# Patient Record
Sex: Female | Born: 1950 | Race: Black or African American | Hispanic: No | Marital: Married | Smoking: Former smoker
Health system: Southern US, Community
[De-identification: ages and names within clinical notes are randomized; demographics above are authoritative.]

## PROBLEM LIST (undated history)

## (undated) DIAGNOSIS — I2510 Atherosclerotic heart disease of native coronary artery without angina pectoris: Secondary | ICD-10-CM

## (undated) DIAGNOSIS — F0390 Unspecified dementia without behavioral disturbance: Secondary | ICD-10-CM

## (undated) DIAGNOSIS — I219 Acute myocardial infarction, unspecified: Secondary | ICD-10-CM

## (undated) DIAGNOSIS — J40 Bronchitis, not specified as acute or chronic: Secondary | ICD-10-CM

## (undated) DIAGNOSIS — K219 Gastro-esophageal reflux disease without esophagitis: Secondary | ICD-10-CM

## (undated) DIAGNOSIS — E785 Hyperlipidemia, unspecified: Secondary | ICD-10-CM

## (undated) DIAGNOSIS — Z87898 Personal history of other specified conditions: Secondary | ICD-10-CM

## (undated) DIAGNOSIS — M1990 Unspecified osteoarthritis, unspecified site: Secondary | ICD-10-CM

## (undated) DIAGNOSIS — I214 Non-ST elevation (NSTEMI) myocardial infarction: Secondary | ICD-10-CM

## (undated) DIAGNOSIS — I119 Hypertensive heart disease without heart failure: Secondary | ICD-10-CM

## (undated) DIAGNOSIS — N189 Chronic kidney disease, unspecified: Secondary | ICD-10-CM

## (undated) DIAGNOSIS — F329 Major depressive disorder, single episode, unspecified: Secondary | ICD-10-CM

## (undated) DIAGNOSIS — I10 Essential (primary) hypertension: Secondary | ICD-10-CM

## (undated) DIAGNOSIS — J189 Pneumonia, unspecified organism: Secondary | ICD-10-CM

## (undated) DIAGNOSIS — F32A Depression, unspecified: Secondary | ICD-10-CM

## (undated) DIAGNOSIS — N183 Chronic kidney disease, stage 3 unspecified: Secondary | ICD-10-CM

## (undated) DIAGNOSIS — F419 Anxiety disorder, unspecified: Secondary | ICD-10-CM

## (undated) DIAGNOSIS — I5032 Chronic diastolic (congestive) heart failure: Secondary | ICD-10-CM

## (undated) HISTORY — PX: KNEE ARTHROSCOPY: SHX127

## (undated) HISTORY — DX: Acute myocardial infarction, unspecified: I21.9

## (undated) HISTORY — PX: FOOT SURGERY: SHX648

## (undated) HISTORY — PX: HERNIA REPAIR: SHX51

## (undated) HISTORY — PX: HEMORRHOID SURGERY: SHX153

## (undated) HISTORY — PX: ABDOMINAL HYSTERECTOMY: SHX81

---

## 1992-05-01 HISTORY — PX: CARDIAC CATHETERIZATION: SHX172

## 1998-02-28 ENCOUNTER — Emergency Department (HOSPITAL_COMMUNITY): Admission: EM | Admit: 1998-02-28 | Discharge: 1998-02-28 | Payer: Self-pay | Admitting: Emergency Medicine

## 1998-03-03 ENCOUNTER — Inpatient Hospital Stay (HOSPITAL_COMMUNITY): Admission: EM | Admit: 1998-03-03 | Discharge: 1998-03-05 | Payer: Self-pay | Admitting: Emergency Medicine

## 1998-03-03 ENCOUNTER — Encounter: Payer: Self-pay | Admitting: Emergency Medicine

## 1998-08-11 ENCOUNTER — Emergency Department (HOSPITAL_COMMUNITY): Admission: EM | Admit: 1998-08-11 | Discharge: 1998-08-11 | Payer: Self-pay | Admitting: Emergency Medicine

## 1998-08-12 ENCOUNTER — Encounter: Payer: Self-pay | Admitting: Family Medicine

## 1998-08-12 ENCOUNTER — Inpatient Hospital Stay (HOSPITAL_COMMUNITY): Admission: EM | Admit: 1998-08-12 | Discharge: 1998-08-14 | Payer: Self-pay | Admitting: Family Medicine

## 1998-08-13 ENCOUNTER — Encounter: Payer: Self-pay | Admitting: Internal Medicine

## 1998-12-14 ENCOUNTER — Other Ambulatory Visit: Admission: RE | Admit: 1998-12-14 | Discharge: 1998-12-14 | Payer: Self-pay | Admitting: *Deleted

## 1999-03-23 ENCOUNTER — Encounter: Payer: Self-pay | Admitting: Emergency Medicine

## 1999-03-23 ENCOUNTER — Inpatient Hospital Stay (HOSPITAL_COMMUNITY): Admission: EM | Admit: 1999-03-23 | Discharge: 1999-03-26 | Payer: Self-pay | Admitting: Emergency Medicine

## 1999-03-25 ENCOUNTER — Encounter: Payer: Self-pay | Admitting: Family Medicine

## 1999-04-27 ENCOUNTER — Ambulatory Visit: Admission: RE | Admit: 1999-04-27 | Discharge: 1999-04-27 | Payer: Self-pay | Admitting: Pulmonary Disease

## 1999-07-14 ENCOUNTER — Inpatient Hospital Stay (HOSPITAL_COMMUNITY): Admission: EM | Admit: 1999-07-14 | Discharge: 1999-07-19 | Payer: Self-pay | Admitting: *Deleted

## 1999-07-14 ENCOUNTER — Encounter: Payer: Self-pay | Admitting: *Deleted

## 1999-11-23 ENCOUNTER — Ambulatory Visit (HOSPITAL_COMMUNITY): Admission: RE | Admit: 1999-11-23 | Discharge: 1999-11-23 | Payer: Self-pay | Admitting: Gastroenterology

## 1999-12-19 ENCOUNTER — Other Ambulatory Visit: Admission: RE | Admit: 1999-12-19 | Discharge: 1999-12-19 | Payer: Self-pay | Admitting: *Deleted

## 2000-07-17 ENCOUNTER — Encounter: Payer: Self-pay | Admitting: *Deleted

## 2000-07-17 ENCOUNTER — Inpatient Hospital Stay (HOSPITAL_COMMUNITY): Admission: EM | Admit: 2000-07-17 | Discharge: 2000-07-21 | Payer: Self-pay | Admitting: Emergency Medicine

## 2000-07-30 ENCOUNTER — Encounter: Payer: Self-pay | Admitting: Emergency Medicine

## 2000-07-30 ENCOUNTER — Inpatient Hospital Stay (HOSPITAL_COMMUNITY): Admission: EM | Admit: 2000-07-30 | Discharge: 2000-08-06 | Payer: Self-pay | Admitting: Emergency Medicine

## 2000-08-07 ENCOUNTER — Inpatient Hospital Stay (HOSPITAL_COMMUNITY): Admission: EM | Admit: 2000-08-07 | Discharge: 2000-08-10 | Payer: Self-pay | Admitting: *Deleted

## 2000-12-27 ENCOUNTER — Other Ambulatory Visit: Admission: RE | Admit: 2000-12-27 | Discharge: 2000-12-27 | Payer: Self-pay | Admitting: *Deleted

## 2001-10-15 ENCOUNTER — Inpatient Hospital Stay (HOSPITAL_COMMUNITY): Admission: EM | Admit: 2001-10-15 | Discharge: 2001-10-20 | Payer: Self-pay

## 2001-10-15 ENCOUNTER — Encounter: Payer: Self-pay | Admitting: Internal Medicine

## 2001-10-17 ENCOUNTER — Encounter: Payer: Self-pay | Admitting: Internal Medicine

## 2002-01-08 ENCOUNTER — Other Ambulatory Visit: Admission: RE | Admit: 2002-01-08 | Discharge: 2002-01-08 | Payer: Self-pay | Admitting: *Deleted

## 2002-01-19 ENCOUNTER — Inpatient Hospital Stay (HOSPITAL_COMMUNITY): Admission: EM | Admit: 2002-01-19 | Discharge: 2002-01-24 | Payer: Self-pay | Admitting: Emergency Medicine

## 2002-01-19 ENCOUNTER — Encounter: Payer: Self-pay | Admitting: Emergency Medicine

## 2002-02-11 ENCOUNTER — Ambulatory Visit (HOSPITAL_COMMUNITY): Admission: RE | Admit: 2002-02-11 | Discharge: 2002-02-11 | Payer: Self-pay | Admitting: *Deleted

## 2002-02-26 ENCOUNTER — Ambulatory Visit (HOSPITAL_BASED_OUTPATIENT_CLINIC_OR_DEPARTMENT_OTHER): Admission: RE | Admit: 2002-02-26 | Discharge: 2002-02-26 | Payer: Self-pay | Admitting: Family Medicine

## 2003-01-07 ENCOUNTER — Emergency Department (HOSPITAL_COMMUNITY): Admission: EM | Admit: 2003-01-07 | Discharge: 2003-01-07 | Payer: Self-pay | Admitting: Emergency Medicine

## 2003-01-07 ENCOUNTER — Encounter: Payer: Self-pay | Admitting: Emergency Medicine

## 2003-02-14 ENCOUNTER — Encounter: Payer: Self-pay | Admitting: Emergency Medicine

## 2003-02-14 ENCOUNTER — Inpatient Hospital Stay (HOSPITAL_COMMUNITY): Admission: EM | Admit: 2003-02-14 | Discharge: 2003-02-17 | Payer: Self-pay | Admitting: Emergency Medicine

## 2003-02-17 ENCOUNTER — Encounter: Payer: Self-pay | Admitting: Internal Medicine

## 2003-05-31 ENCOUNTER — Inpatient Hospital Stay (HOSPITAL_COMMUNITY): Admission: EM | Admit: 2003-05-31 | Discharge: 2003-06-13 | Payer: Self-pay | Admitting: Emergency Medicine

## 2003-06-12 ENCOUNTER — Encounter: Payer: Self-pay | Admitting: Pulmonary Disease

## 2004-02-25 ENCOUNTER — Ambulatory Visit (HOSPITAL_COMMUNITY): Admission: RE | Admit: 2004-02-25 | Discharge: 2004-02-25 | Payer: Self-pay | Admitting: General Surgery

## 2004-04-30 ENCOUNTER — Emergency Department (HOSPITAL_COMMUNITY): Admission: EM | Admit: 2004-04-30 | Discharge: 2004-04-30 | Payer: Self-pay | Admitting: Emergency Medicine

## 2005-11-09 ENCOUNTER — Emergency Department (HOSPITAL_COMMUNITY): Admission: EM | Admit: 2005-11-09 | Discharge: 2005-11-09 | Payer: Self-pay | Admitting: *Deleted

## 2006-09-16 ENCOUNTER — Inpatient Hospital Stay (HOSPITAL_COMMUNITY): Admission: EM | Admit: 2006-09-16 | Discharge: 2006-09-18 | Payer: Self-pay | Admitting: Emergency Medicine

## 2008-06-18 ENCOUNTER — Inpatient Hospital Stay (HOSPITAL_COMMUNITY): Admission: EM | Admit: 2008-06-18 | Discharge: 2008-06-24 | Payer: Self-pay | Admitting: Emergency Medicine

## 2008-07-03 ENCOUNTER — Ambulatory Visit: Payer: Self-pay | Admitting: Critical Care Medicine

## 2008-07-03 ENCOUNTER — Inpatient Hospital Stay (HOSPITAL_COMMUNITY): Admission: EM | Admit: 2008-07-03 | Discharge: 2008-07-09 | Payer: Self-pay | Admitting: Emergency Medicine

## 2008-07-21 DIAGNOSIS — F418 Other specified anxiety disorders: Secondary | ICD-10-CM | POA: Insufficient documentation

## 2008-07-21 DIAGNOSIS — IMO0002 Reserved for concepts with insufficient information to code with codable children: Secondary | ICD-10-CM | POA: Insufficient documentation

## 2008-07-21 DIAGNOSIS — E1149 Type 2 diabetes mellitus with other diabetic neurological complication: Secondary | ICD-10-CM | POA: Insufficient documentation

## 2008-07-21 DIAGNOSIS — I10 Essential (primary) hypertension: Secondary | ICD-10-CM | POA: Insufficient documentation

## 2008-07-21 DIAGNOSIS — E1165 Type 2 diabetes mellitus with hyperglycemia: Secondary | ICD-10-CM | POA: Insufficient documentation

## 2008-07-21 DIAGNOSIS — Z86718 Personal history of other venous thrombosis and embolism: Secondary | ICD-10-CM | POA: Insufficient documentation

## 2008-07-21 DIAGNOSIS — K219 Gastro-esophageal reflux disease without esophagitis: Secondary | ICD-10-CM | POA: Insufficient documentation

## 2008-07-21 DIAGNOSIS — J4530 Mild persistent asthma, uncomplicated: Secondary | ICD-10-CM | POA: Insufficient documentation

## 2008-07-21 DIAGNOSIS — E7800 Pure hypercholesterolemia, unspecified: Secondary | ICD-10-CM | POA: Insufficient documentation

## 2008-07-22 ENCOUNTER — Ambulatory Visit: Payer: Self-pay | Admitting: Pulmonary Disease

## 2008-07-22 DIAGNOSIS — J329 Chronic sinusitis, unspecified: Secondary | ICD-10-CM | POA: Insufficient documentation

## 2008-07-22 DIAGNOSIS — J309 Allergic rhinitis, unspecified: Secondary | ICD-10-CM | POA: Insufficient documentation

## 2008-07-22 DIAGNOSIS — J383 Other diseases of vocal cords: Secondary | ICD-10-CM | POA: Insufficient documentation

## 2008-07-22 DIAGNOSIS — R0602 Shortness of breath: Secondary | ICD-10-CM | POA: Insufficient documentation

## 2009-06-01 ENCOUNTER — Inpatient Hospital Stay: Payer: Self-pay | Admitting: Internal Medicine

## 2009-06-24 ENCOUNTER — Ambulatory Visit (HOSPITAL_COMMUNITY): Admission: RE | Admit: 2009-06-24 | Discharge: 2009-06-24 | Payer: Self-pay | Admitting: Cardiology

## 2009-06-24 ENCOUNTER — Encounter (INDEPENDENT_AMBULATORY_CARE_PROVIDER_SITE_OTHER): Payer: Self-pay | Admitting: Cardiology

## 2010-07-22 LAB — GLUCOSE, CAPILLARY: Glucose-Capillary: 100 mg/dL — ABNORMAL HIGH (ref 70–99)

## 2010-08-11 LAB — DIFFERENTIAL
Basophils Absolute: 0 10*3/uL (ref 0.0–0.1)
Basophils Relative: 0 % (ref 0–1)
Lymphocytes Relative: 13 % (ref 12–46)
Neutro Abs: 8.5 10*3/uL — ABNORMAL HIGH (ref 1.7–7.7)
Neutrophils Relative %: 81 % — ABNORMAL HIGH (ref 43–77)

## 2010-08-11 LAB — CBC
HCT: 32.7 % — ABNORMAL LOW (ref 36.0–46.0)
Hemoglobin: 10.9 g/dL — ABNORMAL LOW (ref 12.0–15.0)
MCHC: 33.2 g/dL (ref 30.0–36.0)
MCV: 96.2 fL (ref 78.0–100.0)
Platelets: 241 10*3/uL (ref 150–400)
RBC: 3.4 MIL/uL — ABNORMAL LOW (ref 3.87–5.11)
RDW: 14.3 % (ref 11.5–15.5)
WBC: 10.5 10*3/uL (ref 4.0–10.5)

## 2010-08-11 LAB — BASIC METABOLIC PANEL WITH GFR
BUN: 14 mg/dL (ref 6–23)
BUN: 18 mg/dL (ref 6–23)
CO2: 22 meq/L (ref 19–32)
CO2: 26 meq/L (ref 19–32)
Calcium: 7.2 mg/dL — ABNORMAL LOW (ref 8.4–10.5)
Calcium: 8.9 mg/dL (ref 8.4–10.5)
Chloride: 101 meq/L (ref 96–112)
Chloride: 108 meq/L (ref 96–112)
Creatinine, Ser: 0.91 mg/dL (ref 0.4–1.2)
Creatinine, Ser: 1 mg/dL (ref 0.4–1.2)
GFR calc non Af Amer: 57 mL/min — ABNORMAL LOW
GFR calc non Af Amer: >60 mL/min
Glucose, Bld: 142 mg/dL — ABNORMAL HIGH (ref 70–99)
Glucose, Bld: 170 mg/dL — ABNORMAL HIGH (ref 70–99)
Potassium: 3 meq/L — ABNORMAL LOW (ref 3.5–5.1)
Potassium: 4 meq/L (ref 3.5–5.1)
Sodium: 135 meq/L (ref 135–145)
Sodium: 138 meq/L (ref 135–145)

## 2010-08-11 LAB — GLUCOSE, CAPILLARY
Glucose-Capillary: 106 mg/dL — ABNORMAL HIGH (ref 70–99)
Glucose-Capillary: 141 mg/dL — ABNORMAL HIGH (ref 70–99)
Glucose-Capillary: 142 mg/dL — ABNORMAL HIGH (ref 70–99)
Glucose-Capillary: 151 mg/dL — ABNORMAL HIGH (ref 70–99)
Glucose-Capillary: 157 mg/dL — ABNORMAL HIGH (ref 70–99)
Glucose-Capillary: 161 mg/dL — ABNORMAL HIGH (ref 70–99)
Glucose-Capillary: 196 mg/dL — ABNORMAL HIGH (ref 70–99)
Glucose-Capillary: 265 mg/dL — ABNORMAL HIGH (ref 70–99)
Glucose-Capillary: 98 mg/dL (ref 70–99)

## 2010-08-11 LAB — CK TOTAL AND CKMB (NOT AT ARMC)
CK, MB: 1.2 ng/mL (ref 0.3–4.0)
Relative Index: INVALID (ref 0.0–2.5)
Total CK: 33 U/L (ref 7–177)

## 2010-08-11 LAB — BASIC METABOLIC PANEL
BUN: 22 mg/dL (ref 6–23)
BUN: 22 mg/dL (ref 6–23)
BUN: 27 mg/dL — ABNORMAL HIGH (ref 6–23)
CO2: 27 mEq/L (ref 19–32)
CO2: 27 mEq/L (ref 19–32)
Calcium: 8.6 mg/dL (ref 8.4–10.5)
Chloride: 100 mEq/L (ref 96–112)
Chloride: 102 mEq/L (ref 96–112)
Chloride: 98 mEq/L (ref 96–112)
Creatinine, Ser: 0.92 mg/dL (ref 0.4–1.2)
Creatinine, Ser: 0.93 mg/dL (ref 0.4–1.2)
GFR calc Af Amer: >60 mL/min (ref >60)
GFR calc non Af Amer: >60 mL/min (ref >60)
Glucose, Bld: 120 mg/dL — ABNORMAL HIGH (ref 70–99)
Potassium: 3.5 mEq/L (ref 3.5–5.1)
Potassium: 3.9 mEq/L (ref 3.5–5.1)
Sodium: 134 mEq/L — ABNORMAL LOW (ref 135–145)

## 2010-08-11 LAB — BLOOD GAS, ARTERIAL
Acid-Base Excess: 0.8 mmol/L (ref 0.0–2.0)
Bicarbonate: 26.3 meq/L — ABNORMAL HIGH (ref 20.0–24.0)
Drawn by: 235321
FIO2: 0.21 %
O2 Saturation: 66.2 %
Patient temperature: 98.6
TCO2: 23.7 mmol/L (ref 0–100)
pCO2 arterial: 47.8 mmHg — ABNORMAL HIGH (ref 35.0–45.0)
pH, Arterial: 7.36 (ref 7.350–7.400)
pO2, Arterial: 36.9 mmHg — CL (ref 80.0–100.0)

## 2010-08-11 LAB — CULTURE, BLOOD (ROUTINE X 2)

## 2010-08-11 LAB — BRAIN NATRIURETIC PEPTIDE: Pro B Natriuretic peptide (BNP): <30 pg/mL (ref 0.0–100.0)

## 2010-08-16 LAB — CBC
Platelets: 274 10*3/uL (ref 150–400)
RDW: 13 % (ref 11.5–15.5)
WBC: 6 10*3/uL (ref 4.0–10.5)

## 2010-08-16 LAB — GLUCOSE, CAPILLARY
Glucose-Capillary: 140 mg/dL — ABNORMAL HIGH (ref 70–99)
Glucose-Capillary: 141 mg/dL — ABNORMAL HIGH (ref 70–99)
Glucose-Capillary: 144 mg/dL — ABNORMAL HIGH (ref 70–99)
Glucose-Capillary: 150 mg/dL — ABNORMAL HIGH (ref 70–99)
Glucose-Capillary: 154 mg/dL — ABNORMAL HIGH (ref 70–99)
Glucose-Capillary: 168 mg/dL — ABNORMAL HIGH (ref 70–99)
Glucose-Capillary: 172 mg/dL — ABNORMAL HIGH (ref 70–99)
Glucose-Capillary: 178 mg/dL — ABNORMAL HIGH (ref 70–99)
Glucose-Capillary: 180 mg/dL — ABNORMAL HIGH (ref 70–99)
Glucose-Capillary: 197 mg/dL — ABNORMAL HIGH (ref 70–99)
Glucose-Capillary: 216 mg/dL — ABNORMAL HIGH (ref 70–99)

## 2010-08-16 LAB — POCT I-STAT 3, ART BLOOD GAS (G3+)
Patient temperature: 97
pH, Arterial: 7.472 — ABNORMAL HIGH (ref 7.350–7.400)
pO2, Arterial: 132 mmHg — ABNORMAL HIGH (ref 80.0–100.0)

## 2010-08-16 LAB — BRAIN NATRIURETIC PEPTIDE: Pro B Natriuretic peptide (BNP): <30 pg/mL (ref 0.0–100.0)

## 2010-08-16 LAB — BASIC METABOLIC PANEL
BUN: 15 mg/dL (ref 6–23)
BUN: 22 mg/dL (ref 6–23)
Chloride: 96 mEq/L (ref 96–112)
Creatinine, Ser: 0.85 mg/dL (ref 0.4–1.2)
Creatinine, Ser: 0.87 mg/dL (ref 0.4–1.2)
GFR calc Af Amer: >60 mL/min (ref >60)
GFR calc non Af Amer: >60 mL/min (ref >60)
Glucose, Bld: 154 mg/dL — ABNORMAL HIGH (ref 70–99)
Potassium: 3.8 mEq/L (ref 3.5–5.1)
Potassium: 4.1 mEq/L (ref 3.5–5.1)

## 2010-08-16 LAB — POCT I-STAT, CHEM 8
Calcium, Ion: 0.98 mmol/L — ABNORMAL LOW (ref 1.12–1.32)
Chloride: 106 mEq/L (ref 96–112)
HCT: 40 % (ref 36.0–46.0)
Potassium: 2.6 mEq/L — CL (ref 3.5–5.1)
Sodium: 140 mEq/L (ref 135–145)

## 2010-08-16 LAB — HEMOGLOBIN A1C: Hgb A1c MFr Bld: 5.9 % (ref 4.6–6.1)

## 2010-08-16 LAB — DIFFERENTIAL
Basophils Absolute: 0 10*3/uL (ref 0.0–0.1)
Lymphocytes Relative: 35 % (ref 12–46)
Lymphs Abs: 2.1 10*3/uL (ref 0.7–4.0)
Neutro Abs: 3.3 10*3/uL (ref 1.7–7.7)
Neutrophils Relative %: 55 % (ref 43–77)

## 2010-09-13 NOTE — Discharge Summary (Signed)
[name], [name]                ACCOUNT NO.:  [id number]   MEDICAL RECORD NO.:  [id number]          PATIENT TYPE:  INP   LOCATION:  5502                         FACILITY:  [facility]   PHYSICIAN:  Corinna L. Lendell Caprice, MDDATE OF BIRTH:  [birth year]   DATE OF ADMISSION:  06/18/2008  DATE OF DISCHARGE:  06/24/2008                               DISCHARGE SUMMARY   DISCHARGE DIAGNOSES:  1. Chronic obstructive pulmonary disease exacerbation secondary to      acute bronchitis.  2. Type 2 diabetes.  3. Hypertension.  4. Gastroesophageal reflux disease.  5. History of anxiety.   DISCHARGE MEDICATIONS:  1. Avelox 400 mg daily until gone.  2. Prednisone taper as directed.  3. Hycodan 5 mL every 4 hours as needed for cough.  4. Continue albuterol 2 puffs every 4 hours as needed.  5. Symbicort 2 puffs twice a day.  6. Diovan HCT 320/25 mg a day.  7. Xanax 0.5 mg nightly.  8. Pravastatin 40 mg a day.  9. Glimepiride 2 mg p.o. daily.  10.K-Dur 20 mEq a day.  11.Nexium 40 mg twice a day.  12.Estradiol 1 mg a day.   CONDITION:  Stable.   CONSULTATIONS:  None.   PROCEDURES:  None.   ACTIVITY:  Increase slowly.  She may return to work on July 01, 2008.   DIET:  Should be low-salt diabetic.   Follow up with Dr. Tiburcio Pea in 2-4 weeks or sooner if symptoms worsen.   LABORATORY DATA:  ABG showed a pH of 7.472, PCO2 of 31, PO2 of 132,  bicarbonate 23, and oxygen saturation 99%.  It is unclear whether this  was on supplemental oxygen but I assume so.  CBC unremarkable.  Basic  metabolic panel on admission 2.6.  Hemoglobin A1c 5.9.  BNP less than  30.  At discharge, her potassium was normalized.   SPECIAL STUDIES RADIOLOGY:  One view of the chest on admission showed  borderline cardiac size, nothing acute.  Esophagram showed no stricture.  Two views of the chest on February 22 showed density at the right upper  lobe.  CT of the chest without contrast showed no focal lesion to  account for  density on chest x-ray, nothing acute, minimal dependent  atelectasis.   HISTORY AND HOSPITAL COURSE:  [name] is a pleasant 60 year old black  female patient of Dr. Tiburcio Pea who previously was a smoker.  She presented  with cough and shortness of breath.  She came to the emergency room and  was given nebulizers and IV steroids.  She continued to wheeze.  She had  a history of intubation in the past.  She has quit smoking since that  time.  She was afebrile on admission and had a blood pressure of 156/97,  respiratory rate 22, and oxygen saturation 100% on 2 liters.  She was in  mild respiratory distress.  She had bilateral wheezing throughout and  trace pitting edema.  She was admitted and started on IV steroids and  nebulizers.  She was very slow to improve.  She continued to have a  nonproductive  hacking cough which was quite severe.  She continued to  have rhonchi and wheeze and was therefore started on antibiotics and a  repeat chest x-ray was done.  I suspect this exacerbation is secondary  to acute bronchitis.  After the Avelox was started, she finally started  to improve.  At the time of discharge, she continues to have coarse  breath sounds, but no wheeze, rhonchi, or rales.  She is able to  ambulate throughout the unit slowly but with normal oxygen saturations.  She is feeling much better with normal vital signs and is stable to go  home.  Total time on the day of discharge is 45 minutes.      Corinna L. Lendell Caprice, MD  Electronically Signed     CLS/MEDQ  D:  06/24/2008  T:  06/24/2008  Job:  [id number]   cc:   Holley Bouche, M.D.

## 2010-09-13 NOTE — H&P (Signed)
[name], [name] NO.:  [id number]   MEDICAL RECORD NO.:  [id number]          [name] TYPE:  EMS   LOCATION:  MAJO                         FACILITY:  [facility]   PHYSICIAN:  Andres Shad. Rudean Curt, MD     DATE OF BIRTH:  May 07, 1950   DATE OF ADMISSION:  09/16/2006  DATE OF DISCHARGE:                              HISTORY & PHYSICAL   CHIEF COMPLAINT:  Shortness of breath and cough.   HISTORY OF PRESENT ILLNESS:  [name] is a 60 year old female who  presented to [facility] emergency department complaining of shortness of  breath, cough and wheezing -- worsening over Jacqueline last 24 hours.  She is  a known asthmatic and has been developing an asthma exacerbation since  approximately 4 days ago.  She attributes Jacqueline onset of her symptoms to  upper respiratory congestion and a sore throat, which she believes  either allergies or an upper respiratory infection.  She was doing  reasonably well until yesterday in [facility] evening, when she began to have  much worsening cough.  This morning she felt unable to breath, and came  to [facility] emergency department for further management.   REVIEW OF SYSTEMS:  CONSTITUTIONAL:  Negative for fever or chills.  Positive for upper respiratory symptoms.  Negative for gastrointestinal  symptoms.  Positive for chest discomfort with breathing.   PAST MEDICAL HISTORY:  1. Diabetes mellitus type 2.  2. Asthma.  3. High cholesterol.  4. Hypertension.  5. Gastroesophageal reflux disease.   HOME MEDICATIONS:  1. Amaryl 2 mg daily.  2. Aspirin 81 mg daily.  3. Diovan-HCT 160/25 mg daily.  4. Nexium, which has recently been changed to a different prothrombin      time pump inhibitor.  5. Potassium chloride 20 mEq daily.  6. Premarin 1.25 mg daily.  7. Xanax 0.5 mg as needed.  8. Advair 250/50 inhaler.  9. Lantus 20 units subcutaneously daily.  10.Albuterol MDI as needed.  11.Cholesterol medication, but [name] does not remember what it      is.  She  was recently on Vytorin, but that has been discontinued.   ALLERGIES:  NONE.   SOCIAL HISTORY:  [name] quit smoking 3 years ago, following a  severe asthma exacerbation that required an intensive care unit  admission.   PHYSICAL EXAMINATION:  VITAL SIGNS:  Temperature 97, pulse 118,  respiratory rate 30, blood pressure 150/59, oxygen saturation 99% on  nasal cannula.  GENERAL APPEARANCE:  [name] was alert and tachypneic.  She was able  to talk, but she became breathless while speaking long sentences.  HEENT:  Mucous membranes were moist.  LUNGS:  Expiration was slightly prolonged, rare wheezes were heard at  Jacqueline end of expiration.  Volume and air movement was good.  CARDIOVASCULAR: Tachycardic and hyperdynamic.  ABDOMEN:  Normal bowel sounds; soft, nontender and nondistended.  No  organomegaly.  EXTREMITIES:  Very mild trace pitting edema of Jacqueline anterior shins.   LABORATORY STUDIES/RADIOLOGY:  No laboratory studies or x-rays were  performed in [facility] emergency department.   EMERGENCY ROOM COURSE:  [name] received 100 mg of prednisone, 650  mg of Tylenol, and several albuterol nebulizer treatments.  She feels  considerably improved, compared with arrival; but she is still quite  short of breath.   ASSESSMENT AND PLAN:  1. STATUS ASTHMATICUS.  I will admit [name] to a monitored flow      bed because of her tachycardia.  She will get albuterol nebulizer      treatments every 2 hours as needed, as well as intravenous Solu-      Medrol.  2. DIABETES MELLITUS.  [name] takes Amaryl and Lantus.  I will      continue these.  She will certainly have episodes of hyperglycemia      related to her steroids.  Because of this, I will cover her with an      insulin sliding scale, and monitor her blood sugars.  3. HYPERTENSION.  I will continue [name]'s Diovan-HCT.  I will      not give IV fluids for now, because she is hypertensive.  But, if      she is unable to  eat well, I will put her on IV fluids.  4. GASTROESOPHAGEAL REFLUX DISEASE.  Protonix 40 mg daily.  5. HORMONE REPLACEMENT:  I will continue [name]'s Premarin.      Andres Shad. Rudean Curt, MD  Electronically Signed     PML/MEDQ  D:  09/16/2006  T:  09/16/2006  Job:  [id number]   cc:   Holley Bouche, M.D.

## 2010-09-13 NOTE — H&P (Signed)
NAMEFRANKIE, ZITO NO.:  [id number]   MEDICAL RECORD NO.:  [id number]          PATIENT TYPE:  EMS   LOCATION:  MAJO                         FACILITY:  [facility]   PHYSICIAN:  Hollice Espy, M.D.DATE OF BIRTH:  1951-01-29   DATE OF ADMISSION:  06/18/2008  DATE OF DISCHARGE:                              HISTORY & PHYSICAL   The patient's PCP is Dr. Leonides Sake..   CHIEF COMPLAINT:  Shortness of breath.   HISTORY OF PRESENT ILLNESS:  The patient is a 60 year old African-  American female with a past medical history of diabetes, hypertension,  and asthma who has been in relatively good health with a recent trip to  [place] but began this morning markedly wheezy and dyspneic, and  when she came into the emergency room she was found to be in a full  blown asthma exacerbation.  Labs were ordered on the patient. She was  found to have a normal BNP, potassium 2.6, but otherwise no other  remarkable labs.  Chest x-ray just showed borderline cardiac size.  No  evidence of any infiltrates or edematous change. The patient was given  multiple doses of Solu-Medrol and nebulizer treatments, and her  breathing improved somewhat but she looked to be still having  significant bilateral wheezing. It was thought best that she come in for  further evaluation and treatment, especially in the setting of a  previous asthma exacerbation requiring intubation approximately 4 years  ago. The patient herself states she is feeling a little bit better.  Her  breathing is slightly easier. She denies any headaches, vision changes  or dysphagia.  No chest pain although complains of some bilateral pain  when coughing more on the side at the base of her lungs.  She does  complain of wheezing and nonproductive cough and some shortness of  breath but worse with exertion.  She denies any abdominal pain.  No  hematuria, dysuria, constipation, diarrhea focal extremity numbness,  weakness, or  pain.   REVIEW OF SYSTEMS:  Otherwise negative.  The patient's past medical  history includes asthma, anxiety, hypertension and diabetes mellitus,  and depression and GERD.   MEDICATIONS:  She is on citalopram albuterol, aspirin, glyburide,  Diovan, Nexium, K-Dur, Premarin, Symbicort, Xanax, and pravastatin.   ALLERGIES:  She has no known drug allergies.   SOCIAL HISTORY:  She denies any current tobacco, heavy alcohol or drug  use.  She quit smoking 4 years ago.   FAMILY HISTORY:  Noncontributory.   PHYSICAL EXAMINATION:  VITALS:  On admission temperature 97, heart rate  85, blood pressure 156/97 up to 173/73, respirations 22, O2 sat 100% on  2 liters.  GENERAL:  She is alert and oriented x3 in some mild distress secondary  to shortness of breath.  HEENT:  Normocephalic, atraumatic.  Mucous membranes are moist.  She has  no carotid bruits.  HEART:  Regular rate and rhythm. S1-S2, soft 2/6 systolic ejection  murmur.  LUNGS:  She has bilateral wheezing throughout.  ABDOMEN:  Soft, nontender, nondistended.  Positive bowel sounds.  EXTREMITIES: No clubbing  or cyanosis. Trace pitting edema.   LAB WORK:  White count 6, H and H 13 and 38, MCV of 94, platelet count  274, no shift.  BNP less than 30. Sodium 140, potassium 2.6, chloride  106, BUN 12, creatinine 0.9, glucose 113.  ABG was done after the  patient received multiple breathing treatments, pH 7.47, pCO2 of 31, pO2  of 132, bicarb 23. This was on 5 liters O2. Chest x-ray again showed  borderline cardiac size but no evidence of any acute infiltrates or  edematous change.   ASSESSMENT AND PLAN:  1. Asthma exacerbation.  2. Diabetes mellitus.  3. Hypertension.  4. Anxiety.   Continue home meds.  IV Solu-Medrol. At this time no reason for  antibiotics.  No signs of infection.  Plan aggressive and nebulizer  treatment and hopefully will be able to reverse this in a short amount  of time.  Put on baseline Lantus for increased  CBG coverage from  steroids.      Hollice Espy, M.D.  Electronically Signed     SKK/MEDQ  D:  06/18/2008  T:  06/18/2008  Job:  [id number]   cc:   Holley Bouche, M.D.

## 2010-09-13 NOTE — H&P (Signed)
NAMEDAYANNE, YIU                ACCOUNT NO.:  [id number]   MEDICAL RECORD NO.:  [id number]          PATIENT TYPE:  INP   LOCATION:  1438                         [facility]ACILITY:  [facility]   PHYSICIAN:  Kela Millin, M.D.DATE OF BIRTH:  [birth year]   DATE OF ADMISSION:  07/03/2008  DATE OF DISCHARGE:                              HISTORY & PHYSICAL   PRIMARY CARE PHYSICIAN:  Dr. Leonides Sake.   CHIEF COMPLAINT:  Shortness of breath.   HISTORY OF PRESENT ILLNESS:  The patient is a pleasant 60 year old black  female with past medical history significant for COPD, type 2 diabetes  mellitus, hypertension, GERD, history of anxiety, who presents with the  above complaints.  It is noted that she was recently discharged from the  hospital on June 24, 2008, following treatment of COPD exacerbation.  She states that since going back home she really never felt that well.  She states that she has had nasal congestion and a postnasal drip and  also a nonproductive cough with pleuritic chest pain.  She denies  fevers, dysuria, diarrhea and no melena.  She decided to return to work  just 2 days ago and when she did, she realized she was getting short of  breath with just minimal exertion, and she would get short of breath  sometimes with just talking.  On the day prior to admission, she states  she felt worse - as she was wheezing and more short of breath, and so  she went to see her primary care physician.  At that time, her  prednisone dose was increased, she was started on Singulair, Advair, as  well as Levaquin.  She tried to go back to work that day, but her  symptoms continued to worsen and so she was brought to the ED later.   She was given two nebulized breathing treatments in the ambulance and  also got three more treatments while in the ED, and was also given Solu-  Medrol, but she is still having some shortness of breath and so she is  admitted for further evaluation and management.   Chest x-ray done in the  ED shows no acute disease.  An ABG done in the ED was venous, but the  pCO2 is 47.8, her potassium is 3.0 with a white cell count of 10.5.  She  is admitted for further evaluation and management.   PAST MEDICAL HISTORY:  As above.   MEDICATIONS:  1. Albuterol MDI.  2. Amaryl 2 mg daily.  3. Aspirin.  4. Diovan/hydrochlorothiazide 320/25 mg daily.  5. Nexium 40 mg b.i.d.  6. KCL 20 mEq.  7. Symbicort 2 puffs b.i.d.  8. Premarin.  9. Prednisone taper.  10.Singulair  11.Advair  12.Levaquin  13.Xanax 0.5 mg daily.  14.Pravastatin 40 mg daily.   ALLERGIES:  NKDA.   SOCIAL HISTORY:  She quit tobacco in 2005, she denies alcohol.   FAMILY HISTORY:  One of her brother's had an MI in his 107s.   REVIEW OF SYSTEMS:  As per HPI, other review of systems negative.   PHYSICAL EXAMINATION:  GENERAL:  The patient is a pleasant older black  female.  She is alert and oriented, no accessory muscle use.  VITAL SIGNS:  Her temperature is 97.8, current blood pressure is 186/92,  initially 214/67, pulse of 116, respiratory rate of 18.  O2 saturations  100%.  HEENT:  PERRL, EOMI.  Sclerae anicteric.  Moist mucous membranes and no  oral exudates.  She has exquisite tenderness over her left maxillary  sinus.  NECK:  Supple.  No adenopathy, no thyromegaly and no JVD.  LUNGS:  Decreased breath sounds bilaterally.  No wheezes and no  crackles.  CARDIOVASCULAR:  Regular, tachycardiac, normal S1-S2 and no S3  appreciated.  ABDOMEN:  Soft, bowel sounds present.  Nontender, nondistended.  No  organomegaly and no masses palpable.  EXTREMITIES:  No cyanosis and no edema.  NEURO:  She is alert and oriented x3.  Cranial nerves II-XII grossly  intact.  Nonfocal exam.   LABORATORY DATA:  As per HPI.  Also, her hemoglobin is 10.9 with a  hematocrit 32.7, platelet count of 241.  Her MCV is 96.2.  Sodium is 138  with potassium of 3.0, chloride 108, CO2 of 22, glucose 142, BUN 14,   creatinine 1, calcium 7.2.  Her brain natriuretic peptide is less than  30.   ASSESSMENT AND PLAN:  1. Chronic obstructive pulmonary disease exacerbation - as discussed      above, will continue nebulized bronchodilators, steroids,      antibiotics and supplemental oxygen.  She possibly has sinusitis      which is the precipitating factor - will obtain a CT scan of her      sinuses to further evaluate, and add antihistamines.  Also will      check cardiac enzymes and follow and treat accordingly.  2. Uncontrolled hypertension - steroids likely aggravating factor.  We      will continue outpatient medications.  Also add IV Vasotec as      needed and follow.  3. Hypokalemia - replace potassium.  4. Hypocalcemia - will check albumin, follow and recheck.  5. Diabetes mellitus - continue Amaryl, monitor Accu-Cheks and cover      with sliding scale.  6. Gastroesophageal reflux disease - will continue proton pump      inhibitor.  7. History of anxiety - will continue Xanax.      Kela Millin, M.D.  Electronically Signed     ACV/MEDQ  D:  07/04/2008  T:  07/04/2008  Job:  [id number]   cc:   Holley Bouche, M.D.  Fax: [phone]

## 2010-09-13 NOTE — Consult Note (Signed)
[name], [name]                ACCOUNT NO.:  [id number]   MEDICAL RECORD NO.:  [id number]          PATIENT TYPE:  INP   LOCATION:  1438                         FACILITY:  [facility]   PHYSICIAN:  Charlcie Cradle. Delford Field, MD, FCCPDATE OF BIRTH:  [birth year]   DATE OF CONSULTATION:  DATE OF DISCHARGE:                                 CONSULTATION   CHIEF COMPLAINT:  Persistent cough and dyspnea.   HISTORY OF PRESENT ILLNESS:  A 60 year old African American female with  history of chronic obstructive airways disease.  We do not have  previous known recorded numbers on this patient, but she was admitted on  the 5th because of COPD exacerbation, asthmatic bronchitis.  She is an  ex-smoker since 2005 last seen by Dr. Sung Amabile in his office in February  2005.  Has not had follow-up in this office since that time.  The  patient did finally quit smoking in 2005, and has been managed on  Symbicort and Singulair over the past several years.  Also maintains  Diovan HCTZ for blood pressure control and Nexium b.i.d. for reflux.  She has noted increasing shortness of breath and cough and acute  bronchitic spell in February.  She was admitted in February, discharged  on the 24th, but still had sinus pressure and drainage and increased  cough.  Got progressively worse and was readmitted on July 03, 2008 to  the hospitalist service.  The patient has had a CT scan of sinus which  does show acute sinusitis.  She has had systemic steroids,  bronchodilators, empiric antibiotics, aggressive reflux therapy.  Despite all this she continues to cough and is complaining, and thus  pulmonary is called.   PAST MEDICAL HISTORY:  1. Medical history of diabetes.  2. Hypertension.  3. Reflux.  4. Anxiety.  5. COPD.   MEDICATIONS PRIOR TO ADMISSION:  Albuterol, Amaryl, aspirin, Symbicort,  Diovan, HCTZ, Nexium, Premarin, prednisone, Singulair, Xanax,  pravastatin.   She does have an allergy to ACE INHIBITORS during  this hospitalization.  Vasotec IV has been ordered, but it does not appear the patient has  actually ever received a dose of this medication.   SOCIAL HISTORY:  Stopped smoking in 2005.   FAMILY HISTORY:  Positive for heart disease.   REVIEW OF SYSTEMS:  Was taken in detail, and there are no other  pertinent positives.   PHYSICAL EXAMINATION:  Temp 98, blood pressure 146/85, pulse 81,  respirations 20, saturation 100% on room air.  CHEST:  Showed inspiratory and expiratory wheeze, poor air flow.  Upper  airway shows prominent pseudowheeze as well.  There is posterior  pharyngeal inflammation seen and drainage seen.  Nasopharynx is  inflamed.  ABDOMEN:  Soft, nontender.  EXTREMITIES:  Showed no edema, clubbing or venous disease.  SKIN:  Clear.  NEUROLOGIC:  Intact.   LABORATORY DATA:  Sinus CT shows ethmoid sinusitis and mild maxillary  sinusitis.  Chest x-ray showed no active disease process.   LABORATORY DATA:  Hemoglobin is 10.9, white count 12,000.  Sodium 134,  potassium 3.5, chloride 98, CO2 26, BUN 22, creatinine  0.9. Blood sugar  98.  BNP less than 30.   IMPRESSION:  Asthmatic bronchitis, underlying chronic obstructive  airways disease with ongoing exacerbation by sinus disease and reflux  and also a component of vocal cord dysfunction syndrome.   PLAN:  Add nasal hygiene with saline wash, Afrin, chlorpheniramine, and  nasal steroids.  Will complete a 14-day course of antibiotics and switch  to Avelox.  Discontinue further Vasotec.  Discontinue Diovan in the off  chance ARBs are contributing.  Note patient is getting Benicar as a  substitute here in the hospital again.  Stop the Benicar and give  clonidine for blood pressure control on top of hydrochlorothiazide.  Switch Nexium to Zegerid for sodium bicarbonate component, and give this  b.i.d.  Continue Singulair.  Discontinue further Advair and Symbicort as  this may be aggravating the upper airway.  Give cough  suppression  control.      Charlcie Cradle Delford Field, MD, [facility]  Electronically Signed     PEW/MEDQ  D:  07/07/2008  T:  07/08/2008  Job:  [id number]   cc:   Holley Bouche, M.D.  Fax: [phone]   [facility]

## 2010-09-16 NOTE — H&P (Signed)
NAME:  Jacqueline, [name]                          ACCOUNT NO.:  [id number]   MEDICAL RECORD NO.:  [id number]                   PATIENT TYPE:  INP   LOCATION:  5024                                 FACILITY:  [facility]   PHYSICIAN:  Elana Alm. Nicholos Johns, M.D.               DATE OF BIRTH:  1950/08/28   DATE OF ADMISSION:  01/19/2002  DATE OF DISCHARGE:                                HISTORY & PHYSICAL   CHIEF COMPLAINT:  This 60 year old female presents with a chief complaint of  progressively worsening wheezing and shortness of breath despite outpatient  therapy.   HISTORY OF PRESENT ILLNESS:  This 60 year old black female presents with  known history of multiple prior admissions for acute asthma exacerbation,  presents with complaints of wheezing and shortness of breath.  The patient  states she was last well until 01/13/02, when she developed shortness of  breath and nasal congestion.  By 01/15/02, two days later, she is  experiencing wheezing and worsening shortness of breath.  She was seen by my  partner, Dr. Leonides Sake, who provides primary care for the patient on an  ongoing basis on 01/16/02.  At that time, he determined that she had a  respiratory illness with reversible airway disease and placed her on Tequin  and a prednisone taper.  The patient was already on and compliant with  Advair 250/50 one puff b.i.d. and Singular 10 mg one p.o. q.d.  The patient  denied any fever or chills, but occasionally felt cold in the emergency  room.  She admitted to fatigue secondary to poor sleep habits since she has  been coughing.  She presents today for evaluation.   PAST MEDICAL HISTORY:  1. High blood pressure, controlled.  2. Gastroesophageal reflux disease, controlled.  3. Hypercholesterolemia, under treatment.  4. History of deep vein thrombosis.  5. Tobacco dependence despite multiple attempts to discontinue smoking.   PAST SURGICAL HISTORY:  1. Status post total abdominal hysterectomy  and bilateral salpingo-     oophorectomy secondary to fibroids.  2. Status post hemorrhoidectomy.  3. Status post umbilical hernia repair.  4. Bilateral Morton's neuroma removal.   FAMILY HISTORY:  Positive for sister with diabetes.  The patient's father  died secondary to complications from diabetes and congestive heart failure.  The patient's mother had asthma, as did another sister and niece.  The  patient's mother died of Alzheimer's disease.  The patient also has a sister  with VSD.   SOCIAL HISTORY:  The patient admits to smoking five cigarettes per day.  She  denies use of alcohol, and is not using any illicit drugs.  She lives here  in [place] with her husband.   REVIEW OF SYMPTOMS:  CONSTITUTIONAL:  Admits to fatigue.  HEAD:  Admits to  some headaches.  EYES:  Denies any blurred vision.  NECK:  Denies any  stiffness.  LUNGS:  Admits to  cough, occasionally productive of yellowish  phlegm.  CHEST:  Admits to mild dull chest pain that is worse with deep  inhalation.  ABDOMEN:  Denies any nausea, vomiting, diarrhea, or  constipation.  States that reflux is controlled with Nexium.  SKIN:  Denies  any rashes or lesions.  NEUROLOGIC:  Denies any neurological symptoms.   CURRENT MEDICATIONS:  1. Nexium 40 mg one p.o. q.d.  2. Diovan HCT 160/25 mg one p.o. q.d.  3. Singular 10 mg one p.o. q.d.  4. Zocor 40 mg one p.o. q.d.  5. Advair 250/50 one puff q.12h.  6. Albuterol metered-dose inhaler 2 puffs q.4-6h. p.r.n.   ALLERGIES:  No known drug allergies.   PHYSICAL EXAMINATION:  VITAL SIGNS:  Blood pressure 135/74, pulse 101,  respiratory rate 22 and non-labored, and temperature 98.5.  Pulse oximetry  readings ranged between 96 and 100% on room air.  GENERAL:  The patient appeared to be a middle-aged black female who is  coughing frequently with audible wheezing upon entering her room.  Oriented  x4, pleasant, and cooperative.  HEENT:  Head is normocephalic, atraumatic.  Eyes  are pupils equal, round,  reactive to light and accommodation.  Extraocular movements were intact.  Fundi are benign.  Ears clear, nose clear.  Throat negative.  NECK:  Supple, full range of motion without thyromegaly or adenopathy.  LUNGS:  Wheezing on expiration throughout all lung fields.  Scattered rales  noted as well.  No rhonchi present.  Good expiratory and inspiratory effort  noted.  HEART:  Regular rate and rhythm without murmurs, rubs, or gallops.  S1 and  S2 within normal limits with normal pulses at the radial regions.  No  evidence of JVD.  ABDOMEN:  Soft, nondistended, normal bowel sounds, no masses or organomegaly  appreciated, no rebound or guarding noted.  GENITOURINARY/RECTAL:  Not indicated for this hospitalization.  SKIN:  Warm and dry, no rashes or lesions present.  EXTREMITIES:  Full range of motion, no edema, no cyanosis present.  NEUROLOGIC:  Intact.   LABORATORY DATA:  Labs pending at this time.   Admission chest x-ray was read as showing no acute disease.   IMPRESSION:  1. Acute exacerbation of asthma unresponsive to outpatient treatment.  2. Tobacco dependence.  3. Gastroesophageal reflux, controlled.  4. High blood pressure, controlled.  5. Hypercholesterolemia, under treatment.  6. History of deep vein thrombosis.  7. Rule out acute bronchitis, atypicals unresponsive.   PLAN:  1. Admit.  2. Nebulizers with albuterol and Atropine q.4h.  3. Two liters of O2 via nasal prongs, checking pulse oximetry and peak flows     regularly.  4. IV Solu-Medrol.  5. Strongly recommend the patient discontinue smoking.  6.     Tequin one p.o. q.d.  7. Continue outpatient medications with the exception of Advair while the     patient is on albuterol nebulizers.  8. [facility] to pick up the patient and follow up again tomorrow     morning.                                                  Robert A. Nicholos Johns, M.D.   RAR/MEDQ  D:  01/19/2002  T:   01/21/2002  Job:  [id number]

## 2010-09-16 NOTE — H&P (Signed)
[place]  Patient:    [name], [name]                         MRN: [id number] Adm. Date:  07/17/00 Attending:  Amada Jupiter T. Dreiling, M.D.                         History and Physical  CHIEF COMPLAINT: Shortness of breath.  HISTORY OF PRESENT ILLNESS: This patient is a 60 year old black female who was in her normal state of good health until two days ago.  She developed a mild sore throat which has gotten progressively worse with a flare-up of her asthma, which got significantly worse today.  She has had some chills but no fever.  Her throat has only been mildly bothering her and her ears have not been bothering her.  Her last flare-up was about a year ago and actually she required hospitalization with that occurrence.  She had been on Advair and because she was doing so well she stopped this.  She tried albuterol inhaler with little to no results today.  PAST SURGICAL HISTORY:  1. Hysterectomy.  2. Bilateral tubal ligation.  3. Hemorrhoidectomy.  4. Umbilical hernia repair.  5. Mortons neuroma surgery on both feet.  PAST MEDICAL HISTORY:  1. Asthma.  2. GERD.  3. Hypertension.  4. Hypercholesterolemia.  5. DVT in distant past.  CURRENT MEDICATIONS:  1. Diovan 80 mg q.d.  2. Premarin 0.625 mg q.d.  3. Nexium 40 mg q.d.  4. Zocor 40 mg q.d.  ALLERGIES: Possible allergy to CODEINE.  SOCIAL HISTORY: She is married and has three children.  She smokes approximately half a packs of cigarettes per day and does not drink.  FAMILY HISTORY: Father deceased of congestive heart failure and he also had a history of diabetes.  Mother is living and has a history of hypertension and asthma.  The patient has a large family and there is a history of CAD, possible ASCAD, hypertension, and diabetes in her siblings.  REVIEW OF SYSTEMS: Noncontributory.  Last female examination was in August 2001 and was normal.  PHYSICAL EXAMINATION:  VITAL SIGNS:  Weight 150 pounds.  Blood pressure 130/88, pulse 92, respirations 30.  GENERAL: She is in moderate respiratory distress.  HEENT: Head normocephalic, atraumatic.  Ears ______ except for cerumen plug in left ear.  Throat without erythema or exudate.  NECK: Supple without lymphadenopathy or thyroid enlargement.  LUNGS: Decreased breath sounds moderately and wheezes throughout.  Expiratory versus greater than inspiratory phase.  No rales.  HEART: Regular rate and rhythm without murmurs, rubs, or gallops.  ABDOMEN: Some plus/minus 1 tenderness diffusely.  No masses or organomegaly. Felt like as a result of all the coughing.  EXTREMITIES: Without cyanosis or edema.  BACK: Spine showed some plus/minus 1 tenderness starting at L1 and progressing to +2 tenderness at L3 and down to S2.  This is true in the paraspinal muscles in the L2 to S2 area.  ASSESSMENT/PLAN:  1. Two nebulizer treatments were given in the office with little to no     improvement.  Impression is acute asthma.  She stopped use of Advair and I     think this is the reason she flared up.  We will give her IV steroids and     nebulizers and follow her oxygen saturations.  2. For back pain she will use some heat to some back and  rest.  3. Hypertension, being controlled on Diovan.  4. Gastroesophageal reflux disease, controlled on Nexium.  5. Hypercholesterolemia, on Zocor. DD:  07/17/00 TD:  07/18/00 Job: [id number]

## 2010-09-16 NOTE — Discharge Summary (Signed)
NAME:  [name], [name]                          ACCOUNT NO.:  [id number]   MEDICAL RECORD NO.:  [id number]                   PATIENT TYPE:  INP   LOCATION:  5702                                 FACILITY:  [facility]   PHYSICIAN:  Kela Millin, M.D.             DATE OF BIRTH:  05-02-1950   DATE OF ADMISSION:  05/31/2003  DATE OF DISCHARGE:  06/13/2003                                 DISCHARGE SUMMARY   DISCHARGE DIAGNOSES:  1. Chronic obstructive pulmonary disease exacerbation with asthmatic     bronchitis.  2. Steroid induced hyperglycemia.  3. Hypertension.  4. Gastroesophageal reflux disease, severe.  5. Hyperlipidemia.  6. Tobacco abuse.  7. Hypokalemia.  8. Vocal cord dysfunction syndrome.   CONSULTATIONS:  Pulmonology, Dr. Delford Field.   PROCEDURES:  1. Spirometry, done on June 12, 2003, read as moderate restriction with     diffusion mildly reduced, also mild obstructive airway disease with     response to bronchodilator.  2. CT scan of sinuses, showed mucosal thickening of the left ethmoidal and     bilateral maxillary sinuses.  It also showed mucosal thickening adjacent     to the left middle and inferior turbinates of the nasal cavities with     suspected partial blockage of the osteomeatal complex.  The lateral     recess of the sphenoid was also thickened focally.   HISTORY OF PRESENT ILLNESS:  The patient is a 60 year old black female with  past medical history significant for asthma and tobacco abuse who presented  with complaints of fever, muscle aches, shortness of breath, sinus  congestion, chills, sore throat and cough with wheezing for 2-3 days prior  to admission.  It was noted that the patient works at [employer] and had  many sick contacts there, and as stated above, she actively smokes.   PHYSICAL EXAMINATION:  VITAL SIGNS:  Upon admission, blood pressure 137/79,  O2 saturation 99% on room air and temperature of 100.7, respiratory rate 36  initially.  LUNGS:  Pertinent positives included that she had diffuse extra wheezes on  the lung exam.  CARDIOVASCULAR:  Tachycardic with no murmurs, gallops, rubs.  EXTREMITIES:  No cyanosis, clubbing or edema.   LABORATORY DATA:  ABG on admission showed pH of 7.399 with PCO2 43.  Hemoglobin 13, hematocrit 40.  Potassium 3.2.  She had 2 sets of cardiac  enzymes which were negative.  Urinalysis was negative.  EKG showed sinus  tachycardia.  Chest x-ray showed chronic parabronchial thickening with no  evidence of pneumonia.  There was basilar atelectasis.  Influenza A and B  tests were done and these were negative.   HOSPITAL COURSE:  #1 - CHRONIC OBSTRUCTIVE PULMONARY DISEASE WITH ASTHMATIC  BRONCHITIS.  The patient was admitted to the step-down unit with telemetry  monitoring and was started on Solu-Medrol, bronchodilators including Advair.  The patient was also started on IV antibiotics, Avelox.  Initially, her  diagnosis was status asthmaticus and with the above interventions, the  improvement in her symptoms was very slow.  Pulmonology was consulted upon  admission, and they recommended a CT scan of the sinuses, and the results  are as stated above.  The patient was also changed to Augmentin for better  sinus coverage and antihistamines were discontinued and nasal hygiene was  ordered, and the nebulizer treatments intensified, but Atrovent and Advair  were discontinued at that time by the pulmonologist.  Dr. Delford Field,  pulmonologist, also made the diagnosis of __________vocal cord dysfunction  syndrome, and speech pathology was consulted.  They evaluated the patient  and trained her on performance of low abdominal breathing and pursed lip  exhalation.  Also, provided a written handout of exercises of continued  practice.  They also recommended outpatient speech therapy and voice clinic  for her.  Spirometry testing was also done, and the results are as stated  above.  Following review of this  spirometry, Dr. Delford Field decided that the  patient had more COPD with asthmatic bronchitis.  The patient was also  treated with antitussive - Humibid.  She was also treated with Flonase and  nasal saline spray for her sinuses.  The pulmonologist followed the patient  throughout her hospital stay and they Advair was restarted and her  oxygenation gradually improved to where she was weaned of the oxygen and was  hemodynamically stable.  Prednisone was very gradually tapered during the  hospital stay.  The patient's symptoms gradually resolved, and she was  hemodynamically stable, oxygenating on room air at which point she was  discharged home to follow up with Dr. Delford Field on June 17, 2003, at 2:00  p.m.   #2 - STEROID INDUCED HYPERGLYCEMIA.  The patient's blood sugars were  elevated on the steroids, and Accu-Check's were done, and she was covered  with a sliding scale insulin, but subsequently, she was started on Lantus  for better control of the blood sugars.  The patient was to follow up with  Dr. Herminio Heads upon discharge, and her blood sugars were to be monitored  on the Lantus and treated as clinically with the possibility of  discontinuing the Lantus, since the prednisone was to be tapered off over  time.   #3 - HYPERTENSION.  The patient was treated with Avapro 300 mg daily while  she was in the hospital, but her blood pressures were poorly controlled,  likely secondary to the steroids, and so Hydralazine was added 25 mg twice a  day for better control.   #4 - GASTROESOPHAGEAL REFLUX DISEASE.  The patient was treated with  Nexium/Protonix while in the hospital.   #5 - HYPERLIPIDEMIA.  The patient was treated with Zocor 40 mg q.h.s.   #6 - HYPOKALEMIA.  Her potassium was repleted in the hospital.   #7 - VOCAL CORD DYSFUNCTION SYNDROME.  As above, the patient was seen by  speech therapy, and upon discharge was instructed to avoid excess talking and rest her voice, in  addition to the exercises mentioned above.   CONDITION ON DISCHARGE:  Improved.   DISCHARGE MEDICATIONS:  1. Advair 500/50 one puff b.i.d.  2. Risperdal 0.5 mg q.a.m. and 1 mg q.p.m.  3. Augment XR 2 g b.i.d.  4. Flonase 2 sprays in each nostril b.i.d.  5. Saline nasal spray t.i.d.  6. Humibid DM 2 b.i.d.  7. Nexium 40 mg b.i.d.  8. Prednisone taper as directed.  9. Hydrochlorothiazide 25 mg  1 p.o. daily.  10.      Lantus 20 units subcu at h.s.  11.      Hydralazine 25 mg 1 p.o. b.i.d.  12.      Avapro 300 mg 1 p.o. daily.  13.      Zocor 40 mg 1 p.o. q.h.s.   DIET:  A 2 g sodium, low-cholesterol, low-fat diet.   FOLLOWUP:  1. Patient to follow up with pulmonologist, Dr. Sung Amabile, on June 17, 2003, at 2:00 p.m.  2. Patient to follow up with Dr. Leonides Sake in 1-2 weeks.                                                Kela Millin, M.D.    ACV/MEDQ  D:  07/12/2003  T:  07/14/2003  Job:  [id number]   cc:   Holley Bouche, M.D.  [address]  [place], [place] [address]  Fax: [phone]

## 2010-09-16 NOTE — Discharge Summary (Signed)
NAME:  [name], [name]                          ACCOUNT NO.:  [id number]   MEDICAL RECORD NO.:  [id number]                   PATIENT TYPE:  INP   LOCATION:  3002                                 FACILITY:  [facility]   PHYSICIAN:  Deirdre Peer. Polite, M.D.              DATE OF BIRTH:  1950-12-29   DATE OF ADMISSION:  02/14/2003  DATE OF DISCHARGE:  02/17/2003                                 DISCHARGE SUMMARY   DISCHARGE DIAGNOSES:  1. Asthma exacerbation.  2. Persistent tobacco use.  3. Gastroesophageal reflux disease.  4. Hyperlipidemia.  5. Hypertension.  6. Borderline diabetes.   MEDICATIONS AT DISCHARGE:  Include:  1. Prednisone taper of eight days.  2. Combivent MDI two puffs q.6h.  3. Avalox 400 mg one daily.  4. Advair 250/50, one inhalation b.i.d.  5. Hydrochlorothiazide 25 mg every day.  6. Diovan 160 mg every day.  7. Nexium every day.  8. Zocor 40 mg every day.   CONSULTANTS:  None.   STUDIES:  Chest x-ray no apparent disease.   HISTORY OF PRESENT ILLNESS:  A 60 year old female with known history of  asthma and frequent exacerbations presented to the ED with complaints of  shortness of breath, chest tightness.  Exam revealed exertional wheeze with  no improvement with multiple neb treatments.  Admission was deemed necessary  for further evaluation and treatment.  Please see dictated H&P for further  details, history of present illness.   PAST MEDICAL HISTORY:  As stated above.   MEDICATIONS:  As stated above.   SOCIAL HISTORY:  Significant for continued tobacco use.   HOSPITAL COURSE:  The patient was admitted to a medicine floor bed for  evaluation and treatment of her asthma exacerbation.  The patient was  treated with antibiotics, O2, frequent nebs and IV steroids.  The patient  had a followup chest x-ray which was negative for pneumonia.  The patient  had slow resolution of her asthma exacerbation with the above treatments.  During this patient's  hospitalization, she had counseling again in reference  to her need to stop smoking but this time it is still not felt that the  patient is ready at this time.  The day of discharge, the patient is  medically stable with no expiratory wheeze, without O2 requirement.  At this  time, she is medically stable for discharge.  Thank you in advance.                                                Deirdre Peer. Polite, M.D.    RDP/MEDQ  D:  02/17/2003  T:  02/18/2003  Job:  [id number]   cc:   Holley Bouche, M.D.  [address]  [place], [place] [address]  Fax: [phone]

## 2010-09-16 NOTE — Procedures (Signed)
[facility]  Patient:    [name], [name]                       MRN: [id number] Proc. Date: 11/23/99 Adm. Date:  [id number] Disc. Date: [id number] Attending:  Rich Brave CC:         Arvella Merles, M.D.   Procedure Report  PROCEDURE:  Flexible sigmoidoscopy.  NOTE:  This is a repeat dictation. The original dictation, performed on a day when the computers were down and the medical record number was not available, apparently got lost.  FINDINGS:  Normal exam to 35 cm.  DESCRIPTION OF PROCEDURE:  The nature, purpose and risk of the procedure are familiar to the patient who provided written consent. This was performed immediately following her upper endoscopy and total sedation for the 2 pros was fentanyl 50 mcg and Versed 7 mg without arrhythmias or desaturations. The Olympus scope was advanced through about 35 cm and then withdrawn. No polyps, cancer, colitis, vascular malformations or diverticular disease were observed. No biopsies were obtained.  IMPRESSION:  Normal screening flex sig.  PLAN:  Follow-up screening flex sig in 5 years. DD:  12/12/99 TD:  12/13/99 Job: [id number]

## 2010-09-16 NOTE — Discharge Summary (Signed)
[facility]  Patient:    [name], [name] Visit Number: [id number] MRN: [id number]          Service Type: MED Location: 3W [id number] Attending Physician:  Jackie Plum Dictated by:   Jackie Plum, M.D. Admit Date:  10/15/2001 Discharge Date: 10/20/2001   CC:         Dr. Marina Goodell of [facility]   Discharge Summary  DISCHARGE DIAGNOSES: 1. Acute severe bronchial asthma exacerbation - resolving. 2. Uncontrolled hypertension - the patient will need outpatient followup of    her blood pressures. 3. Hypercholesterolemia. 4. Gastroesophageal reflux disease. 5. Hypokalemia resolved. 6. History of cigarette smoking. 7. History of depression. 8. History of pulmonary embolism. 9. Leukocytosis - likely secondary to steroid-induced leukocytosis.  The    patient will need outpatient followup of her leukocyte counts.  DISCHARGE MEDICATIONS:  The patient is going to continue her preadmission medications which include: Diovan/HCT 160/12.5, Nexium 40 mg daily, Advair 250/50 and Zocor 40 mg.  In addition, she is going to start these new medications: Combivent MDI two puffs q.i.d., Tussionex 5 mL q.12h. and Humibid LA 1200 mg p.o. q.12h.  She has also been started on prednisone tapering dose.  ACTIVITIES:  As tolerated.  DIET:  Low salt, low cholesterol diet.  SPECIAL INSTRUCTIONS:  The patient is to stop smoking cigarettes and she should check her blood pressure at her job [employer]) tomorrow evening and if the systolic blood pressure (that is upper number of her blood pressure) is more than 150 mmHg, she should take two tablets of the Diovan/HCT instead of one and notify her doctor.  FOLLOWUP:  Followup appointment will be with Dr. Marina Goodell in 1 week.  She is to call for appointment.  CONSULTANTS:  Not applicable.  PROCEDURES:  Not applicable.  CONDITION ON DISCHARGE:  Improved.  DISCHARGE LABORATORIES:  WBC count 17.5, hemoglobin 12.5,  hematocrit 36.4, MCV 94.0, platelet count 341.  Sodium 136, potassium 4.2, chloride 103, CO2 27, glucose 202, BUN 12, creatinine 0.9, calcium was 8.7, magnesium 2.5.  HISTORY OF PRESENT ILLNESS:  This 60 year old patient presented on October 15, 2001 with 3-day history of increasing shortness of breath despite increasing home nebulizer use and frequency.  She had developed sore throat and cough productive of yellowish sputum several days prior to presentation.  Admitting temperature was 96.8, pulse rate of 94 per minute, with respiratory rate of 30.  She was in moderate respiratory distress with expiratory wheezes. Cardiac exam was notable for mild tachycardia without any murmur or gallops. She did not have any extremity edema.  The patient is admitted for acute asthma exacerbation secondary to upper respiratory infection to rule out pneumonia.  HOSPITAL COURSE: #1 - ASTHMA EXACERBATION:  The patient was started on IV steroids and oxygen supplementation with bronchodilator nebulizer treatment.  She attained some relief but still was suboptimally controlled and was tight in the chest. X-ray did not reveal any acute infiltrate and therefore, her Tequin which had been started during admission was discontinued.  She received doses of IV magnesium sulfate without much improvement.  With continued bronchodilation and pulmonary toileting, the patients pulmonary status improved yesterday and she will be discharged today.  Her discharge O2 saturation on room air was 99% with a heart rate of 86 per minute.  Lung exam was notable for vesicular breath sounds which were adequate without any crackles.  She is not in any respiratory distress.  Cause of the asthma exacerbation is probably related to  her upper respiratory infection which is believed to be viral in etiology.  #2 - UNCONTROLLED HYPERTENSION:  The patient takes Diovan/HCT 160/12.5 at home for her hypertension.  Her blood pressure was  uncontrolled within 48 hours of discharge with systolics running as high as 190s with systolics of around 100 mmHg.  The patient overnight received doses of _________ by the on-call M.D. and was a bit drowsy this morning.  Her discharge BP is 158/90 mmHg.  She did not have any headaches or any visual changes.  She did not have any chest pain or shortness of breath.  She denies any palpitations, orthopnea, or paroxysmal nocturnal dyspnea, that is the patient is not clinically symptomatic with respect to the uncontrolled hypertension.  During hospitalization, the patient was on Avapro and hydrochlorothiazide and is being discharged home to continue her preadmission dose of Diovan/HCT tomorrow.  She was instructed to go to her workplace, that is [employer], where she can get her blood pressure checked and I have told her that should the blood pressure show a systolic of more than 150 mmHg she should call her M.D. and possibly take two doses of Diovan/HCT 160/12.5 for her blood pressure.  We believe that the high dose of steroids that the patient required during this admission will be contributory to her uncontrolled hypertension.  The patient will need outpatient evaluation of her blood pressure to normalization.  Her renal function is within normal limits on discharge.  #3 - CIGARETTE SMOKING:  The patient was initiated on nicotine patch during this hospitalization after discussing the effect of cigarette smoking on her general health being and her asthma.  We recommend outpatient reinforcement of cigarette cessation discussed with the patient.  I spent more than 30 minutes this afternoon going through the patients medications with her and discussing the etiology of her symptoms that may be related to high blood pressure and how to get appropriate followup prior to seeing her doctor.  She also received reinforced counseling on cigarette cessation past discharge this afternoon. Dictated by:    Jackie Plum, M.D.  Attending Physician:  Jackie Plum DD:  10/20/01 TD:  10/21/01 Job: [id number]

## 2010-09-16 NOTE — Discharge Summary (Signed)
NAMEMERRIE, [name]                ACCOUNT NO.:  [id number]   MEDICAL RECORD NO.:  [id number]          PATIENT TYPE:  INP   LOCATION:  1438                         FACILITY:  [facility]   PHYSICIAN:  Corinna L. Lendell Caprice, MDDATE OF BIRTH:  13-Feb-1951   DATE OF ADMISSION:  07/03/2008  DATE OF DISCHARGE:  07/09/2008                               DISCHARGE SUMMARY   DISCHARGE DIAGNOSES:  1. Dyspnea secondary to a combination of restrictive airway disease,      vocal cord dysfunction and acute sinusitis, pulmonary function      tests not consistent with chronic obstructive pulmonary disease or      asthmatic component.  2. Diabetes type 2.  3. Hypertension.  4. Gastroesophageal reflux disease.  5. History of anxiety.   DISCHARGE MEDICATIONS:  1. Change Amaryl to 4 mg daily while on prednisone.  2. Prednisone taper as directed.  3. Hydrochlorothiazide 25 mg a day.  4. Norvasc 10 mg a day.  5. Mucinex DM two tablets twice a day.  6. Tessalon Perles 200 mg t.i.d. until gone.  7. Flonase 2 sprays per nostril twice a day.  8. Stop Divan HCTZ.  9. Stop Levaquin.  10.May continue Hycodan 5 cc every 4 hours as needed.  11.Albuterol MDI 2 puffs q.4h. as needed.  12.Symbicort 2 puffs twice a day.  13.Xanax 0.5 mg nightly.  14.Pravastatin 40 mg a day.  15.K-Dur 20 mEq a day.  16.Nexium 40 mg a day.  17.Estradiol 1 mg a day.   CONDITION:  Stable.   ACTIVITY:  Ad lib.   FOLLOWUP:  1. Follow up with [facility] in 2-4 weeks.  2. Follow up with Dr. Leonides Sake in 2-4 weeks.   DIET:  Diet should be diabetic.   CONDITION:  Stable.   CONSULTATIONS:  Dr. Shan Levans.   PROCEDURES:  None.   LABORATORY DATA:  ABG on admission showed a pH of 7.360, pCO2 of 47.8,  pO2 of 36, I suspect this was a venous draw.  CBC on admission  significant for a hemoglobin of 10.9, hematocrit 32.7.  Basic metabolic  panel on admission significant for a potassium of 3.0, glucose 142; at  discharge  potassium of 3.5.  Cardiac enzymes negative.  BNP less than  30.  Blood cultures negative.   SPECIAL STUDIES/RADIOLOGY:  Chest x-ray one view on admission showed  nothing acute.  CT sinuses on July 03, 2008 showed mild to moderate  ethmoid air cell inflammatory changes with relative sparing of other  paranasal sinuses, but appearance does suggest acute sinusitis.  Repeat  CT sinus on July 07, 2008 was ordered by Dr. Danise Mina which showed  mild sphenoid chronic sinusitis with interval improvement since previous  study.  Pulmonary function tests showed no obstructive lung defect  indicated by the FEV-1 to FVC ratio which was 86, moderate restrictive  lung defect with moderate decrease in diffusing capacity, FEF 25-75,  change __________ 13%.  Insignificant response to bronchodilator.  Flow-  volume loop consistent with vocal cord dysfunction.   HISTORY AND HOSPITAL COURSE:  [name] is a  60 year old black female  who presented with shortness of breath, cough nonproductive.  She had a  previous diagnosis of COPD and had been admitted in February with acute  bronchitis and labeled COPD exacerbation.  She was very slow to recover  during that hospitalization.  She was readmitted with similar symptoms,  although not as severe.  She was again slow to respond.  She had been  started on sinusitis treatment.  It was felt that a component of her  symptoms were related to gastroesophageal reflux disease and sinusitis.  She has seen pulmonary in the past, and they were consulted.  Pulmonary  function tests actually were not consistent with an obstructive  component but more consistent with restrictive lung disease and  decreased diffusion capacity with vocal cord dysfunction.  Her  medications were adjusted, and at the time of discharge, the patient was  feeling better and stable for discharge.  Her blood pressure and blood  sugars remained elevated in part due to steroids.  Her medications  were  adjusted accordingly.  Also, Dr. Delford Field felt that the angiotensin  receptor blocker may be contributing to some of her symptoms and he  recommended that the Diovan be stopped.   Total time on the day of discharge 60 minutes.      Corinna L. Lendell Caprice, MD  Electronically Signed     CLS/MEDQ  D:  08/20/2008  T:  08/20/2008  Job:  [id number]   cc:   Charlcie Cradle. Delford Field, MD, FCCP  [address]  [place]  [place] [address]   Holley Bouche, M.D.  Fax: [phone]

## 2010-09-16 NOTE — Discharge Summary (Signed)
NAME:  [name], [name]                          ACCOUNT NO.:  [id number]   MEDICAL RECORD NO.:  [id number]                   PATIENT TYPE:  INP   LOCATION:  5024                                 FACILITY:  [facility]   PHYSICIAN:  Deirdre Peer. Polite, M.D.              DATE OF BIRTH:  1951-02-02   DATE OF ADMISSION:  01/19/2002  DATE OF DISCHARGE:  01/24/2002                                 DISCHARGE SUMMARY   ADDENDUM:  This patient was initially to be discharged on 01/21/2002.  Her  discharge was delayed due to continued expiratory wheezes on exam and  significant dyspnea with exertion.  On 01/22/2002, the patient continued to  be short of breath with moderate dyspnea on exertion.  Her p.o. steroids  were switched to IV.  Nebulizers were continued.  She did also have some  increased lower extremity edema with increased blood pressure.  She was  given a one-time dose of Lasix 20 mg p.o.  She did have isolated increase in  temperature to 99.1.   On the night of 01/22/2002, the patient had an episode of extreme anxiety  with shortness of breath that improved after respiratory treatment.  She  also had an episode of generalized itching that was treated with p.r.n.  Benadryl.   On 01/23/2002, she continued to have some shortness of breath.  Several  attempts were made to obtain a sputum culture without success.  She was  afebrile.  Pre-treatment peak flow was 200, post treatment peak flow was  230.  A Hemoglobin A1C was obtained due to increased CBG with the steroids.  Results revealed 6.1.  On 01/23/2002, the patient was noted to have a  significant component of upper airway wheezing which, when coached on her  breathing, there was almost complete resolution of her transmitted wheezing.  Her steroids were then changed to p.o.  Sodium was slightly decreased at  131, thought to be secondary to Lasix.  Leukocytosis was thought to be  secondary to steroid effects.   On 01/24/2002, the patient  reported feeling much better.  She reported  anxiety being better with Xanax.  CBGs were still running in the 400 range.  Her steroids have been changed to p.o. with a taper.   Over the next 8 days, the patient is instructed to watch carbohydrates while  on prednisone.  At discharge, she is being discharged with 7-day  instructions to use albuterol nebulizer for 7 days and then switch back to  albuterol MDI.   She has a followup appointment with Dr. Leonides Sake on 02/04/2002 at 10 a.m.  at which time a metabolic panel should be done to follow up on hyponatremia.   DISPOSITION:  The patient will be discharged home.     Stephanie Swaziland, N.P.                    Deirdre Peer. Polite,  M.D.    Denny Peon  D:  01/24/2002  T:  01/27/2002  Job:  [id number]   cc:   Holley Bouche, M.D.  [address]  [place], [place] [address]  Fax: [phone]

## 2010-09-16 NOTE — Op Note (Signed)
[name], [name]                          ACCOUNT NO.:  [id number]   MEDICAL RECORD NO.:  [id number]                   PATIENT TYPE:  AMB   LOCATION:  DAY                                  FACILITY:  [facility]   PHYSICIAN:  Almedia Balls. Fore, M.D.                DATE OF BIRTH:  [birth year]   DATE OF PROCEDURE:  02/11/2002  DATE OF DISCHARGE:                                 OPERATIVE REPORT   PREOPERATIVE DIAGNOSES:  Status post hysterectomy, bilateral salpingo-  oophorectomy, pelvic pain.   POSTOPERATIVE DIAGNOSES:  Status post hysterectomy, bilateral salpingo-  oophorectomy, pelvic pain. Extensive pelvic and abdominal adhesions.   OPERATION:  Diagnostic laparoscopy.   ANESTHESIA:  General oral tracheal.   SURGEON:  Almedia Balls. Randell Patient, M.D.   INDICATIONS FOR PROCEDURE:  The patient is a 60 year old with the above  noted preoperative diagnosis who was apprised of the need for surgery to  evaluate this problem and to possibly treat this problem. She was fully  counselled as to the nature of the procedure and the risks involved to  include risk of anesthesia, injury to bowel, bladder, blood vessels,  ureters, postoperative hemorrhage, infection and recuperation. She fully  understands all these considerations and wishes to proceed on February 11, 2002.   OPERATIVE FINDINGS:  On laparoscopy, the lower liver edge area of the  gallbladder were normal to visualization. In the lower abdomen and pelvis,  there were multiple loops of small intestine which were adherent to the  anterior peritoneal surface with dense adhesions. These adhesions persisted  to the left lateral aspect of the peritoneal cavity so that the spleen could  not be visualized. The actual pelvis could not be visualized as well because  of the density of the adhesions.   DESCRIPTION OF PROCEDURE:  With the patient under general anesthesia,  prepared and draped in the usual sterile fashion, after catheterization and  with  a sponge stick in the vagina, an incision was made in the lower pole of  the umbilicus. The Veress cannula was inserted into the peritoneal cavity  with insufflation of 3 liters of carbon dioxide. The disposable 10 mm trocar  for the operative laparoscope and the laparoscope itself were then inserted  into the peritoneal cavity. The above noted findings were visualized.  Because of the extent of the adhesions and the density of the adhesions and  the proximity of the bowel, it was felt that it would not be possible to  safely lyse these adhesions using laparoscopy and laparoscopic instruments.  Therefore, the procedure was terminated. Gas was allowed to fully escape,  and after noting that hemostasis was maintained and that sponge and  instrument counts were correct, then the incisions were closed with fascial  sutures of #0 Vicryl and subcuticular sutures of 3-0 plain catgut. Estimated  blood loss less than 25 ml. The patient was taken to the recovery  room in  good condition.    FOLLOWUP CARE:  She is to return to the office in two weeks for followup and  to call with unusual pain or fever should ensue. She was given a  prescription for Darvocet-N 100 or generic #15 to be 1/2 to 2 q. 4h p.r.n.  pain. She will call for any problems.                                                Almedia Balls. Randell Patient, M.D.    SRF/MEDQ  D:  02/11/2002  T:  02/11/2002  Job:  [id number]

## 2010-09-16 NOTE — H&P (Signed)
NAME:  [name], [name]                          ACCOUNT NO.:  [id number]   MEDICAL RECORD NO.:  [id number]                   PATIENT TYPE:  EMS   LOCATION:  MAJO                                 FACILITY:  [facility]   PHYSICIAN:  Sherin Quarry, MD                   DATE OF BIRTH:  1950-08-09   DATE OF ADMISSION:  02/14/2003  DATE OF DISCHARGE:                                HISTORY & PHYSICAL   PRIMARY CARE DOCTOR:  Dr. Holley Bouche.   CHIEF COMPLAINT:  Shortness of breath.   HISTORY OF PRESENT ILLNESS:  This is a 60 year old African American female  with past medical history of asthma, hypertension and hyperlipidemia, who  presents with increasing shortness of breath.  The patient states that over  the last few days, she has been complaining of increased sinus drainage and  sore throat and then yesterday, while driving home, she suddenly started  having just acute shortness of breath.  She normally takes an asthma  inhaler, which did not work for her.  The patient was brought into the ED  and she was found to be severely short of breath.  She was put on some  oxygen and she was given some IV steroid and breathing treatments, however,  this did not relieve her chest tightness; patient had multiple doses.  Exertional activity caused her to further become short of breath.  A chest x-  ray was done which showed no evidence of any acute disease.  The patient,  herself, continues to complain of shortness of breath, which is no better  than when she first came in.  She denies any fevers or chills.  She  complains of chest tightness and soreness.  She complains of fatigue.  She  complains of wheezing as well.  She denies any headache.  She denies any  belly pain or extremity pain or weakness.  She states that she is tired all  over.   PAST MEDICAL HISTORY:  1. Asthma with frequent exacerbations.  2. GERD, which she states is not well-controlled.  3. Tobacco abuse.  4. Hyperlipidemia.  5. Hypertension.  6. Borderline diabetes.   MEDICATIONS:  1. The patient is on an asthma inhaler.  2. Lipitor.  3. Nexium.  4. Diovan and hydrochlorothiazide 160/25 mg.  5. Zocor 40 mg  6. Advair 250/50 mcg.   ALLERGIES:  She is allergic to CODEINE.   SOCIAL HISTORY:  She denies any alcohol or drug use.  She denies any  multivitamin or herbal use.  She does smoke a half a pack a day for the last  20 years.   FAMILY HISTORY:  Family history noncontributory.   PHYSICAL EXAMINATION:  VITALS ON ADMISSION:  Her heart rate was 95,  temperature 98.3, respirations 24.  She was saturating actually 100% on room  air.  Blood pressure was 172/111 when she first came in; now  it is 137/66.  GENERAL:  In general, she is alert and oriented x3.  She is in moderate  respiratory distress.  HEENT:  Normocephalic, atraumatic.  Patient's mucous membranes are dry.  CARDIOVASCULAR:  Regular rhythm, mild tachycardia.  LUNGS:  She has expiratory wheezing throughout with poor airway exchange.  ABDOMEN:  Abdomen is soft, nontender and nondistended.  Positive bowel  sounds.  EXTREMITIES:  No clubbing, cyanosis or edema.   LABORATORY WORK:  Her chest x-ray shows no acute disease, decreased volumes.   ASSESSMENT AND PLAN:  1. This is a 60 year old African American female with past medical history     of asthma, exacerbation likely caused by likely either a new-onset cold,     plus the weather change, plus her tobacco abuse, plus the     gastroesophageal reflux disease.  We will put the patient on intravenous     Solu-Medrol, intravenous steroids, oxygen, albuterol nebulizers.  We will     cover her with a sliding scale while she is on the steroids.  We will go     ahead and give her a little bit of morphine to help ease the pain relief.  2. Tobacco abuse:  We will put her on a Nicoderm patch.  3. Gastroesophageal reflux disease:  We will continue her Nexium.  4. Hypertension:  We will continue her on her  Diovan.  5. Increased lipids:  We will continue her on her Zocor as well.  6. In addition, we will also cover her with an antibiotic as well.      Hollice Espy, M.D.                  Sherin Quarry, MD    SKK/MEDQ  D:  02/14/2003  T:  02/14/2003  Job:  [id number]   cc:   Holley Bouche, M.D.  [address]  [place], [place] [address]  Fax: [phone]

## 2010-09-16 NOTE — Discharge Summary (Signed)
[place]  Patient:    KIMIA, FINAN                       MRN: [id number] Proc. Date: 07/21/00 Adm. Date:  [id number] Disc. Date: [id number] Attending:  Selina Cooley CC:         Arvella Merles, M.D.  Darden Palmer., M.D.  Florencia Reasons, M.D.   Discharge Summary  DATE OF BIRTH:  1950-06-02.  DISCHARGE DIAGNOSES:  1. Asthma exacerbation.     a. Bronchitis.     b. Noncompliance.     c. Similar hospitalization March 2001.  2. Gastroesophageal reflux disease/hiatal hernia.     a. Esophagogastroduodenoscopy (August of 2001):  Mild reflux.  3. Atypical chest pain.     a. Normal EKG.     b. Suspect secondary to cough and asthma.     c. Cardiac catheterization (1994):  Mild disease.     d. Cardiolite 1994 negative, ejection fraction 40%, no ischemia or scar.  4. Steroid induced hyperglycemia.  5. Uncontrolled hypertension.  6. Dyslipidemia.  7. Chronic left lower extremity pain.  8. History of deep venous thrombosis after tubal ligation years ago.  9. Status post hysterectomy. 10. Status post hemorrhoidectomy. 11. Status post hernia repair. 12. Ongoing tobacco use. 13. ALLERGIES: CODEINE. 14. Hypokalemia secondary to dehydration, resolved. 15. Dehydration, resolved.  DISCHARGE MEDICATIONS:  1. Albuterol nebulizer q.6h. x 10 days.  2. Once above completed, begin Serevent MDI with spacer two puffs b.i.d.  3. (New) Septra DS one p.o. q.12h. x 10 days.  4. (New) Advair Diskus 250/50 one puff twice a day.  5. (New) Humibid LA 600 mg two p.o. q.12h. x 10 days.  6. (Increased dose) Diovan 160 mg p.o. q.d. (previously on 80 q.d.).  7. (New) Aspirin 325 mg q.d. - protect heart.  8. (New) Prednisone 20 mg two pills x 3 days, one pill x 3 days, then stop.  9. (New) Singulair 10 mg p.o. q.h.s. 10. (New) Clonidine 0.1 mg p.o. q.12h. 11. Premarin 0.625 mg q.d. 12. Nexium 40 mg q.d. 13. Zocor 40 mg q.d.  CONDITION ON  DISCHARGE:  Stable.  PHYSICAL EXAMINATION:  There was occasional rhonchi with end expiratory wheezing.  Peak flows 200.  Oxygen saturation 100% on room air.  Morning CBG 89.  Blood pressure 189/95, heart rate 84.  DISPOSITION:  Home.  RECOMMENDED ACTIVITY:  No strenuous.  RECOMMENDED DIET:  No concentrated sweets, low salt, low fat.  SPECIAL INSTRUCTIONS:  STOP SMOKING.  FOLLOW-UP:  The patient has an appointment to see Arvella Merles, M.D., on Thursday, July 26, 2000, at 9:20 a.m.  At that visit he should follow up her peak flows as well as her blood pressures and lung examination and CBG.  He should also stress the importance of long-term compliance to prevent repeat hospitalizations.  Encourage smoking cessation.  CONSULTANTS:  None.  PROCEDURES: 1. EKG (July 20, 2000):  Normal sinus rhythm, no abnormalities. 2. Chest x-ray (July 17, 2000):  Lungs clear, heart borderline enlarged.    No active lung disease.  HOSPITAL COURSE:  #1 - REACTIVE AIRWAY DISEASE:  Mrs. Howse is a 60 year old African-American smoker with a history of asthma who stopped taking her chronic inhalers approximately five months ago.  She presents with a two day history of progressive cough and shortness of breath.  She had associated chills but no fever.  On admission, she had decreased  breath sounds bilaterally with diffuse wheezes.  Respiratory rate was 30.  Oxygen saturations were 94% on room air. She was treated in the office with nebulizers without improvement.  She was referred to the hospital for admission for IV steroids and close observation. During her stay here, she was slow to respond.  We did start her back on Advair. At the time of discharge I did opt to treat her for bronchitis with 10 days of Septra in addition to Humibid.  She will go home on Nebulizers for 10 days and then be switched to Serevent two puffs twice a day.  Advair Diskus was started.  She was instructed never to stop  these medications. We will use a rapid prednisone taper and add Singulair in an attempt to control her reactive airway disease.  Her acid reflux is under fair control and we did not increase her Nexium.  She demonstrated no significant allergy symptoms but if this were to occur, would consider antihistamine.  She will contact Dr. Tiburcio Pea if her symptoms worsen and otherwise will see him on July 26, 2000.  Again, I stressed the importance of smoking cessation. The patient declined patch at this time.  #2 - UNCONTROLLED HYPERTENSION:  This was of course aggravated by the steroids.  We did increase her angiotensin II inhibitor.  One could consider adding a diuretic to that as an outpatient.  We also added clonidine 0.1 mg p.o. q.12h.  Dr. Tiburcio Pea can repeat the blood pressure and titrate these medication as needed.  #3 -  ATYPICAL CHEST PAIN:  Mr. Valeri often has atypical chest pain with her asthma exacerbations.  She was catheterized in 1994 and found to have very mild LAD disease with an ejection fraction of 40%.  Her EKG this admission was completely normal during chest pain.  She is 60 years old and does continue to smoke and have uncontrolled hypertension.  Would consider referral to W. Ashley Royalty., M.D., for risk stratification as an outpatient.  This can be discussed.  DISCHARGE LABS:  Hemoglobin 12.1, MCV 92, wbc 9400, platelet count 321. Sodium 134, potassium 4.4, chloride 103, bicarb 24, glucose 140, BUN 19, creatinine 0.7, calcium 9.2 (fasting sugar on the day of discharge was 89). DD: 07/21/00 TD:  07/23/00 Job: [id number]

## 2010-09-16 NOTE — Consult Note (Signed)
NAMEMarland Kitchen  [name], [name]                          ACCOUNT NO.:  [id number]   MEDICAL RECORD NO.:  [id number]                   PATIENT TYPE:  INP   LOCATION:  3022                                 FACILITY:  [facility]   PHYSICIAN:  Shan Levans, M.D. [facility]            DATE OF BIRTH:  11/25/1950   DATE OF CONSULTATION:  06/05/2003  DATE OF DISCHARGE:                                   CONSULTATION   CHIEF COMPLAINT:  Asthmatic bronchitis with failure to respond to treatment.   HISTORY OF PRESENT ILLNESS:  This is a 60 year old African-American female  with history of asthmatic bronchitis, confirmed tobacco [place], seen in the  emergency room on May 31, 2003, with status asthmaticus after having  cough and wheeze for two to three days with myalgias, sinus congestion,  fever, chills, sore throat. She works at [employer] and has many sick contacts  there, does actively smoke.   PAST MEDICAL HISTORY:  1. Asthma.  2. Tobacco use.  3. Hypertension.  4. Reflux.   MEDICATIONS PRIOR TO ADMISSION:  1. Albuterol nebulizer.  2. Advair 250/50 b.i.d.  3. Diovan 160 mg daily.  4. HCTZ 25 mg daily.  5. Nexium daily.  6. Zocor daily.   SOCIAL HISTORY:  Smokes one-half pack a day, works as a Production designer, theatre/television/film at [employer].  Denies ethanol use.   FAMILY HISTORY:  Noncontributory.   CURRENT MEDICATIONS:  1. Avelox 400 mg daily.  2. Clarinex 10 mg daily.  3. Zocor 40 mg daily.  4. Avapro 300 mg daily.  5. Hydrochlorothiazide 25 mg daily.  6. Nicotine daily.  7. Insulin sliding scale.  8. Atrovent every 4 hours.  9. Advair b.i.d.  10.      Potassium.  11.      Nexium 20 mg b.i.d.  12.      Guaifenesin b.i.d.  13.      Prednisone 40 mg daily.  14.      Nebulized albuterol q.4h.  15.      Lantus daily.   PHYSICAL EXAMINATION:  VITAL SIGNS:  Temperature 98, blood pressure 158/98,  pulse 73, respirations 18, O2 saturation 100%.  Blood sugar 234.  GENERAL:  Ill-appearing African-American female in no acute  distress.  CHEST: Showed inspiratory and expiratory wheeze with poor air movement.  CARDIAC: Regular rate and rhythm with S3, normal S2.  ABDOMEN:  Protuberant.  Bowel sounds active.  EXTREMITIES:  No edema or clubbing.  NEUROLOGIC:  Exam was intact.  HEENT:  There was purulent mucus seen in the posterior oropharynx.  Nares  were occluded with inflammation.  Sinuses tender in the maxillary regions.   LABORATORY DATA:  Sodium 134, potassium 4.2, chloride 97, CO2 27, BUN 22,  creatinine 1.1.  The pH was 7.39, PCO2 43, PO2 pending.   Chest x-ray showed atelectatic changes, otherwise clear.   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   RECOMMENDATIONS:  1. Check CT scan of sinuses.  2. Steroids IV.  3. Change to Augmentin for better sinus coverage.  4. Discontinue Claritin.  5. Give nasal hygiene.  6. Intensify nebulizer treatments, but discontinue Atrovent and Advair.  7. See orders.                                               Shan Levans, M.D. [facility]    PW/MEDQ  D:  06/05/2003  T:  06/05/2003  Job:  [id number]   cc:   Melissa L. Ladona Ridgel, MD  [address] [place], [place] [address]  Fax: [phone]   Holley Bouche, M.D.  [address]  [place], [place] [address]  Fax: [phone]

## 2010-09-16 NOTE — Procedures (Signed)
[facility]  Patient:    [name], [name]                       MRN: [id number] Adm. Date:  [id number] Disc. Date: [id number] Attending:  Rich Brave CC:         Arvella Merles, M.D.   Procedure Report  PROCEDURE:  Upper endoscopy.  ENDOSCOPIST:  Florencia Reasons, M.D.  ANESTHESIA:  INDICATIONS:  Note, this is a repeat dictation.  The original dictation, done on a day when the computers were down and the medical record number was not available.  Apparently got loss.  DESCRIPTION OF PROCEDURE:  The nature, purpose, and risks of the procedure were familiar to the patient who provided written consent.  Sedation was fentanyl 50 mcg and Versed 5 mg IV.  The Olympus video endoscope was passed under direct vision.  The esophagus had a normal appearance without evidence of overt esophagitis, although there was a question of some perhaps subtle reflux changes in the mid esophageal region.  There was a minimal hiatal hernia present.  There was no evidence of frank reflux esophagitis or Barretts esophagus.  No ring or stricture.  There was a question of some subtle reflux related changes in the laryngeal region.  The stomach was normal, as was the pylorus and duodenum.  No biopsies were obtained.  The patient tolerated the procedure well and there were no apparent complications.  IMPRESSION:  Essentially normal examination.  Questionable subtle reflux related changes in the mid esophageal region and the larynx.  PLAN:  Continue PPI therapy and consider 24-hour pH monitoring. DD:  12/12/99 TD:  12/13/99 Job: [id number]

## 2010-09-16 NOTE — Discharge Summary (Signed)
NAME:  CAMIA, DIPINTO                          ACCOUNT NO.:  [id number]   MEDICAL RECORD NO.:  [id number]                   PATIENT TYPE:  INP   LOCATION:  5024                                 FACILITY:  [facility]   PHYSICIAN:  Deirdre Peer. Polite, M.D.              DATE OF BIRTH:  12-Aug-1950   DATE OF ADMISSION:  01/19/2002  DATE OF DISCHARGE:  01/21/2002                                 DISCHARGE SUMMARY   DISCHARGE DIAGNOSES:  1. Acute exacerbation of asthma, unresponsive to outpatient, resolved.  2. Hypokalemia, resolved.  3. Hyperglycemia, episodic, resolved.  4. Tobacco abuse.  5. Gastroesophageal reflux disease, stable.  6. Hypertension, stable.  7. Hyperlipidemia, stable.  8. History of deep venous thrombosis.   DISCHARGE MEDICATIONS:  1. Nexium 40 mg p.o. q.d.  2. Diovan Hydrochlorothiazide 160/25 p.o. q.d.  3. Singulair 10 mg p.o. q.d.  4. Zocor 40 mg p.o. q.d.  5. Advair 250/50 q.d.  6. Albuterol nebs p.r.n.  7. Tequin 400 mg p.o. q.d.  8. Oral Prednisone taper.   ALLERGIES:  NO KNOWN DRUG ALLERGIES.   PROCEDURE:  None.   HISTORY OF PRESENT ILLNESS:  The patient presented to the emergency room  with complaint of wheezing and shortness of breath on 01/19/2002.  She is  known to have asthma with multiple prior admissions for exacerbations.  The  patient was in her usual state of health until 01/13/2002 when she developed  sore throat and nasal congestion.  On 01/15/2002 the patient began to  experience wheezing and shortness of breath.  She was seen by Dr. Tiburcio Pea on  01/16/2002 and placed on Levaquin and Prednisone taper.  She reports good  compliance with this.  She denies fever or chills.  Admits to difficulty  with sleeping due to her respiratory symptoms, complaints of fatigue.  She  feels her respiratory symptoms have not substantially improved over the last  3 days.   HOSPITAL COURSE:  Please see history of present illness.  The patient was  admitted to the  emergency room with vital signs of 98.0 degrees temperature,  blood pressure 155/84, heart rate 98, respirations 22, 02 sats 100%.   LABORATORY VALUES ON ADMISSION:  White count 12.0, hemoglobin 12.4,  hematocrit 36.0, platelet count 326,000.  Sodium 138, potassium 2.8,  chloride 99.  CO2 26, BUN 15, creatinine 1.1, glucose 173.  The patient's  liver function tests were normal.  In the emergency room she received  supplemental oxygen, Albuterol and Atropine nebulizers as well as 60 mg of  Prednisone p.o.  Chest x-ray was within normal limits without acute process.  She was admitted to a regular bed for observation.  The patient was  maintained on her Singulair from home for her asthma.  She was given Tequin  presumptively for a upper respiratory infection unresponsive to outpatient  treatment as well as IV steroid therapy.  She was placed on  Xopenex and  Atrovent nebulizers.  She was given Humibid for congestion.  Peak flow  monitoring was done pre and post each treatment.  Peak flows on 01/20/2002,  pretreatment average peak flow was 123, post treatment average peak flow was  200.   DIAGNOSES:  1. Hypokalemia.  The patient was given p.o. potassium replacement.  Please     see addendum for discharge potassium level.  2. Hyperglycemia, episodic.  The patient's glucose on admission was noted to     be 173.  The patient has been on steroid taper for several days prior to     admission and elevation in glucose thought to be secondary to steroid     use.  Please see addendum for glucose level on day of discharge.  3. Tobacco abuse.  The patient was placed on Nicotine patch for the duration     of admission and counseled regarding the importance of smoking cessation     and given information on community resources to help her quit smoking.  4. Gastroesophageal reflux disease.  The patient was kept on Nexium     throughout her admission and did not suffer any signs or symptoms of      gastroesophageal reflux disease exacerbation.  5. Hypertension.  The patient was noted to be hypertensive on admission,     thought to be secondary to the stress of her respiratory condition.     Blood pressure on the morning of 01/20/2002 130/70.  The patient was kept     on Diovan/Hydrochlorothiazide during her admission.  6. Hyperlipidemia.  The patient was maintained on Zocor during her     admission.  7. History of deep venous thrombosis.  The patient did not demonstrate any     signs or symptoms of deep venous thrombosis during her stay in the     hospital.   CONSULTATIONS:  None.   DISCHARGE LABORATORY:  Please see addendum.   CONDITION ON DISCHARGE:  Improved, stable.   DISPOSITION:  Discharged to home.   FOLLOW UP:  The patient is to follow-up with Dr. Tiburcio Pea her primary care  physician within one week to 10 days after discharge.  The patient was  strongly encouraged to quit smoking.  The patient was instructed to notify  Dr. Tiburcio Pea and or return to the emergency room should she experience fever,  chills, throbbing, chest pain, shortness of breath, pain in her legs,  swelling of her legs or increase in respiratory difficulty.      Ellender Hose. Virl Son. Polite, M.D.    SMD/MEDQ  D:  01/20/2002  T:  01/23/2002  Job:  [id number]   cc:   Tiburcio Pea, M.D.

## 2010-09-16 NOTE — H&P (Signed)
[facility]  Patient:    [name], [name]                       MRN: [id number] Adm. Date:  [id number] Disc. Date: [id number] Attending:  Armanda Heritage                   Psychiatric Admission Assessment  IDENTIFYING INFORMATION:  This is a 60 year old African-American female referred secondary to an overdose.  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  She has become depressed with multiple neurovegetative symptoms including anhedonia, anergia, insomnia, difficulty concentrating, hopelessness, and worthlessness. She has begun to have suicidal ideation.  She states she began to hallucinate, hearing a voice telling her to overdose.  PAST PSYCHIATRIC HISTORY:  None.  PAST MEDICAL HISTORY:  The patient has asthma, hypertension, hypercholesterolemia, GERD.  She was recently hospitalized for treatment of her severe asthma episode.  MEDICATIONS: 1. Prednisone 10 mg q.d. 2. Clonidine 0.1 mg q.12h. 3. Lorazepam 0.5 mg t.i.d. 4. Singulair 10 mg q.h.s. 5. Zocor 40 mg q.d. 6. Albuterol two puffs q.d. 7. Premarin 1.25 mg q.d.  DRUG ALLERGIES:  No known drug allergies.  FAMILY HISTORY:  Patient denies.  SUBSTANCE ABUSE HISTORY:  Patient denies.  SOCIAL HISTORY:  The patient lives with her second husband.  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  The patient has three children who do not live in the home. She denies any history of abuse.  She has no legal problems.  ADMISSION MENTAL STATUS EXAMINATION:  A reserved, but cooperative African-American female.  She was dressed casually with intermittent eye contact.  There was psychomotor  retardation.  Speech soft and slow.  Mood depressed, anxious.  Affect constricted with positive suicidal ideation as per history of present illness.  No homicidal ideation.  There were positive hallucinations as per history of present illness.  No evidence of delusions. Thought processes logical and goal directed.  Thought content:  No predominant theme.  On cognitive exam she was alert and oriented x 4.  Short term and long term memory adequate.  General fund of knowledge age and education level appropriate.  Attention and concentration diminished.  Judgment fair, insight good.  ADMISSION DIAGNOSES: Axis I:    Major depressive disorder, severe, with psychosis. Axis II:   Deferred. Axis III:  1. Asthma.            2. Hypertension.            3. Hypercholesterolemia.            4. Gastroesophageal reflux disease.            5. Hormone replacement therapy. Axis IV:   Severe. Axis V:    Global assessment of functioning 20, past year 75.  STRENGTHS AND ASSETS:  Roque Lias, has had a job for many years, supportive husband.  PROBLEMS:  Mood instability with suicidal ideation.  SHORT TERM TREATMENT GOAL:  Resolution of suicidal ideation.  LONG TERM TREATMENT GOAL:  Resolution of mood instability.  PLAN:  Begin Celexa 20 mg q.a.m.  Will evaluate whether there is a need for an antipsychotic if hallucinations persist.  The patient will also be involved in unit therapeutic groups and activities to improve coping strategies and improve reality testing and decrease suicidal ideation.  In addition she will have a family session with her husband to assess the level of support in the home.  Anticipated length of stay 2-3 days.  CONDITION NECESSARY FOR DISCHARGE:  Not suicidal.  POST HOSPITAL CARE PLANS:  Return home to live with her husband.  Follow-up therapy and medication management will be arranged with a therapist and psychiatrist on her insurance panel prior to discharge. DD:   08/28/00 TD:  08/29/00 Job: [id number]

## 2010-09-16 NOTE — Discharge Summary (Signed)
. [facility]  Patient:    [name], [name]                       MRN: [id number] Adm. Date:  [id number] Disc. Date: [id number] Attending:  Selina Cooley Dictator:   Jetty Duhamel, M.D.                           Discharge Summary  DATE OF BIRTH:  October 07, 1950   DISCHARGE DIAGNOSES:  1. Asthma exacerbation.     a. Viral upper respiratory illness.     b. Medical noncompliance.     c. Recent similar hospitalization in March 2002.  2. Gastroesophageal reflux disease/hiatal hernia.  3. Atypical chest pain on admission of March 2002, please see previous     discharge summary.  4. Steroid induced hyperglycemia.  5. Uncontrolled hypertension.  6. Dyslipidemia.  7. Chronic left lower extremity pain.  8. History of deep venous thrombosis after tubal ligation years ago.  9. Status post hysterectomy. 10. Status post hemorrhoidectomy. 11. Status post hernia repair. 12. Ongoing tobacco abuse. 13. Allergy to CODEINE.  DISCHARGE MEDICATIONS:  1. Prednisone 10 mg three pills q.d. x 2 days, then two pills q.d. x 2 days,     then one pill q.d. x 2 days, then stop.  2. Claritin 10 mg q.d.  3. Maxzide 37.5/25 mg q.d.  4. Clonidine 0.2 mg b.i.d.  5. Valsartan 160 mg q.d.  6. Premarin 0.625 mg q.d.  7. Singular 10 mg q.d.  8. Nexium 40 mg q.d.  9. Zocor 40 mg q.d. 10. Aspirin 325 mg q.d. 11. Albuterol and Atrovent nebulizers continue four times a day x 10 days,     then stop. 12. Advair diskus 250/50 one puff twice a day after you complete 10-day course     of nebulizer treatments.  CONSULTATIONS:  None.  PROCEDURES:  None.  FOLLOWUP:  The patient will follow up with her primary care physician, Dr. Holley Bouche, on Tuesday, April 16, at 8:40 a.m.  At that time, it is recommended that a basic metabolic panel be obtained to assess the patients electrolytes given the onset of Maxzide therapy.  Additionally, peak flow should be obtained and based  upon physical exam and control of asthmatic symptoms, consideration should be given to referral to Dr. Billy Fischer, of [facility], for ongoing consultation.  HISTORY OF PRESENT ILLNESS:  [name] is a 60 year old female who continues to smoke and was admitted to the hospital on July 30, 2000, with a flare of her asthma characterized by worsening shortness of breath with dyspnea on exertion and peak flows below 150 in the days prior to her admission.  She had reported that nebulizer treatment at home was no longer helping and felt that her shortness of breath was progressing.  HOSPITAL COURSE:  #1 - REACTIVE AIRWAY DISEASE:  Due to significant exacerbation and the patients reactive airway disease, [name] was admitted to the acute unit for ongoing care.  She received IV Solu-Medrol initially which was then tapered to p.o. prednisone.  Additionally, she received albuterol and Atrovent nebulizers on a q.4h. basis initially with q.2h. dosing p.r.n. and this was eventually tapered to a q.i.d. standing dose. Additionally, full symptomatic treatment was added in the form of Claritin as well as Singular to ensure the control of seasonal allergies was added to attempt to diminish her recurrence of reactive airway  disease exacerbations. Additionally, she was continued on her Nexium for her known gastroesophageal reflux disease which is a likely exacerbating agent.  The patient improved significantly during hospitalization and at the time of discharge, was ambulating in her room and hallway without significant difficulty.  Minimal wheezing continued and therefore, the patient was sent home on a continued steroid taper with instructions to continue nebulizer treatments for 10 days. At such time, she will convert to a regimen to include Advair as opposed to nebulizer treatments and the remainder of medications as detailed in discharge medications.  At the time of discharge, the patient  was comfortable with her breathing and stated that she did feel that she was ready for discharge.  Peak flow at the time of discharge was approximately 240.  This indicated a significant degree of improvement since admission where peak flows were less than 150.  #2 - ONGOING TOBACCO ABUSE:  The patient admitted to ongoing tobacco abuse and was counseled extensively during her hospitalization as to the absolute need to discontinue this habit.  #3 - UNCONTROLLED HYPERTENSION:  During hospitalization, the patients blood pressure was slightly exacerbated and it was felt that this was likely due to elevation of steroid dosing.  As this trend continued, clonidine was tapered upward and Maxzide was added as well.  Blood pressure was falling during hospitalization and was adequately controlled on this regimen.  The patient is being discharged on this regimen, as detailed above, and it is recommended that followup as an outpatient be continued for possible further titration.  DISCHARGE LABORATORY DATA AND X-RAY FINDINGS:  On date of discharge, August 06, 2000, sodium was 131, potassium 3.7, chloride 95, CO2 27, BUN 22, creatinine 0.9, glucose 116.  CONDITION ON DISCHARGE:  Much improved.                            DD: 08/06/00 TD:  08/06/00 Job: [id number]

## 2010-09-16 NOTE — H&P (Signed)
[name]  Patient:    [name]                        MRN: [id number] Adm. Date:  [id number] Attending:  Nolene Ebbs Iv                         History and Physical  CHIEF COMPLAINT:  "Cant breathe."  HISTORY OF PRESENT ILLNESS:  This 60 year old black female with a history of asthma, complains of the onset five days prior to admission of a cough with wheezing and tightness.  It worsened this evening, and began to vomit with the coughing.  Positive pleuritic chest pain, secondary to the cough as well.  The cough is nonproductive, and she has had no fever.  She was hospitalized in August 2000, for asthma, but after about one month stopped both her albuterol and AeroBid inhalers.  She tried to restart these yesterday,  without relief.  After an evaluation by the emergency room physician and continuous albuterol nebulizer x 1 hour, the patient was still significantly symptomatic, ith continued wheezing.  PAST MEDICAL HISTORY: 1. Asthma, onset in 1999, when she developed a pneumonia following foot surgery.    (Had an excision of  Mortons neuroma.)  Shortly after developed chest pain,    prompting a hospitalization for negative rule out myocardial infarction    protocol.  She subsequently developed pneumonia. 2. Hypertension. 3. Hyperlipidemia. 4. History of atypical chest pain.  Had a catheterization in August 1994,    which showed very minimal coronary artery disease with mild irregularity    in the LAD.  During the admission in January 1999, she had a stress Cardiolite    which was normal with an ejection fraction of 54%.  Most of her chest pain    has been felt secondary to GE reflux previously. 5. GE reflux with a hiatal hernia, on Propulsid in the past.  PAST SURGICAL HISTORY: 1. Hysterectomy. 2. Tubal ligation. 3. Hemorrhoidectomy. 4. Status post hernia repair.  CURRENT MEDICATIONS: 1. Zocor 40 mg q.d. 2. Lasix  40 mg q.d. 3. Prilosec 20 mg q.d. 4. Premarin 0.625 mg q.d.  She had stopped Ziac, AeroBid, and albuterol.  ALLERGIES:  CODEINE.  FAMILY HISTORY:  Father died of CHF, and also had diabetes mellitus.  Mother is  hypertensive, but otherwise is alive and well.  Siblings have had a CVA, coronary artery disease, hypertension, and diabetes mellitus.  SOCIAL HISTORY:  Still smokes 1/2 pack of cigarettes per day.  No alcohol.  She has been under recent stress with her husband being diagnosed with colon cancer, and getting ready to start chemotherapy.  REVIEW OF SYSTEMS:  Has left wrist carpal tunnel syndrome, recent sinus drainage. No sore throat, no GI symptoms.  PHYSICAL EXAMINATION:  VITAL SIGNS:  Blood pressure 140/96, temperature 97.7 degrees, pulse 110, respirations 20-25.  No acute respiratory distress.  GENERAL:  Well-developed, well-nourished.  O2 saturation 98% on room air.  HEENT:  Normocephalic, atraumatic.  Pupils equal, round, reactive to light. Fundi normal.  Oropharynx normal.  Tympanic membranes normal.  NECK:  Supple, nontender.  No adenopathy.  LUNGS:  Diffuse expiratory and inspiratory wheezes and rhonchi but no rales. Overall symmetrical air movement.  HEART:  A regular rate and rhythm without murmur, gallop, or rub.  ABDOMEN:  Positive bowel sounds, soft, nontender.  No mass or guarding.  EXTREMITIES:  Without  edema.  She has normal peripheral pulses.  Deep tendon reflexes 2+ and symmetrical.  GENITOURINARY:  Deferred.  A chest x-ray shows no infiltrates.  I-STAT shows sodium of sodium 140, potassium 3.4, chloride 104, BUN 16, glucose  139, hemoglobin 13.  CMET and CBC pending.  IMPRESSION: 1. Asthma exacerbation, with probable underlying bronchitis, bilateral    versus bacterial. 2. Hypertension. 3. Hyperlipidemia. 4. Gastroesophageal reflux.  PLAN: 1. Will admit for albuterol nebulizers, Solu-Medrol IV.  Begin p.o. Biaxin and    add  guaifenesin. 2. Will follow the blood pressure off the Ziac.  Continue her usual medications  as at home. 3. Encourage the patient to discontinue tobacco. 4. Education concerning asthma, especially the need to call early in the course    of an exacerbation, to hopefully prevent future hospitalizations. DD:  03/23/99 TD:  03/23/99 Job: [id number]

## 2010-09-16 NOTE — Discharge Summary (Signed)
[facility]  Patient:    [name], [name]                       MRN: [id number] Adm. Date:  [id number] Disc. Date: [id number] Attending:  Jasmine Pang CC:         Dr. Lorina Rabon.   Discharge Summary  REASON FOR ADMISSION:  Patient was a 60 year old African-American female referred secondary to an overdose.  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  She has become extremely depressed.  She states she heard a voice telling her to overdose.  PHYSICAL EXAMINATION:  Patient has GERD, asthma, hypercholesterolemia, history for DVT, recent dental work on an upper partial, hypertension.  ADMISSION LABORATORIES:  Hypothyroid panel was within normal limits.  A comprehensive metabolic panel was grossly within normal limits except for a slightly elevated BUN at 26 (6 - 23).  This did not appear to be clinically significant.  Other labs were done in the emergency room prior to her transfer to our unit.  An ECG was done after starting Geodon on August 10, 2000, which revealed normal sinus rhythm.  There was minimal voltage criteria for LDH. This may be a normal variant.  This ECG had not been read yet by the cardiologist but did not appear to have significant abnormalities.  HOSPITAL COURSE:  Upon admission, patient was continued on her multiple medications.  These included albuterol inhaler, Clonidine 0.1 mg q.12h., Premarin 1.25 mg q.d., Singulair 10 mg q.h.s., Protonix 40 mg q.d., Prednisone 10 mg q.d. x 2 days then d/c, Zocor 40 mg q.d. and lorazepam 1 mg p.o. t.i.d. She was also begun on Celexa 20 mg q.d. to address her severe depression.  On August 09, 2000, she was started on Geodon 20 mg at h.s., then increased the following day to Geodon 40 mg p.o. b.i.d.  This was to address her auditory hallucinations, which had been  occurring prior to admission.  She states they had told her to kill herself.  She denied having these while on the unit.  She responded well to these medications. She was somewhat sedate initially on the Geodon.  This improved as her hospitalization progressed.  She was able to participate appropriately in unit therapeutic groups and activities to decrease her suicidal ideation.  Her husband was supportive and visited her during visitation hours.  DISCHARGE MENTAL STATUS EXAMINATION:  Improved.  Patient had less psychomotor retardation.  She was less reserved and more spontaneous.  Speech was normal rate and slow.  Eye contact was better.  Mood was less depressed and anxious. Affect, able to reveal wide range.  There was no suicidal or homicidal ideations.  There was no psychosis or perceptual disturbance.  Thought processes were logical and goal directed.  Cognitive exam was back to baseline.  Judgment good, insight good.  DISCHARGE DIAGNOSES: Axis I:     Major depressive disorder, severe, with psychosis. Axis II:    None. Axis II:    Gastroesophageal reflux disease, hypercholesterolemia, asthma,             history of deep vein thrombosis, hypertension, recent dental work             on her upper partial. Axis IV:    Severe. Axis V:     Current global assessment of function 55.  Highest past year  is 75.  Global assessment of function on admission was 20.  DISCHARGE MEDICATIONS: 1.  Celexa 20 mg q.d. 2.  Lorazepam 1 mg p.o. t.i.d. 3.  Geodon 40 mg p.o. b.i.d. 4.  Albuterol/Ipratropion nebulizer as per her primary care physicians     directions. 5.  Clonidine 0.1 mg p.o. 12 hours. 6.  Premarin 1.25 mg q.d. 7.  Singulair 10 mg q.h.s. 8.  Protonix 40 mg q.d. 9.  Prednisone 10 mg q.d. then d/c. 10. Zocor 40 mg q.d.  ACTIVITY LEVEL:  As tolerated for patients ability.  DIET:  No restrictions.  POST HOSPITAL CARE PLAN:  Patient will follow up with Dr. Lorina Rabon on August 23, 2000 at 3:30 p.m. DD:  08/12/00 TD:  08/12/00 Job: [id number]

## 2010-09-16 NOTE — Discharge Summary (Signed)
[place]  Patient:    Jacqueline, [name]                       MRN: [id number] Adm. Date:  [id number] Disc. Date: 07/19/99 Attending:  Anastasio Auerbach CC:         Arvella Merles, M.D.             Oley Balm Sung Amabile, M.D. [facility]             W. Ashley Royalty., M.D.                           Discharge Summary  DATE OF BIRTH:  April 19, 1951  DISCHARGE DIAGNOSES:  1. Asthma exacerbation secondary to bronchitis and NONCOMPLIANCE.  2. Hypoxia secondary to #1.  3. Gastroesophageal reflux disease/hiatal hernia.  4. Atypical chest pain secondary to above problems.  5. Steroid-induced hyperglycemia.  Glycohemoglobin 5.2% this admission.  6. Uncontrolled hypertension.  7. Dyslipidemia.  8. Abnormal electrocardiogram.  New inverted T wave from November, 2000.  9. Coronary artery disease (mild).     a. Catheterization (August, 1994):  Left anterior descending with mild        irregularities.     b. Stress Cardiolite (August, 1994):  No ischemia/scar:  Ejection fraction 4%. 10. Chronic left lower extremity pain.     a. Lower extremity Dopplers.  No DVT this admission. 11. History of deep venous thrombosis after tubal ligation years ago. 12. Status post hysterectomy. 13. Status post hemorrhoidectomy. 14. Status post hernia repair. 15. Ongoing tobacco use.  DISCHARGE MEDICATIONS:  1. Prednisone 10 mg, 60 mg to 10 mg taper through March 31, then stop (details     given to patient).  2. Prilosec 20 mg b.i.d. x 2 weeks, then q.d. as before.  3. Zocor 40 mg q.d.  4. Lasix 40 mg q.d.  5. Premarin 0.625 mg q.d.  6. (New) Reglan 10 mg q.a.c. and h.s.  7. (New) Altace 10 mg q.d.  8. (New) Humibid LA 600 mg two pills b.i.d. x 9 days.  9. (New) albuterol nebulizer 2.5 mg q.6h. x 9 days.  See next two medications.  10. (New) Combivent MDI with spacer two puffs q.6h. as needed after above     completed. 11. (New) Serevent MDI with spacer two puffs b.i.d. once  nebulizer treatments     completed. 12. (New) Septra DS one p.o. q.12h. x 9 days. 13. (New) Flovent 220 mcg MDI with spacer two puffs b.i.d. 14. (New) Wellbutrin SR 150 mg one q.a.m. x 7 days, then increase to b.i.d. (second     dose no later than 5 p.m. 15. (New) nicotine patch 21 mg x 2 weeks, then decrease to 14 mg x 2 weeks.  CONDITION ON DISCHARGE:  Stable, pain-free.  Oxygen saturations on room air 98%. An a.m. CBG 122.  RECOMMENDED ACTIVITY:  No strenuous activity for five days, then as tolerated.   RECOMMENDED DIET:  Low-fat, diabetic diet (no concentrated sweets).  SPECIAL INSTRUCTIONS:  1. DO NOT SMOKE.  2. TAKE YOUR MEDICATIONS.  FOLLOW-UP:  The patient has an appointment to see Holley Bouche on Monday, March 26, at 10 a.m.  She will need a basic metabolic panel checked at that time given the addition of Altace.  She will also need a referral to Dr. Donnie Aho for review of EKGs and possible outpatient testing.  CONSULTANTS:  None.  PROCEDURES:  1. Electrocardiogram:  Normal sinus rhythm, T wave abnormalities (inverted T waves     in leads III and aVF), new T wave inversions in V1 through V4 since previous     tracing on November 2000).  2. Chest x-ray (March 15):  Lungs are clear.  Heart size is upper limits of     normal.  Mildly tortuous thoracic aorta.  No active disease.  HOSPITAL COURSE: #1 - ASTHMA EXACERBATION:  Mrs. Kastelic is a 60 year old smoker with a history of asthma exacerbations requiring hospitalization (November, 2000).  She presents today with a one-week history of cough and progressive shortness of breath. She actually had seen Dr. Synthia Innocent, pulmonologist, approximately two months ago. At that time, she was placed on Serevent MDI, Flovent MDI, and Combivent MDI with spacer as needed.  Of course, this admission, she reported not taking any of those. She was also not taking the supplemental potassium, Altace, and Reglan  as directed from previous hospitalization.  Dr. Sung Amabile did tell her to stop taking the Singulair.  On presentation, she had diffuse bilateral wheezing and a cough of productive yellow sputum.  She had associated chest tightness which was relieved with nebulizers.  Her oxygen saturations were 92% on room air with a respiratory rate of 26.  She was admitted and received IV steroids as well as Septra for possible bronchitis.  She was started on Humibid.  Mrs. Nachtigal had diffuse wheezing and persistent shortness of breath throughout her stay.  Upon discharge, she still had expiratory wheezing and rhonchi but was stable.  She will go home on nebulizers as well as antibiotics for a total of 14 days.  She will follow up with Dr. Tiburcio Pea. Again, I stressed the importance of smoking cessation and medication compliance.  #2 - GASTROESOPHAGEAL REFLUX DISEASE:  Mrs. Cuoco has a known hiatal hernia with reflux symptoms.  She is on Prilosec daily at home.  Her last admission, she had quite a bit of heartburn and was started on Reglan.  We did that this admission, also.  She may not need to be on Reglan indefinitely as I think the steroids are probably aggravating her reflux.  She has a hemoglobin of 13.6 with an MCV of 90.  #3 - UNCONTROLLED HYPERTENSION:  Mrs. Sytsma was diagnosed with uncontrolled hypertension her last hospitalization and was placed on Altace in addition to her Lasix.  Of course, she was not taking this.  She does continue to take her Lasix because it helps with fluid buildup.  I discussed with her the importance of lowering her blood pressure given her known coronary artery disease as well as potential for diabetes (steroid-induced hyperglycemia).  I put her back on Altace and she will be discharged on this with follow-up BMP by Dr. Tiburcio Pea in one week. Of note, her blood pressure on admission was 180/106.  After addition of medications, it settled in and, upon  discharge, was 161/91.  We have not seen the full effects of the Altace and would recommend repeating as an outpatient.   #4 - ATYPICAL CHEST PAIN:  Mrs. Wisser had some burning in her chest associated  with increasing shortness of breath.  I suspect this was due to either her asthma or reflux.  We did do EKGs and enzymes.  Cardiac enzymes were negative despite really constant chest symptoms.  Her EKG did show some new inversion of T waves in the anterior leads.  For this reason, I recommend outpatient referral back  to Dr. Donnie Aho.  Dr. Donnie Aho catheterized her in August, 1994 and found that she had  mild irregularities in her LAD and an EF of 54% with no ischemia or scar by stress Cardiolite.  #5 - STEROID-INDUCED HYPERGLYCEMIA:  Mrs. Hendricks does get hyperglycemia on steroids.  Prior to discharge on 60 mg of steroids, her CBG was 122.  I recommended a no-concentrated-sweets diet.  Her glycohemoglobin was 5.2%.  She is overweight and does not exercise and certainly is predisposed to diabetes.  #6 - LEFT LOWER LEG PAIN:  [name] complained of some discomfort in her left leg.  Palpation demonstrated a questionable cord.  For this reason, lower extremity Dopplers were obtained.  They showed no evidence of DVT or other abnormalities. Of note, Mrs. Adolph does have a history of DVT after a tubal ligation years ago.   DISCHARGE LABORATORIES:  Sodium 132, potassium 4.2, chloride 96, bicarb 29, BUN 22, creatinine 0.9, glucose 133, calcium 8.8. DD:  07/19/99 TD:  07/19/99 Job: [id number]

## 2010-09-16 NOTE — H&P (Signed)
NAME:  [name], [name]                          ACCOUNT NO.:  [id number]   MEDICAL RECORD NO.:  [id number]                   PATIENT TYPE:  EMS   LOCATION:  [facility]                                 FACILITY:  [facility]   PHYSICIAN:  Corinna L. Lendell Caprice, MD             DATE OF BIRTH:  [birth year]   DATE OF ADMISSION:  05/31/2003  DATE OF DISCHARGE:                                HISTORY & PHYSICAL   CHIEF COMPLAINT:  Asthma.   HISTORY OF PRESENT ILLNESS:  [name] is a pleasant 60 year old black  female with a history of asthma and continued tobacco abuse who presents to  the emergency room with nonproductive cough, wheezing for two days, fevers,  and chills, myalgias, sinus congestion and pain, ear pain, and sore throat.  She also has many sick contacts at home with similar symptoms except for the  wheezing.   PAST MEDICAL HISTORY:  1. Asthma.  2. Tobacco abuse.  3. Hypertension.  4. Gastroesophageal reflux disease.   MEDICATIONS:  1. Albuterol MDI and nebulizer as needed.  2. Advair 250/50 b.i.d.  3. Diovan 160 mg daily.  4. Hydrochlorothiazide 25 mg daily.  5. Nexium.  6. Zocor 40 mg daily.   SOCIAL HISTORY:  The patient now smokes a half a pack a day, which is less  than previously. She is an International aid/development worker at [employer]. She denies alcohol  use.   FAMILY HISTORY:  Noncontributory.   REVIEW OF SYSTEMS:  As above, otherwise systems review is negative.   PHYSICAL EXAMINATION:  VITAL SIGNS: Blood pressure 137/79, pulse 106,  respiratory rate initially was 36, now is 16. Oxygen saturation 99% on room  air. Temperature 100.7 degrees.  GENERAL:  The patient is well-nourished, well-developed, in mild respiratory  distress.  HEENT:  Normocephalic, atraumatic. Pupils equal, round, and reactive to  light. Sclerae nonicteric. She has moist mucous membranes. No thrush.  Oropharynx is clear. Tympanic membranes show no erythema. She has tenderness  over the maxillary sinuses.  NECK:   Supple. No lymphadenopathy, no thyromegaly.  LUNGS:  She has diffuse expiratory wheezes.  CARDIOVASCULAR:  Fast, regular, no murmurs, gallops, rubs.  ABDOMEN:  Soft, nontender, nondistended.  GU/RECTAL:  Deferred.  EXTREMITIES:  No cyanosis, clubbing, or edema.  SKIN:  No rash.  PSYCHIATRIC:  Normal affect.  NEUROLOGIC:  Alert and oriented. Cranial nerves and sensory motor exam are  intact.   LABORATORY DATA:  PH is 7.399, pCO2 43. Hemoglobin 13, hematocrit 40. Basic  metabolic panel significant for a potassium of 3.2. Two sets of cardiac  enzymes negative. UA negative except for ketones. EKG shows sinus  tachycardia. Chest x-ray shows bronchitis, no infiltrates.   ASSESSMENT AND PLAN:  1. Status asthmaticus secondary to acute bronchitis versus viral illness. I     will check a flu swab. She will get IV steroids, hand held nebulizers,     oxygen. As she also has  a sinusitis will start Avelox.  2. Acute sinusitis. She will get aspirin and Allegra. Due to her     hypertension I will avoid Sudafed. She will also get Avelox as mentioned     above.  3. Hypertension.  Continue outpatient medications.  4. Continued tobacco abuse, counseled against.  5. Gastroesophageal reflux disease, continue Nexium.  6. History of steroid induced hyperglycemia. She will get Accu Checks and     sliding scale insulin per protocol while on steroids.                                                Corinna L. Lendell Caprice, MD    CLS/MEDQ  D:  05/31/2003  T:  05/31/2003  Job:  [id number]   cc:   Holley Bouche, M.D.  [address]  [place], [place] [address]  Fax: [phone]

## 2010-12-02 ENCOUNTER — Inpatient Hospital Stay (HOSPITAL_COMMUNITY)
Admission: EM | Admit: 2010-12-02 | Discharge: 2010-12-05 | DRG: 192 | Disposition: A | Discharge status: Home / Self Care | Payer: Managed Care, Other (non HMO) | Attending: Internal Medicine | Admitting: Internal Medicine

## 2010-12-02 ENCOUNTER — Emergency Department (HOSPITAL_COMMUNITY): Payer: Managed Care, Other (non HMO)

## 2010-12-02 DIAGNOSIS — D72829 Elevated white blood cell count, unspecified: Secondary | ICD-10-CM | POA: Diagnosis present

## 2010-12-02 DIAGNOSIS — Z87891 Personal history of nicotine dependence: Secondary | ICD-10-CM

## 2010-12-02 DIAGNOSIS — I10 Essential (primary) hypertension: Secondary | ICD-10-CM | POA: Diagnosis present

## 2010-12-02 DIAGNOSIS — M25519 Pain in unspecified shoulder: Secondary | ICD-10-CM | POA: Diagnosis present

## 2010-12-02 DIAGNOSIS — E876 Hypokalemia: Secondary | ICD-10-CM | POA: Diagnosis present

## 2010-12-02 DIAGNOSIS — Z7982 Long term (current) use of aspirin: Secondary | ICD-10-CM

## 2010-12-02 DIAGNOSIS — J441 Chronic obstructive pulmonary disease with (acute) exacerbation: Principal | ICD-10-CM | POA: Diagnosis present

## 2010-12-02 LAB — CARDIAC PANEL(CRET KIN+CKTOT+MB+TROPI)
Relative Index: 2.1 (ref 0.0–2.5)
Total CK: 107 U/L (ref 7–177)

## 2010-12-02 LAB — POCT I-STAT, CHEM 8
Calcium, Ion: 1.19 mmol/L (ref 1.12–1.32)
Chloride: 101 mEq/L (ref 96–112)
Glucose, Bld: 125 mg/dL — ABNORMAL HIGH (ref 70–99)
HCT: 39 % (ref 36.0–46.0)
Hemoglobin: 13.3 g/dL (ref 12.0–15.0)
TCO2: 28 mmol/L (ref 0–100)

## 2010-12-02 LAB — DIFFERENTIAL
Basophils Absolute: 0 10*3/uL (ref 0.0–0.1)
Basophils Relative: 0 % (ref 0–1)
Eosinophils Absolute: 0 10*3/uL (ref 0.0–0.7)
Eosinophils Relative: 0 % (ref 0–5)
Neutrophils Relative %: 82 % — ABNORMAL HIGH (ref 43–77)

## 2010-12-02 LAB — CK TOTAL AND CKMB (NOT AT ARMC)
CK, MB: 2.2 ng/mL (ref 0.3–4.0)
Relative Index: 1.9 (ref 0.0–2.5)
Total CK: 114 U/L (ref 7–177)

## 2010-12-02 LAB — CBC
MCV: 90.1 fL (ref 78.0–100.0)
Platelets: 322 10*3/uL (ref 150–400)
RBC: 4.06 MIL/uL (ref 3.87–5.11)
RDW: 12.9 % (ref 11.5–15.5)
WBC: 14.7 10*3/uL — ABNORMAL HIGH (ref 4.0–10.5)

## 2010-12-02 LAB — GLUCOSE, CAPILLARY: Glucose-Capillary: 112 mg/dL — ABNORMAL HIGH (ref 70–99)

## 2010-12-02 LAB — TROPONIN I: Troponin I: <0.3 ng/mL (ref <0.30)

## 2010-12-03 ENCOUNTER — Inpatient Hospital Stay (HOSPITAL_COMMUNITY): Payer: Managed Care, Other (non HMO)

## 2010-12-03 LAB — CARDIAC PANEL(CRET KIN+CKTOT+MB+TROPI)
CK, MB: 2.5 ng/mL (ref 0.3–4.0)
Relative Index: 2.3 (ref 0.0–2.5)
Total CK: 109 U/L (ref 7–177)

## 2010-12-03 LAB — CBC
HCT: 37.9 % (ref 36.0–46.0)
Hemoglobin: 12.9 g/dL (ref 12.0–15.0)
MCH: 30.8 pg (ref 26.0–34.0)
MCHC: 34 g/dL (ref 30.0–36.0)
RDW: 13.2 % (ref 11.5–15.5)

## 2010-12-03 LAB — TSH: TSH: 0.166 u[IU]/mL — ABNORMAL LOW (ref 0.350–4.500)

## 2010-12-03 LAB — COMPREHENSIVE METABOLIC PANEL
Albumin: 4.1 g/dL (ref 3.5–5.2)
BUN: 24 mg/dL — ABNORMAL HIGH (ref 6–23)
Calcium: 10 mg/dL (ref 8.4–10.5)
GFR calc Af Amer: >60 mL/min (ref >60)
Glucose, Bld: 179 mg/dL — ABNORMAL HIGH (ref 70–99)
Sodium: 138 mEq/L (ref 135–145)
Total Protein: 8.5 g/dL — ABNORMAL HIGH (ref 6.0–8.3)

## 2010-12-03 LAB — GLUCOSE, CAPILLARY: Glucose-Capillary: 391 mg/dL — ABNORMAL HIGH (ref 70–99)

## 2010-12-03 LAB — MAGNESIUM: Magnesium: 1.9 mg/dL (ref 1.5–2.5)

## 2010-12-03 LAB — PHOSPHORUS: Phosphorus: 3.5 mg/dL (ref 2.3–4.6)

## 2010-12-04 LAB — GLUCOSE, CAPILLARY

## 2011-01-11 NOTE — Discharge Summary (Signed)
[name], [name] NO.:  [id number]  MEDICAL RECORD NO.:  [id number]  LOCATION:  5509                         FACILITY:  [facility]  PHYSICIAN:  Talley Casco I Delle Andrzejewski, MD      DATE OF BIRTH:  [birth year]  DATE OF ADMISSION:  12/02/2010 DATE OF DISCHARGE:  12/05/2010                              DISCHARGE SUMMARY   PRIMARY CARE PHYSICIAN:  Holley Bouche, MD at [facility].  DISCHARGE DIAGNOSES: 1. Acute asthma exacerbation, history of chronic obstructive pulmonary     disease. 2. Hypertension. 3. Left shoulder pain  The patient has a past medical history that includes hysterectomy, hernia surgery, and hemorrhoidectomy.  CONDITION:  At the time of discharge, the patient is alert and oriented, and pleasant, happy that today is her birthday, she is ready for discharge home.  She is a little concerned that she still has some blood pressure elevations, this is atypical for her.  HISTORY OF BRIEF HOSPITAL COURSE:  This is a very pleasant 60 year old African American female with a history of asthma and COPD.  She tells me that she has not had any asthma exacerbations since 2 years.  She presented to the emergency department on December 02, 2010, with difficulty breathing, bringing up yellow sputum.  She also had associated chest tightness.  She reported that every one at home had an upper respiratory virus and she believes that caused her asthma become worse.  She was admitted to the [facility], started on IV Solu-Medrol. ACE inhibitors and ARBs were avoided to help alleviate the patient's cough.  She was placed on clonidine 0.1 mg p.o. b.i.d. and diltiazem was started.  She was also placed on nebulizer treatments and Avelox.  Over the course of 24 hours, the patient improved significantly.  On telemetry, she did have multiple episodes of PVCs, however, she remained in normal sinus rhythm.  Her breathing improved.  She was changed to p.o. prednisone.   She did have episodes of hypertension during her stay with blood pressures elevating to a high of 186/94.  Today, the morning of discharge December 05, 2010,  she is breathing comfortably, able to ambulate without distress.  She has had some dizziness, but is ready for discharge home.  PHYSICAL EXAMINATION AT THE TIME OF DISCHARGE:  GENERAL:  The patient is alert and oriented.  Her demeanor is pleasant, cooperative. VITAL SIGNS:  Temperature 98.0, pulse 74, respirations 18, blood pressure 134/74, O2 sats 97% on room air. NECK:  Head is atraumatic, normocephalic.  Eyes are anicteric with pupils are equal, round, and reactive to light.  Nose shows no nasal discharge or exterior lesions.  Mouth has moist mucous membranes with moderate dentition. NECK:  Supple with midline trachea.  No JVD.  No lymphadenopathy. CHEST:  Demonstrates no accessory muscle use.  She has minimal wheezes on expiration on the left side anteriorly. HEART:  Has a regular rate and rhythm without obvious murmurs, rubs, or gallops. ABDOMEN:  Soft, nontender, nondistended with good bowel sounds. EXTREMITIES:  Showed no clubbing, cyanosis, or edema. PSYCHIATRIC:  The patient is alert and oriented.  Demeanor is pleasant, cooperative.  Grooming is excellent.  PERTINENT LABS  THIS HOSPITALIZATION:  On December 03, 2010, the patient's white count had risen to 22, however, we believe this was because she was on steroids, hemoglobin 12.9, hematocrit 37.9, platelets 365.  BMET shows a sodium 138, potassium 3.4, chloride 99, bicarb 26, glucose 179, BUN 24, creatinine 0.79.  LFTs were within normal limits.  Total protein 8.5,  serum albumin 4.1.  Cardiac enzymes were cycled this admission and all found to be within normal limits.  TSH was taken and found to be low at 0.166.  PERTINENT RADIOLOGICAL EXAMS:  The patient had a 2-view chest x-ray on admission that showed no acute cardiopulmonary disease, no parenchymal opacities.  She  had an x-ray of her left humerus that showed no evidence of fracture or focal bone lesion.  She had an x-ray of her left forearm that showed no fracture or dislocation of normal study.  DISCHARGE MEDICATIONS: 1. Clonidine 0.1 mg by mouth daily. 2. Diltiazem 30 mg 1 tablet by mouth twice a day. 3. Hydrochlorothiazide 12.5-mg capsule by mouth twice daily. 4. Moxifloxacin 400 mg by mouth daily, take for 4 more days. 5. Prednisone 20 mg taper 3 tablets a day for 3 days and 2 tablets a     day for 3 days and 1 tablet a day for 3 days and 1/2 tablet for 4     days, then stop. 6. Albuterol nebulizer for shortness of breath every 4 hours as     needed. 7. Alprazolam 0.5 mg for anxiety as needed every 8 hours. 8. Aspirin 81 mg 1 tablet by mouth daily. 9. Lipitor 40 mg 1 tablet by mouth daily. 10.Nexium 40 mg 1 capsule by mouth twice daily. 11.Potassium chloride 20 mEq 1 tablet by mouth daily. 12.Albuterol inhaler 2 puffs as needed every 4 hours for shortness of     breath.  DISCHARGE INSTRUCTIONS:  The patient will be discharged home in the care of her family.  Activity will be as tolerated.  She is to increase activity slowly.  Diet is low sodium, heart healthy. FOLLOWUP APPOINTMENTS:  She has a primary care appointment with Dr. Holley Bouche on December 09, 2010, that is Friday at 12:15 p.m.  SUGGESTIONS FOR OUTPATIENT FOLLOWUP: 1. The patient has been started on new blood pressure medications     including clonidine, HCTZ, and diltiazem.  Her blood pressure will     need be monitored closely. 2. She had a low TSH this admission and will need outpatient followup. 3. The patient was admitted with asthma exacerbation although she     appears to be doing much better.  Follow up with her primary care     physician regarding her respiratory status is important.     Stephani Police, PA   ______________________________ Suzzanne Cloud, MD    MLY/MEDQ  D:  12/05/2010  T:  12/05/2010   Job:  [id number]  cc:   Holley Bouche, M.D.  Electronically Signed by Algis Downs PA on 12/06/2010 06:29:52 PM Electronically Signed by Ebony Cargo MD on 01/11/2011 03:35:52 PM

## 2011-01-11 NOTE — H&P (Signed)
NAMEDHRUVI, CRENSHAW NO.:  [id number]  MEDICAL RECORD NO.:  [id number]  LOCATION:  [facility]                         FACILITY:  [facility]  PHYSICIAN:  Mickell Birdwell I Zaraya Delauder, MD      DATE OF BIRTH:  12-20-1950  DATE OF ADMISSION:  12/02/2010 DATE OF DISCHARGE:                             HISTORY & PHYSICAL   PRIMARY CARE PHYSICIAN:  Dr. [name].  CHIEF COMPLAINT:  Shortness of breath and cough.  HISTORY OF PRESENT ILLNESS:  This is a 60 year old very pleasant African American female with history of asthma/COPD and bronchitis.  She was evaluated previously with pulmonary function tests which did not match the picture of COPD and asthma.  The patient admitted her last episode of COPD and asthma exacerbation was 2 years ago.  She was doing really fine with hard breathing with no exacerbation.  She presented for the last 4 days with progressive shortness of breath and wheezing, productive cough of yellowish sputum associated with chest tightness. Her neb treatment with no significant change.  She has to see her primary care physician with prednisone prescribed for the patient.  On presentation to the emergency room, the patient received nebulizer treatment with some improvement on her shortness of breath and chest tightness.  The patient also complained of some left shoulder pain and muscle pain for couple of days.  The patient denies any fever, denies any orthopnea or paroxysmal nocturnal dyspnea.  Denies any nausea, vomiting or abdominal pain.  Denies any change in her bowel habit. Chief complaint of headache after receiving her next treatment.  Also she felt heart racing after receiving the albuterol treatment.  ALLERGIES:  No known drug allergies.  MEDICATIONS:  To be reconciled from the pharmacy.  PAST SURGICAL HISTORY: 1. History of hysterectomy. 2. Hernia. 3. Foot surgery. 4. Hemorrhoidectomy.  SOCIAL HISTORY:  The patient used to smoke 1 pack per day for  15 years, she quit since 2005.  She denies any alcohol or drug abuse.  She is married and lives with her husband.  REVIEW OF SYSTEMS:  Systemic review per HPI.  PHYSICAL EXAMINATION:  VITAL SIGNS:  Temperature 98.1, blood pressure 192/97, the patient admit she did not take her medication today, pulse rate 99, respiratory rate 22, and oxygen saturation 99% on room air. GENERAL:  Looks mild distress and no severe distress.  The patient does not use accessory muscle for breathing.  No central cyanosis. HEENT:  Pupils are equal and reactive to light and accommodation. Extraocular muscle movement was normal. NECK:  Supple.  No lymphadenopathy. HEART:  S1, S2.  Tachycardic.  Irregularly irregular.  No gallop or murmur. LUNGS:  Normal vesicular breathing with equal air entry at the base. ABDOMEN:  Soft, nontender.  Bowel sounds positive. EXTREMITIES:  No lower limb edema.  Peripheral pulses intact. CNS:  The patient is awake, alert, and oriented x3 with no focal neurological deficits. JOINT:  I do not see any evidence of joint swelling, but she has some tenderness at the left shoulder with some tenderness at the muscles. The patient cannot lift her left shoulder above her head from the shoulder pain.  LABORATORY DATA:  On  blood work, the patient's sodium 114, potassium 3.5, chloride 101, BUN 24, creatinine 1, glucose 125, calcium ionized 1.19.  Hemoglobin 13.3, hematocrit 39, and white blood cells 14.7.  IMAGING DATA:  Chest x-ray unchanged cardiac silhouette and mediastinal contour.  No focal parenchymal opacity, no pleural effusion or pneumothorax as it was.  EKG did show frequent multiple SVT.  ASSESSMENT AND PLAN: 1. Episodes of shortness of breath and cough could be related to the     history of bronchitis/asthma and chronic obstructive pulmonary     disease.  The patient will be admitted to telemetry.  We will start     the patient on Solu-Medrol, the patient has previous  consultation     by Dr. Delford Field in 2010.  We will proceed with avoiding ARBs and ACE     inhibitor at this time that may alleviate the patient's cough.  We     will start the patient Clonidine 0.1 mg p.o. b.i.d.  Nebs treatment     and Solu-Medrol. 2. Hypertension.  We will hold Diovan.  We will place the patient on     Clonidine and hydrochlorothiazide. 3. Left shoulder pain could be likely muscular.  We will get total CK     and x-ray of the left shoulder.  Pain management at this time. 4. DVT and GI prophylaxis.  Further recommendations as hospital course progress.     Karn Derk Bosie Helper, MD     HIE/MEDQ  D:  12/02/2010  T:  12/02/2010  Job:  [id number]  Electronically Signed by Ebony Cargo MD on 01/11/2011 03:35:48 PM

## 2011-03-31 ENCOUNTER — Ambulatory Visit (HOSPITAL_COMMUNITY)
Admission: RE | Admit: 2011-03-31 | Discharge: 2011-03-31 | Disposition: A | Discharge status: Home / Self Care | Payer: Private Health Insurance - Indemnity | Source: Ambulatory Visit | Attending: Family Medicine | Admitting: Family Medicine

## 2011-03-31 DIAGNOSIS — R609 Edema, unspecified: Secondary | ICD-10-CM

## 2011-03-31 DIAGNOSIS — M7989 Other specified soft tissue disorders: Secondary | ICD-10-CM | POA: Insufficient documentation

## 2011-03-31 DIAGNOSIS — M79609 Pain in unspecified limb: Secondary | ICD-10-CM | POA: Insufficient documentation

## 2011-03-31 NOTE — Progress Notes (Signed)
*  PRELIMINARY RESULTS* Right lower extremity venous duplex completed. No evidence of DVT, superficial thrombosis , or Baker's cystMilta [name], [place] D 03/31/2011, 10:34 PM

## 2011-08-09 ENCOUNTER — Encounter (HOSPITAL_COMMUNITY): Payer: Self-pay | Admitting: *Deleted

## 2011-08-09 ENCOUNTER — Inpatient Hospital Stay (HOSPITAL_COMMUNITY)
Admission: AD | Admit: 2011-08-09 | Discharge: 2011-08-11 | DRG: 203 | Disposition: A | Discharge status: Home / Self Care | Payer: Managed Care, Other (non HMO) | Attending: Internal Medicine | Admitting: Internal Medicine

## 2011-08-09 ENCOUNTER — Emergency Department (HOSPITAL_COMMUNITY): Payer: Managed Care, Other (non HMO)

## 2011-08-09 DIAGNOSIS — Z7982 Long term (current) use of aspirin: Secondary | ICD-10-CM

## 2011-08-09 DIAGNOSIS — J309 Allergic rhinitis, unspecified: Secondary | ICD-10-CM | POA: Diagnosis present

## 2011-08-09 DIAGNOSIS — J45901 Unspecified asthma with (acute) exacerbation: Principal | ICD-10-CM | POA: Diagnosis present

## 2011-08-09 DIAGNOSIS — E785 Hyperlipidemia, unspecified: Secondary | ICD-10-CM | POA: Diagnosis present

## 2011-08-09 DIAGNOSIS — E1142 Type 2 diabetes mellitus with diabetic polyneuropathy: Secondary | ICD-10-CM | POA: Diagnosis present

## 2011-08-09 DIAGNOSIS — I10 Essential (primary) hypertension: Secondary | ICD-10-CM | POA: Diagnosis present

## 2011-08-09 DIAGNOSIS — E1149 Type 2 diabetes mellitus with other diabetic neurological complication: Secondary | ICD-10-CM | POA: Diagnosis present

## 2011-08-09 DIAGNOSIS — E7800 Pure hypercholesterolemia, unspecified: Secondary | ICD-10-CM | POA: Diagnosis present

## 2011-08-09 DIAGNOSIS — E1165 Type 2 diabetes mellitus with hyperglycemia: Secondary | ICD-10-CM | POA: Diagnosis present

## 2011-08-09 DIAGNOSIS — IMO0002 Reserved for concepts with insufficient information to code with codable children: Secondary | ICD-10-CM | POA: Diagnosis present

## 2011-08-09 HISTORY — DX: Essential (primary) hypertension: I10

## 2011-08-09 HISTORY — DX: Hyperlipidemia, unspecified: E78.5

## 2011-08-09 LAB — CBC
HCT: 37.9 % (ref 36.0–46.0)
Hemoglobin: 13.1 g/dL (ref 12.0–15.0)
MCV: 90.7 fL (ref 78.0–100.0)
WBC: 9.3 10*3/uL (ref 4.0–10.5)

## 2011-08-09 LAB — DIFFERENTIAL
Band Neutrophils: 0 % (ref 0–10)
Basophils Absolute: 0.1 10*3/uL (ref 0.0–0.1)
Basophils Relative: 1 % (ref 0–1)
Eosinophils Absolute: 0.3 10*3/uL (ref 0.0–0.7)
Lymphocytes Relative: 30 % (ref 12–46)
Lymphs Abs: 2.8 10*3/uL (ref 0.7–4.0)
Metamyelocytes Relative: 0 %
Monocytes Absolute: 0.4 10*3/uL (ref 0.1–1.0)
Monocytes Relative: 4 % (ref 3–12)

## 2011-08-09 LAB — BASIC METABOLIC PANEL
Calcium: 9.1 mg/dL (ref 8.4–10.5)
Creatinine, Ser: 0.81 mg/dL (ref 0.50–1.10)
GFR calc Af Amer: 88 mL/min — ABNORMAL LOW (ref >90)
GFR calc non Af Amer: 76 mL/min — ABNORMAL LOW (ref >90)
Sodium: 139 mEq/L (ref 135–145)

## 2011-08-09 MED ORDER — INSULIN ASPART 100 UNIT/ML ~~LOC~~ SOLN
0.0000 [IU] | Freq: Every day | SUBCUTANEOUS | Status: DC
  Administered 2011-08-10: 2 [IU] via SUBCUTANEOUS

## 2011-08-09 MED ORDER — POTASSIUM CHLORIDE IN NACL 20-0.9 MEQ/L-% IV SOLN
INTRAVENOUS | Status: DC
  Administered 2011-08-10: 01:00:00 via INTRAVENOUS
  Filled 2011-08-09 (×2): qty 1000

## 2011-08-09 MED ORDER — ALBUTEROL SULFATE (5 MG/ML) 0.5% IN NEBU
INHALATION_SOLUTION | RESPIRATORY_TRACT | Status: AC
  Administered 2011-08-09: 19:00:00
  Filled 2011-08-09: qty 0.5

## 2011-08-09 MED ORDER — IRBESARTAN 300 MG PO TABS
300.0000 mg | ORAL_TABLET | Freq: Every day | ORAL | Status: DC
  Administered 2011-08-10 – 2011-08-11 (×2): 300 mg via ORAL
  Filled 2011-08-09 (×2): qty 1

## 2011-08-09 MED ORDER — ATORVASTATIN CALCIUM 40 MG PO TABS
40.0000 mg | ORAL_TABLET | Freq: Every day | ORAL | Status: DC
  Administered 2011-08-10 – 2011-08-11 (×2): 40 mg via ORAL
  Filled 2011-08-09 (×2): qty 1

## 2011-08-09 MED ORDER — ONDANSETRON HCL 4 MG/2ML IJ SOLN: 4.0000 mg | Freq: Four times a day (QID) | INTRAMUSCULAR | Status: DC | PRN

## 2011-08-09 MED ORDER — IPRATROPIUM BROMIDE 0.02 % IN SOLN
0.5000 mg | RESPIRATORY_TRACT | Status: DC | PRN
  Administered 2011-08-10 (×3): 0.5 mg via RESPIRATORY_TRACT
  Filled 2011-08-09 (×3): qty 2.5

## 2011-08-09 MED ORDER — INSULIN ASPART 100 UNIT/ML ~~LOC~~ SOLN
0.0000 [IU] | Freq: Three times a day (TID) | SUBCUTANEOUS | Status: DC
  Administered 2011-08-10: 3 [IU] via SUBCUTANEOUS
  Administered 2011-08-10: 5 [IU] via SUBCUTANEOUS
  Administered 2011-08-10 – 2011-08-11 (×3): 3 [IU] via SUBCUTANEOUS

## 2011-08-09 MED ORDER — MAGNESIUM SULFATE 40 MG/ML IJ SOLN
2.0000 g | INTRAMUSCULAR | Status: AC
  Administered 2011-08-09: 2 g via INTRAVENOUS
  Filled 2011-08-09: qty 50

## 2011-08-09 MED ORDER — SODIUM CHLORIDE 0.9 % IJ SOLN
3.0000 mL | Freq: Two times a day (BID) | INTRAMUSCULAR | Status: DC
  Administered 2011-08-10 – 2011-08-11 (×3): 3 mL via INTRAVENOUS

## 2011-08-09 MED ORDER — METFORMIN HCL 500 MG PO TABS
1000.0000 mg | ORAL_TABLET | Freq: Two times a day (BID) | ORAL | Status: DC
  Administered 2011-08-10 – 2011-08-11 (×3): 1000 mg via ORAL
  Filled 2011-08-09 (×6): qty 2

## 2011-08-09 MED ORDER — POTASSIUM CHLORIDE CRYS ER 20 MEQ PO TBCR
20.0000 meq | EXTENDED_RELEASE_TABLET | Freq: Every day | ORAL | Status: DC
  Administered 2011-08-10: 20 meq via ORAL
  Filled 2011-08-09 (×2): qty 1

## 2011-08-09 MED ORDER — ALBUTEROL SULFATE (5 MG/ML) 0.5% IN NEBU
2.5000 mg | INHALATION_SOLUTION | RESPIRATORY_TRACT | Status: DC | PRN
  Administered 2011-08-10: 2.5 mg via RESPIRATORY_TRACT
  Filled 2011-08-09: qty 0.5

## 2011-08-09 MED ORDER — ESTRADIOL 1 MG PO TABS
1.0000 mg | ORAL_TABLET | Freq: Every day | ORAL | Status: DC
  Administered 2011-08-10 – 2011-08-11 (×2): 1 mg via ORAL
  Filled 2011-08-09 (×2): qty 1

## 2011-08-09 MED ORDER — POTASSIUM CHLORIDE CRYS ER 20 MEQ PO TBCR
80.0000 meq | EXTENDED_RELEASE_TABLET | Freq: Once | ORAL | Status: AC
  Administered 2011-08-09: 80 meq via ORAL
  Filled 2011-08-09: qty 4

## 2011-08-09 MED ORDER — METHYLPREDNISOLONE SODIUM SUCC 125 MG IJ SOLR
60.0000 mg | Freq: Two times a day (BID) | INTRAMUSCULAR | Status: DC
  Administered 2011-08-10 – 2011-08-11 (×4): 60 mg via INTRAVENOUS
  Filled 2011-08-09 (×2): qty 0.96
  Filled 2011-08-09: qty 2
  Filled 2011-08-09 (×2): qty 0.96
  Filled 2011-08-09: qty 2

## 2011-08-09 MED ORDER — BENZONATATE 100 MG PO CAPS
100.0000 mg | ORAL_CAPSULE | Freq: Three times a day (TID) | ORAL | Status: DC | PRN
  Filled 2011-08-09: qty 1

## 2011-08-09 MED ORDER — ENOXAPARIN SODIUM 40 MG/0.4ML ~~LOC~~ SOLN
40.0000 mg | SUBCUTANEOUS | Status: DC
  Administered 2011-08-10 – 2011-08-11 (×2): 40 mg via SUBCUTANEOUS
  Filled 2011-08-09 (×2): qty 0.4

## 2011-08-09 MED ORDER — ASPIRIN EC 81 MG PO TBEC
81.0000 mg | DELAYED_RELEASE_TABLET | Freq: Every day | ORAL | Status: DC
  Administered 2011-08-10 – 2011-08-11 (×2): 81 mg via ORAL
  Filled 2011-08-09 (×2): qty 1

## 2011-08-09 MED ORDER — METHYLPREDNISOLONE SODIUM SUCC 125 MG IJ SOLR
125.0000 mg | Freq: Once | INTRAMUSCULAR | Status: AC
  Administered 2011-08-09: 125 mg via INTRAVENOUS
  Filled 2011-08-09: qty 2

## 2011-08-09 MED ORDER — ONDANSETRON HCL 4 MG PO TABS: 4.0000 mg | ORAL_TABLET | Freq: Four times a day (QID) | ORAL | Status: DC | PRN

## 2011-08-09 MED ORDER — ALUM & MAG HYDROXIDE-SIMETH 200-200-20 MG/5ML PO SUSP: 30.0000 mL | Freq: Four times a day (QID) | ORAL | Status: DC | PRN

## 2011-08-09 MED ORDER — ALBUTEROL (5 MG/ML) CONTINUOUS INHALATION SOLN
10.0000 mg/h | INHALATION_SOLUTION | Freq: Once | RESPIRATORY_TRACT | Status: AC
  Administered 2011-08-09: 10 mg/h via RESPIRATORY_TRACT
  Filled 2011-08-09: qty 20

## 2011-08-09 MED ORDER — ALPRAZOLAM 0.5 MG PO TABS
0.5000 mg | ORAL_TABLET | Freq: Two times a day (BID) | ORAL | Status: DC | PRN
  Administered 2011-08-10 (×2): 0.5 mg via ORAL
  Filled 2011-08-09 (×2): qty 1

## 2011-08-09 MED ORDER — ALBUTEROL SULFATE (5 MG/ML) 0.5% IN NEBU
2.5000 mg | INHALATION_SOLUTION | Freq: Once | RESPIRATORY_TRACT | Status: AC
  Administered 2011-08-09: 2.5 mg via RESPIRATORY_TRACT

## 2011-08-09 NOTE — ED Notes (Signed)
Attempted to call report x 2.  Given name and number and nurse to call back in 10 min

## 2011-08-09 NOTE — ED Provider Notes (Signed)
On reevaluation patient is still wheezing and states she feels no better. Will give magnesium to see if that improves her symptoms.  Chest x-ray is within normal limits, CBC within normal limits, BMP with hypokalemia of 2.7 which is most likely due to all the albuterol. Will admit for further care.  Gwyneth Sprout, MD 08/09/11 2047

## 2011-08-09 NOTE — ED Notes (Signed)
PT reports Asthma  Attack  Started on Friday . TO day Pt reports  CP and SHOB . Home neb treatments are not helping .

## 2011-08-09 NOTE — ED Provider Notes (Signed)
History     CSN: [id number]  Arrival date & time 08/09/11  1845   First MD Initiated Contact with Patient 08/09/11 1949      Chief Complaint  Patient presents with  . Shortness of Breath    CP    (Consider location/radiation/quality/duration/timing/severity/associated sxs/prior treatment) Patient is a 61 y.o. female presenting with shortness of breath. The history is provided by the patient and a relative.  Shortness of Breath  Associated symptoms include shortness of breath.   patient here with shortness of breath for the past 2-3 days. History of asthma has been using her inhaler without relief. Has been hospitalized for asthma in the past and excuse associated with seasonal allergies. Patient has a home nebulizer and that also has not been effective. No fever and her cough has been productive. She does smoke cigarettes. She has not been on corticosteroids for sometime  Past Medical History  Diagnosis Date  . Asthma   . Diabetes mellitus   . Hypertension     History reviewed. No pertinent past surgical history.  No family history on file.  History  Substance Use Topics  . Smoking status: Never Smoker   . Smokeless tobacco: Not on file  . Alcohol Use: No    OB History    Grav Para Term Preterm Abortions TAB SAB Ect Mult Living                  Review of Systems  Respiratory: Positive for shortness of breath.   All other systems reviewed and are negative.    Allergies  Amlodipine besylate; Lisinopril; and Pantoprazole sodium  Home Medications   Current Outpatient Rx  Name Route Sig Dispense Refill  . ALBUTEROL SULFATE HFA 108 (90 BASE) MCG/ACT IN AERS Inhalation Inhale 2 puffs into the lungs every 4 (four) hours as needed. For shortness of breath    . ALBUTEROL SULFATE (2.5 MG/3ML) 0.083% IN NEBU Nebulization Take 2.5 mg by nebulization every 4 (four) hours as needed. For shortness of breath    . ALPRAZOLAM 0.5 MG PO TABS Oral Take 0.5 mg by mouth 2 (two)  times daily as needed. For anxiety    . ASPIRIN EC 81 MG PO TBEC Oral Take 81 mg by mouth daily.    . ATORVASTATIN CALCIUM 40 MG PO TABS Oral Take 40 mg by mouth daily.    Marland Kitchen ESOMEPRAZOLE MAGNESIUM 40 MG PO CPDR Oral Take 40 mg by mouth daily before breakfast.    . ESTRADIOL 1 MG PO TABS Oral Take 1 mg by mouth daily.    Marland Kitchen METFORMIN HCL 1000 MG PO TABS Oral Take 1,000 mg by mouth 2 (two) times daily with a meal.    . POTASSIUM CHLORIDE CRYS ER 20 MEQ PO TBCR Oral Take 20 mEq by mouth daily.    Marland Kitchen VALSARTAN-HYDROCHLOROTHIAZIDE 320-25 MG PO TABS Oral Take 1 tablet by mouth daily.      BP 158/74  Pulse 107  Temp(Src) 98.4 F (36.9 C) (Oral)  SpO2 99%  Physical Exam  Nursing note and vitals reviewed. Constitutional: She is oriented to person, place, and time. She appears well-developed and well-nourished.  Non-toxic appearance. No distress.  HENT:  Head: Normocephalic and atraumatic.  Eyes: Conjunctivae, EOM and lids are normal. Pupils are equal, round, and reactive to light.  Neck: Normal range of motion. Neck supple. No tracheal deviation present. No mass present.  Cardiovascular: Regular rhythm and normal heart sounds.  Tachycardia present.  Exam reveals no  gallop.   No murmur heard. Pulmonary/Chest: Effort normal. No stridor. No respiratory distress. She has no decreased breath sounds. She has wheezes. She has no rhonchi. She has no rales.  Abdominal: Soft. Normal appearance and bowel sounds are normal. She exhibits no distension. There is no tenderness. There is no rebound and no CVA tenderness.  Musculoskeletal: Normal range of motion. She exhibits no edema and no tenderness.  Neurological: She is alert and oriented to person, place, and time. She has normal strength. No cranial nerve deficit or sensory deficit. GCS eye subscore is 4. GCS verbal subscore is 5. GCS motor subscore is 6.  Skin: Skin is warm and dry. No abrasion and no rash noted.  Psychiatric: She has a normal mood and  affect. Her speech is normal and behavior is normal.    ED Course  Procedures (including critical care time)   Labs Reviewed  CBC  DIFFERENTIAL  BASIC METABOLIC PANEL   No results found.   No diagnosis found.    MDM  Patient given albuterol treatment here x1 refill slightly better. We'll order Solu-Medrol and continuous nebulizer. Patient's labs and x-rays are pending at this time. She has been signed out to Dr. Anitra Lauth who will disposition patient        Toy Baker, MD 08/09/11 [phone]

## 2011-08-09 NOTE — ED Notes (Signed)
Attempted to call report x 1.  Given name and number and nurse Revonda Standard) to call back.

## 2011-08-09 NOTE — H&P (Signed)
PCP:   Jacqueline Blamer, MD, MD   Chief Complaint:  Shortness of breath  HPI: This is a 61 year old female with known history of asthma. She states that Thursday she started having diarrhea and abdominal pains. This finally resolved yesterday. On Friday she had diarrhea and developed shortness of breath. The shortness of breath continued to worsen, she had a dry cough and wheezing. She started using her nebulizers every 3-4 hours and she was also using the inhaler in between. This provided no improvement. She denies any fevers, she states she did have some chills. She finally came to the ER. In the ER she has had two, one hour-long nebs and continues to wheeze. The hospitalist service was called to admit. During my interview the patient had audible wheezing, however, she is able to provide history. Patient is alert and oriented. Family members are at bedside. Patient states she normally has a asthma exacerbation once annually.  Review of Systems:positives bolded  anorexia, fever, weight loss,, vision loss, decreased hearing, hoarseness, chest pain, syncope, dyspnea on exertion, peripheral edema, balance deficits, hemoptysis, abdominal pain, melena, hematochezia, severe indigestion/heartburn, hematuria, incontinence, genital sores, muscle weakness, suspicious skin lesions, transient blindness, difficulty walking, depression, unusual weight change, abnormal bleeding, enlarged lymph nodes, angioedema, and breast masses.  Past Medical History: Past Medical History  Diagnosis Date  . Asthma   . Diabetes mellitus   . Hypertension   . Dyslipidemia    Past Surgical History  Procedure Date  . Abdominal hysterectomy   . Hernia repair   . Knee arthroscopy     Medications: Prior to Admission medications   Medication Sig Start Date End Date Taking? Authorizing Provider  albuterol (PROVENTIL HFA;VENTOLIN HFA) 108 (90 BASE) MCG/ACT inhaler Inhale 2 puffs into the lungs every 4 (four) hours as needed.  For shortness of breath   Yes Historical Provider, MD  albuterol (PROVENTIL) (2.5 MG/3ML) 0.083% nebulizer solution Take 2.5 mg by nebulization every 4 (four) hours as needed. For shortness of breath   Yes Historical Provider, MD  ALPRAZolam (XANAX) 0.5 MG tablet Take 0.5 mg by mouth 2 (two) times daily as needed. For anxiety   Yes Historical Provider, MD  aspirin EC 81 MG tablet Take 81 mg by mouth daily.   Yes Historical Provider, MD  atorvastatin (LIPITOR) 40 MG tablet Take 40 mg by mouth daily.   Yes Historical Provider, MD  esomeprazole (NEXIUM) 40 MG capsule Take 40 mg by mouth daily before breakfast.   Yes Historical Provider, MD  estradiol (ESTRACE) 1 MG tablet Take 1 mg by mouth daily.   Yes Historical Provider, MD  metFORMIN (GLUCOPHAGE) 1000 MG tablet Take 1,000 mg by mouth 2 (two) times daily with a meal.   Yes Historical Provider, MD  potassium chloride SA (K-DUR,KLOR-CON) 20 MEQ tablet Take 20 mEq by mouth daily.   Yes Historical Provider, MD  valsartan-hydrochlorothiazide (DIOVAN-HCT) 320-25 MG per tablet Take 1 tablet by mouth daily.   Yes Historical Provider, MD    Allergies:   Allergies  Allergen Reactions  . Amlodipine Besylate Other (See Comments)    Reaction unknown  . Lisinopril Other (See Comments)    Reaction unknown  . Pantoprazole Sodium Other (See Comments)    Reaction unknown    Social History:  reports that she has never smoked. She does not have any smokeless tobacco history on file. She reports that she does not drink alcohol or use illicit drugs.  Family History: Family History  Problem Relation Age of  Onset  . Diabetes type II    . Hypertension      Physical Exam: Filed Vitals:   08/09/11 1846 08/09/11 2014 08/09/11 2015 08/09/11 2023  BP: 158/74  145/81   Pulse: 107  98   Temp: 98.4 F (36.9 C)  97 F (36.1 C)   TempSrc: Oral  Oral   Resp:   20   SpO2: 99% 99% 100% 100%    General:  Alert and oriented times three, well developed and  nourished, no acute distress Eyes: PERRLA, pink conjunctiva, no scleral icterus ENT: Moist oral mucosa, neck supple, no thyromegaly, wheezing bilateral neck Lungs: clear to ascultation,Generalized wheezing, no crackles,some use of accessory muscles Cardiovascular: regular rate and rhythm, no regurgitation, no gallops, no murmurs. No carotid bruits, no JVD Abdomen: soft, positive BS, non-tender, non-distended, no organomegaly, not an acute abdomen GU: not examined Neuro: CN II - XII grossly intact, sensation intact Musculoskeletal: strength 5/5 all extremities, no clubbing, cyanosis or edema Skin: no rash, no subcutaneous crepitation, no decubitus Psych: appropriate patient   Labs on Admission:   [facility] 08/09/11 1953  NA 139  K 2.7*  CL 102  CO2 27  GLUCOSE 160*  BUN 20  CREATININE 0.81  CALCIUM 9.1  MG --  PHOS --   No results found for this basename: AST:2,ALT:2,ALKPHOS:2,BILITOT:2,PROT:2,ALBUMIN:2 in the last 72 hours No results found for this basename: LIPASE:2,AMYLASE:2 in the last 72 hours  Basename 08/09/11 1953  WBC 9.3  NEUTROABS 5.7  HGB 13.1  HCT 37.9  MCV 90.7  PLT 264   No results found for this basename: CKTOTAL:3,CKMB:3,CKMBINDEX:3,TROPONINI:3 in the last 72 hours No components found with this basename: POCBNP:3 No results found for this basename: DDIMER:2 in the last 72 hours No results found for this basename: HGBA1C:2 in the last 72 hours No results found for this basename: CHOL:2,HDL:2,LDLCALC:2,TRIG:2,CHOLHDL:2,LDLDIRECT:2 in the last 72 hours No results found for this basename: TSH,T4TOTAL,FREET3,T3FREE,THYROIDAB in the last 72 hours No results found for this basename: VITAMINB12:2,FOLATE:2,FERRITIN:2,TIBC:2,IRON:2,RETICCTPCT:2 in the last 72 hours  Micro Results: No results found for this or any previous visit (from the past 240 hour(s)).   Radiological Exams on Admission: Dg Chest 2 View  08/09/2011  *RADIOLOGY REPORT*  Clinical Data:  Asthma.  Hypertension.  CHEST - 2 VIEW  Comparison: 12/02/2010  Findings: Heart size is normal.  The aorta is unfolded.  The lungs are clear.  Vascularity is normal.  No effusions.  No bony abnormalities.  IMPRESSION: No active disease  Original Report Authenticated By: Thomasenia Sales, M.D.    Assessment/Plan Present on Admission:  .Asthma exacerbation/tracheobronchitis Admit to telemetry Continue IV Solu-Medrol, nebulizers when necessary, Tessalon Perles  Oxygen keep sats greater than 90% Claritin daily .DIABETES MELLITUS, TYPE II .ALLERGIC RHINITIS .DIABETIC PERIPHERAL NEUROPATHY .HYPERCHOLESTEROLEMIA .HYPERTENSION Stable resume home medications, careful for hyperglycemia with steroids   Full code DVT prophylaxis T8/Dr. Charlsie Merles, Alphonsa Brickle 08/09/2011, 10:21 PM

## 2011-08-09 NOTE — ED Notes (Signed)
2.7 Potassium, [place]unk[place], MD notified

## 2011-08-10 LAB — CBC
HCT: 36.3 % (ref 36.0–46.0)
HCT: 36.5 % (ref 36.0–46.0)
Hemoglobin: 12.1 g/dL (ref 12.0–15.0)
Hemoglobin: 12.5 g/dL (ref 12.0–15.0)
MCH: 30.6 pg (ref 26.0–34.0)
MCH: 31.3 pg (ref 26.0–34.0)
MCHC: 33.3 g/dL (ref 30.0–36.0)
MCHC: 34.2 g/dL (ref 30.0–36.0)
MCV: 91.3 fL (ref 78.0–100.0)
MCV: 91.7 fL (ref 78.0–100.0)
Platelets: 252 K/uL (ref 150–400)
Platelets: 264 K/uL (ref 150–400)
RBC: 3.96 MIL/uL (ref 3.87–5.11)
RBC: 4 MIL/uL (ref 3.87–5.11)
RDW: 12.9 % (ref 11.5–15.5)
RDW: 13 % (ref 11.5–15.5)
WBC: 7.9 K/uL (ref 4.0–10.5)
WBC: 9.7 K/uL (ref 4.0–10.5)

## 2011-08-10 LAB — BASIC METABOLIC PANEL
BUN: 16 mg/dL (ref 6–23)
CO2: 22 mEq/L (ref 19–32)
Chloride: 103 mEq/L (ref 96–112)
GFR calc Af Amer: >90 mL/min (ref >90)
Potassium: 3.4 mEq/L — ABNORMAL LOW (ref 3.5–5.1)

## 2011-08-10 LAB — GLUCOSE, CAPILLARY
Glucose-Capillary: 194 mg/dL — ABNORMAL HIGH (ref 70–99)
Glucose-Capillary: 170 mg/dL — ABNORMAL HIGH (ref 70–99)
Glucose-Capillary: 232 mg/dL — ABNORMAL HIGH (ref 70–99)

## 2011-08-10 LAB — CREATININE, SERUM: GFR calc non Af Amer: 90 mL/min — ABNORMAL LOW (ref >90)

## 2011-08-10 MED ORDER — NON FORMULARY: 40.0000 mg | Freq: Two times a day (BID) | Status: DC

## 2011-08-10 MED ORDER — LEVOFLOXACIN 500 MG PO TABS
500.0000 mg | ORAL_TABLET | Freq: Every day | ORAL | Status: DC
  Administered 2011-08-10 – 2011-08-11 (×2): 500 mg via ORAL
  Filled 2011-08-10 (×2): qty 1

## 2011-08-10 MED ORDER — POTASSIUM CHLORIDE CRYS ER 20 MEQ PO TBCR
20.0000 meq | EXTENDED_RELEASE_TABLET | Freq: Once | ORAL | Status: AC
  Administered 2011-08-10: 20 meq via ORAL
  Filled 2011-08-10: qty 1

## 2011-08-10 MED ORDER — FUROSEMIDE 10 MG/ML IJ SOLN
20.0000 mg | Freq: Once | INTRAMUSCULAR | Status: AC
  Administered 2011-08-10: 20 mg via INTRAVENOUS
  Filled 2011-08-10: qty 2

## 2011-08-10 MED ORDER — ALUM & MAG HYDROXIDE-SIMETH 200-200-20 MG/5ML PO SUSP
30.0000 mL | Freq: Once | ORAL | Status: AC
  Administered 2011-08-10: 30 mL via ORAL
  Filled 2011-08-10: qty 30

## 2011-08-10 MED ORDER — ALBUTEROL SULFATE (5 MG/ML) 0.5% IN NEBU
2.5000 mg | INHALATION_SOLUTION | Freq: Four times a day (QID) | RESPIRATORY_TRACT | Status: DC
  Administered 2011-08-10 – 2011-08-11 (×4): 2.5 mg via RESPIRATORY_TRACT
  Filled 2011-08-10 (×4): qty 0.5

## 2011-08-10 MED ORDER — ESOMEPRAZOLE MAGNESIUM 40 MG PO CPDR
40.0000 mg | DELAYED_RELEASE_CAPSULE | Freq: Two times a day (BID) | ORAL | Status: DC
  Administered 2011-08-10 – 2011-08-11 (×2): 40 mg via ORAL
  Filled 2011-08-10 (×4): qty 1

## 2011-08-10 MED ORDER — ALBUTEROL SULFATE (5 MG/ML) 0.5% IN NEBU: 2.5000 mg | INHALATION_SOLUTION | RESPIRATORY_TRACT | Status: DC | PRN

## 2011-08-10 NOTE — Progress Notes (Addendum)
[name] CSN:[id number],MRN:[id number] is a 61 y.o. female,  Outpatient Primary MD for the patient is [name], MD, MD  Chief Complaint  Patient presents with  . Shortness of Breath    CP        Subjective:   [name] today has, No headache, No chest pain, No abdominal pain - No Nausea, No new weakness tingling or numbness, +Cough - SOB.    Objective:   Filed Vitals:   08/09/11 2237 08/10/11 0000 08/10/11 0023 08/10/11 0500  BP: 133/56 135/80  136/70  Pulse: 90 88 86 88  Temp: 97.8 F (36.6 C) 97.8 F (36.6 C)  97.6 F (36.4 C)  TempSrc: Oral Oral  Oral  Resp: 22 20 20 20   Height:  5\' 2"  (1.575 m)    Weight:  74.9 kg (165 lb 2 oz)    SpO2: 100% 100% 100% 100%    Wt Readings from Last 3 Encounters:  08/10/11 74.9 kg (165 lb 2 oz)  07/22/08 72.635 kg (160 lb 2.1 oz)    No intake or output data in the 24 hours ending 08/10/11 0937  Exam Awake Alert, Oriented *3, No new F.N deficits, Normal affect [place].AT,PERRAL Supple Neck,No JVD, No cervical lymphadenopathy appriciated.  Symmetrical Chest wall movement, Good air movement bilaterally, Bilat wheezing and rales RRR,No Gallops,Rubs or new Murmurs, No Parasternal Heave +ve B.Sounds, Abd Soft, Non tender, No organomegaly appriciated, No rebound -guarding or rigidity. No Cyanosis, Clubbing or edema, No new Rash or bruise     Data Review  CBC  Lab 08/10/11 0626 08/10/11 0006 08/09/11 1953  WBC 7.9 9.7 9.3  HGB 12.1 12.5 13.1  HCT 36.3 36.5 37.9  PLT 264 252 264  MCV 91.7 91.3 90.7  MCH 30.6 31.3 31.3  MCHC 33.3 34.2 34.6  RDW 13.0 12.9 12.9  LYMPHSABS -- -- 2.8  MONOABS -- -- 0.4  EOSABS -- -- 0.3  BASOSABS -- -- 0.1  BANDABS -- -- --    Chemistries   Lab 08/10/11 0626 08/10/11 0006 08/09/11 1953  NA 140 -- 139  K 3.4* -- 2.7*  CL 103 -- 102  CO2 22 -- 27  GLUCOSE 207* -- 160*  BUN 16 -- 20  CREATININE 0.67 0.73 0.81  CALCIUM 8.9 -- 9.1  MG 2.0 -- --  AST -- -- --  ALT -- -- --  ALKPHOS  -- -- --  BILITOT -- -- --   ------------------------------------------------------------------------------------------------------------------ estimated creatinine clearance is 69.1 ml/min (by C-G formula based on Cr of 0.67). ------------------------------------------------------------------------------------------------------------------ No results found for this basename: HGBA1C:2 in the last 72 hours ------------------------------------------------------------------------------------------------------------------ No results found for this basename: CHOL:2,HDL:2,LDLCALC:2,TRIG:2,CHOLHDL:2,LDLDIRECT:2 in the last 72 hours ------------------------------------------------------------------------------------------------------------------ No results found for this basename: TSH,T4TOTAL,FREET3,T3FREE,THYROIDAB in the last 72 hours ------------------------------------------------------------------------------------------------------------------ No results found for this basename: VITAMINB12:2,FOLATE:2,FERRITIN:2,TIBC:2,IRON:2,RETICCTPCT:2 in the last 72 hours  Coagulation profile No results found for this basename: INR:5,PROTIME:5 in the last 168 hours  No results found for this basename: DDIMER:2 in the last 72 hours  Cardiac Enzymes No results found for this basename: CK:3,CKMB:3,TROPONINI:3,MYOGLOBIN:3 in the last 168 hours ------------------------------------------------------------------------------------------------------------------ No components found with this basename: POCBNP:3  Micro Results No results found for this or any previous visit (from the past 240 hour(s)).  Radiology Reports Dg Chest 2 View  08/09/2011  *RADIOLOGY REPORT*  Clinical Data: Asthma.  Hypertension.  CHEST - 2 VIEW  Comparison: 12/02/2010  Findings: Heart size is normal.  The aorta is unfolded.  The lungs are clear.  Vascularity is  normal.  No effusions.  No bony abnormalities.  IMPRESSION: No active disease   Original Report Authenticated By: Thomasenia Sales, M.D.    Scheduled Meds:   . albuterol  2.5 mg Nebulization Once  . albuterol  2.5 mg Nebulization QID  . albuterol      . albuterol  10 mg/hr Nebulization Once  . alum & mag hydroxide-simeth  30 mL Oral Once  . aspirin EC  81 mg Oral Daily  . atorvastatin  40 mg Oral Daily  . enoxaparin  40 mg Subcutaneous Q24H  . estradiol  1 mg Oral Daily  . furosemide  20 mg Intravenous Once  . insulin aspart  0-15 Units Subcutaneous TID WC  . insulin aspart  0-5 Units Subcutaneous QHS  . irbesartan  300 mg Oral Daily  . levofloxacin  500 mg Oral Daily  . magnesium sulfate  2 g Intravenous To Minor  . metFORMIN  1,000 mg Oral BID WC  . methylPREDNISolone (SOLU-MEDROL) injection  125 mg Intravenous Once  . methylPREDNISolone (SOLU-MEDROL) injection  60 mg Intravenous Q12H  . potassium chloride SA  20 mEq Oral Daily  . potassium chloride  20 mEq Oral Once  . potassium chloride  80 mEq Oral Once  . sodium chloride  3 mL Intravenous Q12H   Continuous Infusions:   . DISCONTD: 0.9 % NaCl with KCl 20 mEq / L 75 mL/hr at 08/10/11 0050   PRN Meds:.albuterol, albuterol, ALPRAZolam, alum & mag hydroxide-simeth, benzonatate, ipratropium, ondansetron (ZOFRAN) IV, ondansetron  Assessment & Plan   1. Asthma excerbation - continue IV Steroids, add scheduled Nebs and PRN Nebs, o2 as needed, will add levaquin PO as contious dry cough for 2-3 days ? Mild Bronchitis.  Since few rales, stop IVF and check BNP.   2. DIABETES MELLITUS, TYPE II - continue glucophage and ISS, outpt A1c monitor.  CBG (last 3)   Basename 08/10/11 0759  GLUCAP 194*    3. H/O HYPERCHOLESTEROLEMIA & HYPERTENSION - home meds and outpt monitor.   4.Low K - replaced.   DVT Prophylaxis  Lovenox       Leroy Sea M.D on 08/10/2011 at 9:37 AM  [facility] Office  [phone]

## 2011-08-10 NOTE — Progress Notes (Signed)
   CARE MANAGEMENT NOTE 08/10/2011  Patient:  SHAKINAH, NAVIS   Account Number:  [id number]  Date Initiated:  08/10/2011  Documentation initiated by:  Onnie Boer  Subjective/Objective Assessment:   PT WAS ADMITTED WITH ASTHMA EXACERBATION     Action/Plan:   PROGRESSION OF CARE AND DISCHARGE PLANNING   Anticipated DC Date:  08/11/2011   Anticipated DC Plan:  HOME/SELF CARE      DC Planning Services  CM consult      Choice offered to / List presented to:             Status of service:  In process, will continue to follow Medicare Important Message given?   (If response is "NO", the following Medicare IM given date fields will be blank) Date Medicare IM given:   Date Additional Medicare IM given:    Discharge Disposition:    Per UR Regulation:  Reviewed for med. necessity/level of care/duration of stay  If discussed at Long Length of Stay Meetings, dates discussed:    Comments:  PCP: Dr Tiburcio Pea  08/10/2011  [address] RN, [place]  [phone] Met with patient regarding discharge planning. CM to continue to follow for discharge planning needs.  08/10/11 Onnie Boer, RN, BSN 1406 UR COMPLETED

## 2011-08-10 NOTE — Progress Notes (Signed)
   CARE MANAGEMENT NOTE 08/10/2011  Patient:  [name], [name]   Account Number:  [id number]  Date Initiated:  08/10/2011  Documentation initiated by:  Onnie Boer  Subjective/Objective Assessment:   PT WAS ADMITTED WITH ASTHMA EXACERBATION     Action/Plan:   PROGRESSION OF CARE AND DISCHARGE PLANNING   Anticipated DC Date:  08/11/2011   Anticipated DC Plan:  HOME/SELF CARE         Choice offered to / List presented to:             Status of service:  In process, will continue to follow Medicare Important Message given?   (If response is "NO", the following Medicare IM given date fields will be blank) Date Medicare IM given:   Date Additional Medicare IM given:    Discharge Disposition:    Per UR Regulation:  Reviewed for med. necessity/level of care/duration of stay  If discussed at Long Length of Stay Meetings, dates discussed:    Comments:  08/10/11 Onnie Boer, RN, BSN 1406 UR COMPLETED

## 2011-08-10 NOTE — Progress Notes (Deleted)
   CARE MANAGEMENT NOTE 08/10/2011  Patient:  [name],[name]   Account Number:  [id number]  Date Initiated:  08/10/2011  Documentation initiated by:  Natahlia Hoggard  Subjective/Objective Assessment:   Patient admitted with chest pain and difficulty breathing     Action/Plan:   Progression of care and discharge planning   Anticipated DC Date:  08/10/2011   Anticipated DC Plan:  HOME/SELF CARE      DC Planning Services  CM consult      Choice offered to / List presented to:             Status of service:  Completed, signed off Medicare Important Message given?   (If response is "NO", the following Medicare IM given date fields will be blank) Date Medicare IM given:   Date Additional Medicare IM given:    Discharge Disposition:    Per UR Regulation:    If discussed at Long Length of Stay Meetings, dates discussed:    Comments:  PCP: Dr Pharr  Met with patient to discuss discharge planning. He has follow up appointments with PCP and will schedule appointments with Cardio and Rheum.  No other discharge needs noted, his wife if primary caregiver and no problems with medication regimen.    

## 2011-08-11 LAB — POTASSIUM: Potassium: 4.7 mEq/L (ref 3.5–5.1)

## 2011-08-11 LAB — GLUCOSE, CAPILLARY

## 2011-08-11 MED ORDER — POTASSIUM CHLORIDE CRYS ER 20 MEQ PO TBCR
40.0000 meq | EXTENDED_RELEASE_TABLET | Freq: Once | ORAL | Status: AC
  Administered 2011-08-11: 40 meq via ORAL
  Filled 2011-08-11: qty 1

## 2011-08-11 MED ORDER — ALBUTEROL SULFATE HFA 108 (90 BASE) MCG/ACT IN AERS
2.0000 | INHALATION_SPRAY | RESPIRATORY_TRACT | Status: DC | PRN
Start: 2011-08-11 — End: 2011-08-11

## 2011-08-11 MED ORDER — ALBUTEROL SULFATE HFA 108 (90 BASE) MCG/ACT IN AERS
2.0000 | INHALATION_SPRAY | Freq: Three times a day (TID) | RESPIRATORY_TRACT | Status: DC
  Filled 2011-08-11: qty 6.7

## 2011-08-11 MED ORDER — PREDNISONE 5 MG PO TABS: ORAL_TABLET | ORAL | Status: DC

## 2011-08-11 MED ORDER — ALBUTEROL SULFATE HFA 108 (90 BASE) MCG/ACT IN AERS: 2.0000 | INHALATION_SPRAY | RESPIRATORY_TRACT | Status: DC | PRN

## 2011-08-11 MED ORDER — LEVOFLOXACIN 500 MG PO TABS: 500.0000 mg | ORAL_TABLET | Freq: Every day | ORAL | Status: AC

## 2011-08-11 NOTE — Discharge Summary (Signed)
[name], 61 y.o., DOB [birth year], MRN [id number]. Admission date: 08/09/2011 Discharge Date 08/11/2011 Primary MD [name], MD, MD Admitting Physician No admitting provider for patient encounter.  Admission Diagnosis  Asthma exacerbation [493.92] cp  Discharge Diagnosis   Principal Problem:  *Asthma exacerbation Active Problems:  DIABETES MELLITUS, TYPE II  DIABETIC PERIPHERAL NEUROPATHY  HYPERCHOLESTEROLEMIA  HYPERTENSION  ALLERGIC RHINITIS    Past Medical History  Diagnosis Date  . Asthma   . Diabetes mellitus   . Hypertension   . Dyslipidemia     Past Surgical History  Procedure Date  . Abdominal hysterectomy   . Hernia repair   . Knee arthroscopy      Hospital Course See H&P, Labs, Consult and Test reports for all details in brief, patient was admitted for     1. Asthma excerbation - much better after IV Steroids and Nebs, now 98% on RA bedside, minimal wheezing, will place on steroid taper and release home.   2. DIABETES MELLITUS, TYPE II - continue glucophage and  outpt A1c monitor.    3. H/O HYPERCHOLESTEROLEMIA & HYPERTENSION - home meds and outpt monitor.    4.Low K - replaced top NML today requested to hold KDur home Med for next 2 days, please repeat BMP in 3 days.      Significant Tests:  See full reports for all details     Dg Chest 2 View  08/09/2011  *RADIOLOGY REPORT*  Clinical Data: Asthma.  Hypertension.  CHEST - 2 VIEW  Comparison: 12/02/2010  Findings: Heart size is normal.  The aorta is unfolded.  The lungs are clear.  Vascularity is normal.  No effusions.  No bony abnormalities.  IMPRESSION: No active disease  Original Report Authenticated By: Thomasenia Sales, M.D.     Today   Subjective:   [name] today has no headache,no chest abdominal pain,no new weakness tingling or numbness, feels much better wants to go home today.   Objective:   Blood pressure 159/84, pulse 99, temperature 97.8 F (36.6 C), temperature  source Oral, resp. rate 18, height 5\' 2"  (1.575 m), weight 75.297 kg (166 lb), SpO2 97.00%.  Intake/Output Summary (Last 24 hours) at 08/11/11 1340 Last data filed at 08/11/11 0923  Gross per 24 hour  Intake      3 ml  Output      0 ml  Net      3 ml    Exam Awake Alert, Oriented *3, No new F.N deficits, Normal affect [place].AT,PERRAL Supple Neck,No JVD, No cervical lymphadenopathy appriciated.  Symmetrical Chest wall movement, Good air movement bilaterally, minimal wheezing RRR,No Gallops,Rubs or new Murmurs, No Parasternal Heave +ve B.Sounds, Abd Soft, Non tender, No organomegaly appriciated, No rebound -guarding or rigidity. No Cyanosis, Clubbing or edema, No new Rash or bruise  Data Review      CBC w Diff: Lab Results  Component Value Date   WBC 7.9 08/10/2011   HGB 12.1 08/10/2011   HCT 36.3 08/10/2011   PLT 264 08/10/2011   LYMPHOPCT 30 08/09/2011   BANDSPCT 0 08/09/2011   MONOPCT 4 08/09/2011   EOSPCT 3 08/09/2011   BASOPCT 1 08/09/2011   CMP: Lab Results  Component Value Date   NA 140 08/10/2011   K 4.7 08/11/2011   CL 103 08/10/2011   CO2 22 08/10/2011   BUN 16 08/10/2011   CREATININE 0.67 08/10/2011   PROT 8.5* 12/03/2010   ALBUMIN 4.1 12/03/2010   BILITOT 0.3 12/03/2010  ALKPHOS 104 12/03/2010   AST 17 12/03/2010   ALT 29 12/03/2010  .   Discharge Instructions     Follow with Primary MD [name], MD, MD in 3 days , Hold your Potassium pill for the next 2 days.  Get  BMP, checked 3 days by Primary MD and again as instructed by your Primary MD. Get a 2 view Chest X ray done next visit.  Get Medicines reviewed and adjusted.  Please request your Prim.MD to go over all Hospital Tests and Procedure/Radiological results at the follow up, please get all Hospital records sent to your Prim MD by signing hospital release before you go home.  Follow-up Information    Follow up with [name], MD .         Activity: Fall precautions use walker/cane & assistance as  needed  Diet: Diabetic, Aspiration precautions.  For Heart failure patients - Check your Weight same time everyday, if you gain over 2 pounds, or you develop in leg swelling, experience more shortness of breath or chest pain, call your Primary MD immediately. Follow Cardiac Low Salt Diet and 1.8 lit/day fluid restriction.  Disposition Home  If you experience worsening of your admission symptoms, develop shortness of breath, life threatening emergency, suicidal or homicidal thoughts you must seek medical attention immediately by calling 911 or calling your MD immediately  if symptoms less severe.  You Must read complete instructions/literature along with all the possible adverse reactions/side effects for all the Medicines you take and that have been prescribed to you. Take any new Medicines after you have completely understood and accpet all the possible adverse reactions/side effects.   Do not drive if your were admitted for syncope or siezures until you have seen by Primary MD or a Neurologist and advised to drive.  Do not drive when taking Pain medications.    Do not take more than prescribed Pain, Sleep and Anxiety Medications  Special Instructions: If you have smoked or chewed Tobacco  in the last 2 yrs please stop smoking, stop any regular Alcohol  and or any Recreational drug use.  Wear Seat belts while driving.  Follow-up Information    Follow up with [name], MD .         Discharge Medications   Medication List  As of 08/11/2011  1:40 PM   START taking these medications         levofloxacin 500 MG tablet   Commonly known as: LEVAQUIN   Take 1 tablet (500 mg total) by mouth daily.      predniSONE 5 MG tablet   Commonly known as: DELTASONE   Label  & dispense according to the schedule below. 10 Pills PO for 3 days then, 8 Pills PO for 3 days, 6 Pills PO for 3 days, 4 Pills PO for 3 days, 2 Pills PO for 3 days, 1 Pills PO for 3 days, 1/2 Pill  PO for 3 days then  STOP.         CONTINUE taking these medications         * albuterol (2.5 MG/3ML) 0.083% nebulizer solution   Commonly known as: PROVENTIL      * albuterol 108 (90 BASE) MCG/ACT inhaler   Commonly known as: PROVENTIL HFA;VENTOLIN HFA   Inhale 2 puffs into the lungs every 4 (four) hours as needed. For shortness of breath      ALPRAZolam 0.5 MG tablet   Commonly known as: Prudy Feeler  aspirin EC 81 MG tablet      atorvastatin 40 MG tablet   Commonly known as: LIPITOR      esomeprazole 40 MG capsule   Commonly known as: NEXIUM      estradiol 1 MG tablet   Commonly known as: ESTRACE      metFORMIN 1000 MG tablet   Commonly known as: GLUCOPHAGE      potassium chloride SA 20 MEQ tablet   Commonly known as: K-DUR,KLOR-CON      valsartan-hydrochlorothiazide 320-25 MG per tablet   Commonly known as: DIOVAN-HCT     * Notice: This list has 2 medication(s) that are the same as other medications prescribed for you. Read the directions carefully, and ask your doctor or other care provider to review them with you.        Where to get your medications    These are the prescriptions that you need to pick up.   You may get these medications from any pharmacy.         albuterol 108 (90 BASE) MCG/ACT inhaler   levofloxacin 500 MG tablet   predniSONE 5 MG tablet             Total Time in preparing paper work, data evaluation and todays exam - 35 minutes  Susa Raring K M.D on 08/11/2011 at 1:40 PM  [facility] Office  [phone]

## 2011-08-11 NOTE — Discharge Instructions (Signed)
Follow with Primary MD Jacqueline Blamer, MD, MD in 3 days , Hold your Potassium pill for the next 2 days.  Get BMP, checked 3 days by Primary MD and again as instructed by your Primary MD. Get a 2 view Chest X ray done next visit.  Get Medicines reviewed and adjusted.  Please request your Prim.MD to go over all Hospital Tests and Procedure/Radiological results at the follow up, please get all Hospital records sent to your Prim MD by signing hospital release before you go home.  Follow-up Information    Follow up with Jacqueline Blamer, MD .         Activity: Fall precautions use walker/cane & assistance as needed  Diet: Diabetic, Aspiration precautions.  For Heart failure patients - Check your Weight same time everyday, if you gain over 2 pounds, or you develop in leg swelling, experience more shortness of breath or chest pain, call your Primary MD immediately. Follow Cardiac Low Salt Diet and 1.8 lit/day fluid restriction.  Disposition Home  If you experience worsening of your admission symptoms, develop shortness of breath, life threatening emergency, suicidal or homicidal thoughts you must seek medical attention immediately by calling 911 or calling your MD immediately  if symptoms less severe.  You Must read complete instructions/literature along with all the possible adverse reactions/side effects for all the Medicines you take and that have been prescribed to you. Take any new Medicines after you have completely understood and accpet all the possible adverse reactions/side effects.   Do not drive if your were admitted for syncope or siezures until you have seen by Primary MD or a Neurologist and advised to drive.  Do not drive when taking Pain medications.    Do not take more than prescribed Pain, Sleep and Anxiety Medications  Special Instructions: If you have smoked or chewed Tobacco  in the last 2 yrs please stop smoking, stop any regular Alcohol  and or any Recreational drug  use.  Wear Seat belts while driving.

## 2011-08-11 NOTE — Progress Notes (Signed)
Pt discharged to home per MD order. Pt reviewed and received all discharge instructions and medication information including prescriptions and follow-up appointment information.  Pt sent home with albuterol inhaler as well.  Pt escorted to exit via wheelchair with no complaints of shortness of breath.  [name]

## 2011-09-25 ENCOUNTER — Emergency Department (INDEPENDENT_AMBULATORY_CARE_PROVIDER_SITE_OTHER)
Admission: EM | Admit: 2011-09-25 | Discharge: 2011-09-25 | Disposition: A | Discharge status: Home / Self Care | Payer: PRIVATE HEALTH INSURANCE | Source: Home / Self Care | Attending: Emergency Medicine | Admitting: Emergency Medicine

## 2011-09-25 ENCOUNTER — Encounter (HOSPITAL_COMMUNITY): Payer: Self-pay | Admitting: Emergency Medicine

## 2011-09-25 DIAGNOSIS — J45901 Unspecified asthma with (acute) exacerbation: Secondary | ICD-10-CM

## 2011-09-25 MED ORDER — IPRATROPIUM BROMIDE 0.02 % IN SOLN
0.5000 mg | Freq: Once | RESPIRATORY_TRACT | Status: AC
  Administered 2011-09-25: 0.5 mg via RESPIRATORY_TRACT

## 2011-09-25 MED ORDER — PREDNISONE 20 MG PO TABS: 40.0000 mg | ORAL_TABLET | Freq: Every day | ORAL | Status: AC

## 2011-09-25 MED ORDER — ALBUTEROL SULFATE (5 MG/ML) 0.5% IN NEBU
INHALATION_SOLUTION | RESPIRATORY_TRACT | Status: AC
  Filled 2011-09-25: qty 1

## 2011-09-25 MED ORDER — PREDNISONE 20 MG PO TABS
ORAL_TABLET | ORAL | Status: AC
  Filled 2011-09-25: qty 1

## 2011-09-25 MED ORDER — ALBUTEROL SULFATE (5 MG/ML) 0.5% IN NEBU
5.0000 mg | INHALATION_SOLUTION | Freq: Once | RESPIRATORY_TRACT | Status: AC
  Administered 2011-09-25: 5 mg via RESPIRATORY_TRACT

## 2011-09-25 MED ORDER — PREDNISONE 20 MG PO TABS
20.0000 mg | ORAL_TABLET | Freq: Once | ORAL | Status: AC
  Administered 2011-09-25: 20 mg via ORAL

## 2011-09-25 MED ORDER — FEXOFENADINE-PSEUDOEPHED ER 60-120 MG PO TB12: 1.0000 | ORAL_TABLET | Freq: Two times a day (BID) | ORAL | Status: DC

## 2011-09-25 NOTE — ED Notes (Signed)
PT HERE WITH SX SORE THROAT,H/A AND MINMAL COUGH THAT STARTED X 3 DYS AGO UNRELIEVED BY OTC MEDS AND ALBUTEROL INHALER BUT HAS TRIGGERED ASTHMA YESTERDAY WITH SOB,CHEST TIGHTNESS AND YELLOW PHLEGM.AUDIBLE WHEEZING.LAST USED INHALER X 3 HRS AGO

## 2011-09-25 NOTE — Discharge Instructions (Signed)

## 2011-09-25 NOTE — ED Notes (Signed)
Patient called reporting allegra d too expensive.  Patient says not available in script.  Spoke to dr Ladon Applebaum.  Dr Ladon Applebaum recommended claritin 10mg  1 tab po daily x 2 weeks.  Instructed pattient, patient agreeable.

## 2011-09-25 NOTE — ED Provider Notes (Signed)
History     CSN: [id number]  Arrival date & time 09/25/11  7829   First MD Initiated Contact with Patient 09/25/11 1009      Chief Complaint  Patient presents with  . Asthma  . URI    (Consider location/radiation/quality/duration/timing/severity/associated sxs/prior treatment) HPI Comments: Patient presents urgent care with ongoing nasal congestion no, sore throat minimal cough and a runny congested nose. She was tried some over-the-counter decongestants (uncertain of name) and using some leftover albuterol that she was short of breath and wheezing yesterday. She's also been coughing and has yellow phlegm with her cough.  Patient denies any abdominal pain, vomiting  Patient is a 61 y.o. female presenting with asthma and URI.  Asthma This is a new problem. The current episode started more than 2 days ago. The problem occurs constantly. The problem has been gradually worsening. Associated symptoms include shortness of breath. The symptoms are aggravated by walking. The symptoms are relieved by nothing. Treatments tried: albuterol.  URI The primary symptoms include sore throat, cough, wheezing and rash. Primary symptoms do not include fever, fatigue or ear pain.  The patient's medical history is significant for asthma.  Symptoms associated with the illness include sinus pressure, congestion and rhinorrhea. The illness is not associated with chills.    Past Medical History  Diagnosis Date  . Asthma   . Diabetes mellitus   . Hypertension   . Dyslipidemia     Past Surgical History  Procedure Date  . Abdominal hysterectomy   . Hernia repair   . Knee arthroscopy     Family History  Problem Relation Age of Onset  . Diabetes type II    . Hypertension      History  Substance Use Topics  . Smoking status: Never Smoker   . Smokeless tobacco: Not on file  . Alcohol Use: No    OB History    Grav Para Term Preterm Abortions TAB SAB Ect Mult Living                   Review of Systems  Constitutional: Positive for appetite change. Negative for fever, chills and fatigue.  HENT: Positive for congestion, sore throat, rhinorrhea, postnasal drip and sinus pressure. Negative for ear pain, neck pain and neck stiffness.   Respiratory: Positive for cough, shortness of breath and wheezing.   Skin: Positive for rash. Negative for color change.    Allergies  Amlodipine besylate; Lisinopril; and Pantoprazole sodium  Home Medications   Current Outpatient Rx  Name Route Sig Dispense Refill  . ALBUTEROL SULFATE HFA 108 (90 BASE) MCG/ACT IN AERS Inhalation Inhale 2 puffs into the lungs every 4 (four) hours as needed. For shortness of breath 1 Inhaler 1  . ALBUTEROL SULFATE (2.5 MG/3ML) 0.083% IN NEBU Nebulization Take 2.5 mg by nebulization every 4 (four) hours as needed. For shortness of breath    . ALPRAZOLAM 0.5 MG PO TABS Oral Take 0.5 mg by mouth 2 (two) times daily as needed. For anxiety    . ASPIRIN EC 81 MG PO TBEC Oral Take 81 mg by mouth daily.    . ATORVASTATIN CALCIUM 40 MG PO TABS Oral Take 40 mg by mouth daily.    Marland Kitchen ESOMEPRAZOLE MAGNESIUM 40 MG PO CPDR Oral Take 40 mg by mouth daily before breakfast.    . ESTRADIOL 1 MG PO TABS Oral Take 1 mg by mouth daily.    Marland Kitchen FEXOFENADINE-PSEUDOEPHED ER 60-120 MG PO TB12 Oral Take 1 tablet  by mouth every 12 (twelve) hours. 30 tablet 0  . METFORMIN HCL 1000 MG PO TABS Oral Take 1,000 mg by mouth 2 (two) times daily with a meal.    . POTASSIUM CHLORIDE CRYS ER 20 MEQ PO TBCR Oral Take 20 mEq by mouth daily.    Marland Kitchen PREDNISONE 20 MG PO TABS Oral Take 2 tablets (40 mg total) by mouth daily. 10 tablet 0  . PREDNISONE 5 MG PO TABS  Label  & dispense according to the schedule below. 10 Pills PO for 3 days then, 8 Pills PO for 3 days, 6 Pills PO for 3 days, 4 Pills PO for 3 days, 2 Pills PO for 3 days, 1 Pills PO for 3 days, 1/2 Pill  PO for 3 days then STOP. 100 tablet 0    Dispense as written.  Marland Kitchen  VALSARTAN-HYDROCHLOROTHIAZIDE 320-25 MG PO TABS Oral Take 1 tablet by mouth daily.      BP 169/75  Pulse 71  Temp(Src) 98 F (36.7 C) (Oral)  Resp 18  SpO2 100%  Physical Exam  Nursing note and vitals reviewed. Constitutional: She appears well-developed and well-nourished.  Non-toxic appearance. She does not have a sickly appearance. She does not appear ill. No distress.  HENT:  Head: Normocephalic.  Right Ear: Tympanic membrane normal.  Left Ear: Tympanic membrane normal.  Nose: Rhinorrhea present.  Mouth/Throat: Uvula is midline and mucous membranes are normal. Posterior oropharyngeal erythema present. No oropharyngeal exudate or posterior oropharyngeal edema.  Eyes: Conjunctivae are normal.  Neck: Neck supple. No JVD present.  Pulmonary/Chest: Effort normal. No respiratory distress. She has no decreased breath sounds. She has wheezes. She has no rhonchi. She has no rales. She exhibits no tenderness.  Lymphadenopathy:    She has no cervical adenopathy.  Skin: No rash noted.    ED Course  Procedures (including critical care time)  Labs Reviewed - No data to display No results found.   1. Asthma exacerbation       MDM  Patient with upper respiratory symptoms and reactive disease for 3 days. Improved exam as nebulizer treatment.      Jimmie Molly, MD 09/25/11 1259

## 2012-03-09 ENCOUNTER — Emergency Department (HOSPITAL_COMMUNITY): Payer: PRIVATE HEALTH INSURANCE

## 2012-03-09 ENCOUNTER — Inpatient Hospital Stay (HOSPITAL_COMMUNITY)
Admission: EM | Admit: 2012-03-09 | Discharge: 2012-03-11 | DRG: 203 | Disposition: A | Discharge status: Home / Self Care | Payer: PRIVATE HEALTH INSURANCE | Attending: Internal Medicine | Admitting: Internal Medicine

## 2012-03-09 ENCOUNTER — Encounter (HOSPITAL_COMMUNITY): Payer: Self-pay | Admitting: Family Medicine

## 2012-03-09 DIAGNOSIS — J309 Allergic rhinitis, unspecified: Secondary | ICD-10-CM

## 2012-03-09 DIAGNOSIS — Z86718 Personal history of other venous thrombosis and embolism: Secondary | ICD-10-CM

## 2012-03-09 DIAGNOSIS — J383 Other diseases of vocal cords: Secondary | ICD-10-CM

## 2012-03-09 DIAGNOSIS — Z23 Encounter for immunization: Secondary | ICD-10-CM

## 2012-03-09 DIAGNOSIS — K219 Gastro-esophageal reflux disease without esophagitis: Secondary | ICD-10-CM

## 2012-03-09 DIAGNOSIS — E1165 Type 2 diabetes mellitus with hyperglycemia: Secondary | ICD-10-CM | POA: Diagnosis present

## 2012-03-09 DIAGNOSIS — E1142 Type 2 diabetes mellitus with diabetic polyneuropathy: Secondary | ICD-10-CM | POA: Diagnosis present

## 2012-03-09 DIAGNOSIS — E1149 Type 2 diabetes mellitus with other diabetic neurological complication: Secondary | ICD-10-CM

## 2012-03-09 DIAGNOSIS — E876 Hypokalemia: Secondary | ICD-10-CM | POA: Diagnosis present

## 2012-03-09 DIAGNOSIS — J45909 Unspecified asthma, uncomplicated: Secondary | ICD-10-CM

## 2012-03-09 DIAGNOSIS — J329 Chronic sinusitis, unspecified: Secondary | ICD-10-CM

## 2012-03-09 DIAGNOSIS — J45901 Unspecified asthma with (acute) exacerbation: Principal | ICD-10-CM

## 2012-03-09 DIAGNOSIS — E7800 Pure hypercholesterolemia, unspecified: Secondary | ICD-10-CM

## 2012-03-09 DIAGNOSIS — R0602 Shortness of breath: Secondary | ICD-10-CM

## 2012-03-09 DIAGNOSIS — E785 Hyperlipidemia, unspecified: Secondary | ICD-10-CM | POA: Diagnosis present

## 2012-03-09 DIAGNOSIS — Z7982 Long term (current) use of aspirin: Secondary | ICD-10-CM

## 2012-03-09 DIAGNOSIS — E119 Type 2 diabetes mellitus without complications: Secondary | ICD-10-CM

## 2012-03-09 DIAGNOSIS — I10 Essential (primary) hypertension: Secondary | ICD-10-CM

## 2012-03-09 DIAGNOSIS — J441 Chronic obstructive pulmonary disease with (acute) exacerbation: Secondary | ICD-10-CM

## 2012-03-09 DIAGNOSIS — F3289 Other specified depressive episodes: Secondary | ICD-10-CM

## 2012-03-09 DIAGNOSIS — IMO0002 Reserved for concepts with insufficient information to code with codable children: Secondary | ICD-10-CM | POA: Diagnosis present

## 2012-03-09 DIAGNOSIS — F329 Major depressive disorder, single episode, unspecified: Secondary | ICD-10-CM

## 2012-03-09 DIAGNOSIS — Z79899 Other long term (current) drug therapy: Secondary | ICD-10-CM

## 2012-03-09 HISTORY — DX: Bronchitis, not specified as acute or chronic: J40

## 2012-03-09 HISTORY — DX: Pneumonia, unspecified organism: J18.9

## 2012-03-09 LAB — CBC WITH DIFFERENTIAL/PLATELET
Basophils Relative: 1 % (ref 0–1)
Eosinophils Absolute: 0.2 10*3/uL (ref 0.0–0.7)
Eosinophils Relative: 2 % (ref 0–5)
Hemoglobin: 12.5 g/dL (ref 12.0–15.0)
Lymphs Abs: 2.3 10*3/uL (ref 0.7–4.0)
MCH: 30.6 pg (ref 26.0–34.0)
MCHC: 33.7 g/dL (ref 30.0–36.0)
MCV: 90.7 fL (ref 78.0–100.0)
Monocytes Relative: 5 % (ref 3–12)
RBC: 4.09 MIL/uL (ref 3.87–5.11)

## 2012-03-09 LAB — BASIC METABOLIC PANEL
BUN: 20 mg/dL (ref 6–23)
Calcium: 9.6 mg/dL (ref 8.4–10.5)
Creatinine, Ser: 0.88 mg/dL (ref 0.50–1.10)
GFR calc non Af Amer: 68 mL/min — ABNORMAL LOW (ref >90)
Glucose, Bld: 143 mg/dL — ABNORMAL HIGH (ref 70–99)

## 2012-03-09 LAB — GLUCOSE, CAPILLARY

## 2012-03-09 LAB — CBC
HCT: 42.3 % (ref 36.0–46.0)
Hemoglobin: 15 g/dL (ref 12.0–15.0)
MCHC: 35.5 g/dL (ref 30.0–36.0)
RBC: 4.37 MIL/uL (ref 3.87–5.11)
WBC: 10.2 10*3/uL (ref 4.0–10.5)

## 2012-03-09 MED ORDER — INFLUENZA VIRUS VACC SPLIT PF IM SUSP
0.5000 mL | INTRAMUSCULAR | Status: AC
  Administered 2012-03-10: 0.5 mL via INTRAMUSCULAR
  Filled 2012-03-09: qty 0.5

## 2012-03-09 MED ORDER — METHYLPREDNISOLONE SODIUM SUCC 125 MG IJ SOLR
80.0000 mg | Freq: Three times a day (TID) | INTRAMUSCULAR | Status: DC
  Administered 2012-03-09 – 2012-03-10 (×3): 80 mg via INTRAVENOUS
  Filled 2012-03-09 (×5): qty 1.28

## 2012-03-09 MED ORDER — HYDROCHLOROTHIAZIDE 25 MG PO TABS
25.0000 mg | ORAL_TABLET | Freq: Every day | ORAL | Status: DC
  Administered 2012-03-09 – 2012-03-11 (×3): 25 mg via ORAL
  Filled 2012-03-09 (×3): qty 1

## 2012-03-09 MED ORDER — ESTRADIOL 1 MG PO TABS
1.0000 mg | ORAL_TABLET | Freq: Every day | ORAL | Status: DC
  Administered 2012-03-09 – 2012-03-11 (×3): 1 mg via ORAL
  Filled 2012-03-09 (×3): qty 1

## 2012-03-09 MED ORDER — MOXIFLOXACIN HCL IN NACL 400 MG/250ML IV SOLN
400.0000 mg | INTRAVENOUS | Status: DC
  Administered 2012-03-09 – 2012-03-10 (×2): 400 mg via INTRAVENOUS
  Filled 2012-03-09 (×3): qty 250

## 2012-03-09 MED ORDER — INSULIN ASPART 100 UNIT/ML ~~LOC~~ SOLN
0.0000 [IU] | Freq: Three times a day (TID) | SUBCUTANEOUS | Status: DC
  Administered 2012-03-09: 5 [IU] via SUBCUTANEOUS
  Administered 2012-03-10: 3 [IU] via SUBCUTANEOUS
  Administered 2012-03-10: 7 [IU] via SUBCUTANEOUS
  Administered 2012-03-10 – 2012-03-11 (×2): 2 [IU] via SUBCUTANEOUS

## 2012-03-09 MED ORDER — SODIUM CHLORIDE 0.9 % IV SOLN
INTRAVENOUS | Status: DC
  Administered 2012-03-09: 15:00:00 via INTRAVENOUS

## 2012-03-09 MED ORDER — METHYLPREDNISOLONE SODIUM SUCC 125 MG IJ SOLR
125.0000 mg | Freq: Once | INTRAMUSCULAR | Status: AC
  Administered 2012-03-09: 125 mg via INTRAVENOUS
  Filled 2012-03-09: qty 2

## 2012-03-09 MED ORDER — ALBUTEROL SULFATE (5 MG/ML) 0.5% IN NEBU
5.0000 mg | INHALATION_SOLUTION | Freq: Once | RESPIRATORY_TRACT | Status: AC
  Administered 2012-03-09: 5 mg via RESPIRATORY_TRACT
  Filled 2012-03-09: qty 1

## 2012-03-09 MED ORDER — ACETAMINOPHEN 325 MG PO TABS
650.0000 mg | ORAL_TABLET | Freq: Four times a day (QID) | ORAL | Status: DC | PRN
  Administered 2012-03-09 – 2012-03-10 (×3): 650 mg via ORAL
  Filled 2012-03-09 (×2): qty 2

## 2012-03-09 MED ORDER — GUAIFENESIN-DM 100-10 MG/5ML PO SYRP
5.0000 mL | ORAL_SOLUTION | ORAL | Status: DC | PRN
  Administered 2012-03-09 – 2012-03-10 (×2): 5 mL via ORAL
  Filled 2012-03-09 (×3): qty 5

## 2012-03-09 MED ORDER — IRBESARTAN 300 MG PO TABS
300.0000 mg | ORAL_TABLET | Freq: Every day | ORAL | Status: DC
  Administered 2012-03-09 – 2012-03-11 (×3): 300 mg via ORAL
  Filled 2012-03-09 (×3): qty 1

## 2012-03-09 MED ORDER — BUDESONIDE 0.25 MG/2ML IN SUSP
0.2500 mg | Freq: Two times a day (BID) | RESPIRATORY_TRACT | Status: DC
  Administered 2012-03-09 – 2012-03-11 (×4): 0.25 mg via RESPIRATORY_TRACT
  Filled 2012-03-09 (×6): qty 2

## 2012-03-09 MED ORDER — FAMOTIDINE 40 MG PO TABS
40.0000 mg | ORAL_TABLET | Freq: Every day | ORAL | Status: DC
  Administered 2012-03-10 – 2012-03-11 (×2): 40 mg via ORAL
  Filled 2012-03-09 (×3): qty 1

## 2012-03-09 MED ORDER — ONDANSETRON HCL 4 MG PO TABS: 4.0000 mg | ORAL_TABLET | Freq: Four times a day (QID) | ORAL | Status: DC | PRN

## 2012-03-09 MED ORDER — POTASSIUM CHLORIDE CRYS ER 20 MEQ PO TBCR
40.0000 meq | EXTENDED_RELEASE_TABLET | Freq: Once | ORAL | Status: AC
  Administered 2012-03-09: 40 meq via ORAL
  Filled 2012-03-09: qty 2

## 2012-03-09 MED ORDER — IPRATROPIUM BROMIDE 0.02 % IN SOLN
0.5000 mg | RESPIRATORY_TRACT | Status: DC
  Administered 2012-03-09: 0.5 mg via RESPIRATORY_TRACT
  Filled 2012-03-09 (×2): qty 2.5

## 2012-03-09 MED ORDER — ALPRAZOLAM 0.5 MG PO TABS
0.5000 mg | ORAL_TABLET | Freq: Two times a day (BID) | ORAL | Status: DC | PRN
  Administered 2012-03-09 – 2012-03-10 (×2): 0.5 mg via ORAL
  Filled 2012-03-09 (×2): qty 1

## 2012-03-09 MED ORDER — ENOXAPARIN SODIUM 40 MG/0.4ML ~~LOC~~ SOLN
40.0000 mg | SUBCUTANEOUS | Status: DC
  Administered 2012-03-09: 40 mg via SUBCUTANEOUS
  Filled 2012-03-09 (×2): qty 0.4

## 2012-03-09 MED ORDER — ALBUTEROL SULFATE (5 MG/ML) 0.5% IN NEBU: 2.5000 mg | INHALATION_SOLUTION | RESPIRATORY_TRACT | Status: DC

## 2012-03-09 MED ORDER — POTASSIUM CHLORIDE CRYS ER 20 MEQ PO TBCR
20.0000 meq | EXTENDED_RELEASE_TABLET | Freq: Every day | ORAL | Status: DC
  Administered 2012-03-09: 20 meq via ORAL
  Filled 2012-03-09 (×2): qty 1

## 2012-03-09 MED ORDER — IPRATROPIUM BROMIDE 0.02 % IN SOLN
0.5000 mg | Freq: Once | RESPIRATORY_TRACT | Status: AC
  Administered 2012-03-09: 0.5 mg via RESPIRATORY_TRACT
  Filled 2012-03-09: qty 2.5

## 2012-03-09 MED ORDER — ASPIRIN EC 81 MG PO TBEC
81.0000 mg | DELAYED_RELEASE_TABLET | Freq: Every day | ORAL | Status: DC
  Administered 2012-03-09 – 2012-03-11 (×3): 81 mg via ORAL
  Filled 2012-03-09 (×3): qty 1

## 2012-03-09 MED ORDER — ONDANSETRON HCL 4 MG/2ML IJ SOLN: 4.0000 mg | Freq: Four times a day (QID) | INTRAMUSCULAR | Status: DC | PRN

## 2012-03-09 MED ORDER — VALSARTAN-HYDROCHLOROTHIAZIDE 320-25 MG PO TABS: 1.0000 | ORAL_TABLET | Freq: Every day | ORAL | Status: DC

## 2012-03-09 MED ORDER — ACETAMINOPHEN 650 MG RE SUPP: 650.0000 mg | Freq: Four times a day (QID) | RECTAL | Status: DC | PRN

## 2012-03-09 MED ORDER — ALUM & MAG HYDROXIDE-SIMETH 200-200-20 MG/5ML PO SUSP
30.0000 mL | Freq: Four times a day (QID) | ORAL | Status: DC | PRN
  Administered 2012-03-10 – 2012-03-11 (×3): 30 mL via ORAL
  Filled 2012-03-09 (×2): qty 30

## 2012-03-09 MED ORDER — ATORVASTATIN CALCIUM 40 MG PO TABS
40.0000 mg | ORAL_TABLET | Freq: Every day | ORAL | Status: DC
  Administered 2012-03-09 – 2012-03-10 (×2): 40 mg via ORAL
  Filled 2012-03-09 (×3): qty 1

## 2012-03-09 MED ORDER — ALBUTEROL SULFATE (5 MG/ML) 0.5% IN NEBU
2.5000 mg | INHALATION_SOLUTION | RESPIRATORY_TRACT | Status: AC
  Administered 2012-03-09 (×2): 2.5 mg via RESPIRATORY_TRACT
  Filled 2012-03-09 (×3): qty 0.5

## 2012-03-09 MED ORDER — IPRATROPIUM BROMIDE 0.02 % IN SOLN
0.5000 mg | RESPIRATORY_TRACT | Status: DC
  Administered 2012-03-09 – 2012-03-11 (×10): 0.5 mg via RESPIRATORY_TRACT
  Filled 2012-03-09 (×11): qty 2.5

## 2012-03-09 MED ORDER — POTASSIUM CHLORIDE CRYS ER 10 MEQ PO TBCR
10.0000 meq | EXTENDED_RELEASE_TABLET | Freq: Three times a day (TID) | ORAL | Status: DC
  Administered 2012-03-09: 10 meq via ORAL
  Filled 2012-03-09 (×3): qty 1

## 2012-03-09 MED ORDER — ALBUTEROL SULFATE (5 MG/ML) 0.5% IN NEBU
5.0000 mg | INHALATION_SOLUTION | RESPIRATORY_TRACT | Status: DC | PRN
  Administered 2012-03-09 (×2): 5 mg via RESPIRATORY_TRACT
  Filled 2012-03-09 (×2): qty 1

## 2012-03-09 NOTE — ED Notes (Signed)
Patient transported to X-ray 

## 2012-03-09 NOTE — H&P (Signed)
PATIENT DETAILS Name: [name] Age: 61 y.o. Sex: female Date of Birth: 22-Jan-1951 Admit Date: 03/09/2012 [id number], Chrissie Noa, MD   CHIEF COMPLAINT:  Shortness of breath  HPI: Patient is a 61 year old African American female with a past medical history of bronchial asthma, diabetes, hypertension who presented to the emergency room today for evaluation of shortness of breath. The patient she was in her usual state of health around for 5 days ago when she started developing nasal congestion. Yesterday she started developing shortness of breath and it started rapidly worsening, she claims she used the inhaler however she did not have much success. Throughout the night her symptoms worsened, and as a result she presented to the ED. She was given IV Solu-Medrol, nebulized bronchodilators with some relief. During my evaluation she claims some symptomatic improvement then on initial presentation, she is talking in full sentences and although short of breath does not appear to be in acute distress. She denies any fever. She does claim to have cough that is mostly dry. She does not have any chest pain. Mag headache. She denies any nausea vomiting or abdominal pain. She denies any diarrhea.   ALLERGIES:   Allergies  Allergen Reactions  . Amlodipine Besylate Other (See Comments)    Reaction unknown  . Lisinopril Other (See Comments)    Reaction unknown  . Pantoprazole Sodium Other (See Comments)    Reaction unknown    PAST MEDICAL HISTORY: Past Medical History  Diagnosis Date  . Asthma   . Diabetes mellitus   . Hypertension   . Dyslipidemia   . Pneumonia   . Bronchitis     PAST SURGICAL HISTORY: Past Surgical History  Procedure Date  . Abdominal hysterectomy   . Hernia repair   . Knee arthroscopy   . Hemorrhoid surgery     MEDICATIONS AT HOME: Prior to Admission medications   Medication Sig Start Date End Date Taking? Authorizing Provider  albuterol (PROVENTIL HFA;VENTOLIN  HFA) 108 (90 BASE) MCG/ACT inhaler Inhale 2 puffs into the lungs every 4 (four) hours as needed. For shortness of breath 08/11/11  Yes Leroy Sea, MD  albuterol (PROVENTIL) (2.5 MG/3ML) 0.083% nebulizer solution Take 2.5 mg by nebulization every 4 (four) hours as needed. For shortness of breath   Yes Historical Provider, MD  ALPRAZolam (XANAX) 0.5 MG tablet Take 0.5 mg by mouth 2 (two) times daily as needed. For anxiety   Yes Historical Provider, MD  aspirin EC 81 MG tablet Take 81 mg by mouth daily.   Yes Historical Provider, MD  atorvastatin (LIPITOR) 40 MG tablet Take 40 mg by mouth daily.   Yes Historical Provider, MD  esomeprazole (NEXIUM) 40 MG capsule Take 40 mg by mouth daily before breakfast.   Yes Historical Provider, MD  estradiol (ESTRACE) 1 MG tablet Take 1 mg by mouth daily.   Yes Historical Provider, MD  metFORMIN (GLUCOPHAGE) 1000 MG tablet Take 1,000 mg by mouth 2 (two) times daily with a meal.   Yes Historical Provider, MD  potassium chloride SA (K-DUR,KLOR-CON) 20 MEQ tablet Take 20 mEq by mouth daily.   Yes Historical Provider, MD  valsartan-hydrochlorothiazide (DIOVAN-HCT) 320-25 MG per tablet Take 1 tablet by mouth daily.   Yes Historical Provider, MD    FAMILY HISTORY: Family History  Problem Relation Age of Onset  . Diabetes type II    . Hypertension      SOCIAL HISTORY:  reports that she has never smoked. She does not have any smokeless  tobacco history on file. She reports that she does not drink alcohol or use illicit drugs.  REVIEW OF SYSTEMS:  Constitutional:   No  weight loss, night sweats,  Fevers, chills, fatigue.  HEENT:    No headaches, Difficulty swallowing,Tooth/dental problems,Sore throat,  No sneezing, itching, ear ache, nasal congestion, post nasal drip,   Cardio-vascular: No chest pain,  Orthopnea, PND, swelling in lower extremities, anasarca,  dizziness, palpitations  GI:  No heartburn, indigestion, abdominal pain, nausea, vomiting,  diarrhea, change in  bowel habits, loss of appetite  Resp:  No coughing up of blood.No change in color of mucus.No wheezing.No chest wall deformity  Skin:  no rash or lesions.  GU:  no dysuria, change in color of urine, no urgency or frequency.  No flank pain.  Musculoskeletal: No joint pain or swelling.  No decreased range of motion.  No back pain.  Psych: No change in mood or affect. No depression or anxiety.  No memory loss.   PHYSICAL EXAM: Blood pressure 147/76, pulse 98, temperature 98 F (36.7 C), temperature source Oral, resp. rate 18, height 5\' 2"  (1.575 m), weight 74.39 kg (164 lb), SpO2 100.00%.  General appearance :Awake, alert, not in any distress. Speech Clear. Not toxic Looking HEENT: Atraumatic and Normocephalic, pupils equally reactive to light and accomodation Neck: supple, no JVD. No cervical lymphadenopathy.  Chest:Good air entry bilaterally, rhonchi heard in all lung zones.  CVS: S1 S2 regular, no murmurs.  Abdomen: Bowel sounds present, Non tender and not distended with no gaurding, rigidity or rebound. Extremities: B/L Lower Ext shows no edema, both legs are warm to touch, with  dorsalis pedis pulses palpable. Neurology: Awake alert, and oriented X 3, CN II-XII intact, Non focal, Deep Tendon Reflex-2+ all over, plantar's downgoing B/L, sensory exam is grossly intact.  Skin:No Rash Wounds:N/A  LABS ON ADMISSION:   Basename 03/09/12 0650  NA 139  K 2.9*  CL 99  CO2 28  GLUCOSE 143*  BUN 20  CREATININE 0.88  CALCIUM 9.6  MG --  PHOS --   No results found for this basename: AST:2,ALT:2,ALKPHOS:2,BILITOT:2,PROT:2,ALBUMIN:2 in the last 72 hours No results found for this basename: LIPASE:2,AMYLASE:2 in the last 72 hours  Basename 03/09/12 1247 03/09/12 0650  WBC 10.2 8.9  NEUTROABS -- 5.9  HGB 15.0 12.5  HCT 42.3 37.1  MCV 96.8 90.7  PLT 128* 300   No results found for this basename: CKTOTAL:3,CKMB:3,CKMBINDEX:3,TROPONINI:3 in the last 72  hours No results found for this basename: DDIMER:2 in the last 72 hours No components found with this basename: POCBNP:3   RADIOLOGIC STUDIES ON ADMISSION: Dg Chest 2 View  03/09/2012  *RADIOLOGY REPORT*  Clinical Data:  Shortness of breath, chest tightness and cough.  CHEST - 2 VIEW  Comparison: 08/09/2011  Findings:  The heart size and mediastinal contours are within normal limits.  Stable mildly ectatic thoracic aorta.  Both lungs are clear.  The visualized skeletal structures are unremarkable.  IMPRESSION: No active disease.   Original Report Authenticated By: Irish Lack, M.D.     ASSESSMENT AND PLAN: Present on Admission:  . 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  -Incentive spirometry and flutter valve will be encouraged  -She'll be monitored closely, further plan will depend as we assess her clinical response on reevaluation  . Hypokalemia -? Secondary to HCTZ therapy -Will  replete and recheck in a.m.  Marland Kitchen DIABETES MELLITUS, TYPE II -Hold metformin, place on SSI while inpatient.   Marland Kitchen HYPERCHOLESTEROLEMIA -Continue with statin   . HYPERTENSION -Continue with Avapro, and HCTZ.   Loni Beckwith R D -Allergic to Protonix, will place on Pepcid  Further plan will depend as patient's clinical course evolves and further radiologic and laboratory data become available. Patient will be monitored closely.  DVT Prophylaxis: -Prophylactic Lovenox  Code Status: -Full code  Total time spent for admission equals 45 minutes.  [facility] Pager [phone]  If 7PM-7AM, please contact night-coverage [email or web address] Password [facility] 03/09/2012, 3:38 PM

## 2012-03-09 NOTE — ED Notes (Signed)
Pt keys returned to pt by security. Pt is to contact security, tell them the description of the car and it is in the valet lot.

## 2012-03-09 NOTE — ED Provider Notes (Signed)
Medical screening examination/treatment/procedure(s) were performed by non-physician practitioner and as supervising physician I was immediately available for consultation/collaboration.  Tobin Chad, MD 03/09/12 [phone]

## 2012-03-09 NOTE — ED Provider Notes (Signed)
History     CSN: [id number]  Arrival date & time 03/09/12  5784   First MD Initiated Contact with Patient 03/09/12 [phone]      Chief Complaint  Patient presents with  . Shortness of Breath    (Consider location/radiation/quality/duration/timing/severity/associated sxs/prior treatment) HPI Comments: 61 year old female with a history of asthma presents emergency department complaining of shortness of breath for the past few days worsening last night. She tried using her inhalers and nebulizer without any relief. She got about 2 hours of sleep, and woke up with shortness of breath worsening. Admits to associated productive cough and chest tightness. She is unsure of the color of the phlegm. Denies fever, chills, nausea or vomiting chest pain. She has had a few hospitalizations for her asthma, however she has only been tubed once "years ago". This feels similar to her normal asthma exacerbations, but states it is "a bad one".  Patient is a 61 y.o. female presenting with shortness of breath. The history is provided by the patient.  Shortness of Breath  Associated symptoms include cough and shortness of breath. Pertinent negatives include no chest pain and no fever.    Past Medical History  Diagnosis Date  . Asthma   . Diabetes mellitus   . Hypertension   . Dyslipidemia     Past Surgical History  Procedure Date  . Abdominal hysterectomy   . Hernia repair   . Knee arthroscopy     Family History  Problem Relation Age of Onset  . Diabetes type II    . Hypertension      History  Substance Use Topics  . Smoking status: Never Smoker   . Smokeless tobacco: Not on file  . Alcohol Use: No    OB History    Grav Para Term Preterm Abortions TAB SAB Ect Mult Living                  Review of Systems  Constitutional: Negative for fever and chills.  HENT: Negative for neck pain.   Respiratory: Positive for cough, chest tightness and shortness of breath.   Cardiovascular: Negative  for chest pain.  Gastrointestinal: Negative for nausea and vomiting.  Genitourinary: Negative.   Musculoskeletal: Negative for back pain.  Skin: Negative.   Neurological: Positive for light-headedness.  Psychiatric/Behavioral: Negative for confusion.    Allergies  Amlodipine besylate; Lisinopril; and Pantoprazole sodium  Home Medications   Current Outpatient Rx  Name  Route  Sig  Dispense  Refill  . ALBUTEROL SULFATE HFA 108 (90 BASE) MCG/ACT IN AERS   Inhalation   Inhale 2 puffs into the lungs every 4 (four) hours as needed. For shortness of breath   1 Inhaler   1   . ALBUTEROL SULFATE (2.5 MG/3ML) 0.083% IN NEBU   Nebulization   Take 2.5 mg by nebulization every 4 (four) hours as needed. For shortness of breath         . ALPRAZOLAM 0.5 MG PO TABS   Oral   Take 0.5 mg by mouth 2 (two) times daily as needed. For anxiety         . ASPIRIN EC 81 MG PO TBEC   Oral   Take 81 mg by mouth daily.         . ATORVASTATIN CALCIUM 40 MG PO TABS   Oral   Take 40 mg by mouth daily.         Marland Kitchen ESOMEPRAZOLE MAGNESIUM 40 MG PO CPDR   Oral  Take 40 mg by mouth daily before breakfast.         . ESTRADIOL 1 MG PO TABS   Oral   Take 1 mg by mouth daily.         Marland Kitchen METFORMIN HCL 1000 MG PO TABS   Oral   Take 1,000 mg by mouth 2 (two) times daily with a meal.         . POTASSIUM CHLORIDE CRYS ER 20 MEQ PO TBCR   Oral   Take 20 mEq by mouth daily.         Marland Kitchen VALSARTAN-HYDROCHLOROTHIAZIDE 320-25 MG PO TABS   Oral   Take 1 tablet by mouth daily.           BP 184/77  Pulse 89  Temp 98.2 F (36.8 C) (Oral)  Resp 24  Ht 5\' 2"  (1.575 m)  Wt 164 lb (74.39 kg)  BMI 30.00 kg/m2  SpO2 100%  Physical Exam  Constitutional: She is oriented to person, place, and time. She appears well-developed and well-nourished. No distress.       Labored breathing  HENT:  Head: Normocephalic and atraumatic.  Mouth/Throat: Oropharynx is clear and moist.  Eyes: Conjunctivae  normal and EOM are normal. Pupils are equal, round, and reactive to light.  Neck: Normal range of motion. Neck supple.  Cardiovascular: Normal rate, regular rhythm and normal heart sounds.   Pulmonary/Chest: Accessory muscle usage present. Tachypnea noted. She has wheezes (scattered).  Abdominal: Soft. Bowel sounds are normal. There is no tenderness.  Musculoskeletal: Normal range of motion. She exhibits no edema.  Neurological: She is alert and oriented to person, place, and time.  Skin: Skin is warm and dry.  Psychiatric: She has a normal mood and affect. Her behavior is normal.    ED Course  Procedures (including critical care time)   Labs Reviewed  CBC WITH DIFFERENTIAL  BASIC METABOLIC PANEL   Dg Chest 2 View  03/09/2012  *RADIOLOGY REPORT*  Clinical Data:  Shortness of breath, chest tightness and cough.  CHEST - 2 VIEW  Comparison: 08/09/2011  Findings:  The heart size and mediastinal contours are within normal limits.  Stable mildly ectatic thoracic aorta.  Both lungs are clear.  The visualized skeletal structures are unremarkable.  IMPRESSION: No active disease.   Original Report Authenticated By: Irish Lack, M.D.      1. Asthma exacerbation       MDM  61 y/o with asthma exacerbation. Wheezing improved after steroids and duo-nebs, however patient is still with labored breathing and sob. She does not feel any better. Patient admitted.        Trevor Mace, PA-C 03/09/12 [phone]

## 2012-03-09 NOTE — ED Notes (Signed)
Pts. Car keys given to Diplomatic Services operational officer. Pt. States car parked in ED entrance and needed to be moved.

## 2012-03-09 NOTE — ED Notes (Signed)
Pt presents with SOB and "tight" CP. Pt receiving neb treatment.  Pt alert and interactive.  Lungs sounds diminished and tight bilaterally with expiratory wheeze on RLL.

## 2012-03-09 NOTE — ED Notes (Signed)
ED PA at bedside

## 2012-03-09 NOTE — ED Notes (Signed)
Patient severely short of breath; reports history of asthma.  Patient pulled back to exam room; albuterol/atrovent treatment started per protocol.  Damon, primary RN given report and notified of patient's status.

## 2012-03-10 ENCOUNTER — Inpatient Hospital Stay (HOSPITAL_COMMUNITY): Payer: PRIVATE HEALTH INSURANCE

## 2012-03-10 DIAGNOSIS — J441 Chronic obstructive pulmonary disease with (acute) exacerbation: Secondary | ICD-10-CM

## 2012-03-10 DIAGNOSIS — J45901 Unspecified asthma with (acute) exacerbation: Secondary | ICD-10-CM

## 2012-03-10 DIAGNOSIS — F3289 Other specified depressive episodes: Secondary | ICD-10-CM

## 2012-03-10 DIAGNOSIS — F329 Major depressive disorder, single episode, unspecified: Secondary | ICD-10-CM

## 2012-03-10 LAB — URINALYSIS, ROUTINE W REFLEX MICROSCOPIC
Glucose, UA: 250 mg/dL — AB
Leukocytes, UA: NEGATIVE
Nitrite: NEGATIVE
Protein, ur: NEGATIVE mg/dL
pH: 6 (ref 5.0–8.0)

## 2012-03-10 LAB — BASIC METABOLIC PANEL
BUN: 23 mg/dL (ref 6–23)
CO2: 25 mEq/L (ref 19–32)
Chloride: 102 mEq/L (ref 96–112)
Glucose, Bld: 221 mg/dL — ABNORMAL HIGH (ref 70–99)
Potassium: 3.1 mEq/L — ABNORMAL LOW (ref 3.5–5.1)

## 2012-03-10 LAB — GLUCOSE, CAPILLARY
Glucose-Capillary: 215 mg/dL — ABNORMAL HIGH (ref 70–99)
Glucose-Capillary: 345 mg/dL — ABNORMAL HIGH (ref 70–99)

## 2012-03-10 LAB — CBC
HCT: 35.6 % — ABNORMAL LOW (ref 36.0–46.0)
Hemoglobin: 12.1 g/dL (ref 12.0–15.0)
RBC: 3.98 MIL/uL (ref 3.87–5.11)
WBC: 17.7 10*3/uL — ABNORMAL HIGH (ref 4.0–10.5)

## 2012-03-10 MED ORDER — METHYLPREDNISOLONE SODIUM SUCC 125 MG IJ SOLR
60.0000 mg | Freq: Two times a day (BID) | INTRAMUSCULAR | Status: DC
  Administered 2012-03-10 – 2012-03-11 (×2): 60 mg via INTRAVENOUS
  Filled 2012-03-10 (×4): qty 0.96

## 2012-03-10 MED ORDER — POTASSIUM CHLORIDE CRYS ER 20 MEQ PO TBCR
40.0000 meq | EXTENDED_RELEASE_TABLET | Freq: Four times a day (QID) | ORAL | Status: AC
  Administered 2012-03-10 (×2): 40 meq via ORAL
  Filled 2012-03-10 (×2): qty 2

## 2012-03-10 MED ORDER — FUROSEMIDE 10 MG/ML IJ SOLN
20.0000 mg | Freq: Once | INTRAMUSCULAR | Status: AC
  Administered 2012-03-10: 20 mg via INTRAVENOUS
  Filled 2012-03-10: qty 2

## 2012-03-10 NOTE — Progress Notes (Signed)
[facility]                                                                                Patient Demographics  [name], is a 61 y.o. female  [id number]  [id number]  DOB - 05/25/1950  Admit date - 03/09/2012  Admitting Physician Dewayne Shorter Levora Dredge, MD  Outpatient Primary MD for the patient is [name], MD  LOS - 1   Chief Complaint  Patient presents with  . Shortness of Breath        Assessment & Plan    . Acute exacerbation of chronic obstructive airways disease with asthma  Improved, will reduce IV Solu-Medrol, continue nebulized bronchodilators and empiric Avelox.  -Incentive spirometry and flutter valve will be encouraged , added chest PT Chest x-ray stable.    . Hypokalemia   -? Secondary to HCTZ therapy  -Will replete and recheck in a.m along with magnesium levels.     Marland Kitchen DIABETES MELLITUS, TYPE II  -Hold metformin, place on SSI while inpatient.   CBG (last 3)   Basename 03/10/12 0658 03/09/12 2241 03/09/12 1652  GLUCAP 215* 329* 284*       . HYPERCHOLESTEROLEMIA  -Continue with statin     . HYPERTENSION  -Continue with Avapro, and HCTZ.     Reece Agar E R D  -Allergic to Protonix, will place on Pepcid    Leukocytosis   secondary to steroids patient remains afebrile.    Code Status: Full  Family Communication: Discussed with the patient  Disposition Plan: Home    Procedures none   Consults  none   Time Spent in minutes   35   Antibiotics     Anti-infectives     Start     Dose/Rate Route Frequency Ordered Stop   03/09/12 1430   moxifloxacin (AVELOX) IVPB 400 mg        400 mg 250 mL/hr over 60 Minutes Intravenous Every 24 hours 03/09/12 1250            Scheduled Meds:   . albuterol  2.5 mg Nebulization Q4H  . aspirin EC  81 mg Oral Daily  . atorvastatin  40 mg Oral q1800  . budesonide  0.25 mg Nebulization BID  . enoxaparin (LOVENOX) injection  40 mg Subcutaneous Q24H  .  estradiol  1 mg Oral Daily  . famotidine  40 mg Oral Daily  . hydrochlorothiazide  25 mg Oral Daily  . influenza  inactive virus vaccine  0.5 mL Intramuscular Tomorrow-1000  . insulin aspart  0-9 Units Subcutaneous TID WC  . ipratropium  0.5 mg Nebulization Q4H  . irbesartan  300 mg Oral Daily  . methylPREDNISolone (SOLU-MEDROL) injection  60 mg Intravenous Q12H  . moxifloxacin  400 mg Intravenous Q24H  . potassium chloride  40 mEq Oral Q6H  . [DISCONTINUED] albuterol  2.5 mg Nebulization Q4H  . [DISCONTINUED] ipratropium  0.5 mg Nebulization Q4H  . [DISCONTINUED] methylPREDNISolone (SOLU-MEDROL) injection  80 mg Intravenous TID  . [DISCONTINUED] potassium chloride  10 mEq Oral TID  . [DISCONTINUED] potassium chloride SA  20 mEq Oral Daily  . [DISCONTINUED] valsartan-hydrochlorothiazide  1 tablet Oral Daily  Continuous Infusions:   . sodium chloride 10 mL/hr (03/09/12 1553)   PRN Meds:.acetaminophen, acetaminophen, ALPRAZolam, alum & mag hydroxide-simeth, guaiFENesin-dextromethorphan, ondansetron (ZOFRAN) IV, ondansetron, [DISCONTINUED] albuterol   DVT Prophylaxis  Lovenox    Lab Results  Component Value Date   PLT 327 03/10/2012      Susa Raring K M.D on 03/10/2012 at 10:12 AM  Between 7am to 7pm - Pager - [phone]  After 7pm go to [email or web address] - password [email or web address]  And look for the night coverage person covering for me after hours  [facility] Office  [phone]    Subjective:   [name] today has, No headache, No chest pain, No abdominal pain - No Nausea, No new weakness tingling or numbness, improved Cough - SOB.  Objective:   Filed Vitals:   03/09/12 1526 03/09/12 2057 03/09/12 2229 03/10/12 0356  BP:   169/75   Pulse:  95 106   Temp:   98.1 F (36.7 C)   TempSrc:   Oral   Resp:  20 20 20   Height:      Weight:      SpO2: 100%  99%     Wt Readings from Last 3 Encounters:  03/09/12 74.39 kg (164 lb)  08/11/11 75.297 kg (166  lb)  07/22/08 72.635 kg (160 lb 2.1 oz)     Intake/Output Summary (Last 24 hours) at 03/10/12 1012 Last data filed at 03/10/12 0700  Gross per 24 hour  Intake    240 ml  Output      0 ml  Net    240 ml    Exam Awake Alert, Oriented X 3, No new F.N deficits, Normal affect [place].AT,PERRAL Supple Neck,No JVD, No cervical lymphadenopathy appriciated.  Symmetrical Chest wall movement, moderate air movement bilaterally, few expiratory wheezes and coarse breath sounds RRR,No Gallops,Rubs or new Murmurs, No Parasternal Heave +ve B.Sounds, Abd Soft, Non tender, No organomegaly appriciated, No rebound - guarding or rigidity. No Cyanosis, Clubbing or edema, No new Rash or bruise      Data Review   Micro Results No results found for this or any previous visit (from the past 240 hour(s)).  Radiology Reports Dg Chest 2 View  03/09/2012  *RADIOLOGY REPORT*  Clinical Data:  Shortness of breath, chest tightness and cough.  CHEST - 2 VIEW  Comparison: 08/09/2011  Findings:  The heart size and mediastinal contours are within normal limits.  Stable mildly ectatic thoracic aorta.  Both lungs are clear.  The visualized skeletal structures are unremarkable.  IMPRESSION: No active disease.   Original Report Authenticated By: Irish Lack, M.D.    Dg Chest Port 1 View  03/10/2012  *RADIOLOGY REPORT*  Clinical Data: Cough, shortness of breath.  History of asthma, hypertension, diabetes.  History of smoking.  PORTABLE CHEST - 1 VIEW  Comparison: 03/09/2012  Findings: Heart size is normal.  The lungs are free of focal consolidations and pleural effusions.  No pulmonary edema. Visualized osseous structures have a normal appearance.  IMPRESSION: Negative exam.   Original Report Authenticated By: Norva Pavlov, M.D.     CBC  Lab 03/10/12 0702 03/09/12 1247 03/09/12 0650  WBC 17.7* 10.2 8.9  HGB 12.1 15.0 12.5  HCT 35.6* 42.3 37.1  PLT 327 128* 300  MCV 89.4 96.8 90.7  MCH 30.4 34.3* 30.6  MCHC 34.0  35.5 33.7  RDW 13.0 12.6 13.1  LYMPHSABS -- -- 2.3  MONOABS -- -- 0.5  EOSABS -- -- 0.2  BASOSABS -- -- 0.1  BANDABS -- -- --    Chemistries   Lab 03/10/12 0702 03/09/12 0650  NA 140 139  K 3.1* 2.9*  CL 102 99  CO2 25 28  GLUCOSE 221* 143*  BUN 23 20  CREATININE 0.86 0.88  CALCIUM 9.3 9.6  MG -- --  AST -- --  ALT -- --  ALKPHOS -- --  BILITOT -- --   ------------------------------------------------------------------------------------------------------------------ estimated creatinine clearance is 63.2 ml/min (by C-G formula based on Cr of 0.86). ------------------------------------------------------------------------------------------------------------------ No results found for this basename: HGBA1C:2 in the last 72 hours ------------------------------------------------------------------------------------------------------------------ No results found for this basename: CHOL:2,HDL:2,LDLCALC:2,TRIG:2,CHOLHDL:2,LDLDIRECT:2 in the last 72 hours ------------------------------------------------------------------------------------------------------------------ No results found for this basename: TSH,T4TOTAL,FREET3,T3FREE,THYROIDAB in the last 72 hours ------------------------------------------------------------------------------------------------------------------ No results found for this basename: VITAMINB12:2,FOLATE:2,FERRITIN:2,TIBC:2,IRON:2,RETICCTPCT:2 in the last 72 hours  Coagulation profile No results found for this basename: INR:5,PROTIME:5 in the last 168 hours  No results found for this basename: DDIMER:2 in the last 72 hours  Cardiac Enzymes No results found for this basename: CK:3,CKMB:3,TROPONINI:3,MYOGLOBIN:3 in the last 168 hours ------------------------------------------------------------------------------------------------------------------ No components found with this basename: POCBNP:3

## 2012-03-11 DIAGNOSIS — J45909 Unspecified asthma, uncomplicated: Secondary | ICD-10-CM

## 2012-03-11 DIAGNOSIS — J309 Allergic rhinitis, unspecified: Secondary | ICD-10-CM

## 2012-03-11 LAB — MAGNESIUM: Magnesium: 1.7 mg/dL (ref 1.5–2.5)

## 2012-03-11 LAB — URINE CULTURE: Colony Count: NO GROWTH

## 2012-03-11 LAB — GLUCOSE, CAPILLARY
Glucose-Capillary: 187 mg/dL — ABNORMAL HIGH (ref 70–99)
Glucose-Capillary: 146 mg/dL — ABNORMAL HIGH (ref 70–99)

## 2012-03-11 MED ORDER — ALUM & MAG HYDROXIDE-SIMETH 200-200-20 MG/5ML PO SUSP
30.0000 mL | Freq: Once | ORAL | Status: DC
Start: 2012-03-11 — End: 2012-03-11

## 2012-03-11 MED ORDER — MOXIFLOXACIN HCL 400 MG PO TABS: 400.0000 mg | ORAL_TABLET | Freq: Every day | ORAL | Status: DC

## 2012-03-11 MED ORDER — MAGNESIUM SULFATE IN D5W 10-5 MG/ML-% IV SOLN
1.0000 g | Freq: Once | INTRAVENOUS | Status: AC
  Administered 2012-03-11: 1 g via INTRAVENOUS
  Filled 2012-03-11: qty 100

## 2012-03-11 MED ORDER — POTASSIUM CHLORIDE CRYS ER 20 MEQ PO TBCR
40.0000 meq | EXTENDED_RELEASE_TABLET | Freq: Once | ORAL | Status: AC
  Administered 2012-03-11: 40 meq via ORAL

## 2012-03-11 MED ORDER — METHYLPREDNISOLONE 4 MG PO KIT: PACK | ORAL | Status: DC

## 2012-03-11 MED ORDER — PANTOPRAZOLE SODIUM 40 MG PO PACK
40.0000 mg | PACK | Freq: Every day | ORAL | Status: DC
  Administered 2012-03-11: 40 mg via ORAL
  Filled 2012-03-11: qty 20

## 2012-03-11 MED ORDER — FLUTICASONE PROPIONATE 50 MCG/ACT NA SUSP
2.0000 | Freq: Every day | NASAL | Status: DC
  Administered 2012-03-11: 2 via NASAL
  Filled 2012-03-11: qty 16

## 2012-03-11 MED ORDER — FLUTICASONE PROPIONATE 50 MCG/ACT NA SUSP: 2.0000 | Freq: Every day | NASAL | Status: DC

## 2012-03-11 NOTE — Progress Notes (Signed)
Utilization review completed. Shadonna Benedick, RN, BSN. 

## 2012-03-11 NOTE — Discharge Summary (Signed)
[facility]                                                                                   [name], is a 61 y.o. female  DOB 1950-10-20  MRN [id number].  Admission date:  03/09/2012  Discharge Date:  03/11/2012  Primary MD  [name], MD  Admitting Physician  Maretta Bees, MD  Admission Diagnosis  Asthma exacerbation [493.92] Asthma exacerbation  Discharge Diagnosis     Principal Problem:  *Acute exacerbation of chronic obstructive airways disease with asthma Active Problems:  DIABETES MELLITUS, TYPE II  DIABETIC PERIPHERAL NEUROPATHY  HYPERCHOLESTEROLEMIA  HYPERTENSION  G E R D    Past Medical History  Diagnosis Date  . Asthma   . Diabetes mellitus   . Hypertension   . Dyslipidemia   . Pneumonia   . Bronchitis     Past Surgical History  Procedure Date  . Abdominal hysterectomy   . Hernia repair   . Knee arthroscopy   . Hemorrhoid surgery        Discharge Diagnoses:   Principal Problem:  *Acute exacerbation of chronic obstructive airways disease with asthma Active Problems:  DIABETES MELLITUS, TYPE II  DIABETIC PERIPHERAL NEUROPATHY  HYPERCHOLESTEROLEMIA  HYPERTENSION  G E R D    Discharge Condition: Stable   Diet recommendation: See Discharge Instructions below   Consults None   History of present illness and  Hospital Course:  See H&P, Labs, Consult and Test reports for all details in brief, patient was admitted for SOB due to asthma exacerbation, treated with IV steroids and Avelox along with nebulizer Rx and PRN 02, CXR clean, mild leukocytosis which was steroid induced, now close to baseline, no chest pain or SOB, no wheezing, no o2 need, 99% on RA. Will DC on steroid taper and Avelox, please monitor her K levels closely, KDur given today.   HTN-GERD-DM-2 stable, outpt PCP follow up.    Today   Subjective:   [name] today has no headache,no chest abdominal pain,no new weakness  tingling or numbness, feels much better wants to go home today.    Objective:   Blood pressure 175/85, pulse 91, temperature 97.9 F (36.6 C), temperature source Oral, resp. rate 18, height 5\' 2"  (1.575 m), weight 74.39 kg (164 lb), SpO2 99.00%.   Intake/Output Summary (Last 24 hours) at 03/11/12 0913 Last data filed at 03/10/12 1300  Gross per 24 hour  Intake    240 ml  Output      0 ml  Net    240 ml    Exam Awake Alert, Oriented *3, No new F.N deficits, Normal affect [place].AT,PERRAL Supple Neck,No JVD, No cervical lymphadenopathy appriciated.  Symmetrical Chest wall movement, Good air movement bilaterally, CTAB RRR,No Gallops,Rubs or new Murmurs, No Parasternal Heave +ve B.Sounds, Abd Soft, Non tender, No organomegaly appriciated, No rebound -guarding or rigidity. No Cyanosis, Clubbing or edema, No new Rash or bruise  Data Review   Major procedures and Radiology Reports - PLEASE review detailed and final reports for all details in brief -       Dg Chest 2 View  03/09/2012  *  RADIOLOGY REPORT*  Clinical Data:  Shortness of breath, chest tightness and cough.  CHEST - 2 VIEW  Comparison: 08/09/2011  Findings:  The heart size and mediastinal contours are within normal limits.  Stable mildly ectatic thoracic aorta.  Both lungs are clear.  The visualized skeletal structures are unremarkable.  IMPRESSION: No active disease.   Original Report Authenticated By: Irish Lack, M.D.    Dg Chest Port 1 View  03/10/2012  *RADIOLOGY REPORT*  Clinical Data: Cough, shortness of breath.  History of asthma, hypertension, diabetes.  History of smoking.  PORTABLE CHEST - 1 VIEW  Comparison: 03/09/2012  Findings: Heart size is normal.  The lungs are free of focal consolidations and pleural effusions.  No pulmonary edema. Visualized osseous structures have a normal appearance.  IMPRESSION: Negative exam.   Original Report Authenticated By: Norva Pavlov, M.D.     Micro Results    CBC w Diff:  Lab Results  Component Value Date   WBC 17.7* 03/10/2012   HGB 12.1 03/10/2012   HCT 35.6* 03/10/2012   PLT 327 03/10/2012   LYMPHOPCT 26 03/09/2012   BANDSPCT 0 08/09/2011   MONOPCT 5 03/09/2012   EOSPCT 2 03/09/2012   BASOPCT 1 03/09/2012    CMP: Lab Results  Component Value Date   NA 140 03/10/2012   K 3.4* 03/11/2012   CL 102 03/10/2012   CO2 25 03/10/2012   BUN 23 03/10/2012   CREATININE 0.86 03/10/2012   PROT 8.5* 12/03/2010   ALBUMIN 4.1 12/03/2010   BILITOT 0.3 12/03/2010   ALKPHOS 104 12/03/2010   AST 17 12/03/2010   ALT 29 12/03/2010  .   Discharge Instructions     Follow with Primary MD [name], MD in 2 days   Get CBC, CMP, checked 2 days by Primary MD and again as instructed by your Primary MD. Get a 2 view Chest X ray done next visit.  Get Medicines reviewed and adjusted.  Please request your Prim.MD to go over all Hospital Tests and Procedure/Radiological results at the follow up, please get all Hospital records sent to your Prim MD by signing hospital release before you go home.  Activity: As tolerated with Full fall precautions use walker/cane & assistance as needed   Diet:  Heart Healthy - Low Carb  For Heart failure patients - Check your Weight same time everyday, if you gain over 2 pounds, or you develop in leg swelling, experience more shortness of breath or chest pain, call your Primary MD immediately. Follow Cardiac Low Salt Diet and 1.8 lit/day fluid restriction.  Disposition Home   If you experience worsening of your admission symptoms, develop shortness of breath, life threatening emergency, suicidal or homicidal thoughts you must seek medical attention immediately by calling 911 or calling your MD immediately  if symptoms less severe.  You Must read complete instructions/literature along with all the possible adverse reactions/side effects for all the Medicines you take and that have been prescribed to you. Take any new Medicines after you have  completely understood and accpet all the possible adverse reactions/side effects.   Do not drive and provide baby sitting services if your were admitted for syncope or siezures until you have seen by Primary MD or a Neurologist and advised to do so again.  Do not drive when taking Pain medications.    Do not take more than prescribed Pain, Sleep and Anxiety Medications  Special Instructions: If you have smoked or chewed Tobacco  in the last 2  yrs please stop smoking, stop any regular Alcohol  and or any Recreational drug use.  Wear Seat belts while driving.  [name] was admitted to the Hospital on 03/09/2012 and Discharged on Discharge Date 03/11/2012 and should be excused from work/school   for 4  days starting 03/09/2012 , may return to work/school without any restrictions.  Call Susa Raring MD, [facility] [phone] with questions.  Leroy Sea M.D on 03/11/2012,at 9:07 AM  [facility] Office  [phone]  Follow-up Information    Follow up with [name], MD. Schedule an appointment as soon as possible for a visit in 2 days.   Contact information:   [facility] [address] [place] [address] [phone]            Discharge Medications     Medication List     As of 03/11/2012  9:13 AM    START taking these medications         fluticasone 50 MCG/ACT nasal spray   Commonly known as: FLONASE   Place 2 sprays into the nose daily.      methylPREDNISolone 4 MG tablet   Commonly known as: MEDROL DOSEPAK   follow package directions      moxifloxacin 400 MG tablet   Commonly known as: AVELOX   Take 1 tablet (400 mg total) by mouth daily.      CONTINUE taking these medications         * albuterol (2.5 MG/3ML) 0.083% nebulizer solution   Commonly known as: PROVENTIL      * albuterol 108 (90 BASE) MCG/ACT inhaler   Commonly known as: PROVENTIL HFA;VENTOLIN HFA   Inhale 2 puffs into  the lungs every 4 (four) hours as needed. For shortness of breath      ALPRAZolam 0.5 MG tablet   Commonly known as: XANAX      aspirin EC 81 MG tablet      atorvastatin 40 MG tablet   Commonly known as: LIPITOR      esomeprazole 40 MG capsule   Commonly known as: NEXIUM      estradiol 1 MG tablet   Commonly known as: ESTRACE      metFORMIN 1000 MG tablet   Commonly known as: GLUCOPHAGE      potassium chloride SA 20 MEQ tablet   Commonly known as: K-DUR,KLOR-CON      valsartan-hydrochlorothiazide 320-25 MG per tablet   Commonly known as: DIOVAN-HCT     * Notice: This list has 2 medication(s) that are the same as other medications prescribed for you. Read the directions carefully, and ask your doctor or other care provider to review them with you.        Where to get your medications    These are the prescriptions that you need to pick up. We sent them to a specific pharmacy, so you will need to go there to get them.   [facility] [place], [place] - [address]    [address] [place] [address]    Phone: [phone]        fluticasone 50 MCG/ACT nasal spray   methylPREDNISolone 4 MG tablet   moxifloxacin 400 MG tablet               Total Time in preparing paper work, data evaluation and todays exam - 35 minutes  Leroy Sea M.D on 03/11/2012 at 9:13 AM  [facility] Office  [phone]    

## 2013-03-06 ENCOUNTER — Other Ambulatory Visit: Payer: Self-pay

## 2014-02-06 ENCOUNTER — Encounter (HOSPITAL_COMMUNITY): Payer: Self-pay | Admitting: Emergency Medicine

## 2014-02-06 ENCOUNTER — Emergency Department (HOSPITAL_COMMUNITY)
Admission: EM | Admit: 2014-02-06 | Discharge: 2014-02-06 | Disposition: A | Discharge status: Home / Self Care | Payer: 59 | Attending: Emergency Medicine | Admitting: Emergency Medicine

## 2014-02-06 DIAGNOSIS — Z7982 Long term (current) use of aspirin: Secondary | ICD-10-CM | POA: Insufficient documentation

## 2014-02-06 DIAGNOSIS — Z79899 Other long term (current) drug therapy: Secondary | ICD-10-CM | POA: Insufficient documentation

## 2014-02-06 DIAGNOSIS — Z8701 Personal history of pneumonia (recurrent): Secondary | ICD-10-CM | POA: Diagnosis not present

## 2014-02-06 DIAGNOSIS — E119 Type 2 diabetes mellitus without complications: Secondary | ICD-10-CM | POA: Diagnosis not present

## 2014-02-06 DIAGNOSIS — I10 Essential (primary) hypertension: Secondary | ICD-10-CM | POA: Diagnosis not present

## 2014-02-06 DIAGNOSIS — J45909 Unspecified asthma, uncomplicated: Secondary | ICD-10-CM | POA: Diagnosis present

## 2014-02-06 DIAGNOSIS — J45901 Unspecified asthma with (acute) exacerbation: Secondary | ICD-10-CM | POA: Insufficient documentation

## 2014-02-06 DIAGNOSIS — E785 Hyperlipidemia, unspecified: Secondary | ICD-10-CM | POA: Diagnosis not present

## 2014-02-06 MED ORDER — ALBUTEROL SULFATE (2.5 MG/3ML) 0.083% IN NEBU
5.0000 mg | INHALATION_SOLUTION | Freq: Once | RESPIRATORY_TRACT | Status: AC
  Administered 2014-02-06: 5 mg via RESPIRATORY_TRACT
  Filled 2014-02-06: qty 6

## 2014-02-06 MED ORDER — IPRATROPIUM BROMIDE 0.02 % IN SOLN
0.5000 mg | Freq: Once | RESPIRATORY_TRACT | Status: AC
  Administered 2014-02-06: 0.5 mg via RESPIRATORY_TRACT
  Filled 2014-02-06: qty 2.5

## 2014-02-06 MED ORDER — FLUTICASONE PROPIONATE 50 MCG/ACT NA SUSP: 2.0000 | Freq: Every day | NASAL | Status: DC

## 2014-02-06 MED ORDER — IPRATROPIUM BROMIDE HFA 17 MCG/ACT IN AERS: 2.0000 | INHALATION_SPRAY | RESPIRATORY_TRACT | Status: DC

## 2014-02-06 MED ORDER — PREDNISONE 20 MG PO TABS
60.0000 mg | ORAL_TABLET | Freq: Once | ORAL | Status: AC
  Administered 2014-02-06: 60 mg via ORAL
  Filled 2014-02-06: qty 3

## 2014-02-06 MED ORDER — PREDNISONE 20 MG PO TABS: 60.0000 mg | ORAL_TABLET | Freq: Every day | ORAL | Status: DC

## 2014-02-06 NOTE — Discharge Instructions (Signed)
Followup with your doctor in regards to your asthma exacerbation. Read the instructions below to learn more about factors that can trigger asthma. Use her albuterol inhaler as directed. Your more than welcome to return to the emergency department if you become short of breath, your albuterol inhaler did not relieve symptoms, you are having chest pain associated with difficulty breathing, or any symptoms that are concerning to you.  ° ° °IDENTIFY AND CONTROL FACTORS THAT MAKE YOUR ASTHMA WORSE °A number of common things can set off or make your asthma symptoms worse (asthma triggers). Keep track of your asthma symptoms for several weeks, detailing all the environmental and emotional factors that are linked with your asthma. When you have an asthma attack, go back to your asthma diary to see which factor, or combination of factors, might have contributed to it. Once you know what these factors are, you can take steps to control many of them.  °Allergies: If you have allergies and asthma, it is important to take asthma prevention steps at home. Asthma attacks (worsening of asthma symptoms) can be triggered by allergies, which can cause temporary increased inflammation of your airways. Minimizing contact with the substance to which you are allergic will help prevent an asthma attack. °Animal Dander:  °· Some people are allergic to the flakes of skin or dried saliva from animals with fur or feathers. Keep these pets out of your home.  °· If you can't keep a pet outdoors, keep the pet out of your bedroom and other sleeping areas at all times, and keep the door closed.  °· Remove carpets and furniture covered with cloth from your home. If that is not possible, keep the pet away from fabric-covered furniture and carpets.  °Dust Mites: °· Many people with asthma are allergic to dust mites. Dust mites are tiny bugs that are found in every home, in mattresses, pillows, carpets, fabric-covered furniture, bedcovers, clothes,  stuffed toys, fabric, and other fabric-covered items.  °· Cover your mattress in a special dust-proof cover.  °· Cover your pillow in a special dust-proof cover, or wash the pillow each week in hot water. Water must be hotter than 130° F to kill dust mites. Cold or warm water used with detergent and bleach can also be effective.  °· Wash the sheets and blankets on your bed each week in hot water.  °· Try not to sleep or lie on cloth-covered cushions.  °· Call ahead when traveling and ask for a smoke-free hotel room. Bring your own bedding and pillows, in case the hotel only supplies feather pillows and down comforters, which may contain dust mites and cause asthma symptoms.  °· Remove carpets from your bedroom and those laid on concrete, if you can.  °· Keep stuffed toys out of the bed, or wash the toys weekly in hot water or cooler water with detergent and bleach.  °Cockroaches: °· Many people with asthma are allergic to the droppings and remains of cockroaches.  °· Keep food and garbage in closed containers. Never leave food out.  °· Use poison baits, traps, powders, gels, or paste (for example, boric acid).  °· If a spray is used to kill cockroaches, stay out of the room until the odor goes away.  °Indoor Mold: °· Fix leaky faucets, pipes, or other sources of water that have mold around them.  °· Clean moldy surfaces with a cleaner that has bleach in it.  °Pollen and Outdoor Mold: °· When pollen or mold spore counts   are high, try to keep your windows closed.  °· Stay indoors with windows closed from late morning to afternoon, if you can. Pollen and some mold spore counts are highest at that time.  °· Ask your caregiver whether you need to take or increase anti-inflammatory medicine before your allergy season starts.  °Irritants:  °· Tobacco smoke is an irritant. If you smoke, ask your caregiver how you can quit. Ask family members to quit smoking, too. Do not allow smoking in your home or car.  °· If possible, do  not use a wood-burning stove, kerosene heater, or fireplace. Minimize exposure to all sources of smoke, including incense, candles, fires, and fireworks.  °· Try to stay away from strong odors and sprays, such as perfume, talcum powder, hair spray, and paints.  °· Decrease humidity in your home and use an indoor air cleaning device. Reduce indoor humidity to below 60 percent. Dehumidifiers or central air conditioners can do this.  °· Try to have someone else vacuum for you once or twice a week, if you can. Stay out of rooms while they are being vacuumed and for a short while afterward.  °· If you vacuum, use a dust mask from a hardware store, a double-layered or microfilter vacuum cleaner bag, or a vacuum cleaner with a HEPA filter.  °· Sulfites in foods and beverages can be irritants. Do not drink beer or wine, or eat dried fruit, processed potatoes, or shrimp if they cause asthma symptoms.  °· Cold air can trigger an asthma attack. Cover your nose and mouth with a scarf on cold or windy days.  °· Several health conditions can make asthma more difficult to manage, including runny nose, sinus infections, reflux disease, psychological stress, and sleep apnea. Your caregiver will treat these conditions, as well.  °· Avoid close contact with people who have a cold or the flu, since your asthma symptoms may get worse if you catch the infection from them. Wash your hands thoroughly after touching items that may have been handled by people with a respiratory infection.  °· Get a flu shot every year to protect against the flu virus, which often makes asthma worse for days or weeks. Also get a pneumonia shot once every five to 10 years.  °Drugs: °· Aspirin and other painkillers can cause asthma attacks. 10% to 20% of people with asthma have sensitivity to aspirin or a group of painkillers called non-steroidal anti-inflammatory drugs (NSAIDS), such as ibuprofen and naproxen. These drugs are used to treat pain and reduce  fevers. Asthma attacks caused by any of these medicines can be severe and even fatal. These drugs must be avoided in people who have known aspirin sensitive asthma. Products with acetaminophen are considered safe for people who have asthma. It is important that people with aspirin sensitivity read labels of all over-the-counter drugs used to treat pain, colds, coughs, and fever.  °· Beta blockers and ACE inhibitors are other drugs which you should discuss with your caregiver, in relation to your asthma.  ° °EXERCISE  °If you have exercise-induced asthma, or are planning vigorous exercise, or exercise in cold, humid, or dry environments, prevent exercise-induced asthma by following your caregiver's advice regarding asthma treatment before exercising. °D ° °

## 2014-02-06 NOTE — ED Notes (Signed)
Pt reports hx of asthma that has been under control with no attacks x 2 years. Having increase in wheezing and sob since Tuesday and no relief with her inhalers. spo2 100% at triage and able to speak in full sentences.

## 2014-02-06 NOTE — ED Provider Notes (Signed)
CSN: [id number]     Arrival date & time 02/06/14  0830 History   First MD Initiated Contact with Patient 02/06/14 [phone]     Chief Complaint  Patient presents with  . Asthma     (Consider location/radiation/quality/duration/timing/severity/associated sxs/prior Treatment) HPI Comments: Patient with a history of Asthma presents today with a chief complaint of cough, congestion, sinus pain, chest tightness, and wheezing.  She reports that the cough and congestion have been present for the past 5 days and gradually worsening.  She reports that this morning she began having tightness in her chest and wheezing.  She tried using her Albuterol inhaler, but did not have improvement.  She denies fever, chills, or chest pain.  She reports that symptoms feel similar to symptoms that she has had in the past with an Asthma Exacerbation.  The history is provided by the patient.    Past Medical History  Diagnosis Date  . Asthma   . Diabetes mellitus   . Hypertension   . Dyslipidemia   . Pneumonia   . Bronchitis    Past Surgical History  Procedure Laterality Date  . Abdominal hysterectomy    . Hernia repair    . Knee arthroscopy    . Hemorrhoid surgery     Family History  Problem Relation Age of Onset  . Diabetes type II    . Hypertension     History  Substance Use Topics  . Smoking status: Never Smoker   . Smokeless tobacco: Not on file  . Alcohol Use: No   OB History   Grav Para Term Preterm Abortions TAB SAB Ect Mult Living                 Review of Systems  All other systems reviewed and are negative.     Allergies  Amlodipine besylate; Lisinopril; and Pantoprazole sodium  Home Medications   Prior to Admission medications   Medication Sig Start Date End Date Taking? Authorizing Provider  albuterol (PROVENTIL HFA;VENTOLIN HFA) 108 (90 BASE) MCG/ACT inhaler Inhale 2 puffs into the lungs every 4 (four) hours as needed. For shortness of breath 08/11/11  Yes [name],  MD  ALPRAZolam Duanne Moron) 0.5 MG tablet Take 0.5 mg by mouth 2 (two) times daily as needed. For anxiety   Yes Historical Provider, MD  aspirin EC 81 MG tablet Take 81 mg by mouth daily.   Yes Historical Provider, MD  atorvastatin (LIPITOR) 40 MG tablet Take 40 mg by mouth daily.   Yes Historical Provider, MD  esomeprazole (NEXIUM) 40 MG capsule Take 40 mg by mouth daily before breakfast.   Yes Historical Provider, MD  estradiol (ESTRACE) 1 MG tablet Take 1 mg by mouth daily.   Yes Historical Provider, MD  metFORMIN (GLUCOPHAGE) 1000 MG tablet Take 1,000 mg by mouth 2 (two) times daily with a meal.   Yes Historical Provider, MD  potassium chloride SA (K-DUR,KLOR-CON) 20 MEQ tablet Take 20 mEq by mouth daily.   Yes Historical Provider, MD  sulfamethoxazole-trimethoprim (BACTRIM DS) 800-160 MG per tablet Take 1 tablet by mouth 2 (two) times daily. 02/04/14 02/14/14 Yes Historical Provider, MD  valsartan-hydrochlorothiazide (DIOVAN-HCT) 320-25 MG per tablet Take 1 tablet by mouth daily.   Yes Historical Provider, MD  albuterol (PROVENTIL) (2.5 MG/3ML) 0.083% nebulizer solution Take 2.5 mg by nebulization every 4 (four) hours as needed. For shortness of breath    Historical Provider, MD   BP 179/79  Pulse 82  Temp(Src) 98.1 F (36.7  C) (Oral)  Resp 12  SpO2 100% Physical Exam  Nursing note and vitals reviewed. Constitutional: She appears well-developed and well-nourished.  HENT:  Head: Normocephalic and atraumatic.  Right Ear: Tympanic membrane and ear canal normal.  Left Ear: Tympanic membrane and ear canal normal.  Nose: Mucosal edema present. Right sinus exhibits maxillary sinus tenderness. Right sinus exhibits no frontal sinus tenderness. Left sinus exhibits maxillary sinus tenderness. Left sinus exhibits no frontal sinus tenderness.  Mouth/Throat: Oropharynx is clear and moist.  Neck: Normal range of motion. Neck supple.  Cardiovascular: Normal rate, regular rhythm and normal heart sounds.    Pulmonary/Chest: Effort normal. She has wheezes.  Diffuse expiratory wheezing  Neurological: She is alert.  Skin: Skin is warm and dry.  Psychiatric: She has a normal mood and affect.    ED Course  Procedures (including critical care time) Labs Review Labs Reviewed - No data to display  Imaging Review No results found.   EKG Interpretation None     9:41 AM Reassessed patient.  She reports that her breathing has improved, but is still having some tightness in her lungs.  Will order another breathing treatment and reassess. 10:30 AM Reassessed patient.  Lungs CTAB.  She reports that she is breathing at baseline.    MDM   Final diagnoses:  None   Patient with a history of Asthma presenting today with cough, congestion, and wheezing.  Pulse ox 100 on RA.  Patient with improvement in symptoms after breathing treatment.  Patient discharged home with 5 day course of Prednisone.  Patient stable for discharge.  Return precautions given.    Hyman Bible, PA-C 02/06/14 1735

## 2014-02-09 ENCOUNTER — Encounter (HOSPITAL_COMMUNITY): Payer: Self-pay | Admitting: Emergency Medicine

## 2014-02-09 ENCOUNTER — Emergency Department (HOSPITAL_COMMUNITY)
Admission: EM | Admit: 2014-02-09 | Discharge: 2014-02-09 | Disposition: A | Discharge status: Home / Self Care | Payer: 59 | Attending: Emergency Medicine | Admitting: Emergency Medicine

## 2014-02-09 ENCOUNTER — Emergency Department (HOSPITAL_COMMUNITY): Payer: 59

## 2014-02-09 DIAGNOSIS — E119 Type 2 diabetes mellitus without complications: Secondary | ICD-10-CM | POA: Insufficient documentation

## 2014-02-09 DIAGNOSIS — J441 Chronic obstructive pulmonary disease with (acute) exacerbation: Secondary | ICD-10-CM | POA: Diagnosis not present

## 2014-02-09 DIAGNOSIS — J45901 Unspecified asthma with (acute) exacerbation: Secondary | ICD-10-CM

## 2014-02-09 DIAGNOSIS — Z7952 Long term (current) use of systemic steroids: Secondary | ICD-10-CM | POA: Diagnosis not present

## 2014-02-09 DIAGNOSIS — Z79899 Other long term (current) drug therapy: Secondary | ICD-10-CM | POA: Insufficient documentation

## 2014-02-09 DIAGNOSIS — Z7951 Long term (current) use of inhaled steroids: Secondary | ICD-10-CM | POA: Diagnosis not present

## 2014-02-09 DIAGNOSIS — Z7982 Long term (current) use of aspirin: Secondary | ICD-10-CM | POA: Diagnosis not present

## 2014-02-09 DIAGNOSIS — E785 Hyperlipidemia, unspecified: Secondary | ICD-10-CM | POA: Insufficient documentation

## 2014-02-09 DIAGNOSIS — I10 Essential (primary) hypertension: Secondary | ICD-10-CM | POA: Diagnosis not present

## 2014-02-09 DIAGNOSIS — Z792 Long term (current) use of antibiotics: Secondary | ICD-10-CM | POA: Diagnosis not present

## 2014-02-09 DIAGNOSIS — J45909 Unspecified asthma, uncomplicated: Secondary | ICD-10-CM | POA: Diagnosis present

## 2014-02-09 DIAGNOSIS — Z8701 Personal history of pneumonia (recurrent): Secondary | ICD-10-CM | POA: Diagnosis not present

## 2014-02-09 LAB — I-STAT CHEM 8, ED
BUN: 21 mg/dL (ref 6–23)
CALCIUM ION: 1.07 mmol/L — AB (ref 1.13–1.30)
CHLORIDE: 102 meq/L (ref 96–112)
CREATININE: 0.8 mg/dL (ref 0.50–1.10)
Glucose, Bld: 98 mg/dL (ref 70–99)
HCT: 42 % (ref 36.0–46.0)
Hemoglobin: 14.3 g/dL (ref 12.0–15.0)
Potassium: 2.9 mEq/L — CL (ref 3.7–5.3)
Sodium: 140 mEq/L (ref 137–147)
TCO2: 26 mmol/L (ref 0–100)

## 2014-02-09 LAB — CBC WITH DIFFERENTIAL/PLATELET
BASOS ABS: 0.1 10*3/uL (ref 0.0–0.1)
Basophils Relative: 1 % (ref 0–1)
EOS PCT: 0 % (ref 0–5)
Eosinophils Absolute: 0 10*3/uL (ref 0.0–0.7)
HEMATOCRIT: 38.7 % (ref 36.0–46.0)
HEMOGLOBIN: 12.9 g/dL (ref 12.0–15.0)
LYMPHS ABS: 3.5 10*3/uL (ref 0.7–4.0)
LYMPHS PCT: 32 % (ref 12–46)
MCH: 31 pg (ref 26.0–34.0)
MCHC: 33.3 g/dL (ref 30.0–36.0)
MCV: 93 fL (ref 78.0–100.0)
MONO ABS: 0.8 10*3/uL (ref 0.1–1.0)
Monocytes Relative: 7 % (ref 3–12)
NEUTROS ABS: 6.6 10*3/uL (ref 1.7–7.7)
Neutrophils Relative %: 60 % (ref 43–77)
Platelets: 320 10*3/uL (ref 150–400)
RBC: 4.16 MIL/uL (ref 3.87–5.11)
RDW: 13.2 % (ref 11.5–15.5)
WBC: 10.9 10*3/uL — AB (ref 4.0–10.5)

## 2014-02-09 LAB — I-STAT TROPONIN, ED: TROPONIN I, POC: 0.01 ng/mL (ref 0.00–0.08)

## 2014-02-09 LAB — PRO B NATRIURETIC PEPTIDE: PRO B NATRI PEPTIDE: 36.5 pg/mL (ref 0–125)

## 2014-02-09 MED ORDER — IPRATROPIUM-ALBUTEROL 0.5-2.5 (3) MG/3ML IN SOLN
3.0000 mL | Freq: Once | RESPIRATORY_TRACT | Status: AC
  Administered 2014-02-09: 3 mL via RESPIRATORY_TRACT
  Filled 2014-02-09: qty 3

## 2014-02-09 MED ORDER — PREDNISONE 20 MG PO TABS: 60.0000 mg | ORAL_TABLET | Freq: Every day | ORAL | Status: DC

## 2014-02-09 MED ORDER — ALBUTEROL (5 MG/ML) CONTINUOUS INHALATION SOLN
10.0000 mg/h | INHALATION_SOLUTION | Freq: Once | RESPIRATORY_TRACT | Status: AC
  Administered 2014-02-09: 10 mg/h via RESPIRATORY_TRACT
  Filled 2014-02-09: qty 20

## 2014-02-09 MED ORDER — METHYLPREDNISOLONE SODIUM SUCC 125 MG IJ SOLR
125.0000 mg | Freq: Once | INTRAMUSCULAR | Status: AC
  Administered 2014-02-09: 125 mg via INTRAVENOUS
  Filled 2014-02-09: qty 2

## 2014-02-09 MED ORDER — ALBUTEROL (5 MG/ML) CONTINUOUS INHALATION SOLN
10.0000 mg/h | INHALATION_SOLUTION | Freq: Once | RESPIRATORY_TRACT | Status: AC
  Administered 2014-02-09: 10 mg/h via RESPIRATORY_TRACT

## 2014-02-09 MED ORDER — POTASSIUM CHLORIDE 10 MEQ/100ML IV SOLN
10.0000 meq | Freq: Once | INTRAVENOUS | Status: AC
  Administered 2014-02-09: 10 meq via INTRAVENOUS
  Filled 2014-02-09: qty 100

## 2014-02-09 MED ORDER — MAGNESIUM SULFATE 40 MG/ML IJ SOLN
2.0000 g | Freq: Once | INTRAMUSCULAR | Status: AC
Start: 2014-02-09 — End: 2014-02-09
  Administered 2014-02-09: 2 g via INTRAVENOUS
  Filled 2014-02-09: qty 50

## 2014-02-09 MED ORDER — POTASSIUM CHLORIDE CRYS ER 20 MEQ PO TBCR
40.0000 meq | EXTENDED_RELEASE_TABLET | Freq: Once | ORAL | Status: AC
  Administered 2014-02-09: 40 meq via ORAL
  Filled 2014-02-09: qty 2

## 2014-02-09 NOTE — ED Provider Notes (Signed)
CSN: [id number]     Arrival date & time 02/09/14  0757 History   First MD Initiated Contact with Patient 02/09/14 0831     Chief Complaint  Patient presents with  . Asthma     (Consider location/radiation/quality/duration/timing/severity/associated sxs/prior Treatment) HPI  63 year old female with history of non-insulin-dependent diabetes, asthma, pneumonia, bronchitis, and hypertension presents for worsening shortness of breath. Patient report week ago she had some upper respiratory symptoms which exacerbates her asthma. She was seen in the ED 5 days ago for that complaint. She did receive a 7 days course of steroid and breathing treatments. Patient has been taking her medication but her symptoms has gotten progressively worse. Continues to report increased total breathing with wheezing. She does use her nebs 4 times prior to arrival with minimal improvement. She complains of fever, chills, productive cough, pleuritic chest pain, abdominal pain, nausea vomiting diarrhea, or rash. No prior history of intubation or ICU stay. Patient has been placed on BiPAP before, she has been admitted for last in the past. She has not checked her blood sugar for the past several days. No recent risk factors for PE.  Past Medical History  Diagnosis Date  . Asthma   . Diabetes mellitus   . Hypertension   . Dyslipidemia   . Pneumonia   . Bronchitis    Past Surgical History  Procedure Laterality Date  . Abdominal hysterectomy    . Hernia repair    . Knee arthroscopy    . Hemorrhoid surgery     Family History  Problem Relation Age of Onset  . Diabetes type II    . Hypertension     History  Substance Use Topics  . Smoking status: Never Smoker   . Smokeless tobacco: Not on file  . Alcohol Use: No   OB History   Grav Para Term Preterm Abortions TAB SAB Ect Mult Living                 Review of Systems  All other systems reviewed and are negative.     Allergies  Amlodipine besylate;  Lisinopril; and Pantoprazole sodium  Home Medications   Prior to Admission medications   Medication Sig Start Date End Date Taking? Authorizing Provider  albuterol (PROVENTIL HFA;VENTOLIN HFA) 108 (90 BASE) MCG/ACT inhaler Inhale 2 puffs into the lungs every 4 (four) hours as needed. For shortness of breath 08/11/11  Yes [name], MD  albuterol (PROVENTIL) (2.5 MG/3ML) 0.083% nebulizer solution Take 2.5 mg by nebulization every 4 (four) hours as needed. For shortness of breath   Yes Historical Provider, MD  ALPRAZolam (XANAX) 0.5 MG tablet Take 0.5 mg by mouth 2 (two) times daily as needed. For anxiety   Yes Historical Provider, MD  aspirin EC 81 MG tablet Take 81 mg by mouth daily.   Yes Historical Provider, MD  atorvastatin (LIPITOR) 40 MG tablet Take 40 mg by mouth daily.   Yes Historical Provider, MD  esomeprazole (NEXIUM) 40 MG capsule Take 40 mg by mouth daily before breakfast.   Yes Historical Provider, MD  estradiol (ESTRACE) 1 MG tablet Take 1 mg by mouth daily.   Yes Historical Provider, MD  fluticasone (FLONASE) 50 MCG/ACT nasal spray Place 2 sprays into both nostrils daily. 02/06/14  Yes Heather Laisure, PA-C  metFORMIN (GLUCOPHAGE) 1000 MG tablet Take 1,000 mg by mouth 2 (two) times daily with a meal.   Yes Historical Provider, MD  potassium chloride SA (K-DUR,KLOR-CON) 20 MEQ tablet Take 20  mEq by mouth daily.   Yes Historical Provider, MD  predniSONE (DELTASONE) 20 MG tablet Take 3 tablets (60 mg total) by mouth daily. 02/06/14  Yes Heather Laisure, PA-C  sulfamethoxazole-trimethoprim (BACTRIM DS) 800-160 MG per tablet Take 1 tablet by mouth 2 (two) times daily. 02/04/14 02/14/14 Yes Historical Provider, MD  valsartan-hydrochlorothiazide (DIOVAN-HCT) 320-25 MG per tablet Take 1 tablet by mouth daily.   Yes Historical Provider, MD   BP 153/95  Pulse 90  Temp(Src) 98.4 F (36.9 C) (Oral)  Resp 24  Ht 5\' 2"  (1.575 m)  Wt 154 lb (69.854 kg)  BMI 28.16 kg/m2  SpO2  100% Physical Exam  Nursing note and vitals reviewed. Constitutional: She is oriented to person, place, and time. She appears well-developed and well-nourished. No distress.  HENT:  Head: Atraumatic.  Eyes: Conjunctivae are normal.  Neck: Normal range of motion. Neck supple. No JVD present.  Cardiovascular: Normal rate and regular rhythm.   Pulmonary/Chest: She has wheezes (decreased breath sounds throughout with inspiratory and expiratory wheezes with prolonged expiratory phase. No accessory muscle use.).  Abdominal: Soft. There is no tenderness.  Musculoskeletal: She exhibits no edema.  Neurological: She is alert and oriented to person, place, and time.  Skin: No rash noted.  Psychiatric: She has a normal mood and affect.    ED Course  Procedures (including critical care time)  9:08 AM Patient here with progressive worsening of her asthma despite being on a course of steroids for the past 5 days and using home asthma medication. Patient is actively wheezing with significant decrease in lung sounds. Perform workup. Steroid, continuous nebs, and magnesium given. Care discussed with Dr. Aline Brochure   9:11 AM Patient is hypokalemic with a potassium of 2.9. She is currently on Diovan. We'll provide both oral and IV supplementation.  11:47 AM On repeat examination, lungs with improved aeration. Patient sats at 100% on room air. She has ambulated while maintaining excellent oxygenation. Chest x-ray is without any acute infiltrate concerning for pneumonia or any other acute cardiopulmonary disease. Patient has received supplementation for her hypokalemia likely secondary to diuretic. I recommend patient to discuss this with her primary care Dr. she does have K+ supplementation at home which she will continue. Given the prolonged duration of the symptom, I have low suspicion for acute MI, or PE, especially in the setting of wheezing. Return precautions discussed. Otherwise patient stable for  discharge   Labs Review Labs Reviewed  CBC WITH DIFFERENTIAL - Abnormal; Notable for the following:    WBC 10.9 (*)    All other components within normal limits  I-STAT CHEM 8, ED - Abnormal; Notable for the following:    Potassium 2.9 (*)    Calcium, Ion 1.07 (*)    All other components within normal limits  PRO B NATRIURETIC PEPTIDE  I-STAT TROPOININ, ED    Imaging Review Dg Chest Portable 1 View  02/09/2014   CLINICAL DATA:  Mid chest pain, cough.  EXAM: PORTABLE CHEST - 1 VIEW  COMPARISON:  03/10/2012  FINDINGS: The heart size and mediastinal contours are within normal limits. Both lungs are clear. The visualized skeletal structures are unremarkable.  IMPRESSION: No active disease.   Electronically Signed   By: Rolm Baptise M.D.   On: 02/09/2014 09:16     EKG Interpretation   Date/Time:  Monday February 09 2014 11:34:22 EDT Ventricular Rate:  112 PR Interval:  141 QRS Duration: 91 QT Interval:  348 QTC Calculation: 475 R Axis:  6 Text Interpretation:  Sinus tachycardia Multiform ventricular premature  complexes Probable left ventricular hypertrophy non-spec ecg changes in  inf leads appear unchanged Confirmed by HARRISON  MD, FORREST ([id number]) on  02/09/2014 11:46:46 AM      MDM   Final diagnoses:  Acute exacerbation of chronic obstructive airways disease with asthma    BP 167/67  Pulse 110  Temp(Src) 98.4 F (36.9 C) (Oral)  Resp 16  Ht 5\' 2"  (1.575 m)  Wt 154 lb (69.854 kg)  BMI 28.16 kg/m2  SpO2 99% tachycardia 2/2 albuterol nebs.  I have reviewed nursing notes and vital signs. I personally reviewed the imaging tests through PACS system  I reviewed available ER/hospitalization records thought the EMR     Domenic Moras, [place] 02/12/14 5621

## 2014-02-09 NOTE — ED Notes (Signed)
Pt reports asthma symptoms onset Tuesday. Pt seen at her PMD and here for same. Pt with inspiratory wheeze. Pt has tried neb treatments and inhalers at home with no relief.

## 2014-02-09 NOTE — ED Notes (Addendum)
Pt ambulated with O2 staying 98-100% Rm Air. Pt noted to have dyspnea with exertion and rest.

## 2014-02-09 NOTE — ED Notes (Signed)
Pt up ambulatory to the bathroom at this time with not difficulty or distress

## 2014-02-09 NOTE — ED Notes (Signed)
Patient lad results of Chem 8 reported to [place].

## 2014-02-09 NOTE — ED Notes (Signed)
Pt returned from bathroom; placed back on monitor, continuous pulse oximetry and blood pressure cuff; family at bedside

## 2014-02-09 NOTE — Discharge Instructions (Signed)

## 2014-02-11 NOTE — ED Provider Notes (Signed)
Medical screening examination/treatment/procedure(s) were performed by non-physician practitioner and as supervising physician I was immediately available for consultation/collaboration.   EKG Interpretation None      Rolland Porter, MD, Abram Sander   Janice Norrie, MD 02/11/14 [phone]

## 2014-02-14 NOTE — ED Provider Notes (Signed)
Medical screening examination/treatment/procedure(s) were conducted as a shared visit with non-physician practitioner(s) and myself.  I personally evaluated the patient during the encounter.   EKG Interpretation   Date/Time:  Monday February 09 2014 11:34:22 EDT Ventricular Rate:  112 PR Interval:  141 QRS Duration: 91 QT Interval:  348 QTC Calculation: 475 R Axis:   6 Text Interpretation:  Sinus tachycardia Multiform ventricular premature  complexes Probable left ventricular hypertrophy non-spec ecg changes in  inf leads appear unchanged Confirmed by Audi Wettstein  MD, Jaylanni Eltringham ([id number]) on  02/09/2014 11:46:46 AM      I interviewed and examined the patient. Lungs w/ diminished BS bilaterally, prolonged exp phase and wheezing. Cardiac exam wnl. Abdomen soft.  Sx likely related to asthma. Will tx w/ nebs/steroids. PA ordered Mg although pt not in severe distress, but not unreasonable to try. Will monitor for improvement.   Pamella Pert, MD 02/14/14 [phone]

## 2014-03-25 ENCOUNTER — Encounter (HOSPITAL_COMMUNITY): Payer: Self-pay | Admitting: *Deleted

## 2014-04-08 NOTE — Anesthesia Preprocedure Evaluation (Signed)
Anesthesia Evaluation  Patient identified by MRN, date of birth, ID band Patient awake    Reviewed: Allergy & Precautions, H&P , NPO status , Patient's Chart, lab work & pertinent test results  History of Anesthesia Complications Negative for: history of anesthetic complications  Airway Mallampati: II  TM Distance: >3 FB Neck ROM: Full    Dental no notable dental hx. (+) Dental Advisory Given   Pulmonary asthma , pneumonia -, resolved, former smoker,  breath sounds clear to auscultation  Pulmonary exam normal       Cardiovascular Exercise Tolerance: Good hypertension, Pt. on medications Rhythm:Regular Rate:Normal     Neuro/Psych PSYCHIATRIC DISORDERS Anxiety Depression negative neurological ROS  negative psych ROS   GI/Hepatic Neg liver ROS, GERD-  Medicated and Controlled,  Endo/Other  diabetes, Type 2, Oral Hypoglycemic Agents  Renal/GU negative Renal ROS  negative genitourinary   Musculoskeletal negative musculoskeletal ROS (+)   Abdominal   Peds negative pediatric ROS (+)  Hematology negative hematology ROS (+)   Anesthesia Other Findings   Reproductive/Obstetrics negative OB ROS                             Anesthesia Physical Anesthesia Plan  ASA: II  Anesthesia Plan: MAC   Post-op Pain Management:    Induction: Intravenous  Airway Management Planned: Nasal Cannula  Additional Equipment:   Intra-op Plan:   Post-operative Plan: Extubation in OR  Informed Consent: I have reviewed the patients History and Physical, chart, labs and discussed the procedure including the risks, benefits and alternatives for the proposed anesthesia with the patient or authorized representative who has indicated his/her understanding and acceptance.   Dental advisory given  Plan Discussed with: CRNA  Anesthesia Plan Comments:         Anesthesia Quick Evaluation

## 2014-04-09 ENCOUNTER — Ambulatory Visit (HOSPITAL_COMMUNITY): Payer: 59 | Admitting: Anesthesiology

## 2014-04-09 ENCOUNTER — Ambulatory Visit (HOSPITAL_COMMUNITY)
Admission: RE | Admit: 2014-04-09 | Discharge: 2014-04-09 | Disposition: A | Discharge status: Home / Self Care | Payer: 59 | Source: Ambulatory Visit | Attending: Gastroenterology | Admitting: Gastroenterology

## 2014-04-09 ENCOUNTER — Encounter (HOSPITAL_COMMUNITY): Payer: Self-pay | Admitting: Registered Nurse

## 2014-04-09 ENCOUNTER — Encounter (HOSPITAL_COMMUNITY)
Admission: RE | Disposition: A | Discharge status: Home / Self Care | Payer: Self-pay | Source: Ambulatory Visit | Attending: Gastroenterology

## 2014-04-09 DIAGNOSIS — E780 Pure hypercholesterolemia: Secondary | ICD-10-CM | POA: Insufficient documentation

## 2014-04-09 DIAGNOSIS — K219 Gastro-esophageal reflux disease without esophagitis: Secondary | ICD-10-CM | POA: Insufficient documentation

## 2014-04-09 DIAGNOSIS — Z1211 Encounter for screening for malignant neoplasm of colon: Secondary | ICD-10-CM | POA: Diagnosis present

## 2014-04-09 DIAGNOSIS — J45909 Unspecified asthma, uncomplicated: Secondary | ICD-10-CM | POA: Insufficient documentation

## 2014-04-09 DIAGNOSIS — Z87891 Personal history of nicotine dependence: Secondary | ICD-10-CM | POA: Insufficient documentation

## 2014-04-09 DIAGNOSIS — E785 Hyperlipidemia, unspecified: Secondary | ICD-10-CM | POA: Diagnosis not present

## 2014-04-09 DIAGNOSIS — I10 Essential (primary) hypertension: Secondary | ICD-10-CM | POA: Insufficient documentation

## 2014-04-09 DIAGNOSIS — F418 Other specified anxiety disorders: Secondary | ICD-10-CM | POA: Insufficient documentation

## 2014-04-09 DIAGNOSIS — D123 Benign neoplasm of transverse colon: Secondary | ICD-10-CM | POA: Diagnosis not present

## 2014-04-09 DIAGNOSIS — E119 Type 2 diabetes mellitus without complications: Secondary | ICD-10-CM | POA: Diagnosis not present

## 2014-04-09 HISTORY — PX: COLONOSCOPY WITH PROPOFOL: SHX5780

## 2014-04-09 HISTORY — PX: ESOPHAGOGASTRODUODENOSCOPY (EGD) WITH PROPOFOL: SHX5813

## 2014-04-09 HISTORY — DX: Gastro-esophageal reflux disease without esophagitis: K21.9

## 2014-04-09 LAB — GLUCOSE, CAPILLARY: GLUCOSE-CAPILLARY: 107 mg/dL — AB (ref 70–99)

## 2014-04-09 SURGERY — ESOPHAGOGASTRODUODENOSCOPY (EGD) WITH PROPOFOL
Anesthesia: Monitor Anesthesia Care

## 2014-04-09 MED ORDER — PROPOFOL 10 MG/ML IV BOLUS
INTRAVENOUS | Status: AC
  Filled 2014-04-09: qty 20

## 2014-04-09 MED ORDER — LIDOCAINE HCL (CARDIAC) 20 MG/ML IV SOLN
INTRAVENOUS | Status: AC
  Filled 2014-04-09: qty 5

## 2014-04-09 MED ORDER — BUTAMBEN-TETRACAINE-BENZOCAINE 2-2-14 % EX AERO
INHALATION_SPRAY | CUTANEOUS | Status: DC | PRN
  Administered 2014-04-09: 2 via TOPICAL

## 2014-04-09 MED ORDER — LIDOCAINE HCL (CARDIAC) 20 MG/ML IV SOLN
INTRAVENOUS | Status: DC | PRN
  Administered 2014-04-09: 100 mg via INTRAVENOUS

## 2014-04-09 MED ORDER — LACTATED RINGERS IV SOLN
INTRAVENOUS | Status: DC | PRN
  Administered 2014-04-09: 13:00:00 via INTRAVENOUS

## 2014-04-09 MED ORDER — LABETALOL HCL 5 MG/ML IV SOLN
INTRAVENOUS | Status: DC | PRN
  Administered 2014-04-09: 5 mg via INTRAVENOUS
  Administered 2014-04-09: 2.5 mg via INTRAVENOUS
  Administered 2014-04-09: 5 mg via INTRAVENOUS

## 2014-04-09 MED ORDER — METOCLOPRAMIDE HCL 5 MG/ML IJ SOLN
INTRAMUSCULAR | Status: DC | PRN
  Administered 2014-04-09: 10 mg via INTRAVENOUS

## 2014-04-09 MED ORDER — PROPOFOL INFUSION 10 MG/ML OPTIME
INTRAVENOUS | Status: DC | PRN
  Administered 2014-04-09: 200 ug/kg/min via INTRAVENOUS

## 2014-04-09 MED ORDER — GLYCOPYRROLATE 0.2 MG/ML IJ SOLN
INTRAMUSCULAR | Status: AC
  Filled 2014-04-09: qty 1

## 2014-04-09 MED ORDER — SODIUM CHLORIDE 0.9 % IV SOLN: INTRAVENOUS | Status: DC

## 2014-04-09 MED ORDER — GLYCOPYRROLATE 0.2 MG/ML IJ SOLN
INTRAMUSCULAR | Status: DC | PRN
  Administered 2014-04-09: 0.2 mg via INTRAVENOUS

## 2014-04-09 SURGICAL SUPPLY — 25 items

## 2014-04-09 NOTE — H&P (Signed)
[name] is an 63 y.o. female.   Chief Complaint: GERD, diarrhea HPI: This 63 year old female with long-standing reflux symptoms was seen by me in the office about a month ago, with a roughly 5 month history of diarrhea, consisting of 4-5 loose bowel movements per day, without any obvious provocative factor. She has continued to have that problem up to the present time. Her last colonoscopy was 10 years ago. A trial of probiotic therapy, through her primary physician, did not bring about any improvement in her symptoms.  Past Medical History  Diagnosis Date  . Asthma   . Diabetes mellitus   . Hypertension   . Dyslipidemia   . Pneumonia   . Bronchitis   . GERD (gastroesophageal reflux disease)     Past Surgical History  Procedure Laterality Date  . Abdominal hysterectomy    . Hernia repair    . Knee arthroscopy    . Hemorrhoid surgery      Family History  Problem Relation Age of Onset  . Diabetes type II    . Hypertension     Social History:  reports that she quit smoking about 10 years ago. Her smoking use included Cigarettes. She smoked 0.00 packs per day. She does not have any smokeless tobacco history on file. She reports that she does not drink alcohol or use illicit drugs.  Allergies:  Allergies  Allergen Reactions  . Amlodipine Besylate Other (See Comments)    Reaction unknown  . Lisinopril Other (See Comments)    Reaction unknown  . Pantoprazole Sodium Other (See Comments)    Reaction unknown    Medications Prior to Admission  Medication Sig Dispense Refill  . albuterol (PROVENTIL HFA;VENTOLIN HFA) 108 (90 BASE) MCG/ACT inhaler Inhale 2 puffs into the lungs every 4 (four) hours as needed. For shortness of breath 1 Inhaler 1  . albuterol (PROVENTIL) (2.5 MG/3ML) 0.083% nebulizer solution Take 2.5 mg by nebulization every 4 (four) hours as needed. For shortness of breath    . ALPRAZolam (XANAX) 0.5 MG tablet Take 0.5 mg by mouth 2 (two) times daily as needed  for anxiety.     Marland Kitchen aspirin EC 81 MG tablet Take 81 mg by mouth every morning.     Marland Kitchen atorvastatin (LIPITOR) 40 MG tablet Take 40 mg by mouth every morning.     . Cholecalciferol (VITAMIN D3) 5000 UNITS TABS Take 1 tablet by mouth every morning.    Marland Kitchen esomeprazole (NEXIUM) 40 MG capsule Take 40 mg by mouth daily before breakfast.    . estradiol (ESTRACE) 1 MG tablet Take 1 mg by mouth every morning.     . metFORMIN (GLUCOPHAGE) 1000 MG tablet Take 1,000 mg by mouth 2 (two) times daily with a meal.    . Polyethyl Glycol-Propyl Glycol (SYSTANE OP) Place 1 drop into both eyes every morning.    . potassium chloride SA (K-DUR,KLOR-CON) 20 MEQ tablet Take 20 mEq by mouth every morning.     . valsartan-hydrochlorothiazide (DIOVAN-HCT) 320-25 MG per tablet Take 1 tablet by mouth every morning.     . fluticasone (FLONASE) 50 MCG/ACT nasal spray Place 2 sprays into both nostrils daily. (Patient not taking: Reported on 04/07/2014) 16 g 2  . predniSONE (DELTASONE) 20 MG tablet Take 3 tablets (60 mg total) by mouth daily. (Patient not taking: Reported on 04/07/2014) 15 tablet 0    Results for orders placed or performed during the hospital encounter of 04/09/14 (from the past 48 hour(s))  Glucose, capillary  Status: Abnormal   Collection Time: 04/09/14 12:35 PM  Result Value Ref Range   Glucose-Capillary 107 (H) 70 - 99 mg/dL   No results found.  ROS her reflux is both postprandial and supine. She has had some recent asthma symptoms, no bleeding or fevers  Blood pressure 200/89, pulse 65, temperature 98.6 F (37 C), temperature source Oral, resp. rate 17, height 5\' 2"  (1.575 m), weight 70.308 kg (155 lb), SpO2 96 %. Physical Exam this is a healthy-appearing female who is resting comfortably in bed. She is anicteric. The chest is clear, the heart is without murmur or arrhythmia, and the skin is warm and dry. The abdomen is nondistended, soft and nontender  Assessment/Plan 1. GERD, stable on medication  (long-standing PPI therapy, Nexium twice daily) 2. Diarrhea of 6 months duration  Plan: Endoscopy and colonoscopy under propofol sedation, with anticipated random colonic mucosal biopsies to look for evidence of microscopic colitis  Telicia Hodgkiss V 04/09/2014, 1:16 PM

## 2014-04-09 NOTE — Op Note (Signed)
[facility] [place], [address]   ENDOSCOPY PROCEDURE REPORT  PATIENT: [name], [name]  MR#: [id number] BIRTHDATE: 04/05/1951 , 78  yrs. old GENDER: female ENDOSCOPIST:Drenda Sobecki [place], [place] REFERRED BY: Dr. Samara Snide PROCEDURE DATE:  April 22, 2014 PROCEDURE:   upper endoscopy ASA CLASS:    3 INDICATIONS: gastroesophageal reflux disease, of long-standing. Note that the patient has had prior esophageal dilatations, but is currently free of dysphagia symptoms MEDICATION: MAC TOPICAL ANESTHETIC:   none  DESCRIPTION OF PROCEDURE:   After the risks and benefits of the procedure were explained, informed consent was obtained.  the patient came as an outpatient to the [facility] long endoscopy unit. Time out was performedThe standard adult video  endoscope was introduced through the mouth  and advanced to the second portion of the duodenum .  The instrument was slowly withdrawn as the mucosa was fully examined.  Careful attention was given to the possibility of aspiration, which the patient had had on the previous surgical procedure, so her head was elevated, the patient was in the left lateral decubitus position, and suction was ready. However, there was no retained fluid in the stomach or any regurgitation of gastric contents or evidence of aspiration during this procedure.    this was a normal examination. The vocal cords were not well seen, as the scope was passed under direct vision into the esophagus. The esophagus was normal, without evidence of stricture or ring, or any hiatal hernia, infection, neoplasia, or varices. In particular, there was no evidence of reflux esophagitis or Barrett's esophagus to correlate with the patient's reflux symptomatology, nor was any free reflux of gastric contents noted during this procedure.  The stomach was entered. It contained no residual to speak of, just a minimal amount of clear fluid. The gastric mucosa was  normal, without evidence of gastritis, erosions, ulcers, polyps, or masses. I retroflexed view of the cardia was normal.  The pylorus, duodenal bulb, and second duodenum looked normal. retroflexion was normal. No biopsies were obtained.  The scope was then withdrawn from the patient and the procedure completed.  COMPLICATIONS: There were no immediate complications.  ENDOSCOPIC IMPRESSION: Normal endoscopy. No evidence of adverse mucosal sequelae of patient's reflux.   RECOMMENDATIONS: continue current medications with consideration for office follow-up to try to optimize control of symptoms   _______________________________ eSigned:  Ronald Lobo, MD 04-22-2014 2:42 PM     cc:  CPT CODES: ICD CODES:  The ICD and CPT codes recommended by this software are interpretations from the data that the clinical staff has captured with the software.  The verification of the translation of this report to the ICD and CPT codes and modifiers is the sole responsibility of the health care institution and practicing physician where this report was generated.  [place]. will not be held responsible for the validity of the ICD and CPT codes included on this report.  AMA assumes no liability for data contained or not contained herein. CPT is a Designer, television/film set of the [employer].  PATIENT NAME:  Shephanie, Romas MR#: [id number]

## 2014-04-09 NOTE — Op Note (Signed)
[facility] [place], [address]   COLONOSCOPY PROCEDURE REPORT  PATIENT: [name], [name]  MR#: [id number] BIRTHDATE: [birth year] , 41  yrs. old GENDER: female ENDOSCOPIST: Ronald Lobo, MD REFERRED BY:  Dr. Samara Snide PROCEDURE DATE:  28-Apr-2014 PROCEDURE:   colonoscopy with biopsy ASA CLASS:   3 INDICATIONS: diarrheaof 6 months duration, unresponsive to probiotic therapy. Also, need for screening (last colonoscopy 10 years ago) MEDICATIONS: Monitored anesthesia care  DESCRIPTION OF PROCEDURE:   After the risks and benefits and of the procedure were explained, informed consent was obtained.the patient came as an outpatient to the [facility] long endoscopy unit. Particular care was given in view of the fact that the patient had had aspiration with her prior surgical procedure. This included being ready with suction and making sure that her stomach was completely suctioned out at the time of her endoscopy which immediately preceded this examination.  digital exam was normal         The Pentax Ped Colon [id number]  endoscope was introduced through the anus and advanced to the terminal ileum      .  The quality of the prep was excellent      .  The instrument was then slowly withdrawn as the colon was fully examined.   there was a 2 mm polyp in the transverse colon removed by a single cold biopsy. Otherwise, this was a completely normal examination, without evidence of ileitis, colitis, diverticulosis, large polyps, masses, or vascular ectasia  .        retroflexion in the rectum was normal.  Random mucosal biopsies were obtained along the length of the colon to check for microscopic colitis, but I did not take biopsies in the rectum so as to avoid a false positive interpretation of collagenous colitis.  The scope was then withdrawn from the patient and the procedure completed.  WITHDRAWAL TIME: 13 minutes  COMPLICATIONS: There were no immediate  complications.  ENDOSCOPIC IMPRESSION: 1. Diminutive colonic polyp 2. No source for patient's diarrhea or alteration of bowel habit endoscopically evident   RECOMMENDATIONS: await pathology on today's biopsies. If the polyp is adenomatous, consider repeat colonoscopy for surveillance in 5 years. If the random biopsies do not show a specific treatable cause for the patient's diarrhea, consider medical therapy with Lotronex, Imodium, or similar agents.   REPEAT EXAM: to be arranged, depending on histologic findings  cc:Dr. Samara Snide  _______________________________ eSigned:  Ronald Lobo, MD 04-28-14 2:33 PM   CPT CODES: ICD CODES:  The ICD and CPT codes recommended by this software are interpretations from the data that the clinical staff has captured with the software.  The verification of the translation of this report to the ICD and CPT codes and modifiers is the sole responsibility of the health care institution and practicing physician where this report was generated.  [employer] will not be held responsible for the validity of the ICD and CPT codes included on this report.  AMA assumes no liability for data contained or not contained herein. CPT is a Designer, television/film set of the [employer].   PATIENT NAME:  [name], [name] MR#: [id number]

## 2014-04-09 NOTE — Transfer of Care (Signed)
Immediate Anesthesia Transfer of Care Note  Patient: [name]  Procedure(s) Performed: Procedure(s): ESOPHAGOGASTRODUODENOSCOPY (EGD) WITH PROPOFOL (N/A) COLONOSCOPY WITH PROPOFOL (N/A)  Patient Location: PACU  Anesthesia Type:MAC  Level of Consciousness: awake, alert , oriented and patient cooperative  Airway & Oxygen Therapy: Patient Spontanous Breathing and Patient connected to nasal cannula oxygen  Post-op Assessment: Report given to PACU RN, Post -op Vital signs reviewed and stable and Patient moving all extremities X 4  Post vital signs: stable  Complications: No apparent anesthesia complications

## 2014-04-09 NOTE — Discharge Instructions (Signed)
Colonoscopy, Care After °These instructions give you information on caring for yourself after your procedure. Your doctor may also give you more specific instructions. Call your doctor if you have any problems or questions after your procedure. °HOME CARE °· Do not drive for 24 hours. °· Do not sign important papers or use machinery for 24 hours. °· You may shower. °· You may go back to your usual activities, but go slower for the first 24 hours. °· Take rest breaks often during the first 24 hours. °· Walk around or use warm packs on your belly (abdomen) if you have belly cramping or gas. °· Drink enough fluids to keep your pee (urine) clear or pale yellow. °· Resume your normal diet. Avoid heavy or fried foods. °· Avoid drinking alcohol for 24 hours or as told by your doctor. °· Only take medicines as told by your doctor. °If a tissue sample (biopsy) was taken during the procedure:  °· Do not take aspirin or blood thinners for 7 days, or as told by your doctor. °· Do not drink alcohol for 7 days, or as told by your doctor. °· Eat soft foods for the first 24 hours. °GET HELP IF: °You still have a small amount of blood in your poop (stool) 2-3 days after the procedure. °GET HELP RIGHT AWAY IF: °· You have more than a small amount of blood in your poop. °· You see clumps of tissue (blood clots) in your poop. °· Your belly is puffy (swollen). °· You feel sick to your stomach (nauseous) or throw up (vomit). °· You have a fever. °· You have belly pain that gets worse and medicine does not help. °MAKE SURE YOU: °· Understand these instructions. °· Will watch your condition. °· Will get help right away if you are not doing well or get worse. °Document Released: 05/20/2010 Document Revised: 04/22/2013 Document Reviewed: 12/23/2012 °ExitCare® Patient Information ©2015 [employer]. This information is not intended to replace advice given to you by your health care provider. Make sure you discuss any questions you have with  your health care provider. ° ° °Conscious Sedation, Adult, Care After °Refer to this sheet in the next few weeks. These instructions provide you with information on caring for yourself after your procedure. Your health care provider may also give you more specific instructions. Your treatment has been planned according to current medical practices, but problems sometimes occur. Call your health care provider if you have any problems or questions after your procedure. °WHAT TO EXPECT AFTER THE PROCEDURE  °After your procedure: °· You may feel sleepy, clumsy, and have poor balance for several hours. °· Vomiting may occur if you eat too soon after the procedure. °HOME CARE INSTRUCTIONS °· Do not participate in any activities where you could become injured for at least 24 hours. Do not: °¨ Drive. °¨ Swim. °¨ Ride a bicycle. °¨ Operate heavy machinery. °¨ Cook. °¨ Use power tools. °¨ Climb ladders. °¨ Work from a high place. °· Do not make important decisions or sign legal documents until you are improved. °· If you vomit, drink water, juice, or soup when you can drink without vomiting. Make sure you have little or no nausea before eating solid foods. °· Only take over-the-counter or prescription medicines for pain, discomfort, or fever as directed by your health care provider. °· Make sure you and your family fully understand everything about the medicines given to you, including what side effects may occur. °· You should not drink   alcohol, take sleeping pills, or take medicines that cause drowsiness for at least 24 hours. °· If you smoke, do not smoke without supervision. °· If you are feeling better, you may resume normal activities 24 hours after you were sedated. °· Keep all appointments with your health care provider. °SEEK MEDICAL CARE IF: °· Your skin is pale or bluish in color. °· You continue to feel nauseous or vomit. °· Your pain is getting worse and is not helped by medicine. °· You have bleeding or  swelling. °· You are still sleepy or feeling clumsy after 24 hours. °SEEK IMMEDIATE MEDICAL CARE IF: °· You develop a rash. °· You have difficulty breathing. °· You develop any type of allergic problem. °· You have a fever. °MAKE SURE YOU: °· Understand these instructions. °· Will watch your condition. °· Will get help right away if you are not doing well or get worse. °Document Released: 02/05/2013 Document Reviewed: 02/05/2013 °ExitCare® Patient Information ©2015 [employer]. This information is not intended to replace advice given to you by your health care provider. Make sure you discuss any questions you have with your health care provider. ° °

## 2014-04-09 NOTE — Anesthesia Postprocedure Evaluation (Signed)
  Anesthesia Post-op Note  Patient: [name]  Procedure(s) Performed: Procedure(s) (LRB): ESOPHAGOGASTRODUODENOSCOPY (EGD) WITH PROPOFOL (N/A) COLONOSCOPY WITH PROPOFOL (N/A)  Patient Location: PACU  Anesthesia Type: MAC  Level of Consciousness: awake and alert   Airway and Oxygen Therapy: Patient Spontanous Breathing  Post-op Pain: mild  Post-op Assessment: Post-op Vital signs reviewed, Patient's Cardiovascular Status Stable, Respiratory Function Stable, Patent Airway and No signs of Nausea or vomiting  Last Vitals:  Filed Vitals:   04/09/14 1357  BP: 168/87  Pulse:   Temp:   Resp: 21    Post-op Vital Signs: stable   Complications: No apparent anesthesia complications

## 2014-04-10 ENCOUNTER — Encounter (HOSPITAL_COMMUNITY): Payer: Self-pay | Admitting: Gastroenterology

## 2014-04-10 LAB — POCT I-STAT 4, (NA,K, GLUC, HGB,HCT)
Glucose, Bld: 113 mg/dL — ABNORMAL HIGH (ref 70–99)
HCT: 39 % (ref 36.0–46.0)
Hemoglobin: 13.3 g/dL (ref 12.0–15.0)
Potassium: 3.4 mEq/L — ABNORMAL LOW (ref 3.7–5.3)
SODIUM: 140 meq/L (ref 137–147)

## 2014-07-30 ENCOUNTER — Encounter (HOSPITAL_COMMUNITY): Payer: Self-pay | Admitting: Emergency Medicine

## 2014-07-30 ENCOUNTER — Emergency Department (INDEPENDENT_AMBULATORY_CARE_PROVIDER_SITE_OTHER)
Admission: EM | Admit: 2014-07-30 | Discharge: 2014-07-30 | Disposition: A | Discharge status: Home / Self Care | Payer: PRIVATE HEALTH INSURANCE | Source: Home / Self Care | Attending: Family Medicine | Admitting: Family Medicine

## 2014-07-30 DIAGNOSIS — J40 Bronchitis, not specified as acute or chronic: Secondary | ICD-10-CM | POA: Diagnosis not present

## 2014-07-30 DIAGNOSIS — J45909 Unspecified asthma, uncomplicated: Secondary | ICD-10-CM | POA: Diagnosis not present

## 2014-07-30 DIAGNOSIS — J0100 Acute maxillary sinusitis, unspecified: Secondary | ICD-10-CM | POA: Diagnosis not present

## 2014-07-30 DIAGNOSIS — M791 Myalgia: Secondary | ICD-10-CM | POA: Diagnosis not present

## 2014-07-30 MED ORDER — ALBUTEROL SULFATE HFA 108 (90 BASE) MCG/ACT IN AERS: 2.0000 | INHALATION_SPRAY | Freq: Four times a day (QID) | RESPIRATORY_TRACT | Status: DC | PRN

## 2014-07-30 MED ORDER — PREDNISONE 50 MG PO TABS: 50.0000 mg | ORAL_TABLET | Freq: Every day | ORAL | Status: DC

## 2014-07-30 MED ORDER — FLUTICASONE PROPIONATE 50 MCG/ACT NA SUSP: 2.0000 | Freq: Every day | NASAL | Status: DC

## 2014-07-30 MED ORDER — IPRATROPIUM-ALBUTEROL 0.5-2.5 (3) MG/3ML IN SOLN
RESPIRATORY_TRACT | Status: AC
  Filled 2014-07-30: qty 3

## 2014-07-30 MED ORDER — IPRATROPIUM-ALBUTEROL 0.5-2.5 (3) MG/3ML IN SOLN
3.0000 mL | Freq: Once | RESPIRATORY_TRACT | Status: AC
  Administered 2014-07-30: 3 mL via RESPIRATORY_TRACT

## 2014-07-30 MED ORDER — AZITHROMYCIN 250 MG PO TABS: 250.0000 mg | ORAL_TABLET | Freq: Every day | ORAL | Status: DC

## 2014-07-30 NOTE — Discharge Instructions (Signed)
Thank you for coming in today. °Call or go to the emergency room if you get worse, have trouble breathing, have chest pains, or palpitations.  ° °Acute Bronchitis °Bronchitis is inflammation of the airways that extend from the windpipe into the lungs (bronchi). The inflammation often causes mucus to develop. This leads to a cough, which is the most common symptom of bronchitis.  °In acute bronchitis, the condition usually develops suddenly and goes away over time, usually in a couple weeks. Smoking, allergies, and asthma can make bronchitis worse. Repeated episodes of bronchitis may cause further lung problems.  °CAUSES °Acute bronchitis is most often caused by the same virus that causes a cold. The virus can spread from person to person (contagious) through coughing, sneezing, and touching contaminated objects. °SIGNS AND SYMPTOMS  °· Cough.   °· Fever.   °· Coughing up mucus.   °· Body aches.   °· Chest congestion.   °· Chills.   °· Shortness of breath.   °· Sore throat.   °DIAGNOSIS  °Acute bronchitis is usually diagnosed through a physical exam. Your health care provider will also ask you questions about your medical history. Tests, such as chest X-rays, are sometimes done to rule out other conditions.  °TREATMENT  °Acute bronchitis usually goes away in a couple weeks. Oftentimes, no medical treatment is necessary. Medicines are sometimes given for relief of fever or cough. Antibiotic medicines are usually not needed but may be prescribed in certain situations. In some cases, an inhaler may be recommended to help reduce shortness of breath and control the cough. A cool mist vaporizer may also be used to help thin bronchial secretions and make it easier to clear the chest.  °HOME CARE INSTRUCTIONS °· Get plenty of rest.   °· Drink enough fluids to keep your urine clear or pale yellow (unless you have a medical condition that requires fluid restriction). Increasing fluids may help thin your respiratory secretions  (sputum) and reduce chest congestion, and it will prevent dehydration.   °· Take medicines only as directed by your health care provider. °· If you were prescribed an antibiotic medicine, finish it all even if you start to feel better. °· Avoid smoking and secondhand smoke. Exposure to cigarette smoke or irritating chemicals will make bronchitis worse. If you are a smoker, consider using nicotine gum or skin patches to help control withdrawal symptoms. Quitting smoking will help your lungs heal faster.   °· Reduce the chances of another bout of acute bronchitis by washing your hands frequently, avoiding people with cold symptoms, and trying not to touch your hands to your mouth, nose, or eyes.   °· Keep all follow-up visits as directed by your health care provider.   °SEEK MEDICAL CARE IF: °Your symptoms do not improve after 1 week of treatment.  °SEEK IMMEDIATE MEDICAL CARE IF: °· You develop an increased fever or chills.   °· You have chest pain.   °· You have severe shortness of breath. °· You have bloody sputum.   °· You develop dehydration. °· You faint or repeatedly feel like you are going to pass out. °· You develop repeated vomiting. °· You develop a severe headache. °MAKE SURE YOU:  °· Understand these instructions. °· Will watch your condition. °· Will get help right away if you are not doing well or get worse. °Document Released: 05/25/2004 Document Revised: 09/01/2013 Document Reviewed: 10/08/2012 °ExitCare® Patient Information ©2015 [employer]. This information is not intended to replace advice given to you by your health care provider. Make sure you discuss any questions you have with your   health care provider.   Sinusitis Sinusitis is redness, soreness, and inflammation of the paranasal sinuses. Paranasal sinuses are air pockets within the bones of your face (beneath the eyes, the middle of the forehead, or above the eyes). In healthy paranasal sinuses, mucus is able to drain out, and air is  able to circulate through them by way of your nose. However, when your paranasal sinuses are inflamed, mucus and air can become trapped. This can allow bacteria and other germs to grow and cause infection. Sinusitis can develop quickly and last only a short time (acute) or continue over a long period (chronic). Sinusitis that lasts for more than 12 weeks is considered chronic.  CAUSES  Causes of sinusitis include:  Allergies.  Structural abnormalities, such as displacement of the cartilage that separates your nostrils (deviated septum), which can decrease the air flow through your nose and sinuses and affect sinus drainage.  Functional abnormalities, such as when the small hairs (cilia) that line your sinuses and help remove mucus do not work properly or are not present. SIGNS AND SYMPTOMS  Symptoms of acute and chronic sinusitis are the same. The primary symptoms are pain and pressure around the affected sinuses. Other symptoms include:  Upper toothache.  Earache.  Headache.  Bad breath.  Decreased sense of smell and taste.  A cough, which worsens when you are lying flat.  Fatigue.  Fever.  Thick drainage from your nose, which often is green and may contain pus (purulent).  Swelling and warmth over the affected sinuses. DIAGNOSIS  Your health care provider will perform a physical exam. During the exam, your health care provider may:  Look in your nose for signs of abnormal growths in your nostrils (nasal polyps).  Tap over the affected sinus to check for signs of infection.  View the inside of your sinuses (endoscopy) using an imaging device that has a light attached (endoscope). If your health care provider suspects that you have chronic sinusitis, one or more of the following tests may be recommended:  Allergy tests.  Nasal culture. A sample of mucus is taken from your nose, sent to a lab, and screened for bacteria.  Nasal cytology. A sample of mucus is taken from  your nose and examined by your health care provider to determine if your sinusitis is related to an allergy. TREATMENT  Most cases of acute sinusitis are related to a viral infection and will resolve on their own within 10 days. Sometimes medicines are prescribed to help relieve symptoms (pain medicine, decongestants, nasal steroid sprays, or saline sprays).  However, for sinusitis related to a bacterial infection, your health care provider will prescribe antibiotic medicines. These are medicines that will help kill the bacteria causing the infection.  Rarely, sinusitis is caused by a fungal infection. In theses cases, your health care provider will prescribe antifungal medicine. For some cases of chronic sinusitis, surgery is needed. Generally, these are cases in which sinusitis recurs more than 3 times per year, despite other treatments. HOME CARE INSTRUCTIONS   Drink plenty of water. Water helps thin the mucus so your sinuses can drain more easily.  Use a humidifier.  Inhale steam 3 to 4 times a day (for example, sit in the bathroom with the shower running).  Apply a warm, moist washcloth to your face 3 to 4 times a day, or as directed by your health care provider.  Use saline nasal sprays to help moisten and clean your sinuses.  Take medicines only as directed  by your health care provider.  If you were prescribed either an antibiotic or antifungal medicine, finish it all even if you start to feel better. SEEK IMMEDIATE MEDICAL CARE IF:  You have increasing pain or severe headaches.  You have nausea, vomiting, or drowsiness.  You have swelling around your face.  You have vision problems.  You have a stiff neck.  You have difficulty breathing. MAKE SURE YOU:   Understand these instructions.  Will watch your condition.  Will get help right away if you are not doing well or get worse. Document Released: 04/17/2005 Document Revised: 09/01/2013 Document Reviewed:  05/02/2011 [facility] Patient Information 2015 [place], [place]. This information is not intended to replace advice given to you by your health care provider. Make sure you discuss any questions you have with your health care provider.

## 2014-07-30 NOTE — ED Notes (Signed)
C/o  Cough.   Sinus pressure and pain.    Pt triaged and assessed by provider.  Provider in room before nurse.

## 2014-07-30 NOTE — ED Provider Notes (Signed)
[name] is a 64 y.o. female who presents to Urgent Care today for Sinus pain and pressure runny nose and congestion present for 4 days. Patient developed wheezing and shortness of breath this morning. Her wheezing is consistent with previous episodes of asthma exacerbations. No fevers or chills nausea vomiting or diarrhea. She has tried Flonase nasal spray which has not helped much. She feels well otherwise. No chest pains or palpitations.   Past Medical History  Diagnosis Date  . Asthma   . Diabetes mellitus   . Hypertension   . Dyslipidemia   . Pneumonia   . Bronchitis   . GERD (gastroesophageal reflux disease)    Past Surgical History  Procedure Laterality Date  . Abdominal hysterectomy    . Hernia repair    . Knee arthroscopy    . Hemorrhoid surgery    . Esophagogastroduodenoscopy (egd) with propofol N/A 04/09/2014    Procedure: ESOPHAGOGASTRODUODENOSCOPY (EGD) WITH PROPOFOL;  Surgeon: Cleotis Nipper, MD;  Location: [facility] ENDOSCOPY;  Service: Endoscopy;  Laterality: N/A;  . Colonoscopy with propofol N/A 04/09/2014    Procedure: COLONOSCOPY WITH PROPOFOL;  Surgeon: Cleotis Nipper, MD;  Location: [facility] ENDOSCOPY;  Service: Endoscopy;  Laterality: N/A;   History  Substance Use Topics  . Smoking status: Former Smoker    Types: Cigarettes    Quit date: 01/09/2004  . Smokeless tobacco: Not on file  . Alcohol Use: No   ROS as above Medications: No current facility-administered medications for this encounter.   Current Outpatient Prescriptions  Medication Sig Dispense Refill  . ALPRAZolam (XANAX) 0.5 MG tablet Take 0.5 mg by mouth 2 (two) times daily as needed for anxiety.     Marland Kitchen aspirin EC 81 MG tablet Take 81 mg by mouth every morning.     Marland Kitchen atorvastatin (LIPITOR) 40 MG tablet Take 40 mg by mouth every morning.     . valsartan-hydrochlorothiazide (DIOVAN-HCT) 320-25 MG per tablet Take 1 tablet by mouth every morning.     Marland Kitchen albuterol (PROVENTIL HFA;VENTOLIN HFA) 108 (90  BASE) MCG/ACT inhaler Inhale 2 puffs into the lungs every 6 (six) hours as needed for wheezing or shortness of breath. 1 Inhaler 2  . albuterol (PROVENTIL) (2.5 MG/3ML) 0.083% nebulizer solution Take 2.5 mg by nebulization every 4 (four) hours as needed. For shortness of breath    . azithromycin (ZITHROMAX) 250 MG tablet Take 1 tablet (250 mg total) by mouth daily. Take first 2 tablets together, then 1 every day until finished. 6 tablet 0  . Cholecalciferol (VITAMIN D3) 5000 UNITS TABS Take 1 tablet by mouth every morning.    Marland Kitchen esomeprazole (NEXIUM) 40 MG capsule Take 40 mg by mouth daily before breakfast.    . estradiol (ESTRACE) 1 MG tablet Take 1 mg by mouth every morning.     . fluticasone (FLONASE) 50 MCG/ACT nasal spray Place 2 sprays into both nostrils daily. 16 g 2  . metFORMIN (GLUCOPHAGE) 1000 MG tablet Take 1,000 mg by mouth 2 (two) times daily with a meal.    . Polyethyl Glycol-Propyl Glycol (SYSTANE OP) Place 1 drop into both eyes every morning.    . potassium chloride SA (K-DUR,KLOR-CON) 20 MEQ tablet Take 20 mEq by mouth every morning.     . predniSONE (DELTASONE) 50 MG tablet Take 1 tablet (50 mg total) by mouth daily. 5 tablet 0   Allergies  Allergen Reactions  . Amlodipine Besylate Other (See Comments)    Reaction unknown  . Lisinopril Other (See  Comments)    Reaction unknown  . Pantoprazole Sodium Other (See Comments)    Reaction unknown     Exam:  BP 155/86 mmHg  Pulse 72  Temp(Src) 98.3 F (36.8 C) (Oral)  Resp 20  SpO2 100% Gen: Well NAD HEENT: EOMI,  MMM clear nasal discharge normal tympanic membranes bilaterally. Posterior pharynx cobblestoning. Mildly tender maxillary sinuses bilaterally.  Lungs: Normal work of breathing. Coarse breath sounds bilaterally. Heart: RRR no MRG Abd: NABS, Soft. Nondistended, Nontender Exts: Brisk capillary refill, warm and well perfused.   Patient was given a 2.5/0.5 mg DuoNeb nebulizer treatment, and felt better  No results  found for this or any previous visit (from the past 24 hour(s)). No results found.  Assessment and Plan: 64 y.o. female with sinusitis and bronchitis. Asthma as a component. Treat with prednisone, Flonase, albuterol, and azithromycin.  Discussed warning signs or symptoms. Please see discharge instructions. Patient expresses understanding.     Gregor Hams, MD 07/30/14 [phone]

## 2014-08-06 ENCOUNTER — Encounter (HOSPITAL_COMMUNITY): Payer: Self-pay | Admitting: Emergency Medicine

## 2014-08-06 ENCOUNTER — Inpatient Hospital Stay (HOSPITAL_COMMUNITY): Payer: Medicare Other

## 2014-08-06 ENCOUNTER — Emergency Department (HOSPITAL_COMMUNITY): Payer: Medicare Other

## 2014-08-06 ENCOUNTER — Inpatient Hospital Stay (HOSPITAL_COMMUNITY)
Admission: EM | Admit: 2014-08-06 | Discharge: 2014-08-08 | DRG: 202 | Disposition: A | Discharge status: Home / Self Care | Payer: Medicare Other | Attending: Internal Medicine | Admitting: Internal Medicine

## 2014-08-06 DIAGNOSIS — Z7982 Long term (current) use of aspirin: Secondary | ICD-10-CM | POA: Diagnosis not present

## 2014-08-06 DIAGNOSIS — J9600 Acute respiratory failure, unspecified whether with hypoxia or hypercapnia: Secondary | ICD-10-CM

## 2014-08-06 DIAGNOSIS — Z90710 Acquired absence of both cervix and uterus: Secondary | ICD-10-CM

## 2014-08-06 DIAGNOSIS — R0602 Shortness of breath: Secondary | ICD-10-CM | POA: Diagnosis not present

## 2014-08-06 DIAGNOSIS — J9601 Acute respiratory failure with hypoxia: Secondary | ICD-10-CM | POA: Diagnosis present

## 2014-08-06 DIAGNOSIS — J0100 Acute maxillary sinusitis, unspecified: Secondary | ICD-10-CM

## 2014-08-06 DIAGNOSIS — J329 Chronic sinusitis, unspecified: Secondary | ICD-10-CM

## 2014-08-06 DIAGNOSIS — Z8701 Personal history of pneumonia (recurrent): Secondary | ICD-10-CM | POA: Diagnosis not present

## 2014-08-06 DIAGNOSIS — E119 Type 2 diabetes mellitus without complications: Secondary | ICD-10-CM | POA: Diagnosis present

## 2014-08-06 DIAGNOSIS — J45901 Unspecified asthma with (acute) exacerbation: Secondary | ICD-10-CM | POA: Diagnosis present

## 2014-08-06 DIAGNOSIS — Z87891 Personal history of nicotine dependence: Secondary | ICD-10-CM | POA: Diagnosis not present

## 2014-08-06 DIAGNOSIS — K219 Gastro-esophageal reflux disease without esophagitis: Secondary | ICD-10-CM | POA: Diagnosis present

## 2014-08-06 DIAGNOSIS — I10 Essential (primary) hypertension: Secondary | ICD-10-CM | POA: Diagnosis present

## 2014-08-06 DIAGNOSIS — E785 Hyperlipidemia, unspecified: Secondary | ICD-10-CM | POA: Diagnosis present

## 2014-08-06 DIAGNOSIS — Z86711 Personal history of pulmonary embolism: Secondary | ICD-10-CM | POA: Diagnosis not present

## 2014-08-06 LAB — BASIC METABOLIC PANEL
Anion gap: 12 (ref 5–15)
BUN: 24 mg/dL — ABNORMAL HIGH (ref 6–23)
CHLORIDE: 102 mmol/L (ref 96–112)
CO2: 25 mmol/L (ref 19–32)
Calcium: 9.3 mg/dL (ref 8.4–10.5)
Creatinine, Ser: 1.02 mg/dL (ref 0.50–1.10)
GFR calc Af Amer: 65 mL/min — ABNORMAL LOW (ref >90)
GFR calc non Af Amer: 56 mL/min — ABNORMAL LOW (ref >90)
Glucose, Bld: 152 mg/dL — ABNORMAL HIGH (ref 70–99)
Potassium: 3.5 mmol/L (ref 3.5–5.1)
Sodium: 139 mmol/L (ref 135–145)

## 2014-08-06 LAB — CBG MONITORING, ED
GLUCOSE-CAPILLARY: 200 mg/dL — AB (ref 70–99)
GLUCOSE-CAPILLARY: 261 mg/dL — AB (ref 70–99)
GLUCOSE-CAPILLARY: 166 mg/dL — AB (ref 70–99)

## 2014-08-06 LAB — CBC WITH DIFFERENTIAL/PLATELET
BASOS ABS: 0.1 10*3/uL (ref 0.0–0.1)
BASOS ABS: 0 10*3/uL (ref 0.0–0.1)
BASOS PCT: 1 % (ref 0–1)
Basophils Relative: 0 % (ref 0–1)
EOS ABS: 0.1 10*3/uL (ref 0.0–0.7)
Eosinophils Absolute: 0 10*3/uL (ref 0.0–0.7)
Eosinophils Relative: 1 % (ref 0–5)
Eosinophils Relative: 0 % (ref 0–5)
HCT: 38.4 % (ref 36.0–46.0)
HEMATOCRIT: 38.4 % (ref 36.0–46.0)
Hemoglobin: 12.8 g/dL (ref 12.0–15.0)
Hemoglobin: 12.7 g/dL (ref 12.0–15.0)
LYMPHS PCT: 33 % (ref 12–46)
LYMPHS PCT: 6 % — AB (ref 12–46)
Lymphs Abs: 3.7 10*3/uL (ref 0.7–4.0)
Lymphs Abs: 0.8 10*3/uL (ref 0.7–4.0)
MCH: 30.8 pg (ref 26.0–34.0)
MCH: 30.8 pg (ref 26.0–34.0)
MCHC: 33.3 g/dL (ref 30.0–36.0)
MCHC: 33.1 g/dL (ref 30.0–36.0)
MCV: 92.5 fL (ref 78.0–100.0)
MCV: 93.2 fL (ref 78.0–100.0)
MONO ABS: 0.2 10*3/uL (ref 0.1–1.0)
Monocytes Absolute: 0.6 10*3/uL (ref 0.1–1.0)
Monocytes Relative: 5 % (ref 3–12)
Monocytes Relative: 2 % — ABNORMAL LOW (ref 3–12)
NEUTROS ABS: 6.7 10*3/uL (ref 1.7–7.7)
NEUTROS PCT: 60 % (ref 43–77)
Neutro Abs: 12.2 10*3/uL — ABNORMAL HIGH (ref 1.7–7.7)
Neutrophils Relative %: 92 % — ABNORMAL HIGH (ref 43–77)
PLATELETS: 308 10*3/uL (ref 150–400)
Platelets: 315 10*3/uL (ref 150–400)
RBC: 4.15 MIL/uL (ref 3.87–5.11)
RBC: 4.12 MIL/uL (ref 3.87–5.11)
RDW: 13.5 % (ref 11.5–15.5)
RDW: 13.5 % (ref 11.5–15.5)
WBC: 11.2 10*3/uL — AB (ref 4.0–10.5)
WBC: 13.3 10*3/uL — AB (ref 4.0–10.5)

## 2014-08-06 LAB — COMPREHENSIVE METABOLIC PANEL
ALBUMIN: 3.7 g/dL (ref 3.5–5.2)
ALT: 27 U/L (ref 0–35)
AST: 27 U/L (ref 0–37)
Alkaline Phosphatase: 58 U/L (ref 39–117)
Anion gap: 14 (ref 5–15)
BUN: 22 mg/dL (ref 6–23)
CALCIUM: 9.1 mg/dL (ref 8.4–10.5)
CO2: 23 mmol/L (ref 19–32)
Chloride: 98 mmol/L (ref 96–112)
Creatinine, Ser: 1.05 mg/dL (ref 0.50–1.10)
GFR calc Af Amer: 63 mL/min — ABNORMAL LOW (ref >90)
GFR calc non Af Amer: 55 mL/min — ABNORMAL LOW (ref >90)
Glucose, Bld: 229 mg/dL — ABNORMAL HIGH (ref 70–99)
POTASSIUM: 3.3 mmol/L — AB (ref 3.5–5.1)
Sodium: 135 mmol/L (ref 135–145)
TOTAL PROTEIN: 6.8 g/dL (ref 6.0–8.3)
Total Bilirubin: 0.4 mg/dL (ref 0.3–1.2)

## 2014-08-06 LAB — D-DIMER, QUANTITATIVE: D-Dimer, Quant: 0.29 ug/mL-FEU (ref 0.00–0.48)

## 2014-08-06 LAB — INFLUENZA PANEL BY PCR (TYPE A & B)
H1N1 flu by pcr: NOT DETECTED
INFLAPCR: NEGATIVE
INFLBPCR: NEGATIVE

## 2014-08-06 LAB — I-STAT ARTERIAL BLOOD GAS, ED
ACID-BASE EXCESS: 2 mmol/L (ref 0.0–2.0)
Bicarbonate: 27.6 mEq/L — ABNORMAL HIGH (ref 20.0–24.0)
O2 Saturation: 100 %
PCO2 ART: 47 mmHg — AB (ref 35.0–45.0)
PH ART: 7.376 (ref 7.350–7.450)
TCO2: 29 mmol/L (ref 0–100)
pO2, Arterial: 199 mmHg — ABNORMAL HIGH (ref 80.0–100.0)

## 2014-08-06 LAB — MRSA PCR SCREENING: MRSA by PCR: NEGATIVE

## 2014-08-06 LAB — TSH: TSH: 0.515 u[IU]/mL (ref 0.350–4.500)

## 2014-08-06 MED ORDER — IPRATROPIUM-ALBUTEROL 0.5-2.5 (3) MG/3ML IN SOLN
3.0000 mL | Freq: Once | RESPIRATORY_TRACT | Status: AC
  Administered 2014-08-06: 3 mL via RESPIRATORY_TRACT
  Filled 2014-08-06: qty 3

## 2014-08-06 MED ORDER — LEVOFLOXACIN IN D5W 750 MG/150ML IV SOLN: 750.0000 mg | INTRAVENOUS | Status: DC

## 2014-08-06 MED ORDER — SODIUM CHLORIDE 0.9 % IJ SOLN
3.0000 mL | Freq: Two times a day (BID) | INTRAMUSCULAR | Status: DC
  Administered 2014-08-06 – 2014-08-08 (×3): 3 mL via INTRAVENOUS

## 2014-08-06 MED ORDER — HYDROCHLOROTHIAZIDE 25 MG PO TABS
25.0000 mg | ORAL_TABLET | Freq: Every day | ORAL | Status: DC
  Administered 2014-08-06 – 2014-08-08 (×3): 25 mg via ORAL
  Filled 2014-08-06 (×3): qty 1

## 2014-08-06 MED ORDER — BUDESONIDE 0.25 MG/2ML IN SUSP
0.2500 mg | Freq: Two times a day (BID) | RESPIRATORY_TRACT | Status: DC
  Administered 2014-08-06 – 2014-08-08 (×4): 0.25 mg via RESPIRATORY_TRACT
  Filled 2014-08-06 (×7): qty 2

## 2014-08-06 MED ORDER — ENOXAPARIN SODIUM 40 MG/0.4ML ~~LOC~~ SOLN
40.0000 mg | SUBCUTANEOUS | Status: DC
  Administered 2014-08-06 – 2014-08-08 (×3): 40 mg via SUBCUTANEOUS
  Filled 2014-08-06 (×5): qty 0.4

## 2014-08-06 MED ORDER — IPRATROPIUM-ALBUTEROL 0.5-2.5 (3) MG/3ML IN SOLN
3.0000 mL | RESPIRATORY_TRACT | Status: DC
  Administered 2014-08-06 (×2): 3 mL via RESPIRATORY_TRACT
  Filled 2014-08-06: qty 3

## 2014-08-06 MED ORDER — ASPIRIN EC 81 MG PO TBEC
81.0000 mg | DELAYED_RELEASE_TABLET | Freq: Every morning | ORAL | Status: DC
  Administered 2014-08-06 – 2014-08-08 (×3): 81 mg via ORAL
  Filled 2014-08-06 (×3): qty 1

## 2014-08-06 MED ORDER — IRBESARTAN 300 MG PO TABS
300.0000 mg | ORAL_TABLET | Freq: Every day | ORAL | Status: DC
  Administered 2014-08-06 – 2014-08-08 (×3): 300 mg via ORAL
  Filled 2014-08-06 (×3): qty 1

## 2014-08-06 MED ORDER — ACETAMINOPHEN 650 MG RE SUPP: 650.0000 mg | Freq: Four times a day (QID) | RECTAL | Status: DC | PRN

## 2014-08-06 MED ORDER — ATORVASTATIN CALCIUM 40 MG PO TABS
40.0000 mg | ORAL_TABLET | Freq: Every morning | ORAL | Status: DC
  Administered 2014-08-06 – 2014-08-08 (×3): 40 mg via ORAL
  Filled 2014-08-06 (×3): qty 1

## 2014-08-06 MED ORDER — ACETAMINOPHEN 325 MG PO TABS
650.0000 mg | ORAL_TABLET | Freq: Four times a day (QID) | ORAL | Status: DC | PRN
  Administered 2014-08-06: 650 mg via ORAL
  Filled 2014-08-06: qty 2

## 2014-08-06 MED ORDER — LEVALBUTEROL HCL 0.63 MG/3ML IN NEBU
0.6300 mg | INHALATION_SOLUTION | Freq: Four times a day (QID) | RESPIRATORY_TRACT | Status: DC | PRN
  Filled 2014-08-06: qty 3

## 2014-08-06 MED ORDER — FLUTICASONE PROPIONATE 50 MCG/ACT NA SUSP
2.0000 | Freq: Every day | NASAL | Status: DC
  Administered 2014-08-06 – 2014-08-08 (×3): 2 via NASAL
  Filled 2014-08-06: qty 16

## 2014-08-06 MED ORDER — LEVOFLOXACIN IN D5W 750 MG/150ML IV SOLN
750.0000 mg | INTRAVENOUS | Status: DC
  Administered 2014-08-06: 750 mg via INTRAVENOUS
  Filled 2014-08-06: qty 150

## 2014-08-06 MED ORDER — IPRATROPIUM BROMIDE 0.02 % IN SOLN: 0.5000 mg | RESPIRATORY_TRACT | Status: DC

## 2014-08-06 MED ORDER — HYDROCODONE-ACETAMINOPHEN 5-325 MG PO TABS
1.0000 | ORAL_TABLET | ORAL | Status: DC | PRN
Start: 2014-08-06 — End: 2014-08-08
  Administered 2014-08-06: 1 via ORAL
  Filled 2014-08-06: qty 1

## 2014-08-06 MED ORDER — LEVALBUTEROL HCL 0.63 MG/3ML IN NEBU
0.6300 mg | INHALATION_SOLUTION | Freq: Four times a day (QID) | RESPIRATORY_TRACT | Status: DC
  Administered 2014-08-06 – 2014-08-07 (×4): 0.63 mg via RESPIRATORY_TRACT
  Filled 2014-08-06 (×10): qty 3

## 2014-08-06 MED ORDER — ONDANSETRON HCL 4 MG/2ML IJ SOLN: 4.0000 mg | Freq: Four times a day (QID) | INTRAMUSCULAR | Status: DC | PRN

## 2014-08-06 MED ORDER — METHYLPREDNISOLONE SODIUM SUCC 125 MG IJ SOLR
60.0000 mg | Freq: Four times a day (QID) | INTRAMUSCULAR | Status: DC
  Administered 2014-08-06 – 2014-08-07 (×4): 60 mg via INTRAVENOUS
  Filled 2014-08-06 (×2): qty 2
  Filled 2014-08-06: qty 0.96
  Filled 2014-08-06: qty 2
  Filled 2014-08-06 (×2): qty 0.96
  Filled 2014-08-06: qty 2
  Filled 2014-08-06: qty 0.96

## 2014-08-06 MED ORDER — VALSARTAN-HYDROCHLOROTHIAZIDE 320-25 MG PO TABS: 1.0000 | ORAL_TABLET | Freq: Every morning | ORAL | Status: DC

## 2014-08-06 MED ORDER — IPRATROPIUM-ALBUTEROL 0.5-2.5 (3) MG/3ML IN SOLN
RESPIRATORY_TRACT | Status: AC
  Administered 2014-08-06: 3 mL via RESPIRATORY_TRACT
  Filled 2014-08-06: qty 3

## 2014-08-06 MED ORDER — PANTOPRAZOLE SODIUM 40 MG PO TBEC
40.0000 mg | DELAYED_RELEASE_TABLET | Freq: Every day | ORAL | Status: DC
Start: 2014-08-06 — End: 2014-08-08
  Administered 2014-08-06 – 2014-08-07 (×2): 40 mg via ORAL
  Filled 2014-08-06 (×2): qty 1

## 2014-08-06 MED ORDER — ALBUTEROL SULFATE (2.5 MG/3ML) 0.083% IN NEBU: 2.5000 mg | INHALATION_SOLUTION | RESPIRATORY_TRACT | Status: AC | PRN

## 2014-08-06 MED ORDER — POTASSIUM CHLORIDE CRYS ER 20 MEQ PO TBCR
20.0000 meq | EXTENDED_RELEASE_TABLET | Freq: Every morning | ORAL | Status: DC
  Administered 2014-08-06 – 2014-08-08 (×3): 20 meq via ORAL
  Filled 2014-08-06 (×3): qty 1

## 2014-08-06 MED ORDER — INSULIN ASPART 100 UNIT/ML ~~LOC~~ SOLN
0.0000 [IU] | Freq: Three times a day (TID) | SUBCUTANEOUS | Status: DC
  Administered 2014-08-06: 5 [IU] via SUBCUTANEOUS
  Administered 2014-08-06 – 2014-08-07 (×2): 2 [IU] via SUBCUTANEOUS
  Administered 2014-08-07 (×2): 3 [IU] via SUBCUTANEOUS
  Administered 2014-08-08 (×2): 1 [IU] via SUBCUTANEOUS
  Filled 2014-08-06 (×2): qty 1

## 2014-08-06 MED ORDER — ONDANSETRON HCL 4 MG PO TABS: 4.0000 mg | ORAL_TABLET | Freq: Four times a day (QID) | ORAL | Status: DC | PRN

## 2014-08-06 MED ORDER — IPRATROPIUM BROMIDE 0.02 % IN SOLN
0.5000 mg | Freq: Four times a day (QID) | RESPIRATORY_TRACT | Status: DC
  Administered 2014-08-06 – 2014-08-07 (×4): 0.5 mg via RESPIRATORY_TRACT
  Filled 2014-08-06 (×4): qty 2.5

## 2014-08-06 MED ORDER — METHYLPREDNISOLONE SODIUM SUCC 125 MG IJ SOLR: 40.0000 mg | Freq: Two times a day (BID) | INTRAMUSCULAR | Status: DC

## 2014-08-06 MED ORDER — ALPRAZOLAM 0.5 MG PO TABS
0.5000 mg | ORAL_TABLET | Freq: Two times a day (BID) | ORAL | Status: DC | PRN
  Administered 2014-08-06 – 2014-08-07 (×2): 0.5 mg via ORAL
  Filled 2014-08-06 (×2): qty 1

## 2014-08-06 NOTE — Progress Notes (Signed)
ABG cancelled by critic care MD

## 2014-08-06 NOTE — ED Notes (Signed)
[place], MD at bedside 

## 2014-08-06 NOTE — ED Notes (Signed)
CBG 200 

## 2014-08-06 NOTE — ED Notes (Signed)
Per EMS, pt is from home, and EMS spent 1 hour on scene with pt. Pt has been feeling SOB x 1 week, but this exacerbation woke her up from sleep tonight. Pt received 10mg  albuterol, 1mg  atrovent and 125mg  soloumedrol en route. Pt has recently finished a z-pac and a course of prednisone for cough and chest congestion.

## 2014-08-06 NOTE — ED Notes (Signed)
Patient transported upstairs.  

## 2014-08-06 NOTE — ED Notes (Addendum)
Critical Care at bedside. Breakfast Tray ordered for patient.

## 2014-08-06 NOTE — ED Provider Notes (Signed)
CSN: [id number]     Arrival date & time 08/06/14  0429 History   First MD Initiated Contact with Patient 08/06/14 [phone]     Chief Complaint  Patient presents with  . Shortness of Breath     (Consider location/radiation/quality/duration/timing/severity/associated sxs/prior Treatment) Patient is a 64 y.o. female presenting with shortness of breath. The history is provided by the patient.  Shortness of Breath She woke up this morning with severe dyspnea. She had been sick for about the last 10 days with a sinus infection. She hadn't seen a physician in urgent care 1 week ago he treated her with 5 days of prednisone and azithromycin. She thought she had been improving until this morning. There has been a cough which is productive of small amount of clear sputum. She continues to have some postnasal drainage but that had changed from green to clear area she denies fever, chills, sweats. She has an albuterol inhaler at home, but this was not helping. EMS arrived and treated her with a methylprednisolone and albuterol with ipratropium. She states she does not feel any better following these treatments.,   Past Medical History  Diagnosis Date  . Asthma   . Diabetes mellitus   . Hypertension   . Dyslipidemia   . Pneumonia   . Bronchitis   . GERD (gastroesophageal reflux disease)    Past Surgical History  Procedure Laterality Date  . Abdominal hysterectomy    . Hernia repair    . Knee arthroscopy    . Hemorrhoid surgery    . Esophagogastroduodenoscopy (egd) with propofol N/A 04/09/2014    Procedure: ESOPHAGOGASTRODUODENOSCOPY (EGD) WITH PROPOFOL;  Surgeon: Cleotis Nipper, MD;  Location: [facility];  Service: Endoscopy;  Laterality: N/A;  . Colonoscopy with propofol N/A 04/09/2014    Procedure: COLONOSCOPY WITH PROPOFOL;  Surgeon: Cleotis Nipper, MD;  Location: [facility];  Service: Endoscopy;  Laterality: N/A;   Family History  Problem Relation Age of Onset  . Diabetes type II    .  Hypertension     History  Substance Use Topics  . Smoking status: Former Smoker    Types: Cigarettes    Quit date: 01/09/2004  . Smokeless tobacco: Not on file  . Alcohol Use: No   OB History    No data available     Review of Systems  Respiratory: Positive for shortness of breath.   All other systems reviewed and are negative.     Allergies  Amlodipine besylate; Lisinopril; and Pantoprazole sodium  Home Medications   Prior to Admission medications   Medication Sig Start Date End Date Taking? Authorizing Provider  albuterol (PROVENTIL HFA;VENTOLIN HFA) 108 (90 BASE) MCG/ACT inhaler Inhale 2 puffs into the lungs every 6 (six) hours as needed for wheezing or shortness of breath. 07/30/14   Gregor Hams, MD  albuterol (PROVENTIL) (2.5 MG/3ML) 0.083% nebulizer solution Take 2.5 mg by nebulization every 4 (four) hours as needed. For shortness of breath    Historical Provider, MD  ALPRAZolam Duanne Moron) 0.5 MG tablet Take 0.5 mg by mouth 2 (two) times daily as needed for anxiety.     Historical Provider, MD  aspirin EC 81 MG tablet Take 81 mg by mouth every morning.     Historical Provider, MD  atorvastatin (LIPITOR) 40 MG tablet Take 40 mg by mouth every morning.     Historical Provider, MD  azithromycin (ZITHROMAX) 250 MG tablet Take 1 tablet (250 mg total) by mouth daily. Take first 2 tablets together,  then 1 every day until finished. 07/30/14   Gregor Hams, MD  Cholecalciferol (VITAMIN D3) 5000 UNITS TABS Take 1 tablet by mouth every morning.    Historical Provider, MD  esomeprazole (NEXIUM) 40 MG capsule Take 40 mg by mouth daily before breakfast.    Historical Provider, MD  estradiol (ESTRACE) 1 MG tablet Take 1 mg by mouth every morning.     Historical Provider, MD  fluticasone (FLONASE) 50 MCG/ACT nasal spray Place 2 sprays into both nostrils daily. 07/30/14   Gregor Hams, MD  metFORMIN (GLUCOPHAGE) 1000 MG tablet Take 1,000 mg by mouth 2 (two) times daily with a meal.     Historical Provider, MD  Polyethyl Glycol-Propyl Glycol (SYSTANE OP) Place 1 drop into both eyes every morning.    Historical Provider, MD  potassium chloride SA (K-DUR,KLOR-CON) 20 MEQ tablet Take 20 mEq by mouth every morning.     Historical Provider, MD  predniSONE (DELTASONE) 50 MG tablet Take 1 tablet (50 mg total) by mouth daily. 07/30/14   Gregor Hams, MD  valsartan-hydrochlorothiazide (DIOVAN-HCT) 320-25 MG per tablet Take 1 tablet by mouth every morning.     Historical Provider, MD   BP 183/76 mmHg  Temp(Src) 97.8 F (36.6 C) (Oral)  Resp 20  Ht 5\' 2"  (1.575 m)  Wt 150 lb (68.04 kg)  BMI 27.43 kg/m2  SpO2 99% Physical Exam  Nursing note and vitals reviewed.  64 year old female, who appears moderately dyspneic, but isin no acute distress.  she is not using accessory muscles of respiration, but has difficulty speaking in full sentences. Vital signs are significant for hypertension . Oxygen saturation is 99%, which is normal. Head is normocephalic and atraumatic. PERRLA, EOMI. Oropharynx is clear. Neck is nontender and supple without adenopathy or JVD. Back is nontender and there is no CVA tenderness. Lunghave generally decreased air flow, few rales at the right base, and diffuse expiratory wheezes . Chest is nontender. Heart has regular rate and rhythm without murmur. Abdomen is soft, flat, nontender without masses or hepatosplenomegaly and peristalsis is normoactive. Extremities have no cyanosis or edema, full range of motion is present. Skin is warm and dry without rash. Neurologic: Mental status is normal, cranial nerves are intact, there are no motor or sensory deficits.  ED Course  Procedures (including critical care time) Labs Review Results for orders placed or performed during the hospital encounter of 08/06/14  CBC with Differential  Result Value Ref Range   WBC 11.2 (H) 4.0 - 10.5 K/uL   RBC 4.15 3.87 - 5.11 MIL/uL   Hemoglobin 12.8 12.0 - 15.0 g/dL   HCT 38.4  36.0 - 46.0 %   MCV 92.5 78.0 - 100.0 fL   MCH 30.8 26.0 - 34.0 pg   MCHC 33.3 30.0 - 36.0 g/dL   RDW 13.5 11.5 - 15.5 %   Platelets 308 150 - 400 K/uL   Neutrophils Relative % 60 43 - 77 %   Neutro Abs 6.7 1.7 - 7.7 K/uL   Lymphocytes Relative 33 12 - 46 %   Lymphs Abs 3.7 0.7 - 4.0 K/uL   Monocytes Relative 5 3 - 12 %   Monocytes Absolute 0.6 0.1 - 1.0 K/uL   Eosinophils Relative 1 0 - 5 %   Eosinophils Absolute 0.1 0.0 - 0.7 K/uL   Basophils Relative 1 0 - 1 %   Basophils Absolute 0.1 0.0 - 0.1 K/uL  Basic metabolic panel  Result Value Ref Range  Sodium 139 135 - 145 mmol/L   Potassium 3.5 3.5 - 5.1 mmol/L   Chloride 102 96 - 112 mmol/L   CO2 25 19 - 32 mmol/L   Glucose, Bld 152 (H) 70 - 99 mg/dL   BUN 24 (H) 6 - 23 mg/dL   Creatinine, Ser 1.02 0.50 - 1.10 mg/dL   Calcium 9.3 8.4 - 10.5 mg/dL   GFR calc non Af Amer 56 (L) >90 mL/min   GFR calc Af Amer 65 (L) >90 mL/min   Anion gap 12 5 - 15   Imaging Review Dg Chest Port 1 View  08/06/2014   CLINICAL DATA:  Shortness of breath, history of asthma and bronchitis, pneumonia.  EXAM: PORTABLE CHEST - 1 VIEW  COMPARISON:  Chest radiograph to February 09, 2014  FINDINGS: Cardiomediastinal silhouette is unremarkable. Mild bronchitic changes. The lungs are clear without pleural effusions or focal consolidations. Trachea projects midline and there is no pneumothorax. Soft tissue planes and included osseous structures are non-suspicious.  IMPRESSION: Mild bronchitic changes without focal consolidation.   Electronically Signed   By: Elon Alas   On: 08/06/2014 05:34     EKG Interpretation   Date/Time:  Thursday August 06 2014 04:33:42 EDT Ventricular Rate:  118 PR Interval:  160 QRS Duration: 82 QT Interval:  339 QTC Calculation: 475 R Axis:   47 Text Interpretation:  Sinus tachycardia Borderline T abnormalities,  inferior leads When compared with ECG of 02/09/2014, Premature ventricular  complexes are no longer Present  Confirmed by [facility]  MD, Deneane Stifter ([id number]) on  08/06/2014 4:38:26 AM      CRITICAL CARE Performed by: YYQMG,NOIBB Total critical care time: 50 minutes Critical care time was exclusive of separately billable procedures and treating other patients. Critical care was necessary to treat or prevent imminent or life-threatening deterioration. Critical care was time spent personally by me on the following activities: development of treatment plan with patient and/or surrogate as well as nursing, discussions with consultants, evaluation of patient's response to treatment, examination of patient, obtaining history from patient or surrogate, ordering and performing treatments and interventions, ordering and review of laboratory studies, ordering and review of radiographic studies, pulse oximetry and re-evaluation of patient's condition.  MDM   Final diagnoses:  Shortness of breath  Asthma exacerbation    Respiratory tract infection with exacerbation of asthma. Chest x-ray will be obtained to look for pneumonia. At the time of my evaluation, she had already received 10 mg albuterol/0.5 mg ipratropium nebulizer from EMS, and 2.5 mg albuterol/0.5 mg ipratropium in the ED. She will clearly need to be admitted. Old records reviewed in she does have several hospitalizations for asthma exacerbation although the most recent one was in 2013.  There has been little improvement with additional albuterol with ipratropium nebulizer treatments. Chest x-ray shows no evidence of pneumonia. Case is discussed with Dr. Hal Hope of [facility] who agrees to admit the patient.  Delora Fuel, MD 04/88/89 1694

## 2014-08-06 NOTE — ED Notes (Signed)
Admitted MD at bedside. 

## 2014-08-06 NOTE — ED Notes (Signed)
Meal Tray given 

## 2014-08-06 NOTE — Progress Notes (Signed)
Name: SHARLYNN SECKINGER MRN: [id number] DOB: Feb 18, 1951    ADMISSION DATE:  08/06/2014 CONSULTATION DATE:  08/06/2014  REFERRING MD :  Nichola Sizer  CHIEF COMPLAINT:  SOB/chest tightness  BRIEF PATIENT DESCRIPTION:  64 year-old female former smoker with history of asthma since childhood, DM type 2, HTN, and HL presented to the ED 4/7 via EMS after acute onset SOB waking her from sleep. Sinus congestion & cough x 2 weeks, finished azithromycin and prednisone taper 2 days prior. Called EMS for SOB/wheezing unrelieved by albuterol. Takes no medication at baseline for asthma and infrequently uses albuterol.   SIGNIFICANT EVENTS   STUDIES:  CXR 4/7 - mild bronchitic changes without focal consolidation Echo 4/7 -    HISTORY OF PRESENT ILLNESS:  64 year-old female former smoker with history of asthma since childhood, DM type 2, HTN, and HL presented to the ED via EMS after acute onset SOB waking her from sleep. Patient has 2 week history of facial pain, nasal congestion, post-nasal drip, and productive cough that prompted urgent care visit one week ago. Patient finished course of azithromycin and prednisone taper 2 days ago. Sinus symptoms have improved, sputum production and color have improved, but has chest tightness and wheezing that had not improved using albuterol 3 times/day for past 2 days. SOB/wheezing acutely worse yesterday evening 4/6. Patient left work early, used albuterol x 2 and nasonex and went to bed. Awakened with acute SOB & wheezing at 3 am and called EMS.  Patient reports frequent hospitalizations for asthma (3-4 per year) until two years ago when asthma symptoms resolved. Currently takes no medication for asthma and only needs to use albuterol if sick. Baseline sleeps on 2 pillows "I can't lay flat" for reflux. No recent environmental changes or sick exposures.    REVIEW OF SYSTEMS:   Constitutional: Negative for fever, chills, weight loss, malaise/fatigue and diaphoresis.  HENT:  Negative for hearing loss, ear pain, nosebleeds, congestion, sore throat, neck pain, tinnitus and ear discharge.  Eyes: Negative for blurred vision, double vision, photophobia, pain, discharge and redness.  Respiratory: Negative for cough, hemoptysis, sputum production, shortness of breath, wheezing and stridor.   Cardiovascular: Negative for chest pain, palpitations, orthopnea, claudication, leg swelling and PND.  Gastrointestinal: Negative for heartburn, nausea, vomiting, abdominal pain, diarrhea, constipation, blood in stool and melena.  Genitourinary: Negative for dysuria, urgency, frequency, hematuria and flank pain.  Musculoskeletal: Negative for myalgias, back pain, joint pain and falls.  Skin: Negative for itching and rash.  Neurological: Negative for dizziness, tingling, tremors, sensory change, speech change, focal weakness, seizures, loss of consciousness, weakness and headaches.  Endo/Heme/Allergies: Negative for environmental allergies and polydipsia. Does not bruise/bleed easily.  * Positive symptoms in bold  SUBJECTIVE: Reports no decrease in wheezing/SOB after receiving "5 doses" of albuterol  VITAL SIGNS: Temp:  [97.8 F (36.6 C)] 97.8 F (36.6 C) (04/07 0434) Pulse Rate:  [85-113] 110 (04/07 0830) Resp:  [13-28] 14 (04/07 0830) BP: (148-183)/(65-92) 148/73 mmHg (04/07 0830) SpO2:  [96 %-100 %] 97 % (04/07 0830) Weight:  [68.04 kg (150 lb)] 68.04 kg (150 lb) (04/07 0434)  PHYSICAL EXAMINATION: General:  Awake female in no acute distress Neuro:  Appropriate, no focal deficits HEENT:  NCAT. + pronounced UAW wheeze Cardiovascular: S1, S2, no MGR. Sinus tach low 100s on bedside monitor.  Lungs:  Clear anteriorly, posterior expiratory and upper airway wheezes.  Abdomen:  Soft, nontender. Positive bowel sounds.  Musculoskeletal:  Intact. No extremity edema. Distal pulses intact.  Skin:  Grossly intact.    Recent Labs Lab 08/06/14 0454  NA 139  K 3.5  CL 102  CO2 25   BUN 24*  CREATININE 1.02  GLUCOSE 152*    Recent Labs Lab 08/06/14 0454  HGB 12.8  HCT 38.4  WBC 11.2*  PLT 308   Dg Chest Port 1 View  08/06/2014   CLINICAL DATA:  Shortness of breath, history of asthma and bronchitis, pneumonia.  EXAM: PORTABLE CHEST - 1 VIEW  COMPARISON:  Chest radiograph to February 09, 2014  FINDINGS: Cardiomediastinal silhouette is unremarkable. Mild bronchitic changes. The lungs are clear without pleural effusions or focal consolidations. Trachea projects midline and there is no pneumothorax. Soft tissue planes and included osseous structures are non-suspicious.  IMPRESSION: Mild bronchitic changes without focal consolidation.   Electronically Signed   By: Elon Alas   On: 08/06/2014 05:34    ASSESSMENT / PLAN:  Assessment:  Asthma Exacerbation in setting of sinusitis/URI Can't exclude element of Laryngopharangeal reflux disease (LPR) Increased vascular prominence on CXR    Recommendation:  - CT sinuses - Thyroid panel - Echo - IV levofloxacin  - IV steroids - Culture sputum  - scheduled BDs    Summary: 64 year-old former smoker with asthma and recent URI who has completed course of antibiotics and steroids admitted for asthma exacerbation.    Erick Colace ACNP-BC [place] Pager # [phone] OR # [phone] if no answer   08/06/2014, 8:57 AM  STAFF NOTE: I, Merrie Roof, MD FACP have personally reviewed patient's available data, including medical history, events of note, physical examination and test results as part of my evaluation. I have discussed with resident/NP and other care providers such as pharmacist, RN and RRT. In addition, I personally evaluated patient and elicited key findings of: concerned about asthma exac secondary to sinusitis, upper airway component from this with cord dysfxn, needs CT sinus, ABX, levo, steroids, no distress, speaking full sentences, wheezing is mostly upper with radiation to  bases, if not improved with above may need ENT assessment, also some new int prominence on pcxr, assess echo, will follow  Lavon Paganini. Titus Mould, MD, [place] Pgr: [facility] 08/06/2014 12:15 PM

## 2014-08-06 NOTE — ED Notes (Signed)
Dinner Tray ordered for patient 

## 2014-08-06 NOTE — ED Notes (Signed)
Called Pharmacy and Spoke with [place] regarding delay in Lovenox and Protonix.

## 2014-08-06 NOTE — H&P (Addendum)
, [facility] History and Physical  [name]:[id number] DOB: 1951/02/23 DOA: 08/06/2014  Referring physician: ER physician. PCP: Jacqueline Frees, MD   Chief Complaint: Shortness of breath.  HPI: [name] is a 64 y.o. female with a history of asthma, diabetes mellitus type 2 not presently on any medications, hypertension, hyperlipidemia, former smoker presents to the ER because of worsening shortness of breath. Patient states her symptoms started a week ago when patient's primary care physician placed patient on Zithromax and prednisone tapering dose. Patient had finished the course of antibiotic and prednisone 2 days ago. Last night patient became acutely short of breath. Has been having productive cough denies any fever chills chest pain. On route to the ER and EMS had given patient IV steroids. Patient was given nebulizer and at this time patient is still wheezing and has been admitted as per exacerbation. Chest x-ray shows bronchitis changes. EKG shows sinus tachycardia. Patient otherwise denies any nausea vomiting abdominal pain diarrhea fever chills.   Review of Systems: As presented in the history of presenting illness, rest negative.  Past Medical History  Diagnosis Date  . Asthma   . Diabetes mellitus   . Hypertension   . Dyslipidemia   . Pneumonia   . Bronchitis   . GERD (gastroesophageal reflux disease)    Past Surgical History  Procedure Laterality Date  . Abdominal hysterectomy    . Hernia repair    . Knee arthroscopy    . Hemorrhoid surgery    . Esophagogastroduodenoscopy (egd) with propofol N/A 04/09/2014    Procedure: ESOPHAGOGASTRODUODENOSCOPY (EGD) WITH PROPOFOL;  Surgeon: Cleotis Nipper, MD;  Location: [facility];  Service: Endoscopy;  Laterality: N/A;  . Colonoscopy with propofol N/A 04/09/2014    Procedure: COLONOSCOPY WITH PROPOFOL;  Surgeon: Cleotis Nipper, MD;  Location: [facility];  Service: Endoscopy;  Laterality: N/A;   Social  History:  reports that she quit smoking about 10 years ago. Her smoking use included Cigarettes. She does not have any smokeless tobacco history on file. She reports that she does not drink alcohol or use illicit drugs. Where does patient live at home. Can patient participate in ADLs? Yes.  Allergies  Allergen Reactions  . Amlodipine Besylate Other (See Comments)    Reaction unknown  . Lisinopril Other (See Comments)    Reaction unknown  . Pantoprazole Sodium Other (See Comments)    Reaction unknown    Family History:  Family History  Problem Relation Age of Onset  . Diabetes type II    . Hypertension        Prior to Admission medications   Medication Sig Start Date End Date Taking? Authorizing Provider  albuterol (PROVENTIL HFA;VENTOLIN HFA) 108 (90 BASE) MCG/ACT inhaler Inhale 2 puffs into the lungs every 6 (six) hours as needed for wheezing or shortness of breath. 07/30/14  Yes Gregor Hams, MD  ALPRAZolam Duanne Moron) 0.5 MG tablet Take 0.5 mg by mouth 2 (two) times daily as needed for anxiety.    Yes Historical Provider, MD  aspirin EC 81 MG tablet Take 81 mg by mouth every morning.    Yes Historical Provider, MD  atorvastatin (LIPITOR) 40 MG tablet Take 40 mg by mouth every morning.    Yes Historical Provider, MD  Cholecalciferol (VITAMIN D3) 5000 UNITS TABS Take 1 tablet by mouth every morning.   Yes Historical Provider, MD  esomeprazole (NEXIUM) 40 MG capsule Take 40 mg by mouth daily before breakfast.   Yes Historical  Provider, MD  estradiol (ESTRACE) 1 MG tablet Take 1 mg by mouth every morning.    Yes Historical Provider, MD  fluticasone (FLONASE) 50 MCG/ACT nasal spray Place 2 sprays into both nostrils daily. 07/30/14  Yes Gregor Hams, MD  Polyethyl Glycol-Propyl Glycol (SYSTANE OP) Place 1 drop into both eyes every morning.   Yes Historical Provider, MD  potassium chloride SA (K-DUR,KLOR-CON) 20 MEQ tablet Take 20 mEq by mouth every morning.    Yes Historical Provider, MD   valsartan-hydrochlorothiazide (DIOVAN-HCT) 320-25 MG per tablet Take 1 tablet by mouth every morning.    Yes Historical Provider, MD  albuterol (PROVENTIL) (2.5 MG/3ML) 0.083% nebulizer solution Take 2.5 mg by nebulization every 4 (four) hours as needed for shortness of breath.     Historical Provider, MD  azithromycin (ZITHROMAX) 250 MG tablet Take 1 tablet (250 mg total) by mouth daily. Take first 2 tablets together, then 1 every day until finished. Patient not taking: Reported on 08/06/2014 07/30/14   Gregor Hams, MD  predniSONE (DELTASONE) 50 MG tablet Take 1 tablet (50 mg total) by mouth daily. Patient not taking: Reported on 08/06/2014 07/30/14   Gregor Hams, MD    Physical Exam: Filed Vitals:   08/06/14 0545 08/06/14 0555 08/06/14 0600 08/06/14 0645  BP: 159/70  162/69 162/65  Pulse: 102  94 90  Temp:      TempSrc:      Resp: 18  21   Height:      Weight:      SpO2: 100% 98% 100% 99%     General:  Well-developed and nourished.  Eyes: Anicteric no pallor.  ENT: No discharge from the ears eyes nose more.  Neck: No mass felt. No JVD appreciated.  Cardiovascular: S1-S2 heard tachycardic.  Respiratory: Bilateral expiratory wheeze and no crepitations.  Abdomen: Soft nontender bowel sounds present. No guarding or rigidity.  Skin: No rash.  Musculoskeletal: No edema.  Psychiatric: Appears normal.  Neurologic: Alert awake oriented to time place and person. Moves all extremities.  Labs on Admission:  Basic Metabolic Panel:  Recent Labs Lab 08/06/14 0454  NA 139  K 3.5  CL 102  CO2 25  GLUCOSE 152*  BUN 24*  CREATININE 1.02  CALCIUM 9.3   Liver Function Tests: No results for input(s): AST, ALT, ALKPHOS, BILITOT, PROT, ALBUMIN in the last 168 hours. No results for input(s): LIPASE, AMYLASE in the last 168 hours. No results for input(s): AMMONIA in the last 168 hours. CBC:  Recent Labs Lab 08/06/14 0454  WBC 11.2*  NEUTROABS 6.7  HGB 12.8  HCT 38.4  MCV  92.5  PLT 308   Cardiac Enzymes: No results for input(s): CKTOTAL, CKMB, CKMBINDEX, TROPONINI in the last 168 hours.  BNP (last 3 results) No results for input(s): BNP in the last 8760 hours.  ProBNP (last 3 results)  Recent Labs  02/09/14 0855  PROBNP 36.5    CBG: No results for input(s): GLUCAP in the last 168 hours.  Radiological Exams on Admission: Dg Chest Port 1 View  08/06/2014   CLINICAL DATA:  Shortness of breath, history of asthma and bronchitis, pneumonia.  EXAM: PORTABLE CHEST - 1 VIEW  COMPARISON:  Chest radiograph to February 09, 2014  FINDINGS: Cardiomediastinal silhouette is unremarkable. Mild bronchitic changes. The lungs are clear without pleural effusions or focal consolidations. Trachea projects midline and there is no pneumothorax. Soft tissue planes and included osseous structures are non-suspicious.  IMPRESSION: Mild bronchitic changes without focal  consolidation.   Electronically Signed   By: Elon Alas   On: 08/06/2014 05:34    EKG: Independently reviewed. Sinus tachycardia.  Assessment/Plan Principal Problem:   Acute respiratory failure Active Problems:   Essential hypertension   Asthma exacerbation   Diabetes mellitus type 2, controlled   Hyperlipidemia   1. Acute respiratory failure secondary to asthma exacerbation - at this time I'll place patient on IV steroids and Pulmicort nebulizer IV Levaquin. Check influenza PCR. I have ordered ABG to check for any carbon dioxide retention. 2. Diabetes mellitus type 2 - presently on no medications. Patient has been placed on sliding-scale coverage as patient is receiving steroids closely follow CBGs. 3. Hypertension mildly uncontrolled - continue home medication and I have placed patient on when necessary IV hydralazine. Closely follow blood pressure trends. 4. Hyperlipidemia on statins.  Patient is tachycardic and has had previous history of pulmonary embolism. Check d-dimer. Check TSH.  Addendum -  ABG noted. PCo2 mildly elevated given asthma. Patient looks fatigued. Consulted PCCM Dr.Fienstein. Transfer to step down.   DVT Prophylaxis Lovenox.  Code Status: Full code.  Family Communication: None.  Disposition Plan: Admit to inpatient.    Mckinzi Eriksen N. [facility] Pager [phone].  If 7PM-7AM, please contact night-coverage [email or web address] Password [email or web address] 08/06/2014, 7:03 AM

## 2014-08-06 NOTE — ED Notes (Signed)
Pt returned from CT °

## 2014-08-06 NOTE — ED Notes (Signed)
Pt requesting Xanax as well for anxiety.

## 2014-08-06 NOTE — ED Notes (Signed)
CBG 261 

## 2014-08-06 NOTE — ED Notes (Signed)
Attempted report 

## 2014-08-06 NOTE — Progress Notes (Signed)
ANTIBIOTIC CONSULT NOTE - INITIAL  Pharmacy Consult for Levaquin Indication: bronchitis  Allergies  Allergen Reactions  . Amlodipine Besylate Other (See Comments)    Reaction unknown  . Lisinopril Other (See Comments)    Reaction unknown  . Pantoprazole Sodium Other (See Comments)    Reaction unknown    Patient Measurements: Height: 5\' 2"  (157.5 cm) Weight: 150 lb (68.04 kg) IBW/kg (Calculated) : 50.1  Vital Signs: Temp: 97.8 F (36.6 C) (04/07 1005) Temp Source: Oral (04/07 1005) BP: 153/67 mmHg (04/07 1105) Pulse Rate: 108 (04/07 1105) Intake/Output from previous day:   Intake/Output from this shift:    Labs:  Recent Labs  08/06/14 0454 08/06/14 1024  WBC 11.2* 13.3*  HGB 12.8 12.7  PLT 308 315  CREATININE 1.02 1.05   Estimated Creatinine Clearance: 48.3 mL/min (by C-G formula based on Cr of 1.05). No results for input(s): VANCOTROUGH, VANCOPEAK, VANCORANDOM, GENTTROUGH, GENTPEAK, GENTRANDOM, TOBRATROUGH, TOBRAPEAK, TOBRARND, AMIKACINPEAK, AMIKACINTROU, AMIKACIN in the last 72 hours.   Microbiology: No results found for this or any previous visit (from the past 720 hour(s)).  Medical History: Past Medical History  Diagnosis Date  . Asthma   . Diabetes mellitus   . Hypertension   . Dyslipidemia   . Pneumonia   . Bronchitis   . GERD (gastroesophageal reflux disease)     Medications:  See electronic med rec  Assessment: 64 y.o. female presents with SOB. Pt is former smoker with asthma who was recently treated for URI (completed azithromycin course and steroids).  Pt on levaquin for bronchitis, sinusitis. SCr 1.05, est CrCl 48 ml/min. WBC slightly elevated to 13.3. Influenza negative.  Goal of Therapy:  Resolution of infection  Plan: Change Levaquin to 750mg  IV q48h Will f/u renal function, micro data, and pt's clinical condition  Sherlon Handing, PharmD, BCPS Clinical pharmacist, pager [phone] 08/06/2014,11:31 AM

## 2014-08-06 NOTE — ED Notes (Signed)
Meal delivered. CBG 166.

## 2014-08-06 NOTE — Progress Notes (Signed)
Patient admitted earlier today for acute respiratory failure secondary to asthma excessive sedation. ABG stable except for mild increased in PCO2, pH is still within normal limits. She was little drowsy earlier today but this has not resolved. Currently saturating fine on room air. Agree with IV steroids, IV Levaquin and ruling out influenza. Oxygen as needed when necessary.   Critical care has been consulted this morning. We'll look for any additional changes recommended by them. Likely discharge in 1-2 days.

## 2014-08-07 DIAGNOSIS — J9600 Acute respiratory failure, unspecified whether with hypoxia or hypercapnia: Secondary | ICD-10-CM

## 2014-08-07 LAB — URINALYSIS, ROUTINE W REFLEX MICROSCOPIC
Bilirubin Urine: NEGATIVE
GLUCOSE, UA: NEGATIVE mg/dL
Hgb urine dipstick: NEGATIVE
Ketones, ur: NEGATIVE mg/dL
Leukocytes, UA: NEGATIVE
Nitrite: NEGATIVE
PH: 5 (ref 5.0–8.0)
Protein, ur: NEGATIVE mg/dL
Specific Gravity, Urine: 1.012 (ref 1.005–1.030)
Urobilinogen, UA: 0.2 mg/dL (ref 0.0–1.0)

## 2014-08-07 LAB — CBC
HCT: 35.5 % — ABNORMAL LOW (ref 36.0–46.0)
Hemoglobin: 11.8 g/dL — ABNORMAL LOW (ref 12.0–15.0)
MCH: 30.3 pg (ref 26.0–34.0)
MCHC: 33.2 g/dL (ref 30.0–36.0)
MCV: 91.3 fL (ref 78.0–100.0)
PLATELETS: 322 10*3/uL (ref 150–400)
RBC: 3.89 MIL/uL (ref 3.87–5.11)
RDW: 13.5 % (ref 11.5–15.5)
WBC: 18 10*3/uL — ABNORMAL HIGH (ref 4.0–10.5)

## 2014-08-07 LAB — BASIC METABOLIC PANEL
ANION GAP: 12 (ref 5–15)
BUN: 25 mg/dL — AB (ref 6–23)
CALCIUM: 9.4 mg/dL (ref 8.4–10.5)
CHLORIDE: 98 mmol/L (ref 96–112)
CO2: 25 mmol/L (ref 19–32)
Creatinine, Ser: 0.95 mg/dL (ref 0.50–1.10)
GFR, EST AFRICAN AMERICAN: 71 mL/min — AB (ref >90)
GFR, EST NON AFRICAN AMERICAN: 62 mL/min — AB (ref >90)
Glucose, Bld: 194 mg/dL — ABNORMAL HIGH (ref 70–99)
Potassium: 3.5 mmol/L (ref 3.5–5.1)
Sodium: 135 mmol/L (ref 135–145)

## 2014-08-07 LAB — GLUCOSE, CAPILLARY
GLUCOSE-CAPILLARY: 243 mg/dL — AB (ref 70–99)
GLUCOSE-CAPILLARY: 210 mg/dL — AB (ref 70–99)
GLUCOSE-CAPILLARY: 154 mg/dL — AB (ref 70–99)
Glucose-Capillary: 225 mg/dL — ABNORMAL HIGH (ref 70–99)
Glucose-Capillary: 172 mg/dL — ABNORMAL HIGH (ref 70–99)
Glucose-Capillary: 212 mg/dL — ABNORMAL HIGH (ref 70–99)

## 2014-08-07 MED ORDER — HYDRALAZINE HCL 50 MG PO TABS
50.0000 mg | ORAL_TABLET | Freq: Three times a day (TID) | ORAL | Status: DC
  Administered 2014-08-07 – 2014-08-08 (×4): 50 mg via ORAL
  Filled 2014-08-07 (×7): qty 1

## 2014-08-07 MED ORDER — PREDNISONE 20 MG PO TABS
20.0000 mg | ORAL_TABLET | Freq: Every day | ORAL | Status: DC
  Administered 2014-08-07 – 2014-08-08 (×2): 20 mg via ORAL
  Filled 2014-08-07 (×3): qty 1

## 2014-08-07 MED ORDER — LEVOFLOXACIN IN D5W 750 MG/150ML IV SOLN
750.0000 mg | INTRAVENOUS | Status: DC
Start: 2014-08-07 — End: 2014-08-08
  Administered 2014-08-07: 750 mg via INTRAVENOUS
  Filled 2014-08-07 (×3): qty 150

## 2014-08-07 MED ORDER — SALINE SPRAY 0.65 % NA SOLN
2.0000 | Freq: Two times a day (BID) | NASAL | Status: DC
  Administered 2014-08-07 – 2014-08-08 (×2): 2 via NASAL
  Filled 2014-08-07: qty 44

## 2014-08-07 MED ORDER — LEVALBUTEROL HCL 0.63 MG/3ML IN NEBU
0.6300 mg | INHALATION_SOLUTION | RESPIRATORY_TRACT | Status: DC | PRN
  Administered 2014-08-07: 0.63 mg via RESPIRATORY_TRACT

## 2014-08-07 MED ORDER — ARFORMOTEROL TARTRATE 15 MCG/2ML IN NEBU
15.0000 ug | INHALATION_SOLUTION | Freq: Two times a day (BID) | RESPIRATORY_TRACT | Status: DC
  Administered 2014-08-08: 15 ug via RESPIRATORY_TRACT
  Filled 2014-08-07 (×7): qty 2

## 2014-08-07 MED ORDER — METHYLPREDNISOLONE SODIUM SUCC 125 MG IJ SOLR: 60.0000 mg | Freq: Two times a day (BID) | INTRAMUSCULAR | Status: DC

## 2014-08-07 NOTE — Progress Notes (Signed)
Utilization review completed.  

## 2014-08-07 NOTE — Progress Notes (Signed)
  Echocardiogram 2D Echocardiogram has been performed.  [name] 08/07/2014, 12:06 PM

## 2014-08-07 NOTE — Progress Notes (Signed)
ANTIBIOTIC CONSULT NOTE - FOLLOW UP  Pharmacy Consult:  Levaquin Indication: bronchitis  Allergies  Allergen Reactions  . Amlodipine Besylate Other (See Comments)    Reaction unknown  . Lisinopril Other (See Comments)    Reaction unknown  . Pantoprazole Sodium Other (See Comments)    Reaction unknown    Patient Measurements: Height: 5\' 2"  (157.5 cm) Weight: 153 lb 7 oz (69.6 kg) IBW/kg (Calculated) : 50.1  Vital Signs: Temp: 97.8 F (36.6 C) (04/08 1250) Temp Source: Oral (04/08 1250) BP: 176/67 mmHg (04/08 1250) Pulse Rate: 88 (04/08 1250) Intake/Output from previous day: 04/07 0701 - 04/08 0700 In: -  Out: 725 [Urine:725] Intake/Output from this shift: Total I/O In: 240 [P.O.:240] Out: 1 [Stool:1]  Labs:  Recent Labs  08/06/14 0454 08/06/14 1024 08/07/14 0246  WBC 11.2* 13.3* 18.0*  HGB 12.8 12.7 11.8*  PLT 308 315 322  CREATININE 1.02 1.05 0.95   Estimated Creatinine Clearance: 54 mL/min (by C-G formula based on Cr of 0.95). No results for input(s): VANCOTROUGH, VANCOPEAK, VANCORANDOM, GENTTROUGH, GENTPEAK, GENTRANDOM, TOBRATROUGH, TOBRAPEAK, TOBRARND, AMIKACINPEAK, AMIKACINTROU, AMIKACIN in the last 72 hours.   Microbiology: Recent Results (from the past 720 hour(s))  MRSA PCR Screening     Status: None   Collection Time: 08/06/14  9:00 PM  Result Value Ref Range Status   MRSA by PCR NEGATIVE NEGATIVE Final    Comment:        The GeneXpert MRSA Assay (FDA approved for NASAL specimens only), is one component of a comprehensive MRSA colonization surveillance program. It is not intended to diagnose MRSA infection nor to guide or monitor treatment for MRSA infections.          Assessment: 64 y.o. female presents with SOB.  Patient is a former smoker with asthma who was recently treated for URI (completed azithromycin course and steroids).  She continues on Levaquin for bronchitis/sinusitis.  Her renal function is improving.  LVQ 4/7  >>  Flu negative   Goal of Therapy:  Resolution of infection   Plan: - Change Levaquin to 750mg  IV Q24H - Monitor renal fxn, CBGs - Pharmacy will follow peripherally to change to PO/adjust for renal fxn.   Shawnte Demarest D. Mina Marble, PharmD, BCPS Pager:  [phone] 08/07/2014, 1:16 PM

## 2014-08-07 NOTE — Progress Notes (Signed)
Pt transferred to the unit at 1406 from [facility]. Pt mental status is alert and oriented. Pt oriented to room, staff, and call bell. Skin is intact. Full assessment charted in [facility]. Call bell within reach.

## 2014-08-07 NOTE — Progress Notes (Signed)
Inpatient Diabetes Program Recommendations  AACE/ADA: New Consensus Statement on Inpatient Glycemic Control (2013)  Target Ranges:  Prepandial:   less than 140 mg/dL      Peak postprandial:   less than 180 mg/dL (1-2 hours)      Critically ill patients:  140 - 180 mg/dL   Reason for Assessment:  Results for [name], [name] (MRN [id number]) as of 08/07/2014 09:54  Ref. Range 08/06/2014 12:26 08/06/2014 18:05 08/06/2014 21:17 08/07/2014 04:49 08/07/2014 07:43  Glucose-Capillary Latest Range: 70-99 mg/dL 261 (H) 166 (H) 243 (H) 210 (H) 225 (H)   Diabetes history: Type 2 diabetes Outpatient Diabetes medications: None Current orders for Inpatient glycemic control:  Note CBG's elevated with steroids.  May consider adding Basal insulin such as Levemir 14 units daily.  Thanks, Adah Perl, RN, BC-ADM Inpatient Diabetes Coordinator Pager [phone] (8a-5p)

## 2014-08-07 NOTE — Progress Notes (Signed)
Patient Demographics  [name], is a 64 y.o. female, DOB - 1951/01/13, NFA:[id number]  Admit date - 08/06/2014   Admitting Physician [name], MD  Outpatient Primary MD for the patient is [name], MD  LOS - 1   Chief Complaint  Patient presents with  . Shortness of Breath        Subjective:   [name] today has, No headache, No chest pain, No abdominal pain - No Nausea, No new weakness tingling or numbness, Improved Cough - SOB.   Assessment & Plan    1. Acute hypoxic respiratory failure secondary to asthma exacerbation. Along with mild URI. Much improved, influenza negative, continue IV steroids at lower dose, continue Levaquin, continue supportive care with nebulizers, not on any oxygen and saturating well on room air. We will increase activity and move out of stepdown. Echogram ordered by pulmonary is pending. Likely discharge in the morning.   2. Leukocytosis. Likely due to #1 above with steroid use. Afebrile, chest x-ray nonacute, will check UA.   3. Dyslipidemia. Continue home dose statin.   4. GERD. On PPI.   5. Essential hypertension. Blood pressure slightly high on ARB. We will add Norvasc for better control. Avoid beta blockade due to asthma and reactive airway disease.     Code Status: Full  Family Communication: none  Disposition Plan: Home   Procedures CT sinuses stable.  Echogram  Consults  PCCM   Medications  Scheduled Meds: . aspirin EC  81 mg Oral q morning - 10a  . atorvastatin  40 mg Oral q morning - 10a  . budesonide (PULMICORT) nebulizer solution  0.25 mg Nebulization BID  . enoxaparin (LOVENOX) injection  40 mg Subcutaneous Q24H  . fluticasone  2 spray Each Nare Daily  . hydrochlorothiazide  25 mg Oral Daily  . insulin  aspart  0-9 Units Subcutaneous TID WC  . ipratropium  0.5 mg Nebulization Q6H  . irbesartan  300 mg Oral Daily  . levalbuterol  0.63 mg Nebulization Q6H  . [START ON 08/08/2014] levofloxacin (LEVAQUIN) IV  750 mg Intravenous Q48H  . methylPREDNISolone (SOLU-MEDROL) injection  60 mg Intravenous Q6H  . pantoprazole  40 mg Oral Daily  . potassium chloride SA  20 mEq Oral q morning - 10a  . sodium chloride  3 mL Intravenous Q12H   Continuous Infusions:  PRN Meds:.acetaminophen **OR** acetaminophen, ALPRAZolam, HYDROcodone-acetaminophen, levalbuterol, ondansetron **OR** ondansetron (ZOFRAN) IV  DVT Prophylaxis  Lovenox   Lab Results  Component Value Date   PLT 322 08/07/2014    Antibiotics     Anti-infectives    Start     Dose/Rate Route Frequency Ordered Stop   08/08/14 1000  levofloxacin (LEVAQUIN) IVPB 750 mg     750 mg 100 mL/hr over 90 Minutes Intravenous Every 48 hours 08/06/14 1139     08/06/14 1000  levofloxacin (LEVAQUIN) IVPB 750 mg  Status:  Discontinued     750 mg 100 mL/hr over 90 Minutes Intravenous Every 24 hours 08/06/14 0955 08/06/14 1139          Objective:   Filed Vitals:   08/07/14 0103 08/07/14 0400 08/07/14 0748 08/07/14 0800  BP: 154/74 156/75  156/70  Pulse: 82 88  105  Temp:  98.1 F (36.7 C)  98.6 F (37 C)  TempSrc:  Oral  Oral  Resp: 14 18  19   Height:      Weight:      SpO2: 100% 100% 100% 99%    Wt Readings from Last 3 Encounters:  08/06/14 69.6 kg (153 lb 7 oz)  04/09/14 70.308 kg (155 lb)  02/09/14 69.854 kg (154 lb)     Intake/Output Summary (Last 24 hours) at 08/07/14 0905 Last data filed at 08/07/14 0505  Gross per 24 hour  Intake      0 ml  Output    725 ml  Net   -725 ml     Physical Exam  Awake Alert, Oriented X 3, No new F.N deficits, Normal affect [place].AT,PERRAL Supple Neck,No JVD, No cervical lymphadenopathy appriciated.  Symmetrical Chest wall movement, Good air movement bilaterally, CTAB RRR,No Gallops,Rubs or  new Murmurs, No Parasternal Heave +ve B.Sounds, Abd Soft, No tenderness, No organomegaly appriciated, No rebound - guarding or rigidity. No Cyanosis, Clubbing or edema, No new Rash or bruise     Data Review   Micro Results Recent Results (from the past 240 hour(s))  MRSA PCR Screening     Status: None   Collection Time: 08/06/14  9:00 PM  Result Value Ref Range Status   MRSA by PCR NEGATIVE NEGATIVE Final    Comment:        The GeneXpert MRSA Assay (FDA approved for NASAL specimens only), is one component of a comprehensive MRSA colonization surveillance program. It is not intended to diagnose MRSA infection nor to guide or monitor treatment for MRSA infections.     Radiology Reports Dg Chest Port 1 View  08/06/2014   CLINICAL DATA:  Shortness of breath, history of asthma and bronchitis, pneumonia.  EXAM: PORTABLE CHEST - 1 VIEW  COMPARISON:  Chest radiograph to February 09, 2014  FINDINGS: Cardiomediastinal silhouette is unremarkable. Mild bronchitic changes. The lungs are clear without pleural effusions or focal consolidations. Trachea projects midline and there is no pneumothorax. Soft tissue planes and included osseous structures are non-suspicious.  IMPRESSION: Mild bronchitic changes without focal consolidation.   Electronically Signed   By: Elon Alas   On: 08/06/2014 05:34   [place] Cm  08/06/2014   CLINICAL DATA:  History of sinus infections.  Congestion.  EXAM: CT PARANASAL SINUS LIMITED WITHOUT CONTRAST  TECHNIQUE: Non-contiguous multidetector CT images of the paranasal sinuses were obtained in a single plane without contrast.  COMPARISON:  Sinus CT scan 07/07/2008.  FINDINGS: The paranasal sinuses are clear. The mastoid air cells also appear clear. Limited visualization of the orbits is unremarkable. Imaged intracranial contents appear normal.  IMPRESSION: Negative exam.   Electronically Signed   By: Inge Rise M.D.   On: 08/06/2014 11:06       CBC  Recent Labs Lab 08/06/14 0454 08/06/14 1024 08/07/14 0246  WBC 11.2* 13.3* 18.0*  HGB 12.8 12.7 11.8*  HCT 38.4 38.4 35.5*  PLT 308 315 322  MCV 92.5 93.2 91.3  MCH 30.8 30.8 30.3  MCHC 33.3 33.1 33.2  RDW 13.5 13.5 13.5  LYMPHSABS 3.7 0.8  --   MONOABS 0.6 0.2  --   EOSABS 0.1 0.0  --   BASOSABS 0.1 0.0  --     Chemistries   Recent Labs Lab 08/06/14 0454 08/06/14 1024 08/07/14 0246  NA 139 135 135  K 3.5 3.3* 3.5  CL 102 98 98  CO2 25 23 25   GLUCOSE 152* 229* 194*  BUN 24* 22 25*  CREATININE 1.02 1.05 0.95  CALCIUM 9.3 9.1 9.4  AST  --  27  --   ALT  --  27  --   ALKPHOS  --  58  --   BILITOT  --  0.4  --    ------------------------------------------------------------------------------------------------------------------ estimated creatinine clearance is 54 mL/min (by C-G formula based on Cr of 0.95). ------------------------------------------------------------------------------------------------------------------ No results for input(s): HGBA1C in the last 72 hours. ------------------------------------------------------------------------------------------------------------------ No results for input(s): CHOL, HDL, LDLCALC, TRIG, CHOLHDL, LDLDIRECT in the last 72 hours. ------------------------------------------------------------------------------------------------------------------  Recent Labs  08/06/14 1024  TSH 0.515   ------------------------------------------------------------------------------------------------------------------ No results for input(s): VITAMINB12, FOLATE, FERRITIN, TIBC, IRON, RETICCTPCT in the last 72 hours.  Coagulation profile No results for input(s): INR, PROTIME in the last 168 hours.   Recent Labs  08/06/14 0836  DDIMER 0.29    Cardiac Enzymes No results for input(s): CKMB, TROPONINI, MYOGLOBIN in the last 168 hours.  Invalid input(s):  CK ------------------------------------------------------------------------------------------------------------------ Invalid input(s): POCBNP     Time Spent in minutes   35   Dwain Huhn K M.D on 08/07/2014 at 9:05 AM  Between 7am to 7pm - Pager - [phone]  After 7pm go to [email or web address] - password [facility]  [facility]   Office  [phone]

## 2014-08-07 NOTE — Progress Notes (Signed)
Pt arrived from ED via stretcher. Pt ambulated from stretcher to bed. CHG bath given and MRSA swab sent to lab. BP 160/79, HR 101, oxygen saturation 100% on room air. Will continue to monitor.   Hart Rochester, RN, BSN

## 2014-08-07 NOTE — Progress Notes (Signed)
Name: [name] MRN: [id number] DOB: [birth year]    ADMISSION DATE:  08/06/2014 CONSULTATION DATE:  08/06/2014  REFERRING MD :  Nichola Sizer  CHIEF COMPLAINT:  SOB/chest tightness  BRIEF PATIENT DESCRIPTION:  64 yo female former smoker presented with sudden onset of dyspnea while asleep.  She had sinus congestion and cough for 2 weeks >> tx with zithromax and prednisone as outpt.  She has hx of asthma.  STUDIES: CT sinus 4/07 >> negative exam Echo 4/08 >>   SUBJECTIVE:  Breathing much better.  Still has sinus pressure.  C/o heartburn.  VITAL SIGNS: Temp:  [97.9 F (36.6 C)-98.6 F (37 C)] 98.6 F (37 C) (04/08 0800) Pulse Rate:  [77-120] 103 (04/08 0900) Resp:  [14-27] 19 (04/08 0900) BP: (137-171)/(60-79) 171/73 mmHg (04/08 1003) SpO2:  [96 %-100 %] 100 % (04/08 0900) Weight:  [153 lb 7 oz (69.6 kg)] 153 lb 7 oz (69.6 kg) (04/07 2107)  PHYSICAL EXAMINATION: General: no distress Neuro: normal strength HEENT: no stridor Cardiovascular: regular  Lungs: no wheeze Abdomen:  Soft, nontender Musculoskeletal: no edema Skin: no rashes   CMP Latest Ref Rng 08/07/2014 08/06/2014 08/06/2014  Glucose 70 - 99 mg/dL 194(H) 229(H) 152(H)  BUN 6 - 23 mg/dL 25(H) 22 24(H)  Creatinine 0.50 - 1.10 mg/dL 0.95 1.05 1.02  Sodium 135 - 145 mmol/L 135 135 139  Potassium 3.5 - 5.1 mmol/L 3.5 3.3(L) 3.5  Chloride 96 - 112 mmol/L 98 98 102  CO2 19 - 32 mmol/L 25 23 25   Calcium 8.4 - 10.5 mg/dL 9.4 9.1 9.3  Total Protein 6.0 - 8.3 g/dL - 6.8 -  Total Bilirubin 0.3 - 1.2 mg/dL - 0.4 -  Alkaline Phos 39 - 117 U/L - 58 -  AST 0 - 37 U/L - 27 -  ALT 0 - 35 U/L - 27 -    CBC Latest Ref Rng 08/07/2014 08/06/2014 08/06/2014  WBC 4.0 - 10.5 K/uL 18.0(H) 13.3(H) 11.2(H)  Hemoglobin 12.0 - 15.0 g/dL 11.8(L) 12.7 12.8  Hematocrit 36.0 - 46.0 % 35.5(L) 38.4 38.4  Platelets 150 - 400 K/uL 322 315 308    Dg Chest Port 1 View  08/06/2014   CLINICAL DATA:  Shortness of breath, history of asthma and  bronchitis, pneumonia.  EXAM: PORTABLE CHEST - 1 VIEW  COMPARISON:  Chest radiograph to February 09, 2014  FINDINGS: Cardiomediastinal silhouette is unremarkable. Mild bronchitic changes. The lungs are clear without pleural effusions or focal consolidations. Trachea projects midline and there is no pneumothorax. Soft tissue planes and included osseous structures are non-suspicious.  IMPRESSION: Mild bronchitic changes without focal consolidation.   Electronically Signed   By: Elon Alas   On: 08/06/2014 05:34   [place] Cm  08/06/2014   CLINICAL DATA:  History of sinus infections.  Congestion.  EXAM: CT PARANASAL SINUS LIMITED WITHOUT CONTRAST  TECHNIQUE: Non-contiguous multidetector CT images of the paranasal sinuses were obtained in a single plane without contrast.  COMPARISON:  Sinus CT scan 07/07/2008.  FINDINGS: The paranasal sinuses are clear. The mastoid air cells also appear clear. Limited visualization of the orbits is unremarkable. Imaged intracranial contents appear normal.  IMPRESSION: Negative exam.   Electronically Signed   By: Inge Rise M.D.   On: 08/06/2014 11:06    ASSESSMENT / PLAN:  Dyspnea >> she has hx of asthma, and vocal cord dysfunction. Plan: - continue pulmicort - add brovana - change xopenex to prn - d/c atrovent - d/c  solumedrol and change to prednisone  Upper respiratory infection Chronic sinusitis >> CT sinus clear. Plan: - day 2 of levaquin per primary team - flonase, nasal saline  GERD possibly contributing to cough. Plan: - continue protonix  HTN. Plan: - f/u Echo - BP meds per primary team  Okay to transfer out of SDU.  Chesley Mires, MD [facility] Pulmonary/Critical Care 08/07/2014, 11:10 AM Pager:  [phone] After 3pm call: [phone]

## 2014-08-08 DIAGNOSIS — J9600 Acute respiratory failure, unspecified whether with hypoxia or hypercapnia: Secondary | ICD-10-CM | POA: Insufficient documentation

## 2014-08-08 LAB — GLUCOSE, CAPILLARY
GLUCOSE-CAPILLARY: 144 mg/dL — AB (ref 70–99)
Glucose-Capillary: 137 mg/dL — ABNORMAL HIGH (ref 70–99)

## 2014-08-08 MED ORDER — TRAMADOL HCL 50 MG PO TABS
50.0000 mg | ORAL_TABLET | Freq: Four times a day (QID) | ORAL | Status: DC
  Administered 2014-08-08: 50 mg via ORAL
  Filled 2014-08-08: qty 1

## 2014-08-08 MED ORDER — ARFORMOTEROL TARTRATE 15 MCG/2ML IN NEBU: 15.0000 ug | INHALATION_SOLUTION | Freq: Two times a day (BID) | RESPIRATORY_TRACT | Status: DC

## 2014-08-08 MED ORDER — MONTELUKAST SODIUM 10 MG PO TABS: 10.0000 mg | ORAL_TABLET | Freq: Every day | ORAL | Status: DC

## 2014-08-08 MED ORDER — PREDNISONE 5 MG PO TABS: ORAL_TABLET | ORAL | Status: DC

## 2014-08-08 MED ORDER — HYDRALAZINE HCL 50 MG PO TABS: 50.0000 mg | ORAL_TABLET | Freq: Three times a day (TID) | ORAL | Status: DC

## 2014-08-08 MED ORDER — ALUM & MAG HYDROXIDE-SIMETH 200-200-20 MG/5ML PO SUSP
15.0000 mL | Freq: Four times a day (QID) | ORAL | Status: DC | PRN
  Administered 2014-08-08: 15 mL via ORAL
  Filled 2014-08-08: qty 30

## 2014-08-08 MED ORDER — ALBUTEROL SULFATE (2.5 MG/3ML) 0.083% IN NEBU: 2.5000 mg | INHALATION_SOLUTION | RESPIRATORY_TRACT | Status: DC | PRN

## 2014-08-08 MED ORDER — PANTOPRAZOLE SODIUM 40 MG PO TBEC
40.0000 mg | DELAYED_RELEASE_TABLET | Freq: Two times a day (BID) | ORAL | Status: DC
  Administered 2014-08-08: 40 mg via ORAL

## 2014-08-08 MED ORDER — LEVOFLOXACIN 750 MG PO TABS: 750.0000 mg | ORAL_TABLET | Freq: Every day | ORAL | Status: DC

## 2014-08-08 MED ORDER — FAMOTIDINE 20 MG PO TABS
20.0000 mg | ORAL_TABLET | Freq: Every day | ORAL | Status: DC
  Filled 2014-08-08: qty 1

## 2014-08-08 NOTE — Discharge Instructions (Signed)
° ° ° °  [name] was admitted to the Hospital on 08/06/2014 and Discharged  08/08/2014 and should be excused from work/school   for 7  days starting 08/06/2014 , may return to work/school without any restrictions.  Call [name] MD, [facility]  [phone] with questions.  [name] M.D on 08/08/2014,at 8:00 AM  [facility]   Office  [phone]   Follow with Primary MD [name], MD in 7 days   Get CBC, CMP, 2 view Chest X ray checked  by Primary MD next visit.    Activity: As tolerated with Full fall precautions use walker/cane & assistance as needed   Disposition Home     Diet: Heart Healthy Low Carb  For Heart failure patients - Check your Weight same time everyday, if you gain over 2 pounds, or you develop in leg swelling, experience more shortness of breath or chest pain, call your Primary MD immediately. Follow Cardiac Low Salt Diet and 1.5 lit/day fluid restriction.   On your next visit with your primary care physician please Get Medicines reviewed and adjusted.   Please request your Prim.MD to go over all Hospital Tests and Procedure/Radiological results at the follow up, please get all Hospital records sent to your Prim MD by signing hospital release before you go home.   If you experience worsening of your admission symptoms, develop shortness of breath, life threatening emergency, suicidal or homicidal thoughts you must seek medical attention immediately by calling 911 or calling your MD immediately  if symptoms less severe.  You Must read complete instructions/literature along with all the possible adverse reactions/side effects for all the Medicines you take and that have been prescribed to you. Take any new Medicines after you have completely understood and accpet all the possible adverse reactions/side effects.   Do not drive, operating heavy machinery, perform activities at heights,  swimming or participation in water activities or provide baby sitting services if your were admitted for syncope or siezures until you have seen by Primary MD or a Neurologist and advised to do so again.  Do not drive when taking Pain medications.    Do not take more than prescribed Pain, Sleep and Anxiety Medications  Special Instructions: If you have smoked or chewed Tobacco  in the last 2 yrs please stop smoking, stop any regular Alcohol  and or any Recreational drug use.  Wear Seat belts while driving.   Please note  You were cared for by a hospitalist during your hospital stay. If you have any questions about your discharge medications or the care you received while you were in the hospital after you are discharged, you can call the unit and asked to speak with the hospitalist on call if the hospitalist that took care of you is not available. Once you are discharged, your primary care physician will handle any further medical issues. Please note that NO REFILLS for any discharge medications will be authorized once you are discharged, as it is imperative that you return to your primary care physician (or establish a relationship with a primary care physician if you do not have one) for your aftercare needs so that they can reassess your need for medications and monitor your lab values.

## 2014-08-08 NOTE — Progress Notes (Signed)
08/08/14 Patient being discharged home today, IV site removed, and discharge instructions reviewed with patient,

## 2014-08-08 NOTE — Progress Notes (Signed)
08/07/14 Patient ambulated and oxygen level maintained at 94-100% on room air.

## 2014-08-08 NOTE — Progress Notes (Signed)
Walked Pt in hallway. Before walk started, Pt 02 level was 100%. During walk Pt 02 level ranged from 94-100%

## 2014-08-08 NOTE — Discharge Summary (Signed)
[name], is a 64 y.o. female  DOB [birth year]  MRN [id number].  Admission date:  08/06/2014  Admitting Physician  Jacqueline Lose, MD  Discharge Date:  08/08/2014   Primary MD  Jacqueline Frees, MD  Recommendations for primary care physician for things to follow:   Monitor Asthma/Allergy closely  Check CBC, BMP in 7-10 days.    Admission Diagnosis  Shortness of breath [R06.02] Sinusitis [J32.9] Asthma exacerbation [J45.901]   Discharge Diagnosis  Shortness of breath [R06.02] Sinusitis [J32.9] Asthma exacerbation [J45.901]     Principal Problem:   Acute respiratory failure Active Problems:   Essential hypertension   Asthma exacerbation   Diabetes mellitus type 2, controlled   Hyperlipidemia   Sinusitis   Acute respiratory failure, unspecified whether with hypoxia or hypercapnia      Past Medical History  Diagnosis Date  . Asthma   . Diabetes mellitus   . Hypertension   . Dyslipidemia   . Pneumonia   . Bronchitis   . GERD (gastroesophageal reflux disease)     Past Surgical History  Procedure Laterality Date  . Abdominal hysterectomy    . Hernia repair    . Knee arthroscopy    . Hemorrhoid surgery    . Esophagogastroduodenoscopy (egd) with propofol N/A 04/09/2014    Procedure: ESOPHAGOGASTRODUODENOSCOPY (EGD) WITH PROPOFOL;  Surgeon: Cleotis Nipper, MD;  Location: [facility];  Service: Endoscopy;  Laterality: N/A;  . Colonoscopy with propofol N/A 04/09/2014    Procedure: COLONOSCOPY WITH PROPOFOL;  Surgeon: Cleotis Nipper, MD;  Location: [facility];  Service: Endoscopy;  Laterality: N/A;       History of present illness and  Hospital Course:     Kindly see H&P for history of present illness and admission details, please review complete Labs, Consult reports and Test reports  for all details in brief  HPI  from the history and physical done on the day of admission  [name] is a 64 y.o. female with a history of asthma, diabetes mellitus type 2 not presently on any medications, hypertension, hyperlipidemia, former smoker presents to the ER because of worsening shortness of breath. Patient states her symptoms started a week ago when patient's primary care physician placed patient on Zithromax and prednisone tapering dose. Patient had finished the course of antibiotic and prednisone 2 days ago. Last night patient became acutely short of breath. Has been having productive cough denies any fever chills chest pain. On route to the ER and EMS had given patient IV steroids. Patient was given nebulizer and at this time patient is still wheezing and has been admitted as per exacerbation. Chest x-ray shows bronchitis changes. EKG shows sinus tachycardia. Patient otherwise denies any nausea vomiting abdominal pain diarrhea fever chills.  Hospital Course    1. Acute hypoxic respiratory failure secondary to asthma exacerbation. Along with mild URI. Much improved, echocardiogram was stable. Influenza panel was negative. She has responded very well to IV steroids, Levaquin and supportive care. She has been off of  oxygen for over 48 hours. Will be discharged home on steroid taper, Levaquin for a few days, along with nebulizer treatments and inhalers. She will follow with pulmonary outpatient. There is question of her having underlying cough related bronchospasm. For which she would follow with pulmonary as well. Was seen by pulmonary this admission here.   2. Leukocytosis. Likely due to #1 above along with steroid use. Afebrile, chest x-ray nonacute, able UA. Repeat CBC in 7-10 days.   3. Dyslipidemia. Continue home dose statin.   4. GERD. On PPI.   5. Essential hypertension. Blood pressure slightly high on ARB. We added hydralazine and blood pressure is in better  control.   Discharge Condition:  Stable   Follow UP  Follow-up Information    Follow up with Jacqueline Frees, MD. Schedule an appointment as soon as possible for a visit in 1 week.   Specialty:  Family Medicine   Contact information:   [address] [place] [address] [phone]       Follow up with Jacqueline Gully, MD. Schedule an appointment as soon as possible for a visit in 1 week.   Specialty:  Pulmonary Disease   Why:  Asthma   Contact information:   [address] [place] [address] [phone]         Discharge Instructions  and  Discharge Medications          Discharge Instructions    Discharge instructions    Complete by:  As directed   [name] was admitted to the Hospital on 08/06/2014 and Discharged  08/08/2014 and should be excused from work/school   for 7  days starting 08/06/2014 , may return to work/school without any restrictions.  Call Jacqueline Lund MD, [facility]  [phone] with questions.  [name] M.D on 08/08/2014,at 8:00 AM  [facility]   Office  [phone]   Follow with Primary MD Jacqueline Frees, MD in 7 days   Get CBC, CMP, 2 view Chest X ray checked  by Primary MD next visit.    Activity: As tolerated with Full fall precautions use walker/cane & assistance as needed   Disposition Home     Diet: Heart Healthy Low Carb  For Heart failure patients - Check your Weight same time everyday, if you gain over 2 pounds, or you develop in leg swelling, experience more shortness of breath or chest pain, call your Primary MD immediately. Follow Cardiac Low Salt Diet and 1.5 lit/day fluid restriction.   On your next visit with your primary care physician please Get Medicines reviewed and adjusted.   Please request your Prim.MD to go over all Hospital Tests and Procedure/Radiological results at the follow up, please get all Hospital records sent to your Prim MD by signing hospital release  before you go home.   If you experience worsening of your admission symptoms, develop shortness of breath, life threatening emergency, suicidal or homicidal thoughts you must seek medical attention immediately by calling 911 or calling your MD immediately  if symptoms less severe.  You Must read complete instructions/literature along with all the possible adverse reactions/side effects for all the Medicines you take and that have been prescribed to you. Take any new Medicines after you have completely understood and accpet all the possible adverse reactions/side effects.   Do not drive, operating heavy machinery, perform activities at heights, swimming or participation in water activities or provide baby sitting services if your were admitted for syncope or siezures  until you have seen by Primary MD or a Neurologist and advised to do so again.  Do not drive when taking Pain medications.    Do not take more than prescribed Pain, Sleep and Anxiety Medications  Special Instructions: If you have smoked or chewed Tobacco  in the last 2 yrs please stop smoking, stop any regular Alcohol  and or any Recreational drug use.  Wear Seat belts while driving.   Please note  You were cared for by a hospitalist during your hospital stay. If you have any questions about your discharge medications or the care you received while you were in the hospital after you are discharged, you can call the unit and asked to speak with the hospitalist on call if the hospitalist that took care of you is not available. Once you are discharged, your primary care physician will handle any further medical issues. Please note that NO REFILLS for any discharge medications will be authorized once you are discharged, as it is imperative that you return to your primary care physician (or establish a relationship with a primary care physician if you do not have one) for your aftercare needs so that they can reassess your need for  medications and monitor your lab values.     Increase activity slowly    Complete by:  As directed             Medication List    STOP taking these medications        azithromycin 250 MG tablet  Commonly known as:  ZITHROMAX     predniSONE 50 MG tablet  Commonly known as:  DELTASONE  Replaced by:  predniSONE 5 MG tablet      TAKE these medications        albuterol 108 (90 BASE) MCG/ACT inhaler  Commonly known as:  PROVENTIL HFA;VENTOLIN HFA  Inhale 2 puffs into the lungs every 6 (six) hours as needed for wheezing or shortness of breath.     albuterol (2.5 MG/3ML) 0.083% nebulizer solution  Commonly known as:  PROVENTIL  Take 3 mLs (2.5 mg total) by nebulization every 4 (four) hours as needed for shortness of breath.     ALPRAZolam 0.5 MG tablet  Commonly known as:  XANAX  Take 0.5 mg by mouth 2 (two) times daily as needed for anxiety.     arformoterol 15 MCG/2ML Nebu  Commonly known as:  BROVANA  Take 2 mLs (15 mcg total) by nebulization 2 (two) times daily.     aspirin EC 81 MG tablet  Take 81 mg by mouth every morning.     atorvastatin 40 MG tablet  Commonly known as:  LIPITOR  Take 40 mg by mouth every morning.     esomeprazole 40 MG capsule  Commonly known as:  NEXIUM  Take 40 mg by mouth daily before breakfast.     estradiol 1 MG tablet  Commonly known as:  ESTRACE  Take 1 mg by mouth every morning.     fluticasone 50 MCG/ACT nasal spray  Commonly known as:  FLONASE  Place 2 sprays into both nostrils daily.     hydrALAZINE 50 MG tablet  Commonly known as:  APRESOLINE  Take 1 tablet (50 mg total) by mouth every 8 (eight) hours.     levofloxacin 750 MG tablet  Commonly known as:  LEVAQUIN  Take 1 tablet (750 mg total) by mouth daily.     montelukast 10 MG tablet  Commonly known as:  SINGULAIR  Take 1 tablet (10 mg total) by mouth at bedtime.     potassium chloride SA 20 MEQ tablet  Commonly known as:  K-DUR,KLOR-CON  Take 20 mEq by mouth every  morning.     predniSONE 5 MG tablet  Commonly known as:  DELTASONE  Label  & dispense according to the schedule below. 10 Pills PO for 3 days then, 8 Pills PO for 3 days, 6 Pills PO for 3 days, 4 Pills PO for 3 days, 2 Pills PO for 3 days, 1 Pills PO for 3 days, 1/2 Pill  PO for 3 days then STOP. Total 95 pills.     SYSTANE OP  Place 1 drop into both eyes every morning.     valsartan-hydrochlorothiazide 320-25 MG per tablet  Commonly known as:  DIOVAN-HCT  Take 1 tablet by mouth every morning.     Vitamin D3 5000 UNITS Tabs  Take 1 tablet by mouth every morning.          Diet and Activity recommendation: See Discharge Instructions above   Consults obtained - Pulmonary   Major procedures and Radiology Reports - PLEASE review detailed and final reports for all details, in brief -    TTE  - Left ventricle: The cavity size was normal. There was mild focalbasal hypertrophy of the septum. Systolic function was normal. The estimated ejection fraction was in the range of 60% to 65%. Wall motion was normal; there were no regional wall motionabnormalities. Doppler parameters are consistent with abnormal left ventricular relaxation (grade 1 diastolic dysfunction).  Impressions: Normal LV function; grade 1 diastolic dysfunction, trace MR.   Dg Chest Port 1 View  08/06/2014   CLINICAL DATA:  Shortness of breath, history of asthma and bronchitis, pneumonia.  EXAM: PORTABLE CHEST - 1 VIEW  COMPARISON:  Chest radiograph to February 09, 2014  FINDINGS: Cardiomediastinal silhouette is unremarkable. Mild bronchitic changes. The lungs are clear without pleural effusions or focal consolidations. Trachea projects midline and there is no pneumothorax. Soft tissue planes and included osseous structures are non-suspicious.  IMPRESSION: Mild bronchitic changes without focal consolidation.   Electronically Signed   By: Elon Alas   On: 08/06/2014 05:34   [place] Cm  08/06/2014    CLINICAL DATA:  History of sinus infections.  Congestion.  EXAM: CT PARANASAL SINUS LIMITED WITHOUT CONTRAST  TECHNIQUE: Non-contiguous multidetector CT images of the paranasal sinuses were obtained in a single plane without contrast.  COMPARISON:  Sinus CT scan 07/07/2008.  FINDINGS: The paranasal sinuses are clear. The mastoid air cells also appear clear. Limited visualization of the orbits is unremarkable. Imaged intracranial contents appear normal.  IMPRESSION: Negative exam.   Electronically Signed   By: Inge Rise M.D.   On: 08/06/2014 11:06    Micro Results      Recent Results (from the past 240 hour(s))  MRSA PCR Screening     Status: None   Collection Time: 08/06/14  9:00 PM  Result Value Ref Range Status   MRSA by PCR NEGATIVE NEGATIVE Final    Comment:        The GeneXpert MRSA Assay (FDA approved for NASAL specimens only), is one component of a comprehensive MRSA colonization surveillance program. It is not intended to diagnose MRSA infection nor to guide or monitor treatment for MRSA infections.        Today   Subjective:   [name] today has no headache,no chest abdominal pain,no new weakness tingling or numbness,  feels much better wants to go home today.   Objective:   Blood pressure 136/51, pulse 73, temperature 98.4 F (36.9 C), temperature source Oral, resp. rate 18, height 5\' 2"  (1.575 m), weight 73.619 kg (162 lb 4.8 oz), SpO2 100 %.   Intake/Output Summary (Last 24 hours) at 08/08/14 1119 Last data filed at 08/08/14 0800  Gross per 24 hour  Intake    540 ml  Output      0 ml  Net    540 ml    Exam Awake Alert, Oriented x 3, No new F.N deficits, Normal affect [place].AT,PERRAL Supple Neck,No JVD, No cervical lymphadenopathy appriciated.  Symmetrical Chest wall movement, Good air movement bilaterally, CTAB, minimal to no wheezing RRR,No Gallops,Rubs or new Murmurs, No Parasternal Heave +ve B.Sounds, Abd Soft, Non tender, No organomegaly  appriciated, No rebound -guarding or rigidity. No Cyanosis, Clubbing or edema, No new Rash or bruise  Data Review   CBC w Diff:  Lab Results  Component Value Date   WBC 18.0* 08/07/2014   HGB 11.8* 08/07/2014   HCT 35.5* 08/07/2014   PLT 322 08/07/2014   LYMPHOPCT 6* 08/06/2014   BANDSPCT 0 08/09/2011   MONOPCT 2* 08/06/2014   EOSPCT 0 08/06/2014   BASOPCT 0 08/06/2014    CMP:  Lab Results  Component Value Date   NA 135 08/07/2014   K 3.5 08/07/2014   CL 98 08/07/2014   CO2 25 08/07/2014   BUN 25* 08/07/2014   CREATININE 0.95 08/07/2014   PROT 6.8 08/06/2014   ALBUMIN 3.7 08/06/2014   BILITOT 0.4 08/06/2014   ALKPHOS 58 08/06/2014   AST 27 08/06/2014   ALT 27 08/06/2014  .   Total Time in preparing paper work, data evaluation and todays exam - 35 minutes  [name] M.D on 08/08/2014 at 11:19 AM  [facility]   Office  [phone]

## 2014-08-08 NOTE — Progress Notes (Signed)
Name: [name] MRN: [id number] DOB: Jun 18, 1950    ADMISSION DATE:  08/06/2014 CONSULTATION DATE:  08/06/2014  REFERRING MD :  Nichola Sizer  CHIEF COMPLAINT:  SOB/chest tightness  BRIEF PATIENT DESCRIPTION:  64 yo female former smoker presented with sudden onset of dyspnea while asleep.  She had sinus congestion and cough for 2 weeks >> tx with zithromax and prednisone as outpt.  She has hx of asthma.  STUDIES: CT sinus 4/07 >> negative exam Echo 4/8 >  GI dias dys/trace MR  SUBJECTIVE:  Congested sounding cough with prominent pseudowheeze.  VITAL SIGNS: Temp:  [97.8 F (36.6 C)-98.3 F (36.8 C)] 98.2 F (36.8 C) (04/09 0634) Pulse Rate:  [71-90] 71 (04/09 0634) Resp:  [16-22] 16 (04/09 0634) BP: (158-176)/(67-86) 158/86 mmHg (04/09 0634) SpO2:  [92 %-100 %] 100 % (04/09 0634) Weight:  [162 lb 4.8 oz (73.619 kg)] 162 lb 4.8 oz (73.619 kg) (04/09 0634) FIO2  = RA   PHYSICAL EXAMINATION: General: no distress Neuro: normal strength HEENT: no stridor Cardiovascular: regular  Lungs: no wheeze/ prominent pseudowheeze Abdomen:  Soft, nontender Musculoskeletal: no edema Skin: no rashes   CMP Latest Ref Rng 08/07/2014 08/06/2014 08/06/2014  Glucose 70 - 99 mg/dL 194(H) 229(H) 152(H)  BUN 6 - 23 mg/dL 25(H) 22 24(H)  Creatinine 0.50 - 1.10 mg/dL 0.95 1.05 1.02  Sodium 135 - 145 mmol/L 135 135 139  Potassium 3.5 - 5.1 mmol/L 3.5 3.3(L) 3.5  Chloride 96 - 112 mmol/L 98 98 102  CO2 19 - 32 mmol/L 25 23 25   Calcium 8.4 - 10.5 mg/dL 9.4 9.1 9.3  Total Protein 6.0 - 8.3 g/dL - 6.8 -  Total Bilirubin 0.3 - 1.2 mg/dL - 0.4 -  Alkaline Phos 39 - 117 U/L - 58 -  AST 0 - 37 U/L - 27 -  ALT 0 - 35 U/L - 27 -    CBC Latest Ref Rng 08/07/2014 08/06/2014 08/06/2014  WBC 4.0 - 10.5 K/uL 18.0(H) 13.3(H) 11.2(H)  Hemoglobin 12.0 - 15.0 g/dL 11.8(L) 12.7 12.8  Hematocrit 36.0 - 46.0 % 35.5(L) 38.4 38.4  Platelets 150 - 400 K/uL 322 315 [place] Cm  08/06/2014    CLINICAL DATA:  History of sinus infections.  Congestion.  EXAM: CT PARANASAL SINUS LIMITED WITHOUT CONTRAST  TECHNIQUE: Non-contiguous multidetector CT images of the paranasal sinuses were obtained in a single plane without contrast.  COMPARISON:  Sinus CT scan 07/07/2008.  FINDINGS: The paranasal sinuses are clear. The mastoid air cells also appear clear. Limited visualization of the orbits is unremarkable. Imaged intracranial contents appear normal.  IMPRESSION: Negative exam.   Electronically Signed   By: Inge Rise M.D.   On: 08/06/2014 11:06    ASSESSMENT / PLAN:  Dyspnea >> she has hx of ?asthma vs  vocal cord dysfunction. Plan: - continue pulmicort/ brovana - change xopenex to prn - d/c atrovent - continue prednisone  Upper respiratory infection Chronic sinusitis >> CT sinus clear. Plan: - levaquin since 4/7  per primary team - flonase, nasal saline - added flutter valve 4/9   GERD possibly contributing to cough. Plan: - continue max rx  HTN. 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   These are the same pts (now being labeled as having "irritable larynx syndrome" by some cough centers) who not infrequently have a history of having failed to tolerate ace inhibitors,  dry powder inhalers or biphosphonates or report having atypical reflux symptoms that don't respond to standard doses of PPI , and are easily confused as having aecopd or asthma flares by even experienced allergists/  pulmonologists.   Need to keep her from clearing her throat excessively > rec sugarless candy or ice chips for his purpose    Christinia Gully, MD Pulmonary and [place] [phone] After 5:30 PM or weekends, call [phone]

## 2014-08-11 LAB — URINE CULTURE: Colony Count: 3000

## 2014-08-27 ENCOUNTER — Encounter: Payer: Self-pay | Admitting: Internal Medicine

## 2014-08-27 ENCOUNTER — Encounter: Payer: Self-pay | Admitting: *Deleted

## 2014-08-27 ENCOUNTER — Ambulatory Visit (INDEPENDENT_AMBULATORY_CARE_PROVIDER_SITE_OTHER): Payer: Medicare Other | Admitting: Internal Medicine

## 2014-08-27 VITALS — BP 136/84 | HR 72 | Ht 62.0 in | Wt 156.8 lb

## 2014-08-27 DIAGNOSIS — R058 Other specified cough: Secondary | ICD-10-CM

## 2014-08-27 DIAGNOSIS — J4530 Mild persistent asthma, uncomplicated: Secondary | ICD-10-CM | POA: Diagnosis not present

## 2014-08-27 DIAGNOSIS — R05 Cough: Secondary | ICD-10-CM | POA: Diagnosis not present

## 2014-08-27 MED ORDER — MOMETASONE FURO-FORMOTEROL FUM 100-5 MCG/ACT IN AERO: INHALATION_SPRAY | RESPIRATORY_TRACT | Status: DC

## 2014-08-27 MED ORDER — FAMOTIDINE 20 MG PO TABS: ORAL_TABLET | ORAL | Status: DC

## 2014-08-27 NOTE — Patient Instructions (Addendum)
At very onset of cough/ wheeze > dulera 100 Take 2 puffs first thing in am and then another 2 puffs about 12 hours later.   Plan A = Automatic = continue to suppress acid by nexium Take 30-60 min before first meal of the day and Pepcid ac 20 mg at bedtime  Plan B= Back Only use your albuterol as a rescue medication to be used if you can't catch your breath by resting or doing a relaxed purse lip breathing pattern.  - The less you use it, the better it will work when you need it. - Ok to use up to 2 puffs  every 4 hours if you must but call for immediate appointment if use goes up over your usual need - Don't leave home without it !!  (think of it like the spare tire for your car)   Plan C = crisis  Only use the nebulizer if you try plan B first and it doesn't work   Please schedule a follow up office visit in 4 weeks, sooner if needed

## 2014-08-27 NOTE — Progress Notes (Signed)
Subjective:    Patient ID: [name], female    DOB: July 21, 1950,   MRN: [id number]  HPI  59 yobf quit smoking 2005 with nl pfts 2012  with recurrent non-specific resp flares which had improved until one week pta p came down with a sinus infection "just like always"   Admission date: 08/06/2014    Discharge Date: 08/08/2014   Admission Diagnosis Shortness of breath [R06.02] Sinusitis [J32.9] Asthma exacerbation [J45.901]  Discharge Diagnosis Shortness of breath [R06.02] Sinusitis [J32.9] Asthma exacerbation [J45.901]   Acute respiratory failure   Essential hypertension  Asthma exacerbation  Diabetes mellitus type 2, controlled  Hyperlipidemia  Sinusitis      Past Medical History  Diagnosis Date  . Asthma   . Diabetes mellitus   . Hypertension   . Dyslipidemia   . Pneumonia   . Bronchitis   . GERD (gastroesophageal reflux disease)     Past Surgical History  Procedure Laterality Date  . Abdominal hysterectomy    . Hernia repair    . Knee arthroscopy    . Hemorrhoid surgery    . Esophagogastroduodenoscopy (egd) with propofol N/A 04/09/2014    Procedure: ESOPHAGOGASTRODUODENOSCOPY (EGD) WITH PROPOFOL; Surgeon: Cleotis Nipper, MD; Location: [facility]; Service: Endoscopy; Laterality: N/A;  . Colonoscopy with propofol N/A 04/09/2014    Procedure: COLONOSCOPY WITH PROPOFOL; Surgeon: Cleotis Nipper, MD; Location: [facility]; Service: Endoscopy; Laterality: N/A;          HPI from the history and physical done on the day of admission  [name] is a 64 y.o. female with a history of asthma, diabetes mellitus type 2 not presently on any medications, hypertension, hyperlipidemia, former smoker presents to the ER because of worsening shortness of breath. Patient states her symptoms started a week ago when patient's primary care physician placed patient on Zithromax and  prednisone tapering dose. Patient had finished the course of antibiotic and prednisone 2 days ago. Last night patient became acutely short of breath. Has been having productive cough denies any fever chills chest pain. On route to the ER and EMS had given patient IV steroids. Patient was given nebulizer and at this time patient is still wheezing and has been admitted as per exacerbation. Chest x-ray shows bronchitis changes. EKG shows sinus tachycardia. Patient otherwise denies any nausea vomiting abdominal pain diarrhea fever chills.  Hospital Course    1. Acute hypoxic respiratory failure secondary to asthma exacerbation. Along with mild URI. Much improved, echocardiogram was stable. Influenza panel was negative. She has responded very well to IV steroids, Levaquin and supportive care. She has been off of oxygen for over 48 hours. Will be discharged home on steroid taper, Levaquin for a few days, along with nebulizer treatments and inhalers. She will follow with pulmonary outpatient. There is question of her having underlying cough related bronchospasm. For which she would follow with pulmonary as well. Was seen by pulmonary this admission here.   2. Leukocytosis. Likely due to #1 above along with steroid use. Afebrile, chest x-ray nonacute, able UA. Repeat CBC in 7-10 days.   3. Dyslipidemia. Continue home dose statin.   4. GERD. On PPI.   5. Essential hypertension. Blood pressure slightly high on ARB. We added hydralazine and blood pressure is in better control.   Discharge Condition: Stable      08/27/2014 hosp f/u ov/[name] re: asthma vs vcd - was not on any controlling meds prior to admit and none at first ov  Chief Complaint  Patient presents with  . Pulmonary Consult    Self referral.  Recently admitted to [facility] from 08/06/14 to 08/08/14 with sinusitis and asthma flare. She states that she has had asthma all of her life. She states breathing is back to her normal baseline  since hospital d/c.  She c/o PND and cough- non prod. She is using albuterol inhaler 2 x per wk on average and hsa not needed neb.   Off pred/ using peppermints and just one daily nexium/ off pred s flare so far  No obvious day to day or daytime variabilty or assoc chronic cough or cp or chest tightness, subjective wheeze overt sinus or hb symptoms. No unusual exp hx or h/o childhood pna/ asthma or knowledge of premature birth.  Sleeping ok without nocturnal  or early am exacerbation  of respiratory  c/o's or need for noct saba. Also denies any obvious fluctuation of symptoms with weather or environmental changes or other aggravating or alleviating factors except as outlined above   Current Medications, Allergies, Complete Past Medical History, Past Surgical History, Family History, and Social History were reviewed in [employer] record.         Review of Systems  Constitutional: Negative for fever, chills and unexpected weight change.  HENT: Positive for postnasal drip. Negative for congestion, dental problem, ear pain, nosebleeds, rhinorrhea, sinus pressure, sneezing, sore throat, trouble swallowing and voice change.   Eyes: Negative for visual disturbance.  Respiratory: Positive for cough. Negative for choking and shortness of breath.   Cardiovascular: Negative for chest pain and leg swelling.  Gastrointestinal: Negative for vomiting, abdominal pain and diarrhea.  Genitourinary: Negative for difficulty urinating.  Musculoskeletal: Negative for arthralgias.  Skin: Negative for rash.  Neurological: Negative for tremors, syncope and headaches.  Hematological: Does not bruise/bleed easily.       Objective:   Physical Exam  amb bf nad   Wt Readings from Last 3 Encounters:  08/27/14 156 lb 12.8 oz (71.124 kg)  08/08/14 162 lb 4.8 oz (73.619 kg)  04/09/14 155 lb (70.308 kg)    Vital signs reviewed  HEENT: nl dentition, turbinates, and orophanx. Nl external  ear canals without cough reflex   NECK :  without JVD/Nodes/TM/ nl carotid upstrokes bilaterally   LUNGS: no acc muscle use, clear to A and P bilaterally without cough on insp or exp maneuvers   CV:  RRR  no s3 or murmur or increase in P2, no edema   ABD:  soft and nontender with nl excursion in the supine position. No bruits or organomegaly, bowel sounds nl  MS:  warm without deformities, calf tenderness, cyanosis or clubbing  SKIN: warm and dry without lesions    NEURO:  alert, approp, no deficits          Assessment & Plan:

## 2014-08-28 ENCOUNTER — Encounter: Payer: Self-pay | Admitting: Internal Medicine

## 2014-08-28 DIAGNOSIS — R05 Cough: Secondary | ICD-10-CM | POA: Insufficient documentation

## 2014-08-28 DIAGNOSIS — R058 Other specified cough: Secondary | ICD-10-CM | POA: Insufficient documentation

## 2014-08-28 NOTE — Assessment & Plan Note (Signed)
08/27/2014 p extensive coaching HFA effectiveness =    75% > try dulera 100 2bid "prn" flare    See comments under uacs

## 2014-08-28 NOTE — Assessment & Plan Note (Addendum)
-   CT sinus 08/06/14 > neg   Note that it's not clear she ever had "resp failure" despite "resp distress"  With p02 nearly 200 on 2lpm  I strongly favor vcd from [facility] here.   Classic Upper airway cough syndrome, so named because it's frequently impossible to sort out how much is  CR/sinusitis with freq throat clearing (which can be related to primary GERD)   vs  causing  secondary (" extra esophageal")  GERD from wide swings in gastric pressure that occur with throat clearing, often  promoting self use of mint and menthol lozenges that reduce the lower esophageal sphincter tone and exacerbate the problem further in a cyclical fashion.   These are the same pts (now being labeled as having "irritable larynx syndrome" by some cough centers) who not infrequently have a history of having failed to tolerate ace inhibitors,  dry powder inhalers or biphosphonates or report having atypical reflux symptoms that don't respond to standard doses of PPI , and are easily confused as having aecopd or asthma flares by even experienced allergists/ pulmonologists.   She could still have intermittent asthma and if so best choice for flare is dulera 100 or symbicoret 80 to start on day one of any exac and in meantime focus much harder on controlling upper airway inflammation/ cyclical coughing and throat clearing    I had an extended discussion with the patient reviewing all relevant studies completed to date and  lasting  25 min re dx and rx   Each maintenance medication was reviewed in detail including most importantly the difference between maintenance and as needed and under what circumstances the prns are to be used.  Please see instructions for details which were reviewed in writing and the patient given a copy.

## 2014-09-24 ENCOUNTER — Ambulatory Visit: Payer: Medicare Other | Admitting: Internal Medicine

## 2014-10-05 ENCOUNTER — Ambulatory Visit: Payer: Medicare Other | Admitting: Internal Medicine

## 2014-10-26 ENCOUNTER — Other Ambulatory Visit: Payer: Self-pay

## 2015-02-19 ENCOUNTER — Emergency Department: Payer: Worker's Compensation

## 2015-02-19 ENCOUNTER — Emergency Department
Admission: EM | Admit: 2015-02-19 | Discharge: 2015-02-19 | Disposition: A | Discharge status: Home / Self Care | Payer: Worker's Compensation | Attending: Emergency Medicine | Admitting: Emergency Medicine

## 2015-02-19 ENCOUNTER — Encounter: Payer: Self-pay | Admitting: Adult Health

## 2015-02-19 DIAGNOSIS — Y9389 Activity, other specified: Secondary | ICD-10-CM | POA: Diagnosis not present

## 2015-02-19 DIAGNOSIS — M25461 Effusion, right knee: Secondary | ICD-10-CM

## 2015-02-19 DIAGNOSIS — E1140 Type 2 diabetes mellitus with diabetic neuropathy, unspecified: Secondary | ICD-10-CM | POA: Diagnosis not present

## 2015-02-19 DIAGNOSIS — Y990 Civilian activity done for income or pay: Secondary | ICD-10-CM | POA: Diagnosis not present

## 2015-02-19 DIAGNOSIS — Z79890 Hormone replacement therapy (postmenopausal): Secondary | ICD-10-CM | POA: Diagnosis not present

## 2015-02-19 DIAGNOSIS — Z7982 Long term (current) use of aspirin: Secondary | ICD-10-CM | POA: Diagnosis not present

## 2015-02-19 DIAGNOSIS — S8991XA Unspecified injury of right lower leg, initial encounter: Secondary | ICD-10-CM | POA: Diagnosis present

## 2015-02-19 DIAGNOSIS — Z79899 Other long term (current) drug therapy: Secondary | ICD-10-CM | POA: Insufficient documentation

## 2015-02-19 DIAGNOSIS — I10 Essential (primary) hypertension: Secondary | ICD-10-CM | POA: Insufficient documentation

## 2015-02-19 DIAGNOSIS — Z87891 Personal history of nicotine dependence: Secondary | ICD-10-CM | POA: Diagnosis not present

## 2015-02-19 DIAGNOSIS — X58XXXA Exposure to other specified factors, initial encounter: Secondary | ICD-10-CM | POA: Diagnosis not present

## 2015-02-19 DIAGNOSIS — Y9263 Factory as the place of occurrence of the external cause: Secondary | ICD-10-CM | POA: Insufficient documentation

## 2015-02-19 DIAGNOSIS — S86911A Strain of unspecified muscle(s) and tendon(s) at lower leg level, right leg, initial encounter: Secondary | ICD-10-CM | POA: Diagnosis not present

## 2015-02-19 DIAGNOSIS — Z7951 Long term (current) use of inhaled steroids: Secondary | ICD-10-CM | POA: Insufficient documentation

## 2015-02-19 DIAGNOSIS — T1490XA Injury, unspecified, initial encounter: Secondary | ICD-10-CM

## 2015-02-19 MED ORDER — NAPROXEN 500 MG PO TBEC: 500.0000 mg | DELAYED_RELEASE_TABLET | Freq: Two times a day (BID) | ORAL | Status: DC

## 2015-02-19 MED ORDER — TRAMADOL HCL 50 MG PO TABS: 50.0000 mg | ORAL_TABLET | Freq: Two times a day (BID) | ORAL | Status: DC

## 2015-02-19 NOTE — ED Notes (Signed)
Pt reports injury to right knee at work while pulling carts.    Pt wants to file WC, per pt, regional loss prevention stated UDS not required.  Profile not yet pulled for [employer].  Per pt, go ahead and look at profile and do as profile prescribes.  Possible consequences of profile not being up to date with workplace discussed with pt.  Pt verbalized understanding.

## 2015-02-19 NOTE — ED Provider Notes (Signed)
[facility] Emergency Department Provider Note ____________________________________________  Time seen: 2112  I have reviewed the triage vital signs and the nursing notes.  HISTORY  Chief Complaint  Knee Injury  HPI [name] is a 64 y.o. female reports to the ED for evaluation of injury sustained to her right knee while at work. She describes she works for [employer], and that she was pushing shopping carts to the store, she tweaked her right knee and she twisted it on a planted right foot trying to turn the collection of shopping carts. She describes immediate pain to the right knee as well as immediate disability. She is unable to walk or bend the knee at the time of the accident. She reports to the ED with her husband, for treatment of her injury sustained today. She does admit to apply some ice to the knee for comfort.She rates her knee pain at a 10/10 in triage. She does give a history of a right knee arthroscopy for a medial meniscus tear back in 2013.  Past Medical History  Diagnosis Date  . Asthma   . Diabetes mellitus   . Hypertension   . Dyslipidemia   . Pneumonia   . Bronchitis   . GERD (gastroesophageal reflux disease)     Patient Active Problem List   Diagnosis Date Noted  . Upper airway cough syndrome vs Asthma  08/28/2014  . Diabetes mellitus type 2, controlled ([place]) 08/06/2014  . Hyperlipidemia 08/06/2014  . ALLERGIC RHINITIS 07/22/2008  . 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 DYSPNEA 07/22/2008  . DIABETES MELLITUS, TYPE II 07/21/2008  . DIABETIC PERIPHERAL NEUROPATHY 07/21/2008  . HYPERCHOLESTEROLEMIA 07/21/2008  . DEPRESSION 07/21/2008  . Essential hypertension 07/21/2008  . Mild persistent chronic asthma without complication 89/21/1941  . G E R D 07/21/2008  . PULMONARY EMBOLISM, HX OF 07/21/2008    Past Surgical History  Procedure Laterality Date  . Abdominal hysterectomy    . Hernia repair    . Knee  arthroscopy    . Hemorrhoid surgery    . Esophagogastroduodenoscopy (egd) with propofol N/A 04/09/2014    Procedure: ESOPHAGOGASTRODUODENOSCOPY (EGD) WITH PROPOFOL;  Surgeon: Cleotis Nipper, MD;  Location: [facility];  Service: Endoscopy;  Laterality: N/A;  . Colonoscopy with propofol N/A 04/09/2014    Procedure: COLONOSCOPY WITH PROPOFOL;  Surgeon: Cleotis Nipper, MD;  Location: [facility];  Service: Endoscopy;  Laterality: N/A;    Current Outpatient Rx  Name  Route  Sig  Dispense  Refill  . albuterol (PROVENTIL HFA;VENTOLIN HFA) 108 (90 BASE) MCG/ACT inhaler   Inhalation   Inhale 2 puffs into the lungs every 6 (six) hours as needed for wheezing or shortness of breath.   1 Inhaler   2   . albuterol (PROVENTIL) (2.5 MG/3ML) 0.083% nebulizer solution   Nebulization   Take 3 mLs (2.5 mg total) by nebulization every 4 (four) hours as needed for shortness of breath.   75 mL   12   . ALPRAZolam (XANAX) 0.5 MG tablet   Oral   Take 0.5 mg by mouth 2 (two) times daily as needed for anxiety.          Marland Kitchen aspirin EC 81 MG tablet   Oral   Take 81 mg by mouth every morning.          Marland Kitchen atorvastatin (LIPITOR) 40 MG tablet   Oral   Take 40 mg by mouth every morning.          Marland Kitchen  Cholecalciferol (VITAMIN D3) 5000 UNITS TABS   Oral   Take 1 tablet by mouth every morning.         Marland Kitchen esomeprazole (NEXIUM) 40 MG capsule   Oral   Take 40 mg by mouth daily before breakfast.         . estradiol (ESTRACE) 1 MG tablet   Oral   Take 1 mg by mouth every morning.          . famotidine (PEPCID) 20 MG tablet      One at bedtime   30 tablet   2   . fluticasone (FLONASE) 50 MCG/ACT nasal spray   Each Nare   Place 2 sprays into both nostrils daily.   16 g   2   . mometasone-formoterol (DULERA) 100-5 MCG/ACT AERO      Take 2 puffs first thing in am and then another 2 puffs about 12 hours later.   1 Inhaler   11   . naproxen (EC NAPROSYN) 500 MG EC tablet   Oral   Take 1  tablet (500 mg total) by mouth 2 (two) times daily with a meal.   30 tablet   0   . Polyethyl Glycol-Propyl Glycol (SYSTANE OP)   Both Eyes   Place 1 drop into both eyes every morning.         . potassium chloride SA (K-DUR,KLOR-CON) 20 MEQ tablet   Oral   Take 20 mEq by mouth every morning.          . traMADol (ULTRAM) 50 MG tablet   Oral   Take 1 tablet (50 mg total) by mouth 2 (two) times daily.   10 tablet   0   . valsartan-hydrochlorothiazide (DIOVAN-HCT) 320-25 MG per tablet   Oral   Take 1 tablet by mouth every morning.            Allergies Amlodipine besylate; Lisinopril; and Pantoprazole sodium  Family History  Problem Relation Age of Onset  . Diabetes type II    . Hypertension    . Allergies Mother   . Asthma Mother     Social History Social History  Substance Use Topics  . Smoking status: Former Smoker -- 1.00 packs/day for 30 years    Types: Cigarettes    Quit date: 01/09/2004  . Smokeless tobacco: Never Used  . Alcohol Use: No   Review of Systems  Constitutional: Negative for fever. Eyes: Negative for visual changes. ENT: Negative for sore throat. Cardiovascular: Negative for chest pain. Respiratory: Negative for shortness of breath. Gastrointestinal: Negative for abdominal pain, vomiting and diarrhea. Genitourinary: Negative for dysuria. Musculoskeletal: Negative for back pain. Right knee pain. Skin: Negative for rash. Neurological: Negative for headaches, focal weakness or numbness. ____________________________________________  PHYSICAL EXAM:  VITAL SIGNS: ED Triage Vitals  Enc Vitals Group     BP 02/19/15 2015 173/86 mmHg     Pulse Rate 02/19/15 2015 91     Resp 02/19/15 2015 18     Temp 02/19/15 2015 98 F (36.7 C)     Temp Source 02/19/15 2015 Oral     SpO2 02/19/15 2015 100 %     Weight --      Height --      Head Cir --      Peak Flow --      Pain Score 02/19/15 2015 10     Pain Loc --      Pain Edu? --      Excl.  in [place]? --    Constitutional: Alert and oriented. Well appearing and in no distress. Head: Normocephalic and atraumatic.      Eyes: Conjunctivae are normal. PERRL. Normal extraocular movements      Ears: Canals clear. TMs intact bilaterally.   Nose: No congestion/rhinorrhea.   Mouth/Throat: Mucous membranes are moist.   Neck: Supple. No thyromegaly. Hematological/Lymphatic/Immunological: No cervical lymphadenopathy. Cardiovascular: Normal rate, regular rhythm.  Respiratory: Normal respiratory effort. No wheezes/rales/rhonchi. Gastrointestinal: Soft and nontender. No distention. Musculoskeletal: Right knee without obvious deformity, effusion, dislocation. Patient with normal range of motion of the knee. She is without any significant valgus or varus joint laxity. She is tender to palpation at the medial aspect of the joint. She also with some mild popliteal space tenderness on exam. Negative anterior posterior drawer exam. Nontender with normal range of motion in all extremities.  Neurologic:  Normal gait without ataxia. Normal speech and language. No gross focal neurologic deficits are appreciated. Skin:  Skin is warm, dry and intact. No rash noted. Psychiatric: Mood and affect are normal. Patient exhibits appropriate insight and judgment. ____________________________________________   RADIOLOGY Right Knee  IMPRESSION: No acute fracture. Suggestion of a small joint effusion. Mild degenerate change.  I, Zenita Kister, Dannielle Karvonen, personally viewed and evaluated these images (plain radiographs) as part of my medical decision making.  ____________________________________________  PROCEDURES  Ace wrap Knee immobilizer ____________________________________________  INITIAL IMPRESSION / ASSESSMENT AND PLAN / ED COURSE  Right knee strain without radiographic evidence of fracture dislocation. Patient will be discharged with a knee immobilizer for support, and prescriptions for  Ultram and EC Naprosyn to dose as directed. She will follow-up with Dr. Rudene Christians or her company's ortho provider for ongoing care and management. ____________________________________________  FINAL CLINICAL IMPRESSION(S) / ED DIAGNOSES  Final diagnoses:  Knee strain, right, initial encounter  Knee effusion, right      Melvenia Needles, PA-C 02/20/15 0131  Nance Pear, MD 02/26/15 [phone]

## 2015-02-19 NOTE — ED Notes (Signed)
Presents with injury to right knee that occurred while lifting and twisted right knee, mild swelling noted. Cms intact

## 2015-02-19 NOTE — Discharge Instructions (Signed)
Knee Effusion Knee effusion means that you have extra fluid in your knee. This can cause pain. Your knee may be more difficult to bend and move. HOME CARE  Use crutches as told by your doctor.  Wear a knee brace as told by your doctor.  Apply ice to the swollen area:  Put ice in a plastic bag.  Place a towel between your skin and the bag.  Leave the ice on for 20 minutes, 2-3 times per day.  Keep your knee raised (elevated) when you are sitting or lying down.  Take medicines only as told by your doctor.  Do any rehabilitation or strengthening exercises as told by your doctor.  Rest your knee as told by your doctor. You may start doing your normal activities again when your doctor says it is okay.  Keep all follow-up visits as told by your doctor. This is important. GET HELP IF:   You continue to have pain in your knee. GET HELP RIGHT AWAY IF:  You have increased swelling or redness of your knee.  You have severe pain in your knee.  You have a fever.   This information is not intended to replace advice given to you by your health care provider. Make sure you discuss any questions you have with your health care provider.   Document Released: 05/20/2010 Document Revised: 05/08/2014 Document Reviewed: 12/01/2013 Elsevier Interactive Patient Education 2016 [place] the prescription meds as directed. Rest with the leg elevated. Apply ice to reduce pain and swelling. Follow-up with your company's provider or ortho specialists.

## 2015-02-23 ENCOUNTER — Encounter (HOSPITAL_COMMUNITY): Payer: Self-pay | Admitting: Emergency Medicine

## 2015-02-23 ENCOUNTER — Emergency Department (EMERGENCY_DEPARTMENT_HOSPITAL): Payer: Worker's Compensation

## 2015-02-23 ENCOUNTER — Emergency Department (HOSPITAL_COMMUNITY)
Admission: EM | Admit: 2015-02-23 | Discharge: 2015-02-23 | Disposition: A | Discharge status: Home / Self Care | Payer: Worker's Compensation | Attending: Physician Assistant | Admitting: Physician Assistant

## 2015-02-23 DIAGNOSIS — E119 Type 2 diabetes mellitus without complications: Secondary | ICD-10-CM | POA: Insufficient documentation

## 2015-02-23 DIAGNOSIS — Z7982 Long term (current) use of aspirin: Secondary | ICD-10-CM | POA: Diagnosis not present

## 2015-02-23 DIAGNOSIS — Z79899 Other long term (current) drug therapy: Secondary | ICD-10-CM | POA: Insufficient documentation

## 2015-02-23 DIAGNOSIS — Y9389 Activity, other specified: Secondary | ICD-10-CM | POA: Insufficient documentation

## 2015-02-23 DIAGNOSIS — E785 Hyperlipidemia, unspecified: Secondary | ICD-10-CM | POA: Insufficient documentation

## 2015-02-23 DIAGNOSIS — Z87891 Personal history of nicotine dependence: Secondary | ICD-10-CM | POA: Diagnosis not present

## 2015-02-23 DIAGNOSIS — I10 Essential (primary) hypertension: Secondary | ICD-10-CM | POA: Insufficient documentation

## 2015-02-23 DIAGNOSIS — K219 Gastro-esophageal reflux disease without esophagitis: Secondary | ICD-10-CM | POA: Diagnosis not present

## 2015-02-23 DIAGNOSIS — Y9289 Other specified places as the place of occurrence of the external cause: Secondary | ICD-10-CM | POA: Insufficient documentation

## 2015-02-23 DIAGNOSIS — J45909 Unspecified asthma, uncomplicated: Secondary | ICD-10-CM | POA: Diagnosis not present

## 2015-02-23 DIAGNOSIS — Z791 Long term (current) use of non-steroidal anti-inflammatories (NSAID): Secondary | ICD-10-CM | POA: Insufficient documentation

## 2015-02-23 DIAGNOSIS — Z8701 Personal history of pneumonia (recurrent): Secondary | ICD-10-CM | POA: Diagnosis not present

## 2015-02-23 DIAGNOSIS — X58XXXA Exposure to other specified factors, initial encounter: Secondary | ICD-10-CM | POA: Insufficient documentation

## 2015-02-23 DIAGNOSIS — M79609 Pain in unspecified limb: Secondary | ICD-10-CM

## 2015-02-23 DIAGNOSIS — Y998 Other external cause status: Secondary | ICD-10-CM | POA: Diagnosis not present

## 2015-02-23 DIAGNOSIS — Z7951 Long term (current) use of inhaled steroids: Secondary | ICD-10-CM | POA: Diagnosis not present

## 2015-02-23 DIAGNOSIS — S86811A Strain of other muscle(s) and tendon(s) at lower leg level, right leg, initial encounter: Secondary | ICD-10-CM | POA: Diagnosis not present

## 2015-02-23 DIAGNOSIS — S86911A Strain of unspecified muscle(s) and tendon(s) at lower leg level, right leg, initial encounter: Secondary | ICD-10-CM

## 2015-02-23 DIAGNOSIS — S8991XA Unspecified injury of right lower leg, initial encounter: Secondary | ICD-10-CM | POA: Diagnosis present

## 2015-02-23 NOTE — ED Provider Notes (Signed)
CSN: [id number]     Arrival date & time 02/23/15  1746 History   First MD Initiated Contact with Patient 02/23/15 2043     Chief Complaint  Patient presents with  . Knee Pain     (Consider location/radiation/quality/duration/timing/severity/associated sxs/prior Treatment) HPI Comments: Pt comes in with c/o right knee pain that started after twisting her knee at work 5 days ago. She was seen at the orthopedist today and was told she need to come to make sure that she doesn't have a clot. Denies redness or warmth to the area. She states that she has a history of a torn mcl. Ortho told her she may need a mri.  The history is provided by the patient. No language interpreter was used.    Past Medical History  Diagnosis Date  . Asthma   . Diabetes mellitus   . Hypertension   . Dyslipidemia   . Pneumonia   . Bronchitis   . GERD (gastroesophageal reflux disease)    Past Surgical History  Procedure Laterality Date  . Abdominal hysterectomy    . Hernia repair    . Knee arthroscopy    . Hemorrhoid surgery    . Esophagogastroduodenoscopy (egd) with propofol N/A 04/09/2014    Procedure: ESOPHAGOGASTRODUODENOSCOPY (EGD) WITH PROPOFOL;  Surgeon: Cleotis Nipper, MD;  [facility];  Service: Endoscopy;  Laterality: N/A;  . Colonoscopy with propofol N/A 04/09/2014    Procedure: COLONOSCOPY WITH PROPOFOL;  Surgeon: Cleotis Nipper, MD;  Location[facility];  Service: Endoscopy;  Laterality: N/A;   Family History  Problem Relation Age of Onset  . Diabetes type II    . Hypertension    . Allergies Mother   . Asthma Mother    Social History  Substance Use Topics  . Smoking status: Former Smoker -- 1.00 packs/day for 30 years    Types: Cigarettes    Quit date: 01/09/2004  . Smokeless tobacco: Never Used  . Alcohol Use: No   OB History    No data available     Review of Systems  All other systems reviewed and are negative.     Allergies  Amlodipine besylate;  Lisinopril; and Pantoprazole sodium  Home Medications   Prior to Admission medications   Medication Sig Start Date End Date Taking? Authorizing Provider  albuterol (PROVENTIL HFA;VENTOLIN HFA) 108 (90 BASE) MCG/ACT inhaler Inhale 2 puffs into the lungs every 6 (six) hours as needed for wheezing or shortness of breath. 07/30/14   Gregor Hams, MD  albuterol (PROVENTIL) (2.5 MG/3ML) 0.083% nebulizer solution Take 3 mLs (2.5 mg total) by nebulization every 4 (four) hours as needed for shortness of breath. 08/08/14   [name], MD  ALPRAZolam Duanne Moron) 0.5 MG tablet Take 0.5 mg by mouth 2 (two) times daily as needed for anxiety.     Historical Provider, MD  aspirin EC 81 MG tablet Take 81 mg by mouth every morning.     Historical Provider, MD  atorvastatin (LIPITOR) 40 MG tablet Take 40 mg by mouth every morning.     Historical Provider, MD  Cholecalciferol (VITAMIN D3) 5000 UNITS TABS Take 1 tablet by mouth every morning.    Historical Provider, MD  esomeprazole (NEXIUM) 40 MG capsule Take 40 mg by mouth daily before breakfast.    Historical Provider, MD  estradiol (ESTRACE) 1 MG tablet Take 1 mg by mouth every morning.     Historical Provider, MD  famotidine (PEPCID) 20 MG tablet One at bedtime  08/27/14   Tanda Rockers, MD  fluticasone (FLONASE) 50 MCG/ACT nasal spray Place 2 sprays into both nostrils daily. 07/30/14   Gregor Hams, MD  mometasone-formoterol (DULERA) 100-5 MCG/ACT AERO Take 2 puffs first thing in am and then another 2 puffs about 12 hours later. 08/27/14   Tanda Rockers, MD  naproxen (EC NAPROSYN) 500 MG EC tablet Take 1 tablet (500 mg total) by mouth 2 (two) times daily with a meal. 02/19/15   Jenise V Bacon Menshew, PA-C  Polyethyl Glycol-Propyl Glycol (SYSTANE OP) Place 1 drop into both eyes every morning.    Historical Provider, MD  potassium chloride SA (K-DUR,KLOR-CON) 20 MEQ tablet Take 20 mEq by mouth every morning.     Historical Provider, MD  traMADol (ULTRAM) 50 MG  tablet Take 1 tablet (50 mg total) by mouth 2 (two) times daily. 02/19/15   Jenise V Bacon Menshew, PA-C  valsartan-hydrochlorothiazide (DIOVAN-HCT) 320-25 MG per tablet Take 1 tablet by mouth every morning.     Historical Provider, MD   BP 162/73 mmHg  Pulse 88  Temp(Src) 98.2 F (36.8 C) (Oral)  Resp 20  Ht 5\' 2"  (1.575 m)  Wt 155 lb (70.308 kg)  BMI 28.34 kg/m2  SpO2 99% Physical Exam  Constitutional: She is oriented to person, place, and time. She appears well-developed and well-nourished.  Cardiovascular: Normal rate and regular rhythm.   Pulmonary/Chest: Effort normal and breath sounds normal.  Musculoskeletal:  Tender in the medial aspect of the right knee. Pt has full rom. No gross deformity noted  Neurological: She is alert and oriented to person, place, and time.  Skin: Skin is warm and dry.  Psychiatric: She has a normal mood and affect.  Nursing note and vitals reviewed.   ED Course  Procedures (including critical care time) Labs Review Labs Reviewed - No data to display  Imaging Review No results found. I have personally reviewed and evaluated these images and lab results as part of my medical decision-making.   EKG Interpretation None      MDM   Final diagnoses:  Knee strain, right, initial encounter    [place] is negative for dvt. Pt is okay to follow up with ortho as needed. She has crutches and brace as needed.pt is neurovascularly intact   Glendell Docker, NP 02/23/15 2104  [place], MD 03/03/15 [phone]

## 2015-02-23 NOTE — Discharge Instructions (Signed)
Tendon Injury  Tendons are strong, cordlike structures that connect muscle to bone. Tendons are made up of woven fibers, like a rope. A tendon injury is a tear (rupture) of the tendon. The rupture may be partial (only a few of the fibers in your tendon rupture) or complete (your entire tendon ruptures).  CAUSES   Tendon injuries can be caused by high-stress activities, such as sports. They also can be caused by a repetitive injury or by a single injury from an excessive, rapid force.  SYMPTOMS   Symptoms of tendon injury include pain when you move the joint close to the tendon. Other symptoms are swelling, redness, and warmth.  DIAGNOSIS   Tendon injuries often can be diagnosed by physical exam. However, sometimes an X-ray exam or advanced imaging, such as magnetic resonance imaging (MRI), is necessary to determine the extent of the injury.  TREATMENT   Partial tendon ruptures often can be treated with immobilization. A splint, bandage, or removable brace usually is used to immobilize the injured tendon. Most injured tendons need to be immobilized for 1-2 months before they are completely healed. Complete tendon ruptures may require surgical reattachment.     This information is not intended to replace advice given to you by your health care provider. Make sure you discuss any questions you have with your health care provider.     Document Released: 05/25/2004 Document Revised: 04/06/2011 Document Reviewed: 07/09/2011  Elsevier Interactive Patient Education 2016 [employer]

## 2015-02-23 NOTE — ED Notes (Signed)
Pt taken to FT room 1 for doppler.

## 2015-02-23 NOTE — Progress Notes (Signed)
VASCULAR LAB PRELIMINARY  PRELIMINARY  PRELIMINARY  PRELIMINARY  Right lower extremity venous duplex completed.    Preliminary report:  Right:  No evidence of DVT, superficial thrombosis, or Baker's cyst.  Rozann Holts, RVT 02/23/2015, 8:34 PM

## 2015-02-23 NOTE — ED Notes (Signed)
Pt sent here for RLQ doppler for swelling and pain after accident on Friday

## 2015-03-17 ENCOUNTER — Other Ambulatory Visit: Payer: Self-pay | Admitting: Orthopedic Surgery

## 2015-03-17 DIAGNOSIS — M25561 Pain in right knee: Secondary | ICD-10-CM

## 2015-03-20 ENCOUNTER — Ambulatory Visit
Admission: RE | Admit: 2015-03-20 | Discharge: 2015-03-20 | Disposition: A | Discharge status: Home / Self Care | Payer: Worker's Compensation | Source: Ambulatory Visit | Attending: Orthopedic Surgery | Admitting: Orthopedic Surgery

## 2015-03-20 DIAGNOSIS — M25561 Pain in right knee: Secondary | ICD-10-CM

## 2015-07-21 ENCOUNTER — Inpatient Hospital Stay (HOSPITAL_COMMUNITY)
Admission: EM | Admit: 2015-07-21 | Discharge: 2015-07-31 | DRG: 202 | Disposition: A | Discharge status: Home / Self Care | Payer: Medicare Other | Attending: Internal Medicine | Admitting: Internal Medicine

## 2015-07-21 ENCOUNTER — Emergency Department (HOSPITAL_COMMUNITY): Payer: Medicare Other

## 2015-07-21 ENCOUNTER — Encounter (HOSPITAL_COMMUNITY): Payer: Self-pay | Admitting: Family Medicine

## 2015-07-21 DIAGNOSIS — Z87891 Personal history of nicotine dependence: Secondary | ICD-10-CM

## 2015-07-21 DIAGNOSIS — E1310 Other specified diabetes mellitus with ketoacidosis without coma: Secondary | ICD-10-CM | POA: Diagnosis present

## 2015-07-21 DIAGNOSIS — R0609 Other forms of dyspnea: Secondary | ICD-10-CM | POA: Insufficient documentation

## 2015-07-21 DIAGNOSIS — J45901 Unspecified asthma with (acute) exacerbation: Principal | ICD-10-CM | POA: Diagnosis present

## 2015-07-21 DIAGNOSIS — K5900 Constipation, unspecified: Secondary | ICD-10-CM | POA: Diagnosis not present

## 2015-07-21 DIAGNOSIS — T380X5A Adverse effect of glucocorticoids and synthetic analogues, initial encounter: Secondary | ICD-10-CM | POA: Diagnosis present

## 2015-07-21 DIAGNOSIS — R000 Tachycardia, unspecified: Secondary | ICD-10-CM | POA: Diagnosis present

## 2015-07-21 DIAGNOSIS — R079 Chest pain, unspecified: Secondary | ICD-10-CM | POA: Insufficient documentation

## 2015-07-21 DIAGNOSIS — I248 Other forms of acute ischemic heart disease: Secondary | ICD-10-CM | POA: Diagnosis not present

## 2015-07-21 DIAGNOSIS — D72829 Elevated white blood cell count, unspecified: Secondary | ICD-10-CM | POA: Diagnosis not present

## 2015-07-21 DIAGNOSIS — Z7951 Long term (current) use of inhaled steroids: Secondary | ICD-10-CM

## 2015-07-21 DIAGNOSIS — Z888 Allergy status to other drugs, medicaments and biological substances status: Secondary | ICD-10-CM

## 2015-07-21 DIAGNOSIS — K219 Gastro-esophageal reflux disease without esophagitis: Secondary | ICD-10-CM | POA: Diagnosis present

## 2015-07-21 DIAGNOSIS — E7800 Pure hypercholesterolemia, unspecified: Secondary | ICD-10-CM | POA: Diagnosis present

## 2015-07-21 DIAGNOSIS — Z9119 Patient's noncompliance with other medical treatment and regimen: Secondary | ICD-10-CM

## 2015-07-21 DIAGNOSIS — IMO0002 Reserved for concepts with insufficient information to code with codable children: Secondary | ICD-10-CM | POA: Diagnosis present

## 2015-07-21 DIAGNOSIS — R609 Edema, unspecified: Secondary | ICD-10-CM

## 2015-07-21 DIAGNOSIS — R0600 Dyspnea, unspecified: Secondary | ICD-10-CM

## 2015-07-21 DIAGNOSIS — T501X5A Adverse effect of loop [high-ceiling] diuretics, initial encounter: Secondary | ICD-10-CM | POA: Diagnosis not present

## 2015-07-21 DIAGNOSIS — Z825 Family history of asthma and other chronic lower respiratory diseases: Secondary | ICD-10-CM

## 2015-07-21 DIAGNOSIS — R091 Pleurisy: Secondary | ICD-10-CM | POA: Diagnosis not present

## 2015-07-21 DIAGNOSIS — Z791 Long term (current) use of non-steroidal anti-inflammatories (NSAID): Secondary | ICD-10-CM

## 2015-07-21 DIAGNOSIS — H9202 Otalgia, left ear: Secondary | ICD-10-CM | POA: Diagnosis present

## 2015-07-21 DIAGNOSIS — E8770 Fluid overload, unspecified: Secondary | ICD-10-CM | POA: Diagnosis not present

## 2015-07-21 DIAGNOSIS — Z79899 Other long term (current) drug therapy: Secondary | ICD-10-CM

## 2015-07-21 DIAGNOSIS — R911 Solitary pulmonary nodule: Secondary | ICD-10-CM | POA: Diagnosis present

## 2015-07-21 DIAGNOSIS — Z7982 Long term (current) use of aspirin: Secondary | ICD-10-CM

## 2015-07-21 DIAGNOSIS — I10 Essential (primary) hypertension: Secondary | ICD-10-CM | POA: Diagnosis present

## 2015-07-21 DIAGNOSIS — E785 Hyperlipidemia, unspecified: Secondary | ICD-10-CM | POA: Diagnosis present

## 2015-07-21 DIAGNOSIS — N179 Acute kidney failure, unspecified: Secondary | ICD-10-CM | POA: Diagnosis not present

## 2015-07-21 DIAGNOSIS — E1165 Type 2 diabetes mellitus with hyperglycemia: Secondary | ICD-10-CM | POA: Diagnosis present

## 2015-07-21 DIAGNOSIS — Z8249 Family history of ischemic heart disease and other diseases of the circulatory system: Secondary | ICD-10-CM

## 2015-07-21 DIAGNOSIS — R0602 Shortness of breath: Secondary | ICD-10-CM | POA: Diagnosis not present

## 2015-07-21 DIAGNOSIS — Z79890 Hormone replacement therapy (postmenopausal): Secondary | ICD-10-CM

## 2015-07-21 DIAGNOSIS — Z833 Family history of diabetes mellitus: Secondary | ICD-10-CM

## 2015-07-21 LAB — BASIC METABOLIC PANEL
Anion gap: 13 (ref 5–15)
BUN: 12 mg/dL (ref 6–20)
CO2: 25 mmol/L (ref 22–32)
Calcium: 9.6 mg/dL (ref 8.9–10.3)
Chloride: 99 mmol/L — ABNORMAL LOW (ref 101–111)
Creatinine, Ser: 0.91 mg/dL (ref 0.44–1.00)
GFR calc Af Amer: >60 mL/min (ref >60)
GFR calc non Af Amer: >60 mL/min (ref >60)
Glucose, Bld: 137 mg/dL — ABNORMAL HIGH (ref 65–99)
POTASSIUM: 3.1 mmol/L — AB (ref 3.5–5.1)
SODIUM: 137 mmol/L (ref 135–145)

## 2015-07-21 LAB — CBC
HEMATOCRIT: 39.7 % (ref 36.0–46.0)
Hemoglobin: 13 g/dL (ref 12.0–15.0)
MCH: 30.3 pg (ref 26.0–34.0)
MCHC: 32.7 g/dL (ref 30.0–36.0)
MCV: 92.5 fL (ref 78.0–100.0)
Platelets: 324 10*3/uL (ref 150–400)
RBC: 4.29 MIL/uL (ref 3.87–5.11)
RDW: 13.3 % (ref 11.5–15.5)
WBC: 10 10*3/uL (ref 4.0–10.5)

## 2015-07-21 LAB — TROPONIN I: Troponin I: <0.03 ng/mL (ref <0.031)

## 2015-07-21 LAB — BRAIN NATRIURETIC PEPTIDE: B NATRIURETIC PEPTIDE 5: 11.8 pg/mL (ref 0.0–100.0)

## 2015-07-21 LAB — GLUCOSE, CAPILLARY: Glucose-Capillary: 240 mg/dL — ABNORMAL HIGH (ref 65–99)

## 2015-07-21 MED ORDER — METHYLPREDNISOLONE SODIUM SUCC 125 MG IJ SOLR
60.0000 mg | Freq: Two times a day (BID) | INTRAMUSCULAR | Status: DC
  Administered 2015-07-21: 60 mg via INTRAVENOUS
  Filled 2015-07-21: qty 2

## 2015-07-21 MED ORDER — IPRATROPIUM BROMIDE 0.02 % IN SOLN
0.5000 mg | Freq: Once | RESPIRATORY_TRACT | Status: AC
Start: 2015-07-21 — End: 2015-07-21
  Administered 2015-07-21: 0.5 mg via RESPIRATORY_TRACT
  Filled 2015-07-21: qty 2.5

## 2015-07-21 MED ORDER — ALBUTEROL SULFATE (2.5 MG/3ML) 0.083% IN NEBU
INHALATION_SOLUTION | RESPIRATORY_TRACT | Status: AC
  Filled 2015-07-21: qty 6

## 2015-07-21 MED ORDER — POTASSIUM CHLORIDE CRYS ER 20 MEQ PO TBCR
20.0000 meq | EXTENDED_RELEASE_TABLET | Freq: Every morning | ORAL | Status: DC
  Administered 2015-07-22 – 2015-07-31 (×10): 20 meq via ORAL
  Filled 2015-07-21 (×11): qty 1

## 2015-07-21 MED ORDER — ALBUTEROL SULFATE (2.5 MG/3ML) 0.083% IN NEBU: 2.5000 mg | INHALATION_SOLUTION | Freq: Four times a day (QID) | RESPIRATORY_TRACT | Status: DC | PRN

## 2015-07-21 MED ORDER — HYDROCHLOROTHIAZIDE 25 MG PO TABS
25.0000 mg | ORAL_TABLET | Freq: Every day | ORAL | Status: DC
  Administered 2015-07-22 – 2015-07-26 (×5): 25 mg via ORAL
  Filled 2015-07-21 (×5): qty 1

## 2015-07-21 MED ORDER — ALBUTEROL (5 MG/ML) CONTINUOUS INHALATION SOLN
10.0000 mg/h | INHALATION_SOLUTION | Freq: Once | RESPIRATORY_TRACT | Status: AC
  Administered 2015-07-21: 10 mg/h via RESPIRATORY_TRACT
  Filled 2015-07-21: qty 20

## 2015-07-21 MED ORDER — PANTOPRAZOLE SODIUM 40 MG PO TBEC: 80.0000 mg | DELAYED_RELEASE_TABLET | Freq: Every day | ORAL | Status: DC

## 2015-07-21 MED ORDER — ALBUTEROL SULFATE (2.5 MG/3ML) 0.083% IN NEBU
2.5000 mg | INHALATION_SOLUTION | RESPIRATORY_TRACT | Status: DC | PRN
  Administered 2015-07-22: 2.5 mg via RESPIRATORY_TRACT
  Filled 2015-07-21: qty 3

## 2015-07-21 MED ORDER — ESOMEPRAZOLE MAGNESIUM 40 MG PO CPDR
40.0000 mg | DELAYED_RELEASE_CAPSULE | Freq: Every day | ORAL | Status: DC
  Administered 2015-07-22 – 2015-07-31 (×10): 40 mg via ORAL
  Filled 2015-07-21 (×10): qty 1

## 2015-07-21 MED ORDER — ATORVASTATIN CALCIUM 40 MG PO TABS
40.0000 mg | ORAL_TABLET | Freq: Every morning | ORAL | Status: DC
Start: 2015-07-22 — End: 2015-07-31
  Administered 2015-07-22 – 2015-07-31 (×10): 40 mg via ORAL
  Filled 2015-07-21 (×10): qty 1

## 2015-07-21 MED ORDER — ALBUTEROL SULFATE HFA 108 (90 BASE) MCG/ACT IN AERS: 2.0000 | INHALATION_SPRAY | Freq: Four times a day (QID) | RESPIRATORY_TRACT | Status: DC | PRN

## 2015-07-21 MED ORDER — SODIUM CHLORIDE 0.9 % IV SOLN
INTRAVENOUS | Status: DC
  Administered 2015-07-21 (×2): via INTRAVENOUS

## 2015-07-21 MED ORDER — ENOXAPARIN SODIUM 40 MG/0.4ML ~~LOC~~ SOLN
40.0000 mg | Freq: Every day | SUBCUTANEOUS | Status: DC
  Administered 2015-07-21 – 2015-07-30 (×10): 40 mg via SUBCUTANEOUS
  Filled 2015-07-21 (×10): qty 0.4

## 2015-07-21 MED ORDER — ESTRADIOL 1 MG PO TABS
1.0000 mg | ORAL_TABLET | Freq: Every morning | ORAL | Status: DC
  Administered 2015-07-22 – 2015-07-31 (×10): 1 mg via ORAL
  Filled 2015-07-21 (×21): qty 1

## 2015-07-21 MED ORDER — ALPRAZOLAM 0.5 MG PO TABS
0.5000 mg | ORAL_TABLET | Freq: Two times a day (BID) | ORAL | Status: DC | PRN
  Administered 2015-07-21 – 2015-07-30 (×11): 0.5 mg via ORAL
  Filled 2015-07-21 (×15): qty 1

## 2015-07-21 MED ORDER — VALSARTAN-HYDROCHLOROTHIAZIDE 320-25 MG PO TABS: 1.0000 | ORAL_TABLET | Freq: Every morning | ORAL | Status: DC

## 2015-07-21 MED ORDER — ASPIRIN EC 81 MG PO TBEC
81.0000 mg | DELAYED_RELEASE_TABLET | Freq: Every morning | ORAL | Status: DC
  Administered 2015-07-22 – 2015-07-31 (×10): 81 mg via ORAL
  Filled 2015-07-21 (×10): qty 1

## 2015-07-21 MED ORDER — ALBUTEROL SULFATE (2.5 MG/3ML) 0.083% IN NEBU
5.0000 mg | INHALATION_SOLUTION | Freq: Once | RESPIRATORY_TRACT | Status: AC
  Administered 2015-07-21: 5 mg via RESPIRATORY_TRACT

## 2015-07-21 MED ORDER — IRBESARTAN 300 MG PO TABS
300.0000 mg | ORAL_TABLET | Freq: Every day | ORAL | Status: DC
  Administered 2015-07-22 – 2015-07-26 (×5): 300 mg via ORAL
  Filled 2015-07-21 (×5): qty 1

## 2015-07-21 MED ORDER — METHYLPREDNISOLONE SODIUM SUCC 125 MG IJ SOLR
125.0000 mg | Freq: Once | INTRAMUSCULAR | Status: AC
  Administered 2015-07-21: 125 mg via INTRAVENOUS
  Filled 2015-07-21: qty 2

## 2015-07-21 NOTE — Progress Notes (Signed)
Attempted report. RN to call back in a few minutes.

## 2015-07-21 NOTE — ED Notes (Signed)
Pt placed into gown and on to monitor upon arrival to room. Pt remains monitored by blood pressure, pulse ox, and 5 lead. Dr. Tomi Bamberger and phlebotomy at bedside.

## 2015-07-21 NOTE — H&P (Signed)
[facility] History and Physical  [name] [id number] DOB: 10-30-1950 DOA: 07/21/2015  Referring physician: EDP PCP: [name], MD   Chief Complaint: Asthma   HPI: [name] is a 65 y.o. female who presents to the ED with c/o SOB.  She has an extensive history of asthma in the past as well.  This morning she developed onset of SOB with wheezing.  Feels typical for asthma exacerbation.  Used inhaler but symptoms continued to progress throughout the day.  She presents to the ED.  No fevers, chills, N/V/D, leg swelling.  Asthma often triggered by seasonal changes.  Review of Systems: Systems reviewed.  As above, otherwise negative  Past Medical History  Diagnosis Date  . Asthma   . Diabetes mellitus   . Hypertension   . Dyslipidemia   . Pneumonia   . Bronchitis   . GERD (gastroesophageal reflux disease)    Past Surgical History  Procedure Laterality Date  . Abdominal hysterectomy    . Hernia repair    . Knee arthroscopy    . Hemorrhoid surgery    . Esophagogastroduodenoscopy (egd) with propofol N/A 04/09/2014    Procedure: ESOPHAGOGASTRODUODENOSCOPY (EGD) WITH PROPOFOL;  Surgeon: Cleotis Nipper, MD;  Location: [facility];  Service: Endoscopy;  Laterality: N/A;  . Colonoscopy with propofol N/A 04/09/2014    Procedure: COLONOSCOPY WITH PROPOFOL;  Surgeon: Cleotis Nipper, MD;  Location: [facility];  Service: Endoscopy;  Laterality: N/A;   Social History:  reports that she quit smoking about 11 years ago. Her smoking use included Cigarettes. She has a 30 pack-year smoking history. She has never used smokeless tobacco. She reports that she does not drink alcohol or use illicit drugs.  Allergies  Allergen Reactions  . Amlodipine Besylate Other (See Comments)    Reaction unknown  . Lisinopril Other (See Comments)    Reaction unknown  . Pantoprazole Sodium Other (See Comments)    Reaction unknown    Family History  Problem Relation Age of Onset   . Diabetes type II    . Hypertension    . Allergies Mother   . Asthma Mother      Prior to Admission medications   Medication Sig Start Date End Date Taking? Authorizing Provider  albuterol (PROVENTIL HFA;VENTOLIN HFA) 108 (90 BASE) MCG/ACT inhaler Inhale 2 puffs into the lungs every 6 (six) hours as needed for wheezing or shortness of breath. 07/30/14  Yes Gregor Hams, MD  albuterol (PROVENTIL) (2.5 MG/3ML) 0.083% nebulizer solution Take 3 mLs (2.5 mg total) by nebulization every 4 (four) hours as needed for shortness of breath. 08/08/14  Yes [name], MD  ALPRAZolam Duanne Moron) 0.5 MG tablet Take 0.5 mg by mouth 2 (two) times daily as needed for anxiety.    Yes Historical Provider, MD  aspirin EC 81 MG tablet Take 81 mg by mouth every morning.    Yes Historical Provider, MD  atorvastatin (LIPITOR) 40 MG tablet Take 40 mg by mouth every morning.    Yes Historical Provider, MD  esomeprazole (NEXIUM) 40 MG capsule Take 40 mg by mouth daily before breakfast.   Yes Historical Provider, MD  estradiol (ESTRACE) 1 MG tablet Take 1 mg by mouth every morning.    Yes Historical Provider, MD  potassium chloride SA (K-DUR,KLOR-CON) 20 MEQ tablet Take 20 mEq by mouth every morning.    Yes Historical Provider, MD  valsartan-hydrochlorothiazide (DIOVAN-HCT) 320-25 MG per tablet Take 1 tablet by mouth every morning.  Yes Historical Provider, MD   Physical Exam: Filed Vitals:   07/21/15 1915 07/21/15 1930  BP: 187/81 175/81  Pulse: 122 125  Temp:    Resp: 27 27    BP 175/81 mmHg  Pulse 125  Temp(Src) 97.9 F (36.6 C) (Oral)  Resp 27  SpO2 100%  General Appearance:    Alert, oriented, no distress, appears stated age  Head:    Normocephalic, atraumatic  Eyes:    PERRL, EOMI, sclera non-icteric        Nose:   Nares without drainage or epistaxis. Mucosa, turbinates normal  Throat:   Moist mucous membranes. Oropharynx without erythema or exudate.  Neck:   Supple. No carotid bruits.  No  thyromegaly.  No lymphadenopathy.   Back:     No CVA tenderness, no spinal tenderness  Lungs:     Clear to auscultation bilaterally, without wheezes, rhonchi or rales  Chest wall:    No tenderness to palpitation  Heart:    Regular rate and rhythm without murmurs, gallops, rubs  Abdomen:     Soft, non-tender, nondistended, normal bowel sounds, no organomegaly  Genitalia:    deferred  Rectal:    deferred  Extremities:   No clubbing, cyanosis or edema.  Pulses:   2+ and symmetric all extremities  Skin:   Skin color, texture, turgor normal, no rashes or lesions  Lymph nodes:   Cervical, supraclavicular, and axillary nodes normal  Neurologic:   CNII-XII intact. Normal strength, sensation and reflexes      throughout    Labs on Admission:  Basic Metabolic Panel:  Recent Labs Lab 07/21/15 1730  NA 137  K 3.1*  CL 99*  CO2 25  GLUCOSE 137*  BUN 12  CREATININE 0.91  CALCIUM 9.6   Liver Function Tests: No results for input(s): AST, ALT, ALKPHOS, BILITOT, PROT, ALBUMIN in the last 168 hours. No results for input(s): LIPASE, AMYLASE in the last 168 hours. No results for input(s): AMMONIA in the last 168 hours. CBC:  Recent Labs Lab 07/21/15 1730  WBC 10.0  HGB 13.0  HCT 39.7  MCV 92.5  PLT 324   Cardiac Enzymes:  Recent Labs Lab 07/21/15 1710  TROPONINI <0.03    BNP (last 3 results) No results for input(s): PROBNP in the last 8760 hours. CBG: No results for input(s): GLUCAP in the last 168 hours.  Radiological Exams on Admission: Dg Chest Port 1 View  07/21/2015  CLINICAL DATA:  Shortness of breath and wheezing beginning this morning. History of asthma, diabetes, and hypertension. Former smoker. EXAM: PORTABLE CHEST 1 VIEW COMPARISON:  08/06/2014 FINDINGS: The cardiomediastinal silhouette is within normal limits. There is slight central airway thickening which is less than on the prior study. No focal airspace consolidation, edema, pleural effusion, or pneumothorax is  identified. Mild thoracic spondylosis is noted. IMPRESSION: Decreased bronchitic changes.  No airspace consolidation. Electronically Signed   By: Logan Bores M.D.   On: 07/21/2015 17:50    EKG: Independently reviewed.  Assessment/Plan Active Problems:   Asthma exacerbation   1. Asthma Exacerbation -  1. Adult wheeze protocol 2. Steroids 3. Tele monitor for tachycardia    Code Status: Full  Family Communication: No family in room Disposition Plan: Admit to obs   Time spent: 50 min  GARDNER, JARED M. [facility] Pager [phone]  If 7AM-7PM, please contact the day team taking care of the patient [email or web address] Password [facility] 07/21/2015, 9:50 PM

## 2015-07-21 NOTE — ED Notes (Addendum)
Pt here for asthma exacerbation. St started his am and she used her inhaler once before work. Denies cough, fever. Audible wheezing.

## 2015-07-21 NOTE — ED Provider Notes (Signed)
CSN: [id number]     Arrival date & time 07/21/15  1622 History   First MD Initiated Contact with Patient 07/21/15 1657     Chief Complaint  Patient presents with  . Asthma   HPI Patient presents to the emergency room with complaints of shortness of breath. Patient has a history of asthma. This morning when she started having difficulty breathing with increased wheezing. She felt like her asthma was acting up. She used her inhaler once while going Into work but as the day progressed her symptoms became more severe. Patient feels very short of breath now. She has tightness and pain in the center and the left side of her chest. Symptoms get worse when she tries to take a deep breath or cough. She is not bringing anything up when she coughs. She denies any leg swelling. No fevers or chills. Her asthma is often triggered by seasonal changes. She denies any history of heart disease or heart failure. Past Medical History  Diagnosis Date  . Asthma   . Diabetes mellitus   . Hypertension   . Dyslipidemia   . Pneumonia   . Bronchitis   . GERD (gastroesophageal reflux disease)    Past Surgical History  Procedure Laterality Date  . Abdominal hysterectomy    . Hernia repair    . Knee arthroscopy    . Hemorrhoid surgery    . Esophagogastroduodenoscopy (egd) with propofol N/A 04/09/2014    Procedure: ESOPHAGOGASTRODUODENOSCOPY (EGD) WITH PROPOFOL;  Surgeon: Cleotis Nipper, MD;  Location: [facility];  Service: Endoscopy;  Laterality: N/A;  . Colonoscopy with propofol N/A 04/09/2014    Procedure: COLONOSCOPY WITH PROPOFOL;  Surgeon: Cleotis Nipper, MD;  Location: [facility];  Service: Endoscopy;  Laterality: N/A;   Family History  Problem Relation Age of Onset  . Diabetes type II    . Hypertension    . Allergies Mother   . Asthma Mother    Social History  Substance Use Topics  . Smoking status: Former Smoker -- 1.00 packs/day for 30 years    Types: Cigarettes    Quit date: 01/09/2004   . Smokeless tobacco: Never Used  . Alcohol Use: No   OB History    No data available     Review of Systems  All other systems reviewed and are negative.     Allergies  Amlodipine besylate; Lisinopril; and Pantoprazole sodium  Home Medications   Prior to Admission medications   Medication Sig Start Date End Date Taking? Authorizing Provider  albuterol (PROVENTIL HFA;VENTOLIN HFA) 108 (90 BASE) MCG/ACT inhaler Inhale 2 puffs into the lungs every 6 (six) hours as needed for wheezing or shortness of breath. 07/30/14  Yes Gregor Hams, MD  albuterol (PROVENTIL) (2.5 MG/3ML) 0.083% nebulizer solution Take 3 mLs (2.5 mg total) by nebulization every 4 (four) hours as needed for shortness of breath. 08/08/14  Yes [name], MD  ALPRAZolam Duanne Moron) 0.5 MG tablet Take 0.5 mg by mouth 2 (two) times daily as needed for anxiety.    Yes Historical Provider, MD  aspirin EC 81 MG tablet Take 81 mg by mouth every morning.    Yes Historical Provider, MD  atorvastatin (LIPITOR) 40 MG tablet Take 40 mg by mouth every morning.    Yes Historical Provider, MD  esomeprazole (NEXIUM) 40 MG capsule Take 40 mg by mouth daily before breakfast.   Yes Historical Provider, MD  estradiol (ESTRACE) 1 MG tablet Take 1 mg by mouth every morning.  Yes Historical Provider, MD  potassium chloride SA (K-DUR,KLOR-CON) 20 MEQ tablet Take 20 mEq by mouth every morning.    Yes Historical Provider, MD  valsartan-hydrochlorothiazide (DIOVAN-HCT) 320-25 MG per tablet Take 1 tablet by mouth every morning.    Yes Historical Provider, MD  famotidine (PEPCID) 20 MG tablet One at bedtime 08/27/14   Tanda Rockers, MD  fluticasone [facility]) 50 MCG/ACT nasal spray Place 2 sprays into both nostrils daily. 07/30/14   Gregor Hams, MD  mometasone-formoterol (DULERA) 100-5 MCG/ACT AERO Take 2 puffs first thing in am and then another 2 puffs about 12 hours later. 08/27/14   Tanda Rockers, MD  naproxen (EC NAPROSYN) 500 MG EC tablet Take  1 tablet (500 mg total) by mouth 2 (two) times daily with a meal. 02/19/15   Jenise V Bacon Menshew, PA-C  traMADol (ULTRAM) 50 MG tablet Take 1 tablet (50 mg total) by mouth 2 (two) times daily. 02/19/15   Jenise V Bacon Menshew, PA-C   BP 175/81 mmHg  Pulse 125  Temp(Src) 97.9 F (36.6 C) (Oral)  Resp 27  SpO2 100% Physical Exam  Constitutional: She appears well-developed and well-nourished. No distress.  HENT:  Head: Normocephalic and atraumatic.  Right Ear: External ear normal.  Left Ear: External ear normal.  Eyes: Conjunctivae are normal. Right eye exhibits no discharge. Left eye exhibits no discharge. No scleral icterus.  Neck: Neck supple. No tracheal deviation present.  Cardiovascular: Normal rate, regular rhythm and intact distal pulses.   Pulmonary/Chest: Accessory muscle usage present. No stridor. She is in respiratory distress. She has decreased breath sounds. She has wheezes. She has no rales.  Abdominal: Soft. Bowel sounds are normal. She exhibits no distension. There is no tenderness. There is no rebound and no guarding.  Musculoskeletal: She exhibits no edema or tenderness.  Neurological: She is alert. She has normal strength. No cranial nerve deficit (no facial droop, extraocular movements intact, no slurred speech) or sensory deficit. She exhibits normal muscle tone. She displays no seizure activity. Coordination normal.  Skin: Skin is warm and dry. No rash noted.  Psychiatric: She has a normal mood and affect.  Nursing note and vitals reviewed.   ED Course  Procedures (including critical care time) Labs Review Labs Reviewed  BASIC METABOLIC PANEL - Abnormal; Notable for the following:    Potassium 3.1 (*)    Chloride 99 (*)    Glucose, Bld 137 (*)    All other components within normal limits  CBC  TROPONIN I  BRAIN NATRIURETIC PEPTIDE    Imaging Review Dg Chest Port 1 View  07/21/2015  CLINICAL DATA:  Shortness of breath and wheezing beginning this  morning. History of asthma, diabetes, and hypertension. Former smoker. EXAM: PORTABLE CHEST 1 VIEW COMPARISON:  08/06/2014 FINDINGS: The cardiomediastinal silhouette is within normal limits. There is slight central airway thickening which is less than on the prior study. No focal airspace consolidation, edema, pleural effusion, or pneumothorax is identified. Mild thoracic spondylosis is noted. IMPRESSION: Decreased bronchitic changes.  No airspace consolidation. Electronically Signed   By: Logan Bores M.D.   On: 07/21/2015 17:50   I have personally reviewed and evaluated these images and lab results as part of my medical decision-making.   EKG Interpretation   Date/Time:  Wednesday July 21 2015 16:25:47 EDT Ventricular Rate:  89 PR Interval:  154 QRS Duration: 84 QT Interval:  354 QTC Calculation: 430 R Axis:   59 Text Interpretation:  Normal sinus rhythm  Nonspecific T wave abnormality  Abnormal ECG No significant change since last tracing Confirmed by Andres Bantz   MD-J, Ashlei Chinchilla [id number]) on 07/21/2015 5:11:47 PM     Medications  0.9 %  sodium chloride infusion ( Intravenous New Bag/Given 07/21/15 1856)  albuterol (PROVENTIL) (2.5 MG/3ML) 0.083% nebulizer solution 5 mg (5 mg Nebulization Given 07/21/15 1636)  albuterol (PROVENTIL,VENTOLIN) solution continuous neb (10 mg/hr Nebulization Given 07/21/15 1743)  ipratropium (ATROVENT) nebulizer solution 0.5 mg (0.5 mg Nebulization Given 07/21/15 1743)  methylPREDNISolone sodium succinate (SOLU-MEDROL) 125 mg/2 mL injection 125 mg (125 mg Intravenous Given 07/21/15 1856)   2030  On repeat exam, pt is still tachypnic and tachycardic.  Wheezing and decreased air movement. MDM   Final diagnoses:  Asthma exacerbation    Patient presented to the emergency room with complaints of wheezing.  She has a history of asthma. Chest x-ray does not show pneumonia. Laboratory tests do not suggest he just heart failure or acute cardiac anemia.  She was treated with  albuterol Atrovent as well as Solu-Medrol. She had some improvement however remained tachypneic, tachycardic and had persistent wheezing.  I will consult the medical service for admission and further treatment    Dorie Rank, MD 07/21/15 2113

## 2015-07-21 NOTE — Progress Notes (Signed)
Received report from Rod Holler, RN in ED for transfer of pt to [phone].

## 2015-07-22 DIAGNOSIS — I10 Essential (primary) hypertension: Secondary | ICD-10-CM | POA: Diagnosis present

## 2015-07-22 DIAGNOSIS — R600 Localized edema: Secondary | ICD-10-CM | POA: Diagnosis not present

## 2015-07-22 DIAGNOSIS — Z8249 Family history of ischemic heart disease and other diseases of the circulatory system: Secondary | ICD-10-CM | POA: Diagnosis not present

## 2015-07-22 DIAGNOSIS — J45901 Unspecified asthma with (acute) exacerbation: Secondary | ICD-10-CM | POA: Diagnosis present

## 2015-07-22 DIAGNOSIS — Z79890 Hormone replacement therapy (postmenopausal): Secondary | ICD-10-CM | POA: Diagnosis not present

## 2015-07-22 DIAGNOSIS — R0602 Shortness of breath: Secondary | ICD-10-CM | POA: Diagnosis present

## 2015-07-22 DIAGNOSIS — Z87891 Personal history of nicotine dependence: Secondary | ICD-10-CM | POA: Diagnosis not present

## 2015-07-22 DIAGNOSIS — E8770 Fluid overload, unspecified: Secondary | ICD-10-CM | POA: Diagnosis not present

## 2015-07-22 DIAGNOSIS — Z794 Long term (current) use of insulin: Secondary | ICD-10-CM

## 2015-07-22 DIAGNOSIS — Z833 Family history of diabetes mellitus: Secondary | ICD-10-CM | POA: Diagnosis not present

## 2015-07-22 DIAGNOSIS — H9202 Otalgia, left ear: Secondary | ICD-10-CM | POA: Diagnosis present

## 2015-07-22 DIAGNOSIS — T501X5A Adverse effect of loop [high-ceiling] diuretics, initial encounter: Secondary | ICD-10-CM | POA: Diagnosis not present

## 2015-07-22 DIAGNOSIS — Z9119 Patient's noncompliance with other medical treatment and regimen: Secondary | ICD-10-CM | POA: Diagnosis not present

## 2015-07-22 DIAGNOSIS — R091 Pleurisy: Secondary | ICD-10-CM | POA: Diagnosis not present

## 2015-07-22 DIAGNOSIS — T380X5A Adverse effect of glucocorticoids and synthetic analogues, initial encounter: Secondary | ICD-10-CM | POA: Diagnosis present

## 2015-07-22 DIAGNOSIS — Z79899 Other long term (current) drug therapy: Secondary | ICD-10-CM | POA: Diagnosis not present

## 2015-07-22 DIAGNOSIS — R079 Chest pain, unspecified: Secondary | ICD-10-CM | POA: Diagnosis not present

## 2015-07-22 DIAGNOSIS — Z7951 Long term (current) use of inhaled steroids: Secondary | ICD-10-CM | POA: Diagnosis not present

## 2015-07-22 DIAGNOSIS — Z825 Family history of asthma and other chronic lower respiratory diseases: Secondary | ICD-10-CM | POA: Diagnosis not present

## 2015-07-22 DIAGNOSIS — K219 Gastro-esophageal reflux disease without esophagitis: Secondary | ICD-10-CM | POA: Diagnosis present

## 2015-07-22 DIAGNOSIS — R0781 Pleurodynia: Secondary | ICD-10-CM | POA: Diagnosis not present

## 2015-07-22 DIAGNOSIS — E1310 Other specified diabetes mellitus with ketoacidosis without coma: Secondary | ICD-10-CM | POA: Diagnosis present

## 2015-07-22 DIAGNOSIS — Z791 Long term (current) use of non-steroidal anti-inflammatories (NSAID): Secondary | ICD-10-CM | POA: Diagnosis not present

## 2015-07-22 DIAGNOSIS — R0600 Dyspnea, unspecified: Secondary | ICD-10-CM | POA: Diagnosis not present

## 2015-07-22 DIAGNOSIS — R0789 Other chest pain: Secondary | ICD-10-CM | POA: Diagnosis not present

## 2015-07-22 DIAGNOSIS — K5900 Constipation, unspecified: Secondary | ICD-10-CM | POA: Diagnosis not present

## 2015-07-22 DIAGNOSIS — N179 Acute kidney failure, unspecified: Secondary | ICD-10-CM | POA: Diagnosis not present

## 2015-07-22 DIAGNOSIS — E785 Hyperlipidemia, unspecified: Secondary | ICD-10-CM | POA: Diagnosis present

## 2015-07-22 DIAGNOSIS — Z888 Allergy status to other drugs, medicaments and biological substances status: Secondary | ICD-10-CM | POA: Diagnosis not present

## 2015-07-22 DIAGNOSIS — E1165 Type 2 diabetes mellitus with hyperglycemia: Secondary | ICD-10-CM | POA: Diagnosis not present

## 2015-07-22 DIAGNOSIS — I248 Other forms of acute ischemic heart disease: Secondary | ICD-10-CM | POA: Diagnosis not present

## 2015-07-22 DIAGNOSIS — Z7982 Long term (current) use of aspirin: Secondary | ICD-10-CM | POA: Diagnosis not present

## 2015-07-22 DIAGNOSIS — R000 Tachycardia, unspecified: Secondary | ICD-10-CM | POA: Diagnosis present

## 2015-07-22 DIAGNOSIS — R911 Solitary pulmonary nodule: Secondary | ICD-10-CM | POA: Diagnosis present

## 2015-07-22 DIAGNOSIS — D72829 Elevated white blood cell count, unspecified: Secondary | ICD-10-CM | POA: Diagnosis not present

## 2015-07-22 DIAGNOSIS — R609 Edema, unspecified: Secondary | ICD-10-CM | POA: Diagnosis not present

## 2015-07-22 LAB — BASIC METABOLIC PANEL
Anion gap: 11 (ref 5–15)
BUN: 11 mg/dL (ref 6–20)
CHLORIDE: 104 mmol/L (ref 101–111)
CO2: 23 mmol/L (ref 22–32)
Calcium: 8.7 mg/dL — ABNORMAL LOW (ref 8.9–10.3)
Creatinine, Ser: 0.91 mg/dL (ref 0.44–1.00)
GFR calc Af Amer: >60 mL/min (ref >60)
GLUCOSE: 200 mg/dL — AB (ref 65–99)
Potassium: 3.6 mmol/L (ref 3.5–5.1)
Sodium: 138 mmol/L (ref 135–145)

## 2015-07-22 LAB — GLUCOSE, CAPILLARY
GLUCOSE-CAPILLARY: 167 mg/dL — AB (ref 65–99)
GLUCOSE-CAPILLARY: 245 mg/dL — AB (ref 65–99)
GLUCOSE-CAPILLARY: 252 mg/dL — AB (ref 65–99)
Glucose-Capillary: 274 mg/dL — ABNORMAL HIGH (ref 65–99)

## 2015-07-22 LAB — INFLUENZA PANEL BY PCR (TYPE A & B)
H1N1FLUPCR: NOT DETECTED
INFLBPCR: NEGATIVE
Influenza A By PCR: NEGATIVE

## 2015-07-22 MED ORDER — FAMOTIDINE 20 MG PO TABS
20.0000 mg | ORAL_TABLET | Freq: Every day | ORAL | Status: DC
  Administered 2015-07-22 – 2015-07-26 (×5): 20 mg via ORAL
  Filled 2015-07-22 (×5): qty 1

## 2015-07-22 MED ORDER — INSULIN ASPART 100 UNIT/ML ~~LOC~~ SOLN
0.0000 [IU] | Freq: Three times a day (TID) | SUBCUTANEOUS | Status: DC
  Administered 2015-07-22 – 2015-07-23 (×3): 3 [IU] via SUBCUTANEOUS
  Administered 2015-07-23: 5 [IU] via SUBCUTANEOUS
  Administered 2015-07-23: 2 [IU] via SUBCUTANEOUS
  Administered 2015-07-24: 3 [IU] via SUBCUTANEOUS
  Administered 2015-07-24: 5 [IU] via SUBCUTANEOUS
  Administered 2015-07-24: 3 [IU] via SUBCUTANEOUS
  Administered 2015-07-25: 7 [IU] via SUBCUTANEOUS
  Administered 2015-07-25 – 2015-07-26 (×3): 3 [IU] via SUBCUTANEOUS
  Administered 2015-07-26: 5 [IU] via SUBCUTANEOUS
  Administered 2015-07-26: 3 [IU] via SUBCUTANEOUS
  Administered 2015-07-27: 5 [IU] via SUBCUTANEOUS
  Administered 2015-07-27: 2 [IU] via SUBCUTANEOUS
  Administered 2015-07-27 – 2015-07-28 (×2): 3 [IU] via SUBCUTANEOUS
  Administered 2015-07-28: 2 [IU] via SUBCUTANEOUS
  Administered 2015-07-29: 5 [IU] via SUBCUTANEOUS
  Administered 2015-07-29 – 2015-07-30 (×2): 2 [IU] via SUBCUTANEOUS
  Administered 2015-07-30: 5 [IU] via SUBCUTANEOUS

## 2015-07-22 MED ORDER — GUAIFENESIN-CODEINE 100-10 MG/5ML PO SOLN
5.0000 mL | Freq: Four times a day (QID) | ORAL | Status: DC | PRN
  Administered 2015-07-22 – 2015-07-23 (×2): 5 mL via ORAL
  Filled 2015-07-22 (×2): qty 5

## 2015-07-22 MED ORDER — AMOXICILLIN-POT CLAVULANATE 875-125 MG PO TABS
1.0000 | ORAL_TABLET | Freq: Two times a day (BID) | ORAL | Status: DC
  Administered 2015-07-22 (×2): 1 via ORAL
  Filled 2015-07-22 (×2): qty 1

## 2015-07-22 MED ORDER — HYDROCOD POLST-CPM POLST ER 10-8 MG/5ML PO SUER
5.0000 mL | Freq: Once | ORAL | Status: AC
  Administered 2015-07-22: 5 mL via ORAL
  Filled 2015-07-22: qty 5

## 2015-07-22 MED ORDER — MOMETASONE FURO-FORMOTEROL FUM 200-5 MCG/ACT IN AERO
2.0000 | INHALATION_SPRAY | Freq: Two times a day (BID) | RESPIRATORY_TRACT | Status: DC
  Administered 2015-07-22 – 2015-07-24 (×4): 2 via RESPIRATORY_TRACT
  Filled 2015-07-22: qty 8.8

## 2015-07-22 MED ORDER — HYDROCODONE-ACETAMINOPHEN 5-325 MG PO TABS
2.0000 | ORAL_TABLET | Freq: Once | ORAL | Status: AC
  Administered 2015-07-22: 2 via ORAL
  Filled 2015-07-22: qty 2

## 2015-07-22 MED ORDER — BENZONATATE 100 MG PO CAPS
100.0000 mg | ORAL_CAPSULE | Freq: Three times a day (TID) | ORAL | Status: DC
  Administered 2015-07-22 (×3): 100 mg via ORAL
  Filled 2015-07-22 (×3): qty 1

## 2015-07-22 MED ORDER — LORATADINE 10 MG PO TABS
10.0000 mg | ORAL_TABLET | Freq: Every day | ORAL | Status: DC
  Administered 2015-07-22 – 2015-07-31 (×10): 10 mg via ORAL
  Filled 2015-07-22 (×10): qty 1

## 2015-07-22 MED ORDER — HYDROCODONE-ACETAMINOPHEN 5-325 MG PO TABS
1.0000 | ORAL_TABLET | ORAL | Status: DC | PRN
  Administered 2015-07-22 – 2015-07-31 (×4): 2 via ORAL
  Filled 2015-07-22 (×4): qty 2

## 2015-07-22 MED ORDER — ALBUTEROL SULFATE (2.5 MG/3ML) 0.083% IN NEBU
2.5000 mg | INHALATION_SOLUTION | RESPIRATORY_TRACT | Status: DC | PRN
  Administered 2015-07-24 (×2): 2.5 mg via RESPIRATORY_TRACT
  Filled 2015-07-22 (×2): qty 3

## 2015-07-22 MED ORDER — HYDROMORPHONE HCL 1 MG/ML IJ SOLN
1.0000 mg | INTRAMUSCULAR | Status: DC | PRN
  Administered 2015-07-23 – 2015-07-29 (×11): 1 mg via INTRAVENOUS
  Filled 2015-07-22 (×12): qty 1

## 2015-07-22 MED ORDER — METHYLPREDNISOLONE SODIUM SUCC 125 MG IJ SOLR
60.0000 mg | Freq: Four times a day (QID) | INTRAMUSCULAR | Status: DC
  Administered 2015-07-22 – 2015-07-27 (×21): 60 mg via INTRAVENOUS
  Filled 2015-07-22 (×21): qty 2

## 2015-07-22 MED ORDER — HYDRALAZINE HCL 20 MG/ML IJ SOLN
5.0000 mg | Freq: Once | INTRAMUSCULAR | Status: AC
  Administered 2015-07-22: 5 mg via INTRAVENOUS
  Filled 2015-07-22: qty 1

## 2015-07-22 MED ORDER — INSULIN ASPART 100 UNIT/ML ~~LOC~~ SOLN
0.0000 [IU] | Freq: Every day | SUBCUTANEOUS | Status: DC
  Administered 2015-07-22 (×2): 3 [IU] via SUBCUTANEOUS
  Administered 2015-07-23 – 2015-07-24 (×2): 2 [IU] via SUBCUTANEOUS
  Administered 2015-07-25: 3 [IU] via SUBCUTANEOUS
  Administered 2015-07-26: 2 [IU] via SUBCUTANEOUS
  Administered 2015-07-27: 3 [IU] via SUBCUTANEOUS
  Administered 2015-07-29: 2 [IU] via SUBCUTANEOUS
  Administered 2015-07-30: 3 [IU] via SUBCUTANEOUS

## 2015-07-22 MED ORDER — ALBUTEROL SULFATE (2.5 MG/3ML) 0.083% IN NEBU
2.5000 mg | INHALATION_SOLUTION | RESPIRATORY_TRACT | Status: DC
  Administered 2015-07-22 – 2015-07-23 (×6): 2.5 mg via RESPIRATORY_TRACT
  Filled 2015-07-22 (×6): qty 3

## 2015-07-22 NOTE — Care Management Obs Status (Signed)
[facility] NOTIFICATION   Patient Details  Name: [name] MRN: [id number] Date of Birth: 17-Jul-1950   Medicare Observation Status Notification Given:  Yes    CrutchfieldAntony Haste, RN 07/22/2015, 12:10 PM

## 2015-07-22 NOTE — Progress Notes (Signed)
Pt c/o increasing chest pain r/t cough. Schorr, NP paged and notified. NP Ordered one time dose of 2 tab vicodin and tussionex 16mL suspension. Will continue to monitor pt.

## 2015-07-22 NOTE — Progress Notes (Signed)
NURSING PROGRESS NOTE  [name] [id number] Admission Data: 07/21/14 10:28 PM Attending Provider: Etta Quill, DO [id number], [name], MD Code Status: Full  Allergies:  Amlodipine besylate; Lisinopril; and Pantoprazole sodium Past Medical History:   has a past medical history of Asthma; Diabetes mellitus; Hypertension; Dyslipidemia; Pneumonia; Bronchitis; and GERD (gastroesophageal reflux disease). Past Surgical History:   has past surgical history that includes Abdominal hysterectomy; Hernia repair; Knee arthroscopy; Hemorrhoid surgery; Esophagogastroduodenoscopy (egd) with propofol (N/A, 04/09/2014); and Colonoscopy with propofol (N/A, 04/09/2014). Social History:   reports that she quit smoking about 11 years ago. Her smoking use included Cigarettes. She has a 30 pack-year smoking history. She has never used smokeless tobacco. She reports that she does not drink alcohol or use illicit drugs.  [name] is a 65 y.o. female patient admitted from ED:   Last Documented Vital Signs: Blood pressure 180/83, pulse 101, temperature 98.2 F (36.8 C), temperature source Oral, resp. rate 20, height 5' 2.5" (1.588 m), weight 74.299 kg (163 lb 12.8 oz), SpO2 100 %.  Cardiac Monitoring: Box # 27 in place. Cardiac monitor yields:normal sinus rhythm.  IV Fluids:  IV in place, occlusive dsg intact without redness, IV cath wrist right, condition patent and no redness normal saline.   Skin: WDL  Patient/Family orientated to room. Information packet given to patient/family. Admission inpatient armband information verified with patient/family to include name and date of birth and placed on patient arm. Side rails up x 2, fall assessment and education completed with patient/family. Patient/family able to verbalize understanding of risk associated with falls and verbalized understanding to call for assistance before getting out of bed. Call light within reach. Patient/family able to voice and  demonstrate understanding of unit orientation instructions.    Will continue to evaluate and treat per MD orders.   Amaryllis Dyke, RN

## 2015-07-22 NOTE — Progress Notes (Signed)
Inpatient Diabetes Program Recommendations  AACE/ADA: New Consensus Statement on Inpatient Glycemic Control (2015)  Target Ranges:  Prepandial:   less than 140 mg/dL      Peak postprandial:   less than 180 mg/dL (1-2 hours)      Critically ill patients:  140 - 180 mg/dL   Results for [name], [name] (MRN [id number]) as of 07/22/2015 13:14  Ref. Range 07/21/2015 23:56 07/22/2015 08:48 07/22/2015 11:53  Glucose-Capillary Latest Ref Range: 65-99 mg/dL 240 (H) 167 (H) 245 (H)   Review of Glycemic Control  Diabetes history: DM 2 Current orders for Inpatient glycemic control: Novolog Sensitive + HS  Inpatient Diabetes Program Recommendations: Correction (SSI): IV Solumedrol started 60 mg Q6hrs. Please consider increasing correction to Novolog Moderate or Resistant scale.  Thanks,  Tama Headings RN, MSN, [facility] Inpatient Diabetes Coordinator Team Pager [phone] (8a-5p)

## 2015-07-22 NOTE — Progress Notes (Signed)
[facility]                                                                              Patient Demographics  [name], is a 65 y.o. female, DOB - 11-27-1950, [id number]  Admit date - 07/21/2015   Admitting Physician Etta Quill, DO  Outpatient Primary MD for the patient is [name], MD  LOS -   days    Chief Complaint  Patient presents with  . Asthma       Brief HPI   65 y.o. female who presents to the ED with c/o SOB. She has an extensive history of asthma in the past as well. This morning she developed onset of SOB with wheezing. Feels typical for asthma exacerbation. Used inhaler but symptoms continued to progress throughout the day.   Assessment & Plan    Active Problems:   Asthma exacerbation - Feels slightly better this morning, continue scheduled albuterol, added dulera, IV steroids - Placed on Augmentin, Claritin, cough suppressants with the Tessalon and Robitussin with codeine - Influenza panel negative  Left ear pain - Left ear with pain and mild discharge on ear examination, placed on Augmentin and Claritin  Diabetes mellitus - Continue cardiac modify diet, A1c, sliding scale insulin  Hypertension - Currently stable, continue Avapro, HCTZ  Hyperlipidemia - Continue statin  GERD - Continue Pepcid  Code Status: Full code   Family Communication: Discussed in detail with the patient, all imaging results, lab results explained to the patient     Disposition Plan: Hopefully DC home in a.m. if improving  Time Spent in minutes 25 minutes  Procedures  None   Consults   None   DVT Prophylaxis  Lovenox   Medications  Scheduled Meds: . albuterol  2.5 mg Nebulization Q4H  . amoxicillin-clavulanate  1 tablet Oral Q12H  . aspirin EC  81 mg Oral q morning - 10a  . atorvastatin  40 mg Oral q morning - 10a  . benzonatate  100 mg Oral TID  . enoxaparin (LOVENOX) injection  40 mg Subcutaneous QHS  . esomeprazole   40 mg Oral QAC breakfast  . estradiol  1 mg Oral q morning - 10a  . irbesartan  300 mg Oral Daily   And  . hydrochlorothiazide  25 mg Oral Daily  . insulin aspart  0-5 Units Subcutaneous QHS  . insulin aspart  0-9 Units Subcutaneous TID WC  . methylPREDNISolone (SOLU-MEDROL) injection  60 mg Intravenous Q6H  . mometasone-formoterol  2 puff Inhalation BID  . potassium chloride SA  20 mEq Oral q morning - 10a   Continuous Infusions:  PRN Meds:.albuterol, ALPRAZolam, guaiFENesin-codeine   Antibiotics   Anti-infectives    Start     Dose/Rate Route Frequency Ordered Stop   07/22/15 1000  amoxicillin-clavulanate (AUGMENTIN) 875-125 MG per tablet 1 tablet     1 tablet Oral Every 12 hours 07/22/15 0920          Subjective:   [name] was seen and examined today. Complaining of left ear pain, no fevers or chills, wheezing is improving. Patient denies dizziness, chest pain, abdominal pain, N/V/D/C,  new weakness, numbess, tingling. No acute events overnight.    Objective:   Filed Vitals:   07/22/15 [id number] 07/22/15 0524 07/22/15 0558 07/22/15 0807  BP:   166/87   Pulse:  91 100   Temp:   97.6 F (36.4 C)   TempSrc:   Oral   Resp:  24 18   Height:      Weight:      SpO2: 98%  98% 94%    Intake/Output Summary (Last 24 hours) at 07/22/15 1206 Last data filed at 07/22/15 1047  Gross per 24 hour  Intake    480 ml  Output   1300 ml  Net   -820 ml     Wt Readings from Last 3 Encounters:  07/21/15 74.299 kg (163 lb 12.8 oz)  02/23/15 70.308 kg (155 lb)  08/27/14 71.124 kg (156 lb 12.8 oz)     Exam  General: Alert and oriented x 3, NAD  HEENT:  PERRLA, EOMI, Anicteric Sclera, mucous membranes moist. Left ear with bogginess and mild discharge, tympanic membrane not clear due to cerumen  Neck: Supple, no JVD, no masses  CVS: S1 S2 auscultated, no rubs, murmurs or gallops. Regular rate and rhythm.  Respiratory: Diffuse expiratory wheezing bilaterally  Abdomen:  Soft, nontender, nondistended, + bowel sounds  Ext: no cyanosis clubbing or edema  Neuro: AAOx3, Cr N's II- XII. Strength 5/5 upper and lower extremities bilaterally  Skin: No rashes  Psych: Normal affect and demeanor, alert and oriented x3    Data Reviewed:  I have personally reviewed following labs and imaging studies  Micro Results No results found for this or any previous visit (from the past 240 hour(s)).  Radiology Reports Dg Chest Port 1 View  07/21/2015  CLINICAL DATA:  Shortness of breath and wheezing beginning this morning. History of asthma, diabetes, and hypertension. Former smoker. EXAM: PORTABLE CHEST 1 VIEW COMPARISON:  08/06/2014 FINDINGS: The cardiomediastinal silhouette is within normal limits. There is slight central airway thickening which is less than on the prior study. No focal airspace consolidation, edema, pleural effusion, or pneumothorax is identified. Mild thoracic spondylosis is noted. IMPRESSION: Decreased bronchitic changes.  No airspace consolidation. Electronically Signed   By: Logan Bores M.D.   On: 07/21/2015 17:50    CBC  Recent Labs Lab 07/21/15 1730  WBC 10.0  HGB 13.0  HCT 39.7  PLT 324  MCV 92.5  MCH 30.3  MCHC 32.7  RDW 13.3    Chemistries   Recent Labs Lab 07/21/15 1730 07/22/15 0742  NA 137 138  K 3.1* 3.6  CL 99* 104  CO2 25 23  GLUCOSE 137* 200*  BUN 12 11  CREATININE 0.91 0.91  CALCIUM 9.6 8.7*   ------------------------------------------------------------------------------------------------------------------ estimated creatinine clearance is 58.1 mL/min (by C-G formula based on Cr of 0.91). ------------------------------------------------------------------------------------------------------------------ No results for input(s): HGBA1C in the last 72 hours. ------------------------------------------------------------------------------------------------------------------ No results for input(s): CHOL, HDL, LDLCALC,  TRIG, CHOLHDL, LDLDIRECT in the last 72 hours. ------------------------------------------------------------------------------------------------------------------ No results for input(s): TSH, T4TOTAL, T3FREE, THYROIDAB in the last 72 hours.  Invalid input(s): FREET3 ------------------------------------------------------------------------------------------------------------------ No results for input(s): VITAMINB12, FOLATE, FERRITIN, TIBC, IRON, RETICCTPCT in the last 72 hours.  Coagulation profile No results for input(s): INR, PROTIME in the last 168 hours.  No results for input(s): DDIMER in the last 72 hours.  Cardiac Enzymes  Recent Labs Lab 07/21/15 1710  TROPONINI <0.03   ------------------------------------------------------------------------------------------------------------------ Invalid input(s): POCBNP   Recent Labs  07/21/15 2356 07/22/15 0848  GLUCAP  240* 167*     RAI,RIPUDEEP M.D. [facility] 07/22/2015, 12:06 PM  Pager: [phone] Between 7am to 7pm - call Pager - [phone]  After 7pm go to [email or web address] - password [email or web address]  Call night coverage person covering after 7pm

## 2015-07-22 NOTE — Care Management Note (Signed)
Case Management Note  Patient Details  Name: CRISS DISPENZA MRN: [id number] Date of Birth: Apr 27, 1951  Subjective/Objective:  65 y.o F admitted 07/21/2015 with Asthma Exacerbation.                   Action/Plan: Anticipate discharge home Friday 07/23/2015 if continues to improve. . No further CM needs but will be available should additional discharge needs arise.   Expected Discharge Date:                  Expected Discharge Plan:     In-House Referral:     Discharge planning Services     Post Acute Care Choice:    Choice offered to:     DME Arranged:    DME Agency:     HH Arranged:    [place] Agency:     Status of Service:     Medicare Important Message Given:    Date Medicare IM Given:    Medicare IM give by:    Date Additional Medicare IM Given:    Additional Medicare Important Message give by:     If discussed at [place] of Stay Meetings, dates discussed:    Additional Comments:  Delrae Sawyers, RN 07/22/2015, 3:14 PM

## 2015-07-23 LAB — BASIC METABOLIC PANEL
Anion gap: 11 (ref 5–15)
BUN: 17 mg/dL (ref 6–20)
CALCIUM: 8.2 mg/dL — AB (ref 8.9–10.3)
CHLORIDE: 104 mmol/L (ref 101–111)
CO2: 23 mmol/L (ref 22–32)
CREATININE: 0.96 mg/dL (ref 0.44–1.00)
GFR calc Af Amer: >60 mL/min (ref >60)
GFR calc non Af Amer: >60 mL/min (ref >60)
GLUCOSE: 225 mg/dL — AB (ref 65–99)
Potassium: 3 mmol/L — ABNORMAL LOW (ref 3.5–5.1)
Sodium: 138 mmol/L (ref 135–145)

## 2015-07-23 LAB — GLUCOSE, CAPILLARY
Glucose-Capillary: 192 mg/dL — ABNORMAL HIGH (ref 65–99)
Glucose-Capillary: 251 mg/dL — ABNORMAL HIGH (ref 65–99)
Glucose-Capillary: 212 mg/dL — ABNORMAL HIGH (ref 65–99)
Glucose-Capillary: 243 mg/dL — ABNORMAL HIGH (ref 65–99)

## 2015-07-23 LAB — CBC
HCT: 33.7 % — ABNORMAL LOW (ref 36.0–46.0)
Hemoglobin: 11.3 g/dL — ABNORMAL LOW (ref 12.0–15.0)
MCH: 31.4 pg (ref 26.0–34.0)
MCHC: 33.5 g/dL (ref 30.0–36.0)
MCV: 93.6 fL (ref 78.0–100.0)
PLATELETS: 301 10*3/uL (ref 150–400)
RBC: 3.6 MIL/uL — AB (ref 3.87–5.11)
RDW: 13.5 % (ref 11.5–15.5)
WBC: 14.5 10*3/uL — ABNORMAL HIGH (ref 4.0–10.5)

## 2015-07-23 LAB — HEMOGLOBIN A1C
HEMOGLOBIN A1C: 6.4 % — AB (ref 4.8–5.6)
Mean Plasma Glucose: 137 mg/dL

## 2015-07-23 MED ORDER — IPRATROPIUM BROMIDE 0.02 % IN SOLN
0.5000 mg | Freq: Once | RESPIRATORY_TRACT | Status: AC
  Administered 2015-07-23: 0.5 mg via RESPIRATORY_TRACT
  Filled 2015-07-23: qty 2.5

## 2015-07-23 MED ORDER — POTASSIUM CHLORIDE CRYS ER 20 MEQ PO TBCR
40.0000 meq | EXTENDED_RELEASE_TABLET | Freq: Once | ORAL | Status: AC
  Administered 2015-07-23: 40 meq via ORAL
  Filled 2015-07-23: qty 2

## 2015-07-23 MED ORDER — IPRATROPIUM-ALBUTEROL 0.5-2.5 (3) MG/3ML IN SOLN
3.0000 mL | RESPIRATORY_TRACT | Status: DC
  Administered 2015-07-23 – 2015-07-24 (×9): 3 mL via RESPIRATORY_TRACT
  Filled 2015-07-23 (×9): qty 3

## 2015-07-23 MED ORDER — ALBUTEROL SULFATE (2.5 MG/3ML) 0.083% IN NEBU
5.0000 mg | INHALATION_SOLUTION | Freq: Once | RESPIRATORY_TRACT | Status: AC
  Administered 2015-07-23: 5 mg via RESPIRATORY_TRACT
  Filled 2015-07-23: qty 6

## 2015-07-23 MED ORDER — LEVOFLOXACIN 750 MG PO TABS
750.0000 mg | ORAL_TABLET | Freq: Every day | ORAL | Status: DC
  Administered 2015-07-23 – 2015-07-30 (×8): 750 mg via ORAL
  Filled 2015-07-23 (×9): qty 1

## 2015-07-23 MED ORDER — LORAZEPAM 2 MG/ML IJ SOLN
0.2500 mg | Freq: Once | INTRAMUSCULAR | Status: AC
  Administered 2015-07-23: 0.25 mg via INTRAVENOUS
  Filled 2015-07-23: qty 1

## 2015-07-23 MED ORDER — GUAIFENESIN-CODEINE 100-10 MG/5ML PO SOLN
5.0000 mL | ORAL | Status: DC | PRN
Start: 2015-07-23 — End: 2015-07-31
  Administered 2015-07-23 – 2015-07-24 (×2): 5 mL via ORAL
  Filled 2015-07-23 (×2): qty 5

## 2015-07-23 MED ORDER — INSULIN GLARGINE 100 UNIT/ML ~~LOC~~ SOLN
8.0000 [IU] | Freq: Every day | SUBCUTANEOUS | Status: DC
  Administered 2015-07-23 – 2015-07-30 (×9): 8 [IU] via SUBCUTANEOUS
  Filled 2015-07-23 (×9): qty 0.08

## 2015-07-23 MED ORDER — BENZONATATE 100 MG PO CAPS
200.0000 mg | ORAL_CAPSULE | Freq: Three times a day (TID) | ORAL | Status: DC
  Administered 2015-07-23 – 2015-07-31 (×25): 200 mg via ORAL
  Filled 2015-07-23 (×26): qty 2

## 2015-07-23 NOTE — Progress Notes (Signed)
Notified Ferdinand Cava NP in regards to patient having SOB and chest tightness despite Albuterol dose. See MAR for new orders. Advised to give provider call in regards to effectiveness of treatment

## 2015-07-23 NOTE — Progress Notes (Signed)
[facility]                                                                              Patient Demographics  [name], is a 65 y.o. female, DOB - 1950/07/03, [id number]  Admit date - 07/21/2015   Admitting Physician Etta Quill, DO  Outpatient Primary MD for the patient is [name], MD  LOS - 1  days    Chief Complaint  Patient presents with  . Asthma       Brief HPI   65 y.o. female who presents to the ED with c/o SOB. She has an extensive history of asthma in the past as well. This morning she developed onset of SOB with wheezing. Feels typical for asthma exacerbation. Used inhaler but symptoms continued to progress throughout the day.   Assessment & Plan    Active Problems:   Asthma exacerbation - still very wheezing today, received prolonged albuterol neb - Continue scheduled DuoNeb's, added Atrovent q4hrs,dulera - Continue IV Solu-Medrol - Placed on Levaquin, Claritin, cough suppressants with the Tessalon and Robitussin with codeine - Influenza panel negative  Left ear pain - Left ear with pain and mild discharge on ear examination, placed on levaquin and Claritin  Diabetes mellitus type II uncontrolled - Hemoglobin A1c 6.4, CBGs likely worsened due to steroids.  - Continue siding scale insulin, added Lantus   Hypertension - Currently stable, continue Avapro, HCTZ  Hyperlipidemia - Continue statin  GERD - Continue Pepcid  Code Status: Full code   Family Communication: Discussed in detail with the patient, all imaging results, lab results explained to the patient     Disposition Plan: DC likely 24-48 hours if improving  Time Spent in minutes 25 minutes  Procedures  None   Consults   None   DVT Prophylaxis  Lovenox   Medications  Scheduled Meds: . aspirin EC  81 mg Oral q morning - 10a  . atorvastatin  40 mg Oral q morning - 10a  . benzonatate  200 mg Oral TID  . enoxaparin (LOVENOX) injection  40 mg  Subcutaneous QHS  . esomeprazole  40 mg Oral QAC breakfast  . estradiol  1 mg Oral q morning - 10a  . famotidine  20 mg Oral Daily  . irbesartan  300 mg Oral Daily   And  . hydrochlorothiazide  25 mg Oral Daily  . insulin aspart  0-5 Units Subcutaneous QHS  . insulin aspart  0-9 Units Subcutaneous TID WC  . ipratropium-albuterol  3 mL Nebulization Q4H  . levofloxacin  750 mg Oral Daily  . loratadine  10 mg Oral Daily  . methylPREDNISolone (SOLU-MEDROL) injection  60 mg Intravenous Q6H  . mometasone-formoterol  2 puff Inhalation BID  . potassium chloride SA  20 mEq Oral q morning - 10a   Continuous Infusions:  PRN Meds:.albuterol, ALPRAZolam, guaiFENesin-codeine, HYDROcodone-acetaminophen, HYDROmorphone (DILAUDID) injection   Antibiotics   Anti-infectives    Start     Dose/Rate Route Frequency Ordered Stop   07/23/15 1000  levofloxacin (LEVAQUIN) tablet 750 mg     750 mg Oral Daily 07/23/15 0936     07/22/15  1000  amoxicillin-clavulanate (AUGMENTIN) 875-125 MG per tablet 1 tablet  Status:  Discontinued     1 tablet Oral Every 12 hours 07/22/15 0920 07/23/15 [id number]        Subjective:   [name] was seen and examined today. Still wheezing and coughing. No fevers or chills. Left ear pain improving.  Patient denies dizziness, chest pain, abdominal pain, N/V/D/C, new weakness, numbess, tingling. No acute events overnight.    Objective:   Filed Vitals:   07/23/15 0233 07/23/15 0530 07/23/15 0801 07/23/15 1120  BP:  159/70    Pulse:      Temp:  97.8 F (36.6 C)    TempSrc:  Oral    Resp:  18    Height:      Weight:      SpO2: 98%  99% 97%    Intake/Output Summary (Last 24 hours) at 07/23/15 1239 Last data filed at 07/23/15 0900  Gross per 24 hour  Intake    360 ml  Output      0 ml  Net    360 ml     Wt Readings from Last 3 Encounters:  07/21/15 74.299 kg (163 lb 12.8 oz)  02/23/15 70.308 kg (155 lb)  08/27/14 71.124 kg (156 lb 12.8 oz)      Exam  General: Alert and oriented x 3, NAD  HEENT:    Neck:  CVS: S1 S2  clear, RRR   Respiratory: Diffuse expiratory wheezing bilaterally  Abdomen: Soft, nontender, nondistended, + bowel sounds  Ext: no cyanosis clubbing or edema  Neuro: no new deficits   Skin: No rashes  Psych: Normal affect and demeanor, alert and oriented x3    Data Reviewed:  I have personally reviewed following labs and imaging studies  Micro Results No results found for this or any previous visit (from the past 240 hour(s)).  Radiology Reports Dg Chest Port 1 View  07/21/2015  CLINICAL DATA:  Shortness of breath and wheezing beginning this morning. History of asthma, diabetes, and hypertension. Former smoker. EXAM: PORTABLE CHEST 1 VIEW COMPARISON:  08/06/2014 FINDINGS: The cardiomediastinal silhouette is within normal limits. There is slight central airway thickening which is less than on the prior study. No focal airspace consolidation, edema, pleural effusion, or pneumothorax is identified. Mild thoracic spondylosis is noted. IMPRESSION: Decreased bronchitic changes.  No airspace consolidation. Electronically Signed   By: Logan Bores M.D.   On: 07/21/2015 17:50    CBC  Recent Labs Lab 07/21/15 1730 07/23/15 0736  WBC 10.0 14.5*  HGB 13.0 11.3*  HCT 39.7 33.7*  PLT 324 301  MCV 92.5 93.6  MCH 30.3 31.4  MCHC 32.7 33.5  RDW 13.3 13.5    Chemistries   Recent Labs Lab 07/21/15 1730 07/22/15 0742 07/23/15 0736  NA 137 138 138  K 3.1* 3.6 3.0*  CL 99* 104 104  CO2 25 23 23   GLUCOSE 137* 200* 225*  BUN 12 11 17   CREATININE 0.91 0.91 0.96  CALCIUM 9.6 8.7* 8.2*   ------------------------------------------------------------------------------------------------------------------ estimated creatinine clearance is 55.1 mL/min (by C-G formula based on Cr of  0.96). ------------------------------------------------------------------------------------------------------------------  Recent Labs  07/22/15 1035  HGBA1C 6.4*   ------------------------------------------------------------------------------------------------------------------ No results for input(s): CHOL, HDL, LDLCALC, TRIG, CHOLHDL, LDLDIRECT in the last 72 hours. ------------------------------------------------------------------------------------------------------------------ No results for input(s): TSH, T4TOTAL, T3FREE, THYROIDAB in the last 72 hours.  Invalid input(s): FREET3 ------------------------------------------------------------------------------------------------------------------ No results for input(s): VITAMINB12, FOLATE, FERRITIN, TIBC, IRON, RETICCTPCT in the last 72 hours.  Coagulation  profile No results for input(s): INR, PROTIME in the last 168 hours.  No results for input(s): DDIMER in the last 72 hours.  Cardiac Enzymes  Recent Labs Lab 07/21/15 1710  TROPONINI <0.03   ------------------------------------------------------------------------------------------------------------------ Invalid input(s): POCBNP   Recent Labs  07/22/15 0848 07/22/15 1153 07/22/15 1703 07/22/15 2147 07/23/15 0806 07/23/15 1144  GLUCAP 167* 245* 274* 252* 192* 251*     Zehra Rucci M.D. [facility] 07/23/2015, 12:39 PM  Pager: [phone] Between 7am to 7pm - call Pager - [phone]  After 7pm go to [email or web address] - password [email or web address]  Call night coverage person covering after 7pm

## 2015-07-24 ENCOUNTER — Inpatient Hospital Stay (HOSPITAL_COMMUNITY): Payer: Medicare Other

## 2015-07-24 DIAGNOSIS — E1165 Type 2 diabetes mellitus with hyperglycemia: Secondary | ICD-10-CM

## 2015-07-24 LAB — CBC
HEMATOCRIT: 36.2 % (ref 36.0–46.0)
HEMOGLOBIN: 11.7 g/dL — AB (ref 12.0–15.0)
MCH: 30.3 pg (ref 26.0–34.0)
MCHC: 32.3 g/dL (ref 30.0–36.0)
MCV: 93.8 fL (ref 78.0–100.0)
Platelets: 323 10*3/uL (ref 150–400)
RBC: 3.86 MIL/uL — ABNORMAL LOW (ref 3.87–5.11)
RDW: 13.6 % (ref 11.5–15.5)
WBC: 16 10*3/uL — ABNORMAL HIGH (ref 4.0–10.5)

## 2015-07-24 LAB — BASIC METABOLIC PANEL
Anion gap: 11 (ref 5–15)
BUN: 19 mg/dL (ref 6–20)
CHLORIDE: 103 mmol/L (ref 101–111)
CO2: 25 mmol/L (ref 22–32)
CREATININE: 0.97 mg/dL (ref 0.44–1.00)
Calcium: 8.4 mg/dL — ABNORMAL LOW (ref 8.9–10.3)
GFR calc non Af Amer: 60 mL/min — ABNORMAL LOW (ref >60)
GLUCOSE: 238 mg/dL — AB (ref 65–99)
Potassium: 3.2 mmol/L — ABNORMAL LOW (ref 3.5–5.1)
Sodium: 139 mmol/L (ref 135–145)

## 2015-07-24 LAB — GLUCOSE, CAPILLARY
GLUCOSE-CAPILLARY: 217 mg/dL — AB (ref 65–99)
GLUCOSE-CAPILLARY: 235 mg/dL — AB (ref 65–99)
GLUCOSE-CAPILLARY: 230 mg/dL — AB (ref 65–99)
Glucose-Capillary: 253 mg/dL — ABNORMAL HIGH (ref 65–99)

## 2015-07-24 MED ORDER — BUDESONIDE 0.25 MG/2ML IN SUSP
0.2500 mg | Freq: Two times a day (BID) | RESPIRATORY_TRACT | Status: DC
  Administered 2015-07-24 – 2015-07-26 (×5): 0.25 mg via RESPIRATORY_TRACT
  Filled 2015-07-24 (×4): qty 2

## 2015-07-24 MED ORDER — HYDRALAZINE HCL 20 MG/ML IJ SOLN
10.0000 mg | Freq: Four times a day (QID) | INTRAMUSCULAR | Status: DC | PRN
  Administered 2015-07-24 – 2015-07-26 (×6): 10 mg via INTRAVENOUS
  Filled 2015-07-24 (×6): qty 1

## 2015-07-24 MED ORDER — ALBUTEROL (5 MG/ML) CONTINUOUS INHALATION SOLN: 15.0000 mg/h | INHALATION_SOLUTION | RESPIRATORY_TRACT | Status: DC

## 2015-07-24 MED ORDER — HYDROCOD POLST-CPM POLST ER 10-8 MG/5ML PO SUER
5.0000 mL | Freq: Two times a day (BID) | ORAL | Status: DC
  Administered 2015-07-24 – 2015-07-31 (×15): 5 mL via ORAL
  Filled 2015-07-24 (×15): qty 5

## 2015-07-24 MED ORDER — IPRATROPIUM-ALBUTEROL 0.5-2.5 (3) MG/3ML IN SOLN: 3.0000 mL | Freq: Four times a day (QID) | RESPIRATORY_TRACT | Status: DC

## 2015-07-24 MED ORDER — IPRATROPIUM-ALBUTEROL 0.5-2.5 (3) MG/3ML IN SOLN
3.0000 mL | RESPIRATORY_TRACT | Status: DC
  Administered 2015-07-24 – 2015-07-26 (×11): 3 mL via RESPIRATORY_TRACT
  Filled 2015-07-24 (×9): qty 3

## 2015-07-24 MED ORDER — POTASSIUM CHLORIDE CRYS ER 20 MEQ PO TBCR
20.0000 meq | EXTENDED_RELEASE_TABLET | Freq: Once | ORAL | Status: AC
  Administered 2015-07-24: 20 meq via ORAL
  Filled 2015-07-24: qty 1

## 2015-07-24 NOTE — Progress Notes (Signed)
Pt continues to c/o shortness of breath and chest pain. Pt reports chest pain  With movement and coughing. Expiratory wheeze on auscultation unrelieved by prn breathing rx. EKG done with NSR finding. On-call provider Fredirick Maudlin, NP  made aware and order received for STAT CXR, troponin, and breathing  rx. Rapid Response Nurse also made aware of change of condition. Will monitor.

## 2015-07-24 NOTE — Progress Notes (Signed)
[facility] PROGRESS NOTE  [name] [id number] DOB: 1950/09/25 DOA: 07/21/2015  PCP: [name], MD  Brief HPI: [name] with a past medical history of asthma, presented with complaints of shortness of breath. Patient was thought to have asthma exacerbation and she was hospitalized for further management.  Past medical history:  Past Medical History  Diagnosis Date  . Asthma   . Diabetes mellitus   . Hypertension   . Dyslipidemia   . Pneumonia   . Bronchitis   . GERD (gastroesophageal reflux disease)     Consultants: None  Procedures: None  Antibiotics: Levaquin  Subjective: Patient feels better compared to how she was when she was initially hospitalized, but hasn't improved much compared to yesterday. Continues to have wheezing. Continues to cough excessively resulting in pain in her chest. Mostly dry cough.  Objective: Vital Signs  Filed Vitals:   07/24/15 0432 07/24/15 0446 07/24/15 0624 07/24/15 0800  BP:  175/75 157/63   Pulse: 86 113 95   Temp:  98.2 F (36.8 C)    TempSrc:  Oral    Resp: 18 18    Height:      Weight:      SpO2: 97% 99% 100% 98%    Intake/Output Summary (Last 24 hours) at 07/24/15 1151 Last data filed at 07/24/15 1024  Gross per 24 hour  Intake    420 ml  Output      0 ml  Net    420 ml   Filed Weights   07/21/15 2303  Weight: 74.299 kg (163 lb 12.8 oz)    General appearance: alert, cooperative, appears stated age and no distress Resp: End expiratory wheezing heard bilaterally. No crackles. No rhonchi. tachypnea is present. Cardio: S1, S2 is tachycardic, regular. No S3, S4. No rubs, murmurs, or bruit. No pedal edema. GI: soft, non-tender; bowel sounds normal; no masses,  no organomegaly Extremities: extremities normal, atraumatic, no cyanosis or edema  Lab Results:  Basic Metabolic Panel:  Recent Labs Lab 07/21/15 1730 07/22/15 0742 07/23/15 0736 07/24/15 0439  NA 137 138  138 139  K 3.1* 3.6 3.0* 3.2*  CL 99* 104 104 103  CO2 25 23 23 25   GLUCOSE 137* 200* 225* 238*  BUN 12 11 17 19   CREATININE 0.91 0.91 0.96 0.97  CALCIUM 9.6 8.7* 8.2* 8.4*   CBC:  Recent Labs Lab 07/21/15 1730 07/23/15 0736 07/24/15 0439  WBC 10.0 14.5* 16.0*  HGB 13.0 11.3* 11.7*  HCT 39.7 33.7* 36.2  MCV 92.5 93.6 93.8  PLT 324 301 323   Cardiac Enzymes:  Recent Labs Lab 07/21/15 1710  TROPONINI <0.03   BNP (last 3 results)  Recent Labs  07/21/15 1824  BNP 11.8    CBG:  Recent Labs Lab 07/23/15 0806 07/23/15 1144 07/23/15 1636 07/23/15 2111 07/24/15 0806  GLUCAP 192* 251* 212* 243* 253*    No results found for this or any previous visit (from the past 240 hour(s)).    Studies/Results: No results found.  Medications:  Scheduled: . aspirin EC  81 mg Oral q morning - 10a  . atorvastatin  40 mg Oral q morning - 10a  . benzonatate  200 mg Oral TID  . budesonide (PULMICORT) nebulizer solution  0.25 mg Nebulization BID  . chlorpheniramine-HYDROcodone  5 mL Oral Q12H  . enoxaparin (LOVENOX) injection  40 mg Subcutaneous QHS  . esomeprazole  40 mg Oral QAC breakfast  . estradiol  1 mg Oral q  morning - 10a  . famotidine  20 mg Oral Daily  . irbesartan  300 mg Oral Daily   And  . hydrochlorothiazide  25 mg Oral Daily  . insulin aspart  0-5 Units Subcutaneous QHS  . insulin aspart  0-9 Units Subcutaneous TID WC  . insulin glargine  8 Units Subcutaneous QHS  . ipratropium-albuterol  3 mL Nebulization Q4H  . levofloxacin  750 mg Oral Daily  . loratadine  10 mg Oral Daily  . methylPREDNISolone (SOLU-MEDROL) injection  60 mg Intravenous Q6H  . potassium chloride SA  20 mEq Oral q morning - 10a  . potassium chloride  20 mEq Oral Once   Continuous:  [id number], ALPRAZolam, guaiFENesin-codeine, hydrALAZINE, HYDROcodone-acetaminophen, HYDROmorphone (DILAUDID) injection  Assessment/Plan:  Active Problems:   DM (diabetes mellitus) type 2,  uncontrolled, with ketoacidosis ([place])   HYPERCHOLESTEROLEMIA   Essential hypertension   G E R D   Asthma exacerbation    Acute Asthma exacerbation Patient continues to wheeze. Patient is on maximal therapy for acute asthma exacerbation. Adding nebulized budesonide instead of Dulera might be beneficial. Otherwise, continue with nebulizer treatments, steroids, antibiotics. Antitussive agents. Also on board. Influenza panel was negative. Patient was reassured.  Left ear pain Left ear with pain and mild discharge noted by previous rounding physician. Patient was placed on antibiotics and Claritin. She feels better.   Diabetes mellitus type II uncontrolled Hemoglobin A1c 6.4, CBGs likely worsened due to steroids. Continue siding scale insulin. Added Lantus  Essential Hypertension Currently stable. Continue Avapro, HCTZ  Hyperlipidemia Continue statin  GERD Continue Pepcid  DVT Prophylaxis: Lovenox    Code Status: Full code  Family Communication: Discussed in detail with the patient  Disposition Plan: Continue treatment as outlined above. Wait for improvement.    LOS: 2 days   [place] [facility] Pager [phone] 07/24/2015, 11:51 AM  If 7PM-7AM, please contact night-coverage at [email or web address], password [facility]

## 2015-07-24 NOTE — Progress Notes (Signed)
Called to bedside due to increased respiratory effort and moderate distress. On arrival, patient was on 2L nasal cannula and showing tachypnea and slight accessory muscle usage. Auscultation revealed fine crackles on right side and expiratory wheezes throughout. Patient has been getting treatments Q2 due to increased SOB and wheezing. Suggested a continuous neb, but unable to do that while she is on the floor. Spoke with rapid and gave her PRN treatment following her regularly scheduled nebulizer. She states that she feels a little better. RT will continue to monitor.

## 2015-07-25 ENCOUNTER — Encounter (HOSPITAL_COMMUNITY): Payer: Self-pay | Admitting: Radiology

## 2015-07-25 ENCOUNTER — Inpatient Hospital Stay (HOSPITAL_COMMUNITY): Payer: Medicare Other

## 2015-07-25 ENCOUNTER — Encounter (HOSPITAL_COMMUNITY): Payer: Self-pay

## 2015-07-25 DIAGNOSIS — R079 Chest pain, unspecified: Secondary | ICD-10-CM

## 2015-07-25 DIAGNOSIS — R609 Edema, unspecified: Secondary | ICD-10-CM

## 2015-07-25 LAB — CBC
HEMATOCRIT: 35.1 % — AB (ref 36.0–46.0)
Hemoglobin: 12 g/dL (ref 12.0–15.0)
MCH: 31.7 pg (ref 26.0–34.0)
MCHC: 34.2 g/dL (ref 30.0–36.0)
MCV: 92.9 fL (ref 78.0–100.0)
Platelets: 281 10*3/uL (ref 150–400)
RBC: 3.78 MIL/uL — ABNORMAL LOW (ref 3.87–5.11)
RDW: 13.9 % (ref 11.5–15.5)
WBC: 14.1 10*3/uL — AB (ref 4.0–10.5)

## 2015-07-25 LAB — BASIC METABOLIC PANEL
ANION GAP: 10 (ref 5–15)
BUN: 18 mg/dL (ref 6–20)
CALCIUM: 8.4 mg/dL — AB (ref 8.9–10.3)
CHLORIDE: 98 mmol/L — AB (ref 101–111)
CO2: 28 mmol/L (ref 22–32)
CREATININE: 0.92 mg/dL (ref 0.44–1.00)
GFR calc Af Amer: >60 mL/min (ref >60)
GFR calc non Af Amer: >60 mL/min (ref >60)
Glucose, Bld: 249 mg/dL — ABNORMAL HIGH (ref 65–99)
Potassium: 3.5 mmol/L (ref 3.5–5.1)
Sodium: 136 mmol/L (ref 135–145)

## 2015-07-25 LAB — GLUCOSE, CAPILLARY
GLUCOSE-CAPILLARY: 204 mg/dL — AB (ref 65–99)
GLUCOSE-CAPILLARY: 303 mg/dL — AB (ref 65–99)
GLUCOSE-CAPILLARY: 283 mg/dL — AB (ref 65–99)
Glucose-Capillary: 207 mg/dL — ABNORMAL HIGH (ref 65–99)

## 2015-07-25 LAB — TROPONIN I
TROPONIN I: 0.11 ng/mL — AB (ref <0.031)
Troponin I: 0.13 ng/mL — ABNORMAL HIGH (ref <0.031)

## 2015-07-25 LAB — BRAIN NATRIURETIC PEPTIDE: B NATRIURETIC PEPTIDE 5: 49.6 pg/mL (ref 0.0–100.0)

## 2015-07-25 MED ORDER — SALINE SPRAY 0.65 % NA SOLN
1.0000 | NASAL | Status: DC | PRN
  Administered 2015-07-25: 1 via NASAL
  Filled 2015-07-25: qty 44

## 2015-07-25 MED ORDER — IOHEXOL 350 MG/ML SOLN
100.0000 mL | Freq: Once | INTRAVENOUS | Status: AC | PRN
  Administered 2015-07-25: 80 mL via INTRAVENOUS

## 2015-07-25 MED ORDER — FUROSEMIDE 10 MG/ML IJ SOLN
40.0000 mg | Freq: Once | INTRAMUSCULAR | Status: AC
  Administered 2015-07-25: 40 mg via INTRAVENOUS
  Filled 2015-07-25: qty 4

## 2015-07-25 NOTE — Progress Notes (Signed)
[facility] progress note. Chief complaint. Dyspnea, chest pain. History of present illness. This 65 year old female with a past history of asthma was admitted with complaints of shortness of breath. She complained to nursing early increased shortness of breath and wheezing. She was seen by respiratory therapy initiated extra when necessary nebulizer treatments. Patient also complained of chest pain located over the left breast. 12-lead EKG was obtained and indicates normal sinus rhythm. I see no changes consistent with an acute MI. Pain is reproducible with palpation above the left breast. A chest x-ray was also obtained and indicates no acute abnormality. Physical exam. Vital signs. Temperature 98.4, pulse 90, respiration 20, blood pressure 163/68. O2 sats 98%. General appearance. Well-developed female who is alert and in no distress. Slight cardiac. Rate and rhythm regular. Lungs. Breath sounds are clear but with expiratory wheezing noted in the upper airways. No distress and stable O2 sats. Abdomen. Soft with positive bowel sounds. Impression/plan. Problem #1 chest pain. Per review of EKG I see no suggestion of an acute MI. Nonetheless we'll obtain troponin now and then every 6 hours for a total of 3 sets. We'll repeat an EKG in 6 hours to evaluate for any changes. Problem #2. Dyspnea/wheezing. Much improved and clinically stable status post when necessary nebulizer treatments.

## 2015-07-25 NOTE — Progress Notes (Signed)
[facility] PROGRESS NOTE  [name] [id number] DOB: 1950/05/04 DOA: 07/21/2015  PCP: [name], MD  Brief HPI: [name] with a past medical history of asthma, presented with complaints of shortness of breath. Patient was thought to have asthma exacerbation and she was hospitalized for further management. Patient has been very slow to improve. On the night of 3/25 she developed worsening shortness of breath along with chest pain.  Past medical history:  Past Medical History  Diagnosis Date  . Asthma   . Diabetes mellitus   . Hypertension   . Dyslipidemia   . Pneumonia   . Bronchitis   . GERD (gastroesophageal reflux disease)     Consultants: None  Procedures: None  Antibiotics: Levaquin  Subjective: Patient states that she had a rough night. Her breathing got worse and she started having chest pain in the left side, mainly with deep breathing and coughing. Unable to describe the character of the pain. Denies any nausea, vomiting. Denies any history of heart disease. Feels a little better this morning after receiving multiple breathing treatments overnight.   Objective: Vital Signs  Filed Vitals:   07/25/15 0510 07/25/15 0629 07/25/15 1010 07/25/15 1011  BP: 178/77 146/63    Pulse: 95 90    Temp: 98.1 F (36.7 C)     TempSrc: Oral     Resp: 17     Height:      Weight:      SpO2: 100%  99% 98%    Intake/Output Summary (Last 24 hours) at 07/25/15 1015 Last data filed at 07/24/15 1859  Gross per 24 hour  Intake    580 ml  Output      0 ml  Net    580 ml   Filed Weights   07/21/15 2303  Weight: 74.299 kg (163 lb 12.8 oz)    General appearance: alert, cooperative, appears stated age and no distress Resp: Continues to have diffuse end expiratory wheezing bilaterally. No definite crackles or rhonchi. She is noted to be tachypneic.  Cardio: S1, S2 is slightly tachycardic, regular. No S3, S4. No rubs, murmurs, or  bruit. No pedal edema. GI: soft, non-tender; bowel sounds normal; no masses,  no organomegaly Extremities: extremities normal, atraumatic, no cyanosis or edema  Lab Results:  Basic Metabolic Panel:  Recent Labs Lab 07/21/15 1730 07/22/15 0742 07/23/15 0736 07/24/15 0439 07/25/15 0558  NA 137 138 138 139 136  K 3.1* 3.6 3.0* 3.2* 3.5  CL 99* 104 104 103 98*  CO2 25 23 23 25 28   GLUCOSE 137* 200* 225* 238* 249*  BUN 12 11 17 19 18   CREATININE 0.91 0.91 0.96 0.97 0.92  CALCIUM 9.6 8.7* 8.2* 8.4* 8.4*   CBC:  Recent Labs Lab 07/21/15 1730 07/23/15 0736 07/24/15 0439 07/25/15 0558  WBC 10.0 14.5* 16.0* 14.1*  HGB 13.0 11.3* 11.7* 12.0  HCT 39.7 33.7* 36.2 35.1*  MCV 92.5 93.6 93.8 92.9  PLT 324 301 323 281   Cardiac Enzymes:  Recent Labs Lab 07/21/15 1710 07/24/15 2344 07/25/15 0558  TROPONINI <0.03 <0.03 0.11*   BNP (last 3 results)  Recent Labs  07/21/15 1824  BNP 11.8    CBG:  Recent Labs Lab 07/24/15 0806 07/24/15 1221 07/24/15 1712 07/24/15 2130 07/25/15 0807  GLUCAP 253* 217* 235* 230* 204*    No results found for this or any previous visit (from the past 240 hour(s)).    Studies/Results: Dg Chest Port 1 View  07/24/2015  CLINICAL DATA:  65 year old female with dyspnea EXAM: PORTABLE CHEST 1 VIEW COMPARISON:  Radiograph dated 07/21/2015 FINDINGS: The heart size and mediastinal contours are within normal limits. Both lungs are clear. The visualized skeletal structures are unremarkable. IMPRESSION: No active disease. Electronically Signed   By: Anner Crete M.D.   On: 07/24/2015 23:36    Medications:  Scheduled: . aspirin EC  81 mg Oral q morning - 10a  . atorvastatin  40 mg Oral q morning - 10a  . benzonatate  200 mg Oral TID  . budesonide (PULMICORT) nebulizer solution  0.25 mg Nebulization BID  . chlorpheniramine-HYDROcodone  5 mL Oral Q12H  . enoxaparin (LOVENOX) injection  40 mg Subcutaneous QHS  . esomeprazole  40 mg Oral QAC  breakfast  . estradiol  1 mg Oral q morning - 10a  . famotidine  20 mg Oral Daily  . irbesartan  300 mg Oral Daily   And  . hydrochlorothiazide  25 mg Oral Daily  . insulin aspart  0-5 Units Subcutaneous QHS  . insulin aspart  0-9 Units Subcutaneous TID WC  . insulin glargine  8 Units Subcutaneous QHS  . ipratropium-albuterol  3 mL Nebulization Q4H  . levofloxacin  750 mg Oral Daily  . loratadine  10 mg Oral Daily  . methylPREDNISolone (SOLU-MEDROL) injection  60 mg Intravenous Q6H  . potassium chloride SA  20 mEq Oral q morning - 10a   Continuous:  [id number], ALPRAZolam, guaiFENesin-codeine, hydrALAZINE, HYDROcodone-acetaminophen, HYDROmorphone (DILAUDID) injection  Assessment/Plan:  Active Problems:   DM (diabetes mellitus) type 2, uncontrolled, with ketoacidosis ([place])   HYPERCHOLESTEROLEMIA   Essential hypertension   G E R D   Asthma exacerbation    Acute Asthma exacerbation Patient is very slow to improve. She is on maximal treatment for asthma exacerbation. She is on nebulized budesonide. Continue current treatment for now including antibiotics. She is also on antitussive agent. Influenza panel was negative. Check peak flows. Consider pulmonology input if no improvement.  Chest Pain/Elevated trop Chest pain is most likely secondary to her asthma. However, troponin is noted to be mildly elevated. EKG was reviewed. It shows T inversion in the inferior leads. This has been seen previously during this hospitalization as well. Chest x-ray does not show any acute findings. Some of her chest pain features are pleuritic in nature. Proceed with CT angiogram of the chest for now. Continue to trend troponin. Elevated troponin could be due to demand. She is noted to have bilateral pedal edema. Lower extremity venous Doppler. Lasix can be considered depending on findings on CT scan.  Left ear pain Left ear with pain and mild discharge noted by previous rounding physician. Patient was  placed on antibiotics and Claritin. She feels better.   Diabetes mellitus type II uncontrolled Hemoglobin A1c 6.4. CBGs likely worsened due to steroids. Continue siding scale insulin. Continue Lantus, which was initiated in the hospital.  Essential Hypertension Currently stable. Continue Avapro, HCTZ  Hyperlipidemia Continue statin  GERD Continue Pepcid  DVT Prophylaxis: Lovenox    Code Status: Full code  Family Communication: Discussed in detail with the patient  Disposition Plan: Await CT angiogram of the chest. Continue treatment as outlined above.    LOS: 3 days   [facility] Pager [phone] 07/25/2015, 10:15 AM  If 7PM-7AM, please contact night-coverage at [email or web address], password [facility]

## 2015-07-25 NOTE — Progress Notes (Signed)
Paged Dr [place] Pink regarding pts last 2  troponins being elevated (0.11) & (0.13). Pt denies chest pain at this time. Continuing to monitor pt.

## 2015-07-25 NOTE — Progress Notes (Signed)
*  PRELIMINARY RESULTS* Vascular Ultrasound Lower extremity venous duplex has been completed.  Preliminary findings: No evidence of DVT or baker's cyst.   Landry Mellow, RDMS, RVT  07/25/2015, 2:04 PM

## 2015-07-26 ENCOUNTER — Inpatient Hospital Stay (HOSPITAL_COMMUNITY): Payer: Medicare Other

## 2015-07-26 DIAGNOSIS — J45901 Unspecified asthma with (acute) exacerbation: Principal | ICD-10-CM

## 2015-07-26 DIAGNOSIS — R0781 Pleurodynia: Secondary | ICD-10-CM

## 2015-07-26 DIAGNOSIS — R079 Chest pain, unspecified: Secondary | ICD-10-CM

## 2015-07-26 LAB — CBC
HCT: 36 % (ref 36.0–46.0)
Hemoglobin: 11.7 g/dL — ABNORMAL LOW (ref 12.0–15.0)
MCH: 30 pg (ref 26.0–34.0)
MCHC: 32.5 g/dL (ref 30.0–36.0)
MCV: 92.3 fL (ref 78.0–100.0)
PLATELETS: 319 10*3/uL (ref 150–400)
RBC: 3.9 MIL/uL (ref 3.87–5.11)
RDW: 14 % (ref 11.5–15.5)
WBC: 14.3 10*3/uL — ABNORMAL HIGH (ref 4.0–10.5)

## 2015-07-26 LAB — BASIC METABOLIC PANEL
Anion gap: 12 (ref 5–15)
BUN: 24 mg/dL — AB (ref 6–20)
CO2: 30 mmol/L (ref 22–32)
Calcium: 8.7 mg/dL — ABNORMAL LOW (ref 8.9–10.3)
Chloride: 95 mmol/L — ABNORMAL LOW (ref 101–111)
Creatinine, Ser: 1.09 mg/dL — ABNORMAL HIGH (ref 0.44–1.00)
GFR, EST AFRICAN AMERICAN: 60 mL/min — AB (ref >60)
GFR, EST NON AFRICAN AMERICAN: 52 mL/min — AB (ref >60)
Glucose, Bld: 245 mg/dL — ABNORMAL HIGH (ref 65–99)
Potassium: 3.4 mmol/L — ABNORMAL LOW (ref 3.5–5.1)
SODIUM: 137 mmol/L (ref 135–145)

## 2015-07-26 LAB — ECHOCARDIOGRAM COMPLETE
HEIGHTINCHES: 62.5 in
WEIGHTICAEL: 2620.8 [oz_av]

## 2015-07-26 LAB — GLUCOSE, CAPILLARY
GLUCOSE-CAPILLARY: 260 mg/dL — AB (ref 65–99)
GLUCOSE-CAPILLARY: 216 mg/dL — AB (ref 65–99)
Glucose-Capillary: 235 mg/dL — ABNORMAL HIGH (ref 65–99)
Glucose-Capillary: 208 mg/dL — ABNORMAL HIGH (ref 65–99)

## 2015-07-26 LAB — TROPONIN I: TROPONIN I: 0.07 ng/mL — AB (ref <0.031)

## 2015-07-26 MED ORDER — HYDRALAZINE HCL 20 MG/ML IJ SOLN
20.0000 mg | Freq: Four times a day (QID) | INTRAMUSCULAR | Status: DC | PRN
  Administered 2015-07-26 – 2015-07-30 (×5): 20 mg via INTRAVENOUS
  Filled 2015-07-26 (×5): qty 1

## 2015-07-26 MED ORDER — FAMOTIDINE 20 MG PO TABS
20.0000 mg | ORAL_TABLET | Freq: Two times a day (BID) | ORAL | Status: DC
  Administered 2015-07-26 – 2015-07-31 (×10): 20 mg via ORAL
  Filled 2015-07-26 (×10): qty 1

## 2015-07-26 MED ORDER — IPRATROPIUM-ALBUTEROL 0.5-2.5 (3) MG/3ML IN SOLN
3.0000 mL | Freq: Four times a day (QID) | RESPIRATORY_TRACT | Status: DC
  Administered 2015-07-26 – 2015-07-27 (×3): 3 mL via RESPIRATORY_TRACT
  Filled 2015-07-26 (×3): qty 3

## 2015-07-26 MED ORDER — INSULIN GLARGINE 100 UNIT/ML ~~LOC~~ SOLN
10.0000 [IU] | Freq: Every day | SUBCUTANEOUS | Status: DC
  Administered 2015-07-26 – 2015-07-31 (×6): 10 [IU] via SUBCUTANEOUS
  Filled 2015-07-26 (×6): qty 0.1

## 2015-07-26 MED ORDER — BUDESONIDE 0.5 MG/2ML IN SUSP
0.5000 mg | Freq: Two times a day (BID) | RESPIRATORY_TRACT | Status: DC
  Administered 2015-07-26 – 2015-07-31 (×9): 0.5 mg via RESPIRATORY_TRACT
  Filled 2015-07-26 (×11): qty 2

## 2015-07-26 MED ORDER — POTASSIUM CHLORIDE CRYS ER 20 MEQ PO TBCR
40.0000 meq | EXTENDED_RELEASE_TABLET | Freq: Once | ORAL | Status: AC
  Administered 2015-07-26: 40 meq via ORAL
  Filled 2015-07-26: qty 2

## 2015-07-26 MED ORDER — HYDRALAZINE HCL 20 MG/ML IJ SOLN
10.0000 mg | Freq: Once | INTRAMUSCULAR | Status: AC
  Administered 2015-07-26: 10 mg via INTRAVENOUS
  Filled 2015-07-26: qty 1

## 2015-07-26 NOTE — Consult Note (Signed)
Name: [name] MRN: [id number] DOB: 1950-06-04    ADMISSION DATE:  07/21/2015 CONSULTATION DATE: 07/26/15  REFERRING MD :  Curly Rim  CHIEF COMPLAINT:  Shortness of breath  BRIEF PATIENT DESCRIPTION: [name] is a 65 year old female with PMH significant for Asthma,DM, HTN, Dyslipidemia, Bronchitis and GERD  Presented with Asthama exacerbation .  She is very slowly improving and experienced some shortness of breath along with chest pain on 3/25. Troponins were elevated.  PCCM team was consulted on 3/26 for increased SOB.  SIGNIFICANT EVENTS  NONE  STUDIES:   3/26 CT chest >> Negative for PE  3/26 duplex of LE>> Negative.   HISTORY OF PRESENT ILLNESS: [name] is a 65 y.o. female who presents to the ED with c/o SOB. She has an extensive history of asthma in the past as well. This morning she developed onset of SOB with wheezing. Feels typical for asthma exacerbation. Used inhaler but symptoms continued to progress throughout the day. She presents to the ED. No fevers, chills, N/V/D, leg swelling. Asthma often triggered by seasonal changes  PAST MEDICAL HISTORY :   has a past medical history of Asthma; Diabetes mellitus; Hypertension; Dyslipidemia; Pneumonia; Bronchitis; and GERD (gastroesophageal reflux disease).  has past surgical history that includes Abdominal hysterectomy; Hernia repair; Knee arthroscopy; Hemorrhoid surgery; Esophagogastroduodenoscopy (egd) with propofol (N/A, 04/09/2014); and Colonoscopy with propofol (N/A, 04/09/2014). Prior to Admission medications   Medication Sig Start Date End Date Taking? Authorizing Provider  albuterol (PROVENTIL HFA;VENTOLIN HFA) 108 (90 BASE) MCG/ACT inhaler Inhale 2 puffs into the lungs every 6 (six) hours as needed for wheezing or shortness of breath. 07/30/14  Yes Gregor Hams, MD  albuterol (PROVENTIL) (2.5 MG/3ML) 0.083% nebulizer solution Take 3 mLs (2.5 mg total) by nebulization every 4 (four) hours as needed for  shortness of breath. 08/08/14  Yes [name], MD  ALPRAZolam Duanne Moron) 0.5 MG tablet Take 0.5 mg by mouth 2 (two) times daily as needed for anxiety.    Yes Historical Provider, MD  aspirin EC 81 MG tablet Take 81 mg by mouth every morning.    Yes Historical Provider, MD  atorvastatin (LIPITOR) 40 MG tablet Take 40 mg by mouth every morning.    Yes Historical Provider, MD  esomeprazole (NEXIUM) 40 MG capsule Take 40 mg by mouth daily before breakfast.   Yes Historical Provider, MD  estradiol (ESTRACE) 1 MG tablet Take 1 mg by mouth every morning.    Yes Historical Provider, MD  potassium chloride SA (K-DUR,KLOR-CON) 20 MEQ tablet Take 20 mEq by mouth every morning.    Yes Historical Provider, MD  valsartan-hydrochlorothiazide (DIOVAN-HCT) 320-25 MG per tablet Take 1 tablet by mouth every morning.    Yes Historical Provider, MD   Allergies  Allergen Reactions  . Amlodipine Besylate Other (See Comments)    Reaction unknown  . Lisinopril Other (See Comments)    Reaction unknown  . Pantoprazole Sodium Other (See Comments)    Reaction unknown    FAMILY HISTORY:  family history includes Allergies in her mother; Asthma in her mother. SOCIAL HISTORY:  reports that she quit smoking about 11 years ago. Her smoking use included Cigarettes. She has a 30 pack-year smoking history. She has never used smokeless tobacco. She reports that she does not drink alcohol or use illicit drugs.  REVIEW OF SYSTEMS:   Constitutional: Negative for fever, chills, weight loss, malaise/fatigue and diaphoresis.  HENT: Negative for hearing loss, ear pain, nosebleeds, congestion,  sore throat, neck pain, tinnitus and ear discharge.   Eyes: Negative for blurred vision, double vision, photophobia, pain, discharge and redness.  Respiratory: Negative for cough, hemoptysis, sputum production, shortness of breath, wheezing and stridor.   Cardiovascular: Negative for chest pain, palpitations, orthopnea, claudication, leg  swelling and PND.  Gastrointestinal: Negative for heartburn, nausea, vomiting, abdominal pain, diarrhea, constipation, blood in stool and melena.  Genitourinary: Negative for dysuria, urgency, frequency, hematuria and flank pain.  Musculoskeletal: Negative for myalgias, back pain, joint pain and falls.  Skin: Negative for itching and rash.  Neurological: Negative for dizziness, tingling, tremors, sensory change, speech change, focal weakness, seizures, loss of consciousness, weakness and headaches.  Endo/Heme/Allergies: Negative for environmental allergies and polydipsia. Does not bruise/bleed easily.  SUBJECTIVE:  Patient was found sitting on chair on RA, Is able to talk in full sentences.  Denies any chest pain at this time. Patient says that she has not improved since admission  VITAL SIGNS: Temp:  [98.1 F (36.7 C)-98.6 F (37 C)] 98.6 F (37 C) (03/27 0515) Pulse Rate:  [78-102] 98 (03/27 0558) Resp:  [18-20] 18 (03/27 0515) BP: (162-187)/(72-80) 176/73 mmHg (03/27 0558) SpO2:  [96 %-100 %] 96 % (03/27 0515)  PHYSICAL EXAMINATION: General:  Very pleasant , AA female,  Neuro:  Awake, alert, able to make appropriate conversation HEENT:  Atraumatic, normocephalic, no discharge, moist mucous memberane Cardiovascular:  S1,S2, rrr, no MRG Lungs:  Expiratory wheezes,no crackles,rales noted Abdomen: soft , non tender, BS positive Musculoskeletal:  No deformity noted Skin:  Intact   Recent Labs Lab 07/24/15 0439 07/25/15 0558 07/26/15 0427  NA 139 136 137  K 3.2* 3.5 3.4*  CL 103 98* 95*  CO2 25 28 30   BUN 19 18 24*  CREATININE 0.97 0.92 1.09*  GLUCOSE 238* 249* 245*    Recent Labs Lab 07/24/15 0439 07/25/15 0558 07/26/15 0427  HGB 11.7* 12.0 11.7*  HCT 36.2 35.1* 36.0  WBC 16.0* 14.1* 14.3*  PLT 323 281 319   Ct Angio Chest Pe W/cm &/or Wo Cm  07/25/2015  CLINICAL DATA:  Short of breath and cough for 5 days. Pain with coughing. EXAM: CT ANGIOGRAPHY CHEST WITH  CONTRAST TECHNIQUE: Multidetector CT imaging of the chest was performed using the standard protocol during bolus administration of intravenous contrast. Multiplanar CT image reconstructions and MIPs were obtained to evaluate the vascular anatomy. CONTRAST:  21mL OMNIPAQUE IOHEXOL 350 MG/ML SOLN COMPARISON:  Chest radiograph, 07/24/2015. FINDINGS: Angiographic study: There is no evidence of a pulmonary embolus. The great vessels are normal in caliber. No aortic dissection. Minimal atherosclerotic plaque along the aorta. No significant stenosis. Neck base and axilla:  No mass or adenopathy. Mediastinum and hila: Heart mildly enlarged. Moderate coronary artery calcifications. No mediastinal or hilar masses or evidence of adenopathy. Lungs and pleura: 4 mm nodule, peripheral right upper lobe, image 54, series 5. No other nodules. No lung consolidation or edema. No pleural effusion or pneumothorax. Limited upper abdomen:  Unremarkable. Musculoskeletal: Mild endplate spurring along the thoracic spine. No osteoblastic or osteolytic lesions. Review of the MIP images confirms the above findings. IMPRESSION: 1. No evidence of a pulmonary embolism. 2. No acute findings. 3. 4 mm right upper lobe nodule. Electronically Signed   By: Lajean Manes M.D.   On: 07/25/2015 14:38   Dg Chest Port 1 View  07/24/2015  CLINICAL DATA:  65 year old female with dyspnea EXAM: PORTABLE CHEST 1 VIEW COMPARISON:  Radiograph dated 07/21/2015 FINDINGS: The heart size and mediastinal  contours are within normal limits. Both lungs are clear. The visualized skeletal structures are unremarkable. IMPRESSION: No active disease. Electronically Signed   By: Anner Crete M.D.   On: 07/24/2015 23:36    ASSESSMENT / PLAN:  A Acute Asthma Exacerbation  P Continue Albuterol/Duoneb Pulmicort increased to 0.5 Continue Levaquin per primary Continue Solumedrol Flutter valve Continue Antitussives per primary.       [name],AG-ACNP [facility] [place] Pager: [phone]  07/26/2015, 11:27 AM

## 2015-07-26 NOTE — Progress Notes (Signed)
Pre Peak Flow 170, Post Peak Flow 200.

## 2015-07-26 NOTE — Progress Notes (Signed)
Pre Peak Flow 160, Post Peak Flow 180

## 2015-07-26 NOTE — Care Management Important Message (Signed)
Important Message  Patient Details  Name: [name] MRN: [id number] Date of Birth: 10-Apr-1951   Medicare Important Message Given:  Yes    Silva Aamodt, Leroy Sea 07/26/2015, 1:05 PM

## 2015-07-26 NOTE — Progress Notes (Signed)
  Echocardiogram 2D Echocardiogram has been performed.  Diamond Nickel 07/26/2015, 3:29 PM

## 2015-07-26 NOTE — Progress Notes (Signed)
Inpatient Diabetes Program Recommendations  AACE/ADA: New Consensus Statement on Inpatient Glycemic Control (2015)  Target Ranges:  Prepandial:   less than 140 mg/dL      Peak postprandial:   less than 180 mg/dL (1-2 hours)      Critically ill patients:  140 - 180 mg/dL   Results for MACENZIE, BERTINI (MRN [id number]) as of 07/26/2015 08:49  Ref. Range 07/25/2015 08:07 07/25/2015 11:46 07/25/2015 16:35 07/25/2015 22:31 07/26/2015 08:04  Glucose-Capillary Latest Ref Range: 65-99 mg/dL 204 (H) 207 (H) 303 (H) 283 (H) 235 (H)   Review of Glycemic Control  Diabetes history: DM 2 Current orders for Inpatient glycemic control: Novolog Sensitive + HS  Inpatient Diabetes Program Recommendations: Correction (SSI): IV Solumedrol started 60 mg Q6hrs. Please consider increasing correction to Novolog Moderate scale.  Thanks,  Tama Headings RN, MSN, [facility] Inpatient Diabetes Coordinator Team Pager [phone] (8a-5p)

## 2015-07-26 NOTE — Progress Notes (Signed)
[facility] PROGRESS NOTE  [name] [id number] DOB: 04-14-1951 DOA: 07/21/2015  PCP: [name], MD  Brief HPI: [name] with a past medical history of asthma, presented with complaints of shortness of breath. Patient was thought to have asthma exacerbation and she was hospitalized for further management. Patient has been very slow to improve. On the night of 3/25 she developed worsening shortness of breath along with chest pain.  Past medical history:  Past Medical History  Diagnosis Date  . Asthma   . Diabetes mellitus   . Hypertension   . Dyslipidemia   . Pneumonia   . Bronchitis   . GERD (gastroesophageal reflux disease)     Consultants: Pulmonology  Procedures: None  Antibiotics: Levaquin  Subjective: Patient continues to remain short of breath. She has not seen any significant improvement in the last 48 hours. However, she hasn't had any episodes like she did on Saturday night. Chest pain only with cough.   Objective: Vital Signs  Filed Vitals:   07/26/15 0022 07/26/15 0312 07/26/15 0515 07/26/15 0558  BP:   177/79 176/73  Pulse: 84 88 94 98  Temp:   98.6 F (37 C)   TempSrc:   Oral   Resp: 18 18 18    Height:      Weight:      SpO2: 97% 98% 96%     Intake/Output Summary (Last 24 hours) at 07/26/15 1010 Last data filed at 07/26/15 0540  Gross per 24 hour  Intake    480 ml  Output      0 ml  Net    480 ml   Filed Weights   07/21/15 2303  Weight: 74.299 kg (163 lb 12.8 oz)    General appearance: alert, cooperative, appears stated age and no distress Resp: Continues to have end expiratory wheezing bilaterally. No definite crackles or rhonchi. Tachypnea has improved.  Cardio: S1, S2 is slightly tachycardic, regular. No S3, S4. No rubs, murmurs, or bruit. No pedal edema. GI: soft, non-tender; bowel sounds normal; no masses,  no organomegaly Extremities: extremities normal, atraumatic, no cyanosis or  edema  Lab Results:  Basic Metabolic Panel:  Recent Labs Lab 07/22/15 0742 07/23/15 0736 07/24/15 0439 07/25/15 0558 07/26/15 0427  NA 138 138 139 136 137  K 3.6 3.0* 3.2* 3.5 3.4*  CL 104 104 103 98* 95*  CO2 23 23 25 28 30   GLUCOSE 200* 225* 238* 249* 245*  BUN 11 17 19 18  24*  CREATININE 0.91 0.96 0.97 0.92 1.09*  CALCIUM 8.7* 8.2* 8.4* 8.4* 8.7*   CBC:  Recent Labs Lab 07/21/15 1730 07/23/15 0736 07/24/15 0439 07/25/15 0558 07/26/15 0427  WBC 10.0 14.5* 16.0* 14.1* 14.3*  HGB 13.0 11.3* 11.7* 12.0 11.7*  HCT 39.7 33.7* 36.2 35.1* 36.0  MCV 92.5 93.6 93.8 92.9 92.3  PLT 324 301 323 281 319   Cardiac Enzymes:  Recent Labs Lab 07/21/15 1710 07/24/15 2344 07/25/15 0558 07/25/15 1114 07/26/15 0427  TROPONINI <0.03 <0.03 0.11* 0.13* 0.07*   BNP (last 3 results)  Recent Labs  07/21/15 1824 07/25/15 1533  BNP 11.8 49.6    CBG:  Recent Labs Lab 07/25/15 0807 07/25/15 1146 07/25/15 1635 07/25/15 2231 07/26/15 0804  GLUCAP 204* 207* 303* 283* 235*    No results found for this or any previous visit (from the past 240 hour(s)).    Studies/Results: Ct Angio Chest Pe W/cm &/or Wo Cm  07/25/2015  CLINICAL DATA:  Short of breath  and cough for 5 days. Pain with coughing. EXAM: CT ANGIOGRAPHY CHEST WITH CONTRAST TECHNIQUE: Multidetector CT imaging of the chest was performed using the standard protocol during bolus administration of intravenous contrast. Multiplanar CT image reconstructions and MIPs were obtained to evaluate the vascular anatomy. CONTRAST:  89mL OMNIPAQUE IOHEXOL 350 MG/ML SOLN COMPARISON:  Chest radiograph, 07/24/2015. FINDINGS: Angiographic study: There is no evidence of a pulmonary embolus. The great vessels are normal in caliber. No aortic dissection. Minimal atherosclerotic plaque along the aorta. No significant stenosis. Neck base and axilla:  No mass or adenopathy. Mediastinum and hila: Heart mildly enlarged. Moderate coronary artery  calcifications. No mediastinal or hilar masses or evidence of adenopathy. Lungs and pleura: 4 mm nodule, peripheral right upper lobe, image 54, series 5. No other nodules. No lung consolidation or edema. No pleural effusion or pneumothorax. Limited upper abdomen:  Unremarkable. Musculoskeletal: Mild endplate spurring along the thoracic spine. No osteoblastic or osteolytic lesions. Review of the MIP images confirms the above findings. IMPRESSION: 1. No evidence of a pulmonary embolism. 2. No acute findings. 3. 4 mm right upper lobe nodule. Electronically Signed   By: Lajean Manes M.D.   On: 07/25/2015 14:38   Dg Chest Port 1 View  07/24/2015  CLINICAL DATA:  65 year old female with dyspnea EXAM: PORTABLE CHEST 1 VIEW COMPARISON:  Radiograph dated 07/21/2015 FINDINGS: The heart size and mediastinal contours are within normal limits. Both lungs are clear. The visualized skeletal structures are unremarkable. IMPRESSION: No active disease. Electronically Signed   By: Anner Crete M.D.   On: 07/24/2015 23:36    Medications:  Scheduled: . aspirin EC  81 mg Oral q morning - 10a  . atorvastatin  40 mg Oral q morning - 10a  . benzonatate  200 mg Oral TID  . budesonide (PULMICORT) nebulizer solution  0.25 mg Nebulization BID  . chlorpheniramine-HYDROcodone  5 mL Oral Q12H  . enoxaparin (LOVENOX) injection  40 mg Subcutaneous QHS  . esomeprazole  40 mg Oral QAC breakfast  . estradiol  1 mg Oral q morning - 10a  . famotidine  20 mg Oral BID  . irbesartan  300 mg Oral Daily   And  . hydrochlorothiazide  25 mg Oral Daily  . insulin aspart  0-5 Units Subcutaneous QHS  . insulin aspart  0-9 Units Subcutaneous TID WC  . insulin glargine  10 Units Subcutaneous Daily  . insulin glargine  8 Units Subcutaneous QHS  . ipratropium-albuterol  3 mL Nebulization Q4H  . levofloxacin  750 mg Oral Daily  . loratadine  10 mg Oral Daily  . methylPREDNISolone (SOLU-MEDROL) injection  60 mg Intravenous Q6H  .  potassium chloride SA  20 mEq Oral q morning - 10a   Continuous:  [id number], ALPRAZolam, guaiFENesin-codeine, hydrALAZINE, HYDROcodone-acetaminophen, HYDROmorphone (DILAUDID) injection, sodium chloride  Assessment/Plan:  Active Problems:   DM (diabetes mellitus) type 2, uncontrolled, with ketoacidosis (HCC)   HYPERCHOLESTEROLEMIA   Essential hypertension   G E R D   Asthma exacerbation    Acute Asthma exacerbation.  Patient hasn't improved much despite maximal treatment. She is followed by pulmonology as outpatient. We will consult them today. Otherwise, continue treatment with antibiotics, steroids, nebulizer treatments antitussive agents. Check peak flows. Influenza panel is negative.  Chest Pain/Elevated trop Chest pain is most likely secondary to asthma exacerbation and pleurisy. Elevated troponin, most likely due to demand ischemia. Level this morning is improved. Echocardiogram is pending. CT angiogram was negative for PE. Lower extremity venous Dopplers negative  for DVT.   Elevated creatinine Secondary to Lasix. No further doses. Monitor for now.  4 Millimeter right upper lobe lung nodule Will need outpatient follow-up.  Left ear pain Left ear with pain and mild discharge noted by previous rounding physician. Patient was placed on antibiotics and Claritin. She feels better.   Diabetes mellitus type II uncontrolled Hemoglobin A1c 6.4. CBGs likely worsened due to steroids. Continue siding scale insulin. Increase Lantus dosage. Hopefully blood sugar should improve once steroid is tapered down.   Essential Hypertension Currently stable. Hold Avapro and HCTZ due to rise in creatinine.  Hyperlipidemia Continue statin  GERD Continue Pepcid  DVT Prophylaxis: Lovenox    Code Status: Full code  Family Communication: Discussed in detail with the patient  Disposition Plan: Continue treatment as outlined above. Await pulmonology input    LOS: 4 days    [facility] Pager [phone] 07/26/2015, 10:10 AM  If 7PM-7AM, please contact night-coverage at [email or web address], password [facility]

## 2015-07-27 DIAGNOSIS — R079 Chest pain, unspecified: Secondary | ICD-10-CM | POA: Insufficient documentation

## 2015-07-27 DIAGNOSIS — K219 Gastro-esophageal reflux disease without esophagitis: Secondary | ICD-10-CM

## 2015-07-27 LAB — BASIC METABOLIC PANEL
Anion gap: 8 (ref 5–15)
BUN: 24 mg/dL — AB (ref 6–20)
CHLORIDE: 96 mmol/L — AB (ref 101–111)
CO2: 32 mmol/L (ref 22–32)
CREATININE: 1.02 mg/dL — AB (ref 0.44–1.00)
Calcium: 8.9 mg/dL (ref 8.9–10.3)
GFR, EST NON AFRICAN AMERICAN: 56 mL/min — AB (ref >60)
Glucose, Bld: 217 mg/dL — ABNORMAL HIGH (ref 65–99)
POTASSIUM: 4 mmol/L (ref 3.5–5.1)
SODIUM: 136 mmol/L (ref 135–145)

## 2015-07-27 LAB — GLUCOSE, CAPILLARY
GLUCOSE-CAPILLARY: 265 mg/dL — AB (ref 65–99)
Glucose-Capillary: 195 mg/dL — ABNORMAL HIGH (ref 65–99)
Glucose-Capillary: 231 mg/dL — ABNORMAL HIGH (ref 65–99)
Glucose-Capillary: 298 mg/dL — ABNORMAL HIGH (ref 65–99)

## 2015-07-27 LAB — CBC
HCT: 36.6 % (ref 36.0–46.0)
HEMOGLOBIN: 11.8 g/dL — AB (ref 12.0–15.0)
MCH: 29.9 pg (ref 26.0–34.0)
MCHC: 32.2 g/dL (ref 30.0–36.0)
MCV: 92.7 fL (ref 78.0–100.0)
PLATELETS: 347 10*3/uL (ref 150–400)
RBC: 3.95 MIL/uL (ref 3.87–5.11)
RDW: 14 % (ref 11.5–15.5)
WBC: 15.1 10*3/uL — ABNORMAL HIGH (ref 4.0–10.5)

## 2015-07-27 MED ORDER — PREDNISONE 50 MG PO TABS
60.0000 mg | ORAL_TABLET | Freq: Every day | ORAL | Status: DC
  Administered 2015-07-28 – 2015-07-31 (×4): 60 mg via ORAL
  Filled 2015-07-27 (×4): qty 1

## 2015-07-27 MED ORDER — IRBESARTAN 300 MG PO TABS
300.0000 mg | ORAL_TABLET | Freq: Every day | ORAL | Status: DC
  Administered 2015-07-27 – 2015-07-31 (×5): 300 mg via ORAL
  Filled 2015-07-27 (×5): qty 1

## 2015-07-27 MED ORDER — IPRATROPIUM-ALBUTEROL 0.5-2.5 (3) MG/3ML IN SOLN
3.0000 mL | Freq: Three times a day (TID) | RESPIRATORY_TRACT | Status: DC
  Administered 2015-07-27 – 2015-07-28 (×2): 3 mL via RESPIRATORY_TRACT
  Filled 2015-07-27 (×3): qty 3

## 2015-07-27 NOTE — Progress Notes (Signed)
Name: [name] MRN: [id number] DOB: 07-23-1950    ADMISSION DATE:  07/21/2015 CONSULTATION DATE: 07/26/15  REFERRING MD :  Curly Rim  CHIEF COMPLAINT:  Shortness of breath  BRIEF PATIENT DESCRIPTION: [name] is a 65 year old female with PMH significant for Asthma,DM, HTN, Dyslipidemia, Bronchitis and GERD  Presented with Asthama exacerbation .  She is very slowly improving and experienced some shortness of breath along with chest pain on 3/25. Troponins were elevated.  PCCM team was consulted on 3/26 for increased SOB.  SIGNIFICANT EVENTS  NONE  STUDIES:   3/26 CT chest >> Negative for PE  3/26 duplex of LE>> Negative.  3/27 ECHO> Normal with EF OF 60 -65%  SUBJECTIVE:  Patient says that she feel 10% better than yesterday. Patient says she was able to sleep some yesterday.  VITAL SIGNS: Temp:  [98 F (36.7 C)-99.2 F (37.3 C)] 98 F (36.7 C) (03/28 0511) Pulse Rate:  [87-104] 98 (03/28 0607) Resp:  [18] 18 (03/28 0511) BP: (163-186)/(74-86) 164/74 mmHg (03/28 0607) SpO2:  [95 %-100 %] 95 % (03/28 0511)  PHYSICAL EXAMINATION: General:  Very pleasant , AA female,  Neuro:  Awake, alert, able to make appropriate conversation HEENT:  Atraumatic, normocephalic, no discharge, moist mucous memberane Cardiovascular:  S1,S2, rrr, no MRG Lungs:  Expiratory wheezes upper,no crackles,rales noted Abdomen: soft , non tender, BS positive Musculoskeletal:  No deformity noted Skin:  Intact   Recent Labs Lab 07/25/15 0558 07/26/15 0427 07/27/15 0524  NA 136 137 136  K 3.5 3.4* 4.0  CL 98* 95* 96*  CO2 28 30 32  BUN 18 24* 24*  CREATININE 0.92 1.09* 1.02*  GLUCOSE 249* 245* 217*    Recent Labs Lab 07/25/15 0558 07/26/15 0427 07/27/15 0524  HGB 12.0 11.7* 11.8*  HCT 35.1* 36.0 36.6  WBC 14.1* 14.3* 15.1*  PLT 281 319 347   Ct Angio Chest Pe W/cm &/or Wo Cm  07/25/2015  CLINICAL DATA:  Short of breath and cough for 5 days. Pain with coughing. EXAM: CT  ANGIOGRAPHY CHEST WITH CONTRAST TECHNIQUE: Multidetector CT imaging of the chest was performed using the standard protocol during bolus administration of intravenous contrast. Multiplanar CT image reconstructions and MIPs were obtained to evaluate the vascular anatomy. CONTRAST:  48mL OMNIPAQUE IOHEXOL 350 MG/ML SOLN COMPARISON:  Chest radiograph, 07/24/2015. FINDINGS: Angiographic study: There is no evidence of a pulmonary embolus. The great vessels are normal in caliber. No aortic dissection. Minimal atherosclerotic plaque along the aorta. No significant stenosis. Neck base and axilla:  No mass or adenopathy. Mediastinum and hila: Heart mildly enlarged. Moderate coronary artery calcifications. No mediastinal or hilar masses or evidence of adenopathy. Lungs and pleura: 4 mm nodule, peripheral right upper lobe, image 54, series 5. No other nodules. No lung consolidation or edema. No pleural effusion or pneumothorax. Limited upper abdomen:  Unremarkable. Musculoskeletal: Mild endplate spurring along the thoracic spine. No osteoblastic or osteolytic lesions. Review of the MIP images confirms the above findings. IMPRESSION: 1. No evidence of a pulmonary embolism. 2. No acute findings. 3. 4 mm right upper lobe nodule. Electronically Signed   By: Lajean Manes M.D.   On: 07/25/2015 14:38    ASSESSMENT / PLAN:  A Acute Asthma Exacerbation  P Continue Albuterol/Duoneb Pulmicort increased to 0.5 Continue Levaquin per primary Continue Solumedrol Flutter valve Continue Antitussives per primary.  A   Hx of GERD P Continue antacids  A Leukocytosis likely due to steroids P CBC in  am    [facility] [place] Pager: [phone]  07/27/2015, 9:28 AM  PCCM Attending Note: Patient seen and examined with nurse practitioner. Please refer to her progress note which I reviewed in detail. Denies any chest pain or  pressure. Dyspnea slowly improving. Cough stable.  BP 164/74 mmHg  Pulse 98  Temp(Src) 98 F (36.7 C) (Oral)  Resp 18  Ht 5' 2.5" (1.588 m)  Wt 74.299 kg (163 lb 12.8 oz)  BMI 29.46 kg/m2  SpO2 95% Gen.: Sitting up in bed. No distress. Integument: Warm and dry. No rash on exposed skin. Pulmonary: Good aeration bilaterally. Mild end expiratory wheeze. Normal work of breathing.  CBC Latest Ref Rng 07/27/2015 07/26/2015 07/25/2015  WBC 4.0 - 10.5 K/uL 15.1(H) 14.3(H) 14.1(H)  Hemoglobin 12.0 - 15.0 g/dL 11.8(L) 11.7(L) 12.0  Hematocrit 36.0 - 46.0 % 36.6 36.0 35.1(L)  Platelets 150 - 400 K/uL 347 319 281    BMP Latest Ref Rng 07/27/2015 07/26/2015 07/25/2015  Glucose 65 - 99 mg/dL 217(H) 245(H) 249(H)  BUN 6 - 20 mg/dL 24(H) 24(H) 18  Creatinine 0.44 - 1.00 mg/dL 1.02(H) 1.09(H) 0.92  Sodium 135 - 145 mmol/L 136 137 136  Potassium 3.5 - 5.1 mmol/L 4.0 3.4(L) 3.5  Chloride 101 - 111 mmol/L 96(L) 95(L) 98(L)  CO2 22 - 32 mmol/L 32 30 28  Calcium 8.9 - 10.3 mg/dL 8.9 8.7(L) 8.4(L)   A/P:  65 year old female with asthma exacerbation. Slow clinical improvement. Beginning taper of steroids.Continue to monitor closely.  1. Asthma Exacerbation:  Budesonide nebs bid along with Duoneb tid. Empiric Levaquin Day #5. Recommend a 7 day course. D/C Solu-medrol IV q6hr. Prednisone 60mg  daily starting tomorrow. 2. RUL Nodule:  Patient will need f/u CT imaging as outpatient. 3. H/O GERD:  Nexium  & Pepcid PO.  Marcanthony Sleight E. Ashok Cordia, M.D. [facility] Pager:  [phone] After 3pm or if no response, call [phone] 12:15 PM 07/27/2015

## 2015-07-27 NOTE — Progress Notes (Signed)
[facility] PROGRESS NOTE  [name] [id number] DOB: 1950/06/01 DOA: 07/21/2015  PCP: [name], MD  Brief HPI: [name] with a past medical history of asthma, presented with complaints of shortness of breath. Patient was thought to have asthma exacerbation and she was hospitalized for further management. Patient has been very slow to improve. On the night of 3/25 she developed worsening shortness of breath along with chest pain.  Past medical history:  Past Medical History  Diagnosis Date  . Asthma   . Diabetes mellitus   . Hypertension   . Dyslipidemia   . Pneumonia   . Bronchitis   . GERD (gastroesophageal reflux disease)     Consultants: Pulmonology  Procedures:  Echocardiogram Study Conclusions - Left ventricle: The cavity size was normal. Wall thickness was increased in a pattern of mild LVH. Systolic function was normal. The estimated ejection fraction was in the range of 60% to 65%. Wall motion was normal; there were no regional wall motion abnormalities. Doppler parameters are consistent with abnormal left ventricular relaxation (grade 1 diastolic dysfunction). - Atrial septum: No defect or patent foramen ovale was identified.  Antibiotics: Levaquin  Subjective: Patient finally feels slightly better this morning. Denies any chest pain. Cough is improved.   Objective: Vital Signs  Filed Vitals:   07/26/15 2140 07/26/15 2317 07/27/15 0511 07/27/15 0607  BP: 177/75 163/78 186/86 164/74  Pulse: 87 101 91 98  Temp: 98.7 F (37.1 C)  98 F (36.7 C)   TempSrc: Oral  Oral   Resp: 18  18   Height:      Weight:      SpO2: 100%  95%     Intake/Output Summary (Last 24 hours) at 07/27/15 1042 Last data filed at 07/27/15 0949  Gross per 24 hour  Intake   1200 ml  Output      0 ml  Net   1200 ml   Filed Weights   07/21/15 2303  Weight: 74.299 kg (163 lb 12.8 oz)    General appearance: alert, cooperative,  appears stated age and no distress Resp: Improved air entry bilaterally. Less wheezing compared to yesterday. No crackles. Less tachypneic.  Cardio: S1, S2 is normal, regular. No S3, S4. No rubs, murmurs, or bruit. No pedal edema. GI: soft, non-tender; bowel sounds normal; no masses,  no organomegaly Extremities: extremities normal, atraumatic, no cyanosis or edema  Lab Results:  Basic Metabolic Panel:  Recent Labs Lab 07/23/15 0736 07/24/15 0439 07/25/15 0558 07/26/15 0427 07/27/15 0524  NA 138 139 136 137 136  K 3.0* 3.2* 3.5 3.4* 4.0  CL 104 103 98* 95* 96*  CO2 23 25 28 30  32  GLUCOSE 225* 238* 249* 245* 217*  BUN 17 19 18  24* 24*  CREATININE 0.96 0.97 0.92 1.09* 1.02*  CALCIUM 8.2* 8.4* 8.4* 8.7* 8.9   CBC:  Recent Labs Lab 07/23/15 0736 07/24/15 0439 07/25/15 0558 07/26/15 0427 07/27/15 0524  WBC 14.5* 16.0* 14.1* 14.3* 15.1*  HGB 11.3* 11.7* 12.0 11.7* 11.8*  HCT 33.7* 36.2 35.1* 36.0 36.6  MCV 93.6 93.8 92.9 92.3 92.7  PLT 301 323 281 319 347   Cardiac Enzymes:  Recent Labs Lab 07/21/15 1710 07/24/15 2344 07/25/15 0558 07/25/15 1114 07/26/15 0427  TROPONINI <0.03 <0.03 0.11* 0.13* 0.07*   BNP (last 3 results)  Recent Labs  07/21/15 1824 07/25/15 1533  BNP 11.8 49.6    CBG:  Recent Labs Lab 07/26/15 0804 07/26/15 1210 07/26/15 1711 07/26/15  2135 07/27/15 0806  GLUCAP 235* 260* 208* 216* 195*    No results found for this or any previous visit (from the past 240 hour(s)).    Studies/Results: Ct Angio Chest Pe W/cm &/or Wo Cm  07/25/2015  CLINICAL DATA:  Short of breath and cough for 5 days. Pain with coughing. EXAM: CT ANGIOGRAPHY CHEST WITH CONTRAST TECHNIQUE: Multidetector CT imaging of the chest was performed using the standard protocol during bolus administration of intravenous contrast. Multiplanar CT image reconstructions and MIPs were obtained to evaluate the vascular anatomy. CONTRAST:  49mL OMNIPAQUE IOHEXOL 350 MG/ML SOLN  COMPARISON:  Chest radiograph, 07/24/2015. FINDINGS: Angiographic study: There is no evidence of a pulmonary embolus. The great vessels are normal in caliber. No aortic dissection. Minimal atherosclerotic plaque along the aorta. No significant stenosis. Neck base and axilla:  No mass or adenopathy. Mediastinum and hila: Heart mildly enlarged. Moderate coronary artery calcifications. No mediastinal or hilar masses or evidence of adenopathy. Lungs and pleura: 4 mm nodule, peripheral right upper lobe, image 54, series 5. No other nodules. No lung consolidation or edema. No pleural effusion or pneumothorax. Limited upper abdomen:  Unremarkable. Musculoskeletal: Mild endplate spurring along the thoracic spine. No osteoblastic or osteolytic lesions. Review of the MIP images confirms the above findings. IMPRESSION: 1. No evidence of a pulmonary embolism. 2. No acute findings. 3. 4 mm right upper lobe nodule. Electronically Signed   By: Lajean Manes M.D.   On: 07/25/2015 14:38    Medications:  Scheduled: . aspirin EC  81 mg Oral q morning - 10a  . atorvastatin  40 mg Oral q morning - 10a  . benzonatate  200 mg Oral TID  . budesonide (PULMICORT) nebulizer solution  0.5 mg Nebulization BID  . chlorpheniramine-HYDROcodone  5 mL Oral Q12H  . enoxaparin (LOVENOX) injection  40 mg Subcutaneous QHS  . esomeprazole  40 mg Oral QAC breakfast  . estradiol  1 mg Oral q morning - 10a  . famotidine  20 mg Oral BID  . insulin aspart  0-5 Units Subcutaneous QHS  . insulin aspart  0-9 Units Subcutaneous TID WC  . insulin glargine  10 Units Subcutaneous Daily  . insulin glargine  8 Units Subcutaneous QHS  . ipratropium-albuterol  3 mL Nebulization QID  . levofloxacin  750 mg Oral Daily  . loratadine  10 mg Oral Daily  . methylPREDNISolone (SOLU-MEDROL) injection  60 mg Intravenous Q6H  . potassium chloride SA  20 mEq Oral q morning - 10a   Continuous:  [id number], ALPRAZolam, guaiFENesin-codeine, hydrALAZINE,  HYDROcodone-acetaminophen, HYDROmorphone (DILAUDID) injection, sodium chloride  Assessment/Plan:  Active Problems:   DM (diabetes mellitus) type 2, uncontrolled, with ketoacidosis (HCC)   HYPERCHOLESTEROLEMIA   Essential hypertension   G E R D   Asthma exacerbation    Acute Asthma exacerbation.  There appears to be some component of upper airway dysfunction/vocal cord dysfunction. Patient, however, seems to be improved this morning. Continue current treatment for now. Otherwise, defer to pulmonology. Peak flows are being checked. Influenza is negative. Elevated WBC is due to steroids.   Chest Pain/Elevated trop Chest pain is most likely secondary to asthma exacerbation and pleurisy. Elevated troponin, most likely due to demand ischemia. Echocardiogram report is as above. No wall motion abnormality is noted. CT angiogram was negative for PE. Lower extremity venous Dopplers negative for DVT.   Elevated creatinine Secondary to Lasix. No further doses. Improved.  4 Millimeter right upper lobe lung nodule Will need outpatient follow-up.  Left ear pain Left ear with pain and mild discharge noted by previous rounding physician. Patient was placed on antibiotics and Claritin. She feels better.   Diabetes mellitus type II uncontrolled Hemoglobin A1c 6.4. CBGs likely worsened due to steroids. Continue siding scale insulin. Continue current Lantus dosage. Please reduce dose of Lantus as steroid is tapered down. Patient is not on Lantus at home and only on oral agent.  Essential Hypertension Blood pressure is noted to be elevated. Okay to resume ARB since creatinine is improved.  Hyperlipidemia Continue statin  GERD Continue Pepcid  DVT Prophylaxis: Lovenox    Code Status: Full code  Family Communication: Discussed in detail with the patient  Disposition Plan: Continue treatment as outlined above. Appreciate pulmonology input. Patient seems to be finally improving. Hopefully discharge  in 1-2 days.    LOS: 5 days   [facility] Pager [phone] 07/27/2015, 10:42 AM  If 7PM-7AM, please contact night-coverage at [email or web address], password [facility]

## 2015-07-28 DIAGNOSIS — E1310 Other specified diabetes mellitus with ketoacidosis without coma: Secondary | ICD-10-CM

## 2015-07-28 DIAGNOSIS — E7800 Pure hypercholesterolemia, unspecified: Secondary | ICD-10-CM

## 2015-07-28 DIAGNOSIS — I10 Essential (primary) hypertension: Secondary | ICD-10-CM

## 2015-07-28 DIAGNOSIS — R911 Solitary pulmonary nodule: Secondary | ICD-10-CM

## 2015-07-28 LAB — CBC
HEMATOCRIT: 34.8 % — AB (ref 36.0–46.0)
HEMOGLOBIN: 11.6 g/dL — AB (ref 12.0–15.0)
MCH: 31.1 pg (ref 26.0–34.0)
MCHC: 33.3 g/dL (ref 30.0–36.0)
MCV: 93.3 fL (ref 78.0–100.0)
Platelets: 277 10*3/uL (ref 150–400)
RBC: 3.73 MIL/uL — ABNORMAL LOW (ref 3.87–5.11)
RDW: 14 % (ref 11.5–15.5)
WBC: 15 10*3/uL — ABNORMAL HIGH (ref 4.0–10.5)

## 2015-07-28 LAB — BASIC METABOLIC PANEL
Anion gap: 7 (ref 5–15)
BUN: 25 mg/dL — AB (ref 6–20)
CALCIUM: 8.7 mg/dL — AB (ref 8.9–10.3)
CHLORIDE: 100 mmol/L — AB (ref 101–111)
CO2: 31 mmol/L (ref 22–32)
CREATININE: 1.06 mg/dL — AB (ref 0.44–1.00)
GFR calc non Af Amer: 53 mL/min — ABNORMAL LOW (ref >60)
Glucose, Bld: 117 mg/dL — ABNORMAL HIGH (ref 65–99)
Potassium: 4 mmol/L (ref 3.5–5.1)
SODIUM: 138 mmol/L (ref 135–145)

## 2015-07-28 LAB — GLUCOSE, CAPILLARY
GLUCOSE-CAPILLARY: 177 mg/dL — AB (ref 65–99)
Glucose-Capillary: 84 mg/dL (ref 65–99)
Glucose-Capillary: 248 mg/dL — ABNORMAL HIGH (ref 65–99)
Glucose-Capillary: 200 mg/dL — ABNORMAL HIGH (ref 65–99)

## 2015-07-28 MED ORDER — IPRATROPIUM-ALBUTEROL 0.5-2.5 (3) MG/3ML IN SOLN
3.0000 mL | Freq: Two times a day (BID) | RESPIRATORY_TRACT | Status: DC
Start: 2015-07-28 — End: 2015-07-31
  Administered 2015-07-28 – 2015-07-31 (×5): 3 mL via RESPIRATORY_TRACT
  Filled 2015-07-28 (×6): qty 3

## 2015-07-28 MED ORDER — GUAIFENESIN ER 600 MG PO TB12
600.0000 mg | ORAL_TABLET | Freq: Two times a day (BID) | ORAL | Status: DC
  Administered 2015-07-28 – 2015-07-31 (×6): 600 mg via ORAL
  Filled 2015-07-28 (×6): qty 1

## 2015-07-28 MED ORDER — BISACODYL 5 MG PO TBEC
10.0000 mg | DELAYED_RELEASE_TABLET | Freq: Once | ORAL | Status: AC
  Administered 2015-07-28: 10 mg via ORAL
  Filled 2015-07-28: qty 2

## 2015-07-28 MED ORDER — DOCUSATE SODIUM 100 MG PO CAPS
100.0000 mg | ORAL_CAPSULE | Freq: Two times a day (BID) | ORAL | Status: DC
  Administered 2015-07-28 – 2015-07-29 (×2): 100 mg via ORAL
  Filled 2015-07-28 (×2): qty 1

## 2015-07-28 NOTE — Progress Notes (Signed)
Patient ID: [name], female   DOB: [birth year], 65 y.o.   MRN: [id number]  [facility] PROGRESS NOTE  [name] [id number] DOB: 24-Oct-1950 DOA: 07/21/2015 PCP: [name], MD   Brief narrative:    [name] with a past medical history of asthma, presented with complaints of shortness of breath. Patient was thought to have asthma exacerbation and she was hospitalized for further management. Patient has been very slow to improve. On the night of 3/25 she developed worsening shortness of breath along with chest pain. PCCM consulted for assistance.   Assessment/Plan:    Acute Asthma exacerbation / Right upper lung nodule - continues to improve but slow - continue Levaquin day #6/7 - continue Prednisone with slow taper over the next two weeks  - Continue Albuterol/Duoneb, Pulmicort increased to 0.5 - will need follow up CT chest for further evaluation of nodule  - appreciate PCCM team assistance   Chest Pain/Elevated trop - likely secondary to asthma exacerbation and pleurisy - no chest pain this AM  Acute kidney injury - due to Lasix, improving   4 Millimeter right upper lobe lung nodule - will need outpatient follow up   Left ear pain - denies pain this AM  Diabetes mellitus type II uncontrolled - can resume oral regimen upon discharge   Essential Hypertension - Blood pressure is noted to be elevated - resumed ARB  Hyperlipidemia - Continue statin  GERD - Continue Pepcid  DVT prophylaxis - Lovenox SQ  Code Status: Full.  Family Communication:  plan of care discussed with the patient Disposition Plan: Home when cleared by PCCM  IV access:  Peripheral IV  Procedures and diagnostic studies:    Ct Angio Chest Pe W/cm &/or Wo Cm 07/25/2015  No evidence of a pulmonary embolism. 2. No acute findings. 3. 4 mm right upper lobe nodule.   Dg Chest Port 1 View 07/24/2015  No active disease.  Dg Chest Port 1 View 07/21/2015   Decreased bronchitic changes.  No airspace consolidation.  Medical Consultants:  None  Other Consultants:  None  IAnti-Infectives:   Levaquin day #6/7  Faye Ramsay, MD  [facility] Pager [phone]  If 7PM-7AM, please contact night-coverage [email or web address] Password [facility] 07/28/2015, 6:25 PM   LOS: 6 days   HPI/Subjective: No events overnight.   Objective: Filed Vitals:   07/27/15 2133 07/28/15 0600 07/28/15 1058 07/28/15 1313  BP: 155/64 184/86  175/86  Pulse: 95 79  89  Temp: 98.7 F (37.1 C) 98.6 F (37 C)  98.6 F (37 C)  TempSrc: Oral Oral  Oral  Resp: 20 20  19   Height:      Weight:      SpO2: 98% 96% 97% 95%    Intake/Output Summary (Last 24 hours) at 07/28/15 1825 Last data filed at 07/28/15 1804  Gross per 24 hour  Intake    800 ml  Output      0 ml  Net    800 ml    Exam:   General:  Pt is alert, follows commands appropriately, not in acute distress  Cardiovascular: Regular rate and rhythm, no rubs, no gallops  Respiratory: upper resp wheezing, diminished breath sounds at bases   Abdomen: Soft, non tender, non distended, bowel sounds present, no guarding  Extremities: No edema, pulses DP and PT palpable bilaterally  Neuro: Grossly nonfocal  Data Reviewed: Basic Metabolic Panel:  Recent Labs Lab 07/24/15 0439 07/25/15 0558 07/26/15 0427 07/27/15 0524 07/28/15 0509  NA  139 136 137 136 138  K 3.2* 3.5 3.4* 4.0 4.0  CL 103 98* 95* 96* 100*  CO2 25 28 30  32 31  GLUCOSE 238* 249* 245* 217* 117*  BUN 19 18 24* 24* 25*  CREATININE 0.97 0.92 1.09* 1.02* 1.06*  CALCIUM 8.4* 8.4* 8.7* 8.9 8.7*   CBC:  Recent Labs Lab 07/24/15 0439 07/25/15 0558 07/26/15 0427 07/27/15 0524 07/28/15 0509  WBC 16.0* 14.1* 14.3* 15.1* 15.0*  HGB 11.7* 12.0 11.7* 11.8* 11.6*  HCT 36.2 35.1* 36.0 36.6 34.8*  MCV 93.8 92.9 92.3 92.7 93.3  PLT 323 281 319 347 277   Cardiac Enzymes:  Recent Labs Lab 07/24/15 2344 07/25/15 0558 07/25/15 1114  07/26/15 0427  TROPONINI <0.03 0.11* 0.13* 0.07*   CBG:  Recent Labs Lab 07/27/15 1713 07/27/15 2036 07/28/15 0840 07/28/15 1205 07/28/15 1636  GLUCAP 265* 298* 84 177* 248*   Scheduled Meds: . aspirin EC  81 mg Oral q morning - 10a  . atorvastatin  40 mg Oral q morning - 10a  . benzonatate  200 mg Oral TID  . budesonide nebulizer   0.5 mg Nebulization BID  . enoxaparin  injection  40 mg Subcutaneous QHS  . esomeprazole  40 mg Oral QAC breakfast  . estradiol  1 mg Oral q morning - 10a  . famotidine  20 mg Oral BID  . insulin aspart  0-5 Units Subcutaneous QHS  . insulin aspart  0-9 Units Subcutaneous TID WC  . insulin glargine  10 Units Subcutaneous Daily  . insulin glargine  8 Units Subcutaneous QHS  . ipratropium-albuterol  3 mL Nebulization BID  . irbesartan  300 mg Oral Daily  . levofloxacin  750 mg Oral Daily  . loratadine  10 mg Oral Daily  . potassium chloride SA  20 mEq Oral q morning - 10a  . predniSONE  60 mg Oral Q breakfast   Continuous Infusions:

## 2015-07-28 NOTE — Progress Notes (Signed)
Pt states that she is constipated. Paged [place] regarding a stool softener.  No new orders at this time. Will continue to monitor

## 2015-07-28 NOTE — Progress Notes (Signed)
Inpatient Diabetes Program Recommendations  AACE/ADA: New Consensus Statement on Inpatient Glycemic Control (2015)  Target Ranges:  Prepandial:   less than 140 mg/dL      Peak postprandial:   less than 180 mg/dL (1-2 hours)      Critically ill patients:  140 - 180 mg/dL   Review of Glycemic Control  Inpatient Diabetes Program Recommendations: Consider adding Novolog 4 units TID with meals for elevated postprandials. Thank you  Raoul Pitch BSN, RN,CDE Inpatient Diabetes Coordinator [phone] (team pager)

## 2015-07-28 NOTE — Progress Notes (Signed)
Name: [name] MRN: [id number] DOB: 1951-03-31    ADMISSION DATE:  07/21/2015 CONSULTATION DATE: 07/26/15  REFERRING MD :  Curly Rim  CHIEF COMPLAINT:  Shortness of breath  BRIEF PATIENT DESCRIPTION: 65 year old female with PMH significant for Asthma,DM, HTN, Dyslipidemia, Bronchitis and GERD  Presented with Asthama exacerbation .  She is very slowly improving and experienced some shortness of breath along with chest pain on 3/25. Troponins were elevated.  PCCM team was consulted on 3/26 for increased SOB.  SIGNIFICANT EVENTS  3/22 - Admit  STUDIES:   3/26 CT chest >> Negative for PE  3/26 duplex of LE>> Negative.  3/27 ECHO> Normal with EF OF 60 -65%  SUBJECTIVE:  Patient says that she feel 10% better than yesterday. Patient says she was able to sleep some yesterday.  VITAL SIGNS: Temp:  [98.4 F (36.9 C)-98.7 F (37.1 C)] 98.6 F (37 C) (03/29 0600) Pulse Rate:  [79-100] 79 (03/29 0600) Resp:  [20] 20 (03/29 0600) BP: (153-184)/(64-86) 184/86 mmHg (03/29 0600) SpO2:  [96 %-98 %] 96 % (03/29 0600)  PHYSICAL EXAMINATION: General:  Very pleasant , AA female,  Neuro:  Awake, alert, able to make appropriate conversation HEENT:  Atraumatic, normocephalic, no discharge, moist mucous memberane Cardiovascular:  S1,S2, rrr, no MRG Lungs:  Expiratory wheezes upper,no crackles,rales noted Abdomen: soft , non tender, BS positive Musculoskeletal:  No deformity noted Skin:  Intact   Recent Labs Lab 07/26/15 0427 07/27/15 0524 07/28/15 0509  NA 137 136 138  K 3.4* 4.0 4.0  CL 95* 96* 100*  CO2 30 32 31  BUN 24* 24* 25*  CREATININE 1.09* 1.02* 1.06*  GLUCOSE 245* 217* 117*    Recent Labs Lab 07/26/15 0427 07/27/15 0524 07/28/15 0509  HGB 11.7* 11.8* 11.6*  HCT 36.0 36.6 34.8*  WBC 14.3* 15.1* 15.0*  PLT 319 347 277   No results found.  ASSESSMENT / PLAN:  A Acute Asthma Exacerbation Right upper lung nodule Upper airway congestion  P Continue  Albuterol/Duoneb Pulmicort increased to 0.5 Continue Levaquin will stop on 3/30 Continue prednisone Flutter valve Continue Antitussives per primary. Continue nasal spray/lortadine Follow up CT as otpatient     Bincy Varughese,AG-ACNP Pulmonary & Critical Care Pulmonary and [place] Pager: [phone]  PCCM Attending Note: Patient seen and examined with nurse practitioner. Please refer to her progress note which I reviewed in detail. Patient denies any subjective fever, chills, or sweats. Dyspnea slowly improving. Denies any cough or audible wheezing.  BP 184/86 mmHg  Pulse 79  Temp(Src) 98.6 F (37 C) (Oral)  Resp 20  Ht 5' 2.5" (1.588 m)  Wt 163 lb 12.8 oz (74.299 kg)  BMI 29.46 kg/m2  SpO2 97%  Gen.: Sitting up in Chair. No distress.Awake. Integument: Warm and dry. No rash on exposed skin. Cardiovascular: Regular rate. No edema. No appreciable JVD. Pulmonary: Good aeration bilaterally. Persistent mild end expiratory wheeze. Normal work of breathing.  CBC Latest Ref Rng 07/28/2015 07/27/2015 07/26/2015  WBC 4.0 - 10.5 K/uL 15.0(H) 15.1(H) 14.3(H)  Hemoglobin 12.0 - 15.0 g/dL 11.6(L) 11.8(L) 11.7(L)  Hematocrit 36.0 - 46.0 % 34.8(L) 36.6 36.0  Platelets 150 - 400 K/uL 277 347 319    BMP Latest Ref Rng 07/28/2015 07/27/2015 07/26/2015  Glucose 65 - 99 mg/dL 117(H) 217(H) 245(H)  BUN 6 - 20 mg/dL 25(H) 24(H) 24(H)  Creatinine 0.44 - 1.00 mg/dL 1.06(H) 1.02(H) 1.09(H)  Sodium 135 - 145 mmol/L 138 136 137  Potassium 3.5 - 5.1 mmol/L 4.0 4.0 3.4(L)  Chloride 101 - 111 mmol/L 100(L) 96(L) 95(L)  CO2 22 - 32 mmol/L 31 32 30  Calcium 8.9 - 10.3 mg/dL 8.7(L) 8.9 8.7(L)   A/P: 65 year old female with asthma exacerbation. Slow clinical improvement. Tolerating oral steroids. Patient encouraged to ambulate in the hallway today.  1. Asthma Exacerbation: Budesonide nebs bid along with Duoneb tid. Empiric Levaquin Day #6/7. Continuing Prednisone  60mg  daily.Plan for a slow two-week taper. 2. RUL Nodule: Patient will need f/u CT imaging as outpatient. 3. H/O GERD: Nexium & Pepcid PO.  Aidan Caloca E. Ashok Cordia, M.D. [facility] Pulmonary & Critical Care Pager:  [phone] After 3pm or if no response, call [phone] 11:34 AM 07/28/2015

## 2015-07-29 DIAGNOSIS — R0600 Dyspnea, unspecified: Secondary | ICD-10-CM

## 2015-07-29 DIAGNOSIS — K5900 Constipation, unspecified: Secondary | ICD-10-CM

## 2015-07-29 DIAGNOSIS — R0609 Other forms of dyspnea: Secondary | ICD-10-CM | POA: Insufficient documentation

## 2015-07-29 LAB — CBC
HCT: 35.1 % — ABNORMAL LOW (ref 36.0–46.0)
Hemoglobin: 12 g/dL (ref 12.0–15.0)
MCH: 31.7 pg (ref 26.0–34.0)
MCHC: 34.2 g/dL (ref 30.0–36.0)
MCV: 92.6 fL (ref 78.0–100.0)
PLATELETS: 281 10*3/uL (ref 150–400)
RBC: 3.79 MIL/uL — ABNORMAL LOW (ref 3.87–5.11)
RDW: 14.1 % (ref 11.5–15.5)
WBC: 14.4 10*3/uL — ABNORMAL HIGH (ref 4.0–10.5)

## 2015-07-29 LAB — BASIC METABOLIC PANEL
Anion gap: 10 (ref 5–15)
BUN: 24 mg/dL — AB (ref 6–20)
CALCIUM: 8.4 mg/dL — AB (ref 8.9–10.3)
CO2: 25 mmol/L (ref 22–32)
Chloride: 100 mmol/L — ABNORMAL LOW (ref 101–111)
Creatinine, Ser: 0.89 mg/dL (ref 0.44–1.00)
GFR calc Af Amer: >60 mL/min (ref >60)
GLUCOSE: 103 mg/dL — AB (ref 65–99)
POTASSIUM: 4.6 mmol/L (ref 3.5–5.1)
Sodium: 135 mmol/L (ref 135–145)

## 2015-07-29 LAB — GLUCOSE, CAPILLARY
GLUCOSE-CAPILLARY: 209 mg/dL — AB (ref 65–99)
Glucose-Capillary: 93 mg/dL (ref 65–99)
Glucose-Capillary: 188 mg/dL — ABNORMAL HIGH (ref 65–99)
Glucose-Capillary: 273 mg/dL — ABNORMAL HIGH (ref 65–99)

## 2015-07-29 MED ORDER — BISACODYL 10 MG RE SUPP
10.0000 mg | RECTAL | Status: AC
  Administered 2015-07-29: 10 mg via RECTAL
  Filled 2015-07-29: qty 1

## 2015-07-29 MED ORDER — MAGNESIUM HYDROXIDE 400 MG/5ML PO SUSP: 30.0000 mL | Freq: Two times a day (BID) | ORAL | Status: DC | PRN

## 2015-07-29 MED ORDER — SENNOSIDES-DOCUSATE SODIUM 8.6-50 MG PO TABS
1.0000 | ORAL_TABLET | Freq: Two times a day (BID) | ORAL | Status: DC
  Administered 2015-07-29 – 2015-07-31 (×6): 1 via ORAL
  Filled 2015-07-29 (×5): qty 1

## 2015-07-29 MED ORDER — POLYETHYLENE GLYCOL 3350 17 G PO PACK
17.0000 g | PACK | Freq: Two times a day (BID) | ORAL | Status: DC
  Administered 2015-07-29: 17 g via ORAL
  Filled 2015-07-29 (×2): qty 1

## 2015-07-29 MED ORDER — POLYETHYLENE GLYCOL 3350 17 G PO PACK
17.0000 g | PACK | Freq: Every day | ORAL | Status: DC | PRN
  Administered 2015-07-29: 17 g via ORAL
  Filled 2015-07-29: qty 1

## 2015-07-29 NOTE — Progress Notes (Signed)
   Name: [name] MRN: [id number] DOB: 14-Dec-1950    ADMISSION DATE:  07/21/2015 CONSULTATION DATE: 07/26/15  REFERRING MD :  Curly Rim  CHIEF COMPLAINT:  Shortness of breath  BRIEF PATIENT DESCRIPTION: 65 year old female with PMH significant for Asthma,DM, HTN, Dyslipidemia, Bronchitis and GERD  Presented with Asthama exacerbation .  She is very slowly improving and experienced some shortness of breath along with chest pain on 3/25. Troponins were elevated.  PCCM team was consulted on 3/26 for increased SOB.  SIGNIFICANT EVENTS  3/22 - Admit  STUDIES:   3/26 CT chest >> Negative for PE  3/26 duplex of LE>> Negative.  3/27 ECHO> Normal with EF OF 60 -65%  SUBJECTIVE:  Patient c/o SEVERE abdominal pain from constipation. Pacing in the room crying.  REVIEW OF SYSTEMS:  Patient denies any chest pain or pressure.  VITAL SIGNS: Temp:  [98.2 F (36.8 C)-98.6 F (37 C)] 98.2 F (36.8 C) (03/30 0518) Pulse Rate:  [78-90] 90 (03/30 0730) Resp:  [18-19] 18 (03/30 0518) BP: (151-188)/(65-86) 168/74 mmHg (03/30 0730) SpO2:  [95 %-97 %] 96 % (03/30 0518)  PHYSICAL EXAMINATION: General:  Awake. Significant discomfort. Neuro:  A&Ox4. Grossly nonfocal. Skin:  Warm & dry. No rash on exposed skin.   Recent Labs Lab 07/27/15 0524 07/28/15 0509 07/29/15 0519  NA 136 138 135  K 4.0 4.0 4.6  CL 96* 100* 100*  CO2 32 31 25  BUN 24* 25* 24*  CREATININE 1.02* 1.06* 0.89  GLUCOSE 217* 117* 103*    Recent Labs Lab 07/27/15 0524 07/28/15 0509 07/29/15 0519  HGB 11.8* 11.6* 12.0  HCT 36.6 34.8* 35.1*  WBC 15.1* 15.0* 14.4*  PLT 347 277 281   No results found.  ASSESSMENT / PLAN: 65 year old female with asthma exacerbation. Slow clinical improvement. Tolerating oral steroids. Spoke with nurse who is addressing constipation with primary service. Plan to reassess patient tomorrow.  1. Asthma Exacerbation: Budesonide nebs bid along with Duoneb tid. Empiric Levaquin Day  #7/7. Continuing Prednisone 60mg  daily.Plan for a slow two-week taper. 2. RUL Nodule: Patient will need f/u CT imaging as outpatient. 3. H/O GERD: Nexium & Pepcid PO.  Britiany Silbernagel E. Ashok Cordia, M.D. [facility] Pulmonary & Critical Care Pager:  [phone] After 3pm or if no response, call [phone] 10:19 AM 07/29/2015

## 2015-07-29 NOTE — Progress Notes (Signed)
Patient ID: [name], female   DOB: [birth year], 65 y.o.   MRN: [id number]  [facility] PROGRESS NOTE  ATHLENE RINGQUIST [id number] DOB: June 21, 1950 DOA: 07/21/2015 PCP: [name], MD   Brief narrative:    [name] with a past medical history of asthma, presented with complaints of shortness of breath. Patient was thought to have asthma exacerbation and she was hospitalized for further management. Patient has been very slow to improve. On the night of 3/25 she developed worsening shortness of breath along with chest pain. PCCM consulted for assistance.   Assessment/Plan:    Acute Asthma exacerbation / Right upper lung nodule - continues to improve but slow - continue Levaquin day #7/7 - continue Prednisone with slow taper over the next two weeks  - Continue Albuterol/Duoneb, Pulmicort increased to 0.5 - will need follow up CT chest for further evaluation of nodule  - appreciate PCCM team assistance   Chest Pain/Elevated trop - likely secondary to asthma exacerbation and pleurisy - no chest pain this AM  Constipation - placed on Miralax BID, Senokot BID - added suppository this AM to see it will help   Acute kidney injury - due to Lasix, resolved   4 Millimeter right upper lobe lung nodule - will need outpatient follow up   Left ear pain - resolved   Diabetes mellitus type II uncontrolled - can resume oral regimen upon discharge   Essential Hypertension - Blood pressure is noted to be elevated - resumed ARB  Hyperlipidemia - Continue statin  GERD - Continue Pepcid  DVT prophylaxis - Lovenox SQ  Code Status: Full.  Family Communication:  plan of care discussed with the patient Disposition Plan: Home when cleared by PCCM, possibly by 4/1  IV access:  Peripheral IV  Procedures and diagnostic studies:    Ct Angio Chest Pe W/cm &/or Wo Cm 07/25/2015  No evidence of a pulmonary embolism. 2. No acute findings. 3. 4 mm right  upper lobe nodule.   Dg Chest Port 1 View 07/24/2015  No active disease.  Dg Chest Port 1 View 07/21/2015  Decreased bronchitic changes.  No airspace consolidation.  Medical Consultants:  None  Other Consultants:  None  IAnti-Infectives:   Levaquin day #6/7  Faye Ramsay, MD  [facility] Pager [phone]  If 7PM-7AM, please contact night-coverage [email or web address] Password [facility] 07/29/2015, 12:53 PM   LOS: 7 days   HPI/Subjective: No events overnight. Crying due to constipation.   Objective: Filed Vitals:   07/28/15 2154 07/29/15 0518 07/29/15 0728 07/29/15 0730  BP: 151/65 188/79 172/84 168/74  Pulse: 83 78 90 90  Temp: 98.3 F (36.8 C) 98.2 F (36.8 C)    TempSrc: Oral Oral    Resp: 18 18    Height:      Weight:      SpO2: 96% 96%      Intake/Output Summary (Last 24 hours) at 07/29/15 1253 Last data filed at 07/29/15 0600  Gross per 24 hour  Intake    660 ml  Output      0 ml  Net    660 ml    Exam:   General:  Pt is alert, follows commands appropriately, not in acute distress  Cardiovascular: Regular rate and rhythm, no rubs, no gallops  Respiratory: upper resp wheezing, diminished breath sounds at bases   Abdomen: Soft, non tender, non distended, bowel sounds present, no guarding  Extremities: No edema, pulses DP and PT palpable bilaterally  Neuro: Grossly nonfocal  Data Reviewed: Basic Metabolic Panel:  Recent Labs Lab 07/25/15 0558 07/26/15 0427 07/27/15 0524 07/28/15 0509 07/29/15 0519  NA 136 137 136 138 135  K 3.5 3.4* 4.0 4.0 4.6  CL 98* 95* 96* 100* 100*  CO2 28 30 32 31 25  GLUCOSE 249* 245* 217* 117* 103*  BUN 18 24* 24* 25* 24*  CREATININE 0.92 1.09* 1.02* 1.06* 0.89  CALCIUM 8.4* 8.7* 8.9 8.7* 8.4*   CBC:  Recent Labs Lab 07/25/15 0558 07/26/15 0427 07/27/15 0524 07/28/15 0509 07/29/15 0519  WBC 14.1* 14.3* 15.1* 15.0* 14.4*  HGB 12.0 11.7* 11.8* 11.6* 12.0  HCT 35.1* 36.0 36.6 34.8* 35.1*  MCV 92.9 92.3 92.7 93.3  92.6  PLT 281 319 347 277 281   Cardiac Enzymes:  Recent Labs Lab 07/24/15 2344 07/25/15 0558 07/25/15 1114 07/26/15 0427  TROPONINI <0.03 0.11* 0.13* 0.07*   CBG:  Recent Labs Lab 07/28/15 1205 07/28/15 1636 07/28/15 2158 07/29/15 0803 07/29/15 1204  GLUCAP 177* 248* 200* 93 188*   Scheduled Meds: . aspirin EC  81 mg Oral q morning - 10a  . atorvastatin  40 mg Oral q morning - 10a  . benzonatate  200 mg Oral TID  . budesonide nebulizer   0.5 mg Nebulization BID  . enoxaparin  injection  40 mg Subcutaneous QHS  . esomeprazole  40 mg Oral QAC breakfast  . estradiol  1 mg Oral q morning - 10a  . famotidine  20 mg Oral BID  . insulin aspart  0-5 Units Subcutaneous QHS  . insulin aspart  0-9 Units Subcutaneous TID WC  . insulin glargine  10 Units Subcutaneous Daily  . insulin glargine  8 Units Subcutaneous QHS  . ipratropium-albuterol  3 mL Nebulization BID  . irbesartan  300 mg Oral Daily  . levofloxacin  750 mg Oral Daily  . loratadine  10 mg Oral Daily  . potassium chloride SA  20 mEq Oral q morning - 10a  . predniSONE  60 mg Oral Q breakfast   Continuous Infusions:

## 2015-07-29 NOTE — Care Management Important Message (Signed)
Important Message  Patient Details  Name: [name] MRN: [id number] Date of Birth: 04/01/1951   Medicare Important Message Given:  Yes    Nathen May 07/29/2015, 10:42 AM

## 2015-07-30 DIAGNOSIS — R0789 Other chest pain: Secondary | ICD-10-CM

## 2015-07-30 DIAGNOSIS — R600 Localized edema: Secondary | ICD-10-CM

## 2015-07-30 LAB — GLUCOSE, CAPILLARY
GLUCOSE-CAPILLARY: 105 mg/dL — AB (ref 65–99)
GLUCOSE-CAPILLARY: 248 mg/dL — AB (ref 65–99)
Glucose-Capillary: 189 mg/dL — ABNORMAL HIGH (ref 65–99)
Glucose-Capillary: 298 mg/dL — ABNORMAL HIGH (ref 65–99)

## 2015-07-30 LAB — CBC
HCT: 33.7 % — ABNORMAL LOW (ref 36.0–46.0)
Hemoglobin: 11.1 g/dL — ABNORMAL LOW (ref 12.0–15.0)
MCH: 31 pg (ref 26.0–34.0)
MCHC: 32.9 g/dL (ref 30.0–36.0)
MCV: 94.1 fL (ref 78.0–100.0)
PLATELETS: 261 10*3/uL (ref 150–400)
RBC: 3.58 MIL/uL — AB (ref 3.87–5.11)
RDW: 14 % (ref 11.5–15.5)
WBC: 14.1 10*3/uL — ABNORMAL HIGH (ref 4.0–10.5)

## 2015-07-30 LAB — BASIC METABOLIC PANEL
Anion gap: 6 (ref 5–15)
BUN: 18 mg/dL (ref 6–20)
CHLORIDE: 104 mmol/L (ref 101–111)
CO2: 26 mmol/L (ref 22–32)
CREATININE: 0.86 mg/dL (ref 0.44–1.00)
Calcium: 8.3 mg/dL — ABNORMAL LOW (ref 8.9–10.3)
GFR calc non Af Amer: >60 mL/min (ref >60)
Glucose, Bld: 109 mg/dL — ABNORMAL HIGH (ref 65–99)
POTASSIUM: 4.1 mmol/L (ref 3.5–5.1)
Sodium: 136 mmol/L (ref 135–145)

## 2015-07-30 LAB — BRAIN NATRIURETIC PEPTIDE: B NATRIURETIC PEPTIDE 5: 57.6 pg/mL (ref 0.0–100.0)

## 2015-07-30 MED ORDER — FUROSEMIDE 40 MG PO TABS
40.0000 mg | ORAL_TABLET | Freq: Once | ORAL | Status: AC
  Administered 2015-07-30: 40 mg via ORAL
  Filled 2015-07-30: qty 1

## 2015-07-30 NOTE — Progress Notes (Signed)
   Name: [name] MRN: [id number] DOB: 1950-11-19    ADMISSION DATE:  07/21/2015 CONSULTATION DATE: 07/26/15  REFERRING MD :  Curly Rim  CHIEF COMPLAINT:  Shortness of breath  BRIEF PATIENT DESCRIPTION: 65 year old female with PMH significant for Asthma,DM, HTN, Dyslipidemia, Bronchitis and GERD  Presented with Asthama exacerbation .  She is very slowly improving and experienced some shortness of breath along with chest pain on 3/25. Troponins were elevated.  PCCM team was consulted on 3/26 for increased SOB.  SIGNIFICANT EVENTS  3/22 - Admit  STUDIES:   3/26 CT chest >> Negative for PE  3/26 duplex of LE>> Negative.  3/27 ECHO> Normal with EF OF 60 -65%  SUBJECTIVE:  No acute events overnight. Had bowel movement. Denies any chest pain but does still have chest tightness. Mild wheezing. Dyspnea with ambulation persists. Does still have bilateral lower extremity edema.  REVIEW OF SYSTEMS:  No nausea, emesis, or abdominal pain. No fever or chills.  VITAL SIGNS: Temp:  [98.3 F (36.8 C)-98.9 F (37.2 C)] 98.9 F (37.2 C) (03/31 0544) Pulse Rate:  [86-94] 91 (03/31 0544) Resp:  [16-20] 16 (03/31 0544) BP: (157-191)/(67-75) 157/72 mmHg (03/31 0544) SpO2:  [95 %-98 %] 95 % (03/31 0544)  PHYSICAL EXAMINATION: General:  Awake. Alert. No distress. Integument:  Warm & dry. No rash on exposed skin. Neuro:  A&Ox4. Grossly nonfocal. Cardiovascular:  Regular rate & rhythm. Bilateral lower extremity edema. Pulmonary:  Normal work of breathing on room air at rest. Mild bilateral wheezing.  Abdomen:  Soft. Protuberant. Normal bowel sounds.   Recent Labs Lab 07/28/15 0509 07/29/15 0519 07/30/15 0556  NA 138 135 136  K 4.0 4.6 4.1  CL 100* 100* 104  CO2 31 25 26   BUN 25* 24* 18  CREATININE 1.06* 0.89 0.86  GLUCOSE 117* 103* 109*    Recent Labs Lab 07/28/15 0509 07/29/15 0519 07/30/15 0556  HGB 11.6* 12.0 11.1*  HCT 34.8* 35.1* 33.7*  WBC 15.0* 14.4* 14.1*  PLT 277  281 261   No results found.  ASSESSMENT / PLAN: 65 year old female with asthma exacerbation. Slow clinical improvement. Tolerating oral steroids. Suspect wheezing could be due to some element of fluid overload. TTE without gross diastolic or systolic dysfunction but patient 4+ liters positive if I/O's are to be believed. Asthma seems to be improving.   1. Asthma Exacerbation: Budesonide nebs bid along with Duoneb tid. D/C Levaquin today.  Prednisone 60mg  daily. Plan for a slow two-week taper. 2. Edema:  Suspect volume overload. Checking BNP. Lasix 40mg  po x1 now. 3. RUL Nodule: Patient will need f/u CT imaging as outpatient. 4. H/O GERD: Nexium & Pepcid PO. 5. Follow-up:  [facility] with Eric Form, NP on 4/14 @ 2pm.  The patient will not be seen over the weekend if still admitted. Contact the on-call Pulmonary Rounding Physician if there are any questions or issues before Monday.   Sonia Baller Ashok Cordia, M.D. [facility] Pulmonary & Critical Care Pager:  [phone] After 3pm or if no response, call [phone] 12:26 PM 07/30/2015

## 2015-07-30 NOTE — Progress Notes (Signed)
Patient ID: [name], female   DOB: 1950/08/09, 65 y.o.   MRN: [id number]  [facility] PROGRESS NOTE  SHABRE SEEBECK [id number] DOB: 1951-03-01 DOA: 07/21/2015 PCP: Jacqueline Frees, MD   Brief narrative:    [name] with a past medical history of asthma, presented with complaints of shortness of breath. Patient was thought to have asthma exacerbation and she was hospitalized for further management. Patient has been very slow to improve. On the night of 3/25 she developed worsening shortness of breath along with chest pain. PCCM consulted for assistance.   Assessment/Plan:    Acute Asthma exacerbation / Right upper lung nodule - continues to improve, appears to be now at her baseline  - completed 7 days of Levaquin  - continue Prednisone with slow taper over the next two weeks  - Continue Albuterol/Duoneb, Pulmicort increased to 0.5 - will need follow up CT chest for further evaluation of nodule  - appreciate PCCM team assistance   Chest Pain/Elevated trop - likely secondary to asthma exacerbation and pleurisy - no chest pain this AM  Constipation - placed on Miralax BID, Senokot BID - resolved with current regimen   Acute kidney injury - due to Lasix, resolved  - will need additional dose of Lasix due to some LE edema  - per record review 4 L positive   4 Millimeter right upper lobe lung nodule - will need outpatient follow up   Left ear pain - resolved   Diabetes mellitus type II uncontrolled - can resume oral regimen upon discharge   Essential Hypertension - Blood pressure is noted to be elevated - resumed ARB  Hyperlipidemia - Continue statin  GERD - Continue Pepcid  DVT prophylaxis - Lovenox SQ  Code Status: Full.  Family Communication:  plan of care discussed with the patient Disposition Plan: Home 4/1  IV access:  Peripheral IV  Procedures and diagnostic studies:    Ct Angio Chest Pe W/cm &/or Wo Cm 07/25/2015   No evidence of a pulmonary embolism. 2. No acute findings. 3. 4 mm right upper lobe nodule.   Dg Chest Port 1 View 07/24/2015  No active disease.  Dg Chest Port 1 View 07/21/2015  Decreased bronchitic changes.  No airspace consolidation.  Medical Consultants:  None  Other Consultants:  None  IAnti-Infectives:   Levaquin completed   Jacqueline Ramsay, MD  [place] Pager [phone]  If 7PM-7AM, please contact night-coverage [email or web address] Password [email or web address] 07/30/2015, 4:03 PM   LOS: 8 days   HPI/Subjective: No events overnight. Crying due to constipation.   Objective: Filed Vitals:   07/29/15 2148 07/30/15 0005 07/30/15 0544 07/30/15 1507  BP:  171/71 157/72 157/72  Pulse:  89 91 97  Temp:   98.9 F (37.2 C) 98.6 F (37 C)  TempSrc:   Oral Oral  Resp:  18 16 18   Height:      Weight:      SpO2: 97% 98% 95% 97%    Intake/Output Summary (Last 24 hours) at 07/30/15 1603 Last data filed at 07/30/15 1016  Gross per 24 hour  Intake    340 ml  Output      0 ml  Net    340 ml    Exam:   General:  Pt is alert, follows commands appropriately, not in acute distress  Cardiovascular: Regular rate and rhythm, no rubs, no gallops  Respiratory: upper resp wheezing, diminished breath sounds at bases   Abdomen: Soft, non tender, non distended, bowel  sounds present, no guarding  Extremities: LE edema +1, pulses DP and PT palpable bilaterally  Neuro: Grossly nonfocal  Data Reviewed: Basic Metabolic Panel:  Recent Labs Lab 07/26/15 0427 07/27/15 0524 07/28/15 0509 07/29/15 0519 07/30/15 0556  NA 137 136 138 135 136  K 3.4* 4.0 4.0 4.6 4.1  CL 95* 96* 100* 100* 104  CO2 30 32 31 25 26   GLUCOSE 245* 217* 117* 103* 109*  BUN 24* 24* 25* 24* 18  CREATININE 1.09* 1.02* 1.06* 0.89 0.86  CALCIUM 8.7* 8.9 8.7* 8.4* 8.3*   CBC:  Recent Labs Lab 07/26/15 0427 07/27/15 0524 07/28/15 0509 07/29/15 0519 07/30/15 0556  WBC 14.3* 15.1* 15.0* 14.4* 14.1*  HGB 11.7* 11.8*  11.6* 12.0 11.1*  HCT 36.0 36.6 34.8* 35.1* 33.7*  MCV 92.3 92.7 93.3 92.6 94.1  PLT 319 347 277 281 261   Cardiac Enzymes:  Recent Labs Lab 07/24/15 2344 07/25/15 0558 07/25/15 1114 07/26/15 0427  TROPONINI <0.03 0.11* 0.13* 0.07*   CBG:  Recent Labs Lab 07/29/15 1204 07/29/15 1734 07/29/15 2142 07/30/15 0758 07/30/15 1158  GLUCAP 188* 273* 209* 105* 189*   Scheduled Meds: . aspirin EC  81 mg Oral q morning - 10a  . atorvastatin  40 mg Oral q morning - 10a  . benzonatate  200 mg Oral TID  . budesonide nebulizer   0.5 mg Nebulization BID  . enoxaparin  injection  40 mg Subcutaneous QHS  . esomeprazole  40 mg Oral QAC breakfast  . estradiol  1 mg Oral q morning - 10a  . famotidine  20 mg Oral BID  . insulin aspart  0-5 Units Subcutaneous QHS  . insulin aspart  0-9 Units Subcutaneous TID WC  . insulin glargine  10 Units Subcutaneous Daily  . insulin glargine  8 Units Subcutaneous QHS  . ipratropium-albuterol  3 mL Nebulization BID  . irbesartan  300 mg Oral Daily  . levofloxacin  750 mg Oral Daily  . loratadine  10 mg Oral Daily  . potassium chloride SA  20 mEq Oral q morning - 10a  . predniSONE  60 mg Oral Q breakfast   Continuous Infusions:

## 2015-07-31 LAB — CBC
HEMATOCRIT: 35.8 % — AB (ref 36.0–46.0)
HEMOGLOBIN: 11.4 g/dL — AB (ref 12.0–15.0)
MCH: 29.9 pg (ref 26.0–34.0)
MCHC: 31.8 g/dL (ref 30.0–36.0)
MCV: 94 fL (ref 78.0–100.0)
Platelets: 309 10*3/uL (ref 150–400)
RBC: 3.81 MIL/uL — ABNORMAL LOW (ref 3.87–5.11)
RDW: 14 % (ref 11.5–15.5)
WBC: 16.4 10*3/uL — ABNORMAL HIGH (ref 4.0–10.5)

## 2015-07-31 LAB — RENAL FUNCTION PANEL
ALBUMIN: 3 g/dL — AB (ref 3.5–5.0)
ANION GAP: 6 (ref 5–15)
BUN: 19 mg/dL (ref 6–20)
CO2: 31 mmol/L (ref 22–32)
Calcium: 8.9 mg/dL (ref 8.9–10.3)
Chloride: 97 mmol/L — ABNORMAL LOW (ref 101–111)
Creatinine, Ser: 1.01 mg/dL — ABNORMAL HIGH (ref 0.44–1.00)
GFR calc Af Amer: >60 mL/min (ref >60)
GFR calc non Af Amer: 57 mL/min — ABNORMAL LOW (ref >60)
GLUCOSE: 136 mg/dL — AB (ref 65–99)
PHOSPHORUS: 4 mg/dL (ref 2.5–4.6)
POTASSIUM: 4.2 mmol/L (ref 3.5–5.1)
Sodium: 134 mmol/L — ABNORMAL LOW (ref 135–145)

## 2015-07-31 LAB — MAGNESIUM: Magnesium: 1.9 mg/dL (ref 1.7–2.4)

## 2015-07-31 LAB — GLUCOSE, CAPILLARY: Glucose-Capillary: 111 mg/dL — ABNORMAL HIGH (ref 65–99)

## 2015-07-31 MED ORDER — LORATADINE 10 MG PO TABS: 10.0000 mg | ORAL_TABLET | Freq: Every day | ORAL | Status: DC

## 2015-07-31 MED ORDER — IRBESARTAN 300 MG PO TABS: 300.0000 mg | ORAL_TABLET | Freq: Every day | ORAL | Status: DC

## 2015-07-31 MED ORDER — BENZONATATE 200 MG PO CAPS: 200.0000 mg | ORAL_CAPSULE | Freq: Three times a day (TID) | ORAL | Status: DC

## 2015-07-31 MED ORDER — ALBUTEROL SULFATE (2.5 MG/3ML) 0.083% IN NEBU: 2.5000 mg | INHALATION_SOLUTION | RESPIRATORY_TRACT | Status: DC | PRN

## 2015-07-31 MED ORDER — HYDROCOD POLST-CPM POLST ER 10-8 MG/5ML PO SUER: 5.0000 mL | Freq: Two times a day (BID) | ORAL | Status: DC | PRN

## 2015-07-31 MED ORDER — SENNOSIDES-DOCUSATE SODIUM 8.6-50 MG PO TABS: 1.0000 | ORAL_TABLET | Freq: Two times a day (BID) | ORAL | Status: DC

## 2015-07-31 MED ORDER — HYDROCODONE-ACETAMINOPHEN 5-325 MG PO TABS: 1.0000 | ORAL_TABLET | ORAL | Status: DC | PRN

## 2015-07-31 MED ORDER — ALPRAZOLAM 0.5 MG PO TABS: 0.5000 mg | ORAL_TABLET | Freq: Two times a day (BID) | ORAL | Status: DC | PRN

## 2015-07-31 MED ORDER — GUAIFENESIN ER 600 MG PO TB12: 600.0000 mg | ORAL_TABLET | Freq: Two times a day (BID) | ORAL | Status: DC

## 2015-07-31 MED ORDER — PREDNISONE 10 MG PO TABS: ORAL_TABLET | ORAL | Status: DC

## 2015-07-31 MED ORDER — BUDESONIDE 0.5 MG/2ML IN SUSP: 0.5000 mg | Freq: Two times a day (BID) | RESPIRATORY_TRACT | Status: DC

## 2015-07-31 NOTE — Discharge Instructions (Signed)
Asthma Attack Prevention While you may not be able to control the fact that you have asthma, you can take actions to prevent asthma attacks. The best way to prevent asthma attacks is to maintain good control of your asthma. You can achieve this by:  Taking your medicines as directed.  Avoiding things that can irritate your airways or make your asthma symptoms worse (asthma triggers).  Keeping track of how well your asthma is controlled and of any changes in your symptoms.  Responding quickly to worsening asthma symptoms (asthma attack).  Seeking emergency care when it is needed. WHAT ARE SOME WAYS TO PREVENT AN ASTHMA ATTACK? Have a Plan Work with your health care provider to create a written plan for managing and treating your asthma attacks (asthma action plan). This plan includes:  A list of your asthma triggers and how you can avoid them.  Information on when medicines should be taken and when their dosages should be changed.  The use of a device that measures how well your lungs are working (peak flow meter). Monitor Your Asthma Use your peak flow meter and record your results in a journal every day. A drop in your peak flow numbers on one or more days may indicate the start of an asthma attack. This can happen even before you start to feel symptoms. You can prevent an asthma attack from getting worse by following the steps in your asthma action plan. Avoid Asthma Triggers Work with your asthma health care provider to find out what your asthma triggers are. This can be done by:  Allergy testing.  Keeping a journal that notes when asthma attacks occur and the factors that may have contributed to them.  Determining if there are other medical conditions that are making your asthma worse. Once you have determined your asthma triggers, take steps to avoid them. This may include avoiding excessive or prolonged exposure to:  Dust. Have someone dust and vacuum your home for you once or  twice a week. Using a high-efficiency particulate arrestance (HEPA) vacuum is best.  Smoke. This includes campfire smoke, forest fire smoke, and secondhand smoke from tobacco products.  Pet dander. Avoid contact with animals that you know you are allergic to.  Allergens from trees, grasses or pollens. Avoid spending a lot of time outdoors when pollen counts are high, and on very windy days.  Very cold, dry, or humid air.  Mold.  Foods that contain high amounts of sulfites.  Strong odors.  Outdoor air pollutants, such as engine exhaust.  Indoor air pollutants, such as aerosol sprays and fumes from household cleaners.  Household pests, including dust mites and cockroaches, and pest droppings.  Certain medicines, including NSAIDs. Always talk to your health care provider before stopping or starting any new medicines. Medicines Take over-the-counter and prescription medicines only as told by your health care provider. Many asthma attacks can be prevented by carefully following your medicine schedule. Taking your medicines correctly is especially important when you cannot avoid certain asthma triggers. Act Quickly If an asthma attack does happen, acting quickly can decrease how severe it is and how long it lasts. Take these steps:   Pay attention to your symptoms. If you are coughing, wheezing, or having difficulty breathing, do not wait to see if your symptoms go away on their own. Follow your asthma action plan.  If you have followed your asthma action plan and your symptoms are not improving, call your health care provider or seek immediate medical care   at the nearest hospital. It is important to note how often you need to use your fast-acting rescue inhaler. If you are using your rescue inhaler more often, it may mean that your asthma is not under control. Adjusting your asthma treatment plan may help you to prevent future asthma attacks and help you to gain better control of your  condition. HOW CAN I PREVENT AN ASTHMA ATTACK WHEN I EXERCISE? Follow advice from your health care provider about whether you should use your fast-acting inhaler before exercising. Many people with asthma experience exercise-induced bronchoconstriction (EIB). This condition often worsens during vigorous exercise in cold, humid, or dry environments. Usually, people with EIB can stay very active by pre-treating with a fast-acting inhaler before exercising.   This information is not intended to replace advice given to you by your health care provider. Make sure you discuss any questions you have with your health care provider.   Document Released: 04/05/2009 Document Revised: 01/06/2015 Document Reviewed: 09/17/2014 Elsevier Interactive Patient Education 2016 [employer]  

## 2015-07-31 NOTE — Discharge Summary (Signed)
Physician Discharge Summary  [name] [id number] DOB: [birth year] DOA: 07/21/2015  PCP: [name], MD  Admit date: 07/21/2015 Discharge date: 07/31/2015  Recommendations for Outpatient Follow-up:  1. Pt will need to follow up with PCP in 2-3 weeks post discharge 2. Please obtain BMP to evaluate electrolytes and kidney function 3. Please also check CBC to evaluate Hg and Hct levels 4. Please note that slow Prednisone taper recommended per PCCM team, pt also advised she needs close follow up with pulmonologist, she has verbalized understanding  5. Pt will need follow up CT chest to re evaluate pulm nodule, RUL 4 mm nodule   Discharge Diagnoses:  Active Problems:   DM (diabetes mellitus) type 2, uncontrolled, with ketoacidosis (HCC)   HYPERCHOLESTEROLEMIA   Essential hypertension   G E R D   Asthma exacerbation   Chest pain   Dyspnea  Discharge Condition: Stable  Diet recommendation: Heart healthy diet discussed in details    Brief narrative:    [name] with a past medical history of asthma, presented with complaints of shortness of breath. Patient was thought to have asthma exacerbation and she was hospitalized for further management. Patient has been very slow to improve. On the night of 3/25 she developed worsening shortness of breath along with chest pain. PCCM consulted for assistance.   Assessment/Plan:    Acute Asthma exacerbation / Right upper lung nodule - continues to improve, appears to be now at her baseline  - completed 7 days of Levaquin  - continue Prednisone with slow taper over the next two weeks  - will need follow up CT chest for further evaluation of nodule  - appreciate PCCM team assistance   Chest Pain/Elevated trop - likely secondary to asthma exacerbation and pleurisy - no chest pain this AM  Constipation - placed on Miralax BID, Senokot BID - resolved   Acute kidney injury - due to Lasix, resolved    4 Millimeter right upper lobe lung nodule - will need outpatient follow up   Left ear pain - resolved   Diabetes mellitus type II uncontrolled - can resume oral regimen upon discharge   Essential Hypertension - Blood pressure is noted to be elevated - resumed ARB  Hyperlipidemia - Continue statin  GERD - Continue Pepcid  Code Status: Full.  Family Communication: plan of care discussed with the patient Disposition Plan: Home   IV access:  Peripheral IV  Procedures and diagnostic studies:   Ct Angio Chest Pe W/cm &/or Wo Cm 07/25/2015 No evidence of a pulmonary embolism. 2. No acute findings. 3. 4 mm right upper lobe nodule.   Dg Chest Port 1 View 07/24/2015 No active disease.  Dg Chest Port 1 View 07/21/2015 Decreased bronchitic changes. No airspace consolidation.  Medical Consultants:  None  Other Consultants:  None  IAnti-Infectives:   Levaquin completed      Discharge Exam: Filed Vitals:   07/30/15 2129 07/31/15 0452  BP: 168/79 186/83  Pulse: 88 81  Temp: 98 F (36.7 C) 98.2 F (36.8 C)  Resp: 18 18   Filed Vitals:   07/30/15 1507 07/30/15 1938 07/30/15 2129 07/31/15 0452  BP: 157/72  168/79 186/83  Pulse: 97  88 81  Temp: 98.6 F (37 C)  98 F (36.7 C) 98.2 F (36.8 C)  TempSrc: Oral  Oral Oral  Resp: 18  18 18   Height:      Weight:      SpO2: 97% 97% 95% 100%  General: Pt is alert, follows commands appropriately, not in acute distress Cardiovascular: Regular rate and rhythm, S1/S2 +, no murmurs, no rubs, no gallops Respiratory: Clear to auscultation bilaterally, no wheezing, no crackles, no rhonchi Abdominal: Soft, non tender, non distended, bowel sounds +, no guarding Extremities: trace bilateral LE edema, no cyanosis, pulses palpable bilaterally DP and PT Neuro: Grossly nonfocal  Discharge Instructions  Discharge Instructions    Diet - low sodium heart healthy    Complete by:  As directed       Increase activity slowly    Complete by:  As directed             Medication List    STOP taking these medications        valsartan-hydrochlorothiazide 320-25 MG tablet  Commonly known as:  DIOVAN-HCT      TAKE these medications        albuterol 108 (90 Base) MCG/ACT inhaler  Commonly known as:  PROVENTIL HFA;VENTOLIN HFA  Inhale 2 puffs into the lungs every 6 (six) hours as needed for wheezing or shortness of breath.     albuterol (2.5 MG/3ML) 0.083% nebulizer solution  Commonly known as:  PROVENTIL  Take 3 mLs (2.5 mg total) by nebulization every 4 (four) hours as needed for shortness of breath.     ALPRAZolam 0.5 MG tablet  Commonly known as:  XANAX  Take 1 tablet (0.5 mg total) by mouth 2 (two) times daily as needed for anxiety.     aspirin EC 81 MG tablet  Take 81 mg by mouth every morning.     atorvastatin 40 MG tablet  Commonly known as:  LIPITOR  Take 40 mg by mouth every morning.     benzonatate 200 MG capsule  Commonly known as:  TESSALON  Take 1 capsule (200 mg total) by mouth 3 (three) times daily.     budesonide 0.5 MG/2ML nebulizer solution  Commonly known as:  PULMICORT  Take 2 mLs (0.5 mg total) by nebulization 2 (two) times daily.     chlorpheniramine-HYDROcodone 10-8 MG/5ML Suer  Commonly known as:  TUSSIONEX  Take 5 mLs by mouth every 12 (twelve) hours as needed for cough.     esomeprazole 40 MG capsule  Commonly known as:  NEXIUM  Take 40 mg by mouth 2 (two) times daily before a meal.     estradiol 1 MG tablet  Commonly known as:  ESTRACE  Take 1 mg by mouth every morning.     guaiFENesin 600 MG 12 hr tablet  Commonly known as:  MUCINEX  Take 1 tablet (600 mg total) by mouth 2 (two) times daily.     HYDROcodone-acetaminophen 5-325 MG tablet  Commonly known as:  NORCO/VICODIN  Take 1-2 tablets by mouth every 4 (four) hours as needed for moderate pain.     irbesartan 300 MG tablet  Commonly known as:  AVAPRO  Take 1 tablet (300 mg  total) by mouth daily.     loratadine 10 MG tablet  Commonly known as:  CLARITIN  Take 1 tablet (10 mg total) by mouth daily.     metFORMIN 500 MG tablet  Commonly known as:  GLUCOPHAGE  Take 500 mg by mouth daily with breakfast.     potassium chloride SA 20 MEQ tablet  Commonly known as:  K-DUR,KLOR-CON  Take 20 mEq by mouth every morning.     predniSONE 10 MG tablet  Commonly known as:  DELTASONE  Take 50 mg tablet 08/01/2015 and  taper down by 5 mg daily until completed     senna-docusate 8.6-50 MG tablet  Commonly known as:  Senokot-S  Take 1 tablet by mouth 2 (two) times daily.           Follow-up Information    Follow up with [name], MD.   Specialty:  Family Medicine   Contact information:   [address] [place] [address] [phone]       Call Faye Ramsay, MD.   Specialty:  Internal Medicine   Why:  call my cell phone [phone]   Contact information:   [address] [place] [address] [phone]        The results of significant diagnostics from this hospitalization (including imaging, microbiology, ancillary and laboratory) are listed below for reference.     Microbiology: No results found for this or any previous visit (from the past 240 hour(s)).   Labs: Basic Metabolic Panel:  Recent Labs Lab 07/27/15 0524 07/28/15 0509 07/29/15 0519 07/30/15 0556 07/31/15 0546  NA 136 138 135 136 134*  K 4.0 4.0 4.6 4.1 4.2  CL 96* 100* 100* 104 97*  CO2 32 31 25 26 31   GLUCOSE 217* 117* 103* 109* 136*  BUN 24* 25* 24* 18 19  CREATININE 1.02* 1.06* 0.89 0.86 1.01*  CALCIUM 8.9 8.7* 8.4* 8.3* 8.9  MG  --   --   --   --  1.9  PHOS  --   --   --   --  4.0   Liver Function Tests:  Recent Labs Lab 07/31/15 0546  ALBUMIN 3.0*   CBC:  Recent Labs Lab 07/27/15 0524 07/28/15 0509 07/29/15 0519 07/30/15 0556 07/31/15 0546  WBC 15.1* 15.0* 14.4* 14.1* 16.4*  HGB 11.8* 11.6* 12.0  11.1* 11.4*  HCT 36.6 34.8* 35.1* 33.7* 35.8*  MCV 92.7 93.3 92.6 94.1 94.0  PLT 347 277 281 261 309   Cardiac Enzymes:  Recent Labs Lab 07/24/15 2344 07/25/15 0558 07/25/15 1114 07/26/15 0427  TROPONINI <0.03 0.11* 0.13* 0.07*   BNP: BNP (last 3 results)  Recent Labs  07/21/15 1824 07/25/15 1533 07/30/15 1328  BNP 11.8 49.6 57.6   CBG:  Recent Labs Lab 07/30/15 0758 07/30/15 1158 07/30/15 1647 07/30/15 2253 07/31/15 0810  GLUCAP 105* 189* 298* 248* 111*   SIGNED: Time coordinating discharge: 30 minutes  MAGICK-Lillyann Ahart, MD  [facility] 07/31/2015, 9:48 AM Pager [phone]  If 7PM-7AM, please contact night-coverage [email or web address] Password [email or web address]

## 2015-07-31 NOTE — Progress Notes (Signed)
Discharge instructions and RX's explained/provided to patient verbalized understanding. Patient  Discharged home with family left floor via wheelchair accompanied by staff no c/o pain or sob at d/c.  Dmitri Pettigrew, Tivis Ringer, RN

## 2015-08-12 ENCOUNTER — Encounter: Payer: Self-pay | Admitting: Acute Care

## 2015-08-12 ENCOUNTER — Ambulatory Visit (INDEPENDENT_AMBULATORY_CARE_PROVIDER_SITE_OTHER): Payer: Medicare Other | Admitting: Acute Care

## 2015-08-12 VITALS — BP 144/82 | HR 81 | Ht 62.0 in | Wt 167.0 lb

## 2015-08-12 DIAGNOSIS — J45901 Unspecified asthma with (acute) exacerbation: Secondary | ICD-10-CM | POA: Diagnosis not present

## 2015-08-12 MED ORDER — MOMETASONE FURO-FORMOTEROL FUM 100-5 MCG/ACT IN AERO: 2.0000 | INHALATION_SPRAY | Freq: Two times a day (BID) | RESPIRATORY_TRACT | Status: DC

## 2015-08-12 MED ORDER — ALBUTEROL SULFATE HFA 108 (90 BASE) MCG/ACT IN AERS: 2.0000 | INHALATION_SPRAY | Freq: Four times a day (QID) | RESPIRATORY_TRACT | Status: DC | PRN

## 2015-08-12 NOTE — Progress Notes (Signed)
Subjective:    Patient ID: [name], female    DOB: 11-26-1950, 65 y.o.   MRN: [id number]  HPI 65 year old [place] American female former smoker with past medical history significant for asthma, 4 mm pulmonary nodule, GERD,DM Type II, HTN, and Hyperlipidemia followed by Dr. Melvyn Novas.  Significant Events/ Procedures:  Recent Hospitalization for acute asthma flare:Treated with IV steroids, ABX, and scheduled BD's ( Per patient son killed 2 months ago/ stress contributed)  07/25/2015: CT Angio:  IMPRESSION: 1. No evidence of a pulmonary embolism. 2. No acute findings. 3. 4 mm right upper lobe nodule.   PCP: [name], MD  Admit date: 07/21/2015 Discharge date: 07/31/2015  Recommendations for Outpatient Follow-up:  1. Pt will need to follow up with PCP in 2-3 weeks post discharge 2. Please obtain BMP to evaluate electrolytes and kidney function 3. Please also check CBC to evaluate Hg and Hct levels 4. Please note that slow Prednisone taper recommended per PCCM team, pt also advised she needs close follow up with pulmonologist, she has verbalized understanding  5. Pt will need follow up CT chest to re evaluate pulm nodule, RUL 4 mm nodule    08/12/2015: Hospital Follow Up: Pt. Returns to the office for hospital follow up. She is better, denies shortness of breath, wheezing, chest tightness, cough or purulent secretions. She started back to work 08/11/15. She has completed her prednisone taper and Levaquin treatment.She  was started on Dulera in the hospital, and sent home with a sample, but this medication was not placed on her med list. She states it is helping her so we add it to her med list with instruction to take 2 puffs twice daily, and confirm with Dr. Melvyn Novas he is in agreement with this.She is going to her PCP today for post hospital  lab work as described in the recommendations above.She Will need to have follow up CT in 6 months for nodule follow up.( 4 mm nodule noted  on CT Chest done as inpatient). Today she denies chest pain, orthopnea, hemoptysis , leg or calf pain.   Current outpatient prescriptions:  .  albuterol (PROVENTIL HFA;VENTOLIN HFA) 108 (90 Base) MCG/ACT inhaler, Inhale 2 puffs into the lungs every 6 (six) hours as needed for wheezing or shortness of breath., Disp: 1 Inhaler, Rfl: 5 .  albuterol (PROVENTIL) (2.5 MG/3ML) 0.083% nebulizer solution, Take 3 mLs (2.5 mg total) by nebulization every 4 (four) hours as needed for shortness of breath., Disp: 500 mL, Rfl: 1 .  ALPRAZolam (XANAX) 0.5 MG tablet, Take 1 tablet (0.5 mg total) by mouth 2 (two) times daily as needed for anxiety., Disp: 30 tablet, Rfl: 0 .  aspirin EC 81 MG tablet, Take 81 mg by mouth every morning. , Disp: , Rfl:  .  atorvastatin (LIPITOR) 40 MG tablet, Take 40 mg by mouth every morning. , Disp: , Rfl:  .  esomeprazole (NEXIUM) 40 MG capsule, Take 40 mg by mouth 2 (two) times daily before a meal. , Disp: , Rfl:  .  estradiol (ESTRACE) 1 MG tablet, Take 1 mg by mouth every morning. , Disp: , Rfl:  .  metFORMIN (GLUCOPHAGE) 500 MG tablet, Take 500 mg by mouth daily with breakfast., Disp: , Rfl:  .  potassium chloride SA (K-DUR,KLOR-CON) 20 MEQ tablet, Take 20 mEq by mouth every morning. , Disp: , Rfl:  .  valsartan (DIOVAN) 40 MG tablet, Take 40 mg by mouth daily., Disp: , Rfl:  .  mometasone-formoterol (  DULERA) 100-5 MCG/ACT AERO, Inhale 2 puffs into the lungs 2 (two) times daily., Disp: 1 Inhaler, Rfl: 5   Past Medical History  Diagnosis Date  . Asthma   . Diabetes mellitus   . Hypertension   . Dyslipidemia   . Pneumonia   . Bronchitis   . GERD (gastroesophageal reflux disease)     Allergies  Allergen Reactions  . Amlodipine Besylate Other (See Comments)    Reaction unknown  . Lisinopril Other (See Comments)    Reaction unknown  . Pantoprazole Sodium Other (See Comments)    Reaction unknown    Review of Systems Constitutional:   No  weight loss, night sweats,   Fevers, chills, fatigue, or  lassitude.  HEENT:   No headaches,  Difficulty swallowing,  Tooth/dental problems, or  Sore throat,                No sneezing, itching, ear ache, nasal congestion, post nasal drip,   CV:  No chest pain,  Orthopnea, PND, swelling in lower extremities, anasarca, dizziness, palpitations, syncope.   GI  No heartburn, indigestion, abdominal pain, nausea, vomiting, diarrhea, change in bowel habits, loss of appetite, bloody stools.   Resp: No shortness of breath with exertion or at rest.  No excess mucus, no productive cough,  No non-productive cough,  No coughing up of blood.  No change in color of mucus.  No wheezing.  No chest wall deformity  Skin: no rash or lesions.  GU: no dysuria, change in color of urine, no urgency or frequency.  No flank pain, no hematuria   MS:  No joint pain or swelling.  No decreased range of motion.  No back pain.  Psych:  No change in mood or affect. No depression or anxiety.  No memory loss.        Objective:   Physical Exam BP 144/82 mmHg  Pulse 81  Ht 5\' 2"  (1.575 m)  Wt 167 lb (75.751 kg)  BMI 30.54 kg/m2  SpO2 100%  Physical Exam:  General- No distress,  A&Ox3, looks well ENT: No sinus tenderness, TM clear, pale nasal mucosa, no oral exudate,no post nasal drip, no LAN Cardiac: S1, S2, regular rate and rhythm, no murmur Chest: No wheeze/ rales/ dullness; no accessory muscle use, no nasal flaring, no sternal retractions Abd.: Soft Non-tender Ext: No clubbing cyanosis, edema Neuro:  normal strength Skin: No rashes, warm and dry Psych: normal mood and behavior  Magdalen Spatz, AGACNP-BC [facility]  08/12/2015    Assessment & Plan:

## 2015-08-12 NOTE — Assessment & Plan Note (Addendum)
  Resolved asthma flare: Plan:  We will send in a prescription for a Proventil  Inhaler. We will send in a prescription for the [facility]. 2 puffs twice daily( started in hospital) Rinse your mouth with water after use. Continue your Nexium Follow up with Dr. Melvyn Novas in 6 months. You will need a follow up CT scan to re-evaluate the pulmonary nodule seen on CT this admission. Please contact office for sooner follow up if symptoms do not improve or worsen or seek emergency care

## 2015-08-12 NOTE — Patient Instructions (Addendum)
It is nice to see you today.  I am glad you are feeling better. We will send in a prescription for a Proventil  Inhaler. We will send in a prescription for the [facility]. 2 puffs twice daily Rinse your mouth with water after use. Continue your Nexium Follow up with Dr. Melvyn Novas in 6 months. You will need a follow up CT scan to re-evaluate the pulmonary nodule seen on CT this admission. Please contact office for sooner follow up if symptoms do not improve or worsen or seek emergency care

## 2015-08-13 ENCOUNTER — Inpatient Hospital Stay: Payer: Self-pay | Admitting: Acute Care

## 2015-08-31 ENCOUNTER — Telehealth: Payer: Self-pay | Admitting: Internal Medicine

## 2015-08-31 NOTE — Telephone Encounter (Signed)
LMTCB on home and cell

## 2015-08-31 NOTE — Telephone Encounter (Signed)
Patient states that she has been wheezing a lot for the past week. Having to use her rescue inhaler more often. No cough, no tightness in chest, just SOB and wheezing.   Pharmacy: [facility]  Allergies  Allergen Reactions  . Amlodipine Besylate Other (See Comments)    Reaction unknown  . Lisinopril Other (See Comments)    Reaction unknown  . Pantoprazole Sodium Other (See Comments)    Reaction unknown    Patient Instructions     It is nice to see you today.  I am glad you are feeling better. We will send in a prescription for a Proventil Inhaler. We will send in a prescription for the [facility]. 2 puffs twice daily Rinse your mouth with water after use. Continue your Nexium Follow up with Dr. Melvyn Novas in 6 months. You will need a follow up CT scan to re-evaluate the pulmonary nodule seen on CT this admission. Please contact office for sooner follow up if symptoms do not improve or worsen or seek emergency care

## 2015-08-31 NOTE — Telephone Encounter (Signed)
My last rec Plan A = Automatic = continue to suppress acid by nexium Take 30-60 min before first meal of the day and Pepcid ac 20 mg at bedtime  Plan B= Back Only use your albuterol as a rescue medication to be used if you can't catch your breath by resting or doing a relaxed purse lip breathing pattern.  - The less you use it, the better it will work when you need it. - Ok to use up to 2 puffs every 4 hours if you must but call for immediate appointment if use goes up over your usual need - Don't leave home without it !! (think of it like the spare tire for your car)   Plan C = crisis  Only use the nebulizer if you try plan B first and it doesn't work   If nebulizer not keeping her comfortable for 4 hours and she's already following plans A and B then rec Prednisone 10 mg take  4 each am x 2 days,   2 each am x 2 days,  1 each am x 2 days and stop   If not comfortable even p neb then needs to go to ER

## 2015-09-01 MED ORDER — PREDNISONE 10 MG PO TABS: ORAL_TABLET | ORAL | Status: DC

## 2015-09-01 NOTE — Telephone Encounter (Signed)
Patient called back, CB is [phone]

## 2015-09-01 NOTE — Telephone Encounter (Signed)
Spoke with pt and reviewed Dr Gustavus Bryant instructions.  Pt using Albuterol inh at least 2 x day and nebulizer at night and still having issues. Diff with sob on exertion at work due to a very large building.  Will send rx for Pred taper and advised pt if symptoms do not improve will need ov.  Pt voiced understanding.

## 2015-10-20 ENCOUNTER — Encounter: Payer: Self-pay | Admitting: Podiatry

## 2015-10-20 ENCOUNTER — Ambulatory Visit (INDEPENDENT_AMBULATORY_CARE_PROVIDER_SITE_OTHER): Payer: Medicare Other | Admitting: Podiatry

## 2015-10-20 VITALS — BP 124/88 | HR 69 | Resp 12

## 2015-10-20 DIAGNOSIS — B351 Tinea unguium: Secondary | ICD-10-CM | POA: Diagnosis not present

## 2015-10-20 DIAGNOSIS — E119 Type 2 diabetes mellitus without complications: Secondary | ICD-10-CM | POA: Diagnosis not present

## 2015-10-20 DIAGNOSIS — M79676 Pain in unspecified toe(s): Secondary | ICD-10-CM | POA: Diagnosis not present

## 2015-10-20 NOTE — Progress Notes (Signed)
   Subjective:    Patient ID: [name], female    DOB: [birth year], 65 y.o.   MRN: [id number]  HPI    This patient presents today concerned about a two-year history of gradually increase thickening and color changes and the toenails on the right and left feet. Patient describes occasional discomfort and difficulty trimming the toenails because of the deformity within the toenails. She denies any recent podiatric care or professional treatment. She is also a known diabetic and requesting a generalized diabetic foot screen.  She denies any history of foot ulceration, claudication, amputation. Patient works full-time in [employer] as a Freight forwarder   Review of Systems  Skin: Positive for color change.       Objective:   Physical Exam  Orientated 3  Vascular: DP pulses 2/4 bilaterally PT pulses 2/4 bilaterally Capillary reflex immediate bilaterally  Neurological: Sensation to 10 g monofilament wire intact 3/5 right 4/5 left Vibratory sensation reactive bilaterally Ankle reflexes reactive bilaterally  Dermatological: No open skin lesions bilaterally Surgical scar dorsal right fourth toe area The toenails elongated, brittle, discolored, hypertrophic 6-10  Musculoskeletal: Pes planus bilaterally HAV right Patient has stable gait No restriction ankle, subtalar, midtarsal joints bilaterally      Assessment & Plan:   Assessment: Diabetic with satisfactory neurovascular status Mycotic toenails 6-10  Plan: Today I discussed the results of exam with patient and treatment options for mycotic toenails in detail. We described oral medication permanent nail removal or repetitive debridement. Patient opts for repetitive debridement. The toenails 6-10 were debrided mechanically and electrically without any bleeding  Reappoint 3 months for nail debridement

## 2015-10-20 NOTE — Patient Instructions (Signed)
Diabetes and Foot Care Diabetes may cause you to have problems because of poor blood supply (circulation) to your feet and legs. This may cause the skin on your feet to become thinner, break easier, and heal more slowly. Your skin may become dry, and the skin may peel and crack. You may also have nerve damage in your legs and feet causing decreased feeling in them. You may not notice minor injuries to your feet that could lead to infections or more serious problems. Taking care of your feet is one of the most important things you can do for yourself.  HOME CARE INSTRUCTIONS  Wear shoes at all times, even in the house. Do not go barefoot. Bare feet are easily injured.  Check your feet daily for blisters, cuts, and redness. If you cannot see the bottom of your feet, use a mirror or ask someone for help.  Wash your feet with warm water (do not use hot water) and mild soap. Then pat your feet and the areas between your toes until they are completely dry. Do not soak your feet as this can dry your skin.  Apply a moisturizing lotion or petroleum jelly (that does not contain alcohol and is unscented) to the skin on your feet and to dry, brittle toenails. Do not apply lotion between your toes.  Trim your toenails straight across. Do not dig under them or around the cuticle. File the edges of your nails with an emery board or nail file.  Do not cut corns or calluses or try to remove them with medicine.  Wear clean socks or stockings every day. Make sure they are not too tight. Do not wear knee-high stockings since they may decrease blood flow to your legs.  Wear shoes that fit properly and have enough cushioning. To break in new shoes, wear them for just a few hours a day. This prevents you from injuring your feet. Always look in your shoes before you put them on to be sure there are no objects inside.  Do not cross your legs. This may decrease the blood flow to your feet.  If you find a minor scrape,  cut, or break in the skin on your feet, keep it and the skin around it clean and dry. These areas may be cleansed with mild soap and water. Do not cleanse the area with peroxide, alcohol, or iodine.  When you remove an adhesive bandage, be sure not to damage the skin around it.  If you have a wound, look at it several times a day to make sure it is healing.  Do not use heating pads or hot water bottles. They may burn your skin. If you have lost feeling in your feet or legs, you may not know it is happening until it is too late.  Make sure your health care provider performs a complete foot exam at least annually or more often if you have foot problems. Report any cuts, sores, or bruises to your health care provider immediately. SEEK MEDICAL CARE IF:   You have an injury that is not healing.  You have cuts or breaks in the skin.  You have an ingrown nail.  You notice redness on your legs or feet.  You feel burning or tingling in your legs or feet.  You have pain or cramps in your legs and feet.  Your legs or feet are numb.  Your feet always feel cold. SEEK IMMEDIATE MEDICAL CARE IF:   There is increasing redness,   swelling, or pain in or around a wound.  There is a red line that goes up your leg.  Pus is coming from a wound.  You develop a fever or as directed by your health care provider.  You notice a bad smell coming from an ulcer or wound.   This information is not intended to replace advice given to you by your health care provider. Make sure you discuss any questions you have with your health care provider.   Document Released: 04/14/2000 Document Revised: 12/18/2012 Document Reviewed: 09/24/2012 Elsevier Interactive Patient Education 2016 Elsevier Inc.  

## 2015-12-31 DIAGNOSIS — I214 Non-ST elevation (NSTEMI) myocardial infarction: Secondary | ICD-10-CM

## 2015-12-31 HISTORY — DX: Non-ST elevation (NSTEMI) myocardial infarction: I21.4

## 2016-01-16 ENCOUNTER — Inpatient Hospital Stay (HOSPITAL_COMMUNITY)
Admission: EM | Admit: 2016-01-16 | Discharge: 2016-01-21 | DRG: 246 | Disposition: A | Discharge status: Home / Self Care | Payer: Medicare Other | Attending: Internal Medicine | Admitting: Internal Medicine

## 2016-01-16 ENCOUNTER — Emergency Department (HOSPITAL_COMMUNITY): Payer: Medicare Other

## 2016-01-16 ENCOUNTER — Encounter (HOSPITAL_COMMUNITY): Payer: Self-pay

## 2016-01-16 DIAGNOSIS — Z7982 Long term (current) use of aspirin: Secondary | ICD-10-CM | POA: Diagnosis not present

## 2016-01-16 DIAGNOSIS — E785 Hyperlipidemia, unspecified: Secondary | ICD-10-CM | POA: Diagnosis present

## 2016-01-16 DIAGNOSIS — J9600 Acute respiratory failure, unspecified whether with hypoxia or hypercapnia: Secondary | ICD-10-CM | POA: Diagnosis present

## 2016-01-16 DIAGNOSIS — E1140 Type 2 diabetes mellitus with diabetic neuropathy, unspecified: Secondary | ICD-10-CM | POA: Diagnosis not present

## 2016-01-16 DIAGNOSIS — I10 Essential (primary) hypertension: Secondary | ICD-10-CM | POA: Diagnosis not present

## 2016-01-16 DIAGNOSIS — I119 Hypertensive heart disease without heart failure: Secondary | ICD-10-CM

## 2016-01-16 DIAGNOSIS — Z888 Allergy status to other drugs, medicaments and biological substances status: Secondary | ICD-10-CM

## 2016-01-16 DIAGNOSIS — E1165 Type 2 diabetes mellitus with hyperglycemia: Secondary | ICD-10-CM | POA: Diagnosis present

## 2016-01-16 DIAGNOSIS — IMO0002 Reserved for concepts with insufficient information to code with codable children: Secondary | ICD-10-CM

## 2016-01-16 DIAGNOSIS — J4531 Mild persistent asthma with (acute) exacerbation: Secondary | ICD-10-CM | POA: Diagnosis present

## 2016-01-16 DIAGNOSIS — I2510 Atherosclerotic heart disease of native coronary artery without angina pectoris: Secondary | ICD-10-CM | POA: Diagnosis present

## 2016-01-16 DIAGNOSIS — E1142 Type 2 diabetes mellitus with diabetic polyneuropathy: Secondary | ICD-10-CM | POA: Diagnosis present

## 2016-01-16 DIAGNOSIS — F329 Major depressive disorder, single episode, unspecified: Secondary | ICD-10-CM | POA: Diagnosis present

## 2016-01-16 DIAGNOSIS — E876 Hypokalemia: Secondary | ICD-10-CM | POA: Diagnosis present

## 2016-01-16 DIAGNOSIS — Z825 Family history of asthma and other chronic lower respiratory diseases: Secondary | ICD-10-CM

## 2016-01-16 DIAGNOSIS — Z7984 Long term (current) use of oral hypoglycemic drugs: Secondary | ICD-10-CM | POA: Diagnosis not present

## 2016-01-16 DIAGNOSIS — K219 Gastro-esophageal reflux disease without esophagitis: Secondary | ICD-10-CM | POA: Diagnosis present

## 2016-01-16 DIAGNOSIS — I214 Non-ST elevation (NSTEMI) myocardial infarction: Principal | ICD-10-CM

## 2016-01-16 DIAGNOSIS — E118 Type 2 diabetes mellitus with unspecified complications: Secondary | ICD-10-CM

## 2016-01-16 DIAGNOSIS — R0602 Shortness of breath: Secondary | ICD-10-CM

## 2016-01-16 DIAGNOSIS — J209 Acute bronchitis, unspecified: Secondary | ICD-10-CM | POA: Diagnosis present

## 2016-01-16 DIAGNOSIS — J45901 Unspecified asthma with (acute) exacerbation: Secondary | ICD-10-CM

## 2016-01-16 DIAGNOSIS — Z86718 Personal history of other venous thrombosis and embolism: Secondary | ICD-10-CM

## 2016-01-16 DIAGNOSIS — R0789 Other chest pain: Secondary | ICD-10-CM | POA: Diagnosis not present

## 2016-01-16 DIAGNOSIS — I2699 Other pulmonary embolism without acute cor pulmonale: Secondary | ICD-10-CM

## 2016-01-16 DIAGNOSIS — Z8249 Family history of ischemic heart disease and other diseases of the circulatory system: Secondary | ICD-10-CM

## 2016-01-16 DIAGNOSIS — R079 Chest pain, unspecified: Secondary | ICD-10-CM | POA: Diagnosis not present

## 2016-01-16 DIAGNOSIS — I5033 Acute on chronic diastolic (congestive) heart failure: Secondary | ICD-10-CM | POA: Diagnosis present

## 2016-01-16 DIAGNOSIS — I110 Hypertensive heart disease with heart failure: Secondary | ICD-10-CM | POA: Diagnosis present

## 2016-01-16 DIAGNOSIS — J9601 Acute respiratory failure with hypoxia: Secondary | ICD-10-CM | POA: Diagnosis not present

## 2016-01-16 DIAGNOSIS — Z86711 Personal history of pulmonary embolism: Secondary | ICD-10-CM

## 2016-01-16 DIAGNOSIS — Z87891 Personal history of nicotine dependence: Secondary | ICD-10-CM | POA: Diagnosis not present

## 2016-01-16 DIAGNOSIS — Z79899 Other long term (current) drug therapy: Secondary | ICD-10-CM | POA: Diagnosis not present

## 2016-01-16 HISTORY — DX: Anxiety disorder, unspecified: F41.9

## 2016-01-16 HISTORY — DX: Hypertensive heart disease without heart failure: I11.9

## 2016-01-16 HISTORY — DX: Depression, unspecified: F32.A

## 2016-01-16 HISTORY — DX: Atherosclerotic heart disease of native coronary artery without angina pectoris: I25.10

## 2016-01-16 HISTORY — DX: Major depressive disorder, single episode, unspecified: F32.9

## 2016-01-16 HISTORY — DX: Non-ST elevation (NSTEMI) myocardial infarction: I21.4

## 2016-01-16 HISTORY — DX: Chronic diastolic (congestive) heart failure: I50.32

## 2016-01-16 LAB — CBC WITH DIFFERENTIAL/PLATELET
BASOS ABS: 0 10*3/uL (ref 0.0–0.1)
Basophils Relative: 0 %
Eosinophils Absolute: 0 10*3/uL (ref 0.0–0.7)
Eosinophils Relative: 0 %
HEMATOCRIT: 39.5 % (ref 36.0–46.0)
HEMOGLOBIN: 13 g/dL (ref 12.0–15.0)
LYMPHS ABS: 1.2 10*3/uL (ref 0.7–4.0)
LYMPHS PCT: 10 %
MCH: 30.5 pg (ref 26.0–34.0)
MCHC: 32.9 g/dL (ref 30.0–36.0)
MCV: 92.7 fL (ref 78.0–100.0)
MONOS PCT: 4 %
Monocytes Absolute: 0.5 10*3/uL (ref 0.1–1.0)
Neutro Abs: 10 10*3/uL — ABNORMAL HIGH (ref 1.7–7.7)
Neutrophils Relative %: 86 %
Platelets: 353 10*3/uL (ref 150–400)
RBC: 4.26 MIL/uL (ref 3.87–5.11)
RDW: 13.5 % (ref 11.5–15.5)
WBC: 11.7 10*3/uL — AB (ref 4.0–10.5)

## 2016-01-16 LAB — TROPONIN I
TROPONIN I: 0.05 ng/mL — AB (ref <0.03)
TROPONIN I: 0.12 ng/mL — AB (ref <0.03)

## 2016-01-16 LAB — BASIC METABOLIC PANEL
ANION GAP: 11 (ref 5–15)
BUN: 20 mg/dL (ref 6–20)
CO2: 29 mmol/L (ref 22–32)
Calcium: 9.6 mg/dL (ref 8.9–10.3)
Chloride: 97 mmol/L — ABNORMAL LOW (ref 101–111)
Creatinine, Ser: 1.02 mg/dL — ABNORMAL HIGH (ref 0.44–1.00)
GFR calc Af Amer: >60 mL/min (ref >60)
GFR, EST NON AFRICAN AMERICAN: 56 mL/min — AB (ref >60)
Glucose, Bld: 232 mg/dL — ABNORMAL HIGH (ref 65–99)
POTASSIUM: 3 mmol/L — AB (ref 3.5–5.1)
SODIUM: 137 mmol/L (ref 135–145)

## 2016-01-16 LAB — I-STAT TROPONIN, ED: TROPONIN I, POC: 0 ng/mL (ref 0.00–0.08)

## 2016-01-16 LAB — D-DIMER, QUANTITATIVE: D-Dimer, Quant: <0.27 ug/mL-FEU (ref 0.00–0.50)

## 2016-01-16 LAB — GLUCOSE, CAPILLARY
Glucose-Capillary: 366 mg/dL — ABNORMAL HIGH (ref 65–99)
Glucose-Capillary: 389 mg/dL — ABNORMAL HIGH (ref 65–99)

## 2016-01-16 LAB — MAGNESIUM: Magnesium: 1.4 mg/dL — ABNORMAL LOW (ref 1.7–2.4)

## 2016-01-16 LAB — BRAIN NATRIURETIC PEPTIDE: B NATRIURETIC PEPTIDE 5: 35.5 pg/mL (ref 0.0–100.0)

## 2016-01-16 MED ORDER — IPRATROPIUM-ALBUTEROL 0.5-2.5 (3) MG/3ML IN SOLN
3.0000 mL | RESPIRATORY_TRACT | Status: DC | PRN
  Administered 2016-01-17 – 2016-01-21 (×4): 3 mL via RESPIRATORY_TRACT
  Filled 2016-01-16 (×5): qty 3

## 2016-01-16 MED ORDER — ENOXAPARIN SODIUM 40 MG/0.4ML ~~LOC~~ SOLN
40.0000 mg | SUBCUTANEOUS | Status: DC
  Administered 2016-01-16 – 2016-01-17 (×2): 40 mg via SUBCUTANEOUS
  Filled 2016-01-16 (×2): qty 0.4

## 2016-01-16 MED ORDER — LORAZEPAM 2 MG/ML IJ SOLN
1.0000 mg | Freq: Once | INTRAMUSCULAR | Status: AC
  Administered 2016-01-16: 1 mg via INTRAVENOUS
  Filled 2016-01-16: qty 1

## 2016-01-16 MED ORDER — ALBUTEROL SULFATE (2.5 MG/3ML) 0.083% IN NEBU
INHALATION_SOLUTION | RESPIRATORY_TRACT | Status: AC
  Administered 2016-01-16: 5 mg via RESPIRATORY_TRACT
  Filled 2016-01-16: qty 6

## 2016-01-16 MED ORDER — NITROGLYCERIN 0.4 MG SL SUBL
0.4000 mg | SUBLINGUAL_TABLET | SUBLINGUAL | Status: DC | PRN
  Administered 2016-01-16 – 2016-01-18 (×2): 0.4 mg via SUBLINGUAL
  Filled 2016-01-16 (×3): qty 1

## 2016-01-16 MED ORDER — METHYLPREDNISOLONE SODIUM SUCC 125 MG IJ SOLR
125.0000 mg | Freq: Once | INTRAMUSCULAR | Status: AC
  Administered 2016-01-16: 125 mg via INTRAVENOUS
  Filled 2016-01-16: qty 2

## 2016-01-16 MED ORDER — IPRATROPIUM-ALBUTEROL 0.5-2.5 (3) MG/3ML IN SOLN
3.0000 mL | Freq: Once | RESPIRATORY_TRACT | Status: AC
  Administered 2016-01-16: 3 mL via RESPIRATORY_TRACT
  Filled 2016-01-16: qty 3

## 2016-01-16 MED ORDER — ALBUTEROL SULFATE (2.5 MG/3ML) 0.083% IN NEBU
5.0000 mg | INHALATION_SOLUTION | Freq: Once | RESPIRATORY_TRACT | Status: AC
  Administered 2016-01-16: 5 mg via RESPIRATORY_TRACT
  Filled 2016-01-16: qty 6

## 2016-01-16 MED ORDER — ACETAMINOPHEN 325 MG PO TABS
650.0000 mg | ORAL_TABLET | Freq: Once | ORAL | Status: AC
  Administered 2016-01-16: 650 mg via ORAL
  Filled 2016-01-16: qty 2

## 2016-01-16 MED ORDER — ASPIRIN EC 81 MG PO TBEC
81.0000 mg | DELAYED_RELEASE_TABLET | Freq: Every morning | ORAL | Status: DC
  Administered 2016-01-16 – 2016-01-17 (×2): 81 mg via ORAL
  Filled 2016-01-16 (×2): qty 1

## 2016-01-16 MED ORDER — ALBUTEROL (5 MG/ML) CONTINUOUS INHALATION SOLN
10.0000 mg/h | INHALATION_SOLUTION | Freq: Once | RESPIRATORY_TRACT | Status: AC
  Administered 2016-01-16: 10 mg/h via RESPIRATORY_TRACT
  Filled 2016-01-16: qty 20

## 2016-01-16 MED ORDER — POTASSIUM CHLORIDE CRYS ER 20 MEQ PO TBCR
20.0000 meq | EXTENDED_RELEASE_TABLET | Freq: Every morning | ORAL | Status: DC
  Administered 2016-01-16: 20 meq via ORAL
  Filled 2016-01-16: qty 1

## 2016-01-16 MED ORDER — SODIUM CHLORIDE 0.9 % IV SOLN
INTRAVENOUS | Status: DC
  Administered 2016-01-16 – 2016-01-17 (×2): via INTRAVENOUS

## 2016-01-16 MED ORDER — ESTRADIOL 1 MG PO TABS
1.0000 mg | ORAL_TABLET | Freq: Every morning | ORAL | Status: DC
  Administered 2016-01-16 – 2016-01-21 (×6): 1 mg via ORAL
  Filled 2016-01-16 (×6): qty 1

## 2016-01-16 MED ORDER — HYDRALAZINE HCL 20 MG/ML IJ SOLN: 10.0000 mg | Freq: Once | INTRAMUSCULAR | Status: DC

## 2016-01-16 MED ORDER — POTASSIUM CHLORIDE CRYS ER 20 MEQ PO TBCR
40.0000 meq | EXTENDED_RELEASE_TABLET | Freq: Once | ORAL | Status: AC
  Administered 2016-01-16: 40 meq via ORAL
  Filled 2016-01-16: qty 2

## 2016-01-16 MED ORDER — HYDRALAZINE HCL 20 MG/ML IJ SOLN
5.0000 mg | Freq: Once | INTRAMUSCULAR | Status: AC
  Administered 2016-01-16: 5 mg via INTRAVENOUS
  Filled 2016-01-16: qty 1

## 2016-01-16 MED ORDER — INSULIN ASPART 100 UNIT/ML ~~LOC~~ SOLN
0.0000 [IU] | Freq: Every day | SUBCUTANEOUS | Status: DC
  Administered 2016-01-16: 5 [IU] via SUBCUTANEOUS
  Administered 2016-01-17: 3 [IU] via SUBCUTANEOUS
  Administered 2016-01-18 – 2016-01-19 (×2): 2 [IU] via SUBCUTANEOUS
  Administered 2016-01-20: 3 [IU] via SUBCUTANEOUS

## 2016-01-16 MED ORDER — HYDROCOD POLST-CPM POLST ER 10-8 MG/5ML PO SUER
5.0000 mL | Freq: Two times a day (BID) | ORAL | Status: DC | PRN
  Administered 2016-01-16 – 2016-01-21 (×8): 5 mL via ORAL
  Filled 2016-01-16 (×9): qty 5

## 2016-01-16 MED ORDER — IRBESARTAN 75 MG PO TABS
37.5000 mg | ORAL_TABLET | Freq: Every day | ORAL | Status: DC
  Administered 2016-01-16: 37.5 mg via ORAL
  Filled 2016-01-16 (×2): qty 0.5

## 2016-01-16 MED ORDER — LABETALOL HCL 5 MG/ML IV SOLN: 10.0000 mg | Freq: Once | INTRAVENOUS | Status: DC

## 2016-01-16 MED ORDER — ONDANSETRON HCL 4 MG/2ML IJ SOLN: 4.0000 mg | Freq: Four times a day (QID) | INTRAMUSCULAR | Status: DC | PRN

## 2016-01-16 MED ORDER — PANTOPRAZOLE SODIUM 40 MG PO TBEC
40.0000 mg | DELAYED_RELEASE_TABLET | Freq: Every day | ORAL | Status: DC
Start: 2016-01-16 — End: 2016-01-21
  Administered 2016-01-16 – 2016-01-21 (×6): 40 mg via ORAL
  Filled 2016-01-16 (×6): qty 1

## 2016-01-16 MED ORDER — ONDANSETRON HCL 4 MG PO TABS: 4.0000 mg | ORAL_TABLET | Freq: Four times a day (QID) | ORAL | Status: DC | PRN

## 2016-01-16 MED ORDER — ALPRAZOLAM 0.5 MG PO TABS
0.5000 mg | ORAL_TABLET | Freq: Two times a day (BID) | ORAL | Status: DC | PRN
  Administered 2016-01-16 – 2016-01-20 (×6): 0.5 mg via ORAL
  Filled 2016-01-16 (×6): qty 1

## 2016-01-16 MED ORDER — MORPHINE SULFATE (PF) 4 MG/ML IV SOLN
4.0000 mg | Freq: Once | INTRAVENOUS | Status: AC
  Administered 2016-01-16: 4 mg via INTRAVENOUS
  Filled 2016-01-16: qty 1

## 2016-01-16 MED ORDER — KETOROLAC TROMETHAMINE 15 MG/ML IJ SOLN
15.0000 mg | Freq: Once | INTRAMUSCULAR | Status: AC
  Administered 2016-01-16: 15 mg via INTRAVENOUS
  Filled 2016-01-16: qty 1

## 2016-01-16 MED ORDER — INSULIN ASPART 100 UNIT/ML ~~LOC~~ SOLN
0.0000 [IU] | Freq: Three times a day (TID) | SUBCUTANEOUS | Status: DC
  Administered 2016-01-16: 15 [IU] via SUBCUTANEOUS
  Administered 2016-01-17: 5 [IU] via SUBCUTANEOUS
  Administered 2016-01-17 (×2): 8 [IU] via SUBCUTANEOUS
  Administered 2016-01-18: 3 [IU] via SUBCUTANEOUS
  Administered 2016-01-18: 8 [IU] via SUBCUTANEOUS
  Administered 2016-01-18 – 2016-01-19 (×2): 3 [IU] via SUBCUTANEOUS
  Administered 2016-01-19: 18:00:00 8 [IU] via SUBCUTANEOUS
  Administered 2016-01-19: 06:00:00 5 [IU] via SUBCUTANEOUS
  Administered 2016-01-20 (×2): 3 [IU] via SUBCUTANEOUS
  Administered 2016-01-20: 17:00:00 8 [IU] via SUBCUTANEOUS
  Administered 2016-01-21: 5 [IU] via SUBCUTANEOUS

## 2016-01-16 MED ORDER — ACETAMINOPHEN 325 MG PO TABS
650.0000 mg | ORAL_TABLET | Freq: Four times a day (QID) | ORAL | Status: DC | PRN
  Administered 2016-01-17 – 2016-01-18 (×2): 650 mg via ORAL
  Filled 2016-01-16 (×3): qty 2

## 2016-01-16 MED ORDER — SODIUM CHLORIDE 0.9% FLUSH
3.0000 mL | Freq: Two times a day (BID) | INTRAVENOUS | Status: DC
  Administered 2016-01-18 – 2016-01-21 (×5): 3 mL via INTRAVENOUS

## 2016-01-16 MED ORDER — SODIUM CHLORIDE 0.9 % IV SOLN
Freq: Once | INTRAVENOUS | Status: AC
  Administered 2016-01-16: 11:00:00 via INTRAVENOUS

## 2016-01-16 MED ORDER — METHYLPREDNISOLONE SODIUM SUCC 125 MG IJ SOLR
60.0000 mg | Freq: Four times a day (QID) | INTRAMUSCULAR | Status: DC
  Administered 2016-01-16 – 2016-01-18 (×8): 60 mg via INTRAVENOUS
  Filled 2016-01-16 (×8): qty 2

## 2016-01-16 MED ORDER — MAGNESIUM SULFATE IN D5W 1-5 GM/100ML-% IV SOLN
1.0000 g | Freq: Once | INTRAVENOUS | Status: AC
  Administered 2016-01-16: 1 g via INTRAVENOUS
  Filled 2016-01-16: qty 100

## 2016-01-16 MED ORDER — ATORVASTATIN CALCIUM 40 MG PO TABS
40.0000 mg | ORAL_TABLET | Freq: Every morning | ORAL | Status: DC
  Administered 2016-01-16 – 2016-01-20 (×5): 40 mg via ORAL
  Filled 2016-01-16 (×6): qty 1

## 2016-01-16 MED ORDER — ACETAMINOPHEN 650 MG RE SUPP
650.0000 mg | Freq: Four times a day (QID) | RECTAL | Status: DC | PRN
Start: 2016-01-16 — End: 2016-01-21
  Administered 2016-01-18: 650 mg via RECTAL
  Filled 2016-01-16: qty 1

## 2016-01-16 MED ORDER — IPRATROPIUM-ALBUTEROL 0.5-2.5 (3) MG/3ML IN SOLN
3.0000 mL | Freq: Four times a day (QID) | RESPIRATORY_TRACT | Status: DC
  Administered 2016-01-16 – 2016-01-17 (×3): 3 mL via RESPIRATORY_TRACT
  Filled 2016-01-16 (×3): qty 3

## 2016-01-16 NOTE — ED Provider Notes (Signed)
[facility] DEPT Provider Note   CSN: [id number] Arrival date & time: 01/16/16  0913     History   Chief Complaint Chief Complaint  Patient presents with  . Asthma    HPI [name] is a 65 y.o. female.  HPI    [name] is a 65 y.o. female, with a history of asthma, HTN, and DM, presenting to the ED with shortness of breath, worsening for the past 4 days. Patient states she feels this is an exacerbation of her asthma caused by a respiratory virus that was being spread throughout her family. Patient has tried multiple uses of her albuterol inhaler as well as her home nebulizer with minimal relief. Endorses a productive cough with yellow sputum and some mild chest pain. Chest pain began after the cough and shortness of breath. Denies known fever, nausea/vomiting, abdominal pain, or any other complaints.    Patient endorses a history of both PE and DVT "years ago," she has been intubated for her asthma in the past, and she has had multiple admissions to the ICU for her asthma.     Past Medical History:  Diagnosis Date  . Asthma   . Bronchitis   . Diabetes mellitus   . Dyslipidemia   . GERD (gastroesophageal reflux disease)   . Hypertension   . Pneumonia     Patient Active Problem List   Diagnosis Date Noted  . Acute bronchitis with asthma with acute exacerbation 01/16/2016  . Acute hypokalemia 01/16/2016  . Hypomagnesemia 01/16/2016  . Acute respiratory failure ([place]) 01/16/2016  . Dyspnea   . Chest pain   . Asthma exacerbation 07/21/2015  . Upper airway cough syndrome vs Asthma  08/28/2014  . Diabetes mellitus type 2, controlled ([place]) 08/06/2014  . Hyperlipidemia 08/06/2014  . ALLERGIC RHINITIS 07/22/2008  . 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 DYSPNEA 07/22/2008  . Diabetes mellitus type 2, uncontrolled ([place]) 07/21/2008  . DIABETIC PERIPHERAL NEUROPATHY 07/21/2008  . DEPRESSION 07/21/2008  . Essential hypertension 07/21/2008  . Mild  persistent chronic asthma without complication [id number]  . G E R D 07/21/2008  . PULMONARY EMBOLISM, HX OF 07/21/2008    Past Surgical History:  Procedure Laterality Date  . ABDOMINAL HYSTERECTOMY    . COLONOSCOPY WITH PROPOFOL N/A 04/09/2014   Procedure: COLONOSCOPY WITH PROPOFOL;  Surgeon: Cleotis Nipper, MD;  Location: [facility];  Service: Endoscopy;  Laterality: N/A;  . ESOPHAGOGASTRODUODENOSCOPY (EGD) WITH PROPOFOL N/A 04/09/2014   Procedure: ESOPHAGOGASTRODUODENOSCOPY (EGD) WITH PROPOFOL;  Surgeon: Cleotis Nipper, MD;  Location: [facility];  Service: Endoscopy;  Laterality: N/A;  . HEMORRHOID SURGERY    . HERNIA REPAIR    . KNEE ARTHROSCOPY      OB History    No data available       Home Medications    Prior to Admission medications   Medication Sig Start Date End Date Taking? Authorizing Provider  albuterol (PROVENTIL HFA;VENTOLIN HFA) 108 (90 Base) MCG/ACT inhaler Inhale 2 puffs into the lungs every 6 (six) hours as needed for wheezing or shortness of breath. 08/12/15   Magdalen Spatz, NP  albuterol (PROVENTIL) (2.5 MG/3ML) 0.083% nebulizer solution Take 3 mLs (2.5 mg total) by nebulization every 4 (four) hours as needed for shortness of breath. 07/31/15   Theodis Blaze, MD  ALPRAZolam Duanne Moron) 0.5 MG tablet Take 1 tablet (0.5 mg total) by mouth 2 (two) times daily as needed for anxiety. 07/31/15   Theodis Blaze, MD  aspirin EC 81 MG tablet Take 81 mg by mouth every morning.     Historical Provider, MD  atorvastatin (LIPITOR) 40 MG tablet Take 40 mg by mouth every morning.     Historical Provider, MD  esomeprazole (NEXIUM) 40 MG capsule Take 40 mg by mouth 2 (two) times daily before a meal.     Historical Provider, MD  estradiol (ESTRACE) 1 MG tablet Take 1 mg by mouth every morning.     Historical Provider, MD  metFORMIN (GLUCOPHAGE) 500 MG tablet Take 500 mg by mouth daily with breakfast.    Historical Provider, MD  mometasone-formoterol (DULERA) 100-5 MCG/ACT AERO  Inhale 2 puffs into the lungs 2 (two) times daily. 08/12/15   Magdalen Spatz, NP  potassium chloride SA (K-DUR,KLOR-CON) 20 MEQ tablet Take 20 mEq by mouth every morning.     Historical Provider, MD  predniSONE (DELTASONE) 10 MG tablet Take 4 tabs/day x 2 days, 2 tabs/day x 2 days, 1 tab/day x 2 days. 09/01/15   Tanda Rockers, MD  valsartan (DIOVAN) 40 MG tablet Take 40 mg by mouth daily.    Historical Provider, MD    Family History Family History  Problem Relation Age of Onset  . Diabetes type II    . Hypertension    . Allergies Mother   . Asthma Mother     Social History Social History  Substance Use Topics  . Smoking status: Former Smoker    Packs/day: 1.00    Years: 30.00    Types: Cigarettes    Quit date: 01/09/2004  . Smokeless tobacco: Never Used  . Alcohol use No     Allergies   Amlodipine besylate; Lisinopril; and Pantoprazole sodium   Review of Systems Review of Systems  Constitutional: Negative for chills and fever.  HENT: Positive for congestion.   Respiratory: Positive for cough and shortness of breath.   Cardiovascular: Positive for chest pain.  Gastrointestinal: Negative for nausea and vomiting.  All other systems reviewed and are negative.    Physical Exam Updated Vital Signs BP (!) 203/94 (BP Location: Right Arm) Comment: has not had AM meds  Pulse 83   Temp 98.5 F (36.9 C) (Oral)   Resp 22   SpO2 100%   Physical Exam  Constitutional: She appears well-developed and well-nourished. No distress.  HENT:  Head: Normocephalic and atraumatic.  Eyes: Conjunctivae are normal.  Neck: Neck supple.  Cardiovascular: Normal rate, regular rhythm, normal heart sounds and intact distal pulses.   Pulmonary/Chest: Tachypnea noted. She has decreased breath sounds. She has wheezes. She has rhonchi. She exhibits tenderness.  Increased work of breathing. Patient speaks in short phrases.  Abdominal: Soft. There is no tenderness. There is no guarding.   Musculoskeletal: She exhibits no edema or tenderness.  Lymphadenopathy:    She has no cervical adenopathy.  Neurological: She is alert.  Skin: Skin is warm and dry. She is not diaphoretic.  Psychiatric: She has a normal mood and affect. Her behavior is normal.  Nursing note and vitals reviewed.    ED Treatments / Results  Labs (all labs ordered are listed, but only abnormal results are displayed) Labs Reviewed  BASIC METABOLIC PANEL - Abnormal; Notable for the following:       Result Value   Potassium 3.0 (*)    Chloride 97 (*)    Glucose, Bld 232 (*)    Creatinine, Ser 1.02 (*)    GFR calc non Af Amer 56 (*)  All other components within normal limits  CBC WITH DIFFERENTIAL/PLATELET - Abnormal; Notable for the following:    WBC 11.7 (*)    Neutro Abs 10.0 (*)    All other components within normal limits  MAGNESIUM - Abnormal; Notable for the following:    Magnesium 1.4 (*)    All other components within normal limits  RESPIRATORY PANEL BY PCR  BRAIN NATRIURETIC PEPTIDE  D-DIMER, QUANTITATIVE (NOT AT [facility])  HEMOGLOBIN A1C  I-STAT TROPOININ, ED    EKG  EKG Interpretation  Date/Time:  Sunday January 16 2016 10:14:13 EDT Ventricular Rate:  79 PR Interval:    QRS Duration: 86 QT Interval:  366 QTC Calculation: 420 R Axis:   32 Text Interpretation:  Sinus rhythm Borderline T abnormalities, diffuse leads T wave abnormalities appear similar to prior.  Confirmed by Sherry Ruffing MD, [place] Gave [phone]) on 01/16/2016 10:16:44 AM Also confirmed by Sherry Ruffing MD, [place] [phone]), editor [place], Joelene Millin [phone])  on 01/16/2016 11:44:05 AM       EKG Interpretation  Date/Time:  Sunday January 16 2016 10:14:13 EDT Ventricular Rate:  79 PR Interval:    QRS Duration: 86 QT Interval:  366 QTC Calculation: 420 R Axis:   32 Text Interpretation:  Sinus rhythm Borderline T abnormalities, diffuse leads T wave abnormalities appear similar to prior.  Confirmed by Sherry Ruffing MD,  [place] Gave [phone]) on 01/16/2016 10:16:44 AM Also confirmed by Sherry Ruffing MD, [place] [phone]), editor Yehuda Mao [phone])  on 01/16/2016 11:44:05 AM       Radiology Dg Chest Portable 1 View  Result Date: 01/16/2016 CLINICAL DATA:  Shortness of breath, wheezing, cough and central chest tightness for 4 days, history asthma, hypertension, diabetes mellitus, former smoker EXAM: PORTABLE CHEST 1 VIEW COMPARISON:  Portable exam 1052 hours compared to 07/24/2015 FINDINGS: Mild enlargement of cardiac silhouette. Mediastinal contours and pulmonary vascularity normal. Elongation of thoracic aorta again noted. Lungs clear. No pleural effusion or pneumothorax. Bones unremarkable. IMPRESSION: No acute abnormalities. Mild enlargement of cardiac silhouette. Electronically Signed   By: Lavonia Dana M.D.   On: 01/16/2016 11:14    Procedures Procedures (including critical care time)  Medications Ordered in ED Medications  morphine 4 MG/ML injection 4 mg (not administered)  insulin aspart (novoLOG) injection 0-15 Units (not administered)  insulin aspart (novoLOG) injection 0-5 Units (not administered)  ipratropium-albuterol (DUONEB) 0.5-2.5 (3) MG/3ML nebulizer solution 3 mL (not administered)  methylPREDNISolone sodium succinate (SOLU-MEDROL) 125 mg/2 mL injection 60 mg (not administered)  ipratropium-albuterol (DUONEB) 0.5-2.5 (3) MG/3ML nebulizer solution 3 mL (not administered)  albuterol (PROVENTIL) (2.5 MG/3ML) 0.083% nebulizer solution 5 mg (5 mg Nebulization Given 01/16/16 0925)  ipratropium-albuterol (DUONEB) 0.5-2.5 (3) MG/3ML nebulizer solution 3 mL (3 mLs Nebulization Given 01/16/16 1019)  methylPREDNISolone sodium succinate (SOLU-MEDROL) 125 mg/2 mL injection 125 mg (125 mg Intravenous Given 01/16/16 1019)  0.9 %  sodium chloride infusion ( Intravenous New Bag/Given 01/16/16 1047)  magnesium sulfate IVPB 1 g 100 mL (1 g Intravenous New Bag/Given 01/16/16 1124)  albuterol (PROVENTIL,VENTOLIN)  solution continuous neb (10 mg/hr Nebulization Given 01/16/16 1105)  potassium chloride SA (K-DUR,KLOR-CON) CR tablet 40 mEq (40 mEq Oral Given 01/16/16 1059)  hydrALAZINE (APRESOLINE) injection 5 mg (5 mg Intravenous Given 01/16/16 1059)     Initial Impression / Assessment and Plan / ED Course  I have reviewed the triage vital signs and the nursing notes.  Pertinent labs & imaging results that were available during my care of the patient were reviewed by me and considered in my medical  decision making (see chart for details).  Clinical Course   Patient with cough and increasing shortness of breath over the last 4 days.  Findings and plan of care discussed with Courtney Paris, MD. Dr. Sherry Ruffing personally evaluated and examined this patient.  Suspect asthma exacerbation plus or minus infectious component. PE must also be considered. Low suspicion for ACS. Wells criteria score is 1.5, indicating low risk for PE. Chest x-ray clear. D-dimer negative. Admission for continued asthma exacerbation.  12:27 PM Spoke with Erin Hearing, [facility], who agreed to admit the patient to observation telemetry under attending Dia Crawford.  1:56 PM RN reports patient is complaining worsening chest pain. Admitting team notified.   Vitals:   01/16/16 1047 01/16/16 1108 01/16/16 1115 01/16/16 1130  BP: 196/88  181/78 176/74  Pulse: 98  90 115  Resp: (!) 31  19 17   Temp:      TempSrc:      SpO2: 99% 99% 98% 100%   Vitals:   01/16/16 1215 01/16/16 1230 01/16/16 1300 01/16/16 1323  BP: 139/59 137/60 (!) 108/42 (!) 134/53  Pulse: 112 114 95   Resp: 21 20 19    Temp:      TempSrc:      SpO2: 100% 100% 97%      Final Clinical Impressions(s) / ED Diagnoses   Final diagnoses:  Shortness of breath    New Prescriptions New Prescriptions   No medications on file     Lorayne Bender, PA-C 01/16/16 [place], PA-C 01/16/16 [place], MD 01/16/16  2159

## 2016-01-16 NOTE — ED Notes (Signed)
Pt st's pain in chest has subsided at this time

## 2016-01-16 NOTE — ED Triage Notes (Signed)
Patient here with asthma exacerbation since Thursday, using inhaler and home nebs with minimal relief. Wheezing on arrival, speaking short phrases. Chest tightness related to her asthma with same

## 2016-01-16 NOTE — ED Notes (Addendum)
PT has an episode of coughing while sitting up in bed and slumps sideways, unconscious. PT's heartrate 120, pulse present. PT maintains O2 sats at 98% room air. PT's eyelids fluttering and she is grunting in response to stimulation. PT's eyes rolling. BP recycled and is 99/49. Dr. Sherry Ruffing comes to bedside. Episode lasts approximately 60 seconds. PT begins to acknowledge verbal stimuli and begins to interact with staff. No further nitro will be given

## 2016-01-16 NOTE — ED Notes (Signed)
Care handoff to Maudie Mercury , [place]

## 2016-01-16 NOTE — ED Notes (Signed)
This nurse enters room to reevaluate. PT is holding her chest and reports severe chest pain. PT states, "I've had chest pain this whole time." EDP made aware. Hospitalist made aware. PT will be held in ED for further testing.

## 2016-01-16 NOTE — Progress Notes (Addendum)
Moving air better now and lungs are clear anteriorly, remains slightly tachycardic, still having substernal chest pressure. Systolic blood pressure [id number]. Nurse to give initial nitroglycerin and can repeat up to total of 3 doses. If chest pain persists after nitroglycerin plan is to repeat EKG and if still experiencing ST segment changes will likely need to consult cardiology-morphine has not helped pain so will try IV Toradol in the event there is a MS component or if due to pericarditis-anterior chest wall is tender with palpation  RN also reported pt with coughing "spell" then had cough mediated syncope- has returned to baseline mentation. Toradol dose pending  Erin Hearing, ANP

## 2016-01-16 NOTE — ED Notes (Signed)
Resp made aware

## 2016-01-16 NOTE — Progress Notes (Addendum)
Chest pressure/chest pain has resolved. After Toradol patient's anterior chest wall tenderness that was reproducible palpation has also resolved. Now she is having left-sided headache likely from recent morphine and sublingual nitroglycerin. I have ordered Toradol prn. RN informed to obtain repeat EKG now that she is chest pain-free to determine if EKG has returned to baseline  1621: Repeat EKG patient still with tachycardia and similar ST segment changes as noted on previous documentation. Again she remains chest pain-free. Troponin did bump slightly to 0.05. I reviewed EKGs as well as clinical findings and lab data with Dr. Sherral Hammers who suspects that this juncture that patient's changes are related to demand ischemia in setting of acute asthma exacerbation which is now improving. Agrees with continuing to cycle troponin and check echocardiogram for completeness of evaluation. Recommendation is to consult cardiology if troponin continues to rise.  Erin Hearing, ANP

## 2016-01-16 NOTE — ED Notes (Signed)
Troponin called from lab.  Erin Hearing, NP made aware

## 2016-01-16 NOTE — H&P (Addendum)
History and Physical    [name] [id number] DOB: 05-13-1950 DOA: 01/16/2016   PCP: [name], MD   Patient coming from/Resides with: Private residence/lives with spouse  Admission status: Inpatient/telemetry --medically necessary to stay a minimum 2 midnights to rule out impending and/or unexpected changes in physiologic status that may differ from initial evaluation performed in the ER and/or at time of admission. Patient presents with persistent poor airway movement requiring IV steroids, frequent nebulizer treatments and monitoring per respiratory therapy and IV fluids. She apparently has a history of rapid decompensation in the past requiring intubation and ICU level of care. In addition patient has been instructed by her insurance company to make sure admitting staff admit patient as inpatient versus observation status, she has been told insurance will not pay for observation status.  Chief Complaint: Shortness of breath and failed outpatient treatment for acute asthma exacerbation and reflux.  HPI: [name] is a 65 y.o. female with medical history significant for hypertension, diabetes on metformin, history of prior PE and asthma. This past Thursday patient began having upper respiratory infection symptoms with cough and congestion with productive yellow sputum. Because of this she developed asthma exacerbation symptoms which are typical for her with increased wheezing and tightness. She is not had fevers or chills. She's not had any GI symptoms such as nausea, vomiting or diarrhea. She went to her PCP who prescribed prednisone. In addition she utilized her normal nebulizers and MDIs without improvement in her symptoms. Because of increased respiratory distress she presented to the ER today. No documentation from ER to determine if patient was hypoxemic at presentation. She was wheezing significantly upon arrival and becoming dyspneic with talking. She was also reporting  pleuritic chest tightness with taking a deep breath. She has been exposed to multiple sick contacts with similar symptoms prior to onset of her symptoms.  ED Course:  Vital Signs: BP 176/74   Pulse 115   Temp 98.5 F (36.9 C) (Oral)   Resp 17   SpO2 100%  DG portable chest: No acute abnormalities. Lab data: Sodium 137, potassium 3.0, chloride 97, BUN 20, creatinine 1.2, glucose 232, anion gap 11, magnesium 1.4, BNP 36, poc troponin 0.00, white count 11,700 with neutrophils 86% absolutely feels 10, hemoglobin 13, platelets 353,000, d-dimer <0.27 Medications and treatments: Albuterol neb 5 mg 1, DuoNeb 1, Solu-Medrol 125 mg IV 1, magnesium 1 g IV 1, Proventil neb continuous 10 mg/h 1, potassium tablet 40 mEq 1, hydralazine 5 mg IV 1  Review of Systems:  In addition to the HPI above,  No Fever-chills, myalgias or other constitutional symptoms No Headache, changes with Vision or hearing, new weakness, tingling, numbness in any extremity, No problems swallowing food or Liquids, indigestion/reflux No palpitations, orthopnea  No Abdominal pain, N/V; no melena or hematochezia, no dark tarry stools, Bowel movements are regular, No dysuria, hematuria or flank pain No new skin rashes, lesions, masses or bruises, No new joints pains-aches No recent weight gain or loss No polyuria, polydypsia or polyphagia,   Past Medical History:  Diagnosis Date  . Anxiety   . Asthma   . Bronchitis   . Chronic diastolic CHF (congestive heart failure) ([place])   . Coronary artery disease    a. NSTEMI 12/2015 - [facility] 01/18/16: s/p overlapping DESx2 to RCA, 10% ost-prox Cx,10% mLAD.  Marland Kitchen Depression   . Diabetes mellitus   . Dyslipidemia   . GERD (gastroesophageal reflux disease)   . Heart attack ([place])   .  Hypertension   . Hypertensive heart disease   . NSTEMI (non-ST elevated myocardial infarction) ([place]) 12/2015  . Pneumonia     Past Surgical History:  Procedure Laterality Date  . ABDOMINAL  HYSTERECTOMY    . CARDIAC CATHETERIZATION  1994   minimal LAD dz, no other dz, EF normal  . CARDIAC CATHETERIZATION N/A 01/18/2016   Procedure: Left Heart Cath and Coronary Angiography;  Surgeon: Burnell Blanks, MD;  Location: [facility];  Service: Cardiovascular;  Laterality: N/A;  . CARDIAC CATHETERIZATION N/A 01/18/2016   Procedure: Coronary Stent Intervention;  Surgeon: Burnell Blanks, MD;  Location: [facility];  Service: Cardiovascular;  Laterality: N/A;  . COLONOSCOPY WITH PROPOFOL N/A 04/09/2014   Procedure: COLONOSCOPY WITH PROPOFOL;  Surgeon: Cleotis Nipper, MD;  Location: [facility];  Service: Endoscopy;  Laterality: N/A;  . CORONARY STENT PLACEMENT  01/19/2016   STENT SYNERGY DES [id number] drug eluting stent was successfully placed, and overlaps the 2.5 x 38 mm Synergy stent placed distally.  . ESOPHAGOGASTRODUODENOSCOPY (EGD) WITH PROPOFOL N/A 04/09/2014   Procedure: ESOPHAGOGASTRODUODENOSCOPY (EGD) WITH PROPOFOL;  Surgeon: Cleotis Nipper, MD;  Location: [facility];  Service: Endoscopy;  Laterality: N/A;  . HEMORRHOID SURGERY    . HERNIA REPAIR    . KNEE ARTHROSCOPY      Social History   Social History  . Marital status: Married    Spouse name: N/A  . Number of children: 3  . Years of education: some college   Occupational History  . Merchandiser    Social History Main Topics  . Smoking status: Former Smoker    Packs/day: 1.00    Years: 30.00    Types: Cigarettes    Quit date: 01/09/2004  . Smokeless tobacco: Never Used  . Alcohol use No  . Drug use: No  . Sexual activity: Not on file   Other Topics Concern  . Not on file   Social History Narrative   Pt lives with husband in [place].   Right-handed.   2 cups caffeine per day.    Mobility: Without assistive devices Work history: Works in [employer]   Allergies  Allergen Reactions  . Amlodipine Besylate Other (See Comments)    Reaction unknown  .  Lisinopril Other (See Comments)    Reaction unknown  . Pantoprazole Sodium Other (See Comments)    Reaction unknown    Family History  Problem Relation Age of Onset  . Allergies Mother   . Asthma Mother   . CAD Father 20  . Diabetes Father   . Hypertension Father   . Congestive Heart Failure Sister     died in her 84s  . CAD Sister 86     Prior to Admission medications   Medication Sig Start Date End Date Taking? Authorizing Provider  albuterol (PROVENTIL HFA;VENTOLIN HFA) 108 (90 Base) MCG/ACT inhaler Inhale 2 puffs into the lungs every 6 (six) hours as needed for wheezing or shortness of breath. 08/12/15   Magdalen Spatz, NP  albuterol (PROVENTIL) (2.5 MG/3ML) 0.083% nebulizer solution Take 3 mLs (2.5 mg total) by nebulization every 4 (four) hours as needed for shortness of breath. 07/31/15   Theodis Blaze, MD  ALPRAZolam Duanne Moron) 0.5 MG tablet Take 1 tablet (0.5 mg total) by mouth 2 (two) times daily as needed for anxiety. 07/31/15   Theodis Blaze, MD  aspirin EC 81 MG tablet Take 81 mg by mouth every morning.     Historical Provider, MD  atorvastatin (LIPITOR) 40 MG tablet Take 40 mg by mouth every morning.     Historical Provider, MD  esomeprazole (NEXIUM) 40 MG capsule Take 40 mg by mouth 2 (two) times daily before a meal.     Historical Provider, MD  estradiol (ESTRACE) 1 MG tablet Take 1 mg by mouth every morning.     Historical Provider, MD  metFORMIN (GLUCOPHAGE) 500 MG tablet Take 500 mg by mouth daily with breakfast.    Historical Provider, MD  mometasone-formoterol (DULERA) 100-5 MCG/ACT AERO Inhale 2 puffs into the lungs 2 (two) times daily. 08/12/15   Magdalen Spatz, NP  potassium chloride SA (K-DUR,KLOR-CON) 20 MEQ tablet Take 20 mEq by mouth every morning.     Historical Provider, MD  predniSONE (DELTASONE) 10 MG tablet Take 4 tabs/day x 2 days, 2 tabs/day x 2 days, 1 tab/day x 2 days. 09/01/15   Tanda Rockers, MD  valsartan (DIOVAN) 40 MG tablet Take 40 mg by mouth daily.     Historical Provider, MD    Physical Exam: Vitals:   01/20/16 2025 01/21/16 0250 01/21/16 0536 01/21/16 0850  BP: (!) 163/75  (!) 184/78 (!) 169/76  Pulse: 88  75   Resp: 16  16   Temp: 98.4 F (36.9 C)  98.1 F (36.7 C)   TempSrc: Oral  Oral   SpO2: 98% 95%    Weight:   77.9 kg (171 lb 11.2 oz)   Height:          Constitutional: NAD, calm, comfortable Eyes: PERRL, lids and conjunctivae normal ENMT: Mucous membranes are moist. Posterior pharynx clear of any exudate or lesions.Normal dentition.  Neck: normal, supple, no masses, no thyromegaly Respiratory:Adequate air movement although patient is reporting a sensation of inadequate emptying of air with expiration, coarse bilateral wheezes on expiration, room air, becomes mildly tachypneic with speaking, no accessory muscle use, RA Cardiovascular: Regular rate and rhythm, no murmurs / rubs / gallops. No extremity edema. 2+ pedal pulses. No carotid bruits.  Abdomen: no tenderness, no masses palpated. No hepatosplenomegaly. Bowel sounds positive.  Musculoskeletal: no clubbing / cyanosis. No joint deformity upper and lower extremities. Good ROM, no contractures. Normal muscle tone.  Skin: no rashes, lesions, ulcers. No induration Neurologic: CN 2-12 grossly intact. Sensation intact, DTR normal. Strength 5/5 x all 4 extremities.  Psychiatric: Normal judgment and insight. Alert and oriented x 3. Normal mood.    Labs on Admission: I have personally reviewed following labs and imaging studies  CBC: No results for input(s): WBC, NEUTROABS, HGB, HCT, MCV, PLT in the last 168 hours. Basic Metabolic Panel:  Recent Labs Lab 01/26/16 1608  NA 130*  K 4.8  CL 94*  CO2 25  GLUCOSE 270*  BUN 22  CREATININE 1.06*  CALCIUM 9.6   GFR: Estimated Creatinine Clearance: 49.8 mL/min (by C-G formula based on SCr of 1.06 mg/dL (H)). Liver Function Tests: No results for input(s): AST, ALT, ALKPHOS, BILITOT, PROT, ALBUMIN in the last 168  hours. No results for input(s): LIPASE, AMYLASE in the last 168 hours. No results for input(s): AMMONIA in the last 168 hours. Coagulation Profile: No results for input(s): INR, PROTIME in the last 168 hours. Cardiac Enzymes: No results for input(s): CKTOTAL, CKMB, CKMBINDEX, TROPONINI in the last 168 hours. BNP (last 3 results) No results for input(s): PROBNP in the last 8760 hours. HbA1C: No results for input(s): HGBA1C in the last 72 hours. CBG: No results for input(s): GLUCAP in the last 168 hours.  Lipid Profile: No results for input(s): CHOL, HDL, LDLCALC, TRIG, CHOLHDL, LDLDIRECT in the last 72 hours. Thyroid Function Tests: No results for input(s): TSH, T4TOTAL, FREET4, T3FREE, THYROIDAB in the last 72 hours. Anemia Panel: No results for input(s): VITAMINB12, FOLATE, FERRITIN, TIBC, IRON, RETICCTPCT in the last 72 hours. Urine analysis:    Component Value Date/Time   COLORURINE YELLOW 08/07/2014 1304   APPEARANCEUR CLEAR 08/07/2014 1304   LABSPEC 1.012 08/07/2014 1304   PHURINE 5.0 08/07/2014 1304   GLUCOSEU NEGATIVE 08/07/2014 1304   HGBUR NEGATIVE 08/07/2014 1304   BILIRUBINUR NEGATIVE 08/07/2014 1304   KETONESUR NEGATIVE 08/07/2014 1304   PROTEINUR NEGATIVE 08/07/2014 1304   UROBILINOGEN 0.2 08/07/2014 1304   NITRITE NEGATIVE 08/07/2014 1304   LEUKOCYTESUR NEGATIVE 08/07/2014 1304   Sepsis Labs: @LABRCNTIP (procalcitonin:4,lacticidven:4) )No results found for this or any previous visit (from the past 240 hour(s)).   Radiological Exams on Admission: No results found.  EKG: (Independently reviewed) sinus rhythm with ventricular rate 79 bpm, QTC 420 ms, nonspecific ST segment downsloping in leads 3 and V3 through V5  Assessment/Plan Principal Problem:   Acute respiratory failure 2/2 Acute bronchitis with asthma with acute exacerbation -Patient presents with acute asthma exacerbation failed outpatient prednisone therapy that began after developing upper respiratory  infection symptoms likely viral in etiology -No focal infiltrate on chest x-ray and sputum production is mild with yellow sputum so no indication for antibiotics at this juncture; also no fever and current leukocytosis is likely secondary to the margination from admission prednisone -Respiratory viral panel -Scheduled duo nebs every 6 hours with prn dosing every 2 hours -Peak flow pre-and post nebulizer -IV Solu-Medrol 60 mg IV every 6 hours hopeful to transition to prednisone within 24 hours -IV fluid hydration for insensible fluid losses from acute respiratory distress -Has been tachycardic with nebulizer therapy therefore we'll continue telemetry monitoring  Active Problems:    Chest pain - 1400: CTSP re: development of chest pain different from initial presentation chest pain- now SSCP :pressure" "tightness" and "heaviness" non radiating plus SOB. EKG performed by staff in revealed new flattening as well as depression of the ST segments in lead 1,2 and aVL and V2 thru V5 -Noted increased chest pain with leaning forward -On pulmonary exam decreased air movement with tighter wheezing-?? CP 2/2 asthma sx's? -Has been given morphine 1 by ER physician -Nitroglycerin sublingual 1 -Ativan 1 mg IV 1 -Cycle troponin-consult cardiology if enzymes become positive -Echocardiogram -Differential includes viral pericarditis versus true ischemia -1415: Due to change in status with development of chest pain and EKG changes have changed bed status to stepdown    Diabetes mellitus type 2, uncontrolled  -Mildly uncontrolled in setting of recent steroids -During acute illness holding metformin -Moderate sliding scale insulin    PULMONARY EMBOLISM, HX OF -D-dimer normal    Essential hypertension -Mildly hypertensive at presentation so was given IV hydralazine improvement in blood pressure -Continue preadmission Diovan with therapeutic substitution    Acute hypokalemia -Likely secondary to  multiple neb treatments -Oral replacement given in ER -Appears to have a chronic component noting patient is on potassium daily at home -Follow labs    Hypomagnesemia -Magnesium was 1.4 -Was given 1 g IV in ER -Magnesium level in a.m.    G E R D - continue preadmission PPI    Hyperlipidemia -Continue preadmission Lipitor      DVT prophylaxis: Lovenox  Code Status: Full Family Communication: Husband at bedside  Disposition Plan: Anticipate discharge back to preadmission home  environment once medically stable Consults called: None    Erin Hearing ANP-BC [facility] Pager [phone]   If 7PM-7AM, please contact night-coverage [email or web address] Password [facility]  01/29/2016, 3:45 PM  Care during the described time interval was provided by ANP Erin Hearing and myself.  I have reviewed this patient's available data, including medical history, events of note, physical examination, and all test results as part of my evaluation. I have personally reviewed and interpreted all radiology studies.

## 2016-01-16 NOTE — ED Notes (Signed)
Danae Chen, RN on 5W accepts report at this time.

## 2016-01-17 ENCOUNTER — Other Ambulatory Visit (HOSPITAL_COMMUNITY): Payer: Self-pay

## 2016-01-17 ENCOUNTER — Ambulatory Visit (HOSPITAL_COMMUNITY): Payer: Self-pay

## 2016-01-17 ENCOUNTER — Encounter (HOSPITAL_COMMUNITY): Payer: Self-pay | Admitting: Physician Assistant

## 2016-01-17 DIAGNOSIS — I214 Non-ST elevation (NSTEMI) myocardial infarction: Principal | ICD-10-CM

## 2016-01-17 DIAGNOSIS — J45901 Unspecified asthma with (acute) exacerbation: Secondary | ICD-10-CM

## 2016-01-17 DIAGNOSIS — J209 Acute bronchitis, unspecified: Secondary | ICD-10-CM

## 2016-01-17 LAB — TROPONIN I: Troponin I: 0.29 ng/mL (ref <0.03)

## 2016-01-17 LAB — RESPIRATORY PANEL BY PCR
ADENOVIRUS-RVPPCR: NOT DETECTED
BORDETELLA PERTUSSIS-RVPCR: NOT DETECTED
CORONAVIRUS 229E-RVPPCR: NOT DETECTED
CORONAVIRUS HKU1-RVPPCR: NOT DETECTED
CORONAVIRUS NL63-RVPPCR: NOT DETECTED
Chlamydophila pneumoniae: NOT DETECTED
Coronavirus OC43: NOT DETECTED
Influenza A: NOT DETECTED
Influenza B: NOT DETECTED
METAPNEUMOVIRUS-RVPPCR: NOT DETECTED
Mycoplasma pneumoniae: NOT DETECTED
PARAINFLUENZA VIRUS 2-RVPPCR: NOT DETECTED
Parainfluenza Virus 1: NOT DETECTED
Parainfluenza Virus 3: NOT DETECTED
Parainfluenza Virus 4: NOT DETECTED
Respiratory Syncytial Virus: NOT DETECTED
Rhinovirus / Enterovirus: DETECTED — AB

## 2016-01-17 LAB — GLUCOSE, CAPILLARY
GLUCOSE-CAPILLARY: 227 mg/dL — AB (ref 65–99)
GLUCOSE-CAPILLARY: 272 mg/dL — AB (ref 65–99)
GLUCOSE-CAPILLARY: 271 mg/dL — AB (ref 65–99)
Glucose-Capillary: 236 mg/dL — ABNORMAL HIGH (ref 65–99)
Glucose-Capillary: 287 mg/dL — ABNORMAL HIGH (ref 65–99)
Glucose-Capillary: 314 mg/dL — ABNORMAL HIGH (ref 65–99)

## 2016-01-17 LAB — BASIC METABOLIC PANEL
Anion gap: 10 (ref 5–15)
BUN: 26 mg/dL — AB (ref 6–20)
CHLORIDE: 100 mmol/L — AB (ref 101–111)
CO2: 24 mmol/L (ref 22–32)
CREATININE: 1.21 mg/dL — AB (ref 0.44–1.00)
Calcium: 8.6 mg/dL — ABNORMAL LOW (ref 8.9–10.3)
GFR, EST AFRICAN AMERICAN: 52 mL/min — AB (ref >60)
GFR, EST NON AFRICAN AMERICAN: 45 mL/min — AB (ref >60)
Glucose, Bld: 276 mg/dL — ABNORMAL HIGH (ref 65–99)
POTASSIUM: 3.1 mmol/L — AB (ref 3.5–5.1)
SODIUM: 134 mmol/L — AB (ref 135–145)

## 2016-01-17 LAB — HEMOGLOBIN A1C
HEMOGLOBIN A1C: 7 % — AB (ref 4.8–5.6)
MEAN PLASMA GLUCOSE: 154 mg/dL

## 2016-01-17 LAB — CBC
HCT: 34.3 % — ABNORMAL LOW (ref 36.0–46.0)
HEMOGLOBIN: 11.3 g/dL — AB (ref 12.0–15.0)
MCH: 30.6 pg (ref 26.0–34.0)
MCHC: 32.9 g/dL (ref 30.0–36.0)
MCV: 93 fL (ref 78.0–100.0)
PLATELETS: 277 10*3/uL (ref 150–400)
RBC: 3.69 MIL/uL — AB (ref 3.87–5.11)
RDW: 13.9 % (ref 11.5–15.5)
WBC: 12.6 10*3/uL — ABNORMAL HIGH (ref 4.0–10.5)

## 2016-01-17 LAB — MAGNESIUM: MAGNESIUM: 1.9 mg/dL (ref 1.7–2.4)

## 2016-01-17 MED ORDER — INSULIN GLARGINE 100 UNIT/ML ~~LOC~~ SOLN
20.0000 [IU] | Freq: Every day | SUBCUTANEOUS | Status: DC
  Administered 2016-01-17: 20 [IU] via SUBCUTANEOUS
  Administered 2016-01-18: 10 [IU] via SUBCUTANEOUS
  Administered 2016-01-19: 09:00:00 20 [IU] via SUBCUTANEOUS
  Filled 2016-01-17 (×4): qty 0.2

## 2016-01-17 MED ORDER — MONTELUKAST SODIUM 10 MG PO TABS
10.0000 mg | ORAL_TABLET | Freq: Every day | ORAL | Status: DC
  Administered 2016-01-17 – 2016-01-20 (×4): 10 mg via ORAL
  Filled 2016-01-17 (×7): qty 1

## 2016-01-17 MED ORDER — POTASSIUM CHLORIDE CRYS ER 20 MEQ PO TBCR
40.0000 meq | EXTENDED_RELEASE_TABLET | Freq: Four times a day (QID) | ORAL | Status: AC
  Administered 2016-01-17 (×2): 40 meq via ORAL
  Filled 2016-01-17 (×2): qty 2

## 2016-01-17 MED ORDER — ALBUTEROL SULFATE (2.5 MG/3ML) 0.083% IN NEBU
2.5000 mg | INHALATION_SOLUTION | Freq: Three times a day (TID) | RESPIRATORY_TRACT | Status: DC
  Administered 2016-01-17 – 2016-01-21 (×11): 2.5 mg via RESPIRATORY_TRACT
  Filled 2016-01-17 (×11): qty 3

## 2016-01-17 MED ORDER — HYDRALAZINE HCL 50 MG PO TABS
50.0000 mg | ORAL_TABLET | Freq: Three times a day (TID) | ORAL | Status: DC
  Administered 2016-01-17 – 2016-01-20 (×8): 50 mg via ORAL
  Filled 2016-01-17 (×8): qty 1

## 2016-01-17 MED ORDER — SODIUM CHLORIDE 0.9 % IV SOLN: INTRAVENOUS | Status: AC

## 2016-01-17 MED ORDER — MOMETASONE FURO-FORMOTEROL FUM 200-5 MCG/ACT IN AERO
2.0000 | INHALATION_SPRAY | Freq: Two times a day (BID) | RESPIRATORY_TRACT | Status: DC
  Administered 2016-01-17 – 2016-01-20 (×6): 2 via RESPIRATORY_TRACT
  Filled 2016-01-17 (×2): qty 8.8

## 2016-01-17 MED ORDER — ALBUTEROL SULFATE (2.5 MG/3ML) 0.083% IN NEBU
2.5000 mg | INHALATION_SOLUTION | Freq: Four times a day (QID) | RESPIRATORY_TRACT | Status: DC
  Administered 2016-01-17: 2.5 mg via RESPIRATORY_TRACT
  Filled 2016-01-17: qty 3

## 2016-01-17 MED ORDER — AZITHROMYCIN 500 MG PO TABS
500.0000 mg | ORAL_TABLET | Freq: Every day | ORAL | Status: DC
  Administered 2016-01-17 – 2016-01-20 (×4): 500 mg via ORAL
  Filled 2016-01-17 (×4): qty 1

## 2016-01-17 NOTE — Consult Note (Signed)
CARDIOLOGY CONSULT NOTE   Patient ID: [name] MRN: [id number] DOB/AGE: 1950/05/21 65 y.o.  Admit date: 01/16/2016  Primary Physician   [name], MD Primary Cardiologist   New Reason for Consultation   Chest pain Requesting MD: Dr Candiss Norse  [id number] [name] is a 65 y.o. year old female with a history of Asthma, bronchitis, DM, HLD, HTN, GERD, PNA. History of atypical chest pain s/p catheterization 11/1992, w/ LAD irregular o/w no dz, nuc stress 05/1997 nl w/ EF 54%, echo 06/2015 w/ EF 60-65%, no WMA, grade1 dd (done for CP/SOB, trop peak 0.13 in setting of asthma exacerbation).  Pt had some CP, cards asked to see.   Pt had been sick for 3-4 days iwht her asthma. She was using her home meds and nebs, but without much improvement. Pt was wakened by SOB yesterday at 4 am. She was also having chest pain, worse with cough or hard breathing.  She did a nebulizer and felt better. When the SOB woke her again, she came to the ER. The chest pain will reach a "13/10" when she coughs, but is only a 3/10 otherwise. Her chest wall was tender at first, but that has improved. Tussionex helped.  She has had no fevers and the cough is non-productive. She thought the pain was from her reflux, but did not take anything for it before she came in.   She is normally able to walk and be active around the house without chest pain or SOB. Her father and 2 sisters have/had CAD, diagnosed in their 50s/60s.   Past Medical History:  Diagnosis Date  . Asthma   . Bronchitis   . Diabetes mellitus   . Dyslipidemia   . GERD (gastroesophageal reflux disease)   . Hypertension   . Pneumonia      Past Surgical History:  Procedure Laterality Date  . ABDOMINAL HYSTERECTOMY    . CARDIAC CATHETERIZATION  1994   minimal LAD dz, no other dz, EF normal  . COLONOSCOPY WITH PROPOFOL N/A 04/09/2014   Procedure: COLONOSCOPY WITH PROPOFOL;  Surgeon: Cleotis Nipper, MD;  Location: [facility];  Service:  Endoscopy;  Laterality: N/A;  . ESOPHAGOGASTRODUODENOSCOPY (EGD) WITH PROPOFOL N/A 04/09/2014   Procedure: ESOPHAGOGASTRODUODENOSCOPY (EGD) WITH PROPOFOL;  Surgeon: Cleotis Nipper, MD;  Location: [facility];  Service: Endoscopy;  Laterality: N/A;  . HEMORRHOID SURGERY    . HERNIA REPAIR    . KNEE ARTHROSCOPY      Allergies  Allergen Reactions  . Amlodipine Besylate Other (See Comments)    Reaction unknown  . Lisinopril Other (See Comments)    Reaction unknown  . Pantoprazole Sodium Other (See Comments)    Reaction unknown    I have reviewed the patient's current medications . albuterol  2.5 mg Nebulization TID  . aspirin EC  81 mg Oral q morning - 10a  . atorvastatin  40 mg Oral q morning - 10a  . azithromycin  500 mg Oral Daily  . enoxaparin (LOVENOX) injection  40 mg Subcutaneous Q24H  . estradiol  1 mg Oral q morning - 10a  . hydrALAZINE  50 mg Oral Q8H  . insulin aspart  0-15 Units Subcutaneous TID WC  . insulin aspart  0-5 Units Subcutaneous QHS  . insulin glargine  20 Units Subcutaneous Daily  . methylPREDNISolone (SOLU-MEDROL) injection  60 mg Intravenous Q6H  . mometasone-formoterol  2 puff Inhalation BID  . montelukast  10 mg Oral QHS  .  pantoprazole  40 mg Oral Daily  . sodium chloride flush  3 mL Intravenous Q12H   . sodium chloride 50 mL/hr at 01/17/16 0830   acetaminophen **OR** acetaminophen, ALPRAZolam, chlorpheniramine-HYDROcodone, ipratropium-albuterol, nitroGLYCERIN, ondansetron **OR** ondansetron (ZOFRAN) IV  Prior to Admission medications   Medication Sig Start Date End Date Taking? Authorizing Provider  albuterol (PROVENTIL HFA;VENTOLIN HFA) 108 (90 Base) MCG/ACT inhaler Inhale 2 puffs into the lungs every 6 (six) hours as needed for wheezing or shortness of breath. 08/12/15  Yes Magdalen Spatz, NP  albuterol (PROVENTIL) (2.5 MG/3ML) 0.083% nebulizer solution Take 3 mLs (2.5 mg total) by nebulization every 4 (four) hours as needed for shortness of  breath. 07/31/15  Yes Theodis Blaze, MD  ALPRAZolam Duanne Moron) 0.5 MG tablet Take 1 tablet (0.5 mg total) by mouth 2 (two) times daily as needed for anxiety. 07/31/15  Yes Theodis Blaze, MD  aspirin EC 81 MG tablet Take 81 mg by mouth every morning.    Yes Historical Provider, MD  atorvastatin (LIPITOR) 40 MG tablet Take 40 mg by mouth every morning.    Yes Historical Provider, MD  calcium carbonate (TUMS EX) 750 MG chewable tablet Chew 2 tablets by mouth at bedtime as needed for heartburn.   Yes Historical Provider, MD  diltiazem (CARDIZEM SR) 60 MG 12 hr capsule Take 1 capsule by mouth daily. 01/12/16  Yes Historical Provider, MD  esomeprazole (NEXIUM) 40 MG capsule Take 40 mg by mouth every morning.    Yes Historical Provider, MD  estradiol (ESTRACE) 1 MG tablet Take 1 mg by mouth every morning.    Yes Historical Provider, MD  fluticasone (FLONASE) 50 MCG/ACT nasal spray Place 2 sprays into both nostrils 2 (two) times daily as needed for allergies or rhinitis.   Yes Historical Provider, MD  metFORMIN (GLUCOPHAGE) 500 MG tablet Take 500 mg by mouth daily with breakfast.   Yes Historical Provider, MD  potassium chloride SA (K-DUR,KLOR-CON) 20 MEQ tablet Take 20 mEq by mouth every morning.    Yes Historical Provider, MD  predniSONE (DELTASONE) 20 MG tablet Tapered Dose 01/13/16  Yes Historical Provider, MD  SYMBICORT 160-4.5 MCG/ACT inhaler Inhale 2 puffs into the lungs 2 (two) times daily. 11/01/15  Yes Historical Provider, MD  valsartan-hydrochlorothiazide (DIOVAN-HCT) 320-25 MG tablet Take 1 tablet by mouth daily. 12/29/15  Yes Historical Provider, MD     Social History   Social History  . Marital status: Married    Spouse name: N/A  . Number of children: N/A  . Years of education: N/A   Occupational History  . Merchandiser    Social History Main Topics  . Smoking status: Former Smoker    Packs/day: 1.00    Years: 30.00    Types: Cigarettes    Quit date: 01/09/2004  . Smokeless tobacco: Never  Used  . Alcohol use No  . Drug use: No  . Sexual activity: Not on file   Other Topics Concern  . Not on file   Social History Narrative   Pt lives with husband in [place].    Family Status  Relation Status  . Mother Deceased  . Father Deceased  . Sister Deceased  . Sister Alive   Family History  Problem Relation Age of Onset  . Allergies Mother   . Asthma Mother   . CAD Father 46  . Diabetes Father   . Hypertension Father   . Congestive Heart Failure Sister     died in her 102s  .  CAD Sister 84     ROS:  Full 14 point review of systems complete and found to be negative unless listed above.  Physical Exam: Blood pressure (!) 176/82, pulse 91, temperature 98 F (36.7 C), temperature source Oral, resp. rate 18, SpO2 98 %.  General: Well developed, well nourished, female in mild respiratory distress Head: Eyes PERRLA, No xanthomas.   Normocephalic and atraumatic, oropharynx without edema or exudate. Dentition: good Lungs: rales bases, wheeze noted, mostly upper airway. Decreased air exchange Heart: HRRR S1 S2, no rub/gallop, 2/6 murmur. pulses are 2+ all 4 extrem.   Neck: No carotid bruits. No lymphadenopathy.  JVD elevated, difficult to quantify 2nd resp variation Abdomen: Bowel sounds present, abdomen soft and non-tender without masses or hernias noted. Msk:  No spine or cva tenderness. No weakness, no joint deformities or effusions. Extremities: No clubbing or cyanosis. No edema.  Neuro: Alert and oriented X 3. No focal deficits noted. Psych:  Good affect, responds appropriately Skin: No rashes or lesions noted.  Labs:   Lab Results  Component Value Date   WBC 12.6 (H) 01/17/2016   HGB 11.3 (L) 01/17/2016   HCT 34.3 (L) 01/17/2016   MCV 93.0 01/17/2016   PLT 277 01/17/2016     Recent Labs Lab 01/17/16 0158  NA 134*  K 3.1*  CL 100*  CO2 24  BUN 26*  CREATININE 1.21*  CALCIUM 8.6*  GLUCOSE 276*   Magnesium  Date Value Ref Range Status    01/17/2016 1.9 1.7 - 2.4 mg/dL Final    Recent Labs  01/16/16 1505 01/16/16 1925 01/17/16 0158  TROPONINI 0.05* 0.12* 0.29*    Recent Labs  01/16/16 1024  TROPIPOC 0.00   B Natriuretic Peptide  Date/Time Value Ref Range Status  01/16/2016 10:10 AM 35.5 0.0 - 100.0 pg/mL Final  07/30/2015 01:28 PM 57.6 0.0 - 100.0 pg/mL Final    Lab Results  Component Value Date   DDIMER <0.27 01/16/2016   Echo: 07/26/2015 - Left ventricle: The cavity size was normal. Wall thickness was   increased in a pattern of mild LVH. Systolic function was normal.   The estimated ejection fraction was in the range of 60% to 65%.   Wall motion was normal; there were no regional wall motion   abnormalities. Doppler parameters are consistent with abnormal   left ventricular relaxation (grade 1 diastolic dysfunction). - Atrial septum: No defect or patent foramen ovale was identified.  ECG:  09/17 SR, ST Initial ECG at 10:14 am has inferior T wave inversions that are old ECG at 4:15 pm is tachycardic w/ HR 116 and shows ST depression in I, II, V4-6  Radiology:  Dg Chest Portable 1 View Result Date: 01/16/2016 CLINICAL DATA:  Shortness of breath, wheezing, cough and central chest tightness for 4 days, history asthma, hypertension, diabetes mellitus, former smoker EXAM: PORTABLE CHEST 1 VIEW COMPARISON:  Portable exam 1052 hours compared to 07/24/2015 FINDINGS: Mild enlargement of cardiac silhouette. Mediastinal contours and pulmonary vascularity normal. Elongation of thoracic aorta again noted. Lungs clear. No pleural effusion or pneumothorax. Bones unremarkable. IMPRESSION: No acute abnormalities. Mild enlargement of cardiac silhouette. Electronically Signed   By: Lavonia Dana M.D.   On: 01/16/2016 11:14    ASSESSMENT AND PLAN:   The patient was seen today by Dr Marlou Porch, the patient evaluated and the data reviewed.   1. NSTEMI: - She had coronary calcifications on CTA 06/2015 - CRFs are FH CAD, HTN,  HLD, DM, borderline obesity and  remote tobacco use - ECG has dynamic changes with diffuse ST depression and AVR elevation concerning for triple vessel disease yesterday, recheck today. - troponin has never been this high before - will add heparin  - on ASA, statin, no BB 2nd asthma, ARB in hold - cath indicated.The risks and benefits of a cardiac catheterization including, but not limited to, death, stroke, MI, kidney damage and bleeding were discussed with the patient who indicates understanding and agrees to proceed.  - will tentatively schedule for tomorrow, recheck BMET in am.   2. Hypokalemia - s/p 80 meq total today - recheck in am  Otherwise, per IM Principal Problem:   Acute bronchitis with asthma with acute exacerbation Active Problems:   Diabetes mellitus type 2, uncontrolled ([place])   Essential hypertension   G E R D   PULMONARY EMBOLISM, HX OF   Hyperlipidemia   Chest pain   Acute hypokalemia   Hypomagnesemia   Acute respiratory failure (HCC)   SignedRosaria Ferries, PA-C 01/17/2016 1:49 PM Beeper [id number]  Co-Sign MD  Personally seen and examined. Agree with above.  65 year old female with history of asthma, upper airway wheeze, stridorous with chest discomfort, concerning EKG changes dynamic with ST segment depression diffusely and AVR elevation with mildly elevated troponin concerning for non-ST elevation myocardial infarction possibly global ischemia/triple-vessel disease. Has a very strong family history of CAD.  - Plan for cardiac catheterization tomorrow.  - Coronary artery calcium location noted on CT scan.  - Continuing to treat asthma per primary team.  - Upper airway constriction noted during physical exam.  Candee Furbish, MD

## 2016-01-17 NOTE — Care Management Note (Signed)
Case Management Note  Patient Details  Name: [name] MRN: [id number] Date of Birth: 09-21-1950  Subjective/Objective:        Admitted with bronchitis with  Asthma exacerbation,  history of hypertension, diabetes on metformin, history of prior PE and asthma. Resides with husband. Independent with ADL's, no DME usage PTA.     PCP:  [name]    Action/Plan: Return to home when medically stable. CM to f/u with disposition needs.  Expected Discharge Date:                  Expected Discharge Plan:  Home/Self Care (Lives with husband)  In-House Referral:     Discharge planning Services  CM Consult  Post Acute Care Choice:    Choice offered to:     DME Arranged:    DME Agency:     HH Arranged:    HH Agency:     Status of Service:  In process, will continue to follow  If discussed at Long Length of Stay Meetings, dates discussed:    Additional Comments:  Sharin Mons, RN 01/17/2016, 11:06 AM

## 2016-01-17 NOTE — Progress Notes (Signed)
Inpatient Diabetes Program Recommendations  AACE/ADA: New Consensus Statement on Inpatient Glycemic Control (2015)  Target Ranges:  Prepandial:   less than 140 mg/dL      Peak postprandial:   less than 180 mg/dL (1-2 hours)      Critically ill patients:  140 - 180 mg/dL   Lab Results  Component Value Date   GLUCAP 236 (H) 01/17/2016   HGBA1C 6.4 (H) 07/22/2015   Review of Glycemic Control  Diabetes history: DM 2 Outpatient Diabetes medications: Metformin 500 mg Daily Current orders for Inpatient glycemic control: Novolog Moderate + HS scale  Inpatient Diabetes Program Recommendations:   Glucose in the 200-300's, patient receiving IV Solumedrol 60 mg Q6hrs. Please consider increasing Novolog Correction to Novolog Resistant while on steroids.  Thanks,  Tama Headings RN, MSN, [facility] Inpatient Diabetes Coordinator Team Pager [phone] (8a-5p)

## 2016-01-17 NOTE — Progress Notes (Addendum)
PROGRESS NOTE                                                                                                                                                                                                             Patient Demographics:    [name], is a 65 y.o. female, DOB - 01-27-1951, [id number]  Admit date - 01/16/2016   Admitting Physician Allie Bossier, MD  Outpatient Primary MD for the patient is [name], MD  LOS - 1  Chief Complaint  Patient presents with  . Asthma       Brief Narrative    [name] is a 65 y.o. female with medical history significant for hypertension, diabetes on metformin, history of prior PE and asthma. This past Thursday patient began having upper respiratory infection symptoms with cough and congestion with productive yellow sputum. Because of this she developed asthma exacerbation symptoms which are typical for her with increased wheezing and tightness. She is not had fevers or chills. She's not had any GI symptoms such as nausea, vomiting or diarrhea. She went to her PCP who prescribed prednisone. In addition she utilized her normal nebulizers and MDIs without improvement in her symptoms. Because of increased respiratory distress she presented to the ER today. No documentation from ER to determine if patient was hypoxemic at presentation. She was wheezing significantly upon arrival and becoming dyspneic with talking. She was also reporting pleuritic chest tightness with taking a deep breath. She has been exposed to multiple sick contacts with similar symptoms prior to onset of her symptoms.   Subjective:    [name] today has, No headache, No chest pain, No abdominal pain - No Nausea, No new weakness tingling or numbness, + Cough & SOB.     Assessment  & Plan :      1. Acute on chronic asthma exacerbation. She still wheezing, continue IV steroids, added Singulair,  added nebulizer treatments both scheduled and as needed, will add Dulera as well and monitor. Pending respiratory panel.  2. Hypertension. Scheduled hydralazine and monitor.  3. History of PE. Normal d-dimer.  4. Hypokalemia. Replaced.  5. GERD. On PPI.  6.NSTEMI - pain free, on Meds ASA- Statin, cards called.  7.DM2 - ISS and add Lantus for better control while on steroids.  CBG (last 3)   Recent  Labs  01/17/16 0002 01/17/16 0417 01/17/16 0753  GLUCAP 314* 227* 236*      Family Communication  :  none  Code Status :  Full  Diet : heart-Low carb  Disposition Plan  :  Home 1-2 days  Consults  :  Cards  Procedures  :    DVT Prophylaxis  :  Lovenox   Lab Results  Component Value Date   PLT 277 01/17/2016    Inpatient Medications  Scheduled Meds: . albuterol  2.5 mg Nebulization Q6H  . aspirin EC  81 mg Oral q morning - 10a  . atorvastatin  40 mg Oral q morning - 10a  . azithromycin  500 mg Oral Daily  . enoxaparin (LOVENOX) injection  40 mg Subcutaneous Q24H  . estradiol  1 mg Oral q morning - 10a  . insulin aspart  0-15 Units Subcutaneous TID WC  . insulin aspart  0-5 Units Subcutaneous QHS  . methylPREDNISolone (SOLU-MEDROL) injection  60 mg Intravenous Q6H  . montelukast  10 mg Oral QHS  . pantoprazole  40 mg Oral Daily  . potassium chloride  40 mEq Oral Q6H  . sodium chloride flush  3 mL Intravenous Q12H   Continuous Infusions: . sodium chloride 50 mL/hr at 01/17/16 0830   PRN Meds:.acetaminophen **OR** acetaminophen, ALPRAZolam, chlorpheniramine-HYDROcodone, ipratropium-albuterol, nitroGLYCERIN, ondansetron **OR** ondansetron (ZOFRAN) IV  Antibiotics  :    Anti-infectives    Start     Dose/Rate Route Frequency Ordered Stop   01/17/16 1000  azithromycin (ZITHROMAX) tablet 500 mg     500 mg Oral Daily 01/17/16 0822 01/22/16 0959         Objective:   Vitals:   01/17/16 0245 01/17/16 0600 01/17/16 0608 01/17/16 0907  BP:  (!) 162/71      Pulse:  91    Resp:  18    Temp:  98 F (36.7 C)    TempSrc:  Oral    SpO2: 96% (!) 75% 92% 98%    Wt Readings from Last 3 Encounters:  08/12/15 75.8 kg (167 lb)  07/21/15 74.3 kg (163 lb 12.8 oz)  02/23/15 70.3 kg (155 lb)     Intake/Output Summary (Last 24 hours) at 01/17/16 1116 Last data filed at 01/17/16 1111  Gross per 24 hour  Intake              560 ml  Output                0 ml  Net              560 ml     Physical Exam  Awake Alert, Oriented X 3, No new F.N deficits, Normal affect [place].AT,PERRAL Supple Neck,No JVD, No cervical lymphadenopathy appriciated.  Symmetrical Chest wall movement, Good air movement bilaterally, CTAB RRR,No Gallops,Rubs or new Murmurs, No Parasternal Heave +ve B.Sounds, Abd Soft, No tenderness, No organomegaly appriciated, No rebound - guarding or rigidity. No Cyanosis, Clubbing or edema, No new Rash or bruise      Data Review:    CBC  Recent Labs Lab 01/16/16 1010 01/17/16 0158  WBC 11.7* 12.6*  HGB 13.0 11.3*  HCT 39.5 34.3*  PLT 353 277  MCV 92.7 93.0  MCH 30.5 30.6  MCHC 32.9 32.9  RDW 13.5 13.9  LYMPHSABS 1.2  --   MONOABS 0.5  --   EOSABS 0.0  --   BASOSABS 0.0  --     Chemistries   Recent Labs Lab  01/16/16 1010 01/16/16 1051 01/17/16 0158  NA 137  --  134*  K 3.0*  --  3.1*  CL 97*  --  100*  CO2 29  --  24  GLUCOSE 232*  --  276*  BUN 20  --  26*  CREATININE 1.02*  --  1.21*  CALCIUM 9.6  --  8.6*  MG  --  1.4* 1.9   ------------------------------------------------------------------------------------------------------------------ No results for input(s): CHOL, HDL, LDLCALC, TRIG, CHOLHDL, LDLDIRECT in the last 72 hours.  Lab Results  Component Value Date   HGBA1C 7.0 (H) 01/16/2016   ------------------------------------------------------------------------------------------------------------------ No results for input(s): TSH, T4TOTAL, T3FREE, THYROIDAB in the last 72 hours.  Invalid  input(s): FREET3 ------------------------------------------------------------------------------------------------------------------ No results for input(s): VITAMINB12, FOLATE, FERRITIN, TIBC, IRON, RETICCTPCT in the last 72 hours.  Coagulation profile No results for input(s): INR, PROTIME in the last 168 hours.   Recent Labs  01/16/16 1051  DDIMER <0.27    Cardiac Enzymes  Recent Labs Lab 01/16/16 1505 01/16/16 1925 01/17/16 0158  TROPONINI 0.05* 0.12* 0.29*   ------------------------------------------------------------------------------------------------------------------    Component Value Date/Time   BNP 35.5 01/16/2016 1010    Micro Results No results found for this or any previous visit (from the past 240 hour(s)).  Radiology Reports Dg Chest Portable 1 View  Result Date: 01/16/2016 CLINICAL DATA:  Shortness of breath, wheezing, cough and central chest tightness for 4 days, history asthma, hypertension, diabetes mellitus, former smoker EXAM: PORTABLE CHEST 1 VIEW COMPARISON:  Portable exam 1052 hours compared to 07/24/2015 FINDINGS: Mild enlargement of cardiac silhouette. Mediastinal contours and pulmonary vascularity normal. Elongation of thoracic aorta again noted. Lungs clear. No pleural effusion or pneumothorax. Bones unremarkable. IMPRESSION: No acute abnormalities. Mild enlargement of cardiac silhouette. Electronically Signed   By: Lavonia Dana M.D.   On: 01/16/2016 11:14    Time Spent in minutes  30   Peter Keyworth K M.D on 01/17/2016 at 11:16 AM  Between 7am to 7pm - Pager - [phone]  After 7pm go to [email or web address] - password [facility]  [facility] -  Office  [phone]

## 2016-01-18 ENCOUNTER — Inpatient Hospital Stay (HOSPITAL_COMMUNITY): Payer: Medicare Other

## 2016-01-18 ENCOUNTER — Encounter (HOSPITAL_COMMUNITY)
Admission: EM | Disposition: A | Discharge status: Home / Self Care | Payer: Self-pay | Source: Home / Self Care | Attending: Internal Medicine

## 2016-01-18 DIAGNOSIS — I214 Non-ST elevation (NSTEMI) myocardial infarction: Secondary | ICD-10-CM

## 2016-01-18 DIAGNOSIS — I2510 Atherosclerotic heart disease of native coronary artery without angina pectoris: Secondary | ICD-10-CM

## 2016-01-18 DIAGNOSIS — R079 Chest pain, unspecified: Secondary | ICD-10-CM

## 2016-01-18 HISTORY — PX: CARDIAC CATHETERIZATION: SHX172

## 2016-01-18 LAB — BASIC METABOLIC PANEL
ANION GAP: 9 (ref 5–15)
Anion gap: 6 (ref 5–15)
BUN: 29 mg/dL — AB (ref 6–20)
BUN: 29 mg/dL — ABNORMAL HIGH (ref 6–20)
CHLORIDE: 105 mmol/L (ref 101–111)
CO2: 27 mmol/L (ref 22–32)
CO2: 24 mmol/L (ref 22–32)
CREATININE: 1.12 mg/dL — AB (ref 0.44–1.00)
Calcium: 8.9 mg/dL (ref 8.9–10.3)
Calcium: 9 mg/dL (ref 8.9–10.3)
Chloride: 104 mmol/L (ref 101–111)
Creatinine, Ser: 1.08 mg/dL — ABNORMAL HIGH (ref 0.44–1.00)
GFR calc Af Amer: 58 mL/min — ABNORMAL LOW (ref >60)
GFR calc non Af Amer: 50 mL/min — ABNORMAL LOW (ref >60)
GFR, EST NON AFRICAN AMERICAN: 52 mL/min — AB (ref >60)
GLUCOSE: 228 mg/dL — AB (ref 65–99)
Glucose, Bld: 234 mg/dL — ABNORMAL HIGH (ref 65–99)
POTASSIUM: 4.2 mmol/L (ref 3.5–5.1)
POTASSIUM: 4 mmol/L (ref 3.5–5.1)
Sodium: 138 mmol/L (ref 135–145)
Sodium: 137 mmol/L (ref 135–145)

## 2016-01-18 LAB — PROTIME-INR
INR: 1.03
Prothrombin Time: 13.5 seconds (ref 11.4–15.2)

## 2016-01-18 LAB — GLUCOSE, CAPILLARY
GLUCOSE-CAPILLARY: 265 mg/dL — AB (ref 65–99)
GLUCOSE-CAPILLARY: 169 mg/dL — AB (ref 65–99)
GLUCOSE-CAPILLARY: 163 mg/dL — AB (ref 65–99)
GLUCOSE-CAPILLARY: 242 mg/dL — AB (ref 65–99)

## 2016-01-18 LAB — POCT ACTIVATED CLOTTING TIME: ACTIVATED CLOTTING TIME: 252 s

## 2016-01-18 LAB — ECHOCARDIOGRAM COMPLETE

## 2016-01-18 SURGERY — LEFT HEART CATH AND CORONARY ANGIOGRAPHY

## 2016-01-18 MED ORDER — IOPAMIDOL (ISOVUE-370) INJECTION 76%
INTRAVENOUS | Status: DC | PRN
  Administered 2016-01-18: 125 mL via INTRA_ARTERIAL

## 2016-01-18 MED ORDER — ASPIRIN EC 81 MG PO TBEC
81.0000 mg | DELAYED_RELEASE_TABLET | Freq: Every morning | ORAL | Status: DC
  Administered 2016-01-19 – 2016-01-21 (×3): 81 mg via ORAL
  Filled 2016-01-18 (×3): qty 1

## 2016-01-18 MED ORDER — IOPAMIDOL (ISOVUE-370) INJECTION 76%
INTRAVENOUS | Status: AC
  Filled 2016-01-18: qty 100

## 2016-01-18 MED ORDER — HYDRALAZINE HCL 20 MG/ML IJ SOLN
INTRAMUSCULAR | Status: AC
  Filled 2016-01-18: qty 1

## 2016-01-18 MED ORDER — VERAPAMIL HCL 2.5 MG/ML IV SOLN
INTRAVENOUS | Status: DC | PRN
  Administered 2016-01-18: 10 mL via INTRA_ARTERIAL

## 2016-01-18 MED ORDER — VERAPAMIL HCL 2.5 MG/ML IV SOLN
INTRAVENOUS | Status: AC
  Filled 2016-01-18: qty 2

## 2016-01-18 MED ORDER — TICAGRELOR 90 MG PO TABS
ORAL_TABLET | ORAL | Status: AC
  Filled 2016-01-18: qty 1

## 2016-01-18 MED ORDER — HEPARIN SODIUM (PORCINE) 1000 UNIT/ML IJ SOLN
INTRAMUSCULAR | Status: AC
  Filled 2016-01-18: qty 1

## 2016-01-18 MED ORDER — ANGIOPLASTY BOOK
Freq: Once | Status: AC
  Administered 2016-01-18: 23:00:00
  Filled 2016-01-18: qty 1

## 2016-01-18 MED ORDER — NITROGLYCERIN 1 MG/10 ML FOR IR/CATH LAB
INTRA_ARTERIAL | Status: AC
  Filled 2016-01-18: qty 10

## 2016-01-18 MED ORDER — ASPIRIN 81 MG PO CHEW
324.0000 mg | CHEWABLE_TABLET | ORAL | Status: AC
  Administered 2016-01-18: 324 mg via ORAL
  Filled 2016-01-18: qty 4

## 2016-01-18 MED ORDER — SODIUM CHLORIDE 0.9 % IV SOLN: INTRAVENOUS | Status: DC

## 2016-01-18 MED ORDER — SODIUM CHLORIDE 0.9% FLUSH
3.0000 mL | Freq: Two times a day (BID) | INTRAVENOUS | Status: DC
  Administered 2016-01-18: 3 mL via INTRAVENOUS

## 2016-01-18 MED ORDER — MIDAZOLAM HCL 2 MG/2ML IJ SOLN
INTRAMUSCULAR | Status: AC
  Filled 2016-01-18: qty 2

## 2016-01-18 MED ORDER — HEART ATTACK BOUNCING BOOK
Freq: Once | Status: AC
  Administered 2016-01-18: 23:00:00
  Filled 2016-01-18: qty 1

## 2016-01-18 MED ORDER — HEPARIN (PORCINE) IN NACL 2-0.9 UNIT/ML-% IJ SOLN
INTRAMUSCULAR | Status: AC
  Filled 2016-01-18: qty 1000

## 2016-01-18 MED ORDER — SODIUM CHLORIDE 0.9% FLUSH: 3.0000 mL | INTRAVENOUS | Status: DC | PRN

## 2016-01-18 MED ORDER — TICAGRELOR 90 MG PO TABS
90.0000 mg | ORAL_TABLET | Freq: Two times a day (BID) | ORAL | Status: DC
  Administered 2016-01-19 – 2016-01-21 (×6): 90 mg via ORAL
  Filled 2016-01-18 (×6): qty 1

## 2016-01-18 MED ORDER — NITROGLYCERIN 1 MG/10 ML FOR IR/CATH LAB
INTRA_ARTERIAL | Status: DC | PRN
  Administered 2016-01-18: 200 ug

## 2016-01-18 MED ORDER — TICAGRELOR 90 MG PO TABS
ORAL_TABLET | ORAL | Status: DC | PRN
  Administered 2016-01-18: 180 mg via ORAL

## 2016-01-18 MED ORDER — LIDOCAINE HCL (PF) 1 % IJ SOLN
INTRAMUSCULAR | Status: AC
  Filled 2016-01-18: qty 30

## 2016-01-18 MED ORDER — HEPARIN (PORCINE) IN NACL 2-0.9 UNIT/ML-% IJ SOLN
INTRAMUSCULAR | Status: DC | PRN
  Administered 2016-01-18: 1000 mL

## 2016-01-18 MED ORDER — ASPIRIN 81 MG PO CHEW: 324.0000 mg | CHEWABLE_TABLET | ORAL | Status: DC

## 2016-01-18 MED ORDER — SODIUM CHLORIDE 0.9 % IV SOLN: 250.0000 mL | INTRAVENOUS | Status: DC | PRN

## 2016-01-18 MED ORDER — METHYLPREDNISOLONE SODIUM SUCC 125 MG IJ SOLR
60.0000 mg | Freq: Two times a day (BID) | INTRAMUSCULAR | Status: DC
  Administered 2016-01-18 – 2016-01-19 (×3): 60 mg via INTRAVENOUS
  Filled 2016-01-18 (×3): qty 2

## 2016-01-18 MED ORDER — FENTANYL CITRATE (PF) 100 MCG/2ML IJ SOLN
INTRAMUSCULAR | Status: DC | PRN
  Administered 2016-01-18: 25 ug via INTRAVENOUS
  Administered 2016-01-18: 50 ug via INTRAVENOUS
  Administered 2016-01-18: 25 ug via INTRAVENOUS

## 2016-01-18 MED ORDER — FENTANYL CITRATE (PF) 100 MCG/2ML IJ SOLN
INTRAMUSCULAR | Status: AC
  Filled 2016-01-18: qty 2

## 2016-01-18 MED ORDER — SODIUM CHLORIDE 0.9% FLUSH
3.0000 mL | Freq: Two times a day (BID) | INTRAVENOUS | Status: DC
  Administered 2016-01-19 – 2016-01-20 (×3): 3 mL via INTRAVENOUS

## 2016-01-18 MED ORDER — FAMOTIDINE 20 MG PO TABS
20.0000 mg | ORAL_TABLET | Freq: Two times a day (BID) | ORAL | Status: DC | PRN
  Administered 2016-01-18 – 2016-01-21 (×2): 20 mg via ORAL
  Filled 2016-01-18 (×2): qty 1

## 2016-01-18 MED ORDER — SODIUM CHLORIDE 0.9 % IV SOLN
INTRAVENOUS | Status: DC
  Administered 2016-01-18: 20:00:00 via INTRAVENOUS

## 2016-01-18 MED ORDER — HYDRALAZINE HCL 20 MG/ML IJ SOLN
INTRAMUSCULAR | Status: DC | PRN
  Administered 2016-01-18 (×2): 10 mg via INTRAVENOUS

## 2016-01-18 MED ORDER — LIDOCAINE HCL (PF) 1 % IJ SOLN
INTRAMUSCULAR | Status: DC | PRN
  Administered 2016-01-18: 2 mL

## 2016-01-18 MED ORDER — MIDAZOLAM HCL 2 MG/2ML IJ SOLN
INTRAMUSCULAR | Status: DC | PRN
  Administered 2016-01-18 (×2): 1 mg via INTRAVENOUS

## 2016-01-18 MED ORDER — HEPARIN SODIUM (PORCINE) 1000 UNIT/ML IJ SOLN
INTRAMUSCULAR | Status: DC | PRN
  Administered 2016-01-18: 4000 [IU] via INTRAVENOUS
  Administered 2016-01-18: 3000 [IU] via INTRAVENOUS
  Administered 2016-01-18: 6000 [IU] via INTRAVENOUS

## 2016-01-18 SURGICAL SUPPLY — 18 items
BALLN EMERGE MR 2.0X12 (BALLOONS) ×2
BALLN ~~LOC~~ EMERGE MR 2.75X30 (BALLOONS) ×2
BALLOON EMERGE MR 2.0X12 (BALLOONS) IMPLANT
BALLOON ~~LOC~~ EMERGE MR 2.75X30 (BALLOONS) IMPLANT
CATH IMPULSE 5F ANG/FL3.5 (CATHETERS) ×1 IMPLANT
CATH VISTA GUIDE 6FR JR4 (CATHETERS) ×1 IMPLANT
DEVICE RAD COMP TR BAND LRG (VASCULAR PRODUCTS) ×1 IMPLANT
GLIDESHEATH SLEND SS 6F .021 (SHEATH) ×1 IMPLANT
KIT HEART LEFT (KITS) ×2 IMPLANT
PACK CARDIAC CATHETERIZATION (CUSTOM PROCEDURE TRAY) ×2 IMPLANT
STENT SYNERGY DES 2.5X38 (Permanent Stent) ×1 IMPLANT
STENT SYNERGY DES 2.75X32 (Permanent Stent) ×1 IMPLANT
SYR MEDRAD MARK V 150ML (SYRINGE) ×2 IMPLANT
TRANSDUCER W/STOPCOCK (MISCELLANEOUS) ×2 IMPLANT
TUBING CIL FLEX 10 FLL-RA (TUBING) ×2 IMPLANT
WIRE HI TORQ BMW 190CM (WIRE) ×1 IMPLANT
WIRE HITORQ VERSACORE ST 145CM (WIRE) ×1 IMPLANT
WIRE SAFE-T 1.5MM-J .035X260CM (WIRE) ×1 IMPLANT

## 2016-01-18 NOTE — H&P (View-Only) (Signed)
Patient Name: [name] Date of Encounter: 01/18/2016  Primary Cardiologist: [facility] Problem List     Principal Problem:   Acute bronchitis with asthma with acute exacerbation Active Problems:   Diabetes mellitus type 2, uncontrolled (HCC)   Essential hypertension   G E R D   PULMONARY EMBOLISM, HX OF   Hyperlipidemia   Chest pain   Acute hypokalemia   Hypomagnesemia   Acute respiratory failure (HCC)     Subjective   Still with upper airway noise (stridor like). No CP  Inpatient Medications    Scheduled Meds: . albuterol  2.5 mg Nebulization TID  . aspirin  324 mg Oral Pre-Cath  . [START ON 01/19/2016] aspirin EC  81 mg Oral q morning - 10a  . atorvastatin  40 mg Oral q morning - 10a  . azithromycin  500 mg Oral Daily  . enoxaparin (LOVENOX) injection  40 mg Subcutaneous Q24H  . estradiol  1 mg Oral q morning - 10a  . hydrALAZINE  50 mg Oral Q8H  . insulin aspart  0-15 Units Subcutaneous TID WC  . insulin aspart  0-5 Units Subcutaneous QHS  . insulin glargine  20 Units Subcutaneous Daily  . methylPREDNISolone (SOLU-MEDROL) injection  60 mg Intravenous Q6H  . mometasone-formoterol  2 puff Inhalation BID  . montelukast  10 mg Oral QHS  . pantoprazole  40 mg Oral Daily  . sodium chloride flush  3 mL Intravenous Q12H  . sodium chloride flush  3 mL Intravenous Q12H   Continuous Infusions: . sodium chloride     PRN Meds:.sodium chloride, acetaminophen **OR** acetaminophen, ALPRAZolam, chlorpheniramine-HYDROcodone, famotidine, ipratropium-albuterol, nitroGLYCERIN, ondansetron **OR** ondansetron (ZOFRAN) IV, sodium chloride flush   Vital Signs    Vitals:   01/17/16 1512 01/17/16 2103 01/17/16 2145 01/18/16 0607  BP: (!) 162/74  (!) 184/81 (!) 165/68  Pulse: 92 88 96 75  Resp: 20 18 19 20   Temp: 98 F (36.7 C)  98.3 F (36.8 C) 97.7 F (36.5 C)  TempSrc: Oral  Oral Oral  SpO2:  98% 93% 98%    Intake/Output Summary (Last 24 hours) at 01/18/16  [id number] Last data filed at 01/17/16 1923  Gross per 24 hour  Intake          1831.23 ml  Output                0 ml  Net          1831.23 ml   There were no vitals filed for this visit.  Physical Exam    GEN: Well nourished, well developed, in no acute distress.  HEENT: Grossly normal.  Neck: Supple, no JVD, carotid bruits, or masses. Cardiac: RRR, no murmurs, rubs, or gallops. No clubbing, cyanosis, edema.  Radials/DP/PT 2+ and equal bilaterally.  Respiratory:  Stridor like breath sounds (more noticeable without stethoscope). GI: Soft, nontender, nondistended, BS + x 4. MS: no deformity or atrophy. Skin: warm and dry, no rash. Neuro:  Strength and sensation are intact. Psych: AAOx3.  Normal affect.  Labs    CBC  Recent Labs  01/16/16 1010 01/17/16 0158  WBC 11.7* 12.6*  NEUTROABS 10.0*  --   HGB 13.0 11.3*  HCT 39.5 34.3*  MCV 92.7 93.0  PLT 353 [id number]   Basic Metabolic Panel  Recent Labs  01/16/16 1051 01/17/16 0158 01/18/16 0411 01/18/16 0502  NA  --  134* 138 137  K  --  3.1* 4.2 4.0  CL  --  100* 105 104  CO2  --  24 27 24   GLUCOSE  --  276* 234* 228*  BUN  --  26* 29* 29*  CREATININE  --  1.21* 1.12* 1.08*  CALCIUM  --  8.6* 8.9 9.0  MG 1.4* 1.9  --   --    Liver Function Tests No results for input(s): AST, ALT, ALKPHOS, BILITOT, PROT, ALBUMIN in the last 72 hours. No results for input(s): LIPASE, AMYLASE in the last 72 hours. Cardiac Enzymes  Recent Labs  01/16/16 1505 01/16/16 1925 01/17/16 0158  TROPONINI 0.05* 0.12* 0.29*   BNP Invalid input(s): POCBNP D-Dimer  Recent Labs  01/16/16 1051  DDIMER <0.27   Hemoglobin A1C  Recent Labs  01/16/16 1529  HGBA1C 7.0*     Telemetry    No adverse rhythms - Personally Reviewed  ECG    Prior ST depression global with ST elevation aVR - Personally Reviewed  Radiology    Dg Chest Portable 1 View  Result Date: 01/16/2016 CLINICAL DATA:  Shortness of breath, wheezing, cough and  central chest tightness for 4 days, history asthma, hypertension, diabetes mellitus, former smoker EXAM: PORTABLE CHEST 1 VIEW COMPARISON:  Portable exam 1052 hours compared to 07/24/2015 FINDINGS: Mild enlargement of cardiac silhouette. Mediastinal contours and pulmonary vascularity normal. Elongation of thoracic aorta again noted. Lungs clear. No pleural effusion or pneumothorax. Bones unremarkable. IMPRESSION: No acute abnormalities. Mild enlargement of cardiac silhouette. Electronically Signed   By: Lavonia Dana M.D.   On: 01/16/2016 11:14     Cardiac Studies   Cath pending  ECHO 07/26/15: - Left ventricle: The cavity size was normal. Wall thickness was   increased in a pattern of mild LVH. Systolic function was normal.   The estimated ejection fraction was in the range of 60% to 65%.   Wall motion was normal; there were no regional wall motion   abnormalities. Doppler parameters are consistent with abnormal   left ventricular relaxation (grade 1 diastolic dysfunction). - Atrial septum: No defect or patent foramen ovale was identified.  Patient Profile     65 year old with tracheobronchitis with NSTEMI   Assessment & Plan    NSTEMI - coronary calcifications on CTA 06/2015 - RFs are FH CAD, HTN, HLD, DM, borderline obesity and remote tobacco use - ECG has dynamic changes with diffuse ST depression and AVR elevation concerning for triple vessel disease yesterday, recheck today. - troponin elevated 0.29 - IV heparin  - on ASA, statin, no BB 2nd asthma, ARB in hold - cath indicated.The risks and benefits of a cardiac catheterization including, but not limited to, death, stroke, MI, kidney damage and bleeding were discussed with the patient who indicates understanding and agrees to proceed.   Tracheobronchitis   - improving  - abx  - nebs  - per primary team  Hypokalemia  - resolved  DM  - A1c 7.0  Signed, Candee Furbish, MD  01/18/2016, 8:32 AM

## 2016-01-18 NOTE — Interval H&P Note (Signed)
History and Physical Interval Note:  01/18/2016 2:43 PM  [name]  has presented today for cardiac cath with the diagnosis of NSTEMI.  The various methods of treatment have been discussed with the patient and family. After consideration of risks, benefits and other options for treatment, the patient has consented to  Procedure(s): Left Heart Cath and Coronary Angiography (N/A) as a surgical intervention .  The patient's history has been reviewed, patient examined, no change in status, stable for surgery.  I have reviewed the patient's chart and labs.  Questions were answered to the patient's satisfaction.    Cath Lab Visit (complete for each Cath Lab visit)  Clinical Evaluation Leading to the Procedure:   ACS: Yes.    Non-ACS:    Anginal Classification: CCS III  Anti-ischemic medical therapy: No Therapy  Non-Invasive Test Results: No non-invasive testing performed  Prior CABG: No previous CABG         Lauree Chandler

## 2016-01-18 NOTE — Progress Notes (Signed)
Pt. Jewerly, cell phone and wallet sent to the safe to be locked up during procedure.  Alphonzo Lemmings, RN

## 2016-01-18 NOTE — Progress Notes (Signed)
Pt transferred to 6C08 s/p cardiac cath. Belongings sent by tech to new room

## 2016-01-18 NOTE — Progress Notes (Signed)
Droplet precautions discontinued per infection prevention.  

## 2016-01-18 NOTE — Progress Notes (Signed)
  Echocardiogram 2D Echocardiogram has been performed.  [name] 01/18/2016, 10:46 AM

## 2016-01-18 NOTE — Progress Notes (Addendum)
Inpatient Diabetes Program Recommendations  AACE/ADA: New Consensus Statement on Inpatient Glycemic Control (2015)  Target Ranges:  Prepandial:   less than 140 mg/dL      Peak postprandial:   less than 180 mg/dL (1-2 hours)      Critically ill patients:  140 - 180 mg/dL   Lab Results  Component Value Date   GLUCAP 265 (H) 01/18/2016   HGBA1C 7.0 (H) 01/16/2016    Review of Glycemic Control  Diabetes history: DM 2 Outpatient Diabetes medications: Metformin 500 mg Daily Current orders for Inpatient glycemic control: Novolog Moderate + HS scale, Lantus 20 units Daily  Inpatient Diabetes Program Recommendations:   Patient is still receiving IV Solumedrol 60 mg Q 6hrs. Noted patient to have Cardiac cath this am and will receive half of Lantus dose this am. Please consider giving patient the other half of lantus this evening after cath and consider increasing dose by 5 units (total of 25 units Daily) due to glucose levels still being in the mid 200 range. May also consider an increase in correction scale to Novolog Resistant.  Thanks,  Tama Headings RN, MSN, [facility] Inpatient Diabetes Coordinator Team Pager [phone] (8a-5p)

## 2016-01-18 NOTE — Progress Notes (Signed)
Per verbal order from MD Candiss Norse, pt to have q4 cbg while NPO and only receive 10units of Lantus. Will give Lantus per MD verbal orders.

## 2016-01-18 NOTE — Progress Notes (Signed)
PROGRESS NOTE                                                                                                                                                                                                             Patient Demographics:    [name], is a 65 y.o. female, DOB - [birth year], RH:[id number]  Admit date - 01/16/2016   Admitting Physician Allie Bossier, MD  Outpatient Primary MD for the patient is [name], MD  LOS - 2  Chief Complaint  Patient presents with  . Asthma       Brief Narrative    [name] is a 65 y.o. female with medical history significant for hypertension, diabetes on metformin, history of prior PE and asthma. This past Thursday patient began having upper respiratory infection symptoms with cough and congestion with productive yellow sputum. Because of this she developed asthma exacerbation symptoms which are typical for her with increased wheezing and tightness. She is not had fevers or chills. She's not had any GI symptoms such as nausea, vomiting or diarrhea. She went to her PCP who prescribed prednisone. In addition she utilized her normal nebulizers and MDIs without improvement in her symptoms. Because of increased respiratory distress she presented to the ER today. No documentation from ER to determine if patient was hypoxemic at presentation. She was wheezing significantly upon arrival and becoming dyspneic with talking. She was also reporting pleuritic chest tightness with taking a deep breath. She has been exposed to multiple sick contacts with similar symptoms prior to onset of her symptoms.   Subjective:    [name] today has, No headache, No chest pain, No abdominal pain - No Nausea, No new weakness tingling or numbness, + Cough & SOB.     Assessment  & Plan :      1. Acute on chronic asthma exacerbation. She still wheezing, continue IV steroids, added Singulair,  added nebulizer treatments both scheduled and as needed, will add Dulera as well and monitor. Respiratory panel positive for coronavirus likely URI exacerbated her asthma. Overall much better today.  2. Hypertension. Scheduled hydralazine and monitor.  3. History of PE. Normal d-dimer.  4. Hypokalemia. Replaced.  5. GERD. On PPI.  6.NSTEMI - pain free, on Meds ASA- Statin, and only following due for left heart cath on 01/18/2016.  7.DM2 - ISS and add Lantus for better control while on steroids.  CBG (last 3)   Recent Labs  01/17/16 1752 01/17/16 2140 01/18/16 0821  GLUCAP 271* 287* 265*      Family Communication  :  none  Code Status :  Full  Diet : heart-Low carb  Disposition Plan  :  Home 1-2 days  Consults  :  Cards  Procedures  :    DVT Prophylaxis  :  Lovenox   Lab Results  Component Value Date   PLT 277 01/17/2016    Inpatient Medications  Scheduled Meds: . albuterol  2.5 mg Nebulization TID  . aspirin  324 mg Oral Pre-Cath  . [START ON 01/19/2016] aspirin EC  81 mg Oral q morning - 10a  . atorvastatin  40 mg Oral q morning - 10a  . azithromycin  500 mg Oral Daily  . enoxaparin (LOVENOX) injection  40 mg Subcutaneous Q24H  . estradiol  1 mg Oral q morning - 10a  . hydrALAZINE  50 mg Oral Q8H  . insulin aspart  0-15 Units Subcutaneous TID WC  . insulin aspart  0-5 Units Subcutaneous QHS  . insulin glargine  20 Units Subcutaneous Daily  . methylPREDNISolone (SOLU-MEDROL) injection  60 mg Intravenous Q6H  . mometasone-formoterol  2 puff Inhalation BID  . montelukast  10 mg Oral QHS  . pantoprazole  40 mg Oral Daily  . sodium chloride flush  3 mL Intravenous Q12H  . sodium chloride flush  3 mL Intravenous Q12H   Continuous Infusions: . sodium chloride     PRN Meds:.sodium chloride, acetaminophen **OR** acetaminophen, ALPRAZolam, chlorpheniramine-HYDROcodone, famotidine, ipratropium-albuterol, nitroGLYCERIN, ondansetron **OR** ondansetron (ZOFRAN)  IV, sodium chloride flush  Antibiotics  :    Anti-infectives    Start     Dose/Rate Route Frequency Ordered Stop   01/17/16 1000  azithromycin (ZITHROMAX) tablet 500 mg     500 mg Oral Daily 01/17/16 0822 01/22/16 0959         Objective:   Vitals:   01/17/16 1512 01/17/16 2103 01/17/16 2145 01/18/16 0607  BP: (!) 162/74  (!) 184/81 (!) 165/68  Pulse: 92 88 96 75  Resp: 20 18 19 20   Temp: 98 F (36.7 C)  98.3 F (36.8 C) 97.7 F (36.5 C)  TempSrc: Oral  Oral Oral  SpO2:  98% 93% 98%    Wt Readings from Last 3 Encounters:  08/12/15 75.8 kg (167 lb)  07/21/15 74.3 kg (163 lb 12.8 oz)  02/23/15 70.3 kg (155 lb)     Intake/Output Summary (Last 24 hours) at 01/18/16 1041 Last data filed at 01/17/16 1923  Gross per 24 hour  Intake          1391.23 ml  Output                0 ml  Net          1391.23 ml     Physical Exam  Awake Alert, Oriented X 3, No new F.N deficits, Normal affect [place].AT,PERRAL Supple Neck,No JVD, No cervical lymphadenopathy appriciated.  Symmetrical Chest wall movement, Good air movement bilaterally, CTAB RRR,No Gallops,Rubs or new Murmurs, No Parasternal Heave +ve B.Sounds, Abd Soft, No tenderness, No organomegaly appriciated, No rebound - guarding or rigidity. No Cyanosis, Clubbing or edema, No new Rash or bruise      Data Review:    CBC  Recent Labs Lab 01/16/16 1010 01/17/16 0158  WBC 11.7* 12.6*  HGB 13.0 11.3*  HCT 39.5 34.3*  PLT 353 277  MCV 92.7 93.0  MCH 30.5 30.6  MCHC 32.9 32.9  RDW 13.5 13.9  LYMPHSABS 1.2  --   MONOABS 0.5  --   EOSABS 0.0  --   BASOSABS 0.0  --     Chemistries   Recent Labs Lab 01/16/16 1010 01/16/16 1051 01/17/16 0158 01/18/16 0411 01/18/16 0502  NA 137  --  134* 138 137  K 3.0*  --  3.1* 4.2 4.0  CL 97*  --  100* 105 104  CO2 29  --  24 27 24   GLUCOSE 232*  --  276* 234* 228*  BUN 20  --  26* 29* 29*  CREATININE 1.02*  --  1.21* 1.12* 1.08*  CALCIUM 9.6  --  8.6* 8.9 9.0  MG  --   1.4* 1.9  --   --    ------------------------------------------------------------------------------------------------------------------ No results for input(s): CHOL, HDL, LDLCALC, TRIG, CHOLHDL, LDLDIRECT in the last 72 hours.  Lab Results  Component Value Date   HGBA1C 7.0 (H) 01/16/2016   ------------------------------------------------------------------------------------------------------------------ No results for input(s): TSH, T4TOTAL, T3FREE, THYROIDAB in the last 72 hours.  Invalid input(s): FREET3 ------------------------------------------------------------------------------------------------------------------ No results for input(s): VITAMINB12, FOLATE, FERRITIN, TIBC, IRON, RETICCTPCT in the last 72 hours.  Coagulation profile  Recent Labs Lab 01/18/16 0502  INR 1.03     Recent Labs  01/16/16 1051  DDIMER <0.27    Cardiac Enzymes  Recent Labs Lab 01/16/16 1505 01/16/16 1925 01/17/16 0158  TROPONINI 0.05* 0.12* 0.29*   ------------------------------------------------------------------------------------------------------------------    Component Value Date/Time   BNP 35.5 01/16/2016 1010    Micro Results Recent Results (from the past 240 hour(s))  Respiratory Panel by PCR     Status: Abnormal   Collection Time: 01/16/16 12:30 PM  Result Value Ref Range Status   Adenovirus NOT DETECTED NOT DETECTED Final   Coronavirus 229E NOT DETECTED NOT DETECTED Final   Coronavirus HKU1 NOT DETECTED NOT DETECTED Final   Coronavirus NL63 NOT DETECTED NOT DETECTED Final   Coronavirus OC43 NOT DETECTED NOT DETECTED Final   Metapneumovirus NOT DETECTED NOT DETECTED Final   Rhinovirus / Enterovirus DETECTED (A) NOT DETECTED Final   Influenza A NOT DETECTED NOT DETECTED Final   Influenza B NOT DETECTED NOT DETECTED Final   Parainfluenza Virus 1 NOT DETECTED NOT DETECTED Final   Parainfluenza Virus 2 NOT DETECTED NOT DETECTED Final   Parainfluenza Virus 3 NOT  DETECTED NOT DETECTED Final   Parainfluenza Virus 4 NOT DETECTED NOT DETECTED Final   Respiratory Syncytial Virus NOT DETECTED NOT DETECTED Final   Bordetella pertussis NOT DETECTED NOT DETECTED Final   Chlamydophila pneumoniae NOT DETECTED NOT DETECTED Final   Mycoplasma pneumoniae NOT DETECTED NOT DETECTED Final    Radiology Reports Dg Chest Portable 1 View  Result Date: 01/16/2016 CLINICAL DATA:  Shortness of breath, wheezing, cough and central chest tightness for 4 days, history asthma, hypertension, diabetes mellitus, former smoker EXAM: PORTABLE CHEST 1 VIEW COMPARISON:  Portable exam 1052 hours compared to 07/24/2015 FINDINGS: Mild enlargement of cardiac silhouette. Mediastinal contours and pulmonary vascularity normal. Elongation of thoracic aorta again noted. Lungs clear. No pleural effusion or pneumothorax. Bones unremarkable. IMPRESSION: No acute abnormalities. Mild enlargement of cardiac silhouette. Electronically Signed   By: Lavonia Dana M.D.   On: 01/16/2016 11:14    Time Spent in minutes  30   Jerri Glauser K M.D on 01/18/2016 at 10:41 AM  Between 7am to 7pm - Pager - [phone]  After 7pm go to [email or web address] - password [facility]  [facility] -  Office  [phone]

## 2016-01-18 NOTE — Progress Notes (Addendum)
TR BAND REMOVAL  LOCATION:    right radial  DEFLATED PER PROTOCOL:    Yes.    TIME BAND OFF / DRESSING APPLIED:   01/19/16 @0000    SITE UPON ARRIVAL:    Level 0  SITE AFTER BAND REMOVAL:    Level 0  CIRCULATION SENSATION AND MOVEMENT:    Within Normal Limits   Yes.    COMMENTS:   Pt tolerated well

## 2016-01-18 NOTE — Progress Notes (Signed)
Patient Name: [name] Date of Encounter: 01/18/2016  Primary Cardiologist: [facility] Problem List     Principal Problem:   Acute bronchitis with asthma with acute exacerbation Active Problems:   Diabetes mellitus type 2, uncontrolled (HCC)   Essential hypertension   G E R D   PULMONARY EMBOLISM, HX OF   Hyperlipidemia   Chest pain   Acute hypokalemia   Hypomagnesemia   Acute respiratory failure (HCC)     Subjective   Still with upper airway noise (stridor like). No CP  Inpatient Medications    Scheduled Meds: . albuterol  2.5 mg Nebulization TID  . aspirin  324 mg Oral Pre-Cath  . [START ON 01/19/2016] aspirin EC  81 mg Oral q morning - 10a  . atorvastatin  40 mg Oral q morning - 10a  . azithromycin  500 mg Oral Daily  . enoxaparin (LOVENOX) injection  40 mg Subcutaneous Q24H  . estradiol  1 mg Oral q morning - 10a  . hydrALAZINE  50 mg Oral Q8H  . insulin aspart  0-15 Units Subcutaneous TID WC  . insulin aspart  0-5 Units Subcutaneous QHS  . insulin glargine  20 Units Subcutaneous Daily  . methylPREDNISolone (SOLU-MEDROL) injection  60 mg Intravenous Q6H  . mometasone-formoterol  2 puff Inhalation BID  . montelukast  10 mg Oral QHS  . pantoprazole  40 mg Oral Daily  . sodium chloride flush  3 mL Intravenous Q12H  . sodium chloride flush  3 mL Intravenous Q12H   Continuous Infusions: . sodium chloride     PRN Meds:.sodium chloride, acetaminophen **OR** acetaminophen, ALPRAZolam, chlorpheniramine-HYDROcodone, famotidine, ipratropium-albuterol, nitroGLYCERIN, ondansetron **OR** ondansetron (ZOFRAN) IV, sodium chloride flush   Vital Signs    Vitals:   01/17/16 1512 01/17/16 2103 01/17/16 2145 01/18/16 0607  BP: (!) 162/74  (!) 184/81 (!) 165/68  Pulse: 92 88 96 75  Resp: 20 18 19 20   Temp: 98 F (36.7 C)  98.3 F (36.8 C) 97.7 F (36.5 C)  TempSrc: Oral  Oral Oral  SpO2:  98% 93% 98%    Intake/Output Summary (Last 24 hours) at 01/18/16  0832 Last data filed at 01/17/16 1923  Gross per 24 hour  Intake          1831.23 ml  Output                0 ml  Net          1831.23 ml   There were no vitals filed for this visit.  Physical Exam    GEN: Well nourished, well developed, in no acute distress.  HEENT: Grossly normal.  Neck: Supple, no JVD, carotid bruits, or masses. Cardiac: RRR, no murmurs, rubs, or gallops. No clubbing, cyanosis, edema.  Radials/DP/PT 2+ and equal bilaterally.  Respiratory:  Stridor like breath sounds (more noticeable without stethoscope). GI: Soft, nontender, nondistended, BS + x 4. MS: no deformity or atrophy. Skin: warm and dry, no rash. Neuro:  Strength and sensation are intact. Psych: AAOx3.  Normal affect.  Labs    CBC  Recent Labs  01/16/16 1010 01/17/16 0158  WBC 11.7* 12.6*  NEUTROABS 10.0*  --   HGB 13.0 11.3*  HCT 39.5 34.3*  MCV 92.7 93.0  PLT 353 99991111   Basic Metabolic Panel  Recent Labs  01/16/16 1051 01/17/16 0158 01/18/16 0411 01/18/16 0502  NA  --  134* 138 137  K  --  3.1* 4.2 4.0  CL  --  100* 105 104  CO2  --  24 27 24   GLUCOSE  --  276* 234* 228*  BUN  --  26* 29* 29*  CREATININE  --  1.21* 1.12* 1.08*  CALCIUM  --  8.6* 8.9 9.0  MG 1.4* 1.9  --   --    Liver Function Tests No results for input(s): AST, ALT, ALKPHOS, BILITOT, PROT, ALBUMIN in the last 72 hours. No results for input(s): LIPASE, AMYLASE in the last 72 hours. Cardiac Enzymes  Recent Labs  01/16/16 1505 01/16/16 1925 01/17/16 0158  TROPONINI 0.05* 0.12* 0.29*   BNP Invalid input(s): POCBNP D-Dimer  Recent Labs  01/16/16 1051  DDIMER <0.27   Hemoglobin A1C  Recent Labs  01/16/16 1529  HGBA1C 7.0*     Telemetry    No adverse rhythms - Personally Reviewed  ECG    Prior ST depression global with ST elevation aVR - Personally Reviewed  Radiology    Dg Chest Portable 1 View  Result Date: 01/16/2016 CLINICAL DATA:  Shortness of breath, wheezing, cough and  central chest tightness for 4 days, history asthma, hypertension, diabetes mellitus, former smoker EXAM: PORTABLE CHEST 1 VIEW COMPARISON:  Portable exam 1052 hours compared to 07/24/2015 FINDINGS: Mild enlargement of cardiac silhouette. Mediastinal contours and pulmonary vascularity normal. Elongation of thoracic aorta again noted. Lungs clear. No pleural effusion or pneumothorax. Bones unremarkable. IMPRESSION: No acute abnormalities. Mild enlargement of cardiac silhouette. Electronically Signed   By: Lavonia Dana M.D.   On: 01/16/2016 11:14     Cardiac Studies   Cath pending  ECHO 07/26/15: - Left ventricle: The cavity size was normal. Wall thickness was   increased in a pattern of mild LVH. Systolic function was normal.   The estimated ejection fraction was in the range of 60% to 65%.   Wall motion was normal; there were no regional wall motion   abnormalities. Doppler parameters are consistent with abnormal   left ventricular relaxation (grade 1 diastolic dysfunction). - Atrial septum: No defect or patent foramen ovale was identified.  Patient Profile     65 year old with tracheobronchitis with NSTEMI   Assessment & Plan    NSTEMI - coronary calcifications on CTA 06/2015 - RFs are FH CAD, HTN, HLD, DM, borderline obesity and remote tobacco use - ECG has dynamic changes with diffuse ST depression and AVR elevation concerning for triple vessel disease yesterday, recheck today. - troponin elevated 0.29 - IV heparin  - on ASA, statin, no BB 2nd asthma, ARB in hold - cath indicated.The risks and benefits of a cardiac catheterization including, but not limited to, death, stroke, MI, kidney damage and bleeding were discussed with the patient who indicates understanding and agrees to proceed.   Tracheobronchitis   - improving  - abx  - nebs  - per primary team  Hypokalemia  - resolved  DM  - A1c 7.0  Signed, Candee Furbish, MD  01/18/2016, 8:32 AM

## 2016-01-19 ENCOUNTER — Encounter (HOSPITAL_COMMUNITY): Payer: Self-pay | Admitting: Cardiovascular Disease

## 2016-01-19 HISTORY — PX: CORONARY STENT PLACEMENT: SHX1402

## 2016-01-19 LAB — BASIC METABOLIC PANEL
ANION GAP: 10 (ref 5–15)
BUN: 29 mg/dL — ABNORMAL HIGH (ref 6–20)
CHLORIDE: 104 mmol/L (ref 101–111)
CO2: 21 mmol/L — AB (ref 22–32)
Calcium: 8.8 mg/dL — ABNORMAL LOW (ref 8.9–10.3)
Creatinine, Ser: 0.94 mg/dL (ref 0.44–1.00)
GFR calc non Af Amer: >60 mL/min (ref >60)
GLUCOSE: 178 mg/dL — AB (ref 65–99)
Potassium: 4 mmol/L (ref 3.5–5.1)
Sodium: 135 mmol/L (ref 135–145)

## 2016-01-19 LAB — CBC
HEMATOCRIT: 37.8 % (ref 36.0–46.0)
HEMOGLOBIN: 12 g/dL (ref 12.0–15.0)
MCH: 29.9 pg (ref 26.0–34.0)
MCHC: 31.7 g/dL (ref 30.0–36.0)
MCV: 94 fL (ref 78.0–100.0)
Platelets: 364 10*3/uL (ref 150–400)
RBC: 4.02 MIL/uL (ref 3.87–5.11)
RDW: 14.3 % (ref 11.5–15.5)
WBC: 17 10*3/uL — ABNORMAL HIGH (ref 4.0–10.5)

## 2016-01-19 LAB — GLUCOSE, CAPILLARY
GLUCOSE-CAPILLARY: 166 mg/dL — AB (ref 65–99)
GLUCOSE-CAPILLARY: 251 mg/dL — AB (ref 65–99)
Glucose-Capillary: 229 mg/dL — ABNORMAL HIGH (ref 65–99)
Glucose-Capillary: 201 mg/dL — ABNORMAL HIGH (ref 65–99)

## 2016-01-19 MED ORDER — IRBESARTAN 150 MG PO TABS
150.0000 mg | ORAL_TABLET | Freq: Every day | ORAL | Status: DC
  Administered 2016-01-19 – 2016-01-21 (×3): 150 mg via ORAL
  Filled 2016-01-19 (×2): qty 2
  Filled 2016-01-19: qty 1

## 2016-01-19 MED ORDER — FUROSEMIDE 10 MG/ML IJ SOLN
60.0000 mg | Freq: Once | INTRAMUSCULAR | Status: AC
  Administered 2016-01-19: 09:00:00 60 mg via INTRAVENOUS
  Filled 2016-01-19: qty 6

## 2016-01-19 MED ORDER — DILTIAZEM HCL ER 60 MG PO CP12
60.0000 mg | ORAL_CAPSULE | Freq: Every day | ORAL | Status: DC
  Administered 2016-01-19: 60 mg via ORAL
  Filled 2016-01-19 (×2): qty 1

## 2016-01-19 MED ORDER — FUROSEMIDE 10 MG/ML IJ SOLN: 40.0000 mg | Freq: Once | INTRAMUSCULAR | Status: DC

## 2016-01-19 MED ORDER — FLUTICASONE PROPIONATE 50 MCG/ACT NA SUSP
2.0000 | Freq: Two times a day (BID) | NASAL | Status: DC | PRN
  Filled 2016-01-19: qty 16

## 2016-01-19 NOTE — Progress Notes (Addendum)
PROGRESS NOTE                                                                                                                                                                                                             Patient Demographics:    [name], is a 65 y.o. [name], DOB - [birth year], [id number]  Admit date - 01/16/2016   Admitting Physician Allie Bossier, MD  Outpatient Primary MD for the patient is [name], MD  LOS - 3  Chief Complaint  Patient presents with  . Asthma       Brief Narrative    [name] is a 65 y.o. [name] with medical history significant for hypertension, diabetes on metformin, history of prior PE and asthma. This past Thursday patient began having upper respiratory infection symptoms with cough and congestion with productive yellow sputum. Because of this she developed asthma exacerbation symptoms which are typical for her with increased wheezing and tightness. She is not had fevers or chills. She's not had any GI symptoms such as nausea, vomiting or diarrhea. She went to her PCP who prescribed prednisone. In addition she utilized her normal nebulizers and MDIs without improvement in her symptoms. Because of increased respiratory distress she presented to the ER today. No documentation from ER to determine if patient was hypoxemic at presentation. She was wheezing significantly upon arrival and becoming dyspneic with talking. She was also reporting pleuritic chest tightness with taking a deep breath. She has been exposed to multiple sick contacts with similar symptoms prior to onset of her symptoms.   Subjective:    [name] today has, No headache, No chest pain, No abdominal pain - No Nausea, No new weakness tingling or numbness, + Cough & SOB.     Assessment  & Plan :      1. Acute on chronic asthma exacerbation. She still wheezing, continue IV steroids, added Singulair,  added nebulizer treatments both scheduled and as needed, will add Dulera as well and monitor. Respiratory panel positive for coronavirus likely URI exacerbated her asthma. Overall much better today.  2. Hypertension. Hydralazine, ARB + Cardizem.  3. History of PE. Normal d-dimer.  4. Hypokalemia. Replaced.  5. GERD. On PPI.  6.NSTEMI - pain free, seen by Cards post left heart cath on 01/18/2016 - Severe single vessel CAD with  severe stenosis in the mid and distal RCA with successful PTCA/DES x 2 mid and distal RCA, now on Dual anti-platelet therapy with ASA and Brilinta for one year. Continue statin & Cardizem, no B.Blocker due to Asthma, pain free.  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  8. DM2 - ISS and add Lantus for better control while on steroids.  CBG (last 3)   Recent Labs  01/18/16 1628 01/18/16 2129 01/19/16 0540  GLUCAP 163* 242* 229*      Family Communication  :  none  Code Status :  Full  Diet : heart-Low carb  Disposition Plan  :  Home 1-2 days  Consults  :  Cards  Procedures  :    L.Heart Cath -    Mid RCA lesion, 70 %stenosed.  Dist RCA lesion, 90 %stenosed.  A STENT SYNERGY DES [id number] drug eluting stent was successfully placed, and overlaps the 2.5 x 38 mm Synergy stent placed distally.  Prox RCA lesion, 80 %stenosed.  Post intervention, there is a 0% residual stenosis.  Ost Cx to Prox Cx lesion, 10 %stenosed.  Mid LAD lesion, 10 %stenosed.   1. NSTEMI 2. Severe single vessel CAD with severe stenosis in the mid and distal RCA 3. Successful PTCA/DES x 2 mid and distal RCA  Recommendations: Dual anti-platelet therapy with ASA and Brilinta for one year. Continue statin.     TTE -   Left ventricle: The cavity size was normal. Septal wall thickness was increased in a pattern of severe LVH with mild hypertrophy of the posterior wall. There was severe concentric hypertrophy. Systolic function was  vigorous. The estimated ejection fraction was in the range of 65% to 70%. Wall motion was normal; there were no regional wall motion abnormalities. Features are consistent with a pseudonormal left ventricular filling pattern, with concomitant abnormal relaxation and increased filling pressure (grade 2 diastolic dysfunction). Doppler parameters are consistent with high ventricular filling pressure. - Aortic valve: Transvalvular velocity was within the normal range. There was no stenosis. There was no regurgitation. - Mitral valve: Transvalvular velocity was within the normal range. There was no evidence for stenosis. There was mild regurgitation. - Right ventricle: The cavity size was normal. Wall thickness was normal. Systolic function was normal. - Tricuspid valve: There was trivial regurgitation. - Pulmonary arteries: Systolic pressure was within the normal range.   DVT Prophylaxis  :  Lovenox   Lab Results  Component Value Date   PLT 364 01/19/2016    Inpatient Medications  Scheduled Meds: . albuterol  2.5 mg Nebulization TID  . aspirin EC  81 mg Oral q morning - 10a  . atorvastatin  40 mg Oral q morning - 10a  . azithromycin  500 mg Oral Daily  . diltiazem  60 mg Oral Daily  . estradiol  1 mg Oral q morning - 10a  . furosemide  60 mg Intravenous Once  . hydrALAZINE  50 mg Oral Q8H  . insulin aspart  0-15 Units Subcutaneous TID WC  . insulin aspart  0-5 Units Subcutaneous QHS  . insulin glargine  20 Units Subcutaneous Daily  . irbesartan  150 mg Oral Daily  . methylPREDNISolone (SOLU-MEDROL) injection  60 mg Intravenous Q12H  . mometasone-formoterol  2 puff Inhalation BID  . montelukast  10 mg Oral QHS  . pantoprazole  40 mg Oral Daily  . sodium chloride flush  3 mL Intravenous Q12H  . sodium  chloride flush  3 mL Intravenous Q12H  . ticagrelor  90 mg Oral Q12H   Continuous Infusions:   PRN Meds:.sodium chloride, acetaminophen **OR** acetaminophen, ALPRAZolam,  chlorpheniramine-HYDROcodone, famotidine, fluticasone, ipratropium-albuterol, nitroGLYCERIN, ondansetron **OR** ondansetron (ZOFRAN) IV, sodium chloride flush  Antibiotics  :    Anti-infectives    Start     Dose/Rate Route Frequency Ordered Stop   01/17/16 1000  azithromycin (ZITHROMAX) tablet 500 mg     500 mg Oral Daily 01/17/16 0822 01/22/16 0959         Objective:   Vitals:   01/19/16 0250 01/19/16 0253 01/19/16 0722 01/19/16 0725  BP: (!) 199/90 (!) 199/98 (!) 208/85 (!) 191/75  Pulse: 77     Resp: 14  (!) 23 (!) 26  Temp: 97.5 F (36.4 C)     TempSrc: Oral     SpO2: 100%     Weight: 76 kg (167 lb 8.8 oz)       Wt Readings from Last 3 Encounters:  01/19/16 76 kg (167 lb 8.8 oz)  08/12/15 75.8 kg (167 lb)  07/21/15 74.3 kg (163 lb 12.8 oz)     Intake/Output Summary (Last 24 hours) at 01/19/16 0847 Last data filed at 01/19/16 0541  Gross per 24 hour  Intake              480 ml  Output             1200 ml  Net             -720 ml     Physical Exam  Awake Alert, Oriented X 3, No new F.N deficits, Normal affect [place].AT,PERRAL Supple Neck,No JVD, No cervical lymphadenopathy appriciated.  Symmetrical Chest wall movement, Good air movement bilaterally, few rales n wheezes RRR,No Gallops,Rubs or new Murmurs, No Parasternal Heave +ve B.Sounds, Abd Soft, No tenderness, No organomegaly appriciated, No rebound - guarding or rigidity. No Cyanosis, Clubbing or edema, No new Rash or bruise      Data Review:    CBC  Recent Labs Lab 01/16/16 1010 01/17/16 0158 01/19/16 0313  WBC 11.7* 12.6* 17.0*  HGB 13.0 11.3* 12.0  HCT 39.5 34.3* 37.8  PLT 353 277 364  MCV 92.7 93.0 94.0  MCH 30.5 30.6 29.9  MCHC 32.9 32.9 31.7  RDW 13.5 13.9 14.3  LYMPHSABS 1.2  --   --   MONOABS 0.5  --   --   EOSABS 0.0  --   --   BASOSABS 0.0  --   --     Chemistries   Recent Labs Lab 01/16/16 1010 01/16/16 1051 01/17/16 0158 01/18/16 0411 01/18/16 0502 01/19/16 0313  NA  137  --  134* 138 137 135  K 3.0*  --  3.1* 4.2 4.0 4.0  CL 97*  --  100* 105 104 104  CO2 29  --  24 27 24  21*  GLUCOSE 232*  --  276* 234* 228* 178*  BUN 20  --  26* 29* 29* 29*  CREATININE 1.02*  --  1.21* 1.12* 1.08* 0.94  CALCIUM 9.6  --  8.6* 8.9 9.0 8.8*  MG  --  1.4* 1.9  --   --   --    ------------------------------------------------------------------------------------------------------------------ No results for input(s): CHOL, HDL, LDLCALC, TRIG, CHOLHDL, LDLDIRECT in the last 72 hours.  Lab Results  Component Value Date   HGBA1C 7.0 (H) 01/16/2016   ------------------------------------------------------------------------------------------------------------------ No results for input(s): TSH, T4TOTAL, T3FREE, THYROIDAB in the last 72 hours.  Invalid input(s): FREET3 ------------------------------------------------------------------------------------------------------------------ No results for input(s): VITAMINB12, FOLATE, FERRITIN, TIBC, IRON, RETICCTPCT in the last 72 hours.  Coagulation profile  Recent Labs Lab 01/18/16 0502  INR 1.03     Recent Labs  01/16/16 1051  DDIMER <0.27    Cardiac Enzymes  Recent Labs Lab 01/16/16 1505 01/16/16 1925 01/17/16 0158  TROPONINI 0.05* 0.12* 0.29*   ------------------------------------------------------------------------------------------------------------------    Component Value Date/Time   BNP 35.5 01/16/2016 1010    Micro Results Recent Results (from the past 240 hour(s))  Respiratory Panel by PCR     Status: Abnormal   Collection Time: 01/16/16 12:30 PM  Result Value Ref Range Status   Adenovirus NOT DETECTED NOT DETECTED Final   Coronavirus 229E NOT DETECTED NOT DETECTED Final   Coronavirus HKU1 NOT DETECTED NOT DETECTED Final   Coronavirus NL63 NOT DETECTED NOT DETECTED Final   Coronavirus OC43 NOT DETECTED NOT DETECTED Final   Metapneumovirus NOT DETECTED NOT DETECTED Final   Rhinovirus /  Enterovirus DETECTED (A) NOT DETECTED Final   Influenza A NOT DETECTED NOT DETECTED Final   Influenza B NOT DETECTED NOT DETECTED Final   Parainfluenza Virus 1 NOT DETECTED NOT DETECTED Final   Parainfluenza Virus 2 NOT DETECTED NOT DETECTED Final   Parainfluenza Virus 3 NOT DETECTED NOT DETECTED Final   Parainfluenza Virus 4 NOT DETECTED NOT DETECTED Final   Respiratory Syncytial Virus NOT DETECTED NOT DETECTED Final   Bordetella pertussis NOT DETECTED NOT DETECTED Final   Chlamydophila pneumoniae NOT DETECTED NOT DETECTED Final   Mycoplasma pneumoniae NOT DETECTED NOT DETECTED Final    Radiology Reports Dg Chest Portable 1 View  Result Date: 01/16/2016 CLINICAL DATA:  Shortness of breath, wheezing, cough and central chest tightness for 4 days, history asthma, hypertension, diabetes mellitus, former smoker EXAM: PORTABLE CHEST 1 VIEW COMPARISON:  Portable exam 1052 hours compared to 07/24/2015 FINDINGS: Mild enlargement of cardiac silhouette. Mediastinal contours and pulmonary vascularity normal. Elongation of thoracic aorta again noted. Lungs clear. No pleural effusion or pneumothorax. Bones unremarkable. IMPRESSION: No acute abnormalities. Mild enlargement of cardiac silhouette. Electronically Signed   By: Lavonia Dana M.D.   On: 01/16/2016 11:14    Time Spent in minutes  30   Omni Dunsworth K M.D on 01/19/2016 at 8:47 AM  Between 7am to 7pm - Pager - [phone]  After 7pm go to [email or web address] - password [facility]  [facility] -  Office  [phone]

## 2016-01-19 NOTE — Care Management Note (Addendum)
Case Management Note  Patient Details  Name: DAIJANAE ANGELOPOULOS MRN: [id number] Date of Birth: 01/22/1951  Subjective/Objective:   Patient is s/p coronary stent intervention, will be on Brilinta.  [facility] stock.  She has a pcp, medication coverage and transport at time of dc.     , S/W ROBIN @  [facility] # [phone]   BRILINTA  90 MG  BID ( 30 ) 60 TAB   COVER- YES  CO-PAY- $25.00  TIER- 2 DRUG  PRIOR APPROVAL - NO  [facility] : [facility]                 Action/Plan:   Expected Discharge Date:                  Expected Discharge Plan:  Home/Self Care (Lives with husband)  In-House Referral:     Discharge planning Services  CM Consult  Post Acute Care Choice:    Choice offered to:     DME Arranged:    DME Agency:     HH Arranged:    [place] Agency:     Status of Service:  Completed, signed off  If discussed at [employer], dates discussed:    Additional Comments:  Zenon Mayo, RN 01/19/2016, 10:32 AM

## 2016-01-19 NOTE — Care Management Note (Signed)
Case Management Note  Patient Details  Name: [name] MRN: [id number] Date of Birth: Apr 25, 1951  Subjective/Objective:  Patient indep pta per previous NCM note.  She is s/p coronary intervention will be on  Brilinta, NCM awaiting benefit check.                  Action/Plan:   Expected Discharge Date:                  Expected Discharge Plan:  Home/Self Care (Lives with husband)  In-House Referral:     Discharge planning Services  CM Consult  Post Acute Care Choice:    Choice offered to:     DME Arranged:    DME Agency:     HH Arranged:    HH Agency:     Status of Service:  In process, will continue to follow  If discussed at Long Length of Stay Meetings, dates discussed:    Additional Comments:  Zenon Mayo, RN 01/19/2016, 9:05 AM

## 2016-01-19 NOTE — Progress Notes (Signed)
At approx 0Pt ambulated to bathroom. approx 20 feet total. Upon returning to bed called out for nurse. Upon assessment pt is in obvious respiratory distress RR >40 per minute; tripod position, speaks 1 word, at best, between breaths. Audible wheezes heard in room. RT called by Ricki, RN. I administered neb as ordered via simple face mask. Upon auscultation lung bases were very diminished; all other lobes have expiratory wheezes. Post treatment pt still has expiratory wheezing, however denies distress at this time, and is sitting in a relaxed semi-fowlers position. RT at bedside and changed orders to pts home neb schedule which is a Q4h neb; plus PRN for rescue. Pt has no complaints at this time other than pain from cough. Medication given.

## 2016-01-19 NOTE — Progress Notes (Signed)
S/W ROBIN @  [facility] # [id number]   BRILINTA  90 MG  BID ( 30 ) 60 TAB   COVER- YES  CO-PAY- $25.00  TIER- 2 DRUG  PRIOR APPROVAL - NO  [facility] : [facility]

## 2016-01-19 NOTE — Progress Notes (Signed)
Inpatient Diabetes Program Recommendations  AACE/ADA: New Consensus Statement on Inpatient Glycemic Control (2015)  Target Ranges:  Prepandial:   less than 140 mg/dL      Peak postprandial:   less than 180 mg/dL (1-2 hours)      Critically ill patients:  140 - 180 mg/dL   Lab Results  Component Value Date   GLUCAP 229 (H) 01/19/2016   HGBA1C 7.0 (H) 01/16/2016   Review of Glycemic Control  Diabetes history:DM 2 Outpatient Diabetes medications: Metformin 500 mg Daily Current orders for Inpatient glycemic control: Novolog Moderate + HS scale, Lantus 20 units Daily  Inpatient Diabetes Program Recommendations:   Patient is still receiving IV Solumedrol 60 mg Q 6hrs. Noted patient to had cardiac cath yesterday and received half of Lantus dose.  If steroids are continued, please consider increasing Lantus to 25 units due to glucose levels still being in the mid 200 range. May also consider an increase in correction scale to Novolog Resistant.  Thanks,  Tama Headings RN, MSN, [facility] Inpatient Diabetes Coordinator Team Pager [phone] (8a-5p)

## 2016-01-19 NOTE — Progress Notes (Signed)
CARDIAC REHAB PHASE I   PRE:  Rate/Rhythm: 74 SR  BP:  Sitting: 204/82        SaO2: 96 RA   Pt BP remains elevated, hold ambulation right now per RN.  Completed MI/stent education with pt and family at bedside.  Reviewed risk factors, MI book, anti-platelet therapy, stent card, activity restrictions, ntg, exercise, heart healthy diet, carb counting, portion control, sodium restrictions, CHF booklet and zone tool, daily weights and phase 2 cardiac rehab. Pt verbalized understanding, receptive to education, states she is somewhat overwhelmed. Pt agrees to phase 2 cardiac rehab referral, will send to [facility] per pt request. Pt in recliner, call bell within reach, will follow-up tomorrow for ambulation.   [id number] Lenna Sciara, RN, BSN 01/19/2016 10:33 AM

## 2016-01-19 NOTE — Progress Notes (Signed)
Patient Name: [name] Date of Encounter: 01/19/2016  Primary Cardiologist: [facility] Problem List     Principal Problem:   Acute bronchitis with asthma with acute exacerbation Active Problems:   Diabetes mellitus type 2, uncontrolled (HCC)   Essential hypertension   G E R D   PULMONARY EMBOLISM, HX OF   Hyperlipidemia   Chest pain   Acute hypokalemia   Hypomagnesemia   Acute respiratory failure (HCC)   NSTEMI (non-ST elevated myocardial infarction) ([place])     Subjective   Still with upper airway noise (stridor like). No CP. Post cath  Inpatient Medications    Scheduled Meds: . albuterol  2.5 mg Nebulization TID  . aspirin EC  81 mg Oral q morning - 10a  . atorvastatin  40 mg Oral q morning - 10a  . azithromycin  500 mg Oral Daily  . diltiazem  60 mg Oral Daily  . estradiol  1 mg Oral q morning - 10a  . hydrALAZINE  50 mg Oral Q8H  . insulin aspart  0-15 Units Subcutaneous TID WC  . insulin aspart  0-5 Units Subcutaneous QHS  . insulin glargine  20 Units Subcutaneous Daily  . irbesartan  150 mg Oral Daily  . methylPREDNISolone (SOLU-MEDROL) injection  60 mg Intravenous Q12H  . mometasone-formoterol  2 puff Inhalation BID  . montelukast  10 mg Oral QHS  . pantoprazole  40 mg Oral Daily  . sodium chloride flush  3 mL Intravenous Q12H  . sodium chloride flush  3 mL Intravenous Q12H  . ticagrelor  90 mg Oral Q12H   Continuous Infusions:   PRN Meds:.sodium chloride, acetaminophen **OR** acetaminophen, ALPRAZolam, chlorpheniramine-HYDROcodone, famotidine, fluticasone, ipratropium-albuterol, nitroGLYCERIN, ondansetron **OR** ondansetron (ZOFRAN) IV, sodium chloride flush   Vital Signs    Vitals:   01/19/16 0253 01/19/16 0722 01/19/16 0725 01/19/16 0819  BP: (!) 199/98 (!) 208/85 (!) 191/75 (!) 203/74  Pulse:    85  Resp:  (!) 23 (!) 26 20  Temp:    97.5 F (36.4 C)  TempSrc:    Oral  SpO2:    100%  Weight:        Intake/Output Summary (Last  24 hours) at 01/19/16 1133 Last data filed at 01/19/16 0541  Gross per 24 hour  Intake              480 ml  Output             1200 ml  Net             -720 ml   Filed Weights   01/19/16 0250  Weight: 167 lb 8.8 oz (76 kg)    Physical Exam    GEN: Well nourished, well developed, in no acute distress.  HEENT: Grossly normal.  Neck: Supple, no JVD, carotid bruits, or masses. Cardiac: RRR, no murmurs, rubs, or gallops. No clubbing, cyanosis, edema.  Radials/DP/PT 2+ and equal bilaterally. Cath site normal Respiratory:  Stridor like breath sounds (more noticeable without stethoscope). GI: Soft, nontender, nondistended, BS + x 4. MS: no deformity or atrophy. Skin: warm and dry, no rash. Neuro:  Strength and sensation are intact. Psych: AAOx3.  Normal affect.  Labs    CBC  Recent Labs  01/17/16 0158 01/19/16 0313  WBC 12.6* 17.0*  HGB 11.3* 12.0  HCT 34.3* 37.8  MCV 93.0 94.0  PLT [id number]   Basic Metabolic Panel  Recent Labs  01/17/16 0158  01/18/16 0502 01/19/16 [id number]  NA 134*  < > 137 135  K 3.1*  < > 4.0 4.0  CL 100*  < > 104 104  CO2 24  < > 24 21*  GLUCOSE 276*  < > 228* 178*  BUN 26*  < > 29* 29*  CREATININE 1.21*  < > 1.08* 0.94  CALCIUM 8.6*  < > 9.0 8.8*  MG 1.9  --   --   --   < > = values in this interval not displayed. Liver Function Tests No results for input(s): AST, ALT, ALKPHOS, BILITOT, PROT, ALBUMIN in the last 72 hours. No results for input(s): LIPASE, AMYLASE in the last 72 hours. Cardiac Enzymes  Recent Labs  01/16/16 1505 01/16/16 1925 01/17/16 0158  TROPONINI 0.05* 0.12* 0.29*   BNP Invalid input(s): POCBNP D-Dimer No results for input(s): DDIMER in the last 72 hours. Hemoglobin A1C  Recent Labs  01/16/16 1529  HGBA1C 7.0*     Telemetry    No adverse rhythms - Personally Reviewed  ECG    Prior ST depression global with ST elevation aVR - Personally Reviewed  Radiology    No results found.   Cardiac Studies      ECHO 07/26/15: - Left ventricle: The cavity size was normal. Wall thickness was   increased in a pattern of mild LVH. Systolic function was normal.   The estimated ejection fraction was in the range of 60% to 65%.   Wall motion was normal; there were no regional wall motion   abnormalities. Doppler parameters are consistent with abnormal   left ventricular relaxation (grade 1 diastolic dysfunction). - Atrial septum: No defect or patent foramen ovale was identified.  Cath: 01/19/16  Mid RCA lesion, 70 %stenosed.  Dist RCA lesion, 90 %stenosed.  A STENT SYNERGY DES [id number] drug eluting stent was successfully placed, and overlaps the 2.5 x 38 mm Synergy stent placed distally.  Prox RCA lesion, 80 %stenosed.  Post intervention, there is a 0% residual stenosis.  Ost Cx to Prox Cx lesion, 10 %stenosed.  Mid LAD lesion, 10 %stenosed.   1. NSTEMI 2. Severe single vessel CAD with severe stenosis in the mid and distal RCA 3. Successful PTCA/DES x 2 mid and distal RCA  Recommendations: Dual anti-platelet therapy with ASA and Brilinta for one year. Continue statin  Patient Profile     65 year old with tracheobronchitis with NSTEMI   Assessment & Plan    NSTEMI  - 2 mid to distal RCA stents placed - troponin elevated 0.29 - on ASA, Brilinta, statin, no BB 2nd asthma, ARB in hold - DAPT for one year - aggressive secondary prevention. Cardiac rehab   Tracheobronchitis   - improving  - abx  - nebs  - per primary team  Hypokalemia  - resolved  DM  - A1c 7.0  Essential hypertension  - BP now elevated  - OK to restart Diovan - HCT as outpatient  Signed, Candee Furbish, MD  01/19/2016, 11:33 AM

## 2016-01-19 NOTE — Care Management Important Message (Signed)
Important Message  Patient Details  Name: [name] MRN: [id number] Date of Birth: 12-15-1950   Medicare Important Message Given:  Yes    Tatum Corl 01/19/2016, 10:01 AM

## 2016-01-20 ENCOUNTER — Encounter (HOSPITAL_COMMUNITY): Payer: Self-pay | Admitting: Physician Assistant

## 2016-01-20 DIAGNOSIS — I2510 Atherosclerotic heart disease of native coronary artery without angina pectoris: Secondary | ICD-10-CM

## 2016-01-20 DIAGNOSIS — I119 Hypertensive heart disease without heart failure: Secondary | ICD-10-CM

## 2016-01-20 LAB — BASIC METABOLIC PANEL
ANION GAP: 10 (ref 5–15)
BUN: 26 mg/dL — ABNORMAL HIGH (ref 6–20)
CO2: 26 mmol/L (ref 22–32)
Calcium: 8.7 mg/dL — ABNORMAL LOW (ref 8.9–10.3)
Chloride: 98 mmol/L — ABNORMAL LOW (ref 101–111)
Creatinine, Ser: 0.98 mg/dL (ref 0.44–1.00)
GFR, EST NON AFRICAN AMERICAN: 58 mL/min — AB (ref >60)
Glucose, Bld: 170 mg/dL — ABNORMAL HIGH (ref 65–99)
POTASSIUM: 4.3 mmol/L (ref 3.5–5.1)
SODIUM: 134 mmol/L — AB (ref 135–145)

## 2016-01-20 LAB — HEPATIC FUNCTION PANEL
ALBUMIN: 3.4 g/dL — AB (ref 3.5–5.0)
ALK PHOS: 52 U/L (ref 38–126)
ALT: 26 U/L (ref 14–54)
AST: 20 U/L (ref 15–41)
BILIRUBIN TOTAL: 1 mg/dL (ref 0.3–1.2)
Bilirubin, Direct: 0.1 mg/dL (ref 0.1–0.5)
Indirect Bilirubin: 0.9 mg/dL (ref 0.3–0.9)
TOTAL PROTEIN: 6.4 g/dL — AB (ref 6.5–8.1)

## 2016-01-20 LAB — CBC
HEMATOCRIT: 37.8 % (ref 36.0–46.0)
HEMOGLOBIN: 12.5 g/dL (ref 12.0–15.0)
MCH: 30.3 pg (ref 26.0–34.0)
MCHC: 33.1 g/dL (ref 30.0–36.0)
MCV: 91.7 fL (ref 78.0–100.0)
Platelets: 370 10*3/uL (ref 150–400)
RBC: 4.12 MIL/uL (ref 3.87–5.11)
RDW: 14.2 % (ref 11.5–15.5)
WBC: 16.2 10*3/uL — AB (ref 4.0–10.5)

## 2016-01-20 LAB — GLUCOSE, CAPILLARY
GLUCOSE-CAPILLARY: 172 mg/dL — AB (ref 65–99)
Glucose-Capillary: 187 mg/dL — ABNORMAL HIGH (ref 65–99)
Glucose-Capillary: 257 mg/dL — ABNORMAL HIGH (ref 65–99)
Glucose-Capillary: 274 mg/dL — ABNORMAL HIGH (ref 65–99)

## 2016-01-20 MED ORDER — FUROSEMIDE 10 MG/ML IJ SOLN
40.0000 mg | Freq: Once | INTRAMUSCULAR | Status: AC
  Administered 2016-01-20: 09:00:00 40 mg via INTRAVENOUS
  Filled 2016-01-20: qty 4

## 2016-01-20 MED ORDER — INSULIN GLARGINE 100 UNIT/ML ~~LOC~~ SOLN
28.0000 [IU] | Freq: Every day | SUBCUTANEOUS | Status: DC
  Administered 2016-01-20 – 2016-01-21 (×2): 28 [IU] via SUBCUTANEOUS
  Filled 2016-01-20 (×2): qty 0.28

## 2016-01-20 MED ORDER — FUROSEMIDE 40 MG PO TABS
40.0000 mg | ORAL_TABLET | Freq: Once | ORAL | Status: AC
  Administered 2016-01-20: 20:00:00 40 mg via ORAL
  Filled 2016-01-20: qty 1

## 2016-01-20 MED ORDER — METHYLPREDNISOLONE SODIUM SUCC 125 MG IJ SOLR
60.0000 mg | Freq: Three times a day (TID) | INTRAMUSCULAR | Status: DC
  Administered 2016-01-20 – 2016-01-21 (×4): 60 mg via INTRAVENOUS
  Filled 2016-01-20 (×4): qty 2

## 2016-01-20 MED ORDER — DILTIAZEM HCL ER COATED BEADS 120 MG PO CP24
120.0000 mg | ORAL_CAPSULE | Freq: Every day | ORAL | Status: DC
  Administered 2016-01-20: 120 mg via ORAL
  Filled 2016-01-20 (×2): qty 1

## 2016-01-20 MED ORDER — HYDRALAZINE HCL 50 MG PO TABS
100.0000 mg | ORAL_TABLET | Freq: Three times a day (TID) | ORAL | Status: DC
  Administered 2016-01-20 – 2016-01-21 (×3): 100 mg via ORAL
  Filled 2016-01-20 (×3): qty 2

## 2016-01-20 NOTE — Progress Notes (Signed)
CARDIAC REHAB PHASE I   PRE:  Rate/Rhythm: 83 SR    BP: sitting 176/68    SaO2:   MODE:  Ambulation: 300 ft   POST:  Rate/Rhythm: 104 ST with coughing, 90s walking    BP: sitting 203/90     SaO2:   Pt with wheezing at rest. Able to slowly walk 300 ft however SOB and wheezing. Coughing toward end of walk, she was able to produce small amount of phlegm.  BP significantly elevated after short, slow walk. Encouraged more walking today, paying attention to her breathing. Reviewed ed. [place], ACSM 01/20/2016 8:42 AM

## 2016-01-20 NOTE — Progress Notes (Signed)
Patient Name: [name] Date of Encounter: 01/20/2016  Hospital Problem List     Principal Problem:   Acute bronchitis with asthma with acute exacerbation Active Problems:   Diabetes mellitus type 2, uncontrolled ([facility])   Essential hypertension   G E R D   PULMONARY EMBOLISM, HX OF   Hyperlipidemia   Chest pain   Acute hypokalemia   Hypomagnesemia   Acute respiratory failure (HCC)   NSTEMI (non-ST elevated myocardial infarction) (HCC)    Subjective   Still wheezing quite a bit. Breathing tx help for about an hour only. No chest pain.  Inpatient Medications    . albuterol  2.5 mg Nebulization TID  . aspirin EC  81 mg Oral q morning - 10a  . atorvastatin  40 mg Oral q morning - 10a  . azithromycin  500 mg Oral Daily  . diltiazem  120 mg Oral Daily  . estradiol  1 mg Oral q morning - 10a  . hydrALAZINE  50 mg Oral Q8H  . insulin aspart  0-15 Units Subcutaneous TID WC  . insulin aspart  0-5 Units Subcutaneous QHS  . insulin glargine  20 Units Subcutaneous Daily  . irbesartan  150 mg Oral Daily  . methylPREDNISolone (SOLU-MEDROL) injection  60 mg Intravenous Q12H  . mometasone-formoterol  2 puff Inhalation BID  . montelukast  10 mg Oral QHS  . pantoprazole  40 mg Oral Daily  . sodium chloride flush  3 mL Intravenous Q12H  . sodium chloride flush  3 mL Intravenous Q12H  . ticagrelor  90 mg Oral Q12H    Vital Signs    Vitals:   01/19/16 2000 01/20/16 0320 01/20/16 0420 01/20/16 0525  BP:  (!) 187/67 (!) 173/82 (!) 152/57  Pulse:  73    Resp: 18 (!) 21 18 12   Temp:  97.9 F (36.6 C)    TempSrc:  Oral    SpO2:  100% 100% 99%  Weight:  160 lb 4.4 oz (72.7 kg)    Height:        Intake/Output Summary (Last 24 hours) at 01/20/16 0729 Last data filed at 01/19/16 2100  Gross per 24 hour  Intake              360 ml  Output              800 ml  Net             -440 ml   Filed Weights   01/19/16 0250 01/20/16 0320  Weight: 167 lb 8.8 oz (76 kg) 160 lb 4.4 oz  (72.7 kg)    Physical Exam    General: Well developed, well nourished AAF, in no acute distress HEENT: Normocephalic, atraumatic, sclera non-icteric, no xanthomas, nares are without discharge. Neck: Negative for carotid bruits. JVP not elevated. Lungs: Diffuse wheezing in all lung fields, no rales or rhonchi. Breathing is unlabored. Cardiac: RRR S1 S2 without murmurs, rubs, or gallops.  Abdomen: Soft, non-tender, non-distended with normoactive bowel sounds. No rebound/guarding. Extremities: No clubbing or cyanosis. No edema. Distal pedal pulses are 2+ and equal bilaterally. Skin: Warm and dry, no significant rash. Right radial cath site without hematoma or ecchymosis; good pulse. Neuro: Alert and oriented X 3. Strength and sensation in tact. Psych:  Responds to questions appropriately with a normal affect.  Labs    CBC  Recent Labs  01/19/16 0313  WBC 17.0*  HGB 12.0  HCT 37.8  MCV 94.0  PLT  123456   Basic Metabolic Panel  Recent Labs  01/18/16 0502 01/19/16 0313  NA 137 135  K 4.0 4.0  CL 104 104  CO2 24 21*  GLUCOSE 228* 178*  BUN 29* 29*  CREATININE 1.08* 0.94  CALCIUM 9.0 8.8*   Liver Function Tests No results for input(s): AST, ALT, ALKPHOS, BILITOT, PROT, ALBUMIN in the last 72 hours. No results for input(s): LIPASE, AMYLASE in the last 72 hours. Cardiac Enzymes No results for input(s): CKTOTAL, CKMB, CKMBINDEX, TROPONINI in the last 72 hours. BNP Invalid input(s): POCBNP D-Dimer No results for input(s): DDIMER in the last 72 hours. Hemoglobin A1C No results for input(s): HGBA1C in the last 72 hours. Fasting Lipid Panel No results for input(s): CHOL, HDL, LDLCALC, TRIG, CHOLHDL, LDLDIRECT in the last 72 hours. Thyroid Function Tests No results for input(s): TSH, T4TOTAL, T3FREE, THYROIDAB in the last 72 hours.  Invalid input(s): [place]  Telemetry    NSR  Radiology    Dg Chest Portable 1 View  Result Date: 01/16/2016 CLINICAL DATA:  Shortness of  breath, wheezing, cough and central chest tightness for 4 days, history asthma, hypertension, diabetes mellitus, former smoker EXAM: PORTABLE CHEST 1 VIEW COMPARISON:  Portable exam 1052 hours compared to 07/24/2015 FINDINGS: Mild enlargement of cardiac silhouette. Mediastinal contours and pulmonary vascularity normal. Elongation of thoracic aorta again noted. Lungs clear. No pleural effusion or pneumothorax. Bones unremarkable. IMPRESSION: No acute abnormalities. Mild enlargement of cardiac silhouette. Electronically Signed   By: Lavonia Dana M.D.   On: 01/16/2016 11:14     Patient Profile     34F with history of asthma (all her life), DM, HLD, HTN, GERD, prior PE admitted with acute on chronic asthma exacerbation as well as NSTEMI. LHC 01/18/16: s/p overlapping DESx2 to RCA, 10% ost-prox Cx,10% mLAD. 2D Echo 01/18/16: severe LVH, EF 65-70% with grade 2 DD, high vent filling pressure, mild MR.  Assessment & Plan    1. Acute on chronic asthma exacerbation with +rhinovirus - per IM. Still with significant wheezing on exam.  2. NSTEMI/CAD diagnosed this admission - continue aspirin, Brilinta.  I wrote 30-day free RX and put on shadow chart. Have added on baseline LFTs to AM labs - if they are wnl, would recommend to increase atorvastatin to 80mg  qpm at discharge. No beta blocker secondary to severe wheezing.  3. Acute on suspected chronic diastolic CHF/hypertensive heart disease - -7lb since prior weight, unclear if accurate. At this point majority of issue seems related to asthma. Will f/u BMET today given cath and recent IV Lasix use. Recommend to control BP. See below.  4. Hypertension - BP still running high, might be exacerbated by IV steroids. On Diltiazem 60mg  SR only once daily at home. Will change to Diltiazem CD 120mg  daily.  I have sent a message to our [facility] office's scheduler requesting a follow-up appointment, and our office will call the patient with this  information.  Signed, Charlie Pitter, PA-C  01/20/2016, 7:29 AM   Personally seen and examined. Agree with above.  2 RCA stents More IV lasix, LE edema noted, compression BP should decrease with IV lasix Agree with changes above.  Candee Furbish, MD

## 2016-01-20 NOTE — Research (Signed)
[id number] GENETIC Testing and HIPPA Authorization Informed Consent   Subject Name: [name]  Subject met inclusion and exclusion criteria.  The informed consent form, study requirements and expectations were reviewed with the subject and questions and concerns were addressed prior to the signing of the consent form.  The subject verbalized understanding of the trial requirements.  The subject agreed to participate in the trial and signed the informed consent.  The informed consent was obtained prior to performance of any protocol-specific procedures for the subject.  A copy of the signed informed consent was given to the subject and a copy was placed in the subject's medical record.  Blossom Hoops 01/20/2016, 9:03 AM

## 2016-01-20 NOTE — Progress Notes (Signed)
PROGRESS NOTE                                                                                                                                                                                                             Patient Demographics:    [name], is a 65 y.o. female, DOB - 30-Sep-1950, [id number]  Admit date - 01/16/2016   Admitting Physician Allie Bossier, MD  Outpatient Primary MD for the patient is [name], MD  LOS - 4  Chief Complaint  Patient presents with  . Asthma       Brief Narrative    [name] is a 65 y.o. female with medical history significant for hypertension, diabetes on metformin, history of prior PE and asthma. This past Thursday patient began having upper respiratory infection symptoms with cough and congestion with productive yellow sputum. Because of this she developed asthma exacerbation symptoms which are typical for her with increased wheezing and tightness. She is not had fevers or chills. She's not had any GI symptoms such as nausea, vomiting or diarrhea. She went to her PCP who prescribed prednisone. In addition she utilized her normal nebulizers and MDIs without improvement in her symptoms. Because of increased respiratory distress she presented to the ER today. No documentation from ER to determine if patient was hypoxemic at presentation. She was wheezing significantly upon arrival and becoming dyspneic with talking. She was also reporting pleuritic chest tightness with taking a deep breath. She has been exposed to multiple sick contacts with similar symptoms prior to onset of her symptoms.   Subjective:    [name] today has, No headache, No chest pain, No abdominal pain - No Nausea, No new weakness tingling or numbness, + Cough & SOB.     Assessment  & Plan :      1. Acute on chronic asthma exacerbation. She still wheezing, continue IV steroids, added Singulair,  added nebulizer treatments both scheduled and as needed, will add Dulera as well and monitor. Respiratory panel positive for coronavirus likely URI exacerbated her asthma +  dCHF, SOB better.  2. Hypertension. Hydralazine dose doubled, ARB + Cardizem.  3. History of PE. Normal d-dimer.  4. Hypokalemia. Replaced.  5. GERD. On PPI.  6.NSTEMI - pain free, seen by Cards post left heart cath on 01/18/2016 - Severe single  vessel CAD with severe stenosis in the mid and distal RCA with successful PTCA/DES x 2 mid and distal RCA, now on Dual anti-platelet therapy with ASA and Brilinta for one year. Continue statin & Cardizem, no B.Blocker due to Asthma, pain free.  7.Mild acute on Chr D CHF Ef 60% - due to excess fluid, IV lasix again and monitor.  8. DM2 - ISS and add Lantus for better control while on steroids.  CBG (last 3)   Recent Labs  01/19/16 1715 01/19/16 2104 01/20/16 0623  GLUCAP 251* 201* 172*      Family Communication  :  none  Code Status :  Full  Diet : heart-Low carb  Disposition Plan  :  Home 1-2 days  Consults  :  Cards  Procedures  :    L.Heart Cath -    Mid RCA lesion, 70 %stenosed.  Dist RCA lesion, 90 %stenosed.  A STENT SYNERGY DES [id number] drug eluting stent was successfully placed, and overlaps the 2.5 x 38 mm Synergy stent placed distally.  Prox RCA lesion, 80 %stenosed.  Post intervention, there is a 0% residual stenosis.  Ost Cx to Prox Cx lesion, 10 %stenosed.  Mid LAD lesion, 10 %stenosed.   1. NSTEMI 2. Severe single vessel CAD with severe stenosis in the mid and distal RCA 3. Successful PTCA/DES x 2 mid and distal RCA  Recommendations: Dual anti-platelet therapy with ASA and Brilinta for one year. Continue statin.     TTE -   Left ventricle: The cavity size was normal. Septal wall thickness was increased in a pattern of severe LVH with mild hypertrophy of the posterior wall. There was severe concentric hypertrophy. Systolic  function was vigorous. The estimated ejection fraction was in the range of 65% to 70%. Wall motion was normal; there were no regional wall motion abnormalities. Features are consistent with a pseudonormal left ventricular filling pattern, with concomitant abnormal relaxation and increased filling pressure (grade 2 diastolic dysfunction). Doppler parameters are consistent with high ventricular filling pressure. - Aortic valve: Transvalvular velocity was within the normal range. There was no stenosis. There was no regurgitation. - Mitral valve: Transvalvular velocity was within the normal range. There was no evidence for stenosis. There was mild regurgitation. - Right ventricle: The cavity size was normal. Wall thickness was normal. Systolic function was normal. - Tricuspid valve: There was trivial regurgitation. - Pulmonary arteries: Systolic pressure was within the normal range.   DVT Prophylaxis  :  Lovenox   Lab Results  Component Value Date   PLT 370 01/20/2016    Inpatient Medications  Scheduled Meds: . albuterol  2.5 mg Nebulization TID  . aspirin EC  81 mg Oral q morning - 10a  . atorvastatin  40 mg Oral q morning - 10a  . azithromycin  500 mg Oral Daily  . diltiazem  120 mg Oral Daily  . estradiol  1 mg Oral q morning - 10a  . hydrALAZINE  50 mg Oral Q8H  . insulin aspart  0-15 Units Subcutaneous TID WC  . insulin aspart  0-5 Units Subcutaneous QHS  . insulin glargine  28 Units Subcutaneous Daily  . irbesartan  150 mg Oral Daily  . methylPREDNISolone (SOLU-MEDROL) injection  60 mg Intravenous TID  . mometasone-formoterol  2 puff Inhalation BID  . montelukast  10 mg Oral QHS  . pantoprazole  40 mg Oral Daily  . sodium chloride flush  3 mL Intravenous Q12H  . sodium chloride flush  3  mL Intravenous Q12H  . ticagrelor  90 mg Oral Q12H   Continuous Infusions:   PRN Meds:.sodium chloride, acetaminophen **OR** acetaminophen, ALPRAZolam, chlorpheniramine-HYDROcodone,  famotidine, fluticasone, ipratropium-albuterol, nitroGLYCERIN, ondansetron **OR** ondansetron (ZOFRAN) IV, sodium chloride flush  Antibiotics  :    Anti-infectives    Start     Dose/Rate Route Frequency Ordered Stop   01/17/16 1000  azithromycin (ZITHROMAX) tablet 500 mg     500 mg Oral Daily 01/17/16 0822 01/22/16 0959         Objective:   Vitals:   01/20/16 0320 01/20/16 0420 01/20/16 0525 01/20/16 0740  BP: (!) 187/67 (!) 173/82 (!) 152/57 (!) 176/68  Pulse: 73   91  Resp: (!) 21 18 12 18   Temp: 97.9 F (36.6 C)   98.6 F (37 C)  TempSrc: Oral   Oral  SpO2: 100% 100% 99% 99%  Weight: 72.7 kg (160 lb 4.4 oz)     Height:        Wt Readings from Last 3 Encounters:  01/20/16 72.7 kg (160 lb 4.4 oz)  08/12/15 75.8 kg (167 lb)  07/21/15 74.3 kg (163 lb 12.8 oz)     Intake/Output Summary (Last 24 hours) at 01/20/16 0854 Last data filed at 01/20/16 0747  Gross per 24 hour  Intake              600 ml  Output              800 ml  Net             -200 ml     Physical Exam  Awake Alert, Oriented X 3, No new F.N deficits, Normal affect [place].AT,PERRAL Supple Neck,No JVD, No cervical lymphadenopathy appriciated.  Symmetrical Chest wall movement, Good air movement bilaterally, few rales n wheezes RRR,No Gallops,Rubs or new Murmurs, No Parasternal Heave +ve B.Sounds, Abd Soft, No tenderness, No organomegaly appriciated, No rebound - guarding or rigidity. No Cyanosis, Clubbing or edema, No new Rash or bruise      Data Review:    CBC  Recent Labs Lab 01/16/16 1010 01/17/16 0158 01/19/16 0313 01/20/16 0709  WBC 11.7* 12.6* 17.0* 16.2*  HGB 13.0 11.3* 12.0 12.5  HCT 39.5 34.3* 37.8 37.8  PLT 353 277 364 370  MCV 92.7 93.0 94.0 91.7  MCH 30.5 30.6 29.9 30.3  MCHC 32.9 32.9 31.7 33.1  RDW 13.5 13.9 14.3 14.2  LYMPHSABS 1.2  --   --   --   MONOABS 0.5  --   --   --   EOSABS 0.0  --   --   --   BASOSABS 0.0  --   --   --     Chemistries   Recent Labs Lab  01/16/16 1051 01/17/16 0158 01/18/16 0411 01/18/16 0502 01/19/16 0313 01/20/16 0709  NA  --  134* 138 137 135 134*  K  --  3.1* 4.2 4.0 4.0 4.3  CL  --  100* 105 104 104 98*  CO2  --  24 27 24  21* 26  GLUCOSE  --  276* 234* 228* 178* 170*  BUN  --  26* 29* 29* 29* 26*  CREATININE  --  1.21* 1.12* 1.08* 0.94 0.98  CALCIUM  --  8.6* 8.9 9.0 8.8* 8.7*  MG 1.4* 1.9  --   --   --   --   AST  --   --   --   --   --  20  ALT  --   --   --   --   --  26  ALKPHOS  --   --   --   --   --  52  BILITOT  --   --   --   --   --  1.0   ------------------------------------------------------------------------------------------------------------------ No results for input(s): CHOL, HDL, LDLCALC, TRIG, CHOLHDL, LDLDIRECT in the last 72 hours.  Lab Results  Component Value Date   HGBA1C 7.0 (H) 01/16/2016   ------------------------------------------------------------------------------------------------------------------ No results for input(s): TSH, T4TOTAL, T3FREE, THYROIDAB in the last 72 hours.  Invalid input(s): FREET3 ------------------------------------------------------------------------------------------------------------------ No results for input(s): VITAMINB12, FOLATE, FERRITIN, TIBC, IRON, RETICCTPCT in the last 72 hours.  Coagulation profile  Recent Labs Lab 01/18/16 0502  INR 1.03    No results for input(s): DDIMER in the last 72 hours.  Cardiac Enzymes  Recent Labs Lab 01/16/16 1505 01/16/16 1925 01/17/16 0158  TROPONINI 0.05* 0.12* 0.29*   ------------------------------------------------------------------------------------------------------------------    Component Value Date/Time   BNP 35.5 01/16/2016 1010    Micro Results Recent Results (from the past 240 hour(s))  Respiratory Panel by PCR     Status: Abnormal   Collection Time: 01/16/16 12:30 PM  Result Value Ref Range Status   Adenovirus NOT DETECTED NOT DETECTED Final   Coronavirus 229E NOT DETECTED  NOT DETECTED Final   Coronavirus HKU1 NOT DETECTED NOT DETECTED Final   Coronavirus NL63 NOT DETECTED NOT DETECTED Final   Coronavirus OC43 NOT DETECTED NOT DETECTED Final   Metapneumovirus NOT DETECTED NOT DETECTED Final   Rhinovirus / Enterovirus DETECTED (A) NOT DETECTED Final   Influenza A NOT DETECTED NOT DETECTED Final   Influenza B NOT DETECTED NOT DETECTED Final   Parainfluenza Virus 1 NOT DETECTED NOT DETECTED Final   Parainfluenza Virus 2 NOT DETECTED NOT DETECTED Final   Parainfluenza Virus 3 NOT DETECTED NOT DETECTED Final   Parainfluenza Virus 4 NOT DETECTED NOT DETECTED Final   Respiratory Syncytial Virus NOT DETECTED NOT DETECTED Final   Bordetella pertussis NOT DETECTED NOT DETECTED Final   Chlamydophila pneumoniae NOT DETECTED NOT DETECTED Final   Mycoplasma pneumoniae NOT DETECTED NOT DETECTED Final    Radiology Reports Dg Chest Portable 1 View  Result Date: 01/16/2016 CLINICAL DATA:  Shortness of breath, wheezing, cough and central chest tightness for 4 days, history asthma, hypertension, diabetes mellitus, former smoker EXAM: PORTABLE CHEST 1 VIEW COMPARISON:  Portable exam 1052 hours compared to 07/24/2015 FINDINGS: Mild enlargement of cardiac silhouette. Mediastinal contours and pulmonary vascularity normal. Elongation of thoracic aorta again noted. Lungs clear. No pleural effusion or pneumothorax. Bones unremarkable. IMPRESSION: No acute abnormalities. Mild enlargement of cardiac silhouette. Electronically Signed   By: Lavonia Dana M.D.   On: 01/16/2016 11:14    Time Spent in minutes  30   Undra Harriman K M.D on 01/20/2016 at 8:54 AM  Between 7am to 7pm - Pager - [phone]  After 7pm go to [email or web address] - password [facility]  [facility] -  Office  [phone]

## 2016-01-20 NOTE — Progress Notes (Signed)
Pt with an order to transfer to 3W28. Report given to [facility]. V/S stable. No c/o any chest pain or discomfort. Transferred without difficulty @ 20:15.

## 2016-01-21 DIAGNOSIS — R0789 Other chest pain: Secondary | ICD-10-CM

## 2016-01-21 DIAGNOSIS — I2699 Other pulmonary embolism without acute cor pulmonale: Secondary | ICD-10-CM

## 2016-01-21 DIAGNOSIS — J9601 Acute respiratory failure with hypoxia: Secondary | ICD-10-CM

## 2016-01-21 DIAGNOSIS — E118 Type 2 diabetes mellitus with unspecified complications: Secondary | ICD-10-CM

## 2016-01-21 LAB — LIPID PANEL
CHOL/HDL RATIO: 2.7 ratio
CHOLESTEROL: 207 mg/dL — AB (ref 0–200)
HDL: 77 mg/dL (ref >40)
LDL Cholesterol: 110 mg/dL — ABNORMAL HIGH (ref 0–99)
Triglycerides: 102 mg/dL (ref <150)
VLDL: 20 mg/dL (ref 0–40)

## 2016-01-21 LAB — MAGNESIUM: MAGNESIUM: 2.4 mg/dL (ref 1.7–2.4)

## 2016-01-21 LAB — POTASSIUM: Potassium: 4.5 mmol/L (ref 3.5–5.1)

## 2016-01-21 LAB — GLUCOSE, CAPILLARY: Glucose-Capillary: 222 mg/dL — ABNORMAL HIGH (ref 65–99)

## 2016-01-21 MED ORDER — ATORVASTATIN CALCIUM 80 MG PO TABS
80.0000 mg | ORAL_TABLET | Freq: Every morning | ORAL | Status: DC
  Administered 2016-01-21: 80 mg via ORAL
  Filled 2016-01-21: qty 1

## 2016-01-21 MED ORDER — PREDNISONE 5 MG PO TABS: ORAL_TABLET | ORAL | 0 refills | Status: DC

## 2016-01-21 MED ORDER — TICAGRELOR 90 MG PO TABS: 90.0000 mg | ORAL_TABLET | Freq: Two times a day (BID) | ORAL | 3 refills | Status: DC

## 2016-01-21 MED ORDER — DILTIAZEM HCL ER COATED BEADS 180 MG PO CP24
180.0000 mg | ORAL_CAPSULE | Freq: Every day | ORAL | Status: DC
  Administered 2016-01-21: 180 mg via ORAL
  Filled 2016-01-21: qty 1

## 2016-01-21 MED ORDER — ATORVASTATIN CALCIUM 80 MG PO TABS: 80.0000 mg | ORAL_TABLET | Freq: Every morning | ORAL | 0 refills | Status: DC

## 2016-01-21 MED ORDER — HYDRALAZINE HCL 100 MG PO TABS: 100.0000 mg | ORAL_TABLET | Freq: Three times a day (TID) | ORAL | 0 refills | Status: DC

## 2016-01-21 MED ORDER — DILTIAZEM HCL ER COATED BEADS 180 MG PO CP24
180.0000 mg | ORAL_CAPSULE | Freq: Every day | ORAL | 0 refills | Status: DC
Start: 2016-01-22 — End: 2016-02-23

## 2016-01-21 NOTE — Discharge Summary (Addendum)
[name] [id number] DOB: 1950-10-28 DOA: 01/16/2016  PCP: Jacqueline Frees, MD  Admit date: 01/16/2016  Discharge date: 01/21/2016  Admitted From: [facility]   Disposition:  Home   Recommendations for Outpatient Follow-up:   Follow up with PCP in 1-2 weeks  PCP Please obtain BMP/CBC, 2 view CXR in 1week,  (see Discharge instructions)   PCP Please follow up on the following pending results: None   Home Health: None   Equipment/Devices: None  Consultations: Cards Discharge Condition: Stable   CODE STATUS: Full   Diet Recommendation: Heart Healthy Low Carb   Chief Complaint  Patient presents with  . Asthma     Brief history of present illness from the day of admission and additional interim summary    [name] a 65 y.o.femalewith medical history significant for hypertension, diabetes on metformin, history of prior PE and asthma. This past Thursday patient began having upper respiratory infection symptoms with cough and congestion with productive yellow sputum. Because of this she developed asthma exacerbation symptoms which are typical for her with increased wheezing and tightness. She is not had fevers or chills. She's not had any GI symptoms such as nausea, vomiting or diarrhea. She went to her PCP who prescribed prednisone. In addition she utilized her normal nebulizers and MDIs without improvement in her symptoms. Because of increased respiratory distress she presented to the ER today. No documentation from ER to determine if patient was hypoxemic at presentation. She was wheezing significantly upon arrival and becoming dyspneic with talking. She was also reporting pleuritic chest tightness with taking a deep breath.   She has been exposed to multiple sick contacts with similar symptoms prior to  onset of her symptoms.She was positive for coronavirus which likely exacerbated her asthma, also had NSTEMI requiring left heart cath and stent placement.   Hospital issues addressed     1. Acute on chronic asthma exacerbation. She still wheezing, continue IV steroids, added Singulair, added nebulizer treatments both scheduled and as needed, will add Dulera as well and monitor. Respiratory panel positive for coronavirus likely URI exacerbated her asthma +  dCHF, SOB better, she has no oxygen requirement for the last 2 days, she still has minimal wheezing but this is mostly upper airway coming from her throat, will be placed on steroid taper, continue home nebulizer treatments and follow with Dr. Melvyn Novas her pulmonary physician in a week.  2. Hypertension. Hydralazine dose doubled, ARB + Cardizem. Follow with PCP for blood pressure control.  3. History of PE. Normal d-dimer.  4. Hypokalemia. Replaced and stable.  5. GERD. On PPI.  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 Continue statin & Cardizem, no B.Blocker due to Asthma, pain free. All with cardiology outpatient.  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  8. DM2 - continue Home rx.   Discharge diagnosis     Principal Problem:   Acute bronchitis with asthma with acute exacerbation Active Problems:   Diabetes mellitus type 2, uncontrolled (HCC)   Essential hypertension   G E R D   PULMONARY EMBOLISM, HX OF   Hyperlipidemia   Chest pain   Acute hypokalemia   Hypomagnesemia   Acute respiratory failure (HCC)   NSTEMI (non-ST elevated myocardial infarction) (HCC)   CAD in native artery   Hypertensive heart disease    Discharge instructions    Discharge  Instructions    Amb Referral to Cardiac Rehabilitation    Complete by:  As directed    Diagnosis:   Coronary Stents NSTEMI     Discharge instructions    Complete by:  As directed    Follow with Primary MD Jacqueline Frees, MD in 7 days   Get CBC, CMP, 2 view Chest X ray checked  by Primary MD or SNF MD in 5-7 days ( we routinely change or add medications that can affect your baseline labs and fluid status, therefore we recommend that you get the mentioned basic workup next visit with your PCP, your PCP may decide not to get them or add new tests based on their clinical decision)   Activity: As tolerated with Full fall precautions use walker/cane & assistance as needed   Disposition Home     Diet:   Heart Healthy Low carb.  For Heart failure patients - Check your Weight same time everyday, if you gain over 2 pounds, or you develop in leg swelling, experience more shortness of breath or chest pain, call your Primary MD immediately. Follow Cardiac Low Salt Diet and 1.5 lit/day fluid restriction.   On your next visit with your primary care physician please Get Medicines reviewed and adjusted.   Please request your Prim.MD to go over all Hospital Tests and Procedure/Radiological results at the follow up, please get all Hospital records sent to your Prim MD by signing hospital release before you go home.   If you experience worsening of your admission symptoms, develop shortness of breath, life threatening emergency, suicidal or homicidal thoughts you must seek medical attention immediately by calling 911 or calling your MD immediately  if symptoms less severe.  You Must read complete instructions/literature along with all the possible adverse reactions/side effects for all the Medicines you take and that have been prescribed to you. Take any new Medicines after you have completely understood and accpet all the possible adverse reactions/side effects.   Do not drive, operate heavy  machinery, perform activities at heights, swimming or participation in water activities or provide baby sitting services if your were admitted for syncope or siezures until you have seen by Primary MD or a Neurologist and advised to do so again.  Do not drive when taking Pain medications.    Do not take more than prescribed Pain, Sleep and Anxiety Medications  Special Instructions: If you have smoked or chewed Tobacco  in the last 2 yrs please stop smoking, stop any regular Alcohol  and or any Recreational drug use.  Wear Seat belts while driving.   Please note  You were cared for by a hospitalist during your hospital stay. If you have any questions about your discharge medications or the care you received while you were in the hospital after you are discharged, you can call the unit and asked to speak  with the hospitalist on call if the hospitalist that took care of you is not available. Once you are discharged, your primary care physician will handle any further medical issues. Please note that NO REFILLS for any discharge medications will be authorized once you are discharged, as it is imperative that you return to your primary care physician (or establish a relationship with a primary care physician if you do not have one) for your aftercare needs so that they can reassess your need for medications and monitor your lab values.                                                       [name] was admitted to the Hospital on 01/16/2016 and Discharged  01/21/2016 and should be excused from work/school   for 7 days starting from date -  01/16/2016 , may return to work/school without any restrictions.  Call Jacqueline Lund MD, [facility]  [phone] with questions.  [name] M.D on 01/21/2016,at 8:50 AM  [facility]   Office  [phone]   Increase activity slowly    Complete by:  As directed       Discharge Medications     Medication List    STOP taking  these medications   diltiazem 60 MG 12 hr capsule Commonly known as:  CARDIZEM SR   predniSONE 20 MG tablet Commonly known as:  DELTASONE Replaced by:  predniSONE 5 MG tablet     TAKE these medications   albuterol (2.5 MG/3ML) 0.083% nebulizer solution Commonly known as:  PROVENTIL Take 3 mLs (2.5 mg total) by nebulization every 4 (four) hours as needed for shortness of breath.   albuterol 108 (90 Base) MCG/ACT inhaler Commonly known as:  PROVENTIL HFA;VENTOLIN HFA Inhale 2 puffs into the lungs every 6 (six) hours as needed for wheezing or shortness of breath.   ALPRAZolam 0.5 MG tablet Commonly known as:  XANAX Take 1 tablet (0.5 mg total) by mouth 2 (two) times daily as needed for anxiety.   aspirin EC 81 MG tablet Take 81 mg by mouth every morning.   atorvastatin 80 MG tablet Commonly known as:  LIPITOR Take 1 tablet (80 mg total) by mouth every morning. Start taking on:  01/22/2016 What changed:  medication strength  how much to take   calcium carbonate 750 MG chewable tablet Commonly known as:  TUMS EX Chew 2 tablets by mouth at bedtime as needed for heartburn.   diltiazem 180 MG 24 hr capsule Commonly known as:  CARDIZEM CD Take 1 capsule (180 mg total) by mouth daily. Start taking on:  01/22/2016   esomeprazole 40 MG capsule Commonly known as:  NEXIUM Take 40 mg by mouth every morning.   estradiol 1 MG tablet Commonly known as:  ESTRACE Take 1 mg by mouth every morning.   fluticasone 50 MCG/ACT nasal spray Commonly known as:  FLONASE Place 2 sprays into both nostrils 2 (two) times daily as needed for allergies or rhinitis.   hydrALAZINE 100 MG tablet Commonly known as:  APRESOLINE Take 1 tablet (100 mg total) by mouth every 8 (eight) hours.   metFORMIN 500 MG tablet Commonly known as:  GLUCOPHAGE Take 500 mg by mouth daily with breakfast.   potassium chloride SA 20 MEQ tablet Commonly known as:  K-DUR,KLOR-CON Take 20 mEq  by mouth every  morning.   predniSONE 5 MG tablet Commonly known as:  DELTASONE Label  & dispense according to the schedule below. 10 Pills PO for 3 days then, 8 Pills PO for 3 days, 6 Pills PO for 3 days, 4 Pills PO for 3 days, 2 Pills PO for 3 days, 1 Pills PO for 3 days, 1/2 Pill  PO for 3 days then STOP. Total 95 pills. Replaces:  predniSONE 20 MG tablet   SYMBICORT 160-4.5 MCG/ACT inhaler Generic drug:  budesonide-formoterol Inhale 2 puffs into the lungs 2 (two) times daily.   ticagrelor 90 MG Tabs tablet Commonly known as:  BRILINTA Take 1 tablet (90 mg total) by mouth every 12 (twelve) hours.   valsartan-hydrochlorothiazide 320-25 MG tablet Commonly known as:  DIOVAN-HCT Take 1 tablet by mouth daily.       Follow-up Information    [facility] .   Specialty:  Cardiology Why:  The office will call you for your follow-up appointment. If you have not heard back within 1 day of discharge, please call our office. Contact information: [address] [place]       Jacqueline Frees, MD. Schedule an appointment as soon as possible for a visit in 1 week(s).   Specialty:  Family Medicine Contact information: [place] [address] [phone]        Jacqueline Gully, MD. Schedule an appointment as soon as possible for a visit in 1 week(s).   Specialty:  Pulmonary Disease Contact information: [address] [place] [address] [phone]           Major procedures and Radiology Reports - PLEASE review detailed and final reports thoroughly  -      L.Heart Cath -    Mid RCA lesion, 70 %stenosed.  Dist RCA lesion, 90 %stenosed.  A STENT SYNERGY DES [id number] drug eluting stent was successfully placed, and overlaps the 2.5 x 38 mm Synergy stent placed distally.  Prox RCA lesion, 80 %stenosed.  Post intervention, there is a 0% residual stenosis.  Ost Cx to Prox Cx  lesion, 10 %stenosed.  Mid LAD lesion, 10 %stenosed.  1. NSTEMI 2. Severe single vessel CAD with severe stenosis in the mid and distal RCA 3. Successful PTCA/DES x 2 mid and distal RCA  Recommendations: Dual anti-platelet therapy with ASA and Brilinta for one year. Continue statin.     TTE -   Left ventricle: The cavity size was normal. Septal wall thicknesswas increased in a pattern of severe LVH with mild hypertrophy ofthe posterior wall. There was severe concentric hypertrophy.Systolic function was vigorous. The estimated ejection fractionwas in the range of 65% to 70%. Wall motion was normal; there were no regional wall motion abnormalities. Features areconsistent with a pseudonormal left ventricular filling pattern,with concomitant abnormal relaxation and increased filling pressure (grade 2 diastolic dysfunction). Doppler parameters are consistent with high ventricular filling pressure. - Aortic valve: Transvalvular velocity was within the normal range. There was no stenosis. There was no regurgitation. - Mitral valve: Transvalvular velocity was within the normal range. There was no evidence for stenosis. There was mild regurgitation. - Right ventricle: The cavity size was normal. Wall thickness was normal. Systolic function was normal. - Tricuspid valve: There was trivial regurgitation. - Pulmonary arteries: Systolic pressure was within the normal range.    Dg Chest Portable 1 View  Result Date: 01/16/2016 CLINICAL DATA:  Shortness of  breath, wheezing, cough and central chest tightness for 4 days, history asthma, hypertension, diabetes mellitus, former smoker EXAM: PORTABLE CHEST 1 VIEW COMPARISON:  Portable exam 1052 hours compared to 07/24/2015 FINDINGS: Mild enlargement of cardiac silhouette. Mediastinal contours and pulmonary vascularity normal. Elongation of thoracic aorta again noted. Lungs clear. No pleural effusion or pneumothorax. Bones unremarkable. IMPRESSION: No  acute abnormalities. Mild enlargement of cardiac silhouette. Electronically Signed   By: Lavonia Dana M.D.   On: 01/16/2016 11:14    Micro Results     Recent Results (from the past 240 hour(s))  Respiratory Panel by PCR     Status: Abnormal   Collection Time: 01/16/16 12:30 PM  Result Value Ref Range Status   Adenovirus NOT DETECTED NOT DETECTED Final   Coronavirus 229E NOT DETECTED NOT DETECTED Final   Coronavirus HKU1 NOT DETECTED NOT DETECTED Final   Coronavirus NL63 NOT DETECTED NOT DETECTED Final   Coronavirus OC43 NOT DETECTED NOT DETECTED Final   Metapneumovirus NOT DETECTED NOT DETECTED Final   Rhinovirus / Enterovirus DETECTED (A) NOT DETECTED Final   Influenza A NOT DETECTED NOT DETECTED Final   Influenza B NOT DETECTED NOT DETECTED Final   Parainfluenza Virus 1 NOT DETECTED NOT DETECTED Final   Parainfluenza Virus 2 NOT DETECTED NOT DETECTED Final   Parainfluenza Virus 3 NOT DETECTED NOT DETECTED Final   Parainfluenza Virus 4 NOT DETECTED NOT DETECTED Final   Respiratory Syncytial Virus NOT DETECTED NOT DETECTED Final   Bordetella pertussis NOT DETECTED NOT DETECTED Final   Chlamydophila pneumoniae NOT DETECTED NOT DETECTED Final   Mycoplasma pneumoniae NOT DETECTED NOT DETECTED Final    Today   Subjective    [name] today has no headache,no chest abdominal pain,no new weakness tingling or numbness, feels much better wants to go home today.     Objective   Blood pressure 170/70, pulse 75, temperature 98.1 F (36.7 C), temperature source Oral, resp. rate 16, height 5\' 2"  (1.575 m), weight 77.9 kg (171 lb 11.2 oz), SpO2 95 %.   Intake/Output Summary (Last 24 hours) at 01/21/16 0953 Last data filed at 01/21/16 0304  Gross per 24 hour  Intake              600 ml  Output             1500 ml  Net             -900 ml    Exam Awake Alert, Oriented x 3, No new F.N deficits, Normal affect [place].AT,PERRAL Supple Neck,No JVD, No cervical lymphadenopathy  appriciated.  Symmetrical Chest wall movement, Good air movement bilaterally, mild wheezing mostly upper airway RRR,No Gallops,Rubs or new Murmurs, No Parasternal Heave +ve B.Sounds, Abd Soft, Non tender, No organomegaly appriciated, No rebound -guarding or rigidity. No Cyanosis, Clubbing or edema, No new Rash or bruise   Data Review   CBC w Diff:  Lab Results  Component Value Date   WBC 16.2 (H) 01/20/2016   HGB 12.5 01/20/2016   HCT 37.8 01/20/2016   PLT 370 01/20/2016   LYMPHOPCT 10 01/16/2016   BANDSPCT 0 08/09/2011   MONOPCT 4 01/16/2016   EOSPCT 0 01/16/2016   BASOPCT 0 01/16/2016    CMP:  Lab Results  Component Value Date   NA 134 (L) 01/20/2016   K 4.5 01/21/2016   CL 98 (L) 01/20/2016   CO2 26 01/20/2016   BUN 26 (H) 01/20/2016   CREATININE 0.98 01/20/2016   PROT 6.4 (L)  01/20/2016   ALBUMIN 3.4 (L) 01/20/2016   BILITOT 1.0 01/20/2016   ALKPHOS 52 01/20/2016   AST 20 01/20/2016   ALT 26 01/20/2016  .   Total Time in preparing paper work, data evaluation and todays exam - 35 minutes  [name] M.D on 01/21/2016 at 9:53 AM  [facility]   Office  [phone]

## 2016-01-21 NOTE — Progress Notes (Signed)
Patient Name: [name] Date of Encounter: 01/21/2016  Principal Problem:   Acute bronchitis with asthma with acute exacerbation Active Problems:   Diabetes mellitus type 2, uncontrolled ([place])   Essential hypertension   G E R D   PULMONARY EMBOLISM, HX OF   Hyperlipidemia   Chest pain   Acute hypokalemia   Hypomagnesemia   Acute respiratory failure (HCC)   NSTEMI (non-ST elevated myocardial infarction) ([place])   CAD in native artery   Hypertensive heart disease   Primary Cardiologist: Dr Marlou Porch Patient Profile: 35F with history of asthma (all her life), DM, HLD, HTN, GERD, prior PE admitted with acute on chronic asthma exacerbation as well as NSTEMI. LHC 01/18/16: s/p overlapping DESx2 to RCA, 10% ost-prox Cx,10% mLAD. 2D Echo 01/18/16: severe LVH, EF 65-70% with grade 2 DD, high vent filling pressure, mild MR.  SUBJECTIVE: No chest pain, SOB improving, feels ready to go home.  OBJECTIVE Vitals:   01/20/16 1938 01/20/16 2025 01/21/16 0250 01/21/16 0536  BP:  (!) 163/75  (!) 184/78  Pulse:  88  75  Resp:  16  16  Temp:  98.4 F (36.9 C)  98.1 F (36.7 C)  TempSrc:  Oral  Oral  SpO2: 99% 98% 95%   Weight:    171 lb 11.2 oz (77.9 kg)  Height:        Intake/Output Summary (Last 24 hours) at 01/21/16 0845 Last data filed at 01/21/16 0304  Gross per 24 hour  Intake              720 ml  Output             2300 ml  Net            -1580 ml   Filed Weights   01/19/16 0250 01/20/16 0320 01/21/16 0536  Weight: 167 lb 8.8 oz (76 kg) 160 lb 4.4 oz (72.7 kg) 171 lb 11.2 oz (77.9 kg)    PHYSICAL EXAM General: Well developed, well nourished, female in no acute distress. Head: Normocephalic, atraumatic.  Neck: Supple without bruits, JVD not elevated Lungs:  Resp regular and unlabored, slight exp wheeze, mostly upper airway. Heart: RRR, S1, S2, no S3, S4, or murmur; no rub. Abdomen: Soft, non-tender, non-distended, BS + x 4.  Extremities: No clubbing, cyanosis, edema.    Neuro: Alert and oriented X 3. Moves all extremities spontaneously. Psych: Normal affect.  LABS: CBC: Recent Labs  01/19/16 0313 01/20/16 0709  WBC 17.0* 16.2*  HGB 12.0 12.5  HCT 37.8 37.8  MCV 94.0 91.7  PLT 364 0000000   Basic Metabolic Panel: Recent Labs  01/19/16 0313 01/20/16 0709  NA 135 134*  K 4.0 4.3  CL 104 98*  CO2 21* 26  GLUCOSE 178* 170*  BUN 29* 26*  CREATININE 0.94 0.98  CALCIUM 8.8* 8.7*   Liver Function Tests: Recent Labs  01/20/16 0709  AST 20  ALT 26  ALKPHOS 52  BILITOT 1.0  PROT 6.4*  ALBUMIN 3.4*   BNP:  B Natriuretic Peptide  Date/Time Value Ref Range Status  01/16/2016 10:10 AM 35.5 0.0 - 100.0 pg/mL Final  07/30/2015 01:28 PM 57.6 0.0 - 100.0 pg/mL Final    TELE:  SR, No sig ectopy.     Current Medications:  . albuterol  2.5 mg Nebulization TID  . aspirin EC  81 mg Oral q morning - 10a  . atorvastatin  40 mg Oral q morning - 10a  .  diltiazem  120 mg Oral Daily  . estradiol  1 mg Oral q morning - 10a  . hydrALAZINE  100 mg Oral Q8H  . insulin aspart  0-15 Units Subcutaneous TID WC  . insulin aspart  0-5 Units Subcutaneous QHS  . insulin glargine  28 Units Subcutaneous Daily  . irbesartan  150 mg Oral Daily  . methylPREDNISolone (SOLU-MEDROL) injection  60 mg Intravenous TID  . mometasone-formoterol  2 puff Inhalation BID  . montelukast  10 mg Oral QHS  . pantoprazole  40 mg Oral Daily  . sodium chloride flush  3 mL Intravenous Q12H  . ticagrelor  90 mg Oral Q12H      ASSESSMENT AND PLAN: 1. Acute on chronic asthma exacerbation with +rhinovirus - per IM. Still with significant wheezing on exam.  2. NSTEMI/CAD diagnosed this admission - continue aspirin, Brilinta. 30-day free RX on shadow chart. LFTs wnl, increase atorvastatin to 80mg , ck lipids as op. No beta blocker secondary to severe wheezing.  3. Acute on suspected chronic diastolic CHF/hypertensive heart disease -  unclear if weights are accurate. At this point  majority of issue seems related to asthma. K+ & Mg today pending.   4. Hypertension - BP still running high, might be exacerbated by IV steroids. On Diltiazem 60mg  SR only once daily at home. Got Diltiazem CD 120mg  yesterday, increase to 180 mg daily.  Otherwise per IM, ok to dc Principal Problem:   Acute bronchitis with asthma with acute exacerbation Active Problems:   Diabetes mellitus type 2, uncontrolled ([place])   Essential hypertension   G E R D   PULMONARY EMBOLISM, HX OF   Hyperlipidemia   Chest pain   Acute hypokalemia   Hypomagnesemia   Acute respiratory failure (HCC)   NSTEMI (non-ST elevated myocardial infarction) ([place])   CAD in native artery   Hypertensive heart disease   Signed, Lenoard Aden 8:45 AM 01/21/2016  Personally seen and examined. Agree with above.  2 RCA stents Yesterday IV lasix, LE edema improved, compression BP should decrease with IV lasix Agree with changes above. OK with DC Lasix 20mg  PO QD would be reasonable.   Candee Furbish, MD

## 2016-01-21 NOTE — Care Management Important Message (Signed)
Important Message  Patient Details  Name: [name] MRN: [id number] Date of Birth: 1950-08-24   Medicare Important Message Given:  Yes    Orbie Pyo 01/21/2016, 1:29 PM

## 2016-01-21 NOTE — Discharge Instructions (Signed)
Follow with Primary MD [name], MD in 7 days   Get CBC, CMP, 2 view Chest X ray checked  by Primary MD or SNF MD in 5-7 days ( we routinely change or add medications that can affect your baseline labs and fluid status, therefore we recommend that you get the mentioned basic workup next visit with your PCP, your PCP may decide not to get them or add new tests based on their clinical decision)   Activity: As tolerated with Full fall precautions use walker/cane & assistance as needed   Disposition Home     Diet:   Heart Healthy Low carb.  For Heart failure patients - Check your Weight same time everyday, if you gain over 2 pounds, or you develop in leg swelling, experience more shortness of breath or chest pain, call your Primary MD immediately. Follow Cardiac Low Salt Diet and 1.5 lit/day fluid restriction.   On your next visit with your primary care physician please Get Medicines reviewed and adjusted.   Please request your Prim.MD to go over all Hospital Tests and Procedure/Radiological results at the follow up, please get all Hospital records sent to your Prim MD by signing hospital release before you go home.   If you experience worsening of your admission symptoms, develop shortness of breath, life threatening emergency, suicidal or homicidal thoughts you must seek medical attention immediately by calling 911 or calling your MD immediately  if symptoms less severe.  You Must read complete instructions/literature along with all the possible adverse reactions/side effects for all the Medicines you take and that have been prescribed to you. Take any new Medicines after you have completely understood and accpet all the possible adverse reactions/side effects.   Do not drive, operate heavy machinery, perform activities at heights, swimming or participation in water activities or provide baby sitting services if your were admitted for syncope or siezures until you have seen by Primary  MD or a Neurologist and advised to do so again.  Do not drive when taking Pain medications.    Do not take more than prescribed Pain, Sleep and Anxiety Medications  Special Instructions: If you have smoked or chewed Tobacco  in the last 2 yrs please stop smoking, stop any regular Alcohol  and or any Recreational drug use.  Wear Seat belts while driving.   Please note  You were cared for by a hospitalist during your hospital stay. If you have any questions about your discharge medications or the care you received while you were in the hospital after you are discharged, you can call the unit and asked to speak with the hospitalist on call if the hospitalist that took care of you is not available. Once you are discharged, your primary care physician will handle any further medical issues. Please note that NO REFILLS for any discharge medications will be authorized once you are discharged, as it is imperative that you return to your primary care physician (or establish a relationship with a primary care physician if you do not have one) for your aftercare needs so that they can reassess your need for medications and monitor your lab values.                                                       [name] was admitted to the  Hospital on 01/16/2016 and Discharged  01/21/2016 and should be excused from work/school   for 7 days starting from date -  01/16/2016 , may return to work/school without any restrictions.  Call [name] MD, [facility]  [phone] with questions.  [name] M.D on 01/21/2016,at 8:50 AM  [facility]   Office  [phone]

## 2016-01-24 ENCOUNTER — Ambulatory Visit (INDEPENDENT_AMBULATORY_CARE_PROVIDER_SITE_OTHER): Payer: Medicare Other | Admitting: Internal Medicine

## 2016-01-24 ENCOUNTER — Encounter: Payer: Self-pay | Admitting: Internal Medicine

## 2016-01-24 VITALS — BP 160/82 | HR 84 | Ht 62.0 in | Wt 171.0 lb

## 2016-01-24 DIAGNOSIS — R05 Cough: Secondary | ICD-10-CM | POA: Diagnosis not present

## 2016-01-24 DIAGNOSIS — R058 Other specified cough: Secondary | ICD-10-CM

## 2016-01-24 DIAGNOSIS — J4530 Mild persistent asthma, uncomplicated: Secondary | ICD-10-CM | POA: Diagnosis not present

## 2016-01-24 LAB — NITRIC OXIDE: NITRIC OXIDE: 13

## 2016-01-24 MED ORDER — MOMETASONE FURO-FORMOTEROL FUM 100-5 MCG/ACT IN AERO: 2.0000 | INHALATION_SPRAY | Freq: Two times a day (BID) | RESPIRATORY_TRACT | 0 refills | Status: DC

## 2016-01-24 MED ORDER — FAMOTIDINE 20 MG PO TABS: ORAL_TABLET | ORAL | Status: DC

## 2016-01-24 MED ORDER — TRAMADOL HCL 50 MG PO TABS: ORAL_TABLET | ORAL | 0 refills | Status: DC

## 2016-01-24 NOTE — Patient Instructions (Addendum)
Plan A = Automatic = dulera 100 Take 2 puffs first thing in am and then another 2 puffs about 12 hours later.                                      Nexum 40 mg Take 30-60 min before first meal of the day and Pepcid AC 20 mg  at bedtime  Stop symbicort and tums  Work on inhaler technique:  relax and gently blow all the way out then take a nice smooth deep breath back in, triggering the inhaler at same time you start breathing in.  Hold for up to 5 seconds if you can. Blow out thru nose. Rinse and gargle with water when done    Plan B = Backup Only use your albuterol as a rescue medication to be used if you can't catch your breath by resting or doing a relaxed purse lip breathing pattern.  - The less you use it, the better it will work when you need it. - Ok to use the inhaler up to 2 puffs  every 4 hours if you must but call for appointment if use goes up over your usual need - Don't leave home without it !!  (think of it like the spare tire for your car)   Plan C = Crisis - only use your albuterol nebulizer if you first try Plan B and it fails to help > ok to use the nebulizer up to every 4 hours but if start needing it regularly call for immediate appointment   Plan D = Doctor - call me if B and C not adequate  Plan E = ER - go to ER or call 911 if all else fails     GERD (REFLUX)  is an extremely common cause of respiratory symptoms just like yours , many times with no obvious heartburn at all.    It can be treated with medication, but also with lifestyle changes including elevation of the head of your bed (ideally with 6 inch  bed blocks),  Smoking cessation, avoidance of late meals, excessive alcohol, and avoid fatty foods, chocolate, peppermint, colas, red wine, and acidic juices such as orange juice.  NO MINT OR MENTHOL PRODUCTS SO NO COUGH DROPS   USE SUGARLESS CANDY INSTEAD (Jolley ranchers or Stover's or Life Savers) or even ice chips will also do - the key is to swallow to  prevent all throat clearing. NO OIL BASED VITAMINS - use powdered substitutes.   Take delsym two tsp every 12 hours and use the flutter valve as much as possible and supplement if needed with  tramadol 50 mg up to 2 every 4 hours to suppress the urge to cough   Once you have eliminated the cough for 3 straight days try reducing the tramadol first,  then the delsym as tolerated.    Prednisone 5 mg x 8, then 6, then 4 then 2, then 1 daily x 2 days and off   Please schedule a follow up office visit in 2 weeks, sooner if needed with all meds/ inhalers/ neb solutions in hand

## 2016-01-24 NOTE — Progress Notes (Signed)
Subjective:    Patient ID: [name], female    DOB: 1950-09-30,   MRN: [id number]    Brief patient profile:  89 yobf quit smoking 2005 with nl pfts 2012  with recurrent non-specific resp flares which had improved until one week pta p came down with a sinus infection "just like always"   Admission date: 08/06/2014    Discharge Date: 08/08/2014   Admission Diagnosis Shortness of breath [R06.02] Sinusitis [J32.9] Asthma exacerbation [J45.901]  Discharge Diagnosis Shortness of breath [R06.02] Sinusitis [J32.9] Asthma exacerbation [J45.901]   Acute respiratory failure   Essential hypertension  Asthma exacerbation  Diabetes mellitus type 2, controlled  Hyperlipidemia  Sinusitis      Past Medical History  Diagnosis Date  . Asthma   . Diabetes mellitus   . Hypertension   . Dyslipidemia   . Pneumonia   . Bronchitis   . GERD (gastroesophageal reflux disease)     Past Surgical History  Procedure Laterality Date  . Abdominal hysterectomy    . Hernia repair    . Knee arthroscopy    . Hemorrhoid surgery    . Esophagogastroduodenoscopy (egd) with propofol N/A 04/09/2014    Procedure: ESOPHAGOGASTRODUODENOSCOPY (EGD) WITH PROPOFOL; Surgeon: Cleotis Nipper, MD; Location: [facility]; Service: Endoscopy; Laterality: N/A;  . Colonoscopy with propofol N/A 04/09/2014    Procedure: COLONOSCOPY WITH PROPOFOL; Surgeon: Cleotis Nipper, MD; Location: [facility]; Service: Endoscopy; Laterality: N/A;          HPI from the history and physical done on the day of admission  [name] is a 65 y.o. female with a history of asthma, diabetes mellitus type 2 not presently on any medications, hypertension, hyperlipidemia, former smoker presents to the ER because of worsening shortness of breath. Patient states her symptoms started a week ago when patient's primary care physician placed patient  on Zithromax and prednisone tapering dose. Patient had finished the course of antibiotic and prednisone 2 days ago. Last night patient became acutely short of breath. Has been having productive cough denies any fever chills chest pain. On route to the ER and EMS had given patient IV steroids. Patient was given nebulizer and at this time patient is still wheezing and has been admitted as per exacerbation. Chest x-ray shows bronchitis changes. EKG shows sinus tachycardia. Patient otherwise denies any nausea vomiting abdominal pain diarrhea fever chills.  Hospital Course    1. Acute hypoxic respiratory failure secondary to asthma exacerbation. Along with mild URI. Much improved, echocardiogram was stable. Influenza panel was negative. She has responded very well to IV steroids, Levaquin and supportive care. She has been off of oxygen for over 48 hours. Will be discharged home on steroid taper, Levaquin for a few days, along with nebulizer treatments and inhalers. She will follow with pulmonary outpatient. There is question of her having underlying cough related bronchospasm. For which she would follow with pulmonary as well. Was seen by pulmonary this admission here.   2. Leukocytosis. Likely due to #1 above along with steroid use. Afebrile, chest x-ray nonacute, able UA. Repeat CBC in 7-10 days.   3. Dyslipidemia. Continue home dose statin.   4. GERD. On PPI.   5. Essential hypertension. Blood pressure slightly high on ARB. We added hydralazine and blood pressure is in better control.   Discharge Condition: Stable      08/27/2014 hosp f/u ov/[name] re: asthma vs vcd - was not on any controlling meds prior to admit and none at  first ov   Chief Complaint  Patient presents with  . Pulmonary Consult    Self referral.  Recently admitted to [facility] from 08/06/14 to 08/08/14 with sinusitis and asthma flare. She states that she has had asthma all of her life. She states breathing is back to her  normal baseline since hospital d/c.  She c/o PND and cough- non prod. She is using albuterol inhaler 2 x per wk on average and hsa not needed neb.   Off pred/ using peppermints and just one daily nexium/ off pred s flare so far rec At very onset of cough/ wheeze > dulera 100 Take 2 puffs first thing in am and then another 2 puffs about 12 hours later.  Plan A = Automatic = continue to suppress acid by nexium Take 30-60 min before first meal of the day and Pepcid ac 20 mg at bedtime Plan B= Back Only use your albuterol as a rescue medication Plan C = crisis  Only use the nebulizer if you try plan B first and it doesn't work      Admit date: 01/16/2016  Discharge date: 01/21/2016  Admitted From: [facility]   Disposition:  Home   Recommendations for Outpatient Follow-up:   Follow up with PCP in 1-2 weeks  PCP Please obtain BMP/CBC, 2 view CXR in 1week,  (see Discharge instructions)   PCP Please follow up on the following pending results: None   Home Health: None   Equipment/Devices: None  Consultations: Cards Discharge Condition: Stable   CODE STATUS: Full   Diet Recommendation: Heart Healthy Low Carb      Chief Complaint  Patient presents with  . Asthma     Brief history of present illness from the day of admission and additional interim summary    [name] a 65 y.o.femalewith medical history significant for hypertension, diabetes on metformin, history of prior PE and asthma. This past Thursday patient began having upper respiratory infection symptoms with cough and congestion with productive yellow sputum. Because of this she developed asthma exacerbation symptoms which are typical for her with increased wheezing and tightness. She is not had fevers or chills. She's not had any GI symptoms such as nausea, vomiting or diarrhea. She went to her PCP who prescribed prednisone. In addition she utilized her normal nebulizers and MDIs without improvement in her  symptoms. Because of increased respiratory distress she presented to the ER today. No documentation from ER to determine if patient was hypoxemic at presentation. She was wheezing significantly upon arrival and becoming dyspneic with talking. She was also reporting pleuritic chest tightness with taking a deep breath.   She has been exposed to multiple sick contacts with similar symptoms prior to onset of her symptoms.She was positive for coronavirus which likely exacerbated her asthma, also had NSTEMI requiring left heart cath and stent placement.   Hospital issues addressed     1. Acute on chronic asthma exacerbation. She still wheezing, continue IV steroids, added Singulair, added nebulizer treatments both scheduled and as needed, will add Dulera as well and monitor. Respiratory panel positive for coronavirus likely URI exacerbated her asthma + dCHF, SOB better, she has no oxygen requirement for the last 2 days, she still has minimal wheezing but this is mostly upper airway coming from her throat, will be placed on steroid taper, continue home nebulizer treatments and follow with Dr. Melvyn Novas her pulmonary physician in a week.  2. Hypertension. Hydralazine dose doubled, ARB + Cardizem. Follow with PCP  for blood pressure control.  3. History of PE. Normal d-dimer.  4. Hypokalemia. Replaced and stable.  5. GERD. On PPI.  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 Continue statin & Cardizem, no B.Blocker due to Asthma, pain free. All with cardiology outpatient.  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  8. DM2 - continue Home rx.   Discharge diagnosis     Principal Problem:   Acute bronchitis with asthma with acute  exacerbation Active Problems:   Diabetes mellitus type 2, uncontrolled (HCC)   Essential hypertension   G E R D   PULMONARY EMBOLISM, HX OF   Hyperlipidemia   Chest pain   Acute hypokalemia   Hypomagnesemia   Acute respiratory failure (HCC)   NSTEMI (non-ST elevated myocardial infarction) (HCC)   CAD in native artery   Hypertensive heart disease    01/24/2016  Extended post hosp f/u ov/ transition of care/ /[name] re: dtca asthma/ vcd symb 160 and pred 5 x 10 mg daily  Chief Complaint  Patient presents with  . Follow-up    Pt. recently got out the hopistal for an asthma attack, Pt. states her breathing remains the same, coughing with some yellow to white mucus,Has been needing to use Ventolin more often  last doing well 2 months prior to OV   not taking anything at all except for gerd rx but not dosing ac as rec  Waking up prematurely x 2 months with cough/wheeze/ sob but never contacted me as per the instructions  No obvious day to day or daytime variability or assoc excess/ purulent sputum or mucus plugs or hemoptysis or cp or chest tightness, subjective wheeze or overt sinus or hb symptoms. No unusual exp hx or h/o childhood pna/ asthma or knowledge of premature birth.  Sleeping ok without nocturnal  or early am exacerbation  of respiratory  c/o's or need for noct saba. Also denies any obvious fluctuation of symptoms with weather or environmental changes or other aggravating or alleviating factors except as outlined above   Current Medications, Allergies, Complete Past Medical History, Past Surgical History, Family History, and Social History were reviewed in [employer] record.  ROS  The following are not active complaints unless bolded sore throat, dysphagia, dental problems, itching, sneezing,  nasal congestion or excess/ purulent secretions, ear ache,   fever, chills, sweats, unintended wt loss, classically pleuritic or exertional cp,  orthopnea pnd or leg  swelling, presyncope, palpitations, abdominal pain, anorexia, nausea, vomiting, diarrhea  or change in bowel or bladder habits, change in stools or urine, dysuria,hematuria,  rash, arthralgias, visual complaints, headache, numbness, weakness or ataxia or problems with walking or coordination,  change in mood/affect or memory.              Objective:   Physical Exam  amb bf nad grinding uppper airway coughing fits  01/24/2016        171   08/27/14 156 lb 12.8 oz (71.124 kg)  08/08/14 162 lb 4.8 oz (73.619 kg)  04/09/14 155 lb (70.308 kg)    Vital signs reviewed/ sats on arrival RA = 99%  HEENT: nl dentition, turbinates, and orophanx. Nl external ear canals without cough reflex  NECK :  without JVD/Nodes/TM/ nl carotid upstrokes bilaterally   LUNGS: no acc muscle use, clear to A and P bilaterally without cough on insp or exp maneuvers   CV:  RRR  no s3 or murmur or increase in P2, no edema   ABD:  soft and nontender with nl excursion in the supine position. No bruits or organomegaly, bowel sounds nl  MS:  warm without deformities, calf tenderness, cyanosis or clubbing  SKIN: warm and dry without lesions    NEURO:  alert, approp, no deficits     I personally reviewed images and agree with radiology impression as follows:  CXR:   01/16/16  No acute abnormalities.      Assessment & Plan:

## 2016-01-25 ENCOUNTER — Encounter: Payer: Self-pay | Admitting: Physician Assistant

## 2016-01-25 NOTE — Progress Notes (Signed)
Cardiology Office Note    Date:  01/26/2016   ID:  [name], DOB 10-21-1950, MRN [id number]  PCP:  [name], MD  Cardiologist:  Dr. Marlou Porch  CC: post hospital follow up  History of Present Illness:  [name] is a 65 y.o. female with a history of  asthma, DMT2, HLD, HTN, GERD, prior PE and recently diagnosed CAD with NSTEMI in the setting of acute asthma exacerbation who presets to clinic for follow up.   She was admitted 9/17-9/22/17 with acute on chronic asthma exacerbation and acute bronchitis 2/2 coronavirus as well as NSTEMI. She required IV steroids for significant wheezing and respiratory distress. She also had some chest pain. Peak troponin 0.29 and ECG with diffuse ST depression. LHC 01/18/16 showed severe single vessel CAD with severe stenosis in the mid and distal RCA s/p overlapping DESx2 to RCA, 10% ost-prox Cx,10% mLAD. 2D Echo 01/18/16 showed severe LVH, EF 65-70% with grade 2 DD, high vent filling pressure, mild MR. She was also found to be mildly volume overloaded and was diuresed with IV lasix. Discharged on home Diovan HCT. She was also discharged home on ASA and Brilinta as well as a prednisone taper. No BB due to severe asthma   Today she presents to clinic for follow up. She has been walking a little bit (only from the house to mailbox) and still has some SOB but it's getting better. No chest pain. She sometimes get some LE edema, she sleeps 2 pills chronically and now having some PND. No dizziness or syncope. No palpitations. She has had a sleep study before which was negative for sleep apnea.     Past Medical History:  Diagnosis Date  . Anxiety   . Asthma   . Bronchitis   . Chronic diastolic CHF (congestive heart failure) ([place])   . Coronary artery disease    a. NSTEMI 12/2015 - LHC 01/18/16: s/p overlapping DESx2 to RCA, 10% ost-prox Cx,10% mLAD.  Marland Kitchen Depression   . Diabetes mellitus   . Dyslipidemia   . GERD (gastroesophageal reflux disease)   .  Hypertension   . Hypertensive heart disease   . NSTEMI (non-ST elevated myocardial infarction) ([place]) 12/2015  . Pneumonia     Past Surgical History:  Procedure Laterality Date  . ABDOMINAL HYSTERECTOMY    . CARDIAC CATHETERIZATION  1994   minimal LAD dz, no other dz, EF normal  . CARDIAC CATHETERIZATION N/A 01/18/2016   Procedure: Left Heart Cath and Coronary Angiography;  Surgeon: Burnell Blanks, MD;  Location: [facility];  Service: Cardiovascular;  Laterality: N/A;  . CARDIAC CATHETERIZATION N/A 01/18/2016   Procedure: Coronary Stent Intervention;  Surgeon: Burnell Blanks, MD;  Location: [facility];  Service: Cardiovascular;  Laterality: N/A;  . COLONOSCOPY WITH PROPOFOL N/A 04/09/2014   Procedure: COLONOSCOPY WITH PROPOFOL;  Surgeon: Cleotis Nipper, MD;  Location: [facility];  Service: Endoscopy;  Laterality: N/A;  . CORONARY STENT PLACEMENT  01/19/2016   STENT SYNERGY DES [id number] drug eluting stent was successfully placed, and overlaps the 2.5 x 38 mm Synergy stent placed distally.  . ESOPHAGOGASTRODUODENOSCOPY (EGD) WITH PROPOFOL N/A 04/09/2014   Procedure: ESOPHAGOGASTRODUODENOSCOPY (EGD) WITH PROPOFOL;  Surgeon: Cleotis Nipper, MD;  Location: [facility];  Service: Endoscopy;  Laterality: N/A;  . HEMORRHOID SURGERY    . HERNIA REPAIR    . KNEE ARTHROSCOPY      Current Medications: Outpatient Medications Prior to Visit  Medication Sig Dispense  Refill  . albuterol (PROVENTIL HFA;VENTOLIN HFA) 108 (90 Base) MCG/ACT inhaler Inhale 2 puffs into the lungs every 6 (six) hours as needed for wheezing or shortness of breath. 1 Inhaler 5  . albuterol (PROVENTIL) (2.5 MG/3ML) 0.083% nebulizer solution Take 3 mLs (2.5 mg total) by nebulization every 4 (four) hours as needed for shortness of breath. 500 mL 1  . ALPRAZolam (XANAX) 0.5 MG tablet Take 1 tablet (0.5 mg total) by mouth 2 (two) times daily as needed for anxiety. 30 tablet 0  . aspirin EC 81 MG  tablet Take 81 mg by mouth every morning.     Marland Kitchen atorvastatin (LIPITOR) 80 MG tablet Take 1 tablet (80 mg total) by mouth every morning. 30 tablet 0  . diltiazem (CARDIZEM CD) 180 MG 24 hr capsule Take 1 capsule (180 mg total) by mouth daily. 30 capsule 0  . esomeprazole (NEXIUM) 40 MG capsule Take 40 mg by mouth every morning.     Marland Kitchen estradiol (ESTRACE) 1 MG tablet Take 1 mg by mouth every morning.     . fluticasone (FLONASE) 50 MCG/ACT nasal spray Place 2 sprays into both nostrils 2 (two) times daily as needed for allergies or rhinitis.    . hydrALAZINE (APRESOLINE) 100 MG tablet Take 1 tablet (100 mg total) by mouth every 8 (eight) hours. 90 tablet 0  . metFORMIN (GLUCOPHAGE) 500 MG tablet Take 500 mg by mouth daily with breakfast.    . mometasone-formoterol (DULERA) 100-5 MCG/ACT AERO Inhale 2 puffs into the lungs 2 (two) times daily. 1 Inhaler 0  . potassium chloride SA (K-DUR,KLOR-CON) 20 MEQ tablet Take 20 mEq by mouth every morning.     . predniSONE (DELTASONE) 5 MG tablet Label  & dispense according to the schedule below. 10 Pills PO for 3 days then, 8 Pills PO for 3 days, 6 Pills PO for 3 days, 4 Pills PO for 3 days, 2 Pills PO for 3 days, 1 Pills PO for 3 days, 1/2 Pill  PO for 3 days then STOP. Total 95 pills. 95 tablet 0  . traMADol (ULTRAM) 50 MG tablet 1-2 every 4 hours as needed for cough or pain 40 tablet 0  . famotidine (PEPCID) 20 MG tablet One at bedtime (Patient taking differently: Take 20 mg by mouth daily. One at bedtime)    . ticagrelor (BRILINTA) 90 MG TABS tablet Take 1 tablet (90 mg total) by mouth every 12 (twelve) hours. 60 tablet 3  . valsartan-hydrochlorothiazide (DIOVAN-HCT) 320-25 MG tablet Take 1 tablet by mouth daily.     No facility-administered medications prior to visit.      Allergies:   Amlodipine besylate; Lisinopril; and Pantoprazole sodium   Social History   Social History  . Marital status: Married    Spouse name: N/A  . Number of children: N/A  .  Years of education: N/A   Occupational History  . Merchandiser    Social History Main Topics  . Smoking status: Former Smoker    Packs/day: 1.00    Years: 30.00    Types: Cigarettes    Quit date: 01/09/2004  . Smokeless tobacco: Never Used  . Alcohol use No  . Drug use: No  . Sexual activity: Not Asked   Other Topics Concern  . None   Social History Narrative   Pt lives with husband in [place].     Family History:  The patient's family history includes Allergies in her mother; Asthma in her mother; CAD (age of onset: 49)  in her father; CAD (age of onset: 69) in her sister; Congestive Heart Failure in her sister; Diabetes in her father; Hypertension in her father.     ROS:   Please see the history of present illness.    ROS All other systems reviewed and are negative.   PHYSICAL EXAM:   VS:  BP 140/80   Pulse 76   Ht 5\' 2"  (1.575 m)   Wt 167 lb 12.8 oz (76.1 kg)   BMI 30.69 kg/m    GEN: Well nourished, well developed, in no acute distress  HEENT: normal  Neck: no JVD, carotid bruits, or masses Cardiac: RRR; no murmurs, rubs, or gallops,no edema  Respiratory:  clear to auscultation bilaterally, normal work of breathing, mild wheezes GI: soft, nontender, nondistended, + BS MS: no deformity or atrophy  Skin: warm and dry, no rash Neuro:  Alert and Oriented x 3, Strength and sensation are intact Psych: euthymic mood, full affect  Wt Readings from Last 3 Encounters:  01/26/16 167 lb 12.8 oz (76.1 kg)  01/24/16 171 lb (77.6 kg)  01/21/16 171 lb 11.2 oz (77.9 kg)      Studies/Labs Reviewed:   EKG:  EKG is ordered today.  The ekg ordered today demonstrates NSR HR 76  Recent Labs: 01/16/2016: B Natriuretic Peptide 35.5 01/20/2016: ALT 26; BUN 26; Creatinine, Ser 0.98; Hemoglobin 12.5; Platelets 370; Sodium 134 01/21/2016: Magnesium 2.4; Potassium 4.5   Lipid Panel    Component Value Date/Time   CHOL 207 (H) 01/21/2016 0746   TRIG 102 01/21/2016 0746   HDL 77  01/21/2016 0746   CHOLHDL 2.7 01/21/2016 0746   VLDL 20 01/21/2016 0746   LDLCALC 110 (H) 01/21/2016 0746    Additional studies/ records that were reviewed today include:  L.Heart Cath 01/18/16   Mid RCA lesion, 70 %stenosed.  Dist RCA lesion, 90 %stenosed.  A STENT SYNERGY DES [id number] drug eluting stent was successfully placed, and overlaps the 2.5 x 38 mm Synergy stent placed distally.  Prox RCA lesion, 80 %stenosed.  Post intervention, there is a 0% residual stenosis.  Ost Cx to Prox Cx lesion, 10 %stenosed.  Mid LAD lesion, 10 %stenosed.  1. NSTEMI 2. Severe single vessel CAD with severe stenosis in the mid and distal RCA 3. Successful PTCA/DES x 2 mid and distal RCA  Recommendations: Dual anti-platelet therapy with ASA and Brilinta for one year. Continue statin.     TTE 01/18/16 Left ventricle: The cavity size was normal. Septal wall thicknesswas increased in a pattern of severe LVH with mild hypertrophy ofthe posterior wall. There was severe concentric hypertrophy.Systolic function was vigorous. The estimated ejection fractionwas in the range of 65% to 70%. Wall motion was normal; there were no regional wall motion abnormalities. Features areconsistent with a pseudonormal left ventricular filling pattern,with concomitant abnormal relaxation and increased filling pressure (grade 2 diastolic dysfunction). Doppler parameters are consistent with high ventricular filling pressure. - Aortic valve: Transvalvular velocity was within the normal range. There was no stenosis. There was no regurgitation. - Mitral valve: Transvalvular velocity was within the normal range. There was no evidence for stenosis. There was mild regurgitation. - Right ventricle: The cavity size was normal. Wall thickness was normal. Systolic function was normal. - Tricuspid valve: There was trivial regurgitation. - Pulmonary arteries: Systolic pressure was within the normal range.      ASSESSMENT & PLAN:   Recently diagnosed CAD: NSTEMI: cath on 01/18/2016 with severe single vessel CAD with severe stenosis in the  mid and distal RCA with successful PTCA/DES x 2 mid and distal RCA. -- Continue DAPT with ASA and Brilinta for one year. Continue statin & Cardizem, no B.Blocker due to asthma.  Chronic diastolic CHF:  Appears euvolemic today but having symptoms of CHF with LE edema from time to time and some orthopnea and PND. She had high filling pressures on echo and LVEDP 38 at the time of cath. I will stop her Diovan-HCT and start her on Valsartan 320mg  daily and lasix 20mg . Will check a BNP and if significantly elevated will increase lasix for a few days.   Chronic asthma: with recent exacerbation in the setting of acute bronchitis. Continue prednisone taper and follow up with Dr. Melvyn Novas  HTN: BP with borderline control today at 140/80.  History of PE: Normal d-dimer on most recent admission   HLD: LDL 110. Goal <70. Recently started on atorva 80mg  daily   DM2: HgA1c 7.0. Continue Home rx.   Medication Adjustments/Labs and Tests Ordered: Current medicines are reviewed at length with the patient today.  Concerns regarding medicines are outlined above.  Medication changes, Labs and Tests ordered today are listed in the Patient Instructions below. Patient Instructions  Medication Instructions:  Your physician has recommended you make the following change in your medication:  1.  STOP the Diovan/Hctz 2.  START the Diovan 320 mg taking 1 tablet daily 3.  START the Lasix 20 mg taking 1 tablet daily   Labwork: TODAY:  BMET & BNP  Testing/Procedures: None ordered  Follow-Up: Your physician recommends that you schedule a follow-up appointment in: 3 MONTHS WITH DR. Marlou Porch   Any Other Special Instructions Will Be Listed Below (If Applicable).     If you need a refill on your cardiac medications before your next appointment, please call your pharmacy.       Mable Fill, PA-C  01/26/2016 4:00 PM    [facility] [place], [place], [place]  [address] Phone: [phone]; Fax: [phone]

## 2016-01-26 ENCOUNTER — Ambulatory Visit (INDEPENDENT_AMBULATORY_CARE_PROVIDER_SITE_OTHER): Payer: Medicare Other | Admitting: Physician Assistant

## 2016-01-26 ENCOUNTER — Ambulatory Visit: Payer: Medicare Other | Admitting: Podiatry

## 2016-01-26 ENCOUNTER — Encounter: Payer: Self-pay | Admitting: Physician Assistant

## 2016-01-26 ENCOUNTER — Encounter: Payer: Self-pay | Admitting: *Deleted

## 2016-01-26 ENCOUNTER — Telehealth: Payer: Self-pay | Admitting: *Deleted

## 2016-01-26 ENCOUNTER — Other Ambulatory Visit: Payer: Self-pay | Admitting: Physician Assistant

## 2016-01-26 VITALS — BP 140/80 | HR 76 | Ht 62.0 in | Wt 167.8 lb

## 2016-01-26 DIAGNOSIS — I214 Non-ST elevation (NSTEMI) myocardial infarction: Secondary | ICD-10-CM | POA: Diagnosis not present

## 2016-01-26 DIAGNOSIS — Z09 Encounter for follow-up examination after completed treatment for conditions other than malignant neoplasm: Secondary | ICD-10-CM

## 2016-01-26 DIAGNOSIS — E785 Hyperlipidemia, unspecified: Secondary | ICD-10-CM

## 2016-01-26 DIAGNOSIS — I5032 Chronic diastolic (congestive) heart failure: Secondary | ICD-10-CM

## 2016-01-26 DIAGNOSIS — I10 Essential (primary) hypertension: Secondary | ICD-10-CM

## 2016-01-26 DIAGNOSIS — E118 Type 2 diabetes mellitus with unspecified complications: Secondary | ICD-10-CM

## 2016-01-26 MED ORDER — TICAGRELOR 90 MG PO TABS: 90.0000 mg | ORAL_TABLET | Freq: Two times a day (BID) | ORAL | 3 refills | Status: DC

## 2016-01-26 MED ORDER — FUROSEMIDE 40 MG PO TABS: 20.0000 mg | ORAL_TABLET | Freq: Every day | ORAL | 3 refills | Status: DC

## 2016-01-26 MED ORDER — VALSARTAN 320 MG PO TABS: 320.0000 mg | ORAL_TABLET | Freq: Every day | ORAL | 3 refills | Status: DC

## 2016-01-26 NOTE — Patient Instructions (Addendum)
Medication Instructions:  Your physician has recommended you make the following change in your medication:  1.  STOP the Diovan/Hctz 2.  START the Diovan 320 mg taking 1 tablet daily 3.  START the Lasix 20 mg taking 1 tablet daily   Labwork: TODAY:  BMET & BNP  Testing/Procedures: None ordered  Follow-Up: Your physician recommends that you schedule a follow-up appointment in: 3 MONTHS WITH DR. Marlou Porch   Any Other Special Instructions Will Be Listed Below (If Applicable).     If you need a refill on your cardiac medications before your next appointment, please call your pharmacy.

## 2016-01-26 NOTE — Assessment & Plan Note (Addendum)
-   CT sinus 08/06/14 > neg  - 08/27/2014 rec keep duler 100 on hand to use for any flare  - FENO 01/24/2016  =   13 on symb 160 2bid with persistent cough > changed to dulera 100 - 01/24/2016  After extensive coaching HFA effectiveness =    75%   I had an extended discussion with the patient reviewing all relevant studies completed to date and  lasting 25 minutes of a 40  minute  Post hosp/ transition of care office  visit    1) not clear how much of her problem is asthma vs uacs but clearly does not need high dose ICS to control eos airways inflammation and higher doses of ics in all forms can contribute to uacs   2)main focus should be on eliminating cyclical cough with flutter/ tramadol then return first for med rec  3)  To keep things simple, I have asked the patient to first separate medicines that are perceived as maintenance, that is to be taken daily "no matter what", from those medicines that are taken on only on an as-needed basis and I have given the patient examples of both, and then return to see our NP to generate a  detailed  medication calendar which should be followed until the next physician sees the patient and updates it.    Each maintenance medication was reviewed in detail including most importantly the difference between maintenance and prns and under what circumstances the prns are to be triggered using an action plan format that is not reflected in the computer generated alphabetically organized AVS.    Please see instructions for details which were reviewed in writing and the patient given a copy highlighting the part that I personally wrote and discussed at today's ov.

## 2016-01-27 ENCOUNTER — Ambulatory Visit (INDEPENDENT_AMBULATORY_CARE_PROVIDER_SITE_OTHER): Payer: Medicare Other | Admitting: Neurology

## 2016-01-27 ENCOUNTER — Encounter: Payer: Self-pay | Admitting: Neurology

## 2016-01-27 DIAGNOSIS — R569 Unspecified convulsions: Secondary | ICD-10-CM | POA: Diagnosis not present

## 2016-01-27 DIAGNOSIS — R200 Anesthesia of skin: Secondary | ICD-10-CM

## 2016-01-27 DIAGNOSIS — G40909 Epilepsy, unspecified, not intractable, without status epilepticus: Secondary | ICD-10-CM | POA: Insufficient documentation

## 2016-01-27 LAB — BASIC METABOLIC PANEL
BUN: 22 mg/dL (ref 7–25)
CHLORIDE: 94 mmol/L — AB (ref 98–110)
CO2: 25 mmol/L (ref 20–31)
Calcium: 9.6 mg/dL (ref 8.6–10.4)
Creat: 1.06 mg/dL — ABNORMAL HIGH (ref 0.50–0.99)
Glucose, Bld: 270 mg/dL — ABNORMAL HIGH (ref 65–99)
POTASSIUM: 4.8 mmol/L (ref 3.5–5.3)
Sodium: 130 mmol/L — ABNORMAL LOW (ref 135–146)

## 2016-01-27 LAB — BRAIN NATRIURETIC PEPTIDE: Brain Natriuretic Peptide: <4 pg/mL (ref <100)

## 2016-01-27 MED ORDER — DIVALPROEX SODIUM ER 500 MG PO TB24
500.0000 mg | ORAL_TABLET | Freq: Every day | ORAL | 11 refills | Status: DC
Start: 2016-01-27 — End: 2016-11-14

## 2016-01-27 NOTE — Progress Notes (Signed)
PATIENT: [name] DOB: 1950/11/05  Chief Complaint  Patient presents with  . Episodes of confusion    Reports spells of confusion and spacing out that only last for a few seconds.  The first event occurred 4-5 years ago.  Her symptoms are more frequent now.  Says she has had a heart attack this month and had stents placed at [facility].     HISTORICAL  [name] is a 65 years old left-handed female, seen in refer by her primary care doctor [name] for evaluation of episodes of confusion on January 27 2016,  I reviewed and summarized the referring note, she had a history of type 2 diabetes, hypertension, hyperlipidemia, pulmonary emboli and, depression, COPD, coronary artery disease, stent placement following a heart attack in January 18 2016, I reviewed echo report on January 18 2016, ejection fraction 65-70%, severe concentric hypertrophy, septal wall thickness was increased, with mild hypertrophy of the posterior wall, abnormal relaxation, increased filling pressure,  I also reviewed laboratory evaluation BMP showed elevated glucose 270, creatinine 1.06, cholesterol 207, LDL 110, A1c 7.0,  She had her first generalized seizure in 2011, was taken to [place] evaluated by neurologist there, per patient, there was a suggestion of antiepileptic medications, however it was never followed through.  Over the years, she had no generalized seizure, but every few weeks to every few months, she would have episodes of sudden onset confusion, space out, lasting less than 5 minutes  She understands through extreme stress in January 2017, since then, she began to have increased his spells, almost daily basis now, she noted when she was reading, cooking, has transient time elapsed, she came to confused, sometimes preceded by nausea dry mouth sensation.  She denies family history of seizure, she works as a Midwife at [employer].  REVIEW OF SYSTEMS: Full  14 system review of systems performed and notable only for fatigue, chest pain, swelling in legs, ringing ears, eye pain, shortness of breath, cough, wheezing, increased thirst, cramps, confusion, numbness, difficulty swallowing, seizure, passing out, sleepiness, snoring  ALLERGIES: Allergies  Allergen Reactions  . Amlodipine Besylate Other (See Comments)    Reaction unknown  . Lisinopril Other (See Comments)    Reaction unknown  . Pantoprazole Sodium Other (See Comments)    Reaction unknown    HOME MEDICATIONS: Current Outpatient Prescriptions  Medication Sig Dispense Refill  . albuterol (PROVENTIL HFA;VENTOLIN HFA) 108 (90 Base) MCG/ACT inhaler Inhale 2 puffs into the lungs every 6 (six) hours as needed for wheezing or shortness of breath. 1 Inhaler 5  . albuterol (PROVENTIL) (2.5 MG/3ML) 0.083% nebulizer solution Take 3 mLs (2.5 mg total) by nebulization every 4 (four) hours as needed for shortness of breath. 500 mL 1  . ALPRAZolam (XANAX) 0.5 MG tablet Take 1 tablet (0.5 mg total) by mouth 2 (two) times daily as needed for anxiety. 30 tablet 0  . aspirin EC 81 MG tablet Take 81 mg by mouth every morning.     Marland Kitchen atorvastatin (LIPITOR) 80 MG tablet Take 1 tablet (80 mg total) by mouth every morning. 30 tablet 0  . diltiazem (CARDIZEM CD) 180 MG 24 hr capsule Take 1 capsule (180 mg total) by mouth daily. 30 capsule 0  . esomeprazole (NEXIUM) 40 MG capsule Take 40 mg by mouth every morning.     Marland Kitchen estradiol (ESTRACE) 1 MG tablet Take 1 mg by mouth every morning.     . famotidine (PEPCID) 20 MG  tablet Take 20 mg by mouth daily.    . fluticasone (FLONASE) 50 MCG/ACT nasal spray Place 2 sprays into both nostrils 2 (two) times daily as needed for allergies or rhinitis.    . furosemide (LASIX) 40 MG tablet Take 0.5 tablets (20 mg total) by mouth daily. 30 tablet 3  . hydrALAZINE (APRESOLINE) 100 MG tablet Take 1 tablet (100 mg total) by mouth every 8 (eight) hours. 90 tablet 0  . metFORMIN  (GLUCOPHAGE) 500 MG tablet Take 500 mg by mouth daily with breakfast.    . mometasone-formoterol (DULERA) 100-5 MCG/ACT AERO Inhale 2 puffs into the lungs 2 (two) times daily. 1 Inhaler 0  . potassium chloride SA (K-DUR,KLOR-CON) 20 MEQ tablet Take 20 mEq by mouth every morning.     . predniSONE (DELTASONE) 5 MG tablet Label  & dispense according to the schedule below. 10 Pills PO for 3 days then, 8 Pills PO for 3 days, 6 Pills PO for 3 days, 4 Pills PO for 3 days, 2 Pills PO for 3 days, 1 Pills PO for 3 days, 1/2 Pill  PO for 3 days then STOP. Total 95 pills. 95 tablet 0  . ticagrelor (BRILINTA) 90 MG TABS tablet Take 1 tablet (90 mg total) by mouth every 12 (twelve) hours. 180 tablet 3  . traMADol (ULTRAM) 50 MG tablet 1-2 every 4 hours as needed for cough or pain 40 tablet 0  . valsartan (DIOVAN) 320 MG tablet Take 1 tablet (320 mg total) by mouth daily. 90 tablet 3   No current facility-administered medications for this visit.     PAST MEDICAL HISTORY: Past Medical History:  Diagnosis Date  . Anxiety   . Asthma   . Bronchitis   . Chronic diastolic CHF (congestive heart failure) ([place])   . Coronary artery disease    a. NSTEMI 12/2015 - LHC 01/18/16: s/p overlapping DESx2 to RCA, 10% ost-prox Cx,10% mLAD.  Marland Kitchen Depression   . Diabetes mellitus   . Dyslipidemia   . GERD (gastroesophageal reflux disease)   . Heart attack ([place])   . Hypertension   . Hypertensive heart disease   . NSTEMI (non-ST elevated myocardial infarction) ([facility]) 12/2015  . Pneumonia     PAST SURGICAL HISTORY: Past Surgical History:  Procedure Laterality Date  . ABDOMINAL HYSTERECTOMY    . CARDIAC CATHETERIZATION  1994   minimal LAD dz, no other dz, EF normal  . CARDIAC CATHETERIZATION N/A 01/18/2016   Procedure: Left Heart Cath and Coronary Angiography;  Surgeon: Burnell Blanks, MD;  Location: [place] [facility] [facility];  Service: Cardiovascular;  Laterality: N/A;  . CARDIAC CATHETERIZATION N/A 01/18/2016    Procedure: Coronary Stent Intervention;  Surgeon: Burnell Blanks, MD;  Location: [place] [facility];  Service: Cardiovascular;  Laterality: N/A;  . COLONOSCOPY WITH PROPOFOL N/A 04/09/2014   Procedure: COLONOSCOPY WITH PROPOFOL;  Surgeon: Cleotis Nipper, MD;  Location: [facility];  Service: Endoscopy;  Laterality: N/A;  . CORONARY STENT PLACEMENT  01/19/2016   STENT SYNERGY DES [id number] drug eluting stent was successfully placed, and overlaps the 2.5 x 38 mm Synergy stent placed distally.  . ESOPHAGOGASTRODUODENOSCOPY (EGD) WITH PROPOFOL N/A 04/09/2014   Procedure: ESOPHAGOGASTRODUODENOSCOPY (EGD) WITH PROPOFOL;  Surgeon: Cleotis Nipper, MD;  Location: [facility];  Service: Endoscopy;  Laterality: N/A;  . HEMORRHOID SURGERY    . HERNIA REPAIR    . KNEE ARTHROSCOPY      FAMILY HISTORY: Family History  Problem Relation Age of Onset  .  Allergies Mother   . Asthma Mother   . CAD Father 90  . Diabetes Father   . Hypertension Father   . Congestive Heart Failure Sister     died in her 78s  . CAD Sister 58    SOCIAL HISTORY:  Social History   Social History  . Marital status: Married    Spouse name: N/A  . Number of children: 3  . Years of education: some college   Occupational History  . Merchandiser    Social History Main Topics  . Smoking status: Former Smoker    Packs/day: 1.00    Years: 30.00    Types: Cigarettes    Quit date: 01/09/2004  . Smokeless tobacco: Never Used  . Alcohol use No  . Drug use: No  . Sexual activity: Not on file   Other Topics Concern  . Not on file   Social History Narrative   Pt lives with husband in [place].   Right-handed.   2 cups caffeine per day.     PHYSICAL EXAM   Vitals:   01/27/16 0947  BP: (!) 142/71  Pulse: 84  Weight: 169 lb (76.7 kg)  Height: 5\' 2"  (1.575 m)    Not recorded      Body mass index is 30.91 kg/m.  PHYSICAL EXAMNIATION:  Gen: NAD, conversant, well nourised, obese, well groomed                      Cardiovascular: Regular rate rhythm, no peripheral edema, warm, nontender. Eyes: Conjunctivae clear without exudates or hemorrhage Neck: Supple, no carotid bruise. Pulmonary: Clear to auscultation bilaterally   NEUROLOGICAL EXAM:  MENTAL STATUS: Speech:    Speech is normal; fluent and spontaneous with normal comprehension.  Cognition:     Orientation to time, place and person     Normal recent and remote memory     Normal Attention span and concentration     Normal Language, naming, repeating,spontaneous speech     Fund of knowledge   CRANIAL NERVES: CN II: Visual fields are full to confrontation. Fundoscopic exam is normal with sharp discs and no vascular changes. Pupils are round equal and briskly reactive to light. CN III, IV, VI: extraocular movement are normal. No ptosis. CN V: Facial sensation is intact to pinprick in all 3 divisions bilaterally. Corneal responses are intact.  CN VII: Face is symmetric with normal eye closure and smile. CN VIII: Hearing is normal to rubbing fingers CN IX, X: Palate elevates symmetrically. Phonation is normal. CN XI: Head turning and shoulder shrug are intact CN XII: Tongue is midline with normal movements and no atrophy.  MOTOR: There is no pronator drift of out-stretched arms. Muscle bulk and tone are normal. Muscle strength is normal.  REFLEXES: Reflexes are 2+ and symmetric at the biceps, triceps, knees, and ankles. Plantar responses are flexor.  SENSORY: Intact to light touch, pinprick, positional sensation and vibratory sensation are intact in fingers and toes.  COORDINATION: Rapid alternating movements and fine finger movements are intact. There is no dysmetria on finger-to-nose and heel-knee-shin.    GAIT/STANCE: Posture is normal. Gait is steady with normal steps, base, arm swing, and turning. Heel and toe walking are normal. Tandem gait is normal.  Romberg is absent.   DIAGNOSTIC DATA (LABS, IMAGING,  TESTING) - I reviewed patient records, labs, notes, testing and imaging myself where available.   ASSESSMENT AND PLAN  RASHON MAGYAR is a 65 y.o. female  Complex partial seizure  Complete evaluation with MRI brain w/wo  EEG  Depakote ER 500mg  qhs.  No driving till seizure free for 6 months.   Marcial Pacas, M.D. Ph.D.  [facility] [address], [place], [place] [address] Ph: [phone] Fax: [phone]  CC: [name], MD

## 2016-01-27 NOTE — Assessment & Plan Note (Signed)
-   FEN0 01/24/2016 = 13 with active symptoms on symb 160 2bid > changed to dulera 100   - The proper method of use, as well as anticipated side effects, of a metered-dose inhaler are discussed and demonstrated to the patient. Improved effectiveness after extensive coaching during this visit to a level of approximately 75 % from a baseline of 50 % > should do ok on dulera with plan ABCDE reviewed - see avs

## 2016-01-28 ENCOUNTER — Telehealth: Payer: Self-pay | Admitting: Physician Assistant

## 2016-01-28 NOTE — Telephone Encounter (Signed)
New message  Pt verbalized that she is calling for the results of her lab

## 2016-01-28 NOTE — Telephone Encounter (Signed)
Pt aware of her lab results and verbalized understanding.

## 2016-01-28 NOTE — Telephone Encounter (Signed)
-----   Message from Eileen Stanford, PA-C sent at 01/27/2016  3:48 PM EDT ----- BNP is normal so plan to keep everything the same. Lasix 20mg  daily.

## 2016-01-29 DIAGNOSIS — E1165 Type 2 diabetes mellitus with hyperglycemia: Secondary | ICD-10-CM

## 2016-01-29 DIAGNOSIS — E118 Type 2 diabetes mellitus with unspecified complications: Secondary | ICD-10-CM

## 2016-01-29 DIAGNOSIS — IMO0002 Reserved for concepts with insufficient information to code with codable children: Secondary | ICD-10-CM

## 2016-01-29 DIAGNOSIS — J9601 Acute respiratory failure with hypoxia: Secondary | ICD-10-CM

## 2016-01-29 DIAGNOSIS — E785 Hyperlipidemia, unspecified: Secondary | ICD-10-CM

## 2016-01-29 DIAGNOSIS — I2699 Other pulmonary embolism without acute cor pulmonale: Secondary | ICD-10-CM

## 2016-02-08 ENCOUNTER — Ambulatory Visit: Payer: Self-pay | Admitting: Internal Medicine

## 2016-02-08 NOTE — Telephone Encounter (Signed)
Done in error.

## 2016-02-09 ENCOUNTER — Ambulatory Visit (INDEPENDENT_AMBULATORY_CARE_PROVIDER_SITE_OTHER): Payer: Medicare Other | Admitting: Podiatry

## 2016-02-09 ENCOUNTER — Encounter: Payer: Self-pay | Admitting: Podiatry

## 2016-02-09 VITALS — BP 170/81 | HR 86 | Resp 18

## 2016-02-09 DIAGNOSIS — M79676 Pain in unspecified toe(s): Secondary | ICD-10-CM | POA: Diagnosis not present

## 2016-02-09 DIAGNOSIS — B351 Tinea unguium: Secondary | ICD-10-CM | POA: Diagnosis not present

## 2016-02-09 DIAGNOSIS — E119 Type 2 diabetes mellitus without complications: Secondary | ICD-10-CM

## 2016-02-09 NOTE — Patient Instructions (Signed)
Diabetes and Foot Care Diabetes may cause you to have problems because of poor blood supply (circulation) to your feet and legs. This may cause the skin on your feet to become thinner, break easier, and heal more slowly. Your skin may become dry, and the skin may peel and crack. You may also have nerve damage in your legs and feet causing decreased feeling in them. You may not notice minor injuries to your feet that could lead to infections or more serious problems. Taking care of your feet is one of the most important things you can do for yourself.  HOME CARE INSTRUCTIONS  Wear shoes at all times, even in the house. Do not go barefoot. Bare feet are easily injured.  Check your feet daily for blisters, cuts, and redness. If you cannot see the bottom of your feet, use a mirror or ask someone for help.  Wash your feet with warm water (do not use hot water) and mild soap. Then pat your feet and the areas between your toes until they are completely dry. Do not soak your feet as this can dry your skin.  Apply a moisturizing lotion or petroleum jelly (that does not contain alcohol and is unscented) to the skin on your feet and to dry, brittle toenails. Do not apply lotion between your toes.  Trim your toenails straight across. Do not dig under them or around the cuticle. File the edges of your nails with an emery board or nail file.  Do not cut corns or calluses or try to remove them with medicine.  Wear clean socks or stockings every day. Make sure they are not too tight. Do not wear knee-high stockings since they may decrease blood flow to your legs.  Wear shoes that fit properly and have enough cushioning. To break in new shoes, wear them for just a few hours a day. This prevents you from injuring your feet. Always look in your shoes before you put them on to be sure there are no objects inside.  Do not cross your legs. This may decrease the blood flow to your feet.  If you find a minor scrape,  cut, or break in the skin on your feet, keep it and the skin around it clean and dry. These areas may be cleansed with mild soap and water. Do not cleanse the area with peroxide, alcohol, or iodine.  When you remove an adhesive bandage, be sure not to damage the skin around it.  If you have a wound, look at it several times a day to make sure it is healing.  Do not use heating pads or hot water bottles. They may burn your skin. If you have lost feeling in your feet or legs, you may not know it is happening until it is too late.  Make sure your health care provider performs a complete foot exam at least annually or more often if you have foot problems. Report any cuts, sores, or bruises to your health care provider immediately. SEEK MEDICAL CARE IF:   You have an injury that is not healing.  You have cuts or breaks in the skin.  You have an ingrown nail.  You notice redness on your legs or feet.  You feel burning or tingling in your legs or feet.  You have pain or cramps in your legs and feet.  Your legs or feet are numb.  Your feet always feel cold. SEEK IMMEDIATE MEDICAL CARE IF:   There is increasing redness,   swelling, or pain in or around a wound.  There is a red line that goes up your leg.  Pus is coming from a wound.  You develop a fever or as directed by your health care provider.  You notice a bad smell coming from an ulcer or wound.   This information is not intended to replace advice given to you by your health care provider. Make sure you discuss any questions you have with your health care provider.   Document Released: 04/14/2000 Document Revised: 12/18/2012 Document Reviewed: 09/24/2012 Elsevier Interactive Patient Education 2016 Elsevier Inc.  

## 2016-02-09 NOTE — Progress Notes (Signed)
   Subjective:    Patient ID: [name], female    DOB: 10-14-1950, 65 y.o.   MRN: [id number]  HPI     This patient presents today complaining of uncomfortable toenails walking wearing shoes and requests toenail debridement. Since the visit of 05/22/2015 patient has history of MI and cardiovascular stenting. She also has a history of ongoing respiratory problems associated with asthma. Patient currently on leave from retail job    Review of Systems  Cardiovascular:       Had a heart attack on sept 17th 2017 and was in the hospital until sept 22nd 2017       Objective:   Physical Exam   Orientated 3  Vascular: DP pulses 2/4 bilaterally PT pulses 2/4 bilaterally Capillary reflex immediate bilaterally  Neurological: Sensation to 10 g monofilament wire intact 3/5 right 4/5 left Vibratory sensation reactive bilaterally Ankle reflexes reactive bilaterally  Dermatological: No open skin lesions bilaterally Surgical scar dorsal right fourth toe area The toenails elongated, brittle, discolored, hypertrophic 6-10  Musculoskeletal: Pes planus bilaterally HAV right Patient has stable gait No restriction ankle, subtalar, midtarsal joints bilaterally      Assessment & Plan:   Assessment: Diabetic with satisfactory neurovascular status Mycotic toenails 6-10  Plan: Debridement toenails 6-10 mechanical he and legs without anu bleeding  Reappoint 3 months

## 2016-02-11 ENCOUNTER — Ambulatory Visit: Payer: Self-pay | Admitting: Internal Medicine

## 2016-02-11 ENCOUNTER — Telehealth: Payer: Self-pay | Admitting: *Deleted

## 2016-02-11 NOTE — Telephone Encounter (Signed)
I spoke to patient to let him know she is positive for the genotype for Dal-Gene Study. I have scheduled patient for Oct. 27 at 9:00 am to discuss the next steps for the study. Patient verbalized understanding.

## 2016-02-21 ENCOUNTER — Ambulatory Visit (INDEPENDENT_AMBULATORY_CARE_PROVIDER_SITE_OTHER): Payer: Medicare Other | Admitting: Neurology

## 2016-02-21 DIAGNOSIS — R569 Unspecified convulsions: Secondary | ICD-10-CM

## 2016-02-21 DIAGNOSIS — R200 Anesthesia of skin: Secondary | ICD-10-CM

## 2016-02-22 ENCOUNTER — Other Ambulatory Visit: Payer: Self-pay | Admitting: Internal Medicine

## 2016-02-22 NOTE — Telephone Encounter (Signed)
MW

## 2016-02-23 ENCOUNTER — Encounter: Payer: Self-pay | Admitting: Internal Medicine

## 2016-02-23 ENCOUNTER — Ambulatory Visit (INDEPENDENT_AMBULATORY_CARE_PROVIDER_SITE_OTHER): Payer: Medicare Other | Admitting: Internal Medicine

## 2016-02-23 ENCOUNTER — Other Ambulatory Visit (INDEPENDENT_AMBULATORY_CARE_PROVIDER_SITE_OTHER): Payer: Medicare Other

## 2016-02-23 VITALS — BP 150/78 | HR 77 | Ht 62.0 in | Wt 168.8 lb

## 2016-02-23 DIAGNOSIS — R058 Other specified cough: Secondary | ICD-10-CM

## 2016-02-23 DIAGNOSIS — R05 Cough: Secondary | ICD-10-CM | POA: Diagnosis not present

## 2016-02-23 DIAGNOSIS — J4530 Mild persistent asthma, uncomplicated: Secondary | ICD-10-CM

## 2016-02-23 LAB — CBC WITH DIFFERENTIAL/PLATELET
Basophils Absolute: 0.1 10*3/uL (ref 0.0–0.1)
Basophils Relative: 0.8 % (ref 0.0–3.0)
EOS ABS: 0 10*3/uL (ref 0.0–0.7)
EOS PCT: 0.3 % (ref 0.0–5.0)
HCT: 33.6 % — ABNORMAL LOW (ref 36.0–46.0)
Hemoglobin: 11.2 g/dL — ABNORMAL LOW (ref 12.0–15.0)
LYMPHS ABS: 1.4 10*3/uL (ref 0.7–4.0)
Lymphocytes Relative: 18.4 % (ref 12.0–46.0)
MCHC: 33.3 g/dL (ref 30.0–36.0)
MCV: 93.1 fl (ref 78.0–100.0)
MONO ABS: 0.7 10*3/uL (ref 0.1–1.0)
Monocytes Relative: 9.7 % (ref 3.0–12.0)
NEUTROS PCT: 70.8 % (ref 43.0–77.0)
Neutro Abs: 5.3 10*3/uL (ref 1.4–7.7)
Platelets: 356 10*3/uL (ref 150.0–400.0)
RBC: 3.61 Mil/uL — AB (ref 3.87–5.11)
RDW: 14.9 % (ref 11.5–15.5)
WBC: 7.5 10*3/uL (ref 4.0–10.5)

## 2016-02-23 LAB — NITRIC OXIDE: NITRIC OXIDE: 12

## 2016-02-23 LAB — BRAIN NATRIURETIC PEPTIDE: PRO B NATRI PEPTIDE: 13 pg/mL (ref 0.0–100.0)

## 2016-02-23 MED ORDER — ACETAMINOPHEN-CODEINE #3 300-30 MG PO TABS: ORAL_TABLET | ORAL | 0 refills | Status: DC

## 2016-02-23 MED ORDER — FLUTTER DEVI: 0 refills | Status: DC

## 2016-02-23 MED ORDER — PREDNISONE 10 MG PO TABS: ORAL_TABLET | ORAL | 0 refills | Status: DC

## 2016-02-23 MED ORDER — GABAPENTIN 100 MG PO CAPS: 100.0000 mg | ORAL_CAPSULE | Freq: Three times a day (TID) | ORAL | 2 refills | Status: DC

## 2016-02-23 MED ORDER — MOMETASONE FURO-FORMOTEROL FUM 100-5 MCG/ACT IN AERO: 2.0000 | INHALATION_SPRAY | Freq: Two times a day (BID) | RESPIRATORY_TRACT | 0 refills | Status: DC

## 2016-02-23 NOTE — Progress Notes (Signed)
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 Asthma     Brief history of present illness from the day of admission and additional interim summary    [name] a 65 y.o.femalewith medical history significant for hypertension, diabetes on metformin, history of prior PE and asthma. This past Thursday patient began having upper respiratory infection symptoms with cough and congestion with productive yellow sputum. Because of this she developed asthma exacerbation symptoms which are typical for her with increased wheezing and tightness. She is not had fevers or chills. She's not had any GI symptoms such as nausea, vomiting or diarrhea. She went to her PCP who prescribed prednisone. In addition she utilized her normal nebulizers and MDIs without improvement in her symptoms. Because of increased respiratory distress she presented to the ER today. No documentation from ER to determine if patient was hypoxemic at presentation. She was wheezing significantly upon arrival and becoming dyspneic with talking. She was also reporting pleuritic chest tightness with taking a deep breath.   She has been exposed to multiple sick contacts with similar symptoms prior to onset of her symptoms.She was positive for coronavirus which likely exacerbated  her asthma, also had NSTEMI requiring left heart cath and stent placement.   Hospital issues addressed     1. Acute on chronic asthma exacerbation. She still wheezing, continue IV steroids, added Singulair, added nebulizer treatments both scheduled and as needed, will add Dulera as well and monitor. Respiratory panel positive for coronavirus likely URI exacerbated her asthma + dCHF, SOB better, she has no oxygen requirement for the last 2 days, she still has minimal wheezing but this is mostly upper airway coming from her throat, will be placed on steroid taper, continue home nebulizer treatments and follow with Dr. Melvyn Novas her pulmonary physician in a week.  2. Hypertension. Hydralazine dose doubled, ARB + Cardizem. Follow with PCP for blood pressure control.  3. History of PE. Normal d-dimer.  4. Hypokalemia. Replaced and stable.  5. GERD. On PPI.  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 Continue statin & Cardizem, no B.Blocker due to Asthma, pain free. All with cardiology outpatient.  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  8. DM2 - continue Home rx.   Discharge diagnosis     Principal Problem:   Acute bronchitis with asthma with acute exacerbation Active Problems:   Diabetes mellitus type 2, uncontrolled ([facility])   Essential hypertension  G E R D   PULMONARY EMBOLISM, HX OF   Hyperlipidemia   Chest pain   Acute hypokalemia   Hypomagnesemia   Acute respiratory failure ([facility])   NSTEMI (non-ST elevated myocardial infarction) ([facility])   CAD in native artery   Hypertensive heart disease    01/24/2016  Extended post hosp f/u ov/ transition of care/ /[name] re: dtca asthma/ vcd symb 160 and pred 5 x 10 mg  daily  Chief Complaint  Patient presents with  . Follow-up    Pt. recently got out the hopistal for an asthma attack, Pt. states her breathing remains the same, coughing with some yellow to white mucus,Has been needing to use Ventolin more often  last doing well 2 months prior to OV   While on Symb 160/ gerd rx but not dosing ac as rec  Waking up prematurely x 2 months with cough/wheeze/ sob but never contacted me as per the instructions rec Plan A = Automatic = dulera 100 Take 2 puffs first thing in am and then another 2 puffs about 12 hours later.                                      Nexum 40 mg Take 30-60 min before first meal of the day and Pepcid AC 20 mg  at bedtime Work on inhaler technique  Plan B = Backup Only use your albuterol as a rescue medication Plan C = Crisis - only use your albuterol nebulizer if you first try Plan B and it fails to help > ok to use the nebulizer up to every 4 hours but if start needing it regularly call for immediate appointment GERD diet  Take delsym two tsp every 12 hours and use the flutter valve as much as possible and supplement if needed with  tramadol 50 mg up to 2 every 4 hours to suppress the urge to cough  Once you have eliminated the cough for 3 straight days try reducing the tramadol first,  then the delsym as tolerated.   Prednisone 5 mg x 8, then 6, then 4 then 2, then 1 daily x 2 days and off  please schedule a follow up office visit in 2 weeks, sooner if needed with all meds/ inhalers/ neb solutions in hand     02/23/2016  f/u ov/[name] re: dtca vs vcd/ did not bring meds as req Chief Complaint  Patient presents with  . Follow-up    Cough had stopped and then resumed again 1 day ago. She states she woke up coughing at 2 am- non prod and also having chest tightness.   husband says throat clearing never stops x years  Some better on pred but flared w/in a few days of stopping it     No obvious day to day or daytime variability or assoc  excess/ purulent sputum or mucus plugs or hemoptysis or cp or chest tightness, subjective wheeze or overt sinus or hb symptoms. No unusual exp hx or h/o childhood pna/ asthma or knowledge of premature birth.  Sleeping ok without nocturnal  or early am exacerbation  of respiratory  c/o's or need for noct saba. Also denies any obvious fluctuation of symptoms with weather or environmental changes or other aggravating or alleviating factors except as outlined above   Current Medications, Allergies, Complete Past Medical History, Past Surgical History, Family History, and Social History were reviewed in [facility] electronic medical record.  ROS  The following are not active complaints unless bolded sore throat, dysphagia, dental problems, itching, sneezing,  nasal congestion or excess/ purulent secretions, ear ache,   fever, chills, sweats, unintended wt loss, classically pleuritic or exertional cp,  orthopnea pnd or leg swelling, presyncope, palpitations, abdominal pain, anorexia, nausea, vomiting, diarrhea  or change in bowel or bladder habits, change in stools or urine, dysuria,hematuria,  rash, arthralgias, visual complaints, headache, numbness, weakness or ataxia or problems with walking or coordination,  change in mood/affect or memory.              Objective:   Physical Exam  amb bf nad grinding uppper airway coughing      02/23/2016       168  01/24/2016        171   08/27/14 156 lb 12.8 oz (71.124 kg)  08/08/14 162 lb 4.8 oz (73.619 kg)  04/09/14 155 lb (70.308 kg)    Vital signs reviewed - Note on arrival 02 sats  100% on RA     HEENT: nl dentition, turbinates, and orophanx. Nl external ear canals without cough reflex   NECK :  without JVD/Nodes/TM/ nl carotid upstrokes bilaterally   LUNGS: no acc muscle use, clear to A and P bilaterally without cough on insp or exp maneuvers   CV:  RRR  no s3 or murmur or increase in P2, no edema   ABD:  soft and nontender with nl  excursion in the supine position. No bruits or organomegaly, bowel sounds nl  MS:  warm without deformities, calf tenderness, cyanosis or clubbing  SKIN: warm and dry without lesions    NEURO:  alert, approp, no deficits     I personally reviewed images and agree with radiology impression as follows:  CXR:   01/16/16  No acute abnormalities.      Assessment & Plan:

## 2016-02-23 NOTE — Patient Instructions (Addendum)
Plan A = Automatic = dulera 100 Take 2 puffs first thing in am and then another 2 puffs about 12 hours later.                                      Nexum 40 mg Take 30-60 min before first meal of the day and Pepcid AC 20 mg  at bedtime                                     Add neurontin (gabapentin) 100 mg three times daily    Work on perfecting  inhaler technique:  relax and gently blow all the way out then take a nice smooth deep breath back in, triggering the inhaler at same time you start breathing in.  Hold for up to 5 seconds if you can. Blow out thru nose. Rinse and gargle with water when done    Plan B = Backup Only use your albuterol as a rescue medication to be used if you can't catch your breath by resting or doing a relaxed purse lip breathing pattern.  - The less you use it, the better it will work when you need it. - Ok to use the inhaler up to 2 puffs  every 4 hours if you must but call for appointment if use goes up over your usual need - Don't leave home without it !!  (think of it like the spare tire for your car)   Plan C = Crisis - only use your albuterol nebulizer if you first try Plan B and it fails to help > ok to use the nebulizer up to every 4 hours but if start needing it regularly call for immediate appointment    Take delsym two tsp every 12 hours and use the flutter valve as much as possible and supplement if needed with  Tylenol #3   up to 1-2 every 4 hours to suppress the urge to cough   Once you have eliminated the cough for 3 straight days try reducing the tylenol #3  first,  then the delsym as tolerated.     Prednisone 10 mg take  4 each am x 2 days,   2 each am x 2 days,  1 each am x 2 days and stop   Please remember to go to the lab  department downstairs for your tests - we will call you with the results when they are available.     Please schedule a follow up office visit in 2 weeks, sooner if needed with all meds/ inhalers/ neb solutions in hand to see  [name] NP for med calendar Late add : given dulera 100 x one sample = 60 pffs - check count on return

## 2016-02-24 ENCOUNTER — Telehealth: Payer: Self-pay | Admitting: Internal Medicine

## 2016-02-24 LAB — RESPIRATORY ALLERGY PROFILE REGION II ~~LOC~~
ALLERGEN, D PTERNOYSSINUS, D1: 0.17 kU/L — AB
ALLERGEN, MULBERRY, T70: 0.18 kU/L — AB
ALLERGEN, OAK, T7: 0.25 kU/L — AB
Allergen, A. alternata, m6: <0.1 kU/L
Allergen, Cedar tree, t12: 0.36 kU/L — ABNORMAL HIGH
Allergen, Comm Silver Birch, t9: 0.33 kU/L — ABNORMAL HIGH
Allergen, Cottonwood, t14: 0.22 kU/L — ABNORMAL HIGH
Allergen, Mouse Urine Protein, e78: <0.1 kU/L
Allergen, P. notatum, m1: <0.1 kU/L
Aspergillus fumigatus, m3: <0.1 kU/L
Bermuda Grass: 0.89 kU/L — ABNORMAL HIGH
Box Elder IgE: 0.31 kU/L — ABNORMAL HIGH
Cockroach: 0.2 kU/L — ABNORMAL HIGH
Common Ragweed: 0.39 kU/L — ABNORMAL HIGH
D. FARINAE: 0.11 kU/L — AB
ELM IGE: 0.27 kU/L — AB
IgE (Immunoglobulin E), Serum: 87 kU/L (ref <115)
JOHNSON GRASS: 0.78 kU/L — AB
PECAN/HICKORY TREE IGE: 1.68 kU/L — AB
Rough Pigweed  IgE: 0.22 kU/L — ABNORMAL HIGH
Sheep Sorrel IgE: 0.23 kU/L — ABNORMAL HIGH
TIMOTHY GRASS: 3.93 kU/L — AB

## 2016-02-24 NOTE — Telephone Encounter (Signed)
Spoke with pt. She was prescribed Gabapentin yesterday. Pt had questions about this medication. All of her questions were answered. Advised her that if she had any further questions to please let us know. Nothing further was needed.

## 2016-02-25 NOTE — Procedures (Signed)
   HISTORY: 65 years old female, reported recurrent episode of confusion.   TECHNIQUE:  16 channel EEG was performed based on standard 10-16 international system. One channel was dedicated to EKG, which has demonstrates normal sinus rhythm of 72 beats per minutes.  Upon awakening, the posterior background activity was well-developed, in alpha range,  reactive to eye opening and closure.  There was no evidence of epileptiform discharge. There was frequent electrode and bilateral frontal muscle artifact.   Photic stimulation was performed, which induced a symmetric photic driving.  Hyperventilation was not performed.  No sleep was achieved.  CONCLUSION: This is a  normal  awake EEG.  There is no electrodiagnostic evidence of epileptiform discharge.  Marcial Pacas, M.D. Ph.D.  [facility] [place], [place] [address] Phone: [phone] Fax:      [phone]

## 2016-02-25 NOTE — Progress Notes (Signed)
Spoke with pt and notified of results per Dr. Wert. Pt verbalized understanding and denied any questions. 

## 2016-02-27 ENCOUNTER — Inpatient Hospital Stay (HOSPITAL_COMMUNITY)
Admission: EM | Admit: 2016-02-27 | Discharge: 2016-03-02 | DRG: 202 | Disposition: A | Discharge status: Home / Self Care | Payer: Medicare Other | Attending: Internal Medicine | Admitting: Internal Medicine

## 2016-02-27 ENCOUNTER — Encounter (HOSPITAL_COMMUNITY): Payer: Self-pay

## 2016-02-27 ENCOUNTER — Encounter: Payer: Self-pay | Admitting: Internal Medicine

## 2016-02-27 ENCOUNTER — Emergency Department (HOSPITAL_COMMUNITY): Payer: Medicare Other

## 2016-02-27 DIAGNOSIS — I110 Hypertensive heart disease with heart failure: Secondary | ICD-10-CM | POA: Diagnosis present

## 2016-02-27 DIAGNOSIS — E1140 Type 2 diabetes mellitus with diabetic neuropathy, unspecified: Secondary | ICD-10-CM | POA: Diagnosis not present

## 2016-02-27 DIAGNOSIS — Z8249 Family history of ischemic heart disease and other diseases of the circulatory system: Secondary | ICD-10-CM

## 2016-02-27 DIAGNOSIS — Z87891 Personal history of nicotine dependence: Secondary | ICD-10-CM

## 2016-02-27 DIAGNOSIS — Z79899 Other long term (current) drug therapy: Secondary | ICD-10-CM

## 2016-02-27 DIAGNOSIS — J45901 Unspecified asthma with (acute) exacerbation: Principal | ICD-10-CM | POA: Diagnosis present

## 2016-02-27 DIAGNOSIS — E1165 Type 2 diabetes mellitus with hyperglycemia: Secondary | ICD-10-CM | POA: Diagnosis present

## 2016-02-27 DIAGNOSIS — I10 Essential (primary) hypertension: Secondary | ICD-10-CM | POA: Diagnosis present

## 2016-02-27 DIAGNOSIS — I5032 Chronic diastolic (congestive) heart failure: Secondary | ICD-10-CM | POA: Diagnosis present

## 2016-02-27 DIAGNOSIS — K219 Gastro-esophageal reflux disease without esophagitis: Secondary | ICD-10-CM | POA: Diagnosis present

## 2016-02-27 DIAGNOSIS — F418 Other specified anxiety disorders: Secondary | ICD-10-CM | POA: Diagnosis not present

## 2016-02-27 DIAGNOSIS — IMO0002 Reserved for concepts with insufficient information to code with codable children: Secondary | ICD-10-CM

## 2016-02-27 DIAGNOSIS — R0602 Shortness of breath: Secondary | ICD-10-CM | POA: Diagnosis not present

## 2016-02-27 DIAGNOSIS — Z825 Family history of asthma and other chronic lower respiratory diseases: Secondary | ICD-10-CM

## 2016-02-27 DIAGNOSIS — Z7984 Long term (current) use of oral hypoglycemic drugs: Secondary | ICD-10-CM

## 2016-02-27 DIAGNOSIS — E785 Hyperlipidemia, unspecified: Secondary | ICD-10-CM | POA: Diagnosis present

## 2016-02-27 DIAGNOSIS — I252 Old myocardial infarction: Secondary | ICD-10-CM

## 2016-02-27 DIAGNOSIS — Z86711 Personal history of pulmonary embolism: Secondary | ICD-10-CM

## 2016-02-27 DIAGNOSIS — Z833 Family history of diabetes mellitus: Secondary | ICD-10-CM

## 2016-02-27 DIAGNOSIS — M7989 Other specified soft tissue disorders: Secondary | ICD-10-CM | POA: Diagnosis present

## 2016-02-27 DIAGNOSIS — I2510 Atherosclerotic heart disease of native coronary artery without angina pectoris: Secondary | ICD-10-CM | POA: Diagnosis present

## 2016-02-27 DIAGNOSIS — E876 Hypokalemia: Secondary | ICD-10-CM | POA: Diagnosis present

## 2016-02-27 DIAGNOSIS — Z7982 Long term (current) use of aspirin: Secondary | ICD-10-CM

## 2016-02-27 DIAGNOSIS — G8929 Other chronic pain: Secondary | ICD-10-CM | POA: Diagnosis present

## 2016-02-27 LAB — GLUCOSE, CAPILLARY
GLUCOSE-CAPILLARY: 298 mg/dL — AB (ref 65–99)
Glucose-Capillary: 353 mg/dL — ABNORMAL HIGH (ref 65–99)

## 2016-02-27 LAB — BASIC METABOLIC PANEL
Anion gap: 11 (ref 5–15)
BUN: 16 mg/dL (ref 6–20)
CO2: 23 mmol/L (ref 22–32)
Calcium: 8 mg/dL — ABNORMAL LOW (ref 8.9–10.3)
Chloride: 105 mmol/L (ref 101–111)
Creatinine, Ser: 1.04 mg/dL — ABNORMAL HIGH (ref 0.44–1.00)
GFR calc Af Amer: >60 mL/min (ref >60)
GFR calc non Af Amer: 54 mL/min — ABNORMAL LOW (ref >60)
Glucose, Bld: 160 mg/dL — ABNORMAL HIGH (ref 65–99)
Potassium: 2.8 mmol/L — ABNORMAL LOW (ref 3.5–5.1)
Sodium: 139 mmol/L (ref 135–145)

## 2016-02-27 LAB — CBC WITH DIFFERENTIAL/PLATELET
Basophils Absolute: 0.1 10*3/uL (ref 0.0–0.1)
Basophils Relative: 1 %
Eosinophils Absolute: 0 10*3/uL (ref 0.0–0.7)
Eosinophils Relative: 0 %
HCT: 31.9 % — ABNORMAL LOW (ref 36.0–46.0)
Hemoglobin: 10.1 g/dL — ABNORMAL LOW (ref 12.0–15.0)
Lymphocytes Relative: 29 %
Lymphs Abs: 4.2 10*3/uL — ABNORMAL HIGH (ref 0.7–4.0)
MCH: 30.7 pg (ref 26.0–34.0)
MCHC: 31.7 g/dL (ref 30.0–36.0)
MCV: 97 fL (ref 78.0–100.0)
Monocytes Absolute: 1.1 10*3/uL — ABNORMAL HIGH (ref 0.1–1.0)
Monocytes Relative: 8 %
Neutro Abs: 9 10*3/uL — ABNORMAL HIGH (ref 1.7–7.7)
Neutrophils Relative %: 62 %
Platelets: 333 10*3/uL (ref 150–400)
RBC: 3.29 MIL/uL — ABNORMAL LOW (ref 3.87–5.11)
RDW: 14.6 % (ref 11.5–15.5)
WBC: 14.4 10*3/uL — ABNORMAL HIGH (ref 4.0–10.5)

## 2016-02-27 LAB — I-STAT TROPONIN, ED: TROPONIN I, POC: 0 ng/mL (ref 0.00–0.08)

## 2016-02-27 LAB — D-DIMER, QUANTITATIVE: D-Dimer, Quant: 0.28 ug/mL-FEU (ref 0.00–0.50)

## 2016-02-27 LAB — BRAIN NATRIURETIC PEPTIDE: B Natriuretic Peptide: 44 pg/mL (ref 0.0–100.0)

## 2016-02-27 MED ORDER — MAGNESIUM SULFATE 2 GM/50ML IV SOLN
2.0000 g | Freq: Once | INTRAVENOUS | Status: AC
  Administered 2016-02-27: 2 g via INTRAVENOUS
  Filled 2016-02-27: qty 50

## 2016-02-27 MED ORDER — ATORVASTATIN CALCIUM 80 MG PO TABS
80.0000 mg | ORAL_TABLET | Freq: Every day | ORAL | Status: DC
  Administered 2016-02-27 – 2016-03-01 (×4): 80 mg via ORAL
  Filled 2016-02-27 (×4): qty 1

## 2016-02-27 MED ORDER — POTASSIUM CHLORIDE CRYS ER 20 MEQ PO TBCR
20.0000 meq | EXTENDED_RELEASE_TABLET | Freq: Every morning | ORAL | Status: DC
  Administered 2016-02-28 – 2016-03-02 (×4): 20 meq via ORAL
  Filled 2016-02-27 (×4): qty 1

## 2016-02-27 MED ORDER — ALBUTEROL (5 MG/ML) CONTINUOUS INHALATION SOLN
10.0000 mg/h | INHALATION_SOLUTION | Freq: Once | RESPIRATORY_TRACT | Status: AC
  Administered 2016-02-27: 10 mg/h via RESPIRATORY_TRACT
  Filled 2016-02-27: qty 20

## 2016-02-27 MED ORDER — ACETAMINOPHEN 325 MG PO TABS
650.0000 mg | ORAL_TABLET | Freq: Four times a day (QID) | ORAL | Status: DC | PRN
  Administered 2016-02-27: 650 mg via ORAL
  Filled 2016-02-27: qty 2

## 2016-02-27 MED ORDER — METHYLPREDNISOLONE SODIUM SUCC 125 MG IJ SOLR
125.0000 mg | Freq: Once | INTRAMUSCULAR | Status: AC
  Administered 2016-02-27: 125 mg via INTRAVENOUS
  Filled 2016-02-27: qty 2

## 2016-02-27 MED ORDER — ALBUTEROL SULFATE (2.5 MG/3ML) 0.083% IN NEBU
2.5000 mg | INHALATION_SOLUTION | RESPIRATORY_TRACT | Status: DC | PRN
  Administered 2016-03-01: 2.5 mg via RESPIRATORY_TRACT
  Filled 2016-02-27: qty 3

## 2016-02-27 MED ORDER — ENOXAPARIN SODIUM 40 MG/0.4ML ~~LOC~~ SOLN
40.0000 mg | SUBCUTANEOUS | Status: DC
  Administered 2016-02-28 – 2016-03-02 (×4): 40 mg via SUBCUTANEOUS
  Filled 2016-02-27 (×4): qty 0.4

## 2016-02-27 MED ORDER — ACETAMINOPHEN 650 MG RE SUPP: 650.0000 mg | Freq: Four times a day (QID) | RECTAL | Status: DC | PRN

## 2016-02-27 MED ORDER — METHYLPREDNISOLONE SODIUM SUCC 125 MG IJ SOLR
125.0000 mg | Freq: Two times a day (BID) | INTRAMUSCULAR | Status: DC
  Administered 2016-02-27 – 2016-02-28 (×2): 125 mg via INTRAVENOUS
  Filled 2016-02-27 (×2): qty 2

## 2016-02-27 MED ORDER — POTASSIUM CHLORIDE CRYS ER 20 MEQ PO TBCR
40.0000 meq | EXTENDED_RELEASE_TABLET | Freq: Once | ORAL | Status: AC
  Administered 2016-02-27: 40 meq via ORAL
  Filled 2016-02-27: qty 2

## 2016-02-27 MED ORDER — PANTOPRAZOLE SODIUM 40 MG PO TBEC
80.0000 mg | DELAYED_RELEASE_TABLET | Freq: Every day | ORAL | Status: DC
  Administered 2016-02-28 – 2016-03-01 (×3): 80 mg via ORAL
  Filled 2016-02-27 (×4): qty 2

## 2016-02-27 MED ORDER — POLYETHYLENE GLYCOL 3350 17 G PO PACK: 17.0000 g | PACK | Freq: Every day | ORAL | Status: DC | PRN

## 2016-02-27 MED ORDER — ALBUTEROL (5 MG/ML) CONTINUOUS INHALATION SOLN
10.0000 mg/h | INHALATION_SOLUTION | Freq: Once | RESPIRATORY_TRACT | Status: AC
  Administered 2016-02-27: 10 mg/h via RESPIRATORY_TRACT

## 2016-02-27 MED ORDER — NEBIVOLOL HCL 2.5 MG PO TABS
5.0000 mg | ORAL_TABLET | Freq: Every day | ORAL | Status: DC
  Administered 2016-02-27 – 2016-03-02 (×5): 5 mg via ORAL
  Filled 2016-02-27 (×5): qty 2

## 2016-02-27 MED ORDER — IPRATROPIUM-ALBUTEROL 0.5-2.5 (3) MG/3ML IN SOLN
3.0000 mL | Freq: Four times a day (QID) | RESPIRATORY_TRACT | Status: DC
  Administered 2016-02-27 – 2016-02-28 (×2): 3 mL via RESPIRATORY_TRACT
  Filled 2016-02-27 (×2): qty 3

## 2016-02-27 MED ORDER — MOMETASONE FURO-FORMOTEROL FUM 100-5 MCG/ACT IN AERO
2.0000 | INHALATION_SPRAY | Freq: Two times a day (BID) | RESPIRATORY_TRACT | Status: DC
Start: 2016-02-27 — End: 2016-03-02
  Administered 2016-02-27 – 2016-03-02 (×8): 2 via RESPIRATORY_TRACT
  Filled 2016-02-27: qty 8.8

## 2016-02-27 MED ORDER — ONDANSETRON HCL 4 MG/2ML IJ SOLN: 4.0000 mg | Freq: Four times a day (QID) | INTRAMUSCULAR | Status: DC | PRN

## 2016-02-27 MED ORDER — KETOROLAC TROMETHAMINE 15 MG/ML IJ SOLN
15.0000 mg | Freq: Once | INTRAMUSCULAR | Status: AC
  Administered 2016-02-27: 15 mg via INTRAVENOUS
  Filled 2016-02-27: qty 1

## 2016-02-27 MED ORDER — ALPRAZOLAM 0.5 MG PO TABS
0.5000 mg | ORAL_TABLET | Freq: Two times a day (BID) | ORAL | Status: DC | PRN
  Administered 2016-02-28 – 2016-03-01 (×5): 0.5 mg via ORAL
  Filled 2016-02-27 (×4): qty 1
  Filled 2016-02-27: qty 2

## 2016-02-27 MED ORDER — ALBUTEROL SULFATE (2.5 MG/3ML) 0.083% IN NEBU
INHALATION_SOLUTION | RESPIRATORY_TRACT | Status: AC
  Administered 2016-02-27: 5 mg via RESPIRATORY_TRACT
  Filled 2016-02-27: qty 6

## 2016-02-27 MED ORDER — POTASSIUM CHLORIDE 10 MEQ/100ML IV SOLN
10.0000 meq | Freq: Once | INTRAVENOUS | Status: AC
  Administered 2016-02-27: 10 meq via INTRAVENOUS
  Filled 2016-02-27: qty 100

## 2016-02-27 MED ORDER — INSULIN ASPART 100 UNIT/ML ~~LOC~~ SOLN
0.0000 [IU] | Freq: Three times a day (TID) | SUBCUTANEOUS | Status: DC
  Administered 2016-02-27: 20 [IU] via SUBCUTANEOUS
  Administered 2016-02-28: 4 [IU] via SUBCUTANEOUS
  Administered 2016-02-28: 7 [IU] via SUBCUTANEOUS
  Administered 2016-02-28: 4 [IU] via SUBCUTANEOUS
  Administered 2016-02-29 (×2): 7 [IU] via SUBCUTANEOUS
  Administered 2016-02-29: 11 [IU] via SUBCUTANEOUS
  Administered 2016-03-01 (×2): 7 [IU] via SUBCUTANEOUS
  Administered 2016-03-01: 11 [IU] via SUBCUTANEOUS
  Administered 2016-03-02: 7 [IU] via SUBCUTANEOUS

## 2016-02-27 MED ORDER — ONDANSETRON HCL 4 MG PO TABS: 4.0000 mg | ORAL_TABLET | Freq: Four times a day (QID) | ORAL | Status: DC | PRN

## 2016-02-27 MED ORDER — GABAPENTIN 100 MG PO CAPS
100.0000 mg | ORAL_CAPSULE | Freq: Three times a day (TID) | ORAL | Status: DC
  Administered 2016-02-27 – 2016-03-02 (×11): 100 mg via ORAL
  Filled 2016-02-27 (×11): qty 1

## 2016-02-27 MED ORDER — TICAGRELOR 90 MG PO TABS
90.0000 mg | ORAL_TABLET | Freq: Two times a day (BID) | ORAL | Status: DC
  Administered 2016-02-27 – 2016-03-02 (×8): 90 mg via ORAL
  Filled 2016-02-27 (×9): qty 1

## 2016-02-27 MED ORDER — ALBUTEROL SULFATE (2.5 MG/3ML) 0.083% IN NEBU
5.0000 mg | INHALATION_SOLUTION | Freq: Once | RESPIRATORY_TRACT | Status: AC
  Administered 2016-02-27 (×2): 5 mg via RESPIRATORY_TRACT

## 2016-02-27 MED ORDER — ASPIRIN EC 81 MG PO TBEC
81.0000 mg | DELAYED_RELEASE_TABLET | Freq: Every morning | ORAL | Status: DC
  Administered 2016-02-27 – 2016-03-02 (×5): 81 mg via ORAL
  Filled 2016-02-27 (×5): qty 1

## 2016-02-27 MED ORDER — ENSURE ENLIVE PO LIQD: 237.0000 mL | Freq: Two times a day (BID) | ORAL | Status: DC

## 2016-02-27 MED ORDER — HYDROCODONE-ACETAMINOPHEN 5-325 MG PO TABS
1.0000 | ORAL_TABLET | Freq: Four times a day (QID) | ORAL | Status: DC | PRN
  Administered 2016-02-27 – 2016-03-01 (×8): 2 via ORAL
  Filled 2016-02-27 (×9): qty 2

## 2016-02-27 MED ORDER — DIVALPROEX SODIUM ER 500 MG PO TB24
500.0000 mg | ORAL_TABLET | Freq: Every day | ORAL | Status: DC
  Administered 2016-02-27 – 2016-03-01 (×4): 500 mg via ORAL
  Filled 2016-02-27 (×4): qty 1

## 2016-02-27 MED ORDER — FUROSEMIDE 20 MG PO TABS
20.0000 mg | ORAL_TABLET | Freq: Every day | ORAL | Status: DC
  Administered 2016-02-27 – 2016-03-02 (×5): 20 mg via ORAL
  Filled 2016-02-27 (×5): qty 1

## 2016-02-27 MED ORDER — SODIUM CHLORIDE 0.9 % IV SOLN
INTRAVENOUS | Status: DC
  Administered 2016-02-27: 19:00:00 via INTRAVENOUS

## 2016-02-27 MED ORDER — IRBESARTAN 300 MG PO TABS
300.0000 mg | ORAL_TABLET | Freq: Every day | ORAL | Status: DC
  Administered 2016-02-27 – 2016-03-02 (×5): 300 mg via ORAL
  Filled 2016-02-27 (×5): qty 1

## 2016-02-27 MED ORDER — DILTIAZEM HCL ER COATED BEADS 180 MG PO CP24
180.0000 mg | ORAL_CAPSULE | Freq: Every day | ORAL | Status: DC
  Administered 2016-02-27 – 2016-03-02 (×5): 180 mg via ORAL
  Filled 2016-02-27 (×7): qty 1

## 2016-02-27 MED ORDER — FAMOTIDINE 20 MG PO TABS
20.0000 mg | ORAL_TABLET | Freq: Every day | ORAL | Status: DC
  Administered 2016-02-27 – 2016-02-28 (×2): 20 mg via ORAL
  Filled 2016-02-27 (×2): qty 1

## 2016-02-27 MED ORDER — HYDROCOD POLST-CPM POLST ER 10-8 MG/5ML PO SUER
5.0000 mL | Freq: Once | ORAL | Status: AC
  Administered 2016-02-27: 5 mL via ORAL
  Filled 2016-02-27: qty 5

## 2016-02-27 NOTE — Progress Notes (Signed)
Report received from [facility] for admission to [facility]

## 2016-02-27 NOTE — ED Notes (Signed)
Report called to rn on 5 w 

## 2016-02-27 NOTE — ED Notes (Signed)
The pt is getting a hhn hour long  She is audibly wheezing  But she feels a little better.  Pot chloride just went in  Iv is positional  Pt request another iv placement.  Report received that the pt is a very difficult stick

## 2016-02-27 NOTE — ED Triage Notes (Signed)
Patient here with acute shortness of breath with wheezing since early am. Alert and oriented, used neb with no relief.

## 2016-02-27 NOTE — Progress Notes (Signed)
Notified Schorr, NP that pt requesting pain medication for chest pain when coughing rating it 7. Tylenol is ineffective. NP placed orders for tussionex and Toradol. Will continue to monitor pt. Ranelle Oyster, RN

## 2016-02-27 NOTE — Assessment & Plan Note (Signed)
-   FEN0 01/24/2016 = 13 with active symptoms on symb 160 2bid > changed to dulera 100  - Spirometry 02/23/2016  NO signifiant obstruction with active symptoms  - 02/23/2016  After extensive coaching HFA effectiveness =    75%  - FENO 02/23/2016  =  12   On dulera 100 2bid (unable to confirm adeherence as did not bring it - Singulair 10 mg daily added 02/23/2016 to dulera 100 2bid with 2 week sample only  I really don't sense she has much asthma based on today's spirometry so rec continue low dose ics only

## 2016-02-27 NOTE — H&P (Addendum)
[facility] History and Physical  [name] [id number] DOB: Feb 11, 1951 DOA: 02/27/2016  Referring physician: PCP: [name], MD   Chief Complaint: "The medicine wasn't working."  HPI: [name] is a 65 y.o. female with past medical history significant for anxiety, asthma, heart failure, diabetes, or cortical infarction and PE presented to the emergency room chief complaint of shortness of breath. Patient states about 4 days ago was short of breath when to see her pulmonologist. She was put on scheduled inhalers along with steroids. She is currently on day for the taper does not feel any better. Since it's the weekend she came to the emergency room for evaluation.  Patient has had upper respiratory tract infection symptoms for about a week. She states that this is not typical trigger for asthma exacerbation for her. States her typical trigger is seasonal allergies. Patient is not on any medication for seasonal allergies. Patient does not smoke.  Denies fevers, chills, nausea, vomiting, diarrhea, rash, chest pain and headache.  ED course: Patient received high-dose steroids along with 3 breathing treatments with no resolution of symptoms. Patient's O2 saturation good but still tachypnea and using accessory muscles.  Review of Systems:  As per HPI otherwise 10 point review of systems negative.    Past Medical History:  Diagnosis Date  . Anxiety   . Asthma   . Bronchitis   . Chronic diastolic CHF (congestive heart failure) ([place])   . Coronary artery disease    a. NSTEMI 12/2015 - LHC 01/18/16: s/p overlapping DESx2 to RCA, 10% ost-prox Cx,10% mLAD.  Marland Kitchen Depression   . Diabetes mellitus   . Dyslipidemia   . GERD (gastroesophageal reflux disease)   . Heart attack   . Hypertension   . Hypertensive heart disease   . NSTEMI (non-ST elevated myocardial infarction) ([place]) 12/2015  . Pneumonia    Past Surgical History:  Procedure Laterality Date  . ABDOMINAL  HYSTERECTOMY    . CARDIAC CATHETERIZATION  1994   minimal LAD dz, no other dz, EF normal  . CARDIAC CATHETERIZATION N/A 01/18/2016   Procedure: Left Heart Cath and Coronary Angiography;  Surgeon: Burnell Blanks, MD;  Location: [place] [facility];  Service: Cardiovascular;  Laterality: N/A;  . CARDIAC CATHETERIZATION N/A 01/18/2016   Procedure: Coronary Stent Intervention;  Surgeon: Burnell Blanks, MD;  Location: [facility];  Service: Cardiovascular;  Laterality: N/A;  . COLONOSCOPY WITH PROPOFOL N/A 04/09/2014   Procedure: COLONOSCOPY WITH PROPOFOL;  Surgeon: Cleotis Nipper, MD;  Location: [facility];  Service: Endoscopy;  Laterality: N/A;  . CORONARY STENT PLACEMENT  01/19/2016   STENT SYNERGY DES [id number] drug eluting stent was successfully placed, and overlaps the 2.5 x 38 mm Synergy stent placed distally.  . ESOPHAGOGASTRODUODENOSCOPY (EGD) WITH PROPOFOL N/A 04/09/2014   Procedure: ESOPHAGOGASTRODUODENOSCOPY (EGD) WITH PROPOFOL;  Surgeon: Cleotis Nipper, MD;  Location: [facility];  Service: Endoscopy;  Laterality: N/A;  . HEMORRHOID SURGERY    . HERNIA REPAIR    . KNEE ARTHROSCOPY     Social History:  reports that she quit smoking about 12 years ago. Her smoking use included Cigarettes. She has a 30.00 pack-year smoking history. She has never used smokeless tobacco. She reports that she does not drink alcohol or use drugs.  Allergies  Allergen Reactions  . Amlodipine Besylate Other (See Comments)    Reaction unknown  . Lisinopril Other (See Comments)    Reaction unknown  . Pantoprazole Sodium Other (See Comments)  Reaction unknown    Family History  Problem Relation Age of Onset  . Allergies Mother   . Asthma Mother   . CAD Father 14  . Diabetes Father   . Hypertension Father   . Congestive Heart Failure Sister     died in her 62s  . CAD Sister 57     Prior to Admission medications   Medication Sig Start Date End Date Taking? Authorizing  Provider  albuterol (PROVENTIL HFA;VENTOLIN HFA) 108 (90 Base) MCG/ACT inhaler Inhale 2 puffs into the lungs every 6 (six) hours as needed for wheezing or shortness of breath. 08/12/15  Yes Magdalen Spatz, NP  albuterol (PROVENTIL) (2.5 MG/3ML) 0.083% nebulizer solution Take 3 mLs (2.5 mg total) by nebulization every 4 (four) hours as needed for shortness of breath. 07/31/15  Yes Theodis Blaze, MD  ALPRAZolam Duanne Moron) 0.5 MG tablet Take 1 tablet (0.5 mg total) by mouth 2 (two) times daily as needed for anxiety. 07/31/15  Yes Theodis Blaze, MD  aspirin EC 81 MG tablet Take 81 mg by mouth every morning.    Yes Historical Provider, MD  atorvastatin (LIPITOR) 80 MG tablet Take 1 tablet (80 mg total) by mouth every morning. 01/22/16  Yes [name], MD  BYSTOLIC 5 MG tablet Take 5 mg by mouth daily. 02/21/16  Yes Historical Provider, MD  divalproex (DEPAKOTE ER) 500 MG 24 hr tablet Take 1 tablet (500 mg total) by mouth at bedtime. 01/27/16  Yes Marcial Pacas, MD  esomeprazole (NEXIUM) 40 MG capsule Take 40 mg by mouth every morning.    Yes Historical Provider, MD  estradiol (ESTRACE) 1 MG tablet Take 1 mg by mouth every morning.    Yes Historical Provider, MD  famotidine (PEPCID) 20 MG tablet Take 20 mg by mouth daily.   Yes Historical Provider, MD  furosemide (LASIX) 40 MG tablet Take 0.5 tablets (20 mg total) by mouth daily. 01/26/16 04/25/16 Yes Eileen Stanford, PA-C  gabapentin (NEURONTIN) 100 MG capsule Take 1 capsule (100 mg total) by mouth 3 (three) times daily. One three times daily 02/23/16  Yes Tanda Rockers, MD  metFORMIN (GLUCOPHAGE) 500 MG tablet Take 500 mg by mouth daily with breakfast.   Yes Historical Provider, MD  mometasone-formoterol (DULERA) 100-5 MCG/ACT AERO Inhale 2 puffs into the lungs 2 (two) times daily. 02/23/16  Yes Tanda Rockers, MD  potassium chloride SA (K-DUR,KLOR-CON) 20 MEQ tablet Take 20 mEq by mouth every morning.    Yes Historical Provider, MD  predniSONE (DELTASONE) 10  MG tablet Take  4 each am x 2 days,   2 each am x 2 days,  1 each am x 2 days and stop Patient taking differently: Take 20 mg by mouth daily. 2 each am x 2 days,  1 each am x 2 days and stop. ( patient's last dose 20 mg ) 02/23/16  Yes Tanda Rockers, MD  Respiratory Therapy Supplies (FLUTTER) DEVI Use as directed 02/23/16  Yes Tanda Rockers, MD  ticagrelor (BRILINTA) 90 MG TABS tablet Take 1 tablet (90 mg total) by mouth every 12 (twelve) hours. 01/26/16  Yes Eileen Stanford, PA-C  valsartan (DIOVAN) 320 MG tablet Take 1 tablet (320 mg total) by mouth daily. 01/26/16  Yes Eileen Stanford, PA-C  acetaminophen-codeine (TYLENOL #3) 300-30 MG tablet One every 4 hours as needed for cough 02/23/16   Tanda Rockers, MD  diltiazem (DILACOR XR) 180 MG 24 hr capsule Take 180  mg by mouth daily. 02/22/16   Historical Provider, MD   Physical Exam: Vitals:   02/27/16 1145 02/27/16 1200 02/27/16 1215 02/27/16 1315  BP: (!) 203/65 171/64 154/57 156/70  Pulse: 112 119 114 104  Resp:  22 19   Temp:      SpO2: 100% 96% 97% 97%    Wt Readings from Last 3 Encounters:  02/23/16 76.6 kg (168 lb 12.8 oz)  01/27/16 76.7 kg (169 lb)  01/26/16 76.1 kg (167 lb 12.8 oz)    General:  Appears calm and comfortable Eyes:  PERRL, EOMI, normal lids, iris ENT:  grossly normal hearing, lips & tongue Neck:  no LAD, masses or thyromegaly Cardiovascular:  RRR, no m/r/g. No LE edema.  Respiratory:  Patient in moderate to severe respiratory distress, tachypnea, using accessory muscles. Able to speak in short sentences. Abdomen:  soft, ntnd Skin:  no rash or induration seen on limited exam Musculoskeletal:  grossly normal tone BUE/BLE Psychiatric:  grossly normal mood and affect, speech fluent and appropriate Neurologic:  CN 2-12 grossly intact, moves all extremities in coordinated fashion. alert and oriented 3.           Labs on Admission:  Basic Metabolic Panel:  Recent Labs Lab 02/27/16 1106  NA 139  K  2.8*  CL 105  CO2 23  GLUCOSE 160*  BUN 16  CREATININE 1.04*  CALCIUM 8.0*   Liver Function Tests: No results for input(s): AST, ALT, ALKPHOS, BILITOT, PROT, ALBUMIN in the last 168 hours. No results for input(s): LIPASE, AMYLASE in the last 168 hours. No results for input(s): AMMONIA in the last 168 hours. CBC:  Recent Labs Lab 02/23/16 1122 02/27/16 1106  WBC 7.5 14.4*  NEUTROABS 5.3 9.0*  HGB 11.2* 10.1*  HCT 33.6* 31.9*  MCV 93.1 97.0  PLT 356.0 333   Cardiac Enzymes: No results for input(s): CKTOTAL, CKMB, CKMBINDEX, TROPONINI in the last 168 hours.  BNP (last 3 results)  Recent Labs  01/16/16 1010 01/26/16 1608 02/27/16 1107  BNP 35.5 <4.0 44.0    ProBNP (last 3 results)  Recent Labs  02/23/16 1122  PROBNP 13.0     Serum creatinine: 1.04 mg/dL High 02/27/16 1106 Estimated creatinine clearance: 50.3 mL/min  CBG: No results for input(s): GLUCAP in the last 168 hours.  Radiological Exams on Admission: Dg Chest 2 View  Result Date: 02/27/2016 CLINICAL DATA:  Shortness of breath and wheezing since this morning. Chest tightness. History of asthma and pneumonia. EXAM: CHEST  2 VIEW COMPARISON:  01/16/2016. FINDINGS: Normal sized heart. Minimal left basilar atelectasis. Otherwise, clear lungs. Mild diffuse peribronchial thickening. Mild lower thoracic spine degenerative changes. IMPRESSION: Mild bronchitic changes and minimal left basilar atelectasis. Electronically Signed   By: Claudie Revering M.D.   On: 02/27/2016 13:26    EKG: Independently reviewed.   Assessment/Plan Principal Problem:   Asthma exacerbation Active Problems:   Diabetes mellitus type 2, uncontrolled (HCC)   Essential hypertension   HLD (hyperlipidemia)  Asthma/Wheeze Duoneb sch When necessary albuterol Antibiotic-none Oxygen therapy Continuous pulse oximetry Well's score for PE score of 3, patient has previous history of pulmonary embolism will check a d-dimer, holding birth  control Continue to [facility] IV steroids 125 mg twice a day Right leg swelling Venous Doppler right leg KVO  DM Hold metformin SSI  CHF/CAD Continue Bystolic, diltiazem, lasix, diovan->avapro, brilinta, asa, daily kdur  Chronic pain Continue gabapentin  Hypertension When necessary hydralazine 10 mg IV as needed for severe blood pressure  Hyperlipidemia Continue statin  Hypokalemia 2.8 on admit 50 mEq given in the ED Kdur ordered for tonight Recheck in AM  GERD Cont pepcid, nexium->protonix  Depression w/ anxiety No SI/HI Depakote cont Prn xanax  Hyperlipidemia Continue statin   Status- obs, MedSurg  Code Status: full DVT Prophylaxis: lovenox Family Communication: pt to speak to husband Disposition Plan: Pending Improvement    Elwin Mocha, MD Family Medicine [facility] [email or web address] Password [email or web address]

## 2016-02-27 NOTE — ED Provider Notes (Signed)
[facility] DEPT Provider Note   CSN: [id number] Arrival date & time: 02/27/16  [id number]     History   Chief Complaint Chief Complaint  Patient presents with  . Shortness of Breath  . Wheezing    HPI [name] is a 65 y.o. female.  HPI    65 year old female presents today with asthma exacerbation. Patient reports significant past medical history of the same, currently being followed by lower pulmonary. Patient reports she was seen 4 days ago for asthma exacerbation, started on steroids. Patient reports that she was improving until last night when she started having worsening wheezing and shortness of breath. Patient notes that this morning she attempted using nebulizer at home which did not improve her symptoms, continued audible wheeze and shortness of breath. Patient reports she is currently on steroids. She reports upper respiratory infection that is likely the cause of today's exacerbation; but notes that URI symptoms have improved. Patient reports she's having minor chest pain and tightness. Patient also reports minor lower extremity edema, worse on the right; this is normal per patient and was present prior to acute episode.   Patient was admitted in September 2017 with acute on chronic asthma exacerbation. She had similar presentation in that she was seen by her primary care started on steroids and had worsening of symptoms with the evaluation. At that time she had an STEMI, with a heart cath in 01/18/2016 with severe single vessel CAD with severe stenosis in the mid and distal RCA with successful drug-eluting stent.     Past Medical History:  Diagnosis Date  . Anxiety   . Asthma   . Bronchitis   . Chronic diastolic CHF (congestive heart failure) ([place])   . Coronary artery disease    a. NSTEMI 12/2015 - LHC 01/18/16: s/p overlapping DESx2 to RCA, 10% ost-prox Cx,10% mLAD.  Marland Kitchen Depression   . Diabetes mellitus   . Dyslipidemia   . GERD (gastroesophageal reflux disease)     . Heart attack   . Hypertension   . Hypertensive heart disease   . NSTEMI (non-ST elevated myocardial infarction) ([place]) 12/2015  . Pneumonia     Patient Active Problem List   Diagnosis Date Noted  . Acute respiratory failure with hypoxia ([place])   . Uncontrolled type 2 diabetes mellitus with complication ([place])   . Acute pulmonary embolism ([place])   . HLD (hyperlipidemia)   . Seizures ([place]) 01/27/2016  . Numbness 01/27/2016  . CAD in native artery 01/20/2016  . Hypertensive heart disease 01/20/2016  . NSTEMI (non-ST elevated myocardial infarction) ([place]) 01/18/2016  . Acute bronchitis with asthma with acute exacerbation 01/16/2016  . Acute hypokalemia 01/16/2016  . Hypomagnesemia 01/16/2016  . Acute respiratory failure ([place]) 01/16/2016  . Dyspnea   . Chest pain   . Asthma exacerbation 07/21/2015  . Upper airway cough syndrome vs Asthma  08/28/2014  . Diabetes mellitus type 2, controlled ([place]) 08/06/2014  . Hyperlipidemia 08/06/2014  . ALLERGIC RHINITIS 07/22/2008  . 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 DYSPNEA 07/22/2008  . Diabetes mellitus type 2, uncontrolled ([place]) 07/21/2008  . DIABETIC PERIPHERAL NEUROPATHY 07/21/2008  . DEPRESSION 07/21/2008  . Essential hypertension 07/21/2008  . Mild persistent chronic asthma without complication [id number]  . G E R D 07/21/2008  . PULMONARY EMBOLISM, HX OF 07/21/2008    Past Surgical History:  Procedure Laterality Date  . ABDOMINAL HYSTERECTOMY    . CARDIAC CATHETERIZATION  1994   minimal LAD dz, no other dz, EF  normal  . CARDIAC CATHETERIZATION N/A 01/18/2016   Procedure: Left Heart Cath and Coronary Angiography;  Surgeon: Burnell Blanks, MD;  Location: [facility];  Service: Cardiovascular;  Laterality: N/A;  . CARDIAC CATHETERIZATION N/A 01/18/2016   Procedure: Coronary Stent Intervention;  Surgeon: Burnell Blanks, MD;  Location: [facility];  Service: Cardiovascular;  Laterality: N/A;  .  COLONOSCOPY WITH PROPOFOL N/A 04/09/2014   Procedure: COLONOSCOPY WITH PROPOFOL;  Surgeon: Cleotis Nipper, MD;  Location: [facility];  Service: Endoscopy;  Laterality: N/A;  . CORONARY STENT PLACEMENT  01/19/2016   STENT SYNERGY DES [id number] drug eluting stent was successfully placed, and overlaps the 2.5 x 38 mm Synergy stent placed distally.  . ESOPHAGOGASTRODUODENOSCOPY (EGD) WITH PROPOFOL N/A 04/09/2014   Procedure: ESOPHAGOGASTRODUODENOSCOPY (EGD) WITH PROPOFOL;  Surgeon: Cleotis Nipper, MD;  Location: [facility];  Service: Endoscopy;  Laterality: N/A;  . HEMORRHOID SURGERY    . HERNIA REPAIR    . KNEE ARTHROSCOPY      OB History    No data available       Home Medications    Prior to Admission medications   Medication Sig Start Date End Date Taking? Authorizing Provider  albuterol (PROVENTIL HFA;VENTOLIN HFA) 108 (90 Base) MCG/ACT inhaler Inhale 2 puffs into the lungs every 6 (six) hours as needed for wheezing or shortness of breath. 08/12/15  Yes Magdalen Spatz, NP  albuterol (PROVENTIL) (2.5 MG/3ML) 0.083% nebulizer solution Take 3 mLs (2.5 mg total) by nebulization every 4 (four) hours as needed for shortness of breath. 07/31/15  Yes Theodis Blaze, MD  ALPRAZolam Duanne Moron) 0.5 MG tablet Take 1 tablet (0.5 mg total) by mouth 2 (two) times daily as needed for anxiety. 07/31/15  Yes Theodis Blaze, MD  aspirin EC 81 MG tablet Take 81 mg by mouth every morning.    Yes Historical Provider, MD  atorvastatin (LIPITOR) 80 MG tablet Take 1 tablet (80 mg total) by mouth every morning. 01/22/16  Yes [name], MD  BYSTOLIC 5 MG tablet Take 5 mg by mouth daily. 02/21/16  Yes Historical Provider, MD  divalproex (DEPAKOTE ER) 500 MG 24 hr tablet Take 1 tablet (500 mg total) by mouth at bedtime. 01/27/16  Yes Marcial Pacas, MD  esomeprazole (NEXIUM) 40 MG capsule Take 40 mg by mouth every morning.    Yes Historical Provider, MD  estradiol (ESTRACE) 1 MG tablet Take 1 mg by mouth every morning.     Yes Historical Provider, MD  famotidine (PEPCID) 20 MG tablet Take 20 mg by mouth daily.   Yes Historical Provider, MD  furosemide (LASIX) 40 MG tablet Take 0.5 tablets (20 mg total) by mouth daily. 01/26/16 04/25/16 Yes Eileen Stanford, PA-C  gabapentin (NEURONTIN) 100 MG capsule Take 1 capsule (100 mg total) by mouth 3 (three) times daily. One three times daily 02/23/16  Yes Tanda Rockers, MD  metFORMIN (GLUCOPHAGE) 500 MG tablet Take 500 mg by mouth daily with breakfast.   Yes Historical Provider, MD  mometasone-formoterol (DULERA) 100-5 MCG/ACT AERO Inhale 2 puffs into the lungs 2 (two) times daily. 02/23/16  Yes Tanda Rockers, MD  potassium chloride SA (K-DUR,KLOR-CON) 20 MEQ tablet Take 20 mEq by mouth every morning.    Yes Historical Provider, MD  predniSONE (DELTASONE) 10 MG tablet Take  4 each am x 2 days,   2 each am x 2 days,  1 each am x 2 days and stop Patient taking  differently: Take 20 mg by mouth daily. 2 each am x 2 days,  1 each am x 2 days and stop. ( patient's last dose 20 mg ) 02/23/16  Yes Tanda Rockers, MD  Respiratory Therapy Supplies (FLUTTER) DEVI Use as directed 02/23/16  Yes Tanda Rockers, MD  ticagrelor (BRILINTA) 90 MG TABS tablet Take 1 tablet (90 mg total) by mouth every 12 (twelve) hours. 01/26/16  Yes Eileen Stanford, PA-C  valsartan (DIOVAN) 320 MG tablet Take 1 tablet (320 mg total) by mouth daily. 01/26/16  Yes Eileen Stanford, PA-C  acetaminophen-codeine (TYLENOL #3) 300-30 MG tablet One every 4 hours as needed for cough 02/23/16   Tanda Rockers, MD  diltiazem (DILACOR XR) 180 MG 24 hr capsule Take 180 mg by mouth daily. 02/22/16   Historical Provider, MD    Family History Family History  Problem Relation Age of Onset  . Allergies Mother   . Asthma Mother   . CAD Father 26  . Diabetes Father   . Hypertension Father   . Congestive Heart Failure Sister     died in her 14s  . CAD Sister 60    Social History Social History  Substance Use  Topics  . Smoking status: Former Smoker    Packs/day: 1.00    Years: 30.00    Types: Cigarettes    Quit date: 01/09/2004  . Smokeless tobacco: Never Used  . Alcohol use No     Allergies   Amlodipine besylate; Lisinopril; and Pantoprazole sodium   Review of Systems Review of Systems  All other systems reviewed and are negative.  Physical Exam Updated Vital Signs BP 156/70   Pulse 104   Temp 98.4 F (36.9 C)   Resp 19   SpO2 97%   Physical Exam  Constitutional: She is oriented to person, place, and time. She appears well-developed and well-nourished.  HENT:  Head: Normocephalic and atraumatic.  Eyes: Conjunctivae are normal. Pupils are equal, round, and reactive to light. Right eye exhibits no discharge. Left eye exhibits no discharge. No scleral icterus.  Neck: Normal range of motion. No JVD present. No tracheal deviation present.  Pulmonary/Chest: Effort normal. No stridor. No respiratory distress. She has wheezes. She has no rales. She exhibits no tenderness.  Audible wheeze in all lobes  Musculoskeletal: She exhibits no edema.  Minor lower extremity edema bilateral, slightly worse on the right  Neurological: She is alert and oriented to person, place, and time. Coordination normal.  Psychiatric: She has a normal mood and affect. Her behavior is normal. Judgment and thought content normal.  Nursing note and vitals reviewed.   ED Treatments / Results  Labs (all labs ordered are listed, but only abnormal results are displayed) Labs Reviewed  CBC WITH DIFFERENTIAL/PLATELET - Abnormal; Notable for the following:       Result Value   WBC 14.4 (*)    RBC 3.29 (*)    Hemoglobin 10.1 (*)    HCT 31.9 (*)    Neutro Abs 9.0 (*)    Lymphs Abs 4.2 (*)    Monocytes Absolute 1.1 (*)    All other components within normal limits  BASIC METABOLIC PANEL - Abnormal; Notable for the following:    Potassium 2.8 (*)    Glucose, Bld 160 (*)    Creatinine, Ser 1.04 (*)    Calcium  8.0 (*)    GFR calc non Af Amer 54 (*)    All other components within normal limits  BRAIN NATRIURETIC PEPTIDE  Randolm Idol, ED    EKG  EKG Interpretation  Date/Time:  Sunday February 27 2016 11:49:13 EDT Ventricular Rate:  102 PR Interval:    QRS Duration: 82 QT Interval:  333 QTC Calculation: 434 R Axis:   7 Text Interpretation:  Sinus rhythm Confirmed by Stark Jock  MD, DOUGLAS ([id number]) on 02/27/2016 12:10:29 PM       Radiology Dg Chest 2 View  Result Date: 02/27/2016 CLINICAL DATA:  Shortness of breath and wheezing since this morning. Chest tightness. History of asthma and pneumonia. EXAM: CHEST  2 VIEW COMPARISON:  01/16/2016. FINDINGS: Normal sized heart. Minimal left basilar atelectasis. Otherwise, clear lungs. Mild diffuse peribronchial thickening. Mild lower thoracic spine degenerative changes. IMPRESSION: Mild bronchitic changes and minimal left basilar atelectasis. Electronically Signed   By: Claudie Revering M.D.   On: 02/27/2016 13:26    Procedures Procedures (including critical care time)  Medications Ordered in ED Medications  potassium chloride 10 mEq in 100 mL IVPB (10 mEq Intravenous New Bag/Given 02/27/16 1328)  albuterol (PROVENTIL,VENTOLIN) solution continuous neb (not administered)  potassium chloride SA (K-DUR,KLOR-CON) CR tablet 40 mEq (not administered)  albuterol (PROVENTIL) (2.5 MG/3ML) 0.083% nebulizer solution 5 mg (5 mg Nebulization Given by Other 02/27/16 1017)  methylPREDNISolone sodium succinate (SOLU-MEDROL) 125 mg/2 mL injection 125 mg (125 mg Intravenous Given 02/27/16 1005)  albuterol (PROVENTIL,VENTOLIN) solution continuous neb (10 mg/hr Nebulization Given 02/27/16 1006)  magnesium sulfate IVPB 2 g 50 mL (0 g Intravenous Stopped 02/27/16 1328)     Initial Impression / Assessment and Plan / ED Course  I have reviewed the triage vital signs and the nursing notes.  Pertinent labs & imaging results that were available during my care of the  patient were reviewed by me and considered in my medical decision making (see chart for details).  Clinical Course     Final Clinical Impressions(s) / ED Diagnoses   Final diagnoses:  Exacerbation of asthma, unspecified asthma severity, unspecified whether persistent    Labs:  Imaging:  Consults:  Therapeutics:  Discharge Meds:   Assessment/Plan:  65 year old female presents today with asthma exacerbation. She has very reassuring vital signs, but has audible wheeze. She will be started on albuterol, Atrovent and then given a dose of Solu-Medrol.  Patient also having chest pain, troponin ordered, EKG, chest x-ray.   Reevaluation of patient shows that she still having audible wheeze despite albuterol therapy, reports that several minutes ago she was not having any wheeze, but has returned again.  2:22 PM PT continues to have wheeze despite several nebulizer treatments, Solu-Medrol, magnesium. Patient also noted to be hypokalemic here. I suspect part of hypokalemia is due to nebulized breathing treatment here, she will be given her on a potassium and oral potassium. I have low suspicion for bacterial infection this patient, she does have mild bronchitic changes on her chest x-ray. Patient was not originally tachycardic, became tachycardic after breathing treatment, I have low suspicion for pulmonary embolism in this patient as today's presentation is similar to previous and recurrence of most recent exacerbation. Patient has maintained adequate oxygen saturation, but due to work of breathing and persistent wheeze despite home steroids, multiple nebulized treatments patient will likely need hospital stay for continued steroids, repeat treatments. Hospitalist service will be consulted.      New Prescriptions New Prescriptions   No medications on file     Okey Regal, PA-C 02/27/16 [place], MD 03/02/16 1318

## 2016-02-27 NOTE — Assessment & Plan Note (Signed)
-   CT sinus 08/06/14 > neg  - 08/27/2014 rec keep duler 100 on hand to use for any flare  - FENO 01/24/2016  =   13 on symb 160 2bid with persistent cough > changed to dulera 100 - 01/24/2016  After extensive coaching HFA effectiveness =    75%  - Allergy profile 02/23/16  >  Eos 0.0 /  IgE  87 pos  RAST  Grass/ trees   Symptoms are markedly disproportionate to objective findings and not clear this is a lung problem but pt does appear to have difficult airway management issues. DDX of  difficult airways management almost all start with A and  include Adherence, Ace Inhibitors, Acid Reflux, Active Sinus Disease, Alpha 1 Antitripsin deficiency, Anxiety masquerading as Airways dz,  ABPA,  Allergy(esp in young), Aspiration (esp in elderly), Adverse effects of meds,  Active smokers, A bunch of PE's (a small clot burden can't cause this syndrome unless there is already severe underlying pulm or vascular dz with poor reserve) plus two Bs  = Bronchiectasis and Beta blocker use..and one C= CHF  .Adherence is always the initial "prime suspect" and is a multilayered concern that requires a "trust but verify" approach in every patient - starting with knowing how to use medications, especially inhalers, correctly, keeping up with refills and understanding the fundamental difference between maintenance and prns vs those medications only taken for a very short course and then stopped and not refilled.  - need trust but verify approach here - - The proper method of use, as well as anticipated side effects, of a metered-dose inhaler are discussed and demonstrated to the patient. Improved effectiveness after extensive coaching during this visit to a level of approximately 75 % from a baseline of 50  % > retry dulera 100 2bid with one sample only until returns in 2 weeks   ? Acid (or non-acid) GERD > always difficult to exclude as up to 75% of pts in some series report no assoc GI/ Heartburn symptoms> rec max (24h)  acid  suppression and diet restrictions/ reviewed and instructions given in writing.   ? Anxiety > usually at the bottom of this list of usual suspects but should be much higher on this pt's based on H and P and note already on psychotropics .    I had an extended discussion with the patient reviewing all relevant studies completed to date and  lasting 15 to 20 minutes of a 25 minute visit    Each maintenance medication was reviewed in detail including most importantly the difference between maintenance and prns and under what circumstances the prns are to be triggered using an action plan format that is not reflected in the computer generated alphabetically organized AVS.    Please see instructions for details which were reviewed in writing and the patient given a copy highlighting the part that I personally wrote and discussed at today's ov.

## 2016-02-28 ENCOUNTER — Telehealth: Payer: Self-pay | Admitting: Neurology

## 2016-02-28 ENCOUNTER — Ambulatory Visit: Payer: Medicare Other | Admitting: Neurology

## 2016-02-28 ENCOUNTER — Observation Stay (HOSPITAL_BASED_OUTPATIENT_CLINIC_OR_DEPARTMENT_OTHER): Payer: Medicare Other

## 2016-02-28 DIAGNOSIS — Z7984 Long term (current) use of oral hypoglycemic drugs: Secondary | ICD-10-CM | POA: Diagnosis not present

## 2016-02-28 DIAGNOSIS — E785 Hyperlipidemia, unspecified: Secondary | ICD-10-CM | POA: Diagnosis present

## 2016-02-28 DIAGNOSIS — M7989 Other specified soft tissue disorders: Secondary | ICD-10-CM

## 2016-02-28 DIAGNOSIS — E1140 Type 2 diabetes mellitus with diabetic neuropathy, unspecified: Secondary | ICD-10-CM | POA: Diagnosis not present

## 2016-02-28 DIAGNOSIS — Z7982 Long term (current) use of aspirin: Secondary | ICD-10-CM | POA: Diagnosis not present

## 2016-02-28 DIAGNOSIS — E1165 Type 2 diabetes mellitus with hyperglycemia: Secondary | ICD-10-CM

## 2016-02-28 DIAGNOSIS — I5032 Chronic diastolic (congestive) heart failure: Secondary | ICD-10-CM | POA: Diagnosis present

## 2016-02-28 DIAGNOSIS — I10 Essential (primary) hypertension: Secondary | ICD-10-CM | POA: Diagnosis not present

## 2016-02-28 DIAGNOSIS — R0602 Shortness of breath: Secondary | ICD-10-CM | POA: Diagnosis present

## 2016-02-28 DIAGNOSIS — I110 Hypertensive heart disease with heart failure: Secondary | ICD-10-CM | POA: Diagnosis present

## 2016-02-28 DIAGNOSIS — I2510 Atherosclerotic heart disease of native coronary artery without angina pectoris: Secondary | ICD-10-CM | POA: Diagnosis present

## 2016-02-28 DIAGNOSIS — F418 Other specified anxiety disorders: Secondary | ICD-10-CM | POA: Diagnosis present

## 2016-02-28 DIAGNOSIS — G8929 Other chronic pain: Secondary | ICD-10-CM | POA: Diagnosis present

## 2016-02-28 DIAGNOSIS — K219 Gastro-esophageal reflux disease without esophagitis: Secondary | ICD-10-CM | POA: Diagnosis present

## 2016-02-28 DIAGNOSIS — Z8249 Family history of ischemic heart disease and other diseases of the circulatory system: Secondary | ICD-10-CM | POA: Diagnosis not present

## 2016-02-28 DIAGNOSIS — Z86711 Personal history of pulmonary embolism: Secondary | ICD-10-CM | POA: Diagnosis not present

## 2016-02-28 DIAGNOSIS — Z79899 Other long term (current) drug therapy: Secondary | ICD-10-CM | POA: Diagnosis not present

## 2016-02-28 DIAGNOSIS — Z87891 Personal history of nicotine dependence: Secondary | ICD-10-CM | POA: Diagnosis not present

## 2016-02-28 DIAGNOSIS — I252 Old myocardial infarction: Secondary | ICD-10-CM | POA: Diagnosis not present

## 2016-02-28 DIAGNOSIS — J45901 Unspecified asthma with (acute) exacerbation: Principal | ICD-10-CM

## 2016-02-28 DIAGNOSIS — E876 Hypokalemia: Secondary | ICD-10-CM | POA: Diagnosis present

## 2016-02-28 DIAGNOSIS — Z833 Family history of diabetes mellitus: Secondary | ICD-10-CM | POA: Diagnosis not present

## 2016-02-28 DIAGNOSIS — Z825 Family history of asthma and other chronic lower respiratory diseases: Secondary | ICD-10-CM | POA: Diagnosis not present

## 2016-02-28 LAB — BASIC METABOLIC PANEL
ANION GAP: 8 (ref 5–15)
BUN: 19 mg/dL (ref 6–20)
CALCIUM: 8.1 mg/dL — AB (ref 8.9–10.3)
CO2: 22 mmol/L (ref 22–32)
Chloride: 108 mmol/L (ref 101–111)
Creatinine, Ser: 1.17 mg/dL — ABNORMAL HIGH (ref 0.44–1.00)
GFR calc Af Amer: 55 mL/min — ABNORMAL LOW (ref >60)
GFR, EST NON AFRICAN AMERICAN: 47 mL/min — AB (ref >60)
GLUCOSE: 209 mg/dL — AB (ref 65–99)
POTASSIUM: 4.4 mmol/L (ref 3.5–5.1)
SODIUM: 138 mmol/L (ref 135–145)

## 2016-02-28 LAB — CBC
HCT: 31.6 % — ABNORMAL LOW (ref 36.0–46.0)
HEMOGLOBIN: 10.1 g/dL — AB (ref 12.0–15.0)
MCH: 30.3 pg (ref 26.0–34.0)
MCHC: 32 g/dL (ref 30.0–36.0)
MCV: 94.9 fL (ref 78.0–100.0)
Platelets: 350 10*3/uL (ref 150–400)
RBC: 3.33 MIL/uL — ABNORMAL LOW (ref 3.87–5.11)
RDW: 15.1 % (ref 11.5–15.5)
WBC: 17 10*3/uL — AB (ref 4.0–10.5)

## 2016-02-28 LAB — GLUCOSE, CAPILLARY
GLUCOSE-CAPILLARY: 200 mg/dL — AB (ref 65–99)
Glucose-Capillary: 182 mg/dL — ABNORMAL HIGH (ref 65–99)
Glucose-Capillary: 227 mg/dL — ABNORMAL HIGH (ref 65–99)
Glucose-Capillary: 157 mg/dL — ABNORMAL HIGH (ref 65–99)

## 2016-02-28 MED ORDER — METHYLPREDNISOLONE SODIUM SUCC 125 MG IJ SOLR
60.0000 mg | Freq: Four times a day (QID) | INTRAMUSCULAR | Status: DC
  Administered 2016-02-28 – 2016-02-29 (×3): 60 mg via INTRAVENOUS
  Filled 2016-02-28 (×3): qty 2

## 2016-02-28 MED ORDER — METHYLPREDNISOLONE SODIUM SUCC 125 MG IJ SOLR: 80.0000 mg | Freq: Four times a day (QID) | INTRAMUSCULAR | Status: DC

## 2016-02-28 MED ORDER — IPRATROPIUM-ALBUTEROL 0.5-2.5 (3) MG/3ML IN SOLN
3.0000 mL | RESPIRATORY_TRACT | Status: DC
  Administered 2016-02-28 – 2016-03-01 (×9): 3 mL via RESPIRATORY_TRACT
  Filled 2016-02-28 (×11): qty 3

## 2016-02-28 MED ORDER — IPRATROPIUM-ALBUTEROL 0.5-2.5 (3) MG/3ML IN SOLN
3.0000 mL | Freq: Three times a day (TID) | RESPIRATORY_TRACT | Status: DC
  Administered 2016-02-28: 3 mL via RESPIRATORY_TRACT
  Filled 2016-02-28: qty 3

## 2016-02-28 NOTE — Telephone Encounter (Signed)
Pt is unable to make OV this morning that is scheduled w/ Dr. Krista Blue. She is currently admitted @ [facility] for asthma exacerbation. Reviewed EEG results from 02/25/16 w/ pt: This is a  normal  awake EEG.  There is no electrodiagnostic evidence of epileptiform discharge. She plans on keeping MRI appt on Wed. Advised to r/s today's appt for after MRI to review results at that time. Verbalized understanding and appreciation for call.

## 2016-02-28 NOTE — Telephone Encounter (Signed)
Pt called to advise she is in the hospital and will not be released today. She has an appt today at 11:00. She is also requesting EEG results. Pt has an MRI scheduled for Wed at clinic, she sts she will be discharged from the hospital before then and is wanting to know if she should keep that appt. Please call

## 2016-02-28 NOTE — Progress Notes (Signed)
Preliminary results by tech: Bilateral lower extremity venous duplex complete. Bilateral lower extremities negative for deep vein thrombosis in visualized vessels. Negative for bakers cysts bilaterally. Morrison, RVT 1:58 PM  02/28/2016

## 2016-02-28 NOTE — Progress Notes (Signed)
Nutrition Brief Note  Patient identified on the Malnutrition Screening Tool (MST) Report  Wt Readings from Last 15 Encounters:  02/27/16 170 lb (77.1 kg)  02/23/16 168 lb 12.8 oz (76.6 kg)  01/27/16 169 lb (76.7 kg)  01/26/16 167 lb 12.8 oz (76.1 kg)  01/24/16 171 lb (77.6 kg)  01/21/16 171 lb 11.2 oz (77.9 kg)  08/12/15 167 lb (75.8 kg)  07/21/15 163 lb 12.8 oz (74.3 kg)  02/23/15 155 lb (70.3 kg)  08/27/14 156 lb 12.8 oz (71.1 kg)  08/08/14 162 lb 4.8 oz (73.6 kg)  04/09/14 155 lb (70.3 kg)  02/09/14 154 lb (69.9 kg)  03/09/12 164 lb (74.4 kg)  08/11/11 166 lb (75.3 kg)   [name] is a 65 y.o. female with past medical history significant for anxiety, asthma, heart failure, diabetes, or cortical infarction and PE presented to the emergency room chief complaint of shortness of breath. Patient states about 4 days ago was short of breath when to see her pulmonologist. She was put on scheduled inhalers along with steroids. She is currently on day for the taper does not feel any better. Since it's the weekend she came to the emergency room for evaluation.  Spoke with pt at bedside, who reports good appetite. She shares that she has been following a healthier diet (low sodium, carb modified). She denies any weight loss.   Nutrition-Focused physical exam completed. Findings are no fat depletion, no muscle depletion, and no edema.   Body mass index is 31.09 kg/m. Patient meets criteria for obesity, class I based on current BMI.   Current diet order is heart healthy/ carb modified, patient is consuming approximately 75-100% of meals at this time. Labs and medications reviewed.   No nutrition interventions warranted at this time. If nutrition issues arise, please consult RD.   Kenza Munar A. Jimmye Norman, RD, LDN, CDE Pager: [phone] After hours Pager: [phone]

## 2016-02-28 NOTE — Progress Notes (Signed)
PROGRESS NOTE    [name]  [id number] DOB: 21-Jan-1951 DOA: 02/27/2016 PCP: [name], MD   Outpatient Specialists:    Brief Narrative:  [name] is a 65 y.o. female with past medical history significant for anxiety, asthma, heart failure, diabetes, or cortical infarction and PE presented to the emergency room chief complaint of shortness of breath. Patient states about 4 days ago was short of breath when to see her pulmonologist. She was put on scheduled inhalers along with steroids. She is currently on day for the taper does not feel any better. Since it's the weekend she came to the emergency room for evaluation.  Patient has had upper respiratory tract infection symptoms for about a week. She states that this is not typical trigger for asthma exacerbation for her. States her typical trigger is seasonal allergies. Patient is not on any medication for seasonal allergies. Patient does not smoke.    Assessment & Plan:   Principal Problem:   Asthma exacerbation Active Problems:   Diabetes mellitus type 2, uncontrolled (HCC)   Depression with anxiety   Essential hypertension   HLD (hyperlipidemia)   Right leg swelling    Wheeze/vdc/dtca asthma Duoneb sch- q 4 hours When necessary albuterol Oxygen therapy Continuous pulse oximetry IV steroids Venous Doppler right leg D dimer negative  DM Hold metformin SSI- on steroids  Chronic grade 2 diastolic CHF/CAD Continue Bystolic, diltiazem, lasix, diovan->avapro, brilinta, asa, daily kdur  Chronic pain Continue gabapentin  Hypertension When necessary hydralazine 10 mg IV as needed for severe blood pressure  Hyperlipidemia Continue statin  Hypokalemia -repleted  GERD protonix  Depression w/ anxiety No SI/HI Depakote cont Prn xanax  Hyperlipidemia Continue statin   DVT prophylaxis:  Lovenox   Code Status: Full Code   Family Communication: patient  Disposition Plan:    Suspect will need a few more days of IV Steroids   Consultants:        Subjective: Still wheezing  Objective: Vitals:   02/27/16 2253 02/27/16 2313 02/28/16 0800 02/28/16 0950  BP: (!) 160/62  (!) 165/75 (!) 169/74  Pulse: 93  84 88  Resp: 20     Temp: 97.6 F (36.4 C)  98.4 F (36.9 C)   TempSrc: Oral  Oral   SpO2: 100%  100%   Weight:  77.1 kg (170 lb)    Height:  5\' 2"  (1.575 m)      Intake/Output Summary (Last 24 hours) at 02/28/16 1019 Last data filed at 02/28/16 [id number]  Gross per 24 hour  Intake           162.33 ml  Output             1000 ml  Net          -837.67 ml   Filed Weights   02/27/16 2313  Weight: 77.1 kg (170 lb)    Examination:  General exam: mild respiratory distress Respiratory system: wheezing b/l-- getting breathing treatment Cardiovascular system: S1 & S2 heard, RRR. No JVD, murmurs, rubs, gallops or clicks. No pedal edema. Gastrointestinal system: Abdomen is nondistended, soft and nontender. No organomegaly or masses felt. Normal bowel sounds heard. Central nervous system: Alert and oriented. No focal neurological deficits.     Data Reviewed: I have personally reviewed following labs and imaging studies  CBC:  Recent Labs Lab 02/23/16 1122 02/27/16 1106 02/28/16 0610  WBC 7.5 14.4* 17.0*  NEUTROABS 5.3 9.0*  --   HGB 11.2* 10.1* 10.1*  HCT  33.6* 31.9* 31.6*  MCV 93.1 97.0 94.9  PLT 356.0 333 [id number]   Basic Metabolic Panel:  Recent Labs Lab 02/27/16 1106 02/28/16 0610  NA 139 138  K 2.8* 4.4  CL 105 108  CO2 23 22  GLUCOSE 160* 209*  BUN 16 19  CREATININE 1.04* 1.17*  CALCIUM 8.0* 8.1*   GFR: Estimated Creatinine Clearance: 44.9 mL/min (by C-G formula based on SCr of 1.17 mg/dL (H)). Liver Function Tests: No results for input(s): AST, ALT, ALKPHOS, BILITOT, PROT, ALBUMIN in the last 168 hours. No results for input(s): LIPASE, AMYLASE in the last 168 hours. No results for input(s): AMMONIA in the last 168  hours. Coagulation Profile: No results for input(s): INR, PROTIME in the last 168 hours. Cardiac Enzymes: No results for input(s): CKTOTAL, CKMB, CKMBINDEX, TROPONINI in the last 168 hours. BNP (last 3 results)  Recent Labs  02/23/16 1122  PROBNP 13.0   HbA1C: No results for input(s): HGBA1C in the last 72 hours. CBG:  Recent Labs Lab 02/27/16 1841 02/27/16 2254 02/28/16 0804  GLUCAP 353* 298* 182*   Lipid Profile: No results for input(s): CHOL, HDL, LDLCALC, TRIG, CHOLHDL, LDLDIRECT in the last 72 hours. Thyroid Function Tests: No results for input(s): TSH, T4TOTAL, FREET4, T3FREE, THYROIDAB in the last 72 hours. Anemia Panel: No results for input(s): VITAMINB12, FOLATE, FERRITIN, TIBC, IRON, RETICCTPCT in the last 72 hours. Urine analysis:    Component Value Date/Time   COLORURINE YELLOW 08/07/2014 1304   APPEARANCEUR CLEAR 08/07/2014 1304   LABSPEC 1.012 08/07/2014 1304   PHURINE 5.0 08/07/2014 1304   GLUCOSEU NEGATIVE 08/07/2014 1304   HGBUR NEGATIVE 08/07/2014 1304   BILIRUBINUR NEGATIVE 08/07/2014 1304   KETONESUR NEGATIVE 08/07/2014 1304   PROTEINUR NEGATIVE 08/07/2014 1304   UROBILINOGEN 0.2 08/07/2014 1304   NITRITE NEGATIVE 08/07/2014 1304   LEUKOCYTESUR NEGATIVE 08/07/2014 1304    )No results found for this or any previous visit (from the past 240 hour(s)).    Anti-infectives    None       Radiology Studies: Dg Chest 2 View  Result Date: 02/27/2016 CLINICAL DATA:  Shortness of breath and wheezing since this morning. Chest tightness. History of asthma and pneumonia. EXAM: CHEST  2 VIEW COMPARISON:  01/16/2016. FINDINGS: Normal sized heart. Minimal left basilar atelectasis. Otherwise, clear lungs. Mild diffuse peribronchial thickening. Mild lower thoracic spine degenerative changes. IMPRESSION: Mild bronchitic changes and minimal left basilar atelectasis. Electronically Signed   By: Claudie Revering M.D.   On: 02/27/2016 13:26        Scheduled  Meds: . aspirin EC  81 mg Oral q morning - 10a  . atorvastatin  80 mg Oral q1800  . diltiazem  180 mg Oral Daily  . divalproex  500 mg Oral QHS  . enoxaparin (LOVENOX) injection  40 mg Subcutaneous Q24H  . famotidine  20 mg Oral Daily  . feeding supplement (ENSURE ENLIVE)  237 mL Oral BID BM  . furosemide  20 mg Oral Daily  . gabapentin  100 mg Oral TID  . insulin aspart  0-20 Units Subcutaneous TID WC  . ipratropium-albuterol  3 mL Nebulization Q4H  . irbesartan  300 mg Oral Daily  . methylPREDNISolone (SOLU-MEDROL) injection  80 mg Intravenous Q6H  . mometasone-formoterol  2 puff Inhalation BID  . nebivolol  5 mg Oral Daily  . pantoprazole  80 mg Oral Q1200  . potassium chloride SA  20 mEq Oral q morning - 10a  . ticagrelor  90 mg  Oral Q12H   Continuous Infusions: . sodium chloride 10 mL/hr at 02/27/16 1838     LOS: 0 days    Time spent: 25 min    [place], DO [facility] Pager [phone]  If 7PM-7AM, please contact night-coverage [email or web address] Password [email or web address] 02/28/2016, 10:19 AM

## 2016-02-29 DIAGNOSIS — E785 Hyperlipidemia, unspecified: Secondary | ICD-10-CM

## 2016-02-29 LAB — GLUCOSE, CAPILLARY
GLUCOSE-CAPILLARY: 260 mg/dL — AB (ref 65–99)
GLUCOSE-CAPILLARY: 211 mg/dL — AB (ref 65–99)
Glucose-Capillary: 218 mg/dL — ABNORMAL HIGH (ref 65–99)
Glucose-Capillary: 232 mg/dL — ABNORMAL HIGH (ref 65–99)

## 2016-02-29 MED ORDER — FAMOTIDINE 20 MG PO TABS
20.0000 mg | ORAL_TABLET | Freq: Two times a day (BID) | ORAL | Status: DC
Start: 2016-02-29 — End: 2016-03-02
  Administered 2016-02-29 – 2016-03-02 (×5): 20 mg via ORAL
  Filled 2016-02-29 (×5): qty 1

## 2016-02-29 MED ORDER — METHYLPREDNISOLONE SODIUM SUCC 125 MG IJ SOLR
60.0000 mg | Freq: Two times a day (BID) | INTRAMUSCULAR | Status: AC
  Administered 2016-02-29 – 2016-03-01 (×3): 60 mg via INTRAVENOUS
  Filled 2016-02-29 (×3): qty 2

## 2016-02-29 MED ORDER — METFORMIN HCL 500 MG PO TABS
500.0000 mg | ORAL_TABLET | Freq: Two times a day (BID) | ORAL | Status: DC
Start: 2016-02-29 — End: 2016-03-02
  Administered 2016-02-29 – 2016-03-02 (×4): 500 mg via ORAL
  Filled 2016-02-29 (×4): qty 1

## 2016-02-29 MED ORDER — GUAIFENESIN ER 600 MG PO TB12
600.0000 mg | ORAL_TABLET | Freq: Two times a day (BID) | ORAL | Status: DC
  Administered 2016-02-29 – 2016-03-02 (×5): 600 mg via ORAL
  Filled 2016-02-29 (×5): qty 1

## 2016-02-29 MED ORDER — METFORMIN HCL 500 MG PO TABS: 500.0000 mg | ORAL_TABLET | Freq: Every day | ORAL | Status: DC

## 2016-02-29 NOTE — Progress Notes (Signed)
PROGRESS NOTE    [name]  [id number] DOB: Aug 21, 1950 DOA: 02/27/2016 PCP: [name], MD   Outpatient Specialists:    Brief Narrative:  [name] is a 65 y.o. female with past medical history significant for anxiety, asthma, heart failure, diabetes, or cortical infarction and PE presented to the emergency room chief complaint of shortness of breath. Patient states about 4 days ago was short of breath when to see her pulmonologist. She was put on scheduled inhalers along with steroids. She is currently on day for the taper does not feel any better. Since it's the weekend she came to the emergency room for evaluation.  Patient has had upper respiratory tract infection symptoms for about a week. She states that this is not typical trigger for asthma exacerbation for her. States her typical trigger is seasonal allergies. Patient is not on any medication for seasonal allergies. Patient does not smoke.    Assessment & Plan:   Principal Problem:   Asthma exacerbation Active Problems:   Diabetes mellitus type 2, uncontrolled ([place])   Depression with anxiety   Essential hypertension   HLD (hyperlipidemia)   Right leg swelling    Wheeze/vdc/ asthma Duoneb sch- q 4 hours When necessary albuterol Continuous pulse oximetry IV steroids- wean Venous Doppler- b/l negative D dimer negative -appears to be coming from upper airway -add mucinex  DM resume metformin SSI- on steroids  Chronic grade 2 diastolic CHF/CAD Continue Bystolic, diltiazem, lasix, diovan->avapro, brilinta, asa, daily kdur  Chronic pain Continue gabapentin  Hypertension When necessary hydralazine 10 mg IV as needed for severe blood pressure  Hyperlipidemia Continue statin  Hypokalemia -repleted  GERD protonix  Depression w/ anxiety No SI/HI Depakote cont Prn xanax  Hyperlipidemia Continue statin   DVT prophylaxis:  Lovenox   Code Status: Full Code   Family  Communication: patient  Disposition Plan:  Suspect can be d/c'd on 11/2   Consultants:        Subjective: Off O2 Walking made wheezing worse  Objective: Vitals:   02/29/16 0500 02/29/16 0534 02/29/16 0836 02/29/16 1055  BP: (!) 181/80 (!) 181/80  (!) 167/73  Pulse: 70 70  77  Resp: 18 18  18   Temp: 97.7 F (36.5 C) 97.7 F (36.5 C)    TempSrc: Oral Oral    SpO2: 100% 100% 100%   Weight:      Height:        Intake/Output Summary (Last 24 hours) at 02/29/16 1107 Last data filed at 02/29/16 0601  Gross per 24 hour  Intake              220 ml  Output              200 ml  Net               20 ml   Filed Weights   02/27/16 2313  Weight: 77.1 kg (170 lb)    Examination:  General exam: appears more comfortable Respiratory system: upper airway sounds, moving good air in lungs b/l Cardiovascular system: S1 & S2 heard, RRR. No JVD, murmurs, rubs, gallops or clicks. No pedal edema. Gastrointestinal system: Abdomen is nondistended, soft and nontender. No organomegaly or masses felt. Normal bowel sounds heard. Central nervous system: Alert and oriented. No focal neurological deficits.     Data Reviewed: I have personally reviewed following labs and imaging studies  CBC:  Recent Labs Lab 02/23/16 1122 02/27/16 1106 02/28/16 0610  WBC 7.5 14.4* 17.0*  NEUTROABS 5.3 9.0*  --   HGB 11.2* 10.1* 10.1*  HCT 33.6* 31.9* 31.6*  MCV 93.1 97.0 94.9  PLT 356.0 333 [id number]   Basic Metabolic Panel:  Recent Labs Lab 02/27/16 1106 02/28/16 0610  NA 139 138  K 2.8* 4.4  CL 105 108  CO2 23 22  GLUCOSE 160* 209*  BUN 16 19  CREATININE 1.04* 1.17*  CALCIUM 8.0* 8.1*   GFR: Estimated Creatinine Clearance: 44.9 mL/min (by C-G formula based on SCr of 1.17 mg/dL (H)). Liver Function Tests: No results for input(s): AST, ALT, ALKPHOS, BILITOT, PROT, ALBUMIN in the last 168 hours. No results for input(s): LIPASE, AMYLASE in the last 168 hours. No results for input(s):  AMMONIA in the last 168 hours. Coagulation Profile: No results for input(s): INR, PROTIME in the last 168 hours. Cardiac Enzymes: No results for input(s): CKTOTAL, CKMB, CKMBINDEX, TROPONINI in the last 168 hours. BNP (last 3 results)  Recent Labs  02/23/16 1122  PROBNP 13.0   HbA1C: No results for input(s): HGBA1C in the last 72 hours. CBG:  Recent Labs Lab 02/28/16 0804 02/28/16 1144 02/28/16 1650 02/28/16 2327 02/29/16 0730  GLUCAP 182* 227* 157* 200* 218*   Lipid Profile: No results for input(s): CHOL, HDL, LDLCALC, TRIG, CHOLHDL, LDLDIRECT in the last 72 hours. Thyroid Function Tests: No results for input(s): TSH, T4TOTAL, FREET4, T3FREE, THYROIDAB in the last 72 hours. Anemia Panel: No results for input(s): VITAMINB12, FOLATE, FERRITIN, TIBC, IRON, RETICCTPCT in the last 72 hours. Urine analysis:    Component Value Date/Time   COLORURINE YELLOW 08/07/2014 1304   APPEARANCEUR CLEAR 08/07/2014 1304   LABSPEC 1.012 08/07/2014 1304   PHURINE 5.0 08/07/2014 1304   GLUCOSEU NEGATIVE 08/07/2014 1304   HGBUR NEGATIVE 08/07/2014 1304   BILIRUBINUR NEGATIVE 08/07/2014 1304   KETONESUR NEGATIVE 08/07/2014 1304   PROTEINUR NEGATIVE 08/07/2014 1304   UROBILINOGEN 0.2 08/07/2014 1304   NITRITE NEGATIVE 08/07/2014 1304   LEUKOCYTESUR NEGATIVE 08/07/2014 1304    )No results found for this or any previous visit (from the past 240 hour(s)).    Anti-infectives    None       Radiology Studies: Dg Chest 2 View  Result Date: 02/27/2016 CLINICAL DATA:  Shortness of breath and wheezing since this morning. Chest tightness. History of asthma and pneumonia. EXAM: CHEST  2 VIEW COMPARISON:  01/16/2016. FINDINGS: Normal sized heart. Minimal left basilar atelectasis. Otherwise, clear lungs. Mild diffuse peribronchial thickening. Mild lower thoracic spine degenerative changes. IMPRESSION: Mild bronchitic changes and minimal left basilar atelectasis. Electronically Signed   By:  Claudie Revering M.D.   On: 02/27/2016 13:26        Scheduled Meds: . aspirin EC  81 mg Oral q morning - 10a  . atorvastatin  80 mg Oral q1800  . diltiazem  180 mg Oral Daily  . divalproex  500 mg Oral QHS  . enoxaparin (LOVENOX) injection  40 mg Subcutaneous Q24H  . famotidine  20 mg Oral BID  . furosemide  20 mg Oral Daily  . gabapentin  100 mg Oral TID  . guaiFENesin  600 mg Oral BID  . insulin aspart  0-20 Units Subcutaneous TID WC  . ipratropium-albuterol  3 mL Nebulization Q4H  . irbesartan  300 mg Oral Daily  . methylPREDNISolone (SOLU-MEDROL) injection  60 mg Intravenous Q12H  . mometasone-formoterol  2 puff Inhalation BID  . nebivolol  5 mg Oral Daily  . pantoprazole  80 mg Oral Q1200  . potassium chloride  SA  20 mEq Oral q morning - 10a  . ticagrelor  90 mg Oral Q12H   Continuous Infusions: . sodium chloride 10 mL/hr at 02/27/16 1838     LOS: 1 day    Time spent: 25 min    [place], DO [facility] Pager [phone]  If 7PM-7AM, please contact night-coverage [email or web address] Password [email or web address] 02/29/2016, 11:07 AM

## 2016-03-01 ENCOUNTER — Other Ambulatory Visit: Payer: Self-pay

## 2016-03-01 LAB — GLUCOSE, CAPILLARY
GLUCOSE-CAPILLARY: 227 mg/dL — AB (ref 65–99)
GLUCOSE-CAPILLARY: 240 mg/dL — AB (ref 65–99)
GLUCOSE-CAPILLARY: 263 mg/dL — AB (ref 65–99)
Glucose-Capillary: 237 mg/dL — ABNORMAL HIGH (ref 65–99)

## 2016-03-01 MED ORDER — HYDRALAZINE HCL 25 MG PO TABS
25.0000 mg | ORAL_TABLET | Freq: Three times a day (TID) | ORAL | Status: DC
  Administered 2016-03-01 (×3): 25 mg via ORAL
  Filled 2016-03-01 (×3): qty 1

## 2016-03-01 MED ORDER — IPRATROPIUM-ALBUTEROL 0.5-2.5 (3) MG/3ML IN SOLN
3.0000 mL | Freq: Four times a day (QID) | RESPIRATORY_TRACT | Status: DC
  Administered 2016-03-01 – 2016-03-02 (×3): 3 mL via RESPIRATORY_TRACT
  Filled 2016-03-01 (×3): qty 3

## 2016-03-01 MED ORDER — PREDNISONE 50 MG PO TABS
50.0000 mg | ORAL_TABLET | Freq: Every day | ORAL | Status: DC
  Administered 2016-03-02: 50 mg via ORAL
  Filled 2016-03-01: qty 1

## 2016-03-01 NOTE — Progress Notes (Signed)
PROGRESS NOTE    [name]  [id number] DOB: February 17, 1951 DOA: 02/27/2016 PCP: [name], MD   Outpatient Specialists:    Brief Narrative:  [name] is a 65 y.o. female with past medical history significant for anxiety, asthma, heart failure, diabetes, or cortical infarction and PE presented to the emergency room chief complaint of shortness of breath. Patient states about 4 days ago was short of breath when to see her pulmonologist. She was put on scheduled inhalers along with steroids. She is currently on day for the taper does not feel any better. Since it's the weekend she came to the emergency room for evaluation.  Patient has had upper respiratory tract infection symptoms for about a week. She states that this is not typical trigger for asthma exacerbation for her. States her typical trigger is seasonal allergies. Patient is not on any medication for seasonal allergies. Patient does not smoke.    Assessment & Plan:   Principal Problem:   Asthma exacerbation Active Problems:   Diabetes mellitus type 2, uncontrolled ([place])   Depression with anxiety   Essential hypertension   HLD (hyperlipidemia)   Right leg swelling    Wheeze/vdc/ asthma Duoneb sch- q 4 hours When necessary albuterol IV steroids- wean to PO Venous Doppler- b/l negative D dimer negative -appears to be coming from upper airway -added mucinex -needs outpatient pulm follow up  DM resume metformin SSI- on steroids  Chronic grade 2 diastolic CHF/CAD Continue Bystolic, diltiazem, lasix, diovan->avapro, brilinta, asa, daily kdur  Chronic pain Continue gabapentin  Hypertension Add PO hydralazine for better control to above medications  Hyperlipidemia Continue statin  Hypokalemia -repleted  GERD pepcid BID  Depression w/ anxiety No SI/HI Depakote cont Prn xanax  Hyperlipidemia Continue statin   DVT prophylaxis:  Lovenox   Code Status: Full  Code   Family Communication: patient  Disposition Plan:  Suspect can be d/c'd on 11/2   Consultants:        Subjective: Cough looser  Objective: Vitals:   02/29/16 1416 02/29/16 1524 03/01/16 0541 03/01/16 0745  BP: (!) 155/79  (!) 181/77 (!) 176/79  Pulse: 73  64 66  Resp: 19  18 18   Temp: 97.7 F (36.5 C)  98.2 F (36.8 C) 97.7 F (36.5 C)  TempSrc: Oral  Oral Oral  SpO2:  100%    Weight:      Height:        Intake/Output Summary (Last 24 hours) at 03/01/16 0851 Last data filed at 03/01/16 [id number]  Gross per 24 hour  Intake              840 ml  Output                0 ml  Net              840 ml   Filed Weights   02/27/16 2313  Weight: 77.1 kg (170 lb)    Examination:  General exam: appears more comfortable Respiratory system: upper airway sounds, moving good air in lungs b/l- mild exp wheezing Cardiovascular system: S1 & S2 heard, RRR. No JVD, murmurs, rubs, gallops or clicks. No pedal edema. Gastrointestinal system: Abdomen is nondistended, soft and nontender. No organomegaly or masses felt. Normal bowel sounds heard. Central nervous system: Alert and oriented. No focal neurological deficits.     Data Reviewed: I have personally reviewed following labs and imaging studies  CBC:  Recent Labs Lab 02/23/16 1122 02/27/16 1106 02/28/16 0610  WBC 7.5 14.4* 17.0*  NEUTROABS 5.3 9.0*  --   HGB 11.2* 10.1* 10.1*  HCT 33.6* 31.9* 31.6*  MCV 93.1 97.0 94.9  PLT 356.0 333 [id number]   Basic Metabolic Panel:  Recent Labs Lab 02/27/16 1106 02/28/16 0610  NA 139 138  K 2.8* 4.4  CL 105 108  CO2 23 22  GLUCOSE 160* 209*  BUN 16 19  CREATININE 1.04* 1.17*  CALCIUM 8.0* 8.1*   GFR: Estimated Creatinine Clearance: 44.9 mL/min (by C-G formula based on SCr of 1.17 mg/dL (H)). Liver Function Tests: No results for input(s): AST, ALT, ALKPHOS, BILITOT, PROT, ALBUMIN in the last 168 hours. No results for input(s): LIPASE, AMYLASE in the last 168  hours. No results for input(s): AMMONIA in the last 168 hours. Coagulation Profile: No results for input(s): INR, PROTIME in the last 168 hours. Cardiac Enzymes: No results for input(s): CKTOTAL, CKMB, CKMBINDEX, TROPONINI in the last 168 hours. BNP (last 3 results)  Recent Labs  02/23/16 1122  PROBNP 13.0   HbA1C: No results for input(s): HGBA1C in the last 72 hours. CBG:  Recent Labs Lab 02/29/16 0730 02/29/16 1229 02/29/16 1752 02/29/16 2056 03/01/16 0706  GLUCAP 218* 232* 260* 211* 227*   Lipid Profile: No results for input(s): CHOL, HDL, LDLCALC, TRIG, CHOLHDL, LDLDIRECT in the last 72 hours. Thyroid Function Tests: No results for input(s): TSH, T4TOTAL, FREET4, T3FREE, THYROIDAB in the last 72 hours. Anemia Panel: No results for input(s): VITAMINB12, FOLATE, FERRITIN, TIBC, IRON, RETICCTPCT in the last 72 hours. Urine analysis:    Component Value Date/Time   COLORURINE YELLOW 08/07/2014 1304   APPEARANCEUR CLEAR 08/07/2014 1304   LABSPEC 1.012 08/07/2014 1304   PHURINE 5.0 08/07/2014 1304   GLUCOSEU NEGATIVE 08/07/2014 1304   HGBUR NEGATIVE 08/07/2014 1304   BILIRUBINUR NEGATIVE 08/07/2014 1304   KETONESUR NEGATIVE 08/07/2014 1304   PROTEINUR NEGATIVE 08/07/2014 1304   UROBILINOGEN 0.2 08/07/2014 1304   NITRITE NEGATIVE 08/07/2014 1304   LEUKOCYTESUR NEGATIVE 08/07/2014 1304    )No results found for this or any previous visit (from the past 240 hour(s)).    Anti-infectives    None       Radiology Studies: No results found.      Scheduled Meds: . aspirin EC  81 mg Oral q morning - 10a  . atorvastatin  80 mg Oral q1800  . diltiazem  180 mg Oral Daily  . divalproex  500 mg Oral QHS  . enoxaparin (LOVENOX) injection  40 mg Subcutaneous Q24H  . famotidine  20 mg Oral BID  . furosemide  20 mg Oral Daily  . gabapentin  100 mg Oral TID  . guaiFENesin  600 mg Oral BID  . hydrALAZINE  25 mg Oral Q8H  . insulin aspart  0-20 Units Subcutaneous TID  WC  . ipratropium-albuterol  3 mL Nebulization Q4H  . irbesartan  300 mg Oral Daily  . metFORMIN  500 mg Oral BID WC  . methylPREDNISolone (SOLU-MEDROL) injection  60 mg Intravenous Q12H  . mometasone-formoterol  2 puff Inhalation BID  . nebivolol  5 mg Oral Daily  . pantoprazole  80 mg Oral Q1200  . potassium chloride SA  20 mEq Oral q morning - 10a  . [START ON 03/02/2016] predniSONE  50 mg Oral Q breakfast  . ticagrelor  90 mg Oral Q12H   Continuous Infusions: . sodium chloride 10 mL/hr at 02/27/16 1838     LOS: 2 days    Time spent: 25 min  [place], DO [facility] Pager [phone]  If 7PM-7AM, please contact night-coverage [email or web address] Password [email or web address] 03/01/2016, 8:51 AM

## 2016-03-02 ENCOUNTER — Inpatient Hospital Stay (HOSPITAL_COMMUNITY): Admission: RE | Admit: 2016-03-02 | Payer: Self-pay | Source: Ambulatory Visit

## 2016-03-02 LAB — GLUCOSE, CAPILLARY
GLUCOSE-CAPILLARY: 210 mg/dL — AB (ref 65–99)
Glucose-Capillary: 232 mg/dL — ABNORMAL HIGH (ref 65–99)

## 2016-03-02 MED ORDER — GUAIFENESIN ER 600 MG PO TB12: 600.0000 mg | ORAL_TABLET | Freq: Two times a day (BID) | ORAL | 0 refills | Status: DC

## 2016-03-02 MED ORDER — PREDNISONE 10 MG PO TABS: ORAL_TABLET | ORAL | 0 refills | Status: DC

## 2016-03-02 MED ORDER — HYDRALAZINE HCL 25 MG PO TABS: 25.0000 mg | ORAL_TABLET | Freq: Three times a day (TID) | ORAL | 0 refills | Status: DC

## 2016-03-02 MED ORDER — METFORMIN HCL 500 MG PO TABS: 500.0000 mg | ORAL_TABLET | Freq: Two times a day (BID) | ORAL | 0 refills | Status: DC

## 2016-03-02 MED ORDER — HYDRALAZINE HCL 20 MG/ML IJ SOLN
5.0000 mg | Freq: Once | INTRAMUSCULAR | Status: AC
  Administered 2016-03-02: 5 mg via INTRAVENOUS
  Filled 2016-03-02: qty 1

## 2016-03-02 NOTE — Progress Notes (Signed)
Patient and son provided with discharge instructions, both verbalized understanding, IV removed, belongings accounted for, prescriptions given to the patient, and volunteer services escorting out.   Betha Loa Kourtnee Lahey, RN

## 2016-03-02 NOTE — Progress Notes (Signed)
Paged MD to make aware of BP still 182/67 after IV hydralazine given. Provider advised to give 10 am meds and to monitor. Betha Loa Gehrig Patras, RN

## 2016-03-02 NOTE — Discharge Summary (Signed)
Physician Discharge Summary  Jacqueline ARMSTEAD [id number] DOB: [birth year] DOA: 02/27/2016  PCP: [name], MD  Admit date: 02/27/2016 Discharge date: 03/02/2016   Recommendations for Outpatient Follow-Up:   1. Needs further titration of BP as outpatient 2. Needs close pulmonary follow up   Discharge Diagnosis:   Principal Problem:   Asthma exacerbation Active Problems:   Diabetes mellitus type 2, uncontrolled ([place])   Depression with anxiety   Essential hypertension   HLD (hyperlipidemia)   Right leg swelling   Discharge disposition:  Home.  Discharge Condition: Improved.  Diet recommendation: Low sodium, heart healthy/carb mod  Wound care: None.   History of Present Illness:   [name] is a 65 y.o. female with past medical history significant for anxiety, asthma, heart failure, diabetes, or cortical infarction and PE presented to the emergency room chief complaint of shortness of breath. Patient states about 4 days ago was short of breath when to see her pulmonologist. She was put on scheduled inhalers along with steroids. She is currently on day for the taper does not feel any better. Since it's the weekend she came to the emergency room for evaluation.  Patient has had upper respiratory tract infection symptoms for about a week. She states that this is not typical trigger for asthma exacerbation for her. States her typical trigger is seasonal allergies. Patient is not on any medication for seasonal allergies. Patient does not smoke.   Hospital Course by Problem:   Wheeze/vdc/ asthma Duonebsch- q 4 hours When necessary albuterol IV steroids- wean to PO Venous Doppler- b/l negative D dimer negative -appears to be coming from upper airway -added mucinex -needs outpatient pulm follow up for VCD  DM resume metformin  Chronic grade 2 diastolic CHF/CAD Continue Bystolic, diltiazem, lasix, diovan->avapro, brilinta, asa, daily kdur  Chronic  pain Continue gabapentin  Hypertension Added PO hydralazine for better control to above medications will need further outpatient titration ? If using too small cuff  Hyperlipidemia Continue statin  Hypokalemia -repleted  GERD pepcid BID  Depression w/ anxiety No SI/HI Depakote cont Prn xanax  Hyperlipidemia Continue statin    Medical Consultants:    None.   Discharge Exam:   Vitals:   03/02/16 0518 03/02/16 0706  BP: (!) 189/70 (!) 182/67  Pulse: 71 71  Resp:    Temp:     Vitals:   03/01/16 2126 03/02/16 0517 03/02/16 0518 03/02/16 0706  BP: 133/60 (!) 190/82 (!) 189/70 (!) 182/67  Pulse: 73 70 71 71  Resp: 18 18    Temp: 97.5 F (36.4 C) 98.3 F (36.8 C)    TempSrc: Oral Oral    SpO2: 100% 100% 100%   Weight:      Height:        Gen:  NAD    The results of significant diagnostics from this hospitalization (including imaging, microbiology, ancillary and laboratory) are listed below for reference.     Procedures and Diagnostic Studies:   Dg Chest 2 View  Result Date: 02/27/2016 CLINICAL DATA:  Shortness of breath and wheezing since this morning. Chest tightness. History of asthma and pneumonia. EXAM: CHEST  2 VIEW COMPARISON:  01/16/2016. FINDINGS: Normal sized heart. Minimal left basilar atelectasis. Otherwise, clear lungs. Mild diffuse peribronchial thickening. Mild lower thoracic spine degenerative changes. IMPRESSION: Mild bronchitic changes and minimal left basilar atelectasis. Electronically Signed   By: Claudie Revering M.D.   On: 02/27/2016 13:26     Labs:   Basic Metabolic Panel:  Recent Labs Lab 02/27/16 1106 02/28/16 0610  NA 139 138  K 2.8* 4.4  CL 105 108  CO2 23 22  GLUCOSE 160* 209*  BUN 16 19  CREATININE 1.04* 1.17*  CALCIUM 8.0* 8.1*   GFR Estimated Creatinine Clearance: 44.9 mL/min (by C-G formula based on SCr of 1.17 mg/dL (H)). Liver Function Tests: No results for input(s): AST, ALT, ALKPHOS, BILITOT,  PROT, ALBUMIN in the last 168 hours. No results for input(s): LIPASE, AMYLASE in the last 168 hours. No results for input(s): AMMONIA in the last 168 hours. Coagulation profile No results for input(s): INR, PROTIME in the last 168 hours.  CBC:  Recent Labs Lab 02/27/16 1106 02/28/16 0610  WBC 14.4* 17.0*  NEUTROABS 9.0*  --   HGB 10.1* 10.1*  HCT 31.9* 31.6*  MCV 97.0 94.9  PLT 333 350   Cardiac Enzymes: No results for input(s): CKTOTAL, CKMB, CKMBINDEX, TROPONINI in the last 168 hours. BNP: Invalid input(s): POCBNP CBG:  Recent Labs Lab 03/01/16 0706 03/01/16 1230 03/01/16 1706 03/01/16 2127 03/02/16 0753  GLUCAP 227* 240* 263* 237* 232*   D-Dimer No results for input(s): DDIMER in the last 72 hours. Hgb A1c No results for input(s): HGBA1C in the last 72 hours. Lipid Profile No results for input(s): CHOL, HDL, LDLCALC, TRIG, CHOLHDL, LDLDIRECT in the last 72 hours. Thyroid function studies No results for input(s): TSH, T4TOTAL, T3FREE, THYROIDAB in the last 72 hours.  Invalid input(s): FREET3 Anemia work up No results for input(s): VITAMINB12, FOLATE, FERRITIN, TIBC, IRON, RETICCTPCT in the last 72 hours. Microbiology No results found for this or any previous visit (from the past 240 hour(s)).   Discharge Instructions:   Discharge Instructions    Diet - low sodium heart healthy    Complete by:  As directed    Diet Carb Modified    Complete by:  As directed    Discharge instructions    Complete by:  As directed    Need to keep follow up with Dr. Melvyn Novas   Increase activity slowly    Complete by:  As directed        Medication List    TAKE these medications   acetaminophen-codeine 300-30 MG tablet Commonly known as:  TYLENOL #3 One every 4 hours as needed for cough   albuterol (2.5 MG/3ML) 0.083% nebulizer solution Commonly known as:  PROVENTIL Take 3 mLs (2.5 mg total) by nebulization every 4 (four) hours as needed for shortness of breath.    albuterol 108 (90 Base) MCG/ACT inhaler Commonly known as:  PROVENTIL HFA;VENTOLIN HFA Inhale 2 puffs into the lungs every 6 (six) hours as needed for wheezing or shortness of breath.   ALPRAZolam 0.5 MG tablet Commonly known as:  XANAX Take 1 tablet (0.5 mg total) by mouth 2 (two) times daily as needed for anxiety.   aspirin EC 81 MG tablet Take 81 mg by mouth every morning.   atorvastatin 80 MG tablet Commonly known as:  LIPITOR Take 1 tablet (80 mg total) by mouth every morning.   BYSTOLIC 5 MG tablet Generic drug:  nebivolol Take 5 mg by mouth daily.   diltiazem 180 MG 24 hr capsule Commonly known as:  DILACOR XR Take 180 mg by mouth daily.   divalproex 500 MG 24 hr tablet Commonly known as:  DEPAKOTE ER Take 1 tablet (500 mg total) by mouth at bedtime.   esomeprazole 40 MG capsule Commonly known as:  NEXIUM Take 40 mg by mouth every morning.  estradiol 1 MG tablet Commonly known as:  ESTRACE Take 1 mg by mouth every morning.   FLUTTER Devi Use as directed   furosemide 40 MG tablet Commonly known as:  LASIX Take 0.5 tablets (20 mg total) by mouth daily.   gabapentin 100 MG capsule Commonly known as:  NEURONTIN Take 1 capsule (100 mg total) by mouth 3 (three) times daily. One three times daily   guaiFENesin 600 MG 12 hr tablet Commonly known as:  MUCINEX Take 1 tablet (600 mg total) by mouth 2 (two) times daily.   hydrALAZINE 25 MG tablet Commonly known as:  APRESOLINE Take 1 tablet (25 mg total) by mouth every 8 (eight) hours.   metFORMIN 500 MG tablet Commonly known as:  GLUCOPHAGE Take 1 tablet (500 mg total) by mouth 2 (two) times daily with a meal. What changed:  when to take this   mometasone-formoterol 100-5 MCG/ACT Aero Commonly known as:  DULERA Inhale 2 puffs into the lungs 2 (two) times daily.   PEPCID 20 MG tablet Generic drug:  famotidine Take 20 mg by mouth daily.   potassium chloride SA 20 MEQ tablet Commonly known as:   K-DUR,KLOR-CON Take 20 mEq by mouth every morning.   predniSONE 10 MG tablet Commonly known as:  DELTASONE 40 mg x 3 days, 30 mg x 3 days, 20 mg x3 days, 10 mg x 3 days then stop Start taking on:  03/03/2016 What changed:  additional instructions   ticagrelor 90 MG Tabs tablet Commonly known as:  BRILINTA Take 1 tablet (90 mg total) by mouth every 12 (twelve) hours.   valsartan 320 MG tablet Commonly known as:  DIOVAN Take 1 tablet (320 mg total) by mouth daily.      Follow-up Information    [name], MD Follow up in 1 week(s).   Specialty:  Family Medicine Why:  for BP check Contact information: [place] [address] [phone]        Christinia Gully, MD .   Specialty:  Pulmonary Disease Why:  patient should have appointment already Contact information: [address] [place] [address] [phone]            Time coordinating discharge: 35 min  Signed:  JESSICA Alison Stalling   [facility] 03/02/2016, 8:57 AM

## 2016-03-02 NOTE — Care Management Note (Signed)
Case Management Note  Patient Details  Name: [name] MRN: [id number] Date of Birth: [birth year]  Subjective/Objective:            Pt presented with SOB, hx of anxiety, asthma, heart failure, diabetes, or cortical infarction and PE.    Action/Plan: Plan is to d/c to home today.  Expected Discharge Date:    03/02/2016              Expected Discharge Plan:  Home/Self Care  In-House Referral:     Discharge planning Services  CM Consult  Status of Service:  Completed, signed off  If discussed at [place] of Stay Meetings, dates discussed:    Additional Comments:  Sharin Mons, RN 03/02/2016, 12:26 PM

## 2016-03-06 ENCOUNTER — Inpatient Hospital Stay (HOSPITAL_COMMUNITY): Admission: RE | Admit: 2016-03-06 | Payer: Self-pay | Source: Ambulatory Visit

## 2016-03-08 ENCOUNTER — Ambulatory Visit (HOSPITAL_COMMUNITY): Payer: Self-pay

## 2016-03-10 ENCOUNTER — Ambulatory Visit (HOSPITAL_COMMUNITY): Payer: Self-pay

## 2016-03-10 ENCOUNTER — Encounter: Payer: Self-pay | Admitting: Adult Health

## 2016-03-10 ENCOUNTER — Ambulatory Visit (INDEPENDENT_AMBULATORY_CARE_PROVIDER_SITE_OTHER): Payer: Medicare Other | Admitting: Adult Health

## 2016-03-10 DIAGNOSIS — J4531 Mild persistent asthma with (acute) exacerbation: Secondary | ICD-10-CM

## 2016-03-10 NOTE — Patient Instructions (Addendum)
Add Zyrtec 10mg . At bedtime   Saline nasal rinses As needed   Delsym 2 tsp Twice daily  As needed  Cough  Finish Prednisone as directed.  Follow up with Dr. Melvyn Novas  In 4 weeks and As needed   Please contact office for sooner follow up if symptoms do not improve or worsen or seek emergency care  Follow up with Primary MD for blood pressure

## 2016-03-10 NOTE — Progress Notes (Signed)
Subjective:    Patient ID: [name], female    DOB: 1951/04/24,   MRN: [id number]    Brief patient profile:  54 yobf quit smoking 2005 with nl pfts 2012  with recurrent non-specific resp flares which had improved until one week pta p came down with a sinus infection "just like always"   Baseline of summer 2017  Sob x 80 k floor at work at slower pace while maint on symb 2bid s cough or need for cough meds while on nexium     Admit date: 01/16/2016  Discharge date: 01/21/2016   Home Health: None   Equipment/Devices: None  Consultations: Cards Discharge Condition: Stable   CODE STATUS: Full   Diet Recommendation: Heart Healthy Low Carb      Chief Complaint  Patient presents with  . Asthma     Brief history of present illness from the day of admission and additional interim summary    [name] a 65 y.o.femalwith medical history significant for hypertension, diabetes on metformin, history of prior PE and asthma. This past Thursday patient began having upper respiratory infection symptoms with cough and congestion with productive yellow sputum. Because of this she developed asthma exacerbation symptoms which are typical for her with increased wheezing and tightness. She is not had fevers or chills. She's not had any GI symptoms such as nausea, vomiting or diarrhea. She went to her PCP who prescribed prednisone. In addition she utilized her normal nebulizers and MDIs without improvement in her symptoms. Because of increased respiratory distress she presented to the ER today. No documentation from ER to determine if patient was hypoxemic at presentation. She was wheezing significantly upon arrival and becoming dyspneic with talking. She was also reporting pleuritic chest tightness with taking a deep breath.   She has been exposed to multiple sick contacts with similar symptoms prior to onset of her symptoms.She was positive for coronavirus which likely exacerbated  her asthma, also had NSTEMI requiring left heart cath and stent placement.   Hospital issues addressed     1. Acute on chronic asthma exacerbation. She still wheezing, continue IV steroids, added Singulair, added nebulizer treatments both scheduled and as needed, will add Dulera as well and monitor. Respiratory panel positive for coronavirus likely URI exacerbated her asthma + dCHF, SOB better, she has no oxygen requirement for the last 2 days, she still has minimal wheezing but this is mostly upper airway coming from her throat, will be placed on steroid taper, continue home nebulizer treatments and follow with Dr. Melvyn Novas her pulmonary physician in a week.  2. Hypertension. Hydralazine dose doubled, ARB + Cardizem. Follow with PCP for blood pressure control.  3. History of PE. Normal d-dimer.  4. Hypokalemia. Replaced and stable.  5. GERD. On PPI.  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 Continue statin & Cardizem, no B.Blocker due to Asthma, pain free. All with cardiology outpatient.  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  8. DM2 - continue Home rx.   Discharge diagnosis     Principal Problem:   Acute bronchitis with asthma with acute exacerbation Active Problems:   Diabetes mellitus type 2, uncontrolled ([name])   Essential hypertension  G E R D   PULMONARY EMBOLISM, HX OF   Hyperlipidemia   Chest pain   Acute hypokalemia   Hypomagnesemia   Acute respiratory failure ([facility])   NSTEMI (non-ST elevated myocardial infarction) ([facility])   CAD in native artery   Hypertensive heart disease    01/24/2016  Extended post hosp f/u ov/ transition of care/ /Wert re: dtca asthma/ vcd symb 160 and pred 5 x 10 mg  daily  Chief Complaint  Patient presents with  . Follow-up    Pt. recently got out the hopistal for an asthma attack, Pt. states her breathing remains the same, coughing with some yellow to white mucus,Has been needing to use Ventolin more often  last doing well 2 months prior to OV   While on Symb 160/ gerd rx but not dosing ac as rec  Waking up prematurely x 2 months with cough/wheeze/ sob but never contacted me as per the instructions rec Plan A = Automatic = dulera 100 Take 2 puffs first thing in am and then another 2 puffs about 12 hours later.                                      Nexum 40 mg Take 30-60 min before first meal of the day and Pepcid AC 20 mg  at bedtime Work on inhaler technique  Plan B = Backup Only use your albuterol as a rescue medication Plan C = Crisis - only use your albuterol nebulizer if you first try Plan B and it fails to help > ok to use the nebulizer up to every 4 hours but if start needing it regularly call for immediate appointment GERD diet  Take delsym two tsp every 12 hours and use the flutter valve as much as possible and supplement if needed with  tramadol 50 mg up to 2 every 4 hours to suppress the urge to cough  Once you have eliminated the cough for 3 straight days try reducing the tramadol first,  then the delsym as tolerated.   Prednisone 5 mg x 8, then 6, then 4 then 2, then 1 daily x 2 days and off  please schedule a follow up office visit in 2 weeks, sooner if needed with all meds/ inhalers/ neb solutions in hand     02/23/2016  f/u ov/Wert re: dtca vs vcd/ did not bring meds as req Chief Complaint  Patient presents with  . Follow-up    Cough had stopped and then resumed again 1 day ago. She states she woke up coughing at 2 am- non prod and also having chest tightness.   husband says throat clearing never stops x years  Some better on pred but flared w/in a few days of stopping it    >>pred taper   03/10/2016 Post hospital follow up  She  presents for a post hospital follow-up Patient was admitted October 29 through November 2 for an asthma exacerbation. [name] was treated with IV steroids, and transitioned over to prednisone. At discharge. D-dimer was negative. Venous Dopplers were negative. She is feeling some better, cough and wheezing are decreased.  Does have some post nasal drainage.  Denies chest pain, orthopnea, edema or fever.    Current Medications, Allergies, Complete Past Medical History, Past Surgical History, Family History, and Social History were reviewed in [employer] record.  ROS  The following are not active complaints  unless bolded sore throat, dysphagia, dental problems, itching, sneezing,  nasal congestion or excess/ purulent secretions, ear ache,   fever, chills, sweats, unintended wt loss, classically pleuritic or exertional cp,  orthopnea pnd or leg swelling, presyncope, palpitations, abdominal pain, anorexia, nausea, vomiting, diarrhea  or change in bowel or bladder habits, change in stools or urine, dysuria,hematuria,  rash, arthralgias, visual complaints, headache, numbness, weakness or ataxia or problems with walking or coordination,  change in mood/affect or memory.              Objective:   Physical Exam  amb bf nad    02/23/2016       168   Vitals:   03/10/16 1600  Weight: 171 lb 3.2 oz (77.7 kg)  Height: 5\' 2"  (1.575 m)     Vital signs reviewed - Note on arrival 02 sats  100% on RA     HEENT: nl dentition, turbinates, and orophanx. Nl external ear canals without cough reflex   NECK :  without JVD/Nodes/TM/ nl carotid upstrokes bilaterally   LUNGS: no acc muscle use, clear to A and P bilaterally without cough on insp or exp maneuvers   CV:  RRR  no s3 or murmur or increase in P2, no edema   ABD:  soft and nontender with nl excursion in the supine position. No bruits or organomegaly, bowel sounds nl  MS:  warm without deformities, calf tenderness,  cyanosis or clubbing  SKIN: warm and dry without lesions    NEURO:  alert, approp, no deficits     CXR:   02/27/16 Mild bronchitic changes and minimal left basilar atelectasis.  Kaniyah Lisby NP-C  [facility]  03/10/2016

## 2016-03-10 NOTE — Assessment & Plan Note (Signed)
Recent exacerbation now resolving with upper airway cough -improving  Cont trigger control   Plan  Patient Instructions  Add Zyrtec 10mg . At bedtime   Saline nasal rinses As needed   Delsym 2 tsp Twice daily  As needed  Cough  Finish Prednisone as directed.  Follow up with Dr. Melvyn Novas  In 4 weeks and As needed   Please contact office for sooner follow up if symptoms do not improve or worsen or seek emergency care  Follow up with Primary MD for blood pressure

## 2016-03-10 NOTE — Progress Notes (Signed)
Chart and office note reviewed in detail  > agree with a/p as outlined    

## 2016-03-13 ENCOUNTER — Ambulatory Visit (HOSPITAL_COMMUNITY): Payer: Self-pay

## 2016-03-13 NOTE — Progress Notes (Signed)
Cardiology Office Note    Date:  03/14/2016   ID:  [name], DOB Mar 05, 1951, MRN [id number]  PCP:  [name], MD  Cardiologist:  Dr. Marlou Porch  CC: post hospital follow up  History of Present Illness:  [name] is a 65 y.o. female with a history of asthma, DMT2, HLD, HTN, GERD, prior PE, CAD s/p DESx2 to dRCA (12/2015) and chronic diastolic CHF who presents to clinic for post hospital follow up after recent admission for asthma exacerbation.   She was admitted 9/17-9/22/17 with acute on chronic asthma exacerbation and acute bronchitis 2/2 coronavirus as well as NSTEMI. She required IV steroids for significant wheezing and respiratory distress. She also had some chest pain. Peak troponin 0.29 and ECG with diffuse ST depression. LHC 01/18/16 showed severe single vessel CAD with severe stenosis in the mid and distal RCA s/p overlapping DESx2 to RCA, 10% ost-prox Cx,10% mLAD. 2D Echo 01/18/16 showed severe LVH, EF 65-70% with grade 2 DD, high vent filling pressure, mild MR. She was also found to be mildly volume overloaded and was diuresed with IV lasix. Discharged on home Diovan HCT. She was also discharged home on ASA and Brilinta as well as a prednisone taper. No BB due to severe asthma.  I saw her in the office for post hospital follow up on 01/26/16. She was having some orthopnea and LE edema. I stopped Diovan HCT and started her on Valsartan 320mg  daily and lasix 20mg . Follow up BNP was normal so I didn't increase lasix any further.   She was started on Bystolic by PCP in [id number].  She recently developed another URI and was treated with scheduled inhalers and steroid taper. She ended up being admitted again from 10/29-11/2/17 for an acute asthma exacerbation. BP was elevated and hydralazine was added.   Today she presents to clinic for follow up. She woke up with a headache and painful red eye. No visual changes but photophobia. No chest pain or SOB. Sometimes gets a little  chest pain when coughing. No LE edema, orthopnea or PND. No dizziness or syncope. No blood in stool or urine.    Past Medical History:  Diagnosis Date  . Anxiety   . Asthma   . Bronchitis   . Chronic diastolic CHF (congestive heart failure) ([place])   . Coronary artery disease    a. NSTEMI 12/2015 - LHC 01/18/16: s/p overlapping DESx2 to RCA, 10% ost-prox Cx,10% mLAD.  Marland Kitchen Depression   . Diabetes mellitus   . Dyslipidemia   . GERD (gastroesophageal reflux disease)   . Heart attack   . Hypertension   . Hypertensive heart disease   . NSTEMI (non-ST elevated myocardial infarction) ([place]) 12/2015  . Pneumonia     Past Surgical History:  Procedure Laterality Date  . ABDOMINAL HYSTERECTOMY    . CARDIAC CATHETERIZATION  1994   minimal LAD dz, no other dz, EF normal  . CARDIAC CATHETERIZATION N/A 01/18/2016   Procedure: Left Heart Cath and Coronary Angiography;  Surgeon: Burnell Blanks, MD;  Location: [facility];  Service: Cardiovascular;  Laterality: N/A;  . CARDIAC CATHETERIZATION N/A 01/18/2016   Procedure: Coronary Stent Intervention;  Surgeon: Burnell Blanks, MD;  Location: [facility];  Service: Cardiovascular;  Laterality: N/A;  . COLONOSCOPY WITH PROPOFOL N/A 04/09/2014   Procedure: COLONOSCOPY WITH PROPOFOL;  Surgeon: Cleotis Nipper, MD;  Location: [facility];  Service: Endoscopy;  Laterality: N/A;  . CORONARY STENT PLACEMENT  01/19/2016  STENT SYNERGY DES [id number] drug eluting stent was successfully placed, and overlaps the 2.5 x 38 mm Synergy stent placed distally.  . ESOPHAGOGASTRODUODENOSCOPY (EGD) WITH PROPOFOL N/A 04/09/2014   Procedure: ESOPHAGOGASTRODUODENOSCOPY (EGD) WITH PROPOFOL;  Surgeon: Cleotis Nipper, MD;  Location: [facility];  Service: Endoscopy;  Laterality: N/A;  . HEMORRHOID SURGERY    . HERNIA REPAIR    . KNEE ARTHROSCOPY      Current Medications: Outpatient Medications Prior to Visit  Medication Sig Dispense Refill  .  acetaminophen-codeine (TYLENOL #3) 300-30 MG tablet One every 4 hours as needed for cough 40 tablet 0  . albuterol (PROVENTIL HFA;VENTOLIN HFA) 108 (90 Base) MCG/ACT inhaler Inhale 2 puffs into the lungs every 6 (six) hours as needed for wheezing or shortness of breath. 1 Inhaler 5  . albuterol (PROVENTIL) (2.5 MG/3ML) 0.083% nebulizer solution Take 3 mLs (2.5 mg total) by nebulization every 4 (four) hours as needed for shortness of breath. 500 mL 1  . ALPRAZolam (XANAX) 0.5 MG tablet Take 1 tablet (0.5 mg total) by mouth 2 (two) times daily as needed for anxiety. 30 tablet 0  . aspirin EC 81 MG tablet Take 81 mg by mouth every morning.     Marland Kitchen atorvastatin (LIPITOR) 80 MG tablet Take 1 tablet (80 mg total) by mouth every morning. 30 tablet 0  . BYSTOLIC 5 MG tablet Take 5 mg by mouth daily.    Marland Kitchen diltiazem (DILACOR XR) 180 MG 24 hr capsule Take 180 mg by mouth daily.    . divalproex (DEPAKOTE ER) 500 MG 24 hr tablet Take 1 tablet (500 mg total) by mouth at bedtime. 30 tablet 11  . esomeprazole (NEXIUM) 40 MG capsule Take 40 mg by mouth every morning.     Marland Kitchen estradiol (ESTRACE) 1 MG tablet Take 1 mg by mouth every morning.     . famotidine (PEPCID) 20 MG tablet Take 20 mg by mouth daily.    . furosemide (LASIX) 40 MG tablet Take 0.5 tablets (20 mg total) by mouth daily. 30 tablet 3  . gabapentin (NEURONTIN) 100 MG capsule Take 1 capsule (100 mg total) by mouth 3 (three) times daily. One three times daily 90 capsule 2  . hydrALAZINE (APRESOLINE) 25 MG tablet Take 1 tablet (25 mg total) by mouth every 8 (eight) hours. 90 tablet 0  . metFORMIN (GLUCOPHAGE) 500 MG tablet Take 1 tablet (500 mg total) by mouth 2 (two) times daily with a meal. 60 tablet 0  . mometasone-formoterol (DULERA) 100-5 MCG/ACT AERO Inhale 2 puffs into the lungs 2 (two) times daily. 1 Inhaler 0  . potassium chloride SA (K-DUR,KLOR-CON) 20 MEQ tablet Take 20 mEq by mouth every morning.     Marland Kitchen Respiratory Therapy Supplies (FLUTTER) DEVI  Use as directed 1 each 0  . ticagrelor (BRILINTA) 90 MG TABS tablet Take 1 tablet (90 mg total) by mouth every 12 (twelve) hours. 180 tablet 3  . valsartan (DIOVAN) 320 MG tablet Take 1 tablet (320 mg total) by mouth daily. 90 tablet 3  . guaiFENesin (MUCINEX) 600 MG 12 hr tablet Take 1 tablet (600 mg total) by mouth 2 (two) times daily. 10 tablet 0  . predniSONE (DELTASONE) 10 MG tablet 40 mg x 3 days, 30 mg x 3 days, 20 mg x3 days, 10 mg x 3 days then stop 30 tablet 0   No facility-administered medications prior to visit.      Allergies:   Amlodipine besylate; Lisinopril; and Pantoprazole sodium  Social History   Social History  . Marital status: Married    Spouse name: N/A  . Number of children: 3  . Years of education: some college   Occupational History  . Merchandiser    Social History Main Topics  . Smoking status: Former Smoker    Packs/day: 1.00    Years: 30.00    Types: Cigarettes    Quit date: 01/09/2004  . Smokeless tobacco: Never Used  . Alcohol use No  . Drug use: No  . Sexual activity: Not on file   Other Topics Concern  . Not on file   Social History Narrative   Pt lives with husband in [place].   Right-handed.   2 cups caffeine per day.     Family History:  The patient's family history includes Allergies in her mother; Asthma in her mother; CAD (age of onset: 43) in her father; CAD (age of onset: 57) in her sister; Congestive Heart Failure in her sister; Diabetes in her father; Hypertension in her father.     ROS:   Please see the history of present illness.    ROS All other systems reviewed and are negative.   PHYSICAL EXAM:   VS:  BP (!) 142/68 (BP Location: Left Arm, Patient Position: Sitting, Cuff Size: Large)   Pulse 72   Ht 5\' 2"  (1.575 m)   Wt 170 lb 12.8 oz (77.5 kg)   BMI 31.24 kg/m    GEN: Well nourished, well developed, in no acute distress  HEENT: normal, erythematous and painful left eye Neck: no JVD, carotid bruits, or  masses Cardiac: RRR; no murmurs, rubs, or gallops,no edema  Respiratory:  clear to auscultation bilaterally, normal work of breathing GI: soft, nontender, nondistended, + BS MS: no deformity or atrophy  Skin: warm and dry, no rash Neuro:  Alert and Oriented x 3, Strength and sensation are intact Psych: euthymic mood, full affect  Wt Readings from Last 3 Encounters:  03/14/16 170 lb 12.8 oz (77.5 kg)  03/10/16 171 lb 3.2 oz (77.7 kg)  02/27/16 170 lb (77.1 kg)      Studies/Labs Reviewed:   EKG:  EKG is NOT ordered today.   Recent Labs: 01/20/2016: ALT 26 01/21/2016: Magnesium 2.4 02/23/2016: Pro B Natriuretic peptide (BNP) 13.0 02/27/2016: B Natriuretic Peptide 44.0 02/28/2016: BUN 19; Creatinine, Ser 1.17; Hemoglobin 10.1; Platelets 350; Potassium 4.4; Sodium 138   Lipid Panel    Component Value Date/Time   CHOL 207 (H) 01/21/2016 0746   TRIG 102 01/21/2016 0746   HDL 77 01/21/2016 0746   CHOLHDL 2.7 01/21/2016 0746   VLDL 20 01/21/2016 0746   LDLCALC 110 (H) 01/21/2016 0746    Additional studies/ records that were reviewed today include:  L.Heart Cath 01/18/16   Mid RCA lesion, 70 %stenosed.  Dist RCA lesion, 90 %stenosed.  A STENT SYNERGY DES [id number] drug eluting stent was successfully placed, and overlaps the 2.5 x 38 mm Synergy stent placed distally.  Prox RCA lesion, 80 %stenosed.  Post intervention, there is a 0% residual stenosis.  Ost Cx to Prox Cx lesion, 10 %stenosed.  Mid LAD lesion, 10 %stenosed.  1. NSTEMI 2. Severe single vessel CAD with severe stenosis in the mid and distal RCA 3. Successful PTCA/DES x 2 mid and distal RCA Recommendations: Dual anti-platelet therapy with ASA and Brilinta for one year. Continue statin.     TTE 01/18/16 Left ventricle: The cavity size was normal. Septal wall thicknesswas increased in a pattern of  severe LVH with mild hypertrophy ofthe posterior wall. There was severe concentric hypertrophy.Systolic  function was vigorous. The estimated ejection fractionwas in the range of 65% to 70%. Wall motion was normal; there were no regional wall motion abnormalities. Features areconsistent with a pseudonormal left ventricular filling pattern,with concomitant abnormal relaxation and increased filling pressure (grade 2 diastolic dysfunction). Doppler parameters are consistent with high ventricular filling pressure. - Aortic valve: Transvalvular velocity was within the normal range. There was no stenosis. There was no regurgitation. - Mitral valve: Transvalvular velocity was within the normal range. There was no evidence for stenosis. There was mild regurgitation. - Right ventricle: The cavity size was normal. Wall thickness was normal. Systolic function was normal. - Tricuspid valve: There was trivial regurgitation. - Pulmonary arteries: Systolic pressure was within the normal range.    ASSESSMENT & PLAN:   Recently diagnosed CAD: NSTEMI: cath on 01/18/2016 with severe single vessel CAD with severe stenosis in the mid and distal RCA with successful PTCA/DES x 2 mid and distal RCA. -- Continue DAPT with ASA and Brilinta for one year. Continue statin & BB ( on bystolic in the setting of chronic lung disease)   Chronic diastolic CHF:  Appears euvolemic today. Continue lasix 20mg  daily.   Chronic asthma: with recent exacerbation in the setting of acute bronchitis. Done with prednisone taper   HTN: BP borderline today. 138/70 on my recheck. If it continues to run high we can uptitrate bystolic  HLD: LDL [id number]. Goal <70. Continue on atorva 80mg  daily   DM2: HgA1c 7.0. Continue Home rx.  Painful red eye: we have arranged for her to be seen by an opthamologist today, Dr. Posey Pronto   Medication Adjustments/Labs and Tests Ordered: Current medicines are reviewed at length with the patient today.  Concerns regarding medicines are outlined above.  Medication changes, Labs and Tests ordered today are listed  in the Patient Instructions below. Patient Instructions  Medication Instructions:  Your physician recommends that you continue on your current medications as directed. Please refer to the Current Medication list given to you today.   Labwork: None ordered  Testing/Procedures: None ordere  Follow-Up: Your physician recommends that you schedule a follow-up appointment in: 3 MONTHS WITH DR. Marlou Porch   Any Other Special Instructions Will Be Listed Below (If Applicable).     If you need a refill on your cardiac medications before your next appointment, please call your pharmacy.      Signed, Angelena Form, PA-C  03/14/2016 2:45 PM    [facility] [address], [place], [place]  [address] Phone: [phone]; Fax: [phone]

## 2016-03-14 ENCOUNTER — Encounter: Payer: Self-pay | Admitting: Adult Health

## 2016-03-14 ENCOUNTER — Ambulatory Visit (INDEPENDENT_AMBULATORY_CARE_PROVIDER_SITE_OTHER): Payer: Medicare Other | Admitting: Physician Assistant

## 2016-03-14 VITALS — BP 142/68 | HR 72 | Ht 62.0 in | Wt 170.8 lb

## 2016-03-14 DIAGNOSIS — E118 Type 2 diabetes mellitus with unspecified complications: Secondary | ICD-10-CM | POA: Diagnosis not present

## 2016-03-14 DIAGNOSIS — I5032 Chronic diastolic (congestive) heart failure: Secondary | ICD-10-CM | POA: Diagnosis not present

## 2016-03-14 DIAGNOSIS — H578 Other specified disorders of eye and adnexa: Secondary | ICD-10-CM

## 2016-03-14 DIAGNOSIS — J45901 Unspecified asthma with (acute) exacerbation: Secondary | ICD-10-CM

## 2016-03-14 DIAGNOSIS — E785 Hyperlipidemia, unspecified: Secondary | ICD-10-CM

## 2016-03-14 DIAGNOSIS — I2510 Atherosclerotic heart disease of native coronary artery without angina pectoris: Secondary | ICD-10-CM | POA: Diagnosis not present

## 2016-03-14 DIAGNOSIS — H5789 Other specified disorders of eye and adnexa: Secondary | ICD-10-CM

## 2016-03-14 DIAGNOSIS — I10 Essential (primary) hypertension: Secondary | ICD-10-CM

## 2016-03-14 NOTE — Patient Instructions (Addendum)
Medication Instructions:  Your physician recommends that you continue on your current medications as directed. Please refer to the Current Medication list given to you today.   Labwork: None ordered  Testing/Procedures: None ordere  Follow-Up: Your physician recommends that you schedule a follow-up appointment in: 3 MONTHS WITH DR. Marlou Porch   Any Other Special Instructions Will Be Listed Below (If Applicable).     If you need a refill on your cardiac medications before your next appointment, please call your pharmacy.

## 2016-03-15 ENCOUNTER — Other Ambulatory Visit: Payer: Self-pay | Admitting: Internal Medicine

## 2016-03-15 ENCOUNTER — Ambulatory Visit
Admission: RE | Admit: 2016-03-15 | Discharge: 2016-03-15 | Disposition: A | Discharge status: Home / Self Care | Payer: Medicare Other | Source: Ambulatory Visit | Attending: Neurology | Admitting: Neurology

## 2016-03-15 ENCOUNTER — Other Ambulatory Visit (HOSPITAL_COMMUNITY): Payer: Self-pay | Admitting: Internal Medicine

## 2016-03-15 ENCOUNTER — Ambulatory Visit (HOSPITAL_COMMUNITY): Payer: Self-pay

## 2016-03-15 DIAGNOSIS — R569 Unspecified convulsions: Secondary | ICD-10-CM | POA: Diagnosis not present

## 2016-03-15 DIAGNOSIS — R058 Other specified cough: Secondary | ICD-10-CM

## 2016-03-15 DIAGNOSIS — R05 Cough: Secondary | ICD-10-CM

## 2016-03-15 DIAGNOSIS — R200 Anesthesia of skin: Secondary | ICD-10-CM

## 2016-03-15 MED ORDER — GADOBENATE DIMEGLUMINE 529 MG/ML IV SOLN
16.0000 mL | Freq: Once | INTRAVENOUS | Status: AC | PRN
  Administered 2016-03-15: 16 mL via INTRAVENOUS

## 2016-03-15 MED ORDER — ACETAMINOPHEN-CODEINE #3 300-30 MG PO TABS: ORAL_TABLET | ORAL | 0 refills | Status: DC

## 2016-03-15 NOTE — Telephone Encounter (Signed)
MW please advise on refill. Thanks. 

## 2016-03-17 ENCOUNTER — Ambulatory Visit (HOSPITAL_COMMUNITY): Payer: Self-pay

## 2016-03-17 ENCOUNTER — Telehealth: Payer: Self-pay | Admitting: Neurology

## 2016-03-17 ENCOUNTER — Telehealth: Payer: Self-pay | Admitting: Internal Medicine

## 2016-03-17 MED ORDER — BUDESONIDE-FORMOTEROL FUMARATE 80-4.5 MCG/ACT IN AERO: 2.0000 | INHALATION_SPRAY | Freq: Two times a day (BID) | RESPIRATORY_TRACT | 5 refills | Status: DC

## 2016-03-17 NOTE — Telephone Encounter (Signed)
Please call patient, she should be able to go back to work, but she should not drive until episode free for 6 months

## 2016-03-17 NOTE — Telephone Encounter (Signed)
Pt called back in wanting to know if she is allowed to go back to work. Please call

## 2016-03-17 NOTE — Telephone Encounter (Signed)
Spoke to pt and relayed that she should be able to go back to work.  No driving for 6 months from episode.  Pt verbalized understanding.

## 2016-03-17 NOTE — Telephone Encounter (Signed)
Please call patient, MRI of the brain showed bilateral temporal lobe signal change, and also scattered supratentorium small vessel disease, EEG was normal February 25 2016  She should continue take her Depakote,  Please give her a follow-up visit in 1-2 months, I will go over MRI findings with her in detail  Abnormal MRI brain (with and without) demonstrating: 1. Mild periventricular and subcortical and pontine scattered chronic small vessel ischemic disease.  2. Slightly increased T2FLAIR signal in the mesial temporal lobes. The right hippocampus is slightly smaller than the left side. This is non-specific but can be seen in association with mesial temporal sclerosis, prior trauma or chronic ischemic disease.  3. No acute findings. 4. Compared to MRI from 06/02/09 there is mild progression of chronic small vessel ischemic disease. The mesial temporal lobe signal changes appear stable.

## 2016-03-17 NOTE — Telephone Encounter (Signed)
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  Works 40hour week.  She has been out of work since September since heart attack and then 2 wks ago from asthma attack.  She wanted to make sure.  She has had no sz.  Depakote taking at night (takes her an hour to really wake up) then she is good to go.

## 2016-03-17 NOTE — Telephone Encounter (Signed)
Spoke with pt. She is aware of MW's recommendation. Rx has been sent in. Nothing further was needed. 

## 2016-03-17 NOTE — Telephone Encounter (Signed)
I have spoken with [name] this morning and reviewed MRI results with her.  I have advised that she should continue Depakote; still is unable to drive at this time.  Appt. given with YY on 05-12-15 at 1pm/fim

## 2016-03-17 NOTE — Telephone Encounter (Signed)
Ok to switch, make sure keeps f/u ov with all meds in hand   rx is symbicort 80 Take 2 puffs first thing in am and then another 2 puffs about 12 hours later.

## 2016-03-17 NOTE — Telephone Encounter (Signed)
Patient returning call - she can be reached at [phone] -pr

## 2016-03-17 NOTE — Telephone Encounter (Signed)
Pt was started on Dulera, pt states her co-pay for this medications  Is $117 dollars. Pt states she contacted her insurance company and a covered alternative is symbicort. Pt has been on symbicort previously and is wanting to know if she can switch back to symbicort since it's a cheaper co-pay.  MW please advise. Thanks.

## 2016-03-17 NOTE — Telephone Encounter (Signed)
lmomtcb x1 

## 2016-03-20 ENCOUNTER — Ambulatory Visit (HOSPITAL_COMMUNITY): Payer: Self-pay

## 2016-03-22 ENCOUNTER — Ambulatory Visit (HOSPITAL_COMMUNITY): Payer: Self-pay

## 2016-03-23 ENCOUNTER — Encounter (HOSPITAL_COMMUNITY): Payer: Self-pay | Admitting: Emergency Medicine

## 2016-03-23 ENCOUNTER — Emergency Department (HOSPITAL_COMMUNITY): Payer: Medicare Other

## 2016-03-23 ENCOUNTER — Emergency Department (HOSPITAL_COMMUNITY)
Admission: EM | Admit: 2016-03-23 | Discharge: 2016-03-23 | Disposition: A | Discharge status: Home / Self Care | Payer: Medicare Other | Attending: Emergency Medicine | Admitting: Emergency Medicine

## 2016-03-23 DIAGNOSIS — Z79899 Other long term (current) drug therapy: Secondary | ICD-10-CM | POA: Insufficient documentation

## 2016-03-23 DIAGNOSIS — I110 Hypertensive heart disease with heart failure: Secondary | ICD-10-CM | POA: Insufficient documentation

## 2016-03-23 DIAGNOSIS — Z7984 Long term (current) use of oral hypoglycemic drugs: Secondary | ICD-10-CM | POA: Insufficient documentation

## 2016-03-23 DIAGNOSIS — Z955 Presence of coronary angioplasty implant and graft: Secondary | ICD-10-CM | POA: Diagnosis not present

## 2016-03-23 DIAGNOSIS — Z7982 Long term (current) use of aspirin: Secondary | ICD-10-CM | POA: Diagnosis not present

## 2016-03-23 DIAGNOSIS — I5032 Chronic diastolic (congestive) heart failure: Secondary | ICD-10-CM | POA: Insufficient documentation

## 2016-03-23 DIAGNOSIS — E1142 Type 2 diabetes mellitus with diabetic polyneuropathy: Secondary | ICD-10-CM | POA: Insufficient documentation

## 2016-03-23 DIAGNOSIS — I2510 Atherosclerotic heart disease of native coronary artery without angina pectoris: Secondary | ICD-10-CM | POA: Insufficient documentation

## 2016-03-23 DIAGNOSIS — R0602 Shortness of breath: Secondary | ICD-10-CM | POA: Diagnosis present

## 2016-03-23 DIAGNOSIS — J4531 Mild persistent asthma with (acute) exacerbation: Secondary | ICD-10-CM

## 2016-03-23 DIAGNOSIS — Z87891 Personal history of nicotine dependence: Secondary | ICD-10-CM | POA: Insufficient documentation

## 2016-03-23 DIAGNOSIS — I252 Old myocardial infarction: Secondary | ICD-10-CM | POA: Insufficient documentation

## 2016-03-23 LAB — CBC
HCT: 31 % — ABNORMAL LOW (ref 36.0–46.0)
Hemoglobin: 10.3 g/dL — ABNORMAL LOW (ref 12.0–15.0)
MCH: 31.9 pg (ref 26.0–34.0)
MCHC: 33.2 g/dL (ref 30.0–36.0)
MCV: 96 fL (ref 78.0–100.0)
PLATELETS: 187 10*3/uL (ref 150–400)
RBC: 3.23 MIL/uL — ABNORMAL LOW (ref 3.87–5.11)
RDW: 14.8 % (ref 11.5–15.5)
WBC: 5.3 10*3/uL (ref 4.0–10.5)

## 2016-03-23 LAB — BASIC METABOLIC PANEL
Anion gap: 9 (ref 5–15)
BUN: 16 mg/dL (ref 6–20)
CHLORIDE: 102 mmol/L (ref 101–111)
CO2: 26 mmol/L (ref 22–32)
CREATININE: 1.18 mg/dL — AB (ref 0.44–1.00)
Calcium: 8.3 mg/dL — ABNORMAL LOW (ref 8.9–10.3)
GFR calc Af Amer: 54 mL/min — ABNORMAL LOW (ref >60)
GFR calc non Af Amer: 47 mL/min — ABNORMAL LOW (ref >60)
GLUCOSE: 186 mg/dL — AB (ref 65–99)
Potassium: 3.6 mmol/L (ref 3.5–5.1)
SODIUM: 137 mmol/L (ref 135–145)

## 2016-03-23 LAB — I-STAT TROPONIN, ED: Troponin i, poc: 0 ng/mL (ref 0.00–0.08)

## 2016-03-23 MED ORDER — IPRATROPIUM-ALBUTEROL 0.5-2.5 (3) MG/3ML IN SOLN
RESPIRATORY_TRACT | Status: DC
Start: 2016-03-23 — End: 2016-03-24
  Filled 2016-03-23: qty 3

## 2016-03-23 MED ORDER — ALBUTEROL SULFATE (2.5 MG/3ML) 0.083% IN NEBU
INHALATION_SOLUTION | RESPIRATORY_TRACT | Status: AC
  Filled 2016-03-23: qty 6

## 2016-03-23 MED ORDER — IPRATROPIUM-ALBUTEROL 0.5-2.5 (3) MG/3ML IN SOLN
3.0000 mL | Freq: Once | RESPIRATORY_TRACT | Status: AC
  Administered 2016-03-23: 3 mL via RESPIRATORY_TRACT

## 2016-03-23 MED ORDER — ALBUTEROL SULFATE (2.5 MG/3ML) 0.083% IN NEBU
5.0000 mg | INHALATION_SOLUTION | Freq: Once | RESPIRATORY_TRACT | Status: AC
  Administered 2016-03-23: 5 mg via RESPIRATORY_TRACT

## 2016-03-23 MED ORDER — DEXAMETHASONE 4 MG PO TABS
10.0000 mg | ORAL_TABLET | Freq: Once | ORAL | Status: AC
  Administered 2016-03-23: 10 mg via ORAL
  Filled 2016-03-23: qty 3

## 2016-03-23 NOTE — ED Provider Notes (Signed)
[facility] DEPT Provider Note   CSN: [id number] Arrival date & time: 03/23/16  1806     History   Chief Complaint Chief Complaint  Patient presents with  . Asthma  . Shortness of Breath    HPI [name] is a 65 y.o. female.  The history is provided by the patient.  Shortness of Breath  This is a recurrent problem. The average episode lasts 2 days. The problem occurs continuously.The current episode started 2 days ago. The problem has been gradually worsening. Associated symptoms include cough and wheezing. Pertinent negatives include no fever, no sputum production, no hemoptysis, no orthopnea and no vomiting. Precipitated by: family members had recent URI. She has tried inhaled steroids and beta-agonist inhalers for the symptoms. The treatment provided mild relief. She has had prior hospitalizations. She has had prior ED visits. Associated medical issues include asthma.    Past Medical History:  Diagnosis Date  . Anxiety   . Asthma   . Bronchitis   . Chronic diastolic CHF (congestive heart failure) ([place])   . Coronary artery disease    a. NSTEMI 12/2015 - [facility] 01/18/16: s/p overlapping DESx2 to RCA, 10% ost-prox Cx,10% mLAD.  Marland Kitchen Depression   . Diabetes mellitus   . Dyslipidemia   . GERD (gastroesophageal reflux disease)   . Heart attack   . Hypertension   . Hypertensive heart disease   . NSTEMI (non-ST elevated myocardial infarction) ([place]) 12/2015  . Pneumonia     Patient Active Problem List   Diagnosis Date Noted  . Right leg swelling 02/27/2016  . Acute respiratory failure with hypoxia ([place])   . Uncontrolled type 2 diabetes mellitus with complication ([place])   . Acute pulmonary embolism ([place])   . HLD (hyperlipidemia)   . Seizures ([place]) 01/27/2016  . Numbness 01/27/2016  . CAD in native artery 01/20/2016  . Hypertensive heart disease 01/20/2016  . NSTEMI (non-ST elevated myocardial infarction) ([place]) 01/18/2016  . Acute bronchitis with asthma with acute  exacerbation 01/16/2016  . Acute hypokalemia 01/16/2016  . Hypomagnesemia 01/16/2016  . Acute respiratory failure ([place]) 01/16/2016  . Dyspnea   . Chest pain   . Asthma exacerbation 07/21/2015  . Upper airway cough syndrome vs Asthma  08/28/2014  . Diabetes mellitus type 2, controlled ([place]) 08/06/2014  . Hyperlipidemia 08/06/2014  . ALLERGIC RHINITIS 07/22/2008  . 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 DYSPNEA 07/22/2008  . Diabetes mellitus type 2, uncontrolled ([place]) 07/21/2008  . DIABETIC PERIPHERAL NEUROPATHY 07/21/2008  . Depression with anxiety 07/21/2008  . Essential hypertension 07/21/2008  . Mild persistent chronic asthma without complication 99991111  . G E R D 07/21/2008  . PULMONARY EMBOLISM, HX OF 07/21/2008    Past Surgical History:  Procedure Laterality Date  . ABDOMINAL HYSTERECTOMY    . CARDIAC CATHETERIZATION  1994   minimal LAD dz, no other dz, EF normal  . CARDIAC CATHETERIZATION N/A 01/18/2016   Procedure: Left Heart Cath and Coronary Angiography;  Surgeon: Burnell Blanks, MD;  Location: [facility];  Service: Cardiovascular;  Laterality: N/A;  . CARDIAC CATHETERIZATION N/A 01/18/2016   Procedure: Coronary Stent Intervention;  Surgeon: Burnell Blanks, MD;  Location: [facility];  Service: Cardiovascular;  Laterality: N/A;  . COLONOSCOPY WITH PROPOFOL N/A 04/09/2014   Procedure: COLONOSCOPY WITH PROPOFOL;  Surgeon: Cleotis Nipper, MD;  Location: [facility];  Service: Endoscopy;  Laterality: N/A;  . CORONARY STENT PLACEMENT  01/19/2016   STENT SYNERGY DES [id number]  drug eluting stent was successfully placed, and overlaps the 2.5 x 38 mm Synergy stent placed distally.  . ESOPHAGOGASTRODUODENOSCOPY (EGD) WITH PROPOFOL N/A 04/09/2014   Procedure: ESOPHAGOGASTRODUODENOSCOPY (EGD) WITH PROPOFOL;  Surgeon: Cleotis Nipper, MD;  Location: [facility];  Service: Endoscopy;  Laterality: N/A;  . HEMORRHOID SURGERY    . HERNIA REPAIR     . KNEE ARTHROSCOPY      OB History    No data available       Home Medications    Prior to Admission medications   Medication Sig Start Date End Date Taking? Authorizing Provider  acetaminophen-codeine (TYLENOL #3) 300-30 MG tablet One every 4 hours as needed for cough 03/15/16  Yes Tanda Rockers, MD  albuterol (PROVENTIL HFA;VENTOLIN HFA) 108 (90 Base) MCG/ACT inhaler Inhale 2 puffs into the lungs every 6 (six) hours as needed for wheezing or shortness of breath. 08/12/15  Yes Magdalen Spatz, NP  albuterol (PROVENTIL) (2.5 MG/3ML) 0.083% nebulizer solution Take 3 mLs (2.5 mg total) by nebulization every 4 (four) hours as needed for shortness of breath. 07/31/15  Yes Theodis Blaze, MD  ALPRAZolam Duanne Moron) 0.5 MG tablet Take 1 tablet (0.5 mg total) by mouth 2 (two) times daily as needed for anxiety. 07/31/15  Yes Theodis Blaze, MD  aspirin EC 81 MG tablet Take 81 mg by mouth every morning.    Yes Historical Provider, MD  atorvastatin (LIPITOR) 80 MG tablet Take 1 tablet (80 mg total) by mouth every morning. 01/22/16  Yes [name], MD  budesonide-formoterol (SYMBICORT) 80-4.5 MCG/ACT inhaler Inhale 2 puffs into the lungs 2 (two) times daily. 03/17/16  Yes Tanda Rockers, MD  BYSTOLIC 5 MG tablet Take 5 mg by mouth daily. 02/21/16  Yes Historical Provider, MD  cetirizine (ZYRTEC) 10 MG tablet Take 10 mg by mouth daily.   Yes Historical Provider, MD  dextromethorphan (DELSYM) 30 MG/5ML liquid Take by mouth as needed for cough.   Yes Historical Provider, MD  diltiazem (DILACOR XR) 180 MG 24 hr capsule Take 180 mg by mouth daily. 02/22/16  Yes Historical Provider, MD  divalproex (DEPAKOTE ER) 500 MG 24 hr tablet Take 1 tablet (500 mg total) by mouth at bedtime. 01/27/16  Yes Marcial Pacas, MD  esomeprazole (NEXIUM) 40 MG capsule Take 40 mg by mouth every morning.    Yes Historical Provider, MD  estradiol (ESTRACE) 1 MG tablet Take 1 mg by mouth every morning.    Yes Historical Provider, MD    famotidine (PEPCID) 20 MG tablet Take 20 mg by mouth daily.   Yes Historical Provider, MD  furosemide (LASIX) 40 MG tablet Take 0.5 tablets (20 mg total) by mouth daily. 01/26/16 04/25/16 Yes Eileen Stanford, PA-C  gabapentin (NEURONTIN) 100 MG capsule Take 1 capsule (100 mg total) by mouth 3 (three) times daily. One three times daily 02/23/16  Yes Tanda Rockers, MD  hydrALAZINE (APRESOLINE) 25 MG tablet Take 1 tablet (25 mg total) by mouth every 8 (eight) hours. 03/02/16  Yes Geradine Girt, DO  metFORMIN (GLUCOPHAGE) 500 MG tablet Take 1 tablet (500 mg total) by mouth 2 (two) times daily with a meal. 03/02/16  Yes Jessica U Vann, DO  mometasone-formoterol (DULERA) 100-5 MCG/ACT AERO Inhale 2 puffs into the lungs 2 (two) times daily. 02/23/16  Yes Tanda Rockers, MD  potassium chloride SA (K-DUR,KLOR-CON) 20 MEQ tablet Take 20 mEq by mouth every morning.    Yes Historical Provider, MD  sodium  chloride (OCEAN) 0.65 % SOLN nasal spray Place 1 spray into both nostrils as needed for congestion.   Yes Historical Provider, MD  ticagrelor (BRILINTA) 90 MG TABS tablet Take 1 tablet (90 mg total) by mouth every 12 (twelve) hours. 01/26/16  Yes Eileen Stanford, PA-C  valsartan (DIOVAN) 320 MG tablet Take 1 tablet (320 mg total) by mouth daily. 01/26/16  Yes Eileen Stanford, PA-C  Respiratory Therapy Supplies (FLUTTER) DEVI Use as directed 02/23/16   Tanda Rockers, MD    Family History Family History  Problem Relation Age of Onset  . Allergies Mother   . Asthma Mother   . CAD Father 45  . Diabetes Father   . Hypertension Father   . Congestive Heart Failure Sister     died in her 8s  . CAD Sister 67    Social History Social History  Substance Use Topics  . Smoking status: Former Smoker    Packs/day: 1.00    Years: 30.00    Types: Cigarettes    Quit date: 01/09/2004  . Smokeless tobacco: Never Used  . Alcohol use No     Allergies   Amlodipine besylate; Lisinopril; and Pantoprazole  sodium   Review of Systems Review of Systems  Constitutional: Negative for fever.  Respiratory: Positive for cough, shortness of breath and wheezing. Negative for hemoptysis and sputum production.   Cardiovascular: Negative for orthopnea.  Gastrointestinal: Negative for vomiting.  All other systems reviewed and are negative.    Physical Exam Updated Vital Signs BP (!) 154/133   Pulse 83   Temp 98.2 F (36.8 C) (Oral)   Resp 23   Ht 5\' 2"  (1.575 m)   Wt 170 lb (77.1 kg)   SpO2 100%   BMI 31.09 kg/m   Physical Exam  Constitutional: She is oriented to person, place, and time. She appears well-developed and well-nourished. No distress.  HENT:  Head: Normocephalic.  Nose: Nose normal.  Eyes: Conjunctivae are normal.  Neck: Neck supple. No tracheal deviation present.  Cardiovascular: Normal rate, regular rhythm and normal heart sounds.   Pulmonary/Chest: Effort normal. No accessory muscle usage. No tachypnea. No respiratory distress. She has wheezes (faint bilateral).  Coarse upper airway noise with cough, no stridor, vocalizing normally and speaking in full sentences  Abdominal: Soft. She exhibits no distension.  Neurological: She is alert and oriented to person, place, and time.  Skin: Skin is warm and dry.  Psychiatric: She has a normal mood and affect.     ED Treatments / Results  Labs (all labs ordered are listed, but only abnormal results are displayed) Labs Reviewed  BASIC METABOLIC PANEL - Abnormal; Notable for the following:       Result Value   Glucose, Bld 186 (*)    Creatinine, Ser 1.18 (*)    Calcium 8.3 (*)    GFR calc non Af Amer 47 (*)    GFR calc Af Amer 54 (*)    All other components within normal limits  CBC - Abnormal; Notable for the following:    RBC 3.23 (*)    Hemoglobin 10.3 (*)    HCT 31.0 (*)    All other components within normal limits    EKG  EKG Interpretation  Date/Time:  Thursday March 23 2016 21:10:04 EST Ventricular  Rate:  88 PR Interval:    QRS Duration: 84 QT Interval:  363 QTC Calculation: 440 R Axis:   9 Text Interpretation:  Sinus rhythm Low voltage, precordial  leads Since last tracing rate slower Otherwise no significant change Confirmed by Gaylen Venning MD, Olliver Boyadjian [id number]) on 03/23/2016 9:49:16 PM       Radiology Dg Chest 2 View  Result Date: 03/23/2016 CLINICAL DATA:  Epigastric pain this afternoon. Shortness of breath for 1 month. Cough beginning this morning. History of recent MI in September 2017. EXAM: CHEST  2 VIEW COMPARISON:  02/27/2016 FINDINGS: Normal heart size and pulmonary vascularity. No focal airspace disease or consolidation in the lungs. No blunting of costophrenic angles. No pneumothorax. Mediastinal contours appear intact. Tortuous aorta IMPRESSION: No active cardiopulmonary disease. Electronically Signed   By: Lucienne Capers M.D.   On: 03/23/2016 21:40    Procedures Procedures (including critical care time)  Medications Ordered in ED Medications  albuterol (PROVENTIL) (2.5 MG/3ML) 0.083% nebulizer solution (not administered)  ipratropium-albuterol (DUONEB) 0.5-2.5 (3) MG/3ML nebulizer solution (not administered)  albuterol (PROVENTIL) (2.5 MG/3ML) 0.083% nebulizer solution 5 mg (5 mg Nebulization Given 03/23/16 1822)  ipratropium-albuterol (DUONEB) 0.5-2.5 (3) MG/3ML nebulizer solution 3 mL (3 mLs Nebulization Given 03/23/16 1846)     Initial Impression / Assessment and Plan / ED Course  I have reviewed the triage vital signs and the nursing notes.  Pertinent labs & imaging results that were available during my care of the patient were reviewed by me and considered in my medical decision making (see chart for details).  Clinical Course     65 y.o. female presents with apparent asthma exacerbation. She has had frequent steroid administrations recently and is followed by pulmonology. She has some mild wheezing and upper airway noise currently. She states her pulmonologist  is attempting to keep her off of steroids when possible. Considered prednisone taper but given milder symptoms and onset after exposure to children with URI, will attempt decadron administration to blunt the exacerbation without a prolonged administration. Recommended close f/u with pulmonology and strict return precautions were discussed for signs of worsening. No hypoxemia, infiltrate, or other indication for inpatient admission currently. Pt has breathing treatments at home to continue supportive therapy pending reassessment.   Final Clinical Impressions(s) / ED Diagnoses   Final diagnoses:  Mild persistent asthma with exacerbation    New Prescriptions Discharge Medication List as of 03/23/2016 10:30 PM       Leo Grosser, MD 03/24/16 [phone]

## 2016-03-23 NOTE — ED Triage Notes (Signed)
Pt presents to ED for assessment after she began to have an asthma attack at her family gathering.  Pt sts she's been around her grandkids and they have colds, but denies any other exposure to set off her attack.  Pt SpO2 100% at triage.  Pt had 1 home nebulizer treatment and had 5mg  albuterol at triage.

## 2016-03-27 ENCOUNTER — Ambulatory Visit (HOSPITAL_COMMUNITY): Payer: Self-pay

## 2016-03-29 ENCOUNTER — Ambulatory Visit (HOSPITAL_COMMUNITY): Payer: Self-pay

## 2016-03-31 ENCOUNTER — Ambulatory Visit (HOSPITAL_COMMUNITY): Payer: Self-pay

## 2016-04-03 ENCOUNTER — Ambulatory Visit (HOSPITAL_COMMUNITY): Payer: Self-pay

## 2016-04-05 ENCOUNTER — Ambulatory Visit (HOSPITAL_COMMUNITY): Payer: Self-pay

## 2016-04-07 ENCOUNTER — Ambulatory Visit (HOSPITAL_COMMUNITY): Payer: Self-pay

## 2016-04-07 ENCOUNTER — Encounter: Payer: Self-pay | Admitting: Internal Medicine

## 2016-04-07 ENCOUNTER — Other Ambulatory Visit: Payer: Self-pay | Admitting: Internal Medicine

## 2016-04-07 ENCOUNTER — Ambulatory Visit (INDEPENDENT_AMBULATORY_CARE_PROVIDER_SITE_OTHER): Payer: Medicare Other | Admitting: Internal Medicine

## 2016-04-07 VITALS — BP 132/74 | HR 71 | Ht 62.0 in | Wt 168.0 lb

## 2016-04-07 DIAGNOSIS — R05 Cough: Secondary | ICD-10-CM

## 2016-04-07 DIAGNOSIS — J4530 Mild persistent asthma, uncomplicated: Secondary | ICD-10-CM

## 2016-04-07 DIAGNOSIS — R058 Other specified cough: Secondary | ICD-10-CM

## 2016-04-07 LAB — NITRIC OXIDE: Nitric Oxide: 43

## 2016-04-07 MED ORDER — AMOXICILLIN-POT CLAVULANATE 875-125 MG PO TABS: 1.0000 | ORAL_TABLET | Freq: Two times a day (BID) | ORAL | 0 refills | Status: AC

## 2016-04-07 MED ORDER — BUDESONIDE-FORMOTEROL FUMARATE 80-4.5 MCG/ACT IN AERO: INHALATION_SPRAY | RESPIRATORY_TRACT | 11 refills | Status: DC

## 2016-04-07 MED ORDER — GABAPENTIN 300 MG PO CAPS: 300.0000 mg | ORAL_CAPSULE | Freq: Three times a day (TID) | ORAL | 11 refills | Status: DC

## 2016-04-07 NOTE — Patient Instructions (Addendum)
Stop dulera for now and continue symbicort 80 2bid   Work on inhaler technique:  relax and gently blow all the way out then take a nice smooth deep breath back in, triggering the inhaler at same time you start breathing in.  Hold for up to 5 seconds if you can. Blow out thru nose. Rinse and gargle with water when done      For cough > cough into the flutter valve and increase the neurontin to 300 mg three times daily   Augmentin 875 mg take one pill twice daily  X 10 days - take at breakfast and supper with large glass of water.  It would help reduce the usual side effects (diarrhea and yeast infections) if you ate cultured yogurt at lunch.   Plan A = Automatic = Symbicort 80 Take 2 puffs first thing in am and then another 2 puffs about 12 hours later.    Plan B = Backup Only use your albuterol (Proair) as a rescue medication to be used if you can't catch your breath by resting or doing a relaxed purse lip breathing pattern.  - The less you use it, the better it will work when you need it. - Ok to use the inhaler up to 2 puffs  every 4 hours if you must but call for appointment if use goes up over your usual need - Don't leave home without it !!  (think of it like the spare tire for your car)   Plan C = Crisis - only use your albuterol nebulizer if you first try Plan B and it fails to help > ok to use the nebulizer up to every 4 hours but if start needing it regularly call for immediate appointment  See [name] NP w/in 2 weeks with all your medications, even over the counter meds, separated in two separate bags, the ones you take no matter what vs the ones you stop once you feel better and take only as needed when you feel you need them.   [name]  will generate for you a new user friendly medication calendar that will put [place] all on the same page re: your medication use.     Without this process, it simply isn't possible to assure that we are providing  your outpatient care  with  the attention  to detail we feel you deserve.   If we cannot assure that you're getting that kind of care,  then we cannot manage your problem effectively from this clinic.  Once you have seen [name] and we are sure that we're all on the same page with your medication use she will arrange follow up with me.

## 2016-04-07 NOTE — Progress Notes (Signed)
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 Asthma     Brief history of present illness from the day of admission and additional interim summary    [name] a 65 y.o.femalewith medical history significant for hypertension, diabetes on metformin, history of prior PE and asthma. This past Thursday patient began having upper respiratory infection symptoms with cough and congestion with productive yellow sputum. Because of this she developed asthma exacerbation symptoms which are typical for her with increased wheezing and tightness. She is not had fevers or chills. She's not had any GI symptoms such as nausea, vomiting or diarrhea. She went to her PCP who prescribed prednisone. In addition she utilized her normal nebulizers and MDIs without improvement in her symptoms. Because of increased respiratory distress she presented to the ER today. No documentation from ER to determine if patient was hypoxemic at presentation. She was wheezing significantly upon arrival and becoming dyspneic with talking. She was also reporting pleuritic chest tightness with taking a deep breath.   She has been exposed to multiple sick contacts with similar symptoms prior to onset of her symptoms.She was positive for coronavirus which likely  exacerbated her asthma, also had NSTEMI requiring left heart cath and stent placement.   Hospital issues addressed     1. Acute on chronic asthma exacerbation. She still wheezing, continue IV steroids, added Singulair, added nebulizer treatments both scheduled and as needed, will add Dulera as well and monitor. Respiratory panel positive for coronavirus likely URI exacerbated her asthma + dCHF, SOB better, she has no oxygen requirement for the last 2 days, she still has minimal wheezing but this is mostly upper airway coming from her throat, will be placed on steroid taper, continue home nebulizer treatments and follow with Dr. Melvyn Novas her pulmonary physician in a week.  2. Hypertension. Hydralazine dose doubled, ARB + Cardizem. Follow with PCP for blood pressure control.  3. History of PE. Normal d-dimer.  4. Hypokalemia. Replaced and stable.  5. GERD. On PPI.  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 Continue statin & Cardizem, no B.Blocker due to Asthma, pain free. All with cardiology outpatient.  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  8. DM2 - continue Home rx.   Discharge diagnosis     Principal Problem:   Acute bronchitis with asthma with acute exacerbation Active Problems:   Diabetes mellitus type 2, uncontrolled ([place])   Essential  hypertension   G E R D   PULMONARY EMBOLISM, HX OF   Hyperlipidemia   Chest pain   Acute hypokalemia   Hypomagnesemia   Acute respiratory failure (HCC)   NSTEMI (non-ST elevated myocardial infarction) (HCC)   CAD in native artery   Hypertensive heart disease    01/24/2016  Extended post hosp f/u ov/ transition of care/ /[name] re: dtca asthma/ vcd symb 160 and pred 5  x 10 mg daily  Chief Complaint  Patient presents with  . Follow-up    Pt. recently got out the hopistal for an asthma attack, Pt. states her breathing remains the same, coughing with some yellow to white mucus,Has been needing to use Ventolin more often  last doing well 2 months prior to OV   While on Symb 160/ gerd rx but not dosing ac as rec  Waking up prematurely x 2 months with cough/wheeze/ sob but never contacted me as per the instructions rec Plan A = Automatic = dulera 100 Take 2 puffs first thing in am and then another 2 puffs about 12 hours later.                                      Nexum 40 mg Take 30-60 min before first meal of the day and Pepcid AC 20 mg  at bedtime Work on inhaler technique  Plan B = Backup Only use your albuterol as a rescue medication Plan C = Crisis - only use your albuterol nebulizer if you first try Plan B and it fails to help > ok to use the nebulizer up to every 4 hours but if start needing it regularly call for immediate appointment GERD diet  Take delsym two tsp every 12 hours and use the flutter valve as much as possible and supplement if needed with  tramadol 50 mg up to 2 every 4 hours to suppress the urge to cough  Once you have eliminated the cough for 3 straight days try reducing the tramadol first,  then the delsym as tolerated.   Prednisone 5 mg x 8, then 6, then 4 then 2, then 1 daily x 2 days and off  please schedule a follow up office visit in 2 weeks, sooner if needed with all meds/ inhalers/ neb solutions in hand     02/23/2016  f/u ov/[name] re: dtca vs vcd/ did not bring meds as req Chief Complaint  Patient presents with  . Follow-up    Cough had stopped and then resumed again 1 day ago. She states she woke up coughing at 2 am- non prod and also having chest tightness.   husband says throat clearing never stops x years  Some better on pred but flared w/in a few days of stopping it   rec Plan A = Automatic = dulera 100 Take 2 puffs  first thing in am and then another 2 puffs about 12 hours later.                                      Nexum 40 mg Take 30-60 min before first meal of the day and Pepcid AC 20 mg  at bedtime  Add neurontin (gabapentin) 100 mg three times daily  Work on perfecting  inhaler technique: Plan B = Backup Only use your albuterol as a rescue medication Plan C = Crisis - only use your albuterol nebulizer Take delsym two tsp every 12 hours and use the flutter valve as much as possible and supplement if needed with  Tylenol #3   up to 1-2 every 4 hours to suppress the urge to cough  Once you have eliminated the cough for 3 straight days try reducing the tylenol #3  first,  then the delsym as tolerated.   Prednisone 10 mg take  4 each am x 2 days,   2 each am x 2 days,  1 each am x 2 days and stop  Please remember to go to the lab  department downstairs for your tests - we will call you with the results when they are available. Please schedule a follow up office visit in 2 weeks, sooner if needed with all meds/ inhalers/ neb solutions in hand to see [name] NP for med calendar Late add : given dulera 100 x one sample = 60 pffs - check count on return   02/27/16 admit > no better at all    03/10/2016 NP Post hospital follow up  She presents for a post hospital follow-up Patient was admitted October 29 through November 2 for an asthma exacerbation  treated with IV steroids, and transitioned over to prednisone. At discharge. D-dimer was negative. Venous Dopplers were negative. She is feeling some better, cough and wheezing are decreased.  Does have some post nasal drainage. rec Add Zyrtec 10mg . At bedtime   Saline nasal rinses As needed   Delsym 2 tsp Twice daily  As needed  Cough  Finish Prednisone as directed.  Follow up with Dr. Melvyn Novas  In 4 weeks and As needed   Please contact office for sooner follow up if symptoms do not improve or worsen or seek emergency care         04/07/2016  exteneded f/u ov/[name] re:  Cough/wheezing and sob since July 2017 with nl spirometry today  Chief Complaint  Patient presents with  . Follow-up    Coughing and wheezing "all day"- cough is occ prod with yellow sputum and she has also had yellow nasal d/c. Her breathing is "so so". She is using her albuterol inhaler every 4 hours "I always need it".  She had been using neb 2 x daily, but has not used for the past several days.   no meds/ never got calendar/ not clear at all what she's taking other than way too much saba  No obvious patterns in  day to day or daytime variability or assoc   mucus plugs or hemoptysis or cp or chest tightness, subjective wheeze or overt sinus or hb symptoms. No unusual exp hx or h/o childhood pna/ asthma or knowledge of premature birth.  Sleeping ok without nocturnal  or early am exacerbation  of respiratory  c/o's or need for noct saba. Also denies any obvious fluctuation of symptoms with weather or environmental changes or other aggravating or alleviating factors except as outlined above   Current Medications, Allergies, Complete Past Medical History, Past Surgical History, Family History, and Social History were reviewed in [employer] record.  ROS  The following are not active complaints unless bolded sore throat, dysphagia, dental problems, itching, sneezing,  nasal congestion or excess/ purulent secretions, ear ache,   fever, chills, sweats, unintended wt loss,  classically pleuritic or exertional cp,  orthopnea pnd or leg swelling, presyncope, palpitations, abdominal pain, anorexia, nausea, vomiting, diarrhea  or change in bowel or bladder habits, change in stools or urine, dysuria,hematuria,  rash, arthralgias, visual complaints, headache, numbness, weakness or ataxia or problems with walking or coordination,  change in mood/affect or memory.                  Objective:   Physical Exam  amb bf nad with loud  pseudowheezes    02/23/2016   168 >  04/07/2016  168     Vital signs reviewed  - Note on arrival 02 sats  94% on RA     HEENT: nl dentition, turbinates, and orophanx. Nl external ear canals without cough reflex   NECK :  without JVD/Nodes/TM/ nl carotid upstrokes bilaterally   LUNGS: no acc muscle use,  Lots of transmitted upper airway noise    CV:  RRR  no s3 or murmur or increase in P2, no edema   ABD:  soft and nontender with nl excursion in the supine position. No bruits or organomegaly, bowel sounds nl  MS:  warm without deformities, calf tenderness, cyanosis or clubbing  SKIN: warm and dry without lesions    NEURO:  alert, approp, no deficits     CXR:   02/27/16 Mild bronchitic changes and minimal left basilar atelectasis.

## 2016-04-09 NOTE — Assessment & Plan Note (Addendum)
-   FEN0 01/24/2016 = 13 with active symptoms on symb 160 2bid > changed to dulera 100  - Spirometry 02/23/2016  NO signifiant obstruction with active symptoms  - 02/23/2016  After extensive coaching HFA effectiveness =    75%  - FENO 02/23/2016  =  12   On dulera 100 2bid (unable to confirm adeherence as did not bring it - Singulair 10 mg daily added 02/23/2016 to dulera 100 2bid with 2 week sample only  (unable to verify she used it) - 04/07/2016  After extensive coaching HFA effectiveness =    75% > symb 80 x one sample given - FENO 04/07/2016  =   43   Not clear she really has much asthma but clearly In this case Adherence is the biggest issue and starts with  inability to use HFA effectively and also  understand that SABA treats the symptoms but doesn't get to the underlying problem (inflammation).  I used  the analogy of putting steroid cream on a rash to help explain the meaning of topical therapy and the need to get the drug to the target tissue.

## 2016-04-09 NOTE — Assessment & Plan Note (Addendum)
- CT sinus 08/06/14 > neg  - 08/27/2014 rec keep duler 100 on hand to use for any flare  - FENO 01/24/2016  =   11 on symb 160 2bid with persistent cough > changed to dulera 100 - 01/24/2016  After extensive coaching HFA effectiveness =    75%  - Allergy profile 02/23/16  >  Eos 0.0 /  IgE  87 pos  RAST  Grass/ trees  - Spirometry 04/07/2016  No obstruction based on fev1/fvc or f/v curve   DDX of  difficult airways management almost all start with A and  include Adherence, Ace Inhibitors, Acid Reflux, Active Sinus Disease, Alpha 1 Antitripsin deficiency, Anxiety masquerading as Airways dz,  ABPA,  Allergy(esp in young), Aspiration (esp in elderly), Adverse effects of meds,  Active smokers, A bunch of PE's (a small clot burden can't cause this syndrome unless there is already severe underlying pulm or vascular dz with poor reserve) plus two Bs  = Bronchiectasis and Beta blocker use..and one C= CHF  Adherence is always the initial "prime suspect" and is a multilayered concern that requires a "trust but verify" approach in every patient - starting with knowing how to use medications, especially inhalers, correctly, keeping up with refills and understanding the fundamental difference between maintenance and prns vs those medications only taken for a very short course and then stopped and not refilled.  - need a trust but verify approach here.  To keep things simple, I have asked the patient to first separate medicines that are perceived as maintenance, that is to be taken daily "no matter what", from those medicines that are taken on only on an as-needed basis and I have given the patient examples of both, and then return to see our NP to generate a  detailed  medication calendar which should be followed until the next physician sees the patient and updates it.     ? Acid (or non-acid) GERD > always difficult to exclude as up to 75% of pts in some series report no assoc GI/ Heartburn symptoms> rec max (24h)  acid  suppression and diet restrictions/ reviewed and instructions given in writing.   ? Sinus dz > Augmentin 875 mg take one pill twice daily  X 10 days -then consider repeat sinus ct if still symptomatic  ? Anxiety > usually at the bottom of this list of usual suspects but should be much higher on this pt's based on H and P and note already on psychotropics   ? BB effects > doubt since on bystolic, the most specific BB, with no airflow obst on f/v loop  For now try eliminating the UACS component with neurontin 300 tid /luftter valve and f/u q 2 weeks    I had an extended discussion with the patient reviewing all relevant studies completed to date and  lasting 25 minutes of a 40  minute office visit  re  non-specific but potentially very serious pulmonary symptoms of unknown etiology.  Each maintenance medication was reviewed in detail including most importantly the difference between maintenance and prns and under what circumstances the prns are to be triggered using an action plan format that is not reflected in the computer generated alphabetically organized AVS.    Please see AVS for unique instructions that I personally wrote and verbalized to the the pt in detail and then reviewed with pt  by my nurse highlighting any  changes in therapy recommended at today's visit to their plan of care.

## 2016-04-10 ENCOUNTER — Ambulatory Visit (HOSPITAL_COMMUNITY): Payer: Self-pay

## 2016-04-10 MED ORDER — ACETAMINOPHEN-CODEINE #3 300-30 MG PO TABS: ORAL_TABLET | ORAL | 0 refills | Status: DC

## 2016-04-12 ENCOUNTER — Ambulatory Visit (HOSPITAL_COMMUNITY): Payer: Self-pay

## 2016-04-14 ENCOUNTER — Ambulatory Visit (HOSPITAL_COMMUNITY): Payer: Self-pay

## 2016-04-17 ENCOUNTER — Ambulatory Visit (HOSPITAL_COMMUNITY): Payer: Self-pay

## 2016-04-18 ENCOUNTER — Other Ambulatory Visit: Payer: Self-pay | Admitting: *Deleted

## 2016-04-18 MED ORDER — FUROSEMIDE 40 MG PO TABS: 40.0000 mg | ORAL_TABLET | Freq: Every day | ORAL | 3 refills | Status: DC

## 2016-04-19 ENCOUNTER — Ambulatory Visit (HOSPITAL_COMMUNITY): Payer: Self-pay

## 2016-04-21 ENCOUNTER — Encounter: Payer: Self-pay | Admitting: Adult Health

## 2016-04-21 ENCOUNTER — Ambulatory Visit (INDEPENDENT_AMBULATORY_CARE_PROVIDER_SITE_OTHER): Payer: Medicare Other | Admitting: Adult Health

## 2016-04-21 ENCOUNTER — Ambulatory Visit (HOSPITAL_COMMUNITY): Payer: Self-pay

## 2016-04-21 DIAGNOSIS — J4530 Mild persistent asthma, uncomplicated: Secondary | ICD-10-CM

## 2016-04-21 MED ORDER — BUDESONIDE-FORMOTEROL FUMARATE 80-4.5 MCG/ACT IN AERO: 2.0000 | INHALATION_SPRAY | Freq: Two times a day (BID) | RESPIRATORY_TRACT | 0 refills | Status: DC

## 2016-04-21 NOTE — Patient Instructions (Signed)
Continue on Symbicort or Dulera 2 puffs Twice daily   Bring Formulary -drug to next office visit  Follow up with Dr. Melvyn Novas  In 2 months and As needed

## 2016-04-21 NOTE — Assessment & Plan Note (Signed)
Flare now resolved   Plan  Patient Instructions  Continue on Symbicort or Dulera 2 puffs Twice daily   Bring Formulary -drug to next office visit  Follow up with Dr. Melvyn Novas  In 2 months and As needed

## 2016-04-21 NOTE — Progress Notes (Signed)
@Patient  ID: [name], female    DOB: [birth year], 65 y.o.   MRN: [id number]  Chief Complaint  Patient presents with  . Follow-up    Asthma     Referring provider: [name], MD  HPI: 65 yo female former smoker (quit 2005) , nml PFT 2012 followed for Asthma/VCD/UACS    TEST   FEN0 01/24/2016 = 13 with active symptoms on symb 160 2bid > changed to dulera 100  - Spirometry 02/23/2016  NO signifiant obstruction with active symptoms  - 02/23/2016  After extensive coaching HFA effectiveness =    75%  - FENO 02/23/2016  =  12   On dulera 100 2bid (unable to confirm adeherence as did not bring it - Singulair 10 mg daily added 02/23/2016 to dulera 100 2bid with 2 week sample only  (unable to verify she used it) - 04/07/2016  After extensive coaching HFA effectiveness =    75% > symb 80 x one sample given - FENO 04/07/2016  =   43    04/21/2016 Follow up ; Asthma  Pt returns for a 2 week follow up for asthma flare  She was changed to Symbicort to help with cost but insurance will only cover partial inahlers. They cost her $180 , Dulera and Symbiocrt. We talked about changing to nebs she would like to wait and check her formulary. Says she has a lot of samples.  FENO last ov was elevated at 43.  Feeling better with cough is resolved. Does have some intermittent wheezing .      Allergies  Allergen Reactions  . Amlodipine Besylate Other (See Comments)    Reaction unknown  . Lisinopril Other (See Comments)    Reaction unknown  . Pantoprazole Sodium Other (See Comments)    Reaction unknown    Immunization History  Administered Date(s) Administered  . Influenza Split 03/10/2012, 03/14/2015  . Influenza-Unspecified 01/11/2016    Past Medical History:  Diagnosis Date  . Anxiety   . Asthma   . Bronchitis   . Chronic diastolic CHF (congestive heart failure) ([place])   . Coronary artery disease    a. NSTEMI 12/2015 - LHC 01/18/16: s/p overlapping DESx2 to RCA, 10% ost-prox  Cx,10% mLAD.  Marland Kitchen Depression   . Diabetes mellitus   . Dyslipidemia   . GERD (gastroesophageal reflux disease)   . Heart attack   . Hypertension   . Hypertensive heart disease   . NSTEMI (non-ST elevated myocardial infarction) ([place]) 12/2015  . Pneumonia     Tobacco History: History  Smoking Status  . Former Smoker  . Packs/day: 1.00  . Years: 30.00  . Types: Cigarettes  . Quit date: 01/09/2004  Smokeless Tobacco  . Never Used   Counseling given: Not Answered   Outpatient Encounter Prescriptions as of 04/21/2016  Medication Sig  . acetaminophen-codeine (TYLENOL #3) 300-30 MG tablet One every 4 hours as needed for cough  . albuterol (PROVENTIL HFA;VENTOLIN HFA) 108 (90 Base) MCG/ACT inhaler Inhale 2 puffs into the lungs every 6 (six) hours as needed for wheezing or shortness of breath.  Marland Kitchen albuterol (PROVENTIL) (2.5 MG/3ML) 0.083% nebulizer solution Take 3 mLs (2.5 mg total) by nebulization every 4 (four) hours as needed for shortness of breath.  . ALPRAZolam (XANAX) 0.5 MG tablet Take 1 tablet (0.5 mg total) by mouth 2 (two) times daily as needed for anxiety.  Marland Kitchen aspirin EC 81 MG tablet Take 81 mg by mouth every morning.   Marland Kitchen atorvastatin (LIPITOR)  80 MG tablet Take 1 tablet (80 mg total) by mouth every morning.  . budesonide-formoterol (SYMBICORT) 80-4.5 MCG/ACT inhaler Take 2 puffs first thing in am and then another 2 puffs about 12 hours later.  Marland Kitchen BYSTOLIC 5 MG tablet Take 5 mg by mouth daily.  Marland Kitchen dextromethorphan (DELSYM) 30 MG/5ML liquid Take by mouth as needed for cough.  . diltiazem (DILACOR XR) 180 MG 24 hr capsule Take 180 mg by mouth daily.  . divalproex (DEPAKOTE ER) 500 MG 24 hr tablet Take 1 tablet (500 mg total) by mouth at bedtime.  Marland Kitchen esomeprazole (NEXIUM) 40 MG capsule Take 40 mg by mouth every morning.   Marland Kitchen estradiol (ESTRACE) 1 MG tablet Take 1 mg by mouth every morning.   . famotidine (PEPCID) 20 MG tablet Take 20 mg by mouth daily.  . furosemide (LASIX) 40 MG  tablet Take 1 tablet (40 mg total) by mouth daily.  Marland Kitchen gabapentin (NEURONTIN) 300 MG capsule Take 1 capsule (300 mg total) by mouth 3 (three) times daily.  . metFORMIN (GLUCOPHAGE) 500 MG tablet Take 1 tablet (500 mg total) by mouth 2 (two) times daily with a meal.  . potassium chloride SA (K-DUR,KLOR-CON) 20 MEQ tablet Take 20 mEq by mouth every morning.   Marland Kitchen Respiratory Therapy Supplies (FLUTTER) DEVI Use as directed  . sodium chloride (OCEAN) 0.65 % SOLN nasal spray Place 1 spray into both nostrils as needed for congestion.  . ticagrelor (BRILINTA) 90 MG TABS tablet Take 1 tablet (90 mg total) by mouth every 12 (twelve) hours.  . valsartan (DIOVAN) 320 MG tablet Take 1 tablet (320 mg total) by mouth daily.  . predniSONE (STERAPRED UNI-PAK 48 TAB) 10 MG (48) TBPK tablet As directed   No facility-administered encounter medications on file as of 04/21/2016.      Review of Systems  Constitutional:   No  weight loss, night sweats,  Fevers, chills, fatigue, or  lassitude.  HEENT:   No headaches,  Difficulty swallowing,  Tooth/dental problems, or  Sore throat,                No sneezing, itching, ear ache,  +nasal congestion, post nasal drip,   CV:  No chest pain,  Orthopnea, PND, swelling in lower extremities, anasarca, dizziness, palpitations, syncope.   GI  No heartburn, indigestion, abdominal pain, nausea, vomiting, diarrhea, change in bowel habits, loss of appetite, bloody stools.   Resp: No shortness of breath with exertion or at rest.  No excess mucus, no productive cough,  No non-productive cough,  No coughing up of blood.  No change in color of mucus.  No wheezing.  No chest wall deformity  Skin: no rash or lesions.  GU: no dysuria, change in color of urine, no urgency or frequency.  No flank pain, no hematuria   MS:  No joint pain or swelling.  No decreased range of motion.  No back pain.    Physical Exam  BP (!) 142/78 (BP Location: Left Arm, Cuff Size: Normal)   Pulse 69    SpO2 100%   GEN: A/Ox3; pleasant , NAD, well nourished    HEENT:  /AT,  EACs-clear, TMs-wnl, NOSE-clear, THROAT-clear, no lesions, no postnasal drip or exudate noted.   NECK:  Supple w/ fair ROM; no JVD; normal carotid impulses w/o bruits; no thyromegaly or nodules palpated; no lymphadenopathy.    RESP  Clear  P & A; w/o, wheezes/ rales/ or rhonchi. no accessory muscle use, no dullness to percussion  CARD:  RRR, no m/r/g, no peripheral edema, pulses intact, no cyanosis or clubbing.  GI:   Soft & nt; nml bowel sounds; no organomegaly or masses detected.   Musco: Warm bil, no deformities or joint swelling noted.   Neuro: alert, no focal deficits noted.    Skin: Warm, no lesions or rashes  Psych:  No change in mood or affect. No depression or anxiety.  No memory loss.  Lab Results:  CBC    Component Value Date/Time   WBC 5.3 03/23/2016 2117   RBC 3.23 (L) 03/23/2016 2117   HGB 10.3 (L) 03/23/2016 2117   HCT 31.0 (L) 03/23/2016 2117   PLT 187 03/23/2016 2117   MCV 96.0 03/23/2016 2117   MCH 31.9 03/23/2016 2117   MCHC 33.2 03/23/2016 2117   RDW 14.8 03/23/2016 2117   LYMPHSABS 4.2 (H) 02/27/2016 1106   MONOABS 1.1 (H) 02/27/2016 1106   EOSABS 0.0 02/27/2016 1106   BASOSABS 0.1 02/27/2016 1106    BMET    Component Value Date/Time   NA 137 03/23/2016 2117   K 3.6 03/23/2016 2117   CL 102 03/23/2016 2117   CO2 26 03/23/2016 2117   GLUCOSE 186 (H) 03/23/2016 2117   BUN 16 03/23/2016 2117   CREATININE 1.18 (H) 03/23/2016 2117   CREATININE 1.06 (H) 01/26/2016 1608   CALCIUM 8.3 (L) 03/23/2016 2117   GFRNONAA 47 (L) 03/23/2016 2117   GFRAA 54 (L) 03/23/2016 2117    BNP    Component Value Date/Time   BNP 44.0 02/27/2016 1107   BNP <4.0 01/26/2016 1608    ProBNP    Component Value Date/Time   PROBNP 13.0 02/23/2016 1122    Imaging: Dg Chest 2 View  Result Date: 03/23/2016 CLINICAL DATA:  Epigastric pain this afternoon. Shortness of breath for 1 month.  Cough beginning this morning. History of recent MI in September 2017. EXAM: CHEST  2 VIEW COMPARISON:  02/27/2016 FINDINGS: Normal heart size and pulmonary vascularity. No focal airspace disease or consolidation in the lungs. No blunting of costophrenic angles. No pneumothorax. Mediastinal contours appear intact. Tortuous aorta IMPRESSION: No active cardiopulmonary disease. Electronically Signed   By: Lucienne Capers M.D.   On: 03/23/2016 21:40     Assessment & Plan:   No problem-specific Assessment & Plan notes found for this encounter.     Rexene Edison, NP 04/21/2016

## 2016-04-22 NOTE — Progress Notes (Signed)
Chart and office note reviewed in detail  > agree with a/p as outlined    

## 2016-04-26 ENCOUNTER — Telehealth: Payer: Self-pay | Admitting: *Deleted

## 2016-04-26 ENCOUNTER — Ambulatory Visit (HOSPITAL_COMMUNITY): Payer: Self-pay

## 2016-04-26 NOTE — Telephone Encounter (Signed)
-----   Message from Tanda Rockers, MD sent at 04/22/2016  7:06 AM EST ----- Need to move up ov to mid jan with all meds in hand so we can make sure she doesn't end up back in the ER

## 2016-04-26 NOTE — Telephone Encounter (Signed)
LMTCB

## 2016-04-28 ENCOUNTER — Ambulatory Visit (HOSPITAL_COMMUNITY): Payer: Self-pay

## 2016-05-02 NOTE — Telephone Encounter (Signed)
LMTCB

## 2016-05-03 ENCOUNTER — Ambulatory Visit (HOSPITAL_COMMUNITY): Payer: Self-pay

## 2016-05-03 NOTE — Telephone Encounter (Signed)
LMTCB

## 2016-05-05 ENCOUNTER — Ambulatory Visit (HOSPITAL_COMMUNITY): Payer: Self-pay

## 2016-05-08 ENCOUNTER — Encounter: Payer: Self-pay | Admitting: Internal Medicine

## 2016-05-08 ENCOUNTER — Ambulatory Visit (HOSPITAL_COMMUNITY): Payer: Self-pay

## 2016-05-08 ENCOUNTER — Ambulatory Visit: Payer: Self-pay | Admitting: Internal Medicine

## 2016-05-08 ENCOUNTER — Ambulatory Visit (INDEPENDENT_AMBULATORY_CARE_PROVIDER_SITE_OTHER): Payer: Medicare Other | Admitting: Internal Medicine

## 2016-05-08 VITALS — BP 128/80 | HR 72 | Ht 62.0 in | Wt 168.0 lb

## 2016-05-08 DIAGNOSIS — R05 Cough: Secondary | ICD-10-CM

## 2016-05-08 DIAGNOSIS — J4530 Mild persistent asthma, uncomplicated: Secondary | ICD-10-CM

## 2016-05-08 DIAGNOSIS — R058 Other specified cough: Secondary | ICD-10-CM

## 2016-05-08 NOTE — Progress Notes (Signed)
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 Asthma     Brief history of present illness from the day of admission and additional interim summary    [name] a 66 y.o.femalewith medical history significant for hypertension, diabetes on metformin, history of prior PE and asthma. This past Thursday patient began having upper respiratory infection symptoms with cough and congestion with productive yellow sputum. Because of this she developed asthma exacerbation symptoms which are typical for her with increased wheezing and tightness. She is not had fevers or chills. She's not had any GI symptoms such as nausea, vomiting or diarrhea. She went to her PCP who prescribed prednisone. In addition she utilized her normal nebulizers and MDIs without improvement in her symptoms. Because of increased respiratory distress she presented to the ER today. No documentation from ER to determine if patient was hypoxemic at presentation. She was wheezing significantly upon arrival and becoming dyspneic with talking. She was also reporting pleuritic chest tightness with taking a deep breath.   She has been exposed to multiple sick contacts with similar symptoms prior to onset of her symptoms.She was positive for coronavirus which likely  exacerbated her asthma, also had NSTEMI requiring left heart cath and stent placement.   Hospital issues addressed     1. Acute on chronic asthma exacerbation. She still wheezing, continue IV steroids, added Singulair, added nebulizer treatments both scheduled and as needed, will add Dulera as well and monitor. Respiratory panel positive for coronavirus likely URI exacerbated her asthma + dCHF, SOB better, she has no oxygen requirement for the last 2 days, she still has minimal wheezing but this is mostly upper airway coming from her throat, will be placed on steroid taper, continue home nebulizer treatments and follow with Dr. Melvyn Novas her pulmonary physician in a week.  2. Hypertension. Hydralazine dose doubled, ARB + Cardizem. Follow with PCP for blood pressure control.  3. History of PE. Normal d-dimer.  4. Hypokalemia. Replaced and stable.  5. GERD. On PPI.  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 Continue statin & Cardizem, no B.Blocker due to Asthma, pain free. All with cardiology outpatient.  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  8. DM2 - continue Home rx.   Discharge diagnosis     Principal Problem:   Acute bronchitis with asthma with acute exacerbation Active Problems:   Diabetes mellitus type 2, uncontrolled ([facility])   Essential  hypertension   G E R D   PULMONARY EMBOLISM, HX OF   Hyperlipidemia   Chest pain   Acute hypokalemia   Hypomagnesemia   Acute respiratory failure (H[facility])   NSTEMI (non-ST elevated myocardial infarction) (HCC)   CAD in native artery   Hypertensive heart disease    01/24/2016  Extended post hosp f/u ov/ transition of care/ /[name] re: dtca asthma/ vcd symb 160 and pred 5  x 10 mg daily  Chief Complaint  Patient presents with  . Follow-up    Pt. recently got out the hopistal for an asthma attack, Pt. states her breathing remains the same, coughing with some yellow to white mucus,Has been needing to use Ventolin more often  last doing well 2 months prior to OV   While on Symb 160/ gerd rx but not dosing ac as rec  Waking up prematurely x 2 months with cough/wheeze/ sob but never contacted me as per the instructions rec Plan A = Automatic = dulera 100 Take 2 puffs first thing in am and then another 2 puffs about 12 hours later.                                      Nexum 40 mg Take 30-60 min before first meal of the day and Pepcid AC 20 mg  at bedtime Work on inhaler technique  Plan B = Backup Only use your albuterol as a rescue medication Plan C = Crisis - only use your albuterol nebulizer if you first try Plan B and it fails to help > ok to use the nebulizer up to every 4 hours but if start needing it regularly call for immediate appointment GERD diet  Take delsym two tsp every 12 hours and use the flutter valve as much as possible and supplement if needed with  tramadol 50 mg up to 2 every 4 hours to suppress the urge to cough  Once you have eliminated the cough for 3 straight days try reducing the tramadol first,  then the delsym as tolerated.   Prednisone 5 mg x 8, then 6, then 4 then 2, then 1 daily x 2 days and off  please schedule a follow up office visit in 2 weeks, sooner if needed with all meds/ inhalers/ neb solutions in hand     02/23/2016  f/u ov/[name] re: dtca vs vcd/ did not bring meds as req Chief Complaint  Patient presents with  . Follow-up    Cough had stopped and then resumed again 1 day ago. She states she woke up coughing at 2 am- non prod and also having chest tightness.   husband says throat clearing never stops x years  Some better on pred but flared w/in a few days of stopping it   rec Plan A = Automatic = dulera 100 Take 2 puffs  first thing in am and then another 2 puffs about 12 hours later.                                      Nexum 40 mg Take 30-60 min before first meal of the day and Pepcid AC 20 mg  at bedtime  Add neurontin (gabapentin) 100 mg three times daily  Work on perfecting  inhaler technique: Plan B = Backup Only use your albuterol as a rescue medication Plan C = Crisis - only use your albuterol nebulizer Take delsym two tsp every 12 hours and use the flutter valve as much as possible and supplement if needed with  Tylenol #3   up to 1-2 every 4 hours to suppress the urge to cough  Once you have eliminated the cough for 3 straight days try reducing the tylenol #3  first,  then the delsym as tolerated.   Prednisone 10 mg take  4 each am x 2 days,   2 each am x 2 days,  1 each am x 2 days and stop  Please remember to go to the lab  department downstairs for your tests - we will call you with the results when they are available. Please schedule a follow up office visit in 2 weeks, sooner if needed with all meds/ inhalers/ neb solutions in hand to see [name] NP for med calendar Late add : given dulera 100 x one sample = 60 pffs - check count on return   02/27/16 admit > no better at all    03/10/2016 NP Post hospital follow up  She presents for a post hospital follow-up Patient was admitted October 29 through November 2 for an asthma exacerbation  treated with IV steroids, and transitioned over to prednisone. At discharge. D-dimer was negative. Venous Dopplers were negative. She is feeling some better, cough and wheezing are decreased.  Does have some post nasal drainage. rec Add Zyrtec 10mg . At bedtime   Saline nasal rinses As needed   Delsym 2 tsp Twice daily  As needed  Cough  Finish Prednisone as directed.  Follow up with Dr. Melvyn Novas  In 4 weeks and As needed   Please contact office for sooner follow up if symptoms do not improve or worsen or seek emergency care         04/07/2016  exteneded f/u ov/[name] re:  Cough/wheezing and sob since July 2017 with nl spirometry today  Chief Complaint  Patient presents with  . Follow-up    Coughing and wheezing "all day"- cough is occ prod with yellow sputum and she has also had yellow nasal d/c. Her breathing is "so so". She is using her albuterol inhaler every 4 hours "I always need it".  She had been using neb 2 x daily, but has not used for the past several days.   no meds/ never got calendar/ not clear at all what she's taking other than way too much saba rec Stop dulera for now and continue symbicort 80 2bid  Work on inhaler technique:  relax and gently blow all the way out then take a nice smooth deep breath back in, triggering the inhaler at same time you start breathing in.  Hold for up to 5 seconds if you can. Blow out thru nose. Rinse and gargle with water when done For cough > cough into the flutter valve and increase the neurontin to 300 mg three times daily  Augmentin 875 mg take one pill twice daily  X 10 days - take at breakfast and supper with large glass of water.  It would help reduce the usual side effects (diarrhea and yeast infections) if you ate cultured yogurt at lunch.  Plan A = Automatic = Symbicort 80 Take 2 puffs first thing in am and then another 2 puffs about 12 hours  later.  Plan B = Backup Only use your albuterol (Proair) as a rescue medication to be used if you can't catch your breath by resting or doing a relaxed purse lip breathing pattern.  - The less you use it, the better it will work when you need it. - Ok to use the inhaler up to 2 puffs  every 4 hours if you must but call for appointment if use goes up over your usual need - Don't leave home without it !!  (think of it like the spare tire for your car)  Plan C = Crisis - only use your albuterol nebulizer if you first try Plan B and it fails to help > ok to use the nebulizer up to every 4 hours but if start needing it regularly call  for immediate appointment    05/08/2016  f/u ov/[name] re: asthma/ vcd on symb 80 2bid / gabapentin 300 tid  Chief Complaint  Patient presents with  . Follow-up    1 mo f/u. No asthma flare ups since last visit. No breathing issues. Denies any SOB or chest pains.   Not limited by breathing from desired activities     No obvious patterns in  day to day or daytime variability or assoc  excess/ purulent sputum or mucus plugs  or cp or chest tightness, subjective wheeze or overt sinus or hb symptoms. No unusual exp hx or h/o childhood pna/ asthma or knowledge of premature birth.  Sleeping ok without nocturnal  or early am exacerbation  of respiratory  c/o's or need for noct saba. Also denies any obvious fluctuation of symptoms with weather or environmental changes or other aggravating or alleviating factors except as outlined above   Current Medications, Allergies, Complete Past Medical History, Past Surgical History, Family History, and Social History were reviewed in [employer] record.  ROS  The following are not active complaints unless bolded sore throat, dysphagia, dental problems, itching, sneezing,  nasal congestion or excess/ purulent secretions, ear ache,   fever, chills, sweats, unintended wt loss, classically pleuritic or exertional cp,  orthopnea pnd or leg swelling, presyncope, palpitations, abdominal pain, anorexia, nausea, vomiting, diarrhea  or change in bowel or bladder habits, change in stools or urine, dysuria,hematuria,  rash, arthralgias, visual complaints, headache, numbness, weakness or ataxia or problems with walking or coordination,  change in mood/affect or memory.                  Objective:   Physical Exam  amb bf nad     02/23/2016   168 >  04/07/2016  168 > 05/08/2016    168    Vital signs reviewed  - Note on arrival 02 sats  93% on RA     HEENT: nl dentition, turbinates, and orophanx. Nl external ear canals without cough  reflex   NECK :  without JVD/Nodes/TM/ nl carotid upstrokes bilaterally   LUNGS: no acc muscle use,  Still with  transmitted upper airway noise but not true wheeze     CV:  RRR  no s3 or murmur or increase in P2, no edema   ABD:  soft and nontender with nl excursion in the supine position. No bruits or organomegaly, bowel sounds nl  MS:  warm without deformities, calf tenderness, cyanosis or clubbing  SKIN: warm and dry without lesions    NEURO:  alert, approp, no deficits       I personally reviewed images and agree with radiology impression  as follows:  CXR:   03/23/16 No active cardiopulmonary disease.

## 2016-05-08 NOTE — Patient Instructions (Signed)
No change in medications  Work on inhaler technique:  relax and gently blow all the way out then take a nice smooth deep breath back in, triggering the inhaler at same time you start breathing in.  Hold for up to 5 seconds if you can. Blow out thru nose. Rinse and gargle with water when done    See [name] NP w/in 4 weeks with all your medications, even over the counter meds, separated in two separate bags, the ones you take no matter what vs the ones you stop once you feel better and take only as needed when you feel you need them.   [name]  will generate for you a new user friendly medication calendar that will put [place] all on the same page re: your medication use.     Without this process, it simply isn't possible to assure that we are providing  your outpatient care  with  the attention to detail we feel you deserve.   If we cannot assure that you're getting that kind of care,  then we cannot manage your problem effectively from this clinic.  Once you have seen [name] and we are sure that we're all on the same page with your medication use she will arrange follow up with me.

## 2016-05-08 NOTE — Telephone Encounter (Signed)
appt scheduled for today

## 2016-05-10 ENCOUNTER — Ambulatory Visit (HOSPITAL_COMMUNITY): Payer: Self-pay

## 2016-05-10 ENCOUNTER — Ambulatory Visit (INDEPENDENT_AMBULATORY_CARE_PROVIDER_SITE_OTHER): Payer: Medicare Other | Admitting: Podiatry

## 2016-05-10 ENCOUNTER — Encounter: Payer: Self-pay | Admitting: Physician Assistant

## 2016-05-10 ENCOUNTER — Encounter: Payer: Self-pay | Admitting: Podiatry

## 2016-05-10 DIAGNOSIS — E119 Type 2 diabetes mellitus without complications: Secondary | ICD-10-CM | POA: Diagnosis not present

## 2016-05-10 DIAGNOSIS — B351 Tinea unguium: Secondary | ICD-10-CM | POA: Diagnosis not present

## 2016-05-10 NOTE — Progress Notes (Signed)
   Subjective:    Patient ID: [name], female    DOB: [birth year], 66 y.o.   MRN: [id number]  HPI   I AM HERE TO GET MY TOENAILS TRIMMED UP AND MY YEARLY EXAM AND I HAD A HEART ATTACK LAST September AND HAD STENTS PUT IN THE DAY AFTER AND 3 ASTHMA ATTACKS WHILE I WAS IN THE HOSPITAL AND A SEIZURE AND MY RIGHT FOOT AND ANKLE SWELL SOME  As patient presents today requesting toenail debridement. She is complaining of uncomfortable toenails when walking wearing shoes and she last presented for this service on 10/20/2015. Patient has history of MI and coronary artery stenting since his visit. 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 Denies any history of foot ulceration, claudication or amputation   Review of Systems  All other systems reviewed and are negative.      Objective:   Physical Exam  Orientated 3  Vascular: No calf tenderness or peripheral edema bilaterally DP pulses 2/4 bilaterally PT pulses 2/4 bilaterally Capillary reflex immediate bilaterally  Neurological: Sensation to 10 g monofilament wire intact 3/5 right 4/5 left Vibratory sensation reactive bilaterally Ankle reflexes reactive bilaterally  Dermatological: No open skin lesions bilaterally Surgical scar dorsal right fourth toe area The toenails are extremely elongated, brittle, discolored, hypertrophic 6-10  Musculoskeletal: Pes planus bilaterally HAV right Patient has stable gait No restriction ankle, subtalar, midtarsal joints bilaterally        Assessment & Plan:   Assessment: Diabetic without foot complications Satisfactory neurovascular status Neglected symptomatic onychomycoses 6-10  Plan: Debridement of toenails 6-10 mechanical he an elective without any bleeding  Reappoint 3 months

## 2016-05-10 NOTE — Patient Instructions (Signed)

## 2016-05-11 ENCOUNTER — Encounter: Payer: Self-pay | Admitting: Cardiology

## 2016-05-11 ENCOUNTER — Encounter: Payer: Self-pay | Admitting: Neurology

## 2016-05-11 ENCOUNTER — Ambulatory Visit (INDEPENDENT_AMBULATORY_CARE_PROVIDER_SITE_OTHER): Payer: Medicare Other | Admitting: Neurology

## 2016-05-11 ENCOUNTER — Ambulatory Visit (INDEPENDENT_AMBULATORY_CARE_PROVIDER_SITE_OTHER): Payer: Medicare Other | Admitting: Cardiology

## 2016-05-11 VITALS — BP 128/64 | HR 70 | Ht 62.0 in | Wt 171.0 lb

## 2016-05-11 VITALS — BP 138/80 | HR 64 | Ht 62.0 in | Wt 171.8 lb

## 2016-05-11 DIAGNOSIS — I5032 Chronic diastolic (congestive) heart failure: Secondary | ICD-10-CM | POA: Diagnosis not present

## 2016-05-11 DIAGNOSIS — E7800 Pure hypercholesterolemia, unspecified: Secondary | ICD-10-CM | POA: Diagnosis not present

## 2016-05-11 DIAGNOSIS — R569 Unspecified convulsions: Secondary | ICD-10-CM

## 2016-05-11 DIAGNOSIS — I2510 Atherosclerotic heart disease of native coronary artery without angina pectoris: Secondary | ICD-10-CM | POA: Diagnosis not present

## 2016-05-11 DIAGNOSIS — I10 Essential (primary) hypertension: Secondary | ICD-10-CM | POA: Diagnosis not present

## 2016-05-11 NOTE — Progress Notes (Signed)
PATIENT: [name] DOB: 25-Feb-1951  Chief Complaint  Patient presents with  . Episoded of confusion    She is still taking Depakote ER 500mg , qhs.  No futher confusion spells.  She would like to review her MRI and EEG.     HISTORICAL  [name] is a 66 years old left-handed female, seen in refer by her primary care doctor [name] for evaluation of episodes of confusion on January 27 2016,  I reviewed and summarized the referring note, she had a history of type 2 diabetes, hypertension, hyperlipidemia, pulmonary emboli and, depression, COPD, coronary artery disease, stent placement following a heart attack in January 18 2016, I reviewed echo report on January 18 2016, ejection fraction 65-70%, severe concentric hypertrophy, septal wall thickness was increased, with mild hypertrophy of the posterior wall, abnormal relaxation, increased filling pressure,  I also reviewed laboratory evaluation BMP showed elevated glucose 270, creatinine 1.06, cholesterol 207, LDL 110, A1c 7.0,  She had her first generalized seizure in 2011, was taken to [place] evaluated by neurologist there, per patient, there was a suggestion of antiepileptic medications, however it was never followed through.  Over the years, she had no generalized seizure, but every few weeks to every few months, she would have episodes of sudden onset confusion, space out, lasting less than 5 minutes  She understands through extreme stress (her grandson died) in 2015/06/27, since then, she began to have increased spells, almost daily basis now, she noted when she was reading, cooking, has transient time elapsed, she came to confused, sometimes preceded by nausea dry mouth sensation.  She denies family history of seizure, she works as a Midwife at [employer].  UPDATE May 11 2016:  She is now taking Depakote ER 500 mg every night, which has helped her tremendously, she no longer has  recurrent spells,  EEG was normal in October 2017  We have personally reviewed MRI of the brain in November 2017, multiple periventricular and subcortical punctate scattered chronic small vessel disease, increased T2 flare hyperintensity in the mesial temporal lobe, right hippocampus is slightly smaller than the left,   REVIEW OF SYSTEMS: Full 14 system review of systems performed and notable only for as above  ALLERGIES: Allergies  Allergen Reactions  . Amlodipine Besylate Other (See Comments)    Reaction unknown  . Lisinopril Other (See Comments)    Reaction unknown  . Pantoprazole Sodium Other (See Comments)    Reaction unknown    HOME MEDICATIONS: Current Outpatient Prescriptions  Medication Sig Dispense Refill  . acetaminophen-codeine (TYLENOL #3) 300-30 MG tablet One every 4 hours as needed for cough 40 tablet 0  . albuterol (PROVENTIL HFA;VENTOLIN HFA) 108 (90 Base) MCG/ACT inhaler Inhale 2 puffs into the lungs every 6 (six) hours as needed for wheezing or shortness of breath. 1 Inhaler 5  . albuterol (PROVENTIL) (2.5 MG/3ML) 0.083% nebulizer solution Take 3 mLs (2.5 mg total) by nebulization every 4 (four) hours as needed for shortness of breath. 500 mL 1  . ALPRAZolam (XANAX) 0.5 MG tablet Take 1 tablet (0.5 mg total) by mouth 2 (two) times daily as needed for anxiety. 30 tablet 0  . aspirin EC 81 MG tablet Take 81 mg by mouth every morning.     Marland Kitchen atorvastatin (LIPITOR) 80 MG tablet Take 1 tablet (80 mg total) by mouth every morning. 30 tablet 0  . budesonide-formoterol (SYMBICORT) 80-4.5 MCG/ACT inhaler Take 2 puffs first thing in am and  then another 2 puffs about 12 hours later. 1 Inhaler 11  . BYSTOLIC 5 MG tablet Take 5 mg by mouth daily.    Marland Kitchen dextromethorphan (DELSYM) 30 MG/5ML liquid Take by mouth as needed for cough.    . diltiazem (DILACOR XR) 180 MG 24 hr capsule Take 180 mg by mouth daily.    . divalproex (DEPAKOTE ER) 500 MG 24 hr tablet Take 1 tablet (500 mg  total) by mouth at bedtime. 30 tablet 11  . esomeprazole (NEXIUM) 40 MG capsule Take 40 mg by mouth every morning.     Marland Kitchen estradiol (ESTRACE) 1 MG tablet Take 1 mg by mouth every morning.     . famotidine (PEPCID) 20 MG tablet Take 20 mg by mouth daily.    . furosemide (LASIX) 40 MG tablet Take 1 tablet (40 mg total) by mouth daily. 90 tablet 3  . gabapentin (NEURONTIN) 300 MG capsule Take 1 capsule (300 mg total) by mouth 3 (three) times daily. 90 capsule 11  . metFORMIN (GLUCOPHAGE) 500 MG tablet Take 1 tablet (500 mg total) by mouth 2 (two) times daily with a meal. 60 tablet 0  . potassium chloride SA (K-DUR,KLOR-CON) 20 MEQ tablet Take 20 mEq by mouth every morning.     Marland Kitchen Respiratory Therapy Supplies (FLUTTER) DEVI Use as directed 1 each 0  . sodium chloride (OCEAN) 0.65 % SOLN nasal spray Place 1 spray into both nostrils as needed for congestion.    . ticagrelor (BRILINTA) 90 MG TABS tablet Take 1 tablet (90 mg total) by mouth every 12 (twelve) hours. 180 tablet 3  . valsartan (DIOVAN) 320 MG tablet Take 1 tablet (320 mg total) by mouth daily. 90 tablet 3   No current facility-administered medications for this visit.     PAST MEDICAL HISTORY: Past Medical History:  Diagnosis Date  . Anxiety   . Asthma   . Bronchitis   . Chronic diastolic CHF (congestive heart failure) ([facility])   . Coronary artery disease    a. NSTEMI 12/2015 - LHC 01/18/16: s/p overlapping DESx2 to RCA, 10% ost-prox Cx,10% mLAD.  Marland Kitchen Depression   . Diabetes mellitus   . Dyslipidemia   . GERD (gastroesophageal reflux disease)   . Heart attack   . Hypertension   . Hypertensive heart disease   . NSTEMI (non-ST elevated myocardial infarction) ([place] [facility]) 12/2015  . Pneumonia     PAST SURGICAL HISTORY: Past Surgical History:  Procedure Laterality Date  . ABDOMINAL HYSTERECTOMY    . CARDIAC CATHETERIZATION  1994   minimal LAD dz, no other dz, EF normal  . CARDIAC CATHETERIZATION N/A 01/18/2016   Procedure: Left Heart Cath  and Coronary Angiography;  Surgeon: Burnell Blanks, MD;  Location: [facility];  Service: Cardiovascular;  Laterality: N/A;  . CARDIAC CATHETERIZATION N/A 01/18/2016   Procedure: Coronary Stent Intervention;  Surgeon: Burnell Blanks, MD;  Location: [facility];  Service: Cardiovascular;  Laterality: N/A;  . COLONOSCOPY WITH PROPOFOL N/A 04/09/2014   Procedure: COLONOSCOPY WITH PROPOFOL;  Surgeon: Cleotis Nipper, MD;  [facility];  Service: Endoscopy;  Laterality: N/A;  . CORONARY STENT PLACEMENT  01/19/2016   STENT SYNERGY DES [id number] drug eluting stent was successfully placed, and overlaps the 2.5 x 38 mm Synergy stent placed distally.  . ESOPHAGOGASTRODUODENOSCOPY (EGD) WITH PROPOFOL N/A 04/09/2014   Procedure: ESOPHAGOGASTRODUODENOSCOPY (EGD) WITH PROPOFOL;  Surgeon: Cleotis Nipper, MD;  [facility];  Service: Endoscopy;  Laterality: N/A;  .  HEMORRHOID SURGERY    . HERNIA REPAIR    . KNEE ARTHROSCOPY      FAMILY HISTORY: Family History  Problem Relation Age of Onset  . Allergies Mother   . Asthma Mother   . CAD Father 48  . Diabetes Father   . Hypertension Father   . Congestive Heart Failure Sister     died in her 13s  . CAD Sister 69    SOCIAL HISTORY:  Social History   Social History  . Marital status: Married    Spouse name: N/A  . Number of children: 3  . Years of education: some college   Occupational History  . Merchandiser    Social History Main Topics  . Smoking status: Former Smoker    Packs/day: 1.00    Years: 30.00    Types: Cigarettes    Quit date: 01/09/2004  . Smokeless tobacco: Never Used  . Alcohol use No  . Drug use: No  . Sexual activity: Not on file   Other Topics Concern  . Not on file   Social History Narrative   Pt lives with husband in [place].   Right-handed.   2 cups caffeine per day.     PHYSICAL EXAM   Vitals:   05/11/16 1314  BP: 128/64  Pulse: 70  Weight: 171 lb  (77.6 kg)  Height: 5\' 2"  (1.575 m)    Not recorded      Body mass index is 31.28 kg/m.  PHYSICAL EXAMNIATION:  Gen: NAD, conversant, well nourised, obese, well groomed                     Cardiovascular: Regular rate rhythm, no peripheral edema, warm, nontender. Eyes: Conjunctivae clear without exudates or hemorrhage Neck: Supple, no carotid bruise. Pulmonary: Clear to auscultation bilaterally   NEUROLOGICAL EXAM:  MENTAL STATUS: Speech:    Speech is normal; fluent and spontaneous with normal comprehension.  Cognition:     Orientation to time, place and person     Normal recent and remote memory     Normal Attention span and concentration     Normal Language, naming, repeating,spontaneous speech     Fund of knowledge   CRANIAL NERVES: CN II: Visual fields are full to confrontation. Fundoscopic exam is normal with sharp discs and no vascular changes. Pupils are round equal and briskly reactive to light. CN III, IV, VI: extraocular movement are normal. No ptosis. CN V: Facial sensation is intact to pinprick in all 3 divisions bilaterally. Corneal responses are intact.  CN VII: Face is symmetric with normal eye closure and smile. CN VIII: Hearing is normal to rubbing fingers CN IX, X: Palate elevates symmetrically. Phonation is normal. CN XI: Head turning and shoulder shrug are intact CN XII: Tongue is midline with normal movements and no atrophy.  MOTOR: There is no pronator drift of out-stretched arms. Muscle bulk and tone are normal. Muscle strength is normal.  REFLEXES: Reflexes are 2+ and symmetric at the biceps, triceps, knees, and ankles. Plantar responses are flexor.  SENSORY: Intact to light touch, pinprick, positional sensation and vibratory sensation are intact in fingers and toes.  COORDINATION: Rapid alternating movements and fine finger movements are intact. There is no dysmetria on finger-to-nose and heel-knee-shin.    GAIT/STANCE: Posture is normal.  Gait is steady with normal steps, base, arm swing, and turning. Heel and toe walking are normal. Tandem gait is normal.  Romberg is absent.   DIAGNOSTIC DATA (LABS, IMAGING,  TESTING) - I reviewed patient records, labs, notes, testing and imaging myself where available.   ASSESSMENT AND PLAN  COOKIE SCHMID is a 66 y.o. female   Complex partial seizure  Depakote ER 500mg  qhs.  No driving till seizure free for 6 months.    Marcial Pacas, M.D. Ph.D.  [facility] [address], [place], [place] [address] Ph: [phone] Fax: [phone]  CC: [name], MD

## 2016-05-11 NOTE — Progress Notes (Signed)
Cardiology Office Note    Date:  05/11/2016   ID:  [name], DOB [birth year], MRN [id number]  PCP:  [name], MD  Cardiologist:   Candee Furbish, MD     History of Present Illness:  [name] is a 66 y.o. female with coronary artery disease status post drug-eluting stent 2 to the distal RCA in September 2017 in the setting of non-ST elevation myocardial infarction with concomitant asthma, diabetes, hypertension, hyperlipidemia, prior pulmonary embolism and chronic diastolic heart failure.  Ejection fraction was normal 65-70% with mild mitral regurgitation. Bystolic utilized because of severe asthma.  She reported easy bruising.Mild to moderate in severity, all over her body at times. No major melena, hematemesis. This is an annoyance to her.  Denies chest pain. She enjoys working.    Past Medical History:  Diagnosis Date  . Anxiety   . Asthma   . Bronchitis   . Chronic diastolic CHF (congestive heart failure) ([place] [facility])   . Coronary artery disease    a. NSTEMI 12/2015 - LHC 01/18/16: s/p overlapping DESx2 to RCA, 10% ost-prox Cx,10% mLAD.  Marland Kitchen Depression   . Diabetes mellitus   . Dyslipidemia   . GERD (gastroesophageal reflux disease)   . Heart attack   . Hypertension   . Hypertensive heart disease   . NSTEMI (non-ST elevated myocardial infarction) ([place]) 12/2015  . Pneumonia     Past Surgical History:  Procedure Laterality Date  . ABDOMINAL HYSTERECTOMY    . CARDIAC CATHETERIZATION  1994   minimal LAD dz, no other dz, EF normal  . CARDIAC CATHETERIZATION N/A 01/18/2016   Procedure: Left Heart Cath and Coronary Angiography;  Surgeon: Burnell Blanks, MD;  Location: [facility];  Service: Cardiovascular;  Laterality: N/A;  . CARDIAC CATHETERIZATION N/A 01/18/2016   Procedure: Coronary Stent Intervention;  Surgeon: Burnell Blanks, MD;  Location: [facility];  Service: Cardiovascular;  Laterality: N/A;  . COLONOSCOPY WITH PROPOFOL N/A  04/09/2014   Procedure: COLONOSCOPY WITH PROPOFOL;  Surgeon: Cleotis Nipper, MD;  [facility];  Service: Endoscopy;  Laterality: N/A;  . CORONARY STENT PLACEMENT  01/19/2016   STENT SYNERGY DES [id number] drug eluting stent was successfully placed, and overlaps the 2.5 x 38 mm Synergy stent placed distally.  . ESOPHAGOGASTRODUODENOSCOPY (EGD) WITH PROPOFOL N/A 04/09/2014   Procedure: ESOPHAGOGASTRODUODENOSCOPY (EGD) WITH PROPOFOL;  Surgeon: Cleotis Nipper, MD;  [facility];  Service: Endoscopy;  Laterality: N/A;  . HEMORRHOID SURGERY    . HERNIA REPAIR    . KNEE ARTHROSCOPY      Current Medications: Outpatient Medications Prior to Visit  Medication Sig Dispense Refill  . acetaminophen-codeine (TYLENOL #3) 300-30 MG tablet One every 4 hours as needed for cough 40 tablet 0  . albuterol (PROVENTIL HFA;VENTOLIN HFA) 108 (90 Base) MCG/ACT inhaler Inhale 2 puffs into the lungs every 6 (six) hours as needed for wheezing or shortness of breath. 1 Inhaler 5  . albuterol (PROVENTIL) (2.5 MG/3ML) 0.083% nebulizer solution Take 3 mLs (2.5 mg total) by nebulization every 4 (four) hours as needed for shortness of breath. 500 mL 1  . ALPRAZolam (XANAX) 0.5 MG tablet Take 1 tablet (0.5 mg total) by mouth 2 (two) times daily as needed for anxiety. 30 tablet 0  . aspirin EC 81 MG tablet Take 81 mg by mouth every morning.     Marland Kitchen atorvastatin (LIPITOR) 80 MG tablet Take 1 tablet (80 mg total) by mouth every morning. [place]  tablet 0  . budesonide-formoterol (SYMBICORT) 80-4.5 MCG/ACT inhaler Take 2 puffs first thing in am and then another 2 puffs about 12 hours later. 1 Inhaler 11  . BYSTOLIC 5 MG tablet Take 5 mg by mouth daily.    Marland Kitchen dextromethorphan (DELSYM) 30 MG/5ML liquid Take by mouth as needed for cough.    . diltiazem (DILACOR XR) 180 MG 24 hr capsule Take 180 mg by mouth daily.    . divalproex (DEPAKOTE ER) 500 MG 24 hr tablet Take 1 tablet (500 mg total) by mouth at bedtime. 30 tablet  11  . esomeprazole (NEXIUM) 40 MG capsule Take 40 mg by mouth every morning.     Marland Kitchen estradiol (ESTRACE) 1 MG tablet Take 1 mg by mouth every morning.     . famotidine (PEPCID) 20 MG tablet Take 20 mg by mouth daily.    . furosemide (LASIX) 40 MG tablet Take 1 tablet (40 mg total) by mouth daily. 90 tablet 3  . gabapentin (NEURONTIN) 300 MG capsule Take 1 capsule (300 mg total) by mouth 3 (three) times daily. 90 capsule 11  . metFORMIN (GLUCOPHAGE) 500 MG tablet Take 1 tablet (500 mg total) by mouth 2 (two) times daily with a meal. 60 tablet 0  . potassium chloride SA (K-DUR,KLOR-CON) 20 MEQ tablet Take 20 mEq by mouth every morning.     Marland Kitchen Respiratory Therapy Supplies (FLUTTER) DEVI Use as directed 1 each 0  . sodium chloride (OCEAN) 0.65 % SOLN nasal spray Place 1 spray into both nostrils as needed for congestion.    . ticagrelor (BRILINTA) 90 MG TABS tablet Take 1 tablet (90 mg total) by mouth every 12 (twelve) hours. 180 tablet 3  . valsartan (DIOVAN) 320 MG tablet Take 1 tablet (320 mg total) by mouth daily. 90 tablet 3   No facility-administered medications prior to visit.      Allergies:   Amlodipine besylate; Lisinopril; and Pantoprazole sodium   Social History   Social History  . Marital status: Married    Spouse name: N/A  . Number of children: 3  . Years of education: some college   Occupational History  . Merchandiser    Social History Main Topics  . Smoking status: Former Smoker    Packs/day: 1.00    Years: 30.00    Types: Cigarettes    Quit date: 01/09/2004  . Smokeless tobacco: Never Used  . Alcohol use No  . Drug use: No  . Sexual activity: Not Asked   Other Topics Concern  . None   Social History Narrative   Pt lives with husband in [place].   Right-handed.   2 cups caffeine per day.     Family History:  The patient's family history includes Allergies in her mother; Asthma in her mother; CAD (age of onset: 11) in her father; CAD (age of onset: 33) in  her sister; Congestive Heart Failure in her sister; Diabetes in her father; Hypertension in her father.   ROS:   Please see the history of present illness.    ROS All other systems reviewed and are negative.   PHYSICAL EXAM:   VS:  BP 138/80   Pulse 64   Ht 5\' 2"  (1.575 m)   Wt 171 lb 12.8 oz (77.9 kg)   LMP  (LMP Unknown)   BMI 31.42 kg/m    GEN: Well nourished, well developed, in no acute distress  HEENT: normal  Neck: no JVD, carotid bruits, or masses Cardiac: RRR; no  murmurs, rubs, or gallops,no edema  Respiratory:  clear to auscultation bilaterally, normal work of breathing GI: soft, nontender, nondistended, + BS MS: no deformity or atrophy  Skin: warm and dry, no rash, minor bruising Neuro:  Alert and Oriented x 3, Strength and sensation are intact Psych: euthymic mood, full affect  Wt Readings from Last 3 Encounters:  05/11/16 171 lb 12.8 oz (77.9 kg)  05/08/16 168 lb (76.2 kg)  04/07/16 168 lb (76.2 kg)      Studies/Labs Reviewed:   EKG:  EKG is not ordered today.    Recent Labs: 01/20/2016: ALT 26 01/21/2016: Magnesium 2.4 02/23/2016: Pro B Natriuretic peptide (BNP) 13.0 02/27/2016: B Natriuretic Peptide 44.0 03/23/2016: BUN 16; Creatinine, Ser 1.18; Hemoglobin 10.3; Platelets 187; Potassium 3.6; Sodium 137   Lipid Panel    Component Value Date/Time   CHOL 207 (H) 01/21/2016 0746   TRIG 102 01/21/2016 0746   HDL 77 01/21/2016 0746   CHOLHDL 2.7 01/21/2016 0746   VLDL 20 01/21/2016 0746   LDLCALC 110 (H) 01/21/2016 0746    Additional studies/ records that were reviewed today include:      ASSESSMENT:    1. Coronary artery disease involving native heart without angina pectoris, unspecified vessel or lesion type   2. Chronic diastolic CHF (congestive heart failure) ([facility])   3. Pure hypercholesterolemia   4. Essential hypertension      PLAN:  In order of problems listed above:  CAD: s/p NSTEMI: cath on 01/18/2016 with severe single vessel CAD  with severe stenosis in the mid and distal RCA with successful PTCA/DES x 2 mid and distal RCA. -- Continue DAPTwith ASA and Brilinta for one year. Continue statin &BB ( on bystolic in the setting of chronic lung disease)  -- Bruising noted from DAPT. No major bleeding. Continue.  Chronic diastolic CHF: Appearseuvolemic today. Continue lasix 20mg  daily.   Chronic asthma: with recent exacerbation in the setting of acute bronchitis. Done with prednisone taper   HTN: BP much better controlled. Bystolic  HLD: LDL [id number]. Goal <70. Continue on atorva 80mg  daily  May wish to add Zetia  DM2: HgA1c 7.0. Continue Home rx.  Six-month follow-up with Nell Range, PA-12 months with me  Medication Adjustments/Labs and Tests Ordered: Current medicines are reviewed at length with the patient today.  Concerns regarding medicines are outlined above.  Medication changes, Labs and Tests ordered today are listed in the Patient Instructions below. Patient Instructions  Medication Instructions:  The current medical regimen is effective;  continue present plan and medications. * Follow-Up: Follow up in 6 months with Angelena Form, PA.  You will receive a letter in the mail 2 months before you are due.  Please call us when you receive this letter to schedule your follow up appointment.  Follow up in 1 year with Dr. Marlou Porch.  You will receive a letter in the mail 2 months before you are due.  Please call us when you receive this letter to schedule your follow up appointment.  If you need a refill on your cardiac medications before your next appointment, please call your pharmacy.  Thank you for choosing [facility]!!        Signed, Candee Furbish, MD  05/11/2016 12:04 PM    [address], [place], [place]  [address] Phone: [phone]; Fax: [phone]

## 2016-05-11 NOTE — Patient Instructions (Signed)
Medication Instructions:  The current medical regimen is effective;  continue present plan and medications. * Follow-Up: Follow up in 6 months with Angelena Form, PA.  You will receive a letter in the mail 2 months before you are due.  Please call us when you receive this letter to schedule your follow up appointment.  Follow up in 1 year with Dr. Marlou Porch.  You will receive a letter in the mail 2 months before you are due.  Please call us when you receive this letter to schedule your follow up appointment.  If you need a refill on your cardiac medications before your next appointment, please call your pharmacy.  Thank you for choosing [place] [facility]!!

## 2016-05-12 ENCOUNTER — Ambulatory Visit (HOSPITAL_COMMUNITY): Payer: Self-pay

## 2016-05-14 ENCOUNTER — Encounter: Payer: Self-pay | Admitting: Internal Medicine

## 2016-05-14 NOTE — Assessment & Plan Note (Signed)
-   FEN0 01/24/2016 = 11 with active symptoms on symb 160 2bid > changed to dulera 100  - Spirometry 02/23/2016  NO signifiant obstruction with active symptoms  - 02/23/2016  After extensive coaching HFA effectiveness =    75%  - FENO 02/23/2016  =  12   On dulera 100 2bid (unable to confirm adeherence as did not bring it - Singulair 10 mg daily added 02/23/2016 to dulera 100 2bid with 2 week sample only  (unable to verify she used it) - 04/07/2016   symb 80 x one sample given - FENO 04/07/2016  =   43 ? Really on meds ?  - 05/08/2016  After extensive coaching HFA effectiveness =    75% > continue symb 80 2bid  Very difficult to sort out true asthma from pseudoasthma as she has elements of both but clearly doing better at present on the lower dose ICS that doesn't aggravate the upper airway in this dose.  Next step is to do accurate med reconciliation.  To keep things simple, I have asked the patient to first separate medicines that are perceived as maintenance, that is to be taken daily "no matter what", from those medicines that are taken on only on an as-needed basis and I have given the patient examples of both, and then return to see our NP to generate a  detailed  medication calendar which should be followed until the next physician sees the patient and updates it.    I had an extended discussion with the patient reviewing all relevant studies completed to date and  lasting 15 to 20 minutes of a 25 minute visit    Each maintenance medication was reviewed in detail including most importantly the difference between maintenance and prns and under what circumstances the prns are to be triggered using an action plan format that is not reflected in the computer generated alphabetically organized AVS.    Please see AVS for specific instructions unique to this visit that I personally wrote and verbalized to the the pt in detail and then reviewed with pt  by my nurse highlighting any  changes in therapy  recommended at today's visit to their plan of care.

## 2016-05-14 NOTE — Assessment & Plan Note (Signed)
-   CT sinus 08/06/14 > neg  - 08/27/2014 rec keep duler 100 on hand to use for any flare  - FENO 01/24/2016  =   11 on symb 160 2bid with persistent cough > changed to dulera 100 - 01/24/2016  After extensive coaching HFA effectiveness =    75%  - Allergy profile 02/23/16  >  Eos 0.0 /  IgE  87 pos  RAST  Grass/ trees  - Spirometry 04/07/2016  No obstruction based on fev1/fvc or f/v curve  - improved 05/08/2016 on gabapentin 300 tid > no change rx

## 2016-05-15 ENCOUNTER — Ambulatory Visit (HOSPITAL_COMMUNITY): Payer: Self-pay

## 2016-05-17 ENCOUNTER — Ambulatory Visit (HOSPITAL_COMMUNITY): Payer: Self-pay

## 2016-05-19 ENCOUNTER — Ambulatory Visit (HOSPITAL_COMMUNITY): Payer: Self-pay

## 2016-05-22 ENCOUNTER — Ambulatory Visit (HOSPITAL_COMMUNITY): Payer: Self-pay

## 2016-05-24 ENCOUNTER — Ambulatory Visit (HOSPITAL_COMMUNITY): Payer: Self-pay

## 2016-05-26 ENCOUNTER — Ambulatory Visit (HOSPITAL_COMMUNITY): Payer: Self-pay

## 2016-05-27 ENCOUNTER — Other Ambulatory Visit: Payer: Self-pay | Admitting: Internal Medicine

## 2016-05-27 DIAGNOSIS — R058 Other specified cough: Secondary | ICD-10-CM

## 2016-05-27 DIAGNOSIS — R05 Cough: Secondary | ICD-10-CM

## 2016-05-29 ENCOUNTER — Ambulatory Visit (HOSPITAL_COMMUNITY): Payer: Self-pay

## 2016-05-29 NOTE — Telephone Encounter (Signed)
rx was printed, signed and faxed to the [facility] [address]

## 2016-05-31 ENCOUNTER — Ambulatory Visit (HOSPITAL_COMMUNITY): Payer: Self-pay

## 2016-06-02 ENCOUNTER — Ambulatory Visit (HOSPITAL_COMMUNITY): Payer: Self-pay

## 2016-06-05 ENCOUNTER — Encounter: Payer: Self-pay | Admitting: Adult Health

## 2016-06-05 ENCOUNTER — Ambulatory Visit (HOSPITAL_COMMUNITY): Payer: Self-pay

## 2016-06-05 ENCOUNTER — Ambulatory Visit (INDEPENDENT_AMBULATORY_CARE_PROVIDER_SITE_OTHER): Payer: Medicare Other | Admitting: Adult Health

## 2016-06-05 DIAGNOSIS — J301 Allergic rhinitis due to pollen: Secondary | ICD-10-CM | POA: Diagnosis not present

## 2016-06-05 DIAGNOSIS — J4530 Mild persistent asthma, uncomplicated: Secondary | ICD-10-CM | POA: Diagnosis not present

## 2016-06-05 MED ORDER — ESOMEPRAZOLE MAGNESIUM 40 MG PO CPDR: 40.0000 mg | DELAYED_RELEASE_CAPSULE | ORAL | 5 refills | Status: DC

## 2016-06-05 MED ORDER — MOMETASONE FURO-FORMOTEROL FUM 200-5 MCG/ACT IN AERO: 2.0000 | INHALATION_SPRAY | Freq: Two times a day (BID) | RESPIRATORY_TRACT | 5 refills | Status: DC

## 2016-06-05 NOTE — Addendum Note (Signed)
Addended by: Collier Salina on: 06/05/2016 09:54 AM   Modules accepted: Orders

## 2016-06-05 NOTE — Progress Notes (Signed)
Chart and office note reviewed in detail  > agree with a/p as outlined    

## 2016-06-05 NOTE — Assessment & Plan Note (Signed)
Continue on current regimen .   

## 2016-06-05 NOTE — Progress Notes (Signed)
@Patient  ID: [name], female    DOB: 1951-02-21, 66 y.o.   MRN: [id number]  Chief Complaint  Patient presents with  . Follow-up    Asthma     Referring provider: [name], MD  HPI: [id number] former smoker followed for Asthma   TEST  FEN0 01/24/2016 = 13 with active symptoms on symb 160 2bid > changed to dulera 100  - Spirometry 02/23/2016  NO signifiant obstruction with active symptoms  - FENO 02/23/2016  =  12   On dulera 100 2bid (unable to confirm adeherence as did not bring it - Singulair 10 mg daily added 02/23/2016 to dulera 100 2bid with 2 week sample only  (unable to verify she used it) - FENO 04/07/2016  =   43  06/05/2016 Follow up; Mild persistent Asthma  Pt returns for 2 month follow up . Overall says her breathing is doing much better . No flare of cough or wheezing . Does get winded with [prolonged walking.  Does have an occasional throat clearing or wheeze first thing in am but this is better.   We reviewed all her meds and organized them into a med calendar w/ pt education.  Insurance is covering inhalers now. She is taking Dulera samples now. Will need rx sent to pharm. Appears to be taking correctly .     Allergies  Allergen Reactions  . Amlodipine Besylate Other (See Comments)    Reaction unknown  . Lisinopril Other (See Comments)    Reaction unknown  . Pantoprazole Sodium Other (See Comments)    Reaction unknown    Immunization History  Administered Date(s) Administered  . Influenza Split 03/10/2012, 03/14/2015  . Influenza-Unspecified 01/11/2016    Past Medical History:  Diagnosis Date  . Anxiety   . Asthma   . Bronchitis   . Chronic diastolic CHF (congestive heart failure) ([place])   . Coronary artery disease    a. NSTEMI 12/2015 - [facility] 01/18/16: s/p overlapping DESx2 to RCA, 10% ost-prox Cx,10% mLAD.  Marland Kitchen Depression   . Diabetes mellitus   . Dyslipidemia   . GERD (gastroesophageal reflux disease)   . Heart attack   . Hypertension     . Hypertensive heart disease   . NSTEMI (non-ST elevated myocardial infarction) ([place]) 12/2015  . Pneumonia     Tobacco History: History  Smoking Status  . Former Smoker  . Packs/day: 1.00  . Years: 30.00  . Types: Cigarettes  . Quit date: 01/09/2004  Smokeless Tobacco  . Never Used   Counseling given: Not Answered   Outpatient Encounter Prescriptions as of 06/05/2016  Medication Sig  . acetaminophen-codeine (TYLENOL #3) 300-30 MG tablet TAKE ONE TABLET BY MOUTH EVERY 4 HOURS AS NEEDED FOR COUGH  . albuterol (PROVENTIL HFA;VENTOLIN HFA) 108 (90 Base) MCG/ACT inhaler Inhale 2 puffs into the lungs every 6 (six) hours as needed for wheezing or shortness of breath.  Marland Kitchen albuterol (PROVENTIL) (2.5 MG/3ML) 0.083% nebulizer solution Take 3 mLs (2.5 mg total) by nebulization every 4 (four) hours as needed for shortness of breath.  . ALPRAZolam (XANAX) 0.5 MG tablet Take 1 tablet (0.5 mg total) by mouth 2 (two) times daily as needed for anxiety.  Marland Kitchen aspirin EC 81 MG tablet Take 81 mg by mouth every morning.   Marland Kitchen atorvastatin (LIPITOR) 80 MG tablet Take 1 tablet (80 mg total) by mouth every morning.  . budesonide-formoterol (SYMBICORT) 80-4.5 MCG/ACT inhaler Take 2 puffs first thing in am and then another 2  puffs about 12 hours later.  Marland Kitchen BYSTOLIC 5 MG tablet Take 5 mg by mouth daily.  Marland Kitchen dextromethorphan (DELSYM) 30 MG/5ML liquid Take by mouth as needed for cough.  . diltiazem (DILACOR XR) 180 MG 24 hr capsule Take 180 mg by mouth daily.  . divalproex (DEPAKOTE ER) 500 MG 24 hr tablet Take 1 tablet (500 mg total) by mouth at bedtime.  Marland Kitchen esomeprazole (NEXIUM) 40 MG capsule Take 40 mg by mouth every morning.   Marland Kitchen estradiol (ESTRACE) 1 MG tablet Take 1 mg by mouth every morning.   . famotidine (PEPCID) 20 MG tablet Take 20 mg by mouth daily.  . furosemide (LASIX) 40 MG tablet Take 1 tablet (40 mg total) by mouth daily.  Marland Kitchen gabapentin (NEURONTIN) 300 MG capsule Take 1 capsule (300 mg total) by mouth 3  (three) times daily.  . metFORMIN (GLUCOPHAGE) 500 MG tablet Take 1 tablet (500 mg total) by mouth 2 (two) times daily with a meal.  . potassium chloride SA (K-DUR,KLOR-CON) 20 MEQ tablet Take 20 mEq by mouth every morning.   Marland Kitchen Respiratory Therapy Supplies (FLUTTER) DEVI Use as directed  . sodium chloride (OCEAN) 0.65 % SOLN nasal spray Place 1 spray into both nostrils as needed for congestion.  . ticagrelor (BRILINTA) 90 MG TABS tablet Take 1 tablet (90 mg total) by mouth every 12 (twelve) hours.  . valsartan (DIOVAN) 320 MG tablet Take 1 tablet (320 mg total) by mouth daily.  . mometasone-formoterol (DULERA) 200-5 MCG/ACT AERO Inhale 2 puffs into the lungs 2 (two) times daily.   No facility-administered encounter medications on file as of 06/05/2016.      Review of Systems  Constitutional:   No  weight loss, night sweats,  Fevers, chills, fatigue, or  Lassitude.  HEENT:   No headaches,  Difficulty swallowing,  Tooth/dental problems, or  Sore throat,                No sneezing, itching, ear ache,  +nasal congestion, post nasal drip,   CV:  No chest pain,  Orthopnea, PND, swelling in lower extremities, anasarca, dizziness, palpitations, syncope.   GI  No heartburn, indigestion, abdominal pain, nausea, vomiting, diarrhea, change in bowel habits, loss of appetite, bloody stools.   Resp: No shortness of breath with exertion or at rest.  No excess mucus, no productive cough,  No non-productive cough,  No coughing up of blood.  No change in color of mucus.  No wheezing.  No chest wall deformity  Skin: no rash or lesions.  GU: no dysuria, change in color of urine, no urgency or frequency.  No flank pain, no hematuria   MS:  No joint pain or swelling.  No decreased range of motion.  No back pain.    Physical Exam  BP 140/74 (BP Location: Right Arm, Cuff Size: Normal)   Pulse 75   LMP  (LMP Unknown)   SpO2 100%   GEN: A/Ox3; pleasant , NAD, well nourished    HEENT:  [place]/AT,   EACs-clear, TMs-wnl, NOSE-clear, THROAT-clear, no lesions, no postnasal drip or exudate noted.   NECK:  Supple w/ fair ROM; no JVD; normal carotid impulses w/o bruits; no thyromegaly or nodules palpated; no lymphadenopathy.    RESP  Clear  P & A; w/o, wheezes/ rales/ or rhonchi. no accessory muscle use, no dullness to percussion  CARD:  RRR, no m/r/g, no peripheral edema, pulses intact, no cyanosis or clubbing.  GI:   Soft & nt; nml bowel  sounds; no organomegaly or masses detected.   Musco: Warm bil, no deformities or joint swelling noted.   Neuro: alert, no focal deficits noted.    Skin: Warm, no lesions or rashes  Psych:  No change in mood or affect. No depression or anxiety.  No memory loss.  Lab Results:  Imaging: No results found.   Assessment & Plan:   Mild persistent chronic asthma without complication Doing well on current regimen  Patient's medications were reviewed today and patient education was given. Computerized medication calendar was adjusted/completed   Plan  Patient Instructions  Decrease Gabapentin 300mg  At bedtime  . Follow med calendar closely and bring to each visit  Follow up Dr. Melvyn Novas  In 6-8 weeks and As needed   Please contact office for sooner follow up if symptoms do not improve or worsen or seek emergency care      Allergic rhinitis Continue on current regimen       Aki Abalos, NP 06/05/2016

## 2016-06-05 NOTE — Patient Instructions (Signed)
Decrease Gabapentin 300mg  At bedtime  . Follow med calendar closely and bring to each visit  Follow up Dr. Melvyn Novas  In 6-8 weeks and As needed   Please contact office for sooner follow up if symptoms do not improve or worsen or seek emergency care

## 2016-06-05 NOTE — Addendum Note (Signed)
Addended by: Parke Poisson E on: 06/05/2016 10:34 AM   Modules accepted: Orders

## 2016-06-05 NOTE — Assessment & Plan Note (Signed)
Doing well on current regimen  Patient's medications were reviewed today and patient education was given. Computerized medication calendar was adjusted/completed   Plan  Patient Instructions  Decrease Gabapentin 300mg  At bedtime  . Follow med calendar closely and bring to each visit  Follow up Dr. Melvyn Novas  In 6-8 weeks and As needed   Please contact office for sooner follow up if symptoms do not improve or worsen or seek emergency care

## 2016-06-06 NOTE — Addendum Note (Signed)
Addended by: Parke Poisson E on: 06/06/2016 09:46 AM   Modules accepted: Orders

## 2016-06-07 ENCOUNTER — Ambulatory Visit (HOSPITAL_COMMUNITY): Payer: Self-pay

## 2016-06-09 ENCOUNTER — Ambulatory Visit (HOSPITAL_COMMUNITY): Payer: Self-pay

## 2016-06-12 ENCOUNTER — Ambulatory Visit (HOSPITAL_COMMUNITY): Payer: Self-pay

## 2016-06-14 ENCOUNTER — Ambulatory Visit (HOSPITAL_COMMUNITY): Payer: Self-pay

## 2016-06-16 ENCOUNTER — Ambulatory Visit (HOSPITAL_COMMUNITY): Payer: Self-pay

## 2016-06-19 ENCOUNTER — Ambulatory Visit (HOSPITAL_COMMUNITY): Payer: Self-pay

## 2016-06-21 ENCOUNTER — Ambulatory Visit (HOSPITAL_COMMUNITY): Payer: Self-pay

## 2016-06-22 ENCOUNTER — Ambulatory Visit: Payer: Self-pay | Admitting: Internal Medicine

## 2016-06-23 ENCOUNTER — Ambulatory Visit (HOSPITAL_COMMUNITY): Payer: Self-pay

## 2016-06-26 ENCOUNTER — Ambulatory Visit (HOSPITAL_COMMUNITY): Payer: Self-pay

## 2016-06-28 ENCOUNTER — Ambulatory Visit (HOSPITAL_COMMUNITY): Payer: Self-pay

## 2016-06-29 ENCOUNTER — Telehealth: Payer: Self-pay | Admitting: Neurology

## 2016-06-29 NOTE — Telephone Encounter (Signed)
Patient calling to find out when she can start driving.

## 2016-06-29 NOTE — Telephone Encounter (Signed)
Spoke to patient - she understands not to drive until she has been episode free for six months.

## 2016-06-30 ENCOUNTER — Ambulatory Visit (HOSPITAL_COMMUNITY): Payer: Self-pay

## 2016-07-01 ENCOUNTER — Other Ambulatory Visit: Payer: Self-pay | Admitting: Internal Medicine

## 2016-07-01 DIAGNOSIS — R058 Other specified cough: Secondary | ICD-10-CM

## 2016-07-01 DIAGNOSIS — R05 Cough: Secondary | ICD-10-CM

## 2016-07-03 ENCOUNTER — Ambulatory Visit (HOSPITAL_COMMUNITY): Payer: Self-pay

## 2016-07-03 ENCOUNTER — Telehealth: Payer: Self-pay | Admitting: Internal Medicine

## 2016-07-03 NOTE — Telephone Encounter (Signed)
rx printed, signed and faxed to [facility]

## 2016-07-03 NOTE — Telephone Encounter (Signed)
Pt aware of rec's per MW Pt will call in a few days if not better to schedule an appt with all meds in hand.  Nothing further needed.

## 2016-07-03 NOTE — Telephone Encounter (Signed)
Spoke with pt, who c/o non prod cough, mild wheezing, head congestion & voice hoarseness x3d Denies fever, chills or sweats. Pt taken delsym & flonase with mild relief.  MW please advise. Thanks.  05/08/2016 No change in medications  Work on inhaler technique:  relax and gently blow all the way out then take a nice smooth deep breath back in, triggering the inhaler at same time you start breathing in.  Hold for up to 5 seconds if you can. Blow out thru nose. Rinse and gargle with water when done    See [name] NP w/in 4 weeks with all your medications, even over the counter meds, separated in two separate bags, the ones you take no matter what vs the ones you stop once you feel better and take only as needed when you feel you need them.   [name]  will generate for you a new user friendly medication calendar that will put [place] all on the same page re: your medication use.     Without this process, it simply isn't possible to assure that we are providing  your outpatient care  with  the attention to detail we feel you deserve.   If we cannot assure that you're getting that kind of care,  then we cannot manage your problem effectively from this clinic.  Once you have seen [name] and we are sure that we're all on the same page with your medication use she will arrange follow up with me.

## 2016-07-03 NOTE — Telephone Encounter (Signed)
We refilled the tylenol #3 so take it per med calendar action plan (at the bottom) and if not doing better in a few days needs ov with all meds and med calendar

## 2016-07-05 ENCOUNTER — Ambulatory Visit (HOSPITAL_COMMUNITY): Payer: Self-pay

## 2016-07-07 ENCOUNTER — Ambulatory Visit (HOSPITAL_COMMUNITY): Payer: Self-pay

## 2016-07-17 ENCOUNTER — Emergency Department (HOSPITAL_COMMUNITY): Payer: Medicare Other

## 2016-07-17 ENCOUNTER — Encounter: Payer: Self-pay | Admitting: Internal Medicine

## 2016-07-17 ENCOUNTER — Telehealth: Payer: Self-pay | Admitting: Internal Medicine

## 2016-07-17 ENCOUNTER — Encounter (HOSPITAL_COMMUNITY): Payer: Self-pay | Admitting: Emergency Medicine

## 2016-07-17 ENCOUNTER — Ambulatory Visit (INDEPENDENT_AMBULATORY_CARE_PROVIDER_SITE_OTHER): Payer: Medicare Other | Admitting: Internal Medicine

## 2016-07-17 ENCOUNTER — Inpatient Hospital Stay (HOSPITAL_COMMUNITY)
Admission: EM | Admit: 2016-07-17 | Discharge: 2016-07-21 | DRG: 193 | Disposition: A | Discharge status: Home / Self Care | Payer: Medicare Other | Attending: Internal Medicine | Admitting: Internal Medicine

## 2016-07-17 VITALS — BP 118/70 | HR 75 | Temp 98.4°F | Ht 62.5 in | Wt 162.8 lb

## 2016-07-17 DIAGNOSIS — I252 Old myocardial infarction: Secondary | ICD-10-CM | POA: Diagnosis not present

## 2016-07-17 DIAGNOSIS — E860 Dehydration: Secondary | ICD-10-CM | POA: Diagnosis present

## 2016-07-17 DIAGNOSIS — D631 Anemia in chronic kidney disease: Secondary | ICD-10-CM | POA: Diagnosis present

## 2016-07-17 DIAGNOSIS — J4530 Mild persistent asthma, uncomplicated: Secondary | ICD-10-CM

## 2016-07-17 DIAGNOSIS — E785 Hyperlipidemia, unspecified: Secondary | ICD-10-CM

## 2016-07-17 DIAGNOSIS — K219 Gastro-esophageal reflux disease without esophagitis: Secondary | ICD-10-CM | POA: Diagnosis present

## 2016-07-17 DIAGNOSIS — J4531 Mild persistent asthma with (acute) exacerbation: Secondary | ICD-10-CM

## 2016-07-17 DIAGNOSIS — R05 Cough: Secondary | ICD-10-CM | POA: Diagnosis not present

## 2016-07-17 DIAGNOSIS — J4541 Moderate persistent asthma with (acute) exacerbation: Secondary | ICD-10-CM | POA: Diagnosis not present

## 2016-07-17 DIAGNOSIS — Z7984 Long term (current) use of oral hypoglycemic drugs: Secondary | ICD-10-CM

## 2016-07-17 DIAGNOSIS — N183 Chronic kidney disease, stage 3 (moderate): Secondary | ICD-10-CM | POA: Diagnosis not present

## 2016-07-17 DIAGNOSIS — Z86711 Personal history of pulmonary embolism: Secondary | ICD-10-CM

## 2016-07-17 DIAGNOSIS — J9600 Acute respiratory failure, unspecified whether with hypoxia or hypercapnia: Secondary | ICD-10-CM | POA: Diagnosis present

## 2016-07-17 DIAGNOSIS — I2510 Atherosclerotic heart disease of native coronary artery without angina pectoris: Secondary | ICD-10-CM | POA: Diagnosis not present

## 2016-07-17 DIAGNOSIS — Z955 Presence of coronary angioplasty implant and graft: Secondary | ICD-10-CM | POA: Diagnosis not present

## 2016-07-17 DIAGNOSIS — E1140 Type 2 diabetes mellitus with diabetic neuropathy, unspecified: Secondary | ICD-10-CM | POA: Diagnosis not present

## 2016-07-17 DIAGNOSIS — IMO0002 Reserved for concepts with insufficient information to code with codable children: Secondary | ICD-10-CM | POA: Diagnosis present

## 2016-07-17 DIAGNOSIS — I10 Essential (primary) hypertension: Secondary | ICD-10-CM | POA: Diagnosis present

## 2016-07-17 DIAGNOSIS — Z87891 Personal history of nicotine dependence: Secondary | ICD-10-CM

## 2016-07-17 DIAGNOSIS — R058 Other specified cough: Secondary | ICD-10-CM

## 2016-07-17 DIAGNOSIS — R079 Chest pain, unspecified: Secondary | ICD-10-CM | POA: Diagnosis present

## 2016-07-17 DIAGNOSIS — Z7982 Long term (current) use of aspirin: Secondary | ICD-10-CM | POA: Diagnosis not present

## 2016-07-17 DIAGNOSIS — I5032 Chronic diastolic (congestive) heart failure: Secondary | ICD-10-CM | POA: Diagnosis not present

## 2016-07-17 DIAGNOSIS — Z86718 Personal history of other venous thrombosis and embolism: Secondary | ICD-10-CM

## 2016-07-17 DIAGNOSIS — F418 Other specified anxiety disorders: Secondary | ICD-10-CM | POA: Diagnosis not present

## 2016-07-17 DIAGNOSIS — E1122 Type 2 diabetes mellitus with diabetic chronic kidney disease: Secondary | ICD-10-CM | POA: Diagnosis present

## 2016-07-17 DIAGNOSIS — J101 Influenza due to other identified influenza virus with other respiratory manifestations: Principal | ICD-10-CM | POA: Diagnosis present

## 2016-07-17 DIAGNOSIS — N1832 Chronic kidney disease, stage 3b: Secondary | ICD-10-CM

## 2016-07-17 DIAGNOSIS — I130 Hypertensive heart and chronic kidney disease with heart failure and stage 1 through stage 4 chronic kidney disease, or unspecified chronic kidney disease: Secondary | ICD-10-CM | POA: Diagnosis present

## 2016-07-17 DIAGNOSIS — N179 Acute kidney failure, unspecified: Secondary | ICD-10-CM | POA: Diagnosis present

## 2016-07-17 DIAGNOSIS — E1165 Type 2 diabetes mellitus with hyperglycemia: Secondary | ICD-10-CM | POA: Diagnosis present

## 2016-07-17 DIAGNOSIS — J45901 Unspecified asthma with (acute) exacerbation: Secondary | ICD-10-CM | POA: Diagnosis present

## 2016-07-17 HISTORY — DX: Chronic kidney disease, stage 3b: N18.32

## 2016-07-17 HISTORY — DX: Chronic diastolic (congestive) heart failure: I50.32

## 2016-07-17 LAB — BASIC METABOLIC PANEL
Anion gap: 11 (ref 5–15)
BUN: 24 mg/dL — AB (ref 6–20)
CO2: 26 mmol/L (ref 22–32)
CREATININE: 1.62 mg/dL — AB (ref 0.44–1.00)
Calcium: 8.8 mg/dL — ABNORMAL LOW (ref 8.9–10.3)
Chloride: 99 mmol/L — ABNORMAL LOW (ref 101–111)
GFR calc Af Amer: 37 mL/min — ABNORMAL LOW (ref >60)
GFR, EST NON AFRICAN AMERICAN: 32 mL/min — AB (ref >60)
GLUCOSE: 111 mg/dL — AB (ref 65–99)
POTASSIUM: 3.6 mmol/L (ref 3.5–5.1)
Sodium: 136 mmol/L (ref 135–145)

## 2016-07-17 LAB — CBC WITH DIFFERENTIAL/PLATELET
Basophils Absolute: 0 10*3/uL (ref 0.0–0.1)
Basophils Relative: 1 %
EOS PCT: 0 %
Eosinophils Absolute: 0 10*3/uL (ref 0.0–0.7)
HCT: 33.2 % — ABNORMAL LOW (ref 36.0–46.0)
Hemoglobin: 10.6 g/dL — ABNORMAL LOW (ref 12.0–15.0)
LYMPHS ABS: 1.5 10*3/uL (ref 0.7–4.0)
LYMPHS PCT: 35 %
MCH: 30.3 pg (ref 26.0–34.0)
MCHC: 31.9 g/dL (ref 30.0–36.0)
MCV: 94.9 fL (ref 78.0–100.0)
MONOS PCT: 17 %
Monocytes Absolute: 0.7 10*3/uL (ref 0.1–1.0)
Neutro Abs: 2 10*3/uL (ref 1.7–7.7)
Neutrophils Relative %: 47 %
PLATELETS: 279 10*3/uL (ref 150–400)
RBC: 3.5 MIL/uL — AB (ref 3.87–5.11)
RDW: 13.4 % (ref 11.5–15.5)
WBC: 4.3 10*3/uL (ref 4.0–10.5)

## 2016-07-17 LAB — INFLUENZA PANEL BY PCR (TYPE A & B)
Influenza A By PCR: NEGATIVE
Influenza B By PCR: POSITIVE — AB

## 2016-07-17 LAB — GLUCOSE, CAPILLARY: Glucose-Capillary: 251 mg/dL — ABNORMAL HIGH (ref 65–99)

## 2016-07-17 LAB — TROPONIN I: Troponin I: <0.03 ng/mL (ref <0.03)

## 2016-07-17 LAB — PROTIME-INR
INR: 0.97
Prothrombin Time: 12.9 seconds (ref 11.4–15.2)

## 2016-07-17 LAB — D-DIMER, QUANTITATIVE: D-Dimer, Quant: 0.55 ug/mL-FEU — ABNORMAL HIGH (ref 0.00–0.50)

## 2016-07-17 MED ORDER — HEPARIN BOLUS VIA INFUSION
4500.0000 [IU] | Freq: Once | INTRAVENOUS | Status: AC
  Administered 2016-07-17: 4500 [IU] via INTRAVENOUS
  Filled 2016-07-17: qty 4500

## 2016-07-17 MED ORDER — ALBUTEROL SULFATE (2.5 MG/3ML) 0.083% IN NEBU
INHALATION_SOLUTION | RESPIRATORY_TRACT | Status: AC
  Administered 2016-07-17: 2.5 mg via RESPIRATORY_TRACT
  Filled 2016-07-17: qty 3

## 2016-07-17 MED ORDER — NEBIVOLOL HCL 5 MG PO TABS: 5.0000 mg | ORAL_TABLET | Freq: Every day | ORAL | Status: DC

## 2016-07-17 MED ORDER — ZOLPIDEM TARTRATE 5 MG PO TABS
5.0000 mg | ORAL_TABLET | Freq: Every evening | ORAL | Status: DC | PRN
  Administered 2016-07-17 – 2016-07-19 (×3): 5 mg via ORAL
  Filled 2016-07-17 (×3): qty 1

## 2016-07-17 MED ORDER — PREDNISONE 10 MG PO TABS: ORAL_TABLET | ORAL | 0 refills | Status: DC

## 2016-07-17 MED ORDER — ALBUTEROL (5 MG/ML) CONTINUOUS INHALATION SOLN
10.0000 mg/h | INHALATION_SOLUTION | RESPIRATORY_TRACT | Status: DC
  Administered 2016-07-17: 10 mg/h via RESPIRATORY_TRACT
  Filled 2016-07-17: qty 20

## 2016-07-17 MED ORDER — ALBUTEROL SULFATE (2.5 MG/3ML) 0.083% IN NEBU
2.5000 mg | INHALATION_SOLUTION | Freq: Once | RESPIRATORY_TRACT | Status: AC
  Administered 2016-07-17: 2.5 mg via RESPIRATORY_TRACT

## 2016-07-17 MED ORDER — ALPRAZOLAM 0.5 MG PO TABS
0.5000 mg | ORAL_TABLET | Freq: Two times a day (BID) | ORAL | Status: DC | PRN
Start: 2016-07-17 — End: 2016-07-21
  Administered 2016-07-18 (×2): 0.5 mg via ORAL
  Filled 2016-07-17 (×2): qty 1

## 2016-07-17 MED ORDER — MOMETASONE FURO-FORMOTEROL FUM 200-5 MCG/ACT IN AERO: 2.0000 | INHALATION_SPRAY | Freq: Two times a day (BID) | RESPIRATORY_TRACT | 0 refills | Status: DC

## 2016-07-17 MED ORDER — MORPHINE SULFATE (PF) 4 MG/ML IV SOLN
2.0000 mg | INTRAVENOUS | Status: DC | PRN
  Administered 2016-07-18 – 2016-07-20 (×5): 2 mg via INTRAVENOUS
  Filled 2016-07-17 (×5): qty 1

## 2016-07-17 MED ORDER — INSULIN ASPART 100 UNIT/ML ~~LOC~~ SOLN
0.0000 [IU] | Freq: Every day | SUBCUTANEOUS | Status: DC
  Administered 2016-07-17: 3 [IU] via SUBCUTANEOUS
  Administered 2016-07-18 – 2016-07-20 (×3): 2 [IU] via SUBCUTANEOUS

## 2016-07-17 MED ORDER — ALBUTEROL SULFATE (2.5 MG/3ML) 0.083% IN NEBU: 2.5000 mg | INHALATION_SOLUTION | RESPIRATORY_TRACT | Status: DC | PRN

## 2016-07-17 MED ORDER — FAMOTIDINE 20 MG PO TABS
20.0000 mg | ORAL_TABLET | Freq: Every day | ORAL | Status: DC
  Administered 2016-07-17 – 2016-07-20 (×4): 20 mg via ORAL
  Filled 2016-07-17 (×5): qty 1

## 2016-07-17 MED ORDER — DM-GUAIFENESIN ER 30-600 MG PO TB12
1.0000 | ORAL_TABLET | Freq: Two times a day (BID) | ORAL | Status: DC | PRN
  Administered 2016-07-17 – 2016-07-18 (×2): 1 via ORAL
  Filled 2016-07-17 (×2): qty 1

## 2016-07-17 MED ORDER — DIVALPROEX SODIUM ER 500 MG PO TB24
500.0000 mg | ORAL_TABLET | Freq: Every day | ORAL | Status: DC
  Administered 2016-07-17 – 2016-07-20 (×4): 500 mg via ORAL
  Filled 2016-07-17 (×4): qty 1

## 2016-07-17 MED ORDER — ASPIRIN EC 81 MG PO TBEC
81.0000 mg | DELAYED_RELEASE_TABLET | Freq: Every day | ORAL | Status: DC
  Administered 2016-07-18 – 2016-07-21 (×4): 81 mg via ORAL
  Filled 2016-07-17 (×4): qty 1

## 2016-07-17 MED ORDER — IPRATROPIUM-ALBUTEROL 0.5-2.5 (3) MG/3ML IN SOLN
3.0000 mL | RESPIRATORY_TRACT | Status: AC
  Administered 2016-07-17 (×3): 3 mL via RESPIRATORY_TRACT
  Filled 2016-07-17: qty 9

## 2016-07-17 MED ORDER — DEXTROMETHORPHAN POLISTIREX ER 30 MG/5ML PO SUER: 90.0000 mg | Freq: Two times a day (BID) | ORAL | Status: DC | PRN

## 2016-07-17 MED ORDER — NITROGLYCERIN 0.4 MG SL SUBL: 0.4000 mg | SUBLINGUAL_TABLET | SUBLINGUAL | Status: DC | PRN

## 2016-07-17 MED ORDER — SALINE SPRAY 0.65 % NA SOLN: 1.0000 | Freq: Every day | NASAL | Status: DC | PRN

## 2016-07-17 MED ORDER — DILTIAZEM HCL ER COATED BEADS 180 MG PO CP24
180.0000 mg | ORAL_CAPSULE | Freq: Every day | ORAL | Status: DC
  Administered 2016-07-18 – 2016-07-21 (×4): 180 mg via ORAL
  Filled 2016-07-17 (×5): qty 1

## 2016-07-17 MED ORDER — ALBUTEROL SULFATE (2.5 MG/3ML) 0.083% IN NEBU
2.5000 mg | INHALATION_SOLUTION | Freq: Once | RESPIRATORY_TRACT | Status: DC
  Administered 2016-07-18: 2.5 mg via RESPIRATORY_TRACT

## 2016-07-17 MED ORDER — AZITHROMYCIN 250 MG PO TABS: ORAL_TABLET | ORAL | 0 refills | Status: DC

## 2016-07-17 MED ORDER — MAGNESIUM SULFATE 2 GM/50ML IV SOLN
2.0000 g | Freq: Once | INTRAVENOUS | Status: AC
  Administered 2016-07-17: 2 g via INTRAVENOUS
  Filled 2016-07-17: qty 50

## 2016-07-17 MED ORDER — HYDRALAZINE HCL 20 MG/ML IJ SOLN
5.0000 mg | INTRAMUSCULAR | Status: DC | PRN
  Filled 2016-07-17 (×2): qty 1

## 2016-07-17 MED ORDER — AZITHROMYCIN 250 MG PO TABS
500.0000 mg | ORAL_TABLET | Freq: Every day | ORAL | Status: AC
  Administered 2016-07-17: 500 mg via ORAL
  Filled 2016-07-17: qty 2

## 2016-07-17 MED ORDER — ACETAMINOPHEN 325 MG PO TABS
650.0000 mg | ORAL_TABLET | Freq: Four times a day (QID) | ORAL | Status: DC | PRN
  Administered 2016-07-17: 650 mg via ORAL
  Filled 2016-07-17: qty 2

## 2016-07-17 MED ORDER — ONDANSETRON HCL 4 MG/2ML IJ SOLN: 4.0000 mg | Freq: Three times a day (TID) | INTRAMUSCULAR | Status: DC | PRN

## 2016-07-17 MED ORDER — FLUTICASONE PROPIONATE 50 MCG/ACT NA SUSP: 2.0000 | Freq: Every day | NASAL | Status: DC | PRN

## 2016-07-17 MED ORDER — MONTELUKAST SODIUM 10 MG PO TABS
10.0000 mg | ORAL_TABLET | Freq: Every day | ORAL | Status: DC
  Administered 2016-07-17 – 2016-07-20 (×4): 10 mg via ORAL
  Filled 2016-07-17 (×4): qty 1

## 2016-07-17 MED ORDER — FUROSEMIDE 20 MG PO TABS: 40.0000 mg | ORAL_TABLET | Freq: Every day | ORAL | Status: DC

## 2016-07-17 MED ORDER — IPRATROPIUM-ALBUTEROL 0.5-2.5 (3) MG/3ML IN SOLN
3.0000 mL | RESPIRATORY_TRACT | Status: DC
  Administered 2016-07-17: 3 mL via RESPIRATORY_TRACT
  Filled 2016-07-17: qty 3

## 2016-07-17 MED ORDER — METHYLPREDNISOLONE SODIUM SUCC 125 MG IJ SOLR
125.0000 mg | Freq: Three times a day (TID) | INTRAMUSCULAR | Status: DC
  Administered 2016-07-18 (×3): 125 mg via INTRAVENOUS
  Filled 2016-07-17 (×3): qty 2

## 2016-07-17 MED ORDER — ENOXAPARIN SODIUM 40 MG/0.4ML ~~LOC~~ SOLN
40.0000 mg | SUBCUTANEOUS | Status: DC
Start: 2016-07-17 — End: 2016-07-17

## 2016-07-17 MED ORDER — DM-GUAIFENESIN ER 30-600 MG PO TB12: 1.0000 | ORAL_TABLET | Freq: Two times a day (BID) | ORAL | Status: DC | PRN

## 2016-07-17 MED ORDER — TICAGRELOR 90 MG PO TABS
90.0000 mg | ORAL_TABLET | Freq: Two times a day (BID) | ORAL | Status: DC
  Administered 2016-07-17 – 2016-07-21 (×8): 90 mg via ORAL
  Filled 2016-07-17 (×8): qty 1

## 2016-07-17 MED ORDER — ESTRADIOL 1 MG PO TABS
1.0000 mg | ORAL_TABLET | Freq: Every day | ORAL | Status: DC
  Administered 2016-07-18 – 2016-07-21 (×4): 1 mg via ORAL
  Filled 2016-07-17 (×5): qty 1

## 2016-07-17 MED ORDER — HEPARIN (PORCINE) IN NACL 100-0.45 UNIT/ML-% IJ SOLN
1100.0000 [IU]/h | INTRAMUSCULAR | Status: DC
  Administered 2016-07-17: 1100 [IU]/h via INTRAVENOUS
  Filled 2016-07-17: qty 250

## 2016-07-17 MED ORDER — INSULIN ASPART 100 UNIT/ML ~~LOC~~ SOLN
0.0000 [IU] | Freq: Three times a day (TID) | SUBCUTANEOUS | Status: DC
  Administered 2016-07-18: 3 [IU] via SUBCUTANEOUS
  Administered 2016-07-18 (×2): 2 [IU] via SUBCUTANEOUS
  Administered 2016-07-19 (×3): 3 [IU] via SUBCUTANEOUS
  Administered 2016-07-20: 5 [IU] via SUBCUTANEOUS
  Administered 2016-07-20: 2 [IU] via SUBCUTANEOUS
  Administered 2016-07-20 – 2016-07-21 (×3): 3 [IU] via SUBCUTANEOUS

## 2016-07-17 MED ORDER — AZITHROMYCIN 250 MG PO TABS: 250.0000 mg | ORAL_TABLET | Freq: Every day | ORAL | Status: DC

## 2016-07-17 MED ORDER — METHYLPREDNISOLONE SODIUM SUCC 125 MG IJ SOLR
125.0000 mg | Freq: Once | INTRAMUSCULAR | Status: AC
  Administered 2016-07-17: 125 mg via INTRAVENOUS
  Filled 2016-07-17: qty 2

## 2016-07-17 MED ORDER — GABAPENTIN 300 MG PO CAPS
300.0000 mg | ORAL_CAPSULE | Freq: Every day | ORAL | Status: DC
  Administered 2016-07-17 – 2016-07-20 (×4): 300 mg via ORAL
  Filled 2016-07-17 (×4): qty 1

## 2016-07-17 MED ORDER — ACETAMINOPHEN-CODEINE #3 300-30 MG PO TABS: ORAL_TABLET | ORAL | 0 refills | Status: DC

## 2016-07-17 NOTE — ED Provider Notes (Signed)
[facility] DEPT Provider Note   CSN: [id number] Arrival date & time: 07/17/16  1714     History   Chief Complaint Chief Complaint  Patient presents with  . Shortness of Breath    HPI [name] is a 66 y.o. female.  66 yo F with a chief complaint of shortness of breath. Feels like the patient's asthma has been going on for the past couple days. Patient has done her breathing treatments at home without improvement. Denies sick contacts. She is having some diffuse pain from her head to her toe. Denies fevers at home. Denies lower extremity edema. Denies history of PE or DVT.   The history is provided by the patient.  Shortness of Breath  This is a new problem. The average episode lasts 2 days. The problem occurs continuously.The current episode started 2 days ago. The problem has been gradually worsening. Associated symptoms include cough and wheezing. Pertinent negatives include no fever, no headaches, no rhinorrhea, no chest pain and no vomiting. She has tried nothing for the symptoms. The treatment provided no relief. Associated medical issues include asthma.    Past Medical History:  Diagnosis Date  . Anxiety   . Asthma   . Bronchitis   . Chronic diastolic CHF (congestive heart failure) ([place])   . Chronic diastolic CHF (congestive heart failure) ([place]) 07/17/2016  . Coronary artery disease    a. NSTEMI 12/2015 - LHC 01/18/16: s/p overlapping DESx2 to RCA, 10% ost-prox Cx,10% mLAD.  Marland Kitchen Depression   . Diabetes mellitus   . Dyslipidemia   . GERD (gastroesophageal reflux disease)   . Heart attack   . Hypertension   . Hypertensive heart disease   . NSTEMI (non-ST elevated myocardial infarction) ([place]) 12/2015  . Pneumonia     Patient Active Problem List   Diagnosis Date Noted  . Chronic diastolic CHF (congestive heart failure) ([place]) 07/17/2016  . Acute renal failure superimposed on stage 3 chronic kidney disease ([place]) 07/17/2016  . Right leg swelling 02/27/2016  .  Acute respiratory failure with hypoxia ([place])   . Uncontrolled type 2 diabetes mellitus with complication ([place])   . Acute pulmonary embolism ([place])   . HLD (hyperlipidemia)   . Seizures ([place]) 01/27/2016  . Numbness 01/27/2016  . CAD in native artery 01/20/2016  . Hypertensive heart disease 01/20/2016  . NSTEMI (non-ST elevated myocardial infarction) ([place]) 01/18/2016  . Acute bronchitis with asthma with acute exacerbation 01/16/2016  . Acute hypokalemia 01/16/2016  . Hypomagnesemia 01/16/2016  . Acute respiratory failure ([place]) 01/16/2016  . Dyspnea   . Chest pain   . Asthma exacerbation 07/21/2015  . Upper airway cough syndrome vs Asthma  08/28/2014  . Diabetes mellitus type 2, controlled ([place]) 08/06/2014  . Hyperlipidemia 08/06/2014  . Allergic rhinitis 07/22/2008  . 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 DYSPNEA 07/22/2008  . Diabetes mellitus type 2, uncontrolled ([place]) 07/21/2008  . DIABETIC PERIPHERAL NEUROPATHY 07/21/2008  . Depression with anxiety 07/21/2008  . Essential hypertension 07/21/2008  . Mild persistent chronic asthma without complication 95/62/1308  . G E R D 07/21/2008  . PULMONARY EMBOLISM, HX OF 07/21/2008    Past Surgical History:  Procedure Laterality Date  . ABDOMINAL HYSTERECTOMY    . CARDIAC CATHETERIZATION  1994   minimal LAD dz, no other dz, EF normal  . CARDIAC CATHETERIZATION N/A 01/18/2016   Procedure: Left Heart Cath and Coronary Angiography;  Surgeon: Burnell Blanks, MD;  Location: [facility];  Service: Cardiovascular;  Laterality: N/A;  . CARDIAC CATHETERIZATION N/A 01/18/2016   Procedure: Coronary Stent Intervention;  Surgeon: Burnell Blanks, MD;  Location: [facility];  Service: Cardiovascular;  Laterality: N/A;  . COLONOSCOPY WITH PROPOFOL N/A 04/09/2014   Procedure: COLONOSCOPY WITH PROPOFOL;  Surgeon: Cleotis Nipper, MD;  Location: [facility];  Service: Endoscopy;  Laterality: N/A;  . CORONARY STENT  PLACEMENT  01/19/2016   STENT SYNERGY DES 8.58I50 drug eluting stent was successfully placed, and overlaps the 2.5 x 38 mm Synergy stent placed distally.  . ESOPHAGOGASTRODUODENOSCOPY (EGD) WITH PROPOFOL N/A 04/09/2014   Procedure: ESOPHAGOGASTRODUODENOSCOPY (EGD) WITH PROPOFOL;  Surgeon: Cleotis Nipper, MD;  Location: [facility];  Service: Endoscopy;  Laterality: N/A;  . HEMORRHOID SURGERY    . HERNIA REPAIR    . KNEE ARTHROSCOPY      OB History    No data available       Home Medications    Prior to Admission medications   Medication Sig Start Date End Date Taking? Authorizing Provider  acetaminophen-codeine (TYLENOL #3) 300-30 MG tablet TAKE ONE TABLET BY MOUTH EVERY 4 HOURS AS NEEDED FOR COUGH Patient taking differently: Take 1 tablet by mouth every 4 (four) hours as needed (cough).  07/17/16  Yes Tanda Rockers, MD  albuterol (PROVENTIL HFA;VENTOLIN HFA) 108 (90 Base) MCG/ACT inhaler Inhale 2 puffs into the lungs every 6 (six) hours as needed for wheezing or shortness of breath. 08/12/15  Yes Magdalen Spatz, NP  albuterol (PROVENTIL) (2.5 MG/3ML) 0.083% nebulizer solution Take 3 mLs (2.5 mg total) by nebulization every 4 (four) hours as needed for shortness of breath. Patient taking differently: Take 2.5 mg by nebulization every 4 (four) hours as needed for wheezing or shortness of breath.  07/31/15  Yes Theodis Blaze, MD  ALPRAZolam Duanne Moron) 0.5 MG tablet Take 1 tablet (0.5 mg total) by mouth 2 (two) times daily as needed for anxiety. 07/31/15  Yes Theodis Blaze, MD  aspirin EC 81 MG tablet Take 81 mg by mouth daily.    Yes Historical Provider, MD  dextromethorphan (DELSYM) 30 MG/5ML liquid Take 90 mg by mouth 2 (two) times daily as needed for cough.    Yes Historical Provider, MD  diltiazem (DILACOR XR) 180 MG 24 hr capsule Take 180 mg by mouth daily. 02/22/16  Yes Historical Provider, MD  divalproex (DEPAKOTE ER) 500 MG 24 hr tablet Take 1 tablet (500 mg total) by mouth at bedtime.  01/27/16  Yes Marcial Pacas, MD  esomeprazole (NEXIUM) 40 MG capsule Take 1 capsule (40 mg total) by mouth every morning. Patient taking differently: Take 40 mg by mouth daily before breakfast.  06/05/16  Yes [name] S Parrett, NP  estradiol (ESTRACE) 1 MG tablet Take 1 mg by mouth daily.    Yes Historical Provider, MD  famotidine (PEPCID) 20 MG tablet Take 20 mg by mouth at bedtime.    Yes Historical Provider, MD  fluticasone (FLONASE) 50 MCG/ACT nasal spray Place 2 sprays into both nostrils daily as needed for allergies or rhinitis.   Yes Historical Provider, MD  furosemide (LASIX) 40 MG tablet Take 1 tablet (40 mg total) by mouth daily. 04/18/16 07/17/16 Yes Eileen Stanford, PA-C  gabapentin (NEURONTIN) 300 MG capsule Take 300 mg by mouth at bedtime.   Yes Historical Provider, MD  metFORMIN (GLUCOPHAGE) 500 MG tablet Take 1 tablet (500 mg total) by mouth 2 (two) times daily with a meal. 03/02/16  Yes Geradine Girt, DO  mometasone-formoterol (DULERA) 200-5 MCG/ACT AERO Inhale 2 puffs into the lungs 2 (two) times daily. 07/17/16  Yes Tanda Rockers, MD  nebivolol (BYSTOLIC) 5 MG tablet Take 5 mg by mouth daily.   Yes Historical Provider, MD  potassium chloride SA (K-DUR,KLOR-CON) 20 MEQ tablet Take 20 mEq by mouth daily.    Yes Historical Provider, MD  sodium chloride (OCEAN) 0.65 % SOLN nasal spray Place 1 spray into both nostrils daily as needed for congestion.    Yes Historical Provider, MD  ticagrelor (BRILINTA) 90 MG TABS tablet Take 1 tablet (90 mg total) by mouth every 12 (twelve) hours. 01/26/16  Yes Eileen Stanford, PA-C  valsartan (DIOVAN) 320 MG tablet Take 1 tablet (320 mg total) by mouth daily. 01/26/16  Yes Eileen Stanford, PA-C  azithromycin (ZITHROMAX) 250 MG tablet Take 2 on day one then 1 daily x 4 days Patient taking differently: Take 250-500 mg by mouth See admin instructions. Take 2 tablets (500 mg) by mouth 1st day, then take 1 tablet (250 mg) daily on days 2-5 07/17/16   Tanda Rockers, MD  nitroGLYCERIN (NITROSTAT) 0.4 MG SL tablet Place 0.4 mg under the tongue every 5 (five) minutes as needed for chest pain. x3 as needed    Historical Provider, MD  predniSONE (DELTASONE) 10 MG tablet Take  4 each am x 2 days,   2 each am x 2 days,  1 each am x 2 days and stop Patient taking differently: Take 10-40 mg by mouth See admin instructions. Take 4 tablets (40 mg) by mouth daily for 2 days, then take 2 tablets (20 mg) daily for 2 days, then take 1 tablet (10 mg) daily for 2 days, then stop 07/17/16   Tanda Rockers, MD  Respiratory Therapy Supplies (FLUTTER) DEVI Use as directed 02/23/16   Tanda Rockers, MD    Family History Family History  Problem Relation Age of Onset  . Allergies Mother   . Asthma Mother   . CAD Father 26  . Diabetes Father   . Hypertension Father   . Congestive Heart Failure Sister     died in her 71s  . CAD Sister 80    Social History Social History  Substance Use Topics  . Smoking status: Former Smoker    Packs/day: 1.00    Years: 30.00    Types: Cigarettes    Quit date: 01/09/2004  . Smokeless tobacco: Never Used  . Alcohol use No     Allergies   Amlodipine besylate; Lisinopril; and Pantoprazole sodium   Review of Systems Review of Systems  Constitutional: Negative for chills and fever.  HENT: Negative for congestion and rhinorrhea.   Eyes: Negative for redness and visual disturbance.  Respiratory: Positive for cough, shortness of breath and wheezing.   Cardiovascular: Negative for chest pain and palpitations.  Gastrointestinal: Negative for nausea and vomiting.  Genitourinary: Negative for dysuria and urgency.  Musculoskeletal: Negative for arthralgias and myalgias.  Skin: Negative for pallor and wound.  Neurological: Negative for dizziness and headaches.     Physical Exam Updated Vital Signs BP 134/75   Pulse 81   Resp 18   LMP  (LMP Unknown)   SpO2 99%   Physical Exam  Constitutional: She is oriented to person,  place, and time. She appears well-developed and well-nourished. No distress.  HENT:  Head: Normocephalic and atraumatic.  Eyes: EOM are normal. Pupils are equal, round, and reactive to light.  Neck: Normal range of  motion. Neck supple.  Cardiovascular: Normal rate and regular rhythm.  Exam reveals no gallop and no friction rub.   No murmur heard. Pulmonary/Chest: Effort normal. She has wheezes. She has no rales.  Tachypnea, prolonged expiration  Abdominal: Soft. She exhibits no distension. There is no tenderness.  Musculoskeletal: She exhibits no edema or tenderness.  Neurological: She is alert and oriented to person, place, and time.  Skin: Skin is warm and dry. She is not diaphoretic.  Psychiatric: She has a normal mood and affect. Her behavior is normal.  Nursing note and vitals reviewed.    ED Treatments / Results  Labs (all labs ordered are listed, but only abnormal results are displayed) Labs Reviewed  CBC WITH DIFFERENTIAL/PLATELET - Abnormal; Notable for the following:       Result Value   RBC 3.50 (*)    Hemoglobin 10.6 (*)    HCT 33.2 (*)    All other components within normal limits  BASIC METABOLIC PANEL - Abnormal; Notable for the following:    Chloride 99 (*)    Glucose, Bld 111 (*)    BUN 24 (*)    Creatinine, Ser 1.62 (*)    Calcium 8.8 (*)    GFR calc non Af Amer 32 (*)    GFR calc Af Amer 37 (*)    All other components within normal limits    EKG  EKG Interpretation None       Radiology Dg Chest Portable 1 View  Result Date: 07/17/2016 CLINICAL DATA:  Shortness of breath and wheezing EXAM: PORTABLE CHEST 1 VIEW COMPARISON:  03/23/2016 FINDINGS: The heart size and mediastinal contours are within normal limits. Both lungs are clear. The visualized skeletal structures are unremarkable. IMPRESSION: No active disease. Electronically Signed   By: Inez Catalina M.D.   On: 07/17/2016 18:36    Procedures Procedures (including critical care  time)  Medications Ordered in ED Medications  albuterol (PROVENTIL,VENTOLIN) solution continuous neb (10 mg/hr Nebulization New Bag/Given 07/17/16 1915)  albuterol (PROVENTIL) (2.5 MG/3ML) 0.083% nebulizer solution 2.5 mg (2.5 mg Nebulization Given 07/17/16 1745)  ipratropium-albuterol (DUONEB) 0.5-2.5 (3) MG/3ML nebulizer solution 3 mL (3 mLs Nebulization Given 07/17/16 1834)  magnesium sulfate IVPB 2 g 50 mL (2 g Intravenous New Bag/Given 07/17/16 1836)  methylPREDNISolone sodium succinate (SOLU-MEDROL) 125 mg/2 mL injection 125 mg (125 mg Intravenous Given 07/17/16 1833)     Initial Impression / Assessment and Plan / ED Course  I have reviewed the triage vital signs and the nursing notes.  Pertinent labs & imaging results that were available during my care of the patient were reviewed by me and considered in my medical decision making (see chart for details).     66 yo F With a chief complaint of shortness of breath. Patient has a history of asthma and thinks it feels like the same. On my exam patient has prolonged expiration and diffuse wheezing. I will give 3 DuoNeb's back-to-back Solu-Medrol magnesium and reassess.  No improvement at all after 3 DuoNeb's steroids and magnesium. I'll start the patient on a continuous. Discussed case with the hospitalist. Admit.  CRITICAL CARE Performed by: Cecilio Asper   Total critical care time: 35 minutes  Critical care time was exclusive of separately billable procedures and treating other patients.  Critical care was necessary to treat or prevent imminent or life-threatening deterioration.  Critical care was time spent personally by me on the following activities: development of treatment plan with patient and/or surrogate as well  as nursing, discussions with consultants, evaluation of patient's response to treatment, examination of patient, obtaining history from patient or surrogate, ordering and performing treatments and interventions,  ordering and review of laboratory studies, ordering and review of radiographic studies, pulse oximetry and re-evaluation of patient's condition.  The patients results and plan were reviewed and discussed.   Any x-rays performed were independently reviewed by myself.   Differential diagnosis were considered with the presenting HPI.  Medications  albuterol (PROVENTIL,VENTOLIN) solution continuous neb (10 mg/hr Nebulization New Bag/Given 07/17/16 1915)  albuterol (PROVENTIL) (2.5 MG/3ML) 0.083% nebulizer solution 2.5 mg (2.5 mg Nebulization Given 07/17/16 1745)  ipratropium-albuterol (DUONEB) 0.5-2.5 (3) MG/3ML nebulizer solution 3 mL (3 mLs Nebulization Given 07/17/16 1834)  magnesium sulfate IVPB 2 g 50 mL (2 g Intravenous New Bag/Given 07/17/16 1836)  methylPREDNISolone sodium succinate (SOLU-MEDROL) 125 mg/2 mL injection 125 mg (125 mg Intravenous Given 07/17/16 1833)    Vitals:   07/17/16 1800 07/17/16 1815 07/17/16 1900 07/17/16 1920  BP: (!) 145/64 137/72 134/75   Pulse: 67 67 81   Resp:  18    SpO2: 97% 98% 94% 99%    Final diagnoses:  Mild persistent asthma with exacerbation    Admission/ observation were discussed with the admitting physician, patient and/or family and they are comfortable with the plan.   Final Clinical Impressions(s) / ED Diagnoses   Final diagnoses:  Mild persistent asthma with exacerbation    New Prescriptions New Prescriptions   No medications on file     Deno Etienne, DO 07/17/16 1940

## 2016-07-17 NOTE — Telephone Encounter (Signed)
Spoke with pt. States that since seeing MW this morning her symptoms have worsened. While speaking to the pt on the phone, she could not form a sentence she was so short of breath and wheezing so hard. I advised the pt that she needed to go to the closest ED to be evaluated. She agreed and verbalized understanding. Nothing further was needed.

## 2016-07-17 NOTE — ED Triage Notes (Signed)
Patient p;resents POV with SOB, Asthma and audible wheezes, "can't get air out".

## 2016-07-17 NOTE — ED Notes (Signed)
MD made aware of the patient's status

## 2016-07-17 NOTE — Progress Notes (Signed)
ANTICOAGULATION CONSULT NOTE - Initial Consult  Pharmacy Consult for Heparin Indication: Possible PE  Allergies  Allergen Reactions  . Amlodipine Besylate Other (See Comments)    Reaction unknown  . Lisinopril Other (See Comments)    Reaction unknown  . Pantoprazole Sodium Other (See Comments)    Reaction unknown    Patient Measurements:   Heparin Dosing Weight: 67kg  Vital Signs: BP: 160/59 (03/19 2030) Pulse Rate: 86 (03/19 2115)  Labs:  Recent Labs  07/17/16 1828 07/17/16 2042  HGB 10.6*  --   HCT 33.2*  --   PLT 279  --   CREATININE 1.62*  --   TROPONINI  --  <0.03    Estimated Creatinine Clearance: 32.1 mL/min (A) (by C-G formula based on SCr of 1.62 mg/dL (H)).   Medical History: Past Medical History:  Diagnosis Date  . Anxiety   . Asthma   . Bronchitis   . Chronic diastolic CHF (congestive heart failure) ([place])   . Chronic diastolic CHF (congestive heart failure) ([name]) 07/17/2016  . Coronary artery disease    a. NSTEMI 12/2015 - LHC 01/18/16: s/p overlapping DESx2 to RCA, 10% ost-prox Cx,10% mLAD.  Marland Kitchen Depression   . Diabetes mellitus   . Dyslipidemia   . GERD (gastroesophageal reflux disease)   . Heart attack   . Hypertension   . Hypertensive heart disease   . NSTEMI (non-ST elevated myocardial infarction) ([place]) 12/2015  . Pneumonia     Medications:  Facility-Administered Medications Prior to Admission  Medication Dose Route Frequency Provider Last Rate Last Dose  . albuterol (PROVENTIL) (2.5 MG/3ML) 0.083% nebulizer solution 2.5 mg  2.5 mg Nebulization Once Tanda Rockers, MD       Prescriptions Prior to Admission  Medication Sig Dispense Refill Last Dose  . acetaminophen-codeine (TYLENOL #3) 300-30 MG tablet TAKE ONE TABLET BY MOUTH EVERY 4 HOURS AS NEEDED FOR COUGH (Patient taking differently: Take 1 tablet by mouth every 4 (four) hours as needed (cough). ) 40 tablet 0 07/17/2016 at noon  . albuterol (PROVENTIL HFA;VENTOLIN HFA) 108 (90 Base)  MCG/ACT inhaler Inhale 2 puffs into the lungs every 6 (six) hours as needed for wheezing or shortness of breath. 1 Inhaler 5 07/17/2016 at am  . albuterol (PROVENTIL) (2.5 MG/3ML) 0.083% nebulizer solution Take 3 mLs (2.5 mg total) by nebulization every 4 (four) hours as needed for shortness of breath. (Patient taking differently: Take 2.5 mg by nebulization every 4 (four) hours as needed for wheezing or shortness of breath. ) 500 mL 1 07/17/2016 at afternoon  . ALPRAZolam (XANAX) 0.5 MG tablet Take 1 tablet (0.5 mg total) by mouth 2 (two) times daily as needed for anxiety. 30 tablet 0 couple days ago  . aspirin EC 81 MG tablet Take 81 mg by mouth daily.    07/17/2016 at am  . dextromethorphan (DELSYM) 30 MG/5ML liquid Take 90 mg by mouth 2 (two) times daily as needed for cough.    07/17/2016 at am  . diltiazem (DILACOR XR) 180 MG 24 hr capsule Take 180 mg by mouth daily.   07/17/2016 at am  . divalproex (DEPAKOTE ER) 500 MG 24 hr tablet Take 1 tablet (500 mg total) by mouth at bedtime. 30 tablet 11 07/16/2016 at pm  . esomeprazole (NEXIUM) 40 MG capsule Take 1 capsule (40 mg total) by mouth every morning. (Patient taking differently: Take 40 mg by mouth daily before breakfast. ) 30 capsule 5 07/17/2016 at am  . estradiol (ESTRACE) 1 MG  tablet Take 1 mg by mouth daily.    07/17/2016 at am  . famotidine (PEPCID) 20 MG tablet Take 20 mg by mouth at bedtime.    07/16/2016 at pm  . fluticasone (FLONASE) 50 MCG/ACT nasal spray Place 2 sprays into both nostrils daily as needed for allergies or rhinitis.   07/17/2016 at am  . furosemide (LASIX) 40 MG tablet Take 1 tablet (40 mg total) by mouth daily. 90 tablet 3 07/17/2016 at am  . gabapentin (NEURONTIN) 300 MG capsule Take 300 mg by mouth at bedtime.   07/16/2016 at pm  . metFORMIN (GLUCOPHAGE) 500 MG tablet Take 1 tablet (500 mg total) by mouth 2 (two) times daily with a meal. 60 tablet 0 07/17/2016 at am  . mometasone-formoterol (DULERA) 200-5 MCG/ACT AERO Inhale 2  puffs into the lungs 2 (two) times daily. 1 Inhaler 0 07/17/2016 at am  . nebivolol (BYSTOLIC) 5 MG tablet Take 5 mg by mouth daily.   07/17/2016 at 800  . potassium chloride SA (K-DUR,KLOR-CON) 20 MEQ tablet Take 20 mEq by mouth daily.    07/17/2016 at am  . sodium chloride (OCEAN) 0.65 % SOLN nasal spray Place 1 spray into both nostrils daily as needed for congestion.    07/17/2016 at am  . ticagrelor (BRILINTA) 90 MG TABS tablet Take 1 tablet (90 mg total) by mouth every 12 (twelve) hours. 180 tablet 3 07/17/2016 at 800  . valsartan (DIOVAN) 320 MG tablet Take 1 tablet (320 mg total) by mouth daily. 90 tablet 3 07/17/2016 at am  . azithromycin (ZITHROMAX) 250 MG tablet Take 2 on day one then 1 daily x 4 days (Patient taking differently: Take 250-500 mg by mouth See admin instructions. Take 2 tablets (500 mg) by mouth 1st day, then take 1 tablet (250 mg) daily on days 2-5) 6 tablet 0 not yet taken  . nitroGLYCERIN (NITROSTAT) 0.4 MG SL tablet Place 0.4 mg under the tongue every 5 (five) minutes as needed for chest pain. x3 as needed   never taken  . predniSONE (DELTASONE) 10 MG tablet Take  4 each am x 2 days,   2 each am x 2 days,  1 each am x 2 days and stop (Patient taking differently: Take 10-40 mg by mouth See admin instructions. Take 4 tablets (40 mg) by mouth daily for 2 days, then take 2 tablets (20 mg) daily for 2 days, then take 1 tablet (10 mg) daily for 2 days, then stop) 14 tablet 0 not yet started  . Respiratory Therapy Supplies (FLUTTER) DEVI Use as directed 1 each 0 Taking   Scheduled:  . [START ON 07/18/2016] aspirin EC  81 mg Oral Daily  . [START ON 07/18/2016] azithromycin  250 mg Oral Daily  . [START ON 07/18/2016] diltiazem  180 mg Oral Daily  . divalproex  500 mg Oral QHS  . [START ON 07/18/2016] estradiol  1 mg Oral Daily  . famotidine  20 mg Oral QHS  . gabapentin  300 mg Oral QHS  . insulin aspart  0-5 Units Subcutaneous QHS  . [START ON 07/18/2016] insulin aspart  0-9 Units  Subcutaneous TID WC  . ipratropium-albuterol  3 mL Nebulization Q4H  . [START ON 07/18/2016] methylPREDNISolone (SOLU-MEDROL) injection  125 mg Intravenous TID  . montelukast  10 mg Oral QHS  . ticagrelor  90 mg Oral Q12H   Infusions:  . heparin 1,100 Units/hr (07/17/16 2223)    Assessment: 80 yof admitted 3/19 w/ possible PE. No  anticoagulation prior to admission. Pharmacy consulted to dose heparin.   Baseline CBC shows hemoglobin low at 10.6 and platelet count within normal limits. No bleeding noted.   Goal of Therapy:  Heparin level 0.3-0.7 units/ml Monitor platelets by anticoagulation protocol: Yes   Plan:  Give 4500 units bolus x 1 Start heparin infusion at 1100 units/hr Check anti-Xa level in 6 hours and daily while on heparin  F/U V/Q scan  Demetrius Charity, PharmD Acute Care Pharmacy Resident  Pager: [phone] 07/17/2016

## 2016-07-17 NOTE — Patient Instructions (Addendum)
zpak  Prednisone 10 mg take  4 each am x 2 days,   2 each am x 2 days,  1 each am x 2 days and stop   Plan A = Automatic = dulera 200 Take 2 puffs first thing in am and then another 2 puffs about 12 hours later.    Work on inhaler technique:  relax and gently blow all the way out then take a nice smooth deep breath back in, triggering the inhaler at same time you start breathing in.  Hold for up to 5 seconds if you can. Blow out thru nose. Rinse and gargle with water when done     Plan B = Backup Only use your albuterol Proair as a rescue medication to be used if you can't catch your breath by resting or doing a relaxed purse lip breathing pattern.  - The less you use it, the better it will work when you need it. - Ok to use the inhaler up to 2 puffs  every 4 hours if you must but call for appointment if use goes up over your usual need - Don't leave home without it !!  (think of it like the spare tire for your car)   Plan C = Crisis - only use your albuterol nebulizer if you first try Plan B and it fails to help > ok to use the nebulizer up to every 4 hours but if start needing it regularly call for immediate appointment   For cough >  Delsym and tylenol #3 and use the flutter valve as much as you can   See [name] NP w/in 2 weeks with all your medications, even over the counter meds, separated in two separate bags, the ones you take no matter what vs the ones you stop once you feel better and take only as needed when you feel you need them.   [name]  will generate for you a new user friendly medication calendar that will put [place] all on the same page re: your medication use.   Late add:  singulair 10 mg q pm

## 2016-07-17 NOTE — H&P (Addendum)
History and Physical    [name] YIR:[id number] DOB: 07/29/1950 DOA: 07/17/2016  Referring MD/NP/PA:   PCP: [name], MD   Patient coming from:  The patient is coming from home.  At baseline, pt is independent for most of ADL.   Chief Complaint: cough, SOB, wheezing and chest pain  HPI: [name] is a 66 y.o. female with medical history significant of asthma, hypertension, hyperlipidemia, type it is mellitus, GERD, depression, Slightly, CAD, non-STEMI (S A/P of stent placement 01/19/16), dCHF, CKD-III, PE, who presents with shortness of breath, cough, wheezing and chest pain.  Patient states that she has been having worsening shortness of breath in the past 5 days, which has been progressively getting worse. She can barely speak in full sentence. She uses extra respiratory muscle for breathing. She also has wheezing and cough. She coughs up yellow colored sputum. No fever or chills. Denies runny nose or sore throat. Patient also complains of pleuritic chest pain. It is located in the substernal area, constant, 10 out of 10 in severity, aggravated by breathing out per patient. Denies tenderness over calf areas. Patient does not have nausea, vomiting, diarrhea, abdominal pain, symptoms of UTI or unilateral weakness. Pt was seen by her pulmonologist, Dr. Melvyn Novas today, and was given prescription of Singulair, prednisone and azithromycin, but pt has not started taking these new meds yet.  ED Course: pt was found to have WBC 4.3, worsening renal function, temperature normal, no tachycardia, oxygen saturation 94% on room air, negative chest x-ray for acute abnormalities. Patient is admitted to telemetry bed as inpatient.  Review of Systems:   General: no fevers, chills, no changes in body weight, has fatigue HEENT: no blurry vision, hearing changes or sore throat Respiratory: has dyspnea, coughing, wheezing CV: has chest pain, no palpitations GI: no nausea, vomiting, abdominal pain,  diarrhea, constipation GU: no dysuria, burning on urination, increased urinary frequency, hematuria  Ext: has trace leg edema Neuro: no unilateral weakness, numbness, or tingling, no vision change or hearing loss Skin: no rash, no skin tear. MSK: No muscle spasm, no deformity, no limitation of range of movement in spin Heme: No easy bruising.  Travel history: No recent long distant travel.  Allergy:  Allergies  Allergen Reactions  . Amlodipine Besylate Other (See Comments)    Reaction unknown  . Lisinopril Other (See Comments)    Reaction unknown  . Pantoprazole Sodium Other (See Comments)    Reaction unknown    Past Medical History:  Diagnosis Date  . Anxiety   . Asthma   . Bronchitis   . Chronic diastolic CHF (congestive heart failure) ([place])   . Chronic diastolic CHF (congestive heart failure) ([place]) 07/17/2016  . Coronary artery disease    a. NSTEMI 12/2015 - LHC 01/18/16: s/p overlapping DESx2 to RCA, 10% ost-prox Cx,10% mLAD.  Marland Kitchen Depression   . Diabetes mellitus   . Dyslipidemia   . GERD (gastroesophageal reflux disease)   . Heart attack   . Hypertension   . Hypertensive heart disease   . NSTEMI (non-ST elevated myocardial infarction) ([place]) 12/2015  . Pneumonia     Past Surgical History:  Procedure Laterality Date  . ABDOMINAL HYSTERECTOMY    . CARDIAC CATHETERIZATION  1994   minimal LAD dz, no other dz, EF normal  . CARDIAC CATHETERIZATION N/A 01/18/2016   Procedure: Left Heart Cath and Coronary Angiography;  Surgeon: Burnell Blanks, MD;  Location: [facility];  Service: Cardiovascular;  Laterality: N/A;  .  CARDIAC CATHETERIZATION N/A 01/18/2016   Procedure: Coronary Stent Intervention;  Surgeon: Burnell Blanks, MD;  Location: [facility];  Service: Cardiovascular;  Laterality: N/A;  . COLONOSCOPY WITH PROPOFOL N/A 04/09/2014   Procedure: COLONOSCOPY WITH PROPOFOL;  Surgeon: Cleotis Nipper, MD;  Location: [facility];  Service:  Endoscopy;  Laterality: N/A;  . CORONARY STENT PLACEMENT  01/19/2016   STENT SYNERGY DES 6.76P95 drug eluting stent was successfully placed, and overlaps the 2.5 x 38 mm Synergy stent placed distally.  . ESOPHAGOGASTRODUODENOSCOPY (EGD) WITH PROPOFOL N/A 04/09/2014   Procedure: ESOPHAGOGASTRODUODENOSCOPY (EGD) WITH PROPOFOL;  Surgeon: Cleotis Nipper, MD;  Location: [facility];  Service: Endoscopy;  Laterality: N/A;  . HEMORRHOID SURGERY    . HERNIA REPAIR    . KNEE ARTHROSCOPY      Social History:  reports that she quit smoking about 12 years ago. Her smoking use included Cigarettes. She has a 30.00 pack-year smoking history. She has never used smokeless tobacco. She reports that she does not drink alcohol or use drugs.  Family History:  Family History  Problem Relation Age of Onset  . Allergies Mother   . Asthma Mother   . CAD Father 49  . Diabetes Father   . Hypertension Father   . Congestive Heart Failure Sister     died in her 67s  . CAD Sister 72     Prior to Admission medications   Medication Sig Start Date End Date Taking? Authorizing Provider  acetaminophen-codeine (TYLENOL #3) 300-30 MG tablet TAKE ONE TABLET BY MOUTH EVERY 4 HOURS AS NEEDED FOR COUGH Patient taking differently: Take 1 tablet by mouth every 4 (four) hours as needed (cough).  07/17/16  Yes Tanda Rockers, MD  albuterol (PROVENTIL HFA;VENTOLIN HFA) 108 (90 Base) MCG/ACT inhaler Inhale 2 puffs into the lungs every 6 (six) hours as needed for wheezing or shortness of breath. 08/12/15  Yes Magdalen Spatz, NP  albuterol (PROVENTIL) (2.5 MG/3ML) 0.083% nebulizer solution Take 3 mLs (2.5 mg total) by nebulization every 4 (four) hours as needed for shortness of breath. Patient taking differently: Take 2.5 mg by nebulization every 4 (four) hours as needed for wheezing or shortness of breath.  07/31/15  Yes Theodis Blaze, MD  ALPRAZolam Duanne Moron) 0.5 MG tablet Take 1 tablet (0.5 mg total) by mouth 2 (two) times daily as  needed for anxiety. 07/31/15  Yes Theodis Blaze, MD  aspirin EC 81 MG tablet Take 81 mg by mouth daily.    Yes Historical Provider, MD  dextromethorphan (DELSYM) 30 MG/5ML liquid Take 90 mg by mouth 2 (two) times daily as needed for cough.    Yes Historical Provider, MD  diltiazem (DILACOR XR) 180 MG 24 hr capsule Take 180 mg by mouth daily. 02/22/16  Yes Historical Provider, MD  divalproex (DEPAKOTE ER) 500 MG 24 hr tablet Take 1 tablet (500 mg total) by mouth at bedtime. 01/27/16  Yes Marcial Pacas, MD  esomeprazole (NEXIUM) 40 MG capsule Take 1 capsule (40 mg total) by mouth every morning. Patient taking differently: Take 40 mg by mouth daily before breakfast.  06/05/16  Yes [name] S Parrett, NP  estradiol (ESTRACE) 1 MG tablet Take 1 mg by mouth daily.    Yes Historical Provider, MD  famotidine (PEPCID) 20 MG tablet Take 20 mg by mouth at bedtime.    Yes Historical Provider, MD  fluticasone (FLONASE) 50 MCG/ACT nasal spray Place 2 sprays into both nostrils daily as needed for allergies  or rhinitis.   Yes Historical Provider, MD  furosemide (LASIX) 40 MG tablet Take 1 tablet (40 mg total) by mouth daily. 04/18/16 07/17/16 Yes Eileen Stanford, PA-C  gabapentin (NEURONTIN) 300 MG capsule Take 300 mg by mouth at bedtime.   Yes Historical Provider, MD  metFORMIN (GLUCOPHAGE) 500 MG tablet Take 1 tablet (500 mg total) by mouth 2 (two) times daily with a meal. 03/02/16  Yes Jessica U Vann, DO  mometasone-formoterol (DULERA) 200-5 MCG/ACT AERO Inhale 2 puffs into the lungs 2 (two) times daily. 07/17/16  Yes Tanda Rockers, MD  nebivolol (BYSTOLIC) 5 MG tablet Take 5 mg by mouth daily.   Yes Historical Provider, MD  potassium chloride SA (K-DUR,KLOR-CON) 20 MEQ tablet Take 20 mEq by mouth daily.    Yes Historical Provider, MD  sodium chloride (OCEAN) 0.65 % SOLN nasal spray Place 1 spray into both nostrils daily as needed for congestion.    Yes Historical Provider, MD  ticagrelor (BRILINTA) 90 MG TABS tablet Take  1 tablet (90 mg total) by mouth every 12 (twelve) hours. 01/26/16  Yes Eileen Stanford, PA-C  valsartan (DIOVAN) 320 MG tablet Take 1 tablet (320 mg total) by mouth daily. 01/26/16  Yes Eileen Stanford, PA-C  azithromycin (ZITHROMAX) 250 MG tablet Take 2 on day one then 1 daily x 4 days Patient taking differently: Take 250-500 mg by mouth See admin instructions. Take 2 tablets (500 mg) by mouth 1st day, then take 1 tablet (250 mg) daily on days 2-5 07/17/16   Tanda Rockers, MD  nitroGLYCERIN (NITROSTAT) 0.4 MG SL tablet Place 0.4 mg under the tongue every 5 (five) minutes as needed for chest pain. x3 as needed    Historical Provider, MD  predniSONE (DELTASONE) 10 MG tablet Take  4 each am x 2 days,   2 each am x 2 days,  1 each am x 2 days and stop Patient taking differently: Take 10-40 mg by mouth See admin instructions. Take 4 tablets (40 mg) by mouth daily for 2 days, then take 2 tablets (20 mg) daily for 2 days, then take 1 tablet (10 mg) daily for 2 days, then stop 07/17/16   Tanda Rockers, MD  Respiratory Therapy Supplies (FLUTTER) DEVI Use as directed 02/23/16   Tanda Rockers, MD    Physical Exam: Vitals:   07/17/16 2115 07/17/16 2205 07/17/16 2346 07/18/16 0331  BP:  (!) 151/70  (!) 162/76  Pulse: 86 84 85 76  Resp: 18 18 18 18   Temp:  97.9 F (36.6 C)  98.4 F (36.9 C)  TempSrc:  Oral  Oral  SpO2: 95% 100% 100% 100%  Weight:  73.2 kg (161 lb 4.8 oz)  72.5 kg (159 lb 12.8 oz)  Height:  5' 2.5" (1.588 m)     General: Not in acute distress HEENT:       Eyes: PERRL, EOMI, no scleral icterus.       ENT: No discharge from the ears and nose, no pharynx injection, no tonsillar enlargement.        Neck: No JVD, no bruit, no mass felt. Heme: No neck lymph node enlargement. Cardiac: S1/S2, RRR, No murmurs, No gallops or rubs. Respiratory: has wheezing bilaterally. No rales or rubs. GI: Soft, nondistended, nontender, no rebound pain, no organomegaly, BS present. GU: No  hematuria Ext: has trace leg edema bilaterally. 2+DP/PT pulse bilaterally. Musculoskeletal: No joint deformities, No joint redness or warmth, no limitation of ROM in spin.  Skin: No rashes.  Neuro: Alert, oriented X3, cranial nerves II-XII grossly intact, moves all extremities normally. Psych: Patient is not psychotic, no suicidal or hemocidal ideation.  Labs on Admission: I have personally reviewed following labs and imaging studies  CBC:  Recent Labs Lab 07/17/16 1828  WBC 4.3  NEUTROABS 2.0  HGB 10.6*  HCT 33.2*  MCV 94.9  PLT 616   Basic Metabolic Panel:  Recent Labs Lab 07/17/16 1828  NA 136  K 3.6  CL 99*  CO2 26  GLUCOSE 111*  BUN 24*  CREATININE 1.62*  CALCIUM 8.8*   GFR: Estimated Creatinine Clearance: 31.8 mL/min (A) (by C-G formula based on SCr of 1.62 mg/dL (H)). Liver Function Tests: No results for input(s): AST, ALT, ALKPHOS, BILITOT, PROT, ALBUMIN in the last 168 hours. No results for input(s): LIPASE, AMYLASE in the last 168 hours. No results for input(s): AMMONIA in the last 168 hours. Coagulation Profile:  Recent Labs Lab 07/17/16 2209  INR 0.97   Cardiac Enzymes:  Recent Labs Lab 07/17/16 2042 07/18/16 0142  TROPONINI <0.03 <0.03   BNP (last 3 results)  Recent Labs  02/23/16 1122  PROBNP 13.0   HbA1C: No results for input(s): HGBA1C in the last 72 hours. CBG:  Recent Labs Lab 07/17/16 2215  GLUCAP 251*   Lipid Profile:  Recent Labs  07/18/16 0142  CHOL 196  HDL 48  LDLCALC 107*  TRIG 203*  CHOLHDL 4.1   Thyroid Function Tests: No results for input(s): TSH, T4TOTAL, FREET4, T3FREE, THYROIDAB in the last 72 hours. Anemia Panel: No results for input(s): VITAMINB12, FOLATE, FERRITIN, TIBC, IRON, RETICCTPCT in the last 72 hours. Urine analysis:    Component Value Date/Time   COLORURINE YELLOW 08/07/2014 1304   APPEARANCEUR CLEAR 08/07/2014 1304   LABSPEC 1.012 08/07/2014 1304   PHURINE 5.0 08/07/2014 1304    GLUCOSEU NEGATIVE 08/07/2014 1304   HGBUR NEGATIVE 08/07/2014 1304   BILIRUBINUR NEGATIVE 08/07/2014 1304   KETONESUR NEGATIVE 08/07/2014 1304   PROTEINUR NEGATIVE 08/07/2014 1304   UROBILINOGEN 0.2 08/07/2014 1304   NITRITE NEGATIVE 08/07/2014 1304   LEUKOCYTESUR NEGATIVE 08/07/2014 1304   Sepsis Labs: @LABRCNTIP (procalcitonin:4,lacticidven:4) )No results found for this or any previous visit (from the past 240 hour(s)).   Radiological Exams on Admission: Dg Chest Portable 1 View  Result Date: 07/17/2016 CLINICAL DATA:  Shortness of breath and wheezing EXAM: PORTABLE CHEST 1 VIEW COMPARISON:  03/23/2016 FINDINGS: The heart size and mediastinal contours are within normal limits. Both lungs are clear. The visualized skeletal structures are unremarkable. IMPRESSION: No active disease. Electronically Signed   By: Inez Catalina M.D.   On: 07/17/2016 18:36     EKG: Not done in ED, will get one.   Assessment/Plan Principal Problem:   Asthma exacerbation Active Problems:   Diabetes mellitus type 2, uncontrolled ([place])   Depression with anxiety   Essential hypertension   G E R D   PULMONARY EMBOLISM, HX OF   Chest pain   Acute respiratory failure (HCC)   CAD in native artery   HLD (hyperlipidemia)   Chronic diastolic CHF (congestive heart failure) ([facility])   Acute renal failure superimposed on stage 3 chronic kidney disease (HCC)   Influenza B   Acute respiratory failure without hypoxia due to Asthma exacerbation: Patient's SOB, cough, and wheezing on auscultation are consistent with asthma exacerbation. Chest x-rays negative for infiltration. Pt has received 2 g of magnesium sulfate and one dose of Solu-Medrol in ED  -will admit to  tele bed as inpt -Nebulizers: scheduled Duoneb and prn albuterol; Scheduled Atrovent and prn albuterol nebs -Solu-Medrol 60 mg IV q8h  -start Singulair and Z-Pak -Mucinex for cough  -Urine S. pneumococcal antigen -Follow up blood culture x2, sputum  culture, Flu pcr  Addendum: Flu pcr postive for Flu B -start Tamiflu  -will d/c azithromycin.  Chest pain: likely due to coughing. Pt has hx of PE and is currently on Estraiol which increases risk of getting blood clot. Will need to r/o PE. Pt has has hx of CAD and NSTEMI, currently on ASA and brilinta. Will also need to r/o ACS - will get D-dimer. If positive-->will start IV heparin and get V/Q scan in AM. - cycle CE q6 x3 and repeat her EKG in the am  - Nitroglycerin, Morphine, and aspirin and brilinta,  - will hold bystolic due to wheezing - continue cardizem - Risk factor stratification: will check FLP and A1C   Addendum: D-dimer positive 0.55 -will start IV heparin  -get V/Q scan in AM -check LE venous doppler  Hx of CAD and NSTEMI: -see above  DM-II: Last A1c 7.0 on 01/16/16, well controled. Patient is taking metformin at home -SSI  Anxiety:  -continue Xanax  Chronic diastolic CHF : 2-D echo on 01/18/16 showed EF 65-70 percent with grade 2 diastolic dysfunction. Patient has trace amount of leg edema, no JVD. CHF seems to be compensated on admission. Patient hold Lasix due to worsening renal function -continue ASA  HTN: -Continue Cardizem -hold lasix and diovan as above -iV hydralazine when necessary  G E R D: -pepcid    AoCKD-III: Baseline Cre is 1.18 on 03/23/16, pt's Cre is 1.62on admission. Likely due to prerenal secondary to dehydration and continuation of ARB, diruetics - Check FeUrea - Follow up renal function by BMP - Hold diovan and lasix  DVT ppx: SQ Lovenox Code Status: Full code Family Communicatio: yes, patient's  Husband at bed side Disposition Plan:  Anticipate discharge back to previous home environment Consults called:  none Admission status: inpatient/tele  Date of Service 07/18/2016    Ivor Costa [facility] Pager [phone]  If 7PM-7AM, please contact night-coverage [email or web address] Password [email or web address] 07/18/2016, 3:41 AM

## 2016-07-17 NOTE — Progress Notes (Signed)
Treatment completed at this time

## 2016-07-17 NOTE — ED Notes (Signed)
Attempted report x1. 

## 2016-07-17 NOTE — Progress Notes (Signed)
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 Asthma     Brief history of present illness from the day of admission and additional interim summary    [name] a 66 y.o.femalewith medical history significant for hypertension, diabetes on metformin, history of prior PE and asthma. This past Thursday patient began having upper respiratory infection symptoms with cough and congestion with productive yellow sputum. Because of this she developed asthma exacerbation symptoms which are typical for her with increased wheezing and tightness. She is not had fevers or chills. She's not had any GI symptoms such as nausea, vomiting or diarrhea. She went to her PCP who prescribed prednisone. In addition she utilized her normal nebulizers and MDIs without improvement in her symptoms. Because of increased respiratory distress she presented to the ER today. No documentation from ER to determine if patient was hypoxemic at presentation. She was wheezing significantly upon arrival and becoming dyspneic with talking. She was also reporting pleuritic chest tightness with taking a deep breath.   She has been exposed to multiple sick contacts with similar symptoms prior to onset of her symptoms.She was positive for coronavirus which likely  exacerbated her asthma, also had NSTEMI requiring left heart cath and stent placement.   Hospital issues addressed     1. Acute on chronic asthma exacerbation. She still wheezing, continue IV steroids, added Singulair, added nebulizer treatments both scheduled and as needed, will add Dulera as well and monitor. Respiratory panel positive for coronavirus likely URI exacerbated her asthma + dCHF, SOB better, she has no oxygen requirement for the last 2 days, she still has minimal wheezing but this is mostly upper airway coming from her throat, will be placed on steroid taper, continue home nebulizer treatments and follow with Dr. Melvyn Novas her pulmonary physician in a week.  2. Hypertension. Hydralazine dose doubled, ARB + Cardizem. Follow with PCP for blood pressure control.  3. History of PE. Normal d-dimer.  4. Hypokalemia. Replaced and stable.  5. GERD. On PPI.  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 Continue statin & Cardizem, no B.Blocker due to Asthma, pain free. All with cardiology outpatient.  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  8. DM2 - continue Home rx.   Discharge diagnosis     Principal Problem:   Acute bronchitis with asthma with acute exacerbation Active Problems:   Diabetes mellitus type 2, uncontrolled ([place])   Essential  hypertension   G E R D   PULMONARY EMBOLISM, HX OF   Hyperlipidemia   Chest pain   Acute hypokalemia   Hypomagnesemia   Acute respiratory failure (HCC)   NSTEMI (non-ST elevated myocardial infarction) (HCC)   CAD in native artery   Hypertensive heart disease    01/24/2016  Extended post hosp f/u ov/ transition of care/ /[name] re: dtca asthma/ vcd symb 160 and pred 5  x 10 mg daily  Chief Complaint  Patient presents with  . Follow-up    Pt. recently got out the hopistal for an asthma attack, Pt. states her breathing remains the same, coughing with some yellow to white mucus,Has been needing to use Ventolin more often  last doing well 2 months prior to OV   While on Symb 160/ gerd rx but not dosing ac as rec  Waking up prematurely x 2 months with cough/wheeze/ sob but never contacted me as per the instructions rec Plan A = Automatic = dulera 100 Take 2 puffs first thing in am and then another 2 puffs about 12 hours later.                                      Nexum 40 mg Take 30-60 min before first meal of the day and Pepcid AC 20 mg  at bedtime Work on inhaler technique  Plan B = Backup Only use your albuterol as a rescue medication Plan C = Crisis - only use your albuterol nebulizer if you first try Plan B and it fails to help > ok to use the nebulizer up to every 4 hours but if start needing it regularly call for immediate appointment GERD diet  Take delsym two tsp every 12 hours and use the flutter valve as much as possible and supplement if needed with  tramadol 50 mg up to 2 every 4 hours to suppress the urge to cough  Once you have eliminated the cough for 3 straight days try reducing the tramadol first,  then the delsym as tolerated.   Prednisone 5 mg x 8, then 6, then 4 then 2, then 1 daily x 2 days and off  please schedule a follow up office visit in 2 weeks, sooner if needed with all meds/ inhalers/ neb solutions in hand     02/23/2016  f/u ov/[name] re: dtca vs vcd/ did not bring meds as req Chief Complaint  Patient presents with  . Follow-up    Cough had stopped and then resumed again 1 day ago. She states she woke up coughing at 2 am- non prod and also having chest tightness.   husband says throat clearing never stops x years  Some better on pred but flared w/in a few days of stopping it   rec Plan A = Automatic = dulera 100 Take 2 puffs  first thing in am and then another 2 puffs about 12 hours later.                                      Nexum 40 mg Take 30-60 min before first meal of the day and Pepcid AC 20 mg  at bedtime  Add neurontin (gabapentin) 100 mg three times daily  Work on perfecting  inhaler technique: Plan B = Backup Only use your albuterol as a rescue medication Plan C = Crisis - only use your albuterol nebulizer Take delsym two tsp every 12 hours and use the flutter valve as much as possible and supplement if needed with  Tylenol #3   up to 1-2 every 4 hours to suppress the urge to cough  Once you have eliminated the cough for 3 straight days try reducing the tylenol #3  first,  then the delsym as tolerated.   Prednisone 10 mg take  4 each am x 2 days,   2 each am x 2 days,  1 each am x 2 days and stop  Please remember to go to the lab  department downstairs for your tests - we will call you with the results when they are available. Please schedule a follow up office visit in 2 weeks, sooner if needed with all meds/ inhalers/ neb solutions in hand to see [name] NP for med calendar Late add : given dulera 100 x one sample = 60 pffs - check count on return   02/27/16 admit > no better at all    03/10/2016 NP Post hospital follow up  She presents for a post hospital follow-up Patient was admitted October 29 through November 2 for an asthma exacerbation  treated with IV steroids, and transitioned over to prednisone. At discharge. D-dimer was negative. Venous Dopplers were negative. She is feeling some better, cough and wheezing are decreased.  Does have some post nasal drainage. rec Add Zyrtec 10mg . At bedtime   Saline nasal rinses As needed   Delsym 2 tsp Twice daily  As needed  Cough  Finish Prednisone as directed.  Follow up with Dr. Melvyn Novas  In 4 weeks and As needed   Please contact office for sooner follow up if symptoms do not improve or worsen or seek emergency care         04/07/2016  exteneded f/u ov/[name] re:  Cough/wheezing and sob since July 2017 with nl spirometry today  Chief Complaint  Patient presents with  . Follow-up    Coughing and wheezing "all day"- cough is occ prod with yellow sputum and she has also had yellow nasal d/c. Her breathing is "so so". She is using her albuterol inhaler every 4 hours "I always need it".  She had been using neb 2 x daily, but has not used for the past several days.   no meds/ never got calendar/ not clear at all what she's taking other than way too much saba rec Stop dulera for now and continue symbicort 80 2bid  Work on inhaler technique:  relax and gently blow all the way out then take a nice smooth deep breath back in, triggering the inhaler at same time you start breathing in.  Hold for up to 5 seconds if you can. Blow out thru nose. Rinse and gargle with water when done For cough > cough into the flutter valve and increase the neurontin to 300 mg three times daily  Augmentin 875 mg take one pill twice daily  X 10 days - take at breakfast and supper with large glass of water.  It would help reduce the usual side effects (diarrhea and yeast infections) if you ate cultured yogurt at lunch.  Plan A = Automatic = Symbicort 80 Take 2 puffs first thing in am and then another 2 puffs about 12 hours  later.  Plan B = Backup Only use your albuterol (Proair) as a rescue medication to be used if you can't catch your breath by resting or doing a relaxed purse lip breathing pattern.  - The less you use it, the better it will work when you need it. - Ok to use the inhaler up to 2 puffs  every 4 hours if you must but call for appointment if use goes up over your usual need - Don't leave home without it !!  (think of it like the spare tire for your car)  Plan C = Crisis - only use your albuterol nebulizer if you first try Plan B and it fails to help > ok to use the nebulizer up to every 4 hours but if start needing it regularly call  for immediate appointment    05/08/2016  f/u ov/[name] re: asthma/ vcd on symb 80 2bid / gabapentin 300 tid  Chief Complaint  Patient presents with  . Follow-up    1 mo f/u. No asthma flare ups since last visit. No breathing issues. Denies any SOB or chest pains.   Not limited by breathing from desired activities   rec No change in medications Work on inhaler technique:  relax and gently blow all the way out then take a nice smooth deep breath back in, triggering the inhaler at same time you start breathing in.  Hold for up to 5 seconds if you can. Blow out thru nose. Rinse and gargle with water when done   06/05/16 NP med calendar  Decrease Gabapentin 300mg  At bedtime  . Follow med calendar closely and bring to each visit  Follow up Dr. Melvyn Novas  In 6-8 weeks and As needed      07/17/2016 acute extended ov/[name] re: asthma flare / has med calendar but not complete (no saba)  Not using flutter / dulera 200 2bid Chief Complaint  Patient presents with  . Acute Visit    Increased cough for the past 3-4 days. Cough is occ prod with yellow sputum.  She has also noticed some wheezing and left ear pain.   sick onset 3-4 days ears hurt/ nasal congested and then started coughing only used saba x 2 puffs and not the nebulizer Poor insight into action plan previously reviewed Also poor hfa   No sob at rest  No obvious patterns in day to day or daytime variability or assoc excess/ purulent sputum or mucus plugs or hemoptysis or cp or chest tightness, subjective wheeze or overt sinus or hb symptoms. No unusual exp hx or h/o childhood pna/ asthma or knowledge of premature birth.  Sleeping ok without nocturnal  or early am exacerbation  of respiratory  c/o's or need for noct saba. Also denies any obvious fluctuation of symptoms with weather or environmental changes or other aggravating or alleviating factors except as outlined above   Current Medications, Allergies, Complete Past Medical History, Past  Surgical History, Family History, and Social History were reviewed in [employer] record.  ROS  The following are not active complaints unless bolded sore throat, dysphagia, dental problems, itching, sneezing,  nasal congestion or excess/ purulent secretions, ear ache,   fever, chills, sweats, unintended wt loss, classically pleuritic or exertional cp,  orthopnea pnd or leg swelling, presyncope, palpitations, abdominal pain, anorexia, nausea, vomiting, diarrhea  or change in bowel or bladder habits, change in stools or urine, dysuria,hematuria,  rash, arthralgias, visual complaints, headache, numbness, weakness or ataxia or problems with walking  or coordination,  change in mood/affect or memory.              Objective:   Physical Exam  amb bf nad     02/23/2016   168 >  04/07/2016  168 > 05/08/2016    168 >  07/17/2016  162    Vital signs reviewed    - Note on arrival 02 sats  100% on RA    HEENT: nl dentition, turbinates, and orophanx. Nl external ear canals without cough reflex   NECK :  without JVD/Nodes/TM/ nl carotid upstrokes bilaterally   LUNGS: no acc muscle use,   Pan exp bilateral sonorous rhonchi with upper airway component   CV:  RRR  no s3 or murmur or increase in P2, no edema   ABD:  soft and nontender with nl excursion in the supine position. No bruits or organomegaly, bowel sounds nl  MS:  warm without deformities, calf tenderness, cyanosis or clubbing  SKIN: warm and dry without lesions    NEURO:  alert, approp, no deficits      pCXR     07/17/2016 :    I personally reviewed images and agree with radiology impression as follows:   No active disease.

## 2016-07-17 NOTE — ED Notes (Signed)
Palma Holter, RN accepts report at this time.

## 2016-07-18 ENCOUNTER — Inpatient Hospital Stay (HOSPITAL_COMMUNITY): Payer: Medicare Other

## 2016-07-18 ENCOUNTER — Other Ambulatory Visit (HOSPITAL_COMMUNITY): Payer: Self-pay

## 2016-07-18 DIAGNOSIS — E1165 Type 2 diabetes mellitus with hyperglycemia: Secondary | ICD-10-CM

## 2016-07-18 DIAGNOSIS — J4531 Mild persistent asthma with (acute) exacerbation: Secondary | ICD-10-CM | POA: Diagnosis not present

## 2016-07-18 DIAGNOSIS — J101 Influenza due to other identified influenza virus with other respiratory manifestations: Secondary | ICD-10-CM | POA: Diagnosis present

## 2016-07-18 DIAGNOSIS — I2510 Atherosclerotic heart disease of native coronary artery without angina pectoris: Secondary | ICD-10-CM

## 2016-07-18 DIAGNOSIS — E1140 Type 2 diabetes mellitus with diabetic neuropathy, unspecified: Secondary | ICD-10-CM

## 2016-07-18 DIAGNOSIS — Z86711 Personal history of pulmonary embolism: Secondary | ICD-10-CM

## 2016-07-18 LAB — CBC
HEMATOCRIT: 32.7 % — AB (ref 36.0–46.0)
HEMOGLOBIN: 10.5 g/dL — AB (ref 12.0–15.0)
MCH: 30.3 pg (ref 26.0–34.0)
MCHC: 32.1 g/dL (ref 30.0–36.0)
MCV: 94.5 fL (ref 78.0–100.0)
Platelets: 286 10*3/uL (ref 150–400)
RBC: 3.46 MIL/uL — AB (ref 3.87–5.11)
RDW: 13.1 % (ref 11.5–15.5)
WBC: 4.9 10*3/uL (ref 4.0–10.5)

## 2016-07-18 LAB — LIPID PANEL
CHOL/HDL RATIO: 4.1 ratio
CHOLESTEROL: 196 mg/dL (ref 0–200)
HDL: 48 mg/dL (ref >40)
LDL CALC: 107 mg/dL — AB (ref 0–99)
Triglycerides: 203 mg/dL — ABNORMAL HIGH (ref <150)
VLDL: 41 mg/dL — AB (ref 0–40)

## 2016-07-18 LAB — GLUCOSE, CAPILLARY
Glucose-Capillary: 177 mg/dL — ABNORMAL HIGH (ref 65–99)
Glucose-Capillary: 167 mg/dL — ABNORMAL HIGH (ref 65–99)
Glucose-Capillary: 207 mg/dL — ABNORMAL HIGH (ref 65–99)
Glucose-Capillary: 234 mg/dL — ABNORMAL HIGH (ref 65–99)

## 2016-07-18 LAB — HIV ANTIBODY (ROUTINE TESTING W REFLEX): HIV SCREEN 4TH GENERATION: NONREACTIVE

## 2016-07-18 LAB — CREATININE, URINE, RANDOM: CREATININE, URINE: 83.75 mg/dL

## 2016-07-18 LAB — STREP PNEUMONIAE URINARY ANTIGEN: STREP PNEUMO URINARY ANTIGEN: NEGATIVE

## 2016-07-18 LAB — TROPONIN I

## 2016-07-18 LAB — BRAIN NATRIURETIC PEPTIDE: B NATRIURETIC PEPTIDE 5: 9.2 pg/mL (ref 0.0–100.0)

## 2016-07-18 LAB — HEPARIN LEVEL (UNFRACTIONATED): Heparin Unfractionated: 1.62 IU/mL — ABNORMAL HIGH (ref 0.30–0.70)

## 2016-07-18 MED ORDER — ARFORMOTEROL TARTRATE 15 MCG/2ML IN NEBU
15.0000 ug | INHALATION_SOLUTION | Freq: Two times a day (BID) | RESPIRATORY_TRACT | Status: DC
  Administered 2016-07-18 – 2016-07-20 (×6): 15 ug via RESPIRATORY_TRACT
  Filled 2016-07-18 (×7): qty 2

## 2016-07-18 MED ORDER — TECHNETIUM TO 99M ALBUMIN AGGREGATED
4.0000 | Freq: Once | INTRAVENOUS | Status: AC | PRN
  Administered 2016-07-18: 4 via INTRAVENOUS

## 2016-07-18 MED ORDER — GUAIFENESIN-CODEINE 100-10 MG/5ML PO SOLN
5.0000 mL | Freq: Four times a day (QID) | ORAL | Status: DC | PRN
  Administered 2016-07-18 – 2016-07-19 (×2): 5 mL via ORAL
  Filled 2016-07-18 (×2): qty 5

## 2016-07-18 MED ORDER — IPRATROPIUM-ALBUTEROL 0.5-2.5 (3) MG/3ML IN SOLN
3.0000 mL | Freq: Four times a day (QID) | RESPIRATORY_TRACT | Status: DC
  Administered 2016-07-19 – 2016-07-20 (×8): 3 mL via RESPIRATORY_TRACT
  Filled 2016-07-18 (×11): qty 3

## 2016-07-18 MED ORDER — IPRATROPIUM-ALBUTEROL 0.5-2.5 (3) MG/3ML IN SOLN
3.0000 mL | RESPIRATORY_TRACT | Status: DC
  Administered 2016-07-18 (×2): 3 mL via RESPIRATORY_TRACT
  Filled 2016-07-18 (×2): qty 3

## 2016-07-18 MED ORDER — TECHNETIUM TC 99M DIETHYLENETRIAME-PENTAACETIC ACID
31.0000 | Freq: Once | INTRAVENOUS | Status: AC | PRN
  Administered 2016-07-18: 31 via INTRAVENOUS

## 2016-07-18 MED ORDER — ENOXAPARIN SODIUM 40 MG/0.4ML ~~LOC~~ SOLN
40.0000 mg | SUBCUTANEOUS | Status: DC
  Administered 2016-07-18 – 2016-07-20 (×3): 40 mg via SUBCUTANEOUS
  Filled 2016-07-18 (×3): qty 0.4

## 2016-07-18 MED ORDER — GUAIFENESIN ER 600 MG PO TB12
600.0000 mg | ORAL_TABLET | Freq: Two times a day (BID) | ORAL | Status: DC
Start: 2016-07-18 — End: 2016-07-21
  Administered 2016-07-18 – 2016-07-21 (×7): 600 mg via ORAL
  Filled 2016-07-18 (×7): qty 1

## 2016-07-18 MED ORDER — AZITHROMYCIN 250 MG PO TABS: 250.0000 mg | ORAL_TABLET | Freq: Every day | ORAL | Status: DC

## 2016-07-18 MED ORDER — IPRATROPIUM-ALBUTEROL 0.5-2.5 (3) MG/3ML IN SOLN
RESPIRATORY_TRACT | Status: AC
  Administered 2016-07-18: 3 mL via RESPIRATORY_TRACT
  Filled 2016-07-18: qty 3

## 2016-07-18 MED ORDER — HEPARIN (PORCINE) IN NACL 100-0.45 UNIT/ML-% IJ SOLN
850.0000 [IU]/h | INTRAMUSCULAR | Status: DC
  Administered 2016-07-18: 850 [IU]/h via INTRAVENOUS

## 2016-07-18 MED ORDER — OSELTAMIVIR PHOSPHATE 30 MG PO CAPS
30.0000 mg | ORAL_CAPSULE | Freq: Two times a day (BID) | ORAL | Status: DC
  Administered 2016-07-18 – 2016-07-21 (×8): 30 mg via ORAL
  Filled 2016-07-18 (×9): qty 1

## 2016-07-18 MED ORDER — IPRATROPIUM-ALBUTEROL 0.5-2.5 (3) MG/3ML IN SOLN
3.0000 mL | Freq: Three times a day (TID) | RESPIRATORY_TRACT | Status: DC
  Administered 2016-07-18: 3 mL via RESPIRATORY_TRACT

## 2016-07-18 MED ORDER — BENZONATATE 100 MG PO CAPS
100.0000 mg | ORAL_CAPSULE | Freq: Three times a day (TID) | ORAL | Status: DC
  Administered 2016-07-18 – 2016-07-21 (×10): 100 mg via ORAL
  Filled 2016-07-18 (×10): qty 1

## 2016-07-18 NOTE — Progress Notes (Signed)
RT Note: Patient was given a flutter valve per MD order. She was instructed on proper flutter valve use and it's indications. She was familiar with a flutter valve. She demonstrated the the technique well with no issues. Rt will continue to monitor.

## 2016-07-18 NOTE — Progress Notes (Signed)
Pt slept well overnight, vitals stable, is in droplet isolation for Influenza B positive, Tamiflu started this am, IV heparin continue @11ml /hr, will continue to monitor the patient

## 2016-07-18 NOTE — Progress Notes (Signed)
As per Pharmacist, pt's heparin drip is stopped for an hour, will restart at 7am

## 2016-07-18 NOTE — Progress Notes (Signed)
[facility]                                                                              Patient Demographics  [name], is a 66 y.o. female, DOB - Sep 12, 1950, RSW:[id number]  Admit date - 07/17/2016   Admitting Physician Ivor Costa, MD  Outpatient Primary MD for the patient is [name], MD  Outpatient specialists:   LOS - 1  days    Chief Complaint  Patient presents with  . Shortness of Breath       Brief summary   [name] is a 66 y.o. female with medical history significant of asthma, hypertension, hyperlipidemia, type it is mellitus, GERD, depression, Slightly, CAD, non-STEMI (S A/P of stent placement 01/19/16), dCHF, CKD-III, PE, who presented with shortness of breath, cough, wheezing and chest pain. Patient was found to have influenza B positive Pt was seen by her pulmonologist, Dr. Melvyn Novas on the day of admission and was given prescription of Singulair, prednisone and azithromycin, but pt has not started taking these new meds yet.   Assessment & Plan    Principal Problem: Acute respiratory failure without hypoxia due to Asthma exacerbation, Likely triggered by influenza B: - Chest x-ray negative for infiltrates.  - Continue IV Solu-Medrol, scheduled duonebs, brovana  -Continue Mucinex, Tessalon, Robitussin for cough  -Urine S. pneumococcal antigen negative -Continue Tamiflu day #1  Active problems Chest pain: likely due to coughing. Pt has hx of PE and is currently on Estradiol which increases risk of PE. Also has CAD and NSTEMI, currently on ASA and brilinta. - D-dimer was elevated 0.55, V/Q scan pending - For now continue IV heparin drip, if negative VQ scan, will discontinue IV heparin drip - Troponins 3 negative so far - Continue Cardizem, Nitroglycerin, Morphine, and aspirin and brilinta  - will hold bystolic due to wheezing - Follow lower extremity venous Dopplers - LDL 107  Hx of CAD and NSTEMI: -see above  DM-II: Last  A1c 7.0 on 01/16/16, well controled. Patient is taking metformin at home -Continue sliding scale insulin - Follow hemoglobin A1c  Anxiety:  -continue Xanax  Chronic diastolic CHF : 2-D echo on 01/18/16 showed EF 65-70 percent with grade 2 diastolic dysfunction. - CHF seems to be compensated on admission. - Currently Lasix on hold due to worsening renal function, obtain BMET -continue ASA  HTN: -Continue Cardizem -hold lasix and diovan as above, iv hydralazine prn   G E R D: -pepcid    Acute on chronic kidney disease stage III: Baseline Cre is 1.18 on 03/23/16, pt's Cre is 1.62on admission. -  Likely due to prerenal secondary to dehydration and continuation of ARB, diruetics -  Hold diovan and lasix, follow BMET   Code Status: full  DVT Prophylaxis:  heparin  family Communication: Discussed in detail with the patient, all imaging results, lab results explained to the patient   Disposition Plan:   Time Spent in minutes  25 minutes  Procedures:    Consultants:     Antimicrobials:   Tamiflu   Medications  Scheduled Meds: . arformoterol  15 mcg Nebulization BID  . aspirin  EC  81 mg Oral Daily  . benzonatate  100 mg Oral TID  . diltiazem  180 mg Oral Daily  . divalproex  500 mg Oral QHS  . estradiol  1 mg Oral Daily  . famotidine  20 mg Oral QHS  . gabapentin  300 mg Oral QHS  . guaiFENesin  600 mg Oral BID  . insulin aspart  0-5 Units Subcutaneous QHS  . insulin aspart  0-9 Units Subcutaneous TID WC  . ipratropium-albuterol  3 mL Nebulization Q4H  . methylPREDNISolone (SOLU-MEDROL) injection  125 mg Intravenous TID  . montelukast  10 mg Oral QHS  . oseltamivir  30 mg Oral BID  . ticagrelor  90 mg Oral Q12H   Continuous Infusions: . heparin 850 Units/hr (07/18/16 0700)   PRN Meds:.acetaminophen, albuterol, ALPRAZolam, fluticasone, guaiFENesin-codeine, hydrALAZINE, morphine injection, nitroGLYCERIN, ondansetron, sodium chloride, zolpidem   Antibiotics     Anti-infectives    Start     Dose/Rate Route Frequency Ordered Stop   07/18/16 1000  azithromycin (ZITHROMAX) tablet 250 mg  Status:  Discontinued     250 mg Oral Daily 07/17/16 1956 07/18/16 0346   07/18/16 1000  azithromycin (ZITHROMAX) tablet 250 mg  Status:  Discontinued     250 mg Oral Daily 07/18/16 0346 07/18/16 0349   07/18/16 0315  oseltamivir (TAMIFLU) capsule 30 mg     30 mg Oral 2 times daily 07/18/16 0300 07/22/16 2159   07/17/16 2000  azithromycin (ZITHROMAX) tablet 500 mg     500 mg Oral Daily 07/17/16 1956 07/17/16 2056        Subjective:   [name] was seen and examined today. Still having shortness of breath, wheezing and coughing. No fevers or chills. Patient denies dizziness, abdominal pain, N/V/D/C, new weakness, numbess, tingling.    Objective:   Vitals:   07/17/16 2115 07/17/16 2205 07/17/16 2346 07/18/16 0331  BP:  (!) 151/70  (!) 162/76  Pulse: 86 84 85 76  Resp: 18 18 18 18   Temp:  97.9 F (36.6 C)  98.4 F (36.9 C)  TempSrc:  Oral  Oral  SpO2: 95% 100% 100% 100%  Weight:  73.2 kg (161 lb 4.8 oz)  72.5 kg (159 lb 12.8 oz)  Height:  5' 2.5" (1.588 m)      Intake/Output Summary (Last 24 hours) at 07/18/16 1050 Last data filed at 07/18/16 0916  Gross per 24 hour  Intake           290.78 ml  Output              850 ml  Net          -559.22 ml     Wt Readings from Last 3 Encounters:  07/18/16 72.5 kg (159 lb 12.8 oz)  07/17/16 73.8 kg (162 lb 12.8 oz)  05/11/16 77.6 kg (171 lb)     Exam  General: Alert and oriented x 3, NAD  HEENT:    Neck: Supple, no JVD, no masses  Cardiovascular: S1 S2 auscultated, no rubs, murmurs or gallops. Regular rate and rhythm.  Respiratory: Bilateral expiratory wheezing  Gastrointestinal: Soft, nontender, nondistended, + bowel sounds  Ext: no cyanosis clubbing or edema  Neuro: AAOx3, Cr N's II- XII. Strength 5/5 upper and lower extremities bilaterally  Skin: No rashes  Psych: Normal affect  and demeanor, alert and oriented x3    Data Reviewed:  I have personally reviewed following labs and imaging studies  Micro Results No results found for  this or any previous visit (from the past 240 hour(s)).  Radiology Reports Dg Chest Portable 1 View  Result Date: 07/17/2016 CLINICAL DATA:  Shortness of breath and wheezing EXAM: PORTABLE CHEST 1 VIEW COMPARISON:  03/23/2016 FINDINGS: The heart size and mediastinal contours are within normal limits. Both lungs are clear. The visualized skeletal structures are unremarkable. IMPRESSION: No active disease. Electronically Signed   By: Inez Catalina M.D.   On: 07/17/2016 18:36    Lab Data:  CBC:  Recent Labs Lab 07/17/16 1828 07/18/16 0655  WBC 4.3 4.9  NEUTROABS 2.0  --   HGB 10.6* 10.5*  HCT 33.2* 32.7*  MCV 94.9 94.5  PLT 279 891   Basic Metabolic Panel:  Recent Labs Lab 07/17/16 1828  NA 136  K 3.6  CL 99*  CO2 26  GLUCOSE 111*  BUN 24*  CREATININE 1.62*  CALCIUM 8.8*   GFR: Estimated Creatinine Clearance: 31.8 mL/min (A) (by C-G formula based on SCr of 1.62 mg/dL (H)). Liver Function Tests: No results for input(s): AST, ALT, ALKPHOS, BILITOT, PROT, ALBUMIN in the last 168 hours. No results for input(s): LIPASE, AMYLASE in the last 168 hours. No results for input(s): AMMONIA in the last 168 hours. Coagulation Profile:  Recent Labs Lab 07/17/16 2209  INR 0.97   Cardiac Enzymes:  Recent Labs Lab 07/17/16 2042 07/18/16 0142 07/18/16 0655  TROPONINI <0.03 <0.03 <0.03   BNP (last 3 results)  Recent Labs  02/23/16 1122  PROBNP 13.0   HbA1C: No results for input(s): HGBA1C in the last 72 hours. CBG:  Recent Labs Lab 07/17/16 2215 07/18/16 0728  GLUCAP 251* 177*   Lipid Profile:  Recent Labs  07/18/16 0142  CHOL 196  HDL 48  LDLCALC 107*  TRIG 203*  CHOLHDL 4.1   Thyroid Function Tests: No results for input(s): TSH, T4TOTAL, FREET4, T3FREE, THYROIDAB in the last 72 hours. Anemia  Panel: No results for input(s): VITAMINB12, FOLATE, FERRITIN, TIBC, IRON, RETICCTPCT in the last 72 hours. Urine analysis:    Component Value Date/Time   COLORURINE YELLOW 08/07/2014 1304   APPEARANCEUR CLEAR 08/07/2014 1304   LABSPEC 1.012 08/07/2014 1304   PHURINE 5.0 08/07/2014 1304   GLUCOSEU NEGATIVE 08/07/2014 1304   HGBUR NEGATIVE 08/07/2014 1304   BILIRUBINUR NEGATIVE 08/07/2014 1304   KETONESUR NEGATIVE 08/07/2014 1304   PROTEINUR NEGATIVE 08/07/2014 1304   UROBILINOGEN 0.2 08/07/2014 1304   NITRITE NEGATIVE 08/07/2014 1304   LEUKOCYTESUR NEGATIVE 08/07/2014 1304     Renne Platts M.D. [facility] 07/18/2016, 10:50 AM  Pager: [phone] Between 7am to 7pm - call Pager - [phone]  After 7pm go to [email or web address] - password [email or web address]  Call night coverage person covering after 7pm

## 2016-07-18 NOTE — Progress Notes (Signed)
New pt admission from ED. Pt brought to the floor in stable condition. Vitals taken. Initial Assessment done. All immediate pertinent needs to patient addressed. Patient Guide given to patient. Important safety instructions relating to hospitalization reviewed with patient. Patient verbalized understanding. Will continue to monitor pt. 

## 2016-07-18 NOTE — Progress Notes (Signed)
ANTICOAGULATION CONSULT NOTE - Follow Up Consult  Pharmacy Consult for Heparin Indication: Rule out PE  Patient Measurements: Height: 5' 2.5" (158.8 cm) Weight: 159 lb 12.8 oz (72.5 kg) (scale c) IBW/kg (Calculated) : 51.25  Vital Signs: Temp: 98.4 F (36.9 C) (03/20 0331) Temp Source: Oral (03/20 0331) BP: 162/76 (03/20 0331) Pulse Rate: 76 (03/20 0331)  Labs:  Recent Labs  07/17/16 1828 07/17/16 2042 07/17/16 2209 07/18/16 0142 07/18/16 0352  HGB 10.6*  --   --   --   --   HCT 33.2*  --   --   --   --   PLT 279  --   --   --   --   LABPROT  --   --  12.9  --   --   INR  --   --  0.97  --   --   HEPARINUNFRC  --   --   --   --  1.62*  CREATININE 1.62*  --   --   --   --   TROPONINI  --  <0.03  --  <0.03  --     Assessment: Heparin for rule out PE, initial heparin level this AM is elevated at 1.62, confirmed with RN that lab drawn from opposite arm of heparin infusion.   Goal of Therapy:  Heparin level 0.3-0.7 units/ml Monitor platelets by anticoagulation protocol: Yes   Plan:  -Hold heparin x 1 hr -Re-start heparin infusion at 850 units/hr at 0700 -1500 HL -F/U PE work-up  Narda Bonds 07/18/2016,5:57 AM

## 2016-07-18 NOTE — Assessment & Plan Note (Addendum)
Flare in setting of poor insight into use of meds from med calendar ? Adherent   rec Add singulair trial basis  Albuterol neb x one Prednisone 10 mg take  4 each am x 2 days,   2 each am x 2 days,  1 each am x 2 days and stop  zpak  Tylenol #3 and flutter to control cyclical cough - The proper method of use, as well as anticipated side effects, of a metered-dose inhaler are discussed and demonstrated to the patient.   I had an extended discussion with the patient reviewing all relevant studies completed to date and  lasting 15 to 20 minutes of a 25 minute visit    Each maintenance medication was reviewed in detail including most importantly the difference between maintenance and prns and under what circumstances the prns are to be triggered using an action plan format that is not reflected in the computer generated alphabetically organized AVS but trather by a customized med calendar that reflects the AVS meds with confirmed 100% correlation.   In addition, Please see AVS for unique instructions that I personally wrote and verbalized to the the pt in detail and then reviewed with pt  by my nurse highlighting any  changes in therapy recommended at today's visit to their plan of care.

## 2016-07-18 NOTE — Progress Notes (Signed)
**  Preliminary report by tech**  Bilateral lower extremity venous duplex completed. There is no evidence of deep or superficial vein thrombosis involving the right and left lower extremities. All visualized vessels appear patent and compressible. There is no evidence of Baker's cysts bilaterally.  07/18/16 2:46 PM Carlos Levering RVT

## 2016-07-19 LAB — BASIC METABOLIC PANEL
Anion gap: 18 — ABNORMAL HIGH (ref 5–15)
BUN: 20 mg/dL (ref 6–20)
CHLORIDE: 100 mmol/L — AB (ref 101–111)
CO2: 17 mmol/L — ABNORMAL LOW (ref 22–32)
CREATININE: 1.27 mg/dL — AB (ref 0.44–1.00)
Calcium: 9 mg/dL (ref 8.9–10.3)
GFR calc Af Amer: 49 mL/min — ABNORMAL LOW (ref >60)
GFR calc non Af Amer: 43 mL/min — ABNORMAL LOW (ref >60)
Glucose, Bld: 277 mg/dL — ABNORMAL HIGH (ref 65–99)
Potassium: 4.5 mmol/L (ref 3.5–5.1)
SODIUM: 135 mmol/L (ref 135–145)

## 2016-07-19 LAB — GLUCOSE, CAPILLARY
GLUCOSE-CAPILLARY: 201 mg/dL — AB (ref 65–99)
Glucose-Capillary: 229 mg/dL — ABNORMAL HIGH (ref 65–99)
Glucose-Capillary: 207 mg/dL — ABNORMAL HIGH (ref 65–99)
Glucose-Capillary: 226 mg/dL — ABNORMAL HIGH (ref 65–99)

## 2016-07-19 LAB — CBC
HCT: 33.7 % — ABNORMAL LOW (ref 36.0–46.0)
Hemoglobin: 11 g/dL — ABNORMAL LOW (ref 12.0–15.0)
MCH: 30.9 pg (ref 26.0–34.0)
MCHC: 32.6 g/dL (ref 30.0–36.0)
MCV: 94.7 fL (ref 78.0–100.0)
Platelets: 348 10*3/uL (ref 150–400)
RBC: 3.56 MIL/uL — ABNORMAL LOW (ref 3.87–5.11)
RDW: 12.9 % (ref 11.5–15.5)
WBC: 14.5 10*3/uL — ABNORMAL HIGH (ref 4.0–10.5)

## 2016-07-19 LAB — EXPECTORATED SPUTUM ASSESSMENT W GRAM STAIN, RFLX TO RESP C

## 2016-07-19 LAB — HEMOGLOBIN A1C
Hgb A1c MFr Bld: 6.6 % — ABNORMAL HIGH (ref 4.8–5.6)
Mean Plasma Glucose: 143 mg/dL

## 2016-07-19 LAB — EXPECTORATED SPUTUM ASSESSMENT W REFEX TO RESP CULTURE

## 2016-07-19 LAB — UREA NITROGEN, URINE: Urea Nitrogen, Ur: 739 mg/dL

## 2016-07-19 MED ORDER — ESOMEPRAZOLE MAGNESIUM 20 MG PO CPDR
40.0000 mg | DELAYED_RELEASE_CAPSULE | Freq: Every day | ORAL | Status: DC
  Administered 2016-07-19 – 2016-07-20 (×2): 40 mg via ORAL
  Filled 2016-07-19 (×2): qty 2

## 2016-07-19 MED ORDER — ALUM & MAG HYDROXIDE-SIMETH 200-200-20 MG/5ML PO SUSP
15.0000 mL | ORAL | Status: DC | PRN
  Administered 2016-07-19 (×2): 15 mL via ORAL
  Filled 2016-07-19 (×2): qty 30

## 2016-07-19 MED ORDER — METHYLPREDNISOLONE SODIUM SUCC 125 MG IJ SOLR
80.0000 mg | Freq: Two times a day (BID) | INTRAMUSCULAR | Status: DC
  Administered 2016-07-19 (×2): 80 mg via INTRAVENOUS
  Filled 2016-07-19 (×2): qty 2

## 2016-07-19 NOTE — Progress Notes (Signed)
[facility]                                                                              Patient Demographics  [name], is a 66 y.o. female, DOB - 09-21-1950, [id number]  Admit date - 07/17/2016   Admitting Physician Ivor Costa, MD  Outpatient Primary MD for the patient is [name], MD  Outpatient specialists:   LOS - 2  days    Chief Complaint  Patient presents with  . Shortness of Breath       Brief summary   [name] is a 66 y.o. female with medical history significant of asthma, hypertension, hyperlipidemia, type it is mellitus, GERD, depression, Slightly, CAD, non-STEMI (S A/P of stent placement 01/19/16), dCHF, CKD-III, PE, who presented with shortness of breath, cough, wheezing and chest pain. Patient was found to have influenza B positive Pt was seen by her pulmonologist, Dr. Melvyn Novas on the day of admission and was given prescription of Singulair, prednisone and azithromycin, but pt has not started taking these new meds yet.   Assessment & Plan    Principal Problem: Acute respiratory failure without hypoxia due to Asthma exacerbation, triggered by influenza B: - Chest x-ray negative for infiltrates.  - Continue Tamiflu, IV Solu-Medrol-cut down dose, scheduled duonebs, brovana  -Continue Mucinex, Tessalon, Robitussin for cough   Pleuritic Chest pain: due to coughing from above. Pt has hx of PE and is currently on Estradiol which increases risk of PE. Also has CAD and NSTEMI, currently on ASA and brilinta, troponin negative - D-dimer was elevated 0.55, V/Q scan negtaive, dopplers negative - resolved  Hx of CAD and NSTEMI: -see above, troponin negative  DM-II: Last A1c 7.0 on 01/16/16, well controled. Patient is taking metformin at home -Continue sliding scale insulin - Follow hemoglobin A1c  Anxiety:  -continue Xanax  Chronic diastolic CHF : 2-D echo on 01/18/16 showed EF 65-70 percent with grade 2 diastolic dysfunction. -  CHF seems to be compensated on admission. - Currently Lasix on hold due to worsening renal function, resume tomorrow -continue ASA  HTN: -Continue Cardizem -hold lasix and diovan as above, iv hydralazine prn   G E R D: -pepcid    Acute on chronic kidney disease stage III: Baseline Cre is 1.18 on 03/23/16, pt's Cre is 1.62on admission. -  Likely due to prerenal secondary to dehydration and continuation of ARB, diruetics -  Improving, now 1.2  Code Status: full  DVT Prophylaxis:  Lovenox family Communication: Discussed in detail with the patient, all imaging results, lab results explained to the patient   Disposition Plan: Home in 1-2days  Time Spent in minutes  25 minutes  Procedures:    Consultants:     Antimicrobials:   Tamiflu   Medications  Scheduled Meds: . arformoterol  15 mcg Nebulization BID  . aspirin EC  81 mg Oral Daily  . benzonatate  100 mg Oral TID  . diltiazem  180 mg Oral Daily  . divalproex  500 mg Oral QHS  . enoxaparin (LOVENOX) injection  40 mg Subcutaneous Q24H  . esomeprazole  40 mg Oral QAC breakfast  .  estradiol  1 mg Oral Daily  . famotidine  20 mg Oral QHS  . gabapentin  300 mg Oral QHS  . guaiFENesin  600 mg Oral BID  . insulin aspart  0-5 Units Subcutaneous QHS  . insulin aspart  0-9 Units Subcutaneous TID WC  . ipratropium-albuterol  3 mL Nebulization Q6H  . methylPREDNISolone (SOLU-MEDROL) injection  80 mg Intravenous Q12H  . montelukast  10 mg Oral QHS  . oseltamivir  30 mg Oral BID  . ticagrelor  90 mg Oral Q12H   Continuous Infusions:  PRN Meds:.acetaminophen, albuterol, ALPRAZolam, fluticasone, guaiFENesin-codeine, hydrALAZINE, morphine injection, nitroGLYCERIN, ondansetron, sodium chloride, zolpidem   Antibiotics   Anti-infectives    Start     Dose/Rate Route Frequency Ordered Stop   07/18/16 1000  azithromycin (ZITHROMAX) tablet 250 mg  Status:  Discontinued     250 mg Oral Daily 07/17/16 1956 07/18/16 0346    07/18/16 1000  azithromycin (ZITHROMAX) tablet 250 mg  Status:  Discontinued     250 mg Oral Daily 07/18/16 0346 07/18/16 0349   07/18/16 0315  oseltamivir (TAMIFLU) capsule 30 mg     30 mg Oral 2 times daily 07/18/16 0300 07/22/16 2159   07/17/16 2000  azithromycin (ZITHROMAX) tablet 500 mg     500 mg Oral Daily 07/17/16 1956 07/17/16 2056        Subjective:   Still short of breathing but starting to improve   Objective:   Vitals:   07/19/16 0521 07/19/16 0800 07/19/16 0853 07/19/16 0941  BP: 135/81 (!) 154/69  (!) 141/68  Pulse: 72 65  79  Resp: 20 19  19   Temp: 98.2 F (36.8 C)     TempSrc: Oral     SpO2: 100% 100% 98% 98%  Weight: 73 kg (161 lb)     Height:        Intake/Output Summary (Last 24 hours) at 07/19/16 1135 Last data filed at 07/19/16 1000  Gross per 24 hour  Intake              240 ml  Output              320 ml  Net              -80 ml     Wt Readings from Last 3 Encounters:  07/19/16 73 kg (161 lb)  07/17/16 73.8 kg (162 lb 12.8 oz)  05/11/16 77.6 kg (171 lb)     Exam  General: Alert and oriented x 3, NAD  HEENT:  No JVD  Neck: Supple, no JVD, no masses  Cardiovascular: S1 S2 auscultated, no rubs, murmurs or gallops. Regular rate and rhythm.  Respiratory: Bilateral expiratory wheezing  Gastrointestinal: Soft, nontender, nondistended, + bowel sounds  Ext: no cyanosis clubbing or edema  Neuro: AAOx3, Cr N's II- XII. Strength 5/5 upper and lower extremities bilaterally  Skin: No rashes  Psych: Normal affect and demeanor, alert and oriented x3    Data Reviewed:  I have personally reviewed following labs and imaging studies  Micro Results Recent Results (from the past 240 hour(s))  Culture, blood (routine x 2) Call MD if unable to obtain prior to antibiotics being given     Status: None (Preliminary result)   Collection Time: 07/17/16  8:35 PM  Result Value Ref Range Status   Specimen Description BLOOD RIGHT ARM  Final    Special Requests BOTTLES DRAWN AEROBIC ONLY 10CC  Final   Culture NO GROWTH 2 DAYS  Final   Report Status PENDING  Incomplete  Culture, blood (routine x 2) Call MD if unable to obtain prior to antibiotics being given     Status: None (Preliminary result)   Collection Time: 07/17/16  8:47 PM  Result Value Ref Range Status   Specimen Description BLOOD LEFT ARM  Final   Special Requests IN PEDIATRIC BOTTLE 4CC  Final   Culture NO GROWTH 2 DAYS  Final   Report Status PENDING  Incomplete  Culture, sputum-assessment     Status: None   Collection Time: 07/19/16  1:16 AM  Result Value Ref Range Status   Specimen Description EXPECTORATED SPUTUM  Final   Special Requests NONE  Final   Sputum evaluation THIS SPECIMEN IS ACCEPTABLE FOR SPUTUM CULTURE  Final   Report Status 07/19/2016 FINAL  Final  Culture, respiratory (NON-Expectorated)     Status: None (Preliminary result)   Collection Time: 07/19/16  1:16 AM  Result Value Ref Range Status   Specimen Description EXPECTORATED SPUTUM  Final   Special Requests NONE Reflexed from [id number]  Final   Gram Stain   Final    FEW SQUAMOUS EPITHELIAL CELLS PRESENT FEW WBC PRESENT, PREDOMINANTLY MONONUCLEAR FEW GRAM POSITIVE COCCI IN PAIRS    Culture PENDING  Incomplete   Report Status PENDING  Incomplete    Radiology Reports Nm Pulmonary Perf And Vent  Result Date: 07/18/2016 CLINICAL DATA:  Cough, shortness of breath, wheezing, chest pain EXAM: NUCLEAR MEDICINE VENTILATION - PERFUSION LUNG SCAN TECHNIQUE: Ventilation images were obtained in multiple projections using inhaled aerosol Tc-28m DTPA. Perfusion images were obtained in multiple projections after intravenous injection of Tc-30m MAA. RADIOPHARMACEUTICALS:  31.0 mCi Technetium-4m DTPA aerosol inhalation and 4.2 mCi Technetium-57m MAA IV COMPARISON:  Chest x-ray 07/17/2016 FINDINGS: Ventilation: No focal ventilation defect. Perfusion: No wedge shaped peripheral perfusion defects to suggest acute  pulmonary embolism. IMPRESSION: No ventilation defects are noted.No wedge shaped peripheral perfusion defects to suggest acute pulmonary embolism. Electronically Signed   By: Lahoma Crocker M.D.   On: 07/18/2016 12:30   Dg Chest Portable 1 View  Result Date: 07/17/2016 CLINICAL DATA:  Shortness of breath and wheezing EXAM: PORTABLE CHEST 1 VIEW COMPARISON:  03/23/2016 FINDINGS: The heart size and mediastinal contours are within normal limits. Both lungs are clear. The visualized skeletal structures are unremarkable. IMPRESSION: No active disease. Electronically Signed   By: Inez Catalina M.D.   On: 07/17/2016 18:36    Lab Data:  CBC:  Recent Labs Lab 07/17/16 1828 07/18/16 0655 07/19/16 0540  WBC 4.3 4.9 14.5*  NEUTROABS 2.0  --   --   HGB 10.6* 10.5* 11.0*  HCT 33.2* 32.7* 33.7*  MCV 94.9 94.5 94.7  PLT 279 286 299   Basic Metabolic Panel:  Recent Labs Lab 07/17/16 1828 07/19/16 0540  NA 136 135  K 3.6 4.5  CL 99* 100*  CO2 26 17*  GLUCOSE 111* 277*  BUN 24* 20  CREATININE 1.62* 1.27*  CALCIUM 8.8* 9.0   GFR: Estimated Creatinine Clearance: 40.7 mL/min (A) (by C-G formula based on SCr of 1.27 mg/dL (H)). Liver Function Tests: No results for input(s): AST, ALT, ALKPHOS, BILITOT, PROT, ALBUMIN in the last 168 hours. No results for input(s): LIPASE, AMYLASE in the last 168 hours. No results for input(s): AMMONIA in the last 168 hours. Coagulation Profile:  Recent Labs Lab 07/17/16 2209  INR 0.97   Cardiac Enzymes:  Recent Labs Lab 07/17/16 2042 07/18/16 0142 07/18/16 0655  TROPONINI <0.03 <0.03 <  0.03   BNP (last 3 results)  Recent Labs  02/23/16 1122  PROBNP 13.0   HbA1C:  Recent Labs  07/18/16 0352  HGBA1C 6.6*   CBG:  Recent Labs Lab 07/18/16 0728 07/18/16 1137 07/18/16 1630 07/18/16 2138 07/19/16 0748  GLUCAP 177* 167* 207* 234* 229*   Lipid Profile:  Recent Labs  07/18/16 0142  CHOL 196  HDL 48  LDLCALC 107*  TRIG 203*    CHOLHDL 4.1   Thyroid Function Tests: No results for input(s): TSH, T4TOTAL, FREET4, T3FREE, THYROIDAB in the last 72 hours. Anemia Panel: No results for input(s): VITAMINB12, FOLATE, FERRITIN, TIBC, IRON, RETICCTPCT in the last 72 hours. Urine analysis:    Component Value Date/Time   COLORURINE YELLOW 08/07/2014 1304   APPEARANCEUR CLEAR 08/07/2014 1304   LABSPEC 1.012 08/07/2014 1304   PHURINE 5.0 08/07/2014 1304   GLUCOSEU NEGATIVE 08/07/2014 1304   HGBUR NEGATIVE 08/07/2014 1304   BILIRUBINUR NEGATIVE 08/07/2014 1304   KETONESUR NEGATIVE 08/07/2014 1304   PROTEINUR NEGATIVE 08/07/2014 1304   UROBILINOGEN 0.2 08/07/2014 1304   NITRITE NEGATIVE 08/07/2014 1304   LEUKOCYTESUR NEGATIVE 08/07/2014 [place] M.D. [facility] 07/19/2016, 11:35 AM  Pager: [phone]  After 7pm go to [email or web address] - password [email or web address]  Call night coverage person covering after 7pm

## 2016-07-19 NOTE — Progress Notes (Signed)
Pt complaining of pain. Pt points to left chest states it feels heavy. Morphine give, vital signs recorded and EKG completed. EKG shows normal sinus rhythm and placed in chart. Pt states morphine brings pain down to a 7/10. Paged MD, awaiting call back. Will continue to monitor pt  [name]

## 2016-07-19 NOTE — Progress Notes (Addendum)
Pt is alert and oriented with wheezing an persistent Cough. Current taking solumedrol, guaifenesin and Nebs. Sputum sample sent, and Pain Medication Given.

## 2016-07-19 NOTE — Progress Notes (Signed)
MD aware of pt condition, no new orders at this time  Orpah Hausner

## 2016-07-20 LAB — BASIC METABOLIC PANEL
ANION GAP: 13 (ref 5–15)
BUN: 22 mg/dL — ABNORMAL HIGH (ref 6–20)
CHLORIDE: 100 mmol/L — AB (ref 101–111)
CO2: 24 mmol/L (ref 22–32)
CREATININE: 1.01 mg/dL — AB (ref 0.44–1.00)
Calcium: 9.1 mg/dL (ref 8.9–10.3)
GFR calc non Af Amer: 56 mL/min — ABNORMAL LOW (ref >60)
Glucose, Bld: 211 mg/dL — ABNORMAL HIGH (ref 65–99)
Potassium: 4.4 mmol/L (ref 3.5–5.1)
SODIUM: 137 mmol/L (ref 135–145)

## 2016-07-20 LAB — CBC
HEMATOCRIT: 31.5 % — AB (ref 36.0–46.0)
HEMOGLOBIN: 10.4 g/dL — AB (ref 12.0–15.0)
MCH: 31.3 pg (ref 26.0–34.0)
MCHC: 33 g/dL (ref 30.0–36.0)
MCV: 94.9 fL (ref 78.0–100.0)
Platelets: 289 10*3/uL (ref 150–400)
RBC: 3.32 MIL/uL — AB (ref 3.87–5.11)
RDW: 13.3 % (ref 11.5–15.5)
WBC: 12.4 10*3/uL — AB (ref 4.0–10.5)

## 2016-07-20 LAB — GLUCOSE, CAPILLARY
GLUCOSE-CAPILLARY: 202 mg/dL — AB (ref 65–99)
GLUCOSE-CAPILLARY: 195 mg/dL — AB (ref 65–99)
GLUCOSE-CAPILLARY: 266 mg/dL — AB (ref 65–99)
GLUCOSE-CAPILLARY: 242 mg/dL — AB (ref 65–99)

## 2016-07-20 MED ORDER — METHYLPREDNISOLONE SODIUM SUCC 40 MG IJ SOLR
40.0000 mg | Freq: Two times a day (BID) | INTRAMUSCULAR | Status: DC
  Administered 2016-07-20 (×2): 40 mg via INTRAVENOUS
  Filled 2016-07-20 (×3): qty 1

## 2016-07-20 MED ORDER — POLYETHYLENE GLYCOL 3350 17 G PO PACK
17.0000 g | PACK | Freq: Every day | ORAL | Status: DC
  Filled 2016-07-20: qty 1

## 2016-07-20 MED ORDER — FUROSEMIDE 20 MG PO TABS: 20.0000 mg | ORAL_TABLET | Freq: Every day | ORAL | Status: DC

## 2016-07-20 MED ORDER — ESOMEPRAZOLE MAGNESIUM 40 MG PO CPDR
40.0000 mg | DELAYED_RELEASE_CAPSULE | Freq: Two times a day (BID) | ORAL | Status: DC
  Administered 2016-07-20 – 2016-07-21 (×3): 40 mg via ORAL
  Filled 2016-07-20 (×4): qty 1

## 2016-07-20 MED ORDER — FUROSEMIDE 40 MG PO TABS
40.0000 mg | ORAL_TABLET | Freq: Every day | ORAL | Status: DC
  Administered 2016-07-20 – 2016-07-21 (×2): 40 mg via ORAL
  Filled 2016-07-20 (×2): qty 1

## 2016-07-20 NOTE — Progress Notes (Signed)
Pt's BP 174/61 with macihine and 172/78 manually.  Pt asymptomatic.  PRN BP med is SBP rater than 175.  Will continue to monitor.  Paged Dr. Broadus John, awaiting return call.  Karie Kirks, Therapist, sports.

## 2016-07-20 NOTE — Progress Notes (Signed)
[facility]                                                                              Patient Demographics  [name], is a 66 y.o. female, DOB - [birth year], YQM:[id number]  Admit date - 07/17/2016   Admitting Physician Ivor Costa, MD  Outpatient Primary MD for the patient is [name], MD  Outpatient specialists: LOS - 3  days  Chief Complaint  Patient presents with  . Shortness of Breath      Brief summary   REMINGTYN [name] is a 66 y.o. female with medical history significant of asthma, hypertension, hyperlipidemia, type it is mellitus, GERD, depression, Slightly, CAD, non-STEMI (S A/P of stent placement 01/19/16), dCHF, CKD-III, PE, who presented with shortness of breath, cough, wheezing and chest pain. Patient was found to have influenza B positive Pt was seen by her pulmonologist, Dr. Melvyn Novas on the day of admission and was given prescription of Singulair, prednisone and azithromycin, but pt has not started taking these new meds yet.   Assessment & Plan   Acute respiratory failure without hypoxia due to Asthma exacerbation, triggered by influenza B: - Chest x-ray negative for infiltrates.  - Continue Tamiflu, still wheezing, will IV Solu-Medrol-cut down dose further, scheduled duonebs, brovana  - Continue Mucinex, Robitussin for cough  - ambulate, OOB  Pleuritic Chest pain: due to coughing from above. Pt has hx of PE and is currently on Estradiol which increases risk of PE. Also has CAD and NSTEMI, currently on ASA and brilinta, troponin negative - D-dimer was elevated 0.55, V/Q scan negtaive, dopplers negative - resolved  GERD -from steroids, increase PPI to BID for now  Hx of CAD and NSTEMI: -see above, troponin negative  DM-II: Last A1c 7.0 on 01/16/16, well controled. Patient is taking metformin at home -Continue sliding scale insulin - Follow hemoglobin A1c  Anxiety:  -continue Xanax  Chronic diastolic CHF : 2-D echo on 01/18/16 showed  EF 65-70 percent with grade 2 diastolic dysfunction. - CHF seems to be compensated on admission. - Currently Lasix on hold due to worsening renal function, resume tomorrow -continue ASA  HTN: -Continue Cardizem -hold lasix and diovan as above, iv hydralazine prn   G E R D: -pepcid    Acute on chronic kidney disease stage III: Baseline Cre is 1.18 on 03/23/16, pt's Cre is 1.62on admission. -  Likely due to prerenal secondary to dehydration and continuation of ARB, diruetics -  Improving, now 1.2  Code Status: full  DVT Prophylaxis:  Lovenox family Communication: Discussed in detail with the patient, all imaging results, lab results explained to the patient   Disposition Plan: Home in 1-2days  Time Spent in minutes  25 minutes  Procedures:    Consultants:     Antimicrobials:   Tamiflu   Medications  Scheduled Meds: . arformoterol  15 mcg Nebulization BID  . aspirin EC  81 mg Oral Daily  . benzonatate  100 mg Oral TID  . diltiazem  180 mg Oral Daily  . divalproex  500 mg Oral QHS  . enoxaparin (LOVENOX) injection  40 mg Subcutaneous Q24H  .  esomeprazole  40 mg Oral BID AC  . estradiol  1 mg Oral Daily  . famotidine  20 mg Oral QHS  . gabapentin  300 mg Oral QHS  . guaiFENesin  600 mg Oral BID  . insulin aspart  0-5 Units Subcutaneous QHS  . insulin aspart  0-9 Units Subcutaneous TID WC  . ipratropium-albuterol  3 mL Nebulization Q6H  . methylPREDNISolone (SOLU-MEDROL) injection  40 mg Intravenous Q12H  . montelukast  10 mg Oral QHS  . oseltamivir  30 mg Oral BID  . ticagrelor  90 mg Oral Q12H   Continuous Infusions:  PRN Meds:.acetaminophen, albuterol, ALPRAZolam, alum & mag hydroxide-simeth, fluticasone, guaiFENesin-codeine, hydrALAZINE, morphine injection, nitroGLYCERIN, ondansetron, sodium chloride, zolpidem   Antibiotics   Anti-infectives    Start     Dose/Rate Route Frequency Ordered Stop   07/18/16 1000  azithromycin (ZITHROMAX) tablet 250 mg   Status:  Discontinued     250 mg Oral Daily 07/17/16 1956 07/18/16 0346   07/18/16 1000  azithromycin (ZITHROMAX) tablet 250 mg  Status:  Discontinued     250 mg Oral Daily 07/18/16 0346 07/18/16 0349   07/18/16 0315  oseltamivir (TAMIFLU) capsule 30 mg     30 mg Oral 2 times daily 07/18/16 0300 07/22/16 2159   07/17/16 2000  azithromycin (ZITHROMAX) tablet 500 mg     500 mg Oral Daily 07/17/16 1956 07/17/16 2056        Subjective:   Still short of breathing but starting to improve, having more GERD symptoms   Objective:   Vitals:   07/20/16 1011 07/20/16 1111 07/20/16 1137 07/20/16 1138  BP: (!) 159/62 (!) 166/61 (!) 174/61 (!) 172/78  Pulse: 61 (!) 59 62   Resp:  18    Temp:  98.3 F (36.8 C)    TempSrc:  Oral    SpO2:  100%    Weight:      Height:        Intake/Output Summary (Last 24 hours) at 07/20/16 1150 Last data filed at 07/20/16 1010  Gross per 24 hour  Intake              680 ml  Output             1300 ml  Net             -620 ml     Wt Readings from Last 3 Encounters:  07/20/16 73.2 kg (161 lb 4.8 oz)  07/17/16 73.8 kg (162 lb 12.8 oz)  05/11/16 77.6 kg (171 lb)     Exam  General: Alert and oriented x 3, NAD  HEENT:  No JVD  Neck: Supple, no JVD, no masses  Cardiovascular: S1 S2 auscultated, no rubs, murmurs or gallops. Regular rate and rhythm.  Respiratory: Bilateral expiratory wheezing-improving  Gastrointestinal: Soft, nontender, nondistended, + bowel sounds  Ext: no cyanosis clubbing or edema  Neuro: AAOx3, Cr N's II- XII. Strength 5/5 upper and lower extremities bilaterally  Skin: No rashes  Psych: Normal affect and demeanor, alert and oriented x3    Data Reviewed:  I have personally reviewed following labs and imaging studies  Micro Results Recent Results (from the past 240 hour(s))  Culture, blood (routine x 2) Call MD if unable to obtain prior to antibiotics being given     Status: None (Preliminary result)   Collection  Time: 07/17/16  8:35 PM  Result Value Ref Range Status   Specimen Description BLOOD RIGHT ARM  Final  Special Requests BOTTLES DRAWN AEROBIC ONLY 10CC  Final   Culture NO GROWTH 3 DAYS  Final   Report Status PENDING  Incomplete  Culture, blood (routine x 2) Call MD if unable to obtain prior to antibiotics being given     Status: None (Preliminary result)   Collection Time: 07/17/16  8:47 PM  Result Value Ref Range Status   Specimen Description BLOOD LEFT ARM  Final   Special Requests IN PEDIATRIC BOTTLE 4CC  Final   Culture NO GROWTH 3 DAYS  Final   Report Status PENDING  Incomplete  Culture, sputum-assessment     Status: None   Collection Time: 07/19/16  1:16 AM  Result Value Ref Range Status   Specimen Description EXPECTORATED SPUTUM  Final   Special Requests NONE  Final   Sputum evaluation THIS SPECIMEN IS ACCEPTABLE FOR SPUTUM CULTURE  Final   Report Status 07/19/2016 FINAL  Final  Culture, respiratory (NON-Expectorated)     Status: None (Preliminary result)   Collection Time: 07/19/16  1:16 AM  Result Value Ref Range Status   Specimen Description EXPECTORATED SPUTUM  Final   Special Requests NONE Reflexed from [id number]  Final   Gram Stain   Final    FEW SQUAMOUS EPITHELIAL CELLS PRESENT FEW WBC PRESENT, PREDOMINANTLY MONONUCLEAR FEW GRAM POSITIVE COCCI IN PAIRS    Culture CULTURE REINCUBATED FOR BETTER GROWTH  Final   Report Status PENDING  Incomplete    Radiology Reports Nm Pulmonary Perf And Vent  Result Date: 07/18/2016 CLINICAL DATA:  Cough, shortness of breath, wheezing, chest pain EXAM: NUCLEAR MEDICINE VENTILATION - PERFUSION LUNG SCAN TECHNIQUE: Ventilation images were obtained in multiple projections using inhaled aerosol Tc-3m DTPA. Perfusion images were obtained in multiple projections after intravenous injection of Tc-66m MAA. RADIOPHARMACEUTICALS:  31.0 mCi Technetium-49m DTPA aerosol inhalation and 4.2 mCi Technetium-69m MAA IV COMPARISON:  Chest x-ray  07/17/2016 FINDINGS: Ventilation: No focal ventilation defect. Perfusion: No wedge shaped peripheral perfusion defects to suggest acute pulmonary embolism. IMPRESSION: No ventilation defects are noted.No wedge shaped peripheral perfusion defects to suggest acute pulmonary embolism. Electronically Signed   By: Lahoma Crocker M.D.   On: 07/18/2016 12:30   Dg Chest Portable 1 View  Result Date: 07/17/2016 CLINICAL DATA:  Shortness of breath and wheezing EXAM: PORTABLE CHEST 1 VIEW COMPARISON:  03/23/2016 FINDINGS: The heart size and mediastinal contours are within normal limits. Both lungs are clear. The visualized skeletal structures are unremarkable. IMPRESSION: No active disease. Electronically Signed   By: Inez Catalina M.D.   On: 07/17/2016 18:36    Lab Data:  CBC:  Recent Labs Lab 07/17/16 1828 07/18/16 0655 07/19/16 0540 07/20/16 0453  WBC 4.3 4.9 14.5* 12.4*  NEUTROABS 2.0  --   --   --   HGB 10.6* 10.5* 11.0* 10.4*  HCT 33.2* 32.7* 33.7* 31.5*  MCV 94.9 94.5 94.7 94.9  PLT 279 286 348 008   Basic Metabolic Panel:  Recent Labs Lab 07/17/16 1828 07/19/16 0540 07/20/16 0453  NA 136 135 137  K 3.6 4.5 4.4  CL 99* 100* 100*  CO2 26 17* 24  GLUCOSE 111* 277* 211*  BUN 24* 20 22*  CREATININE 1.62* 1.27* 1.01*  CALCIUM 8.8* 9.0 9.1   GFR: Estimated Creatinine Clearance: 51.3 mL/min (A) (by C-G formula based on SCr of 1.01 mg/dL (H)). Liver Function Tests: No results for input(s): AST, ALT, ALKPHOS, BILITOT, PROT, ALBUMIN in the last 168 hours. No results for input(s): LIPASE, AMYLASE in the last  168 hours. No results for input(s): AMMONIA in the last 168 hours. Coagulation Profile:  Recent Labs Lab 07/17/16 2209  INR 0.97   Cardiac Enzymes:  Recent Labs Lab 07/17/16 2042 07/18/16 0142 07/18/16 0655  TROPONINI <0.03 <0.03 <0.03   BNP (last 3 results)  Recent Labs  02/23/16 1122  PROBNP 13.0   HbA1C:  Recent Labs  07/18/16 0352  HGBA1C 6.6*    CBG:  Recent Labs Lab 07/19/16 1124 07/19/16 1613 07/19/16 2108 07/20/16 0757 07/20/16 1117  GLUCAP 201* 207* 226* 202* 195*   Lipid Profile:  Recent Labs  07/18/16 0142  CHOL 196  HDL 48  LDLCALC 107*  TRIG 203*  CHOLHDL 4.1   Thyroid Function Tests: No results for input(s): TSH, T4TOTAL, FREET4, T3FREE, THYROIDAB in the last 72 hours. Anemia Panel: No results for input(s): VITAMINB12, FOLATE, FERRITIN, TIBC, IRON, RETICCTPCT in the last 72 hours. Urine analysis:    Component Value Date/Time   COLORURINE YELLOW 08/07/2014 1304   APPEARANCEUR CLEAR 08/07/2014 1304   LABSPEC 1.012 08/07/2014 1304   PHURINE 5.0 08/07/2014 1304   GLUCOSEU NEGATIVE 08/07/2014 1304   HGBUR NEGATIVE 08/07/2014 1304   BILIRUBINUR NEGATIVE 08/07/2014 1304   KETONESUR NEGATIVE 08/07/2014 1304   PROTEINUR NEGATIVE 08/07/2014 1304   UROBILINOGEN 0.2 08/07/2014 1304   NITRITE NEGATIVE 08/07/2014 1304   LEUKOCYTESUR NEGATIVE 08/07/2014 [place] M.D. [facility] 07/20/2016, 11:50 AM  Pager: [phone]  After 7pm go to [email or web address] - password [email or web address]  Call night coverage person covering after 7pm

## 2016-07-20 NOTE — Progress Notes (Signed)
Pt had a one time complain of heart burn kind of pain overnight, Maalox given, besides that Pt slept well overnight ,Vitals stable, no any complain of SOB and pain, is in moderate fall risk, aware of safety precaution, will continue to monitor the patient   Palma Holter, RN

## 2016-07-20 NOTE — Progress Notes (Signed)
Pt requesting to speak with case manager about her status as observation.  Spoke with Hassan Rowan, CM instructed that she was admitted as inpt.  Notified pt.  Verbalized understanding.  Pt requesting to speak with CM for further information she need to know about.  Karie Kirks, Therapist, sports.

## 2016-07-20 NOTE — Progress Notes (Signed)
Dr Broadus John returned call and instructed she will put in order for lasix and mirilax for pt.  Karie Kirks, Therapist, sports.

## 2016-07-20 NOTE — Progress Notes (Signed)
No f/u call received.  Notified Dr. Broadus John of BP and pt requesting laxative as not BM since Monday.  Pt made aware.  Karie Kirks, RN

## 2016-07-21 LAB — BASIC METABOLIC PANEL
Anion gap: 10 (ref 5–15)
BUN: 22 mg/dL — ABNORMAL HIGH (ref 6–20)
CHLORIDE: 97 mmol/L — AB (ref 101–111)
CO2: 27 mmol/L (ref 22–32)
CREATININE: 1 mg/dL (ref 0.44–1.00)
Calcium: 8.4 mg/dL — ABNORMAL LOW (ref 8.9–10.3)
GFR calc non Af Amer: 57 mL/min — ABNORMAL LOW (ref >60)
GLUCOSE: 270 mg/dL — AB (ref 65–99)
Potassium: 4.2 mmol/L (ref 3.5–5.1)
Sodium: 134 mmol/L — ABNORMAL LOW (ref 135–145)

## 2016-07-21 LAB — CULTURE, RESPIRATORY W GRAM STAIN: Culture: NORMAL

## 2016-07-21 LAB — GLUCOSE, CAPILLARY
Glucose-Capillary: 243 mg/dL — ABNORMAL HIGH (ref 65–99)
Glucose-Capillary: 202 mg/dL — ABNORMAL HIGH (ref 65–99)

## 2016-07-21 LAB — CULTURE, RESPIRATORY

## 2016-07-21 MED ORDER — PREDNISONE 50 MG PO TABS
50.0000 mg | ORAL_TABLET | Freq: Every day | ORAL | Status: DC
  Administered 2016-07-21: 50 mg via ORAL
  Filled 2016-07-21: qty 1

## 2016-07-21 MED ORDER — OSELTAMIVIR PHOSPHATE 30 MG PO CAPS: 30.0000 mg | ORAL_CAPSULE | Freq: Two times a day (BID) | ORAL | 0 refills | Status: DC

## 2016-07-21 MED ORDER — ESOMEPRAZOLE MAGNESIUM 40 MG PO CPDR: 40.0000 mg | DELAYED_RELEASE_CAPSULE | Freq: Two times a day (BID) | ORAL | Status: DC

## 2016-07-21 NOTE — Progress Notes (Signed)
At 1414 pt d/c to home after all d/c instructions explained and given to pt.  Verbalized understanding.  D/c off floor via w/c to awaiting transport.  Karie Kirks, Therapist, sports.

## 2016-07-21 NOTE — Progress Notes (Signed)
Notified RN of pt HR of 52 and BP of 194/80

## 2016-07-21 NOTE — Discharge Summary (Signed)
Physician Discharge Summary  [name] MIW:[id number] DOB: 10/12/1950 DOA: 07/17/2016  PCP: Jacqueline Frees, MD  Admit date: 07/17/2016 Discharge date: 07/21/2016  Time spent: 35 minutes  Recommendations for Outpatient Follow-up:  1. PCP in 1week  Discharge Diagnoses:    Asthma exacerbation   Influenza B   Diabetes mellitus type 2, uncontrolled ([place])   Depression with anxiety   Essential hypertension   G E R D   PULMONARY EMBOLISM, HX OF   Chest pain   Acute respiratory failure (HCC)   CAD in native artery   HLD (hyperlipidemia)   Chronic diastolic CHF (congestive heart failure) (HCC)   Acute renal failure superimposed on stage 3 chronic kidney disease ([place])   Discharge Condition: stable  Diet recommendation: carb modified heart heathy  Filed Weights   07/19/16 0521 07/20/16 0534 07/21/16 0636  Weight: 73 kg (161 lb) 73.2 kg (161 lb 4.8 oz) 72.5 kg (159 lb 12.8 oz)    History of present illness:  [name] a 66 y.o.femalewith medical history significant of asthma, hypertension, hyperlipidemia, type it is mellitus, GERD, depression, Slightly, CAD, non-STEMI (S A/P of stent placement 01/19/16), dCHF, CKD-III, PE, whopresented with shortness of breath, cough, wheezing and chest pain.Pt was seen byher pulmonologist, [name] on the day of admission and was given prescription of Singulair, prednisoneand azithromycin, but pt has not started taking these new meds yet.  Hospital Course:   Acute respiratory failure without hypoxia due to Asthma exacerbation, triggered by influenza B: - Chest x-ray negative for infiltrates.  - Improved slowly with IV steroids, Tamiflu, scheduled duonebs, brovana  - discharged home on prednisone taper and tamiflu for 2 more days  Pleuritic Chest pain: due to coughing from above.  - h/o CAD and NSTEMI, currently on ASA and brilinta, troponin negative - D-dimer was elevated 0.55, V/Q scan negtaive, dopplers negative -  resolved  GERD -from steroids, increase PPI to BID for 3-4days, then change back to daily  Hx of CAD and NSTEMI: -see above, troponin negative  DM-II:Last A1c 7.0 on 01/16/16, now 6.6 -resumed metformin   Anxiety:  -continue Xanax  Chronic diastolic CHF :2-D echo on 01/18/16 showed EF 65-70 percent with grade 2 diastolic dysfunction.  - CHF seems to be compensated on admission. - lasix was held initially, then resumed yesterday, continue ASA  HTN: -Continue Cardizem, resumed lasix and ARB  G E R D: -pepcid  Acute on chronic kidney disease stage III: Baseline Creatinine is 1.18 on 03/23/16, creatinine 1.62 on admission. -  Likely due to prerenal secondary to dehydration and continuation of ARB, diuretics -  Improved with hydration to 1.0  Discharge Exam: Vitals:   07/21/16 1124 07/21/16 1300  BP: (!) 194/80 (!) 156/65  Pulse: (!) 52 66  Resp: 18   Temp: 97.9 F (36.6 C)     General: AAOx3 Cardiovascular: S1S2/RRR Respiratory: improved air movement, rare wheezes  Discharge Instructions   Discharge Instructions    Diet - low sodium heart healthy    Complete by:  As directed    Diet Carb Modified    Complete by:  As directed    Increase activity slowly    Complete by:  As directed      Current Discharge Medication List    START taking these medications   Details  oseltamivir (TAMIFLU) 30 MG capsule Take 1 capsule (30 mg total) by mouth 2 (two) times daily. For 2days Qty: 3 capsule, Refills: 0  CONTINUE these medications which have CHANGED   Details  esomeprazole (NEXIUM) 40 MG capsule Take 1 capsule (40 mg total) by mouth 2 (two) times daily before a meal. Take twice a day for 4days then resume once daily      CONTINUE these medications which have NOT CHANGED   Details  acetaminophen-codeine (TYLENOL #3) 300-30 MG tablet TAKE ONE TABLET BY MOUTH EVERY 4 HOURS AS NEEDED FOR COUGH Qty: 40 tablet, Refills: 0   Associated Diagnoses: Upper  airway cough syndrome    albuterol (PROVENTIL HFA;VENTOLIN HFA) 108 (90 Base) MCG/ACT inhaler Inhale 2 puffs into the lungs every 6 (six) hours as needed for wheezing or shortness of breath. Qty: 1 Inhaler, Refills: 5    albuterol (PROVENTIL) (2.5 MG/3ML) 0.083% nebulizer solution Take 3 mLs (2.5 mg total) by nebulization every 4 (four) hours as needed for shortness of breath. Qty: 500 mL, Refills: 1    ALPRAZolam (XANAX) 0.5 MG tablet Take 1 tablet (0.5 mg total) by mouth 2 (two) times daily as needed for anxiety. Qty: 30 tablet, Refills: 0    aspirin EC 81 MG tablet Take 81 mg by mouth daily.     dextromethorphan (DELSYM) 30 MG/5ML liquid Take 90 mg by mouth 2 (two) times daily as needed for cough.     diltiazem (DILACOR XR) 180 MG 24 hr capsule Take 180 mg by mouth daily.    divalproex (DEPAKOTE ER) 500 MG 24 hr tablet Take 1 tablet (500 mg total) by mouth at bedtime. Qty: 30 tablet, Refills: 11    estradiol (ESTRACE) 1 MG tablet Take 1 mg by mouth daily.     famotidine (PEPCID) 20 MG tablet Take 20 mg by mouth at bedtime.     fluticasone (FLONASE) 50 MCG/ACT nasal spray Place 2 sprays into both nostrils daily as needed for allergies or rhinitis.    furosemide (LASIX) 40 MG tablet Take 1 tablet (40 mg total) by mouth daily. Qty: 90 tablet, Refills: 3    gabapentin (NEURONTIN) 300 MG capsule Take 300 mg by mouth at bedtime.    metFORMIN (GLUCOPHAGE) 500 MG tablet Take 1 tablet (500 mg total) by mouth 2 (two) times daily with a meal. Qty: 60 tablet, Refills: 0    mometasone-formoterol (DULERA) 200-5 MCG/ACT AERO Inhale 2 puffs into the lungs 2 (two) times daily. Qty: 1 Inhaler, Refills: 0    nebivolol (BYSTOLIC) 5 MG tablet Take 5 mg by mouth daily.    potassium chloride SA (K-DUR,KLOR-CON) 20 MEQ tablet Take 20 mEq by mouth daily.     sodium chloride (OCEAN) 0.65 % SOLN nasal spray Place 1 spray into both nostrils daily as needed for congestion.     ticagrelor (BRILINTA)  90 MG TABS tablet Take 1 tablet (90 mg total) by mouth every 12 (twelve) hours. Qty: 180 tablet, Refills: 3    valsartan (DIOVAN) 320 MG tablet Take 1 tablet (320 mg total) by mouth daily. Qty: 90 tablet, Refills: 3    nitroGLYCERIN (NITROSTAT) 0.4 MG SL tablet Place 0.4 mg under the tongue every 5 (five) minutes as needed for chest pain. x3 as needed    predniSONE (DELTASONE) 10 MG tablet Take  4 each am x 2 days,   2 each am x 2 days,  1 each am x 2 days and stop Qty: 14 tablet, Refills: 0   Associated Diagnoses: Mild persistent asthma with exacerbation    Respiratory Therapy Supplies (FLUTTER) DEVI Use as directed Qty: 1 each, Refills: 0  STOP taking these medications     azithromycin (ZITHROMAX) 250 MG tablet        Allergies  Allergen Reactions  . Amlodipine Besylate Other (See Comments)    Reaction unknown  . Lisinopril Other (See Comments)    Reaction unknown  . Pantoprazole Sodium Other (See Comments)    Reaction unknown   Follow-up Information    Jacqueline Frees, MD On 07/25/2016.   Specialty:  Family Medicine Why:  AT 2:00PM [place] F/U APPT Contact information: [address] [place] [address] [phone]            The results of significant diagnostics from this hospitalization (including imaging, microbiology, ancillary and laboratory) are listed below for reference.    Significant Diagnostic Studies: Nm Pulmonary Perf And Vent  Result Date: 07/18/2016 CLINICAL DATA:  Cough, shortness of breath, wheezing, chest pain EXAM: NUCLEAR MEDICINE VENTILATION - PERFUSION LUNG SCAN TECHNIQUE: Ventilation images were obtained in multiple projections using inhaled aerosol Tc-68m DTPA. Perfusion images were obtained in multiple projections after intravenous injection of Tc-14m MAA. RADIOPHARMACEUTICALS:  31.0 mCi Technetium-73m DTPA aerosol inhalation and 4.2 mCi Technetium-75m MAA IV COMPARISON:  Chest x-ray 07/17/2016 FINDINGS:  Ventilation: No focal ventilation defect. Perfusion: No wedge shaped peripheral perfusion defects to suggest acute pulmonary embolism. IMPRESSION: No ventilation defects are noted.No wedge shaped peripheral perfusion defects to suggest acute pulmonary embolism. Electronically Signed   By: Lahoma Crocker M.D.   On: 07/18/2016 12:30   Dg Chest Portable 1 View  Result Date: 07/17/2016 CLINICAL DATA:  Shortness of breath and wheezing EXAM: PORTABLE CHEST 1 VIEW COMPARISON:  03/23/2016 FINDINGS: The heart size and mediastinal contours are within normal limits. Both lungs are clear. The visualized skeletal structures are unremarkable. IMPRESSION: No active disease. Electronically Signed   By: Inez Catalina M.D.   On: 07/17/2016 18:36    Microbiology: Recent Results (from the past 240 hour(s))  Culture, blood (routine x 2) Call MD if unable to obtain prior to antibiotics being given     Status: None (Preliminary result)   Collection Time: 07/17/16  8:35 PM  Result Value Ref Range Status   Specimen Description BLOOD RIGHT ARM  Final   Special Requests BOTTLES DRAWN AEROBIC ONLY 10CC  Final   Culture NO GROWTH 4 DAYS  Final   Report Status PENDING  Incomplete  Culture, blood (routine x 2) Call MD if unable to obtain prior to antibiotics being given     Status: None (Preliminary result)   Collection Time: 07/17/16  8:47 PM  Result Value Ref Range Status   Specimen Description BLOOD LEFT ARM  Final   Special Requests IN PEDIATRIC BOTTLE 4CC  Final   Culture NO GROWTH 4 DAYS  Final   Report Status PENDING  Incomplete  Culture, sputum-assessment     Status: None   Collection Time: 07/19/16  1:16 AM  Result Value Ref Range Status   Specimen Description EXPECTORATED SPUTUM  Final   Special Requests NONE  Final   Sputum evaluation THIS SPECIMEN IS ACCEPTABLE FOR SPUTUM CULTURE  Final   Report Status 07/19/2016 FINAL  Final  Culture, respiratory (NON-Expectorated)     Status: None   Collection Time:  07/19/16  1:16 AM  Result Value Ref Range Status   Specimen Description EXPECTORATED SPUTUM  Final   Special Requests NONE Reflexed from [id number]  Final   Gram Stain   Final    FEW SQUAMOUS EPITHELIAL CELLS PRESENT FEW WBC  PRESENT, PREDOMINANTLY MONONUCLEAR FEW GRAM POSITIVE COCCI IN PAIRS    Culture Consistent with normal respiratory flora.  Final   Report Status 07/21/2016 FINAL  Final     Labs: Basic Metabolic Panel:  Recent Labs Lab 07/17/16 1828 07/19/16 0540 07/20/16 0453 07/21/16 0305  NA 136 135 137 134*  K 3.6 4.5 4.4 4.2  CL 99* 100* 100* 97*  CO2 26 17* 24 27  GLUCOSE 111* 277* 211* 270*  BUN 24* 20 22* 22*  CREATININE 1.62* 1.27* 1.01* 1.00  CALCIUM 8.8* 9.0 9.1 8.4*   Liver Function Tests: No results for input(s): AST, ALT, ALKPHOS, BILITOT, PROT, ALBUMIN in the last 168 hours. No results for input(s): LIPASE, AMYLASE in the last 168 hours. No results for input(s): AMMONIA in the last 168 hours. CBC:  Recent Labs Lab 07/17/16 1828 07/18/16 0655 07/19/16 0540 07/20/16 0453  WBC 4.3 4.9 14.5* 12.4*  NEUTROABS 2.0  --   --   --   HGB 10.6* 10.5* 11.0* 10.4*  HCT 33.2* 32.7* 33.7* 31.5*  MCV 94.9 94.5 94.7 94.9  PLT 279 286 348 289   Cardiac Enzymes:  Recent Labs Lab 07/17/16 2042 07/18/16 0142 07/18/16 0655  TROPONINI <0.03 <0.03 <0.03   BNP: BNP (last 3 results)  Recent Labs  01/26/16 1608 02/27/16 1107 07/18/16 0352  BNP <4.0 44.0 9.2    ProBNP (last 3 results)  Recent Labs  02/23/16 1122  PROBNP 13.0    CBG:  Recent Labs Lab 07/20/16 1117 07/20/16 1603 07/20/16 2117 07/21/16 0746 07/21/16 1128  GLUCAP 195* 266* 242* 243* 202*       SignedDomenic Polite MD.  [facility] 07/21/2016, 1:51 PM

## 2016-07-21 NOTE — Evaluation (Signed)
Physical Therapy Evaluation Patient Details Name: ALLYSSON RINEHIMER MRN: [id number] DOB: 1951-02-06 Today's Date: 07/21/2016   History of Present Illness   66 y.o. female with medical history significant of asthma, hypertension, hyperlipidemia, type it is mellitus, GERD, depression, Slightly, CAD, non-STEMI (S A/P of stent placement 01/19/16), dCHF, CKD-III, PE, who presents with shortness of breath, cough, wheezing and chest pain. Dx of flu, asthma exacerbation.  Clinical Impression  Pt is independent with mobility, she ambulated 280' without assistive device, no loss of balance, SaO2 100% on RA and HR 77 max with walking, no dyspnea. From PT standpoint she is ready to DC home, no further PT indicated, will sign off.     Follow Up Recommendations No PT follow up    Equipment Recommendations  None recommended by PT    Recommendations for Other Services       Precautions / Restrictions Precautions Precautions: Other (comment) Precaution Comments: droplet; pt denies h/o falls in past 1 year Restrictions Weight Bearing Restrictions: No      Mobility  Bed Mobility               General bed mobility comments: up in recliner  Transfers Overall transfer level: Independent                  Ambulation/Gait Ambulation/Gait assistance: Independent Ambulation Distance (Feet): 280 Feet Assistive device: None Gait Pattern/deviations: WFL(Within Functional Limits)   Gait velocity interpretation: at or above normal speed for age/gender General Gait Details: steady, no LOB, HR 77, SaO2 100% on RA walking  Stairs            Wheelchair Mobility    Modified Rankin (Stroke Patients Only)       Balance Overall balance assessment: Independent                                           Pertinent Vitals/Pain Pain Assessment: No/denies pain    Home Living Family/patient expects to be discharged to:: Private residence Living Arrangements:  Spouse/significant other Available Help at Discharge: Family;Available 24 hours/day Type of Home: House       Home Layout: One level Home Equipment: None      Prior Function Level of Independence: Independent         Comments: drives, works at [employer]        Extremity/Trunk Assessment   Upper Extremity Assessment Upper Extremity Assessment: Overall WFL for tasks assessed    Lower Extremity Assessment Lower Extremity Assessment: Overall WFL for tasks assessed    Cervical / Trunk Assessment Cervical / Trunk Assessment: Normal  Communication   Communication: No difficulties  Cognition Arousal/Alertness: Awake/alert Behavior During Therapy: WFL for tasks assessed/performed Overall Cognitive Status: Within Functional Limits for tasks assessed                                        General Comments      Exercises     Assessment/Plan    PT Assessment Patent does not need any further PT services  PT Problem List         PT Treatment Interventions      PT Goals (Current goals can be found in the Care Plan section)  Acute Rehab PT  Goals Patient Stated Goal: return to work PT Goal Formulation: All assessment and education complete, DC therapy    Frequency     Barriers to discharge        Co-evaluation               End of Session Equipment Utilized During Treatment: Gait belt Activity Tolerance: Patient tolerated treatment well Patient left: in chair Nurse Communication: Mobility status      Time: 3794-4461 PT Time Calculation (min) (ACUTE ONLY): 14 min   Charges:   PT Evaluation $PT Eval Low Complexity: 1 Procedure     PT G Codes:          Philomena Doheny 07/21/2016, 10:31 AM [phone]

## 2016-07-21 NOTE — Progress Notes (Signed)
Results for SHRIKA, MILOS (MRN [id number]) as of 07/21/2016 10:20  Ref. Range 07/20/2016 07:57 07/20/2016 11:17 07/20/2016 16:03 07/20/2016 21:17 07/21/2016 07:46  Glucose-Capillary Latest Ref Range: 65 - 99 mg/dL 202 (H) 195 (H) 266 (H) 242 (H) 243 (H)  Noted that blood sugars are greater than 180 mg/dl.  Recommend increasing Novolog to MODERATE TID & HS if blood sugars continue to be elevated. Harvel Ricks RN BSN CDE

## 2016-07-21 NOTE — Progress Notes (Signed)
BP 174/68 with machine and 172/62 manually.  Dr. Broadus John made aware,  Pt asymptomatic.  Will conitiue to monitor.  Kaydince Towles [employer].

## 2016-07-21 NOTE — Progress Notes (Signed)
Notified RN of HR 57 and BP of 174/68

## 2016-07-22 LAB — CULTURE, BLOOD (ROUTINE X 2)
CULTURE: NO GROWTH
Culture: NO GROWTH

## 2016-07-26 NOTE — Telephone Encounter (Signed)
Patient reports she has been incident free for six months and she is taking her medication as prescribed.  According to [place] law, she will be able to resume driving.

## 2016-07-26 NOTE — Telephone Encounter (Signed)
Patient called office in reference to being able to drive again.  Today marks 6 months.  Please call

## 2016-08-01 ENCOUNTER — Encounter: Payer: Self-pay | Admitting: Adult Health

## 2016-08-01 ENCOUNTER — Ambulatory Visit (INDEPENDENT_AMBULATORY_CARE_PROVIDER_SITE_OTHER): Payer: Medicare Other | Admitting: Adult Health

## 2016-08-01 VITALS — BP 136/70 | HR 67 | Ht 62.0 in | Wt 162.0 lb

## 2016-08-01 DIAGNOSIS — R0602 Shortness of breath: Secondary | ICD-10-CM

## 2016-08-01 DIAGNOSIS — R911 Solitary pulmonary nodule: Secondary | ICD-10-CM | POA: Diagnosis not present

## 2016-08-01 DIAGNOSIS — J4541 Moderate persistent asthma with (acute) exacerbation: Secondary | ICD-10-CM

## 2016-08-01 DIAGNOSIS — R05 Cough: Secondary | ICD-10-CM | POA: Diagnosis not present

## 2016-08-01 DIAGNOSIS — J4530 Mild persistent asthma, uncomplicated: Secondary | ICD-10-CM

## 2016-08-01 DIAGNOSIS — J45901 Unspecified asthma with (acute) exacerbation: Secondary | ICD-10-CM

## 2016-08-01 DIAGNOSIS — R058 Other specified cough: Secondary | ICD-10-CM

## 2016-08-01 MED ORDER — ALBUTEROL SULFATE HFA 108 (90 BASE) MCG/ACT IN AERS: 2.0000 | INHALATION_SPRAY | RESPIRATORY_TRACT | 5 refills | Status: DC | PRN

## 2016-08-01 MED ORDER — TIOTROPIUM BROMIDE MONOHYDRATE 2.5 MCG/ACT IN AERS: 2.0000 | INHALATION_SPRAY | Freq: Every day | RESPIRATORY_TRACT | 0 refills | Status: DC

## 2016-08-01 MED ORDER — TIOTROPIUM BROMIDE MONOHYDRATE 2.5 MCG/ACT IN AERS: 2.0000 | INHALATION_SPRAY | Freq: Every day | RESPIRATORY_TRACT | 5 refills | Status: DC

## 2016-08-01 NOTE — Assessment & Plan Note (Signed)
Improving , will stop Gabapentin (does not like the side effects)  Patient's medications were reviewed today and patient education was given. Computerized medication calendar was adjusted/completed

## 2016-08-01 NOTE — Progress Notes (Signed)
Patient seen in the office today and instructed on use of Spiriva Resp 2.59mcg.  Patient expressed understanding and demonstrated technique. Parke Poisson, CMA 08/01/16

## 2016-08-01 NOTE — Patient Instructions (Addendum)
May stop Gabapentin .  Add Spiriva 2 puff  daily .  Continue Dulera 2 puffs Twice daily  , rinse after use.  Follow med calendar closely and bring to each visit  Follow up Dr. Melvyn Novas  In 6- weeks and As needed   Please contact office for sooner follow up if symptoms do not improve or worsen or seek emergency care  Set up HRCT Chest .

## 2016-08-01 NOTE — Assessment & Plan Note (Signed)
Recurrent exacerbations - advised on trigger control  Frequent hospitalizations -  Patient's medications were reviewed today and patient education was given. Computerized medication calendar was adjusted/completed  Add spiriva to see if this helps No sign obstruction on Spirometry , only restriction .  Previous CT sinus neg.   Plan  Patient Instructions  May stop Gabapentin .  Add Spiriva 2 puff  daily .  Continue Dulera 2 puffs Twice daily  , rinse after use.  Follow med calendar closely and bring to each visit  Follow up Dr. Melvyn Novas  In 6- weeks and As needed   Please contact office for sooner follow up if symptoms do not improve or worsen or seek emergency care  Set up HRCT Chest .

## 2016-08-01 NOTE — Assessment & Plan Note (Signed)
Noted 38mm RUL nodule on CT chest in 06/2015  Repeat CT chest -former smoker .

## 2016-08-01 NOTE — Addendum Note (Signed)
Addended by: Parke Poisson E on: 08/01/2016 12:10 PM   Modules accepted: Orders

## 2016-08-01 NOTE — Progress Notes (Signed)
@Patient  ID: [name], female    DOB: 09-03-1950, 66 y.o.   MRN: [id number]  Chief Complaint  Patient presents with  . Follow-up    asthma     Referring provider: [name], MD  HPI: 35 yobf former smoker followed for Asthma   TEST  FEN0 01/24/2016= 13 with active symptoms on symb 160 2bid >changed to dulera 100  - Spirometry 02/23/2016 NO signifiant obstruction with active symptoms  - FENO 02/23/2016= 12 On dulera 100 2bid (unable to confirm adeherence as did not bring it - Singulair 10 mg daily added 10/25/2017to dulera 100 2bid with 2 week sample only (unable to verify she used it) - FENO 04/07/2016= 43 -CTa CHest 2017  >neg PE . 4 mm RUL nodule  CT sinus 2016 neg   08/01/2016 Follow up : [facility] follow up - Asthma flare  Patient presents for a post hospital follow-up. Patient was admitted last month for an asthma exacerbation. She had influenza B. Was treated with IV steroids, and Tamiflu.. Since discharge. Patient is feeling much better.  Decreased Dyspnea and wheezing .  Cough is resolved.  Over last 6 months with 3 hospital admission and 1 ER visit. We discussed adding new inhaler to her regimen  Reviewed her med and organized them with pt education. Appears to be taking correctly  Was on Gabapentin to help with cough but does not like how it makes her feel. Feels cough is gone for now.  FENO today is 10 .      Allergies  Allergen Reactions  . Amlodipine Besylate Other (See Comments)    Reaction unknown  . Lisinopril Other (See Comments)    Reaction unknown  . Pantoprazole Sodium Other (See Comments)    Reaction unknown    Immunization History  Administered Date(s) Administered  . Influenza Split 03/10/2012, 03/14/2015  . Influenza-Unspecified 01/11/2016    Past Medical History:  Diagnosis Date  . Anxiety   . Asthma   . Bronchitis   . Chronic diastolic CHF (congestive heart failure) ([place])   . Chronic diastolic CHF  (congestive heart failure) ([place]) 07/17/2016  . Coronary artery disease    a. NSTEMI 12/2015 - LHC 01/18/16: s/p overlapping DESx2 to RCA, 10% ost-prox Cx,10% mLAD.  Marland Kitchen Depression   . Diabetes mellitus   . Dyslipidemia   . GERD (gastroesophageal reflux disease)   . Heart attack   . Hypertension   . Hypertensive heart disease   . NSTEMI (non-ST elevated myocardial infarction) ([place]) 12/2015  . Pneumonia     Tobacco History: History  Smoking Status  . Former Smoker  . Packs/day: 1.00  . Years: 30.00  . Types: Cigarettes  . Quit date: 01/09/2004  Smokeless Tobacco  . Never Used   Counseling given: Not Answered   Outpatient Encounter Prescriptions as of 08/01/2016  Medication Sig  . acetaminophen-codeine (TYLENOL #3) 300-30 MG tablet TAKE ONE TABLET BY MOUTH EVERY 4 HOURS AS NEEDED FOR COUGH (Patient taking differently: Take 1 tablet by mouth every 4 (four) hours as needed (cough). )  . albuterol (PROVENTIL HFA;VENTOLIN HFA) 108 (90 Base) MCG/ACT inhaler Inhale 2 puffs into the lungs every 6 (six) hours as needed for wheezing or shortness of breath.  Marland Kitchen albuterol (PROVENTIL) (2.5 MG/3ML) 0.083% nebulizer solution Take 3 mLs (2.5 mg total) by nebulization every 4 (four) hours as needed for shortness of breath. (Patient taking differently: Take 2.5 mg by nebulization every 4 (four) hours as needed for wheezing or shortness  of breath. )  . ALPRAZolam (XANAX) 0.5 MG tablet Take 1 tablet (0.5 mg total) by mouth 2 (two) times daily as needed for anxiety.  Marland Kitchen aspirin EC 81 MG tablet Take 81 mg by mouth daily.   Marland Kitchen atorvastatin (LIPITOR) 40 MG tablet Take 40 mg by mouth daily.  Marland Kitchen dextromethorphan (DELSYM) 30 MG/5ML liquid Take 90 mg by mouth 2 (two) times daily as needed for cough.   . diltiazem (DILACOR XR) 180 MG 24 hr capsule Take 180 mg by mouth daily.  . divalproex (DEPAKOTE ER) 500 MG 24 hr tablet Take 1 tablet (500 mg total) by mouth at bedtime.  Marland Kitchen esomeprazole (NEXIUM) 40 MG capsule Take 1  capsule (40 mg total) by mouth 2 (two) times daily before a meal. Take twice a day for 4days then resume once daily  . estradiol (ESTRACE) 1 MG tablet Take 1 mg by mouth daily.   . famotidine (PEPCID) 20 MG tablet Take 20 mg by mouth at bedtime.   . fluticasone (FLONASE) 50 MCG/ACT nasal spray Place 2 sprays into both nostrils daily as needed for allergies or rhinitis.  Marland Kitchen gabapentin (NEURONTIN) 300 MG capsule Take 300 mg by mouth at bedtime.  . metFORMIN (GLUCOPHAGE) 500 MG tablet Take 1 tablet (500 mg total) by mouth 2 (two) times daily with a meal.  . mometasone-formoterol (DULERA) 200-5 MCG/ACT AERO Inhale 2 puffs into the lungs 2 (two) times daily.  . nebivolol (BYSTOLIC) 5 MG tablet Take 5 mg by mouth daily.  . nitroGLYCERIN (NITROSTAT) 0.4 MG SL tablet Place 0.4 mg under the tongue every 5 (five) minutes as needed for chest pain. x3 as needed  . potassium chloride SA (K-DUR,KLOR-CON) 20 MEQ tablet Take 20 mEq by mouth daily.   Marland Kitchen Respiratory Therapy Supplies (FLUTTER) DEVI Use as directed  . sodium chloride (OCEAN) 0.65 % SOLN nasal spray Place 1 spray into both nostrils daily as needed for congestion.   . ticagrelor (BRILINTA) 90 MG TABS tablet Take 1 tablet (90 mg total) by mouth every 12 (twelve) hours.  . valsartan (DIOVAN) 320 MG tablet Take 1 tablet (320 mg total) by mouth daily.  . [DISCONTINUED] oseltamivir (TAMIFLU) 30 MG capsule Take 1 capsule (30 mg total) by mouth 2 (two) times daily. For 2days  . [DISCONTINUED] predniSONE (DELTASONE) 10 MG tablet Take  4 each am x 2 days,   2 each am x 2 days,  1 each am x 2 days and stop (Patient taking differently: Take 10-40 mg by mouth See admin instructions. Take 4 tablets (40 mg) by mouth daily for 2 days, then take 2 tablets (20 mg) daily for 2 days, then take 1 tablet (10 mg) daily for 2 days, then stop)  . furosemide (LASIX) 40 MG tablet Take 1 tablet (40 mg total) by mouth daily.   No facility-administered encounter medications on file  as of 08/01/2016.      Review of Systems  Constitutional:   No  weight loss, night sweats,  Fevers, chills, fatigue, or  lassitude.  HEENT:   No headaches,  Difficulty swallowing,  Tooth/dental problems, or  Sore throat,                No sneezing, itching, ear ache, nasal congestion, post nasal drip,   CV:  No chest pain,  Orthopnea, PND, swelling in lower extremities, anasarca, dizziness, palpitations, syncope.   GI  No heartburn, indigestion, abdominal pain, nausea, vomiting, diarrhea, change in bowel habits, loss of appetite, bloody  stools.   Resp:    No chest wall deformity  Skin: no rash or lesions.  GU: no dysuria, change in color of urine, no urgency or frequency.  No flank pain, no hematuria   MS:  No joint pain or swelling.  No decreased range of motion.  No back pain.    Physical Exam  BP 136/70 (BP Location: Left Arm, Cuff Size: Normal)   Pulse 67   Ht 5\' 2"  (1.575 m)   Wt 162 lb (73.5 kg)   LMP  (LMP Unknown)   SpO2 100%   BMI 29.63 kg/m   GEN: A/Ox3; pleasant , NAD, elderly    HEENT:  [place]/AT,  EACs-clear, TMs-wnl, NOSE-clear, THROAT-clear, no lesions, no postnasal drip or exudate noted.   NECK:  Supple w/ fair ROM; no JVD; normal carotid impulses w/o bruits; no thyromegaly or nodules palpated; no lymphadenopathy.    RESP  Few traces wheezes , no accessory use  no dullness to percussion  CARD:  RRR, no m/r/g, no peripheral edema, pulses intact, no cyanosis or clubbing.  GI:   Soft & nt; nml bowel sounds; no organomegaly or masses detected.   Musco: Warm bil, no deformities or joint swelling noted.   Neuro: alert, no focal deficits noted.    Skin: Warm, no lesions or rashes    Lab Results:  CBC   Imaging: Nm Pulmonary Perf And Vent  Result Date: 07/18/2016 CLINICAL DATA:  Cough, shortness of breath, wheezing, chest pain EXAM: NUCLEAR MEDICINE VENTILATION - PERFUSION LUNG SCAN TECHNIQUE: Ventilation images were obtained in multiple projections  using inhaled aerosol Tc-60m DTPA. Perfusion images were obtained in multiple projections after intravenous injection of Tc-79m MAA. RADIOPHARMACEUTICALS:  31.0 mCi Technetium-12m DTPA aerosol inhalation and 4.2 mCi Technetium-49m MAA IV COMPARISON:  Chest x-ray 07/17/2016 FINDINGS: Ventilation: No focal ventilation defect. Perfusion: No wedge shaped peripheral perfusion defects to suggest acute pulmonary embolism. IMPRESSION: No ventilation defects are noted.No wedge shaped peripheral perfusion defects to suggest acute pulmonary embolism. Electronically Signed   By: Lahoma Crocker M.D.   On: 07/18/2016 12:30   Dg Chest Portable 1 View  Result Date: 07/17/2016 CLINICAL DATA:  Shortness of breath and wheezing EXAM: PORTABLE CHEST 1 VIEW COMPARISON:  03/23/2016 FINDINGS: The heart size and mediastinal contours are within normal limits. Both lungs are clear. The visualized skeletal structures are unremarkable. IMPRESSION: No active disease. Electronically Signed   By: Inez Catalina M.D.   On: 07/17/2016 18:36     Assessment & Plan:   Asthma exacerbation Recurrent exacerbations - advised on trigger control  Frequent hospitalizations -  Patient's medications were reviewed today and patient education was given. Computerized medication calendar was adjusted/completed  Add spiriva to see if this helps No sign obstruction on Spirometry , only restriction .  Previous CT sinus neg.   Plan  Patient Instructions  May stop Gabapentin .  Add Spiriva 2 puff  daily .  Continue Dulera 2 puffs Twice daily  , rinse after use.  Follow med calendar closely and bring to each visit  Follow up Dr. Melvyn Novas  In 6- weeks and As needed   Please contact office for sooner follow up if symptoms do not improve or worsen or seek emergency care  Set up HRCT Chest .      Upper airway cough syndrome vs Asthma  Improving , will stop Gabapentin (does not like the side effects)  Patient's medications were reviewed today and  patient education was given. Computerized medication  calendar was adjusted/completed    Lung nodule Noted 81mm RUL nodule on CT chest in 06/2015  Repeat CT chest -former smoker .       Rexene Edison, NP 08/01/2016

## 2016-08-02 NOTE — Progress Notes (Signed)
Chart and office note reviewed in detail  > agree with a/p as outlined    

## 2016-08-03 ENCOUNTER — Ambulatory Visit (INDEPENDENT_AMBULATORY_CARE_PROVIDER_SITE_OTHER)
Admission: RE | Admit: 2016-08-03 | Discharge: 2016-08-03 | Disposition: A | Discharge status: Home / Self Care | Payer: Medicare Other | Source: Ambulatory Visit | Attending: Adult Health | Admitting: Adult Health

## 2016-08-03 DIAGNOSIS — J4530 Mild persistent asthma, uncomplicated: Secondary | ICD-10-CM

## 2016-08-03 DIAGNOSIS — R911 Solitary pulmonary nodule: Secondary | ICD-10-CM

## 2016-08-03 DIAGNOSIS — R0602 Shortness of breath: Secondary | ICD-10-CM | POA: Diagnosis not present

## 2016-08-03 DIAGNOSIS — R058 Other specified cough: Secondary | ICD-10-CM

## 2016-08-03 DIAGNOSIS — R05 Cough: Secondary | ICD-10-CM | POA: Diagnosis not present

## 2016-08-04 NOTE — Addendum Note (Signed)
Addended by: Lorane Gell on: 08/04/2016 04:29 PM   Modules accepted: Orders

## 2016-08-08 ENCOUNTER — Ambulatory Visit: Payer: Medicare Other | Admitting: Podiatry

## 2016-08-11 ENCOUNTER — Encounter: Payer: Self-pay | Admitting: Cardiology

## 2016-08-11 ENCOUNTER — Ambulatory Visit (INDEPENDENT_AMBULATORY_CARE_PROVIDER_SITE_OTHER): Payer: Medicare Other | Admitting: Cardiology

## 2016-08-11 VITALS — BP 128/78 | HR 96 | Ht 62.5 in | Wt 162.0 lb

## 2016-08-11 DIAGNOSIS — E7800 Pure hypercholesterolemia, unspecified: Secondary | ICD-10-CM

## 2016-08-11 DIAGNOSIS — I10 Essential (primary) hypertension: Secondary | ICD-10-CM

## 2016-08-11 DIAGNOSIS — I2510 Atherosclerotic heart disease of native coronary artery without angina pectoris: Secondary | ICD-10-CM

## 2016-08-11 NOTE — Patient Instructions (Addendum)
Medication Instructions:  You may take your Furosemide as needed Stop your Brilinta as instructed. Continue all other medications as listed.  Follow-Up: Follow up in 6 months with Bonney Leitz, PA.  You will receive a letter in the mail 2 months before you are due.  Please call us when you receive this letter to schedule your follow up appointment.  Follow up in 1 year with Dr. Marlou Porch.  You will receive a letter in the mail 2 months before you are due.  Please call us when you receive this letter to schedule your follow up appointment.  If you need a refill on your cardiac medications before your next appointment, please call your pharmacy.  Thank you for choosing [facility]!!

## 2016-08-11 NOTE — Progress Notes (Signed)
Cardiology Office Note    Date:  08/11/2016   ID:  [name], DOB [birth year], MRN [id number]  PCP:  [name], MD  Cardiologist:   Candee Furbish, MD     History of Present Illness:  [name] is a 66 y.o. female with coronary artery disease status post drug-eluting stent 2 to the distal RCA in September 2017 in the setting of non-ST elevation myocardial infarction with concomitant asthma, diabetes, hypertension, hyperlipidemia, prior pulmonary embolism and chronic diastolic heart failure.  Ejection fraction was normal 65-70% with mild mitral regurgitation. Bystolic utilized because of severe asthma.  She reported easy bruising.Mild to moderate in severity, all over her body at times. No major melena, hematemesis. This is an annoyance to her.   She was recently in the hospital in March 2018 with asthma flare. She had influenza B. Tamiflu. Dyspnea improved.She is currently having no chest pain. No shortness of breath.    Past Medical History:  Diagnosis Date  . Anxiety   . Asthma   . Bronchitis   . Chronic diastolic CHF (congestive heart failure) ([place])   . Chronic diastolic CHF (congestive heart failure) ([place]) 07/17/2016  . Coronary artery disease    a. NSTEMI 12/2015 - LHC 01/18/16: s/p overlapping DESx2 to RCA, 10% ost-prox Cx,10% mLAD.  Marland Kitchen Depression   . Diabetes mellitus   . Dyslipidemia   . GERD (gastroesophageal reflux disease)   . Heart attack   . Hypertension   . Hypertensive heart disease   . NSTEMI (non-ST elevated myocardial infarction) ([place]) 12/2015  . Pneumonia     Past Surgical History:  Procedure Laterality Date  . ABDOMINAL HYSTERECTOMY    . CARDIAC CATHETERIZATION  1994   minimal LAD dz, no other dz, EF normal  . CARDIAC CATHETERIZATION N/A 01/18/2016   Procedure: Left Heart Cath and Coronary Angiography;  Surgeon: Burnell Blanks, MD;  Location: [facility];  Service: Cardiovascular;  Laterality: N/A;  . CARDIAC  CATHETERIZATION N/A 01/18/2016   Procedure: Coronary Stent Intervention;  Surgeon: Burnell Blanks, MD;  Location: [place];  Service: Cardiovascular;  Laterality: N/A;  . COLONOSCOPY WITH PROPOFOL N/A 04/09/2014   Procedure: COLONOSCOPY WITH PROPOFOL;  Surgeon: Cleotis Nipper, MD;  Location: [facility] ENDOSCOPY;  Service: Endoscopy;  Laterality: N/A;  . CORONARY STENT PLACEMENT  01/19/2016   STENT SYNERGY DES 2.99B71 drug eluting stent was successfully placed, and overlaps the 2.5 x 38 mm Synergy stent placed distally.  . ESOPHAGOGASTRODUODENOSCOPY (EGD) WITH PROPOFOL N/A 04/09/2014   Procedure: ESOPHAGOGASTRODUODENOSCOPY (EGD) WITH PROPOFOL;  Surgeon: Cleotis Nipper, MD;  Location: [facility] ENDOSCOPY;  Service: Endoscopy;  Laterality: N/A;  . HEMORRHOID SURGERY    . HERNIA REPAIR    . KNEE ARTHROSCOPY      Current Medications: Outpatient Medications Prior to Visit  Medication Sig Dispense Refill  . acetaminophen-codeine (TYLENOL #3) 300-30 MG tablet TAKE ONE TABLET BY MOUTH EVERY 4 HOURS AS NEEDED FOR COUGH 40 tablet 0  . albuterol (PROAIR HFA) 108 (90 Base) MCG/ACT inhaler Inhale 2 puffs into the lungs every 4 (four) hours as needed for wheezing or shortness of breath. 1 Inhaler 5  . albuterol (PROVENTIL) (2.5 MG/3ML) 0.083% nebulizer solution Take 3 mLs (2.5 mg total) by nebulization every 4 (four) hours as needed for shortness of breath. 500 mL 1  . ALPRAZolam (XANAX) 0.5 MG tablet Take 1 tablet (0.5 mg total) by mouth 2 (two) times daily as needed for anxiety. [place]  tablet 0  . aspirin EC 81 MG tablet Take 81 mg by mouth daily.     Marland Kitchen atorvastatin (LIPITOR) 40 MG tablet Take 40 mg by mouth daily.    . Biotin 5000 MCG CAPS Take 1 capsule by mouth daily.    Marland Kitchen dextromethorphan (DELSYM) 30 MG/5ML liquid Take 90 mg by mouth 2 (two) times daily as needed for cough.     . diltiazem (DILACOR XR) 180 MG 24 hr capsule Take 180 mg by mouth daily.    . divalproex (DEPAKOTE ER) 500 MG 24 hr  tablet Take 1 tablet (500 mg total) by mouth at bedtime. 30 tablet 11  . esomeprazole (NEXIUM) 40 MG capsule Take 1 capsule (40 mg total) by mouth 2 (two) times daily before a meal. Take twice a day for 4days then resume once daily    . estradiol (ESTRACE) 1 MG tablet Take 1 mg by mouth daily.     . fluticasone (FLONASE) 50 MCG/ACT nasal spray Place 2 sprays into both nostrils daily as needed for allergies or rhinitis.    Marland Kitchen guaiFENesin (MUCINEX) 600 MG 12 hr tablet Take 600 mg by mouth 2 (two) times daily.    . Melatonin 3 MG TABS Take 1 tablet by mouth at bedtime.    . metFORMIN (GLUCOPHAGE) 500 MG tablet Take 1 tablet (500 mg total) by mouth 2 (two) times daily with a meal. 60 tablet 0  . mometasone-formoterol (DULERA) 200-5 MCG/ACT AERO Inhale 2 puffs into the lungs 2 (two) times daily. 1 Inhaler 0  . nebivolol (BYSTOLIC) 5 MG tablet Take 5 mg by mouth daily.    . nitroGLYCERIN (NITROSTAT) 0.4 MG SL tablet Place 0.4 mg under the tongue every 5 (five) minutes as needed for chest pain. x3 as needed    . potassium chloride SA (K-DUR,KLOR-CON) 20 MEQ tablet Take 20 mEq by mouth daily.     . ranitidine (ZANTAC) 150 MG capsule Take 150 mg by mouth every evening.    Marland Kitchen Respiratory Therapy Supplies (FLUTTER) DEVI Use as directed 1 each 0  . sodium chloride (OCEAN) 0.65 % SOLN nasal spray Place 1 spray into both nostrils daily as needed for congestion.     . ticagrelor (BRILINTA) 90 MG TABS tablet Take 1 tablet (90 mg total) by mouth every 12 (twelve) hours. 180 tablet 3  . Tiotropium Bromide Monohydrate (SPIRIVA RESPIMAT) 2.5 MCG/ACT AERS Inhale 2 puffs into the lungs daily. 1 Inhaler 5  . valsartan (DIOVAN) 320 MG tablet Take 1 tablet (320 mg total) by mouth daily. 90 tablet 3  . furosemide (LASIX) 40 MG tablet Take 1 tablet (40 mg total) by mouth daily. 90 tablet 3   No facility-administered medications prior to visit.      Allergies:   Amlodipine besylate; Lisinopril; and Pantoprazole sodium    Social History   Social History  . Marital status: Married    Spouse name: N/A  . Number of children: 3  . Years of education: some college   Occupational History  . Merchandiser    Social History Main Topics  . Smoking status: Former Smoker    Packs/day: 1.00    Years: 30.00    Types: Cigarettes    Quit date: 01/09/2004  . Smokeless tobacco: Never Used  . Alcohol use No  . Drug use: No  . Sexual activity: Not on file   Other Topics Concern  . Not on file   Social History Narrative   Pt lives with husband  in [place].   Right-handed.   2 cups caffeine per day.     Family History:  The patient's family history includes Allergies in her mother; Asthma in her mother; CAD (age of onset: 64) in her father; CAD (age of onset: 56) in her sister; Congestive Heart Failure in her sister; Diabetes in her father; Hypertension in her father.   ROS:   Please see the history of present illness.    ROS All other systems reviewed and are negative.   PHYSICAL EXAM:   VS:  BP 128/78   Pulse 96   Ht 5' 2.5" (1.588 m)   Wt 162 lb (73.5 kg)   LMP  (LMP Unknown)   SpO2 96%   BMI 29.16 kg/m    GEN: Well nourished, well developed, in no acute distress  HEENT: normal  Neck: no JVD, carotid bruits, or masses Cardiac: RRR; no murmurs, rubs, or gallops,no edema  Respiratory:  clear to auscultation bilaterally, normal work of breathing GI: soft, nontender, nondistended, + BS MS: no deformity or atrophy  Skin: warm and dry, no rash Neuro:  Alert and Oriented x 3, Strength and sensation are intact Psych: euthymic mood, full affect   Wt Readings from Last 3 Encounters:  08/11/16 162 lb (73.5 kg)  08/01/16 162 lb (73.5 kg)  07/21/16 159 lb 12.8 oz (72.5 kg)      Studies/Labs Reviewed:   EKG:  EKG is not ordered today.    Recent Labs: 01/20/2016: ALT 26 01/21/2016: Magnesium 2.4 02/23/2016: Pro B Natriuretic peptide (BNP) 13.0 07/18/2016: B Natriuretic Peptide 9.2 07/20/2016:  Hemoglobin 10.4; Platelets 289 07/21/2016: BUN 22; Creatinine, Ser 1.00; Potassium 4.2; Sodium 134   Lipid Panel    Component Value Date/Time   CHOL 196 07/18/2016 0142   TRIG 203 (H) 07/18/2016 0142   HDL 48 07/18/2016 0142   CHOLHDL 4.1 07/18/2016 0142   VLDL 41 (H) 07/18/2016 0142   LDLCALC 107 (H) 07/18/2016 0142    Additional studies/ records that were reviewed today include:      ASSESSMENT:    1. Coronary artery disease involving native heart without angina pectoris, unspecified vessel or lesion type   2. Pure hypercholesterolemia   3. Essential hypertension      PLAN:  In order of problems listed above:  CAD: s/p NSTEMI: cath on 01/18/2016 with severe single vessel CAD with severe stenosis in the mid and distal RCA with successful PTCA/DES x 2 mid and distal RCA. -- Continue DAPTwith ASA and Brilinta for one year. Continue statin &BB ( on bystolic in the setting of chronic lung disease)  -- Bruising noted from DAPT. No major bleeding. Continue.  - Stable. There was some worry about the CT scan done for lung disease. Her stents were noted. Other coronary calcification noted including left main. Continue with aggressive secondary prevention as we are currently prescribing and advocating.  Chronic diastolic CHF: Appearseuvolemic today. Since she is doing much better from a fluid standpoint, we will discontinue the Lasix and use on a when necessary basis.  Aortic atherosclerosis: Continue statin  Chronic asthma: with recent exacerbation in the setting of acute bronchitis. Done with prednisone taper , no changes. Mild wheeze heard in right lower lung  HTN: BP much better controlled. Bystolic. Stable  HLD: LDL 110. Goal <70. Continue on atorva 80mg  daily    DM2: HgA1c 7.0. Continue Home rx.  Six-month follow-up with Nell Range, PA-12 months with me  Medication Adjustments/Labs and Tests Ordered: Current medicines  are reviewed at length with the patient  today.  Concerns regarding medicines are outlined above.  Medication changes, Labs and Tests ordered today are listed in the Patient Instructions below. Patient Instructions  Medication Instructions:  You may take your Furosemide as needed Stop your Brilinta as instructed. Continue all other medications as listed.  Follow-Up: Follow up in 6 months with Bonney Leitz, PA.  You will receive a letter in the mail 2 months before you are due.  Please call us when you receive this letter to schedule your follow up appointment.  Follow up in 1 year with Dr. Marlou Porch.  You will receive a letter in the mail 2 months before you are due.  Please call us when you receive this letter to schedule your follow up appointment.  If you need a refill on your cardiac medications before your next appointment, please call your pharmacy.  Thank you for choosing [facility]!!        Signed, Candee Furbish, MD  08/11/2016 5:33 PM    [address], [place], [place]  [id number] Phone: [phone]; Fax: [phone]

## 2016-08-18 ENCOUNTER — Other Ambulatory Visit: Payer: Self-pay | Admitting: Internal Medicine

## 2016-08-18 DIAGNOSIS — R058 Other specified cough: Secondary | ICD-10-CM

## 2016-08-18 DIAGNOSIS — R05 Cough: Secondary | ICD-10-CM

## 2016-08-18 MED ORDER — MOMETASONE FURO-FORMOTEROL FUM 200-5 MCG/ACT IN AERO: 2.0000 | INHALATION_SPRAY | Freq: Two times a day (BID) | RESPIRATORY_TRACT | 11 refills | Status: DC

## 2016-08-18 MED ORDER — ACETAMINOPHEN-CODEINE #3 300-30 MG PO TABS: ORAL_TABLET | ORAL | 0 refills | Status: DC

## 2016-08-23 ENCOUNTER — Telehealth: Payer: Self-pay | Admitting: Adult Health

## 2016-08-23 MED ORDER — MOMETASONE FURO-FORMOTEROL FUM 200-5 MCG/ACT IN AERO: 2.0000 | INHALATION_SPRAY | Freq: Two times a day (BID) | RESPIRATORY_TRACT | 5 refills | Status: DC

## 2016-08-23 NOTE — Telephone Encounter (Signed)
Pt states she called her pharmacy to get her dulera inhaler refilled but they did not have the rx. Sent rx to pt's pharmacy of choice. She had no further questions. Nothing further is needed

## 2016-09-04 ENCOUNTER — Telehealth: Payer: Self-pay

## 2016-09-04 ENCOUNTER — Telehealth: Payer: Self-pay | Admitting: Internal Medicine

## 2016-09-04 MED ORDER — BUDESONIDE-FORMOTEROL FUMARATE 160-4.5 MCG/ACT IN AERO: 2.0000 | INHALATION_SPRAY | Freq: Two times a day (BID) | RESPIRATORY_TRACT | 0 refills | Status: DC

## 2016-09-04 NOTE — Telephone Encounter (Signed)
Patient called in and stated that the cost of her Kary Kos has gone up more than fifty dollars and she would like a call back to discuss having a different medication to take place of brilinta. Her call back number is [phone]

## 2016-09-04 NOTE — Telephone Encounter (Signed)
Spoke with the pt  She states [name] is too expensive  No samples available  Per MW- ok to give sample symb 160 to last until ov and bring formulary  Pt aware and sample up front  Nothing further needed

## 2016-09-06 NOTE — Telephone Encounter (Signed)
Stop Brilinta, start Plavix 75 mg once a day. It has been almost one year since placement of DES. In September we will be able to discontinue. Candee Furbish, MD

## 2016-09-06 NOTE — Telephone Encounter (Signed)
[name] is a 66 y.o. female with coronary artery disease status post drug-eluting stent 2 to the distal RCA in September 2017 in the setting of non-ST elevation myocardial infarction with concomitant asthma, diabetes, hypertension, hyperlipidemia, prior pulmonary embolism and chronic diastolic heart failure.  Pt is requesting a lower cost alternative.  Will forward to Dr Marlou Porch for review and any orders.

## 2016-09-07 MED ORDER — CLOPIDOGREL BISULFATE 75 MG PO TABS: 75.0000 mg | ORAL_TABLET | Freq: Every day | ORAL | 3 refills | Status: DC

## 2016-09-07 NOTE — Addendum Note (Signed)
Addended by: Shellia Cleverly on: 09/07/2016 09:44 AM   Modules accepted: Orders

## 2016-09-07 NOTE — Telephone Encounter (Signed)
Pt aware OK to d/c Brilinta and start Plavix 75 mg a day.  She would like a 90 day supply sent into [facility].

## 2016-09-13 ENCOUNTER — Ambulatory Visit: Payer: Medicare Other | Admitting: Podiatry

## 2016-09-14 ENCOUNTER — Telehealth: Payer: Self-pay | Admitting: *Deleted

## 2016-09-14 ENCOUNTER — Ambulatory Visit: Payer: Self-pay | Admitting: Internal Medicine

## 2016-09-14 NOTE — Telephone Encounter (Signed)
-----   Message from Tanda Rockers, MD sent at 09/14/2016 10:45 AM EDT ----- This pt is high risk for ER and needs to be called for ultimatum either come to the office asap with all meds in hand  or will face discharge possibility as she missed her critical f/u appt today p we gave her a sample that would only last until now

## 2016-09-14 NOTE — Telephone Encounter (Signed)
Spoke with the pt and notified of recs per MW  She states forgot she had the appt today  She rescheduled for 09/18/16 at 9 am

## 2016-09-18 ENCOUNTER — Ambulatory Visit (INDEPENDENT_AMBULATORY_CARE_PROVIDER_SITE_OTHER): Payer: Medicare Other | Admitting: Internal Medicine

## 2016-09-18 ENCOUNTER — Encounter: Payer: Self-pay | Admitting: Internal Medicine

## 2016-09-18 VITALS — BP 134/82 | HR 74 | Ht 62.5 in | Wt 164.0 lb

## 2016-09-18 DIAGNOSIS — R05 Cough: Secondary | ICD-10-CM

## 2016-09-18 DIAGNOSIS — J4530 Mild persistent asthma, uncomplicated: Secondary | ICD-10-CM

## 2016-09-18 DIAGNOSIS — R0609 Other forms of dyspnea: Secondary | ICD-10-CM | POA: Diagnosis not present

## 2016-09-18 DIAGNOSIS — R058 Other specified cough: Secondary | ICD-10-CM

## 2016-09-18 LAB — NITRIC OXIDE: Nitric Oxide: 15

## 2016-09-18 MED ORDER — BUDESONIDE-FORMOTEROL FUMARATE 80-4.5 MCG/ACT IN AERO: 2.0000 | INHALATION_SPRAY | Freq: Two times a day (BID) | RESPIRATORY_TRACT | 11 refills | Status: DC

## 2016-09-18 MED ORDER — BUDESONIDE-FORMOTEROL FUMARATE 80-4.5 MCG/ACT IN AERO: 2.0000 | INHALATION_SPRAY | Freq: Two times a day (BID) | RESPIRATORY_TRACT | 0 refills | Status: DC

## 2016-09-18 NOTE — Patient Instructions (Signed)
Change the symbicort to 80 Take 2 puffs first thing in am and then another 2 puffs about 12 hours later.    Work on inhaler technique:  relax and gently blow all the way out then take a nice smooth deep breath back in, triggering the inhaler at same time you start breathing in.  Hold for up to 5 seconds if you can. Blow out thru nose. Rinse and gargle with water when done  Use the med calendar daily to organize your medications as a "checklist"     See [name] NP in 4  weeks with all your medications, even over the counter meds, separated in two separate bags, the ones you take no matter what vs the ones you stop once you feel better and take only as needed when you feel you need them.   [name]  will generate for you a new user friendly medication calendar that will put [place] all on the same page re: your medication use.

## 2016-09-18 NOTE — Progress Notes (Signed)
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 Asthma     Brief history of present illness from the day of admission and additional interim summary    [name] a 66 y.o.femalewith medical history significant for hypertension, diabetes on metformin, history of prior PE and asthma. This past Thursday patient began having upper respiratory infection symptoms with cough and congestion with productive yellow sputum. Because of this she developed asthma exacerbation symptoms which are typical for her with increased wheezing and tightness. She is not had fevers or chills. She's not had any GI symptoms such as nausea, vomiting or diarrhea. She went to her PCP who prescribed prednisone. In addition she utilized her normal nebulizers and MDIs without improvement in her symptoms. Because of increased respiratory distress she presented to the ER today. No documentation from ER to determine if patient was hypoxemic at presentation. She was wheezing significantly upon arrival and becoming dyspneic with talking. She was also reporting pleuritic chest tightness with taking a deep breath.   She has been exposed to multiple sick contacts with similar symptoms prior to onset of her symptoms.She was positive for coronavirus which likely  exacerbated her asthma, also had NSTEMI requiring left heart cath and stent placement.   Hospital issues addressed     1. Acute on chronic asthma exacerbation. She still wheezing, continue IV steroids, added Singulair, added nebulizer treatments both scheduled and as needed, will add Dulera as well and monitor. Respiratory panel positive for coronavirus likely URI exacerbated her asthma + dCHF, SOB better, she has no oxygen requirement for the last 2 days, she still has minimal wheezing but this is mostly upper airway coming from her throat, will be placed on steroid taper, continue home nebulizer treatments and follow with [name] her pulmonary physician in a week.  2. Hypertension. Hydralazine dose doubled, ARB + Cardizem. Follow with PCP for blood pressure control.  3. History of PE. Normal d-dimer.  4. Hypokalemia. Replaced and stable.  5. GERD. On PPI.  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 Continue statin & Cardizem, no B.Blocker due to Asthma, pain free. All with cardiology outpatient.  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  8. DM2 - continue Home rx.   Discharge diagnosis     Principal Problem:   Acute bronchitis with asthma with acute exacerbation Active Problems:   Diabetes mellitus type 2, uncontrolled ([place])   Essential  hypertension   G E R D   PULMONARY EMBOLISM, HX OF   Hyperlipidemia   Chest pain   Acute hypokalemia   Hypomagnesemia   Acute respiratory failure (HCC)   NSTEMI (non-ST elevated myocardial infarction) (HCC)   CAD in native artery   Hypertensive heart disease    01/24/2016  Extended post hosp f/u ov/ transition of care/ /[name] re: dtca asthma/ vcd symb 160 and pred 5  x 10 mg daily  Chief Complaint  Patient presents with  . Follow-up    Pt. recently got out the hopistal for an asthma attack, Pt. states her breathing remains the same, coughing with some yellow to white mucus,Has been needing to use Ventolin more often  last doing well 2 months prior to OV   While on Symb 160/ gerd rx but not dosing ac as rec  Waking up prematurely x 2 months with cough/wheeze/ sob but never contacted me as per the instructions rec Plan A = Automatic = dulera 100 Take 2 puffs first thing in am and then another 2 puffs about 12 hours later.                                      Nexum 40 mg Take 30-60 min before first meal of the day and Pepcid AC 20 mg  at bedtime Work on inhaler technique  Plan B = Backup Only use your albuterol as a rescue medication Plan C = Crisis - only use your albuterol nebulizer if you first try Plan B and it fails to help > ok to use the nebulizer up to every 4 hours but if start needing it regularly call for immediate appointment GERD diet  Take delsym two tsp every 12 hours and use the flutter valve as much as possible and supplement if needed with  tramadol 50 mg up to 2 every 4 hours to suppress the urge to cough  Once you have eliminated the cough for 3 straight days try reducing the tramadol first,  then the delsym as tolerated.   Prednisone 5 mg x 8, then 6, then 4 then 2, then 1 daily x 2 days and off  please schedule a follow up office visit in 2 weeks, sooner if needed with all meds/ inhalers/ neb solutions in hand     02/23/2016  f/u ov/[name] re: dtca vs vcd/ did not bring meds as req Chief Complaint  Patient presents with  . Follow-up    Cough had stopped and then resumed again 1 day ago. She states she woke up coughing at 2 am- non prod and also having chest tightness.   husband says throat clearing never stops x years  Some better on pred but flared w/in a few days of stopping it   rec Plan A = Automatic = dulera 100 Take 2 puffs  first thing in am and then another 2 puffs about 12 hours later.                                      Nexum 40 mg Take 30-60 min before first meal of the day and Pepcid AC 20 mg  at bedtime  Add neurontin (gabapentin) 100 mg three times daily  Work on perfecting  inhaler technique: Plan B = Backup Only use your albuterol as a rescue medication Plan C = Crisis - only use your albuterol nebulizer Take delsym two tsp every 12 hours and use the flutter valve as much as possible and supplement if needed with  Tylenol #3   up to 1-2 every 4 hours to suppress the urge to cough  Once you have eliminated the cough for 3 straight days try reducing the tylenol #3  first,  then the delsym as tolerated.   Prednisone 10 mg take  4 each am x 2 days,   2 each am x 2 days,  1 each am x 2 days and stop  Please remember to go to the lab  department downstairs for your tests - we will call you with the results when they are available. Please schedule a follow up office visit in 2 weeks, sooner if needed with all meds/ inhalers/ neb solutions in hand to see [name] NP for med calendar Late add : given dulera 100 x one sample = 60 pffs - check count on return   02/27/16 admit > no better at all    03/10/2016 NP Post hospital follow up  She presents for a post hospital follow-up Patient was admitted October 29 through November 2 for an asthma exacerbation  treated with IV steroids, and transitioned over to prednisone. At discharge. D-dimer was negative. Venous Dopplers were negative. She is feeling some better, cough and wheezing are decreased.  Does have some post nasal drainage. rec Add Zyrtec 10mg . At bedtime   Saline nasal rinses As needed   Delsym 2 tsp Twice daily  As needed  Cough  Finish Prednisone as directed.  Follow up with [name]  In 4 weeks and As needed   Please contact office for sooner follow up if symptoms do not improve or worsen or seek emergency care         04/07/2016  exteneded f/u ov/Shannette Tabares re:  Cough/wheezing and sob since July 2017 with nl spirometry today  Chief Complaint  Patient presents with  . Follow-up    Coughing and wheezing "all day"- cough is occ prod with yellow sputum and she has also had yellow nasal d/c. Her breathing is "so so". She is using her albuterol inhaler every 4 hours "I always need it".  She had been using neb 2 x daily, but has not used for the past several days.   no meds/ never got calendar/ not clear at all what she's taking other than way too much saba rec Stop dulera for now and continue symbicort 80 2bid  Work on inhaler technique:  relax and gently blow all the way out then take a nice smooth deep breath back in, triggering the inhaler at same time you start breathing in.  Hold for up to 5 seconds if you can. Blow out thru nose. Rinse and gargle with water when done For cough > cough into the flutter valve and increase the neurontin to 300 mg three times daily  Augmentin 875 mg take one pill twice daily  X 10 days - take at breakfast and supper with large glass of water.  It would help reduce the usual side effects (diarrhea and yeast infections) if you ate cultured yogurt at lunch.  Plan A = Automatic = Symbicort 80 Take 2 puffs first thing in am and then another 2 puffs about 12 hours  later.  Plan B = Backup Only use your albuterol (Proair) as a rescue medication to be used if you can't catch your breath by resting or doing a relaxed purse lip breathing pattern.  - The less you use it, the better it will work when you need it. - Ok to use the inhaler up to 2 puffs  every 4 hours if you must but call for appointment if use goes up over your usual need - Don't leave home without it !!  (think of it like the spare tire for your car)  Plan C = Crisis - only use your albuterol nebulizer if you first try Plan B and it fails to help > ok to use the nebulizer up to every 4 hours but if start needing it regularly call  for immediate appointment    05/08/2016  f/u ov/Levan Aloia re: asthma/ vcd on symb 80 2bid / gabapentin 300 tid  Chief Complaint  Patient presents with  . Follow-up    1 mo f/u. No asthma flare ups since last visit. No breathing issues. Denies any SOB or chest pains.   Not limited by breathing from desired activities   rec No change in medications Work on inhaler technique:  relax and gently blow all the way out then take a nice smooth deep breath back in, triggering the inhaler at same time you start breathing in.  Hold for up to 5 seconds if you can. Blow out thru nose. Rinse and gargle with water when done   06/05/16 NP med calendar  Decrease Gabapentin 300mg  At bedtime  . Follow med calendar closely and bring to each visit  Follow up [name]  In 6-8 weeks and As needed      07/17/2016 acute extended ov/Mabell Esguerra re: asthma flare / has med calendar but not complete (no saba)  Not using flutter / dulera 200 2bid Chief Complaint  Patient presents with  . Acute Visit    Increased cough for the past 3-4 days. Cough is occ prod with yellow sputum.  She has also noticed some wheezing and left ear pain.   sick onset 3-4 days ears hurt/ nasal congested and then started coughing only used saba x 2 puffs and not the nebulizer Poor insight into action plan previously reviewed Also poor hfa  rec zpak  Prednisone 10 mg take  4 each am x 2 days,   2 each am x 2 days,  1 each am x 2 days and stop  Plan A = Automatic = dulera 200 Take 2 puffs first thing in am and then another 2 puffs about 12 hours later.  Work on inhaler technique:  relax and gently blow all the way out then take a nice smooth deep breath back in, triggering the inhaler at same time you start breathing in.  Hold for up to 5 seconds if you can. Blow out thru nose. Rinse and gargle with water when done Plan B = Backup Only use your albuterol Proair  Plan C = Crisis - only use your albuterol nebulizer if you first try Plan B and it fails to  help > ok to use the nebulizer up to every 4 hours but if start needing it regularly call for immediate appointment For cough >  Delsym and tylenol #3 and use the flutter valve as much as you can      08/01/16 NP med calendar: FENO 10 on symb 160 but still wheeze and sob/ no cough rec May  stop Gabapentin .  Add Spiriva 2 puff  daily .  Continue Dulera 2 puffs Twice daily  , rinse after use.  Follow med calendar closely and bring to each visit      09/18/2016  f/u ov/Jezreel Sisk re:  UACS vs Asthma brought med calendar but not updated / on symb 160 / max gerd rx  Chief Complaint  Patient presents with  . Follow-up    Pt c/o occ wheezing. She has not used rescue inhaler or neb recently.   still feels wheezing  And doe = MMRC1 = can walk nl pace, flat grade, can't hurry or go uphills or steps s sob    No obvious day to day or daytime variability or assoc excess/ purulent sputum or mucus plugs or hemoptysis or cp or chest tightness,  or overt sinus or hb symptoms. No unusual exp hx or h/o childhood pna/ asthma or knowledge of premature birth.  Sleeping ok without nocturnal  or early am exacerbation  of respiratory  c/o's or need for noct saba. Also denies any obvious fluctuation of symptoms with weather or environmental changes or other aggravating or alleviating factors except as outlined above   Current Medications, Allergies, Complete Past Medical History, Past Surgical History, Family History, and Social History were reviewed in [employer] record.  ROS  The following are not active complaints unless bolded sore throat, dysphagia, dental problems, itching, sneezing,  nasal congestion or excess/ purulent secretions, ear ache,   fever, chills, sweats, unintended wt loss, classically pleuritic or exertional cp,  orthopnea pnd or leg swelling, presyncope, palpitations, abdominal pain, anorexia, nausea, vomiting, diarrhea  or change in bowel or bladder habits, change in stools  or urine, dysuria,hematuria,  rash, arthralgias, visual complaints, headache, numbness, weakness or ataxia or problems with walking or coordination,  change in mood/affect or memory.               Objective:   Physical Exam  amb bf nad     02/23/2016   168 >  04/07/2016  168 > 05/08/2016    168 >  07/17/2016  162 > 09/18/2016    164     Vital signs reviewed    - Note on arrival 02 sats  100% on RA    HEENT: nl dentition, turbinates, and oropharynx. Nl external ear canals without cough reflex   NECK :  without JVD/Nodes/TM/ nl carotid upstrokes bilaterally   LUNGS: no acc muscle use,    Minimal wheeze all upper airway    CV:  RRR  no s3 or murmur or increase in P2, no edema   ABD:  soft and nontender with nl excursion in the supine position. No bruits or organomegaly, bowel sounds nl  MS:  warm without deformities, calf tenderness, cyanosis or clubbing  SKIN: warm and dry without lesions    NEURO:  alert, approp, no deficits       I personally reviewed images and agree with radiology impression as follows:  CTHigh res Chest  08/04/16 1. Greater than 12 month stability of solitary 4 mm solid right upper lobe pulmonary nodule, considered benign. 2. No evidence of interstitial lung disease. 3. Mild patchy air trapping in the lungs, suggesting small airways disease. Marland Kitchen

## 2016-09-19 NOTE — Assessment & Plan Note (Addendum)
-  CT sinus 08/06/14 > neg  - 08/27/2014 rec keep duler 100 on hand to use for any flare  - FENO 01/24/2016  =   13 on symb 160 2bid with persistent cough > changed to dulera 100 - 01/24/2016  After extensive coaching HFA effectiveness =    75%  - Allergy profile 02/23/16  >  Eos 0.0 /  IgE  87 pos  RAST  Grass/ trees  - Spirometry 04/07/2016  No obstruction based on fev1/fvc or f/v curve  - improved 05/08/2016 on gabapentin 300 tid > no change rx  - FENO 08/01/16 = 10 on symb 160  - FENO 09/18/2016  =   15 on symb 160 2bid  rec reduce symb to 80 2bid  Clearly the residual "wheezing" is upper airway on exam and not from a pulmonary source so All goals of chronic asthma control met including optimal function and elimination of symptoms with minimal need for rescue therapy.  Contingencies discussed in full including contacting this office immediately if not controlling the symptoms using the rule of two's.     Will try reduce the symbicort to 80 2bid to reduce any contribution of upper airway cough syndrome from ICS mimicking asthma.  I had an extended discussion with the patient reviewing all relevant studies completed to date and  lasting 15 to 20 minutes of a 25 minute visit    Each maintenance medication was reviewed in detail including most importantly the difference between maintenance and prns and under what circumstances the prns are to be triggered using an action plan format that is not reflected in the computer generated alphabetically organized AVS but rather by a customized med calendar that reflects the AVS meds with confirmed 100% correlation.   In addition, Please see AVS for unique instructions that I personally wrote and verbalized to the the pt in detail and then reviewed with pt  by my nurse highlighting any  changes in therapy recommended at today's visit to their plan of care.

## 2016-09-19 NOTE — Assessment & Plan Note (Addendum)
See uacs > try reduce symb to 80 bid   - The proper method of use, as well as anticipated side effects, of a metered-dose inhaler are discussed and demonstrated to the patient. Improved effectiveness after extensive coaching during this visit to a level of approximately 75 % from a baseline of 50 %

## 2016-09-19 NOTE — Assessment & Plan Note (Addendum)
09/18/2016  Walked RA x 3 laps @ 185 ft each stopped due to  End of study, fast pace, min sob,  No  desat   Not able to reproduce chronic doe described, suspect this is a conditioning issue and not physiologically significant otherwise

## 2016-10-17 ENCOUNTER — Encounter: Payer: Self-pay | Admitting: Adult Health

## 2016-11-14 ENCOUNTER — Encounter: Payer: Self-pay | Admitting: Nurse Practitioner

## 2016-11-14 ENCOUNTER — Ambulatory Visit: Payer: Medicare Other | Admitting: Nurse Practitioner

## 2016-11-14 ENCOUNTER — Ambulatory Visit (INDEPENDENT_AMBULATORY_CARE_PROVIDER_SITE_OTHER): Payer: Medicare Other | Admitting: Nurse Practitioner

## 2016-11-14 VITALS — BP 123/73 | HR 73 | Wt 157.0 lb

## 2016-11-14 DIAGNOSIS — R569 Unspecified convulsions: Secondary | ICD-10-CM

## 2016-11-14 MED ORDER — DIVALPROEX SODIUM ER 500 MG PO TB24: 500.0000 mg | ORAL_TABLET | Freq: Every day | ORAL | 6 refills | Status: DC

## 2016-11-14 NOTE — Patient Instructions (Signed)
Restart Depakote 500mg  daily Please remember, common seizure triggers are: Sleep deprivation, dehydration, overheating, stress, hypoglycemia or skipping meals, certain medications or excessive alcohol use, especially stopping alcohol abruptly if you have had heavy alcohol use before (aka alcohol withdrawal seizure). If you have a prolonged seizure over 2-5 minutes or back to back seizures, call or have someone call 911 or take you to the nearest emergency room. You cannot drive a car or operate any other machinery or vehicle within 6 months of a seizure. Please do not swim alone or take a tub bath for safety. Do not cook with large quantities of boiling water or oil for safety. Take your medicine for seizure prevention regularly and do not skip doses or stop medication abruptly and tone are told to do so by your healthcare provider. Try to get a refill on your antiepileptic medication ahead of time, so you are not at risk of running out. If you run out of the seizure medication and do not have a refill at hand she may run into medication withdrawal seizures. Avoid taking Wellbutrin, narcotic pain medications and tramadol, as they can lower seizure threshold.  Follow up in 6 months.

## 2016-11-14 NOTE — Progress Notes (Signed)
[facility]  PATIENT: [name] DOB: 20-Oct-1950   REASON FOR VISIT: Follow-up for seizure disorder HISTORY FROM: Patient  HISTORY OF PRESENT ILLNESS:[name] [name] is a 66 years old left-handed female, seen in refer by her primary care doctor [name] for evaluation of episodes of confusion on January 27 2016, I reviewed and summarized the referring note, she had a history of type 2 diabetes, hypertension, hyperlipidemia, pulmonary emboli and, depression, COPD, coronary artery disease, stent placement following a heart attack in January 18 2016, I reviewed echo report on January 18 2016, ejection fraction 65-70%, severe concentric hypertrophy, septal wall thickness was increased, with mild hypertrophy of the posterior wall, abnormal relaxation, increased filling pressure, I also reviewed laboratory evaluation BMP showed elevated glucose 270, creatinine 1.06, cholesterol 207, LDL 110, A1c 7.0, She had her first generalized seizure in Aug 11, 2009, was taken to [place] evaluated by neurologist there, per patient, there was a suggestion of antiepileptic medications, however it was never followed through.  Over the years, she had no generalized seizure, but every few weeks to every few months, she would have episodes of sudden onset confusion, space out, lasting less than 5 minutes  She understands through extreme stress (her grandson died) in 14-Jun-2015, since then, she began to have increased spells, almost daily basis now, she noted when she was reading, cooking, has transient time elapsed, she came to confused, sometimes preceded by nausea dry mouth sensation. She denies family history of seizure, she works as a Midwife at [employer].  UPDATE May 11 2016:[name] She is now taking Depakote ER 500 mg every night, which has helped her tremendously, she no longer has recurrent spells, EEG was normal in October 2017 We have personally  reviewed MRI of the brain in November 2017, multiple periventricular and subcortical punctate scattered chronic small vessel disease, increased T2 flare hyperintensity in the mesial temporal lobe, right hippocampus is slightly smaller than the left, UPDATE 11/14/16 CM Ms Affeldt,66 year old female returns for followup. She has a history of generalized seizure in 2009/08/11 and then episodes of acute confusion lasting about 5 minutes which became more frequent after her grandson died 12-Aug-2015 . She was on Depakote when last seen by Dr. Krista Blue however she stopped the medication a couple months ago. She was encouraged to get back on medication and we discussed seizure triggers etc. She returns for reevaluation. She continues to work full-time  REVIEW OF SYSTEMS: Full 14 system review of systems performed and notable only for those listed, all others are neg:  Constitutional: neg  Cardiovascular: neg Ear/Nose/Throat: neg  Skin: neg Eyes: neg Respiratory: neg Gastroitestinal: neg  Hematology/Lymphatic: Easy bruising  Endocrine: neg Musculoskeletal:neg Allergy/Immunology: neg Neurological: neg Psychiatric: Anxiety Sleep : neg   ALLERGIES: Allergies  Allergen Reactions  . Amlodipine Besylate Other (See Comments)    Reaction unknown  . Lisinopril Other (See Comments)    Reaction unknown  . Pantoprazole Sodium Other (See Comments)    Reaction unknown    HOME MEDICATIONS: Outpatient Medications Prior to Visit  Medication Sig Dispense Refill  . acetaminophen-codeine (TYLENOL #3) 300-30 MG tablet TAKE ONE TABLET BY MOUTH EVERY 4 HOURS AS NEEDED FOR COUGH 40 tablet 0  . albuterol (PROAIR HFA) 108 (90 Base) MCG/ACT inhaler Inhale 2 puffs into the lungs every 4 (four) hours as needed for wheezing or shortness of breath. 1 Inhaler 5  . albuterol (PROVENTIL) (2.5 MG/3ML) 0.083% nebulizer solution Take 3 mLs (2.5 mg  total) by nebulization every 4 (four) hours as needed for shortness of breath. 500 mL 1  .  ALPRAZolam (XANAX) 0.5 MG tablet Take 1 tablet (0.5 mg total) by mouth 2 (two) times daily as needed for anxiety. 30 tablet 0  . aspirin EC 81 MG tablet Take 81 mg by mouth daily.     Marland Kitchen atorvastatin (LIPITOR) 40 MG tablet Take 40 mg by mouth daily.    . Biotin 5000 MCG CAPS Take 1 capsule by mouth daily.    . budesonide-formoterol (SYMBICORT) 80-4.5 MCG/ACT inhaler Inhale 2 puffs into the lungs 2 (two) times daily. 1 Inhaler 11  . clopidogrel (PLAVIX) 75 MG tablet Take 1 tablet (75 mg total) by mouth daily. 90 tablet 3  . diltiazem (DILACOR XR) 180 MG 24 hr capsule Take 180 mg by mouth daily.    . divalproex (DEPAKOTE ER) 500 MG 24 hr tablet Take 1 tablet (500 mg total) by mouth at bedtime. 30 tablet 11  . esomeprazole (NEXIUM) 40 MG capsule Take 1 capsule (40 mg total) by mouth 2 (two) times daily before a meal. Take twice a day for 4days then resume once daily    . estradiol (ESTRACE) 1 MG tablet Take 1 mg by mouth daily.     . fluticasone (FLONASE) 50 MCG/ACT nasal spray Place 2 sprays into both nostrils daily as needed for allergies or rhinitis.    . furosemide (LASIX) 40 MG tablet Take 1 tablet (40 mg total) by mouth daily. 90 tablet 3  . guaifenesin (ROBITUSSIN) 100 MG/5ML syrup Take 200 mg by mouth 3 (three) times daily as needed for cough.    . indomethacin (INDOCIN) 50 MG capsule Take 50 mg by mouth 2 (two) times daily with a meal.    . metFORMIN (GLUCOPHAGE) 500 MG tablet Take 1 tablet (500 mg total) by mouth 2 (two) times daily with a meal. 60 tablet 0  . nebivolol (BYSTOLIC) 5 MG tablet Take 5 mg by mouth daily.    . nitroGLYCERIN (NITROSTAT) 0.4 MG SL tablet Place 0.4 mg under the tongue every 5 (five) minutes as needed for chest pain. x3 as needed    . potassium chloride SA (K-DUR,KLOR-CON) 20 MEQ tablet Take 20 mEq by mouth daily.     . ranitidine (ZANTAC) 150 MG capsule Take 150 mg by mouth every evening.    Marland Kitchen Respiratory Therapy Supplies (FLUTTER) DEVI Use as directed 1 each 0  .  sodium chloride (OCEAN) 0.65 % SOLN nasal spray Place 1 spray into both nostrils daily as needed for congestion.     . Tiotropium Bromide Monohydrate (SPIRIVA RESPIMAT) 2.5 MCG/ACT AERS Inhale 2 puffs into the lungs daily. 1 Inhaler 5  . valsartan (DIOVAN) 320 MG tablet Take 1 tablet (320 mg total) by mouth daily. 90 tablet 3   No facility-administered medications prior to visit.     PAST MEDICAL HISTORY: Past Medical History:  Diagnosis Date  . Anxiety   . Asthma   . Bronchitis   . Chronic diastolic CHF (congestive heart failure) ([place])   . Chronic diastolic CHF (congestive heart failure) ([place]) 07/17/2016  . Coronary artery disease    a. NSTEMI 12/2015 - LHC 01/18/16: s/p overlapping DESx2 to RCA, 10% ost-prox Cx,10% mLAD.  Marland Kitchen Depression   . Diabetes mellitus   . Dyslipidemia   . GERD (gastroesophageal reflux disease)   . Heart attack ([place])   . Hypertension   . Hypertensive heart disease   . NSTEMI (non-ST elevated myocardial  infarction) ([place]) 12/2015  . Pneumonia     PAST SURGICAL HISTORY: Past Surgical History:  Procedure Laterality Date  . ABDOMINAL HYSTERECTOMY    . CARDIAC CATHETERIZATION  1994   minimal LAD dz, no other dz, EF normal  . CARDIAC CATHETERIZATION N/A 01/18/2016   Procedure: Left Heart Cath and Coronary Angiography;  Surgeon: Burnell Blanks, MD;  Location: [facility];  Service: Cardiovascular;  Laterality: N/A;  . CARDIAC CATHETERIZATION N/A 01/18/2016   Procedure: Coronary Stent Intervention;  Surgeon: Burnell Blanks, MD;  Location: [facility];  Service: Cardiovascular;  Laterality: N/A;  . COLONOSCOPY WITH PROPOFOL N/A 04/09/2014   Procedure: COLONOSCOPY WITH PROPOFOL;  Surgeon: Cleotis Nipper, MD;  Location: [facility];  Service: Endoscopy;  Laterality: N/A;  . CORONARY STENT PLACEMENT  01/19/2016   STENT SYNERGY DES [id number] drug eluting stent was successfully placed, and overlaps the 2.5 x 38 mm Synergy stent placed  distally.  . ESOPHAGOGASTRODUODENOSCOPY (EGD) WITH PROPOFOL N/A 04/09/2014   Procedure: ESOPHAGOGASTRODUODENOSCOPY (EGD) WITH PROPOFOL;  Surgeon: Cleotis Nipper, MD;  Location: [facility];  Service: Endoscopy;  Laterality: N/A;  . HEMORRHOID SURGERY    . HERNIA REPAIR    . KNEE ARTHROSCOPY      FAMILY HISTORY: Family History  Problem Relation Age of Onset  . Allergies Mother   . Asthma Mother   . CAD Father 52  . Diabetes Father   . Hypertension Father   . Congestive Heart Failure Sister        died in her 72s  . CAD Sister 75    SOCIAL HISTORY: Social History   Social History  . Marital status: Married    Spouse name: N/A  . Number of children: 3  . Years of education: some college   Occupational History  . Merchandiser    Social History Main Topics  . Smoking status: Former Smoker    Packs/day: 1.00    Years: 30.00    Types: Cigarettes    Quit date: 01/09/2004  . Smokeless tobacco: Never Used  . Alcohol use No  . Drug use: No  . Sexual activity: Not on file   Other Topics Concern  . Not on file   Social History Narrative   Pt lives with husband in [place].   Right-handed.   2 cups caffeine per day.     PHYSICAL EXAM  Vitals:   11/14/16 0816  Weight: 157 lb (71.2 kg)   Body mass index is 28.26 kg/m.  Generalized: Well developed, in no acute distress  Head: normocephalic and atraumatic,. Oropharynx benign  Neck: Supple,   Musculoskeletal: No deformity   Neurological examination   Mentation: Alert oriented to time, place, history taking. Attention span and concentration appropriate. Recent and remote memory intact.  Follows all commands speech and language fluent.   Cranial nerve II-XII: Pupils were equal round reactive to light extraocular movements were full, visual field were full on confrontational test. Facial sensation and strength were normal. hearing was intact to finger rubbing bilaterally. Uvula tongue midline. head turning and  shoulder shrug were normal and symmetric.Tongue protrusion into cheek strength was normal. Motor: normal bulk and tone, full strength in the BUE, BLE, fine finger movements normal, no pronator drift.  Sensory: normal and symmetric to light touch, on the face arms and legs Coordination: finger-nose-finger, heel-to-shin bilaterally, no dysmetria Reflexes: Brachioradialis 2/2, biceps 2/2, triceps 2/2, patellar 2/2, Achilles 2/2, plantar responses were flexor bilaterally. Gait and Station: Rising  up from seated position without assistance, normal stance,  moderate stride, good arm swing, smooth turning, able to perform tiptoe, and heel walking without difficulty. Tandem gait is steady. No assistive device  DIAGNOSTIC DATA (LABS, IMAGING, TESTING) - I reviewed patient records, labs, notes, testing and imaging myself where available.  Lab Results  Component Value Date   WBC 12.4 (H) 07/20/2016   HGB 10.4 (L) 07/20/2016   HCT 31.5 (L) 07/20/2016   MCV 94.9 07/20/2016   PLT 289 07/20/2016      Component Value Date/Time   NA 134 (L) 07/21/2016 0305   K 4.2 07/21/2016 0305   CL 97 (L) 07/21/2016 0305   CO2 27 07/21/2016 0305   GLUCOSE 270 (H) 07/21/2016 0305   BUN 22 (H) 07/21/2016 0305   CREATININE 1.00 07/21/2016 0305   CREATININE 1.06 (H) 01/26/2016 1608   CALCIUM 8.4 (L) 07/21/2016 0305   PROT 6.4 (L) 01/20/2016 0709   ALBUMIN 3.4 (L) 01/20/2016 0709   AST 20 01/20/2016 0709   ALT 26 01/20/2016 0709   ALKPHOS 52 01/20/2016 0709   BILITOT 1.0 01/20/2016 0709   GFRNONAA 57 (L) 07/21/2016 0305   GFRAA >60 07/21/2016 0305   Lab Results  Component Value Date   CHOL 196 07/18/2016   HDL 48 07/18/2016   LDLCALC 107 (H) 07/18/2016   TRIG 203 (H) 07/18/2016   CHOLHDL 4.1 07/18/2016   Lab Results  Component Value Date   HGBA1C 6.6 (H) 07/18/2016   No results found for: [id number] Lab Results  Component Value Date   TSH 0.515 08/06/2014      ASSESSMENT AND PLAN  66 y.o.  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 Patient has been off her Depakote for several months. She claims she didn't realize she needed to take it forever.   PLAN: Restart Depakote 500mg  ER daily Please remember, common seizure triggers are: Sleep deprivation, dehydration, overheating, stress, hypoglycemia or skipping meals, certain medications or excessive alcohol use, especially stopping alcohol abruptly if you have had heavy alcohol use before (aka alcohol withdrawal seizure). If you have a prolonged seizure over 2-5 minutes or back to back seizures, call or have someone call 911 or take you to the nearest emergency room. You cannot drive a car or operate any other machinery or vehicle within 6 months of a seizure. Please do not swim alone or take a tub bath for safety. Do not cook with large quantities of boiling water or oil for safety. Take your medicine for seizure prevention regularly and do not skip doses or stop medication abruptly and tone are told to do so by your healthcare provider. Try to get a refill on your antiepileptic medication ahead of time, so you are not at risk of running out. If you run out of the seizure medication and do not have a refill at hand she may run into medication withdrawal seizures. Avoid taking Wellbutrin, narcotic pain medications and tramadol, as they can lower seizure threshold.  Follow up in 6 months. I spent 20 minutes in total face to face time with the patient more than 50% of which was spent counseling and coordination of care, reviewing test results reviewing medications and discussing and reviewing the diagnosis of complex partial seizure disorder, importance  of  compliance with medication regimen etc. Dennie Bible, [facility], [facility], APRN  [facility] [address], [place],  [address] [phone]

## 2016-11-14 NOTE — Progress Notes (Signed)
I have reviewed and agreed above plan. 

## 2016-12-18 ENCOUNTER — Other Ambulatory Visit: Payer: Self-pay | Admitting: Family Medicine

## 2016-12-18 DIAGNOSIS — I739 Peripheral vascular disease, unspecified: Secondary | ICD-10-CM

## 2016-12-20 ENCOUNTER — Ambulatory Visit
Admission: RE | Admit: 2016-12-20 | Discharge: 2016-12-20 | Disposition: A | Discharge status: Home / Self Care | Payer: Medicare Other | Source: Ambulatory Visit | Attending: Family Medicine | Admitting: Family Medicine

## 2016-12-20 DIAGNOSIS — I739 Peripheral vascular disease, unspecified: Secondary | ICD-10-CM

## 2017-01-26 ENCOUNTER — Telehealth: Payer: Self-pay | Admitting: Cardiology

## 2017-01-26 NOTE — Telephone Encounter (Signed)
Spoke with pt who is asking if OK for her to discontinue her Plavix now since it's been 1 yr since her MI and stenting.  Advised I will double check with Dr Marlou Porch next week and call her back.

## 2017-01-26 NOTE — Telephone Encounter (Signed)
New message      Pt is calling asking for a call back. She is calling to find out about stopping one of her medications.

## 2017-01-29 NOTE — Telephone Encounter (Signed)
Reviewed with Dr Marlou Porch.  OK to discontinue the Plavix since it has been a year.

## 2017-01-29 NOTE — Telephone Encounter (Signed)
Is fine for her to be on aspirin only. Candee Furbish, MD

## 2017-01-29 NOTE — Telephone Encounter (Signed)
Pt aware OK to d/c plavix per Dr Marlou Porch.

## 2017-05-16 NOTE — Progress Notes (Signed)
[facility]  PATIENT: [name] DOB: 01-22-1951   REASON FOR VISIT: Follow-up for seizure disorder HISTORY FROM: Patient  HISTORY OF PRESENT ILLNESS:[name] is a 67 years old left-handed female, seen in refer by her primary care doctor [name] for evaluation of episodes of confusion on January 27 2016, I reviewed and summarized the referring note, she had a history of type 2 diabetes, hypertension, hyperlipidemia, pulmonary emboli and, depression, COPD, coronary artery disease, stent placement following a heart attack in January 18 2016, I reviewed echo report on January 18 2016, ejection fraction 65-70%, severe concentric hypertrophy, septal wall thickness was increased, with mild hypertrophy of the posterior wall, abnormal relaxation, increased filling pressure, I also reviewed laboratory evaluation BMP showed elevated glucose 270, creatinine 1.06, cholesterol 207, LDL 110, A1c 7.0, She had her first generalized seizure in 2009-08-22, was taken to [place] evaluated by neurologist there, per patient, there was a suggestion of antiepileptic medications, however it was never followed through.  Over the years, she had no generalized seizure, but every few weeks to every few months, she would have episodes of sudden onset confusion, space out, lasting less than 5 minutes  She understands through extreme stress (her grandson died) in 06/25/15, since then, she began to have increased spells, almost daily basis now, she noted when she was reading, cooking, has transient time elapsed, she came to confused, sometimes preceded by nausea dry mouth sensation. She denies family history of seizure, she works as a Midwife at [employer].  UPDATE May 11 2016:Jacqueline She is now taking Depakote ER 500 mg every night, which has helped her tremendously, she no longer has recurrent spells, EEG was normal in October 2017 We have personally  reviewed MRI of the brain in November 2017, multiple periventricular and subcortical punctate scattered chronic small vessel disease, increased T2 flare hyperintensity in the mesial temporal lobe, right hippocampus is slightly smaller than the left, UPDATE 11/14/16 [name],67 year old female returns for followup. She has a history of generalized seizure in 08-22-09 and then episodes of acute confusion lasting about 5 minutes which became more frequent after her grandson died 2015-08-23 . She was on Depakote when last seen by Dr. Krista Blue however she stopped the medication a couple months ago. She was encouraged to get back on medication and we discussed seizure triggers etc. She returns for reevaluation. She continues to work full-time UPDATE 1/17/2019[name], 67 year old female returns for follow-up with history of generalized seizure in August 22, 2009 and additional episodes of acute confusion lasting about 5 minutes that became more frequent after her grandson died in 08/23/2015.  She restarted her Depakote after her last visit and claims she is compliant.  She has not had further seizure activity.  She continues to work full-time.  She is independent in all ADL drives without difficulty.  She returns for reevaluation REVIEW OF SYSTEMS: Full 14 system review of systems performed and notable only for those listed, all others are neg:  Constitutional: neg  Cardiovascular: neg Ear/Nose/Throat: neg  Skin: neg Eyes: neg Respiratory: neg Gastroitestinal: neg  Hematology/Lymphatic: Easy bruising  Endocrine: neg Musculoskeletal:neg Allergy/Immunology: neg Neurological: Seizure disorder Psychiatric: neg Sleep : neg   ALLERGIES: Allergies  Allergen Reactions  . Amlodipine Besylate Other (See Comments)    Reaction unknown  . Lisinopril Other (See Comments)    Reaction unknown  . Pantoprazole Sodium Other (See Comments)    Reaction unknown    HOME  MEDICATIONS: Outpatient Medications Prior to Visit  Medication  Sig Dispense Refill  . ALPRAZolam (XANAX) 0.5 MG tablet Take 1 tablet (0.5 mg total) by mouth 2 (two) times daily as needed for anxiety. 30 tablet 0  . aspirin EC 81 MG tablet Take 81 mg by mouth daily.     Marland Kitchen atorvastatin (LIPITOR) 40 MG tablet Take 40 mg by mouth daily.    . budesonide-formoterol (SYMBICORT) 80-4.5 MCG/ACT inhaler Inhale 2 puffs into the lungs 2 (two) times daily. 1 Inhaler 11  . diltiazem (DILACOR XR) 180 MG 24 hr capsule Take 180 mg by mouth daily.    . divalproex (DEPAKOTE ER) 500 MG 24 hr tablet Take 1 tablet (500 mg total) by mouth at bedtime. 30 tablet 6  . esomeprazole (NEXIUM) 40 MG capsule Take 1 capsule (40 mg total) by mouth 2 (two) times daily before a meal. Take twice a day for 4days then resume once daily    . estradiol (ESTRACE) 1 MG tablet Take 1 mg by mouth daily.     . fluticasone (FLONASE) 50 MCG/ACT nasal spray Place 2 sprays into both nostrils daily as needed for allergies or rhinitis.    . indomethacin (INDOCIN) 50 MG capsule Take 50 mg by mouth 2 (two) times daily with a meal.    . irbesartan (AVAPRO) 300 MG tablet     . metFORMIN (GLUCOPHAGE) 500 MG tablet Take 1 tablet (500 mg total) by mouth 2 (two) times daily with a meal. 60 tablet 0  . metoprolol succinate (TOPROL-XL) 25 MG 24 hr tablet     . potassium chloride SA (K-DUR,KLOR-CON) 20 MEQ tablet Take 20 mEq by mouth daily.     . Tiotropium Bromide Monohydrate (SPIRIVA RESPIMAT) 2.5 MCG/ACT AERS Inhale 2 puffs into the lungs daily. 1 Inhaler 5  . torsemide (DEMADEX) 20 MG tablet 20 mg. 1-2 daily for swelling    . valsartan (DIOVAN) 320 MG tablet Take 1 tablet (320 mg total) by mouth daily. 90 tablet 3  . acetaminophen-codeine (TYLENOL #3) 300-30 MG tablet TAKE ONE TABLET BY MOUTH EVERY 4 HOURS AS NEEDED FOR COUGH (Patient not taking: Reported on 05/17/2017) 40 tablet 0  . albuterol (PROAIR HFA) 108 (90 Base) MCG/ACT inhaler Inhale 2 puffs into the lungs every 4 (four) hours as needed for wheezing or  shortness of breath. (Patient not taking: Reported on 05/17/2017) 1 Inhaler 5  . albuterol (PROVENTIL) (2.5 MG/3ML) 0.083% nebulizer solution Take 3 mLs (2.5 mg total) by nebulization every 4 (four) hours as needed for shortness of breath. (Patient not taking: Reported on 05/17/2017) 500 mL 1  . Biotin 5000 MCG CAPS Take 1 capsule by mouth daily.    . furosemide (LASIX) 40 MG tablet Take 1 tablet (40 mg total) by mouth daily. 90 tablet 3  . nitroGLYCERIN (NITROSTAT) 0.4 MG SL tablet Place 0.4 mg under the tongue every 5 (five) minutes as needed for chest pain. x3 as needed    . ranitidine (ZANTAC) 150 MG capsule Take 150 mg by mouth every evening.    Marland Kitchen Respiratory Therapy Supplies (FLUTTER) DEVI Use as directed (Patient not taking: Reported on 05/17/2017) 1 each 0  . traMADol (ULTRAM) 50 MG tablet 50 mg as needed.     No facility-administered medications prior to visit.     PAST MEDICAL HISTORY: Past Medical History:  Diagnosis Date  . Anxiety   . Asthma   . Bronchitis   . Chronic diastolic CHF (congestive heart failure) ([place])   .  Chronic diastolic CHF (congestive heart failure) ([place]) 07/17/2016  . Coronary artery disease    a. NSTEMI 12/2015 - LHC 01/18/16: s/p overlapping DESx2 to RCA, 10% ost-prox Cx,10% mLAD.  Marland Kitchen Depression   . Diabetes mellitus   . Dyslipidemia   . GERD (gastroesophageal reflux disease)   . Heart attack ([place])   . Hypertension   . Hypertensive heart disease   . NSTEMI (non-ST elevated myocardial infarction) ([place]) 12/2015  . Pneumonia     PAST SURGICAL HISTORY: Past Surgical History:  Procedure Laterality Date  . ABDOMINAL HYSTERECTOMY    . CARDIAC CATHETERIZATION  1994   minimal LAD dz, no other dz, EF normal  . CARDIAC CATHETERIZATION N/A 01/18/2016   Procedure: Left Heart Cath and Coronary Angiography;  Surgeon: Burnell Blanks, MD;  Location: [facility];  Service: Cardiovascular;  Laterality: N/A;  . CARDIAC CATHETERIZATION N/A 01/18/2016    Procedure: Coronary Stent Intervention;  Surgeon: Burnell Blanks, MD;  Location: [facility];  Service: Cardiovascular;  Laterality: N/A;  . COLONOSCOPY WITH PROPOFOL N/A 04/09/2014   Procedure: COLONOSCOPY WITH PROPOFOL;  Surgeon: Cleotis Nipper, MD;  Location: [facility];  Service: Endoscopy;  Laterality: N/A;  . CORONARY STENT PLACEMENT  01/19/2016   STENT SYNERGY DES [id number] drug eluting stent was successfully placed, and overlaps the 2.5 x 38 mm Synergy stent placed distally.  . ESOPHAGOGASTRODUODENOSCOPY (EGD) WITH PROPOFOL N/A 04/09/2014   Procedure: ESOPHAGOGASTRODUODENOSCOPY (EGD) WITH PROPOFOL;  Surgeon: Cleotis Nipper, MD;  Location: [facility];  Service: Endoscopy;  Laterality: N/A;  . HEMORRHOID SURGERY    . HERNIA REPAIR    . KNEE ARTHROSCOPY      FAMILY HISTORY: Family History  Problem Relation Age of Onset  . Allergies Mother   . Asthma Mother   . CAD Father 78  . Diabetes Father   . Hypertension Father   . Congestive Heart Failure Sister        died in her 51s  . CAD Sister 46    SOCIAL HISTORY: Social History   Socioeconomic History  . Marital status: Married    Spouse name: Not on file  . Number of children: 3  . Years of education: some college  . Highest education level: Not on file  Social Needs  . Financial resource strain: Not on file  . Food insecurity - worry: Not on file  . Food insecurity - inability: Not on file  . Transportation needs - medical: Not on file  . Transportation needs - non-medical: Not on file  Occupational History  . Occupation: Nutritional therapist  Tobacco Use  . Smoking status: Former Smoker    Packs/day: 1.00    Years: 30.00    Pack years: 30.00    Types: Cigarettes    Last attempt to quit: 01/09/2004    Years since quitting: 13.3  . Smokeless tobacco: Never Used  Substance and Sexual Activity  . Alcohol use: No    Alcohol/week: 0.0 oz  . Drug use: No  . Sexual activity: Not on file  Other Topics  Concern  . Not on file  Social History Narrative   Pt lives with husband in [place].   Right-handed.   2 cups caffeine per day.     PHYSICAL EXAM  Vitals:   05/17/17 1105  BP: 138/72  Pulse: 68  Weight: 151 lb 3.2 oz (68.6 kg)   Body mass index is 27.21 kg/m.  Generalized: Well developed, in no acute distress  Head: normocephalic and atraumatic,. Oropharynx benign  Neck: Supple,   Musculoskeletal: No deformity   Neurological examination   Mentation: Alert oriented to time, place, history taking. Attention span and concentration appropriate. Recent and remote memory intact.  Follows all commands speech and language fluent.   Cranial nerve II-XII: Pupils were equal round reactive to light extraocular movements were full, visual field were full on confrontational test. Facial sensation and strength were normal. hearing was intact to finger rubbing bilaterally. Uvula tongue midline. head turning and shoulder shrug were normal and symmetric.Tongue protrusion into cheek strength was normal. Motor: normal bulk and tone, full strength in the BUE, BLE, fine finger movements normal, no pronator drift.  Sensory: normal and symmetric to light touch, on the face arms and legs Coordination: finger-nose-finger, heel-to-shin bilaterally, no dysmetria Reflexes: Brachioradialis 2/2, biceps 2/2, triceps 2/2, patellar 2/2, Achilles 2/2, plantar responses were flexor bilaterally. Gait and Station: Rising up from seated position without assistance, normal stance,  moderate stride, good arm swing, smooth turning, able to perform tiptoe, and heel walking without difficulty. Tandem gait is steady.   DIAGNOSTIC DATA (LABS, IMAGING, TESTING) - I reviewed patient records, labs, notes, testing and imaging myself where available.  Lab Results  Component Value Date   WBC 12.4 (H) 07/20/2016   HGB 10.4 (L) 07/20/2016   HCT 31.5 (L) 07/20/2016   MCV 94.9 07/20/2016   PLT 289 07/20/2016        Component Value Date/Time   NA 134 (L) 07/21/2016 0305   K 4.2 07/21/2016 0305   CL 97 (L) 07/21/2016 0305   CO2 27 07/21/2016 0305   GLUCOSE 270 (H) 07/21/2016 0305   BUN 22 (H) 07/21/2016 0305   CREATININE 1.00 07/21/2016 0305   CREATININE 1.06 (H) 01/26/2016 1608   CALCIUM 8.4 (L) 07/21/2016 0305   PROT 6.4 (L) 01/20/2016 0709   ALBUMIN 3.4 (L) 01/20/2016 0709   AST 20 01/20/2016 0709   ALT 26 01/20/2016 0709   ALKPHOS 52 01/20/2016 0709   BILITOT 1.0 01/20/2016 0709   GFRNONAA 57 (L) 07/21/2016 0305   GFRAA >60 07/21/2016 0305   Lab Results  Component Value Date   CHOL 196 07/18/2016   HDL 48 07/18/2016   LDLCALC 107 (H) 07/18/2016   TRIG 203 (H) 07/18/2016   CHOLHDL 4.1 07/18/2016   Lab Results  Component Value Date   HGBA1C 6.6 (H) 07/18/2016       ASSESSMENT AND PLAN  67 y.o. year old female  has a past medical history of Anxiety; Asthma;  Chronic diastolic CHF (congestive heart failure) ([place]);  Coronary artery disease; Depression; Diabetes mellitus; Dyslipidemia;  Hypertensive heart disease; NSTEMI (non-ST elevated myocardial infarction) ([place]) (12/2015); and Seizure disorder complex partial here to follow-up.    PLAN: Continue  Depakote 500mg  ER daily will refill after labs back We will check labs today CBC and CMP to monitor adverse effects of Depakote Valproic acid level to monitor for therapeutic level Call for any seizure activity Follow-up yearly and as needed Dennie Bible, [facility], [facility], [facility] [address], [place], [place] [address] [phone]

## 2017-05-17 ENCOUNTER — Encounter: Payer: Self-pay | Admitting: Nurse Practitioner

## 2017-05-17 ENCOUNTER — Ambulatory Visit: Payer: Medicare Other | Admitting: Nurse Practitioner

## 2017-05-17 VITALS — BP 138/72 | HR 68 | Wt 151.2 lb

## 2017-05-17 DIAGNOSIS — Z5181 Encounter for therapeutic drug level monitoring: Secondary | ICD-10-CM | POA: Diagnosis not present

## 2017-05-17 DIAGNOSIS — R569 Unspecified convulsions: Secondary | ICD-10-CM

## 2017-05-17 NOTE — Progress Notes (Signed)
I have reviewed and agreed above plan. 

## 2017-05-17 NOTE — Patient Instructions (Signed)
Continue  Depakote 500mg  ER daily will refill after labs back We will check labs today Call for any seizure activity Follow-up yearly and as needed

## 2017-05-18 ENCOUNTER — Telehealth: Payer: Self-pay | Admitting: Nurse Practitioner

## 2017-05-18 DIAGNOSIS — G40909 Epilepsy, unspecified, not intractable, without status epilepticus: Secondary | ICD-10-CM

## 2017-05-18 LAB — CBC WITH DIFFERENTIAL/PLATELET
BASOS: 1 %
Basophils Absolute: 0.1 10*3/uL (ref 0.0–0.2)
EOS (ABSOLUTE): 0.1 10*3/uL (ref 0.0–0.4)
EOS: 1 %
HEMATOCRIT: 37.3 % (ref 34.0–46.6)
Hemoglobin: 11.9 g/dL (ref 11.1–15.9)
IMMATURE GRANS (ABS): 0 10*3/uL (ref 0.0–0.1)
IMMATURE GRANULOCYTES: 0 %
LYMPHS: 28 %
Lymphocytes Absolute: 2.2 10*3/uL (ref 0.7–3.1)
MCH: 30.7 pg (ref 26.6–33.0)
MCHC: 31.9 g/dL (ref 31.5–35.7)
MCV: 96 fL (ref 79–97)
MONOCYTES: 6 %
MONOS ABS: 0.5 10*3/uL (ref 0.1–0.9)
NEUTROS PCT: 64 %
Neutrophils Absolute: 4.8 10*3/uL (ref 1.4–7.0)
Platelets: 312 10*3/uL (ref 150–379)
RBC: 3.87 x10E6/uL (ref 3.77–5.28)
RDW: 13.7 % (ref 12.3–15.4)
WBC: 7.6 10*3/uL (ref 3.4–10.8)

## 2017-05-18 LAB — COMPREHENSIVE METABOLIC PANEL
ALT: 10 IU/L (ref 0–32)
AST: 13 IU/L (ref 0–40)
Albumin/Globulin Ratio: 1.3 (ref 1.2–2.2)
Albumin: 4.3 g/dL (ref 3.6–4.8)
Alkaline Phosphatase: 64 IU/L (ref 39–117)
BUN/Creatinine Ratio: 17 (ref 12–28)
BUN: 18 mg/dL (ref 8–27)
Bilirubin Total: 0.4 mg/dL (ref 0.0–1.2)
CALCIUM: 9.4 mg/dL (ref 8.7–10.3)
CO2: 24 mmol/L (ref 20–29)
CREATININE: 1.04 mg/dL — AB (ref 0.57–1.00)
Chloride: 99 mmol/L (ref 96–106)
GFR calc Af Amer: 64 mL/min/{1.73_m2} (ref >59)
GFR, EST NON AFRICAN AMERICAN: 55 mL/min/{1.73_m2} — AB (ref >59)
GLOBULIN, TOTAL: 3.3 g/dL (ref 1.5–4.5)
Glucose: 104 mg/dL — ABNORMAL HIGH (ref 65–99)
Potassium: 4.2 mmol/L (ref 3.5–5.2)
SODIUM: 141 mmol/L (ref 134–144)
Total Protein: 7.6 g/dL (ref 6.0–8.5)

## 2017-05-18 LAB — VALPROIC ACID LEVEL: Valproic Acid Lvl: 28 ug/mL — ABNORMAL LOW (ref 50–100)

## 2017-05-18 MED ORDER — DIVALPROEX SODIUM ER 500 MG PO TB24: 1000.0000 mg | ORAL_TABLET | Freq: Every day | ORAL | 6 refills | Status: DC

## 2017-05-18 NOTE — Telephone Encounter (Signed)
Called patient and informed her that her labs are okay except the Depakote level was low.  Advised this puts her at risk for a seizure.  Advised she please increase Depakote to 2 tablets at night and come in for a repeat Depakote level in 10 days. . Advised she call 911 for any seizure activity and let this office know. Patient repeated this correctly and verbalized understanding.

## 2017-05-18 NOTE — Telephone Encounter (Signed)
Labs okay except Depakote level low.  At risk for seizure.  Please increase to 2 tablets at night and repeat level in 10 days.  Please call the patient I have sent in a new prescription.

## 2017-05-24 NOTE — Telephone Encounter (Signed)
Pt is calling to know if she will still need to come in for the 10 day Depakote level follow up. There weren't any orders sent in. Please call to discuss

## 2017-05-24 NOTE — Addendum Note (Signed)
Addended by: Minna Antis on: 05/24/2017 03:08 PM   Modules accepted: Orders

## 2017-05-24 NOTE — Telephone Encounter (Signed)
Spoke with patient and advised her she does need to come in next Monday for repeat lab, no appointment needed. Gave her office hours. She takes medication at night. Advised she'll get a call with results. She verbalized understanding of call.

## 2017-05-28 ENCOUNTER — Other Ambulatory Visit (INDEPENDENT_AMBULATORY_CARE_PROVIDER_SITE_OTHER): Payer: Self-pay

## 2017-05-28 ENCOUNTER — Other Ambulatory Visit: Payer: Self-pay | Admitting: *Deleted

## 2017-05-28 DIAGNOSIS — R569 Unspecified convulsions: Secondary | ICD-10-CM

## 2017-05-28 DIAGNOSIS — Z0289 Encounter for other administrative examinations: Secondary | ICD-10-CM

## 2017-05-29 ENCOUNTER — Other Ambulatory Visit: Payer: Self-pay | Admitting: Nurse Practitioner

## 2017-05-29 LAB — VALPROIC ACID LEVEL: VALPROIC ACID LVL: 65 ug/mL (ref 50–100)

## 2017-05-29 MED ORDER — DIVALPROEX SODIUM ER 500 MG PO TB24: 1000.0000 mg | ORAL_TABLET | Freq: Every day | ORAL | 11 refills | Status: DC

## 2017-05-30 ENCOUNTER — Telehealth: Payer: Self-pay | Admitting: *Deleted

## 2017-05-30 NOTE — Telephone Encounter (Signed)
Spoke with patient and informed her that her lab showed a good level of valproic acid. Advised her that Hoyle Sauer refilled her Depakote.  She verbalized understanding, appreciation.

## 2017-06-29 ENCOUNTER — Encounter (HOSPITAL_COMMUNITY): Payer: Self-pay

## 2017-06-29 ENCOUNTER — Other Ambulatory Visit: Payer: Self-pay

## 2017-06-29 ENCOUNTER — Emergency Department (HOSPITAL_COMMUNITY): Payer: Medicare Other

## 2017-06-29 ENCOUNTER — Emergency Department (HOSPITAL_COMMUNITY)
Admission: EM | Admit: 2017-06-29 | Discharge: 2017-06-29 | Disposition: A | Discharge status: Home / Self Care | Payer: Medicare Other | Attending: Emergency Medicine | Admitting: Emergency Medicine

## 2017-06-29 DIAGNOSIS — I2510 Atherosclerotic heart disease of native coronary artery without angina pectoris: Secondary | ICD-10-CM | POA: Diagnosis not present

## 2017-06-29 DIAGNOSIS — I130 Hypertensive heart and chronic kidney disease with heart failure and stage 1 through stage 4 chronic kidney disease, or unspecified chronic kidney disease: Secondary | ICD-10-CM | POA: Insufficient documentation

## 2017-06-29 DIAGNOSIS — Z7982 Long term (current) use of aspirin: Secondary | ICD-10-CM | POA: Diagnosis not present

## 2017-06-29 DIAGNOSIS — J4541 Moderate persistent asthma with (acute) exacerbation: Secondary | ICD-10-CM | POA: Insufficient documentation

## 2017-06-29 DIAGNOSIS — I5032 Chronic diastolic (congestive) heart failure: Secondary | ICD-10-CM | POA: Insufficient documentation

## 2017-06-29 DIAGNOSIS — Z7984 Long term (current) use of oral hypoglycemic drugs: Secondary | ICD-10-CM | POA: Diagnosis not present

## 2017-06-29 DIAGNOSIS — R062 Wheezing: Secondary | ICD-10-CM | POA: Diagnosis present

## 2017-06-29 DIAGNOSIS — E119 Type 2 diabetes mellitus without complications: Secondary | ICD-10-CM | POA: Insufficient documentation

## 2017-06-29 DIAGNOSIS — N183 Chronic kidney disease, stage 3 (moderate): Secondary | ICD-10-CM | POA: Diagnosis not present

## 2017-06-29 DIAGNOSIS — I252 Old myocardial infarction: Secondary | ICD-10-CM | POA: Diagnosis not present

## 2017-06-29 DIAGNOSIS — Z87891 Personal history of nicotine dependence: Secondary | ICD-10-CM | POA: Insufficient documentation

## 2017-06-29 LAB — I-STAT TROPONIN, ED: TROPONIN I, POC: 0 ng/mL (ref 0.00–0.08)

## 2017-06-29 MED ORDER — ALBUTEROL SULFATE (2.5 MG/3ML) 0.083% IN NEBU
5.0000 mg | INHALATION_SOLUTION | Freq: Once | RESPIRATORY_TRACT | Status: AC
  Administered 2017-06-29: 5 mg via RESPIRATORY_TRACT
  Filled 2017-06-29: qty 6

## 2017-06-29 MED ORDER — PREDNISONE 20 MG PO TABS: 60.0000 mg | ORAL_TABLET | Freq: Every day | ORAL | 0 refills | Status: DC

## 2017-06-29 MED ORDER — PREDNISONE 20 MG PO TABS
60.0000 mg | ORAL_TABLET | Freq: Once | ORAL | Status: AC
  Administered 2017-06-29: 60 mg via ORAL
  Filled 2017-06-29: qty 3

## 2017-06-29 MED ORDER — IPRATROPIUM-ALBUTEROL 0.5-2.5 (3) MG/3ML IN SOLN
3.0000 mL | Freq: Once | RESPIRATORY_TRACT | Status: AC
  Administered 2017-06-29: 3 mL via RESPIRATORY_TRACT
  Filled 2017-06-29: qty 3

## 2017-06-29 MED ORDER — ALBUTEROL SULFATE HFA 108 (90 BASE) MCG/ACT IN AERS: 2.0000 | INHALATION_SPRAY | RESPIRATORY_TRACT | 5 refills | Status: DC | PRN

## 2017-06-29 NOTE — ED Provider Notes (Signed)
[facility] EMERGENCY DEPARTMENT Provider Note   CSN: [id number] Arrival date & time: 06/29/17  0827     History   Chief Complaint Chief Complaint  Patient presents with  . asthma/wheezing    HPI [name] is a 67 y.o. female.  She states she has a history of asthma and uses a baseline inhaler and a rescue inhaler.  She is not on oxygen she is not on steroids.  She does not smoke.  Starting about 3 days ago she started with a [place] throat and then it turned into her asthma.  There is a cough dry nonproductive.  No fever.  The history is provided by the patient.  Shortness of Breath  This is a recurrent problem. The problem occurs intermittently.The current episode started more than 2 days ago. The problem has been gradually worsening. Associated symptoms include sore throat, cough and wheezing. Pertinent negatives include no fever, no coryza, no rhinorrhea, no ear pain, no sputum production, no hemoptysis, no chest pain, no vomiting, no abdominal pain, no rash, no leg pain and no claudication. She has tried beta-agonist inhalers and inhaled steroids for the symptoms. The treatment provided mild relief. Associated medical issues include asthma.    Past Medical History:  Diagnosis Date  . Anxiety   . Asthma   . Bronchitis   . Chronic diastolic CHF (congestive heart failure) ([place])   . Chronic diastolic CHF (congestive heart failure) ([place]) 07/17/2016  . Coronary artery disease    a. NSTEMI 12/2015 - LHC 01/18/16: s/p overlapping DESx2 to RCA, 10% ost-prox Cx,10% mLAD.  Marland Kitchen Depression   . Diabetes mellitus   . Dyslipidemia   . GERD (gastroesophageal reflux disease)   . Heart attack ([place])   . Hypertension   . Hypertensive heart disease   . NSTEMI (non-ST elevated myocardial infarction) ([place]) 12/2015  . Pneumonia     Patient Active Problem List   Diagnosis Date Noted  . Therapeutic drug monitoring 05/17/2017  . Lung nodule 08/01/2016  . Influenza B  07/18/2016  . Chronic diastolic CHF (congestive heart failure) ([place]) 07/17/2016  . Acute renal failure superimposed on stage 3 chronic kidney disease ([place]) 07/17/2016  . Right leg swelling 02/27/2016  . Acute respiratory failure with hypoxia ([place])   . Uncontrolled type 2 diabetes mellitus with complication ([place])   . Acute pulmonary embolism ([place])   . HLD (hyperlipidemia)   . Seizures ([place]) 01/27/2016  . Numbness 01/27/2016  . CAD in native artery 01/20/2016  . Hypertensive heart disease 01/20/2016  . NSTEMI (non-ST elevated myocardial infarction) ([place]) 01/18/2016  . Acute bronchitis with asthma with acute exacerbation 01/16/2016  . Acute hypokalemia 01/16/2016  . Hypomagnesemia 01/16/2016  . Acute respiratory failure ([place]) 01/16/2016  . Dyspnea on exertion   . Chest pain   . Asthma exacerbation 07/21/2015  . Upper airway cough syndrome vs Asthma  08/28/2014  . Diabetes mellitus type 2, controlled ([place]) 08/06/2014  . Hyperlipidemia 08/06/2014  . Allergic rhinitis 07/22/2008  . 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 DYSPNEA 07/22/2008  . Diabetes mellitus type 2, uncontrolled ([place]) 07/21/2008  . DIABETIC PERIPHERAL NEUROPATHY 07/21/2008  . Depression with anxiety 07/21/2008  . Essential hypertension 07/21/2008  . Mild persistent chronic asthma without complication 08/65/7846  . G E R D 07/21/2008  . PULMONARY EMBOLISM, HX OF 07/21/2008    Past Surgical History:  Procedure Laterality Date  . ABDOMINAL HYSTERECTOMY    . [place]  minimal LAD dz, no other dz, EF normal  . CARDIAC CATHETERIZATION N/A 01/18/2016   Procedure: Left Heart Cath and Coronary Angiography;  Surgeon: Burnell Blanks, MD;  Location: [facility];  Service: Cardiovascular;  Laterality: N/A;  . CARDIAC CATHETERIZATION N/A 01/18/2016   Procedure: Coronary Stent Intervention;  Surgeon: Burnell Blanks, MD;  Location: [facility];  Service: Cardiovascular;   Laterality: N/A;  . COLONOSCOPY WITH PROPOFOL N/A 04/09/2014   Procedure: COLONOSCOPY WITH PROPOFOL;  Surgeon: Cleotis Nipper, MD;  Location: [facility];  Service: Endoscopy;  Laterality: N/A;  . CORONARY STENT PLACEMENT  01/19/2016   STENT SYNERGY DES [id number] drug eluting stent was successfully placed, and overlaps the 2.5 x 38 mm Synergy stent placed distally.  . ESOPHAGOGASTRODUODENOSCOPY (EGD) WITH PROPOFOL N/A 04/09/2014   Procedure: ESOPHAGOGASTRODUODENOSCOPY (EGD) WITH PROPOFOL;  Surgeon: Cleotis Nipper, MD;  Location: [facility];  Service: Endoscopy;  Laterality: N/A;  . HEMORRHOID SURGERY    . HERNIA REPAIR    . KNEE ARTHROSCOPY      OB History    No data available       Home Medications    Prior to Admission medications   Medication Sig Start Date End Date Taking? Authorizing Provider  acetaminophen-codeine (TYLENOL #3) 300-30 MG tablet TAKE ONE TABLET BY MOUTH EVERY 4 HOURS AS NEEDED FOR COUGH Patient not taking: Reported on 05/17/2017 08/18/16   Tanda Rockers, MD  albuterol (PROAIR HFA) 108 (90 Base) MCG/ACT inhaler Inhale 2 puffs into the lungs every 4 (four) hours as needed for wheezing or shortness of breath. Patient not taking: Reported on 05/17/2017 08/01/16   Parrett, Fonnie Mu, NP  albuterol (PROVENTIL) (2.5 MG/3ML) 0.083% nebulizer solution Take 3 mLs (2.5 mg total) by nebulization every 4 (four) hours as needed for shortness of breath. Patient not taking: Reported on 05/17/2017 07/31/15   Theodis Blaze, MD  ALPRAZolam Duanne Moron) 0.5 MG tablet Take 1 tablet (0.5 mg total) by mouth 2 (two) times daily as needed for anxiety. 07/31/15   Theodis Blaze, MD  aspirin EC 81 MG tablet Take 81 mg by mouth daily.     [provider]  atorvastatin (LIPITOR) 40 MG tablet Take 40 mg by mouth daily.    [provider]  Biotin 5000 MCG CAPS Take 1 capsule by mouth daily.    [provider]  budesonide-formoterol (SYMBICORT) 80-4.5 MCG/ACT inhaler Inhale 2  puffs into the lungs 2 (two) times daily. 09/18/16   Tanda Rockers, MD  diltiazem (DILACOR XR) 180 MG 24 hr capsule Take 180 mg by mouth daily. 02/22/16   [provider]  divalproex (DEPAKOTE ER) 500 MG 24 hr tablet Take 2 tablets (1,000 mg total) by mouth at bedtime. 05/29/17   Dennie Bible, NP  esomeprazole (NEXIUM) 40 MG capsule Take 1 capsule (40 mg total) by mouth 2 (two) times daily before a meal. Take twice a day for 4days then resume once daily 07/21/16   Domenic Polite, MD  estradiol (ESTRACE) 1 MG tablet Take 1 mg by mouth daily.     [provider]  fluticasone (FLONASE) 50 MCG/ACT nasal spray Place 2 sprays into both nostrils daily as needed for allergies or rhinitis.    [provider]  furosemide (LASIX) 40 MG tablet Take 1 tablet (40 mg total) by mouth daily. 04/18/16 09/18/16  Eileen Stanford, PA-C  indomethacin (INDOCIN) 50 MG capsule Take 50 mg by mouth 2 (two) times  daily with a meal.    [provider]  irbesartan (AVAPRO) 300 MG tablet  02/18/17   [provider]  metFORMIN (GLUCOPHAGE) 500 MG tablet Take 1 tablet (500 mg total) by mouth 2 (two) times daily with a meal. 03/02/16   Geradine Girt, DO  metoprolol succinate (TOPROL-XL) 25 MG 24 hr tablet  02/18/17   [provider]  nitroGLYCERIN (NITROSTAT) 0.4 MG SL tablet Place 0.4 mg under the tongue every 5 (five) minutes as needed for chest pain. x3 as needed    [provider]  potassium chloride SA (K-DUR,KLOR-CON) 20 MEQ tablet Take 20 mEq by mouth daily.     [provider]  ranitidine (ZANTAC) 150 MG capsule Take 150 mg by mouth every evening.    [provider]  Respiratory Therapy Supplies (FLUTTER) DEVI Use as directed Patient not taking: Reported on 05/17/2017 02/23/16   Tanda Rockers, MD  Tiotropium Bromide Monohydrate (SPIRIVA RESPIMAT) 2.5 MCG/ACT AERS Inhale 2 puffs into the lungs daily. 08/01/16   Parrett, Fonnie Mu, NP    torsemide (DEMADEX) 20 MG tablet 20 mg. 1-2 daily for swelling 10/25/16   [provider]  traMADol (ULTRAM) 50 MG tablet 50 mg as needed. 09/29/16   [provider]  valsartan (DIOVAN) 320 MG tablet Take 1 tablet (320 mg total) by mouth daily. 01/26/16   Eileen Stanford, PA-C    Family History Family History  Problem Relation Age of Onset  . Allergies Mother   . Asthma Mother   . CAD Father 71  . Diabetes Father   . Hypertension Father   . Congestive Heart Failure Sister        died in her 42s  . CAD Sister 28    Social History Social History   Tobacco Use  . Smoking status: Former Smoker    Packs/day: 1.00    Years: 30.00    Pack years: 30.00    Types: Cigarettes    Last attempt to quit: 01/09/2004    Years since quitting: 13.4  . Smokeless tobacco: Never Used  Substance Use Topics  . Alcohol use: No    Alcohol/week: 0.0 oz  . Drug use: No     Allergies   Amlodipine besylate; Lisinopril; and Pantoprazole sodium   Review of Systems Review of Systems  Constitutional: Negative for chills and fever.  HENT: Positive for sore throat. Negative for ear pain and rhinorrhea.   Eyes: Negative for pain and visual disturbance.  Respiratory: Positive for cough, shortness of breath and wheezing. Negative for hemoptysis and sputum production.   Cardiovascular: Negative for chest pain, palpitations and claudication.  Gastrointestinal: Negative for abdominal pain and vomiting.  Genitourinary: Negative for dysuria and hematuria.  Musculoskeletal: Negative for arthralgias and back pain.  Skin: Negative for color change and rash.  Neurological: Negative for seizures and syncope.  All other systems reviewed and are negative.    Physical Exam Updated Vital Signs BP (!) 148/85   Pulse 65   Temp 97.9 F (36.6 C) (Oral)   Resp 17   LMP  (LMP Unknown)   SpO2 100%   Physical Exam  Constitutional: She appears well-developed and well-nourished. No distress.   HENT:  Head: Normocephalic and atraumatic.  Eyes: Conjunctivae are normal.  Neck: Neck supple.  Cardiovascular: Normal rate and regular rhythm.  No murmur heard. Pulmonary/Chest: Effort normal. No accessory muscle usage. No respiratory distress. She has decreased breath sounds. She has wheezes (scattered).  Abdominal:  Soft. There is no tenderness.  Musculoskeletal: She exhibits no edema, tenderness or deformity.  Neurological: She is alert.  Skin: Skin is warm and dry. Capillary refill takes less than 2 seconds.  Psychiatric: She has a normal mood and affect.  Nursing note and vitals reviewed.    ED Treatments / Results  Labs (all labs ordered are listed, but only abnormal results are displayed) Labs Reviewed  I-STAT TROPONIN, ED    EKG  EKG Interpretation  Date/Time:  Friday June 29 2017 08:34:31 EST Ventricular Rate:  83 PR Interval:  156 QRS Duration: 84 QT Interval:  388 QTC Calculation: 455 R Axis:   -2 Text Interpretation:  Normal sinus rhythm Normal ECG similar to prior  Confirmed by Aletta Edouard [phone]) on 06/29/2017 12:01:01 PM       Radiology Dg Chest 2 View  Result Date: 06/29/2017 CLINICAL DATA:  Wheezing since yesterday, cough, shortness of breath and chest pain. History of hypertension, diabetes, asthma, ex-smoker, CHF, GERD, pneumonia. EXAM: CHEST  2 VIEW COMPARISON:  Chest x-ray dated 07/17/2016. FINDINGS: Heart size and mediastinal contours are within normal limits, stable. Lungs are clear. No pleural effusion or pneumothorax seen. Mild degenerative spurring within the lower thoracic spine. No acute or suspicious osseous finding. IMPRESSION: No active cardiopulmonary disease. No evidence of pneumonia or pulmonary edema. Electronically Signed   By: Franki Cabot M.D.   On: 06/29/2017 09:17    Procedures Procedures (including critical care time)  Medications Ordered in ED Medications  albuterol (PROVENTIL) (2.5 MG/3ML) 0.083% nebulizer solution 5 mg  (5 mg Nebulization Given 06/29/17 0844)     Initial Impression / Assessment and Plan / ED Course  I have reviewed the triage vital signs and the nursing notes.  Pertinent labs & imaging results that were available during my care of the patient were reviewed by me and considered in my medical decision making (see chart for details).  Clinical Course as of Jun 30 1057  Fri Jun 29, 2017  1345 Reevaluation patient has gotten her second nebulizer treatment and the prednisone.  She feels a little better than when she came here.  She understands that we will take her off the prednisone to can and she is comfortable going home and will return if any worsening symptoms  [MB]    Clinical Course User Index [MB] Hayden Rasmussen, MD      Final Clinical Impressions(s) / ED Diagnoses   Final diagnoses:  Moderate persistent asthma with acute exacerbation    ED Discharge Orders        Ordered    albuterol [facility] HFA) 108 (90 Base) MCG/ACT inhaler  Every 4 hours PRN     06/29/17 1347    predniSONE (DELTASONE) 20 MG tablet  Daily     06/29/17 1347       Hayden Rasmussen, MD 06/30/17 1100

## 2017-06-29 NOTE — ED Triage Notes (Addendum)
Patient complains of 2-3 days of asthma exacerbation. Using inhaler and nebs with minimal to no relief. Wheezing on arrival with dry cough. States that she has CP with inspiration and cough

## 2017-06-29 NOTE — Discharge Instructions (Signed)
Your evaluated in the emergency department for an asthma attack.  Your chest x-ray and EKG were unremarkable.  You have been started on some prednisone and you will need to continue that for 4 more days.  We are also refilling your albuterol inhaler.  Please follow-up with your primary care doctor and return if any worsening symptoms.

## 2017-06-30 ENCOUNTER — Other Ambulatory Visit: Payer: Self-pay | Admitting: Internal Medicine

## 2017-07-04 ENCOUNTER — Telehealth: Payer: Self-pay | Admitting: Internal Medicine

## 2017-07-04 NOTE — Telephone Encounter (Signed)
Called and spoke with patient she states that she saw on her my chart where she was overdue for the Hep C screening. She states that she got this done a few years back at her PCP office and can have the records sent over to [place]. She was just making sure that it wasn't something that had to be done here in our office. I advised patient that most times the PCP follows this unless there is a reason for the screening here. Patient advised me that she will have her PCP office fax over the results from her screening. Nothing further needed.

## 2017-08-12 ENCOUNTER — Emergency Department (HOSPITAL_COMMUNITY): Payer: Medicare Other

## 2017-08-12 ENCOUNTER — Other Ambulatory Visit: Payer: Self-pay

## 2017-08-12 ENCOUNTER — Inpatient Hospital Stay (HOSPITAL_COMMUNITY)
Admission: EM | Admit: 2017-08-12 | Discharge: 2017-08-17 | DRG: 202 | Disposition: A | Discharge status: Home / Self Care | Payer: Medicare Other | Attending: Internal Medicine | Admitting: Internal Medicine

## 2017-08-12 ENCOUNTER — Encounter (HOSPITAL_COMMUNITY): Payer: Self-pay | Admitting: Oncology

## 2017-08-12 DIAGNOSIS — R0782 Intercostal pain: Secondary | ICD-10-CM

## 2017-08-12 DIAGNOSIS — Z7984 Long term (current) use of oral hypoglycemic drugs: Secondary | ICD-10-CM | POA: Diagnosis not present

## 2017-08-12 DIAGNOSIS — Z7982 Long term (current) use of aspirin: Secondary | ICD-10-CM

## 2017-08-12 DIAGNOSIS — J4541 Moderate persistent asthma with (acute) exacerbation: Secondary | ICD-10-CM | POA: Diagnosis not present

## 2017-08-12 DIAGNOSIS — I110 Hypertensive heart disease with heart failure: Secondary | ICD-10-CM | POA: Diagnosis present

## 2017-08-12 DIAGNOSIS — Z7952 Long term (current) use of systemic steroids: Secondary | ICD-10-CM | POA: Diagnosis not present

## 2017-08-12 DIAGNOSIS — Z833 Family history of diabetes mellitus: Secondary | ICD-10-CM | POA: Diagnosis not present

## 2017-08-12 DIAGNOSIS — E876 Hypokalemia: Secondary | ICD-10-CM | POA: Diagnosis present

## 2017-08-12 DIAGNOSIS — E1165 Type 2 diabetes mellitus with hyperglycemia: Secondary | ICD-10-CM

## 2017-08-12 DIAGNOSIS — Z825 Family history of asthma and other chronic lower respiratory diseases: Secondary | ICD-10-CM

## 2017-08-12 DIAGNOSIS — J9601 Acute respiratory failure with hypoxia: Secondary | ICD-10-CM

## 2017-08-12 DIAGNOSIS — I2510 Atherosclerotic heart disease of native coronary artery without angina pectoris: Secondary | ICD-10-CM | POA: Diagnosis present

## 2017-08-12 DIAGNOSIS — E119 Type 2 diabetes mellitus without complications: Secondary | ICD-10-CM | POA: Diagnosis present

## 2017-08-12 DIAGNOSIS — IMO0002 Reserved for concepts with insufficient information to code with codable children: Secondary | ICD-10-CM | POA: Diagnosis present

## 2017-08-12 DIAGNOSIS — N179 Acute kidney failure, unspecified: Secondary | ICD-10-CM | POA: Diagnosis present

## 2017-08-12 DIAGNOSIS — J45901 Unspecified asthma with (acute) exacerbation: Secondary | ICD-10-CM | POA: Diagnosis present

## 2017-08-12 DIAGNOSIS — R0600 Dyspnea, unspecified: Secondary | ICD-10-CM | POA: Diagnosis not present

## 2017-08-12 DIAGNOSIS — Z90710 Acquired absence of both cervix and uterus: Secondary | ICD-10-CM | POA: Diagnosis not present

## 2017-08-12 DIAGNOSIS — K219 Gastro-esophageal reflux disease without esophagitis: Secondary | ICD-10-CM | POA: Diagnosis present

## 2017-08-12 DIAGNOSIS — D649 Anemia, unspecified: Secondary | ICD-10-CM | POA: Diagnosis present

## 2017-08-12 DIAGNOSIS — Z7951 Long term (current) use of inhaled steroids: Secondary | ICD-10-CM | POA: Diagnosis not present

## 2017-08-12 DIAGNOSIS — Z87891 Personal history of nicotine dependence: Secondary | ICD-10-CM | POA: Diagnosis not present

## 2017-08-12 DIAGNOSIS — I10 Essential (primary) hypertension: Secondary | ICD-10-CM | POA: Diagnosis not present

## 2017-08-12 DIAGNOSIS — E785 Hyperlipidemia, unspecified: Secondary | ICD-10-CM | POA: Diagnosis present

## 2017-08-12 DIAGNOSIS — Z8249 Family history of ischemic heart disease and other diseases of the circulatory system: Secondary | ICD-10-CM

## 2017-08-12 DIAGNOSIS — Z79899 Other long term (current) drug therapy: Secondary | ICD-10-CM

## 2017-08-12 DIAGNOSIS — I5033 Acute on chronic diastolic (congestive) heart failure: Secondary | ICD-10-CM

## 2017-08-12 DIAGNOSIS — I252 Old myocardial infarction: Secondary | ICD-10-CM | POA: Diagnosis not present

## 2017-08-12 DIAGNOSIS — I5032 Chronic diastolic (congestive) heart failure: Secondary | ICD-10-CM | POA: Diagnosis present

## 2017-08-12 DIAGNOSIS — R072 Precordial pain: Secondary | ICD-10-CM | POA: Diagnosis not present

## 2017-08-12 DIAGNOSIS — Z955 Presence of coronary angioplasty implant and graft: Secondary | ICD-10-CM | POA: Diagnosis not present

## 2017-08-12 DIAGNOSIS — D539 Nutritional anemia, unspecified: Secondary | ICD-10-CM

## 2017-08-12 DIAGNOSIS — I361 Nonrheumatic tricuspid (valve) insufficiency: Secondary | ICD-10-CM | POA: Diagnosis not present

## 2017-08-12 LAB — CBC WITH DIFFERENTIAL/PLATELET
BASOS PCT: 1 %
Basophils Absolute: 0.1 10*3/uL (ref 0.0–0.1)
Eosinophils Absolute: 0.2 10*3/uL (ref 0.0–0.7)
Eosinophils Relative: 2 %
HCT: 34.3 % — ABNORMAL LOW (ref 36.0–46.0)
HEMOGLOBIN: 11.2 g/dL — AB (ref 12.0–15.0)
LYMPHS ABS: 3.8 10*3/uL (ref 0.7–4.0)
Lymphocytes Relative: 38 %
MCH: 31.9 pg (ref 26.0–34.0)
MCHC: 32.7 g/dL (ref 30.0–36.0)
MCV: 97.7 fL (ref 78.0–100.0)
MONO ABS: 0.7 10*3/uL (ref 0.1–1.0)
MONOS PCT: 7 %
NEUTROS PCT: 52 %
Neutro Abs: 5.2 10*3/uL (ref 1.7–7.7)
Platelets: 311 10*3/uL (ref 150–400)
RBC: 3.51 MIL/uL — ABNORMAL LOW (ref 3.87–5.11)
RDW: 14.4 % (ref 11.5–15.5)
WBC: 9.9 10*3/uL (ref 4.0–10.5)

## 2017-08-12 LAB — GLUCOSE, CAPILLARY: Glucose-Capillary: 206 mg/dL — ABNORMAL HIGH (ref 65–99)

## 2017-08-12 LAB — I-STAT ARTERIAL BLOOD GAS, ED
Acid-Base Excess: 1 mmol/L (ref 0.0–2.0)
Bicarbonate: 26.8 mmol/L (ref 20.0–28.0)
O2 Saturation: 99 %
Patient temperature: 98.4
TCO2: 28 mmol/L (ref 22–32)
pCO2 arterial: 45 mmHg (ref 32.0–48.0)
pH, Arterial: 7.382 (ref 7.350–7.450)
pO2, Arterial: 163 mmHg — ABNORMAL HIGH (ref 83.0–108.0)

## 2017-08-12 LAB — BASIC METABOLIC PANEL
Anion gap: 11 (ref 5–15)
BUN: 16 mg/dL (ref 6–20)
CALCIUM: 8.3 mg/dL — AB (ref 8.9–10.3)
CHLORIDE: 105 mmol/L (ref 101–111)
CO2: 22 mmol/L (ref 22–32)
CREATININE: 0.83 mg/dL (ref 0.44–1.00)
GFR calc non Af Amer: >60 mL/min (ref >60)
Glucose, Bld: 118 mg/dL — ABNORMAL HIGH (ref 65–99)
Potassium: 3.2 mmol/L — ABNORMAL LOW (ref 3.5–5.1)
Sodium: 138 mmol/L (ref 135–145)

## 2017-08-12 LAB — TROPONIN I
Troponin I: <0.03 ng/mL
Troponin I: <0.03 ng/mL (ref <0.03)
Troponin I: <0.03 ng/mL (ref <0.03)

## 2017-08-12 LAB — BRAIN NATRIURETIC PEPTIDE: B Natriuretic Peptide: 46.4 pg/mL (ref 0.0–100.0)

## 2017-08-12 MED ORDER — MAGNESIUM OXIDE 400 (241.3 MG) MG PO TABS
400.0000 mg | ORAL_TABLET | Freq: Every day | ORAL | Status: AC
  Administered 2017-08-12 – 2017-08-13 (×2): 400 mg via ORAL
  Filled 2017-08-12 (×3): qty 1

## 2017-08-12 MED ORDER — DILTIAZEM HCL ER COATED BEADS 180 MG PO CP24
180.0000 mg | ORAL_CAPSULE | Freq: Every day | ORAL | Status: DC
  Administered 2017-08-12 – 2017-08-14 (×3): 180 mg via ORAL
  Filled 2017-08-12 (×5): qty 1

## 2017-08-12 MED ORDER — NITROGLYCERIN IN D5W 200-5 MCG/ML-% IV SOLN
0.0000 ug/min | Freq: Once | INTRAVENOUS | Status: AC
  Administered 2017-08-12: 5 ug/min via INTRAVENOUS
  Filled 2017-08-12: qty 250

## 2017-08-12 MED ORDER — MAGNESIUM SULFATE IN D5W 1-5 GM/100ML-% IV SOLN
1.0000 g | Freq: Once | INTRAVENOUS | Status: DC
  Filled 2017-08-12: qty 100

## 2017-08-12 MED ORDER — ENOXAPARIN SODIUM 40 MG/0.4ML ~~LOC~~ SOLN
40.0000 mg | SUBCUTANEOUS | Status: DC
  Administered 2017-08-12 – 2017-08-16 (×5): 40 mg via SUBCUTANEOUS
  Filled 2017-08-12 (×6): qty 0.4

## 2017-08-12 MED ORDER — METHOCARBAMOL 500 MG PO TABS
500.0000 mg | ORAL_TABLET | Freq: Four times a day (QID) | ORAL | Status: DC | PRN
  Administered 2017-08-12 – 2017-08-15 (×3): 500 mg via ORAL
  Filled 2017-08-12 (×4): qty 1

## 2017-08-12 MED ORDER — FLUTICASONE PROPIONATE 50 MCG/ACT NA SUSP
2.0000 | Freq: Every day | NASAL | Status: DC | PRN
  Filled 2017-08-12: qty 16

## 2017-08-12 MED ORDER — POTASSIUM CHLORIDE 10 MEQ/100ML IV SOLN
10.0000 meq | INTRAVENOUS | Status: AC
  Administered 2017-08-12: 10 meq via INTRAVENOUS
  Filled 2017-08-12: qty 100

## 2017-08-12 MED ORDER — ALPRAZOLAM 0.5 MG PO TABS
0.5000 mg | ORAL_TABLET | Freq: Two times a day (BID) | ORAL | Status: DC | PRN
  Administered 2017-08-12 – 2017-08-16 (×5): 0.5 mg via ORAL
  Filled 2017-08-12 (×5): qty 1

## 2017-08-12 MED ORDER — POTASSIUM CHLORIDE CRYS ER 20 MEQ PO TBCR
20.0000 meq | EXTENDED_RELEASE_TABLET | Freq: Once | ORAL | Status: DC
  Filled 2017-08-12: qty 1

## 2017-08-12 MED ORDER — METHOCARBAMOL 500 MG PO TABS
500.0000 mg | ORAL_TABLET | Freq: Four times a day (QID) | ORAL | Status: DC | PRN
  Filled 2017-08-12: qty 1

## 2017-08-12 MED ORDER — SODIUM CHLORIDE 0.9 % IV SOLN: 250.0000 mL | INTRAVENOUS | Status: DC | PRN

## 2017-08-12 MED ORDER — ACETAMINOPHEN 325 MG PO TABS
650.0000 mg | ORAL_TABLET | Freq: Four times a day (QID) | ORAL | Status: DC | PRN
  Administered 2017-08-12: 650 mg via ORAL
  Filled 2017-08-12: qty 2

## 2017-08-12 MED ORDER — FAMOTIDINE 20 MG PO TABS
10.0000 mg | ORAL_TABLET | Freq: Every day | ORAL | Status: DC
  Administered 2017-08-12 – 2017-08-16 (×5): 10 mg via ORAL
  Filled 2017-08-12 (×5): qty 1

## 2017-08-12 MED ORDER — METHYLPREDNISOLONE SODIUM SUCC 125 MG IJ SOLR
80.0000 mg | Freq: Three times a day (TID) | INTRAMUSCULAR | Status: DC
  Administered 2017-08-12 – 2017-08-13 (×3): 80 mg via INTRAVENOUS
  Filled 2017-08-12 (×3): qty 2

## 2017-08-12 MED ORDER — ACETAMINOPHEN 650 MG RE SUPP: 650.0000 mg | Freq: Four times a day (QID) | RECTAL | Status: DC | PRN

## 2017-08-12 MED ORDER — DIVALPROEX SODIUM ER 500 MG PO TB24
1000.0000 mg | ORAL_TABLET | Freq: Every day | ORAL | Status: DC
  Administered 2017-08-12 – 2017-08-16 (×5): 1000 mg via ORAL
  Filled 2017-08-12 (×7): qty 2

## 2017-08-12 MED ORDER — SODIUM CHLORIDE 0.9% FLUSH
3.0000 mL | Freq: Two times a day (BID) | INTRAVENOUS | Status: DC
  Administered 2017-08-12 – 2017-08-17 (×9): 3 mL via INTRAVENOUS

## 2017-08-12 MED ORDER — ATORVASTATIN CALCIUM 40 MG PO TABS
40.0000 mg | ORAL_TABLET | Freq: Every day | ORAL | Status: DC
  Administered 2017-08-12 – 2017-08-17 (×6): 40 mg via ORAL
  Filled 2017-08-12 (×7): qty 1

## 2017-08-12 MED ORDER — METHOCARBAMOL 1000 MG/10ML IJ SOLN
500.0000 mg | Freq: Four times a day (QID) | INTRAMUSCULAR | Status: DC | PRN
  Filled 2017-08-12: qty 5

## 2017-08-12 MED ORDER — PANTOPRAZOLE SODIUM 40 MG PO TBEC: 40.0000 mg | DELAYED_RELEASE_TABLET | Freq: Every day | ORAL | Status: DC

## 2017-08-12 MED ORDER — TORSEMIDE 20 MG PO TABS
20.0000 mg | ORAL_TABLET | Freq: Every day | ORAL | Status: DC
  Administered 2017-08-12 – 2017-08-17 (×6): 20 mg via ORAL
  Filled 2017-08-12 (×7): qty 1

## 2017-08-12 MED ORDER — TRAMADOL HCL 50 MG PO TABS
50.0000 mg | ORAL_TABLET | Freq: Four times a day (QID) | ORAL | Status: DC | PRN
  Administered 2017-08-13 – 2017-08-17 (×3): 50 mg via ORAL
  Filled 2017-08-12 (×4): qty 1

## 2017-08-12 MED ORDER — MOMETASONE FURO-FORMOTEROL FUM 100-5 MCG/ACT IN AERO
2.0000 | INHALATION_SPRAY | Freq: Two times a day (BID) | RESPIRATORY_TRACT | Status: DC
  Administered 2017-08-12 – 2017-08-17 (×10): 2 via RESPIRATORY_TRACT
  Filled 2017-08-12 (×2): qty 8.8

## 2017-08-12 MED ORDER — ALBUTEROL SULFATE (2.5 MG/3ML) 0.083% IN NEBU
2.5000 mg | INHALATION_SOLUTION | Freq: Four times a day (QID) | RESPIRATORY_TRACT | Status: DC | PRN
  Filled 2017-08-12: qty 3

## 2017-08-12 MED ORDER — SODIUM CHLORIDE 0.9% FLUSH
3.0000 mL | INTRAVENOUS | Status: DC | PRN
  Administered 2017-08-12: 3 mL via INTRAVENOUS
  Filled 2017-08-12: qty 3

## 2017-08-12 MED ORDER — KETOROLAC TROMETHAMINE 30 MG/ML IJ SOLN
15.0000 mg | Freq: Once | INTRAMUSCULAR | Status: AC
  Administered 2017-08-12: 15 mg via INTRAVENOUS
  Filled 2017-08-12: qty 1

## 2017-08-12 MED ORDER — ALBUTEROL SULFATE (2.5 MG/3ML) 0.083% IN NEBU
2.5000 mg | INHALATION_SOLUTION | Freq: Four times a day (QID) | RESPIRATORY_TRACT | Status: DC
  Administered 2017-08-12 – 2017-08-13 (×2): 2.5 mg via RESPIRATORY_TRACT
  Filled 2017-08-12 (×3): qty 3

## 2017-08-12 MED ORDER — MAGNESIUM OXIDE 400 (241.3 MG) MG PO TABS
400.0000 mg | ORAL_TABLET | Freq: Every day | ORAL | Status: DC
  Filled 2017-08-12: qty 1

## 2017-08-12 MED ORDER — IPRATROPIUM-ALBUTEROL 0.5-2.5 (3) MG/3ML IN SOLN
3.0000 mL | Freq: Once | RESPIRATORY_TRACT | Status: AC
  Administered 2017-08-12: 3 mL via RESPIRATORY_TRACT
  Filled 2017-08-12: qty 3

## 2017-08-12 MED ORDER — POTASSIUM CHLORIDE 10 MEQ/100ML IV SOLN: 10.0000 meq | INTRAVENOUS | Status: DC

## 2017-08-12 MED ORDER — METFORMIN HCL 500 MG PO TABS
500.0000 mg | ORAL_TABLET | Freq: Two times a day (BID) | ORAL | Status: DC
  Administered 2017-08-12 – 2017-08-17 (×9): 500 mg via ORAL
  Filled 2017-08-12 (×10): qty 1

## 2017-08-12 MED ORDER — FUROSEMIDE 10 MG/ML IJ SOLN
40.0000 mg | Freq: Once | INTRAMUSCULAR | Status: AC
  Administered 2017-08-12: 40 mg via INTRAVENOUS
  Filled 2017-08-12: qty 4

## 2017-08-12 MED ORDER — METOPROLOL SUCCINATE ER 25 MG PO TB24
25.0000 mg | ORAL_TABLET | Freq: Every day | ORAL | Status: DC
  Administered 2017-08-12 – 2017-08-13 (×2): 25 mg via ORAL
  Filled 2017-08-12 (×3): qty 1

## 2017-08-12 MED ORDER — ESTRADIOL 1 MG PO TABS
1.0000 mg | ORAL_TABLET | Freq: Every day | ORAL | Status: DC
  Administered 2017-08-12 – 2017-08-17 (×6): 1 mg via ORAL
  Filled 2017-08-12 (×6): qty 1

## 2017-08-12 MED ORDER — TIOTROPIUM BROMIDE MONOHYDRATE 18 MCG IN CAPS
1.0000 | ORAL_CAPSULE | Freq: Every day | RESPIRATORY_TRACT | Status: DC
  Filled 2017-08-12 (×2): qty 5

## 2017-08-12 MED ORDER — POTASSIUM CHLORIDE CRYS ER 20 MEQ PO TBCR
20.0000 meq | EXTENDED_RELEASE_TABLET | Freq: Every day | ORAL | Status: DC
  Administered 2017-08-12 – 2017-08-17 (×6): 20 meq via ORAL
  Filled 2017-08-12 (×6): qty 1

## 2017-08-12 MED ORDER — ASPIRIN EC 81 MG PO TBEC
81.0000 mg | DELAYED_RELEASE_TABLET | Freq: Every day | ORAL | Status: DC
  Administered 2017-08-12 – 2017-08-17 (×6): 81 mg via ORAL
  Filled 2017-08-12 (×7): qty 1

## 2017-08-12 MED ORDER — IRBESARTAN 300 MG PO TABS
300.0000 mg | ORAL_TABLET | Freq: Every day | ORAL | Status: DC
  Administered 2017-08-12 – 2017-08-17 (×6): 300 mg via ORAL
  Filled 2017-08-12 (×8): qty 1

## 2017-08-12 NOTE — ED Triage Notes (Signed)
May take off Bi-PAP, DR Maudie Mercury

## 2017-08-12 NOTE — Consult Note (Addendum)
Cardiology Consultation:   Patient ID: [name]; [id number]; Apr 13, 1951   Admit date: 08/12/2017 Date of Consult: 08/12/2017  Primary Care Provider: [name], MD Primary Cardiologist: Candee Furbish, MD  Primary Electrophysiologist:  N/A   Patient Profile:   [name] is a 67 y.o. female with a hx of  recurrent asthma, CAD s/p DES x2 to mid to distal RCA in September 8676, chronic diastolic heart failure, hypertension, hyperlipidemia and DM 2 who is being seen today for the evaluation of chest pain at the request of Dr. Maudie Mercury.  History of Present Illness:   [name] is a pleasant 67 year old female with past medical history of recurrent asthma, CAD s/p DES x2 to mid to distal RCA in September 7209, chronic diastolic heart failure, hypertension, hyperlipidemia and DM 2.  Patient does not remember her previous anginal symptom in 2017.  She works in Nurse, children's.  Her last office visit with Dr. Marlou Porch was on 08/11/2016, she was doing okay at the time.  In the past 6 months, she has had at least 3 episodes of recurrent asthma exacerbation.  The latest episode started 3-4 days ago.  She eventually sought medical attention at [facility] on 08/12/2017 and was admitted to internal medicine service.  She complained of 2 types of chest pain, the first type is substernal chest discomfort that only occurs with cough and clearly pleuritic in nature.  There was a second type of chest pain that has been occurring in the past 3-4 days under her left breast.  It is not associated with shortness of breath, and it does not exacerbate with ambulation.  There is no exacerbating factors such as deep inspiration, body rotation or palpation.  Cardiology has been consulted for chest pain.  She says the chest pain under her left breast come and goes and does not last long.  She has not taken any nitroglycerin for it.  First troponin is negative.  BNP is 46.4.  Potassium level low at 3.2.   Hemoglobin 11.2.  Chest x-ray showed bibasilar atelectasis.  EKG showed sinus rhythm with no significant ST-T wave changes.   Past Medical History:  Diagnosis Date  . Anxiety   . Asthma   . Bronchitis   . Chronic diastolic CHF (congestive heart failure) ([place])   . Chronic diastolic CHF (congestive heart failure) ([place]) 07/17/2016  . Coronary artery disease    a. NSTEMI 12/2015 - [facility] 01/18/16: s/p overlapping DESx2 to RCA, 10% ost-prox Cx,10% mLAD.  Marland Kitchen Depression   . Diabetes mellitus   . Dyslipidemia   . GERD (gastroesophageal reflux disease)   . Heart attack ([place])   . Hypertension   . Hypertensive heart disease   . NSTEMI (non-ST elevated myocardial infarction) ([place]) 12/2015  . Pneumonia     Past Surgical History:  Procedure Laterality Date  . ABDOMINAL HYSTERECTOMY    . CARDIAC CATHETERIZATION  1994   minimal LAD dz, no other dz, EF normal  . CARDIAC CATHETERIZATION N/A 01/18/2016   Procedure: Left Heart Cath and Coronary Angiography;  Surgeon: Burnell Blanks, MD;  Location: [facility];  Service: Cardiovascular;  Laterality: N/A;  . CARDIAC CATHETERIZATION N/A 01/18/2016   Procedure: Coronary Stent Intervention;  Surgeon: Burnell Blanks, MD;  Location: [facility];  Service: Cardiovascular;  Laterality: N/A;  . COLONOSCOPY WITH PROPOFOL N/A 04/09/2014   Procedure: COLONOSCOPY WITH PROPOFOL;  Surgeon: Cleotis Nipper, MD;  Location: [facility];  Service: Endoscopy;  Laterality: N/A;  . CORONARY STENT PLACEMENT  01/19/2016   STENT SYNERGY DES 9.32T55 drug eluting stent was successfully placed, and overlaps the 2.5 x 38 mm Synergy stent placed distally.  . ESOPHAGOGASTRODUODENOSCOPY (EGD) WITH PROPOFOL N/A 04/09/2014   Procedure: ESOPHAGOGASTRODUODENOSCOPY (EGD) WITH PROPOFOL;  Surgeon: Cleotis Nipper, MD;  Location: [facility];  Service: Endoscopy;  Laterality: N/A;  . HEMORRHOID SURGERY    . HERNIA REPAIR    . KNEE ARTHROSCOPY       Home  Medications:  Prior to Admission medications   Medication Sig Start Date End Date Taking? Authorizing Provider  albuterol (PROAIR HFA) 108 (90 Base) MCG/ACT inhaler Inhale 2 puffs into the lungs every 4 (four) hours as needed for wheezing or shortness of breath. 06/29/17  Yes Hayden Rasmussen, MD  albuterol (PROVENTIL) (2.5 MG/3ML) 0.083% nebulizer solution Take 3 mLs (2.5 mg total) by nebulization every 4 (four) hours as needed for shortness of breath. 07/31/15  Yes Theodis Blaze, MD  ALPRAZolam Duanne Moron) 0.5 MG tablet Take 1 tablet (0.5 mg total) by mouth 2 (two) times daily as needed for anxiety. 07/31/15  Yes Theodis Blaze, MD  aspirin EC 81 MG tablet Take 81 mg by mouth daily.    Yes [provider]  atorvastatin (LIPITOR) 40 MG tablet Take 40 mg by mouth daily.   Yes [provider]  Biotin 5000 MCG CAPS Take 1 capsule by mouth daily.   Yes [provider]  budesonide-formoterol (SYMBICORT) 80-4.5 MCG/ACT inhaler Inhale 2 puffs into the lungs 2 (two) times daily. 09/18/16  Yes Tanda Rockers, MD  diltiazem (DILACOR XR) 180 MG 24 hr capsule Take 180 mg by mouth daily. 02/22/16  Yes [provider]  divalproex (DEPAKOTE ER) 500 MG 24 hr tablet Take 2 tablets (1,000 mg total) by mouth at bedtime. 05/29/17  Yes Dennie Bible, NP  esomeprazole (NEXIUM) 40 MG capsule Take 1 capsule (40 mg total) by mouth 2 (two) times daily before a meal. Take twice a day for 4days then resume once daily 07/21/16  Yes Domenic Polite, MD  estradiol (ESTRACE) 1 MG tablet Take 1 mg by mouth daily.    Yes [provider]  fluticasone (FLONASE) 50 MCG/ACT nasal spray Place 2 sprays into both nostrils daily as needed for allergies or rhinitis.   Yes [provider]  irbesartan (AVAPRO) 300 MG tablet  02/18/17  Yes [provider]  metFORMIN (GLUCOPHAGE) 500 MG tablet Take 1 tablet (500 mg total) by mouth 2 (two) times daily with a meal. 03/02/16  Yes Eliseo Squires,  Jessica U, DO  metoprolol succinate (TOPROL-XL) 25 MG 24 hr tablet  02/18/17  Yes [provider]  nitroGLYCERIN (NITROSTAT) 0.4 MG SL tablet Place 0.4 mg under the tongue every 5 (five) minutes as needed for chest pain. x3 as needed   Yes [provider]  potassium chloride SA (K-DUR,KLOR-CON) 20 MEQ tablet Take 20 mEq by mouth daily.    Yes [provider]  ranitidine (ZANTAC) 150 MG capsule Take 150 mg by mouth every evening.   Yes [provider]  Tiotropium Bromide Monohydrate (SPIRIVA RESPIMAT) 2.5 MCG/ACT AERS Inhale 2 puffs into the lungs daily. 08/01/16  Yes Parrett, [name] S, NP  torsemide (DEMADEX) 20 MG tablet 20 mg. 1-2 daily for swelling 10/25/16  Yes [provider]  traMADol (ULTRAM) 50 MG tablet 50 mg as needed. 09/29/16  Yes [provider]  furosemide (LASIX) 40 MG tablet Take 1  tablet (40 mg total) by mouth daily. 04/18/16 09/18/16  Eileen Stanford, PA-C  Respiratory Therapy Supplies (FLUTTER) DEVI Use as directed Patient not taking: Reported on 05/17/2017 02/23/16   Tanda Rockers, MD  valsartan (DIOVAN) 320 MG tablet Take 1 tablet (320 mg total) by mouth daily. Patient not taking: Reported on 08/12/2017 01/26/16   Eileen Stanford, PA-C    Inpatient Medications: Scheduled Meds:  Continuous Infusions: . magnesium sulfate 1 - 4 g bolus IVPB    . methocarbamol (ROBAXIN)  IV    . potassium chloride     PRN Meds: methocarbamol (ROBAXIN)  IV  Allergies:    Allergies  Allergen Reactions  . Amlodipine Besylate Other (See Comments)    Reaction unknown  . Lisinopril Other (See Comments)    Reaction unknown  . Pantoprazole Sodium Other (See Comments)    Reaction unknown    Social History:   Social History   Socioeconomic History  . Marital status: Married    Spouse name: Not on file  . Number of children: 3  . Years of education: some college  . Highest education level: Not on file  Occupational History  .  Occupation: Education officer, community  . Financial resource strain: Not on file  . Food insecurity:    Worry: Not on file    Inability: Not on file  . Transportation needs:    Medical: Not on file    Non-medical: Not on file  Tobacco Use  . Smoking status: Former Smoker    Packs/day: 1.00    Years: 30.00    Pack years: 30.00    Types: Cigarettes    Last attempt to quit: 01/09/2004    Years since quitting: 13.6  . Smokeless tobacco: Never Used  Substance and Sexual Activity  . Alcohol use: No    Alcohol/week: 0.0 oz  . Drug use: No  . Sexual activity: Not on file  Lifestyle  . Physical activity:    Days per week: Not on file    Minutes per session: Not on file  . Stress: Not on file  Relationships  . Social connections:    Talks on phone: Not on file    Gets together: Not on file    Attends religious service: Not on file    Active member of club or organization: Not on file    Attends meetings of clubs or organizations: Not on file    Relationship status: Not on file  . Intimate partner violence:    Fear of current or ex partner: Not on file    Emotionally abused: Not on file    Physically abused: Not on file    Forced sexual activity: Not on file  Other Topics Concern  . Not on file  Social History Narrative   Pt lives with husband in [place].   Right-handed.   2 cups caffeine per day.    Family History:    Family History  Problem Relation Age of Onset  . Allergies Mother   . Asthma Mother   . CAD Father 49  . Diabetes Father   . Hypertension Father   . Congestive Heart Failure Sister        died in her 61s  . CAD Sister 44     ROS:  Please see the history of present illness.   All other ROS reviewed and negative.     Physical Exam/Data:   Vitals:   08/12/17 0900 08/12/17 1000 08/12/17 1030  08/12/17 1100  BP: (!) 178/88 (!) 163/96 (!) 160/89 (!) 166/83  Pulse: 76 81 78 78  Resp: 15 17 18 19   Temp:      TempSrc:      SpO2: 100% 100% 100%  100%  Weight:      Height:        Intake/Output Summary (Last 24 hours) at 08/12/2017 1244 Last data filed at 08/12/2017 0921 Gross per 24 hour  Intake -  Output 1800 ml  Net -1800 ml   Filed Weights   08/12/17 0537  Weight: 155 lb (70.3 kg)   Body mass index is 28.35 kg/m.  General:  Well nourished, well developed, in no acute distress HEENT: normal Lymph: no adenopathy Neck: no JVD Endocrine:  No thryomegaly Vascular: No carotid bruits; FA pulses 2+ bilaterally without bruits  Cardiac:  normal S1, S2; RRR; no murmur  Lungs:  clear to auscultation bilaterally, no wheezing, rhonchi or rales  Abd: soft, nontender, no hepatomegaly  Ext: no edema Musculoskeletal:  No deformities, BUE and BLE strength normal and equal Skin: warm and dry  Neuro:  CNs 2-12 intact, no focal abnormalities noted Psych:  Normal affect   EKG:  The EKG was personally reviewed and demonstrates: Normal sinus rhythm without significant ST-T wave changes Telemetry:  Telemetry was personally reviewed and demonstrates: Normal sinus rhythm, no ventricular ectopy  Relevant CV Studies:  Echo 01/18/2016 LV EF: 65% -   70% Study Conclusions  - Left ventricle: The cavity size was normal. Septal wall thickness   was increased in a pattern of severe LVH with mild hypertrophy of   the posterior wall. There was severe concentric hypertrophy.   Systolic function was vigorous. The estimated ejection fraction   was in the range of 65% to 70%. Wall motion was normal; there   were no regional wall motion abnormalities. Features are   consistent with a pseudonormal left ventricular filling pattern,   with concomitant abnormal relaxation and increased filling   pressure (grade 2 diastolic dysfunction). Doppler parameters are   consistent with high ventricular filling pressure. - Aortic valve: Transvalvular velocity was within the normal range.   There was no stenosis. There was no regurgitation. - Mitral valve:  Transvalvular velocity was within the normal range.   There was no evidence for stenosis. There was mild regurgitation. - Right ventricle: The cavity size was normal. Wall thickness was   normal. Systolic function was normal. - Tricuspid valve: There was trivial regurgitation. - Pulmonary arteries: Systolic pressure was within the normal   range.   Cath 01/18/2016 Conclusion     Mid RCA lesion, 70 %stenosed.  Dist RCA lesion, 90 %stenosed.  A STENT SYNERGY DES [id number] drug eluting stent was successfully placed, and overlaps the 2.5 x 38 mm Synergy stent placed distally.  Prox RCA lesion, 80 %stenosed.  Post intervention, there is a 0% residual stenosis.  Ost Cx to Prox Cx lesion, 10 %stenosed.  Mid LAD lesion, 10 %stenosed.   1. NSTEMI 2. Severe single vessel CAD with severe stenosis in the mid and distal RCA 3. Successful PTCA/DES x 2 mid and distal RCA  Recommendations: Dual anti-platelet therapy with ASA and Brilinta for one year. Continue statin.      Laboratory Data:  Chemistry Recent Labs  Lab 08/12/17 0544  NA 138  K 3.2*  CL 105  CO2 22  GLUCOSE 118*  BUN 16  CREATININE 0.83  CALCIUM 8.3*  GFRNONAA >60  GFRAA >60  ANIONGAP  11    No results for input(s): PROT, ALBUMIN, AST, ALT, ALKPHOS, BILITOT in the last 168 hours. Hematology Recent Labs  Lab 08/12/17 0544  WBC 9.9  RBC 3.51*  HGB 11.2*  HCT 34.3*  MCV 97.7  MCH 31.9  MCHC 32.7  RDW 14.4  PLT 311   Cardiac Enzymes Recent Labs  Lab 08/12/17 0544  TROPONINI <0.03   No results for input(s): TROPIPOC in the last 168 hours.  BNP Recent Labs  Lab 08/12/17 0544  BNP 46.4    DDimer No results for input(s): DDIMER in the last 168 hours.  Radiology/Studies:  Dg Chest Portable 1 View  Result Date: 08/12/2017 CLINICAL DATA:  Shortness of breath tonight. On bypass. History of CHF, former smoker. EXAM: PORTABLE CHEST 1 VIEW COMPARISON:  Chest radiograph July 30, 2017 FINDINGS: Cardiac  silhouette is mildly enlarged and unchanged. Bandlike density in the lung bases. No pleural effusion or focal consolidation. No pneumothorax. Soft tissue planes and included osseous structures are nonsuspicious. IMPRESSION: Mild cardiomegaly and bibasilar atelectasis. Electronically Signed   By: Elon Alas M.D.   On: 08/12/2017 06:06    Assessment and Plan:   1. Chest pain: She has been having chest pain for the past 3-4 days, it is not associated with exertion.  There is no EKG changes.  Troponin is negative.  Recommend treat asthma first, potentially consider outpatient stress test once asthma improved.  If she does have recurrent chest pain after asthma treatment, may consider inpatient stress test as well.  Contraindicated to have [place] stress test with his acute asthma exacerbation due to potential worsening breathing issues on Lexiscan.  2. Asthma exacerbation: Currently on BiPAP, she apparently has recurrent asthma exacerbation.  This is at least the third time in the past 6 month.  May consider switching Toprol-XL to bisoprolol.  3. CAD s/p DES x2 to mid to distal RCA in September 2017: Continue aspirin and statin  4. chronic diastolic heart failure: Appears to be euvolemic on physical exam.  BMP normal  5. Hypertension: Blood pressure is uncontrolled.  Restart home medication, uptitrate based on blood pressure changes.  6. hyperlipidemia: On 40 mg daily of Lipitor  7. DM 2: Managed by primary service.   For questions or updates, please contact [facility] Please consult [email or web address] for contact info under Cardiology/STEMI.   Hilbert Corrigan, [place]  08/12/2017 12:44 PM  I have seen, examined the patient, and reviewed the above assessment and plan.  Changes to above are made where necessary.  On exam, RRR.  + very prominent expiratory wheezes. Atypical chest pain with negative cardiac markers and no ekg changes. Stop IV nitro. At this time, I would advise medical  management of her asthma and then reassessment.  If she does not rule in for MI and has no ekg changes, could be followed up in the office by Dr Marlou Porch for further CV risk stratification if indicated at that time.  Co Sign: Thompson Grayer, MD 08/12/2017 1:25 PM

## 2017-08-12 NOTE — ED Provider Notes (Signed)
[facility] EMERGENCY DEPARTMENT Provider Note   CSN: [id number] Arrival date & time: 08/12/17  0532     History   Chief Complaint Chief Complaint  Patient presents with  . Shortness of Breath    HPI [name] is a 67 y.o. female.  Level 5 caveat for respiratory distress.  Patient from home with difficulty breathing onset last night became worse throughout the course of the night.  She does have a history of asthma, diastolic heart failure, CAD status post stents.  She denies chest pain.  She is given nebulizers, site Medrol magnesium and placed on CPAP by EMS.  She reports mild improvement.  She has left-sided chest pain that is worse with palpation and coughing.  She states compliance with her medications.  Does not take any diuretics at home.  Denies any abdominal pain, nausea, vomiting or diarrhea.  Denies any leg swelling.  The history is provided by the patient and the EMS personnel.  Shortness of Breath  Associated symptoms include cough and chest pain. Pertinent negatives include no headaches, no rhinorrhea, no vomiting, no abdominal pain, no rash and no leg swelling.    Past Medical History:  Diagnosis Date  . Anxiety   . Asthma   . Bronchitis   . Chronic diastolic CHF (congestive heart failure) ([place])   . Chronic diastolic CHF (congestive heart failure) ([place]) 07/17/2016  . Coronary artery disease    a. NSTEMI 12/2015 - LHC 01/18/16: s/p overlapping DESx2 to RCA, 10% ost-prox Cx,10% mLAD.  Marland Kitchen Depression   . Diabetes mellitus   . Dyslipidemia   . GERD (gastroesophageal reflux disease)   . Heart attack ([place])   . Hypertension   . Hypertensive heart disease   . NSTEMI (non-ST elevated myocardial infarction) ([place]) 12/2015  . Pneumonia     Patient Active Problem List   Diagnosis Date Noted  . Therapeutic drug monitoring 05/17/2017  . Lung nodule 08/01/2016  . Influenza B 07/18/2016  . Chronic diastolic CHF (congestive heart failure) ([place])  07/17/2016  . Acute renal failure superimposed on stage 3 chronic kidney disease ([place]) 07/17/2016  . Right leg swelling 02/27/2016  . Acute respiratory failure with hypoxia ([place])   . Uncontrolled type 2 diabetes mellitus with complication ([place])   . Acute pulmonary embolism ([place])   . HLD (hyperlipidemia)   . Seizures ([place]) 01/27/2016  . Numbness 01/27/2016  . CAD in native artery 01/20/2016  . Hypertensive heart disease 01/20/2016  . NSTEMI (non-ST elevated myocardial infarction) ([place]) 01/18/2016  . Acute bronchitis with asthma with acute exacerbation 01/16/2016  . Acute hypokalemia 01/16/2016  . Hypomagnesemia 01/16/2016  . Acute respiratory failure ([place]) 01/16/2016  . Dyspnea on exertion   . Chest pain   . Asthma exacerbation 07/21/2015  . Upper airway cough syndrome vs Asthma  08/28/2014  . Diabetes mellitus type 2, controlled ([place]) 08/06/2014  . Hyperlipidemia 08/06/2014  . Allergic rhinitis 07/22/2008  . 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 DYSPNEA 07/22/2008  . Diabetes mellitus type 2, uncontrolled ([place]) 07/21/2008  . DIABETIC PERIPHERAL NEUROPATHY 07/21/2008  . Depression with anxiety 07/21/2008  . Essential hypertension 07/21/2008  . Mild persistent chronic asthma without complication 71/24/5809  . G E R D 07/21/2008  . PULMONARY EMBOLISM, HX OF 07/21/2008    Past Surgical History:  Procedure Laterality Date  . ABDOMINAL HYSTERECTOMY    . CARDIAC CATHETERIZATION  1994   minimal LAD dz, no other dz, EF normal  . CARDIAC  CATHETERIZATION N/A 01/18/2016   Procedure: Left Heart Cath and Coronary Angiography;  Surgeon: Burnell Blanks, MD;  Location: [facility];  Service: Cardiovascular;  Laterality: N/A;  . CARDIAC CATHETERIZATION N/A 01/18/2016   Procedure: Coronary Stent Intervention;  Surgeon: Burnell Blanks, MD;  Location: [facility];  Service: Cardiovascular;  Laterality: N/A;  . COLONOSCOPY WITH PROPOFOL N/A 04/09/2014    Procedure: COLONOSCOPY WITH PROPOFOL;  Surgeon: Cleotis Nipper, MD;  Location: [facility];  Service: Endoscopy;  Laterality: N/A;  . CORONARY STENT PLACEMENT  01/19/2016   STENT SYNERGY DES 1.19E17 drug eluting stent was successfully placed, and overlaps the 2.5 x 38 mm Synergy stent placed distally.  . ESOPHAGOGASTRODUODENOSCOPY (EGD) WITH PROPOFOL N/A 04/09/2014   Procedure: ESOPHAGOGASTRODUODENOSCOPY (EGD) WITH PROPOFOL;  Surgeon: Cleotis Nipper, MD;  Location: [facility];  Service: Endoscopy;  Laterality: N/A;  . HEMORRHOID SURGERY    . HERNIA REPAIR    . KNEE ARTHROSCOPY       OB History   None      Home Medications    Prior to Admission medications   Medication Sig Start Date End Date Taking? Authorizing Provider  acetaminophen-codeine (TYLENOL #3) 300-30 MG tablet TAKE ONE TABLET BY MOUTH EVERY 4 HOURS AS NEEDED FOR COUGH Patient not taking: Reported on 05/17/2017 08/18/16   Tanda Rockers, MD  albuterol (PROAIR HFA) 108 (90 Base) MCG/ACT inhaler Inhale 2 puffs into the lungs every 4 (four) hours as needed for wheezing or shortness of breath. 06/29/17   Hayden Rasmussen, MD  albuterol (PROVENTIL) (2.5 MG/3ML) 0.083% nebulizer solution Take 3 mLs (2.5 mg total) by nebulization every 4 (four) hours as needed for shortness of breath. Patient not taking: Reported on 05/17/2017 07/31/15   Theodis Blaze, MD  ALPRAZolam Duanne Moron) 0.5 MG tablet Take 1 tablet (0.5 mg total) by mouth 2 (two) times daily as needed for anxiety. 07/31/15   Theodis Blaze, MD  aspirin EC 81 MG tablet Take 81 mg by mouth daily.     [provider]  atorvastatin (LIPITOR) 40 MG tablet Take 40 mg by mouth daily.    [provider]  Biotin 5000 MCG CAPS Take 1 capsule by mouth daily.    [provider]  budesonide-formoterol (SYMBICORT) 80-4.5 MCG/ACT inhaler Inhale 2 puffs into the lungs 2 (two) times daily. 09/18/16   Tanda Rockers, MD  diltiazem (DILACOR XR) 180 MG 24 hr capsule Take  180 mg by mouth daily. 02/22/16   [provider]  divalproex (DEPAKOTE ER) 500 MG 24 hr tablet Take 2 tablets (1,000 mg total) by mouth at bedtime. 05/29/17   Dennie Bible, NP  esomeprazole (NEXIUM) 40 MG capsule Take 1 capsule (40 mg total) by mouth 2 (two) times daily before a meal. Take twice a day for 4days then resume once daily 07/21/16   Domenic Polite, MD  estradiol (ESTRACE) 1 MG tablet Take 1 mg by mouth daily.     [provider]  fluticasone (FLONASE) 50 MCG/ACT nasal spray Place 2 sprays into both nostrils daily as needed for allergies or rhinitis.    [provider]  furosemide (LASIX) 40 MG tablet Take 1 tablet (40 mg total) by mouth daily. 04/18/16 09/18/16  Eileen Stanford, PA-C  indomethacin (INDOCIN) 50 MG capsule Take 50 mg by mouth 2 (two) times daily with a meal.    [provider]  irbesartan (AVAPRO) 300 MG tablet  02/18/17  [provider]  metFORMIN (GLUCOPHAGE) 500 MG tablet Take 1 tablet (500 mg total) by mouth 2 (two) times daily with a meal. 03/02/16   Geradine Girt, DO  metoprolol succinate (TOPROL-XL) 25 MG 24 hr tablet  02/18/17   [provider]  nitroGLYCERIN (NITROSTAT) 0.4 MG SL tablet Place 0.4 mg under the tongue every 5 (five) minutes as needed for chest pain. x3 as needed    [provider]  potassium chloride SA (K-DUR,KLOR-CON) 20 MEQ tablet Take 20 mEq by mouth daily.     [provider]  predniSONE (DELTASONE) 20 MG tablet Take 3 tablets (60 mg total) by mouth daily. 06/29/17   Hayden Rasmussen, MD  ranitidine (ZANTAC) 150 MG capsule Take 150 mg by mouth every evening.    [provider]  Respiratory Therapy Supplies (FLUTTER) DEVI Use as directed Patient not taking: Reported on 05/17/2017 02/23/16   Tanda Rockers, MD  Tiotropium Bromide Monohydrate (SPIRIVA RESPIMAT) 2.5 MCG/ACT AERS Inhale 2 puffs into the lungs daily. 08/01/16   Parrett, Fonnie Mu, NP  torsemide  (DEMADEX) 20 MG tablet 20 mg. 1-2 daily for swelling 10/25/16   [provider]  traMADol (ULTRAM) 50 MG tablet 50 mg as needed. 09/29/16   [provider]  valsartan (DIOVAN) 320 MG tablet Take 1 tablet (320 mg total) by mouth daily. 01/26/16   Eileen Stanford, PA-C    Family History Family History  Problem Relation Age of Onset  . Allergies Mother   . Asthma Mother   . CAD Father 23  . Diabetes Father   . Hypertension Father   . Congestive Heart Failure Sister        died in her 29s  . CAD Sister 45    Social History Social History   Tobacco Use  . Smoking status: Former Smoker    Packs/day: 1.00    Years: 30.00    Pack years: 30.00    Types: Cigarettes    Last attempt to quit: 01/09/2004    Years since quitting: 13.6  . Smokeless tobacco: Never Used  Substance Use Topics  . Alcohol use: No    Alcohol/week: 0.0 oz  . Drug use: No     Allergies   Amlodipine besylate; Lisinopril; and Pantoprazole sodium   Review of Systems Review of Systems  Constitutional: Negative for activity change and appetite change.  HENT: Negative for congestion and rhinorrhea.   Eyes: Negative for visual disturbance.  Respiratory: Positive for cough, chest tightness and shortness of breath.   Cardiovascular: Positive for chest pain. Negative for leg swelling.  Gastrointestinal: Negative for abdominal pain, nausea and vomiting.  Genitourinary: Negative for dysuria, hematuria, vaginal bleeding and vaginal discharge.  Musculoskeletal: Negative for arthralgias and myalgias.  Skin: Negative for rash.  Neurological: Negative for dizziness, weakness, numbness and headaches.    all other systems are negative except as noted in the HPI and PMH.    Physical Exam Updated Vital Signs BP (!) 208/92 (BP Location: Right Arm) Comment: Simultaneous filing. User may not have seen previous data.  Pulse 97 Comment: Simultaneous filing. User may not have seen previous data.  Temp 98.4  F (36.9 C) (Oral)   Resp 17 Comment: Simultaneous filing. User may not have seen previous data.  Ht 5\' 2"  (1.575 m)   Wt 70.3 kg (155 lb)   LMP  (LMP Unknown)   SpO2 100% Comment: Simultaneous filing. User may not have seen previous data.  BMI 28.35  kg/m   Physical Exam  Constitutional: She is oriented to person, place, and time. She appears well-developed and well-nourished. She appears distressed.  Moderate respiratory distress, speaking in short sentences  HENT:  Head: Normocephalic and atraumatic.  Mouth/Throat: Oropharynx is clear and moist. No oropharyngeal exudate.  Eyes: Pupils are equal, round, and reactive to light. Conjunctivae and EOM are normal.  Neck: Normal range of motion. Neck supple.  No meningismus.  Cardiovascular: Normal rate, regular rhythm, normal heart sounds and intact distal pulses.  No murmur heard. Pulmonary/Chest: She is in respiratory distress. She has wheezes. She has rales.  Minimal air exchange, expiratory wheezing, crackles at bases  Abdominal: Soft. There is no tenderness. There is no rebound and no guarding.  Musculoskeletal: Normal range of motion. She exhibits edema. She exhibits no tenderness.  Trace pedal edema  Neurological: She is alert and oriented to person, place, and time. No cranial nerve deficit. She exhibits normal muscle tone. Coordination normal.  No ataxia on finger to nose bilaterally. No pronator drift. 5/5 strength throughout. CN 2-12 intact.Equal grip strength. Sensation intact.   Skin: Skin is warm.  Psychiatric: She has a normal mood and affect. Her behavior is normal.  Nursing note and vitals reviewed.    ED Treatments / Results  Labs (all labs ordered are listed, but only abnormal results are displayed) Labs Reviewed  CBC WITH DIFFERENTIAL/PLATELET - Abnormal; Notable for the following components:      Result Value   RBC 3.51 (*)    Hemoglobin 11.2 (*)    HCT 34.3 (*)    All other components within normal limits    BASIC METABOLIC PANEL - Abnormal; Notable for the following components:   Potassium 3.2 (*)    Glucose, Bld 118 (*)    Calcium 8.3 (*)    All other components within normal limits  I-STAT ARTERIAL BLOOD GAS, ED - Abnormal; Notable for the following components:   pO2, Arterial 163.0 (*)    All other components within normal limits  TROPONIN I  BRAIN NATRIURETIC PEPTIDE    EKG EKG Interpretation  Date/Time:  Sunday August 12 2017 05:34:53 EDT Ventricular Rate:  92 PR Interval:    QRS Duration: 86 QT Interval:  384 QTC Calculation: 475 R Axis:   7 Text Interpretation:  Sinus rhythm Borderline T wave abnormalities No significant change was found Confirmed by Ezequiel Essex [phone]) on 08/12/2017 5:39:06 AM   Radiology Dg Chest Portable 1 View  Result Date: 08/12/2017 CLINICAL DATA:  Shortness of breath tonight. On bypass. History of CHF, former smoker. EXAM: PORTABLE CHEST 1 VIEW COMPARISON:  Chest radiograph July 30, 2017 FINDINGS: Cardiac silhouette is mildly enlarged and unchanged. Bandlike density in the lung bases. No pleural effusion or focal consolidation. No pneumothorax. Soft tissue planes and included osseous structures are nonsuspicious. IMPRESSION: Mild cardiomegaly and bibasilar atelectasis. Electronically Signed   By: Elon Alas M.D.   On: 08/12/2017 06:06    Procedures Procedures (including critical care time)  Medications Ordered in ED Medications  nitroGLYCERIN 50 mg in dextrose 5 % 250 mL (0.2 mg/mL) infusion (has no administration in time range)  furosemide (LASIX) injection 40 mg (40 mg Intravenous Given 08/12/17 0554)     Initial Impression / Assessment and Plan / ED Course  I have reviewed the triage vital signs and the nursing notes.  Pertinent labs & imaging results that were available during my care of the patient were reviewed by me and considered in my medical decision  making (see chart for details).      Patient with respiratory distress  and hypoxia.  She is hypertensive.  EKG is unchanged.  Patient switched to BiPAP on arrival.  Suspect asthma component as well as CHF component.  She is given IV Lasix and started on nitroglycerin infusion.  Continue nebulizers and steroids.  Patient has diuresed over a liter after IV Lasix.  Chest x-ray shows cardiomegaly with minimal edema.  Work of breathing and wheezing improved though patient remains on BiPAP.  We will give additional nebulizers.  Suspect combination of  CHF as well as asthma contributing to her respiratory failure. ABG does not show significant CO2 retention.  Troponin is negative.  EKG is unchanged.  Admission to stepdown unit discussed with Dr. Maudie Mercury.  CRITICAL CARE Performed by: Ezequiel Essex Total critical care time: 35 minutes Critical care time was exclusive of separately billable procedures and treating other patients. Critical care was necessary to treat or prevent imminent or life-threatening deterioration. Critical care was time spent personally by me on the following activities: development of treatment plan with patient and/or surrogate as well as nursing, discussions with consultants, evaluation of patient's response to treatment, examination of patient, obtaining history from patient or surrogate, ordering and performing treatments and interventions, ordering and review of laboratory studies, ordering and review of radiographic studies, pulse oximetry and re-evaluation of patient's condition.  Final Clinical Impressions(s) / ED Diagnoses   Final diagnoses:  Acute respiratory failure with hypoxia (HCC)  Moderate persistent asthma with exacerbation  Acute on chronic diastolic congestive heart failure [facility])    ED Discharge Orders    None       Ezequiel Essex, MD 08/12/17 [phone]

## 2017-08-12 NOTE — H&P (Signed)
TRH H&P   Patient Demographics:    Lether Tesch, is a 67 y.o. female  MRN: [id number]   DOB - 1950-10-15  Admit Date - 08/12/2017  Outpatient Primary MD for the patient is [name], MD  Referring MD/NP/PA: Charolotte Capuchin  Outpatient Specialists:      Patient coming from: home  Chief Complaint  Patient presents with  . Shortness of Breath      HPI:    Clinton Wahlberg  is a 67 y.o. female, w Asthma , CHF (EF 65-70%) Mild MR, CAD s/p NSTEMI  S/p PTCA/DES x2 mid and distal RCA 01/18/2016 apparently c/o dyspnea starting about 3 days ago. Slight dry cough and wheezing.  Slight left sided chest pain " sharp" without radiation , under the left breast for the past 1 day, "constant".  Nothing appeared to make better or worse.  Pt notes slight lower ext edema but denies orthopnea, pnd.  Pt presented due to dyspnea.  Weight stable.   In ED,   CXR  IMPRESSION: Mild cardiomegaly and bibasilar atelectasis.  Wbc 9.9, Hgb 11.2, Plt 311 Na 138, K 3.2 Bun 16, Creatinine 0.83 Glucose 118  Trop <0.03 BNP 46.4  PH 7.382, PCo2 45.0, PO2 163  EKG nsr at 90, nl axis, poor R progression no st-t changes c/w ischemia  Pt will be admitted for dyspnea ? Asthma exacerbation vs hypertensive urgency,  and chest pain.         Review of systems:    In addition to the HPI above, No Fever-chills, No Headache, No changes with Vision or hearing, No problems swallowing food or Liquids, No Abdominal pain, No Nausea or Vommitting, Bowel movements are regular, No Blood in stool or Urine, No dysuria, No new skin rashes or bruises, No new joints pains-aches,  No new weakness, tingling, numbness in any extremity, No recent weight gain or loss, No polyuria, polydypsia or polyphagia, No significant Mental Stressors.  A full 10 point Review of Systems was done, except as stated above, all  other Review of Systems were negative.   With Past History of the following :    Past Medical History:  Diagnosis Date  . Anxiety   . Asthma   . Bronchitis   . Chronic diastolic CHF (congestive heart failure) ([place])   . Chronic diastolic CHF (congestive heart failure) ([place]) 07/17/2016  . Coronary artery disease    a. NSTEMI 12/2015 - LHC 01/18/16: s/p overlapping DESx2 to RCA, 10% ost-prox Cx,10% mLAD.  Marland Kitchen Depression   . Diabetes mellitus   . Dyslipidemia   . GERD (gastroesophageal reflux disease)   . Heart attack ([place])   . Hypertension   . Hypertensive heart disease   . NSTEMI (non-ST elevated myocardial infarction) ([place]) 12/2015  . Pneumonia       Past Surgical History:  Procedure Laterality Date  . ABDOMINAL HYSTERECTOMY    .  CARDIAC CATHETERIZATION  1994   minimal LAD dz, no other dz, EF normal  . CARDIAC CATHETERIZATION N/A 01/18/2016   Procedure: Left Heart Cath and Coronary Angiography;  Surgeon: Burnell Blanks, MD;  Location: [facility];  Service: Cardiovascular;  Laterality: N/A;  . CARDIAC CATHETERIZATION N/A 01/18/2016   Procedure: Coronary Stent Intervention;  Surgeon: Burnell Blanks, MD;  Location: [facility];  Service: Cardiovascular;  Laterality: N/A;  . COLONOSCOPY WITH PROPOFOL N/A 04/09/2014   Procedure: COLONOSCOPY WITH PROPOFOL;  Surgeon: Cleotis Nipper, MD;  Location: [facility];  Service: Endoscopy;  Laterality: N/A;  . CORONARY STENT PLACEMENT  01/19/2016   STENT SYNERGY DES [id number] drug eluting stent was successfully placed, and overlaps the 2.5 x 38 mm Synergy stent placed distally.  . ESOPHAGOGASTRODUODENOSCOPY (EGD) WITH PROPOFOL N/A 04/09/2014   Procedure: ESOPHAGOGASTRODUODENOSCOPY (EGD) WITH PROPOFOL;  Surgeon: Cleotis Nipper, MD;  Location: [facility];  Service: Endoscopy;  Laterality: N/A;  . HEMORRHOID SURGERY    . HERNIA REPAIR    . KNEE ARTHROSCOPY        Social History:     Social History    Tobacco Use  . Smoking status: Former Smoker    Packs/day: 1.00    Years: 30.00    Pack years: 30.00    Types: Cigarettes    Last attempt to quit: 01/09/2004    Years since quitting: 13.6  . Smokeless tobacco: Never Used  Substance Use Topics  . Alcohol use: No    Alcohol/week: 0.0 oz     Lives - at home  Mobility - walks by self   Family History :     Family History  Problem Relation Age of Onset  . Allergies Mother   . Asthma Mother   . CAD Father 68  . Diabetes Father   . Hypertension Father   . Congestive Heart Failure Sister        died in her 70s  . CAD Sister 84      Home Medications:   Prior to Admission medications   Medication Sig Start Date End Date Taking? Authorizing Provider  acetaminophen-codeine (TYLENOL #3) 300-30 MG tablet TAKE ONE TABLET BY MOUTH EVERY 4 HOURS AS NEEDED FOR COUGH Patient not taking: Reported on 05/17/2017 08/18/16   Tanda Rockers, MD  albuterol (PROAIR HFA) 108 (90 Base) MCG/ACT inhaler Inhale 2 puffs into the lungs every 4 (four) hours as needed for wheezing or shortness of breath. 06/29/17   Hayden Rasmussen, MD  albuterol (PROVENTIL) (2.5 MG/3ML) 0.083% nebulizer solution Take 3 mLs (2.5 mg total) by nebulization every 4 (four) hours as needed for shortness of breath. Patient not taking: Reported on 05/17/2017 07/31/15   Theodis Blaze, MD  ALPRAZolam Duanne Moron) 0.5 MG tablet Take 1 tablet (0.5 mg total) by mouth 2 (two) times daily as needed for anxiety. 07/31/15   Theodis Blaze, MD  aspirin EC 81 MG tablet Take 81 mg by mouth daily.     [provider]  atorvastatin (LIPITOR) 40 MG tablet Take 40 mg by mouth daily.    [provider]  Biotin 5000 MCG CAPS Take 1 capsule by mouth daily.    [provider]  budesonide-formoterol (SYMBICORT) 80-4.5 MCG/ACT inhaler Inhale 2 puffs into the lungs 2 (two) times daily. 09/18/16   Tanda Rockers, MD  diltiazem (DILACOR XR) 180 MG 24 hr capsule Take 180 mg by mouth  daily. 02/22/16   [provider]  divalproex (DEPAKOTE ER) 500 MG 24 hr tablet Take 2 tablets (1,000 mg total) by mouth at bedtime. 05/29/17   Dennie Bible, NP  esomeprazole (NEXIUM) 40 MG capsule Take 1 capsule (40 mg total) by mouth 2 (two) times daily before a meal. Take twice a day for 4days then resume once daily 07/21/16   Domenic Polite, MD  estradiol (ESTRACE) 1 MG tablet Take 1 mg by mouth daily.     [provider]  fluticasone (FLONASE) 50 MCG/ACT nasal spray Place 2 sprays into both nostrils daily as needed for allergies or rhinitis.    [provider]  furosemide (LASIX) 40 MG tablet Take 1 tablet (40 mg total) by mouth daily. 04/18/16 09/18/16  Eileen Stanford, PA-C  indomethacin (INDOCIN) 50 MG capsule Take 50 mg by mouth 2 (two) times daily with a meal.    [provider]  irbesartan (AVAPRO) 300 MG tablet  02/18/17   [provider]  metFORMIN (GLUCOPHAGE) 500 MG tablet Take 1 tablet (500 mg total) by mouth 2 (two) times daily with a meal. 03/02/16   Geradine Girt, DO  metoprolol succinate (TOPROL-XL) 25 MG 24 hr tablet  02/18/17   [provider]  nitroGLYCERIN (NITROSTAT) 0.4 MG SL tablet Place 0.4 mg under the tongue every 5 (five) minutes as needed for chest pain. x3 as needed    [provider]  potassium chloride SA (K-DUR,KLOR-CON) 20 MEQ tablet Take 20 mEq by mouth daily.     [provider]  predniSONE (DELTASONE) 20 MG tablet Take 3 tablets (60 mg total) by mouth daily. 06/29/17   Hayden Rasmussen, MD  ranitidine (ZANTAC) 150 MG capsule Take 150 mg by mouth every evening.    [provider]  Respiratory Therapy Supplies (FLUTTER) DEVI Use as directed Patient not taking: Reported on 05/17/2017 02/23/16   Tanda Rockers, MD  Tiotropium Bromide Monohydrate (SPIRIVA RESPIMAT) 2.5 MCG/ACT AERS Inhale 2 puffs into the lungs daily. 08/01/16   Parrett, Fonnie Mu, NP  torsemide (DEMADEX) 20 MG  tablet 20 mg. 1-2 daily for swelling 10/25/16   [provider]  traMADol (ULTRAM) 50 MG tablet 50 mg as needed. 09/29/16   [provider]  valsartan (DIOVAN) 320 MG tablet Take 1 tablet (320 mg total) by mouth daily. 01/26/16   Eileen Stanford, PA-C     Allergies:     Allergies  Allergen Reactions  . Amlodipine Besylate Other (See Comments)    Reaction unknown  . Lisinopril Other (See Comments)    Reaction unknown  . Pantoprazole Sodium Other (See Comments)    Reaction unknown     Physical Exam:   Vitals  Blood pressure (!) 167/94, pulse 78, temperature 98.4 F (36.9 C), temperature source Oral, resp. rate 16, height 5\' 2"  (1.575 m), weight 70.3 kg (155 lb), SpO2 100 %.   1. General  lying in bed in NAD,    2. Normal affect and insight, Not Suicidal or Homicidal, Awake Alert, Oriented X 3.  3. No F.N deficits, ALL C.Nerves Intact, Strength 5/5 all 4 extremities, Sensation intact all 4 extremities, Plantars down going.  4. Ears and Eyes appear Normal, Conjunctivae clear, PERRLA. Moist Oral Mucosa.  5. Supple Neck, No JVD, No cervical lymphadenopathy appriciated, No Carotid Bruits.  6. Symmetrical Chest wall movement, slightly tight,  + bilateral exp wheezing, no crackles.   7. RRR, No Gallops, Rubs or Murmurs, No Parasternal Heave.  8. Positive Bowel  Sounds, Abdomen Soft, No tenderness, No organomegaly appriciated,No rebound -guarding or rigidity.  9.  No Cyanosis, Normal Skin Turgor, No Skin Rash or Bruise.  10. Good muscle tone,  joints appear normal , no effusions, Normal ROM.  11. No Palpable Lymph Nodes in Neck or Axillae  On bipap   Data Review:    CBC Recent Labs  Lab 08/12/17 0544  WBC 9.9  HGB 11.2*  HCT 34.3*  PLT 311  MCV 97.7  MCH 31.9  MCHC 32.7  RDW 14.4  LYMPHSABS 3.8  MONOABS 0.7  EOSABS 0.2  BASOSABS 0.1    ------------------------------------------------------------------------------------------------------------------  Chemistries  Recent Labs  Lab 08/12/17 0544  NA 138  K 3.2*  CL 105  CO2 22  GLUCOSE 118*  BUN 16  CREATININE 0.83  CALCIUM 8.3*   ------------------------------------------------------------------------------------------------------------------ estimated creatinine clearance is 59.6 mL/min (by C-G formula based on SCr of 0.83 mg/dL). ------------------------------------------------------------------------------------------------------------------ No results for input(s): TSH, T4TOTAL, T3FREE, THYROIDAB in the last 72 hours.  Invalid input(s): FREET3  Coagulation profile No results for input(s): INR, PROTIME in the last 168 hours. ------------------------------------------------------------------------------------------------------------------- No results for input(s): DDIMER in the last 72 hours. -------------------------------------------------------------------------------------------------------------------  Cardiac Enzymes Recent Labs  Lab 08/12/17 0544  TROPONINI <0.03   ------------------------------------------------------------------------------------------------------------------    Component Value Date/Time   BNP 46.4 08/12/2017 0544   BNP <4.0 01/26/2016 1608     ---------------------------------------------------------------------------------------------------------------  Urinalysis    Component Value Date/Time   COLORURINE YELLOW 08/07/2014 1304   APPEARANCEUR CLEAR 08/07/2014 1304   LABSPEC 1.012 08/07/2014 1304   PHURINE 5.0 08/07/2014 1304   GLUCOSEU NEGATIVE 08/07/2014 1304   HGBUR NEGATIVE 08/07/2014 1304   BILIRUBINUR NEGATIVE 08/07/2014 1304   KETONESUR NEGATIVE 08/07/2014 1304   PROTEINUR NEGATIVE 08/07/2014 1304   UROBILINOGEN 0.2 08/07/2014 1304   NITRITE NEGATIVE 08/07/2014 1304   LEUKOCYTESUR NEGATIVE 08/07/2014  1304    ----------------------------------------------------------------------------------------------------------------   Imaging Results:    Dg Chest Portable 1 View  Result Date: 08/12/2017 CLINICAL DATA:  Shortness of breath tonight. On bypass. History of CHF, former smoker. EXAM: PORTABLE CHEST 1 VIEW COMPARISON:  Chest radiograph July 30, 2017 FINDINGS: Cardiac silhouette is mildly enlarged and unchanged. Bandlike density in the lung bases. No pleural effusion or focal consolidation. No pneumothorax. Soft tissue planes and included osseous structures are nonsuspicious. IMPRESSION: Mild cardiomegaly and bibasilar atelectasis. Electronically Signed   By: Elon Alas M.D.   On: 08/12/2017 06:06       Assessment & Plan:    Principal Problem:   Dyspnea Active Problems:   Diabetes mellitus type 2, uncontrolled (HCC)   Essential hypertension   Hypokalemia   Anemia    Dyspnea Likely asthma exacerbation Solumedrol 80mg  iv q8h spiriva 1puff qday Symbicort 2puff bid=> Dulera  Albuterol 1 neb q6h and q6h prn  Chest pain Tele Trop I q6h x3 Check cardiac echo Check hga1c, lipid Cardiology consult by email Asprin 325mg  poq day Start Lipitor 80mg  po qhs  H/o CHF (65-70%) Cont Torsemide 20mg  po qday Cont Irbesartan 300mg  po qday Cont metoprolol XL 25mg  po qday  Hypokalemia Replete Check cmp in am  Hypertension uncontrolled Recommend dc nitro gtt in am Hydralazine 10mg  iv q6h prn  Cont Irbesartan Cont metoprolol  Cont cardizem XR 180mg  po qday  Anemia Check cbc in am  DM2 Cont Metformin 500mg  po bid fsbs ac and qhs, ISS  Gerd Cont PPI     DVT Prophylaxis  Lovenox - SCDs  AM Labs Ordered, also please review Full  Orders  Family Communication: Admission, patients condition and plan of care including tests being ordered have been discussed with the patient  who indicate understanding and agree with the plan and Code Status.  Code Status FULL  CODE  Likely DC to  home  Condition GUARDED   Consults called:  Cardiology by email  Admission status: inpatient  Time spent in minutes : 45    Jani Gravel M.D on 08/12/2017 at 8:02 AM  Between 7am to 7pm - Pager - [phone]  . After 7pm go to [email or web address] - password [facility]  [facility] - Office  [phone]

## 2017-08-12 NOTE — ED Triage Notes (Signed)
Pt bib [facility] from home d/t respiratory distress.  Pt reported going to bed not feeling well and her breathing got progressively worse.  Pt given 2 duo nebs and 2 albuterol tx en route.  Pt also given 125 mg solumedrol and 2 g of Mag en route.

## 2017-08-12 NOTE — Progress Notes (Signed)
Pt arrived to the unit pt wheezing, respiratory notified

## 2017-08-13 ENCOUNTER — Ambulatory Visit (HOSPITAL_COMMUNITY): Payer: Medicare Other

## 2017-08-13 ENCOUNTER — Ambulatory Visit: Payer: Medicare Other | Admitting: Cardiology

## 2017-08-13 ENCOUNTER — Encounter (HOSPITAL_COMMUNITY): Payer: Self-pay | Admitting: General Practice

## 2017-08-13 DIAGNOSIS — J4541 Moderate persistent asthma with (acute) exacerbation: Principal | ICD-10-CM

## 2017-08-13 DIAGNOSIS — I10 Essential (primary) hypertension: Secondary | ICD-10-CM

## 2017-08-13 DIAGNOSIS — I361 Nonrheumatic tricuspid (valve) insufficiency: Secondary | ICD-10-CM

## 2017-08-13 DIAGNOSIS — R072 Precordial pain: Secondary | ICD-10-CM

## 2017-08-13 LAB — CBC
HEMATOCRIT: 37.5 % (ref 36.0–46.0)
Hemoglobin: 12.2 g/dL (ref 12.0–15.0)
MCH: 31.6 pg (ref 26.0–34.0)
MCHC: 32.5 g/dL (ref 30.0–36.0)
MCV: 97.2 fL (ref 78.0–100.0)
PLATELETS: 385 10*3/uL (ref 150–400)
RBC: 3.86 MIL/uL — AB (ref 3.87–5.11)
RDW: 14.5 % (ref 11.5–15.5)
WBC: 11.9 10*3/uL — ABNORMAL HIGH (ref 4.0–10.5)

## 2017-08-13 LAB — COMPREHENSIVE METABOLIC PANEL
ALT: 15 U/L (ref 14–54)
AST: 21 U/L (ref 15–41)
Albumin: 3.3 g/dL — ABNORMAL LOW (ref 3.5–5.0)
Alkaline Phosphatase: 54 U/L (ref 38–126)
Anion gap: 15 (ref 5–15)
BUN: 29 mg/dL — ABNORMAL HIGH (ref 6–20)
CHLORIDE: 100 mmol/L — AB (ref 101–111)
CO2: 23 mmol/L (ref 22–32)
Calcium: 8.3 mg/dL — ABNORMAL LOW (ref 8.9–10.3)
Creatinine, Ser: 1.25 mg/dL — ABNORMAL HIGH (ref 0.44–1.00)
GFR, EST AFRICAN AMERICAN: 50 mL/min — AB (ref >60)
GFR, EST NON AFRICAN AMERICAN: 43 mL/min — AB (ref >60)
Glucose, Bld: 184 mg/dL — ABNORMAL HIGH (ref 65–99)
POTASSIUM: 3.8 mmol/L (ref 3.5–5.1)
Sodium: 138 mmol/L (ref 135–145)
TOTAL PROTEIN: 7.3 g/dL (ref 6.5–8.1)
Total Bilirubin: 0.4 mg/dL (ref 0.3–1.2)

## 2017-08-13 LAB — GLUCOSE, CAPILLARY
GLUCOSE-CAPILLARY: 234 mg/dL — AB (ref 65–99)
GLUCOSE-CAPILLARY: 170 mg/dL — AB (ref 65–99)
Glucose-Capillary: 188 mg/dL — ABNORMAL HIGH (ref 65–99)
Glucose-Capillary: 186 mg/dL — ABNORMAL HIGH (ref 65–99)

## 2017-08-13 LAB — ECHOCARDIOGRAM COMPLETE
HEIGHTINCHES: 62 in
WEIGHTICAEL: 2475.2 [oz_av]

## 2017-08-13 LAB — TROPONIN I

## 2017-08-13 MED ORDER — ALUM & MAG HYDROXIDE-SIMETH 200-200-20 MG/5ML PO SUSP
30.0000 mL | Freq: Four times a day (QID) | ORAL | Status: DC | PRN
  Administered 2017-08-13: 30 mL via ORAL
  Filled 2017-08-13: qty 30

## 2017-08-13 MED ORDER — PANTOPRAZOLE SODIUM 40 MG PO TBEC
40.0000 mg | DELAYED_RELEASE_TABLET | Freq: Every day | ORAL | Status: DC
  Administered 2017-08-13 – 2017-08-17 (×5): 40 mg via ORAL
  Filled 2017-08-13 (×5): qty 1

## 2017-08-13 MED ORDER — LORATADINE 10 MG PO TABS
10.0000 mg | ORAL_TABLET | Freq: Every day | ORAL | Status: DC
  Administered 2017-08-13 – 2017-08-17 (×5): 10 mg via ORAL
  Filled 2017-08-13 (×5): qty 1

## 2017-08-13 MED ORDER — METHYLPREDNISOLONE SODIUM SUCC 125 MG IJ SOLR
60.0000 mg | Freq: Three times a day (TID) | INTRAMUSCULAR | Status: DC
Start: 2017-08-13 — End: 2017-08-15
  Administered 2017-08-13 – 2017-08-14 (×5): 60 mg via INTRAVENOUS
  Filled 2017-08-13 (×5): qty 2

## 2017-08-13 MED ORDER — GUAIFENESIN-DM 100-10 MG/5ML PO SYRP
5.0000 mL | ORAL_SOLUTION | ORAL | Status: DC | PRN
  Administered 2017-08-13 – 2017-08-15 (×3): 5 mL via ORAL
  Filled 2017-08-13 (×3): qty 5

## 2017-08-13 MED ORDER — NEBIVOLOL HCL 2.5 MG PO TABS
2.5000 mg | ORAL_TABLET | Freq: Every day | ORAL | Status: DC
  Administered 2017-08-14 – 2017-08-17 (×4): 2.5 mg via ORAL
  Filled 2017-08-13 (×4): qty 1

## 2017-08-13 NOTE — Progress Notes (Signed)
Coupon card given to patient for Bystolic.   RE: Benefit check  Received: Today  Message Contents  Memory Argue CMA        # 5.  S/W ROZANNA @ [employer] [facility] # [id number]   BYSTOLIC  2.5 MG DAILY  COVER-YES  CO-PAY- $ 25.00  TIER- 2 DRUG  PRIOR APPROVAL- NO   PREFERRED PHARMACY : [facility] , [facility]

## 2017-08-13 NOTE — Progress Notes (Addendum)
PROGRESS NOTE    [name]  PNT:[id number] DOB: 08/26/1950 DOA: 08/12/2017 PCP: [name], MD   Outpatient Specialists:     Brief Narrative:  [name]  is a 67 y.o. female, w Asthma , CHF (EF 65-70%) Mild MR, CAD s/p NSTEMI  S/p PTCA/DES x2 mid and distal RCA 01/18/2016 apparently c/o dyspnea starting about 3 days ago. Slight dry cough and wheezing.  Slight left sided chest pain " sharp" without radiation , under the left breast for the past 1 day, "constant".  Nothing appeared to make better or worse.  Pt notes slight lower ext edema but denies orthopnea, pnd.  Pt presented due to dyspnea.  Weight stable.       Assessment & Plan:   Principal Problem:   Dyspnea Active Problems:   Diabetes mellitus type 2, uncontrolled (HCC)   Essential hypertension   Asthma exacerbation   Hypokalemia   Anemia   Dyspnea due to asthma exacerbation -wean solumedrol (not moving much air-- most likely need slow wean and I anticipate d/c on Wednesday) -patient not good with taking scheduled inhalers at home spiriva 1puff qday Symbicort 2puff bid=> Dulera  Albuterol nebs PRN  Chest pain -cardiology consult appreciated -BB changed  Chronic diastolic CHF (08-67%) Torsemide 20mg  po qday-- patient takes only PRN for swelling Cont Irbesartan 300mg  po qday metoprolol XL 25mg  changed to bistolic  Hypokalemia Repleted  Hypertension uncontrolled -resume home meds with adjustments named above   DM2 Cont Metformin 500mg  po bid SSI  Gerd Cont PPI     DVT prophylaxis:  Lovenox   Code Status: Full Code   Family Communication:   Disposition Plan:     Consultants:  cards  Subjective: Breathing better but not back to baseline  Objective: Vitals:   08/13/17 0528 08/13/17 0848 08/13/17 1014 08/13/17 1132  BP: (!) 162/82  (!) 166/92 (!) 168/87  Pulse: 88  87 77  Resp: 20   18  Temp: 98.1 F (36.7 C)   97.6 F (36.4 C)  TempSrc: Oral   Oral  SpO2:  100% 98% 100% 100%  Weight: 70.2 kg (154 lb 11.2 oz)     Height:        Intake/Output Summary (Last 24 hours) at 08/13/2017 1311 Last data filed at 08/13/2017 1019 Gross per 24 hour  Intake 603 ml  Output 2250 ml  Net -1647 ml   Filed Weights   08/12/17 0537 08/12/17 1809 08/13/17 0528  Weight: 70.3 kg (155 lb) 70.9 kg (156 lb 3.2 oz) 70.2 kg (154 lb 11.2 oz)    Examination:  General exam: Appears calm and comfortable  Respiratory system: not moving much air, no wheezing Cardiovascular system: S1 & S2 heard, RRR. No JVD, murmurs, rubs, gallops or clicks. No pedal edema. Gastrointestinal system: Abdomen is nondistended, soft and nontender. No organomegaly or masses felt. Normal bowel sounds heard. Central nervous system: Alert and oriented. No focal neurological deficits. Extremities: Symmetric 5 x 5 power. Skin: No rashes, lesions or ulcers Psychiatry: Judgement and insight appear normal. Mood & affect appropriate.     Data Reviewed: I have personally reviewed following labs and imaging studies  CBC: Recent Labs  Lab 08/12/17 0544 08/13/17 0350  WBC 9.9 11.9*  NEUTROABS 5.2  --   HGB 11.2* 12.2  HCT 34.3* 37.5  MCV 97.7 97.2  PLT 311 619   Basic Metabolic Panel: Recent Labs  Lab 08/12/17 0544 08/13/17 0350  NA 138 138  K 3.2* 3.8  CL  105 100*  CO2 22 23  GLUCOSE 118* 184*  BUN 16 29*  CREATININE 0.83 1.25*  CALCIUM 8.3* 8.3*   GFR: Estimated Creatinine Clearance: 39.5 mL/min (A) (by C-G formula based on SCr of 1.25 mg/dL (H)). Liver Function Tests: Recent Labs  Lab 08/13/17 0350  AST 21  ALT 15  ALKPHOS 54  BILITOT 0.4  PROT 7.3  ALBUMIN 3.3*   No results for input(s): LIPASE, AMYLASE in the last 168 hours. No results for input(s): AMMONIA in the last 168 hours. Coagulation Profile: No results for input(s): INR, PROTIME in the last 168 hours. Cardiac Enzymes: Recent Labs  Lab 08/12/17 0544 08/12/17 1647 08/12/17 2045 08/13/17 0350    TROPONINI <0.03 <0.03 <0.03 <0.03   BNP (last 3 results) No results for input(s): PROBNP in the last 8760 hours. HbA1C: No results for input(s): HGBA1C in the last 72 hours. CBG: Recent Labs  Lab 08/12/17 1829 08/13/17 0800 08/13/17 1130  GLUCAP 206* 188* 234*   Lipid Profile: No results for input(s): CHOL, HDL, LDLCALC, TRIG, CHOLHDL, LDLDIRECT in the last 72 hours. Thyroid Function Tests: No results for input(s): TSH, T4TOTAL, FREET4, T3FREE, THYROIDAB in the last 72 hours. Anemia Panel: No results for input(s): VITAMINB12, FOLATE, FERRITIN, TIBC, IRON, RETICCTPCT in the last 72 hours. Urine analysis:    Component Value Date/Time   COLORURINE YELLOW 08/07/2014 1304   APPEARANCEUR CLEAR 08/07/2014 1304   LABSPEC 1.012 08/07/2014 1304   PHURINE 5.0 08/07/2014 1304   GLUCOSEU NEGATIVE 08/07/2014 1304   HGBUR NEGATIVE 08/07/2014 1304   BILIRUBINUR NEGATIVE 08/07/2014 1304   KETONESUR NEGATIVE 08/07/2014 1304   PROTEINUR NEGATIVE 08/07/2014 1304   UROBILINOGEN 0.2 08/07/2014 1304   NITRITE NEGATIVE 08/07/2014 1304   LEUKOCYTESUR NEGATIVE 08/07/2014 1304     )No results found for this or any previous visit (from the past 240 hour(s)).    Anti-infectives (From admission, onward)   None       Radiology Studies: Dg Chest Portable 1 View  Result Date: 08/12/2017 CLINICAL DATA:  Shortness of breath tonight. On bypass. History of CHF, former smoker. EXAM: PORTABLE CHEST 1 VIEW COMPARISON:  Chest radiograph July 30, 2017 FINDINGS: Cardiac silhouette is mildly enlarged and unchanged. Bandlike density in the lung bases. No pleural effusion or focal consolidation. No pneumothorax. Soft tissue planes and included osseous structures are nonsuspicious. IMPRESSION: Mild cardiomegaly and bibasilar atelectasis. Electronically Signed   By: Elon Alas M.D.   On: 08/12/2017 06:06        Scheduled Meds: . albuterol  2.5 mg Nebulization Q6H  . aspirin EC  81 mg Oral Daily   . atorvastatin  40 mg Oral Daily  . diltiazem  180 mg Oral Daily  . divalproex  1,000 mg Oral QHS  . enoxaparin (LOVENOX) injection  40 mg Subcutaneous Q24H  . estradiol  1 mg Oral Daily  . famotidine  10 mg Oral QHS  . irbesartan  300 mg Oral Daily  . metFORMIN  500 mg Oral BID WC  . methylPREDNISolone (SOLU-MEDROL) injection  60 mg Intravenous Q8H  . mometasone-formoterol  2 puff Inhalation BID  . [START ON 08/14/2017] nebivolol  2.5 mg Oral Daily  . pantoprazole  40 mg Oral Daily  . potassium chloride SA  20 mEq Oral Daily  . sodium chloride flush  3 mL Intravenous Q12H  . tiotropium  1 capsule Inhalation Daily  . torsemide  20 mg Oral Daily   Continuous Infusions: . sodium chloride  LOS: 1 day    Time spent: 35 min    Geradine Girt, DO [facility] Pager [phone]  If 7PM-7AM, please contact night-coverage [email or web address] Password [email or web address] 08/13/2017, 1:11 PM

## 2017-08-13 NOTE — Progress Notes (Signed)
*  PRELIMINARY RESULTS* Echocardiogram 2D Echocardiogram has been performed.  [name] 08/13/2017, 4:18 PM

## 2017-08-13 NOTE — Progress Notes (Addendum)
Progress Note  Patient Name: [name] Date of Encounter: 08/13/2017  Primary Cardiologist: Candee Furbish, MD   Subjective   Dyspnea with exertion.   Inpatient Medications    Scheduled Meds: . albuterol  2.5 mg Nebulization Q6H  . aspirin EC  81 mg Oral Daily  . atorvastatin  40 mg Oral Daily  . diltiazem  180 mg Oral Daily  . divalproex  1,000 mg Oral QHS  . enoxaparin (LOVENOX) injection  40 mg Subcutaneous Q24H  . estradiol  1 mg Oral Daily  . famotidine  10 mg Oral QHS  . irbesartan  300 mg Oral Daily  . metFORMIN  500 mg Oral BID WC  . methylPREDNISolone (SOLU-MEDROL) injection  80 mg Intravenous Q8H  . metoprolol succinate  25 mg Oral Daily  . mometasone-formoterol  2 puff Inhalation BID  . pantoprazole  40 mg Oral Daily  . potassium chloride SA  20 mEq Oral Daily  . sodium chloride flush  3 mL Intravenous Q12H  . tiotropium  1 capsule Inhalation Daily  . torsemide  20 mg Oral Daily   Continuous Infusions: . sodium chloride     PRN Meds: sodium chloride, acetaminophen **OR** acetaminophen, albuterol, ALPRAZolam, fluticasone, guaiFENesin-dextromethorphan, methocarbamol, sodium chloride flush, traMADol   Vital Signs    Vitals:   08/13/17 0019 08/13/17 0528 08/13/17 0848 08/13/17 1014  BP: (!) 159/76 (!) 162/82  (!) 166/92  Pulse: 97 88  87  Resp: 20 20    Temp: 98 F (36.7 C) 98.1 F (36.7 C)    TempSrc: Oral Oral    SpO2: 100% 100% 98% 100%  Weight:  154 lb 11.2 oz (70.2 kg)    Height:        Intake/Output Summary (Last 24 hours) at 08/13/2017 1032 Last data filed at 08/13/2017 1019 Gross per 24 hour  Intake 603 ml  Output 2250 ml  Net -1647 ml   Filed Weights   08/12/17 0537 08/12/17 1809 08/13/17 0528  Weight: 155 lb (70.3 kg) 156 lb 3.2 oz (70.9 kg) 154 lb 11.2 oz (70.2 kg)    Telemetry    SR  ECG    N/A  Physical Exam   GEN: No acute distress.   Neck: No JVD Cardiac: RRR, no murmurs, rubs, or gallops.  Respiratory: Clear to  auscultation bilaterally. GI: Soft, nontender, non-distended  MS: Trace edema; No deformity. Neuro:  Nonfocal  Psych: Normal affect   Labs    Chemistry Recent Labs  Lab 08/12/17 0544 08/13/17 0350  NA 138 138  K 3.2* 3.8  CL 105 100*  CO2 22 23  GLUCOSE 118* 184*  BUN 16 29*  CREATININE 0.83 1.25*  CALCIUM 8.3* 8.3*  PROT  --  7.3  ALBUMIN  --  3.3*  AST  --  21  ALT  --  15  ALKPHOS  --  54  BILITOT  --  0.4  GFRNONAA >60 43*  GFRAA >60 50*  ANIONGAP 11 15     Hematology Recent Labs  Lab 08/12/17 0544 08/13/17 0350  WBC 9.9 11.9*  RBC 3.51* 3.86*  HGB 11.2* 12.2  HCT 34.3* 37.5  MCV 97.7 97.2  MCH 31.9 31.6  MCHC 32.7 32.5  RDW 14.4 14.5  PLT 311 385    Cardiac Enzymes Recent Labs  Lab 08/12/17 0544 08/12/17 1647 08/12/17 2045 08/13/17 0350  TROPONINI <0.03 <0.03 <0.03 <0.03   No results for input(s): TROPIPOC in the last 168 hours.  BNP Recent Labs  Lab 08/12/17 0544  BNP 46.4     DDimer No results for input(s): DDIMER in the last 168 hours.   Radiology    Dg Chest Portable 1 View  Result Date: 08/12/2017 CLINICAL DATA:  Shortness of breath tonight. On bypass. History of CHF, former smoker. EXAM: PORTABLE CHEST 1 VIEW COMPARISON:  Chest radiograph July 30, 2017 FINDINGS: Cardiac silhouette is mildly enlarged and unchanged. Bandlike density in the lung bases. No pleural effusion or focal consolidation. No pneumothorax. Soft tissue planes and included osseous structures are nonsuspicious. IMPRESSION: Mild cardiomegaly and bibasilar atelectasis. Electronically Signed   By: Elon Alas M.D.   On: 08/12/2017 06:06    Cardiac Studies   Pending echo this admission   Patient Profile     [name] is a 67 y.o. female with a hx of  recurrent asthma, CAD s/p DES x2 to mid to distal RCA in September 7408, chronic diastolic heart failure, hypertension, hyperlipidemia and DM presented with chest pain in setting of acute asthma  exacerbation. Given solumedrol.   Assessment & Plan     1. Chest pain: She has been having chest pain for the past 3-4 days, it is not associated with exertion.  There is no EKG changes.  Troponin remained negative. Likely due to acute asthma exacerbation. Pending echo.   2. Asthma exacerbation: breathing improve.  May consider switching Toprol-XL to bisoprolol.  3. CAD s/p DES x2 to mid to distal RCA in September 2017: Continue aspirin and statin  4. Chronic diastolic heart failure: She endorse excess salt intake.  BMP normal. Given IV lasix 40mg  x 1 yesterday. Now on home Torsemide. Net I & O negative 3.4 L. Weight down 2 lb.   5. Hypertension: Blood pressure is uncontrolled. Resumed home medications. Improving. Consider up titration of medications.   6. Hyperlipidemia: On 40 mg daily of Lipitor  7. AKI: Follow closely.   For questions or updates, please contact [place] Please consult [email or web address] for contact info under Cardiology/STEMI.   SignedCrista Luria [place], PA  08/13/2017, 10:32 AM     The patient was seen, examined and discussed with Bhagat,Bhavinkumar PA-C and I agree with the above.   Chest pain with coughing only, she is still wheezing, Troponin negative x 3 and unchanged ECG, atypical chest pain. I would switch metoprolol to Bystolic 2.5 mg po daily. Please get a discount card from a case manager, otherwise we will provide it at the clinic.  We will sign off.   Ena Dawley, MD 08/13/2017

## 2017-08-14 LAB — GLUCOSE, CAPILLARY
GLUCOSE-CAPILLARY: 199 mg/dL — AB (ref 65–99)
Glucose-Capillary: 192 mg/dL — ABNORMAL HIGH (ref 65–99)
Glucose-Capillary: 314 mg/dL — ABNORMAL HIGH (ref 65–99)
Glucose-Capillary: 170 mg/dL — ABNORMAL HIGH (ref 65–99)

## 2017-08-14 LAB — CBC
HCT: 35.3 % — ABNORMAL LOW (ref 36.0–46.0)
HEMOGLOBIN: 11.5 g/dL — AB (ref 12.0–15.0)
MCH: 31.3 pg (ref 26.0–34.0)
MCHC: 32.6 g/dL (ref 30.0–36.0)
MCV: 96.2 fL (ref 78.0–100.0)
PLATELETS: 383 10*3/uL (ref 150–400)
RBC: 3.67 MIL/uL — ABNORMAL LOW (ref 3.87–5.11)
RDW: 14.2 % (ref 11.5–15.5)
WBC: 17.4 10*3/uL — ABNORMAL HIGH (ref 4.0–10.5)

## 2017-08-14 LAB — BASIC METABOLIC PANEL
Anion gap: 11 (ref 5–15)
BUN: 30 mg/dL — AB (ref 6–20)
CALCIUM: 8.1 mg/dL — AB (ref 8.9–10.3)
CO2: 25 mmol/L (ref 22–32)
CREATININE: 1.01 mg/dL — AB (ref 0.44–1.00)
Chloride: 99 mmol/L — ABNORMAL LOW (ref 101–111)
GFR calc non Af Amer: 56 mL/min — ABNORMAL LOW (ref >60)
Glucose, Bld: 199 mg/dL — ABNORMAL HIGH (ref 65–99)
Potassium: 3.9 mmol/L (ref 3.5–5.1)
SODIUM: 135 mmol/L (ref 135–145)

## 2017-08-14 MED ORDER — DILTIAZEM HCL ER COATED BEADS 240 MG PO CP24
240.0000 mg | ORAL_CAPSULE | Freq: Every day | ORAL | Status: DC
  Administered 2017-08-15 – 2017-08-17 (×3): 240 mg via ORAL
  Filled 2017-08-14 (×3): qty 1

## 2017-08-14 MED ORDER — INSULIN ASPART 100 UNIT/ML ~~LOC~~ SOLN
0.0000 [IU] | Freq: Three times a day (TID) | SUBCUTANEOUS | Status: DC
  Administered 2017-08-14: 4 [IU] via SUBCUTANEOUS
  Administered 2017-08-14: 15 [IU] via SUBCUTANEOUS
  Administered 2017-08-15: 7 [IU] via SUBCUTANEOUS
  Administered 2017-08-15: 11 [IU] via SUBCUTANEOUS
  Administered 2017-08-15: 4 [IU] via SUBCUTANEOUS
  Administered 2017-08-16 (×3): 7 [IU] via SUBCUTANEOUS

## 2017-08-14 MED ORDER — INSULIN ASPART 100 UNIT/ML ~~LOC~~ SOLN
0.0000 [IU] | Freq: Every day | SUBCUTANEOUS | Status: DC
  Administered 2017-08-15: 2 [IU] via SUBCUTANEOUS

## 2017-08-14 MED ORDER — HYDRALAZINE HCL 20 MG/ML IJ SOLN
10.0000 mg | Freq: Four times a day (QID) | INTRAMUSCULAR | Status: DC | PRN
  Administered 2017-08-14 – 2017-08-16 (×3): 10 mg via INTRAVENOUS
  Filled 2017-08-14 (×3): qty 1

## 2017-08-14 NOTE — Progress Notes (Signed)
PROGRESS NOTE    Jacqueline BELFLOWER  OZD:[id number] DOB: May 03, 1950 DOA: 08/12/2017 PCP: [name], MD   Outpatient Specialists:     Brief Narrative:  [name]  is a 67 y.o. female, w Asthma , CHF (EF 65-70%) Mild MR, CAD s/p NSTEMI  S/p PTCA/DES x2 mid and distal RCA 01/18/2016 apparently c/o dyspnea starting about 3 days ago. Slight dry cough and wheezing.  Slight left sided chest pain " sharp" without radiation , under the left breast for the past 1 day, "constant".  Nothing appeared to make better or worse.  Pt notes slight lower ext edema but denies orthopnea, pnd.  Pt presented due to dyspnea.  Weight stable.       Assessment & Plan:   Principal Problem:   Dyspnea Active Problems:   Diabetes mellitus type 2, uncontrolled (HCC)   Essential hypertension   Asthma exacerbation   Hypokalemia   Anemia   Dyspnea due to asthma exacerbation -unable to further wean solumedrol (moving more air but lots of wheezing) -patient not good with taking scheduled inhalers at home spiriva 1puff qday Symbicort 2puff bid=> Dulera  Albuterol nebs PRN  Chest pain -cardiology consult appreciated -BB changed  Chronic diastolic CHF (25-95%) Torsemide 20mg  po qday-- patient takes only PRN for swelling Cont Irbesartan 300mg  po qday metoprolol XL 25mg  changed to bistolic  Hypokalemia Repleted  Hypertension uncontrolled -increase home meds as high last PM -PRN   DM2 Cont Metformin 500mg  po bid SSI-- resistant schedule  Gerd Cont PPI     DVT prophylaxis:  Lovenox   Code Status: Full Code   Family Communication:   Disposition Plan:     Consultants:  cards  Subjective: Not feeling well today-- looks uncomfortable  Objective: Vitals:   08/14/17 0542 08/14/17 0924 08/14/17 0946 08/14/17 1149  BP: (!) 187/85  (!) 186/87 (!) 183/86  Pulse: 72  70 75  Resp: 18   18  Temp: 98.3 F (36.8 C)   97.9 F (36.6 C)  TempSrc: Oral   Oral  SpO2: 98% 97% 97%  99%  Weight: 69.9 kg (154 lb 3.2 oz)     Height:        Intake/Output Summary (Last 24 hours) at 08/14/2017 1205 Last data filed at 08/14/2017 1005 Gross per 24 hour  Intake 973 ml  Output 800 ml  Net 173 ml   Filed Weights   08/12/17 1809 08/13/17 0528 08/14/17 0542  Weight: 70.9 kg (156 lb 3.2 oz) 70.2 kg (154 lb 11.2 oz) 69.9 kg (154 lb 3.2 oz)    Examination:  General exam: looks uncomfortable Respiratory system: moving more air, wheezing Cardiovascular system: rrr Gastrointestinal system: +Bs, soft Central nervous system: alert Extremities: Symmetric 5 x 5 power. Skin: No rashes, lesions or ulcers Psychiatry: Judgement and insight appear normal. Mood & affect appropriate.     Data Reviewed: I have personally reviewed following labs and imaging studies  CBC: Recent Labs  Lab 08/12/17 0544 08/13/17 0350 08/14/17 0650  WBC 9.9 11.9* 17.4*  NEUTROABS 5.2  --   --   HGB 11.2* 12.2 11.5*  HCT 34.3* 37.5 35.3*  MCV 97.7 97.2 96.2  PLT 311 385 638   Basic Metabolic Panel: Recent Labs  Lab 08/12/17 0544 08/13/17 0350 08/14/17 0650  NA 138 138 135  K 3.2* 3.8 3.9  CL 105 100* 99*  CO2 22 23 25   GLUCOSE 118* 184* 199*  BUN 16 29* 30*  CREATININE 0.83 1.25* 1.01*  CALCIUM  8.3* 8.3* 8.1*   GFR: Estimated Creatinine Clearance: 48.8 mL/min (A) (by C-G formula based on SCr of 1.01 mg/dL (H)). Liver Function Tests: Recent Labs  Lab 08/13/17 0350  AST 21  ALT 15  ALKPHOS 54  BILITOT 0.4  PROT 7.3  ALBUMIN 3.3*   No results for input(s): LIPASE, AMYLASE in the last 168 hours. No results for input(s): AMMONIA in the last 168 hours. Coagulation Profile: No results for input(s): INR, PROTIME in the last 168 hours. Cardiac Enzymes: Recent Labs  Lab 08/12/17 0544 08/12/17 1647 08/12/17 2045 08/13/17 0350  TROPONINI <0.03 <0.03 <0.03 <0.03   BNP (last 3 results) No results for input(s): PROBNP in the last 8760 hours. HbA1C: No results for input(s):  HGBA1C in the last 72 hours. CBG: Recent Labs  Lab 08/13/17 1130 08/13/17 1711 08/13/17 2116 08/14/17 0738 08/14/17 1145  GLUCAP 234* 170* 186* 199* 192*   Lipid Profile: No results for input(s): CHOL, HDL, LDLCALC, TRIG, CHOLHDL, LDLDIRECT in the last 72 hours. Thyroid Function Tests: No results for input(s): TSH, T4TOTAL, FREET4, T3FREE, THYROIDAB in the last 72 hours. Anemia Panel: No results for input(s): VITAMINB12, FOLATE, FERRITIN, TIBC, IRON, RETICCTPCT in the last 72 hours. Urine analysis:    Component Value Date/Time   COLORURINE YELLOW 08/07/2014 1304   APPEARANCEUR CLEAR 08/07/2014 1304   LABSPEC 1.012 08/07/2014 1304   PHURINE 5.0 08/07/2014 1304   GLUCOSEU NEGATIVE 08/07/2014 1304   HGBUR NEGATIVE 08/07/2014 1304   BILIRUBINUR NEGATIVE 08/07/2014 1304   KETONESUR NEGATIVE 08/07/2014 1304   PROTEINUR NEGATIVE 08/07/2014 1304   UROBILINOGEN 0.2 08/07/2014 1304   NITRITE NEGATIVE 08/07/2014 1304   LEUKOCYTESUR NEGATIVE 08/07/2014 1304     )No results found for this or any previous visit (from the past 240 hour(s)).    Anti-infectives (From admission, onward)   None       Radiology Studies: No results found.      Scheduled Meds: . aspirin EC  81 mg Oral Daily  . atorvastatin  40 mg Oral Daily  . [START ON 08/15/2017] diltiazem  240 mg Oral Daily  . divalproex  1,000 mg Oral QHS  . enoxaparin (LOVENOX) injection  40 mg Subcutaneous Q24H  . estradiol  1 mg Oral Daily  . famotidine  10 mg Oral QHS  . insulin aspart  0-20 Units Subcutaneous TID WC  . insulin aspart  0-5 Units Subcutaneous QHS  . irbesartan  300 mg Oral Daily  . loratadine  10 mg Oral Daily  . metFORMIN  500 mg Oral BID WC  . methylPREDNISolone (SOLU-MEDROL) injection  60 mg Intravenous Q8H  . mometasone-formoterol  2 puff Inhalation BID  . nebivolol  2.5 mg Oral Daily  . pantoprazole  40 mg Oral Daily  . potassium chloride SA  20 mEq Oral Daily  . sodium chloride flush  3 mL  Intravenous Q12H  . tiotropium  1 capsule Inhalation Daily  . torsemide  20 mg Oral Daily   Continuous Infusions: . sodium chloride       LOS: 2 days    Time spent: 25 min    Geradine Girt, DO [facility] Pager [phone]  If 7PM-7AM, please contact night-coverage [email or web address] Password [facility] 08/14/2017, 12:05 PM

## 2017-08-14 NOTE — Progress Notes (Signed)
Paged Forrest Moron Np to notify of patient's Bp 187/85. Awaiting new orders. Will continue to monitor.

## 2017-08-15 LAB — GLUCOSE, CAPILLARY
GLUCOSE-CAPILLARY: 223 mg/dL — AB (ref 65–99)
Glucose-Capillary: 206 mg/dL — ABNORMAL HIGH (ref 65–99)
Glucose-Capillary: 258 mg/dL — ABNORMAL HIGH (ref 65–99)
Glucose-Capillary: 198 mg/dL — ABNORMAL HIGH (ref 65–99)

## 2017-08-15 MED ORDER — GUAIFENESIN ER 600 MG PO TB12
600.0000 mg | ORAL_TABLET | Freq: Two times a day (BID) | ORAL | Status: DC
  Administered 2017-08-15 – 2017-08-17 (×4): 600 mg via ORAL
  Filled 2017-08-15 (×4): qty 1

## 2017-08-15 MED ORDER — METHYLPREDNISOLONE SODIUM SUCC 40 MG IJ SOLR
40.0000 mg | Freq: Two times a day (BID) | INTRAMUSCULAR | Status: DC
  Administered 2017-08-15 (×2): 40 mg via INTRAVENOUS
  Filled 2017-08-15 (×2): qty 1

## 2017-08-15 MED ORDER — HYDRALAZINE HCL 25 MG PO TABS
25.0000 mg | ORAL_TABLET | Freq: Four times a day (QID) | ORAL | Status: DC
  Administered 2017-08-15 – 2017-08-17 (×9): 25 mg via ORAL
  Filled 2017-08-15 (×9): qty 1

## 2017-08-15 MED ORDER — INSULIN ASPART 100 UNIT/ML ~~LOC~~ SOLN
4.0000 [IU] | Freq: Three times a day (TID) | SUBCUTANEOUS | Status: DC
  Administered 2017-08-15 – 2017-08-16 (×4): 4 [IU] via SUBCUTANEOUS

## 2017-08-15 NOTE — Progress Notes (Signed)
PROGRESS NOTE    [name]  ERX:[id number] DOB: 07/24/1950 DOA: 08/12/2017 PCP: [name], MD   Outpatient Specialists:     Brief Narrative:  [name]  is a 67 y.o. female, w Asthma , CHF (EF 65-70%) Mild MR, CAD s/p NSTEMI  S/p PTCA/DES x2 mid and distal RCA 01/18/2016 apparently c/o dyspnea starting about 3 days ago. Slight dry cough and wheezing.  Slight left sided chest pain " sharp" without radiation , under the left breast for the past 1 day, "constant".  Nothing appeared to make better or worse.  Pt notes slight lower ext edema but denies orthopnea, pnd.  Pt presented due to dyspnea.  Weight stable.       Assessment & Plan:   Principal Problem:   Dyspnea Active Problems:   Diabetes mellitus type 2, uncontrolled (HCC)   Essential hypertension   Asthma exacerbation   Hypokalemia   Anemia   Dyspnea due to asthma exacerbation -wean solumedrol today -patient not good with taking scheduled inhalers at home spiriva 1puff qday Symbicort 2puff bid=> Dulera  Albuterol nebs PRN  Chest pain -cardiology consult appreciated -BB changed  Chronic diastolic CHF (95-09%) Torsemide 20mg  po qday-- patient takes only PRN for swelling Cont Irbesartan 300mg  po qday metoprolol XL 25mg  changed to bistolic -add hydralazine  Hypokalemia Repleted  Hypertension uncontrolled -continue to titrate BP meds -add hydralazine   DM2 Cont Metformin 500mg  po bid SSI-- resistant schedule add meal coverage while on steroids  Gerd Cont PPI     DVT prophylaxis:  Lovenox   Code Status: Full Code   Family Communication: Son at bedside  Disposition Plan:  Home 24-48 hours   Consultants:  cards  Subjective: Denies chest pain and headaches  Objective: Vitals:   08/15/17 0828 08/15/17 1109 08/15/17 1115 08/15/17 1151  BP:  (!) 196/79 (!) 195/83 (!) 172/87  Pulse: 69 71 72 79  Resp: 20   18  Temp:    97.7 F (36.5 C)  TempSrc:    Oral  SpO2: 92%  100% 100% 100%  Weight:      Height:        Intake/Output Summary (Last 24 hours) at 08/15/2017 1359 Last data filed at 08/15/2017 1225 Gross per 24 hour  Intake 960 ml  Output 2100 ml  Net -1140 ml   Filed Weights   08/13/17 0528 08/14/17 0542 08/15/17 0513  Weight: 70.2 kg (154 lb 11.2 oz) 69.9 kg (154 lb 3.2 oz) 71 kg (156 lb 9.6 oz)    Examination:  General exam: NAD, in chair Respiratory system: no wheezing, diminished but lung sound throughout Cardiovascular system: rrr Gastrointestinal system: +Bs, soft Central nervous system: alert Extremities: no LE edema     Data Reviewed: I have personally reviewed following labs and imaging studies  CBC: Recent Labs  Lab 08/12/17 0544 08/13/17 0350 08/14/17 0650  WBC 9.9 11.9* 17.4*  NEUTROABS 5.2  --   --   HGB 11.2* 12.2 11.5*  HCT 34.3* 37.5 35.3*  MCV 97.7 97.2 96.2  PLT 311 385 326   Basic Metabolic Panel: Recent Labs  Lab 08/12/17 0544 08/13/17 0350 08/14/17 0650  NA 138 138 135  K 3.2* 3.8 3.9  CL 105 100* 99*  CO2 22 23 25   GLUCOSE 118* 184* 199*  BUN 16 29* 30*  CREATININE 0.83 1.25* 1.01*  CALCIUM 8.3* 8.3* 8.1*   GFR: Estimated Creatinine Clearance: 49.2 mL/min (A) (by C-G formula based on SCr of 1.01 mg/dL (  H)). Liver Function Tests: Recent Labs  Lab 08/13/17 0350  AST 21  ALT 15  ALKPHOS 54  BILITOT 0.4  PROT 7.3  ALBUMIN 3.3*   No results for input(s): LIPASE, AMYLASE in the last 168 hours. No results for input(s): AMMONIA in the last 168 hours. Coagulation Profile: No results for input(s): INR, PROTIME in the last 168 hours. Cardiac Enzymes: Recent Labs  Lab 08/12/17 0544 08/12/17 1647 08/12/17 2045 08/13/17 0350  TROPONINI <0.03 <0.03 <0.03 <0.03   BNP (last 3 results) No results for input(s): PROBNP in the last 8760 hours. HbA1C: No results for input(s): HGBA1C in the last 72 hours. CBG: Recent Labs  Lab 08/14/17 1145 08/14/17 1613 08/14/17 2038 08/15/17 0731  08/15/17 1124  GLUCAP 192* 314* 170* 206* 258*   Lipid Profile: No results for input(s): CHOL, HDL, LDLCALC, TRIG, CHOLHDL, LDLDIRECT in the last 72 hours. Thyroid Function Tests: No results for input(s): TSH, T4TOTAL, FREET4, T3FREE, THYROIDAB in the last 72 hours. Anemia Panel: No results for input(s): VITAMINB12, FOLATE, FERRITIN, TIBC, IRON, RETICCTPCT in the last 72 hours. Urine analysis:    Component Value Date/Time   COLORURINE YELLOW 08/07/2014 1304   APPEARANCEUR CLEAR 08/07/2014 1304   LABSPEC 1.012 08/07/2014 1304   PHURINE 5.0 08/07/2014 1304   GLUCOSEU NEGATIVE 08/07/2014 1304   HGBUR NEGATIVE 08/07/2014 1304   BILIRUBINUR NEGATIVE 08/07/2014 1304   KETONESUR NEGATIVE 08/07/2014 1304   PROTEINUR NEGATIVE 08/07/2014 1304   UROBILINOGEN 0.2 08/07/2014 1304   NITRITE NEGATIVE 08/07/2014 1304   LEUKOCYTESUR NEGATIVE 08/07/2014 1304     )No results found for this or any previous visit (from the past 240 hour(s)).    Anti-infectives (From admission, onward)   None       Radiology Studies: No results found.      Scheduled Meds: . aspirin EC  81 mg Oral Daily  . atorvastatin  40 mg Oral Daily  . diltiazem  240 mg Oral Daily  . divalproex  1,000 mg Oral QHS  . enoxaparin (LOVENOX) injection  40 mg Subcutaneous Q24H  . estradiol  1 mg Oral Daily  . famotidine  10 mg Oral QHS  . hydrALAZINE  25 mg Oral Q6H  . insulin aspart  0-20 Units Subcutaneous TID WC  . insulin aspart  0-5 Units Subcutaneous QHS  . insulin aspart  4 Units Subcutaneous TID WC  . irbesartan  300 mg Oral Daily  . loratadine  10 mg Oral Daily  . metFORMIN  500 mg Oral BID WC  . methylPREDNISolone (SOLU-MEDROL) injection  40 mg Intravenous Q12H  . mometasone-formoterol  2 puff Inhalation BID  . nebivolol  2.5 mg Oral Daily  . pantoprazole  40 mg Oral Daily  . potassium chloride SA  20 mEq Oral Daily  . sodium chloride flush  3 mL Intravenous Q12H  . tiotropium  1 capsule Inhalation  Daily  . torsemide  20 mg Oral Daily   Continuous Infusions: . sodium chloride       LOS: 3 days    Time spent: 25 min    Geradine Girt, DO [facility] Pager [phone]  If 7PM-7AM, please contact night-coverage [email or web address] Password [email or web address] 08/15/2017, 1:59 PM

## 2017-08-15 NOTE — Progress Notes (Signed)
Inpatient Diabetes Program Recommendations  AACE/ADA: New Consensus Statement on Inpatient Glycemic Control (2015)  Target Ranges:  Prepandial:   less than 140 mg/dL      Peak postprandial:   less than 180 mg/dL (1-2 hours)      Critically ill patients:  140 - 180 mg/dL   Lab Results  Component Value Date   GLUCAP 258 (H) 08/15/2017   HGBA1C 6.6 (H) 07/18/2016    Review of Glycemic Control Results for [name], [name] (MRN [id number]) as of 08/15/2017 13:25  Ref. Range 08/14/2017 20:38 08/15/2017 07:31 08/15/2017 11:24  Glucose-Capillary Latest Ref Range: 65 - 99 mg/dL 170 (H) 206 (H) 258 (H)   Diabetes history: Type 2 DM Outpatient Diabetes medications: Metformin 500 mg BID Current orders for Inpatient glycemic control: Novolog 0-20 units TID, Novolog 0-5 units QHS, Metformin 500 mg BID, Solumedrol 40 mg Q12H  Inpatient Diabetes Program Recommendations:    Noted decrease of Solumedrol from 60 mg down to 40 mg Q12H. May need additional insulin adjustments as steroids are tapered.  Consider adding Novolog 4 units TID (assuming patient consumes >50% of meal).   Thanks, Bronson Curb, MSN, RNC-OB Diabetes Coordinator [phone] (8a-5p)

## 2017-08-16 LAB — BASIC METABOLIC PANEL
Anion gap: 10 (ref 5–15)
BUN: 36 mg/dL — AB (ref 6–20)
CALCIUM: 7.9 mg/dL — AB (ref 8.9–10.3)
CO2: 26 mmol/L (ref 22–32)
Chloride: 97 mmol/L — ABNORMAL LOW (ref 101–111)
Creatinine, Ser: 1.21 mg/dL — ABNORMAL HIGH (ref 0.44–1.00)
GFR calc Af Amer: 52 mL/min — ABNORMAL LOW (ref >60)
GFR, EST NON AFRICAN AMERICAN: 45 mL/min — AB (ref >60)
Glucose, Bld: 227 mg/dL — ABNORMAL HIGH (ref 65–99)
POTASSIUM: 4 mmol/L (ref 3.5–5.1)
SODIUM: 133 mmol/L — AB (ref 135–145)

## 2017-08-16 LAB — GLUCOSE, CAPILLARY
GLUCOSE-CAPILLARY: 220 mg/dL — AB (ref 65–99)
Glucose-Capillary: 227 mg/dL — ABNORMAL HIGH (ref 65–99)
Glucose-Capillary: 247 mg/dL — ABNORMAL HIGH (ref 65–99)
Glucose-Capillary: 169 mg/dL — ABNORMAL HIGH (ref 65–99)

## 2017-08-16 LAB — CBC
HCT: 35.7 % — ABNORMAL LOW (ref 36.0–46.0)
Hemoglobin: 11.7 g/dL — ABNORMAL LOW (ref 12.0–15.0)
MCH: 31.3 pg (ref 26.0–34.0)
MCHC: 32.8 g/dL (ref 30.0–36.0)
MCV: 95.5 fL (ref 78.0–100.0)
Platelets: 359 10*3/uL (ref 150–400)
RBC: 3.74 MIL/uL — AB (ref 3.87–5.11)
RDW: 14.3 % (ref 11.5–15.5)
WBC: 14.1 10*3/uL — AB (ref 4.0–10.5)

## 2017-08-16 MED ORDER — PREDNISONE 20 MG PO TABS: 40.0000 mg | ORAL_TABLET | Freq: Every day | ORAL | Status: DC

## 2017-08-16 NOTE — Progress Notes (Signed)
PROGRESS NOTE    [name]  FYB:[id number] DOB: Apr 15, 1951 DOA: 08/12/2017 PCP: [name], MD   Outpatient Specialists:     Brief Narrative:  [name]  is a 67 y.o. female, w Asthma , CHF (EF 65-70%) Mild MR, CAD s/p NSTEMI  S/p PTCA/DES x2 mid and distal RCA 01/18/2016 apparently c/o dyspnea starting about 3 days ago. Slight dry cough and wheezing.  Slight left sided chest pain " sharp" without radiation , under the left breast for the past 1 day, "constant".  Nothing appeared to make better or worse.  Pt notes slight lower ext edema but denies orthopnea, pnd.  Pt presented due to dyspnea.  Weight stable.       Assessment & Plan:   Principal Problem:   Dyspnea Active Problems:   Diabetes mellitus type 2, uncontrolled (HCC)   Essential hypertension   Asthma exacerbation   Hypokalemia   Anemia   Dyspnea due to asthma exacerbation + suspected deconditioning -wean solumedrol today to PO prednisone in AM -patient not good with taking scheduled inhalers at home Symbicort 2puff bid=> Dulera  Albuterol nebs PRN -home O2 eval  Chest pain -cardiology consult appreciated -BB changed  Chronic diastolic CHF (52-77%) Torsemide 20mg  po qday Cont Irbesartan 300mg  po qday metoprolol XL 25mg  changed to bistolic -added hydralazine  Hypokalemia Repleted  Hypertension uncontrolled -continue to titrate BP meds -added hydralazine with improvement   DM2 Cont Metformin 500mg  po bid SSI-- resistant schedule add meal coverage while on steroids  Gerd Cont PPI     DVT prophylaxis:  Lovenox   Code Status: Full Code   Family Communication: Son at bedside 4/17  Disposition Plan:  Home in AM on prednisone taper   Consultants:  cards  Subjective: Continues to be winded when up walking a few steps to the bathroom  Objective: Vitals:   08/16/17 0813 08/16/17 0820 08/16/17 1200 08/16/17 1227  BP: (!) 144/76 (!) 144/76    Pulse: 82 82    Resp:  18 18    Temp: 98 F (36.7 C) 98 F (36.7 C)    TempSrc: Oral Oral    SpO2: 100% 100% 99% 96%  Weight:      Height:        Intake/Output Summary (Last 24 hours) at 08/16/2017 1328 Last data filed at 08/16/2017 1129 Gross per 24 hour  Intake 440 ml  Output 1803 ml  Net -1363 ml   Filed Weights   08/14/17 0542 08/15/17 0513 08/16/17 0500  Weight: 69.9 kg (154 lb 3.2 oz) 71 kg (156 lb 9.6 oz) 70.8 kg (156 lb)    Examination:  General exam: in chair, NAD Respiratory system: moving more air with no wheezing Cardiovascular system: rrr Gastrointestinal system: +Bs, soft Extremities: no edema     Data Reviewed: I have personally reviewed following labs and imaging studies  CBC: Recent Labs  Lab 08/12/17 0544 08/13/17 0350 08/14/17 0650 08/16/17 0451  WBC 9.9 11.9* 17.4* 14.1*  NEUTROABS 5.2  --   --   --   HGB 11.2* 12.2 11.5* 11.7*  HCT 34.3* 37.5 35.3* 35.7*  MCV 97.7 97.2 96.2 95.5  PLT 311 385 383 824   Basic Metabolic Panel: Recent Labs  Lab 08/12/17 0544 08/13/17 0350 08/14/17 0650 08/16/17 0451  NA 138 138 135 133*  K 3.2* 3.8 3.9 4.0  CL 105 100* 99* 97*  CO2 22 23 25 26   GLUCOSE 118* 184* 199* 227*  BUN 16 29* 30* 36*  CREATININE 0.83 1.25* 1.01* 1.21*  CALCIUM 8.3* 8.3* 8.1* 7.9*   GFR: Estimated Creatinine Clearance: 41 mL/min (A) (by C-G formula based on SCr of 1.21 mg/dL (H)). Liver Function Tests: Recent Labs  Lab 08/13/17 0350  AST 21  ALT 15  ALKPHOS 54  BILITOT 0.4  PROT 7.3  ALBUMIN 3.3*   No results for input(s): LIPASE, AMYLASE in the last 168 hours. No results for input(s): AMMONIA in the last 168 hours. Coagulation Profile: No results for input(s): INR, PROTIME in the last 168 hours. Cardiac Enzymes: Recent Labs  Lab 08/12/17 0544 08/12/17 1647 08/12/17 2045 08/13/17 0350  TROPONINI <0.03 <0.03 <0.03 <0.03   BNP (last 3 results) No results for input(s): PROBNP in the last 8760 hours. HbA1C: No results for  input(s): HGBA1C in the last 72 hours. CBG: Recent Labs  Lab 08/15/17 1124 08/15/17 1657 08/15/17 2133 08/16/17 0747 08/16/17 1207  GLUCAP 258* 198* 223* 220* 227*   Lipid Profile: No results for input(s): CHOL, HDL, LDLCALC, TRIG, CHOLHDL, LDLDIRECT in the last 72 hours. Thyroid Function Tests: No results for input(s): TSH, T4TOTAL, FREET4, T3FREE, THYROIDAB in the last 72 hours. Anemia Panel: No results for input(s): VITAMINB12, FOLATE, FERRITIN, TIBC, IRON, RETICCTPCT in the last 72 hours. Urine analysis:    Component Value Date/Time   COLORURINE YELLOW 08/07/2014 1304   APPEARANCEUR CLEAR 08/07/2014 1304   LABSPEC 1.012 08/07/2014 1304   PHURINE 5.0 08/07/2014 1304   GLUCOSEU NEGATIVE 08/07/2014 1304   HGBUR NEGATIVE 08/07/2014 1304   BILIRUBINUR NEGATIVE 08/07/2014 1304   KETONESUR NEGATIVE 08/07/2014 1304   PROTEINUR NEGATIVE 08/07/2014 1304   UROBILINOGEN 0.2 08/07/2014 1304   NITRITE NEGATIVE 08/07/2014 1304   LEUKOCYTESUR NEGATIVE 08/07/2014 1304     )No results found for this or any previous visit (from the past 240 hour(s)).    Anti-infectives (From admission, onward)   None       Radiology Studies: No results found.      Scheduled Meds: . aspirin EC  81 mg Oral Daily  . atorvastatin  40 mg Oral Daily  . diltiazem  240 mg Oral Daily  . divalproex  1,000 mg Oral QHS  . enoxaparin (LOVENOX) injection  40 mg Subcutaneous Q24H  . estradiol  1 mg Oral Daily  . famotidine  10 mg Oral QHS  . guaiFENesin  600 mg Oral BID  . hydrALAZINE  25 mg Oral Q6H  . insulin aspart  0-20 Units Subcutaneous TID WC  . insulin aspart  0-5 Units Subcutaneous QHS  . insulin aspart  4 Units Subcutaneous TID WC  . irbesartan  300 mg Oral Daily  . loratadine  10 mg Oral Daily  . metFORMIN  500 mg Oral BID WC  . mometasone-formoterol  2 puff Inhalation BID  . nebivolol  2.5 mg Oral Daily  . pantoprazole  40 mg Oral Daily  . potassium chloride SA  20 mEq Oral Daily    . [START ON 08/17/2017] predniSONE  40 mg Oral Q breakfast  . sodium chloride flush  3 mL Intravenous Q12H  . torsemide  20 mg Oral Daily   Continuous Infusions: . sodium chloride       LOS: 4 days    Time spent: 25 min    Geradine Girt, DO [facility] Pager [phone]  If 7PM-7AM, please contact night-coverage [email or web address] Password [email or web address] 08/16/2017, 1:28 PM

## 2017-08-16 NOTE — Care Management Important Message (Signed)
Important Message  Patient Details  Name: [name] MRN: [id number] Date of Birth: [birth year]   Medicare Important Message Given:  Yes    Thorin Starner [address] 08/16/2017, 2:49 PM

## 2017-08-16 NOTE — Progress Notes (Signed)
Patient is on a imprortant phone call  told her the plan was to walk and check pulseox. Will go back to walk patient. Per MD orders

## 2017-08-17 LAB — GLUCOSE, CAPILLARY: GLUCOSE-CAPILLARY: 149 mg/dL — AB (ref 65–99)

## 2017-08-17 MED ORDER — DILTIAZEM HCL ER COATED BEADS 240 MG PO CP24: 240.0000 mg | ORAL_CAPSULE | Freq: Every day | ORAL | 0 refills | Status: DC

## 2017-08-17 MED ORDER — PREDNISONE 20 MG PO TABS
40.0000 mg | ORAL_TABLET | Freq: Every day | ORAL | Status: DC
  Administered 2017-08-17: 40 mg via ORAL
  Filled 2017-08-17: qty 2

## 2017-08-17 MED ORDER — HYDRALAZINE HCL 25 MG PO TABS: 25.0000 mg | ORAL_TABLET | Freq: Three times a day (TID) | ORAL | 0 refills | Status: DC

## 2017-08-17 MED ORDER — LORATADINE 10 MG PO TABS: 10.0000 mg | ORAL_TABLET | Freq: Every day | ORAL | 0 refills | Status: DC

## 2017-08-17 MED ORDER — GUAIFENESIN-DM 100-10 MG/5ML PO SYRP: 5.0000 mL | ORAL_SOLUTION | ORAL | 0 refills | Status: DC | PRN

## 2017-08-17 MED ORDER — INSULIN ASPART 100 UNIT/ML ~~LOC~~ SOLN
0.0000 [IU] | Freq: Three times a day (TID) | SUBCUTANEOUS | Status: DC
  Administered 2017-08-17: 3 [IU] via SUBCUTANEOUS

## 2017-08-17 MED ORDER — INSULIN ASPART 100 UNIT/ML ~~LOC~~ SOLN
4.0000 [IU] | Freq: Three times a day (TID) | SUBCUTANEOUS | Status: DC
Start: 2017-08-17 — End: 2017-08-17
  Administered 2017-08-17: 4 [IU] via SUBCUTANEOUS

## 2017-08-17 MED ORDER — PREDNISONE 10 MG PO TABS: ORAL_TABLET | ORAL | 0 refills | Status: DC

## 2017-08-17 MED ORDER — METFORMIN HCL 500 MG PO TABS: 1000.0000 mg | ORAL_TABLET | Freq: Two times a day (BID) | ORAL | 0 refills | Status: DC

## 2017-08-17 MED ORDER — NEBIVOLOL HCL 2.5 MG PO TABS: 2.5000 mg | ORAL_TABLET | Freq: Every day | ORAL | 0 refills | Status: DC

## 2017-08-17 NOTE — Discharge Summary (Signed)
Physician Discharge Summary  [name] GYI:[id number] DOB: 1950/08/01 DOA: 08/12/2017  PCP: [name], MD  Admit date: 08/12/2017 Discharge date: 08/17/2017   Recommendations for Outpatient Follow-Up:   1. outpatient cardiology follow up 2. BMP 1 week re Cr   Discharge Diagnosis:   Principal Problem:   Dyspnea Active Problems:   Diabetes mellitus type 2, uncontrolled (HCC)   Essential hypertension   Asthma exacerbation   Hypokalemia   Anemia   Discharge disposition:  Home  Discharge Condition: Improved.  Diet recommendation: Low sodium, heart healthy.  Carbohydrate-modified  Wound care: None.   History of Present Illness:   [name] a66 y.o.female,w Asthma , CHF (EF 65-70%) Mild MR, CAD s/p NSTEMI S/p PTCA/DES x2 mid and distal RCA 01/18/2016 apparently c/o dyspnea starting about 3 days ago. Slight dry cough and wheezing. Slight left sided chest pain " sharp" without radiation , under the left breast for the past 1 day, "constant". Nothing appeared to make better or worse. Pt notes slight lower ext edema but denies orthopnea, pnd. Pt presented due to dyspnea. Weight stable.      Hospital Course by Problem:   Dyspnea due to asthma exacerbation + suspected deconditioning -weaned solumedrol to PO prednisone with taper -patient not good with taking scheduled inhalers at home-- encourage compliance Symbicort 2puff bid=>Dulera  Albuterol nebs PRN -home O2 eval-- showed no de-saturations  Chest pain -cardiology consult appreciated -BB changed to bistolic -outpatient follow up  Chronic diastolic CHF (35-00%) Torsemide 20mg  po qday Cont Irbesartan 300mg  po qday metoprolol XL 25mg  changed to bistolic  Hypokalemia Repleted  Hypertension uncontrolled -continue to titrate BP meds -added hydralazine with improvement  DM2 Increase metformin while on steroids for better control  Gerd Cont PPI      Medical Consultants:     cards   Discharge Exam:   Vitals:   08/17/17 0020 08/17/17 0708  BP: 138/69 138/70  Pulse: 74 65  Resp:    Temp:    SpO2:  97%   Vitals:   08/16/17 2151 08/17/17 0020 08/17/17 0708 08/17/17 0711  BP: 127/68 138/69 138/70   Pulse: (!) 101 74 65   Resp:      Temp: 97.8 F (36.6 C)     TempSrc: Oral     SpO2: 100%  97%   Weight:    71.3 kg (157 lb 1.6 oz)  Height:        Gen:  NAD- breathing better and feeling over all better    The results of significant diagnostics from this hospitalization (including imaging, microbiology, ancillary and laboratory) are listed below for reference.     Procedures and Diagnostic Studies:   Dg Chest Portable 1 View  Result Date: 08/12/2017 CLINICAL DATA:  Shortness of breath tonight. On bypass. History of CHF, former smoker. EXAM: PORTABLE CHEST 1 VIEW COMPARISON:  Chest radiograph July 30, 2017 FINDINGS: Cardiac silhouette is mildly enlarged and unchanged. Bandlike density in the lung bases. No pleural effusion or focal consolidation. No pneumothorax. Soft tissue planes and included osseous structures are nonsuspicious. IMPRESSION: Mild cardiomegaly and bibasilar atelectasis. Electronically Signed   By: Elon Alas M.D.   On: 08/12/2017 06:06     Labs:   Basic Metabolic Panel: Recent Labs  Lab 08/12/17 0544 08/13/17 0350 08/14/17 0650 08/16/17 0451  NA 138 138 135 133*  K 3.2* 3.8 3.9 4.0  CL 105 100* 99* 97*  CO2 22 23 25 26   GLUCOSE 118* 184* 199* 227*  BUN  16 29* 30* 36*  CREATININE 0.83 1.25* 1.01* 1.21*  CALCIUM 8.3* 8.3* 8.1* 7.9*   GFR Estimated Creatinine Clearance: 41.2 mL/min (A) (by C-G formula based on SCr of 1.21 mg/dL (H)). Liver Function Tests: Recent Labs  Lab 08/13/17 0350  AST 21  ALT 15  ALKPHOS 54  BILITOT 0.4  PROT 7.3  ALBUMIN 3.3*   No results for input(s): LIPASE, AMYLASE in the last 168 hours. No results for input(s): AMMONIA in the last 168 hours. Coagulation profile No  results for input(s): INR, PROTIME in the last 168 hours.  CBC: Recent Labs  Lab 08/12/17 0544 08/13/17 0350 08/14/17 0650 08/16/17 0451  WBC 9.9 11.9* 17.4* 14.1*  NEUTROABS 5.2  --   --   --   HGB 11.2* 12.2 11.5* 11.7*  HCT 34.3* 37.5 35.3* 35.7*  MCV 97.7 97.2 96.2 95.5  PLT 311 385 383 359   Cardiac Enzymes: Recent Labs  Lab 08/12/17 0544 08/12/17 1647 08/12/17 2045 08/13/17 0350  TROPONINI <0.03 <0.03 <0.03 <0.03   BNP: Invalid input(s): POCBNP CBG: Recent Labs  Lab 08/16/17 0747 08/16/17 1207 08/16/17 1627 08/16/17 2149 08/17/17 0733  GLUCAP 220* 227* 247* 169* 149*   D-Dimer No results for input(s): DDIMER in the last 72 hours. Hgb A1c No results for input(s): HGBA1C in the last 72 hours. Lipid Profile No results for input(s): CHOL, HDL, LDLCALC, TRIG, CHOLHDL, LDLDIRECT in the last 72 hours. Thyroid function studies No results for input(s): TSH, T4TOTAL, T3FREE, THYROIDAB in the last 72 hours.  Invalid input(s): FREET3 Anemia work up No results for input(s): VITAMINB12, FOLATE, FERRITIN, TIBC, IRON, RETICCTPCT in the last 72 hours. Microbiology No results found for this or any previous visit (from the past 240 hour(s)).   Discharge Instructions:   Discharge Instructions    Diet - low sodium heart healthy   Complete by:  As directed    Diet Carb Modified   Complete by:  As directed    Discharge instructions   Complete by:  As directed    Increase metformin if tolerated while on steroids as your blood sugars will be elevated from the steroids--- strict diabetic diet   Increase activity slowly   Complete by:  As directed      Allergies as of 08/17/2017      Reactions   Amlodipine Besylate Other (See Comments)   Reaction unknown   Lisinopril Other (See Comments)   Reaction unknown   Pantoprazole Sodium Other (See Comments)   Reaction unknown      Medication List    STOP taking these medications   diltiazem 180 MG 24 hr  capsule Commonly known as:  DILACOR XR Replaced by:  diltiazem 240 MG 24 hr capsule   FLUTTER Devi   furosemide 40 MG tablet Commonly known as:  LASIX   metoprolol succinate 25 MG 24 hr tablet Commonly known as:  TOPROL-XL   Tiotropium Bromide Monohydrate 2.5 MCG/ACT Aers Commonly known as:  SPIRIVA RESPIMAT   valsartan 320 MG tablet Commonly known as:  DIOVAN     TAKE these medications   albuterol (2.5 MG/3ML) 0.083% nebulizer solution Commonly known as:  PROVENTIL Take 3 mLs (2.5 mg total) by nebulization every 4 (four) hours as needed for shortness of breath.   albuterol 108 (90 Base) MCG/ACT inhaler Commonly known as:  PROAIR HFA Inhale 2 puffs into the lungs every 4 (four) hours as needed for wheezing or shortness of breath.   ALPRAZolam 0.5 MG tablet Commonly  known as:  XANAX Take 1 tablet (0.5 mg total) by mouth 2 (two) times daily as needed for anxiety.   aspirin EC 81 MG tablet Take 81 mg by mouth daily.   atorvastatin 40 MG tablet Commonly known as:  LIPITOR Take 40 mg by mouth daily.   Biotin 5000 MCG Caps Take 1 capsule by mouth daily.   budesonide-formoterol 80-4.5 MCG/ACT inhaler Commonly known as:  SYMBICORT Inhale 2 puffs into the lungs 2 (two) times daily.   diltiazem 240 MG 24 hr capsule Commonly known as:  CARDIZEM CD Take 1 capsule (240 mg total) by mouth daily. Start taking on:  08/18/2017 Replaces:  diltiazem 180 MG 24 hr capsule   divalproex 500 MG 24 hr tablet Commonly known as:  DEPAKOTE ER Take 2 tablets (1,000 mg total) by mouth at bedtime.   esomeprazole 40 MG capsule Commonly known as:  NEXIUM Take 1 capsule (40 mg total) by mouth 2 (two) times daily before a meal. Take twice a day for 4days then resume once daily   estradiol 1 MG tablet Commonly known as:  ESTRACE Take 1 mg by mouth daily.   fluticasone 50 MCG/ACT nasal spray Commonly known as:  FLONASE Place 2 sprays into both nostrils daily as needed for allergies or  rhinitis.   guaiFENesin-dextromethorphan 100-10 MG/5ML syrup Commonly known as:  ROBITUSSIN DM Take 5 mLs by mouth every 4 (four) hours as needed for cough.   hydrALAZINE 25 MG tablet Commonly known as:  APRESOLINE Take 1 tablet (25 mg total) by mouth 3 (three) times daily.   irbesartan 300 MG tablet Commonly known as:  AVAPRO   loratadine 10 MG tablet Commonly known as:  CLARITIN Take 1 tablet (10 mg total) by mouth daily. Start taking on:  08/18/2017   metFORMIN 500 MG tablet Commonly known as:  GLUCOPHAGE Take 2 tablets (1,000 mg total) by mouth 2 (two) times daily with a meal. What changed:  how much to take   nebivolol 2.5 MG tablet Commonly known as:  BYSTOLIC Take 1 tablet (2.5 mg total) by mouth daily. Start taking on:  08/18/2017   nitroGLYCERIN 0.4 MG SL tablet Commonly known as:  NITROSTAT Place 0.4 mg under the tongue every 5 (five) minutes as needed for chest pain. x3 as needed   potassium chloride SA 20 MEQ tablet Commonly known as:  K-DUR,KLOR-CON Take 20 mEq by mouth daily.   predniSONE 10 MG tablet Commonly known as:  DELTASONE 40 mg x 1 day then 30 mg x 2 days then 20 mg x 2 days then 10 mg x 2 days then 5 mg x 2 days and stop   ranitidine 150 MG capsule Commonly known as:  ZANTAC Take 150 mg by mouth every evening.   torsemide 20 MG tablet Commonly known as:  DEMADEX 20 mg. 1-2 daily for swelling   traMADol 50 MG tablet Commonly known as:  ULTRAM 50 mg as needed.      Follow-up Information    [name], MD Follow up in 1 week(s).   Specialty:  Family Medicine Why:  BMP Contact information: [place] [address] [phone]        Jerline Pain, MD .   Specialty:  Cardiology Contact information: [address] [place] [address] [phone]            Time coordinating discharge: 35 min  Signed:  Geradine Girt   [facility] 08/17/2017,  10:04  AM

## 2017-08-27 ENCOUNTER — Ambulatory Visit: Payer: Medicare Other | Admitting: Nurse Practitioner

## 2017-08-27 DIAGNOSIS — R0989 Other specified symptoms and signs involving the circulatory and respiratory systems: Secondary | ICD-10-CM

## 2017-08-27 NOTE — Progress Notes (Deleted)
CARDIOLOGY OFFICE NOTE  Date:  08/27/2017    [name] Date of Birth: 1951/03/07 Medical Record #[id number]  PCP:  [name], MD  Cardiologist:  Servando Snare & ***    No chief complaint on file.   History of Present Illness: [name] is a 67 y.o. female who presents today for a ***   with past medical history of recurrent asthma, CAD s/p DES x2 to mid to distal RCA in September 3976, chronic diastolic heart failure, hypertension, hyperlipidemia and DM 2.  Patient does not remember her previous anginal symptom in 2017.  She works in Nurse, children's.  Her last office visit with Dr. Marlou Porch was on 08/11/2016, she was doing okay at the time.  In the past 6 months, she has had at least 3 episodes of recurrent asthma exacerbation.  The latest episode started 3-4 days ago.  She eventually sought medical attention at [facility] on 08/12/2017 and was admitted to internal medicine service.  She complained of 2 types of chest pain, the first type is substernal chest discomfort that only occurs with cough and clearly pleuritic in nature.  There was a second type of chest pain that has been occurring in the past 3-4 days under her left breast.  It is not associated with shortness of breath, and it does not exacerbate with ambulation.  There is no exacerbating factors such as deep inspiration, body rotation or palpation.  Cardiology has been consulted for chest pain.  She says the chest pain under her left breast come and goes and does not last long.  She has not taken any nitroglycerin for it.  First troponin is negative.  BNP is 46.4.  Potassium level low at 3.2.  Hemoglobin 11.2.  Chest x-ray showed bibasilar atelectasis.  EKG showed sinus rhythm with no significant ST-T wave changes.            w Asthma , CHF (EF 65-70%) Mild MR, CAD s/p NSTEMI S/p PTCA/DES x2 mid and distal RCA 01/18/2016 apparently c/o dyspnea starting about 3 days ago. Slight dry cough and  wheezing. Slight left sided chest pain " sharp" without radiation , under the left breast for the past 1 day, "constant". Nothing appeared to make better or worse. Pt notes slight lower ext edema but denies orthopnea, pnd. Pt presented due to dyspnea. Weight stable.      Hospital Course by Problem:   Dyspnea due to asthma exacerbation+ suspected deconditioning -weaned solumedrol to PO prednisone with taper -patient not good with taking scheduled inhalers at home-- encourage compliance Symbicort 2puff bid=>Dulera  Albuterol nebs PRN -home O2 eval-- showed no de-saturations  Chest pain -cardiology consult appreciated -BB changed to bistolic -outpatient follow up  Chronic diastolic CHF (73-41%) Torsemide 20mg  po qday Cont Irbesartan 300mg  po qday metoprolol XL 25mg  changed to bistolic  Hypokalemia Repleted  Hypertension uncontrolled -continue to titrate BP meds -addedhydralazine with improvement  DM2 Increase metformin while on steroids for better control  Gerd Cont PPI       Comes in today. Here with   Past Medical History:  Diagnosis Date  . Anxiety   . Asthma   . Bronchitis   . Chronic diastolic CHF (congestive heart failure) ([place])   . Chronic diastolic CHF (congestive heart failure) ([place]) 07/17/2016  . Coronary artery disease    a. NSTEMI 12/2015 - LHC 01/18/16: s/p overlapping DESx2 to RCA, 10% ost-prox Cx,10% mLAD.  Marland Kitchen Depression   . Diabetes mellitus   .  Dyslipidemia   . GERD (gastroesophageal reflux disease)   . Heart attack ([place])   . Hypertension   . Hypertensive heart disease   . NSTEMI (non-ST elevated myocardial infarction) ([place]) 12/2015  . Pneumonia     Past Surgical History:  Procedure Laterality Date  . ABDOMINAL HYSTERECTOMY    . CARDIAC CATHETERIZATION  1994   minimal LAD dz, no other dz, EF normal  . CARDIAC CATHETERIZATION N/A 01/18/2016   Procedure: Left Heart Cath and Coronary Angiography;  Surgeon: Burnell Blanks, MD;  Location: [facility];  Service: Cardiovascular;  Laterality: N/A;  . CARDIAC CATHETERIZATION N/A 01/18/2016   Procedure: Coronary Stent Intervention;  Surgeon: Burnell Blanks, MD;  Location: [facility];  Service: Cardiovascular;  Laterality: N/A;  . COLONOSCOPY WITH PROPOFOL N/A 04/09/2014   Procedure: COLONOSCOPY WITH PROPOFOL;  Surgeon: Cleotis Nipper, MD;  Location: [facility];  Service: Endoscopy;  Laterality: N/A;  . CORONARY STENT PLACEMENT  01/19/2016   STENT SYNERGY DES [id number] drug eluting stent was successfully placed, and overlaps the 2.5 x 38 mm Synergy stent placed distally.  . ESOPHAGOGASTRODUODENOSCOPY (EGD) WITH PROPOFOL N/A 04/09/2014   Procedure: ESOPHAGOGASTRODUODENOSCOPY (EGD) WITH PROPOFOL;  Surgeon: Cleotis Nipper, MD;  [facility]: [facility];  Service: Endoscopy;  Laterality: N/A;  . HEMORRHOID SURGERY    . HERNIA REPAIR    . KNEE ARTHROSCOPY       Medications: No outpatient medications have been marked as taking for the 08/27/17 encounter (Appointment) with Burtis Junes, NP.     Allergies: Allergies  Allergen Reactions  . Amlodipine Besylate Other (See Comments)    Reaction unknown  . Lisinopril Other (See Comments)    Reaction unknown  . Pantoprazole Sodium Other (See Comments)    Reaction unknown    Social History: The patient  reports that she quit smoking about 13 years ago. Her smoking use included cigarettes. She has a 30.00 pack-year smoking history. She has never used smokeless tobacco. She reports that she does not drink alcohol or use drugs.   Family History: The patient's ***family history includes Allergies in her mother; Asthma in her mother; CAD (age of onset: 15) in her father; CAD (age of onset: 17) in her sister; Congestive Heart Failure in her sister; Diabetes in her father; Hypertension in her father.   Review of Systems: Please see the history of present illness.   Otherwise, the review of  systems is positive for {NONE DEFAULTED:18576::"none"}.   All other systems are reviewed and negative.   Physical Exam: VS:  LMP  (LMP Unknown)  .  BMI There is no height or weight on file to calculate BMI.  Wt Readings from Last 3 Encounters:  08/17/17 157 lb 1.6 oz (71.3 kg)  05/17/17 151 lb 3.2 oz (68.6 kg)  11/14/16 157 lb (71.2 kg)    General: Pleasant. Well developed, well nourished and in no acute distress.   HEENT: Normal.  Neck: Supple, no JVD, carotid bruits, or masses noted.  Cardiac: ***Regular rate and rhythm. No murmurs, rubs, or gallops. No edema.  Respiratory:  Lungs are clear to auscultation bilaterally with normal work of breathing.  GI: Soft and nontender.  MS: No deformity or atrophy. Gait and ROM intact.  Skin: Warm and dry. Color is normal.  Neuro:  Strength and sensation are intact and no gross focal deficits noted.  Psych: Alert, appropriate and with normal affect.   LABORATORY DATA:  EKG:  EKG {ACTION; IS/IS  UXN:[id number]} ordered today. This demonstrates ***.  Lab Results  Component Value Date   WBC 14.1 (H) 08/16/2017   HGB 11.7 (L) 08/16/2017   HCT 35.7 (L) 08/16/2017   PLT 359 08/16/2017   GLUCOSE 227 (H) 08/16/2017   CHOL 196 07/18/2016   TRIG 203 (H) 07/18/2016   HDL 48 07/18/2016   LDLCALC 107 (H) 07/18/2016   ALT 15 08/13/2017   AST 21 08/13/2017   NA 133 (L) 08/16/2017   K 4.0 08/16/2017   CL 97 (L) 08/16/2017   CREATININE 1.21 (H) 08/16/2017   BUN 36 (H) 08/16/2017   CO2 26 08/16/2017   TSH 0.515 08/06/2014   INR 0.97 07/17/2016   HGBA1C 6.6 (H) 07/18/2016     BNP (last 3 results) Recent Labs    08/12/17 0544  BNP 46.4    ProBNP (last 3 results) No results for input(s): PROBNP in the last 8760 hours.   Other Studies Reviewed Today:  Echo 01/18/2016 LV EF: 65% - 70% Study Conclusions  - Left ventricle: The cavity size was normal. Septal wall thickness was increased in a pattern of severe LVH with mild  hypertrophy of the posterior wall. There was severe concentric hypertrophy. Systolic function was vigorous. The estimated ejection fraction was in the range of 65% to 70%. Wall motion was normal; there were no regional wall motion abnormalities. Features are consistent with a pseudonormal left ventricular filling pattern, with concomitant abnormal relaxation and increased filling pressure (grade 2 diastolic dysfunction). Doppler parameters are consistent with high ventricular filling pressure. - Aortic valve: Transvalvular velocity was within the normal range. There was no stenosis. There was no regurgitation. - Mitral valve: Transvalvular velocity was within the normal range. There was no evidence for stenosis. There was mild regurgitation. - Right ventricle: The cavity size was normal. Wall thickness was normal. Systolic function was normal. - Tricuspid valve: There was trivial regurgitation. - Pulmonary arteries: Systolic pressure was within the normal range.   Cath 01/18/2016 Conclusion     Mid RCA lesion, 70 %stenosed.  Dist RCA lesion, 90 %stenosed.  A STENT SYNERGY DES [id number] drug eluting stent was successfully placed, and overlaps the 2.5 x 38 mm Synergy stent placed distally.  Prox RCA lesion, 80 %stenosed.  Post intervention, there is a 0% residual stenosis.  Ost Cx to Prox Cx lesion, 10 %stenosed.  Mid LAD lesion, 10 %stenosed.  1. NSTEMI 2. Severe single vessel CAD with severe stenosis in the mid and distal RCA 3. Successful PTCA/DES x 2 mid and distal RCA  Recommendations: Dual anti-platelet therapy with ASA and Brilinta for one year. Continue statin.       Assessment/Plan:  1. Chest pain: She has been having chest pain for the past 3-4 days, it is not associated with exertion.  There is no EKG changes.  Troponin is negative.  Recommend treat asthma first, potentially consider outpatient stress test once asthma improved.   If she does have recurrent chest pain after asthma treatment, may consider inpatient stress test as well.  Contraindicated to have [place] stress test with his acute asthma exacerbation due to potential worsening breathing issues on Lexiscan.  2. Asthma exacerbation: Currently on BiPAP, she apparently has recurrent asthma exacerbation.  This is at least the third time in the past 6 month.  May consider switching Toprol-XL to bisoprolol.  3. CAD s/p DES x2 to mid to distal RCA in September 2017: Continue aspirin and statin  4. chronic diastolic heart failure: Appears to be  euvolemic on physical exam.  BMP normal  5. Hypertension: Blood pressure is uncontrolled.  Restart home medication, uptitrate based on blood pressure changes.  6. hyperlipidemia: On 40 mg daily of Lipitor  7. DM 2: Managed by primary service.    Current medicines are reviewed with the patient today.  The patient does not have concerns regarding medicines other than what has been noted above.  The following changes have been made:  See above.  Labs/ tests ordered today include:   No orders of the defined types were placed in this encounter.    Disposition:   FU with *** in {gen number 3-33:545625} {Days to years:10300}.   Patient is agreeable to this plan and will call if any problems develop in the interim.   SignedTruitt Merle, NP  08/27/2017 7:30 AM  [facility] [address] [place], [place]  [address] Phone: [phone] Fax: [phone]

## 2017-09-17 ENCOUNTER — Encounter: Payer: Self-pay | Admitting: Nurse Practitioner

## 2017-09-17 ENCOUNTER — Ambulatory Visit: Payer: Medicare Other | Admitting: Nurse Practitioner

## 2017-09-17 VITALS — BP 152/76 | HR 75 | Ht 62.0 in | Wt 161.8 lb

## 2017-09-17 DIAGNOSIS — I2510 Atherosclerotic heart disease of native coronary artery without angina pectoris: Secondary | ICD-10-CM

## 2017-09-17 DIAGNOSIS — I5032 Chronic diastolic (congestive) heart failure: Secondary | ICD-10-CM | POA: Diagnosis not present

## 2017-09-17 DIAGNOSIS — I10 Essential (primary) hypertension: Secondary | ICD-10-CM | POA: Diagnosis not present

## 2017-09-17 DIAGNOSIS — E7800 Pure hypercholesterolemia, unspecified: Secondary | ICD-10-CM | POA: Diagnosis not present

## 2017-09-17 MED ORDER — NITROGLYCERIN 0.4 MG SL SUBL: 0.4000 mg | SUBLINGUAL_TABLET | SUBLINGUAL | 6 refills | Status: DC | PRN

## 2017-09-17 MED ORDER — HYDRALAZINE HCL 25 MG PO TABS: 25.0000 mg | ORAL_TABLET | Freq: Two times a day (BID) | ORAL | 3 refills | Status: DC

## 2017-09-17 MED ORDER — NEBIVOLOL HCL 2.5 MG PO TABS: 2.5000 mg | ORAL_TABLET | Freq: Every day | ORAL | 3 refills | Status: DC

## 2017-09-17 MED ORDER — DILTIAZEM HCL ER COATED BEADS 240 MG PO CP24
240.0000 mg | ORAL_CAPSULE | Freq: Every day | ORAL | 3 refills | Status: DC
Start: 2017-09-17 — End: 2018-09-25

## 2017-09-17 NOTE — Progress Notes (Signed)
CARDIOLOGY OFFICE NOTE  Date:  09/17/2017    [name] Date of Birth: Apr 25, 1951 Medical Record #[id number]  PCP:  [name], MD  Cardiologist:  [facility]   Chief Complaint  Patient presents with  . Shortness of Breath    Post hospital visit - seen for Dr. Marlou Porch    History of Present Illness: [name] is a 67 y.o. female who presents today for a post hospital visit. Seen for Dr. Marlou Porch.   She has a history of asthma, CHF with normal EF, mild MR, CAD with NSTEMI s/p PTCA/DES x2 mid and distal RCA in September of 2017.   Last seen by Dr. Marlou Porch a little over a year ago.   Presented to the hospital a month ago with dyspnea - felt to be due to medication noncompliance, deconditioning and an asthma exacerbation. She was treated with steroids. She did endorse chest pain during that admission - seen by cardiology - negative troponins, was changed to Bystolic. Echo was ok. She was placed back on her medicines. Salt restriction advised. BP was uncontrolled and hydralazine was added.   Comes in today. Here alone. Doing better. Still with some cough. She is having trouble getting her medicines. She was not given any refills. She is now out of some. She was only doing her Hydralazine twice a day - says she can't remember three times a day. Admits she was getting too much salt - says she can do better. No more chest pain. Overall, she feels much better.   Past Medical History:  Diagnosis Date  . Anxiety   . Asthma   . Bronchitis   . Chronic diastolic CHF (congestive heart failure) ([place])   . Chronic diastolic CHF (congestive heart failure) ([place]) 07/17/2016  . Coronary artery disease    a. NSTEMI 12/2015 - LHC 01/18/16: s/p overlapping DESx2 to RCA, 10% ost-prox Cx,10% mLAD.  Marland Kitchen Depression   . Diabetes mellitus   . Dyslipidemia   . GERD (gastroesophageal reflux disease)   . Heart attack ([place])   . Hypertension   . Hypertensive heart disease   . NSTEMI (non-ST elevated  myocardial infarction) ([place]) 12/2015  . Pneumonia     Past Surgical History:  Procedure Laterality Date  . ABDOMINAL HYSTERECTOMY    . CARDIAC CATHETERIZATION  1994   minimal LAD dz, no other dz, EF normal  . CARDIAC CATHETERIZATION N/A 01/18/2016   Procedure: Left Heart Cath and Coronary Angiography;  Surgeon: Burnell Blanks, MD;  Location: [facility];  Service: Cardiovascular;  Laterality: N/A;  . CARDIAC CATHETERIZATION N/A 01/18/2016   Procedure: Coronary Stent Intervention;  Surgeon: Burnell Blanks, MD;  Location: [facility];  Service: Cardiovascular;  Laterality: N/A;  . COLONOSCOPY WITH PROPOFOL N/A 04/09/2014   Procedure: COLONOSCOPY WITH PROPOFOL;  Surgeon: Cleotis Nipper, MD;  [facility];  Service: Endoscopy;  Laterality: N/A;  . CORONARY STENT PLACEMENT  01/19/2016   STENT SYNERGY DES [id number] drug eluting stent was successfully placed, and overlaps the 2.5 x 38 mm Synergy stent placed distally.  . ESOPHAGOGASTRODUODENOSCOPY (EGD) WITH PROPOFOL N/A 04/09/2014   Procedure: ESOPHAGOGASTRODUODENOSCOPY (EGD) WITH PROPOFOL;  Surgeon: Cleotis Nipper, MD;  [facility];  Service: Endoscopy;  Laterality: N/A;  . HEMORRHOID SURGERY    . HERNIA REPAIR    . KNEE ARTHROSCOPY       Medications: Current Meds  Medication Sig  . albuterol (PROAIR HFA) 108 (90 Base) MCG/ACT  inhaler Inhale 2 puffs into the lungs every 4 (four) hours as needed for wheezing or shortness of breath.  Marland Kitchen albuterol (PROVENTIL) (2.5 MG/3ML) 0.083% nebulizer solution Take 3 mLs (2.5 mg total) by nebulization every 4 (four) hours as needed for shortness of breath.  . ALPRAZolam (XANAX) 0.5 MG tablet Take 1 tablet (0.5 mg total) by mouth 2 (two) times daily as needed for anxiety.  Marland Kitchen aspirin EC 81 MG tablet Take 81 mg by mouth daily.   Marland Kitchen atorvastatin (LIPITOR) 40 MG tablet Take 40 mg by mouth daily.  . Biotin 5000 MCG CAPS Take 1 capsule by mouth daily.  .  divalproex (DEPAKOTE ER) 500 MG 24 hr tablet Take 2 tablets (1,000 mg total) by mouth at bedtime.  . DULERA 200-5 MCG/ACT AERO   . esomeprazole (NEXIUM) 40 MG capsule Take 1 capsule (40 mg total) by mouth 2 (two) times daily before a meal. Take twice a day for 4days then resume once daily (Patient taking differently: Take 40 mg by mouth daily at 12 noon. Take twice a day for 4days then resume once daily)  . estradiol (ESTRACE) 1 MG tablet Take 1 mg by mouth daily.   . fluticasone (FLONASE) 50 MCG/ACT nasal spray Place 2 sprays into both nostrils daily as needed for allergies or rhinitis.  . hydrALAZINE (APRESOLINE) 25 MG tablet Take 1 tablet (25 mg total) by mouth 2 (two) times daily.  . irbesartan (AVAPRO) 300 MG tablet Take 300 mg by mouth daily.   . metFORMIN (GLUCOPHAGE) 500 MG tablet Take 2 tablets (1,000 mg total) by mouth 2 (two) times daily with a meal.  . nebivolol (BYSTOLIC) 2.5 MG tablet Take 1 tablet (2.5 mg total) by mouth daily.  . nitroGLYCERIN (NITROSTAT) 0.4 MG SL tablet Place 1 tablet (0.4 mg total) under the tongue every 5 (five) minutes as needed for chest pain. x3 as needed  . potassium chloride SA (K-DUR,KLOR-CON) 20 MEQ tablet Take 20 mEq by mouth daily.   . ranitidine (ZANTAC) 150 MG capsule Take 150 mg by mouth every evening.  . torsemide (DEMADEX) 20 MG tablet 20 mg. 1-2 daily for swelling  . [DISCONTINUED] hydrALAZINE (APRESOLINE) 25 MG tablet Take 1 tablet (25 mg total) by mouth 3 (three) times daily. (Patient taking differently: Take 25 mg by mouth 2 (two) times daily. )  . [DISCONTINUED] nebivolol (BYSTOLIC) 2.5 MG tablet Take 1 tablet (2.5 mg total) by mouth daily.  . [DISCONTINUED] nitroGLYCERIN (NITROSTAT) 0.4 MG SL tablet Place 0.4 mg under the tongue every 5 (five) minutes as needed for chest pain. x3 as needed     Allergies: Allergies  Allergen Reactions  . Amlodipine Besylate Other (See Comments)    Reaction unknown  . Lisinopril Other (See Comments)     Reaction unknown  . Pantoprazole Sodium Other (See Comments)    Reaction unknown    Social History: The patient  reports that she quit smoking about 13 years ago. Her smoking use included cigarettes. She has a 30.00 pack-year smoking history. She has never used smokeless tobacco. She reports that she does not drink alcohol or use drugs.   Family History: The patient's family history includes Allergies in her mother; Asthma in her mother; CAD (age of onset: 21) in her father; CAD (age of onset: 10) in her sister; Congestive Heart Failure in her sister; Diabetes in her father; Hypertension in her father.   Review of Systems: Please see the history of present illness.   Otherwise, the review  of systems is positive for none.   All other systems are reviewed and negative.   Physical Exam: VS:  BP (!) 152/76 (BP Location: Left Arm, Patient Position: Sitting, Cuff Size: Normal)   Pulse 75   Ht 5\' 2"  (1.575 m)   Wt 161 lb 12.8 oz (73.4 kg)   LMP  (LMP Unknown)   SpO2 100% Comment: at rest  BMI 29.59 kg/m  .  BMI Body mass index is 29.59 kg/m.  Wt Readings from Last 3 Encounters:  09/17/17 161 lb 12.8 oz (73.4 kg)  08/17/17 157 lb 1.6 oz (71.3 kg)  05/17/17 151 lb 3.2 oz (68.6 kg)   BP recheck by me is down to 138/72.   General: Pleasant. Well developed, well nourished and in no acute distress.   HEENT: Normal.  Neck: Supple, no JVD, carotid bruits, or masses noted.  Cardiac: Regular rate and rhythm. No murmurs, rubs, or gallops. No edema.  Respiratory:  Lungs with decreased breath sounds.  MS: No deformity or atrophy. Gait and ROM intact.  Skin: Warm and dry. Color is normal.  Neuro:  Strength and sensation are intact and no gross focal deficits noted.  Psych: Alert, appropriate and with normal affect.   LABORATORY DATA:  EKG:  EKG is not ordered today.  Lab Results  Component Value Date   WBC 14.1 (H) 08/16/2017   HGB 11.7 (L) 08/16/2017   HCT 35.7 (L) 08/16/2017   PLT  359 08/16/2017   GLUCOSE 227 (H) 08/16/2017   CHOL 196 07/18/2016   TRIG 203 (H) 07/18/2016   HDL 48 07/18/2016   LDLCALC 107 (H) 07/18/2016   ALT 15 08/13/2017   AST 21 08/13/2017   NA 133 (L) 08/16/2017   K 4.0 08/16/2017   CL 97 (L) 08/16/2017   CREATININE 1.21 (H) 08/16/2017   BUN 36 (H) 08/16/2017   CO2 26 08/16/2017   TSH 0.515 08/06/2014   INR 0.97 07/17/2016   HGBA1C 6.6 (H) 07/18/2016     BNP (last 3 results) Recent Labs    08/12/17 0544  BNP 46.4    ProBNP (last 3 results) No results for input(s): PROBNP in the last 8760 hours.   Other Studies Reviewed Today:  Echo Study Conclusions 07/2017  - Left ventricle: The cavity size was normal. There was moderate   concentric hypertrophy. Systolic function was vigorous. The   estimated ejection fraction was in the range of 65% to 70%. Wall   motion was normal; there were no regional wall motion   abnormalities. Features are consistent with a pseudonormal left   ventricular filling pattern, with concomitant abnormal relaxation   and increased filling pressure (grade 2 diastolic dysfunction).   Doppler parameters are consistent with high ventricular filling   pressure. - Mitral valve: There was trivial regurgitation. - Pulmonary arteries: PA peak pressure: 34 mm Hg (S).  Assessment/Plan:  1. Chest pain - resolved.   2. Shortness of breath/Asthma exacerbation - improved.   3. HTN - changed to Bystolic. Hydralazine added. Recheck of her BP by me looks better. No changes made today. Medicines have been refilled.   4. CAD with prior DES x 2 to mid to distal RCA in 2017. No chest pain. Needs CV risk factor modification.   5. Chronic diastolic HF - needs salt restriction, BP control, etc.   Current medicines are reviewed with the patient today.  The patient does not have concerns regarding medicines other than what has been noted above.  The  following changes have been made:  See above.  Labs/ tests ordered  today include:    Orders Placed This Encounter  Procedures  . Basic metabolic panel  . CBC     Disposition:   FU with Dr. Marlou Porch in 4 months.   Patient is agreeable to this plan and will call if any problems develop in the interim.   SignedTruitt Merle, NP  09/17/2017 3:47 PM  [facility] [address] [place], [place]  [address] Phone: [phone] Fax: [phone]

## 2017-09-17 NOTE — Patient Instructions (Addendum)
We will be checking the following labs today - BMET & CBC   Medication Instructions:    Continue with your current medicines.   I sent in your refills for the Bystolic, Diltiazem, NTG and hydralazine.     Testing/Procedures To Be Arranged:  N/A  Follow-Up:   See Dr. Marlou Porch in 4 months    Other Special Instructions:   N/A    If you need a refill on your cardiac medications before your next appointment, please call your pharmacy.   Call the [place] office at [phone] if you have any questions, problems or concerns.

## 2017-09-18 ENCOUNTER — Ambulatory Visit (INDEPENDENT_AMBULATORY_CARE_PROVIDER_SITE_OTHER): Payer: Medicare Other | Admitting: Internal Medicine

## 2017-09-18 ENCOUNTER — Encounter: Payer: Self-pay | Admitting: Internal Medicine

## 2017-09-18 VITALS — BP 130/72 | HR 71 | Ht 62.0 in | Wt 160.0 lb

## 2017-09-18 DIAGNOSIS — J4530 Mild persistent asthma, uncomplicated: Secondary | ICD-10-CM

## 2017-09-18 DIAGNOSIS — R05 Cough: Secondary | ICD-10-CM

## 2017-09-18 DIAGNOSIS — R058 Other specified cough: Secondary | ICD-10-CM

## 2017-09-18 LAB — BASIC METABOLIC PANEL
BUN/Creatinine Ratio: 25 (ref 12–28)
BUN: 28 mg/dL — ABNORMAL HIGH (ref 8–27)
CO2: 25 mmol/L (ref 20–29)
Calcium: 9.5 mg/dL (ref 8.7–10.3)
Chloride: 98 mmol/L (ref 96–106)
Creatinine, Ser: 1.14 mg/dL — ABNORMAL HIGH (ref 0.57–1.00)
GFR calc Af Amer: 57 mL/min/{1.73_m2} — ABNORMAL LOW (ref >59)
GFR calc non Af Amer: 50 mL/min/{1.73_m2} — ABNORMAL LOW (ref >59)
Glucose: 101 mg/dL — ABNORMAL HIGH (ref 65–99)
Potassium: 4.1 mmol/L (ref 3.5–5.2)
Sodium: 142 mmol/L (ref 134–144)

## 2017-09-18 LAB — CBC
Hematocrit: 34.2 % (ref 34.0–46.6)
Hemoglobin: 11.2 g/dL (ref 11.1–15.9)
MCH: 31.1 pg (ref 26.6–33.0)
MCHC: 32.7 g/dL (ref 31.5–35.7)
MCV: 95 fL (ref 79–97)
Platelets: 338 10*3/uL (ref 150–450)
RBC: 3.6 x10E6/uL — ABNORMAL LOW (ref 3.77–5.28)
RDW: 14.4 % (ref 12.3–15.4)
WBC: 8.9 10*3/uL (ref 3.4–10.8)

## 2017-09-18 LAB — NITRIC OXIDE: NITRIC OXIDE: 20

## 2017-09-18 MED ORDER — BUDESONIDE-FORMOTEROL FUMARATE 80-4.5 MCG/ACT IN AERO: 2.0000 | INHALATION_SPRAY | Freq: Two times a day (BID) | RESPIRATORY_TRACT | 11 refills | Status: DC

## 2017-09-18 MED ORDER — PREDNISONE 10 MG PO TABS: ORAL_TABLET | ORAL | 0 refills | Status: DC

## 2017-09-18 NOTE — Patient Instructions (Addendum)
For drainage / throat tickle try take CHLORPHENIRAMINE  4 mg - take one every 4 hours as needed - available over the counter- may cause drowsiness so start with just a one hour before  bedtime dose or two and see how you tolerate it before trying in daytime    Continue zantac one hour before bedtime   Prednisone 10 mg take  4 each am x 2 days,   2 each am x 2 days,  1 each am x 2 days and stop   GERD (REFLUX)  is an extremely common cause of respiratory symptoms just like yours , many times with no obvious heartburn at all.    It can be treated with medication, but also with lifestyle changes including elevation of the head of your bed (ideally with 6 inch  bed blocks),  Smoking cessation, avoidance of late meals, excessive alcohol, and avoid fatty foods, chocolate, peppermint, colas, red wine, and acidic juices such as orange juice.  NO MINT OR MENTHOL PRODUCTS SO NO COUGH DROPS   USE SUGARLESS CANDY INSTEAD (Jolley ranchers or Stover's or Life Savers) or even ice chips will also do - the key is to swallow to prevent all throat clearing. NO OIL BASED VITAMINS - use powdered substitutes.   Plan A = Automatic = symbicort 80  Take 2 puffs first thing in am and then another 2 puffs about 12 hours later.   Work on inhaler technique:  relax and gently blow all the way out then take a nice smooth deep breath back in, triggering the inhaler at same time you start breathing in.  Hold for up to 5 seconds if you can. Blow out thru nose. Rinse and gargle with water when done      Plan B = Backup Only use your albuterol as a rescue medication to be used if you can't catch your breath by resting or doing a relaxed purse lip breathing pattern.  - The less you use it, the better it will work when you need it. - Ok to use the inhaler up to 2 puffs  every 4 hours if you must but call for appointment if use goes up over your usual need - Don't leave home without it !!  (think of it like the spare tire for your  car)   Plan C = Crisis - only use your albuterol nebulizer if you first try Plan B and it fails to help > ok to use the nebulizer up to every 4 hours but if start needing it regularly call for immediate appointment   See [name] NP w/in 4  weeks with all your medications, even over the counter meds, separated in two separate bags, the ones you take no matter what vs the ones you stop once you feel better and take only as needed when you feel you need them.   [name]  will generate for you a new user friendly medication calendar that will put [place] all on the same page re: your medication use.

## 2017-09-18 NOTE — Progress Notes (Signed)
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 Asthma     Brief history of present illness from the day of admission and additional interim summary    [name] a 67 y.o.femalewith medical history significant for hypertension, diabetes on metformin, history of prior PE and asthma. This past Thursday patient began having upper respiratory infection symptoms with cough and congestion with productive yellow sputum. Because of this she developed asthma exacerbation symptoms which are typical for her with increased wheezing and tightness. She is not had fevers or chills. She's not had any GI symptoms such as nausea, vomiting or diarrhea. She went to her PCP who prescribed prednisone. In addition she utilized her normal nebulizers and MDIs without improvement in her symptoms. Because of increased respiratory distress she presented to the ER today. No documentation from ER to determine if patient was hypoxemic at presentation. She was wheezing significantly upon arrival and becoming dyspneic with talking. She was also reporting pleuritic chest tightness with taking a deep breath.   She has been exposed to multiple sick contacts with similar symptoms prior to onset of her symptoms.She was positive for coronavirus which likely  exacerbated her asthma, also had NSTEMI requiring left heart cath and stent placement.   Hospital issues addressed     1. Acute on chronic asthma exacerbation. She still wheezing, continue IV steroids, added Singulair, added nebulizer treatments both scheduled and as needed, will add Dulera as well and monitor. Respiratory panel positive for coronavirus likely URI exacerbated her asthma + dCHF, SOB better, she has no oxygen requirement for the last 2 days, she still has minimal wheezing but this is mostly upper airway coming from her throat, will be placed on steroid taper, continue home nebulizer treatments and follow with Dr. Melvyn Novas her pulmonary physician in a week.  2. Hypertension. Hydralazine dose doubled, ARB + Cardizem. Follow with PCP for blood pressure control.  3. History of PE. Normal d-dimer.  4. Hypokalemia. Replaced and stable.  5. GERD. On PPI.  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 Continue statin & Cardizem, no B.Blocker due to Asthma, pain free. All with cardiology outpatient.  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  8. DM2 - continue Home rx.   Discharge diagnosis     Principal Problem:   Acute bronchitis with asthma with acute exacerbation Active Problems:   Diabetes mellitus type 2, uncontrolled ([place])   Essential  hypertension   G E R D   PULMONARY EMBOLISM, HX OF   Hyperlipidemia   Chest pain   Acute hypokalemia   Hypomagnesemia   Acute respiratory failure (HCC)   NSTEMI (non-ST elevated myocardial infarction) (HCC)   CAD in native artery   Hypertensive heart disease    01/24/2016  Extended post hosp f/u ov/ transition of care/ /Wert re: dtca asthma/ vcd symb 160 and pred 5  x 10 mg daily  Chief Complaint  Patient presents with  . Follow-up    Pt. recently [facility] for an asthma attack, Pt. states her breathing remains the same, coughing with some yellow to white mucus,Has been needing to use Ventolin more often  last doing well 2 months prior to OV   While on Symb 160/ gerd rx but not dosing ac as rec  Waking up prematurely x 2 months with cough/wheeze/ sob but never contacted me as per the instructions rec Plan A = Automatic = dulera 100 Take 2 puffs first thing in am and then another 2 puffs about 12 hours later.                                      Nexum 40 mg Take 30-60 min before first meal of the day and Pepcid AC 20 mg  at bedtime Work on inhaler technique  Plan B = Backup Only use your albuterol as a rescue medication Plan C = Crisis - only use your albuterol nebulizer if you first try Plan B and it fails to help > ok to use the nebulizer up to every 4 hours but if start needing it regularly call for immediate appointment GERD diet  Take delsym two tsp every 12 hours and use the flutter valve as much as possible and supplement if needed with  tramadol 50 mg up to 2 every 4 hours to suppress the urge to cough  Once you have eliminated the cough for 3 straight days try reducing the tramadol first,  then the delsym as tolerated.   Prednisone 5 mg x 8, then 6, then 4 then 2, then 1 daily x 2 days and off  please schedule a follow up office visit in 2 weeks, sooner if needed with all meds/ inhalers/ neb solutions in hand     02/23/2016  f/u ov/Wert re: dtca vs vcd/ did not bring meds as req Chief Complaint  Patient presents with  . Follow-up    Cough had stopped and then resumed again 1 day ago. She states she woke up coughing at 2 am- non prod and also having chest tightness.   husband says throat clearing never stops x years  Some better on pred but flared w/in a few days of stopping it   rec Plan A = Automatic = dulera 100 Take 2 puffs  first thing in am and then another 2 puffs about 12 hours later.                                      Nexum 40 mg Take 30-60 min before first meal of the day and Pepcid AC 20 mg  at bedtime  Add neurontin (gabapentin) 100 mg three times daily  Work on perfecting  inhaler technique: Plan B = Backup Only use your albuterol as a rescue medication Plan C = Crisis - only use your albuterol nebulizer Take delsym two tsp every 12 hours and use the flutter valve as much as possible and supplement if needed with  Tylenol #3   up to 1-2 every 4 hours to suppress the urge to cough  Once you have eliminated the cough for 3 straight days try reducing the tylenol #3  first,  then the delsym as tolerated.   Prednisone 10 mg take  4 each am x 2 days,   2 each am x 2 days,  1 each am x 2 days and stop  Please remember to go to the lab  department downstairs for your tests - we will call you with the results when they are available. Please schedule a follow up office visit in 2 weeks, sooner if needed with all meds/ inhalers/ neb solutions in hand to see [name] NP for med calendar Late add : given dulera 100 x one sample = 60 pffs - check count on return   02/27/16 admit > no better at all          04/07/2016  exteneded f/u ov/Wert re:  Cough/wheezing and sob since July 2017 with nl spirometry 04/07/16   Chief Complaint  Patient presents with  . Follow-up    Coughing and wheezing "all day"- cough is occ prod with yellow sputum and she has also had yellow nasal d/c. Her breathing is "so so". She is using her albuterol inhaler every 4 hours "I always need it".  She had been using neb 2 x daily, but has not used for the past several days.   no meds/ never got calendar/ not clear at all what she's taking other than way too much saba rec Stop dulera for now and continue symbicort 80 2bid  Work on inhaler technique:    For cough > cough into the flutter valve and increase the  neurontin to 300 mg three times daily  Augmentin 875 mg take one pill twice daily  X 10 days - take at breakfast and supper with large glass of water.  It would help reduce the usual side effects (diarrhea and yeast infections) if you ate cultured yogurt at lunch.  Plan A = Automatic = Symbicort 80 Take 2 puffs first thing in am and then another 2 puffs about 12 hours later.  Plan B = Backup Only use your albuterol (Proair) as a rescue medication to be used if you can't catch your breath by resting or doing a relaxed purse lip breathing pattern.  - The less you use it, the better it will work when you need it. - Ok to use the inhaler up to 2 puffs  every 4 hours if you must but call for appointment if use goes up over your usual need - Don't leave home without it !!  (think of it like the spare tire for your car)  Plan C = Crisis - only use your albuterol nebulizer if you first try Plan B and it fails to help > ok to use the nebulizer up to every 4 hours but if start needing it regularly call for immediate appointment       08/01/16 NP med calendar: FENO 10 on symb 160 but still wheeze and sob/ no cough rec May stop Gabapentin .  Add Spiriva 2  puff  daily .  Continue Dulera 2 puffs Twice daily  , rinse after use.  Follow med calendar closely and bring to each visit      09/18/2016  f/u ov/Wert re:  UACS vs Asthma brought med calendar but not updated / on symb 160 / max gerd rx  Chief Complaint  Patient presents with  . Follow-up    Pt c/o occ wheezing. She has not used rescue inhaler or neb recently.   still feels wheezing  And doe = MMRC1 = can walk nl pace, flat grade, can't hurry or go uphills or steps s sob rec Change the symbicort to 80 Take 2 puffs first thing in am and then another 2 puffs about 12 hours later.  Work on inhaler technique:  Use the med calendar daily to organize your medications as a "checklist" See [name] NP in 4  weeks with all your medications> never  happened   After    Ov 09/18/16  "breathing was fine"  Cough was gone for months and months but around late Feb 2019 got worse went to er, rx prednisone did not contact this office then admitted :   Admit date: 08/12/2017 Discharge date: 08/17/2017      Discharge Diagnosis:   Principal Problem:   Dyspnea Active Problems:   Diabetes mellitus type 2, uncontrolled ([place])   Essential hypertension   Asthma exacerbation   Hypokalemia   Anemia       History of Present Illness:   [name] a66 y.o.female,w Asthma , CHF (EF 65-70%) Mild MR, CAD s/p NSTEMI S/p PTCA/DES x2 mid and distal RCA 01/18/2016 apparently c/o dyspnea starting about 3 days ago. Slight dry cough and wheezing. Slight left sided chest pain " sharp" without radiation , under the left breast for the past 1 day, "constant". Nothing appeared to make better or worse. Pt notes slight lower ext edema but denies orthopnea, pnd. Pt presented due to dyspnea. Weight stable.      Hospital Course by Problem:   Dyspnea due to asthma exacerbation+ suspected deconditioning -weaned solumedrol to PO prednisone with taper -patient not good with taking scheduled inhalers at home-- encourage compliance Symbicort 2puff bid=>Dulera  Albuterol nebs PRN -home O2 eval-- showed no de-saturations  Chest pain -cardiology consult appreciated -BB changed to bystolic -outpatient follow up  Chronic diastolic CHF (09-38%) Torsemide 20mg  po qday Cont Irbesartan 300mg  po qday metoprolol XL 25mg  changed to bistolic     1/82/9937 extended post hosp  f/u ov/Wert re: asthma vs uacs/ vcd  Chief Complaint  Patient presents with  . Follow-up    She states admitted to hospital 08/12/17-08/17/17 with asthma flare- breathing not back to her normal baseline yet. She is coughing up minimal yellow sputum over the past 2 days. She is using her albuterol inhaler once daily on average and has not had to use her neb.    Dyspnea: MMRC1 = can walk nl pace, flat grade, can't hurry or go uphills or steps s sob   Cough: mostly dry raspy with throat clearing day > noct  Sleep props up 2 pillows due to obvious gerd    No obvious day to day or daytime variability or assoc excess/ purulent sputum or mucus plugs or hemoptysis or cp or chest tightness, subjective wheeze or overt sinus   symptoms. No unusual exposure hx or h/o childhood pna/ asthma or knowledge of premature birth.  Sleeping  2 pillows without nocturnal  or early am exacerbation  of respiratory  c/o's or need for noct saba. Also denies any obvious fluctuation of symptoms with weather or environmental changes or other aggravating or alleviating factors except as outlined above   Current Allergies, Complete Past Medical History, Past Surgical History, Family History, and Social History were reviewed in [employer] record.  ROS  The following are not active complaints unless bolded Hoarseness, sore throat, dysphagia, dental problems, itching, sneezing,  nasal congestion or discharge of excess mucus or purulent secretions, ear ache,   fever, chills, sweats, unintended wt loss or wt gain, classically pleuritic or exertional cp,  orthopnea pnd or arm/hand swelling  or leg swelling, presyncope, palpitations, abdominal pain, anorexia, nausea, vomiting, diarrhea  or change in bowel habits or change in bladder habits, change in stools or change in urine, dysuria, hematuria,  rash, arthralgias, visual complaints, headache, numbness, weakness or ataxia or problems with walking or coordination,  change in mood or  memory.        Current Meds  Medication Sig  . albuterol (PROAIR HFA) 108 (90 Base) MCG/ACT inhaler Inhale 2 puffs into the lungs every 4 (four) hours as needed for wheezing or shortness of breath.  Marland Kitchen albuterol (PROVENTIL) (2.5 MG/3ML) 0.083% nebulizer solution Take 3 mLs (2.5 mg total) by nebulization every 4 (four) hours as needed for  shortness of breath.  . ALPRAZolam (XANAX) 0.5 MG tablet Take 1 tablet (0.5 mg total) by mouth 2 (two) times daily as needed for anxiety.  Marland Kitchen aspirin EC 81 MG tablet Take 81 mg by mouth daily.   Marland Kitchen atorvastatin (LIPITOR) 40 MG tablet Take 40 mg by mouth daily.  . Biotin 5000 MCG CAPS Take 1 capsule by mouth daily.  Marland Kitchen diltiazem (CARDIZEM CD) 240 MG 24 hr capsule Take 1 capsule (240 mg total) by mouth daily.  . divalproex (DEPAKOTE ER) 500 MG 24 hr tablet Take 2 tablets (1,000 mg total) by mouth at bedtime.  . DULERA 200-5 MCG/ACT AERO   . esomeprazole (NEXIUM) 40 MG capsule Take 1 capsule (40 mg total) by mouth 2 (two) times daily before a meal. Take twice a day for 4days then resume once daily (Patient taking differently: Take 40 mg by mouth daily at 12 noon. Take twice a day for 4days then resume once daily)  . estradiol (ESTRACE) 1 MG tablet Take 1 mg by mouth daily.   . hydrALAZINE (APRESOLINE) 25 MG tablet Take 1 tablet (25 mg total) by mouth 2 (two) times daily.  . irbesartan (AVAPRO) 300 MG tablet Take 300 mg by mouth daily.   Marland Kitchen loratadine (CLARITIN) 10 MG tablet Take 1 tablet (10 mg total) by mouth daily.  . metFORMIN (GLUCOPHAGE) 500 MG tablet Take 2 tablets (1,000 mg total) by mouth 2 (two) times daily with a meal.  . nebivolol (BYSTOLIC) 5 MG tablet Take 5 mg by mouth daily.  . nitroGLYCERIN (NITROSTAT) 0.4 MG SL tablet Place 1 tablet (0.4 mg total) under the tongue every 5 (five) minutes as needed for chest pain. x3 as needed  . potassium chloride SA (K-DUR,KLOR-CON) 20 MEQ tablet Take 20 mEq by mouth daily.   . ranitidine (ZANTAC) 150 MG capsule Take 150 mg by mouth every evening.  . torsemide (DEMADEX) 20 MG tablet 20 mg. 1-2 daily for swelling               Objective:   Physical Exam  amb bf with vigorous  throat clearing / classic pseudowheeze   02/23/2016   168 >  04/07/2016  168 > 05/08/2016    168 >  07/17/2016  162 > 09/18/2016    164  > 09/18/2017  160     Vital signs  reviewed - Note on arrival 02 sats  98% on RA      09/18/2017   HEENT: nl dentition, turbinates bilaterally, and oropharynx. Nl external ear canals without cough reflex   NECK :  without JVD/Nodes/TM/ nl carotid upstrokes bilaterally   LUNGS: no acc muscle use,  Nl contour chest with lots of transmitted pseudowheeze but o/w   clear to A and P bilaterally without cough on insp or exp maneuvers   CV:  RRR  no s3 or murmur or increase in P2, and no edema   ABD:  soft and nontender with nl inspiratory excursion in the supine position. No bruits or organomegaly appreciated, bowel sounds nl  MS:  Nl gait/ ext warm without deformities, calf tenderness, cyanosis or clubbing No obvious joint restrictions   SKIN: warm and dry without lesions    NEURO:  alert, approp, nl sensorium with  no motor or cerebellar deficits apparent.          I personally reviewed images and agree with radiology impression as follows:  pCXR:   08/12/17 Mild cardiomegaly and bibasilar atelectasis.

## 2017-09-19 ENCOUNTER — Encounter: Payer: Self-pay | Admitting: Internal Medicine

## 2017-09-19 NOTE — Assessment & Plan Note (Addendum)
- FEN0 01/24/2016 = 13 with active symptoms on symb 160 2bid > changed to dulera 100  - Spirometry 02/23/2016  NO signifiant obstruction with active symptoms  - 02/23/2016  After extensive coaching HFA effectiveness =    75%  - FENO 02/23/2016  =  12   On dulera 100 2bid (unable to confirm adeherence as did not bring it - Singulair 10 mg daily added 02/23/2016 to dulera 100 2bid with 2 week sample only  (unable to verify she used it) - Allergy profile 02/23/16  >  Eos 0.0 /  IgE  87 pos  RAST  Grass/ trees  - 04/07/2016   symb 80 x one sample given - FENO 04/07/2016  =   43 ? Really on meds ?  - 05/08/2016  After extensive coaching HFA effectiveness =    75% > continue symb 80 2bid - 07/17/2016 added singulair maint  - FENO 09/18/2016  =   15 on symb 160 2bid > try 80 2bid   - flare late feb 2019 ? What maint rx she was taking but on admit "non-adherent per notes 08/12/17  - 09/18/2017  After extensive coaching inhaler device  effectiveness =    75% from a baseline of 50% > restart symb 80 2bid - FENO 09/18/2017  =   20 on symb 160 - Spirometry 09/18/2017  No obstruction at all with active wheeze    DDX of  difficult airways management almost all start with A and  include Adherence, Ace Inhibitors, Acid Reflux, Active Sinus Disease, Alpha 1 Antitripsin deficiency, Anxiety masquerading as Airways dz,  ABPA,  Allergy(esp in young), Aspiration (esp in elderly), Adverse effects of meds,  Active smokers, A bunch of PE's (a small clot burden can't cause this syndrome unless there is already severe underlying pulm or vascular dz with poor reserve) plus two Bs  = Bronchiectasis and Beta blocker use..and one C= CHF    Adherence is always the initial "prime suspect" and is a multilayered concern that requires a "trust but verify" approach in every patient - starting with knowing how to use medications, especially inhalers, correctly, keeping up with refills and understanding the fundamental difference between  maintenance and prns vs those medications only taken for a very short course and then stopped and not refilled.  -see hfa teaching  - return for medication reconciliation.  To keep things simple, I have asked the patient to first separate medicines that are perceived as maintenance, that is to be taken daily "no matter what", from those medicines that are taken on only on an as-needed basis and I have given the patient examples of both, and then return to see our NP to generate a  detailed  medication calendar which should be followed until the next physician sees the patient and updates it.    ? Acid (or non-acid) GERD > always difficult to exclude as up to 75% of pts in some series report no assoc GI/ Heartburn symptoms> rec max (24h)  acid suppression and diet restrictions/ reviewed and instructions given in writing.   ? Anxiety/vcd strongly suggested by spirometry   > usually at the bottom of this list of usual suspects but should be much higher on this pt's based on H and P and note already on psychotropics and may interfere with adherence and also interpretation of response or lack thereof to symptom management which can be quite subjective.   ? Allergy > less likely with feno so low /  see allergy profile above/ reviewed with pt/ continue singulair/ add acutely Prednisone 10 mg take  4 each am x 2 days,   2 each am x 2 days,  1 each am x 2 days and stop  ? BB effects > very unlikely on Bystolic, the most beta selective beta blocker on the market.  ? chf > very unlikely the BNP so low during her acute admission   I had an extended discussion with the patient reviewing all relevant studies completed to date and  lasting 25 minutes of a 40  minute extended post hops office visit/ transition of care and re-establish     re  severe non-specific but potentially very serious refractory respiratory symptoms of uncertain and potentially multiple  etiologies.  See device teaching which extended face to  face time for this visit  Each maintenance medication was reviewed in detail including most importantly the difference between maintenance and prns and under what circumstances the prns are to be triggered using an action plan format that is not reflected in the computer generated alphabetically organized AVS.    Please see AVS for specific instructions unique to this office visit that I personally wrote and verbalized to the the pt in detail and then reviewed with pt  by my nurse highlighting any changes in therapy/plan of care  recommended at today's visit.

## 2017-09-19 NOTE — Assessment & Plan Note (Addendum)
-   CT sinus 08/06/14 > neg  - Allergy profile 02/23/16  >  Eos 0.0 /  IgE  87 pos  RAST  Grass/ trees  - improved 05/08/2016 on gabapentin 300 tid > no change rx > d/c'd 07/2016   If not improved on follow-up I would strongly recommend that she be placed back on gabapentin which seems to have helped the problem previously rather than repeat the workup.

## 2017-10-05 ENCOUNTER — Telehealth: Payer: Self-pay | Admitting: Internal Medicine

## 2017-10-05 ENCOUNTER — Inpatient Hospital Stay (HOSPITAL_COMMUNITY)
Admission: EM | Admit: 2017-10-05 | Discharge: 2017-10-18 | DRG: 202 | Disposition: A | Discharge status: Home / Self Care | Payer: Medicare Other | Attending: Internal Medicine | Admitting: Internal Medicine

## 2017-10-05 ENCOUNTER — Other Ambulatory Visit: Payer: Self-pay

## 2017-10-05 ENCOUNTER — Emergency Department (HOSPITAL_COMMUNITY): Payer: Medicare Other

## 2017-10-05 ENCOUNTER — Encounter (HOSPITAL_COMMUNITY): Payer: Self-pay

## 2017-10-05 DIAGNOSIS — J45901 Unspecified asthma with (acute) exacerbation: Secondary | ICD-10-CM | POA: Diagnosis present

## 2017-10-05 DIAGNOSIS — Z7984 Long term (current) use of oral hypoglycemic drugs: Secondary | ICD-10-CM

## 2017-10-05 DIAGNOSIS — R0789 Other chest pain: Secondary | ICD-10-CM | POA: Diagnosis present

## 2017-10-05 DIAGNOSIS — J4541 Moderate persistent asthma with (acute) exacerbation: Secondary | ICD-10-CM

## 2017-10-05 DIAGNOSIS — D509 Iron deficiency anemia, unspecified: Secondary | ICD-10-CM | POA: Diagnosis present

## 2017-10-05 DIAGNOSIS — Z888 Allergy status to other drugs, medicaments and biological substances status: Secondary | ICD-10-CM

## 2017-10-05 DIAGNOSIS — E1142 Type 2 diabetes mellitus with diabetic polyneuropathy: Secondary | ICD-10-CM | POA: Diagnosis present

## 2017-10-05 DIAGNOSIS — Z955 Presence of coronary angioplasty implant and graft: Secondary | ICD-10-CM

## 2017-10-05 DIAGNOSIS — E119 Type 2 diabetes mellitus without complications: Secondary | ICD-10-CM

## 2017-10-05 DIAGNOSIS — J209 Acute bronchitis, unspecified: Secondary | ICD-10-CM | POA: Diagnosis present

## 2017-10-05 DIAGNOSIS — I2510 Atherosclerotic heart disease of native coronary artery without angina pectoris: Secondary | ICD-10-CM | POA: Diagnosis present

## 2017-10-05 DIAGNOSIS — Z66 Do not resuscitate: Secondary | ICD-10-CM | POA: Diagnosis present

## 2017-10-05 DIAGNOSIS — I252 Old myocardial infarction: Secondary | ICD-10-CM

## 2017-10-05 DIAGNOSIS — G40909 Epilepsy, unspecified, not intractable, without status epilepticus: Secondary | ICD-10-CM | POA: Diagnosis present

## 2017-10-05 DIAGNOSIS — I110 Hypertensive heart disease with heart failure: Secondary | ICD-10-CM | POA: Diagnosis present

## 2017-10-05 DIAGNOSIS — K222 Esophageal obstruction: Secondary | ICD-10-CM

## 2017-10-05 DIAGNOSIS — J4551 Severe persistent asthma with (acute) exacerbation: Principal | ICD-10-CM | POA: Diagnosis present

## 2017-10-05 DIAGNOSIS — J9601 Acute respiratory failure with hypoxia: Secondary | ICD-10-CM | POA: Diagnosis present

## 2017-10-05 DIAGNOSIS — Z87891 Personal history of nicotine dependence: Secondary | ICD-10-CM

## 2017-10-05 DIAGNOSIS — J449 Chronic obstructive pulmonary disease, unspecified: Secondary | ICD-10-CM | POA: Diagnosis present

## 2017-10-05 DIAGNOSIS — N179 Acute kidney failure, unspecified: Secondary | ICD-10-CM | POA: Diagnosis present

## 2017-10-05 DIAGNOSIS — Z79899 Other long term (current) drug therapy: Secondary | ICD-10-CM

## 2017-10-05 DIAGNOSIS — D539 Nutritional anemia, unspecified: Secondary | ICD-10-CM | POA: Diagnosis present

## 2017-10-05 DIAGNOSIS — F418 Other specified anxiety disorders: Secondary | ICD-10-CM | POA: Diagnosis present

## 2017-10-05 DIAGNOSIS — R079 Chest pain, unspecified: Secondary | ICD-10-CM | POA: Diagnosis present

## 2017-10-05 DIAGNOSIS — Z86718 Personal history of other venous thrombosis and embolism: Secondary | ICD-10-CM

## 2017-10-05 DIAGNOSIS — R0602 Shortness of breath: Secondary | ICD-10-CM

## 2017-10-05 DIAGNOSIS — E785 Hyperlipidemia, unspecified: Secondary | ICD-10-CM | POA: Diagnosis present

## 2017-10-05 DIAGNOSIS — I5033 Acute on chronic diastolic (congestive) heart failure: Secondary | ICD-10-CM | POA: Diagnosis present

## 2017-10-05 DIAGNOSIS — Z86711 Personal history of pulmonary embolism: Secondary | ICD-10-CM

## 2017-10-05 DIAGNOSIS — I5032 Chronic diastolic (congestive) heart failure: Secondary | ICD-10-CM | POA: Diagnosis present

## 2017-10-05 DIAGNOSIS — T380X5A Adverse effect of glucocorticoids and synthetic analogues, initial encounter: Secondary | ICD-10-CM | POA: Diagnosis not present

## 2017-10-05 DIAGNOSIS — K219 Gastro-esophageal reflux disease without esophagitis: Secondary | ICD-10-CM | POA: Diagnosis present

## 2017-10-05 DIAGNOSIS — Z7982 Long term (current) use of aspirin: Secondary | ICD-10-CM

## 2017-10-05 DIAGNOSIS — E1165 Type 2 diabetes mellitus with hyperglycemia: Secondary | ICD-10-CM | POA: Diagnosis present

## 2017-10-05 DIAGNOSIS — I10 Essential (primary) hypertension: Secondary | ICD-10-CM | POA: Diagnosis present

## 2017-10-05 DIAGNOSIS — J440 Chronic obstructive pulmonary disease with acute lower respiratory infection: Secondary | ICD-10-CM | POA: Diagnosis present

## 2017-10-05 DIAGNOSIS — Z7951 Long term (current) use of inhaled steroids: Secondary | ICD-10-CM

## 2017-10-05 LAB — CBC WITH DIFFERENTIAL/PLATELET
ABS IMMATURE GRANULOCYTES: 0.1 10*3/uL (ref 0.0–0.1)
Basophils Absolute: 0.1 10*3/uL (ref 0.0–0.1)
Basophils Relative: 1 %
Eosinophils Absolute: 0 10*3/uL (ref 0.0–0.7)
Eosinophils Relative: 0 %
HEMATOCRIT: 32.7 % — AB (ref 36.0–46.0)
Hemoglobin: 10.2 g/dL — ABNORMAL LOW (ref 12.0–15.0)
Immature Granulocytes: 1 %
Lymphocytes Relative: 20 %
Lymphs Abs: 1.5 10*3/uL (ref 0.7–4.0)
MCH: 31.2 pg (ref 26.0–34.0)
MCHC: 31.2 g/dL (ref 30.0–36.0)
MCV: 100 fL (ref 78.0–100.0)
MONO ABS: 1.1 10*3/uL — AB (ref 0.1–1.0)
MONOS PCT: 15 %
NEUTROS ABS: 4.5 10*3/uL (ref 1.7–7.7)
Neutrophils Relative %: 63 %
Platelets: 265 10*3/uL (ref 150–400)
RBC: 3.27 MIL/uL — ABNORMAL LOW (ref 3.87–5.11)
RDW: 14 % (ref 11.5–15.5)
WBC: 7.2 10*3/uL (ref 4.0–10.5)

## 2017-10-05 LAB — I-STAT TROPONIN, ED
TROPONIN I, POC: 0 ng/mL (ref 0.00–0.08)
Troponin i, poc: 0 ng/mL (ref 0.00–0.08)

## 2017-10-05 LAB — BASIC METABOLIC PANEL
ANION GAP: 7 (ref 5–15)
BUN: 18 mg/dL (ref 6–20)
CO2: 29 mmol/L (ref 22–32)
Calcium: 9.1 mg/dL (ref 8.9–10.3)
Chloride: 106 mmol/L (ref 101–111)
Creatinine, Ser: 1.01 mg/dL — ABNORMAL HIGH (ref 0.44–1.00)
GFR calc Af Amer: >60 mL/min (ref >60)
GFR calc non Af Amer: 56 mL/min — ABNORMAL LOW (ref >60)
GLUCOSE: 116 mg/dL — AB (ref 65–99)
POTASSIUM: 3.9 mmol/L (ref 3.5–5.1)
Sodium: 142 mmol/L (ref 135–145)

## 2017-10-05 LAB — BRAIN NATRIURETIC PEPTIDE: B Natriuretic Peptide: 30 pg/mL (ref 0.0–100.0)

## 2017-10-05 MED ORDER — PREDNISONE 20 MG PO TABS: 60.0000 mg | ORAL_TABLET | Freq: Once | ORAL | Status: DC

## 2017-10-05 MED ORDER — ALBUTEROL SULFATE (2.5 MG/3ML) 0.083% IN NEBU
5.0000 mg | INHALATION_SOLUTION | Freq: Once | RESPIRATORY_TRACT | Status: AC
  Administered 2017-10-05: 5 mg via RESPIRATORY_TRACT
  Filled 2017-10-05: qty 6

## 2017-10-05 MED ORDER — PREDNISONE 10 MG PO TABS: ORAL_TABLET | ORAL | 0 refills | Status: DC

## 2017-10-05 MED ORDER — MAGNESIUM SULFATE 2 GM/50ML IV SOLN
2.0000 g | Freq: Once | INTRAVENOUS | Status: AC
  Administered 2017-10-05: 2 g via INTRAVENOUS
  Filled 2017-10-05: qty 50

## 2017-10-05 MED ORDER — IPRATROPIUM-ALBUTEROL 0.5-2.5 (3) MG/3ML IN SOLN
3.0000 mL | Freq: Once | RESPIRATORY_TRACT | Status: AC
  Administered 2017-10-05: 3 mL via RESPIRATORY_TRACT
  Filled 2017-10-05: qty 3

## 2017-10-05 MED ORDER — METHYLPREDNISOLONE SODIUM SUCC 125 MG IJ SOLR
125.0000 mg | Freq: Once | INTRAMUSCULAR | Status: AC
  Administered 2017-10-05: 125 mg via INTRAVENOUS
  Filled 2017-10-05: qty 2

## 2017-10-05 NOTE — Telephone Encounter (Signed)
Spoke with pt. States that she is very short of breath. Pt left a message earlier today and Dr. Melvyn Novas did address it. She states that no called her even though it was documented that she was called. Pt has been relayed the message from earlier. While on the phone with the pt she could barely form a sentence because she was so short of breath. I advised the pt that she needed to call 911 so that she could be evaluated. She agreed. Nothing further was needed.

## 2017-10-05 NOTE — Telephone Encounter (Signed)
Attempted to call pt. I did not receive an answer. I have left a message for pt to return our call.  

## 2017-10-05 NOTE — ED Provider Notes (Addendum)
[facility] EMERGENCY DEPARTMENT Provider Note   CSN: [id number] Arrival date & time: 10/05/17  1454     History   Chief Complaint Chief Complaint  Patient presents with  . Asthma    HPI [name] is a 67 y.o. female.  The history is provided by the patient, medical records and a relative.  Wheezing   This is a recurrent problem. The current episode started more than 2 days ago. The problem occurs constantly. The problem has not changed since onset.Associated symptoms include rhinorrhea and cough. Pertinent negatives include no chest pain, no fever, no abdominal pain, no vomiting, no diarrhea, no headaches, no neck pain and no sputum production. It is unknown what precipitated the problem. She has tried beta-agonist inhalers for the symptoms. The treatment provided no relief. She has had prior hospitalizations. She has had prior ED visits.    Past Medical History:  Diagnosis Date  . Anxiety   . Asthma   . Bronchitis   . Chronic diastolic CHF (congestive heart failure) ([place])   . Chronic diastolic CHF (congestive heart failure) ([place]) 07/17/2016  . Coronary artery disease    a. NSTEMI 12/2015 - LHC 01/18/16: s/p overlapping DESx2 to RCA, 10% ost-prox Cx,10% mLAD.  Marland Kitchen Depression   . Diabetes mellitus   . Dyslipidemia   . GERD (gastroesophageal reflux disease)   . Heart attack ([place])   . Hypertension   . Hypertensive heart disease   . NSTEMI (non-ST elevated myocardial infarction) ([place]) 12/2015  . Pneumonia     Patient Active Problem List   Diagnosis Date Noted  . Dyspnea 08/12/2017  . Anemia 08/12/2017  . Therapeutic drug monitoring 05/17/2017  . Lung nodule 08/01/2016  . Influenza B 07/18/2016  . Chronic diastolic CHF (congestive heart failure) ([place]) 07/17/2016  . Acute renal failure superimposed on stage 3 chronic kidney disease ([place]) 07/17/2016  . Right leg swelling 02/27/2016  . Acute respiratory failure with hypoxia ([place])   . Uncontrolled type  2 diabetes mellitus with complication ([place])   . Acute pulmonary embolism ([place])   . HLD (hyperlipidemia)   . Seizures ([place]) 01/27/2016  . Numbness 01/27/2016  . CAD in native artery 01/20/2016  . Hypertensive heart disease 01/20/2016  . NSTEMI (non-ST elevated myocardial infarction) ([place]) 01/18/2016  . Acute bronchitis with asthma with acute exacerbation 01/16/2016  . Hypokalemia 01/16/2016  . Hypomagnesemia 01/16/2016  . Acute respiratory failure ([place]) 01/16/2016  . Dyspnea on exertion   . Chest pain   . Asthma exacerbation 07/21/2015  . Upper airway cough syndrome vs Asthma  08/28/2014  . Diabetes mellitus type 2, controlled ([place]) 08/06/2014  . Hyperlipidemia 08/06/2014  . Allergic rhinitis 07/22/2008  . 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 DYSPNEA 07/22/2008  . Diabetes mellitus type 2, uncontrolled ([place]) 07/21/2008  . DIABETIC PERIPHERAL NEUROPATHY 07/21/2008  . Depression with anxiety 07/21/2008  . Essential hypertension 07/21/2008  . Mild persistent chronic asthma without complication 23/53/6144  . G E R D 07/21/2008  . PULMONARY EMBOLISM, HX OF 07/21/2008    Past Surgical History:  Procedure Laterality Date  . ABDOMINAL HYSTERECTOMY    . CARDIAC CATHETERIZATION  1994   minimal LAD dz, no other dz, EF normal  . CARDIAC CATHETERIZATION N/A 01/18/2016   Procedure: Left Heart Cath and Coronary Angiography;  Surgeon: Burnell Blanks, MD;  Location: [facility];  Service: Cardiovascular;  Laterality: N/A;  . CARDIAC CATHETERIZATION N/A 01/18/2016   Procedure: Coronary Stent  Intervention;  Surgeon: Burnell Blanks, MD;  Location: [facility];  Service: Cardiovascular;  Laterality: N/A;  . COLONOSCOPY WITH PROPOFOL N/A 04/09/2014   Procedure: COLONOSCOPY WITH PROPOFOL;  Surgeon: Cleotis Nipper, MD;  Location: [facility];  Service: Endoscopy;  Laterality: N/A;  . CORONARY STENT PLACEMENT  01/19/2016   STENT SYNERGY DES 3.38S50 drug eluting  stent was successfully placed, and overlaps the 2.5 x 38 mm Synergy stent placed distally.  . ESOPHAGOGASTRODUODENOSCOPY (EGD) WITH PROPOFOL N/A 04/09/2014   Procedure: ESOPHAGOGASTRODUODENOSCOPY (EGD) WITH PROPOFOL;  Surgeon: Cleotis Nipper, MD;  Location: [facility];  Service: Endoscopy;  Laterality: N/A;  . HEMORRHOID SURGERY    . HERNIA REPAIR    . KNEE ARTHROSCOPY       OB History   None      Home Medications    Prior to Admission medications   Medication Sig Start Date End Date Taking? Authorizing Provider  albuterol (PROAIR HFA) 108 (90 Base) MCG/ACT inhaler Inhale 2 puffs into the lungs every 4 (four) hours as needed for wheezing or shortness of breath. 06/29/17   Hayden Rasmussen, MD  albuterol (PROVENTIL) (2.5 MG/3ML) 0.083% nebulizer solution Take 3 mLs (2.5 mg total) by nebulization every 4 (four) hours as needed for shortness of breath. 07/31/15   Theodis Blaze, MD  ALPRAZolam Duanne Moron) 0.5 MG tablet Take 1 tablet (0.5 mg total) by mouth 2 (two) times daily as needed for anxiety. 07/31/15   Theodis Blaze, MD  aspirin EC 81 MG tablet Take 81 mg by mouth daily.     [provider]  atorvastatin (LIPITOR) 40 MG tablet Take 40 mg by mouth daily.    [provider]  Biotin 5000 MCG CAPS Take 1 capsule by mouth daily.    [provider]  budesonide-formoterol (SYMBICORT) 80-4.5 MCG/ACT inhaler Inhale 2 puffs into the lungs 2 (two) times daily. 09/18/17   Tanda Rockers, MD  diltiazem (CARDIZEM CD) 240 MG 24 hr capsule Take 1 capsule (240 mg total) by mouth daily. 09/17/17   Burtis Junes, NP  divalproex (DEPAKOTE ER) 500 MG 24 hr tablet Take 2 tablets (1,000 mg total) by mouth at bedtime. 05/29/17   Dennie Bible, NP  esomeprazole (NEXIUM) 40 MG capsule Take 1 capsule (40 mg total) by mouth 2 (two) times daily before a meal. Take twice a day for 4days then resume once daily Patient taking differently: Take 40 mg by mouth daily at 12 noon. Take  twice a day for 4days then resume once daily 07/21/16   Domenic Polite, MD  estradiol (ESTRACE) 1 MG tablet Take 1 mg by mouth daily.     [provider]  hydrALAZINE (APRESOLINE) 25 MG tablet Take 1 tablet (25 mg total) by mouth 2 (two) times daily. 09/17/17   Burtis Junes, NP  irbesartan (AVAPRO) 300 MG tablet Take 300 mg by mouth daily.  02/18/17   [provider]  loratadine (CLARITIN) 10 MG tablet Take 1 tablet (10 mg total) by mouth daily. 08/18/17   Geradine Girt, DO  metFORMIN (GLUCOPHAGE) 500 MG tablet Take 2 tablets (1,000 mg total) by mouth 2 (two) times daily with a meal. 08/17/17   Eulogio Bear U, DO  nebivolol (BYSTOLIC) 5 MG tablet Take 5 mg by mouth daily.    [provider]  nitroGLYCERIN (NITROSTAT) 0.4 MG SL tablet Place 1 tablet (0.4 mg total) under the tongue every 5 (five) minutes as needed for  chest pain. x3 as needed 09/17/17   Burtis Junes, NP  potassium chloride SA (K-DUR,KLOR-CON) 20 MEQ tablet Take 20 mEq by mouth daily.     [provider]  predniSONE (DELTASONE) 10 MG tablet Take  4 each am x 2 days,   2 each am x 2 days,  1 each am x 2 days and stop 09/18/17   Tanda Rockers, MD  predniSONE (DELTASONE) 10 MG tablet 4eachAMx2day,2inAMx2day,1inAMx2days,stop 10/05/17   Tanda Rockers, MD  ranitidine (ZANTAC) 150 MG capsule Take 150 mg by mouth every evening.    [provider]  torsemide (DEMADEX) 20 MG tablet 20 mg. 1-2 daily for swelling 10/25/16   [provider]    Family History Family History  Problem Relation Age of Onset  . Allergies Mother   . Asthma Mother   . CAD Father 57  . Diabetes Father   . Hypertension Father   . Congestive Heart Failure Sister        died in her 98s  . CAD Sister 39    Social History Social History   Tobacco Use  . Smoking status: Former Smoker    Packs/day: 1.00    Years: 30.00    Pack years: 30.00    Types: Cigarettes    Last attempt to quit: 01/09/2004     Years since quitting: 13.7  . Smokeless tobacco: Never Used  Substance Use Topics  . Alcohol use: No    Alcohol/week: 0.0 oz  . Drug use: No     Allergies   Amlodipine besylate; Lisinopril; and Pantoprazole sodium   Review of Systems Review of Systems  Constitutional: Negative for chills, diaphoresis, fatigue and fever.  HENT: Positive for congestion and rhinorrhea.   Respiratory: Positive for cough, shortness of breath and wheezing. Negative for sputum production, chest tightness and stridor.   Cardiovascular: Positive for leg swelling (at baseline). Negative for chest pain and palpitations.  Gastrointestinal: Negative for abdominal pain, diarrhea, nausea and vomiting.  Genitourinary: Negative for flank pain.  Musculoskeletal: Negative for back pain and neck pain.  Skin: Negative for wound.  Neurological: Negative for light-headedness and headaches.  Psychiatric/Behavioral: Negative for agitation.  All other systems reviewed and are negative.    Physical Exam Updated Vital Signs BP (!) 157/74 (BP Location: Right Arm)   Pulse 86   Temp 98 F (36.7 C) (Oral)   Resp 18   Ht 5\' 2"  (1.575 m)   Wt 74.8 kg (165 lb)   LMP  (LMP Unknown)   SpO2 98%   BMI 30.18 kg/m   Physical Exam  Constitutional: She is oriented to person, place, and time. She appears well-developed and well-nourished. No distress.  HENT:  Head: Normocephalic and atraumatic.  Nose: Nose normal.  Mouth/Throat: Oropharynx is clear and moist. No oropharyngeal exudate.  Eyes: Pupils are equal, round, and reactive to light. EOM are normal.  Cardiovascular: Normal rate and intact distal pulses.  No murmur heard. Pulmonary/Chest: No respiratory distress. She has wheezes. She has no rales. She exhibits tenderness.  Abdominal: Soft. There is no tenderness. There is no guarding.  Musculoskeletal: She exhibits edema (mild bilateral). She exhibits no tenderness.  Neurological: She is alert and oriented to person,  place, and time. No sensory deficit. She exhibits normal muscle tone.  Skin: Capillary refill takes less than 2 seconds. No rash noted. She is not diaphoretic. No erythema.  Nursing note and vitals reviewed.    ED Treatments / Results  Labs (  all labs ordered are listed, but only abnormal results are displayed) Labs Reviewed  BASIC METABOLIC PANEL - Abnormal; Notable for the following components:      Result Value   Glucose, Bld 116 (*)    Creatinine, Ser 1.01 (*)    GFR calc non Af Amer 56 (*)    All other components within normal limits  CBC WITH DIFFERENTIAL/PLATELET - Abnormal; Notable for the following components:   RBC 3.27 (*)    Hemoglobin 10.2 (*)    HCT 32.7 (*)    Monocytes Absolute 1.1 (*)    All other components within normal limits  GLUCOSE, CAPILLARY - Abnormal; Notable for the following components:   Glucose-Capillary 337 (*)    All other components within normal limits  RESPIRATORY PANEL BY PCR  BRAIN NATRIURETIC PEPTIDE  TROPONIN I  TROPONIN I  TROPONIN I  BLOOD GAS, VENOUS  VITAMIN B12  FOLATE  IRON AND TIBC  FERRITIN  RETICULOCYTES  HEMOGLOBIN A1C  VALPROIC ACID LEVEL  HIV ANTIBODY (ROUTINE TESTING)  MAGNESIUM  PHOSPHORUS  TSH  COMPREHENSIVE METABOLIC PANEL  CBC  I-STAT TROPONIN, ED  I-STAT TROPONIN, ED    EKG EKG Interpretation  Date/Time:  Friday October 05 2017 16:22:27 EDT Ventricular Rate:  85 PR Interval:  166 QRS Duration: 82 QT Interval:  360 QTC Calculation: 428 R Axis:   10 Text Interpretation:  Normal sinus rhythm Nonspecific T wave abnormality Abnormal ECG When compared to prior, no significant changes seen.  No STEMI Confirmed by Antony Blackbird [phone]) on 10/05/2017 4:51:59 PM   Radiology Dg Chest 2 View  Result Date: 10/05/2017 CLINICAL DATA:  Asthma exacerbation. Cough for 2 days. Hypertension. Ex-smoker. EXAM: CHEST - 2 VIEW COMPARISON:  08/12/2017 FINDINGS: Midline trachea. Mild cardiomegaly. Tortuous thoracic aorta. No  pleural effusion or pneumothorax. Clear lungs. IMPRESSION: Mild cardiomegaly, without acute disease. Electronically Signed   By: Abigail Miyamoto M.D.   On: 10/05/2017 15:59    Procedures Procedures (including critical care time)  CRITICAL CARE Performed by: Gwenyth Allegra Tegeler Total critical care time: 40 minutes Critical care time was exclusive of separately billable procedures and treating other patients. Critical care was necessary to treat or prevent imminent or life-threatening deterioration. Critical care was time spent personally by me on the following activities: development of treatment plan with patient and/or surrogate as well as nursing, discussions with consultants, evaluation of patient's response to treatment, examination of patient, obtaining history from patient or surrogate, ordering and performing treatments and interventions, ordering and review of laboratory studies, ordering and review of radiographic studies, pulse oximetry and re-evaluation of patient's condition.   Medications Ordered in ED Medications  albuterol (PROVENTIL) (2.5 MG/3ML) 0.083% nebulizer solution 5 mg (5 mg Nebulization Given 10/05/17 1528)  ipratropium-albuterol (DUONEB) 0.5-2.5 (3) MG/3ML nebulizer solution 3 mL (3 mLs Nebulization Given 10/05/17 1723)  methylPREDNISolone sodium succinate (SOLU-MEDROL) 125 mg/2 mL injection 125 mg (125 mg Intravenous Given 10/05/17 1820)  magnesium sulfate IVPB 2 g 50 mL (0 g Intravenous Stopped 10/05/17 1947)  ipratropium-albuterol (DUONEB) 0.5-2.5 (3) MG/3ML nebulizer solution 3 mL (3 mLs Nebulization Given 10/05/17 2339)     Initial Impression / Assessment and Plan / ED Course  I have reviewed the triage vital signs and the nursing notes.  Pertinent labs & imaging results that were available during my care of the patient were reviewed by me and considered in my medical decision making (see chart for details).     [name] is a 67  y.o. female with a past medical  history significant for severe asthma, diabetes, hypertension, hyperlipidemia, CHF, and CAD status post PCI who presents with wheezing, shortness of breath, and cough.  Patient reports that for the last 3 days she has had worsening breathing.  She reports that she has had severe wheezing that has not responded to her home breathing treatments and Robitussin.  She reports she had a nonproductive cough.  She does have some sick contacts with a great grandchild who has had URI symptoms.  Patient did report some rhinorrhea and congestion.  She reports she does have a mild chest tightness and sharp chest discomfort when she is having coughing but no resting or baseline pain.  She says this does not feel anything like her prior MI or cardiac pain.  She denies nausea vomiting, diaphoresis, syncope, or other symptoms.  She denies cuts patient, diarrhea.  She denies any worsening of her chronic lower extremity edema with a CHF.  On exam, patient has decreased breath sounds in all lung fields with severe wheezing.  Patient had some chest tenderness reproducing the pain with her coughing.  Back was nontender.  Abdomen was nontender.  Patient had minimal edema in her legs but had symmetric pulses.  Clinically concerned about asthma exacerbation similar to prior.  Patient thinks that she may be able to go home if her breathing is improved.  Given patient's history of CHF and CAD she will have troponin and a BNP to further evaluate.  Chest x-ray will be obtained.  Patient was given Solu-Medrol and magnesium given the severe wheezing.  Anticipate ambulation with pulse ox of the patient after breathing treatments to determine if she is safe for discharge home.  Next  EKG did not show STEMI and initial troponin was negative.  Chest x-ray shows cardiomegaly with no acute disease and no pneumonia.  Anticipate reassessment after treatments.  Patient's diagnostic work-up was reassuring.  Troponin negative x2.  BMP and CBC  showed mild anemia but otherwise reassuring.    Patient had the steroids, DuoNeb x2, and magnesium and was still having significant wheezing.  Patient tried to walk to the bathroom and after taking several steps was completely winded and symptomatic.  Patient does not feel safe going home with her wheezing being persistent.  Hospitalist team will be called for admission given patient's failure of improvement in her asthma exacerbation.   Final Clinical Impressions(s) / ED Diagnoses   Final diagnoses:  Moderate persistent asthma with exacerbation     Clinical Impression: 1. Moderate persistent asthma with exacerbation     Disposition: Admit  This note was prepared with assistance of Dragon voice recognition software. Occasional wrong-word or sound-a-like substitutions may have occurred due to the inherent limitations of voice recognition software.      Tegeler, Gwenyth Allegra, MD 10/06/17 7867    Tegeler, Gwenyth Allegra, MD 10/16/17 1315

## 2017-10-05 NOTE — ED Notes (Signed)
Family and patient asked with the plan of care was.  This RN explained that the physician needs to speak with them again to assess the need to further breathing treatments.

## 2017-10-05 NOTE — Telephone Encounter (Signed)
Prednisone 10 mg take  4 each am x 2 days,   2 each am x 2 days,  1 each am x 2 days and stop   Be sure to keep appt with [name] with all active  meds in hand

## 2017-10-05 NOTE — ED Triage Notes (Signed)
Pt endorses asthma exacerbation x 2 days. Has been using inhaler and neb with only temporary relief. Speaking in complete sentences. Audible wheezing. 99% on RA.

## 2017-10-05 NOTE — Telephone Encounter (Signed)
Patient returned call.  She stated that she has chest congestion and wheezing that started 2 days ago and has gotten worse.  She has cough, but is unable to cough anything up.  She has had no fever. She has used her albuterol nebs every 4 hours and her albuterol inhaler.  She started Robitussin OTC cough med yesterday, with no relief.  Her preferred pharmacy is [facility].    MW please advise

## 2017-10-05 NOTE — ED Provider Notes (Addendum)
Patient placed in Quick Look pathway, seen and evaluated   Chief Complaint: SOB  HPI:   67 year old female presents with SOB and wheezing consistent with prior asthma attacks. Symptoms have been going on 3 days. She's been using home nebs without relief. She was around her granddaughter who had a URI but she does not have any URI symptoms. She denies chest pain but has some back pain. She has mild swelling in her lower extremities and states she usually takes a fluid pill for this and has not taken it today. Has had intubation for asthma  ROS: +SOB  Physical Exam:   Gen: No distress  Neuro: Awake and Alert  Skin: Warm    Focused Exam: Heart: Regular rate and rhythm    Lungs: Diffuse wheezing    Legs: Trace edema   Initiation of care has begun. The patient has been counseled on the process, plan, and necessity for staying for the completion/evaluation, and the remainder of the medical screening examination      Recardo Evangelist, PA-C 10/05/17 [place], Kevin, MD 10/05/17 [phone]

## 2017-10-05 NOTE — Telephone Encounter (Signed)
Per MW- Prednisone 10mg , 4 each AM x's 2 days, 2 each AM x's 2 days, 1 each  AM x's 2 days, then stop.  Patient notified and reminded of upcoming appointment with TP 10/16/17, and to bring all medications with her to [place]. Prescription sent to preferred pharmacy,  [facility].  Nothing further at this time.

## 2017-10-05 NOTE — ED Notes (Signed)
Pt requesting ginger ale OK per RN Sarah pt given the same

## 2017-10-06 DIAGNOSIS — J209 Acute bronchitis, unspecified: Secondary | ICD-10-CM

## 2017-10-06 DIAGNOSIS — E119 Type 2 diabetes mellitus without complications: Secondary | ICD-10-CM | POA: Diagnosis not present

## 2017-10-06 DIAGNOSIS — I10 Essential (primary) hypertension: Secondary | ICD-10-CM | POA: Diagnosis not present

## 2017-10-06 DIAGNOSIS — I2510 Atherosclerotic heart disease of native coronary artery without angina pectoris: Secondary | ICD-10-CM

## 2017-10-06 DIAGNOSIS — N179 Acute kidney failure, unspecified: Secondary | ICD-10-CM | POA: Diagnosis present

## 2017-10-06 DIAGNOSIS — R569 Unspecified convulsions: Secondary | ICD-10-CM

## 2017-10-06 DIAGNOSIS — D509 Iron deficiency anemia, unspecified: Secondary | ICD-10-CM | POA: Diagnosis present

## 2017-10-06 DIAGNOSIS — I110 Hypertensive heart disease with heart failure: Secondary | ICD-10-CM | POA: Diagnosis present

## 2017-10-06 DIAGNOSIS — J4541 Moderate persistent asthma with (acute) exacerbation: Secondary | ICD-10-CM

## 2017-10-06 DIAGNOSIS — J449 Chronic obstructive pulmonary disease, unspecified: Secondary | ICD-10-CM | POA: Diagnosis present

## 2017-10-06 DIAGNOSIS — J440 Chronic obstructive pulmonary disease with acute lower respiratory infection: Secondary | ICD-10-CM | POA: Diagnosis present

## 2017-10-06 DIAGNOSIS — K219 Gastro-esophageal reflux disease without esophagitis: Secondary | ICD-10-CM | POA: Diagnosis present

## 2017-10-06 DIAGNOSIS — D539 Nutritional anemia, unspecified: Secondary | ICD-10-CM

## 2017-10-06 DIAGNOSIS — J45901 Unspecified asthma with (acute) exacerbation: Secondary | ICD-10-CM

## 2017-10-06 DIAGNOSIS — E1142 Type 2 diabetes mellitus with diabetic polyneuropathy: Secondary | ICD-10-CM | POA: Diagnosis present

## 2017-10-06 DIAGNOSIS — Z86718 Personal history of other venous thrombosis and embolism: Secondary | ICD-10-CM

## 2017-10-06 DIAGNOSIS — Z87891 Personal history of nicotine dependence: Secondary | ICD-10-CM | POA: Diagnosis not present

## 2017-10-06 DIAGNOSIS — I252 Old myocardial infarction: Secondary | ICD-10-CM | POA: Diagnosis not present

## 2017-10-06 DIAGNOSIS — G40909 Epilepsy, unspecified, not intractable, without status epilepticus: Secondary | ICD-10-CM | POA: Diagnosis present

## 2017-10-06 DIAGNOSIS — Z66 Do not resuscitate: Secondary | ICD-10-CM | POA: Diagnosis present

## 2017-10-06 DIAGNOSIS — R079 Chest pain, unspecified: Secondary | ICD-10-CM | POA: Diagnosis not present

## 2017-10-06 DIAGNOSIS — J9600 Acute respiratory failure, unspecified whether with hypoxia or hypercapnia: Secondary | ICD-10-CM

## 2017-10-06 DIAGNOSIS — T380X5A Adverse effect of glucocorticoids and synthetic analogues, initial encounter: Secondary | ICD-10-CM | POA: Diagnosis not present

## 2017-10-06 DIAGNOSIS — I5033 Acute on chronic diastolic (congestive) heart failure: Secondary | ICD-10-CM | POA: Diagnosis present

## 2017-10-06 DIAGNOSIS — I5032 Chronic diastolic (congestive) heart failure: Secondary | ICD-10-CM | POA: Diagnosis not present

## 2017-10-06 DIAGNOSIS — F418 Other specified anxiety disorders: Secondary | ICD-10-CM | POA: Diagnosis present

## 2017-10-06 DIAGNOSIS — Z955 Presence of coronary angioplasty implant and graft: Secondary | ICD-10-CM | POA: Diagnosis not present

## 2017-10-06 DIAGNOSIS — E785 Hyperlipidemia, unspecified: Secondary | ICD-10-CM | POA: Diagnosis present

## 2017-10-06 DIAGNOSIS — R0789 Other chest pain: Secondary | ICD-10-CM | POA: Diagnosis present

## 2017-10-06 DIAGNOSIS — R609 Edema, unspecified: Secondary | ICD-10-CM | POA: Diagnosis not present

## 2017-10-06 DIAGNOSIS — J9601 Acute respiratory failure with hypoxia: Secondary | ICD-10-CM | POA: Diagnosis present

## 2017-10-06 DIAGNOSIS — Z86711 Personal history of pulmonary embolism: Secondary | ICD-10-CM | POA: Diagnosis not present

## 2017-10-06 DIAGNOSIS — D649 Anemia, unspecified: Secondary | ICD-10-CM | POA: Diagnosis not present

## 2017-10-06 DIAGNOSIS — Z794 Long term (current) use of insulin: Secondary | ICD-10-CM | POA: Diagnosis not present

## 2017-10-06 DIAGNOSIS — J4551 Severe persistent asthma with (acute) exacerbation: Secondary | ICD-10-CM | POA: Diagnosis not present

## 2017-10-06 DIAGNOSIS — E1165 Type 2 diabetes mellitus with hyperglycemia: Secondary | ICD-10-CM | POA: Diagnosis present

## 2017-10-06 LAB — COMPREHENSIVE METABOLIC PANEL
ALT: 17 U/L (ref 14–54)
AST: 22 U/L (ref 15–41)
Albumin: 3.4 g/dL — ABNORMAL LOW (ref 3.5–5.0)
Alkaline Phosphatase: 48 U/L (ref 38–126)
Anion gap: 15 (ref 5–15)
BILIRUBIN TOTAL: 0.5 mg/dL (ref 0.3–1.2)
BUN: 23 mg/dL — ABNORMAL HIGH (ref 6–20)
CO2: 18 mmol/L — ABNORMAL LOW (ref 22–32)
Calcium: 8.6 mg/dL — ABNORMAL LOW (ref 8.9–10.3)
Chloride: 102 mmol/L (ref 101–111)
Creatinine, Ser: 1.3 mg/dL — ABNORMAL HIGH (ref 0.44–1.00)
GFR, EST AFRICAN AMERICAN: 48 mL/min — AB (ref >60)
GFR, EST NON AFRICAN AMERICAN: 41 mL/min — AB (ref >60)
Glucose, Bld: 365 mg/dL — ABNORMAL HIGH (ref 65–99)
POTASSIUM: 3.5 mmol/L (ref 3.5–5.1)
Sodium: 135 mmol/L (ref 135–145)
TOTAL PROTEIN: 7.2 g/dL (ref 6.5–8.1)

## 2017-10-06 LAB — GLUCOSE, CAPILLARY
GLUCOSE-CAPILLARY: 337 mg/dL — AB (ref 65–99)
GLUCOSE-CAPILLARY: 331 mg/dL — AB (ref 65–99)
Glucose-Capillary: 334 mg/dL — ABNORMAL HIGH (ref 65–99)
Glucose-Capillary: 259 mg/dL — ABNORMAL HIGH (ref 65–99)
Glucose-Capillary: 228 mg/dL — ABNORMAL HIGH (ref 65–99)
Glucose-Capillary: 251 mg/dL — ABNORMAL HIGH (ref 65–99)

## 2017-10-06 LAB — TROPONIN I: Troponin I: <0.03 ng/mL (ref <0.03)

## 2017-10-06 LAB — CBC
HEMATOCRIT: 32 % — AB (ref 36.0–46.0)
HEMOGLOBIN: 10.2 g/dL — AB (ref 12.0–15.0)
MCH: 31.2 pg (ref 26.0–34.0)
MCHC: 31.9 g/dL (ref 30.0–36.0)
MCV: 97.9 fL (ref 78.0–100.0)
Platelets: 297 10*3/uL (ref 150–400)
RBC: 3.27 MIL/uL — ABNORMAL LOW (ref 3.87–5.11)
RDW: 14 % (ref 11.5–15.5)
WBC: 9.6 10*3/uL (ref 4.0–10.5)

## 2017-10-06 LAB — HEMOGLOBIN A1C
Hgb A1c MFr Bld: 7.3 % — ABNORMAL HIGH (ref 4.8–5.6)
MEAN PLASMA GLUCOSE: 162.81 mg/dL

## 2017-10-06 LAB — IRON AND TIBC
Iron: 72 ug/dL (ref 28–170)
SATURATION RATIOS: 17 % (ref 10.4–31.8)
TIBC: 426 ug/dL (ref 250–450)
UIBC: 354 ug/dL

## 2017-10-06 LAB — RETICULOCYTES
RBC.: 3.27 MIL/uL — AB (ref 3.87–5.11)
RETIC COUNT ABSOLUTE: 81.8 10*3/uL (ref 19.0–186.0)
Retic Ct Pct: 2.5 % (ref 0.4–3.1)

## 2017-10-06 LAB — TSH: TSH: 0.534 u[IU]/mL (ref 0.350–4.500)

## 2017-10-06 LAB — VITAMIN B12: VITAMIN B 12: 589 pg/mL (ref 180–914)

## 2017-10-06 LAB — D-DIMER, QUANTITATIVE (NOT AT ARMC): D DIMER QUANT: 0.41 ug{FEU}/mL (ref 0.00–0.50)

## 2017-10-06 LAB — FOLATE: FOLATE: 15 ng/mL (ref >5.9)

## 2017-10-06 LAB — FERRITIN: Ferritin: 135 ng/mL (ref 11–307)

## 2017-10-06 LAB — PHOSPHORUS: PHOSPHORUS: 2.9 mg/dL (ref 2.5–4.6)

## 2017-10-06 LAB — VALPROIC ACID LEVEL: Valproic Acid Lvl: 26 ug/mL — ABNORMAL LOW (ref 50.0–100.0)

## 2017-10-06 LAB — MAGNESIUM: MAGNESIUM: 1.7 mg/dL (ref 1.7–2.4)

## 2017-10-06 MED ORDER — ATORVASTATIN CALCIUM 40 MG PO TABS
40.0000 mg | ORAL_TABLET | Freq: Every day | ORAL | Status: DC
  Administered 2017-10-06 – 2017-10-18 (×13): 40 mg via ORAL
  Filled 2017-10-06 (×13): qty 1

## 2017-10-06 MED ORDER — METHYLPREDNISOLONE SODIUM SUCC 125 MG IJ SOLR
60.0000 mg | Freq: Three times a day (TID) | INTRAMUSCULAR | Status: DC
  Administered 2017-10-06 – 2017-10-08 (×7): 60 mg via INTRAVENOUS
  Filled 2017-10-06 (×7): qty 2

## 2017-10-06 MED ORDER — SODIUM CHLORIDE 0.9 % IV SOLN
INTRAVENOUS | Status: AC
  Administered 2017-10-06: 03:00:00 via INTRAVENOUS

## 2017-10-06 MED ORDER — IPRATROPIUM-ALBUTEROL 0.5-2.5 (3) MG/3ML IN SOLN
3.0000 mL | Freq: Four times a day (QID) | RESPIRATORY_TRACT | Status: DC
  Administered 2017-10-06 – 2017-10-07 (×5): 3 mL via RESPIRATORY_TRACT
  Filled 2017-10-06 (×4): qty 3

## 2017-10-06 MED ORDER — LORATADINE 10 MG PO TABS
10.0000 mg | ORAL_TABLET | Freq: Every day | ORAL | Status: DC
  Administered 2017-10-06 – 2017-10-18 (×13): 10 mg via ORAL
  Filled 2017-10-06 (×13): qty 1

## 2017-10-06 MED ORDER — ENOXAPARIN SODIUM 40 MG/0.4ML ~~LOC~~ SOLN
40.0000 mg | SUBCUTANEOUS | Status: DC
  Administered 2017-10-06 – 2017-10-18 (×13): 40 mg via SUBCUTANEOUS
  Filled 2017-10-06 (×13): qty 0.4

## 2017-10-06 MED ORDER — ACETAMINOPHEN 650 MG RE SUPP: 650.0000 mg | Freq: Four times a day (QID) | RECTAL | Status: DC | PRN

## 2017-10-06 MED ORDER — TORSEMIDE 20 MG PO TABS
20.0000 mg | ORAL_TABLET | Freq: Every day | ORAL | Status: DC
  Administered 2017-10-06 – 2017-10-18 (×13): 20 mg via ORAL
  Filled 2017-10-06 (×13): qty 1

## 2017-10-06 MED ORDER — MAGNESIUM SULFATE 2 GM/50ML IV SOLN
2.0000 g | Freq: Once | INTRAVENOUS | Status: AC
  Administered 2017-10-06: 2 g via INTRAVENOUS
  Filled 2017-10-06: qty 50

## 2017-10-06 MED ORDER — BUDESONIDE 0.5 MG/2ML IN SUSP
0.5000 mg | Freq: Two times a day (BID) | RESPIRATORY_TRACT | Status: DC
  Administered 2017-10-06 – 2017-10-18 (×21): 0.5 mg via RESPIRATORY_TRACT
  Filled 2017-10-06 (×25): qty 2

## 2017-10-06 MED ORDER — IRBESARTAN 300 MG PO TABS: 300.0000 mg | ORAL_TABLET | Freq: Every day | ORAL | Status: DC

## 2017-10-06 MED ORDER — NEBIVOLOL HCL 10 MG PO TABS
5.0000 mg | ORAL_TABLET | Freq: Every day | ORAL | Status: DC
  Administered 2017-10-06 – 2017-10-18 (×13): 5 mg via ORAL
  Filled 2017-10-06 (×13): qty 1

## 2017-10-06 MED ORDER — ALBUTEROL SULFATE (2.5 MG/3ML) 0.083% IN NEBU
2.5000 mg | INHALATION_SOLUTION | RESPIRATORY_TRACT | Status: DC | PRN
  Administered 2017-10-06 – 2017-10-09 (×4): 2.5 mg via RESPIRATORY_TRACT
  Filled 2017-10-06 (×4): qty 3

## 2017-10-06 MED ORDER — ASPIRIN EC 81 MG PO TBEC
81.0000 mg | DELAYED_RELEASE_TABLET | Freq: Every day | ORAL | Status: DC
  Administered 2017-10-06 – 2017-10-18 (×13): 81 mg via ORAL
  Filled 2017-10-06 (×13): qty 1

## 2017-10-06 MED ORDER — GUAIFENESIN ER 600 MG PO TB12
600.0000 mg | ORAL_TABLET | Freq: Two times a day (BID) | ORAL | Status: DC
  Administered 2017-10-06 – 2017-10-18 (×26): 600 mg via ORAL
  Filled 2017-10-06 (×26): qty 1

## 2017-10-06 MED ORDER — FAMOTIDINE 20 MG PO TABS
10.0000 mg | ORAL_TABLET | Freq: Every day | ORAL | Status: DC
  Administered 2017-10-06 – 2017-10-18 (×13): 10 mg via ORAL
  Filled 2017-10-06 (×13): qty 1

## 2017-10-06 MED ORDER — ACETAMINOPHEN 325 MG PO TABS: 650.0000 mg | ORAL_TABLET | Freq: Four times a day (QID) | ORAL | Status: DC | PRN

## 2017-10-06 MED ORDER — ONDANSETRON HCL 4 MG/2ML IJ SOLN: 4.0000 mg | Freq: Four times a day (QID) | INTRAMUSCULAR | Status: DC | PRN

## 2017-10-06 MED ORDER — DIVALPROEX SODIUM ER 500 MG PO TB24
1000.0000 mg | ORAL_TABLET | Freq: Every day | ORAL | Status: DC
  Administered 2017-10-06 – 2017-10-17 (×12): 1000 mg via ORAL
  Filled 2017-10-06 (×13): qty 2

## 2017-10-06 MED ORDER — INSULIN ASPART 100 UNIT/ML ~~LOC~~ SOLN
0.0000 [IU] | Freq: Every day | SUBCUTANEOUS | Status: DC
  Administered 2017-10-06: 3 [IU] via SUBCUTANEOUS
  Administered 2017-10-06 – 2017-10-07 (×2): 4 [IU] via SUBCUTANEOUS
  Administered 2017-10-08: 5 [IU] via SUBCUTANEOUS
  Administered 2017-10-09 – 2017-10-10 (×2): 3 [IU] via SUBCUTANEOUS
  Administered 2017-10-11: 2 [IU] via SUBCUTANEOUS
  Administered 2017-10-12 – 2017-10-15 (×2): 4 [IU] via SUBCUTANEOUS

## 2017-10-06 MED ORDER — INSULIN ASPART 100 UNIT/ML ~~LOC~~ SOLN
0.0000 [IU] | Freq: Three times a day (TID) | SUBCUTANEOUS | Status: DC
  Administered 2017-10-06: 11 [IU] via SUBCUTANEOUS

## 2017-10-06 MED ORDER — IPRATROPIUM BROMIDE 0.02 % IN SOLN
0.5000 mg | Freq: Four times a day (QID) | RESPIRATORY_TRACT | Status: DC
  Administered 2017-10-06: 0.5 mg via RESPIRATORY_TRACT
  Filled 2017-10-06: qty 2.5

## 2017-10-06 MED ORDER — INSULIN ASPART 100 UNIT/ML ~~LOC~~ SOLN
0.0000 [IU] | Freq: Three times a day (TID) | SUBCUTANEOUS | Status: DC
  Administered 2017-10-06: 7 [IU] via SUBCUTANEOUS
  Administered 2017-10-06: 3 [IU] via SUBCUTANEOUS
  Administered 2017-10-07 (×3): 7 [IU] via SUBCUTANEOUS
  Administered 2017-10-08: 11 [IU] via SUBCUTANEOUS
  Administered 2017-10-08 (×2): 7 [IU] via SUBCUTANEOUS
  Administered 2017-10-09: 20 [IU] via SUBCUTANEOUS
  Administered 2017-10-09: 11 [IU] via SUBCUTANEOUS
  Administered 2017-10-09: 15 [IU] via SUBCUTANEOUS
  Administered 2017-10-10: 7 [IU] via SUBCUTANEOUS
  Administered 2017-10-10: 4 [IU] via SUBCUTANEOUS
  Administered 2017-10-10: 7 [IU] via SUBCUTANEOUS
  Administered 2017-10-11: 11 [IU] via SUBCUTANEOUS
  Administered 2017-10-11: 3 [IU] via SUBCUTANEOUS
  Administered 2017-10-11: 15 [IU] via SUBCUTANEOUS
  Administered 2017-10-12: 11 [IU] via SUBCUTANEOUS
  Administered 2017-10-12 (×2): 3 [IU] via SUBCUTANEOUS
  Administered 2017-10-13 (×3): 4 [IU] via SUBCUTANEOUS
  Administered 2017-10-14: 15 [IU] via SUBCUTANEOUS
  Administered 2017-10-14 – 2017-10-15 (×2): 7 [IU] via SUBCUTANEOUS
  Administered 2017-10-15 (×2): 11 [IU] via SUBCUTANEOUS
  Administered 2017-10-16: 20 [IU] via SUBCUTANEOUS
  Administered 2017-10-16: 11 [IU] via SUBCUTANEOUS
  Administered 2017-10-16: 4 [IU] via SUBCUTANEOUS
  Administered 2017-10-17 (×2): 15 [IU] via SUBCUTANEOUS
  Administered 2017-10-17: 11 [IU] via SUBCUTANEOUS
  Administered 2017-10-18: 7 [IU] via SUBCUTANEOUS

## 2017-10-06 MED ORDER — ALBUTEROL (5 MG/ML) CONTINUOUS INHALATION SOLN
7.5000 mg/h | INHALATION_SOLUTION | RESPIRATORY_TRACT | Status: DC
  Administered 2017-10-06: 7.5 mg/h via RESPIRATORY_TRACT
  Filled 2017-10-06: qty 20

## 2017-10-06 MED ORDER — AZITHROMYCIN 500 MG PO TABS
500.0000 mg | ORAL_TABLET | Freq: Every day | ORAL | Status: AC
  Administered 2017-10-06 – 2017-10-08 (×3): 500 mg via ORAL
  Filled 2017-10-06 (×3): qty 1

## 2017-10-06 MED ORDER — ESOMEPRAZOLE MAGNESIUM 40 MG PO CPDR
40.0000 mg | DELAYED_RELEASE_CAPSULE | Freq: Two times a day (BID) | ORAL | Status: DC
  Administered 2017-10-06 – 2017-10-18 (×23): 40 mg via ORAL
  Filled 2017-10-06 (×26): qty 1

## 2017-10-06 MED ORDER — HYDRALAZINE HCL 25 MG PO TABS
25.0000 mg | ORAL_TABLET | Freq: Two times a day (BID) | ORAL | Status: DC
  Administered 2017-10-06 – 2017-10-18 (×25): 25 mg via ORAL
  Filled 2017-10-06 (×25): qty 1

## 2017-10-06 MED ORDER — HYDROCODONE-ACETAMINOPHEN 5-325 MG PO TABS
1.0000 | ORAL_TABLET | ORAL | Status: DC | PRN
  Administered 2017-10-06: 2 via ORAL
  Administered 2017-10-06: 1 via ORAL
  Administered 2017-10-07: 2 via ORAL
  Administered 2017-10-07: 1 via ORAL
  Administered 2017-10-08 – 2017-10-11 (×8): 2 via ORAL
  Filled 2017-10-06 (×6): qty 2
  Filled 2017-10-06: qty 1
  Filled 2017-10-06: qty 2
  Filled 2017-10-06: qty 1
  Filled 2017-10-06 (×3): qty 2

## 2017-10-06 MED ORDER — ESOMEPRAZOLE MAGNESIUM 20 MG PO CPDR
40.0000 mg | DELAYED_RELEASE_CAPSULE | Freq: Every day | ORAL | Status: DC
  Administered 2017-10-06: 40 mg via ORAL
  Filled 2017-10-06: qty 2

## 2017-10-06 MED ORDER — DILTIAZEM HCL ER COATED BEADS 240 MG PO CP24
240.0000 mg | ORAL_CAPSULE | Freq: Every day | ORAL | Status: DC
  Administered 2017-10-06 – 2017-10-18 (×13): 240 mg via ORAL
  Filled 2017-10-06 (×13): qty 1

## 2017-10-06 MED ORDER — INSULIN GLARGINE 100 UNIT/ML ~~LOC~~ SOLN
15.0000 [IU] | Freq: Every day | SUBCUTANEOUS | Status: DC
  Administered 2017-10-06 – 2017-10-10 (×5): 15 [IU] via SUBCUTANEOUS
  Filled 2017-10-06 (×6): qty 0.15

## 2017-10-06 MED ORDER — GLUCERNA SHAKE PO LIQD
237.0000 mL | Freq: Two times a day (BID) | ORAL | Status: DC
  Administered 2017-10-06 – 2017-10-16 (×15): 237 mL via ORAL
  Filled 2017-10-06 (×6): qty 237

## 2017-10-06 MED ORDER — ALPRAZOLAM 0.5 MG PO TABS
0.5000 mg | ORAL_TABLET | Freq: Two times a day (BID) | ORAL | Status: DC | PRN
  Administered 2017-10-06 – 2017-10-17 (×18): 0.5 mg via ORAL
  Filled 2017-10-06 (×18): qty 1

## 2017-10-06 MED ORDER — ONDANSETRON HCL 4 MG PO TABS: 4.0000 mg | ORAL_TABLET | Freq: Four times a day (QID) | ORAL | Status: DC | PRN

## 2017-10-06 MED ORDER — METHYLPREDNISOLONE SODIUM SUCC 125 MG IJ SOLR
60.0000 mg | Freq: Two times a day (BID) | INTRAMUSCULAR | Status: DC
  Administered 2017-10-06: 60 mg via INTRAVENOUS
  Filled 2017-10-06: qty 2

## 2017-10-06 MED ORDER — LEVALBUTEROL HCL 1.25 MG/0.5ML IN NEBU: 1.2500 mg | INHALATION_SOLUTION | RESPIRATORY_TRACT | Status: DC | PRN

## 2017-10-06 MED ORDER — PREDNISONE 20 MG PO TABS: 40.0000 mg | ORAL_TABLET | Freq: Every day | ORAL | Status: DC

## 2017-10-06 MED ORDER — ALBUTEROL SULFATE (2.5 MG/3ML) 0.083% IN NEBU
2.5000 mg | INHALATION_SOLUTION | Freq: Four times a day (QID) | RESPIRATORY_TRACT | Status: DC
  Administered 2017-10-06: 2.5 mg via RESPIRATORY_TRACT
  Filled 2017-10-06: qty 3

## 2017-10-06 MED ORDER — IPRATROPIUM-ALBUTEROL 0.5-2.5 (3) MG/3ML IN SOLN: 3.0000 mL | Freq: Four times a day (QID) | RESPIRATORY_TRACT | Status: DC

## 2017-10-06 NOTE — Progress Notes (Signed)
Initial Nutrition Assessment  DOCUMENTATION CODES:   Obesity unspecified  INTERVENTION:   Glucerna Shake po TID, each supplement provides 220 kcal and 10 grams of protein  NUTRITION DIAGNOSIS:   Inadequate oral intake related to acute illness as evidenced by per patient/family report.  GOAL:   Patient will meet greater than or equal to 90% of their needs  MONITOR:   PO intake, Supplement acceptance, Labs, Weight trends, I & O's  REASON FOR ASSESSMENT:   Consult COPD Protocol  ASSESSMENT:   67 y.o. female with medical history significant of asthma type 2 diabetes, hypertension, hyperlipidemia, pulmonary emboli and, depression, , coronary artery disease, stent placement following a heart attack in January 18 2016, seizure disorder with occasional episodes of confusion, chronic diastolic CHF, HLD, GERD, HTN. Pt admitted for acute bronchitis and asthma exacerbation    Met with pt in room today. Pt reports increased appetite and oral intake along with ~10lb wt gain pta r/t prednisone. Pt reports fair appetite and oral intake today; pt reports eating 50% of her breakfast. RD discussed with pt the importance of adequate protein and calorie intake needed to preserve lean muscle with COPD. Pt likes butter pecan flavosr; will add Glucerna. Recommend close monitoring of weights at home and begin supplements if weights start to decrease.   Medications reviewed and include: aspirin, azithromycin, lovenox, nexium, pepcid, insulin, solu-medrol    Labs reviewed: K 3.5 wnl, BUN 23(H), creat 1.30(H), P 2.9 wnl, Mg 1.7 wnl Hgb 10.2(L), Hct 32.0(L) cbgs- 337, 331, 334, 259 x 24 hrs AIC 7.3(H)- 6/8  NUTRITION - FOCUSED PHYSICAL EXAM:    Most Recent Value  Orbital Region  No depletion  Upper Arm Region  No depletion  Thoracic and Lumbar Region  No depletion  Buccal Region  No depletion  Temple Region  No depletion  Clavicle Bone Region  No depletion  Clavicle and Acromion Bone Region  No  depletion  Scapular Bone Region  No depletion  Dorsal Hand  No depletion  Patellar Region  No depletion  Anterior Thigh Region  No depletion  Posterior Calf Region  No depletion  Edema (RD Assessment)  None  Hair  Reviewed  Eyes  Reviewed  Mouth  Reviewed  Skin  Reviewed  Nails  Reviewed      Diet Order:   Diet Order           Diet Carb Modified Fluid consistency: Thin; Room service appropriate? Yes  Diet effective now         EDUCATION NEEDS:   Education needs have been addressed  Skin:  Skin Assessment: Reviewed RN Assessment  Last BM:  pta  Height:   Ht Readings from Last 1 Encounters:  10/06/17 5' 2"  (1.575 m)    Weight:   Wt Readings from Last 1 Encounters:  10/06/17 166 lb 1.6 oz (75.3 kg)    Ideal Body Weight:  50 kg  BMI:  Body mass index is 30.38 kg/m.  Estimated Nutritional Needs:   Kcal:  1500-1700kcal/day   Protein:  75-90g/day   Fluid:  >1.5L/day   Koleen Distance MS, RD, LDN Pager #- [phone] Office#- [phone] After Hours Pager: [phone]

## 2017-10-06 NOTE — Consult Note (Addendum)
Initial Pulmonary/Critical Care Consultation  Patient Name: [name] MRN: [id number] DOB: January 18, 1951    ADMISSION DATE:  10/05/2017 CONSULTATION DATE:  10/06/2017  REFERRING MD:  Dr. Toy Baker  REASON FOR CONSULTATION:  Asthma exacerbation   HISTORY OF PRESENT ILLNESS  This 67 y.o. African American female is seen in consultation at the request of Dr. Toy Baker for recommendations on further evaluation and management of asthma exacerbation. She is seen on 4E, where the patient has been admitted from the ER. She states that she already feels significantly improved when compared to her initial presentation earlier tonight.  The patient was recently hospitalized in April 2019 with asthma exacerbation and has been home for about 6 weeks, during which she reports that she was doing well until a couple of days ago, when she noticed that she was developing increased dyspnea and audible wheezing. No reported increase in cough or sputum production. She does have a known sick contact, great grandchild who is apparently ill with a respiratory viral illness. She denies fever or chills. She denies chest pain. She has noted that she has had an overall increase in her weight and lower extremity edema. She denies paroxysmal nocturnal dyspnea but does endorse mild orthopnea. She weighs herself daily and believes that 161-165 lbs is her normal range, and then reports that she was 166 lbs yesterday morning.  REVIEW OF SYSTEMS As highlighted above and in the HPI. Otherwise, the remainder of the balance of a review of 13 systems is negative.  PAST MEDICAL/SURGICAL/SOCIAL/FAMILY HISTORIES   Past Medical History:  Diagnosis Date  . Anxiety   . Asthma   . Bronchitis   . Chronic diastolic CHF (congestive heart failure) ([place])   . Chronic diastolic CHF (congestive heart failure) ([place]) 07/17/2016  . Coronary artery disease    a. NSTEMI 12/2015 - LHC 01/18/16: s/p overlapping DESx2 to RCA, 10%  ost-prox Cx,10% mLAD.  Marland Kitchen Depression   . Diabetes mellitus   . Dyslipidemia   . GERD (gastroesophageal reflux disease)   . Heart attack ([place])   . Hypertension   . Hypertensive heart disease   . NSTEMI (non-ST elevated myocardial infarction) ([place]) 12/2015  . Pneumonia     Past Surgical History:  Procedure Laterality Date  . ABDOMINAL HYSTERECTOMY    . CARDIAC CATHETERIZATION  1994   minimal LAD dz, no other dz, EF normal  . CARDIAC CATHETERIZATION N/A 01/18/2016   Procedure: Left Heart Cath and Coronary Angiography;  Surgeon: Burnell Blanks, MD;  Location: [facility];  Service: Cardiovascular;  Laterality: N/A;  . CARDIAC CATHETERIZATION N/A 01/18/2016   Procedure: Coronary Stent Intervention;  Surgeon: Burnell Blanks, MD;  Location: [facility];  Service: Cardiovascular;  Laterality: N/A;  . COLONOSCOPY WITH PROPOFOL N/A 04/09/2014   Procedure: COLONOSCOPY WITH PROPOFOL;  Surgeon: Cleotis Nipper, MD;  Location: [facility];  Service: Endoscopy;  Laterality: N/A;  . CORONARY STENT PLACEMENT  01/19/2016   STENT SYNERGY DES 6.14E31 drug eluting stent was successfully placed, and overlaps the 2.5 x 38 mm Synergy stent placed distally.  . ESOPHAGOGASTRODUODENOSCOPY (EGD) WITH PROPOFOL N/A 04/09/2014   Procedure: ESOPHAGOGASTRODUODENOSCOPY (EGD) WITH PROPOFOL;  Surgeon: Cleotis Nipper, MD;  Location: [facility];  Service: Endoscopy;  Laterality: N/A;  . HEMORRHOID SURGERY    . HERNIA REPAIR    . KNEE ARTHROSCOPY      Social History   Tobacco Use  . Smoking status: Former Smoker    Packs/day: 1.00  Years: 30.00    Pack years: 30.00    Types: Cigarettes    Last attempt to quit: 01/09/2004    Years since quitting: 13.7  . Smokeless tobacco: Never Used  Substance Use Topics  . Alcohol use: No    Alcohol/week: 0.0 oz    Family History  Problem Relation Age of Onset  . Allergies Mother   . Asthma Mother   . CAD Father 66  . Diabetes Father    . Hypertension Father   . Congestive Heart Failure Sister        died in her 44s  . CAD Sister 68    Allergies  Allergen Reactions  . Amlodipine Besylate Other (See Comments)    Reaction unknown  . Lisinopril Other (See Comments)    Reaction unknown  . Pantoprazole Sodium Other (See Comments)    Reaction unknown    Prior to Admission medications   Medication Sig Start Date End Date Taking? Authorizing Provider  albuterol (PROAIR HFA) 108 (90 Base) MCG/ACT inhaler Inhale 2 puffs into the lungs every 4 (four) hours as needed for wheezing or shortness of breath. 06/29/17  Yes Hayden Rasmussen, MD  albuterol (PROVENTIL) (2.5 MG/3ML) 0.083% nebulizer solution Take 3 mLs (2.5 mg total) by nebulization every 4 (four) hours as needed for shortness of breath. 07/31/15  Yes Theodis Blaze, MD  ALPRAZolam Duanne Moron) 0.5 MG tablet Take 1 tablet (0.5 mg total) by mouth 2 (two) times daily as needed for anxiety. 07/31/15  Yes Theodis Blaze, MD  aspirin EC 81 MG tablet Take 81 mg by mouth daily.    Yes [provider]  atorvastatin (LIPITOR) 40 MG tablet Take 40 mg by mouth daily.   Yes [provider]  Biotin 5000 MCG CAPS Take 1 capsule by mouth daily.   Yes [provider]  budesonide-formoterol (SYMBICORT) 80-4.5 MCG/ACT inhaler Inhale 2 puffs into the lungs 2 (two) times daily. 09/18/17  Yes Tanda Rockers, MD  diltiazem (CARDIZEM CD) 240 MG 24 hr capsule Take 1 capsule (240 mg total) by mouth daily. 09/17/17  Yes Burtis Junes, NP  divalproex (DEPAKOTE ER) 500 MG 24 hr tablet Take 2 tablets (1,000 mg total) by mouth at bedtime. 05/29/17  Yes Dennie Bible, NP  esomeprazole (NEXIUM) 40 MG capsule Take 1 capsule (40 mg total) by mouth 2 (two) times daily before a meal. Take twice a day for 4days then resume once daily Patient taking differently: Take 40 mg by mouth daily at 12 noon. Take twice a day for 4days then resume once daily 07/21/16  Yes Domenic Polite, MD    estradiol (ESTRACE) 1 MG tablet Take 1 mg by mouth daily.    Yes [provider]  hydrALAZINE (APRESOLINE) 25 MG tablet Take 1 tablet (25 mg total) by mouth 2 (two) times daily. 09/17/17  Yes Burtis Junes, NP  irbesartan (AVAPRO) 300 MG tablet Take 300 mg by mouth daily.  02/18/17  Yes [provider]  loratadine (CLARITIN) 10 MG tablet Take 1 tablet (10 mg total) by mouth daily. 08/18/17  Yes Geradine Girt, DO  metFORMIN (GLUCOPHAGE) 500 MG tablet Take 2 tablets (1,000 mg total) by mouth 2 (two) times daily with a meal. Patient taking differently: Take 500 mg by mouth 2 (two) times daily with a meal.  08/17/17  Yes Vann, Jessica U, DO  nebivolol (BYSTOLIC) 5 MG tablet Take 5 mg by mouth daily.   Yes [provider]  nitroGLYCERIN (NITROSTAT) 0.4 MG SL tablet Place 1 tablet (0.4 mg total) under the tongue every 5 (five) minutes as needed for chest pain. x3 as needed 09/17/17  Yes Gerhardt, Marlane Hatcher, NP  potassium chloride SA (K-DUR,KLOR-CON) 20 MEQ tablet Take 20 mEq by mouth daily.    Yes [provider]  ranitidine (ZANTAC) 150 MG capsule Take 150 mg by mouth every evening.   Yes [provider]  torsemide (DEMADEX) 20 MG tablet Take 20-40 mg by mouth daily. May take an extra tablet if needed. 10/25/16  Yes [provider]  predniSONE (DELTASONE) 10 MG tablet Take  4 each am x 2 days,   2 each am x 2 days,  1 each am x 2 days and stop Patient not taking: Reported on 10/05/2017 09/18/17   Tanda Rockers, MD    Current Facility-Administered Medications  Medication Dose Route Frequency Provider Last Rate Last Dose  . 0.9 %  sodium chloride infusion   Intravenous Continuous Toy Baker, MD 50 mL/hr at 10/06/17 0243    . acetaminophen (TYLENOL) tablet 650 mg  650 mg Oral Q6H PRN Toy Baker, MD       Or  . acetaminophen (TYLENOL) suppository 650 mg  650 mg Rectal Q6H PRN Doutova, Anastassia, MD      . albuterol  (PROVENTIL,VENTOLIN) solution continuous neb  7.5 mg/hr Nebulization Continuous Doutova, Anastassia, MD 1.5 mL/hr at 10/06/17 0114 7.5 mg/hr at 10/06/17 0114  . ALPRAZolam (XANAX) tablet 0.5 mg  0.5 mg Oral BID PRN Toy Baker, MD      . aspirin EC tablet 81 mg  81 mg Oral Daily Doutova, Anastassia, MD      . atorvastatin (LIPITOR) tablet 40 mg  40 mg Oral Daily Doutova, Anastassia, MD      . diltiazem (CARDIZEM CD) 24 hr capsule 240 mg  240 mg Oral Daily Doutova, Anastassia, MD      . divalproex (DEPAKOTE ER) 24 hr tablet 1,000 mg  1,000 mg Oral QHS Doutova, Anastassia, MD      . enoxaparin (LOVENOX) injection 40 mg  40 mg Subcutaneous Q24H Doutova, Anastassia, MD      . esomeprazole (NEXIUM) capsule 40 mg  40 mg Oral Daily Doutova, Anastassia, MD      . famotidine (PEPCID) tablet 10 mg  10 mg Oral Daily Doutova, Anastassia, MD      . guaiFENesin (MUCINEX) 12 hr tablet 600 mg  600 mg Oral BID Toy Baker, MD   600 mg at 10/06/17 0316  . hydrALAZINE (APRESOLINE) tablet 25 mg  25 mg Oral BID Doutova, Anastassia, MD      . HYDROcodone-acetaminophen (NORCO/VICODIN) 5-325 MG per tablet 1-2 tablet  1-2 tablet Oral Q4H PRN Doutova, Anastassia, MD      . insulin aspart (novoLOG) injection 0-15 Units  0-15 Units Subcutaneous TID WC Doutova, Anastassia, MD      . insulin aspart (novoLOG) injection 0-5 Units  0-5 Units Subcutaneous QHS Toy Baker, MD   4 Units at 10/06/17 0235  . ipratropium-albuterol (DUONEB) 0.5-2.5 (3) MG/3ML nebulizer solution 3 mL  3 mL Nebulization QID Doutova, Anastassia, MD      . irbesartan (AVAPRO) tablet 300 mg  300 mg Oral Daily Doutova, Anastassia, MD      . levalbuterol (XOPENEX) nebulizer solution 1.25 mg  1.25 mg Nebulization Q1H PRN Doutova, Anastassia, MD      . loratadine (CLARITIN) tablet 10 mg  10 mg Oral Daily Toy Baker, MD      .  methylPREDNISolone sodium succinate (SOLU-MEDROL) 125 mg/2 mL injection 60 mg  60 mg Intravenous BID  Toy Baker, MD   60 mg at 10/06/17 0234   Followed by  . predniSONE (DELTASONE) tablet 40 mg  40 mg Oral Q supper Doutova, Anastassia, MD      . nebivolol (BYSTOLIC) tablet 5 mg  5 mg Oral Daily Doutova, Anastassia, MD      . ondansetron (ZOFRAN) tablet 4 mg  4 mg Oral Q6H PRN Doutova, Anastassia, MD       Or  . ondansetron (ZOFRAN) injection 4 mg  4 mg Intravenous Q6H PRN Doutova, Anastassia, MD        VITAL SIGNS: BP (!) 154/74 (BP Location: Left Arm)   Pulse (!) 101   Temp (!) 97.5 F (36.4 C) (Axillary)   Resp (!) 25   Ht 5\' 2"  (1.575 m)   Wt 75.3 kg (166 lb 1.6 oz)   LMP  (LMP Unknown)   SpO2 97%   BMI 30.38 kg/m   INTAKE / OUTPUT: No intake/output data recorded.  PHYSICAL EXAMINATION: GENERAL: Pleasant. Well-developed. Alert, awake and oriented to time, person and place. Cooperative. No acute distress. Audibly wheezing. HEAD: normocephalic, atraumatic EYE: PERRLA, EOM intact, no scleral icterus, no pallor. NOSE: nares are patent. No polyps. No exudate. THROAT/ORAL CAVITY: Normal dentition. No oral thrush. No exudate. Mucous membranes are moist. No tonsillar enlargement. Mallampati class III (soft and hard palate and base of uvula visible) airway. NECK: supple, no thyromegaly, no JVD, no lymphadenopathy. Trachea midline. CHEST/LUNG: symmetric in development and expansion. Diminished air entry. Bilateral crackles and high-pitched wheezes. HEART: Regular S1 and S2 without murmur, rub or gallop. ABDOMEN: soft, nontender, nondistended. Normoactive bowel sounds. No rebound. No guarding. No hepatosplenomegaly. EXTREMITIES: Edema: 2+. No cyanosis. No clubbing. 2+ DP pulses LYMPHATIC: no cervical/axiallary/inguinal lymph nodes appreciated MUSCULOSKELETAL: No point tenderness. No bulk atrophy. Joints: normal. SKIN:  No rash or lesion. NEUROLOGIC: Doll's eyes intact. Corneal reflex intact. Spontaneous respirations intact. Cranial nerves II-XII are grossly symmetric and  physiologic. Babinski absent. No sensory deficit. Motor: 5/5 @ RUE, 5/5 @ LUE, 5/5 @ RLL,  5/5 @ LLL.  DTR: 2+ @ R biceps, 2+ @ L biceps, 2+ @ R patellar,  2+ @ L patellar. No cerebellar signs. Gait was not assessed.   LABS:  BASIC METABOLIC PROFILE Recent Labs  Lab 10/05/17 1625  NA 142  K 3.9  CL 106  CO2 29  BUN 18  CREATININE 1.01*  GLUCOSE 116*  CALCIUM 9.1   Glucose Recent Labs  Lab 10/06/17 0211  GLUCAP 337*   CBC Recent Labs  Lab 10/05/17 1625  WBC 7.2  HGB 10.2*  HCT 32.7*  PLT 265   CULTURES: Results for orders placed or performed during the hospital encounter of 07/17/16  Culture, blood (routine x 2) Call MD if unable to obtain prior to antibiotics being given     Status: None   Collection Time: 07/17/16  8:35 PM  Result Value Ref Range Status   Specimen Description BLOOD RIGHT ARM  Final   Special Requests BOTTLES DRAWN AEROBIC ONLY 10CC  Final   Culture NO GROWTH 5 DAYS  Final   Report Status 07/22/2016 FINAL  Final  Culture, blood (routine x 2) Call MD if unable to obtain prior to antibiotics being given     Status: None   Collection Time: 07/17/16  8:47 PM  Result Value Ref Range Status   Specimen Description BLOOD LEFT ARM  Final  Special Requests IN PEDIATRIC BOTTLE 4CC  Final   Culture NO GROWTH 5 DAYS  Final   Report Status 07/22/2016 FINAL  Final  Culture, sputum-assessment     Status: None   Collection Time: 07/19/16  1:16 AM  Result Value Ref Range Status   Specimen Description EXPECTORATED SPUTUM  Final   Special Requests NONE  Final   Sputum evaluation THIS SPECIMEN IS ACCEPTABLE FOR SPUTUM CULTURE  Final   Report Status 07/19/2016 FINAL  Final  Culture, respiratory (NON-Expectorated)     Status: None   Collection Time: 07/19/16  1:16 AM  Result Value Ref Range Status   Specimen Description EXPECTORATED SPUTUM  Final   Special Requests NONE Reflexed from [id number]  Final   Gram Stain   Final    FEW SQUAMOUS EPITHELIAL CELLS  PRESENT FEW WBC PRESENT, PREDOMINANTLY MONONUCLEAR FEW GRAM POSITIVE COCCI IN PAIRS    Culture Consistent with normal respiratory flora.  Final   Report Status 07/21/2016 FINAL  Final    IMAGING: Dg Chest 2 View  Result Date: 10/05/2017 CLINICAL DATA:  Asthma exacerbation. Cough for 2 days. Hypertension. Ex-smoker. EXAM: CHEST - 2 VIEW COMPARISON:  08/12/2017 FINDINGS: Midline trachea. Mild cardiomegaly. Tortuous thoracic aorta. No pleural effusion or pneumothorax. Clear lungs. IMPRESSION: Mild cardiomegaly, without acute disease. Electronically Signed   By: Abigail Miyamoto M.D.   On: 10/05/2017 15:59   ANTIBIOTICS: N/A  SIGNIFICANT EVENTS: 6/8: admitted with asthma exacerbation   ASSESSMENT / PLAN: Principal Problem:   Asthma exacerbation Active Problems:   Essential hypertension   Diabetes mellitus type 2, controlled ([place])   Acute bronchitis with asthma with acute exacerbation   Chronic diastolic CHF (congestive heart failure) ([facility])   Depression with anxiety   G E R D   PULMONARY EMBOLISM, HX OF   Macrocytic anemia   PULMONARY  ASTHMA EXACERBATION Albuterol/Pulmicort for now Continue SoluMedrol Azithromycin 500 mg PO daily x 3 doses.  CARDIOVASCULAR  DIASTOLIC DYSFUNCTION  HYPERTENSIVE HEART DISEASE  ESSENTIAL HYPERTENSION Furosemide 40 mg IV q 12 hr x 4 doses. Target weight: 161 lbs. Anti-hypertensive regimen per attending physician  ENDOCRINE  TYPE 2 DIABETES MELLITUS Hold metformin Sliding scale insulin for now  GASTROINTESTINAL  GERD, requiring long-term PPI therapy (Protonix intolerance noted; patient takes Nexium at home) GI prophylaxis: Nexium/famotidine  HEMATOLOGIC  DVT prophylaxis: Lovenox   Renee Pain, MD Board Certified by the ABIM, [place] Pager: [phone]  10/06/2017, 3:26 AM

## 2017-10-06 NOTE — H&P (Signed)
[name]:[id number] DOB: 07/06/1950 DOA: 10/05/2017     PCP: [name], MD   Outpatient Specialists:     CARDS: Dr. Marlou Porch   NEurology Dr.Martin NP Pulmonary Dr. Melvyn Novas   Patient arrived to ER on 10/05/17 at 1454  Patient coming from:   home Lives  With family    Chief Complaint:  Chief Complaint  Patient presents with  . Asthma    HPI: [name] is a 67 y.o. female with medical history significant of asthma type 2 diabetes, hypertension, hyperlipidemia, pulmonary emboli and, depression, , coronary artery disease, stent placement following a heart attack in January 18 2016, seizure disorder with occasional episodes of confusion, chronic diastolic CHF, HLD, GERD, HTN     Presented with 2-day history of worsening shortness of breath, runny nose she had a granddaughter recently who had URI type of symptoms.  Has been trying to use Robitussin to help with her cough.   And albuterol nebulizer without help Cough is nonproductive.  She is reports chest tightness but no chest pain unless she is coughing no associated nausea vomiting diaphoresis or syncope increased dyspnea on exertion. She called into her pulmonologist who called in prescription for prednisone. No fever.  Regarding pertinent Chronic problems: Echogram January 18 2016, ejection fraction 65-70%, severe concentric hypertrophy, septal wall thickness was increased, with mild hypertrophy of the posterior wall, abnormal relaxation, increased filling pressure,    While in ER: Treated for asthma exacerbation in ER but feels very short of breath with ambulation Prior episodes of the same  Following Medications were ordered in ER: Medications  albuterol (PROVENTIL) (2.5 MG/3ML) 0.083% nebulizer solution 5 mg (5 mg Nebulization Given 10/05/17 1528)  ipratropium-albuterol (DUONEB) 0.5-2.5 (3) MG/3ML nebulizer solution 3 mL (3 mLs Nebulization Given 10/05/17 1723)  methylPREDNISolone sodium succinate (SOLU-MEDROL)  125 mg/2 mL injection 125 mg (125 mg Intravenous Given 10/05/17 1820)  magnesium sulfate IVPB 2 g 50 mL (0 g Intravenous Stopped 10/05/17 1947)  ipratropium-albuterol (DUONEB) 0.5-2.5 (3) MG/3ML nebulizer solution 3 mL (3 mLs Nebulization Given 10/05/17 2339)    Significant initial  Findings: Abnormal Labs Reviewed  BASIC METABOLIC PANEL - Abnormal; Notable for the following components:      Result Value   Glucose, Bld 116 (*)    Creatinine, Ser 1.01 (*)    GFR calc non Af Amer 56 (*)    All other components within normal limits  CBC WITH DIFFERENTIAL/PLATELET - Abnormal; Notable for the following components:   RBC 3.27 (*)    Hemoglobin 10.2 (*)    HCT 32.7 (*)    Monocytes Absolute 1.1 (*)    All other components within normal limits     Na 142 K 3.9  Cr stable,  Lab Results  Component Value Date   CREATININE 1.01 (H) 10/05/2017   CREATININE 1.14 (H) 09/17/2017   CREATININE 1.21 (H) 08/16/2017      WBC 7.2  HG/HCT stable,      Component Value Date/Time   HGB 10.2 (L) 10/05/2017 1625   HGB 11.2 09/17/2017 1550   HCT 32.7 (L) 10/05/2017 1625   HCT 34.2 09/17/2017 1550    Troponin (Point of Care Test) Recent Labs    10/05/17 1825  TROPIPOC 0.00     BNP (last 3 results) Recent Labs    08/12/17 0544 10/05/17 1625  BNP 46.4 30.0    ProBNP (last 3 results) No results for input(s): PROBNP in the last 8760 hours.  Lactic  Acid, Venous No results found for: LATICACIDVEN    UA  not ordered  CXR -  NON acute   ECG:  Personally reviewed by me showing: HR : 85 Rhythm:  NSR,    no evidence of ischemic changes QTC 425     ED Triage Vitals  Enc Vitals Group     BP 10/05/17 1522 (!) 152/93     Pulse Rate 10/05/17 1522 83     Resp 10/05/17 1522 (!) 22     Temp 10/05/17 1522 97.9 F (36.6 C)     Temp Source 10/05/17 1522 Oral     SpO2 10/05/17 1522 98 %     Weight 10/05/17 1523 165 lb (74.8 kg)     Height 10/05/17 1523 5\' 2"  (1.575 m)     Head  Circumference --      Peak Flow --      Pain Score 10/05/17 1523 0     Pain Loc --      Pain Edu? --      Excl. in [place]? --   TMAX(24)@       Latest  Blood pressure (!) 168/85, pulse 81, temperature 98 F (36.7 C), temperature source Oral, resp. rate 20, height 5\' 2"  (1.575 m), weight 74.8 kg (165 lb), SpO2 99 %.      Hospitalist was called for admission for asthma exacerbation    Review of Systems:    Pertinent positives include: shortness of breath at rest. dyspnea on exertion,  non-productive cough, wheezing.  Constitutional:  No weight loss, night sweats, Fevers, chills, fatigue, weight loss  HEENT:  No headaches, Difficulty swallowing,Tooth/dental problems,Sore throat,  No sneezing, itching, ear ache, nasal congestion, post nasal drip,  Cardio-vascular:  No chest pain, Orthopnea, PND, anasarca, dizziness, palpitations.no Bilateral lower extremity swelling  GI:  No heartburn, indigestion, abdominal pain, nausea, vomiting, diarrhea, change in bowel habits, loss of appetite, melena, blood in stool, hematemesis Resp:   No excess mucus, no productive cough, No No coughing up of blood. No change in color of mucus  Skin:  no rash or lesions. No jaundice GU:  no dysuria, change in color of urine, no urgency or frequency. No straining to urinate.  No flank pain.  Musculoskeletal:  No joint pain or no joint swelling. No decreased range of motion. No back pain.  Psych:  No change in mood or affect. No depression or anxiety. No memory loss.  Neuro: no localizing neurological complaints, no tingling, no weakness, no double vision, no gait abnormality, no slurred speech, no confusion  As per HPI otherwise 10 point review of systems negative.   Past Medical History:   Past Medical History:  Diagnosis Date  . Anxiety   . Asthma   . Bronchitis   . Chronic diastolic CHF (congestive heart failure) ([place])   . Chronic diastolic CHF (congestive heart failure) ([place]) 07/17/2016  .  Coronary artery disease    a. NSTEMI 12/2015 - LHC 01/18/16: s/p overlapping DESx2 to RCA, 10% ost-prox Cx,10% mLAD.  Marland Kitchen Depression   . Diabetes mellitus   . Dyslipidemia   . GERD (gastroesophageal reflux disease)   . Heart attack ([place])   . Hypertension   . Hypertensive heart disease   . NSTEMI (non-ST elevated myocardial infarction) ([place]) 12/2015  . Pneumonia       Past Surgical History:  Procedure Laterality Date  . ABDOMINAL HYSTERECTOMY    . CARDIAC CATHETERIZATION  1994   minimal LAD dz, no other dz,  EF normal  . CARDIAC CATHETERIZATION N/A 01/18/2016   Procedure: Left Heart Cath and Coronary Angiography;  Surgeon: Burnell Blanks, MD;  Location: [facility];  Service: Cardiovascular;  Laterality: N/A;  . CARDIAC CATHETERIZATION N/A 01/18/2016   Procedure: Coronary Stent Intervention;  Surgeon: Burnell Blanks, MD;  Location: [facility];  Service: Cardiovascular;  Laterality: N/A;  . COLONOSCOPY WITH PROPOFOL N/A 04/09/2014   Procedure: COLONOSCOPY WITH PROPOFOL;  Surgeon: Cleotis Nipper, MD;  Location: [facility];  Service: Endoscopy;  Laterality: N/A;  . CORONARY STENT PLACEMENT  01/19/2016   STENT SYNERGY DES 7.86V67 drug eluting stent was successfully placed, and overlaps the 2.5 x 38 mm Synergy stent placed distally.  . ESOPHAGOGASTRODUODENOSCOPY (EGD) WITH PROPOFOL N/A 04/09/2014   Procedure: ESOPHAGOGASTRODUODENOSCOPY (EGD) WITH PROPOFOL;  Surgeon: Cleotis Nipper, MD;  Location: [facility];  Service: Endoscopy;  Laterality: N/A;  . HEMORRHOID SURGERY    . HERNIA REPAIR    . KNEE ARTHROSCOPY      Social History:  Ambulatory  independently       reports that she quit smoking about 13 years ago. Her smoking use included cigarettes. She has a 30.00 pack-year smoking history. She has never used smokeless tobacco. She reports that she does not drink alcohol or use drugs.     Family History:   Family History  Problem Relation Age of  Onset  . Allergies Mother   . Asthma Mother   . CAD Father 58  . Diabetes Father   . Hypertension Father   . Congestive Heart Failure Sister        died in her 40s  . CAD Sister 57    Allergies: Allergies  Allergen Reactions  . Amlodipine Besylate Other (See Comments)    Reaction unknown  . Lisinopril Other (See Comments)    Reaction unknown  . Pantoprazole Sodium Other (See Comments)    Reaction unknown     Prior to Admission medications   Medication Sig Start Date End Date Taking? Authorizing Provider  albuterol (PROAIR HFA) 108 (90 Base) MCG/ACT inhaler Inhale 2 puffs into the lungs every 4 (four) hours as needed for wheezing or shortness of breath. 06/29/17  Yes Hayden Rasmussen, MD  albuterol (PROVENTIL) (2.5 MG/3ML) 0.083% nebulizer solution Take 3 mLs (2.5 mg total) by nebulization every 4 (four) hours as needed for shortness of breath. 07/31/15  Yes Theodis Blaze, MD  ALPRAZolam Duanne Moron) 0.5 MG tablet Take 1 tablet (0.5 mg total) by mouth 2 (two) times daily as needed for anxiety. 07/31/15  Yes Theodis Blaze, MD  aspirin EC 81 MG tablet Take 81 mg by mouth daily.    Yes [provider]  atorvastatin (LIPITOR) 40 MG tablet Take 40 mg by mouth daily.   Yes [provider]  Biotin 5000 MCG CAPS Take 1 capsule by mouth daily.   Yes [provider]  budesonide-formoterol (SYMBICORT) 80-4.5 MCG/ACT inhaler Inhale 2 puffs into the lungs 2 (two) times daily. 09/18/17  Yes Tanda Rockers, MD  diltiazem (CARDIZEM CD) 240 MG 24 hr capsule Take 1 capsule (240 mg total) by mouth daily. 09/17/17  Yes Burtis Junes, NP  divalproex (DEPAKOTE ER) 500 MG 24 hr tablet Take 2 tablets (1,000 mg total) by mouth at bedtime. 05/29/17  Yes Dennie Bible, NP  esomeprazole (NEXIUM) 40 MG capsule Take 1 capsule (40 mg total) by mouth 2 (two) times daily before a meal. Take twice a  day for 4days then resume once daily Patient taking differently: Take 40 mg by mouth daily  at 12 noon. Take twice a day for 4days then resume once daily 07/21/16  Yes Domenic Polite, MD  estradiol (ESTRACE) 1 MG tablet Take 1 mg by mouth daily.    Yes [provider]  hydrALAZINE (APRESOLINE) 25 MG tablet Take 1 tablet (25 mg total) by mouth 2 (two) times daily. 09/17/17  Yes Burtis Junes, NP  irbesartan (AVAPRO) 300 MG tablet Take 300 mg by mouth daily.  02/18/17  Yes [provider]  loratadine (CLARITIN) 10 MG tablet Take 1 tablet (10 mg total) by mouth daily. 08/18/17  Yes Geradine Girt, DO  metFORMIN (GLUCOPHAGE) 500 MG tablet Take 2 tablets (1,000 mg total) by mouth 2 (two) times daily with a meal. Patient taking differently: Take 500 mg by mouth 2 (two) times daily with a meal.  08/17/17  Yes Vann, Jessica U, DO  nebivolol (BYSTOLIC) 5 MG tablet Take 5 mg by mouth daily.   Yes [provider]  nitroGLYCERIN (NITROSTAT) 0.4 MG SL tablet Place 1 tablet (0.4 mg total) under the tongue every 5 (five) minutes as needed for chest pain. x3 as needed 09/17/17  Yes Gerhardt, Marlane Hatcher, NP  potassium chloride SA (K-DUR,KLOR-CON) 20 MEQ tablet Take 20 mEq by mouth daily.    Yes [provider]  ranitidine (ZANTAC) 150 MG capsule Take 150 mg by mouth every evening.   Yes [provider]  torsemide (DEMADEX) 20 MG tablet Take 20-40 mg by mouth daily. May take an extra tablet if needed. 10/25/16  Yes [provider]  predniSONE (DELTASONE) 10 MG tablet Take  4 each am x 2 days,   2 each am x 2 days,  1 each am x 2 days and stop Patient not taking: Reported on 10/05/2017 09/18/17   Tanda Rockers, MD   Physical Exam: Blood pressure (!) 168/85, pulse 81, temperature 98 F (36.7 C), temperature source Oral, resp. rate 20, height 5\' 2"  (1.575 m), weight 74.8 kg (165 lb), SpO2 99 %. 1. General:  in No Acute distress but unable to speak in full sentences without audible wheezing Short of breath -appearing 2. Psychological: Alert and   Oriented 3.  Head/ENT:   Dry Mucous Membranes                          Head Non traumatic, neck supple                           Poor Dentition 4. SKIN:   decreased Skin turgor,  Skin clean Dry and intact no rash 5. Heart: Regular rate and rhythm no  Murmur, no Rub or gallop 6. Lungs: Diminished air movement some wheezes or crackles   7. Abdomen: Soft, non-tender, Non distended obese  bowel sounds present 8. Lower extremities: no clubbing, cyanosis, or edema 9. Neurologically Grossly intact, moving all 4 extremities equally  10. MSK: Normal range of motion   LABS:     Recent Labs  Lab 10/05/17 1625  WBC 7.2  NEUTROABS 4.5  HGB 10.2*  HCT 32.7*  MCV 100.0  PLT 657   Basic Metabolic Panel: Recent Labs  Lab 10/05/17 1625  NA 142  K 3.9  CL 106  CO2 29  GLUCOSE 116*  BUN 18  CREATININE 1.01*  CALCIUM 9.1  Cultures:    Component Value Date/Time   SDES EXPECTORATED SPUTUM 07/19/2016 0116   SDES EXPECTORATED SPUTUM 07/19/2016 0116   SPECREQUEST NONE 07/19/2016 0116   SPECREQUEST NONE Reflexed from [id number] 07/19/2016 0116   CULT Consistent with normal respiratory flora. 07/19/2016 0116   REPTSTATUS 07/19/2016 FINAL 07/19/2016 0116   REPTSTATUS 07/21/2016 FINAL 07/19/2016 0116     Radiological Exams on Admission: Dg Chest 2 View  Result Date: 10/05/2017 CLINICAL DATA:  Asthma exacerbation. Cough for 2 days. Hypertension. Ex-smoker. EXAM: CHEST - 2 VIEW COMPARISON:  08/12/2017 FINDINGS: Midline trachea. Mild cardiomegaly. Tortuous thoracic aorta. No pleural effusion or pneumothorax. Clear lungs. IMPRESSION: Mild cardiomegaly, without acute disease. Electronically Signed   By: Abigail Miyamoto M.D.   On: 10/05/2017 15:59    Chart has been reviewed    Assessment/Plan   67 y.o. female with medical history significant of asthma type 2 diabetes, hypertension, hyperlipidemia, pulmonary emboli and, depression, COPD, hx of PE, coronary artery disease, stent placement following a heart  attack in January 18 2016, seizure disorder with occasional episodes of confusion, chronic diastolic CHF, HLD, GERD, HTN  Admitted for asthma exacerbation  Present on Admission: . Acute bronchitis with asthma with acute exacerbation -   Will initiate: Steroid taper  -  Antibiotics     XopenexPRN, - scheduled duoneb,  -  Breo or Dulera at discharge   -  Mucinex.  Titrate O2 to saturation >90%. Follow patients respiratory status.  Order respiratory panel      Currently mentating well no evidence of symptomatic hypercarbia  . Acute respiratory failure (HCC) secondary to asthma will admit and treat underlying process . Anemia -  chronic slightly down from baseline will check anemia panel   . CAD in native artery -  - chronic, continue aspirin and statin, betablocker   . Chest pain atypical but given history of CAD will cycle cardiac enzymes . Chronic diastolic CHF (congestive heart failure) ([place]) . Depression with anxiety  - stable continue home medications . Essential hypertension -stable continue medications . G E R D stable continue PPI . HLD (hyperlipidemia) stable continue statin DM2 - Order moderate  SSI   -  check TSH and HgA1C  - Hold by mouth medications   History of PE -  remote no longer on anticoagulation  history of seizure disorder - Continue home medications currently stable  Other plan as per orders.  DVT prophylaxis:   Lovenox     Code Status:   DNR/DNI  as per patient  I had personally discussed CODE STATUS with patient and family  Family Communication:   Family  at  Bedside  plan of care was discussed with  Son, Daughter in Sports coach,   Disposition Plan:    To home once workup is complete and patient is stable                       Would benefit from PT/OT eval prior to DC   ordered                                       Consults called: Notified pulmonology patient has been admitted  Admission status:    inpatient       Level of care      SDU         Colin Norment 10/06/2017, 1:47 AM  [facility]  Pager [phone]   after 2 AM please page floor coverage PA If 7AM-7PM, please contact the day team taking care of the patient  [email or web address]  Password [email or web address]

## 2017-10-06 NOTE — Progress Notes (Signed)
Received report form ED; Patient arrived on unit; Notified Physician

## 2017-10-06 NOTE — Evaluation (Signed)
Physical Therapy Evaluation Patient Details Name: [name] MRN: [id number] DOB: November 21, 1950 Today's Date: 10/06/2017   History of Present Illness  [name] is a 67 y.o. female with medical history significant of asthma type 2 diabetes, hypertension, hyperlipidemia, pulmonary emboli and, depression, , coronary artery disease, stent placement following a heart attack in January 18 2016, seizure disorder with occasional episodes of confusion, chronic diastolic CHF, HLD, GERD, HTN     Clinical Impression  Pt admitted with above diagnosis. Pt currently with functional limitations due to the deficits listed below (see PT Problem List). PTA, pt living with husband, working, independent with all mobility, does not wear home O2. Upon eval pt presenst with mild unsteadiness and fatigue, ambulating 120' on RA with SpO2 WNL. No overt LOB but mild unsteadiness noted likely due to weakness. Pt reports no concern for being back home. Pt will benefit from skilled PT to increase their independence and safety with mobility to allow discharge to the venue listed below.       Follow Up Recommendations No PT follow up;Supervision for mobility/OOB    Equipment Recommendations  None recommended by PT    Recommendations for Other Services       Precautions / Restrictions Precautions Precautions: Fall Precaution Comments: Watch O2 sats      Mobility  Bed Mobility Overal bed mobility: Modified Independent                Transfers Overall transfer level: Modified independent                  Ambulation/Gait Ambulation/Gait assistance: Supervision Ambulation Distance (Feet): 130 Feet Assistive device: None Gait Pattern/deviations: Step-to pattern;Step-through pattern Gait velocity: decreased   General Gait Details: Patient ambulating on RA, no AD, no overt LOB, mild unsteadiness noted. Pt requested to return to room due to fatigue.   Stairs            Wheelchair  Mobility    Modified Rankin (Stroke Patients Only)       Balance Overall balance assessment: Mild deficits observed, not formally tested                                           Pertinent Vitals/Pain      Home Living Family/patient expects to be discharged to:: Private residence Living Arrangements: Spouse/significant other;Other relatives Available Help at Discharge: Available 24 hours/day;Family Type of Home: House Home Access: Level entry     Home Layout: One level Home Equipment: Walker - 2 wheels      Prior Function Level of Independence: Independent         Comments: independent community ambulation, working, driving, independent with ADL     Hand Dominance        Extremity/Trunk Assessment   Upper Extremity Assessment Upper Extremity Assessment: Overall WFL for tasks assessed    Lower Extremity Assessment Lower Extremity Assessment: Overall WFL for tasks assessed       Communication   Communication: No difficulties  Cognition Arousal/Alertness: Awake/alert Behavior During Therapy: WFL for tasks assessed/performed Overall Cognitive Status: Within Functional Limits for tasks assessed                                        General Comments  Exercises     Assessment/Plan    PT Assessment Patient needs continued PT services  PT Problem List Decreased range of motion;Decreased strength;Decreased activity tolerance;Cardiopulmonary status limiting activity       PT Treatment Interventions DME instruction;Gait training;Stair training;Functional mobility training;Therapeutic activities;Therapeutic exercise    PT Goals (Current goals can be found in the Care Plan section)  Acute Rehab PT Goals Patient Stated Goal: feel better PT Goal Formulation: With patient Time For Goal Achievement: 10/20/17 Potential to Achieve Goals: Good    Frequency Min 3X/week   Barriers to discharge        Co-evaluation                AM-PAC PT "6 Clicks" Daily Activity  Outcome Measure Difficulty turning over in bed (including adjusting bedclothes, sheets and blankets)?: None Difficulty moving from lying on back to sitting on the side of the bed? : None Difficulty sitting down on and standing up from a chair with arms (e.g., wheelchair, bedside commode, etc,.)?: None Help needed moving to and from a bed to chair (including a wheelchair)?: None Help needed walking in hospital room?: None Help needed climbing 3-5 steps with a railing? : A Little 6 Click Score: 23    End of Session Equipment Utilized During Treatment: Gait belt;Oxygen(2.5L ) Activity Tolerance: Patient tolerated treatment well Patient left: in bed;with call bell/phone within reach Nurse Communication: Mobility status PT Visit Diagnosis: Unsteadiness on feet (R26.81);Other abnormalities of gait and mobility (R26.89)    Time: 9211-9417 PT Time Calculation (min) (ACUTE ONLY): 30 min   Charges:   PT Evaluation $PT Eval Low Complexity: 1 Low PT Treatments $Gait Training: 8-22 mins   PT G Codes:        Reinaldo Berber, PT, DPT Acute Rehab Services Pager: [phone]    Reinaldo Berber 10/06/2017, 12:30 PM

## 2017-10-06 NOTE — Progress Notes (Signed)
PROGRESS NOTE    [name]  YFV:[id number] DOB: May 23, 1950 DOA: 10/05/2017 PCP: [name], MD    Brief Narrative:  [name] is a 67 y.o. female with medical history significant of asthma type 2 diabetes, hypertension, hyperlipidemia, pulmonary emboli and, depression, , coronary artery disease, stent placement following a heart attack in January 18 2016, seizure disorder with occasional episodes of confusion, chronic diastolic CHF who presents with URI for last two days, cough, dyspnea, chest tightness. She has been using her nebulizer treatment and prednisone without improvement. She has sick contact.     Assessment & Plan:   Principal Problem:   Asthma exacerbation Active Problems:   Depression with anxiety   Essential hypertension   G E R D   PULMONARY EMBOLISM, HX OF   Diabetes mellitus type 2, controlled ([place])   Acute bronchitis with asthma with acute exacerbation   Chronic diastolic CHF (congestive heart failure) (HCC)   Macrocytic anemia  1-Acute Asthma exacerbation; She is still having significant wheezing, and dyspnea.  Continue with nebulizer, add ipratropium.  Continue with solumedrol 60 mg IV TID.  Check D dimer, if positive will need CT angio.  Will give mg IV   Acute Respiratory failure;  Related to asthma exacerbation.  Nebulizer, oxygen.    Anemia;  Anemia panel; iron 72, ferritin 135 B-12 589. Might need iron trial.   CAD; Continue with aspirin, lipitor, Bystolic.   Chest pain;  Enzymes; negative.    Acute on chronic Diastolic HF;  Resume torsemide.  Carefull  Weight 166. Regular weight 161---165.  History of seizure;  Continue with Depakote.   DM; type 2.  SSI.    DVT prophylaxis: Lovenox Code Status: full code. Per nurse report, patient wants to be full code.  Family Communication: care discussed with patient.  Disposition Plan: home when stable.   Consultants:   CCM   Procedures:   Doppler LE.    Antimicrobials:  Azithromycin   Subjective: She is still having SOB, wheezing.  Mild chest tightness.   Objective: Vitals:   10/06/17 0115 10/06/17 0130 10/06/17 0543 10/06/17 0607  BP:  (!) 154/74 (!) 152/78   Pulse:  (!) 101 (!) 123   Resp:  (!) 25    Temp:  (!) 97.5 F (36.4 C) (!) 97.4 F (36.3 C)   TempSrc:  Axillary Axillary   SpO2: 98% 97% 98% 100%  Weight:  75.3 kg (166 lb 1.6 oz)    Height:  5\' 2"  (1.575 m)      Intake/Output Summary (Last 24 hours) at 10/06/2017 0707 Last data filed at 10/06/2017 0300 Gross per 24 hour  Intake 236.17 ml  Output -  Net 236.17 ml   Filed Weights   10/05/17 1523 10/06/17 0130  Weight: 74.8 kg (165 lb) 75.3 kg (166 lb 1.6 oz)    Examination:  General exam: Appears calm and comfortable  Respiratory system: Bilateral wheezing.  Cardiovascular system: S1 & S2 heard, RRR. No JVD, murmurs, rubs, gallops or clicks. No pedal edema. Gastrointestinal system: Abdomen is nondistended, soft and nontender. No organomegaly or masses felt. Normal bowel sounds heard. Central nervous system: Alert and oriented. No focal neurological deficits. Extremities: Symmetric 5 x 5 power. Skin: No rashes, lesions or ulcers Psychiatry: Judgement and insight appear normal. Mood & affect appropriate.     Data Reviewed: I have personally reviewed following labs and imaging studies  CBC: Recent Labs  Lab 10/05/17 1625  WBC 7.2  NEUTROABS 4.5  HGB 10.2*  HCT 32.7*  MCV 100.0  PLT 962   Basic Metabolic Panel: Recent Labs  Lab 10/05/17 1625  NA 142  K 3.9  CL 106  CO2 29  GLUCOSE 116*  BUN 18  CREATININE 1.01*  CALCIUM 9.1   GFR: Estimated Creatinine Clearance: 50.7 mL/min (A) (by C-G formula based on SCr of 1.01 mg/dL (H)). Liver Function Tests: No results for input(s): AST, ALT, ALKPHOS, BILITOT, PROT, ALBUMIN in the last 168 hours. No results for input(s): LIPASE, AMYLASE in the last 168 hours. No results for input(s): AMMONIA in  the last 168 hours. Coagulation Profile: No results for input(s): INR, PROTIME in the last 168 hours. Cardiac Enzymes: Recent Labs  Lab 10/06/17 0253  TROPONINI <0.03   BNP (last 3 results) No results for input(s): PROBNP in the last 8760 hours. HbA1C: Recent Labs    10/06/17 0253  HGBA1C 7.3*   CBG: Recent Labs  Lab 10/06/17 0211 10/06/17 0600  GLUCAP 337* 331*   Lipid Profile: No results for input(s): CHOL, HDL, LDLCALC, TRIG, CHOLHDL, LDLDIRECT in the last 72 hours. Thyroid Function Tests: Recent Labs    10/06/17 0253  TSH 0.534   Anemia Panel: Recent Labs    10/06/17 0253  FOLATE 15.0   Sepsis Labs: No results for input(s): PROCALCITON, LATICACIDVEN in the last 168 hours.  No results found for this or any previous visit (from the past 240 hour(s)).       Radiology Studies: Dg Chest 2 View  Result Date: 10/05/2017 CLINICAL DATA:  Asthma exacerbation. Cough for 2 days. Hypertension. Ex-smoker. EXAM: CHEST - 2 VIEW COMPARISON:  08/12/2017 FINDINGS: Midline trachea. Mild cardiomegaly. Tortuous thoracic aorta. No pleural effusion or pneumothorax. Clear lungs. IMPRESSION: Mild cardiomegaly, without acute disease. Electronically Signed   By: Abigail Miyamoto M.D.   On: 10/05/2017 15:59        Scheduled Meds: . albuterol  2.5 mg Nebulization Q6H  . aspirin EC  81 mg Oral Daily  . atorvastatin  40 mg Oral Daily  . azithromycin  500 mg Oral Daily  . budesonide (PULMICORT) nebulizer solution  0.5 mg Nebulization BID  . diltiazem  240 mg Oral Daily  . divalproex  1,000 mg Oral QHS  . enoxaparin (LOVENOX) injection  40 mg Subcutaneous Q24H  . esomeprazole  40 mg Oral Daily  . famotidine  10 mg Oral Daily  . guaiFENesin  600 mg Oral BID  . hydrALAZINE  25 mg Oral BID  . insulin aspart  0-15 Units Subcutaneous TID WC  . insulin aspart  0-5 Units Subcutaneous QHS  . irbesartan  300 mg Oral Daily  . loratadine  10 mg Oral Daily  . methylPREDNISolone  (SOLU-MEDROL) injection  60 mg Intravenous BID   Followed by  . predniSONE  40 mg Oral Q supper  . nebivolol  5 mg Oral Daily   Continuous Infusions: . sodium chloride 50 mL/hr at 10/06/17 0243     LOS: 0 days    Time spent: 35 minutes.     Elmarie Shiley, MD [facility] Pager [phone]  If 7PM-7AM, please contact night-coverage [email or web address] Password [email or web address] 10/06/2017, 7:07 AM

## 2017-10-06 NOTE — Progress Notes (Signed)
Initial Pulmonary/Critical Care Consultation  Patient Name: [name] MRN: [id number] DOB: 04/17/1951    ADMISSION DATE:  10/05/2017 CONSULTATION DATE:  10/06/2017  REFERRING MD:  Dr. Toy Baker  REASON FOR CONSULTATION:  Asthma exacerbation   HISTORY OF PRESENT ILLNESS  This 67 y.o. African American female is seen in consultation at the request of Dr. Toy Baker for recommendations on further evaluation and management of asthma exacerbation. She is seen on 4E, where the patient has been admitted from the ER. She states that she already feels significantly improved when compared to her initial presentation earlier tonight.  The patient was recently hospitalized in April 2019 with asthma exacerbation and has been home for about 6 weeks, during which she reports that she was doing well until a couple of days ago, when she noticed that she was developing increased dyspnea and audible wheezing. No reported increase in cough or sputum production. She does have a known sick contact, great grandchild who is apparently ill with a respiratory viral illness. She denies fever or chills. She denies chest pain. She has noted that she has had an overall increase in her weight and lower extremity edema. She denies paroxysmal nocturnal dyspnea but does endorse mild orthopnea. She weighs herself daily and believes that 161-165 lbs is her normal range, and then reports that she was 166 lbs yesterday morning.   VITAL SIGNS: BP 129/79   Pulse (!) 115   Temp 97.6 F (36.4 C) (Oral)   Resp 18   Ht 5\' 2"  (1.575 m)   Wt 75.3 kg (166 lb 1.6 oz)   LMP  (LMP Unknown)   SpO2 100%   BMI 30.38 kg/m   INTAKE / OUTPUT: I/O last 3 completed shifts: In: 236.2 [P.O.:222; I.V.:14.2] Out: -   PHYSICAL EXAMINATION: General: Obese female no acute distress HEENT: Noted to have a vocal cord component of her wheezing Neuro: Intact CV: s1s2 rrr, no m/r/g PULM: even/non-labored, lungs bilaterally mild  distal wheezing LO:VFIE, non-tender, bsx4 active  Extremities: warm/dry, 1 edema  Skin: no rashes or lesions    LABS:  BASIC METABOLIC PROFILE Recent Labs  Lab 10/05/17 1625  NA 142  K 3.9  CL 106  CO2 29  BUN 18  CREATININE 1.01*  GLUCOSE 116*  CALCIUM 9.1   Glucose Recent Labs  Lab 10/06/17 0211 10/06/17 0600 10/06/17 0807  GLUCAP 337* 331* 334*   CBC Recent Labs  Lab 10/05/17 1625 10/06/17 0915  WBC 7.2 9.6  HGB 10.2* 10.2*  HCT 32.7* 32.0*  PLT 265 297   CULTURES: Results for orders placed or performed during the hospital encounter of 07/17/16  Culture, blood (routine x 2) Call MD if unable to obtain prior to antibiotics being given     Status: None   Collection Time: 07/17/16  8:35 PM  Result Value Ref Range Status   Specimen Description BLOOD RIGHT ARM  Final   Special Requests BOTTLES DRAWN AEROBIC ONLY 10CC  Final   Culture NO GROWTH 5 DAYS  Final   Report Status 07/22/2016 FINAL  Final  Culture, blood (routine x 2) Call MD if unable to obtain prior to antibiotics being given     Status: None   Collection Time: 07/17/16  8:47 PM  Result Value Ref Range Status   Specimen Description BLOOD LEFT ARM  Final   Special Requests IN PEDIATRIC BOTTLE 4CC  Final   Culture NO GROWTH 5 DAYS  Final   Report Status 07/22/2016 FINAL  Final  Culture, sputum-assessment     Status: None   Collection Time: 07/19/16  1:16 AM  Result Value Ref Range Status   Specimen Description EXPECTORATED SPUTUM  Final   Special Requests NONE  Final   Sputum evaluation THIS SPECIMEN IS ACCEPTABLE FOR SPUTUM CULTURE  Final   Report Status 07/19/2016 FINAL  Final  Culture, respiratory (NON-Expectorated)     Status: None   Collection Time: 07/19/16  1:16 AM  Result Value Ref Range Status   Specimen Description EXPECTORATED SPUTUM  Final   Special Requests NONE Reflexed from [id number]  Final   Gram Stain   Final    FEW SQUAMOUS EPITHELIAL CELLS PRESENT FEW WBC PRESENT,  PREDOMINANTLY MONONUCLEAR FEW GRAM POSITIVE COCCI IN PAIRS    Culture Consistent with normal respiratory flora.  Final   Report Status 07/21/2016 FINAL  Final    IMAGING: Dg Chest 2 View  Result Date: 10/05/2017 CLINICAL DATA:  Asthma exacerbation. Cough for 2 days. Hypertension. Ex-smoker. EXAM: CHEST - 2 VIEW COMPARISON:  08/12/2017 FINDINGS: Midline trachea. Mild cardiomegaly. Tortuous thoracic aorta. No pleural effusion or pneumothorax. Clear lungs. IMPRESSION: Mild cardiomegaly, without acute disease. Electronically Signed   By: Abigail Miyamoto M.D.   On: 10/05/2017 15:59   ANTIBIOTICS: N/A  SIGNIFICANT EVENTS: 6/8: admitted with asthma exacerbation    ASSESSMENT / PLAN: Principal Problem:   Asthma exacerbation Active Problems:   Depression with anxiety   Essential hypertension   G E R D   PULMONARY EMBOLISM, HX OF   Diabetes mellitus type 2, controlled ([place])   Acute bronchitis with asthma with acute exacerbation   Chronic diastolic CHF (congestive heart failure) (HCC)   Macrocytic anemia   PULMONARY  ASTHMA EXACERBATION Continue bronchodilators Diuresis  Antibiotics Oxygen as needed    CARDIOVASCULAR  DIASTOLIC DYSFUNCTION  HYPERTENSIVE HEART DISEASE  ESSENTIAL HYPERTENSION Diuresis as tolerated Continue antihypertensive  ENDOCRINE CBG (last 3)  Recent Labs    10/06/17 0211 10/06/17 0600 10/06/17 0807  GLUCAP 337* 331* 334*     TYPE 2 DIABETES MELLITUS Holding metformin  sliding scale insulin Diabetes is been exacerbated by steroids therefore changed to resistant scale  GASTROINTESTINAL  GERD, requiring long-term PPI therapy (Protonix intolerance noted; patient takes Nexium at home) GI prophylaxis: Nexium increased to twice daily on 2019 component of vocal cord dysfunction  HEMATOLOGIC  DVT prophylaxis  Pulmonary critical care will see again on Sunday, 10/07/2017 [name] Landry Kadyn Guild ACNP Maryanna Shape PCCM Pager [phone] till 1 pm If no answer  page [phone]- [phone] 10/06/2017, 10:59 AM

## 2017-10-07 ENCOUNTER — Inpatient Hospital Stay (HOSPITAL_COMMUNITY): Payer: Medicare Other

## 2017-10-07 DIAGNOSIS — R609 Edema, unspecified: Secondary | ICD-10-CM

## 2017-10-07 LAB — GLUCOSE, CAPILLARY
GLUCOSE-CAPILLARY: 303 mg/dL — AB (ref 65–99)
Glucose-Capillary: 227 mg/dL — ABNORMAL HIGH (ref 65–99)
Glucose-Capillary: 204 mg/dL — ABNORMAL HIGH (ref 65–99)
Glucose-Capillary: 228 mg/dL — ABNORMAL HIGH (ref 65–99)
Glucose-Capillary: 270 mg/dL — ABNORMAL HIGH (ref 65–99)

## 2017-10-07 LAB — BASIC METABOLIC PANEL
Anion gap: 11 (ref 5–15)
BUN: 35 mg/dL — AB (ref 6–20)
CHLORIDE: 101 mmol/L (ref 101–111)
CO2: 24 mmol/L (ref 22–32)
Calcium: 8.1 mg/dL — ABNORMAL LOW (ref 8.9–10.3)
Creatinine, Ser: 1.39 mg/dL — ABNORMAL HIGH (ref 0.44–1.00)
GFR calc Af Amer: 44 mL/min — ABNORMAL LOW (ref >60)
GFR calc non Af Amer: 38 mL/min — ABNORMAL LOW (ref >60)
GLUCOSE: 248 mg/dL — AB (ref 65–99)
POTASSIUM: 4.3 mmol/L (ref 3.5–5.1)
Sodium: 136 mmol/L (ref 135–145)

## 2017-10-07 LAB — RESPIRATORY PANEL BY PCR
ADENOVIRUS-RVPPCR: NOT DETECTED
Bordetella pertussis: NOT DETECTED
CORONAVIRUS NL63-RVPPCR: NOT DETECTED
Chlamydophila pneumoniae: NOT DETECTED
Coronavirus 229E: NOT DETECTED
Coronavirus HKU1: NOT DETECTED
Coronavirus OC43: NOT DETECTED
INFLUENZA A-RVPPCR: NOT DETECTED
Influenza B: NOT DETECTED
METAPNEUMOVIRUS-RVPPCR: NOT DETECTED
MYCOPLASMA PNEUMONIAE-RVPPCR: NOT DETECTED
PARAINFLUENZA VIRUS 1-RVPPCR: NOT DETECTED
PARAINFLUENZA VIRUS 3-RVPPCR: NOT DETECTED
PARAINFLUENZA VIRUS 4-RVPPCR: NOT DETECTED
Parainfluenza Virus 2: NOT DETECTED
RHINOVIRUS / ENTEROVIRUS - RVPPCR: NOT DETECTED
Respiratory Syncytial Virus: NOT DETECTED

## 2017-10-07 LAB — CBC
HCT: 29.3 % — ABNORMAL LOW (ref 36.0–46.0)
HEMOGLOBIN: 9.5 g/dL — AB (ref 12.0–15.0)
MCH: 31.6 pg (ref 26.0–34.0)
MCHC: 32.4 g/dL (ref 30.0–36.0)
MCV: 97.3 fL (ref 78.0–100.0)
Platelets: 274 10*3/uL (ref 150–400)
RBC: 3.01 MIL/uL — AB (ref 3.87–5.11)
RDW: 13.9 % (ref 11.5–15.5)
WBC: 17.6 10*3/uL — ABNORMAL HIGH (ref 4.0–10.5)

## 2017-10-07 LAB — HIV ANTIBODY (ROUTINE TESTING W REFLEX): HIV Screen 4th Generation wRfx: NONREACTIVE

## 2017-10-07 NOTE — Progress Notes (Signed)
PROGRESS NOTE    [name]  VEL:[id number] DOB: 08/13/1950 DOA: 10/05/2017 PCP: [name], MD    Brief Narrative:  [name] is a 67 y.o. female with medical history significant of asthma type 2 diabetes, hypertension, hyperlipidemia, pulmonary emboli and, depression, , coronary artery disease, stent placement following a heart attack in January 18 2016, seizure disorder with occasional episodes of confusion, chronic diastolic CHF who presents with URI for last two days, cough, dyspnea, chest tightness. She has been using her nebulizer treatment and prednisone without improvement. She has sick contact.     Assessment & Plan:   Principal Problem:   Asthma exacerbation Active Problems:   Depression with anxiety   Essential hypertension   G E R D   PULMONARY EMBOLISM, HX OF   Diabetes mellitus type 2, controlled (HCC)   Acute bronchitis with asthma with acute exacerbation   Chronic diastolic CHF (congestive heart failure) (HCC)   Macrocytic anemia  1-Acute Asthma exacerbation; Continue with nebulizer, add ipratropium.  Continue with solumedrol 60 mg IV TID.  D dimer negative Received IV magnesium.  Report some improvement of dyspnea today. Not at baseline.  Awaiting respiratory panel.   Acute Respiratory failure;  Related to asthma exacerbation.  Nebulizer, oxygen.    Anemia;  Anemia panel; iron 72, ferritin 135 B-12 589. Might need iron trial.   CAD; Continue with aspirin, lipitor, Bystolic.   Chest pain;  Enzymes; negative.  D dimer negative.  Related to asthma exacerbation.   Acute on chronic Diastolic HF;  Resume torsemide.  Carefull with diuretics and renal function.  Weight 166. Regular weight 161---165. Monitor renal function on torsemide.   History of seizure;  Continue with Depakote.   DM; type 2.  SSI.    DVT prophylaxis: Lovenox Code Status: full code. Per nurse report, patient wants to be full code.  Family Communication: care  discussed with patient and husband who was at bedside.   Disposition Plan: home when stable.   Consultants:   CCM   Procedures:   Doppler LE.   Antimicrobials:  Azithromycin   Subjective: She is feeling better than when she came, still with dyspnea, not breathing at baseline.   Objective: Vitals:   10/07/17 0447 10/07/17 0616 10/07/17 0800 10/07/17 1200  BP: (!) 146/82  (!) 144/76 138/72  Pulse: 82  86 85  Resp: 20  18 20   Temp: 97.7 F (36.5 C)  97.6 F (36.4 C) 97.8 F (36.6 C)  TempSrc: Oral  Oral Oral  SpO2: 92% 100% 100% 100%  Weight:      Height:        Intake/Output Summary (Last 24 hours) at 10/07/2017 1401 Last data filed at 10/07/2017 0930 Gross per 24 hour  Intake 650 ml  Output -  Net 650 ml   Filed Weights   10/05/17 1523 10/06/17 0130  Weight: 74.8 kg (165 lb) 75.3 kg (166 lb 1.6 oz)    Examination:  General exam: NAD Respiratory system: Bilateral expiratory wheezing.  Cardiovascular system: S 1, S 2 RRR Gastrointestinal system: BS present, soft, nt Central nervous system: Non focal.  Extremities: Symmetric power.  Skin: No rash  Psychiatry: Mood and affect appropriate     Data Reviewed: I have personally reviewed following labs and imaging studies  CBC: Recent Labs  Lab 10/05/17 1625 10/06/17 0915 10/07/17 0259  WBC 7.2 9.6 17.6*  NEUTROABS 4.5  --   --   HGB 10.2* 10.2* 9.5*  HCT 32.7*  32.0* 29.3*  MCV 100.0 97.9 97.3  PLT 265 297 366   Basic Metabolic Panel: Recent Labs  Lab 10/05/17 1625 10/06/17 0915 10/07/17 0259  NA 142 135 136  K 3.9 3.5 4.3  CL 106 102 101  CO2 29 18* 24  GLUCOSE 116* 365* 248*  BUN 18 23* 35*  CREATININE 1.01* 1.30* 1.39*  CALCIUM 9.1 8.6* 8.1*  MG  --  1.7  --   PHOS  --  2.9  --    GFR: Estimated Creatinine Clearance: 36.8 mL/min (A) (by C-G formula based on SCr of 1.39 mg/dL (H)). Liver Function Tests: Recent Labs  Lab 10/06/17 0915  AST 22  ALT 17  ALKPHOS 48  BILITOT 0.5    PROT 7.2  ALBUMIN 3.4*   No results for input(s): LIPASE, AMYLASE in the last 168 hours. No results for input(s): AMMONIA in the last 168 hours. Coagulation Profile: No results for input(s): INR, PROTIME in the last 168 hours. Cardiac Enzymes: Recent Labs  Lab 10/06/17 0253 10/06/17 0915 10/06/17 1414  TROPONINI <0.03 <0.03 <0.03   BNP (last 3 results) No results for input(s): PROBNP in the last 8760 hours. HbA1C: Recent Labs    10/06/17 0253  HGBA1C 7.3*   CBG: Recent Labs  Lab 10/06/17 1611 10/06/17 2203 10/07/17 0634 10/07/17 0803 10/07/17 1204  GLUCAP 228* 251* 227* 204* 228*   Lipid Profile: No results for input(s): CHOL, HDL, LDLCALC, TRIG, CHOLHDL, LDLDIRECT in the last 72 hours. Thyroid Function Tests: Recent Labs    10/06/17 0253  TSH 0.534   Anemia Panel: Recent Labs    10/06/17 0253 10/06/17 0915  VITAMINB12  --  589  FOLATE 15.0  --   FERRITIN  --  135  TIBC  --  426  IRON  --  72  RETICCTPCT  --  2.5   Sepsis Labs: No results for input(s): PROCALCITON, LATICACIDVEN in the last 168 hours.  No results found for this or any previous visit (from the past 240 hour(s)).       Radiology Studies: Dg Chest 2 View  Result Date: 10/05/2017 CLINICAL DATA:  Asthma exacerbation. Cough for 2 days. Hypertension. Ex-smoker. EXAM: CHEST - 2 VIEW COMPARISON:  08/12/2017 FINDINGS: Midline trachea. Mild cardiomegaly. Tortuous thoracic aorta. No pleural effusion or pneumothorax. Clear lungs. IMPRESSION: Mild cardiomegaly, without acute disease. Electronically Signed   By: Abigail Miyamoto M.D.   On: 10/05/2017 15:59        Scheduled Meds: . aspirin EC  81 mg Oral Daily  . atorvastatin  40 mg Oral Daily  . azithromycin  500 mg Oral Daily  . budesonide (PULMICORT) nebulizer solution  0.5 mg Nebulization BID  . diltiazem  240 mg Oral Daily  . divalproex  1,000 mg Oral QHS  . enoxaparin (LOVENOX) injection  40 mg Subcutaneous Q24H  . esomeprazole  40 mg  Oral BID AC  . famotidine  10 mg Oral Daily  . feeding supplement (GLUCERNA SHAKE)  237 mL Oral BID BM  . guaiFENesin  600 mg Oral BID  . hydrALAZINE  25 mg Oral BID  . insulin aspart  0-20 Units Subcutaneous TID WC  . insulin aspart  0-5 Units Subcutaneous QHS  . insulin glargine  15 Units Subcutaneous Daily  . ipratropium-albuterol  3 mL Nebulization Q6H  . loratadine  10 mg Oral Daily  . methylPREDNISolone (SOLU-MEDROL) injection  60 mg Intravenous TID  . nebivolol  5 mg Oral Daily  . torsemide  20 mg Oral Daily   Continuous Infusions:    LOS: 1 day    Time spent: 35 minutes.     Elmarie Shiley, MD [facility] Pager [phone]  If 7PM-7AM, please contact night-coverage [email or web address] Password [email or web address] 10/07/2017, 2:01 PM

## 2017-10-07 NOTE — Progress Notes (Signed)
LE venous duplex prelim: negative for DVT. [name], RDMS, RVT  

## 2017-10-08 ENCOUNTER — Other Ambulatory Visit: Payer: Self-pay

## 2017-10-08 ENCOUNTER — Encounter (HOSPITAL_COMMUNITY): Payer: Self-pay | Admitting: General Practice

## 2017-10-08 LAB — BASIC METABOLIC PANEL WITH GFR
Anion gap: 10 (ref 5–15)
BUN: 39 mg/dL — ABNORMAL HIGH (ref 6–20)
CO2: 26 mmol/L (ref 22–32)
Calcium: 7.7 mg/dL — ABNORMAL LOW (ref 8.9–10.3)
Chloride: 100 mmol/L — ABNORMAL LOW (ref 101–111)
Creatinine, Ser: 1.37 mg/dL — ABNORMAL HIGH (ref 0.44–1.00)
GFR calc Af Amer: 45 mL/min — ABNORMAL LOW
GFR calc non Af Amer: 39 mL/min — ABNORMAL LOW
Glucose, Bld: 251 mg/dL — ABNORMAL HIGH (ref 65–99)
Potassium: 4.3 mmol/L (ref 3.5–5.1)
Sodium: 136 mmol/L (ref 135–145)

## 2017-10-08 LAB — EXPECTORATED SPUTUM ASSESSMENT W GRAM STAIN, RFLX TO RESP C

## 2017-10-08 LAB — GLUCOSE, CAPILLARY
GLUCOSE-CAPILLARY: 202 mg/dL — AB (ref 65–99)
GLUCOSE-CAPILLARY: 382 mg/dL — AB (ref 65–99)
Glucose-Capillary: 230 mg/dL — ABNORMAL HIGH (ref 65–99)
Glucose-Capillary: 291 mg/dL — ABNORMAL HIGH (ref 65–99)

## 2017-10-08 LAB — CBC
HCT: 30.6 % — ABNORMAL LOW (ref 36.0–46.0)
HEMOGLOBIN: 9.7 g/dL — AB (ref 12.0–15.0)
MCH: 31.1 pg (ref 26.0–34.0)
MCHC: 31.7 g/dL (ref 30.0–36.0)
MCV: 98.1 fL (ref 78.0–100.0)
Platelets: 287 10*3/uL (ref 150–400)
RBC: 3.12 MIL/uL — AB (ref 3.87–5.11)
RDW: 14 % (ref 11.5–15.5)
WBC: 18 10*3/uL — ABNORMAL HIGH (ref 4.0–10.5)

## 2017-10-08 MED ORDER — ALUM & MAG HYDROXIDE-SIMETH 200-200-20 MG/5ML PO SUSP: 15.0000 mL | Freq: Two times a day (BID) | ORAL | Status: DC | PRN

## 2017-10-08 MED ORDER — METHYLPREDNISOLONE SODIUM SUCC 125 MG IJ SOLR
80.0000 mg | Freq: Three times a day (TID) | INTRAMUSCULAR | Status: DC
  Administered 2017-10-08 – 2017-10-09 (×3): 80 mg via INTRAVENOUS
  Filled 2017-10-08 (×3): qty 2

## 2017-10-08 MED ORDER — METHYLPREDNISOLONE SODIUM SUCC 125 MG IJ SOLR: 80.0000 mg | Freq: Three times a day (TID) | INTRAMUSCULAR | Status: DC

## 2017-10-08 MED ORDER — IPRATROPIUM-ALBUTEROL 0.5-2.5 (3) MG/3ML IN SOLN
3.0000 mL | RESPIRATORY_TRACT | Status: DC
  Administered 2017-10-08 – 2017-10-11 (×20): 3 mL via RESPIRATORY_TRACT
  Filled 2017-10-08 (×21): qty 3

## 2017-10-08 MED ORDER — MAGNESIUM SULFATE 2 GM/50ML IV SOLN
2.0000 g | Freq: Once | INTRAVENOUS | Status: AC
  Administered 2017-10-08: 2 g via INTRAVENOUS
  Filled 2017-10-08: qty 50

## 2017-10-08 MED ORDER — TORSEMIDE 20 MG PO TABS
20.0000 mg | ORAL_TABLET | Freq: Once | ORAL | Status: AC
  Administered 2017-10-08: 20 mg via ORAL
  Filled 2017-10-08: qty 1

## 2017-10-08 NOTE — Progress Notes (Signed)
Physical Therapy Treatment Patient Details Name: [name] MRN: [id number] DOB: 10/12/1950 Today's Date: 10/08/2017    History of Present Illness [name] is a 67 y.o. female with medical history significant of asthma type 2 diabetes, hypertension, hyperlipidemia, pulmonary emboli and, depression, , coronary artery disease, stent placement following a heart attack in January 18 2016, seizure disorder with occasional episodes of confusion, chronic diastolic CHF, HLD, GERD, HTN    PT Comments    Progressing with mobility. Distance limited by wheezing and dyspnea.   Follow Up Recommendations  No PT follow up;Supervision for mobility/OOB     Equipment Recommendations  None recommended by PT    Recommendations for Other Services       Precautions / Restrictions Precautions Precautions: None    Mobility  Bed Mobility Overal bed mobility: Modified Independent                Transfers Overall transfer level: Modified independent                  Ambulation/Gait Ambulation/Gait assistance: Supervision Ambulation Distance (Feet): 160 Feet Assistive device: None Gait Pattern/deviations: Step-through pattern;Decreased stride length Gait velocity: decreased Gait velocity interpretation: 1.31 - 2.62 ft/sec, indicative of limited community ambulator General Gait Details: Amb with slow gait with 1 standing rest. Amb on RA with SpO2 > 96%. Pt with audible wheezing throughout   Stairs             Wheelchair Mobility    Modified Rankin (Stroke Patients Only)       Balance Overall balance assessment: Mild deficits observed, not formally tested                                          Cognition Arousal/Alertness: Awake/alert Behavior During Therapy: WFL for tasks assessed/performed Overall Cognitive Status: Within Functional Limits for tasks assessed                                        Exercises       General Comments        Pertinent Vitals/Pain      Home Living Family/patient expects to be discharged to:: Private residence Living Arrangements: Spouse/significant other                  Prior Function            PT Goals (current goals can now be found in the care plan section) Progress towards PT goals: Progressing toward goals    Frequency    Min 3X/week      PT Plan Current plan remains appropriate    Co-evaluation              AM-PAC PT "6 Clicks" Daily Activity  Outcome Measure  Difficulty turning over in bed (including adjusting bedclothes, sheets and blankets)?: None Difficulty moving from lying on back to sitting on the side of the bed? : None Difficulty sitting down on and standing up from a chair with arms (e.g., wheelchair, bedside commode, etc,.)?: None Help needed moving to and from a bed to chair (including a wheelchair)?: None Help needed walking in hospital room?: None Help needed climbing 3-5 steps with a railing? : A Little 6 Click Score: 23    End of Session  Activity Tolerance: Patient tolerated treatment well Patient left: in bed;with call bell/phone within reach   PT Visit Diagnosis: Unsteadiness on feet (R26.81);Other abnormalities of gait and mobility (R26.89)     Time: 1050-1109 PT Time Calculation (min) (ACUTE ONLY): 19 min  Charges:  $Gait Training: 8-22 mins                    G Codes:       [facility] PT [place] 10/08/2017, 12:36 PM

## 2017-10-08 NOTE — Progress Notes (Signed)
67 year old remote smoker, quit 2005 admitted 6/7 with asthma exacerbation. She was recently hospitalized in April for an exacerbation. Sick contact with her grandchild, respiratory viral panel was negative.  She continues to have significant dyspnea on walking to the bathroom and audible wheezing from a distance.  Cough with green sputum production. She also complains of green sputum production. Chest x-ray 6/7 did not show any infiltrates.  On exam-mild accessory muscle usage, able to speak in full sentences, diffuse bilateral rhonchi, S1-S2 normal, 1+ bipedal edema, no JVD, soft nontender abdomen.  Impression/plan Asthma exacerbation-note normal spirometry 08/2017. Continue Solu-Medrol 80 every 8 until bronchospasm subsides and then can start taper. Continue duo nebs 4 times daily and as needed.  Appears to be infective exacerbation, Ultra-Sil of Zithromax with green sputum will culture and decide about more antibiotics if needed  GERD already on-Nexium twice daily, will add Maalox as needed.  Leanna Sato Elsworth Soho MD [phone]

## 2017-10-08 NOTE — Progress Notes (Signed)
PROGRESS NOTE    [name]  AYT:[id number] DOB: 12-23-1950 DOA: 10/05/2017 PCP: [name], MD    Brief Narrative:  [name] is a 67 y.o. female with medical history significant of asthma type 2 diabetes, hypertension, hyperlipidemia, pulmonary emboli and, depression, , coronary artery disease, stent placement following a heart attack in January 18 2016, seizure disorder with occasional episodes of confusion, chronic diastolic CHF who presents with URI for last two days, cough, dyspnea, chest tightness. She has been using her nebulizer treatment and prednisone without improvement. She has sick contact.     Assessment & Plan:   Principal Problem:   Asthma exacerbation Active Problems:   Depression with anxiety   Essential hypertension   G E R D   PULMONARY EMBOLISM, HX OF   Diabetes mellitus type 2, controlled (HCC)   Acute bronchitis with asthma with acute exacerbation   Chronic diastolic CHF (congestive heart failure) (HCC)   Macrocytic anemia  1-Acute Asthma exacerbation; Continue with nebulizer, add ipratropium.  Continue with solumedrol 80 mg IV TID.  D dimer negative Received IV magnesium.  Respiratory panel; negative Still with wheezing. Will increase solumedrol to 80 mg as recommend by pulmonologist  IV magnesium.   Acute Respiratory failure;  Related to asthma exacerbation.  Nebulizer, oxygen.    Anemia;  Anemia panel; iron 72, ferritin 135 B-12 589. Might need iron trial.   CAD; Continue with aspirin, lipitor, Bystolic.   Chest pain;  Enzymes; negative.  D dimer negative.  Related to asthma exacerbation.   Acute on chronic Diastolic HF;  Resume torsemide.  Carefull with diuretics and renal function.  Weight 166. Regular weight 161---165.167 Monitor renal function on torsemide.  Will give extra dose of torsemide.   History of seizure;  Continue with Depakote.   DM; type 2.  SSI.    DVT prophylaxis: Lovenox Code Status: full  code. Per nurse report, patient wants to be full code.  Family Communication: care discussed with patient and husband who was at bedside.   Disposition Plan: home when stable.   Consultants:   CCM   Procedures:   Doppler LE.   Antimicrobials:  Azithromycin   Subjective: She is still SOB, not better, still wheezing a lot.   Objective: Vitals:   10/08/17 0729 10/08/17 0752 10/08/17 1112 10/08/17 1554  BP:  (!) 155/84 (!) 147/92   Pulse:  87 89   Resp:      Temp:  97.7 F (36.5 C) 97.8 F (36.6 C)   TempSrc:  Oral Oral   SpO2: 100% 99% 99% 100%  Weight:      Height:        Intake/Output Summary (Last 24 hours) at 10/08/2017 1634 Last data filed at 10/08/2017 1214 Gross per 24 hour  Intake 730 ml  Output -  Net 730 ml   Filed Weights   10/06/17 0130 10/07/17 1700 10/08/17 0524  Weight: 75.3 kg (166 lb 1.6 oz) 75.3 kg (166 lb) 76.1 kg (167 lb 12.8 oz)    Examination:  General exam: NAD Respiratory system: Bilateral Expiratory wheezing.  Cardiovascular system:  S 1, S 2 RRR Gastrointestinal system: BS present, soft, nt Central nervous system: Non focal.  Extremities: Symmetric power.  Skin: No rash  Psychiatry: Mood and affect appropriate     Data Reviewed: I have personally reviewed following labs and imaging studies  CBC: Recent Labs  Lab 10/05/17 1625 10/06/17 0915 10/07/17 0259 10/08/17 0333  WBC 7.2 9.6 17.6* 18.0*  NEUTROABS 4.5  --   --   --   HGB 10.2* 10.2* 9.5* 9.7*  HCT 32.7* 32.0* 29.3* 30.6*  MCV 100.0 97.9 97.3 98.1  PLT 265 297 274 751   Basic Metabolic Panel: Recent Labs  Lab 10/05/17 1625 10/06/17 0915 10/07/17 0259 10/08/17 0333  NA 142 135 136 136  K 3.9 3.5 4.3 4.3  CL 106 102 101 100*  CO2 29 18* 24 26  GLUCOSE 116* 365* 248* 251*  BUN 18 23* 35* 39*  CREATININE 1.01* 1.30* 1.39* 1.37*  CALCIUM 9.1 8.6* 8.1* 7.7*  MG  --  1.7  --   --   PHOS  --  2.9  --   --    GFR: Estimated Creatinine Clearance: 37.5 mL/min  (A) (by C-G formula based on SCr of 1.37 mg/dL (H)). Liver Function Tests: Recent Labs  Lab 10/06/17 0915  AST 22  ALT 17  ALKPHOS 48  BILITOT 0.5  PROT 7.2  ALBUMIN 3.4*   No results for input(s): LIPASE, AMYLASE in the last 168 hours. No results for input(s): AMMONIA in the last 168 hours. Coagulation Profile: No results for input(s): INR, PROTIME in the last 168 hours. Cardiac Enzymes: Recent Labs  Lab 10/06/17 0253 10/06/17 0915 10/06/17 1414  TROPONINI <0.03 <0.03 <0.03   BNP (last 3 results) No results for input(s): PROBNP in the last 8760 hours. HbA1C: Recent Labs    10/06/17 0253  HGBA1C 7.3*   CBG: Recent Labs  Lab 10/07/17 1204 10/07/17 1710 10/07/17 2146 10/08/17 0615 10/08/17 1117  GLUCAP 228* 270* 303* 230* 291*   Lipid Profile: No results for input(s): CHOL, HDL, LDLCALC, TRIG, CHOLHDL, LDLDIRECT in the last 72 hours. Thyroid Function Tests: Recent Labs    10/06/17 0253  TSH 0.534   Anemia Panel: Recent Labs    10/06/17 0253 10/06/17 0915  VITAMINB12  --  589  FOLATE 15.0  --   FERRITIN  --  135  TIBC  --  426  IRON  --  72  RETICCTPCT  --  2.5   Sepsis Labs: No results for input(s): PROCALCITON, LATICACIDVEN in the last 168 hours.  Recent Results (from the past 240 hour(s))  Respiratory Panel by PCR     Status: None   Collection Time: 10/07/17  1:18 PM  Result Value Ref Range Status   Adenovirus NOT DETECTED NOT DETECTED Final   Coronavirus 229E NOT DETECTED NOT DETECTED Final   Coronavirus HKU1 NOT DETECTED NOT DETECTED Final   Coronavirus NL63 NOT DETECTED NOT DETECTED Final   Coronavirus OC43 NOT DETECTED NOT DETECTED Final   Metapneumovirus NOT DETECTED NOT DETECTED Final   Rhinovirus / Enterovirus NOT DETECTED NOT DETECTED Final   Influenza A NOT DETECTED NOT DETECTED Final   Influenza B NOT DETECTED NOT DETECTED Final   Parainfluenza Virus 1 NOT DETECTED NOT DETECTED Final   Parainfluenza Virus 2 NOT DETECTED NOT  DETECTED Final   Parainfluenza Virus 3 NOT DETECTED NOT DETECTED Final   Parainfluenza Virus 4 NOT DETECTED NOT DETECTED Final   Respiratory Syncytial Virus NOT DETECTED NOT DETECTED Final   Bordetella pertussis NOT DETECTED NOT DETECTED Final   Chlamydophila pneumoniae NOT DETECTED NOT DETECTED Final   Mycoplasma pneumoniae NOT DETECTED NOT DETECTED Final    Comment: Performed at [facility], [address], [place], [place] [address]  Culture, expectorated sputum-assessment     Status: None   Collection Time: 10/08/17 12:15 PM  Result Value Ref  Range Status   Specimen Description SPUTUM  Final   Special Requests NONE  Final   Sputum evaluation   Final    THIS SPECIMEN IS ACCEPTABLE FOR SPUTUM CULTURE Performed at [facility], [address] [place], [place] [address]    Report Status 10/08/2017 FINAL  Final  Culture, respiratory (NON-Expectorated)     Status: None (Preliminary result)   Collection Time: 10/08/17 12:15 PM  Result Value Ref Range Status   Specimen Description SPUTUM  Final   Special Requests NONE Reflexed from [id number]  Final   Gram Stain   Final    RARE WBC PRESENT, PREDOMINANTLY PMN RARE SQUAMOUS EPITHELIAL CELLS PRESENT MODERATE GRAM POSITIVE COCCI IN PAIRS IN CHAINS IN CLUSTERS RARE GRAM VARIABLE ROD Performed at [facility], [place] [address], [place], [place] [address]    Culture PENDING  Incomplete   Report Status PENDING  Incomplete         Radiology Studies: No results found.      Scheduled Meds: . aspirin EC  81 mg Oral Daily  . atorvastatin  40 mg Oral Daily  . budesonide (PULMICORT) nebulizer solution  0.5 mg Nebulization BID  . diltiazem  240 mg Oral Daily  . divalproex  1,000 mg Oral QHS  . enoxaparin (LOVENOX) injection  40 mg Subcutaneous Q24H  . esomeprazole  40 mg Oral BID AC  . famotidine  10 mg Oral Daily  . feeding supplement (GLUCERNA SHAKE)  237 mL Oral BID BM  . guaiFENesin  600 mg Oral BID  .  hydrALAZINE  25 mg Oral BID  . insulin aspart  0-20 Units Subcutaneous TID WC  . insulin aspart  0-5 Units Subcutaneous QHS  . insulin glargine  15 Units Subcutaneous Daily  . ipratropium-albuterol  3 mL Nebulization Q4H WA  . loratadine  10 mg Oral Daily  . methylPREDNISolone (SOLU-MEDROL) injection  80 mg Intravenous TID  . nebivolol  5 mg Oral Daily  . torsemide  20 mg Oral Daily   Continuous Infusions:    LOS: 2 days    Time spent: 35 minutes.     Jacqueline Shiley, MD [facility] Pager [phone]  If 7PM-7AM, please contact night-coverage [email or web address] Password [facility] 10/08/2017, 4:34 PM

## 2017-10-09 ENCOUNTER — Inpatient Hospital Stay (HOSPITAL_COMMUNITY): Payer: Medicare Other

## 2017-10-09 LAB — BASIC METABOLIC PANEL WITH GFR
Anion gap: 10 (ref 5–15)
BUN: 46 mg/dL — ABNORMAL HIGH (ref 6–20)
CO2: 27 mmol/L (ref 22–32)
Calcium: 7.3 mg/dL — ABNORMAL LOW (ref 8.9–10.3)
Chloride: 97 mmol/L — ABNORMAL LOW (ref 101–111)
Creatinine, Ser: 1.6 mg/dL — ABNORMAL HIGH (ref 0.44–1.00)
GFR calc Af Amer: 37 mL/min — ABNORMAL LOW
GFR calc non Af Amer: 32 mL/min — ABNORMAL LOW
Glucose, Bld: 292 mg/dL — ABNORMAL HIGH (ref 65–99)
Potassium: 4 mmol/L (ref 3.5–5.1)
Sodium: 134 mmol/L — ABNORMAL LOW (ref 135–145)

## 2017-10-09 LAB — CBC
HEMATOCRIT: 31.1 % — AB (ref 36.0–46.0)
HEMOGLOBIN: 9.9 g/dL — AB (ref 12.0–15.0)
MCH: 31.1 pg (ref 26.0–34.0)
MCHC: 31.8 g/dL (ref 30.0–36.0)
MCV: 97.8 fL (ref 78.0–100.0)
Platelets: 289 10*3/uL (ref 150–400)
RBC: 3.18 MIL/uL — AB (ref 3.87–5.11)
RDW: 13.9 % (ref 11.5–15.5)
WBC: 15.4 10*3/uL — ABNORMAL HIGH (ref 4.0–10.5)

## 2017-10-09 LAB — GLUCOSE, CAPILLARY
GLUCOSE-CAPILLARY: 300 mg/dL — AB (ref 65–99)
Glucose-Capillary: 341 mg/dL — ABNORMAL HIGH (ref 65–99)
Glucose-Capillary: 377 mg/dL — ABNORMAL HIGH (ref 65–99)
Glucose-Capillary: 290 mg/dL — ABNORMAL HIGH (ref 65–99)

## 2017-10-09 MED ORDER — IPRATROPIUM-ALBUTEROL 0.5-2.5 (3) MG/3ML IN SOLN
3.0000 mL | RESPIRATORY_TRACT | Status: DC | PRN
  Administered 2017-10-11 – 2017-10-17 (×3): 3 mL via RESPIRATORY_TRACT
  Filled 2017-10-09 (×4): qty 3

## 2017-10-09 MED ORDER — PREDNISONE 20 MG PO TABS
40.0000 mg | ORAL_TABLET | Freq: Every day | ORAL | Status: DC
  Administered 2017-10-09 – 2017-10-11 (×3): 40 mg via ORAL
  Filled 2017-10-09: qty 2
  Filled 2017-10-09: qty 4
  Filled 2017-10-09 (×2): qty 2

## 2017-10-09 MED ORDER — FUROSEMIDE 10 MG/ML IJ SOLN
20.0000 mg | Freq: Once | INTRAMUSCULAR | Status: AC
Start: 2017-10-09 — End: 2017-10-09
  Administered 2017-10-09: 20 mg via INTRAVENOUS
  Filled 2017-10-09: qty 2

## 2017-10-09 MED ORDER — MAGNESIUM SULFATE 2 GM/50ML IV SOLN
2.0000 g | Freq: Once | INTRAVENOUS | Status: AC
  Administered 2017-10-09: 2 g via INTRAVENOUS
  Filled 2017-10-09: qty 50

## 2017-10-09 NOTE — Progress Notes (Addendum)
PROGRESS NOTE    [name]  WUJ:[id number] DOB: 1951/01/20 DOA: 10/05/2017 PCP: [name], MD    Brief Narrative:  [name] is a 67 y.o. female with medical history significant of asthma type 2 diabetes, hypertension, hyperlipidemia, pulmonary emboli and, depression, , coronary artery disease, stent placement following a heart attack in January 18 2016, seizure disorder with occasional episodes of confusion, chronic diastolic CHF who presents with URI for last two days, cough, dyspnea, chest tightness. She has been using her nebulizer treatment and prednisone without improvement. She has sick contact.    Patient admitted with dyspnea, acute asthma exacerbation. She has been on IV solumedrol, nebulizer. CCM consulted and following.. Plan is to proceed with GI evaluation for reflux.   Assessment & Plan:   Principal Problem:   Asthma exacerbation Active Problems:   Depression with anxiety   Essential hypertension   G E R D   PULMONARY EMBOLISM, HX OF   Diabetes mellitus type 2, controlled (HCC)   Acute bronchitis with asthma with acute exacerbation   Chronic diastolic CHF (congestive heart failure) (HCC)   Macrocytic anemia  1-Acute Asthma exacerbation; Continue with nebulizer, add ipratropium.  Continue with solumedrol 80 mg IV TID.  D dimer negative Received IV magnesium.  Respiratory panel; negative IV magnesium.  Worse overnight. Chest x ray hypoventilation.  Not better. CCM recommend GI evaluation for GERD  Acute Respiratory failure; hypoxia  Related to asthma exacerbation.  Nebulizer, oxygen.    Anemia;  Anemia panel; iron 72, ferritin 135 B-12 589. Might need iron trial.  Hb stable.   CAD; Continue with aspirin, lipitor, Bystolic.   Chest pain;  Enzymes; negative.  D dimer negative.  Related to asthma exacerbation.   Acute on chronic Diastolic HF;  Resume torsemide.  Carefull with diuretics and renal function.  Weight 166. Regular weight  161---165.167 Received IV lasix overnight. Cr increase to 1.6. Will hold torsemide.   History of seizure;  Continue with Depakote.   DM; type 2.  SSI.   AKI; hold diuretics. Received dose of torsemide.  Repeat labs in am.    DVT prophylaxis: Lovenox Code Status: full code. Per nurse report, patient wants to be full code.  Family Communication: care discussed with patient and husband who was at bedside.   Disposition Plan: home when stable.   Consultants:   CCM   Procedures:   Doppler LE.   Antimicrobials:  Azithromycin   Subjective: She was very short of breath last night. received lasix, and nebulizer.  She is not feeling better, still with significant SOB.   Objective: Vitals:   10/09/17 0025 10/09/17 0206 10/09/17 0408 10/09/17 0433  BP: (!) 159/77  (!) 154/81   Pulse: 92  81   Resp:      Temp: 97.7 F (36.5 C)  (!) 97.3 F (36.3 C)   TempSrc: Oral  Oral   SpO2: 98% 100% 100% 100%  Weight:   78.2 kg (172 lb 6.4 oz)   Height:        Intake/Output Summary (Last 24 hours) at 10/09/2017 0741 Last data filed at 10/09/2017 0600 Gross per 24 hour  Intake 1460 ml  Output 1450 ml  Net 10 ml   Filed Weights   10/07/17 1700 10/08/17 0524 10/09/17 0408  Weight: 75.3 kg (166 lb) 76.1 kg (167 lb 12.8 oz) 78.2 kg (172 lb 6.4 oz)    Examination:  General exam: Mild tachypnea.  Respiratory system: Bilateral wheezing.  Cardiovascular system:  S 1, S 2 RRR Gastrointestinal system: BS present, soft, nt Central nervous system: non focal.  Extremities: symmetric power.  Skin: no rash      Data Reviewed: I have personally reviewed following labs and imaging studies  CBC: Recent Labs  Lab 10/05/17 1625 10/06/17 0915 10/07/17 0259 10/08/17 0333  WBC 7.2 9.6 17.6* 18.0*  NEUTROABS 4.5  --   --   --   HGB 10.2* 10.2* 9.5* 9.7*  HCT 32.7* 32.0* 29.3* 30.6*  MCV 100.0 97.9 97.3 98.1  PLT 265 297 274 976   Basic Metabolic Panel: Recent Labs  Lab  10/05/17 1625 10/06/17 0915 10/07/17 0259 10/08/17 0333  NA 142 135 136 136  K 3.9 3.5 4.3 4.3  CL 106 102 101 100*  CO2 29 18* 24 26  GLUCOSE 116* 365* 248* 251*  BUN 18 23* 35* 39*  CREATININE 1.01* 1.30* 1.39* 1.37*  CALCIUM 9.1 8.6* 8.1* 7.7*  MG  --  1.7  --   --   PHOS  --  2.9  --   --    GFR: Estimated Creatinine Clearance: 38 mL/min (A) (by C-G formula based on SCr of 1.37 mg/dL (H)). Liver Function Tests: Recent Labs  Lab 10/06/17 0915  AST 22  ALT 17  ALKPHOS 48  BILITOT 0.5  PROT 7.2  ALBUMIN 3.4*   No results for input(s): LIPASE, AMYLASE in the last 168 hours. No results for input(s): AMMONIA in the last 168 hours. Coagulation Profile: No results for input(s): INR, PROTIME in the last 168 hours. Cardiac Enzymes: Recent Labs  Lab 10/06/17 0253 10/06/17 0915 10/06/17 1414  TROPONINI <0.03 <0.03 <0.03   BNP (last 3 results) No results for input(s): PROBNP in the last 8760 hours. HbA1C: No results for input(s): HGBA1C in the last 72 hours. CBG: Recent Labs  Lab 10/08/17 0615 10/08/17 1117 10/08/17 1703 10/08/17 2117 10/09/17 0603  GLUCAP 230* 291* 202* 382* 341*   Lipid Profile: No results for input(s): CHOL, HDL, LDLCALC, TRIG, CHOLHDL, LDLDIRECT in the last 72 hours. Thyroid Function Tests: No results for input(s): TSH, T4TOTAL, FREET4, T3FREE, THYROIDAB in the last 72 hours. Anemia Panel: Recent Labs    10/06/17 0915  VITAMINB12 589  FERRITIN 135  TIBC 426  IRON 72  RETICCTPCT 2.5   Sepsis Labs: No results for input(s): PROCALCITON, LATICACIDVEN in the last 168 hours.  Recent Results (from the past 240 hour(s))  Respiratory Panel by PCR     Status: None   Collection Time: 10/07/17  1:18 PM  Result Value Ref Range Status   Adenovirus NOT DETECTED NOT DETECTED Final   Coronavirus 229E NOT DETECTED NOT DETECTED Final   Coronavirus HKU1 NOT DETECTED NOT DETECTED Final   Coronavirus NL63 NOT DETECTED NOT DETECTED Final    Coronavirus OC43 NOT DETECTED NOT DETECTED Final   Metapneumovirus NOT DETECTED NOT DETECTED Final   Rhinovirus / Enterovirus NOT DETECTED NOT DETECTED Final   Influenza A NOT DETECTED NOT DETECTED Final   Influenza B NOT DETECTED NOT DETECTED Final   Parainfluenza Virus 1 NOT DETECTED NOT DETECTED Final   Parainfluenza Virus 2 NOT DETECTED NOT DETECTED Final   Parainfluenza Virus 3 NOT DETECTED NOT DETECTED Final   Parainfluenza Virus 4 NOT DETECTED NOT DETECTED Final   Respiratory Syncytial Virus NOT DETECTED NOT DETECTED Final   Bordetella pertussis NOT DETECTED NOT DETECTED Final   Chlamydophila pneumoniae NOT DETECTED NOT DETECTED Final   Mycoplasma pneumoniae NOT DETECTED NOT DETECTED Final  Comment: Performed at [facility], [place] [address] [place], [place] [address]  Culture, expectorated sputum-assessment     Status: None   Collection Time: 10/08/17 12:15 PM  Result Value Ref Range Status   Specimen Description SPUTUM  Final   Special Requests NONE  Final   Sputum evaluation   Final    THIS SPECIMEN IS ACCEPTABLE FOR SPUTUM CULTURE Performed at [facility], [place] [address] [place], [place] [address]    Report Status 10/08/2017 FINAL  Final  Culture, respiratory (NON-Expectorated)     Status: None (Preliminary result)   Collection Time: 10/08/17 12:15 PM  Result Value Ref Range Status   Specimen Description SPUTUM  Final   Special Requests NONE Reflexed from [id number]  Final   Gram Stain   Final    RARE WBC PRESENT, PREDOMINANTLY PMN RARE SQUAMOUS EPITHELIAL CELLS PRESENT MODERATE GRAM POSITIVE COCCI IN PAIRS IN CHAINS IN CLUSTERS RARE GRAM VARIABLE ROD Performed at [facility], [place] [address] [place],  [address]    Culture PENDING  Incomplete   Report Status PENDING  Incomplete         Radiology Studies: Dg Chest Port 1 View  Result Date: 10/09/2017 CLINICAL DATA:  Shortness of breath.  Wheezing this morning. EXAM: PORTABLE CHEST 1  VIEW COMPARISON:  Frontal and lateral views 4 days ago 10/05/2017. Additional priors FINDINGS: Lower lung volumes from prior exam leading to bronchovascular crowding. Similar mild cardiomegaly and unchanged mediastinal contours. Mild left greater than right basilar atelectasis. No confluent airspace disease. No large pleural effusion or pneumothorax. Unchanged osseous structures. IMPRESSION: Hypoventilatory chest. Similar cardiomegaly and bibasilar atelectasis allowing for differences in technique. Electronically Signed   By: Jeb Levering M.D.   On: 10/09/2017 06:00        Scheduled Meds: . aspirin EC  81 mg Oral Daily  . atorvastatin  40 mg Oral Daily  . budesonide (PULMICORT) nebulizer solution  0.5 mg Nebulization BID  . diltiazem  240 mg Oral Daily  . divalproex  1,000 mg Oral QHS  . enoxaparin (LOVENOX) injection  40 mg Subcutaneous Q24H  . esomeprazole  40 mg Oral BID AC  . famotidine  10 mg Oral Daily  . feeding supplement (GLUCERNA SHAKE)  237 mL Oral BID BM  . guaiFENesin  600 mg Oral BID  . hydrALAZINE  25 mg Oral BID  . insulin aspart  0-20 Units Subcutaneous TID WC  . insulin aspart  0-5 Units Subcutaneous QHS  . insulin glargine  15 Units Subcutaneous Daily  . ipratropium-albuterol  3 mL Nebulization Q4H WA  . loratadine  10 mg Oral Daily  . methylPREDNISolone (SOLU-MEDROL) injection  80 mg Intravenous TID  . nebivolol  5 mg Oral Daily  . torsemide  20 mg Oral Daily   Continuous Infusions:    LOS: 3 days    Time spent: 35 minutes.     Elmarie Shiley, MD [facility] Pager [phone]  If 7PM-7AM, please contact night-coverage [email or web address] Password [facility] 10/09/2017, 7:41 AM

## 2017-10-09 NOTE — Progress Notes (Signed)
Patient's hospital computer chart and our office computer chart reviewed and case discussed with the pulmonary team unfortunately she is at fluoroscopy for her barium swallow and an not in her room but if barium swallow was okay and she might need either a speech therapy evaluation for possible aspiration or a motility medicine like Reglan or erythromycin and I will check on tomorrow and full note to follow

## 2017-10-09 NOTE — Progress Notes (Addendum)
Pulmonary medicine follow-up  Brief. 67 year old remote smoker quit in 2005.  Admitted 6/7 with asthmatic exacerbation.  Had sick contact with grandchild prior.  Baseline PFTs with normal spirometry May 2019.  Subjective Feels a little better.  Still having active reflux in spite of aggressive therapy Objective Blood Pressure (Abnormal) 147/88 (BP Location: Right Arm)   Pulse 82   Temperature (Abnormal) 97.5 F (36.4 C) (Oral)   Respiration 18   Height 5\' 2"  (1.575 m)   Weight 172 lb 6.4 oz (78.2 kg)   Last Menstrual Period  (LMP Unknown)   Oxygen Saturation 98%   Body Mass Index 31.53 kg/m   Intake/Output Summary (Last 24 hours) at 10/09/2017 3818 Last data filed at 10/09/2017 0600 Gross per 24 hour  Intake 1460 ml  Output 1450 ml  Net 10 ml    Physical exam General: This is a 67 year old African-American female she is up in the chair and in no acute distress however does exhibit remarkable audible upper airway wheeze HEENT normocephalic atraumatic mucous membranes moist posterior pharynx without exudate loud audible upper airway wheeze Pulmonary: Essentially clear no accessory use audible wheeze translocated from upper airway Cardiac: Regular rate and rhythm without murmur rub or gallop Abdomen: Soft nontender no organomegaly Extremities: Brisk cap refill no edema Neuro: Awake oriented Psych: Awake, calm, appropriate CBC Recent Labs    10/07/17 0259 10/08/17 0333 10/09/17 0731  WBC 17.6* 18.0* 15.4*  HGB 9.5* 9.7* 9.9*  HCT 29.3* 30.6* 31.1*  PLT 274 287 289    Coag's No results for input(s): APTT, INR in the last 72 hours.  BMET Recent Labs    10/07/17 0259 10/08/17 0333 10/09/17 0731  NA 136 136 134*  K 4.3 4.3 4.0  CL 101 100* 97*  CO2 24 26 27   BUN 35* 39* 46*  CREATININE 1.39* 1.37* 1.60*  GLUCOSE 248* 251* 292*    Electrolytes Recent Labs    10/07/17 0259 10/08/17 0333 10/09/17 0731  CALCIUM 8.1* 7.7* 7.3*    Sepsis Markers No results  for input(s): PROCALCITON, O2SATVEN in the last 72 hours.  Invalid input(s): LACTICACIDVEN  ABG No results for input(s): PHART, PCO2ART, PO2ART in the last 72 hours.  Liver Enzymes No results for input(s): AST, ALT, ALKPHOS, BILITOT, ALBUMIN in the last 72 hours.  Cardiac Enzymes Recent Labs    10/06/17 1414  TROPONINI <0.03    Glucose Recent Labs    10/07/17 2146 10/08/17 0615 10/08/17 1117 10/08/17 1703 10/08/17 2117 10/09/17 0603  GLUCAP 303* 230* 291* 202* 382* 341*    Imaging Dg Chest Port 1 View  Result Date: 10/09/2017 CLINICAL DATA:  Shortness of breath.  Wheezing this morning. EXAM: PORTABLE CHEST 1 VIEW COMPARISON:  Frontal and lateral views 4 days ago 10/05/2017. Additional priors FINDINGS: Lower lung volumes from prior exam leading to bronchovascular crowding. Similar mild cardiomegaly and unchanged mediastinal contours. Mild left greater than right basilar atelectasis. No confluent airspace disease. No large pleural effusion or pneumothorax. Unchanged osseous structures. IMPRESSION: Hypoventilatory chest. Similar cardiomegaly and bibasilar atelectasis allowing for differences in technique. Electronically Signed   By: Jeb Levering M.D.   On: 10/09/2017 06:00    Impression/plan  Asthmatic exacerbation with purulent bronchitis; seemingly further complicated by significant component of laryngal pharyngeal reflux disease -Respiratory viral panel was negative -Sputum culture still pending -Afebrile, white blood cell curve improved -Majority of her symptoms are upper airway at this point, her lower airway is clear on exam -She continues to have significant  acid reflux in spite of being on twice daily Nexium as well as time Pepcid  Plan/recommendation Day #4 of 5 azithromycin; unless sputum culture points [place] a different direction Continue scheduled bronchodilators Change Solu-Medrol to prednisone 40 begin today, slow taper. Mobilize Reflux  precautions Continuing Nexium and Pepcid Will obtain barium swallow, she has had prior esophageal stricture requiring dilation, this could be contributing to reflux which is affecting her recovery Will reach out to GI, she seemingly maximized on reflux therapy, we need to get this under control if we hope to improve her upper airway irritation  Chronic diastolic heart failure, hypertension: Seems compensated at this point Plan Continue current medical management  CKD Plan Per internal medicine.  Diabetes with hyperglycemia Plan Sliding scale insulin   Erick Colace ACNP-BC [place] Pager # [phone] OR # [phone] if no answer

## 2017-10-09 NOTE — Care Management Important Message (Signed)
Important Message  Patient Details  Name: [name] MRN: [id number] Date of Birth: 1950/10/08   Medicare Important Message Given:  Yes    Barb Merino [place] 10/09/2017, 3:50 PM

## 2017-10-09 NOTE — Progress Notes (Signed)
Inpatient Diabetes Program Recommendations  AACE/ADA: New Consensus Statement on Inpatient Glycemic Control (2015)  Target Ranges:  Prepandial:   less than 140 mg/dL      Peak postprandial:   less than 180 mg/dL (1-2 hours)      Critically ill patients:  140 - 180 mg/dL   Lab Results  Component Value Date   GLUCAP 300 (H) 10/09/2017   HGBA1C 7.3 (H) 10/06/2017    Review of Glycemic Control  Diabetes history: DM2 Outpatient Diabetes medications: metformin 500 mg bid Current orders for Inpatient glycemic control: Lantus 15 units QD, Novolog 0-20 units tidwc and hs  Blood sugars past 24H - 341, 292, 300 HgbA1C acceptable. Hyperglycemia likely d/t steroids.   Inpatient Diabetes Program Recommendations:     Increase Lantus to 17 units QD Add Novolog 3 units tidwc for meal coverage insulin.  Will continue to follow.  Thank you. Lorenda Peck, RD, LDN, CDE Inpatient Diabetes Coordinator [phone]

## 2017-10-09 NOTE — Progress Notes (Signed)
Resp. Assessing patient and feels she may need a PCXR now last time was about 4 days ago. She cont. To have audible wheezing that sounds wet  at times and using her accessory muscles. O2 at 2 l/m n.c. 100% R.N. Aware and text page  Bodenheimer N.P.

## 2017-10-10 ENCOUNTER — Encounter (HOSPITAL_COMMUNITY): Payer: Self-pay | Admitting: Gastroenterology

## 2017-10-10 LAB — GLUCOSE, CAPILLARY
GLUCOSE-CAPILLARY: 236 mg/dL — AB (ref 65–99)
GLUCOSE-CAPILLARY: 184 mg/dL — AB (ref 65–99)
Glucose-Capillary: 243 mg/dL — ABNORMAL HIGH (ref 65–99)
Glucose-Capillary: 294 mg/dL — ABNORMAL HIGH (ref 65–99)

## 2017-10-10 LAB — CULTURE, RESPIRATORY: CULTURE: NORMAL

## 2017-10-10 LAB — BASIC METABOLIC PANEL
Anion gap: 8 (ref 5–15)
BUN: 35 mg/dL — ABNORMAL HIGH (ref 6–20)
CALCIUM: 7.4 mg/dL — AB (ref 8.9–10.3)
CO2: 30 mmol/L (ref 22–32)
CREATININE: 1.17 mg/dL — AB (ref 0.44–1.00)
Chloride: 99 mmol/L — ABNORMAL LOW (ref 101–111)
GFR, EST AFRICAN AMERICAN: 54 mL/min — AB (ref >60)
GFR, EST NON AFRICAN AMERICAN: 47 mL/min — AB (ref >60)
Glucose, Bld: 227 mg/dL — ABNORMAL HIGH (ref 65–99)
Potassium: 4.3 mmol/L (ref 3.5–5.1)
SODIUM: 137 mmol/L (ref 135–145)

## 2017-10-10 LAB — CULTURE, RESPIRATORY W GRAM STAIN

## 2017-10-10 MED ORDER — METOCLOPRAMIDE HCL 5 MG PO TABS
5.0000 mg | ORAL_TABLET | Freq: Three times a day (TID) | ORAL | Status: DC
  Administered 2017-10-11 (×3): 5 mg via ORAL
  Filled 2017-10-10 (×3): qty 1

## 2017-10-10 MED ORDER — INSULIN GLARGINE 100 UNIT/ML ~~LOC~~ SOLN
20.0000 [IU] | Freq: Every day | SUBCUTANEOUS | Status: DC
  Administered 2017-10-11 – 2017-10-15 (×5): 20 [IU] via SUBCUTANEOUS
  Filled 2017-10-10 (×5): qty 0.2

## 2017-10-10 NOTE — Progress Notes (Signed)
Pulmonary medicine follow-up  Brief. 67 year old remote smoker quit in 2005.  Admitted 6/7 with asthmatic exacerbation.  Had sick contact with grandchild prior.  Baseline PFTs with normal spirometry May 2019.  Subjective Feeling about the same per family and RN.   GI note reviewed    Objective BP (!) 170/81 (BP Location: Right Arm)   Pulse 80   Temp 97.8 F (36.6 C) (Oral)   Resp 20   Ht 5\' 2"  (1.575 m)   Wt 79 kg (174 lb 3.2 oz)   LMP  (LMP Unknown)   SpO2 100%   BMI 31.86 kg/m   Intake/Output Summary (Last 24 hours) at 10/10/2017 1448 Last data filed at 10/10/2017 0816 Gross per 24 hour  Intake 1200 ml  Output 1950 ml  Net -750 ml    Physical exam Unable - pt in bathroom for extended period when I attempted to see her x2.    CBC Recent Labs    10/08/17 0333 10/09/17 0731  WBC 18.0* 15.4*  HGB 9.7* 9.9*  HCT 30.6* 31.1*  PLT 287 289    Coag's No results for input(s): APTT, INR in the last 72 hours.  BMET Recent Labs    10/08/17 0333 10/09/17 0731 10/10/17 0732  NA 136 134* 137  K 4.3 4.0 4.3  CL 100* 97* 99*  CO2 26 27 30   BUN 39* 46* 35*  CREATININE 1.37* 1.60* 1.17*  GLUCOSE 251* 292* 227*    Electrolytes Recent Labs    10/08/17 0333 10/09/17 0731 10/10/17 0732  CALCIUM 7.7* 7.3* 7.4*    Sepsis Markers No results for input(s): PROCALCITON, O2SATVEN in the last 72 hours.  Invalid input(s): LACTICACIDVEN  ABG No results for input(s): PHART, PCO2ART, PO2ART in the last 72 hours.  Liver Enzymes No results for input(s): AST, ALT, ALKPHOS, BILITOT, ALBUMIN in the last 72 hours.  Cardiac Enzymes No results for input(s): TROPONINI, PROBNP in the last 72 hours.  Glucose Recent Labs    10/09/17 0603 10/09/17 1143 10/09/17 1658 10/09/17 2115 10/10/17 0554 10/10/17 1113  GLUCAP 341* 300* 377* 290* 243* 236*    Imaging Dg Esophagus  Result Date: 10/09/2017 CLINICAL DATA:  Shortness of breath, bronchitis, wheezing, asthma  exacerbation. Heartburn. Occasionally feels that solids are slow to go down. EXAM: ESOPHOGRAM / BARIUM SWALLOW / BARIUM TABLET STUDY TECHNIQUE: Combined double contrast and single contrast examination performed using effervescent crystals, thick barium liquid, and thin barium liquid. The patient was observed with fluoroscopy swallowing a 13 mm barium sulphate tablet. FLUOROSCOPY TIME:  Fluoroscopy Time:  1 minutes 18 seconds Radiation Exposure Index (if provided by the fluoroscopic device): Number of Acquired Spot Images: 0 COMPARISON:  None. FINDINGS: Laryngeal penetration with swallowing, no aspiration. Esophageal motility is within normal limits. No esophageal fold thickening, stricture or obstruction. A 13 mm barium tablet passed into the stomach without difficulty. IMPRESSION: 1. Low range of penetration without aspiration. Otherwise, normal exam. Electronically Signed   By: Lorin Picket M.D.   On: 10/09/2017 14:20   Dg Chest Port 1 View  Result Date: 10/09/2017 CLINICAL DATA:  Shortness of breath.  Wheezing this morning. EXAM: PORTABLE CHEST 1 VIEW COMPARISON:  Frontal and lateral views 4 days ago 10/05/2017. Additional priors FINDINGS: Lower lung volumes from prior exam leading to bronchovascular crowding. Similar mild cardiomegaly and unchanged mediastinal contours. Mild left greater than right basilar atelectasis. No confluent airspace disease. No large pleural effusion or pneumothorax. Unchanged osseous structures. IMPRESSION: Hypoventilatory chest. Similar cardiomegaly and bibasilar  atelectasis allowing for differences in technique. Electronically Signed   By: Jeb Levering M.D.   On: 10/09/2017 06:00    Impression/plan  Asthmatic exacerbation with purulent bronchitis; seemingly further complicated by significant component of laryngal pharyngeal reflux disease -Respiratory viral panel was negative -Sputum culture with normal flora -Afebrile, white blood cell curve improved -Majority of  her symptoms are upper airway at this point, her lower airway is clear on exam -She continues to have significant acid reflux in spite of being on twice daily Nexium as well as time Pepcid  Plan/recommendation Day #5 of 5 azithromycin Continue scheduled bronchodilators Continue prednisone with slow taper. Mobilize Reflux precautions Continuing Nexium and Pepcid Consider additional reglan per GI recs  Barium swallow ess wnl  Consider gastric emptying study or capsule -- will need further outpt GI w/u   Chronic diastolic heart failure, hypertension: Seems compensated at this point Plan Continue current medical management  CKD Plan Per internal medicine.  Diabetes with hyperglycemia Plan Sliding scale insulin   Nickolas Madrid, NP 10/10/2017  2:48 PM Pager: [phone] or [phone]

## 2017-10-10 NOTE — Consult Note (Signed)
Reason for Consult:reflux questionable dysphasia Referring Physician: pulmonary team  [name] is an 67 y.o. female.  HPI: patient seen and examined and our office computer chart reviewed and her hospital computer chart reviewed and case discussed with the pulmonary team and by my history she does not have dysphagia she swallows liquids and solids fine and she feels phlegm in her throat and cannot cough it up and that is what causes her to choke and she has continued to wheeze and this is the worse it's beenand she really does not feel much gastric reflux by my history and she has no other systemic complaints and she does cough a lot but has been moving her bowels and her barium swallow was reviewed  Past Medical History:  Diagnosis Date  . Anxiety   . Asthma   . Bronchitis   . Chronic diastolic CHF (congestive heart failure) ([place])   . Chronic diastolic CHF (congestive heart failure) ([place]) 07/17/2016  . Coronary artery disease    a. NSTEMI 12/2015 - LHC 01/18/16: s/p overlapping DESx2 to RCA, 10% ost-prox Cx,10% mLAD.  [place] Kitchen Depression   . Diabetes mellitus   . Dyslipidemia   . GERD (gastroesophageal reflux disease)   . Heart attack ([place])   . Hypertension   . Hypertensive heart disease   . NSTEMI (non-ST elevated myocardial infarction) ([place]) 12/2015  . Pneumonia     Past Surgical History:  Procedure Laterality Date  . ABDOMINAL HYSTERECTOMY    . CARDIAC CATHETERIZATION  1994   minimal LAD dz, no other dz, EF normal  . CARDIAC CATHETERIZATION N/A 01/18/2016   Procedure: Left Heart Cath and Coronary Angiography;  Surgeon: Burnell Blanks, MD;  Location: [facility];  Service: Cardiovascular;  Laterality: N/A;  . CARDIAC CATHETERIZATION N/A 01/18/2016   Procedure: Coronary Stent Intervention;  Surgeon: Burnell Blanks, MD;  Location: [facility];  Service: Cardiovascular;  Laterality: N/A;  . COLONOSCOPY WITH PROPOFOL N/A 04/09/2014   Procedure: COLONOSCOPY  WITH PROPOFOL;  Surgeon: Cleotis Nipper, MD;  Location: [facility];  Service: Endoscopy;  Laterality: N/A;  . CORONARY STENT PLACEMENT  01/19/2016   STENT SYNERGY DES 4.76L46 drug eluting stent was successfully placed, and overlaps the 2.5 x 38 mm Synergy stent placed distally.  . ESOPHAGOGASTRODUODENOSCOPY (EGD) WITH PROPOFOL N/A 04/09/2014   Procedure: ESOPHAGOGASTRODUODENOSCOPY (EGD) WITH PROPOFOL;  Surgeon: Cleotis Nipper, MD;  Location: [facility];  Service: Endoscopy;  Laterality: N/A;  . HEMORRHOID SURGERY    . HERNIA REPAIR    . KNEE ARTHROSCOPY      Family History  Problem Relation Age of Onset  . Allergies Mother   . Asthma Mother   . CAD Father 78  . Diabetes Father   . Hypertension Father   . Congestive Heart Failure Sister        died in her 16s  . CAD Sister 32    Social History:  reports that she quit smoking about 13 years ago. Her smoking use included cigarettes. She has a 30.00 pack-year smoking history. She has never used smokeless tobacco. She reports that she does not drink alcohol or use drugs.  Allergies:  Allergies  Allergen Reactions  . Amlodipine Besylate Other (See Comments)    Reaction unknown  . Lisinopril Other (See Comments)    Reaction unknown  . Pantoprazole Sodium Other (See Comments)    Reaction unknown    Medications: I have reviewed the patient's current medications.  Results for  orders placed or performed during the hospital encounter of 10/05/17 (from the past 48 hour(s))  Culture, expectorated sputum-assessment     Status: None   Collection Time: 10/08/17 12:15 PM  Result Value Ref Range   Specimen Description SPUTUM    Special Requests NONE    Sputum evaluation      THIS SPECIMEN IS ACCEPTABLE FOR SPUTUM CULTURE Performed at [facility], [place] [address] [place], [place] [address]    Report Status 10/08/2017 FINAL   Culture, respiratory (NON-Expectorated)     Status: None (Preliminary result)   Collection Time:  10/08/17 12:15 PM  Result Value Ref Range   Specimen Description SPUTUM    Special Requests NONE Reflexed from [id number]    Gram Stain      RARE WBC PRESENT, PREDOMINANTLY PMN RARE SQUAMOUS EPITHELIAL CELLS PRESENT MODERATE GRAM POSITIVE COCCI IN PAIRS IN CHAINS IN CLUSTERS RARE GRAM VARIABLE ROD    Culture      CULTURE REINCUBATED FOR BETTER GROWTH Performed at [facility], [place] [address], [place], [place] [address]    Report Status PENDING   Glucose, capillary     Status: Abnormal   Collection Time: 10/08/17  5:03 PM  Result Value Ref Range   Glucose-Capillary 202 (H) 65 - 99 mg/dL  Glucose, capillary     Status: Abnormal   Collection Time: 10/08/17  9:17 PM  Result Value Ref Range   Glucose-Capillary 382 (H) 65 - 99 mg/dL  Glucose, capillary     Status: Abnormal   Collection Time: 10/09/17  6:03 AM  Result Value Ref Range   Glucose-Capillary 341 (H) 65 - 99 mg/dL  CBC     Status: Abnormal   Collection Time: 10/09/17  7:31 AM  Result Value Ref Range   WBC 15.4 (H) 4.0 - 10.5 K/uL   RBC 3.18 (L) 3.87 - 5.11 MIL/uL   Hemoglobin 9.9 (L) 12.0 - 15.0 g/dL   HCT 31.1 (L) 36.0 - 46.0 %   MCV 97.8 78.0 - 100.0 fL   MCH 31.1 26.0 - 34.0 pg   MCHC 31.8 30.0 - 36.0 g/dL   RDW 13.9 11.5 - 15.5 %   Platelets 289 150 - 400 K/uL    Comment: Performed at [facility], [place] [address], [place], [place] [address]  Basic metabolic panel     Status: Abnormal   Collection Time: 10/09/17  7:31 AM  Result Value Ref Range   Sodium 134 (L) 135 - 145 mmol/L   Potassium 4.0 3.5 - 5.1 mmol/L   Chloride 97 (L) 101 - 111 mmol/L   CO2 27 22 - 32 mmol/L   Glucose, Bld 292 (H) 65 - 99 mg/dL   BUN 46 (H) 6 - 20 mg/dL   Creatinine, Ser 1.60 (H) 0.44 - 1.00 mg/dL   Calcium 7.3 (L) 8.9 - 10.3 mg/dL   GFR calc non Af Amer 32 (L) >60 mL/min   GFR calc Af Amer 37 (L) >60 mL/min    Comment: (NOTE) The eGFR has been calculated using the CKD EPI equation. This calculation has not been  validated in all clinical situations. eGFR's persistently <60 mL/min signify possible Chronic Kidney Disease.    Anion gap 10 5 - 15    Comment: Performed at [facility] [address], [place], [place] [address]  Glucose, capillary     Status: Abnormal   Collection Time: 10/09/17 11:43 AM  Result Value Ref Range  Glucose-Capillary 300 (H) 65 - 99 mg/dL  Glucose, capillary     Status: Abnormal   Collection Time: 10/09/17  4:58 PM  Result Value Ref Range   Glucose-Capillary 377 (H) 65 - 99 mg/dL  Glucose, capillary     Status: Abnormal   Collection Time: 10/09/17  9:15 PM  Result Value Ref Range   Glucose-Capillary 290 (H) 65 - 99 mg/dL  Glucose, capillary     Status: Abnormal   Collection Time: 10/10/17  5:54 AM  Result Value Ref Range   Glucose-Capillary 243 (H) 65 - 99 mg/dL  Basic metabolic panel     Status: Abnormal   Collection Time: 10/10/17  7:32 AM  Result Value Ref Range   Sodium 137 135 - 145 mmol/L   Potassium 4.3 3.5 - 5.1 mmol/L   Chloride 99 (L) 101 - 111 mmol/L   CO2 30 22 - 32 mmol/L   Glucose, Bld 227 (H) 65 - 99 mg/dL   BUN 35 (H) 6 - 20 mg/dL   Creatinine, Ser 1.17 (H) 0.44 - 1.00 mg/dL   Calcium 7.4 (L) 8.9 - 10.3 mg/dL   GFR calc non Af Amer 47 (L) >60 mL/min   GFR calc Af Amer 54 (L) >60 mL/min    Comment: (NOTE) The eGFR has been calculated using the CKD EPI equation. This calculation has not been validated in all clinical situations. eGFR's persistently <60 mL/min signify possible Chronic Kidney Disease.    Anion gap 8 5 - 15    Comment: Performed at [facility] [address] [place], [place] [address]  Glucose, capillary     Status: Abnormal   Collection Time: 10/10/17 11:13 AM  Result Value Ref Range   Glucose-Capillary 236 (H) 65 - 99 mg/dL    Dg Esophagus  Result Date: 10/09/2017 CLINICAL DATA:  Shortness of breath, bronchitis, wheezing, asthma exacerbation. Heartburn. Occasionally feels that solids are slow to go down.  EXAM: ESOPHOGRAM / BARIUM SWALLOW / BARIUM TABLET STUDY TECHNIQUE: Combined double contrast and single contrast examination performed using effervescent crystals, thick barium liquid, and thin barium liquid. The patient was observed with fluoroscopy swallowing a 13 mm barium sulphate tablet. FLUOROSCOPY TIME:  Fluoroscopy Time:  1 minutes 18 seconds Radiation Exposure Index (if provided by the fluoroscopic device): Number of Acquired Spot Images: 0 COMPARISON:  None. FINDINGS: Laryngeal penetration with swallowing, no aspiration. Esophageal motility is within normal limits. No esophageal fold thickening, stricture or obstruction. A 13 mm barium tablet passed into the stomach without difficulty. IMPRESSION: 1. Low range of penetration without aspiration. Otherwise, normal exam. Electronically Signed   By: Lorin Picket M.D.   On: 10/09/2017 14:20   Dg Chest Port 1 View  Result Date: 10/09/2017 CLINICAL DATA:  Shortness of breath.  Wheezing this morning. EXAM: PORTABLE CHEST 1 VIEW COMPARISON:  Frontal and lateral views 4 days ago 10/05/2017. Additional priors FINDINGS: Lower lung volumes from prior exam leading to bronchovascular crowding. Similar mild cardiomegaly and unchanged mediastinal contours. Mild left greater than right basilar atelectasis. No confluent airspace disease. No large pleural effusion or pneumothorax. Unchanged osseous structures. IMPRESSION: Hypoventilatory chest. Similar cardiomegaly and bibasilar atelectasis allowing for differences in technique. Electronically Signed   By: Jeb Levering M.D.   On: 10/09/2017 06:00    ROSnegative except above Blood pressure (!) 170/81, pulse 80, temperature 97.8 F (36.6 C), temperature source Oral, resp. rate 20, height 5' 2"  (1.575 m), weight 79 kg (174 lb 3.2 oz), SpO2 100 %.  Physical Exam vital signs stable afebrile obviously wheezing abdomen is soft nontender occasional bowel sounds barium swallow barium tablet normallabs stable chronic  anemia Assessment/Plan: Multiple medical problems currently with significant asthma Plan: Consider in the future a gastric emptying study or even a 24-hour pH or bravo capsule particularly if reflux is still considered to playing a role and my partner Dr. Cristina Gong is happy to see her back for that in the meantime consider speech therapy and a oropharyngeal swallowing study although she says she has no problems with food and also consider a motility agent like erythromycin or Reglan if you continue to think reflux is playing a role otherwise please call me this week if I can be of any further assistance her any other questions I could possibly help with  [facility] E 10/10/2017, 11:37 AM

## 2017-10-10 NOTE — Progress Notes (Signed)
Nutrition Follow Up  DOCUMENTATION CODES:   Obesity unspecified  INTERVENTION:    Glucerna Shake po BID, each supplement provides 220 kcal and 10 grams of protein  NEW NUTRITION DIAGNOSIS:   Increased nutrient needs related to chronic illness as evidenced by estimated needs, ongoing  GOAL:   Patient will meet greater than or equal to 90% of their needs, met  MONITOR:   PO intake, Supplement acceptance, Labs, Weight trends, I & O's  ASSESSMENT:   67 y.o. female with medical history significant of asthma type 2 diabetes, hypertension, hyperlipidemia, pulmonary emboli and, depression, , coronary artery disease, stent placement following a heart attack in January 18 2016, seizure disorder with occasional episodes of confusion, chronic diastolic CHF, HLD, GERD, HTN. Pt admitted for acute bronchitis and asthma exacerbation    Pt continues on a CHO Modified diet. PO intake good at 100% per flowsheet records. Receiving Glucerna Shake supplements BID.  GI note 6/12 reviewed. Swallow evaluation ordered. Labs and medications reviewed. CBG's [phone].  Diet Order:   Diet Order           Diet Carb Modified Fluid consistency: Thin; Room service appropriate? Yes  Diet effective now         EDUCATION NEEDS:   Education needs have been addressed  Skin:  Skin Assessment: Reviewed RN Assessment  Last BM:  6/11  Height:   Ht Readings from Last 1 Encounters:  10/06/17 5' 2"  (1.575 m)    Weight:   Wt Readings from Last 1 Encounters:  10/10/17 174 lb 3.2 oz (79 kg)    Ideal Body Weight:  50 kg  BMI:  Body mass index is 31.86 kg/m.  Estimated Nutritional Needs:   Kcal:  1500-1700  Protein:  75-90 gm  Fluid:  1.5-1.7 L  Arthur Holms, RD, LDN Pager #: [phone] After-Hours Pager #: [phone]

## 2017-10-10 NOTE — Progress Notes (Signed)
PROGRESS NOTE    [name]  KDT:[id number] DOB: May 27, 1950 DOA: 10/05/2017 PCP: [name], MD    Brief Narrative:  [name] is a 67 y.o. female with medical history significant of asthma, type 2 diabetes, hypertension, hyperlipidemia, pulmonary emboli, depression, coronary artery disease, stent placement following a heart attack in September 2017, seizure disorder with occasional episodes of confusion, chronic diastolic CHF who presented with URI with cough, dyspnea, chest tightness. She has been using her nebulizer treatment and prednisone without improvement. She has sick contact.  Admitted for acute asthma exacerbation, slow to resolve and hence PCCM consulted.  [facility] consulted for LPR.  Assessment & Plan:   Principal Problem:   Asthma exacerbation Active Problems:   Depression with anxiety   Essential hypertension   G E R D   PULMONARY EMBOLISM, HX OF   Diabetes mellitus type 2, controlled ([place])   Acute bronchitis with asthma with acute exacerbation   Chronic diastolic CHF (congestive heart failure) (HCC)   Macrocytic anemia  Acute asthma exacerbation RSV panel & d-dimer negative. Treated with oxygen, IV Solu-Medrol, duo nebs, Pulmicort.  Received IV magnesium Despite treatment, continued to have significant wheezing.  CCM consulted and follow-up appreciated.  They suspect that acid reflux is playing a big component now with her persistent symptoms.   Now on oral prednisone.  Complete course of azithromycin.  Acute Respiratory failure with hypoxia;  Related to asthma exacerbation.  Nebulizer, oxygen.  Titrate oxygen as needed.  Anemia;  Anemia panel; iron 72, ferritin 135 B-12: 589. Might need iron trial.  Hb stable.   CAD;  Continue with aspirin, lipitor, Bystolic.  Asymptomatic of ischemic symptoms.  Chest pain;  Enzymes; negative.  D dimer negative.  Related to asthma exacerbation.  Resolved.  Acute on chronic Diastolic HF;  Received IV Lasix  earlier on.  Remains on torsemide.  +1.2 L since admission, not sure if accurate.  Weight also up by 9 pounds but not clinically very volume overloaded.  History of seizure;  Continue with Depakote.   DM; type 2.  Uncontrolled despite Lantus and SSI.  Likely related to steroids.  May need to adjust insulins as needed.  AKI;  Resolved.  GERD -  [facility] input appreciated and recommending considering GES or 24-hour pH probe as outpatient, consider erythromycin or Reglan.  Continue Pepcid, Maalox and Nexium. - MBS report appreciated.  Speech therapy consulted for dietary recommendations. -I discussed in detail with patient regarding options of erythromycin or Reglan, reason to initiate, side effect profile.  She was willing to try Reglan.   DVT prophylaxis: Lovenox Code Status: full code. Per nurse report, patient wants to be full code.  Family Communication: Discussed in detail with patient's daughter at bedside..   Disposition Plan: home when stable.   Consultants:   CCM  [facility]   Procedures:   Doppler LE.   Antimicrobials:  Azithromycin   Subjective: Ongoing wheezing.  Not much cough.  No chest pain.  Indicates that her 31-year-old grandson was sick before she got sick.  Objective: Vitals:   10/10/17 1115 10/10/17 1208 10/10/17 1554 10/10/17 1615  BP: (!) 170/81   140/71  Pulse: 80   77  Resp:      Temp: 97.8 F (36.6 C)   97.9 F (36.6 C)  TempSrc: Oral   Oral  SpO2: 100% 100% 100% 100%  Weight:      Height:        Intake/Output Summary (Last  24 hours) at 10/10/2017 1804 Last data filed at 10/10/2017 1200 Gross per 24 hour  Intake 1300 ml  Output 1350 ml  Net -50 ml   Filed Weights   10/08/17 0524 10/09/17 0408 10/10/17 0334  Weight: 76.1 kg (167 lb 12.8 oz) 78.2 kg (172 lb 6.4 oz) 79 kg (174 lb 3.2 oz)    Examination:  General exam: Pleasant middle-aged female, moderately built and overweight sitting up in bed with audible wheezing from the door of  her room. Respiratory system: Predominantly upper airway wheezing but no stridor.  No increased work of breathing. Cardiovascular system: S1 and S2 heard, RRR.  No JVD, murmurs or pedal edema.  Telemetry personally reviewed and shows sinus rhythm. Gastrointestinal system: Abdomen is nondistended, soft and nontender.  Normal bowel sounds heard. Central nervous system: Alert and oriented.  No focal neurological deficits. Extremities: symmetric power.  Skin: no rash      Data Reviewed: I have personally reviewed following labs and imaging studies  CBC: Recent Labs  Lab 10/05/17 1625 10/06/17 0915 10/07/17 0259 10/08/17 0333 10/09/17 0731  WBC 7.2 9.6 17.6* 18.0* 15.4*  NEUTROABS 4.5  --   --   --   --   HGB 10.2* 10.2* 9.5* 9.7* 9.9*  HCT 32.7* 32.0* 29.3* 30.6* 31.1*  MCV 100.0 97.9 97.3 98.1 97.8  PLT 265 297 274 287 426   Basic Metabolic Panel: Recent Labs  Lab 10/06/17 0915 10/07/17 0259 10/08/17 0333 10/09/17 0731 10/10/17 0732  NA 135 136 136 134* 137  K 3.5 4.3 4.3 4.0 4.3  CL 102 101 100* 97* 99*  CO2 18* 24 26 27 30   GLUCOSE 365* 248* 251* 292* 227*  BUN 23* 35* 39* 46* 35*  CREATININE 1.30* 1.39* 1.37* 1.60* 1.17*  CALCIUM 8.6* 8.1* 7.7* 7.3* 7.4*  MG 1.7  --   --   --   --   PHOS 2.9  --   --   --   --    GFR: Estimated Creatinine Clearance: 44.8 mL/min (A) (by C-G formula based on SCr of 1.17 mg/dL (H)). Liver Function Tests: Recent Labs  Lab 10/06/17 0915  AST 22  ALT 17  ALKPHOS 48  BILITOT 0.5  PROT 7.2  ALBUMIN 3.4*   Cardiac Enzymes: Recent Labs  Lab 10/06/17 0253 10/06/17 0915 10/06/17 1414  TROPONINI <0.03 <0.03 <0.03   CBG: Recent Labs  Lab 10/09/17 1658 10/09/17 2115 10/10/17 0554 10/10/17 1113 10/10/17 1617  GLUCAP 377* 290* 243* 236* 184*     Recent Results (from the past 240 hour(s))  Respiratory Panel by PCR     Status: None   Collection Time: 10/07/17  1:18 PM  Result Value Ref Range Status   Adenovirus NOT  DETECTED NOT DETECTED Final   Coronavirus 229E NOT DETECTED NOT DETECTED Final   Coronavirus HKU1 NOT DETECTED NOT DETECTED Final   Coronavirus NL63 NOT DETECTED NOT DETECTED Final   Coronavirus OC43 NOT DETECTED NOT DETECTED Final   Metapneumovirus NOT DETECTED NOT DETECTED Final   Rhinovirus / Enterovirus NOT DETECTED NOT DETECTED Final   Influenza A NOT DETECTED NOT DETECTED Final   Influenza B NOT DETECTED NOT DETECTED Final   Parainfluenza Virus 1 NOT DETECTED NOT DETECTED Final   Parainfluenza Virus 2 NOT DETECTED NOT DETECTED Final   Parainfluenza Virus 3 NOT DETECTED NOT DETECTED Final   Parainfluenza Virus 4 NOT DETECTED NOT DETECTED Final   Respiratory Syncytial Virus NOT DETECTED NOT DETECTED Final  Bordetella pertussis NOT DETECTED NOT DETECTED Final   Chlamydophila pneumoniae NOT DETECTED NOT DETECTED Final   Mycoplasma pneumoniae NOT DETECTED NOT DETECTED Final    Comment: Performed at [facility], [place] [address], [place], [place] [address]  Culture, expectorated sputum-assessment     Status: None   Collection Time: 10/08/17 12:15 PM  Result Value Ref Range Status   Specimen Description SPUTUM  Final   Special Requests NONE  Final   Sputum evaluation   Final    THIS SPECIMEN IS ACCEPTABLE FOR SPUTUM CULTURE Performed at [facility], [place] [address] [place], [place] [address]    Report Status 10/08/2017 FINAL  Final  Culture, respiratory (NON-Expectorated)     Status: None   Collection Time: 10/08/17 12:15 PM  Result Value Ref Range Status   Specimen Description SPUTUM  Final   Special Requests NONE Reflexed from [id number]  Final   Gram Stain   Final    RARE WBC PRESENT, PREDOMINANTLY PMN RARE SQUAMOUS EPITHELIAL CELLS PRESENT MODERATE GRAM POSITIVE COCCI IN PAIRS IN CHAINS IN CLUSTERS RARE GRAM VARIABLE ROD    Culture   Final    MODERATE Consistent with normal respiratory flora. Performed at [facility], [place] [address], [place], [place]  [address]    Report Status 10/10/2017 FINAL  Final         Radiology Studies: Dg Esophagus  Result Date: 10/09/2017 CLINICAL DATA:  Shortness of breath, bronchitis, wheezing, asthma exacerbation. Heartburn. Occasionally feels that solids are slow to go down. EXAM: ESOPHOGRAM / BARIUM SWALLOW / BARIUM TABLET STUDY TECHNIQUE: Combined double contrast and single contrast examination performed using effervescent crystals, thick barium liquid, and thin barium liquid. The patient was observed with fluoroscopy swallowing a 13 mm barium sulphate tablet. FLUOROSCOPY TIME:  Fluoroscopy Time:  1 minutes 18 seconds Radiation Exposure Index (if provided by the fluoroscopic device): Number of Acquired Spot Images: 0 COMPARISON:  None. FINDINGS: Laryngeal penetration with swallowing, no aspiration. Esophageal motility is within normal limits. No esophageal fold thickening, stricture or obstruction. A 13 mm barium tablet passed into the stomach without difficulty. IMPRESSION: 1. Low range of penetration without aspiration. Otherwise, normal exam. Electronically Signed   By: Lorin Picket M.D.   On: 10/09/2017 14:20   Dg Chest Port 1 View  Result Date: 10/09/2017 CLINICAL DATA:  Shortness of breath.  Wheezing this morning. EXAM: PORTABLE CHEST 1 VIEW COMPARISON:  Frontal and lateral views 4 days ago 10/05/2017. Additional priors FINDINGS: Lower lung volumes from prior exam leading to bronchovascular crowding. Similar mild cardiomegaly and unchanged mediastinal contours. Mild left greater than right basilar atelectasis. No confluent airspace disease. No large pleural effusion or pneumothorax. Unchanged osseous structures. IMPRESSION: Hypoventilatory chest. Similar cardiomegaly and bibasilar atelectasis allowing for differences in technique. Electronically Signed   By: Jeb Levering M.D.   On: 10/09/2017 06:00        Scheduled Meds: . aspirin EC  81 mg Oral Daily  . atorvastatin  40 mg Oral Daily  .  budesonide (PULMICORT) nebulizer solution  0.5 mg Nebulization BID  . diltiazem  240 mg Oral Daily  . divalproex  1,000 mg Oral QHS  . enoxaparin (LOVENOX) injection  40 mg Subcutaneous Q24H  . esomeprazole  40 mg Oral BID AC  . famotidine  10 mg Oral Daily  . feeding supplement (GLUCERNA SHAKE)  237 mL Oral BID BM  . guaiFENesin  600 mg Oral BID  . hydrALAZINE  25  mg Oral BID  . insulin aspart  0-20 Units Subcutaneous TID WC  . insulin aspart  0-5 Units Subcutaneous QHS  . insulin glargine  15 Units Subcutaneous Daily  . ipratropium-albuterol  3 mL Nebulization Q4H WA  . loratadine  10 mg Oral Daily  . nebivolol  5 mg Oral Daily  . predniSONE  40 mg Oral Q breakfast  . torsemide  20 mg Oral Daily   Continuous Infusions:    LOS: 4 days    Time spent: 35 minutes.    Vernell Leep, MD, FACP, [facility]. [facility] Pager [phone]  If 7PM-7AM, please contact night-coverage [email or web address] Password [facility] 10/10/2017, 6:17 PM

## 2017-10-10 NOTE — Plan of Care (Signed)
  Problem: Education: Goal: Knowledge of General Education information will improve Outcome: Progressing   Problem: Clinical Measurements: Goal: Ability to maintain clinical measurements within normal limits will improve Outcome: Progressing Goal: Will remain free from infection Outcome: Progressing   

## 2017-10-11 LAB — GLUCOSE, CAPILLARY
GLUCOSE-CAPILLARY: 146 mg/dL — AB (ref 65–99)
GLUCOSE-CAPILLARY: 261 mg/dL — AB (ref 65–99)
Glucose-Capillary: 329 mg/dL — ABNORMAL HIGH (ref 65–99)
Glucose-Capillary: 219 mg/dL — ABNORMAL HIGH (ref 65–99)

## 2017-10-11 MED ORDER — TORSEMIDE 20 MG PO TABS
20.0000 mg | ORAL_TABLET | Freq: Once | ORAL | Status: AC
  Administered 2017-10-11: 20 mg via ORAL
  Filled 2017-10-11: qty 1

## 2017-10-11 MED ORDER — IPRATROPIUM-ALBUTEROL 0.5-2.5 (3) MG/3ML IN SOLN
3.0000 mL | Freq: Three times a day (TID) | RESPIRATORY_TRACT | Status: DC
  Administered 2017-10-12: 3 mL via RESPIRATORY_TRACT
  Filled 2017-10-11 (×3): qty 3

## 2017-10-11 NOTE — Progress Notes (Addendum)
Pulmonary medicine follow-up  Brief. 67 year old remote smoker quit in 2005.  Admitted 6/7 with asthmatic exacerbation.  Had sick contact with grandchild prior.  Baseline PFTs with normal spirometry May 2019.  Subjective Continues to feel better.  She is concerned about lower extremity with swelling Objective Blood Pressure (Abnormal) 156/74 (BP Location: Right Arm)   Pulse 73   Temperature (Abnormal) 97.3 F (36.3 C) (Oral)   Respiration 19   Height 5\' 2"  (1.575 m)   Weight 175 lb 3.2 oz (79.5 kg)   Last Menstrual Period  (LMP Unknown)   Oxygen Saturation 100%   Body Mass Index 32.04 kg/m   Intake/Output Summary (Last 24 hours) at 10/11/2017 1202 Last data filed at 10/11/2017 8657 Gross per 24 hour  Intake 720 ml  Output 450 ml  Net 270 ml    Physical exam General: 67 year old female patient currently ambulatory in the room.  She is in no acute distress.  Ultimately seems to be feeling better and upper airway noises less audible when sitting next to her. HEENT normocephalic atraumatic.  Still has upper airway wheezing but again this is now only audible with stethoscope.   Pulmonary: No accessory use.  No wheezing. Cardiac: Regular rate and rhythm Abdomen: Soft nontender Extremities: 2+ pedal edema otherwise warm, strong pulses.  Neuro: Awake oriented no focal deficits CBC Recent Labs    10/09/17 0731  WBC 15.4*  HGB 9.9*  HCT 31.1*  PLT 289    Coag's No results for input(s): APTT, INR in the last 72 hours.  BMET Recent Labs    10/09/17 0731 10/10/17 0732  NA 134* 137  K 4.0 4.3  CL 97* 99*  CO2 27 30  BUN 46* 35*  CREATININE 1.60* 1.17*  GLUCOSE 292* 227*    Electrolytes Recent Labs    10/09/17 0731 10/10/17 0732  CALCIUM 7.3* 7.4*    Sepsis Markers No results for input(s): PROCALCITON, O2SATVEN in the last 72 hours.  Invalid input(s): LACTICACIDVEN  ABG No results for input(s): PHART, PCO2ART, PO2ART in the last 72 hours.  Liver  Enzymes No results for input(s): AST, ALT, ALKPHOS, BILITOT, ALBUMIN in the last 72 hours.  Cardiac Enzymes No results for input(s): TROPONINI, PROBNP in the last 72 hours.  Glucose Recent Labs    10/10/17 0554 10/10/17 1113 10/10/17 1617 10/10/17 2058 10/11/17 0554 10/11/17 1150  GLUCAP 243* 236* 184* 294* 146* 261*    Imaging Dg Esophagus  Result Date: 10/09/2017 CLINICAL DATA:  Shortness of breath, bronchitis, wheezing, asthma exacerbation. Heartburn. Occasionally feels that solids are slow to go down. EXAM: ESOPHOGRAM / BARIUM SWALLOW / BARIUM TABLET STUDY TECHNIQUE: Combined double contrast and single contrast examination performed using effervescent crystals, thick barium liquid, and thin barium liquid. The patient was observed with fluoroscopy swallowing a 13 mm barium sulphate tablet. FLUOROSCOPY TIME:  Fluoroscopy Time:  1 minutes 18 seconds Radiation Exposure Index (if provided by the fluoroscopic device): Number of Acquired Spot Images: 0 COMPARISON:  None. FINDINGS: Laryngeal penetration with swallowing, no aspiration. Esophageal motility is within normal limits. No esophageal fold thickening, stricture or obstruction. A 13 mm barium tablet passed into the stomach without difficulty. IMPRESSION: 1. Low range of penetration without aspiration. Otherwise, normal exam. Electronically Signed   By: Lorin Picket M.D.   On: 10/09/2017 14:20    Impression/plan  Asthmatic exacerbation with purulent bronchitis; seemingly further complicated by significant component of laryngal pharyngeal reflux disease -Respiratory viral panel was negative -Sputum w/ NPF -completed abx -  still w/ reflux symptoms.  I believe this is a large contributing factor to her symptoms.  Appreciate GI involvement  Plan/recommendation Continue scheduled bronchodilators Prednisone with slow taper over 7 to 10 days Mobilize Reflux precautions Continue Nexium twice daily and Pepcid at at bedtime Continue  metoclopramide before meals She will need outpatient follow-up with GI She has follow-up with [place] on 6/18  I think she could go home at any point when medical service deems appropriate    Chronic diastolic heart failure, hypertension: Seems compensated at this point Plan Cont current rx Add extra dose of demadex today (increased pedal edema)  CKD Plan Per IM   Diabetes with hyperglycemia Plan ssi    We will sign off   Erick Colace ACNP-BC [facility] [facility] Pager # [phone] OR # [phone] if no answer

## 2017-10-11 NOTE — Progress Notes (Signed)
PROGRESS NOTE    [name]  AQT:[id number] DOB: 08/25/1950 DOA: 10/05/2017 PCP: [name], MD    Brief Narrative:  [name] is a 67 y.o. female with medical history significant of asthma, type 2 diabetes, hypertension, hyperlipidemia, pulmonary emboli, depression, coronary artery disease, stent placement following a heart attack in September 2017, seizure disorder with occasional episodes of confusion, chronic diastolic CHF who presented with URI with cough, dyspnea, chest tightness. She has been using her nebulizer treatment and prednisone without improvement. She has sick contact.  Admitted for acute asthma exacerbation, slow to resolve and hence PCCM consulted.  [facility] consulted for LPR.  Assessment & Plan:   Principal Problem:   Asthma exacerbation Active Problems:   Depression with anxiety   Essential hypertension   G E R D   PULMONARY EMBOLISM, HX OF   Diabetes mellitus type 2, controlled ([place])   Acute bronchitis with asthma with acute exacerbation   Chronic diastolic CHF (congestive heart failure) (HCC)   Macrocytic anemia  Acute asthma exacerbation RSV panel & d-dimer negative. Treated with oxygen, IV Solu-Medrol, duo nebs, Pulmicort.  Received IV magnesium Despite treatment, continued to have significant wheezing.  CCM consulted and follow-up appreciated.  They suspect that acid reflux is playing a big component now with her persistent symptoms.   Now on oral prednisone.  Complete course of azithromycin. Upper airway wheezing seems to be better today.  Acute Respiratory failure with hypoxia;  Related to asthma exacerbation.  Nebulizer, oxygen.  Titrate oxygen as needed.  Anemia;  Anemia panel; iron 72, ferritin 135 B-12: 589. Might need iron trial.  Hb stable.   CAD;  Continue with aspirin, lipitor, Bystolic.  Asymptomatic of ischemic symptoms.  Chest pain;  Enzymes; negative.  D dimer negative.  Related to asthma exacerbation.   Resolved.  Acute on chronic Diastolic HF;  Received IV Lasix earlier on.  Remains on torsemide.  +1.2 L since admission, not sure if accurate.  Weight also up by 9 pounds but not clinically very volume overloaded.  History of seizure;  Continue with Depakote.   DM; type 2.  Uncontrolled despite Lantus and SSI.  Likely related to steroids.  Increase Lantus from 15 to 20 units daily.  AKI;  Resolved.  GERD -  [facility] input appreciated and recommending considering GES or 24-hour pH probe as outpatient, consider erythromycin or Reglan.  Continue Pepcid, Maalox and Nexium. - MBS report appreciated.  Speech therapy consulted for dietary recommendations. -Scheduled Reglan started 6/13.  As discussed with pulmonology, may need to continue for a few days to determine response.   DVT prophylaxis: Lovenox Code Status: full code. Per nurse report, patient wants to be full code.  Family Communication: None at bedside today. Disposition Plan: home when stable.   Consultants:   CCM  [facility]   Procedures:   Doppler LE.   Antimicrobials:  Azithromycin   Subjective: Patient states that overall she feels 40% better.  Wheezing seems to be slightly less compared to yesterday.  Had cough overnight but unable to bring up any sputum.  Objective: Vitals:   10/11/17 0000 10/11/17 0441 10/11/17 1603 10/11/17 1624  BP: 124/65 (!) 156/74  (!) 132/57  Pulse: 80 73  90  Resp:  19  18  Temp:  (!) 97.3 F (36.3 C)    TempSrc:  Oral    SpO2: 100% 100% 98% 98%  Weight:  79.5 kg (175 lb 3.2 oz)    Height:  Intake/Output Summary (Last 24 hours) at 10/11/2017 1711 Last data filed at 10/11/2017 3818 Gross per 24 hour  Intake 720 ml  Output 450 ml  Net 270 ml   Filed Weights   10/09/17 0408 10/10/17 0334 10/11/17 0441  Weight: 78.2 kg (172 lb 6.4 oz) 79 kg (174 lb 3.2 oz) 79.5 kg (175 lb 3.2 oz)    Examination:  General exam: Pleasant middle-aged female, moderately built and  overweight sitting up in chair this morning with less audible wheezing. Respiratory system: Predominantly upper airway wheezing but no stridor.  No increased work of breathing.  Wheezing seems less today than yesterday. Cardiovascular system: S1 and S2 heard, RRR.  No JVD, murmurs or pedal edema.  Telemetry personally reviewed: Sinus rhythm. Gastrointestinal system: Abdomen is nondistended, soft and nontender.  Normal bowel sounds heard.  Stable Central nervous system: Alert and oriented.  No focal neurological deficits. Extremities: symmetric power.  Skin: no rash      Data Reviewed: I have personally reviewed following labs and imaging studies  CBC: Recent Labs  Lab 10/05/17 1625 10/06/17 0915 10/07/17 0259 10/08/17 0333 10/09/17 0731  WBC 7.2 9.6 17.6* 18.0* 15.4*  NEUTROABS 4.5  --   --   --   --   HGB 10.2* 10.2* 9.5* 9.7* 9.9*  HCT 32.7* 32.0* 29.3* 30.6* 31.1*  MCV 100.0 97.9 97.3 98.1 97.8  PLT 265 297 274 287 299   Basic Metabolic Panel: Recent Labs  Lab 10/06/17 0915 10/07/17 0259 10/08/17 0333 10/09/17 0731 10/10/17 0732  NA 135 136 136 134* 137  K 3.5 4.3 4.3 4.0 4.3  CL 102 101 100* 97* 99*  CO2 18* 24 26 27 30   GLUCOSE 365* 248* 251* 292* 227*  BUN 23* 35* 39* 46* 35*  CREATININE 1.30* 1.39* 1.37* 1.60* 1.17*  CALCIUM 8.6* 8.1* 7.7* 7.3* 7.4*  MG 1.7  --   --   --   --   PHOS 2.9  --   --   --   --    GFR: Estimated Creatinine Clearance: 45 mL/min (A) (by C-G formula based on SCr of 1.17 mg/dL (H)). Liver Function Tests: Recent Labs  Lab 10/06/17 0915  AST 22  ALT 17  ALKPHOS 48  BILITOT 0.5  PROT 7.2  ALBUMIN 3.4*   Cardiac Enzymes: Recent Labs  Lab 10/06/17 0253 10/06/17 0915 10/06/17 1414  TROPONINI <0.03 <0.03 <0.03   CBG: Recent Labs  Lab 10/10/17 1113 10/10/17 1617 10/10/17 2058 10/11/17 0554 10/11/17 1150  GLUCAP 236* 184* 294* 146* 261*     Recent Results (from the past 240 hour(s))  Respiratory Panel by PCR      Status: None   Collection Time: 10/07/17  1:18 PM  Result Value Ref Range Status   Adenovirus NOT DETECTED NOT DETECTED Final   Coronavirus 229E NOT DETECTED NOT DETECTED Final   Coronavirus HKU1 NOT DETECTED NOT DETECTED Final   Coronavirus NL63 NOT DETECTED NOT DETECTED Final   Coronavirus OC43 NOT DETECTED NOT DETECTED Final   Metapneumovirus NOT DETECTED NOT DETECTED Final   Rhinovirus / Enterovirus NOT DETECTED NOT DETECTED Final   Influenza A NOT DETECTED NOT DETECTED Final   Influenza B NOT DETECTED NOT DETECTED Final   Parainfluenza Virus 1 NOT DETECTED NOT DETECTED Final   Parainfluenza Virus 2 NOT DETECTED NOT DETECTED Final   Parainfluenza Virus 3 NOT DETECTED NOT DETECTED Final   Parainfluenza Virus 4 NOT DETECTED NOT DETECTED Final   Respiratory Syncytial Virus  NOT DETECTED NOT DETECTED Final   Bordetella pertussis NOT DETECTED NOT DETECTED Final   Chlamydophila pneumoniae NOT DETECTED NOT DETECTED Final   Mycoplasma pneumoniae NOT DETECTED NOT DETECTED Final    Comment: Performed at [facility], [place] [address], [place], [place] [address]  Culture, expectorated sputum-assessment     Status: None   Collection Time: 10/08/17 12:15 PM  Result Value Ref Range Status   Specimen Description SPUTUM  Final   Special Requests NONE  Final   Sputum evaluation   Final    THIS SPECIMEN IS ACCEPTABLE FOR SPUTUM CULTURE Performed at [facility], [place] [address], [place], [place] [address]    Report Status 10/08/2017 FINAL  Final  Culture, respiratory (NON-Expectorated)     Status: None   Collection Time: 10/08/17 12:15 PM  Result Value Ref Range Status   Specimen Description SPUTUM  Final   Special Requests NONE Reflexed from [id number]  Final   Gram Stain   Final    RARE WBC PRESENT, PREDOMINANTLY PMN RARE SQUAMOUS EPITHELIAL CELLS PRESENT MODERATE GRAM POSITIVE COCCI IN PAIRS IN CHAINS IN CLUSTERS RARE GRAM VARIABLE ROD    Culture   Final    MODERATE Consistent  with normal respiratory flora. Performed at [facility], [place] [address], [place], [place] [address]    Report Status 10/10/2017 FINAL  Final         Radiology Studies: No results found.      Scheduled Meds: . aspirin EC  81 mg Oral Daily  . atorvastatin  40 mg Oral Daily  . budesonide (PULMICORT) nebulizer solution  0.5 mg Nebulization BID  . diltiazem  240 mg Oral Daily  . divalproex  1,000 mg Oral QHS  . enoxaparin (LOVENOX) injection  40 mg Subcutaneous Q24H  . esomeprazole  40 mg Oral BID AC  . famotidine  10 mg Oral Daily  . feeding supplement (GLUCERNA SHAKE)  237 mL Oral BID BM  . guaiFENesin  600 mg Oral BID  . hydrALAZINE  25 mg Oral BID  . insulin aspart  0-20 Units Subcutaneous TID WC  . insulin aspart  0-5 Units Subcutaneous QHS  . insulin glargine  20 Units Subcutaneous Daily  . ipratropium-albuterol  3 mL Nebulization Q4H WA  . loratadine  10 mg Oral Daily  . metoCLOPramide  5 mg Oral TID AC  . nebivolol  5 mg Oral Daily  . predniSONE  40 mg Oral Q breakfast  . torsemide  20 mg Oral Daily   Continuous Infusions:    LOS: 5 days    Time spent: 35 minutes.    Vernell Leep, MD, FACP, [facility]. [facility] Pager [phone]  If 7PM-7AM, please contact night-coverage [email or web address] Password [email or web address] 10/11/2017, 5:11 PM

## 2017-10-11 NOTE — Evaluation (Signed)
Clinical/Bedside Swallow Evaluation Patient Details  Name: [name] MRN: [id number] Date of Birth: 1951-04-19  Today's Date: 10/11/2017 Time: SLP Start Time (ACUTE ONLY): 3 SLP Stop Time (ACUTE ONLY): 1010 SLP Time Calculation (min) (ACUTE ONLY): 50 min  Past Medical History:  Past Medical History:  Diagnosis Date  . Anxiety   . Asthma   . Bronchitis   . Chronic diastolic CHF (congestive heart failure) ([place])   . Chronic diastolic CHF (congestive heart failure) ([place]) 07/17/2016  . Coronary artery disease    a. NSTEMI 12/2015 - LHC 01/18/16: s/p overlapping DESx2 to RCA, 10% ost-prox Cx,10% mLAD.  Marland Kitchen Depression   . Diabetes mellitus   . Dyslipidemia   . GERD (gastroesophageal reflux disease)   . Heart attack ([place])   . Hypertension   . Hypertensive heart disease   . NSTEMI (non-ST elevated myocardial infarction) ([place]) 12/2015  . Pneumonia    Past Surgical History:  Past Surgical History:  Procedure Laterality Date  . ABDOMINAL HYSTERECTOMY    . CARDIAC CATHETERIZATION  1994   minimal LAD dz, no other dz, EF normal  . CARDIAC CATHETERIZATION N/A 01/18/2016   Procedure: Left Heart Cath and Coronary Angiography;  Surgeon: Burnell Blanks, MD;  Location: [facility];  Service: Cardiovascular;  Laterality: N/A;  . CARDIAC CATHETERIZATION N/A 01/18/2016   Procedure: Coronary Stent Intervention;  Surgeon: Burnell Blanks, MD;  Location: [facility];  Service: Cardiovascular;  Laterality: N/A;  . COLONOSCOPY WITH PROPOFOL N/A 04/09/2014   Procedure: COLONOSCOPY WITH PROPOFOL;  Surgeon: Cleotis Nipper, MD;  Location: [facility];  Service: Endoscopy;  Laterality: N/A;  . CORONARY STENT PLACEMENT  01/19/2016   STENT SYNERGY DES [id number] drug eluting stent was successfully placed, and overlaps the 2.5 x 38 mm Synergy stent placed distally.  . ESOPHAGOGASTRODUODENOSCOPY (EGD) WITH PROPOFOL N/A 04/09/2014   Procedure: ESOPHAGOGASTRODUODENOSCOPY (EGD) WITH  PROPOFOL;  Surgeon: Cleotis Nipper, MD;  Location: [facility];  Service: Endoscopy;  Laterality: N/A;  . HEMORRHOID SURGERY    . HERNIA REPAIR    . KNEE ARTHROSCOPY     HPI:  67 year old female admitted 10/05/17 with asthma exacerbation. PMH: DM2, HTN, HLD, PE, depression, CAD, MI (2017), seizures, CHF, GERD. BaSw 10/09/17 = normal esophageal motility, penetration but no aspiration.   Assessment / Plan / Recommendation Clinical Impression  Pt presents with adequate dentition and oral motor strength and function. She reports history of significant GERD, but tolerance of regular solids and thin liquids prior to admit. Pt exhibits no overt s/s aspiration given trials of thin liquid, puree, and solid consistencies. Barium Swallow completed 10/09/17 indicated penetration (considered WFL for her age). No aspiration was seen on the esophageal study.  Recommend continuing with regular diet and thin liquids, with adherence to behavioral and dietary strategies for managing esophageal dysmotility and reflux. This information was provided in written form to pt, and reviewed with her. SLP will follow up for continued education and assessment of diet tolerance.     SLP Visit Diagnosis: Dysphagia, pharyngoesophageal phase (R13.14)    Aspiration Risk  Mild aspiration risk    Diet Recommendation Thin liquid;Regular   Liquid Administration via: Cup;Straw Medication Administration: Whole meds with liquid Supervision: Patient able to self feed Compensations: Minimize environmental distractions;Slow rate;Small sips/bites Postural Changes: Seated upright at 90 degrees;Remain upright for at least 30 minutes after po intake    Other  Recommendations Oral Care Recommendations: Oral care BID   Follow up  Recommendations None      Frequency and Duration min 1 x/week  1 week       Prognosis Prognosis for Safe Diet Advancement: Good      Swallow Study   General Date of Onset: 10/05/17 HPI: 67 year old  female admitted 10/05/17 with asthma exacerbation. PMH: DM2, HTN, HLD, PE, depression, CAD, MI (2017), seizures, CHF, GERD. BaSw 10/09/17 = normal esophageal motility, penetration but no aspiration. Type of Study: Bedside Swallow Evaluation Previous Swallow Assessment: no ST intervention Diet Prior to this Study: Regular;Thin liquids Temperature Spikes Noted: No Respiratory Status: Nasal cannula History of Recent Intubation: No Behavior/Cognition: Alert;Cooperative;Pleasant mood Oral Cavity Assessment: Within Functional Limits Oral Care Completed by SLP: No Oral Cavity - Dentition: Adequate natural dentition Vision: Functional for self-feeding Self-Feeding Abilities: Able to feed self Patient Positioning: Upright in chair Baseline Vocal Quality: Normal Volitional Cough: Strong Volitional Swallow: Able to elicit    Oral/Motor/Sensory Function Overall Oral Motor/Sensory Function: Within functional limits   Ice Chips Ice chips: Not tested   Thin Liquid Thin Liquid: Within functional limits Presentation: Straw    Nectar Thick Nectar Thick Liquid: Not tested   Honey Thick Honey Thick Liquid: Not tested   Puree Puree: Within functional limits Presentation: Spoon;Self Fed   Solid   GO  Celia B. Quentin [facility], CCC-SLP Speech Language Pathologist [phone] Solid: Within functional limits Presentation: [place], Fredirick M[place]lin 10/11/2017,10:10 AM

## 2017-10-12 LAB — GLUCOSE, CAPILLARY
GLUCOSE-CAPILLARY: 136 mg/dL — AB (ref 65–99)
Glucose-Capillary: 138 mg/dL — ABNORMAL HIGH (ref 65–99)
Glucose-Capillary: 298 mg/dL — ABNORMAL HIGH (ref 65–99)
Glucose-Capillary: 312 mg/dL — ABNORMAL HIGH (ref 65–99)

## 2017-10-12 MED ORDER — METOCLOPRAMIDE HCL 5 MG PO TABS
5.0000 mg | ORAL_TABLET | Freq: Three times a day (TID) | ORAL | Status: DC
  Administered 2017-10-12 – 2017-10-18 (×24): 5 mg via ORAL
  Filled 2017-10-12 (×24): qty 1

## 2017-10-12 MED ORDER — PREDNISONE 20 MG PO TABS
30.0000 mg | ORAL_TABLET | Freq: Every day | ORAL | Status: DC
  Administered 2017-10-12: 30 mg via ORAL
  Filled 2017-10-12: qty 1

## 2017-10-12 MED ORDER — FLUTICASONE PROPIONATE 50 MCG/ACT NA SUSP
2.0000 | Freq: Every day | NASAL | Status: DC
  Administered 2017-10-12 – 2017-10-18 (×6): 2 via NASAL
  Filled 2017-10-12: qty 16

## 2017-10-12 NOTE — Care Management Note (Signed)
Case Management Note Marvetta Gibbons RN, BSN Unit 4E-Case Manager [phone]  Patient Details  Name: [name] MRN: [id number] Date of Birth: Oct 30, 1950  Subjective/Objective:  Pt admitted with asthma exacerbation                  Action/Plan: PTA pt; lived at home with spouse, independent- anticipate return home. CM to follow for transition of care needs  Expected Discharge Date:                  Expected Discharge Plan:  Home/Self Care  In-House Referral:  NA  Discharge planning Services  CM Consult  Post Acute Care Choice:    Choice offered to:     DME Arranged:    DME Agency:     HH Arranged:    HH Agency:     Status of Service:  In process, will continue to follow  If discussed at Long Length of Stay Meetings, dates discussed:     Discharge Disposition:   Additional Comments:  Dawayne Patricia, RN 10/12/2017, 4:14 PM

## 2017-10-12 NOTE — Progress Notes (Signed)
  Speech Language Pathology Treatment: Dysphagia  Patient Details Name: [name] MRN: [id number] DOB: January 27, 1951 Today's Date: 10/12/2017 Time: 1517-6160 SLP Time Calculation (min) (ACUTE ONLY): 12 min  Assessment / Plan / Recommendation Clinical Impression  ST follow up for therapeutic diet tolerance and patient education.  Chart review indicated that the patient has been afebrile, intake has been good and lungs with expiratory wheezes.  Nursing and patient reported coughing overnight while she was sleeping but no obvious problems with intake.  The patient independently recalled reflux precautions.  Meal observation was completed and the patient appeared to do well.  Will follow briefly during acute stay.   HPI HPI: 68 year old female admitted 10/05/17 with asthma exacerbation. PMH: DM2, HTN, HLD, PE, depression, CAD, MI (2017), seizures, CHF, GERD. BaSw 10/09/17 = normal esophageal motility, penetration but no aspiration.      SLP Plan  Continue with current plan of care       Recommendations  Diet recommendations: Regular;Thin liquid Liquids provided via: Cup;Straw Medication Administration: Whole meds with liquid Supervision: Patient able to self feed Compensations: Slow rate;Small sips/bites;Follow solids with liquid Postural Changes and/or Swallow Maneuvers: Seated upright 90 degrees;Upright 30-60 min after meal                Oral Care Recommendations: Oral care BID Follow up Recommendations: None SLP Visit Diagnosis: Dysphagia, pharyngoesophageal phase (R13.14) Plan: Continue with current plan of care       [place], MA, [facility] SLP [phone] Lamar Sprinkles 10/12/2017, 10:20 AM

## 2017-10-12 NOTE — Progress Notes (Signed)
PROGRESS NOTE    [name]  NLG:[id number] DOB: 08-May-1950 DOA: 10/05/2017 PCP: [name], MD    Brief Narrative:  [name] is a 67 y.o. female with medical history significant of asthma, type 2 diabetes, hypertension, hyperlipidemia, pulmonary emboli, depression, coronary artery disease, stent placement following a heart attack in September 2017, seizure disorder with occasional episodes of confusion, chronic diastolic CHF who presented with URI with cough, dyspnea, chest tightness. She has been using her nebulizer treatment and prednisone without improvement. She has sick contact.  Admitted for acute asthma exacerbation, slow to resolve and hence PCCM consulted.  [facility] consulted for LPR.  Assessment & Plan:   Principal Problem:   Asthma exacerbation Active Problems:   Depression with anxiety   Essential hypertension   G E R D   PULMONARY EMBOLISM, HX OF   Diabetes mellitus type 2, controlled ([place])   Acute bronchitis with asthma with acute exacerbation   Chronic diastolic CHF (congestive heart failure) (HCC)   Macrocytic anemia  Acute asthma exacerbation RSV panel & d-dimer negative. Treated with oxygen, IV Solu-Medrol, duo nebs, Pulmicort.  Received IV magnesium Despite treatment, continued to have significant wheezing.  CCM consulted and follow-up appreciated.  They suspect that acid reflux is playing a big component now with her persistent symptoms.   Now on oral prednisone.  Complete course of azithromycin.  Tapering prednisone. Ongoing issues with upper airway wheezing, please see below.  Acute Respiratory failure with hypoxia;  Related to asthma exacerbation.  Not hypoxic on room air at rest.  Will need reassessment for home oxygen needs prior to discharge.  Anemia;  Anemia panel; iron 72, ferritin 135 B-12: 589. Might need iron trial.  Hb stable.   CAD;  Continue with aspirin, lipitor, Bystolic.  Asymptomatic of ischemic symptoms.  Chest pain;    Enzymes; negative.  D dimer negative.  Related to asthma exacerbation.  Resolved.  Acute on chronic Diastolic HF;  Received IV Lasix earlier on.  Remains on torsemide.  Clinically does not appear volume overloaded.  History of seizure;  Continue with Depakote.   DM; type 2.  Uncontrolled despite Lantus and SSI.  Likely related to steroids.  Increase Lantus from 15 to 20 units daily.  CBGs better today.  With reduction in steroid dose, may need to decrease Lantus, monitor closely.  AKI;  Resolved.  GERD -  [facility] input appreciated and recommending considering GES or 24-hour pH probe as outpatient, consider erythromycin or Reglan.  Continue Pepcid, Maalox and Nexium. - MBS report appreciated.  Speech therapy consulted for dietary recommendations and recommend regular diet and thin liquids. -Scheduled Reglan started 6/13.  As discussed with pulmonology, may need to continue for a few days to determine response. -Ongoing issues with upper airway wheezing.  Increased Reglan to include bedtime dose.  Minimized prednisone.  Pulmonology has added Flonase for possible postnasal drip which may be contributing.   DVT prophylaxis: Lovenox Code Status: full code. Per nurse report, patient wants to be full code.  Family Communication: None at bedside today. Disposition Plan: Home pending further clinical improvement, hopefully within the next day or 2.  Consultants:   CCM  [facility]   Procedures:   Doppler LE.   Antimicrobials:  Azithromycin   Subjective: Patient states that she feels worse today than yesterday.  Did well in the daytime yesterday but woke up at around 2 AM last night just like she had the night before with "choking sensation".  Reports significant heartburn if lies flat and has been lying propped up.  Went over precautions for GERD management.  Objective: Vitals:   10/12/17 0800 10/12/17 1145 10/12/17 1259 10/12/17 1300  BP: (!) 159/76 (!) 147/62 (!) 146/75 (!)  146/75  Pulse: 86 86 88   Resp:  18    Temp: 98 F (36.7 C)  97.9 F (36.6 C)   TempSrc: Oral  Oral   SpO2: 98% 96% 100% 100%  Weight:      Height:        Intake/Output Summary (Last 24 hours) at 10/12/2017 1556 Last data filed at 10/12/2017 0900 Gross per 24 hour  Intake 440 ml  Output 2350 ml  Net -1910 ml   Filed Weights   10/10/17 0334 10/11/17 0441 10/12/17 0437  Weight: 79 kg (174 lb 3.2 oz) 79.5 kg (175 lb 3.2 oz) 79.2 kg (174 lb 8 oz)    Examination:  General exam: Pleasant middle-aged female, moderately built and overweight sitting up in chair this morning with some audible wheezing. Respiratory system: Not much change in upper airway wheezing compared to yesterday.  No crackles.  No stridor.  No increased work of breathing. Cardiovascular system: S1 and S2 heard, RRR.  No JVD, murmurs or pedal edema.  Telemetry personally reviewed: Sinus rhythm.  Stable Gastrointestinal system: Abdomen is nondistended, soft and nontender.  Normal bowel sounds heard.  Stable Central nervous system: Alert and oriented.  No focal neurological deficits. Extremities: symmetric power.  Skin: no rash      Data Reviewed: I have personally reviewed following labs and imaging studies  CBC: Recent Labs  Lab 10/05/17 1625 10/06/17 0915 10/07/17 0259 10/08/17 0333 10/09/17 0731  WBC 7.2 9.6 17.6* 18.0* 15.4*  NEUTROABS 4.5  --   --   --   --   HGB 10.2* 10.2* 9.5* 9.7* 9.9*  HCT 32.7* 32.0* 29.3* 30.6* 31.1*  MCV 100.0 97.9 97.3 98.1 97.8  PLT 265 297 274 287 476   Basic Metabolic Panel: Recent Labs  Lab 10/06/17 0915 10/07/17 0259 10/08/17 0333 10/09/17 0731 10/10/17 0732  NA 135 136 136 134* 137  K 3.5 4.3 4.3 4.0 4.3  CL 102 101 100* 97* 99*  CO2 18* 24 26 27 30   GLUCOSE 365* 248* 251* 292* 227*  BUN 23* 35* 39* 46* 35*  CREATININE 1.30* 1.39* 1.37* 1.60* 1.17*  CALCIUM 8.6* 8.1* 7.7* 7.3* 7.4*  MG 1.7  --   --   --   --   PHOS 2.9  --   --   --   --     GFR: Estimated Creatinine Clearance: 44.8 mL/min (A) (by C-G formula based on SCr of 1.17 mg/dL (H)). Liver Function Tests: Recent Labs  Lab 10/06/17 0915  AST 22  ALT 17  ALKPHOS 48  BILITOT 0.5  PROT 7.2  ALBUMIN 3.4*   Cardiac Enzymes: Recent Labs  Lab 10/06/17 0253 10/06/17 0915 10/06/17 1414  TROPONINI <0.03 <0.03 <0.03   CBG: Recent Labs  Lab 10/11/17 1150 10/11/17 1706 10/11/17 2106 10/12/17 0601 10/12/17 1143  GLUCAP 261* 329* 219* 136* 138*     Recent Results (from the past 240 hour(s))  Respiratory Panel by PCR     Status: None   Collection Time: 10/07/17  1:18 PM  Result Value Ref Range Status   Adenovirus NOT DETECTED NOT DETECTED Final   Coronavirus 229E NOT DETECTED NOT DETECTED Final   Coronavirus HKU1 NOT DETECTED NOT DETECTED Final  Coronavirus NL63 NOT DETECTED NOT DETECTED Final   Coronavirus OC43 NOT DETECTED NOT DETECTED Final   Metapneumovirus NOT DETECTED NOT DETECTED Final   Rhinovirus / Enterovirus NOT DETECTED NOT DETECTED Final   Influenza A NOT DETECTED NOT DETECTED Final   Influenza B NOT DETECTED NOT DETECTED Final   Parainfluenza Virus 1 NOT DETECTED NOT DETECTED Final   Parainfluenza Virus 2 NOT DETECTED NOT DETECTED Final   Parainfluenza Virus 3 NOT DETECTED NOT DETECTED Final   Parainfluenza Virus 4 NOT DETECTED NOT DETECTED Final   Respiratory Syncytial Virus NOT DETECTED NOT DETECTED Final   Bordetella pertussis NOT DETECTED NOT DETECTED Final   Chlamydophila pneumoniae NOT DETECTED NOT DETECTED Final   Mycoplasma pneumoniae NOT DETECTED NOT DETECTED Final    Comment: Performed at [facility], [place] [address], [place], [place] [address]  Culture, expectorated sputum-assessment     Status: None   Collection Time: 10/08/17 12:15 PM  Result Value Ref Range Status   Specimen Description SPUTUM  Final   Special Requests NONE  Final   Sputum evaluation   Final    THIS SPECIMEN IS ACCEPTABLE FOR SPUTUM  CULTURE Performed at [facility], [place] [address] [place], [place] [address]    Report Status 10/08/2017 FINAL  Final  Culture, respiratory (NON-Expectorated)     Status: None   Collection Time: 10/08/17 12:15 PM  Result Value Ref Range Status   Specimen Description SPUTUM  Final   Special Requests NONE Reflexed from [id number]  Final   Gram Stain   Final    RARE WBC PRESENT, PREDOMINANTLY PMN RARE SQUAMOUS EPITHELIAL CELLS PRESENT MODERATE GRAM POSITIVE COCCI IN PAIRS IN CHAINS IN CLUSTERS RARE GRAM VARIABLE ROD    Culture   Final    MODERATE Consistent with normal respiratory flora. Performed at [facility], [place] [address] [place], [place] [address]    Report Status 10/10/2017 FINAL  Final         Radiology Studies: No results found.      Scheduled Meds: . aspirin EC  81 mg Oral Daily  . atorvastatin  40 mg Oral Daily  . budesonide (PULMICORT) nebulizer solution  0.5 mg Nebulization BID  . diltiazem  240 mg Oral Daily  . divalproex  1,000 mg Oral QHS  . enoxaparin (LOVENOX) injection  40 mg Subcutaneous Q24H  . esomeprazole  40 mg Oral BID AC  . famotidine  10 mg Oral Daily  . feeding supplement (GLUCERNA SHAKE)  237 mL Oral BID BM  . fluticasone  2 spray Each Nare Daily  . guaiFENesin  600 mg Oral BID  . hydrALAZINE  25 mg Oral BID  . insulin aspart  0-20 Units Subcutaneous TID WC  . insulin aspart  0-5 Units Subcutaneous QHS  . insulin glargine  20 Units Subcutaneous Daily  . ipratropium-albuterol  3 mL Nebulization TID  . loratadine  10 mg Oral Daily  . metoCLOPramide  5 mg Oral TID AC & HS  . nebivolol  5 mg Oral Daily  . predniSONE  30 mg Oral Q breakfast  . torsemide  20 mg Oral Daily   Continuous Infusions:    LOS: 6 days    Time spent: 35 minutes.    Vernell Leep, MD, FACP, [facility]. [facility] Pager [phone]  If 7PM-7AM, please contact night-coverage [email or web address] Password [facility] 10/12/2017, 3:56 PM

## 2017-10-12 NOTE — Care Management Important Message (Signed)
Important Message  Patient Details  Name: [name] MRN: [id number] Date of Birth: 04-25-1951   Medicare Important Message Given:  Yes    Barb Merino [place] 10/12/2017, 3:44 PM

## 2017-10-12 NOTE — Progress Notes (Signed)
Physical Therapy Discharge Patient Details Name: [name] MRN: [id number] DOB: 28-Apr-1951 Today's Date: 10/12/2017 Time:  -     Patient discharged from PT services secondary to goals met and no further PT needs identified.  Pt and nursing report pt now amb independently in hallways.  Progress and discharge plan discussed with patient and/or caregiver: Patient/Caregiver agrees with plan  GP     Shary Decamp [facility] 10/12/2017, 8:54 AM  Suanne Marker PT [phone]

## 2017-10-12 NOTE — Progress Notes (Addendum)
Pulmonary medicine follow-up  Brief. 67 year old remote smoker quit in 2005.  Admitted 6/7 with asthmatic exacerbation.  Had sick contact with grandchild prior.  Baseline PFTs with normal spirometry May 2019.  Subjective Feels about the same.  Objective BP (!) 159/76 (BP Location: Left Arm)   Pulse 86   Temp 98 F (36.7 C) (Oral)   Resp 18   Ht 5\' 2"  (1.575 m)   Wt 174 lb 8 oz (79.2 kg)   LMP  (LMP Unknown)   SpO2 98%   BMI 31.92 kg/m   Intake/Output Summary (Last 24 hours) at 10/12/2017 1152 Last data filed at 10/12/2017 0900 Gross per 24 hour  Intake 920 ml  Output 3350 ml  Net -2430 ml    Physical exam Gen:      No acute distress HEENT:  EOMI, sclera anicteric Neck:     No masses; no thyromegaly Lungs:    Bilateral wheeze, predominantly in the upper airway CV:         Regular rate and rhythm; no murmurs Abd:      + bowel sounds; soft, non-tender; no palpable masses, no distension Ext:    No edema; adequate peripheral perfusion Skin:      Warm and dry; no rash Neuro: alert and oriented x 3 Psych: normal mood and affect  CBC No results for input(s): WBC, HGB, HCT, PLT in the last 72 hours.  Coag's No results for input(s): APTT, INR in the last 72 hours.  BMET Recent Labs    10/10/17 0732  NA 137  K 4.3  CL 99*  CO2 30  BUN 35*  CREATININE 1.17*  GLUCOSE 227*    Electrolytes Recent Labs    10/10/17 0732  CALCIUM 7.4*    Sepsis Markers No results for input(s): PROCALCITON, O2SATVEN in the last 72 hours.  Invalid input(s): LACTICACIDVEN  ABG No results for input(s): PHART, PCO2ART, PO2ART in the last 72 hours.  Liver Enzymes No results for input(s): AST, ALT, ALKPHOS, BILITOT, ALBUMIN in the last 72 hours.  Cardiac Enzymes No results for input(s): TROPONINI, PROBNP in the last 72 hours.  Glucose Recent Labs    10/10/17 2058 10/11/17 0554 10/11/17 1150 10/11/17 1706 10/11/17 2106 10/12/17 0601  GLUCAP 294* 146* 261* 329* 219* 136*     Imaging No results found.  Impression/plan Asthmatic exacerbation with purulent bronchitis; seemingly further complicated by significant component of laryngal pharyngeal reflux disease -Respiratory viral panel was negative -Sputum w/ NPF -completed abx -still w/ reflux symptoms.  I believe this is a large contributing factor to her symptoms.  Appreciate GI involvement  Plan/recommendation Continue bronchodilators Prednisone with slow taper On Nexium, Pepcid, Reglan for acid reflux. Can add flonase to  help with postnasal drip which may be contributing to ongoing symptoms. Continue loratidine  Chronic diastolic heart failure, hypertension: Seems compensated at this point Plan Continue current therapy  CKD Plan Per IM   Diabetes with hyperglycemia Plan SSI coverage  Marshell Garfinkel MD [facility] 10/12/2017, 11:55 AM

## 2017-10-13 LAB — GLUCOSE, CAPILLARY
GLUCOSE-CAPILLARY: 198 mg/dL — AB (ref 65–99)
Glucose-Capillary: 154 mg/dL — ABNORMAL HIGH (ref 65–99)
Glucose-Capillary: 197 mg/dL — ABNORMAL HIGH (ref 65–99)
Glucose-Capillary: 164 mg/dL — ABNORMAL HIGH (ref 65–99)

## 2017-10-13 MED ORDER — IPRATROPIUM-ALBUTEROL 0.5-2.5 (3) MG/3ML IN SOLN
3.0000 mL | Freq: Two times a day (BID) | RESPIRATORY_TRACT | Status: DC
  Administered 2017-10-14: 3 mL via RESPIRATORY_TRACT
  Filled 2017-10-13 (×3): qty 3

## 2017-10-13 NOTE — Plan of Care (Signed)
  Problem: Clinical Measurements: Goal: Respiratory complications will improve Outcome: Progressing   

## 2017-10-13 NOTE — Progress Notes (Signed)
RT NOTE: Peak Flow 150

## 2017-10-13 NOTE — Progress Notes (Signed)
PROGRESS NOTE    [name]  NWG:[id number] DOB: 06/09/1950 DOA: 10/05/2017 PCP: [name], MD    Brief Summary:  [name] is a 67 y.o. female with medical history significant of asthma, type 2 diabetes, hypertension, hyperlipidemia, pulmonary emboli, depression, coronary artery disease, stent placement following a heart attack in September 2017, seizure disorder with occasional episodes of confusion, chronic diastolic CHF who presented with URI with cough, dyspnea, chest tightness. She has been using her nebulizer treatment and prednisone without improvement. She has sick contact.  Admitted for acute asthma exacerbation, slow to resolve and hence PCCM consulted.  [facility] consulted for LPR.  Assessment & Plan:   Principal Problem:   Asthma exacerbation Active Problems:   Depression with anxiety   Essential hypertension   G E R D   PULMONARY EMBOLISM, HX OF   Diabetes mellitus type 2, controlled ([place])   Acute bronchitis with asthma with acute exacerbation   Chronic diastolic CHF (congestive heart failure) (HCC)   Macrocytic anemia  Acute asthma exacerbation RSV panel & d-dimer negative. Treated with oxygen, IV Solu-Medrol, duo nebs, Pulmicort.  Received IV magnesium Despite treatment, continued to have significant wheezing.  CCM consulted and follow-up appreciated.  They suspect that acid reflux is playing a big component now with her persistent symptoms.   Now on oral prednisone.  Complete course of azithromycin.  Tapering prednisone. Ongoing issues with upper airway wheezing, please see below. -Peak flow today (10/13/2017) is said to be less than 100.  Acute Respiratory failure with hypoxia;  Related to asthma exacerbation.  Not hypoxic on room air at rest.   Will need reassessment for home oxygen needs prior to discharge.  Anemia;  Anemia panel; iron 72, ferritin 135 B-12: 589. Might need iron trial.  Hb stable.   CAD;  Continue with aspirin, lipitor,  Bystolic.  Asymptomatic of ischemic symptoms.  Chest pain;  Enzymes; negative.  D dimer negative.  Related to asthma exacerbation.  Resolved.  Acute on chronic Diastolic HF;  Received IV Lasix earlier on.  Remains on torsemide.  Clinically does not appear volume overloaded.  History of seizure;  Continue with Depakote.   DM; type 2.  Uncontrolled despite Lantus and SSI.  Likely related to steroids.  Increase Lantus from 15 to 20 units daily.  CBGs better today.  With reduction in steroid dose, may need to decrease Lantus, monitor closely.  AKI;  Resolved.  GERD -  [facility] input appreciated and recommending considering GES or 24-hour pH probe as outpatient, consider erythromycin or Reglan.  Continue Pepcid, Maalox and Nexium. - MBS report appreciated.  Speech therapy consulted for dietary recommendations and recommend regular diet and thin liquids. -Scheduled Reglan started 6/13.  As discussed with pulmonology, may need to continue for a few days to determine response. -Ongoing issues with upper airway wheezing.  Increased Reglan to include bedtime dose.  Minimized prednisone.  Pulmonology has added Flonase for possible postnasal drip which may be contributing.   DVT prophylaxis: Lovenox Code Status: full code. Per nurse report, patient wants to be full code.  Family Communication: None at bedside today. Disposition Plan: Home pending further clinical improvement, hopefully within the next day or 2.  Consultants:   CCM  [facility]   Procedures:   Doppler LE.   Antimicrobials:  Azithromycin   Subjective: Patient seen.  Patient continues to wheeze.  Will check peak flow today.    Objective: Vitals:   10/12/17 2109 10/13/17 0403 10/13/17  0409 10/13/17 0800  BP: (!) 146/72  (!) 172/82   Pulse: (!) 115  78   Resp:      Temp: 98.4 F (36.9 C)  98 F (36.7 C) 98.6 F (37 C)  TempSrc: Oral  Oral Oral  SpO2: 97%  99%   Weight:  78.6 kg (173 lb 4.8 oz)    Height:          Intake/Output Summary (Last 24 hours) at 10/13/2017 1118 Last data filed at 10/13/2017 0900 Gross per 24 hour  Intake 120 ml  Output -  Net 120 ml   Filed Weights   10/11/17 0441 10/12/17 0437 10/13/17 0403  Weight: 79.5 kg (175 lb 3.2 oz) 79.2 kg (174 lb 8 oz) 78.6 kg (173 lb 4.8 oz)    Examination:  General exam: Pleasant middle-aged female, moderately built and overweight sitting up in chair this morning with some audible wheezing. Respiratory system: Decreased air entry, with mild expiratory wheeze.   Cardiovascular system: S1 and S2 heard, RRR.  No JVD, murmurs or pedal edema.  Telemetry personally reviewed: Sinus rhythm.  Stable Gastrointestinal system: Abdomen is nondistended, soft and nontender.  Normal bowel sounds heard.  Stable Central nervous system: Alert and oriented.  No focal neurological deficits. Extremities: symmetric power.  Skin: no rash      Data Reviewed: I have personally reviewed following labs and imaging studies  CBC: Recent Labs  Lab 10/07/17 0259 10/08/17 0333 10/09/17 0731  WBC 17.6* 18.0* 15.4*  HGB 9.5* 9.7* 9.9*  HCT 29.3* 30.6* 31.1*  MCV 97.3 98.1 97.8  PLT 274 287 403   Basic Metabolic Panel: Recent Labs  Lab 10/07/17 0259 10/08/17 0333 10/09/17 0731 10/10/17 0732  NA 136 136 134* 137  K 4.3 4.3 4.0 4.3  CL 101 100* 97* 99*  CO2 24 26 27 30   GLUCOSE 248* 251* 292* 227*  BUN 35* 39* 46* 35*  CREATININE 1.39* 1.37* 1.60* 1.17*  CALCIUM 8.1* 7.7* 7.3* 7.4*   GFR: Estimated Creatinine Clearance: 44.7 mL/min (A) (by C-G formula based on SCr of 1.17 mg/dL (H)). Liver Function Tests: No results for input(s): AST, ALT, ALKPHOS, BILITOT, PROT, ALBUMIN in the last 168 hours. Cardiac Enzymes: Recent Labs  Lab 10/06/17 1414  TROPONINI <0.03   CBG: Recent Labs  Lab 10/12/17 0601 10/12/17 1143 10/12/17 1718 10/12/17 2105 10/13/17 0603  GLUCAP 136* 138* 298* 312* 154*     Recent Results (from the past 240 hour(s))   Respiratory Panel by PCR     Status: None   Collection Time: 10/07/17  1:18 PM  Result Value Ref Range Status   Adenovirus NOT DETECTED NOT DETECTED Final   Coronavirus 229E NOT DETECTED NOT DETECTED Final   Coronavirus HKU1 NOT DETECTED NOT DETECTED Final   Coronavirus NL63 NOT DETECTED NOT DETECTED Final   Coronavirus OC43 NOT DETECTED NOT DETECTED Final   Metapneumovirus NOT DETECTED NOT DETECTED Final   Rhinovirus / Enterovirus NOT DETECTED NOT DETECTED Final   Influenza A NOT DETECTED NOT DETECTED Final   Influenza B NOT DETECTED NOT DETECTED Final   Parainfluenza Virus 1 NOT DETECTED NOT DETECTED Final   Parainfluenza Virus 2 NOT DETECTED NOT DETECTED Final   Parainfluenza Virus 3 NOT DETECTED NOT DETECTED Final   Parainfluenza Virus 4 NOT DETECTED NOT DETECTED Final   Respiratory Syncytial Virus NOT DETECTED NOT DETECTED Final   Bordetella pertussis NOT DETECTED NOT DETECTED Final   Chlamydophila pneumoniae NOT DETECTED NOT DETECTED Final  Mycoplasma pneumoniae NOT DETECTED NOT DETECTED Final    Comment: Performed at [facility], [place] [address], [place], [place] [address]  Culture, expectorated sputum-assessment     Status: None   Collection Time: 10/08/17 12:15 PM  Result Value Ref Range Status   Specimen Description SPUTUM  Final   Special Requests NONE  Final   Sputum evaluation   Final    THIS SPECIMEN IS ACCEPTABLE FOR SPUTUM CULTURE Performed at [facility], [place] [address], [place], [place] [address]    Report Status 10/08/2017 FINAL  Final  Culture, respiratory (NON-Expectorated)     Status: None   Collection Time: 10/08/17 12:15 PM  Result Value Ref Range Status   Specimen Description SPUTUM  Final   Special Requests NONE Reflexed from [id number]  Final   Gram Stain   Final    RARE WBC PRESENT, PREDOMINANTLY PMN RARE SQUAMOUS EPITHELIAL CELLS PRESENT MODERATE GRAM POSITIVE COCCI IN PAIRS IN CHAINS IN CLUSTERS RARE GRAM VARIABLE ROD    Culture    Final    MODERATE Consistent with normal respiratory flora. Performed at [facility], [place] [address], [place],  [address]    Report Status 10/10/2017 FINAL  Final         Radiology Studies: No results found.      Scheduled Meds: . aspirin EC  81 mg Oral Daily  . atorvastatin  40 mg Oral Daily  . budesonide (PULMICORT) nebulizer solution  0.5 mg Nebulization BID  . diltiazem  240 mg Oral Daily  . divalproex  1,000 mg Oral QHS  . enoxaparin (LOVENOX) injection  40 mg Subcutaneous Q24H  . esomeprazole  40 mg Oral BID AC  . famotidine  10 mg Oral Daily  . feeding supplement (GLUCERNA SHAKE)  237 mL Oral BID BM  . fluticasone  2 spray Each Nare Daily  . guaiFENesin  600 mg Oral BID  . hydrALAZINE  25 mg Oral BID  . insulin aspart  0-20 Units Subcutaneous TID WC  . insulin aspart  0-5 Units Subcutaneous QHS  . insulin glargine  20 Units Subcutaneous Daily  . ipratropium-albuterol  3 mL Nebulization BID  . loratadine  10 mg Oral Daily  . metoCLOPramide  5 mg Oral TID AC & HS  . nebivolol  5 mg Oral Daily  . predniSONE  30 mg Oral Q breakfast  . torsemide  20 mg Oral Daily   Continuous Infusions:    LOS: 7 days    Time spent: 30 minutes.    Bonnell Public, MD. [facility] Pager [phone].  If 7PM-7AM, please contact night-coverage [email or web address] Password [email or web address] 10/13/2017, 11:18 AM

## 2017-10-14 LAB — GLUCOSE, CAPILLARY
GLUCOSE-CAPILLARY: 82 mg/dL (ref 65–99)
GLUCOSE-CAPILLARY: 207 mg/dL — AB (ref 65–99)
GLUCOSE-CAPILLARY: 185 mg/dL — AB (ref 65–99)
Glucose-Capillary: 306 mg/dL — ABNORMAL HIGH (ref 65–99)

## 2017-10-14 MED ORDER — DOCUSATE SODIUM 100 MG PO CAPS: 100.0000 mg | ORAL_CAPSULE | Freq: Every day | ORAL | Status: DC | PRN

## 2017-10-14 MED ORDER — POLYETHYLENE GLYCOL 3350 17 G PO PACK
17.0000 g | PACK | Freq: Every day | ORAL | Status: DC
  Administered 2017-10-14 – 2017-10-18 (×5): 17 g via ORAL
  Filled 2017-10-14 (×5): qty 1

## 2017-10-14 MED ORDER — PREDNISONE 20 MG PO TABS
30.0000 mg | ORAL_TABLET | Freq: Every day | ORAL | Status: DC
  Administered 2017-10-14: 30 mg via ORAL
  Filled 2017-10-14 (×2): qty 1

## 2017-10-14 MED ORDER — DOCUSATE SODIUM 100 MG PO CAPS
100.0000 mg | ORAL_CAPSULE | Freq: Two times a day (BID) | ORAL | Status: DC
  Administered 2017-10-14 – 2017-10-18 (×9): 100 mg via ORAL
  Filled 2017-10-14 (×9): qty 1

## 2017-10-14 MED ORDER — PREDNISONE 20 MG PO TABS: 30.0000 mg | ORAL_TABLET | Freq: Every day | ORAL | Status: DC

## 2017-10-14 MED ORDER — MONTELUKAST SODIUM 10 MG PO TABS
10.0000 mg | ORAL_TABLET | Freq: Every day | ORAL | Status: DC
  Administered 2017-10-14 – 2017-10-18 (×5): 10 mg via ORAL
  Filled 2017-10-14 (×5): qty 1

## 2017-10-14 MED ORDER — IPRATROPIUM-ALBUTEROL 0.5-2.5 (3) MG/3ML IN SOLN
3.0000 mL | Freq: Four times a day (QID) | RESPIRATORY_TRACT | Status: DC
  Administered 2017-10-14 – 2017-10-15 (×3): 3 mL via RESPIRATORY_TRACT
  Filled 2017-10-14 (×3): qty 3

## 2017-10-14 MED ORDER — METHYLPREDNISOLONE SODIUM SUCC 40 MG IJ SOLR
40.0000 mg | Freq: Two times a day (BID) | INTRAMUSCULAR | Status: DC
  Administered 2017-10-14 – 2017-10-16 (×4): 40 mg via INTRAVENOUS
  Filled 2017-10-14 (×4): qty 1

## 2017-10-14 NOTE — Plan of Care (Signed)
  Problem: Clinical Measurements: Goal: Respiratory complications will improve Outcome: Progressing   Problem: Pain Managment: Goal: General experience of comfort will improve Outcome: Progressing   

## 2017-10-14 NOTE — Progress Notes (Signed)
PROGRESS NOTE    [name]  RDE:[id number] DOB: [birth year] DOA: 10/05/2017 PCP: [name], MD    Brief Summary:  [name] is a 67 y.o. female with medical history significant of asthma, type 2 diabetes, hypertension, hyperlipidemia, pulmonary emboli, depression, coronary artery disease, stent placement following a heart attack in September 2017, seizure disorder with occasional episodes of confusion, chronic diastolic CHF who presented with URI with cough, dyspnea, chest tightness. She has been using her nebulizer treatment and prednisone without improvement. She has sick contact.  Admitted for acute asthma exacerbation, slow to resolve and hence PCCM consulted.  [facility] consulted for LPR.  10/14/2017: Peak flow today is 200.  We will change oral prednisone to IV steroids.  Await add Singulair.  We will continue to monitor peak flow daily.  Assessment & Plan:   Principal Problem:   Asthma exacerbation Active Problems:   Depression with anxiety   Essential hypertension   G E R D   PULMONARY EMBOLISM, HX OF   Diabetes mellitus type 2, controlled ([place])   Acute bronchitis with asthma with acute exacerbation   Chronic diastolic CHF (congestive heart failure) (HCC)   Macrocytic anemia  Acute asthma exacerbation RSV panel & d-dimer negative. Treated with oxygen, IV Solu-Medrol, duo nebs, Pulmicort.  Received IV magnesium Despite treatment, continued to have significant wheezing.  CCM consulted and follow-up appreciated.  They suspect that acid reflux is playing a big component now with her persistent symptoms.   Now on oral prednisone.  Complete course of azithromycin.  Tapering prednisone. Ongoing issues with upper airway wheezing, please see below. -Peak flow today (10/13/2017) is said to be less than 100. -Peak flow on 10/14/2017 was 200.  DC prednisone.  Restart IV Solu-Medrol.  Add Singulair.  Acute Respiratory failure with hypoxia;  Related to asthma exacerbation.    Not hypoxic on room air at rest.    Anemia;  Anemia panel; iron 72, ferritin 135 B-12: 589. Might need iron trial.  Hb stable.   CAD;  Continue with aspirin, lipitor, Bystolic.  Asymptomatic of ischemic symptoms.  Chest pain;  Enzymes; negative.  D dimer negative.  Related to asthma exacerbation.  Resolved.  Acute on chronic Diastolic HF;  Received IV Lasix earlier on.  Remains on torsemide.  Clinically does not appear volume overloaded.  History of seizure;  Continue with Depakote.   DM; type 2.  Uncontrolled despite Lantus and SSI.  Likely related to steroids.  Increase Lantus from 15 to 20 units daily.  CBGs better today.  With reduction in steroid dose, may need to decrease Lantus, monitor closely.  AKI;  Resolved.  GERD -  [facility] input appreciated and recommending considering GES or 24-hour pH probe as outpatient, consider erythromycin or Reglan.  Continue Pepcid, Maalox and Nexium. - MBS report appreciated.  Speech therapy consulted for dietary recommendations and recommend regular diet and thin liquids. -Scheduled Reglan started 6/13.  As discussed with pulmonology, may need to continue for a few days to determine response. -Ongoing issues with upper airway wheezing.  Increased Reglan to include bedtime dose.  Minimized prednisone.  Pulmonology has added Flonase for possible postnasal drip which may be contributing.   DVT prophylaxis: Lovenox Code Status: full code. Per nurse report, patient wants to be full code.  Family Communication: None at bedside today. Disposition Plan: Home pending further clinical improvement, hopefully within the next day or 2.  Consultants:   CCM  [facility]   Procedures:  Doppler LE.   Antimicrobials:  Azithromycin   Subjective: Patient seen.  Patient continues to wheeze.  Continue to monitor peak flow.  Continue to monitor peak flow.   Objective: Vitals:   10/14/17 0846 10/14/17 0851 10/14/17 1256 10/14/17 1411  BP:  (!) 142/59  129/71   Pulse:      Resp:      Temp: 98.1 F (36.7 C)  98.1 F (36.7 C)   TempSrc: Oral  Oral   SpO2: 100% 100% 100% 96%  Weight:      Height:        Intake/Output Summary (Last 24 hours) at 10/14/2017 1436 Last data filed at 10/14/2017 1411 Gross per 24 hour  Intake 580 ml  Output 2851 ml  Net -2271 ml   Filed Weights   10/12/17 0437 10/13/17 0403 10/14/17 0517  Weight: 79.2 kg (174 lb 8 oz) 78.6 kg (173 lb 4.8 oz) 77.7 kg (171 lb 6.4 oz)    Examination:  General exam: Pleasant middle-aged female, moderately built and overweight sitting up in chair this morning with some audible wheezing. Respiratory system: Decreased air entry, with inspiratory and expiratory wheeze.     Cardiovascular system: S1 and S2 heard, RRR.  No JVD, murmurs or pedal edema.  Telemetry personally reviewed: Sinus rhythm.  Stable Gastrointestinal system: Abdomen is nondistended, soft and nontender.  Normal bowel sounds heard.  Stable Central nervous system: Alert and oriented.  No focal neurological deficits. Extremities: symmetric power.  Skin: no rash      Data Reviewed: I have personally reviewed following labs and imaging studies  CBC: Recent Labs  Lab 10/08/17 0333 10/09/17 0731  WBC 18.0* 15.4*  HGB 9.7* 9.9*  HCT 30.6* 31.1*  MCV 98.1 97.8  PLT 287 694   Basic Metabolic Panel: Recent Labs  Lab 10/08/17 0333 10/09/17 0731 10/10/17 0732  NA 136 134* 137  K 4.3 4.0 4.3  CL 100* 97* 99*  CO2 26 27 30   GLUCOSE 251* 292* 227*  BUN 39* 46* 35*  CREATININE 1.37* 1.60* 1.17*  CALCIUM 7.7* 7.3* 7.4*   GFR: Estimated Creatinine Clearance: 44.4 mL/min (A) (by C-G formula based on SCr of 1.17 mg/dL (H)). Liver Function Tests: No results for input(s): AST, ALT, ALKPHOS, BILITOT, PROT, ALBUMIN in the last 168 hours. Cardiac Enzymes: No results for input(s): CKTOTAL, CKMB, CKMBINDEX, TROPONINI in the last 168 hours. CBG: Recent Labs  Lab 10/13/17 1224 10/13/17 1638  10/13/17 2133 10/14/17 0624 10/14/17 1117  GLUCAP 198* 197* 164* 82 207*     Recent Results (from the past 240 hour(s))  Respiratory Panel by PCR     Status: None   Collection Time: 10/07/17  1:18 PM  Result Value Ref Range Status   Adenovirus NOT DETECTED NOT DETECTED Final   Coronavirus 229E NOT DETECTED NOT DETECTED Final   Coronavirus HKU1 NOT DETECTED NOT DETECTED Final   Coronavirus NL63 NOT DETECTED NOT DETECTED Final   Coronavirus OC43 NOT DETECTED NOT DETECTED Final   Metapneumovirus NOT DETECTED NOT DETECTED Final   Rhinovirus / Enterovirus NOT DETECTED NOT DETECTED Final   Influenza A NOT DETECTED NOT DETECTED Final   Influenza B NOT DETECTED NOT DETECTED Final   Parainfluenza Virus 1 NOT DETECTED NOT DETECTED Final   Parainfluenza Virus 2 NOT DETECTED NOT DETECTED Final   Parainfluenza Virus 3 NOT DETECTED NOT DETECTED Final   Parainfluenza Virus 4 NOT DETECTED NOT DETECTED Final   Respiratory Syncytial Virus NOT DETECTED NOT DETECTED Final  Bordetella pertussis NOT DETECTED NOT DETECTED Final   Chlamydophila pneumoniae NOT DETECTED NOT DETECTED Final   Mycoplasma pneumoniae NOT DETECTED NOT DETECTED Final    Comment: Performed at [facility], [place] [address], [place], [place] [address]  Culture, expectorated sputum-assessment     Status: None   Collection Time: 10/08/17 12:15 PM  Result Value Ref Range Status   Specimen Description SPUTUM  Final   Special Requests NONE  Final   Sputum evaluation   Final    THIS SPECIMEN IS ACCEPTABLE FOR SPUTUM CULTURE Performed at [facility], [place] [address], [place], [place] [address]    Report Status 10/08/2017 FINAL  Final  Culture, respiratory (NON-Expectorated)     Status: None   Collection Time: 10/08/17 12:15 PM  Result Value Ref Range Status   Specimen Description SPUTUM  Final   Special Requests NONE Reflexed from [id number]  Final   Gram Stain   Final    RARE WBC PRESENT, PREDOMINANTLY PMN RARE SQUAMOUS  EPITHELIAL CELLS PRESENT MODERATE GRAM POSITIVE COCCI IN PAIRS IN CHAINS IN CLUSTERS RARE GRAM VARIABLE ROD    Culture   Final    MODERATE Consistent with normal respiratory flora. Performed at [facility], [place] [address] [place], [place] [address]    Report Status 10/10/2017 FINAL  Final         Radiology Studies: No results found.      Scheduled Meds: . aspirin EC  81 mg Oral Daily  . atorvastatin  40 mg Oral Daily  . budesonide (PULMICORT) nebulizer solution  0.5 mg Nebulization BID  . diltiazem  240 mg Oral Daily  . divalproex  1,000 mg Oral QHS  . docusate sodium  100 mg Oral BID  . enoxaparin (LOVENOX) injection  40 mg Subcutaneous Q24H  . esomeprazole  40 mg Oral BID AC  . famotidine  10 mg Oral Daily  . feeding supplement (GLUCERNA SHAKE)  237 mL Oral BID BM  . fluticasone  2 spray Each Nare Daily  . guaiFENesin  600 mg Oral BID  . hydrALAZINE  25 mg Oral BID  . insulin aspart  0-20 Units Subcutaneous TID WC  . insulin aspart  0-5 Units Subcutaneous QHS  . insulin glargine  20 Units Subcutaneous Daily  . ipratropium-albuterol  3 mL Nebulization Q6H  . loratadine  10 mg Oral Daily  . methylPREDNISolone (SOLU-MEDROL) injection  40 mg Intravenous Q12H  . metoCLOPramide  5 mg Oral TID AC & HS  . montelukast  10 mg Oral Daily  . nebivolol  5 mg Oral Daily  . polyethylene glycol  17 g Oral Daily  . torsemide  20 mg Oral Daily   Continuous Infusions:    LOS: 8 days    Time spent: 30 minutes.    Bonnell Public, MD. [facility] Pager [phone].  If 7PM-7AM, please contact night-coverage [email or web address] Password [facility] 10/14/2017, 2:36 PM

## 2017-10-15 DIAGNOSIS — E1165 Type 2 diabetes mellitus with hyperglycemia: Secondary | ICD-10-CM

## 2017-10-15 DIAGNOSIS — Z794 Long term (current) use of insulin: Secondary | ICD-10-CM

## 2017-10-15 LAB — GLUCOSE, CAPILLARY
GLUCOSE-CAPILLARY: 275 mg/dL — AB (ref 65–99)
Glucose-Capillary: 248 mg/dL — ABNORMAL HIGH (ref 65–99)
Glucose-Capillary: 252 mg/dL — ABNORMAL HIGH (ref 65–99)
Glucose-Capillary: 337 mg/dL — ABNORMAL HIGH (ref 65–99)

## 2017-10-15 MED ORDER — FERROUS SULFATE 325 (65 FE) MG PO TABS
325.0000 mg | ORAL_TABLET | Freq: Two times a day (BID) | ORAL | Status: DC
  Administered 2017-10-15 – 2017-10-18 (×6): 325 mg via ORAL
  Filled 2017-10-15 (×6): qty 1

## 2017-10-15 MED ORDER — IPRATROPIUM-ALBUTEROL 0.5-2.5 (3) MG/3ML IN SOLN
3.0000 mL | Freq: Three times a day (TID) | RESPIRATORY_TRACT | Status: DC
  Administered 2017-10-15 – 2017-10-18 (×8): 3 mL via RESPIRATORY_TRACT
  Filled 2017-10-15 (×10): qty 3

## 2017-10-15 MED ORDER — INSULIN GLARGINE 100 UNIT/ML ~~LOC~~ SOLN
25.0000 [IU] | Freq: Every day | SUBCUTANEOUS | Status: DC
  Administered 2017-10-16 – 2017-10-17 (×2): 25 [IU] via SUBCUTANEOUS
  Filled 2017-10-15 (×2): qty 0.25

## 2017-10-15 NOTE — Progress Notes (Signed)
Patient's nose started bleeding this AM. Patient became lightheaded as she stepped off the scale while getting her daily weight. Patient stated that, "she was alright". B/P 166/75. Patient also had an episode yesterday morning but stated that, "she had some clots in her nose and once she got them out, her nose stopped bleeding". Because of the episode of the patient getting lightheaded, advised patient not to get up to walk unless she was with a staff member. Patient agreed. Will continue to monitor.

## 2017-10-15 NOTE — Progress Notes (Signed)
Peak flow Pre tx 260 Post tx decreased 220 Pt educated on peak flow red. Yellow, green zones. Will cont to monitor.

## 2017-10-15 NOTE — Progress Notes (Signed)
Results for ENA, DEMARY (MRN [id number]) as of 10/15/2017 07:54  Ref. Range 10/14/2017 11:17 10/14/2017 16:20 10/14/2017 21:14 10/15/2017 06:09  Glucose-Capillary Latest Ref Range: 65 - 99 mg/dL 207 (H) 306 (H) 185 (H) 248 (H)  Noted that fasting blood sugar is 248 mg/dl. Recommend increasing Lantus to 25 units daily and continuing Novolog RESISTANT correction scale as ordered. Will continue to monitor blood sugars while in the hospital.  Harvel Ricks RN BSN CDE Diabetes Coordinator Pager: [phone]  8am-5pm

## 2017-10-15 NOTE — Progress Notes (Signed)
PROGRESS NOTE                                                                                                                                                                                                             Patient Demographics:    [name], is a 67 y.o. female, DOB - [birth year], OXB:[id number]  Admit date - 10/05/2017   Admitting Physician Toy Baker, MD  Outpatient Primary MD for the patient is [name], MD  LOS - 9  Outpatient Specialists:[facility]  Chief Complaint  Patient presents with  . Asthma       Brief Narrative 67 year old female with severe asthma, type 2 diabetes mellitus, hypertension, hyperlipidemia, history of PE, depression, CAD status post stent following MI in 12/2015, seizure disorder with occasional episodes of confusion, chronic diastolic CHF presented with URI symptoms associated with cough, chest tightness and dyspnea.  She was using nebulizers and prednisone at home without improvement.  Patient admitted for acute asthma exacerbation with slow improvement.  PCCM consulted for further management.  [facility] also consulted for laryngopharyngeal reflux.   Subjective:   Still having some cough but dyspnea is better.  Peak flow today remains at 200.  Had an episode of epistaxis this morning and felt dizzy.   Assessment  & Plan :    Principal Problem: Acute asthma exacerbation Continue IV Solu-Medrol, duo nebs Pulmicort.  Had wheezing despite adequate treatment.  Appreciate PCCM consult.  Suspected acid reflux is largely contributing to her persistent symptoms.  Was on oral prednisone, switch to IV Solu-Medrol given persistent symptoms.  Completed azithromycin.  Peak flow of 200 last 2 days, plan on having it >400 before she can be discharged.  Plan on slow prednisone taper tomorrow morning if symptoms better. Continue nebs.  Added Singulair.    Active Problems: Acute  respiratory failure with hypoxia Secondary to asthma exacerbation.  Now stable on room air.  GERD with laryngeal pharyngeal reflux (LPR) [facility] consult.  Considering GES/24-hour pH probe as outpatient.  Received continue Pepcid, Maalox and Nexium. Seen by SLP and had MBS, recommended regular diet with thin liquid.  Started scheduled Reglan on 6/13 (increased dose).  Added Flonase for possible postnasal drip.   Iron deficiency anemia add iron supplement.  Acute on chronic diastolic CHF Received IV Lasix during hospital stay.  Continue  torsemide.  Euvolemic.  History of seizures Continue Depakote  Uncontrolled type 2 diabetes mellitus with hyperglycemia Likely associated with steroid use.  Increase Lantus further to 25 units daily and resistant sliding scale coverage.   Essential hypertension/chronic diastolic CHF On multiple medications including Cardizem, Bystolic, hydralazine and torsemide    Code Status : Full code  Family Communication  : None at bedside  Disposition Plan  : Home in 1-2 days if peak flow improved  Barriers For Discharge : Active symptoms  Consults  : [facility]monary/[facility]  Procedures  : MBS  DVT Prophylaxis  :  Lovenox -   Lab Results  Component Value Date   PLT 289 10/09/2017    Antibiotics    Anti-infectives (From admission, onward)   Start     Dose/Rate Route Frequency Ordered Stop   10/06/17 1000  azithromycin (ZITHROMAX) tablet 500 mg     500 mg Oral Daily 10/06/17 0349 10/08/17 0919        Objective:   Vitals:   10/14/17 2150 10/14/17 2347 10/15/17 0225 10/15/17 0420  BP:  (!) 143/62  (!) 154/72  Pulse:  90  (!) 104  Resp:      Temp:  (!) 97.3 F (36.3 C)  98.2 F (36.8 C)  TempSrc:  Oral  Oral  SpO2: 97% 98% 97% 96%  Weight:    78.1 kg (172 lb 1.6 oz)  Height:        Wt Readings from Last 3 Encounters:  10/15/17 78.1 kg (172 lb 1.6 oz)  09/18/17 72.6 kg (160 lb)  09/17/17 73.4 kg (161 lb 12.8 oz)     Intake/Output  Summary (Last 24 hours) at 10/15/2017 1137 Last data filed at 10/15/2017 0435 Gross per 24 hour  Intake 460 ml  Output 2000 ml  Net -1540 ml     Physical Exam  Gen: not in distress HEENT:  moist mucosa, supple neck Chest: Few expiratory wheezing bilaterally CVS: N S1&S2, no murmurs,  GI: soft, NT, ND,  Musculoskeletal: warm, no edema     Data Review:    CBC Recent Labs  Lab 10/09/17 0731  WBC 15.4*  HGB 9.9*  HCT 31.1*  PLT 289  MCV 97.8  MCH 31.1  MCHC 31.8  RDW 13.9    Chemistries  Recent Labs  Lab 10/09/17 0731 10/10/17 0732  NA 134* 137  K 4.0 4.3  CL 97* 99*  CO2 27 30  GLUCOSE 292* 227*  BUN 46* 35*  CREATININE 1.60* 1.17*  CALCIUM 7.3* 7.4*   ------------------------------------------------------------------------------------------------------------------ No results for input(s): CHOL, HDL, LDLCALC, TRIG, CHOLHDL, LDLDIRECT in the last 72 hours.  Lab Results  Component Value Date   HGBA1C 7.3 (H) 10/06/2017   ------------------------------------------------------------------------------------------------------------------ No results for input(s): TSH, T4TOTAL, T3FREE, THYROIDAB in the last 72 hours.  Invalid input(s): FREET3 ------------------------------------------------------------------------------------------------------------------ No results for input(s): VITAMINB12, FOLATE, FERRITIN, TIBC, IRON, RETICCTPCT in the last 72 hours.  Coagulation profile No results for input(s): INR, PROTIME in the last 168 hours.  No results for input(s): DDIMER in the last 72 hours.  Cardiac Enzymes No results for input(s): CKMB, TROPONINI, MYOGLOBIN in the last 168 hours.  Invalid input(s): CK ------------------------------------------------------------------------------------------------------------------    Component Value Date/Time   BNP 30.0 10/05/2017 1625   BNP <4.0 01/26/2016 1608    Inpatient Medications  Scheduled Meds: . aspirin  EC  81 mg Oral Daily  . atorvastatin  40 mg Oral Daily  . budesonide (PULMICORT) nebulizer solution  0.5 mg Nebulization  BID  . diltiazem  240 mg Oral Daily  . divalproex  1,000 mg Oral QHS  . docusate sodium  100 mg Oral BID  . enoxaparin (LOVENOX) injection  40 mg Subcutaneous Q24H  . esomeprazole  40 mg Oral BID AC  . famotidine  10 mg Oral Daily  . feeding supplement (GLUCERNA SHAKE)  237 mL Oral BID BM  . fluticasone  2 spray Each Nare Daily  . guaiFENesin  600 mg Oral BID  . hydrALAZINE  25 mg Oral BID  . insulin aspart  0-20 Units Subcutaneous TID WC  . insulin aspart  0-5 Units Subcutaneous QHS  . insulin glargine  20 Units Subcutaneous Daily  . ipratropium-albuterol  3 mL Nebulization TID  . loratadine  10 mg Oral Daily  . methylPREDNISolone (SOLU-MEDROL) injection  40 mg Intravenous Q12H  . metoCLOPramide  5 mg Oral TID AC & HS  . montelukast  10 mg Oral Daily  . nebivolol  5 mg Oral Daily  . polyethylene glycol  17 g Oral Daily  . torsemide  20 mg Oral Daily   Continuous Infusions: PRN Meds:.acetaminophen **OR** acetaminophen, ALPRAZolam, alum & mag hydroxide-simeth, docusate sodium, HYDROcodone-acetaminophen, ipratropium-albuterol, ondansetron **OR** ondansetron (ZOFRAN) IV  Micro Results Recent Results (from the past 240 hour(s))  Respiratory Panel by PCR     Status: None   Collection Time: 10/07/17  1:18 PM  Result Value Ref Range Status   Adenovirus NOT DETECTED NOT DETECTED Final   Coronavirus 229E NOT DETECTED NOT DETECTED Final   Coronavirus HKU1 NOT DETECTED NOT DETECTED Final   Coronavirus NL63 NOT DETECTED NOT DETECTED Final   Coronavirus OC43 NOT DETECTED NOT DETECTED Final   Metapneumovirus NOT DETECTED NOT DETECTED Final   Rhinovirus / Enterovirus NOT DETECTED NOT DETECTED Final   Influenza A NOT DETECTED NOT DETECTED Final   Influenza B NOT DETECTED NOT DETECTED Final   Parainfluenza Virus 1 NOT DETECTED NOT DETECTED Final   Parainfluenza Virus 2  NOT DETECTED NOT DETECTED Final   Parainfluenza Virus 3 NOT DETECTED NOT DETECTED Final   Parainfluenza Virus 4 NOT DETECTED NOT DETECTED Final   Respiratory Syncytial Virus NOT DETECTED NOT DETECTED Final   Bordetella pertussis NOT DETECTED NOT DETECTED Final   Chlamydophila pneumoniae NOT DETECTED NOT DETECTED Final   Mycoplasma pneumoniae NOT DETECTED NOT DETECTED Final    Comment: Performed at [facility], [address], [place], [place] [address]  Culture, expectorated sputum-assessment     Status: None   Collection Time: 10/08/17 12:15 PM  Result Value Ref Range Status   Specimen Description SPUTUM  Final   Special Requests NONE  Final   Sputum evaluation   Final    THIS SPECIMEN IS ACCEPTABLE FOR SPUTUM CULTURE Performed at [facility], [place] [address], [place], [place] [address]    Report Status 10/08/2017 FINAL  Final  Culture, respiratory (NON-Expectorated)     Status: None   Collection Time: 10/08/17 12:15 PM  Result Value Ref Range Status   Specimen Description SPUTUM  Final   Special Requests NONE Reflexed from [id number]  Final   Gram Stain   Final    RARE WBC PRESENT, PREDOMINANTLY PMN RARE SQUAMOUS EPITHELIAL CELLS PRESENT MODERATE GRAM POSITIVE COCCI IN PAIRS IN CHAINS IN CLUSTERS RARE GRAM VARIABLE ROD    Culture   Final    MODERATE Consistent with normal respiratory flora. Performed at [facility], [place] [address] [place],  [address]  Report Status 10/10/2017 FINAL  Final    Radiology Reports Dg Chest 2 View  Result Date: 10/05/2017 CLINICAL DATA:  Asthma exacerbation. Cough for 2 days. Hypertension. Ex-smoker. EXAM: CHEST - 2 VIEW COMPARISON:  08/12/2017 FINDINGS: Midline trachea. Mild cardiomegaly. Tortuous thoracic aorta. No pleural effusion or pneumothorax. Clear lungs. IMPRESSION: Mild cardiomegaly, without acute disease. Electronically Signed   By: Abigail Miyamoto M.D.   On: 10/05/2017 15:59   Dg Esophagus  Result Date:  10/09/2017 CLINICAL DATA:  Shortness of breath, bronchitis, wheezing, asthma exacerbation. Heartburn. Occasionally feels that solids are slow to go down. EXAM: ESOPHOGRAM / BARIUM SWALLOW / BARIUM TABLET STUDY TECHNIQUE: Combined double contrast and single contrast examination performed using effervescent crystals, thick barium liquid, and thin barium liquid. The patient was observed with fluoroscopy swallowing a 13 mm barium sulphate tablet. FLUOROSCOPY TIME:  Fluoroscopy Time:  1 minutes 18 seconds Radiation Exposure Index (if provided by the fluoroscopic device): Number of Acquired Spot Images: 0 COMPARISON:  None. FINDINGS: Laryngeal penetration with swallowing, no aspiration. Esophageal motility is within normal limits. No esophageal fold thickening, stricture or obstruction. A 13 mm barium tablet passed into the stomach without difficulty. IMPRESSION: 1. Low range of penetration without aspiration. Otherwise, normal exam. Electronically Signed   By: Lorin Picket M.D.   On: 10/09/2017 14:20   Dg Chest Port 1 View  Result Date: 10/09/2017 CLINICAL DATA:  Shortness of breath.  Wheezing this morning. EXAM: PORTABLE CHEST 1 VIEW COMPARISON:  Frontal and lateral views 4 days ago 10/05/2017. Additional priors FINDINGS: Lower lung volumes from prior exam leading to bronchovascular crowding. Similar mild cardiomegaly and unchanged mediastinal contours. Mild left greater than right basilar atelectasis. No confluent airspace disease. No large pleural effusion or pneumothorax. Unchanged osseous structures. IMPRESSION: Hypoventilatory chest. Similar cardiomegaly and bibasilar atelectasis allowing for differences in technique. Electronically Signed   By: Jeb Levering M.D.   On: 10/09/2017 06:00    Time Spent in minutes  25   Deette Revak M.D on 10/15/2017 at 11:37 AM  Between 7am to 7pm - Pager - [phone]  After 7pm go to [email or web address] - password [facility]  [facility] -  Office   [phone]

## 2017-10-15 NOTE — Plan of Care (Signed)
  Problem: Clinical Measurements: Goal: Respiratory complications will improve Outcome: Not Progressing   

## 2017-10-15 NOTE — Plan of Care (Signed)
  Problem: Clinical Measurements: Goal: Ability to maintain clinical measurements within normal limits will improve Outcome: Progressing Goal: Diagnostic test results will improve Outcome: Progressing Goal: Respiratory complications will improve Outcome: Progressing   

## 2017-10-15 NOTE — Progress Notes (Signed)
RT Note:  Pre Peak Flow   250 Post Peak Flow  230  Patient displayed good technique and effort.

## 2017-10-16 ENCOUNTER — Encounter: Payer: Medicare Other | Admitting: Adult Health

## 2017-10-16 DIAGNOSIS — F418 Other specified anxiety disorders: Secondary | ICD-10-CM

## 2017-10-16 LAB — GLUCOSE, CAPILLARY
GLUCOSE-CAPILLARY: 276 mg/dL — AB (ref 65–99)
GLUCOSE-CAPILLARY: 438 mg/dL — AB (ref 65–99)
Glucose-Capillary: 197 mg/dL — ABNORMAL HIGH (ref 65–99)
Glucose-Capillary: 404 mg/dL — ABNORMAL HIGH (ref 65–99)

## 2017-10-16 LAB — BASIC METABOLIC PANEL
ANION GAP: 8 (ref 5–15)
BUN: 27 mg/dL — AB (ref 6–20)
CO2: 31 mmol/L (ref 22–32)
Calcium: 9.4 mg/dL (ref 8.9–10.3)
Chloride: 96 mmol/L — ABNORMAL LOW (ref 101–111)
Creatinine, Ser: 1.13 mg/dL — ABNORMAL HIGH (ref 0.44–1.00)
GFR calc Af Amer: 57 mL/min — ABNORMAL LOW (ref >60)
GFR, EST NON AFRICAN AMERICAN: 49 mL/min — AB (ref >60)
Glucose, Bld: 256 mg/dL — ABNORMAL HIGH (ref 65–99)
POTASSIUM: 4.4 mmol/L (ref 3.5–5.1)
Sodium: 135 mmol/L (ref 135–145)

## 2017-10-16 MED ORDER — ARFORMOTEROL TARTRATE 15 MCG/2ML IN NEBU
15.0000 ug | INHALATION_SOLUTION | Freq: Two times a day (BID) | RESPIRATORY_TRACT | Status: DC
  Administered 2017-10-16 – 2017-10-18 (×4): 15 ug via RESPIRATORY_TRACT
  Filled 2017-10-16 (×4): qty 2

## 2017-10-16 MED ORDER — METHYLPREDNISOLONE SODIUM SUCC 125 MG IJ SOLR
60.0000 mg | Freq: Two times a day (BID) | INTRAMUSCULAR | Status: DC
  Administered 2017-10-16 – 2017-10-18 (×4): 60 mg via INTRAVENOUS
  Filled 2017-10-16 (×5): qty 2

## 2017-10-16 MED ORDER — INSULIN ASPART 100 UNIT/ML ~~LOC~~ SOLN
5.0000 [IU] | Freq: Three times a day (TID) | SUBCUTANEOUS | Status: DC
  Administered 2017-10-16 – 2017-10-17 (×2): 5 [IU] via SUBCUTANEOUS

## 2017-10-16 MED ORDER — INSULIN ASPART 100 UNIT/ML ~~LOC~~ SOLN
10.0000 [IU] | Freq: Once | SUBCUTANEOUS | Status: AC
  Administered 2017-10-16: 10 [IU] via SUBCUTANEOUS

## 2017-10-16 NOTE — Progress Notes (Signed)
  Speech Language Pathology Treatment: Dysphagia  Patient Details Name: STEPHINIE BATTISTI MRN: [id number] DOB: [birth year] Today's Date: 10/16/2017 Time: 0813-8871 SLP Time Calculation (min) (ACUTE ONLY): 9 min  Assessment / Plan / Recommendation Clinical Impression  No s/s aspiration with regular texture/thin liquids. Pt stated esophageal precautions and SLP introduced two more with clinical reasoning. Continue diet and discharge ST.  HPI HPI: 67 year old female admitted 10/05/17 with asthma exacerbation. PMH: DM2, HTN, HLD, PE, depression, CAD, MI (2017), seizures, CHF, GERD. BaSw 10/09/17 = normal esophageal motility, penetration but no aspiration.      SLP Plan  All goals met       Recommendations  Diet recommendations: Regular;Thin liquid Liquids provided via: Cup;Straw Medication Administration: Whole meds with liquid Supervision: Patient able to self feed Compensations: Slow rate;Small sips/bites;Follow solids with liquid Postural Changes and/or Swallow Maneuvers: Seated upright 90 degrees;Upright 30-60 min after meal                Oral Care Recommendations: Oral care BID Follow up Recommendations: None SLP Visit Diagnosis: Dysphagia, pharyngoesophageal phase (R13.14) Plan: All goals met       GO                Houston Siren 10/16/2017, 12:53 PM   Orbie Pyo Colvin Caroli.Ed [employer] [phone]

## 2017-10-16 NOTE — Care Management Important Message (Signed)
Important Message  Patient Details  Name: [name] MRN: [id number] Date of Birth: 1951-01-25   Medicare Important Message Given:  Yes    Barb Merino [place] 10/16/2017, 3:18 PM

## 2017-10-16 NOTE — Progress Notes (Signed)
RT Note:  Pre Peak Flow  210 Post Peak Flow 170  Patient has good effort and technique.

## 2017-10-16 NOTE — Progress Notes (Signed)
[facility]                                                                              Patient Demographics  [name], is a 67 y.o. female, DOB - [birth year], GQQ:[id number]  Admit date - 10/05/2017   Admitting Physician Jacqueline Baker, MD  Outpatient Primary MD for the patient is Jacqueline Frees, MD  Outpatient specialists:   LOS - 10  days   Medical records reviewed and are as summarized below:    Chief Complaint  Patient presents with  . Asthma       Brief summary   67 year old female with severe asthma, type 2 diabetes mellitus, hypertension, hyperlipidemia, history of PE, depression, CAD status post stent following MI in 12/2015, seizure disorder with occasional episodes of confusion, chronic diastolic CHF presented with URI symptoms associated with cough, chest tightness and dyspnea.  She was using nebulizers and prednisone at home without improvement.  Patient admitted for acute asthma exacerbation with slow improvement.  PCCM consulted for further management.  [facility] also consulted for laryngopharyngeal reflux.  Assessment & Plan    Principal Problem:   Asthma exacerbation -Wheezing, continue IV Solu-Medrol, increase to 60 mg every 12 hours - Continue duo nebs, Pulmicort, added Brovana, Singulair, Flonase.  Completed azithromycin -Peak flow max up to 250 today   Active Problems: Acute respiratory failure with hypoxia -Improving, secondary to asthma exacerbation.  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 Continue Reglan  Acute on chronic diastolic CHF Received IV Lasix during hospital stay, currently on torsemide, euvolemic  History of seizures -Continue Depakote  Uncontrolled type 2 diabetes mellitus,  with hyperglycemia  -Continue Lantus 25 units daily, added meal coverage 5 units 3 times daily AC, continue NovoLog sliding scale  Code Status: Full code DVT Prophylaxis:  Lovenox  Family Communication: Discussed in detail with the patient, all imaging results, lab results explained to the patient    Disposition Plan:  DC in 1 or 2 days once wheezing is improved   Time Spent in minutes   35 minutes  Procedures:  None  Consultants:   GI Pulmonary  Antimicrobials:      Medications  Scheduled Meds: . aspirin EC  81 mg Oral Daily  . atorvastatin  40 mg Oral Daily  . budesonide (PULMICORT) nebulizer solution  0.5 mg Nebulization BID  . diltiazem  240 mg Oral Daily  . divalproex  1,000 mg Oral QHS  . docusate sodium  100 mg Oral BID  . enoxaparin (LOVENOX) injection  40 mg Subcutaneous Q24H  . esomeprazole  40 mg Oral BID AC  . famotidine  10 mg Oral Daily  . feeding supplement (GLUCERNA SHAKE)  237 mL Oral BID BM  . ferrous sulfate  325 mg Oral BID WC  . fluticasone  2 spray Each Nare Daily  . guaiFENesin  600 mg Oral BID  . hydrALAZINE  25  mg Oral BID  . insulin aspart  0-20 Units Subcutaneous TID WC  . insulin aspart  0-5 Units Subcutaneous QHS  . insulin glargine  25 Units Subcutaneous Daily  . ipratropium-albuterol  3 mL Nebulization TID  . loratadine  10 mg Oral Daily  . methylPREDNISolone (SOLU-MEDROL) injection  40 mg Intravenous Q12H  . metoCLOPramide  5 mg Oral TID AC & HS  . montelukast  10 mg Oral Daily  . nebivolol  5 mg Oral Daily  . polyethylene glycol  17 g Oral Daily  . torsemide  20 mg Oral Daily   Continuous Infusions: PRN Meds:.acetaminophen **OR** acetaminophen, ALPRAZolam, alum & mag hydroxide-simeth, docusate sodium, HYDROcodone-acetaminophen, ipratropium-albuterol, ondansetron **OR** ondansetron (ZOFRAN) IV   Antibiotics   Anti-infectives (From admission, onward)   Start     Dose/Rate Route Frequency Ordered Stop   10/06/17 1000   azithromycin (ZITHROMAX) tablet 500 mg     500 mg Oral Daily 10/06/17 0349 10/08/17 0919        Subjective:   [name] was seen and examined today.  Still wheezing, per patient has been progressively improving.  Still has wheezing and dyspnea on exertion.  No chest pain. Patient denies dizziness, abdominal pain, N/V/D/C, new weakness, numbess, tingling. No acute events overnight.    Objective:   Vitals:   10/15/17 1951 10/15/17 2310 10/16/17 0436 10/16/17 0838  BP: (!) 176/94  (!) 170/96   Pulse: 90 96 98   Resp: 16 15 20    Temp: 98.2 F (36.8 C) 98.6 F (37 C) 98.3 F (36.8 C)   TempSrc: Oral Oral Oral   SpO2: 98% 96% 100% 95%  Weight:   77.3 kg (170 lb 6.4 oz)   Height:        Intake/Output Summary (Last 24 hours) at 10/16/2017 1316 Last data filed at 10/16/2017 1200 Gross per 24 hour  Intake 1320 ml  Output 4350 ml  Net -3030 ml     Wt Readings from Last 3 Encounters:  10/16/17 77.3 kg (170 lb 6.4 oz)  09/18/17 72.6 kg (160 lb)  09/17/17 73.4 kg (161 lb 12.8 oz)     Exam  General: Alert and oriented x 3, NAD  Eyes:   HEENT:  Atraumatic, normocephalic, normal oropharynx  Cardiovascular: S1 S2 auscultated, no rubs, murmurs or gallops. Regular rate and rhythm.  Respiratory: Bilateral expiratory wheezing  Gastrointestinal: Soft, nontender, nondistended, + bowel sounds  Ext: no pedal edema bilaterally  Neuro: no FND  Musculoskeletal: No digital cyanosis, clubbing  Skin: No rashes  Psych: Normal affect and demeanor, alert and oriented x3    Data Reviewed:  I have personally reviewed following labs and imaging studies  Micro Results Recent Results (from the past 240 hour(s))  Respiratory Panel by PCR     Status: None   Collection Time: 10/07/17  1:18 PM  Result Value Ref Range Status   Adenovirus NOT DETECTED NOT DETECTED Final   Coronavirus 229E NOT DETECTED NOT DETECTED Final   Coronavirus HKU1 NOT DETECTED NOT DETECTED Final    Coronavirus NL63 NOT DETECTED NOT DETECTED Final   Coronavirus OC43 NOT DETECTED NOT DETECTED Final   Metapneumovirus NOT DETECTED NOT DETECTED Final   Rhinovirus / Enterovirus NOT DETECTED NOT DETECTED Final   Influenza A NOT DETECTED NOT DETECTED Final   Influenza B NOT DETECTED NOT DETECTED Final   Parainfluenza Virus 1 NOT DETECTED NOT DETECTED Final   Parainfluenza Virus 2 NOT DETECTED NOT DETECTED Final   Parainfluenza Virus  3 NOT DETECTED NOT DETECTED Final   Parainfluenza Virus 4 NOT DETECTED NOT DETECTED Final   Respiratory Syncytial Virus NOT DETECTED NOT DETECTED Final   Bordetella pertussis NOT DETECTED NOT DETECTED Final   Chlamydophila pneumoniae NOT DETECTED NOT DETECTED Final   Mycoplasma pneumoniae NOT DETECTED NOT DETECTED Final    Comment: Performed at [facility], [place] [address], [place], [place] [address]  Culture, expectorated sputum-assessment     Status: None   Collection Time: 10/08/17 12:15 PM  Result Value Ref Range Status   Specimen Description SPUTUM  Final   Special Requests NONE  Final   Sputum evaluation   Final    THIS SPECIMEN IS ACCEPTABLE FOR SPUTUM CULTURE Performed at [facility], [place] [address], [place], [place] [address]    Report Status 10/08/2017 FINAL  Final  Culture, respiratory (NON-Expectorated)     Status: None   Collection Time: 10/08/17 12:15 PM  Result Value Ref Range Status   Specimen Description SPUTUM  Final   Special Requests NONE Reflexed from [id number]  Final   Gram Stain   Final    RARE WBC PRESENT, PREDOMINANTLY PMN RARE SQUAMOUS EPITHELIAL CELLS PRESENT MODERATE GRAM POSITIVE COCCI IN PAIRS IN CHAINS IN CLUSTERS RARE GRAM VARIABLE ROD    Culture   Final    MODERATE Consistent with normal respiratory flora. Performed at [facility], [place] [address], [place], [place] [address]    Report Status 10/10/2017 FINAL  Final    Radiology Reports Dg Chest 2 View  Result Date: 10/05/2017 CLINICAL DATA:   Asthma exacerbation. Cough for 2 days. Hypertension. Ex-smoker. EXAM: CHEST - 2 VIEW COMPARISON:  08/12/2017 FINDINGS: Midline trachea. Mild cardiomegaly. Tortuous thoracic aorta. No pleural effusion or pneumothorax. Clear lungs. IMPRESSION: Mild cardiomegaly, without acute disease. Electronically Signed   By: Abigail Miyamoto M.D.   On: 10/05/2017 15:59   Dg Esophagus  Result Date: 10/09/2017 CLINICAL DATA:  Shortness of breath, bronchitis, wheezing, asthma exacerbation. Heartburn. Occasionally feels that solids are slow to go down. EXAM: ESOPHOGRAM / BARIUM SWALLOW / BARIUM TABLET STUDY TECHNIQUE: Combined double contrast and single contrast examination performed using effervescent crystals, thick barium liquid, and thin barium liquid. The patient was observed with fluoroscopy swallowing a 13 mm barium sulphate tablet. FLUOROSCOPY TIME:  Fluoroscopy Time:  1 minutes 18 seconds Radiation Exposure Index (if provided by the fluoroscopic device): Number of Acquired Spot Images: 0 COMPARISON:  None. FINDINGS: Laryngeal penetration with swallowing, no aspiration. Esophageal motility is within normal limits. No esophageal fold thickening, stricture or obstruction. A 13 mm barium tablet passed into the stomach without difficulty. IMPRESSION: 1. Low range of penetration without aspiration. Otherwise, normal exam. Electronically Signed   By: Lorin Picket M.D.   On: 10/09/2017 14:20   Dg Chest Port 1 View  Result Date: 10/09/2017 CLINICAL DATA:  Shortness of breath.  Wheezing this morning. EXAM: PORTABLE CHEST 1 VIEW COMPARISON:  Frontal and lateral views 4 days ago 10/05/2017. Additional priors FINDINGS: Lower lung volumes from prior exam leading to bronchovascular crowding. Similar mild cardiomegaly and unchanged mediastinal contours. Mild left greater than right basilar atelectasis. No confluent airspace disease. No large pleural effusion or pneumothorax. Unchanged osseous structures. IMPRESSION: Hypoventilatory  chest. Similar cardiomegaly and bibasilar atelectasis allowing for differences in technique. Electronically Signed   By: Jeb Levering M.D.   On: 10/09/2017 06:00    Lab Data:  CBC: No results for input(s): WBC, NEUTROABS, HGB, HCT, MCV, PLT  in the last 168 hours. Basic Metabolic Panel: Recent Labs  Lab 10/10/17 0732 10/16/17 0311  NA 137 135  K 4.3 4.4  CL 99* 96*  CO2 30 31  GLUCOSE 227* 256*  BUN 35* 27*  CREATININE 1.17* 1.13*  CALCIUM 7.4* 9.4   GFR: Estimated Creatinine Clearance: 45.9 mL/min (A) (by C-G formula based on SCr of 1.13 mg/dL (H)). Liver Function Tests: No results for input(s): AST, ALT, ALKPHOS, BILITOT, PROT, ALBUMIN in the last 168 hours. No results for input(s): LIPASE, AMYLASE in the last 168 hours. No results for input(s): AMMONIA in the last 168 hours. Coagulation Profile: No results for input(s): INR, PROTIME in the last 168 hours. Cardiac Enzymes: No results for input(s): CKTOTAL, CKMB, CKMBINDEX, TROPONINI in the last 168 hours. BNP (last 3 results) No results for input(s): PROBNP in the last 8760 hours. HbA1C: No results for input(s): HGBA1C in the last 72 hours. CBG: Recent Labs  Lab 10/15/17 1113 10/15/17 1627 10/15/17 2044 10/16/17 0615 10/16/17 1115  GLUCAP 275* 252* 337* 276* 197*   Lipid Profile: No results for input(s): CHOL, HDL, LDLCALC, TRIG, CHOLHDL, LDLDIRECT in the last 72 hours. Thyroid Function Tests: No results for input(s): TSH, T4TOTAL, FREET4, T3FREE, THYROIDAB in the last 72 hours. Anemia Panel: No results for input(s): VITAMINB12, FOLATE, FERRITIN, TIBC, IRON, RETICCTPCT in the last 72 hours. Urine analysis:    Component Value Date/Time   COLORURINE YELLOW 08/07/2014 [facility] 08/07/2014 1304   LABSPEC 1.012 08/07/2014 1304   PHURINE 5.0 08/07/2014 1304   GLUCOSEU NEGATIVE 08/07/2014 1304   HGBUR NEGATIVE 08/07/2014 1304   BILIRUBINUR NEGATIVE 08/07/2014 1304   KETONESUR NEGATIVE  08/07/2014 1304   PROTEINUR NEGATIVE 08/07/2014 1304   UROBILINOGEN 0.2 08/07/2014 1304   NITRITE NEGATIVE 08/07/2014 1304   LEUKOCYTESUR NEGATIVE 08/07/2014 1304     Lillyonna Armstead M.D. [facility] 10/16/2017, 1:16 PM  Pager: [phone] Between 7am to 7pm - call Pager - [phone]  After 7pm go to [email or web address] - password [email or web address]  Call night coverage person covering after 7pm

## 2017-10-17 DIAGNOSIS — J4551 Severe persistent asthma with (acute) exacerbation: Principal | ICD-10-CM

## 2017-10-17 LAB — GLUCOSE, CAPILLARY
GLUCOSE-CAPILLARY: 327 mg/dL — AB (ref 65–99)
GLUCOSE-CAPILLARY: 333 mg/dL — AB (ref 65–99)
Glucose-Capillary: 273 mg/dL — ABNORMAL HIGH (ref 65–99)
Glucose-Capillary: 169 mg/dL — ABNORMAL HIGH (ref 65–99)

## 2017-10-17 MED ORDER — INSULIN ASPART 100 UNIT/ML ~~LOC~~ SOLN
8.0000 [IU] | Freq: Three times a day (TID) | SUBCUTANEOUS | Status: DC
  Administered 2017-10-17 – 2017-10-18 (×3): 8 [IU] via SUBCUTANEOUS

## 2017-10-17 MED ORDER — INSULIN GLARGINE 100 UNIT/ML ~~LOC~~ SOLN
5.0000 [IU] | Freq: Once | SUBCUTANEOUS | Status: AC
  Administered 2017-10-17: 5 [IU] via SUBCUTANEOUS
  Filled 2017-10-17: qty 0.05

## 2017-10-17 MED ORDER — INSULIN GLARGINE 100 UNIT/ML ~~LOC~~ SOLN
32.0000 [IU] | Freq: Every day | SUBCUTANEOUS | Status: DC
  Administered 2017-10-18: 32 [IU] via SUBCUTANEOUS
  Filled 2017-10-17: qty 0.32

## 2017-10-17 NOTE — Plan of Care (Signed)
  Problem: Clinical Measurements: Goal: Ability to maintain clinical measurements within normal limits will improve Outcome: Progressing Goal: Diagnostic test results will improve Outcome: Progressing   

## 2017-10-17 NOTE — Plan of Care (Signed)
  Problem: Clinical Measurements: Goal: Ability to maintain clinical measurements within normal limits will improve Outcome: Progressing   

## 2017-10-17 NOTE — Progress Notes (Signed)
Inpatient Diabetes Program Recommendations  AACE/ADA: New Consensus Statement on Inpatient Glycemic Control (2015)  Target Ranges:  Prepandial:   less than 140 mg/dL      Peak postprandial:   less than 180 mg/dL (1-2 hours)      Critically ill patients:  140 - 180 mg/dL   Results for [name], [name] (MRN [id number]) as of 10/17/2017 09:42  Ref. Range 10/16/2017 06:15 10/16/2017 11:15 10/16/2017 16:10 10/16/2017 21:30 10/17/2017 06:21  Glucose-Capillary Latest Ref Range: 65 - 99 mg/dL 276 (H) Novolog 11 units given 197 (H) Novolog 4 units given 404 (H) Novolog 25 units given 438 (H) Novolog 10 units given 273 (H) Novolog 16 units given   Review of Glycemic Control  Diabetes history: DM 2 Outpatient Diabetes medications: Metformin 500 mg BID Current orders for Inpatient glycemic control:  Lantus 25 units Daily Novolog 0-20 units tid Novolog 0-5 units qhs Novolog 5 units tid meal coverage  Inpatient Diabetes Program Recommendations:    If steroids remain at same dose,  Consider increasing Lantus to 32 units Consider increasing meal coverage to Novolog 8 units  Thanks,  Tama Headings RN, MSN, BC-ADM, [facility] Inpatient Diabetes Coordinator Team Pager [phone] (8a-5p)

## 2017-10-17 NOTE — Progress Notes (Signed)
[facility]                                                                              Patient Demographics  [name], is a 67 y.o. female, DOB - 09/12/1950, DVV:[id number]  Admit date - 10/05/2017   Admitting Physician Toy Baker, MD  Outpatient Primary MD for the patient is [name], MD  Outpatient specialists:   LOS - 11  days   Medical records reviewed and are as summarized below:    Chief Complaint  Patient presents with  . Asthma       Brief summary   67 year old female with severe asthma, type 2 diabetes mellitus, hypertension, hyperlipidemia, history of PE, depression, CAD status post stent following MI in 12/2015, seizure disorder with occasional episodes of confusion, chronic diastolic CHF presented with URI symptoms associated with cough, chest tightness and dyspnea.  She was using nebulizers and prednisone at home without improvement.  Patient admitted for acute asthma exacerbation with slow improvement.  PCCM consulted for further management.  [facility] also consulted for laryngopharyngeal reflux.  Assessment & Plan    Principal Problem:   Asthma exacerbation -Wheezing slightly better today, continue IV Solu-Medrol 60 mg every 12 hours  - Continue duo nebs, Pulmicort, Brovana, Singulair, Flonase.  - Completed azithromycin.  Respiratory virus panel negative, sputum showed normal flora -Peak flow <200 this a.m.   Active Problems: Acute respiratory failure with hypoxia -O2 sats are improving, 98% on room air     Essential hypertension -Continue Cardizem, Bystolic, hydralazine, torsemide    GERD with laryngeal pharyngeal reflux Seen by GI, [facility], continue Pepcid, Maalox, Nexium Seen by SLP, had MBS, recommended regular diet with thin liquids. Continue Reglan  Acute on chronic diastolic CHF Received IV Lasix during hospital stay, currently on torsemide, euvolemic -Negative balance of 9.7 L  History of seizures -Continue  Depakote  Uncontrolled type 2 diabetes mellitus, with hyperglycemia  -CBGs worse due to steroids -  increase Lantus to 32 units, Increase NovoLog meal coverage to 8 units 3 times daily AC, continue sliding scale insulin   Code Status: Full code DVT Prophylaxis:  Lovenox  Family Communication: Discussed in detail with the patient, all imaging results, lab results explained to the patient    Disposition Plan:  DC in 1 or 2 days once wheezing is improved   Time Spent in minutes 25 minutes  Procedures:  None  Consultants:   GI Pulmonary  Antimicrobials:      Medications  Scheduled Meds: . arformoterol  15 mcg Nebulization BID  . aspirin EC  81 mg Oral Daily  . atorvastatin  40 mg Oral Daily  . budesonide (PULMICORT) nebulizer solution  0.5 mg Nebulization BID  . diltiazem  240 mg Oral Daily  . divalproex  1,000 mg Oral QHS  . docusate sodium  100 mg Oral BID  . enoxaparin (LOVENOX) injection  40 mg Subcutaneous Q24H  . esomeprazole  40 mg Oral BID AC  . famotidine  10 mg Oral Daily  . feeding supplement (GLUCERNA SHAKE)  237 mL Oral BID BM  . ferrous sulfate  325 mg Oral BID  WC  . fluticasone  2 spray Each Nare Daily  . guaiFENesin  600 mg Oral BID  . hydrALAZINE  25 mg Oral BID  . insulin aspart  0-20 Units Subcutaneous TID WC  . insulin aspart  0-5 Units Subcutaneous QHS  . insulin aspart  8 Units Subcutaneous TID WC  . [START ON 10/18/2017] insulin glargine  32 Units Subcutaneous Daily  . insulin glargine  5 Units Subcutaneous Once  . ipratropium-albuterol  3 mL Nebulization TID  . loratadine  10 mg Oral Daily  . methylPREDNISolone (SOLU-MEDROL) injection  60 mg Intravenous Q12H  . metoCLOPramide  5 mg Oral TID AC & HS  . montelukast  10 mg Oral Daily  . nebivolol  5 mg Oral Daily  . polyethylene glycol  17 g Oral Daily  . torsemide  20 mg Oral Daily   Continuous Infusions: PRN Meds:.acetaminophen **OR** acetaminophen, ALPRAZolam, alum & mag hydroxide-simeth,  docusate sodium, HYDROcodone-acetaminophen, ipratropium-albuterol, ondansetron **OR** ondansetron (ZOFRAN) IV   Antibiotics   Anti-infectives (From admission, onward)   Start     Dose/Rate Route Frequency Ordered Stop   10/06/17 1000  azithromycin (ZITHROMAX) tablet 500 mg     500 mg Oral Daily 10/06/17 0349 10/08/17 0919        Subjective:   [name] was seen and examined today.  Wheezing is slightly better although peak flow not much improving. No chest pain. Patient denies dizziness, abdominal pain, N/V/D/C, new weakness, numbess, tingling. No acute events overnight.    Objective:   Vitals:   10/17/17 0518 10/17/17 0647 10/17/17 0756 10/17/17 0900  BP:  (!) 185/91    Pulse:  80  94  Resp:  15  18  Temp:  98.8 F (37.1 C)    TempSrc:  Oral    SpO2: 100% 100% 98% 98%  Weight:      Height:        Intake/Output Summary (Last 24 hours) at 10/17/2017 1100 Last data filed at 10/17/2017 0650 Gross per 24 hour  Intake 720 ml  Output 2550 ml  Net -1830 ml     Wt Readings from Last 3 Encounters:  10/17/17 76.6 kg (168 lb 14.4 oz)  09/18/17 72.6 kg (160 lb)  09/17/17 73.4 kg (161 lb 12.8 oz)     Exam   General: Alert and oriented x 3, NAD  Eyes:   HEENT:  Atraumatic, normocephalic  Cardiovascular: S1 S2 auscultated,  RRR. No pedal edema b/l  Respiratory: Bilateral expiratory wheezing  Gastrointestinal: Soft, nontender, nondistended, + bowel sounds  Ext: no pedal edema bilaterally  Neuro: no new deficit  Musculoskeletal: No digital cyanosis, clubbing  Skin: No rashes  Psych: Normal affect and demeanor, alert and oriented x3    Data Reviewed:  I have personally reviewed following labs and imaging studies  Micro Results Recent Results (from the past 240 hour(s))  Respiratory Panel by PCR     Status: None   Collection Time: 10/07/17  1:18 PM  Result Value Ref Range Status   Adenovirus NOT DETECTED NOT DETECTED Final   Coronavirus 229E NOT  DETECTED NOT DETECTED Final   Coronavirus HKU1 NOT DETECTED NOT DETECTED Final   Coronavirus NL63 NOT DETECTED NOT DETECTED Final   Coronavirus OC43 NOT DETECTED NOT DETECTED Final   Metapneumovirus NOT DETECTED NOT DETECTED Final   Rhinovirus / Enterovirus NOT DETECTED NOT DETECTED Final   Influenza A NOT DETECTED NOT DETECTED Final   Influenza B NOT DETECTED NOT DETECTED Final  Parainfluenza Virus 1 NOT DETECTED NOT DETECTED Final   Parainfluenza Virus 2 NOT DETECTED NOT DETECTED Final   Parainfluenza Virus 3 NOT DETECTED NOT DETECTED Final   Parainfluenza Virus 4 NOT DETECTED NOT DETECTED Final   Respiratory Syncytial Virus NOT DETECTED NOT DETECTED Final   Bordetella pertussis NOT DETECTED NOT DETECTED Final   Chlamydophila pneumoniae NOT DETECTED NOT DETECTED Final   Mycoplasma pneumoniae NOT DETECTED NOT DETECTED Final    Comment: Performed at [facility], [place] [address] [place], [place] [address]  Culture, expectorated sputum-assessment     Status: None   Collection Time: 10/08/17 12:15 PM  Result Value Ref Range Status   Specimen Description SPUTUM  Final   Special Requests NONE  Final   Sputum evaluation   Final    THIS SPECIMEN IS ACCEPTABLE FOR SPUTUM CULTURE Performed at [facility], [place] [address], [place], [place] [address]    Report Status 10/08/2017 FINAL  Final  Culture, respiratory (NON-Expectorated)     Status: None   Collection Time: 10/08/17 12:15 PM  Result Value Ref Range Status   Specimen Description SPUTUM  Final   Special Requests NONE Reflexed from [id number]  Final   Gram Stain   Final    RARE WBC PRESENT, PREDOMINANTLY PMN RARE SQUAMOUS EPITHELIAL CELLS PRESENT MODERATE GRAM POSITIVE COCCI IN PAIRS IN CHAINS IN CLUSTERS RARE GRAM VARIABLE ROD    Culture   Final    MODERATE Consistent with normal respiratory flora. Performed at [facility], [place] [address], [place], [place] [address]    Report Status 10/10/2017 FINAL  Final     Radiology Reports Dg Chest 2 View  Result Date: 10/05/2017 CLINICAL DATA:  Asthma exacerbation. Cough for 2 days. Hypertension. Ex-smoker. EXAM: CHEST - 2 VIEW COMPARISON:  08/12/2017 FINDINGS: Midline trachea. Mild cardiomegaly. Tortuous thoracic aorta. No pleural effusion or pneumothorax. Clear lungs. IMPRESSION: Mild cardiomegaly, without acute disease. Electronically Signed   By: Abigail Miyamoto M.D.   On: 10/05/2017 15:59   Dg Esophagus  Result Date: 10/09/2017 CLINICAL DATA:  Shortness of breath, bronchitis, wheezing, asthma exacerbation. Heartburn. Occasionally feels that solids are slow to go down. EXAM: ESOPHOGRAM / BARIUM SWALLOW / BARIUM TABLET STUDY TECHNIQUE: Combined double contrast and single contrast examination performed using effervescent crystals, thick barium liquid, and thin barium liquid. The patient was observed with fluoroscopy swallowing a 13 mm barium sulphate tablet. FLUOROSCOPY TIME:  Fluoroscopy Time:  1 minutes 18 seconds Radiation Exposure Index (if provided by the fluoroscopic device): Number of Acquired Spot Images: 0 COMPARISON:  None. FINDINGS: Laryngeal penetration with swallowing, no aspiration. Esophageal motility is within normal limits. No esophageal fold thickening, stricture or obstruction. A 13 mm barium tablet passed into the stomach without difficulty. IMPRESSION: 1. Low range of penetration without aspiration. Otherwise, normal exam. Electronically Signed   By: Lorin Picket M.D.   On: 10/09/2017 14:20   Dg Chest Port 1 View  Result Date: 10/09/2017 CLINICAL DATA:  Shortness of breath.  Wheezing this morning. EXAM: PORTABLE CHEST 1 VIEW COMPARISON:  Frontal and lateral views 4 days ago 10/05/2017. Additional priors FINDINGS: Lower lung volumes from prior exam leading to bronchovascular crowding. Similar mild cardiomegaly and unchanged mediastinal contours. Mild left greater than right basilar atelectasis. No confluent airspace disease. No large pleural  effusion or pneumothorax. Unchanged osseous structures. IMPRESSION: Hypoventilatory chest. Similar cardiomegaly and bibasilar atelectasis allowing for differences in technique. Electronically Signed   By: Fonnie Birkenhead.D.  On: 10/09/2017 06:00    Lab Data:  CBC: No results for input(s): WBC, NEUTROABS, HGB, HCT, MCV, PLT in the last 168 hours. Basic Metabolic Panel: Recent Labs  Lab 10/16/17 0311  NA 135  K 4.4  CL 96*  CO2 31  GLUCOSE 256*  BUN 27*  CREATININE 1.13*  CALCIUM 9.4   GFR: Estimated Creatinine Clearance: 45.7 mL/min (A) (by C-G formula based on SCr of 1.13 mg/dL (H)). Liver Function Tests: No results for input(s): AST, ALT, ALKPHOS, BILITOT, PROT, ALBUMIN in the last 168 hours. No results for input(s): LIPASE, AMYLASE in the last 168 hours. No results for input(s): AMMONIA in the last 168 hours. Coagulation Profile: No results for input(s): INR, PROTIME in the last 168 hours. Cardiac Enzymes: No results for input(s): CKTOTAL, CKMB, CKMBINDEX, TROPONINI in the last 168 hours. BNP (last 3 results) No results for input(s): PROBNP in the last 8760 hours. HbA1C: No results for input(s): HGBA1C in the last 72 hours. CBG: Recent Labs  Lab 10/16/17 0615 10/16/17 1115 10/16/17 1610 10/16/17 2130 10/17/17 0621  GLUCAP 276* 197* 404* 438* 273*   Lipid Profile: No results for input(s): CHOL, HDL, LDLCALC, TRIG, CHOLHDL, LDLDIRECT in the last 72 hours. Thyroid Function Tests: No results for input(s): TSH, T4TOTAL, FREET4, T3FREE, THYROIDAB in the last 72 hours. Anemia Panel: No results for input(s): VITAMINB12, FOLATE, FERRITIN, TIBC, IRON, RETICCTPCT in the last 72 hours. Urine analysis:    Component Value Date/Time   COLORURINE YELLOW 08/07/2014 [place] 08/07/2014 1304   LABSPEC 1.012 08/07/2014 1304   PHURINE 5.0 08/07/2014 1304   GLUCOSEU NEGATIVE 08/07/2014 1304   HGBUR NEGATIVE 08/07/2014 1304   BILIRUBINUR NEGATIVE 08/07/2014  1304   KETONESUR NEGATIVE 08/07/2014 1304   PROTEINUR NEGATIVE 08/07/2014 1304   UROBILINOGEN 0.2 08/07/2014 1304   NITRITE NEGATIVE 08/07/2014 1304   LEUKOCYTESUR NEGATIVE 08/07/2014 1304     [name] M.D. [facility] 10/17/2017, 11:00 AM  Pager: [phone] Between 7am to 7pm - call Pager - [phone]  After 7pm go to [email or web address] - password [email or web address]  Call night coverage person covering after 7pm

## 2017-10-17 NOTE — Progress Notes (Signed)
Nutrition Follow Up  DOCUMENTATION CODES:   Obesity unspecified  INTERVENTION:    Glucerna Shake po BID, each supplement provides 220 kcal and 10 grams of protein  NUTRITION DIAGNOSIS:   Increased nutrient needs related to chronic illness as evidenced by estimated needs, ongoing  GOAL:   Patient will meet greater than or equal to 90% of their needs, met  MONITOR:   PO intake, Supplement acceptance, Labs, Weight trends, I & O's  ASSESSMENT:   67 y.o. female with medical history significant of asthma type 2 diabetes, hypertension, hyperlipidemia, pulmonary emboli and, depression, , coronary artery disease, stent placement following a heart attack in January 18 2016, seizure disorder with occasional episodes of confusion, chronic diastolic CHF, HLD, GERD, HTN. Pt admitted for acute bronchitis and asthma exacerbation    Pt s/p bedside swallow evaluation 6/13. SLP recommending Regular, thin liquid diet. PO intake good at 100% per flowsheet records.  Receiving Glucerna Shake supplements BID. Labs and medications reviewed. CBG's [phone].  Diet Order:   Diet Order           Diet Carb Modified Fluid consistency: Thin; Room service appropriate? Yes  Diet effective now         EDUCATION NEEDS:   Education needs have been addressed  Skin:  Skin Assessment: Reviewed RN Assessment  Last BM:  6/16  Height:   Ht Readings from Last 1 Encounters:  10/17/17 _0  (1.575 m)    Weight:   Wt Readings from Last 1 Encounters:  10/17/17 168 lb 14.4 oz (76.6 kg)    Ideal Body Weight:  50 kg  BMI:  Body mass index is 30.89 kg/m.  Estimated Nutritional Needs:   Kcal:  1500-1700  Protein:  75-90 gm  Fluid:  1.5-1.7 L  Arthur Holms, RD, LDN Pager #: [phone] After-Hours Pager #: [phone]

## 2017-10-18 LAB — GLUCOSE, CAPILLARY: Glucose-Capillary: 239 mg/dL — ABNORMAL HIGH (ref 65–99)

## 2017-10-18 MED ORDER — METOCLOPRAMIDE HCL 5 MG PO TABS: 5.0000 mg | ORAL_TABLET | Freq: Three times a day (TID) | ORAL | 3 refills | Status: DC

## 2017-10-18 MED ORDER — MONTELUKAST SODIUM 10 MG PO TABS: 10.0000 mg | ORAL_TABLET | Freq: Every day | ORAL | 3 refills | Status: DC

## 2017-10-18 MED ORDER — ALBUTEROL SULFATE (2.5 MG/3ML) 0.083% IN NEBU: 2.5000 mg | INHALATION_SOLUTION | Freq: Three times a day (TID) | RESPIRATORY_TRACT | 1 refills | Status: DC

## 2017-10-18 MED ORDER — IRBESARTAN 300 MG PO TABS: 300.0000 mg | ORAL_TABLET | Freq: Every day | ORAL | Status: DC

## 2017-10-18 MED ORDER — FAMOTIDINE 10 MG PO TABS: 10.0000 mg | ORAL_TABLET | Freq: Every day | ORAL | 3 refills | Status: DC

## 2017-10-18 MED ORDER — PREDNISONE 10 MG PO TABS: ORAL_TABLET | ORAL | 0 refills | Status: DC

## 2017-10-18 MED ORDER — ARFORMOTEROL TARTRATE 15 MCG/2ML IN NEBU: 15.0000 ug | INHALATION_SOLUTION | Freq: Two times a day (BID) | RESPIRATORY_TRACT | 3 refills | Status: DC

## 2017-10-18 MED ORDER — GLIMEPIRIDE 2 MG PO TABS: 2.0000 mg | ORAL_TABLET | ORAL | 0 refills | Status: DC

## 2017-10-18 MED ORDER — GUAIFENESIN-CODEINE 100-10 MG/5ML PO SOLN
5.0000 mL | Freq: Three times a day (TID) | ORAL | 0 refills | Status: DC | PRN
Start: 2017-10-18 — End: 2017-11-30

## 2017-10-18 MED ORDER — FLUTICASONE PROPIONATE 50 MCG/ACT NA SUSP: 2.0000 | Freq: Every day | NASAL | 2 refills | Status: DC

## 2017-10-18 MED ORDER — ESOMEPRAZOLE MAGNESIUM 40 MG PO CPDR: 40.0000 mg | DELAYED_RELEASE_CAPSULE | Freq: Two times a day (BID) | ORAL | 3 refills | Status: DC

## 2017-10-18 MED ORDER — FERROUS SULFATE 325 (65 FE) MG PO TABS: 325.0000 mg | ORAL_TABLET | Freq: Two times a day (BID) | ORAL | 3 refills | Status: DC

## 2017-10-18 MED ORDER — METFORMIN HCL 1000 MG PO TABS: 1000.0000 mg | ORAL_TABLET | Freq: Two times a day (BID) | ORAL | 3 refills | Status: DC

## 2017-10-18 MED ORDER — BUDESONIDE 0.5 MG/2ML IN SUSP: 0.5000 mg | Freq: Two times a day (BID) | RESPIRATORY_TRACT | 12 refills | Status: DC

## 2017-10-18 NOTE — Discharge Summary (Signed)
Physician Discharge Summary   Patient ID: [name] MRN: [id number] DOB/AGE: [birth year] 67 y.o.  Admit date: 10/05/2017 Discharge date: 10/18/2017  Primary Care Physician:  [name], MD   Recommendations for Outpatient Follow-up:  1. Follow up with PCP in 1-2 weeks   Home Health: None Equipment/Devices: None  Discharge Condition: stable CODE STATUS: FULL  Diet recommendation: Carb modified diet   Discharge Diagnoses:    . Severe persistent asthma with acute exacerbation Acute hypoxic respiratory failure  GERD with laryngeal pharyngeal reflux . Macrocytic anemia . Asthma exacerbation . Essential hypertension . Acute on chronic diastolic CHF (congestive heart failure) ([place]) . Depression with anxiety . History of seizures . Diabetes mellitus type 2, uncontrolled with hyperglycemia   Consults: Pulmonology [facility]   Allergies:   Allergies  Allergen Reactions  . Amlodipine Besylate Other (See Comments)    Reaction unknown  . Lisinopril Other (See Comments)    Reaction unknown  . Pantoprazole Sodium Other (See Comments)    Reaction unknown     DISCHARGE MEDICATIONS: Allergies as of 10/18/2017      Reactions   Amlodipine Besylate Other (See Comments)   Reaction unknown   Lisinopril Other (See Comments)   Reaction unknown   Pantoprazole Sodium Other (See Comments)   Reaction unknown      Medication List    STOP taking these medications   budesonide-formoterol 80-4.5 MCG/ACT inhaler Commonly known as:  SYMBICORT   ranitidine 150 MG capsule Commonly known as:  ZANTAC Replaced by:  famotidine 10 MG tablet     TAKE these medications   albuterol 108 (90 Base) MCG/ACT inhaler Commonly known as:  PROAIR HFA Inhale 2 puffs into the lungs every 4 (four) hours as needed for wheezing or shortness of breath. What changed:  Another medication with the same name was changed. Make sure you understand how and when to take each.    albuterol (2.5 MG/3ML) 0.083% nebulizer solution Commonly known as:  PROVENTIL Take 3 mLs (2.5 mg total) by nebulization 3 (three) times daily. And every 4 hours as needed What changed:    when to take this  reasons to take this  additional instructions   ALPRAZolam 0.5 MG tablet Commonly known as:  XANAX Take 1 tablet (0.5 mg total) by mouth 2 (two) times daily as needed for anxiety.   arformoterol 15 MCG/2ML Nebu Commonly known as:  BROVANA Take 2 mLs (15 mcg total) by nebulization 2 (two) times daily.   aspirin EC 81 MG tablet Take 81 mg by mouth daily.   atorvastatin 40 MG tablet Commonly known as:  LIPITOR Take 40 mg by mouth daily.   Biotin 5000 MCG Caps Take 1 capsule by mouth daily.   budesonide 0.5 MG/2ML nebulizer solution Commonly known as:  PULMICORT Take 2 mLs (0.5 mg total) by nebulization 2 (two) times daily.   diltiazem 240 MG 24 hr capsule Commonly known as:  CARDIZEM CD Take 1 capsule (240 mg total) by mouth daily.   divalproex 500 MG 24 hr tablet Commonly known as:  DEPAKOTE ER Take 2 tablets (1,000 mg total) by mouth at bedtime.   esomeprazole 40 MG capsule Commonly known as:  NEXIUM Take 1 capsule (40 mg total) by mouth 2 (two) times daily before a meal. What changed:  additional instructions   estradiol 1 MG tablet Commonly known as:  ESTRACE Take 1 mg by mouth daily.   famotidine 10 MG tablet Commonly known as:  PEPCID Take 1 tablet (  10 mg total) by mouth daily. Start taking on:  10/19/2017 Replaces:  ranitidine 150 MG capsule   ferrous sulfate 325 (65 FE) MG tablet Take 1 tablet (325 mg total) by mouth 2 (two) times daily with a meal.   fluticasone 50 MCG/ACT nasal spray Commonly known as:  FLONASE Place 2 sprays into both nostrils daily. Start taking on:  10/19/2017   glimepiride 2 MG tablet Commonly known as:  AMARYL Take 1 tablet (2 mg total) by mouth every morning. While on steroids and CBG's running over 170's    guaiFENesin-codeine 100-10 MG/5ML syrup Take 5 mLs by mouth 3 (three) times daily as needed for cough.   hydrALAZINE 25 MG tablet Commonly known as:  APRESOLINE Take 1 tablet (25 mg total) by mouth 2 (two) times daily.   irbesartan 300 MG tablet Commonly known as:  AVAPRO Take 1 tablet (300 mg total) by mouth daily. Hold until follow-up with Dr Melvyn Novas What changed:  additional instructions   loratadine 10 MG tablet Commonly known as:  CLARITIN Take 1 tablet (10 mg total) by mouth daily.   metFORMIN 1000 MG tablet Commonly known as:  GLUCOPHAGE Take 1 tablet (1,000 mg total) by mouth 2 (two) times daily with a meal. What changed:  medication strength   metoCLOPramide 5 MG tablet Commonly known as:  REGLAN Take 1 tablet (5 mg total) by mouth 4 (four) times daily -  before meals and at bedtime.   montelukast 10 MG tablet Commonly known as:  SINGULAIR Take 1 tablet (10 mg total) by mouth daily. Start taking on:  10/19/2017   nebivolol 5 MG tablet Commonly known as:  BYSTOLIC Take 5 mg by mouth daily.   nitroGLYCERIN 0.4 MG SL tablet Commonly known as:  NITROSTAT Place 1 tablet (0.4 mg total) under the tongue every 5 (five) minutes as needed for chest pain. x3 as needed   potassium chloride SA 20 MEQ tablet Commonly known as:  K-DUR,KLOR-CON Take 20 mEq by mouth daily.   predniSONE 10 MG tablet Commonly known as:  DELTASONE Prednisone dosing: Take  Prednisone 40mg  (4 tabs) x 3 days, then taper to 30mg  (3 tabs) x 3 days, then 20mg  (2 tabs) x 3days, then 10mg  (1 tab) x 3days, then OFF.  Dispense:  30 tabs, refills: None What changed:  additional instructions   torsemide 20 MG tablet Commonly known as:  DEMADEX Take 20-40 mg by mouth daily. May take an extra tablet if needed.        Brief H and P: For complete details please refer to admission H and P, but in brief10 year old female with severe asthma, type 2 diabetes mellitus, hypertension, hyperlipidemia, history of  PE, depression, CAD status post stent following MI in 12/2015, seizure disorder with occasional episodes of confusion, chronic diastolic CHF presented with URI symptoms associated with cough, chest tightness and dyspnea. She was using nebulizers and prednisone at home without improvement. Patient admitted for acute asthma exacerbation with slow improvement. PCCM was consulted for further management. GI also consulted for laryngopharyngeal reflux.   Hospital Course:  Severe persistent asthma with acute exacerbation worsened in the setting of reflux -Wheezing much improved, IV solumedrol tapered, she will start oral outpatient prednisone taper - Continue albuterol nebs 3 times a day and every 4 hours as needed, Pulmicort twice a day, Brovana BID, Singulair, Flonase.  - Completed azithromycin.  Respiratory virus panel negative, sputum showed normal flora -Patient was recommended to hold ARB and Symbicort until follow-up with Dr.  Wert   Acute respiratory failure with hypoxia -O2 sats improved, 98% on room air    Essential hypertension -Continue Cardizem, Bystolic, hydralazine, torsemide    GERD with laryngeal pharyngeal reflux Seen by GI, [facility], continue Pepcid, Maalox, Nexium Seen by [facility], had MBS, recommended regular diet with thin liquids. Continue Reglan  Acute on chronic diastolic CHF Received IV Lasix during hospital stay, continue torsemide outpatient, euvolemic.   negative balance of 9.7 L  History of seizures -Continue Depakote  Uncontrolled type 2 diabetes mellitus, with hyperglycemia  -CBGs worse due to steroids Hemoglobin A1c 7.3. Metformin increased to 1000 mg twice a day, added Amaryl 2 mg daily while patient is on prednisone.  Recommended strongly to check her CBGs fasting and before supper and maintain a log. While inpatient, patient received Lantus insulin and sliding scale    Day of Discharge S: Feels a lot better today, ambulating in the hallways without  any difficulty, no shortness of breath.  No wheezing this morning.  BP (!) 182/83   Pulse 92   Temp 98.8 F (37.1 C) (Oral)   Resp 18   Ht 5\' 2"  (1.575 m)   Wt 76.5 kg (168 lb 9.6 oz)   LMP  (LMP Unknown)   SpO2 98%   BMI 30.84 kg/m   Physical Exam: General: Alert and awake oriented x3 not in any acute distress. HEENT: anicteric sclera, pupils reactive to light and accommodation CVS: S1-S2 clear no murmur rubs or gallops Chest: clear to auscultation bilaterally, no wheezing rales or rhonchi Abdomen: soft nontender, nondistended, normal bowel sounds Extremities: no cyanosis, clubbing or edema noted bilaterally Neuro: Cranial nerves II-XII intact, no focal neurological deficits   The results of significant diagnostics from this hospitalization (including imaging, microbiology, ancillary and laboratory) are listed below for reference.      Procedures/Studies:  Dg Chest 2 View  Result Date: 10/05/2017 CLINICAL DATA:  Asthma exacerbation. Cough for 2 days. Hypertension. Ex-smoker. EXAM: CHEST - 2 VIEW COMPARISON:  08/12/2017 FINDINGS: Midline trachea. Mild cardiomegaly. Tortuous thoracic aorta. No pleural effusion or pneumothorax. Clear lungs. IMPRESSION: Mild cardiomegaly, without acute disease. Electronically Signed   By: Abigail Miyamoto M.D.   On: 10/05/2017 15:59   Dg Esophagus  Result Date: 10/09/2017 CLINICAL DATA:  Shortness of breath, bronchitis, wheezing, asthma exacerbation. Heartburn. Occasionally feels that solids are slow to go down. EXAM: ESOPHOGRAM / BARIUM SWALLOW / BARIUM TABLET STUDY TECHNIQUE: Combined double contrast and single contrast examination performed using effervescent crystals, thick barium liquid, and thin barium liquid. The patient was observed with fluoroscopy swallowing a 13 mm barium sulphate tablet. FLUOROSCOPY TIME:  Fluoroscopy Time:  1 minutes 18 seconds Radiation Exposure Index (if provided by the fluoroscopic device): Number of Acquired Spot Images:  0 COMPARISON:  None. FINDINGS: Laryngeal penetration with swallowing, no aspiration. Esophageal motility is within normal limits. No esophageal fold thickening, stricture or obstruction. A 13 mm barium tablet passed into the stomach without difficulty. IMPRESSION: 1. Low range of penetration without aspiration. Otherwise, normal exam. Electronically Signed   By: Lorin Picket M.D.   On: 10/09/2017 14:20   Dg Chest Port 1 View  Result Date: 10/09/2017 CLINICAL DATA:  Shortness of breath.  Wheezing this morning. EXAM: PORTABLE CHEST 1 VIEW COMPARISON:  Frontal and lateral views 4 days ago 10/05/2017. Additional priors FINDINGS: Lower lung volumes from prior exam leading to bronchovascular crowding. Similar mild cardiomegaly and unchanged mediastinal contours. Mild left greater than right basilar atelectasis. No confluent airspace disease. No  large pleural effusion or pneumothorax. Unchanged osseous structures. IMPRESSION: Hypoventilatory chest. Similar cardiomegaly and bibasilar atelectasis allowing for differences in technique. Electronically Signed   By: Jeb Levering M.D.   On: 10/09/2017 06:00       LAB RESULTS: Basic Metabolic Panel: Recent Labs  Lab 10/16/17 0311  NA 135  K 4.4  CL 96*  CO2 31  GLUCOSE 256*  BUN 27*  CREATININE 1.13*  CALCIUM 9.4   Liver Function Tests: No results for input(s): AST, ALT, ALKPHOS, BILITOT, PROT, ALBUMIN in the last 168 hours. No results for input(s): LIPASE, AMYLASE in the last 168 hours. No results for input(s): AMMONIA in the last 168 hours. CBC: No results for input(s): WBC, NEUTROABS, HGB, HCT, MCV, PLT in the last 168 hours. Cardiac Enzymes: No results for input(s): CKTOTAL, CKMB, CKMBINDEX, TROPONINI in the last 168 hours. BNP: Invalid input(s): POCBNP CBG: Recent Labs  Lab 10/17/17 2113 10/18/17 0601  GLUCAP 169* 239*      Disposition and Follow-up: Discharge Instructions    Diet Carb Modified   Complete by:  As directed     Discharge instructions   Complete by:  As directed    It is VERY IMPORTANT that you follow up with a PCP on a regular basis.  Check your blood glucoses before breakfast and supper everyday especially when your are on prednisone. Maintain a log of your readings.  Bring this log with you when you follow up with your PCP so that he or she can adjust yourmedications at your follow up visit. Please take amaryl while you are on prednisone and sugars are running consistently high over >180's in the morning and you have been taking metformin.   Please continue albuterol nebs three times daily until you follow up with Dr Melvyn Novas and then per his instructions.   Increase activity slowly   Complete by:  As directed        DISPOSITION: Home   DISCHARGE FOLLOW-UP Follow-up Information    Tanda Rockers, MD. Schedule an appointment as soon as possible for a visit in 1 week(s).   Specialty:  Pulmonary Disease Why:  next week Contact information: [address] [place] [address] [phone]        [name], MD. Schedule an appointment as soon as possible for a visit in 2 week(s).   Specialty:  Family Medicine Contact information: [place] [address] [phone]        Jerline Pain, MD .   Specialty:  Cardiology Contact information: [phone] [address] [place] [address] [phone]            Time coordinating discharge:  40-minutes  Signed:   Estill Cotta M.D. [facility] 10/18/2017, 11:23 AM Pager: [phone]

## 2017-10-18 NOTE — Progress Notes (Signed)
Patient with discharge orders to home. Discharge instructions with prescriptions x13 given to the patient with verbal understanding received. Personal belongings packed by family. IV removed. Catheter intact. Patient very appreciative of care. Called for assistance to front lobby.

## 2017-10-18 NOTE — Progress Notes (Signed)
Inpatient Diabetes Program Recommendations  AACE/ADA: New Consensus Statement on Inpatient Glycemic Control (2019)  Target Ranges:  Prepandial:   less than 140 mg/dL      Peak postprandial:   less than 180 mg/dL (1-2 hours)      Critically ill patients:  140 - 180 mg/dL   Results for [name], [name] (MRN [id number]) as of 10/18/2017 07:55  Ref. Range 10/17/2017 06:21 10/17/2017 11:50 10/17/2017 17:02 10/17/2017 21:13 10/18/2017 06:01  Glucose-Capillary Latest Ref Range: 65 - 99 mg/dL 273 (H) 327 (H) 333 (H) 169 (H) 239 (H)  Results for [name], [name] (MRN [id number]) as of 10/18/2017 07:55  Ref. Range 10/06/2017 02:53  Hemoglobin A1C Latest Ref Range: 4.8 - 5.6 % 7.3 (H)   Review of Glycemic Control  Diabetes history: DM2 Outpatient Diabetes medications: Metformin 500 mg BID Current orders for Inpatient glycemic control: Lantus 32 units daily, Novolog 8 units TID with meals for meal coverage, Novolog 0-20 units TID with meals, Novolog 0-5 units QHs; Solumedrol 60 mg Q12H  Inpatient Diabetes Program Recommendations: Insulin-Basal: Noted Lantus was increased to 32 units daily on 6/19. Insulin - Meal Coverage: If steroids are continued as ordered, please consider increasing meal coverage to Novolog 12 units TID with meals.  Thanks, Barnie Alderman, RN, MSN, CDE Diabetes Coordinator Inpatient Diabetes Program [phone] (Team Pager from 8am to 5pm)

## 2017-10-19 ENCOUNTER — Telehealth: Payer: Self-pay | Admitting: Cardiology

## 2017-10-19 NOTE — Telephone Encounter (Signed)
Left message for patient to c/b to rschedule her appt.  Dr Marlou Porch has availability 8/2 or she can be seen by PA/NP.

## 2017-10-19 NOTE — Telephone Encounter (Signed)
New message:      Pt states she was just recently discharged from the hosp and needs an appt sooner than Sept to see the doctor.

## 2017-10-22 NOTE — Telephone Encounter (Signed)
Spoke with patient and moved appt up to 8/2 with Dr Marlou Porch.

## 2017-10-22 NOTE — Telephone Encounter (Signed)
Pt calling back and states she can't go back to work until she see's [facility], or APP, nothing open in the next couple weeks, pt asking what to do so she can go back to work-pls call [phone]

## 2017-10-23 ENCOUNTER — Ambulatory Visit: Payer: Medicare Other | Admitting: Adult Health

## 2017-10-23 ENCOUNTER — Encounter: Payer: Self-pay | Admitting: Adult Health

## 2017-10-23 DIAGNOSIS — K219 Gastro-esophageal reflux disease without esophagitis: Secondary | ICD-10-CM

## 2017-10-23 DIAGNOSIS — I5032 Chronic diastolic (congestive) heart failure: Secondary | ICD-10-CM | POA: Diagnosis not present

## 2017-10-23 DIAGNOSIS — J4551 Severe persistent asthma with (acute) exacerbation: Secondary | ICD-10-CM | POA: Diagnosis not present

## 2017-10-23 NOTE — Assessment & Plan Note (Signed)
Recurrent exacerbation questionable etiology possibly secondary to uncontrolled GERD Once patient has tapered off steroids will check a CBC with differential looking eosinophils and IgE. Continue on Brovana budesonide and Singulair.  Plan  Patient Instructions  Continue on Brovana and Budesonide Neb Twice daily  .  Continue on Singulair daily  Continue on Claritin daily.  Finish Prednisone as directed.  GERD diet .  Continue on Nexium Twice daily  Before meal  Continue on Pepcid At bedtime  .  Continue on Reglan Four times a day  .  Follow up with Dr. Melvyn Novas  In 2-3 weeks and As needed   Please contact office for sooner follow up if symptoms do not improve or worsen or seek emergency care

## 2017-10-23 NOTE — Progress Notes (Signed)
Chart and office note reviewed in detail  > agree with a/p as outlined    

## 2017-10-23 NOTE — Assessment & Plan Note (Signed)
Cont on GERD diet and tx

## 2017-10-23 NOTE — Progress Notes (Signed)
@Patient  ID: [name], female    DOB: March 15, 1951, 67 y.o.   MRN: [id number]  Chief Complaint  Patient presents with  . Follow-up    Asthma    Referring provider: [name], MD  HPI: [id number] [id number] former smoker followed for Asthma   TEST  FEN0 01/24/2016= 13 with active symptoms on symb 160 2bid >changed to dulera 100  - Spirometry 02/23/2016 NO signifiant obstruction with active symptoms  - FENO 02/23/2016= 12 On dulera 100 2bid (unable to confirm adeherence as did not bring it - Singulair 10 mg daily added 10/25/2017to dulera 100 2bid with 2 week sample only (unable to verify she used it) - FENO 04/07/2016= 43 -CTa CHest 2017  >neg PE . 4 mm RUL nodule  CT sinus 2016 neg  IgE 87 2017, positive Rast grass, dust, tree High-resolution CT chest April 2018- for ILD, stable 4 mm right upper lobe nodule. Echo 07/2017 EF 65%, Gr 2 DD , PAP 28mmHg  Spirometry 08/2017 Moderate restriction , no obstruction .   10/23/2017 Follow up : Post hospital follow up : Asthma flare  Patient presents for a post hospital follow-up.  Patient with was admitted earlier this month for a prolonged hospitalization with asthma exacerbation complicated by decompensated diastolic heart failure and acute respiratory failure.  Patient was treated with aggressive IV steroids and nebulized bronchodilators.  She was changed from Symbicort to [facility] and Pulmicort nebulizers.  Patient had acute hypoxia during her admission.  This resolved by the time of discharge.  Did not require oxygen at discharge.  Patient was felt to have significant GERD with LPR.  She was treated with Nexium and Pepcid.  She was seen by speech therapy with a modified barium swallow.  Was recommended regular diet with thin liquids.  She did require diuresis for decompensated congestive heart failure. Since discharge patient is feeling some better . Cough and wheezing are decreased  She is still have swelling in ankles esp in evening  hours.  GERD is better controlled. Now on nexium , pepcid and reglan    Allergies  Allergen Reactions  . Amlodipine Besylate Other (See Comments)    Reaction unknown  . Lisinopril Other (See Comments)    Reaction unknown  . Pantoprazole Sodium Other (See Comments)    Reaction unknown    Immunization History  Administered Date(s) Administered  . Influenza Split 03/10/2012, 03/14/2015  . Influenza, High Dose Seasonal PF 01/29/2017  . Influenza-Unspecified 01/11/2016  . Zoster Recombinat (Shingrix) 08/28/2017, 10/09/2017    Past Medical History:  Diagnosis Date  . Anxiety   . Asthma   . Bronchitis   . Chronic diastolic CHF (congestive heart failure) ([place])   . Chronic diastolic CHF (congestive heart failure) ([place]) 07/17/2016  . Coronary artery disease    a. NSTEMI 12/2015 - LHC 01/18/16: s/p overlapping DESx2 to RCA, 10% ost-prox Cx,10% mLAD.  Marland Kitchen Depression   . Diabetes mellitus   . Dyslipidemia   . GERD (gastroesophageal reflux disease)   . Heart attack ([place])   . Hypertension   . Hypertensive heart disease   . NSTEMI (non-ST elevated myocardial infarction) ([place]) 12/2015  . Pneumonia     Tobacco History: Social History   Tobacco Use  Smoking Status Former Smoker  . Packs/day: 1.00  . Years: 30.00  . Pack years: 30.00  . Types: Cigarettes  . Last attempt to quit: 01/09/2004  . Years since quitting: 13.7  Smokeless Tobacco Never Used   Counseling given: Not  Answered   Outpatient Encounter Medications as of 10/23/2017  Medication Sig  . albuterol (PROAIR HFA) 108 (90 Base) MCG/ACT inhaler Inhale 2 puffs into the lungs every 4 (four) hours as needed for wheezing or shortness of breath.  Marland Kitchen albuterol (PROVENTIL) (2.5 MG/3ML) 0.083% nebulizer solution Take 3 mLs (2.5 mg total) by nebulization 3 (three) times daily. And every 4 hours as needed  . ALPRAZolam (XANAX) 0.5 MG tablet Take 1 tablet (0.5 mg total) by mouth 2 (two) times daily as needed for anxiety.  Marland Kitchen  arformoterol (BROVANA) 15 MCG/2ML NEBU Take 2 mLs (15 mcg total) by nebulization 2 (two) times daily.  Marland Kitchen aspirin EC 81 MG tablet Take 81 mg by mouth daily.   Marland Kitchen atorvastatin (LIPITOR) 40 MG tablet Take 40 mg by mouth daily.  . Biotin 5000 MCG CAPS Take 1 capsule by mouth daily.  . budesonide (PULMICORT) 0.5 MG/2ML nebulizer solution Take 2 mLs (0.5 mg total) by nebulization 2 (two) times daily.  Marland Kitchen diltiazem (CARDIZEM CD) 240 MG 24 hr capsule Take 1 capsule (240 mg total) by mouth daily.  . divalproex (DEPAKOTE ER) 500 MG 24 hr tablet Take 2 tablets (1,000 mg total) by mouth at bedtime.  Marland Kitchen esomeprazole (NEXIUM) 40 MG capsule Take 1 capsule (40 mg total) by mouth 2 (two) times daily before a meal.  . estradiol (ESTRACE) 1 MG tablet Take 1 mg by mouth daily.   . famotidine (PEPCID) 10 MG tablet Take 1 tablet (10 mg total) by mouth daily.  . ferrous sulfate 325 (65 FE) MG tablet Take 1 tablet (325 mg total) by mouth 2 (two) times daily with a meal.  . fluticasone (FLONASE) 50 MCG/ACT nasal spray Place 2 sprays into both nostrils daily.  Marland Kitchen glimepiride (AMARYL) 2 MG tablet Take 1 tablet (2 mg total) by mouth every morning. While on steroids and CBG's running over 170's  . guaiFENesin-codeine 100-10 MG/5ML syrup Take 5 mLs by mouth 3 (three) times daily as needed for cough.  . hydrALAZINE (APRESOLINE) 25 MG tablet Take 1 tablet (25 mg total) by mouth 2 (two) times daily.  . irbesartan (AVAPRO) 300 MG tablet Take 1 tablet (300 mg total) by mouth daily. Hold until follow-up with Dr Melvyn Novas  . loratadine (CLARITIN) 10 MG tablet Take 1 tablet (10 mg total) by mouth daily.  . metFORMIN (GLUCOPHAGE) 1000 MG tablet Take 1 tablet (1,000 mg total) by mouth 2 (two) times daily with a meal.  . metoCLOPramide (REGLAN) 5 MG tablet Take 1 tablet (5 mg total) by mouth 4 (four) times daily -  before meals and at bedtime.  . montelukast (SINGULAIR) 10 MG tablet Take 1 tablet (10 mg total) by mouth daily.  . nebivolol  (BYSTOLIC) 5 MG tablet Take 5 mg by mouth daily.  . nitroGLYCERIN (NITROSTAT) 0.4 MG SL tablet Place 1 tablet (0.4 mg total) under the tongue every 5 (five) minutes as needed for chest pain. x3 as needed  . potassium chloride SA (K-DUR,KLOR-CON) 20 MEQ tablet Take 20 mEq by mouth daily.   . predniSONE (DELTASONE) 10 MG tablet Prednisone dosing: Take  Prednisone 40mg  (4 tabs) x 3 days, then taper to 30mg  (3 tabs) x 3 days, then 20mg  (2 tabs) x 3days, then 10mg  (1 tab) x 3days, then OFF.  Dispense:  30 tabs, refills: None  . torsemide (DEMADEX) 20 MG tablet Take 20-40 mg by mouth daily. May take an extra tablet if needed.   No facility-administered encounter medications on file as  of 10/23/2017.      Review of Systems  Constitutional:   No  weight loss, night sweats,  Fevers, chills, fatigue, or  lassitude.  HEENT:   No headaches,  Difficulty swallowing,  Tooth/dental problems, or  Sore throat,                No sneezing, itching, ear ache, nasal congestion, post nasal drip,   CV:  No chest pain,  Orthopnea, PND, swelling in lower extremities, anasarca, dizziness, palpitations, syncope.   GI  No heartburn, indigestion, abdominal pain, nausea, vomiting, diarrhea, change in bowel habits, loss of appetite, bloody stools.   Resp:   No chest wall deformity  Skin: no rash or lesions.  GU: no dysuria, change in color of urine, no urgency or frequency.  No flank pain, no hematuria   MS:  No joint pain or swelling.  No decreased range of motion.  No back pain.    Physical Exam  BP 128/82 (BP Location: Left Arm, Cuff Size: Normal)   Pulse 89   Ht 5\' 2"  (1.575 m)   Wt 166 lb 9.6 oz (75.6 kg)   LMP  (LMP Unknown)   SpO2 100%   BMI 30.47 kg/m   GEN: A/Ox3; pleasant , NAD, obese    HEENT:  [place]/AT,  EACs-clear, TMs-wnl, NOSE-clear, THROAT-clear, no lesions, no postnasal drip or exudate noted.   NECK:  Supple w/ fair ROM; no JVD; normal carotid impulses w/o bruits; no thyromegaly or  nodules palpated; no lymphadenopathy.    RESP  Few trace exp wheezes  no accessory muscle use, no dullness to percussion  CARD:  RRR, no m/r/g, 1+ peripheral edema, pulses intact, no cyanosis or clubbing.  GI:   Soft & nt; nml bowel sounds; no organomegaly or masses detected.   Musco: Warm bil, no deformities or joint swelling noted.   Neuro: alert, no focal deficits noted.    Skin: Warm, no lesions or rashes    Lab Results:   BNP  ProBNP  Imaging: Dg Chest 2 View  Result Date: 10/05/2017 CLINICAL DATA:  Asthma exacerbation. Cough for 2 days. Hypertension. Ex-smoker. EXAM: CHEST - 2 VIEW COMPARISON:  08/12/2017 FINDINGS: Midline trachea. Mild cardiomegaly. Tortuous thoracic aorta. No pleural effusion or pneumothorax. Clear lungs. IMPRESSION: Mild cardiomegaly, without acute disease. Electronically Signed   By: Abigail Miyamoto M.D.   On: 10/05/2017 15:59   Dg Esophagus  Result Date: 10/09/2017 CLINICAL DATA:  Shortness of breath, bronchitis, wheezing, asthma exacerbation. Heartburn. Occasionally feels that solids are slow to go down. EXAM: ESOPHOGRAM / BARIUM SWALLOW / BARIUM TABLET STUDY TECHNIQUE: Combined double contrast and single contrast examination performed using effervescent crystals, thick barium liquid, and thin barium liquid. The patient was observed with fluoroscopy swallowing a 13 mm barium sulphate tablet. FLUOROSCOPY TIME:  Fluoroscopy Time:  1 minutes 18 seconds Radiation Exposure Index (if provided by the fluoroscopic device): Number of Acquired Spot Images: 0 COMPARISON:  None. FINDINGS: Laryngeal penetration with swallowing, no aspiration. Esophageal motility is within normal limits. No esophageal fold thickening, stricture or obstruction. A 13 mm barium tablet passed into the stomach without difficulty. IMPRESSION: 1. Low range of penetration without aspiration. Otherwise, normal exam. Electronically Signed   By: Lorin Picket M.D.   On: 10/09/2017 14:20   Dg Chest  Port 1 View  Result Date: 10/09/2017 CLINICAL DATA:  Shortness of breath.  Wheezing this morning. EXAM: PORTABLE CHEST 1 VIEW COMPARISON:  Frontal and lateral views 4 days ago 10/05/2017.  Additional priors FINDINGS: Lower lung volumes from prior exam leading to bronchovascular crowding. Similar mild cardiomegaly and unchanged mediastinal contours. Mild left greater than right basilar atelectasis. No confluent airspace disease. No large pleural effusion or pneumothorax. Unchanged osseous structures. IMPRESSION: Hypoventilatory chest. Similar cardiomegaly and bibasilar atelectasis allowing for differences in technique. Electronically Signed   By: Jeb Levering M.D.   On: 10/09/2017 06:00     Assessment & Plan:   Asthma exacerbation Recurrent exacerbation questionable etiology possibly secondary to uncontrolled GERD Once patient has tapered off steroids will check a CBC with differential looking eosinophils and IgE. Continue on Brovana budesonide and Singulair.  Plan  Patient Instructions  Continue on Brovana and Budesonide Neb Twice daily  .  Continue on Singulair daily  Continue on Claritin daily.  Finish Prednisone as directed.  GERD diet .  Continue on Nexium Twice daily  Before meal  Continue on Pepcid At bedtime  .  Continue on Reglan Four times a day  .  Follow up with Dr. Melvyn Novas  In 2-3 weeks and As needed   Please contact office for sooner follow up if symptoms do not improve or worsen or seek emergency care         Chronic diastolic CHF (congestive heart failure) ([place]) Recent decompensation now improving on diuresis  Cont on torsemide.  Low salt diet   G E R D Cont on GERD diet and tx      Rexene Edison, NP 10/23/2017

## 2017-10-23 NOTE — Assessment & Plan Note (Signed)
Recent decompensation now improving on diuresis  Cont on torsemide.  Low salt diet

## 2017-10-23 NOTE — Patient Instructions (Addendum)
Continue on Brovana and Budesonide Neb Twice daily  .  Continue on Singulair daily  Continue on Claritin daily.  Finish Prednisone as directed.  GERD diet .  Continue on Nexium Twice daily  Before meal  Continue on Pepcid At bedtime  .  Continue on Reglan Four times a day  .  Follow up with Dr. Melvyn Novas  In 2-3 weeks and As needed   Please contact office for sooner follow up if symptoms do not improve or worsen or seek emergency care

## 2017-11-03 ENCOUNTER — Telehealth: Payer: Self-pay | Admitting: Physician Assistant

## 2017-11-03 NOTE — Telephone Encounter (Signed)
[name] is a 67 y.o. female with CAD s/p prior PCI and diastolic CHF. Over the last few days she has gained about 7 lbs (baseline 161 >> today 168). She takes Torsemide 20 mg QD.  She took 40 mg yesterday and 60 mg today. She notes swollen ankles, shortness of breath with minimal activity, wheezing, paroxysmal nocturnal dyspnea. She has not noted any increased urination with extra Torsemide. PLAN:  1. I recommended going to the ED, but she wants to avoid. 2. She will take an extra Torsemide 20 mg and extra K+ now 3. If she does not feel better with increased diuresis in the next 2 hours, she will need to go to the ED 4. She agrees with this plan 5. If not admitted, she will need follow up with Dr. Candee Furbish or team Monday or Tuesday. [name] Dopp, PA-C    11/03/2017 12:30 PM

## 2017-11-05 NOTE — Telephone Encounter (Signed)
Pt not much improved since she spoke with [name] Dopp, PA over the weekend. No availability on [facility]/care team or another PA/NP at [employer]. Pt scheduled to see Kerin Ransom, PA at [facility] office tomorrow.  She understands how to get to that office. Pt is agreeable to plan.

## 2017-11-06 ENCOUNTER — Ambulatory Visit: Payer: Medicare Other | Admitting: Cardiology

## 2017-11-15 ENCOUNTER — Telehealth: Payer: Self-pay | Admitting: Internal Medicine

## 2017-11-15 NOTE — Telephone Encounter (Signed)
Called and spoke with [name] from [facility] she states that she is concerned about the patient and think she needs to be seen. Patient is not using her nebulizer and she is complaining of increased SOB.   Called and spoke with patient, she states that she is doing her nebulizers but she is having increased SOB. No other complaints or symptoms present. Advised patient that we could get her in for an appointment. Patient has been scheduled to see Eustaquio Maize and will be bringing all meds. Nothing further needed.

## 2017-11-16 ENCOUNTER — Encounter: Payer: Self-pay | Admitting: Podiatry

## 2017-11-16 ENCOUNTER — Ambulatory Visit: Payer: Medicare Other

## 2017-11-16 ENCOUNTER — Ambulatory Visit (INDEPENDENT_AMBULATORY_CARE_PROVIDER_SITE_OTHER): Payer: Medicare Other | Admitting: Podiatry

## 2017-11-16 ENCOUNTER — Other Ambulatory Visit (INDEPENDENT_AMBULATORY_CARE_PROVIDER_SITE_OTHER): Payer: Medicare Other

## 2017-11-16 ENCOUNTER — Ambulatory Visit (INDEPENDENT_AMBULATORY_CARE_PROVIDER_SITE_OTHER): Payer: Medicare Other | Admitting: Adult Health

## 2017-11-16 ENCOUNTER — Encounter: Payer: Self-pay | Admitting: Adult Health

## 2017-11-16 VITALS — BP 132/80 | HR 82 | Ht 62.0 in | Wt 164.6 lb

## 2017-11-16 DIAGNOSIS — M778 Other enthesopathies, not elsewhere classified: Secondary | ICD-10-CM

## 2017-11-16 DIAGNOSIS — J4530 Mild persistent asthma, uncomplicated: Secondary | ICD-10-CM | POA: Diagnosis not present

## 2017-11-16 DIAGNOSIS — M19079 Primary osteoarthritis, unspecified ankle and foot: Secondary | ICD-10-CM

## 2017-11-16 DIAGNOSIS — R600 Localized edema: Secondary | ICD-10-CM

## 2017-11-16 DIAGNOSIS — J4551 Severe persistent asthma with (acute) exacerbation: Secondary | ICD-10-CM

## 2017-11-16 DIAGNOSIS — M779 Enthesopathy, unspecified: Secondary | ICD-10-CM

## 2017-11-16 DIAGNOSIS — I872 Venous insufficiency (chronic) (peripheral): Secondary | ICD-10-CM

## 2017-11-16 DIAGNOSIS — I5032 Chronic diastolic (congestive) heart failure: Secondary | ICD-10-CM | POA: Diagnosis not present

## 2017-11-16 LAB — CBC WITH DIFFERENTIAL/PLATELET
BASOS PCT: 1.3 % (ref 0.0–3.0)
Basophils Absolute: 0.1 10*3/uL (ref 0.0–0.1)
Eosinophils Absolute: 0 10*3/uL (ref 0.0–0.7)
Eosinophils Relative: 0.1 % (ref 0.0–5.0)
HCT: 33.7 % — ABNORMAL LOW (ref 36.0–46.0)
HEMOGLOBIN: 11.4 g/dL — AB (ref 12.0–15.0)
Lymphocytes Relative: 28 % (ref 12.0–46.0)
Lymphs Abs: 2.2 10*3/uL (ref 0.7–4.0)
MCHC: 33.7 g/dL (ref 30.0–36.0)
MCV: 96.3 fl (ref 78.0–100.0)
MONO ABS: 0.6 10*3/uL (ref 0.1–1.0)
Monocytes Relative: 8.2 % (ref 3.0–12.0)
Neutro Abs: 4.9 10*3/uL (ref 1.4–7.7)
Neutrophils Relative %: 62.4 % (ref 43.0–77.0)
Platelets: 307 10*3/uL (ref 150.0–400.0)
RBC: 3.5 Mil/uL — AB (ref 3.87–5.11)
RDW: 14.5 % (ref 11.5–15.5)
WBC: 7.9 10*3/uL (ref 4.0–10.5)

## 2017-11-16 LAB — NITRIC OXIDE: NITRIC OXIDE: 18

## 2017-11-16 NOTE — Assessment & Plan Note (Signed)
Recent exacerbation now resolving , increased symptoms off Budesonide Garlon Hatchet - will restart .  Hold off on steroids for now .  No sign of infection .  Appears euvolemic - cont on current regimen   Plan  Patient Instructions  Restart budesonide and Brovana nebulizer twice daily May use albuterol inhaler or nebulizer as needed for wheezing-this is your rescue medication Labs today Follow up with PCP and Cardiology as discussed  Follow up with Dr. Melvyn Novas  In 6-8 weeks and As needed   Please contact office for sooner follow up if symptoms do not improve or worsen or seek emergency care

## 2017-11-16 NOTE — Progress Notes (Signed)
@Patient  ID: [name], female    DOB: 03-11-1951, 67 y.o.   MRN: [id number]  Chief Complaint  Patient presents with  . Acute Visit    Asthma     Referring provider: [name], MD  HPI: 54 yobf former smoker followed for Asthma  PMH + CAD, DM , D CHF   TEST  FEN0 01/24/2016= 13 with active symptoms on symb 160 2bid >changed to dulera 100  - Spirometry 02/23/2016 NO signifiant obstruction with active symptoms  - FENO 02/23/2016= 12 On dulera 100 2bid (unable to confirm adeherence as did not bring it - Singulair 10 mg daily added 10/25/2017to dulera 100 2bid with 2 week sample only (unable to verify she used it) - FENO 04/07/2016= 43 -CTa CHest 2017 >neg PE . 4 mm RUL nodule  CT sinus 2016 neg IgE 87 2017, positive Rast grass, dust, tree High-resolution CT chest April 2018--NEG  for ILD, stable 4 mm right upper lobe nodule. Echo 07/2017 EF 65%, Gr 2 DD , PAP 60mmHg  Spirometry 08/2017 Moderate restriction , no obstruction .   11/16/2017 Acute OV : Asthma  Pt returns for an acute office visit.  Patient says over the last couple weeks breathing has not been doing as well as she would like.  This occurred after stopping Brovana and budesonide. She denies any increased cough or wheezing.  She has noticed that she does not have much energy and gets winded easily.  She is followed by her Set designer.  Was instructed to stop her nebulizers and use them as needed.  I believe what it happened was she was instructed to use her albuterol nebulizer as needed.  We discussed continuing her maintenance nebulizer medications with Brovana and budesonide.. She denies any fever, discolored mucus, chest pain, orthopnea or increased leg swelling. Says she has been having foot pain is going to the podiatrist today. FENO today was 18 ppb.    Allergies  Allergen Reactions  . Amlodipine Besylate Other (See Comments)    Reaction unknown  . Lisinopril Other (See  Comments)    Reaction unknown  . Pantoprazole Sodium Other (See Comments)    Reaction unknown    Immunization History  Administered Date(s) Administered  . Influenza Split 03/10/2012, 03/14/2015  . Influenza, High Dose Seasonal PF 01/29/2017  . Influenza-Unspecified 01/11/2016  . Zoster Recombinat (Shingrix) 08/28/2017, 10/09/2017    Past Medical History:  Diagnosis Date  . Anxiety   . Asthma   . Bronchitis   . Chronic diastolic CHF (congestive heart failure) ([place])   . Chronic diastolic CHF (congestive heart failure) ([place]) 07/17/2016  . Coronary artery disease    a. NSTEMI 12/2015 - LHC 01/18/16: s/p overlapping DESx2 to RCA, 10% ost-prox Cx,10% mLAD.  Marland Kitchen Depression   . Diabetes mellitus   . Dyslipidemia   . GERD (gastroesophageal reflux disease)   . Heart attack ([place])   . Hypertension   . Hypertensive heart disease   . NSTEMI (non-ST elevated myocardial infarction) ([place]) 12/2015  . Pneumonia     Tobacco History: Social History   Tobacco Use  Smoking Status Former Smoker  . Packs/day: 1.00  . Years: 30.00  . Pack years: 30.00  . Types: Cigarettes  . Last attempt to quit: 01/09/2004  . Years since quitting: 13.8  Smokeless Tobacco Never Used   Counseling given: Not Answered   Outpatient Medications Prior to Visit  Medication Sig Dispense Refill  . albuterol (PROAIR HFA) 108 (90 Base) MCG/ACT  inhaler Inhale 2 puffs into the lungs every 4 (four) hours as needed for wheezing or shortness of breath. 1 Inhaler 5  . albuterol (PROVENTIL) (2.5 MG/3ML) 0.083% nebulizer solution Take 3 mLs (2.5 mg total) by nebulization 3 (three) times daily. And every 4 hours as needed 500 mL 1  . ALPRAZolam (XANAX) 0.5 MG tablet Take 1 tablet (0.5 mg total) by mouth 2 (two) times daily as needed for anxiety. 30 tablet 0  . arformoterol (BROVANA) 15 MCG/2ML NEBU Take 2 mLs (15 mcg total) by nebulization 2 (two) times daily. 120 mL 3  . aspirin EC 81 MG tablet Take 81 mg by mouth daily.       Marland Kitchen atorvastatin (LIPITOR) 40 MG tablet Take 40 mg by mouth daily.    . Biotin 5000 MCG CAPS Take 1 capsule by mouth daily.    . budesonide (PULMICORT) 0.5 MG/2ML nebulizer solution Take 2 mLs (0.5 mg total) by nebulization 2 (two) times daily. 120 mL 12  . diltiazem (CARDIZEM CD) 240 MG 24 hr capsule Take 1 capsule (240 mg total) by mouth daily. 90 capsule 3  . divalproex (DEPAKOTE ER) 500 MG 24 hr tablet Take 2 tablets (1,000 mg total) by mouth at bedtime. 60 tablet 11  . esomeprazole (NEXIUM) 40 MG capsule Take 1 capsule (40 mg total) by mouth 2 (two) times daily before a meal. 60 capsule 3  . estradiol (ESTRACE) 1 MG tablet Take 1 mg by mouth daily.     . famotidine (PEPCID) 10 MG tablet Take 1 tablet (10 mg total) by mouth daily. 30 tablet 3  . ferrous sulfate 325 (65 FE) MG tablet Take 1 tablet (325 mg total) by mouth 2 (two) times daily with a meal. 60 tablet 3  . fluticasone (FLONASE) 50 MCG/ACT nasal spray Place 2 sprays into both nostrils daily. 16 g 2  . glimepiride (AMARYL) 2 MG tablet Take 1 tablet (2 mg total) by mouth every morning. While on steroids and CBG's running over 170's 30 tablet 0  . guaiFENesin-codeine 100-10 MG/5ML syrup Take 5 mLs by mouth 3 (three) times daily as needed for cough. 120 mL 0  . hydrALAZINE (APRESOLINE) 25 MG tablet Take 1 tablet (25 mg total) by mouth 2 (two) times daily. 180 tablet 3  . irbesartan (AVAPRO) 300 MG tablet Take 1 tablet (300 mg total) by mouth daily. Hold until follow-up with Dr Melvyn Novas    . loratadine (CLARITIN) 10 MG tablet Take 1 tablet (10 mg total) by mouth daily. 30 tablet 0  . metFORMIN (GLUCOPHAGE) 1000 MG tablet Take 1 tablet (1,000 mg total) by mouth 2 (two) times daily with a meal. 60 tablet 3  . metoCLOPramide (REGLAN) 5 MG tablet Take 1 tablet (5 mg total) by mouth 4 (four) times daily -  before meals and at bedtime. 120 tablet 3  . montelukast (SINGULAIR) 10 MG tablet Take 1 tablet (10 mg total) by mouth daily. 30 tablet 3  .  nebivolol (BYSTOLIC) 5 MG tablet Take 5 mg by mouth daily.    . nitroGLYCERIN (NITROSTAT) 0.4 MG SL tablet Place 1 tablet (0.4 mg total) under the tongue every 5 (five) minutes as needed for chest pain. x3 as needed 25 tablet 6  . potassium chloride SA (K-DUR,KLOR-CON) 20 MEQ tablet Take 20 mEq by mouth daily.     . predniSONE (DELTASONE) 10 MG tablet Prednisone dosing: Take  Prednisone 40mg  (4 tabs) x 3 days, then taper to 30mg  (3 tabs) x 3  days, then 20mg  (2 tabs) x 3days, then 10mg  (1 tab) x 3days, then OFF.  Dispense:  30 tabs, refills: None 30 tablet 0  . torsemide (DEMADEX) 20 MG tablet Take 20-40 mg by mouth daily. May take an extra tablet if needed.     No facility-administered medications prior to visit.      Review of Systems  Constitutional:   No  weight loss, night sweats,  Fevers, chills,  +fatigue, or  lassitude.  HEENT:   No headaches,  Difficulty swallowing,  Tooth/dental problems, or  Sore throat,                No sneezing, itching, ear ache,  +nasal congestion, post nasal drip,   CV:  No chest pain,  Orthopnea, PND, swelling in lower extremities, anasarca, dizziness, palpitations, syncope.   GI  No heartburn, indigestion, abdominal pain, nausea, vomiting, diarrhea, change in bowel habits, loss of appetite, bloody stools.   Resp:    No chest wall deformity  Skin: no rash or lesions.  GU: no dysuria, change in color of urine, no urgency or frequency.  No flank pain, no hematuria   MS:  No joint pain or swelling.  No decreased range of motion.  No back pain.    Physical Exam  BP 132/80 (BP Location: Left Arm, Cuff Size: Normal)   Pulse 82   Ht 5\' 2"  (1.575 m)   Wt 164 lb 9.6 oz (74.7 kg)   LMP  (LMP Unknown)   SpO2 97%   BMI 30.11 kg/m   GEN: A/Ox3; pleasant , NAD, obese    HEENT:  [place]/AT,  EACs-clear, TMs-wnl, NOSE-clear, THROAT-clear, no lesions, no postnasal drip or exudate noted.   NECK:  Supple w/ fair ROM; no JVD; normal carotid impulses w/o  bruits; no thyromegaly or nodules palpated; no lymphadenopathy.    RESP  Clear  P & A; w/o, wheezes/ rales/ or rhonchi. no accessory muscle use, no dullness to percussion  CARD:  RRR, no m/r/g, tr  peripheral edema, pulses intact, no cyanosis or clubbing.  GI:   Soft & nt; nml bowel sounds; no organomegaly or masses detected.   Musco: Warm bil, no deformities or joint swelling noted.   Neuro: alert, no focal deficits noted.    Skin: Warm, no lesions or rashes    Lab Results:  CBC    Component Value Date/Time   WBC 7.9 11/16/2017 1234   RBC 3.50 (L) 11/16/2017 1234   HGB 11.4 (L) 11/16/2017 1234   HGB 11.2 09/17/2017 1550   HCT 33.7 (L) 11/16/2017 1234   HCT 34.2 09/17/2017 1550   PLT 307.0 11/16/2017 1234   PLT 338 09/17/2017 1550   MCV 96.3 11/16/2017 1234   MCV 95 09/17/2017 1550   MCH 31.1 10/09/2017 0731   MCHC 33.7 11/16/2017 1234   RDW 14.5 11/16/2017 1234   RDW 14.4 09/17/2017 1550   LYMPHSABS 2.2 11/16/2017 1234   LYMPHSABS 2.2 05/17/2017 1141   MONOABS 0.6 11/16/2017 1234   EOSABS 0.0 11/16/2017 1234   EOSABS 0.1 05/17/2017 1141   BASOSABS 0.1 11/16/2017 1234   BASOSABS 0.1 05/17/2017 1141    BMET    Component Value Date/Time   NA 135 10/16/2017 0311   NA 142 09/17/2017 1550   K 4.4 10/16/2017 0311   CL 96 (L) 10/16/2017 0311   CO2 31 10/16/2017 0311   GLUCOSE 256 (H) 10/16/2017 0311   BUN 27 (H) 10/16/2017 0311   BUN 28 (H)  09/17/2017 1550   CREATININE 1.13 (H) 10/16/2017 0311   CREATININE 1.06 (H) 01/26/2016 1608   CALCIUM 9.4 10/16/2017 0311   GFRNONAA 49 (L) 10/16/2017 0311   GFRAA 57 (L) 10/16/2017 0311    BNP    Component Value Date/Time   BNP 30.0 10/05/2017 1625   BNP <4.0 01/26/2016 1608    ProBNP    Component Value Date/Time   PROBNP 13.0 02/23/2016 1122    Imaging: No results found.   Assessment & Plan:   Asthma exacerbation Recent exacerbation now resolving , increased symptoms off Budesonide Garlon Hatchet - will  restart .  Hold off on steroids for now .  No sign of infection .  Appears euvolemic - cont on current regimen   Plan  Patient Instructions  Restart budesonide and Brovana nebulizer twice daily May use albuterol inhaler or nebulizer as needed for wheezing-this is your rescue medication Labs today Follow up with PCP and Cardiology as discussed  Follow up with Dr. Melvyn Novas  In 6-8 weeks and As needed   Please contact office for sooner follow up if symptoms do not improve or worsen or seek emergency care       Chronic diastolic CHF (congestive heart failure) ([place]) Appears compensated - cont current regimen  follow up with Cards next week as planned.      Rexene Edison, NP 11/16/2017

## 2017-11-16 NOTE — Patient Instructions (Addendum)
Restart budesonide and Brovana nebulizer twice daily May use albuterol inhaler or nebulizer as needed for wheezing-this is your rescue medication Labs today Follow up with PCP and Cardiology as discussed  Follow up with Dr. Melvyn Novas  In 6-8 weeks and As needed   Please contact office for sooner follow up if symptoms do not improve or worsen or seek emergency care

## 2017-11-16 NOTE — Assessment & Plan Note (Signed)
Appears compensated - cont current regimen  follow up with Cards next week as planned.

## 2017-11-17 NOTE — Progress Notes (Signed)
Chart and office note reviewed in detail  > agree with a/p as outlined    

## 2017-11-18 NOTE — Progress Notes (Signed)
Subjective:  Patient ID: [name], female    DOB: [birth year],  MRN: [id number]  Chief Complaint  Patient presents with  . Foot Pain    Dorsal/lateral midfoot left - swelling, extremely sensitive x 1 month, no injury, been in hospital for asthma, no treatment   67 y.o. female presents with the above complaint.  Reports pain to the top of the left midfoot.  Endorses swelling as well.  Has been in the hospital for asthma. Past Medical History:  Diagnosis Date  . Anxiety   . Asthma   . Bronchitis   . Chronic diastolic CHF (congestive heart failure) ([place])   . Chronic diastolic CHF (congestive heart failure) ([place]) 07/17/2016  . Coronary artery disease    a. NSTEMI 12/2015 - LHC 01/18/16: s/p overlapping DESx2 to RCA, 10% ost-prox Cx,10% mLAD.  Marland Kitchen Depression   . Diabetes mellitus   . Dyslipidemia   . GERD (gastroesophageal reflux disease)   . Heart attack ([place])   . Hypertension   . Hypertensive heart disease   . NSTEMI (non-ST elevated myocardial infarction) ([place]) 12/2015  . Pneumonia    Past Surgical History:  Procedure Laterality Date  . ABDOMINAL HYSTERECTOMY    . CARDIAC CATHETERIZATION  1994   minimal LAD dz, no other dz, EF normal  . CARDIAC CATHETERIZATION N/A 01/18/2016   Procedure: Left Heart Cath and Coronary Angiography;  Surgeon: Burnell Blanks, MD;  Location: [facility];  Service: Cardiovascular;  Laterality: N/A;  . CARDIAC CATHETERIZATION N/A 01/18/2016   Procedure: Coronary Stent Intervention;  Surgeon: Burnell Blanks, MD;  Location: [facility];  Service: Cardiovascular;  Laterality: N/A;  . COLONOSCOPY WITH PROPOFOL N/A 04/09/2014   Procedure: COLONOSCOPY WITH PROPOFOL;  Surgeon: Cleotis Nipper, MD;  Location: [facility];  Service: Endoscopy;  Laterality: N/A;  . CORONARY STENT PLACEMENT  01/19/2016   STENT SYNERGY DES 9.37J69 drug eluting stent was successfully placed, and overlaps the 2.5 x 38 mm Synergy stent placed distally.    . ESOPHAGOGASTRODUODENOSCOPY (EGD) WITH PROPOFOL N/A 04/09/2014   Procedure: ESOPHAGOGASTRODUODENOSCOPY (EGD) WITH PROPOFOL;  Surgeon: Cleotis Nipper, MD;  Location: [facility];  Service: Endoscopy;  Laterality: N/A;  . HEMORRHOID SURGERY    . HERNIA REPAIR    . KNEE ARTHROSCOPY      Current Outpatient Medications:  .  albuterol (PROAIR HFA) 108 (90 Base) MCG/ACT inhaler, Inhale 2 puffs into the lungs every 4 (four) hours as needed for wheezing or shortness of breath., Disp: 1 Inhaler, Rfl: 5 .  albuterol (PROVENTIL) (2.5 MG/3ML) 0.083% nebulizer solution, Take 3 mLs (2.5 mg total) by nebulization 3 (three) times daily. And every 4 hours as needed, Disp: 500 mL, Rfl: 1 .  ALPRAZolam (XANAX) 0.5 MG tablet, Take 1 tablet (0.5 mg total) by mouth 2 (two) times daily as needed for anxiety., Disp: 30 tablet, Rfl: 0 .  arformoterol (BROVANA) 15 MCG/2ML NEBU, Take 2 mLs (15 mcg total) by nebulization 2 (two) times daily., Disp: 120 mL, Rfl: 3 .  aspirin EC 81 MG tablet, Take 81 mg by mouth daily. , Disp: , Rfl:  .  atorvastatin (LIPITOR) 40 MG tablet, Take 40 mg by mouth daily., Disp: , Rfl:  .  Biotin 5000 MCG CAPS, Take 1 capsule by mouth daily., Disp: , Rfl:  .  budesonide (PULMICORT) 0.5 MG/2ML nebulizer solution, Take 2 mLs (0.5 mg total) by nebulization 2 (two) times daily., Disp: 120 mL, Rfl: 12 .  diltiazem (CARDIZEM  CD) 240 MG 24 hr capsule, Take 1 capsule (240 mg total) by mouth daily., Disp: 90 capsule, Rfl: 3 .  divalproex (DEPAKOTE ER) 500 MG 24 hr tablet, Take 2 tablets (1,000 mg total) by mouth at bedtime., Disp: 60 tablet, Rfl: 11 .  esomeprazole (NEXIUM) 40 MG capsule, Take 1 capsule (40 mg total) by mouth 2 (two) times daily before a meal., Disp: 60 capsule, Rfl: 3 .  estradiol (ESTRACE) 1 MG tablet, Take 1 mg by mouth daily. , Disp: , Rfl:  .  famotidine (PEPCID) 10 MG tablet, Take 1 tablet (10 mg total) by mouth daily., Disp: 30 tablet, Rfl: 3 .  ferrous sulfate 325 (65 FE) MG  tablet, Take 1 tablet (325 mg total) by mouth 2 (two) times daily with a meal., Disp: 60 tablet, Rfl: 3 .  fluticasone (FLONASE) 50 MCG/ACT nasal spray, Place 2 sprays into both nostrils daily., Disp: 16 g, Rfl: 2 .  glimepiride (AMARYL) 2 MG tablet, Take 1 tablet (2 mg total) by mouth every morning. While on steroids and CBG's running over 170's, Disp: 30 tablet, Rfl: 0 .  guaiFENesin-codeine 100-10 MG/5ML syrup, Take 5 mLs by mouth 3 (three) times daily as needed for cough., Disp: 120 mL, Rfl: 0 .  hydrALAZINE (APRESOLINE) 25 MG tablet, Take 1 tablet (25 mg total) by mouth 2 (two) times daily., Disp: 180 tablet, Rfl: 3 .  irbesartan (AVAPRO) 300 MG tablet, Take 1 tablet (300 mg total) by mouth daily. Hold until follow-up with Dr Melvyn Novas, Disp: , Rfl:  .  loratadine (CLARITIN) 10 MG tablet, Take 1 tablet (10 mg total) by mouth daily., Disp: 30 tablet, Rfl: 0 .  metFORMIN (GLUCOPHAGE) 1000 MG tablet, Take 1 tablet (1,000 mg total) by mouth 2 (two) times daily with a meal., Disp: 60 tablet, Rfl: 3 .  metoCLOPramide (REGLAN) 5 MG tablet, Take 1 tablet (5 mg total) by mouth 4 (four) times daily -  before meals and at bedtime., Disp: 120 tablet, Rfl: 3 .  montelukast (SINGULAIR) 10 MG tablet, Take 1 tablet (10 mg total) by mouth daily., Disp: 30 tablet, Rfl: 3 .  nebivolol (BYSTOLIC) 5 MG tablet, Take 5 mg by mouth daily., Disp: , Rfl:  .  nitroGLYCERIN (NITROSTAT) 0.4 MG SL tablet, Place 1 tablet (0.4 mg total) under the tongue every 5 (five) minutes as needed for chest pain. x3 as needed, Disp: 25 tablet, Rfl: 6 .  potassium chloride SA (K-DUR,KLOR-CON) 20 MEQ tablet, Take 20 mEq by mouth daily. , Disp: , Rfl:  .  predniSONE (DELTASONE) 10 MG tablet, Prednisone dosing: Take  Prednisone 40mg  (4 tabs) x 3 days, then taper to 30mg  (3 tabs) x 3 days, then 20mg  (2 tabs) x 3days, then 10mg  (1 tab) x 3days, then OFF.  Dispense:  30 tabs, refills: None, Disp: 30 tablet, Rfl: 0 .  torsemide (DEMADEX) 20 MG tablet,  Take 20-40 mg by mouth daily. May take an extra tablet if needed., Disp: , Rfl:  .  traMADol (ULTRAM) 50 MG tablet, Take 50 mg by mouth every 6 (six) hours as needed. for pain, Disp: , Rfl: 0  Allergies  Allergen Reactions  . Amlodipine Besylate Other (See Comments)    Reaction unknown  . Lisinopril Other (See Comments)    Reaction unknown  . Pantoprazole Sodium Other (See Comments)    Reaction unknown   Review of Systems: Negative except as noted in the HPI. Denies N/V/F/Ch. Objective:  There were no vitals filed for  this visit. General AA&O x3. Normal mood and affect.  Vascular Dorsalis pedis and posterior tibial pulses  present 2+ bilaterally  Capillary refill normal to all digits. Pedal hair growth normal. Thin shiny skin bilateral with edema bilateral.  Neurologic Epicritic sensation grossly present.  Dermatologic No open lesions. Interspaces clear of maceration. Nails well groomed and normal in appearance.  Orthopedic: MMT 5/5 in dorsiflexion, plantarflexion, inversion, and eversion. Pain palpation of the dorsal midfoot left.   Assessment & Plan:  Patient was evaluated and treated and all questions answered.  Midfoot Arthritis L -X-rays taken reviewed.  No acute fracture dislocation midfoot DJD noted with prominent dorsal spur. -Injection delivered left dorsal midfoot  Procedure: Joint Injection Location: Left 1st/2nd TMT joint Skin Prep: Alcohol. Injectate: 0.5 cc 1% lidocaine plain, 0.5 cc dexamethasone phosphate. Disposition: Patient tolerated procedure well. Injection site dressed with a band-aid.     Venous Insufficiency -Unna boot compressive bandage applied Return in about 6 weeks (around 12/28/2017) for Midfoot , Arthritis, Left; Venous Insufficiency.

## 2017-11-19 LAB — IGE: IgE (Immunoglobulin E), Serum: 96 kU/L (ref <=114)

## 2017-11-20 ENCOUNTER — Ambulatory Visit: Payer: Medicare Other | Admitting: Internal Medicine

## 2017-11-30 ENCOUNTER — Encounter: Payer: Self-pay | Admitting: Cardiology

## 2017-11-30 ENCOUNTER — Ambulatory Visit (INDEPENDENT_AMBULATORY_CARE_PROVIDER_SITE_OTHER): Payer: Medicare Other | Admitting: Cardiology

## 2017-11-30 VITALS — BP 128/70 | HR 80 | Ht 62.0 in | Wt 165.6 lb

## 2017-11-30 DIAGNOSIS — I2510 Atherosclerotic heart disease of native coronary artery without angina pectoris: Secondary | ICD-10-CM | POA: Diagnosis not present

## 2017-11-30 DIAGNOSIS — I10 Essential (primary) hypertension: Secondary | ICD-10-CM

## 2017-11-30 DIAGNOSIS — E7800 Pure hypercholesterolemia, unspecified: Secondary | ICD-10-CM | POA: Diagnosis not present

## 2017-11-30 NOTE — Progress Notes (Signed)
Cardiology Office Note    Date:  11/30/2017   ID:  ISAMAR NAZIR, DOB 1951-03-14, MRN [id number]  PCP:  [name], MD  Cardiologist:   Candee Furbish, MD     History of Present Illness:  [name] is a 67 y.o. female with coronary artery disease status post drug-eluting stent 2 to the distal RCA in September 2017 in the setting of non-ST elevation myocardial infarction with concomitant asthma, diabetes, hypertension, hyperlipidemia, prior pulmonary embolism and chronic diastolic heart failure.  Ejection fraction was normal 65-70% with mild mitral regurgitation. Bystolic utilized because of severe asthma.  She reported easy bruising.Mild to moderate in severity, all over her body at times. No major melena, hematemesis. This is an annoyance to her.  She was in the hospital in March 2018 with asthma flare. She had influenza B. Tamiflu. Dyspnea improved.She is currently having no chest pain. No shortness of breath.  11/30/17 -  Admitted again in 09/2017 with asthma. Diuresed 10L as well with IV lasix. On Torsemide.  She was having increased wt gain and SOB, called 11/03/17. Took extra torsemide (in addition to her 20mg ).  Overall she has been doing quite well.  She did go down to [place] to get TED hose.  They were $6 a pair.  They have helped her out a lot.  No chest pain, no current shortness of breath.  No bleeding, no syncope.  Past Medical History:  Diagnosis Date  . Anxiety   . Asthma   . Bronchitis   . Chronic diastolic CHF (congestive heart failure) ([place])   . Chronic diastolic CHF (congestive heart failure) ([place]) 07/17/2016  . Coronary artery disease    a. NSTEMI 12/2015 - LHC 01/18/16: s/p overlapping DESx2 to RCA, 10% ost-prox Cx,10% mLAD.  Marland Kitchen Depression   . Diabetes mellitus   . Dyslipidemia   . GERD (gastroesophageal reflux disease)   . Heart attack ([place])   . Hypertension   . Hypertensive heart disease   . NSTEMI (non-ST elevated myocardial infarction) ([place]) 12/2015    . Pneumonia     Past Surgical History:  Procedure Laterality Date  . ABDOMINAL HYSTERECTOMY    . CARDIAC CATHETERIZATION  1994   minimal LAD dz, no other dz, EF normal  . CARDIAC CATHETERIZATION N/A 01/18/2016   Procedure: Left Heart Cath and Coronary Angiography;  Surgeon: Burnell Blanks, MD;  Location: [facility];  Service: Cardiovascular;  Laterality: N/A;  . CARDIAC CATHETERIZATION N/A 01/18/2016   Procedure: Coronary Stent Intervention;  Surgeon: Burnell Blanks, MD;  Location: [facility];  Service: Cardiovascular;  Laterality: N/A;  . COLONOSCOPY WITH PROPOFOL N/A 04/09/2014   Procedure: COLONOSCOPY WITH PROPOFOL;  Surgeon: Cleotis Nipper, MD;  Location: [facility];  Service: Endoscopy;  Laterality: N/A;  . CORONARY STENT PLACEMENT  01/19/2016   STENT SYNERGY DES 5.68L27 drug eluting stent was successfully placed, and overlaps the 2.5 x 38 mm Synergy stent placed distally.  . ESOPHAGOGASTRODUODENOSCOPY (EGD) WITH PROPOFOL N/A 04/09/2014   Procedure: ESOPHAGOGASTRODUODENOSCOPY (EGD) WITH PROPOFOL;  Surgeon: Cleotis Nipper, MD;  Location: [facility];  Service: Endoscopy;  Laterality: N/A;  . HEMORRHOID SURGERY    . HERNIA REPAIR    . KNEE ARTHROSCOPY      Current Medications: Outpatient Medications Prior to Visit  Medication Sig Dispense Refill  . albuterol (PROAIR HFA) 108 (90 Base) MCG/ACT inhaler Inhale 2 puffs into the lungs every 4 (four) hours as needed for wheezing or  shortness of breath. 1 Inhaler 5  . albuterol (PROVENTIL) (2.5 MG/3ML) 0.083% nebulizer solution Take 3 mLs (2.5 mg total) by nebulization 3 (three) times daily. And every 4 hours as needed 500 mL 1  . ALPRAZolam (XANAX) 0.5 MG tablet Take 1 tablet (0.5 mg total) by mouth 2 (two) times daily as needed for anxiety. 30 tablet 0  . arformoterol (BROVANA) 15 MCG/2ML NEBU Take 2 mLs (15 mcg total) by nebulization 2 (two) times daily. 120 mL 3  . aspirin EC 81 MG tablet Take 81  mg by mouth daily.     Marland Kitchen atorvastatin (LIPITOR) 40 MG tablet Take 40 mg by mouth daily.    . Biotin 5000 MCG CAPS Take 1 capsule by mouth daily.    . budesonide (PULMICORT) 0.5 MG/2ML nebulizer solution Take 2 mLs (0.5 mg total) by nebulization 2 (two) times daily. 120 mL 12  . diltiazem (CARDIZEM CD) 240 MG 24 hr capsule Take 1 capsule (240 mg total) by mouth daily. 90 capsule 3  . divalproex (DEPAKOTE ER) 500 MG 24 hr tablet Take 2 tablets (1,000 mg total) by mouth at bedtime. 60 tablet 11  . esomeprazole (NEXIUM) 40 MG capsule Take 1 capsule (40 mg total) by mouth 2 (two) times daily before a meal. 60 capsule 3  . estradiol (ESTRACE) 1 MG tablet Take 1 mg by mouth daily.     . fluticasone (FLONASE) 50 MCG/ACT nasal spray Place 2 sprays into both nostrils daily. 16 g 2  . hydrALAZINE (APRESOLINE) 25 MG tablet Take 1 tablet (25 mg total) by mouth 2 (two) times daily. 180 tablet 3  . irbesartan (AVAPRO) 300 MG tablet Take 1 tablet (300 mg total) by mouth daily. Hold until follow-up with Dr Melvyn Novas    . metFORMIN (GLUCOPHAGE) 1000 MG tablet Take 1 tablet (1,000 mg total) by mouth 2 (two) times daily with a meal. 60 tablet 3  . montelukast (SINGULAIR) 10 MG tablet Take 1 tablet (10 mg total) by mouth daily. 30 tablet 3  . nebivolol (BYSTOLIC) 5 MG tablet Take 5 mg by mouth daily.    . nitroGLYCERIN (NITROSTAT) 0.4 MG SL tablet Place 1 tablet (0.4 mg total) under the tongue every 5 (five) minutes as needed for chest pain. x3 as needed 25 tablet 6  . potassium chloride SA (K-DUR,KLOR-CON) 20 MEQ tablet Take 20 mEq by mouth daily.     Marland Kitchen torsemide (DEMADEX) 20 MG tablet Take 20-40 mg by mouth daily. May take an extra tablet if needed.    . famotidine (PEPCID) 10 MG tablet Take 1 tablet (10 mg total) by mouth daily. (Patient not taking: Reported on 11/30/2017) 30 tablet 3  . ferrous sulfate 325 (65 FE) MG tablet Take 1 tablet (325 mg total) by mouth 2 (two) times daily with a meal. (Patient not taking:  Reported on 11/30/2017) 60 tablet 3  . glimepiride (AMARYL) 2 MG tablet Take 1 tablet (2 mg total) by mouth every morning. While on steroids and CBG's running over 170's (Patient not taking: Reported on 11/30/2017) 30 tablet 0  . guaiFENesin-codeine 100-10 MG/5ML syrup Take 5 mLs by mouth 3 (three) times daily as needed for cough. (Patient not taking: Reported on 11/30/2017) 120 mL 0  . loratadine (CLARITIN) 10 MG tablet Take 1 tablet (10 mg total) by mouth daily. (Patient not taking: Reported on 11/30/2017) 30 tablet 0  . metoCLOPramide (REGLAN) 5 MG tablet Take 1 tablet (5 mg total) by mouth 4 (four) times daily -  before meals and at bedtime. (Patient not taking: Reported on 11/30/2017) 120 tablet 3  . predniSONE (DELTASONE) 10 MG tablet Prednisone dosing: Take  Prednisone 40mg  (4 tabs) x 3 days, then taper to 30mg  (3 tabs) x 3 days, then 20mg  (2 tabs) x 3days, then 10mg  (1 tab) x 3days, then OFF.  Dispense:  30 tabs, refills: None (Patient not taking: Reported on 11/30/2017) 30 tablet 0  . traMADol (ULTRAM) 50 MG tablet Take 50 mg by mouth every 6 (six) hours as needed. for pain  0   No facility-administered medications prior to visit.      Allergies:   Amlodipine besylate; Lisinopril; and Pantoprazole sodium   Social History   Socioeconomic History  . Marital status: Married    Spouse name: Not on file  . Number of children: 3  . Years of education: some college  . Highest education level: Not on file  Occupational History  . Occupation: Education officer, community  . Financial resource strain: Not on file  . Food insecurity:    Worry: Not on file    Inability: Not on file  . Transportation needs:    Medical: Not on file    Non-medical: Not on file  Tobacco Use  . Smoking status: Former Smoker    Packs/day: 1.00    Years: 30.00    Pack years: 30.00    Types: Cigarettes    Last attempt to quit: 01/09/2004    Years since quitting: 13.9  . Smokeless tobacco: Never Used  Substance and  Sexual Activity  . Alcohol use: No    Alcohol/week: 0.0 oz  . Drug use: No  . Sexual activity: Not on file  Lifestyle  . Physical activity:    Days per week: Not on file    Minutes per session: Not on file  . Stress: Not on file  Relationships  . Social connections:    Talks on phone: Not on file    Gets together: Not on file    Attends religious service: Not on file    Active member of club or organization: Not on file    Attends meetings of clubs or organizations: Not on file    Relationship status: Not on file  Other Topics Concern  . Not on file  Social History Narrative   Pt lives with husband in [place].   Right-handed.   2 cups caffeine per day.     Family History:  The patient's family history includes Allergies in her mother; Asthma in her mother; CAD (age of onset: 52) in her father; CAD (age of onset: 2) in her sister; Congestive Heart Failure in her sister; Diabetes in her father; Hypertension in her father.   ROS:   Please see the history of present illness.    Review of Systems  All other systems reviewed and are negative.     PHYSICAL EXAM:   VS:  BP 128/70   Pulse 80   LMP  (LMP Unknown)    GEN: Well nourished, well developed, in no acute distress  HEENT: normal  Neck: no JVD, carotid bruits, or masses Cardiac: RRR; no murmurs, rubs, or gallops,no edema  Respiratory:  clear to auscultation bilaterally, normal work of breathing GI: soft, nontender, nondistended, + BS MS: no deformity or atrophy  Skin: warm and dry, no rash Neuro:  Alert and Oriented x 3, Strength and sensation are intact Psych: euthymic mood, full affect   Wt Readings from Last 3 Encounters:  11/16/17 164 lb 9.6 oz (74.7 kg)  10/23/17 166 lb 9.6 oz (75.6 kg)  10/18/17 168 lb 9.6 oz (76.5 kg)      Studies/Labs Reviewed:   EKG:  EKG is not ordered today.    Recent Labs: 10/05/2017: B Natriuretic Peptide 30.0 10/06/2017: ALT 17; Magnesium 1.7; TSH 0.534 10/16/2017: BUN 27;  Creatinine, Ser 1.13; Potassium 4.4; Sodium 135 11/16/2017: Hemoglobin 11.4; Platelets 307.0   Lipid Panel    Component Value Date/Time   CHOL 196 07/18/2016 0142   TRIG 203 (H) 07/18/2016 0142   HDL 48 07/18/2016 0142   CHOLHDL 4.1 07/18/2016 0142   VLDL 41 (H) 07/18/2016 0142   LDLCALC 107 (H) 07/18/2016 0142    Additional studies/ records that were reviewed today include:      ASSESSMENT:    1. Coronary artery disease involving native heart without angina pectoris, unspecified vessel or lesion type   2. Essential hypertension   3. Pure hypercholesterolemia      PLAN:  In order of problems listed above:  CAD: s/p NSTEMI: cath on 01/18/2016 with severe single vessel CAD with severe stenosis in the mid and distal RCA with successful PTCA/DES x 2 mid and distal RCA. -- Continue DAPTwith ASA and Brilinta for one year. Continue statin &BB ( on bystolic in the setting of chronic lung disease)  -- Bruising noted from DAPT. No major bleeding. Continue.  - Stable. There was some worry about the CT scan done for lung disease. Her stents were noted. Other coronary calcification noted including left main. Continue with aggressive secondary prevention as we are currently prescribing and advocating.  No major changes.  No chest pain.  Chronic diastolic CHF: Appearseuvolemic today.  Back on torsemide.  She knows to take an extra if necessary.  She was diuresed 10 L at a previous hospitalization.  I had tried to take her off of diuretics previously but this did not work.  Aortic atherosclerosis: Continue statin, no myalgias  Chronic asthma: with recent exacerbation in the setting of acute bronchitis. Done with prednisone taper , no changes. Mild wheeze heard in right lower lung  HTN: BP much better controlled. Bystolic. Stable  HLD: LDL 110. Goal <70. Continue on atorva 80mg  daily    DM2: HgA1c 7.0. Continue Home rx.  Overall doing well.  Six-month follow-up with Cecille Rubin, 12  months with me  Medication Adjustments/Labs and Tests Ordered: Current medicines are reviewed at length with the patient today.  Concerns regarding medicines are outlined above.  Medication changes, Labs and Tests ordered today are listed in the Patient Instructions below. Patient Instructions  Medication Instructions:  The current medical regimen is effective;  continue present plan and medications.  Follow-Up: Follow up in 6 months with D.  You will receive a letter in the mail 2 months before you are due.  Please call us when you receive this letter to schedule your follow up appointment.  Follow up in 1 year with Dr. Marlou Porch.  You will receive a letter in the mail 2 months before you are due.  Please call us when you receive this letter to schedule your follow up appointment.  If you need a refill on your cardiac medications before your next appointment, please call your pharmacy.  Thank you for choosing [facility]!!        Signed, Candee Furbish, MD  11/30/2017 2:12 PM    [facility] [address], [place], [place]  [address] Phone: [phone])  [phone]; Fax: [phone]

## 2017-11-30 NOTE — Patient Instructions (Addendum)
Medication Instructions:  The current medical regimen is effective;  continue present plan and medications.  Follow-Up: Follow up in 6 months with Lori Gerhardt, NP.  You will receive a letter in the mail 2 months before you are due.  Please call us when you receive this letter to schedule your follow up appointment.  Follow up in 1 year with Dr. Skains.  You will receive a letter in the mail 2 months before you are due.  Please call us when you receive this letter to schedule your follow up appointment.  If you need a refill on your cardiac medications before your next appointment, please call your pharmacy.  Thank you for choosing [facility]!!     

## 2017-12-07 ENCOUNTER — Ambulatory Visit: Payer: Medicare Other | Admitting: Podiatry

## 2017-12-07 DIAGNOSIS — M19079 Primary osteoarthritis, unspecified ankle and foot: Secondary | ICD-10-CM

## 2017-12-07 DIAGNOSIS — R600 Localized edema: Secondary | ICD-10-CM

## 2017-12-07 DIAGNOSIS — M778 Other enthesopathies, not elsewhere classified: Secondary | ICD-10-CM

## 2017-12-07 DIAGNOSIS — I872 Venous insufficiency (chronic) (peripheral): Secondary | ICD-10-CM | POA: Diagnosis not present

## 2017-12-07 DIAGNOSIS — M779 Enthesopathy, unspecified: Secondary | ICD-10-CM | POA: Diagnosis not present

## 2017-12-14 ENCOUNTER — Telehealth: Payer: Self-pay | Admitting: Internal Medicine

## 2017-12-14 NOTE — Telephone Encounter (Signed)
Called and spoke with patient, she states that when she spoke with [employer] they told her there was two different dates on her FMLA that were written for her return to work date. Per Joellen Jersey she said to advised patient to call [employer] and see what the dates were and have them send it back. Patient verbalized understanding. Nothing further needed from [place] at this time

## 2017-12-17 ENCOUNTER — Telehealth: Payer: Self-pay | Admitting: Primary Care

## 2017-12-17 ENCOUNTER — Encounter: Payer: Self-pay | Admitting: Primary Care

## 2017-12-17 ENCOUNTER — Ambulatory Visit: Payer: Medicare Other | Admitting: Primary Care

## 2017-12-17 VITALS — BP 134/76 | HR 83 | Temp 97.8°F | Ht 62.0 in | Wt 164.0 lb

## 2017-12-17 DIAGNOSIS — R05 Cough: Secondary | ICD-10-CM

## 2017-12-17 DIAGNOSIS — J4551 Severe persistent asthma with (acute) exacerbation: Secondary | ICD-10-CM | POA: Diagnosis not present

## 2017-12-17 DIAGNOSIS — R058 Other specified cough: Secondary | ICD-10-CM

## 2017-12-17 DIAGNOSIS — J301 Allergic rhinitis due to pollen: Secondary | ICD-10-CM | POA: Diagnosis not present

## 2017-12-17 LAB — POCT EXHALED NITRIC OXIDE: FeNO level (ppb): 18

## 2017-12-17 MED ORDER — GABAPENTIN 100 MG PO CAPS
100.0000 mg | ORAL_CAPSULE | Freq: Three times a day (TID) | ORAL | 0 refills | Status: DC
Start: 2017-12-17 — End: 2018-01-15

## 2017-12-17 MED ORDER — PREDNISONE 10 MG PO TABS: ORAL_TABLET | ORAL | 0 refills | Status: DC

## 2017-12-17 MED ORDER — TRAMADOL HCL 50 MG PO TABS: 50.0000 mg | ORAL_TABLET | Freq: Four times a day (QID) | ORAL | 0 refills | Status: AC | PRN

## 2017-12-17 NOTE — Progress Notes (Signed)
@Patient  ID: [name], female    DOB: [birth year], 67 y.o.   MRN: [id number]  Chief Complaint  Patient presents with  . Acute Visit    MW pt with severe asthma presents with increased wheezing, sob, sinus congestion, pnd X1 day.      Referring provider: [name], MD  HPI: 67 year old female, former smoker (quit 2005). PMH mild persistent asthma, bronchitis, allergic rhinitis. Patient Dr. Melvyn Novas, last seen by pulm NP on 07/19 for worsening breathing symptoms after stopping Budesonide and Brovana. FENO 18. CBC showed improved anemia, normal wbc and eos. IgE 96, WNL.    12/17/2017 Patient presents today for acute visit. Complains of wheeze, sob, sinus congestion and dry cough x1 day. States that she was at a barbeque on Saturday 8/17 prior to symptoms starting. Continues Brovana and Budesonide as prescribed twice a day. Used prn albuterol once yesterday after symptoms developed.  She takes Singulair during the day. She has been using Flonase since yesterday. Unclear if nasal congestion is colored. Denies fever. FENO today 18.    Brovana $200    Significant tests reviewed: FEN0 01/24/2016= 13 with active symptoms on symb 160 2bid >changed to dulera 100  - Spirometry 02/23/2016 NO signifiant obstruction with active symptoms  - FENO 02/23/2016= 12 On dulera 100 2bid (unable to confirm adeherence as did not bring it - Singulair 10 mg daily added 10/25/2017to dulera 100 2bid with 2 week sample only (unable to verify she used it) - FENO 04/07/2016= 43 -CTa CHest 2017 >neg PE . 4 mm RUL nodule  CT sinus 2016 neg IgE 87 2017, positive Rast grass, dust, tree High-resolution CT chest April 2018--NEG  for ILD, stable 4 mm right upper lobe nodule. Echo 07/2017 EF 65%, Gr 2 DD , PAP 10mmHg  Spirometry 08/2017 Moderate restriction , no obstruction .     Allergies  Allergen Reactions  . Amlodipine Besylate Other (See Comments)    Reaction unknown  . Lisinopril Other (See  Comments)    Reaction unknown  . Pantoprazole Sodium Other (See Comments)    Reaction unknown    Immunization History  Administered Date(s) Administered  . Influenza Split 03/10/2012, 03/14/2015  . Influenza, High Dose Seasonal PF 01/29/2017  . Influenza-Unspecified 01/11/2016  . Zoster Recombinat (Shingrix) 08/28/2017, 10/09/2017    Past Medical History:  Diagnosis Date  . Anxiety   . Asthma   . Bronchitis   . Chronic diastolic CHF (congestive heart failure) ([place])   . Chronic diastolic CHF (congestive heart failure) ([place]) 07/17/2016  . Coronary artery disease    a. NSTEMI 12/2015 - LHC 01/18/16: s/p overlapping DESx2 to RCA, 10% ost-prox Cx,10% mLAD.  Marland Kitchen Depression   . Diabetes mellitus   . Dyslipidemia   . GERD (gastroesophageal reflux disease)   . Heart attack ([place])   . Hypertension   . Hypertensive heart disease   . NSTEMI (non-ST elevated myocardial infarction) ([place]) 12/2015  . Pneumonia     Tobacco History: Social History   Tobacco Use  Smoking Status Former Smoker  . Packs/day: 1.00  . Years: 30.00  . Pack years: 30.00  . Types: Cigarettes  . Last attempt to quit: 01/09/2004  . Years since quitting: 13.9  Smokeless Tobacco Never Used   Counseling given: Not Answered   Outpatient Medications Prior to Visit  Medication Sig Dispense Refill  . albuterol (PROAIR HFA) 108 (90 Base) MCG/ACT inhaler Inhale 2 puffs into the lungs every 4 (four) hours as needed  for wheezing or shortness of breath. 1 Inhaler 5  . albuterol (PROVENTIL) (2.5 MG/3ML) 0.083% nebulizer solution Take 3 mLs (2.5 mg total) by nebulization 3 (three) times daily. And every 4 hours as needed 500 mL 1  . ALPRAZolam (XANAX) 0.5 MG tablet Take 1 tablet (0.5 mg total) by mouth 2 (two) times daily as needed for anxiety. 30 tablet 0  . arformoterol (BROVANA) 15 MCG/2ML NEBU Take 2 mLs (15 mcg total) by nebulization 2 (two) times daily. 120 mL 3  . aspirin EC 81 MG tablet Take 81 mg by mouth daily.       Marland Kitchen atorvastatin (LIPITOR) 40 MG tablet Take 40 mg by mouth daily.    . Biotin 5000 MCG CAPS Take 1 capsule by mouth daily.    . budesonide (PULMICORT) 0.5 MG/2ML nebulizer solution Take 2 mLs (0.5 mg total) by nebulization 2 (two) times daily. 120 mL 12  . diltiazem (CARDIZEM CD) 240 MG 24 hr capsule Take 1 capsule (240 mg total) by mouth daily. 90 capsule 3  . divalproex (DEPAKOTE ER) 500 MG 24 hr tablet Take 2 tablets (1,000 mg total) by mouth at bedtime. 60 tablet 11  . esomeprazole (NEXIUM) 40 MG capsule Take 1 capsule (40 mg total) by mouth 2 (two) times daily before a meal. 60 capsule 3  . estradiol (ESTRACE) 1 MG tablet Take 1 mg by mouth daily.     . fluticasone (FLONASE) 50 MCG/ACT nasal spray Place 2 sprays into both nostrils daily. 16 g 2  . hydrALAZINE (APRESOLINE) 25 MG tablet Take 1 tablet (25 mg total) by mouth 2 (two) times daily. 180 tablet 3  . irbesartan (AVAPRO) 300 MG tablet Take 1 tablet (300 mg total) by mouth daily. Hold until follow-up with Dr Melvyn Novas    . metFORMIN (GLUCOPHAGE) 1000 MG tablet Take 1 tablet (1,000 mg total) by mouth 2 (two) times daily with a meal. 60 tablet 3  . montelukast (SINGULAIR) 10 MG tablet Take 1 tablet (10 mg total) by mouth daily. 30 tablet 3  . nebivolol (BYSTOLIC) 5 MG tablet Take 5 mg by mouth daily.    . nitroGLYCERIN (NITROSTAT) 0.4 MG SL tablet Place 1 tablet (0.4 mg total) under the tongue every 5 (five) minutes as needed for chest pain. x3 as needed 25 tablet 6  . potassium chloride SA (K-DUR,KLOR-CON) 20 MEQ tablet Take 20 mEq by mouth daily.     Marland Kitchen torsemide (DEMADEX) 20 MG tablet Take 20-40 mg by mouth daily. May take an extra tablet if needed.     No facility-administered medications prior to visit.     Review of Systems  Review of Systems  Constitutional: Negative.   HENT: Positive for congestion and postnasal drip. Negative for sinus pain.   Respiratory: Positive for cough, shortness of breath and wheezing.   Cardiovascular:  Negative.   Allergic/Immunologic: Positive for environmental allergies.    Physical Exam  BP 134/76 (BP Location: Right Arm, Cuff Size: Normal)   Pulse 83   Temp 97.8 F (36.6 C) (Oral)   Ht 5\' 2"  (1.575 m)   Wt 164 lb (74.4 kg)   LMP  (LMP Unknown)   SpO2 100%   BMI 30.00 kg/m  Physical Exam  Constitutional: She is oriented to person, place, and time. She appears well-developed and well-nourished.  HENT:  Head: Normocephalic and atraumatic.  Mouth/Throat: Uvula is midline and oropharynx is clear and moist. No posterior oropharyngeal edema.  Eyes: Pupils are equal, round, and reactive  to light. EOM are normal.  Neck: Normal range of motion. Neck supple.  Cardiovascular: Normal rate, regular rhythm and normal heart sounds.  No murmur heard. Pulmonary/Chest: Effort normal and breath sounds normal. No respiratory distress. She has no wheezes.  [place] t/o, moderate upper airway wheeze. Nor resp distress, Able to speak in full sentences.   Abdominal: Soft. Bowel sounds are normal. There is no tenderness.  Neurological: She is alert and oriented to person, place, and time.  Skin: Skin is warm and dry. No rash noted. No erythema.  Psychiatric: She has a normal mood and affect. Her behavior is normal. Judgment normal.     Lab Results:  CBC    Component Value Date/Time   WBC 7.9 11/16/2017 1234   RBC 3.50 (L) 11/16/2017 1234   HGB 11.4 (L) 11/16/2017 1234   HGB 11.2 09/17/2017 1550   HCT 33.7 (L) 11/16/2017 1234   HCT 34.2 09/17/2017 1550   PLT 307.0 11/16/2017 1234   PLT 338 09/17/2017 1550   MCV 96.3 11/16/2017 1234   MCV 95 09/17/2017 1550   MCH 31.1 10/09/2017 0731   MCHC 33.7 11/16/2017 1234   RDW 14.5 11/16/2017 1234   RDW 14.4 09/17/2017 1550   LYMPHSABS 2.2 11/16/2017 1234   LYMPHSABS 2.2 05/17/2017 1141   MONOABS 0.6 11/16/2017 1234   EOSABS 0.0 11/16/2017 1234   EOSABS 0.1 05/17/2017 1141   BASOSABS 0.1 11/16/2017 1234   BASOSABS 0.1 05/17/2017 1141     BMET    Component Value Date/Time   NA 135 10/16/2017 0311   NA 142 09/17/2017 1550   K 4.4 10/16/2017 0311   CL 96 (L) 10/16/2017 0311   CO2 31 10/16/2017 0311   GLUCOSE 256 (H) 10/16/2017 0311   BUN 27 (H) 10/16/2017 0311   BUN 28 (H) 09/17/2017 1550   CREATININE 1.13 (H) 10/16/2017 0311   CREATININE 1.06 (H) 01/26/2016 1608   CALCIUM 9.4 10/16/2017 0311   GFRNONAA 49 (L) 10/16/2017 0311   GFRAA 57 (L) 10/16/2017 0311    BNP    Component Value Date/Time   BNP 30.0 10/05/2017 1625   BNP <4.0 01/26/2016 1608    ProBNP    Component Value Date/Time   PROBNP 13.0 02/23/2016 1122    Imaging: No results found.   Assessment & Plan:   Upper airway cough syndrome vs Asthma  - Asthma exac with upperairway component - VSS, O2 100%. RA. [place] t/o. FENO 18 - Tx prednisone taper x 6 days - Add neurontin 100mg  TID and tramadol 50mg  q 8hrs prn cough  - FU in 2-4 weeks with Dr. Melvyn Novas - If symptoms or SOB worsen and unable to speak in full sentences, instructed patient to present in ED   Asthma exacerbation Tx prednisone x 6 days Continue Brovana and budesonide Back up Albuterol q4hrs   Allergic rhinitis Symptoms not consistent with bacterial sinusitis, likely flared by outdoor activities   IgE 96, normal EOS on 07/19  RAST pos grass, dust, tree  Add zyrtec Continue Singulair Nasal irrigation     Martyn Ehrich, NP 12/17/2017

## 2017-12-17 NOTE — Assessment & Plan Note (Signed)
Tx prednisone x 6 days Continue Brovana and budesonide Back up Albuterol q4hrs

## 2017-12-17 NOTE — Patient Instructions (Addendum)
Nasal congestion: Add zyrtec - daily antihistamine Add nasal irrigation  Continue Flonase nasal spray  Cough: Add Gabapentin 100mg  x3 day Tramadol 50mg  q8 hrs as needed for cough   Wheeze: Start prednisone taper x 6 days  Continue Budesonide and Brovana inhaler treatments as prescribed Back up  Albuterol inh/neb q4hrs as needed for wheezing   FU with Dr. Melvyn Novas please in 2-4 weeks

## 2017-12-17 NOTE — Assessment & Plan Note (Addendum)
-   Asthma exac with upperairway component - VSS, O2 100%. RA. [place] t/o. FENO 18 - Tx prednisone taper x 6 days - Add neurontin 100mg  TID and tramadol 50mg  q 8hrs prn cough  - FU in 2-4 weeks with Dr. Melvyn Novas - If symptoms or SOB worsen and unable to speak in full sentences, instructed patient to present in ED

## 2017-12-17 NOTE — Assessment & Plan Note (Addendum)
Symptoms not consistent with bacterial sinusitis, likely flared by outdoor activities   IgE 96, normal EOS on 07/19  RAST pos grass, dust, tree  Add zyrtec Continue Singulair Nasal irrigation

## 2017-12-17 NOTE — Telephone Encounter (Signed)
Called [facility] and spoke with pharmacist Threasa Beards who is requesting diagnosis code for the tramadol Rx given at visit today with [facility] NP  Diagnosis from visit provided >> cough  Nothing further needed; will sign off

## 2017-12-18 ENCOUNTER — Telehealth: Payer: Self-pay | Admitting: Primary Care

## 2017-12-18 MED ORDER — HYDROCODONE-HOMATROPINE 5-1.5 MG/5ML PO SYRP: 5.0000 mL | ORAL_SOLUTION | Freq: Four times a day (QID) | ORAL | 0 refills | Status: DC | PRN

## 2017-12-18 NOTE — Telephone Encounter (Signed)
RN Case Freight forwarder from [employer] [phone] ext. [phone]) faxing over clinical information that the patient is reporting; symptoms and medication request;

## 2017-12-18 NOTE — Telephone Encounter (Signed)
That's fine I can send in. I saw she last got prescription cough medication in June from another provider, please just remind patient not to use multiple providers. Also no driving when taking medication. Thanks

## 2017-12-18 NOTE — Telephone Encounter (Signed)
Rx has been printed and signed by [employer]. Pt is aware. Rx has been placed up front for pick up. Nothing further was needed.

## 2017-12-18 NOTE — Telephone Encounter (Signed)
Spoke with pt. States that since seeing Beth yesterday, her cough has gotten worse. While speaking with the pt she was forcing herself to wheeze. Cough is non productive. Pt is requesting cough syrup.  Beth - please advise. Thanks.

## 2017-12-19 ENCOUNTER — Inpatient Hospital Stay (HOSPITAL_COMMUNITY)
Admission: EM | Admit: 2017-12-19 | Discharge: 2017-12-24 | DRG: 190 | Disposition: A | Discharge status: Home / Self Care | Payer: Medicare Other | Attending: Internal Medicine | Admitting: Internal Medicine

## 2017-12-19 ENCOUNTER — Encounter (HOSPITAL_COMMUNITY): Payer: Self-pay | Admitting: *Deleted

## 2017-12-19 ENCOUNTER — Emergency Department (HOSPITAL_COMMUNITY): Payer: Medicare Other

## 2017-12-19 ENCOUNTER — Other Ambulatory Visit: Payer: Self-pay

## 2017-12-19 ENCOUNTER — Telehealth: Payer: Self-pay | Admitting: Cardiology

## 2017-12-19 ENCOUNTER — Telehealth: Payer: Self-pay | Admitting: Internal Medicine

## 2017-12-19 DIAGNOSIS — F418 Other specified anxiety disorders: Secondary | ICD-10-CM | POA: Diagnosis present

## 2017-12-19 DIAGNOSIS — I10 Essential (primary) hypertension: Secondary | ICD-10-CM | POA: Diagnosis present

## 2017-12-19 DIAGNOSIS — Z955 Presence of coronary angioplasty implant and graft: Secondary | ICD-10-CM

## 2017-12-19 DIAGNOSIS — N179 Acute kidney failure, unspecified: Secondary | ICD-10-CM | POA: Diagnosis not present

## 2017-12-19 DIAGNOSIS — Z7952 Long term (current) use of systemic steroids: Secondary | ICD-10-CM

## 2017-12-19 DIAGNOSIS — J4531 Mild persistent asthma with (acute) exacerbation: Secondary | ICD-10-CM | POA: Diagnosis present

## 2017-12-19 DIAGNOSIS — I130 Hypertensive heart and chronic kidney disease with heart failure and stage 1 through stage 4 chronic kidney disease, or unspecified chronic kidney disease: Secondary | ICD-10-CM | POA: Diagnosis present

## 2017-12-19 DIAGNOSIS — J441 Chronic obstructive pulmonary disease with (acute) exacerbation: Secondary | ICD-10-CM | POA: Diagnosis not present

## 2017-12-19 DIAGNOSIS — Z825 Family history of asthma and other chronic lower respiratory diseases: Secondary | ICD-10-CM

## 2017-12-19 DIAGNOSIS — I5033 Acute on chronic diastolic (congestive) heart failure: Secondary | ICD-10-CM | POA: Diagnosis present

## 2017-12-19 DIAGNOSIS — Z79899 Other long term (current) drug therapy: Secondary | ICD-10-CM

## 2017-12-19 DIAGNOSIS — J45901 Unspecified asthma with (acute) exacerbation: Secondary | ICD-10-CM | POA: Diagnosis not present

## 2017-12-19 DIAGNOSIS — Z7982 Long term (current) use of aspirin: Secondary | ICD-10-CM

## 2017-12-19 DIAGNOSIS — E119 Type 2 diabetes mellitus without complications: Secondary | ICD-10-CM

## 2017-12-19 DIAGNOSIS — Z79891 Long term (current) use of opiate analgesic: Secondary | ICD-10-CM

## 2017-12-19 DIAGNOSIS — J309 Allergic rhinitis, unspecified: Secondary | ICD-10-CM | POA: Diagnosis present

## 2017-12-19 DIAGNOSIS — N1832 Chronic kidney disease, stage 3b: Secondary | ICD-10-CM | POA: Diagnosis present

## 2017-12-19 DIAGNOSIS — I2510 Atherosclerotic heart disease of native coronary artery without angina pectoris: Secondary | ICD-10-CM | POA: Diagnosis present

## 2017-12-19 DIAGNOSIS — I5032 Chronic diastolic (congestive) heart failure: Secondary | ICD-10-CM

## 2017-12-19 DIAGNOSIS — Z7984 Long term (current) use of oral hypoglycemic drugs: Secondary | ICD-10-CM

## 2017-12-19 DIAGNOSIS — J9601 Acute respiratory failure with hypoxia: Secondary | ICD-10-CM | POA: Diagnosis not present

## 2017-12-19 DIAGNOSIS — R05 Cough: Secondary | ICD-10-CM

## 2017-12-19 DIAGNOSIS — F329 Major depressive disorder, single episode, unspecified: Secondary | ICD-10-CM | POA: Diagnosis present

## 2017-12-19 DIAGNOSIS — Z833 Family history of diabetes mellitus: Secondary | ICD-10-CM

## 2017-12-19 DIAGNOSIS — E1165 Type 2 diabetes mellitus with hyperglycemia: Secondary | ICD-10-CM | POA: Diagnosis not present

## 2017-12-19 DIAGNOSIS — N183 Chronic kidney disease, stage 3 (moderate): Secondary | ICD-10-CM | POA: Diagnosis present

## 2017-12-19 DIAGNOSIS — E1122 Type 2 diabetes mellitus with diabetic chronic kidney disease: Secondary | ICD-10-CM | POA: Diagnosis present

## 2017-12-19 DIAGNOSIS — Z90710 Acquired absence of both cervix and uterus: Secondary | ICD-10-CM

## 2017-12-19 DIAGNOSIS — F419 Anxiety disorder, unspecified: Secondary | ICD-10-CM | POA: Diagnosis present

## 2017-12-19 DIAGNOSIS — J209 Acute bronchitis, unspecified: Secondary | ICD-10-CM | POA: Diagnosis present

## 2017-12-19 DIAGNOSIS — E1142 Type 2 diabetes mellitus with diabetic polyneuropathy: Secondary | ICD-10-CM | POA: Diagnosis present

## 2017-12-19 DIAGNOSIS — J9811 Atelectasis: Secondary | ICD-10-CM | POA: Diagnosis present

## 2017-12-19 DIAGNOSIS — E785 Hyperlipidemia, unspecified: Secondary | ICD-10-CM | POA: Diagnosis present

## 2017-12-19 DIAGNOSIS — Z86711 Personal history of pulmonary embolism: Secondary | ICD-10-CM

## 2017-12-19 DIAGNOSIS — Z87891 Personal history of nicotine dependence: Secondary | ICD-10-CM

## 2017-12-19 DIAGNOSIS — K219 Gastro-esophageal reflux disease without esophagitis: Secondary | ICD-10-CM | POA: Diagnosis present

## 2017-12-19 DIAGNOSIS — T380X5A Adverse effect of glucocorticoids and synthetic analogues, initial encounter: Secondary | ICD-10-CM | POA: Diagnosis not present

## 2017-12-19 DIAGNOSIS — I252 Old myocardial infarction: Secondary | ICD-10-CM

## 2017-12-19 DIAGNOSIS — Z8249 Family history of ischemic heart disease and other diseases of the circulatory system: Secondary | ICD-10-CM

## 2017-12-19 DIAGNOSIS — Z7951 Long term (current) use of inhaled steroids: Secondary | ICD-10-CM

## 2017-12-19 DIAGNOSIS — R058 Other specified cough: Secondary | ICD-10-CM | POA: Diagnosis present

## 2017-12-19 DIAGNOSIS — Z888 Allergy status to other drugs, medicaments and biological substances status: Secondary | ICD-10-CM

## 2017-12-19 LAB — BASIC METABOLIC PANEL
Anion gap: 15 (ref 5–15)
BUN: 44 mg/dL — AB (ref 8–23)
CALCIUM: 8.1 mg/dL — AB (ref 8.9–10.3)
CO2: 23 mmol/L (ref 22–32)
CREATININE: 2.01 mg/dL — AB (ref 0.44–1.00)
Chloride: 97 mmol/L — ABNORMAL LOW (ref 98–111)
GFR calc non Af Amer: 24 mL/min — ABNORMAL LOW (ref >60)
GFR, EST AFRICAN AMERICAN: 28 mL/min — AB (ref >60)
Glucose, Bld: 216 mg/dL — ABNORMAL HIGH (ref 70–99)
Potassium: 4.5 mmol/L (ref 3.5–5.1)
SODIUM: 135 mmol/L (ref 135–145)

## 2017-12-19 LAB — CBC WITH DIFFERENTIAL/PLATELET
Abs Immature Granulocytes: 0.1 10*3/uL (ref 0.0–0.1)
Basophils Absolute: 0 10*3/uL (ref 0.0–0.1)
Basophils Relative: 0 %
EOS PCT: 0 %
Eosinophils Absolute: 0 10*3/uL (ref 0.0–0.7)
HCT: 34.3 % — ABNORMAL LOW (ref 36.0–46.0)
Hemoglobin: 10.9 g/dL — ABNORMAL LOW (ref 12.0–15.0)
Immature Granulocytes: 1 %
Lymphocytes Relative: 14 %
Lymphs Abs: 1.8 10*3/uL (ref 0.7–4.0)
MCH: 31.5 pg (ref 26.0–34.0)
MCHC: 31.8 g/dL (ref 30.0–36.0)
MCV: 99.1 fL (ref 78.0–100.0)
Monocytes Absolute: 0.5 10*3/uL (ref 0.1–1.0)
Monocytes Relative: 4 %
Neutro Abs: 10.7 10*3/uL — ABNORMAL HIGH (ref 1.7–7.7)
Neutrophils Relative %: 81 %
Platelets: 309 10*3/uL (ref 150–400)
RBC: 3.46 MIL/uL — AB (ref 3.87–5.11)
RDW: 13.2 % (ref 11.5–15.5)
WBC: 13.1 10*3/uL — AB (ref 4.0–10.5)

## 2017-12-19 LAB — I-STAT VENOUS BLOOD GAS, ED
Acid-base deficit: 1 mmol/L (ref 0.0–2.0)
BICARBONATE: 25.4 mmol/L (ref 20.0–28.0)
O2 SAT: 86 %
TCO2: 27 mmol/L (ref 22–32)
pCO2, Ven: 46.5 mmHg (ref 44.0–60.0)
pH, Ven: 7.346 (ref 7.250–7.430)
pO2, Ven: 55 mmHg — ABNORMAL HIGH (ref 32.0–45.0)

## 2017-12-19 LAB — I-STAT TROPONIN, ED: Troponin i, poc: 0 ng/mL (ref 0.00–0.08)

## 2017-12-19 MED ORDER — HYDROCODONE-HOMATROPINE 5-1.5 MG/5ML PO SYRP
5.0000 mL | ORAL_SOLUTION | Freq: Four times a day (QID) | ORAL | Status: DC | PRN
  Administered 2017-12-23 – 2017-12-24 (×2): 5 mL via ORAL
  Filled 2017-12-19 (×2): qty 5

## 2017-12-19 MED ORDER — ENOXAPARIN SODIUM 30 MG/0.3ML ~~LOC~~ SOLN
30.0000 mg | Freq: Every day | SUBCUTANEOUS | Status: DC
  Administered 2017-12-20 – 2017-12-23 (×5): 30 mg via SUBCUTANEOUS
  Filled 2017-12-19 (×6): qty 0.3

## 2017-12-19 MED ORDER — MONTELUKAST SODIUM 10 MG PO TABS
10.0000 mg | ORAL_TABLET | Freq: Every day | ORAL | Status: DC
  Administered 2017-12-20 – 2017-12-24 (×5): 10 mg via ORAL
  Filled 2017-12-19 (×5): qty 1

## 2017-12-19 MED ORDER — ARFORMOTEROL TARTRATE 15 MCG/2ML IN NEBU: 15.0000 ug | INHALATION_SOLUTION | Freq: Two times a day (BID) | RESPIRATORY_TRACT | 3 refills | Status: DC

## 2017-12-19 MED ORDER — ENOXAPARIN SODIUM 40 MG/0.4ML ~~LOC~~ SOLN
40.0000 mg | Freq: Every day | SUBCUTANEOUS | Status: DC
  Filled 2017-12-19: qty 0.4

## 2017-12-19 MED ORDER — DILTIAZEM HCL ER COATED BEADS 240 MG PO CP24
240.0000 mg | ORAL_CAPSULE | Freq: Every day | ORAL | Status: DC
  Administered 2017-12-20 – 2017-12-24 (×6): 240 mg via ORAL
  Filled 2017-12-19 (×5): qty 1

## 2017-12-19 MED ORDER — ALPRAZOLAM 0.5 MG PO TABS
0.5000 mg | ORAL_TABLET | Freq: Two times a day (BID) | ORAL | Status: DC | PRN
  Administered 2017-12-21 – 2017-12-24 (×5): 0.5 mg via ORAL
  Filled 2017-12-19 (×5): qty 1

## 2017-12-19 MED ORDER — ASPIRIN EC 81 MG PO TBEC
81.0000 mg | DELAYED_RELEASE_TABLET | Freq: Every day | ORAL | Status: DC
  Administered 2017-12-20 – 2017-12-24 (×5): 81 mg via ORAL
  Filled 2017-12-19 (×5): qty 1

## 2017-12-19 MED ORDER — ESTRADIOL 1 MG PO TABS
1.0000 mg | ORAL_TABLET | Freq: Every day | ORAL | Status: DC
  Administered 2017-12-20 – 2017-12-24 (×5): 1 mg via ORAL
  Filled 2017-12-19 (×5): qty 1

## 2017-12-19 MED ORDER — BUDESONIDE 0.5 MG/2ML IN SUSP
0.5000 mg | Freq: Two times a day (BID) | RESPIRATORY_TRACT | Status: DC
  Administered 2017-12-20 – 2017-12-24 (×9): 0.5 mg via RESPIRATORY_TRACT
  Filled 2017-12-19 (×10): qty 2

## 2017-12-19 MED ORDER — NEBIVOLOL HCL 5 MG PO TABS
5.0000 mg | ORAL_TABLET | Freq: Every day | ORAL | Status: DC
  Administered 2017-12-20 – 2017-12-24 (×5): 5 mg via ORAL
  Filled 2017-12-19 (×5): qty 1

## 2017-12-19 MED ORDER — ACETAMINOPHEN 650 MG RE SUPP: 650.0000 mg | Freq: Four times a day (QID) | RECTAL | Status: DC | PRN

## 2017-12-19 MED ORDER — ALBUTEROL SULFATE (2.5 MG/3ML) 0.083% IN NEBU
3.0000 mL | INHALATION_SOLUTION | RESPIRATORY_TRACT | Status: DC | PRN
  Administered 2017-12-22: 3 mL via RESPIRATORY_TRACT
  Filled 2017-12-19 (×2): qty 3

## 2017-12-19 MED ORDER — MAGNESIUM SULFATE 2 GM/50ML IV SOLN
2.0000 g | Freq: Once | INTRAVENOUS | Status: AC
  Administered 2017-12-19: 2 g via INTRAVENOUS

## 2017-12-19 MED ORDER — FLUTICASONE PROPIONATE 50 MCG/ACT NA SUSP
2.0000 | Freq: Every day | NASAL | Status: DC
  Administered 2017-12-21 – 2017-12-24 (×4): 2 via NASAL
  Filled 2017-12-19: qty 16

## 2017-12-19 MED ORDER — ALBUTEROL SULFATE (2.5 MG/3ML) 0.083% IN NEBU
2.5000 mg | INHALATION_SOLUTION | Freq: Three times a day (TID) | RESPIRATORY_TRACT | Status: DC
  Administered 2017-12-20 (×2): 2.5 mg via RESPIRATORY_TRACT
  Filled 2017-12-19 (×2): qty 3

## 2017-12-19 MED ORDER — ARFORMOTEROL TARTRATE 15 MCG/2ML IN NEBU
15.0000 ug | INHALATION_SOLUTION | Freq: Two times a day (BID) | RESPIRATORY_TRACT | Status: DC
  Administered 2017-12-20 – 2017-12-24 (×9): 15 ug via RESPIRATORY_TRACT
  Filled 2017-12-19 (×10): qty 2

## 2017-12-19 MED ORDER — ACETAMINOPHEN 325 MG PO TABS
650.0000 mg | ORAL_TABLET | Freq: Four times a day (QID) | ORAL | Status: DC | PRN
  Filled 2017-12-19: qty 2

## 2017-12-19 MED ORDER — METHYLPREDNISOLONE SODIUM SUCC 125 MG IJ SOLR
60.0000 mg | Freq: Two times a day (BID) | INTRAMUSCULAR | Status: DC
  Administered 2017-12-20 – 2017-12-21 (×3): 60 mg via INTRAVENOUS
  Filled 2017-12-19 (×3): qty 2

## 2017-12-19 MED ORDER — DIVALPROEX SODIUM ER 500 MG PO TB24
1000.0000 mg | ORAL_TABLET | Freq: Every day | ORAL | Status: DC
  Administered 2017-12-20 – 2017-12-23 (×4): 1000 mg via ORAL
  Filled 2017-12-19 (×5): qty 2

## 2017-12-19 MED ORDER — GABAPENTIN 100 MG PO CAPS
100.0000 mg | ORAL_CAPSULE | Freq: Three times a day (TID) | ORAL | Status: DC
  Administered 2017-12-20 – 2017-12-24 (×13): 100 mg via ORAL
  Filled 2017-12-19 (×14): qty 1

## 2017-12-19 MED ORDER — ONDANSETRON HCL 4 MG PO TABS: 4.0000 mg | ORAL_TABLET | Freq: Four times a day (QID) | ORAL | Status: DC | PRN

## 2017-12-19 MED ORDER — TRAMADOL HCL 50 MG PO TABS: 50.0000 mg | ORAL_TABLET | Freq: Two times a day (BID) | ORAL | Status: DC | PRN

## 2017-12-19 MED ORDER — ONDANSETRON HCL 4 MG/2ML IJ SOLN: 4.0000 mg | Freq: Four times a day (QID) | INTRAMUSCULAR | Status: DC | PRN

## 2017-12-19 MED ORDER — INSULIN ASPART 100 UNIT/ML ~~LOC~~ SOLN
0.0000 [IU] | Freq: Three times a day (TID) | SUBCUTANEOUS | Status: DC
  Administered 2017-12-20 (×2): 3 [IU] via SUBCUTANEOUS
  Administered 2017-12-20: 5 [IU] via SUBCUTANEOUS
  Administered 2017-12-21: 3 [IU] via SUBCUTANEOUS
  Administered 2017-12-21 (×2): 5 [IU] via SUBCUTANEOUS
  Administered 2017-12-22: 9 [IU] via SUBCUTANEOUS
  Administered 2017-12-22 (×2): 5 [IU] via SUBCUTANEOUS

## 2017-12-19 MED ORDER — IPRATROPIUM-ALBUTEROL 0.5-2.5 (3) MG/3ML IN SOLN
3.0000 mL | Freq: Once | RESPIRATORY_TRACT | Status: AC
  Administered 2017-12-19: 3 mL via RESPIRATORY_TRACT
  Filled 2017-12-19: qty 3

## 2017-12-19 MED ORDER — HYDRALAZINE HCL 25 MG PO TABS
25.0000 mg | ORAL_TABLET | Freq: Two times a day (BID) | ORAL | Status: DC
  Administered 2017-12-20 – 2017-12-24 (×9): 25 mg via ORAL
  Filled 2017-12-19 (×9): qty 1

## 2017-12-19 MED ORDER — PANTOPRAZOLE SODIUM 40 MG PO TBEC
80.0000 mg | DELAYED_RELEASE_TABLET | Freq: Every day | ORAL | Status: DC
  Administered 2017-12-20 – 2017-12-24 (×5): 80 mg via ORAL
  Filled 2017-12-19 (×5): qty 2

## 2017-12-19 MED ORDER — ALBUTEROL (5 MG/ML) CONTINUOUS INHALATION SOLN
10.0000 mg/h | INHALATION_SOLUTION | Freq: Once | RESPIRATORY_TRACT | Status: AC
  Administered 2017-12-19: 10 mg/h via RESPIRATORY_TRACT
  Filled 2017-12-19: qty 20

## 2017-12-19 MED ORDER — TRAMADOL HCL 50 MG PO TABS: 50.0000 mg | ORAL_TABLET | Freq: Four times a day (QID) | ORAL | Status: DC | PRN

## 2017-12-19 MED ORDER — ATORVASTATIN CALCIUM 40 MG PO TABS
40.0000 mg | ORAL_TABLET | Freq: Every day | ORAL | Status: DC
  Administered 2017-12-20 – 2017-12-24 (×5): 40 mg via ORAL
  Filled 2017-12-19 (×5): qty 1

## 2017-12-19 NOTE — Telephone Encounter (Signed)
Ok, I sent the Rx to [facility]. We can call in the am to see if they received it and inquire about the price. Will await conformation that they received this. Will leave encounter open until tomorrow.

## 2017-12-19 NOTE — ED Provider Notes (Signed)
EMERGENCY DEPARTMENT Provider Note   CSN: [id number] Arrival date & time: 12/19/17  1738     History   Chief Complaint Chief Complaint  Patient presents with  . Respiratory Distress    HPI [name] is a 67 y.o. female is a 67 year old female with a history of an NSTEMI, diabetes, asthma, and CHF who presents via EMS due to respiratory distress.  She says that she has felt short of breath since Monday.  EMS reported that she was wheezing and was very tight.  They gave albuterol and Atrovent as well as Solu-Medrol in route.  They also started her on CPAP.  Currently, she says this feels like her typical asthma exacerbation.  Her shortness of breath is severe. She denies chest pain.  She reports having productive cough since Monday but also denies fever.  HPI  Past Medical History:  Diagnosis Date  . Anxiety   . Asthma   . Bronchitis   . Chronic diastolic CHF (congestive heart failure) ([place])   . Chronic diastolic CHF (congestive heart failure) ([place]) 07/17/2016  . Coronary artery disease    a. NSTEMI 12/2015 - [facility] 01/18/16: s/p overlapping DESx2 to RCA, 10% ost-prox Cx,10% mLAD.  Marland Kitchen Depression   . Diabetes mellitus   . Dyslipidemia   . GERD (gastroesophageal reflux disease)   . Heart attack ([place])   . Hypertension   . Hypertensive heart disease   . NSTEMI (non-ST elevated myocardial infarction) ([place]) 12/2015  . Pneumonia     Patient Active Problem List   Diagnosis Date Noted  . Dyspnea 08/12/2017  . Macrocytic anemia 08/12/2017  . Therapeutic drug monitoring 05/17/2017  . Lung nodule 08/01/2016  . Influenza B 07/18/2016  . Chronic diastolic CHF (congestive heart failure) ([place]) 07/17/2016  . Acute renal failure superimposed on stage 3 chronic kidney disease ([place]) 07/17/2016  . Right leg swelling 02/27/2016  . Acute respiratory failure with hypoxia ([place])   . Uncontrolled type 2 diabetes mellitus with complication ([place])   . Acute pulmonary  embolism ([place])   . HLD (hyperlipidemia)   . Seizures ([place]) 01/27/2016  . Numbness 01/27/2016  . CAD in native artery 01/20/2016  . Hypertensive heart disease 01/20/2016  . NSTEMI (non-ST elevated myocardial infarction) ([place]) 01/18/2016  . Acute bronchitis with asthma with acute exacerbation 01/16/2016  . Hypokalemia 01/16/2016  . Hypomagnesemia 01/16/2016  . Acute respiratory failure ([place]) 01/16/2016  . Dyspnea on exertion   . Asthma exacerbation 07/21/2015  . Upper airway cough syndrome vs Asthma  08/28/2014  . Diabetes mellitus type 2, controlled ([place]) 08/06/2014  . Hyperlipidemia 08/06/2014  . Allergic rhinitis 07/22/2008  . 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 DYSPNEA 07/22/2008  . Diabetes mellitus type 2, uncontrolled ([place]) 07/21/2008  . DIABETIC PERIPHERAL NEUROPATHY 07/21/2008  . Depression with anxiety 07/21/2008  . Essential hypertension 07/21/2008  . Mild persistent chronic asthma without complication 45/99/7741  . G E R D 07/21/2008  . PULMONARY EMBOLISM, HX OF 07/21/2008    Past Surgical History:  Procedure Laterality Date  . ABDOMINAL HYSTERECTOMY    . CARDIAC CATHETERIZATION  1994   minimal LAD dz, no other dz, EF normal  . CARDIAC CATHETERIZATION N/A 01/18/2016   Procedure: Left Heart Cath and Coronary Angiography;  Surgeon: Burnell Blanks, MD;  Location: [facility];  Service: Cardiovascular;  Laterality: N/A;  . CARDIAC CATHETERIZATION N/A 01/18/2016   Procedure: Coronary Stent Intervention;  Surgeon: Burnell Blanks, MD;  Location: [facility];  Service: Cardiovascular;  Laterality: N/A;  . COLONOSCOPY WITH PROPOFOL N/A 04/09/2014   Procedure: COLONOSCOPY WITH PROPOFOL;  Surgeon: Cleotis Nipper, MD;  Location: [facility];  Service: Endoscopy;  Laterality: N/A;  . CORONARY STENT PLACEMENT  01/19/2016   STENT SYNERGY DES 0.16W10 drug eluting stent was successfully placed, and overlaps the 2.5 x 38 mm Synergy stent placed  distally.  . ESOPHAGOGASTRODUODENOSCOPY (EGD) WITH PROPOFOL N/A 04/09/2014   Procedure: ESOPHAGOGASTRODUODENOSCOPY (EGD) WITH PROPOFOL;  Surgeon: Cleotis Nipper, MD;  Location: [facility];  Service: Endoscopy;  Laterality: N/A;  . HEMORRHOID SURGERY    . HERNIA REPAIR    . KNEE ARTHROSCOPY       OB History   None      Home Medications    Prior to Admission medications   Medication Sig Start Date End Date Taking? Authorizing Provider  albuterol (PROAIR HFA) 108 (90 Base) MCG/ACT inhaler Inhale 2 puffs into the lungs every 4 (four) hours as needed for wheezing or shortness of breath. 06/29/17  Yes Hayden Rasmussen, MD  albuterol (PROVENTIL) (2.5 MG/3ML) 0.083% nebulizer solution Take 3 mLs (2.5 mg total) by nebulization 3 (three) times daily. And every 4 hours as needed Patient taking differently: Take 2.5 mg by nebulization every 4 (four) hours as needed for wheezing. And every 4 hours as needed 10/18/17  Yes Rai, Ripudeep K, MD  ALPRAZolam (XANAX) 0.5 MG tablet Take 1 tablet (0.5 mg total) by mouth 2 (two) times daily as needed for anxiety. 07/31/15  Yes Theodis Blaze, MD  arformoterol (BROVANA) 15 MCG/2ML NEBU Take 2 mLs (15 mcg total) by nebulization 2 (two) times daily. 12/19/17  Yes Martyn Ehrich, NP  aspirin EC 81 MG tablet Take 81 mg by mouth daily.    Yes [provider]  atorvastatin (LIPITOR) 40 MG tablet Take 40 mg by mouth daily.   Yes [provider]  Biotin 5000 MCG CAPS Take 1 capsule by mouth daily.   Yes [provider]  budesonide (PULMICORT) 0.5 MG/2ML nebulizer solution Take 2 mLs (0.5 mg total) by nebulization 2 (two) times daily. 10/18/17  Yes Rai, Ripudeep K, MD  diltiazem (CARDIZEM CD) 240 MG 24 hr capsule Take 1 capsule (240 mg total) by mouth daily. 09/17/17  Yes Burtis Junes, NP  divalproex (DEPAKOTE ER) 500 MG 24 hr tablet Take 2 tablets (1,000 mg total) by mouth at bedtime. 05/29/17  Yes Dennie Bible, NP  esomeprazole  (NEXIUM) 40 MG capsule Take 1 capsule (40 mg total) by mouth 2 (two) times daily before a meal. 10/18/17  Yes Rai, Ripudeep K, MD  estradiol (ESTRACE) 1 MG tablet Take 1 mg by mouth daily.    Yes [provider]  fluticasone (FLONASE) 50 MCG/ACT nasal spray Place 2 sprays into both nostrils daily. 10/19/17  Yes Rai, Ripudeep K, MD  gabapentin (NEURONTIN) 100 MG capsule Take 1 capsule (100 mg total) by mouth 3 (three) times daily. 12/17/17  Yes Martyn Ehrich, NP  hydrALAZINE (APRESOLINE) 25 MG tablet Take 1 tablet (25 mg total) by mouth 2 (two) times daily. 09/17/17  Yes Burtis Junes, NP  HYDROcodone-homatropine (HYCODAN) 5-1.5 MG/5ML syrup Take 5 mLs by mouth every 6 (six) hours as needed for cough. 12/18/17  Yes Martyn Ehrich, NP  metFORMIN (GLUCOPHAGE) 1000 MG tablet Take 1 tablet (1,000 mg total) by mouth 2 (two) times daily with a meal. 10/18/17  Yes Rai, Ripudeep K,  MD  montelukast (SINGULAIR) 10 MG tablet Take 1 tablet (10 mg total) by mouth daily. 10/19/17  Yes Rai, Ripudeep K, MD  nebivolol (BYSTOLIC) 5 MG tablet Take 5 mg by mouth daily.   Yes [provider]  nitroGLYCERIN (NITROSTAT) 0.4 MG SL tablet Place 1 tablet (0.4 mg total) under the tongue every 5 (five) minutes as needed for chest pain. x3 as needed 09/17/17  Yes Gerhardt, Marlane Hatcher, NP  potassium chloride SA (K-DUR,KLOR-CON) 20 MEQ tablet Take 20 mEq by mouth daily.    Yes [provider]  torsemide (DEMADEX) 20 MG tablet Take 20 mg by mouth daily.  10/25/16  Yes [provider]  traMADol (ULTRAM) 50 MG tablet Take 1 tablet (50 mg total) by mouth every 6 (six) hours as needed for up to 7 days. 12/17/17 12/24/17 Yes Martyn Ehrich, NP    Family History Family History  Problem Relation Age of Onset  . Allergies Mother   . Asthma Mother   . CAD Father 65  . Diabetes Father   . Hypertension Father   . Congestive Heart Failure Sister        died in her 59s  . CAD Sister 22    Social  History Social History   Tobacco Use  . Smoking status: Former Smoker    Packs/day: 1.00    Years: 30.00    Pack years: 30.00    Types: Cigarettes    Last attempt to quit: 01/09/2004    Years since quitting: 13.9  . Smokeless tobacco: Never Used  Substance Use Topics  . Alcohol use: No    Alcohol/week: 0.0 standard drinks  . Drug use: No     Allergies   Amlodipine besylate; Lisinopril; and Pantoprazole sodium   Review of Systems Review of Systems Review of Systems   Constitutional  Negative for fever  Negative for chills  HENT  Negative for ear pain  Negative for sore throat  Negative for difficultly swallowing  Eyes  Negative for eye pain  Negative for visual disturbance  Respiratory  +for shortness of breath  +for cough  CV  Negative for chest pain  Negative for leg swelling  Abdomen  Negative for abdominal pain  Negative for nausea  Negative for vomiting  MSK  Negative for extremity pain  Negative for back pain  Skin  Negative for rash  Negative for wound  Neuro  Negative for syncope  Negative for difficultly speaking  Psych  Negative for confusion   The remainder of the ROS was reviewed and negative except as documented above.      Physical Exam Updated Vital Signs BP (!) 159/83   Pulse 83   Resp 11   Ht 5\' 2"  (1.575 m)   Wt 74.4 kg   LMP  (LMP Unknown)   SpO2 100%   BMI 30.00 kg/m   Physical Exam Physical Exam Constitutional  Nursing notes reviewed  Vital signs reviewed  HEENT  No obvious trauma  Supple without meningismus, mass, or overt JVD  EOMI  No scleral icterus or injection  Respiratory  In respiratory distress  Tachypneic  Diffuse wheezes  No stridor  Mild intercostal retractions  CV  Normal rate  No obvious murmurs  No pitting edema  Abdomen  Soft  Non-tender  Non-distended  No peritonitis  MSK  Atraumatic  No obvious deformity  ROM appropriate  Skin  Warm  Dry  Neuro   Awake and alert  EOMI  Moving all  extremities  Psychiatric  Mood and affect normal        ED Treatments / Results  Labs (all labs ordered are listed, but only abnormal results are displayed) Labs Reviewed  CBC WITH DIFFERENTIAL/PLATELET - Abnormal; Notable for the following components:      Result Value   WBC 13.1 (*)    RBC 3.46 (*)    Hemoglobin 10.9 (*)    HCT 34.3 (*)    Neutro Abs 10.7 (*)    All other components within normal limits  BASIC METABOLIC PANEL - Abnormal; Notable for the following components:   Chloride 97 (*)    Glucose, Bld 216 (*)    BUN 44 (*)    Creatinine, Ser 2.01 (*)    Calcium 8.1 (*)    GFR calc non Af Amer 24 (*)    GFR calc Af Amer 28 (*)    All other components within normal limits  I-STAT VENOUS BLOOD GAS, ED - Abnormal; Notable for the following components:   pO2, Ven 55.0 (*)    All other components within normal limits  BLOOD GAS, VENOUS  BASIC METABOLIC PANEL  URINALYSIS, ROUTINE W REFLEX MICROSCOPIC  I-STAT TROPONIN, ED    EKG EKG Interpretation  Date/Time:  Wednesday December 19 2017 17:46:46 EDT Ventricular Rate:  77 PR Interval:    QRS Duration: 93 QT Interval:  417 QTC Calculation: 472 R Axis:   13 Text Interpretation:  Sinus rhythm Borderline T abnormalities, anterior leads Baseline wander in lead(s) V4 V5 No significant change since last tracing Confirmed by Deno Etienne [phone]) on 12/19/2017 5:54:15 PM   Radiology Dg Chest Portable 1 View  Result Date: 12/19/2017 CLINICAL DATA:  Shortness of breath EXAM: PORTABLE CHEST 1 VIEW COMPARISON:  October 09, 2017 FINDINGS: There is atelectatic change in the left base. Lungs elsewhere are clear. Heart is upper normal in size with pulmonary vascularity normal. No adenopathy. No bone lesions. IMPRESSION: Left base atelectasis. No edema or consolidation. Stable cardiac silhouette. Electronically Signed   By: Lowella Grip III M.D.   On: 12/19/2017 18:20     Procedures Procedures (including critical care time)  Medications Ordered in ED Medications  methylPREDNISolone sodium succinate (SOLU-MEDROL) 125 mg/2 mL injection 60 mg (has no administration in time range)  acetaminophen (TYLENOL) tablet 650 mg (has no administration in time range)    Or  acetaminophen (TYLENOL) suppository 650 mg (has no administration in time range)  ondansetron (ZOFRAN) tablet 4 mg (has no administration in time range)    Or  ondansetron (ZOFRAN) injection 4 mg (has no administration in time range)  albuterol (PROVENTIL) (2.5 MG/3ML) 0.083% nebulizer solution 2.5 mg (has no administration in time range)  albuterol (PROVENTIL) (2.5 MG/3ML) 0.083% nebulizer solution 3 mL (has no administration in time range)  ALPRAZolam (XANAX) tablet 0.5 mg (has no administration in time range)  arformoterol (BROVANA) nebulizer solution 15 mcg (has no administration in time range)  aspirin EC tablet 81 mg (has no administration in time range)  atorvastatin (LIPITOR) tablet 40 mg (has no administration in time range)  budesonide (PULMICORT) nebulizer solution 0.5 mg (has no administration in time range)  diltiazem (CARDIZEM CD) 24 hr capsule 240 mg (has no administration in time range)  divalproex (DEPAKOTE ER) 24 hr tablet 1,000 mg (has no administration in time range)  pantoprazole (PROTONIX) EC tablet 80 mg (has no administration in time range)  estradiol (ESTRACE) tablet 1 mg (has no administration in time range)  fluticasone (FLONASE)  50 MCG/ACT nasal spray 2 spray (has no administration in time range)  gabapentin (NEURONTIN) capsule 100 mg (has no administration in time range)  hydrALAZINE (APRESOLINE) tablet 25 mg (has no administration in time range)  HYDROcodone-homatropine (HYCODAN) 5-1.5 MG/5ML syrup 5 mL (has no administration in time range)  montelukast (SINGULAIR) tablet 10 mg (has no administration in time range)  nebivolol (BYSTOLIC) tablet 5 mg (has no  administration in time range)  enoxaparin (LOVENOX) injection 30 mg (has no administration in time range)  insulin aspart (novoLOG) injection 0-9 Units (has no administration in time range)  traMADol (ULTRAM) tablet 50 mg (has no administration in time range)  magnesium sulfate IVPB 2 g 50 mL (0 g Intravenous Stopped 12/19/17 1858)  albuterol (PROVENTIL,VENTOLIN) solution continuous neb (10 mg/hr Nebulization Given 12/19/17 1851)  ipratropium-albuterol (DUONEB) 0.5-2.5 (3) MG/3ML nebulizer solution 3 mL (3 mLs Nebulization Given 12/19/17 2153)     Initial Impression / Assessment and Plan / ED Course  I have reviewed the triage vital signs and the nursing notes.  Pertinent labs & imaging results that were available during my care of the patient were reviewed by me and considered in my medical decision making (see chart for details).     [name] presents in respiratory distress via EMS.  Her presentation is consistent with an asthma exacerbation.  I do not suspect ACS, PE, pulmonary edema, angioedema, stridor, or anaphylaxis.  She has already received multiple breathing treatments as well as Solu-Medrol.  She will be continued on BiPAP in the ED and given magnesium sulfate.  She will also be given continuous albuterol.  Her ECG reveals no evidence of acute ischemia or dysrhythmia.  Due to her productive cough since Monday, we will also obtain a chest x-ray to evaluate for possible pneumonia. This revealed no acute abnormalities.  Her Cr was 2.01, Cl 97, WBC 13.1, and her troponin was undetectable.  VBG 7.346/46.5/25.4.  We discussed the risks and benefits of epinephrine with her.  Due to her history of an NSTEMI and cardiac disease, we deferred this.  She was admitted to the hospitalist service for further care and management.  She appeared improved on multiple serial reassessments.  Although she continues to wheeze, she continues to wheeze bilaterally.   Final Clinical Impressions(s) /  ED Diagnoses   Final diagnoses:  Severe asthma with acute exacerbation, unspecified whether persistent    ED Discharge Orders    None       Alford Highland, MD 12/19/17 [place], DO 12/19/17 2326

## 2017-12-19 NOTE — Telephone Encounter (Signed)
[name] I did try to see if she had Medicare Part B but she has a supplemental insurance and I don't see a Medicare card on file. I called [place] and they said she only has [facility] Medicare Part D coverage. On the phone with the patient she gave me the information that we have scanned in the computer. We can try to send it to DME and see if they are cheaper.

## 2017-12-19 NOTE — H&P (Signed)
History and Physical    [name]:[id number] DOB: [birth year] DOA: 12/19/2017  PCP: [name], MD  Patient coming from: Home  I have personally briefly reviewed patient's old medical records in [place]  Chief Complaint: Respiratory distress  HPI: [name] is a 67 y.o. female with medical history significant of CAD, chronic grade 2 diastolic CHF, DM, HTN, prior-PE, recurrent admissions for asthma exacerbations vs upper airway cough syndrome.  Patient developed SOB onset Monday, associated wheezing.  This worsened despite home nebs.  EMS called for respiratory distress.  Patient got albuterol and solumedrol en-route to ED.  Arrives to ED on CPAP.    Feels like her typical asthma symptoms she says.   ED Course: Patient given further neb treatments, magnesium, and put on BIPAP.  Productive cough since Monday but no fever.   Review of Systems: As per HPI otherwise 10 point review of systems negative.   Past Medical History:  Diagnosis Date  . Anxiety   . Asthma   . Bronchitis   . Chronic diastolic CHF (congestive heart failure) ([place])   . Chronic diastolic CHF (congestive heart failure) ([place]) 07/17/2016  . Coronary artery disease    a. NSTEMI 12/2015 - LHC 01/18/16: s/p overlapping DESx2 to RCA, 10% ost-prox Cx,10% mLAD.  Marland Kitchen Depression   . Diabetes mellitus   . Dyslipidemia   . GERD (gastroesophageal reflux disease)   . Heart attack ([place])   . Hypertension   . Hypertensive heart disease   . NSTEMI (non-ST elevated myocardial infarction) ([place]) 12/2015  . Pneumonia     Past Surgical History:  Procedure Laterality Date  . ABDOMINAL HYSTERECTOMY    . CARDIAC CATHETERIZATION  1994   minimal LAD dz, no other dz, EF normal  . CARDIAC CATHETERIZATION N/A 01/18/2016   Procedure: Left Heart Cath and Coronary Angiography;  Surgeon: Burnell Blanks, MD;  Location: [facility];  Service: Cardiovascular;  Laterality: N/A;  . CARDIAC CATHETERIZATION  N/A 01/18/2016   Procedure: Coronary Stent Intervention;  Surgeon: Burnell Blanks, MD;  Location: [facility];  Service: Cardiovascular;  Laterality: N/A;  . COLONOSCOPY WITH PROPOFOL N/A 04/09/2014   Procedure: COLONOSCOPY WITH PROPOFOL;  Surgeon: Cleotis Nipper, MD;  Location: [facility];  Service: Endoscopy;  Laterality: N/A;  . CORONARY STENT PLACEMENT  01/19/2016   STENT SYNERGY DES 3.15Q00 drug eluting stent was successfully placed, and overlaps the 2.5 x 38 mm Synergy stent placed distally.  . ESOPHAGOGASTRODUODENOSCOPY (EGD) WITH PROPOFOL N/A 04/09/2014   Procedure: ESOPHAGOGASTRODUODENOSCOPY (EGD) WITH PROPOFOL;  Surgeon: Cleotis Nipper, MD;  Location: [facility];  Service: Endoscopy;  Laterality: N/A;  . HEMORRHOID SURGERY    . HERNIA REPAIR    . KNEE ARTHROSCOPY       reports that she quit smoking about 13 years ago. Her smoking use included cigarettes. She has a 30.00 pack-year smoking history. She has never used smokeless tobacco. She reports that she does not drink alcohol or use drugs.  Allergies  Allergen Reactions  . Amlodipine Besylate Other (See Comments)    Reaction unknown  . Lisinopril Other (See Comments)    Reaction unknown  . Pantoprazole Sodium Other (See Comments)    Reaction unknown    Family History  Problem Relation Age of Onset  . Allergies Mother   . Asthma Mother   . CAD Father 6  . Diabetes Father   . Hypertension Father   . Congestive Heart Failure Sister  died in her 56s  . CAD Sister 84     Prior to Admission medications   Medication Sig Start Date End Date Taking? Authorizing Provider  albuterol (PROAIR HFA) 108 (90 Base) MCG/ACT inhaler Inhale 2 puffs into the lungs every 4 (four) hours as needed for wheezing or shortness of breath. 06/29/17  Yes Hayden Rasmussen, MD  albuterol (PROVENTIL) (2.5 MG/3ML) 0.083% nebulizer solution Take 3 mLs (2.5 mg total) by nebulization 3 (three) times daily. And every 4 hours  as needed Patient taking differently: Take 2.5 mg by nebulization every 4 (four) hours as needed for wheezing. And every 4 hours as needed 10/18/17  Yes Rai, Ripudeep K, MD  ALPRAZolam (XANAX) 0.5 MG tablet Take 1 tablet (0.5 mg total) by mouth 2 (two) times daily as needed for anxiety. 07/31/15  Yes Theodis Blaze, MD  arformoterol (BROVANA) 15 MCG/2ML NEBU Take 2 mLs (15 mcg total) by nebulization 2 (two) times daily. 12/19/17  Yes Martyn Ehrich, NP  aspirin EC 81 MG tablet Take 81 mg by mouth daily.    Yes [provider]  atorvastatin (LIPITOR) 40 MG tablet Take 40 mg by mouth daily.   Yes [provider]  Biotin 5000 MCG CAPS Take 1 capsule by mouth daily.   Yes [provider]  budesonide (PULMICORT) 0.5 MG/2ML nebulizer solution Take 2 mLs (0.5 mg total) by nebulization 2 (two) times daily. 10/18/17  Yes Rai, Ripudeep K, MD  diltiazem (CARDIZEM CD) 240 MG 24 hr capsule Take 1 capsule (240 mg total) by mouth daily. 09/17/17  Yes Burtis Junes, NP  divalproex (DEPAKOTE ER) 500 MG 24 hr tablet Take 2 tablets (1,000 mg total) by mouth at bedtime. 05/29/17  Yes Dennie Bible, NP  esomeprazole (NEXIUM) 40 MG capsule Take 1 capsule (40 mg total) by mouth 2 (two) times daily before a meal. 10/18/17  Yes Rai, Ripudeep K, MD  estradiol (ESTRACE) 1 MG tablet Take 1 mg by mouth daily.    Yes [provider]  fluticasone (FLONASE) 50 MCG/ACT nasal spray Place 2 sprays into both nostrils daily. 10/19/17  Yes Rai, Ripudeep K, MD  gabapentin (NEURONTIN) 100 MG capsule Take 1 capsule (100 mg total) by mouth 3 (three) times daily. 12/17/17  Yes Martyn Ehrich, NP  hydrALAZINE (APRESOLINE) 25 MG tablet Take 1 tablet (25 mg total) by mouth 2 (two) times daily. 09/17/17  Yes Burtis Junes, NP  HYDROcodone-homatropine (HYCODAN) 5-1.5 MG/5ML syrup Take 5 mLs by mouth every 6 (six) hours as needed for cough. 12/18/17  Yes Martyn Ehrich, NP  metFORMIN (GLUCOPHAGE)  1000 MG tablet Take 1 tablet (1,000 mg total) by mouth 2 (two) times daily with a meal. 10/18/17  Yes Rai, Ripudeep K, MD  montelukast (SINGULAIR) 10 MG tablet Take 1 tablet (10 mg total) by mouth daily. 10/19/17  Yes Rai, Ripudeep K, MD  nebivolol (BYSTOLIC) 5 MG tablet Take 5 mg by mouth daily.   Yes [provider]  nitroGLYCERIN (NITROSTAT) 0.4 MG SL tablet Place 1 tablet (0.4 mg total) under the tongue every 5 (five) minutes as needed for chest pain. x3 as needed 09/17/17  Yes Gerhardt, Marlane Hatcher, NP  potassium chloride SA (K-DUR,KLOR-CON) 20 MEQ tablet Take 20 mEq by mouth daily.    Yes [provider]  torsemide (DEMADEX) 20 MG tablet Take 20 mg by mouth daily.  10/25/16  Yes [provider]  traMADol (ULTRAM) 50 MG tablet Take 1 tablet (  50 mg total) by mouth every 6 (six) hours as needed for up to 7 days. 12/17/17 12/24/17 Yes Martyn Ehrich, NP    Physical Exam: Vitals:   12/19/17 2130 12/19/17 2153 12/19/17 2200 12/19/17 2215  BP: (!) 148/88 (!) 161/107 (!) 143/84 (!) 159/83  Pulse: 84 83 83 83  Resp: 12 11 11 11   SpO2: 100% 100% 100% 100%  Weight:      Height:        Constitutional: NAD, calm, comfortable Eyes: PERRL, lids and conjunctivae normal ENMT: Mucous membranes are moist. Posterior pharynx clear of any exudate or lesions.Normal dentition.  Neck: normal, supple, no masses, no thyromegaly Respiratory: Diffuse wheezing Cardiovascular: Regular rate and rhythm, no murmurs / rubs / gallops. No extremity edema. 2+ pedal pulses. No carotid bruits.  Abdomen: no tenderness, no masses palpated. No hepatosplenomegaly. Bowel sounds positive.  Musculoskeletal: no clubbing / cyanosis. No joint deformity upper and lower extremities. Good ROM, no contractures. Normal muscle tone.  Skin: no rashes, lesions, ulcers. No induration Neurologic: CN 2-12 grossly intact. Sensation intact, DTR normal. Strength 5/5 in all 4.  Psychiatric: Normal judgment and insight. Alert  and oriented x 3. Normal mood.    Labs on Admission: I have personally reviewed following labs and imaging studies  CBC: Recent Labs  Lab 12/19/17 1750  WBC 13.1*  NEUTROABS 10.7*  HGB 10.9*  HCT 34.3*  MCV 99.1  PLT 628   Basic Metabolic Panel: Recent Labs  Lab 12/19/17 1750  NA 135  K 4.5  CL 97*  CO2 23  GLUCOSE 216*  BUN 44*  CREATININE 2.01*  CALCIUM 8.1*   GFR: Estimated Creatinine Clearance: 24.9 mL/min (A) (by C-G formula based on SCr of 2.01 mg/dL (H)). Liver Function Tests: No results for input(s): AST, ALT, ALKPHOS, BILITOT, PROT, ALBUMIN in the last 168 hours. No results for input(s): LIPASE, AMYLASE in the last 168 hours. No results for input(s): AMMONIA in the last 168 hours. Coagulation Profile: No results for input(s): INR, PROTIME in the last 168 hours. Cardiac Enzymes: No results for input(s): CKTOTAL, CKMB, CKMBINDEX, TROPONINI in the last 168 hours. BNP (last 3 results) No results for input(s): PROBNP in the last 8760 hours. HbA1C: No results for input(s): HGBA1C in the last 72 hours. CBG: No results for input(s): GLUCAP in the last 168 hours. Lipid Profile: No results for input(s): CHOL, HDL, LDLCALC, TRIG, CHOLHDL, LDLDIRECT in the last 72 hours. Thyroid Function Tests: No results for input(s): TSH, T4TOTAL, FREET4, T3FREE, THYROIDAB in the last 72 hours. Anemia Panel: No results for input(s): VITAMINB12, FOLATE, FERRITIN, TIBC, IRON, RETICCTPCT in the last 72 hours. Urine analysis:    Component Value Date/Time   COLORURINE YELLOW 08/07/2014 1304   APPEARANCEUR CLEAR 08/07/2014 1304   LABSPEC 1.012 08/07/2014 1304   PHURINE 5.0 08/07/2014 1304   GLUCOSEU NEGATIVE 08/07/2014 1304   HGBUR NEGATIVE 08/07/2014 1304   BILIRUBINUR NEGATIVE 08/07/2014 1304   KETONESUR NEGATIVE 08/07/2014 1304   PROTEINUR NEGATIVE 08/07/2014 1304   UROBILINOGEN 0.2 08/07/2014 1304   NITRITE NEGATIVE 08/07/2014 1304   LEUKOCYTESUR NEGATIVE 08/07/2014 1304      Radiological Exams on Admission: Dg Chest Portable 1 View  Result Date: 12/19/2017 CLINICAL DATA:  Shortness of breath EXAM: PORTABLE CHEST 1 VIEW COMPARISON:  October 09, 2017 FINDINGS: There is atelectatic change in the left base. Lungs elsewhere are clear. Heart is upper normal in size with pulmonary vascularity normal. No adenopathy. No bone lesions. IMPRESSION: Left base atelectasis.  No edema or consolidation. Stable cardiac silhouette. Electronically Signed   By: Lowella Grip III M.D.   On: 12/19/2017 18:20    EKG: Independently reviewed.  Assessment/Plan Principal Problem:   Acute bronchitis with asthma with acute exacerbation Active Problems:   Essential hypertension   Diabetes mellitus type 2, controlled ([place])   Upper airway cough syndrome vs Asthma    Acute respiratory failure with hypoxia (H[facility])   Chronic diastolic CHF (congestive heart failure) (H[facility])   Acute renal failure superimposed on stage 3 chronic kidney disease ([place])    1. Asthma exacerbation - causing acute resp failure with hypoxia 1. Adult wheeze protocol 2. Solumedrol 60mg  IV Q12H 3. Continue all home scheduled and PRN nebs 4. Cont singulair 5. BIPAP 6. Low risk [place] but cant use PERC to r/o patient given h/o PE in past.  Therefore will get D.Dimer.  Needs imaging if positive. 2. Upper airway cough syndrome vs asthma - 1. Asthma management as above 2. Hycodan PRN 3. AKI on CKD stage 3 - 1. Holding Demadex and Losartan 2. Repeat BMP in AM 3. Intake and output 4. UA pending 4. Chronic diastolic CHF - 1. Sounds like she went into exacerbation last admit with steroid treatment, had to be diuresed 2. Looks like baseline wt is 161 lbs, weighed 164 lbs x2 days ago in [place] office 3. No CXR findings of acute CHF 4. Will hold off on ordering torsemide for now given AKI 5. Check BNP 6. Daily weights ordered 7. Decide wether to resume this in AM 5. HTN - 1. Cont home meds other than losartan and  torsemide 6. DM2 - 1. Hold metformin 2. Sensitive SSI AC  DVT prophylaxis: Lovenox Code Status: Full Family Communication: Family at bedside Disposition Plan: Home after admit Consults called: None Admission status: Place in [place], [place] [facility] Pager [phone] Only works nights!  If 7AM-7PM, please contact the primary day team physician taking care of patient  [email or web address] Password [facility]  12/19/2017, 10:41 PM

## 2017-12-19 NOTE — Telephone Encounter (Signed)
Spoke with patient who had very audible wheezing.  Reports her wt is up 4 lbs overnight and has bilateral ankle edema.  She states she is also being treated for an asthma flare as well.  She has only taken (1) 20 mg Torsemide today.  Advised to go ahead and take 40 mg now along with another Potassium, to elevate for feet and legs, wear compression stockings, decrease sodium intake and limit fluid to less than 1.5 liters per day.  Advised to c/b tomorrow if no improvement.  Pt in agreement and grateful for the c/b.

## 2017-12-19 NOTE — Telephone Encounter (Signed)
Yes, please try to send through DME. If not cheaper we could try changing to Perforomist but would likely need PA. Also could consider patient assistance if you think she would qualify

## 2017-12-19 NOTE — Telephone Encounter (Signed)
Do you know if she gets [place] through the pharmacy or DME company???

## 2017-12-19 NOTE — ED Triage Notes (Signed)
Pt here from home via [facility].  Tx by pcp on Monday for asthma exacerbation.  Called 911 today for no relief after 3 nebx tx.  En-route given 10 albuterol, 1 atrovent, 125 mg solumedrol via C-pap.  Hr 90, rr 28, bp 160/106, cbg 189.

## 2017-12-19 NOTE — Telephone Encounter (Signed)
New Message    Pt c/o swelling: STAT is pt has developed SOB within 24 hours  1) How much weight have you gained and in what time span? 4lbs in one day  2) If swelling, where is the swelling located? feet  3) Are you currently taking a fluid pill?   4) Are you currently SOB? yes  5) Do you have a log of your daily weights (if so, list)? 163.8, 167.8   6) Have you gained 3 pounds in a day or 5 pounds in a week?yes  7) Have you traveled recently?no

## 2017-12-19 NOTE — ED Notes (Signed)
Notified respiratory of peak flow order

## 2017-12-19 NOTE — ED Notes (Signed)
Family outside of room states pt is having difficulty breathing, called respiratory to notify who is on their way.  MD at bedside.  Family asking for something to "make her rest" advised family that we don't normally give medications for that purpose especially when someone is on Bipap.

## 2017-12-19 NOTE — Telephone Encounter (Signed)
Spoke with pt, she would like to use another medication besides Garlon Hatchet because it cost 229 dollars. She was given the Rx from a doctor at the hospital. She is already on albuterol and pulmicort. What would be the next alternative? Please advise.

## 2017-12-19 NOTE — ED Notes (Signed)
Pt made aware we need urine, unable to go at this time.

## 2017-12-20 ENCOUNTER — Other Ambulatory Visit: Payer: Self-pay

## 2017-12-20 DIAGNOSIS — Z833 Family history of diabetes mellitus: Secondary | ICD-10-CM | POA: Diagnosis not present

## 2017-12-20 DIAGNOSIS — J209 Acute bronchitis, unspecified: Secondary | ICD-10-CM | POA: Diagnosis present

## 2017-12-20 DIAGNOSIS — Z8249 Family history of ischemic heart disease and other diseases of the circulatory system: Secondary | ICD-10-CM | POA: Diagnosis not present

## 2017-12-20 DIAGNOSIS — N183 Chronic kidney disease, stage 3 (moderate): Secondary | ICD-10-CM | POA: Diagnosis present

## 2017-12-20 DIAGNOSIS — J309 Allergic rhinitis, unspecified: Secondary | ICD-10-CM | POA: Diagnosis present

## 2017-12-20 DIAGNOSIS — E785 Hyperlipidemia, unspecified: Secondary | ICD-10-CM | POA: Diagnosis present

## 2017-12-20 DIAGNOSIS — F418 Other specified anxiety disorders: Secondary | ICD-10-CM | POA: Diagnosis present

## 2017-12-20 DIAGNOSIS — Z955 Presence of coronary angioplasty implant and graft: Secondary | ICD-10-CM | POA: Diagnosis not present

## 2017-12-20 DIAGNOSIS — I5033 Acute on chronic diastolic (congestive) heart failure: Secondary | ICD-10-CM | POA: Diagnosis present

## 2017-12-20 DIAGNOSIS — J45901 Unspecified asthma with (acute) exacerbation: Secondary | ICD-10-CM | POA: Diagnosis not present

## 2017-12-20 DIAGNOSIS — N179 Acute kidney failure, unspecified: Secondary | ICD-10-CM | POA: Diagnosis present

## 2017-12-20 DIAGNOSIS — I2510 Atherosclerotic heart disease of native coronary artery without angina pectoris: Secondary | ICD-10-CM | POA: Diagnosis present

## 2017-12-20 DIAGNOSIS — K219 Gastro-esophageal reflux disease without esophagitis: Secondary | ICD-10-CM | POA: Diagnosis present

## 2017-12-20 DIAGNOSIS — J4531 Mild persistent asthma with (acute) exacerbation: Secondary | ICD-10-CM | POA: Diagnosis present

## 2017-12-20 DIAGNOSIS — I252 Old myocardial infarction: Secondary | ICD-10-CM | POA: Diagnosis not present

## 2017-12-20 DIAGNOSIS — E1165 Type 2 diabetes mellitus with hyperglycemia: Secondary | ICD-10-CM | POA: Diagnosis not present

## 2017-12-20 DIAGNOSIS — J9811 Atelectasis: Secondary | ICD-10-CM | POA: Diagnosis present

## 2017-12-20 DIAGNOSIS — F329 Major depressive disorder, single episode, unspecified: Secondary | ICD-10-CM | POA: Diagnosis present

## 2017-12-20 DIAGNOSIS — J441 Chronic obstructive pulmonary disease with (acute) exacerbation: Secondary | ICD-10-CM | POA: Diagnosis present

## 2017-12-20 DIAGNOSIS — F419 Anxiety disorder, unspecified: Secondary | ICD-10-CM | POA: Diagnosis present

## 2017-12-20 DIAGNOSIS — I130 Hypertensive heart and chronic kidney disease with heart failure and stage 1 through stage 4 chronic kidney disease, or unspecified chronic kidney disease: Secondary | ICD-10-CM | POA: Diagnosis present

## 2017-12-20 DIAGNOSIS — E1142 Type 2 diabetes mellitus with diabetic polyneuropathy: Secondary | ICD-10-CM | POA: Diagnosis present

## 2017-12-20 DIAGNOSIS — J9601 Acute respiratory failure with hypoxia: Secondary | ICD-10-CM | POA: Diagnosis present

## 2017-12-20 DIAGNOSIS — E1122 Type 2 diabetes mellitus with diabetic chronic kidney disease: Secondary | ICD-10-CM | POA: Diagnosis present

## 2017-12-20 DIAGNOSIS — Z825 Family history of asthma and other chronic lower respiratory diseases: Secondary | ICD-10-CM | POA: Diagnosis not present

## 2017-12-20 LAB — RESPIRATORY PANEL BY PCR
ADENOVIRUS-RVPPCR: NOT DETECTED
Bordetella pertussis: NOT DETECTED
CORONAVIRUS 229E-RVPPCR: NOT DETECTED
CORONAVIRUS NL63-RVPPCR: NOT DETECTED
CORONAVIRUS OC43-RVPPCR: NOT DETECTED
Chlamydophila pneumoniae: NOT DETECTED
Coronavirus HKU1: NOT DETECTED
INFLUENZA A-RVPPCR: NOT DETECTED
INFLUENZA B-RVPPCR: NOT DETECTED
MYCOPLASMA PNEUMONIAE-RVPPCR: NOT DETECTED
Metapneumovirus: NOT DETECTED
PARAINFLUENZA VIRUS 1-RVPPCR: NOT DETECTED
Parainfluenza Virus 2: NOT DETECTED
Parainfluenza Virus 3: NOT DETECTED
Parainfluenza Virus 4: NOT DETECTED
Respiratory Syncytial Virus: NOT DETECTED
Rhinovirus / Enterovirus: DETECTED — AB

## 2017-12-20 LAB — URINALYSIS, ROUTINE W REFLEX MICROSCOPIC
Bilirubin Urine: NEGATIVE
GLUCOSE, UA: NEGATIVE mg/dL
Hgb urine dipstick: NEGATIVE
Ketones, ur: NEGATIVE mg/dL
LEUKOCYTES UA: NEGATIVE
NITRITE: NEGATIVE
Protein, ur: NEGATIVE mg/dL
SPECIFIC GRAVITY, URINE: 1.011 (ref 1.005–1.030)
pH: 5 (ref 5.0–8.0)

## 2017-12-20 LAB — BASIC METABOLIC PANEL
Anion gap: 15 (ref 5–15)
BUN: 46 mg/dL — AB (ref 8–23)
CO2: 23 mmol/L (ref 22–32)
Calcium: 7.8 mg/dL — ABNORMAL LOW (ref 8.9–10.3)
Chloride: 96 mmol/L — ABNORMAL LOW (ref 98–111)
Creatinine, Ser: 1.93 mg/dL — ABNORMAL HIGH (ref 0.44–1.00)
GFR calc Af Amer: 29 mL/min — ABNORMAL LOW (ref >60)
GFR calc non Af Amer: 25 mL/min — ABNORMAL LOW (ref >60)
GLUCOSE: 287 mg/dL — AB (ref 70–99)
POTASSIUM: 4.6 mmol/L (ref 3.5–5.1)
Sodium: 134 mmol/L — ABNORMAL LOW (ref 135–145)

## 2017-12-20 LAB — STREP PNEUMONIAE URINARY ANTIGEN: Strep Pneumo Urinary Antigen: NEGATIVE

## 2017-12-20 LAB — GLUCOSE, CAPILLARY
GLUCOSE-CAPILLARY: 265 mg/dL — AB (ref 70–99)
GLUCOSE-CAPILLARY: 262 mg/dL — AB (ref 70–99)
Glucose-Capillary: 226 mg/dL — ABNORMAL HIGH (ref 70–99)
Glucose-Capillary: 249 mg/dL — ABNORMAL HIGH (ref 70–99)

## 2017-12-20 LAB — D-DIMER, QUANTITATIVE (NOT AT ARMC): D DIMER QUANT: 0.4 ug{FEU}/mL (ref 0.00–0.50)

## 2017-12-20 LAB — MRSA PCR SCREENING: MRSA BY PCR: NEGATIVE

## 2017-12-20 LAB — BRAIN NATRIURETIC PEPTIDE: B Natriuretic Peptide: 34.1 pg/mL (ref 0.0–100.0)

## 2017-12-20 MED ORDER — GUAIFENESIN ER 600 MG PO TB12
600.0000 mg | ORAL_TABLET | Freq: Two times a day (BID) | ORAL | Status: DC
  Administered 2017-12-20 – 2017-12-24 (×9): 600 mg via ORAL
  Filled 2017-12-20 (×10): qty 1

## 2017-12-20 MED ORDER — FUROSEMIDE 10 MG/ML IJ SOLN
40.0000 mg | Freq: Once | INTRAMUSCULAR | Status: AC
  Administered 2017-12-20: 40 mg via INTRAVENOUS
  Filled 2017-12-20: qty 4

## 2017-12-20 MED ORDER — IPRATROPIUM-ALBUTEROL 0.5-2.5 (3) MG/3ML IN SOLN
3.0000 mL | Freq: Three times a day (TID) | RESPIRATORY_TRACT | Status: DC
  Administered 2017-12-20 – 2017-12-21 (×2): 3 mL via RESPIRATORY_TRACT
  Filled 2017-12-20 (×2): qty 3

## 2017-12-20 NOTE — Progress Notes (Signed)
RT note- Attempted Bipap off, patient is extremely wheezy, Nebs given, patient wanted to eat. Placed back to Bipap, while off Bipap sp02 remained 98% on 3l/min, with a productive cough, yellow sputum.

## 2017-12-20 NOTE — Progress Notes (Signed)
Inpatient Diabetes Program Recommendations  AACE/ADA: New Consensus Statement on Inpatient Glycemic Control (2019)  Target Ranges:  Prepandial:   less than 140 mg/dL      Peak postprandial:   less than 180 mg/dL (1-2 hours)      Critically ill patients:  140 - 180 mg/dL   Results for [name], [name] (MRN [id number]) as of 12/20/2017 09:44  Ref. Range 12/20/2017 07:41  Glucose-Capillary Latest Ref Range: 70 - 99 mg/dL 265 (H)  Results for [name], [name] (MRN [id number]) as of 12/20/2017 09:44  Ref. Range 12/19/2017 17:50 12/20/2017 03:46  Glucose Latest Ref Range: 70 - 99 mg/dL 216 (H) 287 (H)  Results for [name], [name] (MRN [id number]) as of 12/20/2017 09:44  Ref. Range 07/18/2016 03:52 10/06/2017 02:53  Hemoglobin A1C Latest Ref Range: 4.8 - 5.6 % 6.6 (H) 7.3 (H)   Review of Glycemic Control  Diabetes history: DM2 Outpatient Diabetes medications: Metformin 1000 mg BID Current orders for Inpatient glycemic control: Novolog 0-9 units TID with meals; Solumedrol 60 mg Q12H  Inpatient Diabetes Program Recommendations:  Insulin - Basal: If steroids are continued, please consider ordering Lantus 8 units Q24H (based on 75.9 kg x 0.1 units). Correction (SSI): Please consider ordering Novolog 0-5 units QHS for bedtime correction scale. Insulin - Meal Coverage: If post prandial glucose is consistently elevated >180 mg/dl after Lantus is added and if steroids continued please consider ordering Novolog 4 units TID with meals if patient eats at least 50% of meals.  Thanks, Barnie Alderman, RN, MSN, CDE Diabetes Coordinator Inpatient Diabetes Program [phone] (Team Pager from 8am to 5pm)

## 2017-12-20 NOTE — Telephone Encounter (Signed)
Called to f/u with patient however she called EMS last night and is currently admitted for treatment of asthma.

## 2017-12-20 NOTE — Progress Notes (Signed)
PROGRESS NOTE  [name] BOF:[id number] DOB: [birth year] DOA: 12/19/2017 PCP: [name], MD  HPI/Recap of past 24 hours:  Remain on bipap, feeling better, report chest tightness has improved, denies chest pain Report bilateral lower extremity edema No fever,   Assessment/Plan: Principal Problem:   Acute bronchitis with asthma with acute exacerbation Active Problems:   Essential hypertension   Diabetes mellitus type 2, controlled ([place])   Upper airway cough syndrome vs Asthma    Acute respiratory failure with hypoxia (HCC)   Chronic diastolic CHF (congestive heart failure) ([facility])   Acute renal failure superimposed on stage 3 chronic kidney disease ([facility])  Asthma exacerbation -she reports cough/wheezing/sob since Monday -she is brought in by EMS, she received solumdrol/nebs enroute by EMS, she was put on cpap by EMS, she is transitioned to bipap in the ED due to significant wheezing, tachypnea -initial abg ph wnl, negative ddimer -initial cxr " Left base atelectasis. No edema or consolidation. Stable cardiac silhouette" -mrsa screening negative -will get respiratory viral panel, urine strep pneumo and legionella antigen -add on mucinex, continue iv steroids, scheduled nebs, singulair,  wean bipap as tolerated   Acute on chronic diastolic chf -she has bilateral lower extremity pitting edem, she reports weight gain of 4lbs in the last few days -negative troponin, bmp 34 -home meds demadex held since admission, will give iv lasix ( concerning for malobsoprtion of oral diuretics in the setting of chf exacerbation) -daily weight, strict intake and output -monitor bp and cr  AKI on CKD III -ua unremarkable -home meds metformin held -she appear in chf exacerbation with lower extremity edema and weight gain, will try iv lasix, close monitor cr  HTN;  Home meds nebivolol/cardizem/hydralazine continued -demadex held, she is on iv lasix  noninsulin dependent dm2 with  peripheral neuropathy Last a1c 7.3% in 09/2017 Expect elevated blood glucose while on iv steroids Home meds metformin held, on ssi Continue neurontin    CAD s/p stent Denies chest pain, negative troponin Continue nebivolol, statin, asa   H/o seizures Stable on depakote 1000mg  qhs  Body mass index is 30.6 kg/m.   Code Status: full  Family Communication: patient   Disposition Plan: remain in stepdown   Consultants:  none  Procedures:  bipap  Antibiotics:  none   Objective: BP (!) 145/72 (BP Location: Left Arm)   Pulse 84   Temp 98.8 F (37.1 C) (Axillary)   Resp 20   Ht 5\' 2"  (1.575 m)   Wt 75.9 kg   LMP  (LMP Unknown)   SpO2 100%   BMI 30.60 kg/m   Intake/Output Summary (Last 24 hours) at 12/20/2017 7782 Last data filed at 12/20/2017 0200 Gross per 24 hour  Intake 50 ml  Output -  Net 50 ml   Filed Weights   12/19/17 1801 12/20/17 0235  Weight: 74.4 kg 75.9 kg    Exam: Patient is examined daily including today on 12/20/2017, exams remain the same as of yesterday except that has changed    General:  NAD  Cardiovascular: RRR  Respiratory: diminished, mild wheezing  Abdomen: Soft/ND/NT, positive BS  Musculoskeletal: bilateral lower extremity 1+ pitting Edema  Neuro: alert, oriented   Data Reviewed: Basic Metabolic Panel: Recent Labs  Lab 12/19/17 1750 12/20/17 0346  NA 135 134*  K 4.5 4.6  CL 97* 96*  CO2 23 23  GLUCOSE 216* 287*  BUN 44* 46*  CREATININE 2.01* 1.93*  CALCIUM 8.1* 7.8*   Liver Function Tests: No results  for input(s): AST, ALT, ALKPHOS, BILITOT, PROT, ALBUMIN in the last 168 hours. No results for input(s): LIPASE, AMYLASE in the last 168 hours. No results for input(s): AMMONIA in the last 168 hours. CBC: Recent Labs  Lab 12/19/17 1750  WBC 13.1*  NEUTROABS 10.7*  HGB 10.9*  HCT 34.3*  MCV 99.1  PLT 309   Cardiac Enzymes:   No results for input(s): CKTOTAL, CKMB, CKMBINDEX, TROPONINI in the last 168  hours. BNP (last 3 results) Recent Labs    08/12/17 0544 10/05/17 1625 12/20/17 0028  BNP 46.4 30.0 34.1    ProBNP (last 3 results) No results for input(s): PROBNP in the last 8760 hours.  CBG: Recent Labs  Lab 12/20/17 0741  GLUCAP 265*    Recent Results (from the past 240 hour(s))  MRSA PCR Screening     Status: None   Collection Time: 12/20/17  6:37 AM  Result Value Ref Range Status   MRSA by PCR NEGATIVE NEGATIVE Final    Comment:        The GeneXpert MRSA Assay (FDA approved for NASAL specimens only), is one component of a comprehensive MRSA colonization surveillance program. It is not intended to diagnose MRSA infection nor to guide or monitor treatment for MRSA infections. Performed at [name], [place] [address], [place], [place] [address]      Studies: Dg Chest Portable 1 View  Result Date: 12/19/2017 CLINICAL DATA:  Shortness of breath EXAM: PORTABLE CHEST 1 VIEW COMPARISON:  October 09, 2017 FINDINGS: There is atelectatic change in the left base. Lungs elsewhere are clear. Heart is upper normal in size with pulmonary vascularity normal. No adenopathy. No bone lesions. IMPRESSION: Left base atelectasis. No edema or consolidation. Stable cardiac silhouette. Electronically Signed   By: Lowella Grip III M.D.   On: 12/19/2017 18:20    Scheduled Meds: . albuterol  2.5 mg Nebulization TID  . arformoterol  15 mcg Nebulization BID  . aspirin EC  81 mg Oral Daily  . atorvastatin  40 mg Oral Daily  . budesonide  0.5 mg Nebulization BID  . diltiazem  240 mg Oral Daily  . divalproex  1,000 mg Oral QHS  . enoxaparin (LOVENOX) injection  30 mg Subcutaneous QHS  . estradiol  1 mg Oral Daily  . fluticasone  2 spray Each Nare Daily  . gabapentin  100 mg Oral TID  . guaiFENesin  600 mg Oral BID  . hydrALAZINE  25 mg Oral BID  . insulin aspart  0-9 Units Subcutaneous TID WC  . methylPREDNISolone (SOLU-MEDROL) injection  60 mg Intravenous Q12H  .  montelukast  10 mg Oral Daily  . nebivolol  5 mg Oral Daily  . pantoprazole  80 mg Oral Q1200    Continuous Infusions:   Time spent: 40mins I have personally reviewed and interpreted on  12/20/2017 daily labs, tele strips, imagings as discussed above under date review session and assessment and plans.  I reviewed all nursing notes, pharmacy notes,  vitals, pertinent old records  I have discussed plan of care as described above with RN , patient  on 12/20/2017   Florencia Reasons MD, PhD  [facility] Pager [phone]. If 7PM-7AM, please contact night-coverage at [email or web address], password [facility] 12/20/2017, 8:23 AM  LOS: 0 days

## 2017-12-21 LAB — COMPREHENSIVE METABOLIC PANEL
ALBUMIN: 3.5 g/dL (ref 3.5–5.0)
ALT: 15 U/L (ref 0–44)
ANION GAP: 10 (ref 5–15)
AST: 16 U/L (ref 15–41)
Alkaline Phosphatase: 41 U/L (ref 38–126)
BUN: 40 mg/dL — ABNORMAL HIGH (ref 8–23)
CO2: 28 mmol/L (ref 22–32)
Calcium: 8.3 mg/dL — ABNORMAL LOW (ref 8.9–10.3)
Chloride: 100 mmol/L (ref 98–111)
Creatinine, Ser: 1.32 mg/dL — ABNORMAL HIGH (ref 0.44–1.00)
GFR calc Af Amer: 47 mL/min — ABNORMAL LOW (ref >60)
GFR calc non Af Amer: 40 mL/min — ABNORMAL LOW (ref >60)
GLUCOSE: 327 mg/dL — AB (ref 70–99)
POTASSIUM: 4.1 mmol/L (ref 3.5–5.1)
SODIUM: 138 mmol/L (ref 135–145)
Total Bilirubin: 0.5 mg/dL (ref 0.3–1.2)
Total Protein: 6.7 g/dL (ref 6.5–8.1)

## 2017-12-21 LAB — GLUCOSE, CAPILLARY
GLUCOSE-CAPILLARY: 228 mg/dL — AB (ref 70–99)
GLUCOSE-CAPILLARY: 281 mg/dL — AB (ref 70–99)
GLUCOSE-CAPILLARY: 349 mg/dL — AB (ref 70–99)
Glucose-Capillary: 253 mg/dL — ABNORMAL HIGH (ref 70–99)

## 2017-12-21 LAB — LEGIONELLA PNEUMOPHILA SEROGP 1 UR AG: L. pneumophila Serogp 1 Ur Ag: NEGATIVE

## 2017-12-21 LAB — CBC WITH DIFFERENTIAL/PLATELET
Abs Immature Granulocytes: 0.2 10*3/uL — ABNORMAL HIGH (ref 0.0–0.1)
BASOS ABS: 0 10*3/uL (ref 0.0–0.1)
Basophils Relative: 0 %
EOS ABS: 0 10*3/uL (ref 0.0–0.7)
EOS PCT: 0 %
HCT: 32.1 % — ABNORMAL LOW (ref 36.0–46.0)
HEMOGLOBIN: 10.4 g/dL — AB (ref 12.0–15.0)
IMMATURE GRANULOCYTES: 1 %
LYMPHS PCT: 7 %
Lymphs Abs: 0.8 10*3/uL (ref 0.7–4.0)
MCH: 31.8 pg (ref 26.0–34.0)
MCHC: 32.4 g/dL (ref 30.0–36.0)
MCV: 98.2 fL (ref 78.0–100.0)
Monocytes Absolute: 0.9 10*3/uL (ref 0.1–1.0)
Monocytes Relative: 8 %
NEUTROS PCT: 84 %
Neutro Abs: 10.1 10*3/uL — ABNORMAL HIGH (ref 1.7–7.7)
Platelets: 287 10*3/uL (ref 150–400)
RBC: 3.27 MIL/uL — ABNORMAL LOW (ref 3.87–5.11)
RDW: 13.2 % (ref 11.5–15.5)
WBC: 12 10*3/uL — AB (ref 4.0–10.5)

## 2017-12-21 LAB — MAGNESIUM: Magnesium: 2.2 mg/dL (ref 1.7–2.4)

## 2017-12-21 MED ORDER — IPRATROPIUM-ALBUTEROL 0.5-2.5 (3) MG/3ML IN SOLN
3.0000 mL | Freq: Four times a day (QID) | RESPIRATORY_TRACT | Status: DC
  Administered 2017-12-21 – 2017-12-22 (×4): 3 mL via RESPIRATORY_TRACT
  Filled 2017-12-21 (×4): qty 3

## 2017-12-21 MED ORDER — INSULIN GLARGINE 100 UNIT/ML ~~LOC~~ SOLN
5.0000 [IU] | Freq: Two times a day (BID) | SUBCUTANEOUS | Status: DC
  Administered 2017-12-21 – 2017-12-22 (×2): 5 [IU] via SUBCUTANEOUS
  Filled 2017-12-21 (×3): qty 0.05

## 2017-12-21 MED ORDER — SALINE SPRAY 0.65 % NA SOLN
1.0000 | NASAL | Status: DC | PRN
  Filled 2017-12-21: qty 44

## 2017-12-21 MED ORDER — METHYLPREDNISOLONE SODIUM SUCC 125 MG IJ SOLR
60.0000 mg | Freq: Four times a day (QID) | INTRAMUSCULAR | Status: DC
  Administered 2017-12-21 – 2017-12-22 (×5): 60 mg via INTRAVENOUS
  Filled 2017-12-21 (×4): qty 2

## 2017-12-21 MED ORDER — FUROSEMIDE 10 MG/ML IJ SOLN
40.0000 mg | Freq: Every day | INTRAMUSCULAR | Status: DC
  Administered 2017-12-22: 40 mg via INTRAVENOUS
  Filled 2017-12-21: qty 4

## 2017-12-21 MED ORDER — INSULIN ASPART 100 UNIT/ML ~~LOC~~ SOLN
3.0000 [IU] | Freq: Three times a day (TID) | SUBCUTANEOUS | Status: DC
  Administered 2017-12-22 (×3): 3 [IU] via SUBCUTANEOUS

## 2017-12-21 NOTE — Progress Notes (Signed)
Patient has BIPAP ordered QHS. Patient would rather not wear tonight. BIPAP is on standby at bedside if needed. RT will continue to monitor patient and place on if needed.

## 2017-12-21 NOTE — Progress Notes (Signed)
Inpatient Diabetes Program Recommendations  AACE/ADA: New Consensus Statement on Inpatient Glycemic Control (2019)  Target Ranges:  Prepandial:   less than 140 mg/dL      Peak postprandial:   less than 180 mg/dL (1-2 hours)      Critically ill patients:  140 - 180 mg/dL  Results for [name], [name] (MRN [id number]) as of 12/21/2017 08:55  Ref. Range 12/20/2017 07:41 12/20/2017 11:50 12/20/2017 16:47 12/20/2017 22:00 12/21/2017 07:28  Glucose-Capillary Latest Ref Range: 70 - 99 mg/dL 265 (H) 226 (H) 249 (H) 262 (H) 253 (H)   Results for [name], [name] (MRN [id number]) as of 12/20/2017 09:44  Ref. Range 12/19/2017 17:50 12/20/2017 03:46  Glucose Latest Ref Range: 70 - 99 mg/dL 216 (H) 287 (H)  Results for [name], [name] (MRN [id number]) as of 12/20/2017 09:44  Ref. Range 07/18/2016 03:52 10/06/2017 02:53  Hemoglobin A1C Latest Ref Range: 4.8 - 5.6 % 6.6 (H) 7.3 (H)   Review of Glycemic Control  Diabetes history: DM2 Outpatient Diabetes medications: Metformin 1000 mg BID Current orders for Inpatient glycemic control: Novolog 0-9 units TID with meals; Solumedrol 60 mg Q12H  Inpatient Diabetes Program Recommendations:  Insulin - Basal: If steroids are continued, please consider ordering Lantus 8 units Q24H (based on 75.9 kg x 0.1 units). Correction (SSI): Please consider ordering Novolog 0-5 units QHS for bedtime correction scale. Insulin - Meal Coverage: If post prandial glucose is consistently elevated >180 mg/dl after Lantus is added and if steroids continued please consider ordering Novolog 4 units TID with meals if patient eats at least 50% of meals.  Thanks, Barnie Alderman, RN, MSN, CDE Diabetes Coordinator Inpatient Diabetes Program [phone] (Team Pager from 8am to 5pm)

## 2017-12-21 NOTE — Progress Notes (Signed)
PROGRESS NOTE  ITA FRITZSCHE WUJ:[id number] DOB: 1951/02/10 DOA: 12/19/2017 PCP: [name], MD  HPI/Recap of past 24 hours:   she started to wheeze badly this am, feel congested with running nose  She is weaned off continuous bipap yesterday, now she is on bipap qhs  No fever,   Assessment/Plan: Principal Problem:   Acute bronchitis with asthma with acute exacerbation Active Problems:   Essential hypertension   Diabetes mellitus type 2, controlled ([place])   Upper airway cough syndrome vs Asthma    Acute respiratory failure with hypoxia (H[facility])   Chronic diastolic CHF (congestive heart failure) ([place])   Acute renal failure superimposed on stage 3 chronic kidney disease ([place])  Asthma exacerbation -she reports cough/wheezing/sob since Monday, she reports this year she has several hospitalization for asthma exacerbation, last in 09/2017 when she stayed for 13days, she denies h/o intubation -she is brought in by EMS, she received solumdrol/nebs enroute by EMS, she was put on cpap by EMS, she is transitioned to bipap in the ED due to significant wheezing, tachypnea -initial abg ph wnl, negative ddimer -initial cxr " Left base atelectasis. No edema or consolidation. Stable cardiac silhouette" -mrsa screening negative -respiratory viral panel + rhinovirus, urine strep pneumo negative, urine legionella antigen negative -wheezing become worse, increase steroids and nebs frequency, continue mucinex,  singulair,    Acute on chronic diastolic chf -she has bilateral lower extremity pitting edem, she reports weight gain of 4lbs in the last few days -negative troponin, bmp 34 -home meds demadex held since admission,  iv lasix ( concerning for malobsoprtion of oral diuretics in the setting of chf exacerbation) -daily weight, strict intake and output -monitor bp and cr  AKI on CKD III -ua unremarkable -home meds metformin held -she appear in chf exacerbation with lower extremity edema and  weight gain, she received iv lasix,  -cr improving, close monitor cr  HTN;  Home meds nebivolol/cardizem/hydralazine continued -demadex held, she is on iv lasix  noninsulin dependent dm2 with peripheral neuropathy Last a1c 7.3% in 09/2017 Expect elevated blood glucose while on iv steroids Home meds metformin held, on ssi, add meal coverage and start lantus now that she needs higher dose of iv steroids Continue neurontin    CAD s/p stent Denies chest pain, negative troponin Continue nebivolol, statin, asa   H/o seizures Stable on depakote 1000mg  qhs  Body mass index is 30.56 kg/m.   Code Status: full  Family Communication: patient   Disposition Plan: remain in stepdown   Consultants:  none  Procedures:  bipap  Antibiotics:  none   Objective: BP (!) 173/77 (BP Location: Right Arm) Comment: nurse notified  Pulse 86   Temp 97.7 F (36.5 C) (Oral)   Resp 18   Ht 5\' 2"  (1.575 m)   Wt 75.8 kg   LMP  (LMP Unknown)   SpO2 98%   BMI 30.56 kg/m   Intake/Output Summary (Last 24 hours) at 12/21/2017 1002 Last data filed at 12/21/2017 9562 Gross per 24 hour  Intake 480 ml  Output 500 ml  Net -20 ml   Filed Weights   12/19/17 1801 12/20/17 0235 12/21/17 0500  Weight: 74.4 kg 75.9 kg 75.8 kg    Exam: Patient is examined daily including today on 12/21/2017, exams remain the same as of yesterday except that has changed    General:  NAD  Cardiovascular: RRR  Respiratory: diffuse bilateral wheezing  Abdomen: Soft/ND/NT, positive BS  Musculoskeletal: bilateral lower extremity 1+ pitting Edema  Neuro: alert, oriented   Data Reviewed: Basic Metabolic Panel: Recent Labs  Lab 12/19/17 1750 12/20/17 0346 12/21/17 0232  NA 135 134* 138  K 4.5 4.6 4.1  CL 97* 96* 100  CO2 23 23 28   GLUCOSE 216* 287* 327*  BUN 44* 46* 40*  CREATININE 2.01* 1.93* 1.32*  CALCIUM 8.1* 7.8* 8.3*  MG  --   --  2.2   Liver Function Tests: Recent Labs  Lab  12/21/17 0232  AST 16  ALT 15  ALKPHOS 41  BILITOT 0.5  PROT 6.7  ALBUMIN 3.5   No results for input(s): LIPASE, AMYLASE in the last 168 hours. No results for input(s): AMMONIA in the last 168 hours. CBC: Recent Labs  Lab 12/19/17 1750 12/21/17 0232  WBC 13.1* 12.0*  NEUTROABS 10.7* 10.1*  HGB 10.9* 10.4*  HCT 34.3* 32.1*  MCV 99.1 98.2  PLT 309 287   Cardiac Enzymes:   No results for input(s): CKTOTAL, CKMB, CKMBINDEX, TROPONINI in the last 168 hours. BNP (last 3 results) Recent Labs    08/12/17 0544 10/05/17 1625 12/20/17 0028  BNP 46.4 30.0 34.1    ProBNP (last 3 results) No results for input(s): PROBNP in the last 8760 hours.  CBG: Recent Labs  Lab 12/20/17 0741 12/20/17 1150 12/20/17 1647 12/20/17 2200 12/21/17 0728  GLUCAP 265* 226* 249* 262* 253*    Recent Results (from the past 240 hour(s))  MRSA PCR Screening     Status: None   Collection Time: 12/20/17  6:37 AM  Result Value Ref Range Status   MRSA by PCR NEGATIVE NEGATIVE Final    Comment:        The GeneXpert MRSA Assay (FDA approved for NASAL specimens only), is one component of a comprehensive MRSA colonization surveillance program. It is not intended to diagnose MRSA infection nor to guide or monitor treatment for MRSA infections. Performed at [facility], [place] [address] [place], [place] [address]   Respiratory Panel by PCR     Status: Abnormal   Collection Time: 12/20/17  2:42 PM  Result Value Ref Range Status   Adenovirus NOT DETECTED NOT DETECTED Final   Coronavirus 229E NOT DETECTED NOT DETECTED Final   Coronavirus HKU1 NOT DETECTED NOT DETECTED Final   Coronavirus NL63 NOT DETECTED NOT DETECTED Final   Coronavirus OC43 NOT DETECTED NOT DETECTED Final   Metapneumovirus NOT DETECTED NOT DETECTED Final   Rhinovirus / Enterovirus DETECTED (A) NOT DETECTED Final   Influenza A NOT DETECTED NOT DETECTED Final   Influenza B NOT DETECTED NOT DETECTED Final   Parainfluenza  Virus 1 NOT DETECTED NOT DETECTED Final   Parainfluenza Virus 2 NOT DETECTED NOT DETECTED Final   Parainfluenza Virus 3 NOT DETECTED NOT DETECTED Final   Parainfluenza Virus 4 NOT DETECTED NOT DETECTED Final   Respiratory Syncytial Virus NOT DETECTED NOT DETECTED Final   Bordetella pertussis NOT DETECTED NOT DETECTED Final   Chlamydophila pneumoniae NOT DETECTED NOT DETECTED Final   Mycoplasma pneumoniae NOT DETECTED NOT DETECTED Final    Comment: Performed at [facility], [place] [address], [place], [place] [address]     Studies: No results found.  Scheduled Meds: . arformoterol  15 mcg Nebulization BID  . aspirin EC  81 mg Oral Daily  . atorvastatin  40 mg Oral Daily  . budesonide  0.5 mg Nebulization BID  . diltiazem  240 mg Oral Daily  . divalproex  1,000 mg Oral QHS  . enoxaparin (  LOVENOX) injection  30 mg Subcutaneous QHS  . estradiol  1 mg Oral Daily  . fluticasone  2 spray Each Nare Daily  . gabapentin  100 mg Oral TID  . guaiFENesin  600 mg Oral BID  . hydrALAZINE  25 mg Oral BID  . insulin aspart  0-9 Units Subcutaneous TID WC  . ipratropium-albuterol  3 mL Nebulization Q6H  . methylPREDNISolone (SOLU-MEDROL) injection  60 mg Intravenous Q6H  . montelukast  10 mg Oral Daily  . nebivolol  5 mg Oral Daily  . pantoprazole  80 mg Oral Q1200    Continuous Infusions:   Time spent: 45mins I have personally reviewed and interpreted on  12/21/2017 daily labs, tele strips, imagings as discussed above under date review session and assessment and plans.  I reviewed all nursing notes, pharmacy notes,  vitals, pertinent old records  I have discussed plan of care as described above with RN , patient  on 12/21/2017   Florencia Reasons MD, PhD  [facility] Pager [phone]. If 7PM-7AM, please contact night-coverage at [email or web address], password [email or web address] 12/21/2017, 10:02 AM  LOS: 1 day

## 2017-12-22 LAB — CBC
HEMATOCRIT: 33.7 % — AB (ref 36.0–46.0)
HEMOGLOBIN: 10.9 g/dL — AB (ref 12.0–15.0)
MCH: 31.5 pg (ref 26.0–34.0)
MCHC: 32.3 g/dL (ref 30.0–36.0)
MCV: 97.4 fL (ref 78.0–100.0)
Platelets: 292 10*3/uL (ref 150–400)
RBC: 3.46 MIL/uL — AB (ref 3.87–5.11)
RDW: 13.2 % (ref 11.5–15.5)
WBC: 12 10*3/uL — ABNORMAL HIGH (ref 4.0–10.5)

## 2017-12-22 LAB — GLUCOSE, CAPILLARY
GLUCOSE-CAPILLARY: 288 mg/dL — AB (ref 70–99)
GLUCOSE-CAPILLARY: 296 mg/dL — AB (ref 70–99)
GLUCOSE-CAPILLARY: 421 mg/dL — AB (ref 70–99)
GLUCOSE-CAPILLARY: 416 mg/dL — AB (ref 70–99)
Glucose-Capillary: 303 mg/dL — ABNORMAL HIGH (ref 70–99)

## 2017-12-22 LAB — BASIC METABOLIC PANEL
Anion gap: 12 (ref 5–15)
BUN: 28 mg/dL — AB (ref 8–23)
CHLORIDE: 99 mmol/L (ref 98–111)
CO2: 25 mmol/L (ref 22–32)
Calcium: 8.4 mg/dL — ABNORMAL LOW (ref 8.9–10.3)
Creatinine, Ser: 1.12 mg/dL — ABNORMAL HIGH (ref 0.44–1.00)
GFR calc Af Amer: 57 mL/min — ABNORMAL LOW (ref >60)
GFR calc non Af Amer: 49 mL/min — ABNORMAL LOW (ref >60)
Glucose, Bld: 302 mg/dL — ABNORMAL HIGH (ref 70–99)
POTASSIUM: 4 mmol/L (ref 3.5–5.1)
Sodium: 136 mmol/L (ref 135–145)

## 2017-12-22 LAB — MAGNESIUM: Magnesium: 2.3 mg/dL (ref 1.7–2.4)

## 2017-12-22 LAB — GLUCOSE, RANDOM: GLUCOSE: 417 mg/dL — AB (ref 70–99)

## 2017-12-22 MED ORDER — IPRATROPIUM-ALBUTEROL 0.5-2.5 (3) MG/3ML IN SOLN
3.0000 mL | Freq: Two times a day (BID) | RESPIRATORY_TRACT | Status: DC
  Administered 2017-12-22 – 2017-12-24 (×4): 3 mL via RESPIRATORY_TRACT
  Filled 2017-12-22 (×4): qty 3

## 2017-12-22 MED ORDER — INSULIN ASPART 100 UNIT/ML ~~LOC~~ SOLN
0.0000 [IU] | Freq: Every day | SUBCUTANEOUS | Status: DC
  Administered 2017-12-22: 4 [IU] via SUBCUTANEOUS
  Administered 2017-12-23: 2 [IU] via SUBCUTANEOUS

## 2017-12-22 MED ORDER — TORSEMIDE 20 MG PO TABS
20.0000 mg | ORAL_TABLET | Freq: Every day | ORAL | Status: DC
  Administered 2017-12-23 – 2017-12-24 (×2): 20 mg via ORAL
  Filled 2017-12-22 (×2): qty 1

## 2017-12-22 MED ORDER — METHYLPREDNISOLONE SODIUM SUCC 125 MG IJ SOLR
60.0000 mg | Freq: Two times a day (BID) | INTRAMUSCULAR | Status: DC
  Administered 2017-12-23 – 2017-12-24 (×3): 60 mg via INTRAVENOUS
  Filled 2017-12-22 (×4): qty 2

## 2017-12-22 MED ORDER — INSULIN ASPART 100 UNIT/ML ~~LOC~~ SOLN
0.0000 [IU] | Freq: Three times a day (TID) | SUBCUTANEOUS | Status: DC
  Administered 2017-12-23: 15 [IU] via SUBCUTANEOUS
  Administered 2017-12-23: 11 [IU] via SUBCUTANEOUS
  Administered 2017-12-23: 15 [IU] via SUBCUTANEOUS
  Administered 2017-12-24 (×2): 7 [IU] via SUBCUTANEOUS

## 2017-12-22 MED ORDER — INSULIN GLARGINE 100 UNIT/ML ~~LOC~~ SOLN
8.0000 [IU] | Freq: Two times a day (BID) | SUBCUTANEOUS | Status: DC
  Administered 2017-12-22 – 2017-12-24 (×4): 8 [IU] via SUBCUTANEOUS
  Filled 2017-12-22 (×5): qty 0.08

## 2017-12-22 MED ORDER — INSULIN ASPART 100 UNIT/ML ~~LOC~~ SOLN
15.0000 [IU] | Freq: Once | SUBCUTANEOUS | Status: AC
  Administered 2017-12-22: 15 [IU] via SUBCUTANEOUS

## 2017-12-22 MED ORDER — INSULIN ASPART 100 UNIT/ML ~~LOC~~ SOLN
4.0000 [IU] | Freq: Three times a day (TID) | SUBCUTANEOUS | Status: DC
  Administered 2017-12-23 – 2017-12-24 (×5): 4 [IU] via SUBCUTANEOUS

## 2017-12-22 MED ORDER — BIOTENE DRY MOUTH MT LIQD: 15.0000 mL | OROMUCOSAL | Status: DC | PRN

## 2017-12-22 NOTE — Plan of Care (Signed)
Discussed plan of care for the evening with patient.  Emphasized pain management and using the call button when assistance is needed.  Good teach back displayed. 

## 2017-12-22 NOTE — Progress Notes (Signed)
PROGRESS NOTE  [name] SMO:[id number] DOB: Mar 21, 1951 DOA: 12/19/2017 PCP: [name], MD  HPI/Recap of past 24 hours:  Feeling better today, she did not want to wear bipap last night, This am she is on room air at rest She remain has some wheezing, but has improved since steroid dose increased, nasal congestion has improved Lower extremity edema has almost resolved Blood glucose elevated while on increase dose of steroids No fever Denies pain  Assessment/Plan: Principal Problem:   Acute bronchitis with asthma with acute exacerbation Active Problems:   Essential hypertension   Diabetes mellitus type 2, controlled ([place])   Upper airway cough syndrome vs Asthma    Acute respiratory failure with hypoxia (HCC)   Chronic diastolic CHF (congestive heart failure) ([facility])   Acute renal failure superimposed on stage 3 chronic kidney disease ([place])  Asthma exacerbation -she reports cough/wheezing/sob since Monday, she reports this year she has several hospitalization for asthma exacerbation, last in 09/2017 when she stayed for 13days, she denies h/o intubation -she is brought in by EMS, she received solumdrol/nebs enroute by EMS, she was put on cpap by EMS, she is transitioned to bipap in the ED due to significant wheezing, tachypnea -initial abg ph wnl, negative ddimer -initial cxr " Left base atelectasis. No edema or consolidation. Stable cardiac silhouette" -mrsa screening negative -respiratory viral panel + rhinovirus, urine strep pneumo negative, urine legionella antigen negative -wheezing has improved on increase steroids and nebs frequency, continue mucinex,  singulair,   -wean steroids  Acute on chronic diastolic chf -she has bilateral lower extremity pitting edem, she reports weight gain of 4lbs in the last few days -negative troponin, bmp 34 -home meds demadex held since admission,  iv lasix ( concerning for malobsoprtion of oral diuretics in the setting of chf  exacerbation) -daily weight, strict intake and output -edema has almost resolved, transition from iv lasix to home meds demadex  AKI on CKD III -bun/cr on presentation is 44/2.01 -ua unremarkable -home meds metformin held -she appear in chf exacerbation with lower extremity edema and weight gain, she received iv lasix,  -cr improving, bun/cr 28/1.12 on 8/24, close monitor cr  HTN;  Home meds nebivolol/cardizem/hydralazine continued -iv lasix stopped, home meds demadex restarted -taper steroids  noninsulin dependent dm2 with peripheral neuropathy Last a1c 7.3% in 09/2017 Expect elevated blood glucose while on iv steroids Home meds metformin held, on ssi, add meal coverage and start lantus now that she needs higher dose of iv steroids, wean steroids Continue neurontin    CAD s/p stent Denies chest pain, negative troponin Continue nebivolol, statin, asa   H/o seizures Stable on depakote 1000mg  qhs  Body mass index is 30.36 kg/m.   Tele has been unremarkable, patient clinically is improving, will d/c tele  Code Status: full  Family Communication: patient   Disposition Plan: home in 1-2 days pending clinical improvement, need to be able to tolerate steroids taper   Consultants:  none  Procedures:  bipap  Antibiotics:  none   Objective: BP (!) 162/83 (BP Location: Left Arm)   Pulse 76   Temp 98.6 F (37 C) (Oral)   Resp 20   Ht 5\' 2"  (1.575 m)   Wt 75.3 kg   LMP  (LMP Unknown)   SpO2 99%   BMI 30.36 kg/m   Intake/Output Summary (Last 24 hours) at 12/22/2017 0721 Last data filed at 12/21/2017 0853 Gross per 24 hour  Intake 240 ml  Output -  Net 240 ml  Filed Weights   12/20/17 0235 12/21/17 0500 12/22/17 0021  Weight: 75.9 kg 75.8 kg 75.3 kg    Exam: Patient is examined daily including today on 12/22/2017, exams remain the same as of yesterday except that has changed    General:  NAD  Cardiovascular: RRR  Respiratory: mild  bilateral  wheezing  Abdomen: Soft/ND/NT, positive BS  Musculoskeletal: bilateral lower extremity Edema has almost resolved   Neuro: alert, oriented   Data Reviewed: Basic Metabolic Panel: Recent Labs  Lab 12/19/17 1750 12/20/17 0346 12/21/17 0232 12/22/17 0232  NA 135 134* 138 136  K 4.5 4.6 4.1 4.0  CL 97* 96* 100 99  CO2 23 23 28 25   GLUCOSE 216* 287* 327* 302*  BUN 44* 46* 40* 28*  CREATININE 2.01* 1.93* 1.32* 1.12*  CALCIUM 8.1* 7.8* 8.3* 8.4*  MG  --   --  2.2 2.3   Liver Function Tests: Recent Labs  Lab 12/21/17 0232  AST 16  ALT 15  ALKPHOS 41  BILITOT 0.5  PROT 6.7  ALBUMIN 3.5   No results for input(s): LIPASE, AMYLASE in the last 168 hours. No results for input(s): AMMONIA in the last 168 hours. CBC: Recent Labs  Lab 12/19/17 1750 12/21/17 0232 12/22/17 0232  WBC 13.1* 12.0* 12.0*  NEUTROABS 10.7* 10.1*  --   HGB 10.9* 10.4* 10.9*  HCT 34.3* 32.1* 33.7*  MCV 99.1 98.2 97.4  PLT 309 287 292   Cardiac Enzymes:   No results for input(s): CKTOTAL, CKMB, CKMBINDEX, TROPONINI in the last 168 hours. BNP (last 3 results) Recent Labs    08/12/17 0544 10/05/17 1625 12/20/17 0028  BNP 46.4 30.0 34.1    ProBNP (last 3 results) No results for input(s): PROBNP in the last 8760 hours.  CBG: Recent Labs  Lab 12/20/17 2200 12/21/17 0728 12/21/17 1217 12/21/17 1700 12/21/17 2105  GLUCAP 262* 253* 228* 281* 349*    Recent Results (from the past 240 hour(s))  MRSA PCR Screening     Status: None   Collection Time: 12/20/17  6:37 AM  Result Value Ref Range Status   MRSA by PCR NEGATIVE NEGATIVE Final    Comment:        The GeneXpert MRSA Assay (FDA approved for NASAL specimens only), is one component of a comprehensive MRSA colonization surveillance program. It is not intended to diagnose MRSA infection nor to guide or monitor treatment for MRSA infections. Performed at [facility], [place] [address], [place], [place] [address]   Respiratory  Panel by PCR     Status: Abnormal   Collection Time: 12/20/17  2:42 PM  Result Value Ref Range Status   Adenovirus NOT DETECTED NOT DETECTED Final   Coronavirus 229E NOT DETECTED NOT DETECTED Final   Coronavirus HKU1 NOT DETECTED NOT DETECTED Final   Coronavirus NL63 NOT DETECTED NOT DETECTED Final   Coronavirus OC43 NOT DETECTED NOT DETECTED Final   Metapneumovirus NOT DETECTED NOT DETECTED Final   Rhinovirus / Enterovirus DETECTED (A) NOT DETECTED Final   Influenza A NOT DETECTED NOT DETECTED Final   Influenza B NOT DETECTED NOT DETECTED Final   Parainfluenza Virus 1 NOT DETECTED NOT DETECTED Final   Parainfluenza Virus 2 NOT DETECTED NOT DETECTED Final   Parainfluenza Virus 3 NOT DETECTED NOT DETECTED Final   Parainfluenza Virus 4 NOT DETECTED NOT DETECTED Final   Respiratory Syncytial Virus NOT DETECTED NOT DETECTED Final   Bordetella pertussis NOT DETECTED NOT DETECTED Final   Chlamydophila pneumoniae  NOT DETECTED NOT DETECTED Final   Mycoplasma pneumoniae NOT DETECTED NOT DETECTED Final    Comment: Performed at [facility], [place] [address], [place], [place] [address]     Studies: No results found.  Scheduled Meds: . arformoterol  15 mcg Nebulization BID  . aspirin EC  81 mg Oral Daily  . atorvastatin  40 mg Oral Daily  . budesonide  0.5 mg Nebulization BID  . diltiazem  240 mg Oral Daily  . divalproex  1,000 mg Oral QHS  . enoxaparin (LOVENOX) injection  30 mg Subcutaneous QHS  . estradiol  1 mg Oral Daily  . fluticasone  2 spray Each Nare Daily  . furosemide  40 mg Intravenous Daily  . gabapentin  100 mg Oral TID  . guaiFENesin  600 mg Oral BID  . hydrALAZINE  25 mg Oral BID  . insulin aspart  0-9 Units Subcutaneous TID WC  . insulin aspart  3 Units Subcutaneous TID WC  . insulin glargine  5 Units Subcutaneous BID  . ipratropium-albuterol  3 mL Nebulization Q6H  . methylPREDNISolone (SOLU-MEDROL) injection  60 mg Intravenous Q6H  . montelukast  10 mg Oral  Daily  . nebivolol  5 mg Oral Daily  . pantoprazole  80 mg Oral Q1200    Continuous Infusions:   Time spent: 22mins I have personally reviewed and interpreted on  12/22/2017 daily labs, tele strips, imagings as discussed above under date review session and assessment and plans.  I reviewed all nursing notes, pharmacy notes,  vitals, pertinent old records  I have discussed plan of care as described above with RN , patient  on 12/22/2017   Florencia Reasons MD, PhD  [facility] Pager [phone]. If 7PM-7AM, please contact night-coverage at [email or web address], password [email or web address] 12/22/2017, 7:21 AM  LOS: 2 days

## 2017-12-23 LAB — CBC WITH DIFFERENTIAL/PLATELET
Abs Immature Granulocytes: 0.3 10*3/uL — ABNORMAL HIGH (ref 0.0–0.1)
BASOS ABS: 0 10*3/uL (ref 0.0–0.1)
Basophils Relative: 0 %
EOS ABS: 0 10*3/uL (ref 0.0–0.7)
EOS PCT: 0 %
HCT: 33.1 % — ABNORMAL LOW (ref 36.0–46.0)
Hemoglobin: 10.8 g/dL — ABNORMAL LOW (ref 12.0–15.0)
Immature Granulocytes: 3 %
Lymphocytes Relative: 11 %
Lymphs Abs: 1.3 10*3/uL (ref 0.7–4.0)
MCH: 31.1 pg (ref 26.0–34.0)
MCHC: 32.6 g/dL (ref 30.0–36.0)
MCV: 95.4 fL (ref 78.0–100.0)
Monocytes Absolute: 0.9 10*3/uL (ref 0.1–1.0)
Monocytes Relative: 7 %
Neutro Abs: 9.8 10*3/uL — ABNORMAL HIGH (ref 1.7–7.7)
Neutrophils Relative %: 79 %
Platelets: 314 10*3/uL (ref 150–400)
RBC: 3.47 MIL/uL — ABNORMAL LOW (ref 3.87–5.11)
RDW: 13.2 % (ref 11.5–15.5)
WBC: 12.3 10*3/uL — ABNORMAL HIGH (ref 4.0–10.5)

## 2017-12-23 LAB — BASIC METABOLIC PANEL
Anion gap: 10 (ref 5–15)
BUN: 30 mg/dL — ABNORMAL HIGH (ref 8–23)
CALCIUM: 8.5 mg/dL — AB (ref 8.9–10.3)
CO2: 29 mmol/L (ref 22–32)
Chloride: 96 mmol/L — ABNORMAL LOW (ref 98–111)
Creatinine, Ser: 1.34 mg/dL — ABNORMAL HIGH (ref 0.44–1.00)
GFR calc non Af Amer: 39 mL/min — ABNORMAL LOW (ref >60)
GFR, EST AFRICAN AMERICAN: 46 mL/min — AB (ref >60)
Glucose, Bld: 247 mg/dL — ABNORMAL HIGH (ref 70–99)
Potassium: 3.8 mmol/L (ref 3.5–5.1)
SODIUM: 135 mmol/L (ref 135–145)

## 2017-12-23 LAB — GLUCOSE, CAPILLARY
GLUCOSE-CAPILLARY: 267 mg/dL — AB (ref 70–99)
GLUCOSE-CAPILLARY: 203 mg/dL — AB (ref 70–99)
Glucose-Capillary: 340 mg/dL — ABNORMAL HIGH (ref 70–99)
Glucose-Capillary: 332 mg/dL — ABNORMAL HIGH (ref 70–99)

## 2017-12-23 LAB — MAGNESIUM: MAGNESIUM: 2.3 mg/dL (ref 1.7–2.4)

## 2017-12-23 NOTE — Progress Notes (Signed)
PROGRESS NOTE  SHONDRA CAPPS DXA:[id number] DOB: 08/06/1950 DOA: 12/19/2017 PCP: [name], MD  HPI/Recap of past 24 hours:  Continue to improve, she did not need bipap last night, she is on room air at rest She still wheeze, but improving as well No edema today No fever Denies pain  Blood glucose elevated while on increase dose of steroids   Assessment/Plan: Principal Problem:   Acute bronchitis with asthma with acute exacerbation Active Problems:   Essential hypertension   Diabetes mellitus type 2, controlled ([place])   Upper airway cough syndrome vs Asthma    Acute respiratory failure with hypoxia (HCC)   Chronic diastolic CHF (congestive heart failure) (HCC)   Acute renal failure superimposed on stage 3 chronic kidney disease (HCC)  Asthma exacerbation -she reports cough/wheezing/sob since Monday, she reports this year she has several hospitalization for asthma exacerbation, last in 09/2017 when she stayed for 13days, she denies h/o intubation -she is brought in by EMS, she received solumdrol/nebs enroute by EMS, she was put on cpap by EMS, she is transitioned to bipap in the ED due to significant wheezing, tachypnea -initial abg ph wnl, negative ddimer -initial cxr " Left base atelectasis. No edema or consolidation. Stable cardiac silhouette" -mrsa screening negative -respiratory viral panel + rhinovirus, urine strep pneumo negative, urine legionella antigen negative -wheezing has improved on increase steroids and nebs frequency, continue mucinex,  singulair,   -continue to wean steroids, ambulate  Acute on chronic diastolic chf -she has bilateral lower extremity pitting edem, she reports weight gain of 4lbs in the last few days -negative troponin, bmp 34 -home meds demadex held since admission,  iv lasix ( concerning for malobsoprtion of oral diuretics in the setting of chf exacerbation) -daily weight, strict intake and output -edema has almost resolved, transition  from iv lasix to home meds demadex  AKI on CKD III -bun/cr on presentation is 44/2.01 -ua unremarkable -home meds metformin held -she appear in chf exacerbation with lower extremity edema and weight gain, she received iv lasix,  -cr improving, bun/cr 28/1.12 on 8/24, close monitor cr  HTN;  Home meds nebivolol/cardizem/hydralazine continued -iv lasix stopped, home meds demadex restarted -taper steroids  noninsulin dependent dm2 with peripheral neuropathy Last a1c 7.3% in 09/2017 Expect elevated blood glucose while on iv steroids Home meds metformin held, on ssi, add meal coverage and start lantus now that she needs higher dose of iv steroids, Remain elevated, change ssi scale to resistance,  wean steroids Continue neurontin    CAD s/p stent Denies chest pain, negative troponin Continue nebivolol, statin, asa   H/o seizures Stable on depakote 1000mg  qhs  Body mass index is 30.52 kg/m.    Code Status: full  Family Communication: patient   Disposition Plan: home in 1-2 days pending clinical improvement, need to be able to tolerate steroids taper   Consultants:  none  Procedures:  bipap  Antibiotics:  none   Objective: BP (!) 170/81 (BP Location: Left Arm)   Pulse 81   Temp 98 F (36.7 C) (Oral)   Resp 20   Ht 5\' 2"  (1.575 m)   Wt 75.7 kg   LMP  (LMP Unknown)   SpO2 99%   BMI 30.52 kg/m  No intake or output data in the 24 hours ending 12/23/17 0942 Filed Weights   12/22/17 0021 12/22/17 0500 12/23/17 0400  Weight: 75.3 kg 76.1 kg 75.7 kg    Exam: Patient is examined daily including today on 12/23/2017, exams remain  the same as of yesterday except that has changed    General:  NAD  Cardiovascular: RRR  Respiratory: persistent mild  bilateral wheezing  Abdomen: Soft/ND/NT, positive BS  Musculoskeletal: bilateral lower extremity Edema has almost resolved   Neuro: alert, oriented   Data Reviewed: Basic Metabolic Panel: Recent Labs  Lab  12/19/17 1750 12/20/17 0346 12/21/17 0232 12/22/17 0232 12/22/17 1828 12/23/17 0239  NA 135 134* 138 136  --  135  K 4.5 4.6 4.1 4.0  --  3.8  CL 97* 96* 100 99  --  96*  CO2 23 23 28 25   --  29  GLUCOSE 216* 287* 327* 302* 417* 247*  BUN 44* 46* 40* 28*  --  30*  CREATININE 2.01* 1.93* 1.32* 1.12*  --  1.34*  CALCIUM 8.1* 7.8* 8.3* 8.4*  --  8.5*  MG  --   --  2.2 2.3  --  2.3   Liver Function Tests: Recent Labs  Lab 12/21/17 0232  AST 16  ALT 15  ALKPHOS 41  BILITOT 0.5  PROT 6.7  ALBUMIN 3.5   No results for input(s): LIPASE, AMYLASE in the last 168 hours. No results for input(s): AMMONIA in the last 168 hours. CBC: Recent Labs  Lab 12/19/17 1750 12/21/17 0232 12/22/17 0232 12/23/17 0239  WBC 13.1* 12.0* 12.0* 12.3*  NEUTROABS 10.7* 10.1*  --  9.8*  HGB 10.9* 10.4* 10.9* 10.8*  HCT 34.3* 32.1* 33.7* 33.1*  MCV 99.1 98.2 97.4 95.4  PLT 309 287 292 314   Cardiac Enzymes:   No results for input(s): CKTOTAL, CKMB, CKMBINDEX, TROPONINI in the last 168 hours. BNP (last 3 results) Recent Labs    08/12/17 0544 10/05/17 1625 12/20/17 0028  BNP 46.4 30.0 34.1    ProBNP (last 3 results) No results for input(s): PROBNP in the last 8760 hours.  CBG: Recent Labs  Lab 12/22/17 1144 12/22/17 1639 12/22/17 1806 12/22/17 2049 12/23/17 0737  GLUCAP 296* 421* 416* 303* 267*    Recent Results (from the past 240 hour(s))  MRSA PCR Screening     Status: None   Collection Time: 12/20/17  6:37 AM  Result Value Ref Range Status   MRSA by PCR NEGATIVE NEGATIVE Final    Comment:        The GeneXpert MRSA Assay (FDA approved for NASAL specimens only), is one component of a comprehensive MRSA colonization surveillance program. It is not intended to diagnose MRSA infection nor to guide or monitor treatment for MRSA infections. Performed at [facility], [place] [address] [place], [place] [address]   Respiratory Panel by PCR     Status: Abnormal    Collection Time: 12/20/17  2:42 PM  Result Value Ref Range Status   Adenovirus NOT DETECTED NOT DETECTED Final   Coronavirus 229E NOT DETECTED NOT DETECTED Final   Coronavirus HKU1 NOT DETECTED NOT DETECTED Final   Coronavirus NL63 NOT DETECTED NOT DETECTED Final   Coronavirus OC43 NOT DETECTED NOT DETECTED Final   Metapneumovirus NOT DETECTED NOT DETECTED Final   Rhinovirus / Enterovirus DETECTED (A) NOT DETECTED Final   Influenza A NOT DETECTED NOT DETECTED Final   Influenza B NOT DETECTED NOT DETECTED Final   Parainfluenza Virus 1 NOT DETECTED NOT DETECTED Final   Parainfluenza Virus 2 NOT DETECTED NOT DETECTED Final   Parainfluenza Virus 3 NOT DETECTED NOT DETECTED Final   Parainfluenza Virus 4 NOT DETECTED NOT DETECTED Final   Respiratory Syncytial Virus NOT DETECTED NOT  DETECTED Final   Bordetella pertussis NOT DETECTED NOT DETECTED Final   Chlamydophila pneumoniae NOT DETECTED NOT DETECTED Final   Mycoplasma pneumoniae NOT DETECTED NOT DETECTED Final    Comment: Performed at [facility], [place] [address], [place], [place] [address]     Studies: No results found.  Scheduled Meds: . arformoterol  15 mcg Nebulization BID  . aspirin EC  81 mg Oral Daily  . atorvastatin  40 mg Oral Daily  . budesonide  0.5 mg Nebulization BID  . diltiazem  240 mg Oral Daily  . divalproex  1,000 mg Oral QHS  . enoxaparin (LOVENOX) injection  30 mg Subcutaneous QHS  . estradiol  1 mg Oral Daily  . fluticasone  2 spray Each Nare Daily  . gabapentin  100 mg Oral TID  . guaiFENesin  600 mg Oral BID  . hydrALAZINE  25 mg Oral BID  . insulin aspart  0-20 Units Subcutaneous TID WC  . insulin aspart  0-5 Units Subcutaneous QHS  . insulin aspart  4 Units Subcutaneous TID WC  . insulin glargine  8 Units Subcutaneous BID  . ipratropium-albuterol  3 mL Nebulization BID  . methylPREDNISolone (SOLU-MEDROL) injection  60 mg Intravenous Q12H  . montelukast  10 mg Oral Daily  . nebivolol  5 mg Oral  Daily  . pantoprazole  80 mg Oral Q1200  . torsemide  20 mg Oral Daily    Continuous Infusions:   Time spent: 70mins I have personally reviewed and interpreted on  12/23/2017 daily labs,  imagings as discussed above under date review session and assessment and plans.  I reviewed all nursing notes, pharmacy notes,  vitals, pertinent old records  I have discussed plan of care as described above with RN , patient  on 12/23/2017   Florencia Reasons MD, PhD  [facility] Pager [phone]. If 7PM-7AM, please contact night-coverage at [email or web address], password [facility] 12/23/2017, 9:42 AM  LOS: 3 days

## 2017-12-23 NOTE — Plan of Care (Signed)
Discussed plan of care for the evening with patient.  Emphasized pain management and using the call button when assistance is needed.  Good teach back displayed. 

## 2017-12-24 LAB — CBC
HCT: 32.2 % — ABNORMAL LOW (ref 36.0–46.0)
Hemoglobin: 10.8 g/dL — ABNORMAL LOW (ref 12.0–15.0)
MCH: 31.9 pg (ref 26.0–34.0)
MCHC: 33.5 g/dL (ref 30.0–36.0)
MCV: 95 fL (ref 78.0–100.0)
Platelets: 323 10*3/uL (ref 150–400)
RBC: 3.39 MIL/uL — ABNORMAL LOW (ref 3.87–5.11)
RDW: 13.1 % (ref 11.5–15.5)
WBC: 14.1 10*3/uL — ABNORMAL HIGH (ref 4.0–10.5)

## 2017-12-24 LAB — BASIC METABOLIC PANEL
Anion gap: 10 (ref 5–15)
BUN: 38 mg/dL — ABNORMAL HIGH (ref 8–23)
CHLORIDE: 94 mmol/L — AB (ref 98–111)
CO2: 30 mmol/L (ref 22–32)
CREATININE: 1.35 mg/dL — AB (ref 0.44–1.00)
Calcium: 8.2 mg/dL — ABNORMAL LOW (ref 8.9–10.3)
GFR calc non Af Amer: 39 mL/min — ABNORMAL LOW (ref >60)
GFR, EST AFRICAN AMERICAN: 45 mL/min — AB (ref >60)
Glucose, Bld: 189 mg/dL — ABNORMAL HIGH (ref 70–99)
Potassium: 4.2 mmol/L (ref 3.5–5.1)
Sodium: 134 mmol/L — ABNORMAL LOW (ref 135–145)

## 2017-12-24 LAB — GLUCOSE, CAPILLARY
Glucose-Capillary: 223 mg/dL — ABNORMAL HIGH (ref 70–99)
Glucose-Capillary: 249 mg/dL — ABNORMAL HIGH (ref 70–99)

## 2017-12-24 MED ORDER — BUDESONIDE 0.5 MG/2ML IN SUSP: 0.5000 mg | Freq: Two times a day (BID) | RESPIRATORY_TRACT | 0 refills | Status: DC

## 2017-12-24 MED ORDER — PREDNISONE 10 MG PO TABS: ORAL_TABLET | ORAL | 0 refills | Status: DC

## 2017-12-24 MED ORDER — GUAIFENESIN ER 600 MG PO TB12: 600.0000 mg | ORAL_TABLET | Freq: Two times a day (BID) | ORAL | 0 refills | Status: DC

## 2017-12-24 NOTE — Progress Notes (Signed)
Chart and office note reviewed in detail  > agree with a/p as outlined    

## 2017-12-24 NOTE — Progress Notes (Signed)
Inpatient Diabetes Program Recommendations  AACE/ADA: New Consensus Statement on Inpatient Glycemic Control (2019)  Target Ranges:  Prepandial:   less than 140 mg/dL      Peak postprandial:   less than 180 mg/dL (1-2 hours)      Critically ill patients:  140 - 180 mg/dL   Results for [name], [name] (MRN [id number]) as of 12/24/2017 08:21  Ref. Range 12/23/2017 07:37 12/23/2017 11:50 12/23/2017 17:05 12/23/2017 20:46 12/24/2017 07:23  Glucose-Capillary Latest Ref Range: 70 - 99 mg/dL 267 (H) 340 (H) 332 (H) 203 (H) 223 (H)   Review of Glycemic Control  Diabetes history: DM2 Outpatient Diabetes medications: Metformin 1000 mg BID Current orders for Inpatient glycemic control: Lantus 8 units BID, Novolog 0-20 units TID with meals, Novolog 0-5 units QHS, Novolog 4 units TID with meals for meal coverage; Solumedrol 60 mg Q12H  Inpatient Diabetes Program Recommendations: Insulin - Basal: If steroids are continued, please increasing Lantus 12 units BID. Insulin - Meal Coverage: If steroids are continued, please consider increasing meal coverage to Novolog 8 units TID with meals if patient eats at least 50% of meals.  Thanks, Barnie Alderman, RN, MSN, CDE Diabetes Coordinator Inpatient Diabetes Program [phone] (Team Pager from 8am to 5pm)

## 2017-12-24 NOTE — Discharge Summary (Signed)
Discharge Summary  [name]:[id number] DOB: [birth year]  PCP: [name], MD  Admit date: 12/19/2017 Discharge date: 12/24/2017  Time spent: 69mins  Recommendations for Outpatient Follow-up:  1. F/u with PMD within a week  for hospital discharge follow up, repeat cbc/bmp at follow up 2. F/u with pulmonology Dr Melvyn Novas on 8/30  D/c home with steroids taper, monitor blood glucose while on steroids She reports run out of pulmicort nebs, new prescription provided, she is to follow up with Dr Melvyn Novas to further discuss asthma/copd meds, she is concerned about cost of these meds  Discharge Diagnoses:  Active Hospital Problems   Diagnosis Date Noted  . Acute bronchitis with asthma with acute exacerbation 01/16/2016  . Acute renal failure superimposed on stage 3 chronic kidney disease ([place]) 07/17/2016  . Chronic diastolic CHF (congestive heart failure) ([place]) 07/17/2016  . Acute respiratory failure with hypoxia ([place])   . Upper airway cough syndrome vs Asthma  08/28/2014  . Diabetes mellitus type 2, controlled ([place]) 08/06/2014  . Essential hypertension 07/21/2008    Resolved Hospital Problems  No resolved problems to display.    Discharge Condition: stable  Diet recommendation: heart healthy/carb modified  Filed Weights   12/22/17 0500 12/23/17 0400 12/24/17 0724  Weight: 76.1 kg 75.7 kg 75.2 kg    History of present illness: (per admitting provider Dr Alcario Drought) PCP: [name], MD  Patient coming from: Home  I have personally briefly reviewed patient's old medical records in [place]  Chief Complaint: Respiratory distress  HPI: [name] is a 67 y.o. female with medical history significant of CAD, chronic grade 2 diastolic CHF, DM, HTN, prior-PE, recurrent admissions for asthma exacerbations vs upper airway cough syndrome.  Patient developed SOB onset Monday, associated wheezing.  This worsened despite home nebs.  EMS called for respiratory  distress.  Patient got albuterol and solumedrol en-route to ED.  Arrives to ED on CPAP.    Feels like her typical asthma symptoms she says.   ED Course: Patient given further neb treatments, magnesium, and put on BIPAP.  Productive cough since Monday but no fever.   Hospital Course:  Principal Problem:   Acute bronchitis with asthma with acute exacerbation Active Problems:   Essential hypertension   Diabetes mellitus type 2, controlled ([place])   Upper airway cough syndrome vs Asthma    Acute respiratory failure with hypoxia (HCC)   Chronic diastolic CHF (congestive heart failure) (HCC)   Acute renal failure superimposed on stage 3 chronic kidney disease (HCC)   Asthma /COPD exacerbation -quit smoking several yrs ago ( prior to that she smoked cigarette for about 59yrs) -she reports cough/wheezing/sob since Monday, she reports this year she has several hospitalization for asthma exacerbation, last in 09/2017 when she stayed for 13days, she denies h/o intubation -she is brought in by EMS, she received solumdrol/nebs enroute by EMS, she was put on cpap by EMS, she is transitioned to bipap in the ED due to significant wheezing, tachypnea -initial abg ph wnl, negative ddimer -initial cxr " Left base atelectasis. No edema or consolidation. Stable cardiac silhouette" -mrsa screening negative -respiratory viral panel + rhinovirus, urine strep pneumo negative, urine legionella antigen negative -wheezing has resolved, she is discharged on prednisone taper, continue mucinex,  singulair,   -she is to follow up with pulmonology Dr Melvyn Novas  Acute on chronic diastolic chf -she has bilateral lower extremity pitting edem, she reports weight gain of 4lbs in the last few days -negative troponin, bmp  87 -home meds demadex held since admission,  iv lasix ( concerning for malobsoprtion of oral diuretics in the setting of chf exacerbation) -daily weight, strict intake and output -edema has almost  resolved, transition from iv lasix to home meds demadex  AKI on CKD III -bun/cr on presentation is 44/2.01 -ua unremarkable -has back to baseline. -home meds metformin held in the hospital ,resumed at discharge.  HTN;  -Home meds nebivolol/cardizem/hydralazine continued -iv lasix stopped, home meds demadex restarted -taper steroids  noninsulin dependent dm2 with peripheral neuropathy Last a1c 7.3% in 09/2017 Her blood glucose was elevated in the hospital while on steroids, she required insulin in the hospital ( lantus and novolog) -steroid dose decreased, she is discharged on steroids taper, Home meds metformin held in the hospital, resumed at discharge -continue neurontin    CAD s/p stent Denies chest pain, negative troponin Continue nebivolol, statin, asa   H/o seizures Stable on depakote 1000mg  qhs  Body mass index is 30.52 kg/m.    Code Status: full  Family Communication: patient   Disposition Plan: home    Consultants:  none  Procedures:  bipap  Antibiotics:  none  Discharge Exam: BP (!) 140/95   Pulse 68   Temp 98.1 F (36.7 C) (Oral)   Resp 18   Ht 5\' 2"  (1.575 m)   Wt 75.2 kg   LMP  (LMP Unknown)   SpO2 100%   BMI 30.32 kg/m   General: NAD Cardiovascular: RRR Respiratory: CTABL  Discharge Instructions You were cared for by a hospitalist during your hospital stay. If you have any questions about your discharge medications or the care you received while you were in the hospital after you are discharged, you can call the unit and asked to speak with the hospitalist on call if the hospitalist that took care of you is not available. Once you are discharged, your primary care physician will handle any further medical issues. Please note that NO REFILLS for any discharge medications will be authorized once you are discharged, as it is imperative that you return to your primary care physician (or establish a relationship with a  primary care physician if you do not have one) for your aftercare needs so that they can reassess your need for medications and monitor your lab values.  Discharge Instructions    Diet - low sodium heart healthy   Complete by:  As directed    Carb modified diet   Increase activity slowly   Complete by:  As directed      Allergies as of 12/24/2017      Reactions   Amlodipine Besylate Other (See Comments)   Reaction unknown   Lisinopril Other (See Comments)   Reaction unknown   Pantoprazole Sodium Other (See Comments)   Reaction unknown      Medication List    TAKE these medications   albuterol 108 (90 Base) MCG/ACT inhaler Commonly known as:  PROVENTIL HFA;VENTOLIN HFA Inhale 2 puffs into the lungs every 4 (four) hours as needed for wheezing or shortness of breath. What changed:  Another medication with the same name was changed. Make sure you understand how and when to take each.   albuterol (2.5 MG/3ML) 0.083% nebulizer solution Commonly known as:  PROVENTIL Take 3 mLs (2.5 mg total) by nebulization 3 (three) times daily. And every 4 hours as needed What changed:    when to take this  reasons to take this   ALPRAZolam 0.5 MG tablet Commonly known as:  XANAX Take 1 tablet (0.5 mg total) by mouth 2 (two) times daily as needed for anxiety.   arformoterol 15 MCG/2ML Nebu Commonly known as:  BROVANA Take 2 mLs (15 mcg total) by nebulization 2 (two) times daily.   aspirin EC 81 MG tablet Take 81 mg by mouth daily.   atorvastatin 40 MG tablet Commonly known as:  LIPITOR Take 40 mg by mouth daily.   Biotin 5000 MCG Caps Take 1 capsule by mouth daily.   budesonide 0.5 MG/2ML nebulizer solution Commonly known as:  PULMICORT Take 2 mLs (0.5 mg total) by nebulization 2 (two) times daily.   diltiazem 240 MG 24 hr capsule Commonly known as:  CARDIZEM CD Take 1 capsule (240 mg total) by mouth daily.   divalproex 500 MG 24 hr tablet Commonly known as:  DEPAKOTE  ER Take 2 tablets (1,000 mg total) by mouth at bedtime.   esomeprazole 40 MG capsule Commonly known as:  NEXIUM Take 1 capsule (40 mg total) by mouth 2 (two) times daily before a meal.   estradiol 1 MG tablet Commonly known as:  ESTRACE Take 1 mg by mouth daily.   fluticasone 50 MCG/ACT nasal spray Commonly known as:  FLONASE Place 2 sprays into both nostrils daily.   gabapentin 100 MG capsule Commonly known as:  NEURONTIN Take 1 capsule (100 mg total) by mouth 3 (three) times daily.   guaiFENesin 600 MG 12 hr tablet Commonly known as:  MUCINEX Take 1 tablet (600 mg total) by mouth 2 (two) times daily.   hydrALAZINE 25 MG tablet Commonly known as:  APRESOLINE Take 1 tablet (25 mg total) by mouth 2 (two) times daily.   HYDROcodone-homatropine 5-1.5 MG/5ML syrup Commonly known as:  HYCODAN Take 5 mLs by mouth every 6 (six) hours as needed for cough.   metFORMIN 1000 MG tablet Commonly known as:  GLUCOPHAGE Take 1 tablet (1,000 mg total) by mouth 2 (two) times daily with a meal.   montelukast 10 MG tablet Commonly known as:  SINGULAIR Take 1 tablet (10 mg total) by mouth daily.   nebivolol 5 MG tablet Commonly known as:  BYSTOLIC Take 5 mg by mouth daily.   nitroGLYCERIN 0.4 MG SL tablet Commonly known as:  NITROSTAT Place 1 tablet (0.4 mg total) under the tongue every 5 (five) minutes as needed for chest pain. x3 as needed   potassium chloride SA 20 MEQ tablet Commonly known as:  K-DUR,KLOR-CON Take 20 mEq by mouth daily.   predniSONE 10 MG tablet Commonly known as:  DELTASONE Label  & dispense according to the schedule below. 6 Pills PO on day one then, 5 Pills PO on day two, 4 Pills PO on day three, 3Pills PO on day four, 2 Pills PO on day five, 1 Pills PO on day six,  then STOP.  Total of 21 tabs   torsemide 20 MG tablet Commonly known as:  DEMADEX Take 20 mg by mouth daily.   traMADol 50 MG tablet Commonly known as:  ULTRAM Take 1 tablet (50 mg total)  by mouth every 6 (six) hours as needed for up to 7 days.      Allergies  Allergen Reactions  . Amlodipine Besylate Other (See Comments)    Reaction unknown  . Lisinopril Other (See Comments)    Reaction unknown  . Pantoprazole Sodium Other (See Comments)    Reaction unknown   Follow-up Information    [name], MD Follow up in 1 week(s).   Specialty:  Family Medicine Why:  hospital discharge follow up, repeat cbc/bmp at follow up, please monitor blood sugar closely while your are on prednisone taper,  Contact information: [place] [address] [phone]        Jerline Pain, MD .   Specialty:  Cardiology Contact information: [address] [place] [address] [phone]        Tanda Rockers, MD Follow up on 12/28/2017.   Specialty:  Pulmonary Disease Why:  copd /asthma Contact information: [address] [place] [address] [phone]            The results of significant diagnostics from this hospitalization (including imaging, microbiology, ancillary and laboratory) are listed below for reference.    Significant Diagnostic Studies: Dg Chest Portable 1 View  Result Date: 12/19/2017 CLINICAL DATA:  Shortness of breath EXAM: PORTABLE CHEST 1 VIEW COMPARISON:  October 09, 2017 FINDINGS: There is atelectatic change in the left base. Lungs elsewhere are clear. Heart is upper normal in size with pulmonary vascularity normal. No adenopathy. No bone lesions. IMPRESSION: Left base atelectasis. No edema or consolidation. Stable cardiac silhouette. Electronically Signed   By: Lowella Grip III M.D.   On: 12/19/2017 18:20    Microbiology: Recent Results (from the past 240 hour(s))  MRSA PCR Screening     Status: None   Collection Time: 12/20/17  6:37 AM  Result Value Ref Range Status   MRSA by PCR NEGATIVE NEGATIVE Final    Comment:        The GeneXpert MRSA Assay (FDA approved for NASAL  specimens only), is one component of a comprehensive MRSA colonization surveillance program. It is not intended to diagnose MRSA infection nor to guide or monitor treatment for MRSA infections. Performed at [facility], [place] [address], [place], [place] [address]   Respiratory Panel by PCR     Status: Abnormal   Collection Time: 12/20/17  2:42 PM  Result Value Ref Range Status   Adenovirus NOT DETECTED NOT DETECTED Final   Coronavirus 229E NOT DETECTED NOT DETECTED Final   Coronavirus HKU1 NOT DETECTED NOT DETECTED Final   Coronavirus NL63 NOT DETECTED NOT DETECTED Final   Coronavirus OC43 NOT DETECTED NOT DETECTED Final   Metapneumovirus NOT DETECTED NOT DETECTED Final   Rhinovirus / Enterovirus DETECTED (A) NOT DETECTED Final   Influenza A NOT DETECTED NOT DETECTED Final   Influenza B NOT DETECTED NOT DETECTED Final   Parainfluenza Virus 1 NOT DETECTED NOT DETECTED Final   Parainfluenza Virus 2 NOT DETECTED NOT DETECTED Final   Parainfluenza Virus 3 NOT DETECTED NOT DETECTED Final   Parainfluenza Virus 4 NOT DETECTED NOT DETECTED Final   Respiratory Syncytial Virus NOT DETECTED NOT DETECTED Final   Bordetella pertussis NOT DETECTED NOT DETECTED Final   Chlamydophila pneumoniae NOT DETECTED NOT DETECTED Final   Mycoplasma pneumoniae NOT DETECTED NOT DETECTED Final    Comment: Performed at [facility], [place] [address] [place], [place] [address]     Labs: Basic Metabolic Panel: Recent Labs  Lab 12/20/17 [phone] 12/21/17 0232 12/22/17 0232 12/22/17 1828 12/23/17 0239 12/24/17 0217  NA 134* 138 136  --  135 134*  K 4.6 4.1 4.0  --  3.8 4.2  CL 96* 100 99  --  96* 94*  CO2 23 28 25   --  29 30  GLUCOSE 287* 327* 302* 417* 247* 189*  BUN 46* 40* 28*  --  30* 38*  CREATININE 1.93* 1.32* 1.12*  --  1.34* 1.35*  CALCIUM 7.8* 8.3* 8.4*  --  8.5* 8.2*  MG  --  2.2 2.3  --  2.3  --    Liver Function Tests: Recent Labs  Lab 12/21/17 0232  AST 16  ALT 15   ALKPHOS 41  BILITOT 0.5  PROT 6.7  ALBUMIN 3.5   No results for input(s): LIPASE, AMYLASE in the last 168 hours. No results for input(s): AMMONIA in the last 168 hours. CBC: Recent Labs  Lab 12/19/17 1750 12/21/17 0232 12/22/17 0232 12/23/17 0239 12/24/17 0217  WBC 13.1* 12.0* 12.0* 12.3* 14.1*  NEUTROABS 10.7* 10.1*  --  9.8*  --   HGB 10.9* 10.4* 10.9* 10.8* 10.8*  HCT 34.3* 32.1* 33.7* 33.1* 32.2*  MCV 99.1 98.2 97.4 95.4 95.0  PLT 309 287 292 314 323   Cardiac Enzymes: No results for input(s): CKTOTAL, CKMB, CKMBINDEX, TROPONINI in the last 168 hours. BNP: BNP (last 3 results) Recent Labs    08/12/17 0544 10/05/17 1625 12/20/17 0028  BNP 46.4 30.0 34.1    ProBNP (last 3 results) No results for input(s): PROBNP in the last 8760 hours.  CBG: Recent Labs  Lab 12/23/17 1150 12/23/17 1705 12/23/17 2046 12/24/17 0723 12/24/17 1153  GLUCAP 340* 332* 203* 223* 249*       Signed:  Florencia Reasons MD, PhD  [facility] 12/24/2017, 5:43 PM

## 2017-12-24 NOTE — Progress Notes (Signed)
Pt seen by MD, orders written for d/c.  Went over discharge instructions with pt and answered all questions.  Removed IV, no complications.  Escorted for discharge via wheelchair with all belongings.  Will follow up outpatient with MD. 

## 2017-12-24 NOTE — Telephone Encounter (Signed)
Spoke with Marzetta Board at [facility]. She stated that they will not be able to fill the patient's RX due to them being out of network with her insurance.   Left message for patient.

## 2017-12-24 NOTE — Care Management Important Message (Signed)
Important Message  Patient Details  Name: PARTHENIA TELLEFSEN MRN: [id number] Date of Birth: [birth year]   Medicare Important Message Given:  Yes    Bengie Kaucher [address] 12/24/2017, 3:41 PM

## 2017-12-25 ENCOUNTER — Telehealth: Payer: Self-pay | Admitting: Internal Medicine

## 2017-12-25 DIAGNOSIS — J410 Simple chronic bronchitis: Secondary | ICD-10-CM

## 2017-12-25 MED ORDER — FORMOTEROL FUMARATE 20 MCG/2ML IN NEBU: 20.0000 ug | INHALATION_SOLUTION | Freq: Two times a day (BID) | RESPIRATORY_TRACT | 12 refills | Status: DC

## 2017-12-25 MED ORDER — FORMOTEROL FUMARATE 20 MCG/2ML IN NEBU: 20.0000 ug | INHALATION_SOLUTION | Freq: Two times a day (BID) | RESPIRATORY_TRACT | 6 refills | Status: DC

## 2017-12-25 NOTE — Telephone Encounter (Signed)
Spent in perforomist neb in place of brovana

## 2017-12-25 NOTE — Telephone Encounter (Signed)
I have placed a  dme order for [facility] for the perforomist and have printed a script for Beth to sign.  I have called and left the patient a message for the patient to make her aware of this. Nothing further needed at this time.

## 2017-12-25 NOTE — Telephone Encounter (Signed)
Called and spoke with the patient she states that the brovana is very costly and she is unable to afford this and is asking if there is an alternative she can take.  Beth NP  please advise.Thank you

## 2017-12-25 NOTE — Telephone Encounter (Signed)
She has [employer] medicare, Perforomist or Garlon Hatchet should be entered under part B with pharmacy. If needed could change DME company to [place]. We have samples of Perforomist, she could come to office and pick up ONE.   Can we get [facility] involved to help... Call [facility] with [facility] [phone]

## 2017-12-25 NOTE — Telephone Encounter (Signed)
We have already tried preformisit pre the telephone call on 8/21. Beth please advise.

## 2017-12-25 NOTE — Telephone Encounter (Signed)
Spoke to the patient and let her the plan of sending it to go to the DME company. Nothing further need.

## 2017-12-26 NOTE — Telephone Encounter (Signed)
Spoke with pt. She is aware of this information.  [facility] - could you please help and let us know what DME's are in network with her insurance? Thanks.

## 2017-12-27 NOTE — Telephone Encounter (Signed)
I faxed this order to [place] with [employer] on 12/25/17

## 2017-12-28 ENCOUNTER — Ambulatory Visit (INDEPENDENT_AMBULATORY_CARE_PROVIDER_SITE_OTHER): Payer: Medicare Other | Admitting: Internal Medicine

## 2017-12-28 ENCOUNTER — Encounter: Payer: Self-pay | Admitting: *Deleted

## 2017-12-28 ENCOUNTER — Telehealth: Payer: Self-pay | Admitting: Internal Medicine

## 2017-12-28 ENCOUNTER — Ambulatory Visit: Payer: Medicare Other | Admitting: Internal Medicine

## 2017-12-28 VITALS — BP 150/70 | HR 67 | Ht 61.0 in | Wt 167.0 lb

## 2017-12-28 DIAGNOSIS — R0609 Other forms of dyspnea: Secondary | ICD-10-CM | POA: Diagnosis not present

## 2017-12-28 DIAGNOSIS — J410 Simple chronic bronchitis: Secondary | ICD-10-CM | POA: Diagnosis not present

## 2017-12-28 DIAGNOSIS — R058 Other specified cough: Secondary | ICD-10-CM

## 2017-12-28 DIAGNOSIS — R05 Cough: Secondary | ICD-10-CM

## 2017-12-28 MED ORDER — PREDNISONE 10 MG PO TABS: ORAL_TABLET | ORAL | 0 refills | Status: DC

## 2017-12-28 MED ORDER — FORMOTEROL FUMARATE 20 MCG/2ML IN NEBU: 20.0000 ug | INHALATION_SOLUTION | Freq: Two times a day (BID) | RESPIRATORY_TRACT | 0 refills | Status: DC

## 2017-12-28 MED ORDER — TRAMADOL HCL 50 MG PO TABS: 50.0000 mg | ORAL_TABLET | ORAL | 0 refills | Status: DC | PRN

## 2017-12-28 NOTE — Progress Notes (Signed)
Subjective:   Patient ID: [name], female    DOB: 05-17-1950    MRN: [id number]    Brief patient profile:  67 yobf quit smoking 2005 with nl pfts 2012  with recurrent non-specific resp flares with nl pfts during flares c/w pseudoasthma.     History of Present Illness  01/24/2016  Extended post hosp f/u ov/ transition of care/ /[name] re: dtca asthma/ vcd symb 160 and pred 5 x 10 mg daily  Chief Complaint  Patient presents with  . Follow-up    Pt. recently got out the hopistal for an asthma attack, Pt. states her breathing remains the same, coughing with some yellow to white mucus,Has been needing to use Ventolin more often  last doing well 2 months prior to OV   While on Symb 160/ gerd rx but not dosing ac as rec  Waking up prematurely x 2 months with cough/wheeze/ sob but never contacted me as per the instructions rec Plan A = Automatic = dulera 100 Take 2 puffs first thing in am and then another 2 puffs about 12 hours later.                                      Nexum 40 mg Take 30-60 min before first meal of the day and Pepcid AC 20 mg  at bedtime Work on inhaler technique  Plan B = Backup Only use your albuterol as a rescue medication Plan C = Crisis - only use your albuterol nebulizer if you first try Plan B and it fails to help > ok to use the nebulizer up to every 4 hours but if start needing it regularly call for immediate appointment GERD diet  Take delsym two tsp every 12 hours and use the flutter valve as much as possible and supplement if needed with  tramadol 50 mg up to 2 every 4 hours to suppress the urge to cough  Once you have eliminated the cough for 3 straight days try reducing the tramadol first,  then the delsym as tolerated.   Prednisone 5 mg x 8, then 6, then 4 then 2, then 1 daily x 2 days and off  please schedule a follow up office visit in 2 weeks, sooner if needed with all meds/ inhalers/ neb solutions in hand     09/18/2016  f/u ov/[name] re:  UACS vs  Asthma brought med calendar but not updated / on symb 160 / max gerd rx  Chief Complaint  Patient presents with  . Follow-up    Pt c/o occ wheezing. She has not used rescue inhaler or neb recently.   still feels wheezing  And doe = MMRC1 = can walk nl pace, flat grade, can't hurry or go uphills or steps s sob rec Change the symbicort to 80 Take 2 puffs first thing in am and then another 2 puffs about 12 hours later.  Work on inhaler technique:  Use the med calendar daily to organize your medications as a "checklist" See [name] in 4  weeks with all your medications> never happened      Admit date: 12/19/2017 Discharge date: 12/24/2017  D/c home with steroids taper, monitor blood glucose while on steroids She reports run out of pulmicort nebs, new prescription provided, she is to follow up with [name] to further discuss asthma/copd meds, she is concerned about cost of these meds  Discharge Diagnoses:      Active Hospital Problems   Diagnosis Date Noted  . Acute bronchitis with asthma with acute exacerbation 01/16/2016  . Acute renal failure superimposed on stage 3 chronic kidney disease ([name]) 07/17/2016  . Chronic diastolic CHF (congestive heart failure) ([name]) 07/17/2016  . Acute respiratory failure with hypoxia ([name])   . Upper airway cough syndrome vs Asthma  08/28/2014  . Diabetes mellitus type 2, controlled ([name]) 08/06/2014  . Essential hypertension 07/21/2008    Resolved Hospital Problems  No resolved problems to display.      Chief Complaint:Respiratory distress  VOJ:JKKXF H Oldhamis a 67 y.o.femalewith medical history significant ofCAD, chronic grade 2 diastolic CHF, DM, HTN, prior-PE, recurrent admissions for asthma exacerbations vs upper airway cough syndrome.  Patient developed SOB onset Monday, associated wheezing. This worsened despite home nebs. EMS called for respiratory distress. Patient got albuterol and solumedrol en-route to ED. Arrives  to ED on CPAP.   Feels like her typical asthma symptoms she says.   ED Course:Patient given further neb treatments, magnesium, and put on BIPAP.  Productive cough since Monday but no fever.   Hospital Course:  Principal Problem:   Acute bronchitis with asthma with acute exacerbation Active Problems:   Essential hypertension   Diabetes mellitus type 2, controlled ([name])   Upper airway cough syndrome vs Asthma    Acute respiratory failure with hypoxia ([facility])   Chronic diastolic CHF (congestive heart failure) ([facility])   Acute renal failure superimposed on stage 3 chronic kidney disease ([facility])   Asthma /COPD exacerbation -quit smoking several yrs ago ( prior to that she smoked cigarette for about 33yrs) -she reports cough/wheezing/sob since Monday, she reports this year she has several hospitalization for asthma exacerbation, last in 09/2017 when she stayed for 13days, she denies h/o intubation -she is brought in by EMS, she received solumdrol/nebs enroute by EMS, she was put on cpap by EMS, she is transitioned to bipap in the ED due to significant wheezing, tachypnea -initial abg ph wnl, negative ddimer -initial cxr " Left base atelectasis. No edema or consolidation. Stable cardiac silhouette" -mrsa screening negative -respiratory viral panel + rhinovirus, urine strep pneumo negative, urine legionella antigen negative -wheezing has resolved, she is discharged on prednisone taper, continue mucinex, singulair,  -she is to follow up with pulmonology [name]  Acute on chronic diastolic chf -she has bilateral lower extremity pitting edem, she reports weight gain of 4lbs in the last few days -negative troponin, bmp 34 -home meds demadex held since admission, iv lasix ( concerning for malobsoprtion of oral diuretics in the setting of chf exacerbation) -daily weight, strict intake and output -edema has almost resolved, transition from iv lasix to home meds demadex  AKI on CKD  III -bun/cr on presentation is 44/2.01 -ua unremarkable -has back to baseline. -home meds metformin held in the hospital ,resumed at discharge.  HTN;  -Home meds nebivolol/cardizem/hydralazine continued -iv lasix stopped, home meds demadex restarted -taper steroids            12/28/2017 ext post hosp f/u ov/Carie Kapuscinski re: confused with meds again / only slightly better breathing  Chief Complaint  Patient presents with  . Follow-up    Breathing has improved slightly. She has been out of brovana due to high cost x 3 days. She has had to use her albuterol inhaler and neb daily since then.   Dyspnea:  50 ft = About the same as usual Cough: after supper is the worse, but not  much mucus Sleeping: wakes up coughing 30 degrees due to sensation of reflux    SABA use: last 5 h prior to OV   02: none     No obvious day to day or daytime variability or assoc excess/ purulent sputum or mucus plugs or hemoptysis or cp or chest tightness, subjective wheeze or overt sinus or hb symptoms.   Sleeping at 30 degrees  without nocturnal  or early am exacerbation  of respiratory  c/o's or need for noct saba. Also denies any obvious fluctuation of symptoms with weather or environmental changes or other aggravating or alleviating factors except as outlined above   No unusual exposure hx or h/o childhood pna/ asthma or knowledge of premature birth.  Current Allergies, Complete Past Medical History, Past Surgical History, Family History, and Social History were reviewed in [employer] record.  ROS  The following are not active complaints unless bolded Hoarseness, sore throat, dysphagia, dental problems, itching, sneezing,  nasal congestion or discharge of excess mucus or purulent secretions, ear ache,   fever, chills, sweats, unintended wt loss or wt gain, classically pleuritic or exertional cp,  orthopnea pnd or arm/hand swelling  or leg swelling, presyncope, palpitations, abdominal  pain, anorexia, nausea, vomiting, diarrhea  or change in bowel habits or change in bladder habits, change in stools or change in urine, dysuria, hematuria,  rash, arthralgias, visual complaints, headache, numbness, weakness or ataxia or problems with walking or coordination,  change in mood= anxious or  memory.        Current Meds - - NOTE:   Unable to verify as accurately reflecting what pt takes     Medication Sig  . albuterol (PROAIR HFA) 108 (90 Base) MCG/ACT inhaler Inhale 2 puffs into the lungs every 4 (four) hours as needed for wheezing or shortness of breath.  Marland Kitchen albuterol (PROVENTIL) (2.5 MG/3ML) 0.083% nebulizer solution Take 3 mLs (2.5 mg total) by nebulization 3 (three) times daily. And every 4 hours as needed (Patient taking differently: Take 2.5 mg by nebulization every 4 (four) hours as needed for wheezing. And every 4 hours as needed)  . ALPRAZolam (XANAX) 0.5 MG tablet Take 1 tablet (0.5 mg total) by mouth 2 (two) times daily as needed for anxiety.  Marland Kitchen aspirin EC 81 MG tablet Take 81 mg by mouth daily.   Marland Kitchen atorvastatin (LIPITOR) 40 MG tablet Take 40 mg by mouth daily.  . Biotin 5000 MCG CAPS Take 1 capsule by mouth daily.  . budesonide (PULMICORT) 0.5 MG/2ML nebulizer solution Take 2 mLs (0.5 mg total) by nebulization 2 (two) times daily.  Marland Kitchen diltiazem (CARDIZEM CD) 240 MG 24 hr capsule Take 1 capsule (240 mg total) by mouth daily.  . divalproex (DEPAKOTE ER) 500 MG 24 hr tablet Take 2 tablets (1,000 mg total) by mouth at bedtime.  Marland Kitchen esomeprazole (NEXIUM) 40 MG capsule Take 1 capsule (40 mg total) by mouth 2 (two) times daily before a meal.  . estradiol (ESTRACE) 1 MG tablet Take 1 mg by mouth daily.   . fluticasone (FLONASE) 50 MCG/ACT nasal spray Place 2 sprays into both nostrils daily.  . formoterol (PERFOROMIST) 20 MCG/2ML nebulizer solution Take 2 mLs (20 mcg total) by nebulization 2 (two) times daily. J41.0  . gabapentin (NEURONTIN) 100 MG capsule Take 1 capsule (100 mg total)  by mouth 3 (three) times daily.  . hydrALAZINE (APRESOLINE) 25 MG tablet Take 1 tablet (25 mg total) by mouth 2 (two) times daily.  . metFORMIN (  GLUCOPHAGE) 1000 MG tablet Take 1 tablet (1,000 mg total) by mouth 2 (two) times daily with a meal.  . montelukast (SINGULAIR) 10 MG tablet Take 1 tablet (10 mg total) by mouth daily.  . nebivolol (BYSTOLIC) 2.5 MG tablet Take 2.5 mg by mouth daily.  . nitroGLYCERIN (NITROSTAT) 0.4 MG SL tablet Place 1 tablet (0.4 mg total) under the tongue every 5 (five) minutes as needed for chest pain. x3 as needed  . potassium chloride SA (K-DUR,KLOR-CON) 20 MEQ tablet Take 20 mEq by mouth daily.   . ranitidine (ZANTAC) 150 MG tablet Take 150 mg by mouth daily.  Marland Kitchen torsemide (DEMADEX) 20 MG tablet Take 20 mg by mouth daily.   . traMADol (ULTRAM) 50 MG tablet Take 1 tablet (50 mg total) by mouth every 4 (four) hours as needed (cough or pain).  . [DISCONTINUED] formoterol (PERFOROMIST) 20 MCG/2ML nebulizer solution Take 2 mLs (20 mcg total) by nebulization 2 (two) times daily. J41.0  . [DISCONTINUED] guaiFENesin (MUCINEX) 600 MG 12 hr tablet Take 1 tablet (600 mg total) by mouth 2 (two) times daily.  . [DISCONTINUED] HYDROcodone-homatropine (HYCODAN) 5-1.5 MG/5ML syrup Take 5 mLs by mouth every 6 (six) hours as needed for cough.  . [ ]  predniSONE (DELTASONE) 10 MG tablet Label  & dispense according to the schedule below. 6 Pills PO on day one then, 5 Pills PO on day two, 4 Pills PO on day three, 3Pills PO on day four, 2 Pills PO on day five, 1 Pills PO on day six,  then STOP.  Total of 21 tabs  . [ ]  traMADol (ULTRAM) 50 MG tablet Take 50 mg by mouth every 6 (six) hours as needed.                     Objective:   Physical Exam      02/23/2016   168 >  04/07/2016  168 > 05/08/2016    168 >  07/17/2016  162 > 09/18/2016    164  > 09/18/2017  160 > 12/28/2017 167     Vital signs reviewed - Note on arrival 02 sats  100% on RA     amb mod hoarse bf nad at rest  but very prominent pseudowheezing     HEENT: nl dentition, turbinates bilaterally, and oropharynx. Nl external ear canals without cough reflex   NECK :  without JVD/Nodes/TM/ nl carotid upstrokes bilaterally   LUNGS: no acc muscle use,  Nl contour chest which is clear to A and P bilaterally with  cough on exp maneuvers   CV:  RRR  no s3 or murmur or increase in P2, and no edema   ABD:  soft and nontender with nl inspiratory excursion in the supine position. No bruits or organomegaly appreciated, bowel sounds nl  MS:  Nl gait/ ext warm without deformities, calf tenderness, cyanosis or clubbing No obvious joint restrictions   SKIN: warm and dry without lesions    NEURO:  alert, approp, nl sensorium with  no motor or cerebellar deficits apparent.       I personally reviewed images and agree with radiology impression as follows:  DgEs 10/09/17  Low range of penetration without aspiration. Otherwise, normal Exam.  cxr 12/19/17:  Left base atelectasis. No edema or consolidation. Stable cardiac silhouette.

## 2017-12-28 NOTE — Telephone Encounter (Signed)
Called [facility] and spoke with Courtney Prednisone sent at today's visit does not have specific instructions - per Loma Sousa they cannot dispense if they do not have a days' supply to go off of.  Did attempt to explain to Loma Sousa that patient has a medication reconciliation form that has specific taper instructions but Loma Sousa reported they need a days' supply on the Rx, despite the quantity.  Dr Melvyn Novas please advise on taper directions, thank you.

## 2017-12-28 NOTE — Patient Instructions (Addendum)
Plan A = Automatic = Performist 20 mcg one vial  with pulmocort one half vial twice daily  Gabapentin 100 mg three times daily Nexium Take 30- 60 min before your first and last meals of the day and zantac (ranitidine) at bedtime  Along with chlorpheniramine 4 mg 1 or 2 also at bedtime (over the counter)  Continue prednisone 10 mg every day    Plan B = Backup for breathing Only use your albuterol as a rescue medication to be used if you can't catch your breath by resting or doing a relaxed purse lip breathing pattern.  - The less you use it, the better it will work when you need it. - Ok to use the inhaler up to 2 puffs  every 4 hours if you must but call for appointment if use goes up over your usual need - Don't leave home without it !!  (think of it like the spare tire for your car)   Plan B = Backup for coughing  Add tramadol 50 mg 1-2 every 4 hours if needed   Plan C = Crisis - only use your albuterol nebulizer if you first try Plan B and it fails to help > ok to use the nebulizer up to every 4 hours but if start needing it regularly call for immediate appointment    See [name] NP w/in 2 weeks with all your medications, even over the counter meds, separated in two separate bags, the ones you take no matter what vs the ones you stop once you feel better and take only as needed when you feel you need them.   [name]  will generate for you a new user friendly medication calendar that will put [place] all on the same page re: your medication use.     Without this process, it simply isn't possible to assure that we are providing  your outpatient care  with  the attention to detail we feel you deserve.   If we cannot assure that you're getting that kind of care,  then we cannot manage your problem effectively from this clinic.  Once you have seen [name] and we are sure that we're all on the same page with your medication use she will arrange follow up with me.

## 2017-12-29 NOTE — Progress Notes (Addendum)
Subjective:  Patient ID: [name], female    DOB: 04/25/1951,  MRN: [id number]  Chief Complaint  Patient presents with  . Foot Pain    3 week follow up    67 y.o. female presents with the above complaint.  States that her pain improved for 1 day but start this full again.  Her PCP has increased her fluid medication.  Took the boot off to the third day.   Review of Systems: Negative except as noted in the HPI. Denies N/V/F/Ch.  Past Medical History:  Diagnosis Date  . Anxiety   . Asthma   . Bronchitis   . Chronic diastolic CHF (congestive heart failure) ([place])   . Chronic diastolic CHF (congestive heart failure) ([place]) 07/17/2016  . Coronary artery disease    a. NSTEMI 12/2015 - LHC 01/18/16: s/p overlapping DESx2 to RCA, 10% ost-prox Cx,10% mLAD.  Marland Kitchen Depression   . Diabetes mellitus   . Dyslipidemia   . GERD (gastroesophageal reflux disease)   . Heart attack ([place])   . Hypertension   . Hypertensive heart disease   . NSTEMI (non-ST elevated myocardial infarction) ([place]) 12/2015  . Pneumonia     Current Outpatient Medications:  .  albuterol (PROAIR HFA) 108 (90 Base) MCG/ACT inhaler, Inhale 2 puffs into the lungs every 4 (four) hours as needed for wheezing or shortness of breath., Disp: 1 Inhaler, Rfl: 5 .  albuterol (PROVENTIL) (2.5 MG/3ML) 0.083% nebulizer solution, Take 3 mLs (2.5 mg total) by nebulization 3 (three) times daily. And every 4 hours as needed (Patient taking differently: Take 2.5 mg by nebulization every 4 (four) hours as needed for wheezing. And every 4 hours as needed), Disp: 500 mL, Rfl: 1 .  ALPRAZolam (XANAX) 0.5 MG tablet, Take 1 tablet (0.5 mg total) by mouth 2 (two) times daily as needed for anxiety., Disp: 30 tablet, Rfl: 0 .  aspirin EC 81 MG tablet, Take 81 mg by mouth daily. , Disp: , Rfl:  .  atorvastatin (LIPITOR) 40 MG tablet, Take 40 mg by mouth daily., Disp: , Rfl:  .  Biotin 5000 MCG CAPS, Take 1 capsule by mouth daily., Disp: , Rfl:  .   budesonide (PULMICORT) 0.5 MG/2ML nebulizer solution, Take 2 mLs (0.5 mg total) by nebulization 2 (two) times daily., Disp: 120 mL, Rfl: 0 .  diltiazem (CARDIZEM CD) 240 MG 24 hr capsule, Take 1 capsule (240 mg total) by mouth daily., Disp: 90 capsule, Rfl: 3 .  divalproex (DEPAKOTE ER) 500 MG 24 hr tablet, Take 2 tablets (1,000 mg total) by mouth at bedtime., Disp: 60 tablet, Rfl: 11 .  esomeprazole (NEXIUM) 40 MG capsule, Take 1 capsule (40 mg total) by mouth 2 (two) times daily before a meal., Disp: 60 capsule, Rfl: 3 .  estradiol (ESTRACE) 1 MG tablet, Take 1 mg by mouth daily. , Disp: , Rfl:  .  fluticasone (FLONASE) 50 MCG/ACT nasal spray, Place 2 sprays into both nostrils daily., Disp: 16 g, Rfl: 2 .  formoterol (PERFOROMIST) 20 MCG/2ML nebulizer solution, Take 2 mLs (20 mcg total) by nebulization 2 (two) times daily. J41.0, Disp: 250 mL, Rfl: 0 .  gabapentin (NEURONTIN) 100 MG capsule, Take 1 capsule (100 mg total) by mouth 3 (three) times daily., Disp: 90 capsule, Rfl: 0 .  hydrALAZINE (APRESOLINE) 25 MG tablet, Take 1 tablet (25 mg total) by mouth 2 (two) times daily., Disp: 180 tablet, Rfl: 3 .  metFORMIN (GLUCOPHAGE) 1000 MG tablet, Take 1 tablet (1,000 mg  total) by mouth 2 (two) times daily with a meal., Disp: 60 tablet, Rfl: 3 .  montelukast (SINGULAIR) 10 MG tablet, Take 1 tablet (10 mg total) by mouth daily., Disp: 30 tablet, Rfl: 3 .  nebivolol (BYSTOLIC) 2.5 MG tablet, Take 2.5 mg by mouth daily., Disp: , Rfl:  .  nitroGLYCERIN (NITROSTAT) 0.4 MG SL tablet, Place 1 tablet (0.4 mg total) under the tongue every 5 (five) minutes as needed for chest pain. x3 as needed, Disp: 25 tablet, Rfl: 6 .  potassium chloride SA (K-DUR,KLOR-CON) 20 MEQ tablet, Take 20 mEq by mouth daily. , Disp: , Rfl:  .  predniSONE (DELTASONE) 10 MG tablet, Take as directed, Disp: 60 tablet, Rfl: 0 .  ranitidine (ZANTAC) 150 MG tablet, Take 150 mg by mouth daily., Disp: , Rfl:  .  torsemide (DEMADEX) 20 MG  tablet, Take 20 mg by mouth daily. , Disp: , Rfl:  .  traMADol (ULTRAM) 50 MG tablet, Take 1 tablet (50 mg total) by mouth every 4 (four) hours as needed (cough or pain)., Disp: 40 tablet, Rfl: 0  Social History   Tobacco Use  Smoking Status Former Smoker  . Packs/day: 1.00  . Years: 30.00  . Pack years: 30.00  . Types: Cigarettes  . Last attempt to quit: 01/09/2004  . Years since quitting: 13.9  Smokeless Tobacco Never Used    Allergies  Allergen Reactions  . Amlodipine Besylate Other (See Comments)    Reaction unknown  . Lisinopril Other (See Comments)    Reaction unknown  . Pantoprazole Sodium Other (See Comments)    Reaction unknown   Objective:  There were no vitals filed for this visit. There is no height or weight on file to calculate BMI. Constitutional Well developed. Well nourished.  Vascular Dorsalis pedis pulses palpable bilaterally. Posterior tibial pulses palpable bilaterally. Capillary refill normal to all digits.  No cyanosis or clubbing noted. Pedal hair growth normal.  Neurologic Normal speech. Oriented to person, place, and time. Epicritic sensation to light touch grossly present bilaterally.  Dermatologic Nails well groomed and normal in appearance. No open wounds. No skin lesions.  Orthopedic: Normal joint ROM without pain or crepitus bilaterally. No visible deformities. Pain palpation about the dorsal TMT's first second left   Radiographs: None Assessment:   1. Capsulitis of foot, left   2. Arthritis of midfoot   3. Venous (peripheral) insufficiency   4. Localized edema    Plan:  Patient was evaluated and treated and all questions answered.  Midfoot arthritis -Injection #2 as below.  Procedure: Joint Injection Location: Left 1st/2nd TMT joint Skin Prep: Alcohol. Injectate: 0.5 cc 1% lidocaine plain, 0.5 cc dexamethasone phosphate. Disposition: Patient tolerated procedure well. Injection site dressed with a band-aid.   Venous  insufficiency -Continue compression wraps as needed  Return if symptoms worsen or fail to improve.

## 2017-12-31 ENCOUNTER — Encounter: Payer: Self-pay | Admitting: Internal Medicine

## 2017-12-31 NOTE — Assessment & Plan Note (Addendum)
- CT sinus 08/06/14 > neg  - Allergy profile 02/23/16  >  Eos 0.0 /  IgE  87 pos  RAST  Grass/ trees  - improved 05/08/2016 on gabapentin 300 tid > no change rx > d/c'd 07/2016 DgEs 10/09/17  Low range of penetration without aspiration. Otherwise, normal - Asthma exac 12/17/17- FENO 18. Significant upper airway wheeze, tx pred taper. Add neurontin 100mg  TID.   - Spirometry 12/28/2017  FEV1 1.01 (63%)  Ratio 86 5 h p last saba with prominent wheeze on exam   - 12/28/2017 rec pred 10 mg as floor    DDX of  difficult airways management almost all start with A and  include Adherence, Ace Inhibitors, Acid Reflux, Active Sinus Disease, Alpha 1 Antitripsin deficiency, Anxiety masquerading as Airways dz,  ABPA,  Allergy(esp in young), Aspiration (esp in elderly), Adverse effects of meds,  Active smokers, A bunch of PE's (a small clot burden can't cause this syndrome unless there is already severe underlying pulm or vascular dz with poor reserve) plus two Bs  = Bronchiectasis and Beta blocker use..and one C= CHF   Adherence is always the initial "prime suspect" and is a multilayered concern that requires a "trust but verify" approach in every patient - starting with knowing how to use medications, especially inhalers, correctly, keeping up with refills and understanding the fundamental difference between maintenance and prns vs those medications only taken for a very short course and then stopped and not refilled.  - return for med reconciliation asap.  To keep things simple, I have asked the patient to first separate medicines that are perceived as maintenance, that is to be taken daily "no matter what", from those medicines that are taken on only on an as-needed basis and I have given the patient examples of both, and then return to see our NP to generate a  detailed  medication calendar which should be followed until the next physician sees the patient and updates it.    ? Acid (or non-acid) GERD > always difficult  to exclude as up to 75% of pts in some series report no assoc GI/ Heartburn symptoms> rec continue max (24h)  acid suppression and diet restrictions/ reviewed    - Of the three most common causes of  Sub-acute / recurrent or chronic cough, only one (GERD)  can actually contribute to/ trigger  the other two (asthma and post nasal drip syndrome)  and perpetuate the cylce of cough.  While not intuitively obvious, many patients with chronic low grade reflux do not cough until there is a primary insult that disturbs the protective epithelial barrier and exposes sensitive nerve endings.   This is typically viral but can due to PNDS and  either may apply here.   The point is that once this occurs, it is difficult to eliminate the cycle  using anything but a maximally effective acid suppression regimen at least in the short run, accompanied by an appropriate diet to address non acid GERD and control / eliminate the cough itself with gabapentin - increased gapapentin to 100 mg tid as only taking once a day and tramadol x 3 days only plus 1st gen H1 blockers per guidelines    ? Anxiety > usually at the bottom of this list of usual suspects but should be much higher on this pt's based on H and P and note already on psychotropics and may interfere with adherence and also interpretation of response or lack thereof to symptom management  which can be quite subjective.    ? Allergy/ asthma > continue prednisone 10 mg per day but decrease pulmocort to 0.25 mg bid due to hoarsness and absence of demonstrable lower airflow obst with "wheezing" on exam today  - continue performist for now as well as singulair but plan to modify this going forward once achieve full and accurate med reconciliation  ? Adverse effects of meds > none of the usual suspects listed  ? BB effects > very unlikely on low dose bystolic     I had an extended discussion with the patient reviewing all relevant studies completed to date and  lasting  25 minutes of a 40  minute office  visit to re-establish with me  S/p another hospitalization (3 in last 6 m)  for the same problem   re  severe non-specific but potentially very serious refractory respiratory symptoms of uncertain and potentially multiple  etiologies.   Each maintenance medication was reviewed in detail including most importantly the difference between maintenance and prns and under what circumstances the prns are to be triggered using an action plan format that is not reflected in the computer generated alphabetically organized AVS.    Please see AVS for specific instructions unique to this office visit that I personally wrote and verbalized to the the pt in detail and then reviewed with pt  by my nurse highlighting any changes in therapy/plan of care  recommended at today's visit.

## 2017-12-31 NOTE — Telephone Encounter (Signed)
As per instructions on AVS  "continue prednisone 10 mg daily "    (further adjustment planned at ov in 2 weeks)

## 2017-12-31 NOTE — Assessment & Plan Note (Signed)
09/18/2016  Walked RA x 3 laps @ 185 ft each stopped due to  End of study, fast pace, min sob,  No  desat    - 12/28/2017  Walked RA x 3 laps @ 185 ft each stopped due to  Sob but walked at nl  pace, no desat   Complaints of chronic doe x 50 ft not reproduced at ov and will not be further addressed / suspect deconditioning and pseudoasthma here.

## 2018-01-01 NOTE — Telephone Encounter (Signed)
Spoke with [name] at pharmacy, order clarified, prednisone 10mg  daily until next OV. Nothing futher needed at this time.

## 2018-01-11 ENCOUNTER — Encounter: Payer: Self-pay | Admitting: Adult Health

## 2018-01-11 ENCOUNTER — Ambulatory Visit (INDEPENDENT_AMBULATORY_CARE_PROVIDER_SITE_OTHER): Payer: Medicare Other | Admitting: Adult Health

## 2018-01-11 DIAGNOSIS — J4530 Mild persistent asthma, uncomplicated: Secondary | ICD-10-CM

## 2018-01-11 DIAGNOSIS — R05 Cough: Secondary | ICD-10-CM

## 2018-01-11 DIAGNOSIS — I5032 Chronic diastolic (congestive) heart failure: Secondary | ICD-10-CM

## 2018-01-11 DIAGNOSIS — R058 Other specified cough: Secondary | ICD-10-CM

## 2018-01-11 NOTE — Progress Notes (Signed)
@Patient  ID: [name], female    DOB: 08-12-1950, 67 y.o.   MRN: [id number]  Chief Complaint  Patient presents with  . Follow-up    asthma     Referring provider: [name], MD  HPI: [phone] former smoker followed for Asthma  PMH + CAD, DM , D CHF , seizure history  TEST  FEN0 01/24/2016= 13 with active symptoms on symb 160 2bid >changed to dulera 100  - Spirometry 02/23/2016 NO signifiant obstruction with active symptoms  - FENO 02/23/2016= 12 On dulera 100 2bid (unable to confirm adeherence as did not bring it - Singulair 10 mg daily added 10/25/2017to dulera 100 2bid with 2 week sample only (unable to verify she used it) - FENO 04/07/2016= 43 -CTa CHest 2017 >neg PE . 4 mm RUL nodule  CT sinus 2016 neg IgE 87 2017,positive Rast grass, dust, tree High-resolution CT chest April 2018--NEG  for ILD, stable 4 mm right upper lobe nodule. Echo 07/2017 EF 65%, Gr 2 DD , PAP 45mmHg  Spirometry 08/2017 Moderate restriction , no obstruction .  01/11/2018 Follow up : Asthma and Cough and Med Review  Patient presents for a 2-week follow-up.  Patient had a recent asthma exacerbation and was hospitalized last month.  She is starting to turn the corner.  Last visit she was given prednisone.  Patient says that she is better.  Cough has improved.  And near resolved.  She was recommended to restart gabapentin 100 mg 3 times daily.  Use tramadol for severe coughing.  And was changed to Perforomist and budesonide nebulizers twice daily.  Patient denies any wheezing or increased shortness of breath.  Patient has returned back to work.  We reviewed all her medications and organize them into a medication count with patient education.  It is unclear if patient took prednisone since last visit.  She is no longer taking it now.  Also patient had previously been started on Singulair but did not feel that this worked as well.     Allergies  Allergen Reactions  . Amlodipine  Besylate Other (See Comments)    Reaction unknown  . Lisinopril Other (See Comments)    Reaction unknown  . Pantoprazole Sodium Other (See Comments)    Reaction unknown    Immunization History  Administered Date(s) Administered  . Influenza Split 03/10/2012, 03/14/2015  . Influenza, High Dose Seasonal PF 02/04/2017, 01/04/2018  . Influenza-Unspecified 01/11/2016  . Zoster Recombinat (Shingrix) 06/11/2017, 08/28/2017, 09/27/2017    Past Medical History:  Diagnosis Date  . Anxiety   . Asthma   . Bronchitis   . Chronic diastolic CHF (congestive heart failure) ([place])   . Chronic diastolic CHF (congestive heart failure) ([place]) 07/17/2016  . Coronary artery disease    a. NSTEMI 12/2015 - LHC 01/18/16: s/p overlapping DESx2 to RCA, 10% ost-prox Cx,10% mLAD.  Marland Kitchen Depression   . Diabetes mellitus   . Dyslipidemia   . GERD (gastroesophageal reflux disease)   . Heart attack ([place])   . Hypertension   . Hypertensive heart disease   . NSTEMI (non-ST elevated myocardial infarction) ([place]) 12/2015  . Pneumonia     Tobacco History: Social History   Tobacco Use  Smoking Status Former Smoker  . Packs/day: 1.00  . Years: 30.00  . Pack years: 30.00  . Types: Cigarettes  . Last attempt to quit: 01/09/2004  . Years since quitting: 14.0  Smokeless Tobacco Never Used   Counseling given: Not Answered   Outpatient Medications Prior  to Visit  Medication Sig Dispense Refill  . albuterol (PROAIR HFA) 108 (90 Base) MCG/ACT inhaler Inhale 2 puffs into the lungs every 4 (four) hours as needed for wheezing or shortness of breath. 1 Inhaler 5  . albuterol (PROVENTIL) (2.5 MG/3ML) 0.083% nebulizer solution Take 3 mLs (2.5 mg total) by nebulization 3 (three) times daily. And every 4 hours as needed (Patient taking differently: Take 2.5 mg by nebulization every 4 (four) hours as needed for wheezing. And every 4 hours as needed) 500 mL 1  . ALPRAZolam (XANAX) 0.5 MG tablet Take 1 tablet (0.5 mg total) by  mouth 2 (two) times daily as needed for anxiety. 30 tablet 0  . aspirin EC 81 MG tablet Take 81 mg by mouth daily.     Marland Kitchen atorvastatin (LIPITOR) 40 MG tablet Take 40 mg by mouth daily.    . Biotin 5000 MCG CAPS Take 1 capsule by mouth daily.    . budesonide (PULMICORT) 0.5 MG/2ML nebulizer solution Take 2 mLs (0.5 mg total) by nebulization 2 (two) times daily. 120 mL 0  . diltiazem (CARDIZEM CD) 240 MG 24 hr capsule Take 1 capsule (240 mg total) by mouth daily. 90 capsule 3  . divalproex (DEPAKOTE ER) 500 MG 24 hr tablet Take 2 tablets (1,000 mg total) by mouth at bedtime. 60 tablet 11  . esomeprazole (NEXIUM) 40 MG capsule Take 1 capsule (40 mg total) by mouth 2 (two) times daily before a meal. 60 capsule 3  . estradiol (ESTRACE) 1 MG tablet Take 1 mg by mouth daily.     . fluticasone (FLONASE) 50 MCG/ACT nasal spray Place 2 sprays into both nostrils daily. 16 g 2  . formoterol (PERFOROMIST) 20 MCG/2ML nebulizer solution Take 2 mLs (20 mcg total) by nebulization 2 (two) times daily. J41.0 250 mL 0  . gabapentin (NEURONTIN) 100 MG capsule Take 1 capsule (100 mg total) by mouth 3 (three) times daily. 90 capsule 0  . hydrALAZINE (APRESOLINE) 25 MG tablet Take 1 tablet (25 mg total) by mouth 2 (two) times daily. 180 tablet 3  . metFORMIN (GLUCOPHAGE) 1000 MG tablet Take 1 tablet (1,000 mg total) by mouth 2 (two) times daily with a meal. 60 tablet 3  . montelukast (SINGULAIR) 10 MG tablet Take 1 tablet (10 mg total) by mouth daily. 30 tablet 3  . nebivolol (BYSTOLIC) 2.5 MG tablet Take 2.5 mg by mouth daily.    . nitroGLYCERIN (NITROSTAT) 0.4 MG SL tablet Place 1 tablet (0.4 mg total) under the tongue every 5 (five) minutes as needed for chest pain. x3 as needed 25 tablet 6  . potassium chloride SA (K-DUR,KLOR-CON) 20 MEQ tablet Take 20 mEq by mouth daily.     . predniSONE (DELTASONE) 10 MG tablet Take as directed 60 tablet 0  . ranitidine (ZANTAC) 150 MG tablet Take 150 mg by mouth daily.    Marland Kitchen  torsemide (DEMADEX) 20 MG tablet Take 20 mg by mouth daily.     . traMADol (ULTRAM) 50 MG tablet Take 1 tablet (50 mg total) by mouth every 4 (four) hours as needed (cough or pain). 40 tablet 0   No facility-administered medications prior to visit.      Review of Systems  Constitutional:   No  weight loss, night sweats,  Fevers, chills,+ fatigue, or  lassitude.  HEENT:   No headaches,  Difficulty swallowing,  Tooth/dental problems, or  Sore throat,  No sneezing, itching, ear ache, nasal congestion, post nasal drip,   CV:  No chest pain,  Orthopnea, PND, , anasarca, dizziness, palpitations, syncope.   GI  No heartburn, indigestion, abdominal pain, nausea, vomiting, diarrhea, change in bowel habits, loss of appetite, bloody stools.   Resp: No excess mucus, no productive cough,  No non-productive cough,  No coughing up of blood.  No change in color of mucus.  No wheezing.  No chest wall deformity  Skin: no rash or lesions.  GU: no dysuria, change in color of urine, no urgency or frequency.  No flank pain, no hematuria   MS:  No joint pain or swelling.  No decreased range of motion.  No back pain.    Physical Exam  Pulse 95   Ht 5' 1.5" (1.562 m)   Wt 166 lb 3.2 oz (75.4 kg)   LMP  (LMP Unknown)   SpO2 100%   BMI 30.89 kg/m   GEN: A/Ox3; pleasant , NAD, obese   HEENT:  /AT,  EACs-clear, TMs-wnl, NOSE-clear, THROAT-clear, no lesions, no postnasal drip or exudate noted.   NECK:  Supple w/ fair ROM; no JVD; normal carotid impulses w/o bruits; no thyromegaly or nodules palpated; no lymphadenopathy.    RESP  Clear  P & A; w/o, wheezes/ rales/ or rhonchi. no accessory muscle use, no dullness to percussion  CARD:  RRR, no m/r/g, 1+ peripheral edema, pulses intact, no cyanosis or clubbing.  GI:   Soft & nt; nml bowel sounds; no organomegaly or masses detected.   Musco: Warm bil, no deformities or joint swelling noted.   Neuro: alert, no focal deficits noted.      Skin: Warm, no lesions or rashes    Lab Results:  CBC   BNP  Imaging: Dg Chest Portable 1 View  Result Date: 12/19/2017 CLINICAL DATA:  Shortness of breath EXAM: PORTABLE CHEST 1 VIEW COMPARISON:  October 09, 2017 FINDINGS: There is atelectatic change in the left base. Lungs elsewhere are clear. Heart is upper normal in size with pulmonary vascularity normal. No adenopathy. No bone lesions. IMPRESSION: Left base atelectasis. No edema or consolidation. Stable cardiac silhouette. Electronically Signed   By: Lowella Grip III M.D.   On: 12/19/2017 18:20     Assessment & Plan:   Mild persistent chronic asthma without complication Recent flare now resolving . She is improved on current regimen  Cough Is resolved. Will decrease Gabapentin 100mg  Twice daily  .  Stop tramadol as she has seizure disorder on anticonvulsants - tramadol can lower the seizure threshold.   Plan  Patient Instructions  Stop Tramadol .  Follow med calendar closely and bring to each visit  Follow up Dr. Melvyn Novas  In 3 month and As needed   Please contact office for sooner follow up if symptoms do not improve or worsen or seek emergency care       Chronic diastolic CHF (congestive heart failure) ([place]) Appears compensated on present regimen .   Upper airway cough syndrome vs Asthma  Improved on current regimen   Plan  Patient Instructions  Stop Tramadol .  Follow med calendar closely and bring to each visit  Follow up Dr. Melvyn Novas  In 3 month and As needed   Please contact office for sooner follow up if symptoms do not improve or worsen or seek emergency care          Rexene Edison, NP 01/11/2018

## 2018-01-11 NOTE — Patient Instructions (Signed)
Stop Tramadol .  Follow med calendar closely and bring to each visit  Follow up Dr. Melvyn Novas  In 3 month and As needed   Please contact office for sooner follow up if symptoms do not improve or worsen or seek emergency care

## 2018-01-11 NOTE — Assessment & Plan Note (Signed)
Improved on current regimen   Plan  Patient Instructions  Stop Tramadol .  Follow med calendar closely and bring to each visit  Follow up Dr. Melvyn Novas  In 3 month and As needed   Please contact office for sooner follow up if symptoms do not improve or worsen or seek emergency care

## 2018-01-11 NOTE — Assessment & Plan Note (Signed)
Recent flare now resolving . She is improved on current regimen  Cough Is resolved. Will decrease Gabapentin 100mg  Twice daily  .  Stop tramadol as she has seizure disorder on anticonvulsants - tramadol can lower the seizure threshold.   Plan  Patient Instructions  Stop Tramadol .  Follow med calendar closely and bring to each visit  Follow up Dr. Melvyn Novas  In 3 month and As needed   Please contact office for sooner follow up if symptoms do not improve or worsen or seek emergency care

## 2018-01-11 NOTE — Assessment & Plan Note (Signed)
Appears compensated on present regimen .

## 2018-01-14 NOTE — Progress Notes (Signed)
Chart and office note reviewed in detail  > agree with a/p as outlined    

## 2018-01-15 ENCOUNTER — Ambulatory Visit: Payer: Medicare Other | Admitting: Cardiology

## 2018-01-15 NOTE — Addendum Note (Signed)
Addended by: Parke Poisson E on: 01/15/2018 05:20 PM   Modules accepted: Orders

## 2018-01-24 ENCOUNTER — Telehealth: Payer: Self-pay | Admitting: Internal Medicine

## 2018-01-24 NOTE — Telephone Encounter (Signed)
Called and spoke to pt.  Pt is requesting letter for her employer stating that she can only work part time hours.  Pt state she is unable to work full time, due to sob and feet edema.  Pt states she would like to pick letter up once complete.   MW please advise. Thanks  12/28/17 AVS:   Instructions   Plan A = Automatic = Performist 20 mcg one vial  with pulmocort one half vial twice daily  Gabapentin 100 mg three times daily Nexium Take 30- 60 min before your first and last meals of the day and zantac (ranitidine) at bedtime  Along with chlorpheniramine 4 mg 1 or 2 also at bedtime (over the counter)  Continue prednisone 10 mg every day    Plan B = Backup for breathing Only use your albuterol as a rescue medication to be used if you can't catch your breath by resting or doing a relaxed purse lip breathing pattern.  - The less you use it, the better it will work when you need it. - Ok to use the inhaler up to 2 puffs  every 4 hours if you must but call for appointment if use goes up over your usual need - Don't leave home without it !!  (think of it like the spare tire for your car)   Plan B = Backup for coughing  Add tramadol 50 mg 1-2 every 4 hours if needed   Plan C = Crisis - only use your albuterol nebulizer if you first try Plan B and it fails to help > ok to use the nebulizer up to every 4 hours but if start needing it regularly call for immediate appointment    See [name] NP w/in 2 weeks with all your medications, even over the counter meds, separated in two separate bags, the ones you take no matter what vs the ones you stop once you feel better and take only as needed when you feel you need them.   [name]  will generate for you a new user friendly medication calendar that will put [place] all on the same page re: your medication use.     Without this process, it simply isn't possible to assure that we are providing  your outpatient care  with  the attention to detail we feel  you deserve.   If we cannot assure that you're getting that kind of care,  then we cannot manage your problem effectively from this clinic.  Once you have seen [name] and we are sure that we're all on the same page with your medication use she will arrange follow up with me.          After Visit Summary (Printed 12/28/2017)

## 2018-01-25 ENCOUNTER — Encounter: Payer: Self-pay | Admitting: *Deleted

## 2018-01-25 NOTE — Telephone Encounter (Signed)
They are going to probably want something more specific which I can't do because I haven't seen her since last flare but ok to write letter stating exactly what she said.  Need to move up f/u appt to 2 weeks with med calendar and all active meds in hand as we should be able to restore her to 100% function if we work together

## 2018-01-25 NOTE — Telephone Encounter (Signed)
Spoke with pt. She is aware of MW's response. Pt would like for [place] to write the letter anyway. Pt had an appointment with [name] on 01/11/18 for a med calendar. Letter has been written and placed up front. Nothing further was needed at this time.

## 2018-02-05 ENCOUNTER — Other Ambulatory Visit: Payer: Self-pay

## 2018-02-05 ENCOUNTER — Telehealth: Payer: Self-pay | Admitting: Internal Medicine

## 2018-02-05 MED ORDER — GABAPENTIN 100 MG PO CAPS: 100.0000 mg | ORAL_CAPSULE | Freq: Three times a day (TID) | ORAL | 1 refills | Status: DC

## 2018-02-05 NOTE — Telephone Encounter (Signed)
Rx of Gabapentin 100mg  sent to pt's preferred pharmacy.  Called and spoke with pt letting her know this had been done.  Pt expressed understanding. Nothing further needed.

## 2018-02-18 ENCOUNTER — Telehealth: Payer: Self-pay | Admitting: Cardiology

## 2018-02-18 NOTE — Telephone Encounter (Signed)
Spoke with patient who has been at [employer] and eating out a lot lately.  She reports feeling like she had an asthma attack and used her nebulizer and inhaler.  She states it did help some.  She reports knowing she was holding onto extra fluid but did not have extra Torsemide with her.  She has been taking 20 mg once a day.  She got back home yesterday, took her normal 20 mg AM dose and another one later in the day.  Yesterday her wt was 172 lbs.  Today 166 lbs.  She has taken her AM dose today but is going to take a 2nd dose this afternoon.  She will continue to monitor s/s, weights and call back if no continued improvement.

## 2018-02-18 NOTE — Telephone Encounter (Signed)
New Message:     Pt have had a weight gain,swelling and short of breath. [name] will send a fax over with this information. Please be on the  look out for this fax.

## 2018-02-20 ENCOUNTER — Telehealth: Payer: Self-pay | Admitting: Cardiology

## 2018-02-20 DIAGNOSIS — I10 Essential (primary) hypertension: Secondary | ICD-10-CM

## 2018-02-20 DIAGNOSIS — Z5181 Encounter for therapeutic drug level monitoring: Secondary | ICD-10-CM

## 2018-02-20 DIAGNOSIS — I5032 Chronic diastolic (congestive) heart failure: Secondary | ICD-10-CM

## 2018-02-20 NOTE — Telephone Encounter (Signed)
New Message   Pt c/o swelling: STAT is pt has developed SOB within 24 hours  1) How much weight have you gained and in what time span? 5.6lb in 10 days   2) If swelling, where is the swelling located? Chronic swelling   3) Are you currently taking a fluid pill? yes  4) Are you currently SOB? Shortness   5) Do you have a log of your daily weights (if so, list)? 10/13 161.6, 10/21 166.4, 10/23 167.2   Have you gained 3 pounds in a day or 5 pounds in a week? Yes  6) Have you traveled recently? Yes, she went to the [facility] called on behalf of patient.

## 2018-02-20 NOTE — Telephone Encounter (Signed)
Thanks for update °Mark Skains, MD ° °

## 2018-02-20 NOTE — Telephone Encounter (Signed)
Spoke with pt on Monday RE: edema and SOB.  She reports [place] the beach with [place] and her wt was up to 172 lbs, having edema and some SOB.  She had taken extra Torsemide and wt was down to 166 lbs - edema some better.  Today [facility] called reporting pt's wt up 5.6 lbs ib 10 days.  Called and spoke with pt who reports she does still have some edema at ankles, some SOB and feels like the [facility] is "overacting."  Her wt today to 167 lbs.  Offered an appointment to come in for evaluation and treatment but pt reports she does not feel like she needs to do this at this time.  She will come in Friday for lab work to f/u renal function/K+ as she has been taking extra Torsemide.  She assures me she will c/b if s/s become worse.

## 2018-02-21 NOTE — Telephone Encounter (Signed)
Left CVM for [name] with [employer].  Advised that it appeared that nurse and Pt had discussed Pt's edema yesterday 02/20/2018 and decided she did not need an appt at this time.  Pt stated she would call if she needed to be seen.

## 2018-02-21 NOTE — Telephone Encounter (Signed)
Follow Up:    She wants you to call pt and talk to her and decides if pt needs to be seen tomorrow when she comes for her lab work. Pt weight is continuing to go up.

## 2018-02-22 ENCOUNTER — Encounter: Payer: Self-pay | Admitting: Primary Care

## 2018-02-22 ENCOUNTER — Ambulatory Visit (INDEPENDENT_AMBULATORY_CARE_PROVIDER_SITE_OTHER): Payer: Medicare Other | Admitting: Primary Care

## 2018-02-22 ENCOUNTER — Other Ambulatory Visit: Payer: Medicare Other | Admitting: *Deleted

## 2018-02-22 ENCOUNTER — Telehealth: Payer: Self-pay | Admitting: Internal Medicine

## 2018-02-22 VITALS — BP 140/86 | HR 84 | Temp 98.1°F | Ht 61.0 in | Wt 168.8 lb

## 2018-02-22 DIAGNOSIS — J4530 Mild persistent asthma, uncomplicated: Secondary | ICD-10-CM | POA: Diagnosis not present

## 2018-02-22 DIAGNOSIS — J209 Acute bronchitis, unspecified: Secondary | ICD-10-CM

## 2018-02-22 DIAGNOSIS — J45901 Unspecified asthma with (acute) exacerbation: Secondary | ICD-10-CM | POA: Diagnosis not present

## 2018-02-22 DIAGNOSIS — Z5181 Encounter for therapeutic drug level monitoring: Secondary | ICD-10-CM

## 2018-02-22 DIAGNOSIS — I10 Essential (primary) hypertension: Secondary | ICD-10-CM

## 2018-02-22 DIAGNOSIS — I5032 Chronic diastolic (congestive) heart failure: Secondary | ICD-10-CM

## 2018-02-22 LAB — BASIC METABOLIC PANEL
BUN / CREAT RATIO: 13 (ref 12–28)
BUN: 15 mg/dL (ref 8–27)
CO2: 24 mmol/L (ref 20–29)
Calcium: 8.1 mg/dL — ABNORMAL LOW (ref 8.7–10.3)
Chloride: 101 mmol/L (ref 96–106)
Creatinine, Ser: 1.14 mg/dL — ABNORMAL HIGH (ref 0.57–1.00)
GFR calc non Af Amer: 49 mL/min/{1.73_m2} — ABNORMAL LOW (ref >59)
GFR, EST AFRICAN AMERICAN: 57 mL/min/{1.73_m2} — AB (ref >59)
Glucose: 94 mg/dL (ref 65–99)
POTASSIUM: 4.2 mmol/L (ref 3.5–5.2)
SODIUM: 142 mmol/L (ref 134–144)

## 2018-02-22 MED ORDER — AMOXICILLIN-POT CLAVULANATE 875-125 MG PO TABS: 1.0000 | ORAL_TABLET | Freq: Two times a day (BID) | ORAL | 0 refills | Status: DC

## 2018-02-22 MED ORDER — BENZONATATE 100 MG PO CAPS: 200.0000 mg | ORAL_CAPSULE | Freq: Three times a day (TID) | ORAL | 1 refills | Status: DC

## 2018-02-22 MED ORDER — METHYLPREDNISOLONE ACETATE 80 MG/ML IJ SUSP
80.0000 mg | Freq: Once | INTRAMUSCULAR | Status: AC
  Administered 2018-02-22: 80 mg via INTRAMUSCULAR

## 2018-02-22 NOTE — Telephone Encounter (Signed)
Called pt to see if I could get more information for her in regards to symptoms.  Called and spoke with pt who stated she has been coughing x3-4 days but states she is not coughing up any mucus.  Pt stated she was using her neb machine twice a day to see if that would help and also stated she had to use her rescue inhaler at least twice.  Stated to pt we should get her scheduled for an appt. Pt expressed understanding. I scheduled her for an appt with Derl Barrow, NP today at 11:30.  Attempted to call [name] nurse case manager but unable to reach her. I left a detailed message on her machine stating that we did reach out to pt and have scheduled her an appt. nothing further needed.

## 2018-02-22 NOTE — Progress Notes (Signed)
@Patient  ID: [name], female    DOB: 12/26/1950, 67 y.o.   MRN: [id number]  Chief Complaint  Patient presents with  . Acute Visit    cough with yellow mucus at first, now dry, chest tighness and wheezing, SOB with exertion    Referring provider: [name], MD  HPI: 67 year old female, former smoker quit 2005. PMH CAD, grade 2 diastolic CHF, DM, HTN, asthma, VCD.   02/22/2018 Started coughing 3 days ago with yellow mucus. Cough is now dry. Associated nasal congestion/tenderness, sob on exertion, chest tightness.  States that [place] the beach for [place] and had recent sick contact.  Continues Pulmicort and Perforomist nebulizers twice daily.  She has been requiring albuterol nebulizer 1-2 times daily for the last 3 days.  She was out of work yesterday and today. Plans to return tomorrow.   Allergies  Allergen Reactions  . Amlodipine Besylate Other (See Comments)    Reaction unknown  . Lisinopril Other (See Comments)    Reaction unknown  . Pantoprazole Sodium Other (See Comments)    Reaction unknown    Immunization History  Administered Date(s) Administered  . Influenza Split 03/10/2012, 03/14/2015  . Influenza, High Dose Seasonal PF 02/04/2017, 01/04/2018  . Influenza-Unspecified 01/11/2016  . Zoster Recombinat (Shingrix) 06/11/2017, 08/28/2017, 09/27/2017    Past Medical History:  Diagnosis Date  . Anxiety   . Asthma   . Bronchitis   . Chronic diastolic CHF (congestive heart failure) ([place])   . Chronic diastolic CHF (congestive heart failure) ([place]) 07/17/2016  . Coronary artery disease    a. NSTEMI 12/2015 - LHC 01/18/16: s/p overlapping DESx2 to RCA, 10% ost-prox Cx,10% mLAD.  Marland Kitchen Depression   . Diabetes mellitus   . Dyslipidemia   . GERD (gastroesophageal reflux disease)   . Heart attack ([place])   . Hypertension   . Hypertensive heart disease   . NSTEMI (non-ST elevated myocardial infarction) ([place]) 12/2015  . Pneumonia     Tobacco History: Social  History   Tobacco Use  Smoking Status Former Smoker  . Packs/day: 1.00  . Years: 30.00  . Pack years: 30.00  . Types: Cigarettes  . Last attempt to quit: 01/09/2004  . Years since quitting: 14.1  Smokeless Tobacco Never Used   Counseling given: Not Answered   Outpatient Medications Prior to Visit  Medication Sig Dispense Refill  . albuterol (PROAIR HFA) 108 (90 Base) MCG/ACT inhaler Inhale 2 puffs into the lungs every 4 (four) hours as needed for wheezing or shortness of breath. 1 Inhaler 5  . albuterol (PROVENTIL) (2.5 MG/3ML) 0.083% nebulizer solution Take 2.5 mg by nebulization every 4 (four) hours as needed for wheezing or shortness of breath (((PLAN C))).    Marland Kitchen ALPRAZolam (XANAX) 0.5 MG tablet Take 1 tablet (0.5 mg total) by mouth 2 (two) times daily as needed for anxiety. 30 tablet 0  . aspirin EC 81 MG tablet Take 81 mg by mouth daily.     Marland Kitchen atorvastatin (LIPITOR) 40 MG tablet Take 40 mg by mouth at bedtime.    . Biotin 5000 MCG CAPS Take 1 capsule by mouth daily.    . budesonide (PULMICORT) 0.5 MG/2ML nebulizer solution Take 2 mLs (0.5 mg total) by nebulization 2 (two) times daily. 120 mL 0  . chlorpheniramine (CHLOR-TRIMETON) 4 MG tablet Take 4 mg by mouth every 4 (four) hours as needed for allergies.    Marland Kitchen diltiazem (CARDIZEM CD) 240 MG 24 hr capsule Take 1  capsule (240 mg total) by mouth daily. 90 capsule 3  . divalproex (DEPAKOTE ER) 500 MG 24 hr tablet Take 2 tablets (1,000 mg total) by mouth at bedtime. 60 tablet 11  . esomeprazole (NEXIUM) 40 MG capsule Take 1 capsule (40 mg total) by mouth 2 (two) times daily before a meal. 60 capsule 3  . estradiol (ESTRACE) 1 MG tablet Take 1 mg by mouth daily.     . fluticasone (FLONASE) 50 MCG/ACT nasal spray Place 2 sprays into both nostrils daily as needed for allergies or rhinitis.    . formoterol (PERFOROMIST) 20 MCG/2ML nebulizer solution Take 2 mLs (20 mcg total) by nebulization 2 (two) times daily. J41.0 250 mL 0  . gabapentin  (NEURONTIN) 100 MG capsule Take 1 capsule (100 mg total) by mouth 3 (three) times daily. 90 capsule 1  . guaiFENesin (MUCINEX) 600 MG 12 hr tablet Take 600 mg by mouth every 12 (twelve) hours as needed.    . hydrALAZINE (APRESOLINE) 25 MG tablet Take 1 tablet (25 mg total) by mouth 2 (two) times daily. 180 tablet 3  . metFORMIN (GLUCOPHAGE) 500 MG tablet Take 500 mg by mouth 2 (two) times daily with a meal.    . nebivolol (BYSTOLIC) 2.5 MG tablet Take 2.5 mg by mouth daily.    . nitroGLYCERIN (NITROSTAT) 0.4 MG SL tablet Place 1 tablet (0.4 mg total) under the tongue every 5 (five) minutes as needed for chest pain. x3 as needed 25 tablet 6  . potassium chloride SA (K-DUR,KLOR-CON) 20 MEQ tablet Take 20 mEq by mouth daily.     . ranitidine (ZANTAC) 150 MG tablet Take 150 mg by mouth at bedtime.     . Sodium Chloride-Sodium Bicarb (AYR SALINE NASAL RINSE) 1.57 g PACK Rinses as needed    . torsemide (DEMADEX) 20 MG tablet 3 tabs by mouth once daily    . predniSONE (DELTASONE) 10 MG tablet Take as directed 60 tablet 0  . dextromethorphan (DELSYM) 30 MG/5ML liquid 2 tsp every 12 hours as needed w/ flutter     No facility-administered medications prior to visit.     Review of Systems  Review of Systems  Constitutional: Negative.   HENT: Positive for sinus pressure and sinus pain.   Respiratory: Positive for cough, chest tightness and wheezing.   Cardiovascular: Negative.     Physical Exam  BP 140/86 (BP Location: Left Arm, Cuff Size: Normal)   Pulse 84   Temp 98.1 F (36.7 C)   Ht 5\' 1"  (1.549 m)   Wt 168 lb 12.8 oz (76.6 kg)   LMP  (LMP Unknown)   SpO2 100%   BMI 31.89 kg/m  Physical Exam  Constitutional: She is oriented to person, place, and time. She appears well-developed and well-nourished. No distress.  HENT:  Head: Normocephalic and atraumatic.  Eyes: Pupils are equal, round, and reactive to light. EOM are normal.  Neck: Normal range of motion. Neck supple.    Cardiovascular: Normal rate and regular rhythm.  Pulmonary/Chest: Effort normal. No respiratory distress. She has wheezes.  Tight/ wheeze   Musculoskeletal: Normal range of motion.  Neurological: She is alert and oriented to person, place, and time.  Skin: Skin is warm and dry.  Psychiatric: She has a normal mood and affect. Her behavior is normal. Judgment and thought content normal.     Lab Results:  CBC    Component Value Date/Time   WBC 14.1 (H) 12/24/2017 0217   RBC 3.39 (L) 12/24/2017 0217  HGB 10.8 (L) 12/24/2017 0217   HGB 11.2 09/17/2017 1550   HCT 32.2 (L) 12/24/2017 0217   HCT 34.2 09/17/2017 1550   PLT 323 12/24/2017 0217   PLT 338 09/17/2017 1550   MCV 95.0 12/24/2017 0217   MCV 95 09/17/2017 1550   MCH 31.9 12/24/2017 0217   MCHC 33.5 12/24/2017 0217   RDW 13.1 12/24/2017 0217   RDW 14.4 09/17/2017 1550   LYMPHSABS 1.3 12/23/2017 0239   LYMPHSABS 2.2 05/17/2017 1141   MONOABS 0.9 12/23/2017 0239   EOSABS 0.0 12/23/2017 0239   EOSABS 0.1 05/17/2017 1141   BASOSABS 0.0 12/23/2017 0239   BASOSABS 0.1 05/17/2017 1141    BMET    Component Value Date/Time   NA 134 (L) 12/24/2017 0217   NA 142 09/17/2017 1550   K 4.2 12/24/2017 0217   CL 94 (L) 12/24/2017 0217   CO2 30 12/24/2017 0217   GLUCOSE 189 (H) 12/24/2017 0217   BUN 38 (H) 12/24/2017 0217   BUN 28 (H) 09/17/2017 1550   CREATININE 1.35 (H) 12/24/2017 0217   CREATININE 1.06 (H) 01/26/2016 1608   CALCIUM 8.2 (L) 12/24/2017 0217   GFRNONAA 39 (L) 12/24/2017 0217   GFRAA 45 (L) 12/24/2017 0217    BNP    Component Value Date/Time   BNP 34.1 12/20/2017 0028   BNP <4.0 01/26/2016 1608    ProBNP    Component Value Date/Time   PROBNP 13.0 02/23/2016 1122    Imaging: No results found.   Assessment & Plan:   67 year old female, never smoker.  History of asthma and vocal cord dysfunction.  Presents today with complaints of sinusitis and bronchitis symptoms which have flared her breathing.   She has associated shortness of breath and wheezing.  Given Depo-Medrol in office today and will start course of Augmentin.  She declined prednisone taper today she will call if symptoms worsen or do not improve.  Continues Pulmicort and Perforomist nebulizers twice daily.  As needed albuterol 15 minutes after treatment as needed for shortness of breath or wheezing.  She will return to work tomorrow.  Acute bronchitis with asthma with acute exacerbation Office treatment: Depo-Medrol 80 mg IM  Rx: Augmentin 2 times daily for 7 days Tessalon Perles as needed 3 times a day for cough  Continue Pulmicort and Perforomist nebulizers twice daily Albuterol nebulizer 15 minutes after treatment as needed for shortness of breath or wheezing   Martyn Ehrich, NP 02/22/2018

## 2018-02-22 NOTE — Telephone Encounter (Signed)
Pt seen today by Geraldo Pitter, NP with pulmonary and is being treated for acute bronchitis with asthma.  Please see that note for further information.

## 2018-02-22 NOTE — Patient Instructions (Addendum)
Office treatment: Depo-Medrol 80 mg IM  Rx: Augmentin 2 times daily for 7 days Tessalon Perles as needed 3 times a day for cough  Continue Pulmicort and Perforomist nebulizers twice daily Albuterol nebulizer 15 minutes after treatment as needed for shortness of breath or wheezing  Please call/return if not feeling better in 1 week

## 2018-02-22 NOTE — Assessment & Plan Note (Signed)
Office treatment: Depo-Medrol 80 mg IM  Rx: Augmentin 2 times daily for 7 days Tessalon Perles as needed 3 times a day for cough  Continue Pulmicort and Perforomist nebulizers twice daily Albuterol nebulizer 15 minutes after treatment as needed for shortness of breath or wheezing

## 2018-02-25 ENCOUNTER — Telehealth: Payer: Self-pay | Admitting: *Deleted

## 2018-02-25 NOTE — Telephone Encounter (Signed)
-----   Message from Tanda Rockers, MD sent at 02/25/2018  5:51 AM EDT ----- Since having more flares needs f/u ov with me in 2 weeks with all meds in hand and any med calendar if she has one to correlate

## 2018-02-25 NOTE — Telephone Encounter (Signed)
Spoke with the pt and scheduled appt with MW for 03/11/18 advised to bring med cal

## 2018-02-25 NOTE — Progress Notes (Signed)
Chart and office note reviewed in detail  > agree with a/p as outlined    Will move up f/u to:  2 weeks  with all meds in hand using a trust but verify approach to confirm accurate Medication  Reconciliation The principal here is that until we are certain that the  patients are doing what we've asked, it makes no sense to ask them to do more.

## 2018-03-11 ENCOUNTER — Ambulatory Visit (INDEPENDENT_AMBULATORY_CARE_PROVIDER_SITE_OTHER): Payer: Medicare Other | Admitting: Internal Medicine

## 2018-03-11 ENCOUNTER — Encounter: Payer: Self-pay | Admitting: Internal Medicine

## 2018-03-11 ENCOUNTER — Telehealth: Payer: Self-pay | Admitting: Internal Medicine

## 2018-03-11 VITALS — BP 130/70 | HR 72 | Ht 61.0 in | Wt 164.0 lb

## 2018-03-11 DIAGNOSIS — R05 Cough: Secondary | ICD-10-CM

## 2018-03-11 DIAGNOSIS — R058 Other specified cough: Secondary | ICD-10-CM

## 2018-03-11 MED ORDER — PREDNISONE 10 MG PO TABS: ORAL_TABLET | ORAL | 0 refills | Status: DC

## 2018-03-11 MED ORDER — TRAMADOL HCL 50 MG PO TABS: 50.0000 mg | ORAL_TABLET | ORAL | 0 refills | Status: DC | PRN

## 2018-03-11 MED ORDER — GABAPENTIN 100 MG PO CAPS: 100.0000 mg | ORAL_CAPSULE | Freq: Four times a day (QID) | ORAL | 2 refills | Status: DC

## 2018-03-11 NOTE — Progress Notes (Signed)
Subjective:   Patient ID: [name], female    DOB: 01/02/1951    MRN: [id number]    Brief patient profile:  82 yobf quit smoking 2005 with nl pfts 2012  with recurrent non-specific resp flares with nl pfts during flares c/w pseudoasthma.        History of Present Illness  01/24/2016  Extended post hosp f/u ov/ transition of care/ /[name] re: dtca asthma/ vcd symb 160 and pred 5 x 10 mg daily  Chief Complaint  Patient presents with  . Follow-up    Pt. recently got out the [place] for an asthma attack, Pt. states her breathing remains the same, coughing with some yellow to white mucus,Has been needing to use Ventolin more often  last doing well 2 months prior to OV   While on Symb 160/ gerd rx but not dosing ac as rec  Waking up prematurely x 2 months with cough/wheeze/ sob but never contacted me as per the instructions rec Plan A = Automatic = dulera 100 Take 2 puffs first thing in am and then another 2 puffs about 12 hours later.                                      Nexum 40 mg Take 30-60 min before first meal of the day and Pepcid AC 20 mg  at bedtime Work on inhaler technique  Plan B = Backup Only use your albuterol as a rescue medication Plan C = Crisis - only use your albuterol nebulizer if you first try Plan B and it fails to help > ok to use the nebulizer up to every 4 hours but if start needing it regularly call for immediate appointment GERD diet  Take delsym two tsp every 12 hours and use the flutter valve as much as possible and supplement if needed with  tramadol 50 mg up to 2 every 4 hours to suppress the urge to cough  Once you have eliminated the cough for 3 straight days try reducing the tramadol first,  then the delsym as tolerated.   Prednisone 5 mg x 8, then 6, then 4 then 2, then 1 daily x 2 days and off  please schedule a follow up office visit in 2 weeks, sooner if needed with all meds/ inhalers/ neb solutions in hand     09/18/2016  f/u ov/[name] re:  UACS  vs Asthma brought med calendar but not updated / on symb 160 / max gerd rx  Chief Complaint  Patient presents with  . Follow-up    Pt c/o occ wheezing. She has not used rescue inhaler or neb recently.   still feels wheezing  And doe = MMRC1 = can walk nl pace, flat grade, can't hurry or go uphills or steps s sob rec Change the symbicort to 80 Take 2 puffs first thing in am and then another 2 puffs about 12 hours later.  Work on inhaler technique:  Use the med calendar daily to organize your medications as a "checklist" See [name] NP in 4  weeks with all your medications> never happened    Admit date: 12/19/2017 Discharge date: 12/24/2017  D/c home with steroids taper, monitor blood glucose while on steroids She reports run out of pulmicort nebs, new prescription provided, she is to follow up with Dr Melvyn Novas to further discuss asthma/copd meds, she is concerned about cost of these meds  Discharge Diagnoses:      Active Hospital Problems   Diagnosis Date Noted  . Acute bronchitis with asthma with acute exacerbation 01/16/2016  . Acute renal failure superimposed on stage 3 chronic kidney disease ([place]) 07/17/2016  . Chronic diastolic CHF (congestive heart failure) ([place]) 07/17/2016  . Acute respiratory failure with hypoxia ([place])   . Upper airway cough syndrome vs Asthma  08/28/2014  . Diabetes mellitus type 2, controlled ([place]) 08/06/2014  . Essential hypertension 07/21/2008           12/28/2017 ext post hosp f/u ov/[name] re: confused with meds again / only slightly better breathing  Chief Complaint  Patient presents with  . Follow-up    Breathing has improved slightly. She has been out of brovana due to high cost x 3 days. She has had to use her albuterol inhaler and neb daily since then.   Dyspnea:  50 ft = About the same as usual Cough: after supper is the worse, but not much mucus Sleeping: wakes up coughing 30 degrees due to sensation of reflux    SABA use: last 5 h prior to  OV   02: none  Sleeping at 30 degrees    rec Plan A = Automatic = Performist 20 mcg one vial  with pulmocort one half vial twice daily  Gabapentin 100 mg three times daily Nexium Take 30- 60 min before your first and last meals of the day and zantac (ranitidine) at bedtime  Along with chlorpheniramine 4 mg 1 or 2 also at bedtime (over the counter)  Continue prednisone 10 mg every day  Plan B = Backup for breathing Only use your albuterol as a rescue medication  Plan B = Backup for coughing  Add tramadol 50 mg 1-2 every 4 hours if needed  Plan C = Crisis - only use your albuterol nebulizer if you first try Plan B and it fails to help > ok to use the nebulizer up to every 4 hours but if start needing it regularly call for immediate appointment     03/11/2018  f/u ov/[name] re: 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 Follow-up    Cough had improved and then started back 1 day ago.  Cough is now non prod.  She is using her albuterol inhaler 2 x daily on average. She has not needed albuterol neb.    Dyspnea:  MMRC2 = can't walk a nl pace on a flat grade s sob but does fine slow and flat slowed by knee  Cough: noct > day  Sleeping: 45 degrees due to subjective reflux despite max acid suppresion  SABA use: as above - never noct 02: none     No obvious day to day or daytime variability or assoc excess/ purulent sputum or mucus plugs or hemoptysis or cp or chest tightness, subjective wheeze or overt sinus  symptoms.   Sleeping as above  without nocturnal  or early am exacerbation  of respiratory  c/o's or need for noct saba. Also denies any obvious fluctuation of symptoms with weather or environmental changes or other aggravating or alleviating factors except as outlined above   No unusual exposure hx or h/o childhood pna/ asthma or knowledge of premature birth.  Current Allergies, Complete Past Medical History, Past Surgical History, Family History, and Social  History were reviewed in [employer] record.  ROS  The following are not active complaints unless bolded Hoarseness, sore throat, dysphagia, dental problems, itching,  sneezing,  nasal congestion or discharge of excess mucus or purulent secretions, ear ache,   fever, chills, sweats, unintended wt loss or wt gain, classically pleuritic or exertional cp,  orthopnea pnd or arm/hand swelling  or leg swelling, presyncope, palpitations, abdominal pain, anorexia, nausea, vomiting, diarrhea  or change in bowel habits or change in bladder habits, change in stools or change in urine, dysuria, hematuria,  rash, arthralgias, visual complaints, headache, numbness, weakness or ataxia or problems with walking or coordination,  change in mood or  memory.        Current Meds  Medication Sig  . albuterol (PROAIR HFA) 108 (90 Base) MCG/ACT inhaler Inhale 2 puffs into the lungs every 4 (four) hours as needed for wheezing or shortness of breath.  Marland Kitchen albuterol (PROVENTIL) (2.5 MG/3ML) 0.083% nebulizer solution Take 2.5 mg by nebulization every 4 (four) hours as needed for wheezing or shortness of breath (((PLAN C))).  Marland Kitchen ALPRAZolam (XANAX) 0.5 MG tablet Take 1 tablet (0.5 mg total) by mouth 2 (two) times daily as needed for anxiety.  Marland Kitchen aspirin EC 81 MG tablet Take 81 mg by mouth daily.   Marland Kitchen atorvastatin (LIPITOR) 40 MG tablet Take 40 mg by mouth at bedtime.  . Biotin 5000 MCG CAPS Take 1 capsule by mouth daily.  . budesonide (PULMICORT) 0.5 MG/2ML nebulizer solution Take 2 mLs (0.5 mg total) by nebulization 2 (two) times daily.  . chlorpheniramine (CHLOR-TRIMETON) 4 MG tablet Take 4 mg by mouth every 4 (four) hours as needed for allergies.  Marland Kitchen dextromethorphan (DELSYM) 30 MG/5ML liquid 2 tsp every 12 hours as needed w/ flutter  . diltiazem (CARDIZEM CD) 240 MG 24 hr capsule Take 1 capsule (240 mg total) by mouth daily.  . divalproex (DEPAKOTE ER) 500 MG 24 hr tablet Take 2 tablets (1,000 mg total) by  mouth at bedtime.  Marland Kitchen esomeprazole (NEXIUM) 40 MG capsule Take 1 capsule (40 mg total) by mouth 2 (two) times daily before a meal.  . estradiol (ESTRACE) 1 MG tablet Take 1 mg by mouth daily.   . fluticasone (FLONASE) 50 MCG/ACT nasal spray Place 2 sprays into both nostrils daily as needed for allergies or rhinitis.  . formoterol (PERFOROMIST) 20 MCG/2ML nebulizer solution Take 2 mLs (20 mcg total) by nebulization 2 (two) times daily. J41.0  .     . guaiFENesin (MUCINEX) 600 MG 12 hr tablet Take 600 mg by mouth every 12 (twelve) hours as needed.  . hydrALAZINE (APRESOLINE) 25 MG tablet Take 1 tablet (25 mg total) by mouth 2 (two) times daily.  . metFORMIN (GLUCOPHAGE) 500 MG tablet Take 500 mg by mouth 2 (two) times daily with a meal.  . nebivolol (BYSTOLIC) 2.5 MG tablet Take 2.5 mg by mouth daily.  . nitroGLYCERIN (NITROSTAT) 0.4 MG SL tablet Place 1 tablet (0.4 mg total) under the tongue every 5 (five) minutes as needed for chest pain. x3 as needed  . potassium chloride SA (K-DUR,KLOR-CON) 20 MEQ tablet Take 20 mEq by mouth daily.   . ranitidine (ZANTAC) 150 MG tablet Take 150 mg by mouth at bedtime.   . torsemide (DEMADEX) 20 MG tablet 3 tabs by mouth once daily  . [DISCONTINUED] gabapentin (NEURONTIN) 100 MG capsule Take 1 capsule (100 mg total) by mouth 3 (three) times daily.               Objective:   Physical Exam    amb bf freq throat clearing    02/23/2016   168 >  04/07/2016  168 > 05/08/2016    168 >  07/17/2016  162 > 09/18/2016    164  > 09/18/2017  160 > 12/28/2017 167  > 03/11/2018  164   Vital signs reviewed - Note on arrival 02 sats  98% on RA        HEENT: nl dentition, turbinates bilaterally, and oropharynx. Nl external ear canals without cough reflex   NECK :  without JVD/Nodes/TM/ nl carotid upstrokes bilaterally   LUNGS: no acc muscle use,  Nl contour chest with classic psuedowheeze insp>> exp  without cough on insp or exp maneuvers   CV:  RRR  no s3 or  murmur or increase in P2, and no edema   ABD:  soft and nontender with nl inspiratory excursion in the supine position. No bruits or organomegaly appreciated, bowel sounds nl  MS:  Nl gait/ ext warm without deformities, calf tenderness, cyanosis or clubbing No obvious joint restrictions   SKIN: warm and dry without lesions    NEURO:  alert, approp, nl sensorium with  no motor or cerebellar deficits apparent.

## 2018-03-11 NOTE — Patient Instructions (Addendum)
Gabapentin 100 mg four  times daily   Take chortab 4 mg x 2 at bedtime and up to every 4 hours during the day as needed for drainage   Take delsym two tsp every 12 hours and supplement if needed with  tramadol 50 mg up to 2 every 4 hours to suppress the urge to cough. Swallowing water and/or using ice chips/non mint and menthol containing candies (such as lifesavers or sugarless jolly ranchers) are also effective.  You should rest your voice and avoid activities that you know make you cough.  Once you have eliminated the cough for 3 straight days try reducing the tramadol first,  then the delsym as tolerated.   Plan D = Prednisone x 6 days if not cough/ sob not better   See calendar for specific medication instructions and bring it back for each and every office visit for every healthcare provider you see.  Without it,  you may not receive the best quality medical care that we feel you deserve.  You will note that the calendar groups together  your maintenance  medications that are timed at particular times of the day.  Think of this as your checklist for what your doctor has instructed you to do until your next evaluation to see what benefit  there is  to staying on a consistent group of medications intended to keep you well.  The other group at the bottom is entirely up to you to use as you see fit  for specific symptoms that may arise between visits that require you to treat them on an as needed basis.  Think of this as your action plan or "what if" list.   Separating the top medications from the bottom group is fundamental to providing you adequate care going forward.    Please schedule a follow up office visit in 4 weeks, call sooner if needed with all medications /inhalers/ solutions in hand so we can verify exactly what you are taking. This includes all medications from all doctors and over the [place] separate them into two bags:  the ones you take automatically, no matter what, vs  the ones you take just when you feel you need them "BAG #2 is UP TO YOU"  - this will really help [place] help you take your medications more effectively.

## 2018-03-11 NOTE — Telephone Encounter (Signed)
Spoke with [name] at [facility]. She stated that the patient had been experiencing some increased SOB and is having to use her rescue inhaler more often. Despite this, patient is still able to work 5 days a week and has not reported any weight increase. [name] will fax over records. Advised her that I would get this records and give them to MW before her appt today at 1030. She verbalized understanding. Nothing further needed at time of call.

## 2018-03-13 ENCOUNTER — Encounter: Payer: Self-pay | Admitting: Internal Medicine

## 2018-03-13 NOTE — Assessment & Plan Note (Addendum)
-   CT sinus 08/06/14 > neg  - Allergy profile 02/23/16  >  Eos 0.0 /  IgE  87 pos  RAST  Grass/ trees  - improved 05/08/2016 on gabapentin 300 tid > no change rx > d/c'd 07/2016 DgEs 10/09/17  Low range of penetration without aspiration. Otherwise, normal - Asthma exac 12/17/17- FENO 18. Significant upper airway wheeze, tx pred taper. Add neurontin 100mg  TID.  - Spirometry 12/28/2017  FEV1 1.01 (63%)  Ratio 86 5 h p last saba with prominent wheeze on exam   - 12/28/2017 rec pred 10 mg as floor but weaned off        Strongly favor Upper airway cough syndrome (previously labeled PNDS),  is so named because it's frequently impossible to sort out how much is  CR/sinusitis with freq throat clearing (which can be related to primary GERD)   vs  causing  secondary (" extra esophageal")  GERD from wide swings in gastric pressure that occur with throat clearing, often  promoting self use of mint and menthol lozenges that reduce the lower esophageal sphincter tone and exacerbate the problem further in a cyclical fashion.   These are the same pts (now being labeled as having "irritable larynx syndrome" by some cough centers) who not infrequently have a history of having failed to tolerate ace inhibitors,  dry powder inhalers or biphosphonates or report having atypical/extraesophageal reflux symptoms that don't respond to standard doses of PPI  and are easily confused as having aecopd or asthma flares by even experienced allergists/ pulmonologists (myself included).   >>>> rec try max 1st gen H1 blockers per guidelines  And add tramadol if still needed to control cyclical cough not responding to gabapentin increased  To 100 mg qid and add added prednisone as "plan d" x 6 days only and off     I had an extended discussion with the patient reviewing all relevant studies completed to date and  lasting 15 to 20 minutes of a 25 minute visit    Each maintenance medication was reviewed in detail including most importantly  the difference between maintenance and prns and under what circumstances the prns are to be triggered using an action plan format that is not reflected in the computer generated alphabetically organized AVS but trather by a customized med calendar that reflects the AVS meds with confirmed 100% correlation.   In addition, Please see AVS for unique instructions that I personally wrote and verbalized to the the pt in detail and then reviewed with pt  by my nurse highlighting any  changes in therapy recommended at today's visit to their plan of care.     >>> f/u in 4 weeks with all meds in hand using a trust but verify approach to confirm accurate Medication  Reconciliation The principal here is that until we are certain that the  patients are doing what we've asked, it makes no sense to ask them to do more.

## 2018-03-15 ENCOUNTER — Encounter (HOSPITAL_COMMUNITY): Payer: Self-pay | Admitting: Emergency Medicine

## 2018-03-15 ENCOUNTER — Telehealth: Payer: Self-pay | Admitting: Internal Medicine

## 2018-03-15 ENCOUNTER — Observation Stay (HOSPITAL_COMMUNITY)
Admission: EM | Admit: 2018-03-15 | Discharge: 2018-03-18 | Disposition: A | Discharge status: Home / Self Care | Payer: Medicare Other | Attending: Internal Medicine | Admitting: Internal Medicine

## 2018-03-15 ENCOUNTER — Other Ambulatory Visit: Payer: Self-pay

## 2018-03-15 ENCOUNTER — Emergency Department (HOSPITAL_COMMUNITY): Payer: Medicare Other

## 2018-03-15 DIAGNOSIS — Z955 Presence of coronary angioplasty implant and graft: Secondary | ICD-10-CM | POA: Diagnosis not present

## 2018-03-15 DIAGNOSIS — I2510 Atherosclerotic heart disease of native coronary artery without angina pectoris: Secondary | ICD-10-CM | POA: Diagnosis not present

## 2018-03-15 DIAGNOSIS — I252 Old myocardial infarction: Secondary | ICD-10-CM | POA: Insufficient documentation

## 2018-03-15 DIAGNOSIS — J209 Acute bronchitis, unspecified: Secondary | ICD-10-CM | POA: Diagnosis present

## 2018-03-15 DIAGNOSIS — R569 Unspecified convulsions: Secondary | ICD-10-CM | POA: Insufficient documentation

## 2018-03-15 DIAGNOSIS — Z87891 Personal history of nicotine dependence: Secondary | ICD-10-CM | POA: Insufficient documentation

## 2018-03-15 DIAGNOSIS — G40909 Epilepsy, unspecified, not intractable, without status epilepticus: Secondary | ICD-10-CM

## 2018-03-15 DIAGNOSIS — K219 Gastro-esophageal reflux disease without esophagitis: Secondary | ICD-10-CM | POA: Insufficient documentation

## 2018-03-15 DIAGNOSIS — F418 Other specified anxiety disorders: Secondary | ICD-10-CM | POA: Diagnosis not present

## 2018-03-15 DIAGNOSIS — Z79890 Hormone replacement therapy (postmenopausal): Secondary | ICD-10-CM | POA: Insufficient documentation

## 2018-03-15 DIAGNOSIS — E785 Hyperlipidemia, unspecified: Secondary | ICD-10-CM | POA: Insufficient documentation

## 2018-03-15 DIAGNOSIS — R0602 Shortness of breath: Secondary | ICD-10-CM | POA: Diagnosis present

## 2018-03-15 DIAGNOSIS — Z7984 Long term (current) use of oral hypoglycemic drugs: Secondary | ICD-10-CM | POA: Insufficient documentation

## 2018-03-15 DIAGNOSIS — N1831 Chronic kidney disease, stage 3a: Secondary | ICD-10-CM

## 2018-03-15 DIAGNOSIS — N183 Chronic kidney disease, stage 3 unspecified: Secondary | ICD-10-CM

## 2018-03-15 DIAGNOSIS — I130 Hypertensive heart and chronic kidney disease with heart failure and stage 1 through stage 4 chronic kidney disease, or unspecified chronic kidney disease: Secondary | ICD-10-CM | POA: Insufficient documentation

## 2018-03-15 DIAGNOSIS — Z79899 Other long term (current) drug therapy: Secondary | ICD-10-CM | POA: Diagnosis not present

## 2018-03-15 DIAGNOSIS — I5032 Chronic diastolic (congestive) heart failure: Secondary | ICD-10-CM | POA: Diagnosis not present

## 2018-03-15 DIAGNOSIS — E1122 Type 2 diabetes mellitus with diabetic chronic kidney disease: Secondary | ICD-10-CM | POA: Insufficient documentation

## 2018-03-15 DIAGNOSIS — E1142 Type 2 diabetes mellitus with diabetic polyneuropathy: Secondary | ICD-10-CM | POA: Diagnosis not present

## 2018-03-15 DIAGNOSIS — J4541 Moderate persistent asthma with (acute) exacerbation: Secondary | ICD-10-CM | POA: Diagnosis not present

## 2018-03-15 DIAGNOSIS — Z7951 Long term (current) use of inhaled steroids: Secondary | ICD-10-CM | POA: Diagnosis not present

## 2018-03-15 DIAGNOSIS — I10 Essential (primary) hypertension: Secondary | ICD-10-CM | POA: Diagnosis present

## 2018-03-15 DIAGNOSIS — J383 Other diseases of vocal cords: Secondary | ICD-10-CM | POA: Insufficient documentation

## 2018-03-15 DIAGNOSIS — J45901 Unspecified asthma with (acute) exacerbation: Secondary | ICD-10-CM | POA: Diagnosis present

## 2018-03-15 DIAGNOSIS — Z7982 Long term (current) use of aspirin: Secondary | ICD-10-CM | POA: Insufficient documentation

## 2018-03-15 DIAGNOSIS — Z86711 Personal history of pulmonary embolism: Secondary | ICD-10-CM | POA: Insufficient documentation

## 2018-03-15 DIAGNOSIS — J9600 Acute respiratory failure, unspecified whether with hypoxia or hypercapnia: Secondary | ICD-10-CM | POA: Diagnosis not present

## 2018-03-15 DIAGNOSIS — E119 Type 2 diabetes mellitus without complications: Secondary | ICD-10-CM

## 2018-03-15 LAB — CBC WITH DIFFERENTIAL/PLATELET
Abs Immature Granulocytes: 0.04 10*3/uL (ref 0.00–0.07)
Basophils Absolute: 0.1 10*3/uL (ref 0.0–0.1)
Basophils Relative: 1 %
EOS ABS: 0.1 10*3/uL (ref 0.0–0.5)
EOS PCT: 0 %
HEMATOCRIT: 37.6 % (ref 36.0–46.0)
Hemoglobin: 11.2 g/dL — ABNORMAL LOW (ref 12.0–15.0)
Immature Granulocytes: 0 %
LYMPHS ABS: 4.4 10*3/uL — AB (ref 0.7–4.0)
Lymphocytes Relative: 30 %
MCH: 30.4 pg (ref 26.0–34.0)
MCHC: 29.8 g/dL — AB (ref 30.0–36.0)
MCV: 102.2 fL — AB (ref 80.0–100.0)
MONO ABS: 1.5 10*3/uL — AB (ref 0.1–1.0)
Monocytes Relative: 10 %
Neutro Abs: 8.7 10*3/uL — ABNORMAL HIGH (ref 1.7–7.7)
Neutrophils Relative %: 59 %
Platelets: 318 10*3/uL (ref 150–400)
RBC: 3.68 MIL/uL — ABNORMAL LOW (ref 3.87–5.11)
RDW: 13.8 % (ref 11.5–15.5)
WBC: 14.8 10*3/uL — ABNORMAL HIGH (ref 4.0–10.5)
nRBC: 0 % (ref 0.0–0.2)

## 2018-03-15 LAB — I-STAT VENOUS BLOOD GAS, ED
ACID-BASE EXCESS: 1 mmol/L (ref 0.0–2.0)
Bicarbonate: 28.6 mmol/L — ABNORMAL HIGH (ref 20.0–28.0)
O2 SAT: 71 %
PH VEN: 7.313 (ref 7.250–7.430)
TCO2: 30 mmol/L (ref 22–32)
pCO2, Ven: 56.3 mmHg (ref 44.0–60.0)
pO2, Ven: 42 mmHg (ref 32.0–45.0)

## 2018-03-15 LAB — I-STAT TROPONIN, ED: TROPONIN I, POC: 0 ng/mL (ref 0.00–0.08)

## 2018-03-15 LAB — COMPREHENSIVE METABOLIC PANEL
ALBUMIN: 3.6 g/dL (ref 3.5–5.0)
ALT: 15 U/L (ref 0–44)
ANION GAP: 13 (ref 5–15)
AST: 18 U/L (ref 15–41)
Alkaline Phosphatase: 48 U/L (ref 38–126)
BILIRUBIN TOTAL: 0.6 mg/dL (ref 0.3–1.2)
BUN: 20 mg/dL (ref 8–23)
CALCIUM: 8.8 mg/dL — AB (ref 8.9–10.3)
CO2: 24 mmol/L (ref 22–32)
Chloride: 102 mmol/L (ref 98–111)
Creatinine, Ser: 1.24 mg/dL — ABNORMAL HIGH (ref 0.44–1.00)
GFR, EST AFRICAN AMERICAN: 50 mL/min — AB (ref >60)
GFR, EST NON AFRICAN AMERICAN: 43 mL/min — AB (ref >60)
Glucose, Bld: 126 mg/dL — ABNORMAL HIGH (ref 70–99)
POTASSIUM: 3.4 mmol/L — AB (ref 3.5–5.1)
Sodium: 139 mmol/L (ref 135–145)
Total Protein: 7.1 g/dL (ref 6.5–8.1)

## 2018-03-15 LAB — BRAIN NATRIURETIC PEPTIDE: B NATRIURETIC PEPTIDE 5: 42.2 pg/mL (ref 0.0–100.0)

## 2018-03-15 LAB — GLUCOSE, CAPILLARY
Glucose-Capillary: 252 mg/dL — ABNORMAL HIGH (ref 70–99)
Glucose-Capillary: 264 mg/dL — ABNORMAL HIGH (ref 70–99)
Glucose-Capillary: 232 mg/dL — ABNORMAL HIGH (ref 70–99)

## 2018-03-15 MED ORDER — INSULIN ASPART 100 UNIT/ML ~~LOC~~ SOLN
0.0000 [IU] | Freq: Three times a day (TID) | SUBCUTANEOUS | Status: DC
  Administered 2018-03-15: 8 [IU] via SUBCUTANEOUS
  Administered 2018-03-16: 3 [IU] via SUBCUTANEOUS
  Administered 2018-03-16: 5 [IU] via SUBCUTANEOUS
  Administered 2018-03-16: 3 [IU] via SUBCUTANEOUS
  Administered 2018-03-17: 2 [IU] via SUBCUTANEOUS
  Administered 2018-03-17: 5 [IU] via SUBCUTANEOUS
  Administered 2018-03-18: 1 [IU] via SUBCUTANEOUS
  Administered 2018-03-18: 3 [IU] via SUBCUTANEOUS
  Filled 2018-03-15 (×2): qty 1

## 2018-03-15 MED ORDER — FAMOTIDINE 20 MG PO TABS
20.0000 mg | ORAL_TABLET | Freq: Every day | ORAL | Status: DC
  Administered 2018-03-15: 20 mg via ORAL
  Filled 2018-03-15: qty 1

## 2018-03-15 MED ORDER — ATORVASTATIN CALCIUM 40 MG PO TABS
40.0000 mg | ORAL_TABLET | Freq: Every day | ORAL | Status: DC
  Administered 2018-03-15 – 2018-03-17 (×3): 40 mg via ORAL
  Filled 2018-03-15 (×3): qty 1

## 2018-03-15 MED ORDER — PANTOPRAZOLE SODIUM 40 MG PO TBEC
80.0000 mg | DELAYED_RELEASE_TABLET | Freq: Every day | ORAL | Status: DC
  Administered 2018-03-16 – 2018-03-18 (×3): 80 mg via ORAL
  Filled 2018-03-15 (×3): qty 2

## 2018-03-15 MED ORDER — ALBUTEROL SULFATE (2.5 MG/3ML) 0.083% IN NEBU
5.0000 mg | INHALATION_SOLUTION | Freq: Once | RESPIRATORY_TRACT | Status: AC
  Administered 2018-03-15: 5 mg via RESPIRATORY_TRACT
  Filled 2018-03-15: qty 6

## 2018-03-15 MED ORDER — NITROGLYCERIN 0.4 MG SL SUBL: 0.4000 mg | SUBLINGUAL_TABLET | SUBLINGUAL | Status: DC | PRN

## 2018-03-15 MED ORDER — PREDNISONE 20 MG PO TABS
40.0000 mg | ORAL_TABLET | Freq: Every day | ORAL | Status: DC
  Filled 2018-03-15: qty 2

## 2018-03-15 MED ORDER — HYDRALAZINE HCL 25 MG PO TABS
25.0000 mg | ORAL_TABLET | Freq: Two times a day (BID) | ORAL | Status: DC
  Administered 2018-03-15 – 2018-03-18 (×6): 25 mg via ORAL
  Filled 2018-03-15 (×7): qty 1

## 2018-03-15 MED ORDER — ALBUTEROL SULFATE HFA 108 (90 BASE) MCG/ACT IN AERS: 2.0000 | INHALATION_SPRAY | RESPIRATORY_TRACT | Status: DC | PRN

## 2018-03-15 MED ORDER — ALPRAZOLAM 0.5 MG PO TABS
0.5000 mg | ORAL_TABLET | Freq: Two times a day (BID) | ORAL | Status: DC | PRN
  Administered 2018-03-15: 0.5 mg via ORAL
  Filled 2018-03-15: qty 1

## 2018-03-15 MED ORDER — GABAPENTIN 100 MG PO CAPS
100.0000 mg | ORAL_CAPSULE | Freq: Four times a day (QID) | ORAL | Status: DC
  Administered 2018-03-15 – 2018-03-18 (×12): 100 mg via ORAL
  Filled 2018-03-15 (×12): qty 1

## 2018-03-15 MED ORDER — ENOXAPARIN SODIUM 40 MG/0.4ML ~~LOC~~ SOLN
40.0000 mg | SUBCUTANEOUS | Status: DC
  Administered 2018-03-15 – 2018-03-17 (×3): 40 mg via SUBCUTANEOUS
  Filled 2018-03-15 (×3): qty 0.4

## 2018-03-15 MED ORDER — INSULIN ASPART 100 UNIT/ML ~~LOC~~ SOLN
0.0000 [IU] | Freq: Every day | SUBCUTANEOUS | Status: DC
  Administered 2018-03-15 – 2018-03-17 (×3): 2 [IU] via SUBCUTANEOUS
  Filled 2018-03-15: qty 1

## 2018-03-15 MED ORDER — ARFORMOTEROL TARTRATE 15 MCG/2ML IN NEBU
15.0000 ug | INHALATION_SOLUTION | Freq: Two times a day (BID) | RESPIRATORY_TRACT | Status: DC
  Administered 2018-03-15 – 2018-03-16 (×2): 15 ug via RESPIRATORY_TRACT
  Filled 2018-03-15 (×2): qty 2

## 2018-03-15 MED ORDER — ALBUTEROL SULFATE (2.5 MG/3ML) 0.083% IN NEBU: 5.0000 mg | INHALATION_SOLUTION | RESPIRATORY_TRACT | Status: DC | PRN

## 2018-03-15 MED ORDER — TRAMADOL HCL 50 MG PO TABS
50.0000 mg | ORAL_TABLET | ORAL | Status: DC | PRN
  Administered 2018-03-15 – 2018-03-18 (×7): 50 mg via ORAL
  Filled 2018-03-15 (×7): qty 1

## 2018-03-15 MED ORDER — DEXTROMETHORPHAN POLISTIREX ER 30 MG/5ML PO SUER
15.0000 mg | Freq: Two times a day (BID) | ORAL | Status: DC | PRN
  Filled 2018-03-15: qty 5

## 2018-03-15 MED ORDER — TORSEMIDE 20 MG PO TABS
20.0000 mg | ORAL_TABLET | Freq: Every day | ORAL | Status: DC
  Administered 2018-03-16 – 2018-03-18 (×3): 20 mg via ORAL
  Filled 2018-03-15 (×3): qty 1

## 2018-03-15 MED ORDER — SODIUM CHLORIDE 0.9% FLUSH
3.0000 mL | Freq: Two times a day (BID) | INTRAVENOUS | Status: DC
  Administered 2018-03-15 – 2018-03-18 (×5): 3 mL via INTRAVENOUS

## 2018-03-15 MED ORDER — BIOTIN 5000 MCG PO CAPS: 1.0000 | ORAL_CAPSULE | Freq: Every day | ORAL | Status: DC

## 2018-03-15 MED ORDER — ALBUTEROL SULFATE (2.5 MG/3ML) 0.083% IN NEBU
5.0000 mg | INHALATION_SOLUTION | RESPIRATORY_TRACT | Status: DC
  Administered 2018-03-15 – 2018-03-16 (×4): 5 mg via RESPIRATORY_TRACT
  Filled 2018-03-15 (×4): qty 6

## 2018-03-15 MED ORDER — DIVALPROEX SODIUM ER 500 MG PO TB24
1000.0000 mg | ORAL_TABLET | Freq: Every day | ORAL | Status: DC
  Administered 2018-03-15 – 2018-03-17 (×3): 1000 mg via ORAL
  Filled 2018-03-15 (×3): qty 2

## 2018-03-15 MED ORDER — NEBIVOLOL HCL 5 MG PO TABS
2.5000 mg | ORAL_TABLET | Freq: Every day | ORAL | Status: DC
  Administered 2018-03-16 – 2018-03-18 (×3): 2.5 mg via ORAL
  Filled 2018-03-15 (×4): qty 1

## 2018-03-15 MED ORDER — ESTRADIOL 1 MG PO TABS
1.0000 mg | ORAL_TABLET | Freq: Every day | ORAL | Status: DC
  Administered 2018-03-16 – 2018-03-18 (×3): 1 mg via ORAL
  Filled 2018-03-15 (×3): qty 1

## 2018-03-15 MED ORDER — DILTIAZEM HCL ER COATED BEADS 240 MG PO CP24
240.0000 mg | ORAL_CAPSULE | Freq: Every day | ORAL | Status: DC
  Administered 2018-03-16 – 2018-03-18 (×3): 240 mg via ORAL
  Filled 2018-03-15: qty 1
  Filled 2018-03-15: qty 2
  Filled 2018-03-15: qty 1

## 2018-03-15 MED ORDER — GUAIFENESIN ER 600 MG PO TB12
600.0000 mg | ORAL_TABLET | Freq: Two times a day (BID) | ORAL | Status: DC | PRN
  Administered 2018-03-16: 600 mg via ORAL
  Filled 2018-03-15: qty 1

## 2018-03-15 MED ORDER — ASPIRIN EC 81 MG PO TBEC
81.0000 mg | DELAYED_RELEASE_TABLET | Freq: Every day | ORAL | Status: DC
  Administered 2018-03-16 – 2018-03-18 (×3): 81 mg via ORAL
  Filled 2018-03-15 (×3): qty 1

## 2018-03-15 MED ORDER — POTASSIUM CHLORIDE CRYS ER 20 MEQ PO TBCR
20.0000 meq | EXTENDED_RELEASE_TABLET | Freq: Every day | ORAL | Status: DC
  Administered 2018-03-16 – 2018-03-18 (×3): 20 meq via ORAL
  Filled 2018-03-15 (×3): qty 1

## 2018-03-15 NOTE — ED Notes (Addendum)
Ambulated pt, sats maintained >95% on RA but pt very winded afterward

## 2018-03-15 NOTE — Telephone Encounter (Signed)
Spoke with pt, she is wheezing, coughing, chest pain. And wanted to come in for an appt. Due to the move we don't have any appointments. I suggested she go to the ER because I could hear her wheezing through the phone and was concerned that she is having an exacerbation that needs immediate attention. Pt agreed and stated she would call EMS now. Nothing further is needed.

## 2018-03-15 NOTE — ED Triage Notes (Signed)
Pt in from home via [facility] with sob and cough x 2 days. C/o some chest tightness today. States she has been taking Prednisone recently, and dyspnea has worsened with the cold weather changes. Given 1mg  Atrovent and 5mg  Albuterol en route, as well as 2g Mag IV and 125mg  Solumedrol. A&ox4, able to speak in short sentences, audible wheezes. Hx of CHF, sats 100% on duoneb

## 2018-03-15 NOTE — H&P (Signed)
History and Physical    [name] WRU:[id number] DOB: [birth year] DOA: 03/15/2018  PCP: [name], MD Consultants: None Patient coming from: Home  Chief Complaint: Shortness of breath  HPI: [name] is a 67 y.o. female with medical history significant for chronic diastolic CHF, CAD, diabetes, prior PE, long-standing history of asthma vs UACS with multiple admissions, presenting with shortness of breath.  Patient states that she has had increased shortness of breath and audible wheezing for the last 3 to 4 days which has been worsening.  Symptoms started with a sore throat and nasal congestion.  The symptoms have resolved but she now has increased cough, shortness of breath, wheezing.  She has been using albuterol 3-4 times a day in addition to her usual nebulizers twice daily.  She was seen in clinic on Monday, November 11 and given a shot of steroids. She has been on a steroid taper since that time but has not noticed any improvement.  She has not had any fevers or chills.  She has had a cough that is productive of occasional thin yellow sputum but this is baseline for her.  This morning her shortness of breath got bad enough that she called EMS.  She was given 15 mg of albuterol and 1 mg of Atrovent along with 125 mg of Solu-Medrol and 2 g of mag sulfate in route.  She denies chest pain except for when she is coughing. She has had no increase in her lower extremity edema and has not gained any weight.   She is compliant with all of her medications and with a low-sodium diet.  She quit smoking in 2005.  She has been spending a lot of time around her great-grandchildren most of whom are under the age of 42.  ED Course:  Patient was given DuoNeb.  Venous blood gas was done showing a pH of 7.31 and a PCO2 of 56.3.  He stated she felt improved from this morning.  She attempted to ambulate but had increased shortness of breath and wheezing although she did not desaturate.  Tried hospitalists  called for admission.  Review of Systems: As per HPI; otherwise review of systems reviewed and negative.   Ambulatory Status:  Ambulates without assistance  Past Medical History:  Diagnosis Date  . Anxiety   . Asthma   . Bronchitis   . Chronic diastolic CHF (congestive heart failure) ([place])   . Chronic diastolic CHF (congestive heart failure) ([place]) 07/17/2016  . Coronary artery disease    a. NSTEMI 12/2015 - LHC 01/18/16: s/p overlapping DESx2 to RCA, 10% ost-prox Cx,10% mLAD.  Marland Kitchen Depression   . Diabetes mellitus   . Dyslipidemia   . GERD (gastroesophageal reflux disease)   . Heart attack ([place])   . Hypertension   . Hypertensive heart disease   . NSTEMI (non-ST elevated myocardial infarction) ([place]) 12/2015  . Pneumonia     Past Surgical History:  Procedure Laterality Date  . ABDOMINAL HYSTERECTOMY    . CARDIAC CATHETERIZATION  1994   minimal LAD dz, no other dz, EF normal  . CARDIAC CATHETERIZATION N/A 01/18/2016   Procedure: Left Heart Cath and Coronary Angiography;  Surgeon: Burnell Blanks, MD;  Location: [place] [facility];  Service: Cardiovascular;  Laterality: N/A;  . CARDIAC CATHETERIZATION N/A 01/18/2016   Procedure: Coronary Stent Intervention;  Surgeon: Burnell Blanks, MD;  Location: [place] [facility];  Service: Cardiovascular;  Laterality: N/A;  . COLONOSCOPY WITH PROPOFOL N/A 04/09/2014  Procedure: COLONOSCOPY WITH PROPOFOL;  Surgeon: Cleotis Nipper, MD;  Location: [facility];  Service: Endoscopy;  Laterality: N/A;  . CORONARY STENT PLACEMENT  01/19/2016   STENT SYNERGY DES 4.27C62 drug eluting stent was successfully placed, and overlaps the 2.5 x 38 mm Synergy stent placed distally.  . ESOPHAGOGASTRODUODENOSCOPY (EGD) WITH PROPOFOL N/A 04/09/2014   Procedure: ESOPHAGOGASTRODUODENOSCOPY (EGD) WITH PROPOFOL;  Surgeon: Cleotis Nipper, MD;  Location: [facility];  Service: Endoscopy;  Laterality: N/A;  . HEMORRHOID SURGERY    . HERNIA REPAIR    .  KNEE ARTHROSCOPY      Social History   Socioeconomic History  . Marital status: Married    Spouse name: Not on file  . Number of children: 3  . Years of education: some college  . Highest education level: Not on file  Occupational History  . Occupation: Education officer, community  . Financial resource strain: Not on file  . Food insecurity:    Worry: Not on file    Inability: Not on file  . Transportation needs:    Medical: Not on file    Non-medical: Not on file  Tobacco Use  . Smoking status: Former Smoker    Packs/day: 1.00    Years: 30.00    Pack years: 30.00    Types: Cigarettes    Last attempt to quit: 01/09/2004    Years since quitting: 14.1  . Smokeless tobacco: Never Used  Substance and Sexual Activity  . Alcohol use: No    Alcohol/week: 0.0 standard drinks  . Drug use: No  . Sexual activity: Not on file  Lifestyle  . Physical activity:    Days per week: Not on file    Minutes per session: Not on file  . Stress: Not on file  Relationships  . Social connections:    Talks on phone: Not on file    Gets together: Not on file    Attends religious service: Not on file    Active member of club or organization: Not on file    Attends meetings of clubs or organizations: Not on file    Relationship status: Not on file  . Intimate partner violence:    Fear of current or ex partner: Not on file    Emotionally abused: Not on file    Physically abused: Not on file    Forced sexual activity: Not on file  Other Topics Concern  . Not on file  Social History Narrative   Pt lives with husband in [place].   Right-handed.   2 cups caffeine per day.    Allergies  Allergen Reactions  . Amlodipine Besylate Other (See Comments)    Reaction unknown  . Lisinopril Other (See Comments)    Reaction unknown  . Pantoprazole Sodium Other (See Comments)    Reaction unknown    Family History  Problem Relation Age of Onset  . Allergies Mother   . Asthma Mother   .  CAD Father 7  . Diabetes Father   . Hypertension Father   . Congestive Heart Failure Sister        died in her 28s  . CAD Sister 65    Prior to Admission medications   Medication Sig Start Date End Date Taking? Authorizing Provider  albuterol (PROAIR HFA) 108 (90 Base) MCG/ACT inhaler Inhale 2 puffs into the lungs every 4 (four) hours as needed for wheezing or shortness of breath. 06/29/17  Yes Hayden Rasmussen, MD  albuterol (PROVENTIL) (  2.5 MG/3ML) 0.083% nebulizer solution Take 2.5 mg by nebulization every 4 (four) hours as needed for wheezing or shortness of breath (((PLAN C))).   Yes [provider]  ALPRAZolam (XANAX) 0.5 MG tablet Take 1 tablet (0.5 mg total) by mouth 2 (two) times daily as needed for anxiety. 07/31/15  Yes Theodis Blaze, MD  aspirin EC 81 MG tablet Take 81 mg by mouth daily.    Yes [provider]  atorvastatin (LIPITOR) 40 MG tablet Take 40 mg by mouth at bedtime.   Yes [provider]  Biotin 5000 MCG CAPS Take 1 capsule by mouth daily.   Yes [provider]  budesonide (PULMICORT) 0.5 MG/2ML nebulizer solution Take 2 mLs (0.5 mg total) by nebulization 2 (two) times daily. 12/24/17  Yes Florencia Reasons, MD  dextromethorphan (DELSYM) 30 MG/5ML liquid 2 tsp every 12 hours as needed w/ flutter   Yes [provider]  diltiazem (CARDIZEM CD) 240 MG 24 hr capsule Take 1 capsule (240 mg total) by mouth daily. 09/17/17  Yes Burtis Junes, NP  divalproex (DEPAKOTE ER) 500 MG 24 hr tablet Take 2 tablets (1,000 mg total) by mouth at bedtime. 05/29/17  Yes Dennie Bible, NP  esomeprazole (NEXIUM) 40 MG capsule Take 1 capsule (40 mg total) by mouth 2 (two) times daily before a meal. 10/18/17  Yes Rai, Ripudeep K, MD  estradiol (ESTRACE) 1 MG tablet Take 1 mg by mouth daily.    Yes [provider]  fluticasone (FLONASE) 50 MCG/ACT nasal spray Place 2 sprays into both nostrils daily as needed for allergies or rhinitis.   Yes  [provider]  formoterol (PERFOROMIST) 20 MCG/2ML nebulizer solution Take 2 mLs (20 mcg total) by nebulization 2 (two) times daily. J41.0 12/28/17  Yes Tanda Rockers, MD  gabapentin (NEURONTIN) 100 MG capsule Take 1 capsule (100 mg total) by mouth 4 (four) times daily. 03/11/18  Yes Tanda Rockers, MD  guaiFENesin (MUCINEX) 600 MG 12 hr tablet Take 600 mg by mouth every 12 (twelve) hours as needed.   Yes [provider]  hydrALAZINE (APRESOLINE) 25 MG tablet Take 1 tablet (25 mg total) by mouth 2 (two) times daily. 09/17/17  Yes Burtis Junes, NP  metFORMIN (GLUCOPHAGE) 500 MG tablet Take 500 mg by mouth 2 (two) times daily with a meal.   Yes [provider]  nebivolol (BYSTOLIC) 2.5 MG tablet Take 2.5 mg by mouth daily.   Yes [provider]  nitroGLYCERIN (NITROSTAT) 0.4 MG SL tablet Place 1 tablet (0.4 mg total) under the tongue every 5 (five) minutes as needed for chest pain. x3 as needed 09/17/17  Yes Gerhardt, Marlane Hatcher, NP  potassium chloride SA (K-DUR,KLOR-CON) 20 MEQ tablet Take 20 mEq by mouth daily.    Yes [provider]  ranitidine (ZANTAC) 150 MG tablet Take 150 mg by mouth at bedtime.    Yes [provider]  torsemide (DEMADEX) 20 MG tablet Take 20-60 mg by mouth daily. 1-3 tabs (20mg  -60mg ) by mouth once daily 10/25/16  Yes [provider]  traMADol (ULTRAM) 50 MG tablet Take 1 tablet (50 mg total) by mouth every 4 (four) hours as needed for up to 5 days (for cough or pain). 03/11/18 03/16/18 Yes Tanda Rockers, MD  predniSONE (DELTASONE) 10 MG tablet Take  4 each am x 2 days,   2 each am x 2 days,  1 each am x 2 days and stop 03/11/18  Tanda Rockers, MD    Physical Exam: Vitals:   03/15/18 1130 03/15/18 1200 03/15/18 1215 03/15/18 1230  BP:  (!) 152/82 (!) 152/76 (!) 146/79  Pulse: 89 89 87 89  Resp: 20     Temp:      TempSrc:      SpO2: 97% 96% 93% 95%  Weight:         . General: Appears calm and  comfortable and is in NAD . Eyes:  PERRL, EOMI, normal lids, iris . ENT:  grossly normal hearing, lips & tongue, mmm . Neck:  supple, no lymphadenopathy . Cardiovascular:  nL S1, S2, normal rate, reg rhythm, no murmur. Marland Kitchen Respiratory: Mildly increased work of breathing.  Able to speak in short sentences.  Lung sounds are diminished throughout, expiratory wheezes present in all fields, worse on the right.  . Abdomen:  soft, NT, ND, NABS . Back:   grossly normal alignment . Skin:  no rash or lesions seen on limited exam . Musculoskeletal:  grossly normal tone BUE/BLE, good ROM, no bony abnormality or obvious joint deformity . Lower extremities: Trace LE edema.  Limited foot exam with no ulcerations.  2+ distal pulses. Marland Kitchen Psychiatric:  grossly normal mood and affect, speech fluent and appropriate, AOx3 . Neurologic:  CN 2-12 grossly intact, moves all extremities in coordinated fashion, sensation intact, Patellar DTRs 2+ and symmetric    Radiological Exams on Admission: Dg Chest Port 1 View  Result Date: 03/15/2018 CLINICAL DATA:  Two day history of shortness of breath and cough. Chest tightness today. History of chronic asthma, CHF, hypertension, diabetes, former smoker. EXAM: PORTABLE CHEST 1 VIEW COMPARISON:  Portable chest x-ray of December 19, 2017 FINDINGS: The lungs are adequately inflated. The lung markings are coarse at the left lung base but this is stable. The cardiac silhouette is top-normal in size. The pulmonary vascularity is normal. The trachea is midline. The bony thorax exhibits no acute abnormality. IMPRESSION: Mild chronic bronchitic reactive airway changes with scarring at the left lung base. No alveolar pneumonia nor CHF. Electronically Signed   By: David  Martinique M.D.   On: 03/15/2018 10:57    EKG: Independently reviewed.   Date/Time:                  Friday March 15 2018 10:38:52 EST Ventricular Rate:         90 PR Interval:                   QRS Duration: 90 QT  Interval:                 369 QTC Calculation:        452 R Axis:                         -11 Text Interpretation:       Sinus rhythm Borderline T abnormalities, diffuse leads    Labs on Admission: I have personally reviewed the available labs and imaging studies at the time of the admission.  Pertinent labs:  VBG: 7.313/56.3/42/28.6 Sodium 139 potassium 3.4 chloride 102 CO2 24 BUN 20 creatinine 1.24 which is her baseline glucose 126 calcium 8.8 alkaline phosphatase 48 albumin 3.6 AST 18 ALT 15 total protein 7.1 total bilirubin 0.6 BNP 42.2 Troponin 0 0.00 WBC 14.8 hemoglobin 11.2 MCV 102.2 platelets 318  Pulmonary function studies December 28, 2017: FEV1: 63%, moderately severe restriction  Most recent echo August 13, 2017: LVEF 65 to 70%.  Moderate concentric hypertrophy.  Normal wall motion.  Pseudo-normal left ventricular filling with concomitant abnormal relaxation and increased filling pressure (grade 2 diastolic dysfunction) high ventricular filling pressure trivial mitral valve regurgitation PA peak pressure 34 mmHg   Assessment/Plan Principal Problem:   Acute bronchitis with asthma with acute exacerbation Active Problems:   Depression with anxiety   Essential hypertension   Asthma exacerbation   CAD in native artery   Seizures (HCC)   Chronic diastolic CHF (congestive heart failure) (HCC)   DM2 (diabetes mellitus, type 2) (HCC)   CKD (chronic kidney disease), stage III (HCC)   Acute bronchitis with asthma with moderate-severe acute exacerbation.  This likely began as viral URI/bronchitis which triggered asthma flare.  She is somewhat improved after 20 mg of albuterol and 1 mg of Atrovent, 125 mg of Solu-Medrol and 2 g of mag sulfate. -cont albuterol / atrovent nebs q4h scheduled, q2h prn -Continue formoterol -Status post 1 dose of IV Solu-Medrol 125 mg today.  Start prednisone 40 mg daily tomorrow. -Supplemental oxygen to keep sats above 92% -Respiratory therapy eval and  treat -Closely monitor clinical status and repeat VBG -No evidence of bacterial infection / PNA; no indication for abx  Chronic diastolic CHF, not in exacerbation today. -Continue home dose of torsemide, KCl -Low-sodium diet  Essential hypertension -Continue home hydralazine 25 mg twice daily, Bystolic 2.5 mg daily, torsemide 20 mg daily, KCl  Diabetes mellitus type 2, non-insulin-dependent -Hold metformin while inpatient -Carb controlled diet -SSI  Diabetic neuropathy -Continue home Ultram, gabapentin  Coronary artery disease -Continue Lipitor, Bystolic, baby aspirin, hydralazine  History of seizures -Continue Depakote 1000 mg nightly    DVT prophylaxis: lovenox Code Status:  Full - confirmed with patient/family Family Communication: daughter at bedside  Disposition Plan:  Home once clinically improved Consults called: none  Admission status: It is my clinical opinion that referral for OBSERVATION is reasonable and necessary in this patient based on the above information provided. The aforementioned taken together are felt to place the patient at high risk for further clinical deterioration. However it is anticipated that the patient may be medically stable for discharge from the hospital within 24 to 48 hours.     Janora Norlander MD [facility]  If note is complete, please contact covering daytime or nighttime physician. [email or web address] Password [email or web address]  03/15/2018, 1:16 PM

## 2018-03-15 NOTE — ED Provider Notes (Signed)
[facility] EMERGENCY DEPARTMENT Provider Note   CSN: [id number] Arrival date & time: 03/15/18  1028     History   Chief Complaint Chief Complaint  Patient presents with  . Shortness of Breath    HPI [name] is a 67 y.o. female history of diastolic CHF, CAD, diabetes, long-standing history of asthma with multiple admissions, presenting with shortness of breath, possible asthma exacerbation.  Patient states that for the last 3 to 4 days, she has been short of breath and coughing.  She states that she hears herself wheezing and has been using albuterol 2-3 times a day.  She states that this morning, she has been more short of breath so called EMS.  Fire department got there, she has diffuse wheezing so she was given 5 mg of albuterol x2 and 0.5 mg Atrovent.  When EMS got there, she received another 5 mg of albuterol and another 0.5 mg Atrovent and also received 125 mg of Solu-Medrol and 2 g of magnesium.  Patient states that she has multiple admissions for asthma exacerbation and she does not smoke.  She has some chest pressure only when she coughs but denies any chest pain when she is not coughing.  Patient denies any recent travel.  Patient did have previous blood clots but is not on blood thinners currently.  Patient denies any calf pain or swelling.  Patient does have a history of diastolic CHF but denies any weight gain or leg swelling.  The history is provided by the patient.    Past Medical History:  Diagnosis Date  . Anxiety   . Asthma   . Bronchitis   . Chronic diastolic CHF (congestive heart failure) ([place])   . Chronic diastolic CHF (congestive heart failure) ([place]) 07/17/2016  . Coronary artery disease    a. NSTEMI 12/2015 - [facility] 01/18/16: s/p overlapping DESx2 to RCA, 10% ost-prox Cx,10% mLAD.  Marland Kitchen Depression   . Diabetes mellitus   . Dyslipidemia   . GERD (gastroesophageal reflux disease)   . Heart attack ([place])   . Hypertension   . Hypertensive heart  disease   . NSTEMI (non-ST elevated myocardial infarction) ([place]) 12/2015  . Pneumonia     Patient Active Problem List   Diagnosis Date Noted  . Dyspnea 08/12/2017  . Macrocytic anemia 08/12/2017  . Therapeutic drug monitoring 05/17/2017  . Lung nodule 08/01/2016  . Influenza B 07/18/2016  . Chronic diastolic CHF (congestive heart failure) ([place]) 07/17/2016  . Acute renal failure superimposed on stage 3 chronic kidney disease ([place]) 07/17/2016  . Right leg swelling 02/27/2016  . Acute respiratory failure with hypoxia ([place])   . Uncontrolled type 2 diabetes mellitus with complication ([place])   . Acute pulmonary embolism ([place])   . HLD (hyperlipidemia)   . Seizures ([place]) 01/27/2016  . Numbness 01/27/2016  . CAD in native artery 01/20/2016  . Hypertensive heart disease 01/20/2016  . NSTEMI (non-ST elevated myocardial infarction) ([place]) 01/18/2016  . Acute bronchitis with asthma with acute exacerbation 01/16/2016  . Hypokalemia 01/16/2016  . Hypomagnesemia 01/16/2016  . Acute respiratory failure ([place]) 01/16/2016  . Dyspnea on exertion   . Asthma exacerbation 07/21/2015  . Upper airway cough syndrome vs Asthma  08/28/2014  . Diabetes mellitus type 2, controlled ([place]) 08/06/2014  . Hyperlipidemia 08/06/2014  . Allergic rhinitis 07/22/2008  . 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 DYSPNEA 07/22/2008  . Diabetes mellitus type 2, uncontrolled ([place]) 07/21/2008  . DIABETIC PERIPHERAL NEUROPATHY 07/21/2008  .  Depression with anxiety 07/21/2008  . Essential hypertension 07/21/2008  . Mild persistent chronic asthma without complication 35/36/1443  . G E R D 07/21/2008  . PULMONARY EMBOLISM, HX OF 07/21/2008    Past Surgical History:  Procedure Laterality Date  . ABDOMINAL HYSTERECTOMY    . CARDIAC CATHETERIZATION  1994   minimal LAD dz, no other dz, EF normal  . CARDIAC CATHETERIZATION N/A 01/18/2016   Procedure: Left Heart Cath and Coronary Angiography;  Surgeon: Burnell Blanks, MD;  Location: [facility];  Service: Cardiovascular;  Laterality: N/A;  . CARDIAC CATHETERIZATION N/A 01/18/2016   Procedure: Coronary Stent Intervention;  Surgeon: Burnell Blanks, MD;  Location: [facility];  Service: Cardiovascular;  Laterality: N/A;  . COLONOSCOPY WITH PROPOFOL N/A 04/09/2014   Procedure: COLONOSCOPY WITH PROPOFOL;  Surgeon: Cleotis Nipper, MD;  Location: [facility];  Service: Endoscopy;  Laterality: N/A;  . CORONARY STENT PLACEMENT  01/19/2016   STENT SYNERGY DES 1.54M08 drug eluting stent was successfully placed, and overlaps the 2.5 x 38 mm Synergy stent placed distally.  . ESOPHAGOGASTRODUODENOSCOPY (EGD) WITH PROPOFOL N/A 04/09/2014   Procedure: ESOPHAGOGASTRODUODENOSCOPY (EGD) WITH PROPOFOL;  Surgeon: Cleotis Nipper, MD;  Location: [facility];  Service: Endoscopy;  Laterality: N/A;  . HEMORRHOID SURGERY    . HERNIA REPAIR    . KNEE ARTHROSCOPY       OB History   None      Home Medications    Prior to Admission medications   Medication Sig Start Date End Date Taking? Authorizing Provider  albuterol (PROAIR HFA) 108 (90 Base) MCG/ACT inhaler Inhale 2 puffs into the lungs every 4 (four) hours as needed for wheezing or shortness of breath. 06/29/17  Yes Hayden Rasmussen, MD  albuterol (PROVENTIL) (2.5 MG/3ML) 0.083% nebulizer solution Take 2.5 mg by nebulization every 4 (four) hours as needed for wheezing or shortness of breath (((PLAN C))).   Yes [provider]  ALPRAZolam (XANAX) 0.5 MG tablet Take 1 tablet (0.5 mg total) by mouth 2 (two) times daily as needed for anxiety. 07/31/15  Yes Theodis Blaze, MD  aspirin EC 81 MG tablet Take 81 mg by mouth daily.    Yes [provider]  atorvastatin (LIPITOR) 40 MG tablet Take 40 mg by mouth at bedtime.   Yes [provider]  Biotin 5000 MCG CAPS Take 1 capsule by mouth daily.   Yes [provider]  budesonide (PULMICORT) 0.5 MG/2ML nebulizer  solution Take 2 mLs (0.5 mg total) by nebulization 2 (two) times daily. 12/24/17  Yes Florencia Reasons, MD  dextromethorphan (DELSYM) 30 MG/5ML liquid 2 tsp every 12 hours as needed w/ flutter   Yes [provider]  diltiazem (CARDIZEM CD) 240 MG 24 hr capsule Take 1 capsule (240 mg total) by mouth daily. 09/17/17  Yes Burtis Junes, NP  divalproex (DEPAKOTE ER) 500 MG 24 hr tablet Take 2 tablets (1,000 mg total) by mouth at bedtime. 05/29/17  Yes Dennie Bible, NP  esomeprazole (NEXIUM) 40 MG capsule Take 1 capsule (40 mg total) by mouth 2 (two) times daily before a meal. 10/18/17  Yes Rai, Ripudeep K, MD  estradiol (ESTRACE) 1 MG tablet Take 1 mg by mouth daily.    Yes [provider]  fluticasone (FLONASE) 50 MCG/ACT nasal spray Place 2 sprays into both nostrils daily as needed for allergies or rhinitis.   Yes [provider]  formoterol (PERFOROMIST) 20 MCG/2ML nebulizer solution Take  2 mLs (20 mcg total) by nebulization 2 (two) times daily. J41.0 12/28/17  Yes Tanda Rockers, MD  gabapentin (NEURONTIN) 100 MG capsule Take 1 capsule (100 mg total) by mouth 4 (four) times daily. 03/11/18  Yes Tanda Rockers, MD  guaiFENesin (MUCINEX) 600 MG 12 hr tablet Take 600 mg by mouth every 12 (twelve) hours as needed.   Yes [provider]  hydrALAZINE (APRESOLINE) 25 MG tablet Take 1 tablet (25 mg total) by mouth 2 (two) times daily. 09/17/17  Yes Burtis Junes, NP  metFORMIN (GLUCOPHAGE) 500 MG tablet Take 500 mg by mouth 2 (two) times daily with a meal.   Yes [provider]  nebivolol (BYSTOLIC) 2.5 MG tablet Take 2.5 mg by mouth daily.   Yes [provider]  nitroGLYCERIN (NITROSTAT) 0.4 MG SL tablet Place 1 tablet (0.4 mg total) under the tongue every 5 (five) minutes as needed for chest pain. x3 as needed 09/17/17  Yes Gerhardt, Marlane Hatcher, NP  potassium chloride SA (K-DUR,KLOR-CON) 20 MEQ tablet Take 20 mEq by mouth daily.    Yes [provider]  ranitidine (ZANTAC) 150 MG tablet Take 150 mg by mouth at bedtime.    Yes [provider]  torsemide (DEMADEX) 20 MG tablet Take 20-60 mg by mouth daily. 1-3 tabs (20mg  -60mg ) by mouth once daily 10/25/16  Yes [provider]  traMADol (ULTRAM) 50 MG tablet Take 1 tablet (50 mg total) by mouth every 4 (four) hours as needed for up to 5 days (for cough or pain). 03/11/18 03/16/18 Yes Tanda Rockers, MD  predniSONE (DELTASONE) 10 MG tablet Take  4 each am x 2 days,   2 each am x 2 days,  1 each am x 2 days and stop 03/11/18   Tanda Rockers, MD    Family History Family History  Problem Relation Age of Onset  . Allergies Mother   . Asthma Mother   . CAD Father 69  . Diabetes Father   . Hypertension Father   . Congestive Heart Failure Sister        died in her 76s  . CAD Sister 68    Social History Social History   Tobacco Use  . Smoking status: Former Smoker    Packs/day: 1.00    Years: 30.00    Pack years: 30.00    Types: Cigarettes    Last attempt to quit: 01/09/2004    Years since quitting: 14.1  . Smokeless tobacco: Never Used  Substance Use Topics  . Alcohol use: No    Alcohol/week: 0.0 standard drinks  . Drug use: No     Allergies   Amlodipine besylate; Lisinopril; and Pantoprazole sodium   Review of Systems Review of Systems  Respiratory: Positive for shortness of breath.   All other systems reviewed and are negative.    Physical Exam Updated Vital Signs BP (!) 146/79   Pulse 89   Temp 97.7 F (36.5 C) (Axillary)   Resp 20   Wt 74.4 kg   LMP  (LMP Unknown)   SpO2 95%   BMI 30.99 kg/m   Physical Exam  Constitutional: She is oriented to person, place, and time.  Tachypneic, increased work on breathing   HENT:  Head: Normocephalic.  Mouth/Throat: Oropharynx is clear and moist.  Eyes: Pupils are equal, round, and reactive to light. EOM are normal.  Neck: Normal range of motion.  Cardiovascular: Normal rate.    Pulmonary/Chest:  Tachypneic, increased  work of breathing. Diminished bilaterally but no obvious wheezing or retractions. Not tripoding.   Abdominal: Soft. Bowel sounds are normal.  Musculoskeletal: Normal range of motion.       Right lower leg: Normal.       Left lower leg: Normal.  No calf swelling or tenderness   Neurological: She is alert and oriented to person, place, and time.  Skin: Skin is warm. Capillary refill takes less than 2 seconds.  Psychiatric: She has a normal mood and affect. Her behavior is normal.  Nursing note and vitals reviewed.    ED Treatments / Results  Labs (all labs ordered are listed, but only abnormal results are displayed) Labs Reviewed  CBC WITH DIFFERENTIAL/PLATELET - Abnormal; Notable for the following components:      Result Value   WBC 14.8 (*)    RBC 3.68 (*)    Hemoglobin 11.2 (*)    MCV 102.2 (*)    MCHC 29.8 (*)    Neutro Abs 8.7 (*)    Lymphs Abs 4.4 (*)    Monocytes Absolute 1.5 (*)    All other components within normal limits  COMPREHENSIVE METABOLIC PANEL - Abnormal; Notable for the following components:   Potassium 3.4 (*)    Glucose, Bld 126 (*)    Creatinine, Ser 1.24 (*)    Calcium 8.8 (*)    GFR calc non Af Amer 43 (*)    GFR calc Af Amer 50 (*)    All other components within normal limits  I-STAT VENOUS BLOOD GAS, ED - Abnormal; Notable for the following components:   Bicarbonate 28.6 (*)    All other components within normal limits  BRAIN NATRIURETIC PEPTIDE  BLOOD GAS, VENOUS  I-STAT TROPONIN, ED    EKG EKG Interpretation  Date/Time:  Friday March 15 2018 10:38:52 EST Ventricular Rate:  90 PR Interval:    QRS Duration: 90 QT Interval:  369 QTC Calculation: 452 R Axis:   -11 Text Interpretation:  Sinus rhythm Borderline T abnormalities, diffuse leads Confirmed by [facility], : ([id number]), editor [place], Deirdre [phone]) on 03/15/2018 10:42:29 AM   Radiology Dg Chest Port 1 View  Result Date:  03/15/2018 CLINICAL DATA:  Two day history of shortness of breath and cough. Chest tightness today. History of chronic asthma, CHF, hypertension, diabetes, former smoker. EXAM: PORTABLE CHEST 1 VIEW COMPARISON:  Portable chest x-ray of December 19, 2017 FINDINGS: The lungs are adequately inflated. The lung markings are coarse at the left lung base but this is stable. The cardiac silhouette is top-normal in size. The pulmonary vascularity is normal. The trachea is midline. The bony thorax exhibits no acute abnormality. IMPRESSION: Mild chronic bronchitic reactive airway changes with scarring at the left lung base. No alveolar pneumonia nor CHF. Electronically Signed   By:   [place] M.D.   On: 03/15/2018 10:57    Procedures Procedures (including critical care time)  Medications Ordered in ED Medications  albuterol (PROVENTIL) (2.5 MG/3ML) 0.083% nebulizer solution 5 mg (has no administration in time range)  albuterol (PROVENTIL) (2.5 MG/3ML) 0.083% nebulizer solution 5 mg (5 mg Nebulization Given 03/15/18 1130)     Initial Impression / Assessment and Plan / ED Course  I have reviewed the triage vital signs and the nursing notes.  Pertinent labs & imaging results that were available during my care of the patient were reviewed by me and considered in my medical decision making (see chart for details).    [name] is a 67  y.o. female here with SOB, cough. Likely asthma exacerbation. Will get labs, VBG, CXR. Given solumedrol and 15 mg albuterol and 1 mg of atrovent and magnesium already. Will observe and reassess.    1:07 PM VBG reassuring. CXR clear, labs unremarkable. Felt better initially then started wheezing again. Tried ambulating patient and patient's pulse Ox is normal but she became more wheezy and short of breath. Will admit for asthma exacerbation. Given 20 mg of albuterol and atrovent already and will order another 5 mg of albuterol.    Final Clinical Impressions(s) / ED  Diagnoses   Final diagnoses:  None    ED Discharge Orders    None       Drenda Freeze, MD 03/15/18 1309

## 2018-03-16 DIAGNOSIS — F419 Anxiety disorder, unspecified: Secondary | ICD-10-CM | POA: Diagnosis not present

## 2018-03-16 DIAGNOSIS — J383 Other diseases of vocal cords: Secondary | ICD-10-CM | POA: Diagnosis not present

## 2018-03-16 DIAGNOSIS — R0602 Shortness of breath: Secondary | ICD-10-CM | POA: Diagnosis not present

## 2018-03-16 DIAGNOSIS — J45901 Unspecified asthma with (acute) exacerbation: Secondary | ICD-10-CM | POA: Diagnosis not present

## 2018-03-16 DIAGNOSIS — J209 Acute bronchitis, unspecified: Secondary | ICD-10-CM | POA: Diagnosis not present

## 2018-03-16 LAB — GLUCOSE, CAPILLARY
Glucose-Capillary: 164 mg/dL — ABNORMAL HIGH (ref 70–99)
Glucose-Capillary: 158 mg/dL — ABNORMAL HIGH (ref 70–99)
Glucose-Capillary: 234 mg/dL — ABNORMAL HIGH (ref 70–99)
Glucose-Capillary: 213 mg/dL — ABNORMAL HIGH (ref 70–99)

## 2018-03-16 LAB — CBC
HCT: 33.4 % — ABNORMAL LOW (ref 36.0–46.0)
Hemoglobin: 10.3 g/dL — ABNORMAL LOW (ref 12.0–15.0)
MCH: 30.3 pg (ref 26.0–34.0)
MCHC: 30.8 g/dL (ref 30.0–36.0)
MCV: 98.2 fL (ref 80.0–100.0)
Platelets: 299 10*3/uL (ref 150–400)
RBC: 3.4 MIL/uL — ABNORMAL LOW (ref 3.87–5.11)
RDW: 13.8 % (ref 11.5–15.5)
WBC: 12.5 10*3/uL — ABNORMAL HIGH (ref 4.0–10.5)
nRBC: 0 % (ref 0.0–0.2)

## 2018-03-16 LAB — BRAIN NATRIURETIC PEPTIDE: B NATRIURETIC PEPTIDE 5: 129.2 pg/mL — AB (ref 0.0–100.0)

## 2018-03-16 LAB — BASIC METABOLIC PANEL
Anion gap: 12 (ref 5–15)
BUN: 25 mg/dL — ABNORMAL HIGH (ref 8–23)
CO2: 24 mmol/L (ref 22–32)
Calcium: 8.9 mg/dL (ref 8.9–10.3)
Chloride: 99 mmol/L (ref 98–111)
Creatinine, Ser: 1.11 mg/dL — ABNORMAL HIGH (ref 0.44–1.00)
GFR calc Af Amer: 57 mL/min — ABNORMAL LOW (ref >60)
GFR calc non Af Amer: 49 mL/min — ABNORMAL LOW (ref >60)
Glucose, Bld: 190 mg/dL — ABNORMAL HIGH (ref 70–99)
Potassium: 4 mmol/L (ref 3.5–5.1)
Sodium: 135 mmol/L (ref 135–145)

## 2018-03-16 LAB — MRSA PCR SCREENING: MRSA by PCR: NEGATIVE

## 2018-03-16 LAB — HIV ANTIBODY (ROUTINE TESTING W REFLEX): HIV Screen 4th Generation wRfx: NONREACTIVE

## 2018-03-16 MED ORDER — POLYETHYLENE GLYCOL 3350 17 G PO PACK
17.0000 g | PACK | Freq: Every day | ORAL | Status: DC | PRN
  Administered 2018-03-16 – 2018-03-17 (×2): 17 g via ORAL
  Filled 2018-03-16 (×2): qty 1

## 2018-03-16 MED ORDER — LORATADINE 10 MG PO TABS
10.0000 mg | ORAL_TABLET | Freq: Every day | ORAL | Status: DC
  Administered 2018-03-16 – 2018-03-18 (×3): 10 mg via ORAL
  Filled 2018-03-16 (×3): qty 1

## 2018-03-16 MED ORDER — EPINEPHRINE 0.3 MG/0.3ML IJ SOAJ: 0.3000 mg | Freq: Once | INTRAMUSCULAR | Status: DC

## 2018-03-16 MED ORDER — IPRATROPIUM-ALBUTEROL 0.5-2.5 (3) MG/3ML IN SOLN
3.0000 mL | Freq: Three times a day (TID) | RESPIRATORY_TRACT | Status: DC
  Administered 2018-03-17 – 2018-03-18 (×3): 3 mL via RESPIRATORY_TRACT
  Filled 2018-03-16 (×5): qty 3

## 2018-03-16 MED ORDER — FAMOTIDINE IN NACL 20-0.9 MG/50ML-% IV SOLN
20.0000 mg | Freq: Two times a day (BID) | INTRAVENOUS | Status: AC
  Administered 2018-03-16 – 2018-03-17 (×4): 20 mg via INTRAVENOUS
  Filled 2018-03-16 (×4): qty 50

## 2018-03-16 MED ORDER — PREDNISONE 20 MG PO TABS
20.0000 mg | ORAL_TABLET | Freq: Every day | ORAL | Status: DC
  Administered 2018-03-17 – 2018-03-18 (×2): 20 mg via ORAL
  Filled 2018-03-16 (×2): qty 1

## 2018-03-16 MED ORDER — GUAIFENESIN ER 600 MG PO TB12
1200.0000 mg | ORAL_TABLET | Freq: Two times a day (BID) | ORAL | Status: DC | PRN
  Administered 2018-03-18: 1200 mg via ORAL
  Filled 2018-03-16: qty 2

## 2018-03-16 MED ORDER — IPRATROPIUM-ALBUTEROL 0.5-2.5 (3) MG/3ML IN SOLN
3.0000 mL | Freq: Four times a day (QID) | RESPIRATORY_TRACT | Status: DC
  Administered 2018-03-16 (×2): 3 mL via RESPIRATORY_TRACT
  Filled 2018-03-16 (×2): qty 3

## 2018-03-16 MED ORDER — ALPRAZOLAM 0.5 MG PO TABS
0.5000 mg | ORAL_TABLET | Freq: Three times a day (TID) | ORAL | Status: DC | PRN
  Administered 2018-03-16 – 2018-03-18 (×4): 0.5 mg via ORAL
  Filled 2018-03-16 (×4): qty 1

## 2018-03-16 MED ORDER — METHYLPREDNISOLONE SODIUM SUCC 40 MG IJ SOLR
40.0000 mg | Freq: Four times a day (QID) | INTRAMUSCULAR | Status: DC
  Administered 2018-03-16: 40 mg via INTRAVENOUS
  Filled 2018-03-16: qty 1

## 2018-03-16 MED ORDER — EPINEPHRINE PF 1 MG/10ML IJ SOSY
0.3000 mg | PREFILLED_SYRINGE | Freq: Once | INTRAMUSCULAR | Status: AC
  Administered 2018-03-16: 0.3 mg via INTRAMUSCULAR
  Filled 2018-03-16: qty 10

## 2018-03-16 MED ORDER — SALINE SPRAY 0.65 % NA SOLN
1.0000 | NASAL | Status: DC | PRN
  Filled 2018-03-16: qty 44

## 2018-03-16 MED ORDER — HYDROCOD POLST-CPM POLST ER 10-8 MG/5ML PO SUER
5.0000 mL | Freq: Two times a day (BID) | ORAL | Status: DC | PRN
  Administered 2018-03-17: 5 mL via ORAL
  Filled 2018-03-16: qty 5

## 2018-03-16 NOTE — Progress Notes (Signed)
NAME:  [name], MRN:  [id number], DOB:  [birth year], LOS: 0 ADMISSION DATE:  03/15/2018, CONSULTATION DATE: March 16, 2018 REFERRING MD: Dr. Reesa Chew, CHIEF COMPLAINT: Dyspnea  Brief History   68 year old female followed by Dr. Melvyn Novas with asthma and vocal cord dysfunction admitted for exacerbation of the same.  History of present illness   67 year old female with a past medical history significant for upper airway cough syndrome came to the emergency department on March 15, 2018 complaining of shortness of breath.  She was seen by our office on November 11 and was treated with gabapentin, tramadol and prednisone.  She took this medicine but apparently her symptoms of shortness of breath chest tightness and chest pain and cough worsened so she went to the emergency department per our offices recommendations on March 15, 2018.  Overnight she was treated with IV Solu-Medrol, high doses of albuterol and Atrovent as well as formoterol.  On rounds in the morning she was noted to have increasing shortness of breath so BiPAP was started and pulmonary medicine was consulted.  Upon my arrival she was breathing comfortably and speaking to me in full sentences while wearing BiPAP.  She stated that some children in her home have been sick with allergies and one had a virus but she did not think that she contracted the virus.  Specifically she says she does not recall having any sort of sinus congestion or postnasal drip.  She said that she had been experiencing some acid reflux lately but that has been fairly well controlled with her antacid.  She said that most of her symptoms were chest tightness and shortness of breath.  She denied any recent leg swelling.  Past Medical History  Asthma, vocal cord dysfunction, depression, anxiety, diastolic heart failure, diabetes, coronary artery disease  Significant Hospital Events   November 15 admission  Consults:  November 16 pulmonary and critical care  medicine  Procedures:    Significant Diagnostic Tests:    Micro Data:    Antimicrobials:     Interim history/subjective:  As above  Objective   Blood pressure (!) 165/97, pulse 95, temperature 97.8 F (36.6 C), temperature source Oral, resp. rate (!) 22, weight 74.4 kg, SpO2 97 %.    FiO2 (%):  [21 %] 21 %  No intake or output data in the 24 hours ending 03/16/18 1002 Filed Weights   03/15/18 1037  Weight: 74.4 kg    Examination:  General:  Resting comfortably in bed on BIPAP HENT: NCAT BIPAP mask in place PULM: No wheezing in lungs, only upper airway high pitched exp wheeze noted, goes away when she talk; able to speak in complete sentences, no accessory muscle use CV: RRR, no mgr GI: BS+, soft, nontender MSK: normal bulk and tone Neuro: awake, alert, no distress, MAEW   Resolved Hospital Problem list     Assessment & Plan:  Acute exacerbation of vocal cord dysfunction: Physical exam does not support bronchoconstriction or asthma exacerbation.  Her outpatient work-up up until this point has revealed 0 evidence of asthma and has always pointed towards vocal cord dysfunction.  Chest x-ray and physical exam is unremarkable. > Continue BiPAP for now but I think it is mostly just relieving work of breathing and settling anxiety > BiPAP can be as needed > Would use low doses of prednisone because I think they are unlikely to be helpful, will lower dose to 20 mg daily x3 days > I am not sure that bronchodilators are going  to help much other than just make her more anxious, will stop brovana > Continue DuoNeb every 6 hours > Continue gabapentin 100 mg 4 times a day > Continue gastroesophageal reflux disease management > Continue Claritin for now though would prefer chlorpheniramine if available on formulary > Add chlorpheniramine-hydrocodone cough syrup as needed  As anxiety can make vocal cord dysfunction worse would not give epinephrine again unless there is clear  signs of angioedema and epinephrine can make anxiety worse > Will increase frequency of Xanax    Labs   CBC: Recent Labs  Lab 03/15/18 1042 03/16/18 0541  WBC 14.8* 12.5*  NEUTROABS 8.7*  --   HGB 11.2* 10.3*  HCT 37.6 33.4*  MCV 102.2* 98.2  PLT 318 782    Basic Metabolic Panel: Recent Labs  Lab 03/15/18 1042 03/16/18 0541  NA 139 135  K 3.4* 4.0  CL 102 99  CO2 24 24  GLUCOSE 126* 190*  BUN 20 25*  CREATININE 1.24* 1.11*  CALCIUM 8.8* 8.9   GFR: Estimated Creatinine Clearance: 44.1 mL/min (A) (by C-G formula based on SCr of 1.11 mg/dL (H)). Recent Labs  Lab 03/15/18 1042 03/16/18 0541  WBC 14.8* 12.5*    Liver Function Tests: Recent Labs  Lab 03/15/18 1042  AST 18  ALT 15  ALKPHOS 48  BILITOT 0.6  PROT 7.1  ALBUMIN 3.6   No results for input(s): LIPASE, AMYLASE in the last 168 hours. No results for input(s): AMMONIA in the last 168 hours.  ABG    Component Value Date/Time   PHART 7.382 08/12/2017 0550   PCO2ART 45.0 08/12/2017 0550   PO2ART 163.0 (H) 08/12/2017 0550   HCO3 28.6 (H) 03/15/2018 1100   TCO2 30 03/15/2018 1100   ACIDBASEDEF 1.0 12/19/2017 1820   O2SAT 71.0 03/15/2018 1100     Coagulation Profile: No results for input(s): INR, PROTIME in the last 168 hours.  Cardiac Enzymes: No results for input(s): CKTOTAL, CKMB, CKMBINDEX, TROPONINI in the last 168 hours.  HbA1C: Hgb A1c MFr Bld  Date/Time Value Ref Range Status  10/06/2017 02:53 AM 7.3 (H) 4.8 - 5.6 % Final    Comment:    (NOTE) Pre diabetes:          5.7%-6.4% Diabetes:              >6.4% Glycemic control for   <7.0% adults with diabetes   07/18/2016 03:52 AM 6.6 (H) 4.8 - 5.6 % Final    Comment:    (NOTE)         Pre-diabetes: 5.7 - 6.4         Diabetes: >6.4         Glycemic control for adults with diabetes: <7.0     CBG: Recent Labs  Lab 03/15/18 1618 03/15/18 1954 03/15/18 2108 03/16/18 0708  GLUCAP 252* 264* 232* 164*    Review of Systems:     Gen: Denies fever, chills, weight change, fatigue, night sweats HEENT: Denies blurred vision, double vision, hearing loss, tinnitus, sinus congestion, rhinorrhea, sore throat, neck stiffness, dysphagia PULM: per HPI CV: Denies chest pain, edema, orthopnea, paroxysmal nocturnal dyspnea, palpitations GI: Denies abdominal pain, nausea, vomiting, diarrhea, hematochezia, melena, constipation, change in bowel habits GU: Denies dysuria, hematuria, polyuria, oliguria, urethral discharge Endocrine: Denies hot or cold intolerance, polyuria, polyphagia or appetite change Derm: Denies rash, dry skin, scaling or peeling skin change Heme: Denies easy bruising, bleeding, bleeding gums Neuro: Denies headache, numbness, weakness, slurred speech, loss of memory  or consciousness   Past Medical History  She,  has a past medical history of Anxiety, Asthma, Bronchitis, Chronic diastolic CHF (congestive heart failure) ([place]), Chronic diastolic CHF (congestive heart failure) ([place]) (07/17/2016), Coronary artery disease, Depression, Diabetes mellitus, Dyslipidemia, GERD (gastroesophageal reflux disease), Heart attack ([place]), Hypertension, Hypertensive heart disease, NSTEMI (non-ST elevated myocardial infarction) ([place]) (12/2015), and Pneumonia.   Surgical History    Past Surgical History:  Procedure Laterality Date  . ABDOMINAL HYSTERECTOMY    . CARDIAC CATHETERIZATION  1994   minimal LAD dz, no other dz, EF normal  . CARDIAC CATHETERIZATION N/A 01/18/2016   Procedure: Left Heart Cath and Coronary Angiography;  Surgeon: Burnell Blanks, MD;  Location: [facility];  Service: Cardiovascular;  Laterality: N/A;  . CARDIAC CATHETERIZATION N/A 01/18/2016   Procedure: Coronary Stent Intervention;  Surgeon: Burnell Blanks, MD;  Location: [facility];  Service: Cardiovascular;  Laterality: N/A;  . COLONOSCOPY WITH PROPOFOL N/A 04/09/2014   Procedure: COLONOSCOPY WITH PROPOFOL;  Surgeon: Cleotis Nipper, MD;  Location: [facility];  Service: Endoscopy;  Laterality: N/A;  . CORONARY STENT PLACEMENT  01/19/2016   STENT SYNERGY DES 2.87O67 drug eluting stent was successfully placed, and overlaps the 2.5 x 38 mm Synergy stent placed distally.  . ESOPHAGOGASTRODUODENOSCOPY (EGD) WITH PROPOFOL N/A 04/09/2014   Procedure: ESOPHAGOGASTRODUODENOSCOPY (EGD) WITH PROPOFOL;  Surgeon: Cleotis Nipper, MD;  Location: [facility];  Service: Endoscopy;  Laterality: N/A;  . HEMORRHOID SURGERY    . HERNIA REPAIR    . KNEE ARTHROSCOPY       Social History   reports that she quit smoking about 14 years ago. Her smoking use included cigarettes. She has a 30.00 pack-year smoking history. She has never used smokeless tobacco. She reports that she does not drink alcohol or use drugs.   Family History   Her family history includes Allergies in her mother; Asthma in her mother; CAD (age of onset: 29) in her father; CAD (age of onset: 48) in her sister; Congestive Heart Failure in her sister; Diabetes in her father; Hypertension in her father.   Allergies Allergies  Allergen Reactions  . Amlodipine Besylate Other (See Comments)    Reaction unknown  . Lisinopril Other (See Comments)    Reaction unknown  . Pantoprazole Sodium Other (See Comments)    Reaction unknown     Home Medications  Prior to Admission medications   Medication Sig Start Date End Date Taking? Authorizing Provider  albuterol (PROAIR HFA) 108 (90 Base) MCG/ACT inhaler Inhale 2 puffs into the lungs every 4 (four) hours as needed for wheezing or shortness of breath. 06/29/17  Yes Hayden Rasmussen, MD  albuterol (PROVENTIL) (2.5 MG/3ML) 0.083% nebulizer solution Take 2.5 mg by nebulization every 4 (four) hours as needed for wheezing or shortness of breath (((PLAN C))).   Yes [provider]  ALPRAZolam (XANAX) 0.5 MG tablet Take 1 tablet (0.5 mg total) by mouth 2 (two) times daily as needed for anxiety. 07/31/15  Yes Theodis Blaze, MD  aspirin EC 81 MG tablet Take 81 mg by mouth daily.    Yes [provider]  atorvastatin (LIPITOR) 40 MG tablet Take 40 mg by mouth at bedtime.   Yes [provider]  Biotin 5000 MCG CAPS Take 1 capsule by mouth daily.   Yes [provider]  budesonide (PULMICORT) 0.5 MG/2ML nebulizer solution Take 2 mLs (0.5 mg total) by nebulization 2 (two) times daily. 12/24/17  Yes Florencia Reasons, MD  dextromethorphan (DELSYM) 30 MG/5ML liquid 2 tsp every 12 hours as needed w/ flutter   Yes [provider]  diltiazem (CARDIZEM CD) 240 MG 24 hr capsule Take 1 capsule (240 mg total) by mouth daily. 09/17/17  Yes Burtis Junes, NP  divalproex (DEPAKOTE ER) 500 MG 24 hr tablet Take 2 tablets (1,000 mg total) by mouth at bedtime. 05/29/17  Yes Dennie Bible, NP  esomeprazole (NEXIUM) 40 MG capsule Take 1 capsule (40 mg total) by mouth 2 (two) times daily before a meal. 10/18/17  Yes Rai, Ripudeep K, MD  estradiol (ESTRACE) 1 MG tablet Take 1 mg by mouth daily.    Yes [provider]  fluticasone (FLONASE) 50 MCG/ACT nasal spray Place 2 sprays into both nostrils daily as needed for allergies or rhinitis.   Yes [provider]  formoterol (PERFOROMIST) 20 MCG/2ML nebulizer solution Take 2 mLs (20 mcg total) by nebulization 2 (two) times daily. J41.0 12/28/17  Yes Tanda Rockers, MD  gabapentin (NEURONTIN) 100 MG capsule Take 1 capsule (100 mg total) by mouth 4 (four) times daily. 03/11/18  Yes Tanda Rockers, MD  guaiFENesin (MUCINEX) 600 MG 12 hr tablet Take 600 mg by mouth every 12 (twelve) hours as needed.   Yes [provider]  hydrALAZINE (APRESOLINE) 25 MG tablet Take 1 tablet (25 mg total) by mouth 2 (two) times daily. 09/17/17  Yes Burtis Junes, NP  metFORMIN (GLUCOPHAGE) 500 MG tablet Take 500 mg by mouth 2 (two) times daily with a meal.   Yes [provider]  nebivolol (BYSTOLIC) 2.5 MG tablet Take 2.5 mg by mouth daily.    Yes [provider]  nitroGLYCERIN (NITROSTAT) 0.4 MG SL tablet Place 1 tablet (0.4 mg total) under the tongue every 5 (five) minutes as needed for chest pain. x3 as needed 09/17/17  Yes Gerhardt, Marlane Hatcher, NP  potassium chloride SA (K-DUR,KLOR-CON) 20 MEQ tablet Take 20 mEq by mouth daily.    Yes [provider]  ranitidine (ZANTAC) 150 MG tablet Take 150 mg by mouth at bedtime.    Yes [provider]  torsemide (DEMADEX) 20 MG tablet Take 20-60 mg by mouth daily. 1-3 tabs (20mg  -60mg ) by mouth once daily 10/25/16  Yes [provider]  traMADol (ULTRAM) 50 MG tablet Take 1 tablet (50 mg total) by mouth every 4 (four) hours as needed for up to 5 days (for cough or pain). 03/11/18 03/16/18 Yes Tanda Rockers, MD  predniSONE (DELTASONE) 10 MG tablet Take  4 each am x 2 days,   2 each am x 2 days,  1 each am x 2 days and stop 03/11/18   Tanda Rockers, MD     Critical care time: none    Roselie Awkward, MD [facility] Pager: [phone] Cell: [phone] If no response, call [phone]

## 2018-03-16 NOTE — Progress Notes (Signed)
Patient was taken off BIPAP to give patient a break and so RN could given medications. Patient was not labored and able to speak full sentences. Listened and BBS were clear; diminished. Upper airway wheezes are noted (in throat). Patient seems comfortable, no distressed and brushing her teeth with RN student at bedside. RT told patient to call if she was SOB or had increased WOB. RT will continue to monitor pt.

## 2018-03-16 NOTE — Progress Notes (Signed)
PROGRESS NOTE    [name]  PIR:[id number] DOB: 1950/07/01 DOA: 03/15/2018 PCP: [name], MD   Brief Narrative:  67 year old female with history of chronic diastolic CHF, CAD, diabetes mellitus type 2, long-standing history of asthma, vocal cord paralysis came to the hospital with shortness of breath.  Initially she was diagnosed with asthma exacerbation and was admitted to the hospital.  She was started on IV steroids and routine bronchodilator treatment.  The following morning she appeared more shortness of breath with significant wheezing in her upper airway therefore pulmonary team was consulted who dysfunction   Assessment & Plan:   Principal Problem:   Acute bronchitis with asthma with acute exacerbation Active Problems:   Depression with anxiety   Essential hypertension   Asthma exacerbation   CAD in native artery   Seizures (HCC)   Chronic diastolic CHF (congestive heart failure) (HCC)   DM2 (diabetes mellitus, type 2) (HCC)   CKD (chronic kidney disease), stage III (HCC)   Acute shortness of breath secondary to vocal cord dysfunction Acute respiratory failure requiring BiPAP Acute asthma exacerbation - Appreciate pulmonary input, per their suggestion it appears patient has more vocal cord dysfunction versus asthma exacerbation.  Solu-Medrol dose has been reduced.  Continue bronchodilators as prescribed. - Continue BiPAP at this point due to significant wheezing from her upper airway.  Can be weaned off later today.  Anxiety -Continue Xanax  Chronic diastolic congestive heart failure, appears euvolemic -Resume home medications.  Essential hypertension -Continue hydralazine, Bystolic and torsemide  Non-insulin-dependent diabetes mellitus type 2 -Metformin on hold.  Insulin sliding scale and Accu-Chek  Diabetic peripheral neuropathy -Continue home meds including gabapentin  Coronary artery disease - Resume home aspirin, statin and other cardiac  medications  History of seizure -On Depakote   DVT prophylaxis: Lovenox Code Status: Full code Family Communication: None at bedside Disposition Plan: Patient will require at least another 24 to 48 hours of hospital stay.  Currently on BiPAP.  Transferring to stepdown.  Consultants:   Pulmonary  Procedures:   None  Antimicrobials:   None   Subjective: Patient appeared to have significant respiratory wheezing especially from her upper airway.  She denied any allergies.  States the sats continue to worsen overnight.  Review of Systems Otherwise negative except as per HPI, including: General: Denies fever, chills, night sweats or unintended weight loss. Resp: Denies cough Cardiac: Denies chest pain, palpitations, orthopnea, paroxysmal nocturnal dyspnea. GI: Denies abdominal pain, nausea, vomiting, diarrhea or constipation GU: Denies dysuria, frequency, hesitancy or incontinence MS: Denies muscle aches, joint pain or swelling Neuro: Denies headache, neurologic deficits (focal weakness, numbness, tingling), abnormal gait Psych: Denies anxiety, depression, SI/HI/AVH Skin: Denies new rashes or lesions ID: Denies sick contacts, exotic exposures, travel  Objective: Vitals:   03/16/18 0538 03/16/18 0755 03/16/18 0946 03/16/18 1220  BP: (!) 164/81 (!) 165/97    Pulse: 95 95  82  Resp: 16 (!) 22  18  Temp: 97.8 F (36.6 C)     TempSrc: Oral     SpO2: 98% 100% 97%   Weight:        Intake/Output Summary (Last 24 hours) at 03/16/2018 1335 Last data filed at 03/16/2018 1200 Gross per 24 hour  Intake 100 ml  Output -  Net 100 ml   Filed Weights   03/15/18 1037  Weight: 74.4 kg    Examination:  General exam: Appears calm and comfortable  Respiratory system: Significant upper airway wheezing especially in her neck area.  Diminished breath sounds in her lung fields.  Clear use of accessory muscles in her neck and abdomen. Cardiovascular system: S1 & S2 heard, RRR. No  JVD, murmurs, rubs, gallops or clicks. No pedal edema. Gastrointestinal system: Abdomen is nondistended, soft and nontender. No organomegaly or masses felt. Normal bowel sounds heard. Central nervous system: Alert and oriented. No focal neurological deficits. Extremities: Symmetric 5 x 5 power. Skin: No rashes, lesions or ulcers Psychiatry: Judgement and insight appear normal. Mood & affect appropriate.     Data Reviewed:   CBC: Recent Labs  Lab 03/15/18 1042 03/16/18 0541  WBC 14.8* 12.5*  NEUTROABS 8.7*  --   HGB 11.2* 10.3*  HCT 37.6 33.4*  MCV 102.2* 98.2  PLT 318 324   Basic Metabolic Panel: Recent Labs  Lab 03/15/18 1042 03/16/18 0541  NA 139 135  K 3.4* 4.0  CL 102 99  CO2 24 24  GLUCOSE 126* 190*  BUN 20 25*  CREATININE 1.24* 1.11*  CALCIUM 8.8* 8.9   GFR: Estimated Creatinine Clearance: 44.1 mL/min (A) (by C-G formula based on SCr of 1.11 mg/dL (H)). Liver Function Tests: Recent Labs  Lab 03/15/18 1042  AST 18  ALT 15  ALKPHOS 48  BILITOT 0.6  PROT 7.1  ALBUMIN 3.6   No results for input(s): LIPASE, AMYLASE in the last 168 hours. No results for input(s): AMMONIA in the last 168 hours. Coagulation Profile: No results for input(s): INR, PROTIME in the last 168 hours. Cardiac Enzymes: No results for input(s): CKTOTAL, CKMB, CKMBINDEX, TROPONINI in the last 168 hours. BNP (last 3 results) No results for input(s): PROBNP in the last 8760 hours. HbA1C: No results for input(s): HGBA1C in the last 72 hours. CBG: Recent Labs  Lab 03/15/18 1618 03/15/18 1954 03/15/18 2108 03/16/18 0708 03/16/18 1151  GLUCAP 252* 264* 232* 164* 158*   Lipid Profile: No results for input(s): CHOL, HDL, LDLCALC, TRIG, CHOLHDL, LDLDIRECT in the last 72 hours. Thyroid Function Tests: No results for input(s): TSH, T4TOTAL, FREET4, T3FREE, THYROIDAB in the last 72 hours. Anemia Panel: No results for input(s): VITAMINB12, FOLATE, FERRITIN, TIBC, IRON, RETICCTPCT in  the last 72 hours. Sepsis Labs: No results for input(s): PROCALCITON, LATICACIDVEN in the last 168 hours.  Recent Results (from the past 240 hour(s))  MRSA PCR Screening     Status: None   Collection Time: 03/16/18 11:47 AM  Result Value Ref Range Status   MRSA by PCR NEGATIVE NEGATIVE Final    Comment:        The GeneXpert MRSA Assay (FDA approved for NASAL specimens only), is one component of a comprehensive MRSA colonization surveillance program. It is not intended to diagnose MRSA infection nor to guide or monitor treatment for MRSA infections. Performed at [facility], [place] [address] [place],  [address]          Radiology Studies: Dg Chest [facility] 1 View  Result Date: 03/15/2018 CLINICAL DATA:  Two day history of shortness of breath and cough. Chest tightness today. History of chronic asthma, CHF, hypertension, diabetes, former smoker. EXAM: PORTABLE CHEST 1 VIEW COMPARISON:  Portable chest x-ray of December 19, 2017 FINDINGS: The lungs are adequately inflated. The lung markings are coarse at the left lung base but this is stable. The cardiac silhouette is top-normal in size. The pulmonary vascularity is normal. The trachea is midline. The bony thorax exhibits no acute abnormality. IMPRESSION: Mild chronic bronchitic reactive airway changes with scarring at the left lung base. No  alveolar pneumonia nor CHF. Electronically Signed   By: David  Martinique M.D.   On: 03/15/2018 10:57        Scheduled Meds: . aspirin EC  81 mg Oral Daily  . atorvastatin  40 mg Oral QHS  . diltiazem  240 mg Oral Daily  . divalproex  1,000 mg Oral QHS  . enoxaparin (LOVENOX) injection  40 mg Subcutaneous Q24H  . estradiol  1 mg Oral Daily  . gabapentin  100 mg Oral QID  . hydrALAZINE  25 mg Oral BID  . insulin aspart  0-15 Units Subcutaneous TID WC  . insulin aspart  0-5 Units Subcutaneous QHS  . ipratropium-albuterol  3 mL Nebulization Q6H  . loratadine  10 mg Oral Daily  .  nebivolol  2.5 mg Oral Daily  . pantoprazole  80 mg Oral Q1200  . potassium chloride SA  20 mEq Oral Daily  . [START ON 03/17/2018] predniSONE  20 mg Oral Q breakfast  . sodium chloride flush  3 mL Intravenous Q12H  . torsemide  20 mg Oral Daily   Continuous Infusions: . famotidine (PEPCID) IV Stopped (03/16/18 1145)     LOS: 0 days   Time spent= 40 mins    Kye Hedden Arsenio Loader, MD [facility] Pager [phone]   If 7PM-7AM, please contact night-coverage [email or web address] Password [email or web address] 03/16/2018, 1:35 PM

## 2018-03-16 NOTE — Progress Notes (Signed)
This am, patient working very hard to breath, intracostal muscles.  Loud wheezing.  Paged respiratory, paged Dr. Reesa Chew.  He is going to change her nebulizer treatment.  Currently sat at 100%.  Before treatment, sat was 100%.

## 2018-03-17 DIAGNOSIS — J45901 Unspecified asthma with (acute) exacerbation: Secondary | ICD-10-CM | POA: Diagnosis not present

## 2018-03-17 DIAGNOSIS — J209 Acute bronchitis, unspecified: Secondary | ICD-10-CM | POA: Diagnosis not present

## 2018-03-17 LAB — BASIC METABOLIC PANEL
ANION GAP: 12 (ref 5–15)
BUN: 33 mg/dL — ABNORMAL HIGH (ref 8–23)
CALCIUM: 9.4 mg/dL (ref 8.9–10.3)
CHLORIDE: 99 mmol/L (ref 98–111)
CO2: 25 mmol/L (ref 22–32)
Creatinine, Ser: 1.47 mg/dL — ABNORMAL HIGH (ref 0.44–1.00)
GFR calc non Af Amer: 35 mL/min — ABNORMAL LOW (ref >60)
GFR, EST AFRICAN AMERICAN: 41 mL/min — AB (ref >60)
Glucose, Bld: 166 mg/dL — ABNORMAL HIGH (ref 70–99)
Potassium: 4.4 mmol/L (ref 3.5–5.1)
Sodium: 136 mmol/L (ref 135–145)

## 2018-03-17 LAB — GLUCOSE, CAPILLARY
Glucose-Capillary: 129 mg/dL — ABNORMAL HIGH (ref 70–99)
Glucose-Capillary: 113 mg/dL — ABNORMAL HIGH (ref 70–99)
Glucose-Capillary: 214 mg/dL — ABNORMAL HIGH (ref 70–99)
Glucose-Capillary: 226 mg/dL — ABNORMAL HIGH (ref 70–99)

## 2018-03-17 LAB — MAGNESIUM: Magnesium: 2.3 mg/dL (ref 1.7–2.4)

## 2018-03-17 NOTE — Progress Notes (Signed)
Patient on RA. BIPAP on stand by at bedside. No distress noted.

## 2018-03-17 NOTE — Progress Notes (Signed)
PROGRESS NOTE    [name]  NWG:[id number] DOB: 04/09/1951 DOA: 03/15/2018 PCP: [name], MD   Brief Narrative:  67 year old female with history of chronic diastolic CHF, CAD, diabetes mellitus type 2, long-standing history of asthma, vocal cord paralysis came to the hospital with shortness of breath.  Initially she was diagnosed with asthma exacerbation and was admitted to the hospital.  She was started on IV steroids and routine bronchodilator treatment.  The following morning she appeared more shortness of breath with significant wheezing in her upper airway therefore pulmonary team was consulted who dysfunction. -She was transferred from team 12 to me today -Overall feels a little better continues to have wheezing   Assessment & Plan:  Acute respiratory failure -Secondary to vocal cord dysfunction, anxiety -Per pulmonary PFTs not consistent with asthma, she is followed by Dr. Clois Comber -Quickly taper off prednisone, unable to help much -Brovana discontinued -Off BiPAP since yesterday -Continue Xanax -Outpatient ENT follow-up for vocal cord dysfunction  Anxiety -Continue Xanax  Chronic diastolic congestive heart failure, appears euvolemic -Restarted torsemide 20 mg daily  Essential hypertension -Continue hydralazine, Bystolic and torsemide  Non-insulin-dependent diabetes mellitus type 2 -Metformin on hold.  Insulin sliding scale and Accu-Chek  Diabetic peripheral neuropathy -Continue home meds including gabapentin  Coronary artery disease -Continue aspirin, Bystolic, statin, Cardizem  History of seizure -On Depakote  DVT prophylaxis: Lovenox Code Status: Full code Family Communication: None at bedside Disposition Plan: Home likely in 24 hours, transfer from stepdown to floor  Consultants:   Pulmonary  Procedures:   None  Antimicrobials:   None   Subjective: -Breathing better overall, continues to have wheezing   Objective: Vitals:   03/16/18 2322 03/17/18 0005 03/17/18 0507 03/17/18 0740  BP:  (!) 161/72 (!) 149/72 (!) 164/72  Pulse:  73 62   Resp:  18 14   Temp:  97.6 F (36.4 C) 98.2 F (36.8 C) (!) 97.4 F (36.3 C)  TempSrc:  Oral Oral Oral  SpO2: 97%   100%  Weight:        Intake/Output Summary (Last 24 hours) at 03/17/2018 1106 Last data filed at 03/17/2018 5784 Gross per 24 hour  Intake 800 ml  Output 503 ml  Net 297 ml   Filed Weights   03/15/18 1037  Weight: 74.4 kg    Examination: General exam: Appears calm and comfortable  Respiratory system: Audible upper airway wheeze noted  Cardiovascular system: S1 & S2 heard, RRR Gastrointestinal system: Abdomen is nondistended, soft and nontender. No organomegaly or masses felt. Normal bowel sounds heard. Central nervous system: Alert and oriented. No focal neurological deficits. Extremities:  No edema, mild swelling of the right knee Skin: No rashes, lesions or ulcers Psychiatry: Judgement and insight appear normal. Mood & affect appropriate.     Data Reviewed:   CBC: Recent Labs  Lab 03/15/18 1042 03/16/18 0541  WBC 14.8* 12.5*  NEUTROABS 8.7*  --   HGB 11.2* 10.3*  HCT 37.6 33.4*  MCV 102.2* 98.2  PLT 318 696   Basic Metabolic Panel: Recent Labs  Lab 03/15/18 1042 03/16/18 0541 03/17/18 0221  NA 139 135 136  K 3.4* 4.0 4.4  CL 102 99 99  CO2 24 24 25   GLUCOSE 126* 190* 166*  BUN 20 25* 33*  CREATININE 1.24* 1.11* 1.47*  CALCIUM 8.8* 8.9 9.4  MG  --   --  2.3   GFR: Estimated Creatinine Clearance: 33.3 mL/min (A) (by C-G formula based on SCr of  1.47 mg/dL (H)). Liver Function Tests: Recent Labs  Lab 03/15/18 1042  AST 18  ALT 15  ALKPHOS 48  BILITOT 0.6  PROT 7.1  ALBUMIN 3.6   No results for input(s): LIPASE, AMYLASE in the last 168 hours. No results for input(s): AMMONIA in the last 168 hours. Coagulation Profile: No results for input(s): INR, PROTIME in the last 168 hours. Cardiac Enzymes: No results for  input(s): CKTOTAL, CKMB, CKMBINDEX, TROPONINI in the last 168 hours. BNP (last 3 results) No results for input(s): PROBNP in the last 8760 hours. HbA1C: No results for input(s): HGBA1C in the last 72 hours. CBG: Recent Labs  Lab 03/16/18 0708 03/16/18 1151 03/16/18 1708 03/16/18 2155 03/17/18 0745  GLUCAP 164* 158* 234* 213* 129*   Lipid Profile: No results for input(s): CHOL, HDL, LDLCALC, TRIG, CHOLHDL, LDLDIRECT in the last 72 hours. Thyroid Function Tests: No results for input(s): TSH, T4TOTAL, FREET4, T3FREE, THYROIDAB in the last 72 hours. Anemia Panel: No results for input(s): VITAMINB12, FOLATE, FERRITIN, TIBC, IRON, RETICCTPCT in the last 72 hours. Sepsis Labs: No results for input(s): PROCALCITON, LATICACIDVEN in the last 168 hours.  Recent Results (from the past 240 hour(s))  MRSA PCR Screening     Status: None   Collection Time: 03/16/18 11:47 AM  Result Value Ref Range Status   MRSA by PCR NEGATIVE NEGATIVE Final    Comment:        The GeneXpert MRSA Assay (FDA approved for NASAL specimens only), is one component of a comprehensive MRSA colonization surveillance program. It is not intended to diagnose MRSA infection nor to guide or monitor treatment for MRSA infections. Performed at [facility], [place] [address], [place], [place] [address]          Radiology Studies: No results found.      Scheduled Meds: . aspirin EC  81 mg Oral Daily  . atorvastatin  40 mg Oral QHS  . diltiazem  240 mg Oral Daily  . divalproex  1,000 mg Oral QHS  . enoxaparin (LOVENOX) injection  40 mg Subcutaneous Q24H  . estradiol  1 mg Oral Daily  . gabapentin  100 mg Oral QID  . hydrALAZINE  25 mg Oral BID  . insulin aspart  0-15 Units Subcutaneous TID WC  . insulin aspart  0-5 Units Subcutaneous QHS  . ipratropium-albuterol  3 mL Nebulization TID  . loratadine  10 mg Oral Daily  . nebivolol  2.5 mg Oral Daily  . pantoprazole  80 mg Oral Q1200  . potassium  chloride SA  20 mEq Oral Daily  . predniSONE  20 mg Oral Q breakfast  . sodium chloride flush  3 mL Intravenous Q12H  . torsemide  20 mg Oral Daily   Continuous Infusions: . famotidine (PEPCID) IV Stopped (03/16/18 2210)     LOS: 0 days   Time spent  35 mins    Domenic Polite, MD [facility] Page via [email or web address]  If 7PM-7AM, please contact night-coverage [email or web address] Password [email or web address] 03/17/2018, 11:06 AM

## 2018-03-17 NOTE — Evaluation (Signed)
Physical Therapy Evaluation Patient Details Name: [name] MRN: [id number] DOB: [birth year] Today's Date: 03/17/2018   History of Present Illness  67 year old female with history of chronic diastolic CHF, CAD, diabetes mellitus type 2, long-standing history of asthma, vocal cord paralysis came to the hospital with shortness of breath.    Clinical Impression  Pt admitted with above diagnosis. Pt currently with functional limitations due to the deficits listed below (see PT Problem List). PTA pt living with husband, independent with all mobility and working. Today patient walking short hallway distances, becoming DOE 3/4 while maintaining 98% SpO2 on RA.  Pt will benefit from skilled PT to increase their independence and safety with mobility to allow discharge to the venue listed below.       Follow Up Recommendations (OP cardiopulmonary rehab)    Equipment Recommendations  None recommended by PT    Recommendations for Other Services       Precautions / Restrictions Restrictions Weight Bearing Restrictions: No      Mobility  Bed Mobility Overal bed mobility: Modified Independent                Transfers Overall transfer level: Modified independent                  Ambulation/Gait Ambulation/Gait assistance: Min guard;Supervision Gait Distance (Feet): 100 Feet Assistive device: None Gait Pattern/deviations: Step-through pattern Gait velocity: decreased   General Gait Details: pt ambulating with mild unsteadiness, no AD, DOE 3/4 after 100' SpO2 98% at end of session on RA.  Stairs            Wheelchair Mobility    Modified Rankin (Stroke Patients Only)       Balance                                             Pertinent Vitals/Pain Pain Assessment: No/denies pain    Home Living Family/patient expects to be discharged to:: Private residence Living Arrangements: Spouse/significant other;Other relatives Available Help at  Discharge: Available 24 hours/day;Family Type of Home: House Home Access: Level entry     Home Layout: One level Home Equipment: Walker - 2 wheels      Prior Function Level of Independence: Independent         Comments: independent community ambulation, working, driving, independent with ADL     Hand Dominance        Extremity/Trunk Assessment   Upper Extremity Assessment Upper Extremity Assessment: Overall WFL for tasks assessed    Lower Extremity Assessment Lower Extremity Assessment: Overall WFL for tasks assessed       Communication   Communication: No difficulties  Cognition Arousal/Alertness: Awake/alert                                            General Comments      Exercises     Assessment/Plan    PT Assessment Patient needs continued PT services  PT Problem List Decreased activity tolerance;Cardiopulmonary status limiting activity       PT Treatment Interventions DME instruction;Gait training;Stair training;Functional mobility training;Therapeutic activities;Therapeutic exercise    PT Goals (Current goals can be found in the Care Plan section)  Acute Rehab PT Goals Patient Stated Goal: go home and find out  why she keeps having asthma attacks PT Goal Formulation: With patient Time For Goal Achievement: 03/31/18 Potential to Achieve Goals: Good    Frequency Min 3X/week   Barriers to discharge        Co-evaluation               AM-PAC PT "6 Clicks" Daily Activity  Outcome Measure Difficulty turning over in bed (including adjusting bedclothes, sheets and blankets)?: None Difficulty moving from lying on back to sitting on the side of the bed? : None Difficulty sitting down on and standing up from a chair with arms (e.g., wheelchair, bedside commode, etc,.)?: None Help needed moving to and from a bed to chair (including a wheelchair)?: None Help needed walking in hospital room?: A Little Help needed climbing 3-5  steps with a railing? : A Little 6 Click Score: 22    End of Session Equipment Utilized During Treatment: Gait belt Activity Tolerance: Patient tolerated treatment well Patient left: in chair;with call bell/phone within reach Nurse Communication: Mobility status PT Visit Diagnosis: Unsteadiness on feet (R26.81)    Time: 1248-1310 PT Time Calculation (min) (ACUTE ONLY): 22 min   Charges:   PT Evaluation $PT Eval Moderate Complexity: 1 Mod          Reinaldo Berber, PT, DPT Acute Rehabilitation Services Pager: [phone] Office: [phone]    Reinaldo Berber 03/17/2018, 3:37 PM

## 2018-03-18 DIAGNOSIS — J45901 Unspecified asthma with (acute) exacerbation: Secondary | ICD-10-CM | POA: Diagnosis not present

## 2018-03-18 DIAGNOSIS — J209 Acute bronchitis, unspecified: Secondary | ICD-10-CM | POA: Diagnosis not present

## 2018-03-18 LAB — BASIC METABOLIC PANEL
Anion gap: 7 (ref 5–15)
BUN: 31 mg/dL — AB (ref 8–23)
CALCIUM: 8.5 mg/dL — AB (ref 8.9–10.3)
CO2: 28 mmol/L (ref 22–32)
CREATININE: 1.22 mg/dL — AB (ref 0.44–1.00)
Chloride: 100 mmol/L (ref 98–111)
GFR, EST AFRICAN AMERICAN: 51 mL/min — AB (ref >60)
GFR, EST NON AFRICAN AMERICAN: 44 mL/min — AB (ref >60)
Glucose, Bld: 156 mg/dL — ABNORMAL HIGH (ref 70–99)
Potassium: 4.3 mmol/L (ref 3.5–5.1)
SODIUM: 135 mmol/L (ref 135–145)

## 2018-03-18 LAB — GLUCOSE, CAPILLARY
Glucose-Capillary: 135 mg/dL — ABNORMAL HIGH (ref 70–99)
Glucose-Capillary: 193 mg/dL — ABNORMAL HIGH (ref 70–99)

## 2018-03-18 MED ORDER — PREDNISONE 10 MG PO TABS: 20.0000 mg | ORAL_TABLET | Freq: Every day | ORAL | 0 refills | Status: AC

## 2018-03-18 NOTE — Progress Notes (Signed)
Referred pt to pulmonary rehab. Pt seems eager to participate.  1150-1200

## 2018-03-18 NOTE — Progress Notes (Signed)
Physical Therapy Treatment Patient Details Name: [name] MRN: [id number] DOB: 10/13/1950 Today's Date: 03/18/2018    History of Present Illness 67 year old female with history of chronic diastolic CHF, CAD, diabetes mellitus type 2, long-standing history of asthma, vocal cord paralysis came to the hospital with shortness of breath.    PT Comments    Patient received in bed, very pleasant and willing to participate in PT this morning. Continues to perform bed mobility and transfers with Mod(I), and was able to progress gait significantly today without unsteadiness or SOB/DOE noted this session, remained asymptomatic while maintaining constant conversation with PT in hallway during gait of 245f. She was left sitting at EOB with all needs met and questions/concerns addressed this morning.     Follow Up Recommendations  Outpatient PT     Equipment Recommendations  None recommended by PT    Recommendations for Other Services       Precautions / Restrictions Restrictions Weight Bearing Restrictions: No    Mobility  Bed Mobility Overal bed mobility: Modified Independent                Transfers Overall transfer level: Modified independent                  Ambulation/Gait Ambulation/Gait assistance: Supervision Gait Distance (Feet): 200 Feet Assistive device: None Gait Pattern/deviations: Step-through pattern Gait velocity: decreased   General Gait Details: no AD, no unsteadiness or DOE noted; gait mechanics generally WNL and patient without SOB with activity, able to maintain constant conversation with PT    Stairs             Wheelchair Mobility    Modified Rankin (Stroke Patients Only)       Balance Overall balance assessment: Modified Independent                                          Cognition Arousal/Alertness: Awake/alert Behavior During Therapy: WFL for tasks assessed/performed Overall Cognitive Status:  Within Functional Limits for tasks assessed                                        Exercises      General Comments        Pertinent Vitals/Pain Pain Assessment: No/denies pain    Home Living                      Prior Function            PT Goals (current goals can now be found in the care plan section) Acute Rehab PT Goals Patient Stated Goal: go home and find out why she keeps having asthma attacks PT Goal Formulation: With patient Time For Goal Achievement: 03/31/18 Potential to Achieve Goals: Good Progress towards PT goals: Progressing toward goals    Frequency    Min 3X/week      PT Plan Current plan remains appropriate    Co-evaluation              AM-PAC PT "6 Clicks" Daily Activity  Outcome Measure  Difficulty turning over in bed (including adjusting bedclothes, sheets and blankets)?: None Difficulty moving from lying on back to sitting on the side of the bed? : None Difficulty sitting down on and standing up from  a chair with arms (e.g., wheelchair, bedside commode, etc,.)?: None Help needed moving to and from a bed to chair (including a wheelchair)?: None Help needed walking in hospital room?: None Help needed climbing 3-5 steps with a railing? : A Little 6 Click Score: 23    End of Session   Activity Tolerance: Patient tolerated treatment well Patient left: in bed;with call bell/phone within reach(sitting at EOB )   PT Visit Diagnosis: Unsteadiness on feet (R26.81)     Time: 2111-7356 PT Time Calculation (min) (ACUTE ONLY): 13 min  Charges:  $Gait Training: 8-22 mins                     Deniece Ree PT, DPT, CBIS  Supplemental Physical Therapist [place]    Pager [phone] Acute Rehab Office [phone]

## 2018-03-20 ENCOUNTER — Ambulatory Visit (INDEPENDENT_AMBULATORY_CARE_PROVIDER_SITE_OTHER): Payer: Medicare Other | Admitting: Internal Medicine

## 2018-03-20 ENCOUNTER — Encounter: Payer: Self-pay | Admitting: Internal Medicine

## 2018-03-20 DIAGNOSIS — R058 Other specified cough: Secondary | ICD-10-CM

## 2018-03-20 DIAGNOSIS — R05 Cough: Secondary | ICD-10-CM

## 2018-03-20 MED ORDER — PREDNISONE 10 MG PO TABS: ORAL_TABLET | ORAL | 11 refills | Status: DC

## 2018-03-20 NOTE — Progress Notes (Signed)
Subjective:   Patient ID: [name], female    DOB: January 08, 1951    MRN: [id number]    Brief patient profile:  26 yobf quit smoking 2005 with nl pfts 2012  with recurrent non-specific resp flares with nl pfts during flares c/w pseudoasthma.      History of Present Illness  01/24/2016  Extended post hosp f/u ov/ transition of care/ /[name] re: dtca asthma/ vcd symb 160 and pred 5 x 10 mg daily  Chief Complaint  Patient presents with  . Follow-up    Pt. recently got out the [facility] for an asthma attack, Pt. states her breathing remains the same, coughing with some yellow to white mucus,Has been needing to use Ventolin more often  last doing well 2 months prior to OV   While on Symb 160/ gerd rx but not dosing ac as rec  Waking up prematurely x 2 months with cough/wheeze/ sob but never contacted me as per the instructions rec Plan A = Automatic = dulera 100 Take 2 puffs first thing in am and then another 2 puffs about 12 hours later.                                      Nexum 40 mg Take 30-60 min before first meal of the day and Pepcid AC 20 mg  at bedtime Work on inhaler technique  Plan B = Backup Only use your albuterol as a rescue medication Plan C = Crisis - only use your albuterol nebulizer if you first try Plan B and it fails to help > ok to use the nebulizer up to every 4 hours but if start needing it regularly call for immediate appointment GERD diet  Take delsym two tsp every 12 hours and use the flutter valve as much as possible and supplement if needed with  tramadol 50 mg up to 2 every 4 hours to suppress the urge to cough  Once you have eliminated the cough for 3 straight days try reducing the tramadol first,  then the delsym as tolerated.   Prednisone 5 mg x 8, then 6, then 4 then 2, then 1 daily x 2 days and off  please schedule a follow up office visit in 2 weeks, sooner if needed with all meds/ inhalers/ neb solutions in hand     09/18/2016  f/u ov/[name] re:  UACS vs  Asthma brought med calendar but not updated / on symb 160 / max gerd rx  Chief Complaint  Patient presents with  . Follow-up    Pt c/o occ wheezing. She has not used rescue inhaler or neb recently.   still feels wheezing  And doe = MMRC1 = can walk nl pace, flat grade, can't hurry or go uphills or steps s sob rec Change the symbicort to 80 Take 2 puffs first thing in am and then another 2 puffs about 12 hours later.  Work on inhaler technique:  Use the med calendar daily to organize your medications as a "checklist" See [name] NP in 4  weeks with all your medications> never happened    Admit date: 12/19/2017 Discharge date: 12/24/2017  D/c home with steroids taper, monitor blood glucose while on steroids She reports run out of pulmicort nebs, new prescription provided, she is to follow up with Dr Melvyn Novas to further discuss asthma/copd meds, she is concerned about cost of these meds  Discharge  Diagnoses:      Active Hospital Problems   Diagnosis Date Noted  . Acute bronchitis with asthma with acute exacerbation 01/16/2016  . Acute renal failure superimposed on stage 3 chronic kidney disease ([place]) 07/17/2016  . Chronic diastolic CHF (congestive heart failure) ([place]) 07/17/2016  . Acute respiratory failure with hypoxia ([place])   . Upper airway cough syndrome vs Asthma  08/28/2014  . Diabetes mellitus type 2, controlled ([place]) 08/06/2014  . Essential hypertension 07/21/2008           12/28/2017 ext post hosp f/u ov/[name] re: confused with meds again / only slightly better breathing  Chief Complaint  Patient presents with  . Follow-up    Breathing has improved slightly. She has been out of brovana due to high cost x 3 days. She has had to use her albuterol inhaler and neb daily since then.   Dyspnea:  50 ft = About the same as usual Cough: after supper is the worse, but not much mucus Sleeping: wakes up coughing 30 degrees due to sensation of reflux    SABA use: last 5 h prior to OV     02: none  Sleeping at 30 degrees    rec Plan A = Automatic = Performist 20 mcg one vial  with pulmocort one half vial twice daily  Gabapentin 100 mg three times daily Nexium Take 30- 60 min before your first and last meals of the day and zantac (ranitidine) at bedtime  Along with chlorpheniramine 4 mg 1 or 2 also at bedtime (over the counter)  Continue prednisone 10 mg every day  Plan B = Backup for breathing Only use your albuterol as a rescue medication  Plan B = Backup for coughing  Add tramadol 50 mg 1-2 every 4 hours if needed  Plan C = Crisis - only use your albuterol nebulizer if you first try Plan B and it fails to help > ok to use the nebulizer up to every 4 hours but if start needing it regularly call for immediate appointment     03/11/2018  f/u ov/[name] re: 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 Follow-up    Cough had improved and then started back 1 day ago.  Cough is now non prod.  She is using her albuterol inhaler 2 x daily on average. She has not needed albuterol neb.    Dyspnea:  MMRC2 = can't walk a nl pace on a flat grade s sob but does fine slow and flat slowed by knee  Cough: noct > day  Sleeping: 45 degrees due to subjective reflux despite max acid suppresion  SABA use: as above - never noct 02: none   rec Gabapentin 100 mg four  times daily  Take chortab 4 mg x 2 at bedtime and up to every 4 hours during the day as needed for drainage  Take delsym two tsp every 12 hours and supplement if needed with  tramadol 50 mg up to 2 every 4 hours to suppress the urge to cough. Swallowing water and/or using ice chips/non mint and menthol containing candies (such as lifesavers or sugarless jolly ranchers) are also effective.  You should rest your voice and avoid activities that you know make you cough. Once you have eliminated the cough for 3 straight days try reducing the tramadol first,  then the delsym as tolerated.  Plan D = Prednisone x 6  days if not cough/ sob not better See calendar for specific  medication instructions and bring it back for each and every office visit for every healthcare provider you see.  Without it,  you may not receive the best quality medical care that we feel you deserve. Please schedule a follow up office visit in 4 weeks, call sooner if needed with all medications /inhalers/ solutions in hand so we can verify exactly what you are taking. This includes all medications from all doctors and over the [place]nie separate them into two bags:  the ones you take automatically, no matter what, vs the ones you take just when you feel you need them "BAG #2 is UP TO YOU"  - this will really help [place] help you take your medications more effectively.    03/15/18 back to ER - did not activate action plan prior (never took more than 1 tramadol every 12 hours to control cough.   03/20/2018  Extended post hops  f/u ov/[name] re: transition of care re   vcd vs asthma  Chief Complaint  Patient presents with  . Follow-up    recent admission 03/15/18- pt reports of wheezing, sob with exertion, no prod cough & chest tightness.    Dyspnea:  MMRC4  = sob if tries to leave home or while getting dressed   - no better now vs during hosp stay. Seen by Dr [place] Bells who strongly supported vcd dx during admit Cough: dry cough settles down overnight and with plb as does the "wheeze"  Sleeping: 45 degrees SABA use: last neb 8 h prior to OV  "did not help" 02: no    No obvious day to day or daytime variability or assoc excess/ purulent sputum or mucus plugs or hemoptysis or cp or chest tightness, subjective wheeze or overt sinus or hb symptoms.   Sleeping at 45 degrees  without nocturnal  or early am exacerbation  of respiratory  c/o's or need for noct saba. Also denies any obvious fluctuation of symptoms with weather or environmental changes or other aggravating or alleviating factors except as outlined above   No unusual exposure hx  or h/o childhood pna/ asthma or knowledge of premature birth.  Current Allergies, Complete Past Medical History, Past Surgical History, Family History, and Social History were reviewed in [employer] record.  ROS  The following are not active complaints unless bolded Hoarseness, sore throat, dysphagia, dental problems, itching, sneezing,  nasal congestion or discharge of excess mucus or purulent secretions, ear ache,   fever, chills, sweats, unintended wt loss or wt gain, classically pleuritic or exertional cp,  orthopnea pnd or arm/hand swelling  or leg swelling, presyncope, palpitations, abdominal pain, anorexia, nausea, vomiting, diarrhea  or change in bowel habits or change in bladder habits, change in stools or change in urine, dysuria, hematuria,  rash, arthralgias, visual complaints, headache, numbness, weakness or ataxia or problems with walking or coordination,  change in mood or  memory.        No outpatient medications have been marked as taking for the 03/20/18 encounter (Office Visit) with Tanda Rockers, MD. As unable to verify accuracy            Objective:   Physical Exam  02/23/2016   168 >  04/07/2016  168 > 05/08/2016    168 >  07/17/2016  162 > 09/18/2016    164  > 09/18/2017  160 > 12/28/2017 167  > 03/11/2018  164 > 03/20/2018   160   Anxious amb bf nad after doing  panting/ plb - did not bring flutter or meds to office  Vital signs reviewed - Note on arrival 02 sats  98% on RA     HEENT: nl dentition, turbinates bilaterally, and oropharynx. Nl external ear canals without cough reflex   NECK :  without JVD/Nodes/TM/ nl carotid upstrokes bilaterally   LUNGS: no acc muscle use,  Nl contour chest which is clear to A and P bilaterally with panting without cough on insp or exp maneuvers   CV:  RRR  no s3 or murmur or increase in P2, and no edema   ABD:  soft and nontender with nl inspiratory excursion in the supine position. No bruits or organomegaly  appreciated, bowel sounds nl  MS:  Nl gait/ ext warm without deformities, calf tenderness, cyanosis or clubbing No obvious joint restrictions   SKIN: warm and dry without lesions    NEURO:  alert, approp, nl sensorium with  no motor or cerebellar deficits apparent.         I personally reviewed images and agree with radiology impression as follows:  CXR:   03/15/18 Mild chronic bronchitic reactive airway changes with scarring at the left lung base. No alveolar pneumonia nor CHF My review:  Note lungs not hyperinflated during "acute asthma flare"

## 2018-03-20 NOTE — Patient Instructions (Addendum)
Increase gabapentin to 100 mg take 2 (=200)  four times daily as per med calendar  For cough > delsym 2 tsp every 12 hours with use of flutter valve as much as possible  and supplement with tramadol 50 mg up to 1-2 every 4 hours as needed   For breathing problems not improving with use of nebulizer > Prednisone 10 mg take  4 each am x 2 days,   2 each am x 2 days,  1 each am x 2 days and stop    GERD (REFLUX)  is an extremely common cause of respiratory symptoms just like yours , many times with no obvious heartburn at all.    It can be treated with medication, but also with lifestyle changes including elevation of the head of your bed (ideally with 6 inch  bed blocks),  Smoking cessation, avoidance of late meals, excessive alcohol, and avoid fatty foods, chocolate, peppermint, colas, red wine, and acidic juices such as orange juice.  NO MINT OR MENTHOL PRODUCTS SO NO COUGH DROPS  USE SUGARLESS CANDY INSTEAD (Jolley ranchers or Stover's or Life Savers) or even ice chips will also do - the key is to swallow to prevent all throat clearing. NO OIL BASED VITAMINS - use powdered substitutes.  Keep previous appt with your medications in 2 bags

## 2018-03-21 ENCOUNTER — Encounter: Payer: Self-pay | Admitting: Internal Medicine

## 2018-03-21 NOTE — Assessment & Plan Note (Addendum)
-   CT sinus 08/06/14 > neg  - Allergy profile 02/23/16  >  Eos 0.0 /  IgE  87 pos  RAST  Grass/ trees  - improved 05/08/2016 on gabapentin 300 tid > no change rx > d/c'd 07/2016 DgEs 10/09/17  Low range of penetration without aspiration. Otherwise, normal - Asthma exac 12/17/17- FENO 18. Significant upper airway wheeze, tx pred taper. Add neurontin 100mg  TID.  - Spirometry 12/28/2017  FEV1 1.01 (63%)  Ratio 86 5 h p last saba with prominent wheeze on exam   - 12/28/2017 rec pred 10 mg as floor but weaned off  - 03/11/2018 increased gabapentin to 100 mg qid and prednisone as "plan D " x 6 days and off   - 03/20/2018 increased gabapentin to 200 mg qid / ENT eval    I had an extended discussion with the patient reviewing all relevant studies completed to date and  lasting 25  minutes of a 40 minute post hospital office  visit on the following ongoing concerns:   1) she has not been able to process/ follow action plan to date and if she cannot or will not it's likely she'll repeat this same pattern in definitely   2) I emphasized to the patient that just like Dorothy in [employer],  She is already wearing "ruby slippers" she can click anytime she wants:  the answer to all her recurrent symptoms  is already  literally at her fingertips:   all she has to do is refer to the medicine calendar we provided her  and her problems  are each addressed in a user-friendly format, reading from left to right,  for each symptoms she's likely to develop between office visits.   3) continue to use Prednisone as "plan D" if has already followed all the other parts of her action plan first and tramadol if can't stop coughing with uses of delsym/ flutter valve demonstrated to her today (can also simply put fist over mouth when coughs and accomplish similar back pressure that improves the "wheeze'   4) agree with ent eval locally but suspect she will need to go to [facility] if not responding to local rx - in meantime  plan to push the gabapentin as high as 300 mg qid if tolerates to stop the throat clearing/ cyclical cough pattern she is stuck in.   5) f/u here q  2 weeks with all meds in hand (two bags, one for maint and the other for prns)  using a trust but verify approach to confirm accurate Medication  Reconciliation The principal here is that until we are certain that the  patients are doing what we've asked, it makes no sense to ask them to do more.      device teaching (flutter/ hfa)  extended face to face time for this visit

## 2018-04-01 ENCOUNTER — Telehealth: Payer: Self-pay | Admitting: Cardiology

## 2018-04-01 NOTE — Discharge Summary (Signed)
Physician Discharge Summary  [name] DSK:[id number] DOB: [birth year] DOA: 03/15/2018  PCP: [name], MD  Admit date: 03/15/2018 Discharge date: 03/18/2018  Time spent: 35 minutes  Recommendations for Outpatient Follow-up:  1. PCP Dr.Harris in 1 week' 2. Pulm Dr.Mike Wert in 2 weeks 3. Outpt FU with [facility] ENT for VCD   Discharge Diagnoses:    Acute resp failure due to vocal cord dysfunction   Depression with anxiety   Essential hypertension   Asthma exacerbation   CAD in native artery   Seizures (HCC)   Chronic diastolic CHF (congestive heart failure) (HCC)   DM2 (diabetes mellitus, type 2) (HCC)   CKD (chronic kidney disease), stage III ([place])   Discharge Condition: stable  Diet recommendation: DM heart healthy  Filed Weights   03/15/18 1037  Weight: 74.4 kg    History of present illness:  67 year old female with history of chronic diastolic CHF, CAD, diabetes mellitus type 2, long-standing history of asthma, vocal cord paralysis came to the hospital with shortness of breath.  Initially she was diagnosed with suspected asthma exacerbation and was admitted to the hospital  Hospital Course:   Acute respiratory failure -Secondary to vocal cord dysfunction, anxiety -initially treated as asthma exacerbation with steroids, nebs, BIPAP,  but later noted to be more upper airway wheeze with conducted sounds -Per pulmonary PFTs not consistent with asthma, she is followed by Dr. Clois Comber -Quickly tapered off prednisone, also continued on low dose xanax for anxiety -seen by Pulm inpatient -Brovana discontinued -Continue Xanax -Outpatient ENT follow-up for vocal cord dysfunction, recommended and referral sent to [facility]  Anxiety -Continue Xanax  Chronic diastolic congestive heart failure, appears euvolemic -Restarted torsemide 20 mg daily  Essential hypertension -Continue hydralazine, Bystolic and torsemide  Non-insulin-dependent diabetes  mellitus type 2 -Metformin resumed  Diabetic peripheral neuropathy -Continue home meds including gabapentin  Coronary artery disease -Continue aspirin, Bystolic, statin, Cardizem  History of seizure -On Depakote   Discharge Exam: Vitals:   03/18/18 0741 03/18/18 0833  BP: (!) 164/78   Pulse: 70   Resp:    Temp: 98.1 F (36.7 C)   SpO2: 100% 97%    General: AAOx3 Cardiovascular: S1S2/RRR Respiratory: scant upper airway wheeze  Discharge Instructions   Discharge Instructions    AMB referral to pulmonary rehabilitation   Complete by:  As directed    Please select a program:  Respiratory Care Services   Respiratory Care Services Diagnosis:  CHF   Program Prescription:  O2 Administration by RT, EP, or RN if SpO2<88%   Ambulatory referral to ENT   Complete by:  As directed    [facility] ENT for VOcal Cord dysfunction   Diet - low sodium heart healthy   Complete by:  As directed    Increase activity slowly   Complete by:  As directed      Allergies as of 03/18/2018      Reactions   Amlodipine Besylate Other (See Comments)   Reaction unknown   Lisinopril Other (See Comments)   Reaction unknown   Pantoprazole Sodium Other (See Comments)   Reaction unknown      Medication List    STOP taking these medications   predniSONE 10 MG tablet Commonly known as:  DELTASONE   traMADol 50 MG tablet Commonly known as:  ULTRAM     TAKE these medications   albuterol (2.5 MG/3ML) 0.083% nebulizer solution Commonly known as:  PROVENTIL Take 2.5 mg by nebulization every 4 (four) hours as  needed for wheezing or shortness of breath (((PLAN C))).   albuterol 108 (90 Base) MCG/ACT inhaler Commonly known as:  PROVENTIL HFA;VENTOLIN HFA Inhale 2 puffs into the lungs every 4 (four) hours as needed for wheezing or shortness of breath.   ALPRAZolam 0.5 MG tablet Commonly known as:  XANAX Take 1 tablet (0.5 mg total) by mouth 2 (two) times daily as needed for anxiety.    aspirin EC 81 MG tablet Take 81 mg by mouth daily.   atorvastatin 40 MG tablet Commonly known as:  LIPITOR Take 40 mg by mouth at bedtime.   Biotin 5000 MCG Caps Take 1 capsule by mouth daily.   budesonide 0.5 MG/2ML nebulizer solution Commonly known as:  PULMICORT Take 2 mLs (0.5 mg total) by nebulization 2 (two) times daily.   DELSYM 30 MG/5ML liquid Generic drug:  dextromethorphan 2 tsp every 12 hours as needed w/ flutter   diltiazem 240 MG 24 hr capsule Commonly known as:  CARDIZEM CD Take 1 capsule (240 mg total) by mouth daily.   divalproex 500 MG 24 hr tablet Commonly known as:  DEPAKOTE ER Take 2 tablets (1,000 mg total) by mouth at bedtime.   esomeprazole 40 MG capsule Commonly known as:  NEXIUM Take 1 capsule (40 mg total) by mouth 2 (two) times daily before a meal.   estradiol 1 MG tablet Commonly known as:  ESTRACE Take 1 mg by mouth daily.   fluticasone 50 MCG/ACT nasal spray Commonly known as:  FLONASE Place 2 sprays into both nostrils daily as needed for allergies or rhinitis.   formoterol 20 MCG/2ML nebulizer solution Commonly known as:  PERFOROMIST Take 2 mLs (20 mcg total) by nebulization 2 (two) times daily. J41.0   gabapentin 100 MG capsule Commonly known as:  NEURONTIN Take 1 capsule (100 mg total) by mouth 4 (four) times daily.   hydrALAZINE 25 MG tablet Commonly known as:  APRESOLINE Take 1 tablet (25 mg total) by mouth 2 (two) times daily.   metFORMIN 500 MG tablet Commonly known as:  GLUCOPHAGE Take 500 mg by mouth 2 (two) times daily with a meal.   nebivolol 2.5 MG tablet Commonly known as:  BYSTOLIC Take 2.5 mg by mouth daily.   nitroGLYCERIN 0.4 MG SL tablet Commonly known as:  NITROSTAT Place 1 tablet (0.4 mg total) under the tongue every 5 (five) minutes as needed for chest pain. x3 as needed   potassium chloride SA 20 MEQ tablet Commonly known as:  K-DUR,KLOR-CON Take 20 mEq by mouth daily.   ranitidine 150 MG  tablet Commonly known as:  ZANTAC Take 150 mg by mouth at bedtime.   torsemide 20 MG tablet Commonly known as:  DEMADEX Take 20-60 mg by mouth daily. 1-3 tabs (20mg  -60mg ) by mouth once daily     ASK your doctor about these medications   predniSONE 10 MG tablet Commonly known as:  DELTASONE Take 2 tablets (20 mg total) by mouth daily with breakfast for 1 day. Ask about: Should I take this medication?      Allergies  Allergen Reactions  . Amlodipine Besylate Other (See Comments)    Reaction unknown  . Lisinopril Other (See Comments)    Reaction unknown  . Pantoprazole Sodium Other (See Comments)    Reaction unknown   Follow-up Information    [name], MD. Schedule an appointment as soon as possible for a visit in 1 week(s).   Specialty:  Family Medicine Contact information: [address]  [address] [place] [address] [phone]        Jerline Pain, MD .   Specialty:  Cardiology Contact information: [phone] [address] [place] [address] [phone]        [facility] E[facility] Follow up.   Why:  office will call you           The results of significant diagnostics from this hospitalization (including imaging, microbiology, ancillary and laboratory) are listed below for reference.    Significant Diagnostic Studies: Dg Chest Port 1 View  Result Date: 03/15/2018 CLINICAL DATA:  Two day history of shortness of breath and cough. Chest tightness today. History of chronic asthma, CHF, hypertension, diabetes, former smoker. EXAM: PORTABLE CHEST 1 VIEW COMPARISON:  Portable chest x-ray of December 19, 2017 FINDINGS: The lungs are adequately inflated. The lung markings are coarse at the left lung base but this is stable. The cardiac silhouette is top-normal in size. The pulmonary vascularity is normal. The trachea is midline. The bony thorax exhibits no acute abnormality. IMPRESSION: Mild chronic bronchitic reactive airway changes with  scarring at the left lung base. No alveolar pneumonia nor CHF. Electronically Signed   By: David  Martinique M.D.   On: 03/15/2018 10:57    Microbiology: No results found for this or any previous visit (from the past 240 hour(s)).   Labs: Basic Metabolic Panel: No results for input(s): NA, K, CL, CO2, GLUCOSE, BUN, CREATININE, CALCIUM, MG, PHOS in the last 168 hours. Liver Function Tests: No results for input(s): AST, ALT, ALKPHOS, BILITOT, PROT, ALBUMIN in the last 168 hours. No results for input(s): LIPASE, AMYLASE in the last 168 hours. No results for input(s): AMMONIA in the last 168 hours. CBC: No results for input(s): WBC, NEUTROABS, HGB, HCT, MCV, PLT in the last 168 hours. Cardiac Enzymes: No results for input(s): CKTOTAL, CKMB, CKMBINDEX, TROPONINI in the last 168 hours. BNP: BNP (last 3 results) Recent Labs    12/20/17 0028 03/15/18 1042 03/16/18 0924  BNP 34.1 42.2 129.2*    ProBNP (last 3 results) No results for input(s): PROBNP in the last 8760 hours.  CBG: No results for input(s): GLUCAP in the last 168 hours.     Signed:  Domenic Polite MD.  [facility] 04/01/2018, 5:10 PM

## 2018-04-01 NOTE — Telephone Encounter (Signed)
° °  [employer] care callings, states they want to report a weight gain of 8.6 lbs in 11 days with swelling  in left foot and wants the patient contacted. Will also fax report

## 2018-04-01 NOTE — Telephone Encounter (Signed)
Spoke with patient who reports generally she feel good but has some wheezing.  Reports has more edema on the left side and her wt is up.  She believes these changes are r/t to foods she consumed over the Thanksgiving holiday.  She has been taking Demadex 20 mg daily until yesterday when she took 2 tablets.  Today she has had 1 tablet and plans to take another at 2 pm.  The extra dose she took yesterday has not seemed to help any at this point.  Advised pt to take 2 tablets at 2 pm as her order states she can take up to 60 mg a day.  She will do this and will keep a closer eye on her NA+ and fluid intake.  She will also keep feet/legs elevated and is to wear compression hose.  She will c/b if no improvement in her edema/wt.  She was grateful for the call back and guidance.

## 2018-04-04 ENCOUNTER — Telehealth: Payer: Self-pay | Admitting: *Deleted

## 2018-04-04 DIAGNOSIS — J4550 Severe persistent asthma, uncomplicated: Secondary | ICD-10-CM | POA: Insufficient documentation

## 2018-04-04 DIAGNOSIS — K219 Gastro-esophageal reflux disease without esophagitis: Secondary | ICD-10-CM | POA: Insufficient documentation

## 2018-04-04 NOTE — Telephone Encounter (Signed)
Called pt re receiving an alert report form [place] the patient's weight is 168.2 and this has been same for 3-4 days notes left sided edema ,SOB, and wheezing Per pt has appt with ENT re wheezing in throat area .Currently taking Torsemide 20 mg 2 tabs in am and 1 tab in pm. Will discuss with Dr Marlou Porch .Adonis Housekeeper

## 2018-04-05 ENCOUNTER — Ambulatory Visit (INDEPENDENT_AMBULATORY_CARE_PROVIDER_SITE_OTHER): Payer: Medicare Other | Admitting: Internal Medicine

## 2018-04-05 ENCOUNTER — Encounter: Payer: Self-pay | Admitting: Internal Medicine

## 2018-04-05 DIAGNOSIS — R0609 Other forms of dyspnea: Secondary | ICD-10-CM | POA: Diagnosis not present

## 2018-04-05 DIAGNOSIS — J4530 Mild persistent asthma, uncomplicated: Secondary | ICD-10-CM | POA: Diagnosis not present

## 2018-04-05 DIAGNOSIS — R05 Cough: Secondary | ICD-10-CM | POA: Diagnosis not present

## 2018-04-05 DIAGNOSIS — R058 Other specified cough: Secondary | ICD-10-CM

## 2018-04-05 MED ORDER — BUDESONIDE 0.25 MG/2ML IN SUSP: RESPIRATORY_TRACT | 12 refills | Status: DC

## 2018-04-05 MED ORDER — GABAPENTIN 300 MG PO CAPS: 300.0000 mg | ORAL_CAPSULE | Freq: Four times a day (QID) | ORAL | 2 refills | Status: DC

## 2018-04-05 NOTE — Telephone Encounter (Signed)
Will also need a f/u appt scheduled with either Truitt Merle, NP or Dr Marlou Porch.

## 2018-04-05 NOTE — Patient Instructions (Addendum)
Gabapentin 300 mg four times daily   Budesonide 0.25 mcg twice daily with performist  If breathing worse > Prednisone 10 mg take  4 each am x 2 days,   2 each am x 2 days,  1 each am x 2 days and stop   For cough > Take delsym two tsp every 12 hours and flutter valve as much as possible supplement if needed with  tramadol 50 mg (or tylenol #3) up to 2 every 4 hours to suppress the urge to cough. Swallowing water and/or using ice chips/non mint and menthol containing candies (such as lifesavers or sugarless jolly ranchers) are also effective.  You should rest your voice and avoid activities that you know make you cough.  Once you have eliminated the cough for 3 straight days try reducing the tramadol first,  then the delsym as tolerated.  nexium (esmaprazole)  Take 30- 60 min before your first and last meals of the day   Please schedule a follow up office visit in 2  weeks, sooner if needed - needs full pfts on return

## 2018-04-05 NOTE — Telephone Encounter (Addendum)
Per pt has been taking Torsemide 20 mg 2 tabs  bid and today pt states weight is now 169.8 up from yesterday.Will forward to Dr Marlou Porch for review .Adonis Housekeeper

## 2018-04-05 NOTE — Progress Notes (Addendum)
Subjective:   Patient ID: [name], female    DOB: 1950-11-25    MRN: [id number]    Brief patient profile:  13 yobf quit smoking 2005 with nl pfts 2012  with recurrent non-specific resp flares with nl pfts during flares c/w pseudoasthma.     History of Present Illness  01/24/2016  Extended post hosp f/u ov/ transition of care/ /[name] re: dtca asthma/ vcd symb 160 and pred 5 x 10 mg daily  Chief Complaint  Patient presents with  . Follow-up    Pt. recently got [facility] the [facility] for an asthma attack, Pt. states her breathing remains the same, coughing with some yellow to white mucus,Has been needing to use Ventolin more often  last doing well 2 months prior to OV   While on Symb 160/ gerd rx but not dosing ac as rec  Waking up prematurely x 2 months with cough/wheeze/ sob but never contacted me as per the instructions rec Plan A = Automatic = dulera 100 Take 2 puffs first thing in am and then another 2 puffs about 12 hours later.                                      Nexum 40 mg Take 30-60 min before first meal of the day and Pepcid AC 20 mg  at bedtime Work on inhaler technique  Plan B = Backup Only use your albuterol as a rescue medication Plan C = Crisis - only use your albuterol nebulizer if you first try Plan B and it fails to help > ok to use the nebulizer up to every 4 hours but if start needing it regularly call for immediate appointment GERD diet  Take delsym two tsp every 12 hours and use the flutter valve as much as possible and supplement if needed with  tramadol 50 mg up to 2 every 4 hours to suppress the urge to cough  Once you have eliminated the cough for 3 straight days try reducing the tramadol first,  then the delsym as tolerated.   Prednisone 5 mg x 8, then 6, then 4 then 2, then 1 daily x 2 days and off  please schedule a follow up office visit in 2 weeks, sooner if needed with all meds/ inhalers/ neb solutions in hand     09/18/2016  f/u ov/[name] re:  UACS vs  Asthma brought med calendar but not updated / on symb 160 / max gerd rx  Chief Complaint  Patient presents with  . Follow-up    Pt c/o occ wheezing. She has not used rescue inhaler or neb recently.   still feels wheezing  And doe = MMRC1 = can walk nl pace, flat grade, can't hurry or go uphills or steps s sob rec Change the symbicort to 80 Take 2 puffs first thing in am and then another 2 puffs about 12 hours later.  Work on inhaler technique:  Use the med calendar daily to organize your medications as a "checklist" See [name] NP in 4  weeks with all your medications> never happened    Admit date: 12/19/2017 Discharge date: 12/24/2017  D/c home with steroids taper, monitor blood glucose while on steroids She reports run out of pulmicort nebs, new prescription provided, she is to follow up with Dr Melvyn Novas to further discuss asthma/copd meds, she is concerned about cost of these meds  Discharge Diagnoses:  Active Hospital Problems   Diagnosis Date Noted  . Acute bronchitis with asthma with acute exacerbation 01/16/2016  . Acute renal failure superimposed on stage 3 chronic kidney disease ([place]) 07/17/2016  . Chronic diastolic CHF (congestive heart failure) ([place]) 07/17/2016  . Acute respiratory failure with hypoxia ([place])   . Upper airway cough syndrome vs Asthma  08/28/2014  . Diabetes mellitus type 2, controlled ([place]) 08/06/2014  . Essential hypertension 07/21/2008           12/28/2017 ext post hosp f/u ov/[name] re: confused with meds again / only slightly better breathing  Chief Complaint  Patient presents with  . Follow-up    Breathing has improved slightly. She has been out of brovana due to high cost x 3 days. She has had to use her albuterol inhaler and neb daily since then.   Dyspnea:  50 ft = About the same as usual Cough: after supper is the worse, but not much mucus Sleeping: wakes up coughing 30 degrees due to sensation of reflux    SABA use: last 5 h prior to OV     02: none  Sleeping at 30 degrees    rec Plan A = Automatic = Performist 20 mcg one vial  with pulmocort one half vial twice daily  Gabapentin 100 mg three times daily Nexium Take 30- 60 min before your first and last meals of the day and zantac (ranitidine) at bedtime  Along with chlorpheniramine 4 mg 1 or 2 also at bedtime (over the counter)  Continue prednisone 10 mg every day  Plan B = Backup for breathing Only use your albuterol as a rescue medication  Plan B = Backup for coughing  Add tramadol 50 mg 1-2 every 4 hours if needed  Plan C = Crisis - only use your albuterol nebulizer if you first try Plan B and it fails to help > ok to use the nebulizer up to every 4 hours but if start needing it regularly call for immediate appointment     03/11/2018  f/u ov/[name] re: 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 Follow-up    Cough had improved and then started back 1 day ago.  Cough is now non prod.  She is using her albuterol inhaler 2 x daily on average. She has not needed albuterol neb.    Dyspnea:  MMRC2 = can't walk a nl pace on a flat grade s sob but does fine slow and flat slowed by knee  Cough: noct > day  Sleeping: 45 degrees due to subjective reflux despite max acid suppresion  SABA use: as above - never noct 02: none   rec Gabapentin 100 mg four  times daily  Take chortab 4 mg x 2 at bedtime and up to every 4 hours during the day as needed for drainage  Take delsym two tsp every 12 hours and supplement if needed with  tramadol 50 mg up to 2 every 4 hours to suppress the urge to cough. Swallowing water and/or using ice chips/non mint and menthol containing candies (such as lifesavers or sugarless jolly ranchers) are also effective.  You should rest your voice and avoid activities that you know make you cough. Once you have eliminated the cough for 3 straight days try reducing the tramadol first,  then the delsym as tolerated.  Plan D = Prednisone x 6  days if not cough/ sob not better See calendar for specific medication instructions and bring it back  for each and every office visit for every healthcare provider you see.  Without it,  you may not receive the best quality medical care that we feel you deserve. Please schedule a follow up office visit in 4 weeks, call sooner if needed with all medications /inhalers/ solutions in hand so we can verify exactly what you are taking. This includes all medications from all doctors and over the [place] separate them into two bags:  the ones you take automatically, no matter what, vs the ones you take just when you feel you need them "BAG #2 is UP TO YOU"  - this will really help [place] help you take your medications more effectively.    03/15/18 back to ER - did not activate action plan prior (never took more than 1 tramadol every 12 hours to control cough.   03/20/2018  Extended post hops  f/u ov/[name] re: transition of care re   vcd vs asthma  Chief Complaint  Patient presents with  . Follow-up    recent admission 03/15/18- pt reports of wheezing, sob with exertion, no prod cough & chest tightness.   Dyspnea:  MMRC4  = sob if tries to leave home or while getting dressed   - no better now vs during hosp stay. Seen by Dr [place] Bells who strongly supported vcd dx during admit Cough: dry cough settles down overnight and with plb as does the "wheeze"  Sleeping: 45 degrees SABA use: last neb 8 h prior to OV  "did not help" 02: no  rec Increase gabapentin to 100 mg take 2 (=200)  four times daily as per med calendar For cough > delsym 2 tsp every 12 hours with use of flutter valve as much as possible  and supplement with tramadol 50 mg up to 1-2 every 4 hours as needed  For breathing problems not improving with use of nebulizer > Prednisone 10 mg take  4 each am x 2 days,   2 each am x 2 days,  1 each am x 2 days and stop  GERD diet / lifestyle recs Keep previous appt with your medications in 2 bags     04/05/2018  f/u ov/[name] re: uacs/ VCD - brought all meds but confused with gen vs trade names, could not tell me how she is supposed to take esmeprazole when I handed it to her.  Chief Complaint  Patient presents with  . Follow-up  Dyspnea:  50 ft  Cough: better  Sleeping: 45 degrees SABA use: twice a day hfa , rare neb 02: none     No obvious day to day or daytime variability or assoc excess/ purulent sputum or mucus plugs or hemoptysis or cp or chest tightness, subjective wheeze or overt sinus or hb symptoms.   Sleeping as above  without nocturnal  or early am exacerbation  of respiratory  c/o's or need for noct saba. Also denies any obvious fluctuation of symptoms with weather or environmental changes or other aggravating or alleviating factors except as outlined above   No unusual exposure hx or h/o childhood pna/ asthma or knowledge of premature birth.  Current Allergies, Complete Past Medical History, Past Surgical History, Family History, and Social History were reviewed in [employer] record.  ROS  The following are not active complaints unless bolded Hoarseness, sore throat, dysphagia, dental problems, itching, sneezing,  nasal congestion or discharge of excess mucus or purulent secretions, ear ache,   fever, chills, sweats, unintended wt loss  or wt gain, classically pleuritic or exertional cp,  orthopnea pnd or arm/hand swelling  or leg swelling, presyncope, palpitations, abdominal pain, anorexia, nausea, vomiting, diarrhea  or change in bowel habits or change in bladder habits, change in stools or change in urine, dysuria, hematuria,  rash, arthralgias, visual complaints, headache, numbness, weakness or ataxia or problems with walking or coordination,  change in mood or  memory.        Current Meds  Medication Sig  . acetaminophen-codeine (TYLENOL #3) 300-30 MG tablet Take 1 tablet by mouth every 6 (six) hours as needed. for pain  . albuterol (PROAIR  HFA) 108 (90 Base) MCG/ACT inhaler Inhale 2 puffs into the lungs every 4 (four) hours as needed for wheezing or shortness of breath.  Marland Kitchen albuterol (PROVENTIL) (2.5 MG/3ML) 0.083% nebulizer solution Take 2.5 mg by nebulization every 4 (four) hours as needed for wheezing or shortness of breath (((PLAN C))).  Marland Kitchen ALPRAZolam (XANAX) 0.5 MG tablet Take 1 tablet (0.5 mg total) by mouth 2 (two) times daily as needed for anxiety.  Marland Kitchen aspirin EC 81 MG tablet Take 81 mg by mouth daily.   Marland Kitchen atorvastatin (LIPITOR) 40 MG tablet Take 40 mg by mouth at bedtime.  . Biotin 5000 MCG CAPS Take 1 capsule by mouth daily.  Marland Kitchen diltiazem (CARDIZEM CD) 240 MG 24 hr capsule Take 1 capsule (240 mg total) by mouth daily.  . divalproex (DEPAKOTE ER) 500 MG 24 hr tablet Take 2 tablets (1,000 mg total) by mouth at bedtime.  Marland Kitchen esomeprazole (NEXIUM) 40 MG capsule Take 1 capsule (40 mg total) by mouth 2 (two) times daily before a meal.  . estradiol (ESTRACE) 1 MG tablet Take 1 mg by mouth daily.   . fluticasone (FLONASE) 50 MCG/ACT nasal spray Place 2 sprays into both nostrils daily as needed for allergies or rhinitis.  . formoterol (PERFOROMIST) 20 MCG/2ML nebulizer solution Take 2 mLs (20 mcg total) by nebulization 2 (two) times daily. J41.0  . hydrALAZINE (APRESOLINE) 25 MG tablet Take 1 tablet (25 mg total) by mouth 2 (two) times daily.  . metFORMIN (GLUCOPHAGE) 500 MG tablet Take 500 mg by mouth 2 (two) times daily with a meal.  . nebivolol (BYSTOLIC) 2.5 MG tablet Take 2.5 mg by mouth daily.  . nitroGLYCERIN (NITROSTAT) 0.4 MG SL tablet Place 1 tablet (0.4 mg total) under the tongue every 5 (five) minutes as needed for chest pain. x3 as needed  . potassium chloride SA (K-DUR,KLOR-CON) 20 MEQ tablet Take 20 mEq by mouth daily.   . predniSONE (DELTASONE) 10 MG tablet Take  4 each am x 2 days,   2 each am x 2 days,  1 each am x 2 days and stop  . ranitidine (ZANTAC) 150 MG tablet Take 150 mg by mouth at bedtime.   . torsemide  (DEMADEX) 20 MG tablet Take 20-60 mg by mouth daily. 1-3 tabs (20mg  -60mg ) by mouth once daily  . traMADol (ULTRAM) 50 MG tablet TAKE 1 TABLET BY MOUTH EVERY 4 HOURS AS NEEDED FOR UP TO 5 DAYS (FOR COUGH OR PAIN)  . [ budesonide (PULMICORT) 0.5 MG/2ML nebulizer solution Take 2 mLs (0.5 mg total) by nebulization 2 (two) times daily.  Marland Kitchen  ] gabapentin (NEURONTIN) 100 MG capsule Take 1 capsule (100 mg total) by mouth 4 (four) times daily.                 Objective:   Physical Exam  02/23/2016   168 >  04/07/2016  168 > 05/08/2016    168 >  07/17/2016  162 > 09/18/2016    164  > 09/18/2017  160 > 12/28/2017 167  > 03/11/2018  164 > 03/20/2018   160 > 04/05/2018   171    Vital signs reviewed - Note on arrival 02 sats  100% on RA     amb hoarse bf with classic upper airway wheezing at rest heard across the room on  Exp completely resolves with plm/flutter valve     HEENT: nl dentition, turbinates bilaterally, and oropharynx. Nl external ear canals without cough reflex   NECK :  without JVD/Nodes/TM/ nl carotid upstrokes bilaterally   LUNGS: no acc muscle use,  Nl contour chest which is clear to A and P bilaterally without cough on insp or exp maneuvers   CV:  RRR  no s3 or murmur or increase in P2, and trace  edema both ankles  ABD:  pbese soft and nontender with nl inspiratory excursion in the supine position. No bruits or organomegaly appreciated, bowel sounds nl  MS:  Nl gait/ ext warm without deformities, calf tenderness, cyanosis or clubbing No obvious joint restrictions   SKIN: warm and dry without lesions    NEURO:  alert, approp, nl sensorium with  no motor or cerebellar deficits apparent.              I personally reviewed images and agree with radiology impression as follows:  CXR:   03/15/18 Mild chronic bronchitic reactive airway changes with scarring at the left lung base. No alveolar pneumonia nor CHF.

## 2018-04-05 NOTE — Telephone Encounter (Signed)
Left message for pt of Dr Marlou Porch orders and to c/b to schedule a f/u visit with him or Cecille Rubin.

## 2018-04-05 NOTE — Telephone Encounter (Signed)
Reviewed most current information with Dr Marlou Porch who gives verbal orders for pt to take Torsemide 20 mg (3) tablet BID for 3 days then return to normal dose or call if no improvement.

## 2018-04-06 ENCOUNTER — Encounter: Payer: Self-pay | Admitting: Internal Medicine

## 2018-04-06 NOTE — Assessment & Plan Note (Addendum)
-   FEN0 01/24/2016 = 13 with active symptoms on symb 160 2bid > changed to dulera 100  - Spirometry 02/23/2016  NO signifiant obstruction with active symptoms  - 02/23/2016  After extensive coaching HFA effectiveness =    75%  - FENO 02/23/2016  =  12   On dulera 100 2bid (unable to confirm adeherence as did not bring it - Singulair 10 mg daily added 02/23/2016 to dulera 100 2bid with 2 week sample only  (unable to verify she used it) - Allergy profile 02/23/16  >  Eos 0.0 /  IgE  87 pos  RAST  Grass/ trees  - 04/07/2016   symb 80 x one sample given - FENO 04/07/2016  =   43 ? Really on meds ?  - 05/08/2016  After extensive coaching HFA effectiveness =    75% > continue symb 80 2bid - 07/17/2016 added singulair maint   - FENO 09/18/2016  =   15 on symb 160 2bid > try 80 2bid  - flare late feb 2019 ? What maint rx she was taking but on admit "non-adherent per notes 08/12/17  - 09/18/2017  After extensive coaching inhaler device  effectiveness =    75% from a baseline of 50% > restart symb 80 2bid - FENO 09/18/2017  =   20 on symb 160  - Spirometry 09/18/2017  No obstruction at all with active wheeze 02/22/2018- bronchitis with asthma exac. Tx with augmentin and depo-medrol   No evidence of active asthma at this point but did go over her action plan using an ABCD format where, for refractory "flare" she can use pred x 6 days but at this point, given upper airway concerns, will try a lower dose of pulmocort maint rx = 0.25 mcg bid

## 2018-04-06 NOTE — Assessment & Plan Note (Addendum)
09/18/2016  Walked RA x 3 laps @ 185 ft each stopped due to  End of study, fast pace, min sob,  No  desat   - Echo 08/13/17 Left ventricle: The cavity size was normal. There was moderate   concentric hypertrophy. Systolic function was vigorous. The   estimated ejection fraction was in the range of 65% to 70%. Wall   motion was normal; there were no regional wall motion   abnormalities. Features are consistent with a pseudonormal left   ventricular filling pattern, with concomitant abnormal relaxation   and increased filling pressure (grade 2 diastolic dysfunction).   Doppler parameters are consistent with high ventricular filling   pressure. - Mitral valve: There was trivial regurgitation. - Pulmonary arteries: PA peak pressure: 34 mm Hg (S).  - 12/28/2017  Walked RA x 3 laps @ 185 ft each stopped due to  Sob but walked at nl  pace, no desat    Likely multifactorial:  diastolic dysfunction / obesity/ vcd / deconditioning   F/u cards for adjustments on bp meds/ diuretics planned and nothing more needed for now    >>> f/u in 2 weeks with full pfts

## 2018-04-08 ENCOUNTER — Telehealth: Payer: Self-pay | Admitting: *Deleted

## 2018-04-08 ENCOUNTER — Ambulatory Visit: Payer: Medicare Other | Admitting: Internal Medicine

## 2018-04-08 NOTE — Telephone Encounter (Signed)
ATC, Line busy x 2 WCB

## 2018-04-08 NOTE — Telephone Encounter (Signed)
Left another message for pt to c//b about medication change and scheduling f/u appt.

## 2018-04-08 NOTE — Telephone Encounter (Signed)
-----   Message from Tanda Rockers, MD sent at 04/06/2018  6:49 AM EST ----- Change her f/u to do ov with pfts same day

## 2018-04-08 NOTE — Telephone Encounter (Signed)
ATC, Line busy

## 2018-04-09 NOTE — Telephone Encounter (Signed)
Patient returned call, rescheduled OV for 04/18/18 with PFT.  No call back is required.

## 2018-04-09 NOTE — Telephone Encounter (Signed)
ATC, line busy- cell Left msg on her home number

## 2018-04-09 NOTE — Telephone Encounter (Signed)
Noted. Will close open encounter.

## 2018-04-12 ENCOUNTER — Ambulatory Visit: Payer: Medicare Other | Admitting: Internal Medicine

## 2018-04-17 ENCOUNTER — Other Ambulatory Visit: Payer: Self-pay | Admitting: Internal Medicine

## 2018-04-17 DIAGNOSIS — J4530 Mild persistent asthma, uncomplicated: Secondary | ICD-10-CM

## 2018-04-17 NOTE — Progress Notes (Unsigned)
pft  

## 2018-04-18 ENCOUNTER — Ambulatory Visit (INDEPENDENT_AMBULATORY_CARE_PROVIDER_SITE_OTHER): Payer: Medicare Other | Admitting: Internal Medicine

## 2018-04-18 ENCOUNTER — Encounter: Payer: Self-pay | Admitting: Internal Medicine

## 2018-04-18 ENCOUNTER — Telehealth (HOSPITAL_COMMUNITY): Payer: Self-pay

## 2018-04-18 VITALS — BP 124/60 | HR 67 | Ht 61.5 in | Wt 165.0 lb

## 2018-04-18 DIAGNOSIS — R05 Cough: Secondary | ICD-10-CM

## 2018-04-18 DIAGNOSIS — R058 Other specified cough: Secondary | ICD-10-CM

## 2018-04-18 DIAGNOSIS — J4530 Mild persistent asthma, uncomplicated: Secondary | ICD-10-CM | POA: Diagnosis not present

## 2018-04-18 LAB — PULMONARY FUNCTION TEST
DL/VA % pred: 147 %
DL/VA: 6.58 ml/min/mmHg/L
DLCO UNC: 11.21 ml/min/mmHg
DLCO cor % pred: 60 %
DLCO cor: 12.6 ml/min/mmHg
DLCO unc % pred: 53 %
FEF 25-75 PRE: 1.37 L/s
FEF 25-75 Post: 1.62 L/sec
FEF2575-%Change-Post: 17 %
FEF2575-%PRED-POST: 103 %
FEF2575-%Pred-Pre: 88 %
FEV1-%CHANGE-POST: 7 %
FEV1-%PRED-POST: 68 %
FEV1-%Pred-Pre: 64 %
FEV1-PRE: 1.05 L
FEV1-Post: 1.13 L
FEV1FVC-%Change-Post: 1 %
FEV1FVC-%Pred-Pre: 114 %
FEV6-%Change-Post: 5 %
FEV6-%PRED-POST: 61 %
FEV6-%PRED-PRE: 58 %
FEV6-POST: 1.25 L
FEV6-Pre: 1.19 L
FEV6FVC-%PRED-POST: 104 %
FEV6FVC-%PRED-PRE: 104 %
FVC-%CHANGE-POST: 5 %
FVC-%PRED-PRE: 55 %
FVC-%Pred-Post: 58 %
FVC-POST: 1.25 L
FVC-PRE: 1.19 L
POST FEV6/FVC RATIO: 100 %
Post FEV1/FVC ratio: 91 %
Pre FEV1/FVC ratio: 89 %
Pre FEV6/FVC Ratio: 100 %
RV % pred: 64 %
RV: 1.32 L
TLC % pred: 58 %
TLC: 2.73 L

## 2018-04-18 MED ORDER — TRAMADOL HCL 50 MG PO TABS: 50.0000 mg | ORAL_TABLET | ORAL | 2 refills | Status: AC | PRN

## 2018-04-18 NOTE — Telephone Encounter (Signed)
Called and spoke to pt in regards to Pulmonary Rehab, pt stated she is unable to participate due to her work schedule.  Closed referral

## 2018-04-18 NOTE — Progress Notes (Signed)
PFT done today. 

## 2018-04-18 NOTE — Patient Instructions (Signed)
Continue brovana and budesonide  Take the prednisone if getting worse  See [name] NP w/in 4  weeks with all your medications and pill boxes on a Tuesday , even over the counter meds, separated in two separate bags, the ones you take no matter what vs the ones you stop once you feel better and take only as needed when you feel you need them.   [name]  will generate for you a new user friendly medication calendar that will put [place] all on the same page re: your medication use.     Without this process, it simply isn't possible to assure that we are providing  your outpatient care  with  the attention to detail we feel you deserve.   If we cannot assure that you're getting that kind of care,  then we cannot manage your problem effectively from this clinic.  Once you have seen [name] and we are sure that we're all on the same page with your medication use she will arrange follow up with me.

## 2018-04-18 NOTE — Telephone Encounter (Signed)
Pt insurance is active and benefits verified through [facility]. Co-pay $0.00, DED $0.00/$0.00 met, out of pocket $3,200.00/$3,200.00 met, co-insurance 10%. No pre-authorization. Darrell/[employer], 04/18/18 @ 12:53PM, REF# [id number]

## 2018-04-18 NOTE — Progress Notes (Signed)
Subjective:   Patient ID: [name], female    DOB: 1950-11-25    MRN: [id number]    Brief patient profile:  13 yobf quit smoking 2005 with nl pfts 2012  with recurrent non-specific resp flares with nl pfts during flares c/w pseudoasthma.     History of Present Illness  01/24/2016  Extended post hosp f/u ov/ transition of care/ /[name] re: dtca asthma/ vcd symb 160 and pred 5 x 10 mg daily  Chief Complaint  Patient presents with  . Follow-up    Pt. recently got out the [facility] for an asthma attack, Pt. states her breathing remains the same, coughing with some yellow to white mucus,Has been needing to use Ventolin more often  last doing well 2 months prior to OV   While on Symb 160/ gerd rx but not dosing ac as rec  Waking up prematurely x 2 months with cough/wheeze/ sob but never contacted me as per the instructions rec Plan A = Automatic = dulera 100 Take 2 puffs first thing in am and then another 2 puffs about 12 hours later.                                      Nexum 40 mg Take 30-60 min before first meal of the day and Pepcid AC 20 mg  at bedtime Work on inhaler technique  Plan B = Backup Only use your albuterol as a rescue medication Plan C = Crisis - only use your albuterol nebulizer if you first try Plan B and it fails to help > ok to use the nebulizer up to every 4 hours but if start needing it regularly call for immediate appointment GERD diet  Take delsym two tsp every 12 hours and use the flutter valve as much as possible and supplement if needed with  tramadol 50 mg up to 2 every 4 hours to suppress the urge to cough  Once you have eliminated the cough for 3 straight days try reducing the tramadol first,  then the delsym as tolerated.   Prednisone 5 mg x 8, then 6, then 4 then 2, then 1 daily x 2 days and off  please schedule a follow up office visit in 2 weeks, sooner if needed with all meds/ inhalers/ neb solutions in hand     09/18/2016  f/u ov/[name] re:  UACS vs  Asthma brought med calendar but not updated / on symb 160 / max gerd rx  Chief Complaint  Patient presents with  . Follow-up    Pt c/o occ wheezing. She has not used rescue inhaler or neb recently.   still feels wheezing  And doe = MMRC1 = can walk nl pace, flat grade, can't hurry or go uphills or steps s sob rec Change the symbicort to 80 Take 2 puffs first thing in am and then another 2 puffs about 12 hours later.  Work on inhaler technique:  Use the med calendar daily to organize your medications as a "checklist" See [name] NP in 4  weeks with all your medications> never happened    Admit date: 12/19/2017 Discharge date: 12/24/2017  D/c home with steroids taper, monitor blood glucose while on steroids She reports run out of pulmicort nebs, new prescription provided, she is to follow up with Dr Melvyn Novas to further discuss asthma/copd meds, she is concerned about cost of these meds  Discharge Diagnoses:  Active Hospital Problems   Diagnosis Date Noted  . Acute bronchitis with asthma with acute exacerbation 01/16/2016  . Acute renal failure superimposed on stage 3 chronic kidney disease ([place]) 07/17/2016  . Chronic diastolic CHF (congestive heart failure) ([place]) 07/17/2016  . Acute respiratory failure with hypoxia ([place])   . Upper airway cough syndrome vs Asthma  08/28/2014  . Diabetes mellitus type 2, controlled ([place]) 08/06/2014  . Essential hypertension 07/21/2008           12/28/2017 ext post hosp f/u ov/[name] re: confused with meds again / only slightly better breathing  Chief Complaint  Patient presents with  . Follow-up    Breathing has improved slightly. She has been out of brovana due to high cost x 3 days. She has had to use her albuterol inhaler and neb daily since then.   Dyspnea:  50 ft = About the same as usual Cough: after supper is the worse, but not much mucus Sleeping: wakes up coughing 30 degrees due to sensation of reflux    SABA use: last 5 h prior to OV     02: none  Sleeping at 30 degrees    rec Plan A = Automatic = Performist 20 mcg one vial  with pulmocort one half vial twice daily  Gabapentin 100 mg three times daily Nexium Take 30- 60 min before your first and last meals of the day and zantac (ranitidine) at bedtime  Along with chlorpheniramine 4 mg 1 or 2 also at bedtime (over the counter)  Continue prednisone 10 mg every day  Plan B = Backup for breathing Only use your albuterol as a rescue medication  Plan B = Backup for coughing  Add tramadol 50 mg 1-2 every 4 hours if needed  Plan C = Crisis - only use your albuterol nebulizer if you first try Plan B and it fails to help > ok to use the nebulizer up to every 4 hours but if start needing it regularly call for immediate appointment     03/11/2018  f/u ov/[name] re: 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 Follow-up    Cough had improved and then started back 1 day ago.  Cough is now non prod.  She is using her albuterol inhaler 2 x daily on average. She has not needed albuterol neb.    Dyspnea:  MMRC2 = can't walk a nl pace on a flat grade s sob but does fine slow and flat slowed by knee  Cough: noct > day  Sleeping: 45 degrees due to subjective reflux despite max acid suppresion  SABA use: as above - never noct 02: none   rec Gabapentin 100 mg four  times daily  Take chortab 4 mg x 2 at bedtime and up to every 4 hours during the day as needed for drainage  Take delsym two tsp every 12 hours and supplement if needed with  tramadol 50 mg up to 2 every 4 hours to suppress the urge to cough. Swallowing water and/or using ice chips/non mint and menthol containing candies (such as lifesavers or sugarless jolly ranchers) are also effective.  You should rest your voice and avoid activities that you know make you cough. Once you have eliminated the cough for 3 straight days try reducing the tramadol first,  then the delsym as tolerated.  Plan D = Prednisone x 6  days if not cough/ sob not better See calendar for specific medication instructions and bring it back  for each and every office visit for every healthcare provider you see.  Without it,  you may not receive the best quality medical care that we feel you deserve. Please schedule a follow up office visit in 4 weeks, call sooner if needed with all medications /inhalers/ solutions in hand so we can verify exactly what you are taking. This includes all medications from all doctors and over the [place] separate them into two bags:  the ones you take automatically, no matter what, vs the ones you take just when you feel you need them "BAG #2 is UP TO YOU"  - this will really help [place] help you take your medications more effectively.    03/15/18 back to ER - did not activate action plan prior (never took more than 1 tramadol every 12 hours to control cough.   03/20/2018  Extended post hops  f/u ov/[name] re: transition of care re   vcd vs asthma  Chief Complaint  Patient presents with  . Follow-up    recent admission 03/15/18- pt reports of wheezing, sob with exertion, no prod cough & chest tightness.   Dyspnea:  MMRC4  = sob if tries to leave home or while getting dressed   - no better now vs during hosp stay. Seen by Dr [place] Bells who strongly supported vcd dx during admit Cough: dry cough settles down overnight and with plb as does the "wheeze"  Sleeping: 45 degrees SABA use: last neb 8 h prior to OV  "did not help" 02: no  rec Increase gabapentin to 100 mg take 2 (=200)  four times daily as per med calendar For cough > delsym 2 tsp every 12 hours with use of flutter valve as much as possible  and supplement with tramadol 50 mg up to 1-2 every 4 hours as needed  For breathing problems not improving with use of nebulizer > Prednisone 10 mg take  4 each am x 2 days,   2 each am x 2 days,  1 each am x 2 days and stop  GERD diet / lifestyle recs Keep previous appt with your medications in 2 bags     04/05/2018  f/u ov/[name] re: uacs/ VCD - brought all meds but confused with gen vs trade names, could not tell me how she is supposed to take esmeprazole when I handed it to her.  Chief Complaint  Patient presents with  . Follow-up  Dyspnea:  50 ft  Cough: better  Sleeping: 45 degrees SABA use: twice a day hfa , rare neb 02: none   rec Gabapentin 300 mg four times daily  Budesonide 0.25 mcg twice daily with performist If breathing worse > Prednisone 10 mg take  4 each am x 2 days,   2 each am x 2 days,  1 each am x 2 days and stop  For cough > Take delsym two tsp every 12 hours and flutter valve as much as possible supplement if needed with  tramadol 50 mg (or tylenol #3) up to 2 every 4 hours to suppress the urge to cough. Swallowing water and/or using ice chips/non mint and menthol containing candies (such as lifesavers or sugarless jolly ranchers) are also effective.  You should rest your voice and avoid activities that you know make you cough. Once you have eliminated the cough for 3 straight days try reducing the tramadol first,  then the delsym as tolerated. Nexium (esmaprazole)  Take 30- 60 min before your first and last  meals of the day  Please schedule a follow up office visit in 2  weeks, sooner if needed - needs full pfts on return      04/18/2018  f/u ov/[name] re: asthma vs vcd  - brought most but not all her meds - missing nexium, has budesonide in prn bag/ pill organizer has 2 slots per day and hard to read to top print on each slot Chief Complaint  Patient presents with  . Follow-up    PFT's done today. She c/o increased SOB for the past 3 days. She is using her albuterol inhaler and neb with albuterol both about 2 x per day.   Dyspnea:  Improved, able to do [employer] shopping now  Cough: varies s pattern, min mucoid  Sleeping: 45 degrees all pillows  SABA use: twice daily due to overdoing it at work 02: none     No obvious day to day or daytime variability or assoc  excess/ purulent sputum or mucus plugs or hemoptysis or cp or chest tightness, subjective wheeze or overt sinus or hb symptoms.   Sleeping head elevated as above  without nocturnal  or early am exacerbation  of respiratory  c/o's or need for noct saba. Also denies any obvious fluctuation of symptoms with weather or environmental changes or other aggravating or alleviating factors except as outlined above   No unusual exposure hx or h/o childhood pna/ asthma or knowledge of premature birth.  Current Allergies, Complete Past Medical History, Past Surgical History, Family History, and Social History were reviewed in [employer] record.  ROS  The following are not active complaints unless bolded Hoarseness, sore throat, dysphagia, dental problems, itching, sneezing,  nasal congestion or discharge of excess mucus or purulent secretions, ear ache,   fever, chills, sweats, unintended wt loss or wt gain, classically pleuritic or exertional cp,  orthopnea pnd or arm/hand swelling  or leg swelling better now, presyncope, palpitations, abdominal pain, anorexia, nausea, vomiting, diarrhea  or change in bowel habits or change in bladder habits, change in stools or change in urine, dysuria, hematuria,  rash, arthralgias, visual complaints, headache, numbness, weakness or ataxia or problems with walking or coordination,  change in mood or  memory.        Current Meds  Medication Sig  . acetaminophen-codeine (TYLENOL #3) 300-30 MG tablet Take 1 tablet by mouth every 6 (six) hours as needed. for pain  . albuterol (PROAIR HFA) 108 (90 Base) MCG/ACT inhaler Inhale 2 puffs into the lungs every 4 (four) hours as needed for wheezing or shortness of breath.  Marland Kitchen albuterol (PROVENTIL) (2.5 MG/3ML) 0.083% nebulizer solution Take 2.5 mg by nebulization every 4 (four) hours as needed for wheezing or shortness of breath (((PLAN C))).  Marland Kitchen ALPRAZolam (XANAX) 0.5 MG tablet Take 1 tablet (0.5 mg total) by mouth  2 (two) times daily as needed for anxiety.  Marland Kitchen aspirin EC 81 MG tablet Take 81 mg by mouth daily.   Marland Kitchen atorvastatin (LIPITOR) 40 MG tablet Take 40 mg by mouth at bedtime.  . Biotin 5000 MCG CAPS Take 1 capsule by mouth daily.  . budesonide (PULMICORT) 0.25 MG/2ML nebulizer solution One vial twice daily  . dextromethorphan (DELSYM) 30 MG/5ML liquid 2 tsp every 12 hours as needed w/ flutter  . diltiazem (CARDIZEM CD) 240 MG 24 hr capsule Take 1 capsule (240 mg total) by mouth daily.  . divalproex (DEPAKOTE ER) 500 MG 24 hr tablet Take 2 tablets (1,000 mg total) by mouth  at bedtime.  Marland Kitchen esomeprazole (NEXIUM) 40 MG capsule Take 1 capsule (40 mg total) by mouth 2 (two) times daily before a meal. (Patient taking differently: Take 40 mg by mouth daily. )  . estradiol (ESTRACE) 1 MG tablet Take 1 mg by mouth daily.   . famotidine (PEPCID) 20 MG tablet Take 20 mg by mouth at bedtime.  . fluticasone (FLONASE) 50 MCG/ACT nasal spray Place 2 sprays into both nostrils daily as needed for allergies or rhinitis.  . formoterol (PERFOROMIST) 20 MCG/2ML nebulizer solution Take 2 mLs (20 mcg total) by nebulization 2 (two) times daily. J41.0  . gabapentin (NEURONTIN) 300 MG capsule Take 1 capsule (300 mg total) by mouth 4 (four) times daily.  . hydrALAZINE (APRESOLINE) 25 MG tablet Take 1 tablet (25 mg total) by mouth 2 (two) times daily.  . metFORMIN (GLUCOPHAGE) 500 MG tablet Take 500 mg by mouth 2 (two) times daily with a meal.  . nebivolol (BYSTOLIC) 2.5 MG tablet Take 2.5 mg by mouth daily.  . nitroGLYCERIN (NITROSTAT) 0.4 MG SL tablet Place 1 tablet (0.4 mg total) under the tongue every 5 (five) minutes as needed for chest pain. x3 as needed  . potassium chloride SA (K-DUR,KLOR-CON) 20 MEQ tablet Take 20 mEq by mouth daily.   Marland Kitchen torsemide (DEMADEX) 20 MG tablet Take 20-60 mg by mouth daily. 1-3 tabs (20mg  -60mg ) by mouth once daily  . traMADol (ULTRAM) 50 MG tablet TAKE 1 TABLET BY MOUTH EVERY 4 HOURS AS NEEDED  FOR UP TO 5 DAYS (FOR COUGH OR PAIN)                 Objective:   Physical Exam  02/23/2016   168 >  04/07/2016  168 > 05/08/2016    168 >  07/17/2016  162 > 09/18/2016    164  > 09/18/2017  160 > 12/28/2017 167  > 03/11/2018  164 > 03/20/2018   160 > 04/05/2018   171 > 04/18/2018  165    Vital signs reviewed - Note on arrival 02 sats  100% on RA      amb bf, cough is rattly than harsh  HEENT: nl dentition, turbinates bilaterally, and oropharynx. Nl external ear canals without cough reflex   NECK :  without JVD/Nodes/TM/ nl carotid upstrokes bilaterally   LUNGS: no acc muscle use,  Nl contour chest which is clear to A and P bilaterally without cough on insp or exp maneuvers   CV:  RRR  no s3 or murmur or increase in P2, and no edema   ABD:  soft and nontender with nl inspiratory excursion in the supine position. No bruits or organomegaly appreciated, bowel sounds nl  MS:  Nl gait/ ext warm without deformities, calf tenderness, cyanosis or clubbing No obvious joint restrictions   SKIN: warm and dry without lesions    NEURO:  alert, approp, nl sensorium with  no motor or cerebellar deficits apparent.

## 2018-04-19 ENCOUNTER — Encounter: Payer: Self-pay | Admitting: Internal Medicine

## 2018-04-19 ENCOUNTER — Ambulatory Visit: Payer: Medicare Other | Admitting: Internal Medicine

## 2018-04-19 NOTE — Assessment & Plan Note (Signed)
-   CT sinus 08/06/14 > neg  - Allergy profile 02/23/16  >  Eos 0.0 /  IgE  87 pos  RAST  Grass/ trees  - improved 05/08/2016 on gabapentin 300 tid > no change rx > d/c'd 07/2016 DgEs 10/09/17  Low range of penetration without aspiration. Otherwise, normal - Asthma exac 12/17/17- FENO 18. Significant upper airway wheeze, tx pred taper. Add neurontin 100mg  TID.  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 > rec max  gerd rx   - 04/05/2018 pt did not recognize generic bottle of nexium or how it was supposed to be taken  - 04/05/2018 rechallenge with gabapentin 300  Qid  - PFT's  04/18/2018  FEV1 1.13 (68 % ) ratio 91  p 7 % improvement from saba p nothing prior to study with DLCO  53/60 % corrects to 147  % for alv volume s curvature.    Improved and clearly no active asthma or abn of f/v loop to suggest any fixed mechanical obstruction c/w VCD as suspected.   She's still thoroughly confused and disorganized with her meds so rec return for formal medication reconciliation:    To keep things simple, I have asked the patient to first separate medicines that are perceived as maintenance, that is to be taken daily "no matter what", from those medicines that are taken on only on an as-needed basis and I have given the patient examples of both, and then return to see our NP to generate a  detailed  medication calendar which should be followed until the next physician sees the patient and updates it.

## 2018-04-19 NOTE — Assessment & Plan Note (Signed)
-   CT sinus 08/06/14 > neg  - Allergy profile 02/23/16  >  Eos 0.0 /  IgE  87 pos  RAST  Grass/ trees  - improved 05/08/2016 on gabapentin 300 tid > no change rx > d/c'd 07/2016 DgEs 10/09/17  Low range of penetration without aspiration. Otherwise, normal - Asthma exac 12/17/17- FENO 18. Significant upper airway wheeze, tx pred taper. Add neurontin 100mg  TID.  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 > rec max  gerd rx     - 04/05/2018 pt did not recognize generic bottle of nexium or how it was supposed to be taken  - 04/05/2018 rechallenge with gabapentin 300 tid   Very dissappointing that she doesn't recognize the most important medication she is supposed to be on just because it is written as generic,  Esp when we list it both ways in her AVS, but did the best I could to work with her today on this and all other maint vs prns.  > 50 % of 40 min ov spent on counseling/medication reconciliation and teaching approp use of flutter valve

## 2018-05-16 NOTE — Progress Notes (Signed)
[facility]  PATIENT: [name] [name] DOB: Nov 23, 1950   REASON FOR VISIT: Follow-up for seizure disorder HISTORY FROM: Patient  HISTORY OF PRESENT ILLNESS:[name] [name] is a 68 years old left-handed female, seen in refer by her primary care doctor [name] [name] for evaluation of episodes of confusion on January 27 2016, I reviewed and summarized the referring note, she had a history of type 2 diabetes, hypertension, hyperlipidemia, pulmonary emboli and, depression, COPD, coronary artery disease, stent placement following a heart attack in January 18 2016, I reviewed echo report on January 18 2016, ejection fraction 65-70%, severe concentric hypertrophy, septal wall thickness was increased, with mild hypertrophy of the posterior wall, abnormal relaxation, increased filling pressure, I also reviewed laboratory evaluation BMP showed elevated glucose 270, creatinine 1.06, cholesterol 207, LDL 110, A1c 7.0, She had her first generalized seizure in 07/25/09, was taken to [place] evaluated by neurologist there, per patient, there was a suggestion of antiepileptic medications, however it was never followed through.  Over the years, she had no generalized seizure, but every few weeks to every few months, she would have episodes of sudden onset confusion, space out, lasting less than 5 minutes  She understands through extreme stress (her grandson died) in 2015-05-28, since then, she began to have increased spells, almost daily basis now, she noted when she was reading, cooking, has transient time elapsed, she came to confused, sometimes preceded by nausea dry mouth sensation. She denies family history of seizure, she works as a Midwife at [employer].  UPDATE May 11 2016:[name] She is now taking Depakote ER 500 mg every night, which has helped her tremendously, she no longer has recurrent spells, EEG was normal in October 2017 We have personally  reviewed MRI of the brain in November 2017, multiple periventricular and subcortical punctate scattered chronic small vessel disease, increased T2 flare hyperintensity in the mesial temporal lobe, right hippocampus is slightly smaller than the left, UPDATE 11/14/16 [name] [name],68 year old female returns for followup. She has a history of generalized seizure in 07-25-09 and then episodes of acute confusion lasting about 5 minutes which became more frequent after her grandson died 2015/07/26 . She was on Depakote when last seen by Dr. Krista Blue however she stopped the medication a couple months ago. She was encouraged to get back on medication and we discussed seizure triggers etc. She returns for reevaluation. She continues to work full-time UPDATE 1/17/2019[name] [name], 68 year old female returns for follow-up with history of generalized seizure in July 25, 2009 and additional episodes of acute confusion lasting about 5 minutes that became more frequent after her grandson died in 2015-07-26.  She restarted her Depakote after her last visit and claims she is compliant.  She has not had further seizure activity.  She continues to work full-time.  She is independent in all ADL drives without difficulty.  She returns for reevaluation UPDATE 1/17/2020[name] [name], 68 year old female returns for follow-up with history of generalized seizure disorder last seizure occurring in 07/25/2009.  She has had additional episodes of acute confusion lasting about 5 minutes but became more frequent after her's grandson died in 2015/07/26.  She restarted her Depakote after that time claims she is compliant without further seizure activity.  She denies side effects to the drug.  She continues to work part-time 4 hours a day.  She remains independent in all activities of daily living drives without difficulty.  She had hospital admission for asthma in November.  She  periodically gets injections to her knees.  She had a recent sleep study ordered by primary care.  She is not  sure if she has sleep apnea or not.  She returns for reevaluation   REVIEW OF SYSTEMS: Full 14 system review of systems performed and notable only for those listed, all others are neg:  Constitutional: neg  Cardiovascular: neg Ear/Nose/Throat: neg  Skin: neg Eyes: neg Respiratory: neg Gastroitestinal: neg  Hematology/Lymphatic: neg Endocrine: neg Musculoskeletal:neg Allergy/Immunology: neg Neurological: Seizure disorder Psychiatric:  anxiety Sleep : neg   ALLERGIES: No Known Allergies  HOME MEDICATIONS: Outpatient Medications Prior to Visit  Medication Sig Dispense Refill  . albuterol (PROAIR HFA) 108 (90 Base) MCG/ACT inhaler Inhale 2 puffs into the lungs every 4 (four) hours as needed for wheezing or shortness of breath. 1 Inhaler 5  . albuterol (PROVENTIL) (2.5 MG/3ML) 0.083% nebulizer solution Take 2.5 mg by nebulization every 4 (four) hours as needed for wheezing or shortness of breath (((PLAN C))).    Marland Kitchen ALPRAZolam (XANAX) 0.5 MG tablet Take 1 tablet (0.5 mg total) by mouth 2 (two) times daily as needed for anxiety. 30 tablet 0  . aspirin EC 81 MG tablet Take 81 mg by mouth daily.     Marland Kitchen atorvastatin (LIPITOR) 40 MG tablet Take 40 mg by mouth at bedtime.    . Biotin 5000 MCG CAPS Take 1 capsule by mouth daily.    . budesonide (PULMICORT) 0.25 MG/2ML nebulizer solution One vial twice daily 120 mL 12  . dextromethorphan (DELSYM) 30 MG/5ML liquid 2 tsp every 12 hours as needed w/ flutter    . diltiazem (CARDIZEM CD) 240 MG 24 hr capsule Take 1 capsule (240 mg total) by mouth daily. 90 capsule 3  . divalproex (DEPAKOTE ER) 500 MG 24 hr tablet Take 2 tablets (1,000 mg total) by mouth at bedtime. 60 tablet 11  . esomeprazole (NEXIUM) 40 MG capsule Take 1 capsule (40 mg total) by mouth 2 (two) times daily before a meal. (Patient taking differently: Take 40 mg by mouth daily. ) 60 capsule 3  . famotidine (PEPCID) 20 MG tablet Take 20 mg by mouth at bedtime.    . fluticasone  (FLONASE) 50 MCG/ACT nasal spray Place 2 sprays into both nostrils daily as needed for allergies or rhinitis.    . formoterol (PERFOROMIST) 20 MCG/2ML nebulizer solution Take 2 mLs (20 mcg total) by nebulization 2 (two) times daily. J41.0 250 mL 0  . gabapentin (NEURONTIN) 300 MG capsule Take 1 capsule (300 mg total) by mouth 4 (four) times daily. 120 capsule 2  . hydrALAZINE (APRESOLINE) 25 MG tablet Take 1 tablet (25 mg total) by mouth 2 (two) times daily. 180 tablet 3  . metFORMIN (GLUCOPHAGE) 500 MG tablet Take 500 mg by mouth 2 (two) times daily with a meal.    . nebivolol (BYSTOLIC) 2.5 MG tablet Take 2.5 mg by mouth daily.    . nitroGLYCERIN (NITROSTAT) 0.4 MG SL tablet Place 1 tablet (0.4 mg total) under the tongue every 5 (five) minutes as needed for chest pain. x3 as needed 25 tablet 6  . potassium chloride SA (K-DUR,KLOR-CON) 20 MEQ tablet Take 20 mEq by mouth daily.     Marland Kitchen torsemide (DEMADEX) 20 MG tablet Take 20-60 mg by mouth daily. 1-3 tabs (20mg  -60mg ) by mouth once daily    . traMADol (ULTRAM) 50 MG tablet TAKE 1 TABLET BY MOUTH EVERY 4 HOURS AS NEEDED FOR UP TO 5 DAYS (FOR COUGH OR PAIN)  0  . estradiol (ESTRACE) 1 MG tablet Take 1 mg by mouth daily.     . predniSONE (DELTASONE) 10 MG tablet Take  4 each am x 2 days,   2 each am x 2 days,  1 each am x 2 days and stop (Patient not taking: Reported on 04/18/2018) 14 tablet 11  . acetaminophen-codeine (TYLENOL #3) 300-30 MG tablet Take 1 tablet by mouth every 6 (six) hours as needed. for pain  0   No facility-administered medications prior to visit.     PAST MEDICAL HISTORY: Past Medical History:  Diagnosis Date  . Anxiety   . Asthma   . Bronchitis   . Chronic diastolic CHF (congestive heart failure) ([place])   . Chronic diastolic CHF (congestive heart failure) ([place]) 07/17/2016  . Coronary artery disease    a. NSTEMI 12/2015 - LHC 01/18/16: s/p overlapping DESx2 to RCA, 10% ost-prox Cx,10% mLAD.  Marland Kitchen Depression   . Diabetes mellitus    . Dyslipidemia   . GERD (gastroesophageal reflux disease)   . Heart attack ([place])   . Hypertension   . Hypertensive heart disease   . NSTEMI (non-ST elevated myocardial infarction) ([place]) 12/2015  . Pneumonia     PAST SURGICAL HISTORY: Past Surgical History:  Procedure Laterality Date  . ABDOMINAL HYSTERECTOMY    . CARDIAC CATHETERIZATION  1994   minimal LAD dz, no other dz, EF normal  . CARDIAC CATHETERIZATION N/A 01/18/2016   Procedure: Left Heart Cath and Coronary Angiography;  Surgeon: Burnell Blanks, MD;  Location: [facility];  Service: Cardiovascular;  Laterality: N/A;  . CARDIAC CATHETERIZATION N/A 01/18/2016   Procedure: Coronary Stent Intervention;  Surgeon: Burnell Blanks, MD;  Location: [facility];  Service: Cardiovascular;  Laterality: N/A;  . COLONOSCOPY WITH PROPOFOL N/A 04/09/2014   Procedure: COLONOSCOPY WITH PROPOFOL;  Surgeon: Cleotis Nipper, MD;  Location: [facility];  Service: Endoscopy;  Laterality: N/A;  . CORONARY STENT PLACEMENT  01/19/2016   STENT SYNERGY DES [id number] drug eluting stent was successfully placed, and overlaps the 2.5 x 38 mm Synergy stent placed distally.  . ESOPHAGOGASTRODUODENOSCOPY (EGD) WITH PROPOFOL N/A 04/09/2014   Procedure: ESOPHAGOGASTRODUODENOSCOPY (EGD) WITH PROPOFOL;  Surgeon: Cleotis Nipper, MD;  Location: [facility];  Service: Endoscopy;  Laterality: N/A;  . HEMORRHOID SURGERY    . HERNIA REPAIR    . KNEE ARTHROSCOPY      FAMILY HISTORY: Family History  Problem Relation Age of Onset  . Allergies Mother   . Asthma Mother   . CAD Father 51  . Diabetes Father   . Hypertension Father   . Congestive Heart Failure Sister        died in her 21s  . CAD Sister 96    SOCIAL HISTORY: Social History   Socioeconomic History  . Marital status: Married    Spouse name: Not on file  . Number of children: 3  . Years of education: some college  . Highest education level: Not on file    Occupational History  . Occupation: Education officer, community  . Financial resource strain: Not on file  . Food insecurity:    Worry: Not on file    Inability: Not on file  . Transportation needs:    Medical: Not on file    Non-medical: Not on file  Tobacco Use  . Smoking status: Former Smoker    Packs/day: 1.00    Years: 30.00    Pack years: 30.00  Types: Cigarettes    Last attempt to quit: 01/09/2004    Years since quitting: 14.3  . Smokeless tobacco: Never Used  Substance and Sexual Activity  . Alcohol use: No    Alcohol/week: 0.0 standard drinks  . Drug use: No  . Sexual activity: Not on file  Lifestyle  . Physical activity:    Days per week: Not on file    Minutes per session: Not on file  . Stress: Not on file  Relationships  . Social connections:    Talks on phone: Not on file    Gets together: Not on file    Attends religious service: Not on file    Active member of club or organization: Not on file    Attends meetings of clubs or organizations: Not on file    Relationship status: Not on file  . Intimate partner violence:    Fear of current or ex partner: Not on file    Emotionally abused: Not on file    Physically abused: Not on file    Forced sexual activity: Not on file  Other Topics Concern  . Not on file  Social History Narrative   Pt lives with husband in [place].   Right-handed.   2 cups caffeine per day.     PHYSICAL EXAM  Vitals:   05/17/18 0945  BP: (!) 145/81  Pulse: 76  Weight: 159 lb (72.1 kg)  Height: 5\' 2"  (1.575 m)   Body mass index is 29.08 kg/m.  Generalized: Well developed, in no acute distress  Head: normocephalic and atraumatic,. Oropharynx benign  Neck: Supple,   Musculoskeletal: No deformity   Neurological examination   Mentation: Alert oriented to time, place, history taking. Attention span and concentration appropriate. Recent and remote memory intact.  Follows all commands speech and language fluent.    Cranial nerve II-XII: Pupils were equal round reactive to light extraocular movements were full, visual field were full on confrontational test. Facial sensation and strength were normal. hearing was intact to finger rubbing bilaterally. Uvula tongue midline. head turning and shoulder shrug were normal and symmetric.Tongue protrusion into cheek strength was normal. Motor: normal bulk and tone, full strength in the BUE, BLE,   Sensory: normal and symmetric to light touch, on the face arms and legs Coordination: finger-nose-finger, heel-to-shin bilaterally, no dysmetria Reflexes: Symmetric upper and lower plantar responses were flexor bilaterally. Gait and Station: Rising up from seated position without assistance, normal stance,  moderate stride, good arm swing, smooth turning, able to perform tiptoe, and heel walking without difficulty. Tandem gait is steady.   DIAGNOSTIC DATA (LABS, IMAGING, TESTING) - I reviewed patient records, labs, notes, testing and imaging myself where available.  Lab Results  Component Value Date   WBC 12.5 (H) 03/16/2018   HGB 10.3 (L) 03/16/2018   HCT 33.4 (L) 03/16/2018   MCV 98.2 03/16/2018   PLT 299 03/16/2018      Component Value Date/Time   NA 135 03/18/2018 0422   NA 142 02/22/2018 1103   K 4.3 03/18/2018 0422   CL 100 03/18/2018 0422   CO2 28 03/18/2018 0422   GLUCOSE 156 (H) 03/18/2018 0422   BUN 31 (H) 03/18/2018 0422   BUN 15 02/22/2018 1103   CREATININE 1.22 (H) 03/18/2018 0422   CREATININE 1.06 (H) 01/26/2016 1608   CALCIUM 8.5 (L) 03/18/2018 0422   PROT 7.1 03/15/2018 1042   PROT 7.6 05/17/2017 1141   ALBUMIN 3.6 03/15/2018 1042   ALBUMIN 4.3 05/17/2017  1141   AST 18 03/15/2018 1042   ALT 15 03/15/2018 1042   ALKPHOS 48 03/15/2018 1042   BILITOT 0.6 03/15/2018 1042   BILITOT 0.4 05/17/2017 1141   GFRNONAA 44 (L) 03/18/2018 0422   GFRAA 51 (L) 03/18/2018 0422   Lab Results  Component Value Date   CHOL 196 07/18/2016   HDL 48  07/18/2016   LDLCALC 107 (H) 07/18/2016   TRIG 203 (H) 07/18/2016   CHOLHDL 4.1 07/18/2016   Lab Results  Component Value Date   HGBA1C 7.3 (H) 10/06/2017       ASSESSMENT AND PLAN  68 y.o. year old female  has a past medical history of Anxiety; Asthma;  Chronic diastolic CHF (congestive heart failure) ([place]);  Coronary artery disease; Depression; Diabetes mellitus; Dyslipidemia;  Hypertensive heart disease; NSTEMI (non-ST elevated myocardial infarction) ([place]) (12/2015); and Seizure disorder complex partial here to follow-up for her seizure disorder.   PLAN: Continue  Depakote 500mg  ER 2 caps daily will refill after labs back Reviewed  CBC and CMP from hospital adm 03/15/18 Valproic acid level to monitor for therapeutic level Call for any seizure activity Follow-up yearly and as needed Dennie Bible, [facility], [facility], [facility] [address] [place], [place] [address] [phone]

## 2018-05-17 ENCOUNTER — Ambulatory Visit: Payer: Medicare Other | Admitting: Nurse Practitioner

## 2018-05-17 ENCOUNTER — Encounter: Payer: Self-pay | Admitting: Nurse Practitioner

## 2018-05-17 VITALS — BP 145/81 | HR 76 | Ht 62.0 in | Wt 159.0 lb

## 2018-05-17 DIAGNOSIS — R569 Unspecified convulsions: Secondary | ICD-10-CM

## 2018-05-17 DIAGNOSIS — Z5181 Encounter for therapeutic drug level monitoring: Secondary | ICD-10-CM | POA: Diagnosis not present

## 2018-05-17 NOTE — Progress Notes (Signed)
I have reviewed and agreed above plan. 

## 2018-05-17 NOTE — Patient Instructions (Signed)
Continue  Depakote 500mg  ER daily will refill after labs back Reviewed  CBC and CMP from hospital adm 03/15/18 Valproic acid level to monitor for therapeutic level Call for any seizure activity Follow-up yearly and as needed

## 2018-05-18 LAB — VALPROIC ACID LEVEL: Valproic Acid Lvl: 45 ug/mL — ABNORMAL LOW (ref 50–100)

## 2018-05-20 ENCOUNTER — Encounter: Payer: Self-pay | Admitting: *Deleted

## 2018-05-20 ENCOUNTER — Encounter: Payer: Medicare Other | Admitting: Adult Health

## 2018-05-20 ENCOUNTER — Other Ambulatory Visit: Payer: Self-pay | Admitting: Nurse Practitioner

## 2018-05-20 MED ORDER — DIVALPROEX SODIUM ER 500 MG PO TB24: 1000.0000 mg | ORAL_TABLET | Freq: Every day | ORAL | 11 refills | Status: DC

## 2018-05-27 ENCOUNTER — Encounter: Payer: Self-pay | Admitting: Adult Health

## 2018-05-27 ENCOUNTER — Ambulatory Visit: Payer: Medicare Other | Admitting: Adult Health

## 2018-05-27 DIAGNOSIS — R058 Other specified cough: Secondary | ICD-10-CM

## 2018-05-27 DIAGNOSIS — K219 Gastro-esophageal reflux disease without esophagitis: Secondary | ICD-10-CM | POA: Diagnosis not present

## 2018-05-27 DIAGNOSIS — R05 Cough: Secondary | ICD-10-CM | POA: Diagnosis not present

## 2018-05-27 DIAGNOSIS — J4530 Mild persistent asthma, uncomplicated: Secondary | ICD-10-CM | POA: Diagnosis not present

## 2018-05-27 NOTE — Assessment & Plan Note (Signed)
Doing well on current regimen.  Encouraged her on budesonide and Perforomist compliance.  Patient's medications were reviewed today and patient education was given. Computerized medication calendar was adjusted/completed   Plan  Patient Instructions  Continue on Budesonide and Perforomist Neb Twice daily  , rinse after use.  Decrease Gabapentin 300mg  At bedtime  .  Follow med calendar closely and bring to each visit  Follow up Dr. Melvyn Novas  Or Parrett NP In 2-3  month and As needed   Please contact office for sooner follow up if symptoms do not improve or worsen or seek emergency care

## 2018-05-27 NOTE — Patient Instructions (Addendum)
Continue on Budesonide and Perforomist Neb Twice daily  , rinse after use.  Decrease Gabapentin 300mg  At bedtime  .  Follow med calendar closely and bring to each visit  Follow up Dr. Melvyn Novas  Or Chablis Losh NP In 2-3  month and As needed   Please contact office for sooner follow up if symptoms do not improve or worsen or seek emergency care

## 2018-05-27 NOTE — Assessment & Plan Note (Signed)
Cont on GERD rx and diet

## 2018-05-27 NOTE — Assessment & Plan Note (Signed)
Chronic cough is improved on current regimen.  Will continue on decreasing gabapentin as able.  Decrease to gabapentin 300 mg at bedtime.  Continue on cough control regimen  Plan  Patient Instructions  Continue on Budesonide and Perforomist Neb Twice daily  , rinse after use.  Decrease Gabapentin 300mg  At bedtime  .  Follow med calendar closely and bring to each visit  Follow up Dr. Melvyn Novas  Or Parrett NP In 2-3  month and As needed   Please contact office for sooner follow up if symptoms do not improve or worsen or seek emergency care

## 2018-05-27 NOTE — Progress Notes (Signed)
@Patient  ID: [name], female    DOB: 26-Jan-1951, 68 y.o.   MRN: [id number]  Chief Complaint  Patient presents with  . Follow-up    Asthma     Referring provider: [name], MD  HPI: [id number] former smoker followed for Asthmaand chronic cough PMH + CAD, DM , D CHF, seizure history  TEST  FEN0 01/24/2016= 13 with active symptoms on symb 160 2bid >changed to dulera 100  - Spirometry 02/23/2016 NO signifiant obstruction with active symptoms  - FENO 02/23/2016= 12 On dulera 100 2bid (unable to confirm adeherence as did not bring it - Singulair 10 mg daily added 10/25/2017to dulera 100 2bid with 2 week sample only (unable to verify she used it) - FENO 04/07/2016= 43 -CTa CHest 2017 >neg PE . 4 mm RUL nodule  CT sinus 2016 neg IgE 87 2017,positive Rast grass, dust, tree High-resolution CT chest April 2018--NEGfor ILD, stable 4 mm right upper lobe nodule. Echo 07/2017 EF 65%,Gr 2 DD , PAP 47mmHg  Spirometry 08/2017 Moderate restriction , no obstruction .   05/27/2018 Follow up : Asthma , Cough , Med Review  Patient returns for a one-month follow-up.  Patient has underlying asthma and chronic cough. Patient says since last visit she has been doing very well.  Cough and wheezing are much improved.  She feels that her breathing is at baseline.  Remains  on budesonide and Perforomist twice daily.  Patient does tell me that she has not been taking this on a regular basis.  As she has been feeling much better. She also has had her gabapentin decreased to twice a day.  As it was causing increased sedation.  She has not noticed a flare of cough since doing this. Patient says she is active and works part-time.   We reviewed all her medications and organized  them into a medication calendar with patient education.    No Known Allergies  Immunization History  Administered Date(s) Administered  . Influenza Split 03/10/2012, 03/14/2015  . Influenza, High Dose  Seasonal PF 02/04/2017, 01/04/2018  . Influenza-Unspecified 01/11/2016  . Zoster Recombinat (Shingrix) 06/11/2017, 08/28/2017, 09/27/2017    Past Medical History:  Diagnosis Date  . Anxiety   . Asthma   . Bronchitis   . Chronic diastolic CHF (congestive heart failure) ([place])   . Chronic diastolic CHF (congestive heart failure) ([place]) 07/17/2016  . Coronary artery disease    a. NSTEMI 12/2015 - LHC 01/18/16: s/p overlapping DESx2 to RCA, 10% ost-prox Cx,10% mLAD.  Marland Kitchen Depression   . Diabetes mellitus   . Dyslipidemia   . GERD (gastroesophageal reflux disease)   . Heart attack ([place])   . Hypertension   . Hypertensive heart disease   . NSTEMI (non-ST elevated myocardial infarction) ([place]) 12/2015  . Pneumonia     Tobacco History: Social History   Tobacco Use  Smoking Status Former Smoker  . Packs/day: 1.00  . Years: 30.00  . Pack years: 30.00  . Types: Cigarettes  . Last attempt to quit: 01/09/2004  . Years since quitting: 14.3  Smokeless Tobacco Never Used   Counseling given: Not Answered   Outpatient Medications Prior to Visit  Medication Sig Dispense Refill  . gabapentin (NEURONTIN) 300 MG capsule Take 300 mg by mouth at bedtime.    Marland Kitchen albuterol (PROAIR HFA) 108 (90 Base) MCG/ACT inhaler Inhale 2 puffs into the lungs every 4 (four) hours as needed for wheezing or shortness of breath. 1 Inhaler 5  . albuterol (PROVENTIL) (  2.5 MG/3ML) 0.083% nebulizer solution Take 2.5 mg by nebulization every 4 (four) hours as needed for wheezing or shortness of breath (((PLAN C))).    Marland Kitchen ALPRAZolam (XANAX) 0.5 MG tablet Take 1 tablet (0.5 mg total) by mouth 2 (two) times daily as needed for anxiety. 30 tablet 0  . aspirin EC 81 MG tablet Take 81 mg by mouth daily.     Marland Kitchen atorvastatin (LIPITOR) 40 MG tablet Take 40 mg by mouth at bedtime.    . Biotin 5000 MCG CAPS Take 1 capsule by mouth daily.    . budesonide (PULMICORT) 0.25 MG/2ML nebulizer solution One vial twice daily 120 mL 12  .  dextromethorphan (DELSYM) 30 MG/5ML liquid 2 tsp every 12 hours as needed w/ flutter    . diltiazem (CARDIZEM CD) 240 MG 24 hr capsule Take 1 capsule (240 mg total) by mouth daily. 90 capsule 3  . divalproex (DEPAKOTE ER) 500 MG 24 hr tablet Take 2 tablets (1,000 mg total) by mouth at bedtime. 60 tablet 11  . esomeprazole (NEXIUM) 40 MG capsule Take 1 capsule (40 mg total) by mouth 2 (two) times daily before a meal. (Patient taking differently: Take 40 mg by mouth daily. ) 60 capsule 3  . estradiol (ESTRACE) 1 MG tablet Take 1 mg by mouth daily.     . famotidine (PEPCID) 20 MG tablet Take 20 mg by mouth at bedtime.    . fluticasone (FLONASE) 50 MCG/ACT nasal spray Place 2 sprays into both nostrils daily as needed for allergies or rhinitis.    . formoterol (PERFOROMIST) 20 MCG/2ML nebulizer solution Take 2 mLs (20 mcg total) by nebulization 2 (two) times daily. J41.0 250 mL 0  . hydrALAZINE (APRESOLINE) 25 MG tablet Take 1 tablet (25 mg total) by mouth 2 (two) times daily. 180 tablet 3  . metFORMIN (GLUCOPHAGE) 500 MG tablet Take 500 mg by mouth 2 (two) times daily with a meal.    . nebivolol (BYSTOLIC) 2.5 MG tablet Take 2.5 mg by mouth daily.    . nitroGLYCERIN (NITROSTAT) 0.4 MG SL tablet Place 1 tablet (0.4 mg total) under the tongue every 5 (five) minutes as needed for chest pain. x3 as needed 25 tablet 6  . potassium chloride SA (K-DUR,KLOR-CON) 20 MEQ tablet Take 20 mEq by mouth daily.     . predniSONE (DELTASONE) 10 MG tablet Take  4 each am x 2 days,   2 each am x 2 days,  1 each am x 2 days and stop (Patient not taking: Reported on 04/18/2018) 14 tablet 11  . torsemide (DEMADEX) 20 MG tablet Take 20-60 mg by mouth daily. 1-3 tabs (20mg  -60mg ) by mouth once daily    . gabapentin (NEURONTIN) 300 MG capsule Take 1 capsule (300 mg total) by mouth 4 (four) times daily. 120 capsule 2  . traMADol (ULTRAM) 50 MG tablet TAKE 1 TABLET BY MOUTH EVERY 4 HOURS AS NEEDED FOR UP TO 5 DAYS (FOR COUGH OR  PAIN)  0   No facility-administered medications prior to visit.      Review of Systems:   Constitutional:   No  weight loss, night sweats,  Fevers, chills, fatigue, or  lassitude.  HEENT:   No headaches,  Difficulty swallowing,  Tooth/dental problems, or  Sore throat,                No sneezing, itching, ear ache,  +nasal congestion, post nasal drip,   CV:  No chest pain,  Orthopnea, PND,  swelling in lower extremities, anasarca, dizziness, palpitations, syncope.   GI  No heartburn, indigestion, abdominal pain, nausea, vomiting, diarrhea, change in bowel habits, loss of appetite, bloody stools.   Resp: No chest wall deformity  Skin: no rash or lesions.  GU: no dysuria, change in color of urine, no urgency or frequency.  No flank pain, no hematuria   MS:  No joint pain or swelling.  No decreased range of motion.  No back pain.    Physical Exam  BP 140/88 (BP Location: Left Arm, Cuff Size: Normal)   Pulse 83   Ht 5\' 2"  (1.575 m)   Wt 162 lb (73.5 kg)   LMP  (LMP Unknown)   SpO2 100%   BMI 29.63 kg/m   GEN: A/Ox3; pleasant , NAD, well nourished    HEENT:  [place]/AT,  EACs-clear, TMs-wnl, NOSE-clear drainage tHROAT-clear, no lesions, no postnasal drip or exudate noted.   NECK:  Supple w/ fair ROM; no JVD; normal carotid impulses w/o bruits; no thyromegaly or nodules palpated; no lymphadenopathy.    RESP  Clear  P & A; w/o, wheezes/ rales/ or rhonchi. no accessory muscle use, no dullness to percussion  CARD:  RRR, no m/r/g, no peripheral edema, pulses intact, no cyanosis or clubbing.  GI:   Soft & nt; nml bowel sounds; no organomegaly or masses detected.   Musco: Warm bil, no deformities or joint swelling noted.   Neuro: alert, no focal deficits noted.    Skin: Warm, no lesions or rashes    Lab Results:  CBC      Component Value Date/Time   PROBNP 13.0 02/23/2016 1122    Imaging: No results found.    PFT Results Latest Ref Rng & Units 04/18/2018    FVC-Pre L 1.19  FVC-Predicted Pre % 55  FVC-Post L 1.25  FVC-Predicted Post % 58  Pre FEV1/FVC % % 89  Post FEV1/FCV % % 91  FEV1-Pre L 1.05  FEV1-Predicted Pre % 64  FEV1-Post L 1.13  DLCO UNC% % 53  DLCO COR %Predicted % 147  TLC L 2.73  TLC % Predicted % 58  RV % Predicted % 64    Lab Results  Component Value Date   NITRICOXIDE 18 11/16/2017        Assessment & Plan:   Mild persistent chronic asthma without complication Doing well on current regimen.  Encouraged her on budesonide and Perforomist compliance.  Patient's medications were reviewed today and patient education was given. Computerized medication calendar was adjusted/completed   Plan  Patient Instructions  Continue on Budesonide and Perforomist Neb Twice daily  , rinse after use.  Decrease Gabapentin 300mg  At bedtime  .  Follow med calendar closely and bring to each visit  Follow up Dr. Melvyn Novas  Or Maleaha Hughett NP In 2-3  month and As needed   Please contact office for sooner follow up if symptoms do not improve or worsen or seek emergency care       G E R D Cont on GERD rx and diet   Upper airway cough syndrome with VCD Chronic cough is improved on current regimen.  Will continue on decreasing gabapentin as able.  Decrease to gabapentin 300 mg at bedtime.  Continue on cough control regimen  Plan  Patient Instructions  Continue on Budesonide and Perforomist Neb Twice daily  , rinse after use.  Decrease Gabapentin 300mg  At bedtime  .  Follow med calendar closely and bring to each visit  Follow up Dr.  Wert  Or Melbert Botelho NP In 2-3  month and As needed   Please contact office for sooner follow up if symptoms do not improve or worsen or seek emergency care            Rexene Edison, NP 05/27/2018

## 2018-05-30 ENCOUNTER — Other Ambulatory Visit (HOSPITAL_BASED_OUTPATIENT_CLINIC_OR_DEPARTMENT_OTHER): Payer: Self-pay

## 2018-05-30 DIAGNOSIS — G4710 Hypersomnia, unspecified: Secondary | ICD-10-CM

## 2018-05-30 DIAGNOSIS — R0683 Snoring: Secondary | ICD-10-CM

## 2018-05-30 DIAGNOSIS — R5383 Other fatigue: Secondary | ICD-10-CM

## 2018-05-30 DIAGNOSIS — G4730 Sleep apnea, unspecified: Secondary | ICD-10-CM

## 2018-06-03 NOTE — Progress Notes (Signed)
Chart and office note reviewed in detail  > agree with a/p as outlined    

## 2018-07-05 ENCOUNTER — Telehealth: Payer: Self-pay | Admitting: Internal Medicine

## 2018-07-05 NOTE — Telephone Encounter (Signed)
Based on call received from the patient, can she have [name] sent as preventative?  I still cannot reach the patient, have tried calling two additional times.  Message routed to app of day to review.   [name]: Can script be sent? Any specific instructions due to her asthma?

## 2018-07-05 NOTE — Telephone Encounter (Signed)
Called twice and unable to reach patient, busy signal. No vmail pick up.

## 2018-07-05 NOTE — Telephone Encounter (Signed)
Per TP: would hold off on script until we can speak with patient.  Thank you.

## 2018-07-05 NOTE — Telephone Encounter (Signed)
Called patient x2 and kept getting busy signal. Will call back.

## 2018-07-08 NOTE — Telephone Encounter (Signed)
Called pt and unable to reach due to busy signal x 3. Will try again later

## 2018-07-09 NOTE — Telephone Encounter (Signed)
Called patient, unable to reach and unable to leave voicemail. Will call back.  

## 2018-07-10 NOTE — Telephone Encounter (Signed)
ATC Patient x's 3,recieved busy signal each time.  Will try again at a later time.

## 2018-07-11 NOTE — Telephone Encounter (Addendum)
Attempted to call pt x2 but line kept ringing busy. Will try to call back later.

## 2018-07-12 NOTE — Telephone Encounter (Signed)
Will close this encounter per triage protocol.

## 2018-07-14 ENCOUNTER — Ambulatory Visit (HOSPITAL_BASED_OUTPATIENT_CLINIC_OR_DEPARTMENT_OTHER): Payer: Medicare Other | Attending: Internal Medicine | Admitting: Internal Medicine

## 2018-07-14 VITALS — Ht 62.0 in | Wt 155.0 lb

## 2018-07-14 DIAGNOSIS — G4730 Sleep apnea, unspecified: Secondary | ICD-10-CM | POA: Diagnosis not present

## 2018-07-14 DIAGNOSIS — R5383 Other fatigue: Secondary | ICD-10-CM

## 2018-07-14 DIAGNOSIS — G4710 Hypersomnia, unspecified: Secondary | ICD-10-CM | POA: Insufficient documentation

## 2018-07-14 DIAGNOSIS — R0683 Snoring: Secondary | ICD-10-CM | POA: Insufficient documentation

## 2018-07-16 ENCOUNTER — Telehealth: Payer: Self-pay | Admitting: Cardiology

## 2018-07-16 ENCOUNTER — Telehealth: Payer: Self-pay | Admitting: Internal Medicine

## 2018-07-16 NOTE — Telephone Encounter (Signed)
Called patient back. Patient stated she wanted to know if she needs to not go to work during this [place] situation. 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 Will forward to Dr. Marlou Porch for advisement.

## 2018-07-16 NOTE — Telephone Encounter (Signed)
LMTCB

## 2018-07-16 NOTE — Telephone Encounter (Signed)
New Message:    Pt wants to know if she should go to work tomorrow? She is concerned,  because of all her conditions. 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

## 2018-07-17 NOTE — Telephone Encounter (Signed)
ATC patient twice, received a busy tone each time. Will attempt to call back later.

## 2018-07-17 NOTE — Telephone Encounter (Signed)
Her cardiac disease thankfully has been stable.  I would seek the advisement of either her pulmonary doctor or primary care doctor in this situation as well as the precautions being taken care of by her employer. Candee Furbish, MD

## 2018-07-18 NOTE — Telephone Encounter (Signed)
ATC Patient.  Phone rings busy.  Will attempt to call at a later time.

## 2018-07-18 NOTE — Telephone Encounter (Signed)
I spoke with the patient and gave her Dr Marlou Porch' recommendations.  She verbalized understanding and was thankful for the call.

## 2018-07-19 ENCOUNTER — Telehealth: Payer: Self-pay | Admitting: Cardiology

## 2018-07-19 NOTE — Telephone Encounter (Signed)
Attempted to call pt but immediately got a busy tone. Will try to call back later.

## 2018-07-19 NOTE — Telephone Encounter (Signed)
New message    Pt c/o swelling: STAT is pt has developed SOB within 24 hours  1) How much weight have you gained and in what time span? No   2) If swelling, where is the swelling located? Both feet   3) Are you currently taking a fluid pill? Yes   4) Are you currently SOB? No   5) Do you have a log of your daily weights (if so, list)? Yes , can provide if needed   6) Have you gained 3 pounds in a day or 5 pounds in a week? No   7) Have you traveled recently? No   Please call the patient per Pam at [facility].

## 2018-07-19 NOTE — Telephone Encounter (Signed)
[name] reports increased swelling in her lower extremities since she has not been working due to coronavirus. She has gained a couple pounds, but "nothing crazy." She did not have readings. She has no CP, SOB, dizziness, lightheadedness.   She has no BP or HR readings to report. She is currently taking torsemide 20 mg daily.  She states she will take another 20 mg (as directed by prescribing provider). Instructed her to call next week if swelling has not improved or has worsened. She was grateful for call.

## 2018-07-20 NOTE — Procedures (Signed)
   NAME: [name] DATE OF BIRTH:  09/18/1950 MEDICAL RECORD NUMBER [id number]  LOCATION: [facility]  PHYSICIAN: Marius Ditch  DATE OF STUDY: 07/14/2018  SLEEP STUDY TYPE: Nocturnal Polysomnogram               REFERRING PHYSICIAN: Marius Ditch, MD  INDICATION FOR STUDY: unexpectedly negative HSAT in patient with snoring, excessive daytime sleepiness; patient notes short TIB   EPWORTH SLEEPINESS SCORE:  21 HEIGHT: 5\' 2"  (157.5 cm)  WEIGHT: 155 lb (70.3 kg)    Body mass index is 28.35 kg/m.  NECK SIZE: 14 in.  MEDICATIONS  Patient self administered medications include: FAMOTIDINE, DIVALPROLEX ER, TORSEMIDE, METFORMIN, GABAPENTIN, ALPRAZOLAM. Medications administered during study include No sleep medicine administered.Marland Kitchen   SLEEP STUDY TECHNIQUE  A multi-channel overnight Polysomnography study was performed. The channels recorded and monitored were central and occipital EEG, electrooculogram (EOG), submentalis EMG (chin), nasal and oral airflow, thoracic and abdominal wall motion, anterior tibialis EMG, snore microphone, electrocardiogram, and a pulse oximetry.   TECHNICAL COMMENTS  Comments added by Technician: ONE RESTROOM VISTED Comments added by Scorer: N/A   SLEEP ARCHITECTURE  The study was initiated at 10:42:34 PM and terminated at 5:13:47 AM. The total recorded time was 391.2 minutes. EEG confirmed total sleep time was 373.5 minutes yielding a sleep efficiency of 95.5%%. Sleep onset after lights out was 4.5 minutes with a REM latency of 104.5 minutes. The patient spent 2.1%% of the night in stage N1 sleep, 79.8%% in stage N2 sleep, 0.0%% in stage N3 and 18.1% in REM. Wake after sleep onset (WASO) was 13.3 minutes. The Arousal Index was 9.0/hour.   RESPIRATORY PARAMETERS  There were a total of 8 respiratory disturbances out of which 0 were apneas ( 0 obstructive, 0 mixed, 0 central) and 8 hypopneas. The apnea/hypopnea index (AHI) was 1.3  events/hour. The central sleep apnea index was 0.0 events/hour. The REM AHI was 5.3 events/hour and NREM AHI was 0.4 events/hour. The supine AHI was 2.7 events/hour. Respiratory disturbances were associated with oxygen desaturation down to a nadir of 86.0% during sleep. The mean oxygen saturation during the study was 94.4%. The cumulative time under 88% oxygen saturation was 5.5 minutes.  LEG MOVEMENT DATA  The total leg movements were 0 with a resulting leg movement index of 0.0/hr . Associated arousal with leg movement index was 0.0/hr.   CARDIAC DATA  The underlying cardiac rhythm was most consistent with sinus rhythm. Mean heart rate during sleep was 70.3 bpm. Additional rhythm abnormalities include None.   IMPRESSIONS  No Significant Obstructive Sleep apnea(OSA)  DIAGNOSIS  Excessive daytime sleepiness.   RECOMMENDATIONS  No treatment for sleep disordered breathing is seen in this study.   Marius Ditch Sleep specialist, [place] [employer]  ELECTRONICALLY SIGNED ON:  07/20/2018, 9:16 PM [place] PH: [phone]   FX: [phone] [place]

## 2018-07-22 NOTE — Telephone Encounter (Signed)
Unable to reach the patient on [phone]. Had to LVM on her home # [phone]. Advised in the vmail for the patient to stay at home as much as possible. Limit visits to home and if possible communication by phone only. Advised to adhere to cdc guidelines and to go to their web site [email or web address] to obtain detailed information.

## 2018-07-22 NOTE — Telephone Encounter (Signed)
Patient returning phone call.  Would like to know if she needs to stay inside her house. Patient phone number is [phone].

## 2018-07-26 ENCOUNTER — Ambulatory Visit: Payer: Medicare Other | Admitting: Internal Medicine

## 2018-07-29 ENCOUNTER — Telehealth: Payer: Self-pay | Admitting: Cardiology

## 2018-07-29 NOTE — Telephone Encounter (Signed)
Called and spoke with patient who admits to eating more salt than normal because "I'm just here stuck in the house with these kids and everything."  She reports wt gain over night, swelling in feet, legs and abdomen.  She reports to she work to cut back on her NA+ intake and today took Torsemide 20 mg (2) tablets this am.  She will continue to monitor wts, keep feet/legs elevated, avoid NA+ and c/b if no improvement is wt or s/s.  She was grateful for the call.

## 2018-07-29 NOTE — Telephone Encounter (Signed)
New message  Pt c/o swelling: STAT is pt has developed SOB within 24 hours  1) How much weight have you gained and in what time span? Gained 5.4 lbs within 2 days   2) If swelling, where is the swelling located?feet and ankles   3) Are you currently taking a fluid pill? Yes   4) Are you currently SOB? No   5) Do you have a log of your daily weights (if so, list)? Yes, can provide if needed   6) Have you gained 3 pounds in a day or 5 pounds in a week? No   7) Have you traveled recently? No   Per Olin Hauser with [facility] states that she will also fax this information to the [phone] fax # on today

## 2018-08-01 ENCOUNTER — Encounter: Payer: Self-pay | Admitting: Internal Medicine

## 2018-08-01 ENCOUNTER — Telehealth: Payer: Self-pay | Admitting: Internal Medicine

## 2018-08-01 ENCOUNTER — Other Ambulatory Visit: Payer: Self-pay

## 2018-08-01 ENCOUNTER — Ambulatory Visit (INDEPENDENT_AMBULATORY_CARE_PROVIDER_SITE_OTHER): Payer: Medicare Other | Admitting: Internal Medicine

## 2018-08-01 ENCOUNTER — Telehealth: Payer: Self-pay | Admitting: Cardiology

## 2018-08-01 DIAGNOSIS — J4521 Mild intermittent asthma with (acute) exacerbation: Secondary | ICD-10-CM | POA: Diagnosis not present

## 2018-08-01 MED ORDER — PREDNISONE 10 MG PO TABS: ORAL_TABLET | ORAL | 11 refills | Status: DC

## 2018-08-01 NOTE — Telephone Encounter (Signed)
Called and spoke with pt who stated she had an asthma attack around 6am and currently has wheezing from it. Pt had to call EMS due to her attack and they gave her 4-5 breathing treatments which pt stated helped with her symptoms.  Pt has a mild dry cough which is not as bad as it was when EMS was at her house.  Pt stated EMS told her that she might need to have steroids prescribed to help with her symptoms.  Dr. Melvyn Novas., please advise recs for pt. Thanks!

## 2018-08-01 NOTE — Telephone Encounter (Signed)
Pt c/o Shortness Of Breath: STAT if SOB developed within the last 24 hours or pt is noticeably SOB on the phone  1. Are you currently SOB (can you hear that pt is SOB on the phone)? Yes   2. How long have you been experiencing SOB? Yes   3. Are you SOB when sitting or when up moving around? Yes  4. Are you currently experiencing any other symptoms? Swelling in legs and her knees. Please call the nurse.

## 2018-08-01 NOTE — Progress Notes (Signed)
Subjective:   Patient ID: ENZLEY KITCHENS, female    DOB: 04-13-1951    MRN: [id number]    Brief patient profile:  35 yobf quit smoking 2005 with nl pfts 2012  with recurrent non-specific resp flares with nl pfts during flares c/w pseudoasthma.     History of Present Illness  01/24/2016  Extended post hosp f/u ov/ transition of care/ /Akai Dollard re: dtca asthma/ vcd symb 160 and pred 5 x 10 mg daily  Chief Complaint  Patient presents with  . Follow-up    Pt. recently got out the [facility] for an asthma attack, Pt. states her breathing remains the same, coughing with some yellow to white mucus,Has been needing to use Ventolin more often  last doing well 2 months prior to OV   While on Symb 160/ gerd rx but not dosing ac as rec  Waking up prematurely x 2 months with cough/wheeze/ sob but never contacted me as per the instructions rec Plan A = Automatic = dulera 100 Take 2 puffs first thing in am and then another 2 puffs about 12 hours later.                                      Nexum 40 mg Take 30-60 min before first meal of the day and Pepcid AC 20 mg  at bedtime Work on inhaler technique  Plan B = Backup Only use your albuterol as a rescue medication Plan C = Crisis - only use your albuterol nebulizer if you first try Plan B and it fails to help > ok to use the nebulizer up to every 4 hours but if start needing it regularly call for immediate appointment GERD diet  Take delsym two tsp every 12 hours and use the flutter valve as much as possible and supplement if needed with  tramadol 50 mg up to 2 every 4 hours to suppress the urge to cough  Once you have eliminated the cough for 3 straight days try reducing the tramadol first,  then the delsym as tolerated.   Prednisone 5 mg x 8, then 6, then 4 then 2, then 1 daily x 2 days and off  please schedule a follow up office visit in 2 weeks, sooner if needed with all meds/ inhalers/ neb solutions in hand     09/18/2016  f/u ov/Arel Tippen re:  UACS vs  Asthma brought med calendar but not updated / on symb 160 / max gerd rx  Chief Complaint  Patient presents with  . Follow-up    Pt c/o occ wheezing. She has not used rescue inhaler or neb recently.   still feels wheezing  And doe = MMRC1 = can walk nl pace, flat grade, can't hurry or go uphills or steps s sob rec Change the symbicort to 80 Take 2 puffs first thing in am and then another 2 puffs about 12 hours later.  Work on inhaler technique:  Use the med calendar daily to organize your medications as a "checklist" See [name] NP in 4  weeks with all your medications> never happened    Admit date: 12/19/2017 Discharge date: 12/24/2017  D/c home with steroids taper, monitor blood glucose while on steroids She reports run out of pulmicort nebs, new prescription provided, she is to follow up with Dr Melvyn Novas to further discuss asthma/copd meds, she is concerned about cost of these meds  Discharge Diagnoses:  Active Hospital Problems   Diagnosis Date Noted  . Acute bronchitis with asthma with acute exacerbation 01/16/2016  . Acute renal failure superimposed on stage 3 chronic kidney disease ([place]) 07/17/2016  . Chronic diastolic CHF (congestive heart failure) ([place]) 07/17/2016  . Acute respiratory failure with hypoxia ([place])   . Upper airway cough syndrome vs Asthma  08/28/2014  . Diabetes mellitus type 2, controlled ([place]) 08/06/2014  . Essential hypertension 07/21/2008           12/28/2017 ext post hosp f/u ov/Paislyn Domenico re: confused with meds again / only slightly better breathing  Chief Complaint  Patient presents with  . Follow-up    Breathing has improved slightly. She has been out of brovana due to high cost x 3 days. She has had to use her albuterol inhaler and neb daily since then.   Dyspnea:  50 ft = About the same as usual Cough: after supper is the worse, but not much mucus Sleeping: wakes up coughing 30 degrees due to sensation of reflux    SABA use: last 5 h prior to OV    02: none  Sleeping at 30 degrees    rec Plan A = Automatic = Performist 20 mcg one vial  with pulmocort one half vial twice daily  Gabapentin 100 mg three times daily Nexium Take 30- 60 min before your first and last meals of the day and zantac (ranitidine) at bedtime  Along with chlorpheniramine 4 mg 1 or 2 also at bedtime (over the counter)  Continue prednisone 10 mg every day  Plan B = Backup for breathing Only use your albuterol as a rescue medication  Plan B = Backup for coughing  Add tramadol 50 mg 1-2 every 4 hours if needed  Plan C = Crisis - only use your albuterol nebulizer if you first try Plan B and it fails to help > ok to use the nebulizer up to every 4 hours but if start needing it regularly call for immediate appointment     03/11/2018  f/u ov/Leviticus Harton re: 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 Follow-up    Cough had improved and then started back 1 day ago.  Cough is now non prod.  She is using her albuterol inhaler 2 x daily on average. She has not needed albuterol neb.    Dyspnea:  MMRC2 = can't walk a nl pace on a flat grade s sob but does fine slow and flat slowed by knee  Cough: noct > day  Sleeping: 45 degrees due to subjective reflux despite max acid suppresion  SABA use: as above - never noct 02: none   rec Gabapentin 100 mg four  times daily  Take chortab 4 mg x 2 at bedtime and up to every 4 hours during the day as needed for drainage  Take delsym two tsp every 12 hours and supplement if needed with  tramadol 50 mg up to 2 every 4 hours to suppress the urge to cough. Swallowing water and/or using ice chips/non mint and menthol containing candies (such as lifesavers or sugarless jolly ranchers) are also effective.  You should rest your voice and avoid activities that you know make you cough. Once you have eliminated the cough for 3 straight days try reducing the tramadol first,  then the delsym as tolerated.  Plan D = Prednisone x 6 days  if not cough/ sob not better See calendar for specific medication instructions and bring it back for  each and every office visit for every healthcare provider you see.  Without it,  you may not receive the best quality medical care that we feel you deserve. Please schedule a follow up office visit in 4 weeks, call sooner if needed with all medications /inhalers/ solutions in hand so we can verify exactly what you are taking. This includes all medications from all doctors and over the [place] separate them into two bags:  the ones you take automatically, no matter what, vs the ones you take just when you feel you need them "BAG #2 is UP TO YOU"  - this will really help [place] help you take your medications more effectively.    03/15/18 back to ER - did not activate action plan prior (never took more than 1 tramadol every 12 hours to control cough.  03/20/2018  Extended post hops  f/u ov/Bret Vanessen re: transition of care re   vcd vs asthma  Chief Complaint  Patient presents with  . Follow-up    recent admission 03/15/18- pt reports of wheezing, sob with exertion, no prod cough & chest tightness.   Dyspnea:  MMRC4  = sob if tries to leave home or while getting dressed   - no better now vs during hosp stay. Seen by Dr [place] Bells who strongly supported vcd dx during admit Cough: dry cough settles down overnight and with plb as does the "wheeze"  Sleeping: 45 degrees SABA use: last neb 8 h prior to OV  "did not help" 02: no  rec Increase gabapentin to 100 mg take 2 (=200)  four times daily as per med calendar For cough > delsym 2 tsp every 12 hours with use of flutter valve as much as possible  and supplement with tramadol 50 mg up to 1-2 every 4 hours as needed  For breathing problems not improving with use of nebulizer > Prednisone 10 mg take  4 each am x 2 days,   2 each am x 2 days,  1 each am x 2 days and stop  GERD diet / lifestyle recs Keep previous appt with your medications in 2 bags     04/05/2018  f/u ov/Shiro Ellerman re: uacs/ VCD - brought all meds but confused with gen vs trade names, could not tell me how she is supposed to take esmeprazole when I handed it to her.  Chief Complaint  Patient presents with  . Follow-up  Dyspnea:  50 ft  Cough: better  Sleeping: 45 degrees SABA use: twice a day hfa , rare neb 02: none   rec Gabapentin 300 mg four times daily  Budesonide 0.25 mcg twice daily with performist If breathing worse > Prednisone 10 mg take  4 each am x 2 days,   2 each am x 2 days,  1 each am x 2 days and stop  For cough > Take delsym two tsp every 12 hours and flutter valve as much as possible supplement if needed with  tramadol 50 mg (or tylenol #3) up to 2 every 4 hours to suppress the urge to cough. Swallowing water and/or using ice chips/non mint and menthol containing candies (such as lifesavers or sugarless jolly ranchers) are also effective.  You should rest your voice and avoid activities that you know make you cough. Once you have eliminated the cough for 3 straight days try reducing the tramadol first,  then the delsym as tolerated. Nexium (esmaprazole)  Take 30- 60 min before your first and last meals of  the day  Please schedule a follow up office visit in 2  weeks, sooner if needed - needs full pfts on return      04/18/2018  f/u ov/Kameryn Tisdel re: asthma vs vcd  - brought most but not all her meds - missing nexium, has budesonide in prn bag/ pill organizer has 2 slots per day and hard to read to top print on each slot Chief Complaint  Patient presents with  . Follow-up    PFT's done today. She c/o increased SOB for the past 3 days. She is using her albuterol inhaler and neb with albuterol both about 2 x per day.   Dyspnea:  Improved, able to do [employer] shopping now  Cough: varies s pattern, min mucoid  Sleeping: 45 degrees all pillows  SABA use: twice daily due to overdoing it at work 02: none   rec Continue brovana and budesonide Take the prednisone if  getting worse See [name] NP w/in 4  weeks with all your medications     05/27/2018 much better but admitted not c/w neb bud/ perfomist and too sleepy from gabapentin 300 bid but no longer coughing  rec Continue on Budesonide and Perforomist Neb Twice daily, rinse after use.  Decrease Gabapentin 300 mg At bedtime  .  Follow med calendar closely and bring to each visit      Virtual Visit via Telephone Note 08/01/2018   I connected with [name] on 08/01/18 at  4:00 PM EDT by telephone and verified that I am speaking with the correct person using two identifiers.   I discussed the limitations, risks, security and privacy concerns of performing an evaluation and management service by telephone and the availability of in person appointments. I also discussed with the patient that there may be a patient responsible charge related to this service. The patient expressed understanding and agreed to proceed.   History of Present Illness: Doing fine until am 08/01/2018 on q am bud 0.5 / performist but legs more swollen than usual x 24 h and chronically not on hs h2 as rec  The woke up at usual time this am legs still swollen and wt up 4lb over baseline and sob at rest/ harsh dry cough > no resp to neb so 911 > bp 149/100 and took 4 nebs   Dyspnea:  Better / wheezing in throat only Cough: harsh but dry  Sleeping: fine 45 degrees with pillows  until woke up this am sob at usual time  SABA use: not using at all until this am  02: none    No obvious day to day or daytime variability or assoc excess/ purulent sputum or mucus plugs or hemoptysis or cp or chest tightness, subjective wheeze or overt sinus or hb symptoms.    Also denies any obvious fluctuation of symptoms with weather or environmental changes or other aggravating or alleviating factors except as outlined above.   Meds reviewed/ med reconciliation completed > "note paramedics took my calendar" so new one generated today       Observations/Objective: slt hoarse, talking in full sentences    Assessment and Plan: See problem list for active a/p's   Follow Up Instructions: See avs for instructions unique to this ov which includes revised/ updated med list     I discussed the assessment and treatment plan with the patient. The patient was provided an opportunity to ask questions and all were answered. The patient agreed with the plan and demonstrated an understanding of  the instructions.   The patient was advised to call back or seek an in-person evaluation if the symptoms worsen or if the condition fails to improve as anticipated.  I provided 30  minutes of non-face-to-face time during this encounter.   Christinia Gully, MD

## 2018-08-01 NOTE — Assessment & Plan Note (Signed)
Acute flare in pt prev well controlled assoc with leg swelling and gerd hx no longer on pepcid at hs as rec so ddx is between cardiac asthma or reflux mediated asthma or combination of both but not typical at all of primary asthma   rec Plan ABCD reviewed verbally and pt was sent copy of written action plan   D = Prednisone 10 mg take  4 each am x 2 days,   2 each am x 2 days,  1 each am x 2 days and stop / refillable to avoid er /admit   Resume pepcid 20 mg hs   Resume flutter valve prn  Diuretics per cards       Each maintenance medication was reviewed in detail including most importantly the difference between maintenance and as needed and under what circumstances the prns are to be used. This was done in the context of a medication calendar review which provided the patient with a user-friendly unambiguous mechanism for medication administration and reconciliation and provides an action plan for all active problems. It is critical that this be shown to every doctor  for modification during the office visit if necessary so the patient can use it as a working document.

## 2018-08-01 NOTE — Telephone Encounter (Signed)
Will need televisit

## 2018-08-01 NOTE — Telephone Encounter (Signed)
Phone note received from Triage. I called and spoke with the patient. She had taken her inhaler and two torsemide tablets (she usually only takes one). She states this breathing problem just started this morning. She is SOB on the phone and actively wheezing. She could not say more than three words at a time without gasping for air. She had not called Dr. Gustavus Bryant office yet. I told her I was worried about her breathing and that she needed to call EMS. EMS will be able to help with her rescue inhaler and determine if she needs to be seen in the ER. She expressed understanding of the plan.

## 2018-08-01 NOTE — Telephone Encounter (Signed)
Called and spoke with pt stating to her that MW needs [place] to schedule televisit with him so he can further evaluate. Pt expressed understanding. televisit has been scheduled for pt with MW at 4pm. Nothing further needed.

## 2018-08-01 NOTE — Telephone Encounter (Signed)
Unable to get in touch Audiological scientist. Called patient to discuss her symptoms. Patient complaining of SOB, BLE edema, and  4 lb wt gain over night. Patient sounded SOB with wheezing over the phone. Patient stated she has taken demadex 20 mg, albuterol inhaler, and had a nebulizer treatment this morning. Patient had talked to Dr. Marlou Porch' nurse on 07/28/17 about the same thing and she said her swelling in her legs has gone down some. Patient also has asthma and sees Dr. Melvyn Novas. Encouraged patient to call Dr. Melvyn Novas as well. Patient stated Dr. Melvyn Novas told her if her symptoms were worse not to go to the ED, but call 911.  Informed patient that a message would be sent to our DOD, Dr. Meda Coffee for advisement, but if her symptoms got worse to call 911 as Dr. Melvyn Novas had advised.

## 2018-08-01 NOTE — Patient Instructions (Signed)
Plan A = automatic >>>   When not doing great you need to be taking your budesonide/perforomist twice daily as per med calendar, not once daily   Add back pepcid (famotidine) 20 mg at bedtime as per med calendar  If needing more albuterol than usual, immediately start on Plan D as per med calendar=  prednisone  10 mg take  4 each am x 2 days,   2 each am x 2 days,  1 each am x 2 days and stop and be sure you are taking the plan A meds as above twice daily   For cough use flutter valve as much as needed   Please schedule a televisit in  4 weeks, call sooner if needed

## 2018-08-02 NOTE — Telephone Encounter (Signed)
Reviewed note from Dr. Melvyn Novas.  Steroids given Continue diuretics  Salt restriction.  Thanks Candee Furbish, MD

## 2018-08-23 ENCOUNTER — Other Ambulatory Visit: Payer: Self-pay

## 2018-08-23 ENCOUNTER — Encounter: Payer: Self-pay | Admitting: Podiatry

## 2018-08-23 ENCOUNTER — Ambulatory Visit: Payer: Medicare Other | Admitting: Podiatry

## 2018-08-23 DIAGNOSIS — B351 Tinea unguium: Secondary | ICD-10-CM

## 2018-08-23 DIAGNOSIS — M79674 Pain in right toe(s): Secondary | ICD-10-CM | POA: Diagnosis not present

## 2018-08-23 DIAGNOSIS — E119 Type 2 diabetes mellitus without complications: Secondary | ICD-10-CM | POA: Diagnosis not present

## 2018-08-23 DIAGNOSIS — M79675 Pain in left toe(s): Secondary | ICD-10-CM

## 2018-08-23 NOTE — Patient Instructions (Signed)
Diabetes Mellitus and Foot Care Foot care is an important part of your health, especially when you have diabetes. Diabetes may cause you to have problems because of poor blood flow (circulation) to your feet and legs, which can cause your skin to:  Become thinner and drier.  Break more easily.  Heal more slowly.  Peel and crack. You may also have nerve damage (neuropathy) in your legs and feet, causing decreased feeling in them. This means that you may not notice minor injuries to your feet that could lead to more serious problems. Noticing and addressing any potential problems early is the best way to prevent future foot problems. How to care for your feet Foot hygiene  Wash your feet daily with warm water and mild soap. Do not use hot water. Then, pat your feet and the areas between your toes until they are completely dry. Do not soak your feet as this can dry your skin.  Trim your toenails straight across. Do not dig under them or around the cuticle. File the edges of your nails with an emery board or nail file.  Apply a moisturizing lotion or petroleum jelly to the skin on your feet and to dry, brittle toenails. Use lotion that does not contain alcohol and is unscented. Do not apply lotion between your toes. Shoes and socks  Wear clean socks or stockings every day. Make sure they are not too tight. Do not wear knee-high stockings since they may decrease blood flow to your legs.  Wear shoes that fit properly and have enough cushioning. Always look in your shoes before you put them on to be sure there are no objects inside.  To break in new shoes, wear them for just a few hours a day. This prevents injuries on your feet. Wounds, scrapes, corns, and calluses  Check your feet daily for blisters, cuts, bruises, sores, and redness. If you cannot see the bottom of your feet, use a mirror or ask someone for help.  Do not cut corns or calluses or try to remove them with medicine.  If you  find a minor scrape, cut, or break in the skin on your feet, keep it and the skin around it clean and dry. You may clean these areas with mild soap and water. Do not clean the area with peroxide, alcohol, or iodine.  If you have a wound, scrape, corn, or callus on your foot, look at it several times a day to make sure it is healing and not infected. Check for: ? Redness, swelling, or pain. ? Fluid or blood. ? Warmth. ? Pus or a bad smell. General instructions  Do not cross your legs. This may decrease blood flow to your feet.  Do not use heating pads or hot water bottles on your feet. They may burn your skin. If you have lost feeling in your feet or legs, you may not know this is happening until it is too late.  Protect your feet from hot and cold by wearing shoes, such as at the beach or on hot pavement.  Schedule a complete foot exam at least once a year (annually) or more often if you have foot problems. If you have foot problems, report any cuts, sores, or bruises to your health care provider immediately. Contact a health care provider if:  You have a medical condition that increases your risk of infection and you have any cuts, sores, or bruises on your feet.  You have an injury that is not   healing.  You have redness on your legs or feet.  You feel burning or tingling in your legs or feet.  You have pain or cramps in your legs and feet.  Your legs or feet are numb.  Your feet always feel cold.  You have pain around a toenail. Get help right away if:  You have a wound, scrape, corn, or callus on your foot and: ? You have pain, swelling, or redness that gets worse. ? You have fluid or blood coming from the wound, scrape, corn, or callus. ? Your wound, scrape, corn, or callus feels warm to the touch. ? You have pus or a bad smell coming from the wound, scrape, corn, or callus. ? You have a fever. ? You have a red line going up your leg. Summary  Check your feet every day  for cuts, sores, red spots, swelling, and blisters.  Moisturize feet and legs daily.  Wear shoes that fit properly and have enough cushioning.  If you have foot problems, report any cuts, sores, or bruises to your health care provider immediately.  Schedule a complete foot exam at least once a year (annually) or more often if you have foot problems. This information is not intended to replace advice given to you by your health care provider. Make sure you discuss any questions you have with your health care provider. Document Released: 04/14/2000 Document Revised: 05/30/2017 Document Reviewed: 05/19/2016 Elsevier Interactive Patient Education  2019 [employer]  Onychomycosis/Fungal Toenails  WHAT IS IT? An infection that lies within the keratin of your nail plate that is caused by a fungus.  WHY ME? Fungal infections affect all ages, sexes, races, and creeds.  There may be many factors that predispose you to a fungal infection such as age, coexisting medical conditions such as diabetes, or an autoimmune disease; stress, medications, fatigue, genetics, etc.  Bottom line: fungus thrives in a warm, moist environment and your shoes offer such a location.  IS IT CONTAGIOUS? Theoretically, yes.  You do not want to share shoes, nail clippers or files with someone who has fungal toenails.  Walking around barefoot in the same room or sleeping in the same bed is unlikely to transfer the organism.  It is important to realize, however, that fungus can spread easily from one nail to the next on the same foot.  HOW DO WE TREAT THIS?  There are several ways to treat this condition.  Treatment may depend on many factors such as age, medications, pregnancy, liver and kidney conditions, etc.  It is best to ask your doctor which options are available to you.  1. No treatment.   Unlike many other medical concerns, you can live with this condition.  However for many people this can be a painful condition and  may lead to ingrown toenails or a bacterial infection.  It is recommended that you keep the nails cut short to help reduce the amount of fungal nail. 2. Topical treatment.  These range from herbal remedies to prescription strength nail lacquers.  About 40-50% effective, topicals require twice daily application for approximately 9 to 12 months or until an entirely new nail has grown out.  The most effective topicals are medical grade medications available through physicians offices. 3. Oral antifungal medications.  With an 80-90% cure rate, the most common oral medication requires 3 to 4 months of therapy and stays in your system for a year as the new nail grows out.  Oral antifungal medications do require   blood work to make sure it is a safe drug for you.  A liver function panel will be performed prior to starting the medication and after the first month of treatment.  It is important to have the blood work performed to avoid any harmful side effects.  In general, this medication safe but blood work is required. 4. Laser Therapy.  This treatment is performed by applying a specialized laser to the affected nail plate.  This therapy is noninvasive, fast, and non-painful.  It is not covered by insurance and is therefore, out of pocket.  The results have been very good with a 80-95% cure rate.  The [facility] is the only practice in the area to offer this therapy. 5. Permanent Nail Avulsion.  Removing the entire nail so that a new nail will not grow back. 

## 2018-08-26 NOTE — Progress Notes (Signed)
Subjective: [name] presents today for preventative diabetic foot care with painful, thick toenails 1-5 b/l that she cannot cut and which interfere with daily activities.  Pain is aggravated when wearing enclosed shoe gear.  Pt states she is vulnerable to COVID due to asthma.  Jacqueline Frees, MD is her PCP and last visit was 3 months ago per patient.   Current Outpatient Medications:  .  albuterol (PROAIR HFA) 108 (90 Base) MCG/ACT inhaler, Inhale 2 puffs into the lungs every 4 (four) hours as needed for wheezing or shortness of breath., Disp: 1 Inhaler, Rfl: 5 .  albuterol (PROVENTIL) (2.5 MG/3ML) 0.083% nebulizer solution, Take 2.5 mg by nebulization every 4 (four) hours as needed for wheezing or shortness of breath (((PLAN C)))., Disp: , Rfl:  .  ALPRAZolam (XANAX) 0.5 MG tablet, Take 1 tablet (0.5 mg total) by mouth 2 (two) times daily as needed for anxiety., Disp: 30 tablet, Rfl: 0 .  aspirin EC 81 MG tablet, Take 81 mg by mouth daily. , Disp: , Rfl:  .  atorvastatin (LIPITOR) 40 MG tablet, Take 40 mg by mouth at bedtime., Disp: , Rfl:  .  Biotin 5000 MCG CAPS, Take 1 capsule by mouth daily., Disp: , Rfl:  .  budesonide (PULMICORT) 0.25 MG/2ML nebulizer solution, One vial twice daily, Disp: 120 mL, Rfl: 12 .  dextromethorphan (DELSYM) 30 MG/5ML liquid, 2 tsp every 12 hours as needed w/ flutter, Disp: , Rfl:  .  diltiazem (CARDIZEM CD) 240 MG 24 hr capsule, Take 1 capsule (240 mg total) by mouth daily., Disp: 90 capsule, Rfl: 3 .  divalproex (DEPAKOTE ER) 500 MG 24 hr tablet, Take 2 tablets (1,000 mg total) by mouth at bedtime., Disp: 60 tablet, Rfl: 11 .  esomeprazole (NEXIUM) 40 MG capsule, Take 1 capsule (40 mg total) by mouth 2 (two) times daily before a meal. (Patient taking differently: Take 40 mg by mouth daily. ), Disp: 60 capsule, Rfl: 3 .  estradiol (ESTRACE) 1 MG tablet, Take 1 mg by mouth daily. , Disp: , Rfl:  .  famotidine (PEPCID) 20 MG tablet, Take 20 mg by mouth at  bedtime., Disp: , Rfl:  .  fluticasone (FLONASE) 50 MCG/ACT nasal spray, Place 2 sprays into both nostrils daily as needed for allergies or rhinitis., Disp: , Rfl:  .  formoterol (PERFOROMIST) 20 MCG/2ML nebulizer solution, Take 2 mLs (20 mcg total) by nebulization 2 (two) times daily. J41.0, Disp: 250 mL, Rfl: 0 .  gabapentin (NEURONTIN) 300 MG capsule, Take 300 mg by mouth at bedtime., Disp: , Rfl:  .  hydrALAZINE (APRESOLINE) 25 MG tablet, Take 1 tablet (25 mg total) by mouth 2 (two) times daily., Disp: 180 tablet, Rfl: 3 .  metFORMIN (GLUCOPHAGE) 500 MG tablet, Take 500 mg by mouth 2 (two) times daily with a meal., Disp: , Rfl:  .  nebivolol (BYSTOLIC) 2.5 MG tablet, Take 2.5 mg by mouth daily., Disp: , Rfl:  .  nitroGLYCERIN (NITROSTAT) 0.4 MG SL tablet, Place 1 tablet (0.4 mg total) under the tongue every 5 (five) minutes as needed for chest pain. x3 as needed, Disp: 25 tablet, Rfl: 6 .  potassium chloride SA (K-DUR,KLOR-CON) 20 MEQ tablet, Take 20 mEq by mouth daily. , Disp: , Rfl:  .  predniSONE (DELTASONE) 10 MG tablet, Take  4 each am x 2 days,   2 each am x 2 days,  1 each am x 2 days and stop, Disp: 14 tablet, Rfl: 11 .  torsemide (DEMADEX) 20 MG tablet, Take 20-60 mg by mouth daily. 1-3 tabs (20mg  -60mg ) by mouth once daily, Disp: , Rfl:   No Known Allergies  Objective: Vascular Examination: Capillary refill time immediate x 10 digits.  Dorsalis pedis and Posterior tibial pulses palpable b/l.  Digital hair present x 10 digits.  Skin temperature gradient WNL b/l.  Dermatological Examination: Skin with normal turgor, texture and tone b/l.  Toenails 1-5 b/l discolored, thick, dystrophic with subungual debris and pain with palpation to nailbeds due to thickness of nails.  Musculoskeletal: Muscle strength 5/5 to all LE muscle groups  No gross bony deformities b/l.  No pain, crepitus or joint limitation noted with ROM.   Neurological: Sensation intact with 10 gram  monofilament.  Vibratory sensation intact.  Assessment: Painful onychomycosis toenails 1-5 b/l  NIDDM  Plan: 1. Continue diabetic foot care principles. Literature dispensed today. 2. Toenails 1-5 b/l were debrided in length and girth without iatrogenic bleeding. 3. Patient to continue soft, supportive shoe gear daily. 4. Patient to report any pedal injuries to medical professional immediately. 5. Follow up 3 months.  6. Patient/POA to call should there be a concern in the interim.

## 2018-08-30 ENCOUNTER — Other Ambulatory Visit: Payer: Self-pay

## 2018-08-30 ENCOUNTER — Encounter: Payer: Self-pay | Admitting: Internal Medicine

## 2018-08-30 ENCOUNTER — Ambulatory Visit: Payer: Medicare Other | Admitting: Internal Medicine

## 2018-08-30 ENCOUNTER — Ambulatory Visit (INDEPENDENT_AMBULATORY_CARE_PROVIDER_SITE_OTHER): Payer: Medicare Other | Admitting: Internal Medicine

## 2018-08-30 DIAGNOSIS — J4530 Mild persistent asthma, uncomplicated: Secondary | ICD-10-CM | POA: Diagnosis not present

## 2018-08-30 NOTE — Assessment & Plan Note (Signed)
Onset of symptoms reported  2012 p quit smoking ing 2005  - FEN0 01/24/2016 = 11 with active symptoms on symb 160 2bid > changed to dulera 100  - Spirometry 02/23/2016  NO signifiant obstruction with active symptoms  - 02/23/2016  After extensive coaching HFA effectiveness =    75%  - FENO 02/23/2016  =  12   On dulera 100 2bid (unable to confirm adeherence as did not bring it - Singulair 10 mg daily added 02/23/2016 to dulera 100 2bid with 2 week sample only  (unable to verify she used it) - Allergy profile 02/23/16  >  Eos 0.0 /  IgE  87 pos  RAST  Grass/ trees  - 04/07/2016   symb 80 x one sample given - FENO 04/07/2016  =   43 ? Really on meds ?  - 05/08/2016  After extensive coaching HFA effectiveness =    75% > continue symb 80 2bid - 07/17/2016 added singulair maint   - FENO 09/18/2016  =   15 on symb 160 2bid > try 80 2bid  - flare late feb 2019 ? What maint rx she was taking but on admit "non-adherent per notes 08/12/17  - 09/18/2017  After extensive coaching inhaler device  effectiveness =    75% from a baseline of 50% > restart symb 80 2bid - FENO 09/18/2017  =   20 on symb 160  - Spirometry 09/18/2017  No obstruction at all with active wheeze 02/22/2018- bronchitis with asthma exac. Tx with augmentin and depo-medrol    All goals of chronic asthma control met including optimal function and elimination of symptoms with minimal need for rescue therapy.  Contingencies discussed in full including contacting this office immediately if not controlling the symptoms using the rule of two's.     Continues to be very confused with names of meds/ contingencies/ reviewed line by line as per avs   F/u in 6 weeks with all meds in hand using a trust but verify approach to confirm accurate Medication  Reconciliation The principal here is that until we are certain that the  patients are doing what we've asked, it makes no sense to ask them to do more.    Each maintenance medication was reviewed in  detail including most importantly the difference between maintenance and as needed and under what circumstances the prns are to be used.  Please see AVS for specific  Instructions which are unique to this visit and I personally typed out  which were reviewed in detail on the phone with the patient and a copy mailed.

## 2018-08-30 NOTE — Patient Instructions (Addendum)
Plan A = Automatic = Budesonide/perforomist twice daily as per med calendar  Plan B = Backup Only use your albuterol inhaler (prefer Proair- RED  but same as Ventolin-blue) as a rescue medication to be used if you can't catch your breath by resting or doing a relaxed purse lip breathing pattern.  - The less you use it, the better it will work when you need it. - Ok to use the inhaler up to 2 puffs  every 4 hours if you must but call for appointment if use goes up over your usual need - Don't leave home without it !!  (think of it like the spare tire for your car)   Plan C = Crisis - only use your albuterol nebulizer if you first try Plan B and it fails to help > ok to use the nebulizer up to every 4 hours but if start needing it regularly call for immediate appointment   Please schedule a follow up office visit in 6 weeks, call sooner if needed with all medications /inhalers/ solutions in hand so we can verify exactly what you are taking. This includes all medications from all doctors and over the counters

## 2018-08-30 NOTE — Progress Notes (Signed)
Subjective:   Patient ID: [name], female    DOB: 1950-11-25    MRN: [id number]    Brief patient profile:  13 yobf quit smoking 2005 with nl pfts 2012  with recurrent non-specific resp flares with nl pfts during flares c/w pseudoasthma.     History of Present Illness  01/24/2016  Extended post hosp f/u ov/ transition of care/ /Ardenia Stiner re: dtca asthma/ vcd symb 160 and pred 5 x 10 mg daily  Chief Complaint  Patient presents with  . Follow-up    Pt. recently got out the [facility] for an asthma attack, Pt. states her breathing remains the same, coughing with some yellow to white mucus,Has been needing to use Ventolin more often  last doing well 2 months prior to OV   While on Symb 160/ gerd rx but not dosing ac as rec  Waking up prematurely x 2 months with cough/wheeze/ sob but never contacted me as per the instructions rec Plan A = Automatic = dulera 100 Take 2 puffs first thing in am and then another 2 puffs about 12 hours later.                                      Nexum 40 mg Take 30-60 min before first meal of the day and Pepcid AC 20 mg  at bedtime Work on inhaler technique  Plan B = Backup Only use your albuterol as a rescue medication Plan C = Crisis - only use your albuterol nebulizer if you first try Plan B and it fails to help > ok to use the nebulizer up to every 4 hours but if start needing it regularly call for immediate appointment GERD diet  Take delsym two tsp every 12 hours and use the flutter valve as much as possible and supplement if needed with  tramadol 50 mg up to 2 every 4 hours to suppress the urge to cough  Once you have eliminated the cough for 3 straight days try reducing the tramadol first,  then the delsym as tolerated.   Prednisone 5 mg x 8, then 6, then 4 then 2, then 1 daily x 2 days and off  please schedule a follow up office visit in 2 weeks, sooner if needed with all meds/ inhalers/ neb solutions in hand     09/18/2016  f/u ov/Marquis Diles re:  UACS vs  Asthma brought med calendar but not updated / on symb 160 / max gerd rx  Chief Complaint  Patient presents with  . Follow-up    Pt c/o occ wheezing. She has not used rescue inhaler or neb recently.   still feels wheezing  And doe = MMRC1 = can walk nl pace, flat grade, can't hurry or go uphills or steps s sob rec Change the symbicort to 80 Take 2 puffs first thing in am and then another 2 puffs about 12 hours later.  Work on inhaler technique:  Use the med calendar daily to organize your medications as a "checklist" See [name] NP in 4  weeks with all your medications> never happened    Admit date: 12/19/2017 Discharge date: 12/24/2017  D/c home with steroids taper, monitor blood glucose while on steroids She reports run out of pulmicort nebs, new prescription provided, she is to follow up with Dr Melvyn Novas to further discuss asthma/copd meds, she is concerned about cost of these meds  Discharge Diagnoses:  Active Hospital Problems   Diagnosis Date Noted  . Acute bronchitis with asthma with acute exacerbation 01/16/2016  . Acute renal failure superimposed on stage 3 chronic kidney disease ([place]) 07/17/2016  . Chronic diastolic CHF (congestive heart failure) ([place]) 07/17/2016  . Acute respiratory failure with hypoxia ([place])   . Upper airway cough syndrome vs Asthma  08/28/2014  . Diabetes mellitus type 2, controlled ([place]) 08/06/2014  . Essential hypertension 07/21/2008           12/28/2017 ext post hosp f/u ov/Latroya Ng re: confused with meds again / only slightly better breathing  Chief Complaint  Patient presents with  . Follow-up    Breathing has improved slightly. She has been out of brovana due to high cost x 3 days. She has had to use her albuterol inhaler and neb daily since then.   Dyspnea:  50 ft = About the same as usual Cough: after supper is the worse, but not much mucus Sleeping: wakes up coughing 30 degrees due to sensation of reflux    SABA use: last 5 h prior to OV    02: none  Sleeping at 30 degrees    rec Plan A = Automatic = Performist 20 mcg one vial  with pulmocort one half vial twice daily  Gabapentin 100 mg three times daily Nexium Take 30- 60 min before your first and last meals of the day and zantac (ranitidine) at bedtime  Along with chlorpheniramine 4 mg 1 or 2 also at bedtime (over the counter)  Continue prednisone 10 mg every day  Plan B = Backup for breathing Only use your albuterol as a rescue medication  Plan B = Backup for coughing  Add tramadol 50 mg 1-2 every 4 hours if needed  Plan C = Crisis - only use your albuterol nebulizer if you first try Plan B and it fails to help > ok to use the nebulizer up to every 4 hours but if start needing it regularly call for immediate appointment     03/11/2018  f/u ov/Stina Gane re: 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 Follow-up    Cough had improved and then started back 1 day ago.  Cough is now non prod.  She is using her albuterol inhaler 2 x daily on average. She has not needed albuterol neb.    Dyspnea:  MMRC2 = can't walk a nl pace on a flat grade s sob but does fine slow and flat slowed by knee  Cough: noct > day  Sleeping: 45 degrees due to subjective reflux despite max acid suppresion  SABA use: as above - never noct 02: none   rec Gabapentin 100 mg four  times daily  Take chortab 4 mg x 2 at bedtime and up to every 4 hours during the day as needed for drainage  Take delsym two tsp every 12 hours and supplement if needed with  tramadol 50 mg up to 2 every 4 hours to suppress the urge to cough. Swallowing water and/or using ice chips/non mint and menthol containing candies (such as lifesavers or sugarless jolly ranchers) are also effective.  You should rest your voice and avoid activities that you know make you cough. Once you have eliminated the cough for 3 straight days try reducing the tramadol first,  then the delsym as tolerated.  Plan D = Prednisone x 6 days  if not cough/ sob not better See calendar for specific medication instructions and bring it back for  each and every office visit for every healthcare provider you see.  Without it,  you may not receive the best quality medical care that we feel you deserve. Please schedule a follow up office visit in 4 weeks, call sooner if needed with all medications /inhalers/ solutions in hand so we can verify exactly what you are taking. This includes all medications from all doctors and over the [place] separate them into two bags:  the ones you take automatically, no matter what, vs the ones you take just when you feel you need them "BAG #2 is UP TO YOU"  - this will really help [place] help you take your medications more effectively.    03/15/18 back to ER - did not activate action plan prior (never took more than 1 tramadol every 12 hours to control cough.  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 Follow-up    recent admission 03/15/18- pt reports of wheezing, sob with exertion, no prod cough & chest tightness.   Dyspnea:  MMRC4  = sob if tries to leave home or while getting dressed   - no better now vs during hosp stay. Seen by Dr Lake Bells who strongly supported vcd dx during admit Cough: dry cough settles down overnight and with plb as does the "wheeze"  Sleeping: 45 degrees SABA use: last neb 8 h prior to OV  "did not help" 02: no  rec Increase gabapentin to 100 mg take 2 (=200)  four times daily as per med calendar For cough > delsym 2 tsp every 12 hours with use of flutter valve as much as possible  and supplement with tramadol 50 mg up to 1-2 every 4 hours as needed  For breathing problems not improving with use of nebulizer > Prednisone 10 mg take  4 each am x 2 days,   2 each am x 2 days,  1 each am x 2 days and stop  GERD diet / lifestyle recs Keep previous appt with your medications in 2 bags     04/05/2018  f/u ov/Naif Alabi re: uacs/ VCD - brought all meds but confused with gen vs trade names, could not tell me how she is supposed to take esmeprazole when I handed it to her.  Chief Complaint  Patient presents with  . Follow-up  Dyspnea:  50 ft  Cough: better  Sleeping: 45 degrees SABA use: twice a day hfa , rare neb 02: none   rec Gabapentin 300 mg four times daily  Budesonide 0.25 mcg twice daily with performist If breathing worse > Prednisone 10 mg take  4 each am x 2 days,   2 each am x 2 days,  1 each am x 2 days and stop  For cough > Take delsym two tsp every 12 hours and flutter valve as much as possible supplement if needed with  tramadol 50 mg (or tylenol #3) up to 2 every 4 hours to suppress the urge to cough. Swallowing water and/or using ice chips/non mint and menthol containing candies (such as lifesavers or sugarless jolly ranchers) are also effective.  You should rest your voice and avoid activities that you know make you cough. Once you have eliminated the cough for 3 straight days try reducing the tramadol first,  then the delsym as tolerated. Nexium (esmaprazole)  Take 30- 60 min before your first and last meals of  the day  Please schedule a follow up office visit in 2  weeks, sooner if needed - needs full pfts on return      04/18/2018  f/u ov/Tineka Uriegas re: asthma vs vcd  - brought most but not all her meds - missing nexium, has budesonide in prn bag/ pill organizer has 2 slots per day and hard to read to top print on each slot Chief Complaint  Patient presents with  . Follow-up    PFT's done today. She c/o increased SOB for the past 3 days. She is using her albuterol inhaler and neb with albuterol both about 2 x per day.   Dyspnea:  Improved, able to do [employer] shopping now  Cough: varies s pattern, min mucoid  Sleeping: 45 degrees all pillows  SABA use: twice daily due to overdoing it at work 02: none   rec Continue brovana and budesonide Take the prednisone if  getting worse See [name] NP w/in 4  weeks with all your medications     05/27/2018 much better but admitted not c/w neb bud/ perfomist and too sleepy from gabapentin 300 bid but no longer coughing  rec Continue on Budesonide and Perforomist Neb Twice daily, rinse after use.  Decrease Gabapentin 300 mg At bedtime  .  Follow med calendar closely and bring to each visit      Virtual Visit via Telephone Note 08/01/2018   I connected with [name] on 08/01/18 at  4:00 PM EDT by telephone and verified that I am speaking with the correct person using two identifiers.   I discussed the limitations, risks, security and privacy concerns of performing an evaluation and management service by telephone and the availability of in person appointments. I also discussed with the patient that there may be a patient responsible charge related to this service. The patient expressed understanding and agreed to proceed.   History of Present Illness: Doing fine until am 08/01/2018 on q am bud 0.5 / performist but legs more swollen than usual x 24 h and chronically not on hs h2 as rec  The woke up at usual time this am legs still swollen and wt up 4lb over baseline and sob at rest/ harsh dry cough > no resp to neb so 911 > bp 149/100 and took 4 nebs   Dyspnea:  Better / wheezing in throat only Cough: harsh but dry  Sleeping: fine 45 degrees with pillows  until woke up this am sob at usual time  SABA use: not using at all until this am  02: none  rec Plan A = automatic >>>   When not doing great you need to be taking your budesonide/perforomist twice daily as per med calendar, not once daily  Add back pepcid (famotidine) 20 mg at bedtime as per med calendar If needing more albuterol than usual, immediately start on Plan D as per med calendar=  prednisone  10 mg take  4 each am x 2 days,   2 each am x 2 days,  1 each am x 2 days and stop and be sure you are taking the plan A meds as above twice daily  For  cough use flutter valve as much as needed    Virtual Visit via Telephone Note 08/30/2018 re 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  I discussed the limitations, risks, security and privacy concerns of performing an evaluation and management service by telephone and the availability of in person appointments. I also discussed with the patient that there may be a patient responsible charge related to this service. The patient expressed understanding and agreed to proceed.   History of Present Illness: Continuing bid / pepcid / finished and no worse since stopped prednisone Dyspnea:  Doing yardwork Cough: none Sleeping: 45 degrees hob  SABA use: has ventolin / proair not needing 02: no    No obvious day to day or daytime variability or assoc excess/ purulent sputum or mucus plugs or hemoptysis or cp or chest tightness, subjective wheeze or overt sinus or hb symptoms.    Also denies any obvious fluctuation of symptoms with weather or environmental changes or other aggravating or alleviating factors except as outlined above.   Meds reviewed/ med reconciliation completed         Observations/Objective: Positive outlook / good phonation / full sentences    Assessment and Plan: See problem list for active a/p's   Follow Up Instructions: See avs for instructions unique to this ov which includes revised/ updated med list     I discussed the assessment and treatment plan with the patient. The patient was provided an opportunity to ask questions and all were answered. The patient agreed with the plan and demonstrated an understanding of the instructions.   The patient was advised to call back or seek an in-person evaluation if the symptoms worsen or if the condition fails to improve as anticipated.  I provided 25  minutes of non-face-to-face  time during this encounter.   Christinia Gully, MD

## 2018-09-18 ENCOUNTER — Other Ambulatory Visit: Payer: Self-pay | Admitting: Nurse Practitioner

## 2018-09-20 ENCOUNTER — Telehealth: Payer: Self-pay

## 2018-09-20 DIAGNOSIS — J4530 Mild persistent asthma, uncomplicated: Secondary | ICD-10-CM

## 2018-09-20 NOTE — Telephone Encounter (Signed)
Call returned to patient, she states she needs a a new nebulizer machine. She states when she plugs hers in it no longer cuts on. She states she has it for years. Per last AVS patient is to continue her nebulizer medications. Order for nebulizer machine. Voiced understanding. Nothing further is needed at this time.

## 2018-09-25 ENCOUNTER — Other Ambulatory Visit: Payer: Self-pay | Admitting: Nurse Practitioner

## 2018-10-14 ENCOUNTER — Other Ambulatory Visit: Payer: Self-pay

## 2018-10-14 ENCOUNTER — Emergency Department (HOSPITAL_COMMUNITY): Payer: Medicare Other

## 2018-10-14 ENCOUNTER — Encounter: Payer: Self-pay | Admitting: Internal Medicine

## 2018-10-14 ENCOUNTER — Emergency Department (HOSPITAL_COMMUNITY)
Admission: EM | Admit: 2018-10-14 | Discharge: 2018-10-14 | Disposition: A | Discharge status: Home / Self Care | Payer: Medicare Other | Attending: Emergency Medicine | Admitting: Emergency Medicine

## 2018-10-14 ENCOUNTER — Ambulatory Visit (INDEPENDENT_AMBULATORY_CARE_PROVIDER_SITE_OTHER): Payer: Medicare Other | Admitting: Internal Medicine

## 2018-10-14 ENCOUNTER — Encounter (HOSPITAL_COMMUNITY): Payer: Self-pay

## 2018-10-14 DIAGNOSIS — Z7984 Long term (current) use of oral hypoglycemic drugs: Secondary | ICD-10-CM | POA: Insufficient documentation

## 2018-10-14 DIAGNOSIS — N183 Chronic kidney disease, stage 3 (moderate): Secondary | ICD-10-CM | POA: Insufficient documentation

## 2018-10-14 DIAGNOSIS — Z7982 Long term (current) use of aspirin: Secondary | ICD-10-CM | POA: Insufficient documentation

## 2018-10-14 DIAGNOSIS — R569 Unspecified convulsions: Secondary | ICD-10-CM | POA: Diagnosis not present

## 2018-10-14 DIAGNOSIS — Y939 Activity, unspecified: Secondary | ICD-10-CM | POA: Diagnosis not present

## 2018-10-14 DIAGNOSIS — Z79899 Other long term (current) drug therapy: Secondary | ICD-10-CM | POA: Diagnosis not present

## 2018-10-14 DIAGNOSIS — J4530 Mild persistent asthma, uncomplicated: Secondary | ICD-10-CM | POA: Insufficient documentation

## 2018-10-14 DIAGNOSIS — Y929 Unspecified place or not applicable: Secondary | ICD-10-CM | POA: Insufficient documentation

## 2018-10-14 DIAGNOSIS — T148XXA Other injury of unspecified body region, initial encounter: Secondary | ICD-10-CM | POA: Insufficient documentation

## 2018-10-14 DIAGNOSIS — Z87891 Personal history of nicotine dependence: Secondary | ICD-10-CM | POA: Diagnosis not present

## 2018-10-14 DIAGNOSIS — I2510 Atherosclerotic heart disease of native coronary artery without angina pectoris: Secondary | ICD-10-CM | POA: Insufficient documentation

## 2018-10-14 DIAGNOSIS — E1142 Type 2 diabetes mellitus with diabetic polyneuropathy: Secondary | ICD-10-CM | POA: Insufficient documentation

## 2018-10-14 DIAGNOSIS — W19XXXA Unspecified fall, initial encounter: Secondary | ICD-10-CM | POA: Insufficient documentation

## 2018-10-14 DIAGNOSIS — Y999 Unspecified external cause status: Secondary | ICD-10-CM | POA: Diagnosis not present

## 2018-10-14 DIAGNOSIS — E1122 Type 2 diabetes mellitus with diabetic chronic kidney disease: Secondary | ICD-10-CM | POA: Diagnosis not present

## 2018-10-14 DIAGNOSIS — I5032 Chronic diastolic (congestive) heart failure: Secondary | ICD-10-CM | POA: Diagnosis not present

## 2018-10-14 DIAGNOSIS — T07XXXA Unspecified multiple injuries, initial encounter: Secondary | ICD-10-CM

## 2018-10-14 DIAGNOSIS — I252 Old myocardial infarction: Secondary | ICD-10-CM | POA: Insufficient documentation

## 2018-10-14 DIAGNOSIS — I130 Hypertensive heart and chronic kidney disease with heart failure and stage 1 through stage 4 chronic kidney disease, or unspecified chronic kidney disease: Secondary | ICD-10-CM | POA: Diagnosis not present

## 2018-10-14 MED ORDER — METHOCARBAMOL 500 MG PO TABS: 500.0000 mg | ORAL_TABLET | Freq: Two times a day (BID) | ORAL | 0 refills | Status: DC

## 2018-10-14 MED ORDER — HYDROCODONE-ACETAMINOPHEN 5-325 MG PO TABS: 1.0000 | ORAL_TABLET | ORAL | 0 refills | Status: DC | PRN

## 2018-10-14 NOTE — ED Triage Notes (Signed)
Patient fell 2 days ago on cement floor in garage. Patient c/o right leg pain from ankle up to knee.   10/10 dull pain   Patient sent by PCP.    A/ox4 Ambulatory in triage.

## 2018-10-14 NOTE — ED Provider Notes (Signed)
[facility] DEPT Provider Note   CSN: [id number] Arrival date & time: 10/14/18  1124     History   Chief Complaint Chief Complaint  Patient presents with  . Fall    HPI [name] is a 68 y.o. female.     68 year old female presents with complaints of pain to the right side of her body after mechanical fall.  No LOC.  Notes dull pain worse with movement to her right ribs, right elbow, right hip, right knee.  Denies any abdominal discomfort.  She has not been short of breath.  Has not had any emesis.  Symptoms better with remaining still no treatment used prior to arrival.     Past Medical History:  Diagnosis Date  . Anxiety   . Asthma   . Bronchitis   . Chronic diastolic CHF (congestive heart failure) ([place])   . Chronic diastolic CHF (congestive heart failure) ([place]) 07/17/2016  . Coronary artery disease    a. NSTEMI 12/2015 - LHC 01/18/16: s/p overlapping DESx2 to RCA, 10% ost-prox Cx,10% mLAD.  [place] Kitchen Depression   . Diabetes mellitus   . Dyslipidemia   . GERD (gastroesophageal reflux disease)   . Heart attack ([place])   . Hypertension   . Hypertensive heart disease   . NSTEMI (non-ST elevated myocardial infarction) ([place]) 12/2015  . Pneumonia     Patient Active Problem List   Diagnosis Date Noted  . Severe persistent asthma without complication 82/99/3716  . DM2 (diabetes mellitus, type 2) ([place]) 03/15/2018  . CKD (chronic kidney disease), stage III ([place]) 03/15/2018  . Dyspnea 08/12/2017  . Macrocytic anemia 08/12/2017  . Therapeutic drug monitoring 05/17/2017  . Lung nodule 08/01/2016  . Influenza B 07/18/2016  . Chronic diastolic CHF (congestive heart failure) ([place]) 07/17/2016  . Acute renal failure superimposed on stage 3 chronic kidney disease ([place]) 07/17/2016  . Right leg swelling 02/27/2016  . Acute respiratory failure with hypoxia ([place])   . Uncontrolled type 2 diabetes mellitus with complication ([place])   . Acute pulmonary embolism  ([place])   . HLD (hyperlipidemia)   . Seizures ([place]) 01/27/2016  . Numbness 01/27/2016  . CAD in native artery 01/20/2016  . Hypertensive heart disease 01/20/2016  . NSTEMI (non-ST elevated myocardial infarction) ([place]) 01/18/2016  . Acute bronchitis with asthma with acute exacerbation 01/16/2016  . Hypokalemia 01/16/2016  . Hypomagnesemia 01/16/2016  . Acute respiratory failure ([place]) 01/16/2016  . Dyspnea on exertion   . Asthma exacerbation 07/21/2015  . Upper airway cough syndrome with VCD 08/28/2014  . Diabetes mellitus type 2, controlled ([place]) 08/06/2014  . Hyperlipidemia 08/06/2014  . Allergic rhinitis 07/22/2008  . 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 DYSPNEA 07/22/2008  . Diabetes mellitus type 2, uncontrolled ([place]) 07/21/2008  . DIABETIC PERIPHERAL NEUROPATHY 07/21/2008  . Depression with anxiety 07/21/2008  . Essential hypertension 07/21/2008  . Mild persistent chronic asthma without complication 96/78/9381  . G E R D 07/21/2008  . PULMONARY EMBOLISM, HX OF 07/21/2008    Past Surgical History:  Procedure Laterality Date  . ABDOMINAL HYSTERECTOMY    . CARDIAC CATHETERIZATION  1994   minimal LAD dz, no other dz, EF normal  . CARDIAC CATHETERIZATION N/A 01/18/2016   Procedure: Left Heart Cath and Coronary Angiography;  Surgeon: Burnell Blanks, MD;  Location: [facility];  Service: Cardiovascular;  Laterality: N/A;  . CARDIAC CATHETERIZATION N/A 01/18/2016   Procedure: Coronary Stent Intervention;  Surgeon: Burnell Blanks, MD;  Location: [facility];  Service: Cardiovascular;  Laterality: N/A;  . COLONOSCOPY WITH PROPOFOL N/A 04/09/2014   Procedure: COLONOSCOPY WITH PROPOFOL;  Surgeon: Cleotis Nipper, MD;  Location: [facility];  Service: Endoscopy;  Laterality: N/A;  . CORONARY STENT PLACEMENT  01/19/2016   STENT SYNERGY DES 9.52W41 drug eluting stent was successfully placed, and overlaps the 2.5 x 38 mm Synergy stent placed distally.   . ESOPHAGOGASTRODUODENOSCOPY (EGD) WITH PROPOFOL N/A 04/09/2014   Procedure: ESOPHAGOGASTRODUODENOSCOPY (EGD) WITH PROPOFOL;  Surgeon: Cleotis Nipper, MD;  Location: [facility];  Service: Endoscopy;  Laterality: N/A;  . HEMORRHOID SURGERY    . HERNIA REPAIR    . KNEE ARTHROSCOPY       OB History   No obstetric history on file.      Home Medications    Prior to Admission medications   Medication Sig Start Date End Date Taking? Authorizing Provider  albuterol (PROAIR HFA) 108 (90 Base) MCG/ACT inhaler Inhale 2 puffs into the lungs every 4 (four) hours as needed for wheezing or shortness of breath. 06/29/17   Hayden Rasmussen, MD  albuterol (PROVENTIL) (2.5 MG/3ML) 0.083% nebulizer solution Take 2.5 mg by nebulization every 4 (four) hours as needed for wheezing or shortness of breath (((PLAN C))).    [provider]  ALPRAZolam Duanne Moron) 0.5 MG tablet Take 1 tablet (0.5 mg total) by mouth 2 (two) times daily as needed for anxiety. 07/31/15   Theodis Blaze, MD  aspirin EC 81 MG tablet Take 81 mg by mouth daily.     [provider]  atorvastatin (LIPITOR) 40 MG tablet Take 40 mg by mouth at bedtime.    [provider]  Biotin 5000 MCG CAPS Take 1 capsule by mouth daily.    [provider]  budesonide (PULMICORT) 0.25 MG/2ML nebulizer solution One vial twice daily 04/05/18   Tanda Rockers, MD  BYSTOLIC 2.5 MG tablet TAKE 1 TABLET EVERY DAY 09/18/18   Burtis Junes, NP  chlorpheniramine (CHLOR-TRIMETON) 4 MG tablet Take 4 mg by mouth every 4 (four) hours as needed for allergies.    [provider]  dextromethorphan (DELSYM) 30 MG/5ML liquid 2 tsp every 12 hours as needed w/ flutter    [provider]  diltiazem (CARTIA XT) 240 MG 24 hr capsule Take 1 capsule (240 mg total) by mouth daily. Please make annual appt for future refills. Thank you 09/25/18   Burtis Junes, NP  divalproex (DEPAKOTE ER) 500 MG 24 hr tablet Take 2 tablets  (1,000 mg total) by mouth at bedtime. 05/20/18   Dennie Bible, NP  esomeprazole (NEXIUM) 20 MG capsule Take 20 mg by mouth 2 (two) times daily before a meal.    [provider]  estradiol (ESTRACE) 1 MG tablet Take 1 mg by mouth daily.     [provider]  fluticasone (FLONASE) 50 MCG/ACT nasal spray Place 2 sprays into both nostrils daily as needed for allergies or rhinitis.    [provider]  formoterol (PERFOROMIST) 20 MCG/2ML nebulizer solution Take 2 mLs (20 mcg total) by nebulization 2 (two) times daily. J41.0 12/28/17   Tanda Rockers, MD  gabapentin (NEURONTIN) 300 MG capsule Take 300 mg by mouth at bedtime.    [provider]  hydrALAZINE (APRESOLINE) 25 MG tablet Take 1 tablet (25 mg total) by mouth 2 (two) times daily. 09/17/17   Burtis Junes, NP  metFORMIN (GLUCOPHAGE) 500 MG tablet Take 500 mg  by mouth 2 (two) times daily with a meal.    [provider]  nitroGLYCERIN (NITROSTAT) 0.4 MG SL tablet Place 1 tablet (0.4 mg total) under the tongue every 5 (five) minutes as needed for chest pain. x3 as needed 09/17/17   Burtis Junes, NP  potassium chloride SA (K-DUR,KLOR-CON) 20 MEQ tablet Take 20 mEq by mouth daily.     [provider]  predniSONE (DELTASONE) 10 MG tablet Take  4 each am x 2 days,   2 each am x 2 days,  1 each am x 2 days and stop Patient not taking: Reported on 10/14/2018 08/01/18   Tanda Rockers, MD  sodium chloride (OCEAN) 0.65 % SOLN nasal spray Place 1 spray into both nostrils as needed for congestion.    [provider]  torsemide (DEMADEX) 20 MG tablet Take 20-60 mg by mouth daily. 1-3 tabs (20mg  -60mg ) by mouth once daily 10/25/16   [provider]    Family History Family History  Problem Relation Age of Onset  . Allergies Mother   . Asthma Mother   . CAD Father 73  . Diabetes Father   . Hypertension Father   . Congestive Heart Failure Sister        died in her 6s  . CAD  Sister 66    Social History Social History   Tobacco Use  . Smoking status: Former Smoker    Packs/day: 1.00    Years: 30.00    Pack years: 30.00    Types: Cigarettes    Quit date: 01/09/2004    Years since quitting: 14.7  . Smokeless tobacco: Never Used  Substance Use Topics  . Alcohol use: No    Alcohol/week: 0.0 standard drinks  . Drug use: No     Allergies   Patient has no known allergies.   Review of Systems Review of Systems  All other systems reviewed and are negative.    Physical Exam Updated Vital Signs BP (!) 166/82 (BP Location: Left Arm)   Pulse 66   Temp 98 F (36.7 C) (Oral)   Resp 18   LMP  (LMP Unknown)   SpO2 99%   Physical Exam Vitals signs and nursing note reviewed.  Constitutional:      General: She is not in acute distress.    Appearance: Normal appearance. She is well-developed. She is not toxic-appearing.  HENT:     Head: Normocephalic and atraumatic.  Eyes:     General: Lids are normal.     Conjunctiva/sclera: Conjunctivae normal.     Pupils: Pupils are equal, round, and reactive to light.  Neck:     Musculoskeletal: Normal range of motion and neck supple.     Thyroid: No thyroid mass.     Trachea: No tracheal deviation.  Cardiovascular:     Rate and Rhythm: Normal rate and regular rhythm.     Heart sounds: Normal heart sounds. No murmur. No gallop.   Pulmonary:     Effort: Pulmonary effort is normal. No respiratory distress.     Breath sounds: Normal breath sounds. No stridor. No decreased breath sounds, wheezing, rhonchi or rales.  Chest:    Abdominal:     General: Bowel sounds are normal. There is no distension.     Palpations: Abdomen is soft.     Tenderness: There is no abdominal tenderness. There is no rebound.  Musculoskeletal:        General: No tenderness.     Right  elbow: She exhibits decreased range of motion. She exhibits no swelling and no effusion.     Right hip: She exhibits decreased range of motion.      Right knee: She exhibits decreased range of motion. She exhibits no effusion.  Skin:    General: Skin is warm and dry.     Findings: No abrasion or rash.  Neurological:     Mental Status: She is alert and oriented to person, place, and time.     GCS: GCS eye subscore is 4. GCS verbal subscore is 5. GCS motor subscore is 6.     Cranial Nerves: No cranial nerve deficit.     Sensory: No sensory deficit.  Psychiatric:        Speech: Speech normal.        Behavior: Behavior normal.      ED Treatments / Results  Labs (all labs ordered are listed, but only abnormal results are displayed) Labs Reviewed - No data to display  EKG    Radiology No results found.  Procedures Procedures (including critical care time)  Medications Ordered in ED Medications - No data to display   Initial Impression / Assessment and Plan / ED Course  I have reviewed the triage vital signs and the nursing notes.  Pertinent labs & imaging results that were available during my care of the patient were reviewed by me and considered in my medical decision making (see chart for details).        Patient's x-ray studies here are negative.  Will prescribe muscle relaxant and discharged home  Final Clinical Impressions(s) / ED Diagnoses   Final diagnoses:  None    ED Discharge Orders    None       Lacretia Leigh, MD 10/14/18 1351

## 2018-10-14 NOTE — Progress Notes (Signed)
Subjective:   Patient ID: [name], female    DOB: 17-Dec-1950    MRN: [id number]    Brief patient profile:  13 yobf quit smoking 2005 with nl pfts 2012  with recurrent non-specific resp flares with nl pfts during flares c/w pseudoasthma.     History of Present Illness  01/24/2016  Extended post hosp f/u ov/ transition of care/ /[name] re: dtca asthma/ vcd symb 160 and pred 5 x 10 mg daily  Chief Complaint  Patient presents with   Follow-up    Pt. recently got [facility] the [facility] for an asthma attack, Pt. states her breathing remains the same, coughing with some yellow to white mucus,Has been needing to use Ventolin more often  last doing well 2 months prior to OV   While on Symb 160/ gerd rx but not dosing ac as rec  Waking up prematurely x 2 months with cough/wheeze/ sob but never contacted me as per the instructions rec Plan A = Automatic = dulera 100 Take 2 puffs first thing in am and then another 2 puffs about 12 hours later.                                      Nexum 40 mg Take 30-60 min before first meal of the day and Pepcid AC 20 mg  at bedtime Work on inhaler technique  Plan B = Backup Only use your albuterol as a rescue medication Plan C = Crisis - only use your albuterol nebulizer if you first try Plan B and it fails to help > ok to use the nebulizer up to every 4 hours but if start needing it regularly call for immediate appointment GERD diet  Take delsym two tsp every 12 hours and use the flutter valve as much as possible and supplement if needed with  tramadol 50 mg up to 2 every 4 hours to suppress the urge to cough  Once you have eliminated the cough for 3 straight days try reducing the tramadol first,  then the delsym as tolerated.   Prednisone 5 mg x 8, then 6, then 4 then 2, then 1 daily x 2 days and off  please schedule a follow up office visit in 2 weeks, sooner if needed with all meds/ inhalers/ neb solutions in hand     09/18/2016  f/u ov/[name] re:  UACS vs  Asthma brought med calendar but not updated / on symb 160 / max gerd rx  Chief Complaint  Patient presents with   Follow-up    Pt c/o occ wheezing. She has not used rescue inhaler or neb recently.   still feels wheezing  And doe = MMRC1 = can walk nl pace, flat grade, can't hurry or go uphills or steps s sob rec Change the symbicort to 80 Take 2 puffs first thing in am and then another 2 puffs about 12 hours later.  Work on inhaler technique:  Use the med calendar daily to organize your medications as a "checklist" See [name] NP in 4  weeks with all your medications> never happened    Admit date: 12/19/2017 Discharge date: 12/24/2017  D/c home with steroids taper, monitor blood glucose while on steroids She reports run out of pulmicort nebs, new prescription provided, she is to follow up with Dr Melvyn Novas to further discuss asthma/copd meds, she is concerned about cost of these meds  Discharge Diagnoses:  Active Hospital Problems   Diagnosis Date Noted   Acute bronchitis with asthma with acute exacerbation 01/16/2016   Acute renal failure superimposed on stage 3 chronic kidney disease (HCC) 07/17/2016   Chronic diastolic CHF (congestive heart failure) ([place]) 07/17/2016   Acute respiratory failure with hypoxia (HCC)    Upper airway cough syndrome vs Asthma  08/28/2014   Diabetes mellitus type 2, controlled ([place]) 08/06/2014   Essential hypertension 07/21/2008           12/28/2017 ext post hosp f/u ov/[name] re: confused with meds again / only slightly better breathing  Chief Complaint  Patient presents with   Follow-up    Breathing has improved slightly. She has been out of brovana due to high cost x 3 days. She has had to use her albuterol inhaler and neb daily since then.   Dyspnea:  50 ft = About the same as usual Cough: after supper is the worse, but not much mucus Sleeping: wakes up coughing 30 degrees due to sensation of reflux    SABA use: last 5 h prior to OV     02: none  Sleeping at 30 degrees    rec Plan A = Automatic = Performist 20 mcg one vial  with pulmocort one half vial twice daily  Gabapentin 100 mg three times daily Nexium Take 30- 60 min before your first and last meals of the day and zantac (ranitidine) at bedtime  Along with chlorpheniramine 4 mg 1 or 2 also at bedtime (over the counter)  Continue prednisone 10 mg every day  Plan B = Backup for breathing Only use your albuterol as a rescue medication  Plan B = Backup for coughing  Add tramadol 50 mg 1-2 every 4 hours if needed  Plan C = Crisis - only use your albuterol nebulizer if you first try Plan B and it fails to help > ok to use the nebulizer up to every 4 hours but if start needing it regularly call for immediate appointment     03/11/2018  f/u ov/[name] re: pseudoasthma > asthma Chief Complaint  Patient presents with   Follow-up    Cough had improved and then started back 1 day ago.  Cough is now non prod.  She is using her albuterol inhaler 2 x daily on average. She has not needed albuterol neb.    Dyspnea:  MMRC2 = can't walk a nl pace on a flat grade s sob but does fine slow and flat slowed by knee  Cough: noct > day  Sleeping: 45 degrees due to subjective reflux despite max acid suppresion  SABA use: as above - never noct 02: none   rec Gabapentin 100 mg four  times daily  Take chortab 4 mg x 2 at bedtime and up to every 4 hours during the day as needed for drainage  Take delsym two tsp every 12 hours and supplement if needed with  tramadol 50 mg up to 2 every 4 hours to suppress the urge to cough. Swallowing water and/or using ice chips/non mint and menthol containing candies (such as lifesavers or sugarless jolly ranchers) are also effective.  You should rest your voice and avoid activities that you know make you cough. Once you have eliminated the cough for 3 straight days try reducing the tramadol first,  then the delsym as tolerated.  Plan D = Prednisone x 6  days if not cough/ sob not better See calendar for specific medication instructions and bring it back  for each and every office visit for every healthcare provider you see.  Without it,  you may not receive the best quality medical care that we feel you deserve. Please schedule a follow up office visit in 4 weeks, call sooner if needed with all medications /inhalers/ solutions in hand so we can verify exactly what you are taking. This includes all medications from all doctors and over the [place] separate them into two bags:  the ones you take automatically, no matter what, vs the ones you take just when you feel you need them "BAG #2 is UP TO YOU"  - this will really help [place] help you take your medications more effectively.    03/15/18 back to ER - did not activate action plan prior (never took more than 1 tramadol every 12 hours to control cough.   03/20/2018  Extended post hops  f/u ov/[name] re: transition of care re   vcd vs asthma  Chief Complaint  Patient presents with   Follow-up    recent admission 03/15/18- pt reports of wheezing, sob with exertion, no prod cough & chest tightness.   Dyspnea:  MMRC4  = sob if tries to leave home or while getting dressed   - no better now vs during hosp stay. Seen by Dr [place] Bells who strongly supported vcd dx during admit Cough: dry cough settles down overnight and with plb as does the "wheeze"  Sleeping: 45 degrees SABA use: last neb 8 h prior to OV  "did not help" 02: no  rec Increase gabapentin to 100 mg take 2 (=200)  four times daily as per med calendar For cough > delsym 2 tsp every 12 hours with use of flutter valve as much as possible  and supplement with tramadol 50 mg up to 1-2 every 4 hours as needed  For breathing problems not improving with use of nebulizer > Prednisone 10 mg take  4 each am x 2 days,   2 each am x 2 days,  1 each am x 2 days and stop  GERD diet / lifestyle recs Keep previous appt with your medications in 2 bags     04/05/2018  f/u ov/[name] re: uacs/ VCD - brought all meds but confused with gen vs trade names, could not tell me how she is supposed to take esmeprazole when I handed it to her.  Chief Complaint  Patient presents with   Follow-up  Dyspnea:  50 ft  Cough: better  Sleeping: 45 degrees SABA use: twice a day hfa , rare neb 02: none   rec Gabapentin 300 mg four times daily  Budesonide 0.25 mcg twice daily with performist If breathing worse > Prednisone 10 mg take  4 each am x 2 days,   2 each am x 2 days,  1 each am x 2 days and stop  For cough > Take delsym two tsp every 12 hours and flutter valve as much as possible supplement if needed with  tramadol 50 mg (or tylenol #3) up to 2 every 4 hours to suppress the urge to cough. Swallowing water and/or using ice chips/non mint and menthol containing candies (such as lifesavers or sugarless jolly ranchers) are also effective.  You should rest your voice and avoid activities that you know make you cough. Once you have eliminated the cough for 3 straight days try reducing the tramadol first,  then the delsym as tolerated. Nexium (esmaprazole)  Take 30- 60 min before your first and last  meals of the day  Please schedule a follow up office visit in 2  weeks, sooner if needed - needs full pfts on return      04/18/2018  f/u ov/[name] re: asthma vs vcd  - brought most but not all her meds - missing nexium, has budesonide in prn bag/ pill organizer has 2 slots per day and hard to read to top print on each slot Chief Complaint  Patient presents with   Follow-up    PFT's done today. She c/o increased SOB for the past 3 days. She is using her albuterol inhaler and neb with albuterol both about 2 x per day.   Dyspnea:  Improved, able to do [employer] shopping now  Cough: varies s pattern, min mucoid  Sleeping: 45 degrees all pillows  SABA use: twice daily due to overdoing it at work 02: none     10/14/2018  f/u ov/[name] re: asthma with vcd component   Chief Complaint  Patient presents with   Follow-up    Breathing is overall doing well. She has not had to use her abuterol inhaler or neb.  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   Sleeping as above without nocturnal  or early am exacerbation  of respiratory  c/o's or need for noct saba. Also denies any obvious fluctuation of symptoms with weather or environmental changes or other aggravating or alleviating factors except as outlined above   No unusual exposure hx or h/o childhood pna/ asthma or knowledge of premature birth.  Current Allergies, Complete Past Medical History, Past Surgical History, Family History, and Social History were reviewed in [employer] record.  ROS  The following are not active complaints unless bolded Hoarseness, sore throat, dysphagia, dental problems, itching, sneezing,  nasal congestion or discharge of excess mucus or purulent secretions, ear ache,   fever, chills, sweats, unintended wt loss or wt gain, classically pleuritic or exertional cp,  orthopnea pnd or arm/hand swelling  or leg swelling, presyncope, palpitations, abdominal pain, anorexia, nausea, vomiting, diarrhea  or change in bowel habits or change in bladder habits, change in stools or change in urine, dysuria, hematuria,  rash, arthralgias= R leg pain p fell 2 d prior to OV  , visual complaints, headache, numbness, weakness or ataxia or problems with walking or coordination,  change in mood or  memory.        Current Meds  Medication Sig   albuterol (PROAIR HFA) 108 (90 Base) MCG/ACT inhaler Inhale 2 puffs into the lungs every 4 (four) hours as needed for wheezing or  shortness of breath.   albuterol (PROVENTIL) (2.5 MG/3ML) 0.083% nebulizer solution Take 2.5 mg by nebulization every 4 (four) hours as needed for wheezing or shortness of breath (((PLAN C))).   ALPRAZolam (XANAX) 0.5 MG tablet Take 1 tablet (0.5 mg total) by mouth 2 (two) times daily as needed for anxiety.   aspirin EC 81 MG tablet Take 81 mg by mouth daily.    atorvastatin (LIPITOR) 40 MG tablet Take 40 mg by mouth at bedtime.   Biotin 5000 MCG CAPS Take 1 capsule by mouth daily.   budesonide (PULMICORT) 0.25 MG/2ML nebulizer  solution One vial twice daily   BYSTOLIC 2.5 MG tablet TAKE 1 TABLET EVERY DAY   chlorpheniramine (CHLOR-TRIMETON) 4 MG tablet Take 4 mg by mouth every 4 (four) hours as needed for allergies.   dextromethorphan (DELSYM) 30 MG/5ML liquid 2 tsp every 12 hours as needed w/ flutter   diltiazem (CARTIA XT) 240 MG 24 hr capsule Take 1 capsule (240 mg total) by mouth daily. Please make annual appt for future refills. Thank you   divalproex (DEPAKOTE ER) 500 MG 24 hr tablet Take 2 tablets (1,000 mg total) by mouth at bedtime.   esomeprazole (NEXIUM) 20 MG capsule Take 20 mg by mouth 2 (two) times daily before a meal.   estradiol (ESTRACE) 1 MG tablet Take 1 mg by mouth daily.    fluticasone (FLONASE) 50 MCG/ACT nasal spray Place 2 sprays into both nostrils daily as needed for allergies or rhinitis.   formoterol (PERFOROMIST) 20 MCG/2ML nebulizer solution Take 2 mLs (20 mcg total) by nebulization 2 (two) times daily. J41.0   gabapentin (NEURONTIN) 300 MG capsule Take 300 mg by mouth at bedtime.   hydrALAZINE (APRESOLINE) 25 MG tablet Take 1 tablet (25 mg total) by mouth 2 (two) times daily.   metFORMIN (GLUCOPHAGE) 500 MG tablet Take 500 mg by mouth 2 (two) times daily with a meal.   nitroGLYCERIN (NITROSTAT) 0.4 MG SL tablet Place 1 tablet (0.4 mg total) under the tongue every 5 (five) minutes as needed for chest pain. x3 as needed   potassium chloride SA  (K-DUR,KLOR-CON) 20 MEQ tablet Take 20 mEq by mouth daily.    sodium chloride (OCEAN) 0.65 % SOLN nasal spray Place 1 spray into both nostrils as needed for congestion.   torsemide (DEMADEX) 20 MG tablet Take 20-60 mg by mouth daily. 1-3 tabs (20mg  -60mg ) by mouth once daily               Objective:   Physical Exam  02/23/2016   168 >  04/07/2016  168 > 05/08/2016    168 >  07/17/2016  162 > 09/18/2016    164  > 09/18/2017  160 > 12/28/2017 167  > 03/11/2018  164 > 03/20/2018   160 > 04/05/2018   171 > 04/18/2018  165 > 10/14/2018 161    Vital signs reviewed - Note on arrival 02 sats  100% on RA  amb bf minimal pseudowheeze with fvc, resolves with plm   HEENT: nl dentition, turbinates bilaterally, and oropharynx. Nl external ear canals without cough reflex   NECK :  without JVD/Nodes/TM/ nl carotid upstrokes bilaterally   LUNGS: no acc muscle use,  Nl contour chest which is clear to A and P bilaterally without cough on insp or exp maneuvers   CV:  RRR  no s3 or murmur or increase in P2, and no edema   ABD:  soft and nontender with nl inspiratory excursion in the supine position. No bruits or organomegaly appreciated, bowel sounds nl  MS:  Nl gait/ ext warm without deformities, calf tenderness, cyanosis or clubbing No obvious joint restrictions   SKIN: warm and dry without lesions    NEURO:  alert, approp, nl sensorium with  no motor or cerebellar deficits apparent.           CXR PA and Lateral:   10/14/2018 :    I personally reviewed images and agree with radiology impression as follows:   No acute changes / neg rib details

## 2018-10-14 NOTE — Patient Instructions (Addendum)
No change medications - be sure to take your am nebulizer treatment first thing in am.    See [name] NP in 3 months with all your medications, even over the counter meds, separated in two separate bags, the ones you take no matter what vs the ones you stop once you feel better and take only as needed when you feel you need them.  [name]  will generate for you a new user friendly medication calendar that will put [place] all on the same page re: your medication use.     Without this process, it simply isn't possible to assure that we are providing  your outpatient care  with  the attention to detail we feel you deserve.   If we cannot assure that you're getting that kind of care,  then we cannot manage your problem effectively from this clinic.  Once you have seen [name] and we are sure that we're all on the same page with your medication use she will arrange follow up with me.

## 2018-10-14 NOTE — ED Notes (Signed)
ED Provider at bedside. 

## 2018-10-20 ENCOUNTER — Encounter: Payer: Self-pay | Admitting: Internal Medicine

## 2018-10-20 NOTE — Assessment & Plan Note (Signed)
Onset of symptoms reported  2012 p quit smoking  2005  - FEN0 01/24/2016 = 13 with active symptoms on symb 160 2bid > changed to dulera 100  - Spirometry 02/23/2016  NO signifiant obstruction with active symptoms  - 02/23/2016  After extensive coaching HFA effectiveness =    75%  - FENO 02/23/2016  =  12   On dulera 100 2bid (unable to confirm adeherence as did not bring it - Singulair 10 mg daily added 02/23/2016 to dulera 100 2bid with 2 week sample only  (unable to verify she used it) - Allergy profile 02/23/16  >  Eos 0.0 /  IgE  87 pos  RAST  Grass/ trees  - 04/07/2016   symb 80 x one sample given - FENO 04/07/2016  =   43 ? Really on meds ?  - 05/08/2016  After extensive coaching HFA effectiveness =    75% > continue symb 80 2bid - 07/17/2016 added singulair maint   - FENO 09/18/2016  =   15 on symb 160 2bid > try 80 2bid  - flare late feb 2019 ? What maint rx she was taking but on admit "non-adherent per notes 08/12/17  - 09/18/2017  After extensive coaching inhaler device  effectiveness =    75% from a baseline of 50% > restart symb 80 2bid - FENO 09/18/2017  =   20 on symb 160  - Spirometry 09/18/2017  No obstruction at all with active wheeze   My best bet is she has a mild case of asthma and a major case of vcd but they are hard to tease apart.  On complex rx tha now includes laba/ics neb > All goals of chronic asthma control met including optimal function and elimination of symptoms with minimal need for rescue therapy.  Contingencies discussed in full including contacting this office immediately if not controlling the symptoms using the rule of two's.     Needs to remember to use the laba/ics neb 1st thing in am as already rec and f/u in 3 m  with all meds in hand using a trust but verify approach to confirm accurate Medication  Reconciliation The principal here is that until we are certain that the  patients are doing what we've asked, it makes no sense to ask them to do more.    I had  an extended discussion with the patient reviewing all relevant studies completed to date and  lasting 15 to 20 minutes of a 25 minute visit    Each maintenance medication was reviewed in detail including most importantly the difference between maintenance and prns and under what circumstances the prns are to be triggered using an action plan format that is not reflected in the computer generated alphabetically organized AVS but trather by a customized med calendar that reflects the AVS meds with confirmed 100% correlation.   In addition, Please see AVS for unique instructions that I personally wrote and verbalized to the the pt in detail and then reviewed with pt  by my nurse highlighting any  changes in therapy recommended at today's visit to their plan of care.

## 2018-10-25 ENCOUNTER — Ambulatory Visit (INDEPENDENT_AMBULATORY_CARE_PROVIDER_SITE_OTHER): Payer: Medicare Other

## 2018-10-25 ENCOUNTER — Ambulatory Visit: Payer: Medicare Other | Admitting: Podiatry

## 2018-10-25 ENCOUNTER — Encounter: Payer: Self-pay | Admitting: Podiatry

## 2018-10-25 ENCOUNTER — Other Ambulatory Visit: Payer: Self-pay

## 2018-10-25 VITALS — Temp 98.4°F

## 2018-10-25 DIAGNOSIS — S99921A Unspecified injury of right foot, initial encounter: Secondary | ICD-10-CM | POA: Diagnosis not present

## 2018-10-25 DIAGNOSIS — B351 Tinea unguium: Secondary | ICD-10-CM | POA: Diagnosis not present

## 2018-10-25 DIAGNOSIS — M79674 Pain in right toe(s): Secondary | ICD-10-CM | POA: Diagnosis not present

## 2018-10-25 DIAGNOSIS — S9031XA Contusion of right foot, initial encounter: Secondary | ICD-10-CM | POA: Diagnosis not present

## 2018-10-25 DIAGNOSIS — M79675 Pain in left toe(s): Secondary | ICD-10-CM

## 2018-10-25 NOTE — Progress Notes (Signed)
Subjective: [name] presents today with painful, thick toenails 1-5 b/l that she cannot cut and which interfere with daily activities.  Pain is aggravated when wearing enclosed shoe gear.  Patient has had 2 falls since her last visit. She fell on 10/14/2018 and injured her ribs. She was seen and treated in ER.  She fell again 2 days ago and states she cracked her right great toenail. She also has pain of the 1st MPJ laterally near the webspace with some swelling. It has not interferred with her ability to walk.  [name], MD is her PCP.  Last visit was 6 months ago.   Current Outpatient Medications:  .  albuterol (PROAIR HFA) 108 (90 Base) MCG/ACT inhaler, Inhale 2 puffs into the lungs every 4 (four) hours as needed for wheezing or shortness of breath., Disp: 1 Inhaler, Rfl: 5 .  albuterol (PROVENTIL) (2.5 MG/3ML) 0.083% nebulizer solution, Take 2.5 mg by nebulization every 4 (four) hours as needed for wheezing or shortness of breath (((PLAN C)))., Disp: , Rfl:  .  ALPRAZolam (XANAX) 0.5 MG tablet, Take 1 tablet (0.5 mg total) by mouth 2 (two) times daily as needed for anxiety., Disp: 30 tablet, Rfl: 0 .  aspirin EC 81 MG tablet, Take 81 mg by mouth daily. , Disp: , Rfl:  .  atorvastatin (LIPITOR) 40 MG tablet, Take 40 mg by mouth at bedtime., Disp: , Rfl:  .  Biotin 5000 MCG CAPS, Take 1 capsule by mouth daily., Disp: , Rfl:  .  budesonide (PULMICORT) 0.25 MG/2ML nebulizer solution, One vial twice daily, Disp: 120 mL, Rfl: 12 .  BYSTOLIC 2.5 MG tablet, TAKE 1 TABLET EVERY DAY, Disp: 90 tablet, Rfl: 0 .  chlorpheniramine (CHLOR-TRIMETON) 4 MG tablet, Take 4 mg by mouth every 4 (four) hours as needed for allergies., Disp: , Rfl:  .  dextromethorphan (DELSYM) 30 MG/5ML liquid, 2 tsp every 12 hours as needed w/ flutter, Disp: , Rfl:  .  diltiazem (CARTIA XT) 240 MG 24 hr capsule, Take 1 capsule (240 mg total) by mouth daily. Please make annual appt for future refills. Thank you, Disp:  90 capsule, Rfl: 0 .  divalproex (DEPAKOTE ER) 500 MG 24 hr tablet, Take 2 tablets (1,000 mg total) by mouth at bedtime., Disp: 60 tablet, Rfl: 11 .  esomeprazole (NEXIUM) 20 MG capsule, Take 20 mg by mouth 2 (two) times daily before a meal., Disp: , Rfl:  .  estradiol (ESTRACE) 1 MG tablet, Take 1 mg by mouth daily. , Disp: , Rfl:  .  fluticasone (FLONASE) 50 MCG/ACT nasal spray, Place 2 sprays into both nostrils daily as needed for allergies or rhinitis., Disp: , Rfl:  .  formoterol (PERFOROMIST) 20 MCG/2ML nebulizer solution, Take 2 mLs (20 mcg total) by nebulization 2 (two) times daily. J41.0, Disp: 250 mL, Rfl: 0 .  gabapentin (NEURONTIN) 300 MG capsule, Take 300 mg by mouth at bedtime., Disp: , Rfl:  .  hydrALAZINE (APRESOLINE) 25 MG tablet, Take 1 tablet (25 mg total) by mouth 2 (two) times daily., Disp: 180 tablet, Rfl: 3 .  HYDROcodone-acetaminophen (NORCO/VICODIN) 5-325 MG tablet, Take 1-2 tablets by mouth every 4 (four) hours as needed., Disp: 10 tablet, Rfl: 0 .  metFORMIN (GLUCOPHAGE) 500 MG tablet, Take 500 mg by mouth 2 (two) times daily with a meal., Disp: , Rfl:  .  methocarbamol (ROBAXIN) 500 MG tablet, Take 1 tablet (500 mg total) by mouth 2 (two) times daily., Disp: 20 tablet, Rfl: 0 .  nitroGLYCERIN (NITROSTAT) 0.4 MG SL tablet, Place 1 tablet (0.4 mg total) under the tongue every 5 (five) minutes as needed for chest pain. x3 as needed, Disp: 25 tablet, Rfl: 6 .  potassium chloride SA (K-DUR,KLOR-CON) 20 MEQ tablet, Take 20 mEq by mouth daily. , Disp: , Rfl:  .  predniSONE (DELTASONE) 10 MG tablet, Take  4 each am x 2 days,   2 each am x 2 days,  1 each am x 2 days and stop, Disp: 14 tablet, Rfl: 11 .  sodium chloride (OCEAN) 0.65 % SOLN nasal spray, Place 1 spray into both nostrils as needed for congestion., Disp: , Rfl:  .  torsemide (DEMADEX) 20 MG tablet, Take 20-60 mg by mouth daily. 1-3 tabs (20mg  -60mg ) by mouth once daily, Disp: , Rfl:   No Known  Allergies  Objective: Vitals:   10/25/18 1316  Temp: 98.4 F (36.9 C)    Vascular Examination: Capillary refill time immediate x 10 digits  Dorsalis pedis and Posterior tibial pulses palpable b/l  Digital hair present x 10 digits  Skin temperature gradient WNL b/l  Dermatological Examination: Skin with normal turgor, texture and tone b/l  Toenails 1-5 b/l discolored, thick, dystrophic with subungual debris and pain with palpation to nailbeds due to thickness of nails.  Right great toe nailplate noted to be cracked into medial border with lateral aspect of nailplate remaining elongated. No erythema, no edema, no drainage, no flocculence.  Musculoskeletal: Muscle strength 5/5 to all LE muscle groups.  Pain noted at 1st MPJ right foot. No ecchymosis, mild edema, no warmth. She is actively able to move the digit and there is mild tenderness with passive ROM at the joint.  No pain, crepitus or joint limitation noted with ROM.   Neurological: Sensation intact with 10 gram monofilament.  Vibratory sensation intact.  Xrays negative for fracture right foot.  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 Toenails 1-5 b/l were debrided in length and girth without iatrogenic bleeding. 2. Right foot xrays taken/reviewed with patient. No evidence of fracture today. 3. Patient to continue soft, supportive shoe gear daily. 4. Patient to report any pedal injuries to medical professional immediately. 5. Follow up 3 months.  6. Patient/POA to call should there be a concern in the interim.

## 2018-10-25 NOTE — Patient Instructions (Signed)
Diabetes Mellitus and Foot Care Foot care is an important part of your health, especially when you have diabetes. Diabetes may cause you to have problems because of poor blood flow (circulation) to your feet and legs, which can cause your skin to:  Become thinner and drier.  Break more easily.  Heal more slowly.  Peel and crack. You may also have nerve damage (neuropathy) in your legs and feet, causing decreased feeling in them. This means that you may not notice minor injuries to your feet that could lead to more serious problems. Noticing and addressing any potential problems early is the best way to prevent future foot problems. How to care for your feet Foot hygiene  Wash your feet daily with warm water and mild soap. Do not use hot water. Then, pat your feet and the areas between your toes until they are completely dry. Do not soak your feet as this can dry your skin.  Trim your toenails straight across. Do not dig under them or around the cuticle. File the edges of your nails with an emery board or nail file.  Apply a moisturizing lotion or petroleum jelly to the skin on your feet and to dry, brittle toenails. Use lotion that does not contain alcohol and is unscented. Do not apply lotion between your toes. Shoes and socks  Wear clean socks or stockings every day. Make sure they are not too tight. Do not wear knee-high stockings since they may decrease blood flow to your legs.  Wear shoes that fit properly and have enough cushioning. Always look in your shoes before you put them on to be sure there are no objects inside.  To break in new shoes, wear them for just a few hours a day. This prevents injuries on your feet. Wounds, scrapes, corns, and calluses  Check your feet daily for blisters, cuts, bruises, sores, and redness. If you cannot see the bottom of your feet, use a mirror or ask someone for help.  Do not cut corns or calluses or try to remove them with medicine.  If you  find a minor scrape, cut, or break in the skin on your feet, keep it and the skin around it clean and dry. You may clean these areas with mild soap and water. Do not clean the area with peroxide, alcohol, or iodine.  If you have a wound, scrape, corn, or callus on your foot, look at it several times a day to make sure it is healing and not infected. Check for: ? Redness, swelling, or pain. ? Fluid or blood. ? Warmth. ? Pus or a bad smell. General instructions  Do not cross your legs. This may decrease blood flow to your feet.  Do not use heating pads or hot water bottles on your feet. They may burn your skin. If you have lost feeling in your feet or legs, you may not know this is happening until it is too late.  Protect your feet from hot and cold by wearing shoes, such as at the beach or on hot pavement.  Schedule a complete foot exam at least once a year (annually) or more often if you have foot problems. If you have foot problems, report any cuts, sores, or bruises to your health care provider immediately. Contact a health care provider if:  You have a medical condition that increases your risk of infection and you have any cuts, sores, or bruises on your feet.  You have an injury that is not   healing.  You have redness on your legs or feet.  You feel burning or tingling in your legs or feet.  You have pain or cramps in your legs and feet.  Your legs or feet are numb.  Your feet always feel cold.  You have pain around a toenail. Get help right away if:  You have a wound, scrape, corn, or callus on your foot and: ? You have pain, swelling, or redness that gets worse. ? You have fluid or blood coming from the wound, scrape, corn, or callus. ? Your wound, scrape, corn, or callus feels warm to the touch. ? You have pus or a bad smell coming from the wound, scrape, corn, or callus. ? You have a fever. ? You have a red line going up your leg. Summary  Check your feet every day  for cuts, sores, red spots, swelling, and blisters.  Moisturize feet and legs daily.  Wear shoes that fit properly and have enough cushioning.  If you have foot problems, report any cuts, sores, or bruises to your health care provider immediately.  Schedule a complete foot exam at least once a year (annually) or more often if you have foot problems. This information is not intended to replace advice given to you by your health care provider. Make sure you discuss any questions you have with your health care provider. Document Released: 04/14/2000 Document Revised: 05/30/2017 Document Reviewed: 05/19/2016 [employer] Patient Education  2020 [employer]  

## 2018-10-31 ENCOUNTER — Other Ambulatory Visit: Payer: Self-pay | Admitting: Nurse Practitioner

## 2018-11-04 ENCOUNTER — Ambulatory Visit (INDEPENDENT_AMBULATORY_CARE_PROVIDER_SITE_OTHER): Payer: Medicare Other | Admitting: Internal Medicine

## 2018-11-04 ENCOUNTER — Other Ambulatory Visit: Payer: Self-pay

## 2018-11-04 ENCOUNTER — Encounter: Payer: Self-pay | Admitting: Internal Medicine

## 2018-11-04 DIAGNOSIS — R05 Cough: Secondary | ICD-10-CM | POA: Diagnosis not present

## 2018-11-04 DIAGNOSIS — J4530 Mild persistent asthma, uncomplicated: Secondary | ICD-10-CM | POA: Diagnosis not present

## 2018-11-04 DIAGNOSIS — R058 Other specified cough: Secondary | ICD-10-CM

## 2018-11-04 NOTE — Progress Notes (Signed)
Subjective:   Patient ID: [name], female    DOB: July 20, 1950    MRN: [id number]    Brief patient profile:  27 yobf quit smoking 2005 with nl pfts 2012  with recurrent non-specific resp flares with nl pfts during flares c/w pseudoasthma.     History of Present Illness  01/24/2016  Extended post hosp f/u ov/ transition of care/ /[name] re: dtca asthma/ vcd symb 160 and pred 5 x 10 mg daily  Chief Complaint  Patient presents with  . Follow-up    Pt. recently got out the [facility] for an asthma attack, Pt. states her breathing remains the same, coughing with some yellow to white mucus,Has been needing to use Ventolin more often  last doing well 2 months prior to OV   While on Symb 160/ gerd rx but not dosing ac as rec  Waking up prematurely x 2 months with cough/wheeze/ sob but never contacted me as per the instructions rec Plan A = Automatic = dulera 100 Take 2 puffs first thing in am and then another 2 puffs about 12 hours later.                                      Nexum 40 mg Take 30-60 min before first meal of the day and Pepcid AC 20 mg  at bedtime Work on inhaler technique  Plan B = Backup Only use your albuterol as a rescue medication Plan C = Crisis - only use your albuterol nebulizer if you first try Plan B and it fails to help > ok to use the nebulizer up to every 4 hours but if start needing it regularly call for immediate appointment GERD diet  Take delsym two tsp every 12 hours and use the flutter valve as much as possible and supplement if needed with  tramadol 50 mg up to 2 every 4 hours to suppress the urge to cough  Once you have eliminated the cough for 3 straight days try reducing the tramadol first,  then the delsym as tolerated.   Prednisone 5 mg x 8, then 6, then 4 then 2, then 1 daily x 2 days and off  please schedule a follow up office visit in 2 weeks, sooner if needed with all meds/ inhalers/ neb solutions in hand     09/18/2016  f/u ov/[name] re:  UACS vs  Asthma brought med calendar but not updated / on symb 160 / max gerd rx  Chief Complaint  Patient presents with  . Follow-up    Pt c/o occ wheezing. She has not used rescue inhaler or neb recently.   still feels wheezing  And doe = MMRC1 = can walk nl pace, flat grade, can't hurry or go uphills or steps s sob rec Change the symbicort to 80 Take 2 puffs first thing in am and then another 2 puffs about 12 hours later.  Work on inhaler technique:  Use the med calendar daily to organize your medications as a "checklist" See [name] NP in 4  weeks with all your medications> never happened    Admit date: 12/19/2017 Discharge date: 12/24/2017  D/c home with steroids taper, monitor blood glucose while on steroids She reports run out of pulmicort nebs, new prescription provided, she is to follow up with Dr Melvyn Novas to further discuss asthma/copd meds, she is concerned about cost of these meds  Discharge Diagnoses:  Active Hospital Problems   Diagnosis Date Noted  . Acute bronchitis with asthma with acute exacerbation 01/16/2016  . Acute renal failure superimposed on stage 3 chronic kidney disease ([place]) 07/17/2016  . Chronic diastolic CHF (congestive heart failure) ([place]) 07/17/2016  . Acute respiratory failure with hypoxia ([place])   . Upper airway cough syndrome vs Asthma  08/28/2014  . Diabetes mellitus type 2, controlled ([place]) 08/06/2014  . Essential hypertension 07/21/2008           12/28/2017 ext post hosp f/u ov/[name] re: confused with meds again / only slightly better breathing  Chief Complaint  Patient presents with  . Follow-up    Breathing has improved slightly. She has been out of brovana due to high cost x 3 days. She has had to use her albuterol inhaler and neb daily since then.   Dyspnea:  50 ft = About the same as usual Cough: after supper is the worse, but not much mucus Sleeping: wakes up coughing 30 degrees due to sensation of reflux    SABA use: last 5 h prior to OV    02: none  Sleeping at 30 degrees    rec Plan A = Automatic = Performist 20 mcg one vial  with pulmocort one half vial twice daily  Gabapentin 100 mg three times daily Nexium Take 30- 60 min before your first and last meals of the day and zantac (ranitidine) at bedtime  Along with chlorpheniramine 4 mg 1 or 2 also at bedtime (over the counter)  Continue prednisone 10 mg every day  Plan B = Backup for breathing Only use your albuterol as a rescue medication  Plan B = Backup for coughing  Add tramadol 50 mg 1-2 every 4 hours if needed  Plan C = Crisis - only use your albuterol nebulizer if you first try Plan B and it fails to help > ok to use the nebulizer up to every 4 hours but if start needing it regularly call for immediate appointment     03/11/2018  f/u ov/[name] re: 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 Follow-up    Cough had improved and then started back 1 day ago.  Cough is now non prod.  She is using her albuterol inhaler 2 x daily on average. She has not needed albuterol neb.    Dyspnea:  MMRC2 = can't walk a nl pace on a flat grade s sob but does fine slow and flat slowed by knee  Cough: noct > day  Sleeping: 45 degrees due to subjective reflux despite max acid suppresion  SABA use: as above - never noct 02: none   rec Gabapentin 100 mg four  times daily  Take chortab 4 mg x 2 at bedtime and up to every 4 hours during the day as needed for drainage  Take delsym two tsp every 12 hours and supplement if needed with  tramadol 50 mg up to 2 every 4 hours to suppress the urge to cough. Swallowing water and/or using ice chips/non mint and menthol containing candies (such as lifesavers or sugarless jolly ranchers) are also effective.  You should rest your voice and avoid activities that you know make you cough. Once you have eliminated the cough for 3 straight days try reducing the tramadol first,  then the delsym as tolerated.  Plan D = Prednisone x 6 days  if not cough/ sob not better See calendar for specific medication instructions and bring it back for  each and every office visit for every healthcare provider you see.  Without it,  you may not receive the best quality medical care that we feel you deserve. Please schedule a follow up office visit in 4 weeks, call sooner if needed with all medications /inhalers/ solutions in hand so we can verify exactly what you are taking. This includes all medications from all doctors and over the [place] separate them into two bags:  the ones you take automatically, no matter what, vs the ones you take just when you feel you need them "BAG #2 is UP TO YOU"  - this will really help [place] help you take your medications more effectively.    03/15/18 back to ER - did not activate action plan prior (never took more than 1 tramadol every 12 hours to control cough.   03/20/2018  Extended post hops  f/u ov/[name] re: transition of care re   vcd vs asthma  Chief Complaint  Patient presents with  . Follow-up    recent admission 03/15/18- pt reports of wheezing, sob with exertion, no prod cough & chest tightness.   Dyspnea:  MMRC4  = sob if tries to leave home or while getting dressed   - no better now vs during hosp stay. Seen by Dr [place] Bells who strongly supported vcd dx during admit Cough: dry cough settles down overnight and with plb as does the "wheeze"  Sleeping: 45 degrees SABA use: last neb 8 h prior to OV  "did not help" 02: no  rec Increase gabapentin to 100 mg take 2 (=200)  four times daily as per med calendar For cough > delsym 2 tsp every 12 hours with use of flutter valve as much as possible  and supplement with tramadol 50 mg up to 1-2 every 4 hours as needed  For breathing problems not improving with use of nebulizer > Prednisone 10 mg take  4 each am x 2 days,   2 each am x 2 days,  1 each am x 2 days and stop  GERD diet / lifestyle recs Keep previous appt with your medications in 2 bags     04/05/2018  f/u ov/[name] re: uacs/ VCD - brought all meds but confused with gen vs trade names, could not tell me how she is supposed to take esmeprazole when I handed it to her.  Chief Complaint  Patient presents with  . Follow-up  Dyspnea:  50 ft  Cough: better  Sleeping: 45 degrees SABA use: twice a day hfa , rare neb 02: none   rec Gabapentin 300 mg four times daily  Budesonide 0.25 mcg twice daily with performist If breathing worse > Prednisone 10 mg take  4 each am x 2 days,   2 each am x 2 days,  1 each am x 2 days and stop  For cough > Take delsym two tsp every 12 hours and flutter valve as much as possible supplement if needed with  tramadol 50 mg (or tylenol #3) up to 2 every 4 hours to suppress the urge to cough. Swallowing water and/or using ice chips/non mint and menthol containing candies (such as lifesavers or sugarless jolly ranchers) are also effective.  You should rest your voice and avoid activities that you know make you cough. Once you have eliminated the cough for 3 straight days try reducing the tramadol first,  then the delsym as tolerated. Nexium (esmaprazole)  Take 30- 60 min before your first and last meals  of the day  Please schedule a follow up office visit in 2  weeks, sooner if needed - needs full pfts on return      04/18/2018  f/u ov/[name] re: asthma vs vcd  - brought most but not all her meds - missing nexium, has budesonide in prn bag/ pill organizer has 2 slots per day and hard to read to top print on each slot Chief Complaint  Patient presents with  . Follow-up    PFT's done today. She c/o increased SOB for the past 3 days. She is using her albuterol inhaler and neb with albuterol both about 2 x per day.   Dyspnea:  Improved, able to do [employer] shopping now  Cough: varies s pattern, min mucoid  Sleeping: 45 degrees all pillows  SABA use: twice daily due to overdoing it at work rec Plan A = Automatic = Budesonide/perforomist twice daily as per med  calendar Plan B = Backup Only use your albuterol inhaler (prefer Proair- RED  but same as Ventolin-blue) as a rescue medication to be used if you can't catch your breath by resting or doing a relaxed purse lip breathing pattern.  - The less you use it, the better it will work when you need it. - Ok to use the inhaler up to 2 puffs  every 4 hours if you must but call for appointment if use goes up over your usual need - Don't leave home without it !!  (think of it like the spare tire for your car)  Plan C = Crisis - only use your albuterol nebulizer if you first try Plan B and it fails to help > ok to use the nebulizer up to every 4 hours but if start needing it regularly call for immediate appointment     11/04/2018  f/u ov/[name] re: asthma with prob vcd component / did not bring meds or med calendar as req  Chief Complaint  Patient presents with  . Follow-up    Breathing is overall doing well and no new co's.   Dyspnea:  Not limited by breathing from desired activities   Cough: no Sleeping: on side now 2 pillows  SABA use: none 02: none    No obvious day to day or daytime variability or assoc excess/ purulent sputum or mucus plugs or hemoptysis or cp or chest tightness, subjective wheeze or overt sinus or hb symptoms.   Sleeping  without nocturnal  or early am exacerbation  of respiratory  c/o's or need for noct saba. Also denies any obvious fluctuation of symptoms with weather or environmental changes or other aggravating or alleviating factors except as outlined above   No unusual exposure hx or h/o childhood pna/ asthma or knowledge of premature birth.  Current Allergies, Complete Past Medical History, Past Surgical History, Family History, and Social History were reviewed in [employer] record.  ROS  The following are not active complaints unless bolded Hoarseness, sore throat, dysphagia, dental problems, itching, sneezing,  nasal congestion or discharge of  excess mucus or purulent secretions, ear ache,   fever, chills, sweats, unintended wt loss or wt gain, classically pleuritic or exertional cp,  orthopnea pnd or arm/hand swelling  or leg swelling, presyncope, palpitations, abdominal pain, anorexia, nausea, vomiting, diarrhea  or change in bowel habits or change in bladder habits, change in stools or change in urine, dysuria, hematuria,  rash, arthralgias, visual complaints, headache, numbness, weakness or ataxia or problems with walking or coordination,  change  in mood or  memory.        Current Meds  Medication Sig  . albuterol (PROAIR HFA) 108 (90 Base) MCG/ACT inhaler Inhale 2 puffs into the lungs every 4 (four) hours as needed for wheezing or shortness of breath.  Marland Kitchen albuterol (PROVENTIL) (2.5 MG/3ML) 0.083% nebulizer solution Take 2.5 mg by nebulization every 4 (four) hours as needed for wheezing or shortness of breath (((PLAN C))).  Marland Kitchen ALPRAZolam (XANAX) 0.5 MG tablet Take 1 tablet (0.5 mg total) by mouth 2 (two) times daily as needed for anxiety.  Marland Kitchen aspirin EC 81 MG tablet Take 81 mg by mouth daily.   Marland Kitchen atorvastatin (LIPITOR) 40 MG tablet Take 40 mg by mouth at bedtime.  . Biotin 5000 MCG CAPS Take 1 capsule by mouth daily.  . budesonide (PULMICORT) 0.25 MG/2ML nebulizer solution One vial twice daily  . BYSTOLIC 2.5 MG tablet TAKE 1 TABLET EVERY DAY  . chlorpheniramine (CHLOR-TRIMETON) 4 MG tablet Take 4 mg by mouth every 4 (four) hours as needed for allergies.  Marland Kitchen dextromethorphan (DELSYM) 30 MG/5ML liquid 2 tsp every 12 hours as needed w/ flutter  . diltiazem (CARTIA XT) 240 MG 24 hr capsule Take 1 capsule (240 mg total) by mouth daily. Please make annual appt for future refills. Thank you  . divalproex (DEPAKOTE ER) 500 MG 24 hr tablet Take 2 tablets (1,000 mg total) by mouth at bedtime.  Marland Kitchen esomeprazole (NEXIUM) 20 MG capsule Take 20 mg by mouth 2 (two) times daily before a meal.  . estradiol (ESTRACE) 1 MG tablet Take 1 mg by mouth daily.    . fluticasone (FLONASE) 50 MCG/ACT nasal spray Place 2 sprays into both nostrils daily as needed for allergies or rhinitis.  . formoterol (PERFOROMIST) 20 MCG/2ML nebulizer solution Take 2 mLs (20 mcg total) by nebulization 2 (two) times daily. J41.0  . gabapentin (NEURONTIN) 300 MG capsule Take 300 mg by mouth at bedtime.  . hydrALAZINE (APRESOLINE) 25 MG tablet Take 1 tablet (25 mg total) by mouth 2 (two) times daily. Please make annual appt for future refills. Thank you  . HYDROcodone-acetaminophen (NORCO/VICODIN) 5-325 MG tablet Take 1-2 tablets by mouth every 4 (four) hours as needed.  . metFORMIN (GLUCOPHAGE) 500 MG tablet Take 500 mg by mouth 2 (two) times daily with a meal.  . methocarbamol (ROBAXIN) 500 MG tablet Take 1 tablet (500 mg total) by mouth 2 (two) times daily.  . nitroGLYCERIN (NITROSTAT) 0.4 MG SL tablet Place 1 tablet (0.4 mg total) under the tongue every 5 (five) minutes as needed for chest pain. x3 as needed  . potassium chloride SA (K-DUR,KLOR-CON) 20 MEQ tablet Take 20 mEq by mouth daily.   . sodium chloride (OCEAN) 0.65 % SOLN nasal spray Place 1 spray into both nostrils as needed for congestion.  . torsemide (DEMADEX) 20 MG tablet Take 20-60 mg by mouth daily. 1-3 tabs (20mg  -60mg ) by mouth once daily              Objective:   Physical Exam   11/04/2018       157  02/23/2016   168 >  04/07/2016  168 > 05/08/2016    168 >  07/17/2016  162 > 09/18/2016    164  > 09/18/2017  160 > 12/28/2017 167  > 03/11/2018  164 > 03/20/2018   160 > 04/05/2018   171 > 04/18/2018  165    Vital signs reviewed - Note on arrival 02 sats  99% on  RA      HEENT: nl dentition, turbinates bilaterally, and oropharynx. Nl external ear canals without cough reflex   NECK :  without JVD/Nodes/TM/ nl carotid upstrokes bilaterally   LUNGS: no acc muscle use,  Nl contour chest which is clear to A and P bilaterally without cough on insp or exp maneuvers   CV:  RRR  no s3 or murmur or increase in  P2, and no edema   ABD:  soft and nontender with nl inspiratory excursion in the supine position. No bruits or organomegaly appreciated, bowel sounds nl  MS:  Nl gait/ ext warm without deformities, calf tenderness, cyanosis or clubbing No obvious joint restrictions   SKIN: warm and dry without lesions    NEURO:  alert, approp, nl sensorium with  no motor or cerebellar deficits apparent.

## 2018-11-04 NOTE — Patient Instructions (Signed)
No change in medications  Do not expose yourself to people who are not in your direct family if they are not wearing a mask    Please schedule a follow up visit in 3 months but call sooner if needed  with all medications /inhalers/ solutions in hand so we can verify exactly what you are taking. This includes all medications from all doctors and over the counters

## 2018-11-05 ENCOUNTER — Encounter: Payer: Self-pay | Admitting: Internal Medicine

## 2018-11-05 NOTE — Assessment & Plan Note (Addendum)
-   CT sinus 08/06/14 > neg  - Allergy profile 02/23/16  >  Eos 0.0 /  IgE  87 pos  RAST  Grass/ trees  - improved 05/08/2016 on gabapentin 300 tid > no change rx > d/c'd 07/2016 DgEs 10/09/17  Low range of penetration without aspiration. Otherwise, normal - Asthma exac 12/17/17- FENO 18. Significant upper airway wheeze, tx pred taper. Add neurontin 100mg  TID.  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 > rec max  gerd rx   - 04/05/2018 pt did not recognize generic bottle of nexium or how it was supposed to be taken  - 04/05/2018 rechallenge with gabapentin 300  Qid  - PFT's  04/18/2018  FEV1 1.13 (68 % ) ratio 91  p 7 % improvement from saba p nothing prior to study with DLCO  53/60 % corrects to 147  % for alv volume  s curvature        discussed impt of limiting covid19 risk  - if can't avoid at work then consider indefinite furlough until restrictions are either lifted or are being followed/enforced.   I had an extended discussion with the patient reviewing all relevant studies completed to date and  lasting 15 to 20 minutes of a 25 minute visit    Each maintenance medication was reviewed in detail including most importantly the difference between maintenance and prns and under what circumstances the prns are to be triggered using an action plan format that is not reflected in the computer generated alphabetically organized AVS.     Please see AVS for specific instructions unique to this visit that I personally wrote and verbalized to the the pt in detail and then reviewed with pt  by my nurse highlighting any  changes in therapy recommended at  today's visit to their plan of care.

## 2018-11-05 NOTE — Assessment & Plan Note (Addendum)
Onset of symptoms reported  2012 p quit smoking  2005  - FEN0 01/24/2016 = 13 with active symptoms on symb 160 2bid > changed to dulera 100  - Spirometry 02/23/2016  NO signifiant obstruction with active symptoms  - 02/23/2016  After extensive coaching HFA effectiveness =    75%  - FENO 02/23/2016  =  12   On dulera 100 2bid (unable to confirm adeherence as did not bring it - Singulair 10 mg daily added 02/23/2016 to dulera 100 2bid with 2 week sample only  (unable to verify she used it) - Allergy profile 02/23/16  >  Eos 0.0 /  IgE  87 pos  RAST  Grass/ trees  - 04/07/2016   symb 80 x one sample given - FENO 04/07/2016  =   43 ? Really on meds ?  - 05/08/2016  After extensive coaching HFA effectiveness =    75% > continue symb 80 2bid - 07/17/2016 added singulair maint   - FENO 09/18/2016  =   15 on symb 160 2bid > try 80 2bid  - flare late feb 2019 ? What maint rx she was taking but on admit "non-adherent per notes 08/12/17  - 09/18/2017  After extensive coaching inhaler device  effectiveness =    75% from a baseline of 50% > restart symb 80 2bid - FENO 09/18/2017  =   20 on symb 160  - Spirometry 09/18/2017  No obstruction at all with active wheeze     All goals of chronic asthma control met including optimal function and elimination of symptoms with minimal need for rescue therapy.  Contingencies discussed in full including contacting this office immediately if not controlling the symptoms using the rule of two's.     Needs to return with all meds in hand using a trust but verify approach to confirm accurate Medication  Reconciliation The principal here is that until we are certain that the  patients are doing what we've asked, it makes no sense to ask them to do more.  

## 2018-11-22 ENCOUNTER — Ambulatory Visit: Payer: Medicare Other | Admitting: Podiatry

## 2018-12-02 ENCOUNTER — Emergency Department (HOSPITAL_COMMUNITY)
Admission: EM | Admit: 2018-12-02 | Discharge: 2018-12-03 | Disposition: A | Discharge status: Home / Self Care | Payer: Medicare Other | Attending: Emergency Medicine | Admitting: Emergency Medicine

## 2018-12-02 ENCOUNTER — Emergency Department (HOSPITAL_COMMUNITY): Payer: Medicare Other

## 2018-12-02 ENCOUNTER — Encounter (HOSPITAL_COMMUNITY): Payer: Self-pay

## 2018-12-02 DIAGNOSIS — I130 Hypertensive heart and chronic kidney disease with heart failure and stage 1 through stage 4 chronic kidney disease, or unspecified chronic kidney disease: Secondary | ICD-10-CM | POA: Diagnosis not present

## 2018-12-02 DIAGNOSIS — K2100 Gastro-esophageal reflux disease with esophagitis, without bleeding: Secondary | ICD-10-CM

## 2018-12-02 DIAGNOSIS — N183 Chronic kidney disease, stage 3 (moderate): Secondary | ICD-10-CM | POA: Diagnosis not present

## 2018-12-02 DIAGNOSIS — Z79899 Other long term (current) drug therapy: Secondary | ICD-10-CM | POA: Insufficient documentation

## 2018-12-02 DIAGNOSIS — I252 Old myocardial infarction: Secondary | ICD-10-CM | POA: Insufficient documentation

## 2018-12-02 DIAGNOSIS — R1013 Epigastric pain: Secondary | ICD-10-CM | POA: Insufficient documentation

## 2018-12-02 DIAGNOSIS — E1140 Type 2 diabetes mellitus with diabetic neuropathy, unspecified: Secondary | ICD-10-CM | POA: Diagnosis not present

## 2018-12-02 DIAGNOSIS — D649 Anemia, unspecified: Secondary | ICD-10-CM | POA: Insufficient documentation

## 2018-12-02 DIAGNOSIS — E1122 Type 2 diabetes mellitus with diabetic chronic kidney disease: Secondary | ICD-10-CM | POA: Insufficient documentation

## 2018-12-02 DIAGNOSIS — Z87891 Personal history of nicotine dependence: Secondary | ICD-10-CM | POA: Diagnosis not present

## 2018-12-02 DIAGNOSIS — Z7984 Long term (current) use of oral hypoglycemic drugs: Secondary | ICD-10-CM | POA: Insufficient documentation

## 2018-12-02 DIAGNOSIS — R0789 Other chest pain: Secondary | ICD-10-CM | POA: Insufficient documentation

## 2018-12-02 DIAGNOSIS — Z7982 Long term (current) use of aspirin: Secondary | ICD-10-CM | POA: Insufficient documentation

## 2018-12-02 DIAGNOSIS — I5032 Chronic diastolic (congestive) heart failure: Secondary | ICD-10-CM | POA: Diagnosis not present

## 2018-12-02 DIAGNOSIS — I2510 Atherosclerotic heart disease of native coronary artery without angina pectoris: Secondary | ICD-10-CM | POA: Insufficient documentation

## 2018-12-02 DIAGNOSIS — Z955 Presence of coronary angioplasty implant and graft: Secondary | ICD-10-CM | POA: Diagnosis not present

## 2018-12-02 LAB — CBC
HCT: 35.1 % — ABNORMAL LOW (ref 36.0–46.0)
Hemoglobin: 11.2 g/dL — ABNORMAL LOW (ref 12.0–15.0)
MCH: 30.9 pg (ref 26.0–34.0)
MCHC: 31.9 g/dL (ref 30.0–36.0)
MCV: 96.7 fL (ref 80.0–100.0)
Platelets: 300 10*3/uL (ref 150–400)
RBC: 3.63 MIL/uL — ABNORMAL LOW (ref 3.87–5.11)
RDW: 13.2 % (ref 11.5–15.5)
WBC: 8.9 10*3/uL (ref 4.0–10.5)
nRBC: 0 % (ref 0.0–0.2)

## 2018-12-02 LAB — BASIC METABOLIC PANEL
Anion gap: 11 (ref 5–15)
BUN: 25 mg/dL — ABNORMAL HIGH (ref 8–23)
CO2: 26 mmol/L (ref 22–32)
Calcium: 9.2 mg/dL (ref 8.9–10.3)
Chloride: 103 mmol/L (ref 98–111)
Creatinine, Ser: 1.14 mg/dL — ABNORMAL HIGH (ref 0.44–1.00)
GFR calc Af Amer: 57 mL/min — ABNORMAL LOW (ref >60)
GFR calc non Af Amer: 49 mL/min — ABNORMAL LOW (ref >60)
Glucose, Bld: 113 mg/dL — ABNORMAL HIGH (ref 70–99)
Potassium: 4.3 mmol/L (ref 3.5–5.1)
Sodium: 140 mmol/L (ref 135–145)

## 2018-12-02 LAB — HEPATIC FUNCTION PANEL
ALT: 11 U/L (ref 0–44)
AST: 15 U/L (ref 15–41)
Albumin: 3.6 g/dL (ref 3.5–5.0)
Alkaline Phosphatase: 53 U/L (ref 38–126)
Bilirubin, Direct: 0.1 mg/dL (ref 0.0–0.2)
Indirect Bilirubin: 0.2 mg/dL — ABNORMAL LOW (ref 0.3–0.9)
Total Bilirubin: 0.3 mg/dL (ref 0.3–1.2)
Total Protein: 6.9 g/dL (ref 6.5–8.1)

## 2018-12-02 LAB — CBG MONITORING, ED: Glucose-Capillary: 110 mg/dL — ABNORMAL HIGH (ref 70–99)

## 2018-12-02 LAB — LIPASE, BLOOD: Lipase: 22 U/L (ref 11–51)

## 2018-12-02 LAB — TROPONIN I (HIGH SENSITIVITY): Troponin I (High Sensitivity): <2 ng/L (ref <18)

## 2018-12-02 MED ORDER — SODIUM CHLORIDE 0.9% FLUSH
3.0000 mL | Freq: Once | INTRAVENOUS | Status: AC
  Administered 2018-12-02: 3 mL via INTRAVENOUS

## 2018-12-02 MED ORDER — ALUM & MAG HYDROXIDE-SIMETH 200-200-20 MG/5ML PO SUSP
30.0000 mL | Freq: Once | ORAL | Status: AC
  Administered 2018-12-02: 30 mL via ORAL
  Filled 2018-12-02: qty 30

## 2018-12-02 MED ORDER — SUCRALFATE 1 G PO TABS
1.0000 g | ORAL_TABLET | Freq: Once | ORAL | Status: AC
  Administered 2018-12-02: 1 g via ORAL
  Filled 2018-12-02: qty 1

## 2018-12-02 MED ORDER — SUCRALFATE 1 G PO TABS: 1.0000 g | ORAL_TABLET | Freq: Four times a day (QID) | ORAL | 0 refills | Status: DC

## 2018-12-02 MED ORDER — DICYCLOMINE HCL 10 MG PO CAPS
10.0000 mg | ORAL_CAPSULE | Freq: Once | ORAL | Status: AC
  Administered 2018-12-02: 23:00:00 10 mg via ORAL
  Filled 2018-12-02: qty 1

## 2018-12-02 MED ORDER — LIDOCAINE VISCOUS HCL 2 % MT SOLN
15.0000 mL | Freq: Once | OROMUCOSAL | Status: AC
  Administered 2018-12-02: 15 mL via ORAL
  Filled 2018-12-02: qty 15

## 2018-12-02 NOTE — ED Notes (Signed)
Pt. Attempted to ambulate to restroom and complained of serious dizziness.

## 2018-12-02 NOTE — ED Provider Notes (Signed)
[facility] EMERGENCY DEPARTMENT Provider Note   CSN: [id number] Arrival date & time: 12/02/18  2032     History   Chief Complaint Chief Complaint  Patient presents with  . Chest Pain    HPI [name] is a 68 y.o. female.     68 year old female presents with sudden onset of epigastric pain that radiates to her left lower chest.  No associated dyspnea or diaphoresis.  States that she has had reflux in the past and this feels similar.  Did have a heavy positive just for lunch and took a nap and woke up with the symptoms.  No exertional component to them.  Took a nitroglycerin which did help.  Does have a history of MI but states that the symptoms are not similar.  Called 911 also took 4 baby aspirin.  States her symptoms have been persistent for several hours.  She is unsure if she has had belching.  No recent fever, cough, congestion.     Past Medical History:  Diagnosis Date  . Anxiety   . Asthma   . Bronchitis   . Chronic diastolic CHF (congestive heart failure) ([place])   . Chronic diastolic CHF (congestive heart failure) ([place]) 07/17/2016  . Coronary artery disease    a. NSTEMI 12/2015 - LHC 01/18/16: s/p overlapping DESx2 to RCA, 10% ost-prox Cx,10% mLAD.  Marland Kitchen Depression   . Diabetes mellitus   . Dyslipidemia   . GERD (gastroesophageal reflux disease)   . Heart attack ([place])   . Hypertension   . Hypertensive heart disease   . NSTEMI (non-ST elevated myocardial infarction) ([place]) 12/2015  . Pneumonia     Patient Active Problem List   Diagnosis Date Noted  . Severe persistent asthma without complication 33/29/5188  . DM2 (diabetes mellitus, type 2) ([place]) 03/15/2018  . CKD (chronic kidney disease), stage III ([place]) 03/15/2018  . Dyspnea 08/12/2017  . Macrocytic anemia 08/12/2017  . Therapeutic drug monitoring 05/17/2017  . Lung nodule 08/01/2016  . Influenza B 07/18/2016  . Chronic diastolic CHF (congestive heart failure) ([place]) 07/17/2016  . Acute  renal failure superimposed on stage 3 chronic kidney disease ([place]) 07/17/2016  . Right leg swelling 02/27/2016  . Acute respiratory failure with hypoxia ([place])   . Uncontrolled type 2 diabetes mellitus with complication ([place])   . Acute pulmonary embolism ([place])   . HLD (hyperlipidemia)   . Seizures ([place]) 01/27/2016  . Numbness 01/27/2016  . CAD in native artery 01/20/2016  . Hypertensive heart disease 01/20/2016  . NSTEMI (non-ST elevated myocardial infarction) ([place]) 01/18/2016  . Acute bronchitis with asthma with acute exacerbation 01/16/2016  . Hypokalemia 01/16/2016  . Hypomagnesemia 01/16/2016  . Acute respiratory failure ([place]) 01/16/2016  . Dyspnea on exertion   . Asthma exacerbation 07/21/2015  . Upper airway cough syndrome with VCD 08/28/2014  . Diabetes mellitus type 2, controlled ([place]) 08/06/2014  . Hyperlipidemia 08/06/2014  . Allergic rhinitis 07/22/2008  . 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 DYSPNEA 07/22/2008  . Diabetes mellitus type 2, uncontrolled ([place]) 07/21/2008  . DIABETIC PERIPHERAL NEUROPATHY 07/21/2008  . Depression with anxiety 07/21/2008  . Essential hypertension 07/21/2008  . Mild persistent chronic asthma without complication 41/66/0630  . G E R D 07/21/2008  . PULMONARY EMBOLISM, HX OF 07/21/2008    Past Surgical History:  Procedure Laterality Date  . ABDOMINAL HYSTERECTOMY    . CARDIAC CATHETERIZATION  1994   minimal LAD dz, no other dz, EF normal  .  CARDIAC CATHETERIZATION N/A 01/18/2016   Procedure: Left Heart Cath and Coronary Angiography;  Surgeon: Burnell Blanks, MD;  Location: [facility];  Service: Cardiovascular;  Laterality: N/A;  . CARDIAC CATHETERIZATION N/A 01/18/2016   Procedure: Coronary Stent Intervention;  Surgeon: Burnell Blanks, MD;  Location: [facility];  Service: Cardiovascular;  Laterality: N/A;  . COLONOSCOPY WITH PROPOFOL N/A 04/09/2014   Procedure: COLONOSCOPY WITH PROPOFOL;  Surgeon:  Cleotis Nipper, MD;  Location: [facility];  Service: Endoscopy;  Laterality: N/A;  . CORONARY STENT PLACEMENT  01/19/2016   STENT SYNERGY DES 5.49I26 drug eluting stent was successfully placed, and overlaps the 2.5 x 38 mm Synergy stent placed distally.  . ESOPHAGOGASTRODUODENOSCOPY (EGD) WITH PROPOFOL N/A 04/09/2014   Procedure: ESOPHAGOGASTRODUODENOSCOPY (EGD) WITH PROPOFOL;  Surgeon: Cleotis Nipper, MD;  Location: [facility];  Service: Endoscopy;  Laterality: N/A;  . HEMORRHOID SURGERY    . HERNIA REPAIR    . KNEE ARTHROSCOPY       OB History   No obstetric history on file.      Home Medications    Prior to Admission medications   Medication Sig Start Date End Date Taking? Authorizing Provider  albuterol (PROAIR HFA) 108 (90 Base) MCG/ACT inhaler Inhale 2 puffs into the lungs every 4 (four) hours as needed for wheezing or shortness of breath. 06/29/17   Hayden Rasmussen, MD  albuterol (PROVENTIL) (2.5 MG/3ML) 0.083% nebulizer solution Take 2.5 mg by nebulization every 4 (four) hours as needed for wheezing or shortness of breath (((PLAN C))).    [provider]  ALPRAZolam Duanne Moron) 0.5 MG tablet Take 1 tablet (0.5 mg total) by mouth 2 (two) times daily as needed for anxiety. 07/31/15   Theodis Blaze, MD  aspirin EC 81 MG tablet Take 81 mg by mouth daily.     [provider]  atorvastatin (LIPITOR) 40 MG tablet Take 40 mg by mouth at bedtime.    [provider]  Biotin 5000 MCG CAPS Take 1 capsule by mouth daily.    [provider]  budesonide (PULMICORT) 0.25 MG/2ML nebulizer solution One vial twice daily 04/05/18   Tanda Rockers, MD  BYSTOLIC 2.5 MG tablet TAKE 1 TABLET EVERY DAY 09/18/18   Burtis Junes, NP  chlorpheniramine (CHLOR-TRIMETON) 4 MG tablet Take 4 mg by mouth every 4 (four) hours as needed for allergies.    [provider]  dextromethorphan (DELSYM) 30 MG/5ML liquid 2 tsp every 12 hours as needed w/ flutter     [provider]  diltiazem (CARTIA XT) 240 MG 24 hr capsule Take 1 capsule (240 mg total) by mouth daily. Please make annual appt for future refills. Thank you 09/25/18   Burtis Junes, NP  divalproex (DEPAKOTE ER) 500 MG 24 hr tablet Take 2 tablets (1,000 mg total) by mouth at bedtime. 05/20/18   Dennie Bible, NP  esomeprazole (NEXIUM) 20 MG capsule Take 20 mg by mouth 2 (two) times daily before a meal.    [provider]  estradiol (ESTRACE) 1 MG tablet Take 1 mg by mouth daily.     [provider]  fluticasone (FLONASE) 50 MCG/ACT nasal spray Place 2 sprays into both nostrils daily as needed for allergies or rhinitis.    [provider]  formoterol (PERFOROMIST) 20 MCG/2ML nebulizer solution Take 2 mLs (20 mcg total) by nebulization 2 (two) times daily. J41.0 12/28/17   Tanda Rockers, MD  gabapentin (NEURONTIN)  300 MG capsule Take 300 mg by mouth at bedtime.    [provider]  hydrALAZINE (APRESOLINE) 25 MG tablet Take 1 tablet (25 mg total) by mouth 2 (two) times daily. Please make annual appt for future refills. Thank you 10/31/18   Burtis Junes, NP  HYDROcodone-acetaminophen (NORCO/VICODIN) 5-325 MG tablet Take 1-2 tablets by mouth every 4 (four) hours as needed. 10/14/18   Lacretia Leigh, MD  metFORMIN (GLUCOPHAGE) 500 MG tablet Take 500 mg by mouth 2 (two) times daily with a meal.    [provider]  methocarbamol (ROBAXIN) 500 MG tablet Take 1 tablet (500 mg total) by mouth 2 (two) times daily. 10/14/18   Lacretia Leigh, MD  nitroGLYCERIN (NITROSTAT) 0.4 MG SL tablet Place 1 tablet (0.4 mg total) under the tongue every 5 (five) minutes as needed for chest pain. x3 as needed 09/17/17   Burtis Junes, NP  potassium chloride SA (K-DUR,KLOR-CON) 20 MEQ tablet Take 20 mEq by mouth daily.     [provider]  predniSONE (DELTASONE) 10 MG tablet Take  4 each am x 2 days,   2 each am x 2 days,  1 each am x 2 days and stop  Patient not taking: Reported on 11/04/2018 08/01/18   Tanda Rockers, MD  sodium chloride (OCEAN) 0.65 % SOLN nasal spray Place 1 spray into both nostrils as needed for congestion.    [provider]  torsemide (DEMADEX) 20 MG tablet Take 20-60 mg by mouth daily. 1-3 tabs (20mg  -60mg ) by mouth once daily 10/25/16   [provider]    Family History Family History  Problem Relation Age of Onset  . Allergies Mother   . Asthma Mother   . CAD Father 62  . Diabetes Father   . Hypertension Father   . Congestive Heart Failure Sister        died in her 65s  . CAD Sister 46    Social History Social History   Tobacco Use  . Smoking status: Former Smoker    Packs/day: 1.00    Years: 30.00    Pack years: 30.00    Types: Cigarettes    Quit date: 01/09/2004    Years since quitting: 14.9  . Smokeless tobacco: Never Used  Substance Use Topics  . Alcohol use: No    Alcohol/week: 0.0 standard drinks  . Drug use: No     Allergies   Patient has no known allergies.   Review of Systems Review of Systems  All other systems reviewed and are negative.    Physical Exam Updated Vital Signs BP (!) 143/69   Pulse 61   Temp 98.6 F (37 C)   Resp 16   Ht 1.575 m (5\' 2" )   Wt 75.3 kg   LMP  (LMP Unknown)   SpO2 99%   BMI 30.36 kg/m   Physical Exam Vitals signs and nursing note reviewed.  Constitutional:      General: She is not in acute distress.    Appearance: Normal appearance. She is well-developed. She is not toxic-appearing.  HENT:     Head: Normocephalic and atraumatic.  Eyes:     General: Lids are normal.     Conjunctiva/sclera: Conjunctivae normal.     Pupils: Pupils are equal, round, and reactive to light.  Neck:     Musculoskeletal: Normal range of motion and neck supple.     Thyroid: No thyroid mass.     Trachea: No tracheal deviation.  Cardiovascular:     Rate and Rhythm: Normal rate and regular rhythm.     Heart sounds: Normal heart sounds. No  murmur. No gallop.   Pulmonary:     Effort: Pulmonary effort is normal. No respiratory distress.     Breath sounds: Normal breath sounds. No stridor. No decreased breath sounds, wheezing, rhonchi or rales.  Abdominal:     General: Bowel sounds are normal. There is no distension.     Palpations: Abdomen is soft.     Tenderness: There is abdominal tenderness in the epigastric area. There is no rebound.    Musculoskeletal: Normal range of motion.        General: No tenderness.  Skin:    General: Skin is warm and dry.     Findings: No abrasion or rash.  Neurological:     Mental Status: She is alert and oriented to person, place, and time.     GCS: GCS eye subscore is 4. GCS verbal subscore is 5. GCS motor subscore is 6.     Cranial Nerves: No cranial nerve deficit.     Sensory: No sensory deficit.  Psychiatric:        Speech: Speech normal.        Behavior: Behavior normal.      ED Treatments / Results  Labs (all labs ordered are listed, but only abnormal results are displayed) Labs Reviewed  BASIC METABOLIC PANEL - Abnormal; Notable for the following components:      Result Value   Glucose, Bld 113 (*)    BUN 25 (*)    Creatinine, Ser 1.14 (*)    GFR calc non Af Amer 49 (*)    GFR calc Af Amer 57 (*)    All other components within normal limits  CBC - Abnormal; Notable for the following components:   RBC 3.63 (*)    Hemoglobin 11.2 (*)    HCT 35.1 (*)    All other components within normal limits  CBG MONITORING, ED - Abnormal; Notable for the following components:   Glucose-Capillary 110 (*)    All other components within normal limits  LIPASE, BLOOD  HEPATIC FUNCTION PANEL  TROPONIN I (HIGH SENSITIVITY)    EKG None  Radiology Dg Chest 2 View  Result Date: 12/02/2018 CLINICAL DATA:  Substernal chest pain EXAM: CHEST - 2 VIEW COMPARISON:  03/15/2018 FINDINGS: Heart is borderline in size. Lungs are clear. No effusions or edema. No acute bony abnormality.  IMPRESSION: No active cardiopulmonary disease. Electronically Signed   By: Rolm Baptise M.D.   On: 12/02/2018 21:40    Procedures Procedures (including critical care time)  Medications Ordered in ED Medications  sucralfate (CARAFATE) tablet 1 g (has no administration in time range)  alum & mag hydroxide-simeth (MAALOX/MYLANTA) 200-200-20 MG/5ML suspension 30 mL (has no administration in time range)    And  lidocaine (XYLOCAINE) 2 % viscous mouth solution 15 mL (has no administration in time range)  dicyclomine (BENTYL) 10 MG/5ML syrup 10 mg (has no administration in time range)  sodium chloride flush (NS) 0.9 % injection 3 mL (3 mLs Intravenous Given 12/02/18 2140)     Initial Impression / Assessment and Plan / ED Course  I have reviewed the triage vital signs and the nursing notes.  Pertinent labs & imaging results that were available during my care of the patient were reviewed by me and considered in my medical decision making (see chart for details).  Patient given GI cocktail along with antiacids and feels better.  Suspect that this is GI related and patient will have delta troponin and like to be discharged home.  Care turned over to Dr. Roxanne Mins  Final Clinical Impressions(s) / ED Diagnoses   Final diagnoses:  None    ED Discharge Orders    None       Lacretia Leigh, MD 12/02/18 2309

## 2018-12-02 NOTE — ED Notes (Signed)
Patient transported to X-ray 

## 2018-12-02 NOTE — ED Triage Notes (Signed)
Pt. Arrived via EMS from home with the c/o of substrenal Chest pain that radiates to the right side and neck area while she was asleeep. Pt. Took 324mg  asa and 0.4mg  Nitro prior to EMS arrival. Another 0.4mg  nitro given in route and patient did not have any relief. Hx. Of MI, CHF, and acid reflux. 20G IV, CBG129 BP: 139/78.

## 2018-12-03 ENCOUNTER — Telehealth: Payer: Self-pay | Admitting: Internal Medicine

## 2018-12-03 ENCOUNTER — Other Ambulatory Visit: Payer: Self-pay | Admitting: Nurse Practitioner

## 2018-12-03 DIAGNOSIS — J410 Simple chronic bronchitis: Secondary | ICD-10-CM

## 2018-12-03 LAB — TROPONIN I (HIGH SENSITIVITY): Troponin I (High Sensitivity): 4 ng/L (ref <18)

## 2018-12-03 MED ORDER — PERFOROMIST 20 MCG/2ML IN NEBU: 20.0000 ug | INHALATION_SOLUTION | RESPIRATORY_TRACT | 3 refills | Status: DC

## 2018-12-03 NOTE — Telephone Encounter (Signed)
Spoke with pt and advised rx sent to pharmacy. Nothing further is needed.   

## 2018-12-03 NOTE — ED Provider Notes (Signed)
Care assumed from Dr. Zenia Resides, patient with epigastric and chest pain felt likely to be due to gastroesophageal reflux, currently pain-free.  She was signed out to me pending hepatic function panel, lipase, delta troponin.  All of those have come back normal.  She continues to be symptom-free.  She is felt to be safe for discharge.   Delora Fuel, MD 35/00/93 [id number]

## 2018-12-03 NOTE — ED Notes (Signed)
Patient verbalized understanding of discharge instructions. Opportunity for questions were provided. Pt. ambulatory and discharged home.  

## 2018-12-17 ENCOUNTER — Other Ambulatory Visit: Payer: Self-pay

## 2018-12-17 ENCOUNTER — Encounter: Payer: Self-pay | Admitting: Podiatry

## 2018-12-17 ENCOUNTER — Ambulatory Visit (INDEPENDENT_AMBULATORY_CARE_PROVIDER_SITE_OTHER): Payer: Medicare Other | Admitting: Podiatry

## 2018-12-17 VITALS — Temp 98.0°F

## 2018-12-17 DIAGNOSIS — B351 Tinea unguium: Secondary | ICD-10-CM

## 2018-12-17 DIAGNOSIS — M79674 Pain in right toe(s): Secondary | ICD-10-CM | POA: Diagnosis not present

## 2018-12-17 DIAGNOSIS — IMO0002 Reserved for concepts with insufficient information to code with codable children: Secondary | ICD-10-CM

## 2018-12-17 DIAGNOSIS — B353 Tinea pedis: Secondary | ICD-10-CM

## 2018-12-17 DIAGNOSIS — M79675 Pain in left toe(s): Secondary | ICD-10-CM | POA: Diagnosis not present

## 2018-12-17 DIAGNOSIS — E1165 Type 2 diabetes mellitus with hyperglycemia: Secondary | ICD-10-CM

## 2018-12-17 MED ORDER — CLOTRIMAZOLE-BETAMETHASONE 1-0.05 % EX CREA: 1.0000 "application " | TOPICAL_CREAM | Freq: Two times a day (BID) | CUTANEOUS | 0 refills | Status: DC

## 2018-12-17 NOTE — Patient Instructions (Signed)

## 2018-12-26 NOTE — Progress Notes (Signed)
Subjective:  [name] presents to clinic today for preventative foot care with cc of  painful, thick, discolored, elongated toenails 1-5 b/l that become tender and cannot cut because of thickness.  Pain is aggravated when wearing enclosed shoe gear.  She was also seen on last visit for contusion of her right first MPJ.  She states her right great toe feels better.  She recently celebrated her 40th wedding anniversary.  [name], MD is her PCP.  No Known Allergies   Objective: Vitals:   12/17/18 1025  Temp: 98 F (36.7 C)    Physical Examination:  Vascular Examination: Capillary refill time immediate x 10 digits.  Palpable DP/PT pulses b/l.  Digital hair present b/l.  No edema noted b/l.  Skin temperature gradient WNL b/l.  Dermatological Examination: Skin with normal turgor, texture and tone b/l.  No open wounds b/l.  No interdigital macerations noted b/l.  Elongated, thick, discolored brittle toenails with subungual debris and pain on dorsal palpation of nailbeds 1-5 b/l.  Diffuse scaling noted peripherally and plantarly b/l feet with mild foot odor.  No interdigital macerations.  No blisters, no weeping. No signs of secondary bacterial infection noted.  Musculoskeletal Examination: Muscle strength 5/5 to all muscle groups b/l.  No pain elicited with passive range of motion of the right first MPJ.  No pain, crepitus or joint discomfort with active/passive ROM.  Neurological Examination: Sensation intact 5/5 b/l with 10 gram monofilament.  Vibratory sensation intact b/l.  Proprioceptive sensation intact b/l.  Assessment: Mycotic nail infection with pain 1-5 b/l Tinea pedis bilaterally NIDDM  Plan: 1. Toenails 1-5 b/l were debrided in length and girth without iatrogenic laceration.  2. For tinea pedis, Rx was sent for Lotrisone Cream to be applied to both feet and between toes bid for 4 weeks. 2.  Continue soft, supportive shoe gear  daily. 3.  Report any pedal injuries to medical professional. 4.  Follow up 3 months. 5.  Patient/POA to call should there be a question/concern in there interim.

## 2018-12-27 ENCOUNTER — Other Ambulatory Visit: Payer: Self-pay | Admitting: Nurse Practitioner

## 2019-01-14 ENCOUNTER — Telehealth: Payer: Self-pay | Admitting: Cardiology

## 2019-01-14 NOTE — Telephone Encounter (Signed)
Spoke with patient who does report a 8 lb wt increase since 9/3.  She states she thought she was doing what she is supposed to be doing and doesn't understand why her wt has increased.  She has retired recently.  She reports to doesn't really cook any more and orders a lot of her food out.  She also reports she she been drinking a lot more flyuids since she has been retired and at home.  Advised fast foods, prepared meals and restaurant foods all have a lot of NA+ in them and she should avoid them as much as possible.  Strongly advised to limit both NA+ and fluid in take to no more than 2,000 mg/ 2L per day.  Advised to keep feet and legs elevated as much as possible during the day and to wear knee high compression hose daily.  She reports keeping legs elevated does not help her and wearing compression stockings causes her knees to swell.  She reports she has only been taking (1) 20 mg tablet of Torsemide daily.  Advise to take a 2nd tablet now and tomorrow to take (2) tablets in the AM.  She will continue to take this dose until she is able to remove the 8 lbs of fluid.  Will forward to Dr Marlou Porch for his knowledge.

## 2019-01-14 NOTE — Telephone Encounter (Signed)
Agree with current plan.  Excellent. Candee Furbish, MD

## 2019-01-14 NOTE — Telephone Encounter (Signed)
  [name] is calling because patient is up about 8 pounds since 01/02/19 and having increased SOB and edema in lower extremities. Patient states she has been following her fluid plan and taking her diuretics. [name] feels the SOB is coming from her CHF. Weight today was 164.2 which was slowly trending up but has jumped up since Sunday. She states patient has been taking an extra fluid pill a day but it is not helping. [name] states her BP was 164/73 this morning which is high for the patient. Please contact patient to discuss.

## 2019-01-15 ENCOUNTER — Encounter: Payer: Self-pay | Admitting: Adult Health

## 2019-01-15 ENCOUNTER — Other Ambulatory Visit: Payer: Self-pay

## 2019-01-15 ENCOUNTER — Ambulatory Visit (INDEPENDENT_AMBULATORY_CARE_PROVIDER_SITE_OTHER): Payer: Medicare Other | Admitting: Adult Health

## 2019-01-15 DIAGNOSIS — J4530 Mild persistent asthma, uncomplicated: Secondary | ICD-10-CM

## 2019-01-15 DIAGNOSIS — K219 Gastro-esophageal reflux disease without esophagitis: Secondary | ICD-10-CM | POA: Diagnosis not present

## 2019-01-15 DIAGNOSIS — J301 Allergic rhinitis due to pollen: Secondary | ICD-10-CM | POA: Diagnosis not present

## 2019-01-15 NOTE — Assessment & Plan Note (Signed)
Cont on GERD diet  Restart Pepcid At bedtime   Cont on NExium daily

## 2019-01-15 NOTE — Patient Instructions (Addendum)
Continue on Budesonide and Perforomist Neb daily  Restart Pepcid/Famotidine 10mg  At bedtime  .  Follow med calendar closely and bring to each visit  Follow up Dr. Melvyn Novas  Or Parrett NP In 4 month and As needed   Please contact office for sooner follow up if symptoms do not improve or worsen or seek emergency care

## 2019-01-15 NOTE — Progress Notes (Signed)
@Patient  ID: [name], female    DOB: 09-28-1950, 68 y.o.   MRN: [id number]  Chief Complaint  Patient presents with  . Follow-up    Asthma     Referring provider: [name], MD  HPI: 68 year old female former smoker followed for asthma and chronic cough Medical history significant for coronary artery disease, diabetes, diastolic heart failure, seizure disorder  TEST/EVENTS :  FEN0 01/24/2016= 13 with active symptoms on symb 160 2bid >changed to dulera 100  - Spirometry 02/23/2016 NO signifiant obstruction with active symptoms  - FENO 02/23/2016= 12 On dulera 100 2bid (unable to confirm adeherence as did not bring it - Singulair 10 mg daily added 10/25/2017to dulera 100 2bid with 2 week sample only (unable to verify she used it) - FENO 04/07/2016= 43 -CTa CHest 2017 >neg PE . 4 mm RUL nodule  CT sinus 2016 neg IgE 87 2017,positive Rast grass, dust, tree High-resolution CT chest April 2018--NEGfor ILD, stable 4 mm right upper lobe nodule. Echo 07/2017 EF 65%,Gr 2 DD , PAP 27mmHg  Spirometry 08/2017 Moderate restriction , no obstruction  01/15/2019 Follow up : Asthma , Chronic Cough  Patient returns for a 86-month follow-up.  Patient has mild to moderate persistent asthma with upper airway cough.  She says she has been doing well over the last several months.  She has no increased cough or shortness of breath.  She has decreased her budesonide and Perforomist nebulizer to daily dosing.  He says she is had no flare since going down on this dose.  She says that she seems to do well on this and is also very expensive so she wants to stay on this dosing.  She has brought all her medications in for review.  She appears to be taking them correctly.  She is out of Pepcid and needs to purchase this.  We updated her medication count with patient education. Has recently gotten her flu vaccine.   No Known Allergies  Immunization History  Administered Date(s)  Administered  . Influenza Split 03/10/2012, 03/14/2015  . Influenza, High Dose Seasonal PF 02/04/2017, 01/04/2018  . Influenza-Unspecified 01/11/2016  . Zoster Recombinat (Shingrix) 06/11/2017, 08/28/2017, 09/27/2017    Past Medical History:  Diagnosis Date  . Anxiety   . Asthma   . Bronchitis   . Chronic diastolic CHF (congestive heart failure) ([place])   . Chronic diastolic CHF (congestive heart failure) ([place]) 07/17/2016  . Coronary artery disease    a. NSTEMI 12/2015 - LHC 01/18/16: s/p overlapping DESx2 to RCA, 10% ost-prox Cx,10% mLAD.  Marland Kitchen Depression   . Diabetes mellitus   . Dyslipidemia   . GERD (gastroesophageal reflux disease)   . Heart attack ([place])   . Hypertension   . Hypertensive heart disease   . NSTEMI (non-ST elevated myocardial infarction) ([place]) 12/2015  . Pneumonia     Tobacco History: Social History   Tobacco Use  Smoking Status Former Smoker  . Packs/day: 1.00  . Years: 30.00  . Pack years: 30.00  . Types: Cigarettes  . Quit date: 01/09/2004  . Years since quitting: 15.0  Smokeless Tobacco Never Used   Counseling given: Not Answered   Outpatient Medications Prior to Visit  Medication Sig Dispense Refill  . acetaminophen-codeine (TYLENOL #3) 300-30 MG tablet TAKE 1 TABLET BY MOUTH EVERY 6 TO 8 HOURS AS NEEDED FOR PAIN    . albuterol (PROAIR HFA) 108 (90 Base) MCG/ACT inhaler Inhale 2 puffs into the lungs every 4 (four) hours as needed  for wheezing or shortness of breath. 1 Inhaler 5  . albuterol (PROVENTIL) (2.5 MG/3ML) 0.083% nebulizer solution Take 2.5 mg by nebulization every 4 (four) hours as needed for wheezing or shortness of breath (((PLAN C))).    Marland Kitchen ALPRAZolam (XANAX) 0.5 MG tablet Take 1 tablet (0.5 mg total) by mouth 2 (two) times daily as needed for anxiety. 30 tablet 0  . aspirin EC 81 MG tablet Take 81 mg by mouth daily.     Marland Kitchen atorvastatin (LIPITOR) 40 MG tablet Take 40 mg by mouth at bedtime.    . Biotin 5000 MCG CAPS Take 1 capsule by mouth  daily.    . budesonide (PULMICORT) 0.25 MG/2ML nebulizer solution One vial twice daily (Patient taking differently: Take 0.25 mg by nebulization 2 (two) times daily. One vial twice daily) 120 mL 12  . BYSTOLIC 2.5 MG tablet TAKE 1 TABLET EVERY DAY (Patient taking differently: Take 2.5 mg by mouth every morning. ) 90 tablet 0  . chlorpheniramine (CHLOR-TRIMETON) 4 MG tablet Take 4 mg by mouth every 4 (four) hours as needed for allergies.    Marland Kitchen dextromethorphan (DELSYM) 30 MG/5ML liquid 2 tsp every 12 hours as needed w/ flutter    . diltiazem (CARTIA XT) 240 MG 24 hr capsule Take 1 capsule (240 mg total) by mouth daily. You are overdue for a f/u. Please schedule and keep f/u to continue refills. Thank you. 30 capsule 0  . divalproex (DEPAKOTE ER) 500 MG 24 hr tablet Take 2 tablets (1,000 mg total) by mouth at bedtime. 60 tablet 11  . esomeprazole (NEXIUM) 20 MG capsule Take 20 mg by mouth every morning.     . formoterol (PERFOROMIST) 20 MCG/2ML nebulizer solution Take 2 mLs (20 mcg total) by nebulization every morning. 60 mL 3  . gabapentin (NEURONTIN) 300 MG capsule Take 300 mg by mouth at bedtime.    . hydrALAZINE (APRESOLINE) 25 MG tablet Take 1 tablet (25 mg total) by mouth 2 (two) times daily. Please make annual appt for future refills. Thank you 180 tablet 0  . metFORMIN (GLUCOPHAGE) 500 MG tablet Take 500 mg by mouth 2 (two) times daily with a meal.    . nitroGLYCERIN (NITROSTAT) 0.4 MG SL tablet DISSOLVE ONE TABLET UNDER THE TONGUE EVERY 5 MINUTES AS NEEDED FOR CHEST PAIN.  DO NOT EXCEED A TOTAL OF 3 DOSES IN 15 MINUTES 25 tablet 0  . potassium chloride SA (K-DUR,KLOR-CON) 20 MEQ tablet Take 20 mEq by mouth daily.     Marland Kitchen torsemide (DEMADEX) 20 MG tablet Take 20-60 mg by mouth daily. Take 1 tablet daily, can take another one or two as needed for swelling    . fluticasone (FLONASE) 50 MCG/ACT nasal spray Place 2 sprays into both nostrils daily as needed for allergies or rhinitis.    Marland Kitchen lansoprazole  (PREVACID) 30 MG capsule Take 30 mg by mouth at bedtime.    . clotrimazole-betamethasone (LOTRISONE) cream Apply 1 application topically 2 (two) times daily. (Patient not taking: Reported on 01/15/2019) 30 g 0  . HYDROcodone-acetaminophen (NORCO/VICODIN) 5-325 MG tablet Take 1-2 tablets by mouth every 4 (four) hours as needed. (Patient not taking: Reported on 01/15/2019) 10 tablet 0  . meloxicam (MOBIC) 15 MG tablet TAKE 1 TABLET BY MOUTH EVERY DAY FOR 15 DAYS    . methocarbamol (ROBAXIN) 500 MG tablet Take 1 tablet (500 mg total) by mouth 2 (two) times daily. (Patient not taking: Reported on 01/15/2019) 20 tablet 0  . sodium chloride (OCEAN)  0.65 % SOLN nasal spray Place 1 spray into both nostrils as needed for congestion.    . sucralfate (CARAFATE) 1 g tablet Take 1 tablet (1 g total) by mouth 4 (four) times daily. (Patient not taking: Reported on 01/15/2019) 30 tablet 0   No facility-administered medications prior to visit.      Review of Systems:   Constitutional:   No  weight loss, night sweats,  Fevers, chills, fatigue, or  lassitude.  HEENT:   No headaches,  Difficulty swallowing,  Tooth/dental problems, or  Sore throat,                No sneezing, itching, ear ache, + nasal congestion, post nasal drip,   CV:  No chest pain,  Orthopnea, PND, swelling in lower extremities, anasarca, dizziness, palpitations, syncope.   GI  No heartburn, indigestion, abdominal pain, nausea, vomiting, diarrhea, change in bowel habits, loss of appetite, bloody stools.   Resp:  .  No chest wall deformity  Skin: no rash or lesions.  GU: no dysuria, change in color of urine, no urgency or frequency.  No flank pain, no hematuria   MS:  No joint pain or swelling.  No decreased range of motion.  No back pain.    Physical Exam  BP 126/70 (BP Location: Left Arm, Cuff Size: Large)   Pulse 69   Temp (!) 97.2 F (36.2 C) (Temporal)   Ht 5\' 2"  (1.575 m)   Wt 166 lb 3.2 oz (75.4 kg)   LMP  (LMP Unknown)    SpO2 95%   BMI 30.40 kg/m   GEN: A/Ox3; pleasant , NAD, elderly    HEENT:  [place]/AT,    NOSE-clear, THROAT-clear, no lesions, no postnasal drip or exudate noted.   NECK:  Supple w/ fair ROM; no JVD; normal carotid impulses w/o bruits; no thyromegaly or nodules palpated; no lymphadenopathy.    RESP  Clear  P & A; w/o, wheezes/ rales/ or rhonchi. no accessory muscle use, no dullness to percussion  CARD:  RRR, no m/r/g, 1+ peripheral edema, pulses intact, no cyanosis or clubbing.  GI:   Soft & nt; nml bowel sounds; no organomegaly or masses detected.   Musco: Warm bil, no deformities or joint swelling noted.   Neuro: alert, no focal deficits noted.    Skin: Warm, no lesions or rashes    Lab Results:  CBC  BMET  BNP   Imaging: No results found.    PFT Results Latest Ref Rng & Units 04/18/2018  FVC-Pre L 1.19  FVC-Predicted Pre % 55  FVC-Post L 1.25  FVC-Predicted Post % 58  Pre FEV1/FVC % % 89  Post FEV1/FCV % % 91  FEV1-Pre L 1.05  FEV1-Predicted Pre % 64  FEV1-Post L 1.13  DLCO UNC% % 53  DLCO COR %Predicted % 147  TLC L 2.73  TLC % Predicted % 58  RV % Predicted % 64    Lab Results  Component Value Date   NITRICOXIDE 18 11/16/2017        Assessment & Plan:   Mild persistent chronic asthma without complication Doing well on her current regimen.  No flare with de-escalation of maintenance regimen. Continue on current regimen Patient's medications were reviewed today and patient education was given. Computerized medication calendar was adjusted/completed   Plan  Patient Instructions  Continue on Budesonide and Perforomist Neb daily  Restart Pepcid/Famotidine 10mg  At bedtime  .  Follow med calendar closely and bring to each visit  Follow up Dr. Melvyn Novas  Or Donnica Jarnagin NP In 4 month and As needed   Please contact office for sooner follow up if symptoms do not improve or worsen or seek emergency care       Allergic rhinitis Cont on trigger prevention    Plan  Patient Instructions  Continue on Budesonide and Perforomist Neb daily  Restart Pepcid/Famotidine 10mg  At bedtime  .  Follow med calendar closely and bring to each visit  Follow up Dr. Melvyn Novas  Or Karriem Muench NP In 4 month and As needed   Please contact office for sooner follow up if symptoms do not improve or worsen or seek emergency care       G E R D Cont on GERD diet  Restart Pepcid At bedtime   Cont on NExium daily      Rexene Edison, NP 01/15/2019

## 2019-01-15 NOTE — Assessment & Plan Note (Signed)
Doing well on her current regimen.  No flare with de-escalation of maintenance regimen. Continue on current regimen Patient's medications were reviewed today and patient education was given. Computerized medication calendar was adjusted/completed   Plan  Patient Instructions  Continue on Budesonide and Perforomist Neb daily  Restart Pepcid/Famotidine 10mg  At bedtime  .  Follow med calendar closely and bring to each visit  Follow up Dr. Melvyn Novas  Or Parrett NP In 4 month and As needed   Please contact office for sooner follow up if symptoms do not improve or worsen or seek emergency care

## 2019-01-15 NOTE — Addendum Note (Signed)
Addended by: Parke Poisson E on: 01/15/2019 03:53 PM   Modules accepted: Orders

## 2019-01-15 NOTE — Assessment & Plan Note (Signed)
Cont on trigger prevention   Plan  Patient Instructions  Continue on Budesonide and Perforomist Neb daily  Restart Pepcid/Famotidine 10mg  At bedtime  .  Follow med calendar closely and bring to each visit  Follow up Dr. Melvyn Novas  Or Parrett NP In 4 month and As needed   Please contact office for sooner follow up if symptoms do not improve or worsen or seek emergency care

## 2019-01-16 ENCOUNTER — Other Ambulatory Visit: Payer: Self-pay | Admitting: Internal Medicine

## 2019-01-16 NOTE — Telephone Encounter (Signed)
Appropriate for refill

## 2019-01-16 NOTE — Telephone Encounter (Signed)
Called pt to f/u on her wt and LEE.  Pt reports she has had no change in her swelling and her wt was up 0.4lbs.  She reports seeing Dr Melvyn Novas yesterday who advised her to increase to (3) tablets of Torsemide a day.  I reviewed with patient again just how extremely important it is for her to monitor and limit the amount of NA+ she consumes daily.  Reviewed no more than 2 gr/day and less than that would be better.  Also reviewed again with her to limit her fluid intake to no more than 2 L/day.  She states understanding and says she will try.  Encouragement given for importance of compliance and to avoid hospitalization.  She will c/b or have the [facility] c/b if no improvement.  Of note - she continue to states elevation of feet and legs does not help with anything and that she can not wear knee high compression stockings.

## 2019-01-22 ENCOUNTER — Telehealth: Payer: Self-pay | Admitting: Internal Medicine

## 2019-01-22 NOTE — Telephone Encounter (Signed)
LMOMTCB x 1 

## 2019-01-22 NOTE — Telephone Encounter (Signed)
I would ask her to look at the box and whether she takes it bid The standard dose is 0.25 mg bid but if she's been taking 0.5mg  bid ok to refill

## 2019-01-22 NOTE — Progress Notes (Signed)
Chart and office note reviewed in detail  > agree with a/p as outlined    

## 2019-01-22 NOTE — Telephone Encounter (Signed)
Dr Melvyn Novas- please advise before we refill budesonide what is the correct strength  She is requesting the 0.5 mg  Med list is Epic says 0.25 mg  Her med cal does not specify  Looks like she has had the 0.5 mg in the past but for some reason now [facility] has 0.25 listed  Please advise thanks

## 2019-01-23 MED ORDER — BUDESONIDE 0.5 MG/2ML IN SUSP: 0.5000 mg | Freq: Every day | RESPIRATORY_TRACT | 5 refills | Status: DC

## 2019-01-23 NOTE — Telephone Encounter (Signed)
Rx has been sent to pharmacy [facility] patient - a small child answered the phone stating that pt was not there.  WCB.

## 2019-01-23 NOTE — Telephone Encounter (Signed)
Budesonide 0.5 mg one daily is fine

## 2019-01-23 NOTE — Telephone Encounter (Signed)
Called spoke with patient. She states the dose is 0.5mg  daily. She used to be on BID but said Dr. Melvyn Novas went to once a day because she had been doing so well the last 3 months.  Dr. Melvyn Novas please advise on 0.5mg  daily or BID for RX

## 2019-01-24 NOTE — Telephone Encounter (Signed)
Called and spoke with Patient.  Patient stated she has already picked up and started Budesonide. Nothing further at this time.

## 2019-01-27 ENCOUNTER — Ambulatory Visit: Payer: Medicare Other | Admitting: Podiatry

## 2019-02-01 ENCOUNTER — Other Ambulatory Visit: Payer: Self-pay | Admitting: Nurse Practitioner

## 2019-02-01 ENCOUNTER — Other Ambulatory Visit: Payer: Self-pay | Admitting: Cardiology

## 2019-02-03 ENCOUNTER — Other Ambulatory Visit: Payer: Self-pay | Admitting: Cardiology

## 2019-02-03 MED ORDER — DILTIAZEM HCL ER COATED BEADS 240 MG PO CP24: 240.0000 mg | ORAL_CAPSULE | Freq: Every day | ORAL | 0 refills | Status: DC

## 2019-02-04 ENCOUNTER — Ambulatory Visit: Payer: Medicare Other | Admitting: Internal Medicine

## 2019-02-04 ENCOUNTER — Telehealth: Payer: Self-pay | Admitting: Cardiology

## 2019-02-04 ENCOUNTER — Encounter: Payer: Self-pay | Admitting: Internal Medicine

## 2019-02-04 ENCOUNTER — Ambulatory Visit (INDEPENDENT_AMBULATORY_CARE_PROVIDER_SITE_OTHER): Payer: Medicare Other | Admitting: Internal Medicine

## 2019-02-04 ENCOUNTER — Telehealth: Payer: Self-pay

## 2019-02-04 ENCOUNTER — Other Ambulatory Visit: Payer: Self-pay

## 2019-02-04 DIAGNOSIS — R05 Cough: Secondary | ICD-10-CM | POA: Diagnosis not present

## 2019-02-04 DIAGNOSIS — R058 Other specified cough: Secondary | ICD-10-CM

## 2019-02-04 DIAGNOSIS — J4530 Mild persistent asthma, uncomplicated: Secondary | ICD-10-CM | POA: Diagnosis not present

## 2019-02-04 NOTE — Telephone Encounter (Signed)
Spoke with patient who is requesting an appt to be evaluate by Dr Marlou Porch for chest pain/discomfort.  She reports it started about 3 AM.  She noticed it on her left side but felt like it was because of how she was laying.  She also reports she had eaten right before bed and thought it may be acid reflux.  She got up and took something for her acid reflux, sat up awhile and was nervous.  She then took Alprazolam and in a little while was able to lay back down and go to sleep.  She reports having a discomfort (dull pain) now from the top of her breast into her left shoulder.  She reports earlier the pain was about a 6 or 7 on a scale from 1-10.  She was going to go to the ER but has done this before and was told it was acid reflux.   No N/V, no SOB.  Moving her arms seems to make it worse.  She has been resting a lot today.  Appt scheduled with Dr Marlou Porch for 10/7.  Pt agreeable to virtual visit.  She will have her VS and medications available for review.  She will c/b prior to then if s/s change or increase.

## 2019-02-04 NOTE — Patient Instructions (Addendum)
Pt has MyChart to access these instructions.  Stop gabapentin completely since you are only taking one at bedtime but add it back if start coughing again for any reason and ok to build up to 4 x daily    If your breathing gets worse, increase the Performist to 20 mcg (one vial) every 12 hours instead of just once a day.   Please schedule a follow up office visit in 4-6  weeks, sooner if needed  with all medications /inhalers/ solutions in hand so we can verify exactly what you are taking. This includes all medications from all doctors and over the counters Also bring calendar

## 2019-02-04 NOTE — Telephone Encounter (Signed)

## 2019-02-04 NOTE — Assessment & Plan Note (Addendum)
-   CT sinus 08/06/14 > neg  - Allergy profile 02/23/16  >  Eos 0.0 /  IgE  87 pos  RAST  Grass/ trees  - improved 05/08/2016 on gabapentin 300 tid > no change rx > d/c'd 07/2016 DgEs 10/09/17  Low range of penetration without aspiration. Otherwise, normal - Asthma exac 12/17/17- FENO 18. Significant upper airway wheeze, tx pred taper. Add neurontin 100mg  TID.  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 > rec max  gerd rx   - 04/05/2018 pt did not recognize generic bottle of nexium or how it was supposed to be taken  - 04/05/2018 rechallenge with gabapentin 300  Qid  - PFT's  04/18/2018  FEV1 1.13 (68 % ) ratio 91  p 7 % improvement from saba p nothing prior to study with DLCO  53/60 % corrects to 147  % for alv volume  s curvature  - 02/04/2019 informed only using gabapentin 300 mg hs so ok to d/c   Doing much better overall but still struggling to maintain medications reconciliation    Each maintenance medication was reviewed in detail including most importantly the difference between maintenance and as needed and under what circumstances the prns are to be used.  Please see AVS for specific  Instructions which are unique to this visit and I personally typed out  which were reviewed in detail over the phone with the patient and a copy provided.       rec return in 4-6 weeks with all meds in hand using a trust but verify approach to confirm accurate Medication  Reconciliation The principal here is that until we are certain that the  patients are doing what we've asked, it makes no sense to ask them to do more.

## 2019-02-04 NOTE — Telephone Encounter (Signed)
New message:   Patient calling stating that she is having some pain in her left arm and having pain above her breast. Patient would like for someone to call concering this matter. Also would like a sooner appt.

## 2019-02-04 NOTE — Telephone Encounter (Signed)
YOUR CARDIOLOGY TEAM HAS ARRANGED FOR AN E-VISIT FOR YOUR APPOINTMENT - PLEASE REVIEW IMPORTANT INFORMATION BELOW SEVERAL DAYS PRIOR TO YOUR APPOINTMENT  Due to the recent COVID-19 pandemic, we are transitioning in-person office visits to tele-medicine visits in an effort to decrease unnecessary exposure to our patients, their families, and staff. These visits are billed to your insurance just like a normal visit is. We also encourage you to sign up for MyChart if you have not already done so. You will need a smartphone if possible. For patients that do not have this, we can still complete the visit using a regular telephone but do prefer a smartphone to enable video when possible. You may have a family member that lives with you that can help. If possible, we also ask that you have a blood pressure cuff and scale at home to measure your blood pressure, heart rate and weight prior to your scheduled appointment. Patients with clinical needs that need an in-person evaluation and testing will still be able to come to the office if absolutely necessary. If you have any questions, feel free to call our office.  YOUR PROVIDER WILL BE USING THE FOLLOWING PLATFORM TO COMPLETE YOUR VISIT:  [email or web address]   2-3 DAYS BEFORE YOUR APPOINTMENT  You will receive a telephone call from one of our [place] team members - your caller ID may say "Unknown caller." If this is a video visit, we will walk you through how to get the video launched on your phone. We will remind you check your blood pressure, heart rate and weight prior to your scheduled appointment. If you have an Apple Watch or Kardia, please upload any pertinent ECG strips the day before or morning of your appointment to [facility]. Our staff will also make sure you have reviewed the consent and agree to move forward with your scheduled tele-health visit.   THE DAY OF YOUR APPOINTMENT  Approximately 15 minutes prior to your scheduled appointment, you will receive a  telephone call from one of [facility] team - your caller ID may say "Unknown caller."  Our staff will confirm medications, vital signs for the day and any symptoms you may be experiencing. Please have this information available prior to the time of visit start. It may also be helpful for you to have a pad of paper and pen handy for any instructions given during your visit. They will also walk you through joining the smartphone meeting if this is a video visit.  CONSENT FOR TELE-HEALTH VISIT - PLEASE REVIEW  I hereby voluntarily request, consent and authorize [facility] and its employed or contracted physicians, physician assistants, nurse practitioners or other licensed health care professionals (the Practitioner), to provide me with telemedicine health care services (the "Services") as deemed necessary by the treating Practitioner. I acknowledge and consent to receive the Services by the Practitioner via telemedicine. I understand that the telemedicine visit will involve communicating with the Practitioner through live audiovisual communication technology and the disclosure of certain medical information by electronic transmission. I acknowledge that I have been given the opportunity to request an in-person assessment or other available alternative prior to the telemedicine visit and am voluntarily participating in the telemedicine visit.  I understand that I have the right to withhold or withdraw my consent to the use of telemedicine in the course of my care at any time, without affecting my right to future care or treatment, and that the Practitioner or I may terminate the telemedicine visit at any time. I  understand that I have the right to inspect all information obtained and/or recorded in the course of the telemedicine visit and may receive copies of available information for a reasonable fee.  I understand that some of the potential risks of receiving the Services via telemedicine include:  Marland Kitchen Delay  or interruption in medical evaluation due to technological equipment failure or disruption; . Information transmitted may not be sufficient (e.g. poor resolution of images) to allow for appropriate medical decision making by the Practitioner; and/or  . In rare instances, security protocols could fail, causing a breach of personal health information.  Furthermore, I acknowledge that it is my responsibility to provide information about my medical history, conditions and care that is complete and accurate to the best of my ability. I acknowledge that Practitioner's advice, recommendations, and/or decision may be based on factors not within their control, such as incomplete or inaccurate data provided by me or distortions of diagnostic images or specimens that may result from electronic transmissions. I understand that the practice of medicine is not an exact science and that Practitioner makes no warranties or guarantees regarding treatment outcomes. I acknowledge that I will receive a copy of this consent concurrently upon execution via email to the email address I last provided but may also request a printed copy by calling the office of [place].    I understand that my insurance will be billed for this visit.   I have read or had this consent read to me. . I understand the contents of this consent, which adequately explains the benefits and risks of the Services being provided via telemedicine.  . I have been provided ample opportunity to ask questions regarding this consent and the Services and have had my questions answered to my satisfaction. . I give my informed consent for the services to be provided through the use of telemedicine in my medical care  By participating in this telemedicine visit I agree to the above.

## 2019-02-04 NOTE — Progress Notes (Signed)
Subjective:   Patient ID: [name], female    DOB: 1950-11-25    MRN: [id number]    Brief patient profile:  13 yobf quit smoking 2005 with nl pfts 2012  with recurrent non-specific resp flares with nl pfts during flares c/w pseudoasthma.     History of Present Illness  01/24/2016  Extended post hosp f/u ov/ transition of care/ /Terance Pomplun re: dtca asthma/ vcd symb 160 and pred 5 x 10 mg daily  Chief Complaint  Patient presents with  . Follow-up    Pt. recently got out the [facility] for an asthma attack, Pt. states her breathing remains the same, coughing with some yellow to white mucus,Has been needing to use Ventolin more often  last doing well 2 months prior to OV   While on Symb 160/ gerd rx but not dosing ac as rec  Waking up prematurely x 2 months with cough/wheeze/ sob but never contacted me as per the instructions rec Plan A = Automatic = dulera 100 Take 2 puffs first thing in am and then another 2 puffs about 12 hours later.                                      Nexum 40 mg Take 30-60 min before first meal of the day and Pepcid AC 20 mg  at bedtime Work on inhaler technique  Plan B = Backup Only use your albuterol as a rescue medication Plan C = Crisis - only use your albuterol nebulizer if you first try Plan B and it fails to help > ok to use the nebulizer up to every 4 hours but if start needing it regularly call for immediate appointment GERD diet  Take delsym two tsp every 12 hours and use the flutter valve as much as possible and supplement if needed with  tramadol 50 mg up to 2 every 4 hours to suppress the urge to cough  Once you have eliminated the cough for 3 straight days try reducing the tramadol first,  then the delsym as tolerated.   Prednisone 5 mg x 8, then 6, then 4 then 2, then 1 daily x 2 days and off  please schedule a follow up office visit in 2 weeks, sooner if needed with all meds/ inhalers/ neb solutions in hand     09/18/2016  f/u ov/Jakson Delpilar re:  UACS vs  Asthma brought med calendar but not updated / on symb 160 / max gerd rx  Chief Complaint  Patient presents with  . Follow-up    Pt c/o occ wheezing. She has not used rescue inhaler or neb recently.   still feels wheezing  And doe = MMRC1 = can walk nl pace, flat grade, can't hurry or go uphills or steps s sob rec Change the symbicort to 80 Take 2 puffs first thing in am and then another 2 puffs about 12 hours later.  Work on inhaler technique:  Use the med calendar daily to organize your medications as a "checklist" See [name] NP in 4  weeks with all your medications> never happened    Admit date: 12/19/2017 Discharge date: 12/24/2017  D/c home with steroids taper, monitor blood glucose while on steroids She reports run out of pulmicort nebs, new prescription provided, she is to follow up with Dr Melvyn Novas to further discuss asthma/copd meds, she is concerned about cost of these meds  Discharge Diagnoses:  Active Hospital Problems   Diagnosis Date Noted  . Acute bronchitis with asthma with acute exacerbation 01/16/2016  . Acute renal failure superimposed on stage 3 chronic kidney disease ([place]) 07/17/2016  . Chronic diastolic CHF (congestive heart failure) ([place]) 07/17/2016  . Acute respiratory failure with hypoxia ([place])   . Upper airway cough syndrome vs Asthma  08/28/2014  . Diabetes mellitus type 2, controlled ([place]) 08/06/2014  . Essential hypertension 07/21/2008           12/28/2017 ext post hosp f/u ov/Lorma Heater re: confused with meds again / only slightly better breathing  Chief Complaint  Patient presents with  . Follow-up    Breathing has improved slightly. She has been out of brovana due to high cost x 3 days. She has had to use her albuterol inhaler and neb daily since then.   Dyspnea:  50 ft = About the same as usual Cough: after supper is the worse, but not much mucus Sleeping: wakes up coughing 30 degrees due to sensation of reflux    SABA use: last 5 h prior to OV    02: none  Sleeping at 30 degrees    rec Plan A = Automatic = Performist 20 mcg one vial  with pulmocort one half vial twice daily  Gabapentin 100 mg three times daily Nexium Take 30- 60 min before your first and last meals of the day and zantac (ranitidine) at bedtime  Along with chlorpheniramine 4 mg 1 or 2 also at bedtime (over the counter)  Continue prednisone 10 mg every day  Plan B = Backup for breathing Only use your albuterol as a rescue medication  Plan B = Backup for coughing  Add tramadol 50 mg 1-2 every 4 hours if needed  Plan C = Crisis - only use your albuterol nebulizer if you first try Plan B and it fails to help > ok to use the nebulizer up to every 4 hours but if start needing it regularly call for immediate appointment     03/11/2018  f/u ov/Peony Barner re: 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 Follow-up    Cough had improved and then started back 1 day ago.  Cough is now non prod.  She is using her albuterol inhaler 2 x daily on average. She has not needed albuterol neb.    Dyspnea:  MMRC2 = can't walk a nl pace on a flat grade s sob but does fine slow and flat slowed by knee  Cough: noct > day  Sleeping: 45 degrees due to subjective reflux despite max acid suppresion  SABA use: as above - never noct 02: none   rec Gabapentin 100 mg four  times daily  Take chortab 4 mg x 2 at bedtime and up to every 4 hours during the day as needed for drainage  Take delsym two tsp every 12 hours and supplement if needed with  tramadol 50 mg up to 2 every 4 hours to suppress the urge to cough. Swallowing water and/or using ice chips/non mint and menthol containing candies (such as lifesavers or sugarless jolly ranchers) are also effective.  You should rest your voice and avoid activities that you know make you cough. Once you have eliminated the cough for 3 straight days try reducing the tramadol first,  then the delsym as tolerated.  Plan D = Prednisone x 6 days  if not cough/ sob not better See calendar for specific medication instructions and bring it back for  each and every office visit for every healthcare provider you see.  Without it,  you may not receive the best quality medical care that we feel you deserve. Please schedule a follow up office visit in 4 weeks, call sooner if needed with all medications /inhalers/ solutions in hand so we can verify exactly what you are taking. This includes all medications from all doctors and over the [place] separate them into two bags:  the ones you take automatically, no matter what, vs the ones you take just when you feel you need them "BAG #2 is UP TO YOU"  - this will really help [place] help you take your medications more effectively.    03/15/18 back to ER - did not activate action plan prior (never took more than 1 tramadol every 12 hours to control cough.   03/20/2018  Extended post hops  f/u ov/Charvez Voorhies re: transition of care re   vcd vs asthma  Chief Complaint  Patient presents with  . Follow-up    recent admission 03/15/18- pt reports of wheezing, sob with exertion, no prod cough & chest tightness.   Dyspnea:  MMRC4  = sob if tries to leave home or while getting dressed   - no better now vs during hosp stay. Seen by Dr Lake Bells who strongly supported vcd dx during admit Cough: dry cough settles down overnight and with plb as does the "wheeze"  Sleeping: 45 degrees SABA use: last neb 8 h prior to OV  "did not help" 02: no  rec Increase gabapentin to 100 mg take 2 (=200)  four times daily as per med calendar For cough > delsym 2 tsp every 12 hours with use of flutter valve as much as possible  and supplement with tramadol 50 mg up to 1-2 every 4 hours as needed  For breathing problems not improving with use of nebulizer > Prednisone 10 mg take  4 each am x 2 days,   2 each am x 2 days,  1 each am x 2 days and stop  GERD diet / lifestyle recs Keep previous appt with your medications in 2 bags     04/05/2018  f/u ov/Saket Hellstrom re: uacs/ VCD - brought all meds but confused with gen vs trade names, could not tell me how she is supposed to take esmeprazole when I handed it to her.  Chief Complaint  Patient presents with  . Follow-up  Dyspnea:  50 ft  Cough: better  Sleeping: 45 degrees SABA use: twice a day hfa , rare neb 02: none   rec Gabapentin 300 mg four times daily  Budesonide 0.25 mcg twice daily with performist If breathing worse > Prednisone 10 mg take  4 each am x 2 days,   2 each am x 2 days,  1 each am x 2 days and stop  For cough > Take delsym two tsp every 12 hours and flutter valve as much as possible supplement if needed with  tramadol 50 mg (or tylenol #3) up to 2 every 4 hours to suppress the urge to cough. Swallowing water and/or using ice chips/non mint and menthol containing candies (such as lifesavers or sugarless jolly ranchers) are also effective.  You should rest your voice and avoid activities that you know make you cough. Once you have eliminated the cough for 3 straight days try reducing the tramadol first,  then the delsym as tolerated. Nexium (esmaprazole)  Take 30- 60 min before your first and last meals  of the day  Please schedule a follow up office visit in 2  weeks, sooner if needed - needs full pfts on return      04/18/2018  f/u ov/Borna Wessinger re: asthma vs vcd  - brought most but not all her meds - missing nexium, has budesonide in prn bag/ pill organizer has 2 slots per day and hard to read to top print on each slot Chief Complaint  Patient presents with  . Follow-up    PFT's done today. She c/o increased SOB for the past 3 days. She is using her albuterol inhaler and neb with albuterol both about 2 x per day.   Dyspnea:  Improved, able to do [employer] shopping now  Cough: varies s pattern, min mucoid  Sleeping: 45 degrees all pillows  SABA use: twice daily due to overdoing it at work rec Plan A = Automatic = Budesonide/perforomist twice daily as per med  calendar Plan B = Backup Only use your albuterol inhaler (prefer Proair- RED  but same as Ventolin-blue) as a rescue medication to be used if you can't catch your breath by resting or doing a relaxed purse lip breathing pattern.  - The less you use it, the better it will work when you need it. - Ok to use the inhaler up to 2 puffs  every 4 hours if you must but call for appointment if use goes up over your usual need - Don't leave home without it !!  (think of it like the spare tire for your car)  Plan C = Crisis - only use your albuterol nebulizer if you first try Plan B and it fails to help > ok to use the nebulizer up to every 4 hours but if start needing it regularly call for immediate appointment     11/04/2018  f/u ov/Doral Ventrella re: asthma with prob vcd component / did not bring meds or med calendar as req  Chief Complaint  Patient presents with  . Follow-up    Breathing is overall doing well and no new co's.   Dyspnea:  Not limited by breathing from desired activities   Cough: no Sleeping: on side now 2 pillows  SABA use: none 02: none  rec No change rx   NP recs  916/2020 Continue on Budesonide and Perforomist Neb daily  Restart Pepcid/Famotidine 10mg  At bedtime  .  Follow med calendar closely and bring to each visit     Virtual Visit via Telephone Note 02/04/2019   I connected with [name] on 02/04/19 at 10:45 AM EDT by telephone and verified that I am speaking with the correct person using two identifiers.   I discussed the limitations, risks, security and privacy concerns of performing an evaluation and management service by telephone and the availability of in person appointments. I also discussed with the patient that there may be a patient responsible charge related to this service. The patient expressed understanding and agreed to proceed.   History of Present Illness: asthma performist/bud q am  Dyspnea:  Walking for 20 min s stopping some hills are problems  Cough: none / just on gabapentin hs  Sleeping: able to lie flat/ 2 pillows  SABA use: has proair, has albuterol neb not using  02: none    No obvious day to day or daytime variability or assoc excess/ purulent sputum or mucus plugs or hemoptysis or cp or chest tightness, subjective wheeze or overt sinus or hb symptoms.    Also denies any obvious  fluctuation of symptoms with weather or environmental changes or other aggravating or alleviating factors except as outlined above.   Meds reviewed/ med reconciliation completed        Observations/Objective: Sounds great on phone, nl phonation s increased wob.   Assessment and Plan: See problem list for active a/p's   Follow Up Instructions: See avs for instructions unique to this ov which includes revised/ updated med list     I discussed the assessment and treatment plan with the patient. The patient was provided an opportunity to ask questions and all were answered. The patient agreed with the plan and demonstrated an understanding of the instructions.   The patient was advised to call back or seek an in-person evaluation if the symptoms worsen or if the condition fails to improve as anticipated.  I provided 25 minutes of non-face-to-face time during this encounter.   Christinia Gully, MD

## 2019-02-04 NOTE — Assessment & Plan Note (Signed)
Onset of symptoms reported  2012 p quit smoking  2005  - FEN0 01/24/2016 = 13 with active symptoms on symb 160 2bid > changed to dulera 100  - Spirometry 02/23/2016  NO signifiant obstruction with active symptoms  - 02/23/2016  After extensive coaching HFA effectiveness =    75%  - FENO 02/23/2016  =  12   On dulera 100 2bid (unable to confirm adeherence as did not bring it - Singulair 10 mg daily added 02/23/2016 to dulera 100 2bid with 2 week sample only  (unable to verify she used it) - Allergy profile 02/23/16  >  Eos 0.0 /  IgE  87 pos  RAST  Grass/ trees  - 04/07/2016   symb 80 x one sample given - FENO 04/07/2016  =   43 ? Really on meds ?  - 05/08/2016  After extensive coaching HFA effectiveness =    75% > continue symb 80 2bid - 07/17/2016 added singulair maint   - FENO 09/18/2016  =   15 on symb 160 2bid > try 80 2bid  - flare late feb 2019 ? What maint rx she was taking but on admit "non-adherent per notes 08/12/17  - 09/18/2017  After extensive coaching inhaler device  effectiveness =    75% from a baseline of 50% > restart symb 80 2bid - FENO 09/18/2017  =   20 on symb 160  - Spirometry 09/18/2017  No obstruction at all with active wheeze   All goals of chronic asthma control met including optimal function and elimination of symptoms with minimal need for rescue therapy.  Contingencies discussed in full including contacting this office immediately if not controlling the symptoms using the rule of two's.

## 2019-02-05 ENCOUNTER — Encounter: Payer: Self-pay | Admitting: Cardiology

## 2019-02-05 ENCOUNTER — Other Ambulatory Visit: Payer: Self-pay

## 2019-02-05 ENCOUNTER — Telehealth (INDEPENDENT_AMBULATORY_CARE_PROVIDER_SITE_OTHER): Payer: Medicare Other | Admitting: Cardiology

## 2019-02-05 ENCOUNTER — Telehealth: Payer: Medicare Other | Admitting: Cardiology

## 2019-02-05 VITALS — BP 153/79 | HR 72 | Ht 62.0 in | Wt 159.5 lb

## 2019-02-05 DIAGNOSIS — I10 Essential (primary) hypertension: Secondary | ICD-10-CM | POA: Diagnosis not present

## 2019-02-05 DIAGNOSIS — E7800 Pure hypercholesterolemia, unspecified: Secondary | ICD-10-CM | POA: Diagnosis not present

## 2019-02-05 DIAGNOSIS — I2510 Atherosclerotic heart disease of native coronary artery without angina pectoris: Secondary | ICD-10-CM

## 2019-02-05 DIAGNOSIS — I5032 Chronic diastolic (congestive) heart failure: Secondary | ICD-10-CM | POA: Diagnosis not present

## 2019-02-05 MED ORDER — HYDRALAZINE HCL 25 MG PO TABS: ORAL_TABLET | ORAL | 3 refills | Status: DC

## 2019-02-05 MED ORDER — DILTIAZEM HCL ER COATED BEADS 240 MG PO CP24: 240.0000 mg | ORAL_CAPSULE | Freq: Every day | ORAL | 3 refills | Status: DC

## 2019-02-05 NOTE — Patient Instructions (Signed)
Medication Instructions:  The current medical regimen is effective;  continue present plan and medications.  If you need a refill on your cardiac medications before your next appointment, please call your pharmacy.   Follow-Up: At [facility], you and your health needs are our priority.  As part of our continuing mission to provide you with exceptional heart care, we have created designated Provider Care Teams.  These Care Teams include your primary Cardiologist (physician) and Advanced Practice Providers (APPs -  Physician Assistants and Nurse Practitioners) who all work together to provide you with the care you need, when you need it. You will need a follow up appointment in 6 months with Laura Ingold, NP and Dr Skains in 1 year.  Please call our office 2 months in advance to schedule this appointment.  You may see Mark Skains, MD or one of the following Advanced Practice Providers on your designated Care Team:   Lori Gerhardt, NP Laura Ingold, NP . Jill McDaniel, NP  Thank you for choosing [facility]!!      

## 2019-02-05 NOTE — Progress Notes (Signed)
Virtual Visit via Video Note   This visit type was conducted due to national recommendations for restrictions regarding the COVID-19 Pandemic (e.g. social distancing) in an effort to limit this patient's exposure and mitigate transmission in our community.  Due to her co-morbid illnesses, this patient is at least at moderate risk for complications without adequate follow up.  This format is felt to be most appropriate for this patient at this time.  All issues noted in this document were discussed and addressed.  A limited physical exam was performed with this format.  Please refer to the patient's chart for her consent to telehealth for [facility].   Date:  02/05/2019   ID:  RAYLINN TRICE, DOB 1950/05/21, MRN [id number]  Patient Location: Home Provider Location: Home  PCP:  [name], MD  Cardiologist:  Candee Furbish, MD  Electrophysiologist:  None   Evaluation Performed:  Follow-Up Visit  Chief Complaint:  Leg edema follow up.  History of Present Illness:    CHAKYRA BORELL is a 68 y.o. female with asthma being treated by Dr. Melvyn Novas, FEV1 68% here for follow-up.  She has been in the emergency department last in August with atypical chest pain.  This was felt to be GERD related.  Delta troponin was normal.  She is also called in September for reports of leg swelling.  Previously was advised to increase torsemide to 3 tablets a day and dietary measures were explained.  Been doing really well. Sharp pain in arm and side and back. Worried her. GERD. Took reflux tablet at 3am. Took Xanax as well. Sat down. And pain eased down. Likely MSK. She retired. Should not be around too many peoples.   ECHOCardiogram 08/13/2017:  - Left ventricle: The cavity size was normal. There was moderate   concentric hypertrophy. Systolic function was vigorous. The   estimated ejection fraction was in the range of 65% to 70%. Wall   motion was normal; there were no regional wall motion  abnormalities. Features are consistent with a pseudonormal left   ventricular filling pattern, with concomitant abnormal relaxation   and increased filling pressure (grade 2 diastolic dysfunction).   Doppler parameters are consistent with high ventricular filling   pressure. - Mitral valve: There was trivial regurgitation. - Pulmonary arteries: PA peak pressure: 34 mm Hg (S).  The patient does not have symptoms concerning for COVID-19 infection (fever, chills, cough, or new shortness of breath).    Past Medical History:  Diagnosis Date  . Anxiety   . Asthma   . Bronchitis   . Chronic diastolic CHF (congestive heart failure) ([place])   . Chronic diastolic CHF (congestive heart failure) ([place]) 07/17/2016  . Coronary artery disease    a. NSTEMI 12/2015 - LHC 01/18/16: s/p overlapping DESx2 to RCA, 10% ost-prox Cx,10% mLAD.  Marland Kitchen Depression   . Diabetes mellitus   . Dyslipidemia   . GERD (gastroesophageal reflux disease)   . Heart attack ([place])   . Hypertension   . Hypertensive heart disease   . NSTEMI (non-ST elevated myocardial infarction) ([place]) 12/2015  . Pneumonia    Past Surgical History:  Procedure Laterality Date  . ABDOMINAL HYSTERECTOMY    . CARDIAC CATHETERIZATION  1994   minimal LAD dz, no other dz, EF normal  . CARDIAC CATHETERIZATION N/A 01/18/2016   Procedure: Left Heart Cath and Coronary Angiography;  Surgeon: Burnell Blanks, MD;  Location: [place] [facility];  Service: Cardiovascular;  Laterality: N/A;  . CARDIAC CATHETERIZATION  N/A 01/18/2016   Procedure: Coronary Stent Intervention;  Surgeon: Burnell Blanks, MD;  Location: [place] [facility];  Service: Cardiovascular;  Laterality: N/A;  . COLONOSCOPY WITH PROPOFOL N/A 04/09/2014   Procedure: COLONOSCOPY WITH PROPOFOL;  Surgeon: Cleotis Nipper, MD;  Location: [facility];  Service: Endoscopy;  Laterality: N/A;  . CORONARY STENT PLACEMENT  01/19/2016   STENT SYNERGY DES [id number] drug eluting stent was  successfully placed, and overlaps the 2.5 x 38 mm Synergy stent placed distally.  . ESOPHAGOGASTRODUODENOSCOPY (EGD) WITH PROPOFOL N/A 04/09/2014   Procedure: ESOPHAGOGASTRODUODENOSCOPY (EGD) WITH PROPOFOL;  Surgeon: Cleotis Nipper, MD;  Location: [facility];  Service: Endoscopy;  Laterality: N/A;  . HEMORRHOID SURGERY    . HERNIA REPAIR    . KNEE ARTHROSCOPY       Current Meds  Medication Sig  . acetaminophen-codeine (TYLENOL #3) 300-30 MG tablet TAKE 1 TABLET BY MOUTH EVERY 6 TO 8 HOURS AS NEEDED FOR PAIN  . albuterol (PROAIR HFA) 108 (90 Base) MCG/ACT inhaler Inhale 2 puffs into the lungs every 4 (four) hours as needed for wheezing or shortness of breath.  Marland Kitchen albuterol (PROVENTIL) (2.5 MG/3ML) 0.083% nebulizer solution Take 2.5 mg by nebulization every 4 (four) hours as needed for wheezing or shortness of breath (((PLAN C))).  Marland Kitchen ALPRAZolam (XANAX) 0.5 MG tablet Take 1 tablet (0.5 mg total) by mouth 2 (two) times daily as needed for anxiety.  Marland Kitchen aspirin EC 81 MG tablet Take 81 mg by mouth daily.   Marland Kitchen atorvastatin (LIPITOR) 40 MG tablet Take 40 mg by mouth at bedtime.  . Biotin 5000 MCG CAPS Take 1 capsule by mouth daily.  Marland Kitchen dextromethorphan (DELSYM) 30 MG/5ML liquid 2 tsp every 12 hours as needed w/ flutter  . diltiazem (CARTIA XT) 240 MG 24 hr capsule Take 1 capsule (240 mg total) by mouth daily.  . divalproex (DEPAKOTE ER) 500 MG 24 hr tablet Take 2 tablets (1,000 mg total) by mouth at bedtime.  Marland Kitchen esomeprazole (NEXIUM) 20 MG capsule Take 20 mg by mouth every morning.   . famotidine (PEPCID) 20 MG tablet Take 20 mg by mouth at bedtime.  . fluticasone (FLONASE) 50 MCG/ACT nasal spray Place 2 sprays into both nostrils daily as needed for allergies or rhinitis.  . formoterol (PERFOROMIST) 20 MCG/2ML nebulizer solution Take 2 mLs (20 mcg total) by nebulization every morning.  . hydrALAZINE (APRESOLINE) 25 MG tablet TAKE 1 TABLET BY MOUTH TWICE DAILY  . metFORMIN (GLUCOPHAGE) 500 MG tablet  Take 500 mg by mouth 2 (two) times daily with a meal.  . nebivolol (BYSTOLIC) 5 MG tablet Take 5 mg by mouth daily.  . nitroGLYCERIN (NITROSTAT) 0.4 MG SL tablet DISSOLVE ONE TABLET UNDER THE TONGUE EVERY 5 MINUTES AS NEEDED FOR CHEST PAIN.  DO NOT EXCEED A TOTAL OF 3 DOSES IN 15 MINUTES  . potassium chloride SA (K-DUR,KLOR-CON) 20 MEQ tablet Take 20 mEq by mouth daily.   Marland Kitchen torsemide (DEMADEX) 20 MG tablet Take 20-60 mg by mouth daily. Take 1 tablet daily, can take another one or two as needed for swelling  . [DISCONTINUED] diltiazem (CARTIA XT) 240 MG 24 hr capsule Take 1 capsule (240 mg total) by mouth daily. Please make overdue appt with Dr. Marlou Porch before anymore refills. 2nd attempt  . [DISCONTINUED] hydrALAZINE (APRESOLINE) 25 MG tablet TAKE 1 TABLET BY MOUTH TWICE DAILY ( PLEASE MAKE ANNUAL APPOINTMENT FOR FUTURE REFILLS. THANK YOU)     Allergies:   Patient has no known  allergies.   Social History   Tobacco Use  . Smoking status: Former Smoker    Packs/day: 1.00    Years: 30.00    Pack years: 30.00    Types: Cigarettes    Quit date: 01/09/2004    Years since quitting: 15.0  . Smokeless tobacco: Never Used  Substance Use Topics  . Alcohol use: No    Alcohol/week: 0.0 standard drinks  . Drug use: No     Family Hx: The patient's family history includes Allergies in her mother; Asthma in her mother; CAD (age of onset: 1) in her father; CAD (age of onset: 53) in her sister; Congestive Heart Failure in her sister; Diabetes in her father; Hypertension in her father.  ROS:   Please see the history of present illness.    No fevers chills nausea vomiting syncope bleeding All other systems reviewed and are negative.   Prior CV studies:   The following studies were reviewed today:  Cardiac cath 01/18/2016:  Mid RCA lesion, 70 %stenosed.  Dist RCA lesion, 90 %stenosed.  A STENT SYNERGY DES [id number] drug eluting stent was successfully placed, and overlaps the 2.5 x 38 mm Synergy  stent placed distally.  Prox RCA lesion, 80 %stenosed.  Post intervention, there is a 0% residual stenosis.  Ost Cx to Prox Cx lesion, 10 %stenosed.  Mid LAD lesion, 10 %stenosed.   1. NSTEMI 2. Severe single vessel CAD with severe stenosis in the mid and distal RCA 3. Successful PTCA/DES x 2 mid and distal RCA  Labs/Other Tests and Data Reviewed:    EKG: 12/02/2018-sinus rhythm with nonspecific ST-T wave changes  Recent Labs: 03/16/2018: B Natriuretic Peptide 129.2 03/17/2018: Magnesium 2.3 12/02/2018: ALT 11; BUN 25; Creatinine, Ser 1.14; Hemoglobin 11.2; Platelets 300; Potassium 4.3; Sodium 140   Recent Lipid Panel Lab Results  Component Value Date/Time   CHOL 196 07/18/2016 01:42 AM   TRIG 203 (H) 07/18/2016 01:42 AM   HDL 48 07/18/2016 01:42 AM   CHOLHDL 4.1 07/18/2016 01:42 AM   LDLCALC 107 (H) 07/18/2016 01:42 AM    Wt Readings from Last 3 Encounters:  02/05/19 159 lb 8 oz (72.3 kg)  01/15/19 166 lb 3.2 oz (75.4 kg)  12/02/18 166 lb (75.3 kg)     Objective:    Vital Signs:  BP (!) 153/79   Pulse 72   Ht 5\' 2"  (1.575 m)   Wt 159 lb 8 oz (72.3 kg)   LMP  (LMP Unknown)   BMI 29.17 kg/m      ASSESSMENT & PLAN:    CAD post non-STEMI 2017 - Continue with aggressive risk factor prevention.  RCA stenting previously.  Atypical chest pain in August 2020 ER visit.  Troponins were normal.  Atypical chest pain  - Likely MSK, GERD.  The fact that it is persistent left-sided pain points towards this etiology.  Reassuring troponins in the ER.  Stretches.  Asthma -Dr. Melvyn Novas.  Previously has been on treatment with steroids etc.  Trying to maintain compliance.  Lower extremity edema/chronic diastolic heart failure-grade 2 diastolic dysfunction - Improved with conservative measures.  On 10/07/2017 she had a lower extremity Doppler which was negative for DVTs.  Has had an ongoing issue.  She continues to take Demadex.  Continue with conservative measures, stockings etc.  Currently doing well. Wt is down.   Essential hypertension -Continue with good control.  Medications reviewed  Hyperlipidemia -Continue statin  COVID-19 Education: The signs and symptoms of COVID-19 were discussed with  the patient and how to seek care for testing (follow up with PCP or arrange E-visit).  The importance of social distancing was discussed today.  Time:   Today, I have spent 20 minutes with the patient with telehealth technology discussing the above problems.     Medication Adjustments/Labs and Tests Ordered: Current medicines are reviewed at length with the patient today.  Concerns regarding medicines are outlined above.   Tests Ordered: No orders of the defined types were placed in this encounter.   Medication Changes: Meds ordered this encounter  Medications  . diltiazem (CARTIA XT) 240 MG 24 hr capsule    Sig: Take 1 capsule (240 mg total) by mouth daily.    Dispense:  90 capsule    Refill:  3  . hydrALAZINE (APRESOLINE) 25 MG tablet    Sig: TAKE 1 TABLET BY MOUTH TWICE DAILY    Dispense:  180 tablet    Refill:  3    Follow Up:  Either In Person or Virtual Visit in 6 month(s) with APP, 12 months with me  Signed, Candee Furbish, MD  02/05/2019 2:23 PM    [place]

## 2019-03-12 ENCOUNTER — Ambulatory Visit (INDEPENDENT_AMBULATORY_CARE_PROVIDER_SITE_OTHER): Payer: Medicare Other | Admitting: Internal Medicine

## 2019-03-12 ENCOUNTER — Other Ambulatory Visit: Payer: Self-pay

## 2019-03-12 ENCOUNTER — Encounter: Payer: Self-pay | Admitting: Internal Medicine

## 2019-03-12 DIAGNOSIS — R058 Other specified cough: Secondary | ICD-10-CM

## 2019-03-12 DIAGNOSIS — R05 Cough: Secondary | ICD-10-CM | POA: Diagnosis not present

## 2019-03-12 NOTE — Patient Instructions (Signed)
No change in recommendations   See calendar for specific medication instructions and bring it back for each and every office visit for every healthcare provider you see.  Without it,  you may not receive the best quality medical care that we feel you deserve.  You will note that the calendar groups together  your maintenance  medications that are timed at particular times of the day.  Think of this as your checklist for what your doctor has instructed you to do until your next evaluation to see what benefit  there is  to staying on a consistent group of medications intended to keep you well.  The other group at the bottom is entirely up to you to use as you see fit  for specific symptoms that may arise between visits that require you to treat them on an as needed basis.  Think of this as your action plan or "what if" list.   Separating the top medications from the bottom group is fundamental to providing you adequate care going forward.     Please schedule a follow up visit in 6 months but call sooner if needed

## 2019-03-12 NOTE — Assessment & Plan Note (Signed)
-  CT sinus 08/06/14 > neg  - Allergy profile 02/23/16  >  Eos 0.0 /  IgE  87 pos  RAST  Grass/ trees  - improved 05/08/2016 on gabapentin 300 tid > no change rx > d/c'd 07/2016 DgEs 10/09/17  Low range of penetration without aspiration. Otherwise, normal - Asthma exac 12/17/17- FENO 18. Significant upper airway wheeze, tx pred taper. Add neurontin '100mg'$  TID.  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  Contingencies discussed in full including contacting this office immediately if not controlling the symptoms using the rule of two's.      Presently no vcd component or cough so ok to leave off gabapetin   >>> f/u in 6 m, sooner if needed   I had an extended discussion with the patient reviewing all relevant studies completed to date and  lasting 15 to 20 minutes of a 25 minute visit    Each maintenance medication was reviewed in detail including most importantly the difference between maintenance and prns and under what circumstances the prns are to be triggered using an action plan format that is not reflected in the computer generated alphabetically organized AVS but trather  by a customized med calendar that reflects the AVS meds with confirmed 100% correlation.   In addition, Please see AVS for unique instructions that I personally wrote and verbalized to the the pt in detail and then reviewed with pt  by my nurse highlighting any  changes in therapy recommended at today's visit to their plan of care.

## 2019-03-12 NOTE — Progress Notes (Signed)
Subjective:   Patient ID: [name], female    DOB: 1951-02-07    MRN: [id number]    Brief patient profile:  5- yobf quit smoking 2005 with nl pfts 2012  with recurrent non-specific resp flares with nl pfts during flares c/w pseudoasthma.     History of Present Illness  01/24/2016  Extended post hosp f/u ov/ transition of care/ /[name] re: dtca asthma/ vcd symb 160 and pred 5 x 10 mg daily  Chief Complaint  Patient presents with  . Follow-up    Pt. recently got out the [facility] for an asthma attack, Pt. states her breathing remains the same, coughing with some yellow to white mucus,Has been needing to use Ventolin more often  last doing well 2 months prior to OV   While on Symb 160/ gerd rx but not dosing ac as rec  Waking up prematurely x 2 months with cough/wheeze/ sob but never contacted me as per the instructions rec Plan A = Automatic = dulera 100 Take 2 puffs first thing in am and then another 2 puffs about 12 hours later.                                      Nexum 40 mg Take 30-60 min before first meal of the day and Pepcid AC 20 mg  at bedtime Work on inhaler technique  Plan B = Backup Only use your albuterol as a rescue medication Plan C = Crisis - only use your albuterol nebulizer if you first try Plan B and it fails to help > ok to use the nebulizer up to every 4 hours but if start needing it regularly call for immediate appointment GERD diet  Take delsym two tsp every 12 hours and use the flutter valve as much as possible and supplement if needed with  tramadol 50 mg up to 2 every 4 hours to suppress the urge to cough  Once you have eliminated the cough for 3 straight days try reducing the tramadol first,  then the delsym as tolerated.   Prednisone 5 mg x 8, then 6, then 4 then 2, then 1 daily x 2 days and off  please schedule a follow up office visit in 2 weeks, sooner if needed with all meds/ inhalers/ neb solutions in hand     09/18/2016  f/u ov/[name] re:  UACS vs  Asthma brought med calendar but not updated / on symb 160 / max gerd rx  Chief Complaint  Patient presents with  . Follow-up    Pt c/o occ wheezing. She has not used rescue inhaler or neb recently.   still feels wheezing  And doe = MMRC1 = can walk nl pace, flat grade, can't hurry or go uphills or steps s sob rec Change the symbicort to 80 Take 2 puffs first thing in am and then another 2 puffs about 12 hours later.  Work on inhaler technique:  Use the med calendar daily to organize your medications as a "checklist" See [name] NP in 4  weeks with all your medications> never happened    Admit date: 12/19/2017 Discharge date: 12/24/2017  D/c home with steroids taper, monitor blood glucose while on steroids She reports run out of pulmicort nebs, new prescription provided, she is to follow up with Dr Melvyn Novas to further discuss asthma/copd meds, she is concerned about cost of these meds  Discharge Diagnoses:  Active Hospital Problems   Diagnosis Date Noted  . Acute bronchitis with asthma with acute exacerbation 01/16/2016  . Acute renal failure superimposed on stage 3 chronic kidney disease ([place]) 07/17/2016  . Chronic diastolic CHF (congestive heart failure) ([place]) 07/17/2016  . Acute respiratory failure with hypoxia ([place])   . Upper airway cough syndrome vs Asthma  08/28/2014  . Diabetes mellitus type 2, controlled ([place]) 08/06/2014  . Essential hypertension 07/21/2008           12/28/2017 ext post hosp f/u ov/[name] re: confused with meds again / only slightly better breathing  Chief Complaint  Patient presents with  . Follow-up    Breathing has improved slightly. She has been out of brovana due to high cost x 3 days. She has had to use her albuterol inhaler and neb daily since then.   Dyspnea:  50 ft = About the same as usual Cough: after supper is the worse, but not much mucus Sleeping: wakes up coughing 30 degrees due to sensation of reflux    SABA use: last 5 h prior to OV    02: none  Sleeping at 30 degrees    rec Plan A = Automatic = Performist 20 mcg one vial  with pulmocort one half vial twice daily  Gabapentin 100 mg three times daily Nexium Take 30- 60 min before your first and last meals of the day and zantac (ranitidine) at bedtime  Along with chlorpheniramine 4 mg 1 or 2 also at bedtime (over the counter)  Continue prednisone 10 mg every day  Plan B = Backup for breathing Only use your albuterol as a rescue medication  Plan B = Backup for coughing  Add tramadol 50 mg 1-2 every 4 hours if needed  Plan C = Crisis - only use your albuterol nebulizer if you first try Plan B and it fails to help > ok to use the nebulizer up to every 4 hours but if start needing it regularly call for immediate appointment     03/11/2018  f/u ov/[name] re: 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 Follow-up    Cough had improved and then started back 1 day ago.  Cough is now non prod.  She is using her albuterol inhaler 2 x daily on average. She has not needed albuterol neb.    Dyspnea:  MMRC2 = can't walk a nl pace on a flat grade s sob but does fine slow and flat slowed by knee  Cough: noct > day  Sleeping: 45 degrees due to subjective reflux despite max acid suppresion  SABA use: as above - never noct 02: none   rec Gabapentin 100 mg four  times daily  Take chortab 4 mg x 2 at bedtime and up to every 4 hours during the day as needed for drainage  Take delsym two tsp every 12 hours and supplement if needed with  tramadol 50 mg up to 2 every 4 hours to suppress the urge to cough. Swallowing water and/or using ice chips/non mint and menthol containing candies (such as lifesavers or sugarless jolly ranchers) are also effective.  You should rest your voice and avoid activities that you know make you cough. Once you have eliminated the cough for 3 straight days try reducing the tramadol first,  then the delsym as tolerated.  Plan D = Prednisone x 6 days  if not cough/ sob not better See calendar for specific medication instructions and bring it back for  each and every office visit for every healthcare provider you see.  Without it,  you may not receive the best quality medical care that we feel you deserve. Please schedule a follow up office visit in 4 weeks, call sooner if needed with all medications /inhalers/ solutions in hand so we can verify exactly what you are taking. This includes all medications from all doctors and over the [place] separate them into two bags:  the ones you take automatically, no matter what, vs the ones you take just when you feel you need them "BAG #2 is UP TO YOU"  - this will really help [place] help you take your medications more effectively.    03/15/18 back to ER - did not activate action plan prior (never took more than 1 tramadol every 12 hours to control cough.   03/20/2018  Extended post hops  f/u ov/Malvern Kadlec re: transition of care re   vcd vs asthma  Chief Complaint  Patient presents with  . Follow-up    recent admission 03/15/18- pt reports of wheezing, sob with exertion, no prod cough & chest tightness.   Dyspnea:  MMRC4  = sob if tries to leave home or while getting dressed   - no better now vs during hosp stay. Seen by Dr Lake Bells who strongly supported vcd dx during admit Cough: dry cough settles down overnight and with plb as does the "wheeze"  Sleeping: 45 degrees SABA use: last neb 8 h prior to OV  "did not help" 02: no  rec Increase gabapentin to 100 mg take 2 (=200)  four times daily as per med calendar For cough > delsym 2 tsp every 12 hours with use of flutter valve as much as possible  and supplement with tramadol 50 mg up to 1-2 every 4 hours as needed  For breathing problems not improving with use of nebulizer > Prednisone 10 mg take  4 each am x 2 days,   2 each am x 2 days,  1 each am x 2 days and stop  GERD diet / lifestyle recs Keep previous appt with your medications in 2 bags     04/05/2018  f/u ov/Marquetta Weiskopf re: uacs/ VCD - brought all meds but confused with gen vs trade names, could not tell me how she is supposed to take esmeprazole when I handed it to her.  Chief Complaint  Patient presents with  . Follow-up  Dyspnea:  50 ft  Cough: better  Sleeping: 45 degrees SABA use: twice a day hfa , rare neb 02: none   rec Gabapentin 300 mg four times daily  Budesonide 0.25 mcg twice daily with performist If breathing worse > Prednisone 10 mg take  4 each am x 2 days,   2 each am x 2 days,  1 each am x 2 days and stop  For cough > Take delsym two tsp every 12 hours and flutter valve as much as possible supplement if needed with  tramadol 50 mg (or tylenol #3) up to 2 every 4 hours to suppress the urge to cough. Swallowing water and/or using ice chips/non mint and menthol containing candies (such as lifesavers or sugarless jolly ranchers) are also effective.  You should rest your voice and avoid activities that you know make you cough. Once you have eliminated the cough for 3 straight days try reducing the tramadol first,  then the delsym as tolerated. Nexium (esmaprazole)  Take 30- 60 min before your first and last meals  of the day  Please schedule a follow up office visit in 2  weeks, sooner if needed - needs full pfts on return      04/18/2018  f/u ov/Mccall Will re: asthma vs vcd  - brought most but not all her meds - missing nexium, has budesonide in prn bag/ pill organizer has 2 slots per day and hard to read to top print on each slot Chief Complaint  Patient presents with  . Follow-up    PFT's done today. She c/o increased SOB for the past 3 days. She is using her albuterol inhaler and neb with albuterol both about 2 x per day.   Dyspnea:  Improved, able to do [employer] shopping now  Cough: varies s pattern, min mucoid  Sleeping: 45 degrees all pillows  SABA use: twice daily due to overdoing it at work rec Plan A = Automatic = Budesonide/perforomist twice daily as per med  calendar Plan B = Backup Only use your albuterol inhaler (prefer Proair- RED  but same as Ventolin-blue) as a rescue medication to be used if you can't catch your breath by resting or doing a relaxed purse lip breathing pattern.  - The less you use it, the better it will work when you need it. - Ok to use the inhaler up to 2 puffs  every 4 hours if you must but call for appointment if use goes up over your usual need - Don't leave home without it !!  (think of it like the spare tire for your car)  Plan C = Crisis - only use your albuterol nebulizer if you first try Plan B and it fails to help > ok to use the nebulizer up to every 4 hours but if start needing it regularly call for immediate appointment    02/04/19 televisit Stop gabapentin completely since you are only taking one at bedtime but add it back if start coughing again for any reason and ok to build up to 4 x daily  If your breathing gets worse, increase the Performist to 20 mcg (one vial) every 12 hours instead of just once a day.   03/12/2019  f/u ov/Sherline Eberwein re: asthma with component of vcd / brought med calendar but not keeping it updated  Chief Complaint  Patient presents with  . Follow-up    Breathing is doing well today.  She rarely uses her albuterol inhaler or albuterol neb.    Dyspnea:  Not limited by breathing from desired activities  / walking neighborhood / chasing around gchildren Cough: none/ no need for prns Sleeping: on side 2 pillows SABA use: none / minimal need for prednisone or any prn x xanax 02: none    No obvious day to day or daytime variability or assoc excess/ purulent sputum or mucus plugs or hemoptysis or cp or chest tightness, subjective wheeze or overt sinus or hb symptoms.   Sleeping  without nocturnal  or early am exacerbation  of respiratory  c/o's or need for noct saba. Also denies any obvious fluctuation of symptoms with weather or environmental changes or other aggravating or alleviating factors  except as outlined above   No unusual exposure hx or h/o childhood pna/ asthma or knowledge of premature birth.  Current Allergies, Complete Past Medical History, Past Surgical History, Family History, and Social History were reviewed in [employer] record.  ROS  The following are not active complaints unless bolded Hoarseness, sore throat, dysphagia, dental problems, itching, sneezing,  nasal congestion or  discharge of excess mucus or purulent secretions, ear ache,   fever, chills, sweats, unintended wt loss or wt gain, classically pleuritic or exertional cp,  orthopnea pnd or arm/hand swelling  or leg swelling, presyncope, palpitations, abdominal pain, anorexia, nausea, vomiting, diarrhea  or change in bowel habits or change in bladder habits, change in stools or change in urine, dysuria, hematuria,  rash, arthralgias, visual complaints, headache, numbness, weakness or ataxia or problems with walking or coordination,  change in mood or  memory.        Current Meds  Medication Sig  . albuterol (PROAIR HFA) 108 (90 Base) MCG/ACT inhaler Inhale 2 puffs into the lungs every 4 (four) hours as needed for wheezing or shortness of breath.  Marland Kitchen albuterol (PROVENTIL) (2.5 MG/3ML) 0.083% nebulizer solution Take 2.5 mg by nebulization every 4 (four) hours as needed for wheezing or shortness of breath (((PLAN C))).  Marland Kitchen ALPRAZolam (XANAX) 0.5 MG tablet Take 1 tablet (0.5 mg total) by mouth 2 (two) times daily as needed for anxiety.  Marland Kitchen aspirin EC 81 MG tablet Take 81 mg by mouth daily.   Marland Kitchen atorvastatin (LIPITOR) 40 MG tablet Take 40 mg by mouth at bedtime.  . Biotin 5000 MCG CAPS Take 1 capsule by mouth daily.  . budesonide (PULMICORT) 0.5 MG/2ML nebulizer solution Take 2 mLs (0.5 mg total) by nebulization daily.  . chlorpheniramine (CHLOR-TRIMETON) 4 MG tablet Take 4 mg by mouth every 4 (four) hours as needed for allergies.  Marland Kitchen dextromethorphan (DELSYM) 30 MG/5ML liquid 2 tsp every 12  hours as needed w/ flutter  . diltiazem (CARTIA XT) 240 MG 24 hr capsule Take 1 capsule (240 mg total) by mouth daily.  . divalproex (DEPAKOTE ER) 500 MG 24 hr tablet Take 2 tablets (1,000 mg total) by mouth at bedtime.  Marland Kitchen esomeprazole (NEXIUM) 20 MG capsule Take 20 mg by mouth every morning.   . formoterol (PERFOROMIST) 20 MCG/2ML nebulizer solution Take 2 mLs (20 mcg total) by nebulization every morning.  . hydrALAZINE (APRESOLINE) 25 MG tablet TAKE 1 TABLET BY MOUTH TWICE DAILY  . metFORMIN (GLUCOPHAGE) 500 MG tablet Take 500 mg by mouth 2 (two) times daily with a meal.  . nebivolol (BYSTOLIC) 5 MG tablet Take 5 mg by mouth daily.  . nitroGLYCERIN (NITROSTAT) 0.4 MG SL tablet DISSOLVE ONE TABLET UNDER THE TONGUE EVERY 5 MINUTES AS NEEDED FOR CHEST PAIN.  DO NOT EXCEED A TOTAL OF 3 DOSES IN 15 MINUTES  . potassium chloride SA (K-DUR,KLOR-CON) 20 MEQ tablet Take 20 mEq by mouth daily.   Marland Kitchen torsemide (DEMADEX) 20 MG tablet Take 20-60 mg by mouth daily. Take 1 tablet daily, can take another one or two as needed for swelling                   Objective:   Physical Exam  03/12/2019   157  11/04/2018       157  02/23/2016   168 >  04/07/2016  168 > 05/08/2016    168 >  07/17/2016  162 > 09/18/2016    164  > 09/18/2017  160 > 12/28/2017 167  > 03/11/2018  164 > 03/20/2018   160 > 04/05/2018   171 > 04/18/2018  165     amb bf nad   BP 136/74 (BP Location: Left Arm, Cuff Size: Normal)   Pulse 69   Temp (!) 97.3 F (36.3 C) (Temporal)   Ht 5\' 2"  (1.575 m)   Wt 157 lb (  71.2 kg)   LMP  (LMP Unknown)   SpO2 100% Comment: on RA  BMI 28.72 kg/m      HEENT : pt wearing mask not removed for exam due to covid -19 concerns.    NECK :  without JVD/Nodes/TM/ nl carotid upstrokes bilaterally   LUNGS: no acc muscle use,  Nl contour chest which is clear to A and P bilaterally without cough on insp or exp maneuvers   CV:  RRR  no s3 or murmur or increase in P2, and no edema   ABD:  soft and  nontender with nl inspiratory excursion in the supine position. No bruits or organomegaly appreciated, bowel sounds nl  MS:  Nl gait/ ext warm without deformities, calf tenderness, cyanosis or clubbing No obvious joint restrictions   SKIN: warm and dry without lesions    NEURO:  alert, approp, nl sensorium with  no motor or cerebellar deficits apparent.

## 2019-03-19 ENCOUNTER — Encounter: Payer: Self-pay | Admitting: Podiatry

## 2019-03-19 ENCOUNTER — Ambulatory Visit: Payer: Medicare Other | Admitting: Podiatry

## 2019-03-19 ENCOUNTER — Other Ambulatory Visit: Payer: Self-pay

## 2019-03-19 DIAGNOSIS — B353 Tinea pedis: Secondary | ICD-10-CM | POA: Diagnosis not present

## 2019-03-19 DIAGNOSIS — E1165 Type 2 diabetes mellitus with hyperglycemia: Secondary | ICD-10-CM

## 2019-03-19 DIAGNOSIS — M79674 Pain in right toe(s): Secondary | ICD-10-CM

## 2019-03-19 DIAGNOSIS — B351 Tinea unguium: Secondary | ICD-10-CM

## 2019-03-19 DIAGNOSIS — IMO0002 Reserved for concepts with insufficient information to code with codable children: Secondary | ICD-10-CM

## 2019-03-19 NOTE — Patient Instructions (Addendum)
To prevent re-infection of athlete's feet, clean bathtub or shower with bleach based cleanser. Wear mask when cleaning tub/shower. Ventilate bathroom after cleaning tub/shower due to your asthma.  Athlete's Foot  Athlete's foot (tinea pedis) is a fungal infection of the skin on your feet. It often occurs on the skin that is between or underneath the toes. It can also occur on the soles of your feet. The infection can spread from person to person (is contagious). It can also spread when a person's bare feet come in contact with the fungus on shower floors or on items such as shoes. What are the causes? This condition is caused by a fungus that grows in warm, moist places. You can get athlete's foot by sharing shoes, shower stalls, towels, and wet floors with someone who is infected. Not washing your feet or changing your socks often enough can also lead to athlete's foot. What increases the risk? This condition is more likely to develop in:  Men.  People who have a weak body defense system (immune system).  People who have diabetes.  People who use public showers, such as at a gym.  People who wear heavy-duty shoes, such as Environmental manager.  Seasons with warm, humid weather. What are the signs or symptoms? Symptoms of this condition include:  Itchy areas between your toes or on the soles of your feet.  White, flaky, or scaly areas between your toes or on the soles of your feet.  Very itchy small blisters between your toes or on the soles of your feet.  Small cuts in your skin. These cuts can become infected.  Thick or discolored toenails. How is this diagnosed? This condition may be diagnosed with a physical exam and a review of your medical history. Your health care provider may also take a skin or toenail sample to examine under a microscope. How is this treated? This condition is treated with antifungal medicines. These may be applied as powders, ointments, or  creams. In severe cases, an oral antifungal medicine may be given. Follow these instructions at home: Medicines  Apply or take over-the-counter and prescription medicines only as told by your health care provider.  Apply your antifungal medicine as told by your health care provider. Do not stop using the antifungal even if your condition improves. Foot care  Do not scratch your feet.  Keep your feet dry: ? Wear cotton or wool socks. Change your socks every day or if they become wet. ? Wear shoes that allow air to flow, such as sandals or canvas tennis shoes.  Wash and dry your feet, including the area between your toes. Also, wash and dry your feet: ? Every day or as told by your health care provider. ? After exercising. General instructions  Do not let others use towels, shoes, nail clippers, or other personal items that touch your feet.  Protect your feet by wearing sandals in wet areas, such as locker rooms and shared showers.  Keep all follow-up visits as told by your health care provider. This is important.  If you have diabetes, keep your blood sugar under control. Contact a health care provider if:  You have a fever.  You have swelling, soreness, warmth, or redness in your foot.  Your feet are not getting better with treatment.  Your symptoms get worse.  You have new symptoms. Summary  Athlete's foot (tinea pedis) is a fungal infection of the skin on your feet. It often occurs on skin that  is between or underneath the toes.  This condition is caused by a fungus that grows in warm, moist places.  Symptoms include white, flaky, or scaly areas between your toes or on the soles of your feet.  This condition is treated with antifungal medicines.  Keep your feet clean. Always dry them thoroughly. This information is not intended to replace advice given to you by your health care provider. Make sure you discuss any questions you have with your health care provider.  Document Released: 04/14/2000 Document Revised: 04/12/2017 Document Reviewed: 02/05/2017 Elsevier Patient Education  [place].   Diabetes Mellitus and [place] care is an important part of your health, especially when you have diabetes. Diabetes may cause you to have problems because of poor blood flow (circulation) to your feet and legs, which can cause your skin to:  Become thinner and drier.  Break more easily.  Heal more slowly.  Peel and crack. You may also have nerve damage (neuropathy) in your legs and feet, causing decreased feeling in them. This means that you may not notice minor injuries to your feet that could lead to more serious problems. Noticing and addressing any potential problems early is the best way to prevent future foot problems. How to care for your feet Foot hygiene  Wash your feet daily with warm water and mild soap. Do not use hot water. Then, pat your feet and the areas between your toes until they are completely dry. Do not soak your feet as this can dry your skin.  Trim your toenails straight across. Do not dig under them or around the cuticle. File the edges of your nails with an emery board or nail file.  Apply a moisturizing lotion or petroleum jelly to the skin on your feet and to dry, brittle toenails. Use lotion that does not contain alcohol and is unscented. Do not apply lotion between your toes. Shoes and socks  Wear clean socks or stockings every day. Make sure they are not too tight. Do not wear knee-high stockings since they may decrease blood flow to your legs.  Wear shoes that fit properly and have enough cushioning. Always look in your shoes before you put them on to be sure there are no objects inside.  To break in new shoes, wear them for just a few hours a day. This prevents injuries on your feet. Wounds, scrapes, corns, and calluses  Check your feet daily for blisters, cuts, bruises, sores, and redness. If you cannot see  the bottom of your feet, use a mirror or ask someone for help.  Do not cut corns or calluses or try to remove them with medicine.  If you find a minor scrape, cut, or break in the skin on your feet, keep it and the skin around it clean and dry. You may clean these areas with mild soap and water. Do not clean the area with peroxide, alcohol, or iodine.  If you have a wound, scrape, corn, or callus on your foot, look at it several times a day to make sure it is healing and not infected. Check for: ? Redness, swelling, or pain. ? Fluid or blood. ? Warmth. ? Pus or a bad smell. General instructions  Do not cross your legs. This may decrease blood flow to your feet.  Do not use heating pads or hot water bottles on your feet. They may burn your skin. If you have lost feeling in your feet or legs, you may not  know this is happening until it is too late.  Protect your feet from hot and cold by wearing shoes, such as at the beach or on hot pavement.  Schedule a complete foot exam at least once a year (annually) or more often if you have foot problems. If you have foot problems, report any cuts, sores, or bruises to your health care provider immediately. Contact a health care provider if:  You have a medical condition that increases your risk of infection and you have any cuts, sores, or bruises on your feet.  You have an injury that is not healing.  You have redness on your legs or feet.  You feel burning or tingling in your legs or feet.  You have pain or cramps in your legs and feet.  Your legs or feet are numb.  Your feet always feel cold.  You have pain around a toenail. Get help right away if:  You have a wound, scrape, corn, or callus on your foot and: ? You have pain, swelling, or redness that gets worse. ? You have fluid or blood coming from the wound, scrape, corn, or callus. ? Your wound, scrape, corn, or callus feels warm to the touch. ? You have pus or a bad smell coming  from the wound, scrape, corn, or callus. ? You have a fever. ? You have a red line going up your leg. Summary  Check your feet every day for cuts, sores, red spots, swelling, and blisters.  Moisturize feet and legs daily.  Wear shoes that fit properly and have enough cushioning.  If you have foot problems, report any cuts, sores, or bruises to your health care provider immediately.  Schedule a complete foot exam at least once a year (annually) or more often if you have foot problems. This information is not intended to replace advice given to you by your health care provider. Make sure you discuss any questions you have with your health care provider. Document Released: 04/14/2000 Document Revised: 05/30/2017 Document Reviewed: 05/19/2016 Elsevier Patient Education  2020 [employer].

## 2019-03-20 ENCOUNTER — Ambulatory Visit: Payer: Medicare Other | Admitting: Neurology

## 2019-03-20 ENCOUNTER — Encounter: Payer: Self-pay | Admitting: Neurology

## 2019-03-20 VITALS — BP 143/84 | HR 79 | Temp 96.8°F | Ht 62.0 in | Wt 154.2 lb

## 2019-03-20 DIAGNOSIS — R413 Other amnesia: Secondary | ICD-10-CM | POA: Insufficient documentation

## 2019-03-20 DIAGNOSIS — R569 Unspecified convulsions: Secondary | ICD-10-CM | POA: Diagnosis not present

## 2019-03-20 NOTE — Progress Notes (Addendum)
[facility]  PATIENT: [name] DOB: 12/23/1950   REASON FOR VISIT: Follow-up for seizure disorder HISTORY FROM: Patient  HISTORY OF PRESENT ILLNESS:[name] is a 68 years old left-handed female, seen in refer by her primary care doctor [name] for evaluation of episodes of confusion on January 27 2016, I reviewed and summarized the referring note, she had a history of type 2 diabetes, hypertension, hyperlipidemia, pulmonary emboli and, depression, COPD, coronary artery disease, stent placement following a heart attack in January 18 2016, I reviewed echo report on January 18 2016, ejection fraction 65-70%, severe concentric hypertrophy, septal wall thickness was increased, with mild hypertrophy of the posterior wall, abnormal relaxation, increased filling pressure, I also reviewed laboratory evaluation BMP showed elevated glucose 270, creatinine 1.06, cholesterol 207, LDL 110, A1c 7.0, She had her first generalized seizure in 2011, was taken to [place] evaluated by neurologist there, per patient, there was a suggestion of antiepileptic medications, however it was never followed through.  Over the years, she had no generalized seizure, but every few weeks to every few months, she would have episodes of sudden onset confusion, space out, lasting less than 5 minutes  She went through extreme stress (her grandson died) in June 06, 2015, since then, she began to have increased spells, almost daily basis now, she noted when she was reading, cooking, has transient time elapsed, she came to confused, sometimes preceded by nausea dry mouth sensation. She denies family history of seizure, she works as a Midwife at [employer].  UPDATE May 11 2016:Jacqueline She is now taking Depakote ER 500 mg every night, which has helped her tremendously, she no longer has recurrent spells, EEG was normal in October 2017 We have personally  reviewed MRI of the brain in November 2017, multiple periventricular and subcortical punctate scattered chronic small vessel disease, increased T2 flare hyperintensity in the mesial temporal lobe, right hippocampus is slightly smaller than the left,  Update March 20, 2019: She had no recurrent seizure, last seizure was in 2016, tolerating Depakote ER 500 mg 2 tablets at nighttime,  Today her main concern is intermittent memory loss, she retired as a Chief Financial Officer recently, she was noted to have difficulty for the store layout since 2019, she denied a family history of dementia.   REVIEW OF SYSTEMS: Full 14 system review of systems performed and notable only for those listed, all others are neg:  As above  ALLERGIES: No Known Allergies  HOME MEDICATIONS: Outpatient Medications Prior to Visit  Medication Sig Dispense Refill  . acetaminophen-codeine (TYLENOL #3) 300-30 MG tablet TAKE 1 TABLET BY MOUTH EVERY 6 TO 8 HOURS AS NEEDED FOR PAIN    . albuterol (PROAIR HFA) 108 (90 Base) MCG/ACT inhaler Inhale 2 puffs into the lungs every 4 (four) hours as needed for wheezing or shortness of breath. 1 Inhaler 5  . albuterol (PROVENTIL) (2.5 MG/3ML) 0.083% nebulizer solution Take 2.5 mg by nebulization every 4 (four) hours as needed for wheezing or shortness of breath (((PLAN C))).    Marland Kitchen ALPRAZolam (XANAX) 0.5 MG tablet Take 1 tablet (0.5 mg total) by mouth 2 (two) times daily as needed for anxiety. 30 tablet 0  . aspirin EC 81 MG tablet Take 81 mg by mouth daily.     Marland Kitchen atorvastatin (LIPITOR) 40 MG tablet Take 40 mg by mouth at bedtime.    . Biotin 5000 MCG CAPS Take 1 capsule by mouth daily.    Marland Kitchen  budesonide (PULMICORT) 0.5 MG/2ML nebulizer solution Take 2 mLs (0.5 mg total) by nebulization daily. 60 mL 5  . chlorpheniramine (CHLOR-TRIMETON) 4 MG tablet Take 4 mg by mouth every 4 (four) hours as needed for allergies.    Marland Kitchen dextromethorphan (DELSYM) 30 MG/5ML liquid 2 tsp every 12 hours  as needed w/ flutter    . diltiazem (CARTIA XT) 240 MG 24 hr capsule Take 1 capsule (240 mg total) by mouth daily. 90 capsule 3  . divalproex (DEPAKOTE ER) 500 MG 24 hr tablet Take 2 tablets (1,000 mg total) by mouth at bedtime. 60 tablet 11  . esomeprazole (NEXIUM) 20 MG capsule Take 20 mg by mouth every morning.     . formoterol (PERFOROMIST) 20 MCG/2ML nebulizer solution Take 2 mLs (20 mcg total) by nebulization every morning. 60 mL 3  . hydrALAZINE (APRESOLINE) 25 MG tablet TAKE 1 TABLET BY MOUTH TWICE DAILY 180 tablet 3  . metFORMIN (GLUCOPHAGE) 500 MG tablet Take 500 mg by mouth 2 (two) times daily with a meal.    . nebivolol (BYSTOLIC) 5 MG tablet Take 5 mg by mouth daily.    . nitroGLYCERIN (NITROSTAT) 0.4 MG SL tablet DISSOLVE ONE TABLET UNDER THE TONGUE EVERY 5 MINUTES AS NEEDED FOR CHEST PAIN.  DO NOT EXCEED A TOTAL OF 3 DOSES IN 15 MINUTES 25 tablet 0  . potassium chloride SA (K-DUR,KLOR-CON) 20 MEQ tablet Take 20 mEq by mouth daily.     Marland Kitchen torsemide (DEMADEX) 20 MG tablet Take 20-60 mg by mouth daily. Take 1 tablet daily, can take another one or two as needed for swelling    . predniSONE (DELTASONE) 10 MG tablet 4 x 2 days, 2 x 2 days, 1 x 2 days then stop as needed     No facility-administered medications prior to visit.     PAST MEDICAL HISTORY: Past Medical History:  Diagnosis Date  . Anxiety   . Asthma   . Bronchitis   . Chronic diastolic CHF (congestive heart failure) ([place])   . Chronic diastolic CHF (congestive heart failure) ([place]) 07/17/2016  . Coronary artery disease    a. NSTEMI 12/2015 - LHC 01/18/16: s/p overlapping DESx2 to RCA, 10% ost-prox Cx,10% mLAD.  Marland Kitchen Depression   . Diabetes mellitus   . Dyslipidemia   . GERD (gastroesophageal reflux disease)   . Heart attack ([place])   . Hypertension   . Hypertensive heart disease   . NSTEMI (non-ST elevated myocardial infarction) ([place]) 12/2015  . Pneumonia     PAST SURGICAL HISTORY: Past Surgical History:  Procedure  Laterality Date  . ABDOMINAL HYSTERECTOMY    . CARDIAC CATHETERIZATION  1994   minimal LAD dz, no other dz, EF normal  . CARDIAC CATHETERIZATION N/A 01/18/2016   Procedure: Left Heart Cath and Coronary Angiography;  Surgeon: Burnell Blanks, MD;  Location: [facility];  Service: Cardiovascular;  Laterality: N/A;  . CARDIAC CATHETERIZATION N/A 01/18/2016   Procedure: Coronary Stent Intervention;  Surgeon: Burnell Blanks, MD;  Location: [facility];  Service: Cardiovascular;  Laterality: N/A;  . COLONOSCOPY WITH PROPOFOL N/A 04/09/2014   Procedure: COLONOSCOPY WITH PROPOFOL;  Surgeon: Cleotis Nipper, MD;  Location: [facility];  Service: Endoscopy;  Laterality: N/A;  . CORONARY STENT PLACEMENT  01/19/2016   STENT SYNERGY DES UK:[id number] drug eluting stent was successfully placed, and overlaps the 2.5 x 38 mm Synergy stent placed distally.  . ESOPHAGOGASTRODUODENOSCOPY (EGD) WITH PROPOFOL N/A 04/09/2014   Procedure: ESOPHAGOGASTRODUODENOSCOPY (EGD)  WITH PROPOFOL;  Surgeon: Cleotis Nipper, MD;  Location: [facility];  Service: Endoscopy;  Laterality: N/A;  . HEMORRHOID SURGERY    . HERNIA REPAIR    . KNEE ARTHROSCOPY      FAMILY HISTORY: Family History  Problem Relation Age of Onset  . Allergies Mother   . Asthma Mother   . CAD Father 65  . Diabetes Father   . Hypertension Father   . Congestive Heart Failure Sister        died in her 76s  . CAD Sister 71    SOCIAL HISTORY: Social History   Socioeconomic History  . Marital status: Married    Spouse name: Not on file  . Number of children: 3  . Years of education: some college  . Highest education level: Not on file  Occupational History  . Occupation: Education officer, community  . Financial resource strain: Not on file  . Food insecurity    Worry: Not on file    Inability: Not on file  . Transportation needs    Medical: Not on file    Non-medical: Not on file  Tobacco Use  . Smoking status:  Former Smoker    Packs/day: 1.00    Years: 30.00    Pack years: 30.00    Types: Cigarettes    Quit date: 01/09/2004    Years since quitting: 15.2  . Smokeless tobacco: Never Used  Substance and Sexual Activity  . Alcohol use: No    Alcohol/week: 0.0 standard drinks  . Drug use: No  . Sexual activity: Not on file  Lifestyle  . Physical activity    Days per week: Not on file    Minutes per session: Not on file  . Stress: Not on file  Relationships  . Social Herbalist on phone: Not on file    Gets together: Not on file    Attends religious service: Not on file    Active member of club or organization: Not on file    Attends meetings of clubs or organizations: Not on file    Relationship status: Not on file  . Intimate partner violence    Fear of current or ex partner: Not on file    Emotionally abused: Not on file    Physically abused: Not on file    Forced sexual activity: Not on file  Other Topics Concern  . Not on file  Social History Narrative   Pt lives with husband in [place].   Right-handed.   2 cups caffeine per day.     PHYSICAL EXAM  Vitals:   03/20/19 0807  BP: (!) 143/84  Pulse: 79  Temp: (!) 96.8 F (36 C)  Weight: 154 lb 3.2 oz (69.9 kg)  Height: 5\' 2"  (1.575 m)   Body mass index is 28.2 kg/m.  Generalized: Well developed, in no acute distress  Head: normocephalic and atraumatic,. Oropharynx benign  Neck: Supple,   Musculoskeletal: No deformity   Neurological examination   Mentation: Alert oriented to time, place, history taking. Attention span and concentration appropriate. Recent and remote memory intact.  Follows all commands speech and language fluent.   Cranial nerve II-XII: Pupils were equal round reactive to light extraocular movements were full, visual field were full on confrontational test. Facial sensation and strength were normal. hearing was intact to finger rubbing bilaterally. Uvula tongue midline. head turning and  shoulder shrug were normal and symmetric.Tongue protrusion into cheek strength was normal. Motor:  normal bulk and tone, full strength in the BUE, BLE, fine finger movements normal, no pronator drift.  Sensory: normal and symmetric to light touch, on the face arms and legs Coordination: finger-nose-finger, heel-to-shin bilaterally, no dysmetria Reflexes: Brachioradialis 2/2, biceps 2/2, triceps 2/2, patellar 2/2, Achilles 2/2, plantar responses were flexor bilaterally. Gait and Station: Rising up from seated position without assistance, normal stance,  moderate stride, good arm swing, smooth turning,  DIAGNOSTIC DATA (LABS, IMAGING, TESTING) - I reviewed patient records, labs, notes, testing and imaging myself where available.  Lab Results  Component Value Date   WBC 8.9 12/02/2018   HGB 11.2 (L) 12/02/2018   HCT 35.1 (L) 12/02/2018   MCV 96.7 12/02/2018   PLT 300 12/02/2018      Component Value Date/Time   NA 140 12/02/2018 2100   NA 142 02/22/2018 1103   K 4.3 12/02/2018 2100   CL 103 12/02/2018 2100   CO2 26 12/02/2018 2100   GLUCOSE 113 (H) 12/02/2018 2100   BUN 25 (H) 12/02/2018 2100   BUN 15 02/22/2018 1103   CREATININE 1.14 (H) 12/02/2018 2100   CREATININE 1.06 (H) 01/26/2016 1608   CALCIUM 9.2 12/02/2018 2100   PROT 6.9 12/02/2018 2225   PROT 7.6 05/17/2017 1141   ALBUMIN 3.6 12/02/2018 2225   ALBUMIN 4.3 05/17/2017 1141   AST 15 12/02/2018 2225   ALT 11 12/02/2018 2225   ALKPHOS 53 12/02/2018 2225   BILITOT 0.3 12/02/2018 2225   BILITOT 0.4 05/17/2017 1141   GFRNONAA 49 (L) 12/02/2018 2100   GFRAA 57 (L) 12/02/2018 2100   Lab Results  Component Value Date   CHOL 196 07/18/2016   HDL 48 07/18/2016   LDLCALC 107 (H) 07/18/2016   TRIG 203 (H) 07/18/2016   CHOLHDL 4.1 07/18/2016   Lab Results  Component Value Date   HGBA1C 7.3 (H) 10/06/2017       ASSESSMENT AND PLAN  Epilepsy  Doing well on Depakote ER 500 mg 2 tablets at night Memory loss  MRI of  the brain without contrast  Laboratory evaluation for treatable etiology   Marcial Pacas, M.D. Ph.D.  [facility] [place], [place] [address] Phone: [phone] Fax:      [phone]

## 2019-03-21 LAB — RPR: RPR Ser Ql: NONREACTIVE

## 2019-03-21 LAB — VALPROIC ACID LEVEL: Valproic Acid Lvl: 80 ug/mL (ref 50–100)

## 2019-03-21 LAB — VITAMIN B12: Vitamin B-12: 901 pg/mL (ref 232–1245)

## 2019-03-21 LAB — TSH: TSH: 0.994 u[IU]/mL (ref 0.450–4.500)

## 2019-03-23 ENCOUNTER — Telehealth: Payer: Self-pay

## 2019-03-23 NOTE — Telephone Encounter (Signed)
Order sent to GI to schedule. Patient can call them at [phone] for appointment status.  

## 2019-03-25 ENCOUNTER — Telehealth: Payer: Self-pay | Admitting: *Deleted

## 2019-03-25 NOTE — Telephone Encounter (Signed)
-----   Message from Penni Bombard, MD sent at 03/24/2019  6:13 PM EST ----- Unremarkable labs. Continue current plan. Please call patient. -VRP

## 2019-03-25 NOTE — Telephone Encounter (Signed)
Spoke to the patient who verbalized understanding of her results.  She will continue her medication as prescribed and keep her pending follow up appt.

## 2019-03-26 NOTE — Progress Notes (Signed)
Subjective:  [name] presents to clinic today with cc of  painful, thick, discolored, elongated toenails  of both feet that become tender and patient cannot cut because of thickness. Pain is aggravated when wearing enclosed shoe gear and relieved with periodic professional debridement.  She states her feet are still peeling. She states her husband may also have athlete's feet. She has been cleaning her shower, but not with a bleach based cleanser.   Medications reviewed in chart.  No Known Allergies   Objective:  Physical Examination:  Neurovascular status intact and unchanged b/l.  Dermatological Examination: Skin with normal turgor, texture and tone b/l.  No open wounds b/l.  No interdigital macerations noted b/l.  Elongated, thick, discolored brittle toenails with subungual debris and pain on dorsal palpation of nailbeds 1-5 b/l.  Diffuse scaling noted peripherally and plantarly b/l feet with mild foot odor.  No interdigital macerations.  No blisters, no weeping. No signs of secondary bacterial infection noted.  Musculoskeletal Examination: Muscle strength 5/5 to all muscle groups b/l.  No pain, crepitus or joint discomfort with active/passive ROM.  Neurological Examination: Sensation intact 5/5 b/l with 10 gram monofilament.  Vibratory sensation intact b/l.  Proprioceptive sensation intact b/l.  Assessment: Mycotic nail infection with pain 1-5 b/l Tinea pedis b/l NIDDM  Plan: 1. Toenails 1-5 b/l were debrided in length and girth without iatrogenic laceration. 2. Instructions: To prevent re-infection of athlete's feet, clean bathtub or shower with bleach based cleanser. Wear mask when cleaning tub/shower. Ventilate bathroom after cleaning tub/shower due to your asthma. 3. Continue Lotrisone Cream to feet twice daily for additional 4 weeks. 4. Continue soft, supportive shoe gear daily. 5. Report any pedal injuries to medical professional. 6. Follow up 3  months. 7. Patient/POA to call should there be a question/concern in there interim.

## 2019-04-08 ENCOUNTER — Telehealth: Payer: Self-pay | Admitting: Internal Medicine

## 2019-04-08 MED ORDER — ALBUTEROL SULFATE HFA 108 (90 BASE) MCG/ACT IN AERS: 2.0000 | INHALATION_SPRAY | RESPIRATORY_TRACT | 5 refills | Status: DC | PRN

## 2019-04-08 NOTE — Telephone Encounter (Signed)
Pt requesting refill on albuterol inhaler.  This has been sent to pharmacy as requested.  Nothing further needed at this time- will close encounter.

## 2019-04-09 ENCOUNTER — Telehealth: Payer: Self-pay | Admitting: Cardiology

## 2019-04-09 NOTE — Telephone Encounter (Signed)
Pt c/o BP issue: STAT if pt c/o blurred vision, one-sided weakness or slurred speech  1. What are your last 5 BP readings?  04/09/19 146/67 04/08/19 137/78 04/07/19 181/86 166/78 04/06/19  166/83 04/05/19 167/77 04/04/19 154/75 2. Are you having any other symptoms (ex. Dizziness, headache, blurred vision, passed out)? No  3. What is your BP issue? Anderson Malta from [employer] is calling stating the patient is concered with BP readings. Anderson Malta is wanting to know if any changes need to be made or if they should continue monitoring it before advised further. She states you can contact her if you would like her to keep monitoring, but would prefer you call patient if any changes need to be made at [phone].

## 2019-04-10 MED ORDER — NEBIVOLOL HCL 10 MG PO TABS: 10.0000 mg | ORAL_TABLET | Freq: Every day | ORAL | 11 refills | Status: DC

## 2019-04-10 NOTE — Telephone Encounter (Signed)
Pt is agreeable to increasing Bystolic.  She will continue to monitor her BP.  She is asking about whether she should be tested for Covid b/c her grand-daughter's (who lives with her) 2 friends who are frequently at her house, tested positive yesterday. Pt denies any s/s of Covid at this time.  Her granddaughter has no s/s either.  Pt has called her PCP to discuss with him if and when she should be checked.  Advised to continue to monitor for s/s and discuss with PCP when they call back.  Pt is in agreement.

## 2019-04-10 NOTE — Telephone Encounter (Signed)
Let's increase Bystolic to 10mg  PO QD.  Candee Furbish, MD

## 2019-04-13 ENCOUNTER — Other Ambulatory Visit: Payer: Medicare Other

## 2019-05-05 ENCOUNTER — Ambulatory Visit
Admission: RE | Admit: 2019-05-05 | Discharge: 2019-05-05 | Disposition: A | Discharge status: Home / Self Care | Payer: Medicare Other | Source: Ambulatory Visit | Attending: Neurology | Admitting: Neurology

## 2019-05-05 ENCOUNTER — Other Ambulatory Visit: Payer: Self-pay

## 2019-05-05 DIAGNOSIS — R413 Other amnesia: Secondary | ICD-10-CM

## 2019-05-06 ENCOUNTER — Telehealth: Payer: Self-pay | Admitting: Neurology

## 2019-05-06 NOTE — Telephone Encounter (Signed)
IMPRESSION:   This MRI of the brain without contrast shows the following:  1.   Generalized cortical atrophy most pronounced in the superficial parietal lobes.  The atrophy has slightly progressed compared to the previous MRI. 2.   T2/flair hyperintense changes in the anterior temporal lobes bilaterally.  Additionally there is mild hippocampal asymmetry, smaller on the right.  These findings are stable compared to the 2017 MRI.  This could be due to chronic changes from prior trauma or ischemia or mesial temporal sclerosis. 3.   Chronic microvascular ischemic changes. 4.   Reduced pituitary height.  This could be an incidental finding or due to elevated intracranial pressures.  It appears stable compared to the previous MRI.  Please call patient, MRI of the brain showed generalized atrophy, supratentorium small vessel disease, there was no acute abnormalities

## 2019-05-06 NOTE — Telephone Encounter (Signed)
I spoke to the patient and provided her with the results below. She verbalized understanding. 

## 2019-05-09 ENCOUNTER — Other Ambulatory Visit: Payer: Medicare Other

## 2019-05-19 ENCOUNTER — Ambulatory Visit: Payer: Medicare Other | Admitting: Internal Medicine

## 2019-05-23 ENCOUNTER — Ambulatory Visit: Payer: Medicare Other | Admitting: Neurology

## 2019-05-29 ENCOUNTER — Other Ambulatory Visit: Payer: Self-pay | Admitting: *Deleted

## 2019-05-29 MED ORDER — DIVALPROEX SODIUM ER 500 MG PO TB24: 1000.0000 mg | ORAL_TABLET | Freq: Every day | ORAL | 0 refills | Status: DC

## 2019-06-05 ENCOUNTER — Other Ambulatory Visit: Payer: Self-pay

## 2019-06-05 ENCOUNTER — Ambulatory Visit: Payer: Medicare Other | Admitting: Neurology

## 2019-06-05 ENCOUNTER — Encounter: Payer: Self-pay | Admitting: Neurology

## 2019-06-05 VITALS — BP 123/75 | HR 78 | Temp 97.5°F | Ht 62.0 in | Wt 151.2 lb

## 2019-06-05 DIAGNOSIS — R413 Other amnesia: Secondary | ICD-10-CM | POA: Diagnosis not present

## 2019-06-05 DIAGNOSIS — R569 Unspecified convulsions: Secondary | ICD-10-CM | POA: Diagnosis not present

## 2019-06-05 MED ORDER — DIVALPROEX SODIUM ER 500 MG PO TB24: 1000.0000 mg | ORAL_TABLET | Freq: Every day | ORAL | 3 refills | Status: DC

## 2019-06-05 MED ORDER — MEMANTINE HCL 5 MG PO TABS: ORAL_TABLET | ORAL | 0 refills | Status: DC

## 2019-06-05 NOTE — Progress Notes (Signed)
PATIENT: VENICIA BERGAN DOB: 1951/02/07  REASON FOR VISIT: follow up HISTORY FROM: patient  HISTORY OF PRESENT ILLNESS: Today 06/05/19  HISTORY HISTORY OF PRESENT ILLNESS:YY DASHONDA PIASCIK a 69 years old left-handed female, seen in refer by her primary care doctor [name] for evaluation of episodes of confusion on January 27 2016, I reviewed and summarized the referring note, she had a history of type 2 diabetes, hypertension, hyperlipidemia, pulmonary emboli and, depression, COPD, coronary artery disease, stent placement following a heart attack in January 18 2016, I reviewed echo report on January 18 2016, ejection fraction 65-70%, severe concentric hypertrophy, septal wall thickness was increased, with mild hypertrophy of the posterior wall, abnormal relaxation, increased filling pressure, I also reviewed laboratory evaluation BMP showed elevated glucose 270, creatinine 1.06, cholesterol 207, LDL 110, A1c 7.0, She had her first generalized seizure in 2011, was taken to [place] evaluated by neurologist there, per patient, there was a suggestion of antiepileptic medications, however it was never followed through.  Over the years, she had no generalized seizure, but every few weeks to every few months, she would have episodes of sudden onset confusion, space out, lasting less than 5 minutes  She went through extreme stress (her grandson died)in 2015/05/28, since then, she began to have increased spells, almost daily basis now, she noted when she was reading, cooking, has transient time elapsed, she came to confused, sometimes preceded by nausea dry mouth sensation. She denies family history of seizure, she works as a Midwife at [employer].  UPDATE May 11 2016:YY She is now taking Depakote ER 500 mg every night, which has helped her tremendously, she no longer has recurrent spells, EEG was normal in October 2017 We have personally  reviewed MRI of the brain in November 2017, multiple periventricular and subcortical punctate scattered chronic small vessel disease, increased T2 flare hyperintensity in the mesial temporal lobe, right hippocampus is slightly smaller than the left,  Update March 20, 2019: She had no recurrent seizure, last seizure was in 2016, tolerating Depakote ER 500 mg 2 tablets at nighttime,  Today her main concern is intermittent memory loss, she retired as a Chief Financial Officer recently, she was noted to have difficulty for the store layout since 2019, she denied a family history of dementia.   Update June 05, 2019 SS: MRI of the brain showed generalized atrophy, supratentorium small vessel disease, no acute abnormality.  Laboratory evaluation was unremarkable (RPR, B12, TSH).  Depakote level was 80 in November 2020.  She continues to complain of mild memory loss, reports notices it most when she is driving, she may get lost going places that she should know how to go.  She has not had any accident.  If she does not remember directions, she will use her phone for navigation.  She lives with her husband, manages all of her own ADLs and housework.  She misses the social aspect of her job, she works Scientist, research (medical) for many years, retired in July 2020, due to pulmonary issues.  She pays her bills, but says her daughter helps remind her appointments.  She says her mother and father had dementia.  Her recent A1c was 6.3.  She has not had recurrent seizure.  She presents today for evaluation unaccompanied.  REVIEW OF SYSTEMS: Out of a complete 14 system review of symptoms, the patient complains only of the following symptoms, and all other reviewed systems are negative.  Seizures, memory loss  ALLERGIES:  No Known Allergies  HOME MEDICATIONS: Outpatient Medications Prior to Visit  Medication Sig Dispense Refill  . acetaminophen-codeine (TYLENOL #3) 300-30 MG tablet TAKE 1 TABLET BY MOUTH EVERY 6 TO 8  HOURS AS NEEDED FOR PAIN    . albuterol (PROAIR HFA) 108 (90 Base) MCG/ACT inhaler Inhale 2 puffs into the lungs every 4 (four) hours as needed for wheezing or shortness of breath. 18 g 5  . albuterol (PROVENTIL) (2.5 MG/3ML) 0.083% nebulizer solution Take 2.5 mg by nebulization every 4 (four) hours as needed for wheezing or shortness of breath (((PLAN C))).    Marland Kitchen ALPRAZolam (XANAX) 0.5 MG tablet Take 1 tablet (0.5 mg total) by mouth 2 (two) times daily as needed for anxiety. 30 tablet 0  . aspirin EC 81 MG tablet Take 81 mg by mouth daily.     Marland Kitchen atorvastatin (LIPITOR) 40 MG tablet Take 40 mg by mouth at bedtime.    . Biotin 5000 MCG CAPS Take 1 capsule by mouth daily.    . budesonide (PULMICORT) 0.5 MG/2ML nebulizer solution Take 2 mLs (0.5 mg total) by nebulization daily. 60 mL 5  . chlorpheniramine (CHLOR-TRIMETON) 4 MG tablet Take 4 mg by mouth every 4 (four) hours as needed for allergies.    Marland Kitchen dextromethorphan (DELSYM) 30 MG/5ML liquid 2 tsp every 12 hours as needed w/ flutter    . diltiazem (CARTIA XT) 240 MG 24 hr capsule Take 1 capsule (240 mg total) by mouth daily. 90 capsule 3  . divalproex (DEPAKOTE ER) 500 MG 24 hr tablet Take 2 tablets (1,000 mg total) by mouth at bedtime. 60 tablet 0  . esomeprazole (NEXIUM) 20 MG capsule Take 20 mg by mouth every morning.     . formoterol (PERFOROMIST) 20 MCG/2ML nebulizer solution Take 2 mLs (20 mcg total) by nebulization every morning. 60 mL 3  . hydrALAZINE (APRESOLINE) 25 MG tablet TAKE 1 TABLET BY MOUTH TWICE DAILY 180 tablet 3  . metFORMIN (GLUCOPHAGE) 500 MG tablet Take 500 mg by mouth 2 (two) times daily with a meal.    . nebivolol (BYSTOLIC) 10 MG tablet Take 1 tablet (10 mg total) by mouth daily. 34 tablet 11  . nitroGLYCERIN (NITROSTAT) 0.4 MG SL tablet DISSOLVE ONE TABLET UNDER THE TONGUE EVERY 5 MINUTES AS NEEDED FOR CHEST PAIN.  DO NOT EXCEED A TOTAL OF 3 DOSES IN 15 MINUTES 25 tablet 0  . potassium chloride SA (K-DUR,KLOR-CON) 20 MEQ  tablet Take 20 mEq by mouth daily.     . predniSONE (DELTASONE) 10 MG tablet 4 x 2 days, 2 x 2 days, 1 x 2 days then stop as needed    . torsemide (DEMADEX) 20 MG tablet Take 20-60 mg by mouth daily. Take 1 tablet daily, can take another one or two as needed for swelling     No facility-administered medications prior to visit.    PAST MEDICAL HISTORY: Past Medical History:  Diagnosis Date  . Anxiety   . Asthma   . Bronchitis   . Chronic diastolic CHF (congestive heart failure) ([place])   . Chronic diastolic CHF (congestive heart failure) ([place]) 07/17/2016  . Coronary artery disease    a. NSTEMI 12/2015 - LHC 01/18/16: s/p overlapping DESx2 to RCA, 10% ost-prox Cx,10% mLAD.  Marland Kitchen Depression   . Diabetes mellitus   . Dyslipidemia   . GERD (gastroesophageal reflux disease)   . Heart attack ([place])   . Hypertension   . Hypertensive heart disease   . NSTEMI (non-ST elevated  myocardial infarction) ([place]) 12/2015  . Pneumonia     PAST SURGICAL HISTORY: Past Surgical History:  Procedure Laterality Date  . ABDOMINAL HYSTERECTOMY    . CARDIAC CATHETERIZATION  1994   minimal LAD dz, no other dz, EF normal  . CARDIAC CATHETERIZATION N/A 01/18/2016   Procedure: Left Heart Cath and Coronary Angiography;  Surgeon: Burnell Blanks, MD;  Location: [facility];  Service: Cardiovascular;  Laterality: N/A;  . CARDIAC CATHETERIZATION N/A 01/18/2016   Procedure: Coronary Stent Intervention;  Surgeon: Burnell Blanks, MD;  Location: [facility];  Service: Cardiovascular;  Laterality: N/A;  . COLONOSCOPY WITH PROPOFOL N/A 04/09/2014   Procedure: COLONOSCOPY WITH PROPOFOL;  Surgeon: Cleotis Nipper, MD;  Location: [facility];  Service: Endoscopy;  Laterality: N/A;  . CORONARY STENT PLACEMENT  01/19/2016   STENT SYNERGY DES UK:[id number] drug eluting stent was successfully placed, and overlaps the 2.5 x 38 mm Synergy stent placed distally.  . ESOPHAGOGASTRODUODENOSCOPY (EGD) WITH PROPOFOL  N/A 04/09/2014   Procedure: ESOPHAGOGASTRODUODENOSCOPY (EGD) WITH PROPOFOL;  Surgeon: Cleotis Nipper, MD;  Location: [facility];  Service: Endoscopy;  Laterality: N/A;  . HEMORRHOID SURGERY    . HERNIA REPAIR    . KNEE ARTHROSCOPY      FAMILY HISTORY: Family History  Problem Relation Age of Onset  . Allergies Mother   . Asthma Mother   . CAD Father 83  . Diabetes Father   . Hypertension Father   . Congestive Heart Failure Sister        died in her 66s  . CAD Sister 20    SOCIAL HISTORY: Social History   Socioeconomic History  . Marital status: Married    Spouse name: Not on file  . Number of children: 3  . Years of education: some college  . Highest education level: Not on file  Occupational History  . Occupation: Nutritional therapist  Tobacco Use  . Smoking status: Former Smoker    Packs/day: 1.00    Years: 30.00    Pack years: 30.00    Types: Cigarettes    Quit date: 01/09/2004    Years since quitting: 15.4  . Smokeless tobacco: Never Used  Substance and Sexual Activity  . Alcohol use: No    Alcohol/week: 0.0 standard drinks  . Drug use: No  . Sexual activity: Not on file  Other Topics Concern  . Not on file  Social History Narrative   Pt lives with husband in [place].   Right-handed.   2 cups caffeine per day.   Social Determinants of Health   Financial Resource Strain:   . Difficulty of Paying Living Expenses: Not on file  Food Insecurity:   . Worried About Charity fundraiser in the Last Year: Not on file  . Ran Out of Food in the Last Year: Not on file  Transportation Needs:   . Lack of Transportation (Medical): Not on file  . Lack of Transportation (Non-Medical): Not on file  Physical Activity:   . Days of Exercise per Week: Not on file  . Minutes of Exercise per Session: Not on file  Stress:   . Feeling of Stress : Not on file  Social Connections:   . Frequency of Communication with Friends and Family: Not on file  . Frequency of Social  Gatherings with Friends and Family: Not on file  . Attends Religious Services: Not on file  . Active Member of Clubs or Organizations: Not on file  . Attends  Club or Organization Meetings: Not on file  . Marital Status: Not on file  Intimate Partner Violence:   . Fear of Current or Ex-Partner: Not on file  . Emotionally Abused: Not on file  . Physically Abused: Not on file  . Sexually Abused: Not on file   PHYSICAL EXAM  Vitals:   06/05/19 1516  BP: 123/75  Pulse: 78  Temp: (!) 97.5 F (36.4 C)  TempSrc: Oral  Weight: 151 lb 3.2 oz (68.6 kg)  Height: 5\' 2"  (1.575 m)   Body mass index is 27.65 kg/m.  Generalized: Well developed, in no acute distress  MMSE - Mini Mental State Exam 06/05/2019 03/20/2019  Not completed: (No Data) -  Orientation to time 5 4  Orientation to Place 4 5  Registration 3 3  Attention/ Calculation 1 1  Recall 1 2  Language- name 2 objects 2 2  Language- repeat 1 1  Language- follow 3 step command 2 3  Language- follow 3 step command-comments she placed the paper in her left hand after she folded it -  Language- read & follow direction 1 1  Write a sentence 1 1  Copy design 1 1  Total score 22 24    Neurological examination  Mentation: Alert oriented to time, place, history taking. Follows all commands speech and language fluent Cranial nerve II-XII: Pupils were equal round reactive to light. Extraocular movements were full, visual field were full on confrontational test. Facial sensation and strength were normal. Head turning and shoulder shrug  were normal and symmetric. Motor: Good strength of all extremities, mild weakness of left hip flexion due to pain Sensory: Sensory testing is intact to soft touch on all 4 extremities. No evidence of extinction is noted.  Coordination: Cerebellar testing reveals good finger-nose-finger and heel-to-shin bilaterally.  Gait and station: Able to stand from seated position without pushoff, gait is slightly  wide-based, but steady. Reflexes: Deep tendon reflexes are symmetric and normal bilaterally.   DIAGNOSTIC DATA (LABS, IMAGING, TESTING) - I reviewed patient records, labs, notes, testing and imaging myself where available.  Lab Results  Component Value Date   WBC 8.9 12/02/2018   HGB 11.2 (L) 12/02/2018   HCT 35.1 (L) 12/02/2018   MCV 96.7 12/02/2018   PLT 300 12/02/2018      Component Value Date/Time   NA 140 12/02/2018 2100   NA 142 02/22/2018 1103   K 4.3 12/02/2018 2100   CL 103 12/02/2018 2100   CO2 26 12/02/2018 2100   GLUCOSE 113 (H) 12/02/2018 2100   BUN 25 (H) 12/02/2018 2100   BUN 15 02/22/2018 1103   CREATININE 1.14 (H) 12/02/2018 2100   CREATININE 1.06 (H) 01/26/2016 1608   CALCIUM 9.2 12/02/2018 2100   PROT 6.9 12/02/2018 2225   PROT 7.6 05/17/2017 1141   ALBUMIN 3.6 12/02/2018 2225   ALBUMIN 4.3 05/17/2017 1141   AST 15 12/02/2018 2225   ALT 11 12/02/2018 2225   ALKPHOS 53 12/02/2018 2225   BILITOT 0.3 12/02/2018 2225   BILITOT 0.4 05/17/2017 1141   GFRNONAA 49 (L) 12/02/2018 2100   GFRAA 57 (L) 12/02/2018 2100   Lab Results  Component Value Date   CHOL 196 07/18/2016   HDL 48 07/18/2016   LDLCALC 107 (H) 07/18/2016   TRIG 203 (H) 07/18/2016   CHOLHDL 4.1 07/18/2016   Lab Results  Component Value Date   HGBA1C 7.3 (H) 10/06/2017   Lab Results  Component Value Date   VITAMINB12 901 03/20/2019  Lab Results  Component Value Date   TSH 0.994 03/20/2019   ASSESSMENT AND PLAN 69 y.o. year old female  has a past medical history of Anxiety, Asthma, Bronchitis, Chronic diastolic CHF (congestive heart failure) ([place]), Chronic diastolic CHF (congestive heart failure) ([place]) (07/17/2016), Coronary artery disease, Depression, Diabetes mellitus, Dyslipidemia, GERD (gastroesophageal reflux disease), Heart attack ([place]), Hypertension, Hypertensive heart disease, NSTEMI (non-ST elevated myocardial infarction) ([place]) (12/2015), and Pneumonia. here with:  1.  Epilepsy -Last seizure was 2016, doing well on Depakote -In November 2020, Depakote level was 80 -Continue Depakote 500 mg ER, 2 tablets at bedtime -Call for recurrent seizure  2.  Memory loss -MMSE was 22/30 -Laboratory evaluation did not show reversible cause, TSH, B12, RPR were normal November 2020 -MRI of the brain showed generalized atrophy, supratentorium small vessel disease, no acute abnormality -Does report family history of memory loss in her mother and father -We will give a trial of Namenda, I have given prescription for Namenda 5 mg titration pack, once completed titration, will call for maintenance prescription of 10 mg twice daily -Use caution with driving, has reported difficulty with directions -Follow-up in 6 months or sooner if needed   I spent 25 minutes with the patient. 50% of this time was spent discussing her plan of care.   Butler Denmark, AGNP-C, DNP 06/05/2019, 3:36 PM [facility] [address], [place], [place] [address] [phone]

## 2019-06-05 NOTE — Patient Instructions (Addendum)
1. Start Namenda for your memory, once you complete the titration, call me and I will send in a new RX for the maintenance dose   Take 1 tablet daily for one week, then take 1 tablet twice daily for one week, then take 1 tablet in the morning and 2 in the evening for one week, then take 2 tablets twice daily  2. Continue taking Depakote   3. Call for recurrent seizure   Memantine Tablets What is this medicine? MEMANTINE (MEM an teen) is used to treat dementia caused by Alzheimer's disease. This medicine may be used for other purposes; ask your health care provider or pharmacist if you have questions. COMMON BRAND NAME(S): Namenda What should I tell my health care provider before I take this medicine? They need to know if you have any of these conditions:  difficulty passing urine  kidney disease  liver disease  seizures  an unusual or allergic reaction to memantine, other medicines, foods, dyes, or preservatives  pregnant or trying to get pregnant  breast-feeding How should I use this medicine? Take this medicine by mouth with a glass of water. Follow the directions on the prescription label. You may take this medicine with or without food. Take your doses at regular intervals. Do not take your medicine more often than directed. Continue to take your medicine even if you feel better. Do not stop taking except on the advice of your doctor or health care professional. Talk to your pediatrician regarding the use of this medicine in children. Special care may be needed. Overdosage: If you think you have taken too much of this medicine contact a poison control center or emergency room at once. NOTE: This medicine is only for you. Do not share this medicine with others. What if I miss a dose? If you miss a dose, take it as soon as you can. If it is almost time for your next dose, take only that dose. Do not take double or extra doses. If you do not take your medicine for several days,  contact your health care provider. Your dose may need to be changed. What may interact with this medicine?  acetazolamide  amantadine  cimetidine  dextromethorphan  dofetilide  hydrochlorothiazide  ketamine  metformin  methazolamide  quinidine  ranitidine  sodium bicarbonate  triamterene This list may not describe all possible interactions. Give your health care provider a list of all the medicines, herbs, non-prescription drugs, or dietary supplements you use. Also tell them if you smoke, drink alcohol, or use illegal drugs. Some items may interact with your medicine. What should I watch for while using this medicine? Visit your doctor or health care professional for regular checks on your progress. Check with your doctor or health care professional if there is no improvement in your symptoms or if they get worse. You may get drowsy or dizzy. Do not drive, use machinery, or do anything that needs mental alertness until you know how this drug affects you. Do not stand or sit up quickly, especially if you are an older patient. This reduces the risk of dizzy or fainting spells. Alcohol can make you more drowsy and dizzy. Avoid alcoholic drinks. What side effects may I notice from receiving this medicine? Side effects that you should report to your doctor or health care professional as soon as possible:  allergic reactions like skin rash, itching or hives, swelling of the face, lips, or tongue  agitation or a feeling of restlessness  depressed mood  dizziness  hallucinations  redness, blistering, peeling or loosening of the skin, including inside the mouth  seizures  vomiting Side effects that usually do not require medical attention (report to your doctor or health care professional if they continue or are bothersome):  constipation  diarrhea  headache  nausea  trouble sleeping This list may not describe all possible side effects. Call your doctor for  medical advice about side effects. You may report side effects to FDA at 1-800-FDA-1088. Where should I keep my medicine? Keep out of the reach of children. Store at room temperature between 15 degrees and 30 degrees C (59 degrees and 86 degrees F). Throw away any unused medicine after the expiration date. NOTE: This sheet is a summary. It may not cover all possible information. If you have questions about this medicine, talk to your doctor, pharmacist, or health care provider.  2020 Elsevier/Gold Standard (2013-02-03 14:10:42)

## 2019-06-20 ENCOUNTER — Encounter: Payer: Self-pay | Admitting: Podiatry

## 2019-06-20 ENCOUNTER — Other Ambulatory Visit: Payer: Self-pay

## 2019-06-20 ENCOUNTER — Ambulatory Visit: Payer: Medicare Other | Admitting: Podiatry

## 2019-06-20 DIAGNOSIS — B351 Tinea unguium: Secondary | ICD-10-CM

## 2019-06-20 DIAGNOSIS — M79675 Pain in left toe(s): Secondary | ICD-10-CM

## 2019-06-20 DIAGNOSIS — E118 Type 2 diabetes mellitus with unspecified complications: Secondary | ICD-10-CM

## 2019-06-20 DIAGNOSIS — E119 Type 2 diabetes mellitus without complications: Secondary | ICD-10-CM

## 2019-06-20 DIAGNOSIS — M79674 Pain in right toe(s): Secondary | ICD-10-CM

## 2019-06-20 NOTE — Patient Instructions (Signed)
Diabetes Mellitus and Foot Care Foot care is an important part of your health, especially when you have diabetes. Diabetes may cause you to have problems because of poor blood flow (circulation) to your feet and legs, which can cause your skin to:  Become thinner and drier.  Break more easily.  Heal more slowly.  Peel and crack. You may also have nerve damage (neuropathy) in your legs and feet, causing decreased feeling in them. This means that you may not notice minor injuries to your feet that could lead to more serious problems. Noticing and addressing any potential problems early is the best way to prevent future foot problems. How to care for your feet Foot hygiene  Wash your feet daily with warm water and mild soap. Do not use hot water. Then, pat your feet and the areas between your toes until they are completely dry. Do not soak your feet as this can dry your skin.  Trim your toenails straight across. Do not dig under them or around the cuticle. File the edges of your nails with an emery board or nail file.  Apply a moisturizing lotion or petroleum jelly to the skin on your feet and to dry, brittle toenails. Use lotion that does not contain alcohol and is unscented. Do not apply lotion between your toes. Shoes and socks  Wear clean socks or stockings every day. Make sure they are not too tight. Do not wear knee-high stockings since they may decrease blood flow to your legs.  Wear shoes that fit properly and have enough cushioning. Always look in your shoes before you put them on to be sure there are no objects inside.  To break in new shoes, wear them for just a few hours a day. This prevents injuries on your feet. Wounds, scrapes, corns, and calluses  Check your feet daily for blisters, cuts, bruises, sores, and redness. If you cannot see the bottom of your feet, use a mirror or ask someone for help.  Do not cut corns or calluses or try to remove them with medicine.  If you  find a minor scrape, cut, or break in the skin on your feet, keep it and the skin around it clean and dry. You may clean these areas with mild soap and water. Do not clean the area with peroxide, alcohol, or iodine.  If you have a wound, scrape, corn, or callus on your foot, look at it several times a day to make sure it is healing and not infected. Check for: ? Redness, swelling, or pain. ? Fluid or blood. ? Warmth. ? Pus or a bad smell. General instructions  Do not cross your legs. This may decrease blood flow to your feet.  Do not use heating pads or hot water bottles on your feet. They may burn your skin. If you have lost feeling in your feet or legs, you may not know this is happening until it is too late.  Protect your feet from hot and cold by wearing shoes, such as at the beach or on hot pavement.  Schedule a complete foot exam at least once a year (annually) or more often if you have foot problems. If you have foot problems, report any cuts, sores, or bruises to your health care provider immediately. Contact a health care provider if:  You have a medical condition that increases your risk of infection and you have any cuts, sores, or bruises on your feet.  You have an injury that is not   healing.  You have redness on your legs or feet.  You feel burning or tingling in your legs or feet.  You have pain or cramps in your legs and feet.  Your legs or feet are numb.  Your feet always feel cold.  You have pain around a toenail. Get help right away if:  You have a wound, scrape, corn, or callus on your foot and: ? You have pain, swelling, or redness that gets worse. ? You have fluid or blood coming from the wound, scrape, corn, or callus. ? Your wound, scrape, corn, or callus feels warm to the touch. ? You have pus or a bad smell coming from the wound, scrape, corn, or callus. ? You have a fever. ? You have a red line going up your leg. Summary  Check your feet every day  for cuts, sores, red spots, swelling, and blisters.  Moisturize feet and legs daily.  Wear shoes that fit properly and have enough cushioning.  If you have foot problems, report any cuts, sores, or bruises to your health care provider immediately.  Schedule a complete foot exam at least once a year (annually) or more often if you have foot problems. This information is not intended to replace advice given to you by your health care provider. Make sure you discuss any questions you have with your health care provider. Document Revised: 01/08/2019 Document Reviewed: 05/19/2016 Elsevier Patient Education  2020 Elsevier Inc.  

## 2019-06-26 NOTE — Progress Notes (Signed)
Subjective: [name] presents today for follow up of preventative diabetic foot care and painful mycotic nails b/l that are difficult to trim. Pain interferes with ambulation. Aggravating factors include wearing enclosed shoe gear. Pain is relieved with periodic professional debridement.  She voices no new pedal problems on today's visit.  She states she has been cleaning her shower with bleach based cleanser.   No Known Allergies   Objective: There were no vitals filed for this visit.  Vascular Examination:  Capillary refill time to digits immediate b/l, palpable DP pulses b/l, palpable PT pulses b/l, pedal hair present b/l and skin temperature gradient within normal limits b/l  Dermatological Examination: Pedal skin with normal turgor, texture and tone bilaterally, no open wounds bilaterally, toenails 1-5 b/l elongated, dystrophic, thickened, crumbly with subungual debris and there is resolution of tinea pedis noted b/l.  Musculoskeletal: Normal muscle strength 5/5 to all lower extremity muscle groups bilaterally, no gross bony deformities bilaterally, no pain crepitus or joint limitation noted with ROM b/l and patient ambulates independent of any assistive aids  Neurological: Protective sensation intact 5/5 intact bilaterally with 10g monofilament b/l and vibratory sensation intact b/l.  Assessment: 1. Pain due to onychomycosis of toenails of both feet   2. Controlled type 2 diabetes mellitus without complication, without long-term current use of insulin ([place])    Plan: -Continue diabetic foot care principles. Literature dispensed on today.  -Toenails 1-5 b/l were debrided in length and girth with sterile nail nippers and dremel without iatrogenic bleeding. -Patient to continue soft, supportive shoe gear daily. -Patient to report any pedal injuries to medical professional immediately. -Patient/POA to call should there be question/concern in the interim.  Return in about 3  months (around 09/17/2019) for diabetic nail trim.

## 2019-07-02 NOTE — Progress Notes (Signed)
I have reviewed and agreed above plan. 

## 2019-07-03 ENCOUNTER — Ambulatory Visit: Payer: Medicare Other | Admitting: Neurology

## 2019-07-08 ENCOUNTER — Telehealth: Payer: Self-pay | Admitting: Neurology

## 2019-07-08 MED ORDER — MEMANTINE HCL 10 MG PO TABS: 10.0000 mg | ORAL_TABLET | Freq: Two times a day (BID) | ORAL | 11 refills | Status: DC

## 2019-07-08 NOTE — Telephone Encounter (Signed)
Pt called stating it is time for her refill on her memantine (NAMENDA) 5 MG tablet and she wanted to inform the provider that they medication is helping a bit, she is able to maneuver a bit better and is remembering things a little bit more. Please advise.

## 2019-07-08 NOTE — Telephone Encounter (Signed)
I renewed the memantine to 10mg  po bid.  I called pt and LMVM for her and family that tablet strength will be increased and she will take only 1 tablet 10mg  po bid.  She is to call back if questions.

## 2019-08-05 ENCOUNTER — Ambulatory Visit (INDEPENDENT_AMBULATORY_CARE_PROVIDER_SITE_OTHER): Payer: Medicare Other | Admitting: Cardiology

## 2019-08-05 ENCOUNTER — Encounter: Payer: Self-pay | Admitting: Cardiology

## 2019-08-05 ENCOUNTER — Other Ambulatory Visit: Payer: Self-pay

## 2019-08-05 VITALS — BP 116/62 | HR 72 | Ht 61.0 in | Wt 147.8 lb

## 2019-08-05 DIAGNOSIS — E782 Mixed hyperlipidemia: Secondary | ICD-10-CM

## 2019-08-05 DIAGNOSIS — I5032 Chronic diastolic (congestive) heart failure: Secondary | ICD-10-CM

## 2019-08-05 DIAGNOSIS — I2510 Atherosclerotic heart disease of native coronary artery without angina pectoris: Secondary | ICD-10-CM | POA: Diagnosis not present

## 2019-08-05 DIAGNOSIS — R432 Parageusia: Secondary | ICD-10-CM

## 2019-08-05 DIAGNOSIS — I10 Essential (primary) hypertension: Secondary | ICD-10-CM

## 2019-08-05 DIAGNOSIS — I214 Non-ST elevation (NSTEMI) myocardial infarction: Secondary | ICD-10-CM

## 2019-08-05 NOTE — Progress Notes (Signed)
Cardiology Office Note   Date:  08/05/2019   ID:  [name] Oct 28, 1950, MRN [id number]  PCP:  Jacqueline Frees, MD  Cardiologist:  Dr. Marlou [name]     Chief Complaint  Patient presents with  . Congestive Heart Failure      History of Present Illness: [name] is a 69 y.o. female who presents for HF and CAD.   Hx of NSTEMI 12/2015 and had Cath with RCA disease and DES X 2 to RCA   She has a hx of asthma being treated by Dr. Melvyn [name], FEV1 68% here for follow-up.  She has been in the emergency department last in August with atypical chest pain.  This was felt to be GERD related.  Delta troponin was normal.  She is also called in September for reports of leg swelling.  Previously was advised to increase torsemide to 3 tablets a day and dietary measures were explained.  Last visit was doing really well. Sharp pain in arm and side and back. Worried her. GERD. Took reflux tablet at 3am. Took Xanax as well. Sat down. And pain eased down. Likely MSK. She retired. Should not be around too many peoples.   ECHOCardiogram 08/13/2017:  - Left ventricle: The cavity size was normal. There was moderate concentric hypertrophy. Systolic function was vigorous. The estimated ejection fraction was in the range of 65% to 70%. Wall motion was normal; there were no regional wall motion abnormalities. Features are consistent with a pseudonormal left ventricular filling pattern, with concomitant abnormal relaxation and increased filling pressure (grade 2 diastolic dysfunction). Doppler parameters are consistent with high ventricular filling pressure. - Mitral valve: There was trivial regurgitation. - Pulmonary arteries: PA peak pressure: 34 mm Hg (S).    For HF she is on demadex and last visit wt was down.  HTn controlled HLD stable.  Today she is doing well.  No SOB, no asthma attacks. No chest pain. She is bored.  She is thinking of returning to work, but we discussed  she is better physically since she retired.  She would still be exposed to people though she has had both COVID vaccine injections.  She only wants to work 2 days a week for 4 hours.  I recommended Dr. Melvyn [name] should weigh in.  She lost weight - has lost her sense of taste.  She does not believe she had COVID.  But it may be a little better    The patient does not have symptoms concerning for COVID-19 infection (fever, chills, cough, or new shortness of breath).     Past Medical History:  Diagnosis Date  . Anxiety   . Asthma   . Bronchitis   . Chronic diastolic CHF (congestive heart failure) ([place])   . Chronic diastolic CHF (congestive heart failure) ([place]) 07/17/2016  . Coronary artery disease    a. NSTEMI 12/2015 - LHC 01/18/16: s/p overlapping DESx2 to RCA, 10% ost-prox Cx,10% mLAD.  Marland Kitchen Depression   . Diabetes mellitus   . Dyslipidemia   . GERD (gastroesophageal reflux disease)   . Heart attack ([place])   . Hypertension   . Hypertensive heart disease   . NSTEMI (non-ST elevated myocardial infarction) ([place]) 12/2015  . Pneumonia     Past Surgical History:  Procedure Laterality Date  . ABDOMINAL HYSTERECTOMY    . CARDIAC CATHETERIZATION  1994   minimal LAD dz, no other dz, EF normal  . CARDIAC CATHETERIZATION N/A 01/18/2016   Procedure: Left Heart Cath and  Coronary Angiography;  Surgeon: Burnell Blanks, MD;  Location: [facility];  Service: Cardiovascular;  Laterality: N/A;  . CARDIAC CATHETERIZATION N/A 01/18/2016   Procedure: Coronary Stent Intervention;  Surgeon: Burnell Blanks, MD;  Location: [facility];  Service: Cardiovascular;  Laterality: N/A;  . COLONOSCOPY WITH PROPOFOL N/A 04/09/2014   Procedure: COLONOSCOPY WITH PROPOFOL;  Surgeon: Cleotis Nipper, MD;  Location: [facility];  Service: Endoscopy;  Laterality: N/A;  . CORONARY STENT PLACEMENT  01/19/2016   STENT SYNERGY DES [id number] drug eluting stent was successfully placed, and overlaps the 2.5  x 38 mm Synergy stent placed distally.  . ESOPHAGOGASTRODUODENOSCOPY (EGD) WITH PROPOFOL N/A 04/09/2014   Procedure: ESOPHAGOGASTRODUODENOSCOPY (EGD) WITH PROPOFOL;  Surgeon: Cleotis Nipper, MD;  Location: [facility];  Service: Endoscopy;  Laterality: N/A;  . HEMORRHOID SURGERY    . HERNIA REPAIR    . KNEE ARTHROSCOPY       Current Outpatient Medications  Medication Sig Dispense Refill  . acetaminophen-codeine (TYLENOL #3) 300-30 MG tablet TAKE 1 TABLET BY MOUTH EVERY 6 TO 8 HOURS AS NEEDED FOR PAIN    . albuterol (PROAIR HFA) 108 (90 Base) MCG/ACT inhaler Inhale 2 puffs into the lungs every 4 (four) hours as needed for wheezing or shortness of breath. 18 g 5  . albuterol (PROVENTIL) (2.5 MG/3ML) 0.083% nebulizer solution Take 2.5 mg by nebulization every 4 (four) hours as needed for wheezing or shortness of breath (((PLAN C))).    Marland Kitchen ALPRAZolam (XANAX) 0.5 MG tablet Take 1 tablet (0.5 mg total) by mouth 2 (two) times daily as needed for anxiety. 30 tablet 0  . aspirin EC 81 MG tablet Take 81 mg by mouth daily.     Marland Kitchen atorvastatin (LIPITOR) 40 MG tablet Take 40 mg by mouth at bedtime.    . Biotin 5000 MCG CAPS Take 1 capsule by mouth daily.    . budesonide (PULMICORT) 0.5 MG/2ML nebulizer solution Take 2 mLs (0.5 mg total) by nebulization daily. 60 mL 5  . chlorpheniramine (CHLOR-TRIMETON) 4 MG tablet Take 4 mg by mouth every 4 (four) hours as needed for allergies.    Marland Kitchen dextromethorphan (DELSYM) 30 MG/5ML liquid 2 tsp every 12 hours as needed w/ flutter    . diltiazem (CARTIA XT) 240 MG 24 hr capsule Take 1 capsule (240 mg total) by mouth daily. 90 capsule 3  . divalproex (DEPAKOTE ER) 500 MG 24 hr tablet Take 2 tablets (1,000 mg total) by mouth at bedtime. 180 tablet 3  . esomeprazole (NEXIUM) 20 MG capsule Take 20 mg by mouth every morning.     . formoterol (PERFOROMIST) 20 MCG/2ML nebulizer solution Take 2 mLs (20 mcg total) by nebulization every morning. 60 mL 3  . hydrALAZINE  (APRESOLINE) 25 MG tablet TAKE 1 TABLET BY MOUTH TWICE DAILY 180 tablet 3  . memantine (NAMENDA) 10 MG tablet Take 1 tablet (10 mg total) by mouth 2 (two) times daily. 60 tablet 11  . metFORMIN (GLUCOPHAGE) 500 MG tablet Take 500 mg by mouth 2 (two) times daily with a meal.    . nebivolol (BYSTOLIC) 10 MG tablet Take 1 tablet (10 mg total) by mouth daily. 34 tablet 11  . nitroGLYCERIN (NITROSTAT) 0.4 MG SL tablet DISSOLVE ONE TABLET UNDER THE TONGUE EVERY 5 MINUTES AS NEEDED FOR CHEST PAIN.  DO NOT EXCEED A TOTAL OF 3 DOSES IN 15 MINUTES 25 tablet 0  . potassium chloride SA (K-DUR,KLOR-CON) 20 MEQ tablet Take 20 mEq  by mouth daily.     Marland Kitchen torsemide (DEMADEX) 20 MG tablet Take 20-60 mg by mouth daily. Take 1 tablet daily, can take another one or two as needed for swelling     No current facility-administered medications for this visit.    Allergies:   Patient has no known allergies.    Social History:  The patient  reports that she quit smoking about 15 years ago. Her smoking use included cigarettes. She has a 30.00 pack-year smoking history. She has never used smokeless tobacco. She reports that she does not drink alcohol or use drugs.   Family History:  The patient's family history includes Allergies in her mother; Asthma in her mother; CAD (age of onset: 62) in her father; CAD (age of onset: 46) in her sister; Congestive Heart Failure in her sister; Diabetes in her father; Hypertension in her father.    ROS:  General:no colds or fevers, + weight loss  Skin:no rashes or ulcers HEENT:no blurred vision, no congestion, loss of taste  CV:see HPI PUL:see HPI GI:no diarrhea constipation or melena, no indigestion GU:no hematuria, no dysuria MS:no joint pain, no claudication Neuro:no syncope, no lightheadedness, hx of seizures and recent memory issues. Endo:no diabetes, no thyroid disease  Wt Readings from Last 3 Encounters:  08/05/19 147 lb 12.8 oz (67 kg)  06/05/19 151 lb 3.2 oz (68.6 kg)    03/20/19 154 lb 3.2 oz (69.9 kg)     PHYSICAL EXAM: VS:  BP 116/62   Pulse 72   Ht 5\' 1"  (1.549 m)   Wt 147 lb 12.8 oz (67 kg)   LMP  (LMP Unknown)   BMI 27.93 kg/m  , BMI Body mass index is 27.93 kg/m. General:Pleasant affect, NAD Skin:Warm and dry, brisk capillary refill HEENT:normocephalic, sclera clear, mucus membranes moist Neck:supple, no JVD, no bruits  Heart:S1S2 RRR without murmur, gallup, rub or click Lungs:clear without rales, rhonchi, or wheezes [id number], non tender, + BS, do not palpate liver spleen or masses Ext:no lower ext edema, 2+ pedal pulses, 2+ radial pulses Neuro:alert and oriented X 3, MAE, follows commands, + facial symmetry    EKG:  EKG is NOT ordered today.    Recent Labs: 12/02/2018: ALT 11; BUN 25; Creatinine, Ser 1.14; Hemoglobin 11.2; Platelets 300; Potassium 4.3; Sodium 140 03/20/2019: TSH 0.994    Lipid Panel    Component Value Date/Time   CHOL 196 07/18/2016 0142   TRIG 203 (H) 07/18/2016 0142   HDL 48 07/18/2016 0142   CHOLHDL 4.1 07/18/2016 0142   VLDL 41 (H) 07/18/2016 0142   LDLCALC 107 (H) 07/18/2016 0142       Other studies Reviewed: Additional studies/ records that were reviewed today include: . Cardiac cath 01/18/2016:  Mid RCA lesion, 70 %stenosed.  Dist RCA lesion, 90 %stenosed.  A STENT SYNERGY DES [id number] drug eluting stent was successfully placed, and overlaps the 2.5 x 38 mm Synergy stent placed distally.  Prox RCA lesion, 80 %stenosed.  Post intervention, there is a 0% residual stenosis.  Ost Cx to Prox Cx lesion, 10 %stenosed.  Mid LAD lesion, 10 %stenosed.  1. NSTEMI 2. Severe single vessel CAD with severe stenosis in the mid and distal RCA 3. Successful PTCA/DES x 2 mid and distal RCA  ASSESSMENT AND PLAN:  1.  CAD with NSTEMI 2017 and 2 stents to RCA has done well since on ASA, statin and bystolic   No angina  2.  Chronic diastolic HF [id number], is doing well on current  torsemide.  No changes  euvolemic  3.  HLD continue statin, followed by PCP goal LDL <70  4.  Hx of seizures followed by Neuro  5.  Loss of taste leading to wt loss, she will check with PCP to eval.  6.  HTN controlled continue meds  She will follow up with Dr. Marlou [name] in 6 months.         Current medicines are reviewed with the patient today.  The patient Has no concerns regarding medicines.  The following changes have been made:  See above Labs/ tests ordered today include:see above  Disposition:   FU:  see above  Signed, Jacqueline Kicks, NP  08/05/2019 11:15 PM    [facility] [address], [place], [place], [place] Phone: [phone]; Fax: [phone]

## 2019-08-05 NOTE — Patient Instructions (Signed)
Medication Instructions:  Your physician recommends that you continue on your current medications as directed. Please refer to the Current Medication list given to you today.  *If you need a refill on your cardiac medications before your next appointment, please call your pharmacy*   Lab Work: None ordered  If you have labs (blood work) drawn today and your tests are completely normal, you will receive your results only by: Marland Kitchen MyChart Message (if you have MyChart) OR . A paper copy in the mail If you have any lab test that is abnormal or we need to change your treatment, we will call you to review the results.   Testing/Procedures: None ordered   Follow-Up: At [facility], you and your health needs are our priority.  As part of our continuing mission to provide you with exceptional heart care, we have created designated Provider Care Teams.  These Care Teams include your primary Cardiologist (physician) and Advanced Practice Providers (APPs -  Physician Assistants and Nurse Practitioners) who all work together to provide you with the care you need, when you need it.  We recommend signing up for the patient portal called "MyChart".  Sign up information is provided on this After Visit Summary.  MyChart is used to connect with patients for Virtual Visits (Telemedicine).  Patients are able to view lab/test results, encounter notes, upcoming appointments, etc.  Non-urgent messages can be sent to your provider as well.   To learn more about what you can do with MyChart, go to [email or web address].    Your next appointment:   6 month(s)  The format for your next appointment:   In Person  Provider:   You may see Candee Furbish, MD or one of the following Advanced Practice Providers on your designated Care Team:    Truitt Merle, NP  Cecilie Kicks, NP  Kathyrn Drown, NP

## 2019-08-14 ENCOUNTER — Other Ambulatory Visit: Payer: Self-pay | Admitting: Internal Medicine

## 2019-08-29 ENCOUNTER — Telehealth: Payer: Self-pay | Admitting: Internal Medicine

## 2019-08-29 NOTE — Telephone Encounter (Signed)
Ok to work at whatever level/ duration she feels comfortable with now that she's had both covid shots

## 2019-08-29 NOTE — Telephone Encounter (Signed)
Spoke with patient. She verbalized understanding. She stated that she knows that she will never be able to work full time again. She just wants to work 4 hours a day.   She stated that she will talk to her job about this. She thinks that she may need a note stating that she is cleared to go back to work. She wishes to have this to be mailed to her. Verified her address.   MW, please advise if you are ok with [place] writing a note for her. Thanks!

## 2019-08-29 NOTE — Telephone Encounter (Signed)
Spoke with pt. She is wanting to know if Dr. Melvyn Novas thinks it would be okay for her to go back to work part time. States that Dr. Melvyn Novas advised her to retire the last time she saw him.  Dr. Melvyn Novas - please advise. Thanks.

## 2019-08-30 NOTE — Telephone Encounter (Signed)
Yes that's fine but she also needs to be sure she has f/u ov in next 6 weeks to regroup re long term rx and expectations

## 2019-09-01 NOTE — Telephone Encounter (Signed)
Called pt and advised message from the provider. Pt understood and verbalized understanding. Nothing further is needed.    

## 2019-09-01 NOTE — Telephone Encounter (Signed)
She needs a letter to RTW. I composed letter and sent to [facility] and will mail pt a copy to her address as well. Nothing further is needed.

## 2019-09-09 ENCOUNTER — Encounter: Payer: Self-pay | Admitting: Internal Medicine

## 2019-09-09 ENCOUNTER — Ambulatory Visit (INDEPENDENT_AMBULATORY_CARE_PROVIDER_SITE_OTHER): Payer: Medicare Other | Admitting: Internal Medicine

## 2019-09-09 ENCOUNTER — Other Ambulatory Visit: Payer: Self-pay

## 2019-09-09 DIAGNOSIS — R05 Cough: Secondary | ICD-10-CM

## 2019-09-09 DIAGNOSIS — J4530 Mild persistent asthma, uncomplicated: Secondary | ICD-10-CM

## 2019-09-09 DIAGNOSIS — R058 Other specified cough: Secondary | ICD-10-CM

## 2019-09-09 MED ORDER — PREDNISONE 10 MG PO TABS: ORAL_TABLET | ORAL | 5 refills | Status: DC

## 2019-09-09 NOTE — Progress Notes (Signed)
Subjective:   Patient ID: [name], female    DOB: 02/03/1951    MRN: [id number]    Brief patient profile:  89   yobf quit smoking 2005 with nl pfts 2012  with recurrent non-specific resp flares with nl pfts during flares c/w pseudoasthma.     History of Present Illness  01/24/2016  Extended post hosp f/u ov/ transition of care/ /[name] re: dtca asthma/ vcd symb 160 and pred 5 x 10 mg daily  Chief Complaint  Patient presents with  . Follow-up    Pt. recently got out the [facility] for an asthma attack, Pt. states her breathing remains the same, coughing with some yellow to white mucus,Has been needing to use Ventolin more often  last doing well 2 months prior to OV   While on Symb 160/ gerd rx but not dosing ac as rec  Waking up prematurely x 2 months with cough/wheeze/ sob but never contacted me as per the instructions rec Plan A = Automatic = dulera 100 Take 2 puffs first thing in am and then another 2 puffs about 12 hours later.                                      Nexum 40 mg Take 30-60 min before first meal of the day and Pepcid AC 20 mg  at bedtime Work on inhaler technique  Plan B = Backup Only use your albuterol as a rescue medication Plan C = Crisis - only use your albuterol nebulizer if you first try Plan B and it fails to help > ok to use the nebulizer up to every 4 hours but if start needing it regularly call for immediate appointment GERD diet  Take delsym two tsp every 12 hours and use the flutter valve as much as possible and supplement if needed with  tramadol 50 mg up to 2 every 4 hours to suppress the urge to cough  Once you have eliminated the cough for 3 straight days try reducing the tramadol first,  then the delsym as tolerated.   Prednisone 5 mg x 8, then 6, then 4 then 2, then 1 daily x 2 days and off  please schedule a follow up office visit in 2 weeks, sooner if needed with all meds/ inhalers/ neb solutions in hand     09/18/2016  f/u ov/[name] re:  UACS vs  Asthma brought med calendar but not updated / on symb 160 / max gerd rx  Chief Complaint  Patient presents with  . Follow-up    Pt c/o occ wheezing. She has not used rescue inhaler or neb recently.   still feels wheezing  And doe = MMRC1 = can walk nl pace, flat grade, can't hurry or go uphills or steps s sob rec Change the symbicort to 80 Take 2 puffs first thing in am and then another 2 puffs about 12 hours later.  Work on inhaler technique:  Use the med calendar daily to organize your medications as a "checklist" See [name] NP in 4  weeks with all your medications> never happened    Admit date: 12/19/2017 Discharge date: 12/24/2017  D/c home with steroids taper, monitor blood glucose while on steroids She reports run out of pulmicort nebs, new prescription provided, she is to follow up with Dr Melvyn Novas to further discuss asthma/copd meds, she is concerned about cost of these meds  Discharge Diagnoses:      Active Hospital Problems   Diagnosis Date Noted  . Acute bronchitis with asthma with acute exacerbation 01/16/2016  . Acute renal failure superimposed on stage 3 chronic kidney disease ([place]) 07/17/2016  . Chronic diastolic CHF (congestive heart failure) ([place]) 07/17/2016  . Acute respiratory failure with hypoxia ([place])   . Upper airway cough syndrome vs Asthma  08/28/2014  . Diabetes mellitus type 2, controlled ([place]) 08/06/2014  . Essential hypertension 07/21/2008           12/28/2017 ext post hosp f/u ov/[name] re: confused with meds again / only slightly better breathing  Chief Complaint  Patient presents with  . Follow-up    Breathing has improved slightly. She has been out of brovana due to high cost x 3 days. She has had to use her albuterol inhaler and neb daily since then.   Dyspnea:  50 ft = About the same as usual Cough: after supper is the worse, but not much mucus Sleeping: wakes up coughing 30 degrees due to sensation of reflux    SABA use: last 5 h prior to OV     02: none  Sleeping at 30 degrees    rec Plan A = Automatic = Performist 20 mcg one vial  with pulmocort one half vial twice daily  Gabapentin 100 mg three times daily Nexium Take 30- 60 min before your first and last meals of the day and zantac (ranitidine) at bedtime  Along with chlorpheniramine 4 mg 1 or 2 also at bedtime (over the counter)  Continue prednisone 10 mg every day  Plan B = Backup for breathing Only use your albuterol as a rescue medication  Plan B = Backup for coughing  Add tramadol 50 mg 1-2 every 4 hours if needed  Plan C = Crisis - only use your albuterol nebulizer if you first try Plan B and it fails to help > ok to use the nebulizer up to every 4 hours but if start needing it regularly call for immediate appointment     03/11/2018  f/u ov/[name] re: 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 Follow-up    Cough had improved and then started back 1 day ago.  Cough is now non prod.  She is using her albuterol inhaler 2 x daily on average. She has not needed albuterol neb.    Dyspnea:  MMRC2 = can't walk a nl pace on a flat grade s sob but does fine slow and flat slowed by knee  Cough: noct > day  Sleeping: 45 degrees due to subjective reflux despite max acid suppresion  SABA use: as above - never noct 02: none   rec Gabapentin 100 mg four  times daily  Take chortab 4 mg x 2 at bedtime and up to every 4 hours during the day as needed for drainage  Take delsym two tsp every 12 hours and supplement if needed with  tramadol 50 mg up to 2 every 4 hours to suppress the urge to cough. Swallowing water and/or using ice chips/non mint and menthol containing candies (such as lifesavers or sugarless jolly ranchers) are also effective.  You should rest your voice and avoid activities that you know make you cough. Once you have eliminated the cough for 3 straight days try reducing the tramadol first,  then the delsym as tolerated.  Plan D = Prednisone x 6  days if not cough/ sob not better See calendar for  specific medication instructions and bring it back for each and every office visit for every healthcare provider you see.  Without it,  you may not receive the best quality medical care that we feel you deserve. Please schedule a follow up office visit in 4 weeks, call sooner if needed with all medications /inhalers/ solutions in hand so we can verify exactly what you are taking. This includes all medications from all doctors and over the [place] separate them into two bags:  the ones you take automatically, no matter what, vs the ones you take just when you feel you need them "BAG #2 is UP TO YOU"  - this will really help [place] help you take your medications more effectively.    03/15/18 back to ER - did not activate action plan prior (never took more than 1 tramadol every 12 hours to control cough.   03/20/2018  Extended post hops  f/u ov/Jonique Kulig re: transition of care re   vcd vs asthma  Chief Complaint  Patient presents with  . Follow-up    recent admission 03/15/18- pt reports of wheezing, sob with exertion, no prod cough & chest tightness.   Dyspnea:  MMRC4  = sob if tries to leave home or while getting dressed   - no better now vs during hosp stay. Seen by Dr [place] Bells who strongly supported vcd dx during admit Cough: dry cough settles down overnight and with plb as does the "wheeze"  Sleeping: 45 degrees SABA use: last neb 8 h prior to OV  "did not help" 02: no  rec Increase gabapentin to 100 mg take 2 (=200)  four times daily as per med calendar For cough > delsym 2 tsp every 12 hours with use of flutter valve as much as possible  and supplement with tramadol 50 mg up to 1-2 every 4 hours as needed  For breathing problems not improving with use of nebulizer > Prednisone 10 mg take  4 each am x 2 days,   2 each am x 2 days,  1 each am x 2 days and stop  GERD diet / lifestyle recs Keep previous appt with your medications in 2 bags     04/05/2018  f/u ov/Nydia Ytuarte re: uacs/ VCD - brought all meds but confused with gen vs trade names, could not tell me how she is supposed to take esmeprazole when I handed it to her.  Chief Complaint  Patient presents with  . Follow-up  Dyspnea:  50 ft  Cough: better  Sleeping: 45 degrees SABA use: twice a day hfa , rare neb 02: none   rec Gabapentin 300 mg four times daily  Budesonide 0.25 mcg twice daily with performist If breathing worse > Prednisone 10 mg take  4 each am x 2 days,   2 each am x 2 days,  1 each am x 2 days and stop  For cough > Take delsym two tsp every 12 hours and flutter valve as much as possible supplement if needed with  tramadol 50 mg (or tylenol #3) up to 2 every 4 hours to suppress the urge to cough. Swallowing water and/or using ice chips/non mint and menthol containing candies (such as lifesavers or sugarless jolly ranchers) are also effective.  You should rest your voice and avoid activities that you know make you cough. Once you have eliminated the cough for 3 straight days try reducing the tramadol first,  then the delsym as tolerated. Nexium (esmaprazole)  Take 30-  60 min before your first and last meals of the day  Please schedule a follow up office visit in 2  weeks, sooner if needed - needs full pfts on return      04/18/2018  f/u ov/Dean Goldner re: asthma vs vcd  - brought most but not all her meds - missing nexium, has budesonide in prn bag/ pill organizer has 2 slots per day and hard to read to top print on each slot Chief Complaint  Patient presents with  . Follow-up    PFT's done today. She c/o increased SOB for the past 3 days. She is using her albuterol inhaler and neb with albuterol both about 2 x per day.   Dyspnea:  Improved, able to do [employer] shopping now  Cough: varies s pattern, min mucoid  Sleeping: 45 degrees all pillows  SABA use: twice daily due to overdoing it at work rec Plan A = Automatic = Budesonide/perforomist twice daily as per med  calendar Plan B = Backup Only use your albuterol inhaler (prefer Proair- RED  but same as Ventolin-blue) as a rescue medication to be used if you can't catch your breath by resting or doing a relaxed purse lip breathing pattern.  - The less you use it, the better it will work when you need it. - Ok to use the inhaler up to 2 puffs  every 4 hours if you must but call for appointment if use goes up over your usual need - Don't leave home without it !!  (think of it like the spare tire for your car)  Plan C = Crisis - only use your albuterol nebulizer if you first try Plan B and it fails to help > ok to use the nebulizer up to every 4 hours but if start needing it regularly call for immediate appointment    02/04/19 televisit Stop gabapentin completely since you are only taking one at bedtime but add it back if start coughing again for any reason and ok to build up to 4 x daily  If your breathing gets worse, increase the Performist to 20 mcg (one vial) every 12 hours instead of just once a day.   03/12/2019  f/u ov/Griselle Rufer re: asthma with component of vcd / brought med calendar but not keeping it updated  Chief Complaint  Patient presents with  . Follow-up    Breathing is doing well today.  She rarely uses her albuterol inhaler or albuterol neb.    Dyspnea:  Not limited by breathing from desired activities  / walking neighborhood / chasing around gchildren Cough: none/ no need for prns Sleeping: on side 2 pillows SABA use: none / minimal need for prednisone or any prn x xanax 02: none  rec F/u med calendar   09/09/2019  f/u ov/Jaydis Duchene re: asthma /uacs/ ? Adherent (no med calendar /does not recognize the one we gave her dated 01/05/2019  Chief Complaint  Patient presents with  . Follow-up    Breathing is doing well. She rarely uses her albuterol. She has minimal dry cough and wheezing.   Dyspnea:  Walking neighborhood / chasing kids  Cough: usually assoc  with nasal congestion clear mucus    Sleeping: fine on 2pillows on side SABA use: rarely 02: none    No obvious day to day or daytime variability or assoc excess/ purulent sputum or mucus plugs or hemoptysis or cp or chest tightness, subjective wheeze or overt sinus or hb symptoms.   Sleeping  without nocturnal  or early am exacerbation  of respiratory  c/o's or need for noct saba. Also denies any obvious fluctuation of symptoms with weather or environmental changes or other aggravating or alleviating factors except as outlined above   No unusual exposure hx or h/o childhood pna/ asthma or knowledge of premature birth.  Current Allergies, Complete Past Medical History, Past Surgical History, Family History, and Social History were reviewed in [employer] record.  ROS  The following are not active complaints unless bolded Hoarseness, sore throat, dysphagia, dental problems, itching, sneezing,  nasal congestion or discharge of excess mucus or purulent secretions, ear ache,   fever, chills, sweats, unintended wt loss or wt gain, classically pleuritic or exertional cp,  orthopnea pnd or arm/hand swelling  or leg swelling, presyncope, palpitations, abdominal pain, anorexia, nausea, vomiting, diarrhea  or change in bowel habits or change in bladder habits, change in stools or change in urine, dysuria, hematuria,  rash, arthralgias, visual complaints, headache, numbness, weakness or ataxia or problems with walking or coordination,  change in mood or  memory.        Current Meds  Medication Sig  . acetaminophen-codeine (TYLENOL #3) 300-30 MG tablet TAKE 1 TABLET BY MOUTH EVERY 6 TO 8 HOURS AS NEEDED FOR PAIN  . albuterol (PROAIR HFA) 108 (90 Base) MCG/ACT inhaler Inhale 2 puffs into the lungs every 4 (four) hours as needed for wheezing or shortness of breath.  Marland Kitchen albuterol (PROVENTIL) (2.5 MG/3ML) 0.083% nebulizer solution Take 2.5 mg by nebulization every 4 (four) hours as needed for wheezing or shortness of breath  (((PLAN C))).  Marland Kitchen ALPRAZolam (XANAX) 0.5 MG tablet Take 1 tablet (0.5 mg total) by mouth 2 (two) times daily as needed for anxiety.  Marland Kitchen aspirin EC 81 MG tablet Take 81 mg by mouth daily.   Marland Kitchen atorvastatin (LIPITOR) 40 MG tablet Take 40 mg by mouth at bedtime.  . Biotin 5000 MCG CAPS Take 1 capsule by mouth daily.  . budesonide (PULMICORT) 0.5 MG/2ML nebulizer solution USE 2 ML IN NEBULIZER  ONCE DAILY  . chlorpheniramine (CHLOR-TRIMETON) 4 MG tablet Take 4 mg by mouth every 4 (four) hours as needed for allergies.  Marland Kitchen dextromethorphan (DELSYM) 30 MG/5ML liquid 2 tsp every 12 hours as needed w/ flutter  . diltiazem (CARTIA XT) 240 MG 24 hr capsule Take 1 capsule (240 mg total) by mouth daily.  . divalproex (DEPAKOTE ER) 500 MG 24 hr tablet Take 2 tablets (1,000 mg total) by mouth at bedtime.  Marland Kitchen esomeprazole (NEXIUM) 20 MG capsule Take 20 mg by mouth every morning.   . formoterol (PERFOROMIST) 20 MCG/2ML nebulizer solution Take 2 mLs (20 mcg total) by nebulization every morning.  . hydrALAZINE (APRESOLINE) 25 MG tablet TAKE 1 TABLET BY MOUTH TWICE DAILY  . memantine (NAMENDA) 10 MG tablet Take 1 tablet (10 mg total) by mouth 2 (two) times daily.  . metFORMIN (GLUCOPHAGE) 500 MG tablet Take 500 mg by mouth 2 (two) times daily with a meal.  . nebivolol (BYSTOLIC) 10 MG tablet Take 1 tablet (10 mg total) by mouth daily.  . nitroGLYCERIN (NITROSTAT) 0.4 MG SL tablet DISSOLVE ONE TABLET UNDER THE TONGUE EVERY 5 MINUTES AS NEEDED FOR CHEST PAIN.  DO NOT EXCEED A TOTAL OF 3 DOSES IN 15 MINUTES  . potassium chloride SA (K-DUR,KLOR-CON) 20 MEQ tablet Take 20 mEq by mouth daily.   Marland Kitchen Respiratory Therapy Supplies (FLUTTER) DEVI by Does not apply route.  . torsemide (DEMADEX) 20 MG tablet Take  20-60 mg by mouth daily. Take 1 tablet daily, can take another one or two as needed for swelling                Objective:   Physical Exam  09/09/2019     147  03/12/2019   157  11/04/2018       157  02/23/2016   168  >  04/07/2016  168 > 05/08/2016    168 >  07/17/2016  162 > 09/18/2016    164  > 09/18/2017  160 > 12/28/2017 167  > 03/11/2018  164 > 03/20/2018   160 > 04/05/2018   171 > 04/18/2018  165   Vital signs reviewed  09/09/2019  - Note at rest 02 sats  100% on RA    amb pleasant bf nad prominent pseudowheeze resolves with flutter and plm   HEENT : pt wearing mask not removed for exam due to covid -19 concerns.    NECK :  without JVD/Nodes/TM/ nl carotid upstrokes bilaterally   LUNGS: no acc muscle use,  Nl contour chest which is clear to A and P bilaterally without cough on insp or exp maneuvers   CV:  RRR  no s3 or murmur or increase in P2, and no edema   ABD:  soft and nontender with nl inspiratory excursion in the supine position. No bruits or organomegaly appreciated, bowel sounds nl  MS:  Nl gait/ ext warm without deformities, calf tenderness, cyanosis or clubbing No obvious joint restrictions   SKIN: warm and dry without lesions    NEURO:  alert, approp, nl sensorium with  no motor or cerebellar deficits apparent.

## 2019-09-09 NOTE — Patient Instructions (Signed)
Automatically when you have a flare of coughing or short of breath >>>  add the second nebulizer treatment 12 hours after the 1st   If not effective >  Prednisone 10 mg take  4 each am x 2 days,   2 each am x 2 days,  1 each am x 2 days and stop    Please schedule a follow up visit in 6  months but call sooner if needed

## 2019-09-10 ENCOUNTER — Encounter: Payer: Self-pay | Admitting: Internal Medicine

## 2019-09-10 NOTE — Assessment & Plan Note (Signed)
Onset of symptoms reported  2012 p quit smoking  2005  - FEN0 01/24/2016 = 13 with active symptoms on symb 160 2bid > changed to dulera 100  - Spirometry 02/23/2016  NO signifiant obstruction with active symptoms  - 02/23/2016  After extensive coaching HFA effectiveness =    75%  - FENO 02/23/2016  =  12   On dulera 100 2bid (unable to confirm adeherence as did not bring it - Singulair 10 mg daily added 02/23/2016 to dulera 100 2bid with 2 week sample only  (unable to verify she used it) - Allergy profile 02/23/16  >  Eos 0.0 /  IgE  87 pos  RAST  Grass/ trees  - 04/07/2016   symb 80 x one sample given - FENO 04/07/2016  =   43 ? Really on meds ?  - 05/08/2016  After extensive coaching HFA effectiveness =    75% > continue symb 80 2bid - 07/17/2016 added singulair maint   - FENO 09/18/2016  =   15 on symb 160 2bid > try 80 2bid  - flare late feb 2019 ? What maint rx she was taking but on admit "non-adherent per notes 08/12/17  - 09/18/2017  After extensive coaching inhaler device  effectiveness =    75% from a baseline of 50% > restart symb 80 2bid - FENO 09/18/2017  =   20 on symb 160  - Spirometry 09/18/2017  No obstruction at all with active wheeze -  09/09/2019 added prednisone as "plan D"    Very difficult to sort out how much true asthma she has so rec she add a second dose of performist/bud in pm any time she has flare of cough or sob and if not effecitve then add pred x 6 days as "Plan D " on her med calendar    Each maintenance medication was reviewed in detail including most importantly the difference between maintenance and as needed and under what circumstances the prns are to be used. This was done in the context of a medication calendar review which provided the patient with a user-friendly unambiguous mechanism for medication administration and reconciliation and provides an action plan for all active problems. It is critical that this be shown to every doctor  for modification during the  office visit if necessary so the patient can use it as a working document.              .  Total time for H and P, chart review, counseling, reviewing device (flutter valve) , med calendar,   and generating customized AVS unique to this office visit / charting = 30 min

## 2019-09-10 NOTE — Assessment & Plan Note (Signed)
-   CT sinus 08/06/14 > neg  - Allergy profile 02/23/16  >  Eos 0.0 /  IgE  87 pos  RAST  Grass/ trees  - improved 05/08/2016 on gabapentin 300 tid > no change rx > d/c'd 07/2016 DgEs 10/09/17  Low range of penetration without aspiration. Otherwise, normal - Asthma exac 12/17/17- FENO 18. Significant upper airway wheeze, tx pred taper. Add neurontin 100mg  TID.  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

## 2019-09-13 ENCOUNTER — Emergency Department (HOSPITAL_COMMUNITY): Payer: Medicare Other

## 2019-09-13 ENCOUNTER — Inpatient Hospital Stay (HOSPITAL_COMMUNITY)
Admission: EM | Admit: 2019-09-13 | Discharge: 2019-09-17 | DRG: 202 | Disposition: A | Discharge status: Home Health | Payer: Medicare Other | Attending: Internal Medicine | Admitting: Internal Medicine

## 2019-09-13 ENCOUNTER — Other Ambulatory Visit: Payer: Self-pay

## 2019-09-13 ENCOUNTER — Encounter (HOSPITAL_COMMUNITY): Payer: Self-pay | Admitting: *Deleted

## 2019-09-13 DIAGNOSIS — Z87891 Personal history of nicotine dependence: Secondary | ICD-10-CM

## 2019-09-13 DIAGNOSIS — E785 Hyperlipidemia, unspecified: Secondary | ICD-10-CM | POA: Diagnosis present

## 2019-09-13 DIAGNOSIS — J449 Chronic obstructive pulmonary disease, unspecified: Secondary | ICD-10-CM | POA: Diagnosis present

## 2019-09-13 DIAGNOSIS — K219 Gastro-esophageal reflux disease without esophagitis: Secondary | ICD-10-CM | POA: Diagnosis present

## 2019-09-13 DIAGNOSIS — R062 Wheezing: Secondary | ICD-10-CM | POA: Diagnosis present

## 2019-09-13 DIAGNOSIS — I130 Hypertensive heart and chronic kidney disease with heart failure and stage 1 through stage 4 chronic kidney disease, or unspecified chronic kidney disease: Secondary | ICD-10-CM | POA: Diagnosis present

## 2019-09-13 DIAGNOSIS — Z79899 Other long term (current) drug therapy: Secondary | ICD-10-CM

## 2019-09-13 DIAGNOSIS — Z7952 Long term (current) use of systemic steroids: Secondary | ICD-10-CM

## 2019-09-13 DIAGNOSIS — J4550 Severe persistent asthma, uncomplicated: Secondary | ICD-10-CM | POA: Diagnosis present

## 2019-09-13 DIAGNOSIS — E876 Hypokalemia: Secondary | ICD-10-CM | POA: Diagnosis not present

## 2019-09-13 DIAGNOSIS — Z833 Family history of diabetes mellitus: Secondary | ICD-10-CM

## 2019-09-13 DIAGNOSIS — Z8249 Family history of ischemic heart disease and other diseases of the circulatory system: Secondary | ICD-10-CM

## 2019-09-13 DIAGNOSIS — E119 Type 2 diabetes mellitus without complications: Secondary | ICD-10-CM

## 2019-09-13 DIAGNOSIS — Z955 Presence of coronary angioplasty implant and graft: Secondary | ICD-10-CM

## 2019-09-13 DIAGNOSIS — Z20822 Contact with and (suspected) exposure to covid-19: Secondary | ICD-10-CM | POA: Diagnosis present

## 2019-09-13 DIAGNOSIS — E1165 Type 2 diabetes mellitus with hyperglycemia: Secondary | ICD-10-CM | POA: Diagnosis present

## 2019-09-13 DIAGNOSIS — Z7982 Long term (current) use of aspirin: Secondary | ICD-10-CM

## 2019-09-13 DIAGNOSIS — F419 Anxiety disorder, unspecified: Secondary | ICD-10-CM | POA: Diagnosis present

## 2019-09-13 DIAGNOSIS — Z825 Family history of asthma and other chronic lower respiratory diseases: Secondary | ICD-10-CM

## 2019-09-13 DIAGNOSIS — J45901 Unspecified asthma with (acute) exacerbation: Secondary | ICD-10-CM | POA: Diagnosis present

## 2019-09-13 DIAGNOSIS — N183 Chronic kidney disease, stage 3 unspecified: Secondary | ICD-10-CM | POA: Diagnosis present

## 2019-09-13 DIAGNOSIS — R0602 Shortness of breath: Secondary | ICD-10-CM

## 2019-09-13 DIAGNOSIS — J4551 Severe persistent asthma with (acute) exacerbation: Secondary | ICD-10-CM | POA: Diagnosis not present

## 2019-09-13 DIAGNOSIS — F329 Major depressive disorder, single episode, unspecified: Secondary | ICD-10-CM | POA: Diagnosis present

## 2019-09-13 DIAGNOSIS — I5032 Chronic diastolic (congestive) heart failure: Secondary | ICD-10-CM | POA: Diagnosis present

## 2019-09-13 DIAGNOSIS — T380X5A Adverse effect of glucocorticoids and synthetic analogues, initial encounter: Secondary | ICD-10-CM | POA: Diagnosis present

## 2019-09-13 DIAGNOSIS — N179 Acute kidney failure, unspecified: Secondary | ICD-10-CM | POA: Diagnosis present

## 2019-09-13 DIAGNOSIS — I252 Old myocardial infarction: Secondary | ICD-10-CM

## 2019-09-13 DIAGNOSIS — I10 Essential (primary) hypertension: Secondary | ICD-10-CM | POA: Diagnosis present

## 2019-09-13 DIAGNOSIS — Z7951 Long term (current) use of inhaled steroids: Secondary | ICD-10-CM

## 2019-09-13 DIAGNOSIS — R058 Other specified cough: Secondary | ICD-10-CM | POA: Diagnosis present

## 2019-09-13 DIAGNOSIS — Z7984 Long term (current) use of oral hypoglycemic drugs: Secondary | ICD-10-CM

## 2019-09-13 DIAGNOSIS — I2510 Atherosclerotic heart disease of native coronary artery without angina pectoris: Secondary | ICD-10-CM | POA: Diagnosis present

## 2019-09-13 LAB — CBC
HCT: 35.4 % — ABNORMAL LOW (ref 36.0–46.0)
Hemoglobin: 11.5 g/dL — ABNORMAL LOW (ref 12.0–15.0)
MCH: 31.6 pg (ref 26.0–34.0)
MCHC: 32.5 g/dL (ref 30.0–36.0)
MCV: 97.3 fL (ref 80.0–100.0)
Platelets: 280 10*3/uL (ref 150–400)
RBC: 3.64 MIL/uL — ABNORMAL LOW (ref 3.87–5.11)
RDW: 13.9 % (ref 11.5–15.5)
WBC: 9.4 10*3/uL (ref 4.0–10.5)
nRBC: 0 % (ref 0.0–0.2)

## 2019-09-13 LAB — BASIC METABOLIC PANEL
Anion gap: 14 (ref 5–15)
BUN: 23 mg/dL (ref 8–23)
CO2: 26 mmol/L (ref 22–32)
Calcium: 9.1 mg/dL (ref 8.9–10.3)
Chloride: 102 mmol/L (ref 98–111)
Creatinine, Ser: 1.31 mg/dL — ABNORMAL HIGH (ref 0.44–1.00)
GFR calc Af Amer: 48 mL/min — ABNORMAL LOW (ref >60)
GFR calc non Af Amer: 41 mL/min — ABNORMAL LOW (ref >60)
Glucose, Bld: 166 mg/dL — ABNORMAL HIGH (ref 70–99)
Potassium: 4.2 mmol/L (ref 3.5–5.1)
Sodium: 142 mmol/L (ref 135–145)

## 2019-09-13 LAB — TROPONIN I (HIGH SENSITIVITY)
Troponin I (High Sensitivity): 9 ng/L (ref <18)
Troponin I (High Sensitivity): 7 ng/L (ref <18)

## 2019-09-13 LAB — POC SARS CORONAVIRUS 2 AG -  ED: SARS Coronavirus 2 Ag: NEGATIVE

## 2019-09-13 MED ORDER — NEBIVOLOL HCL 10 MG PO TABS
10.0000 mg | ORAL_TABLET | Freq: Every day | ORAL | Status: DC
  Administered 2019-09-14 – 2019-09-17 (×4): 10 mg via ORAL
  Filled 2019-09-13 (×4): qty 1

## 2019-09-13 MED ORDER — ADULT MULTIVITAMIN W/MINERALS CH
1.0000 | ORAL_TABLET | Freq: Every day | ORAL | Status: DC
  Administered 2019-09-14 – 2019-09-17 (×4): 1 via ORAL
  Filled 2019-09-13 (×4): qty 1

## 2019-09-13 MED ORDER — POTASSIUM CHLORIDE CRYS ER 20 MEQ PO TBCR
20.0000 meq | EXTENDED_RELEASE_TABLET | Freq: Every day | ORAL | Status: DC
  Administered 2019-09-14 – 2019-09-15 (×2): 20 meq via ORAL
  Filled 2019-09-13 (×2): qty 1

## 2019-09-13 MED ORDER — DILTIAZEM HCL ER COATED BEADS 240 MG PO CP24
240.0000 mg | ORAL_CAPSULE | Freq: Every day | ORAL | Status: DC
  Administered 2019-09-14 – 2019-09-17 (×4): 240 mg via ORAL
  Filled 2019-09-13: qty 2
  Filled 2019-09-13 (×2): qty 1
  Filled 2019-09-13: qty 2
  Filled 2019-09-13: qty 1
  Filled 2019-09-13 (×2): qty 2
  Filled 2019-09-13: qty 1

## 2019-09-13 MED ORDER — ALBUTEROL SULFATE HFA 108 (90 BASE) MCG/ACT IN AERS: 2.0000 | INHALATION_SPRAY | RESPIRATORY_TRACT | Status: DC | PRN

## 2019-09-13 MED ORDER — FORMOTEROL FUMARATE 20 MCG/2ML IN NEBU: 20.0000 ug | INHALATION_SOLUTION | RESPIRATORY_TRACT | Status: DC

## 2019-09-13 MED ORDER — ENOXAPARIN SODIUM 40 MG/0.4ML ~~LOC~~ SOLN
40.0000 mg | SUBCUTANEOUS | Status: DC
  Administered 2019-09-14 – 2019-09-17 (×4): 40 mg via SUBCUTANEOUS
  Filled 2019-09-13 (×4): qty 0.4

## 2019-09-13 MED ORDER — ASPIRIN EC 81 MG PO TBEC
81.0000 mg | DELAYED_RELEASE_TABLET | Freq: Every day | ORAL | Status: DC
  Administered 2019-09-14 – 2019-09-17 (×4): 81 mg via ORAL
  Filled 2019-09-13 (×4): qty 1

## 2019-09-13 MED ORDER — MAGNESIUM SULFATE 2 GM/50ML IV SOLN
2.0000 g | Freq: Once | INTRAVENOUS | Status: AC
  Administered 2019-09-14: 2 g via INTRAVENOUS
  Filled 2019-09-13: qty 50

## 2019-09-13 MED ORDER — SODIUM CHLORIDE 0.9% FLUSH
3.0000 mL | Freq: Once | INTRAVENOUS | Status: AC
  Administered 2019-09-13: 3 mL via INTRAVENOUS

## 2019-09-13 MED ORDER — ACETAMINOPHEN-CODEINE #3 300-30 MG PO TABS: 1.0000 | ORAL_TABLET | Freq: Four times a day (QID) | ORAL | Status: DC | PRN

## 2019-09-13 MED ORDER — MEMANTINE HCL 10 MG PO TABS
10.0000 mg | ORAL_TABLET | Freq: Two times a day (BID) | ORAL | Status: DC
  Administered 2019-09-14 – 2019-09-17 (×7): 10 mg via ORAL
  Filled 2019-09-13 (×9): qty 1

## 2019-09-13 MED ORDER — ALBUTEROL SULFATE HFA 108 (90 BASE) MCG/ACT IN AERS
4.0000 | INHALATION_SPRAY | Freq: Once | RESPIRATORY_TRACT | Status: AC
  Administered 2019-09-13: 4 via RESPIRATORY_TRACT
  Filled 2019-09-13: qty 6.7

## 2019-09-13 MED ORDER — ALBUTEROL (5 MG/ML) CONTINUOUS INHALATION SOLN
10.0000 mg/h | INHALATION_SOLUTION | RESPIRATORY_TRACT | Status: DC
  Administered 2019-09-13 (×2): 10 mg/h via RESPIRATORY_TRACT
  Filled 2019-09-13: qty 20

## 2019-09-13 MED ORDER — IPRATROPIUM-ALBUTEROL 0.5-2.5 (3) MG/3ML IN SOLN
3.0000 mL | RESPIRATORY_TRACT | Status: DC | PRN
  Administered 2019-09-13: 3 mL via RESPIRATORY_TRACT

## 2019-09-13 MED ORDER — HYDRALAZINE HCL 20 MG/ML IJ SOLN
10.0000 mg | INTRAMUSCULAR | Status: DC | PRN
  Administered 2019-09-16: 20 mg via INTRAVENOUS
  Filled 2019-09-13: qty 1

## 2019-09-13 MED ORDER — ALPRAZOLAM 0.5 MG PO TABS
0.5000 mg | ORAL_TABLET | Freq: Two times a day (BID) | ORAL | Status: DC | PRN
  Administered 2019-09-14 – 2019-09-16 (×5): 0.5 mg via ORAL
  Filled 2019-09-13 (×5): qty 1

## 2019-09-13 MED ORDER — HYDRALAZINE HCL 25 MG PO TABS
25.0000 mg | ORAL_TABLET | Freq: Three times a day (TID) | ORAL | Status: DC
  Administered 2019-09-14 – 2019-09-17 (×11): 25 mg via ORAL
  Filled 2019-09-13 (×12): qty 1

## 2019-09-13 MED ORDER — DIVALPROEX SODIUM ER 500 MG PO TB24
1000.0000 mg | ORAL_TABLET | Freq: Every day | ORAL | Status: DC
  Administered 2019-09-14 – 2019-09-16 (×4): 1000 mg via ORAL
  Filled 2019-09-13 (×6): qty 2

## 2019-09-13 MED ORDER — METFORMIN HCL 500 MG PO TABS: 500.0000 mg | ORAL_TABLET | Freq: Two times a day (BID) | ORAL | Status: DC

## 2019-09-13 MED ORDER — DEXTROMETHORPHAN POLISTIREX ER 30 MG/5ML PO SUER
60.0000 mg | Freq: Every day | ORAL | Status: DC | PRN
  Administered 2019-09-17: 60 mg via ORAL
  Filled 2019-09-13 (×2): qty 10

## 2019-09-13 MED ORDER — IPRATROPIUM BROMIDE HFA 17 MCG/ACT IN AERS
2.0000 | INHALATION_SPRAY | Freq: Once | RESPIRATORY_TRACT | Status: DC
  Filled 2019-09-13: qty 12.9

## 2019-09-13 MED ORDER — BUDESONIDE 0.5 MG/2ML IN SUSP: 0.5000 mg | Freq: Every day | RESPIRATORY_TRACT | Status: DC

## 2019-09-13 MED ORDER — BUDESONIDE 0.5 MG/2ML IN SUSP
0.5000 mg | Freq: Two times a day (BID) | RESPIRATORY_TRACT | Status: DC
  Administered 2019-09-13 – 2019-09-17 (×8): 0.5 mg via RESPIRATORY_TRACT
  Filled 2019-09-13 (×9): qty 2

## 2019-09-13 MED ORDER — ATORVASTATIN CALCIUM 40 MG PO TABS
40.0000 mg | ORAL_TABLET | Freq: Every day | ORAL | Status: DC
  Administered 2019-09-14 – 2019-09-16 (×4): 40 mg via ORAL
  Filled 2019-09-13 (×4): qty 1

## 2019-09-13 MED ORDER — METHYLPREDNISOLONE SODIUM SUCC 125 MG IJ SOLR
125.0000 mg | Freq: Once | INTRAMUSCULAR | Status: AC
  Administered 2019-09-13: 125 mg via INTRAVENOUS
  Filled 2019-09-13: qty 2

## 2019-09-13 MED ORDER — PREDNISONE 20 MG PO TABS
40.0000 mg | ORAL_TABLET | Freq: Every day | ORAL | Status: DC
  Administered 2019-09-14 – 2019-09-15 (×2): 40 mg via ORAL
  Filled 2019-09-13: qty 4
  Filled 2019-09-13 (×3): qty 2

## 2019-09-13 MED ORDER — ALBUTEROL SULFATE (2.5 MG/3ML) 0.083% IN NEBU
2.5000 mg | INHALATION_SOLUTION | RESPIRATORY_TRACT | Status: DC | PRN
  Administered 2019-09-14 – 2019-09-17 (×3): 2.5 mg via RESPIRATORY_TRACT
  Filled 2019-09-13 (×3): qty 3

## 2019-09-13 NOTE — ED Provider Notes (Signed)
[facility] EMERGENCY DEPARTMENT Provider Note   CSN: [id number] Arrival date & time: 09/13/19  1856     History Chief Complaint  Patient presents with  . Asthma  . Shortness of Breath    [name] is a 69 y.o. female.  Presents to the emergency department with chief complaint of shortness of breath.  Patient states that she has been having shortness of breath for the past couple weeks, saw her primary doctor last week Tuesday then put her on a prednisone taper, reports initially was feeling better however the past day or so she has been having worsening of her wheezing and shortness of breath.  Has albuterol and nebulizer machine at home which has not helped significantly.  No fever, chest pain, sick contacts.  HPI     Past Medical History:  Diagnosis Date  . Anxiety   . Asthma   . Bronchitis   . Chronic diastolic CHF (congestive heart failure) ([place])   . Chronic diastolic CHF (congestive heart failure) ([place]) 07/17/2016  . Coronary artery disease    a. NSTEMI 12/2015 - LHC 01/18/16: s/p overlapping DESx2 to RCA, 10% ost-prox Cx,10% mLAD.  Marland Kitchen Depression   . Diabetes mellitus   . Dyslipidemia   . GERD (gastroesophageal reflux disease)   . Heart attack ([place])   . Hypertension   . Hypertensive heart disease   . NSTEMI (non-ST elevated myocardial infarction) ([place]) 12/2015  . Pneumonia     Patient Active Problem List   Diagnosis Date Noted  . Memory loss 03/20/2019  . Severe persistent asthma without complication [id number]  . DM2 (diabetes mellitus, type 2) ([place]) 03/15/2018  . CKD (chronic kidney disease), stage III 03/15/2018  . Dyspnea 08/12/2017  . Macrocytic anemia 08/12/2017  . Therapeutic drug monitoring 05/17/2017  . Lung nodule 08/01/2016  . Influenza B 07/18/2016  . Chronic diastolic CHF (congestive heart failure) ([place]) 07/17/2016  . Acute renal failure superimposed on stage 3 chronic kidney disease ([place]) 07/17/2016  . Right leg swelling  02/27/2016  . Acute respiratory failure with hypoxia ([place])   . Uncontrolled type 2 diabetes mellitus with complication ([place])   . Acute pulmonary embolism ([place])   . HLD (hyperlipidemia)   . Seizures ([place]) 01/27/2016  . Numbness 01/27/2016  . CAD in native artery 01/20/2016  . Hypertensive heart disease 01/20/2016  . NSTEMI (non-ST elevated myocardial infarction) ([place]) 01/18/2016  . Acute bronchitis with asthma with acute exacerbation 01/16/2016  . Hypokalemia 01/16/2016  . Hypomagnesemia 01/16/2016  . Acute respiratory failure ([place]) 01/16/2016  . Dyspnea on exertion   . Asthma exacerbation 07/21/2015  . Upper airway cough syndrome with VCD 08/28/2014  . Diabetes mellitus type 2, controlled ([place]) 08/06/2014  . Hyperlipidemia 08/06/2014  . Allergic rhinitis 07/22/2008  . 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 DYSPNEA 07/22/2008  . Diabetes mellitus type 2, uncontrolled ([place]) 07/21/2008  . DIABETIC PERIPHERAL NEUROPATHY 07/21/2008  . Depression with anxiety 07/21/2008  . Essential hypertension 07/21/2008  . Mild persistent chronic asthma without complication [id number]  . G E R D 07/21/2008  . PULMONARY EMBOLISM, HX OF 07/21/2008    Past Surgical History:  Procedure Laterality Date  . ABDOMINAL HYSTERECTOMY    . CARDIAC CATHETERIZATION  1994   minimal LAD dz, no other dz, EF normal  . CARDIAC CATHETERIZATION N/A 01/18/2016   Procedure: Left Heart Cath and Coronary Angiography;  Surgeon: Burnell Blanks, MD;  Location: [facility];  Service: Cardiovascular;  Laterality: N/A;  . CARDIAC CATHETERIZATION N/A 01/18/2016   Procedure: Coronary Stent Intervention;  Surgeon: Burnell Blanks, MD;  Location: [facility];  Service: Cardiovascular;  Laterality: N/A;  . COLONOSCOPY WITH PROPOFOL N/A 04/09/2014   Procedure: COLONOSCOPY WITH PROPOFOL;  Surgeon: Cleotis Nipper, MD;  Location: [facility];  Service: Endoscopy;  Laterality: N/A;  . CORONARY STENT  PLACEMENT  01/19/2016   STENT SYNERGY DES [id number] drug eluting stent was successfully placed, and overlaps the 2.5 x 38 mm Synergy stent placed distally.  . ESOPHAGOGASTRODUODENOSCOPY (EGD) WITH PROPOFOL N/A 04/09/2014   Procedure: ESOPHAGOGASTRODUODENOSCOPY (EGD) WITH PROPOFOL;  Surgeon: Cleotis Nipper, MD;  Location: [facility];  Service: Endoscopy;  Laterality: N/A;  . HEMORRHOID SURGERY    . HERNIA REPAIR    . KNEE ARTHROSCOPY       OB History   No obstetric history on file.     Family History  Problem Relation Age of Onset  . Allergies Mother   . Asthma Mother   . CAD Father 32  . Diabetes Father   . Hypertension Father   . Congestive Heart Failure Sister        died in her 63s  . CAD Sister 68    Social History   Tobacco Use  . Smoking status: Former Smoker    Packs/day: 1.00    Years: 30.00    Pack years: 30.00    Types: Cigarettes    Quit date: 01/09/2004    Years since quitting: 15.6  . Smokeless tobacco: Never Used  Substance Use Topics  . Alcohol use: No    Alcohol/week: 0.0 standard drinks  . Drug use: No    Home Medications Prior to Admission medications   Medication Sig Start Date End Date Taking? Authorizing Provider  acetaminophen-codeine (TYLENOL #3) 300-30 MG tablet Take 1 tablet by mouth every 6 (six) hours as needed for severe pain.  12/06/18  Yes [provider]  albuterol (PROAIR HFA) 108 (90 Base) MCG/ACT inhaler Inhale 2 puffs into the lungs every 4 (four) hours as needed for wheezing or shortness of breath. 04/08/19  Yes Tanda Rockers, MD  albuterol (PROVENTIL) (2.5 MG/3ML) 0.083% nebulizer solution Take 2.5 mg by nebulization every 4 (four) hours as needed for wheezing or shortness of breath.    Yes [provider]  ALPRAZolam (XANAX) 0.5 MG tablet Take 1 tablet (0.5 mg total) by mouth 2 (two) times daily as needed for anxiety. 07/31/15  Yes Theodis Blaze, MD  aspirin EC 81 MG tablet Take 81 mg by mouth daily.    Yes  [provider]  atorvastatin (LIPITOR) 40 MG tablet Take 40 mg by mouth at bedtime.   Yes [provider]  Biotin 5000 MCG CAPS Take 1 capsule by mouth daily.   Yes [provider]  budesonide (PULMICORT) 0.5 MG/2ML nebulizer solution USE 2 ML IN NEBULIZER  ONCE DAILY Patient taking differently: Take 0.5 mg by nebulization daily.  08/14/19  Yes Tanda Rockers, MD  chlorpheniramine (CHLOR-TRIMETON) 4 MG tablet Take 4 mg by mouth 2 (two) times daily as needed for allergies or rhinitis.    Yes [provider]  dextromethorphan (DELSYM) 30 MG/5ML liquid Take 60 mg by mouth daily as needed for cough. 2 tsp every 12 hours as needed w/ flutter    Yes [provider]  diltiazem (CARTIA XT) 240 MG 24 hr capsule Take 1 capsule (240 mg total) by mouth daily. 02/05/19  Yes Jerline Pain, MD  divalproex (DEPAKOTE ER) 500 MG 24 hr tablet Take 2 tablets (1,000 mg total) by mouth at bedtime. 06/05/19  Yes Suzzanne Cloud, NP  esomeprazole (NEXIUM) 20 MG capsule Take 20 mg by mouth every morning.    Yes [provider]  formoterol (PERFOROMIST) 20 MCG/2ML nebulizer solution Take 2 mLs (20 mcg total) by nebulization every morning. Patient taking differently: Take 20 mcg by nebulization daily.  12/03/18  Yes Tanda Rockers, MD  hydrALAZINE (APRESOLINE) 25 MG tablet TAKE 1 TABLET BY MOUTH TWICE DAILY Patient taking differently: Take 25 mg by mouth in the morning and at bedtime.  02/05/19  Yes Jerline Pain, MD  memantine (NAMENDA) 10 MG tablet Take 1 tablet (10 mg total) by mouth 2 (two) times daily. 07/08/19  Yes Suzzanne Cloud, NP  metFORMIN (GLUCOPHAGE) 500 MG tablet Take 500 mg by mouth 2 (two) times daily with a meal.   Yes [provider]  Multiple Vitamin (MULTIVITAMIN WITH MINERALS) TABS tablet Take 1 tablet by mouth daily.   Yes [provider]  nebivolol (BYSTOLIC) 10 MG tablet Take 1 tablet (10 mg total) by mouth daily. 04/10/19  Yes Jerline Pain, MD  nitroGLYCERIN (NITROSTAT) 0.4 MG SL tablet DISSOLVE ONE TABLET UNDER THE TONGUE EVERY 5 MINUTES AS NEEDED FOR CHEST PAIN.  DO NOT EXCEED A TOTAL OF 3 DOSES IN 15 MINUTES Patient taking differently: Place 0.4 mg under the tongue every 5 (five) minutes as needed.  12/03/18  Yes Burtis Junes, NP  potassium chloride SA (K-DUR,KLOR-CON) 20 MEQ tablet Take 20 mEq by mouth daily.    Yes [provider]  predniSONE (DELTASONE) 10 MG tablet Take  4 each am x 2 days,   2 each am x 2 days,  1 each am x 2 days and stop Patient taking differently: Take 10 mg by mouth See admin instructions. Start date 09/08/19.Takes  4 tablets by mouth for 2 days then 2 tablets for  2 days then 1 tablet for 2 days  and stop. 09/09/19  Yes Tanda Rockers, MD  torsemide (DEMADEX) 20 MG tablet Take 20 mg by mouth daily as needed (for swelling).  10/25/16  Yes [provider]  Respiratory Therapy Supplies (FLUTTER) DEVI by Does not apply route.    [provider]    Allergies    Patient has no known allergies.  Review of Systems   Review of Systems  Constitutional: Negative for chills and fever.  HENT: Negative for ear pain and sore throat.   Eyes: Negative for pain and visual disturbance.  Respiratory: Positive for cough and shortness of breath.   Cardiovascular: Negative for chest pain and palpitations.  Gastrointestinal: Negative for abdominal pain and vomiting.  Genitourinary: Negative for dysuria and hematuria.  Musculoskeletal: Negative for arthralgias and back pain.  Skin: Negative for color change and rash.  Neurological: Negative for seizures and syncope.  All other systems reviewed and are negative.   Physical Exam Updated Vital Signs BP (!) 190/85   Pulse 96   Temp 98.7 F (37.1 C) (Oral)   Resp 16   Ht 5\' 7"  (1.702 m)   Wt 66.2 kg   LMP  (LMP Unknown)   SpO2 100%   BMI 22.87 kg/m   Physical Exam Vitals and nursing note reviewed.  Constitutional:       Comments: Audible expiratory wheeze noted on exam, somewhat tachypneic but not in frank distress  HENT:     Head: Normocephalic and atraumatic.  Eyes:     Conjunctiva/sclera: Conjunctivae normal.  Cardiovascular:     Rate and Rhythm: Normal rate and regular rhythm.     Heart sounds: No murmur.  Pulmonary:     Comments: Bilateral inspiratory and expiratory wheezes, prolonged expiratory phase, speaking in short phrases, mild to moderate tachypnea, not in frank distress, no hypoxia on room air Chest:     Chest wall: No tenderness or crepitus.  Abdominal:     Palpations: Abdomen is soft.     Tenderness: There is no abdominal tenderness.  Musculoskeletal:     Cervical back: Neck supple.  Skin:    General: Skin is warm and dry.  Neurological:     General: No focal deficit present.     Mental Status: She is alert and oriented to person, place, and time.  Psychiatric:        Mood and Affect: Mood normal.        Behavior: Behavior normal.     ED Results / Procedures / Treatments   Labs (all labs ordered are listed, but only abnormal results are displayed) Labs Reviewed  BASIC METABOLIC PANEL - Abnormal; Notable for the following components:      Result Value   Glucose, Bld 166 (*)    Creatinine, Ser 1.31 (*)    GFR calc non Af Amer 41 (*)    GFR calc Af Amer 48 (*)    All other components within normal limits  CBC - Abnormal; Notable for the following components:   RBC 3.64 (*)    Hemoglobin 11.5 (*)    HCT 35.4 (*)    All other components within normal limits  SARS CORONAVIRUS 2 BY RT PCR (HOSPITAL ORDER, [facility])  POC SARS CORONAVIRUS 2 AG -  ED  TROPONIN I (HIGH SENSITIVITY)  TROPONIN I (HIGH SENSITIVITY)    EKG EKG Interpretation  Date/Time:  Saturday Sep 13 2019 19:07:02 EDT Ventricular Rate:  75 PR Interval:  144 QRS Duration: 78 QT Interval:  384 QTC Calculation: 428 R Axis:   11 Text Interpretation: Normal sinus rhythm  Nonspecific T wave abnormality Abnormal ECG Confirmed by Madalyn Rob [phone]) on 09/13/2019 9:07:48 PM   Radiology DG Chest 2 View  Result Date: 09/13/2019 CLINICAL DATA:  Asthma, short of breath EXAM: CHEST - 2 VIEW COMPARISON:  12/02/2018 FINDINGS: The heart size and mediastinal contours are within normal limits. Both lungs are clear. The visualized skeletal structures are unremarkable. IMPRESSION: No active cardiopulmonary disease. Electronically Signed   By: Donavan Foil M.D.   On: 09/13/2019 19:50    Procedures Procedures (including critical care time)  Medications Ordered in ED Medications  ipratropium (ATROVENT HFA) inhaler 2 puff (2 puffs Inhalation Not Given 09/13/19 2133)  albuterol (PROVENTIL,VENTOLIN) solution continuous neb (10 mg/hr Nebulization New Bag/Given 09/13/19 2318)  ipratropium-albuterol (DUONEB) 0.5-2.5 (3) MG/3ML nebulizer solution 3 mL (3 mLs Nebulization Given 09/13/19 2242)  budesonide (PULMICORT) nebulizer solution 0.5 mg (0.5 mg Nebulization Given 09/13/19 2255)  magnesium sulfate IVPB 2 g 50 mL (has no administration in time range)  sodium chloride flush (NS) 0.9 % injection 3 mL (3 mLs Intravenous Given 09/13/19 2233)  albuterol (VENTOLIN HFA) 108 (90 Base) MCG/ACT inhaler 4 puff (4 puffs Inhalation Given 09/13/19 2129)  methylPREDNISolone sodium succinate (SOLU-MEDROL) 125 mg/2 mL injection 125 mg (125 mg Intravenous Given 09/13/19 2304)    ED Course  I have reviewed the triage  vital signs and the nursing notes.  Pertinent labs & imaging results that were available during my care of the patient were reviewed by me and considered in my medical decision making (see chart for details).    MDM Rules/Calculators/A&P                      70 year old lady who presents to ER with concern for shortness of breath.  On exam noted to have significant wheezing.  No hypoxia but did have some tachypnea and increased work of breathing.  Provided multiple breathing  treatments and patient had some modest relief but still having significant wheezing and shortness of breath.  Given patient's persistent symptoms despite these interventions, believe she would benefit from admission for further management of her asthma exacerbation.  Provided IV Solu-Medrol, IV magnesium in addition.  Consulted hospitalist for admit.    Final Clinical Impression(s) / ED Diagnoses Final diagnoses:  Shortness of breath  Exacerbation of asthma, unspecified asthma severity, unspecified whether persistent    Rx / DC Orders ED Discharge Orders    None       Lucrezia Starch, MD 09/13/19 2322

## 2019-09-13 NOTE — ED Triage Notes (Signed)
THE PT IS C/O SOB SINCE YESTERDAY   SHE HAS ASTHMA  FAINT WHEEZES HEARD  SHE HAS HAD HHN AND HER INHALER WITH LITTLE RELIEF  SHE ALSO SAW HER DOCTOR ON Tuesday WHO PLACED HER ON A PREDNISONE TAPER  SHE IS ON HER 7TH DAY OF PREDNISONE

## 2019-09-13 NOTE — ED Notes (Signed)
SARS Coronavirus 2 Ag "NEGATIVE" reported to Dr. Roslynn Amble

## 2019-09-14 ENCOUNTER — Other Ambulatory Visit: Payer: Self-pay

## 2019-09-14 DIAGNOSIS — I5032 Chronic diastolic (congestive) heart failure: Secondary | ICD-10-CM

## 2019-09-14 DIAGNOSIS — I10 Essential (primary) hypertension: Secondary | ICD-10-CM

## 2019-09-14 DIAGNOSIS — R062 Wheezing: Secondary | ICD-10-CM | POA: Diagnosis not present

## 2019-09-14 DIAGNOSIS — J45901 Unspecified asthma with (acute) exacerbation: Secondary | ICD-10-CM | POA: Diagnosis not present

## 2019-09-14 DIAGNOSIS — E119 Type 2 diabetes mellitus without complications: Secondary | ICD-10-CM | POA: Diagnosis not present

## 2019-09-14 DIAGNOSIS — R0602 Shortness of breath: Secondary | ICD-10-CM

## 2019-09-14 LAB — SARS CORONAVIRUS 2 BY RT PCR (HOSPITAL ORDER, PERFORMED IN ~~LOC~~ HOSPITAL LAB): SARS Coronavirus 2: NEGATIVE

## 2019-09-14 LAB — CBC
HCT: 35.2 % — ABNORMAL LOW (ref 36.0–46.0)
Hemoglobin: 11.1 g/dL — ABNORMAL LOW (ref 12.0–15.0)
MCH: 31.4 pg (ref 26.0–34.0)
MCHC: 31.5 g/dL (ref 30.0–36.0)
MCV: 99.7 fL (ref 80.0–100.0)
Platelets: 258 10*3/uL (ref 150–400)
RBC: 3.53 MIL/uL — ABNORMAL LOW (ref 3.87–5.11)
RDW: 14.3 % (ref 11.5–15.5)
WBC: 12.4 10*3/uL — ABNORMAL HIGH (ref 4.0–10.5)
nRBC: 0 % (ref 0.0–0.2)

## 2019-09-14 LAB — GLUCOSE, CAPILLARY
Glucose-Capillary: 265 mg/dL — ABNORMAL HIGH (ref 70–99)
Glucose-Capillary: 291 mg/dL — ABNORMAL HIGH (ref 70–99)
Glucose-Capillary: 223 mg/dL — ABNORMAL HIGH (ref 70–99)
Glucose-Capillary: 209 mg/dL — ABNORMAL HIGH (ref 70–99)
Glucose-Capillary: 137 mg/dL — ABNORMAL HIGH (ref 70–99)

## 2019-09-14 LAB — RESPIRATORY PANEL BY PCR

## 2019-09-14 LAB — BASIC METABOLIC PANEL
Anion gap: 19 — ABNORMAL HIGH (ref 5–15)
BUN: 23 mg/dL (ref 8–23)
CO2: 22 mmol/L (ref 22–32)
Calcium: 9.5 mg/dL (ref 8.9–10.3)
Chloride: 100 mmol/L (ref 98–111)
Creatinine, Ser: 1.5 mg/dL — ABNORMAL HIGH (ref 0.44–1.00)
GFR calc Af Amer: 40 mL/min — ABNORMAL LOW (ref >60)
GFR calc non Af Amer: 35 mL/min — ABNORMAL LOW (ref >60)
Glucose, Bld: 307 mg/dL — ABNORMAL HIGH (ref 70–99)
Potassium: 3.3 mmol/L — ABNORMAL LOW (ref 3.5–5.1)
Sodium: 141 mmol/L (ref 135–145)

## 2019-09-14 LAB — HEMOGLOBIN A1C
Hgb A1c MFr Bld: 6.5 % — ABNORMAL HIGH (ref 4.8–5.6)
Mean Plasma Glucose: 139.85 mg/dL

## 2019-09-14 LAB — HIV ANTIBODY (ROUTINE TESTING W REFLEX): HIV Screen 4th Generation wRfx: NONREACTIVE

## 2019-09-14 MED ORDER — LEVALBUTEROL HCL 0.63 MG/3ML IN NEBU
0.6300 mg | INHALATION_SOLUTION | Freq: Three times a day (TID) | RESPIRATORY_TRACT | Status: DC
  Administered 2019-09-14 – 2019-09-17 (×8): 0.63 mg via RESPIRATORY_TRACT
  Filled 2019-09-14 (×7): qty 3

## 2019-09-14 MED ORDER — INSULIN ASPART 100 UNIT/ML ~~LOC~~ SOLN
0.0000 [IU] | Freq: Every day | SUBCUTANEOUS | Status: DC
  Administered 2019-09-14 – 2019-09-15 (×2): 3 [IU] via SUBCUTANEOUS

## 2019-09-14 MED ORDER — INSULIN ASPART 100 UNIT/ML ~~LOC~~ SOLN
3.0000 [IU] | Freq: Three times a day (TID) | SUBCUTANEOUS | Status: DC
  Administered 2019-09-14 – 2019-09-17 (×10): 3 [IU] via SUBCUTANEOUS

## 2019-09-14 MED ORDER — INSULIN ASPART 100 UNIT/ML ~~LOC~~ SOLN
0.0000 [IU] | Freq: Three times a day (TID) | SUBCUTANEOUS | Status: DC
  Administered 2019-09-14 (×2): 5 [IU] via SUBCUTANEOUS
  Administered 2019-09-14: 8 [IU] via SUBCUTANEOUS
  Administered 2019-09-15: 3 [IU] via SUBCUTANEOUS
  Administered 2019-09-15 (×2): 2 [IU] via SUBCUTANEOUS
  Administered 2019-09-16: 5 [IU] via SUBCUTANEOUS
  Administered 2019-09-16: 8 [IU] via SUBCUTANEOUS
  Administered 2019-09-16 – 2019-09-17 (×2): 3 [IU] via SUBCUTANEOUS
  Administered 2019-09-17: 8 [IU] via SUBCUTANEOUS

## 2019-09-14 MED ORDER — ARFORMOTEROL TARTRATE 15 MCG/2ML IN NEBU
15.0000 ug | INHALATION_SOLUTION | Freq: Two times a day (BID) | RESPIRATORY_TRACT | Status: DC
  Filled 2019-09-14 (×2): qty 2

## 2019-09-14 MED ORDER — REVEFENACIN 175 MCG/3ML IN SOLN
175.0000 ug | Freq: Every day | RESPIRATORY_TRACT | Status: DC
  Administered 2019-09-15 – 2019-09-17 (×3): 175 ug via RESPIRATORY_TRACT
  Filled 2019-09-14 (×4): qty 3

## 2019-09-14 MED ORDER — MONTELUKAST SODIUM 10 MG PO TABS
10.0000 mg | ORAL_TABLET | Freq: Every day | ORAL | Status: DC
  Administered 2019-09-14 – 2019-09-16 (×3): 10 mg via ORAL
  Filled 2019-09-14 (×4): qty 1

## 2019-09-14 MED ORDER — PANTOPRAZOLE SODIUM 40 MG PO TBEC
40.0000 mg | DELAYED_RELEASE_TABLET | Freq: Every day | ORAL | Status: DC
  Administered 2019-09-14 – 2019-09-17 (×4): 40 mg via ORAL
  Filled 2019-09-14 (×4): qty 1

## 2019-09-14 MED ORDER — LEVALBUTEROL HCL 0.63 MG/3ML IN NEBU
0.6300 mg | INHALATION_SOLUTION | Freq: Three times a day (TID) | RESPIRATORY_TRACT | Status: DC
  Administered 2019-09-14: 0.63 mg via RESPIRATORY_TRACT
  Filled 2019-09-14: qty 3

## 2019-09-14 MED ORDER — ARFORMOTEROL TARTRATE 15 MCG/2ML IN NEBU
15.0000 ug | INHALATION_SOLUTION | Freq: Two times a day (BID) | RESPIRATORY_TRACT | Status: DC
  Administered 2019-09-14 – 2019-09-16 (×5): 15 ug via RESPIRATORY_TRACT
  Filled 2019-09-14 (×7): qty 2

## 2019-09-14 NOTE — Progress Notes (Signed)
PROGRESS NOTE  [name] [id number] DOB: 05-Jun-1950 DOA: 09/13/2019 PCP: [name], MD  HPI/Recap of past 24 hours: HPI from Dr [name] is a 69 y.o. female with medical history significant of chronic diastolic CHF, CAD, HTN, DM2. Pt with h/o asthma, though Dr. Melvyn Novas thinks it may be more of an upper airway cough syndrome based on his office note from 4 days ago.  He put her on prednisone taper. Pt presents to ED with severe SOB, wheezing, home nebs providing little relief. Symptoms were severe, constant, nothing makes better or worse. In the ED, given solumedrol, magnesium, nebs. CXR neg, COVID neg. Patient admitted for further management.   Today, patient denies any new complaints, still wheezing bilaterally, still with shortness of breath/cough, but denies its worsening, denies any chest pain, abdominal pain, nausea/vomiting, fever/chills.  Assessment/Plan: Principal Problem:   Wheezing Active Problems:   Essential hypertension   Diabetes mellitus type 2, controlled (HCC)   Chronic diastolic CHF (congestive heart failure) (HCC)  Possible acute asthma exacerbation/vocal cord dysfunction Currently saturating well on room air, with wheezing and SOB Currently afebrile, with leukocytosis (steroids) Chest x-ray unremarkable COVID-19 negative, respiratory viral panel pending Continue prednisone, duo nebs, Pulmicort nebulizer Flutter valve, cough suppressant, supplemental oxygen as needed Pulmonology consulted Monitor closely, telemetry, continuous pulse ox  Hypokalemia Replete as needed  AKI Baseline creatinine around 1.2 Daily BMP  Diabetes mellitus type 2 with hyperglycemia Likely worsened 2/2 steroid use Last A1c 6.5 SSI, NovoLog 3 times daily Accu-Cheks, hypoglycemic protocol Hold home Metformin  Chronic diastolic HF Appears euvolemic Last echo in 2019 showed EF of 65 to 70%, no regional wall motion abnormalities, grade 2 diastolic  dysfunction Continue to hold as needed torsemide, continue Bystolic  History of CAD Currently denies any chest pain Troponin WNL, EKG no acute ST changes Continue ASA, statins  Hypertension Continue diltiazem, hydralazine, Bystolic  Anxiety/depression Continue Xanax as needed       Malnutrition Type:      Malnutrition Characteristics:      Nutrition Interventions:       Estimated body mass index is 22.87 kg/m as calculated from the following:   Height as of this encounter: 5\' 7"  (1.702 m).   Weight as of this encounter: 66.2 kg.     Code Status: Full  Family Communication: Discussed extensively with patient  Disposition Plan: Status is: Observation  The patient remains OBS appropriate and will d/c before 2 midnights.  Dispo: The patient is from: Home              Anticipated d/c is to: Home              Anticipated d/c date is: 1 day              Patient currently is not medically stable to d/c., Patient still significantly wheezing with shortness of breath, requiring another day due to severity of illness     Consultants:  PCCM  Procedures:  None  Antimicrobials:  None  DVT prophylaxis: Lovenox   Objective: Vitals:   09/14/19 0221 09/14/19 0252 09/14/19 0515 09/14/19 0719  BP: (!) 149/80  136/70   Pulse: (!) 106 (!) 108 (!) 107   Resp: 19  17   Temp: 98.2 F (36.8 C)  97.7 F (36.5 C)   TempSrc: Oral  Oral   SpO2: 100%  100% 99%  Weight:      Height:  Intake/Output Summary (Last 24 hours) at 09/14/2019 1124 Last data filed at 09/14/2019 0724 Gross per 24 hour  Intake 650 ml  Output --  Net 650 ml   Filed Weights   09/13/19 1919  Weight: 66.2 kg    Exam:  General: NAD   Cardiovascular: S1, S2 present  Respiratory:  Diminished breath sounds, with significant bilateral wheezing  Abdomen: Soft, nontender, nondistended, bowel sounds present  Musculoskeletal: No bilateral pedal edema noted  Skin:  Normal  Psychiatry: Normal mood   Data Reviewed: CBC: Recent Labs  Lab 09/13/19 1920 09/14/19 0557  WBC 9.4 12.4*  HGB 11.5* 11.1*  HCT 35.4* 35.2*  MCV 97.3 99.7  PLT 280 0000000   Basic Metabolic Panel: Recent Labs  Lab 09/13/19 1920 09/14/19 0557  NA 142 141  K 4.2 3.3*  CL 102 100  CO2 26 22  GLUCOSE 166* 307*  BUN 23 23  CREATININE 1.31* 1.50*  CALCIUM 9.1 9.5   GFR: Estimated Creatinine Clearance: 33.9 mL/min (A) (by C-G formula based on SCr of 1.5 mg/dL (H)). Liver Function Tests: No results for input(s): AST, ALT, ALKPHOS, BILITOT, PROT, ALBUMIN in the last 168 hours. No results for input(s): LIPASE, AMYLASE in the last 168 hours. No results for input(s): AMMONIA in the last 168 hours. Coagulation Profile: No results for input(s): INR, PROTIME in the last 168 hours. Cardiac Enzymes: No results for input(s): CKTOTAL, CKMB, CKMBINDEX, TROPONINI in the last 168 hours. BNP (last 3 results) No results for input(s): PROBNP in the last 8760 hours. HbA1C: Recent Labs    09/14/19 0557  HGBA1C 6.5*   CBG: Recent Labs  Lab 09/14/19 0226 09/14/19 0628 09/14/19 1033  GLUCAP 265* 291* 223*   Lipid Profile: No results for input(s): CHOL, HDL, LDLCALC, TRIG, CHOLHDL, LDLDIRECT in the last 72 hours. Thyroid Function Tests: No results for input(s): TSH, T4TOTAL, FREET4, T3FREE, THYROIDAB in the last 72 hours. Anemia Panel: No results for input(s): VITAMINB12, FOLATE, FERRITIN, TIBC, IRON, RETICCTPCT in the last 72 hours. Urine analysis:    Component Value Date/Time   COLORURINE YELLOW 12/20/2017 0238   APPEARANCEUR CLEAR 12/20/2017 0238   LABSPEC 1.011 12/20/2017 0238   PHURINE 5.0 12/20/2017 0238   GLUCOSEU NEGATIVE 12/20/2017 0238   HGBUR NEGATIVE 12/20/2017 0238   BILIRUBINUR NEGATIVE 12/20/2017 0238   KETONESUR NEGATIVE 12/20/2017 0238   PROTEINUR NEGATIVE 12/20/2017 0238   UROBILINOGEN 0.2 08/07/2014 1304   NITRITE NEGATIVE 12/20/2017 0238    LEUKOCYTESUR NEGATIVE 12/20/2017 0238   Sepsis Labs: @LABRCNTIP (procalcitonin:4,lacticidven:4)  ) Recent Results (from the past 240 hour(s))  SARS Coronavirus 2 by RT PCR (hospital order, performed in [facility] hospital lab) Nasopharyngeal Nasopharyngeal Swab     Status: None   Collection Time: 09/14/19  1:44 AM   Specimen: Nasopharyngeal Swab  Result Value Ref Range Status   SARS Coronavirus 2 NEGATIVE NEGATIVE Final    Comment: (NOTE) SARS-CoV-2 target nucleic acids are NOT DETECTED. The SARS-CoV-2 RNA is generally detectable in upper and lower respiratory specimens during the acute phase of infection. The lowest concentration of SARS-CoV-2 viral copies this assay can detect is 250 copies / mL. A negative result does not preclude SARS-CoV-2 infection and should not be used as the sole basis for treatment or other patient management decisions.  A negative result may occur with improper specimen collection / handling, submission of specimen other than nasopharyngeal swab, presence of viral mutation(s) within the areas targeted by this assay, and inadequate number of viral copies (<250  copies / mL). A negative result must be combined with clinical observations, patient history, and epidemiological information. Fact Sheet for Patients:   StrictlyIdeas.no Fact Sheet for Healthcare Providers: BankingDealers.co.za This test is not yet approved or cleared  by the [place] FDA and has been authorized for detection and/or diagnosis of SARS-CoV-2 by FDA under an Emergency Use Authorization (EUA).  This EUA will remain in effect (meaning this test can be used) for the duration of the COVID-19 declaration under Section 564(b)(1) of the Act, 21 U.S.C. section 360bbb-3(b)(1), unless the authorization is terminated or revoked sooner. Performed at [facility], [place] [address], [place], [place] [address]       Studies: DG Chest 2  View  Result Date: 09/13/2019 CLINICAL DATA:  Asthma, short of breath EXAM: CHEST - 2 VIEW COMPARISON:  12/02/2018 FINDINGS: The heart size and mediastinal contours are within normal limits. Both lungs are clear. The visualized skeletal structures are unremarkable. IMPRESSION: No active cardiopulmonary disease. Electronically Signed   By: Donavan Foil M.D.   On: 09/13/2019 19:50    Scheduled Meds: . aspirin EC  81 mg Oral Daily  . atorvastatin  40 mg Oral QHS  . budesonide (PULMICORT) nebulizer solution  0.5 mg Nebulization BID  . diltiazem  240 mg Oral Daily  . divalproex  1,000 mg Oral QHS  . enoxaparin (LOVENOX) injection  40 mg Subcutaneous Q24H  . hydrALAZINE  25 mg Oral Q8H  . insulin aspart  0-15 Units Subcutaneous TID WC  . insulin aspart  0-5 Units Subcutaneous QHS  . levalbuterol  0.63 mg Nebulization Q8H  . memantine  10 mg Oral BID  . multivitamin with minerals  1 tablet Oral Daily  . nebivolol  10 mg Oral Daily  . potassium chloride SA  20 mEq Oral Daily  . predniSONE  40 mg Oral Q breakfast    Continuous Infusions:   LOS: 0 days     Alma Friendly, MD [facility]  If 7PM-7AM, please contact night-coverage [email or web address] 09/14/2019, 11:24 AM

## 2019-09-14 NOTE — Evaluation (Addendum)
Occupational Therapy Evaluation Patient Details Name: [name] MRN: [id number] DOB: 10-21-1950 Today's Date: 09/14/2019    History of Present Illness [name] is a 69 y.o. female.  Presents to the emergency department with chief complaint of shortness of breath. PMH includes asthma, CHF, CAD, depression, DM, NSTEMI, GERD.    Clinical Impression   PTA, pt was living at home with her husband, they are both retired, pt was independent with ADL/IADL and functional mobility. She enjoys gardening, pt was driving and completing her own grocery shopping. Pt currently reports feeling "much better than yesterday", no wheezing noted this session. Pt completed grooming at sink level with supervision, she required minguard without AD for higher level balance tasks. Pt reports feeling weaker in her BLE, pt would benefit from PT consult. Educated pt on diaphragmatic breathing, energy conservation strategies and use of incentive spirometer (pt able to consistently pull 750). Pt reports feeling close to baseline. Patient evaluated by Occupational Therapy with no further acute OT needs identified. All education has been completed and the patient has no further questions. See below for any follow-up Occupational Therapy or equipment needs. OT to sign off. Thank you for referral.       Follow Up Recommendations  No OT follow up;Supervision - Intermittent    Equipment Recommendations  None recommended by OT    Recommendations for Other Services PT consult     Precautions / Restrictions Precautions Precautions: Fall Precaution Comments: watch O2 Restrictions Weight Bearing Restrictions: No      Mobility Bed Mobility Overal bed mobility: Modified Independent                Transfers Overall transfer level: Needs assistance Equipment used: None Transfers: Sit to/from Stand Sit to Stand: Supervision         General transfer comment: supervision for safety    Balance Overall  balance assessment: Mild deficits observed, not formally tested                                         ADL either performed or assessed with clinical judgement   ADL Overall ADL's : Needs assistance/impaired Eating/Feeding: Modified independent   Grooming: Supervision/safety;Standing   Upper Body Bathing: Modified independent;Sitting   Lower Body Bathing: Set up;Sit to/from stand   Upper Body Dressing : Modified independent;Sitting   Lower Body Dressing: Supervision/safety;Sit to/from stand Lower Body Dressing Details (indicate cue type and reason): able to access bilateral feet Toilet Transfer: Min guard;Ambulation Toilet Transfer Details (indicate cue type and reason): minor instability noted with walking Toileting- Clothing Manipulation and Hygiene: Supervision/safety;Sit to/from stand   Tub/ Shower Transfer: Min guard;Walk-in Lobbyist Details (indicate cue type and reason): simulated Functional mobility during ADLs: Min guard General ADL Comments: minguard for more dynamic balance activities, pt demonstrated preference for intermittent single UE support     Vision         Perception     Praxis      Pertinent Vitals/Pain Pain Assessment: 0-10 Pain Score: 8  Pain Location: chest when pt coughs Pain Descriptors / Indicators: Constant;Dull Pain Intervention(s): Limited activity within patient's tolerance;Monitored during session     Hand Dominance Left   Extremity/Trunk Assessment Upper Extremity Assessment Upper Extremity Assessment: Overall WFL for tasks assessed   Lower Extremity Assessment Lower Extremity Assessment: Defer to PT evaluation;RLE deficits/detail;LLE deficits/detail RLE Deficits / Details: pt reports  feeling weaker in BLE;overall 4/5 LLE Deficits / Details: pt reports feeling weaker in BLE;overall 4/5   Cervical / Trunk Assessment Cervical / Trunk Assessment: Normal   Communication  Communication Communication: No difficulties   Cognition Arousal/Alertness: Awake/alert Behavior During Therapy: WFL for tasks assessed/performed Overall Cognitive Status: Within Functional Limits for tasks assessed                                     General Comments  unable to get reading of 02, no wheezing noted this session    Exercises Exercises: Other exercises Other Exercises Other Exercises: diaphragmatic breathing x10 Other Exercises: IS while seated upright pt consistently pulling 750 x 6;educated pt to use IS 10x/hour Other Exercises: educated pt on benefit of yoga and deep breathing exercises   Shoulder Instructions      Home Living Family/patient expects to be discharged to:: Private residence Living Arrangements: Spouse/significant other Available Help at Discharge: Family;Available 24 hours/day Type of Home: House Home Access: Level entry     Home Layout: One level     Bathroom Shower/Tub: Tub/shower unit;Walk-in shower   Bathroom Toilet: Handicapped height Bathroom Accessibility: Yes How Accessible: Accessible via walker Home Equipment: Walker - 2 wheels          Prior Functioning/Environment Level of Independence: Independent                 OT Problem List: Cardiopulmonary status limiting activity;Pain      OT Treatment/Interventions:      OT Goals(Current goals can be found in the care plan section) Acute Rehab OT Goals Patient Stated Goal: to go home OT Goal Formulation: With patient Time For Goal Achievement: 09/28/19 Potential to Achieve Goals: Good  OT Frequency:     Barriers to D/C:            Co-evaluation              AM-PAC OT "6 Clicks" Daily Activity     Outcome Measure Help from another person eating meals?: None Help from another person taking care of personal grooming?: A Little Help from another person toileting, which includes using toliet, bedpan, or urinal?: A Little Help from another  person bathing (including washing, rinsing, drying)?: A Little Help from another person to put on and taking off regular upper body clothing?: None Help from another person to put on and taking off regular lower body clothing?: A Little 6 Click Score: 20   End of Session Equipment Utilized During Treatment: Gait belt Nurse Communication: Mobility status  Activity Tolerance: Patient tolerated treatment well Patient left: in bed;with call bell/phone within reach  OT Visit Diagnosis: Other abnormalities of gait and mobility (R26.89);Pain Pain - part of body: (chest)                Time: [id number] OT Time Calculation (min): 17 min Charges:  OT General Charges $OT Visit: 1 Visit OT Evaluation $OT Eval Moderate Complexity: [place] OTR/L Acute Rehabilitation Services Office: [place] 09/14/2019, 9:20 AM

## 2019-09-14 NOTE — Evaluation (Signed)
Physical Therapy Evaluation Patient Details Name: [name] MRN: [id number] DOB: Aug 30, 1950 Today's Date: 09/14/2019   History of Present Illness  [name] is a 69 y.o. female.  Presents to the emergency department with chief complaint of shortness of breath. PMH includes asthma, CHF, CAD, depression, DM, NSTEMI, GERD.   Clinical Impression  PTA pt living with husband of 49 years in single story home. Pt reports independence in mobility and iADLs. Pt feels that her current episode of SoB is due to gardening with her mask off and increased pollen inhalation. Pt reports she did not have any asthma attacks with wearing her mask last year for COVID. Pt is currently limited in safe mobility by 4/4 DoE with ambulation over 100 feet. Pt is currently mod I for bed mobility, supervision for transfers and min guard for ambulation without AD. PT recommending HHPT for working on energy conservation and strengthening at discharge. PT will continue to follow acutely.      Follow Up Recommendations Home health PT    Equipment Recommendations  None recommended by PT       Precautions / Restrictions Precautions Precautions: Fall Precaution Comments: watch O2 Restrictions Weight Bearing Restrictions: No      Mobility  Bed Mobility Overal bed mobility: Modified Independent                Transfers Overall transfer level: Needs assistance Equipment used: None Transfers: Sit to/from Stand Sit to Stand: Supervision         General transfer comment: supervision for safety  Ambulation/Gait Ambulation/Gait assistance: Min guard Gait Distance (Feet): 150 Feet Assistive device: None Gait Pattern/deviations: Step-through pattern;WFL(Within Functional Limits) Gait velocity: slowed Gait velocity interpretation: 1.31 - 2.62 ft/sec, indicative of limited community ambulator General Gait Details: min guard for safety, with slow steady gait, requires standing rest break at 100 feet due  to 4/4 DoE, after approximately 2 minutes recovery pt able to ambulate back to room        Balance Overall balance assessment: Mild deficits observed, not formally tested                                           Pertinent Vitals/Pain Pain Assessment: No/denies pain Pain Score: 8  Pain Location: chest when pt coughs Pain Descriptors / Indicators: Constant;Dull Pain Intervention(s): Limited activity within patient's tolerance;Monitored during session    Home Living Family/patient expects to be discharged to:: Private residence Living Arrangements: Spouse/significant other Available Help at Discharge: Family;Available 24 hours/day Type of Home: House Home Access: Level entry     Home Layout: One level Home Equipment: Walker - 2 wheels      Prior Function Level of Independence: Independent               Hand Dominance   Dominant Hand: Left    Extremity/Trunk Assessment   Upper Extremity Assessment Upper Extremity Assessment: Overall WFL for tasks assessed    Lower Extremity Assessment Lower Extremity Assessment: RLE deficits/detail;LLE deficits/detail RLE Deficits / Details: ROM WFL, strength grossly4/5 LLE Deficits / Details: ROM WFL, strength grossly4/5    Cervical / Trunk Assessment Cervical / Trunk Assessment: Normal  Communication   Communication: No difficulties  Cognition Arousal/Alertness: Awake/alert Behavior During Therapy: WFL for tasks assessed/performed Overall Cognitive Status: Within Functional Limits for tasks assessed  General Comments General comments (skin integrity, edema, etc.): SaO2 >92%O2 on RA throughout session even when requiring standing rest break for 4/4 DoE, max HR 110bpm , encouraged pursed lipped breathing during recovery    Exercises Other Exercises Other Exercises: diaphragmatic breathing x10 Other Exercises: IS while seated upright pt  consistently pulling 750 x 6;educated pt to use IS 10x/hour Other Exercises: educated pt on benefit of yoga and deep breathing exercises   Assessment/Plan    PT Assessment Patient needs continued PT services  PT Problem List Decreased activity tolerance;Cardiopulmonary status limiting activity       PT Treatment Interventions Gait training;Stair training;Functional mobility training;Therapeutic activities;Therapeutic exercise;Balance training;Cognitive remediation;Patient/family education    PT Goals (Current goals can be found in the Care Plan section)  Acute Rehab PT Goals Patient Stated Goal: to go home PT Goal Formulation: With patient Time For Goal Achievement: 09/28/19 Potential to Achieve Goals: Good    Frequency Min 3X/week    AM-PAC PT "6 Clicks" Mobility  Outcome Measure Help needed turning from your back to your side while in a flat bed without using bedrails?: None Help needed moving from lying on your back to sitting on the side of a flat bed without using bedrails?: None Help needed moving to and from a bed to a chair (including a wheelchair)?: None Help needed standing up from a chair using your arms (e.g., wheelchair or bedside chair)?: None Help needed to walk in hospital room?: None Help needed climbing 3-5 steps with a railing? : A Little 6 Click Score: 23    End of Session Equipment Utilized During Treatment: Gait belt Activity Tolerance: Patient limited by fatigue Patient left: in bed;with call bell/phone within reach;Other (comment)(sitting EoB eating her lunch) Nurse Communication: Mobility status PT Visit Diagnosis: Difficulty in walking, not elsewhere classified (R26.2);Muscle weakness (generalized) (M62.81)    Time: [id number] PT Time Calculation (min) (ACUTE ONLY): 13 min   Charges:   PT Evaluation $PT Eval Moderate Complexity: 1 Mod          Zanita Millman B. Migdalia Dk PT, DPT Acute Rehabilitation Services Pager [phone] Office [phone]   [place] 09/14/2019, 11:41 AM

## 2019-09-14 NOTE — H&P (Addendum)
History and Physical    [name] [id number] DOB: 1950/07/08 DOA: 09/13/2019  PCP: [name], MD  Patient coming from: Home  I have personally briefly reviewed patient's old medical records in [place]  Chief Complaint: Wheezing  HPI: [name] is a 69 y.o. female with medical history significant of chronic diastolic CHF, CAD, HTN, DM2.  Pt with h/o asthma, though Dr. Melvyn Novas thinks it may be more of an upper airway cough syndrome based on his office note from 4 days ago.  He put her on prednisone taper.  Pt presents to ED with severe SOB, wheezing, home nebs providing little relief.  Symptoms are severe, constant, nothing makes better or worse.   ED Course: Given solumedrol, magnesium, nebs.  Symptoms persist.  CXR neg.  COVID rapid neg.   Review of Systems: As per HPI, otherwise all review of systems negative.  Past Medical History:  Diagnosis Date  . Anxiety   . Asthma   . Bronchitis   . Chronic diastolic CHF (congestive heart failure) ([place])   . Chronic diastolic CHF (congestive heart failure) ([place]) 07/17/2016  . Coronary artery disease    a. NSTEMI 12/2015 - LHC 01/18/16: s/p overlapping DESx2 to RCA, 10% ost-prox Cx,10% mLAD.  Marland Kitchen Depression   . Diabetes mellitus   . Dyslipidemia   . GERD (gastroesophageal reflux disease)   . Heart attack ([place])   . Hypertension   . Hypertensive heart disease   . NSTEMI (non-ST elevated myocardial infarction) ([place]) 12/2015  . Pneumonia     Past Surgical History:  Procedure Laterality Date  . ABDOMINAL HYSTERECTOMY    . CARDIAC CATHETERIZATION  1994   minimal LAD dz, no other dz, EF normal  . CARDIAC CATHETERIZATION N/A 01/18/2016   Procedure: Left Heart Cath and Coronary Angiography;  Surgeon: Burnell Blanks, MD;  Location: [facility];  Service: Cardiovascular;  Laterality: N/A;  . CARDIAC CATHETERIZATION N/A 01/18/2016   Procedure: Coronary Stent Intervention;  Surgeon: Burnell Blanks, MD;  Location: [facility];  Service: Cardiovascular;  Laterality: N/A;  . COLONOSCOPY WITH PROPOFOL N/A 04/09/2014   Procedure: COLONOSCOPY WITH PROPOFOL;  Surgeon: Cleotis Nipper, MD;  Location: [facility];  Service: Endoscopy;  Laterality: N/A;  . CORONARY STENT PLACEMENT  01/19/2016   STENT SYNERGY DES [id number] drug eluting stent was successfully placed, and overlaps the 2.5 x 38 mm Synergy stent placed distally.  . ESOPHAGOGASTRODUODENOSCOPY (EGD) WITH PROPOFOL N/A 04/09/2014   Procedure: ESOPHAGOGASTRODUODENOSCOPY (EGD) WITH PROPOFOL;  Surgeon: Cleotis Nipper, MD;  Location: [facility];  Service: Endoscopy;  Laterality: N/A;  . HEMORRHOID SURGERY    . HERNIA REPAIR    . KNEE ARTHROSCOPY       reports that she quit smoking about 15 years ago. Her smoking use included cigarettes. She has a 30.00 pack-year smoking history. She has never used smokeless tobacco. She reports that she does not drink alcohol or use drugs.  No Known Allergies  Family History  Problem Relation Age of Onset  . Allergies Mother   . Asthma Mother   . CAD Father 68  . Diabetes Father   . Hypertension Father   . Congestive Heart Failure Sister        died in her 43s  . CAD Sister 51     Prior to Admission medications   Medication Sig Start Date End Date Taking? Authorizing Provider  acetaminophen-codeine (TYLENOL #3) 300-30 MG tablet Take 1 tablet  by mouth every 6 (six) hours as needed for severe pain.  12/06/18  Yes [provider]  albuterol (PROAIR HFA) 108 (90 Base) MCG/ACT inhaler Inhale 2 puffs into the lungs every 4 (four) hours as needed for wheezing or shortness of breath. 04/08/19  Yes Tanda Rockers, MD  albuterol (PROVENTIL) (2.5 MG/3ML) 0.083% nebulizer solution Take 2.5 mg by nebulization every 4 (four) hours as needed for wheezing or shortness of breath.    Yes [provider]  ALPRAZolam (XANAX) 0.5 MG tablet Take 1 tablet (0.5 mg total) by mouth 2  (two) times daily as needed for anxiety. 07/31/15  Yes Theodis Blaze, MD  aspirin EC 81 MG tablet Take 81 mg by mouth daily.    Yes [provider]  atorvastatin (LIPITOR) 40 MG tablet Take 40 mg by mouth at bedtime.   Yes [provider]  Biotin 5000 MCG CAPS Take 1 capsule by mouth daily.   Yes [provider]  budesonide (PULMICORT) 0.5 MG/2ML nebulizer solution USE 2 ML IN NEBULIZER  ONCE DAILY Patient taking differently: Take 0.5 mg by nebulization daily.  08/14/19  Yes Tanda Rockers, MD  chlorpheniramine (CHLOR-TRIMETON) 4 MG tablet Take 4 mg by mouth 2 (two) times daily as needed for allergies or rhinitis.    Yes [provider]  dextromethorphan (DELSYM) 30 MG/5ML liquid Take 60 mg by mouth daily as needed for cough. 2 tsp every 12 hours as needed w/ flutter    Yes [provider]  diltiazem (CARTIA XT) 240 MG 24 hr capsule Take 1 capsule (240 mg total) by mouth daily. 02/05/19  Yes Jerline Pain, MD  divalproex (DEPAKOTE ER) 500 MG 24 hr tablet Take 2 tablets (1,000 mg total) by mouth at bedtime. 06/05/19  Yes Suzzanne Cloud, NP  esomeprazole (NEXIUM) 20 MG capsule Take 20 mg by mouth every morning.    Yes [provider]  formoterol (PERFOROMIST) 20 MCG/2ML nebulizer solution Take 2 mLs (20 mcg total) by nebulization every morning. Patient taking differently: Take 20 mcg by nebulization daily.  12/03/18  Yes Tanda Rockers, MD  hydrALAZINE (APRESOLINE) 25 MG tablet TAKE 1 TABLET BY MOUTH TWICE DAILY Patient taking differently: Take 25 mg by mouth in the morning and at bedtime.  02/05/19  Yes Jerline Pain, MD  memantine (NAMENDA) 10 MG tablet Take 1 tablet (10 mg total) by mouth 2 (two) times daily. 07/08/19  Yes Suzzanne Cloud, NP  metFORMIN (GLUCOPHAGE) 500 MG tablet Take 500 mg by mouth 2 (two) times daily with a meal.   Yes [provider]  Multiple Vitamin (MULTIVITAMIN WITH MINERALS) TABS tablet Take 1 tablet by mouth daily.    Yes [provider]  nebivolol (BYSTOLIC) 10 MG tablet Take 1 tablet (10 mg total) by mouth daily. 04/10/19  Yes Jerline Pain, MD  nitroGLYCERIN (NITROSTAT) 0.4 MG SL tablet DISSOLVE ONE TABLET UNDER THE TONGUE EVERY 5 MINUTES AS NEEDED FOR CHEST PAIN.  DO NOT EXCEED A TOTAL OF 3 DOSES IN 15 MINUTES Patient taking differently: Place 0.4 mg under the tongue every 5 (five) minutes as needed.  12/03/18  Yes Burtis Junes, NP  potassium chloride SA (K-DUR,KLOR-CON) 20 MEQ tablet Take 20 mEq by mouth daily.    Yes [provider]  predniSONE (DELTASONE) 10 MG tablet Take  4 each am x 2 days,   2 each am x 2 days,  1 each am x 2 days and  stop Patient taking differently: Take 10 mg by mouth See admin instructions. Start date 09/08/19.Takes  4 tablets by mouth for 2 days then 2 tablets for  2 days then 1 tablet for 2 days  and stop. 09/09/19  Yes Tanda Rockers, MD  torsemide (DEMADEX) 20 MG tablet Take 20 mg by mouth daily as needed (for swelling).  10/25/16  Yes [provider]  Respiratory Therapy Supplies (FLUTTER) DEVI by Does not apply route.    [provider]    Physical Exam: Vitals:   09/13/19 2049 09/13/19 2247 09/13/19 2255 09/13/19 2318  BP: (!) 190/85     Pulse: 64 (!) 104 96 96  Resp:      Temp:      TempSrc:      SpO2:  100% 100% 100%  Weight:      Height:        Constitutional: NAD, calm, comfortable Eyes: PERRL, lids and conjunctivae normal ENMT: Mucous membranes are moist. Posterior pharynx clear of any exudate or lesions.Normal dentition.  Neck: normal, supple, no masses, no thyromegaly Respiratory: Wheezing sounds in lung fields, however this is MUCH louder when auscultated over the vocal cords / anterior neck. Cardiovascular: Regular rate and rhythm, no murmurs / rubs / gallops. No extremity edema. 2+ pedal pulses. No carotid bruits.  Abdomen: no tenderness, no masses palpated. No hepatosplenomegaly. Bowel sounds positive.    Musculoskeletal: no clubbing / cyanosis. No joint deformity upper and lower extremities. Good ROM, no contractures. Normal muscle tone.  Skin: no rashes, lesions, ulcers. No induration Neurologic: CN 2-12 grossly intact. Sensation intact, DTR normal. Strength 5/5 in all 4.  Psychiatric: Normal judgment and insight. Alert and oriented x 3. Normal mood.    Labs on Admission: I have personally reviewed following labs and imaging studies  CBC: Recent Labs  Lab 09/13/19 1920  WBC 9.4  HGB 11.5*  HCT 35.4*  MCV 97.3  PLT 123456   Basic Metabolic Panel: Recent Labs  Lab 09/13/19 1920  NA 142  K 4.2  CL 102  CO2 26  GLUCOSE 166*  BUN 23  CREATININE 1.31*  CALCIUM 9.1   GFR: Estimated Creatinine Clearance: 38.9 mL/min (A) (by C-G formula based on SCr of 1.31 mg/dL (H)). Liver Function Tests: No results for input(s): AST, ALT, ALKPHOS, BILITOT, PROT, ALBUMIN in the last 168 hours. No results for input(s): LIPASE, AMYLASE in the last 168 hours. No results for input(s): AMMONIA in the last 168 hours. Coagulation Profile: No results for input(s): INR, PROTIME in the last 168 hours. Cardiac Enzymes: No results for input(s): CKTOTAL, CKMB, CKMBINDEX, TROPONINI in the last 168 hours. BNP (last 3 results) No results for input(s): PROBNP in the last 8760 hours. HbA1C: No results for input(s): HGBA1C in the last 72 hours. CBG: No results for input(s): GLUCAP in the last 168 hours. Lipid Profile: No results for input(s): CHOL, HDL, LDLCALC, TRIG, CHOLHDL, LDLDIRECT in the last 72 hours. Thyroid Function Tests: No results for input(s): TSH, T4TOTAL, FREET4, T3FREE, THYROIDAB in the last 72 hours. Anemia Panel: No results for input(s): VITAMINB12, FOLATE, FERRITIN, TIBC, IRON, RETICCTPCT in the last 72 hours. Urine analysis:    Component Value Date/Time   COLORURINE YELLOW 12/20/2017 [place] 12/20/2017 0238   LABSPEC 1.011 12/20/2017 0238   PHURINE 5.0 12/20/2017  0238   GLUCOSEU NEGATIVE 12/20/2017 0238   HGBUR NEGATIVE 12/20/2017 [place] NEGATIVE 12/20/2017 [place] 12/20/2017 0238  PROTEINUR NEGATIVE 12/20/2017 0238   UROBILINOGEN 0.2 08/07/2014 1304   NITRITE NEGATIVE 12/20/2017 0238   LEUKOCYTESUR NEGATIVE 12/20/2017 0238    Radiological Exams on Admission: DG Chest 2 View  Result Date: 09/13/2019 CLINICAL DATA:  Asthma, short of breath EXAM: CHEST - 2 VIEW COMPARISON:  12/02/2018 FINDINGS: The heart size and mediastinal contours are within normal limits. Both lungs are clear. The visualized skeletal structures are unremarkable. IMPRESSION: No active cardiopulmonary disease. Electronically Signed   By: Donavan Foil M.D.   On: 09/13/2019 19:50    EKG: Independently reviewed.  Assessment/Plan Principal Problem:   Wheezing Active Problems:   Essential hypertension   Diabetes mellitus type 2, controlled (HCC)   Chronic diastolic CHF (congestive heart failure) ([place])    1. Wheezing - I think more upper airway cough syndrome / VCD than asthma, but not sure how much a role asthma is playing and I cant risk missing treating a severe asthma exacerbation which could be deadly. 1. Therefore admitting to hospital and doing full asthma treatment for the moment: 1. COPD pathway 2. Steroids 3. Adult wheeze protocol 4. Cont home scheduled nebs 5. PRN albuterol 6. Cont pulse ox and tele monitor 7. Got magnesium in ED 2. Trial of BIPAP if pt gets worse since this may help VCD 3. Pulm consult in AM 4. Discussed the above impression and plan with patient and did reassure pt and family. 2. HTN - 1. Cont home BP meds 2. Add PRN hydralazine if needed 3. DM2 - 1. Hold metformin 2. Mod scale SSI AC/HS 4. CHF - 1. Cont home meds 2. Takes torsemide at home PRN only  DVT prophylaxis: Lovenox Code Status: Full Family Communication: Family at bedside Disposition Plan: Home after admit Consults called: Curbsided Dr.  Oletta Darter, call pulm for consult in AM Admission status: Place in obs     Yann Biehn, [facility]  How to contact the [facility] Attending or Consulting provider [place] or covering provider during after hours [place], for this patient?  1. Check the care team in [facility] and look for a) attending/consulting [facility] provider listed and b) the [facility] team listed 2. Log into [email or web address]  Amion Physician Scheduling and messaging for groups and whole hospitals  On call and physician scheduling software for group practices, residents, hospitalists and other medical providers for call, clinic, rotation and shift schedules. On[employer] is a hospital-wide system for scheduling doctors and paging doctors on call. EasyPlot is for scientific plotting and data analysis.  [email or web address]  and use [place]'s universal password to access. If you do not have the password, please contact the hospital operator.  3. Locate the [facility] provider you are looking for under [facility] and page to a number that you can be directly reached. 4. If you still have difficulty reaching the provider, please page the [facility] (Director on Call) for the Hospitalists listed on amion for assistance.  09/14/2019, 12:04 AM

## 2019-09-14 NOTE — Consult Note (Signed)
NAME:  [name], MRN:  [id number], DOB:  Jan 14, 1951, LOS: 0 ADMISSION DATE:  09/13/2019, CONSULTATION DATE:  09/14/2019 REFERRING MD:  Dr. Horris Latino, CHIEF COMPLAINT:  wheeze   Brief History   69 yo FM, h/o asthma, UA cough syndrome, chronic diastolic hf, DMII, GERD, HTN. She is managed by Dr. Melvyn Novas for asthma and UACS.   History of present illness   69 yo FM, h/o asthma, UA cough syndrome, chronic diastolic hf, DMII, GERD, HTN. She is managed by Dr. Melvyn Novas for asthma and UACS. She was last seen in the office on 09/09/2019. The patient states that she was just outside and digging her garden. She was exposed to a lot of pollen. She felt like she started to wheeze and her chest was tight. She has been using her albuterol inhaler. Per her med list she is managed with pulmicort neb at home. fetl thtoughout the years treated for more of an upper airway issue. She has been on several inhalers in the past dulera and symbicort. 2017 had neg EOS, Ige 87 and positive RAST to grass and trees.   Here today with increased WOB, bilateral wheezing and chest tightness.   Past Medical History   Past Medical History:  Diagnosis Date  . Anxiety   . Asthma   . Bronchitis   . Chronic diastolic CHF (congestive heart failure) ([place])   . Chronic diastolic CHF (congestive heart failure) ([place]) 07/17/2016  . Coronary artery disease    a. NSTEMI 12/2015 - LHC 01/18/16: s/p overlapping DESx2 to RCA, 10% ost-prox Cx,10% mLAD.  Marland Kitchen Depression   . Diabetes mellitus   . Dyslipidemia   . GERD (gastroesophageal reflux disease)   . Heart attack ([place])   . Hypertension   . Hypertensive heart disease   . NSTEMI (non-ST elevated myocardial infarction) ([place]) 12/2015  . Pneumonia      Significant Hospital Events     Consults:  PCCM  Procedures:    Significant Diagnostic Tests:    Micro Data:  RVP neg  Antimicrobials:  None    Interim history/subjective:  Please see HPI above   Objective   Blood pressure  136/70, pulse (!) 107, temperature 97.7 F (36.5 C), temperature source Oral, resp. rate 17, height 5\' 7"  (1.702 m), weight 66.2 kg, SpO2 99 %.        Intake/Output Summary (Last 24 hours) at 09/14/2019 1145 Last data filed at 09/14/2019 0724 Gross per 24 hour  Intake 650 ml  Output --  Net 650 ml   Filed Weights   09/13/19 1919  Weight: 66.2 kg    Examination: General: elderly fm, resting in bed, labored  HENT: tracking appropriately, alert, NCAT Lungs: BL wheezing  Cardiovascular: RRR, s1 s2  Abdomen: soft, nt nd  Extremities: no edema  Neuro: alert, followign commands no deficit  GU: deferred   PFT Results Latest Ref Rng & Units 04/18/2018  FVC-Pre L 1.19  FVC-Predicted Pre % 55  FVC-Post L 1.25  FVC-Predicted Post % 58  Pre FEV1/FVC % % 89  Post FEV1/FCV % % 91  FEV1-Pre L 1.05  FEV1-Predicted Pre % 64  FEV1-Post L 1.13  DLCO UNC% % 53  DLCO COR %Predicted % 147  TLC L 2.73  TLC % Predicted % 58  RV % Predicted % 64    PFTs reviewed CXR reviewed: clear no infiltrate. The patient's images have been independently reviewed by me.    Resolved Hospital Problem list     Assessment &  Plan:   Acute Exacerbation of Asthma, she has a flare of small airway disease, tight, minimal air movement and bilateral expiratory wheezing today on exam.  Prior IgE 87, Prior positive RAST  UACS Hyperglycemia from steroids  - agree with steroids, pay benefit from slow taper at D/c  - scheduled nebs, ICS, LABA, LAMA, orders placed  - started singulair  - would discharge on maintence inhaler until we can get seen in office for follow up - will help arrange this follow up.  We appreciate the consult.    Labs    2017 RAST: Allergen, D pternoyssinus,d7 kU/L 0.17High    D. farinae kU/L 0.11High    Cat Dander kU/L <0.10   Dog Dander kU/L <0.10   [place] Grass kU/L 0.89High    Timothy Grass kU/L 3.93High    [place] Grass kU/L 0.78High    Cockroach kU/L 0.20High    Allergen,  P. notatum, m1 kU/L <0.10   Allergen, C. Herbarum, M2 kU/L <0.10   Aspergillus fumigatus, m3 kU/L <0.10   Allergen, A. alternata, m6 kU/L <0.10   [place] IgE kU/L 0.31High    Allergen, Comm Silver Wendee Copp, t9 kU/L 0.33High    Allergen, Cedar tree, t12 kU/L 0.36High    Allergen, Oak,t7 kU/L 0.25High    Elm IgE kU/L 0.27High    Allergen, Cottonwood, t14 kU/L 0.22High    Pecan/Hickory Tree IgE kU/L 1.68High    Allergen, Mulberry, t76 kU/L 0.18High    Common Ragweed kU/L 0.39High    Rough Pigweed IgE kU/L 0.22High    Sheep Sorrel IgE kU/L 0.23High    IgE (Immunoglobulin E), Serum <115 kU/L 87   Allergen, Mouse Urine Protein, e78 kU/L <0.10      CBC: Recent Labs  Lab 09/13/19 1920 09/14/19 0557  WBC 9.4 12.4*  HGB 11.5* 11.1*  HCT 35.4* 35.2*  MCV 97.3 99.7  PLT 280 0000000    Basic Metabolic Panel: Recent Labs  Lab 09/13/19 1920 09/14/19 0557  NA 142 141  K 4.2 3.3*  CL 102 100  CO2 26 22  GLUCOSE 166* 307*  BUN 23 23  CREATININE 1.31* 1.50*  CALCIUM 9.1 9.5   GFR: Estimated Creatinine Clearance: 33.9 mL/min (A) (by C-G formula based on SCr of 1.5 mg/dL (H)). Recent Labs  Lab 09/13/19 1920 09/14/19 0557  WBC 9.4 12.4*    Liver Function Tests: No results for input(s): AST, ALT, ALKPHOS, BILITOT, PROT, ALBUMIN in the last 168 hours. No results for input(s): LIPASE, AMYLASE in the last 168 hours. No results for input(s): AMMONIA in the last 168 hours.  ABG    Component Value Date/Time   PHART 7.382 08/12/2017 0550   PCO2ART 45.0 08/12/2017 0550   PO2ART 163.0 (H) 08/12/2017 0550   HCO3 28.6 (H) 03/15/2018 1100   TCO2 30 03/15/2018 1100   ACIDBASEDEF 1.0 12/19/2017 1820   O2SAT 71.0 03/15/2018 1100     Coagulation Profile: No results for input(s): INR, PROTIME in the last 168 hours.  Cardiac Enzymes: No results for input(s): CKTOTAL, CKMB, CKMBINDEX, TROPONINI in the last 168 hours.  HbA1C: Hgb A1c MFr Bld  Date/Time Value Ref Range Status    09/14/2019 05:57 AM 6.5 (H) 4.8 - 5.6 % Final    Comment:    (NOTE) Pre diabetes:          5.7%-6.4% Diabetes:              >6.4% Glycemic control for   <7.0% adults with diabetes  10/06/2017 02:53 AM 7.3 (H) 4.8 - 5.6 % Final    Comment:    (NOTE) Pre diabetes:          5.7%-6.4% Diabetes:              >6.4% Glycemic control for   <7.0% adults with diabetes     CBG: Recent Labs  Lab 09/14/19 0226 09/14/19 0628 09/14/19 1033  GLUCAP 265* 291* 223*    Review of Systems:   Review of Systems  Constitutional: Negative for chills, fever, malaise/fatigue and weight loss.  HENT: Negative for hearing loss, sore throat and tinnitus.   Eyes: Negative for blurred vision and double vision.  Respiratory: Positive for cough, shortness of breath and wheezing. Negative for hemoptysis, sputum production and stridor.   Cardiovascular: Negative for chest pain, palpitations, orthopnea, leg swelling and PND.  Gastrointestinal: Negative for abdominal pain, constipation, diarrhea, heartburn, nausea and vomiting.  Genitourinary: Negative for dysuria, hematuria and urgency.  Musculoskeletal: Negative for joint pain and myalgias.  Skin: Negative for itching and rash.  Neurological: Negative for dizziness, tingling, weakness and headaches.  Endo/Heme/Allergies: Negative for environmental allergies. Does not bruise/bleed easily.  Psychiatric/Behavioral: Negative for depression. The patient is not nervous/anxious and does not have insomnia.   All other systems reviewed and are negative.    Past Medical History  She,  has a past medical history of Anxiety, Asthma, Bronchitis, Chronic diastolic CHF (congestive heart failure) ([place]), Chronic diastolic CHF (congestive heart failure) ([place]) (07/17/2016), Coronary artery disease, Depression, Diabetes mellitus, Dyslipidemia, GERD (gastroesophageal reflux disease), Heart attack ([place]), Hypertension, Hypertensive heart disease, NSTEMI (non-ST elevated  myocardial infarction) ([place]) (12/2015), and Pneumonia.   Surgical History    Past Surgical History:  Procedure Laterality Date  . ABDOMINAL HYSTERECTOMY    . CARDIAC CATHETERIZATION  1994   minimal LAD dz, no other dz, EF normal  . CARDIAC CATHETERIZATION N/A 01/18/2016   Procedure: Left Heart Cath and Coronary Angiography;  Surgeon: Burnell Blanks, MD;  Location: [facility];  Service: Cardiovascular;  Laterality: N/A;  . CARDIAC CATHETERIZATION N/A 01/18/2016   Procedure: Coronary Stent Intervention;  Surgeon: Burnell Blanks, MD;  Location: [facility];  Service: Cardiovascular;  Laterality: N/A;  . COLONOSCOPY WITH PROPOFOL N/A 04/09/2014   Procedure: COLONOSCOPY WITH PROPOFOL;  Surgeon: Cleotis Nipper, MD;  Location: [facility];  Service: Endoscopy;  Laterality: N/A;  . CORONARY STENT PLACEMENT  01/19/2016   STENT SYNERGY DES [id number] drug eluting stent was successfully placed, and overlaps the 2.5 x 38 mm Synergy stent placed distally.  . ESOPHAGOGASTRODUODENOSCOPY (EGD) WITH PROPOFOL N/A 04/09/2014   Procedure: ESOPHAGOGASTRODUODENOSCOPY (EGD) WITH PROPOFOL;  Surgeon: Cleotis Nipper, MD;  Location: [facility];  Service: Endoscopy;  Laterality: N/A;  . HEMORRHOID SURGERY    . HERNIA REPAIR    . KNEE ARTHROSCOPY       Social History   reports that she quit smoking about 15 years ago. Her smoking use included cigarettes. She has a 30.00 pack-year smoking history. She has never used smokeless tobacco. She reports that she does not drink alcohol or use drugs.   Family History   Her family history includes Allergies in her mother; Asthma in her mother; CAD (age of onset: 87) in her father; CAD (age of onset: 57) in her sister; Congestive Heart Failure in her sister; Diabetes in her father; Hypertension in her father.   Allergies No Known Allergies   Home Medications  Prior to Admission medications  Medication Sig Start Date End Date Taking?  Authorizing Provider  acetaminophen-codeine (TYLENOL #3) 300-30 MG tablet Take 1 tablet by mouth every 6 (six) hours as needed for severe pain.  12/06/18  Yes [provider]  albuterol (PROAIR HFA) 108 (90 Base) MCG/ACT inhaler Inhale 2 puffs into the lungs every 4 (four) hours as needed for wheezing or shortness of breath. 04/08/19  Yes Tanda Rockers, MD  albuterol (PROVENTIL) (2.5 MG/3ML) 0.083% nebulizer solution Take 2.5 mg by nebulization every 4 (four) hours as needed for wheezing or shortness of breath.    Yes [provider]  ALPRAZolam (XANAX) 0.5 MG tablet Take 1 tablet (0.5 mg total) by mouth 2 (two) times daily as needed for anxiety. 07/31/15  Yes Theodis Blaze, MD  aspirin EC 81 MG tablet Take 81 mg by mouth daily.    Yes [provider]  atorvastatin (LIPITOR) 40 MG tablet Take 40 mg by mouth at bedtime.   Yes [provider]  Biotin 5000 MCG CAPS Take 1 capsule by mouth daily.   Yes [provider]  budesonide (PULMICORT) 0.5 MG/2ML nebulizer solution USE 2 ML IN NEBULIZER  ONCE DAILY Patient taking differently: Take 0.5 mg by nebulization daily.  08/14/19  Yes Tanda Rockers, MD  chlorpheniramine (CHLOR-TRIMETON) 4 MG tablet Take 4 mg by mouth 2 (two) times daily as needed for allergies or rhinitis.    Yes [provider]  dextromethorphan (DELSYM) 30 MG/5ML liquid Take 60 mg by mouth daily as needed for cough. 2 tsp every 12 hours as needed w/ flutter    Yes [provider]  diltiazem (CARTIA XT) 240 MG 24 hr capsule Take 1 capsule (240 mg total) by mouth daily. 02/05/19  Yes Jerline Pain, MD  divalproex (DEPAKOTE ER) 500 MG 24 hr tablet Take 2 tablets (1,000 mg total) by mouth at bedtime. 06/05/19  Yes Suzzanne Cloud, NP  esomeprazole (NEXIUM) 20 MG capsule Take 20 mg by mouth every morning.    Yes [provider]  formoterol (PERFOROMIST) 20 MCG/2ML nebulizer solution Take 2 mLs (20 mcg total) by nebulization  every morning. Patient taking differently: Take 20 mcg by nebulization daily.  12/03/18  Yes Tanda Rockers, MD  hydrALAZINE (APRESOLINE) 25 MG tablet TAKE 1 TABLET BY MOUTH TWICE DAILY Patient taking differently: Take 25 mg by mouth in the morning and at bedtime.  02/05/19  Yes Jerline Pain, MD  memantine (NAMENDA) 10 MG tablet Take 1 tablet (10 mg total) by mouth 2 (two) times daily. 07/08/19  Yes Suzzanne Cloud, NP  metFORMIN (GLUCOPHAGE) 500 MG tablet Take 500 mg by mouth 2 (two) times daily with a meal.   Yes [provider]  Multiple Vitamin (MULTIVITAMIN WITH MINERALS) TABS tablet Take 1 tablet by mouth daily.   Yes [provider]  nebivolol (BYSTOLIC) 10 MG tablet Take 1 tablet (10 mg total) by mouth daily. 04/10/19  Yes Jerline Pain, MD  nitroGLYCERIN (NITROSTAT) 0.4 MG SL tablet DISSOLVE ONE TABLET UNDER THE TONGUE EVERY 5 MINUTES AS NEEDED FOR CHEST PAIN.  DO NOT EXCEED A TOTAL OF 3 DOSES IN 15 MINUTES Patient taking differently: Place 0.4 mg under the tongue every 5 (five) minutes as needed.  12/03/18  Yes Burtis Junes, NP  potassium chloride SA (K-DUR,KLOR-CON) 20 MEQ tablet Take 20 mEq by mouth daily.    Yes [provider]  predniSONE (DELTASONE) 10 MG tablet Take  4 each am  x 2 days,   2 each am x 2 days,  1 each am x 2 days and stop Patient taking differently: Take 10 mg by mouth See admin instructions. Start date 09/08/19.Takes  4 tablets by mouth for 2 days then 2 tablets for  2 days then 1 tablet for 2 days  and stop. 09/09/19  Yes Tanda Rockers, MD  torsemide (DEMADEX) 20 MG tablet Take 20 mg by mouth daily as needed (for swelling).  10/25/16  Yes [provider]  Respiratory Therapy Supplies (FLUTTER) DEVI by Does not apply route.    [provider]     Garner Nash, DO [facility] 09/14/2019 11:57 AM

## 2019-09-15 DIAGNOSIS — B348 Other viral infections of unspecified site: Secondary | ICD-10-CM

## 2019-09-15 DIAGNOSIS — I10 Essential (primary) hypertension: Secondary | ICD-10-CM | POA: Diagnosis not present

## 2019-09-15 DIAGNOSIS — J45901 Unspecified asthma with (acute) exacerbation: Secondary | ICD-10-CM | POA: Diagnosis not present

## 2019-09-15 DIAGNOSIS — E119 Type 2 diabetes mellitus without complications: Secondary | ICD-10-CM | POA: Diagnosis not present

## 2019-09-15 DIAGNOSIS — R05 Cough: Secondary | ICD-10-CM

## 2019-09-15 DIAGNOSIS — I5032 Chronic diastolic (congestive) heart failure: Secondary | ICD-10-CM | POA: Diagnosis not present

## 2019-09-15 LAB — GLUCOSE, CAPILLARY
Glucose-Capillary: 133 mg/dL — ABNORMAL HIGH (ref 70–99)
Glucose-Capillary: 161 mg/dL — ABNORMAL HIGH (ref 70–99)
Glucose-Capillary: 198 mg/dL — ABNORMAL HIGH (ref 70–99)
Glucose-Capillary: 275 mg/dL — ABNORMAL HIGH (ref 70–99)

## 2019-09-15 LAB — CBC WITH DIFFERENTIAL/PLATELET
Abs Immature Granulocytes: 0.21 10*3/uL — ABNORMAL HIGH (ref 0.00–0.07)
Basophils Absolute: 0 10*3/uL (ref 0.0–0.1)
Basophils Relative: 0 %
Eosinophils Absolute: 0 10*3/uL (ref 0.0–0.5)
Eosinophils Relative: 0 %
HCT: 32 % — ABNORMAL LOW (ref 36.0–46.0)
Hemoglobin: 10.5 g/dL — ABNORMAL LOW (ref 12.0–15.0)
Immature Granulocytes: 1 %
Lymphocytes Relative: 12 %
Lymphs Abs: 1.7 10*3/uL (ref 0.7–4.0)
MCH: 31.6 pg (ref 26.0–34.0)
MCHC: 32.8 g/dL (ref 30.0–36.0)
MCV: 96.4 fL (ref 80.0–100.0)
Monocytes Absolute: 1.2 10*3/uL — ABNORMAL HIGH (ref 0.1–1.0)
Monocytes Relative: 8 %
Neutro Abs: 11.4 10*3/uL — ABNORMAL HIGH (ref 1.7–7.7)
Neutrophils Relative %: 79 %
Platelets: 242 10*3/uL (ref 150–400)
RBC: 3.32 MIL/uL — ABNORMAL LOW (ref 3.87–5.11)
RDW: 13.9 % (ref 11.5–15.5)
WBC: 14.6 10*3/uL — ABNORMAL HIGH (ref 4.0–10.5)
nRBC: 0.1 % (ref 0.0–0.2)

## 2019-09-15 LAB — BASIC METABOLIC PANEL
Anion gap: 9 (ref 5–15)
BUN: 23 mg/dL (ref 8–23)
CO2: 25 mmol/L (ref 22–32)
Calcium: 9.3 mg/dL (ref 8.9–10.3)
Chloride: 103 mmol/L (ref 98–111)
Creatinine, Ser: 1.19 mg/dL — ABNORMAL HIGH (ref 0.44–1.00)
GFR calc Af Amer: 54 mL/min — ABNORMAL LOW (ref >60)
GFR calc non Af Amer: 46 mL/min — ABNORMAL LOW (ref >60)
Glucose, Bld: 204 mg/dL — ABNORMAL HIGH (ref 70–99)
Potassium: 4 mmol/L (ref 3.5–5.1)
Sodium: 137 mmol/L (ref 135–145)

## 2019-09-15 MED ORDER — METHYLPREDNISOLONE SODIUM SUCC 125 MG IJ SOLR
80.0000 mg | Freq: Two times a day (BID) | INTRAMUSCULAR | Status: DC
  Administered 2019-09-15 – 2019-09-17 (×4): 80 mg via INTRAVENOUS
  Filled 2019-09-15 (×4): qty 2

## 2019-09-15 NOTE — Progress Notes (Signed)
Initial Nutrition Assessment  RD working remotely.  DOCUMENTATION CODES:   Not applicable  INTERVENTION:   -Continue MVI with minerals daily -Magic cup TID with meals, each supplement provides 290 kcal and 9 grams of protein  NUTRITION DIAGNOSIS:   Increased nutrient needs related to chronic illness(CHF) as evidenced by estimated needs.  GOAL:   Patient will meet greater than or equal to 90% of their needs  MONITOR:   PO intake, Supplement acceptance, Labs, Weight trends, Skin, I & O's  REASON FOR ASSESSMENT:   Consult Assessment of nutrition requirement/status  ASSESSMENT:   Jacqueline JANUARY is a 69 y.o. female with medical history significant of chronic diastolic CHF, CAD, HTN, DM2.  Pt admitted with asthma exacerbation.   Reviewed I/O's: +360 ml x 24 hours and +770 ml since admission  Attempted to speak with pt via phone, however, no answer.   Pt with good appetite; noted meal completion 80-100%.   Reviewed wt hx; pt has experienced a 7% of wt loss over the past 6 months, which is not significant for time frame.   Medications reviewed and include cardizem and prednisone.   Lab Results  Component Value Date   HGBA1C 6.5 (H) 09/14/2019   PTA DM medications are 500 mg metformin daily. Per ADA's Standards of Medical Care for Diabetes, glycemic targets for elderly patients who are otherwise healthy (few medical impairments) and cognitively intact should be less stringent (Hgb A1c <7.5).   Labs reviewed: CBGS: 133-209 (inpatient orders for glycemic control are 0-15 units insulin aspart TID with meals, 0-5 units insulin aspart q HS, and 3 units inuslin aspart TID with meals).   Diet Order:   Diet Order            Diet Carb Modified Fluid consistency: Thin; Room service appropriate? Yes  Diet effective now              EDUCATION NEEDS:   No education needs have been identified at this time  Skin:  Skin Assessment: Reviewed RN Assessment  Last BM:   09/13/19  Height:   Ht Readings from Last 1 Encounters:  09/13/19 5\' 7"  (1.702 m)    Weight:   Wt Readings from Last 1 Encounters:  09/13/19 66.2 kg    Ideal Body Weight:  61.4 kg  BMI:  Body mass index is 22.87 kg/m.  Estimated Nutritional Needs:   Kcal:  [id number]  Protein:  90-105 grams  Fluid:  > 1.7 L    Loistine Chance, RD, LDN, [place] Registered Dietitian II Certified Diabetes Care and Education Specialist Please refer to [facility] for RD and/or RD on-call/weekend/after hours pager

## 2019-09-15 NOTE — Progress Notes (Signed)
Flutter valve given to patient per MD order.  Patient performed well, with good effort.

## 2019-09-15 NOTE — Progress Notes (Signed)
Progress Note    [name]  [id number] DOB: 09/24/1950  DOA: 09/13/2019 PCP: [name], MD    Brief Narrative:    Medical records reviewed and are as summarized below:  [name] is an 69 y.o. female with a past medical history that includes chronic diastolic heart failure, diabetes type 2, hypertension, GERD, UA cough syndrome, asthma admitted May 16 with respiratory distress related to asthma exacerbation likely triggered by seasonal allergies.  Provided with scheduled nebs prednisone oxygen supplementation.  Patient remains quite wheezy with decreased breath sounds.  Assessment/Plan:   Principal Problem:   Asthma exacerbation Active Problems:   Essential hypertension   Severe persistent asthma without complication   Diabetes mellitus type 2, controlled (HCC)   Chronic diastolic CHF (congestive heart failure) (HCC)   Upper airway cough syndrome with VCD   #1.  Asthma exacerbation.  Patient with a history of severe persistent asthma without complication and UA cough syndrome.  X-ray with no active cardiopulmonary disease.  Oxygen saturation level greater than 90% on room air.  Chart review indicates was evaluated by pulmonology in the outpatient setting 4 days ago.  Note indicates pulmonology opined likely more of an upper airway cough syndrome and prednisone taper was started.  Presented more like upper airway cough syndrome than asthma but she does continue to wheeze throughout both lung fields with diminished breath sounds.  She is receiving scheduled nebs and prednisone. -We will change prednisone to Solu-Medrol -Flutter valve -Continue scheduled nebs -As needed albuterol -Monitor oxygen saturation level -Appreciate pulmonology consult  #2.  Hypertension.  Fair control. -Continue home meds -As needed hydralazine  #3.  Diabetes type 2.  Home medications include Metformin.  Anticipate an elevation in her blood sugar given the use of steroids -Hold  Metformin for now -Sliding scale insulin for optimal control.  Adjust as indicated -Meal coverage as appetite is good  #4.  CHF.  Compensated.  Echo done 2019 revealed an EF of 65 to 70% with normal wall motion grade 2 diastolic dysfunction.  Home medications include Demadex as needed -Daily weights -Intake and output   Family Communication/Anticipated D/C date and plan/Code Status   DVT prophylaxis: Lovenox ordered. Code Status: Full Code.  Family Communication: pateitn Disposition Plan: Status is: Observation  The patient remains OBS appropriate and will d/c before 2 midnights.  Dispo: The patient is from: Home              Anticipated d/c is to: Home              Anticipated d/c date is: 1 day              Patient currently is not medically stable to d/c.         Medical Consultants:    pulmonology   Anti-Infectives:    None  Subjective:   In bed with head of bed up resting quietly.  Denies pain or discomfort.  States that her breathing is "better than it was when I came in" but not back to baseline.  Reported she ambulated yesterday in the hallway and had to stop and sit down due to worsening shortness of breath  Objective:    Vitals:   09/15/19 0828 09/15/19 0836 09/15/19 0837 09/15/19 1229  BP:    (!) 163/77  Pulse:    82  Resp:    18  Temp:    98 F (36.7 C)  TempSrc:    Oral  SpO2: 100% 100% 100% 98%  Weight:      Height:        Intake/Output Summary (Last 24 hours) at 09/15/2019 1332 Last data filed at 09/15/2019 0800 Gross per 24 hour  Intake 360 ml  Output --  Net 360 ml   Filed Weights   09/13/19 1919  Weight: 66.2 kg    Exam: General: Awake alert no acute distress CV: Rate and rhythm no murmur gallop or rub no lower extremity edema Respiratory: Mild increased work of breathing with conversation.  Audible wheezing.  Breath sounds quite diminished upon auscultation throughout both lung fields.  I hear no crackles Abdomen: Soft  positive bowel sounds throughout nontender to palpation no guarding or rebounding Musculoskeletal: Joints without swelling/erythema  Neuro alert and oriented x3 speech clear facial symmetry  Data Reviewed:   I have personally reviewed following labs and imaging studies:  Labs: Labs show the following:   Basic Metabolic Panel: Recent Labs  Lab 09/13/19 1920 09/13/19 1920 09/14/19 0557 09/15/19 0204  NA 142  --  141 137  K 4.2   < > 3.3* 4.0  CL 102  --  100 103  CO2 26  --  22 25  GLUCOSE 166*  --  307* 204*  BUN 23  --  23 23  CREATININE 1.31*  --  1.50* 1.19*  CALCIUM 9.1  --  9.5 9.3   < > = values in this interval not displayed.   GFR Estimated Creatinine Clearance: 42.8 mL/min (A) (by C-G formula based on SCr of 1.19 mg/dL (H)). Liver Function Tests: No results for input(s): AST, ALT, ALKPHOS, BILITOT, PROT, ALBUMIN in the last 168 hours. No results for input(s): LIPASE, AMYLASE in the last 168 hours. No results for input(s): AMMONIA in the last 168 hours. Coagulation profile No results for input(s): INR, PROTIME in the last 168 hours.  CBC: Recent Labs  Lab 09/13/19 1920 09/14/19 0557 09/15/19 0204  WBC 9.4 12.4* 14.6*  NEUTROABS  --   --  11.4*  HGB 11.5* 11.1* 10.5*  HCT 35.4* 35.2* 32.0*  MCV 97.3 99.7 96.4  PLT 280 258 242   Cardiac Enzymes: No results for input(s): CKTOTAL, CKMB, CKMBINDEX, TROPONINI in the last 168 hours. BNP (last 3 results) No results for input(s): PROBNP in the last 8760 hours. CBG: Recent Labs  Lab 09/14/19 1033 09/14/19 1608 09/14/19 2113 09/15/19 0619 09/15/19 1111  GLUCAP 223* 209* 137* 133* 161*   D-Dimer: No results for input(s): DDIMER in the last 72 hours. Hgb A1c: Recent Labs    09/14/19 0557  HGBA1C 6.5*   Lipid Profile: No results for input(s): CHOL, HDL, LDLCALC, TRIG, CHOLHDL, LDLDIRECT in the last 72 hours. Thyroid function studies: No results for input(s): TSH, T4TOTAL, T3FREE, THYROIDAB in the  last 72 hours.  Invalid input(s): FREET3 Anemia work up: No results for input(s): VITAMINB12, FOLATE, FERRITIN, TIBC, IRON, RETICCTPCT in the last 72 hours. Sepsis Labs: Recent Labs  Lab 09/13/19 1920 09/14/19 0557 09/15/19 0204  WBC 9.4 12.4* 14.6*    Microbiology Recent Results (from the past 240 hour(s))  SARS Coronavirus 2 by RT PCR (hospital order, performed in [facility] hospital lab) Nasopharyngeal Nasopharyngeal Swab     Status: None   Collection Time: 09/14/19  1:44 AM   Specimen: Nasopharyngeal Swab  Result Value Ref Range Status   SARS Coronavirus 2 NEGATIVE NEGATIVE Final    Comment: (NOTE) SARS-CoV-2 target nucleic acids are NOT DETECTED. The SARS-CoV-2 RNA is  generally detectable in upper and lower respiratory specimens during the acute phase of infection. The lowest concentration of SARS-CoV-2 viral copies this assay can detect is 250 copies / mL. A negative result does not preclude SARS-CoV-2 infection and should not be used as the sole basis for treatment or other patient management decisions.  A negative result may occur with improper specimen collection / handling, submission of specimen other than nasopharyngeal swab, presence of viral mutation(s) within the areas targeted by this assay, and inadequate number of viral copies (<250 copies / mL). A negative result must be combined with clinical observations, patient history, and epidemiological information. Fact Sheet for Patients:   StrictlyIdeas.no Fact Sheet for Healthcare Providers: BankingDealers.co.za This test is not yet approved or cleared  by the [place] FDA and has been authorized for detection and/or diagnosis of SARS-CoV-2 by FDA under an Emergency Use Authorization (EUA).  This EUA will remain in effect (meaning this test can be used) for the duration of the COVID-19 declaration under Section 564(b)(1) of the Act, 21 U.S.C. section  360bbb-3(b)(1), unless the authorization is terminated or revoked sooner. Performed at [facility], [place] [address], [place], [place] [address]   Respiratory Panel by PCR     Status: Abnormal   Collection Time: 09/14/19  2:35 PM   Specimen: Nasopharyngeal Swab; Respiratory  Result Value Ref Range Status   Adenovirus NOT DETECTED NOT DETECTED Final   Coronavirus 229E NOT DETECTED NOT DETECTED Final    Comment: (NOTE) The Coronavirus on the Respiratory Panel, DOES NOT test for the novel  Coronavirus (2019 nCoV)    Coronavirus HKU1 NOT DETECTED NOT DETECTED Final   Coronavirus NL63 NOT DETECTED NOT DETECTED Final   Coronavirus OC43 NOT DETECTED NOT DETECTED Final   Metapneumovirus NOT DETECTED NOT DETECTED Final   Rhinovirus / Enterovirus DETECTED (A) NOT DETECTED Final   Influenza A NOT DETECTED NOT DETECTED Final   Influenza B NOT DETECTED NOT DETECTED Final   Parainfluenza Virus 1 NOT DETECTED NOT DETECTED Final   Parainfluenza Virus 2 NOT DETECTED NOT DETECTED Final   Parainfluenza Virus 3 NOT DETECTED NOT DETECTED Final   Parainfluenza Virus 4 NOT DETECTED NOT DETECTED Final   Respiratory Syncytial Virus NOT DETECTED NOT DETECTED Final   Bordetella pertussis NOT DETECTED NOT DETECTED Final   Chlamydophila pneumoniae NOT DETECTED NOT DETECTED Final   Mycoplasma pneumoniae NOT DETECTED NOT DETECTED Final    Comment: Performed at [facility], [place]. [address], [place] [address]    Procedures and diagnostic studies:  DG Chest 2 View  Result Date: 09/13/2019 CLINICAL DATA:  Asthma, short of breath EXAM: CHEST - 2 VIEW COMPARISON:  12/02/2018 FINDINGS: The heart size and mediastinal contours are within normal limits. Both lungs are clear. The visualized skeletal structures are unremarkable. IMPRESSION: No active cardiopulmonary disease. Electronically Signed   By: Donavan Foil M.D.   On: 09/13/2019 19:50    Medications:   . arformoterol  15 mcg Nebulization  BID  . aspirin EC  81 mg Oral Daily  . atorvastatin  40 mg Oral QHS  . budesonide (PULMICORT) nebulizer solution  0.5 mg Nebulization BID  . diltiazem  240 mg Oral Daily  . divalproex  1,000 mg Oral QHS  . enoxaparin (LOVENOX) injection  40 mg Subcutaneous Q24H  . hydrALAZINE  25 mg Oral Q8H  . insulin aspart  0-15 Units Subcutaneous TID WC  . insulin aspart  0-5 Units Subcutaneous QHS  .  insulin aspart  3 Units Subcutaneous TID WC  . levalbuterol  0.63 mg Nebulization TID  . memantine  10 mg Oral BID  . methylPREDNISolone (SOLU-MEDROL) injection  80 mg Intravenous Q12H  . montelukast  10 mg Oral QHS  . multivitamin with minerals  1 tablet Oral Daily  . nebivolol  10 mg Oral Daily  . pantoprazole  40 mg Oral Daily  . potassium chloride SA  20 mEq Oral Daily  . revefenacin  175 mcg Nebulization Daily   Continuous Infusions:   LOS: 0 days   Radene Gunning NP  [facility]   How to contact the [facility] Attending or Consulting provider [place] or covering provider during after hours [place], for this patient?  1. Check the care team in [facility] and look for a) attending/consulting TRH provider listed and b) the [facility] team listed 2. Log into [email or web address] and use [facility] universal password to access. If you do not have the password, please contact the hospital operator. 3. Locate the [facility] provider you are looking for under [facility] and page to a number that you can be directly reached. 4. If you still have difficulty reaching the provider, please page the [facility] (Director on Call) for the Hospitalists listed on amion for assistance.  09/15/2019, 1:32 PM

## 2019-09-15 NOTE — Progress Notes (Signed)
NAME:  [name], MRN:  [id number], DOB:  April 01, 1951, LOS: 0 ADMISSION DATE:  09/13/2019, CONSULTATION DATE:  09/14/2019 REFERRING MD:  Dr. Horris Latino, CHIEF COMPLAINT:  wheeze   Brief History   69 yo FM, h/o asthma, UA cough syndrome, chronic diastolic hf, DMII, GERD, HTN. She is managed by Dr. Melvyn [name] for asthma and UACS.   History of present illness   69 yo FM, h/o asthma, UA cough syndrome, chronic diastolic hf, DMII, GERD, HTN. She is managed by Dr. Melvyn [name] for asthma and UACS. She was last seen in the office on 09/09/2019. The patient states that she was just outside and digging her garden. She was exposed to a lot of pollen. She felt like she started to wheeze and her chest was tight. She has been using her albuterol inhaler. Per her med list she is managed with pulmicort neb at home. fetl thtoughout the years treated for more of an upper airway issue. She has been on several inhalers in the past dulera and symbicort. 2017 had neg EOS, Ige 87 and positive RAST to grass and trees.   Here today with increased WOB, bilateral wheezing and chest tightness.   Past Medical History   Past Medical History:  Diagnosis Date  . Anxiety   . Asthma   . Bronchitis   . Chronic diastolic CHF (congestive heart failure) ([place])   . Chronic diastolic CHF (congestive heart failure) ([place]) 07/17/2016  . Coronary artery disease    a. NSTEMI 12/2015 - LHC 01/18/16: s/p overlapping DESx2 to RCA, 10% ost-prox Cx,10% mLAD.  Marland Kitchen Depression   . Diabetes mellitus   . Dyslipidemia   . GERD (gastroesophageal reflux disease)   . Heart attack ([place])   . Hypertension   . Hypertensive heart disease   . NSTEMI (non-ST elevated myocardial infarction) ([place]) 12/2015  . Pneumonia      Significant Hospital Events     Consults:  PCCM  Procedures:    Significant Diagnostic Tests:    Micro Data:  RVP neg  Antimicrobials:  None    Interim history/subjective:   Patient feel better today.  Less chest tightness.   Breathing better.  Objective   Blood pressure (!) 170/76, pulse 69, temperature 98.6 F (37 C), temperature source Oral, resp. rate 16, height 5\' 7"  (1.702 m), weight 66.2 kg, SpO2 100 %.        Intake/Output Summary (Last 24 hours) at 09/15/2019 1118 Last data filed at 09/15/2019 0800 Gross per 24 hour  Intake 360 ml  Output --  Net 360 ml   Filed Weights   09/13/19 1919  Weight: 66.2 kg    Examination: General: Elderly female resting in bed HENT: Tracking appropriately, NCAT Lungs: Clear to auscultation, does have transmitted upper airway noise. Cardiovascular: Regular rate rhythm, S1-S2 Abdomen: Soft, nontender nondistended Extremities: No edema Neuro: Alert, following commands no deficit GU: Deferred  PFT Results Latest Ref Rng & Units 04/18/2018  FVC-Pre L 1.19  FVC-Predicted Pre % 55  FVC-Post L 1.25  FVC-Predicted Post % 58  Pre FEV1/FVC % % 89  Post FEV1/FCV % % 91  FEV1-Pre L 1.05  FEV1-Predicted Pre % 64  FEV1-Post L 1.13  DLCO UNC% % 53  DLCO COR %Predicted % 147  TLC L 2.73  TLC % Predicted % 58  RV % Predicted % 64    PFTs reviewed CXR reviewed: clear no infiltrate. The patient's images have been independently reviewed by me.    Resolved Hospital Problem  list     Assessment & Plan:   Acute Exacerbation of Asthma, she has a flare of small airway disease Prior IgE 87, Prior positive RAST  UACS Hyperglycemia from steroids  Possible VCD, does have upper airway. -Continue steroids, would consider taper to oral prednisone -Continue scheduled nebs -Continue Singulair -Discharged with maintenance inhaler that she was on prior   Labs    2017 RAST: Allergen, D pternoyssinus,d7 kU/L 0.17High    D. farinae kU/L 0.11High    Cat Dander kU/L <0.10   Dog Dander kU/L <0.10   [place] Grass kU/L 0.89High    Timothy Grass kU/L 3.93High    [place] Grass kU/L 0.78High    Cockroach kU/L 0.20High    Allergen, P. notatum, m1 kU/L <0.10   Allergen, C.  Herbarum, M2 kU/L <0.10   Aspergillus fumigatus, m3 kU/L <0.10   Allergen, A. alternata, m6 kU/L <0.10   Box Elder IgE kU/L 0.31High    Allergen, Comm Silver Wendee Copp, t9 kU/L 0.33High    Allergen, Cedar tree, t12 kU/L 0.36High    Allergen, Oak,t7 kU/L 0.25High    Elm IgE kU/L 0.27High    Allergen, Cottonwood, t14 kU/L 0.22High    Pecan/Hickory Tree IgE kU/L 1.68High    Allergen, Mulberry, t76 kU/L 0.18High    Common Ragweed kU/L 0.39High    Rough Pigweed IgE kU/L 0.22High    Sheep Sorrel IgE kU/L 0.23High    IgE (Immunoglobulin E), Serum <115 kU/L 87   Allergen, Mouse Urine Protein, e78 kU/L <0.10      CBC: Recent Labs  Lab 09/13/19 1920 09/14/19 0557 09/15/19 0204  WBC 9.4 12.4* 14.6*  NEUTROABS  --   --  11.4*  HGB 11.5* 11.1* 10.5*  HCT 35.4* 35.2* 32.0*  MCV 97.3 99.7 96.4  PLT 280 258 [id number]    Basic Metabolic Panel: Recent Labs  Lab 09/13/19 1920 09/14/19 0557 09/15/19 0204  NA 142 141 137  K 4.2 3.3* 4.0  CL 102 100 103  CO2 26 22 25   GLUCOSE 166* 307* 204*  BUN 23 23 23   CREATININE 1.31* 1.50* 1.19*  CALCIUM 9.1 9.5 9.3   GFR: Estimated Creatinine Clearance: 42.8 mL/min (A) (by C-G formula based on SCr of 1.19 mg/dL (H)). Recent Labs  Lab 09/13/19 1920 09/14/19 0557 09/15/19 0204  WBC 9.4 12.4* 14.6*    Liver Function Tests: No results for input(s): AST, ALT, ALKPHOS, BILITOT, PROT, ALBUMIN in the last 168 hours. No results for input(s): LIPASE, AMYLASE in the last 168 hours. No results for input(s): AMMONIA in the last 168 hours.  ABG    Component Value Date/Time   PHART 7.382 08/12/2017 0550   PCO2ART 45.0 08/12/2017 0550   PO2ART 163.0 (H) 08/12/2017 0550   HCO3 28.6 (H) 03/15/2018 1100   TCO2 30 03/15/2018 1100   ACIDBASEDEF 1.0 12/19/2017 1820   O2SAT 71.0 03/15/2018 1100     Coagulation Profile: No results for input(s): INR, PROTIME in the last 168 hours.  Cardiac Enzymes: No results for input(s): CKTOTAL, CKMB, CKMBINDEX,  TROPONINI in the last 168 hours.  HbA1C: Hgb A1c MFr Bld  Date/Time Value Ref Range Status  09/14/2019 05:57 AM 6.5 (H) 4.8 - 5.6 % Final    Comment:    (NOTE) Pre diabetes:          5.7%-6.4% Diabetes:              >6.4% Glycemic control for   <7.0% adults with diabetes   10/06/2017 02:53 AM  7.3 (H) 4.8 - 5.6 % Final    Comment:    (NOTE) Pre diabetes:          5.7%-6.4% Diabetes:              >6.4% Glycemic control for   <7.0% adults with diabetes     CBG: Recent Labs  Lab 09/14/19 0628 09/14/19 1033 09/14/19 1608 09/14/19 2113 09/15/19 0619  GLUCAP 291* 223* 209* 137* 133*    Garner Nash, DO [facility] 09/15/2019 11:18 AM

## 2019-09-15 NOTE — TOC Initial Note (Signed)
Transition of Care [facility]) - Initial/Assessment Note    Patient Details  Name: [name] MRN: [id number] Date of Birth: 04/24/1951  [facility]) CM/SW Contact:    Marilu Favre, RN Phone Number: 09/15/2019, 1:41 PM  Clinical Narrative:                  Confirmed face sheet information with patient. Patient from home with husband. Provided Medicare.gov list of home health agencies, no preference. Jenny Reichmann with [facility] accepted referral.  Expected Discharge Plan: [place] Barriers to Discharge: Continued Medical Work up   Patient Goals and CMS Choice Patient states their goals for this hospitalization and ongoing recovery are:: to return to home CMS Medicare.gov Compare Post Acute Care list provided to:: Patient Choice offered to / list presented to : Patient  Expected Discharge Plan and Services Expected Discharge Plan: [place]   Discharge Planning Services: CM Consult Post Acute Care Choice: [place] arrangements for the past 2 months: Single Family Home                 DME Arranged: N/A DME Agency: NA       HH Arranged: PT HH Agency: [facility] Date [place]: 09/15/19 Time HH Agency Contacted: 39 Representative spoke with at [place]: cory  Prior Living Arrangements/Services Living arrangements for the past 2 months: [place] Lives with:: Spouse Patient language and need for interpreter reviewed:: Yes Do you feel safe going back to the place where you live?: Yes      Need for Family Participation in Patient Care: Yes (Comment) Care giver support system in place?: Yes (comment)   Criminal Activity/Legal Involvement Pertinent to Current Situation/Hospitalization: No - Comment as needed  Activities of Daily Living Home Assistive Devices/Equipment: None ADL Screening (condition at time of admission) Patient's cognitive ability adequate to safely complete daily  activities?: Yes Is the patient deaf or have difficulty hearing?: No Does the patient have difficulty seeing, even when wearing glasses/contacts?: No Does the patient have difficulty concentrating, remembering, or making decisions?: No Patient able to express need for assistance with ADLs?: Yes Does the patient have difficulty dressing or bathing?: No Independently performs ADLs?: Yes (appropriate for developmental age) Does the patient have difficulty walking or climbing stairs?: No Weakness of Legs: Both Weakness of Arms/Hands: None  Permission Sought/Granted   Permission granted to share information with : No              Emotional Assessment Appearance:: Appears stated age Attitude/Demeanor/Rapport: Engaged Affect (typically observed): Accepting Orientation: : Oriented to Self, Oriented to Place, Oriented to  Time, Oriented to Situation Alcohol / Substance Use: Not Applicable Psych Involvement: No (comment)  Admission diagnosis:  Shortness of breath [R06.02] Wheezing [R06.2] Exacerbation of asthma, unspecified asthma severity, unspecified whether persistent [J45.901] Patient Active Problem List   Diagnosis Date Noted  . Wheezing 09/13/2019  . Memory loss 03/20/2019  . Severe persistent asthma without complication [id number]  . DM2 (diabetes mellitus, type 2) ([place]) 03/15/2018  . CKD (chronic kidney disease), stage III 03/15/2018  . Dyspnea 08/12/2017  . Macrocytic anemia 08/12/2017  . Therapeutic drug monitoring 05/17/2017  . Lung nodule 08/01/2016  . Influenza B 07/18/2016  . Chronic diastolic CHF (congestive heart failure) ([place]) 07/17/2016  . Acute renal failure superimposed on stage 3 chronic kidney disease ([place]) 07/17/2016  . Right leg swelling 02/27/2016  . Acute respiratory failure with  hypoxia ([place])   . Uncontrolled type 2 diabetes mellitus with complication ([place])   . Acute pulmonary embolism ([place])   . HLD (hyperlipidemia)   . Seizures ([place]) 01/27/2016  .  Numbness 01/27/2016  . CAD in native artery 01/20/2016  . Hypertensive heart disease 01/20/2016  . NSTEMI (non-ST elevated myocardial infarction) ([place]) 01/18/2016  . Acute bronchitis with asthma with acute exacerbation 01/16/2016  . Hypokalemia 01/16/2016  . Hypomagnesemia 01/16/2016  . Acute respiratory failure ([place]) 01/16/2016  . Dyspnea on exertion   . Asthma exacerbation 07/21/2015  . Upper airway cough syndrome with VCD 08/28/2014  . Diabetes mellitus type 2, controlled ([place]) 08/06/2014  . Hyperlipidemia 08/06/2014  . Allergic rhinitis 07/22/2008  . 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 DYSPNEA 07/22/2008  . Diabetes mellitus type 2, uncontrolled ([place]) 07/21/2008  . DIABETIC PERIPHERAL NEUROPATHY 07/21/2008  . Depression with anxiety 07/21/2008  . Essential hypertension 07/21/2008  . Mild persistent chronic asthma without complication [id number]  . G E R D 07/21/2008  . PULMONARY EMBOLISM, HX OF 07/21/2008   PCP:  [name], MD Pharmacy:   [facility]), [place] - [address] [place] ([place] [address] Phone: [phone] Fax: [phone]     Social Determinants of Health (SDOH) Interventions    Readmission Risk Interventions No flowsheet data found.

## 2019-09-16 DIAGNOSIS — J449 Chronic obstructive pulmonary disease, unspecified: Secondary | ICD-10-CM | POA: Diagnosis present

## 2019-09-16 DIAGNOSIS — J4550 Severe persistent asthma, uncomplicated: Secondary | ICD-10-CM

## 2019-09-16 DIAGNOSIS — N179 Acute kidney failure, unspecified: Secondary | ICD-10-CM | POA: Diagnosis present

## 2019-09-16 DIAGNOSIS — E119 Type 2 diabetes mellitus without complications: Secondary | ICD-10-CM | POA: Diagnosis not present

## 2019-09-16 DIAGNOSIS — F419 Anxiety disorder, unspecified: Secondary | ICD-10-CM | POA: Diagnosis present

## 2019-09-16 DIAGNOSIS — I2510 Atherosclerotic heart disease of native coronary artery without angina pectoris: Secondary | ICD-10-CM | POA: Diagnosis present

## 2019-09-16 DIAGNOSIS — Z79899 Other long term (current) drug therapy: Secondary | ICD-10-CM | POA: Diagnosis not present

## 2019-09-16 DIAGNOSIS — I252 Old myocardial infarction: Secondary | ICD-10-CM | POA: Diagnosis not present

## 2019-09-16 DIAGNOSIS — Z8249 Family history of ischemic heart disease and other diseases of the circulatory system: Secondary | ICD-10-CM | POA: Diagnosis not present

## 2019-09-16 DIAGNOSIS — J4551 Severe persistent asthma with (acute) exacerbation: Secondary | ICD-10-CM | POA: Diagnosis present

## 2019-09-16 DIAGNOSIS — Z955 Presence of coronary angioplasty implant and graft: Secondary | ICD-10-CM | POA: Diagnosis not present

## 2019-09-16 DIAGNOSIS — Z7951 Long term (current) use of inhaled steroids: Secondary | ICD-10-CM | POA: Diagnosis not present

## 2019-09-16 DIAGNOSIS — I130 Hypertensive heart and chronic kidney disease with heart failure and stage 1 through stage 4 chronic kidney disease, or unspecified chronic kidney disease: Secondary | ICD-10-CM | POA: Diagnosis present

## 2019-09-16 DIAGNOSIS — I10 Essential (primary) hypertension: Secondary | ICD-10-CM | POA: Diagnosis not present

## 2019-09-16 DIAGNOSIS — R0602 Shortness of breath: Secondary | ICD-10-CM | POA: Diagnosis present

## 2019-09-16 DIAGNOSIS — Z7984 Long term (current) use of oral hypoglycemic drugs: Secondary | ICD-10-CM | POA: Diagnosis not present

## 2019-09-16 DIAGNOSIS — N183 Chronic kidney disease, stage 3 unspecified: Secondary | ICD-10-CM | POA: Diagnosis present

## 2019-09-16 DIAGNOSIS — E1165 Type 2 diabetes mellitus with hyperglycemia: Secondary | ICD-10-CM | POA: Diagnosis present

## 2019-09-16 DIAGNOSIS — J45901 Unspecified asthma with (acute) exacerbation: Secondary | ICD-10-CM | POA: Diagnosis not present

## 2019-09-16 DIAGNOSIS — Z825 Family history of asthma and other chronic lower respiratory diseases: Secondary | ICD-10-CM | POA: Diagnosis not present

## 2019-09-16 DIAGNOSIS — Z20822 Contact with and (suspected) exposure to covid-19: Secondary | ICD-10-CM | POA: Diagnosis present

## 2019-09-16 DIAGNOSIS — K219 Gastro-esophageal reflux disease without esophagitis: Secondary | ICD-10-CM | POA: Diagnosis present

## 2019-09-16 DIAGNOSIS — I5032 Chronic diastolic (congestive) heart failure: Secondary | ICD-10-CM | POA: Diagnosis present

## 2019-09-16 DIAGNOSIS — F329 Major depressive disorder, single episode, unspecified: Secondary | ICD-10-CM | POA: Diagnosis present

## 2019-09-16 DIAGNOSIS — T380X5A Adverse effect of glucocorticoids and synthetic analogues, initial encounter: Secondary | ICD-10-CM | POA: Diagnosis present

## 2019-09-16 DIAGNOSIS — Z87891 Personal history of nicotine dependence: Secondary | ICD-10-CM | POA: Diagnosis not present

## 2019-09-16 DIAGNOSIS — Z833 Family history of diabetes mellitus: Secondary | ICD-10-CM | POA: Diagnosis not present

## 2019-09-16 DIAGNOSIS — Z7982 Long term (current) use of aspirin: Secondary | ICD-10-CM | POA: Diagnosis not present

## 2019-09-16 DIAGNOSIS — E785 Hyperlipidemia, unspecified: Secondary | ICD-10-CM | POA: Diagnosis present

## 2019-09-16 LAB — CBC WITH DIFFERENTIAL/PLATELET
Abs Immature Granulocytes: 0.29 10*3/uL — ABNORMAL HIGH (ref 0.00–0.07)
Basophils Absolute: 0 10*3/uL (ref 0.0–0.1)
Basophils Relative: 0 %
Eosinophils Absolute: 0 10*3/uL (ref 0.0–0.5)
Eosinophils Relative: 0 %
HCT: 33.8 % — ABNORMAL LOW (ref 36.0–46.0)
Hemoglobin: 11.2 g/dL — ABNORMAL LOW (ref 12.0–15.0)
Immature Granulocytes: 2 %
Lymphocytes Relative: 12 %
Lymphs Abs: 1.5 10*3/uL (ref 0.7–4.0)
MCH: 31.9 pg (ref 26.0–34.0)
MCHC: 33.1 g/dL (ref 30.0–36.0)
MCV: 96.3 fL (ref 80.0–100.0)
Monocytes Absolute: 0.4 10*3/uL (ref 0.1–1.0)
Monocytes Relative: 3 %
Neutro Abs: 10.9 10*3/uL — ABNORMAL HIGH (ref 1.7–7.7)
Neutrophils Relative %: 83 %
Platelets: 264 10*3/uL (ref 150–400)
RBC: 3.51 MIL/uL — ABNORMAL LOW (ref 3.87–5.11)
RDW: 13.8 % (ref 11.5–15.5)
WBC: 13.1 10*3/uL — ABNORMAL HIGH (ref 4.0–10.5)
nRBC: 0.2 % (ref 0.0–0.2)

## 2019-09-16 LAB — GLUCOSE, CAPILLARY
Glucose-Capillary: 205 mg/dL — ABNORMAL HIGH (ref 70–99)
Glucose-Capillary: 296 mg/dL — ABNORMAL HIGH (ref 70–99)
Glucose-Capillary: 170 mg/dL — ABNORMAL HIGH (ref 70–99)
Glucose-Capillary: 190 mg/dL — ABNORMAL HIGH (ref 70–99)

## 2019-09-16 LAB — BASIC METABOLIC PANEL
Anion gap: 11 (ref 5–15)
BUN: 28 mg/dL — ABNORMAL HIGH (ref 8–23)
CO2: 22 mmol/L (ref 22–32)
Calcium: 9.2 mg/dL (ref 8.9–10.3)
Chloride: 102 mmol/L (ref 98–111)
Creatinine, Ser: 1.05 mg/dL — ABNORMAL HIGH (ref 0.44–1.00)
GFR calc Af Amer: >60 mL/min (ref >60)
GFR calc non Af Amer: 54 mL/min — ABNORMAL LOW (ref >60)
Glucose, Bld: 232 mg/dL — ABNORMAL HIGH (ref 70–99)
Potassium: 5.1 mmol/L (ref 3.5–5.1)
Sodium: 135 mmol/L (ref 135–145)

## 2019-09-16 MED ORDER — INSULIN DETEMIR 100 UNIT/ML ~~LOC~~ SOLN
8.0000 [IU] | Freq: Every day | SUBCUTANEOUS | Status: DC
  Administered 2019-09-16 – 2019-09-17 (×2): 8 [IU] via SUBCUTANEOUS
  Filled 2019-09-16 (×3): qty 0.08

## 2019-09-16 NOTE — Progress Notes (Signed)
Progress Note    [name]  KDT:[id number] DOB: 07-May-1950  DOA: 09/13/2019 PCP: [name], MD    Brief Narrative:     Medical records reviewed and are as summarized below:  Jacqueline GIESELMAN is an 69 y.o. female with a past medical history that includes UA cough syndrome, chronic diastolic heart failure, diabetes type 2, hypertension, admitted May 16 with respiratory distress secondary to asthma exacerbation and small airway disease flare per pulmonology likely triggered by seasonal allergies.  Provided with scheduled nebs steroids oxygen supplementation and flutter valve.  Continues with dyspnea on exertion a little less wheezing but still quite rhonchorous.  Assessment/Plan:   Principal Problem:   Asthma exacerbation Active Problems:   Essential hypertension   Severe persistent asthma without complication   Diabetes mellitus type 2, controlled (HCC)   Chronic diastolic CHF (congestive heart failure) (HCC)   Upper airway cough syndrome with VCD  #1.  Asthma exacerbation.  Patient with a history of severe persistent asthma without complication and UA cough syndrome.  X-ray with no active cardiopulmonary disease.  Oxygen saturation level greater than 90% on room air.  Chart review indicates was evaluated by pulmonology in the outpatient setting 4 days ago.  Note indicates pulmonology opined likely more of an upper airway cough syndrome and prednisone taper was started. Presented more like upper airway cough syndrome.  Improved air movement with a little less wheezing but still quite rhonchorous and experiencing dyspnea with exertion. -Continue Solu-Medrol  -Flutter valve -Continue scheduled nebs -As needed albuterol -Monitor oxygen saturation level -Appreciate pulmonology consult  #2.  Hypertension.    Blood pressure high end of normal.  Home medications include diltiazem, hydralazine bystolic.  Of note she also has as needed hydralazine with parameters.  Chart review  indicates she has met this threshold 7 times since her admission and never received  as needed hydralazine -Continue home meds -As needed hydralazine -BP q4hrs x3  #3.  Diabetes type 2.  Home medications include Metformin.  CBGs elevated likely due to steroids.po intake good -Continue to hold Metformin for now -leveimir 8 units. -Sliding scale insulin for optimal control.  Adjust as indicated -Meal coverage   #4.  CHF.  Compensated.  Echo done 2019 revealed an EF of 65 to 70% with normal wall motion grade 2 diastolic dysfunction.  Home medications include Demadex as needed -Daily weights -Intake and output   Family Communication/Anticipated D/C date and plan/Code Status   DVT prophylaxis: Lovenox ordered. Code Status: Full Code.  Family Communication: patient Disposition Plan: Status is: Observation  The patient remains OBS appropriate and will d/c before 2 midnights.  Dispo: The patient is from: Home              Anticipated d/c is to: Home              Anticipated d/c date is: 1 day              Patient currently is not medically stable to d/c.      Medical Consultants:    pulmonology   Anti-Infectives:    None  Subjective:   Sitting up in bed watching TV.  Denies pain or discomfort.  Reports improved breathing somewhat.  Objective:    Vitals:   09/16/19 0757 09/16/19 0801 09/16/19 0810 09/16/19 1000  BP:   (!) 185/82 (!) 148/72  Pulse:   73   Resp:   18   Temp:   98.1 F (36.7  C)   TempSrc:   Oral   SpO2: 98% 98% 100%   Weight:      Height:        Intake/Output Summary (Last 24 hours) at 09/16/2019 1135 Last data filed at 09/16/2019 0230 Gross per 24 hour  Intake 540 ml  Output --  Net 540 ml   Filed Weights   09/13/19 1919  Weight: 66.2 kg    Exam: Gen: Awake alert no acute distress CV regular rate and rhythm no murmur gallop or rub no lower extremity edema Respiratory: Mild increased work of breathing with conversation.  Breath  sounds with improved air movement faint wheeze diffuse rhonchi throughout.  No crackles Abdomen: Nondistended soft positive bowel sounds throughout nontender to palpation no guarding or rebounding Musculoskeletal: Joints without swelling/erythema moves all extremities spontaneously full range of motion Neuro: Alert and oriented x3 speech clear facial symmetry  Data Reviewed:   I have personally reviewed following labs and imaging studies:  Labs: Labs show the following:   Basic Metabolic Panel: Recent Labs  Lab 09/13/19 1920 09/13/19 1920 09/14/19 0557 09/14/19 0557 09/15/19 0204 09/16/19 0339  NA 142  --  141  --  137 135  K 4.2   < > 3.3*   < > 4.0 5.1  CL 102  --  100  --  103 102  CO2 26  --  22  --  25 22  GLUCOSE 166*  --  307*  --  204* 232*  BUN 23  --  23  --  23 28*  CREATININE 1.31*  --  1.50*  --  1.19* 1.05*  CALCIUM 9.1  --  9.5  --  9.3 9.2   < > = values in this interval not displayed.   GFR Estimated Creatinine Clearance: 48.5 mL/min (A) (by C-G formula based on SCr of 1.05 mg/dL (H)). Liver Function Tests: No results for input(s): AST, ALT, ALKPHOS, BILITOT, PROT, ALBUMIN in the last 168 hours. No results for input(s): LIPASE, AMYLASE in the last 168 hours. No results for input(s): AMMONIA in the last 168 hours. Coagulation profile No results for input(s): INR, PROTIME in the last 168 hours.  CBC: Recent Labs  Lab 09/13/19 1920 09/14/19 0557 09/15/19 0204 09/16/19 0339  WBC 9.4 12.4* 14.6* 13.1*  NEUTROABS  --   --  11.4* 10.9*  HGB 11.5* 11.1* 10.5* 11.2*  HCT 35.4* 35.2* 32.0* 33.8*  MCV 97.3 99.7 96.4 96.3  PLT 280 258 242 264   Cardiac Enzymes: No results for input(s): CKTOTAL, CKMB, CKMBINDEX, TROPONINI in the last 168 hours. BNP (last 3 results) No results for input(s): PROBNP in the last 8760 hours. CBG: Recent Labs  Lab 09/15/19 0619 09/15/19 1111 09/15/19 1617 09/15/19 2223 09/16/19 0620  GLUCAP 133* 161* 198* 275* 205*    D-Dimer: No results for input(s): DDIMER in the last 72 hours. Hgb A1c: Recent Labs    09/14/19 0557  HGBA1C 6.5*   Lipid Profile: No results for input(s): CHOL, HDL, LDLCALC, TRIG, CHOLHDL, LDLDIRECT in the last 72 hours. Thyroid function studies: No results for input(s): TSH, T4TOTAL, T3FREE, THYROIDAB in the last 72 hours.  Invalid input(s): FREET3 Anemia work up: No results for input(s): VITAMINB12, FOLATE, FERRITIN, TIBC, IRON, RETICCTPCT in the last 72 hours. Sepsis Labs: Recent Labs  Lab 09/13/19 1920 09/14/19 0557 09/15/19 0204 09/16/19 0339  WBC 9.4 12.4* 14.6* 13.1*    Microbiology Recent Results (from the past 240 hour(s))  SARS Coronavirus 2 by  RT PCR (hospital order, performed in [facility] hospital lab) Nasopharyngeal Nasopharyngeal Swab     Status: None   Collection Time: 09/14/19  1:44 AM   Specimen: Nasopharyngeal Swab  Result Value Ref Range Status   SARS Coronavirus 2 NEGATIVE NEGATIVE Final    Comment: (NOTE) SARS-CoV-2 target nucleic acids are NOT DETECTED. The SARS-CoV-2 RNA is generally detectable in upper and lower respiratory specimens during the acute phase of infection. The lowest concentration of SARS-CoV-2 viral copies this assay can detect is 250 copies / mL. A negative result does not preclude SARS-CoV-2 infection and should not be used as the sole basis for treatment or other patient management decisions.  A negative result may occur with improper specimen collection / handling, submission of specimen other than nasopharyngeal swab, presence of viral mutation(s) within the areas targeted by this assay, and inadequate number of viral copies (<250 copies / mL). A negative result must be combined with clinical observations, patient history, and epidemiological information. Fact Sheet for Patients:   StrictlyIdeas.no Fact Sheet for Healthcare Providers: BankingDealers.co.za This test is  not yet approved or cleared  by the [place] FDA and has been authorized for detection and/or diagnosis of SARS-CoV-2 by FDA under an Emergency Use Authorization (EUA).  This EUA will remain in effect (meaning this test can be used) for the duration of the COVID-19 declaration under Section 564(b)(1) of the Act, 21 U.S.C. section 360bbb-3(b)(1), unless the authorization is terminated or revoked sooner. Performed at [facility], [place] [address], [place], [place] [address]   Respiratory Panel by PCR     Status: Abnormal   Collection Time: 09/14/19  2:35 PM   Specimen: Nasopharyngeal Swab; Respiratory  Result Value Ref Range Status   Adenovirus NOT DETECTED NOT DETECTED Final   Coronavirus 229E NOT DETECTED NOT DETECTED Final    Comment: (NOTE) The Coronavirus on the Respiratory Panel, DOES NOT test for the novel  Coronavirus (2019 nCoV)    Coronavirus HKU1 NOT DETECTED NOT DETECTED Final   Coronavirus NL63 NOT DETECTED NOT DETECTED Final   Coronavirus OC43 NOT DETECTED NOT DETECTED Final   Metapneumovirus NOT DETECTED NOT DETECTED Final   Rhinovirus / Enterovirus DETECTED (A) NOT DETECTED Final   Influenza A NOT DETECTED NOT DETECTED Final   Influenza B NOT DETECTED NOT DETECTED Final   Parainfluenza Virus 1 NOT DETECTED NOT DETECTED Final   Parainfluenza Virus 2 NOT DETECTED NOT DETECTED Final   Parainfluenza Virus 3 NOT DETECTED NOT DETECTED Final   Parainfluenza Virus 4 NOT DETECTED NOT DETECTED Final   Respiratory Syncytial Virus NOT DETECTED NOT DETECTED Final   Bordetella pertussis NOT DETECTED NOT DETECTED Final   Chlamydophila pneumoniae NOT DETECTED NOT DETECTED Final   Mycoplasma pneumoniae NOT DETECTED NOT DETECTED Final    Comment: Performed at [facility], [place]. [address] [place], [place] [address]    Procedures and diagnostic studies:  No results found.  Medications:   . arformoterol  15 mcg Nebulization BID  . aspirin EC  81 mg Oral Daily   . atorvastatin  40 mg Oral QHS  . budesonide (PULMICORT) nebulizer solution  0.5 mg Nebulization BID  . diltiazem  240 mg Oral Daily  . divalproex  1,000 mg Oral QHS  . enoxaparin (LOVENOX) injection  40 mg Subcutaneous Q24H  . hydrALAZINE  25 mg Oral Q8H  . insulin aspart  0-15 Units Subcutaneous TID WC  . insulin aspart  0-5 Units Subcutaneous QHS  .  insulin aspart  3 Units Subcutaneous TID WC  . insulin detemir  8 Units Subcutaneous Daily  . levalbuterol  0.63 mg Nebulization TID  . memantine  10 mg Oral BID  . methylPREDNISolone (SOLU-MEDROL) injection  80 mg Intravenous Q12H  . montelukast  10 mg Oral QHS  . multivitamin with minerals  1 tablet Oral Daily  . nebivolol  10 mg Oral Daily  . pantoprazole  40 mg Oral Daily  . revefenacin  175 mcg Nebulization Daily   Continuous Infusions:   LOS: 0 days   Radene Gunning NP  [facility]   How to contact the [facility] Attending or Consulting provider [place] or covering provider during after hours [place], for this patient?  1. Check the care team in [facility] and look for a) attending/consulting [facility] provider listed and b) the [facility] team listed 2. Log into [email or web address] and use [place]'s universal password to access. If you do not have the password, please contact the hospital operator. 3. Locate the [facility] provider you are looking for under [facility] and page to a number that you can be directly reached. 4. If you still have difficulty reaching the provider, please page the [facility] (Director on Call) for the Hospitalists listed on amion for assistance.  09/16/2019, 11:35 AM

## 2019-09-16 NOTE — Progress Notes (Signed)
Physical Therapy Treatment Patient Details Name: [name] MRN: [id number] DOB: 09/14/1950 Today's Date: 09/16/2019    History of Present Illness [name] is a 69 y.o. female.  Presents to the emergency department with chief complaint of shortness of breath. PMH includes asthma, CHF, CAD, depression, DM, NSTEMI, GERD.     PT Comments    Pt willing to participate in PT this day with SpO2 maintained in high 90s-100% on RA during all mobility. Pt continues to present with slow and steady gait, and requires cues to take rest breaks to recover dyspnea. Pt educated on the importance of rest breaks upon d/c from acute setting in order to recover breathing and fatigue, pt expresses understanding. PT also re-educated pt on deep diaphragmatic breathing with use of IS device. PT to continue to follow acutely.    Follow Up Recommendations  Home health PT     Equipment Recommendations  None recommended by PT    Recommendations for Other Services       Precautions / Restrictions Precautions Precautions: Fall Restrictions Weight Bearing Restrictions: No    Mobility  Bed Mobility Overal bed mobility: Modified Independent                Transfers Overall transfer level: Needs assistance Equipment used: None Transfers: Sit to/from Stand Sit to Stand: Modified independent (Device/Increase time)         General transfer comment: increased time to rise, no physical assist required.  Ambulation/Gait Ambulation/Gait assistance: Min guard Gait Distance (Feet): 150 Feet Assistive device: None Gait Pattern/deviations: Step-through pattern;Shuffle Gait velocity: decr   General Gait Details: for safety, very slow but steady gait this day, 2-minute seated rest break at 75 ft ambulation due to wheezing and DOE 3/4.   Stairs             Wheelchair Mobility    Modified Rankin (Stroke Patients Only)       Balance Overall balance assessment: Mild deficits  observed, not formally tested                                          Cognition Arousal/Alertness: Awake/alert Behavior During Therapy: WFL for tasks assessed/performed Overall Cognitive Status: Within Functional Limits for tasks assessed                                        Exercises Other Exercises Other Exercises: IS in seated position in bed, x7, with verbal and tactile cuing for deep diaphragmatic breathing    General Comments General comments (skin integrity, edema, etc.): SpO2 100% on RA during mobility, HR maintained in 90s during hallway mobility. Verbal cuing for breathing technique to recover dyspnea      Pertinent Vitals/Pain Pain Assessment: No/denies pain Pain Intervention(s): Limited activity within patient's tolerance;Monitored during session    Home Living                      Prior Function            PT Goals (current goals can now be found in the care plan section) Acute Rehab PT Goals Patient Stated Goal: to go home PT Goal Formulation: With patient Time For Goal Achievement: 09/28/19 Potential to Achieve Goals: Good Progress towards PT goals: Progressing toward goals  Frequency    Min 3X/week      PT Plan Current plan remains appropriate    Co-evaluation              AM-PAC PT "6 Clicks" Mobility   Outcome Measure  Help needed turning from your back to your side while in a flat bed without using bedrails?: None Help needed moving from lying on your back to sitting on the side of a flat bed without using bedrails?: None Help needed moving to and from a bed to a chair (including a wheelchair)?: None Help needed standing up from a chair using your arms (e.g., wheelchair or bedside chair)?: None Help needed to walk in hospital room?: A Little Help needed climbing 3-5 steps with a railing? : A Little 6 Click Score: 22    End of Session Equipment Utilized During Treatment: Gait  belt Activity Tolerance: Patient limited by fatigue Patient left: in bed;with call bell/phone within reach;Other (comment)(sitting EoB eating her lunch) Nurse Communication: Mobility status PT Visit Diagnosis: Difficulty in walking, not elsewhere classified (R26.2);Muscle weakness (generalized) (M62.81)     Time: [id number] PT Time Calculation (min) (ACUTE ONLY): 17 min  Charges:  $Gait Training: 8-22 mins                     Cora Brierley E, PT [place] Pager [phone]  Office [phone]   Kayleigh Broadwell D [place] 09/16/2019, 10:30 AM

## 2019-09-16 NOTE — Progress Notes (Signed)
Inpatient Diabetes Program Recommendations  AACE/ADA: New Consensus Statement on Inpatient Glycemic Control (2015)  Target Ranges:  Prepandial:   less than 140 mg/dL      Peak postprandial:   less than 180 mg/dL (1-2 hours)      Critically ill patients:  140 - 180 mg/dL   Lab Results  Component Value Date   GLUCAP 205 (H) 09/16/2019   HGBA1C 6.5 (H) 09/14/2019    Review of Glycemic Control Results for BRIGHTEN, WHITTAKER" (MRN [id number]) as of 09/16/2019 09:55  Ref. Range 09/15/2019 16:17 09/15/2019 22:23 09/16/2019 06:20  Glucose-Capillary Latest Ref Range: 70 - 99 mg/dL 198 (H) 275 (H) 205 (H)   Diabetes history: DM 2 Outpatient Diabetes medications: Metformin 500 mg bid Current orders for Inpatient glycemic control:  Novolog 0-15 units tid + hs Novolog 3 units tid meal coverage  Inpatient Diabetes Program Recommendations:    Solumedrol 80 mg Q12 hours started yesterday  -Consider Levemir 8 units  Thanks,  Tama Headings RN, MSN, BC-ADM Inpatient Diabetes Coordinator Team Pager [phone] (8a-5p)

## 2019-09-16 NOTE — Plan of Care (Signed)
  Problem: Clinical Measurements: Goal: Will remain free from infection Outcome: Progressing Goal: Respiratory complications will improve Outcome: Progressing   Problem: Nutrition: Goal: Adequate nutrition will be maintained Outcome: Progressing   

## 2019-09-17 LAB — CBC WITH DIFFERENTIAL/PLATELET
Abs Immature Granulocytes: 0.38 10*3/uL — ABNORMAL HIGH (ref 0.00–0.07)
Basophils Absolute: 0 10*3/uL (ref 0.0–0.1)
Basophils Relative: 0 %
Eosinophils Absolute: 0 10*3/uL (ref 0.0–0.5)
Eosinophils Relative: 0 %
HCT: 32.4 % — ABNORMAL LOW (ref 36.0–46.0)
Hemoglobin: 10.8 g/dL — ABNORMAL LOW (ref 12.0–15.0)
Immature Granulocytes: 3 %
Lymphocytes Relative: 12 %
Lymphs Abs: 1.8 10*3/uL (ref 0.7–4.0)
MCH: 31.9 pg (ref 26.0–34.0)
MCHC: 33.3 g/dL (ref 30.0–36.0)
MCV: 95.6 fL (ref 80.0–100.0)
Monocytes Absolute: 0.8 10*3/uL (ref 0.1–1.0)
Monocytes Relative: 5 %
Neutro Abs: 12.2 10*3/uL — ABNORMAL HIGH (ref 1.7–7.7)
Neutrophils Relative %: 80 %
Platelets: 268 10*3/uL (ref 150–400)
RBC: 3.39 MIL/uL — ABNORMAL LOW (ref 3.87–5.11)
RDW: 13.9 % (ref 11.5–15.5)
WBC: 15.2 10*3/uL — ABNORMAL HIGH (ref 4.0–10.5)
nRBC: 0.2 % (ref 0.0–0.2)

## 2019-09-17 LAB — GLUCOSE, CAPILLARY
Glucose-Capillary: 193 mg/dL — ABNORMAL HIGH (ref 70–99)
Glucose-Capillary: 260 mg/dL — ABNORMAL HIGH (ref 70–99)

## 2019-09-17 LAB — BASIC METABOLIC PANEL
Anion gap: 10 (ref 5–15)
BUN: 27 mg/dL — ABNORMAL HIGH (ref 8–23)
CO2: 25 mmol/L (ref 22–32)
Calcium: 9.1 mg/dL (ref 8.9–10.3)
Chloride: 100 mmol/L (ref 98–111)
Creatinine, Ser: 1.03 mg/dL — ABNORMAL HIGH (ref 0.44–1.00)
GFR calc Af Amer: >60 mL/min (ref >60)
GFR calc non Af Amer: 55 mL/min — ABNORMAL LOW (ref >60)
Glucose, Bld: 205 mg/dL — ABNORMAL HIGH (ref 70–99)
Potassium: 4.3 mmol/L (ref 3.5–5.1)
Sodium: 135 mmol/L (ref 135–145)

## 2019-09-17 MED ORDER — PREDNISONE 10 MG PO TABS
ORAL_TABLET | ORAL | 0 refills | Status: DC
Start: 2019-09-17 — End: 2020-01-14

## 2019-09-17 MED ORDER — REVEFENACIN 175 MCG/3ML IN SOLN: 175.0000 ug | Freq: Every day | RESPIRATORY_TRACT | 0 refills | Status: DC

## 2019-09-17 MED ORDER — MONTELUKAST SODIUM 10 MG PO TABS: 10.0000 mg | ORAL_TABLET | Freq: Every day | ORAL | 0 refills | Status: DC

## 2019-09-17 NOTE — Progress Notes (Signed)
Nsg Discharge Note  Admit Date:  09/13/2019 Discharge date: 09/17/2019   [name] to be D/C'd Home per MD order.  AVS completed.  Copy for chart, and copy for patient signed, and dated. Patient/caregiver able to verbalize understanding.  Discharge Medication: Allergies as of 09/17/2019   No Known Allergies     Medication List    TAKE these medications   acetaminophen-codeine 300-30 MG tablet Commonly known as: TYLENOL #3 Take 1 tablet by mouth every 6 (six) hours as needed for severe pain.   albuterol (2.5 MG/3ML) 0.083% nebulizer solution Commonly known as: PROVENTIL Take 2.5 mg by nebulization every 4 (four) hours as needed for wheezing or shortness of breath.   albuterol 108 (90 Base) MCG/ACT inhaler Commonly known as: ProAir HFA Inhale 2 puffs into the lungs every 4 (four) hours as needed for wheezing or shortness of breath.   ALPRAZolam 0.5 MG tablet Commonly known as: XANAX Take 1 tablet (0.5 mg total) by mouth 2 (two) times daily as needed for anxiety.   aspirin EC 81 MG tablet Take 81 mg by mouth daily.   atorvastatin 40 MG tablet Commonly known as: LIPITOR Take 40 mg by mouth at bedtime.   Biotin 5000 MCG Caps Take 1 capsule by mouth daily.   budesonide 0.5 MG/2ML nebulizer solution Commonly known as: PULMICORT USE 2 ML IN NEBULIZER  ONCE DAILY What changed: See the new instructions.   Chlor-Trimeton 4 MG tablet Generic drug: chlorpheniramine Take 4 mg by mouth 2 (two) times daily as needed for allergies or rhinitis.   Delsym 30 MG/5ML liquid Generic drug: dextromethorphan Take 60 mg by mouth daily as needed for cough. 2 tsp every 12 hours as needed w/ flutter   diltiazem 240 MG 24 hr capsule Commonly known as: Cartia XT Take 1 capsule (240 mg total) by mouth daily.   divalproex 500 MG 24 hr tablet Commonly known as: Depakote ER Take 2 tablets (1,000 mg total) by mouth at bedtime.   esomeprazole 20 MG capsule Commonly known as: NEXIUM Take  20 mg by mouth every morning.   Flutter Devi by Does not apply route.   hydrALAZINE 25 MG tablet Commonly known as: APRESOLINE TAKE 1 TABLET BY MOUTH TWICE DAILY What changed:   how much to take  how to take this  when to take this  additional instructions   memantine 10 MG tablet Commonly known as: NAMENDA Take 1 tablet (10 mg total) by mouth 2 (two) times daily.   metFORMIN 500 MG tablet Commonly known as: GLUCOPHAGE Take 500 mg by mouth 2 (two) times daily with a meal.   montelukast 10 MG tablet Commonly known as: SINGULAIR Take 1 tablet (10 mg total) by mouth at bedtime.   multivitamin with minerals Tabs tablet Take 1 tablet by mouth daily.   nebivolol 10 MG tablet Commonly known as: BYSTOLIC Take 1 tablet (10 mg total) by mouth daily.   nitroGLYCERIN 0.4 MG SL tablet Commonly known as: NITROSTAT DISSOLVE ONE TABLET UNDER THE TONGUE EVERY 5 MINUTES AS NEEDED FOR CHEST PAIN.  DO NOT EXCEED A TOTAL OF 3 DOSES IN 15 MINUTES What changed: See the new instructions.   Perforomist 20 MCG/2ML nebulizer solution Generic drug: formoterol Take 2 mLs (20 mcg total) by nebulization every morning. What changed: when to take this   potassium chloride SA 20 MEQ tablet Commonly known as: KLOR-CON Take 20 mEq by mouth daily.   predniSONE 10 MG tablet Commonly known as: DELTASONE Take  4 tabs for 3 days then take 2 tabs for 3 days then take 1 tab for 3 days then stop. What changed: additional instructions   revefenacin 175 MCG/3ML nebulizer solution Commonly known as: YUPELRI Take 3 mLs (175 mcg total) by nebulization daily. Start taking on: Sep 18, 2019   torsemide 20 MG tablet Commonly known as: DEMADEX Take 20 mg by mouth daily as needed (for swelling).       Discharge Assessment: Vitals:   09/17/19 0915 09/17/19 0918  BP:  (!) 150/68  Pulse: 82   Resp:    Temp:    SpO2:     Skin clean, dry and intact without evidence of skin break down, no evidence of  skin tears noted. IV catheter discontinued intact. Site without signs and symptoms of complications - no redness or edema noted at insertion site, patient denies c/o pain - only slight tenderness at site.  Dressing with slight pressure applied.  D/c Instructions-Education: Discharge instructions given to patient/family with verbalized understanding. D/c education completed with patient/family including follow up instructions, medication list, d/c activities limitations if indicated, with other d/c instructions as indicated by MD - patient able to verbalize understanding, all questions fully answered. Patient instructed to return to ED, call 911, or call MD for any changes in condition.  Patient escorted via [place], and D/C home via private auto.  Erasmo Leventhal, RN 09/17/2019 2:11 PM

## 2019-09-17 NOTE — Discharge Summary (Signed)
Physician Discharge Summary  [name] [id number] DOB: 08-Dec-1950 DOA: 09/13/2019  PCP: [name], MD  Admit date: 09/13/2019 Discharge date: 09/17/2019  Admitted From: home Discharge disposition: home   Recommendations for Outpatient Follow-Up:   #1.  Follow-up with Dr. Melvyn Novas 1-2 weeks for evaluation of symptoms.  #2. Take medications as prescribed #3. Follow up with PCP 2-3 weeks for evaluation of BP control  Discharge Diagnosis:   Principal Problem:   Asthma exacerbation Active Problems:   Essential hypertension   Severe persistent asthma without complication   Diabetes mellitus type 2, controlled (HCC)   Chronic diastolic CHF (congestive heart failure) ([place])   Upper airway cough syndrome with VCD   Wheezing    Discharge Condition: Improved.  Diet recommendation: Low sodium, heart healthy.  Carbohydrate-modified.    Wound care: None.  Code status: Full.   History of Present Illness:   [name] is a 69 y.o. female with medical history significant of chronic diastolic CHF, CAD, HTN, DM2,  h/o asthma, though Dr. Melvyn Novas thinks it may be more of an upper airway cough syndrome based on his office note from 4 days prior to presentation on 5/16.  He put her on prednisone taper.  Pt presented 5/16 to ED with severe SOB, wheezing, home nebs providing little relief.  Symptoms are severe, constant, nothing makes better or worse   Hospital Course by Problem:   #1. Asthma exacerbation.Patient with a history of severe persistent asthma without complication and UA cough syndrome. X-ray with no active cardiopulmonary disease. Oxygen saturation level greater than 90% on room air.  Evaluated by pulmonology who opined acute exacerbation of asthma/flare of small airway disease.  Recommended steroids scheduled nebs Singulair. Chart review indicates was evaluated by pulmonology in the outpatient setting 4 days prior. Note indicates pulmonology opined likely  more of an upper airway cough syndrome and prednisone taper was started.Presentedmore like upper airway cough syndrome. Provided with scheduled nebs, solumedrol and flutter valve. She was never hypoxic. She did demonstrate DOE.  On day of discharge respiratory effort much improved.  Good air movement.  Faint diffuse rhonchi with very little end expiratory wheezing.  Oxygen saturation level remains greater than 90% on room air.  Will discharge with prednisone taper and continue home nebulizers.  #2. Hypertension.   Blood pressure high end of normal.  Home medications include diltiazem, hydralazine bystolic.  continue home meds. Follow up with PCP 2-3 weeks for evaluation of BP control.   #3. Diabetes type 2. Home medications include Metformin.   HgA1c 6.5. CBG's elevated somewhat due to steroids. Will correct as steroids are tapered  #4. CHF. Compensated. Echo done 2019 revealed an EF of 65 to 70% with normal wall motion grade 2 diastolic dysfunction. Home medications include Demadex as needed.     Medical Consultants:   [facility]   Discharge Exam:   Vitals:   09/17/19 0915 09/17/19 0918  BP:  (!) 150/68  Pulse: 82   Resp:    Temp:    SpO2:     Vitals:   09/17/19 0548 09/17/19 0756 09/17/19 0915 09/17/19 0918  BP: (!) 168/78   (!) 150/68  Pulse: 77  82   Resp: 16     Temp: 98.1 F (36.7 C)     TempSrc: Oral     SpO2: 96% 92%    Weight:      Height:        General exam: Appears calm and  comfortable. Sitting up in bed watching TV Respiratory system: No increased work of breathing with conversation.  Breath sounds with mild diffuse rhonchi and faint end expiratory wheeze good air movement Cardiovascular system: S1 & S2 heard, RRR. No JVD,  rubs, gallops or clicks. No murmurs. Gastrointestinal system: Abdomen is nondistended, soft and nontender. No organomegaly or masses felt. Normal bowel sounds heard. Central nervous system: Alert and oriented. No focal  neurological deficits. Extremities: No clubbing,  or cyanosis. No edema. Skin: No rashes, lesions or ulcers. Psychiatry: Judgement and insight appear normal. Mood & affect appropriate.    The results of significant diagnostics from this hospitalization (including imaging, microbiology, ancillary and laboratory) are listed below for reference.     Procedures and Diagnostic Studies:   DG Chest 2 View  Result Date: 09/13/2019 CLINICAL DATA:  Asthma, short of breath EXAM: CHEST - 2 VIEW COMPARISON:  12/02/2018 FINDINGS: The heart size and mediastinal contours are within normal limits. Both lungs are clear. The visualized skeletal structures are unremarkable. IMPRESSION: No active cardiopulmonary disease. Electronically Signed   By: Donavan Foil M.D.   On: 09/13/2019 19:50     Labs:   Basic Metabolic Panel: Recent Labs  Lab 09/13/19 1920 09/13/19 1920 09/14/19 0557 09/14/19 0557 09/15/19 0204 09/15/19 0204 09/16/19 0339 09/17/19 0227  NA 142  --  141  --  137  --  135 135  K 4.2   < > 3.3*   < > 4.0   < > 5.1 4.3  CL 102  --  100  --  103  --  102 100  CO2 26  --  22  --  25  --  22 25  GLUCOSE 166*  --  307*  --  204*  --  232* 205*  BUN 23  --  23  --  23  --  28* 27*  CREATININE 1.31*  --  1.50*  --  1.19*  --  1.05* 1.03*  CALCIUM 9.1  --  9.5  --  9.3  --  9.2 9.1   < > = values in this interval not displayed.   GFR Estimated Creatinine Clearance: 49.4 mL/min (A) (by C-G formula based on SCr of 1.03 mg/dL (H)). Liver Function Tests: No results for input(s): AST, ALT, ALKPHOS, BILITOT, PROT, ALBUMIN in the last 168 hours. No results for input(s): LIPASE, AMYLASE in the last 168 hours. No results for input(s): AMMONIA in the last 168 hours. Coagulation profile No results for input(s): INR, PROTIME in the last 168 hours.  CBC: Recent Labs  Lab 09/13/19 1920 09/14/19 0557 09/15/19 0204 09/16/19 0339 09/17/19 0227  WBC 9.4 12.4* 14.6* 13.1* 15.2*  NEUTROABS  --    --  11.4* 10.9* 12.2*  HGB 11.5* 11.1* 10.5* 11.2* 10.8*  HCT 35.4* 35.2* 32.0* 33.8* 32.4*  MCV 97.3 99.7 96.4 96.3 95.6  PLT 280 258 242 264 268   Cardiac Enzymes: No results for input(s): CKTOTAL, CKMB, CKMBINDEX, TROPONINI in the last 168 hours. BNP: Invalid input(s): POCBNP CBG: Recent Labs  Lab 09/16/19 1140 09/16/19 1620 09/16/19 2117 09/17/19 0625 09/17/19 1107  GLUCAP 296* 170* 190* 193* 260*   D-Dimer No results for input(s): DDIMER in the last 72 hours. Hgb A1c No results for input(s): HGBA1C in the last 72 hours. Lipid Profile No results for input(s): CHOL, HDL, LDLCALC, TRIG, CHOLHDL, LDLDIRECT in the last 72 hours. Thyroid function studies No results for input(s): TSH, T4TOTAL, T3FREE, THYROIDAB in the last 72  hours.  Invalid input(s): FREET3 Anemia work up No results for input(s): VITAMINB12, FOLATE, FERRITIN, TIBC, IRON, RETICCTPCT in the last 72 hours. Microbiology Recent Results (from the past 240 hour(s))  SARS Coronavirus 2 by RT PCR (hospital order, performed in [facility] hospital lab) Nasopharyngeal Nasopharyngeal Swab     Status: None   Collection Time: 09/14/19  1:44 AM   Specimen: Nasopharyngeal Swab  Result Value Ref Range Status   SARS Coronavirus 2 NEGATIVE NEGATIVE Final    Comment: (NOTE) SARS-CoV-2 target nucleic acids are NOT DETECTED. The SARS-CoV-2 RNA is generally detectable in upper and lower respiratory specimens during the acute phase of infection. The lowest concentration of SARS-CoV-2 viral copies this assay can detect is 250 copies / mL. A negative result does not preclude SARS-CoV-2 infection and should not be used as the sole basis for treatment or other patient management decisions.  A negative result may occur with improper specimen collection / handling, submission of specimen other than nasopharyngeal swab, presence of viral mutation(s) within the areas targeted by this assay, and inadequate number of viral copies  (<250 copies / mL). A negative result must be combined with clinical observations, patient history, and epidemiological information. Fact Sheet for Patients:   StrictlyIdeas.no Fact Sheet for Healthcare Providers: BankingDealers.co.za This test is not yet approved or cleared  by the [place] FDA and has been authorized for detection and/or diagnosis of SARS-CoV-2 by FDA under an Emergency Use Authorization (EUA).  This EUA will remain in effect (meaning this test can be used) for the duration of the COVID-19 declaration under Section 564(b)(1) of the Act, 21 U.S.C. section 360bbb-3(b)(1), unless the authorization is terminated or revoked sooner. Performed at [facility], [place] [address], [place], [place] [address]   Respiratory Panel by PCR     Status: Abnormal   Collection Time: 09/14/19  2:35 PM   Specimen: Nasopharyngeal Swab; Respiratory  Result Value Ref Range Status   Adenovirus NOT DETECTED NOT DETECTED Final   Coronavirus 229E NOT DETECTED NOT DETECTED Final    Comment: (NOTE) The Coronavirus on the Respiratory Panel, DOES NOT test for the novel  Coronavirus (2019 nCoV)    Coronavirus HKU1 NOT DETECTED NOT DETECTED Final   Coronavirus NL63 NOT DETECTED NOT DETECTED Final   Coronavirus OC43 NOT DETECTED NOT DETECTED Final   Metapneumovirus NOT DETECTED NOT DETECTED Final   Rhinovirus / Enterovirus DETECTED (A) NOT DETECTED Final   Influenza A NOT DETECTED NOT DETECTED Final   Influenza B NOT DETECTED NOT DETECTED Final   Parainfluenza Virus 1 NOT DETECTED NOT DETECTED Final   Parainfluenza Virus 2 NOT DETECTED NOT DETECTED Final   Parainfluenza Virus 3 NOT DETECTED NOT DETECTED Final   Parainfluenza Virus 4 NOT DETECTED NOT DETECTED Final   Respiratory Syncytial Virus NOT DETECTED NOT DETECTED Final   Bordetella pertussis NOT DETECTED NOT DETECTED Final   Chlamydophila pneumoniae NOT DETECTED NOT DETECTED Final    Mycoplasma pneumoniae NOT DETECTED NOT DETECTED Final    Comment: Performed at [facility], [place]. [address] [place], [place] [address]     Discharge Instructions:   Discharge Instructions    Diet - low sodium heart healthy   Complete by: As directed    Discharge instructions   Complete by: As directed    Take medications as prescribed Follow up with Dr. Melvyn Novas in 1-2 weeks for evaluation of respiratory status   Increase activity slowly   Complete by: As directed  Allergies as of 09/17/2019   No Known Allergies     Medication List    TAKE these medications   acetaminophen-codeine 300-30 MG tablet Commonly known as: TYLENOL #3 Take 1 tablet by mouth every 6 (six) hours as needed for severe pain.   albuterol (2.5 MG/3ML) 0.083% nebulizer solution Commonly known as: PROVENTIL Take 2.5 mg by nebulization every 4 (four) hours as needed for wheezing or shortness of breath.   albuterol 108 (90 Base) MCG/ACT inhaler Commonly known as: ProAir HFA Inhale 2 puffs into the lungs every 4 (four) hours as needed for wheezing or shortness of breath.   ALPRAZolam 0.5 MG tablet Commonly known as: XANAX Take 1 tablet (0.5 mg total) by mouth 2 (two) times daily as needed for anxiety.   aspirin EC 81 MG tablet Take 81 mg by mouth daily.   atorvastatin 40 MG tablet Commonly known as: LIPITOR Take 40 mg by mouth at bedtime.   Biotin 5000 MCG Caps Take 1 capsule by mouth daily.   budesonide 0.5 MG/2ML nebulizer solution Commonly known as: PULMICORT USE 2 ML IN NEBULIZER  ONCE DAILY What changed: See the new instructions.   Chlor-Trimeton 4 MG tablet Generic drug: chlorpheniramine Take 4 mg by mouth 2 (two) times daily as needed for allergies or rhinitis.   Delsym 30 MG/5ML liquid Generic drug: dextromethorphan Take 60 mg by mouth daily as needed for cough. 2 tsp every 12 hours as needed w/ flutter   diltiazem 240 MG 24 hr capsule Commonly known as: Cartia XT Take 1  capsule (240 mg total) by mouth daily.   divalproex 500 MG 24 hr tablet Commonly known as: Depakote ER Take 2 tablets (1,000 mg total) by mouth at bedtime.   esomeprazole 20 MG capsule Commonly known as: NEXIUM Take 20 mg by mouth every morning.   Flutter Devi by Does not apply route.   hydrALAZINE 25 MG tablet Commonly known as: APRESOLINE TAKE 1 TABLET BY MOUTH TWICE DAILY What changed:   how much to take  how to take this  when to take this  additional instructions   memantine 10 MG tablet Commonly known as: NAMENDA Take 1 tablet (10 mg total) by mouth 2 (two) times daily.   metFORMIN 500 MG tablet Commonly known as: GLUCOPHAGE Take 500 mg by mouth 2 (two) times daily with a meal.   montelukast 10 MG tablet Commonly known as: SINGULAIR Take 1 tablet (10 mg total) by mouth at bedtime.   multivitamin with minerals Tabs tablet Take 1 tablet by mouth daily.   nebivolol 10 MG tablet Commonly known as: BYSTOLIC Take 1 tablet (10 mg total) by mouth daily.   nitroGLYCERIN 0.4 MG SL tablet Commonly known as: NITROSTAT DISSOLVE ONE TABLET UNDER THE TONGUE EVERY 5 MINUTES AS NEEDED FOR CHEST PAIN.  DO NOT EXCEED A TOTAL OF 3 DOSES IN 15 MINUTES What changed: See the new instructions.   Perforomist 20 MCG/2ML nebulizer solution Generic drug: formoterol Take 2 mLs (20 mcg total) by nebulization every morning. What changed: when to take this   potassium chloride SA 20 MEQ tablet Commonly known as: KLOR-CON Take 20 mEq by mouth daily.   predniSONE 10 MG tablet Commonly known as: DELTASONE Take 4 tabs for 3 days then take 2 tabs for 3 days then take 1 tab for 3 days then stop. What changed: additional instructions   revefenacin 175 MCG/3ML nebulizer solution Commonly known as: YUPELRI Take 3 mLs (175 mcg total) by nebulization daily.  Start taking on: Sep 18, 2019   torsemide 20 MG tablet Commonly known as: DEMADEX Take 20 mg by mouth daily as needed (for  swelling).      Follow-up Information    [facility] Follow up.   Specialty: Home Health Services Contact information: [place] [address] [phone]            Time coordinating discharge: 80  Signed:  Radene Gunning NP  [facility] 09/17/2019, 11:27 AM

## 2019-09-17 NOTE — Progress Notes (Signed)
PCCM INTERVAL PROGRESS NOTE  Patient for discharge today. PCCM will sign off. Please arrange follow up with Dr. Melvyn Novas upon discharge.   Please call if additional recommendations required.   Georgann Housekeeper, AGACNP-BC [place]  See Amion for personal pager PCCM on call pager [phone]  09/17/2019 11:43 AM

## 2019-09-18 ENCOUNTER — Telehealth: Payer: Self-pay | Admitting: *Deleted

## 2019-09-18 NOTE — Telephone Encounter (Signed)
Pt called reschedule her appt, she just got out of the hospital.

## 2019-09-19 ENCOUNTER — Ambulatory Visit: Payer: Medicare Other | Admitting: Podiatry

## 2019-09-25 ENCOUNTER — Telehealth: Payer: Self-pay | Admitting: Podiatry

## 2019-09-25 NOTE — Telephone Encounter (Signed)
lvm for patient to call back and r/s appointment due to he being in the hospital

## 2019-10-03 ENCOUNTER — Ambulatory Visit (INDEPENDENT_AMBULATORY_CARE_PROVIDER_SITE_OTHER): Payer: Medicare Other | Admitting: Pulmonary Disease

## 2019-10-03 ENCOUNTER — Encounter: Payer: Self-pay | Admitting: Pulmonary Disease

## 2019-10-03 ENCOUNTER — Other Ambulatory Visit: Payer: Self-pay

## 2019-10-03 VITALS — BP 134/70 | HR 69 | Temp 99.2°F | Ht 61.0 in | Wt 152.4 lb

## 2019-10-03 DIAGNOSIS — J301 Allergic rhinitis due to pollen: Secondary | ICD-10-CM

## 2019-10-03 DIAGNOSIS — J4550 Severe persistent asthma, uncomplicated: Secondary | ICD-10-CM

## 2019-10-03 MED ORDER — MONTELUKAST SODIUM 10 MG PO TABS: 10.0000 mg | ORAL_TABLET | Freq: Every day | ORAL | 3 refills | Status: DC

## 2019-10-03 NOTE — Assessment & Plan Note (Signed)
Plan: Continue Perforomist Continue Pulmicort Continue Yupelri Follow-up with our office in 3 months Continue Singulair Start nasal saline rinses when outside for long periods of time Can use chlor tabs at night to help with postnasal drip

## 2019-10-03 NOTE — Assessment & Plan Note (Signed)
Plan: Continue Singulair Ensure patient is on daily antihistamine Start nasal saline rinses

## 2019-10-03 NOTE — Progress Notes (Signed)
@Patient  ID: [name], female    DOB: 04-20-1951, 69 y.o.   MRN: [id number]  Chief Complaint  Patient presents with  . Follow-up    Asthma, hospital follow-up    Referring provider: [name], MD  HPI:  69 year old female former smoker followed in our office for asthma, upper airway cough, vocal cord dysfunction  PMH: Hypertension, type 2 diabetes, chronic diastolic heart failure, GERD, history of PE, history non-STEMI, hyperlipidemia, chronic kidney disease stage III Smoker/ Smoking History: Former smoker.  Quit 2005.  30-pack-year smoking history Maintenance: Yupelri, Perforomist, Pulmicort Pt of: Dr. Melvyn Novas  10/03/2019  - Visit   69 year old female former smoker followed in our office for upper airway cough syndrome and asthma.  Patient was last seen in our office on 09/09/2019 at that time she was prescribed prednisone.  She then presented to the hospital on 09/13/2019 where she was hospitalized for a exacerbation of her asthma.  She was discharged on 09/17/2019 and excerpt of her discharge summary is listed below:  #1. Asthma exacerbation.Patient with a history of severe persistent asthma without complication and UA cough syndrome. X-ray with no active cardiopulmonary disease. Oxygen saturation level greater than 90% on room air.  Evaluated by pulmonology who opined acute exacerbation of asthma/flare of small airway disease.  Recommended steroids scheduled nebs Singulair. Chart review indicates was evaluated by pulmonology in the outpatient setting 4 days prior. Note indicates pulmonology opined likely more of an upper airway cough syndrome and prednisone taper was started.Presentedmore like upper airway cough syndrome. Provided with scheduled nebs, solumedrol and flutter valve. She was never hypoxic. She did demonstrate DOE.  On day of discharge respiratory effort much improved.  Good air movement.  Faint diffuse rhonchi with very little end expiratory wheezing.  Oxygen  saturation level remains greater than 90% on room air.  Will discharge with prednisone taper and continue home nebulizers.  #2. Hypertension.Blood pressure high end of normal. Home medications include diltiazem, hydralazine bystolic.continue home meds. Follow up with PCP 2-3 weeks for evaluation of BP control.   #3. Diabetes type 2. Home medications include Metformin. HgA1c 6.5. CBG's elevated somewhat due to steroids. Will correct as steroids are tapered  #4. CHF. Compensated. Echo done 2019 revealed an EF of 65 to 70% with normal wall motion grade 2 diastolic dysfunction. Home medications include Demadex as needed.   Patient reporting that she is doing well since being discharged from the hospital.  She reports adherence to her Pulmicort, Perforomist and Yupelri.  Patient reports that she is having to take her Demadex daily.  She has follow-up with primary care.  She does have some congestion with her cough.  She reports difficulty bringing the congestion up.  She reports this is been an ongoing chronic issue.  She finds that when she uses Tylenol 3 this helps with cough suppression.  She also feels that she may have used Delsym in the past which may have also helped.  We will discuss this today.   Questionaires / Pulmonary Flowsheets:   ACT:  Asthma Control Test ACT Total Score  10/03/2019 13    MMRC: No flowsheet data found.  [facility]:  No flowsheet data found.  Tests:   09/13/2019-chest x-ray-no active cardiopulmonary disease  04/18/2018-pulmonary function test-FVC 1.19 (55% predicted), postbronchodilator ratio 91, postbronchodilator FEV1 1.13 (68% predicted), no bronchodilator response, DLCO 11.21 (53% predicted   FENO:  Lab Results  Component Value Date   NITRICOXIDE 18 11/16/2017    PFT: PFT Results Latest  Ref Rng & Units 04/18/2018  FVC-Pre L 1.19  FVC-Predicted Pre % 55  FVC-Post L 1.25  FVC-Predicted Post % 58  Pre FEV1/FVC % % 89  Post FEV1/FCV  % % 91  FEV1-Pre L 1.05  FEV1-Predicted Pre % 64  FEV1-Post L 1.13  DLCO UNC% % 53  DLCO COR %Predicted % 147  TLC L 2.73  TLC % Predicted % 58  RV % Predicted % 64    WALK:  SIX MIN WALK 12/28/2017 09/18/2016  Supplimental Oxygen during Test? (L/min) No No  Tech Comments: Patient did have to stop once during walk, but only for a few seconds. Was able to maintain a normal pace.  fast pace/min SOB//lmr    Imaging: DG Chest 2 View  Result Date: 09/13/2019 CLINICAL DATA:  Asthma, short of breath EXAM: CHEST - 2 VIEW COMPARISON:  12/02/2018 FINDINGS: The heart size and mediastinal contours are within normal limits. Both lungs are clear. The visualized skeletal structures are unremarkable. IMPRESSION: No active cardiopulmonary disease. Electronically Signed   By: Donavan Foil M.D.   On: 09/13/2019 19:50    Lab Results:  CBC    Component Value Date/Time   WBC 15.2 (H) 09/17/2019 0227   RBC 3.39 (L) 09/17/2019 0227   HGB 10.8 (L) 09/17/2019 0227   HGB 11.2 09/17/2017 1550   HCT 32.4 (L) 09/17/2019 0227   HCT 34.2 09/17/2017 1550   PLT 268 09/17/2019 0227   PLT 338 09/17/2017 1550   MCV 95.6 09/17/2019 0227   MCV 95 09/17/2017 1550   MCH 31.9 09/17/2019 0227   MCHC 33.3 09/17/2019 0227   RDW 13.9 09/17/2019 0227   RDW 14.4 09/17/2017 1550   LYMPHSABS 1.8 09/17/2019 0227   LYMPHSABS 2.2 05/17/2017 1141   MONOABS 0.8 09/17/2019 0227   EOSABS 0.0 09/17/2019 0227   EOSABS 0.1 05/17/2017 1141   BASOSABS 0.0 09/17/2019 0227   BASOSABS 0.1 05/17/2017 1141    BMET    Component Value Date/Time   NA 135 09/17/2019 0227   NA 142 02/22/2018 1103   K 4.3 09/17/2019 0227   CL 100 09/17/2019 0227   CO2 25 09/17/2019 0227   GLUCOSE 205 (H) 09/17/2019 0227   BUN 27 (H) 09/17/2019 0227   BUN 15 02/22/2018 1103   CREATININE 1.03 (H) 09/17/2019 0227   CREATININE 1.06 (H) 01/26/2016 1608   CALCIUM 9.1 09/17/2019 0227   GFRNONAA 55 (L) 09/17/2019 0227   GFRAA >60 09/17/2019 0227     BNP    Component Value Date/Time   BNP 129.2 (H) 03/16/2018 0924   BNP <4.0 01/26/2016 1608    ProBNP    Component Value Date/Time   PROBNP 13.0 02/23/2016 1122    Specialty Problems      Pulmonary Problems   Mild persistent chronic asthma without complication     Onset of symptoms reported  2012 p quit smoking  2005  - FEN0 01/24/2016 = 13 with active symptoms on symb 160 2bid > changed to dulera 100  - Spirometry 02/23/2016  NO signifiant obstruction with active symptoms  - 02/23/2016  After extensive coaching HFA effectiveness =    75%  - FENO 02/23/2016  =  12   On dulera 100 2bid (unable to confirm adeherence as did not bring it - Singulair 10 mg daily added 02/23/2016 to dulera 100 2bid with 2 week sample only  (unable to verify she used it) - Allergy profile 02/23/16  >  Eos 0.0 /  IgE  87 pos  RAST  Grass/ trees  - 04/07/2016   symb 80 x one sample given - FENO 04/07/2016  =   43 ? Really on meds ?  - 05/08/2016  After extensive coaching HFA effectiveness =    75% > continue symb 80 2bid - 07/17/2016 added singulair maint  - FENO 09/18/2016  =   15 on symb 160 2bid > try 80 2bid  - flare late feb 2019 ? What maint rx she was taking but on admit "non-adherent per notes 08/12/17  - 09/18/2017  After extensive coaching inhaler device  effectiveness =    75% from a baseline of 50% > restart symb 80 2bid - FENO 09/18/2017  =   20 on symb 160  - Spirometry 09/18/2017  No obstruction at all with active wheeze -  09/09/2019 added prednisone as "plan D"          Allergic rhinitis    IgE 96, normal EOS  RAST pos grass, dust, tree  Continue Singulair Add Zyrtec 12/17/17       DYSPNEA    Qualifier: Diagnosis of  By: Gwenette Greet MD, Armando Reichert       OTHER DISEASES OF VOCAL CORDS    Qualifier: Diagnosis of  By: Gwenette Greet MD, Armando Reichert       Upper airway cough syndrome with VCD    - CT sinus 08/06/14 > neg  - Allergy profile 02/23/16  >  Eos 0.0 /  IgE  87 pos  RAST  Grass/ trees  -  improved 05/08/2016 on gabapentin 300 tid > no change rx > d/c'd 07/2016 DgEs 10/09/17  Low range of penetration without aspiration. Otherwise, normal - Asthma exac 12/17/17- FENO 18. Significant upper airway wheeze, tx pred taper. Add neurontin 100mg  TID.  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 > rec max  gerd rx   - 04/05/2018 rechallenge with gabapentin 300  Qid  - PFT's  04/18/2018  FEV1 1.13 (68 % ) ratio 91  p 7 % improvement from saba p nothing prior to study with DLCO  53/60 % corrects to 147  % for alv volume  s curvature - 02/04/2019 informed only using gabapentin 300 mg hs so ok to d/c           Asthma exacerbation   Dyspnea on exertion    09/18/2016  Walked RA x 3 laps @ 185 ft each stopped due to  End of study, fast pace, min sob,  No  desat   - Echo 08/13/17 Left ventricle: The cavity size was normal. There was moderate   concentric hypertrophy. Systolic function was vigorous. The   estimated ejection fraction was in the range of 65% to 70%. Wall   motion was normal; there were no regional wall motion   abnormalities. Features are consistent with a pseudonormal left   ventricular filling pattern, with concomitant abnormal relaxation   and increased filling pressure (grade 2 diastolic dysfunction).   Doppler parameters are consistent with high ventricular filling   pressure. - Mitral valve:  There was trivial regurgitation. - Pulmonary arteries: PA peak pressure: 34 mm Hg (S).  - 12/28/2017  Walked RA x 3 laps @ 185 ft each stopped due to  Sob but walked at nl  pace, no desat       Acute bronchitis with asthma with acute  exacerbation   Acute respiratory failure (HCC)   Acute respiratory failure with hypoxia (HCC)   Influenza B   Lung nodule    CT chest 06/2015 4 mm RUL nodule >repeat CT chest 08/01/2016.       Dyspnea   Severe persistent asthma without complication   Wheezing      No Known Allergies  Immunization History  Administered Date(s) Administered  . Influenza Split 03/10/2012, 03/14/2015  . Influenza, High Dose Seasonal PF 02/04/2017, 01/04/2018, 12/31/2018  . Influenza-Unspecified 01/11/2016, 01/07/2019  . PFIZER SARS-COV-2 Vaccination 05/16/2019, 06/16/2019  . Zoster Recombinat (Shingrix) 06/11/2017, 08/28/2017, 09/27/2017    Past Medical History:  Diagnosis Date  . Anxiety   . Asthma   . Bronchitis   . Chronic diastolic CHF (congestive heart failure) ([place])   . Chronic diastolic CHF (congestive heart failure) ([place]) 07/17/2016  . Coronary artery disease    a. NSTEMI 12/2015 - LHC 01/18/16: s/p overlapping DESx2 to RCA, 10% ost-prox Cx,10% mLAD.  Marland Kitchen Depression   . Diabetes mellitus   . Dyslipidemia   . GERD (gastroesophageal reflux disease)   . Heart attack ([place])   . Hypertension   . Hypertensive heart disease   . NSTEMI (non-ST elevated myocardial infarction) ([place]) 12/2015  . Pneumonia     Tobacco History: Social History   Tobacco Use  Smoking Status Former Smoker  . Packs/day: 1.00  . Years: 30.00  . Pack years: 30.00  . Types: Cigarettes  . Quit date: 01/09/2004  . Years since quitting: 15.7  Smokeless Tobacco Never Used   Counseling given: Not Answered   Continue to not smoke  Outpatient Encounter Medications as of 10/03/2019  Medication Sig  . acetaminophen-codeine (TYLENOL #3) 300-30 MG tablet Take 1 tablet by mouth every 6 (six) hours as needed for severe pain.   Marland Kitchen albuterol (PROAIR HFA) 108 (90 Base) MCG/ACT inhaler Inhale 2 puffs into the lungs every 4 (four) hours as needed for wheezing or shortness of breath.  Marland Kitchen albuterol (PROVENTIL) (2.5 MG/3ML) 0.083%  nebulizer solution Take 2.5 mg by nebulization every 4 (four) hours as needed for wheezing or shortness of breath.   . ALPRAZolam (XANAX) 0.5 MG tablet Take 1 tablet (0.5 mg total) by mouth 2 (two) times daily as needed for anxiety.  Marland Kitchen aspirin EC 81 MG tablet Take 81 mg by mouth daily.   Marland Kitchen atorvastatin (LIPITOR) 40 MG tablet Take 40 mg by mouth at bedtime.  . Biotin 5000 MCG CAPS Take 1 capsule by mouth daily.  . budesonide (PULMICORT) 0.5 MG/2ML nebulizer solution USE 2 ML IN NEBULIZER  ONCE DAILY (Patient taking differently: Take 0.5 mg by nebulization daily. )  . chlorpheniramine (CHLOR-TRIMETON) 4 MG tablet Take 4 mg by mouth 2 (two) times daily as needed for allergies or rhinitis.   Marland Kitchen dextromethorphan (DELSYM) 30 MG/5ML liquid Take 60 mg by mouth daily as needed for cough. 2 tsp every 12 hours as needed w/ flutter   . diltiazem (CARTIA XT) 240 MG 24 hr capsule Take 1 capsule (240 mg total) by mouth daily.  . divalproex (DEPAKOTE ER) 500 MG 24 hr tablet Take 2 tablets (1,000 mg total) by mouth at bedtime.  Marland Kitchen  esomeprazole (NEXIUM) 20 MG capsule Take 20 mg by mouth every morning.   . formoterol (PERFOROMIST) 20 MCG/2ML nebulizer solution Take 2 mLs (20 mcg total) by nebulization every morning. (Patient taking differently: Take 20 mcg by nebulization daily. )  . hydrALAZINE (APRESOLINE) 25 MG tablet TAKE 1 TABLET BY MOUTH TWICE DAILY (Patient taking differently: Take 25 mg by mouth in the morning and at bedtime. )  . memantine (NAMENDA) 10 MG tablet Take 1 tablet (10 mg total) by mouth 2 (two) times daily.  . metFORMIN (GLUCOPHAGE) 500 MG tablet Take 500 mg by mouth 2 (two) times daily with a meal.  . montelukast (SINGULAIR) 10 MG tablet Take 1 tablet (10 mg total) by mouth at bedtime.  . Multiple Vitamin (MULTIVITAMIN WITH MINERALS) TABS tablet Take 1 tablet by mouth daily.  . nebivolol (BYSTOLIC) 10 MG tablet Take 1 tablet (10 mg total) by mouth daily.  . nitroGLYCERIN (NITROSTAT) 0.4 MG SL  tablet DISSOLVE ONE TABLET UNDER THE TONGUE EVERY 5 MINUTES AS NEEDED FOR CHEST PAIN.  DO NOT EXCEED A TOTAL OF 3 DOSES IN 15 MINUTES (Patient taking differently: Place 0.4 mg under the tongue every 5 (five) minutes as needed. )  . potassium chloride SA (K-DUR,KLOR-CON) 20 MEQ tablet Take 20 mEq by mouth daily.   . predniSONE (DELTASONE) 10 MG tablet Take 4 tabs for 3 days then take 2 tabs for 3 days then take 1 tab for 3 days then stop.  Marland Kitchen Respiratory Therapy Supplies (FLUTTER) DEVI by Does not apply route.  . revefenacin (YUPELRI) 175 MCG/3ML nebulizer solution Take 3 mLs (175 mcg total) by nebulization daily.  Marland Kitchen torsemide (DEMADEX) 20 MG tablet Take 20 mg by mouth daily as needed (for swelling).   . [DISCONTINUED] montelukast (SINGULAIR) 10 MG tablet Take 1 tablet (10 mg total) by mouth at bedtime.   No facility-administered encounter medications on file as of 10/03/2019.     Review of Systems  Review of Systems  Constitutional: Negative for activity change, fatigue and fever.  HENT: Positive for rhinorrhea. Negative for sinus pressure, sinus pain and sore throat.   Respiratory: Positive for cough. Negative for shortness of breath and wheezing.   Cardiovascular: Negative for chest pain and palpitations.  Gastrointestinal: Negative for diarrhea, nausea and vomiting.  Musculoskeletal: Negative for arthralgias.  Neurological: Negative for dizziness.  Psychiatric/Behavioral: Negative for sleep disturbance. The patient is not nervous/anxious.      Physical Exam  BP 134/70 (BP Location: Left Arm, Cuff Size: Normal)   Pulse 69   Temp 99.2 F (37.3 C) (Oral)   Ht 5\' 1"  (1.549 m)   Wt 152 lb 6.4 oz (69.1 kg)   LMP  (LMP Unknown)   SpO2 100%   BMI 28.80 kg/m   Wt Readings from Last 5 Encounters:  10/03/19 152 lb 6.4 oz (69.1 kg)  09/17/19 144 lb 2.9 oz (65.4 kg)  09/09/19 147 lb 6.4 oz (66.9 kg)  08/05/19 147 lb 12.8 oz (67 kg)  06/05/19 151 lb 3.2 oz (68.6 kg)    BMI Readings  from Last 5 Encounters:  10/03/19 28.80 kg/m  09/17/19 22.58 kg/m  09/09/19 26.96 kg/m  08/05/19 27.93 kg/m  06/05/19 27.65 kg/m     Physical Exam Vitals and nursing note reviewed.  Constitutional:      General: She is not in acute distress.    Appearance: Normal appearance. She is normal weight.  HENT:     Head: Normocephalic and atraumatic.  Right Ear: Tympanic membrane, ear canal and external ear normal. There is impacted cerumen.     Left Ear: Tympanic membrane, ear canal and external ear normal. There is impacted cerumen.     Nose: Nose normal. No congestion or rhinorrhea.     Mouth/Throat:     Mouth: Mucous membranes are moist.     Pharynx: Oropharynx is clear.  Eyes:     Pupils: Pupils are equal, round, and reactive to light.  Cardiovascular:     Rate and Rhythm: Normal rate and regular rhythm.     Pulses: Normal pulses.     Heart sounds: Normal heart sounds. No murmur.  Pulmonary:     Effort: Pulmonary effort is normal.     Breath sounds: Normal breath sounds. No decreased air movement. No decreased breath sounds, wheezing or rales.  Musculoskeletal:     Cervical back: Normal range of motion.     Right lower leg: No edema.     Left lower leg: No edema.  Skin:    General: Skin is warm and dry.     Capillary Refill: Capillary refill takes less than 2 seconds.  Neurological:     General: No focal deficit present.     Mental Status: She is alert and oriented to person, place, and time. Mental status is at baseline.     Gait: Gait normal.  Psychiatric:        Mood and Affect: Mood normal.        Behavior: Behavior normal.        Thought Content: Thought content normal.        Judgment: Judgment normal.       Assessment & Plan:   Severe persistent asthma without complication Plan: Continue Perforomist Continue Pulmicort Continue Yupelri Follow-up with our office in 3 months Continue Singulair Start nasal saline rinses when outside for long  periods of time Can use chlor tabs at night to help with postnasal drip  Allergic rhinitis Plan: Continue Singulair Ensure patient is on daily antihistamine Start nasal saline rinses    Return in about 3 months (around 01/03/2020), or if symptoms worsen or fail to improve, for Follow up with Dr. Melvyn Novas.   Lauraine Rinne, NP 10/03/2019   This appointment required 34 minutes of patient care (this includes precharting, chart review, review of results, face-to-face care, etc.).

## 2019-10-03 NOTE — Patient Instructions (Addendum)
You were seen today by Lauraine Rinne, NP  for:   1. Severe persistent asthma without complication  Continue Pulmicort nebs twice daily Continue performance nebs twice daily Continue Yupelri nebs daily  Start using Delsym over-the-counter cough syrup to try to help with cough suppression  Can also utilize Mucinex over-the-counter  Samples provided today  Continue to use your flutter valve  Continue Singulair we will send in a refill of this today    2. Allergic rhinitis due to pollen, unspecified seasonality  Continue Singulair  Please make sure that you are taking a daily antihistamine:  >>>choose one of: zyrtec, claritin, allegra, or xyzal  >>>these are over the counter medications  >>>can choose generic option  >>>take daily  >>>this medication helps with allergies, post nasal drip, and cough   Start nasal saline rinses twice daily Use distilled water Shake well Get bottle lukewarm like a baby bottle   Follow Up:    Return in about 3 months (around 01/03/2020), or if symptoms worsen or fail to improve, for Follow up with Dr. Melvyn Novas.   Please do your part to reduce the spread of COVID-19:      Reduce your risk of any infection  and COVID19 by using the similar precautions used for avoiding the common cold or flu:  Marland Kitchen Wash your hands often with soap and warm water for at least 20 seconds.  If soap and water are not readily available, use an alcohol-based hand sanitizer with at least 60% alcohol.  . If coughing or sneezing, cover your mouth and nose by coughing or sneezing into the elbow areas of your shirt or coat, into a tissue or into your sleeve (not your hands). Langley Gauss A MASK when in public  . Avoid shaking hands with others and consider head nods or verbal greetings only. . Avoid touching your eyes, nose, or mouth with unwashed hands.  . Avoid close contact with people who are sick. . Avoid places or events with large numbers of people in one location, like  concerts or sporting events. . If you have some symptoms but not all symptoms, continue to monitor at home and seek medical attention if your symptoms worsen. . If you are having a medical emergency, call 911.   [place] / e-Visit: [email or web address]         [facility]: [facility]: [phone]                   [facility]: [phone]     It is flu season:   >>> Best ways to protect herself from the flu: Receive the yearly flu vaccine, practice good hand hygiene washing with soap and also using hand sanitizer when available, eat a nutritious meals, get adequate rest, hydrate appropriately   Please contact the office if your symptoms worsen or you have concerns that you are not improving.   Thank you for choosing [facility] for your healthcare, and for allowing [place] to partner with you on your healthcare journey. I am thankful to be able to provide care to you today.   Wyn Quaker FNP-C

## 2019-11-12 IMAGING — CR RIGHT TIBIA AND FIBULA - 2 VIEW
4 series · 4 of 4 positions shown · non-contrast
Comparison: None.

CLINICAL DATA: Pain status post fall

EXAM:
RIGHT TIBIA AND FIBULA - 2 VIEW

[x tib-fib ap right (1 of 2)]
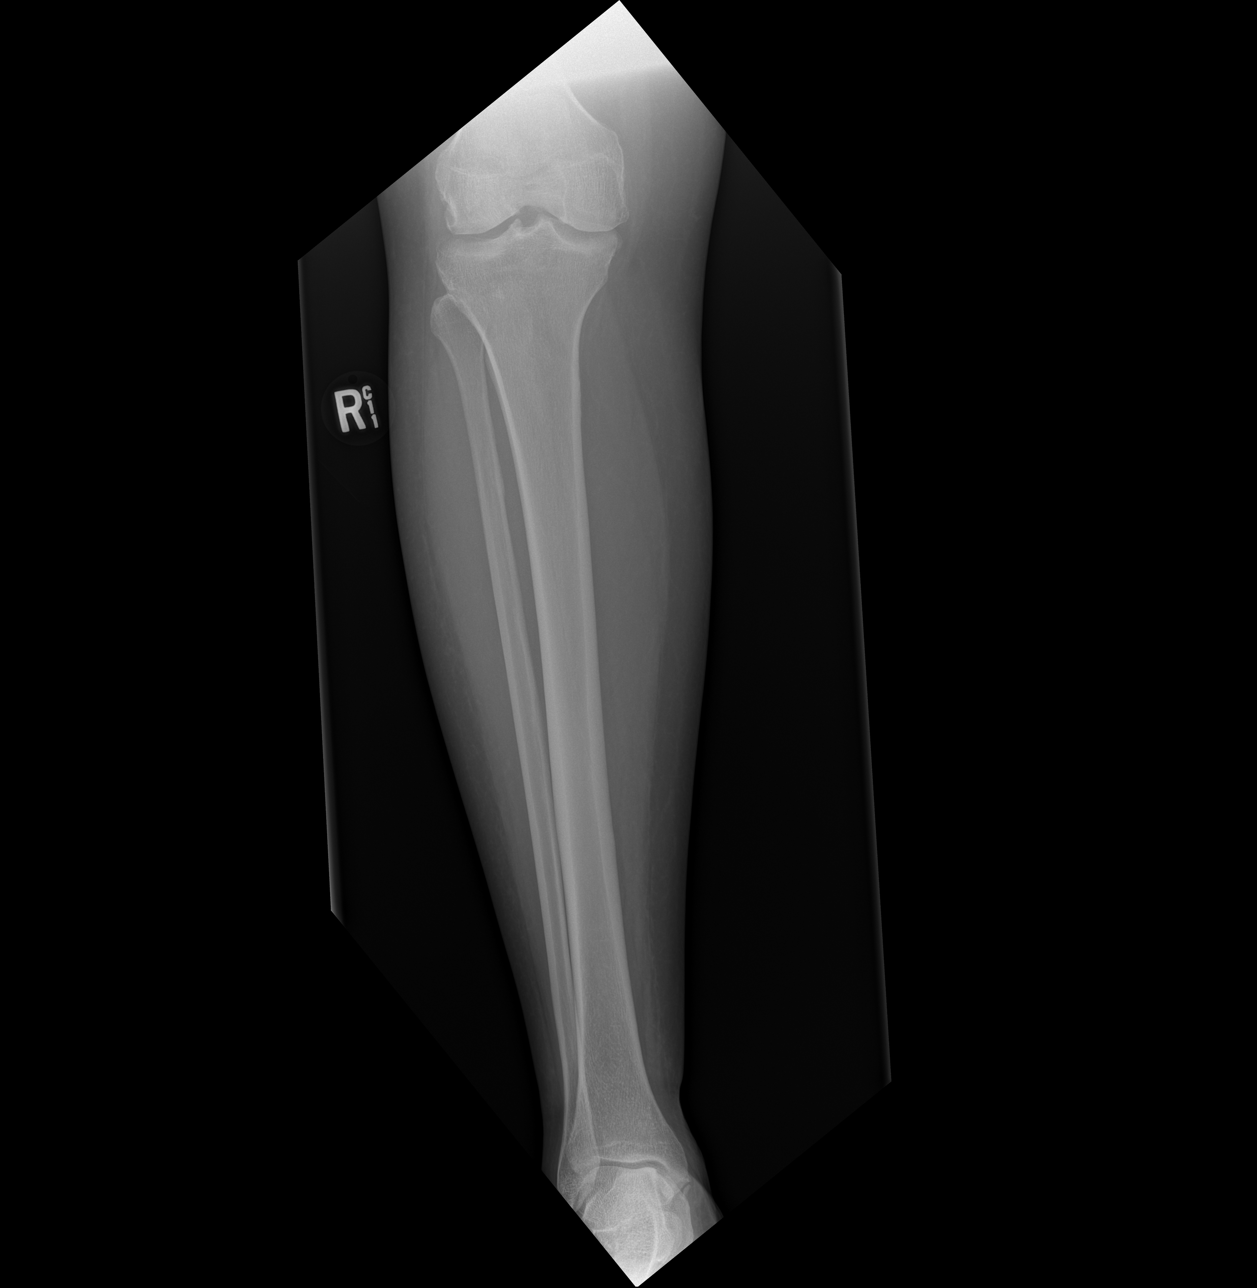

[x tib-fib ap right (2 of 2)]
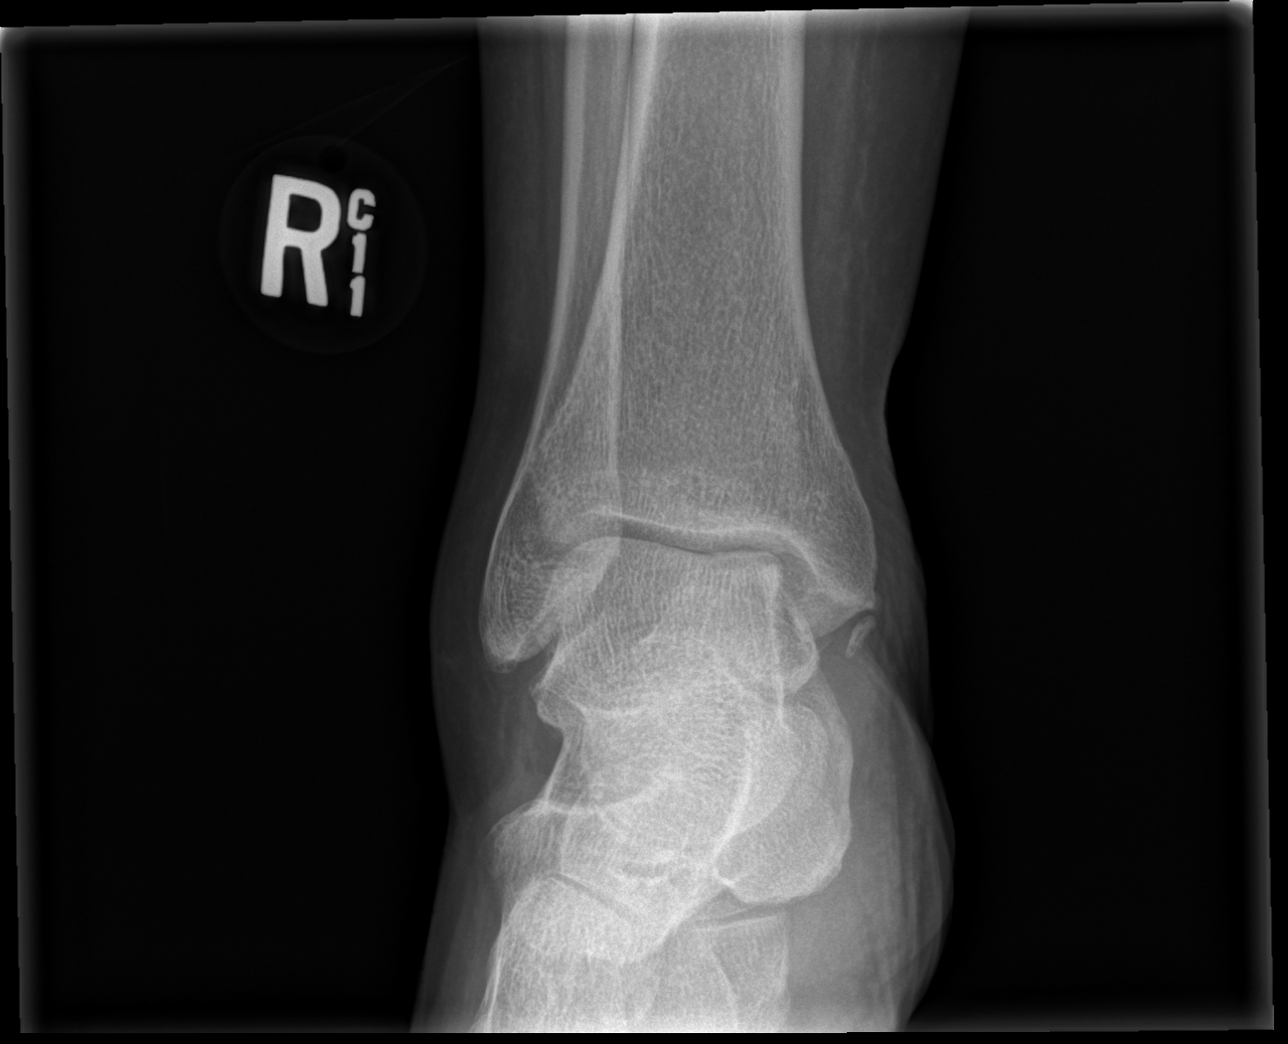

[x tib-fib lat right (1 of 2)]
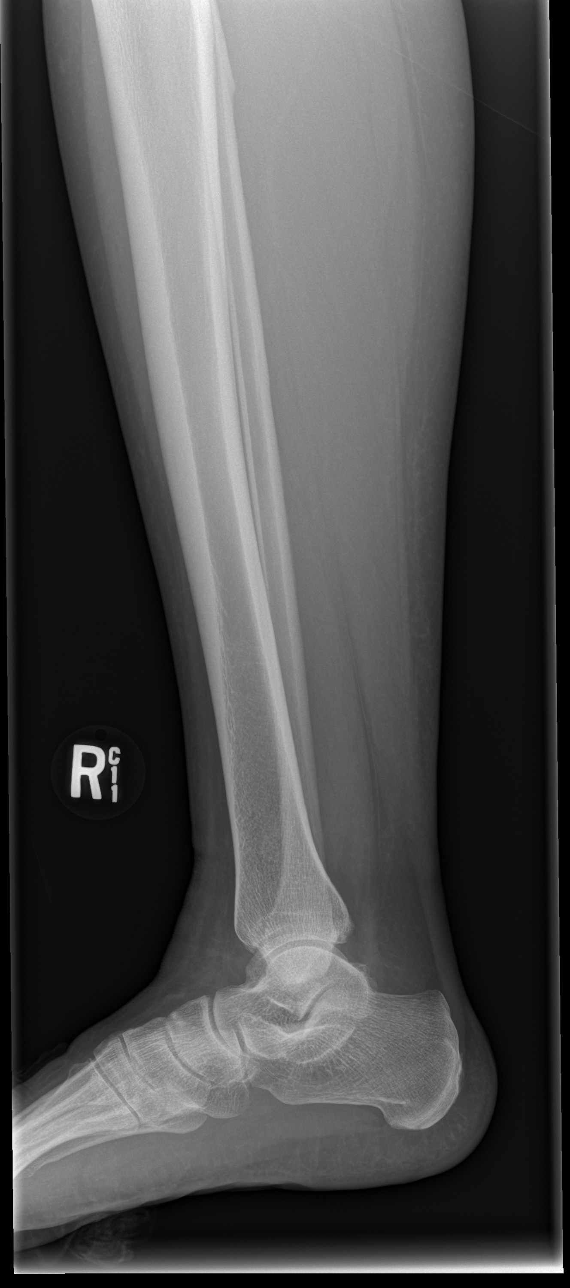

[x tib-fib lat right (2 of 2)]
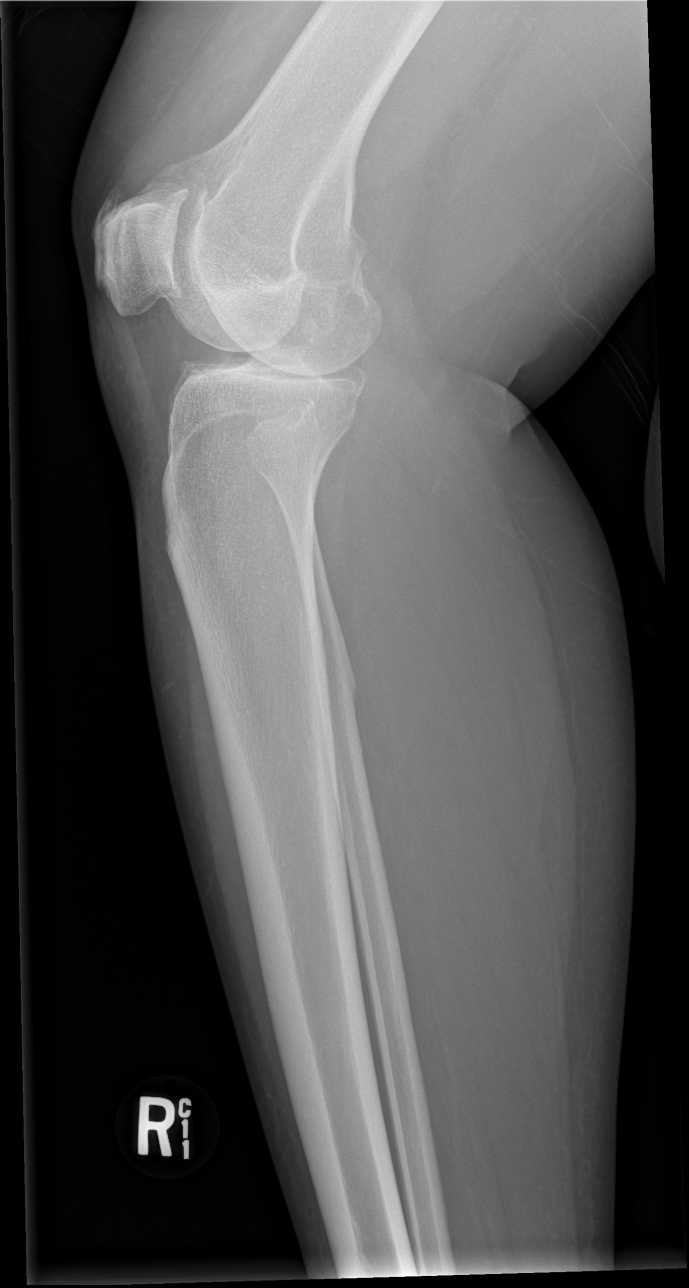

[4 of 4 positions shown; findings below may reference images not displayed]

FINDINGS: There is mild soft tissue swelling about the ankle. There is no
evidence of an acute displaced fracture or dislocation. There are
mild degenerative changes of the knee.
IMPRESSION: Negative.

## 2019-11-12 IMAGING — CR RIGHT RIBS AND CHEST - 3+ VIEW
4 series · 4 of 4 positions shown · non-contrast
Comparison: 03/15/2018.

CLINICAL DATA: Fall 2 days prior.  Right-sided rib pain.

EXAM:
RIGHT RIBS AND CHEST - 3+ VIEW

[w chest pa]
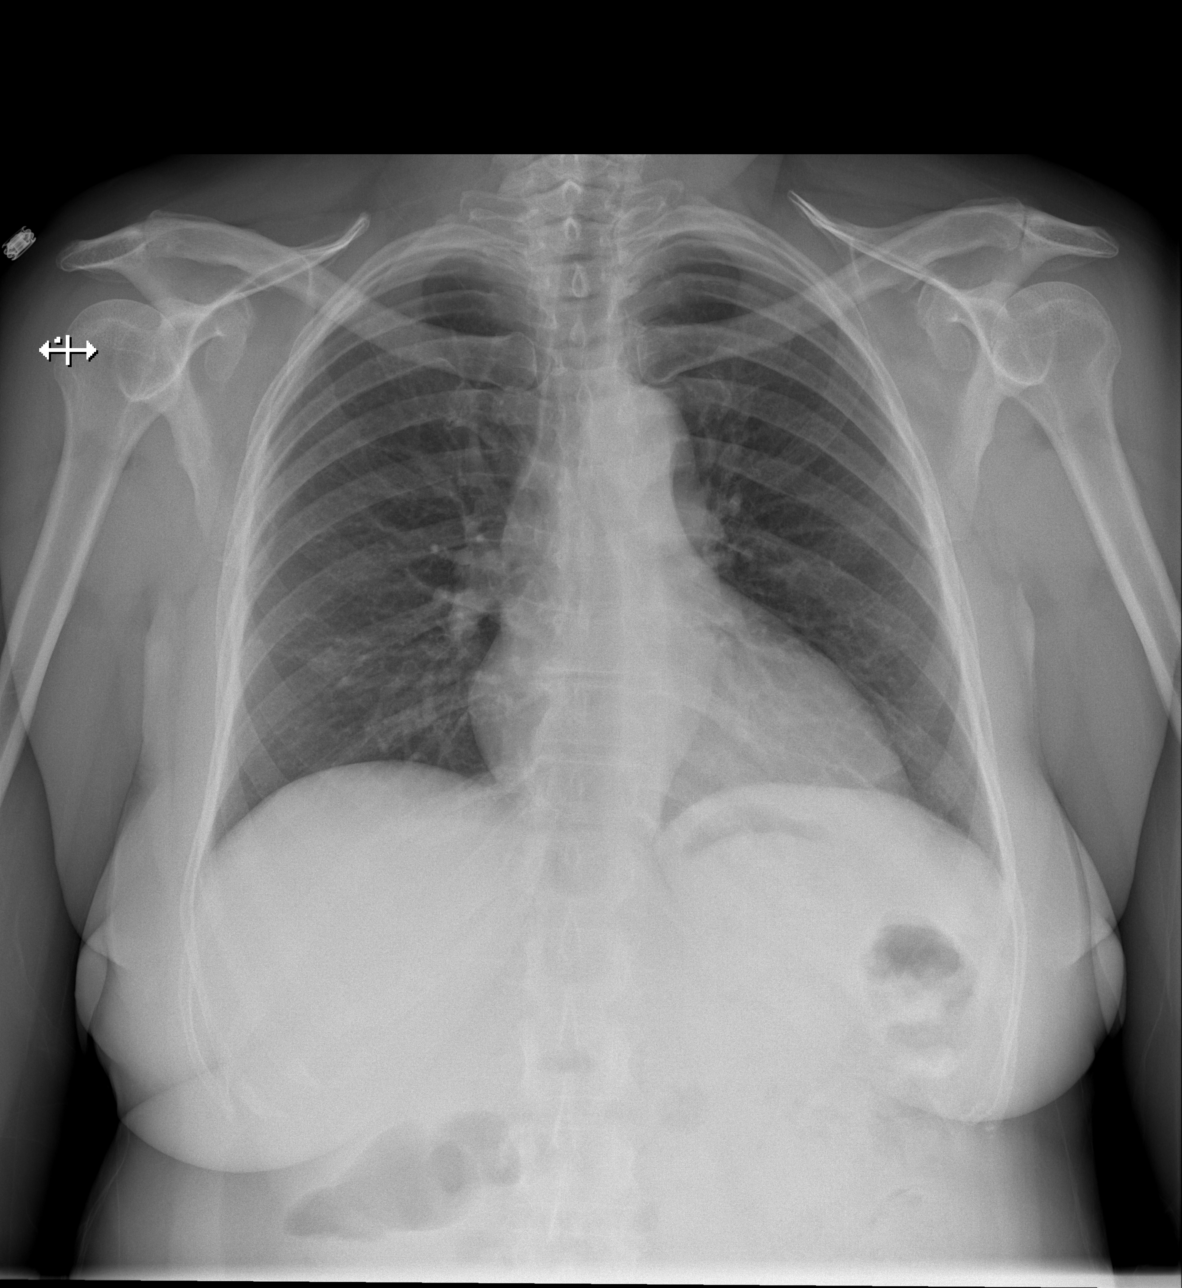

[w ribs ap upper right]
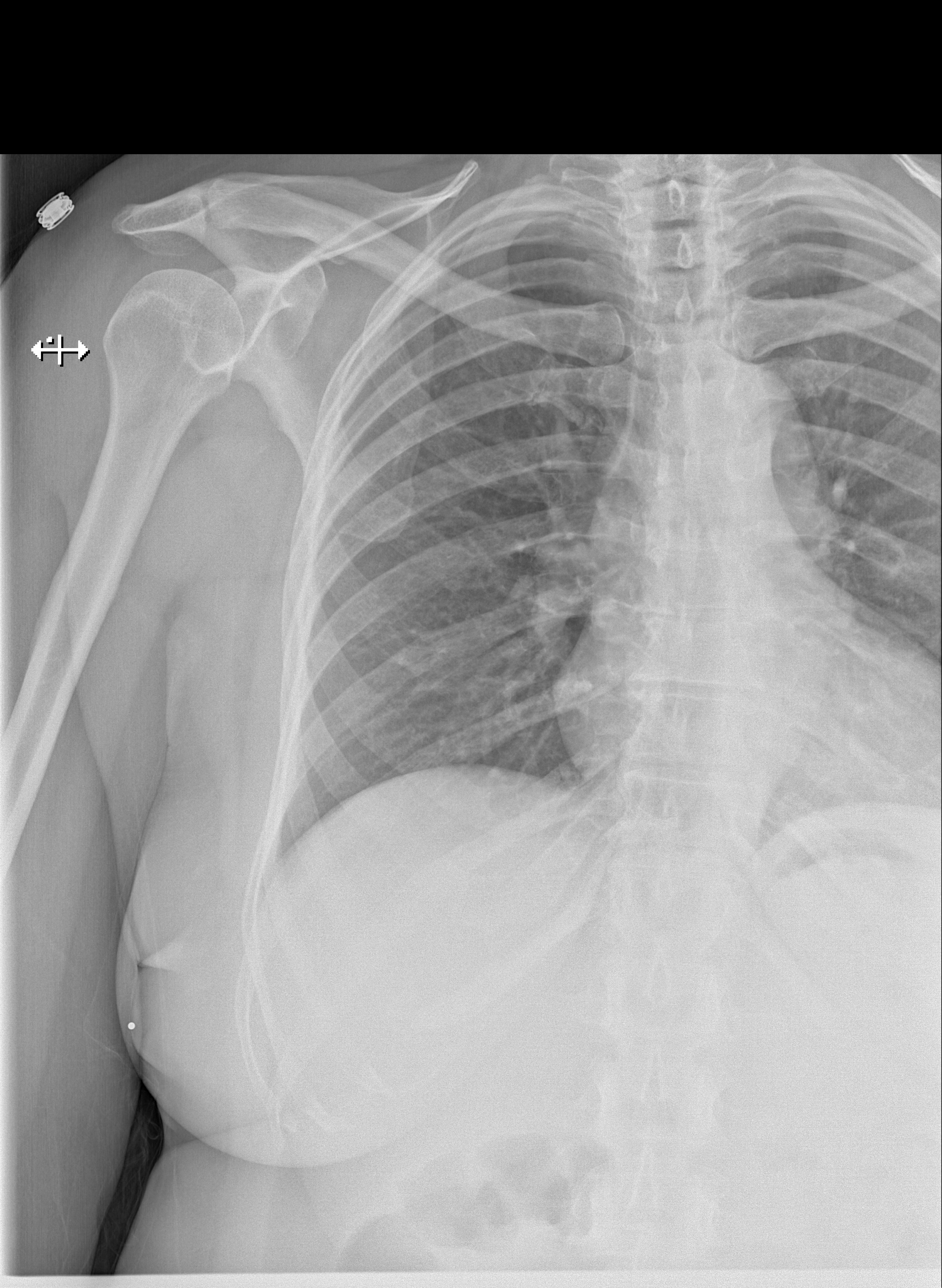

[w ribs ap lower right]
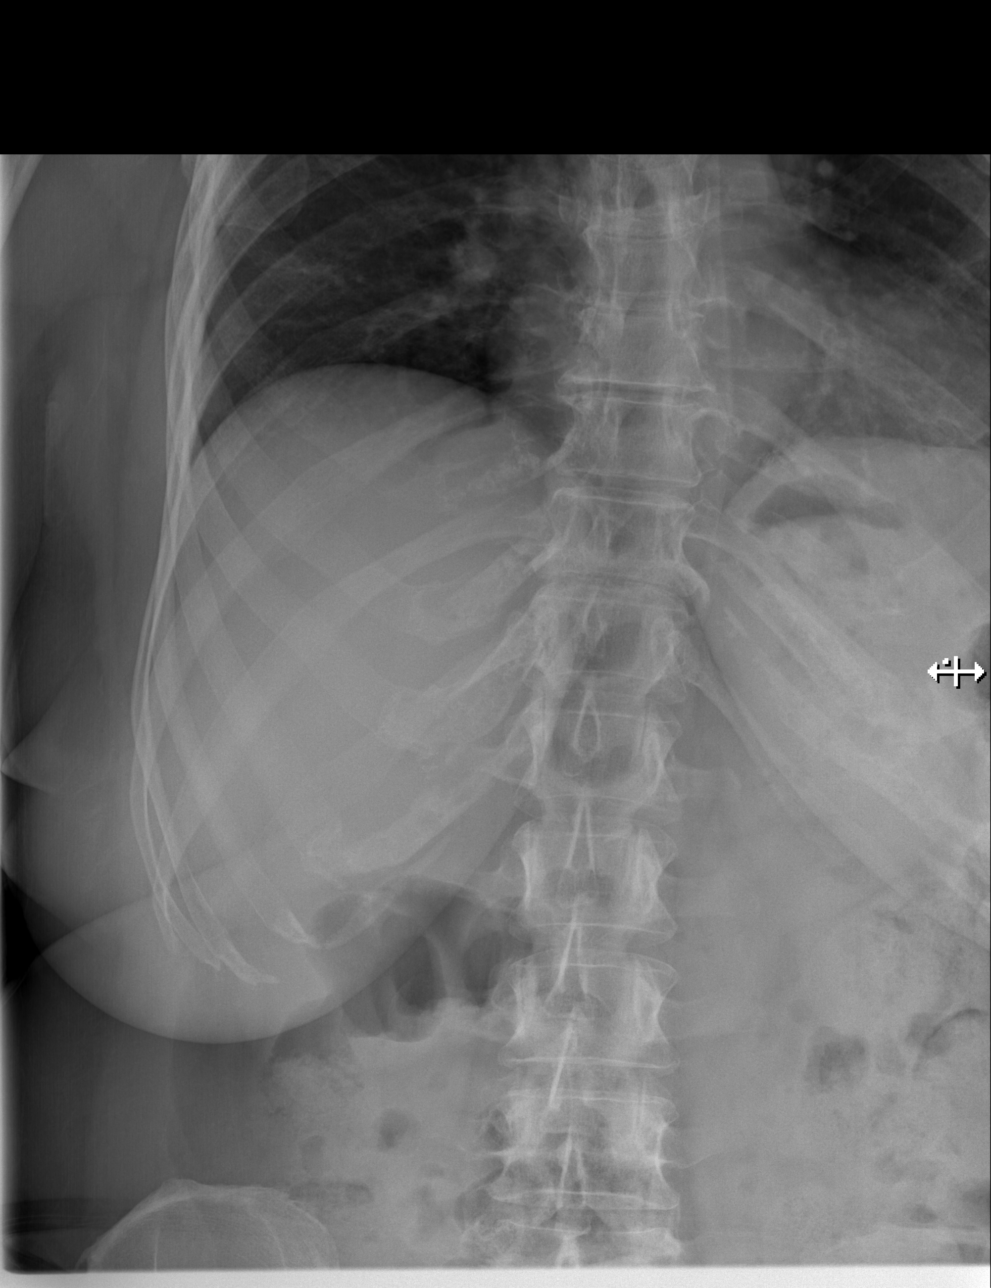

[w ribs obl right]
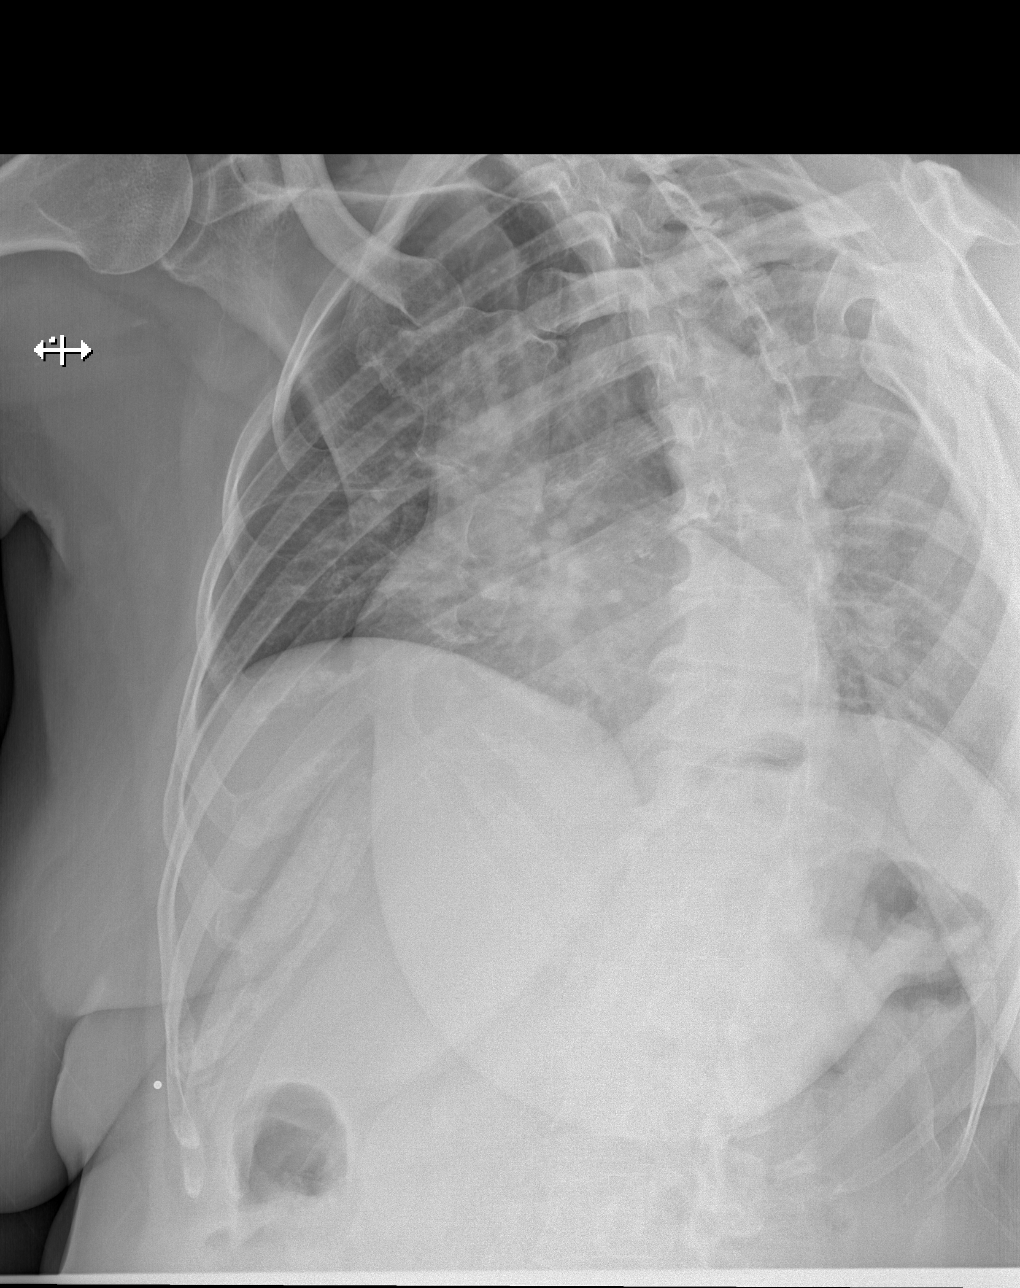

[4 of 4 positions shown; findings below may reference images not displayed]

FINDINGS: No fracture or other bone lesions are seen involving the ribs. There
is no evidence of pneumothorax or pleural effusion. Both lungs are
clear. Heart size and mediastinal contours are within normal limits.
IMPRESSION: Negative.

## 2019-11-18 ENCOUNTER — Other Ambulatory Visit: Payer: Self-pay | Admitting: Internal Medicine

## 2019-11-19 ENCOUNTER — Other Ambulatory Visit: Payer: Self-pay | Admitting: Nurse Practitioner

## 2019-11-20 ENCOUNTER — Telehealth: Payer: Self-pay | Admitting: Internal Medicine

## 2019-11-20 NOTE — Telephone Encounter (Signed)
Spoke with the pt  She states that after she called, she realized that her med does not expire until 2022  She will call for refill if needed

## 2019-11-24 ENCOUNTER — Other Ambulatory Visit: Payer: Self-pay | Admitting: Internal Medicine

## 2019-11-24 DIAGNOSIS — J410 Simple chronic bronchitis: Secondary | ICD-10-CM

## 2019-12-08 ENCOUNTER — Encounter: Payer: Self-pay | Admitting: Neurology

## 2019-12-08 ENCOUNTER — Ambulatory Visit: Payer: Medicare Other | Admitting: Neurology

## 2019-12-08 VITALS — BP 132/80 | HR 66 | Ht 61.0 in | Wt 149.3 lb

## 2019-12-08 DIAGNOSIS — R413 Other amnesia: Secondary | ICD-10-CM

## 2019-12-08 DIAGNOSIS — R569 Unspecified convulsions: Secondary | ICD-10-CM

## 2019-12-08 MED ORDER — DIVALPROEX SODIUM ER 500 MG PO TB24: 1000.0000 mg | ORAL_TABLET | Freq: Every day | ORAL | 4 refills | Status: DC

## 2019-12-08 NOTE — Progress Notes (Signed)
HISTORY OF PRESENT ILLNESS:YY SIDRA OLDFIELD a 69 years old left-handed female, seen in refer by her primary care doctor [name] for evaluation of episodes of confusion on January 27 2016.  I reviewed and summarized the referring note, she had a history of type 2 diabetes, hypertension, hyperlipidemia, pulmonary emboli and, depression, COPD, coronary artery disease, stent placement following a heart attack in January 18 2016, I reviewed echo report on January 18 2016, ejection fraction 65-70%, severe concentric hypertrophy, septal wall thickness was increased, with mild hypertrophy of the posterior wall, abnormal relaxation, increased filling pressure.  Laboratory evaluation BMP showed elevated glucose 270, creatinine 1.06, cholesterol 207, LDL 110, A1c 7.0,  She had her first generalized seizure in 2011, was taken to [facility] evaluated by neurologist there, per patient, there was a suggestion of antiepileptic medications, however it was never followed through.  Over the years, she had no generalized seizure, but every few weeks to every few months, she would have episodes of sudden onset confusion, space out, lasting less than 5 minutes  She went through extreme stress (her grandson died)in 05-26-2015, since then, she began to have increased spells, almost daily basis now, she noted when she was reading, cooking, has transient time elapsed, she came to confused, sometimes preceded by nausea dry mouth sensation. She denies family history of seizure, she works as a Midwife at [employer].  UPDATE May 11 2016:YY She is now taking Depakote ER 500 mg every night, which has helped her tremendously, she no longer has recurrent spells, EEG was normal in October 2017 We have personally reviewed MRI of the brain in November 2017, multiple periventricular and subcortical punctate scattered chronic small vessel disease, increased T2 flare hyperintensity in  the mesial temporal lobe, right hippocampus is slightly smaller than the left,  Update March 20, 2019: She had no recurrent seizure, last seizure was in 2016, tolerating Depakote ER 500 mg 2 tablets at nighttime,  Today her main concern is intermittent memory loss, she retired as a Chief Financial Officer recently, she was noted to have difficulty for the store layout since 2019, she denied a family history of dementia.   UPDATE August 2021: She retired recently, no recurrent seizure, personally reviewed MRI of the brain without contrast, generalized atrophy, supratentorial small vessel disease, no change compared to previous scan in 2017, ORI g or due to elevated intracranial pressures.  It appears stable compared to the previous MRI. Laboratory evaluation May 2021, A1c 6.5, BMP showed elevated creatinine 1.5, glucose 307, potassium 3.3, CBC showed mild decreased hemoglobin of 11.1, November 2020, normal TSH, B12, RPR, depakote level 80s.  While taking Depakote ER 500 mg 2 every night  She has some memory complaints, was put on Namenda 10 mg twice a day tolerating it well REVIEW OF SYSTEMS: Out of a complete 14 system review of symptoms, the patient complains only of the following symptoms, and all other reviewed systems are negative.     ALLERGIES: No Known Allergies  HOME MEDICATIONS: Outpatient Medications Prior to Visit  Medication Sig Dispense Refill  . albuterol (PROAIR HFA) 108 (90 Base) MCG/ACT inhaler Inhale 2 puffs into the lungs every 4 (four) hours as needed for wheezing or shortness of breath. 18 g 5  . albuterol (PROVENTIL) (2.5 MG/3ML) 0.083% nebulizer solution Take 2.5 mg by nebulization every 4 (four) hours as needed for wheezing or shortness of breath.     . ALPRAZolam (XANAX) 0.5 MG tablet Take  1 tablet (0.5 mg total) by mouth 2 (two) times daily as needed for anxiety. 30 tablet 0  . aspirin EC 81 MG tablet Take 81 mg by mouth daily.     Marland Kitchen atorvastatin  (LIPITOR) 40 MG tablet Take 40 mg by mouth at bedtime.    . Biotin 5000 MCG CAPS Take 1 capsule by mouth daily.    . budesonide (PULMICORT) 0.5 MG/2ML nebulizer solution USE 1 VIAL IN NEBULIZER ONCE DAILY 60 mL 3  . chlorpheniramine (CHLOR-TRIMETON) 4 MG tablet Take 4 mg by mouth 2 (two) times daily as needed for allergies or rhinitis.     Marland Kitchen dextromethorphan (DELSYM) 30 MG/5ML liquid Take 60 mg by mouth daily as needed for cough. 2 tsp every 12 hours as needed w/ flutter     . diltiazem (CARTIA XT) 240 MG 24 hr capsule Take 1 capsule (240 mg total) by mouth daily. 90 capsule 3  . divalproex (DEPAKOTE ER) 500 MG 24 hr tablet Take 2 tablets (1,000 mg total) by mouth at bedtime. 180 tablet 3  . esomeprazole (NEXIUM) 20 MG capsule Take 20 mg by mouth every morning.     . hydrALAZINE (APRESOLINE) 25 MG tablet TAKE 1 TABLET BY MOUTH TWICE DAILY (Patient taking differently: Take 25 mg by mouth in the morning and at bedtime. ) 180 tablet 3  . memantine (NAMENDA) 10 MG tablet Take 1 tablet (10 mg total) by mouth 2 (two) times daily. 60 tablet 11  . metFORMIN (GLUCOPHAGE) 500 MG tablet Take 500 mg by mouth 2 (two) times daily with a meal.    . montelukast (SINGULAIR) 10 MG tablet Take 1 tablet (10 mg total) by mouth at bedtime. 30 tablet 3  . Multiple Vitamin (MULTIVITAMIN WITH MINERALS) TABS tablet Take 1 tablet by mouth daily.    . nebivolol (BYSTOLIC) 10 MG tablet Take 1 tablet (10 mg total) by mouth daily. 34 tablet 11  . nitroGLYCERIN (NITROSTAT) 0.4 MG SL tablet DISSOLVE ONE TABLET UNDER THE TONGUE EVERY 5 MINUTES AS NEEDED FOR CHEST PAIN.  DO NOT EXCEED A TOTAL OF 3 DOSES IN 15 MINUTES 25 tablet 0  . PERFOROMIST 20 MCG/2ML nebulizer solution USE 1 VIAL  IN  NEBULIZER TWICE  DAILY - Morning and evening 2 mL 11  . potassium chloride SA (K-DUR,KLOR-CON) 20 MEQ tablet Take 20 mEq by mouth daily.     . predniSONE (DELTASONE) 10 MG tablet Take 4 tabs for 3 days then take 2 tabs for 3 days then take 1 tab for  3 days then stop. 21 tablet 0  . Respiratory Therapy Supplies (FLUTTER) DEVI by Does not apply route.    . revefenacin (YUPELRI) 175 MCG/3ML nebulizer solution Take 3 mLs (175 mcg total) by nebulization daily. 90 mL 0  . torsemide (DEMADEX) 20 MG tablet Take 20 mg by mouth daily as needed (for swelling).     Marland Kitchen acetaminophen-codeine (TYLENOL #3) 300-30 MG tablet Take 1 tablet by mouth every 6 (six) hours as needed for severe pain.  (Patient not taking: Reported on 12/08/2019)     No facility-administered medications prior to visit.    PAST MEDICAL HISTORY: Past Medical History:  Diagnosis Date  . Anxiety   . Asthma   . Bronchitis   . Chronic diastolic CHF (congestive heart failure) ([place])   . Chronic diastolic CHF (congestive heart failure) ([place]) 07/17/2016  . Coronary artery disease    a. NSTEMI 12/2015 - LHC 01/18/16: s/p overlapping DESx2 to RCA, 10% ost-prox  Cx,10% mLAD.  Marland Kitchen Depression   . Diabetes mellitus   . Dyslipidemia   . GERD (gastroesophageal reflux disease)   . Heart attack ([place])   . Hypertension   . Hypertensive heart disease   . NSTEMI (non-ST elevated myocardial infarction) ([place]) 12/2015  . Pneumonia     PAST SURGICAL HISTORY: Past Surgical History:  Procedure Laterality Date  . ABDOMINAL HYSTERECTOMY    . CARDIAC CATHETERIZATION  1994   minimal LAD dz, no other dz, EF normal  . CARDIAC CATHETERIZATION N/A 01/18/2016   Procedure: Left Heart Cath and Coronary Angiography;  Surgeon: Burnell Blanks, MD;  Location: [facility];  Service: Cardiovascular;  Laterality: N/A;  . CARDIAC CATHETERIZATION N/A 01/18/2016   Procedure: Coronary Stent Intervention;  Surgeon: Burnell Blanks, MD;  Location: [facility];  Service: Cardiovascular;  Laterality: N/A;  . COLONOSCOPY WITH PROPOFOL N/A 04/09/2014   Procedure: COLONOSCOPY WITH PROPOFOL;  Surgeon: Cleotis Nipper, MD;  Location: [facility];  Service: Endoscopy;  Laterality: N/A;  . CORONARY STENT  PLACEMENT  01/19/2016   STENT SYNERGY DES 3.24M01 drug eluting stent was successfully placed, and overlaps the 2.5 x 38 mm Synergy stent placed distally.  . ESOPHAGOGASTRODUODENOSCOPY (EGD) WITH PROPOFOL N/A 04/09/2014   Procedure: ESOPHAGOGASTRODUODENOSCOPY (EGD) WITH PROPOFOL;  Surgeon: Cleotis Nipper, MD;  Location: [facility];  Service: Endoscopy;  Laterality: N/A;  . HEMORRHOID SURGERY    . HERNIA REPAIR    . KNEE ARTHROSCOPY      FAMILY HISTORY: Family History  Problem Relation Age of Onset  . Allergies Mother   . Asthma Mother   . CAD Father 52  . Diabetes Father   . Hypertension Father   . Congestive Heart Failure Sister        died in her 24s  . CAD Sister 1    SOCIAL HISTORY: Social History   Socioeconomic History  . Marital status: Married    Spouse name: Not on file  . Number of children: 3  . Years of education: some college  . Highest education level: Not on file  Occupational History  . Occupation: Nutritional therapist  Tobacco Use  . Smoking status: Former Smoker    Packs/day: 1.00    Years: 30.00    Pack years: 30.00    Types: Cigarettes    Quit date: 01/09/2004    Years since quitting: 15.9  . Smokeless tobacco: Never Used  Vaping Use  . Vaping Use: Never used  Substance and Sexual Activity  . Alcohol use: No    Alcohol/week: 0.0 standard drinks  . Drug use: No  . Sexual activity: Not on file  Other Topics Concern  . Not on file  Social History Narrative   Pt lives with husband in [place].   Right-handed.   2 cups caffeine per day.   Social Determinants of Health   Financial Resource Strain:   . Difficulty of Paying Living Expenses:   Food Insecurity:   . Worried About Charity fundraiser in the Last Year:   . Arboriculturist in the Last Year:   Transportation Needs:   . Film/video editor (Medical):   Marland Kitchen Lack of Transportation (Non-Medical):   Physical Activity:   . Days of Exercise per Week:   . Minutes of Exercise per Session:    Stress:   . Feeling of Stress :   Social Connections:   . Frequency of Communication with Friends and Family:   .  Frequency of Social Gatherings with Friends and Family:   . Attends Religious Services:   . Active Member of Clubs or Organizations:   . Attends Archivist Meetings:   Marland Kitchen Marital Status:   Intimate Partner Violence:   . Fear of Current or Ex-Partner:   . Emotionally Abused:   Marland Kitchen Physically Abused:   . Sexually Abused:    PHYSICAL EXAM  Vitals:   12/08/19 1445  BP: 132/80  Pulse: 66  Weight: 149 lb 5 oz (67.7 kg)  Height: 5\' 1"  (1.549 m)   Body mass index is 28.21 kg/m.  Generalized: Well developed, in no acute distress  MMSE - Mini Mental State Exam 06/05/2019 03/20/2019  Not completed: (No Data) -  Orientation to time 5 4  Orientation to Place 4 5  Registration 3 3  Attention/ Calculation 1 1  Recall 1 2  Language- name 2 objects 2 2  Language- repeat 1 1  Language- follow 3 step command 2 3  Language- follow 3 step command-comments she placed the paper in her left hand after she folded it -  Language- read & follow direction 1 1  Write a sentence 1 1  Copy design 1 1  Total score 22 24     NEUROLOGICAL EXAM:  MENTAL STATUS: Speech/Cognition: Awake, alert, normal speech, oriented to history taking and casual conversation.  CRANIAL NERVES: CN II: Visual fields are full to confrontation.  Pupils are round equal and briskly reactive to light. CN III, IV, VI: extraocular movement are normal. No ptosis. CN V: Facial sensation is intact to light touch. CN VII: Face is symmetric with normal eye closure and smile. CN VIII: Hearing is normal to casual conversation CN IX, X: Palate elevates symmetrically. Phonation is normal. CN XI: Head turning and shoulder shrug are intact CN XII: Tongue is midline with normal movements and no atrophy.  MOTOR: Muscle bulk and tone are normal. Muscle strength is normal.  REFLEXES: Reflexes are 2  and  symmetric at the biceps, triceps, knees and ankles. Plantar responses are flexor.  SENSORY: Intact to light touch, pinprick, positional and vibratory sensation at fingers and toes.  COORDINATION: There is no trunk or limb ataxia.    GAIT/STANCE: Posture is normal. Gait is steady with normal steps, base, arm swing and turning.    DIAGNOSTIC DATA (LABS, IMAGING, TESTING) - I reviewed patient records, labs, notes, testing and imaging myself where available.  Lab Results  Component Value Date   WBC 15.2 (H) 09/17/2019   HGB 10.8 (L) 09/17/2019   HCT 32.4 (L) 09/17/2019   MCV 95.6 09/17/2019   PLT 268 09/17/2019      Component Value Date/Time   NA 135 09/17/2019 0227   NA 142 02/22/2018 1103   K 4.3 09/17/2019 0227   CL 100 09/17/2019 0227   CO2 25 09/17/2019 0227   GLUCOSE 205 (H) 09/17/2019 0227   BUN 27 (H) 09/17/2019 0227   BUN 15 02/22/2018 1103   CREATININE 1.03 (H) 09/17/2019 0227   CREATININE 1.06 (H) 01/26/2016 1608   CALCIUM 9.1 09/17/2019 0227   PROT 6.9 12/02/2018 2225   PROT 7.6 05/17/2017 1141   ALBUMIN 3.6 12/02/2018 2225   ALBUMIN 4.3 05/17/2017 1141   AST 15 12/02/2018 2225   ALT 11 12/02/2018 2225   ALKPHOS 53 12/02/2018 2225   BILITOT 0.3 12/02/2018 2225   BILITOT 0.4 05/17/2017 1141   GFRNONAA 55 (L) 09/17/2019 0227   GFRAA >60 09/17/2019 6767  Lab Results  Component Value Date   CHOL 196 07/18/2016   HDL 48 07/18/2016   LDLCALC 107 (H) 07/18/2016   TRIG 203 (H) 07/18/2016   CHOLHDL 4.1 07/18/2016   Lab Results  Component Value Date   HGBA1C 6.5 (H) 09/14/2019   Lab Results  Component Value Date   VITAMINB12 901 03/20/2019   Lab Results  Component Value Date   TSH 0.994 03/20/2019   ASSESSMENT AND PLAN 69 y.o. year old female   Seizure  First generalized seizure was in 2011, multiple recurrent seizure-like spells since January 2017 following both grandson son's sudden death, was under a lot of stress at that time,  Has not had  recurrent seizure since 2017, tolerating Depakote ER, will decrease from 2 tablets to 1 tablet every night    Memory loss  MMSE was 22/30  Laboratory evaluation showed no treatable etiology  MRI of the brain showed moderate supratentorium small vessel disease,  Emphasized importance of moderate exercise,  Recommended 10 mg twice a day  Marcial Pacas, M.D. Ph.D.  [facility] [place], [place] [address] Phone: [phone] Fax:      [phone]

## 2019-12-11 ENCOUNTER — Telehealth: Payer: Self-pay | Admitting: Internal Medicine

## 2019-12-11 NOTE — Telephone Encounter (Signed)
Patient has received both covid vaccines, she is asking if it is safe for her to travel to [place]. She has a wedding scheduled but is concerned about the rise in covid cases. Please advise.

## 2019-12-11 NOTE — Telephone Encounter (Signed)
Called and spoke with pt letting her know the info stated by MW and she verbalized understanding. Nothing further needed. 

## 2019-12-11 NOTE — Telephone Encounter (Signed)
There is a definite risk especially in [place] but the vaccine should prevent serious illness so if goes needs to strickly mask and follow the other cdc guidelines

## 2019-12-30 ENCOUNTER — Telehealth: Payer: Self-pay | Admitting: Cardiology

## 2019-12-30 DIAGNOSIS — I10 Essential (primary) hypertension: Secondary | ICD-10-CM

## 2019-12-30 MED ORDER — HYDRALAZINE HCL 50 MG PO TABS: 50.0000 mg | ORAL_TABLET | Freq: Two times a day (BID) | ORAL | 3 refills | Status: DC

## 2019-12-30 NOTE — Telephone Encounter (Signed)
.  Pt c/o BP issue: STAT if pt c/o blurred vision, one-sided weakness or slurred speech  1. What are your last 5 BP readings? Last night 197/110 this morning 179/98   2. Are you having any other symptoms (ex. Dizziness, headache, blurred vision, passed out)a little pressure in chest  3. What is your BP issue? Blood pressure high

## 2019-12-30 NOTE — Telephone Encounter (Signed)
Called patient back about her message. Patient complaining of elevated BP, today 189/98 HR 89. Patient stated she has had an elevated BP for two days. Asked patient about chest pressure, patient stated it's not really chest pain, patient denies any SOB. Patient stated she also has swelling in BLE. Consulted Dr. Marlou Porch, he suggested patient increase her hydralazine to 50 mg BID and follow up with HTN clinic. Made patient an appointment with Pharm D tomorrow and sent in new prescription. Patient will call our office if her symptoms do not improve.

## 2019-12-31 ENCOUNTER — Ambulatory Visit (INDEPENDENT_AMBULATORY_CARE_PROVIDER_SITE_OTHER): Payer: Medicare Other | Admitting: Pharmacist

## 2019-12-31 ENCOUNTER — Other Ambulatory Visit: Payer: Self-pay

## 2019-12-31 VITALS — BP 160/82

## 2019-12-31 DIAGNOSIS — I10 Essential (primary) hypertension: Secondary | ICD-10-CM

## 2019-12-31 MED ORDER — HYDRALAZINE HCL 25 MG PO TABS
25.0000 mg | ORAL_TABLET | Freq: Three times a day (TID) | ORAL | 3 refills | Status: DC
Start: 2019-12-31 — End: 2020-02-26

## 2019-12-31 NOTE — Progress Notes (Signed)
Patient ID: [name]                 DOB: [birth year]                      MRN: [id number]     HPI: [name] is a 69 y.o. female referred by Dr. Marlou Porch to HTN clinic. PMH is significant for HTN, HLD, CHF, CAD, NSTEMI, acute PE, stage III CKD.  Patient called the office on 8/31 with complaints of abnormally elevated home blood pressure readings over past 2-3 days, but without concerning symptoms (no chest pain or SOB). Dr. Marlou Porch increased hydralazine dose to 50 mg BID. Patient presents today for follow up.   Medication change was just made yesterday. States her BP this AM was 149/79. She denies dizziness, lightheadedness, headache, blurred vision, SOB or swelling. Swelling has been well controlled with toresmide and elevation of feet. Cannot wear compression stockings bc they are so tight they hurt her legs. She lives with her husband, granddaughter and great grandchildren. She states she has dementia but new medication she is on is really helping.   She uses a medbox and checks her blood pressure with machine from [facility]. Results go to them and they call her when they are abnormal. She is not resting for 5 min prior to checking but all other technique appears correct. She was walking 5 days a week, but her husband told her she was going to hurt her knees again walking on asphalt so she decrease to 3 times a week. Does not want to go to gym around other people. She is vaccinated. There is some confusion over patient's allergies as previously listed to be allergic to amlodipine and lisinopril but patient denies this. She is unsure why she was taken off of ARBS but does not recall and adverse reactions.  Current HTN meds: Hydralazine 50 mg PO twice a day (increased from 25 mg BID). Nebivolol 10 mg PO daily. Diltiazem 240 mg PO daily, toresmide 20 daily Previously tried: Irbesartan, valsartan, valsartan/HCTZ, metoprolol succinate BP goal: <130/80  Family History: HTN (father), CAD (father,  sister), CHF (sister)  Social History: Married with 3 children. Former smoker (quit 2005).  Diet:   Exercise: walks 20 min 3 days a week  Home BP readings: 149/79, 197/110, 179/98, 189/98 (reported yesterday 12/30/19)  Wt Readings from Last 3 Encounters:  12/08/19 149 lb 5 oz (67.7 kg)  10/03/19 152 lb 6.4 oz (69.1 kg)  09/17/19 144 lb 2.9 oz (65.4 kg)   BP Readings from Last 3 Encounters:  12/08/19 132/80  10/03/19 134/70  09/17/19 (!) 150/68   Pulse Readings from Last 3 Encounters:  12/08/19 66  10/03/19 69  09/17/19 82    Renal function: Scr was 1.03 and eGFR was >60 on 09/17/2019  CrCl cannot be calculated (Patient's most recent lab result is older than the maximum 21 days allowed.).  Past Medical History:  Diagnosis Date  . Anxiety   . Asthma   . Bronchitis   . Chronic diastolic CHF (congestive heart failure) ([place])   . Chronic diastolic CHF (congestive heart failure) ([place]) 07/17/2016  . Coronary artery disease    a. NSTEMI 12/2015 - LHC 01/18/16: s/p overlapping DESx2 to RCA, 10% ost-prox Cx,10% mLAD.  Marland Kitchen Depression   . Diabetes mellitus   . Dyslipidemia   . GERD (gastroesophageal reflux disease)   . Heart attack ([place])   . Hypertension   . Hypertensive heart disease   .  NSTEMI (non-ST elevated myocardial infarction) ([place]) 12/2015  . Pneumonia     Current Outpatient Medications on File Prior to Visit  Medication Sig Dispense Refill  . albuterol (PROAIR HFA) 108 (90 Base) MCG/ACT inhaler Inhale 2 puffs into the lungs every 4 (four) hours as needed for wheezing or shortness of breath. 18 g 5  . albuterol (PROVENTIL) (2.5 MG/3ML) 0.083% nebulizer solution Take 2.5 mg by nebulization every 4 (four) hours as needed for wheezing or shortness of breath.     . ALPRAZolam (XANAX) 0.5 MG tablet Take 1 tablet (0.5 mg total) by mouth 2 (two) times daily as needed for anxiety. 30 tablet 0  . aspirin EC 81 MG tablet Take 81 mg by mouth daily.     Marland Kitchen atorvastatin (LIPITOR) 40 MG  tablet Take 40 mg by mouth at bedtime.    . Biotin 5000 MCG CAPS Take 1 capsule by mouth daily.    . budesonide (PULMICORT) 0.5 MG/2ML nebulizer solution USE 1 VIAL IN NEBULIZER ONCE DAILY 60 mL 3  . chlorpheniramine (CHLOR-TRIMETON) 4 MG tablet Take 4 mg by mouth 2 (two) times daily as needed for allergies or rhinitis.     Marland Kitchen dextromethorphan (DELSYM) 30 MG/5ML liquid Take 60 mg by mouth daily as needed for cough. 2 tsp every 12 hours as needed w/ flutter     . diltiazem (CARTIA XT) 240 MG 24 hr capsule Take 1 capsule (240 mg total) by mouth daily. 90 capsule 3  . divalproex (DEPAKOTE ER) 500 MG 24 hr tablet Take 2 tablets (1,000 mg total) by mouth at bedtime. 90 tablet 4  . esomeprazole (NEXIUM) 20 MG capsule Take 20 mg by mouth every morning.     . hydrALAZINE (APRESOLINE) 50 MG tablet Take 1 tablet (50 mg total) by mouth in the morning and at bedtime. 180 tablet 3  . memantine (NAMENDA) 10 MG tablet Take 1 tablet (10 mg total) by mouth 2 (two) times daily. 60 tablet 11  . metFORMIN (GLUCOPHAGE) 500 MG tablet Take 500 mg by mouth 2 (two) times daily with a meal.    . montelukast (SINGULAIR) 10 MG tablet Take 1 tablet (10 mg total) by mouth at bedtime. 30 tablet 3  . Multiple Vitamin (MULTIVITAMIN WITH MINERALS) TABS tablet Take 1 tablet by mouth daily.    . nebivolol (BYSTOLIC) 10 MG tablet Take 1 tablet (10 mg total) by mouth daily. 34 tablet 11  . nitroGLYCERIN (NITROSTAT) 0.4 MG SL tablet DISSOLVE ONE TABLET UNDER THE TONGUE EVERY 5 MINUTES AS NEEDED FOR CHEST PAIN.  DO NOT EXCEED A TOTAL OF 3 DOSES IN 15 MINUTES 25 tablet 0  . PERFOROMIST 20 MCG/2ML nebulizer solution USE 1 VIAL  IN  NEBULIZER TWICE  DAILY - Morning and evening 2 mL 11  . potassium chloride SA (K-DUR,KLOR-CON) 20 MEQ tablet Take 20 mEq by mouth daily.     . predniSONE (DELTASONE) 10 MG tablet Take 4 tabs for 3 days then take 2 tabs for 3 days then take 1 tab for 3 days then stop. 21 tablet 0  . Respiratory Therapy Supplies  (FLUTTER) DEVI by Does not apply route.    . revefenacin (YUPELRI) 175 MCG/3ML nebulizer solution Take 3 mLs (175 mcg total) by nebulization daily. 90 mL 0  . torsemide (DEMADEX) 20 MG tablet Take 20 mg by mouth daily as needed (for swelling).      No current facility-administered medications on file prior to visit.    No Known Allergies  There were no vitals taken for this visit.   Assessment/Plan:  1. Hypertension - Blood pressure is above goal of <130/80. Dose change of hydralazine just made yesterday. Discussed with patient that this medication is best taken three times a day instead of 2. Patient states she wont have any problem taking three times a day. Will change hydralazine to 53m three times a day instead of 58mBID. I advised patient that as long as she is wearing a good pair of sneakers she will be fine walking on the street outside. Encouraged her to increase to 5 days a week of walking. Will follow up in clinic in 2 weeks. I do not see any contraindication to ARB or thiazide diuretics. Since pt is an AfSenegalshe would benefit more from a thiazide. This would be an option in the future if much more blood pressure lowering is needed. Patient does not really want more medications however.   Thank you  MeRamond DialPharm.D, BCPS, CPP [id number]. [address]GrWilbur[place] Two[place] [id number]: [phone]: [phone]

## 2019-12-31 NOTE — Patient Instructions (Addendum)
It was a pleasure to meet you!  Please starting taking hydralazine 25 mg three times a day 8:00, 2PM and 9:30 PM Continue taking nebivolol 10 mg daily, Diltiazem 240 mg daily and toresmide 20 daily  Increase your walking up to 5 days a week  Before checking your blood pressure make sure: You are seated and quite for 5 min before checking Feet are flat on the floor Siting in chair with your back supported straight up and down Arm resting on table or arm of chair at heart level Bladder is empty You have NOT had caffeine or tobacco within the last 30 min  Lifestyle changes can make a world of difference and are even more important than medications: Try to keep your sodium intake to 2300 mg of sodium per day Get 6-8 uninterrupted hours of sleep per night Aim for a goal of 150 min of moderate aerobic exercise (ie brisk walking, bike riding) per week  Call us at [phone] with any questions

## 2020-01-06 ENCOUNTER — Ambulatory Visit: Payer: Medicare Other | Admitting: Internal Medicine

## 2020-01-08 NOTE — Addendum Note (Signed)
Addended by: Noberto Retort C on: 01/08/2020 07:50 AM   Modules accepted: Orders

## 2020-01-12 ENCOUNTER — Ambulatory Visit: Payer: Medicare Other | Admitting: Internal Medicine

## 2020-01-12 ENCOUNTER — Other Ambulatory Visit: Payer: Self-pay

## 2020-01-12 ENCOUNTER — Encounter: Payer: Self-pay | Admitting: Internal Medicine

## 2020-01-12 DIAGNOSIS — R05 Cough: Secondary | ICD-10-CM | POA: Diagnosis not present

## 2020-01-12 DIAGNOSIS — R058 Other specified cough: Secondary | ICD-10-CM

## 2020-01-12 DIAGNOSIS — J4530 Mild persistent asthma, uncomplicated: Secondary | ICD-10-CM

## 2020-01-12 NOTE — Patient Instructions (Addendum)
Plan A = Automatic = Always=    Performist and the budesonide twice daily  And continue esmeprazole 40 mg Take 30-60 min before first meal of the day and 20 mg Take 30-60 min before last meal of the day   Plan B = Backup (to supplement plan A, not to replace it) Only use your albuterol inhaler as a rescue medication to be used if you can't catch your breath by resting or doing a relaxed purse lip breathing pattern.  - The less you use it, the better it will work when you need it. - Ok to use the inhaler up to 2 puffs  every 4 hours if you must but call for appointment if use goes up over your usual need - Don't leave home without it !!  (think of it like the spare tire for your car)   Plan C = Crisis (instead of Plan B but only if Plan B stops working) - only use your albuterol nebulizer if you first try Plan B and it fails to help > ok to use the nebulizer up to every 4 hours but if start needing it regularly call for immediate appointment   Plan D = Deltasone if ABC not working  - Prednisone 10 mg take  4 each am x 2 days,   2 each am x 2 days,  1 each am x 2 days and stop   Plan E = ER - go to ER or call 911 if all else fails     See [name] NP w/in 6 weeks with all your medications, even over the counter meds, separated in two separate bags, the ones you take no matter what vs the ones you stop once you feel better and take only as needed when you feel you need them.   [name]  will generate for you a new user friendly medication calendar that will put [place] all on the same page re: your medication use.     Without this process, it simply isn't possible to assure that we are providing  your outpatient care  with  the attention to detail we feel you deserve.   If we cannot assure that you're getting that kind of care,  then we cannot manage your problem effectively from this clinic.  Once you have seen [name] and we are sure that we're all on the same page with your medication use she will arrange  follow up with me.

## 2020-01-12 NOTE — Progress Notes (Signed)
Subjective:   Patient ID: [name], female    DOB: 29-Jul-1950    MRN: [id number]    Brief patient profile:  69   yobf quit smoking 2005 with nl pfts 2012  with recurrent non-specific resp flares with nl pfts during flares c/w pseudoasthma.     History of Present Illness  01/24/2016  Extended post hosp f/u ov/ transition of care/ /[name] re: dtca asthma/ vcd symb 160 and pred 5 x 10 mg daily  Chief Complaint  Patient presents with  . Follow-up    Pt. recently got out the [facility] for an asthma attack, Pt. states her breathing remains the same, coughing with some yellow to white mucus,Has been needing to use Ventolin more often  last doing well 2 months prior to OV   While on Symb 160/ gerd rx but not dosing ac as rec  Waking up prematurely x 2 months with cough/wheeze/ sob but never contacted me as per the instructions rec Plan A = Automatic = dulera 100 Take 2 puffs first thing in am and then another 2 puffs about 12 hours later.                                      Nexum 40 mg Take 30-60 min before first meal of the day and Pepcid AC 20 mg  at bedtime Work on inhaler technique  Plan B = Backup Only use your albuterol as a rescue medication Plan C = Crisis - only use your albuterol nebulizer if you first try Plan B and it fails to help > ok to use the nebulizer up to every 4 hours but if start needing it regularly call for immediate appointment GERD diet  Take delsym two tsp every 12 hours and use the flutter valve as much as possible and supplement if needed with  tramadol 50 mg up to 2 every 4 hours to suppress the urge to cough  Once you have eliminated the cough for 3 straight days try reducing the tramadol first,  then the delsym as tolerated.   Prednisone 5 mg x 8, then 6, then 4 then 2, then 1 daily x 2 days and off  please schedule a follow up office visit in 2 weeks, sooner if needed with all meds/ inhalers/ neb solutions in hand     09/18/2016  f/u ov/[name] re:  UACS vs  Asthma brought med calendar but not updated / on symb 160 / max gerd rx  Chief Complaint  Patient presents with  . Follow-up    Pt c/o occ wheezing. She has not used rescue inhaler or neb recently.   still feels wheezing  And doe = MMRC1 = can walk nl pace, flat grade, can't hurry or go uphills or steps s sob rec Change the symbicort to 80 Take 2 puffs first thing in am and then another 2 puffs about 12 hours later.  Work on inhaler technique:  Use the med calendar daily to organize your medications as a "checklist" See [name] NP in 4  weeks with all your medications> never happened    Admit date: 12/19/2017 Discharge date: 12/24/2017  D/c home with steroids taper, monitor blood glucose while on steroids She reports run out of pulmicort nebs, new prescription provided, she is to follow up with Dr Melvyn Novas to further discuss asthma/copd meds, she is concerned about cost of these meds  Discharge Diagnoses:      Active Hospital Problems   Diagnosis Date Noted  . Acute bronchitis with asthma with acute exacerbation 01/16/2016  . Acute renal failure superimposed on stage 3 chronic kidney disease ([place]) 07/17/2016  . Chronic diastolic CHF (congestive heart failure) ([place]) 07/17/2016  . Acute respiratory failure with hypoxia ([place])   . Upper airway cough syndrome vs Asthma  08/28/2014  . Diabetes mellitus type 2, controlled ([place]) 08/06/2014  . Essential hypertension 07/21/2008           12/28/2017 ext post hosp f/u ov/[name] re: confused with meds again / only slightly better breathing  Chief Complaint  Patient presents with  . Follow-up    Breathing has improved slightly. She has been out of brovana due to high cost x 3 days. She has had to use her albuterol inhaler and neb daily since then.   Dyspnea:  71 ft = About the same as usual Cough: after supper is the worse, but not much mucus Sleeping: wakes up coughing 71 degrees due to sensation of reflux    SABA use: last 5 h prior to OV    02: none  Sleeping at 30 degrees    rec Plan A = Automatic = Performist 20 mcg one vial  with pulmocort one half vial twice daily  Gabapentin 100 mg three times daily Nexium Take 30- 60 min before your first and last meals of the day and zantac (ranitidine) at bedtime  Along with chlorpheniramine 4 mg 1 or 2 also at bedtime (over the counter)  Continue prednisone 10 mg every day  Plan B = Backup for breathing Only use your albuterol as a rescue medication  Plan B = Backup for coughing  Add tramadol 50 mg 1-2 every 4 hours if needed  Plan C = Crisis - only use your albuterol nebulizer if you first try Plan B and it fails to help > ok to use the nebulizer up to every 4 hours but if start needing it regularly call for immediate appointment     03/11/2018  f/u ov/[name] re: 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 Follow-up    Cough had improved and then started back 1 day ago.  Cough is now non prod.  She is using her albuterol inhaler 2 x daily on average. She has not needed albuterol neb.    Dyspnea:  MMRC71 = can't walk a nl pace on a flat grade s sob but does fine slow and flat slowed by knee  Cough: noct > day  Sleeping: 71 degrees due to subjective reflux despite max acid suppresion  SABA use: as above - never noct 02: none   rec Gabapentin 100 mg four  times daily  Take chortab 4 mg x 2 at bedtime and up to every 4 hours during the day as needed for drainage  Take delsym two tsp every 12 hours and supplement if needed with  tramadol 50 mg up to 2 every 4 hours to suppress the urge to cough. Swallowing water and/or using ice chips/non mint and menthol containing candies (such as lifesavers or sugarless jolly ranchers) are also effective.  You should rest your voice and avoid activities that you know make you cough. Once you have eliminated the cough for 3 straight days try reducing the tramadol first,  then the delsym as tolerated.  Plan D = Prednisone x 6  days if not cough/ sob not better See calendar for specific  medication instructions and bring it back for each and every office visit for every healthcare provider you see.  Without it,  you may not receive the best quality medical care that we feel you deserve. Please schedule a follow up office visit in 4 weeks, call sooner if needed with all medications /inhalers/ solutions in hand so we can verify exactly what you are taking. This includes all medications from all doctors and over the [place] separate them into two bags:  the ones you take automatically, no matter what, vs the ones you take just when you feel you need them "BAG #2 is UP TO YOU"  - this will really help [place] help you take your medications more effectively.    03/15/18 back to ER - did not activate action plan prior (never took more than 1 tramadol every 12 hours to control cough.   03/20/2018  Extended post hops  f/u ov/[name] re: transition of care re   vcd vs asthma  Chief Complaint  Patient presents with  . Follow-up    recent admission 03/15/18- pt reports of wheezing, sob with exertion, no prod cough & chest tightness.   Dyspnea:  MMRC4  = sob if tries to leave home or while getting dressed   - no better now vs during hosp stay. Seen by Dr [place] Bells who strongly supported vcd dx during admit Cough: dry cough settles down overnight and with plb as does the "wheeze"  Sleeping: 45 degrees SABA use: last neb 8 h prior to OV  "did not help" 02: no  rec Increase gabapentin to 100 mg take 2 (=200)  four times daily as per med calendar For cough > delsym 2 tsp every 12 hours with use of flutter valve as much as possible  and supplement with tramadol 50 mg up to 1-2 every 4 hours as needed  For breathing problems not improving with use of nebulizer > Prednisone 10 mg take  4 each am x 2 days,   2 each am x 2 days,  1 each am x 2 days and stop  GERD diet / lifestyle recs Keep previous appt with your medications in 2 bags     04/05/2018  f/u ov/[name] re: uacs/ VCD - brought all meds but confused with gen vs trade names, could not tell me how she is supposed to take esmeprazole when I handed it to her.  Chief Complaint  Patient presents with  . Follow-up  Dyspnea:  50 ft  Cough: better  Sleeping: 45 degrees SABA use: twice a day hfa , rare neb 02: none   rec Gabapentin 300 mg four times daily  Budesonide 0.25 mcg twice daily with performist If breathing worse > Prednisone 10 mg take  4 each am x 2 days,   2 each am x 2 days,  1 each am x 2 days and stop  For cough > Take delsym two tsp every 12 hours and flutter valve as much as possible supplement if needed with  tramadol 50 mg (or tylenol #3) up to 2 every 4 hours to suppress the urge to cough. Swallowing water and/or using ice chips/non mint and menthol containing candies (such as lifesavers or sugarless jolly ranchers) are also effective.  You should rest your voice and avoid activities that you know make you cough. Once you have eliminated the cough for 3 straight days try reducing the tramadol first,  then the delsym as tolerated. Nexium (esmaprazole)  Take 30- 60  min before your first and last meals of the day  Please schedule a follow up office visit in 2  weeks, sooner if needed - needs full pfts on return      04/18/2018  f/u ov/[name] re: asthma vs vcd  - brought most but not all her meds - missing nexium, has budesonide in prn bag/ pill organizer has 2 slots per day and hard to read to top print on each slot Chief Complaint  Patient presents with  . Follow-up    PFT's done today. She c/o increased SOB for the past 3 days. She is using her albuterol inhaler and neb with albuterol both about 2 x per day.   Dyspnea:  Improved, able to do [employer] shopping now  Cough: varies s pattern, min mucoid  Sleeping: 45 degrees all pillows  SABA use: twice daily due to overdoing it at work rec Plan A = Automatic = Budesonide/perforomist twice daily as per med  calendar Plan B = Backup Only use your albuterol inhaler (prefer Proair- RED  but same as Ventolin-blue) as a rescue medication to be used if you can't catch your breath by resting or doing a relaxed purse lip breathing pattern.  - The less you use it, the better it will work when you need it. - Ok to use the inhaler up to 2 puffs  every 4 hours if you must but call for appointment if use goes up over your usual need - Don't leave home without it !!  (think of it like the spare tire for your car)  Plan C = Crisis - only use your albuterol nebulizer if you first try Plan B and it fails to help > ok to use the nebulizer up to every 4 hours but if start needing it regularly call for immediate appointment    02/04/19 televisit Stop gabapentin completely since you are only taking one at bedtime but add it back if start coughing again for any reason and ok to build up to 4 x daily  If your breathing gets worse, increase the Performist to 20 mcg (one vial) every 12 hours instead of just once a day.   03/12/2019  f/u ov/[name] re: asthma with component of vcd / brought med calendar but not keeping it updated  Chief Complaint  Patient presents with  . Follow-up    Breathing is doing well today.  She rarely uses her albuterol inhaler or albuterol neb.    Dyspnea:  Not limited by breathing from desired activities  / walking neighborhood / chasing around gchildren Cough: none/ no need for prns Sleeping: on side 2 pillows SABA use: none / minimal need for prednisone or any prn x xanax 02: none  rec F/u med calendar   09/09/2019  f/u ov/[name] re: asthma /uacs/ ? Adherent (no med calendar /does not recognize the one we gave her dated 01/05/2019  Chief Complaint  Patient presents with  . Follow-up    Breathing is doing well. She rarely uses her albuterol. She has minimal dry cough and wheezing.   Dyspnea:  Walking neighborhood / chasing kids  Cough: usually assoc  with nasal congestion clear mucus   Sleeping: fine on 2pillows on side SABA use: rarely 02: none  rec Automatically when you have a flare of coughing or short of breath >>>  add the second nebulizer treatment 12 hours after the 1st  If not effective >  Prednisone 10 mg take  4 each am x 2 days,  2 each am x 2 days,  1 each am x 2 days and stop     01/12/2020  f/u ov/[name] re:   Asthma with uacs / continues to struggle with concept of med reconciliation Chief Complaint  Patient presents with  . Follow-up    "feeling good"  Dyspnea:  Walking neighborhood, chasing kids at age 47 and 60 Cough: none / still hoarse and clearing throat daytime  Sleeping : no resp symptoms  02 none     No obvious day to day or daytime variability or assoc excess/ purulent sputum or mucus plugs or hemoptysis or cp or chest tightness, subjective wheeze or overt sinus or hb symptoms.   Sleeping  without nocturnal  or early am exacerbation  of respiratory  c/o's or need for noct saba. Also denies any obvious fluctuation of symptoms with weather or environmental changes or other aggravating or alleviating factors except as outlined above   No unusual exposure hx or h/o childhood pna/ asthma or knowledge of premature birth.  Current Allergies, Complete Past Medical History, Past Surgical History, Family History, and Social History were reviewed in [employer] record.  ROS  The following are not active complaints unless bolded Hoarseness, sore throat, dysphagia, dental problems, itching, sneezing,  nasal congestion or discharge of excess mucus or purulent secretions, ear ache,   fever, chills, sweats, unintended wt loss or wt gain, classically pleuritic or exertional cp,  orthopnea pnd or arm/hand swelling  or leg swelling, presyncope, palpitations, abdominal pain, anorexia, nausea, vomiting, diarrhea  or change in bowel habits or change in bladder habits, change in stools or change in urine, dysuria, hematuria,  rash, arthralgias,  visual complaints, headache, numbness, weakness or ataxia or problems with walking or coordination,  change in mood or  memory.        Current Meds  Medication Sig  . albuterol (PROAIR HFA) 108 (90 Base) MCG/ACT inhaler Inhale 2 puffs into the lungs every 4 (four) hours as needed for wheezing or shortness of breath.  Marland Kitchen albuterol (PROVENTIL) (2.5 MG/3ML) 0.083% nebulizer solution Take 2.5 mg by nebulization every 4 (four) hours as needed for wheezing or shortness of breath.   . ALPRAZolam (XANAX) 0.5 MG tablet Take 1 tablet (0.5 mg total) by mouth 2 (two) times daily as needed for anxiety.  Marland Kitchen aspirin EC 81 MG tablet Take 81 mg by mouth daily.   Marland Kitchen atorvastatin (LIPITOR) 40 MG tablet Take 40 mg by mouth at bedtime.  . Biotin 5000 MCG CAPS Take 1 capsule by mouth daily.  . budesonide (PULMICORT) 0.5 MG/2ML nebulizer solution USE 1 VIAL IN NEBULIZER ONCE DAILY  . chlorpheniramine (CHLOR-TRIMETON) 4 MG tablet Take 4 mg by mouth 2 (two) times daily as needed for allergies or rhinitis.   Marland Kitchen dextromethorphan (DELSYM) 30 MG/5ML liquid Take 60 mg by mouth daily as needed for cough. 2 tsp every 12 hours as needed w/ flutter   . diltiazem (CARTIA XT) 240 MG 24 hr capsule Take 1 capsule (240 mg total) by mouth daily.  . divalproex (DEPAKOTE ER) 500 MG 24 hr tablet Take 2 tablets (1,000 mg total) by mouth at bedtime.  Marland Kitchen esomeprazole (NEXIUM) 20 MG capsule Take 20 mg by mouth every morning.   . hydrALAZINE (APRESOLINE) 25 MG tablet Take 1 tablet (25 mg total) by mouth 3 (three) times daily.  . memantine (NAMENDA) 10 MG tablet Take 1 tablet (10 mg total) by mouth 2 (two) times daily.  . metFORMIN (GLUCOPHAGE)  500 MG tablet Take 500 mg by mouth 2 (two) times daily with a meal.  . montelukast (SINGULAIR) 10 MG tablet Take 1 tablet (10 mg total) by mouth at bedtime.  . Multiple Vitamin (MULTIVITAMIN WITH MINERALS) TABS tablet Take 1 tablet by mouth daily.  . nebivolol (BYSTOLIC) 10 MG tablet Take 1 tablet (10 mg  total) by mouth daily.  . nitroGLYCERIN (NITROSTAT) 0.4 MG SL tablet DISSOLVE ONE TABLET UNDER THE TONGUE EVERY 5 MINUTES AS NEEDED FOR CHEST PAIN.  DO NOT EXCEED A TOTAL OF 3 DOSES IN 15 MINUTES  . PERFOROMIST 20 MCG/2ML nebulizer solution USE 1 VIAL  IN  NEBULIZER TWICE  DAILY - Morning and evening  . potassium chloride SA (K-DUR,KLOR-CON) 20 MEQ tablet Take 20 mEq by mouth daily.   Marland Kitchen Respiratory Therapy Supplies (FLUTTER) DEVI by Does not apply route.  . revefenacin (YUPELRI) 175 MCG/3ML nebulizer solution Take 3 mLs (175 mcg total) by nebulization daily.  Marland Kitchen torsemide (DEMADEX) 20 MG tablet Take 20 mg by mouth daily.                  Objective:   Physical Exam  01/12/2020      150  09/09/2019     147  03/12/2019   157  11/04/2018       157  02/23/2016   168 >  04/07/2016  168 > 05/08/2016    168 >  07/17/2016  162 > 09/18/2016    164  > 09/18/2017  160 > 12/28/2017 167  > 03/11/2018  164 > 03/20/2018   160 > 04/05/2018   171 > 04/18/2018  165     slt hoarse amb wf with prominent pseudowheeze   Vital signs reviewed  01/12/2020  - Note at rest 02 sats  100% on RA   HEENT : pt wearing mask not removed for exam due to covid -19 concerns.    NECK :  without JVD/Nodes/TM/ nl carotid upstrokes bilaterally   LUNGS: no acc muscle use,  Nl contour chest which is clear to A and P bilaterally without cough on insp or exp maneuvers   CV:  RRR  no s3 or murmur or increase in P2, and no edema   ABD:  soft and nontender with nl inspiratory excursion in the supine position. No bruits or organomegaly appreciated, bowel sounds nl  MS:  Nl gait/ ext warm without deformities, calf tenderness, cyanosis or clubbing No obvious joint restrictions   SKIN: warm and dry without lesions    NEURO:  alert, approp, nl sensorium with  no motor or cerebellar deficits apparent.

## 2020-01-13 ENCOUNTER — Telehealth: Payer: Self-pay | Admitting: Internal Medicine

## 2020-01-13 DIAGNOSIS — J410 Simple chronic bronchitis: Secondary | ICD-10-CM

## 2020-01-13 MED ORDER — FORMOTEROL FUMARATE 20 MCG/2ML IN NEBU: 20.0000 ug | INHALATION_SOLUTION | Freq: Two times a day (BID) | RESPIRATORY_TRACT | 11 refills | Status: DC

## 2020-01-13 MED ORDER — BUDESONIDE 0.5 MG/2ML IN SUSP: 0.5000 mg | Freq: Two times a day (BID) | RESPIRATORY_TRACT | 11 refills | Status: DC

## 2020-01-13 NOTE — Telephone Encounter (Signed)
Budesonide and perforomist neb sol have been sent to the preferred pharmacies listed by pt. Called and spoke with pt letting her know this had been done and she verbalized understanding. Nothing further needed.

## 2020-01-14 ENCOUNTER — Encounter: Payer: Self-pay | Admitting: Internal Medicine

## 2020-01-14 NOTE — Assessment & Plan Note (Addendum)
-   CT sinus 08/06/14 > neg  - Allergy profile 02/23/16  >  Eos 0.0 /  IgE  87 pos  RAST  Grass/ trees  - improved 05/08/2016 on gabapentin 300 tid > no change rx > d/c'd 07/2016 DgEs 10/09/17  Low range of penetration without aspiration. Otherwise, normal - Asthma exac 12/17/17- FENO 18. Significant upper airway wheeze, tx pred taper. Add neurontin 100mg  TID.  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 > rec max  gerd rx   - 04/05/2018 rechallenge with gabapentin 300  Qid  - PFT's  04/18/2018  FEV1 1.13 (68 % ) ratio 91  p 7 % improvement from saba p nothing prior to study with DLCO  53/60 % corrects to 147  % for alv volume  s curvature - 02/04/2019 informed only using gabapentin 300 mg hs so ok to d/c    Still  Component of uacs/ vcd here and not taking ppi as rec :  Change to 40 mg ac q am  And 20 mg ac q pm  /diet reviewed also

## 2020-01-14 NOTE — Assessment & Plan Note (Addendum)
Onset of symptoms reported  2012 p quit smoking  2005  - FEN0 01/24/2016 = 13 with active symptoms on symb 160 2bid > changed to dulera 100  - Spirometry 02/23/2016  NO signifiant obstruction with active symptoms  - 02/23/2016  After extensive coaching HFA effectiveness =    75%  - FENO 02/23/2016  =  12   On dulera 100 2bid (unable to confirm adeherence as did not bring it - Singulair 10 mg daily added 02/23/2016 to dulera 100 2bid with 2 week sample only  (unable to verify she used it) - Allergy profile 02/23/16  >  Eos 0.0 /  IgE  87 pos  RAST  Grass/ trees  - 04/07/2016   symb 80 x one sample given - FENO 04/07/2016  =   43 ? Really on meds ?  - 05/08/2016  After extensive coaching HFA effectiveness =    75% > continue symb 80 2bid - 07/17/2016 added singulair maint   - FENO 09/18/2016  =   15 on symb 160 2bid > try 80 2bid  - flare late feb 2019 ? What maint rx she was taking but on admit "non-adherent per notes 08/12/17  - 09/18/2017  After extensive coaching inhaler device  effectiveness =    75% from a baseline of 50% > restart symb 80 2bid - FENO 09/18/2017  =   20 on symb 160  - Spirometry 09/18/2017  No obstruction at all with active wheeze -  09/09/2019 added prednisone as "plan D"   All goals of chronic asthma control met including optimal function and elimination of symptoms with minimal need for rescue therapy.  Contingencies discussed in full including contacting this office immediately if not controlling the symptoms using the rule of two's.     Re med rec:   To keep things simple, I have asked the patient to first separate medicines that are perceived as maintenance, that is to be taken daily "no matter what", from those medicines that are taken on only on an as-needed basis and I have given the patient examples of both, and then return to see our NP to generate a  detailed  medication calendar which should be followed until the next physician sees the patient and updates it.            Each maintenance medication was reviewed in detail including emphasizing most importantly the difference between maintenance and prns and under what circumstances the prns are to be triggered using an action plan format where appropriate.  Total time for H and P, chart review, counseling, teaching device and generating customized AVS unique to this office visit / charting = 25 min

## 2020-01-14 NOTE — Progress Notes (Signed)
Patient ID: [name]                 DOB: [birth year]                      MRN: [id number]     HPI: [name] is a 69 y.o. female referred by Dr. Marlou Porch to HTN clinic. PMH is significant for HTN, HLD, CHF, CAD, NSTEMI, acute PE, stage III CKD.  Patient was last seen in HTN clinic on 12/31/2019. At that time, hydralazine was increased from 25 mg BID to 25 mg TID. Patient returns today for follow up. She is doing well with TID dosing, but may have missed the afternoon dose a couple times. She reports taking hydralazine at 8AM, 2-3PM, and 9-10PM. She remembers to take her dose by placing the bottle on the counter of her side of the bathroom. Patient is tolerating well and denies dizziness, lightheadedness, or headaches.  Patient forgot to bring her home blood pressure readings to clinic but reports systolic readings are usually in upper 150s. One day, BP was 179/78, but patient was not symptomatic and states she might have been a bit stressed out that day. Patient also reports one systolic reading in 793J. Heart rate is usually in 60s-70s. Office blood pressure today was 144/72 and heart rate was 91.  Patient has been increasing her exercise by taking longer and more frequent walks. She was previously walking 1x up and down the street a few days per week. Now she has increased to walking 2x up and down the street and does this every day. She wears a good pair of sneakers and has no issues with her knees.  Patient knows she eats too much salt. She states it is hard to not add salt to food, especially since the pandemic started. She complains that her food is too bland when her daughter uses [employer] D[employer] to cook. It is also difficult to maintain good diet since her husband has no dietary restrictions.  Current HTN meds: Hydralazine 25 mg three times daily. Nebivolol 10 mg PO daily. Diltiazem 240 mg PO daily. Toresmide 20 daily. Previously tried: Irbesartan, valsartan, valsartan/HCTZ, (thought to have  stopped ACE and ARB due to cough and asthma symptoms) metoprolol succinate, amlodipine previously on allergy list (unsure why, but maybe swelling) BP goal: <130/80  Family History: HTN (father), CAD (father, sister), CHF (sister)  Social History: Married with 3 children. Former smoker (quit 2005).  Diet: Adds salt to food  Exercise: Takes daily walks up and down her street 2x  Home BP readings: Reports systolic BP is usually in upper 150s. Was 179/78 one day but might have been due to stress (felt fine). Systolic was in 030S on another day. Heart rate in 60s-70s.   Wt Readings from Last 3 Encounters:  01/12/20 150 lb (68 kg)  12/08/19 149 lb 5 oz (67.7 kg)  10/03/19 152 lb 6.4 oz (69.1 kg)   BP Readings from Last 3 Encounters:  01/12/20 (!) 146/82  12/31/19 (!) 160/82  12/08/19 132/80   Pulse Readings from Last 3 Encounters:  01/12/20 66  12/08/19 66  10/03/19 69    Renal function: Scr was 1.03 and eGFR was >60 on 09/17/2019  CrCl cannot be calculated (Patient's most recent lab result is older than the maximum 21 days allowed.).  Past Medical History:  Diagnosis Date  . Anxiety   . Asthma   . Bronchitis   . Chronic diastolic  CHF (congestive heart failure) ([place])   . Chronic diastolic CHF (congestive heart failure) ([place]) 07/17/2016  . Coronary artery disease    a. NSTEMI 12/2015 - LHC 01/18/16: s/p overlapping DESx2 to RCA, 10% ost-prox Cx,10% mLAD.  Marland Kitchen Depression   . Diabetes mellitus   . Dyslipidemia   . GERD (gastroesophageal reflux disease)   . Heart attack ([place])   . Hypertension   . Hypertensive heart disease   . NSTEMI (non-ST elevated myocardial infarction) ([place]) 12/2015  . Pneumonia     Current Outpatient Medications on File Prior to Visit  Medication Sig Dispense Refill  . albuterol (PROAIR HFA) 108 (90 Base) MCG/ACT inhaler Inhale 2 puffs into the lungs every 4 (four) hours as needed for wheezing or shortness of breath. 18 g 5  . albuterol (PROVENTIL) (2.5  MG/3ML) 0.083% nebulizer solution Take 2.5 mg by nebulization every 4 (four) hours as needed for wheezing or shortness of breath.     . ALPRAZolam (XANAX) 0.5 MG tablet Take 1 tablet (0.5 mg total) by mouth 2 (two) times daily as needed for anxiety. 30 tablet 0  . aspirin EC 81 MG tablet Take 81 mg by mouth daily.     Marland Kitchen atorvastatin (LIPITOR) 40 MG tablet Take 40 mg by mouth at bedtime.    . Biotin 5000 MCG CAPS Take 1 capsule by mouth daily.    . budesonide (PULMICORT) 0.5 MG/2ML nebulizer solution Take 2 mLs (0.5 mg total) by nebulization in the morning and at bedtime. 60 mL 11  . chlorpheniramine (CHLOR-TRIMETON) 4 MG tablet Take 4 mg by mouth 2 (two) times daily as needed for allergies or rhinitis.     Marland Kitchen dextromethorphan (DELSYM) 30 MG/5ML liquid Take 60 mg by mouth daily as needed for cough. 2 tsp every 12 hours as needed w/ flutter     . diltiazem (CARTIA XT) 240 MG 24 hr capsule Take 1 capsule (240 mg total) by mouth daily. 90 capsule 3  . divalproex (DEPAKOTE ER) 500 MG 24 hr tablet Take 2 tablets (1,000 mg total) by mouth at bedtime. 90 tablet 4  . esomeprazole (NEXIUM) 20 MG capsule Take 20 mg by mouth every morning.     . formoterol (PERFOROMIST) 20 MCG/2ML nebulizer solution Take 2 mLs (20 mcg total) by nebulization 2 (two) times daily. 60 mL 11  . hydrALAZINE (APRESOLINE) 25 MG tablet Take 1 tablet (25 mg total) by mouth 3 (three) times daily. 270 tablet 3  . memantine (NAMENDA) 10 MG tablet Take 1 tablet (10 mg total) by mouth 2 (two) times daily. 60 tablet 11  . metFORMIN (GLUCOPHAGE) 500 MG tablet Take 500 mg by mouth 2 (two) times daily with a meal.    . montelukast (SINGULAIR) 10 MG tablet Take 1 tablet (10 mg total) by mouth at bedtime. 30 tablet 3  . Multiple Vitamin (MULTIVITAMIN WITH MINERALS) TABS tablet Take 1 tablet by mouth daily.    . nebivolol (BYSTOLIC) 10 MG tablet Take 1 tablet (10 mg total) by mouth daily. 34 tablet 11  . nitroGLYCERIN (NITROSTAT) 0.4 MG SL tablet  DISSOLVE ONE TABLET UNDER THE TONGUE EVERY 5 MINUTES AS NEEDED FOR CHEST PAIN.  DO NOT EXCEED A TOTAL OF 3 DOSES IN 15 MINUTES 25 tablet 0  . potassium chloride SA (K-DUR,KLOR-CON) 20 MEQ tablet Take 20 mEq by mouth daily.     Marland Kitchen Respiratory Therapy Supplies (FLUTTER) DEVI by Does not apply route.    . torsemide (DEMADEX) 20 MG tablet Take  20 mg by mouth daily.      No current facility-administered medications on file prior to visit.    No Known Allergies  There were no vitals taken for this visit.   Assessment/Plan:  1. Hypertension - Patient's blood pressure is above goal of <130/80 both at home and in office. We would like to start chlorthalidone in addition to patient's current BP medications. Patient was hesitant at first, as she does not want to increase the number of medications she is on. However, we explained that thiazide diuretics are an effective first-line agent for African American patients and patient was willing to try it. We considered whether we could switch from diltiazem to amlodipine, however we are hesitant to re-try amlodipine due to patient's history of swelling in ankles. We are also hesitant to re-try ACE inhibitors or ARBs due to her history of cough. Plan to draw labs today and initiate chlorthalidone 12.5 mg daily if renal function and electrolytes return normal. We will make no changes to any of her other medications and plan to follow up with patient in clinic in 2 weeks.    Thank you  Esmeralda Links (PharmD Candidate 2022)  Ramond Dial, Pharm.D, BCPS, CPP [facility] [facility]  [address] [place], [place] [address]  Phone: [phone]; Fax: [phone]

## 2020-01-15 ENCOUNTER — Other Ambulatory Visit: Payer: Self-pay

## 2020-01-15 ENCOUNTER — Ambulatory Visit (INDEPENDENT_AMBULATORY_CARE_PROVIDER_SITE_OTHER): Payer: Medicare Other | Admitting: Pharmacist

## 2020-01-15 VITALS — BP 144/72 | HR 91

## 2020-01-15 DIAGNOSIS — I10 Essential (primary) hypertension: Secondary | ICD-10-CM

## 2020-01-15 NOTE — Patient Instructions (Addendum)
Great to see you today!  We would like to START chlorthalidone 12.5 mg once daily, which is a first-line option for blood pressure lowering. We will call you tomorrow with your lab results and start this medication if everything comes back normal.  CONTINUE taking your other blood pressure medications as you have been (see below).  CONTINUE taking daily walks, and increasing the amount you walk as you are able to.  REDUCE the amount of salt that you add to your food, as this can really impact your blood pressure.  We will see you back in 2 weeks.   Your blood pressure medications are as follows: Hydralazine 25 mg three times daily Nebivolol 10 mg daily Diltiazem 240 mg daily Toresmide 20 daily (water pill)    Please call us if you have any questions or concerns: [phone]

## 2020-01-16 ENCOUNTER — Telehealth: Payer: Self-pay | Admitting: Pharmacist

## 2020-01-16 ENCOUNTER — Telehealth: Payer: Self-pay

## 2020-01-16 LAB — BASIC METABOLIC PANEL
BUN/Creatinine Ratio: 19 (ref 12–28)
BUN: 23 mg/dL (ref 8–27)
CO2: 23 mmol/L (ref 20–29)
Calcium: 9.6 mg/dL (ref 8.7–10.3)
Chloride: 101 mmol/L (ref 96–106)
Creatinine, Ser: 1.2 mg/dL — ABNORMAL HIGH (ref 0.57–1.00)
GFR calc Af Amer: 53 mL/min/{1.73_m2} — ABNORMAL LOW (ref >59)
GFR calc non Af Amer: 46 mL/min/{1.73_m2} — ABNORMAL LOW (ref >59)
Glucose: 115 mg/dL — ABNORMAL HIGH (ref 65–99)
Potassium: 4.4 mmol/L (ref 3.5–5.2)
Sodium: 140 mmol/L (ref 134–144)

## 2020-01-16 MED ORDER — CHLORTHALIDONE 25 MG PO TABS
12.5000 mg | ORAL_TABLET | Freq: Every day | ORAL | 11 refills | Status: DC
Start: 2020-01-16 — End: 2020-02-10

## 2020-01-16 NOTE — Telephone Encounter (Signed)
Contacted patient to informed her that her labs came back normal and we will start chlorthalidone 12.5 mg daily today. Will assess BP control at follow-up visit in 2 weeks.   Esmeralda Links (PharmD Candidate 2022)

## 2020-01-16 NOTE — Telephone Encounter (Signed)
Scr is pretty stable and electrolytes and WNL. Will start chlorthalidone 12.5mg  daily. Pt made aware. Rx sent to pharmacy. Follow up in 2 weeks in office and labs

## 2020-01-28 NOTE — Progress Notes (Signed)
Patient ID: [name]                 DOB: 12/22/1950                      MRN: [id number]     HPI: [name] is a 69 y.o. female referred by Dr. Marlou Porch to HTN clinic. PMH is significant for HTN, HLD, CHF, CAD, NSTEMI, acute PE, stage III CKD.  Patient was last seen in HTN clinic on 01/15/2020. Labs drawn that day were stable and patient was started on chlorthalidone 12.5 mg daily due to reported home systolic BP readings usually in upper 150s with one reading at 179/78 (not symptomatic). Patient's office BP was also above goal at 144/72.   Today, patient returns to HTN clinic for follow-up in good spirits. Patient is doing well on chlorthalidone. She has 25 mg tablets that she has been cutting in half for the 12.5 mg dose. Patient denies dizziness, headaches, and chest pain. Patient does report some swelling in her ankles that have recently worsened despite continuing torsemide daily. Patient also reports that she was unusually sleepy yesterday and that her granddaughter could not shake her awake. Patient's drowsiness caused her to sleep through picking up her great-grandson from school and miss Bible study last night due to concerns with driving. Patient is better today but still a little sleepy. She did get 3 root canals done last Monday (9/20) and was given amoxicillin, metronidazole and Motrin. Patient was told not to take Motrin due to cardiovascular risks, so was given Percocet instead. Patient last took Percocet several days ago and did not take a dose yesterday when she was sleepy.  Patient brought home BP readings to clinic today. Readings fluctuate between 120-150s/60-70s. Patient states that her BP cuff usually shows readings that are higher than her husband's cuff, even after changing the batteries of her cuff. For example, she may have a reading 159/79 on her cuff but 142/68 on her husband's cuff. Patient will be sent a new machine that should come in today. Office BP today 144/72 and  140/70 on repeat. HR 66.  Current HTN meds: Chlorthalidone 12.5 mg daily. Hydralazine 25 mg three times daily. Nebivolol 10 mg PO daily. Diltiazem 240 mg PO daily. Toresmide 20 mg daily. Previously tried: Irbesartan, valsartan, valsartan/HCTZ, (thought to have stopped ACE and ARB due to cough and asthma symptoms) metoprolol succinate, amlodipine previously on allergy list (unsure why, but maybe swelling) BP goal: <130/80  Family History: HTN (father), CAD (father, sister), CHF (sister)  Social History: Married with 3 children. Former smoker (quit 2005).  Diet: Adds salt to food; complains food is too bland with Mrs. [employer]ASH  Exercise: Takes daily walks up and down her street 2x  Home BP readings: 149/71, 129/58, 128/64, 148/68, 157/78, 152/75, 148/64, 122/69, 153/75, 136/72, 128/63  HR in 60s-70s   Wt Readings from Last 3 Encounters:  01/12/20 150 lb (68 kg)  12/08/19 149 lb 5 oz (67.7 kg)  10/03/19 152 lb 6.4 oz (69.1 kg)   BP Readings from Last 3 Encounters:  01/15/20 (!) 144/72  01/12/20 (!) 146/82  12/31/19 (!) 160/82   Pulse Readings from Last 3 Encounters:  01/15/20 91  01/12/20 66  12/08/19 66    Renal function: Scr was 1.03 and eGFR was >60 on 09/17/2019  CrCl cannot be calculated (Unknown ideal weight.).  Past Medical History:  Diagnosis Date  . Anxiety   . Asthma   .  Bronchitis   . Chronic diastolic CHF (congestive heart failure) ([place])   . Chronic diastolic CHF (congestive heart failure) ([place]) 07/17/2016  . Coronary artery disease    a. NSTEMI 12/2015 - LHC 01/18/16: s/p overlapping DESx2 to RCA, 10% ost-prox Cx,10% mLAD.  Marland Kitchen Depression   . Diabetes mellitus   . Dyslipidemia   . GERD (gastroesophageal reflux disease)   . Heart attack ([place])   . Hypertension   . Hypertensive heart disease   . NSTEMI (non-ST elevated myocardial infarction) ([place]) 12/2015  . Pneumonia     Current Outpatient Medications on File Prior to Visit  Medication Sig Dispense Refill  .  albuterol (PROAIR HFA) 108 (90 Base) MCG/ACT inhaler Inhale 2 puffs into the lungs every 4 (four) hours as needed for wheezing or shortness of breath. 18 g 5  . albuterol (PROVENTIL) (2.5 MG/3ML) 0.083% nebulizer solution Take 2.5 mg by nebulization every 4 (four) hours as needed for wheezing or shortness of breath.     . ALPRAZolam (XANAX) 0.5 MG tablet Take 1 tablet (0.5 mg total) by mouth 2 (two) times daily as needed for anxiety. 30 tablet 0  . aspirin EC 81 MG tablet Take 81 mg by mouth daily.     Marland Kitchen atorvastatin (LIPITOR) 40 MG tablet Take 40 mg by mouth at bedtime.    . Biotin 5000 MCG CAPS Take 1 capsule by mouth daily.    . budesonide (PULMICORT) 0.5 MG/2ML nebulizer solution Take 2 mLs (0.5 mg total) by nebulization in the morning and at bedtime. 60 mL 11  . chlorpheniramine (CHLOR-TRIMETON) 4 MG tablet Take 4 mg by mouth 2 (two) times daily as needed for allergies or rhinitis.     . chlorthalidone (HYGROTON) 25 MG tablet Take 0.5 tablets (12.5 mg total) by mouth daily. 30 tablet 11  . dextromethorphan (DELSYM) 30 MG/5ML liquid Take 60 mg by mouth daily as needed for cough. 2 tsp every 12 hours as needed w/ flutter     . diltiazem (CARTIA XT) 240 MG 24 hr capsule Take 1 capsule (240 mg total) by mouth daily. 90 capsule 3  . divalproex (DEPAKOTE ER) 500 MG 24 hr tablet Take 2 tablets (1,000 mg total) by mouth at bedtime. 90 tablet 4  . esomeprazole (NEXIUM) 20 MG capsule Take 20 mg by mouth every morning.     . formoterol (PERFOROMIST) 20 MCG/2ML nebulizer solution Take 2 mLs (20 mcg total) by nebulization 2 (two) times daily. 60 mL 11  . hydrALAZINE (APRESOLINE) 25 MG tablet Take 1 tablet (25 mg total) by mouth 3 (three) times daily. 270 tablet 3  . memantine (NAMENDA) 10 MG tablet Take 1 tablet (10 mg total) by mouth 2 (two) times daily. 60 tablet 11  . metFORMIN (GLUCOPHAGE) 500 MG tablet Take 500 mg by mouth 2 (two) times daily with a meal.    . montelukast (SINGULAIR) 10 MG tablet Take  1 tablet (10 mg total) by mouth at bedtime. 30 tablet 3  . Multiple Vitamin (MULTIVITAMIN WITH MINERALS) TABS tablet Take 1 tablet by mouth daily.    . nebivolol (BYSTOLIC) 10 MG tablet Take 1 tablet (10 mg total) by mouth daily. 34 tablet 11  . nitroGLYCERIN (NITROSTAT) 0.4 MG SL tablet DISSOLVE ONE TABLET UNDER THE TONGUE EVERY 5 MINUTES AS NEEDED FOR CHEST PAIN.  DO NOT EXCEED A TOTAL OF 3 DOSES IN 15 MINUTES 25 tablet 0  . potassium chloride SA (K-DUR,KLOR-CON) 20 MEQ tablet Take 20 mEq by mouth daily.     Marland Kitchen  Respiratory Therapy Supplies (FLUTTER) DEVI by Does not apply route.    . torsemide (DEMADEX) 20 MG tablet Take 20 mg by mouth daily.      No current facility-administered medications on file prior to visit.    No Known Allergies  There were no vitals taken for this visit.   Assessment/Plan:  1. Hypertension - Patient's office BP today is above goal of <130/80. Unclear if her home BP readings are accurate, given differences between her cuff and her husband's cuff. However, based on reported readings, home BP is improving but still above goal most days. As patient has also been drowsy over past few days, we will make no medication changes today. Continue chlorthalidone 12.5 mg daily, hydralazine 25 mg three times daily, nebivolol 10 mg PO daily, diltiazem 240 mg PO daily, and toresmide 20 mg daily. We will monitor to see if symptoms of fatigue and sleepiness resolve before increasing any doses, although I'm not convinced yet its related to her blood pressure medications. Counseled patient to continue monitoring her home BP after getting her new cuff. I also asked her to bring her BP cuff into the clinic so we can calibrate it. Patient will see Dr. Marlou Porch on 10/12, I encourage him to make changes as he feels fit and and we will plan to follow up in clinic in 4 weeks (10/28). If BP readings on new cuff are still above goal, can consider increasing chlorthalidone to 25 mg daily at that time.  Checking BMP today to make sure scr and electrolytes are stable after starting chlorthalidone.  Counseled patient to use compression stockings and elevate her feet to reduce swelling. If needed, patient can also take an extra dose of torsemide for 1-2 days.    Thank you  Esmeralda Links (PharmD Candidate 2022)  Ramond Dial, Pharm.D, BCPS, CPP [facility]  [address] [place], [place] [address]  Phone: [phone]; Fax: [phone]

## 2020-01-29 ENCOUNTER — Ambulatory Visit (INDEPENDENT_AMBULATORY_CARE_PROVIDER_SITE_OTHER): Payer: Medicare Other | Admitting: Pharmacist

## 2020-01-29 ENCOUNTER — Other Ambulatory Visit: Payer: Self-pay

## 2020-01-29 VITALS — BP 140/70 | HR 66

## 2020-01-29 DIAGNOSIS — I10 Essential (primary) hypertension: Secondary | ICD-10-CM

## 2020-01-29 NOTE — Patient Instructions (Addendum)
Great to see you today!  We will make NO medication changes today. For now, continue taking your blood pressure medications as you have been:  Chlorthalidone 12.5 mg (half tablet) daily Hydralazine 25 mg three times daily Nebivolol 10 mg PO daily Diltiazem 240 mg PO daily Toresmide 20 mg daily  Please bring your new blood pressure monitor to your next visit with Dr. Marlou Porch to compare your cuff to his office cuff. Continue to record your daily readings as well. Let us know if you continue to feel sleepy and fatigued, or if you have any other symptoms such as dizziness, lightheadedness, or headaches.  You can wear compression stockings and elevate your feet to see if this helps with the swelling in your ankles. If not, you can also take an extra dose of torsemide for 1-2 days max. Let us know if your swelling worsens.  We would like to see you back in the pharmacy clinic about 4 weeks.    Please call us if you have any questions or concerns: [phone]

## 2020-01-30 LAB — BASIC METABOLIC PANEL
BUN/Creatinine Ratio: 19 (ref 12–28)
BUN: 23 mg/dL (ref 8–27)
CO2: 29 mmol/L (ref 20–29)
Calcium: 9.9 mg/dL (ref 8.7–10.3)
Chloride: 99 mmol/L (ref 96–106)
Creatinine, Ser: 1.2 mg/dL — ABNORMAL HIGH (ref 0.57–1.00)
GFR calc Af Amer: 53 mL/min/{1.73_m2} — ABNORMAL LOW (ref >59)
GFR calc non Af Amer: 46 mL/min/{1.73_m2} — ABNORMAL LOW (ref >59)
Glucose: 130 mg/dL — ABNORMAL HIGH (ref 65–99)
Potassium: 4.1 mmol/L (ref 3.5–5.2)
Sodium: 143 mmol/L (ref 134–144)

## 2020-02-10 ENCOUNTER — Other Ambulatory Visit: Payer: Self-pay

## 2020-02-10 ENCOUNTER — Encounter: Payer: Self-pay | Admitting: Cardiology

## 2020-02-10 ENCOUNTER — Ambulatory Visit (INDEPENDENT_AMBULATORY_CARE_PROVIDER_SITE_OTHER): Payer: Medicare Other | Admitting: Cardiology

## 2020-02-10 VITALS — BP 134/70 | HR 64 | Ht 61.0 in | Wt 147.8 lb

## 2020-02-10 DIAGNOSIS — I10 Essential (primary) hypertension: Secondary | ICD-10-CM | POA: Diagnosis not present

## 2020-02-10 DIAGNOSIS — I2510 Atherosclerotic heart disease of native coronary artery without angina pectoris: Secondary | ICD-10-CM

## 2020-02-10 MED ORDER — CHLORTHALIDONE 25 MG PO TABS
25.0000 mg | ORAL_TABLET | Freq: Every day | ORAL | 11 refills | Status: DC
Start: 2020-02-10 — End: 2021-02-03

## 2020-02-10 NOTE — Progress Notes (Signed)
Cardiology Office Note:    Date:  02/10/2020   ID:  [name], DOB 12/16/1950, MRN [id number]  PCP:  [name], MD  [facility] Cardiologist:  Candee Furbish, MD  [facility] Electrophysiologist:  None   Referring MD: [name], MD     History of Present Illness:    [name] is a 69 y.o. female here for the follow-up of hypertension with comorbidities of CAD prior non-STEMI, prior PE stage III chronic kidney disease.  She has been seen by the hypertension clinic, most recent note from 01/15/2020 reviewed.  Previously tried: Irbesartan, valsartan, valsartan/HCTZ, (thought to have stopped ACE and ARB due to cough and asthma symptoms) metoprolol succinate, amlodipine previously on allergy list (unsure why, but maybe swelling)  Blood pressure continues to still be high.  At hypertension visit she was asked to start chlorthalidone but she was hesitant.  She is also hesitant to retry ACE inhibitor's or ARBs because of the cough.  They did try to start chlorthalidone 12.5 mg a day.  She revisited with him and it was determined to continue with  chlorthalidone 12.5 Hydralazine 25 3 times a day Nebivolol 10 mg a day Diltiazem 240 mg a day Torsemide 20 mg a day  Plan can be to increase chlorthalidone to 25 if necessary.  Past Medical History:  Diagnosis Date  . Anxiety   . Asthma   . Bronchitis   . Chronic diastolic CHF (congestive heart failure) ([place])   . Chronic diastolic CHF (congestive heart failure) ([place]) 07/17/2016  . Coronary artery disease    a. NSTEMI 12/2015 - LHC 01/18/16: s/p overlapping DESx2 to RCA, 10% ost-prox Cx,10% mLAD.  Marland Kitchen Depression   . Diabetes mellitus   . Dyslipidemia   . GERD (gastroesophageal reflux disease)   . Heart attack ([place])   . Hypertension   . Hypertensive heart disease   . NSTEMI (non-ST elevated myocardial infarction) ([place]) 12/2015  . Pneumonia     Past Surgical History:  Procedure Laterality Date  . ABDOMINAL  HYSTERECTOMY    . CARDIAC CATHETERIZATION  1994   minimal LAD dz, no other dz, EF normal  . CARDIAC CATHETERIZATION N/A 01/18/2016   Procedure: Left Heart Cath and Coronary Angiography;  Surgeon: Burnell Blanks, MD;  Location: [place] [facility];  Service: Cardiovascular;  Laterality: N/A;  . CARDIAC CATHETERIZATION N/A 01/18/2016   Procedure: Coronary Stent Intervention;  Surgeon: Burnell Blanks, MD;  Location: [place] [facility];  Service: Cardiovascular;  Laterality: N/A;  . COLONOSCOPY WITH PROPOFOL N/A 04/09/2014   Procedure: COLONOSCOPY WITH PROPOFOL;  Surgeon: Cleotis Nipper, MD;  Location: [facility];  Service: Endoscopy;  Laterality: N/A;  . CORONARY STENT PLACEMENT  01/19/2016   STENT SYNERGY DES 3.01S01 drug eluting stent was successfully placed, and overlaps the 2.5 x 38 mm Synergy stent placed distally.  . ESOPHAGOGASTRODUODENOSCOPY (EGD) WITH PROPOFOL N/A 04/09/2014   Procedure: ESOPHAGOGASTRODUODENOSCOPY (EGD) WITH PROPOFOL;  Surgeon: Cleotis Nipper, MD;  Location: [facility];  Service: Endoscopy;  Laterality: N/A;  . HEMORRHOID SURGERY    . HERNIA REPAIR    . KNEE ARTHROSCOPY      Current Medications: Current Meds  Medication Sig  . albuterol (PROAIR HFA) 108 (90 Base) MCG/ACT inhaler Inhale 2 puffs into the lungs every 4 (four) hours as needed for wheezing or shortness of breath.  Marland Kitchen albuterol (PROVENTIL) (2.5 MG/3ML) 0.083% nebulizer solution Take 2.5 mg by nebulization every 4 (four) hours as needed for  wheezing or shortness of breath.   . ALPRAZolam (XANAX) 0.5 MG tablet Take 1 tablet (0.5 mg total) by mouth 2 (two) times daily as needed for anxiety.  Marland Kitchen aspirin EC 81 MG tablet Take 81 mg by mouth daily.   Marland Kitchen atorvastatin (LIPITOR) 40 MG tablet Take 40 mg by mouth at bedtime.  . Biotin 5000 MCG CAPS Take 1 capsule by mouth daily.  . budesonide (PULMICORT) 0.5 MG/2ML nebulizer solution Take 2 mLs (0.5 mg total) by nebulization in the morning and at  bedtime.  . chlorpheniramine (CHLOR-TRIMETON) 4 MG tablet Take 4 mg by mouth 2 (two) times daily as needed for allergies or rhinitis.   . chlorthalidone (HYGROTON) 25 MG tablet Take 1 tablet (25 mg total) by mouth daily.  Marland Kitchen dextromethorphan (DELSYM) 30 MG/5ML liquid Take 60 mg by mouth daily as needed for cough. 2 tsp every 12 hours as needed w/ flutter   . diltiazem (CARTIA XT) 240 MG 24 hr capsule Take 1 capsule (240 mg total) by mouth daily.  . divalproex (DEPAKOTE ER) 500 MG 24 hr tablet Take 2 tablets (1,000 mg total) by mouth at bedtime. (Patient taking differently: Take 500 mg by mouth daily. )  . esomeprazole (NEXIUM) 40 MG capsule Take 40 mg by mouth daily at 12 noon.  . formoterol (PERFOROMIST) 20 MCG/2ML nebulizer solution Take 2 mLs (20 mcg total) by nebulization 2 (two) times daily.  . hydrALAZINE (APRESOLINE) 25 MG tablet Take 1 tablet (25 mg total) by mouth 3 (three) times daily.  . memantine (NAMENDA) 10 MG tablet Take 1 tablet (10 mg total) by mouth 2 (two) times daily.  . metFORMIN (GLUCOPHAGE) 500 MG tablet Take 500 mg by mouth 2 (two) times daily with a meal.  . montelukast (SINGULAIR) 10 MG tablet Take 1 tablet (10 mg total) by mouth at bedtime.  . Multiple Vitamin (MULTIVITAMIN WITH MINERALS) TABS tablet Take 1 tablet by mouth daily.  . nebivolol (BYSTOLIC) 10 MG tablet Take 1 tablet (10 mg total) by mouth daily.  . nitroGLYCERIN (NITROSTAT) 0.4 MG SL tablet DISSOLVE ONE TABLET UNDER THE TONGUE EVERY 5 MINUTES AS NEEDED FOR CHEST PAIN.  DO NOT EXCEED A TOTAL OF 3 DOSES IN 15 MINUTES  . potassium chloride SA (K-DUR,KLOR-CON) 20 MEQ tablet Take 20 mEq by mouth daily.   Marland Kitchen Respiratory Therapy Supplies (FLUTTER) DEVI by Does not apply route.  . torsemide (DEMADEX) 20 MG tablet Take 20 mg by mouth daily.   . [DISCONTINUED] chlorthalidone (HYGROTON) 25 MG tablet Take 0.5 tablets (12.5 mg total) by mouth daily.     Allergies:   Patient has no known allergies.   Social History    Socioeconomic History  . Marital status: Married    Spouse name: Not on file  . Number of children: 3  . Years of education: some college  . Highest education level: Not on file  Occupational History  . Occupation: Nutritional therapist  Tobacco Use  . Smoking status: Former Smoker    Packs/day: 1.00    Years: 30.00    Pack years: 30.00    Types: Cigarettes    Quit date: 01/09/2004    Years since quitting: 16.0  . Smokeless tobacco: Never Used  Vaping Use  . Vaping Use: Never used  Substance and Sexual Activity  . Alcohol use: No    Alcohol/week: 0.0 standard drinks  . Drug use: No  . Sexual activity: Not on file  Other Topics Concern  . Not on file  Social History Narrative   Pt lives with husband in [place].   Right-handed.   2 cups caffeine per day.   Social Determinants of Health   Financial Resource Strain:   . Difficulty of Paying Living Expenses: Not on file  Food Insecurity:   . Worried About Charity fundraiser in the Last Year: Not on file  . Ran Out of Food in the Last Year: Not on file  Transportation Needs:   . Lack of Transportation (Medical): Not on file  . Lack of Transportation (Non-Medical): Not on file  Physical Activity:   . Days of Exercise per Week: Not on file  . Minutes of Exercise per Session: Not on file  Stress:   . Feeling of Stress : Not on file  Social Connections:   . Frequency of Communication with Friends and Family: Not on file  . Frequency of Social Gatherings with Friends and Family: Not on file  . Attends Religious Services: Not on file  . Active Member of Clubs or Organizations: Not on file  . Attends Archivist Meetings: Not on file  . Marital Status: Not on file     Family History: The patient's family history includes Allergies in her mother; Asthma in her mother; CAD (age of onset: 57) in her father; CAD (age of onset: 38) in her sister; Congestive Heart Failure in her sister; Diabetes in her father; Hypertension  in her father.  ROS:   Please see the history of present illness.     All other systems reviewed and are negative.  EKGs/Labs/Other Studies Reviewed:    The following studies were reviewed today:  Echo 08/13/2017-EF 70% Recent Labs: 03/20/2019: TSH 0.994 09/17/2019: Hemoglobin 10.8; Platelets 268 01/29/2020: BUN 23; Creatinine, Ser 1.20; Potassium 4.1; Sodium 143  Recent Lipid Panel    Component Value Date/Time   CHOL 196 07/18/2016 0142   TRIG 203 (H) 07/18/2016 0142   HDL 48 07/18/2016 0142   CHOLHDL 4.1 07/18/2016 0142   VLDL 41 (H) 07/18/2016 0142   LDLCALC 107 (H) 07/18/2016 0142     Risk Assessment/Calculations:       Physical Exam:    VS:  BP 134/70   Pulse 64   Ht 5\' 1"  (1.549 m)   Wt 147 lb 12.8 oz (67 kg)   LMP  (LMP Unknown)   SpO2 96%   BMI 27.93 kg/m     Wt Readings from Last 3 Encounters:  02/10/20 147 lb 12.8 oz (67 kg)  01/12/20 150 lb (68 kg)  12/08/19 149 lb 5 oz (67.7 kg)     GEN:  Well nourished, well developed in no acute distress HEENT: Normal NECK: No JVD; No carotid bruits LYMPHATICS: No lymphadenopathy CARDIAC: RRR, no murmurs, rubs, gallops RESPIRATORY:  Clear to auscultation without rales, wheezing or rhonchi  ABDOMEN: Soft, non-tender, non-distended MUSCULOSKELETAL:  No edema; No deformity  SKIN: Warm and dry NEUROLOGIC:  Alert and oriented x 3 PSYCHIATRIC:  Normal affect   ASSESSMENT:    1. Coronary artery disease involving native heart without angina pectoris, unspecified vessel or lesion type   2. Essential hypertension    PLAN:    In order of problems listed above:  Coronary artery disease -Myocardial infarction 2017 2 stents to RCA.  Aspirin, statin, Bystolic.  Aggressive secondary prevention.  No change in medical therapy.  No anginal symptoms.  Essential hypertension -As described above in detail. -Appreciate hypertension clinic assistance. --Increase Chlorthalidone 25mg  p.o. daily. -Check basic metabolic  profile in 2 weeks.  Last creatinine 1.2 potassium 4.1 on 01/29/2020. -I am very pleased with her progress.  Non-ST elevation myocardial infarction -September 2017-cath RCA disease with DES x2  Asthma -Dr. Melvyn Novas, FEV1 68%  History of seizures followed by neurology  She is up seeing the pharmacy team as well today.  Appreciated.  They checked her blood pressure cuff for reproducibility.  I would like to see her back in 3 months.   Medication Adjustments/Labs and Tests Ordered: Current medicines are reviewed at length with the patient today.  Concerns regarding medicines are outlined above.  Orders Placed This Encounter  Procedures  . Basic metabolic panel   Meds ordered this encounter  Medications  . chlorthalidone (HYGROTON) 25 MG tablet    Sig: Take 1 tablet (25 mg total) by mouth daily.    Dispense:  30 tablet    Refill:  11    Patient Instructions  Medication Instructions:  No changes *If you need a refill on your cardiac medications before your next appointment, please call your pharmacy*   Lab Work: BMET in 2 weeks. If you have labs (blood work) drawn today and your tests are completely normal, you will receive your results only by: Marland Kitchen MyChart Message (if you have MyChart) OR . A paper copy in the mail If you have any lab test that is abnormal or we need to change your treatment, we will call you to review the results.   Testing/Procedures: none   Follow-Up: At [facility], you and your health needs are our priority.  As part of our continuing mission to provide you with exceptional heart care, we have created designated Provider Care Teams.  These Care Teams include your primary Cardiologist (physician) and Advanced Practice Providers (APPs -  Physician Assistants and Nurse Practitioners) who all work together to provide you with the care you need, when you need it.   Your next appointment:   3 month(s)  The format for your next appointment:   In  Person  Provider:   You may see Candee Furbish, MD or one of the following Advanced Practice Providers on your designated Care Team:    Truitt Merle, NP  Cecilie Kicks, NP  Kathyrn Drown, NP    Other Instructions      Signed, Candee Furbish, MD  02/10/2020 2:52 PM    [place]

## 2020-02-10 NOTE — Patient Instructions (Addendum)
Medication Instructions:  No changes *If you need a refill on your cardiac medications before your next appointment, please call your pharmacy*   Lab Work: BMET in 2 weeks. If you have labs (blood work) drawn today and your tests are completely normal, you will receive your results only by:  [place] (if you have MyChart) OR  A paper copy in the mail If you have any lab test that is abnormal or we need to change your treatment, we will call you to review the results.   Testing/Procedures: none   Follow-Up: At [facility], you and your health needs are our priority.  As part of our continuing mission to provide you with exceptional heart care, we have created designated Provider Care Teams.  These Care Teams include your primary Cardiologist (physician) and Advanced Practice Providers (APPs -  Physician Assistants and Nurse Practitioners) who all work together to provide you with the care you need, when you need it.   Your next appointment:   3 month(s)  The format for your next appointment:   In Person  Provider:   You may see Candee Furbish, MD or one of the following Advanced Practice Providers on your designated Care Team:    Truitt Merle, NP  Cecilie Kicks, NP  Kathyrn Drown, NP    Other Instructions

## 2020-02-12 ENCOUNTER — Other Ambulatory Visit: Payer: Self-pay | Admitting: Pulmonary Disease

## 2020-02-12 DIAGNOSIS — J301 Allergic rhinitis due to pollen: Secondary | ICD-10-CM

## 2020-02-12 DIAGNOSIS — J4550 Severe persistent asthma, uncomplicated: Secondary | ICD-10-CM

## 2020-02-15 ENCOUNTER — Other Ambulatory Visit: Payer: Self-pay | Admitting: Cardiology

## 2020-02-17 MED ORDER — DILTIAZEM HCL ER COATED BEADS 240 MG PO CP24: 240.0000 mg | ORAL_CAPSULE | Freq: Every day | ORAL | 3 refills | Status: DC

## 2020-02-23 ENCOUNTER — Ambulatory Visit: Payer: Medicare Other | Admitting: Pulmonary Disease

## 2020-02-23 ENCOUNTER — Ambulatory Visit: Payer: Medicare Other | Admitting: Adult Health

## 2020-02-25 ENCOUNTER — Encounter: Payer: Self-pay | Admitting: Podiatry

## 2020-02-25 ENCOUNTER — Other Ambulatory Visit: Payer: Self-pay

## 2020-02-25 ENCOUNTER — Ambulatory Visit: Payer: Medicare Other | Admitting: Podiatry

## 2020-02-25 DIAGNOSIS — E119 Type 2 diabetes mellitus without complications: Secondary | ICD-10-CM

## 2020-02-25 DIAGNOSIS — B351 Tinea unguium: Secondary | ICD-10-CM | POA: Diagnosis not present

## 2020-02-25 DIAGNOSIS — M79674 Pain in right toe(s): Secondary | ICD-10-CM

## 2020-02-25 DIAGNOSIS — M79675 Pain in left toe(s): Secondary | ICD-10-CM | POA: Diagnosis not present

## 2020-02-25 NOTE — Progress Notes (Signed)
[name] ID: [name]                 DOB: 01-Jul-1950                      MRN: [id number]     HPI: [name] is a 69 y.o. female referred by Dr. Marlou Porch to HTN clinic. PMH is significant for HTN, HLD, CHF (EF 60-65% consistently, last 2019), CAD, NSTEMI, acute PE, stage III CKD.  [name]'s medications were last altered 01/15/20 after reporting home BP readings consistently in Jacqueline upper 150s. At this time, chlorthalidone was started at a dose of 12.5mg  daily. [name] was last seen in clinic 01/29/20, but medication changes were deferred despite home BP readings ranging [id number] systolic, as [name] reported excessive drowsiness & fatigue in addition to increased LEE. [name] was then seen by Dr. Gillian Shields 02/10/20 who increased her chlorthalidone to 25mg  daily after BMET was stable and symptoms had resolved.    [name] arrives in good spirits today. She states that she went to Jacqueline doctor yesterday and was told that her BP was 124/74, HR 63. [name] has not taken her blood pressure and forgot her home log. She denies dizziness or lightheadedness when standing, headaches, blurred vision, or swelling. [name] does state that she often forgets to take her mid-day hydralazine, but otherwise endorses doing well with her medications. When asked why she was previously taken off amlodipine, [name] states that she cannot remember and does not remember having LEE with a medication.   Of note, [name] has been dealing with severe tooth discomfort. She has had 3 root canals w/ crowns. One of Jacqueline crowns was not fitting correctly which was cause Jacqueline majority of her pain, but she thinks that since this was corrected, her pain has been improving and is not severe today.  Current HTN meds: Chlorthalidone 25 mg daily. Hydralazine 25 mg three times daily. Nebivolol 10 mg PO daily. Diltiazem 240 mg PO daily. Toresmide 20 mg daily Previously tried: Irbesartan, valsartan, valsartan/HCTZ,  (thought to have stopped ACE and ARB due to cough and asthma symptoms) metoprolol succinate, amlodipine previously on allergy list (unsure why, but maybe swelling) BP goal: <130/80  Family History: HTN (father), CAD (father, sister), CHF (sister)  Social History: Married with 3 children. Former smoker (quit 2005).  Diet: Adds salt to food; complains food is too bland with Mrs. DASH - Change: soft diet because of tooth pain, possible increase in salt from soup though trying to choose low sodium options.  Exercise: Takes daily walks up and down her street 2x  - 20 minutes of walking / day   Home BP readings: Typically in Jacqueline 147W systolic   Wt Readings from Last 3 Encounters:  02/10/20 147 lb 12.8 oz (67 kg)  01/12/20 150 lb (68 kg)  12/08/19 149 lb 5 oz (67.7 kg)   BP Readings from Last 3 Encounters:  02/10/20 134/70  01/29/20 140/70  01/15/20 (!) 144/72   Pulse Readings from Last 3 Encounters:  02/10/20 64  01/29/20 66  01/15/20 91   BMP Latest Ref Rng & Units 01/29/2020 01/15/2020 09/17/2019  Glucose 65 - 99 mg/dL 130(H) 115(H) 205(H)  BUN 8 - 27 mg/dL 23 23 27(H)  Creatinine 0.57 - 1.00 mg/dL 1.20(H) 1.20(H) 1.03(H)  BUN/Creat Ratio 12 - 28 19 19  -  Sodium 134 - 144 mmol/L 143 140 135  Potassium 3.5 - 5.2 mmol/L  4.1 4.4 4.3  Chloride 96 - 106 mmol/L 99 101 100  CO2 20 - 29 mmol/L 29 23 25   Calcium 8.7 - 10.3 mg/dL 9.9 9.6 9.1   Renal function: CrCl cannot be calculated ([name]'s most recent lab result is older than Jacqueline maximum 21 days allowed.).  Past Medical History:  Diagnosis Date  . Anxiety   . Asthma   . Bronchitis   . Chronic diastolic CHF (congestive heart failure) ([place])   . Chronic diastolic CHF (congestive heart failure) ([place]) 07/17/2016  . Coronary artery disease    a. NSTEMI 12/2015 - LHC 01/18/16: s/p overlapping DESx2 to RCA, 10% ost-prox Cx,10% mLAD.  Marland Kitchen Depression   . Diabetes mellitus   . Dyslipidemia   . GERD (gastroesophageal reflux disease)     . Heart attack ([place])   . Hypertension   . Hypertensive heart disease   . NSTEMI (non-ST elevated myocardial infarction) ([place]) 12/2015  . Pneumonia     Current Outpatient Medications on File Prior to Visit  Medication Sig Dispense Refill  . albuterol (PROAIR HFA) 108 (90 Base) MCG/ACT inhaler Inhale 2 puffs into Jacqueline lungs every 4 (four) hours as needed for wheezing or shortness of breath. 18 g 5  . albuterol (PROVENTIL) (2.5 MG/3ML) 0.083% nebulizer solution Take 2.5 mg by nebulization every 4 (four) hours as needed for wheezing or shortness of breath.     . ALPRAZolam (XANAX) 0.5 MG tablet Take 1 tablet (0.5 mg total) by mouth 2 (two) times daily as needed for anxiety. 30 tablet 0  . aspirin EC 81 MG tablet Take 81 mg by mouth daily.     Marland Kitchen atorvastatin (LIPITOR) 40 MG tablet Take 40 mg by mouth at bedtime.    . Biotin 5000 MCG CAPS Take 1 capsule by mouth daily.    . budesonide (PULMICORT) 0.5 MG/2ML nebulizer solution Take 2 mLs (0.5 mg total) by nebulization in Jacqueline morning and at bedtime. 60 mL 11  . chlorpheniramine (CHLOR-TRIMETON) 4 MG tablet Take 4 mg by mouth 2 (two) times daily as needed for allergies or rhinitis.     . chlorthalidone (HYGROTON) 25 MG tablet Take 1 tablet (25 mg total) by mouth daily. 30 tablet 11  . dextromethorphan (DELSYM) 30 MG/5ML liquid Take 60 mg by mouth daily as needed for cough. 2 tsp every 12 hours as needed w/ flutter     . diltiazem (CARTIA XT) 240 MG 24 hr capsule Take 1 capsule (240 mg total) by mouth daily. 90 capsule 3  . divalproex (DEPAKOTE ER) 500 MG 24 hr tablet Take 2 tablets (1,000 mg total) by mouth at bedtime. ([name] taking differently: Take 500 mg by mouth daily. ) 90 tablet 4  . esomeprazole (NEXIUM) 40 MG capsule Take 40 mg by mouth daily at 12 noon.    . formoterol (PERFOROMIST) 20 MCG/2ML nebulizer solution Take 2 mLs (20 mcg total) by nebulization 2 (two) times daily. 60 mL 11  . hydrALAZINE (APRESOLINE) 25 MG tablet Take 1 tablet (25  mg total) by mouth 3 (three) times daily. 270 tablet 3  . memantine (NAMENDA) 10 MG tablet Take 1 tablet (10 mg total) by mouth 2 (two) times daily. 60 tablet 11  . metFORMIN (GLUCOPHAGE) 500 MG tablet Take 500 mg by mouth 2 (two) times daily with a meal.    . montelukast (SINGULAIR) 10 MG tablet TAKE 1 TABLET BY MOUTH AT BEDTIME 30 tablet 5  . Multiple Vitamin (MULTIVITAMIN WITH MINERALS) TABS tablet Take 1  tablet by mouth daily.    . nebivolol (BYSTOLIC) 10 MG tablet Take 1 tablet (10 mg total) by mouth daily. 34 tablet 11  . nitroGLYCERIN (NITROSTAT) 0.4 MG SL tablet DISSOLVE ONE TABLET UNDER Jacqueline TONGUE EVERY 5 MINUTES AS NEEDED FOR CHEST PAIN.  DO NOT EXCEED A TOTAL OF 3 DOSES IN 15 MINUTES 25 tablet 0  . potassium chloride SA (K-DUR,KLOR-CON) 20 MEQ tablet Take 20 mEq by mouth daily.     Marland Kitchen Respiratory Therapy Supplies (FLUTTER) DEVI by Does not apply route.    . torsemide (DEMADEX) 20 MG tablet Take 20 mg by mouth daily.      No current facility-administered medications on file prior to visit.    No Known Allergies  Vitals obtained today: BP: 144/76 HR: 57 SPO2: 97%  Assessment/Plan:  1. Hypertension -  [name]'s BP remains elevated above goal today (<130/80) despite recent titration of chlorthalidone. [name] seems to be tolerating her current BP regimen without symptoms. Will obtain BMET today to confirm scr and electrolytes are stable post chlorthalidone titration. Given [name]'s continuously elevated BP, will increase hydralazine to 50 mg TID. Encouraged [name] to set alarm on her phone for mid-day dose. Encouraged continued efforts to decrease salt intake & use of BP cuff/log. Commended [name] on exercise regimen and encouraged Jacqueline continuation of her walks despite Jacqueline increasingly cold weather.   Repeat BMET today with f/u visit scheduled November 24 13:30.  Thank you,  Otho Darner, Pharmacy Student class of [address] [place], [place].D, BCPS, CPP [place]  [address], [place], [place] [address]  Phone: [phone]; Fax: [phone]

## 2020-02-26 ENCOUNTER — Encounter: Payer: Self-pay | Admitting: Adult Health

## 2020-02-26 ENCOUNTER — Ambulatory Visit (INDEPENDENT_AMBULATORY_CARE_PROVIDER_SITE_OTHER): Payer: Medicare Other | Admitting: Pharmacist

## 2020-02-26 ENCOUNTER — Other Ambulatory Visit: Payer: Medicare Other | Admitting: *Deleted

## 2020-02-26 ENCOUNTER — Ambulatory Visit (INDEPENDENT_AMBULATORY_CARE_PROVIDER_SITE_OTHER): Payer: Medicare Other | Admitting: Adult Health

## 2020-02-26 VITALS — BP 144/76 | HR 57

## 2020-02-26 DIAGNOSIS — J4530 Mild persistent asthma, uncomplicated: Secondary | ICD-10-CM | POA: Diagnosis not present

## 2020-02-26 DIAGNOSIS — I2510 Atherosclerotic heart disease of native coronary artery without angina pectoris: Secondary | ICD-10-CM

## 2020-02-26 DIAGNOSIS — I10 Essential (primary) hypertension: Secondary | ICD-10-CM

## 2020-02-26 MED ORDER — HYDRALAZINE HCL 50 MG PO TABS
50.0000 mg | ORAL_TABLET | Freq: Three times a day (TID) | ORAL | 3 refills | Status: DC
Start: 2020-02-26 — End: 2020-03-24

## 2020-02-26 NOTE — Progress Notes (Signed)
@Patient  ID: [name], female    DOB: 1951/04/16, 69 y.o.   MRN: [id number]  Chief Complaint  Patient presents with  . Follow-up    Asthma     Referring provider: [name], MD  HPI: 69 year old female former smoker followed for asthma and chronic cough Medical history significant for coronary disease diabetes diastolic heart failure and seizure disorder  TEST/EVENTS :  FEN0 01/24/2016= 13 with active symptoms on symb 160 2bid >changed to dulera 100  - Spirometry 02/23/2016 NO signifiant obstruction with active symptoms  - FENO 02/23/2016= 12 On dulera 100 2bid (unable to confirm adeherence as did not bring it - Singulair 10 mg daily added 10/25/2017to dulera 100 2bid with 2 week sample only (unable to verify she used it) - FENO 04/07/2016= 43 -CTa CHest 2017 >neg PE . 4 mm RUL nodule  CT sinus 2016 negGEN IgE 87 2017,positive [place] grass, dust, tree High-resolution CT chest April 2018--NEGfor ILD, stable 4 mm right upper lobe nodule. Echo 07/2017 EF 65%,Gr 2 DD , PAP 54mmHg  Spirometry 08/2017 Moderate restriction , no obstruction practice ~  02/26/2020 Follow up : Asthma, Chronic cough  Patient presents for a 1 month follow-up.  Patient has underlying asthma and chronic cough.  Patient says overall she is doing well.  She is had no flare of cough or wheezing.  She remains on budesonide and Perforomist twice daily.  She is had no increased albuterol use.  Her Covid vaccines are up-to-date including a booster.  Her flu shot is up-to-date.  We reviewed all her medications and organize them into a medication calendar.  She appears to be taking her medications correctly.    No Known Allergies  Immunization History  Administered Date(s) Administered  . Fluad Quad(high Dose 65+) 02/06/2020  . Influenza Split 03/10/2012, 03/14/2015  . Influenza, High Dose Seasonal PF 02/04/2017, 01/04/2018, 12/31/2018  . Influenza-Unspecified 01/11/2016, 01/07/2019  .  PFIZER SARS-COV-2 Vaccination 05/16/2019, 06/16/2019, 02/06/2020  . Zoster Recombinat (Shingrix) 06/11/2017, 08/28/2017, 09/27/2017    Past Medical History:  Diagnosis Date  . Anxiety   . Asthma   . Bronchitis   . Chronic diastolic CHF (congestive heart failure) ([place])   . Chronic diastolic CHF (congestive heart failure) ([place]) 07/17/2016  . Coronary artery disease    a. NSTEMI 12/2015 - LHC 01/18/16: s/p overlapping DESx2 to RCA, 10% ost-prox Cx,10% mLAD.  Marland Kitchen Depression   . Diabetes mellitus   . Dyslipidemia   . GERD (gastroesophageal reflux disease)   . Heart attack ([place])   . Hypertension   . Hypertensive heart disease   . NSTEMI (non-ST elevated myocardial infarction) ([place]) 12/2015  . Pneumonia     Tobacco History: Social History   Tobacco Use  Smoking Status Former Smoker  . Packs/day: 1.00  . Years: 30.00  . Pack years: 30.00  . Types: Cigarettes  . Quit date: 01/09/2004  . Years since quitting: 16.1  Smokeless Tobacco Never Used   Counseling given: Not Answered   Outpatient Medications Prior to Visit  Medication Sig Dispense Refill  . albuterol (PROAIR HFA) 108 (90 Base) MCG/ACT inhaler Inhale 2 puffs into the lungs every 4 (four) hours as needed for wheezing or shortness of breath. 18 g 5  . albuterol (PROVENTIL) (2.5 MG/3ML) 0.083% nebulizer solution Take 2.5 mg by nebulization every 4 (four) hours as needed for wheezing or shortness of breath.     . ALPRAZolam (XANAX) 0.5 MG tablet Take 1 tablet (0.5 mg total) by mouth  2 (two) times daily as needed for anxiety. 30 tablet 0  . aspirin EC 81 MG tablet Take 81 mg by mouth daily.     Marland Kitchen atorvastatin (LIPITOR) 40 MG tablet Take 40 mg by mouth at bedtime.    . Biotin 5000 MCG CAPS Take 1 capsule by mouth daily.    . budesonide (PULMICORT) 0.5 MG/2ML nebulizer solution Take 2 mLs (0.5 mg total) by nebulization in the morning and at bedtime. 60 mL 11  . chlorpheniramine (CHLOR-TRIMETON) 4 MG tablet Take 4 mg by mouth 2 (two)  times daily as needed for allergies or rhinitis.     . chlorthalidone (HYGROTON) 25 MG tablet Take 1 tablet (25 mg total) by mouth daily. 30 tablet 11  . dextromethorphan (DELSYM) 30 MG/5ML liquid Take 60 mg by mouth daily as needed for cough. 2 tsp every 12 hours as needed w/ flutter     . diltiazem (CARTIA XT) 240 MG 24 hr capsule Take 1 capsule (240 mg total) by mouth daily. 90 capsule 3  . divalproex (DEPAKOTE ER) 500 MG 24 hr tablet Take 2 tablets (1,000 mg total) by mouth at bedtime. (Patient taking differently: Take 500 mg by mouth daily. ) 90 tablet 4  . esomeprazole (NEXIUM) 40 MG capsule Take 40 mg by mouth daily at 12 noon.    . formoterol (PERFOROMIST) 20 MCG/2ML nebulizer solution Take 2 mLs (20 mcg total) by nebulization 2 (two) times daily. 60 mL 11  . hydrALAZINE (APRESOLINE) 50 MG tablet Take 1 tablet (50 mg total) by mouth 3 (three) times daily. 270 tablet 3  . latanoprost (XALATAN) 0.005 % ophthalmic solution     . LUMIGAN 0.01 % SOLN 1 drop at bedtime.    . memantine (NAMENDA) 10 MG tablet Take 1 tablet (10 mg total) by mouth 2 (two) times daily. 60 tablet 11  . metFORMIN (GLUCOPHAGE) 500 MG tablet Take 500 mg by mouth 2 (two) times daily with a meal.    . montelukast (SINGULAIR) 10 MG tablet TAKE 1 TABLET BY MOUTH AT BEDTIME 30 tablet 5  . Multiple Vitamin (MULTIVITAMIN WITH MINERALS) TABS tablet Take 1 tablet by mouth daily.    . nebivolol (BYSTOLIC) 10 MG tablet Take 1 tablet (10 mg total) by mouth daily. 34 tablet 11  . nitroGLYCERIN (NITROSTAT) 0.4 MG SL tablet DISSOLVE ONE TABLET UNDER THE TONGUE EVERY 5 MINUTES AS NEEDED FOR CHEST PAIN.  DO NOT EXCEED A TOTAL OF 3 DOSES IN 15 MINUTES 25 tablet 0  . potassium chloride SA (K-DUR,KLOR-CON) 20 MEQ tablet Take 20 mEq by mouth daily.     Marland Kitchen Respiratory Therapy Supplies (FLUTTER) DEVI by Does not apply route.    . torsemide (DEMADEX) 20 MG tablet Take 20 mg by mouth daily.     . traMADol (ULTRAM) 50 MG tablet Take 50 mg by mouth  every 4 (four) hours as needed.     No facility-administered medications prior to visit.     Review of Systems:   Constitutional:   No  weight loss, night sweats,  Fevers, chills, + fatigue, or  lassitude.  HEENT:   No headaches,  Difficulty swallowing,  Tooth/dental problems, or  Sore throat,                No sneezing, itching, ear ache, nasal congestion, post nasal drip,   CV:  No chest pain,  Orthopnea, PND, swelling in lower extremities, anasarca, dizziness, palpitations, syncope.   GI  No heartburn, indigestion, abdominal  pain, nausea, vomiting, diarrhea, change in bowel habits, loss of appetite, bloody stools.   Resp:    No excess mucus, no productive cough,  No non-productive cough,  No coughing up of blood.  No change in color of mucus.  No wheezing.  No chest wall deformity  Skin: no rash or lesions.  GU: no dysuria, change in color of urine, no urgency or frequency.  No flank pain, no hematuria   MS:  No joint pain or swelling.  No decreased range of motion.  No back pain.    Physical Exam  BP 124/64 (BP Location: Left Arm, Patient Position: Sitting, Cuff Size: Normal)   Pulse 71   Temp 98.3 F (36.8 C) (Temporal)   Ht 5\' 1"  (1.549 m)   Wt 151 lb 6.4 oz (68.7 kg)   LMP  (LMP Unknown)   SpO2 99%   BMI 28.61 kg/m   GEN: A/Ox3; pleasant , NAD, well nourished    HEENT:  [place]/AT,    NOSE-clear, THROAT-clear, no lesions, no postnasal drip or exudate noted.   NECK:  Supple w/ fair ROM; no JVD; normal carotid impulses w/o bruits; no thyromegaly or nodules palpated; no lymphadenopathy.    RESP  Clear  P & A; w/o, wheezes/ rales/ or rhonchi. no accessory muscle use, no dullness to percussion  CARD:  RRR, no m/r/g, no peripheral edema, pulses intact, no cyanosis or clubbing.  GI:   Soft & nt; nml bowel sounds; no organomegaly or masses detected.   Musco: Warm bil, no deformities or joint swelling noted.   Neuro: alert, no focal deficits noted.    Skin: Warm, no  lesions or rashes    Lab Results:  CBC  Imaging: No results found.    PFT Results Latest Ref Rng & Units 04/18/2018  FVC-Pre L 1.19  FVC-Predicted Pre % 55  FVC-Post L 1.25  FVC-Predicted Post % 58  Pre FEV1/FVC % % 89  Post FEV1/FCV % % 91  FEV1-Pre L 1.05  FEV1-Predicted Pre % 64  FEV1-Post L 1.13  DLCO uncorrected ml/min/mmHg 11.21  DLCO UNC% % 53  DLCO corrected ml/min/mmHg 12.60  DLCO COR %Predicted % 60  DLVA Predicted % 147  TLC L 2.73  TLC % Predicted % 58  RV % Predicted % 64    Lab Results  Component Value Date   NITRICOXIDE 18 11/16/2017        Assessment & Plan:   Mild persistent chronic asthma without complication Controlled on current regimen Patient's medications were reviewed today and patient education was given. Computerized medication calendar was adjusted/completed    Plan  Patient Instructions  Follow med calendar closely and bring to each visit  Follow up Dr. Melvyn Novas  Or Aaliah Jorgenson NP In 4 month and As needed   Please contact office for sooner follow up if symptoms do not improve or worsen or seek emergency care          Rexene Edison, NP 02/26/2020

## 2020-02-26 NOTE — Assessment & Plan Note (Signed)
Controlled on current regimen Patient's medications were reviewed today and patient education was given. Computerized medication calendar was adjusted/completed    Plan  Patient Instructions  Follow med calendar closely and bring to each visit  Follow up Dr. Melvyn Novas  Or Lanelle Lindo NP In 4 month and As needed   Please contact office for sooner follow up if symptoms do not improve or worsen or seek emergency care

## 2020-02-26 NOTE — Patient Instructions (Addendum)
Thank you for seeing [place] today!  Your blood pressure is still elevated today. We would like you to do the following:  INCREASE hydralazine to 50mg  three x a day. You can take two (2) of the 25mg  tablets at a time until you pick up your new prescription.  We will get repeat labs from you today and call you with the results.  We will see you again November 24th, 1:30PM.  If you need [place], call us at [phone].  Thank you! Abby

## 2020-02-26 NOTE — Patient Instructions (Signed)
Follow med calendar closely and bring to each visit  Follow up Dr. Melvyn Novas  Or Diara Chaudhari NP In 4 month and As needed   Please contact office for sooner follow up if symptoms do not improve or worsen or seek emergency care

## 2020-02-27 LAB — BASIC METABOLIC PANEL
BUN/Creatinine Ratio: 25 (ref 12–28)
BUN: 31 mg/dL — ABNORMAL HIGH (ref 8–27)
CO2: 29 mmol/L (ref 20–29)
Calcium: 10 mg/dL (ref 8.7–10.3)
Chloride: 96 mmol/L (ref 96–106)
Creatinine, Ser: 1.26 mg/dL — ABNORMAL HIGH (ref 0.57–1.00)
GFR calc Af Amer: 50 mL/min/{1.73_m2} — ABNORMAL LOW (ref >59)
GFR calc non Af Amer: 43 mL/min/{1.73_m2} — ABNORMAL LOW (ref >59)
Glucose: 150 mg/dL — ABNORMAL HIGH (ref 65–99)
Potassium: 3.7 mmol/L (ref 3.5–5.2)
Sodium: 141 mmol/L (ref 134–144)

## 2020-02-28 NOTE — Progress Notes (Signed)
Subjective: [name] presents today for follow up of preventative diabetic foot care and painful mycotic nails b/l that are difficult to trim. Pain interferes with ambulation. Aggravating factors include wearing enclosed shoe gear. Pain is relieved with periodic professional debridement.  She voices no new pedal problems on today's visit. She states she and her family went on a fishing trip and she really enjoyed it.   No Known Allergies   Objective: There were no vitals filed for this visit.  Vascular Examination:  Capillary refill time to digits immediate b/l, palpable DP pulses b/l, palpable PT pulses b/l, pedal hair present b/l and skin temperature gradient within normal limits b/l  Dermatological Examination: Pedal skin with normal turgor, texture and tone bilaterally, no open wounds bilaterally, toenails 1-5 b/l elongated, dystrophic, thickened, crumbly with subungual debris and there is resolution of tinea pedis noted b/l.  Musculoskeletal: Normal muscle strength 5/5 to all lower extremity muscle groups bilaterally, no gross bony deformities bilaterally, no pain crepitus or joint limitation noted with ROM b/l and patient ambulates independent of any assistive aids  Neurological: Protective sensation intact 5/5 intact bilaterally with 10g monofilament b/l and vibratory sensation intact b/l.  Hemoglobin A1C Latest Ref Rng & Units 09/14/2019  HGBA1C 4.8 - 5.6 % 6.5(H)  Some recent data might be hidden    Assessment: 1. Pain due to onychomycosis of toenails of both feet   2. Controlled type 2 diabetes mellitus without complication, without long-term current use of insulin ([place])    Plan: -Examined patient. -No new findings. No new orders. -Continue diabetic foot care principles. -Toenails 1-5 b/l were debrided in length and girth with sterile nail nippers and dremel without iatrogenic bleeding.  -Patient to report any pedal injuries to medical professional  immediately. -Patient to continue soft, supportive shoe gear daily. -Patient/POA to call should there be question/concern in the interim.  Return in about 3 months (around 05/27/2020) for diabetic nail trim.

## 2020-03-12 ENCOUNTER — Ambulatory Visit: Payer: Medicare Other | Admitting: Internal Medicine

## 2020-03-24 ENCOUNTER — Ambulatory Visit (INDEPENDENT_AMBULATORY_CARE_PROVIDER_SITE_OTHER): Payer: Medicare Other | Admitting: Pharmacist

## 2020-03-24 ENCOUNTER — Other Ambulatory Visit: Payer: Self-pay

## 2020-03-24 VITALS — BP 140/70 | HR 62

## 2020-03-24 DIAGNOSIS — I10 Essential (primary) hypertension: Secondary | ICD-10-CM

## 2020-03-24 NOTE — Patient Instructions (Signed)
It was so nice to see you today!  Please continue: Chlorthalidone 25 mg daily. Hydralazine 50 mg two times daily. Nebivolol 10 mg PO daily. Diltiazem 240 mg PO daily. Toresmide 20 mg daily  Please bring your blood pressure cuff with you to your next vist along with your book you write your readings in  Call me at [phone] with any questions.  Hypertension "High blood pressure"  Hypertension is often called The Silent Killer. It rarely causes symptoms until it is extremely  high or has done damage to other organs in the body. For this reason, you should have your  blood pressure checked regularly by your physician. We will check your blood pressure  every time you see a provider at one of our offices.   Your blood pressure reading consists of two numbers. Ideally, blood pressure should be  below 120/80. The first (top) number is called the systolic pressure. It measures the  pressure in your arteries as your heart beats. The second (bottom) number is called the diastolic pressure. It measures the pressure in your arteries as the heart relaxes between beats.  The benefits of getting your blood pressure under control are enormous. A 10-point  reduction in systolic blood pressure can reduce your risk of stroke by 27% and heart failure by 28%  Your blood pressure goal is <130/80  To check your pressure at home you will need to:  1. Sit up in a chair, with feet flat on the floor and back supported. Do not cross your ankles or legs. 2. Rest your left arm so that the cuff is about heart level. If the cuff goes on your upper arm,  then just relax the arm on the table, arm of the chair or your lap. If you have a wrist cuff, we  suggest relaxing your wrist against your chest (think of it as Pledging the Flag with the  wrong arm).  3. Place the cuff snugly around your arm, about 1 inch above the crook of your elbow. The  cords should be inside the groove of your elbow.  4. Sit  quietly, with the cuff in place, for about 5 minutes. After that 5 minutes press the power  button to start a reading. 5. Do not talk or move while the reading is taking place.  6. Record your readings on a sheet of paper. Although most cuffs have a memory, it is often  easier to see a pattern developing when the numbers are all in front of you.  7. You can repeat the reading after 1-3 minutes if it is recommended  Make sure your bladder is empty and you have not had caffeine or tobacco within the last 30 min  Always bring your blood pressure log with you to your appointments. If you have not brought your monitor in to be double checked for accuracy, please bring it to your next appointment.  You can find a list of validated (accurate) blood pressure cuffs at PopPath.it  Healthy Diet  SALT  What is the big deal with sodium? Why the need to limit our intake? What is the connection to  blood pressure? And what is the difference between salt and sodium? Sodium attracts water. Think about it. When you eat an overly salty snack, you tend to become  thirsty and need more water. If you have too much sodium in your bloodstream, your body will  then pull water into the bloodstream as well, trying to correct the imbalance.  When you have  more volume in the bloodstream, your blood pressure goes up. Your heart must work harder to  pump the extra volume, and the increase in pressure can wear out blood vessels faster. You may  also notice bloating and weight gain. Hypertension is one of the leading risk factors for heart  disease. By limiting sodium intake throughout life you are helping decrease your risk of heart  disease later on.   Salt is made up of two minerals. Sodium and chloride. A teaspoon of salt contains about 40%  sodium and 60% chloride. One teaspoon of salt has 2,300 mg of sodium. While the current  USDA guideline states you should consume no more than 2,300 mg per day, both they  and the  American Heart Association recommend that you limit this to 1,500 mg to stay healthy. Sea salt  or [place] pink salt may have a slightly different taste, but they still have almost the same  percent of sodium per teaspoon. So feel free to use them instead of table salt, but dont use  more.  A common myth is that if you dont add salt to your food, you are following a low sodium diet.  However, 75% of the sodium consumed in the American diet is from processed foods, NOT the  salt shaker. We all know that chips and crackers are high in sodium, but there are many other  foods that we may not think of when limiting our sodium. Below are the salty six foods that  the American Heart Association wants you to be aware of. 1. Cold cuts - even the healthy sliced [place] can have over 1,000 mg of sodium per slice.   Compare different brands to see which has less sodium if you eat these regularly 2. Pizza - depending on your toppings, a slice of pizza can have up to 760 mg of   sodium. Put more veggies on it or just have a slice with a side salad and still enjoy. 3. Soup - yes even that old home remedy of chicken soup is loaded with sodium. Look   for low sodium versions. Or add a bunch of frozen veggies when heating it, this will   give you less sodium per serving 4. Breads - they may not taste salty, but a single slice of bread can have up to 230 mg of   sodium. Toast for breakfast, a sandwich at lunch and a dinner roll can quickly add up   to over 900 mg in just one day.  5. Chicken - some fresh or frozen chicken is injected with a sodium solution before it   reaches the store. A 4 oz serving should have no more than 100 mg sodium. And   watch for breaded frozen chicken nuggets, strips and tenders. They may seem like a   quick and easy healthy meal, but they have high amounts of sodium as well. 6. Burritos/tacos - just 2 teaspoons of taco seasoning can have over 400 mg sodium.   Try  making your own with equal parts of cumin, oregano, chili powder and garlic powder.   SUGAR  Sugar is a huge problem in the modern day diet. Sugar is a HUGE contributor to heart disease, diabetes, high triglyceride levels, fatty liver diease and obesity. Sugar is hidden in almost all packaged foods/beverages. It adds no nutritional benefit to your body and can cause major harm. Added sugar is extra sugar that is added beyond what is  naturally found. The American Heart Association recommends limiting added sugars to no more than 25g for women and 36 grams for men per day.  There are many names for sugar maltose, sucrose (names ending in "ose"), high fructose corn syrup, molasses, cane sugar, corn sweetener, raw sugar, syrup, honey or fruit juice concentrate.   One of the best ways to limit your added sugars is to stop drinking sweetened beverages such as soda, sweet tea, fruit juice or fancy coffee's. There is 65g of added sugars in one 20oz bottle of Coke!! That is equal to 6 donuts.   Pay attention and read all nutrition facts labels. Below is an examples of a nutrition facts label. The #1 is showing you the total sugars where the # 2 is showing you the added sugars. This one severing has almost the max amount of added sugars per day!  Watch out for items that say "low fat" or "no added sugar" as these products are typically very high in sugar. The food industry uses these terms to fool you into thinking they are healthy.  For more information on the dangers of sugar watch WHY Sugar is as Bad as Alcohol (Fructose, The Liver Toxin) on YouTube.    EXERCISE  Exercise can help lower your blood pressure ~5 points systolic (top #) and 8 points diastolic (bottom #)  Exercise is good. Weve all heard that. In an ideal world, we would all have time and resources to  get plenty of it. When you are active your heart pumps more efficiently and you will feel better.  Multiple studies show that even  walking regularly has benefits that include living a longer life.  The American Heart Association recommends 90-150 minutes per week of exercise (30 minutes  per day most days of the week). You can do this in any increment you wish. Nine or more  10-minute walks count. So does an hour-long exercise class. Break the time apart into what will  work in your life. Some of the best things you can do include walking briskly, jogging, cycling or  swimming laps. Not everyone is ready to exercise. Sometimes we need to start with just getting active. Here  are some easy ways to be more active throughout the day:  Take the stairs instead of the elevator  Go for a 10-15 minute walk during your lunch break (find a friend to make it more enjoyable)  When shopping, park at the back of the parking lot  If you take public transportation, get off one stop early and walk the extra distance  Pace around while making phone calls (most of [place] are not attached to phone cords any longer!) Check with your doctor if you arent sure what your limitations may be. Always remember to drink plenty of water when doing any type of exercise. Dont feel like a failure if youre not getting the 90-150 minutes per week. If you started by being  a couch potato, then just a 10-minute walk each day is a huge improvement. Start with little  victories and work your way up.   Healthy Eating Tips  When looking to improve your eating habits, whether to lose weight, lower blood pressure or just be healthier, it helps to know what a serving size is.   Grains 1 slice of bread,  bagel,  cup pasta or rice  Vegetables 1 cup fresh or raw vegetables,  cup cooked or canned Fruits 1 piece of medium sized fruit,  cup  canned,   Meats/Proteins  cup dried       1 oz meat, 1 egg,  cup cooked beans, nuts or seeds  Dairy        Fats Individual yogurt container, 1 cup (8oz)    1 teaspoon margarine/butter or vegetable  milk or milk  alternative, 1 slice of cheese          oil; 1 tablespoon mayonnaise or salad dressing                  Plan ahead: make a menu of the meals for a week then create a grocery list to go with  that menu. Consider meals that easily stretch into a night of leftovers, such as stews or  casseroles. Or consider making two of your favorite meal and put one in the freezer for  another night. Try a night or two each week that is meatless or no cook such as salads.  When you get home from the grocery store wash and prepare your vegetables and fruits.  Then when you need them they are ready to go.  Tips for going to the grocery store:  [employer] [place] [employer] store or generic brands  Check the weekly ad from your store on-line or in their in-store flyer  Look at the unit price on the shelf tag to compare/contrast the costs of different items  Buy fruits/vegetables in season  Carrots, bananas and apples are low-cost, naturally healthy items  If meats or frozen vegetables are on sale, buy some extras and put in your freezer  Limit buying prepared or ready to eat items, even if they are pre-made salads or fruit snacks  Do not shop when youre hungry  Foods at eye level tend to be more expensive. Look on the high and low shelves for deals.  Consider shopping at the farmers market for fresh foods in season.  Choose canned tuna or salmon instead of fresh  Avoid the cookie and chip aisles (these are expensive, high in calories and low in  nutritional value) Healthy food preparations:  If you cant get lean hamburger, be sure to drain the fat when cooking  Steam, saut (in olive oil), grill or bake foods  Experiment with different seasonings to avoid adding salt to your foods. Kosher salt, sea salt and [place] salt are all still salt and should be avoided         Resources: American Heart Association - InstantFinish.fi Go to the Healthy Living tab to get more information American Diabetes  Association - www.diabetes.org You dont have to be diabetic - check out the Food and Fitness tab  DASH diet - https://wilson-eaton.com/ Health topics - or just search on their home page for DASH Quit for Life - www.cancer.org Follow the Stay Healthy tab to learn more about smoking cessatio

## 2020-03-24 NOTE — Progress Notes (Signed)
Patient ID: [name]                 DOB: 10/15/1950                      MRN: [id number]     HPI: [name] is a 69 y.o. female referred by Dr. Marlou [name] to HTN clinic. PMH is significant for HTN, HLD, CHF (EF 60-65% consistently, last 2019), CAD, NSTEMI, acute PE, stage III CKD.  The patient's medications were last altered 02/26/20 after reporting home BP readings consistently in the 140's. BP in the office was 144/76. Hydralazine was increased to 50mg  TID. She was encouraged to set an alarm to remind her of her mid day dose of hydralazine because she reported missing it often. Blood pressure when she saw pulmonary a few hours later on the same day as [place] was 124/64.  Patient presents today accompanied by her daughter. She states that she kept forgetting to take the middle dose of hydralazine that she decided to just stop taking it. She states that she has not noticed a difference in her blood pressure. She checked her blood pressure at various times to make sure. She did forgot her log, but states her BP has been in the 120's/70's since increasing hydralazine to 50mg . Home monitor has been calibrated previously and found to be accurate. She has not gone for a walk in about 2 week due to GI issues that appear to have resolved. Denies  dizziness, lightheadedness, headache, blurred vision, SOB or swelling. Blood pressure in clinic today 140/70. Checked 2 times and same result each time.  Current HTN meds: Chlorthalidone 25 mg daily. Hydralazine 50 mg three times daily. Nebivolol 10 mg PO daily. Diltiazem 240 mg PO daily. Toresmide 20 mg daily Previously tried: Irbesartan, valsartan, valsartan/HCTZ, (thought to have stopped ACE and ARB due to cough and asthma symptoms) metoprolol succinate, amlodipine previously on allergy list (unsure why, but maybe swelling) BP goal: <130/80  Family History: HTN (father), CAD (father, sister), CHF (sister)  Social History: Married with 3 children. Former  smoker (quit 2005).  Diet: Adds salt to food; complains food is too bland with Mrs. DASH - Change: soft diet because of tooth pain, possible increase in salt from soup though trying to choose low sodium options.  Exercise: Takes daily walks up and down her street 2x  - 20 minutes of walking / day   Home BP readings: Typically in the 163W systolic   Wt Readings from Last 3 Encounters:  02/26/20 151 lb 6.4 oz (68.7 kg)  02/10/20 147 lb 12.8 oz (67 kg)  01/12/20 150 lb (68 kg)   BP Readings from Last 3 Encounters:  02/26/20 124/64  02/26/20 (!) 144/76  02/10/20 134/70   Pulse Readings from Last 3 Encounters:  02/26/20 71  02/26/20 (!) 57  02/10/20 64   BMP Latest Ref Rng & Units 02/26/2020 01/29/2020 01/15/2020  Glucose 65 - 99 mg/dL 150(H) 130(H) 115(H)  BUN 8 - 27 mg/dL 31(H) 23 23  Creatinine 0.57 - 1.00 mg/dL 1.26(H) 1.20(H) 1.20(H)  BUN/Creat Ratio 12 - 28 25 19 19   Sodium 134 - 144 mmol/L 141 143 140  Potassium 3.5 - 5.2 mmol/L 3.7 4.1 4.4  Chloride 96 - 106 mmol/L 96 99 101  CO2 20 - 29 mmol/L 29 29 23   Calcium 8.7 - 10.3 mg/dL 10.0 9.9 9.6   Renal function: CrCl cannot be calculated (Patient's most recent lab result  is older than the maximum 21 days allowed.).  Past Medical History:  Diagnosis Date   Anxiety    Asthma    Bronchitis    Chronic diastolic CHF (congestive heart failure) (HCC)    Chronic diastolic CHF (congestive heart failure) ([place]) 07/17/2016   Coronary artery disease    a. NSTEMI 12/2015 - LHC 01/18/16: s/p overlapping DESx2 to RCA, 10% ost-prox Cx,10% mLAD.   Depression    Diabetes mellitus    Dyslipidemia    GERD (gastroesophageal reflux disease)    Heart attack ([place])    Hypertension    Hypertensive heart disease    NSTEMI (non-ST elevated myocardial infarction) ([place]) 12/2015   Pneumonia     Current Outpatient Medications on File Prior to Visit  Medication Sig Dispense Refill   albuterol (PROAIR HFA) 108 (90 Base) MCG/ACT  inhaler Inhale 2 puffs into the lungs every 4 (four) hours as needed for wheezing or shortness of breath. 18 g 5   albuterol (PROVENTIL) (2.5 MG/3ML) 0.083% nebulizer solution Take 2.5 mg by nebulization every 4 (four) hours as needed for wheezing or shortness of breath.      ALPRAZolam (XANAX) 0.5 MG tablet Take 1 tablet (0.5 mg total) by mouth 2 (two) times daily as needed for anxiety. 30 tablet 0   aspirin EC 81 MG tablet Take 81 mg by mouth daily.      atorvastatin (LIPITOR) 40 MG tablet Take 40 mg by mouth at bedtime.     Biotin 5000 MCG CAPS Take 1 capsule by mouth daily.     budesonide (PULMICORT) 0.5 MG/2ML nebulizer solution Take 2 mLs (0.5 mg total) by nebulization in the morning and at bedtime. 60 mL 11   chlorpheniramine (CHLOR-TRIMETON) 4 MG tablet Take 4 mg by mouth 2 (two) times daily as needed for allergies or rhinitis.      chlorthalidone (HYGROTON) 25 MG tablet Take 1 tablet (25 mg total) by mouth daily. 30 tablet 11   dextromethorphan (DELSYM) 30 MG/5ML liquid Take 60 mg by mouth daily as needed for cough. 2 tsp every 12 hours as needed w/ flutter      diltiazem (CARTIA XT) 240 MG 24 hr capsule Take 1 capsule (240 mg total) by mouth daily. 90 capsule 3   divalproex (DEPAKOTE ER) 500 MG 24 hr tablet Take 2 tablets (1,000 mg total) by mouth at bedtime. (Patient taking differently: Take 500 mg by mouth daily. ) 90 tablet 4   esomeprazole (NEXIUM) 40 MG capsule Take 40 mg by mouth daily at 12 noon.     formoterol (PERFOROMIST) 20 MCG/2ML nebulizer solution Take 2 mLs (20 mcg total) by nebulization 2 (two) times daily. 60 mL 11   hydrALAZINE (APRESOLINE) 50 MG tablet Take 1 tablet (50 mg total) by mouth 3 (three) times daily. 270 tablet 3   latanoprost (XALATAN) 0.005 % ophthalmic solution      LUMIGAN 0.01 % SOLN 1 drop at bedtime.     memantine (NAMENDA) 10 MG tablet Take 1 tablet (10 mg total) by mouth 2 (two) times daily. 60 tablet 11   metFORMIN (GLUCOPHAGE) 500  MG tablet Take 500 mg by mouth 2 (two) times daily with a meal.     montelukast (SINGULAIR) 10 MG tablet TAKE 1 TABLET BY MOUTH AT BEDTIME 30 tablet 5   Multiple Vitamin (MULTIVITAMIN WITH MINERALS) TABS tablet Take 1 tablet by mouth daily.     nebivolol (BYSTOLIC) 10 MG tablet Take 1 tablet (10 mg total) by mouth  daily. 34 tablet 11   nitroGLYCERIN (NITROSTAT) 0.4 MG SL tablet DISSOLVE ONE TABLET UNDER THE TONGUE EVERY 5 MINUTES AS NEEDED FOR CHEST PAIN.  DO NOT EXCEED A TOTAL OF 3 DOSES IN 15 MINUTES 25 tablet 0   potassium chloride SA (K-DUR,KLOR-CON) 20 MEQ tablet Take 20 mEq by mouth daily.      Respiratory Therapy Supplies (FLUTTER) DEVI by Does not apply route.     torsemide (DEMADEX) 20 MG tablet Take 20 mg by mouth daily.      traMADol (ULTRAM) 50 MG tablet Take 50 mg by mouth every 4 (four) hours as needed.     No current facility-administered medications on file prior to visit.    No Known Allergies  Vitals obtained today: BP: 144/76 HR: 57 SPO2: 97%  Assessment/Plan:  1. Hypertension -  The patient's BP remains elevated above goal (<130/80) today in clinic. However home readings are at goal per patient report. Will continue Chlorthalidone 25 mg daily. Hydralazine 50 mg two times daily. Nebivolol 10 mg PO daily. Diltiazem 240 mg PO daily. Toresmide 20 mg daily Patient may continue hydralazine BID since she has not been able to remember to take TID. Will follow up with patient in 1 month. I have asked her to bring her blood pressure cuff and her readings with her to next visit.   Thank you,  Ramond Dial, Pharm.D, BCPS, CPP [facility]  [address] [place],  [address]  Phone: [phone]; Fax: [phone]

## 2020-03-30 ENCOUNTER — Telehealth: Payer: Self-pay | Admitting: Internal Medicine

## 2020-03-30 DIAGNOSIS — J410 Simple chronic bronchitis: Secondary | ICD-10-CM

## 2020-03-30 MED ORDER — FORMOTEROL FUMARATE 20 MCG/2ML IN NEBU: 20.0000 ug | INHALATION_SOLUTION | Freq: Two times a day (BID) | RESPIRATORY_TRACT | 11 refills | Status: DC

## 2020-03-30 NOTE — Telephone Encounter (Signed)
Spoke with the pt and notified I have refilled her rx  Nothing further needed

## 2020-04-21 ENCOUNTER — Ambulatory Visit (INDEPENDENT_AMBULATORY_CARE_PROVIDER_SITE_OTHER): Payer: Medicare Other | Admitting: Pharmacist

## 2020-04-21 ENCOUNTER — Other Ambulatory Visit: Payer: Self-pay

## 2020-04-21 VITALS — BP 130/64

## 2020-04-21 DIAGNOSIS — I10 Essential (primary) hypertension: Secondary | ICD-10-CM

## 2020-04-21 NOTE — Progress Notes (Signed)
Patient ID: [name]                 DOB: October 27, 1950                      MRN: [id number]     HPI: [name] is a 69 y.o. female referred by Dr. Marlou Porch to HTN clinic. PMH is significant for HTN, HLD, CHF (EF 60-65% consistently, last 2019), CAD, NSTEMI, acute PE, stage III CKD.  At last visit, patient's blood pressure was above goal. However, she reported that it was in the 120's/70's at home. She had reduced her hydralazine to 50mg  twice daily from three times a day because she kept forgetting the middle dose. No medication changes were made.  Patient presents today for follow up. She reports that she is feeling down. She just doesn't "feel like her self." Feels like she lost her purpose since she was forced to retire. Denies any thoughts of hurting herself or others.  Doesn't feel like walking. Hasn't walked in several weeks. She denies any dizziness, lightheadedness, headache, blurred vision, SOB or swelling. She has had constipation alternating with diarrhea for years now per her report. Is compliant with her medications. Home BP mostly at goal. She did bring her home BP machine in which matched clinic reading. BP in clinic was 130/60.  Current HTN meds: Chlorthalidone 25 mg daily. Hydralazine 50 mg two times daily. Nebivolol 10 mg PO daily. Diltiazem 240 mg PO daily. Toresmide 20 mg daily Previously tried: Irbesartan, valsartan, valsartan/HCTZ, (thought to have stopped ACE and ARB due to cough and asthma symptoms) metoprolol succinate, amlodipine previously on allergy list (unsure why, but maybe swelling) BP goal: <130/80  Family History: HTN (father), CAD (father, sister), CHF (sister)  Social History: Married with 3 children. Former smoker (quit 2005).  Diet: Adds salt to food; complains food is too bland with Mrs. DASH - Change: soft diet because of tooth pain, possible increase in salt from soup though trying to choose low sodium options.  Exercise: Takes daily walks up and  down her street 2x (hasnt done in several weeks) - 20 minutes of walking / day   Home BP readings: 137/65, 116/59, 121/69, 139/69, 121/62, 109/59, 124/64, 138/69, 125/65, 130/70, 120/70, 132/64   Wt Readings from Last 3 Encounters:  02/26/20 151 lb 6.4 oz (68.7 kg)  02/10/20 147 lb 12.8 oz (67 kg)  01/12/20 150 lb (68 kg)   BP Readings from Last 3 Encounters:  03/24/20 140/70  02/26/20 124/64  02/26/20 (!) 144/76   Pulse Readings from Last 3 Encounters:  03/24/20 62  02/26/20 71  02/26/20 (!) 57   BMP Latest Ref Rng & Units 02/26/2020 01/29/2020 01/15/2020  Glucose 65 - 99 mg/dL 150(H) 130(H) 115(H)  BUN 8 - 27 mg/dL 31(H) 23 23  Creatinine 0.57 - 1.00 mg/dL 1.26(H) 1.20(H) 1.20(H)  BUN/Creat Ratio 12 - 28 25 19 19   Sodium 134 - 144 mmol/L 141 143 140  Potassium 3.5 - 5.2 mmol/L 3.7 4.1 4.4  Chloride 96 - 106 mmol/L 96 99 101  CO2 20 - 29 mmol/L 29 29 23   Calcium 8.7 - 10.3 mg/dL 10.0 9.9 9.6   Renal function: CrCl cannot be calculated (Patient's most recent lab result is older than the maximum 21 days allowed.).  Past Medical History:  Diagnosis Date   Anxiety    Asthma    Bronchitis    Chronic diastolic CHF (congestive heart failure) ([place])  Chronic diastolic CHF (congestive heart failure) ([place]) 07/17/2016   Coronary artery disease    a. NSTEMI 12/2015 - LHC 01/18/16: s/p overlapping DESx2 to RCA, 10% ost-prox Cx,10% mLAD.   Depression    Diabetes mellitus    Dyslipidemia    GERD (gastroesophageal reflux disease)    Heart attack ([place])    Hypertension    Hypertensive heart disease    NSTEMI (non-ST elevated myocardial infarction) ([place]) 12/2015   Pneumonia     Current Outpatient Medications on File Prior to Visit  Medication Sig Dispense Refill   albuterol (PROAIR HFA) 108 (90 Base) MCG/ACT inhaler Inhale 2 puffs into the lungs every 4 (four) hours as needed for wheezing or shortness of breath. 18 g 5   albuterol (PROVENTIL) (2.5 MG/3ML) 0.083%  nebulizer solution Take 2.5 mg by nebulization every 4 (four) hours as needed for wheezing or shortness of breath.      ALPRAZolam (XANAX) 0.5 MG tablet Take 1 tablet (0.5 mg total) by mouth 2 (two) times daily as needed for anxiety. 30 tablet 0   aspirin EC 81 MG tablet Take 81 mg by mouth daily.      atorvastatin (LIPITOR) 40 MG tablet Take 40 mg by mouth at bedtime.     Biotin 5000 MCG CAPS Take 1 capsule by mouth daily.     budesonide (PULMICORT) 0.5 MG/2ML nebulizer solution Take 2 mLs (0.5 mg total) by nebulization in the morning and at bedtime. 60 mL 11   chlorpheniramine (CHLOR-TRIMETON) 4 MG tablet Take 4 mg by mouth 2 (two) times daily as needed for allergies or rhinitis.      chlorthalidone (HYGROTON) 25 MG tablet Take 1 tablet (25 mg total) by mouth daily. 30 tablet 11   dextromethorphan (DELSYM) 30 MG/5ML liquid Take 60 mg by mouth daily as needed for cough. 2 tsp every 12 hours as needed w/ flutter      diltiazem (CARTIA XT) 240 MG 24 hr capsule Take 1 capsule (240 mg total) by mouth daily. 90 capsule 3   divalproex (DEPAKOTE ER) 500 MG 24 hr tablet Take 2 tablets (1,000 mg total) by mouth at bedtime. (Patient taking differently: Take 500 mg by mouth daily. ) 90 tablet 4   esomeprazole (NEXIUM) 40 MG capsule Take 40 mg by mouth daily at 12 noon.     formoterol (PERFOROMIST) 20 MCG/2ML nebulizer solution Take 2 mLs (20 mcg total) by nebulization 2 (two) times daily. 120 mL 11   hydrALAZINE (APRESOLINE) 50 MG tablet Take 1 tablet (50 mg total) by mouth 2 (two) times daily. 270 tablet 3   latanoprost (XALATAN) 0.005 % ophthalmic solution      LUMIGAN 0.01 % SOLN 1 drop at bedtime.     memantine (NAMENDA) 10 MG tablet Take 1 tablet (10 mg total) by mouth 2 (two) times daily. 60 tablet 11   metFORMIN (GLUCOPHAGE) 500 MG tablet Take 500 mg by mouth 2 (two) times daily with a meal.     montelukast (SINGULAIR) 10 MG tablet TAKE 1 TABLET BY MOUTH AT BEDTIME 30 tablet 5    Multiple Vitamin (MULTIVITAMIN WITH MINERALS) TABS tablet Take 1 tablet by mouth daily.     nebivolol (BYSTOLIC) 10 MG tablet Take 1 tablet (10 mg total) by mouth daily. 34 tablet 11   nitroGLYCERIN (NITROSTAT) 0.4 MG SL tablet DISSOLVE ONE TABLET UNDER THE TONGUE EVERY 5 MINUTES AS NEEDED FOR CHEST PAIN.  DO NOT EXCEED A TOTAL OF 3 DOSES IN 15 MINUTES 25  tablet 0   potassium chloride SA (K-DUR,KLOR-CON) 20 MEQ tablet Take 20 mEq by mouth daily.      Respiratory Therapy Supplies (FLUTTER) DEVI by Does not apply route.     torsemide (DEMADEX) 20 MG tablet Take 20 mg by mouth daily.      traMADol (ULTRAM) 50 MG tablet Take 50 mg by mouth every 4 (four) hours as needed.     No current facility-administered medications on file prior to visit.    No Known Allergies   Assessment/Plan:  1. Hypertension - The patient's BP is at goal of (<130/80) today in clinic. The majority of her home readings are at goal too. Continue chlorthalidone 25 mg daily. Hydralazine 50 mg two times daily. Nebivolol 10 mg PO daily. Diltiazem 240 mg PO daily. Toresmide 20 mg daily. Discussed with patient talking to her PCP about a referral to a counselor for someone to talk to. Also spent time just listening to the patient. We talked about volunteering somewhere. I also recommended she force herself to go for a walk, as this is something that she really use to enjoy. Once she gets out there, I feel she will start to feel a little better. Exercise can be a great outlet for depression.  As far as blood pressure goes, patient is to follow up as needed. Phone number given for patient to reach out if she has questions or concerns.   Thank you,  Ramond Dial, Pharm.D, BCPS, CPP [place]  [id number], [place], [place] [address]  Phone: [phone]; Fax: [phone]

## 2020-04-21 NOTE — Patient Instructions (Addendum)
It was pleasure to see you today!  Continue Chlorthalidone 25 mg daily. Hydralazine 50 mg two times daily. Nebivolol 10 mg PO daily. Diltiazem 240 mg PO daily. Toresmide 20 mg daily  Start walking again. I think this will help you feel better  Talk to your PCP about referral to phycologist or counselor [facility]  Call me if you need anything [phone]

## 2020-04-30 ENCOUNTER — Emergency Department (HOSPITAL_COMMUNITY): Payer: Medicare Other

## 2020-04-30 ENCOUNTER — Emergency Department (HOSPITAL_COMMUNITY)
Admission: EM | Admit: 2020-04-30 | Discharge: 2020-04-30 | Disposition: A | Discharge status: Home / Self Care | Payer: Medicare Other | Attending: Emergency Medicine | Admitting: Emergency Medicine

## 2020-04-30 ENCOUNTER — Other Ambulatory Visit: Payer: Self-pay

## 2020-04-30 DIAGNOSIS — I5032 Chronic diastolic (congestive) heart failure: Secondary | ICD-10-CM | POA: Diagnosis not present

## 2020-04-30 DIAGNOSIS — N183 Chronic kidney disease, stage 3 unspecified: Secondary | ICD-10-CM | POA: Insufficient documentation

## 2020-04-30 DIAGNOSIS — R079 Chest pain, unspecified: Secondary | ICD-10-CM | POA: Diagnosis present

## 2020-04-30 DIAGNOSIS — Z955 Presence of coronary angioplasty implant and graft: Secondary | ICD-10-CM | POA: Diagnosis not present

## 2020-04-30 DIAGNOSIS — Z7982 Long term (current) use of aspirin: Secondary | ICD-10-CM | POA: Insufficient documentation

## 2020-04-30 DIAGNOSIS — Z79899 Other long term (current) drug therapy: Secondary | ICD-10-CM | POA: Insufficient documentation

## 2020-04-30 DIAGNOSIS — Z7984 Long term (current) use of oral hypoglycemic drugs: Secondary | ICD-10-CM | POA: Insufficient documentation

## 2020-04-30 DIAGNOSIS — J45901 Unspecified asthma with (acute) exacerbation: Secondary | ICD-10-CM | POA: Diagnosis not present

## 2020-04-30 DIAGNOSIS — I2510 Atherosclerotic heart disease of native coronary artery without angina pectoris: Secondary | ICD-10-CM | POA: Diagnosis not present

## 2020-04-30 DIAGNOSIS — Z86711 Personal history of pulmonary embolism: Secondary | ICD-10-CM | POA: Insufficient documentation

## 2020-04-30 DIAGNOSIS — Z87891 Personal history of nicotine dependence: Secondary | ICD-10-CM | POA: Diagnosis not present

## 2020-04-30 DIAGNOSIS — E1122 Type 2 diabetes mellitus with diabetic chronic kidney disease: Secondary | ICD-10-CM | POA: Diagnosis not present

## 2020-04-30 DIAGNOSIS — I130 Hypertensive heart and chronic kidney disease with heart failure and stage 1 through stage 4 chronic kidney disease, or unspecified chronic kidney disease: Secondary | ICD-10-CM | POA: Diagnosis not present

## 2020-04-30 LAB — CBC
HCT: 35.1 % — ABNORMAL LOW (ref 36.0–46.0)
Hemoglobin: 11.5 g/dL — ABNORMAL LOW (ref 12.0–15.0)
MCH: 30.8 pg (ref 26.0–34.0)
MCHC: 32.8 g/dL (ref 30.0–36.0)
MCV: 94.1 fL (ref 80.0–100.0)
Platelets: 280 10*3/uL (ref 150–400)
RBC: 3.73 MIL/uL — ABNORMAL LOW (ref 3.87–5.11)
RDW: 13.6 % (ref 11.5–15.5)
WBC: 8.9 10*3/uL (ref 4.0–10.5)
nRBC: 0 % (ref 0.0–0.2)

## 2020-04-30 LAB — BASIC METABOLIC PANEL
Anion gap: 14 (ref 5–15)
BUN: 27 mg/dL — ABNORMAL HIGH (ref 8–23)
CO2: 29 mmol/L (ref 22–32)
Calcium: 9.5 mg/dL (ref 8.9–10.3)
Chloride: 97 mmol/L — ABNORMAL LOW (ref 98–111)
Creatinine, Ser: 1.28 mg/dL — ABNORMAL HIGH (ref 0.44–1.00)
GFR, Estimated: 45 mL/min — ABNORMAL LOW (ref >60)
Glucose, Bld: 96 mg/dL (ref 70–99)
Potassium: 3.1 mmol/L — ABNORMAL LOW (ref 3.5–5.1)
Sodium: 140 mmol/L (ref 135–145)

## 2020-04-30 LAB — TROPONIN I (HIGH SENSITIVITY)
Troponin I (High Sensitivity): 5 ng/L (ref <18)
Troponin I (High Sensitivity): 5 ng/L (ref <18)

## 2020-04-30 MED ORDER — FAMOTIDINE 20 MG PO TABS: 20.0000 mg | ORAL_TABLET | Freq: Every day | ORAL | 0 refills | Status: DC

## 2020-04-30 MED ORDER — LIDOCAINE VISCOUS HCL 2 % MT SOLN
15.0000 mL | Freq: Once | OROMUCOSAL | Status: AC
  Administered 2020-04-30: 15 mL via ORAL
  Filled 2020-04-30: qty 15

## 2020-04-30 MED ORDER — ALUM & MAG HYDROXIDE-SIMETH 200-200-20 MG/5ML PO SUSP
30.0000 mL | Freq: Once | ORAL | Status: AC
  Administered 2020-04-30: 30 mL via ORAL
  Filled 2020-04-30: qty 30

## 2020-04-30 MED ORDER — SUCRALFATE 1 G PO TABS: 1.0000 g | ORAL_TABLET | Freq: Three times a day (TID) | ORAL | 0 refills | Status: DC

## 2020-04-30 NOTE — ED Triage Notes (Addendum)
Pt presents to ED POV. Pt c/o CP x3d that has worsened today. Pain is described as pressure, 10/10, hx MI 4y ago. Ems given 324 ASA, nitro x1. Nitro did not relieve pain  EMS   20G  113/78 HR - 90 rr - 22 99% RA

## 2020-04-30 NOTE — ED Provider Notes (Signed)
[facility] EMERGENCY DEPARTMENT Provider Note   CSN: [id number] Arrival date & time: 04/30/20  0155     History Chief Complaint  Patient presents with  . Chest Pain    [name] is a 69 y.o. female.  The history is provided by the patient and medical records.  Chest Pain   69 y.o. F with hx of anxiety, CHF, CAD s/p DES to RCA, depression, DM, HLP. HTN, presenting to the ED with chest pain.  States this has been constant pain for 3 days, never lets up.  States it feels like someone is pushing on her center chest, but also has a burning sensation and feels like a "lump in her throat".  Denies any difficulty swallowing, no drooling.  States initially she felt like it was GERD so she increased her home nexium but has not had any relief with this.  Otherwise she has been well.  No vomiting, cough, fever, SOB, sweating, dizziness, or syncope.  She is followed by cardiology, Dr. Marlou Porch.  Past Medical History:  Diagnosis Date  . Anxiety   . Asthma   . Bronchitis   . Chronic diastolic CHF (congestive heart failure) ([place])   . Chronic diastolic CHF (congestive heart failure) ([place]) 07/17/2016  . Coronary artery disease    a. NSTEMI 12/2015 - LHC 01/18/16: s/p overlapping DESx2 to RCA, 10% ost-prox Cx,10% mLAD.  [place] Kitchen Depression   . Diabetes mellitus   . Dyslipidemia   . GERD (gastroesophageal reflux disease)   . Heart attack ([place])   . Hypertension   . Hypertensive heart disease   . NSTEMI (non-ST elevated myocardial infarction) ([place]) 12/2015  . Pneumonia     Patient Active Problem List   Diagnosis Date Noted  . Wheezing 09/13/2019  . Memory loss 03/20/2019  . Severe persistent asthma without complication [id number]  . DM2 (diabetes mellitus, type 2) ([place]) 03/15/2018  . CKD (chronic kidney disease), stage III ([place]) 03/15/2018  . Dyspnea 08/12/2017  . Macrocytic anemia 08/12/2017  . Therapeutic drug monitoring 05/17/2017  . Lung nodule 08/01/2016  . Influenza  B 07/18/2016  . Chronic diastolic CHF (congestive heart failure) ([place]) 07/17/2016  . Acute renal failure superimposed on stage 3 chronic kidney disease ([place]) 07/17/2016  . Right leg swelling 02/27/2016  . Acute respiratory failure with hypoxia ([place])   . Uncontrolled type 2 diabetes mellitus with complication ([place])   . Acute pulmonary embolism ([place])   . HLD (hyperlipidemia)   . Seizures ([place]) 01/27/2016  . Numbness 01/27/2016  . CAD in native artery 01/20/2016  . Hypertensive heart disease 01/20/2016  . NSTEMI (non-ST elevated myocardial infarction) ([place]) 01/18/2016  . Acute bronchitis with asthma with acute exacerbation 01/16/2016  . Hypokalemia 01/16/2016  . Hypomagnesemia 01/16/2016  . Acute respiratory failure ([place]) 01/16/2016  . Dyspnea on exertion   . Asthma exacerbation 07/21/2015  . Upper airway cough syndrome with VCD 08/28/2014  . Diabetes mellitus type 2, controlled ([place]) 08/06/2014  . Hyperlipidemia 08/06/2014  . Allergic rhinitis 07/22/2008  . 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 DYSPNEA 07/22/2008  . Diabetes mellitus type 2, uncontrolled ([place]) 07/21/2008  . DIABETIC PERIPHERAL NEUROPATHY 07/21/2008  . Depression with anxiety 07/21/2008  . Essential hypertension 07/21/2008  . Mild persistent chronic asthma without complication 99991111  . G E R D 07/21/2008  . PULMONARY EMBOLISM, HX OF 07/21/2008    Past Surgical History:  Procedure Laterality Date  . ABDOMINAL HYSTERECTOMY    . CARDIAC CATHETERIZATION  1994   minimal LAD dz, no other dz, EF normal  . CARDIAC CATHETERIZATION N/A 01/18/2016   Procedure: Left Heart Cath and Coronary Angiography;  Surgeon: Kathleene Hazel, MD;  [facility] IN[facility]AS[facility] CV LAB;  Service: Cardiovascular;  Laterality: N/A;  . CARDIAC CATHETERIZATION N/A 01/18/2016   Procedure: Coronary Stent Intervention;  Surgeon: Kathleene Hazel, MD;  Location: [facility] INVASIVE CV LAB;  Service: Cardiovascular;  Laterality: N/A;  .  COLONOSCOPY WITH PROPOFOL N/A 04/09/2014   Procedure: COLONOSCOPY WITH PROPOFOL;  Surgeon: Florencia Reasons, MD;  Location: [facility] ENDOSCOPY;  Service: Endoscopy;  Laterality: N/A;  . CORONARY STENT PLACEMENT  01/19/2016   STENT SYNERGY DES 3.82N05 drug eluting stent was successfully placed, and overlaps the 2.5 x 38 mm Synergy stent placed distally.  . ESOPHAGOGASTRODUODENOSCOPY (EGD) WITH PROPOFOL N/A 04/09/2014   Procedure: ESOPHAGOGASTRODUODENOSCOPY (EGD) WITH PROPOFOL;  Surgeon: Florencia Reasons, MD;  Location: [facility] ENDOSCOPY;  Service: Endoscopy;  Laterality: N/A;  . HEMORRHOID SURGERY    . HERNIA REPAIR    . KNEE ARTHROSCOPY       OB History   No obstetric history on file.     Family History  Problem Relation Age of [place]set  . Allergies Mother   . Asthma Mother   . CAD Father 29  . Diabetes Father   . Hypertension Father   . Congestive Heart Failure Sister        died in her 74s  . CAD Sister 68    Social History   Tobacco Use  . Smoking status: Former Smoker    Packs/day: 1.00    Years: 30.00    Pack years: 30.00    Types: Cigarettes    Quit date: 01/09/2004    Years since quitting: 16.3  . Smokeless tobacco: Never Used  Vaping Use  . Vaping Use: Never used  Substance Use Topics  . Alcohol use: No    Alcohol/week: 0.0 standard drinks  . Drug use: No    Home Medications Prior to Admission medications   Medication Sig Start Date End Date Taking? Authorizing Provider  albuterol (PROAIR HFA) 108 (90 Base) MCG/ACT inhaler Inhale 2 puffs into the lungs every 4 (four) hours as needed for wheezing or shortness of breath. 04/08/19   Nyoka Cowden, MD  albuterol (PROVENTIL) (2.5 MG/3ML) 0.083% nebulizer solution Take 2.5 mg by nebulization every 4 (four) hours as needed for wheezing or shortness of breath.     [provider]  ALPRAZolam Prudy Feeler) 0.5 MG tablet Take 1 tablet (0.5 mg total) by mouth 2 (two) times daily as needed for anxiety. 07/31/15   Dorothea Ogle, MD   aspirin EC 81 MG tablet Take 81 mg by mouth daily.     [provider]  atorvastatin (LIPITOR) 40 MG tablet Take 40 mg by mouth at bedtime.    [provider]  Biotin 5000 MCG CAPS Take 1 capsule by mouth daily.    [provider]  budesonide (PULMICORT) 0.5 MG/2ML nebulizer solution Take 2 mLs (0.5 mg total) by nebulization in the morning and at bedtime. 01/13/20   Nyoka Cowden, MD  chlorpheniramine (CHLOR-TRIMETON) 4 MG tablet Take 4 mg by mouth 2 (two) times daily as needed for allergies or rhinitis.     [provider]  chlorthalidone (HYGROTON) 25 MG tablet Take 1 tablet (25 mg total) by mouth daily. 02/10/20   Jake Bathe, MD  dextromethorphan (DELSYM) 30 MG/5ML liquid Take 60 mg by mouth  daily as needed for cough. 2 tsp every 12 hours as needed w/ flutter     [provider]  diltiazem (CARTIA XT) 240 MG 24 hr capsule Take 1 capsule (240 mg total) by mouth daily. 02/17/20   Jerline Pain, MD  divalproex (DEPAKOTE ER) 500 MG 24 hr tablet Take 2 tablets (1,000 mg total) by mouth at bedtime. Patient taking differently: Take 500 mg by mouth daily.  12/08/19   Marcial Pacas, MD  esomeprazole (NEXIUM) 40 MG capsule Take 40 mg by mouth daily at 12 noon.    [provider]  formoterol (PERFOROMIST) 20 MCG/2ML nebulizer solution Take 2 mLs (20 mcg total) by nebulization 2 (two) times daily. 03/30/20   Tanda Rockers, MD  hydrALAZINE (APRESOLINE) 50 MG tablet Take 1 tablet (50 mg total) by mouth 2 (two) times daily. 03/24/20   Jerline Pain, MD  latanoprost (XALATAN) 0.005 % ophthalmic solution  02/05/20   [provider]  LUMIGAN 0.01 % SOLN 1 drop at bedtime. 02/03/20   [provider]  memantine (NAMENDA) 10 MG tablet Take 1 tablet (10 mg total) by mouth 2 (two) times daily. 07/08/19   Suzzanne Cloud, NP  metFORMIN (GLUCOPHAGE) 500 MG tablet Take 500 mg by mouth 2 (two) times daily with a meal.    [provider]   montelukast (SINGULAIR) 10 MG tablet TAKE 1 TABLET BY MOUTH AT BEDTIME 02/13/20   Lauraine Rinne, NP  Multiple Vitamin (MULTIVITAMIN WITH MINERALS) TABS tablet Take 1 tablet by mouth daily.    [provider]  nebivolol (BYSTOLIC) 10 MG tablet Take 1 tablet (10 mg total) by mouth daily. 04/10/19   Jerline Pain, MD  nitroGLYCERIN (NITROSTAT) 0.4 MG SL tablet DISSOLVE ONE TABLET UNDER THE TONGUE EVERY 5 MINUTES AS NEEDED FOR CHEST PAIN.  DO NOT EXCEED A TOTAL OF 3 DOSES IN 15 MINUTES 11/19/19   Burtis Junes, NP  potassium chloride SA (K-DUR,KLOR-CON) 20 MEQ tablet Take 20 mEq by mouth daily.     [provider]  Respiratory Therapy Supplies (FLUTTER) DEVI by Does not apply route.    [provider]  torsemide (DEMADEX) 20 MG tablet Take 20 mg by mouth daily.  10/25/16   [provider]  traMADol (ULTRAM) 50 MG tablet Take 50 mg by mouth every 4 (four) hours as needed. 02/13/20   [provider]    Allergies    Patient has no known allergies.  Review of Systems   Review of Systems  Cardiovascular: Positive for chest pain.  All other systems reviewed and are negative.   Physical Exam Updated Vital Signs BP 138/64   Pulse 69   Temp 98.6 F (37 C) (Oral)   Resp 13   LMP  (LMP Unknown)   SpO2 99%   Physical Exam Vitals and nursing note reviewed.  Constitutional:      Appearance: She is well-developed and well-nourished.  HENT:     Head: Normocephalic and atraumatic.     Mouth/Throat:     Mouth: Oropharynx is clear and moist.  Eyes:     Extraocular Movements: EOM normal.     Conjunctiva/sclera: Conjunctivae normal.     Pupils: Pupils are equal, round, and reactive to light.  Cardiovascular:     Rate and Rhythm: Normal rate and regular rhythm.     Heart sounds: Normal heart sounds.  Pulmonary:     Effort: Pulmonary effort is normal.     Breath  sounds: Normal breath sounds. No decreased breath sounds or wheezing.  Abdominal:      General: Bowel sounds are normal.     Palpations: Abdomen is soft.  Musculoskeletal:        General: Normal range of motion.     Cervical back: Normal range of motion.  Skin:    General: Skin is warm and dry.  Neurological:     Mental Status: She is alert and oriented to person, place, and time.  Psychiatric:        Mood and Affect: Mood and affect normal.     ED Results / Procedures / Treatments   Labs (all labs ordered are listed, but only abnormal results are displayed) Labs Reviewed  BASIC METABOLIC PANEL - Abnormal; Notable for the following components:      Result Value   Potassium 3.1 (*)    Chloride 97 (*)    BUN 27 (*)    Creatinine, Ser 1.28 (*)    GFR, Estimated 45 (*)    All other components within normal limits  CBC - Abnormal; Notable for the following components:   RBC 3.73 (*)    Hemoglobin 11.5 (*)    HCT 35.1 (*)    All other components within normal limits  TROPONIN I (HIGH SENSITIVITY)  TROPONIN I (HIGH SENSITIVITY)    EKG None  Radiology DG Chest 2 View  Result Date: 04/30/2020 CLINICAL DATA:  Chest pain EXAM: CHEST - 2 VIEW COMPARISON:  None. FINDINGS: Lungs are well expanded, symmetric, and clear. No pneumothorax or pleural effusion. Cardiac size within normal limits. Long segment right coronary artery stenting has been performed. Pulmonary vascularity is normal. Osseous structures are age-appropriate. No acute bone abnormality. IMPRESSION: No active cardiopulmonary disease. Electronically Signed   By: Helyn Numbers MD   On: 04/30/2020 02:38    Procedures Procedures (including critical care time)  Medications Ordered in ED Medications  alum & mag hydroxide-simeth (MAALOX/MYLANTA) 200-200-20 MG/5ML suspension 30 mL (30 mLs Oral Given 04/30/20 0553)    And  lidocaine (XYLOCAINE) 2 % viscous mouth solution 15 mL (15 mLs Oral Given 04/30/20 5883)    ED Course  I have reviewed the triage vital signs and the nursing notes.  Pertinent labs &  imaging results that were available during my care of the patient were reviewed by me and considered in my medical decision making (see chart for details).    MDM Rules/Calculators/A&P  69 year old female presenting to the ED with chest pain.  Has been constant for 3 days, feels like someone is pushing on center chest but also with burning sensation and "lump" of the throat.  No cough, fever, or upper respiratory symptoms.  She is afebrile and nontoxic.  No reproducible pain with palpation.  EKG is nonischemic.  Labs are reassuring including troponin x2.  Chest x-ray is clear.  Given symptoms, this may be GI related which she also thought at home so increased her nexium without relief. Given GI cocktail here but reports no improvement.  Will try to add pepcid and carafate for a few days at home to see if this helps.  She will call Dr. Anne Fu today to arrange some follow-up.  Return here for any new/acute changes.  Shared visit with attending physician, Dr. Blinda Leatherwood, who evaluated patient and agrees with plan of care.  Final Clinical Impression(s) / ED Diagnoses Final diagnoses:  Chest pain in adult    Rx / DC Orders ED Discharge Orders  Ordered    famotidine (PEPCID) 20 MG tablet  Daily        04/30/20 0621    sucralfate (CARAFATE) 1 g tablet  3 times daily with meals & bedtime        04/30/20 0621           Larene Pickett, PA-C 04/30/20 [id number]    Orpah Greek, MD 04/30/20 [phone]

## 2020-04-30 NOTE — Discharge Instructions (Signed)
Cardiac work-up today was normal. We have sent additional medication to your pharmacy to see if this will help with symptoms. Follow-up with Dr. Anne Fu. Return here for any new/acute changes.

## 2020-05-11 ENCOUNTER — Encounter: Payer: Self-pay | Admitting: *Deleted

## 2020-05-11 ENCOUNTER — Encounter: Payer: Self-pay | Admitting: Cardiology

## 2020-05-11 ENCOUNTER — Other Ambulatory Visit: Payer: Self-pay

## 2020-05-11 ENCOUNTER — Ambulatory Visit (INDEPENDENT_AMBULATORY_CARE_PROVIDER_SITE_OTHER): Payer: Medicare Other | Admitting: Cardiology

## 2020-05-11 VITALS — BP 120/50 | HR 61 | Ht 61.0 in | Wt 145.0 lb

## 2020-05-11 DIAGNOSIS — R079 Chest pain, unspecified: Secondary | ICD-10-CM | POA: Diagnosis not present

## 2020-05-11 DIAGNOSIS — I2510 Atherosclerotic heart disease of native coronary artery without angina pectoris: Secondary | ICD-10-CM

## 2020-05-11 DIAGNOSIS — E7800 Pure hypercholesterolemia, unspecified: Secondary | ICD-10-CM | POA: Diagnosis not present

## 2020-05-11 NOTE — Patient Instructions (Signed)
Medication Instructions:  The current medical regimen is effective;  continue present plan and medications.  *If you need a refill on your cardiac medications before your next appointment, please call your pharmacy*  Testing/Procedures: Your physician has requested that you have a lexiscan myoview. For further information please visit [email or web address]. Please follow instruction sheet, as given.  Follow-Up: At [facility], you and your health needs are our priority.  As part of our continuing mission to provide you with exceptional heart care, we have created designated Provider Care Teams.  These Care Teams include your primary Cardiologist (physician) and Advanced Practice Providers (APPs -  Physician Assistants and Nurse Practitioners) who all work together to provide you with the care you need, when you need it.  We recommend signing up for the patient portal called "MyChart".  Sign up information is provided on this After Visit Summary.  MyChart is used to connect with patients for Virtual Visits (Telemedicine).  Patients are able to view lab/test results, encounter notes, upcoming appointments, etc.  Non-urgent messages can be sent to your provider as well.   To learn more about what you can do with MyChart, go to [email or web address].    Your next appointment:   6 month(s)  The format for your next appointment:   In Person  Provider:   Mark Skains, MD   Thank you for choosing [facility]!!     

## 2020-05-11 NOTE — Progress Notes (Signed)
Cardiology Office Note:    Date:  05/11/2020   ID:  [name], DOB 1950-12-14, MRN [id number]  PCP:  [name], MD  [facility] Cardiologist:  Candee Furbish, MD  [facility] Electrophysiologist:  None   Referring MD: [name], MD     History of Present Illness:    [name] is a 70 y.o. female here for the follow-up of hypertension with comorbidities of CAD prior non-STEMI, prior PE stage III chronic kidney disease.  She has been seen by the hypertension clinic, most recent note from 01/15/2020 reviewed.  Previously tried: Irbesartan, valsartan, valsartan/HCTZ, (thought to have stopped ACE and ARB due to cough and asthma symptoms) metoprolol succinate, amlodipine previously on allergy list (unsure why, but maybe swelling)  Blood pressure continues to still be high.  At hypertension visit she was asked to start chlorthalidone but she was hesitant.  She is also hesitant to retry ACE inhibitor's or ARBs because of the cough.  They did try to start chlorthalidone 12.5 mg a day.  She revisited with him and it was determined to continue with  chlorthalidone 12.5 Hydralazine 25 3 times a day Nebivolol 10 mg a day Diltiazem 240 mg a day Torsemide 20 mg a day  Plan can be to increase chlorthalidone to 25 if necessary.  Past Medical History:  Diagnosis Date  . Anxiety   . Asthma   . Bronchitis   . Chronic diastolic CHF (congestive heart failure) ([place])   . Chronic diastolic CHF (congestive heart failure) ([place]) 07/17/2016  . Coronary artery disease    a. NSTEMI 12/2015 - LHC 01/18/16: s/p overlapping DESx2 to RCA, 10% ost-prox Cx,10% mLAD.  Marland Kitchen Depression   . Diabetes mellitus   . Dyslipidemia   . GERD (gastroesophageal reflux disease)   . Heart attack ([place])   . Hypertension   . Hypertensive heart disease   . NSTEMI (non-ST elevated myocardial infarction) ([place]) 12/2015  . Pneumonia     Past Surgical History:  Procedure Laterality Date  . ABDOMINAL  HYSTERECTOMY    . CARDIAC CATHETERIZATION  1994   minimal LAD dz, no other dz, EF normal  . CARDIAC CATHETERIZATION N/A 01/18/2016   Procedure: Left Heart Cath and Coronary Angiography;  Surgeon: Burnell Blanks, MD;  Location: [facility];  Service: Cardiovascular;  Laterality: N/A;  . CARDIAC CATHETERIZATION N/A 01/18/2016   Procedure: Coronary Stent Intervention;  Surgeon: Burnell Blanks, MD;  Location: [facility];  Service: Cardiovascular;  Laterality: N/A;  . COLONOSCOPY WITH PROPOFOL N/A 04/09/2014   Procedure: COLONOSCOPY WITH PROPOFOL;  Surgeon: Cleotis Nipper, MD;  Location: [facility];  Service: Endoscopy;  Laterality: N/A;  . CORONARY STENT PLACEMENT  01/19/2016   STENT SYNERGY DES 9.67R91 drug eluting stent was successfully placed, and overlaps the 2.5 x 38 mm Synergy stent placed distally.  . ESOPHAGOGASTRODUODENOSCOPY (EGD) WITH PROPOFOL N/A 04/09/2014   Procedure: ESOPHAGOGASTRODUODENOSCOPY (EGD) WITH PROPOFOL;  Surgeon: Cleotis Nipper, MD;  Location: [facility];  Service: Endoscopy;  Laterality: N/A;  . HEMORRHOID SURGERY    . HERNIA REPAIR    . KNEE ARTHROSCOPY      Current Medications: Current Meds  Medication Sig  . albuterol (PROAIR HFA) 108 (90 Base) MCG/ACT inhaler Inhale 2 puffs into the lungs every 4 (four) hours as needed for wheezing or shortness of breath.  Marland Kitchen albuterol (PROVENTIL) (2.5 MG/3ML) 0.083% nebulizer solution Take 2.5 mg by nebulization every 4 (four) hours as needed for  wheezing or shortness of breath.   . ALPRAZolam (XANAX) 0.5 MG tablet Take 1 tablet (0.5 mg total) by mouth 2 (two) times daily as needed for anxiety.  Marland Kitchen aspirin EC 81 MG tablet Take 81 mg by mouth daily.   Marland Kitchen atorvastatin (LIPITOR) 40 MG tablet Take 40 mg by mouth at bedtime.  . Biotin 5000 MCG CAPS Take 1 capsule by mouth daily.  . budesonide (PULMICORT) 0.5 MG/2ML nebulizer solution Take 2 mLs (0.5 mg total) by nebulization in the morning and at  bedtime.  . chlorpheniramine (CHLOR-TRIMETON) 4 MG tablet Take 4 mg by mouth 2 (two) times daily as needed for allergies or rhinitis.   . chlorthalidone (HYGROTON) 25 MG tablet Take 1 tablet (25 mg total) by mouth daily.  Marland Kitchen dextromethorphan (DELSYM) 30 MG/5ML liquid Take 60 mg by mouth daily as needed for cough. 2 tsp every 12 hours as needed w/ flutter  . diltiazem (CARTIA XT) 240 MG 24 hr capsule Take 1 capsule (240 mg total) by mouth daily.  . divalproex (DEPAKOTE ER) 500 MG 24 hr tablet Take 2 tablets (1,000 mg total) by mouth at bedtime.  Marland Kitchen esomeprazole (NEXIUM) 40 MG capsule Take 40 mg by mouth daily at 12 noon.  . famotidine (PEPCID) 20 MG tablet Take 1 tablet (20 mg total) by mouth daily.  . formoterol (PERFOROMIST) 20 MCG/2ML nebulizer solution Take 2 mLs (20 mcg total) by nebulization 2 (two) times daily.  . hydrALAZINE (APRESOLINE) 50 MG tablet Take 1 tablet (50 mg total) by mouth 2 (two) times daily.  Marland Kitchen latanoprost (XALATAN) 0.005 % ophthalmic solution   . LUMIGAN 0.01 % SOLN 1 drop at bedtime.  . memantine (NAMENDA) 10 MG tablet Take 1 tablet (10 mg total) by mouth 2 (two) times daily.  . metFORMIN (GLUCOPHAGE) 500 MG tablet Take 500 mg by mouth 2 (two) times daily with a meal.  . montelukast (SINGULAIR) 10 MG tablet TAKE 1 TABLET BY MOUTH AT BEDTIME  . Multiple Vitamin (MULTIVITAMIN WITH MINERALS) TABS tablet Take 1 tablet by mouth daily.  . nebivolol (BYSTOLIC) 10 MG tablet Take 1 tablet (10 mg total) by mouth daily.  . nitroGLYCERIN (NITROSTAT) 0.4 MG SL tablet DISSOLVE ONE TABLET UNDER THE TONGUE EVERY 5 MINUTES AS NEEDED FOR CHEST PAIN.  DO NOT EXCEED A TOTAL OF 3 DOSES IN 15 MINUTES  . potassium chloride SA (K-DUR,KLOR-CON) 20 MEQ tablet Take 20 mEq by mouth daily.   Marland Kitchen Respiratory Therapy Supplies (FLUTTER) DEVI by Does not apply route.  . sucralfate (CARAFATE) 1 g tablet Take 1 tablet (1 g total) by mouth 4 (four) times daily -  with meals and at bedtime.  . torsemide  (DEMADEX) 20 MG tablet Take 20 mg by mouth daily.  . traMADol (ULTRAM) 50 MG tablet Take 50 mg by mouth every 4 (four) hours as needed.     Allergies:   Patient has no known allergies.   Social History   Socioeconomic History  . Marital status: Married    Spouse name: Not on file  . Number of children: 3  . Years of education: some college  . Highest education level: Not on file  Occupational History  . Occupation: Nutritional therapist  Tobacco Use  . Smoking status: Former Smoker    Packs/day: 1.00    Years: 30.00    Pack years: 30.00    Types: Cigarettes    Quit date: 01/09/2004    Years since quitting: 16.3  . Smokeless tobacco: Never Used  Vaping Use  . Vaping Use: Never used  Substance and Sexual Activity  . Alcohol use: No    Alcohol/week: 0.0 standard drinks  . Drug use: No  . Sexual activity: Not on file  Other Topics Concern  . Not on file  Social History Narrative   Pt lives with husband in [place].   Right-handed.   2 cups caffeine per day.   Social Determinants of Health   Financial Resource Strain: Not on file  Food Insecurity: Not on file  Transportation Needs: Not on file  Physical Activity: Not on file  Stress: Not on file  Social Connections: Not on file     Family History: The patient's family history includes Allergies in her mother; Asthma in her mother; CAD (age of onset: 63) in her father; CAD (age of onset: 64) in her sister; Congestive Heart Failure in her sister; Diabetes in her father; Hypertension in her father.  ROS:   Please see the history of present illness.     All other systems reviewed and are negative.  EKGs/Labs/Other Studies Reviewed:    The following studies were reviewed today:  Echo 08/13/2017-EF 70% Recent Labs: 04/30/2020: BUN 27; Creatinine, Ser 1.28; Hemoglobin 11.5; Platelets 280; Potassium 3.1; Sodium 140  Recent Lipid Panel    Component Value Date/Time   CHOL 196 07/18/2016 0142   TRIG 203 (H) 07/18/2016 0142    HDL 48 07/18/2016 0142   CHOLHDL 4.1 07/18/2016 0142   VLDL 41 (H) 07/18/2016 0142   LDLCALC 107 (H) 07/18/2016 0142     Risk Assessment/Calculations:       Physical Exam:    VS:  BP (!) 120/50 (BP Location: Left Arm, Patient Position: Sitting, Cuff Size: Normal)   Pulse 61   Ht 5\' 1"  (1.549 m)   Wt 145 lb (65.8 kg)   LMP  (LMP Unknown)   SpO2 98%   BMI 27.40 kg/m     Wt Readings from Last 3 Encounters:  05/11/20 145 lb (65.8 kg)  02/26/20 151 lb 6.4 oz (68.7 kg)  02/10/20 147 lb 12.8 oz (67 kg)     GEN:  Well nourished, well developed in no acute distress HEENT: Normal NECK: No JVD; No carotid bruits LYMPHATICS: No lymphadenopathy CARDIAC: RRR, no murmurs, rubs, gallops RESPIRATORY:  Clear to auscultation without rales, wheezing or rhonchi  ABDOMEN: Soft, non-tender, non-distended MUSCULOSKELETAL:  No edema; No deformity  SKIN: Warm and dry NEUROLOGIC:  Alert and oriented x 3 PSYCHIATRIC:  Normal affect   ASSESSMENT:    1. Coronary artery disease involving native heart without angina pectoris, unspecified vessel or lesion type   2. Pure hypercholesterolemia   3. Chest pain of uncertain etiology    PLAN:    In order of problems listed above:  Chest pain - May have been GI in etiology. Was seen in the emergency room on 04/30/2020. Overall she is doing better. I will go ahead and check a pharmacologic stress test since it has been about 5 years since coronary evaluation took place.  Risk and benefits have been explained she is willing to proceed.  Prior RCA stents.  Coronary artery disease -Myocardial infarction 2017 2 stents to RCA.  Aspirin, statin, Bystolic.  Aggressive secondary prevention.  No change in medical therapy.     Essential hypertension -Appreciate hypertension clinic assistance. Continue chlorthalidone 25 mg daily.Hydralazine 50 mg two times daily. Nebivolol 10 mg PO daily. Diltiazem 240 mg PO daily. Toresmide 20mg daily.  Non-ST  elevation myocardial  infarction -September 2017-cath RCA disease with DES x2  Asthma -Dr. Melvyn Novas, FEV1 68%  GERD - Okay to stop additional heartburn medication that she got from the emergency department.  History of seizures followed by neurology     Medication Adjustments/Labs and Tests Ordered: Current medicines are reviewed at length with the patient today.  Concerns regarding medicines are outlined above.  Orders Placed This Encounter  Procedures  . MYOCARDIAL PERFUSION IMAGING   No orders of the defined types were placed in this encounter.   Patient Instructions  Medication Instructions:  The current medical regimen is effective;  continue present plan and medications.  *If you need a refill on your cardiac medications before your next appointment, please call your pharmacy*  Testing/Procedures: Your physician has requested that you have a lexiscan myoview. For further information please visit [email or web address]. Please follow instruction sheet, as given.  Follow-Up: At [facility], you and your health needs are our priority.  As part of our continuing mission to provide you with exceptional heart care, we have created designated Provider Care Teams.  These Care Teams include your primary Cardiologist (physician) and Advanced Practice Providers (APPs -  Physician Assistants and Nurse Practitioners) who all work together to provide you with the care you need, when you need it.  We recommend signing up for the patient portal called "MyChart".  Sign up information is provided on this After Visit Summary.  MyChart is used to connect with patients for Virtual Visits (Telemedicine).  Patients are able to view lab/test results, encounter notes, upcoming appointments, etc.  Non-urgent messages can be sent to your provider as well.   To learn more about what you can do with MyChart, go to [email or web address].    Your next appointment:   6 month(s)  The format for your next  appointment:   In Person  Provider:   Candee Furbish, MD   Thank you for choosing [facility]!!         Signed, Candee Furbish, MD  05/11/2020 2:43 PM    [place]

## 2020-05-13 ENCOUNTER — Encounter (HOSPITAL_COMMUNITY): Payer: Self-pay

## 2020-05-13 ENCOUNTER — Telehealth (HOSPITAL_COMMUNITY): Payer: Self-pay

## 2020-05-18 ENCOUNTER — Encounter (HOSPITAL_COMMUNITY): Payer: Medicare Other

## 2020-05-24 ENCOUNTER — Other Ambulatory Visit: Payer: Medicare Other

## 2020-05-24 ENCOUNTER — Telehealth: Payer: Self-pay | Admitting: Internal Medicine

## 2020-05-24 DIAGNOSIS — Z20822 Contact with and (suspected) exposure to covid-19: Secondary | ICD-10-CM

## 2020-05-24 MED ORDER — PREDNISONE 10 MG PO TABS: ORAL_TABLET | ORAL | 0 refills | Status: DC

## 2020-05-24 MED ORDER — AZITHROMYCIN 250 MG PO TABS: 250.0000 mg | ORAL_TABLET | ORAL | 0 refills | Status: DC

## 2020-05-24 NOTE — Telephone Encounter (Signed)
Patient is returning phone call. Patient phone number is [phone].

## 2020-05-24 NOTE — Telephone Encounter (Signed)
zpak  Prednisone 10 mg take  4 each am x 2 days,   2 each am x 2 days,  1 each am x 2 days and stop   televist by end of week if not improving, to er if worse

## 2020-05-24 NOTE — Telephone Encounter (Signed)
°  Can try mucinex dm 1200 mg every 12 hours prn and use the flutter valve as much as possible in meantime and let the zpack/ pred have few days to see they help before considering adding tramadol (thus the rec for televist rec if not improving as can't do controlled drug like tramadol s ov of some type

## 2020-05-24 NOTE — Telephone Encounter (Signed)
Patient called she has been sick for about 1.5 weeks but has been getting worse with sore throat and cough and congestion in the last 5 days.  She is taking her nebulizer medication, her inhalers, and her cough syrup and nothing is helping. She is wheezing only she she coughs can she hear it.  She is very tired and weak, she thinks her lymph nodes are swelling.  She is fully vaccinated, but has not taken a covid test. I provider her with the number to call and set up an appointment to get covid tested [phone]. She believes it is an upper respiratory infection and would like to know if there is anything Dr. Melvyn Novas can do for until she is able to get tested.   Dr. Melvyn Novas please advise.   No Known Allergies  Current Outpatient Medications on File Prior to Visit  Medication Sig Dispense Refill  . albuterol (PROAIR HFA) 108 (90 Base) MCG/ACT inhaler Inhale 2 puffs into the lungs every 4 (four) hours as needed for wheezing or shortness of breath. 18 g 5  . albuterol (PROVENTIL) (2.5 MG/3ML) 0.083% nebulizer solution Take 2.5 mg by nebulization every 4 (four) hours as needed for wheezing or shortness of breath.     . ALPRAZolam (XANAX) 0.5 MG tablet Take 1 tablet (0.5 mg total) by mouth 2 (two) times daily as needed for anxiety. 30 tablet 0  . aspirin EC 81 MG tablet Take 81 mg by mouth daily.     Marland Kitchen atorvastatin (LIPITOR) 40 MG tablet Take 40 mg by mouth at bedtime.    . Biotin 5000 MCG CAPS Take 1 capsule by mouth daily.    . budesonide (PULMICORT) 0.5 MG/2ML nebulizer solution Take 2 mLs (0.5 mg total) by nebulization in the morning and at bedtime. 60 mL 11  . chlorpheniramine (CHLOR-TRIMETON) 4 MG tablet Take 4 mg by mouth 2 (two) times daily as needed for allergies or rhinitis.     . chlorthalidone (HYGROTON) 25 MG tablet Take 1 tablet (25 mg total) by mouth daily. 30 tablet 11  . dextromethorphan (DELSYM) 30 MG/5ML liquid Take 60 mg by mouth daily as needed for cough. 2 tsp every 12 hours as needed w/  flutter    . diltiazem (CARTIA XT) 240 MG 24 hr capsule Take 1 capsule (240 mg total) by mouth daily. 90 capsule 3  . divalproex (DEPAKOTE ER) 500 MG 24 hr tablet Take 2 tablets (1,000 mg total) by mouth at bedtime. 90 tablet 4  . esomeprazole (NEXIUM) 40 MG capsule Take 40 mg by mouth daily at 12 noon.    . famotidine (PEPCID) 20 MG tablet Take 1 tablet (20 mg total) by mouth daily. 30 tablet 0  . formoterol (PERFOROMIST) 20 MCG/2ML nebulizer solution Take 2 mLs (20 mcg total) by nebulization 2 (two) times daily. 120 mL 11  . hydrALAZINE (APRESOLINE) 50 MG tablet Take 1 tablet (50 mg total) by mouth 2 (two) times daily. 270 tablet 3  . latanoprost (XALATAN) 0.005 % ophthalmic solution     . LUMIGAN 0.01 % SOLN 1 drop at bedtime.    . memantine (NAMENDA) 10 MG tablet Take 1 tablet (10 mg total) by mouth 2 (two) times daily. 60 tablet 11  . metFORMIN (GLUCOPHAGE) 500 MG tablet Take 500 mg by mouth 2 (two) times daily with a meal.    . montelukast (SINGULAIR) 10 MG tablet TAKE 1 TABLET BY MOUTH AT BEDTIME 30 tablet 5  . Multiple Vitamin (MULTIVITAMIN WITH MINERALS)  TABS tablet Take 1 tablet by mouth daily.    . nebivolol (BYSTOLIC) 10 MG tablet Take 1 tablet (10 mg total) by mouth daily. 34 tablet 11  . nitroGLYCERIN (NITROSTAT) 0.4 MG SL tablet DISSOLVE ONE TABLET UNDER THE TONGUE EVERY 5 MINUTES AS NEEDED FOR CHEST PAIN.  DO NOT EXCEED A TOTAL OF 3 DOSES IN 15 MINUTES 25 tablet 0  . potassium chloride SA (K-DUR,KLOR-CON) 20 MEQ tablet Take 20 mEq by mouth daily.     Marland Kitchen Respiratory Therapy Supplies (FLUTTER) DEVI by Does not apply route.    . sucralfate (CARAFATE) 1 g tablet Take 1 tablet (1 g total) by mouth 4 (four) times daily -  with meals and at bedtime. 20 tablet 0  . torsemide (DEMADEX) 20 MG tablet Take 20 mg by mouth daily.    . traMADol (ULTRAM) 50 MG tablet Take 50 mg by mouth every 4 (four) hours as needed.     No current facility-administered medications on file prior to visit.    [employer] 02/06/20-06/16/19-1/15-21

## 2020-05-24 NOTE — Telephone Encounter (Signed)
Tried calling pt, no answer, LMTCB

## 2020-05-24 NOTE — Telephone Encounter (Signed)
Spoke with patient. She verbalized understanding of zpak and prednisone. Will send these to [facility] on [facility].   While on the phone, she stated that MW normally calls in Tramadol for the occasional chest pain she has. I advised her that if she is actively having chest pain, she needs to go to the ER. She stated that the chest pain is coming from her coughing so much and so hard. She wants to know if MW would be willing to send in Tramadol for her.   I attempted several times to get her scheduled for a televisit this week but she refused until she heard back in regards to the Tramadol.   MW, please advise. Thanks!

## 2020-05-24 NOTE — Telephone Encounter (Signed)
Spoke with the pt and notified of response per MW  She verbalized understanding  Rxs for pred and zpack sent  She will call for televisit if not improving

## 2020-05-25 ENCOUNTER — Encounter (HOSPITAL_COMMUNITY): Payer: Medicare Other

## 2020-05-25 LAB — NOVEL CORONAVIRUS, NAA: SARS-CoV-2, NAA: NOT DETECTED

## 2020-05-25 LAB — SARS-COV-2, NAA 2 DAY TAT

## 2020-05-27 ENCOUNTER — Telehealth: Payer: Self-pay | Admitting: Internal Medicine

## 2020-05-27 NOTE — Telephone Encounter (Signed)
Spoke with patient. She stated that she tried MW's suggestions of Mucinex and finishing the zpak and prednisone. She is not feeling any better. I advised her that MW stated that if she was not feeling any better, we needed to get her scheduled for a televisit. Offered a 4pm televisit today with TP, she stated that she could wait that long.   I then advised her if that her breathing was that bad, she needs to either call 911 or go to a local UC. She has decided to go to the UC. Advised her to call us back if she changes her mind.   Nothing further needed at time of call.

## 2020-05-31 ENCOUNTER — Other Ambulatory Visit: Payer: Self-pay

## 2020-05-31 ENCOUNTER — Ambulatory Visit (HOSPITAL_COMMUNITY): Payer: Medicare Other | Attending: Internal Medicine

## 2020-05-31 DIAGNOSIS — R079 Chest pain, unspecified: Secondary | ICD-10-CM | POA: Diagnosis present

## 2020-05-31 DIAGNOSIS — I2510 Atherosclerotic heart disease of native coronary artery without angina pectoris: Secondary | ICD-10-CM

## 2020-05-31 LAB — MYOCARDIAL PERFUSION IMAGING
LV dias vol: 57 mL (ref 46–106)
LV sys vol: 15 mL
Peak HR: 87 {beats}/min
Rest HR: 64 {beats}/min
SDS: 2
SRS: 0
SSS: 2
TID: 1.04

## 2020-05-31 MED ORDER — TECHNETIUM TC 99M TETROFOSMIN IV KIT
26.0000 | PACK | Freq: Once | INTRAVENOUS | Status: AC | PRN
  Administered 2020-05-31: 26 via INTRAVENOUS
  Filled 2020-05-31: qty 26

## 2020-05-31 MED ORDER — TECHNETIUM TC 99M TETROFOSMIN IV KIT
9.1000 | PACK | Freq: Once | INTRAVENOUS | Status: AC | PRN
  Administered 2020-05-31: 9.1 via INTRAVENOUS
  Filled 2020-05-31: qty 10

## 2020-05-31 MED ORDER — ADENOSINE (DIAGNOSTIC) 3 MG/ML IV SOLN
0.5600 mg/kg | Freq: Once | INTRAVENOUS | Status: AC
  Administered 2020-05-31: 36.9 mg via INTRAVENOUS

## 2020-06-04 ENCOUNTER — Other Ambulatory Visit: Payer: Self-pay

## 2020-06-04 ENCOUNTER — Encounter: Payer: Self-pay | Admitting: Podiatry

## 2020-06-04 ENCOUNTER — Ambulatory Visit (INDEPENDENT_AMBULATORY_CARE_PROVIDER_SITE_OTHER): Payer: Medicare Other | Admitting: Podiatry

## 2020-06-04 DIAGNOSIS — M79675 Pain in left toe(s): Secondary | ICD-10-CM

## 2020-06-04 DIAGNOSIS — B351 Tinea unguium: Secondary | ICD-10-CM | POA: Diagnosis not present

## 2020-06-04 DIAGNOSIS — M79674 Pain in right toe(s): Secondary | ICD-10-CM

## 2020-06-04 DIAGNOSIS — E119 Type 2 diabetes mellitus without complications: Secondary | ICD-10-CM | POA: Diagnosis not present

## 2020-06-10 NOTE — Progress Notes (Signed)
Subjective: [name] presents today for follow up of preventative diabetic foot care and painful mycotic nails b/l that are difficult to trim. Pain interferes with ambulation. Aggravating factors include wearing enclosed shoe gear. Pain is relieved with periodic professional debridement.  She states she doesn't check her blood glucose. She states she has some memory problems. She voices no new pedal problems on today's visit.  No Known Allergies   Objective: There were no vitals filed for this visit.  Vascular Examination:  Capillary refill time to digits immediate b/l, palpable DP pulses b/l, palpable PT pulses b/l, pedal hair present b/l and skin temperature gradient within normal limits b/l  Dermatological Examination: Pedal skin with normal turgor, texture and tone bilaterally, no open wounds bilaterally, toenails 1-5 b/l elongated, dystrophic, thickened, crumbly with subungual debris and there is resolution of tinea pedis noted b/l.  Musculoskeletal: Normal muscle strength 5/5 to all lower extremity muscle groups bilaterally, no gross bony deformities bilaterally, no pain crepitus or joint limitation noted with ROM b/l and patient ambulates independent of any assistive aids  Neurological: Protective sensation intact 5/5 intact bilaterally with 10g monofilament b/l and vibratory sensation intact b/l.  Hemoglobin A1C Latest Ref Rng & Units 09/14/2019  HGBA1C 4.8 - 5.6 % 6.5(H)  Some recent data might be hidden   Assessment: 1. Pain due to onychomycosis of toenails of both feet   2. Controlled type 2 diabetes mellitus without complication, without long-term current use of insulin ([place])    Plan: -Examined patient. -No new findings. No new orders. -Continue diabetic foot care principles. -Toenails 1-5 b/l were debrided in length and girth with sterile nail nippers and dremel without iatrogenic bleeding.  -Patient to report any pedal injuries to medical professional  immediately. -Patient to continue soft, supportive shoe gear daily. -Patient/POA to call should there be question/concern in the interim.  Return in about 3 months (around 09/01/2020).

## 2020-06-15 ENCOUNTER — Other Ambulatory Visit: Payer: Self-pay | Admitting: Physician Assistant

## 2020-06-15 ENCOUNTER — Ambulatory Visit: Payer: Medicare Other | Admitting: Neurology

## 2020-06-15 DIAGNOSIS — R1319 Other dysphagia: Secondary | ICD-10-CM

## 2020-06-15 DIAGNOSIS — K2100 Gastro-esophageal reflux disease with esophagitis, without bleeding: Secondary | ICD-10-CM

## 2020-06-16 ENCOUNTER — Emergency Department (HOSPITAL_COMMUNITY): Payer: Medicare Other

## 2020-06-16 ENCOUNTER — Encounter (HOSPITAL_COMMUNITY): Payer: Self-pay | Admitting: Internal Medicine

## 2020-06-16 ENCOUNTER — Inpatient Hospital Stay (HOSPITAL_COMMUNITY)
Admission: EM | Admit: 2020-06-16 | Discharge: 2020-06-19 | DRG: 178 | Disposition: A | Discharge status: Home / Self Care | Payer: Medicare Other | Attending: Internal Medicine | Admitting: Internal Medicine

## 2020-06-16 ENCOUNTER — Other Ambulatory Visit: Payer: Self-pay

## 2020-06-16 DIAGNOSIS — Z825 Family history of asthma and other chronic lower respiratory diseases: Secondary | ICD-10-CM

## 2020-06-16 DIAGNOSIS — Z833 Family history of diabetes mellitus: Secondary | ICD-10-CM

## 2020-06-16 DIAGNOSIS — N1831 Chronic kidney disease, stage 3a: Secondary | ICD-10-CM | POA: Diagnosis present

## 2020-06-16 DIAGNOSIS — I5032 Chronic diastolic (congestive) heart failure: Secondary | ICD-10-CM | POA: Diagnosis present

## 2020-06-16 DIAGNOSIS — N179 Acute kidney failure, unspecified: Secondary | ICD-10-CM | POA: Diagnosis present

## 2020-06-16 DIAGNOSIS — I252 Old myocardial infarction: Secondary | ICD-10-CM

## 2020-06-16 DIAGNOSIS — Z90710 Acquired absence of both cervix and uterus: Secondary | ICD-10-CM

## 2020-06-16 DIAGNOSIS — E1122 Type 2 diabetes mellitus with diabetic chronic kidney disease: Secondary | ICD-10-CM | POA: Diagnosis present

## 2020-06-16 DIAGNOSIS — Z8249 Family history of ischemic heart disease and other diseases of the circulatory system: Secondary | ICD-10-CM

## 2020-06-16 DIAGNOSIS — J069 Acute upper respiratory infection, unspecified: Secondary | ICD-10-CM | POA: Diagnosis present

## 2020-06-16 DIAGNOSIS — I130 Hypertensive heart and chronic kidney disease with heart failure and stage 1 through stage 4 chronic kidney disease, or unspecified chronic kidney disease: Secondary | ICD-10-CM | POA: Diagnosis present

## 2020-06-16 DIAGNOSIS — K219 Gastro-esophageal reflux disease without esophagitis: Secondary | ICD-10-CM | POA: Diagnosis present

## 2020-06-16 DIAGNOSIS — J441 Chronic obstructive pulmonary disease with (acute) exacerbation: Secondary | ICD-10-CM | POA: Diagnosis present

## 2020-06-16 DIAGNOSIS — J4541 Moderate persistent asthma with (acute) exacerbation: Secondary | ICD-10-CM | POA: Diagnosis present

## 2020-06-16 DIAGNOSIS — E119 Type 2 diabetes mellitus without complications: Secondary | ICD-10-CM

## 2020-06-16 DIAGNOSIS — U071 COVID-19: Principal | ICD-10-CM | POA: Diagnosis present

## 2020-06-16 DIAGNOSIS — E785 Hyperlipidemia, unspecified: Secondary | ICD-10-CM | POA: Diagnosis present

## 2020-06-16 DIAGNOSIS — F419 Anxiety disorder, unspecified: Secondary | ICD-10-CM | POA: Diagnosis present

## 2020-06-16 DIAGNOSIS — I10 Essential (primary) hypertension: Secondary | ICD-10-CM | POA: Diagnosis not present

## 2020-06-16 DIAGNOSIS — I2510 Atherosclerotic heart disease of native coronary artery without angina pectoris: Secondary | ICD-10-CM | POA: Diagnosis present

## 2020-06-16 DIAGNOSIS — J45901 Unspecified asthma with (acute) exacerbation: Secondary | ICD-10-CM | POA: Diagnosis present

## 2020-06-16 DIAGNOSIS — G40909 Epilepsy, unspecified, not intractable, without status epilepticus: Secondary | ICD-10-CM | POA: Diagnosis present

## 2020-06-16 DIAGNOSIS — R1310 Dysphagia, unspecified: Secondary | ICD-10-CM

## 2020-06-16 LAB — CBC WITH DIFFERENTIAL/PLATELET
Abs Immature Granulocytes: 0.02 10*3/uL (ref 0.00–0.07)
Basophils Absolute: 0 10*3/uL (ref 0.0–0.1)
Basophils Relative: 0 %
Eosinophils Absolute: 0 10*3/uL (ref 0.0–0.5)
Eosinophils Relative: 0 %
HCT: 35.9 % — ABNORMAL LOW (ref 36.0–46.0)
Hemoglobin: 12.2 g/dL (ref 12.0–15.0)
Immature Granulocytes: 0 %
Lymphocytes Relative: 52 %
Lymphs Abs: 3.6 10*3/uL (ref 0.7–4.0)
MCH: 32.4 pg (ref 26.0–34.0)
MCHC: 34 g/dL (ref 30.0–36.0)
MCV: 95.2 fL (ref 80.0–100.0)
Monocytes Absolute: 0.6 10*3/uL (ref 0.1–1.0)
Monocytes Relative: 8 %
Neutro Abs: 2.8 10*3/uL (ref 1.7–7.7)
Neutrophils Relative %: 40 %
Platelets: 220 10*3/uL (ref 150–400)
RBC: 3.77 MIL/uL — ABNORMAL LOW (ref 3.87–5.11)
RDW: 14 % (ref 11.5–15.5)
WBC: 7.1 10*3/uL (ref 4.0–10.5)
nRBC: 0 % (ref 0.0–0.2)

## 2020-06-16 LAB — RESP PANEL BY RT-PCR (FLU A&B, COVID) ARPGX2
Influenza A by PCR: NEGATIVE
Influenza B by PCR: NEGATIVE
SARS Coronavirus 2 by RT PCR: POSITIVE — AB

## 2020-06-16 LAB — BASIC METABOLIC PANEL
Anion gap: 14 (ref 5–15)
BUN: 40 mg/dL — ABNORMAL HIGH (ref 8–23)
CO2: 28 mmol/L (ref 22–32)
Calcium: 9.4 mg/dL (ref 8.9–10.3)
Chloride: 98 mmol/L (ref 98–111)
Creatinine, Ser: 1.74 mg/dL — ABNORMAL HIGH (ref 0.44–1.00)
GFR, Estimated: 31 mL/min — ABNORMAL LOW (ref >60)
Glucose, Bld: 150 mg/dL — ABNORMAL HIGH (ref 70–99)
Potassium: 2.7 mmol/L — CL (ref 3.5–5.1)
Sodium: 140 mmol/L (ref 135–145)

## 2020-06-16 LAB — PROCALCITONIN: Procalcitonin: <0.1 ng/mL

## 2020-06-16 LAB — LACTATE DEHYDROGENASE: LDH: 141 U/L (ref 98–192)

## 2020-06-16 LAB — HEMOGLOBIN A1C
Hgb A1c MFr Bld: 6.4 % — ABNORMAL HIGH (ref 4.8–5.6)
Mean Plasma Glucose: 136.98 mg/dL

## 2020-06-16 LAB — FERRITIN: Ferritin: 108 ng/mL (ref 11–307)

## 2020-06-16 LAB — GLUCOSE, CAPILLARY
Glucose-Capillary: 197 mg/dL — ABNORMAL HIGH (ref 70–99)
Glucose-Capillary: 169 mg/dL — ABNORMAL HIGH (ref 70–99)

## 2020-06-16 LAB — D-DIMER, QUANTITATIVE: D-Dimer, Quant: 0.31 ug/mL-FEU (ref 0.00–0.50)

## 2020-06-16 LAB — TROPONIN I (HIGH SENSITIVITY): Troponin I (High Sensitivity): 19 ng/L — ABNORMAL HIGH (ref <18)

## 2020-06-16 LAB — C-REACTIVE PROTEIN: CRP: 0.6 mg/dL (ref <1.0)

## 2020-06-16 LAB — VALPROIC ACID LEVEL: Valproic Acid Lvl: 34 ug/mL — ABNORMAL LOW (ref 50.0–100.0)

## 2020-06-16 LAB — CBG MONITORING, ED: Glucose-Capillary: 266 mg/dL — ABNORMAL HIGH (ref 70–99)

## 2020-06-16 LAB — FIBRINOGEN: Fibrinogen: 471 mg/dL (ref 210–475)

## 2020-06-16 MED ORDER — POTASSIUM CHLORIDE CRYS ER 20 MEQ PO TBCR
40.0000 meq | EXTENDED_RELEASE_TABLET | Freq: Once | ORAL | Status: AC
  Administered 2020-06-16: 40 meq via ORAL
  Filled 2020-06-16: qty 2

## 2020-06-16 MED ORDER — PANTOPRAZOLE SODIUM 40 MG PO TBEC
40.0000 mg | DELAYED_RELEASE_TABLET | Freq: Once | ORAL | Status: AC
  Administered 2020-06-16: 40 mg via ORAL
  Filled 2020-06-16: qty 1

## 2020-06-16 MED ORDER — HYDRALAZINE HCL 50 MG PO TABS
50.0000 mg | ORAL_TABLET | Freq: Two times a day (BID) | ORAL | Status: DC
Start: 2020-06-16 — End: 2020-06-19
  Administered 2020-06-16 – 2020-06-19 (×7): 50 mg via ORAL
  Filled 2020-06-16 (×3): qty 1
  Filled 2020-06-16: qty 2
  Filled 2020-06-16 (×3): qty 1

## 2020-06-16 MED ORDER — ONDANSETRON HCL 4 MG PO TABS: 4.0000 mg | ORAL_TABLET | Freq: Four times a day (QID) | ORAL | Status: DC | PRN

## 2020-06-16 MED ORDER — METHYLPREDNISOLONE SODIUM SUCC 500 MG IJ SOLR
0.5000 mg/kg | Freq: Two times a day (BID) | INTRAMUSCULAR | Status: DC
  Filled 2020-06-16: qty 34

## 2020-06-16 MED ORDER — METHYLPREDNISOLONE SODIUM SUCC 500 MG IJ SOLR: 0.5000 mg/kg | Freq: Two times a day (BID) | INTRAMUSCULAR | Status: DC

## 2020-06-16 MED ORDER — SODIUM CHLORIDE 0.9% FLUSH
3.0000 mL | Freq: Two times a day (BID) | INTRAVENOUS | Status: DC
  Administered 2020-06-18 – 2020-06-19 (×2): 3 mL via INTRAVENOUS

## 2020-06-16 MED ORDER — MAGNESIUM SULFATE 2 GM/50ML IV SOLN
2.0000 g | Freq: Once | INTRAVENOUS | Status: AC
  Administered 2020-06-16: 2 g via INTRAVENOUS
  Filled 2020-06-16: qty 50

## 2020-06-16 MED ORDER — ATORVASTATIN CALCIUM 40 MG PO TABS
40.0000 mg | ORAL_TABLET | Freq: Every day | ORAL | Status: DC
  Administered 2020-06-16 – 2020-06-18 (×3): 40 mg via ORAL
  Filled 2020-06-16 (×3): qty 1

## 2020-06-16 MED ORDER — PREDNISONE 20 MG PO TABS: 50.0000 mg | ORAL_TABLET | Freq: Every day | ORAL | Status: DC

## 2020-06-16 MED ORDER — NEBIVOLOL HCL 10 MG PO TABS
10.0000 mg | ORAL_TABLET | Freq: Every day | ORAL | Status: DC
  Administered 2020-06-16 – 2020-06-19 (×4): 10 mg via ORAL
  Filled 2020-06-16 (×4): qty 1

## 2020-06-16 MED ORDER — ALBUTEROL SULFATE HFA 108 (90 BASE) MCG/ACT IN AERS
2.0000 | INHALATION_SPRAY | RESPIRATORY_TRACT | Status: DC | PRN
  Filled 2020-06-16: qty 6.7

## 2020-06-16 MED ORDER — SODIUM CHLORIDE 0.9 % IV SOLN
100.0000 mg | Freq: Every day | INTRAVENOUS | Status: AC
  Administered 2020-06-17 – 2020-06-18 (×2): 100 mg via INTRAVENOUS
  Filled 2020-06-16 (×2): qty 20

## 2020-06-16 MED ORDER — GUAIFENESIN-DM 100-10 MG/5ML PO SYRP
10.0000 mL | ORAL_SOLUTION | ORAL | Status: DC | PRN
  Administered 2020-06-17 – 2020-06-18 (×2): 10 mL via ORAL
  Filled 2020-06-16 (×2): qty 10

## 2020-06-16 MED ORDER — INSULIN ASPART 100 UNIT/ML ~~LOC~~ SOLN: 0.0000 [IU] | Freq: Every day | SUBCUTANEOUS | Status: DC

## 2020-06-16 MED ORDER — PREDNISONE 5 MG PO TABS: 50.0000 mg | ORAL_TABLET | Freq: Every day | ORAL | Status: DC

## 2020-06-16 MED ORDER — DIVALPROEX SODIUM ER 500 MG PO TB24
1000.0000 mg | ORAL_TABLET | Freq: Every day | ORAL | Status: DC
  Administered 2020-06-16 – 2020-06-18 (×3): 1000 mg via ORAL
  Filled 2020-06-16 (×4): qty 2

## 2020-06-16 MED ORDER — PANTOPRAZOLE SODIUM 40 MG PO TBEC
40.0000 mg | DELAYED_RELEASE_TABLET | Freq: Two times a day (BID) | ORAL | Status: DC
  Administered 2020-06-17 – 2020-06-19 (×5): 40 mg via ORAL
  Filled 2020-06-16 (×5): qty 1

## 2020-06-16 MED ORDER — ASPIRIN EC 81 MG PO TBEC
81.0000 mg | DELAYED_RELEASE_TABLET | Freq: Every day | ORAL | Status: DC
Start: 2020-06-16 — End: 2020-06-16

## 2020-06-16 MED ORDER — DEXAMETHASONE SODIUM PHOSPHATE 10 MG/ML IJ SOLN
10.0000 mg | Freq: Once | INTRAMUSCULAR | Status: AC
  Administered 2020-06-16: 10 mg via INTRAVENOUS
  Filled 2020-06-16: qty 1

## 2020-06-16 MED ORDER — MEMANTINE HCL 10 MG PO TABS
10.0000 mg | ORAL_TABLET | Freq: Two times a day (BID) | ORAL | Status: DC
  Administered 2020-06-16 – 2020-06-19 (×7): 10 mg via ORAL
  Filled 2020-06-16 (×8): qty 1

## 2020-06-16 MED ORDER — POLYETHYLENE GLYCOL 3350 17 G PO PACK: 17.0000 g | PACK | Freq: Every day | ORAL | Status: DC | PRN

## 2020-06-16 MED ORDER — TRAMADOL HCL 50 MG PO TABS: 50.0000 mg | ORAL_TABLET | ORAL | Status: DC | PRN

## 2020-06-16 MED ORDER — IPRATROPIUM BROMIDE HFA 17 MCG/ACT IN AERS
2.0000 | INHALATION_SPRAY | Freq: Four times a day (QID) | RESPIRATORY_TRACT | Status: DC
  Administered 2020-06-16 – 2020-06-19 (×13): 2 via RESPIRATORY_TRACT
  Filled 2020-06-16 (×2): qty 12.9

## 2020-06-16 MED ORDER — ONDANSETRON HCL 4 MG/2ML IJ SOLN: 4.0000 mg | Freq: Four times a day (QID) | INTRAMUSCULAR | Status: DC | PRN

## 2020-06-16 MED ORDER — MONTELUKAST SODIUM 10 MG PO TABS
10.0000 mg | ORAL_TABLET | Freq: Every day | ORAL | Status: DC
  Administered 2020-06-16 – 2020-06-18 (×3): 10 mg via ORAL
  Filled 2020-06-16 (×4): qty 1

## 2020-06-16 MED ORDER — FENTANYL CITRATE (PF) 100 MCG/2ML IJ SOLN
50.0000 ug | Freq: Once | INTRAMUSCULAR | Status: AC
  Administered 2020-06-16: 50 ug via INTRAVENOUS
  Filled 2020-06-16: qty 2

## 2020-06-16 MED ORDER — SODIUM CHLORIDE 0.9 % IV SOLN
200.0000 mg | Freq: Once | INTRAVENOUS | Status: AC
  Administered 2020-06-16: 200 mg via INTRAVENOUS
  Filled 2020-06-16: qty 40

## 2020-06-16 MED ORDER — ENOXAPARIN SODIUM 40 MG/0.4ML ~~LOC~~ SOLN
40.0000 mg | SUBCUTANEOUS | Status: DC
  Administered 2020-06-16 – 2020-06-18 (×3): 40 mg via SUBCUTANEOUS
  Filled 2020-06-16 (×3): qty 0.4

## 2020-06-16 MED ORDER — DILTIAZEM HCL ER COATED BEADS 240 MG PO CP24
240.0000 mg | ORAL_CAPSULE | Freq: Every day | ORAL | Status: DC
Start: 2020-06-16 — End: 2020-06-19
  Administered 2020-06-16 – 2020-06-19 (×4): 240 mg via ORAL
  Filled 2020-06-16 (×4): qty 1

## 2020-06-16 MED ORDER — FLEET ENEMA 7-19 GM/118ML RE ENEM: 1.0000 | ENEMA | Freq: Once | RECTAL | Status: DC | PRN

## 2020-06-16 MED ORDER — INSULIN ASPART 100 UNIT/ML ~~LOC~~ SOLN
0.0000 [IU] | Freq: Three times a day (TID) | SUBCUTANEOUS | Status: DC
  Administered 2020-06-16: 3 [IU] via SUBCUTANEOUS
  Administered 2020-06-16: 8 [IU] via SUBCUTANEOUS
  Administered 2020-06-17: 2 [IU] via SUBCUTANEOUS
  Administered 2020-06-17 – 2020-06-18 (×3): 3 [IU] via SUBCUTANEOUS
  Administered 2020-06-18: 8 [IU] via SUBCUTANEOUS
  Administered 2020-06-18: 3 [IU] via SUBCUTANEOUS
  Administered 2020-06-19 (×2): 5 [IU] via SUBCUTANEOUS

## 2020-06-16 MED ORDER — HYDROCOD POLST-CPM POLST ER 10-8 MG/5ML PO SUER
5.0000 mL | Freq: Two times a day (BID) | ORAL | Status: DC | PRN
  Administered 2020-06-17: 5 mL via ORAL
  Filled 2020-06-16: qty 5

## 2020-06-16 MED ORDER — ACETAMINOPHEN 325 MG PO TABS: 650.0000 mg | ORAL_TABLET | Freq: Four times a day (QID) | ORAL | Status: DC | PRN

## 2020-06-16 MED ORDER — ZINC SULFATE 220 (50 ZN) MG PO CAPS
220.0000 mg | ORAL_CAPSULE | Freq: Every day | ORAL | Status: DC
  Administered 2020-06-16 – 2020-06-19 (×4): 220 mg via ORAL
  Filled 2020-06-16 (×4): qty 1

## 2020-06-16 MED ORDER — HYDRALAZINE HCL 20 MG/ML IJ SOLN: 5.0000 mg | INTRAMUSCULAR | Status: DC | PRN

## 2020-06-16 MED ORDER — POTASSIUM CHLORIDE CRYS ER 20 MEQ PO TBCR
20.0000 meq | EXTENDED_RELEASE_TABLET | Freq: Every day | ORAL | Status: DC
  Administered 2020-06-16 – 2020-06-19 (×4): 20 meq via ORAL
  Filled 2020-06-16 (×4): qty 1

## 2020-06-16 MED ORDER — ASCORBIC ACID 500 MG PO TABS
500.0000 mg | ORAL_TABLET | Freq: Every day | ORAL | Status: DC
  Administered 2020-06-16 – 2020-06-19 (×4): 500 mg via ORAL
  Filled 2020-06-16 (×4): qty 1

## 2020-06-16 MED ORDER — SODIUM CHLORIDE 0.9% FLUSH: 3.0000 mL | INTRAVENOUS | Status: DC | PRN

## 2020-06-16 MED ORDER — OXYCODONE HCL 5 MG PO TABS
5.0000 mg | ORAL_TABLET | ORAL | Status: DC | PRN
  Administered 2020-06-16 – 2020-06-19 (×6): 5 mg via ORAL
  Filled 2020-06-16 (×6): qty 1

## 2020-06-16 MED ORDER — ALBUTEROL (5 MG/ML) CONTINUOUS INHALATION SOLN
15.0000 mg/h | INHALATION_SOLUTION | Freq: Once | RESPIRATORY_TRACT | Status: AC
  Administered 2020-06-16: 15 mg/h via RESPIRATORY_TRACT
  Filled 2020-06-16: qty 20

## 2020-06-16 MED ORDER — BISACODYL 5 MG PO TBEC: 5.0000 mg | DELAYED_RELEASE_TABLET | Freq: Every day | ORAL | Status: DC | PRN

## 2020-06-16 MED ORDER — PANTOPRAZOLE SODIUM 40 MG PO TBEC
40.0000 mg | DELAYED_RELEASE_TABLET | Freq: Every day | ORAL | Status: DC
  Administered 2020-06-16: 40 mg via ORAL
  Filled 2020-06-16: qty 1

## 2020-06-16 MED ORDER — ALPRAZOLAM 0.5 MG PO TABS
0.5000 mg | ORAL_TABLET | Freq: Two times a day (BID) | ORAL | Status: DC | PRN
  Administered 2020-06-16 – 2020-06-18 (×2): 0.5 mg via ORAL
  Filled 2020-06-16 (×2): qty 1

## 2020-06-16 MED ORDER — SODIUM CHLORIDE 0.9% FLUSH
3.0000 mL | Freq: Two times a day (BID) | INTRAVENOUS | Status: DC
  Administered 2020-06-16 – 2020-06-19 (×4): 3 mL via INTRAVENOUS

## 2020-06-16 MED ORDER — SODIUM CHLORIDE 0.9 % IV SOLN: 250.0000 mL | INTRAVENOUS | Status: DC | PRN

## 2020-06-16 NOTE — ED Notes (Signed)
Attempted to give reportx1 

## 2020-06-16 NOTE — H&P (Signed)
History and Physical    [name] MWN:[id number] DOB: 1951/04/19 DOA: 06/16/2020  PCP: [name], MD Consultants:  [facility] - GI; [place] - podiatry; [place] - pulmonology; Marlou Porch - cardiology Patient coming from:  Home - lives with husband, daughter, grandchildren; NOK: [name], [phone]  Chief Complaint:  Wheezing, SOB  HPI: [name] is a 70 y.o. female with medical history significant of CAD; HLD; DM; chronic diastolic CHF; and asthma presenting with wheezing, SOB.  She reports chronic suboptimally controlled asthma for which she sees Dr. Melvyn Novas.  She called him on 1/24 with report of 1.5 week history of respiratory illness with sore throat, cough, congestion.  She was using inhalers and cough syrup without relief.  She is vaccinated and boosted but went for COVID testing and this was negative.  Dr. Melvyn Novas called in Mucinex DM and azithromycin/prednisone.  She did not feel better on 1/27 and decided to go to Urgent Care.  She was seen by podiatry on 2/4 with no mention of respiratory issues.  She reports that her cough and wheeze has gotten worse again in the last few days.  She has been having throat discomfort as well as loss of taste.  She denies fever.  +wheezing.    ED Course:  Asthma - recently steroids, antibiotics.  Continued wheezing.  Given continuous neb, Decadron, Mag++.  COVID + - no PNA but probably triggering asthma attack.  Review of Systems: As per HPI; otherwise review of systems reviewed and negative.   Ambulatory Status:  Ambulates without assistance  COVID Vaccine Status:   Complete plus booster  Past Medical History:  Diagnosis Date  . Anxiety   . Asthma   . Bronchitis   . Chronic diastolic CHF (congestive heart failure) ([place]) 07/17/2016  . Coronary artery disease    a. NSTEMI 12/2015 - [facility] 01/18/16: s/p overlapping DESx2 to RCA, 10% ost-prox Cx,10% mLAD.  Marland Kitchen Depression   . Diabetes mellitus   . Dyslipidemia   . GERD (gastroesophageal  reflux disease)   . Heart attack ([place])   . Hypertension   . Hypertensive heart disease   . NSTEMI (non-ST elevated myocardial infarction) ([place]) 12/2015  . Pneumonia     Past Surgical History:  Procedure Laterality Date  . ABDOMINAL HYSTERECTOMY    . CARDIAC CATHETERIZATION  1994   minimal LAD dz, no other dz, EF normal  . CARDIAC CATHETERIZATION N/A 01/18/2016   Procedure: Left Heart Cath and Coronary Angiography;  Surgeon: Burnell Blanks, MD;  Location: [facility];  Service: Cardiovascular;  Laterality: N/A;  . CARDIAC CATHETERIZATION N/A 01/18/2016   Procedure: Coronary Stent Intervention;  Surgeon: Burnell Blanks, MD;  Location: [facility];  Service: Cardiovascular;  Laterality: N/A;  . COLONOSCOPY WITH PROPOFOL N/A 04/09/2014   Procedure: COLONOSCOPY WITH PROPOFOL;  Surgeon: Cleotis Nipper, MD;  Location: [facility];  Service: Endoscopy;  Laterality: N/A;  . CORONARY STENT PLACEMENT  01/19/2016   STENT SYNERGY DES [id number] drug eluting stent was successfully placed, and overlaps the 2.5 x 38 mm Synergy stent placed distally.  . ESOPHAGOGASTRODUODENOSCOPY (EGD) WITH PROPOFOL N/A 04/09/2014   Procedure: ESOPHAGOGASTRODUODENOSCOPY (EGD) WITH PROPOFOL;  Surgeon: Cleotis Nipper, MD;  Location: [facility];  Service: Endoscopy;  Laterality: N/A;  . HEMORRHOID SURGERY    . HERNIA REPAIR    . KNEE ARTHROSCOPY      Social History   Socioeconomic History  . Marital status: Married    Spouse name:  Not on file  . Number of children: 3  . Years of education: some college  . Highest education level: Not on file  Occupational History  . Occupation: Nutritional therapist  Tobacco Use  . Smoking status: Former Smoker    Packs/day: 1.00    Years: 30.00    Pack years: 30.00    Types: Cigarettes    Quit date: 01/09/2004    Years since quitting: 16.4  . Smokeless tobacco: Never Used  Vaping Use  . Vaping Use: Never used  Substance and Sexual Activity  .  Alcohol use: No    Alcohol/week: 0.0 standard drinks  . Drug use: No  . Sexual activity: Not on file  Other Topics Concern  . Not on file  Social History Narrative   Pt lives with husband in [place].   Right-handed.   2 cups caffeine per day.   Social Determinants of Health   Financial Resource Strain: Not on file  Food Insecurity: Not on file  Transportation Needs: Not on file  Physical Activity: Not on file  Stress: Not on file  Social Connections: Not on file  Intimate Partner Violence: Not on file    No Known Allergies  Family History  Problem Relation Age of Onset  . Allergies Mother   . Asthma Mother   . CAD Father 57  . Diabetes Father   . Hypertension Father   . Congestive Heart Failure Sister        died in her 22s  . CAD Sister 18    Prior to Admission medications   Medication Sig Start Date End Date Taking? Authorizing Provider  albuterol (PROAIR HFA) 108 (90 Base) MCG/ACT inhaler Inhale 2 puffs into the lungs every 4 (four) hours as needed for wheezing or shortness of breath. 04/08/19   Tanda Rockers, MD  albuterol (PROVENTIL) (2.5 MG/3ML) 0.083% nebulizer solution Take 2.5 mg by nebulization every 4 (four) hours as needed for wheezing or shortness of breath.     [provider]  ALPRAZolam Duanne Moron) 0.5 MG tablet Take 1 tablet (0.5 mg total) by mouth 2 (two) times daily as needed for anxiety. 07/31/15   Theodis Blaze, MD  aspirin EC 81 MG tablet Take 81 mg by mouth daily.     [provider]  atorvastatin (LIPITOR) 40 MG tablet Take 40 mg by mouth at bedtime.    [provider]  azithromycin (ZITHROMAX) 250 MG tablet Take 1 tablet (250 mg total) by mouth as directed. 05/24/20   Tanda Rockers, MD  Biotin 5000 MCG CAPS Take 1 capsule by mouth daily.    [provider]  budesonide (PULMICORT) 0.5 MG/2ML nebulizer solution Take 2 mLs (0.5 mg total) by nebulization in the morning and at bedtime. 01/13/20   Tanda Rockers, MD   chlorpheniramine (CHLOR-TRIMETON) 4 MG tablet Take 4 mg by mouth 2 (two) times daily as needed for allergies or rhinitis.     [provider]  chlorthalidone (HYGROTON) 25 MG tablet Take 1 tablet (25 mg total) by mouth daily. 02/10/20   Jerline Pain, MD  dextromethorphan (DELSYM) 30 MG/5ML liquid Take 60 mg by mouth daily as needed for cough. 2 tsp every 12 hours as needed w/ flutter    [provider]  diltiazem (CARTIA XT) 240 MG 24 hr capsule Take 1 capsule (240 mg total) by mouth daily. 02/17/20   Jerline Pain, MD  divalproex (DEPAKOTE ER) 500 MG 24 hr tablet Take 2 tablets (  1,000 mg total) by mouth at bedtime. 12/08/19   Marcial Pacas, MD  esomeprazole (NEXIUM) 40 MG capsule Take 40 mg by mouth daily at 12 noon.    [provider]  famotidine (PEPCID) 20 MG tablet Take 1 tablet (20 mg total) by mouth daily. 04/30/20   Larene Pickett, PA-C  formoterol (PERFOROMIST) 20 MCG/2ML nebulizer solution Take 2 mLs (20 mcg total) by nebulization 2 (two) times daily. 03/30/20   Tanda Rockers, MD  hydrALAZINE (APRESOLINE) 50 MG tablet Take 1 tablet (50 mg total) by mouth 2 (two) times daily. 03/24/20   Jerline Pain, MD  latanoprost (XALATAN) 0.005 % ophthalmic solution  02/05/20   [provider]  LUMIGAN 0.01 % SOLN 1 drop at bedtime. 02/03/20   [provider]  memantine (NAMENDA) 10 MG tablet Take 1 tablet (10 mg total) by mouth 2 (two) times daily. 07/08/19   Suzzanne Cloud, NP  metFORMIN (GLUCOPHAGE) 500 MG tablet Take 500 mg by mouth 2 (two) times daily with a meal.    [provider]  montelukast (SINGULAIR) 10 MG tablet TAKE 1 TABLET BY MOUTH AT BEDTIME 02/13/20   Lauraine Rinne, NP  Multiple Vitamin (MULTIVITAMIN WITH MINERALS) TABS tablet Take 1 tablet by mouth daily.    [provider]  nebivolol (BYSTOLIC) 10 MG tablet Take 1 tablet (10 mg total) by mouth daily. 04/10/19   Jerline Pain, MD  nitroGLYCERIN (NITROSTAT) 0.4 MG SL  tablet DISSOLVE ONE TABLET UNDER THE TONGUE EVERY 5 MINUTES AS NEEDED FOR CHEST PAIN.  DO NOT EXCEED A TOTAL OF 3 DOSES IN 15 MINUTES 11/19/19   Burtis Junes, NP  potassium chloride SA (K-DUR,KLOR-CON) 20 MEQ tablet Take 20 mEq by mouth daily.     [provider]  predniSONE (DELTASONE) 10 MG tablet 4 x 2 days, 2 x 2 days, 1 x 2 days, then stop 05/24/20   Tanda Rockers, MD  Respiratory Therapy Supplies (FLUTTER) DEVI by Does not apply route.    [provider]  sucralfate (CARAFATE) 1 g tablet Take 1 tablet (1 g total) by mouth 4 (four) times daily -  with meals and at bedtime. 04/30/20   Larene Pickett, PA-C  torsemide (DEMADEX) 20 MG tablet Take 20 mg by mouth daily. 10/25/16   [provider]  traMADol (ULTRAM) 50 MG tablet Take 50 mg by mouth every 4 (four) hours as needed. 02/13/20   [provider]    Physical Exam: Vitals:   06/16/20 1245 06/16/20 1556 06/16/20 1600 06/16/20 1615  BP: 135/81 123/67 122/71 137/75  Pulse: (!) 114 (!) 106 (!) 107 (!) 106  Resp: 16 15 19 16   Temp:      TempSrc:      SpO2: 99% 98% 97% 99%  Weight:         . General:  Appears calm and comfortable and is in NAD . Eyes:  PERRL, EOMI, normal lids, iris . ENT:  grossly normal hearing, lips & tongue, mmm . Neck:  no LAD, masses or thyromegaly . Cardiovascular:  RRR, no m/r/g. No LE edema.  Marland Kitchen Respiratory:   CTA bilaterally with mild intermittent wheezing and reasonable air movement.  Normal respiratory effort. . Abdomen:  soft, NT, ND, NABS . Back:   normal alignment, no CVAT . Skin:  no rash or induration seen on limited exam . Musculoskeletal:  grossly normal tone BUE/BLE, good ROM, no bony abnormality . Psychiatric:  Blunted mood  and affect, speech fluent and appropriate, AOx3 . Neurologic:  CN 2-12 grossly intact, moves all extremities in coordinated fashion    Radiological Exams on Admission: Independently reviewed - see discussion in A/P where  applicable  DG Chest Portable 1 View  Result Date: 06/16/2020 CLINICAL DATA:  Shortness of breath. EXAM: PORTABLE CHEST 1 VIEW COMPARISON:  04/30/2020. FINDINGS: Mediastinum and hilar structures normal. Lungs are clear. No pleural effusion or pneumothorax. Heart size normal. Degenerative changes scoliosis thoracic spine. IMPRESSION: No acute cardiopulmonary disease. Electronically Signed   By: Marcello Moores  Register   On: 06/16/2020 06:11    EKG: Independently reviewed.  Sinus tachycardia with rate 134; nonspecific ST changes that are likely rate related   Labs on Admission: I have personally reviewed the available labs and imaging studies at the time of the admission.  Pertinent labs:   K+ 2.7 Glucose 150 BUN 40/Creatinine 1.74/GFR 31; baseline creatinine about 1.2-1.3, GFR 50s WBC 7.1 COVID POSITIVE LDH 141 Ferritin 108 CRP 0.6 Procalcitonin <0.10 D-dimer 0.31 Fibrinogen 471 A1c 6.4   Assessment/Plan Principal Problem:   Acute respiratory disease due to COVID-19 virus Active Problems:   Essential hypertension   Diabetes mellitus type 2, controlled (HCC)   Asthma exacerbation   CAD in native artery   HLD (hyperlipidemia)   Chronic diastolic CHF (congestive heart failure) ([place])   COVID 19 infection -Patient with presenting with SOB and wheezing in the setting of known asthma -Mild diarrhea and loss of taste noted  -She does not have a usual home O2 requirement and not currently on O2 -COVID POSITIVE -The patient has comorbidities which may increase the risk for ARDS/MODS including: age, HTN, DM, asthma, CAD, CHF -COVID labs are remarkably normal -CXR negative for acute disease  -Will not treat with broad-spectrum antibiotics given procalcitonin <0.5 -Will admit for further evaluation, close monitoring, and treatment -Monitor on telemetry x at least 24 hours -At this time, will attempt to avoid use of aerosolized medications and use HFAs instead -Will check daily labs  including BMP with Mag, Phos; LFTs; CBC with differential; CRP; ferritin; fibrinogen; D-dimer -Will order steroids and Remdesivir (pharmacy consult) asthma + COVID -If she continues to be relatively asymptomatic, a 3-day course of remdesivir may be appropriate -PT/OT consults -Encourage mobilization/ambulation as much as possible -Patient was seen wearing full PPE including: gown, gloves, head cover, N95, and face shield; donning and doffing was in compliance with current standards.  Asthma with acute exacerbation -She has moderate to severe persistent asthma -Upon presentation, her primary issue was an asthma exacerbation -This may be related to COVID infection or may be the primary presenting issue -Will hold neb treatments in the setting of COVID infection -Treat with ipratopium inhaler q6h and prn Albuterol HFA -Continue Singulair  AKI on stage 3a CKD -Baseline creatinine is about 1.2 -Today's creatinine is 1.74 -Likely associated with recent illness and decreased PO intake in the setting of diuretic use -Will hold Chlorthalidone  Seizure d/o -Continue Depakote -Will check level -Stop Tramadol and will add to her drug allergy list  DM -A1c is 6.4, indicating good control -May utilize continuous glucose monitoring -hold Glucophage -Cover with moderate-scale SSI  HTN -Continue Diltiazem, hydralazine, Bystolic -Hold Chlorthalidone  HLD -Continue Lipitor  CAD -Interestingly, she does not appear to be taking ASA  Chronic diastolic CHF -Echo in 10/8936 showed preserved EF and grade 2 diastolic dysfunction -Appears to be compensated at this time  Mood/memory -Continue Xanax as needed -Continue Namenda    Level  of care: Telemetry Medical DVT prophylaxis:  Lovenox  Code Status:  Full - confirmed with patient Family Communication: None present; I spoke with the patient's husband by telephone. Disposition Plan:  The patient is from: home  Anticipated d/c is to: home  without [facility] services once her respiratory issues have been resolved.    Anticipated d/c date will depend on clinical response to treatment, likely between 3 days (with completion of outpatient Remdesivir treatment) and 5 days  Patient is currently: acutely ill Consults called: PT/OT  Admission status: Admit - It is my clinical opinion that admission to [facility] is reasonable and necessary because of the expectation that this patient will require hospital care that crosses at least 2 midnights to treat this condition based on the medical complexity of the problems presented.  Given the aforementioned information, the predictability of an adverse outcome is felt to be significant.     Karmen Bongo MD [facility]   How to contact the [facility] Attending or Consulting provider [place] or covering provider during after hours [place], for this patient?  1. Check the care team in [facility] and look for a) attending/consulting [facility] provider listed and b) the [facility] team listed 2. Log into [email or web address] and use [place]'s universal password to access. If you do not have the password, please contact the hospital operator. 3. Locate the [facility] provider you are looking for under [facility] and page to a number that you can be directly reached. 4. If you still have difficulty reaching the provider, please page the [facility] (Director on Call) for the Hospitalists listed on amion for assistance.   06/16/2020, 4:25 PM

## 2020-06-16 NOTE — ED Provider Notes (Signed)
[facility]ERGENCY DEPARTMENT Provider Note   CSN: [id number] Arrival date & time: 06/16/20  0814     History Chief Complaint  Patient presents with  . Asthma    [name] is a 70 y.o. female.  HPI     This is a 70 year old female with a history of asthma, diastolic CHF, coronary artery disease, hypertension who presents with shortness of breath.  Patient reports she has had issues with shortness of breath and asthma on and off for several weeks.  She had a cold several weeks ago and received steroids and antibiotics from her pulmonologist.  She reports that she woke up this morning acutely short of breath.  She took a breathing treatment with no improvement.  Patient reports a persistent nonproductive cough.  Reports negative Covid testing recently.  She is fully vaccinated.  No fevers.  She reports significant shortness of breath.  She denies chest pain at this time.  Past Medical History:  Diagnosis Date  . Anxiety   . Asthma   . Bronchitis   . Chronic diastolic CHF (congestive heart failure) ([place])   . Chronic diastolic CHF (congestive heart failure) ([place]) 07/17/2016  . Coronary artery disease    a. NSTEMI 12/2015 - LHC 01/18/16: s/p overlapping DESx2 to RCA, 10% ost-prox Cx,10% mLAD.  Marland Kitchen Depression   . Diabetes mellitus   . Dyslipidemia   . GERD (gastroesophageal reflux disease)   . Heart attack ([place])   . Hypertension   . Hypertensive heart disease   . NSTEMI (non-ST elevated myocardial infarction) ([place]) 12/2015  . Pneumonia     Patient Active Problem List   Diagnosis Date Noted  . Wheezing 09/13/2019  . Memory loss 03/20/2019  . Severe persistent asthma without complication 48/18/5631  . DM2 (diabetes mellitus, type 2) ([place]) 03/15/2018  . CKD (chronic kidney disease), stage III ([place]) 03/15/2018  . Dyspnea 08/12/2017  . Macrocytic anemia 08/12/2017  . Therapeutic drug monitoring 05/17/2017  . Lung nodule 08/01/2016  . Influenza B  07/18/2016  . Chronic diastolic CHF (congestive heart failure) ([place]) 07/17/2016  . Acute renal failure superimposed on stage 3 chronic kidney disease ([place]) 07/17/2016  . Right leg swelling 02/27/2016  . Acute respiratory failure with hypoxia ([place])   . Uncontrolled type 2 diabetes mellitus with complication ([place])   . Acute pulmonary embolism ([place])   . HLD (hyperlipidemia)   . Seizures ([place]) 01/27/2016  . Numbness 01/27/2016  . CAD in native artery 01/20/2016  . Hypertensive heart disease 01/20/2016  . NSTEMI (non-ST elevated myocardial infarction) ([place]) 01/18/2016  . Acute bronchitis with asthma with acute exacerbation 01/16/2016  . Hypokalemia 01/16/2016  . Hypomagnesemia 01/16/2016  . Acute respiratory failure ([place]) 01/16/2016  . Dyspnea on exertion   . Asthma exacerbation 07/21/2015  . Upper airway cough syndrome with VCD 08/28/2014  . Diabetes mellitus type 2, controlled ([place]) 08/06/2014  . Hyperlipidemia 08/06/2014  . Allergic rhinitis 07/22/2008  . 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 DYSPNEA 07/22/2008  . Diabetes mellitus type 2, uncontrolled ([place]) 07/21/2008  . DIABETIC PERIPHERAL NEUROPATHY 07/21/2008  . Depression with anxiety 07/21/2008  . Essential hypertension 07/21/2008  . Mild persistent chronic asthma without complication 49/70/2637  . G E R D 07/21/2008  . PULMONARY EMBOLISM, HX OF 07/21/2008    Past Surgical History:  Procedure Laterality Date  . ABDOMINAL HYSTERECTOMY    . CARDIAC CATHETERIZATION  1994   minimal LAD dz, no other dz, EF normal  .  CARDIAC CATHETERIZATION N/A 01/18/2016   Procedure: Left Heart Cath and Coronary Angiography;  Surgeon: Burnell Blanks, MD;  Location: [facility];  Service: Cardiovascular;  Laterality: N/A;  . CARDIAC CATHETERIZATION N/A 01/18/2016   Procedure: Coronary Stent Intervention;  Surgeon: Burnell Blanks, MD;  Location: [facility];  Service: Cardiovascular;  Laterality: N/A;  .  COLONOSCOPY WITH PROPOFOL N/A 04/09/2014   Procedure: COLONOSCOPY WITH PROPOFOL;  Surgeon: Cleotis Nipper, MD;  Location: [facility];  Service: Endoscopy;  Laterality: N/A;  . CORONARY STENT PLACEMENT  01/19/2016   STENT SYNERGY DES 0.25E52 drug eluting stent was successfully placed, and overlaps the 2.5 x 38 mm Synergy stent placed distally.  . ESOPHAGOGASTRODUODENOSCOPY (EGD) WITH PROPOFOL N/A 04/09/2014   Procedure: ESOPHAGOGASTRODUODENOSCOPY (EGD) WITH PROPOFOL;  Surgeon: Cleotis Nipper, MD;  Location: [facility];  Service: Endoscopy;  Laterality: N/A;  . HEMORRHOID SURGERY    . HERNIA REPAIR    . KNEE ARTHROSCOPY       OB History   No obstetric history on file.     Family History  Problem Relation Age of Onset  . Allergies Mother   . Asthma Mother   . CAD Father 48  . Diabetes Father   . Hypertension Father   . Congestive Heart Failure Sister        died in her 49s  . CAD Sister 71    Social History   Tobacco Use  . Smoking status: Former Smoker    Packs/day: 1.00    Years: 30.00    Pack years: 30.00    Types: Cigarettes    Quit date: 01/09/2004    Years since quitting: 16.4  . Smokeless tobacco: Never Used  Vaping Use  . Vaping Use: Never used  Substance Use Topics  . Alcohol use: No    Alcohol/week: 0.0 standard drinks  . Drug use: No    Home Medications Prior to Admission medications   Medication Sig Start Date End Date Taking? Authorizing Provider  albuterol (PROAIR HFA) 108 (90 Base) MCG/ACT inhaler Inhale 2 puffs into the lungs every 4 (four) hours as needed for wheezing or shortness of breath. 04/08/19   Tanda Rockers, MD  albuterol (PROVENTIL) (2.5 MG/3ML) 0.083% nebulizer solution Take 2.5 mg by nebulization every 4 (four) hours as needed for wheezing or shortness of breath.     [provider]  ALPRAZolam Duanne Moron) 0.5 MG tablet Take 1 tablet (0.5 mg total) by mouth 2 (two) times daily as needed for anxiety. 07/31/15   Theodis Blaze, MD   aspirin EC 81 MG tablet Take 81 mg by mouth daily.     [provider]  atorvastatin (LIPITOR) 40 MG tablet Take 40 mg by mouth at bedtime.    [provider]  azithromycin (ZITHROMAX) 250 MG tablet Take 1 tablet (250 mg total) by mouth as directed. 05/24/20   Tanda Rockers, MD  Biotin 5000 MCG CAPS Take 1 capsule by mouth daily.    [provider]  budesonide (PULMICORT) 0.5 MG/2ML nebulizer solution Take 2 mLs (0.5 mg total) by nebulization in the morning and at bedtime. 01/13/20   Tanda Rockers, MD  chlorpheniramine (CHLOR-TRIMETON) 4 MG tablet Take 4 mg by mouth 2 (two) times daily as needed for allergies or rhinitis.     [provider]  chlorthalidone (HYGROTON) 25 MG tablet Take 1 tablet (25 mg total) by mouth daily. 02/10/20   Jerline Pain, MD  dextromethorphan (DELSYM) 30 MG/5ML liquid Take 60 mg by mouth daily as needed for cough. 2 tsp every 12 hours as needed w/ flutter    [provider]  diltiazem (CARTIA XT) 240 MG 24 hr capsule Take 1 capsule (240 mg total) by mouth daily. 02/17/20   Jerline Pain, MD  divalproex (DEPAKOTE ER) 500 MG 24 hr tablet Take 2 tablets (1,000 mg total) by mouth at bedtime. 12/08/19   Marcial Pacas, MD  esomeprazole (NEXIUM) 40 MG capsule Take 40 mg by mouth daily at 12 noon.    [provider]  famotidine (PEPCID) 20 MG tablet Take 1 tablet (20 mg total) by mouth daily. 04/30/20   Larene Pickett, PA-C  formoterol (PERFOROMIST) 20 MCG/2ML nebulizer solution Take 2 mLs (20 mcg total) by nebulization 2 (two) times daily. 03/30/20   Tanda Rockers, MD  hydrALAZINE (APRESOLINE) 50 MG tablet Take 1 tablet (50 mg total) by mouth 2 (two) times daily. 03/24/20   Jerline Pain, MD  latanoprost (XALATAN) 0.005 % ophthalmic solution  02/05/20   [provider]  LUMIGAN 0.01 % SOLN 1 drop at bedtime. 02/03/20   [provider]  memantine (NAMENDA) 10 MG tablet Take 1 tablet (10 mg total) by mouth 2  (two) times daily. 07/08/19   Suzzanne Cloud, NP  metFORMIN (GLUCOPHAGE) 500 MG tablet Take 500 mg by mouth 2 (two) times daily with a meal.    [provider]  montelukast (SINGULAIR) 10 MG tablet TAKE 1 TABLET BY MOUTH AT BEDTIME 02/13/20   Lauraine Rinne, NP  Multiple Vitamin (MULTIVITAMIN WITH MINERALS) TABS tablet Take 1 tablet by mouth daily.    [provider]  nebivolol (BYSTOLIC) 10 MG tablet Take 1 tablet (10 mg total) by mouth daily. 04/10/19   Jerline Pain, MD  nitroGLYCERIN (NITROSTAT) 0.4 MG SL tablet DISSOLVE ONE TABLET UNDER THE TONGUE EVERY 5 MINUTES AS NEEDED FOR CHEST PAIN.  DO NOT EXCEED A TOTAL OF 3 DOSES IN 15 MINUTES 11/19/19   Burtis Junes, NP  potassium chloride SA (K-DUR,KLOR-CON) 20 MEQ tablet Take 20 mEq by mouth daily.     [provider]  predniSONE (DELTASONE) 10 MG tablet 4 x 2 days, 2 x 2 days, 1 x 2 days, then stop 05/24/20   Tanda Rockers, MD  Respiratory Therapy Supplies (FLUTTER) DEVI by Does not apply route.    [provider]  sucralfate (CARAFATE) 1 g tablet Take 1 tablet (1 g total) by mouth 4 (four) times daily -  with meals and at bedtime. 04/30/20   Larene Pickett, PA-C  torsemide (DEMADEX) 20 MG tablet Take 20 mg by mouth daily. 10/25/16   [provider]  traMADol (ULTRAM) 50 MG tablet Take 50 mg by mouth every 4 (four) hours as needed. 02/13/20   [provider]    Allergies    Patient has no known allergies.  Review of Systems   Review of Systems  Constitutional: Negative for fever.  Respiratory: Positive for cough, shortness of breath and wheezing.   Cardiovascular: Negative for chest pain and leg swelling.  Gastrointestinal: Negative for abdominal pain, nausea and vomiting.  Genitourinary: Negative for dysuria.  All other systems reviewed and are negative.   Physical Exam Updated Vital Signs BP (!) 151/63   Pulse (!) 109   Temp 97.6 F (36.4 C) (Oral)   Resp 16   LMP  (LMP  Unknown)   SpO2 100%  Physical Exam Vitals and nursing note reviewed.  Constitutional:      Appearance: She is well-developed and well-nourished.     Comments: Ill-appearing but nontoxic  HENT:     Head: Normocephalic and atraumatic.     Mouth/Throat:     Mouth: Mucous membranes are moist.  Eyes:     Pupils: Pupils are equal, round, and reactive to light.  Cardiovascular:     Rate and Rhythm: Regular rhythm. Tachycardia present.     Heart sounds: Normal heart sounds.  Pulmonary:     Effort: Pulmonary effort is normal. No respiratory distress.     Breath sounds: Wheezing present.     Comments: Tachypnea, diffuse expiratory wheezing, poor air movement in all lung fields, speaking in short sentences Abdominal:     General: Bowel sounds are normal.     Palpations: Abdomen is soft.     Tenderness: There is no abdominal tenderness.  Musculoskeletal:     Cervical back: Neck supple.     Right lower leg: No edema.     Left lower leg: No edema.  Skin:    General: Skin is warm and dry.  Neurological:     Mental Status: She is alert and oriented to person, place, and time.  Psychiatric:        Mood and Affect: Mood and affect and mood normal.     ED Results / Procedures / Treatments   Labs (all labs ordered are listed, but only abnormal results are displayed) Labs Reviewed  CBC WITH DIFFERENTIAL/PLATELET - Abnormal; Notable for the following components:      Result Value   RBC 3.77 (*)    HCT 35.9 (*)    All other components within normal limits  RESP PANEL BY RT-PCR (FLU A&B, COVID) ARPGX2  BASIC METABOLIC PANEL  TROPONIN I (HIGH SENSITIVITY)    EKG EKG Interpretation  Date/Time:  Wednesday June 16 2020 07:14:22 EST Ventricular Rate:  134 PR Interval:    QRS Duration: 100 QT Interval:  330 QTC Calculation: 493 R Axis:   -23 Text Interpretation: Sinus or ectopic atrial tachycardia Ventricular premature complex Aberrant complex Borderline left axis deviation  Borderline ST depression, diffuse leads Borderline prolonged QT interval Confirmed by Thayer Jew [phone]) on 06/16/2020 7:18:45 AM   Radiology DG Chest Portable 1 View  Result Date: 06/16/2020 CLINICAL DATA:  Shortness of breath. EXAM: PORTABLE CHEST 1 VIEW COMPARISON:  04/30/2020. FINDINGS: Mediastinum and hilar structures normal. Lungs are clear. No pleural effusion or pneumothorax. Heart size normal. Degenerative changes scoliosis thoracic spine. IMPRESSION: No acute cardiopulmonary disease. Electronically Signed   By: Marcello Moores  Register   On: 06/16/2020 06:11    Procedures .Critical Care Performed by: Merryl Hacker, MD Authorized by: Merryl Hacker, MD   Critical care provider statement:    Critical care time (minutes):  45   Critical care was necessary to treat or prevent imminent or life-threatening deterioration of the following conditions:  Respiratory failure   Critical care was time spent personally by me on the following activities:  Discussions with consultants, evaluation of patient's response to treatment, examination of patient, ordering and performing treatments and interventions, ordering and review of laboratory studies, ordering and review of radiographic studies, pulse oximetry, re-evaluation of patient's condition, obtaining history from patient or surrogate and review of old charts     Medications Ordered in ED Medications  albuterol (VENTOLIN HFA) 108 (90 Base) MCG/ACT inhaler 2 puff (has no administration in time range)  magnesium  sulfate IVPB 2 g 50 mL (has no administration in time range)  albuterol (PROVENTIL,VENTOLIN) solution continuous neb (15 mg/hr Nebulization Given 06/16/20 0557)  dexamethasone (DECADRON) injection 10 mg (10 mg Intravenous Given 06/16/20 5520)    ED Course  I have reviewed the triage vital signs and the nursing notes.  Pertinent labs & imaging results that were available during my care of the patient were reviewed by me and  considered in my medical decision making (see chart for details).  Clinical Course as of 06/16/20 0720  Wed Jun 16, 2020  0717 On recheck, patient continues to have significant wheezing and tachypnea following continuous neb.  IV magnesium ordered.  Dr. Karle Starch is at the bedside for signout.  Plan for admission to the hospital.  Will likely need recurrent nebulizer.  We will give her a short break given tachycardia into the 130s. [CH]  [id number] Patient now complaining of worsening chest discomfort with breathing.  EKG shows diffuse ST changes but no ST elevation.  Appears demand related given her tachycardia.  We will add a troponin. [CH]    Clinical Course User Index [CH] Yi Falletta, Barbette Hair, MD   MDM Rules/Calculators/A&P                          Patient presents with wheezing and shortness of breath.  Recent trial of azithromycin and prednisone per chart review.  She is significantly wheezing on exam with increased work of breathing.  She is not hypoxic.  Will give a continuous albuterol.  Considerations include asthma exacerbation, URI, COVID-19 although she reports negative Covid testing.  She does not appear volume overloaded.  Doubt CHF.  reports some chest discomfort only with coughing.  Chest x-ray without evidence of pneumonia or pneumothorax.  See clinical course above.  Patient continues to have significant wheezing and increased respiratory rate.  IV magnesium ordered.  Lab work still pending.  Covid testing pending.  Patient will likely need admission to the hospital for ongoing respiratory distress.  Final Clinical Impression(s) / ED Diagnoses Final diagnoses:  Severe asthma with exacerbation, unspecified whether persistent    Rx / DC Orders ED Discharge Orders    None       Jessee Newnam, Barbette Hair, MD 06/16/20 [phone]

## 2020-06-16 NOTE — ED Triage Notes (Signed)
Pt c/o asthma exacerbation that's been "off & on for weeks," states that home tx, PCP prescribed abx, OTC medication did not help. Was covid tested, negative result. States cough/wheeze has gotten worse, endorses CP from cough. Pt audibly wheezing in triage, cannot speak in full sentences without becoming winded. SpO2 100%, RR even but labored.

## 2020-06-16 NOTE — ED Provider Notes (Signed)
Care of the patient assumed at the change of shift. Here with SOB, wheezing, history of asthma followed by pulmonology and recently completed a course of steroids and Abx. Still wheezing after 15mg  neb and decadron. Getting Magnesium next, plan admission Physical Exam  BP (!) 151/63   Pulse (!) 109   Temp 97.6 F (36.4 C) (Oral)   Resp 16   LMP  (LMP Unknown)   SpO2 100%   Physical Exam Wheezing Tachycardic  ED Course/Procedures   Clinical Course as of 06/16/20 0812  Wed Jun 16, 2020  0717 On recheck, patient continues to have significant wheezing and tachypnea following continuous neb.  IV magnesium ordered.  Dr. Karle Starch is at the bedside for signout.  Plan for admission to the hospital.  Will likely need recurrent nebulizer.  We will give her a short break given tachycardia into the 130s. [CH]  [id number] Patient now complaining of worsening chest discomfort with breathing.  EKG shows diffuse ST changes but no ST elevation.  Appears demand related given her tachycardia.  We will add a troponin. [CH]    Clinical Course User Index [CH] Horton, Barbette Hair, MD    Procedures  MDM  Patient's Covid is also positive, she is not febrile, hypoxic and CXR is clear but could be a trigger for asthma exacerbation. Will discuss with Hospitalist for admission.   [place] with Dr. Lorin Mercy, Hospitalist, who will evaluate the patient.     Truddie Hidden, MD 06/16/20 [phone]

## 2020-06-17 DIAGNOSIS — U071 COVID-19: Secondary | ICD-10-CM | POA: Diagnosis not present

## 2020-06-17 DIAGNOSIS — I10 Essential (primary) hypertension: Secondary | ICD-10-CM

## 2020-06-17 DIAGNOSIS — J45901 Unspecified asthma with (acute) exacerbation: Secondary | ICD-10-CM | POA: Diagnosis not present

## 2020-06-17 DIAGNOSIS — I5032 Chronic diastolic (congestive) heart failure: Secondary | ICD-10-CM

## 2020-06-17 DIAGNOSIS — J069 Acute upper respiratory infection, unspecified: Secondary | ICD-10-CM

## 2020-06-17 DIAGNOSIS — E119 Type 2 diabetes mellitus without complications: Secondary | ICD-10-CM | POA: Diagnosis not present

## 2020-06-17 LAB — CBC WITH DIFFERENTIAL/PLATELET
Abs Immature Granulocytes: 0.02 10*3/uL (ref 0.00–0.07)
Basophils Absolute: 0 10*3/uL (ref 0.0–0.1)
Basophils Relative: 0 %
Eosinophils Absolute: 0 10*3/uL (ref 0.0–0.5)
Eosinophils Relative: 0 %
HCT: 30.3 % — ABNORMAL LOW (ref 36.0–46.0)
Hemoglobin: 10.7 g/dL — ABNORMAL LOW (ref 12.0–15.0)
Immature Granulocytes: 0 %
Lymphocytes Relative: 22 %
Lymphs Abs: 1.2 10*3/uL (ref 0.7–4.0)
MCH: 32.9 pg (ref 26.0–34.0)
MCHC: 35.3 g/dL (ref 30.0–36.0)
MCV: 93.2 fL (ref 80.0–100.0)
Monocytes Absolute: 0.5 10*3/uL (ref 0.1–1.0)
Monocytes Relative: 8 %
Neutro Abs: 3.8 10*3/uL (ref 1.7–7.7)
Neutrophils Relative %: 70 %
Platelets: 223 10*3/uL (ref 150–400)
RBC: 3.25 MIL/uL — ABNORMAL LOW (ref 3.87–5.11)
RDW: 13.9 % (ref 11.5–15.5)
WBC: 5.5 10*3/uL (ref 4.0–10.5)
nRBC: 0 % (ref 0.0–0.2)

## 2020-06-17 LAB — COMPREHENSIVE METABOLIC PANEL
ALT: 18 U/L (ref 0–44)
AST: 20 U/L (ref 15–41)
Albumin: 3.5 g/dL (ref 3.5–5.0)
Alkaline Phosphatase: 48 U/L (ref 38–126)
Anion gap: 13 (ref 5–15)
BUN: 35 mg/dL — ABNORMAL HIGH (ref 8–23)
CO2: 23 mmol/L (ref 22–32)
Calcium: 9.4 mg/dL (ref 8.9–10.3)
Chloride: 102 mmol/L (ref 98–111)
Creatinine, Ser: 1.43 mg/dL — ABNORMAL HIGH (ref 0.44–1.00)
GFR, Estimated: 39 mL/min — ABNORMAL LOW (ref >60)
Glucose, Bld: 160 mg/dL — ABNORMAL HIGH (ref 70–99)
Potassium: 4 mmol/L (ref 3.5–5.1)
Sodium: 138 mmol/L (ref 135–145)
Total Bilirubin: 0.7 mg/dL (ref 0.3–1.2)
Total Protein: 6.9 g/dL (ref 6.5–8.1)

## 2020-06-17 LAB — D-DIMER, QUANTITATIVE: D-Dimer, Quant: <0.27 ug/mL-FEU (ref 0.00–0.50)

## 2020-06-17 LAB — PHOSPHORUS: Phosphorus: 2.2 mg/dL — ABNORMAL LOW (ref 2.5–4.6)

## 2020-06-17 LAB — GLUCOSE, CAPILLARY
Glucose-Capillary: 145 mg/dL — ABNORMAL HIGH (ref 70–99)
Glucose-Capillary: 185 mg/dL — ABNORMAL HIGH (ref 70–99)
Glucose-Capillary: 163 mg/dL — ABNORMAL HIGH (ref 70–99)
Glucose-Capillary: 186 mg/dL — ABNORMAL HIGH (ref 70–99)

## 2020-06-17 LAB — C-REACTIVE PROTEIN: CRP: 0.6 mg/dL (ref <1.0)

## 2020-06-17 LAB — FERRITIN: Ferritin: 97 ng/mL (ref 11–307)

## 2020-06-17 LAB — MAGNESIUM: Magnesium: 2.1 mg/dL (ref 1.7–2.4)

## 2020-06-17 MED ORDER — METHYLPREDNISOLONE SODIUM SUCC 40 MG IJ SOLR
0.5000 mg/kg | Freq: Two times a day (BID) | INTRAMUSCULAR | Status: DC
  Administered 2020-06-17 – 2020-06-19 (×5): 34 mg via INTRAVENOUS
  Filled 2020-06-17 (×5): qty 1

## 2020-06-17 MED ORDER — ALBUTEROL SULFATE HFA 108 (90 BASE) MCG/ACT IN AERS
2.0000 | INHALATION_SPRAY | RESPIRATORY_TRACT | Status: DC | PRN
  Filled 2020-06-17: qty 6.7

## 2020-06-17 MED ORDER — PREDNISONE 5 MG PO TABS: 50.0000 mg | ORAL_TABLET | Freq: Every day | ORAL | Status: DC

## 2020-06-17 MED ORDER — LATANOPROST 0.005 % OP SOLN
1.0000 [drp] | Freq: Every day | OPHTHALMIC | Status: DC
  Administered 2020-06-17 – 2020-06-18 (×2): 1 [drp] via OPHTHALMIC
  Filled 2020-06-17: qty 2.5

## 2020-06-17 MED ORDER — ALBUTEROL SULFATE HFA 108 (90 BASE) MCG/ACT IN AERS
4.0000 | INHALATION_SPRAY | Freq: Four times a day (QID) | RESPIRATORY_TRACT | Status: DC
  Administered 2020-06-17 – 2020-06-18 (×5): 4 via RESPIRATORY_TRACT
  Filled 2020-06-17: qty 6.7

## 2020-06-17 NOTE — Progress Notes (Signed)
PROGRESS NOTE                                                                                                                                                                                                             Patient Demographics:    [name], is a 70 y.o. female, DOB - [birth year], EXH:[id number]  Outpatient Primary MD for the patient is [name], MD   Admit date - 06/16/2020   LOS - 1  Chief Complaint  Patient presents with  . Asthma       Brief Narrative: Patient is a 70 y.o. female with PMHx of CAD, HLD, DM, chronic diastolic heart failure, asthma-presenting with shortness of breath-thought to have COPD exacerbation due to COVID-19 infection.  See below for further details  COVID-19 vaccinated status: Vaccinated including booster  Significant Events: 2/16>> Admit to [facility] for asthma exacerbation  Significant studies: 2/16>>Chest x-ray: No pneumonia  COVID-19 medications: Steroids: 2/16>> Remdesivir: 2/16>>  Antibiotics: None  Microbiology data: None  Procedures: None  Consults: None  DVT prophylaxis: enoxaparin (LOVENOX) injection 40 mg Start: 06/16/20 1115   Subjective:    [name] today although better-still not yet at baseline-wheezing.   Assessment  & Plan :   Asthma exacerbation: Although improved-not yet at baseline-still wheezing-and feels tight.  Continue steroids-increase dosage of albuterol inhaler-reassess tomorrow.  COVID-19 infection: Fully vaccinated/boosted-suspect 3-day course of Remdesivir may suffice.  AKI on CKD stage IIIa: AKI hemodynamically mediated-back to baseline.  Chronic diastolic heart failure: Euvolemic on exam  CAD: No anginal symptoms  HLD: Lipitor  HTN: BP stable-continue diltiazem, hydralazine and Bystolic  DM-2: CBG stable on SSI  Recent Labs    06/16/20 1908 06/16/20 2157 06/17/20 0734  GLUCAP 197* 169* 145*    Seizure  disorder: Continue Depakote  ?Dementia/anxiety: Stable-continue Xanax/Namenda  ABG:    Component Value Date/Time   PHART 7.382 08/12/2017 0550   PCO2ART 45.0 08/12/2017 0550   PO2ART 163.0 (H) 08/12/2017 0550   HCO3 28.6 (H) 03/15/2018 1100   TCO2 30 03/15/2018 1100   ACIDBASEDEF 1.0 12/19/2017 1820   O2SAT 71.0 03/15/2018 1100    Vent Settings: N/A  Condition - Stable  Family Communication  : Spouse-Daniel-[phone] updated over the phone on 2/17  Code Status :  Full Code  Diet :  Diet Order  Diet Carb Modified Fluid consistency: Thin; Room service appropriate? Yes  Diet effective now                  Disposition Plan  :   Status is: Inpatient  Remains inpatient appropriate because:Inpatient level of care appropriate due to severity of illness   Dispo: The patient is from: Home              Anticipated d/c is to: Home              Anticipated d/c date is: 2 days              Patient currently is not medically stable to d/c.   Difficult to place patient No   Barriers to discharge: Asthma exacerbation-on IV steroids-not yet at baseline-Covid infection requiring 3 days of IV Remdesivir  Antimicorbials  :    Anti-infectives (From admission, onward)   Start     Dose/Rate Route Frequency Ordered Stop   06/17/20 1000  remdesivir 100 mg in sodium chloride 0.9 % 100 mL IVPB       "Followed by" Linked Group Details   100 mg 200 mL/hr over 30 Minutes Intravenous Daily 06/16/20 1101 06/21/20 0959   06/16/20 1115  remdesivir 200 mg in sodium chloride 0.9% 250 mL IVPB       "Followed by" Linked Group Details   200 mg 580 mL/hr over 30 Minutes Intravenous Once 06/16/20 1101 06/16/20 1455      Inpatient Medications  Scheduled Meds: . albuterol  4 puff Inhalation Q6H  . vitamin C  500 mg Oral Daily  . atorvastatin  40 mg Oral QHS  . diltiazem  240 mg Oral Daily  . divalproex  1,000 mg Oral QHS  . enoxaparin (LOVENOX) injection  40 mg Subcutaneous  Q24H  . hydrALAZINE  50 mg Oral BID  . insulin aspart  0-15 Units Subcutaneous TID WC  . insulin aspart  0-5 Units Subcutaneous QHS  . ipratropium  2 puff Inhalation Q6H  . memantine  10 mg Oral BID  . methylPREDNISolone (SOLU-MEDROL) injection  0.5 mg/kg Intravenous Q12H   Followed by  . [START ON 06/20/2020] predniSONE  50 mg Oral Daily  . montelukast  10 mg Oral QHS  . nebivolol  10 mg Oral Daily  . pantoprazole  40 mg Oral BID AC  . potassium chloride SA  20 mEq Oral Daily  . sodium chloride flush  3 mL Intravenous Q12H  . sodium chloride flush  3 mL Intravenous Q12H  . zinc sulfate  220 mg Oral Daily   Continuous Infusions: . sodium chloride    . remdesivir 100 mg in NS 100 mL     PRN Meds:.sodium chloride, acetaminophen, albuterol, ALPRAZolam, bisacodyl, chlorpheniramine-HYDROcodone, guaiFENesin-dextromethorphan, hydrALAZINE, ondansetron **OR** ondansetron (ZOFRAN) IV, oxyCODONE, polyethylene glycol, sodium chloride flush, sodium phosphate   Time Spent in minutes  25  See all Orders from today for further details   Oren Binet M.D on 06/17/2020 at 10:28 AM  To page go to [email or web address] - use universal password  [facility] -  Office  [phone]    Objective:   Vitals:   06/16/20 2155 06/16/20 2337 06/17/20 0400 06/17/20 0730  BP: (!) 130/53 126/71 128/71 132/73  Pulse: 82 83 75   Resp: 14 15 (!) 21   Temp: (!) 96.8 F (36 C) (!) 97 F (36.1 C) (!) 97.5 F (36.4 C) 97.9 F (36.6 C)  TempSrc: Oral Oral Oral Oral  SpO2: 98% 95% 95%   Weight:      Height:        Wt Readings from Last 3 Encounters:  06/16/20 63.3 kg  05/31/20 65.8 kg  05/11/20 65.8 kg     Intake/Output Summary (Last 24 hours) at 06/17/2020 1028 Last data filed at 06/17/2020 0602 Gross per 24 hour  Intake 480 ml  Output --  Net 480 ml     Physical Exam Gen Exam:Alert awake-not in any distress HEENT:atraumatic, normocephalic Chest: Moving air but with diffuse rhonchi all  over. CVS:S1S2 regular Abdomen:soft non tender, non distended Extremities:no edema Neurology: Non focal Skin: no rash   Data Review:    CBC Recent Labs  Lab 06/16/20 0615 06/17/20 0110  WBC 7.1 5.5  HGB 12.2 10.7*  HCT 35.9* 30.3*  PLT 220 223  MCV 95.2 93.2  MCH 32.4 32.9  MCHC 34.0 35.3  RDW 14.0 13.9  LYMPHSABS 3.6 1.2  MONOABS 0.6 0.5  EOSABS 0.0 0.0  BASOSABS 0.0 0.0    Chemistries  Recent Labs  Lab 06/16/20 0615 06/17/20 0110  NA 140 138  K 2.7* 4.0  CL 98 102  CO2 28 23  GLUCOSE 150* 160*  BUN 40* 35*  CREATININE 1.74* 1.43*  CALCIUM 9.4 9.4  MG  --  2.1  AST  --  20  ALT  --  18  ALKPHOS  --  48  BILITOT  --  0.7   ------------------------------------------------------------------------------------------------------------------ No results for input(s): CHOL, HDL, LDLCALC, TRIG, CHOLHDL, LDLDIRECT in the last 72 hours.  Lab Results  Component Value Date   HGBA1C 6.4 (H) 06/16/2020   ------------------------------------------------------------------------------------------------------------------ No results for input(s): TSH, T4TOTAL, T3FREE, THYROIDAB in the last 72 hours.  Invalid input(s): FREET3 ------------------------------------------------------------------------------------------------------------------ Recent Labs    06/16/20 1248 06/17/20 0110  FERRITIN 108 97    Coagulation profile No results for input(s): INR, PROTIME in the last 168 hours.  Recent Labs    06/16/20 1248 06/17/20 0110  DDIMER 0.31 <0.27    Cardiac Enzymes No results for input(s): CKMB, TROPONINI, MYOGLOBIN in the last 168 hours.  Invalid input(s): CK ------------------------------------------------------------------------------------------------------------------    Component Value Date/Time   BNP 129.2 (H) 03/16/2018 0924   BNP <4.0 01/26/2016 1608    Micro Results Recent Results (from the past 240 hour(s))  Resp Panel by RT-PCR (Flu A&B,  Covid) Nasopharyngeal Swab     Status: Abnormal   Collection Time: 06/16/20  5:50 AM   Specimen: Nasopharyngeal Swab; Nasopharyngeal(NP) swabs in vial transport medium  Result Value Ref Range Status   SARS Coronavirus 2 by RT PCR POSITIVE (A) NEGATIVE Final    Comment: RESULT CALLED TO, READ BACK BY AND VERIFIED WITH: RN C GRONE [phone] AT 725 AM BY CM (NOTE) SARS-CoV-2 target nucleic acids are DETECTED.  The SARS-CoV-2 RNA is generally detectable in upper respiratory specimens during the acute phase of infection. Positive results are indicative of the presence of the identified virus, but do not rule out bacterial infection or co-infection with other pathogens not detected by the test. Clinical correlation with patient history and other diagnostic information is necessary to determine patient infection status. The expected result is Negative.  Fact Sheet for Patients: EntrepreneurPulse.com.au  Fact Sheet for Healthcare Providers: IncredibleEmployment.be  This test is not yet approved or cleared by the [place] FDA and  has been authorized for detection and/or diagnosis of SARS-CoV-2 by FDA under an Emergency Use Authorization (EUA).  This EUA will remain in effect (meaning  this test can be  used) for the duration of  the COVID-19 declaration under Section 564(b)(1) of the Act, 21 U.S.C. section 360bbb-3(b)(1), unless the authorization is terminated or revoked sooner.     Influenza A by PCR NEGATIVE NEGATIVE Final   Influenza B by PCR NEGATIVE NEGATIVE Final    Comment: (NOTE) The Xpert Xpress SARS-CoV-2/FLU/RSV plus assay is intended as an aid in the diagnosis of influenza from Nasopharyngeal swab specimens and should not be used as a sole basis for treatment. Nasal washings and aspirates are unacceptable for Xpert Xpress SARS-CoV-2/FLU/RSV testing.  Fact Sheet for Patients: EntrepreneurPulse.com.au  Fact Sheet for  Healthcare Providers: IncredibleEmployment.be  This test is not yet approved or cleared by the [place] FDA and has been authorized for detection and/or diagnosis of SARS-CoV-2 by FDA under an Emergency Use Authorization (EUA). This EUA will remain in effect (meaning this test can be used) for the duration of the COVID-19 declaration under Section 564(b)(1) of the Act, 21 U.S.C. section 360bbb-3(b)(1), unless the authorization is terminated or revoked.  Performed at [facility], [place] [address], [place], [place] [address]     Radiology Reports DG Chest Portable 1 View  Result Date: 06/16/2020 CLINICAL DATA:  Shortness of breath. EXAM: PORTABLE CHEST 1 VIEW COMPARISON:  04/30/2020. FINDINGS: Mediastinum and hilar structures normal. Lungs are clear. No pleural effusion or pneumothorax. Heart size normal. Degenerative changes scoliosis thoracic spine. IMPRESSION: No acute cardiopulmonary disease. Electronically Signed   By: Marcello Moores  Register   On: 06/16/2020 06:11   MYOCARDIAL PERFUSION IMAGING  Result Date: 05/31/2020  Nuclear stress EF: 75%.  There was no ST segment deviation noted during stress.  The study is normal.  This is a low risk study.  The left ventricular ejection fraction is hyperdynamic (>65%).  There is significant bowel radioisotope uptake in the vicinity of the inferior LV wall, without evidence of perfusion defect. No ischemia or infarction on perfusion imaging.

## 2020-06-17 NOTE — Evaluation (Signed)
Physical Therapy Evaluation & Discharge Patient Details Name: LACONDA BASICH MRN: [id number] DOB: 09-08-1950 Today's Date: 06/17/2020   History of Present Illness  Pt is a 70 y.o. female admitted 06/16/20 with wheezing, SOB; workup for (+) COVID-19, asthma exacerbation, AKI on CKD. CXR negative for acute disease. PMH includes asthma, CHF, CAD, DM, HTN, bronchitis, anxiety, depression.    Clinical Impression  Patient evaluated by Physical Therapy with no further acute PT needs identified. PTA, pt independent, lives with family, retired from work due to h/o asthma. Today, pt independent with mobility and ADLs. SpO2 91-99% on RA (when pleth reliable). Educ re: activity recommendations, therex, incentive spirometer use, importance of mobility (including hallway ambulation while admitted). All education has been completed and the patient has no further questions. Acute PT is signing off. Thank you for this referral.    Follow Up Recommendations No PT follow up    Equipment Recommendations  None recommended by PT    Recommendations for Other Services       Precautions / Restrictions Precautions Precautions: Other (comment) Precaution Comments: Asthma exacerbation w/ wheezing Restrictions Weight Bearing Restrictions: No      Mobility  Bed Mobility Overal bed mobility: Modified Independent             General bed mobility comments: HOB elevated    Transfers Overall transfer level: Independent Equipment used: None                Ambulation/Gait Ambulation/Gait assistance: Independent Gait Distance (Feet): 600 Feet Assistive device: None Gait Pattern/deviations: WFL(Within Functional Limits) Gait velocity: Decreased   General Gait Details: Slow, steady gait independent without DME; intermittent wheezing and SOB noted, 1x standing rest break due to SOB; pt with good awareness of activity tolerance and need for rest  Stairs            Wheelchair Mobility     Modified Rankin (Stroke Patients Only)       Balance Overall balance assessment: No apparent balance deficits (not formally assessed)                                           Pertinent Vitals/Pain Pain Assessment: Faces Faces Pain Scale: Hurts little more Pain Location: Back/chest "from coughing" Pain Descriptors / Indicators: Discomfort Pain Intervention(s): Monitored during session    Home Living Family/patient expects to be discharged to:: Private residence Living Arrangements: Spouse/significant other;Children;Other relatives Available Help at Discharge: Family;Available 24 hours/day Type of Home: House Home Access: Stairs to enter Entrance Stairs-Rails: Right Entrance Stairs-Number of Steps: 3 Home Layout: One level Home Equipment: Environmental consultant - 2 wheels Additional Comments: Lives with husband, grandchildren and great-grandchildren (39 and 82 y.o.)    Prior Function Level of Independence: Independent         Comments: Independent without DME. Drives. Stopped working due to [employer] - MD does not want her working due to asthma (retired Scientist, research (medical) work).     Hand Dominance   Dominant Hand: Left    Extremity/Trunk Assessment   Upper Extremity Assessment Upper Extremity Assessment: Overall WFL for tasks assessed    Lower Extremity Assessment Lower Extremity Assessment: Overall WFL for tasks assessed       Communication   Communication: No difficulties  Cognition Arousal/Alertness: Awake/alert Behavior During Therapy: WFL for tasks assessed/performed Overall Cognitive Status: Within Functional Limits for tasks assessed  General Comments General comments (skin integrity, edema, etc.): SpO2 91-99% on RA when pleth reliable. Educ on activity recommendations (including seated/standing therex and hallway ambulation while admitted), energy conservation strategies (pt declined handout, reports having  info from prior admission)    Exercises Other Exercises Other Exercises: IS x10 (pulling 1000-1250 mL with good technique)   Assessment/Plan    PT Assessment Patent does not need any further PT services  PT Problem List         PT Treatment Interventions      PT Goals (Current goals can be found in the Care Plan section)  Acute Rehab PT Goals PT Goal Formulation: All assessment and education complete, DC therapy    Frequency     Barriers to discharge        Co-evaluation               AM-PAC PT "6 Clicks" Mobility  Outcome Measure Help needed turning from your back to your side while in a flat bed without using bedrails?: None Help needed moving from lying on your back to sitting on the side of a flat bed without using bedrails?: None Help needed moving to and from a bed to a chair (including a wheelchair)?: None Help needed standing up from a chair using your arms (e.g., wheelchair or bedside chair)?: None Help needed to walk in hospital room?: None Help needed climbing 3-5 steps with a railing? : None 6 Click Score: 24    End of Session   Activity Tolerance: Patient tolerated treatment well Patient left: in chair;with call bell/phone within reach Nurse Communication: Mobility status PT Visit Diagnosis: Other abnormalities of gait and mobility (R26.89)    Time: 1610-9604 PT Time Calculation (min) (ACUTE ONLY): 21 min   Charges:   PT Evaluation $PT Eval Moderate Complexity: [place], PT, DPT Acute Rehabilitation Services  Pager [phone] Office [place] 06/17/2020, 10:48 AM

## 2020-06-17 NOTE — Progress Notes (Signed)
OT Cancellation Note  Patient Details Name: [name] MRN: [id number] DOB: 1951/02/20   Cancelled Treatment:    Reason Eval/Treat Not Completed: OT screened, no needs identified, will sign off.  Per PT pt close to baseline.    Nilsa Nutting., OTR/L Acute Rehabilitation Services Pager [phone] Office [phone]   Lucille Passy M 06/17/2020, 10:58 AM

## 2020-06-18 DIAGNOSIS — E119 Type 2 diabetes mellitus without complications: Secondary | ICD-10-CM | POA: Diagnosis not present

## 2020-06-18 DIAGNOSIS — J45901 Unspecified asthma with (acute) exacerbation: Secondary | ICD-10-CM | POA: Diagnosis not present

## 2020-06-18 DIAGNOSIS — U071 COVID-19: Secondary | ICD-10-CM | POA: Diagnosis not present

## 2020-06-18 DIAGNOSIS — I5032 Chronic diastolic (congestive) heart failure: Secondary | ICD-10-CM | POA: Diagnosis not present

## 2020-06-18 LAB — CBC WITH DIFFERENTIAL/PLATELET
Abs Immature Granulocytes: 0.09 10*3/uL — ABNORMAL HIGH (ref 0.00–0.07)
Basophils Absolute: 0 10*3/uL (ref 0.0–0.1)
Basophils Relative: 0 %
Eosinophils Absolute: 0 10*3/uL (ref 0.0–0.5)
Eosinophils Relative: 0 %
HCT: 32.3 % — ABNORMAL LOW (ref 36.0–46.0)
Hemoglobin: 11 g/dL — ABNORMAL LOW (ref 12.0–15.0)
Immature Granulocytes: 1 %
Lymphocytes Relative: 11 %
Lymphs Abs: 1.3 10*3/uL (ref 0.7–4.0)
MCH: 32.4 pg (ref 26.0–34.0)
MCHC: 34.1 g/dL (ref 30.0–36.0)
MCV: 95.3 fL (ref 80.0–100.0)
Monocytes Absolute: 0.3 10*3/uL (ref 0.1–1.0)
Monocytes Relative: 3 %
Neutro Abs: 9.9 10*3/uL — ABNORMAL HIGH (ref 1.7–7.7)
Neutrophils Relative %: 85 %
Platelets: 240 10*3/uL (ref 150–400)
RBC: 3.39 MIL/uL — ABNORMAL LOW (ref 3.87–5.11)
RDW: 13.9 % (ref 11.5–15.5)
WBC: 11.6 10*3/uL — ABNORMAL HIGH (ref 4.0–10.5)
nRBC: 0 % (ref 0.0–0.2)

## 2020-06-18 LAB — GLUCOSE, CAPILLARY
Glucose-Capillary: 199 mg/dL — ABNORMAL HIGH (ref 70–99)
Glucose-Capillary: 172 mg/dL — ABNORMAL HIGH (ref 70–99)
Glucose-Capillary: 262 mg/dL — ABNORMAL HIGH (ref 70–99)
Glucose-Capillary: 193 mg/dL — ABNORMAL HIGH (ref 70–99)

## 2020-06-18 LAB — C-REACTIVE PROTEIN: CRP: 0.7 mg/dL

## 2020-06-18 LAB — COMPREHENSIVE METABOLIC PANEL
ALT: 19 U/L (ref 0–44)
AST: 27 U/L (ref 15–41)
Albumin: 3.7 g/dL (ref 3.5–5.0)
Alkaline Phosphatase: 49 U/L (ref 38–126)
Anion gap: 14 (ref 5–15)
BUN: 42 mg/dL — ABNORMAL HIGH (ref 8–23)
CO2: 21 mmol/L — ABNORMAL LOW (ref 22–32)
Calcium: 9.4 mg/dL (ref 8.9–10.3)
Chloride: 100 mmol/L (ref 98–111)
Creatinine, Ser: 1.76 mg/dL — ABNORMAL HIGH (ref 0.44–1.00)
GFR, Estimated: 31 mL/min — ABNORMAL LOW (ref >60)
Glucose, Bld: 223 mg/dL — ABNORMAL HIGH (ref 70–99)
Potassium: 3.9 mmol/L (ref 3.5–5.1)
Sodium: 135 mmol/L (ref 135–145)
Total Bilirubin: 0.6 mg/dL (ref 0.3–1.2)
Total Protein: 7.1 g/dL (ref 6.5–8.1)

## 2020-06-18 LAB — D-DIMER, QUANTITATIVE: D-Dimer, Quant: 0.3 ug{FEU}/mL (ref 0.00–0.50)

## 2020-06-18 LAB — FERRITIN: Ferritin: 107 ng/mL (ref 11–307)

## 2020-06-18 LAB — PHOSPHORUS: Phosphorus: 2.9 mg/dL (ref 2.5–4.6)

## 2020-06-18 LAB — MAGNESIUM: Magnesium: 2.3 mg/dL (ref 1.7–2.4)

## 2020-06-18 MED ORDER — AZITHROMYCIN 500 MG PO TABS
500.0000 mg | ORAL_TABLET | Freq: Every day | ORAL | Status: DC
  Administered 2020-06-18 – 2020-06-19 (×2): 500 mg via ORAL
  Filled 2020-06-18 (×2): qty 1

## 2020-06-18 MED ORDER — BENZONATATE 100 MG PO CAPS
200.0000 mg | ORAL_CAPSULE | Freq: Three times a day (TID) | ORAL | Status: DC
  Administered 2020-06-18 – 2020-06-19 (×4): 200 mg via ORAL
  Filled 2020-06-18 (×4): qty 2

## 2020-06-18 MED ORDER — FAMOTIDINE 20 MG PO TABS
20.0000 mg | ORAL_TABLET | Freq: Every day | ORAL | Status: DC
  Administered 2020-06-18 – 2020-06-19 (×2): 20 mg via ORAL
  Filled 2020-06-18 (×2): qty 1

## 2020-06-18 MED ORDER — ALBUTEROL SULFATE HFA 108 (90 BASE) MCG/ACT IN AERS
6.0000 | INHALATION_SPRAY | Freq: Four times a day (QID) | RESPIRATORY_TRACT | Status: DC
  Administered 2020-06-18 – 2020-06-19 (×5): 6 via RESPIRATORY_TRACT

## 2020-06-18 MED ORDER — ENOXAPARIN SODIUM 30 MG/0.3ML ~~LOC~~ SOLN
30.0000 mg | SUBCUTANEOUS | Status: DC
  Administered 2020-06-19: 30 mg via SUBCUTANEOUS
  Filled 2020-06-18: qty 0.3

## 2020-06-18 NOTE — Progress Notes (Signed)
PROGRESS NOTE                                                                                                                                                                                                             Patient Demographics:    [name], is a 70 y.o. female, DOB - 1951/03/26, IWL:[id number]  Outpatient Primary MD for the patient is [name], MD   Admit date - 06/16/2020   LOS - 2  Chief Complaint  Patient presents with  . Asthma       Brief Narrative: Patient is a 70 y.o. female with PMHx of CAD, HLD, DM, chronic diastolic heart failure, asthma-presenting with shortness of breath-thought to have COPD exacerbation due to COVID-19 infection.  See below for further details  COVID-19 vaccinated status: Vaccinated including booster  Significant Events: 2/16>> Admit to [facility] for asthma exacerbation  Significant studies: 2/16>>Chest x-ray: No pneumonia  COVID-19 medications: Steroids: 2/16>> Remdesivir: 2/16>>2/18  Antibiotics: Zithromax:2/18>>  Microbiology data: None  Procedures: None  Consults: None  DVT prophylaxis: enoxaparin (LOVENOX) injection 40 mg Start: 06/16/20 1115   Subjective:   Complains of reflux symptoms-apparently her gastroenterologist wanted a barium study.  She has a history of a severe stricture requiring dilatation.  Still wheezing   Assessment  & Plan :   Asthma exacerbation: Slowly improving still wheezing-not sure if she has upper airway syndrome as well-claims she has significant GERD symptoms.  Increase albuterol inhaler to 6 puffs every 6 hours-continue steroids.  Add empiric Zithromax x3 days.  Follow closely.  COVID-19 infection: Fully vaccinated/boosted-suspect 3-day course of Remdesivir may suffice.  GERD: Continues to have reflux symptoms-already on PPI-add Pepcid.  Apparently has had dysphagia in the past requiring EGD with dilatations.  She  claims that her gastroenterologist wanted a barium esophagogram-we will go ahead and order the study.  AKI on CKD stage IIIa: AKI hemodynamically mediated-back to baseline.  Chronic diastolic heart failure: Euvolemic on exam  CAD: No anginal symptoms  HLD: Lipitor  HTN: BP stable-continue diltiazem, hydralazine and Bystolic  DM-2: CBG stable on SSI  Recent Labs    06/17/20 2045 06/18/20 0746 06/18/20 1206  GLUCAP 186* 199* 172*    Seizure disorder: Continue Depakote  ?Dementia/anxiety: Stable-continue Xanax/Namenda  ABG:    Component Value Date/Time   PHART 7.382 08/12/2017 0550  PCO2ART 45.0 08/12/2017 0550   PO2ART 163.0 (H) 08/12/2017 0550   HCO3 28.6 (H) 03/15/2018 1100   TCO2 30 03/15/2018 1100   ACIDBASEDEF 1.0 12/19/2017 1820   O2SAT 71.0 03/15/2018 1100    Vent Settings: N/A  Condition - Stable  Family Communication  : Spouse-[phone] updated over the phone on 2/18  Code Status :  Full Code  Diet :  Diet Order            Diet Carb Modified Fluid consistency: Thin; Room service appropriate? Yes  Diet effective now                  Disposition Plan  :   Status is: Inpatient  Remains inpatient appropriate because:Inpatient level of care appropriate due to severity of illness   Dispo: The patient is from: Home              Anticipated d/c is to: Home              Anticipated d/c date is: 2 days              Patient currently is not medically stable to d/c.   Difficult to place patient No   Barriers to discharge: Asthma exacerbation-on IV steroids-not yet at baseline-Covid infection requiring 3 days of IV Remdesivir  Antimicorbials  :    Anti-infectives (From admission, onward)   Start     Dose/Rate Route Frequency Ordered Stop   06/17/20 1000  remdesivir 100 mg in sodium chloride 0.9 % 100 mL IVPB       "Followed by" Linked Group Details   100 mg 200 mL/hr over 30 Minutes Intravenous Daily 06/16/20 1101 06/18/20 0906    06/16/20 1115  remdesivir 200 mg in sodium chloride 0.9% 250 mL IVPB       "Followed by" Linked Group Details   200 mg 580 mL/hr over 30 Minutes Intravenous Once 06/16/20 1101 06/16/20 1455      Inpatient Medications  Scheduled Meds: . albuterol  6 puff Inhalation Q6H  . vitamin C  500 mg Oral Daily  . atorvastatin  40 mg Oral QHS  . benzonatate  200 mg Oral TID  . diltiazem  240 mg Oral Daily  . divalproex  1,000 mg Oral QHS  . enoxaparin (LOVENOX) injection  40 mg Subcutaneous Q24H  . famotidine  20 mg Oral Daily  . hydrALAZINE  50 mg Oral BID  . insulin aspart  0-15 Units Subcutaneous TID WC  . insulin aspart  0-5 Units Subcutaneous QHS  . ipratropium  2 puff Inhalation Q6H  . latanoprost  1 drop Both Eyes QHS  . memantine  10 mg Oral BID  . methylPREDNISolone (SOLU-MEDROL) injection  0.5 mg/kg Intravenous Q12H   Followed by  . [START ON 06/20/2020] predniSONE  50 mg Oral Daily  . montelukast  10 mg Oral QHS  . nebivolol  10 mg Oral Daily  . pantoprazole  40 mg Oral BID AC  . potassium chloride SA  20 mEq Oral Daily  . sodium chloride flush  3 mL Intravenous Q12H  . sodium chloride flush  3 mL Intravenous Q12H  . zinc sulfate  220 mg Oral Daily   Continuous Infusions: . sodium chloride     PRN Meds:.sodium chloride, acetaminophen, albuterol, ALPRAZolam, bisacodyl, chlorpheniramine-HYDROcodone, guaiFENesin-dextromethorphan, hydrALAZINE, ondansetron **OR** ondansetron (ZOFRAN) IV, oxyCODONE, polyethylene glycol, sodium chloride flush, sodium phosphate   Time Spent in minutes  25  See all Orders from today  for further details   Oren Binet M.D on 06/18/2020 at 12:17 PM  To page go to [email or web address] - use universal password  [facility] -  Office  [phone]    Objective:   Vitals:   06/18/20 0419 06/18/20 0742 06/18/20 0815 06/18/20 1207  BP: 137/66 (!) 152/81  (!) 146/69  Pulse: 84  87 70  Resp: 19 20 19 13   Temp: 97.7 F (36.5 C) 97.7 F (36.5  C)  98.7 F (37.1 C)  TempSrc: Oral Oral  Oral  SpO2: 99%  97% 96%  Weight:      Height:        Wt Readings from Last 3 Encounters:  06/16/20 63.3 kg  05/31/20 65.8 kg  05/11/20 65.8 kg     Intake/Output Summary (Last 24 hours) at 06/18/2020 1217 Last data filed at 06/17/2020 1600 Gross per 24 hour  Intake 100 ml  Output -  Net 100 ml     Physical Exam Gen Exam:Alert awake-not in any distress HEENT:atraumatic, normocephalic Chest: + Wheezing CVS:S1S2 regular Abdomen:soft non tender, non distended Extremities:no edema Neurology: Non focal Skin: no rash   Data Review:    CBC Recent Labs  Lab 06/16/20 0615 06/17/20 0110 06/18/20 0147  WBC 7.1 5.5 11.6*  HGB 12.2 10.7* 11.0*  HCT 35.9* 30.3* 32.3*  PLT 220 223 240  MCV 95.2 93.2 95.3  MCH 32.4 32.9 32.4  MCHC 34.0 35.3 34.1  RDW 14.0 13.9 13.9  LYMPHSABS 3.6 1.2 1.3  MONOABS 0.6 0.5 0.3  EOSABS 0.0 0.0 0.0  BASOSABS 0.0 0.0 0.0    Chemistries  Recent Labs  Lab 06/16/20 0615 06/17/20 0110 06/18/20 0147  NA 140 138 135  K 2.7* 4.0 3.9  CL 98 102 100  CO2 28 23 21*  GLUCOSE 150* 160* 223*  BUN 40* 35* 42*  CREATININE 1.74* 1.43* 1.76*  CALCIUM 9.4 9.4 9.4  MG  --  2.1 2.3  AST  --  20 27  ALT  --  18 19  ALKPHOS  --  48 49  BILITOT  --  0.7 0.6   ------------------------------------------------------------------------------------------------------------------ No results for input(s): CHOL, HDL, LDLCALC, TRIG, CHOLHDL, LDLDIRECT in the last 72 hours.  Lab Results  Component Value Date   HGBA1C 6.4 (H) 06/16/2020   ------------------------------------------------------------------------------------------------------------------ No results for input(s): TSH, T4TOTAL, T3FREE, THYROIDAB in the last 72 hours.  Invalid input(s): FREET3 ------------------------------------------------------------------------------------------------------------------ Recent Labs    06/17/20 0110 06/18/20 0147   FERRITIN 97 107    Coagulation profile No results for input(s): INR, PROTIME in the last 168 hours.  Recent Labs    06/17/20 0110 06/18/20 0147  DDIMER <0.27 0.30    Cardiac Enzymes No results for input(s): CKMB, TROPONINI, MYOGLOBIN in the last 168 hours.  Invalid input(s): CK ------------------------------------------------------------------------------------------------------------------    Component Value Date/Time   BNP 129.2 (H) 03/16/2018 0924   BNP <4.0 01/26/2016 1608    Micro Results Recent Results (from the past 240 hour(s))  Resp Panel by RT-PCR (Flu A&B, Covid) Nasopharyngeal Swab     Status: Abnormal   Collection Time: 06/16/20  5:50 AM   Specimen: Nasopharyngeal Swab; Nasopharyngeal(NP) swabs in vial transport medium  Result Value Ref Range Status   SARS Coronavirus 2 by RT PCR POSITIVE (A) NEGATIVE Final    Comment: RESULT CALLED TO, READ BACK BY AND VERIFIED WITH: RN C GRONE [id number] AT 725 AM BY CM (NOTE) SARS-CoV-2 target nucleic acids are DETECTED.  The SARS-CoV-2 RNA is generally detectable  in upper respiratory specimens during the acute phase of infection. Positive results are indicative of the presence of the identified virus, but do not rule out bacterial infection or co-infection with other pathogens not detected by the test. Clinical correlation with patient history and other diagnostic information is necessary to determine patient infection status. The expected result is Negative.  Fact Sheet for Patients: EntrepreneurPulse.com.au  Fact Sheet for Healthcare Providers: IncredibleEmployment.be  This test is not yet approved or cleared by the [place] FDA and  has been authorized for detection and/or diagnosis of SARS-CoV-2 by FDA under an Emergency Use Authorization (EUA).  This EUA will remain in effect (meaning this test can be  used) for the duration of  the COVID-19 declaration under Section  564(b)(1) of the Act, 21 U.S.C. section 360bbb-3(b)(1), unless the authorization is terminated or revoked sooner.     Influenza A by PCR NEGATIVE NEGATIVE Final   Influenza B by PCR NEGATIVE NEGATIVE Final    Comment: (NOTE) The Xpert Xpress SARS-CoV-2/FLU/RSV plus assay is intended as an aid in the diagnosis of influenza from Nasopharyngeal swab specimens and should not be used as a sole basis for treatment. Nasal washings and aspirates are unacceptable for Xpert Xpress SARS-CoV-2/FLU/RSV testing.  Fact Sheet for Patients: EntrepreneurPulse.com.au  Fact Sheet for Healthcare Providers: IncredibleEmployment.be  This test is not yet approved or cleared by the [place] FDA and has been authorized for detection and/or diagnosis of SARS-CoV-2 by FDA under an Emergency Use Authorization (EUA). This EUA will remain in effect (meaning this test can be used) for the duration of the COVID-19 declaration under Section 564(b)(1) of the Act, 21 U.S.C. section 360bbb-3(b)(1), unless the authorization is terminated or revoked.  Performed at [facility], [place] [address] [place], [place] [address]     Radiology Reports DG Chest Portable 1 View  Result Date: 06/16/2020 CLINICAL DATA:  Shortness of breath. EXAM: PORTABLE CHEST 1 VIEW COMPARISON:  04/30/2020. FINDINGS: Mediastinum and hilar structures normal. Lungs are clear. No pleural effusion or pneumothorax. Heart size normal. Degenerative changes scoliosis thoracic spine. IMPRESSION: No acute cardiopulmonary disease. Electronically Signed   By: Marcello Moores  Register   On: 06/16/2020 06:11   MYOCARDIAL PERFUSION IMAGING  Result Date: 05/31/2020  Nuclear stress EF: 75%.  There was no ST segment deviation noted during stress.  The study is normal.  This is a low risk study.  The left ventricular ejection fraction is hyperdynamic (>65%).  There is significant bowel radioisotope uptake in the  vicinity of the inferior LV wall, without evidence of perfusion defect. No ischemia or infarction on perfusion imaging.

## 2020-06-19 DIAGNOSIS — I5032 Chronic diastolic (congestive) heart failure: Secondary | ICD-10-CM | POA: Diagnosis not present

## 2020-06-19 DIAGNOSIS — E119 Type 2 diabetes mellitus without complications: Secondary | ICD-10-CM | POA: Diagnosis not present

## 2020-06-19 DIAGNOSIS — I10 Essential (primary) hypertension: Secondary | ICD-10-CM | POA: Diagnosis not present

## 2020-06-19 DIAGNOSIS — U071 COVID-19: Secondary | ICD-10-CM | POA: Diagnosis not present

## 2020-06-19 LAB — CBC WITH DIFFERENTIAL/PLATELET
Abs Immature Granulocytes: 0.09 10*3/uL — ABNORMAL HIGH (ref 0.00–0.07)
Basophils Absolute: 0 10*3/uL (ref 0.0–0.1)
Basophils Relative: 0 %
Eosinophils Absolute: 0 10*3/uL (ref 0.0–0.5)
Eosinophils Relative: 0 %
HCT: 32.2 % — ABNORMAL LOW (ref 36.0–46.0)
Hemoglobin: 11 g/dL — ABNORMAL LOW (ref 12.0–15.0)
Immature Granulocytes: 1 %
Lymphocytes Relative: 9 %
Lymphs Abs: 0.7 10*3/uL (ref 0.7–4.0)
MCH: 32.4 pg (ref 26.0–34.0)
MCHC: 34.2 g/dL (ref 30.0–36.0)
MCV: 94.7 fL (ref 80.0–100.0)
Monocytes Absolute: 0.3 10*3/uL (ref 0.1–1.0)
Monocytes Relative: 3 %
Neutro Abs: 7.3 10*3/uL (ref 1.7–7.7)
Neutrophils Relative %: 87 %
Platelets: 226 10*3/uL (ref 150–400)
RBC: 3.4 MIL/uL — ABNORMAL LOW (ref 3.87–5.11)
RDW: 13.8 % (ref 11.5–15.5)
WBC: 8.4 10*3/uL (ref 4.0–10.5)
nRBC: 0 % (ref 0.0–0.2)

## 2020-06-19 LAB — COMPREHENSIVE METABOLIC PANEL
ALT: 20 U/L (ref 0–44)
AST: 20 U/L (ref 15–41)
Albumin: 3.4 g/dL — ABNORMAL LOW (ref 3.5–5.0)
Alkaline Phosphatase: 43 U/L (ref 38–126)
Anion gap: 12 (ref 5–15)
BUN: 36 mg/dL — ABNORMAL HIGH (ref 8–23)
CO2: 24 mmol/L (ref 22–32)
Calcium: 9.1 mg/dL (ref 8.9–10.3)
Chloride: 101 mmol/L (ref 98–111)
Creatinine, Ser: 1.56 mg/dL — ABNORMAL HIGH (ref 0.44–1.00)
GFR, Estimated: 35 mL/min — ABNORMAL LOW (ref >60)
Glucose, Bld: 219 mg/dL — ABNORMAL HIGH (ref 70–99)
Potassium: 3.6 mmol/L (ref 3.5–5.1)
Sodium: 137 mmol/L (ref 135–145)
Total Bilirubin: 0.6 mg/dL (ref 0.3–1.2)
Total Protein: 6.5 g/dL (ref 6.5–8.1)

## 2020-06-19 LAB — GLUCOSE, CAPILLARY
Glucose-Capillary: 238 mg/dL — ABNORMAL HIGH (ref 70–99)
Glucose-Capillary: 247 mg/dL — ABNORMAL HIGH (ref 70–99)

## 2020-06-19 LAB — PHOSPHORUS: Phosphorus: 2.7 mg/dL (ref 2.5–4.6)

## 2020-06-19 LAB — MAGNESIUM: Magnesium: 2.2 mg/dL (ref 1.7–2.4)

## 2020-06-19 LAB — C-REACTIVE PROTEIN: CRP: 1.1 mg/dL — ABNORMAL HIGH (ref <1.0)

## 2020-06-19 LAB — D-DIMER, QUANTITATIVE: D-Dimer, Quant: 0.27 ug/mL-FEU (ref 0.00–0.50)

## 2020-06-19 LAB — FERRITIN: Ferritin: 94 ng/mL (ref 11–307)

## 2020-06-19 MED ORDER — PREDNISONE 10 MG PO TABS: ORAL_TABLET | ORAL | 0 refills | Status: DC

## 2020-06-19 MED ORDER — AZITHROMYCIN 500 MG PO TABS: 500.0000 mg | ORAL_TABLET | Freq: Every day | ORAL | 0 refills | Status: DC

## 2020-06-19 MED ORDER — HYDROCOD POLST-CPM POLST ER 10-8 MG/5ML PO SUER: 5.0000 mL | Freq: Two times a day (BID) | ORAL | 0 refills | Status: DC | PRN

## 2020-06-19 MED ORDER — FAMOTIDINE 20 MG PO TABS: 20.0000 mg | ORAL_TABLET | Freq: Every day | ORAL | 0 refills | Status: DC

## 2020-06-19 NOTE — Plan of Care (Signed)
  Problem: Clinical Measurements: Goal: Ability to maintain clinical measurements within normal limits will improve Outcome: Progressing Goal: Will remain free from infection Outcome: Progressing Goal: Respiratory complications will improve Outcome: Progressing Goal: Cardiovascular complication will be avoided Outcome: Progressing   Problem: Activity: Goal: Risk for activity intolerance will decrease Outcome: Progressing   Problem: Nutrition: Goal: Adequate nutrition will be maintained Outcome: Progressing   Problem: Coping: Goal: Level of anxiety will decrease Outcome: Progressing   No acute events took place overnight. Pt ambulated in the hall before going to bed. Vital signs remain stable, will continue to monitor.

## 2020-06-19 NOTE — Progress Notes (Signed)
Patient discharged from facility via w/c. All discharge instructions reviewed at bedside. All questions answered. Patient verbalized understanding. IV access removed, catheter intact, pressure dressing applied. All patient belongings in disposition of the patient. No further needs at this time.

## 2020-06-19 NOTE — Progress Notes (Signed)
Feels much better today-claims that she is back to her baseline.  Wants to go home-since clinically improved-stable for discharge today.  See discharge summary for further details.

## 2020-06-19 NOTE — Plan of Care (Signed)
  Problem: Health Behavior/Discharge Planning: Goal: Ability to manage health-related needs will improve Outcome: Progressing   Problem: Clinical Measurements: Goal: Ability to maintain clinical measurements within normal limits will improve Outcome: Progressing Goal: Will remain free from infection Outcome: Progressing Goal: Diagnostic test results will improve Outcome: Progressing Goal: Respiratory complications will improve Outcome: Progressing Goal: Cardiovascular complication will be avoided Outcome: Progressing   Problem: Activity: Goal: Risk for activity intolerance will decrease Outcome: Progressing   Problem: Nutrition: Goal: Adequate nutrition will be maintained Outcome: Progressing   Problem: Coping: Goal: Level of anxiety will decrease Outcome: Progressing   Problem: Pain Managment: Goal: General experience of comfort will improve Outcome: Progressing   Problem: Skin Integrity: Goal: Risk for impaired skin integrity will decrease Outcome: Progressing

## 2020-06-19 NOTE — Plan of Care (Signed)
  Problem: Health Behavior/Discharge Planning: Goal: Ability to manage health-related needs will improve 06/19/2020 1238 by Luvenia Redden, RN Outcome: Adequate for Discharge 06/19/2020 1210 by Luvenia Redden, RN Outcome: Progressing   Problem: Clinical Measurements: Goal: Ability to maintain clinical measurements within normal limits will improve 06/19/2020 1238 by Luvenia Redden, RN Outcome: Adequate for Discharge 06/19/2020 1210 by Luvenia Redden, RN Outcome: Progressing Goal: Will remain free from infection 06/19/2020 1238 by Luvenia Redden, RN Outcome: Adequate for Discharge 06/19/2020 1210 by Luvenia Redden, RN Outcome: Progressing Goal: Diagnostic test results will improve 06/19/2020 1238 by Luvenia Redden, RN Outcome: Adequate for Discharge 06/19/2020 1210 by Luvenia Redden, RN Outcome: Progressing Goal: Respiratory complications will improve 06/19/2020 1238 by Luvenia Redden, RN Outcome: Adequate for Discharge 06/19/2020 1210 by Luvenia Redden, RN Outcome: Progressing Goal: Cardiovascular complication will be avoided 06/19/2020 1238 by Luvenia Redden, RN Outcome: Adequate for Discharge 06/19/2020 1210 by Luvenia Redden, RN Outcome: Progressing   Problem: Activity: Goal: Risk for activity intolerance will decrease 06/19/2020 1238 by Luvenia Redden, RN Outcome: Adequate for Discharge 06/19/2020 1210 by Luvenia Redden, RN Outcome: Progressing   Problem: Nutrition: Goal: Adequate nutrition will be maintained 06/19/2020 1238 by Luvenia Redden, RN Outcome: Adequate for Discharge 06/19/2020 1210 by Luvenia Redden, RN Outcome: Progressing   Problem: Coping: Goal: Level of anxiety will decrease 06/19/2020 1238 by Luvenia Redden, RN Outcome: Adequate for Discharge 06/19/2020 1210 by Luvenia Redden, RN Outcome: Progressing   Problem: Elimination: Goal: Will not experience complications related to bowel motility Outcome: Adequate for  Discharge Goal: Will not experience complications related to urinary retention Outcome: Adequate for Discharge   Problem: Pain Managment: Goal: General experience of comfort will improve 06/19/2020 1238 by Luvenia Redden, RN Outcome: Adequate for Discharge 06/19/2020 1210 by Luvenia Redden, RN Outcome: Progressing   Problem: Skin Integrity: Goal: Risk for impaired skin integrity will decrease 06/19/2020 1238 by Luvenia Redden, RN Outcome: Adequate for Discharge 06/19/2020 1210 by Luvenia Redden, RN Outcome: Progressing

## 2020-06-19 NOTE — Discharge Summary (Signed)
PATIENT DETAILS Name: [name] Age: 70 y.o. Sex: female Date of Birth: 1950/07/24 MRN: [id number]. Admitting Physician: Karmen Bongo, MD DDU:[id number], [name], MD  Admit Date: 06/16/2020 Discharge date: 06/19/2020  Recommendations for Outpatient Follow-up:  1. Follow up with PCP in 1-2 weeks 2. Please obtain CMP/CBC in one week  Admitted From:  Home  Disposition: [place]: No  Equipment/Devices: None  Discharge Condition: Stable  CODE STATUS: FULL CODE  Diet recommendation:  Diet Order            Diet - low sodium heart healthy           Diet Carb Modified Fluid consistency: Thin; Room service appropriate? Yes  Diet effective now                 Brief Narrative: Patient is a 70 y.o. female with PMHx of CAD, HLD, DM, chronic diastolic heart failure, asthma-presenting with shortness of breath-thought to have COPD exacerbation due to COVID-19 infection.  See below for further details  COVID-19 vaccinated status: Vaccinated including booster  Significant Events: 2/16>> Admit to [facility] : [facility] for asthma exacerbation  Significant studies: 2/16>>Chest x-ray: No pneumonia  COVID-19 medications: Steroids: 2/16>> Remdesivir: 2/16>>2/18  Antibiotics: Zithromax:2/18>>  Microbiology data: None  Procedures: None  Consults: None  Brief Hospital Course: Asthma exacerbation: Slowly improved-suspect that she has some component of upper airway syndrome as well-she apparently has significant amount of GERD symptoms-and follows closely with GI MD.  She was treated with a combination of IV steroids/bronchodilators/anti-GERD medications/empiric Zithromax.  She feels much better today-and thinks that she is back to her baseline-she is no longer "feeling tight".  On exam-she has good air entry and hardly any rhonchi.  Stable for discharge-continue tapering steroids-resume usual inhaler regimen-continue Zithromax for 2 additional days.  COVID-19  infection: Fully vaccinated/boosted-treated with a 3-day course of Remdesivir.  She is otherwise asymptomatic  GERD:  GERD symptoms are stable-continue PPI-Pepcid.  She will follow with Dr. Cristina Gong for further continued work-up-including barium esophagogram.  AKI on CKD stage IIIa: AKI hemodynamically mediated-back to baseline.  Chronic diastolic heart failure: Euvolemic on exam-resume usual diuretic regimen on discharge.  CAD: No anginal symptoms  HLD: Lipitor  HTN: BP stable-continue diltiazem, hydralazine, chlorthalidone and Bystolic  DM-2: CBG stable on SSI during this hospital stay-resume Metformin on discharge.  COVID-19 Labs:  Recent Labs    06/16/20 1248 06/17/20 0110 06/18/20 0147 06/19/20 0244  DDIMER 0.31 <0.27 0.30 0.27  FERRITIN 108 97 107 94  LDH 141  --   --   --   CRP 0.6 0.6 0.7 1.1*    Lab Results  Component Value Date   SARSCOV2NAA POSITIVE (A) 06/16/2020   [place] Not Detected 05/24/2020   [place] NEGATIVE 09/14/2019     Seizure disorder: Continue Depakote  ?Dementia/anxiety: Stable-continue Xanax/Namenda   Discharge Diagnoses:  Principal Problem:   Acute respiratory disease due to COVID-19 virus Active Problems:   Essential hypertension   Diabetes mellitus type 2, controlled ([place])   Asthma exacerbation   CAD in native artery   HLD (hyperlipidemia)   Chronic diastolic CHF (congestive heart failure) [facility])   Discharge Instructions:    Person Under Monitoring Name: [name]  Location: [place] [address]   Infection Prevention Recommendations for Individuals Confirmed to have, or Being Evaluated for, 2019 Novel Coronavirus (COVID-19) Infection Who Receive Care at Home  Individuals who are confirmed to have, or are being evaluated for, COVID-19  should follow the prevention steps below until a healthcare provider or local or state health department says they can return to normal  activities.  Stay home except to get medical care You should restrict activities outside your home, except for getting medical care. Do not go to work, school, or public areas, and do not use public transportation or taxis.  Call ahead before visiting your doctor Before your medical appointment, call the healthcare provider and tell them that you have, or are being evaluated for, COVID-19 infection. This will help the healthcare provider's office take steps to keep other people from getting infected. Ask your healthcare provider to call the local or state health department.  Monitor your symptoms Seek prompt medical attention if your illness is worsening (e.g., difficulty breathing). Before going to your medical appointment, call the healthcare provider and tell them that you have, or are being evaluated for, COVID-19 infection. Ask your healthcare provider to call the local or state health department.  Wear a facemask You should wear a facemask that covers your nose and mouth when you are in the same room with other people and when you visit a healthcare provider. People who live with or visit you should also wear a facemask while they are in the same room with you.  Separate yourself from other people in your home As much as possible, you should stay in a different room from other people in your home. Also, you should use a separate bathroom, if available.  Avoid sharing household items You should not share dishes, drinking glasses, cups, eating utensils, towels, bedding, or other items with other people in your home. After using these items, you should wash them thoroughly with soap and water.  Cover your coughs and sneezes Cover your mouth and nose with a tissue when you cough or sneeze, or you can cough or sneeze into your sleeve. Throw used tissues in a lined trash can, and immediately wash your hands with soap and water for at least 20 seconds or use an alcohol-based hand  rub.  Wash your [facility] your hands often and thoroughly with soap and water for at least 20 seconds. You can use an alcohol-based hand sanitizer if soap and water are not available and if your hands are not visibly dirty. Avoid touching your eyes, nose, and mouth with unwashed hands.   Prevention Steps for Caregivers and Household Members of Individuals Confirmed to have, or Being Evaluated for, COVID-19 Infection Being Cared for in the Home  If you live with, or provide care at home for, a person confirmed to have, or being evaluated for, COVID-19 infection please follow these guidelines to prevent infection:  Follow healthcare provider's instructions Make sure that you understand and can help the patient follow any healthcare provider instructions for all care.  Provide for the patient's basic needs You should help the patient with basic needs in the home and provide support for getting groceries, prescriptions, and other personal needs.  Monitor the patient's symptoms If they are getting sicker, call his or her medical provider and tell them that the patient has, or is being evaluated for, COVID-19 infection. This will help the healthcare provider's office take steps to keep other people from getting infected. Ask the healthcare provider to call the local or state health department.  Limit the number of people who have contact with the patient  If possible, have only one caregiver for the patient.  Other household members should stay in another home or  place of residence. If this is not possible, they should stay  in another room, or be separated from the patient as much as possible. Use a separate bathroom, if available.  Restrict visitors who do not have an essential need to be in the home.  Keep older adults, very young children, and other sick people away from the patient Keep older adults, very young children, and those who have compromised immune systems or chronic  health conditions away from the patient. This includes people with chronic heart, lung, or kidney conditions, diabetes, and cancer.  Ensure good ventilation Make sure that shared spaces in the home have good air flow, such as from an air conditioner or an opened window, weather permitting.  Wash your hands often  Wash your hands often and thoroughly with soap and water for at least 20 seconds. You can use an alcohol based hand sanitizer if soap and water are not available and if your hands are not visibly dirty.  Avoid touching your eyes, nose, and mouth with unwashed hands.  Use disposable paper towels to dry your hands. If not available, use dedicated cloth towels and replace them when they become wet.  Wear a facemask and gloves  Wear a disposable facemask at all times in the room and gloves when you touch or have contact with the patient's blood, body fluids, and/or secretions or excretions, such as sweat, saliva, sputum, nasal mucus, vomit, urine, or feces.  Ensure the mask fits over your nose and mouth tightly, and do not touch it during use.  Throw out disposable facemasks and gloves after using them. Do not reuse.  Wash your hands immediately after removing your facemask and gloves.  If your personal clothing becomes contaminated, carefully remove clothing and launder. Wash your hands after handling contaminated clothing.  Place all used disposable facemasks, gloves, and other waste in a lined container before disposing them with other household waste.  Remove gloves and wash your hands immediately after handling these items.  Do not share dishes, glasses, or other household items with the patient  Avoid sharing household items. You should not share dishes, drinking glasses, cups, eating utensils, towels, bedding, or other items with a patient who is confirmed to have, or being evaluated for, COVID-19 infection.  After the person uses these items, you should wash them  thoroughly with soap and water.  Wash laundry thoroughly  Immediately remove and wash clothes or bedding that have blood, body fluids, and/or secretions or excretions, such as sweat, saliva, sputum, nasal mucus, vomit, urine, or feces, on them.  Wear gloves when handling laundry from the patient.  Read and follow directions on labels of laundry or clothing items and detergent. In general, wash and dry with the warmest temperatures recommended on the label.  Clean all areas the individual has used often  Clean all touchable surfaces, such as counters, tabletops, doorknobs, bathroom fixtures, toilets, phones, keyboards, tablets, and bedside tables, every day. Also, clean any surfaces that may have blood, body fluids, and/or secretions or excretions on them.  Wear gloves when cleaning surfaces the patient has come in contact with.  Use a diluted bleach solution (e.g., dilute bleach with 1 part bleach and 10 parts water) or a household disinfectant with a label that says EPA-registered for coronaviruses. To make a bleach solution at home, add 1 tablespoon of bleach to 1 quart (4 cups) of water. For a larger supply, add  cup of bleach to 1 gallon (16 cups) of water.  Read labels of cleaning products and follow recommendations provided on product labels. Labels contain instructions for safe and effective use of the cleaning product including precautions you should take when applying the product, such as wearing gloves or eye protection and making sure you have good ventilation during use of the product.  Remove gloves and wash hands immediately after cleaning.  Monitor yourself for signs and symptoms of illness Caregivers and household members are considered close contacts, should monitor their health, and will be asked to limit movement outside of the home to the extent possible. Follow the monitoring steps for close contacts listed on the symptom monitoring form.   ? If you have additional  questions, contact your local health department or call the epidemiologist on call at [phone] (available 24/7). ? This guidance is subject to change. For the most up-to-date guidance from CDC, please refer to their website: YouBlogs.pl    Activity:  As tolerated   Discharge Instructions    (HEART FAILURE PATIENTS) Call MD:  Anytime you have any of the following symptoms: 1) 3 pound weight gain in 24 hours or 5 pounds in 1 week 2) shortness of breath, with or without a dry hacking cough 3) swelling in the hands, feet or stomach 4) if you have to sleep on extra pillows at night in order to breathe.   Complete by: As directed    Call MD for:  difficulty breathing, headache or visual disturbances   Complete by: As directed    Diet - low sodium heart healthy   Complete by: As directed    Discharge instructions   Complete by: As directed    Follow with Primary MD  [name], MD in 1-2 weeks  Please follow with your primary pulmonologist MD-Dr. Melvyn Novas in 1 week  Please get a complete blood count and chemistry panel checked by your Primary MD at your next visit, and again as instructed by your Primary MD.  Get Medicines reviewed and adjusted: Please take all your medications with you for your next visit with your Primary MD  Laboratory/radiological data: Please request your Primary MD to go over all hospital tests and procedure/radiological results at the follow up, please ask your Primary MD to get all Hospital records sent to his/her office.  In some cases, they will be blood work, cultures and biopsy results pending at the time of your discharge. Please request that your primary care M.D. follows up on these results.  Also Note the following: If you experience worsening of your admission symptoms, develop shortness of breath, life threatening emergency, suicidal or homicidal thoughts you must seek medical attention  immediately by calling 911 or calling your MD immediately  if symptoms less severe.  You must read complete instructions/literature along with all the possible adverse reactions/side effects for all the Medicines you take and that have been prescribed to you. Take any new Medicines after you have completely understood and accpet all the possible adverse reactions/side effects.   Do not drive when taking Pain medications or sleeping medications (Benzodaizepines)  Do not take more than prescribed Pain, Sleep and Anxiety Medications. It is not advisable to combine anxiety,sleep and pain medications without talking with your primary care practitioner  Special Instructions: If you have smoked or chewed Tobacco  in the last 2 yrs please stop smoking, stop any regular Alcohol  and or any Recreational drug use.  Wear Seat belts while driving.  Please note: You were cared for by a hospitalist during your  hospital stay. Once you are discharged, your primary care physician will handle any further medical issues. Please note that NO REFILLS for any discharge medications will be authorized once you are discharged, as it is imperative that you return to your primary care physician (or establish a relationship with a primary care physician if you do not have one) for your post hospital discharge needs so that they can reassess your need for medications and monitor your lab values.   1.)  5-7 days of isolation from 2/16 or from your day of the first positive Covid test   Increase activity slowly   Complete by: As directed      Allergies as of 06/19/2020      Reactions   Tramadol Other (See Comments)   Seizure d/o      Medication List    TAKE these medications   albuterol (2.5 MG/3ML) 0.083% nebulizer solution Commonly known as: PROVENTIL Take 2.5 mg by nebulization every 4 (four) hours as needed for wheezing or shortness of breath.   albuterol 108 (90 Base) MCG/ACT inhaler Commonly known as: ProAir  HFA Inhale 2 puffs into the lungs every 4 (four) hours as needed for wheezing or shortness of breath.   ALPRAZolam 0.5 MG tablet Commonly known as: XANAX Take 1 tablet (0.5 mg total) by mouth 2 (two) times daily as needed for anxiety.   atorvastatin 40 MG tablet Commonly known as: LIPITOR Take 40 mg by mouth at bedtime.   azithromycin 500 MG tablet Commonly known as: ZITHROMAX Take 1 tablet (500 mg total) by mouth daily. Start taking on: June 20, 2020   Biotin 5000 MCG Caps Take 1 capsule by mouth daily.   budesonide 0.5 MG/2ML nebulizer solution Commonly known as: PULMICORT Take 2 mLs (0.5 mg total) by nebulization in the morning and at bedtime.   chlorpheniramine-HYDROcodone 10-8 MG/5ML Suer Commonly known as: TUSSIONEX Take 5 mLs by mouth every 12 (twelve) hours as needed for cough.   chlorthalidone 25 MG tablet Commonly known as: HYGROTON Take 1 tablet (25 mg total) by mouth daily.   diltiazem 240 MG 24 hr capsule Commonly known as: Cartia XT Take 1 capsule (240 mg total) by mouth daily.   divalproex 500 MG 24 hr tablet Commonly known as: Depakote ER Take 2 tablets (1,000 mg total) by mouth at bedtime.   esomeprazole 40 MG capsule Commonly known as: NEXIUM Take 40 mg by mouth daily at 12 noon.   famotidine 20 MG tablet Commonly known as: PEPCID Take 1 tablet (20 mg total) by mouth daily. Start taking on: June 20, 2020   Flutter Devi by Does not apply route.   formoterol 20 MCG/2ML nebulizer solution Commonly known as: Perforomist Take 2 mLs (20 mcg total) by nebulization 2 (two) times daily.   hydrALAZINE 50 MG tablet Commonly known as: APRESOLINE Take 1 tablet (50 mg total) by mouth 2 (two) times daily.   latanoprost 0.005 % ophthalmic solution Commonly known as: XALATAN   memantine 10 MG tablet Commonly known as: NAMENDA Take 1 tablet (10 mg total) by mouth 2 (two) times daily.   metFORMIN 500 MG tablet Commonly known as:  GLUCOPHAGE Take 500 mg by mouth 2 (two) times daily with a meal.   montelukast 10 MG tablet Commonly known as: SINGULAIR TAKE 1 TABLET BY MOUTH AT BEDTIME   multivitamin with minerals Tabs tablet Take 1 tablet by mouth daily.   nebivolol 10 MG tablet Commonly known as: BYSTOLIC Take 1 tablet (10 mg  total) by mouth daily.   nitroGLYCERIN 0.4 MG SL tablet Commonly known as: NITROSTAT DISSOLVE ONE TABLET UNDER THE TONGUE EVERY 5 MINUTES AS NEEDED FOR CHEST PAIN.  DO NOT EXCEED A TOTAL OF 3 DOSES IN 15 MINUTES   potassium chloride SA 20 MEQ tablet Commonly known as: KLOR-CON Take 20 mEq by mouth daily.   predniSONE 10 MG tablet Commonly known as: DELTASONE Take 40 mg daily for 1 day, 30 mg daily for 1 day, 20 mg daily for 1 days,10 mg daily for 1 day, then stop   torsemide 20 MG tablet Commonly known as: DEMADEX Take 20 mg by mouth daily.   traMADol 50 MG tablet Commonly known as: ULTRAM Take 50 mg by mouth every 4 (four) hours as needed.       Follow-up Information    [name], MD. Schedule an appointment as soon as possible for a visit in 1 week(s).   Specialty: Family Medicine Contact information: [address] [place] [address] [phone]        Jerline Pain, MD Follow up in 2 week(s).   Specialty: Cardiology Contact information: [address] [place] [address] [place] [address] [phone]        Tanda Rockers, MD. Schedule an appointment as soon as possible for a visit in 1 week(s).   Specialty: Pulmonary Disease Contact information: [place] [address] [phone]              Allergies  Allergen Reactions  . Tramadol Other (See Comments)    Seizure d/o      Other Procedures/Studies: DG Chest Portable 1 View  Result Date: 06/16/2020 CLINICAL DATA:  Shortness of breath. EXAM: PORTABLE CHEST 1 VIEW COMPARISON:  04/30/2020. FINDINGS: Mediastinum and hilar structures normal.  Lungs are clear. No pleural effusion or pneumothorax. Heart size normal. Degenerative changes scoliosis thoracic spine. IMPRESSION: No acute cardiopulmonary disease. Electronically Signed   By: Marcello Moores  Register   On: 06/16/2020 06:11   MYOCARDIAL PERFUSION IMAGING  Result Date: 05/31/2020  Nuclear stress EF: 75%.  There was no ST segment deviation noted during stress.  The study is normal.  This is a low risk study.  The left ventricular ejection fraction is hyperdynamic (>65%).  There is significant bowel radioisotope uptake in the vicinity of the inferior LV wall, without evidence of perfusion defect. No ischemia or infarction on perfusion imaging.     TODAY-DAY OF DISCHARGE:  Subjective:   Jenniefer Salak today has no headache,no chest abdominal pain,no new weakness tingling or numbness, feels much better wants to go home today.   Objective:   Blood pressure (!) 150/69, pulse 82, temperature 98.8 F (37.1 C), temperature source Oral, resp. rate 14, height 5\' 1"  (1.549 m), weight 66.4 kg, SpO2 100 %.  Intake/Output Summary (Last 24 hours) at 06/19/2020 1015 Last data filed at 06/18/2020 1357 Gross per 24 hour  Intake 120 ml  Output -  Net 120 ml   Filed Weights   06/16/20 1100 06/16/20 2119 06/19/20 0531  Weight: 68 kg 63.3 kg 66.4 kg    Exam: Awake Alert, Oriented *3, No new F.N deficits, Normal affect [place].AT,PERRAL Supple Neck,No JVD, No cervical lymphadenopathy appriciated.  Symmetrical Chest wall movement, Good air movement bilaterally, CTAB RRR,No Gallops,Rubs or new Murmurs, No Parasternal Heave +ve B.Sounds, Abd Soft, Non tender, No organomegaly appriciated, No rebound -guarding or rigidity. No Cyanosis, Clubbing or edema, No new Rash or bruise  PERTINENT RADIOLOGIC STUDIES: DG Chest Portable 1 View  Result Date: 06/16/2020 CLINICAL DATA:  Shortness of breath. EXAM: PORTABLE CHEST 1 VIEW COMPARISON:  04/30/2020. FINDINGS: Mediastinum and hilar structures normal.  Lungs are clear. No pleural effusion or pneumothorax. Heart size normal. Degenerative changes scoliosis thoracic spine. IMPRESSION: No acute cardiopulmonary disease. Electronically Signed   By: Marcello Moores  Register   On: 06/16/2020 06:11   MYOCARDIAL PERFUSION IMAGING  Result Date: 05/31/2020  Nuclear stress EF: 75%.  There was no ST segment deviation noted during stress.  The study is normal.  This is a low risk study.  The left ventricular ejection fraction is hyperdynamic (>65%).  There is significant bowel radioisotope uptake in the vicinity of the inferior LV wall, without evidence of perfusion defect. No ischemia or infarction on perfusion imaging.     PERTINENT LAB RESULTS: CBC: Recent Labs    06/18/20 0147 06/19/20 0244  WBC 11.6* 8.4  HGB 11.0* 11.0*  HCT 32.3* 32.2*  PLT 240 226   CMET CMP     Component Value Date/Time   NA 137 06/19/2020 0244   NA 141 02/26/2020 1350   K 3.6 06/19/2020 0244   CL 101 06/19/2020 0244   CO2 24 06/19/2020 0244   GLUCOSE 219 (H) 06/19/2020 0244   BUN 36 (H) 06/19/2020 0244   BUN 31 (H) 02/26/2020 1350   CREATININE 1.56 (H) 06/19/2020 0244   CREATININE 1.06 (H) 01/26/2016 1608   CALCIUM 9.1 06/19/2020 0244   PROT 6.5 06/19/2020 0244   PROT 7.6 05/17/2017 1141   ALBUMIN 3.4 (L) 06/19/2020 0244   ALBUMIN 4.3 05/17/2017 1141   AST 20 06/19/2020 0244   ALT 20 06/19/2020 0244   ALKPHOS 43 06/19/2020 0244   BILITOT 0.6 06/19/2020 0244   BILITOT 0.4 05/17/2017 1141   GFRNONAA 35 (L) 06/19/2020 0244   GFRAA 50 (L) 02/26/2020 1350    GFR Estimated Creatinine Clearance: 28.8 mL/min (A) (by C-G formula based on SCr of 1.56 mg/dL (H)). No results for input(s): LIPASE, AMYLASE in the last 72 hours. No results for input(s): CKTOTAL, CKMB, CKMBINDEX, TROPONINI in the last 72 hours. Invalid input(s): POCBNP Recent Labs    06/18/20 0147 06/19/20 0244  DDIMER 0.30 0.27   Recent Labs    06/16/20 1250  HGBA1C 6.4*   No results for  input(s): CHOL, HDL, LDLCALC, TRIG, CHOLHDL, LDLDIRECT in the last 72 hours. No results for input(s): TSH, T4TOTAL, T3FREE, THYROIDAB in the last 72 hours.  Invalid input(s): FREET3 Recent Labs    06/18/20 0147 06/19/20 0244  FERRITIN 107 94   Coags: No results for input(s): INR in the last 72 hours.  Invalid input(s): PT Microbiology: Recent Results (from the past 240 hour(s))  Resp Panel by RT-PCR (Flu A&B, Covid) Nasopharyngeal Swab     Status: Abnormal   Collection Time: 06/16/20  5:50 AM   Specimen: Nasopharyngeal Swab; Nasopharyngeal(NP) swabs in vial transport medium  Result Value Ref Range Status   SARS Coronavirus 2 by RT PCR POSITIVE (A) NEGATIVE Final    Comment: RESULT CALLED TO, READ BACK BY AND VERIFIED WITH: RN C GRONE [id number] AT 725 AM BY CM (NOTE) SARS-CoV-2 target nucleic acids are DETECTED.  The SARS-CoV-2 RNA is generally detectable in upper respiratory specimens during the acute phase of infection. Positive results are indicative of the presence of the identified virus, but do not rule out bacterial infection or co-infection with other pathogens not detected by the test. Clinical correlation with patient history  and other diagnostic information is necessary to determine patient infection status. The expected result is Negative.  Fact Sheet for Patients: EntrepreneurPulse.com.au  Fact Sheet for Healthcare Providers: IncredibleEmployment.be  This test is not yet approved or cleared by the [place] FDA and  has been authorized for detection and/or diagnosis of SARS-CoV-2 by FDA under an Emergency Use Authorization (EUA).  This EUA will remain in effect (meaning this test can be  used) for the duration of  the COVID-19 declaration under Section 564(b)(1) of the Act, 21 U.S.C. section 360bbb-3(b)(1), unless the authorization is terminated or revoked sooner.     Influenza A by PCR NEGATIVE NEGATIVE Final    Influenza B by PCR NEGATIVE NEGATIVE Final    Comment: (NOTE) The Xpert Xpress SARS-CoV-2/FLU/RSV plus assay is intended as an aid in the diagnosis of influenza from Nasopharyngeal swab specimens and should not be used as a sole basis for treatment. Nasal washings and aspirates are unacceptable for Xpert Xpress SARS-CoV-2/FLU/RSV testing.  Fact Sheet for Patients: EntrepreneurPulse.com.au  Fact Sheet for Healthcare Providers: IncredibleEmployment.be  This test is not yet approved or cleared by the [place] FDA and has been authorized for detection and/or diagnosis of SARS-CoV-2 by FDA under an Emergency Use Authorization (EUA). This EUA will remain in effect (meaning this test can be used) for the duration of the COVID-19 declaration under Section 564(b)(1) of the Act, 21 U.S.C. section 360bbb-3(b)(1), unless the authorization is terminated or revoked.  Performed at [facility], [place] [address], [place], [place] [address]     FURTHER DISCHARGE INSTRUCTIONS:  Get Medicines reviewed and adjusted: Please take all your medications with you for your next visit with your Primary MD  Laboratory/radiological data: Please request your Primary MD to go over all hospital tests and procedure/radiological results at the follow up, please ask your Primary MD to get all Hospital records sent to his/her office.  In some cases, they will be blood work, cultures and biopsy results pending at the time of your discharge. Please request that your primary care M.D. goes through all the records of your hospital data and follows up on these results.  Also Note the following: If you experience worsening of your admission symptoms, develop shortness of breath, life threatening emergency, suicidal or homicidal thoughts you must seek medical attention immediately by calling 911 or calling your MD immediately  if symptoms less severe.  You must read complete  instructions/literature along with all the possible adverse reactions/side effects for all the Medicines you take and that have been prescribed to you. Take any new Medicines after you have completely understood and accpet all the possible adverse reactions/side effects.   Do not drive when taking Pain medications or sleeping medications (Benzodaizepines)  Do not take more than prescribed Pain, Sleep and Anxiety Medications. It is not advisable to combine anxiety,sleep and pain medications without talking with your primary care practitioner  Special Instructions: If you have smoked or chewed Tobacco  in the last 2 yrs please stop smoking, stop any regular Alcohol  and or any Recreational drug use.  Wear Seat belts while driving.  Please note: You were cared for by a hospitalist during your hospital stay. Once you are discharged, your primary care physician will handle any further medical issues. Please note that NO REFILLS for any discharge medications will be authorized once you are discharged, as it is imperative that you return to your primary care physician (or establish a relationship with a primary care  physician if you do not have one) for your post hospital discharge needs so that they can reassess your need for medications and monitor your lab values.  Total Time spent coordinating discharge including counseling, education and face to face time equals 35 minutes.  SignedOren Binet 06/19/2020 10:15 AM

## 2020-06-28 ENCOUNTER — Encounter: Payer: Self-pay | Admitting: Adult Health

## 2020-06-28 ENCOUNTER — Ambulatory Visit (INDEPENDENT_AMBULATORY_CARE_PROVIDER_SITE_OTHER): Payer: Medicare Other | Admitting: Adult Health

## 2020-06-28 ENCOUNTER — Other Ambulatory Visit: Payer: Self-pay

## 2020-06-28 DIAGNOSIS — J069 Acute upper respiratory infection, unspecified: Secondary | ICD-10-CM

## 2020-06-28 DIAGNOSIS — K219 Gastro-esophageal reflux disease without esophagitis: Secondary | ICD-10-CM | POA: Diagnosis not present

## 2020-06-28 DIAGNOSIS — J4541 Moderate persistent asthma with (acute) exacerbation: Secondary | ICD-10-CM | POA: Diagnosis not present

## 2020-06-28 DIAGNOSIS — U071 COVID-19: Secondary | ICD-10-CM | POA: Diagnosis not present

## 2020-06-28 NOTE — Assessment & Plan Note (Signed)
Flare -cont with GI follow up and GERD diet and Tx regimen

## 2020-06-28 NOTE — Progress Notes (Signed)
@Patient  ID: [name], female    DOB: March 26, 1951, 70 y.o.   MRN: [id number]  Chief Complaint  Patient presents with  . Follow-up    Referring provider: [name], MD  HPI: 70 year old female former smoker followed for asthma and chronic cough Medical history significant for coronary artery disease, diabetes, diastolic heart failure and seizure disorder  TEST/EVENTS :  FEN0 01/24/2016= 13 with active symptoms on symb 160 2bid >changed to dulera 100  - Spirometry 02/23/2016 NO signifiant obstruction with active symptoms  - FENO 02/23/2016= 12 On dulera 100 2bid (unable to confirm adeherence as did not bring it - Singulair 10 mg daily added 10/25/2017to dulera 100 2bid with 2 week sample only (unable to verify she used it) - FENO 04/07/2016= 43 -CTa CHest 2017 >neg PE . 4 mm RUL nodule  CT sinus 2016 negGEN IgE 87 2017,positive Rast grass, dust, tree High-resolution CT chest April 2018--NEGfor ILD, stable 4 mm right upper lobe nodule. Echo 07/2017 EF 65%,Gr 2 DD , PAP 35mmHg  Spirometry 08/2017 Moderate restriction , no obstruction practice ~  06/28/2020 Follow up : Asthma, post hospital follow-up, COVID-19 Patient presents for a follow-up visit.  She was last seen February 26, 2020.  Patient has underlying asthma.  Is maintained on budesonide and Perforomist nebulizers twice daily.  Reports only taking once daily . Discussed correct dosing .  She has been doing well up until earlier this month.  She developed cold-like symptoms.  She was called in antibiotics and steroid taper without significant improvement in symptoms.  She was admitted February 16 through February 19 with asthma exacerbation, COVID-19 infection.  She was treated with remdesivir for 3 days.  Chest x-ray shows showed no signs of pneumonia Since discharge patient is feeling better she has decreased cough and congestion. Denies any fever hemoptysis chest pain orthopnea calf pain or  swelling Feels that her breathing is back to baseline with no increased albuterol use. Lost taste, has not come back yet.   Has had increased GERD , following with GI .   Allergies  Allergen Reactions  . Tramadol Other (See Comments)    Seizure d/o    Immunization History  Administered Date(s) Administered  . Fluad Quad(high Dose 65+) 02/06/2020  . Influenza Split 03/10/2012, 03/14/2015  . Influenza, High Dose Seasonal PF 02/04/2017, 01/04/2018, 12/31/2018  . Influenza-Unspecified 01/11/2016, 01/07/2019  . PFIZER(Purple Top)SARS-COV-2 Vaccination 05/16/2019, 06/16/2019, 02/06/2020  . Zoster Recombinat (Shingrix) 06/11/2017, 08/28/2017, 09/27/2017    Past Medical History:  Diagnosis Date  . Anxiety   . Asthma   . Bronchitis   . Chronic diastolic CHF (congestive heart failure) ([place]) 07/17/2016  . Coronary artery disease    a. NSTEMI 12/2015 - LHC 01/18/16: s/p overlapping DESx2 to RCA, 10% ost-prox Cx,10% mLAD.  Marland Kitchen Depression   . Diabetes mellitus   . Dyslipidemia   . GERD (gastroesophageal reflux disease)   . Heart attack ([place])   . Hypertension   . Hypertensive heart disease   . NSTEMI (non-ST elevated myocardial infarction) ([place]) 12/2015  . Pneumonia     Tobacco History: Social History   Tobacco Use  Smoking Status Former Smoker  . Packs/day: 1.00  . Years: 30.00  . Pack years: 30.00  . Types: Cigarettes  . Quit date: 01/09/2004  . Years since quitting: 16.4  Smokeless Tobacco Never Used   Counseling given: Not Answered   Outpatient Medications Prior to Visit  Medication Sig Dispense Refill  . albuterol (PROAIR HFA) 108 (  90 Base) MCG/ACT inhaler Inhale 2 puffs into the lungs every 4 (four) hours as needed for wheezing or shortness of breath. 18 g 5  . ALPRAZolam (XANAX) 0.5 MG tablet Take 1 tablet (0.5 mg total) by mouth 2 (two) times daily as needed for anxiety. 30 tablet 0  . atorvastatin (LIPITOR) 40 MG tablet Take 40 mg by mouth at bedtime.    Marland Kitchen  azithromycin (ZITHROMAX) 500 MG tablet Take 1 tablet (500 mg total) by mouth daily. 2 tablet 0  . Biotin 5000 MCG CAPS Take 1 capsule by mouth daily.    . budesonide (PULMICORT) 0.5 MG/2ML nebulizer solution Take 2 mLs (0.5 mg total) by nebulization in the morning and at bedtime. 60 mL 11  . chlorpheniramine-HYDROcodone (TUSSIONEX) 10-8 MG/5ML SUER Take 5 mLs by mouth every 12 (twelve) hours as needed for cough. 70 mL 0  . chlorthalidone (HYGROTON) 25 MG tablet Take 1 tablet (25 mg total) by mouth daily. 30 tablet 11  . diltiazem (CARTIA XT) 240 MG 24 hr capsule Take 1 capsule (240 mg total) by mouth daily. 90 capsule 3  . divalproex (DEPAKOTE ER) 500 MG 24 hr tablet Take 2 tablets (1,000 mg total) by mouth at bedtime. 90 tablet 4  . esomeprazole (NEXIUM) 40 MG capsule Take 40 mg by mouth daily at 12 noon.    . famotidine (PEPCID) 20 MG tablet Take 1 tablet (20 mg total) by mouth daily. 30 tablet 0  . formoterol (PERFOROMIST) 20 MCG/2ML nebulizer solution Take 2 mLs (20 mcg total) by nebulization 2 (two) times daily. 120 mL 11  . hydrALAZINE (APRESOLINE) 50 MG tablet Take 1 tablet (50 mg total) by mouth 2 (two) times daily. 270 tablet 3  . latanoprost (XALATAN) 0.005 % ophthalmic solution     . memantine (NAMENDA) 10 MG tablet Take 1 tablet (10 mg total) by mouth 2 (two) times daily. 60 tablet 11  . metFORMIN (GLUCOPHAGE) 500 MG tablet Take 500 mg by mouth 2 (two) times daily with a meal.    . montelukast (SINGULAIR) 10 MG tablet TAKE 1 TABLET BY MOUTH AT BEDTIME 30 tablet 5  . Multiple Vitamin (MULTIVITAMIN WITH MINERALS) TABS tablet Take 1 tablet by mouth daily.    . nebivolol (BYSTOLIC) 10 MG tablet Take 1 tablet (10 mg total) by mouth daily. 34 tablet 11  . nitroGLYCERIN (NITROSTAT) 0.4 MG SL tablet DISSOLVE ONE TABLET UNDER THE TONGUE EVERY 5 MINUTES AS NEEDED FOR CHEST PAIN.  DO NOT EXCEED A TOTAL OF 3 DOSES IN 15 MINUTES 25 tablet 0  . potassium chloride SA (K-DUR,KLOR-CON) 20 MEQ tablet  Take 20 mEq by mouth daily.     . predniSONE (DELTASONE) 10 MG tablet Take 40 mg daily for 1 day, 30 mg daily for 1 day, 20 mg daily for 1 days,10 mg daily for 1 day, then stop 10 tablet 0  . Respiratory Therapy Supplies (FLUTTER) DEVI by Does not apply route.    . torsemide (DEMADEX) 20 MG tablet Take 20 mg by mouth daily.    Marland Kitchen albuterol (PROVENTIL) (2.5 MG/3ML) 0.083% nebulizer solution Take 2.5 mg by nebulization every 4 (four) hours as needed for wheezing or shortness of breath.  (Patient not taking: Reported on 06/28/2020)    . traMADol (ULTRAM) 50 MG tablet Take 50 mg by mouth every 4 (four) hours as needed. (Patient not taking: Reported on 06/28/2020)     No facility-administered medications prior to visit.     Review of Systems:  Constitutional:   No  weight loss, night sweats,  Fevers, chills,  +fatigue, or  lassitude.  HEENT:   No headaches,  Difficulty swallowing,  Tooth/dental problems, or  Sore throat,                No sneezing, itching, ear ache, nasal congestion, post nasal drip,   CV:  No chest pain,  Orthopnea, PND, swelling in lower extremities, anasarca, dizziness, palpitations, syncope.   GI  No heartburn, indigestion, abdominal pain, nausea, vomiting, diarrhea, change in bowel habits, loss of appetite, bloody stools.   Resp:No excess mucus, no productive cough,  No non-productive cough,  No coughing up of blood.  No change in color of mucus.  No wheezing.  No chest wall deformity  Skin: no rash or lesions.  GU: no dysuria, change in color of urine, no urgency or frequency.  No flank pain, no hematuria   MS:  No joint pain or swelling.  No decreased range of motion.  No back pain.    Physical Exam  BP 132/74 (BP Location: Left Arm, Cuff Size: Normal)   Pulse 68   Temp 97.8 F (36.6 C)   Ht 5\' 1"  (1.549 m)   Wt 147 lb (66.7 kg)   LMP  (LMP Unknown)   SpO2 98%   BMI 27.78 kg/m   GEN: A/Ox3; pleasant , NAD, well nourished    HEENT:  [place]/AT,     NOSE-clear, THROAT-clear, no lesions, no postnasal drip or exudate noted.   NECK:  Supple w/ fair ROM; no JVD; normal carotid impulses w/o bruits; no thyromegaly or nodules palpated; no lymphadenopathy.    RESP  Clear  P & A; w/o, wheezes/ rales/ or rhonchi. no accessory muscle use, no dullness to percussion  CARD:  RRR, no m/r/g, no peripheral edema, pulses intact, no cyanosis or clubbing.  GI:   Soft & nt; nml bowel sounds; no organomegaly or masses detected.   Musco: Warm bil, no deformities or joint swelling noted.   Neuro: alert, no focal deficits noted.    Skin: Warm, no lesions or rashes    Lab Results:     Imaging: DG Chest Portable 1 View  Result Date: 06/16/2020 CLINICAL DATA:  Shortness of breath. EXAM: PORTABLE CHEST 1 VIEW COMPARISON:  04/30/2020. FINDINGS: Mediastinum and hilar structures normal. Lungs are clear. No pleural effusion or pneumothorax. Heart size normal. Degenerative changes scoliosis thoracic spine. IMPRESSION: No acute cardiopulmonary disease. Electronically Signed   By: Marcello Moores  Register   On: 06/16/2020 06:11   MYOCARDIAL PERFUSION IMAGING  Result Date: 05/31/2020  Nuclear stress EF: 75%.  There was no ST segment deviation noted during stress.  The study is normal.  This is a low risk study.  The left ventricular ejection fraction is hyperdynamic (>65%).  There is significant bowel radioisotope uptake in the vicinity of the inferior LV wall, without evidence of perfusion defect. No ischemia or infarction on perfusion imaging.    adenosine (diagnostic) (ADENOSCAN) infusion 36.9 mg    Date Action Dose Route User   Discharged on 06/19/2020   Admitted on 06/16/2020   05/31/2020 0920 Given 36.9 mg Intravenous Ines Bloomer    technetium tetrofosmin (TC-MYOVIEW) injection 9.1 millicurie    Date Action Dose Route User   Discharged on 06/19/2020   Admitted on 06/16/2020   05/31/2020 0750 Contrast Given 9.1 millicurie Intravenous Annye Rusk M     technetium tetrofosmin (TC-MYOVIEW) injection 26 millicurie    Date Action Dose Route  User   Discharged on 06/19/2020   Admitted on 06/16/2020   05/31/2020 0920 Contrast Given 26 millicurie Intravenous Annye Rusk M      PFT Results Latest Ref Rng & Units 04/18/2018  FVC-Pre L 1.19  FVC-Predicted Pre % 55  FVC-Post L 1.25  FVC-Predicted Post % 58  Pre FEV1/FVC % % 89  Post FEV1/FCV % % 91  FEV1-Pre L 1.05  FEV1-Predicted Pre % 64  FEV1-Post L 1.13  DLCO uncorrected ml/min/mmHg 11.21  DLCO UNC% % 53  DLCO corrected ml/min/mmHg 12.60  DLCO COR %Predicted % 60  DLVA Predicted % 147  TLC L 2.73  TLC % Predicted % 58  RV % Predicted % 64    Lab Results  Component Value Date   NITRICOXIDE 18 11/16/2017        Assessment & Plan:   Asthma exacerbation Recent flare with Covid 19 - now improved  Encouraged to take maintenance as scheduled   Plan  Patient Instructions  Take Budesonide and Perforomist nebulizer twice daily, rinse after use  Albuterol inhaler or nebulizer as needed Continue on Singulair daily Activity as tolerated Follow-up in 6 to 8 weeks and as needed with Dr. Melvyn Novas.  .futo      Acute respiratory disease due to COVID-19 virus Recent with hospitalization -improved after treatment with Remdesivir x 3 days  , and steroids  Fully vaccinated, appears back to baseline except for loss of taste    Plan  Patient Instructions  Take Budesonide and Perforomist nebulizer twice daily, rinse after use  Albuterol inhaler or nebulizer as needed Continue on Singulair daily Activity as tolerated Follow-up in 6 to 8 weeks and as needed with Dr. Melvyn Novas.  .futo      G E R D Flare -cont with GI follow up and GERD diet and Tx regimen      Rexene Edison, NP 06/28/2020

## 2020-06-28 NOTE — Assessment & Plan Note (Signed)
Recent flare with Covid 19 - now improved  Encouraged to take maintenance as scheduled   Plan  Patient Instructions  Take Budesonide and Perforomist nebulizer twice daily, rinse after use  Albuterol inhaler or nebulizer as needed Continue on Singulair daily Activity as tolerated Follow-up in 6 to 8 weeks and as needed with Dr. Melvyn Novas.  .futo

## 2020-06-28 NOTE — Assessment & Plan Note (Signed)
Recent with hospitalization -improved after treatment with Remdesivir x 3 days  , and steroids  Fully vaccinated, appears back to baseline except for loss of taste    Plan  Patient Instructions  Take Budesonide and Perforomist nebulizer twice daily, rinse after use  Albuterol inhaler or nebulizer as needed Continue on Singulair daily Activity as tolerated Follow-up in 6 to 8 weeks and as needed with [name].  .futo

## 2020-06-28 NOTE — Patient Instructions (Addendum)
Take Budesonide and Perforomist nebulizer twice daily, rinse after use  Albuterol inhaler or nebulizer as needed Continue on Singulair daily Activity as tolerated Follow-up in 6 to 8 weeks and as needed with Dr. Melvyn Novas.  .futo

## 2020-06-30 ENCOUNTER — Ambulatory Visit
Admission: RE | Admit: 2020-06-30 | Discharge: 2020-06-30 | Disposition: A | Discharge status: Home / Self Care | Payer: Medicare Other | Source: Ambulatory Visit | Attending: Physician Assistant | Admitting: Physician Assistant

## 2020-06-30 DIAGNOSIS — K2100 Gastro-esophageal reflux disease with esophagitis, without bleeding: Secondary | ICD-10-CM

## 2020-06-30 DIAGNOSIS — R1319 Other dysphagia: Secondary | ICD-10-CM

## 2020-07-05 ENCOUNTER — Telehealth: Payer: Self-pay | Admitting: Internal Medicine

## 2020-07-05 NOTE — Telephone Encounter (Signed)
Attempted to call pt but line went straight to VM. Left message for her to return call. 

## 2020-07-06 NOTE — Telephone Encounter (Signed)
Called and spoke to pt. Pt states she been sneezing and has PND. Pt states she picked up the nasonex and states she will try this. Advised pt she can also take an OTC antihistamine to help with the seasonal allergies. Pt verbalized understanding. Pt aware to call back if she becomes SOB, wheezing, f/c/s, CP/tightness. Pt has OV with MW on 08/09/20, pt aware to keep appt. Nothing further needed at this time.   Will forward to Dr. Melvyn Novas as Juluis Rainier.

## 2020-07-07 ENCOUNTER — Telehealth: Payer: Self-pay | Admitting: Cardiology

## 2020-07-07 NOTE — Telephone Encounter (Signed)
Pt c/o BP issue: STAT if pt c/o blurred vision, one-sided weakness or slurred speech  1. What are your last 5 BP readings?  07/03/20:  159/74  07/04/20: 147/73 07/05/20: 147/71 07/06/20: 150/70 07/07/20: 154/73  2. Are you having any other symptoms (ex. Dizziness, headache, blurred vision, passed out)? no  3. What is your BP issue? Pt was d/c from the hospital 06/19/20.  During her hospital stay she was taken off of her aspirin and her fluid pill. Since she has been home she has noticed her BP was starting to go up. She saw her PCP and was told to stay off of her fluid pill unless she starts swelling. She was not sure what Dr. Marlou Porch wants her to do

## 2020-07-07 NOTE — Telephone Encounter (Signed)
Returned pts call-left message to call back.  

## 2020-07-08 NOTE — Telephone Encounter (Signed)
Per pt B/P is creeping up see below readings no other symptoms no swelling noted since stopping Furosemide .Will continue to keep log Appt made for 08-05-20 at 11:20 . Will forward to Dr Marlou Porch for review  cy

## 2020-07-08 NOTE — Telephone Encounter (Signed)
Lm to call back ./cy 

## 2020-07-08 NOTE — Telephone Encounter (Signed)
Patient returning call.

## 2020-07-19 ENCOUNTER — Inpatient Hospital Stay: Payer: Medicare Other | Admitting: Adult Health

## 2020-07-30 ENCOUNTER — Other Ambulatory Visit: Payer: Self-pay | Admitting: Neurology

## 2020-08-04 ENCOUNTER — Encounter: Payer: Self-pay | Admitting: Internal Medicine

## 2020-08-05 ENCOUNTER — Other Ambulatory Visit: Payer: Self-pay

## 2020-08-05 ENCOUNTER — Ambulatory Visit: Payer: Medicare Other | Admitting: Cardiology

## 2020-08-05 ENCOUNTER — Encounter: Payer: Self-pay | Admitting: Cardiology

## 2020-08-05 VITALS — BP 130/70 | HR 76 | Ht 61.0 in | Wt 144.0 lb

## 2020-08-05 DIAGNOSIS — I10 Essential (primary) hypertension: Secondary | ICD-10-CM

## 2020-08-05 DIAGNOSIS — I2510 Atherosclerotic heart disease of native coronary artery without angina pectoris: Secondary | ICD-10-CM | POA: Diagnosis not present

## 2020-08-05 NOTE — Progress Notes (Signed)
Cardiology Office Note:    Date:  08/05/2020   ID:  [name], DOB February 11, 1951, MRN [id number]  PCP:  [name], [place]  Cardiologist:  Candee Furbish, MD  Advanced Practice Provider:  No care team member to display Electrophysiologist:  None       Referring MD: [name], MD     History of Present Illness:    [name] is a 70 y.o. female here for the follow-up of coronary disease, prior non-ST elevation myocardial infarction, PE, stage IIIa chronic kidney disease, hypertension.  She has been watched closely by the hypertension clinic.  Challenging blood pressures.  See below for details,   Previously tried: Irbesartan, valsartan, valsartan/HCTZ,(thought to have stopped ACE and ARB due to cough and asthma symptoms)metoprolol succinate, amlodipine previously on allergy list (unsure why, but maybe swelling)  Blood pressure continues to still be high.  At hypertension visit she was asked to start chlorthalidone but she was hesitant.  She is also hesitant to retry ACE inhibitor's or ARBs because of the cough.  They did try to start chlorthalidone 12.5 mg a day.  She revisited with him and it was determined to continue with  chlorthalidone 12.5 Hydralazine 25 3 times a day Nebivolol 10 mg a day Diltiazem 240 mg a day Torsemide 20 mg a day (stopped) too much hypokalemia Off ASA with GERD improved.   Overall she is doing quite well.  No chest pain no shortness of breath no syncope no fevers no chills.  She is compliant with her medications.  She wishes that she could take fewer medicines however.  Thankfully, she is off of the torsemide which caused hypokalemia and off of the aspirin which caused GERD.  Past Medical History:  Diagnosis Date  . Anxiety   . Asthma   . Bronchitis   . Chronic diastolic CHF (congestive heart failure) ([place]) 07/17/2016  . Coronary artery disease    a. NSTEMI 12/2015 - LHC 01/18/16: s/p overlapping  DESx2 to RCA, 10% ost-prox Cx,10% mLAD.  Marland Kitchen Depression   . Diabetes mellitus   . Dyslipidemia   . GERD (gastroesophageal reflux disease)   . Heart attack ([place])   . Hypertension   . Hypertensive heart disease   . NSTEMI (non-ST elevated myocardial infarction) ([place]) 12/2015  . Pneumonia     Past Surgical History:  Procedure Laterality Date  . ABDOMINAL HYSTERECTOMY    . CARDIAC CATHETERIZATION  1994   minimal LAD dz, no other dz, EF normal  . CARDIAC CATHETERIZATION N/A 01/18/2016   Procedure: Left Heart Cath and Coronary Angiography;  Surgeon: Burnell Blanks, MD;  Location: [facility];  Service: Cardiovascular;  Laterality: N/A;  . CARDIAC CATHETERIZATION N/A 01/18/2016   Procedure: Coronary Stent Intervention;  Surgeon: Burnell Blanks, MD;  Location: [facility];  Service: Cardiovascular;  Laterality: N/A;  . COLONOSCOPY WITH PROPOFOL N/A 04/09/2014   Procedure: COLONOSCOPY WITH PROPOFOL;  Surgeon: Cleotis Nipper, MD;  Location: [facility];  Service: Endoscopy;  Laterality: N/A;  . CORONARY STENT PLACEMENT  01/19/2016   STENT SYNERGY DES [id number] drug eluting stent was successfully placed, and overlaps the 2.5 x 38 mm Synergy stent placed distally.  . ESOPHAGOGASTRODUODENOSCOPY (EGD) WITH PROPOFOL N/A 04/09/2014   Procedure: ESOPHAGOGASTRODUODENOSCOPY (EGD) WITH PROPOFOL;  Surgeon: Cleotis Nipper, MD;  Location: [facility];  Service: Endoscopy;  Laterality: N/A;  . HEMORRHOID SURGERY    . HERNIA REPAIR    .  KNEE ARTHROSCOPY      Current Medications: Current Meds  Medication Sig  . albuterol (PROAIR HFA) 108 (90 Base) MCG/ACT inhaler Inhale 2 puffs into the lungs every 4 (four) hours as needed for wheezing or shortness of breath.  Marland Kitchen albuterol (PROVENTIL) (2.5 MG/3ML) 0.083% nebulizer solution Take 2.5 mg by nebulization every 4 (four) hours as needed for wheezing or shortness of breath.  . ALPRAZolam (XANAX) 0.5 MG tablet Take 1 tablet (0.5 mg  total) by mouth 2 (two) times daily as needed for anxiety.  Marland Kitchen atorvastatin (LIPITOR) 40 MG tablet Take 40 mg by mouth at bedtime.  . Biotin 5000 MCG CAPS Take 1 capsule by mouth daily.  . budesonide (PULMICORT) 0.5 MG/2ML nebulizer solution Take 2 mLs (0.5 mg total) by nebulization in the morning and at bedtime.  . chlorthalidone (HYGROTON) 25 MG tablet Take 1 tablet (25 mg total) by mouth daily.  Marland Kitchen diltiazem (CARTIA XT) 240 MG 24 hr capsule Take 1 capsule (240 mg total) by mouth daily.  . divalproex (DEPAKOTE ER) 500 MG 24 hr tablet Take 2 tablets (1,000 mg total) by mouth at bedtime.  Marland Kitchen esomeprazole (NEXIUM) 40 MG capsule Take 40 mg by mouth daily at 12 noon.  . formoterol (PERFOROMIST) 20 MCG/2ML nebulizer solution Take 2 mLs (20 mcg total) by nebulization 2 (two) times daily.  . hydrALAZINE (APRESOLINE) 50 MG tablet Take 1 tablet (50 mg total) by mouth 2 (two) times daily.  Marland Kitchen latanoprost (XALATAN) 0.005 % ophthalmic solution   . memantine (NAMENDA) 10 MG tablet Take 1 tablet by mouth twice daily  . metFORMIN (GLUCOPHAGE) 500 MG tablet Take 500 mg by mouth 2 (two) times daily with a meal.  . montelukast (SINGULAIR) 10 MG tablet TAKE 1 TABLET BY MOUTH AT BEDTIME  . Multiple Vitamin (MULTIVITAMIN WITH MINERALS) TABS tablet Take 1 tablet by mouth daily.  . nebivolol (BYSTOLIC) 10 MG tablet Take 1 tablet (10 mg total) by mouth daily.  . nitroGLYCERIN (NITROSTAT) 0.4 MG SL tablet DISSOLVE ONE TABLET UNDER THE TONGUE EVERY 5 MINUTES AS NEEDED FOR CHEST PAIN.  DO NOT EXCEED A TOTAL OF 3 DOSES IN 15 MINUTES  . potassium chloride SA (K-DUR,KLOR-CON) 20 MEQ tablet Take 20 mEq by mouth daily.   Marland Kitchen Respiratory Therapy Supplies (FLUTTER) DEVI by Does not apply route.     Allergies:   Tramadol   Social History   Socioeconomic History  . Marital status: Married    Spouse name: Not on file  . Number of children: 3  . Years of education: some college  . Highest education level: Not on file   Occupational History  . Occupation: Nutritional therapist  Tobacco Use  . Smoking status: Former Smoker    Packs/day: 1.00    Years: 30.00    Pack years: 30.00    Types: Cigarettes    Quit date: 01/09/2004    Years since quitting: 16.5  . Smokeless tobacco: Never Used  Vaping Use  . Vaping Use: Never used  Substance and Sexual Activity  . Alcohol use: No    Alcohol/week: 0.0 standard drinks  . Drug use: No  . Sexual activity: Not on file  Other Topics Concern  . Not on file  Social History Narrative   Pt lives with husband in [place].   Right-handed.   2 cups caffeine per day.   Social Determinants of Health   Financial Resource Strain: Not on file  Food Insecurity: Not on file  Transportation Needs: Not on  file  Physical Activity: Not on file  Stress: Not on file  Social Connections: Not on file     Family History: The patient's family history includes Allergies in her mother; Asthma in her mother; CAD (age of onset: 77) in her father; CAD (age of onset: 34) in her sister; Congestive Heart Failure in her sister; Diabetes in her father; Hypertension in her father.  ROS:   Please see the history of present illness.     All other systems reviewed and are negative.  EKGs/Labs/Other Studies Reviewed:    The following studies were reviewed today:  ECHO 2019:  - Left ventricle: The cavity size was normal. There was moderate  concentric hypertrophy. Systolic function was vigorous. The  estimated ejection fraction was in the range of 65% to 70%. Wall  motion was normal; there were no regional wall motion  abnormalities. Features are consistent with a pseudonormal left  ventricular filling pattern, with concomitant abnormal relaxation  and increased filling pressure (grade 2 diastolic dysfunction).  Doppler parameters are consistent with high ventricular filling  pressure.  - Mitral valve: There was trivial regurgitation.  - Pulmonary arteries: PA peak  pressure: 34 mm Hg (S).    Recent Labs: 06/19/2020: ALT 20; BUN 36; Creatinine, Ser 1.56; Hemoglobin 11.0; Magnesium 2.2; Platelets 226; Potassium 3.6; Sodium 137  Recent Lipid Panel    Component Value Date/Time   CHOL 196 07/18/2016 0142   TRIG 203 (H) 07/18/2016 0142   HDL 48 07/18/2016 0142   CHOLHDL 4.1 07/18/2016 0142   VLDL 41 (H) 07/18/2016 0142   LDLCALC 107 (H) 07/18/2016 0142     Risk Assessment/Calculations:      Physical Exam:    VS:  BP 130/70 (BP Location: Left Arm, Patient Position: Sitting, Cuff Size: Normal)   Pulse 76   Ht 5\' 1"  (1.549 m)   Wt 144 lb (65.3 kg)   LMP  (LMP Unknown)   SpO2 98%   BMI 27.21 kg/m     Wt Readings from Last 3 Encounters:  08/05/20 144 lb (65.3 kg)  06/28/20 147 lb (66.7 kg)  06/19/20 146 lb 6.2 oz (66.4 kg)     GEN:  Well nourished, well developed in no acute distress HEENT: Normal NECK: No JVD; No carotid bruits LYMPHATICS: No lymphadenopathy CARDIAC: RRR, no murmurs, rubs, gallops RESPIRATORY:  Clear to auscultation without rales, wheezing or rhonchi  ABDOMEN: Soft, non-tender, non-distended MUSCULOSKELETAL:  No edema; No deformity  SKIN: Warm and dry NEUROLOGIC:  Alert and oriented x 3 PSYCHIATRIC:  Normal affect   ASSESSMENT:    1. Coronary artery disease involving native heart without angina pectoris, unspecified vessel or lesion type   2. Essential hypertension    PLAN:    In order of problems listed above:  Coronary artery disease -Prior myocardial infarction in 2017 -2 stents placed in the RCA -Continue with goal-directed medical therapy aspirin statin Bystolic.  Aggressive secondary prevention.  Essential hypertension -Appreciate assistance and hypertension clinic -Medications reviewed.  Chronic kidney disease stage IIIb -continue current plan.  No NSAIDs.  LDL 138 hemoglobin A1c 6.4 creatinine 1.6     Medication Adjustments/Labs and Tests Ordered: Current medicines are reviewed at  length with the patient today.  Concerns regarding medicines are outlined above.  No orders of the defined types were placed in this encounter.  No orders of the defined types were placed in this encounter.   Patient Instructions  Medication Instructions:  The current medical regimen is effective;  continue present plan and medications.  *If you need a refill on your cardiac medications before your next appointment, please call your pharmacy*  Follow-Up: At [facility], you and your health needs are our priority.  As part of our continuing mission to provide you with exceptional heart care, we have created designated Provider Care Teams.  These Care Teams include your primary Cardiologist (physician) and Advanced Practice Providers (APPs -  Physician Assistants and Nurse Practitioners) who all work together to provide you with the care you need, when you need it.  We recommend signing up for the patient portal called "MyChart".  Sign up information is provided on this After Visit Summary.  MyChart is used to connect with patients for Virtual Visits (Telemedicine).  Patients are able to view lab/test results, encounter notes, upcoming appointments, etc.  Non-urgent messages can be sent to your provider as well.   To learn more about what you can do with MyChart, go to [email or web address].    Your next appointment:   6 month(s)  The format for your next appointment:   In Person  Provider:   Candee Furbish, MD  Thank you for choosing [facility]!!        Signed, Candee Furbish, MD  08/05/2020 11:55 AM    [place]

## 2020-08-05 NOTE — Patient Instructions (Signed)

## 2020-08-09 ENCOUNTER — Ambulatory Visit: Payer: Medicare Other | Admitting: Internal Medicine

## 2020-08-20 ENCOUNTER — Other Ambulatory Visit: Payer: Self-pay | Admitting: *Deleted

## 2020-08-20 DIAGNOSIS — J4550 Severe persistent asthma, uncomplicated: Secondary | ICD-10-CM

## 2020-08-20 DIAGNOSIS — J301 Allergic rhinitis due to pollen: Secondary | ICD-10-CM

## 2020-08-20 MED ORDER — MONTELUKAST SODIUM 10 MG PO TABS: 1.0000 | ORAL_TABLET | Freq: Every day | ORAL | 5 refills | Status: DC

## 2020-08-26 ENCOUNTER — Ambulatory Visit: Payer: Medicare Other | Admitting: Neurology

## 2020-08-26 ENCOUNTER — Encounter: Payer: Self-pay | Admitting: Neurology

## 2020-08-26 VITALS — BP 157/85 | HR 81 | Ht 62.0 in | Wt 150.0 lb

## 2020-08-26 DIAGNOSIS — R569 Unspecified convulsions: Secondary | ICD-10-CM

## 2020-08-26 DIAGNOSIS — R413 Other amnesia: Secondary | ICD-10-CM

## 2020-08-26 MED ORDER — DIVALPROEX SODIUM ER 500 MG PO TB24: 500.0000 mg | ORAL_TABLET | Freq: Every day | ORAL | 4 refills | Status: DC

## 2020-08-26 MED ORDER — MEMANTINE HCL 10 MG PO TABS: 10.0000 mg | ORAL_TABLET | Freq: Two times a day (BID) | ORAL | 4 refills | Status: DC

## 2020-08-26 NOTE — Patient Instructions (Signed)
Continue current medications Call for seizures Recommend exercise Follow-up in 1 year

## 2020-08-26 NOTE — Progress Notes (Signed)
HISTORY OF PRESENT ILLNESS:[name] a 70 years old left-handed female, seen in refer by her primary care doctor [name] for evaluation of episodes of confusion on January 27 2016.  I reviewed and summarized the referring note, she had a history of type 2 diabetes, hypertension, hyperlipidemia, pulmonary emboli and, depression, COPD, coronary artery disease, stent placement following a heart attack in January 18 2016, I reviewed echo report on January 18 2016, ejection fraction 65-70%, severe concentric hypertrophy, septal wall thickness was increased, with mild hypertrophy of the posterior wall, abnormal relaxation, increased filling pressure.  Laboratory evaluation BMP showed elevated glucose 270, creatinine 1.06, cholesterol 207, LDL 110, A1c 7.0,  She had her first generalized seizure in 2011, was taken to [facility] evaluated by neurologist there, per patient, there was a suggestion of antiepileptic medications, however it was never followed through.  Over the years, she had no generalized seizure, but every few weeks to every few months, she would have episodes of sudden onset confusion, space out, lasting less than 5 minutes  She went through extreme stress (her grandson died)in 05-10-15, since then, she began to have increased spells, almost daily basis now, she noted when she was reading, cooking, has transient time elapsed, she came to confused, sometimes preceded by nausea dry mouth sensation. She denies family history of seizure, she works as a Midwife at [employer].  UPDATE May 11 2016:Jacqueline She is now taking Depakote ER 500 mg every night, which has helped her tremendously, she no longer has recurrent spells, EEG was normal in October 2017 We have personally reviewed MRI of the brain in November 2017, multiple periventricular and subcortical punctate scattered chronic small vessel disease, increased T2 flare hyperintensity in  the mesial temporal lobe, right hippocampus is slightly smaller than the left,  Update March 20, 2019: She had no recurrent seizure, last seizure was in 2016, tolerating Depakote ER 500 mg 2 tablets at nighttime,  Today her main concern is intermittent memory loss, she retired as a Chief Financial Officer recently, she was noted to have difficulty for the store layout since 2019, she denied a family history of dementia.   UPDATE August 2021: She retired recently, no recurrent seizure, personally reviewed MRI of the brain without contrast, generalized atrophy, supratentorial small vessel disease, no change compared to previous scan in 2017, ORI g or due to elevated intracranial pressures.  It appears stable compared to the previous MRI. Laboratory evaluation May 2021, A1c 6.5, BMP showed elevated creatinine 1.5, glucose 307, potassium 3.3, CBC showed mild decreased hemoglobin of 11.1, November 2020, normal TSH, B12, RPR, depakote level 80s.  While taking Depakote ER 500 mg 2 every night  She has some memory complaints, was put on Namenda 10 mg twice a day tolerating it well  Update August 26, 2020 SS: Doing well on lower dose Depakote ER 500 mg at bedtime, no seizures. MMSE 26/30, doing well on Namenda 10 mg twice daily. Living with husband. Driving car, doing everything for herself. For memory, sometimes can't find the words. In hospital in Feb, had COVID, asthma flare. Depakote level was 34 in Feb, creatinine 1.56. No complaints today.   REVIEW OF SYSTEMS: Out of a complete 14 system review of symptoms, the patient complains only of the following symptoms, and all other reviewed systems are negative.  N/A  ALLERGIES: Allergies  Allergen Reactions  . Tramadol Other (See Comments)    Seizure d/o    HOME  MEDICATIONS: Outpatient Medications Prior to Visit  Medication Sig Dispense Refill  . albuterol (PROAIR HFA) 108 (90 Base) MCG/ACT inhaler Inhale 2 puffs into the lungs every 4  (four) hours as needed for wheezing or shortness of breath. 18 g 5  . albuterol (PROVENTIL) (2.5 MG/3ML) 0.083% nebulizer solution Take 2.5 mg by nebulization every 4 (four) hours as needed for wheezing or shortness of breath.    . ALPRAZolam (XANAX) 0.5 MG tablet Take 1 tablet (0.5 mg total) by mouth 2 (two) times daily as needed for anxiety. 30 tablet 0  . atorvastatin (LIPITOR) 40 MG tablet Take 40 mg by mouth at bedtime.    . Biotin 5000 MCG CAPS Take 1 capsule by mouth daily.    . budesonide (PULMICORT) 0.5 MG/2ML nebulizer solution Take 2 mLs (0.5 mg total) by nebulization in the morning and at bedtime. 60 mL 11  . chlorthalidone (HYGROTON) 25 MG tablet Take 1 tablet (25 mg total) by mouth daily. 30 tablet 11  . diltiazem (CARTIA XT) 240 MG 24 hr capsule Take 1 capsule (240 mg total) by mouth daily. 90 capsule 3  . divalproex (DEPAKOTE ER) 500 MG 24 hr tablet Take 2 tablets (1,000 mg total) by mouth at bedtime. 90 tablet 4  . esomeprazole (NEXIUM) 40 MG capsule Take 40 mg by mouth daily at 12 noon.    . formoterol (PERFOROMIST) 20 MCG/2ML nebulizer solution Take 2 mLs (20 mcg total) by nebulization 2 (two) times daily. 120 mL 11  . hydrALAZINE (APRESOLINE) 50 MG tablet Take 1 tablet (50 mg total) by mouth 2 (two) times daily. 270 tablet 3  . latanoprost (XALATAN) 0.005 % ophthalmic solution     . memantine (NAMENDA) 10 MG tablet Take 1 tablet by mouth twice daily 60 tablet 0  . metFORMIN (GLUCOPHAGE) 500 MG tablet Take 500 mg by mouth 2 (two) times daily with a meal.    . montelukast (SINGULAIR) 10 MG tablet Take 1 tablet (10 mg total) by mouth at bedtime. 30 tablet 5  . Multiple Vitamin (MULTIVITAMIN WITH MINERALS) TABS tablet Take 1 tablet by mouth daily.    . nebivolol (BYSTOLIC) 10 MG tablet Take 1 tablet (10 mg total) by mouth daily. 34 tablet 11  . nitroGLYCERIN (NITROSTAT) 0.4 MG SL tablet DISSOLVE ONE TABLET UNDER THE TONGUE EVERY 5 MINUTES AS NEEDED FOR CHEST PAIN.  DO NOT EXCEED A  TOTAL OF 3 DOSES IN 15 MINUTES 25 tablet 0  . potassium chloride SA (K-DUR,KLOR-CON) 20 MEQ tablet Take 20 mEq by mouth daily.     Marland Kitchen Respiratory Therapy Supplies (FLUTTER) DEVI by Does not apply route.     No facility-administered medications prior to visit.    PAST MEDICAL HISTORY: Past Medical History:  Diagnosis Date  . Anxiety   . Asthma   . Bronchitis   . Chronic diastolic CHF (congestive heart failure) ([place]) 07/17/2016  . Coronary artery disease    a. NSTEMI 12/2015 - LHC 01/18/16: s/p overlapping DESx2 to RCA, 10% ost-prox Cx,10% mLAD.  Marland Kitchen Depression   . Diabetes mellitus   . Dyslipidemia   . GERD (gastroesophageal reflux disease)   . Heart attack ([place])   . Hypertension   . Hypertensive heart disease   . NSTEMI (non-ST elevated myocardial infarction) ([place]) 12/2015  . Pneumonia     PAST SURGICAL HISTORY: Past Surgical History:  Procedure Laterality Date  . ABDOMINAL HYSTERECTOMY    . CARDIAC CATHETERIZATION  1994   minimal LAD dz, no other  dz, EF normal  . CARDIAC CATHETERIZATION N/A 01/18/2016   Procedure: Left Heart Cath and Coronary Angiography;  Surgeon: Burnell Blanks, MD;  Location: [facility];  Service: Cardiovascular;  Laterality: N/A;  . CARDIAC CATHETERIZATION N/A 01/18/2016   Procedure: Coronary Stent Intervention;  Surgeon: Burnell Blanks, MD;  Location: [facility];  Service: Cardiovascular;  Laterality: N/A;  . COLONOSCOPY WITH PROPOFOL N/A 04/09/2014   Procedure: COLONOSCOPY WITH PROPOFOL;  Surgeon: Cleotis Nipper, MD;  Location: [facility];  Service: Endoscopy;  Laterality: N/A;  . CORONARY STENT PLACEMENT  01/19/2016   STENT SYNERGY DES 1.06Y69 drug eluting stent was successfully placed, and overlaps the 2.5 x 38 mm Synergy stent placed distally.  . ESOPHAGOGASTRODUODENOSCOPY (EGD) WITH PROPOFOL N/A 04/09/2014   Procedure: ESOPHAGOGASTRODUODENOSCOPY (EGD) WITH PROPOFOL;  Surgeon: Cleotis Nipper, MD;  Location: [facility];  Service: Endoscopy;  Laterality: N/A;  . HEMORRHOID SURGERY    . HERNIA REPAIR    . KNEE ARTHROSCOPY      FAMILY HISTORY: Family History  Problem Relation Age of Onset  . Allergies Mother   . Asthma Mother   . CAD Father 21  . Diabetes Father   . Hypertension Father   . Congestive Heart Failure Sister        died in her 35s  . CAD Sister 51    SOCIAL HISTORY: Social History   Socioeconomic History  . Marital status: Married    Spouse name: Not on file  . Number of children: 3  . Years of education: some college  . Highest education level: Not on file  Occupational History  . Occupation: Nutritional therapist  Tobacco Use  . Smoking status: Former Smoker    Packs/day: 1.00    Years: 30.00    Pack years: 30.00    Types: Cigarettes    Quit date: 01/09/2004    Years since quitting: 16.6  . Smokeless tobacco: Never Used  Vaping Use  . Vaping Use: Never used  Substance and Sexual Activity  . Alcohol use: No    Alcohol/week: 0.0 standard drinks  . Drug use: No  . Sexual activity: Not on file  Other Topics Concern  . Not on file  Social History Narrative   Pt lives with husband in [place].   Right-handed.   2 cups caffeine per day.   Social Determinants of Health   Financial Resource Strain: Not on file  Food Insecurity: Not on file  Transportation Needs: Not on file  Physical Activity: Not on file  Stress: Not on file  Social Connections: Not on file  Intimate Partner Violence: Not on file   PHYSICAL EXAM  Vitals:   08/26/20 0911  BP: (!) 157/85  Pulse: 81  Weight: 150 lb (68 kg)  Height: 5\' 2"  (1.575 m)   Body mass index is 27.44 kg/m.  Generalized: Well developed, in no acute distress  MMSE - Mini Mental State Exam 08/26/2020 06/05/2019 03/20/2019  Not completed: - (No Data) -  Orientation to time 5 5 4   Orientation to Place 5 4 5   Registration 3 3 3   Attention/ Calculation 3 1 1   Recall 1 1 2   Language- name 2 objects 2 2 2   Language-  repeat 1 1 1   Language- follow 3 step command 3 2 3   Language- follow 3 step command-comments - she placed the paper in her left hand after she folded it -  Language- read & follow direction 1 1 1  Write a sentence 1 1 1   Copy design 1 1 1   Total score 26 22 24      NEUROLOGICAL EXAM:  MENTAL STATUS: Speech/Cognition: Awake, alert, normal speech, oriented to history taking and casual conversation.  CRANIAL NERVES: CN II: Visual fields are full to confrontation.  Pupils are round equal and briskly reactive to light. CN III, IV, VI: extraocular movement are normal. No ptosis. CN V: Facial sensation is intact to light touch. CN VII: Face is symmetric with normal eye closure and smile. CN VIII: Hearing is normal to casual conversation CN XI: Head turning and shoulder shrug are intact  MOTOR: Good strength all extremities  REFLEXES: Reflexes are 2+ throughout  SENSORY: Intact to light touch to all extremities  COORDINATION: Finger-nose-finger and heel-to-shin is normal bilaterally  GAIT/STANCE: Posture is normal. Gait is steady with normal steps, base, arm swing and turning.   DIAGNOSTIC DATA (LABS, IMAGING, TESTING) - I reviewed patient records, labs, notes, testing and imaging myself where available.  Lab Results  Component Value Date   WBC 8.4 06/19/2020   HGB 11.0 (L) 06/19/2020   HCT 32.2 (L) 06/19/2020   MCV 94.7 06/19/2020   PLT 226 06/19/2020      Component Value Date/Time   NA 137 06/19/2020 0244   NA 141 02/26/2020 1350   K 3.6 06/19/2020 0244   CL 101 06/19/2020 0244   CO2 24 06/19/2020 0244   GLUCOSE 219 (H) 06/19/2020 0244   BUN 36 (H) 06/19/2020 0244   BUN 31 (H) 02/26/2020 1350   CREATININE 1.56 (H) 06/19/2020 0244   CREATININE 1.06 (H) 01/26/2016 1608   CALCIUM 9.1 06/19/2020 0244   PROT 6.5 06/19/2020 0244   PROT 7.6 05/17/2017 1141   ALBUMIN 3.4 (L) 06/19/2020 0244   ALBUMIN 4.3 05/17/2017 1141   AST 20 06/19/2020 0244   ALT 20 06/19/2020  0244   ALKPHOS 43 06/19/2020 0244   BILITOT 0.6 06/19/2020 0244   BILITOT 0.4 05/17/2017 1141   GFRNONAA 35 (L) 06/19/2020 0244   GFRAA 50 (L) 02/26/2020 1350   Lab Results  Component Value Date   CHOL 196 07/18/2016   HDL 48 07/18/2016   LDLCALC 107 (H) 07/18/2016   TRIG 203 (H) 07/18/2016   CHOLHDL 4.1 07/18/2016   Lab Results  Component Value Date   HGBA1C 6.4 (H) 06/16/2020   Lab Results  Component Value Date   VITAMINB12 901 03/20/2019   Lab Results  Component Value Date   TSH 0.994 03/20/2019   ASSESSMENT AND PLAN 70 y.o. year old female   1. Seizure -Doing well on lower dose Depakote 500 mg ER at bedtime, no recurrent seizures -First generalized seizure was in 2011, multiple recurrent seizure-like spells since 22-May-2015 following sudden death of family member, was under a lot of stress at that time -Has not had recurrent seizure since 2017    2. Memory loss -MMSE was 26/30, improved from 22/30 -Laboratory evaluation showed no treatable etiology -MRI of the brain showed moderate supratentorium small vessel disease, -Emphasized importance of exercise, brain stimulating activities -Continue Namenda 10 mg twice daily -Follow-up in 1 year or sooner if needed  Butler Denmark, Laqueta Jean, DNP  [facility] [address], [place], [place] [address] [phone]

## 2020-09-02 ENCOUNTER — Other Ambulatory Visit: Payer: Self-pay | Admitting: Physician Assistant

## 2020-09-02 ENCOUNTER — Ambulatory Visit
Admission: RE | Admit: 2020-09-02 | Discharge: 2020-09-02 | Disposition: A | Discharge status: Home / Self Care | Payer: Medicare Other | Source: Ambulatory Visit | Attending: Physician Assistant | Admitting: Physician Assistant

## 2020-09-02 DIAGNOSIS — R197 Diarrhea, unspecified: Secondary | ICD-10-CM

## 2020-09-08 ENCOUNTER — Ambulatory Visit: Payer: Medicare Other | Admitting: Internal Medicine

## 2020-09-08 ENCOUNTER — Encounter: Payer: Self-pay | Admitting: Internal Medicine

## 2020-09-08 ENCOUNTER — Other Ambulatory Visit: Payer: Self-pay

## 2020-09-08 DIAGNOSIS — J4530 Mild persistent asthma, uncomplicated: Secondary | ICD-10-CM

## 2020-09-08 NOTE — Patient Instructions (Addendum)
Ok to reduce your perforomist / budesonide once daily as long as doing great - otherwise twice daily   Esomeprazole is Take 30- 60 min before your first and last meals of the day   Please schedule a follow up visit in 6 months but call sooner if needed

## 2020-09-08 NOTE — Progress Notes (Signed)
Subjective:   Patient ID: [name], female    DOB: 29-Jul-1950    MRN: [id number]    Brief patient profile:  70   yobf quit smoking 2005 with nl pfts 2012  with recurrent non-specific resp flares with nl pfts during flares c/w pseudoasthma.     History of Present Illness  01/24/2016  Extended post hosp f/u ov/ transition of care/ /[name] re: dtca asthma/ vcd symb 160 and pred 5 x 10 mg daily  Chief Complaint  Patient presents with  . Follow-up    Pt. recently got out the [facility] for an asthma attack, Pt. states her breathing remains the same, coughing with some yellow to white mucus,Has been needing to use Ventolin more often  last doing well 72 months prior to OV   While on Symb 160/ gerd rx but not dosing ac as rec  Waking up prematurely x 2 months with cough/wheeze/ sob but never contacted me as per the instructions rec Plan A = Automatic = dulera 100 Take 2 puffs first thing in am and then another 2 puffs about 12 hours later.                                      Nexum 40 mg Take 30-60 min before first meal of the day and Pepcid AC 20 mg  at bedtime Work on inhaler technique  Plan B = Backup Only use your albuterol as a rescue medication Plan C = Crisis - only use your albuterol nebulizer if you first try Plan B and it fails to help > ok to use the nebulizer up to every 4 hours but if start needing it regularly call for immediate appointment GERD diet  Take delsym two tsp every 12 hours and use the flutter valve as much as possible and supplement if needed with  tramadol 50 mg up to 2 every 4 hours to suppress the urge to cough  Once you have eliminated the cough for 3 straight days try reducing the tramadol first,  then the delsym as tolerated.   Prednisone 5 mg x 8, then 6, then 4 then 2, then 1 daily x 2 days and off  please schedule a follow up office visit in 2 weeks, sooner if needed with all meds/ inhalers/ neb solutions in hand     09/18/2016  f/u ov/[name] re:  UACS vs  Asthma brought med calendar but not updated / on symb 160 / max gerd rx  Chief Complaint  Patient presents with  . Follow-up    Pt c/o occ wheezing. She has not used rescue inhaler or neb recently.   still feels wheezing  And doe = MMRC1 = can walk nl pace, flat grade, can't hurry or go uphills or steps s sob rec Change the symbicort to 80 Take 2 puffs first thing in am and then another 2 puffs about 12 hours later.  Work on inhaler technique:  Use the med calendar daily to organize your medications as a "checklist" See [name] NP in 4  weeks with all your medications> never happened    Admit date: 12/19/2017 Discharge date: 12/24/2017  D/c home with steroids taper, monitor blood glucose while on steroids She reports run out of pulmicort nebs, new prescription provided, she is to follow up with Dr Melvyn Novas to further discuss asthma/copd meds, she is concerned about cost of these meds  Discharge Diagnoses:      Active Hospital Problems   Diagnosis Date Noted  . Acute bronchitis with asthma with acute exacerbation 01/16/2016  . Acute renal failure superimposed on stage 3 chronic kidney disease ([place]) 07/17/2016  . Chronic diastolic CHF (congestive heart failure) ([place]) 07/17/2016  . Acute respiratory failure with hypoxia ([place])   . Upper airway cough syndrome vs Asthma  08/28/2014  . Diabetes mellitus type 2, controlled ([place]) 08/06/2014  . Essential hypertension 07/21/2008           12/28/2017 ext post hosp f/u ov/[name] re: confused with meds again / only slightly better breathing  Chief Complaint  Patient presents with  . Follow-up    Breathing has improved slightly. She has been out of brovana due to high cost x 3 days. She has had to use her albuterol inhaler and neb daily since then.   Dyspnea:  50 ft = About the same as usual Cough: after supper is the worse, but not much mucus Sleeping: wakes up coughing 30 degrees due to sensation of reflux    SABA use: last 5 h prior to OV    02: none  Sleeping at 30 degrees    rec Plan A = Automatic = Performist 20 mcg one vial  with pulmocort one half vial twice daily  Gabapentin 100 mg three times daily Nexium Take 30- 60 min before your first and last meals of the day and zantac (ranitidine) at bedtime  Along with chlorpheniramine 4 mg 1 or 2 also at bedtime (over the counter)  Continue prednisone 10 mg every day  Plan B = Backup for breathing Only use your albuterol as a rescue medication  Plan B = Backup for coughing  Add tramadol 50 mg 1-2 every 4 hours if needed  Plan C = Crisis - only use your albuterol nebulizer if you first try Plan B and it fails to help > ok to use the nebulizer up to every 4 hours but if start needing it regularly call for immediate appointment     03/11/2018  f/u ov/[name] re: 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 Follow-up    Cough had improved and then started back 1 day ago.  Cough is now non prod.  She is using her albuterol inhaler 2 x daily on average. She has not needed albuterol neb.    Dyspnea:  MMRC2 = can't walk a nl pace on a flat grade s sob but does fine slow and flat slowed by knee  Cough: noct > day  Sleeping: 45 degrees due to subjective reflux despite max acid suppresion  SABA use: as above - never noct 02: none   rec Gabapentin 100 mg four  times daily  Take chortab 4 mg x 2 at bedtime and up to every 4 hours during the day as needed for drainage  Take delsym two tsp every 12 hours and supplement if needed with  tramadol 50 mg up to 2 every 4 hours to suppress the urge to cough. Swallowing water and/or using ice chips/non mint and menthol containing candies (such as lifesavers or sugarless jolly ranchers) are also effective.  You should rest your voice and avoid activities that you know make you cough. Once you have eliminated the cough for 3 straight days try reducing the tramadol first,  then the delsym as tolerated.  Plan D = Prednisone x 6  days if not cough/ sob not better See calendar for specific  medication instructions and bring it back for each and every office visit for every healthcare provider you see.  Without it,  you may not receive the best quality medical care that we feel you deserve. Please schedule a follow up office visit in 4 weeks, call sooner if needed with all medications /inhalers/ solutions in hand so we can verify exactly what you are taking. This includes all medications from all doctors and over the [place] separate them into two bags:  the ones you take automatically, no matter what, vs the ones you take just when you feel you need them "BAG #2 is UP TO YOU"  - this will really help [place] help you take your medications more effectively.    03/15/18 back to ER - did not activate action plan prior (never took more than 1 tramadol every 12 hours to control cough.   03/20/2018  Extended post hops  f/u ov/[name] re: transition of care re   vcd vs asthma  Chief Complaint  Patient presents with  . Follow-up    recent admission 03/15/18- pt reports of wheezing, sob with exertion, no prod cough & chest tightness.   Dyspnea:  MMRC4  = sob if tries to leave home or while getting dressed   - no better now vs during hosp stay. Seen by Dr [place] Bells who strongly supported vcd dx during admit Cough: dry cough settles down overnight and with plb as does the "wheeze"  Sleeping: 45 degrees SABA use: last neb 8 h prior to OV  "did not help" 02: no  rec Increase gabapentin to 100 mg take 2 (=200)  four times daily as per med calendar For cough > delsym 2 tsp every 12 hours with use of flutter valve as much as possible  and supplement with tramadol 50 mg up to 1-2 every 4 hours as needed  For breathing problems not improving with use of nebulizer > Prednisone 10 mg take  4 each am x 2 days,   2 each am x 2 days,  1 each am x 2 days and stop  GERD diet / lifestyle recs Keep previous appt with your medications in 2 bags     04/05/2018  f/u ov/[name] re: uacs/ VCD - brought all meds but confused with gen vs trade names, could not tell me how she is supposed to take esmeprazole when I handed it to her.  Chief Complaint  Patient presents with  . Follow-up  Dyspnea:  50 ft  Cough: better  Sleeping: 45 degrees SABA use: twice a day hfa , rare neb 02: none   rec Gabapentin 300 mg four times daily  Budesonide 0.25 mcg twice daily with performist If breathing worse > Prednisone 10 mg take  4 each am x 2 days,   2 each am x 2 days,  1 each am x 2 days and stop  For cough > Take delsym two tsp every 12 hours and flutter valve as much as possible supplement if needed with  tramadol 50 mg (or tylenol #3) up to 2 every 4 hours to suppress the urge to cough. Swallowing water and/or using ice chips/non mint and menthol containing candies (such as lifesavers or sugarless jolly ranchers) are also effective.  You should rest your voice and avoid activities that you know make you cough. Once you have eliminated the cough for 3 straight days try reducing the tramadol first,  then the delsym as tolerated. Nexium (esmaprazole)  Take 30- 60  min before your first and last meals of the day  Please schedule a follow up office visit in 2  weeks, sooner if needed - needs full pfts on return      04/18/2018  f/u ov/[name] re: asthma vs vcd  - brought most but not all her meds - missing nexium, has budesonide in prn bag/ pill organizer has 2 slots per day and hard to read to top print on each slot Chief Complaint  Patient presents with  . Follow-up    PFT's done today. She c/o increased SOB for the past 3 days. She is using her albuterol inhaler and neb with albuterol both about 2 x per day.   Dyspnea:  Improved, able to do [employer] shopping now  Cough: varies s pattern, min mucoid  Sleeping: 45 degrees all pillows  SABA use: twice daily due to overdoing it at work rec Plan A = Automatic = Budesonide/perforomist twice daily as per med  calendar Plan B = Backup Only use your albuterol inhaler (prefer Proair- RED  but same as Ventolin-blue) as a rescue medication to be used if you can't catch your breath by resting or doing a relaxed purse lip breathing pattern.  - The less you use it, the better it will work when you need it. - Ok to use the inhaler up to 2 puffs  every 4 hours if you must but call for appointment if use goes up over your usual need - Don't leave home without it !!  (think of it like the spare tire for your car)  Plan C = Crisis - only use your albuterol nebulizer if you first try Plan B and it fails to help > ok to use the nebulizer up to every 4 hours but if start needing it regularly call for immediate appointment    02/04/19 televisit Stop gabapentin completely since you are only taking one at bedtime but add it back if start coughing again for any reason and ok to build up to 4 x daily  If your breathing gets worse, increase the Performist to 20 mcg (one vial) every 12 hours instead of just once a day.   03/12/2019  f/u ov/[name] re: asthma with component of vcd / brought med calendar but not keeping it updated  Chief Complaint  Patient presents with  . Follow-up    Breathing is doing well today.  She rarely uses her albuterol inhaler or albuterol neb.    Dyspnea:  Not limited by breathing from desired activities  / walking neighborhood / chasing around gchildren Cough: none/ no need for prns Sleeping: on side 2 pillows SABA use: none / minimal need for prednisone or any prn x xanax 02: none  rec F/u med calendar   09/09/2019  f/u ov/[name] re: asthma /uacs/ ? Adherent (no med calendar /does not recognize the one we gave her dated 01/05/2019  Chief Complaint  Patient presents with  . Follow-up    Breathing is doing well. She rarely uses her albuterol. She has minimal dry cough and wheezing.   Dyspnea:  Walking neighborhood / chasing kids  Cough: usually assoc  with nasal congestion clear mucus   Sleeping: fine on 2pillows on side SABA use: rarely 02: none  rec Automatically when you have a flare of coughing or short of breath >>>  add the second nebulizer treatment 12 hours after the 1st  If not effective >  Prednisone 10 mg take  4 each am x 2 days,  2 each am x 2 days,  1 each am x 2 days and stop     01/12/2020  f/u ov/[name] re:   Asthma with uacs / continues to struggle with concept of med reconciliation Chief Complaint  Patient presents with  . Follow-up    "feeling good"  Dyspnea:  Walking neighborhood, chasing kids at age 9 and 16 Cough: none / still hoarse and clearing throat daytime  Sleeping : no resp symptoms  02 none  rec Plan A = Automatic = Always=    Performist and the budesonide twice daily  And continue esmeprazole 40 mg Take 30-60 min before first meal of the day and 20 mg Take 30-60 min before last meal of the day  Plan B = Backup (to supplement plan A, not to replace it) Only use your albuterol inhaler as a rescue medication Plan C = Crisis (instead of Plan B but only if Plan B stops working) - only use your albuterol nebulizer if you first try Plan B  Plan D = Deltasone if ABC not working  - Prednisone 10 mg take  4 each am x 2 days,   2 each am x 2 days,  1 each am x 2 days and stop     09/08/2020  f/u ov/[name] re: AB/ uacs much better on performist/bud with still poor hfa  Chief Complaint  Patient presents with  . Follow-up    Pt states she has been doing well since last visit. States her cough is gone and denies any current complaints.   Dyspnea:  No limits  Cough: none  Sleeping: ok flat bed/ 2 pillows  SABA use: none  02: none Covid status:   vax x 3    No obvious day to day or daytime variability or assoc excess/ purulent sputum or mucus plugs or hemoptysis or cp or chest tightness, subjective wheeze or overt sinus or hb symptoms.   Sleeping without nocturnal  or early am exacerbation  of respiratory  c/o's or need for noct saba. Also  denies any obvious fluctuation of symptoms with weather or environmental changes or other aggravating or alleviating factors except as outlined above   No unusual exposure hx or h/o childhood pna/ asthma or knowledge of premature birth.  Current Allergies, Complete Past Medical History, Past Surgical History, Family History, and Social History were reviewed in [employer] record.  ROS  The following are not active complaints unless bolded Hoarseness, sore throat, dysphagia, dental problems, itching, sneezing,  nasal congestion or discharge of excess mucus or purulent secretions, ear ache,   fever, chills, sweats, unintended wt loss or wt gain, classically pleuritic or exertional cp,  orthopnea pnd or arm/hand swelling  or leg swelling, presyncope, palpitations, abdominal pain, anorexia, nausea, vomiting, diarrhea  or change in bowel habits or change in bladder habits, change in stools or change in urine, dysuria, hematuria,  rash, arthralgias, visual complaints, headache, numbness, weakness or ataxia or problems with walking or coordination,  change in mood or  memory.        Current Meds  Medication Sig  . albuterol (PROAIR HFA) 108 (90 Base) MCG/ACT inhaler Inhale 2 puffs into the lungs every 4 (four) hours as needed for wheezing or shortness of breath.  Marland Kitchen albuterol (PROVENTIL) (2.5 MG/3ML) 0.083% nebulizer solution Take 2.5 mg by nebulization every 4 (four) hours as needed for wheezing or shortness of breath.  . ALPRAZolam (XANAX) 0.5 MG tablet Take 1 tablet (0.5 mg total)  by mouth 2 (two) times daily as needed for anxiety.  Marland Kitchen atorvastatin (LIPITOR) 40 MG tablet Take 40 mg by mouth at bedtime.  . Biotin 5000 MCG CAPS Take 1 capsule by mouth daily.  . budesonide (PULMICORT) 0.5 MG/2ML nebulizer solution Take 2 mLs (0.5 mg total) by nebulization in the morning and at bedtime.  . chlorthalidone (HYGROTON) 25 MG tablet Take 1 tablet (25 mg total) by mouth daily.  Marland Kitchen diltiazem  (CARTIA XT) 240 MG 24 hr capsule Take 1 capsule (240 mg total) by mouth daily.  . divalproex (DEPAKOTE ER) 500 MG 24 hr tablet Take 1 tablet (500 mg total) by mouth at bedtime.  Marland Kitchen esomeprazole (NEXIUM) 40 MG capsule Take 40 mg by mouth daily at 12 noon.  . fluticasone (FLONASE) 50 MCG/ACT nasal spray Place into both nostrils daily.  . formoterol (PERFOROMIST) 20 MCG/2ML nebulizer solution Take 2 mLs (20 mcg total) by nebulization 2 (two) times daily.  . hydrALAZINE (APRESOLINE) 50 MG tablet Take 1 tablet (50 mg total) by mouth 2 (two) times daily.  Marland Kitchen latanoprost (XALATAN) 0.005 % ophthalmic solution   . loratadine (CLARITIN) 10 MG tablet Take 10 mg by mouth daily as needed for allergies.  . memantine (NAMENDA) 10 MG tablet Take 1 tablet (10 mg total) by mouth 2 (two) times daily.  . metFORMIN (GLUCOPHAGE) 500 MG tablet Take 500 mg by mouth 2 (two) times daily with a meal.  . montelukast (SINGULAIR) 10 MG tablet Take 1 tablet (10 mg total) by mouth at bedtime.  . Multiple Vitamin (MULTIVITAMIN WITH MINERALS) TABS tablet Take 1 tablet by mouth daily.  . nebivolol (BYSTOLIC) 10 MG tablet Take 1 tablet (10 mg total) by mouth daily.  . nitroGLYCERIN (NITROSTAT) 0.4 MG SL tablet DISSOLVE ONE TABLET UNDER THE TONGUE EVERY 5 MINUTES AS NEEDED FOR CHEST PAIN.  DO NOT EXCEED A TOTAL OF 3 DOSES IN 15 MINUTES  . potassium chloride SA (K-DUR,KLOR-CON) 20 MEQ tablet Take 20 mEq by mouth daily.   . Probiotic Product (EQ PROBIOTIC) CAPS Take by mouth.  . Respiratory Therapy Supplies (FLUTTER) DEVI by Does not apply route.                          Objective:   Physical Exam  09/08/2020      153 01/12/2020      150  09/09/2019     147  03/12/2019   157  11/04/2018       157  02/23/2016   168 >  04/07/2016  168 > 05/08/2016    168 >  07/17/2016  162 > 09/18/2016    164  > 09/18/2017  160 > 12/28/2017 167  > 03/11/2018  164 > 03/20/2018   160 > 04/05/2018   171 > 04/18/2018  165     Vital signs reviewed   09/08/2020  - Note at rest 02 sats  98% on RA   General appearance:    amb bf nad    HEENT : pt wearing mask not removed for exam due to covid -19 concerns.    NECK :  without JVD/Nodes/TM/ nl carotid upstrokes bilaterally   LUNGS: no acc muscle use,  Nl contour chest which is clear to A and P bilaterally without cough on insp or exp maneuvers   CV:  RRR  no s3 or murmur or increase in P2, and no edema   ABD:  soft and nontender with nl inspiratory excursion  in the supine position. No bruits or organomegaly appreciated, bowel sounds nl  MS:  Nl gait/ ext warm without deformities, calf tenderness, cyanosis or clubbing No obvious joint restrictions   SKIN: warm and dry without lesions    NEURO:  alert, approp, nl sensorium with  no motor or cerebellar deficits apparent.

## 2020-09-08 NOTE — Assessment & Plan Note (Signed)
Onset of symptoms reported  2012 p quit smoking  2005  - FEN0 01/24/2016 = 13 with active symptoms on symb 160 2bid > changed to dulera 100  - Spirometry 02/23/2016  NO signifiant obstruction with active symptoms  - 02/23/2016  After extensive coaching HFA effectiveness =    75%  - FENO 02/23/2016  =  12   On dulera 100 2bid (unable to confirm adeherence as did not bring it - Singulair 10 mg daily added 02/23/2016 to dulera 100 2bid with 2 week sample only  (unable to verify she used it) - Allergy profile 02/23/16  >  Eos 0.0 /  IgE  87 pos  RAST  Grass/ trees  - 04/07/2016   symb 80 x one sample given - FENO 04/07/2016  =   43 ? Really on meds ?  - 05/08/2016  After extensive coaching HFA effectiveness =    75% > continue symb 80 2bid - 07/17/2016 added singulair maint   - FENO 09/18/2016  =   15 on symb 160 2bid > try 80 2bid  - flare late feb 2019 ? What maint rx she was taking but on admit "non-adherent per notes 08/12/17  - 09/18/2017  After extensive coaching inhaler device  effectiveness =    75% from a baseline of 50% > restart symb 80 2bid - FENO 09/18/2017  =   20 on symb 160  - Spirometry 09/18/2017  No obstruction at all with active wheeze -  09/09/2019 added prednisone as "plan D"  - 09/08/2020  After extensive coaching inhaler device,  effectiveness =    50% from baseline of 25% (delayed breath in)   As long as getting meds approp > All goals of chronic asthma control met including optimal function and elimination of symptoms with minimal need for rescue therapy.  Contingencies discussed in full including contacting this office immediately if not controlling the symptoms using the rule of two's.     F/u can be q 6 m, sooner if needed         Each maintenance medication was reviewed in detail including emphasizing most importantly the difference between maintenance and prns and under what circumstances the prns are to be triggered using an action plan format where appropriate.  Total  time for H and P, chart review, counseling, reviewing hfa  device(s) and generating customized AVS unique to this office visit / same day charting = 22 min       

## 2020-09-13 ENCOUNTER — Other Ambulatory Visit: Payer: Self-pay | Admitting: Family Medicine

## 2020-09-13 DIAGNOSIS — M7989 Other specified soft tissue disorders: Secondary | ICD-10-CM

## 2020-09-14 ENCOUNTER — Other Ambulatory Visit: Payer: Self-pay | Admitting: Family Medicine

## 2020-09-14 ENCOUNTER — Ambulatory Visit
Admission: RE | Admit: 2020-09-14 | Discharge: 2020-09-14 | Disposition: A | Discharge status: Home / Self Care | Payer: Medicare Other | Source: Ambulatory Visit | Attending: Family Medicine | Admitting: Family Medicine

## 2020-09-14 DIAGNOSIS — M7989 Other specified soft tissue disorders: Secondary | ICD-10-CM

## 2020-09-17 ENCOUNTER — Encounter: Payer: Self-pay | Admitting: Podiatry

## 2020-09-17 ENCOUNTER — Ambulatory Visit (INDEPENDENT_AMBULATORY_CARE_PROVIDER_SITE_OTHER): Payer: Medicare Other | Admitting: Podiatry

## 2020-09-17 ENCOUNTER — Other Ambulatory Visit: Payer: Self-pay

## 2020-09-17 DIAGNOSIS — B351 Tinea unguium: Secondary | ICD-10-CM | POA: Diagnosis not present

## 2020-09-17 DIAGNOSIS — M79674 Pain in right toe(s): Secondary | ICD-10-CM

## 2020-09-17 DIAGNOSIS — E119 Type 2 diabetes mellitus without complications: Secondary | ICD-10-CM

## 2020-09-17 DIAGNOSIS — M79675 Pain in left toe(s): Secondary | ICD-10-CM

## 2020-09-21 NOTE — Progress Notes (Signed)
Subjective: [name] is a pleasant 70 y.o. female patient seen today for preventative diabetic foot care for painful thick toenails that are difficult to trim. Pain interferes with ambulation. Aggravating factors include wearing enclosed shoe gear. Pain is relieved with periodic professional debridement.  Patient states she attended her nephew's wedding in [place] and enjoyed herself. She voices no new pedal problems on today's visit.  She states her blood glucose was 117 mg/dl this morning.  PCP is [name], MD. Last visit was: 08/09/2020.  Allergies  Allergen Reactions  . Tramadol Other (See Comments)    Seizure d/o    Objective: Physical Exam  General: [name] is a pleasant 70 y.o. African American female, WD, WN in NAD. AAO x 3.   Vascular:  Capillary refill time to digits immediate b/l. Palpable pedal pulses b/l LE. Pedal hair present. Lower extremity skin temperature gradient within normal limits. No pain with calf compression b/l.  Dermatological:  Pedal skin with normal turgor, texture and tone bilaterally. No open wounds bilaterally. No interdigital macerations bilaterally. Toenails 1-5 b/l elongated, discolored, dystrophic, thickened, crumbly with subungual debris and tenderness to dorsal palpation.  Musculoskeletal:  Normal muscle strength 5/5 to all lower extremity muscle groups bilaterally. No pain crepitus or joint limitation noted with ROM b/l. No gross bony deformities bilaterally.  Neurological:  Protective sensation intact 5/5 intact bilaterally with 10g monofilament b/l. Vibratory sensation intact b/l.  Assessment and Plan:  1. Pain due to onychomycosis of toenails of both feet   2. Controlled type 2 diabetes mellitus without complication, without long-term current use of insulin ([place])      -Examined patient. -Continue diabetic foot care principles. -Patient to continue soft, supportive shoe gear daily. -Toenails 1-5 b/l were debrided in  length and girth with sterile nail nippers and dremel without iatrogenic bleeding.  -Patient to report any pedal injuries to medical professional immediately. -Patient/POA to call should there be question/concern in the interim.  Return in about 3 months (around 12/18/2020).  Marzetta Board, DPM

## 2020-10-07 ENCOUNTER — Other Ambulatory Visit: Payer: Self-pay | Admitting: Physician Assistant

## 2020-10-07 DIAGNOSIS — K8681 Exocrine pancreatic insufficiency: Secondary | ICD-10-CM

## 2020-10-07 DIAGNOSIS — K529 Noninfective gastroenteritis and colitis, unspecified: Secondary | ICD-10-CM

## 2020-10-14 ENCOUNTER — Ambulatory Visit
Admission: RE | Admit: 2020-10-14 | Discharge: 2020-10-14 | Disposition: A | Discharge status: Home / Self Care | Payer: Medicare Other | Source: Ambulatory Visit | Attending: Physician Assistant | Admitting: Physician Assistant

## 2020-10-14 DIAGNOSIS — K529 Noninfective gastroenteritis and colitis, unspecified: Secondary | ICD-10-CM

## 2020-10-14 DIAGNOSIS — K8681 Exocrine pancreatic insufficiency: Secondary | ICD-10-CM

## 2020-11-04 ENCOUNTER — Other Ambulatory Visit: Payer: Self-pay

## 2020-12-13 ENCOUNTER — Telehealth: Payer: Self-pay

## 2020-12-13 ENCOUNTER — Other Ambulatory Visit: Payer: Self-pay | Admitting: General Surgery

## 2020-12-13 DIAGNOSIS — R928 Other abnormal and inconclusive findings on diagnostic imaging of breast: Secondary | ICD-10-CM | POA: Insufficient documentation

## 2020-12-13 NOTE — Telephone Encounter (Signed)
   [facility] Pre-operative Risk Assessment    Patient Name: [name]  DOB: 08/13/1950 MRN: [id number]  HEARTCARE STAFF:  - IMPORTANT!!!!!! Under Visit Info/Reason for Call, type in Other and utilize the format Clearance MM/DD/YY or Clearance TBD. Do not use dashes or single digits. - Please review there is not already an duplicate clearance open for this procedure. - If request is for dental extraction, please clarify the # of teeth to be extracted. - If the patient is currently at the dentist's office, call Pre-Op Callback Staff (MA/nurse) to input urgent request.  - If the patient is not currently in the dentist office, please route to the Pre-Op pool.  Request for surgical clearance:  What type of surgery is being performed? LEFT BREAST SEED LOCALIZED EXCISIONAL BIOPSY SURGERY  When is this surgery scheduled? TBD  What type of clearance is required (medical clearance vs. Pharmacy clearance to hold med vs. Both)? MEDICAL  Are there any medications that need to be held prior to surgery and how long? NONE LISTED  Practice name and name of physician performing surgery? [facility]; Stark Klein, MD  What is the office phone number? [phone]   7.   What is the office fax number? [phone]  8.   Anesthesia type (None, local, MAC, general) ? GENERAL   Jacinta Shoe 12/13/2020, 5:28 PM  _________________________________________________________________   (provider comments below)

## 2020-12-14 NOTE — Telephone Encounter (Signed)
   Name: [name]  DOB: 1950/08/11  MRN: [id number]   Primary Cardiologist: Candee Furbish, MD  Chart reviewed as part of pre-operative protocol coverage. Patient was contacted 12/14/2020 in reference to pre-operative risk assessment for pending surgery as outlined below.  [name] was last seen on 08/05/20 by Dr. Marlou Porch.  Since that day, [name] has done well from a cardiac standpoint. She can easily complete 4 METs without anginal complaints.   Therefore, based on ACC/AHA guidelines, the patient would be at acceptable risk for the planned procedure without further cardiovascular testing.   The patient was advised that if she develops new symptoms prior to surgery to contact our office to arrange for a follow-up visit, and she verbalized understanding.  I will route this recommendation to the requesting party via Epic fax function and remove from pre-op pool. Please call with questions.  Abigail Butts, PA-C 12/14/2020, 11:22 AM

## 2020-12-14 NOTE — Telephone Encounter (Signed)
   Name: [name]  DOB: 05-09-1950  MRN: [id number]   Primary Cardiologist: Candee Furbish, MD  Chart reviewed as part of pre-operative protocol coverage.   Left a voicemail for patient to call back for ongoing preop assessment.   Abigail Butts, PA-C 12/14/2020, 9:30 AM

## 2020-12-17 ENCOUNTER — Other Ambulatory Visit: Payer: Self-pay | Admitting: Physician Assistant

## 2020-12-17 ENCOUNTER — Ambulatory Visit
Admission: RE | Admit: 2020-12-17 | Discharge: 2020-12-17 | Disposition: A | Discharge status: Home / Self Care | Payer: Medicare Other | Source: Ambulatory Visit | Attending: Physician Assistant | Admitting: Physician Assistant

## 2020-12-17 DIAGNOSIS — K5900 Constipation, unspecified: Secondary | ICD-10-CM

## 2020-12-20 ENCOUNTER — Ambulatory Visit (INDEPENDENT_AMBULATORY_CARE_PROVIDER_SITE_OTHER): Payer: Medicare Other | Admitting: Podiatry

## 2020-12-20 ENCOUNTER — Other Ambulatory Visit: Payer: Self-pay

## 2020-12-20 DIAGNOSIS — E119 Type 2 diabetes mellitus without complications: Secondary | ICD-10-CM

## 2020-12-20 DIAGNOSIS — M79674 Pain in right toe(s): Secondary | ICD-10-CM

## 2020-12-20 DIAGNOSIS — B351 Tinea unguium: Secondary | ICD-10-CM | POA: Diagnosis not present

## 2020-12-20 DIAGNOSIS — M79675 Pain in left toe(s): Secondary | ICD-10-CM

## 2020-12-25 ENCOUNTER — Encounter: Payer: Self-pay | Admitting: Podiatry

## 2020-12-25 NOTE — Progress Notes (Signed)
  Subjective:  Patient ID: [name], female    DOB: [birth year],  MRN: [id number]  [name] presents to clinic today for painful thick toenails that are difficult to trim. Pain interferes with ambulation. Aggravating factors include wearing enclosed shoe gear. Pain is relieved with periodic professional debridement.  Patient did not check blood glucose today. Last A1c was 6.3%.  PCP is [name], MD , and last visit was April, 2022.  Allergies  Allergen Reactions   Tramadol Other (See Comments)    Seizure d/o    Review of Systems: Negative except as noted in the HPI. Objective:   Constitutional [name] is a pleasant 70 y.o. African American female, WD, WN in NAD. AAO x 3.   Vascular Capillary refill time to digits immediate b/l. Palpable pedal pulses b/l LE. Pedal hair present. Lower extremity skin temperature gradient within normal limits. No pain with calf compression b/l. No edema noted b/l lower extremities. No cyanosis or clubbing noted.  Neurologic Normal speech. Oriented to person, place, and time. Protective sensation intact 5/5 intact bilaterally with 10g monofilament b/l.  Dermatologic Skin warm and supple b/l lower extremities. No open wounds b/l lower extremities. No interdigital macerations b/l lower extremities. Toenails 1-5 b/l elongated, discolored, dystrophic, thickened, crumbly with subungual debris and tenderness to dorsal palpation.  Orthopedic: Normal muscle strength 5/5 to all lower extremity muscle groups bilaterally. No pain crepitus or joint limitation noted with ROM b/l lower extremities. No gross bony deformities b/l lower extremities. Patient ambulates independent of any assistive aids.   Radiographs: None Assessment:   1. Pain due to onychomycosis of toenails of both feet   2. Controlled type 2 diabetes mellitus without complication, without long-term current use of insulin ([place])    Plan:  -Examined patient. -Continue diabetic foot  care principles: inspect feet daily, monitor glucose as recommended by PCP and/or Endocrinologist, and follow prescribed diet per PCP, Endocrinologist and/or dietician. -Patient to continue soft, supportive shoe gear daily. -Toenails 1-5 b/l were debrided in length and girth with sterile nail nippers and dremel without iatrogenic bleeding.  -Patient to report any pedal injuries to medical professional immediately. -Patient/POA to call should there be question/concern in the interim.  Return in about 3 months (around 03/22/2021).  Marzetta Board, DPM

## 2021-01-11 ENCOUNTER — Telehealth: Payer: Self-pay | Admitting: Internal Medicine

## 2021-01-11 DIAGNOSIS — J4550 Severe persistent asthma, uncomplicated: Secondary | ICD-10-CM

## 2021-01-11 DIAGNOSIS — J301 Allergic rhinitis due to pollen: Secondary | ICD-10-CM

## 2021-01-11 MED ORDER — MONTELUKAST SODIUM 10 MG PO TABS: 10.0000 mg | ORAL_TABLET | Freq: Every day | ORAL | 5 refills | Status: DC

## 2021-01-11 NOTE — Telephone Encounter (Signed)
Refill sent into pharmacy.  Nothing further needed at this time.

## 2021-01-13 NOTE — Pre-Procedure Instructions (Signed)
Surgical Instructions    Your procedure is scheduled on Thursday 01/20/2021.   Report to [facility] Main Entrance "A" at 10:00 A.M., then check in with the Admitting office.  Call this number if you have problems the morning of surgery:  [phone]   If you have any questions prior to your surgery date call [phone]: Open Monday-Friday 8am-4pm    Remember:  Do not eat after midnight the night before your surgery  You may drink clear liquids until 09:00 A.M. the morning of your surgery.   Clear liquids allowed are: Water, Non-Citrus Juices (without pulp), Carbonated Beverages, Clear Tea, Black Coffee ONLY (NO MILK, CREAM OR POWDERED CREAMER of any kind), and Gatorade    Take these medicines the morning of surgery with A SIP OF WATER   diltiazem (CARTIA XT)  esomeprazole (NEXIUM)   formoterol (PERFOROMIST)  hydrALAZINE (APRESOLINE)  memantine (NAMENDA)  nebivolol (BYSTOLIC)     Take these medicines if needed:   albuterol (PROAIR HFA) 108 (90 Base)  albuterol (PROVENTIL) (2.5 MG/3ML)  ALPRAZolam (XANAX)  budesonide (PULMICORT)   nitroGLYCERIN (NITROSTAT)    As of today, STOP taking any Aspirin (unless otherwise instructed by your surgeon) Aleve, Naproxen, Ibuprofen, Motrin, Advil, Goody's, BC's, all herbal medications, fish oil, and all vitamins.  WHAT DO I DO ABOUT MY DIABETES MEDICATION?   Do not take oral diabetes medicines (pills) the morning of surgery. Do not take metFORMIN (GLUCOPHAGE) the morning of surgery.   The day of surgery, do not take other diabetes injectables, including Byetta (exenatide), Bydureon (exenatide ER), Victoza (liraglutide), or Trulicity (dulaglutide).    HOW TO MANAGE YOUR DIABETES BEFORE AND AFTER SURGERY  Why is it important to control my blood sugar before and after surgery? Improving blood sugar levels before and after surgery helps healing and can limit problems. A way of improving blood sugar control is eating a healthy diet  by:  Eating less sugar and carbohydrates  Increasing activity/exercise  Talking with your doctor about reaching your blood sugar goals High blood sugars (greater than 180 mg/dL) can raise your risk of infections and slow your recovery, so you will need to focus on controlling your diabetes during the weeks before surgery. Make sure that the doctor who takes care of your diabetes knows about your planned surgery including the date and location.  How do I manage my blood sugar before surgery? Check your blood sugar at least 4 times a day, starting 2 days before surgery, to make sure that the level is not too high or low.  Check your blood sugar the morning of your surgery when you wake up and every 2 hours until you get to the Short Stay unit.  If your blood sugar is less than 70 mg/dL, you will need to treat for low blood sugar: Do not take insulin. Treat a low blood sugar (less than 70 mg/dL) with  cup of clear juice (cranberry or apple), 4 glucose tablets, OR glucose gel. Recheck blood sugar in 15 minutes after treatment (to make sure it is greater than 70 mg/dL). If your blood sugar is not greater than 70 mg/dL on recheck, call [phone] for further instructions. Report your blood sugar to the short stay nurse when you get to Short Stay.  If you are admitted to the hospital after surgery: Your blood sugar will be checked by the staff and you will probably be given insulin after surgery (instead of oral diabetes medicines) to make sure you have good blood sugar  levels. The goal for blood sugar control after surgery is 80-180 mg/dL.           Do not wear jewelry or makeup Do not wear lotions, powders, perfumes/colognes, or deodorant. Do not shave 48 hours prior to surgery.  Men may shave face and neck. Do not bring valuables to the hospital. DO Not wear nail polish, gel polish, artificial nails, or any other type of covering on natural nails including finger and toenails. If patients  have artificial nails, gel coating, etc. that need to be removed by a nail salon please have this removed prior to surgery or surgery may need to be canceled/delayed if the surgeon/ anesthesia feels like the patient is unable to be adequately monitored.             [facility] is not responsible for any belongings or valuables.  Do NOT Smoke (Tobacco/Vaping)  24 hours prior to your procedure If you use a CPAP at night, you may bring your mask for your overnight stay.   Contacts, glasses, dentures or bridgework may not be worn into surgery, please bring cases for these belongings   For patients admitted to the hospital, discharge time will be determined by your treatment team.   Patients discharged the day of surgery will not be allowed to drive home, and someone needs to stay with them for 24 hours.  NO VISITORS WILL BE ALLOWED IN PRE-OP WHERE PATIENTS GET READY FOR SURGERY.  ONLY 1 SUPPORT PERSON MAY BE PRESENT WHILE YOU ARE IN SURGERY.  IF YOU ARE TO BE ADMITTED, ONCE YOU ARE IN YOUR ROOM YOU WILL BE ALLOWED TWO (2) VISITORS.  Minor children may have two parents present. Special consideration for safety and communication needs will be reviewed on a case by case basis.  Special instructions:    Oral Hygiene is also important to reduce your risk of infection.  Remember - BRUSH YOUR TEETH THE MORNING OF SURGERY WITH YOUR REGULAR TOOTHPASTE   [facility]- Preparing For Surgery  Before surgery, you can play an important role. Because skin is not sterile, your skin needs to be as free of germs as possible. You can reduce the number of germs on your skin by washing with CHG (chlorahexidine gluconate) Soap before surgery.  CHG is an antiseptic cleaner which kills germs and bonds with the skin to continue killing germs even after washing.     Please do not use if you have an allergy to CHG or antibacterial soaps. If your skin becomes reddened/irritated stop using the CHG.  Do not shave (including  legs and underarms) for at least 48 hours prior to first CHG shower. It is OK to shave your face.  Please follow these instructions carefully.     Shower the NIGHT BEFORE SURGERY and the MORNING OF SURGERY with CHG Soap.   If you chose to wash your hair, wash your hair first as usual with your normal shampoo. After you shampoo, rinse your hair and body thoroughly to remove the shampoo.  Then [employer] and genitals (private parts) with your normal soap and rinse thoroughly to remove soap.  After that Use CHG Soap as you would any other liquid soap. You can apply CHG directly to the skin and wash gently with a scrungie or a clean washcloth.   Apply the CHG Soap to your body ONLY FROM THE NECK DOWN.  Do not use on open wounds or open sores. Avoid contact with your eyes, ears, mouth and  genitals (private parts). Wash Face and genitals (private parts)  with your normal soap.   Wash thoroughly, paying special attention to the area where your surgery will be performed.  Thoroughly rinse your body with warm water from the neck down.  DO NOT shower/wash with your normal soap after using and rinsing off the CHG Soap.  Pat yourself dry with a CLEAN TOWEL.  Wear CLEAN PAJAMAS to bed the night before surgery  Place CLEAN SHEETS on your bed the night before your surgery  DO NOT SLEEP WITH PETS.   Day of Surgery:  Take a shower with CHG soap. Wear Clean/Comfortable clothing the morning of surgery Do not apply any deodorants/lotions.   Remember to brush your teeth WITH YOUR REGULAR TOOTHPASTE.   Please read over the following fact sheets that you were given.

## 2021-01-14 ENCOUNTER — Other Ambulatory Visit: Payer: Self-pay

## 2021-01-14 ENCOUNTER — Encounter (HOSPITAL_COMMUNITY): Payer: Self-pay

## 2021-01-14 ENCOUNTER — Encounter (HOSPITAL_COMMUNITY)
Admission: RE | Admit: 2021-01-14 | Discharge: 2021-01-14 | Disposition: A | Discharge status: Home / Self Care | Payer: Medicare Other | Source: Ambulatory Visit | Attending: General Surgery | Admitting: General Surgery

## 2021-01-14 DIAGNOSIS — E785 Hyperlipidemia, unspecified: Secondary | ICD-10-CM | POA: Insufficient documentation

## 2021-01-14 DIAGNOSIS — R928 Other abnormal and inconclusive findings on diagnostic imaging of breast: Secondary | ICD-10-CM | POA: Insufficient documentation

## 2021-01-14 DIAGNOSIS — E1122 Type 2 diabetes mellitus with diabetic chronic kidney disease: Secondary | ICD-10-CM | POA: Diagnosis not present

## 2021-01-14 DIAGNOSIS — Z79899 Other long term (current) drug therapy: Secondary | ICD-10-CM | POA: Diagnosis not present

## 2021-01-14 DIAGNOSIS — I130 Hypertensive heart and chronic kidney disease with heart failure and stage 1 through stage 4 chronic kidney disease, or unspecified chronic kidney disease: Secondary | ICD-10-CM | POA: Diagnosis not present

## 2021-01-14 DIAGNOSIS — Z7984 Long term (current) use of oral hypoglycemic drugs: Secondary | ICD-10-CM | POA: Insufficient documentation

## 2021-01-14 DIAGNOSIS — K219 Gastro-esophageal reflux disease without esophagitis: Secondary | ICD-10-CM | POA: Diagnosis not present

## 2021-01-14 DIAGNOSIS — Z01812 Encounter for preprocedural laboratory examination: Secondary | ICD-10-CM | POA: Diagnosis present

## 2021-01-14 DIAGNOSIS — R569 Unspecified convulsions: Secondary | ICD-10-CM | POA: Diagnosis not present

## 2021-01-14 DIAGNOSIS — Z8616 Personal history of COVID-19: Secondary | ICD-10-CM | POA: Diagnosis not present

## 2021-01-14 DIAGNOSIS — N183 Chronic kidney disease, stage 3 unspecified: Secondary | ICD-10-CM | POA: Insufficient documentation

## 2021-01-14 DIAGNOSIS — Z87891 Personal history of nicotine dependence: Secondary | ICD-10-CM | POA: Diagnosis not present

## 2021-01-14 DIAGNOSIS — I2510 Atherosclerotic heart disease of native coronary artery without angina pectoris: Secondary | ICD-10-CM | POA: Diagnosis not present

## 2021-01-14 DIAGNOSIS — I252 Old myocardial infarction: Secondary | ICD-10-CM | POA: Insufficient documentation

## 2021-01-14 DIAGNOSIS — J45909 Unspecified asthma, uncomplicated: Secondary | ICD-10-CM | POA: Diagnosis not present

## 2021-01-14 DIAGNOSIS — I5032 Chronic diastolic (congestive) heart failure: Secondary | ICD-10-CM | POA: Insufficient documentation

## 2021-01-14 HISTORY — DX: Personal history of other specified conditions: Z87.898

## 2021-01-14 HISTORY — DX: Chronic kidney disease, stage 3 unspecified: N18.30

## 2021-01-14 HISTORY — DX: Unspecified dementia, unspecified severity, without behavioral disturbance, psychotic disturbance, mood disturbance, and anxiety: F03.90

## 2021-01-14 HISTORY — DX: Chronic kidney disease, unspecified: N18.9

## 2021-01-14 HISTORY — DX: Unspecified osteoarthritis, unspecified site: M19.90

## 2021-01-14 LAB — CBC
HCT: 36.3 % (ref 36.0–46.0)
Hemoglobin: 11.5 g/dL — ABNORMAL LOW (ref 12.0–15.0)
MCH: 30.5 pg (ref 26.0–34.0)
MCHC: 31.7 g/dL (ref 30.0–36.0)
MCV: 96.3 fL (ref 80.0–100.0)
Platelets: 285 10*3/uL (ref 150–400)
RBC: 3.77 MIL/uL — ABNORMAL LOW (ref 3.87–5.11)
RDW: 13.2 % (ref 11.5–15.5)
WBC: 7.4 10*3/uL (ref 4.0–10.5)
nRBC: 0 % (ref 0.0–0.2)

## 2021-01-14 LAB — BASIC METABOLIC PANEL
Anion gap: 10 (ref 5–15)
BUN: 24 mg/dL — ABNORMAL HIGH (ref 8–23)
CO2: 27 mmol/L (ref 22–32)
Calcium: 9.2 mg/dL (ref 8.9–10.3)
Chloride: 101 mmol/L (ref 98–111)
Creatinine, Ser: 1.23 mg/dL — ABNORMAL HIGH (ref 0.44–1.00)
GFR, Estimated: 47 mL/min — ABNORMAL LOW (ref >60)
Glucose, Bld: 115 mg/dL — ABNORMAL HIGH (ref 70–99)
Potassium: 3.5 mmol/L (ref 3.5–5.1)
Sodium: 138 mmol/L (ref 135–145)

## 2021-01-14 LAB — HEMOGLOBIN A1C
Hgb A1c MFr Bld: 6 % — ABNORMAL HIGH (ref 4.8–5.6)
Mean Plasma Glucose: 125.5 mg/dL

## 2021-01-14 LAB — GLUCOSE, CAPILLARY: Glucose-Capillary: 124 mg/dL — ABNORMAL HIGH (ref 70–99)

## 2021-01-14 NOTE — Pre-Procedure Instructions (Signed)
Surgical Instructions    Your procedure is scheduled on Thursday, January 20, 2021.   Report to [facility] Main Entrance "A" at 10:00 A.M., then check in with the Admitting office.  Call this number if you have problems the morning of surgery:  [phone]   If you have any questions prior to your surgery date call [phone]: Open Monday-Friday 8am-4pm   Remember:  Do not eat after midnight the night before your surgery  You may drink clear liquids until 09:00 A.M. the morning of your surgery.   Clear liquids allowed are: Water, Non-Citrus Juices (without pulp), Carbonated Beverages, Clear Tea, Black Coffee ONLY (NO MILK, CREAM OR POWDERED CREAMER of any kind), and Gatorade    Take these medicines the morning of surgery with A SIP OF WATER:   Diltiazem (CARTIA XT)  Esomeprazole (NEXIUM)   Formoterol (PERFOROMIST) Nebulizer  Budesonide (PULMICORT) Nebulizer  HydrALAZINE (APRESOLINE)  Memantine (NAMENDA)  Nebivolol (BYSTOLIC)     Take these medicines if needed:   albuterol (PROAIR HFA) Inhaler  -- Please bring all inhalers with you the day of surgery.   albuterol (PROVENTIL) Nebulizer  ALPRAZolam (XANAX)  Fluticasone (Flonase) nasal spray  nitroGLYCERIN (NITROSTAT)   As of today, STOP taking any Aspirin (unless otherwise instructed by your surgeon) Aleve, Naproxen, Ibuprofen, Motrin, Advil, Goody's, BC's, all herbal medications, fish oil, and all vitamins.  WHAT DO I DO ABOUT MY DIABETES MEDICATION?   Do not take oral diabetes medicines (pills) the morning of surgery. Do not take METFORMIN (GLUCOPHAGE) the morning of surgery.    HOW TO MANAGE YOUR DIABETES BEFORE AND AFTER SURGERY  Why is it important to control my blood sugar before and after surgery? Improving blood sugar levels before and after surgery helps healing and can limit problems. A way of improving blood sugar control is eating a healthy diet by:  Eating less sugar and carbohydrates  Increasing  activity/exercise  Talking with your doctor about reaching your blood sugar goals High blood sugars (greater than 180 mg/dL) can raise your risk of infections and slow your recovery, so you will need to focus on controlling your diabetes during the weeks before surgery. Make sure that the doctor who takes care of your diabetes knows about your planned surgery including the date and location.  How do I manage my blood sugar before surgery? Check your blood sugar at least 4 times a day, starting 2 days before surgery, to make sure that the level is not too high or low.  Check your blood sugar the morning of your surgery when you wake up and every 2 hours until you get to the Short Stay unit.  If your blood sugar is less than 70 mg/dL, you will need to treat for low blood sugar: Do not take insulin. Treat a low blood sugar (less than 70 mg/dL) with  cup of clear juice (cranberry or apple), 4 glucose tablets, OR glucose gel. Recheck blood sugar in 15 minutes after treatment (to make sure it is greater than 70 mg/dL). If your blood sugar is not greater than 70 mg/dL on recheck, call [phone] for further instructions. Report your blood sugar to the short stay nurse when you get to Short Stay.  If you are admitted to the hospital after surgery: Your blood sugar will be checked by the staff and you will probably be given insulin after surgery (instead of oral diabetes medicines) to make sure you have good blood sugar levels. The goal for blood sugar control  after surgery is 80-180 mg/dL.           Do not wear jewelry or makeup Do not wear lotions, powders, perfumes/colognes, or deodorant. Do not shave 48 hours prior to surgery.  Men may shave face and neck. Do not bring valuables to the hospital. DO Not wear nail polish, gel polish, artificial nails, or any other type of covering on natural nails including finger and toenails. If patients have artificial nails, gel coating, etc. that need to be  removed by a nail salon please have this removed prior to surgery. Surgery may need to be canceled/delayed if the surgeon/ anesthesia feels like the patient is unable to be adequately monitored.             [facility] is not responsible for any belongings or valuables.  Do NOT Smoke (Tobacco/Vaping)  24 hours prior to your procedure If you use a CPAP at night, you may bring your mask for your overnight stay.   Contacts, glasses, dentures or bridgework may not be worn into surgery, please bring cases for these belongings   For patients admitted to the hospital, discharge time will be determined by your treatment team.   Patients discharged the day of surgery will not be allowed to drive home, and someone needs to stay with them for 24 hours.  NO VISITORS WILL BE ALLOWED IN PRE-OP WHERE PATIENTS GET READY FOR SURGERY.  ONLY 1 SUPPORT PERSON MAY BE PRESENT WHILE YOU ARE IN SURGERY.  IF YOU ARE TO BE ADMITTED, ONCE YOU ARE IN YOUR ROOM YOU WILL BE ALLOWED TWO (2) VISITORS.  Minor children may have two parents present. Special consideration for safety and communication needs will be reviewed on a case by case basis.  Special instructions:    Oral Hygiene is also important to reduce your risk of infection.  Remember - BRUSH YOUR TEETH THE MORNING OF SURGERY WITH YOUR REGULAR TOOTHPASTE   [facility]- Preparing For Surgery  Before surgery, you can play an important role. Because skin is not sterile, your skin needs to be as free of germs as possible. You can reduce the number of germs on your skin by washing with CHG (chlorahexidine gluconate) Soap before surgery.  CHG is an antiseptic cleaner which kills germs and bonds with the skin to continue killing germs even after washing.     Please do not use if you have an allergy to CHG or antibacterial soaps. If your skin becomes reddened/irritated stop using the CHG.  Do not shave (including legs and underarms) for at least 48 hours prior to first  CHG shower. It is OK to shave your face.  Please follow these instructions carefully.     Shower the NIGHT BEFORE SURGERY and the MORNING OF SURGERY with CHG Soap.   If you chose to wash your hair, wash your hair first as usual with your normal shampoo. After you shampoo, rinse your hair and body thoroughly to remove the shampoo.  Then [employer] and genitals (private parts) with your normal soap and rinse thoroughly to remove soap.  After that Use CHG Soap as you would any other liquid soap. You can apply CHG directly to the skin and wash gently with a scrungie or a clean washcloth.   Apply the CHG Soap to your body ONLY FROM THE NECK DOWN.  Do not use on open wounds or open sores. Avoid contact with your eyes, ears, mouth and genitals (private parts). Wash Face and genitals (private  parts)  with your normal soap.   Wash thoroughly, paying special attention to the area where your surgery will be performed.  Thoroughly rinse your body with warm water from the neck down.  DO NOT shower/wash with your normal soap after using and rinsing off the CHG Soap.  Pat yourself dry with a CLEAN TOWEL.  Wear CLEAN PAJAMAS to bed the night before surgery  Place CLEAN SHEETS on your bed the night before your surgery  DO NOT SLEEP WITH PETS.   Day of Surgery:  Take a shower with CHG soap. Wear Clean/Comfortable clothing the morning of surgery Do not apply any deodorants/lotions.   Remember to brush your teeth WITH YOUR REGULAR TOOTHPASTE.   Please read over the following fact sheets that you were given.

## 2021-01-14 NOTE — Progress Notes (Signed)
PCP: Dr. [name] Cardiologist: Dr. Candee Furbish Pulmonary: Dr. Melvyn Novas  EKG:  06/18/20 CXR: 06/16/20 1 view ECHO: 08/13/2017 Stress Test: 05/31/2020 Cardiac Cath: 01/18/2016  Fasting Blood Sugar- 110-140 Checks Blood Sugar 3 times a week.  Patient denies shortness of breath, fever, cough, and chest pain at PAT appointment.  Patient verbalized understanding of instructions provided today at the PAT appointment.  Patient asked to review instructions at home and day of surgery.

## 2021-01-17 NOTE — Progress Notes (Signed)
Anesthesia Chart Review:  Case: [id number] Date/Time: 01/20/21 1145   Procedure: RADIOACTIVE SEED GUIDED EXCISIONAL LEFT BREAST BIOPSY (Left: Breast)   Anesthesia type: General   Pre-op diagnosis: ABNORMAL LEFT MAMMOGRAM   Location: [facility] OR ROOM 02 / [place] [facility]   Surgeons: Stark Klein, MD       DISCUSSION: Patient is a 70 year old female scheduled for the above procedure. She was found to have a possible left breast mass on CT abd/pelvis 10/14/20. 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   History includes former smoker (quit 01/09/04), CAD (NSTEMI, s/p DES x 2 mid & distal RCA 01/18/16), chronic diastolic CHF, asthma (mild, persistent; admission for asthma exacerbation in setting of + COVID-19 06/2020, s/p steroid, remdesivir, Zithromax), CKD (stage 3), HTN, dyslipidemia, seizure, dementia, DM2, GERD.  Preoperative cardiology input outlined by Roby Lofts, PA-C on 12/14/20, "Patient was contacted 12/14/2020 in reference to pre-operative risk assessment for pending surgery as outlined below.  [name] was last seen on 08/05/20 by Dr. Marlou Porch.  Since that day, [name] has done well from a cardiac standpoint. She can easily complete 4 METs without anginal complaints.    Therefore, based on ACC/AHA guidelines, the patient would be at acceptable risk for the planned procedure without further cardiovascular testing..." She had a normal stress test 05/31/2020.  [facility] 01/19/21 at 1:00 PM. Anesthesia team to evaluate on the day of surgery.     VS: BP (!) 144/63   Pulse 64   Temp 37.1 C   Resp 17   Ht '5\' 1"'$  (1.549 m)   Wt 71.1 kg   LMP  (LMP Unknown)   SpO2 100%   BMI 29.61 kg/m    PROVIDERS: [name], MD is PCP  Candee Furbish, MD is cardiologist. Last visit 08/05/20. 6 month follow-up planned. Christinia Gully, MD is pulmonologist. Last visit 09/08/20. Six month follow-up planned.  Last neurology visit  4/128/11 with Butler Denmark, NP for seizure and memory loss follow-up. MMSE improved to 26/30 from 22/30. Continue Namenda. One year follow-up planned.     LABS: Labs reviewed: Acceptable for surgery. (all labs ordered are listed, but only abnormal results are displayed)  Labs Reviewed  GLUCOSE, CAPILLARY - Abnormal; Notable for the following components:      Result Value   Glucose-Capillary 124 (*)    All other components within normal limits  HEMOGLOBIN A1C - Abnormal; Notable for the following components:   Hgb A1c MFr Bld 6.0 (*)    All other components within normal limits  BASIC METABOLIC PANEL - Abnormal; Notable for the following components:   Glucose, Bld 115 (*)    BUN 24 (*)    Creatinine, Ser 1.23 (*)    GFR, Estimated 47 (*)    All other components within normal limits  CBC - Abnormal; Notable for the following components:   RBC 3.77 (*)    Hemoglobin 11.5 (*)    All other components within normal limits   PFTs 04/18/18: FVC 1.19 (55%), post 1.25 (58%). FEV1 1.05 (64%), post 1.13 (68%). DLCO unc 11.21 (,53%) cor 12.60 (60%).    EKG: 06/18/20:  Normal sinus rhythm Moderate voltage criteria for LVH, may be normal variant ( R in aVL , Cornell product ) T wave abnormality, consider anterior ischemia Abnormal ECG Confirmed by Charolette Forward [phone]) on 06/18/2020 9:39:55 PM   CV: [place] RLE Venous 09/14/20: IMPRESSION: Negative.   Nuclear stress test 05/31/20: Nuclear  stress EF: 75%. There was no ST segment deviation noted during stress. The study is normal. This is a low risk study. The left ventricular ejection fraction is hyperdynamic (>65%).   - There is significant bowel radioisotope uptake in the vicinity of the inferior LV wall, without evidence of perfusion defect.  - No ischemia or infarction on perfusion imaging.    Echo 08/13/17: Study Conclusions  - Left ventricle: The cavity size was normal. There was moderate    concentric hypertrophy. Systolic  function was vigorous. The    estimated ejection fraction was in the range of 65% to 70%. Wall    motion was normal; there were no regional wall motion    abnormalities. Features are consistent with a pseudonormal left    ventricular filling pattern, with concomitant abnormal relaxation    and increased filling pressure (grade 2 diastolic dysfunction).    Doppler parameters are consistent with high ventricular filling    pressure.  - Mitral valve: There was trivial regurgitation.  - Pulmonary arteries: PA peak pressure: 34 mm Hg (S).    Cardiac cath 01/18/16: Mid RCA lesion, 70 %stenosed. Dist RCA lesion, 90 %stenosed. A STENT SYNERGY DES [id number] drug eluting stent was successfully placed, and overlaps the 2.5 x 38 mm Synergy stent placed distally. Prox RCA lesion, 80 %stenosed. Post intervention, there is a 0% residual stenosis. Ost Cx to Prox Cx lesion, 10 %stenosed. Mid LAD lesion, 10 %stenosed.   1. NSTEMI 2. Severe single vessel CAD with severe stenosis in the mid and distal RCA 3. Successful PTCA/DES x 2 mid and distal RCA    US Carotid 06/02/09: IMPRESSION:   Evidence of atherosclerotic disease without evidence of hemodynamically significant stenosis.    Past Medical History:  Diagnosis Date   Anxiety    Arthritis    Asthma    Bronchitis    Chronic diastolic CHF (congestive heart failure) ([facility]) 07/17/2016   Chronic kidney disease    Coronary artery disease    a. NSTEMI 12/2015 - LHC 01/18/16: s/p overlapping DESx2 to RCA, 10% ost-prox Cx,10% mLAD.   Dementia ([place])    Depression    Diabetes mellitus    Dyslipidemia    GERD (gastroesophageal reflux disease)    Heart attack ([place])    History of seizure    Hypertension    Hypertensive heart disease    NSTEMI (non-ST elevated myocardial infarction) ([place]) 12/2015   Pneumonia    Stage 3 chronic kidney disease ([place])     Past Surgical History:  Procedure Laterality Date   ABDOMINAL HYSTERECTOMY     CARDIAC  CATHETERIZATION  1994   minimal LAD dz, no other dz, EF normal   CARDIAC CATHETERIZATION N/A 01/18/2016   Procedure: Left Heart Cath and Coronary Angiography;  Surgeon: Burnell Blanks, MD;  Location: [facility];  Service: Cardiovascular;  Laterality: N/A;   CARDIAC CATHETERIZATION N/A 01/18/2016   Procedure: Coronary Stent Intervention;  Surgeon: Burnell Blanks, MD;  Location: [facility];  Service: Cardiovascular;  Laterality: N/A;   COLONOSCOPY WITH PROPOFOL N/A 04/09/2014   Procedure: COLONOSCOPY WITH PROPOFOL;  Surgeon: [place] Nipper, MD;  Location: [facility];  Service: Endoscopy;  Laterality: N/A;   CORONARY STENT PLACEMENT  01/19/2016   STENT SYNERGY DES [id number] drug eluting stent was successfully placed, and overlaps the 2.5 x 38 mm Synergy stent placed distally.   ESOPHAGOGASTRODUODENOSCOPY (EGD) WITH PROPOFOL N/A 04/09/2014   Procedure: ESOPHAGOGASTRODUODENOSCOPY (EGD) WITH PROPOFOL;  Surgeon: Herbie Baltimore  Buccini V, MD;  Location: [facility];  Service: Endoscopy;  Laterality: N/A;   FOOT SURGERY Bilateral    approx ~2000   HEMORRHOID SURGERY     HERNIA REPAIR     KNEE ARTHROSCOPY      MEDICATIONS:  albuterol (PROAIR HFA) 108 (90 Base) MCG/ACT inhaler   albuterol (PROVENTIL) (2.5 MG/3ML) 0.083% nebulizer solution   ALPRAZolam (XANAX) 0.5 MG tablet   atorvastatin (LIPITOR) 40 MG tablet   Biotin 5000 MCG CAPS   budesonide (PULMICORT) 0.5 MG/2ML nebulizer solution   chlorthalidone (HYGROTON) 25 MG tablet   diltiazem (CARTIA XT) 240 MG 24 hr capsule   divalproex (DEPAKOTE ER) 500 MG 24 hr tablet   esomeprazole (NEXIUM) 40 MG capsule   fluticasone (FLONASE) 50 MCG/ACT nasal spray   formoterol (PERFOROMIST) 20 MCG/2ML nebulizer solution   hydrALAZINE (APRESOLINE) 50 MG tablet   latanoprost (XALATAN) 0.005 % ophthalmic solution   memantine (NAMENDA) 10 MG tablet   metFORMIN (GLUCOPHAGE) 500 MG tablet   montelukast (SINGULAIR) 10 MG tablet    Multiple Vitamin (MULTIVITAMIN WITH MINERALS) TABS tablet   nebivolol (BYSTOLIC) 10 MG tablet   nitroGLYCERIN (NITROSTAT) 0.4 MG SL tablet   potassium chloride SA (K-DUR,KLOR-CON) 20 MEQ tablet   Probiotic Product (EQ PROBIOTIC) CAPS   No current facility-administered medications for this encounter.    Myra Gianotti, PA-C Surgical Short Stay/Anesthesiology [facility] Phone [phone] [facility] Phone [phone] 01/17/2021 2:55 PM

## 2021-01-17 NOTE — Anesthesia Preprocedure Evaluation (Addendum)
Anesthesia Evaluation  Patient identified by MRN, date of birth, ID band Patient awake    Reviewed: Allergy & Precautions, NPO status , Patient's Chart, lab work & pertinent test results, reviewed documented beta blocker date and time   Airway Mallampati: II  TM Distance: >3 FB Neck ROM: Full    Dental no notable dental hx. (+) Teeth Intact, Dental Advisory Given,    Pulmonary shortness of breath and with exertion, asthma , pneumonia, resolved, former smoker,  Hx/o aspiration pneumonia 30 years ago after foot surgery   Pulmonary exam normal breath sounds clear to auscultation       Cardiovascular hypertension, Pt. on medications + CAD, + Past MI, + Cardiac Stents, +CHF and + DOE  Normal cardiovascular exam Rhythm:Regular Rate:Normal  NSTEMI, s/p DES x 2 mid & distal RCA 01/18/16)   Chronic diastolic CHF  Nuclear stress test 05/31/20: Nuclear stress EF: 75%. There was no ST segment deviation noted during stress. The study is normal. This is a low risk study. The left ventricular ejection fraction is hyperdynamic (>65%).   Neuro/Psych Seizures -, Well Controlled,  PSYCHIATRIC DISORDERS Anxiety Depression    GI/Hepatic Neg liver ROS, GERD  Medicated and Controlled,  Endo/Other  diabetes, Well Controlled, Type 2Abnormal mammogram left breast  Renal/GU Renal InsufficiencyRenal disease  negative genitourinary   Musculoskeletal  (+) Arthritis , Osteoarthritis,    Abdominal   Peds  Hematology  (+) anemia ,   Anesthesia Other Findings   Reproductive/Obstetrics                          Anesthesia Physical Anesthesia Plan  ASA: 3  Anesthesia Plan: General   Post-op Pain Management:    Induction: Intravenous  PONV Risk Score and Plan: 4 or greater and Treatment may vary due to age or medical condition and Ondansetron  Airway Management Planned: Oral ETT  Additional Equipment:   Intra-op  Plan:   Post-operative Plan: Extubation in OR  Informed Consent: I have reviewed the patients History and Physical, chart, labs and discussed the procedure including the risks, benefits and alternatives for the proposed anesthesia with the patient or authorized representative who has indicated his/her understanding and acceptance.     Dental advisory given  Plan Discussed with: CRNA and Anesthesiologist  Anesthesia Plan Comments: (PAT note written 01/17/2021 by Myra Gianotti, PA-C. )      Anesthesia Quick Evaluation

## 2021-01-20 ENCOUNTER — Encounter (HOSPITAL_COMMUNITY): Payer: Self-pay | Admitting: General Surgery

## 2021-01-20 ENCOUNTER — Other Ambulatory Visit: Payer: Self-pay

## 2021-01-20 ENCOUNTER — Ambulatory Visit (HOSPITAL_COMMUNITY)
Admission: RE | Admit: 2021-01-20 | Discharge: 2021-01-20 | Disposition: A | Discharge status: Home / Self Care | Payer: Medicare Other | Attending: General Surgery | Admitting: General Surgery

## 2021-01-20 ENCOUNTER — Ambulatory Visit (HOSPITAL_COMMUNITY): Payer: Medicare Other | Admitting: Vascular Surgery

## 2021-01-20 ENCOUNTER — Encounter (HOSPITAL_COMMUNITY)
Admission: RE | Disposition: A | Discharge status: Home / Self Care | Payer: Self-pay | Source: Home / Self Care | Attending: General Surgery

## 2021-01-20 ENCOUNTER — Ambulatory Visit (HOSPITAL_COMMUNITY): Payer: Medicare Other | Admitting: Anesthesiology

## 2021-01-20 ENCOUNTER — Other Ambulatory Visit: Payer: Self-pay | Admitting: General Surgery

## 2021-01-20 DIAGNOSIS — Z7984 Long term (current) use of oral hypoglycemic drugs: Secondary | ICD-10-CM | POA: Diagnosis not present

## 2021-01-20 DIAGNOSIS — Z79899 Other long term (current) drug therapy: Secondary | ICD-10-CM | POA: Diagnosis not present

## 2021-01-20 DIAGNOSIS — Z86718 Personal history of other venous thrombosis and embolism: Secondary | ICD-10-CM | POA: Insufficient documentation

## 2021-01-20 DIAGNOSIS — Z87891 Personal history of nicotine dependence: Secondary | ICD-10-CM | POA: Insufficient documentation

## 2021-01-20 DIAGNOSIS — Z885 Allergy status to narcotic agent status: Secondary | ICD-10-CM | POA: Diagnosis not present

## 2021-01-20 DIAGNOSIS — I2510 Atherosclerotic heart disease of native coronary artery without angina pectoris: Secondary | ICD-10-CM | POA: Insufficient documentation

## 2021-01-20 DIAGNOSIS — N6489 Other specified disorders of breast: Secondary | ICD-10-CM | POA: Insufficient documentation

## 2021-01-20 DIAGNOSIS — Z7951 Long term (current) use of inhaled steroids: Secondary | ICD-10-CM | POA: Diagnosis not present

## 2021-01-20 DIAGNOSIS — Z888 Allergy status to other drugs, medicaments and biological substances status: Secondary | ICD-10-CM | POA: Diagnosis not present

## 2021-01-20 DIAGNOSIS — Z955 Presence of coronary angioplasty implant and graft: Secondary | ICD-10-CM | POA: Diagnosis not present

## 2021-01-20 DIAGNOSIS — R928 Other abnormal and inconclusive findings on diagnostic imaging of breast: Secondary | ICD-10-CM | POA: Diagnosis present

## 2021-01-20 HISTORY — PX: RADIOACTIVE SEED GUIDED EXCISIONAL BREAST BIOPSY: SHX6490

## 2021-01-20 LAB — GLUCOSE, CAPILLARY
Glucose-Capillary: 125 mg/dL — ABNORMAL HIGH (ref 70–99)
Glucose-Capillary: 119 mg/dL — ABNORMAL HIGH (ref 70–99)

## 2021-01-20 SURGERY — RADIOACTIVE SEED GUIDED BREAST BIOPSY
Anesthesia: General | Site: Breast | Laterality: Left

## 2021-01-20 MED ORDER — OXYCODONE HCL 5 MG PO TABS: 5.0000 mg | ORAL_TABLET | Freq: Once | ORAL | Status: DC | PRN

## 2021-01-20 MED ORDER — CEFAZOLIN SODIUM-DEXTROSE 2-4 GM/100ML-% IV SOLN
INTRAVENOUS | Status: AC
  Filled 2021-01-20: qty 100

## 2021-01-20 MED ORDER — ACETAMINOPHEN 500 MG PO TABS: 1000.0000 mg | ORAL_TABLET | ORAL | Status: AC

## 2021-01-20 MED ORDER — ORAL CARE MOUTH RINSE: 15.0000 mL | Freq: Once | OROMUCOSAL | Status: AC

## 2021-01-20 MED ORDER — DEXAMETHASONE SODIUM PHOSPHATE 10 MG/ML IJ SOLN
INTRAMUSCULAR | Status: DC | PRN
  Administered 2021-01-20: 10 mg via INTRAVENOUS

## 2021-01-20 MED ORDER — CHLORHEXIDINE GLUCONATE CLOTH 2 % EX PADS: 6.0000 | MEDICATED_PAD | Freq: Once | CUTANEOUS | Status: DC

## 2021-01-20 MED ORDER — LIDOCAINE 2% (20 MG/ML) 5 ML SYRINGE
INTRAMUSCULAR | Status: DC | PRN
  Administered 2021-01-20: 30 mg via INTRAVENOUS

## 2021-01-20 MED ORDER — EPHEDRINE SULFATE 50 MG/ML IJ SOLN
INTRAMUSCULAR | Status: DC | PRN
  Administered 2021-01-20: 10 mg via INTRAVENOUS

## 2021-01-20 MED ORDER — MIDAZOLAM HCL 5 MG/5ML IJ SOLN
INTRAMUSCULAR | Status: DC | PRN
  Administered 2021-01-20: 2 mg via INTRAVENOUS

## 2021-01-20 MED ORDER — 0.9 % SODIUM CHLORIDE (POUR BTL) OPTIME
TOPICAL | Status: DC | PRN
  Administered 2021-01-20: 1000 mL

## 2021-01-20 MED ORDER — CHLORHEXIDINE GLUCONATE 0.12 % MT SOLN
OROMUCOSAL | Status: AC
  Administered 2021-01-20: 15 mL via OROMUCOSAL
  Filled 2021-01-20: qty 15

## 2021-01-20 MED ORDER — LACTATED RINGERS IV SOLN: INTRAVENOUS | Status: DC | PRN

## 2021-01-20 MED ORDER — FENTANYL CITRATE (PF) 250 MCG/5ML IJ SOLN
INTRAMUSCULAR | Status: AC
  Filled 2021-01-20: qty 5

## 2021-01-20 MED ORDER — GLYCOPYRROLATE 0.2 MG/ML IJ SOLN
INTRAMUSCULAR | Status: DC | PRN
  Administered 2021-01-20: .2 mg via INTRAVENOUS

## 2021-01-20 MED ORDER — LIDOCAINE HCL 1 % IJ SOLN
INTRAMUSCULAR | Status: DC | PRN
  Administered 2021-01-20: 40 mL

## 2021-01-20 MED ORDER — SUGAMMADEX SODIUM 200 MG/2ML IV SOLN
INTRAVENOUS | Status: DC | PRN
  Administered 2021-01-20: 400 mg via INTRAVENOUS

## 2021-01-20 MED ORDER — ROCURONIUM BROMIDE 10 MG/ML (PF) SYRINGE
PREFILLED_SYRINGE | INTRAVENOUS | Status: DC | PRN
  Administered 2021-01-20: 50 mg via INTRAVENOUS

## 2021-01-20 MED ORDER — ONDANSETRON HCL 4 MG/2ML IJ SOLN: 4.0000 mg | Freq: Once | INTRAMUSCULAR | Status: DC | PRN

## 2021-01-20 MED ORDER — BUPIVACAINE-EPINEPHRINE (PF) 0.25% -1:200000 IJ SOLN
INTRAMUSCULAR | Status: AC
  Filled 2021-01-20: qty 30

## 2021-01-20 MED ORDER — LIDOCAINE HCL (PF) 1 % IJ SOLN
INTRAMUSCULAR | Status: AC
  Filled 2021-01-20: qty 30

## 2021-01-20 MED ORDER — CHLORHEXIDINE GLUCONATE 0.12 % MT SOLN: 15.0000 mL | Freq: Once | OROMUCOSAL | Status: AC

## 2021-01-20 MED ORDER — FENTANYL CITRATE (PF) 100 MCG/2ML IJ SOLN
INTRAMUSCULAR | Status: AC
  Filled 2021-01-20: qty 2

## 2021-01-20 MED ORDER — OXYCODONE HCL 5 MG/5ML PO SOLN
5.0000 mg | Freq: Once | ORAL | Status: DC | PRN
Start: 2021-01-20 — End: 2021-01-20

## 2021-01-20 MED ORDER — ONDANSETRON HCL 4 MG/2ML IJ SOLN
INTRAMUSCULAR | Status: DC | PRN
  Administered 2021-01-20: 4 mg via INTRAVENOUS

## 2021-01-20 MED ORDER — ACETAMINOPHEN 500 MG PO TABS
ORAL_TABLET | ORAL | Status: AC
  Administered 2021-01-20: 1000 mg via ORAL
  Filled 2021-01-20: qty 2

## 2021-01-20 MED ORDER — LACTATED RINGERS IV SOLN: INTRAVENOUS | Status: DC

## 2021-01-20 MED ORDER — FENTANYL CITRATE (PF) 100 MCG/2ML IJ SOLN
25.0000 ug | INTRAMUSCULAR | Status: DC | PRN
  Administered 2021-01-20: 25 ug via INTRAVENOUS

## 2021-01-20 MED ORDER — PROPOFOL 10 MG/ML IV BOLUS
INTRAVENOUS | Status: DC | PRN
  Administered 2021-01-20: 110 mg via INTRAVENOUS

## 2021-01-20 MED ORDER — PROPOFOL 10 MG/ML IV BOLUS
INTRAVENOUS | Status: AC
  Filled 2021-01-20: qty 40

## 2021-01-20 MED ORDER — MIDAZOLAM HCL 2 MG/2ML IJ SOLN
INTRAMUSCULAR | Status: AC
  Filled 2021-01-20: qty 2

## 2021-01-20 MED ORDER — FENTANYL CITRATE (PF) 250 MCG/5ML IJ SOLN
INTRAMUSCULAR | Status: DC | PRN
  Administered 2021-01-20: 150 ug via INTRAVENOUS

## 2021-01-20 MED ORDER — CEFAZOLIN SODIUM-DEXTROSE 2-4 GM/100ML-% IV SOLN
2.0000 g | INTRAVENOUS | Status: AC
Start: 2021-01-20 — End: 2021-01-20
  Administered 2021-01-20: 2 g via INTRAVENOUS

## 2021-01-20 MED ORDER — OXYCODONE HCL 5 MG PO TABS: 5.0000 mg | ORAL_TABLET | Freq: Four times a day (QID) | ORAL | 0 refills | Status: DC | PRN

## 2021-01-20 SURGICAL SUPPLY — 53 items
ADH SKN CLS APL DERMABOND .7 (GAUZE/BANDAGES/DRESSINGS) ×1
APL PRP STRL LF DISP 70% ISPRP (MISCELLANEOUS) ×1
BAG COUNTER SPONGE SURGICOUNT (BAG) ×2 IMPLANT
BAG SPNG CNTER NS LX DISP (BAG) ×1
BINDER BREAST LRG (GAUZE/BANDAGES/DRESSINGS) IMPLANT
BINDER BREAST XLRG (GAUZE/BANDAGES/DRESSINGS) IMPLANT
BINDER BREAST XXLRG (GAUZE/BANDAGES/DRESSINGS) ×1 IMPLANT
BNDG COHESIVE 4X5 TAN STRL (GAUZE/BANDAGES/DRESSINGS) ×2 IMPLANT
CANISTER SUCT 3000ML PPV (MISCELLANEOUS) ×2 IMPLANT
CHLORAPREP W/TINT 26 (MISCELLANEOUS) ×2 IMPLANT
CLIP VESOCCLUDE LG 6/CT (CLIP) ×2 IMPLANT
CLIP VESOCCLUDE MED 24/CT (CLIP) ×2 IMPLANT
CLOSURE STERI-STRIP 1/4X4 (GAUZE/BANDAGES/DRESSINGS) ×1 IMPLANT
CNTNR URN SCR LID CUP LEK RST (MISCELLANEOUS) IMPLANT
CONT SPEC 4OZ STRL OR WHT (MISCELLANEOUS)
COVER PROBE W GEL 5X96 (DRAPES) ×2 IMPLANT
COVER SURGICAL LIGHT HANDLE (MISCELLANEOUS) ×2 IMPLANT
DERMABOND ADVANCED (GAUZE/BANDAGES/DRESSINGS) ×1
DERMABOND ADVANCED .7 DNX12 (GAUZE/BANDAGES/DRESSINGS) ×1 IMPLANT
DEVICE DUBIN SPECIMEN MAMMOGRA (MISCELLANEOUS) IMPLANT
DRAPE CHEST BREAST 15X10 FENES (DRAPES) ×2 IMPLANT
DRAPE SURG 17X23 STRL (DRAPES) IMPLANT
ELECT COATED BLADE 2.86 ST (ELECTRODE) ×2 IMPLANT
ELECT NDL BLADE 2-5/6 (NEEDLE) ×1 IMPLANT
ELECT NEEDLE BLADE 2-5/6 (NEEDLE) ×2 IMPLANT
ELECT REM PT RETURN 9FT ADLT (ELECTROSURGICAL) ×2
ELECTRODE REM PT RTRN 9FT ADLT (ELECTROSURGICAL) ×1 IMPLANT
GAUZE SPONGE 4X4 12PLY STRL LF (GAUZE/BANDAGES/DRESSINGS) ×1 IMPLANT
GLOVE SURG ENC MOIS LTX SZ6 (GLOVE) ×2 IMPLANT
GLOVE SURG UNDER LTX SZ6.5 (GLOVE) ×2 IMPLANT
GOWN STRL REUS W/ TWL LRG LVL3 (GOWN DISPOSABLE) ×1 IMPLANT
GOWN STRL REUS W/TWL 2XL LVL3 (GOWN DISPOSABLE) ×2 IMPLANT
GOWN STRL REUS W/TWL LRG LVL3 (GOWN DISPOSABLE) ×2
KIT BASIN OR (CUSTOM PROCEDURE TRAY) ×2 IMPLANT
KIT MARKER MARGIN INK (KITS) ×2 IMPLANT
LIGHT WAVEGUIDE WIDE FLAT (MISCELLANEOUS) IMPLANT
NDL 18GX1X1/2 (RX/OR ONLY) (NEEDLE) IMPLANT
NDL FILTER BLUNT 18X1 1/2 (NEEDLE) IMPLANT
NDL HYPO 25GX1X1/2 BEV (NEEDLE) ×1 IMPLANT
NEEDLE 18GX1X1/2 (RX/OR ONLY) (NEEDLE) IMPLANT
NEEDLE FILTER BLUNT 18X 1/2SAF (NEEDLE)
NEEDLE FILTER BLUNT 18X1 1/2 (NEEDLE) IMPLANT
NEEDLE HYPO 25GX1X1/2 BEV (NEEDLE) ×2 IMPLANT
NS IRRIG 1000ML POUR BTL (IV SOLUTION) ×2 IMPLANT
PACK GENERAL/GYN (CUSTOM PROCEDURE TRAY) ×2 IMPLANT
PACK UNIVERSAL I (CUSTOM PROCEDURE TRAY) ×2 IMPLANT
PAD ABD 7.5X8 STRL (GAUZE/BANDAGES/DRESSINGS) ×1 IMPLANT
STOCKINETTE IMPERVIOUS 9X36 MD (GAUZE/BANDAGES/DRESSINGS) ×2 IMPLANT
SUT MNCRL AB 4-0 PS2 18 (SUTURE) ×2 IMPLANT
SUT VIC AB 3-0 SH 8-18 (SUTURE) ×2 IMPLANT
SYR CONTROL 10ML LL (SYRINGE) ×2 IMPLANT
TOWEL GREEN STERILE (TOWEL DISPOSABLE) ×2 IMPLANT
TOWEL GREEN STERILE FF (TOWEL DISPOSABLE) ×2 IMPLANT

## 2021-01-20 NOTE — Op Note (Signed)
Left Breast Radioactive seed localized excisional biopsy  Indications: This patient presents with history of abnormal left mammogram with discordant core needle biopsy.    Pre-operative Diagnosis: abnormal left mammogram    Post-operative Diagnosis: abnormal left mammogram  Surgeon: Stark Klein   Anesthesia: General endotracheal anesthesia  ASA Class: 3  Procedure Details  The patient was seen in the Holding Room. The risks, benefits, complications, treatment options, and expected outcomes were discussed with the patient. The possibilities of bleeding, infection, the need for additional procedures, failure to diagnose a condition, and creating a complication requiring transfusion or operation were discussed with the patient. The patient concurred with the proposed plan, giving informed consent.  The site of surgery properly noted/marked. The patient was taken to Operating Room # 2, identified, and the procedure verified as left Breast seed localized excisional biopsy. A Time Out was held and the above information confirmed.  The left breast and chest were prepped and draped in standard fashion. A inferolateral circumareolar incision was made near the previously placed radioactive seed.  Dissection was carried down around the point of maximum signal intensity. The cautery was used to perform the dissection.   The specimen was inked with the margin marker paint kit.    Specimen radiography confirmed inclusion of the mammographic lesion, the clip, and the seed.  The background signal in the breast was zero.  One clip was placed in the breast cavity.  Hemostasis was achieved with cautery.  The wound was irrigated and closed with 3-0 vicryl interrupted deep dermal sutures and 4-0 monocryl running subcuticular suture.      Sterile dressings were applied. At the end of the operation, all sponge, instrument, and needle counts were correct.  Findings: Seed, clip in specimen.  Anterior and inferior  margins are skin, posterior margin is pectoralis  Estimated Blood Loss:  min         Specimens: left breast tissue with seed         Complications:  None; patient tolerated the procedure well.         Disposition: PACU - hemodynamically stable.         Condition: stable

## 2021-01-20 NOTE — Interval H&P Note (Signed)
History and Physical Interval Note:  01/20/2021 10:14 AM  [name]  has presented today for surgery, with the diagnosis of ABNORMAL LEFT MAMMOGRAM.  The various methods of treatment have been discussed with the patient and family. After consideration of risks, benefits and other options for treatment, the patient has consented to  Procedure(s): RADIOACTIVE SEED GUIDED EXCISIONAL LEFT BREAST BIOPSY (Left) as a surgical intervention.  The patient's history has been reviewed, patient examined, no change in status, stable for surgery.  I have reviewed the patient's chart and labs.  Questions were answered to the patient's satisfaction.     Stark Klein

## 2021-01-20 NOTE — Transfer of Care (Signed)
Immediate Anesthesia Transfer of Care Note  Patient: [name]  Procedure(s) Performed: RADIOACTIVE SEED GUIDED EXCISIONAL LEFT BREAST BIOPSY (Left: Breast)  Patient Location: PACU  Anesthesia Type:General  Level of Consciousness: awake and alert   Airway & Oxygen Therapy: Patient Spontanous Breathing and Patient connected to nasal cannula oxygen  Post-op Assessment: Report given to RN and Post -op Vital signs reviewed and stable  Post vital signs: Reviewed and stable  Last Vitals:  Vitals Value Taken Time  BP 156/74 01/20/21 1247  Temp 36.4 C 01/20/21 1245  Pulse 80 01/20/21 1249  Resp 13 01/20/21 1249  SpO2 91 % 01/20/21 1249  Vitals shown include unvalidated device data.  Last Pain:  Vitals:   01/20/21 1245  TempSrc:   PainSc: 0-No pain      Patients Stated Pain Goal: 3 (14/23/95 3202)  Complications: No notable events documented.

## 2021-01-20 NOTE — Anesthesia Postprocedure Evaluation (Signed)
Anesthesia Post Note  Patient: [name]  Procedure(s) Performed: RADIOACTIVE SEED GUIDED EXCISIONAL LEFT BREAST BIOPSY (Left: Breast)     Patient location during evaluation: PACU Anesthesia Type: General Level of consciousness: awake and alert and oriented Pain management: pain level controlled Vital Signs Assessment: post-procedure vital signs reviewed and stable Respiratory status: spontaneous breathing, nonlabored ventilation and respiratory function stable Cardiovascular status: blood pressure returned to baseline and stable Postop Assessment: no apparent nausea or vomiting Anesthetic complications: no   No notable events documented.  Last Vitals:  Vitals:   01/20/21 1315 01/20/21 1335  BP: 135/82 125/82  Pulse:  65  Resp:  12  Temp:    SpO2:  100%    Last Pain:  Vitals:   01/20/21 1245  TempSrc:   PainSc: 0-No pain                 Nerea Bordenave A.

## 2021-01-20 NOTE — Discharge Instructions (Addendum)
[facility] Office Phone Number [phone]  BREAST BIOPSY/ PARTIAL MASTECTOMY: POST OP INSTRUCTIONS  Always review your discharge instruction sheet given to you by the facility where your surgery was performed.  IF YOU HAVE DISABILITY OR FAMILY LEAVE FORMS, YOU MUST BRING THEM TO THE OFFICE FOR PROCESSING.  DO NOT GIVE THEM TO YOUR DOCTOR.  A prescription for pain medication may be given to you upon discharge.  Take your pain medication as prescribed, if needed.  If narcotic pain medicine is not needed, then you may take acetaminophen (Tylenol) or ibuprofen (Advil) as needed. Take your usually prescribed medications unless otherwise directed If you need a refill on your pain medication, please contact your pharmacy.  They will contact our office to request authorization.  Prescriptions will not be filled after 5pm or on week-ends. You should eat very light the first 24 hours after surgery, such as soup, crackers, pudding, etc.  Resume your normal diet the day after surgery. Most patients will experience some swelling and bruising in the breast.  Ice packs and a good support bra will help.  Swelling and bruising can take several days to resolve.  It is common to experience some constipation if taking pain medication after surgery.  Increasing fluid intake and taking a stool softener will usually help or prevent this problem from occurring.  A mild laxative (Milk of Magnesia or Miralax) should be taken according to package directions if there are no bowel movements after 48 hours. Unless discharge instructions indicate otherwise, you may remove your bandages 48 hours after surgery, and you may shower at that time.  You may have steri-strips (small skin tapes) in place directly over the incision.  These strips should be left on the skin for 7-10 days.   Any sutures or staples will be removed at the office during your follow-up visit. ACTIVITIES:  You may resume regular daily activities  (gradually increasing) beginning the next day.  Wearing a good support bra or sports bra (or the breast binder) minimizes pain and swelling.  You may have sexual intercourse when it is comfortable. You may drive when you no longer are taking prescription pain medication, you can comfortably wear a seatbelt, and you can safely maneuver your car and apply brakes. RETURN TO WORK:  __________1 week_______________ You should see your doctor in the office for a follow-up appointment approximately two weeks after your surgery.  Your doctor's nurse will typically make your follow-up appointment when she calls you with your pathology report.  Expect your pathology report 2-3 business days after your surgery.  You may call to check if you do not hear from us after three days.   WHEN TO CALL YOUR DOCTOR: Fever over 101.0 Nausea and/or vomiting. Extreme swelling or bruising. Continued bleeding from incision. Increased pain, redness, or drainage from the incision.  The clinic staff is available to answer your questions during regular business hours.  Please don't hesitate to call and ask to speak to one of the nurses for clinical concerns.  If you have a medical emergency, go to the nearest emergency room or call 911.  A surgeon from [facility] is always on call at the hospital.  For further questions, please visit [email or web address]   

## 2021-01-20 NOTE — H&P (Signed)
Subjective   Chief Complaint: breast   History of Present Illness: [name] is a 70 y.o. female who is seen today as an office consultation at the request of Dr. Truman Hayward for evaluation of breast .   Pt is a 70 yo F who is referred due to abnormal left mammogram. She had an abnormal screening mammogram and was thought to have an overlying skin fold. Diagnostic mammogram was performed and nothing was seen. However, she had a CT abd/pelvis 10/15/20 that showed two possible masses in the left breast. Ultrasound was performed and this showed an irregular mass at 5 o'clock 2 cm in greatest dimension. The second mass was found to be a simple benign cyst. Core needle biopsy showed [place] which is discordant.   Review of Systems: A complete review of systems was obtained from the patient. I have reviewed this information and discussed as appropriate with the patient. See HPI as well for other ROS.  Review of Systems  Cardiovascular: Positive for leg swelling.  Gastrointestinal: Positive for diarrhea.  Neurological: Positive for seizures.  Psychiatric/Behavioral: Positive for memory loss. The patient is nervous/anxious.    Medical History: Past Medical History:  Diagnosis Date   Anemia   Anxiety   Asthma, unspecified asthma severity, unspecified whether complicated, unspecified whether persistent   CHF (congestive heart failure) (CMS-HCC)   Chronic kidney disease   Diabetes mellitus without complication (CMS-HCC)   DVT (deep venous thrombosis) (CMS-HCC)   GERD (gastroesophageal reflux disease)   Glaucoma (increased eye pressure)   Hyperlipidemia   Hypertension   Seizures (CMS-HCC)   Patient Active Problem List  Diagnosis   Abnormal mammogram of left breast   Coronary artery disease involving native coronary artery without angina pectoris   History reviewed. No pertinent surgical history.   Allergies  Allergen Reactions   Tramadol Other (See Comments)  Seizure d/o   Amlodipine  Besylate Other (See Comments)  Reaction unknown   Lisinopril Other (See Comments)  Reaction unknown   Pantoprazole Sodium Other (See Comments)  Reaction unknown   Current Outpatient Medications on File Prior to Visit  Medication Sig Dispense Refill   albuterol 90 mcg/actuation inhaler Inhale into the lungs   diltiazem (TIAZAC) 240 MG ER capsule Take 1 capsule by mouth once daily   formoterol (PERFOROMIST) 20 mcg/2 mL nebulizer solution Inhale into the lungs   nitroGLYcerin (NITROSTAT) 0.4 MG SL tablet Place 1 tablet under the tongue every 5 (five) minutes as needed   ACCU-CHEK GUIDE TEST STRIPS test strip USE TO CHECK GLUCOSE ONCE DAILY   acetaminophen-codeine (TYLENOL #2) 300-15 mg tablet Take 1 tablet by mouth every 6 (six) hours as needed for Pain   ALPRAZolam (XANAX) 0.5 MG tablet Take 0.5 mg by mouth 2 (two) times daily   aspirin 81 MG EC tablet Take by mouth   atorvastatin (LIPITOR) 40 MG tablet Take 40 mg by mouth once daily   azithromycin (ZITHROMAX) 250 MG tablet Take 250 mg by mouth as directed   biotin 5 mg capsule Take 1 capsule by mouth once daily   budesonide (PULMICORT) 0.5 mg/2 mL nebulizer solution USE 1 VIAL IN NEBULIZER IN THE MORNING AND AT BEDTIME   chlorthalidone 25 MG tablet Take 25 mg by mouth once daily   clindamycin (CLEOCIN) 150 MG capsule TAKE 1 CAPSULE 4 TIMES A DAY   colestipoL (COLESTID) 1 gram tablet TAKE 1 TABLET BY MOUTH TWICE DAILY AS NEEDED FOR DIARRHEA   divalproex (DEPAKOTE ER) 500 MG ER tablet  Take 500 mg by mouth at bedtime   esomeprazole (NEXIUM) 40 MG DR capsule Take 40 mg by mouth 2 (two) times daily   famotidine (PEPCID) 20 MG tablet Take 20 mg by mouth once daily   fluticasone propionate (FLONASE) 50 mcg/actuation nasal spray Place into one nostril   hydrALAZINE (APRESOLINE) 50 MG tablet Take 50 mg by mouth 3 (three) times daily   ibuprofen (MOTRIN) 800 MG tablet TAKE 1 TABLET BY MOUTH FOUR TIMES A DAY AS NEEDED FOR PAIN   KLOR-CON M20 20 mEq  ER tablet Take 20 mEq by mouth once daily   Lacto.acidophilus-Bif.animalis 32 billion cell Cap Take by mouth   latanoprost (XALATAN) 0.005 % ophthalmic solution Place 1 drop into both eyes at bedtime   loratadine (CLARITIN) 10 mg tablet Take by mouth   memantine (NAMENDA) 10 MG tablet Take 10 mg by mouth 2 (two) times daily   metFORMIN (GLUCOPHAGE) 500 MG tablet Take by mouth   metroNIDAZOLE (FLAGYL) 250 MG tablet Take 250 mg by mouth 3 (three) times daily   montelukast (SINGULAIR) 10 mg tablet Take 10 mg by mouth at bedtime   multivitamin with minerals tablet Take 1 tablet by mouth once daily   nebivoloL (BYSTOLIC) 10 MG tablet Take 10 mg by mouth once daily   nystatin (MYCOSTATIN) 100,000 unit/mL suspension SWISH & SWALLOW 4 ML BY MOUTH 4 TIMES DAILY FOR 30 DAYS   peg-electrolyte (NULYTELY) solution Take by mouth as directed   predniSONE (DELTASONE) 10 MG tablet TAKE 4 TABLETS BY MOUTH ONCE DAILY FOR 1 DAY THEN 3 TABS ONCE DAILY FOR 1 DAY THEN 2 TABS ONCE DAILY FOR 1 DAY THEN 1 TAB ONCE DAILY FOR 1 DAY THEN STOP   promethazine-dextromethorphan (PROMETHAZINE-DM) 6.25-15 mg/5 mL syrup TAKE 5 ML BY MOUTH EVERY 6 HOURS AS NEEDED FOR COUGH   ranitidine (ZANTAC) 150 MG tablet Take by mouth   sucralfate (CARAFATE) 1 gram tablet TAKE 1 TABLET BY MOUTH THREE TIMES DAILY ON AN EMPTY STOMACH BEFORE A MEAL FOR 30 DAYS   No current facility-administered medications on file prior to visit.   History reviewed. No pertinent family history.   Social History   Tobacco Use  Smoking Status Former Smoker   Quit date: 2005   Years since quitting: 17.6  Smokeless Tobacco Never Used    Social History   Socioeconomic History   Marital status: Married  Tobacco Use   Smoking status: Former Smoker  Quit date: 2005  Years since quitting: 17.6   Smokeless tobacco: Never Used  Substance and Sexual Activity   Alcohol use: Never   Drug use: Never   Objective:   Vitals:  BP: (!) 146/80  Temp: 36.7 C  (98 F)  Weight: 70.4 kg (155 lb 3.2 oz)  Height: 154.9 cm (5\' 1" )   Body mass index is 29.32 kg/m.  Head: Normocephalic and atraumatic.  Mouth/Throat: Oropharynx is clear and moist. No oropharyngeal exudate.  Eyes: Conjunctivae are normal. Pupils are equal, round, and reactive to light. No scleral icterus.  Neck: Normal range of motion. Neck supple. No tracheal deviation present. No thyromegaly present.  Cardiovascular: Normal rate, regular rhythm, normal heart sounds and intact distal pulses. Exam reveals no gallop and no friction rub.  No murmur heard. Breast: left breast tender. Denser tissue in axillary tail. No palpable masses. No nipple retraction or nipple discharge. No LAD. No skin dimpling.  Respiratory: Effort normal and breath sounds normal. No respiratory distress. No wheezes, rales or rhonchi.  No chest wall tenderness.  GI: Soft. Bowel sounds are normal. Abdomen is soft, non tender, non distended. No masses or hepatosplenomegaly is present. There is no rebound and no guarding.  Musculoskeletal: . Extremities are non tender and without deformity.  Lymphadenopathy: No cervical or axillary adenopathy.  Neurological: Alert and oriented to person, place, and time. Coordination normal.  Skin: Skin is warm and dry. No rash noted. No diaphoresis. No erythema. No pallor.  Psychiatric: Normal mood and affect.Behavior is normal. Judgment and thought content normal.   Labs, Imaging and Diagnostic Testing: PSEUDOANGIOMATOUS STROMAL HYPERPLASIA ([place]). - FOCAL FIBROCYSTIC CHANGE WITH CALCIFICATIONS. - NO MALIGNANCY IDENTIFIED.  Images are from [facility]. Reviewed as well as reports.   Assessment and Plan:  Diagnoses and all orders for this visit:  Abnormal mammogram of left breast Assessment & Plan: Pt has a discordant core needle biopsy and will need a seed localized excisional biopsy. I discussed rationale for this. I reviewed seed placement. I discussed risks with patient, daughter,  and son (phone).   The surgical procedure was described to the patient. I discussed the incision type and location and that we would need radiology involved on with a wire or seed marker and/or sentinel node.   The risks and benefits of the procedure were described to the patient and she wishes to proceed.   We discussed the risks bleeding, infection, damage to other structures, need for further procedures/surgeries. We discussed the risk of seroma. The patient was advised if the area in the breast in cancer, we may need to go back to surgery for additional tissue to obtain negative margins or for a lymph node biopsy. The patient was advised that these are the most common complications, but that others can occur as well. They were advised against taking aspirin or other anti-inflammatory agents/blood thinners the week before surgery.   Coronary artery disease involving native coronary artery of native heart without angina pectoris Assessment & Plan: S/p stent placement 2017. Off blood thinners. Will contact Dr. Marlou Porch for risk stratification.      Georgianne Fick, MD

## 2021-01-20 NOTE — Anesthesia Procedure Notes (Signed)
Procedure Name: Intubation Date/Time: 01/20/2021 11:43 AM Performed by: Eligha Bridegroom, CRNA Pre-anesthesia Checklist: Patient identified, Emergency Drugs available, Suction available, Patient being monitored and Timeout performed Patient Re-evaluated:Patient Re-evaluated prior to induction Oxygen Delivery Method: Circle system utilized Preoxygenation: Pre-oxygenation with 100% oxygen Induction Type: IV induction Ventilation: Mask ventilation without difficulty and Oral airway inserted - appropriate to patient size Laryngoscope Size: Mac and 4 Grade View: Grade I Tube type: Oral Tube size: 7.0 mm Placement Confirmation: ETT inserted through vocal cords under direct vision, positive ETCO2 and breath sounds checked- equal and bilateral Secured at: 21 cm Tube secured with: Tape Dental Injury: Teeth and Oropharynx as per pre-operative assessment

## 2021-01-21 ENCOUNTER — Other Ambulatory Visit: Payer: Self-pay | Admitting: Internal Medicine

## 2021-01-21 ENCOUNTER — Encounter (HOSPITAL_COMMUNITY): Payer: Self-pay | Admitting: General Surgery

## 2021-01-21 ENCOUNTER — Telehealth: Payer: Self-pay | Admitting: Cardiology

## 2021-01-21 DIAGNOSIS — J410 Simple chronic bronchitis: Secondary | ICD-10-CM

## 2021-01-21 NOTE — Telephone Encounter (Signed)
Left a message for the pt to call back.  

## 2021-01-21 NOTE — Telephone Encounter (Signed)
Pt c/o swelling: STAT is pt has developed SOB within 24 hours  How much weight have you gained and in what time span?  154-158 IN 2 DAYS  If swelling, where is the swelling located?  ANKLES AND STOMACH  Are you currently taking a fluid pill? YES  Are you currently SOB? NO  Do you have a log of your daily weights (if so, list)? 154-158LBS IN 2 DAYS  Have you gained 3 pounds in a day or 5 pounds in a week?   Have you traveled recently? NO PT HAD BREAST SURGERY YESTERDAY AND SHE'S NOT SURE IF THE SWELLING IS COMING FROM THE SURGERY BUT SHE WAS ADVISED TO LET HER CARDIOLOGIST KNOW. PT HAVE NOT TAKEN ANY FLUID PILLS SINCE WEDNESDAY

## 2021-01-23 ENCOUNTER — Other Ambulatory Visit: Payer: Self-pay

## 2021-01-23 ENCOUNTER — Encounter (HOSPITAL_COMMUNITY): Payer: Self-pay | Admitting: Emergency Medicine

## 2021-01-23 ENCOUNTER — Observation Stay (HOSPITAL_COMMUNITY)
Admission: EM | Admit: 2021-01-23 | Discharge: 2021-01-23 | Disposition: A | Discharge status: Home / Self Care | Payer: Medicare Other | Attending: Internal Medicine | Admitting: Internal Medicine

## 2021-01-23 ENCOUNTER — Emergency Department (HOSPITAL_COMMUNITY): Payer: Medicare Other

## 2021-01-23 DIAGNOSIS — I252 Old myocardial infarction: Secondary | ICD-10-CM | POA: Diagnosis not present

## 2021-01-23 DIAGNOSIS — E785 Hyperlipidemia, unspecified: Secondary | ICD-10-CM | POA: Diagnosis present

## 2021-01-23 DIAGNOSIS — Z79899 Other long term (current) drug therapy: Secondary | ICD-10-CM | POA: Diagnosis not present

## 2021-01-23 DIAGNOSIS — E119 Type 2 diabetes mellitus without complications: Secondary | ICD-10-CM

## 2021-01-23 DIAGNOSIS — N1831 Chronic kidney disease, stage 3a: Secondary | ICD-10-CM | POA: Diagnosis not present

## 2021-01-23 DIAGNOSIS — N183 Chronic kidney disease, stage 3 unspecified: Secondary | ICD-10-CM | POA: Diagnosis present

## 2021-01-23 DIAGNOSIS — Z20822 Contact with and (suspected) exposure to covid-19: Secondary | ICD-10-CM | POA: Insufficient documentation

## 2021-01-23 DIAGNOSIS — R413 Other amnesia: Secondary | ICD-10-CM

## 2021-01-23 DIAGNOSIS — J4541 Moderate persistent asthma with (acute) exacerbation: Secondary | ICD-10-CM

## 2021-01-23 DIAGNOSIS — F418 Other specified anxiety disorders: Secondary | ICD-10-CM | POA: Diagnosis not present

## 2021-01-23 DIAGNOSIS — J45901 Unspecified asthma with (acute) exacerbation: Principal | ICD-10-CM | POA: Diagnosis present

## 2021-01-23 DIAGNOSIS — G40909 Epilepsy, unspecified, not intractable, without status epilepticus: Secondary | ICD-10-CM

## 2021-01-23 DIAGNOSIS — R0600 Dyspnea, unspecified: Secondary | ICD-10-CM

## 2021-01-23 DIAGNOSIS — F0390 Unspecified dementia without behavioral disturbance: Secondary | ICD-10-CM | POA: Diagnosis not present

## 2021-01-23 DIAGNOSIS — Z87891 Personal history of nicotine dependence: Secondary | ICD-10-CM | POA: Diagnosis not present

## 2021-01-23 DIAGNOSIS — I5032 Chronic diastolic (congestive) heart failure: Secondary | ICD-10-CM | POA: Diagnosis not present

## 2021-01-23 DIAGNOSIS — I2510 Atherosclerotic heart disease of native coronary artery without angina pectoris: Secondary | ICD-10-CM | POA: Diagnosis not present

## 2021-01-23 DIAGNOSIS — Z7984 Long term (current) use of oral hypoglycemic drugs: Secondary | ICD-10-CM | POA: Insufficient documentation

## 2021-01-23 DIAGNOSIS — E1122 Type 2 diabetes mellitus with diabetic chronic kidney disease: Secondary | ICD-10-CM | POA: Insufficient documentation

## 2021-01-23 DIAGNOSIS — I130 Hypertensive heart and chronic kidney disease with heart failure and stage 1 through stage 4 chronic kidney disease, or unspecified chronic kidney disease: Secondary | ICD-10-CM | POA: Insufficient documentation

## 2021-01-23 DIAGNOSIS — R062 Wheezing: Secondary | ICD-10-CM | POA: Diagnosis present

## 2021-01-23 DIAGNOSIS — R569 Unspecified convulsions: Secondary | ICD-10-CM

## 2021-01-23 LAB — CBC WITH DIFFERENTIAL/PLATELET
Abs Immature Granulocytes: 0.04 10*3/uL (ref 0.00–0.07)
Basophils Absolute: 0.1 10*3/uL (ref 0.0–0.1)
Basophils Relative: 1 %
Eosinophils Absolute: 0.1 10*3/uL (ref 0.0–0.5)
Eosinophils Relative: 1 %
HCT: 35.4 % — ABNORMAL LOW (ref 36.0–46.0)
Hemoglobin: 11.3 g/dL — ABNORMAL LOW (ref 12.0–15.0)
Immature Granulocytes: 1 %
Lymphocytes Relative: 36 %
Lymphs Abs: 3.1 10*3/uL (ref 0.7–4.0)
MCH: 30.6 pg (ref 26.0–34.0)
MCHC: 31.9 g/dL (ref 30.0–36.0)
MCV: 95.9 fL (ref 80.0–100.0)
Monocytes Absolute: 0.8 10*3/uL (ref 0.1–1.0)
Monocytes Relative: 10 %
Neutro Abs: 4.3 10*3/uL (ref 1.7–7.7)
Neutrophils Relative %: 51 %
Platelets: 268 10*3/uL (ref 150–400)
RBC: 3.69 MIL/uL — ABNORMAL LOW (ref 3.87–5.11)
RDW: 13.6 % (ref 11.5–15.5)
WBC: 8.4 10*3/uL (ref 4.0–10.5)
nRBC: 0 % (ref 0.0–0.2)

## 2021-01-23 LAB — BASIC METABOLIC PANEL
Anion gap: 9 (ref 5–15)
BUN: 26 mg/dL — ABNORMAL HIGH (ref 8–23)
CO2: 28 mmol/L (ref 22–32)
Calcium: 9.3 mg/dL (ref 8.9–10.3)
Chloride: 101 mmol/L (ref 98–111)
Creatinine, Ser: 1.35 mg/dL — ABNORMAL HIGH (ref 0.44–1.00)
GFR, Estimated: 42 mL/min — ABNORMAL LOW (ref >60)
Glucose, Bld: 110 mg/dL — ABNORMAL HIGH (ref 70–99)
Potassium: 3.8 mmol/L (ref 3.5–5.1)
Sodium: 138 mmol/L (ref 135–145)

## 2021-01-23 LAB — RESP PANEL BY RT-PCR (FLU A&B, COVID) ARPGX2
Influenza A by PCR: NEGATIVE
Influenza B by PCR: NEGATIVE
SARS Coronavirus 2 by RT PCR: NEGATIVE

## 2021-01-23 LAB — TROPONIN I (HIGH SENSITIVITY)
Troponin I (High Sensitivity): 7 ng/L (ref <18)
Troponin I (High Sensitivity): 5 ng/L (ref <18)

## 2021-01-23 MED ORDER — METHYLPREDNISOLONE SODIUM SUCC 125 MG IJ SOLR
125.0000 mg | Freq: Once | INTRAMUSCULAR | Status: AC
  Administered 2021-01-23: 125 mg via INTRAVENOUS
  Filled 2021-01-23: qty 2

## 2021-01-23 MED ORDER — IPRATROPIUM-ALBUTEROL 0.5-2.5 (3) MG/3ML IN SOLN
3.0000 mL | Freq: Once | RESPIRATORY_TRACT | Status: AC
  Administered 2021-01-23: 3 mL via RESPIRATORY_TRACT
  Filled 2021-01-23: qty 3

## 2021-01-23 MED ORDER — HYDROCODONE-ACETAMINOPHEN 5-325 MG PO TABS: 1.0000 | ORAL_TABLET | Freq: Four times a day (QID) | ORAL | 0 refills | Status: DC | PRN

## 2021-01-23 MED ORDER — PREDNISONE 10 MG PO TABS: 40.0000 mg | ORAL_TABLET | Freq: Every day | ORAL | 0 refills | Status: DC

## 2021-01-23 MED ORDER — MORPHINE SULFATE (PF) 2 MG/ML IV SOLN
2.0000 mg | Freq: Once | INTRAVENOUS | Status: AC
  Administered 2021-01-23: 2 mg via INTRAVENOUS
  Filled 2021-01-23: qty 1

## 2021-01-23 NOTE — Discharge Instructions (Addendum)
Return for any problem.  ?

## 2021-01-23 NOTE — ED Provider Notes (Addendum)
[facility] EMERGENCY DEPARTMENT Provider Note   CSN: [id number] Arrival date & time: 01/23/21  1136     History No chief complaint on file.   [name] is a 70 y.o. female.  Worse43 year old female with prior medical history as detailed below presents for evaluation.  Patient reports increased wheezing and asthma symptoms over the last 48 hours.  She reports use of home nebulizers without improvement.  Patient with longstanding history of asthma.  Patient reports that her symptoms and to the point where she felt that she needed to come to the hospital for treatment evaluation.  She denies associated fever or chest pain.  She denies recent use of steroids.  Patient with recent left breast biopsy performed on the 22nd.  Patient complains of pain to the left breast after this procedure.  She reports that she ran out of her pain medications yesterday.  The history is provided by the patient.  Shortness of Breath Severity:  Mild Onset quality:  Gradual Duration:  2 days Timing:  Constant Progression:  Worsening Chronicity:  New Context: activity   Relieved by:  Nothing Worsened by:  Nothing Associated symptoms: wheezing       Past Medical History:  Diagnosis Date   Anxiety    Arthritis    Asthma    Bronchitis    Chronic diastolic CHF (congestive heart failure) ([place]) 07/17/2016   Chronic kidney disease    Coronary artery disease    a. NSTEMI 12/2015 - LHC 01/18/16: s/p overlapping DESx2 to RCA, 10% ost-prox Cx,10% mLAD.   Dementia ([place])    Depression    Diabetes mellitus    Dyslipidemia    GERD (gastroesophageal reflux disease)    Heart attack ([place])    History of seizure    Hypertension    Hypertensive heart disease    NSTEMI (non-ST elevated myocardial infarction) ([place]) 12/2015   Pneumonia    Stage 3 chronic kidney disease ([place])     Patient Active Problem List   Diagnosis Date Noted   Abnormal mammogram of left breast 12/13/2020    Acute respiratory disease due to COVID-19 virus 06/16/2020   Wheezing 09/13/2019   Memory loss 03/20/2019   Severe persistent asthma without complication 76/16/0737   DM2 (diabetes mellitus, type 2) ([place]) 03/15/2018   CKD (chronic kidney disease), stage III ([place]) 03/15/2018   Dyspnea 08/12/2017   Macrocytic anemia 08/12/2017   Therapeutic drug monitoring 05/17/2017   Lung nodule 08/01/2016   Influenza B 07/18/2016   Chronic diastolic CHF (congestive heart failure) ([place]) 07/17/2016   Acute renal failure superimposed on stage 3 chronic kidney disease ([place]) 07/17/2016   Right leg swelling 02/27/2016   Acute respiratory failure with hypoxia (HCC)    Uncontrolled type 2 diabetes mellitus with complication (HCC)    Acute pulmonary embolism (HCC)    HLD (hyperlipidemia)    Seizures ([place]) 01/27/2016   Numbness 01/27/2016   CAD in native artery 01/20/2016   Hypertensive heart disease 01/20/2016   NSTEMI (non-ST elevated myocardial infarction) ([place]) 01/18/2016   Acute bronchitis with asthma with acute exacerbation 01/16/2016   Hypokalemia 01/16/2016   Hypomagnesemia 01/16/2016   Acute respiratory failure ([place]) 01/16/2016   Dyspnea on exertion    Asthma exacerbation 07/21/2015   Upper airway cough syndrome with VCD 08/28/2014   Diabetes mellitus type 2, controlled ([place]) 08/06/2014   Hyperlipidemia 08/06/2014   Allergic rhinitis 07/22/2008   OTHER DISEASES OF VOCAL CORDS 07/22/2008   DYSPNEA 07/22/2008  Diabetes mellitus type 2, uncontrolled ([place]) 07/21/2008   DIABETIC PERIPHERAL NEUROPATHY 07/21/2008   Depression with anxiety 07/21/2008   Essential hypertension 07/21/2008   Mild persistent chronic asthma without complication 62/56/3893   G E R D 07/21/2008   PULMONARY EMBOLISM, HX OF 07/21/2008    Past Surgical History:  Procedure Laterality Date   ABDOMINAL HYSTERECTOMY     CARDIAC CATHETERIZATION  1994   minimal LAD dz, no other dz, EF normal   CARDIAC CATHETERIZATION N/A  01/18/2016   Procedure: Left Heart Cath and Coronary Angiography;  Surgeon: Burnell Blanks, MD;  Location: [facility];  Service: Cardiovascular;  Laterality: N/A;   CARDIAC CATHETERIZATION N/A 01/18/2016   Procedure: Coronary Stent Intervention;  Surgeon: Burnell Blanks, MD;  Location: [facility];  Service: Cardiovascular;  Laterality: N/A;   COLONOSCOPY WITH PROPOFOL N/A 04/09/2014   Procedure: COLONOSCOPY WITH PROPOFOL;  Surgeon: Cleotis Nipper, MD;  Location: [facility];  Service: Endoscopy;  Laterality: N/A;   CORONARY STENT PLACEMENT  01/19/2016   STENT SYNERGY DES [id number] drug eluting stent was successfully placed, and overlaps the 2.5 x 38 mm Synergy stent placed distally.   ESOPHAGOGASTRODUODENOSCOPY (EGD) WITH PROPOFOL N/A 04/09/2014   Procedure: ESOPHAGOGASTRODUODENOSCOPY (EGD) WITH PROPOFOL;  Surgeon: Cleotis Nipper, MD;  Location: [facility];  Service: Endoscopy;  Laterality: N/A;   FOOT SURGERY Bilateral    approx ~2000   HEMORRHOID SURGERY     HERNIA REPAIR     KNEE ARTHROSCOPY     RADIOACTIVE SEED GUIDED EXCISIONAL BREAST BIOPSY Left 01/20/2021   Procedure: RADIOACTIVE SEED GUIDED EXCISIONAL LEFT BREAST BIOPSY;  Surgeon: Stark Klein, MD;  Location: [facility];  Service: General;  Laterality: Left;     OB History   No obstetric history on file.     Family History  Problem Relation Age of Onset   Allergies Mother    Asthma Mother    CAD Father 35   Diabetes Father    Hypertension Father    Congestive Heart Failure Sister        died in her 45s   CAD Sister 82    Social History   Tobacco Use   Smoking status: Former    Packs/day: 1.00    Years: 30.00    Pack years: 30.00    Types: Cigarettes    Quit date: 01/09/2004    Years since quitting: 17.0   Smokeless tobacco: Never  Vaping Use   Vaping Use: Never used  Substance Use Topics   Alcohol use: No    Alcohol/week: 0.0 standard drinks   Drug use: No    Home  Medications Prior to Admission medications   Medication Sig Start Date End Date Taking? Authorizing Provider  albuterol (PROAIR HFA) 108 (90 Base) MCG/ACT inhaler Inhale 2 puffs into the lungs every 4 (four) hours as needed for wheezing or shortness of breath. 04/08/19   Tanda Rockers, MD  albuterol (PROVENTIL) (2.5 MG/3ML) 0.083% nebulizer solution Take 2.5 mg by nebulization every 4 (four) hours as needed for wheezing or shortness of breath.    [provider]  ALPRAZolam Duanne Moron) 0.5 MG tablet Take 1 tablet (0.5 mg total) by mouth 2 (two) times daily as needed for anxiety. 07/31/15   Theodis Blaze, MD  atorvastatin (LIPITOR) 40 MG tablet Take 40 mg by mouth at bedtime.    [provider]  Biotin 5000 MCG CAPS Take 5,000 mcg by mouth daily.    [provider]  budesonide (PULMICORT) 0.5 MG/2ML nebulizer solution Take 2 mLs (0.5 mg total) by nebulization in the morning and at bedtime. Patient taking differently: Take 0.5 mg by nebulization daily. 01/13/20   Tanda Rockers, MD  chlorthalidone (HYGROTON) 25 MG tablet Take 1 tablet (25 mg total) by mouth daily. 02/10/20   Jerline Pain, MD  diltiazem (CARTIA XT) 240 MG 24 hr capsule Take 1 capsule (240 mg total) by mouth daily. 02/17/20   Jerline Pain, MD  divalproex (DEPAKOTE ER) 500 MG 24 hr tablet Take 1 tablet (500 mg total) by mouth at bedtime. 08/26/20   Suzzanne Cloud, NP  esomeprazole (NEXIUM) 40 MG capsule Take 40 mg by mouth 2 (two) times daily before a meal.    [provider]  fluticasone (FLONASE) 50 MCG/ACT nasal spray Place 2 sprays into both nostrils daily as needed for allergies.    [provider]  formoterol (PERFOROMIST) 20 MCG/2ML nebulizer solution USE 1 VIAL  IN  NEBULIZER TWICE  DAILY - morning and evening 01/21/21   Tanda Rockers, MD  hydrALAZINE (APRESOLINE) 50 MG tablet Take 1 tablet (50 mg total) by mouth 2 (two) times daily. 03/24/20   Jerline Pain, MD  latanoprost (XALATAN)  0.005 % ophthalmic solution Place 1 drop into both eyes at bedtime. 02/05/20   [provider]  memantine (NAMENDA) 10 MG tablet Take 1 tablet (10 mg total) by mouth 2 (two) times daily. 08/26/20   Suzzanne Cloud, NP  metFORMIN (GLUCOPHAGE) 500 MG tablet Take 500 mg by mouth 2 (two) times daily with a meal.    [provider]  montelukast (SINGULAIR) 10 MG tablet Take 1 tablet (10 mg total) by mouth at bedtime. 01/11/21   Tanda Rockers, MD  Multiple Vitamin (MULTIVITAMIN WITH MINERALS) TABS tablet Take 1 tablet by mouth daily.    [provider]  nebivolol (BYSTOLIC) 10 MG tablet Take 1 tablet (10 mg total) by mouth daily. 04/10/19   Jerline Pain, MD  nitroGLYCERIN (NITROSTAT) 0.4 MG SL tablet DISSOLVE ONE TABLET UNDER THE TONGUE EVERY 5 MINUTES AS NEEDED FOR CHEST PAIN.  DO NOT EXCEED A TOTAL OF 3 DOSES IN 15 MINUTES 11/19/19   Burtis Junes, NP  oxyCODONE (OXY IR/ROXICODONE) 5 MG immediate release tablet Take 1 tablet (5 mg total) by mouth every 6 (six) hours as needed for severe pain. 01/20/21   Stark Klein, MD  potassium chloride SA (K-DUR,KLOR-CON) 20 MEQ tablet Take 20 mEq by mouth daily.     [provider]  Probiotic Product (EQ PROBIOTIC) CAPS Take 1 capsule by mouth daily.    [provider]    Allergies    Tramadol  Review of Systems   Review of Systems  Respiratory:  Positive for shortness of breath and wheezing.   All other systems reviewed and are negative.  Physical Exam Updated Vital Signs BP (!) 184/89 (BP Location: Right Arm)   Pulse 73   Temp 97.8 F (36.6 C) (Oral)   Resp (!) 26   LMP  (LMP Unknown)   SpO2 100%   Physical Exam Vitals and nursing note reviewed.  Constitutional:      General: She is not in acute distress.    Appearance: Normal appearance. She is well-developed.  HENT:     Head: Normocephalic and atraumatic.  Eyes:     Conjunctiva/sclera: Conjunctivae normal.     Pupils: Pupils are equal, round,  and reactive to  light.  Cardiovascular:     Rate and Rhythm: Normal rate and regular rhythm.     Heart sounds: Normal heart sounds.  Pulmonary:     Effort: Pulmonary effort is normal. No respiratory distress.     Comments: Diffuse bilateral expiratory wheezes present on exam. Abdominal:     General: There is no distension.     Palpations: Abdomen is soft.     Tenderness: There is no abdominal tenderness.  Musculoskeletal:        General: No deformity. Normal range of motion.     Cervical back: Normal range of motion and neck supple.  Skin:    General: Skin is warm and dry.  Neurological:     General: No focal deficit present.     Mental Status: She is alert and oriented to person, place, and time.    ED Results / Procedures / Treatments   Labs (all labs ordered are listed, but only abnormal results are displayed) Labs Reviewed  RESP PANEL BY RT-PCR (FLU A&B, COVID) ARPGX2  BASIC METABOLIC PANEL  CBC WITH DIFFERENTIAL/PLATELET  TROPONIN I (HIGH SENSITIVITY)    EKG EKG Interpretation  Date/Time:  Sunday January 23 2021 11:53:25 EDT Ventricular Rate:  67 PR Interval:  161 QRS Duration: 88 QT Interval:  377 QTC Calculation: 398 R Axis:   21 Text Interpretation: Sinus rhythm Borderline T abnormalities, anterior leads Confirmed by Dene Gentry [phone]) on 01/23/2021 11:57:14 AM  Radiology DG Chest Portable 1 View  Result Date: 01/23/2021 CLINICAL DATA:  Chest pain and shortness of breath. EXAM: PORTABLE CHEST 1 VIEW COMPARISON:  06/16/2020 FINDINGS: Cardiac silhouette is normal in size. Right-sided coronary artery stent. No mediastinal or hilar masses. Clear lungs.  No pleural effusion or pneumothorax. Skeletal structures are grossly intact. IMPRESSION: No active disease. Electronically Signed   By: Lajean Manes M.D.   On: 01/23/2021 11:58    Procedures Procedures   Medications Ordered in ED Medications  ipratropium-albuterol (DUONEB) 0.5-2.5 (3) MG/3ML nebulizer  solution 3 mL (has no administration in time range)  methylPREDNISolone sodium succinate (SOLU-MEDROL) 125 mg/2 mL injection 125 mg (has no administration in time range)  morphine 2 MG/ML injection 2 mg (has no administration in time range)    ED Course  I have reviewed the triage vital signs and the nursing notes.  Pertinent labs & imaging results that were available during my care of the patient were reviewed by me and considered in my medical decision making (see chart for details).    MDM Rules/Calculators/A&P                           MDM  MSE complete  ROBERTHA STAPLES was evaluated in Emergency Department on 01/23/2021 for the symptoms described in the history of present illness. She was evaluated in the context of the global COVID-19 pandemic, which necessitated consideration that the patient might be at risk for infection with the SARS-CoV-2 virus that causes COVID-19. Institutional protocols and algorithms that pertain to the evaluation of patients at risk for COVID-19 are in a state of rapid change based on information released by regulatory bodies including the CDC and federal and state organizations. These policies and algorithms were followed during the patient's care in the ED.   Patient is presenting with complaint of increased wheezing and associated shortness of breath.  Patient reports that with use of home nebulizers her symptoms are not controlled.  Patient is visibly short of  breath on initial evaluation.  Repeat evaluation of demonstrates improvement.  However, patient is still wheezing.  Patient would likely benefit from admission for further treatment.  Hospitalist service is aware of case and will evaluate for admission.  1500 Patient seen by Dr. Lorin Mercy of the hospitalist service.  Patient now desires discharge home.  Will prescribe prednisone and a short course of pain medicine for home use.   Final Clinical Impression(s) / ED Diagnoses Final diagnoses:   Dyspnea, unspecified type  Moderate asthma with exacerbation, unspecified whether persistent    Rx / DC Orders ED Discharge Orders     None        Valarie Merino, MD 01/23/21 1428    Valarie Merino, MD 01/23/21 [phone]

## 2021-01-23 NOTE — Consult Note (Signed)
ER Visit   [name] BTD:[id number] DOB: 1951/04/03 DOA: 01/23/2021  PCP: [name], MD Consultants:  [facility] - cardiology; [facility] - surgery; [facility] - pulmonology; [facility] - podiatry; Krista Blue - neurology; [facility]ccini - GI Patient coming from: Home - lives with husband, daughter, grandchildren; NOK: Jacqueline, [name], [phone]  Chief Complaint: SOB  HPI: [name] is a 70 y.o. female with medical history significant of asthma; chronic diastolic CHF; stage 3 CKD; CAD; dementia; HLD; seizure; and HTN presenting with SOB. Symptoms started yesterday, maybe with the weather change.  She used breathing treatments but the wheezing was getting worse.  She has been having chest discomfort that she thought might be due to asthma or due to recent breast biopsy but it hurt and she couldn't breathe.  She feels iffy now.  Her chest feels tight, substernal region.  Mild wheezing now.    ED Course: Asthma - wheezing.  Breast biopsy, ran out of pain meds and this may be worsening her breathing.  100% on room air.   CXR ok. COVID/flu pending.    Review of Systems: As per HPI; otherwise review of systems reviewed and negative.   Ambulatory Status:  Ambulates without assistance   COVID Vaccine Status:   Complete plus booster  Past Medical History:  Diagnosis Date   Anxiety    Arthritis    Asthma    Chronic diastolic CHF (congestive heart failure) ([place]) 07/17/2016   Coronary artery disease    a. NSTEMI 12/2015 - LHC 01/18/16: s/p overlapping DESx2 to RCA, 10% ost-prox Cx,10% mLAD.   Dementia ([place])    Depression    Diabetes mellitus    Dyslipidemia    GERD (gastroesophageal reflux disease)    History of seizure    Hypertension    Hypertensive heart disease    NSTEMI (non-ST elevated myocardial infarction) ([place]) 12/2015   Stage 3 chronic kidney disease (HCC)     Past Surgical History:  Procedure Laterality Date   ABDOMINAL HYSTERECTOMY     CARDIAC CATHETERIZATION  1994   minimal LAD  dz, no other dz, EF normal   CARDIAC CATHETERIZATION N/A 01/18/2016   Procedure: Left Heart Cath and Coronary Angiography;  Surgeon: Burnell Blanks, MD;  Location: [place] [facility];  Service: Cardiovascular;  Laterality: N/A;   CARDIAC CATHETERIZATION N/A 01/18/2016   Procedure: Coronary Stent Intervention;  Surgeon: Burnell Blanks, MD;  Location: [place] [facility];  Service: Cardiovascular;  Laterality: N/A;   COLONOSCOPY WITH PROPOFOL N/A 04/09/2014   Procedure: COLONOSCOPY WITH PROPOFOL;  Surgeon: Cleotis Nipper, MD;  Location: [facility];  Service: Endoscopy;  Laterality: N/A;   CORONARY STENT PLACEMENT  01/19/2016   STENT SYNERGY DES 1.06Y69 drug eluting stent was successfully placed, and overlaps the 2.5 x 38 mm Synergy stent placed distally.   ESOPHAGOGASTRODUODENOSCOPY (EGD) WITH PROPOFOL N/A 04/09/2014   Procedure: ESOPHAGOGASTRODUODENOSCOPY (EGD) WITH PROPOFOL;  Surgeon: Cleotis Nipper, MD;  Location: [facility];  Service: Endoscopy;  Laterality: N/A;   FOOT SURGERY Bilateral    approx ~2000   HEMORRHOID SURGERY     HERNIA REPAIR     KNEE ARTHROSCOPY     RADIOACTIVE SEED GUIDED EXCISIONAL BREAST BIOPSY Left 01/20/2021   Procedure: RADIOACTIVE SEED GUIDED EXCISIONAL LEFT BREAST BIOPSY;  Surgeon: Stark Klein, MD;  Location: [place];  Service: General;  Laterality: Left;    Social History   Socioeconomic History   Marital status: Married    Spouse name: Not on file  Number of children: 3   Years of education: some college   Highest education level: Not on file  Occupational History   Occupation: Nutritional therapist  Tobacco Use   Smoking status: Former    Packs/day: 1.00    Years: 30.00    Pack years: 30.00    Types: Cigarettes    Quit date: 01/09/2004    Years since quitting: 17.0   Smokeless tobacco: Never  Vaping Use   Vaping Use: Never used  Substance and Sexual Activity   Alcohol use: No    Alcohol/week: 0.0 standard drinks   Drug use: No    Sexual activity: Not on file  Other Topics Concern   Not on file  Social History Narrative   Pt lives with husband in [place].   Right-handed.   2 cups caffeine per day.   Social Determinants of Health   Financial Resource Strain: Not on file  Food Insecurity: Not on file  Transportation Needs: Not on file  Physical Activity: Not on file  Stress: Not on file  Social Connections: Not on file  Intimate Partner Violence: Not on file    Allergies  Allergen Reactions   Tramadol Other (See Comments)    Seizure d/o    Family History  Problem Relation Age of Onset   Allergies Mother    Asthma Mother    CAD Father 103   Diabetes Father    Hypertension Father    Congestive Heart Failure Sister        died in her 56s   CAD Sister 74    Prior to Admission medications   Medication Sig Start Date End Date Taking? Authorizing Provider  albuterol (PROAIR HFA) 108 (90 Base) MCG/ACT inhaler Inhale 2 puffs into the lungs every 4 (four) hours as needed for wheezing or shortness of breath. 04/08/19   Tanda Rockers, MD  albuterol (PROVENTIL) (2.5 MG/3ML) 0.083% nebulizer solution Take 2.5 mg by nebulization every 4 (four) hours as needed for wheezing or shortness of breath.    [provider]  ALPRAZolam Duanne Moron) 0.5 MG tablet Take 1 tablet (0.5 mg total) by mouth 2 (two) times daily as needed for anxiety. 07/31/15   Theodis Blaze, MD  atorvastatin (LIPITOR) 40 MG tablet Take 40 mg by mouth at bedtime.    [provider]  Biotin 5000 MCG CAPS Take 5,000 mcg by mouth daily.    [provider]  budesonide (PULMICORT) 0.5 MG/2ML nebulizer solution Take 2 mLs (0.5 mg total) by nebulization in the morning and at bedtime. Patient taking differently: Take 0.5 mg by nebulization daily. 01/13/20   Tanda Rockers, MD  chlorthalidone (HYGROTON) 25 MG tablet Take 1 tablet (25 mg total) by mouth daily. 02/10/20   Jerline Pain, MD  diltiazem (CARTIA XT) 240 MG 24 hr capsule  Take 1 capsule (240 mg total) by mouth daily. 02/17/20   Jerline Pain, MD  divalproex (DEPAKOTE ER) 500 MG 24 hr tablet Take 1 tablet (500 mg total) by mouth at bedtime. 08/26/20   Suzzanne Cloud, NP  esomeprazole (NEXIUM) 40 MG capsule Take 40 mg by mouth 2 (two) times daily before a meal.    [provider]  fluticasone (FLONASE) 50 MCG/ACT nasal spray Place 2 sprays into both nostrils daily as needed for allergies.    [provider]  formoterol (PERFOROMIST) 20 MCG/2ML nebulizer solution USE 1 VIAL  IN  NEBULIZER TWICE  DAILY - morning and evening 01/21/21  Tanda Rockers, MD  hydrALAZINE (APRESOLINE) 50 MG tablet Take 1 tablet (50 mg total) by mouth 2 (two) times daily. 03/24/20   Jerline Pain, MD  latanoprost (XALATAN) 0.005 % ophthalmic solution Place 1 drop into both eyes at bedtime. 02/05/20   [provider]  memantine (NAMENDA) 10 MG tablet Take 1 tablet (10 mg total) by mouth 2 (two) times daily. 08/26/20   Suzzanne Cloud, NP  metFORMIN (GLUCOPHAGE) 500 MG tablet Take 500 mg by mouth 2 (two) times daily with a meal.    [provider]  montelukast (SINGULAIR) 10 MG tablet Take 1 tablet (10 mg total) by mouth at bedtime. 01/11/21   Tanda Rockers, MD  Multiple Vitamin (MULTIVITAMIN WITH MINERALS) TABS tablet Take 1 tablet by mouth daily.    [provider]  nebivolol (BYSTOLIC) 10 MG tablet Take 1 tablet (10 mg total) by mouth daily. 04/10/19   Jerline Pain, MD  nitroGLYCERIN (NITROSTAT) 0.4 MG SL tablet DISSOLVE ONE TABLET UNDER THE TONGUE EVERY 5 MINUTES AS NEEDED FOR CHEST PAIN.  DO NOT EXCEED A TOTAL OF 3 DOSES IN 15 MINUTES 11/19/19   Burtis Junes, NP  oxyCODONE (OXY IR/ROXICODONE) 5 MG immediate release tablet Take 1 tablet (5 mg total) by mouth every 6 (six) hours as needed for severe pain. 01/20/21   Stark Klein, MD  potassium chloride SA (K-DUR,KLOR-CON) 20 MEQ tablet Take 20 mEq by mouth daily.     [provider]   Probiotic Product (EQ PROBIOTIC) CAPS Take 1 capsule by mouth daily.    [provider]    Physical Exam: Vitals:   01/23/21 1200 01/23/21 1208 01/23/21 1230 01/23/21 1300  BP: 138/74 138/74 (!) 142/71 (!) 141/71  Pulse: 65 66 70 63  Resp: (!) 21 12 (!) 23 15  Temp:      TempSrc:      SpO2: 98% 100% 97% 100%     General:  Appears calm and comfortable and is in NAD, on RA Eyes:  PERRL, EOMI, normal lids, iris ENT:  grossly normal hearing, lips & tongue, mmm; appropriate dentition Neck:  no LAD, masses or thyromegaly Cardiovascular:  RRR, no m/r/g. Tr LE edema.  Respiratory:   CTA bilaterally with scant wheezing, no rales/rhonchi.  Normal to mildly increased respiratory effort. Breasts still tightly bandaged Abdomen:  soft, NT, ND Back:   normal alignment, no CVAT Skin:  no rash or induration seen on limited exam Musculoskeletal:  grossly normal tone BUE/BLE, good ROM, no bony abnormality Psychiatric:  grossly normal mood and affect, speech fluent and appropriate Neurologic:  CN 2-12 grossly intact, moves all extremities in coordinated fashion    Radiological Exams on Admission: Independently reviewed - see discussion in A/P where applicable  DG Chest Portable 1 View  Result Date: 01/23/2021 CLINICAL DATA:  Chest pain and shortness of breath. EXAM: PORTABLE CHEST 1 VIEW COMPARISON:  06/16/2020 FINDINGS: Cardiac silhouette is normal in size. Right-sided coronary artery stent. No mediastinal or hilar masses. Clear lungs.  No pleural effusion or pneumothorax. Skeletal structures are grossly intact. IMPRESSION: No active disease. Electronically Signed   By: Lajean Manes M.D.   On: 01/23/2021 11:58    EKG: Independently reviewed.  NSR with rate 67; nonspecific ST changes with no evidence of acute ischemia   Labs on Admission: I have personally reviewed the available labs and imaging studies at the time of the admission.  Pertinent labs:   Glucose 110 BUN  26/Creatinine  1.35/GFR 42 HS troponin 7 WBC 8.4 Hgb 11.3 A1c 6.0 on 9/16   Assessment/Plan Principal Problem:   Asthma exacerbation Active Problems:   Depression with anxiety   Diabetes mellitus type 2, controlled (HCC)   Hyperlipidemia   Seizures (HCC)   Chronic diastolic CHF (congestive heart failure) ([place])   DM2 (diabetes mellitus, type 2) (HCC)   CKD (chronic kidney disease), stage III (HCC)   Memory loss   Asthma with acute exacerbation -She has moderate to severe persistent asthma at baseline -Currently comfortable with generally normal respiratory effort, on RA -It seems appropriate to d/c with home nebs (Pulmicort.Formoterol BID, albuterol prn), as this is patient's current request -EDP to provide rx for prednisone -Continue Singulair   Stage 3a CKD -Baseline creatinine is about 1.2 -Today's creatinine is 1.35 -Generally stable   Seizure d/o -Continue Depakote   DM -Prior A1c is 6.4, indicating good control -Continue Glucophage   HTN -Continue Diltiazem, hydralazine, Bystolic, Chlorthalidone   HLD -Continue Lipitor   CAD -Interestingly, she does not appear to be taking ASA   Chronic diastolic CHF -Echo in 05/8839 showed preserved EF and grade 2 diastolic dysfunction -Appears to be compensated at this time   Mood/memory -Continue Xanax as needed -Continue Namenda   Breast biopsy -Pathology is pending and expected to be back tomorrow -Patient is requesting more pain medication, EDP to provide      Note: This patient has been tested and is negative for the novel coronavirus COVID-19. The patient has been fully vaccinated against COVID-19.    Thank you for this interesting consult.  Based on the overall stability at this time and her desire for d/c, discharge appears to be reasonable.   Karmen Bongo MD [facility]   How to contact the [facility] Attending or Consulting provider [place] or covering provider during after hours [place], for  this patient?  Check the care team in [facility] and look for a) attending/consulting [facility] provider listed and b) the [facility] team listed Log into [email or web address] and use [place]'s universal password to access. If you do not have the password, please contact the hospital operator. Locate the [facility] provider you are looking for under [facility] and page to a number that you can be directly reached. If you still have difficulty reaching the provider, please page the [facility] (Director on Call) for the Hospitalists listed on amion for assistance.   01/23/2021, 3:17 PM

## 2021-01-23 NOTE — ED Provider Notes (Signed)
Emergency Medicine Provider Triage Evaluation Note  [name] , a 70 y.o. female  was evaluated in triage.  Pt complains of shortness of breath, chest tightness, chest pain and cough.  Gradually worsening for the past few days but no worse this morning.  Minimal improvement noted with home albuterol.  Review of Systems  Positive: Chest pain, shortness of breath, wheezing Negative: Fever   Physical Exam  BP (!) 184/89 (BP Location: Right Arm)   Pulse 73   Temp 97.8 F (36.6 C) (Oral)   Resp (!) 26   LMP  (LMP Unknown)   SpO2 100%  Gen:   Awake, no distress   Resp:  Normal effort  MSK:   Moves extremities without difficulty  Other:  Tightness noted in bilateral lung fields patient tachypneic  Medical Decision Making  Medically screening exam initiated at 11:43 AM.  Appropriate orders placed.  [name] was informed that the remainder of the evaluation will be completed by another provider, this initial triage assessment does not replace that evaluation, and the importance of remaining in the ED until their evaluation is complete.  Taken to room   Delia Heady, PA-C 01/23/21 1143    Fredia Sorrow, MD 01/31/21 [phone]

## 2021-01-23 NOTE — ED Triage Notes (Signed)
Hx of asthma.  PT reports increased SOB this morning.  Using inhalers and nebs without relief.  Audible wheezing on arrival.

## 2021-01-24 LAB — SURGICAL PATHOLOGY

## 2021-01-25 NOTE — Telephone Encounter (Signed)
Pt was seen and treated for asthma in ED 01/23/2021

## 2021-01-25 NOTE — Telephone Encounter (Signed)
Left message to call back  

## 2021-01-31 ENCOUNTER — Telehealth: Payer: Self-pay | Admitting: Cardiology

## 2021-01-31 ENCOUNTER — Other Ambulatory Visit: Payer: Self-pay | Admitting: Internal Medicine

## 2021-01-31 NOTE — Telephone Encounter (Signed)
Reviewed this information with Dr Marlou Porch who advised pt to take Furosemide 40 mg once a day for 7 days and increase potassium chloride to 40 MEQ daily for 7 days.  Attempted to contact pt to discuss but had to lm to c/b.

## 2021-01-31 NOTE — Telephone Encounter (Signed)
Pt c/o swelling: STAT is pt has developed SOB within 24 hours  If swelling, where is the swelling located? Not sure   How much weight have you gained and in what time span? 6 pounds over from Friday to Monday  Have you gained 3 pounds in a day or 5 pounds in a week? 5   Do you have a log of your daily weights (if so, list)? yes  Are you currently taking a fluid pill? yes  Are you currently SOB? No   Have you traveled recently? No    Crystal Lanier Clam from [facility] calling concerning this patient. Please follow up with the patient.

## 2021-02-01 NOTE — Telephone Encounter (Signed)
Attempted to call pt back to discuss her concerns.  Received VM.  LM to CB if she is still having issues.

## 2021-02-01 NOTE — Telephone Encounter (Signed)
Follow Up:    Patient is returning Pam's call from yesterday, concerning her weight gain.

## 2021-02-02 NOTE — Telephone Encounter (Signed)
Left message for patient to call back  

## 2021-02-02 NOTE — Telephone Encounter (Signed)
Attempted again to contact pt by phone.  NA.  LM to CB.

## 2021-02-02 NOTE — Telephone Encounter (Signed)
Patient was returning call and to say that she is very swollen. Please advise

## 2021-02-03 ENCOUNTER — Other Ambulatory Visit: Payer: Self-pay | Admitting: Cardiology

## 2021-02-03 NOTE — Telephone Encounter (Signed)
Spoke with pt this AM.  She reports she is doing some better today.  She never listened to the message left for her previously but increased her Furosemide to 40 mg a day 2 days ago.  Today her weight is 158 lbs down from 164 lbs.  She reports she has been wearing her knee high compression stockings and her legs are not as swollen.  She will follow Dr Marlou Porch orders for a total of 7 days and c/b if no improvement.

## 2021-02-20 ENCOUNTER — Other Ambulatory Visit: Payer: Self-pay | Admitting: Cardiology

## 2021-02-25 DIAGNOSIS — N6489 Other specified disorders of breast: Secondary | ICD-10-CM | POA: Insufficient documentation

## 2021-03-11 ENCOUNTER — Ambulatory Visit: Payer: Medicare Other | Admitting: Internal Medicine

## 2021-03-15 ENCOUNTER — Other Ambulatory Visit: Payer: Self-pay | Admitting: *Deleted

## 2021-03-15 ENCOUNTER — Encounter: Payer: Self-pay | Admitting: Internal Medicine

## 2021-03-15 ENCOUNTER — Other Ambulatory Visit: Payer: Self-pay

## 2021-03-15 ENCOUNTER — Ambulatory Visit (INDEPENDENT_AMBULATORY_CARE_PROVIDER_SITE_OTHER): Payer: Medicare Other | Admitting: Internal Medicine

## 2021-03-15 DIAGNOSIS — J4530 Mild persistent asthma, uncomplicated: Secondary | ICD-10-CM | POA: Diagnosis not present

## 2021-03-15 DIAGNOSIS — R058 Other specified cough: Secondary | ICD-10-CM

## 2021-03-15 MED ORDER — DIVALPROEX SODIUM ER 500 MG PO TB24: 500.0000 mg | ORAL_TABLET | Freq: Every day | ORAL | 1 refills | Status: DC

## 2021-03-15 MED ORDER — MEMANTINE HCL 10 MG PO TABS: 10.0000 mg | ORAL_TABLET | Freq: Two times a day (BID) | ORAL | 1 refills | Status: DC

## 2021-03-15 NOTE — Progress Notes (Signed)
Subjective:   Patient ID: [name], female    DOB: October 19, 1950    MRN: [id number]    Brief patient profile:  70   yobf quit smoking 2005 with nl pfts 2012  with recurrent non-specific resp flares with nl pfts during flares c/w pseudoasthma.     History of Present Illness  01/24/2016  Extended post hosp f/u ov/ transition of care/ /[name] re: dtca asthma/ vcd symb 160 and pred 5 x 10 mg daily  Chief Complaint  Patient presents with   Follow-up    Pt. recently got out the [facility] for an asthma attack, Pt. states her breathing remains the same, coughing with some yellow to white mucus,Has been needing to use Ventolin more often  last doing well 2 months prior to OV   While on Symb 160/ gerd rx but not dosing ac as rec  Waking up prematurely x 2 months with cough/wheeze/ sob but never contacted me as per the instructions rec Plan A = Automatic = dulera 100 Take 2 puffs first thing in am and then another 2 puffs about 12 hours later.                                      Nexum 40 mg Take 30-60 min before first meal of the day and Pepcid AC 20 mg  at bedtime Work on inhaler technique  Plan B = Backup Only use your albuterol as a rescue medication Plan C = Crisis - only use your albuterol nebulizer if you first try Plan B and it fails to help > ok to use the nebulizer up to every 4 hours but if start needing it regularly call for immediate appointment GERD diet  Take delsym two tsp every 12 hours and use the flutter valve as much as possible and supplement if needed with  tramadol 50 mg up to 2 every 4 hours to suppress the urge to cough  Once you have eliminated the cough for 3 straight days try reducing the tramadol first,  then the delsym as tolerated.   Prednisone 5 mg x 8, then 6, then 4 then 2, then 1 daily x 2 days and off  please schedule a follow up office visit in 2 weeks, sooner if needed with all meds/ inhalers/ neb solutions in hand     09/18/2016  f/u ov/[name] re:  UACS vs  Asthma brought med calendar but not updated / on symb 160 / max gerd rx  Chief Complaint  Patient presents with   Follow-up    Pt c/o occ wheezing. She has not used rescue inhaler or neb recently.   still feels wheezing  And doe = MMRC1 = can walk nl pace, flat grade, can't hurry or go uphills or steps s sob rec Change the symbicort to 80 Take 2 puffs first thing in am and then another 2 puffs about 12 hours later.  Work on inhaler technique:  Use the med calendar daily to organize your medications as a "checklist" See [name] in 4  weeks with all your medications> never happened    Admit date: 12/19/2017 Discharge date: 12/24/2017    D/c home with steroids taper, monitor blood glucose while on steroids She reports run out of pulmicort nebs, new prescription provided, she is to follow up with [name] to further discuss asthma/copd meds, she is concerned about cost of these  meds   Discharge Diagnoses:      Active Hospital Problems    Diagnosis Date Noted   Acute bronchitis with asthma with acute exacerbation 01/16/2016   Acute renal failure superimposed on stage 3 chronic kidney disease (HCC) 07/17/2016   Chronic diastolic CHF (congestive heart failure) ([place]) 07/17/2016   Acute respiratory failure with hypoxia (HCC)     Upper airway cough syndrome vs Asthma  08/28/2014   Diabetes mellitus type 2, controlled ([place]) 08/06/2014   Essential hypertension 07/21/2008            12/28/2017 ext post hosp f/u ov/[name] re: confused with meds again / only slightly better breathing  Chief Complaint  Patient presents with   Follow-up    Breathing has improved slightly. She has been out of brovana due to high cost x 3 days. She has had to use her albuterol inhaler and neb daily since then.   Dyspnea:  50 ft = About the same as usual Cough: after supper is the worse, but not much mucus Sleeping: wakes up coughing 30 degrees due to sensation of reflux    SABA use: last 5 h prior to OV   02:  none  Sleeping at 30 degrees    rec Plan A = Automatic = Performist 20 mcg one vial  with pulmocort one half vial twice daily  Gabapentin 100 mg three times daily Nexium Take 30- 60 min before your first and last meals of the day and zantac (ranitidine) at bedtime  Along with chlorpheniramine 4 mg 1 or 2 also at bedtime (over the counter)  Continue prednisone 10 mg every day  Plan B = Backup for breathing Only use your albuterol as a rescue medication  Plan B = Backup for coughing  Add tramadol 50 mg 1-2 every 4 hours if needed  Plan C = Crisis - only use your albuterol nebulizer if you first try Plan B and it fails to help > ok to use the nebulizer up to every 4 hours but if start needing it regularly call for immediate appointment     03/11/2018  f/u ov/[name] re: pseudoasthma > asthma Chief Complaint  Patient presents with   Follow-up    Cough had improved and then started back 1 day ago.  Cough is now non prod.  She is using her albuterol inhaler 2 x daily on average. She has not needed albuterol neb.    Dyspnea:  MMRC2 = can't walk a nl pace on a flat grade s sob but does fine slow and flat slowed by knee  Cough: noct > day  Sleeping: 45 degrees due to subjective reflux despite max acid suppresion  SABA use: as above - never noct 02: none   rec Gabapentin 100 mg four  times daily  Take chortab 4 mg x 2 at bedtime and up to every 4 hours during the day as needed for drainage  Take delsym two tsp every 12 hours and supplement if needed with  tramadol 50 mg up to 2 every 4 hours to suppress the urge to cough. Swallowing water and/or using ice chips/non mint and menthol containing candies (such as lifesavers or sugarless jolly ranchers) are also effective.  You should rest your voice and avoid activities that you know make you cough. Once you have eliminated the cough for 3 straight days try reducing the tramadol first,  then the delsym as tolerated.  Plan D = Prednisone x 6 days if  not cough/ sob  not better See calendar for specific medication instructions and bring it back for each and every office visit for every healthcare provider you see.  Without it,  you may not receive the best quality medical care that we feel you deserve. Please schedule a follow up office visit in 4 weeks, call sooner if needed with all medications /inhalers/ solutions in hand so we can verify exactly what you are taking. This includes all medications from all doctors and over the [place] separate them into two bags:  the ones you take automatically, no matter what, vs the ones you take just when you feel you need them "BAG #2 is UP TO YOU"  - this will really help [place] help you take your medications more effectively.    03/15/18 back to ER - did not activate action plan prior (never took more than 1 tramadol every 12 hours to control cough.   03/20/2018  Extended post hops  f/u ov/Logun Colavito re: transition of care re   vcd vs asthma  Chief Complaint  Patient presents with   Follow-up    recent admission 03/15/18- pt reports of wheezing, sob with exertion, no prod cough & chest tightness.   Dyspnea:  MMRC4  = sob if tries to leave home or while getting dressed   - no better now vs during hosp stay. Seen by Jacqueline [place] Bells who strongly supported vcd dx during admit Cough: dry cough settles down overnight and with plb as does the "wheeze"  Sleeping: 45 degrees SABA use: last neb 8 h prior to OV  "did not help" 02: no  rec Increase gabapentin to 100 mg take 2 (=200)  four times daily as per med calendar For cough > delsym 2 tsp every 12 hours with use of flutter valve as much as possible  and supplement with tramadol 50 mg up to 1-2 every 4 hours as needed  For breathing problems not improving with use of nebulizer > Prednisone 10 mg take  4 each am x 2 days,   2 each am x 2 days,  1 each am x 2 days and stop  GERD diet / lifestyle recs Keep previous appt with your medications in 2 bags    02/04/19  televisit Stop gabapentin completely since you are only taking one at bedtime but add it back if start coughing again for any reason and ok to build up to 4 x daily  If your breathing gets worse, increase the Performist to 20 mcg (one vial) every 12 hours instead of just once a day.    01/12/2020  f/u ov/Umberto Pavek re:   Asthma with uacs / continues to struggle with concept of med reconciliation Chief Complaint  Patient presents with   Follow-up    "feeling good"  Dyspnea:  Walking neighborhood, chasing kids at age 32 and 50 Cough: none / still hoarse and clearing throat daytime  Sleeping : no resp symptoms  02 none  rec Plan A = Automatic = Always=    Performist and the budesonide twice daily  And continue esmeprazole 40 mg Take 30-60 min before first meal of the day and 20 mg Take 30-60 min before last meal of the day  Plan B = Backup (to supplement plan A, not to replace it) Only use your albuterol inhaler as a rescue medication Plan C = Crisis (instead of Plan B but only if Plan B stops working) - only use your albuterol nebulizer if you first try Plan B  Plan D = Deltasone if ABC not working  - Prednisone 10 mg take  4 each am x 2 days,   2 each am x 2 days,  1 each am x 2 days and stop     09/08/2020  f/u ov/Naoko Diperna re: AB/ uacs much better on performist/bud with still poor hfa  Chief Complaint  Patient presents with   Follow-up    Pt states she has been doing well since last visit. States her cough is gone and denies any current complaints.   Dyspnea:  No limits  Cough: none  Sleeping: ok flat bed/ 2 pillows  SABA use: none  02: none Covid status:   vax x 3  Rec Ok to reduce your perforomist / budesonide once daily as long as doing great - otherwise twice daily  Esomeprazole is Take 30- 60 min before your first and last meals of the day     03/15/2021  f/u ov/Aran Menning re: AB  maint on perfomist/bud/ singulair each am   Chief Complaint  Patient presents with   Follow-up     Lumpectomy 9/22.  Breathing is ok.  Dyspnea:  Not limited by breathing from desired activities  daily walks thru neighborhood x 30 min some hills Cough: none  Sleeping: on either side flat bed 2 pillows  SABA use: with cold  02: none  Covid status:   vax x 4    No obvious day to day or daytime variability or assoc excess/ purulent sputum or mucus plugs or hemoptysis or cp or chest tightness, subjective wheeze or overt sinus or hb symptoms.   Sleeping  without nocturnal  or early am exacerbation  of respiratory  c/o's or need for noct saba. Also denies any obvious fluctuation of symptoms with weather or environmental changes or other aggravating or alleviating factors except as outlined above   No unusual exposure hx or h/o childhood pna/ asthma or knowledge of premature birth.  Current Allergies, Complete Past Medical History, Past Surgical History, Family History, and Social History were reviewed in [employer] record.  ROS  The following are not active complaints unless bolded Hoarseness, sore throat, dysphagia, dental problems, itching, sneezing,  nasal congestion or discharge of excess mucus or purulent secretions, ear ache,   fever, chills, sweats, unintended wt loss or wt gain, classically pleuritic or exertional cp,  orthopnea pnd or arm/hand swelling  or leg swelling, presyncope, palpitations, abdominal pain, anorexia, nausea, vomiting, diarrhea  or change in bowel habits or change in bladder habits, change in stools or change in urine, dysuria, hematuria,  rash, arthralgias, visual complaints, headache, numbness, weakness or ataxia or problems with walking or coordination,  change in mood or  memory.        Current Meds  Medication Sig   albuterol (PROAIR HFA) 108 (90 Base) MCG/ACT inhaler Inhale 2 puffs into the lungs every 4 (four) hours as needed for wheezing or shortness of breath.   albuterol (PROVENTIL) (2.5 MG/3ML) 0.083% nebulizer solution Take 2.5 mg  by nebulization every 4 (four) hours as needed for wheezing or shortness of breath.   ALPRAZolam (XANAX) 0.5 MG tablet Take 1 tablet (0.5 mg total) by mouth 2 (two) times daily as needed for anxiety.   atorvastatin (LIPITOR) 40 MG tablet Take 40 mg by mouth at bedtime.   Biotin 5000 MCG CAPS Take 5,000 mcg by mouth daily.   budesonide (PULMICORT) 0.5 MG/2ML nebulizer solution USE 1 VIAL IN NEBULIZER IN THE MORNING AND AT  BEDTIME   chlorthalidone (HYGROTON) 25 MG tablet Take 1 tablet by mouth once daily   diltiazem (CARDIZEM CD) 240 MG 24 hr capsule TAKE 1 CAPSULE BY MOUTH DAILY   divalproex (DEPAKOTE ER) 500 MG 24 hr tablet Take 1 tablet (500 mg total) by mouth at bedtime.   esomeprazole (NEXIUM) 40 MG capsule Take 40 mg by mouth 2 (two) times daily before a meal.   fluticasone (FLONASE) 50 MCG/ACT nasal spray Place 2 sprays into both nostrils daily as needed for allergies.   formoterol (PERFOROMIST) 20 MCG/2ML nebulizer solution USE 1 VIAL  IN  NEBULIZER TWICE  DAILY - morning and evening   hydrALAZINE (APRESOLINE) 50 MG tablet Take 1 tablet (50 mg total) by mouth 2 (two) times daily.   latanoprost (XALATAN) 0.005 % ophthalmic solution Place 1 drop into both eyes at bedtime.   memantine (NAMENDA) 10 MG tablet Take 1 tablet (10 mg total) by mouth 2 (two) times daily.   metFORMIN (GLUCOPHAGE) 500 MG tablet Take 500 mg by mouth 2 (two) times daily with a meal.   montelukast (SINGULAIR) 10 MG tablet Take 1 tablet (10 mg total) by mouth at bedtime.   Multiple Vitamin (MULTIVITAMIN WITH MINERALS) TABS tablet Take 1 tablet by mouth daily.   nebivolol (BYSTOLIC) 10 MG tablet Take 1 tablet (10 mg total) by mouth daily.   nitroGLYCERIN (NITROSTAT) 0.4 MG SL tablet DISSOLVE ONE TABLET UNDER THE TONGUE EVERY 5 MINUTES AS NEEDED FOR CHEST PAIN.  DO NOT EXCEED A TOTAL OF 3 DOSES IN 15 MINUTES   potassium chloride SA (K-DUR,KLOR-CON) 20 MEQ tablet Take 20 mEq by mouth daily.    Probiotic Product (EQ PROBIOTIC)  CAPS Take 1 capsule by mouth daily.                            Objective:   Physical Exam  03/15/2021    161  09/08/2020      153 01/12/2020      150  09/09/2019     147  03/12/2019   157  11/04/2018       157  02/23/2016   168 >  04/07/2016  168 > 05/08/2016    168 >  07/17/2016  162 > 09/18/2016    164  > 09/18/2017  160 > 12/28/2017 167  > 03/11/2018  164 > 03/20/2018   160 > 04/05/2018   171 > 04/18/2018  165   Vital signs reviewed  03/15/2021  - Note at rest 02 sats  96% on RA   General appearance:    amb bf nad   HEENT : pt wearing mask not removed for exam due to covid -19 concerns.    NECK :  without JVD/Nodes/TM/ nl carotid upstrokes bilaterally   LUNGS: no acc muscle use,  Nl contour chest which is clear to A and P bilaterally without cough on insp or exp maneuvers   CV:  RRR  no s3 or murmur or increase in P2, and no edema   ABD:  soft and nontender with nl inspiratory excursion in the supine position. No bruits or organomegaly appreciated, bowel sounds nl  MS:  Nl gait/ ext warm without deformities, calf tenderness, cyanosis or clubbing No obvious joint restrictions   SKIN: warm and dry without lesions    NEURO:  alert, approp, nl sensorium with  no motor or cerebellar deficits apparent.

## 2021-03-15 NOTE — Patient Instructions (Signed)
No change in medications   Please schedule a follow up visit in 12  months but call sooner if needed  

## 2021-03-17 ENCOUNTER — Encounter: Payer: Self-pay | Admitting: Internal Medicine

## 2021-03-17 NOTE — Assessment & Plan Note (Signed)
-   CT sinus 08/06/14 > neg  - Allergy profile 02/23/16  >  Eos 0.0 /  IgE  87 pos  RAST  Grass/ trees  - improved 05/08/2016 on gabapentin 300 tid > no change rx > d/c'd 07/2016 DgEs 10/09/17  Low range of penetration without aspiration. Otherwise, normal - Asthma exac 12/17/17- FENO 18. Significant upper airway wheeze, tx pred taper. Add neurontin 100mg  TID.  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 > rec max  gerd rx   - 04/05/2018 rechallenge with gabapentin 300  Qid  - PFT's  04/18/2018  FEV1 1.13 (68 % ) ratio 91  p 7 % improvement from saba p nothing prior to study with DLCO  53/60 % corrects to 147  % for alv volume  s curvature - 02/04/2019 informed only using gabapentin 300 mg hs so ok to d/c    No flares using nebs as maint and continuing gerd rx/ reviewed         Each maintenance medication was reviewed in detail including emphasizing most importantly the difference between maintenance and prns and under what circumstances the prns are to be triggered using an action plan format where appropriate.  Total time for H and P, chart review, counseling, reviewing hfa/neb device(s) and generating customized AVS unique to this office visit / same day charting =21 min

## 2021-03-17 NOTE — Assessment & Plan Note (Signed)
Onset of symptoms reported  2012 p quit smoking  2005  - FEN0 01/24/2016 = 13 with active symptoms on symb 160 2bid > changed to dulera 100  - Spirometry 02/23/2016  NO signifiant obstruction with active symptoms  - 02/23/2016  After extensive coaching HFA effectiveness =    75%  - FENO 02/23/2016  =  12   On dulera 100 2bid (unable to confirm adeherence as did not bring it - Singulair 10 mg daily added 02/23/2016 to dulera 100 2bid with 2 week sample only  (unable to verify she used it) - Allergy profile 02/23/16  >  Eos 0.0 /  IgE  87 pos  RAST  Grass/ trees  - 04/07/2016   symb 80 x one sample given - FENO 04/07/2016  =   43 ? Really on meds ?  - 05/08/2016  After extensive coaching HFA effectiveness =    75% > continue symb 80 2bid - 07/17/2016 added singulair maint   - FENO 09/18/2016  =   15 on symb 160 2bid > try 80 2bid  - flare late feb 2019 ? What maint rx she was taking but on admit "non-adherent per notes 08/12/17  - 09/18/2017  After extensive coaching inhaler device  effectiveness =    75% from a baseline of 50% > restart symb 80 2bid - FENO 09/18/2017  =   20 on symb 160  - Spirometry 09/18/2017  No obstruction at all with active wheeze -  09/09/2019 added prednisone as "plan D"  - 09/08/2020  After extensive coaching inhaler device,  effectiveness =    50% from baseline of 25% (delayed breath in)   All goals of chronic asthma control met including optimal function and elimination of symptoms with minimal need for rescue therapy.  Contingencies discussed in full including contacting this office immediately if not controlling the symptoms using the rule of two's.     F/u can be yearly now

## 2021-03-21 ENCOUNTER — Telehealth: Payer: Self-pay | Admitting: Cardiology

## 2021-03-21 MED ORDER — CHLORTHALIDONE 25 MG PO TABS: 25.0000 mg | ORAL_TABLET | Freq: Every day | ORAL | 1 refills | Status: DC

## 2021-03-21 NOTE — Telephone Encounter (Signed)
Pt's medication was sent to pt's pharmacy as requested. Confirmation received.  °

## 2021-03-21 NOTE — Telephone Encounter (Signed)
*  STAT* If patient is at the pharmacy, call can be transferred to refill team.   1. Which medications need to be refilled? (please list name of each medication and dose if known) chlorthalidone (HYGROTON) 25 MG tablet  2. Which pharmacy/location (including street and city if local pharmacy) is medication to be sent to? [facility] ([place], [place]  3. Do they need a 30 day or 90 day supply? [place]

## 2021-03-22 ENCOUNTER — Telehealth: Payer: Self-pay | Admitting: Cardiology

## 2021-03-22 MED ORDER — DILTIAZEM HCL ER COATED BEADS 240 MG PO CP24: 240.0000 mg | ORAL_CAPSULE | Freq: Every day | ORAL | 1 refills | Status: DC

## 2021-03-22 NOTE — Telephone Encounter (Signed)
*  STAT* If patient is at the pharmacy, call can be transferred to refill team.   1. Which medications need to be refilled? (please list name of each medication and dose if known)  diltiazem (CARDIZEM CD) 240 MG 24 hr capsule  2. Which pharmacy/location (including street and city if local pharmacy) is medication to be sent to? [place] ([place]), [place] - [address]  3. Do they need a 30 day or 90 day supply?  90 day supply if possible

## 2021-03-22 NOTE — Telephone Encounter (Signed)
Pt's medication was sent to pt's pharmacy as requested. Confirmation received.  °

## 2021-03-23 ENCOUNTER — Telehealth: Payer: Self-pay | Admitting: Internal Medicine

## 2021-03-23 DIAGNOSIS — J4530 Mild persistent asthma, uncomplicated: Secondary | ICD-10-CM

## 2021-03-23 MED ORDER — BUDESONIDE 0.5 MG/2ML IN SUSP: RESPIRATORY_TRACT | 5 refills | Status: DC

## 2021-03-23 NOTE — Telephone Encounter (Signed)
LMTCB  MW please advise if we can send in new order for nebulizer machine. Thanks :)

## 2021-03-23 NOTE — Telephone Encounter (Signed)
New order placed for nebulizer machine.   LM informing patient order has been sent for nebulizer machine refills of pulmicort has been [facility].   Nothing further needed at this time.

## 2021-03-23 NOTE — Telephone Encounter (Signed)
Pt states that she needs a new nebulizer machine as hers has worn out. Wonders if MW prescribes for that or if she just needs to buy one. Isnt sure how this works. Also has quetions about pulmicort. Changed from [place] to [facility] delivery. Does pt need prescription again? Please advise.

## 2021-03-23 NOTE — Telephone Encounter (Signed)
Ok for new neb and refill the pulmocort if needed, same rx

## 2021-03-28 ENCOUNTER — Other Ambulatory Visit: Payer: Self-pay | Admitting: *Deleted

## 2021-03-28 ENCOUNTER — Telehealth: Payer: Self-pay | Admitting: Internal Medicine

## 2021-03-28 DIAGNOSIS — J410 Simple chronic bronchitis: Secondary | ICD-10-CM

## 2021-03-28 MED ORDER — DILTIAZEM HCL ER COATED BEADS 240 MG PO CP24: 240.0000 mg | ORAL_CAPSULE | Freq: Every day | ORAL | 0 refills | Status: DC

## 2021-03-28 NOTE — Telephone Encounter (Signed)
Pt states the wrong rx was sent to [facility]- states that the Budesonide needs to stay at [facility] off [address]. The performist needs to be sent to [facility] not [facility]. Please advise [phone] Pt needs a new nebulizer machine-not sure what DME she uses- looks like order has been sent to [place]- no response

## 2021-03-28 NOTE — Telephone Encounter (Signed)
Left message for her to call back in the morning.

## 2021-03-28 NOTE — Telephone Encounter (Signed)
Left message for patient to call back in the morning.  

## 2021-03-29 ENCOUNTER — Other Ambulatory Visit: Payer: Self-pay | Admitting: Internal Medicine

## 2021-03-29 ENCOUNTER — Other Ambulatory Visit: Payer: Self-pay

## 2021-03-29 ENCOUNTER — Ambulatory Visit: Payer: Medicare Other | Admitting: Podiatry

## 2021-03-29 ENCOUNTER — Encounter: Payer: Self-pay | Admitting: Podiatry

## 2021-03-29 DIAGNOSIS — B351 Tinea unguium: Secondary | ICD-10-CM | POA: Diagnosis not present

## 2021-03-29 DIAGNOSIS — M79675 Pain in left toe(s): Secondary | ICD-10-CM

## 2021-03-29 DIAGNOSIS — M79674 Pain in right toe(s): Secondary | ICD-10-CM | POA: Diagnosis not present

## 2021-03-29 DIAGNOSIS — R1310 Dysphagia, unspecified: Secondary | ICD-10-CM | POA: Insufficient documentation

## 2021-03-29 DIAGNOSIS — K529 Noninfective gastroenteritis and colitis, unspecified: Secondary | ICD-10-CM | POA: Insufficient documentation

## 2021-03-29 DIAGNOSIS — I214 Non-ST elevation (NSTEMI) myocardial infarction: Secondary | ICD-10-CM | POA: Insufficient documentation

## 2021-03-29 DIAGNOSIS — E119 Type 2 diabetes mellitus without complications: Secondary | ICD-10-CM

## 2021-03-29 DIAGNOSIS — R400 Somnolence: Secondary | ICD-10-CM | POA: Insufficient documentation

## 2021-03-29 MED ORDER — FORMOTEROL FUMARATE 20 MCG/2ML IN NEBU
INHALATION_SOLUTION | RESPIRATORY_TRACT | 11 refills | Status: DC
Start: 2021-03-29 — End: 2022-05-23

## 2021-03-29 MED ORDER — BUDESONIDE 0.5 MG/2ML IN SUSP: RESPIRATORY_TRACT | 6 refills | Status: DC

## 2021-03-29 NOTE — Telephone Encounter (Signed)
I have talked to the patient and she advised me on how to send in the prescription to the pharmacies she wanted them to go to and they have been sent,  I have placed a new order for the nebulizer machine.  Patient was appreciated of the call. Nothing further needed.

## 2021-03-29 NOTE — Telephone Encounter (Signed)
Lmtcb for pt to make sure ok to send script to [facility].

## 2021-03-30 ENCOUNTER — Encounter: Payer: Self-pay | Admitting: Podiatry

## 2021-03-30 ENCOUNTER — Ambulatory Visit: Payer: Medicare Other | Attending: Gastroenterology | Admitting: Physical Therapy

## 2021-03-30 ENCOUNTER — Encounter: Payer: Self-pay | Admitting: Physical Therapy

## 2021-03-30 DIAGNOSIS — M6281 Muscle weakness (generalized): Secondary | ICD-10-CM | POA: Insufficient documentation

## 2021-03-30 DIAGNOSIS — J4541 Moderate persistent asthma with (acute) exacerbation: Secondary | ICD-10-CM | POA: Diagnosis not present

## 2021-03-30 DIAGNOSIS — R293 Abnormal posture: Secondary | ICD-10-CM | POA: Insufficient documentation

## 2021-03-30 NOTE — Therapy (Signed)
[facility] @ [address] [place], [place], [address] Phone: [phone]   Fax:  [phone]  Physical Therapy Evaluation  Patient Details  Name: [name] MRN: [id number] Date of Birth: [birth year] Referring Provider (PT): Garnette Scheuermann, [place]   Encounter Date: 03/30/2021   PT End of Session - 03/30/21 1637     Visit Number 1    Date for PT Re-Evaluation 06/22/21    Authorization Type UHC    PT Start Time 1401    PT Stop Time 8768    PT Time Calculation (min) 42 min    Activity Tolerance Patient tolerated treatment well    Behavior During Therapy [facility] for tasks assessed/performed             Past Medical History:  Diagnosis Date   Anxiety    Arthritis    Asthma    Chronic diastolic CHF (congestive heart failure) ([place]) 07/17/2016   Coronary artery disease    a. NSTEMI 12/2015 - LHC 01/18/16: s/p overlapping DESx2 to RCA, 10% ost-prox Cx,10% mLAD.   Dementia ([place])    Depression    Diabetes mellitus    Dyslipidemia    GERD (gastroesophageal reflux disease)    History of seizure    Hypertension    Hypertensive heart disease    NSTEMI (non-ST elevated myocardial infarction) ([place]) 12/2015   Stage 3 chronic kidney disease (HCC)     Past Surgical History:  Procedure Laterality Date   ABDOMINAL HYSTERECTOMY     CARDIAC CATHETERIZATION  1994   minimal LAD dz, no other dz, EF normal   CARDIAC CATHETERIZATION N/A 01/18/2016   Procedure: Left Heart Cath and Coronary Angiography;  Surgeon: Burnell Blanks, MD;  Location: [place] [facility];  Service: Cardiovascular;  Laterality: N/A;   CARDIAC CATHETERIZATION N/A 01/18/2016   Procedure: Coronary Stent Intervention;  Surgeon: Burnell Blanks, MD;  Location: [place] [facility];  Service: Cardiovascular;  Laterality: N/A;   COLONOSCOPY WITH PROPOFOL N/A 04/09/2014   Procedure: COLONOSCOPY WITH PROPOFOL;  Surgeon: Cleotis Nipper, MD;  [facility]: [facility];  Service: Endoscopy;  Laterality: N/A;   CORONARY STENT PLACEMENT  01/19/2016   STENT SYNERGY DES [id number] drug eluting stent was successfully placed, and overlaps the 2.5 x 38 mm Synergy stent placed distally.   ESOPHAGOGASTRODUODENOSCOPY (EGD) WITH PROPOFOL N/A 04/09/2014   Procedure: ESOPHAGOGASTRODUODENOSCOPY (EGD) WITH PROPOFOL;  Surgeon: Cleotis Nipper, MD;  [facility];  Service: Endoscopy;  Laterality: N/A;   FOOT SURGERY Bilateral    approx ~2000   HEMORRHOID SURGERY     HERNIA REPAIR     KNEE ARTHROSCOPY     RADIOACTIVE SEED GUIDED EXCISIONAL BREAST BIOPSY Left 01/20/2021   Procedure: RADIOACTIVE SEED GUIDED EXCISIONAL LEFT BREAST BIOPSY;  Surgeon: Stark Klein, MD;  Location: [facility];  Service: General;  Laterality: Left;    There were no vitals filed for this visit.    Subjective Assessment - 03/30/21 1407     Subjective Pt is having constipation and diarrhea.  States she used to go 3-4 BM per week until about 7 months ago.  I always used mirilax 2-3/week.  After that didn't work I used an enema and then went to gastro and started having diarrhea. Somtimes maybe 1/month I might have diarrhea in the middle of the night and don't even    Patient Stated Goals stop having the leakage    Currently in Pain? No/denies  [facility] PT Assessment - 03/30/21 0001       Assessment   Medical Diagnosis R15.9 (ICD-10-CM) - Full incontinence of feces    Referring Provider (PT) McMichael, Bayley M, PA-C      Precautions   Precautions None      Balance Screen   Has the patient fallen in the past 6 months Yes    How many times? 1   I was in the tub and forgot I wasn't in the shower when I stepped out so I tripped   Has the patient had a decrease in activity level because of a fear of falling?  No    Is the patient reluctant to leave their home because of a fear of falling?  No      Home Ecologist residence    Living  Arrangements Spouse/significant other;Other relatives   granddaughter and 2 kids     Prior Function   Level of Independence Independent    Vocation Retired    Leisure walking daily, housework, traveling      Cognition   Overall Cognitive Status Within Functional Limits for tasks assessed      Posture/Postural Control   Posture/Postural Control Postural limitations    Postural Limitations Anterior pelvic tilt;Increased lumbar lordosis      ROM / Strength   AROM / PROM / Strength PROM;Strength;AROM      AROM   Overall AROM Comments lumbar flexion normal      PROM   Overall PROM Comments hip flexion and ER 75%      Strength   Overall Strength Comments 5/5 bil hip      Flexibility   Soft Tissue Assessment /Muscle Length yes    Hamstrings normal      Palpation   Palpation comment distended abdomen; elastic on pants were tight and restricted      Ambulation/Gait   Gait Pattern Within Functional Limits                        Objective measurements completed on examination: See above findings.     Pelvic Floor Special Questions - 03/30/21 0001     Prior Pregnancies Yes    Number of Pregnancies 3    Number of Vaginal Deliveries 3    Urinary Leakage No    Urinary urgency No    Urinary frequency normal    Fecal incontinence Yes   feeling like I have to pass gas   Pelvic Floor Internal Exam pt identity confimred and informed consent given to perform internal assessment    Exam Type Rectal    Palpation tight rectal sphincters    Strength fair squeeze, definite lift    Strength # of reps 5    Strength # of seconds 7    Tone high                       PT Education - 03/30/21 1710     Education Details abdominal massage    Person(s) Educated Patient    Methods Explanation;Verbal cues;Handout    Comprehension Verbalized understanding;Returned demonstration              PT Short Term Goals - 03/30/21 1713       PT SHORT TERM GOAL  #1   Title ind with ab massage    Time 4    Period Weeks    Status New  Target Date 04/27/21               PT Long Term Goals - 03/30/21 1713       PT LONG TERM GOAL #1   Title Pt reports BMs occurring 3-4/week without straining    Time 12    Period Weeks    Status New    Target Date 06/22/21      PT LONG TERM GOAL #2   Title Pt reports more solid stool due to improvd pelvic floor muscle tone    Time 12    Period Weeks    Status New    Target Date 06/22/21      PT LONG TERM GOAL #3   Title Pt will be ind with advanced HEP for pelvic strength    Time 12    Period Weeks    Status New    Target Date 06/22/21      PT LONG TERM GOAL #4   Title Pt will report 50% less leakage    Time 12    Period Weeks    Status New    Target Date 06/22/21                    Plan - 03/30/21 1418     Clinical Impression Statement Pt present to clinic due to fecal incontinence.  Pt has moderate strength of 3/5 but lowered endurance of the pelvic floor muscles. Pt only holds for 7 sec for one rep.  Pt has posutre abnormalities as mentioned above and tight muscle thoruhgout back and hamstrings.  She has limited hip ER.  Pt has abdominal distension. She will benefit from skiled PT to address impairments and improve quality of life    Personal Factors and Comorbidities Comorbidity 2    Comorbidities lumpectomy, 3 vaginal deliveries    Examination-Activity Limitations Toileting;Continence    Examination-Participation Restrictions Community Activity    Stability/Clinical Decision Making Evolving/Moderate complexity    Clinical Decision Making Moderate    Rehab Potential Excellent    PT Frequency 1x / week    PT Duration 12 weeks    PT Treatment/Interventions ADLs/Self Care Home Management;Biofeedback;Cryotherapy;Electrical Stimulation;Neuromuscular re-education;Therapeutic exercise;Therapeutic activities;Patient/family education;Manual techniques;Passive range of motion;Dry  needling;Taping;Moist Heat    PT Next Visit Plan abdominal and lumbar massage, lumbar stretches and bulging pelvic floor, toileting techniques    Consulted and Agree with Plan of Care Patient             Patient will benefit from skilled therapeutic intervention in order to improve the following deficits and impairments:  Pain, Increased fascial restricitons, Decreased strength, Decreased endurance  Visit Diagnosis: Muscle weakness (generalized)  Abnormal posture     Problem List Patient Active Problem List   Diagnosis Date Noted   Abnormal swallowing 03/29/2021   Acute non-ST segment elevation myocardial infarction ([place]) 03/29/2021   Chronic diarrhea 03/29/2021   Daytime somnolence 03/29/2021   Abnormal mammogram of left breast 12/13/2020   Acute respiratory disease due to COVID-19 virus 06/16/2020   Wheezing 09/13/2019   Memory loss 03/20/2019   Severe persistent asthma without complication 85/88/5027   DM2 (diabetes mellitus, type 2) ([place]) 03/15/2018   CKD (chronic kidney disease), stage III ([place]) 03/15/2018   Dyspnea 08/12/2017   Macrocytic anemia 08/12/2017   Therapeutic drug monitoring 05/17/2017   Lung nodule 08/01/2016   Influenza B 07/18/2016   Chronic diastolic CHF (congestive heart failure) ([place]) 07/17/2016   Acute renal failure superimposed on stage 3 chronic  kidney disease ([place]) 07/17/2016   Right leg swelling 02/27/2016   Acute respiratory failure with hypoxia (HCC)    Uncontrolled type 2 diabetes mellitus with complication    Acute pulmonary embolism (HCC)    HLD (hyperlipidemia)    Seizures ([place]) 01/27/2016   Numbness 01/27/2016   CAD in native artery 01/20/2016   Hypertensive heart disease 01/20/2016   NSTEMI (non-ST elevated myocardial infarction) ([place]) 01/18/2016   Acute bronchitis with asthma with acute exacerbation 01/16/2016   Hypokalemia 01/16/2016   Hypomagnesemia 01/16/2016   Acute respiratory failure ([place]) 01/16/2016   Dyspnea on  exertion    Asthma exacerbation 07/21/2015   Upper airway cough syndrome with VCD 08/28/2014   Diabetes mellitus type 2, controlled ([place]) 08/06/2014   Hyperlipidemia 08/06/2014   Allergic rhinitis 07/22/2008   OTHER DISEASES OF VOCAL CORDS 07/22/2008   DYSPNEA 07/22/2008   Diabetes mellitus type 2, uncontrolled 07/21/2008   DIABETIC PERIPHERAL NEUROPATHY 07/21/2008   Depression with anxiety 07/21/2008   Essential hypertension 07/21/2008   Mild persistent chronic asthma without complication 72/53/6644   G E R D 07/21/2008   PULMONARY EMBOLISM, HX OF 07/21/2008    Jule Ser, PT 03/30/2021, 5:16 PM  [facility] @ [address] [place], [place], [address] Phone: [phone]   Fax:  [phone]  Name: [name] MRN: [id number] Date of Birth: 1951-03-14

## 2021-03-30 NOTE — Progress Notes (Signed)
ANNUAL DIABETIC FOOT EXAM  Subjective: [name] presents today for for annual diabetic foot examination.  Patient relates greater than 10 year h/o diabetes.  Patient denies any h/o foot wounds.  Patient denies any numbness, tingling, burning, or pins/needle sensation in feet.  Patient does not monitor blood glucose daily.  [name], MD is patient's PCP. Last visit was 03/28/2021.  Past Medical History:  Diagnosis Date   Anxiety    Arthritis    Asthma    Chronic diastolic CHF (congestive heart failure) ([place]) 07/17/2016   Coronary artery disease    a. NSTEMI 12/2015 - LHC 01/18/16: s/p overlapping DESx2 to RCA, 10% ost-prox Cx,10% mLAD.   Dementia ([place])    Depression    Diabetes mellitus    Dyslipidemia    GERD (gastroesophageal reflux disease)    History of seizure    Hypertension    Hypertensive heart disease    NSTEMI (non-ST elevated myocardial infarction) ([place]) 12/2015   Stage 3 chronic kidney disease ([place])    Patient Active Problem List   Diagnosis Date Noted   Abnormal swallowing 03/29/2021   Acute non-ST segment elevation myocardial infarction ([place]) 03/29/2021   Chronic diarrhea 03/29/2021   Daytime somnolence 03/29/2021   Abnormal mammogram of left breast 12/13/2020   Acute respiratory disease due to COVID-19 virus 06/16/2020   Wheezing 09/13/2019   Memory loss 03/20/2019   Severe persistent asthma without complication 10/25/9483   DM2 (diabetes mellitus, type 2) ([place]) 03/15/2018   CKD (chronic kidney disease), stage III ([place]) 03/15/2018   Dyspnea 08/12/2017   Macrocytic anemia 08/12/2017   Therapeutic drug monitoring 05/17/2017   Lung nodule 08/01/2016   Influenza B 07/18/2016   Chronic diastolic CHF (congestive heart failure) ([place]) 07/17/2016   Acute renal failure superimposed on stage 3 chronic kidney disease ([place]) 07/17/2016   Right leg swelling 02/27/2016   Acute respiratory failure with hypoxia (HCC)    Uncontrolled type 2 diabetes  mellitus with complication    Acute pulmonary embolism (HCC)    HLD (hyperlipidemia)    Seizures ([place]) 01/27/2016   Numbness 01/27/2016   CAD in native artery 01/20/2016   Hypertensive heart disease 01/20/2016   NSTEMI (non-ST elevated myocardial infarction) ([place]) 01/18/2016   Acute bronchitis with asthma with acute exacerbation 01/16/2016   Hypokalemia 01/16/2016   Hypomagnesemia 01/16/2016   Acute respiratory failure ([place]) 01/16/2016   Dyspnea on exertion    Asthma exacerbation 07/21/2015   Upper airway cough syndrome with VCD 08/28/2014   Diabetes mellitus type 2, controlled ([place]) 08/06/2014   Hyperlipidemia 08/06/2014   Allergic rhinitis 07/22/2008   OTHER DISEASES OF VOCAL CORDS 07/22/2008   DYSPNEA 07/22/2008   Diabetes mellitus type 2, uncontrolled 07/21/2008   DIABETIC PERIPHERAL NEUROPATHY 07/21/2008   Depression with anxiety 07/21/2008   Essential hypertension 07/21/2008   Mild persistent chronic asthma without complication 46/27/0350   G E R D 07/21/2008   PULMONARY EMBOLISM, HX OF 07/21/2008   Past Surgical History:  Procedure Laterality Date   ABDOMINAL HYSTERECTOMY     CARDIAC CATHETERIZATION  1994   minimal LAD dz, no other dz, EF normal   CARDIAC CATHETERIZATION N/A 01/18/2016   Procedure: Left Heart Cath and Coronary Angiography;  Surgeon: Burnell Blanks, MD;  Location: [facility];  Service: Cardiovascular;  Laterality: N/A;   CARDIAC CATHETERIZATION N/A 01/18/2016   Procedure: Coronary Stent Intervention;  Surgeon: Burnell Blanks, MD;  Location: [facility];  Service: Cardiovascular;  Laterality: N/A;  COLONOSCOPY WITH PROPOFOL N/A 04/09/2014   Procedure: COLONOSCOPY WITH PROPOFOL;  Surgeon: Cleotis Nipper, MD;  [facility];  Service: Endoscopy;  Laterality: N/A;   CORONARY STENT PLACEMENT  01/19/2016   STENT SYNERGY DES 7.56E33 drug eluting stent was successfully placed, and overlaps the 2.5 x 38 mm Synergy stent  placed distally.   ESOPHAGOGASTRODUODENOSCOPY (EGD) WITH PROPOFOL N/A 04/09/2014   Procedure: ESOPHAGOGASTRODUODENOSCOPY (EGD) WITH PROPOFOL;  Surgeon: Cleotis Nipper, MD;  [facility];  Service: Endoscopy;  Laterality: N/A;   FOOT SURGERY Bilateral    approx ~2000   HEMORRHOID SURGERY     HERNIA REPAIR     KNEE ARTHROSCOPY     RADIOACTIVE SEED GUIDED EXCISIONAL BREAST BIOPSY Left 01/20/2021   Procedure: RADIOACTIVE SEED GUIDED EXCISIONAL LEFT BREAST BIOPSY;  Surgeon: Stark Klein, MD;  Location: [facility];  Service: General;  Laterality: Left;   Current Outpatient Medications on File Prior to Visit  Medication Sig Dispense Refill   albuterol (PROAIR HFA) 108 (90 Base) MCG/ACT inhaler Inhale 2 puffs into the lungs every 4 (four) hours as needed for wheezing or shortness of breath. 18 g 5   albuterol (PROVENTIL) (2.5 MG/3ML) 0.083% nebulizer solution Take 2.5 mg by nebulization every 4 (four) hours as needed for wheezing or shortness of breath.     ALPRAZolam (XANAX) 0.5 MG tablet Take 1 tablet (0.5 mg total) by mouth 2 (two) times daily as needed for anxiety. 30 tablet 0   atorvastatin (LIPITOR) 40 MG tablet Take 40 mg by mouth at bedtime.     Biotin 5000 MCG CAPS Take 5,000 mcg by mouth daily.     budesonide (PULMICORT) 0.5 MG/2ML nebulizer solution USE 1 VIAL IN NEBULIZER IN THE MORNING AND AT BEDTIME 120 mL 6   chlorthalidone (HYGROTON) 25 MG tablet Take 1 tablet (25 mg total) by mouth daily. 90 tablet 1   diltiazem (CARDIZEM CD) 240 MG 24 hr capsule Take 1 capsule (240 mg total) by mouth daily. 90 capsule 0   divalproex (DEPAKOTE ER) 500 MG 24 hr tablet Take 1 tablet (500 mg total) by mouth at bedtime. 90 tablet 1   esomeprazole (NEXIUM) 40 MG capsule Take 40 mg by mouth 2 (two) times daily before a meal.     fluticasone (FLONASE) 50 MCG/ACT nasal spray Place 2 sprays into both nostrils daily as needed for allergies.     formoterol (PERFOROMIST) 20 MCG/2ML nebulizer solution USE  1 VIAL  IN  NEBULIZER TWICE  DAILY - morning and evening 120 mL 11   hydrALAZINE (APRESOLINE) 50 MG tablet Take 1 tablet (50 mg total) by mouth 2 (two) times daily. 270 tablet 3   latanoprost (XALATAN) 0.005 % ophthalmic solution Place 1 drop into both eyes at bedtime.     memantine (NAMENDA) 10 MG tablet Take 1 tablet (10 mg total) by mouth 2 (two) times daily. 180 tablet 1   metFORMIN (GLUCOPHAGE) 500 MG tablet Take 500 mg by mouth 2 (two) times daily with a meal.     montelukast (SINGULAIR) 10 MG tablet Take 1 tablet (10 mg total) by mouth at bedtime. 30 tablet 5   Multiple Vitamin (MULTIVITAMIN WITH MINERALS) TABS tablet Take 1 tablet by mouth daily.     nebivolol (BYSTOLIC) 10 MG tablet Take 1 tablet (10 mg total) by mouth daily. 34 tablet 11   nitroGLYCERIN (NITROSTAT) 0.4 MG SL tablet DISSOLVE ONE TABLET UNDER THE TONGUE EVERY 5 MINUTES AS NEEDED FOR CHEST PAIN.  DO NOT  EXCEED A TOTAL OF 3 DOSES IN 15 MINUTES 25 tablet 0   potassium chloride SA (K-DUR,KLOR-CON) 20 MEQ tablet Take 20 mEq by mouth daily.      Probiotic Product (EQ PROBIOTIC) CAPS Take 1 capsule by mouth daily.     No current facility-administered medications on file prior to visit.    Allergies  Allergen Reactions   Tramadol Other (See Comments)    Seizure d/o   Social History   Occupational History   Occupation: Nutritional therapist  Tobacco Use   Smoking status: Former    Packs/day: 1.00    Years: 30.00    Pack years: 30.00    Types: Cigarettes    Quit date: 01/09/2004    Years since quitting: 17.2   Smokeless tobacco: Never  Vaping Use   Vaping Use: Never used  Substance and Sexual Activity   Alcohol use: No    Alcohol/week: 0.0 standard drinks   Drug use: No   Sexual activity: Not on file   Family History  Problem Relation Age of Onset   Allergies Mother    Asthma Mother    CAD Father 22   Diabetes Father    Hypertension Father    Congestive Heart Failure Sister        died in her 69s   CAD Sister 65    Immunization History  Administered Date(s) Administered   Fluad Quad(high Dose 65+) 02/06/2020, 01/31/2021   Influenza Split 03/10/2012, 03/14/2015   Influenza, High Dose Seasonal PF 02/04/2017, 01/04/2018, 12/31/2018   Influenza-Unspecified 01/11/2016, 01/07/2019   PFIZER Comirnaty(Gray Top)Covid-19 Tri-Sucrose Vaccine 01/31/2021   PFIZER(Purple Top)SARS-COV-2 Vaccination 05/16/2019, 06/16/2019, 02/06/2020, 05/15/2020, 11/12/2020   Pneumococcal Conjugate-13 11/19/2014   Pneumococcal Polysaccharide-23 02/04/2002, 01/11/2011, 10/13/2020   Tdap 01/11/2011   Zoster Recombinat (Shingrix) 06/11/2017, 08/28/2017, 09/27/2017   Zoster, Live 07/10/2012     Review of Systems: Negative except as noted in the HPI.   Objective: There were no vitals filed for this visit.  ALINA GILKEY is a pleasant 70 y.o. female in NAD. AAO X 3.  Vascular Examination: CFT immediate b/l LE. Palpable DP/PT pulses b/l LE. Digital hair present b/l. Skin temperature gradient WNL b/l. No pain with calf compression b/l. No edema noted b/l. No cyanosis or clubbing noted b/l LE.  Dermatological Examination: Pedal integument with normal turgor, texture and tone b/l LE. No open wounds b/l. No interdigital macerations b/l. Toenails 1-5 b/l elongated, thickened, discolored with subungual debris. +Tenderness with dorsal palpation of nailplates. No hyperkeratotic or porokeratotic lesions present.  Musculoskeletal Examination: Muscle strength 5/5 to all lower extremity muscle groups bilaterally. No pain, crepitus or joint limitation noted with ROM bilateral LE. No gross bony deformities bilaterally. Patient ambulates independent of any assistive aids.  Footwear Assessment: Does the patient wear appropriate shoes? Yes. Does the patient need inserts/orthotics? No.  Neurological Examination: Protective sensation intact 5/5 intact bilaterally with 10g monofilament b/l. Vibratory sensation intact b/l.  Hemoglobin A1C  Latest Ref Rng & Units 01/14/2021 06/16/2020  HGBA1C 4.8 - 5.6 % 6.0(H) 6.4(H)  Some recent data might be hidden   Assessment: 1. Pain due to onychomycosis of toenails of both feet   2. Controlled type 2 diabetes mellitus without complication, without long-term current use of insulin ([place])   3. Encounter for diabetic foot exam ([place])      ADA Risk Categorization: Low Risk :  Patient has all of the following: Intact protective sensation No prior foot ulcer  No severe deformity Pedal pulses present  Plan: -Diabetic foot examination performed today. -Continue foot and shoe inspections daily. Monitor blood glucose per PCP/Endocrinologist's recommendations. -Mycotic toenails 1-5 bilaterally were debrided in length and girth with sterile nail nippers and dremel without incident. -Patient/POA to call should there be question/concern in the interim.  Return in about 3 months (around 06/28/2021).  Marzetta Board, DPM

## 2021-03-30 NOTE — Patient Instructions (Signed)
About Abdominal Massage  Abdominal massage, also called external colon massage, is a self-treatment circular massage technique that can reduce and eliminate gas and ease constipation. The colon naturally contracts in waves in a clockwise direction starting from inside the right hip, moving up toward the ribs, across the belly, and down inside the left hip.  When you perform circular abdominal massage, you help stimulate your colon's normal wave pattern of movement called peristalsis.  It is most beneficial when done after eating.  Positioning You can practice abdominal massage with oil while lying down, or in the shower with soap.  Some people find that it is just as effective to do the massage through clothing while sitting or standing.  How to Massage Start by placing your finger tips or knuckles on your right side, just inside your hip bone.  . Make small circular movements while you move upward toward your rib cage.   . Once you reach the bottom right side of your rib cage, take your circular movements across to the left side of the bottom of your rib cage.  . Next, move downward until you reach the inside of your left hip bone.  This is the path your feces travel in your colon. . Continue to perform your abdominal massage in this pattern for 10 minutes each day.     You can apply as much pressure as is comfortable in your massage.  Start gently and build pressure as you continue to practice.  Notice any areas of pain as you massage; areas of slight pain may be relieved as you massage, but if you have areas of significant or intense pain, consult with your healthcare provider.  Other Considerations . General physical activity including bending and stretching can have a beneficial massage-like effect on the colon.  Deep breathing can also stimulate the colon because breathing deeply activates the same nervous system that supplies the colon.   . Abdominal massage should always be used in  combination with a bowel-conscious diet that is high in the proper type of fiber for you, fluids (primarily water), and a regular exercise program.  

## 2021-03-31 ENCOUNTER — Telehealth: Payer: Self-pay | Admitting: Internal Medicine

## 2021-03-31 NOTE — Telephone Encounter (Signed)
Lmtcb for pt.  

## 2021-04-01 ENCOUNTER — Emergency Department (HOSPITAL_COMMUNITY): Payer: Medicare Other

## 2021-04-01 ENCOUNTER — Encounter (HOSPITAL_COMMUNITY): Payer: Self-pay

## 2021-04-01 ENCOUNTER — Inpatient Hospital Stay (HOSPITAL_COMMUNITY)
Admission: EM | Admit: 2021-04-01 | Discharge: 2021-04-10 | DRG: 202 | Disposition: A | Discharge status: Home Health | Payer: Medicare Other | Attending: Internal Medicine | Admitting: Internal Medicine

## 2021-04-01 ENCOUNTER — Other Ambulatory Visit: Payer: Self-pay

## 2021-04-01 DIAGNOSIS — Z955 Presence of coronary angioplasty implant and graft: Secondary | ICD-10-CM

## 2021-04-01 DIAGNOSIS — E1165 Type 2 diabetes mellitus with hyperglycemia: Secondary | ICD-10-CM | POA: Diagnosis not present

## 2021-04-01 DIAGNOSIS — I5032 Chronic diastolic (congestive) heart failure: Secondary | ICD-10-CM

## 2021-04-01 DIAGNOSIS — J310 Chronic rhinitis: Secondary | ICD-10-CM | POA: Diagnosis present

## 2021-04-01 DIAGNOSIS — F418 Other specified anxiety disorders: Secondary | ICD-10-CM

## 2021-04-01 DIAGNOSIS — Z7952 Long term (current) use of systemic steroids: Secondary | ICD-10-CM

## 2021-04-01 DIAGNOSIS — Z7984 Long term (current) use of oral hypoglycemic drugs: Secondary | ICD-10-CM

## 2021-04-01 DIAGNOSIS — E1142 Type 2 diabetes mellitus with diabetic polyneuropathy: Secondary | ICD-10-CM | POA: Diagnosis present

## 2021-04-01 DIAGNOSIS — B9789 Other viral agents as the cause of diseases classified elsewhere: Secondary | ICD-10-CM | POA: Diagnosis present

## 2021-04-01 DIAGNOSIS — E1122 Type 2 diabetes mellitus with diabetic chronic kidney disease: Secondary | ICD-10-CM | POA: Diagnosis present

## 2021-04-01 DIAGNOSIS — J45901 Unspecified asthma with (acute) exacerbation: Secondary | ICD-10-CM

## 2021-04-01 DIAGNOSIS — Z825 Family history of asthma and other chronic lower respiratory diseases: Secondary | ICD-10-CM

## 2021-04-01 DIAGNOSIS — Z6830 Body mass index (BMI) 30.0-30.9, adult: Secondary | ICD-10-CM

## 2021-04-01 DIAGNOSIS — Z79899 Other long term (current) drug therapy: Secondary | ICD-10-CM

## 2021-04-01 DIAGNOSIS — Z8249 Family history of ischemic heart disease and other diseases of the circulatory system: Secondary | ICD-10-CM

## 2021-04-01 DIAGNOSIS — D539 Nutritional anemia, unspecified: Secondary | ICD-10-CM

## 2021-04-01 DIAGNOSIS — J4541 Moderate persistent asthma with (acute) exacerbation: Principal | ICD-10-CM

## 2021-04-01 DIAGNOSIS — I2510 Atherosclerotic heart disease of native coronary artery without angina pectoris: Secondary | ICD-10-CM | POA: Diagnosis not present

## 2021-04-01 DIAGNOSIS — E1169 Type 2 diabetes mellitus with other specified complication: Secondary | ICD-10-CM

## 2021-04-01 DIAGNOSIS — Z90710 Acquired absence of both cervix and uterus: Secondary | ICD-10-CM

## 2021-04-01 DIAGNOSIS — K219 Gastro-esophageal reflux disease without esophagitis: Secondary | ICD-10-CM

## 2021-04-01 DIAGNOSIS — E871 Hypo-osmolality and hyponatremia: Secondary | ICD-10-CM | POA: Diagnosis not present

## 2021-04-01 DIAGNOSIS — E119 Type 2 diabetes mellitus without complications: Secondary | ICD-10-CM

## 2021-04-01 DIAGNOSIS — Z87891 Personal history of nicotine dependence: Secondary | ICD-10-CM

## 2021-04-01 DIAGNOSIS — Z885 Allergy status to narcotic agent status: Secondary | ICD-10-CM

## 2021-04-01 DIAGNOSIS — Z86711 Personal history of pulmonary embolism: Secondary | ICD-10-CM

## 2021-04-01 DIAGNOSIS — N183 Chronic kidney disease, stage 3 unspecified: Secondary | ICD-10-CM | POA: Diagnosis present

## 2021-04-01 DIAGNOSIS — E876 Hypokalemia: Secondary | ICD-10-CM | POA: Diagnosis present

## 2021-04-01 DIAGNOSIS — Z20822 Contact with and (suspected) exposure to covid-19: Secondary | ICD-10-CM | POA: Diagnosis present

## 2021-04-01 DIAGNOSIS — N1831 Chronic kidney disease, stage 3a: Secondary | ICD-10-CM

## 2021-04-01 DIAGNOSIS — Z8616 Personal history of COVID-19: Secondary | ICD-10-CM

## 2021-04-01 DIAGNOSIS — F0394 Unspecified dementia, unspecified severity, with anxiety: Secondary | ICD-10-CM | POA: Diagnosis present

## 2021-04-01 DIAGNOSIS — T380X5A Adverse effect of glucocorticoids and synthetic analogues, initial encounter: Secondary | ICD-10-CM | POA: Diagnosis not present

## 2021-04-01 DIAGNOSIS — I130 Hypertensive heart and chronic kidney disease with heart failure and stage 1 through stage 4 chronic kidney disease, or unspecified chronic kidney disease: Secondary | ICD-10-CM | POA: Diagnosis present

## 2021-04-01 DIAGNOSIS — N179 Acute kidney failure, unspecified: Secondary | ICD-10-CM | POA: Diagnosis present

## 2021-04-01 DIAGNOSIS — G40909 Epilepsy, unspecified, not intractable, without status epilepticus: Secondary | ICD-10-CM

## 2021-04-01 DIAGNOSIS — E785 Hyperlipidemia, unspecified: Secondary | ICD-10-CM

## 2021-04-01 DIAGNOSIS — I252 Old myocardial infarction: Secondary | ICD-10-CM

## 2021-04-01 DIAGNOSIS — E669 Obesity, unspecified: Secondary | ICD-10-CM | POA: Diagnosis present

## 2021-04-01 DIAGNOSIS — M1990 Unspecified osteoarthritis, unspecified site: Secondary | ICD-10-CM | POA: Diagnosis present

## 2021-04-01 DIAGNOSIS — I10 Essential (primary) hypertension: Secondary | ICD-10-CM

## 2021-04-01 DIAGNOSIS — R569 Unspecified convulsions: Secondary | ICD-10-CM

## 2021-04-01 DIAGNOSIS — F0393 Unspecified dementia, unspecified severity, with mood disturbance: Secondary | ICD-10-CM | POA: Diagnosis present

## 2021-04-01 DIAGNOSIS — R0602 Shortness of breath: Secondary | ICD-10-CM

## 2021-04-01 LAB — CBC WITH DIFFERENTIAL/PLATELET
Abs Immature Granulocytes: 0.02 10*3/uL (ref 0.00–0.07)
Basophils Absolute: 0.1 10*3/uL (ref 0.0–0.1)
Basophils Relative: 1 %
Eosinophils Absolute: 0 10*3/uL (ref 0.0–0.5)
Eosinophils Relative: 1 %
HCT: 36.4 % (ref 36.0–46.0)
Hemoglobin: 11.9 g/dL — ABNORMAL LOW (ref 12.0–15.0)
Immature Granulocytes: 0 %
Lymphocytes Relative: 32 %
Lymphs Abs: 2.1 10*3/uL (ref 0.7–4.0)
MCH: 31.2 pg (ref 26.0–34.0)
MCHC: 32.7 g/dL (ref 30.0–36.0)
MCV: 95.5 fL (ref 80.0–100.0)
Monocytes Absolute: 0.9 10*3/uL (ref 0.1–1.0)
Monocytes Relative: 13 %
Neutro Abs: 3.5 10*3/uL (ref 1.7–7.7)
Neutrophils Relative %: 53 %
Platelets: 269 10*3/uL (ref 150–400)
RBC: 3.81 MIL/uL — ABNORMAL LOW (ref 3.87–5.11)
RDW: 13.5 % (ref 11.5–15.5)
WBC: 6.6 10*3/uL (ref 4.0–10.5)
nRBC: 0 % (ref 0.0–0.2)

## 2021-04-01 LAB — BASIC METABOLIC PANEL
Anion gap: 10 (ref 5–15)
BUN: 19 mg/dL (ref 8–23)
CO2: 26 mmol/L (ref 22–32)
Calcium: 9.3 mg/dL (ref 8.9–10.3)
Chloride: 102 mmol/L (ref 98–111)
Creatinine, Ser: 1.45 mg/dL — ABNORMAL HIGH (ref 0.44–1.00)
GFR, Estimated: 38 mL/min — ABNORMAL LOW (ref >60)
Glucose, Bld: 132 mg/dL — ABNORMAL HIGH (ref 70–99)
Potassium: 3.3 mmol/L — ABNORMAL LOW (ref 3.5–5.1)
Sodium: 138 mmol/L (ref 135–145)

## 2021-04-01 LAB — RESP PANEL BY RT-PCR (FLU A&B, COVID) ARPGX2
Influenza A by PCR: NEGATIVE
Influenza B by PCR: NEGATIVE
SARS Coronavirus 2 by RT PCR: NEGATIVE

## 2021-04-01 MED ORDER — GUAIFENESIN-DM 100-10 MG/5ML PO SYRP
10.0000 mL | ORAL_SOLUTION | Freq: Four times a day (QID) | ORAL | Status: DC | PRN
  Administered 2021-04-02 – 2021-04-09 (×9): 10 mL via ORAL
  Filled 2021-04-01 (×9): qty 10

## 2021-04-01 MED ORDER — IPRATROPIUM-ALBUTEROL 0.5-2.5 (3) MG/3ML IN SOLN
3.0000 mL | Freq: Once | RESPIRATORY_TRACT | Status: AC
  Administered 2021-04-01: 3 mL via RESPIRATORY_TRACT
  Filled 2021-04-01: qty 3

## 2021-04-01 MED ORDER — MAGNESIUM SULFATE 2 GM/50ML IV SOLN
2.0000 g | Freq: Once | INTRAVENOUS | Status: AC
  Filled 2021-04-01: qty 50

## 2021-04-01 MED ORDER — KETOROLAC TROMETHAMINE 15 MG/ML IJ SOLN
15.0000 mg | Freq: Once | INTRAMUSCULAR | Status: AC
  Administered 2021-04-01: 15 mg via INTRAVENOUS
  Filled 2021-04-01: qty 1

## 2021-04-01 MED ORDER — ATORVASTATIN CALCIUM 40 MG PO TABS
40.0000 mg | ORAL_TABLET | Freq: Every day | ORAL | Status: DC
  Administered 2021-04-01 – 2021-04-09 (×9): 40 mg via ORAL
  Filled 2021-04-01 (×9): qty 1

## 2021-04-01 MED ORDER — ENOXAPARIN SODIUM 40 MG/0.4ML IJ SOSY
40.0000 mg | PREFILLED_SYRINGE | INTRAMUSCULAR | Status: DC
  Administered 2021-04-02 – 2021-04-10 (×9): 40 mg via SUBCUTANEOUS
  Filled 2021-04-01 (×9): qty 0.4

## 2021-04-01 MED ORDER — ACETAMINOPHEN 650 MG RE SUPP: 650.0000 mg | Freq: Four times a day (QID) | RECTAL | Status: DC | PRN

## 2021-04-01 MED ORDER — IPRATROPIUM-ALBUTEROL 0.5-2.5 (3) MG/3ML IN SOLN
3.0000 mL | Freq: Four times a day (QID) | RESPIRATORY_TRACT | Status: DC
Start: 2021-04-01 — End: 2021-04-02
  Administered 2021-04-01 – 2021-04-02 (×2): 3 mL via RESPIRATORY_TRACT
  Filled 2021-04-01 (×2): qty 3

## 2021-04-01 MED ORDER — ALPRAZOLAM 0.5 MG PO TABS
0.5000 mg | ORAL_TABLET | Freq: Every day | ORAL | Status: DC
  Administered 2021-04-02 – 2021-04-09 (×9): 0.5 mg via ORAL
  Filled 2021-04-01 (×9): qty 1

## 2021-04-01 MED ORDER — NEBIVOLOL HCL 10 MG PO TABS
10.0000 mg | ORAL_TABLET | Freq: Every day | ORAL | Status: DC
  Administered 2021-04-02 – 2021-04-10 (×9): 10 mg via ORAL
  Filled 2021-04-01 (×9): qty 1

## 2021-04-01 MED ORDER — POLYETHYLENE GLYCOL 3350 17 G PO PACK: 17.0000 g | PACK | Freq: Every day | ORAL | Status: DC | PRN

## 2021-04-01 MED ORDER — MAGNESIUM SULFATE 2 GM/50ML IV SOLN
INTRAVENOUS | Status: AC
  Administered 2021-04-02: 2 g via INTRAVENOUS
  Filled 2021-04-01: qty 50

## 2021-04-01 MED ORDER — SODIUM CHLORIDE 0.9% FLUSH
3.0000 mL | Freq: Two times a day (BID) | INTRAVENOUS | Status: DC
  Administered 2021-04-01 – 2021-04-10 (×18): 3 mL via INTRAVENOUS

## 2021-04-01 MED ORDER — PREDNISONE 20 MG PO TABS
60.0000 mg | ORAL_TABLET | Freq: Once | ORAL | Status: AC
  Administered 2021-04-01: 60 mg via ORAL
  Filled 2021-04-01: qty 3

## 2021-04-01 MED ORDER — DILTIAZEM HCL ER COATED BEADS 240 MG PO CP24
240.0000 mg | ORAL_CAPSULE | Freq: Every day | ORAL | Status: DC
  Administered 2021-04-02 – 2021-04-10 (×9): 240 mg via ORAL
  Filled 2021-04-01 (×9): qty 1

## 2021-04-01 MED ORDER — DIVALPROEX SODIUM ER 500 MG PO TB24
500.0000 mg | ORAL_TABLET | Freq: Every day | ORAL | Status: DC
  Administered 2021-04-01 – 2021-04-09 (×9): 500 mg via ORAL
  Filled 2021-04-01 (×9): qty 1

## 2021-04-01 MED ORDER — ALBUTEROL SULFATE (2.5 MG/3ML) 0.083% IN NEBU
2.5000 mg | INHALATION_SOLUTION | RESPIRATORY_TRACT | Status: DC | PRN
  Administered 2021-04-02 – 2021-04-09 (×3): 2.5 mg via RESPIRATORY_TRACT
  Filled 2021-04-01 (×3): qty 3

## 2021-04-01 MED ORDER — ALBUTEROL SULFATE HFA 108 (90 BASE) MCG/ACT IN AERS
2.0000 | INHALATION_SPRAY | RESPIRATORY_TRACT | Status: DC | PRN
  Filled 2021-04-01: qty 6.7

## 2021-04-01 MED ORDER — HYDRALAZINE HCL 50 MG PO TABS
50.0000 mg | ORAL_TABLET | Freq: Two times a day (BID) | ORAL | Status: DC
  Administered 2021-04-01 – 2021-04-06 (×10): 50 mg via ORAL
  Filled 2021-04-01 (×10): qty 1

## 2021-04-01 MED ORDER — INSULIN ASPART 100 UNIT/ML IJ SOLN
0.0000 [IU] | Freq: Three times a day (TID) | INTRAMUSCULAR | Status: DC
  Administered 2021-04-02 (×2): 3 [IU] via SUBCUTANEOUS
  Administered 2021-04-02: 2 [IU] via SUBCUTANEOUS

## 2021-04-01 MED ORDER — PREDNISONE 5 MG PO TABS
50.0000 mg | ORAL_TABLET | Freq: Every day | ORAL | Status: DC
  Administered 2021-04-02: 50 mg via ORAL
  Filled 2021-04-01: qty 2

## 2021-04-01 MED ORDER — ALBUTEROL SULFATE (2.5 MG/3ML) 0.083% IN NEBU
12.0000 mL | INHALATION_SOLUTION | RESPIRATORY_TRACT | Status: DC
  Administered 2021-04-01: 12 mL via RESPIRATORY_TRACT

## 2021-04-01 MED ORDER — POTASSIUM CHLORIDE CRYS ER 20 MEQ PO TBCR
40.0000 meq | EXTENDED_RELEASE_TABLET | Freq: Once | ORAL | Status: AC
  Administered 2021-04-01: 40 meq via ORAL
  Filled 2021-04-01: qty 2

## 2021-04-01 MED ORDER — MEMANTINE HCL 10 MG PO TABS
10.0000 mg | ORAL_TABLET | Freq: Two times a day (BID) | ORAL | Status: DC
  Administered 2021-04-01 – 2021-04-10 (×18): 10 mg via ORAL
  Filled 2021-04-01 (×19): qty 1

## 2021-04-01 MED ORDER — BENZONATATE 100 MG PO CAPS
200.0000 mg | ORAL_CAPSULE | Freq: Once | ORAL | Status: AC
  Administered 2021-04-01: 200 mg via ORAL
  Filled 2021-04-01: qty 2

## 2021-04-01 MED ORDER — PANTOPRAZOLE SODIUM 40 MG PO TBEC
40.0000 mg | DELAYED_RELEASE_TABLET | Freq: Every day | ORAL | Status: DC
  Administered 2021-04-02 – 2021-04-10 (×9): 40 mg via ORAL
  Filled 2021-04-01 (×9): qty 1

## 2021-04-01 MED ORDER — CHLORTHALIDONE 25 MG PO TABS
25.0000 mg | ORAL_TABLET | Freq: Every day | ORAL | Status: DC
  Administered 2021-04-02 – 2021-04-05 (×4): 25 mg via ORAL
  Filled 2021-04-01 (×5): qty 1

## 2021-04-01 MED ORDER — ACETAMINOPHEN 325 MG PO TABS
650.0000 mg | ORAL_TABLET | Freq: Four times a day (QID) | ORAL | Status: DC | PRN
  Administered 2021-04-02 (×2): 650 mg via ORAL
  Filled 2021-04-01 (×2): qty 2

## 2021-04-01 NOTE — Telephone Encounter (Signed)
Patient is returning phone call. Patient phone number is [phone].

## 2021-04-01 NOTE — H&P (Signed)
History and Physical   [name] HEN:[id number] DOB: 1950-06-21 DOA: 04/01/2021  PCP: [name], MD   Patient coming from: Home  Chief Complaint: Asthma flare  HPI: [name] is a 70 y.o. female with medical history significant of asthma, CAD, CKD 3, CHF, depression, anxiety, diabetes, hypertension, GERD, hyperlipidemia, macrocytic anemia, pulmonary embolism, seizure presenting for asthma flare.  Patient states that she has been having issue with her asthma and significant wheezing for the last 5 days or so.  She states has not been responding to her home inhalers nor nebulizers.  She tried contacting her pulmonologist who instructed her to increase her dose of her nebulizer but this does not help.  Was around grandchildren with upper respiratory symptoms recently but they were not diagnosed with a specific viral illness.  She denies fevers, chills, chest pain, shortness of breath, abdominal pain, constipation, diarrhea, nausea, vomiting.   ED Course: Vital signs in the ED were stable.  Lab work-up showed BMP with potassium of 3.3, creatinine stable at 1.45, glucose 132.  CBC showed hemoglobin stable at 11.9.  Respiratory panel for flu and COVID pending.  Chest x-ray showed no acute normality.  Patient received duo nebs, albuterol, magnesium and prednisone in the ED.  Review of Systems: As per HPI otherwise all other systems reviewed and are negative.  Past Medical History:  Diagnosis Date   Anxiety    Arthritis    Asthma    Chronic diastolic CHF (congestive heart failure) ([place]) 07/17/2016   Coronary artery disease    a. NSTEMI 12/2015 - LHC 01/18/16: s/p overlapping DESx2 to RCA, 10% ost-prox Cx,10% mLAD.   Dementia ([place])    Depression    Diabetes mellitus    Dyslipidemia    GERD (gastroesophageal reflux disease)    History of seizure    Hypertension    Hypertensive heart disease    NSTEMI (non-ST elevated myocardial infarction) ([place]) 12/2015   Stage 3 chronic  kidney disease (HCC)     Past Surgical History:  Procedure Laterality Date   ABDOMINAL HYSTERECTOMY     CARDIAC CATHETERIZATION  1994   minimal LAD dz, no other dz, EF normal   CARDIAC CATHETERIZATION N/A 01/18/2016   Procedure: Left Heart Cath and Coronary Angiography;  Surgeon: Burnell Blanks, MD;  Location: [facility];  Service: Cardiovascular;  Laterality: N/A;   CARDIAC CATHETERIZATION N/A 01/18/2016   Procedure: Coronary Stent Intervention;  Surgeon: Burnell Blanks, MD;  Location: [facility];  Service: Cardiovascular;  Laterality: N/A;   COLONOSCOPY WITH PROPOFOL N/A 04/09/2014   Procedure: COLONOSCOPY WITH PROPOFOL;  Surgeon: Cleotis Nipper, MD;  Location: [facility];  Service: Endoscopy;  Laterality: N/A;   CORONARY STENT PLACEMENT  01/19/2016   STENT SYNERGY DES [id number] drug eluting stent was successfully placed, and overlaps the 2.5 x 38 mm Synergy stent placed distally.   ESOPHAGOGASTRODUODENOSCOPY (EGD) WITH PROPOFOL N/A 04/09/2014   Procedure: ESOPHAGOGASTRODUODENOSCOPY (EGD) WITH PROPOFOL;  Surgeon: Cleotis Nipper, MD;  Location: [facility];  Service: Endoscopy;  Laterality: N/A;   FOOT SURGERY Bilateral    approx ~2000   HEMORRHOID SURGERY     HERNIA REPAIR     KNEE ARTHROSCOPY     RADIOACTIVE SEED GUIDED EXCISIONAL BREAST BIOPSY Left 01/20/2021   Procedure: RADIOACTIVE SEED GUIDED EXCISIONAL LEFT BREAST BIOPSY;  Surgeon: Stark Klein, MD;  Location: [facility];  Service: General;  Laterality: Left;    Social History  reports that she quit  smoking about 17 years ago. Her smoking use included cigarettes. She has a 30.00 pack-year smoking history. She has never used smokeless tobacco. She reports that she does not drink alcohol and does not use drugs.  Allergies  Allergen Reactions   Tramadol Other (See Comments)    Seizure d/o    Family History  Problem Relation Age of Onset   Allergies Mother    Asthma Mother    CAD Father 16    Diabetes Father    Hypertension Father    Congestive Heart Failure Sister        died in her 93s   CAD Sister 68  Reviewed on admission  Prior to Admission medications   Medication Sig Start Date End Date Taking? Authorizing Provider  albuterol (PROAIR HFA) 108 (90 Base) MCG/ACT inhaler Inhale 2 puffs into the lungs every 4 (four) hours as needed for wheezing or shortness of breath. 04/08/19   Tanda Rockers, MD  albuterol (PROVENTIL) (2.5 MG/3ML) 0.083% nebulizer solution Take 2.5 mg by nebulization every 4 (four) hours as needed for wheezing or shortness of breath.    [provider]  ALPRAZolam Duanne Moron) 0.5 MG tablet Take 1 tablet (0.5 mg total) by mouth 2 (two) times daily as needed for anxiety. 07/31/15   Theodis Blaze, MD  atorvastatin (LIPITOR) 40 MG tablet Take 40 mg by mouth at bedtime.    [provider]  Biotin 5000 MCG CAPS Take 5,000 mcg by mouth daily.    [provider]  budesonide (PULMICORT) 0.5 MG/2ML nebulizer solution USE 1 VIAL IN NEBULIZER IN THE MORNING AND AT BEDTIME 03/29/21   Tanda Rockers, MD  budesonide (PULMICORT) 0.5 MG/2ML nebulizer solution USE 1 VIAL IN NEBULIZER IN THE MORNING AND AT BEDTIME 03/29/21   Tanda Rockers, MD  chlorthalidone (HYGROTON) 25 MG tablet Take 1 tablet (25 mg total) by mouth daily. 03/21/21   Jerline Pain, MD  diltiazem (CARDIZEM CD) 240 MG 24 hr capsule Take 1 capsule (240 mg total) by mouth daily. 03/28/21   Jerline Pain, MD  divalproex (DEPAKOTE ER) 500 MG 24 hr tablet Take 1 tablet (500 mg total) by mouth at bedtime. 03/15/21   Marcial Pacas, MD  esomeprazole (NEXIUM) 40 MG capsule Take 40 mg by mouth 2 (two) times daily before a meal.    [provider]  fluticasone (FLONASE) 50 MCG/ACT nasal spray Place 2 sprays into both nostrils daily as needed for allergies.    [provider]  formoterol (PERFOROMIST) 20 MCG/2ML nebulizer solution USE 1 VIAL  IN  NEBULIZER TWICE  DAILY - morning  and evening 03/29/21   Tanda Rockers, MD  hydrALAZINE (APRESOLINE) 50 MG tablet Take 1 tablet (50 mg total) by mouth 2 (two) times daily. 03/24/20   Jerline Pain, MD  latanoprost (XALATAN) 0.005 % ophthalmic solution Place 1 drop into both eyes at bedtime. 02/05/20   [provider]  memantine (NAMENDA) 10 MG tablet Take 1 tablet (10 mg total) by mouth 2 (two) times daily. 03/15/21   Marcial Pacas, MD  metFORMIN (GLUCOPHAGE) 500 MG tablet Take 500 mg by mouth 2 (two) times daily with a meal.    [provider]  montelukast (SINGULAIR) 10 MG tablet Take 1 tablet (10 mg total) by mouth at bedtime. 01/11/21   Tanda Rockers, MD  Multiple Vitamin (MULTIVITAMIN WITH MINERALS) TABS tablet Take 1 tablet by mouth daily.    [provider]  nebivolol (BYSTOLIC)  10 MG tablet Take 1 tablet (10 mg total) by mouth daily. 04/10/19   Jerline Pain, MD  nitroGLYCERIN (NITROSTAT) 0.4 MG SL tablet DISSOLVE ONE TABLET UNDER THE TONGUE EVERY 5 MINUTES AS NEEDED FOR CHEST PAIN.  DO NOT EXCEED A TOTAL OF 3 DOSES IN 15 MINUTES 11/19/19   Burtis Junes, NP  potassium chloride SA (K-DUR,KLOR-CON) 20 MEQ tablet Take 20 mEq by mouth daily.     [provider]  Probiotic Product (EQ PROBIOTIC) CAPS Take 1 capsule by mouth daily.    [provider]    Physical Exam: Vitals:   04/01/21 0558 04/01/21 1005 04/01/21 1310 04/01/21 1623  BP: (!) 152/79 (!) 148/84 137/84 (!) 155/80  Pulse: 82 80 81 93  Resp: (!) 22 16 20 18   Temp:   98.3 F (36.8 C)   TempSrc:   Oral   SpO2: 97% 99% 98% 100%   Physical Exam Constitutional:      General: She is not in acute distress.    Appearance: Normal appearance.  HENT:     Head: Normocephalic and atraumatic.     Mouth/Throat:     Mouth: Mucous membranes are moist.     Pharynx: Oropharynx is clear.  Eyes:     Extraocular Movements: Extraocular movements intact.     Pupils: Pupils are equal, round, and reactive to light.   Cardiovascular:     Rate and Rhythm: Normal rate and regular rhythm.     Pulses: Normal pulses.     Heart sounds: Murmur heard.  Pulmonary:     Effort: Pulmonary effort is normal. No respiratory distress.     Breath sounds: Wheezing present.  Abdominal:     General: Bowel sounds are normal. There is no distension.     Palpations: Abdomen is soft.     Tenderness: There is no abdominal tenderness.  Musculoskeletal:        General: No swelling or deformity.  Skin:    General: Skin is warm and dry.  Neurological:     General: No focal deficit present.     Mental Status: Mental status is at baseline.   Labs on Admission: I have personally reviewed following labs and imaging studies  CBC: Recent Labs  Lab 04/01/21 0745  WBC 6.6  NEUTROABS 3.5  HGB 11.9*  HCT 36.4  MCV 95.5  PLT 426    Basic Metabolic Panel: Recent Labs  Lab 04/01/21 0745  NA 138  K 3.3*  CL 102  CO2 26  GLUCOSE 132*  BUN 19  CREATININE 1.45*  CALCIUM 9.3    GFR: CrCl cannot be calculated (Unknown ideal weight.).  Liver Function Tests: No results for input(s): AST, ALT, ALKPHOS, BILITOT, PROT, ALBUMIN in the last 168 hours.  Urine analysis:    Component Value Date/Time   COLORURINE YELLOW 12/20/2017 0238   APPEARANCEUR CLEAR 12/20/2017 0238   LABSPEC 1.011 12/20/2017 0238   PHURINE 5.0 12/20/2017 0238   GLUCOSEU NEGATIVE 12/20/2017 0238   HGBUR NEGATIVE 12/20/2017 0238   BILIRUBINUR NEGATIVE 12/20/2017 0238   KETONESUR NEGATIVE 12/20/2017 0238   PROTEINUR NEGATIVE 12/20/2017 0238   UROBILINOGEN 0.2 08/07/2014 1304   NITRITE NEGATIVE 12/20/2017 0238   LEUKOCYTESUR NEGATIVE 12/20/2017 0238    Radiological Exams on Admission: DG Chest 2 View  Result Date: 04/01/2021 CLINICAL DATA:  Cough and wheezing. EXAM: CHEST - 2 VIEW COMPARISON:  01/23/2021 FINDINGS: The heart size and mediastinal contours are within normal limits. Both lungs are clear. The  visualized skeletal structures are  unremarkable. IMPRESSION: No active cardiopulmonary disease. Electronically Signed   By: Kerby Moors M.D.   On: 04/01/2021 07:21    EKG: Independently reviewed.  Sinus rhythm 85 bpm.  Nonspecific T wave flattening.  Assessment/Plan Principal Problem:   Asthma exacerbation Active Problems:   Depression with anxiety   Essential hypertension   G E R D   Diabetes mellitus type 2, controlled (HCC)   CAD in native artery   Seizures (HCC)   HLD (hyperlipidemia)   Chronic diastolic CHF (congestive heart failure) (HCC)   Macrocytic anemia   CKD (chronic kidney disease), stage III (HCC)  Asthma exacerbation > Presenting with 4 to 5 days of asthma exacerbation.  Unable to be controlled with home inhalers or nebulizers.  She had called her pulmonologist office who would recommend increasing the dose of her now but this has not helped. > Was audibly wheezing on presentation.  Did receive multiple rounds of duo nebs, prednisone, albuterol in the ED as well as magnesium without significant improvement still wheezing. > Not hypoxic but is somewhat tachypneic and as above not significantly improved. > Was exposed to sick grandchildren's with URI symptoms though they were not diagnosed with any specific viral illness. - Monitor on telemetry overnight - Follow-up COVID and respiratory viral panel - Schedule DuoNebs every 6 hours - As needed albuterol nebs - Daily steroids -Continue home Singulair  Hypokalemia CKD 3 > Patient did have potassium of 3.3 in the ED.  Creatinine stable at 1.45. - We will replete with 40 mEq of p.o. potassium. - No need to check magnesium as patient is already receiving magnesium IV for treatment of asthma as above.  CHF Hypertension CAD, HLD > Last echo in 2019 with EF 65-70% with G2 DD.  Not on any diuretics. > History of CAD with NSTEMI last cath in 2017 at the time of event with multiple lesions of the RCA which were stented. - Continue home chlorthalidone,  diltiazem, hydralazine, nebivolol - Continue home atorvastatin  Diabetes - SSI  Depression Anxiety - Continue home Xanax   GERD - Continue home PPI  Anemia > Hemoglobin stable at 1.9 - Continue to trend CBC  Seizures - Continue home Depakote  DVT prophylaxis: Lovenox  Code Status:   Full  Family Communication:  None on admission.  Patient states her family is up-to-date on her condition and that she is staying in the hospital. Disposition Plan:   Patient is from:  Home  Anticipated DC to:  Home  Anticipated DC date:  1 to 3 days  Anticipated DC barriers: None  Consults called:  None  Admission status:  Observation, telemetry   Severity of Illness: The appropriate patient status for this patient is OBSERVATION. Observation status is judged to be reasonable and necessary in order to provide the required intensity of service to ensure the patient's safety. The patient's presenting symptoms, physical exam findings, and initial radiographic and laboratory data in the context of their medical condition is felt to place them at decreased risk for further clinical deterioration. Furthermore, it is anticipated that the patient will be medically stable for discharge from the hospital within 2 midnights of admission.    Marcelyn Bruins MD [facility]  How to contact the [facility] Attending or Consulting provider [place] or covering provider during after hours [place], for this patient?   Check the care team in [facility] and look for a) attending/consulting [place] provider listed and b)  the [facility] team listed Log into [email or web address] and use [place]'s universal password to access. If you do not have the password, please contact the hospital operator. Locate the [facility] provider you are looking for under [facility] and page to a number that you can be directly reached. If you still have difficulty reaching the provider, please page the [facility] (Director on Call) for the Hospitalists listed on  amion for assistance.  04/01/2021, 8:37 PM

## 2021-04-01 NOTE — Telephone Encounter (Signed)
LMTCB

## 2021-04-01 NOTE — ED Provider Notes (Signed)
[facility] EMERGENCY DEPARTMENT Provider Note   CSN: [id number] Arrival date & time: 04/01/21  0555     History Chief Complaint  Patient presents with   Asthma    [name] is a 70 y.o. female.  [name] is a 70 y/o F w/ PMHx of diastolic HF and asthma presenting today for SOB, cough, and wheezing. Patient started experiencing symptoms 4 days ago. She notes an increase in her budesonide/Formoterol nebulizer treatments from once daily to twice daily over the past 4 days. She has also be using her albuterol inhaler 2 puffs every 4 hours with little relief to symptoms which she does not typically need daily. She has also been coughing up yellow sputum and denies hematemesis. Patient follows with a pulmonologist for her asthma.  She reports that she has chest pain but only with coughing, no pleuritic pain and denies jaw pain, arm pain, and abdominal pain. Patient is eating and drinking well at home. Patient has not missed any recent doses of her medications. Denies fever, chills, sore throat, N/V/D, lightheadedness, dizziness, palpitations, and edema.  She reports that she was around her grandchildren last week who had colds, denies any known exposures to flu or COVID.  Called her pulmonologist office to try and get additional medications to help with her symptoms, but due to continued worsening in her breathing she presented for further evaluation.  She has not been on steroids recently. Reports no worsening swelling in her legs recently.  The history is provided by the patient, a relative and medical records.       Past Medical History:  Diagnosis Date   Anxiety    Arthritis    Asthma    Chronic diastolic CHF (congestive heart failure) ([place]) 07/17/2016   Coronary artery disease    a. NSTEMI 12/2015 - LHC 01/18/16: s/p overlapping DESx2 to RCA, 10% ost-prox Cx,10% mLAD.   Dementia ([place])    Depression    Diabetes mellitus    Dyslipidemia    GERD  (gastroesophageal reflux disease)    History of seizure    Hypertension    Hypertensive heart disease    NSTEMI (non-ST elevated myocardial infarction) ([place]) 12/2015   Stage 3 chronic kidney disease ([place])     Patient Active Problem List   Diagnosis Date Noted   Abnormal swallowing 03/29/2021   Acute non-ST segment elevation myocardial infarction ([place]) 03/29/2021   Chronic diarrhea 03/29/2021   Daytime somnolence 03/29/2021   Abnormal mammogram of left breast 12/13/2020   Acute respiratory disease due to COVID-19 virus 06/16/2020   Wheezing 09/13/2019   Memory loss 03/20/2019   Severe persistent asthma without complication 50/38/8828   DM2 (diabetes mellitus, type 2) ([place]) 03/15/2018   CKD (chronic kidney disease), stage III ([place]) 03/15/2018   Dyspnea 08/12/2017   Macrocytic anemia 08/12/2017   Therapeutic drug monitoring 05/17/2017   Lung nodule 08/01/2016   Influenza B 07/18/2016   Chronic diastolic CHF (congestive heart failure) ([place]) 07/17/2016   Acute renal failure superimposed on stage 3 chronic kidney disease ([place]) 07/17/2016   Right leg swelling 02/27/2016   Acute respiratory failure with hypoxia (H[facility])    Uncontrolled type 2 diabetes mellitus with complication    Acute pulmonary embolism ([facility])    HLD (hyperlipidemia)    Seizures ([place]) 01/27/2016   Numbness 01/27/2016   CAD in native artery 01/20/2016   Hypertensive heart disease 01/20/2016   NSTEMI (non-ST elevated myocardial infarction) ([place]) 01/18/2016   Acute bronchitis with  asthma with acute exacerbation 01/16/2016   Hypokalemia 01/16/2016   Hypomagnesemia 01/16/2016   Acute respiratory failure ([place]) 01/16/2016   Dyspnea on exertion    Asthma exacerbation 07/21/2015   Upper airway cough syndrome with VCD 08/28/2014   Diabetes mellitus type 2, controlled ([place]) 08/06/2014   Hyperlipidemia 08/06/2014   Allergic rhinitis 07/22/2008   OTHER DISEASES OF VOCAL CORDS 07/22/2008   DYSPNEA 07/22/2008   Diabetes  mellitus type 2, uncontrolled 07/21/2008   DIABETIC PERIPHERAL NEUROPATHY 07/21/2008   Depression with anxiety 07/21/2008   Essential hypertension 07/21/2008   Mild persistent chronic asthma without complication 29/52/8413   G E R D 07/21/2008   PULMONARY EMBOLISM, HX OF 07/21/2008    Past Surgical History:  Procedure Laterality Date   ABDOMINAL HYSTERECTOMY     CARDIAC CATHETERIZATION  1994   minimal LAD dz, no other dz, EF normal   CARDIAC CATHETERIZATION N/A 01/18/2016   Procedure: Left Heart Cath and Coronary Angiography;  Surgeon: Burnell Blanks, MD;  Location: [facility];  Service: Cardiovascular;  Laterality: N/A;   CARDIAC CATHETERIZATION N/A 01/18/2016   Procedure: Coronary Stent Intervention;  Surgeon: Burnell Blanks, MD;  Location: [place] [facility];  Service: Cardiovascular;  Laterality: N/A;   COLONOSCOPY WITH PROPOFOL N/A 04/09/2014   Procedure: COLONOSCOPY WITH PROPOFOL;  Surgeon: Cleotis Nipper, MD;  Location: [facility];  Service: Endoscopy;  Laterality: N/A;   CORONARY STENT PLACEMENT  01/19/2016   STENT SYNERGY DES [id number] drug eluting stent was successfully placed, and overlaps the 2.5 x 38 mm Synergy stent placed distally.   ESOPHAGOGASTRODUODENOSCOPY (EGD) WITH PROPOFOL N/A 04/09/2014   Procedure: ESOPHAGOGASTRODUODENOSCOPY (EGD) WITH PROPOFOL;  Surgeon: Cleotis Nipper, MD;  Location: [facility];  Service: Endoscopy;  Laterality: N/A;   FOOT SURGERY Bilateral    approx ~2000   HEMORRHOID SURGERY     HERNIA REPAIR     KNEE ARTHROSCOPY     RADIOACTIVE SEED GUIDED EXCISIONAL BREAST BIOPSY Left 01/20/2021   Procedure: RADIOACTIVE SEED GUIDED EXCISIONAL LEFT BREAST BIOPSY;  Surgeon: Stark Klein, MD;  Location: [facility];  Service: General;  Laterality: Left;     OB History   No obstetric history on file.     Family History  Problem Relation Age of Onset   Allergies Mother    Asthma Mother    CAD Father 66   Diabetes Father     Hypertension Father    Congestive Heart Failure Sister        died in her 43s   CAD Sister 20    Social History   Tobacco Use   Smoking status: Former    Packs/day: 1.00    Years: 30.00    Pack years: 30.00    Types: Cigarettes    Quit date: 01/09/2004    Years since quitting: 17.2   Smokeless tobacco: Never  Vaping Use   Vaping Use: Never used  Substance Use Topics   Alcohol use: No    Alcohol/week: 0.0 standard drinks   Drug use: No    Home Medications Prior to Admission medications   Medication Sig Start Date End Date Taking? Authorizing Provider  albuterol (PROAIR HFA) 108 (90 Base) MCG/ACT inhaler Inhale 2 puffs into the lungs every 4 (four) hours as needed for wheezing or shortness of breath. 04/08/19   Tanda Rockers, MD  albuterol (PROVENTIL) (2.5 MG/3ML) 0.083% nebulizer solution Take 2.5 mg by nebulization every 4 (four) hours as needed for wheezing or shortness  of breath.    [provider]  ALPRAZolam Duanne Moron) 0.5 MG tablet Take 1 tablet (0.5 mg total) by mouth 2 (two) times daily as needed for anxiety. 07/31/15   Theodis Blaze, MD  atorvastatin (LIPITOR) 40 MG tablet Take 40 mg by mouth at bedtime.    [provider]  Biotin 5000 MCG CAPS Take 5,000 mcg by mouth daily.    [provider]  budesonide (PULMICORT) 0.5 MG/2ML nebulizer solution USE 1 VIAL IN NEBULIZER IN THE MORNING AND AT BEDTIME 03/29/21   Tanda Rockers, MD  budesonide (PULMICORT) 0.5 MG/2ML nebulizer solution USE 1 VIAL IN NEBULIZER IN THE MORNING AND AT BEDTIME 03/29/21   Tanda Rockers, MD  chlorthalidone (HYGROTON) 25 MG tablet Take 1 tablet (25 mg total) by mouth daily. 03/21/21   Jerline Pain, MD  diltiazem (CARDIZEM CD) 240 MG 24 hr capsule Take 1 capsule (240 mg total) by mouth daily. 03/28/21   Jerline Pain, MD  divalproex (DEPAKOTE ER) 500 MG 24 hr tablet Take 1 tablet (500 mg total) by mouth at bedtime. 03/15/21   Marcial Pacas, MD  esomeprazole (NEXIUM) 40 MG  capsule Take 40 mg by mouth 2 (two) times daily before a meal.    [provider]  fluticasone (FLONASE) 50 MCG/ACT nasal spray Place 2 sprays into both nostrils daily as needed for allergies.    [provider]  formoterol (PERFOROMIST) 20 MCG/2ML nebulizer solution USE 1 VIAL  IN  NEBULIZER TWICE  DAILY - morning and evening 03/29/21   Tanda Rockers, MD  hydrALAZINE (APRESOLINE) 50 MG tablet Take 1 tablet (50 mg total) by mouth 2 (two) times daily. 03/24/20   Jerline Pain, MD  latanoprost (XALATAN) 0.005 % ophthalmic solution Place 1 drop into both eyes at bedtime. 02/05/20   [provider]  memantine (NAMENDA) 10 MG tablet Take 1 tablet (10 mg total) by mouth 2 (two) times daily. 03/15/21   Marcial Pacas, MD  metFORMIN (GLUCOPHAGE) 500 MG tablet Take 500 mg by mouth 2 (two) times daily with a meal.    [provider]  montelukast (SINGULAIR) 10 MG tablet Take 1 tablet (10 mg total) by mouth at bedtime. 01/11/21   Tanda Rockers, MD  Multiple Vitamin (MULTIVITAMIN WITH MINERALS) TABS tablet Take 1 tablet by mouth daily.    [provider]  nebivolol (BYSTOLIC) 10 MG tablet Take 1 tablet (10 mg total) by mouth daily. 04/10/19   Jerline Pain, MD  nitroGLYCERIN (NITROSTAT) 0.4 MG SL tablet DISSOLVE ONE TABLET UNDER THE TONGUE EVERY 5 MINUTES AS NEEDED FOR CHEST PAIN.  DO NOT EXCEED A TOTAL OF 3 DOSES IN 15 MINUTES 11/19/19   Burtis Junes, NP  potassium chloride SA (K-DUR,KLOR-CON) 20 MEQ tablet Take 20 mEq by mouth daily.     [provider]  Probiotic Product (EQ PROBIOTIC) CAPS Take 1 capsule by mouth daily.    [provider]    Allergies    Tramadol  Review of Systems   Review of Systems  Constitutional:  Negative for chills and fever.  HENT: Negative.    Respiratory:  Positive for cough, shortness of breath and wheezing.   Cardiovascular:  Negative for chest pain, palpitations and leg swelling.  Gastrointestinal:   Negative for abdominal pain, nausea and vomiting.  Genitourinary:  Negative for dysuria.  Musculoskeletal:  Negative for myalgias.  Skin:  Negative for color change and rash.  Neurological:  Negative for  dizziness and syncope.   Physical Exam Updated Vital Signs BP 137/84 (BP Location: Left Arm)   Pulse 81   Temp 98.3 F (36.8 C) (Oral)   Resp 20   LMP  (LMP Unknown)   SpO2 98%   Physical Exam Vitals and nursing note reviewed.  Constitutional:      General: She is not in acute distress.    Appearance: Normal appearance. She is well-developed. She is not ill-appearing or diaphoretic.  HENT:     Head: Normocephalic and atraumatic.     Nose: Rhinorrhea present.     Mouth/Throat:     Mouth: Mucous membranes are moist.     Pharynx: Oropharynx is clear.  Eyes:     General:        Right eye: No discharge.        Left eye: No discharge.  Cardiovascular:     Rate and Rhythm: Normal rate and regular rhythm.     Pulses: Normal pulses.     Heart sounds: Normal heart sounds.  Pulmonary:     Effort: Pulmonary effort is normal. No respiratory distress.     Breath sounds: Wheezing present. No rales.     Comments: At rest patient has mild tachypneic but does not have increased respiratory effort.  She does become more dyspneic with activity, currently satting well on room air, frequent cough during exam, she has decreased air movement with significant expiratory wheezing noted bilaterally. Abdominal:     General: Bowel sounds are normal. There is no distension.     Palpations: Abdomen is soft. There is no mass.     Tenderness: There is no abdominal tenderness. There is no guarding.     Comments: Abdomen soft, nondistended, nontender to palpation in all quadrants without guarding or peritoneal signs  Musculoskeletal:        General: No deformity.     Cervical back: Neck supple.     Comments: Trace bilateral lower extremity edema which patient reports is unchanged from baseline  Skin:     General: Skin is warm and dry.     Capillary Refill: Capillary refill takes less than 2 seconds.  Neurological:     Mental Status: She is alert and oriented to person, place, and time.     Coordination: Coordination normal.     Comments: Speech is clear, able to follow commands Moves extremities without ataxia, coordination intact  Psychiatric:        Mood and Affect: Mood normal.        Behavior: Behavior normal.    ED Results / Procedures / Treatments   Labs (all labs ordered are listed, but only abnormal results are displayed) Labs Reviewed  BASIC METABOLIC PANEL - Abnormal; Notable for the following components:      Result Value   Potassium 3.3 (*)    Glucose, Bld 132 (*)    Creatinine, Ser 1.45 (*)    GFR, Estimated 38 (*)    All other components within normal limits  CBC WITH DIFFERENTIAL/PLATELET - Abnormal; Notable for the following components:   RBC 3.81 (*)    Hemoglobin 11.9 (*)    All other components within normal limits  RESP PANEL BY RT-PCR (FLU A&B, COVID) [id number]    EKG EKG Interpretation  Date/Time:  Friday April 01 2021 06:05:01 EST Ventricular Rate:  85 PR Interval:  166 QRS Duration: 84 QT Interval:  356 QTC Calculation: 423 R Axis:   -10 Text Interpretation: Normal sinus rhythm Nonspecific T  wave abnormality Abnormal ECG Confirmed by Ripley Fraise [phone]) on 04/01/2021 6:16:07 AM  Radiology DG Chest 2 View  Result Date: 04/01/2021 CLINICAL DATA:  Cough and wheezing. EXAM: CHEST - 2 VIEW COMPARISON:  01/23/2021 FINDINGS: The heart size and mediastinal contours are within normal limits. Both lungs are clear. The visualized skeletal structures are unremarkable. IMPRESSION: No active cardiopulmonary disease. Electronically Signed   By: Kerby Moors M.D.   On: 04/01/2021 07:21    Procedures .Critical Care Performed by: Jacqlyn Larsen, PA-C Authorized by: Jacqlyn Larsen, PA-C   Critical care provider statement:    Critical care time  (minutes):  30   Critical care was necessary to treat or prevent imminent or life-threatening deterioration of the following conditions:  Respiratory failure (Asthma exacerbation)   Critical care was time spent personally by me on the following activities:  Development of treatment plan with patient or surrogate, discussions with consultants, evaluation of patient's response to treatment, examination of patient, ordering and review of laboratory studies, ordering and review of radiographic studies, ordering and performing treatments and interventions, pulse oximetry, re-evaluation of patient's condition and review of old charts   Medications Ordered in ED Medications  albuterol (VENTOLIN HFA) 108 (90 Base) MCG/ACT inhaler 2 puff (has no administration in time range)  albuterol (PROVENTIL) (2.5 MG/3ML) 0.083% nebulizer solution 12 mL (has no administration in time range)  ipratropium-albuterol (DUONEB) 0.5-2.5 (3) MG/3ML nebulizer solution 3 mL (3 mLs Nebulization Given 04/01/21 4163)  predniSONE (DELTASONE) tablet 60 mg (60 mg Oral Given 04/01/21 1653)  ipratropium-albuterol (DUONEB) 0.5-2.5 (3) MG/3ML nebulizer solution 3 mL (3 mLs Nebulization Given 04/01/21 1653)  benzonatate (TESSALON) capsule 200 mg (200 mg Oral Given 04/01/21 1653)    ED Course  I have reviewed the triage vital signs and the nursing notes.  Pertinent labs & imaging results that were available during my care of the patient were reviewed by me and considered in my medical decision making (see chart for details).    MDM Rules/Calculators/A&P                           70 y.o. female presents to the ED with complaints of shortness of breath, cough, wheezing, this involves an extensive number of treatment options, and is a complaint that carries with it a high risk of complications and morbidity.  The differential diagnosis includes asthma exacerbation, CHF exacerbation, pneumonia, bronchitis, COVID, influenza, PE   Feel that  asthma exacerbation is most likely, patient appears euvolemic, she is not experiencing chest pain outside of coughing to suggest ACS, low suspicion for PE given significant wheezing on exam  On arrival pt is nontoxic, vitals overall reassuring, mildly hypertensive and tachypneic on arrival. Exam significant for mild tachypnea but no significant increased work of breathing or signs of respiratory distress, patient with frequent dry cough and on auscultation she has decreased air movement with diffuse expiratory wheezing  Additional history obtained from daughter at bedside. Previous records obtained and reviewed via EMR  I ordered DuoNeb and steroids as well as Best boy for cough  Lab Tests:  I Ordered, reviewed, and interpreted labs, which included:  CBC: No leukocytosis, hemoglobin stable, BMP: No significant electrolyte derangements, creatinine at baseline COVID/flu: Negative  Imaging Studies ordered:  I ordered imaging studies which included chest x-ray, I independently visualized and interpreted imaging which showed no pneumonia, pulmonary edema or other active cardiopulmonary disease  ED Course:   Patient received  2 DuoNeb's with very little improvement in her symptoms, proceeded with 1 hour continuous albuterol treatment, after this patient has some improved air movement but continues to have diffuse expiratory wheezing and reports little to no improvement in her shortness of breath.  She is not experiencing hypoxia or increased work of breathing at rest but with activity she becomes quite dyspneic.  Given little improvement with multiple breathing treatments and steroids feel patient would benefit from admission for continued treatment for asthma exacerbation.  I consulted Dr. Pearson Grippe with [facility] hospitalist and discussed lab and imaging findings, he will see and admit the patient.    Portions of this note were generated with Lobby[employer]. Dictation errors may  occur despite best attempts at proofreading.  Final Clinical Impression(s) / ED Diagnoses Final diagnoses:  Exacerbation of asthma, unspecified asthma severity, unspecified whether persistent    Rx / DC Orders ED Discharge Orders     None        Jacqlyn Larsen, [place] 04/03/21 1440    Wyvonnia Dusky, MD 04/03/21 1501

## 2021-04-01 NOTE — ED Triage Notes (Signed)
Pt states that her asthma has been acting up for 5 days, unrelieved by home inhalers and breathing treatments. Audible wheezing

## 2021-04-01 NOTE — Telephone Encounter (Signed)
LMTCB x2  

## 2021-04-02 DIAGNOSIS — K219 Gastro-esophageal reflux disease without esophagitis: Secondary | ICD-10-CM | POA: Diagnosis present

## 2021-04-02 DIAGNOSIS — F0393 Unspecified dementia, unspecified severity, with mood disturbance: Secondary | ICD-10-CM | POA: Diagnosis present

## 2021-04-02 DIAGNOSIS — E871 Hypo-osmolality and hyponatremia: Secondary | ICD-10-CM | POA: Diagnosis not present

## 2021-04-02 DIAGNOSIS — D539 Nutritional anemia, unspecified: Secondary | ICD-10-CM | POA: Diagnosis present

## 2021-04-02 DIAGNOSIS — I10 Essential (primary) hypertension: Secondary | ICD-10-CM | POA: Diagnosis not present

## 2021-04-02 DIAGNOSIS — I130 Hypertensive heart and chronic kidney disease with heart failure and stage 1 through stage 4 chronic kidney disease, or unspecified chronic kidney disease: Secondary | ICD-10-CM | POA: Diagnosis present

## 2021-04-02 DIAGNOSIS — I252 Old myocardial infarction: Secondary | ICD-10-CM | POA: Diagnosis not present

## 2021-04-02 DIAGNOSIS — M1990 Unspecified osteoarthritis, unspecified site: Secondary | ICD-10-CM | POA: Diagnosis present

## 2021-04-02 DIAGNOSIS — Z8616 Personal history of COVID-19: Secondary | ICD-10-CM | POA: Diagnosis not present

## 2021-04-02 DIAGNOSIS — I2510 Atherosclerotic heart disease of native coronary artery without angina pectoris: Secondary | ICD-10-CM | POA: Diagnosis present

## 2021-04-02 DIAGNOSIS — E1122 Type 2 diabetes mellitus with diabetic chronic kidney disease: Secondary | ICD-10-CM | POA: Diagnosis present

## 2021-04-02 DIAGNOSIS — J4541 Moderate persistent asthma with (acute) exacerbation: Secondary | ICD-10-CM | POA: Diagnosis present

## 2021-04-02 DIAGNOSIS — E669 Obesity, unspecified: Secondary | ICD-10-CM | POA: Diagnosis present

## 2021-04-02 DIAGNOSIS — G40909 Epilepsy, unspecified, not intractable, without status epilepticus: Secondary | ICD-10-CM | POA: Diagnosis present

## 2021-04-02 DIAGNOSIS — N179 Acute kidney failure, unspecified: Secondary | ICD-10-CM | POA: Diagnosis present

## 2021-04-02 DIAGNOSIS — I5032 Chronic diastolic (congestive) heart failure: Secondary | ICD-10-CM | POA: Diagnosis present

## 2021-04-02 DIAGNOSIS — E876 Hypokalemia: Secondary | ICD-10-CM | POA: Diagnosis present

## 2021-04-02 DIAGNOSIS — E1142 Type 2 diabetes mellitus with diabetic polyneuropathy: Secondary | ICD-10-CM | POA: Diagnosis present

## 2021-04-02 DIAGNOSIS — E785 Hyperlipidemia, unspecified: Secondary | ICD-10-CM | POA: Diagnosis present

## 2021-04-02 DIAGNOSIS — Z6830 Body mass index (BMI) 30.0-30.9, adult: Secondary | ICD-10-CM | POA: Diagnosis not present

## 2021-04-02 DIAGNOSIS — E119 Type 2 diabetes mellitus without complications: Secondary | ICD-10-CM | POA: Diagnosis not present

## 2021-04-02 DIAGNOSIS — N1831 Chronic kidney disease, stage 3a: Secondary | ICD-10-CM | POA: Diagnosis present

## 2021-04-02 DIAGNOSIS — E1165 Type 2 diabetes mellitus with hyperglycemia: Secondary | ICD-10-CM | POA: Diagnosis not present

## 2021-04-02 DIAGNOSIS — J310 Chronic rhinitis: Secondary | ICD-10-CM | POA: Diagnosis present

## 2021-04-02 DIAGNOSIS — Z20822 Contact with and (suspected) exposure to covid-19: Secondary | ICD-10-CM | POA: Diagnosis present

## 2021-04-02 DIAGNOSIS — F0394 Unspecified dementia, unspecified severity, with anxiety: Secondary | ICD-10-CM | POA: Diagnosis present

## 2021-04-02 LAB — BASIC METABOLIC PANEL
Anion gap: 12 (ref 5–15)
BUN: 24 mg/dL — ABNORMAL HIGH (ref 8–23)
CO2: 22 mmol/L (ref 22–32)
Calcium: 9.5 mg/dL (ref 8.9–10.3)
Chloride: 105 mmol/L (ref 98–111)
Creatinine, Ser: 1.51 mg/dL — ABNORMAL HIGH (ref 0.44–1.00)
GFR, Estimated: 37 mL/min — ABNORMAL LOW (ref >60)
Glucose, Bld: 168 mg/dL — ABNORMAL HIGH (ref 70–99)
Potassium: 4.1 mmol/L (ref 3.5–5.1)
Sodium: 139 mmol/L (ref 135–145)

## 2021-04-02 LAB — CBC
HCT: 35.4 % — ABNORMAL LOW (ref 36.0–46.0)
Hemoglobin: 11.8 g/dL — ABNORMAL LOW (ref 12.0–15.0)
MCH: 31.1 pg (ref 26.0–34.0)
MCHC: 33.3 g/dL (ref 30.0–36.0)
MCV: 93.4 fL (ref 80.0–100.0)
Platelets: 266 10*3/uL (ref 150–400)
RBC: 3.79 MIL/uL — ABNORMAL LOW (ref 3.87–5.11)
RDW: 13.4 % (ref 11.5–15.5)
WBC: 7.8 10*3/uL (ref 4.0–10.5)
nRBC: 0 % (ref 0.0–0.2)

## 2021-04-02 LAB — GLUCOSE, CAPILLARY
Glucose-Capillary: 161 mg/dL — ABNORMAL HIGH (ref 70–99)
Glucose-Capillary: 230 mg/dL — ABNORMAL HIGH (ref 70–99)
Glucose-Capillary: 211 mg/dL — ABNORMAL HIGH (ref 70–99)
Glucose-Capillary: 356 mg/dL — ABNORMAL HIGH (ref 70–99)

## 2021-04-02 MED ORDER — IPRATROPIUM-ALBUTEROL 0.5-2.5 (3) MG/3ML IN SOLN
3.0000 mL | RESPIRATORY_TRACT | Status: DC
Start: 2021-04-02 — End: 2021-04-09
  Administered 2021-04-02 – 2021-04-08 (×39): 3 mL via RESPIRATORY_TRACT
  Administered 2021-04-09: 1 mL via RESPIRATORY_TRACT
  Administered 2021-04-09 (×4): 3 mL via RESPIRATORY_TRACT
  Filled 2021-04-02 (×45): qty 3

## 2021-04-02 MED ORDER — KETOROLAC TROMETHAMINE 15 MG/ML IJ SOLN
15.0000 mg | Freq: Once | INTRAMUSCULAR | Status: AC
  Administered 2021-04-02: 15 mg via INTRAVENOUS
  Filled 2021-04-02: qty 1

## 2021-04-02 MED ORDER — INSULIN ASPART 100 UNIT/ML IJ SOLN
0.0000 [IU] | Freq: Three times a day (TID) | INTRAMUSCULAR | Status: DC
  Administered 2021-04-03: 08:00:00 3 [IU] via SUBCUTANEOUS
  Administered 2021-04-03: 17:00:00 2 [IU] via SUBCUTANEOUS
  Administered 2021-04-03: 22:00:00 5 [IU] via SUBCUTANEOUS
  Administered 2021-04-03 – 2021-04-04 (×2): 3 [IU] via SUBCUTANEOUS
  Administered 2021-04-04 (×2): 5 [IU] via SUBCUTANEOUS
  Administered 2021-04-04 – 2021-04-05 (×2): 3 [IU] via SUBCUTANEOUS
  Administered 2021-04-05: 5 [IU] via SUBCUTANEOUS
  Administered 2021-04-05: 3 [IU] via SUBCUTANEOUS
  Administered 2021-04-05: 9 [IU] via SUBCUTANEOUS
  Administered 2021-04-06: 5 [IU] via SUBCUTANEOUS

## 2021-04-02 MED ORDER — METHYLPREDNISOLONE SODIUM SUCC 125 MG IJ SOLR: 80.0000 mg | Freq: Three times a day (TID) | INTRAMUSCULAR | Status: DC

## 2021-04-02 MED ORDER — INSULIN ASPART 100 UNIT/ML IJ SOLN
8.0000 [IU] | Freq: Once | INTRAMUSCULAR | Status: AC
  Administered 2021-04-02: 8 [IU] via SUBCUTANEOUS

## 2021-04-02 MED ORDER — METHYLPREDNISOLONE SODIUM SUCC 125 MG IJ SOLR
120.0000 mg | Freq: Two times a day (BID) | INTRAMUSCULAR | Status: DC
  Administered 2021-04-02 – 2021-04-05 (×7): 120 mg via INTRAVENOUS
  Filled 2021-04-02 (×7): qty 2

## 2021-04-02 NOTE — Progress Notes (Addendum)
PROGRESS NOTE    [name]  ZHG:[id number] DOB: March 15, 1951 DOA: 04/01/2021 PCP: [name], MD   Chief Complaint  Patient presents with   Asthma    Brief Narrative:    [name] is a 70 y.o. female with medical history significant of asthma, CAD, CKD 3, CHF, depression, anxiety, diabetes, hypertension, GERD, hyperlipidemia, macrocytic anemia, pulmonary embolism, seizure presenting for asthma flare.  Assessment & Plan:   Principal Problem:   Asthma exacerbation Active Problems:   Depression with anxiety   Essential hypertension   G E R D   Diabetes mellitus type 2, controlled (HCC)   CAD in native artery   Seizures (HCC)   HLD (hyperlipidemia)   Chronic diastolic CHF (congestive heart failure) (HCC)   Macrocytic anemia   CKD (chronic kidney disease), stage III (HCC)   Asthma exacerbation -  Presenting with 4 to 5 days of asthma exacerbation.  Unable to be controlled with home inhalers or nebulizers.  She had called her pulmonologist office who would recommend increasing the dose of her now but this has not helped. -Patient was started on scheduled nebulizer treatment and oral steroids overnight, despite that this morning she is with significant wheezing, and diminished air entry, so I have transitioned her to IV steroids, she does remain with significant wheezing today, it is still improving, but remains significantly dyspneic with minimal activity, will continue with current dose steroids. -Continue with scheduled duo nebs. -Follow on final results of respiratory viral panel, as her exacerbation most likely provoked by a viral illness. -Continue home Singulair -He was encouraged to use incentive spirometer    Hypokalemia -Repleted, monitor closely  CKD 3 -At baseline, avoid nephrotoxic medications  Chronic diastolic CHHF - Last echo in 2019 with EF 65-70% with G2 DD.  Not on any diuretics.  He appears euvolemic  Hypertension - - Continue home  chlorthalidone, diltiazem, hydralazine, nebivolol  CAD,  - History of CAD with NSTEMI last cath in 2017 at the time of event with multiple lesions of the RCA which were stented.  HLD -  Continue home atorvastatin   Diabetes - SSI   Depression Anxiety - Continue home Xanax    GERD - Continue home PPI  Anemia > Hemoglobin stable at 1.9 - Continue to trend CBC   Seizures - Continue home Depakote   DVT prophylaxis: Lovenox Code Status: Full Family Communication: none at bedside Disposition:   Status is: Inpatient  Remains inpatient appropriate because: Remains with significant wheezing and diminished air entry       Consultants:  None   Subjective:  Reports her cough has improved, as well her dyspnea, but dyspnea remains significant even with minimal activity. Objective: Vitals:   04/02/21 0800 04/02/21 0822 04/02/21 1120 04/02/21 1133  BP: (!) 156/82 (!) 153/80  (!) 153/90  Pulse: (!) 105 (!) 108  100  Resp: (!) 25 17  17   Temp:  98 F (36.7 C)  98.1 F (36.7 C)  TempSrc:  Oral  Oral  SpO2: 97% 97% 98% 98%   No intake or output data in the 24 hours ending 04/02/21 1350 There were no vitals filed for this visit.  Examination:  Awake Alert, Oriented X 3, No new F.N deficits, Normal affect Symmetrical Chest wall movement, air entry has improved, but remains diminished, as well she remains with significant wheezing. RRR,No Gallops,Rubs or new Murmurs, No Parasternal Heave +ve B.Sounds, Abd Soft, No tenderness, No rebound - guarding or rigidity. No Cyanosis,  Clubbing or edema, No new Rash or bruise        Data Reviewed: I have personally reviewed following labs and imaging studies  CBC: Recent Labs  Lab 04/01/21 0745 04/02/21 0546  WBC 6.6 7.8  NEUTROABS 3.5  --   HGB 11.9* 11.8*  HCT 36.4 35.4*  MCV 95.5 93.4  PLT 269 595    Basic Metabolic Panel: Recent Labs  Lab 04/01/21 0745 04/02/21 0546  NA 138 139  K 3.3* 4.1  CL 102 105   CO2 26 22  GLUCOSE 132* 168*  BUN 19 24*  CREATININE 1.45* 1.51*  CALCIUM 9.3 9.5    GFR: CrCl cannot be calculated (Unknown ideal weight.).  Liver Function Tests: No results for input(s): AST, ALT, ALKPHOS, BILITOT, PROT, ALBUMIN in the last 168 hours.  CBG: Recent Labs  Lab 04/02/21 0843 04/02/21 1134  GLUCAP 161* 230*     Recent Results (from the past 240 hour(s))  Resp Panel by RT-PCR (Flu A&B, Covid) Nasopharyngeal Swab     Status: None   Collection Time: 04/01/21  4:19 PM   Specimen: Nasopharyngeal Swab; Nasopharyngeal(NP) swabs in vial transport medium  Result Value Ref Range Status   SARS Coronavirus 2 by RT PCR NEGATIVE NEGATIVE Final    Comment: (NOTE) SARS-CoV-2 target nucleic acids are NOT DETECTED.  The SARS-CoV-2 RNA is generally detectable in upper respiratory specimens during the acute phase of infection. The lowest concentration of SARS-CoV-2 viral copies this assay can detect is 138 copies/mL. A negative result does not preclude SARS-Cov-2 infection and should not be used as the sole basis for treatment or other patient management decisions. A negative result may occur with  improper specimen collection/handling, submission of specimen other than nasopharyngeal swab, presence of viral mutation(s) within the areas targeted by this assay, and inadequate number of viral copies(<138 copies/mL). A negative result must be combined with clinical observations, patient history, and epidemiological information. The expected result is Negative.  Fact Sheet for Patients:  EntrepreneurPulse.com.au  Fact Sheet for Healthcare Providers:  IncredibleEmployment.be  This test is no t yet approved or cleared by the [place] FDA and  has been authorized for detection and/or diagnosis of SARS-CoV-2 by FDA under an Emergency Use Authorization (EUA). This EUA will remain  in effect (meaning this test can be used) for the  duration of the COVID-19 declaration under Section 564(b)(1) of the Act, 21 U.S.C.section 360bbb-3(b)(1), unless the authorization is terminated  or revoked sooner.       Influenza A by PCR NEGATIVE NEGATIVE Final   Influenza B by PCR NEGATIVE NEGATIVE Final    Comment: (NOTE) The Xpert Xpress SARS-CoV-2/FLU/RSV plus assay is intended as an aid in the diagnosis of influenza from Nasopharyngeal swab specimens and should not be used as a sole basis for treatment. Nasal washings and aspirates are unacceptable for Xpert Xpress SARS-CoV-2/FLU/RSV testing.  Fact Sheet for Patients: EntrepreneurPulse.com.au  Fact Sheet for Healthcare Providers: IncredibleEmployment.be  This test is not yet approved or cleared by the [place] FDA and has been authorized for detection and/or diagnosis of SARS-CoV-2 by FDA under an Emergency Use Authorization (EUA). This EUA will remain in effect (meaning this test can be used) for the duration of the COVID-19 declaration under Section 564(b)(1) of the Act, 21 U.S.C. section 360bbb-3(b)(1), unless the authorization is terminated or revoked.  Performed at [facility], [place] [address] [place], [place] [address]          Radiology Studies:  DG Chest 2 View  Result Date: 04/01/2021 CLINICAL DATA:  Cough and wheezing. EXAM: CHEST - 2 VIEW COMPARISON:  01/23/2021 FINDINGS: The heart size and mediastinal contours are within normal limits. Both lungs are clear. The visualized skeletal structures are unremarkable. IMPRESSION: No active cardiopulmonary disease. Electronically Signed   By: Kerby Moors M.D.   On: 04/01/2021 07:21        Scheduled Meds:  ALPRAZolam  0.5 mg Oral QHS   atorvastatin  40 mg Oral QHS   chlorthalidone  25 mg Oral Daily   diltiazem  240 mg Oral Daily   divalproex  500 mg Oral QHS   enoxaparin (LOVENOX) injection  40 mg Subcutaneous Q24H   hydrALAZINE  50 mg Oral BID   insulin  aspart  0-9 Units Subcutaneous TID WC   ipratropium-albuterol  3 mL Nebulization Q4H   memantine  10 mg Oral BID   methylPREDNISolone (SOLU-MEDROL) injection  120 mg Intravenous Q12H   nebivolol  10 mg Oral Daily   pantoprazole  40 mg Oral Daily   sodium chloride flush  3 mL Intravenous Q12H   Continuous Infusions:  albuterol       LOS: 0 days       Phillips Climes, MD [facility]   To contact the attending provider between 7A-7P or the covering provider during after hours 7P-7A, please log into the web site [email or web address] and access using universal [place] password for that web site. If you do not have the password, please call the hospital operator.  04/02/2021, 1:50 PM

## 2021-04-03 LAB — BASIC METABOLIC PANEL
Anion gap: 12 (ref 5–15)
BUN: 40 mg/dL — ABNORMAL HIGH (ref 8–23)
CO2: 22 mmol/L (ref 22–32)
Calcium: 9.3 mg/dL (ref 8.9–10.3)
Chloride: 102 mmol/L (ref 98–111)
Creatinine, Ser: 1.65 mg/dL — ABNORMAL HIGH (ref 0.44–1.00)
GFR, Estimated: 33 mL/min — ABNORMAL LOW (ref >60)
Glucose, Bld: 204 mg/dL — ABNORMAL HIGH (ref 70–99)
Potassium: 3.4 mmol/L — ABNORMAL LOW (ref 3.5–5.1)
Sodium: 136 mmol/L (ref 135–145)

## 2021-04-03 LAB — CBC
HCT: 35.8 % — ABNORMAL LOW (ref 36.0–46.0)
Hemoglobin: 11.5 g/dL — ABNORMAL LOW (ref 12.0–15.0)
MCH: 30.3 pg (ref 26.0–34.0)
MCHC: 32.1 g/dL (ref 30.0–36.0)
MCV: 94.2 fL (ref 80.0–100.0)
Platelets: 289 10*3/uL (ref 150–400)
RBC: 3.8 MIL/uL — ABNORMAL LOW (ref 3.87–5.11)
RDW: 13.6 % (ref 11.5–15.5)
WBC: 12.8 10*3/uL — ABNORMAL HIGH (ref 4.0–10.5)
nRBC: 0 % (ref 0.0–0.2)

## 2021-04-03 LAB — GLUCOSE, CAPILLARY
Glucose-Capillary: 203 mg/dL — ABNORMAL HIGH (ref 70–99)
Glucose-Capillary: 204 mg/dL — ABNORMAL HIGH (ref 70–99)
Glucose-Capillary: 194 mg/dL — ABNORMAL HIGH (ref 70–99)
Glucose-Capillary: 255 mg/dL — ABNORMAL HIGH (ref 70–99)

## 2021-04-03 MED ORDER — BUDESONIDE 0.5 MG/2ML IN SUSP
0.5000 mg | Freq: Two times a day (BID) | RESPIRATORY_TRACT | Status: DC
  Administered 2021-04-03 – 2021-04-09 (×13): 0.5 mg via RESPIRATORY_TRACT
  Filled 2021-04-03 (×14): qty 2

## 2021-04-03 MED ORDER — HYDROCODONE-ACETAMINOPHEN 5-325 MG PO TABS
1.0000 | ORAL_TABLET | ORAL | Status: DC | PRN
  Administered 2021-04-03 – 2021-04-09 (×15): 1 via ORAL
  Filled 2021-04-03 (×15): qty 1

## 2021-04-03 MED ORDER — ACETAMINOPHEN 650 MG RE SUPP: 650.0000 mg | Freq: Four times a day (QID) | RECTAL | Status: DC | PRN

## 2021-04-03 MED ORDER — FAMOTIDINE 20 MG PO TABS
20.0000 mg | ORAL_TABLET | Freq: Once | ORAL | Status: AC
Start: 2021-04-03 — End: 2021-04-03
  Administered 2021-04-03: 07:00:00 20 mg via ORAL
  Filled 2021-04-03: qty 1

## 2021-04-03 MED ORDER — POTASSIUM CHLORIDE CRYS ER 20 MEQ PO TBCR
40.0000 meq | EXTENDED_RELEASE_TABLET | Freq: Four times a day (QID) | ORAL | Status: AC
  Administered 2021-04-03 (×2): 40 meq via ORAL
  Filled 2021-04-03 (×2): qty 2

## 2021-04-03 MED ORDER — ACETAMINOPHEN 325 MG PO TABS: 325.0000 mg | ORAL_TABLET | Freq: Four times a day (QID) | ORAL | Status: DC | PRN

## 2021-04-04 LAB — RESPIRATORY PANEL BY PCR

## 2021-04-04 LAB — GLUCOSE, CAPILLARY
Glucose-Capillary: 251 mg/dL — ABNORMAL HIGH (ref 70–99)
Glucose-Capillary: 250 mg/dL — ABNORMAL HIGH (ref 70–99)
Glucose-Capillary: 240 mg/dL — ABNORMAL HIGH (ref 70–99)
Glucose-Capillary: 262 mg/dL — ABNORMAL HIGH (ref 70–99)

## 2021-04-04 MED ORDER — GUAIFENESIN ER 600 MG PO TB12
600.0000 mg | ORAL_TABLET | Freq: Two times a day (BID) | ORAL | Status: DC
  Administered 2021-04-04 – 2021-04-10 (×13): 600 mg via ORAL
  Filled 2021-04-04 (×13): qty 1

## 2021-04-04 NOTE — Evaluation (Signed)
Physical Therapy Evaluation and Discharge Patient Details Name: [name] MRN: [id number] DOB: 1950/10/15 Today's Date: 04/04/2021  History of Present Illness  70 y.o. female presented 04/01/21 for asthma flare.   PMH significant of asthma, CAD, CKD 3, CHF, depression, anxiety, diabetes, hypertension, GERD, hyperlipidemia, macrocytic anemia, pulmonary embolism, seizure  Clinical Impression   Patient evaluated by Physical Therapy with no further acute PT needs identified. All education has been completed and the patient has no further questions. Oxygen saturation stayed >94% on room air while ambulating, however did become more short of breath. PT is signing off. Thank you for this referral.        Recommendations for follow up therapy are one component of a multi-disciplinary discharge planning process, led by the attending physician.  Recommendations may be updated based on patient status, additional functional criteria and insurance authorization.  Follow Up Recommendations No PT follow up    Assistance Recommended at Discharge None  Functional Status Assessment Patient has not had a recent decline in their functional status  Equipment Recommendations  None recommended by PT    Recommendations for Other Services       Precautions / Restrictions Precautions Precautions: None      Mobility  Bed Mobility Overal bed mobility: Independent                  Transfers Overall transfer level: Independent                      Ambulation/Gait Ambulation/Gait assistance: Independent;Supervision Gait Distance (Feet): 200 Feet Assistive device: None Gait Pattern/deviations: WFL(Within Functional Limits)   Gait velocity interpretation: 1.31 - 2.62 ft/sec, indicative of limited community ambulator   General Gait Details: pt with slower than normal pace due to feeling short of breath; independent but as she fatigued needed cues to stop talking and to stop and take  standing rest  Stairs            Wheelchair Mobility    Modified Rankin (Stroke Patients Only)       Balance Overall balance assessment: Independent                                           Pertinent Vitals/Pain Pain Assessment: No/denies pain    Home Living Family/patient expects to be discharged to:: Private residence Living Arrangements: Spouse/significant other;Children;Other relatives (daughter and 2 grandchildren) Available Help at Discharge: Family;Available 24 hours/day Type of Home: House Home Access: Stairs to enter Entrance Stairs-Rails: Right;Left;Can reach both Entrance Stairs-Number of Steps: 3   Home Layout: One level Home Equipment: Counsellor (2 wheels)      Prior Function Prior Level of Function : Independent/Modified Independent;Driving             Mobility Comments: no devices needed       Hand Dominance        Extremity/Trunk Assessment   Upper Extremity Assessment Upper Extremity Assessment: Overall WFL for tasks assessed    Lower Extremity Assessment Lower Extremity Assessment: Overall WFL for tasks assessed    Cervical / Trunk Assessment Cervical / Trunk Assessment: Normal  Communication   Communication: No difficulties  Cognition Arousal/Alertness: Awake/alert Behavior During Therapy: WFL for tasks assessed/performed Overall Cognitive Status: Within Functional Limits for tasks assessed  General Comments General comments (skin integrity, edema, etc.): see Oxygen saturation note    Exercises     Assessment/Plan    PT Assessment Patient does not need any further PT services  PT Problem List         PT Treatment Interventions      PT Goals (Current goals can be found in the Care Plan section)  Acute Rehab PT Goals Patient Stated Goal: return home with breathing better PT Goal Formulation: All assessment and education  complete, DC therapy    Frequency     Barriers to discharge        Co-evaluation               AM-PAC PT "6 Clicks" Mobility  Outcome Measure Help needed turning from your back to your side while in a flat bed without using bedrails?: None Help needed moving from lying on your back to sitting on the side of a flat bed without using bedrails?: None Help needed moving to and from a bed to a chair (including a wheelchair)?: None Help needed standing up from a chair using your arms (e.g., wheelchair or bedside chair)?: None Help needed to walk in hospital room?: None Help needed climbing 3-5 steps with a railing? : None 6 Click Score: 24    End of Session   Activity Tolerance: Treatment limited secondary to medical complications (Comment) (limited by shortness of breath) Patient left: in chair;with call bell/phone within reach Nurse Communication: Mobility status;Other (comment) (incr SOB) PT Visit Diagnosis: Difficulty in walking, not elsewhere classified (R26.2)    Time: 9163-8466 PT Time Calculation (min) (ACUTE ONLY): 25 min   Charges:   PT Evaluation $PT Eval Low Complexity: 1 Low PT Treatments $Gait Training: 8-22 mins         Arby Barrette, PT Acute Rehabilitation Services  Pager [phone] Office [phone]   Rexanne Mano 04/04/2021, 12:17 PM

## 2021-04-04 NOTE — Progress Notes (Signed)
SATURATION QUALIFICATIONS: (This note is used to comply with regulatory documentation for home oxygen)  Patient Saturations on Room Air at Rest = 100%  Patient Saturations on Room Air while Ambulating = 94%  Patient Saturations on -- Liters of oxygen while Ambulating = --%   Patient does not require supplemental oxygen at this time.    Arby Barrette, PT Acute Rehabilitation Services  Pager [phone] Office [phone]

## 2021-04-04 NOTE — Progress Notes (Signed)
PROGRESS NOTE    [name]  MVH:[id number] DOB: 1950-08-17 DOA: 04/01/2021 PCP: [name], MD   Chief Complaint  Patient presents with   Asthma    Brief Narrative:    [name] is a 70 y.o. female with medical history significant of asthma, CAD, CKD 3, CHF, depression, anxiety, diabetes, hypertension, GERD, hyperlipidemia, macrocytic anemia, pulmonary embolism, seizure presenting for asthma flare.  Assessment & Plan:   Principal Problem:   Asthma exacerbation Active Problems:   Depression with anxiety   Essential hypertension   G E R D   Diabetes mellitus type 2, controlled (HCC)   CAD in native artery   Seizures (HCC)   HLD (hyperlipidemia)   Chronic diastolic CHF (congestive heart failure) (HCC)   Macrocytic anemia   CKD (chronic kidney disease), stage III (HCC)   Asthma exacerbation -  Presenting with 4 to 5 days of asthma exacerbation.  Unable to be controlled with home inhalers or nebulizers.  She had called her pulmonologist office who would recommend increasing the dose of her now but this has not helped. -She does not respond well initially to oral steroids and scheduled duo nebs, so she is transition to IV Solu-Medrol, she remains with significant wheezing, diminished air entry, as well as significantly dyspneic with activity, will keep on current dose steroids with no tapering yet.  . -Continue with scheduled duo nebs. -Follow on final results of respiratory viral panel, as her exacerbation most likely provoked by a viral illness. -Continue home Singulair -He was encouraged to use incentive spirometer  -As well she sounds with upper airway congestion, so I have encouraged her to use flutter valve as well, will start her on Mucinex.   Hypokalemia -Repleted, monitor closely  CKD 3 -At baseline, avoid nephrotoxic medications  Chronic diastolic CHHF - Last echo in 2019 with EF 65-70% with G2 DD.  Not on any diuretics.  He appears  euvolemic  Hypertension - - Continue home chlorthalidone, diltiazem, hydralazine, nebivolol  CAD,  - History of CAD with NSTEMI last cath in 2017 at the time of event with multiple lesions of the RCA which were stented.  HLD -  Continue home atorvastatin   Diabetes - SSI   Depression Anxiety - Continue home Xanax    GERD - Continue home PPI  Anemia > Hemoglobin stable at 1.9 - Continue to trend CBC   Seizures - Continue home Depakote   DVT prophylaxis: Lovenox Code Status: Full Family Communication: none at bedside Disposition:   Status is: Inpatient  Remains inpatient appropriate because: Remains with significant wheezing and diminished air entry       Consultants:  None   Subjective:  Reports her cough has improved, as well her dyspnea, but dyspnea remains significant even with minimal activity. Objective: Vitals:   04/04/21 0751 04/04/21 0800 04/04/21 1131 04/04/21 1158  BP:  (!) 193/83  (!) 175/78  Pulse: 81 84 78 83  Resp: 18 18 18 19   Temp:  97.7 F (36.5 C)  97.8 F (36.6 C)  TempSrc:  Oral  Oral  SpO2: 98% 99% 100% 98%  Weight:      Height:        Intake/Output Summary (Last 24 hours) at 04/04/2021 1449 Last data filed at 04/04/2021 0700 Gross per 24 hour  Intake 366 ml  Output 550 ml  Net -184 ml   Filed Weights   04/04/21 0500 04/04/21 0600  Weight: 72.6 kg 73.5 kg    Examination:  Awake Alert, Oriented X 3, No new F.N deficits, Normal affect Symmetrical Chest wall movement, remains with significant wheezing and diminished air entry bilaterally. RRR,No Gallops,Rubs or new Murmurs, No Parasternal Heave +ve B.Sounds, Abd Soft, No tenderness, No rebound - guarding or rigidity. No Cyanosis, Clubbing or edema, No new Rash or bruise       Data Reviewed: I have personally reviewed following labs and imaging studies  CBC: Recent Labs  Lab 04/01/21 0745 04/02/21 0546 04/03/21 0044  WBC 6.6 7.8 12.8*  NEUTROABS 3.5  --   --    HGB 11.9* 11.8* 11.5*  HCT 36.4 35.4* 35.8*  MCV 95.5 93.4 94.2  PLT 269 266 878    Basic Metabolic Panel: Recent Labs  Lab 04/01/21 0745 04/02/21 0546 04/03/21 0044  NA 138 139 136  K 3.3* 4.1 3.4*  CL 102 105 102  CO2 26 22 22   GLUCOSE 132* 168* 204*  BUN 19 24* 40*  CREATININE 1.45* 1.51* 1.65*  CALCIUM 9.3 9.5 9.3    GFR: Estimated Creatinine Clearance: 28.3 mL/min (A) (by C-G formula based on SCr of 1.65 mg/dL (H)).  Liver Function Tests: No results for input(s): AST, ALT, ALKPHOS, BILITOT, PROT, ALBUMIN in the last 168 hours.  CBG: Recent Labs  Lab 04/03/21 1216 04/03/21 1557 04/03/21 2212 04/04/21 0758 04/04/21 1154  GLUCAP 204* 194* 255* 251* 250*     Recent Results (from the past 240 hour(s))  Resp Panel by RT-PCR (Flu A&B, Covid) Nasopharyngeal Swab     Status: None   Collection Time: 04/01/21  4:19 PM   Specimen: Nasopharyngeal Swab; Nasopharyngeal(NP) swabs in vial transport medium  Result Value Ref Range Status   SARS Coronavirus 2 by RT PCR NEGATIVE NEGATIVE Final    Comment: (NOTE) SARS-CoV-2 target nucleic acids are NOT DETECTED.  The SARS-CoV-2 RNA is generally detectable in upper respiratory specimens during the acute phase of infection. The lowest concentration of SARS-CoV-2 viral copies this assay can detect is 138 copies/mL. A negative result does not preclude SARS-Cov-2 infection and should not be used as the sole basis for treatment or other patient management decisions. A negative result may occur with  improper specimen collection/handling, submission of specimen other than nasopharyngeal swab, presence of viral mutation(s) within the areas targeted by this assay, and inadequate number of viral copies(<138 copies/mL). A negative result must be combined with clinical observations, patient history, and epidemiological information. The expected result is Negative.  Fact Sheet for Patients:   EntrepreneurPulse.com.au  Fact Sheet for Healthcare Providers:  IncredibleEmployment.be  This test is no t yet approved or cleared by the [place] FDA and  has been authorized for detection and/or diagnosis of SARS-CoV-2 by FDA under an Emergency Use Authorization (EUA). This EUA will remain  in effect (meaning this test can be used) for the duration of the COVID-19 declaration under Section 564(b)(1) of the Act, 21 U.S.C.section 360bbb-3(b)(1), unless the authorization is terminated  or revoked sooner.       Influenza A by PCR NEGATIVE NEGATIVE Final   Influenza B by PCR NEGATIVE NEGATIVE Final    Comment: (NOTE) The Xpert Xpress SARS-CoV-2/FLU/RSV plus assay is intended as an aid in the diagnosis of influenza from Nasopharyngeal swab specimens and should not be used as a sole basis for treatment. Nasal washings and aspirates are unacceptable for Xpert Xpress SARS-CoV-2/FLU/RSV testing.  Fact Sheet for Patients: EntrepreneurPulse.com.au  Fact Sheet for Healthcare Providers: IncredibleEmployment.be  This test is not yet approved or cleared by  the [employer] and has been authorized for detection and/or diagnosis of SARS-CoV-2 by FDA under an Emergency Use Authorization (EUA). This EUA will remain in effect (meaning this test can be used) for the duration of the COVID-19 declaration under Section 564(b)(1) of the Act, 21 U.S.C. section 360bbb-3(b)(1), unless the authorization is terminated or revoked.  Performed at [facility], [place] [address], [place], [place] [address]          Radiology Studies: No results found.      Scheduled Meds:  ALPRAZolam  0.5 mg Oral QHS   atorvastatin  40 mg Oral QHS   budesonide (PULMICORT) nebulizer solution  0.5 mg Nebulization BID   chlorthalidone  25 mg Oral Daily   diltiazem  240 mg Oral Daily   divalproex  500 mg Oral QHS   enoxaparin  (LOVENOX) injection  40 mg Subcutaneous Q24H   hydrALAZINE  50 mg Oral BID   insulin aspart  0-9 Units Subcutaneous TID WC & HS   ipratropium-albuterol  3 mL Nebulization Q4H   memantine  10 mg Oral BID   methylPREDNISolone (SOLU-MEDROL) injection  120 mg Intravenous Q12H   nebivolol  10 mg Oral Daily   pantoprazole  40 mg Oral Daily   sodium chloride flush  3 mL Intravenous Q12H   Continuous Infusions:  albuterol       LOS: 2 days       Phillips Climes, MD [facility]   To contact the attending provider between 7A-7P or the covering provider during after hours 7P-7A, please log into the web site [email or web address] and access using universal [place] password for that web site. If you do not have the password, please call the hospital operator.  04/04/2021, 2:49 PM

## 2021-04-05 ENCOUNTER — Encounter (HOSPITAL_COMMUNITY): Payer: Self-pay | Admitting: Internal Medicine

## 2021-04-05 ENCOUNTER — Encounter: Payer: Medicare Other | Admitting: Physical Therapy

## 2021-04-05 LAB — CBC
HCT: 32 % — ABNORMAL LOW (ref 36.0–46.0)
Hemoglobin: 10.5 g/dL — ABNORMAL LOW (ref 12.0–15.0)
MCH: 31.2 pg (ref 26.0–34.0)
MCHC: 32.8 g/dL (ref 30.0–36.0)
MCV: 95 fL (ref 80.0–100.0)
Platelets: 260 10*3/uL (ref 150–400)
RBC: 3.37 MIL/uL — ABNORMAL LOW (ref 3.87–5.11)
RDW: 13.6 % (ref 11.5–15.5)
WBC: 13.6 10*3/uL — ABNORMAL HIGH (ref 4.0–10.5)
nRBC: 0 % (ref 0.0–0.2)

## 2021-04-05 LAB — BASIC METABOLIC PANEL
Anion gap: 11 (ref 5–15)
BUN: 46 mg/dL — ABNORMAL HIGH (ref 8–23)
CO2: 22 mmol/L (ref 22–32)
Calcium: 8.8 mg/dL — ABNORMAL LOW (ref 8.9–10.3)
Chloride: 96 mmol/L — ABNORMAL LOW (ref 98–111)
Creatinine, Ser: 1.5 mg/dL — ABNORMAL HIGH (ref 0.44–1.00)
GFR, Estimated: 37 mL/min — ABNORMAL LOW (ref >60)
Glucose, Bld: 309 mg/dL — ABNORMAL HIGH (ref 70–99)
Potassium: 4.1 mmol/L (ref 3.5–5.1)
Sodium: 129 mmol/L — ABNORMAL LOW (ref 135–145)

## 2021-04-05 LAB — GLUCOSE, CAPILLARY
Glucose-Capillary: 274 mg/dL — ABNORMAL HIGH (ref 70–99)
Glucose-Capillary: 357 mg/dL — ABNORMAL HIGH (ref 70–99)
Glucose-Capillary: 233 mg/dL — ABNORMAL HIGH (ref 70–99)

## 2021-04-05 LAB — HEMOGLOBIN A1C
Hgb A1c MFr Bld: 6.8 % — ABNORMAL HIGH (ref 4.8–5.6)
Mean Plasma Glucose: 148.46 mg/dL

## 2021-04-05 MED ORDER — INSULIN GLARGINE-YFGN 100 UNIT/ML ~~LOC~~ SOLN
5.0000 [IU] | Freq: Every day | SUBCUTANEOUS | Status: DC
  Administered 2021-04-05 – 2021-04-06 (×2): 5 [IU] via SUBCUTANEOUS
  Filled 2021-04-05 (×2): qty 0.05

## 2021-04-05 MED ORDER — METHYLPREDNISOLONE SODIUM SUCC 40 MG IJ SOLR
40.0000 mg | Freq: Two times a day (BID) | INTRAMUSCULAR | Status: DC
  Administered 2021-04-05 – 2021-04-08 (×7): 40 mg via INTRAVENOUS
  Filled 2021-04-05 (×7): qty 1

## 2021-04-05 NOTE — Progress Notes (Addendum)
PROGRESS NOTE    [name]  TTS:[id number] DOB: [birth year] DOA: 04/01/2021 PCP: [name], MD   Chief Complaint  Patient presents with   Asthma    Brief Narrative:    [name] is a 70 y.o. female with medical history significant of asthma, CAD, CKD 3, CHF, depression, anxiety, diabetes, hypertension, GERD, hyperlipidemia, macrocytic anemia, pulmonary embolism, seizure presenting for asthma flare.  Assessment & Plan:   Principal Problem:   Asthma exacerbation Active Problems:   Depression with anxiety   Essential hypertension   G E R D   Diabetes mellitus type 2, controlled (HCC)   CAD in native artery   Seizures (HCC)   HLD (hyperlipidemia)   Chronic diastolic CHF (congestive heart failure) (HCC)   Macrocytic anemia   CKD (chronic kidney disease), stage III (HCC)   Asthma exacerbation -  Presenting with 4 to 5 days of asthma exacerbation.  Unable to be controlled with home inhalers or nebulizers.  She had called her pulmonologist office who would recommend increasing the dose of her now but this has not helped. -She does not respond well initially to oral steroids and scheduled duo nebs, so she is transition to IV Solu-Medrol, she remains with significant wheezing, diminished air entry, as well as significantly dyspneic with activity.. -Continue with scheduled duo nebs.  I will decrease her dose 120 mg to 40 mg IV twice daily, given her CBG started to increase . -Respiratory panel growing metapneumovirus -Continue home Singulair -He was encouraged to use incentive spirometer  -As well she sounds with upper airway congestion, so I have encouraged her to use flutter valve as well, will start her on Mucinex.   Hypokalemia -Repleted, monitor closely  CKD 3 -At baseline, avoid nephrotoxic medications  Chronic diastolic CHHF - Last echo in 2019 with EF 65-70% with G2 DD.  Not on any diuretics.  He appears euvolemic  Hypertension - - Continue home  chlorthalidone, diltiazem, hydralazine, nebivolol  CAD,  - History of CAD with NSTEMI last cath in 2017 at the time of event with multiple lesions of the RCA which were stented.  HLD -  Continue home atorvastatin   Diabetes -CBG started to increase, most likely due to steroids despite being on insulin sliding scale, I will decrease her steroid and add low-dose Semglee   Depression Anxiety - Continue home Xanax    GERD - Continue home PPI  Anemia > Hemoglobin stable at 1.9 - Continue to trend CBC   Seizures - Continue home Depakote   DVT prophylaxis: Lovenox Code Status: Full Family Communication: none at bedside Disposition:   Status is: Inpatient  Remains inpatient appropriate because: Remains with significant wheezing and diminished air entry       Consultants:  None   Subjective:  Reports dyspnea with activity,  Objective: Vitals:   04/05/21 0804 04/05/21 0902 04/05/21 1107 04/05/21 1143  BP: (!) 164/82   (!) 162/79  Pulse: 81   84  Resp: (!) 22   14  Temp: 98.2 F (36.8 C)   98.5 F (36.9 C)  TempSrc: Oral   Oral  SpO2: 100% 100% 100% 99%  Weight:      Height:        Intake/Output Summary (Last 24 hours) at 04/05/2021 1500 Last data filed at 04/05/2021 0000 Gross per 24 hour  Intake 120 ml  Output --  Net 120 ml   Filed Weights   04/04/21 0500 04/04/21 0600  Weight: 72.6 kg 73.5  kg    Examination:  Awake Alert, Oriented X 3, No new F.N deficits, Normal affect Symmetrical Chest wall movement, she remains with significant wheezing bilaterally RRR,No Gallops,Rubs or new Murmurs, No Parasternal Heave +ve B.Sounds, Abd Soft, No tenderness, No rebound - guarding or rigidity. No Cyanosis, Clubbing or edema, No new Rash or bruise        Data Reviewed: I have personally reviewed following labs and imaging studies  CBC: Recent Labs  Lab 04/01/21 0745 04/02/21 0546 04/03/21 0044 04/05/21 0022  WBC 6.6 7.8 12.8* 13.6*  NEUTROABS  3.5  --   --   --   HGB 11.9* 11.8* 11.5* 10.5*  HCT 36.4 35.4* 35.8* 32.0*  MCV 95.5 93.4 94.2 95.0  PLT 269 266 289 810    Basic Metabolic Panel: Recent Labs  Lab 04/01/21 0745 04/02/21 0546 04/03/21 0044 04/05/21 0022  NA 138 139 136 129*  K 3.3* 4.1 3.4* 4.1  CL 102 105 102 96*  CO2 26 22 22 22   GLUCOSE 132* 168* 204* 309*  BUN 19 24* 40* 46*  CREATININE 1.45* 1.51* 1.65* 1.50*  CALCIUM 9.3 9.5 9.3 8.8*    GFR: Estimated Creatinine Clearance: 31.1 mL/min (A) (by C-G formula based on SCr of 1.5 mg/dL (H)).  Liver Function Tests: No results for input(s): AST, ALT, ALKPHOS, BILITOT, PROT, ALBUMIN in the last 168 hours.  CBG: Recent Labs  Lab 04/04/21 1154 04/04/21 1603 04/04/21 2021 04/05/21 0806 04/05/21 1148  GLUCAP 250* 240* 262* 274* 357*     Recent Results (from the past 240 hour(s))  Resp Panel by RT-PCR (Flu A&B, Covid) Nasopharyngeal Swab     Status: None   Collection Time: 04/01/21  4:19 PM   Specimen: Nasopharyngeal Swab; Nasopharyngeal(NP) swabs in vial transport medium  Result Value Ref Range Status   SARS Coronavirus 2 by RT PCR NEGATIVE NEGATIVE Final    Comment: (NOTE) SARS-CoV-2 target nucleic acids are NOT DETECTED.  The SARS-CoV-2 RNA is generally detectable in upper respiratory specimens during the acute phase of infection. The lowest concentration of SARS-CoV-2 viral copies this assay can detect is 138 copies/mL. A negative result does not preclude SARS-Cov-2 infection and should not be used as the sole basis for treatment or other patient management decisions. A negative result may occur with  improper specimen collection/handling, submission of specimen other than nasopharyngeal swab, presence of viral mutation(s) within the areas targeted by this assay, and inadequate number of viral copies(<138 copies/mL). A negative result must be combined with clinical observations, patient history, and epidemiological information. The expected  result is Negative.  Fact Sheet for Patients:  EntrepreneurPulse.com.au  Fact Sheet for Healthcare Providers:  IncredibleEmployment.be  This test is no t yet approved or cleared by the [place] FDA and  has been authorized for detection and/or diagnosis of SARS-CoV-2 by FDA under an Emergency Use Authorization (EUA). This EUA will remain  in effect (meaning this test can be used) for the duration of the COVID-19 declaration under Section 564(b)(1) of the Act, 21 U.S.C.section 360bbb-3(b)(1), unless the authorization is terminated  or revoked sooner.       Influenza A by PCR NEGATIVE NEGATIVE Final   Influenza B by PCR NEGATIVE NEGATIVE Final    Comment: (NOTE) The Xpert Xpress SARS-CoV-2/FLU/RSV plus assay is intended as an aid in the diagnosis of influenza from Nasopharyngeal swab specimens and should not be used as a sole basis for treatment. Nasal washings and aspirates are unacceptable for Xpert Xpress SARS-CoV-2/FLU/RSV  testing.  Fact Sheet for Patients: EntrepreneurPulse.com.au  Fact Sheet for Healthcare Providers: IncredibleEmployment.be  This test is not yet approved or cleared by the [place] FDA and has been authorized for detection and/or diagnosis of SARS-CoV-2 by FDA under an Emergency Use Authorization (EUA). This EUA will remain in effect (meaning this test can be used) for the duration of the COVID-19 declaration under Section 564(b)(1) of the Act, 21 U.S.C. section 360bbb-3(b)(1), unless the authorization is terminated or revoked.  Performed at [facility], [place] [address], [place], [place] [address]   Respiratory (~20 pathogens) panel by PCR     Status: Abnormal   Collection Time: 04/01/21  4:19 PM   Specimen: Nasopharyngeal Swab; Respiratory  Result Value Ref Range Status   Adenovirus NOT DETECTED NOT DETECTED Final   Coronavirus 229E NOT DETECTED NOT DETECTED  Final    Comment: (NOTE) The Coronavirus on the Respiratory Panel, DOES NOT test for the novel  Coronavirus (2019 nCoV)    Coronavirus HKU1 NOT DETECTED NOT DETECTED Final   Coronavirus NL63 NOT DETECTED NOT DETECTED Final   Coronavirus OC43 NOT DETECTED NOT DETECTED Final   Metapneumovirus DETECTED (A) NOT DETECTED Final   Rhinovirus / Enterovirus NOT DETECTED NOT DETECTED Final   Influenza A NOT DETECTED NOT DETECTED Final   Influenza B NOT DETECTED NOT DETECTED Final   Parainfluenza Virus 1 NOT DETECTED NOT DETECTED Final   Parainfluenza Virus 2 NOT DETECTED NOT DETECTED Final   Parainfluenza Virus 3 NOT DETECTED NOT DETECTED Final   Parainfluenza Virus 4 NOT DETECTED NOT DETECTED Final   Respiratory Syncytial Virus NOT DETECTED NOT DETECTED Final   Bordetella pertussis NOT DETECTED NOT DETECTED Final   Bordetella Parapertussis NOT DETECTED NOT DETECTED Final   Chlamydophila pneumoniae NOT DETECTED NOT DETECTED Final   Mycoplasma pneumoniae NOT DETECTED NOT DETECTED Final    Comment: Performed at [facility], [place]. [address], [place], [place] [address]         Radiology Studies: No results found.      Scheduled Meds:  ALPRAZolam  0.5 mg Oral QHS   atorvastatin  40 mg Oral QHS   budesonide (PULMICORT) nebulizer solution  0.5 mg Nebulization BID   chlorthalidone  25 mg Oral Daily   diltiazem  240 mg Oral Daily   divalproex  500 mg Oral QHS   enoxaparin (LOVENOX) injection  40 mg Subcutaneous Q24H   guaiFENesin  600 mg Oral BID   hydrALAZINE  50 mg Oral BID   insulin aspart  0-9 Units Subcutaneous TID WC & HS   insulin glargine-yfgn  5 Units Subcutaneous Daily   ipratropium-albuterol  3 mL Nebulization Q4H   memantine  10 mg Oral BID   methylPREDNISolone (SOLU-MEDROL) injection  120 mg Intravenous Q12H   nebivolol  10 mg Oral Daily   pantoprazole  40 mg Oral Daily   sodium chloride flush  3 mL Intravenous Q12H   Continuous Infusions:  albuterol        LOS: 3 days       Phillips Climes, MD [facility]   To contact the attending provider between 7A-7P or the covering provider during after hours 7P-7A, please log into the web site [email or web address] and access using universal [place] password for that web site. If you do not have the password, please call the hospital operator.  04/05/2021, 3:00 PM

## 2021-04-05 NOTE — Telephone Encounter (Signed)
Spoke with the pt She states admitted to the hospital with asthma flare  Sending this to MW as Juluis Rainier

## 2021-04-05 NOTE — Progress Notes (Signed)
Inpatient Diabetes Program Recommendations  AACE/ADA: New Consensus Statement on Inpatient Glycemic Control (2015)  Target Ranges:  Prepandial:   less than 140 mg/dL      Peak postprandial:   less than 180 mg/dL (1-2 hours)      Critically ill patients:  140 - 180 mg/dL   Lab Results  Component Value Date   GLUCAP 274 (H) 04/05/2021   HGBA1C 6.8 (H) 04/05/2021    Review of Glycemic Control  Latest Reference Range & Units 04/04/21 16:03 04/04/21 20:21 04/05/21 08:06  Glucose-Capillary 70 - 99 mg/dL 240 (H) 262 (H) 274 (H)  (H): Data is abnormally high Diabetes history:  Type 2 DM Outpatient Diabetes medications: Metformin 500 mg bid Current orders for Inpatient glycemic control: Novolog 0-9 units TID & HS, Semglee 5 units qd Solumedrol 120 mg BID  Inpatient Diabetes Program Recommendations:    In the setting of steroids, consider adding Novolog 3 units TID (assuming patient consuming >50% of meals).   Thanks, Bronson Curb, MSN, RNC-OB Diabetes Coordinator [phone] (8a-5p)

## 2021-04-05 NOTE — Plan of Care (Signed)
  Problem: Education: Goal: Knowledge of General Education information will improve Description: Including pain rating scale, medication(s)/side effects and non-pharmacologic comfort measures Outcome: Progressing   Problem: Health Behavior/Discharge Planning: Goal: Ability to manage health-related needs will improve Outcome: Progressing   Problem: Clinical Measurements: Goal: Ability to maintain clinical measurements within normal limits will improve Outcome: Progressing Goal: Will remain free from infection Outcome: Progressing Goal: Diagnostic test results will improve Outcome: Progressing Goal: Respiratory complications will improve Outcome: Progressing Goal: Cardiovascular complication will be avoided Outcome: Progressing   Problem: Activity: Goal: Risk for activity intolerance will decrease Outcome: Progressing   Problem: Nutrition: Goal: Adequate nutrition will be maintained Outcome: Progressing   Problem: Elimination: Goal: Will not experience complications related to bowel motility Outcome: Progressing Goal: Will not experience complications related to urinary retention Outcome: Progressing   Problem: Coping: Goal: Level of anxiety will decrease Outcome: Progressing   Problem: Safety: Goal: Ability to remain free from injury will improve Outcome: Progressing   Problem: Education: Goal: Ability to demonstrate management of disease process will improve Outcome: Progressing Goal: Ability to verbalize understanding of medication therapies will improve Outcome: Progressing Goal: Individualized Educational Video(s) Outcome: Progressing   Problem: Activity: Goal: Capacity to carry out activities will improve Outcome: Progressing

## 2021-04-06 LAB — BASIC METABOLIC PANEL WITH GFR
Anion gap: 12 (ref 5–15)
BUN: 43 mg/dL — ABNORMAL HIGH (ref 8–23)
CO2: 25 mmol/L (ref 22–32)
Calcium: 8.6 mg/dL — ABNORMAL LOW (ref 8.9–10.3)
Chloride: 92 mmol/L — ABNORMAL LOW (ref 98–111)
Creatinine, Ser: 1.48 mg/dL — ABNORMAL HIGH (ref 0.44–1.00)
GFR, Estimated: 37 mL/min — ABNORMAL LOW
Glucose, Bld: 396 mg/dL — ABNORMAL HIGH (ref 70–99)
Potassium: 3.6 mmol/L (ref 3.5–5.1)
Sodium: 129 mmol/L — ABNORMAL LOW (ref 135–145)

## 2021-04-06 LAB — CBC
HCT: 32.2 % — ABNORMAL LOW (ref 36.0–46.0)
Hemoglobin: 10.9 g/dL — ABNORMAL LOW (ref 12.0–15.0)
MCH: 31.1 pg (ref 26.0–34.0)
MCHC: 33.9 g/dL (ref 30.0–36.0)
MCV: 91.7 fL (ref 80.0–100.0)
Platelets: 248 K/uL (ref 150–400)
RBC: 3.51 MIL/uL — ABNORMAL LOW (ref 3.87–5.11)
RDW: 13.2 % (ref 11.5–15.5)
WBC: 11 K/uL — ABNORMAL HIGH (ref 4.0–10.5)
nRBC: 0.2 % (ref 0.0–0.2)

## 2021-04-06 LAB — GLUCOSE, CAPILLARY
Glucose-Capillary: 276 mg/dL — ABNORMAL HIGH (ref 70–99)
Glucose-Capillary: 300 mg/dL — ABNORMAL HIGH (ref 70–99)
Glucose-Capillary: 294 mg/dL — ABNORMAL HIGH (ref 70–99)
Glucose-Capillary: 386 mg/dL — ABNORMAL HIGH (ref 70–99)

## 2021-04-06 MED ORDER — FLUTICASONE PROPIONATE 50 MCG/ACT NA SUSP
2.0000 | Freq: Every day | NASAL | Status: DC
  Administered 2021-04-06 – 2021-04-08 (×2): 2 via NASAL
  Filled 2021-04-06: qty 16

## 2021-04-06 MED ORDER — INSULIN GLARGINE-YFGN 100 UNIT/ML ~~LOC~~ SOLN
12.0000 [IU] | Freq: Every day | SUBCUTANEOUS | Status: DC
  Administered 2021-04-07 – 2021-04-08 (×2): 12 [IU] via SUBCUTANEOUS
  Filled 2021-04-06 (×2): qty 0.12

## 2021-04-06 MED ORDER — HYDRALAZINE HCL 50 MG PO TABS
50.0000 mg | ORAL_TABLET | Freq: Three times a day (TID) | ORAL | Status: DC
  Administered 2021-04-06 – 2021-04-07 (×3): 50 mg via ORAL
  Filled 2021-04-06 (×3): qty 1

## 2021-04-06 MED ORDER — INSULIN ASPART 100 UNIT/ML IJ SOLN
4.0000 [IU] | Freq: Three times a day (TID) | INTRAMUSCULAR | Status: DC
  Administered 2021-04-06 – 2021-04-07 (×3): 4 [IU] via SUBCUTANEOUS

## 2021-04-06 MED ORDER — FUROSEMIDE 40 MG PO TABS
40.0000 mg | ORAL_TABLET | Freq: Every day | ORAL | Status: DC
  Administered 2021-04-06: 40 mg via ORAL
  Filled 2021-04-06: qty 1

## 2021-04-06 MED ORDER — INSULIN ASPART 100 UNIT/ML IJ SOLN
0.0000 [IU] | Freq: Three times a day (TID) | INTRAMUSCULAR | Status: DC
Start: 2021-04-06 — End: 2021-04-10
  Administered 2021-04-06 – 2021-04-07 (×3): 8 [IU] via SUBCUTANEOUS
  Administered 2021-04-07: 15 [IU] via SUBCUTANEOUS
  Administered 2021-04-07 – 2021-04-08 (×2): 11 [IU] via SUBCUTANEOUS
  Administered 2021-04-08 (×2): 5 [IU] via SUBCUTANEOUS
  Administered 2021-04-09: 8 [IU] via SUBCUTANEOUS
  Administered 2021-04-09: 5 [IU] via SUBCUTANEOUS
  Administered 2021-04-09: 15 [IU] via SUBCUTANEOUS
  Administered 2021-04-10: 8 [IU] via SUBCUTANEOUS

## 2021-04-06 MED ORDER — LORATADINE 10 MG PO TABS
10.0000 mg | ORAL_TABLET | Freq: Every day | ORAL | Status: DC
  Administered 2021-04-06 – 2021-04-10 (×5): 10 mg via ORAL
  Filled 2021-04-06 (×5): qty 1

## 2021-04-06 MED ORDER — HYDROCOD POLST-CPM POLST ER 10-8 MG/5ML PO SUER
5.0000 mL | Freq: Two times a day (BID) | ORAL | Status: DC | PRN
  Administered 2021-04-07 – 2021-04-10 (×3): 5 mL via ORAL
  Filled 2021-04-06 (×3): qty 5

## 2021-04-06 MED ORDER — OXYMETAZOLINE HCL 0.05 % NA SOLN
1.0000 | Freq: Two times a day (BID) | NASAL | Status: AC
  Administered 2021-04-06 – 2021-04-08 (×5): 1 via NASAL
  Filled 2021-04-06: qty 30

## 2021-04-06 MED ORDER — BENZONATATE 100 MG PO CAPS
200.0000 mg | ORAL_CAPSULE | Freq: Three times a day (TID) | ORAL | Status: DC
  Administered 2021-04-06 – 2021-04-10 (×12): 200 mg via ORAL
  Filled 2021-04-06 (×12): qty 2

## 2021-04-06 NOTE — Progress Notes (Addendum)
PROGRESS NOTE        PATIENT DETAILS Name: [name] Age: 70 y.o. Sex: female Date of Birth: 1950/12/08 Admit Date: 04/01/2021 Admitting Physician Marcelyn Bruins, MD [id number], [name], MD  Brief Narrative: Patient is a 70 y.o. female with history of CKD stage IIIa, HFpEF, HTN, CAD-s/p PCI 2017, HLD, DM-2, depression/anxiety, seizure disorder-who presented with asthma exacerbation in the setting of metapneumovirus infection.  Subjective: Continues to have coughing spells-still wheezing.  Not yet at baseline.  Objective: Vitals: Blood pressure (!) 190/82, pulse 72, temperature 98.4 F (36.9 C), temperature source Oral, resp. rate 19, height 5\' 1"  (1.549 m), weight 73.5 kg, SpO2 95 %.   Exam: Gen Exam:Alert awake-not in any distress HEENT:atraumatic, normocephalic Chest: Coarse wheezing all over. CVS:S1S2 regular Abdomen:soft non tender, non distended Extremities:no edema Neurology: Non focal Skin: no rash  Pertinent Labs/Radiology: Recent Labs  Lab 04/06/21 0052  WBC 11.0*  HGB 10.9*  PLT 248  NA 129*  K 3.6  CREATININE 1.48*     Assessment/Plan: Asthma exacerbation likely provoked by metapneumovirus infection: Still wheezing short of breath with exertion-having coughing spells-although improved not yet at baseline.  Continues to have some nasal congestion.  Continue IV steroids/bronchodilators.  We will start Claritin/Flonase/Afrin-encourage use of incentive spirometry.  HTN: BP remains uncontrolled-change hydralazine to 3 times daily dosing-continue bisoprolol and Cardizem.  HFpEF: Euvolemic on exam-but due to persistent wheezing-we will start Lasix.  Stop chlorthalidone.  Hyponatremia: Mild-probably due to chlorthalidone-switching to Lasix.  DM-2 (A1c 6.8 on 12/6) with uncontrolled hyperglycemia due to steroid use: Increase Semglee to 12 units, add 4 units of NovoLog with meals continue SSI-and follow.  Recent Labs     04/05/21 1148 04/05/21 1559 04/06/21 0738  GLUCAP 357* 233* 276*    GERD: Continue PPI  CKD stage IIIa: Creatinine close to baseline-watch closely.  HLD: Continue statin  CAD s/p PCI 2017: No anginal symptoms  Seizure disorder: Continue valproic acid.  Obesity Estimated body mass index is 30.62 kg/m as calculated from the following:   Height as of this encounter: 5\' 1"  (1.549 m).   Weight as of this encounter: 73.5 kg.     Procedures: None Consults: None DVT Prophylaxis: Lovenox Code Status:Full code  Family Communication: None at bedside  Time spent: 35 minutes-Greater than 50% of this time was spent in counseling, explanation of diagnosis, planning of further management, and coordination of care.   Disposition Plan: Status is: Inpatient  Remains inpatient appropriate because: Asthma exacerbation-still wheezing/short of breath-not yet at baseline.    Diet: Diet Order             Diet Carb Modified Fluid consistency: Thin; Room service appropriate? Yes  Diet effective now                     Antimicrobial agents: Anti-infectives (From admission, onward)    None        MEDICATIONS: Scheduled Meds:  ALPRAZolam  0.5 mg Oral QHS   atorvastatin  40 mg Oral QHS   benzonatate  200 mg Oral TID   budesonide (PULMICORT) nebulizer solution  0.5 mg Nebulization BID   diltiazem  240 mg Oral Daily   divalproex  500 mg Oral QHS   enoxaparin (LOVENOX) injection  40 mg Subcutaneous Q24H   fluticasone  2 spray Each Nare Daily  furosemide  40 mg Oral Daily   guaiFENesin  600 mg Oral BID   hydrALAZINE  50 mg Oral BID   insulin aspart  0-9 Units Subcutaneous TID WC & HS   insulin glargine-yfgn  5 Units Subcutaneous Daily   ipratropium-albuterol  3 mL Nebulization Q4H   loratadine  10 mg Oral Daily   memantine  10 mg Oral BID   methylPREDNISolone (SOLU-MEDROL) injection  40 mg Intravenous Q12H   nebivolol  10 mg Oral Daily   oxymetazoline  1 spray Each  Nare BID   pantoprazole  40 mg Oral Daily   sodium chloride flush  3 mL Intravenous Q12H   Continuous Infusions:  albuterol     PRN Meds:.acetaminophen **OR** acetaminophen, albuterol, chlorpheniramine-HYDROcodone, guaiFENesin-dextromethorphan, HYDROcodone-acetaminophen, polyethylene glycol   I have personally reviewed following labs and imaging studies  LABORATORY DATA: CBC: Recent Labs  Lab 04/01/21 0745 04/02/21 0546 04/03/21 0044 04/05/21 0022 04/06/21 0052  WBC 6.6 7.8 12.8* 13.6* 11.0*  NEUTROABS 3.5  --   --   --   --   HGB 11.9* 11.8* 11.5* 10.5* 10.9*  HCT 36.4 35.4* 35.8* 32.0* 32.2*  MCV 95.5 93.4 94.2 95.0 91.7  PLT 269 266 289 260 158    Basic Metabolic Panel: Recent Labs  Lab 04/01/21 0745 04/02/21 0546 04/03/21 0044 04/05/21 0022 04/06/21 0052  NA 138 139 136 129* 129*  K 3.3* 4.1 3.4* 4.1 3.6  CL 102 105 102 96* 92*  CO2 26 22 22 22 25   GLUCOSE 132* 168* 204* 309* 396*  BUN 19 24* 40* 46* 43*  CREATININE 1.45* 1.51* 1.65* 1.50* 1.48*  CALCIUM 9.3 9.5 9.3 8.8* 8.6*    GFR: Estimated Creatinine Clearance: 31.5 mL/min (A) (by C-G formula based on SCr of 1.48 mg/dL (H)).  Liver Function Tests: No results for input(s): AST, ALT, ALKPHOS, BILITOT, PROT, ALBUMIN in the last 168 hours. No results for input(s): LIPASE, AMYLASE in the last 168 hours. No results for input(s): AMMONIA in the last 168 hours.  Coagulation Profile: No results for input(s): INR, PROTIME in the last 168 hours.  Cardiac Enzymes: No results for input(s): CKTOTAL, CKMB, CKMBINDEX, TROPONINI in the last 168 hours.  BNP (last 3 results) No results for input(s): PROBNP in the last 8760 hours.  Lipid Profile: No results for input(s): CHOL, HDL, LDLCALC, TRIG, CHOLHDL, LDLDIRECT in the last 72 hours.  Thyroid Function Tests: No results for input(s): TSH, T4TOTAL, FREET4, T3FREE, THYROIDAB in the last 72 hours.  Anemia Panel: No results for input(s): VITAMINB12, FOLATE,  FERRITIN, TIBC, IRON, RETICCTPCT in the last 72 hours.  Urine analysis:    Component Value Date/Time   COLORURINE YELLOW 12/20/2017 [place] 12/20/2017 0238   LABSPEC 1.011 12/20/2017 0238   PHURINE 5.0 12/20/2017 0238   GLUCOSEU NEGATIVE 12/20/2017 0238   HGBUR NEGATIVE 12/20/2017 0238   BILIRUBINUR NEGATIVE 12/20/2017 0238   KETONESUR NEGATIVE 12/20/2017 0238   PROTEINUR NEGATIVE 12/20/2017 0238   UROBILINOGEN 0.2 08/07/2014 1304   NITRITE NEGATIVE 12/20/2017 0238   LEUKOCYTESUR NEGATIVE 12/20/2017 0238    Sepsis Labs: Lactic Acid, Venous No results found for: LATICACIDVEN  MICROBIOLOGY: Recent Results (from the past 240 hour(s))  Resp Panel by RT-PCR (Flu A&B, Covid) Nasopharyngeal Swab     Status: None   Collection Time: 04/01/21  4:19 PM   Specimen: Nasopharyngeal Swab; Nasopharyngeal(NP) swabs in vial transport medium  Result Value Ref Range Status   SARS Coronavirus 2 by RT PCR  NEGATIVE NEGATIVE Final    Comment: (NOTE) SARS-CoV-2 target nucleic acids are NOT DETECTED.  The SARS-CoV-2 RNA is generally detectable in upper respiratory specimens during the acute phase of infection. The lowest concentration of SARS-CoV-2 viral copies this assay can detect is 138 copies/mL. A negative result does not preclude SARS-Cov-2 infection and should not be used as the sole basis for treatment or other patient management decisions. A negative result may occur with  improper specimen collection/handling, submission of specimen other than nasopharyngeal swab, presence of viral mutation(s) within the areas targeted by this assay, and inadequate number of viral copies(<138 copies/mL). A negative result must be combined with clinical observations, patient history, and epidemiological information. The expected result is Negative.  Fact Sheet for Patients:  EntrepreneurPulse.com.au  Fact Sheet for Healthcare Providers:   IncredibleEmployment.be  This test is no t yet approved or cleared by the [place] FDA and  has been authorized for detection and/or diagnosis of SARS-CoV-2 by FDA under an Emergency Use Authorization (EUA). This EUA will remain  in effect (meaning this test can be used) for the duration of the COVID-19 declaration under Section 564(b)(1) of the Act, 21 U.S.C.section 360bbb-3(b)(1), unless the authorization is terminated  or revoked sooner.       Influenza A by PCR NEGATIVE NEGATIVE Final   Influenza B by PCR NEGATIVE NEGATIVE Final    Comment: (NOTE) The Xpert Xpress SARS-CoV-2/FLU/RSV plus assay is intended as an aid in the diagnosis of influenza from Nasopharyngeal swab specimens and should not be used as a sole basis for treatment. Nasal washings and aspirates are unacceptable for Xpert Xpress SARS-CoV-2/FLU/RSV testing.  Fact Sheet for Patients: EntrepreneurPulse.com.au  Fact Sheet for Healthcare Providers: IncredibleEmployment.be  This test is not yet approved or cleared by the [place] FDA and has been authorized for detection and/or diagnosis of SARS-CoV-2 by FDA under an Emergency Use Authorization (EUA). This EUA will remain in effect (meaning this test can be used) for the duration of the COVID-19 declaration under Section 564(b)(1) of the Act, 21 U.S.C. section 360bbb-3(b)(1), unless the authorization is terminated or revoked.  Performed at [facility], [place] [address] [place], [place] [address]   Respiratory (~20 pathogens) panel by PCR     Status: Abnormal   Collection Time: 04/01/21  4:19 PM   Specimen: Nasopharyngeal Swab; Respiratory  Result Value Ref Range Status   Adenovirus NOT DETECTED NOT DETECTED Final   Coronavirus 229E NOT DETECTED NOT DETECTED Final    Comment: (NOTE) The Coronavirus on the Respiratory Panel, DOES NOT test for the novel  Coronavirus (2019 nCoV)     Coronavirus HKU1 NOT DETECTED NOT DETECTED Final   Coronavirus NL63 NOT DETECTED NOT DETECTED Final   Coronavirus OC43 NOT DETECTED NOT DETECTED Final   Metapneumovirus DETECTED (A) NOT DETECTED Final   Rhinovirus / Enterovirus NOT DETECTED NOT DETECTED Final   Influenza A NOT DETECTED NOT DETECTED Final   Influenza B NOT DETECTED NOT DETECTED Final   Parainfluenza Virus 1 NOT DETECTED NOT DETECTED Final   Parainfluenza Virus 2 NOT DETECTED NOT DETECTED Final   Parainfluenza Virus 3 NOT DETECTED NOT DETECTED Final   Parainfluenza Virus 4 NOT DETECTED NOT DETECTED Final   Respiratory Syncytial Virus NOT DETECTED NOT DETECTED Final   Bordetella pertussis NOT DETECTED NOT DETECTED Final   Bordetella Parapertussis NOT DETECTED NOT DETECTED Final   Chlamydophila pneumoniae NOT DETECTED NOT DETECTED Final   Mycoplasma pneumoniae NOT DETECTED NOT DETECTED Final  Comment: Performed at [facility], [place] [address] [place], [place] [address]    RADIOLOGY STUDIES/RESULTS: No results found.   LOS: 4 days   Oren Binet, MD  [facility]    To contact the attending provider between 7A-7P or the covering provider during after hours 7P-7A, please log into the web site [email or web address] and access using universal [place] password for that web site. If you do not have the password, please call the hospital operator.  04/06/2021, 9:52 AM

## 2021-04-07 LAB — BASIC METABOLIC PANEL
Anion gap: 9 (ref 5–15)
BUN: 45 mg/dL — ABNORMAL HIGH (ref 8–23)
CO2: 29 mmol/L (ref 22–32)
Calcium: 9 mg/dL (ref 8.9–10.3)
Chloride: 91 mmol/L — ABNORMAL LOW (ref 98–111)
Creatinine, Ser: 1.45 mg/dL — ABNORMAL HIGH (ref 0.44–1.00)
GFR, Estimated: 38 mL/min — ABNORMAL LOW (ref >60)
Glucose, Bld: 321 mg/dL — ABNORMAL HIGH (ref 70–99)
Potassium: 3.5 mmol/L (ref 3.5–5.1)
Sodium: 129 mmol/L — ABNORMAL LOW (ref 135–145)

## 2021-04-07 LAB — GLUCOSE, CAPILLARY
Glucose-Capillary: 334 mg/dL — ABNORMAL HIGH (ref 70–99)
Glucose-Capillary: 363 mg/dL — ABNORMAL HIGH (ref 70–99)
Glucose-Capillary: 252 mg/dL — ABNORMAL HIGH (ref 70–99)
Glucose-Capillary: 174 mg/dL — ABNORMAL HIGH (ref 70–99)

## 2021-04-07 MED ORDER — INSULIN ASPART 100 UNIT/ML IJ SOLN
8.0000 [IU] | Freq: Three times a day (TID) | INTRAMUSCULAR | Status: DC
Start: 2021-04-07 — End: 2021-04-08
  Administered 2021-04-07 – 2021-04-08 (×4): 8 [IU] via SUBCUTANEOUS

## 2021-04-07 MED ORDER — HYDRALAZINE HCL 50 MG PO TABS
75.0000 mg | ORAL_TABLET | Freq: Three times a day (TID) | ORAL | Status: DC
  Administered 2021-04-07 – 2021-04-10 (×8): 75 mg via ORAL
  Filled 2021-04-07 (×9): qty 1

## 2021-04-07 MED ORDER — FUROSEMIDE 40 MG PO TABS
60.0000 mg | ORAL_TABLET | Freq: Every day | ORAL | Status: DC
  Administered 2021-04-07: 60 mg via ORAL
  Filled 2021-04-07: qty 1

## 2021-04-07 NOTE — Progress Notes (Signed)
PROGRESS NOTE        PATIENT DETAILS Name: [name] Age: 70 y.o. Sex: female Date of Birth: 1951/01/24 Admit Date: 04/01/2021 Admitting Physician Marcelyn Bruins, MD NOM:VEHMCN, [name], MD  Brief Narrative: Patient is a 70 y.o. female with history of CKD stage IIIa, HFpEF, HTN, CAD-s/p PCI 2017, HLD, DM-2, depression/anxiety, seizure disorder-who presented with asthma exacerbation in the setting of metapneumovirus infection.  Subjective: Still wheezing-but shortness of breath is better than yesterday.  Still having coughing spells.  Objective: Vitals: Blood pressure (!) 167/87, pulse 77, temperature 98 F (36.7 C), temperature source Oral, resp. rate 16, height 5\' 1"  (1.549 m), weight 73.5 kg, SpO2 98 %.   Exam: Gen Exam:Alert awake-not in any distress HEENT:atraumatic, normocephalic Chest: Coarse wheezing all over. CVS:S1S2 regular Abdomen:soft non tender, non distended Extremities:no edema Neurology: Non focal Skin: no rash   Pertinent Labs/Radiology: Recent Labs  Lab 04/06/21 0052 04/07/21 0249  WBC 11.0*  --   HGB 10.9*  --   PLT 248  --   NA 129* 129*  K 3.6 3.5  CREATININE 1.48* 1.45*      Assessment/Plan: Asthma exacerbation likely provoked by metapneumovirus infection: Slowly improving-still having coughing spells-wheezing-shortness of breath with exertion-not yet back to baseline.  Continue steroids/bronchodilators/Claritin/Flonase/Afrin.  Encourage mobilization-out of bed to chair and other measures.    HTN: BP uncontrolled-increase hydralazine to 75 mg 3 times daily, continue bisoprolol and Cardizem.   HFpEF: Volume status stable-continue Lasix.  No longer on chlorthalidone due to hyponatremia.  Hyponatremia: Unchanged compared to yesterday-no longer on chlorthalidone-reassess on 12/9.  Volume status appears to be stable.  DM-2 (A1c 6.8 on 12/6) with uncontrolled hyperglycemia due to steroid use: CBGs remain  uncontrolled-we will get first dose of Semglee 12 units today-increase Premeal NovoLog to 8 units-continue SSI and follow.    Recent Labs    04/06/21 1653 04/06/21 2019 04/07/21 0744  GLUCAP 294* 386* 334*     GERD: Continue PPI  CKD stage IIIa: Creatinine close to baseline-watch closely.  HLD: Continue statin  CAD s/p PCI 2017: No anginal symptoms  Seizure disorder: Continue valproic acid.  Obesity Estimated body mass index is 30.62 kg/m as calculated from the following:   Height as of this encounter: 5\' 1"  (1.549 m).   Weight as of this encounter: 73.5 kg.     Procedures: None Consults: None DVT Prophylaxis: Lovenox Code Status:Full code  Family Communication: None at bedside  Time spent: 35 minutes-Greater than 50% of this time was spent in counseling, explanation of diagnosis, planning of further management, and coordination of care.   Disposition Plan: Status is: Inpatient  Remains inpatient appropriate because: Asthma exacerbation-still wheezing/short of breath-not yet at baseline.    Diet: Diet Order             Diet Carb Modified Fluid consistency: Thin; Room service appropriate? Yes  Diet effective now                     Antimicrobial agents: Anti-infectives (From admission, onward)    None        MEDICATIONS: Scheduled Meds:  ALPRAZolam  0.5 mg Oral QHS   atorvastatin  40 mg Oral QHS   benzonatate  200 mg Oral TID   budesonide (PULMICORT) nebulizer solution  0.5 mg Nebulization BID   diltiazem  240  mg Oral Daily   divalproex  500 mg Oral QHS   enoxaparin (LOVENOX) injection  40 mg Subcutaneous Q24H   fluticasone  2 spray Each Nare Daily   furosemide  60 mg Oral Daily   guaiFENesin  600 mg Oral BID   hydrALAZINE  75 mg Oral Q8H   insulin aspart  0-15 Units Subcutaneous TID WC   insulin aspart  4 Units Subcutaneous TID WC   insulin glargine-yfgn  12 Units Subcutaneous Daily   ipratropium-albuterol  3 mL Nebulization Q4H    loratadine  10 mg Oral Daily   memantine  10 mg Oral BID   methylPREDNISolone (SOLU-MEDROL) injection  40 mg Intravenous Q12H   nebivolol  10 mg Oral Daily   oxymetazoline  1 spray Each Nare BID   pantoprazole  40 mg Oral Daily   sodium chloride flush  3 mL Intravenous Q12H   Continuous Infusions:  albuterol     PRN Meds:.acetaminophen **OR** acetaminophen, albuterol, chlorpheniramine-HYDROcodone, guaiFENesin-dextromethorphan, HYDROcodone-acetaminophen, polyethylene glycol   I have personally reviewed following labs and imaging studies  LABORATORY DATA: CBC: Recent Labs  Lab 04/01/21 0745 04/02/21 0546 04/03/21 0044 04/05/21 0022 04/06/21 0052  WBC 6.6 7.8 12.8* 13.6* 11.0*  NEUTROABS 3.5  --   --   --   --   HGB 11.9* 11.8* 11.5* 10.5* 10.9*  HCT 36.4 35.4* 35.8* 32.0* 32.2*  MCV 95.5 93.4 94.2 95.0 91.7  PLT 269 266 289 260 248     Basic Metabolic Panel: Recent Labs  Lab 04/02/21 0546 04/03/21 0044 04/05/21 0022 04/06/21 0052 04/07/21 0249  NA 139 136 129* 129* 129*  K 4.1 3.4* 4.1 3.6 3.5  CL 105 102 96* 92* 91*  CO2 22 22 22 25 29   GLUCOSE 168* 204* 309* 396* 321*  BUN 24* 40* 46* 43* 45*  CREATININE 1.51* 1.65* 1.50* 1.48* 1.45*  CALCIUM 9.5 9.3 8.8* 8.6* 9.0     GFR: Estimated Creatinine Clearance: 32.2 mL/min (A) (by C-G formula based on SCr of 1.45 mg/dL (H)).  Liver Function Tests: No results for input(s): AST, ALT, ALKPHOS, BILITOT, PROT, ALBUMIN in the last 168 hours. No results for input(s): LIPASE, AMYLASE in the last 168 hours. No results for input(s): AMMONIA in the last 168 hours.  Coagulation Profile: No results for input(s): INR, PROTIME in the last 168 hours.  Cardiac Enzymes: No results for input(s): CKTOTAL, CKMB, CKMBINDEX, TROPONINI in the last 168 hours.  BNP (last 3 results) No results for input(s): PROBNP in the last 8760 hours.  Lipid Profile: No results for input(s): CHOL, HDL, LDLCALC, TRIG, CHOLHDL, LDLDIRECT in the  last 72 hours.  Thyroid Function Tests: No results for input(s): TSH, T4TOTAL, FREET4, T3FREE, THYROIDAB in the last 72 hours.  Anemia Panel: No results for input(s): VITAMINB12, FOLATE, FERRITIN, TIBC, IRON, RETICCTPCT in the last 72 hours.  Urine analysis:    Component Value Date/Time   COLORURINE YELLOW 12/20/2017 0238   APPEARANCEUR CLEAR 12/20/2017 0238   LABSPEC 1.011 12/20/2017 0238   PHURINE 5.0 12/20/2017 0238   GLUCOSEU NEGATIVE 12/20/2017 0238   HGBUR NEGATIVE 12/20/2017 0238   BILIRUBINUR NEGATIVE 12/20/2017 0238   KETONESUR NEGATIVE 12/20/2017 0238   PROTEINUR NEGATIVE 12/20/2017 0238   UROBILINOGEN 0.2 08/07/2014 1304   NITRITE NEGATIVE 12/20/2017 0238   LEUKOCYTESUR NEGATIVE 12/20/2017 0238    Sepsis Labs: Lactic Acid, Venous No results found for: LATICACIDVEN  MICROBIOLOGY: Recent Results (from the past 240 hour(s))  Resp Panel by RT-PCR (Flu A&B,  Covid) Nasopharyngeal Swab     Status: None   Collection Time: 04/01/21  4:19 PM   Specimen: Nasopharyngeal Swab; Nasopharyngeal(NP) swabs in vial transport medium  Result Value Ref Range Status   SARS Coronavirus 2 by RT PCR NEGATIVE NEGATIVE Final    Comment: (NOTE) SARS-CoV-2 target nucleic acids are NOT DETECTED.  The SARS-CoV-2 RNA is generally detectable in upper respiratory specimens during the acute phase of infection. The lowest concentration of SARS-CoV-2 viral copies this assay can detect is 138 copies/mL. A negative result does not preclude SARS-Cov-2 infection and should not be used as the sole basis for treatment or other patient management decisions. A negative result may occur with  improper specimen collection/handling, submission of specimen other than nasopharyngeal swab, presence of viral mutation(s) within the areas targeted by this assay, and inadequate number of viral copies(<138 copies/mL). A negative result must be combined with clinical observations, patient history, and  epidemiological information. The expected result is Negative.  Fact Sheet for Patients:  EntrepreneurPulse.com.au  Fact Sheet for Healthcare Providers:  IncredibleEmployment.be  This test is no t yet approved or cleared by the [place] FDA and  has been authorized for detection and/or diagnosis of SARS-CoV-2 by FDA under an Emergency Use Authorization (EUA). This EUA will remain  in effect (meaning this test can be used) for the duration of the COVID-19 declaration under Section 564(b)(1) of the Act, 21 U.S.C.section 360bbb-3(b)(1), unless the authorization is terminated  or revoked sooner.       Influenza A by PCR NEGATIVE NEGATIVE Final   Influenza B by PCR NEGATIVE NEGATIVE Final    Comment: (NOTE) The Xpert Xpress SARS-CoV-2/FLU/RSV plus assay is intended as an aid in the diagnosis of influenza from Nasopharyngeal swab specimens and should not be used as a sole basis for treatment. Nasal washings and aspirates are unacceptable for Xpert Xpress SARS-CoV-2/FLU/RSV testing.  Fact Sheet for Patients: EntrepreneurPulse.com.au  Fact Sheet for Healthcare Providers: IncredibleEmployment.be  This test is not yet approved or cleared by the [place] FDA and has been authorized for detection and/or diagnosis of SARS-CoV-2 by FDA under an Emergency Use Authorization (EUA). This EUA will remain in effect (meaning this test can be used) for the duration of the COVID-19 declaration under Section 564(b)(1) of the Act, 21 U.S.C. section 360bbb-3(b)(1), unless the authorization is terminated or revoked.  Performed at [facility], [place] [address] [place], [place] [address]   Respiratory (~20 pathogens) panel by PCR     Status: Abnormal   Collection Time: 04/01/21  4:19 PM   Specimen: Nasopharyngeal Swab; Respiratory  Result Value Ref Range Status   Adenovirus NOT DETECTED NOT DETECTED Final    Coronavirus 229E NOT DETECTED NOT DETECTED Final    Comment: (NOTE) The Coronavirus on the Respiratory Panel, DOES NOT test for the novel  Coronavirus (2019 nCoV)    Coronavirus HKU1 NOT DETECTED NOT DETECTED Final   Coronavirus NL63 NOT DETECTED NOT DETECTED Final   Coronavirus OC43 NOT DETECTED NOT DETECTED Final   Metapneumovirus DETECTED (A) NOT DETECTED Final   Rhinovirus / Enterovirus NOT DETECTED NOT DETECTED Final   Influenza A NOT DETECTED NOT DETECTED Final   Influenza B NOT DETECTED NOT DETECTED Final   Parainfluenza Virus 1 NOT DETECTED NOT DETECTED Final   Parainfluenza Virus 2 NOT DETECTED NOT DETECTED Final   Parainfluenza Virus 3 NOT DETECTED NOT DETECTED Final   Parainfluenza Virus 4 NOT DETECTED NOT DETECTED Final   Respiratory Syncytial Virus  NOT DETECTED NOT DETECTED Final   Bordetella pertussis NOT DETECTED NOT DETECTED Final   Bordetella Parapertussis NOT DETECTED NOT DETECTED Final   Chlamydophila pneumoniae NOT DETECTED NOT DETECTED Final   Mycoplasma pneumoniae NOT DETECTED NOT DETECTED Final    Comment: Performed at [facility], [place] [address], [place], [place] [address]    RADIOLOGY STUDIES/RESULTS: No results found.   LOS: 5 days   Oren Binet, MD  [facility]    To contact the attending provider between 7A-7P or the covering provider during after hours 7P-7A, please log into the web site [email or web address] and access using universal [place] password for that web site. If you do not have the password, please call the hospital operator.  04/07/2021, 10:18 AM

## 2021-04-07 NOTE — Progress Notes (Signed)
Inpatient Diabetes Program Recommendations  AACE/ADA: New Consensus Statement on Inpatient Glycemic Control (2015)  Target Ranges:  Prepandial:   less than 140 mg/dL      Peak postprandial:   less than 180 mg/dL (1-2 hours)      Critically ill patients:  140 - 180 mg/dL   Lab Results  Component Value Date   GLUCAP 363 (H) 04/07/2021   HGBA1C 6.8 (H) 04/05/2021    Review of Glycemic Control  Latest Reference Range & Units 04/06/21 16:53 04/06/21 20:19 04/07/21 07:44 04/07/21 12:41  Glucose-Capillary 70 - 99 mg/dL 294 (H) 386 (H) 334 (H) 363 (H)  (H): Data is abnormally high Diabetes history:  Type 2 DM Outpatient Diabetes medications: Metformin 500 mg bid Current orders for Inpatient glycemic control: Novolog 0-15 units TID & HS, Semglee 12 units qd Solumedrol 80 mg BID   Inpatient Diabetes Program Recommendations:     In the setting of steroids, consider increasing basal to Semglee 12 units BID.   Thanks, Bronson Curb, MSN, RNC-OB Diabetes Coordinator [phone] (8a-5p)

## 2021-04-08 ENCOUNTER — Inpatient Hospital Stay (HOSPITAL_COMMUNITY): Payer: Medicare Other

## 2021-04-08 ENCOUNTER — Encounter (HOSPITAL_COMMUNITY): Payer: Self-pay | Admitting: Internal Medicine

## 2021-04-08 LAB — CREATININE, SERUM
Creatinine, Ser: 1.81 mg/dL — ABNORMAL HIGH (ref 0.44–1.00)
GFR, Estimated: 29 mL/min — ABNORMAL LOW (ref >60)

## 2021-04-08 LAB — GLUCOSE, CAPILLARY
Glucose-Capillary: 309 mg/dL — ABNORMAL HIGH (ref 70–99)
Glucose-Capillary: 235 mg/dL — ABNORMAL HIGH (ref 70–99)
Glucose-Capillary: 245 mg/dL — ABNORMAL HIGH (ref 70–99)
Glucose-Capillary: 421 mg/dL — ABNORMAL HIGH (ref 70–99)

## 2021-04-08 LAB — BASIC METABOLIC PANEL
Anion gap: 13 (ref 5–15)
BUN: 47 mg/dL — ABNORMAL HIGH (ref 8–23)
CO2: 29 mmol/L (ref 22–32)
Calcium: 8.5 mg/dL — ABNORMAL LOW (ref 8.9–10.3)
Chloride: 87 mmol/L — ABNORMAL LOW (ref 98–111)
Creatinine, Ser: 1.74 mg/dL — ABNORMAL HIGH (ref 0.44–1.00)
GFR, Estimated: 31 mL/min — ABNORMAL LOW (ref >60)
Glucose, Bld: 332 mg/dL — ABNORMAL HIGH (ref 70–99)
Potassium: 3.9 mmol/L (ref 3.5–5.1)
Sodium: 129 mmol/L — ABNORMAL LOW (ref 135–145)

## 2021-04-08 MED ORDER — INSULIN GLARGINE-YFGN 100 UNIT/ML ~~LOC~~ SOLN
16.0000 [IU] | Freq: Every day | SUBCUTANEOUS | Status: DC
  Administered 2021-04-09 – 2021-04-10 (×2): 16 [IU] via SUBCUTANEOUS
  Filled 2021-04-08 (×2): qty 0.16

## 2021-04-08 MED ORDER — INSULIN ASPART 100 UNIT/ML IJ SOLN
7.0000 [IU] | Freq: Once | INTRAMUSCULAR | Status: AC
  Administered 2021-04-08: 7 [IU] via SUBCUTANEOUS

## 2021-04-08 MED ORDER — INSULIN ASPART 100 UNIT/ML IJ SOLN
10.0000 [IU] | Freq: Three times a day (TID) | INTRAMUSCULAR | Status: DC
  Administered 2021-04-08 – 2021-04-10 (×5): 10 [IU] via SUBCUTANEOUS

## 2021-04-08 NOTE — Progress Notes (Signed)
PROGRESS NOTE        PATIENT DETAILS Name: [name] Age: 70 y.o. Sex: female Date of Birth: Jan 25, 1951 Admit Date: 04/01/2021 Admitting Physician Marcelyn Bruins, MD PJK:[id number], [name], MD  Brief Narrative: Patient is a 70 y.o. female with history of CKD stage IIIa, HFpEF, HTN, CAD-s/p PCI 2017, HLD, DM-2, depression/anxiety, seizure disorder-who presented with asthma exacerbation in the setting of metapneumovirus infection.  Subjective: Better-less cough-less wheezing today but still not yet back to baseline.  Objective: Vitals: Blood pressure (!) 151/71, pulse 79, temperature 98.8 F (37.1 C), temperature source Oral, resp. rate 18, height 5\' 1"  (1.549 m), weight 73.5 kg, SpO2 97 %.   Exam: Gen Exam:Alert awake-not in any distress HEENT:atraumatic, normocephalic Chest: Moving air well-rhonchi bilaterally. CVS:S1S2 regular Abdomen:soft non tender, non distended Extremities:no edema Neurology: Non focal Skin: no rash   Pertinent Labs/Radiology: Recent Labs  Lab 04/06/21 0052 04/07/21 0249 04/08/21 0554  WBC 11.0*  --   --   HGB 10.9*  --   --   PLT 248  --   --   NA 129*   < > 129*  K 3.6   < > 3.9  CREATININE 1.48*   < > 1.74*  1.81*   < > = values in this interval not displayed.      Assessment/Plan: Asthma exacerbation likely provoked by metapneumovirus infection: Improving-much more better than the past few days-still not yet at baseline-continue steroids/bronchodilators/Claritin/Flonase/Afrin.  Continue to encourage mobilization.    HTN: BP better than yesterday-continue hydralazine, bisoprolol and Cardizem.    HFpEF: Volume status stable-continue Lasix.  No longer on chlorthalidone due to hyponatremia.  Hyponatremia: Mild-hold Lasix-no longer on chlorthalidone.  DM-2 (A1c 6.8 on 12/6) with uncontrolled hyperglycemia due to steroid use: CBGs better-but still uncontrolled-increase Semglee to 16 units, increase Premeal  NovoLog to 10 units-continue SSI and follow.    Recent Labs    04/07/21 2028 04/08/21 0741 04/08/21 1210  GLUCAP 174* 309* 235*     GERD: Continue PPI  CKD stage IIIa: Slight bump in creatinine today-holding Lasix-watch closely.    HLD: Continue statin  CAD s/p PCI 2017: No anginal symptoms  Seizure disorder: Continue valproic acid.  Obesity Estimated body mass index is 30.62 kg/m as calculated from the following:   Height as of this encounter: 5\' 1"  (1.549 m).   Weight as of this encounter: 73.5 kg.     Procedures: None Consults: None DVT Prophylaxis: Lovenox Code Status:Full code  Family Communication: None at bedside  Time spent: 25 minutes-Greater than 50% of this time was spent in counseling, explanation of diagnosis, planning of further management, and coordination of care.   Disposition Plan: Status is: Inpatient  Remains inpatient appropriate because: Asthma exacerbation-still wheezing/short of breath-not yet at baseline.    Diet: Diet Order             Diet Carb Modified Fluid consistency: Thin; Room service appropriate? Yes  Diet effective now                     Antimicrobial agents: Anti-infectives (From admission, onward)    None        MEDICATIONS: Scheduled Meds:  ALPRAZolam  0.5 mg Oral QHS   atorvastatin  40 mg Oral QHS   benzonatate  200 mg Oral TID   budesonide (PULMICORT) nebulizer  solution  0.5 mg Nebulization BID   diltiazem  240 mg Oral Daily   divalproex  500 mg Oral QHS   enoxaparin (LOVENOX) injection  40 mg Subcutaneous Q24H   fluticasone  2 spray Each Nare Daily   guaiFENesin  600 mg Oral BID   hydrALAZINE  75 mg Oral Q8H   insulin aspart  0-15 Units Subcutaneous TID WC   insulin aspart  8 Units Subcutaneous TID WC   insulin glargine-yfgn  12 Units Subcutaneous Daily   ipratropium-albuterol  3 mL Nebulization Q4H   loratadine  10 mg Oral Daily   memantine  10 mg Oral BID   methylPREDNISolone  (SOLU-MEDROL) injection  40 mg Intravenous Q12H   nebivolol  10 mg Oral Daily   oxymetazoline  1 spray Each Nare BID   pantoprazole  40 mg Oral Daily   sodium chloride flush  3 mL Intravenous Q12H   Continuous Infusions:  albuterol     PRN Meds:.acetaminophen **OR** acetaminophen, albuterol, chlorpheniramine-HYDROcodone, guaiFENesin-dextromethorphan, HYDROcodone-acetaminophen, polyethylene glycol   I have personally reviewed following labs and imaging studies  LABORATORY DATA: CBC: Recent Labs  Lab 04/02/21 0546 04/03/21 0044 04/05/21 0022 04/06/21 0052  WBC 7.8 12.8* 13.6* 11.0*  HGB 11.8* 11.5* 10.5* 10.9*  HCT 35.4* 35.8* 32.0* 32.2*  MCV 93.4 94.2 95.0 91.7  PLT 266 289 260 248     Basic Metabolic Panel: Recent Labs  Lab 04/03/21 0044 04/05/21 0022 04/06/21 0052 04/07/21 0249 04/08/21 0554  NA 136 129* 129* 129* 129*  K 3.4* 4.1 3.6 3.5 3.9  CL 102 96* 92* 91* 87*  CO2 22 22 25 29 29   GLUCOSE 204* 309* 396* 321* 332*  BUN 40* 46* 43* 45* 47*  CREATININE 1.65* 1.50* 1.48* 1.45* 1.74*  1.81*  CALCIUM 9.3 8.8* 8.6* 9.0 8.5*     GFR: Estimated Creatinine Clearance: 25.8 mL/min (A) (by C-G formula based on SCr of 1.81 mg/dL (H)).  Liver Function Tests: No results for input(s): AST, ALT, ALKPHOS, BILITOT, PROT, ALBUMIN in the last 168 hours. No results for input(s): LIPASE, AMYLASE in the last 168 hours. No results for input(s): AMMONIA in the last 168 hours.  Coagulation Profile: No results for input(s): INR, PROTIME in the last 168 hours.  Cardiac Enzymes: No results for input(s): CKTOTAL, CKMB, CKMBINDEX, TROPONINI in the last 168 hours.  BNP (last 3 results) No results for input(s): PROBNP in the last 8760 hours.  Lipid Profile: No results for input(s): CHOL, HDL, LDLCALC, TRIG, CHOLHDL, LDLDIRECT in the last 72 hours.  Thyroid Function Tests: No results for input(s): TSH, T4TOTAL, FREET4, T3FREE, THYROIDAB in the last 72 hours.  Anemia  Panel: No results for input(s): VITAMINB12, FOLATE, FERRITIN, TIBC, IRON, RETICCTPCT in the last 72 hours.  Urine analysis:    Component Value Date/Time   COLORURINE YELLOW 12/20/2017 0238   APPEARANCEUR CLEAR 12/20/2017 0238   LABSPEC 1.011 12/20/2017 0238   PHURINE 5.0 12/20/2017 0238   GLUCOSEU NEGATIVE 12/20/2017 0238   HGBUR NEGATIVE 12/20/2017 0238   BILIRUBINUR NEGATIVE 12/20/2017 0238   KETONESUR NEGATIVE 12/20/2017 0238   PROTEINUR NEGATIVE 12/20/2017 0238   UROBILINOGEN 0.2 08/07/2014 1304   NITRITE NEGATIVE 12/20/2017 0238   LEUKOCYTESUR NEGATIVE 12/20/2017 0238    Sepsis Labs: Lactic Acid, Venous No results found for: LATICACIDVEN  MICROBIOLOGY: Recent Results (from the past 240 hour(s))  Resp Panel by RT-PCR (Flu A&B, Covid) Nasopharyngeal Swab     Status: None   Collection Time: 04/01/21  4:19 PM  Specimen: Nasopharyngeal Swab; Nasopharyngeal(NP) swabs in vial transport medium  Result Value Ref Range Status   SARS Coronavirus 2 by RT PCR NEGATIVE NEGATIVE Final    Comment: (NOTE) SARS-CoV-2 target nucleic acids are NOT DETECTED.  The SARS-CoV-2 RNA is generally detectable in upper respiratory specimens during the acute phase of infection. The lowest concentration of SARS-CoV-2 viral copies this assay can detect is 138 copies/mL. A negative result does not preclude SARS-Cov-2 infection and should not be used as the sole basis for treatment or other patient management decisions. A negative result may occur with  improper specimen collection/handling, submission of specimen other than nasopharyngeal swab, presence of viral mutation(s) within the areas targeted by this assay, and inadequate number of viral copies(<138 copies/mL). A negative result must be combined with clinical observations, patient history, and epidemiological information. The expected result is Negative.  Fact Sheet for Patients:  EntrepreneurPulse.com.au  Fact Sheet for  Healthcare Providers:  IncredibleEmployment.be  This test is no t yet approved or cleared by the [place] FDA and  has been authorized for detection and/or diagnosis of SARS-CoV-2 by FDA under an Emergency Use Authorization (EUA). This EUA will remain  in effect (meaning this test can be used) for the duration of the COVID-19 declaration under Section 564(b)(1) of the Act, 21 U.S.C.section 360bbb-3(b)(1), unless the authorization is terminated  or revoked sooner.       Influenza A by PCR NEGATIVE NEGATIVE Final   Influenza B by PCR NEGATIVE NEGATIVE Final    Comment: (NOTE) The Xpert Xpress SARS-CoV-2/FLU/RSV plus assay is intended as an aid in the diagnosis of influenza from Nasopharyngeal swab specimens and should not be used as a sole basis for treatment. Nasal washings and aspirates are unacceptable for Xpert Xpress SARS-CoV-2/FLU/RSV testing.  Fact Sheet for Patients: EntrepreneurPulse.com.au  Fact Sheet for Healthcare Providers: IncredibleEmployment.be  This test is not yet approved or cleared by the [place] FDA and has been authorized for detection and/or diagnosis of SARS-CoV-2 by FDA under an Emergency Use Authorization (EUA). This EUA will remain in effect (meaning this test can be used) for the duration of the COVID-19 declaration under Section 564(b)(1) of the Act, 21 U.S.C. section 360bbb-3(b)(1), unless the authorization is terminated or revoked.  Performed at [facility], [place] [address], [place], [place] [address]   Respiratory (~20 pathogens) panel by PCR     Status: Abnormal   Collection Time: 04/01/21  4:19 PM   Specimen: Nasopharyngeal Swab; Respiratory  Result Value Ref Range Status   Adenovirus NOT DETECTED NOT DETECTED Final   Coronavirus 229E NOT DETECTED NOT DETECTED Final    Comment: (NOTE) The Coronavirus on the Respiratory Panel, DOES NOT test for the novel  Coronavirus  (2019 nCoV)    Coronavirus HKU1 NOT DETECTED NOT DETECTED Final   Coronavirus NL63 NOT DETECTED NOT DETECTED Final   Coronavirus OC43 NOT DETECTED NOT DETECTED Final   Metapneumovirus DETECTED (A) NOT DETECTED Final   Rhinovirus / Enterovirus NOT DETECTED NOT DETECTED Final   Influenza A NOT DETECTED NOT DETECTED Final   Influenza B NOT DETECTED NOT DETECTED Final   Parainfluenza Virus 1 NOT DETECTED NOT DETECTED Final   Parainfluenza Virus 2 NOT DETECTED NOT DETECTED Final   Parainfluenza Virus 3 NOT DETECTED NOT DETECTED Final   Parainfluenza Virus 4 NOT DETECTED NOT DETECTED Final   Respiratory Syncytial Virus NOT DETECTED NOT DETECTED Final   Bordetella pertussis NOT DETECTED NOT DETECTED Final   Bordetella Parapertussis NOT  DETECTED NOT DETECTED Final   Chlamydophila pneumoniae NOT DETECTED NOT DETECTED Final   Mycoplasma pneumoniae NOT DETECTED NOT DETECTED Final    Comment: Performed at [facility], [place] [address] [place], [place] [address]    RADIOLOGY STUDIES/RESULTS: DG Chest Port 1V same Day  Result Date: 04/08/2021 CLINICAL DATA:  Shortness of breath EXAM: PORTABLE CHEST 1 VIEW COMPARISON:  Previous studies including the examination of 04/01/2021 FINDINGS: Transverse diameter of heart is increased. Thoracic aorta is tortuous and ectatic. There are no signs of alveolar pulmonary edema or focal pulmonary consolidation. There are small linear densities in the lower lung fields, more so on the left side. There is no pleural effusion or pneumothorax. IMPRESSION: There are no signs of pulmonary edema or focal pulmonary consolidation. Small linear densities seen in the lower lung fields, more so on the left side suggesting subsegmental atelectasis. Electronically Signed   By: Elmer Picker M.D.   On: 04/08/2021 09:12     LOS: 6 days   Oren Binet, MD  [facility]    To contact the attending provider between 7A-7P or the covering provider during after  hours 7P-7A, please log into the web site [email or web address] and access using universal [place] password for that web site. If you do not have the password, please call the hospital operator.  04/08/2021, 12:31 PM

## 2021-04-08 NOTE — Care Management (Signed)
Transition of Care Department [facility]) has reviewed patient and no TOC needs have been identified at this time. We will continue to monitor patient advancement through interdisciplinary progression rounds. If new patient transition needs arise, please place a TOC consult.

## 2021-04-08 NOTE — Progress Notes (Signed)
Inpatient Diabetes Program Recommendations  AACE/ADA: New Consensus Statement on Inpatient Glycemic Control (2015)  Target Ranges:  Prepandial:   less than 140 mg/dL      Peak postprandial:   less than 180 mg/dL (1-2 hours)      Critically ill patients:  140 - 180 mg/dL   Lab Results  Component Value Date   GLUCAP 309 (H) 04/08/2021   HGBA1C 6.8 (H) 04/05/2021    Review of Glycemic Control  Latest Reference Range & Units 04/07/21 12:41 04/07/21 17:04 04/07/21 20:28 04/08/21 07:41  Glucose-Capillary 70 - 99 mg/dL 363 (H) 252 (H) 174 (H) 309 (H)  Diabetes history:  Type 2 DM Outpatient Diabetes medications: Metformin 500 mg bid Current orders for Inpatient glycemic control: Novolog 0-15 units TID & HS, Semglee 12 units qd, Novolog 8 units TID Solumedrol 80 mg BID   Inpatient Diabetes Program Recommendations:     In the setting of steroids, consider increasing basal to Semglee 12 units BID and increasing Novolog 10 units TID (assuming patient is consuming >50% of meals).  Thanks, Bronson Curb, MSN, RNC-OB Diabetes Coordinator [phone] (8a-5p)

## 2021-04-08 NOTE — Progress Notes (Signed)
Dr. Hal Hope paged concerning HS CBG of 421.  She has no scheduled nighttime coverage.  Dr. Hal Hope ordered 7 units novolog SQ.  Will administer and continue to monitor patient.

## 2021-04-09 LAB — BASIC METABOLIC PANEL
Anion gap: 11 (ref 5–15)
BUN: 50 mg/dL — ABNORMAL HIGH (ref 8–23)
CO2: 31 mmol/L (ref 22–32)
Calcium: 8.7 mg/dL — ABNORMAL LOW (ref 8.9–10.3)
Chloride: 89 mmol/L — ABNORMAL LOW (ref 98–111)
Creatinine, Ser: 1.58 mg/dL — ABNORMAL HIGH (ref 0.44–1.00)
GFR, Estimated: 35 mL/min — ABNORMAL LOW (ref >60)
Glucose, Bld: 240 mg/dL — ABNORMAL HIGH (ref 70–99)
Potassium: 3.5 mmol/L (ref 3.5–5.1)
Sodium: 131 mmol/L — ABNORMAL LOW (ref 135–145)

## 2021-04-09 LAB — GLUCOSE, CAPILLARY
Glucose-Capillary: 247 mg/dL — ABNORMAL HIGH (ref 70–99)
Glucose-Capillary: 374 mg/dL — ABNORMAL HIGH (ref 70–99)
Glucose-Capillary: 269 mg/dL — ABNORMAL HIGH (ref 70–99)
Glucose-Capillary: 273 mg/dL — ABNORMAL HIGH (ref 70–99)

## 2021-04-09 MED ORDER — IPRATROPIUM-ALBUTEROL 0.5-2.5 (3) MG/3ML IN SOLN
3.0000 mL | Freq: Four times a day (QID) | RESPIRATORY_TRACT | Status: DC
  Filled 2021-04-09: qty 3

## 2021-04-09 MED ORDER — PREDNISONE 20 MG PO TABS
40.0000 mg | ORAL_TABLET | Freq: Every day | ORAL | Status: DC
  Administered 2021-04-09 – 2021-04-10 (×2): 40 mg via ORAL
  Filled 2021-04-09 (×2): qty 2

## 2021-04-09 NOTE — Progress Notes (Signed)
PROGRESS NOTE        PATIENT DETAILS Name: [name] Age: 70 y.o. Sex: female Date of Birth: January 24, 1951 Admit Date: 04/01/2021 Admitting Physician Marcelyn Bruins, MD [id number], [name], MD  Brief Narrative: Patient is a 70 y.o. female with history of CKD stage IIIa, HFpEF, HTN, CAD-s/p PCI 2017, HLD, DM-2, depression/anxiety, seizure disorder-who presented with asthma exacerbation in the setting of metapneumovirus infection.  Subjective: Coughing improving-feels better-still wheezing but much better than yesterday.  Objective: Vitals: Blood pressure (!) 156/71, pulse 95, temperature 99.9 F (37.7 C), temperature source Oral, resp. rate 15, height 5\' 1"  (1.549 m), weight 73.2 kg, SpO2 96 %.   Exam: Gen Exam:Alert awake-not in any distress HEENT:atraumatic, normocephalic Chest: Few scattered rhonchi. CVS:S1S2 regular Abdomen:soft non tender, non distended Extremities:no edema Neurology: Non focal Skin: no rash   Pertinent Labs/Radiology: Recent Labs  Lab 04/06/21 0052 04/07/21 0249 04/09/21 0119  WBC 11.0*  --   --   HGB 10.9*  --   --   PLT 248  --   --   NA 129*   < > 131*  K 3.6   < > 3.5  CREATININE 1.48*   < > 1.58*   < > = values in this interval not displayed.      Assessment/Plan: Asthma exacerbation likely provoked by metapneumovirus infection: Continues to improve-some wheezing but much better than the past few days-moving air well-changed to prednisone-continue bronchodilators/Claritin/Flonase.  Continue to encourage mobilization-if clinical improvement continues-suspect home on 12/11.  HTN: BP better than yesterday-continue hydralazine, bisoprolol and Cardizem.    HFpEF: Volume status stable.  No longer on chlorthalidone due to hyponatremia.  Hyponatremia: Mild-continue to watch closely.  DM-2 (A1c 6.8 on 12/6) with uncontrolled hyperglycemia due to steroid use: CBGs continue to be on the higher side-we will get  first dose of 16 units of Semglee today-continue NovoLog and SSI.  Steroids being tapered down-suspect CBGs will improve over the next few days   Recent Labs    04/08/21 1634 04/08/21 2148 04/09/21 0737  GLUCAP 245* 421* 247*     GERD: Continue PPI  CKD stage IIIa: Creatinine close to baseline-watch closely.  HLD: Continue statin  CAD s/p PCI 2017: No anginal symptoms  Seizure disorder: Continue valproic acid.  Obesity Estimated body mass index is 30.49 kg/m as calculated from the following:   Height as of this encounter: 5\' 1"  (1.549 m).   Weight as of this encounter: 73.2 kg.     Procedures: None Consults: None DVT Prophylaxis: Lovenox Code Status:Full code  Family Communication: None at bedside  Time spent: 25 minutes-Greater than 50% of this time was spent in counseling, explanation of diagnosis, planning of further management, and coordination of care.   Disposition Plan: Status is: Inpatient  Remains inpatient appropriate because: Asthma exacerbation-still wheezing/short of breath-not yet at baseline.  If clinical improvement continues-suspect home on 12/11    Diet: Diet Order             Diet Carb Modified Fluid consistency: Thin; Room service appropriate? Yes  Diet effective now                     Antimicrobial agents: Anti-infectives (From admission, onward)    None        MEDICATIONS: Scheduled Meds:  ALPRAZolam  0.5 mg Oral QHS  atorvastatin  40 mg Oral QHS   benzonatate  200 mg Oral TID   budesonide (PULMICORT) nebulizer solution  0.5 mg Nebulization BID   diltiazem  240 mg Oral Daily   divalproex  500 mg Oral QHS   enoxaparin (LOVENOX) injection  40 mg Subcutaneous Q24H   fluticasone  2 spray Each Nare Daily   guaiFENesin  600 mg Oral BID   hydrALAZINE  75 mg Oral Q8H   insulin aspart  0-15 Units Subcutaneous TID WC   insulin aspart  10 Units Subcutaneous TID WC   insulin glargine-yfgn  16 Units Subcutaneous Daily    ipratropium-albuterol  3 mL Nebulization Q4H   loratadine  10 mg Oral Daily   memantine  10 mg Oral BID   nebivolol  10 mg Oral Daily   pantoprazole  40 mg Oral Daily   sodium chloride flush  3 mL Intravenous Q12H   Continuous Infusions:  albuterol     PRN Meds:.acetaminophen **OR** acetaminophen, albuterol, chlorpheniramine-HYDROcodone, guaiFENesin-dextromethorphan, HYDROcodone-acetaminophen, polyethylene glycol   I have personally reviewed following labs and imaging studies  LABORATORY DATA: CBC: Recent Labs  Lab 04/03/21 0044 04/05/21 0022 04/06/21 0052  WBC 12.8* 13.6* 11.0*  HGB 11.5* 10.5* 10.9*  HCT 35.8* 32.0* 32.2*  MCV 94.2 95.0 91.7  PLT 289 260 248     Basic Metabolic Panel: Recent Labs  Lab 04/05/21 0022 04/06/21 0052 04/07/21 0249 04/08/21 0554 04/09/21 0119  NA 129* 129* 129* 129* 131*  K 4.1 3.6 3.5 3.9 3.5  CL 96* 92* 91* 87* 89*  CO2 22 25 29 29 31   GLUCOSE 309* 396* 321* 332* 240*  BUN 46* 43* 45* 47* 50*  CREATININE 1.50* 1.48* 1.45* 1.74*  1.81* 1.58*  CALCIUM 8.8* 8.6* 9.0 8.5* 8.7*     GFR: Estimated Creatinine Clearance: 29.5 mL/min (A) (by C-G formula based on SCr of 1.58 mg/dL (H)).  Liver Function Tests: No results for input(s): AST, ALT, ALKPHOS, BILITOT, PROT, ALBUMIN in the last 168 hours. No results for input(s): LIPASE, AMYLASE in the last 168 hours. No results for input(s): AMMONIA in the last 168 hours.  Coagulation Profile: No results for input(s): INR, PROTIME in the last 168 hours.  Cardiac Enzymes: No results for input(s): CKTOTAL, CKMB, CKMBINDEX, TROPONINI in the last 168 hours.  BNP (last 3 results) No results for input(s): PROBNP in the last 8760 hours.  Lipid Profile: No results for input(s): CHOL, HDL, LDLCALC, TRIG, CHOLHDL, LDLDIRECT in the last 72 hours.  Thyroid Function Tests: No results for input(s): TSH, T4TOTAL, FREET4, T3FREE, THYROIDAB in the last 72 hours.  Anemia Panel: No results for  input(s): VITAMINB12, FOLATE, FERRITIN, TIBC, IRON, RETICCTPCT in the last 72 hours.  Urine analysis:    Component Value Date/Time   COLORURINE YELLOW 12/20/2017 0238   APPEARANCEUR CLEAR 12/20/2017 0238   LABSPEC 1.011 12/20/2017 0238   PHURINE 5.0 12/20/2017 0238   GLUCOSEU NEGATIVE 12/20/2017 0238   HGBUR NEGATIVE 12/20/2017 0238   BILIRUBINUR NEGATIVE 12/20/2017 0238   KETONESUR NEGATIVE 12/20/2017 0238   PROTEINUR NEGATIVE 12/20/2017 0238   UROBILINOGEN 0.2 08/07/2014 1304   NITRITE NEGATIVE 12/20/2017 0238   LEUKOCYTESUR NEGATIVE 12/20/2017 0238    Sepsis Labs: Lactic Acid, Venous No results found for: LATICACIDVEN  MICROBIOLOGY: Recent Results (from the past 240 hour(s))  Resp Panel by RT-PCR (Flu A&B, Covid) Nasopharyngeal Swab     Status: None   Collection Time: 04/01/21  4:19 PM   Specimen: Nasopharyngeal Swab; Nasopharyngeal(NP) swabs  in vial transport medium  Result Value Ref Range Status   SARS Coronavirus 2 by RT PCR NEGATIVE NEGATIVE Final    Comment: (NOTE) SARS-CoV-2 target nucleic acids are NOT DETECTED.  The SARS-CoV-2 RNA is generally detectable in upper respiratory specimens during the acute phase of infection. The lowest concentration of SARS-CoV-2 viral copies this assay can detect is 138 copies/mL. A negative result does not preclude SARS-Cov-2 infection and should not be used as the sole basis for treatment or other patient management decisions. A negative result may occur with  improper specimen collection/handling, submission of specimen other than nasopharyngeal swab, presence of viral mutation(s) within the areas targeted by this assay, and inadequate number of viral copies(<138 copies/mL). A negative result must be combined with clinical observations, patient history, and epidemiological information. The expected result is Negative.  Fact Sheet for Patients:  EntrepreneurPulse.com.au  Fact Sheet for Healthcare Providers:   IncredibleEmployment.be  This test is no t yet approved or cleared by the [place] FDA and  has been authorized for detection and/or diagnosis of SARS-CoV-2 by FDA under an Emergency Use Authorization (EUA). This EUA will remain  in effect (meaning this test can be used) for the duration of the COVID-19 declaration under Section 564(b)(1) of the Act, 21 U.S.C.section 360bbb-3(b)(1), unless the authorization is terminated  or revoked sooner.       Influenza A by PCR NEGATIVE NEGATIVE Final   Influenza B by PCR NEGATIVE NEGATIVE Final    Comment: (NOTE) The Xpert Xpress SARS-CoV-2/FLU/RSV plus assay is intended as an aid in the diagnosis of influenza from Nasopharyngeal swab specimens and should not be used as a sole basis for treatment. Nasal washings and aspirates are unacceptable for Xpert Xpress SARS-CoV-2/FLU/RSV testing.  Fact Sheet for Patients: EntrepreneurPulse.com.au  Fact Sheet for Healthcare Providers: IncredibleEmployment.be  This test is not yet approved or cleared by the [place] FDA and has been authorized for detection and/or diagnosis of SARS-CoV-2 by FDA under an Emergency Use Authorization (EUA). This EUA will remain in effect (meaning this test can be used) for the duration of the COVID-19 declaration under Section 564(b)(1) of the Act, 21 U.S.C. section 360bbb-3(b)(1), unless the authorization is terminated or revoked.  Performed at [facility], [place] [address], [place], [place] [address]   Respiratory (~20 pathogens) panel by PCR     Status: Abnormal   Collection Time: 04/01/21  4:19 PM   Specimen: Nasopharyngeal Swab; Respiratory  Result Value Ref Range Status   Adenovirus NOT DETECTED NOT DETECTED Final   Coronavirus 229E NOT DETECTED NOT DETECTED Final    Comment: (NOTE) The Coronavirus on the Respiratory Panel, DOES NOT test for the novel  Coronavirus (2019 nCoV)     Coronavirus HKU1 NOT DETECTED NOT DETECTED Final   Coronavirus NL63 NOT DETECTED NOT DETECTED Final   Coronavirus OC43 NOT DETECTED NOT DETECTED Final   Metapneumovirus DETECTED (A) NOT DETECTED Final   Rhinovirus / Enterovirus NOT DETECTED NOT DETECTED Final   Influenza A NOT DETECTED NOT DETECTED Final   Influenza B NOT DETECTED NOT DETECTED Final   Parainfluenza Virus 1 NOT DETECTED NOT DETECTED Final   Parainfluenza Virus 2 NOT DETECTED NOT DETECTED Final   Parainfluenza Virus 3 NOT DETECTED NOT DETECTED Final   Parainfluenza Virus 4 NOT DETECTED NOT DETECTED Final   Respiratory Syncytial Virus NOT DETECTED NOT DETECTED Final   Bordetella pertussis NOT DETECTED NOT DETECTED Final   Bordetella Parapertussis NOT DETECTED NOT DETECTED Final  Chlamydophila pneumoniae NOT DETECTED NOT DETECTED Final   Mycoplasma pneumoniae NOT DETECTED NOT DETECTED Final    Comment: Performed at [facility], [place] [address], [place], [place] [address]    RADIOLOGY STUDIES/RESULTS: DG Chest Port 1V same Day  Result Date: 04/08/2021 CLINICAL DATA:  Shortness of breath EXAM: PORTABLE CHEST 1 VIEW COMPARISON:  Previous studies including the examination of 04/01/2021 FINDINGS: Transverse diameter of heart is increased. Thoracic aorta is tortuous and ectatic. There are no signs of alveolar pulmonary edema or focal pulmonary consolidation. There are small linear densities in the lower lung fields, more so on the left side. There is no pleural effusion or pneumothorax. IMPRESSION: There are no signs of pulmonary edema or focal pulmonary consolidation. Small linear densities seen in the lower lung fields, more so on the left side suggesting subsegmental atelectasis. Electronically Signed   By: Elmer Picker M.D.   On: 04/08/2021 09:12     LOS: 7 days   Oren Binet, MD  [facility]    To contact the attending provider between 7A-7P or the covering provider during after hours 7P-7A, please  log into the web site [email or web address] and access using universal [place] password for that web site. If you do not have the password, please call the hospital operator.  04/09/2021, 11:19 AM

## 2021-04-10 LAB — GLUCOSE, CAPILLARY: Glucose-Capillary: 277 mg/dL — ABNORMAL HIGH (ref 70–99)

## 2021-04-10 MED ORDER — LORATADINE 10 MG PO TABS: 10.0000 mg | ORAL_TABLET | Freq: Every day | ORAL | 0 refills | Status: DC

## 2021-04-10 MED ORDER — HYDROCOD POLST-CPM POLST ER 10-8 MG/5ML PO SUER: 5.0000 mL | Freq: Two times a day (BID) | ORAL | 0 refills | Status: DC | PRN

## 2021-04-10 MED ORDER — ALBUTEROL SULFATE HFA 108 (90 BASE) MCG/ACT IN AERS: 2.0000 | INHALATION_SPRAY | RESPIRATORY_TRACT | 5 refills | Status: DC | PRN

## 2021-04-10 MED ORDER — HYDRALAZINE HCL 100 MG PO TABS: 100.0000 mg | ORAL_TABLET | Freq: Three times a day (TID) | ORAL | 3 refills | Status: DC

## 2021-04-10 MED ORDER — PREDNISONE 10 MG PO TABS: ORAL_TABLET | ORAL | 0 refills | Status: DC

## 2021-04-10 MED ORDER — BENZONATATE 200 MG PO CAPS: 200.0000 mg | ORAL_CAPSULE | Freq: Three times a day (TID) | ORAL | 0 refills | Status: DC

## 2021-04-10 MED ORDER — IPRATROPIUM-ALBUTEROL 0.5-2.5 (3) MG/3ML IN SOLN
3.0000 mL | Freq: Three times a day (TID) | RESPIRATORY_TRACT | 1 refills | Status: DC | PRN
Start: 2021-04-10 — End: 2021-05-17

## 2021-04-10 NOTE — Discharge Summary (Signed)
PATIENT DETAILS Name: [name] Age: 70 y.o. Sex: female Date of Birth: 07-11-1950 MRN: [id number]. Admitting Physician: Marcelyn Bruins, MD YHC:[id number], [name], MD  Admit Date: 04/01/2021 Discharge date: 04/10/2021  Recommendations for Outpatient Follow-up:  Follow up with PCP in 1-2 weeks Please obtain CMP/CBC in one week   Admitted From:  Home  Disposition: [place]: Yes  Equipment/Devices: None  Discharge Condition: Stable  CODE STATUS: FULL CODE  Diet recommendation:  Diet Order             Diet - low sodium heart healthy           Diet Carb Modified           Diet Carb Modified Fluid consistency: Thin; Room service appropriate? Yes  Diet effective now                    Brief Summary: Patient is a 70 y.o. female with history of CKD stage IIIa, HFpEF, HTN, CAD-s/p PCI 2017, HLD, DM-2, depression/anxiety, seizure disorder-who presented with asthma exacerbation in the setting of metapneumovirus infection.  Brief Hospital Course: Asthma exacerbation likely provoked by metapneumovirus infection: Treated with steroids/bronchodilators-along with Claritin/Flonase/Afrin for rhinitis.  Significantly better-ambulating in the [place] room air.  Moving air well-no longer feels "tight".  Continues to have some coughing spells.  Still some scattered rhonchi on exam but significantly better than just the past 2-3 days.  Suspect she is stable for discharge-continue tapering steroids-bronchodilators.  Please ensure patient follows up with her primary pulmonologist.   HTN: BP better controlled-continue hydralazine, bisoprolol and Cardizem-follow with primary care practitioner for further optimization   HFpEF: Volume status stable.  No longer on chlorthalidone due to hyponatremia/AKI.   Hyponatremia: Very mild-asymptomatic-repeat electrolytes in 1 week.   DM-2 (A1c 6.8 on 12/6) with uncontrolled hyperglycemia due to steroid use: CBGs improving  as patient no longer on IV steroids-suspect need to tolerate a few more days of some amount of hyperglycemia as steroids get tapered down.  Resume oral hypoglycemic regimen on discharge.     GERD: Continue PPI   CKD stage IIIa: Creatinine close to baseline-watch closely.   HLD: Continue statin   CAD s/p PCI 2017: No anginal symptoms   Seizure disorder: Continue valproic acid.   Obesity Estimated body mass index is 30.49 kg/m as calculated from the following:   Height as of this encounter: 5\' 1"  (1.549 m).   Weight as of this encounter: 73.2 kg.     Procedures None  Discharge Diagnoses:  Principal Problem:   Asthma exacerbation Active Problems:   Depression with anxiety   Essential hypertension   G E R D   Diabetes mellitus type 2, controlled (HCC)   CAD in native artery   Seizures (HCC)   HLD (hyperlipidemia)   Chronic diastolic CHF (congestive heart failure) (HCC)   Macrocytic anemia   CKD (chronic kidney disease), stage III [facility])   Discharge Instructions:  Activity:  As tolerated   Discharge Instructions     Diet - low sodium heart healthy   Complete by: As directed    Diet Carb Modified   Complete by: As directed    Discharge instructions   Complete by: As directed    Follow with Primary MD  [name], MD in 1-2 weeks  Please get a complete blood count and chemistry panel checked by your Primary MD at your next visit, and again as instructed by your Primary MD.  Get Medicines reviewed and adjusted: Please take all your medications with you for your next visit with your Primary MD  Laboratory/radiological data: Please request your Primary MD to go over all hospital tests and procedure/radiological results at the follow up, please ask your Primary MD to get all Hospital records sent to his/her office.  In some cases, they will be blood work, cultures and biopsy results pending at the time of your discharge. Please request that your primary care M.D.  follows up on these results.  Also Note the following: If you experience worsening of your admission symptoms, develop shortness of breath, life threatening emergency, suicidal or homicidal thoughts you must seek medical attention immediately by calling 911 or calling your MD immediately  if symptoms less severe.  You must read complete instructions/literature along with all the possible adverse reactions/side effects for all the Medicines you take and that have been prescribed to you. Take any new Medicines after you have completely understood and accpet all the possible adverse reactions/side effects.   Do not drive when taking Pain medications or sleeping medications (Benzodaizepines)  Do not take more than prescribed Pain, Sleep and Anxiety Medications. It is not advisable to combine anxiety,sleep and pain medications without talking with your primary care practitioner  Special Instructions: If you have smoked or chewed Tobacco  in the last 2 yrs please stop smoking, stop any regular Alcohol  and or any Recreational drug use.  Wear Seat belts while driving.  Please note: You were cared for by a hospitalist during your hospital stay. Once you are discharged, your primary care physician will handle any further medical issues. Please note that NO REFILLS for any discharge medications will be authorized once you are discharged, as it is imperative that you return to your primary care physician (or establish a relationship with a primary care physician if you do not have one) for your post hospital discharge needs so that they can reassess your need for medications and monitor your lab values.   Increase activity slowly   Complete by: As directed       Allergies as of 04/10/2021       Reactions   Tramadol Other (See Comments)   Seizure d/o        Medication List     STOP taking these medications    chlorthalidone 25 MG tablet Commonly known as: HYGROTON       TAKE these  medications    albuterol 108 (90 Base) MCG/ACT inhaler Commonly known as: ProAir HFA Inhale 2 puffs into the lungs every 4 (four) hours as needed for wheezing or shortness of breath.   ALPRAZolam 0.5 MG tablet Commonly known as: XANAX Take 1 tablet (0.5 mg total) by mouth 2 (two) times daily as needed for anxiety. What changed: when to take this   atorvastatin 40 MG tablet Commonly known as: LIPITOR Take 40 mg by mouth at bedtime.   benzonatate 200 MG capsule Commonly known as: TESSALON Take 1 capsule (200 mg total) by mouth 3 (three) times daily.   Biotin 5000 MCG Caps Take 5,000 mcg by mouth daily.   budesonide 0.5 MG/2ML nebulizer solution Commonly known as: PULMICORT USE 1 VIAL IN NEBULIZER IN THE MORNING AND AT BEDTIME What changed: See the new instructions.   chlorpheniramine-HYDROcodone 10-8 MG/5ML Suer Commonly known as: TUSSIONEX Take 5 mLs by mouth every 12 (twelve) hours as needed for cough.   diltiazem 240 MG 24 hr capsule Commonly known as: CARDIZEM CD Take  1 capsule (240 mg total) by mouth daily.   divalproex 500 MG 24 hr tablet Commonly known as: Depakote ER Take 1 tablet (500 mg total) by mouth at bedtime.   EQ Probiotic Caps Take 1 capsule by mouth every morning.   esomeprazole 40 MG capsule Commonly known as: NEXIUM Take 40 mg by mouth 2 (two) times daily before a meal.   fluticasone 50 MCG/ACT nasal spray Commonly known as: FLONASE Place 2 sprays into both nostrils daily as needed for allergies.   formoterol 20 MCG/2ML nebulizer solution Commonly known as: Perforomist USE 1 VIAL  IN  NEBULIZER TWICE  DAILY - morning and evening What changed:  how much to take when to take this additional instructions   hydrALAZINE 100 MG tablet Commonly known as: APRESOLINE Take 1 tablet (100 mg total) by mouth 3 (three) times daily. What changed:  medication strength how much to take when to take this   ipratropium-albuterol 0.5-2.5 (3) MG/3ML  Soln Commonly known as: DUONEB Take 3 mLs by nebulization every 8 (eight) hours as needed (SOB).   latanoprost 0.005 % ophthalmic solution Commonly known as: XALATAN Place 1 drop into both eyes at bedtime.   loratadine 10 MG tablet Commonly known as: CLARITIN Take 1 tablet (10 mg total) by mouth daily. Start taking on: April 11, 2021   memantine 10 MG tablet Commonly known as: NAMENDA Take 1 tablet (10 mg total) by mouth 2 (two) times daily.   METAMUCIL PO Take 1 capsule by mouth every morning.   metFORMIN 500 MG tablet Commonly known as: GLUCOPHAGE Take 500 mg by mouth 2 (two) times daily with a meal.   montelukast 10 MG tablet Commonly known as: SINGULAIR Take 1 tablet (10 mg total) by mouth at bedtime.   multivitamin with minerals Tabs tablet Take 1 tablet by mouth every morning.   nebivolol 10 MG tablet Commonly known as: BYSTOLIC Take 1 tablet (10 mg total) by mouth daily.   nitroGLYCERIN 0.4 MG SL tablet Commonly known as: NITROSTAT DISSOLVE ONE TABLET UNDER THE TONGUE EVERY 5 MINUTES AS NEEDED FOR CHEST PAIN.  DO NOT EXCEED A TOTAL OF 3 DOSES IN 15 MINUTES What changed: See the new instructions.   potassium chloride SA 20 MEQ tablet Commonly known as: KLOR-CON M Take 20 mEq by mouth every morning.   predniSONE 10 MG tablet Commonly known as: DELTASONE Take 30 mg daily for 1 day, 20 mg daily for 1 days,10 mg daily for 1 day, then stop        Allergies  Allergen Reactions   Tramadol Other (See Comments)    Seizure d/o      Consultations:  None   Other Procedures/Studies: DG Chest 2 View  Result Date: 04/01/2021 CLINICAL DATA:  Cough and wheezing. EXAM: CHEST - 2 VIEW COMPARISON:  01/23/2021 FINDINGS: The heart size and mediastinal contours are within normal limits. Both lungs are clear. The visualized skeletal structures are unremarkable. IMPRESSION: No active cardiopulmonary disease. Electronically Signed   By: Kerby Moors M.D.   On:  04/01/2021 07:21   DG Chest Port 1V same Day  Result Date: 04/08/2021 CLINICAL DATA:  Shortness of breath EXAM: PORTABLE CHEST 1 VIEW COMPARISON:  Previous studies including the examination of 04/01/2021 FINDINGS: Transverse diameter of heart is increased. Thoracic aorta is tortuous and ectatic. There are no signs of alveolar pulmonary edema or focal pulmonary consolidation. There are small linear densities in the lower lung fields, more so on the left side. There  is no pleural effusion or pneumothorax. IMPRESSION: There are no signs of pulmonary edema or focal pulmonary consolidation. Small linear densities seen in the lower lung fields, more so on the left side suggesting subsegmental atelectasis. Electronically Signed   By: Elmer Picker M.D.   On: 04/08/2021 09:12     TODAY-DAY OF DISCHARGE:  Subjective:   [name] today has no headache,no chest abdominal pain,no new weakness tingling or numbness, feels much better wants to go home today.   Objective:   Blood pressure (!) 162/73, pulse 88, temperature 98.4 F (36.9 C), temperature source Oral, resp. rate 18, height 5\' 1"  (1.549 m), weight 73.2 kg, SpO2 98 %.  Intake/Output Summary (Last 24 hours) at 04/10/2021 1042 Last data filed at 04/10/2021 0754 Gross per 24 hour  Intake 480 ml  Output --  Net 480 ml   Filed Weights   04/04/21 0500 04/04/21 0600 04/09/21 0500  Weight: 72.6 kg 73.5 kg 73.2 kg    Exam: Awake Alert, Oriented *3, No new F.N deficits, Normal affect [place].AT,PERRAL Supple Neck,No JVD, No cervical lymphadenopathy appriciated.  Symmetrical Chest wall movement, Good air movement bilaterally, CTAB-with the exception of some scattered rhonchi. RRR,No Gallops,Rubs or new Murmurs, No Parasternal Heave +ve B.Sounds, Abd Soft, Non tender, No organomegaly appriciated, No rebound -guarding or rigidity. No Cyanosis, Clubbing or edema, No new Rash or bruise   PERTINENT RADIOLOGIC STUDIES: No results  found.   PERTINENT LAB RESULTS: CBC: No results for input(s): WBC, HGB, HCT, PLT in the last 72 hours. CMET CMP     Component Value Date/Time   NA 131 (L) 04/09/2021 0119   NA 141 02/26/2020 1350   K 3.5 04/09/2021 0119   CL 89 (L) 04/09/2021 0119   CO2 31 04/09/2021 0119   GLUCOSE 240 (H) 04/09/2021 0119   BUN 50 (H) 04/09/2021 0119   BUN 31 (H) 02/26/2020 1350   CREATININE 1.58 (H) 04/09/2021 0119   CREATININE 1.06 (H) 01/26/2016 1608   CALCIUM 8.7 (L) 04/09/2021 0119   PROT 6.5 06/19/2020 0244   PROT 7.6 05/17/2017 1141   ALBUMIN 3.4 (L) 06/19/2020 0244   ALBUMIN 4.3 05/17/2017 1141   AST 20 06/19/2020 0244   ALT 20 06/19/2020 0244   ALKPHOS 43 06/19/2020 0244   BILITOT 0.6 06/19/2020 0244   BILITOT 0.4 05/17/2017 1141   GFRNONAA 35 (L) 04/09/2021 0119   GFRAA 50 (L) 02/26/2020 1350    GFR Estimated Creatinine Clearance: 29.5 mL/min (A) (by C-G formula based on SCr of 1.58 mg/dL (H)). No results for input(s): LIPASE, AMYLASE in the last 72 hours. No results for input(s): CKTOTAL, CKMB, CKMBINDEX, TROPONINI in the last 72 hours. Invalid input(s): POCBNP No results for input(s): DDIMER in the last 72 hours. No results for input(s): HGBA1C in the last 72 hours. No results for input(s): CHOL, HDL, LDLCALC, TRIG, CHOLHDL, LDLDIRECT in the last 72 hours. No results for input(s): TSH, T4TOTAL, T3FREE, THYROIDAB in the last 72 hours.  Invalid input(s): FREET3 No results for input(s): VITAMINB12, FOLATE, FERRITIN, TIBC, IRON, RETICCTPCT in the last 72 hours. Coags: No results for input(s): INR in the last 72 hours.  Invalid input(s): PT Microbiology: Recent Results (from the past 240 hour(s))  Resp Panel by RT-PCR (Flu A&B, Covid) Nasopharyngeal Swab     Status: None   Collection Time: 04/01/21  4:19 PM   Specimen: Nasopharyngeal Swab; Nasopharyngeal(NP) swabs in vial transport medium  Result Value Ref Range Status   SARS Coronavirus 2 by  RT PCR NEGATIVE NEGATIVE  Final    Comment: (NOTE) SARS-CoV-2 target nucleic acids are NOT DETECTED.  The SARS-CoV-2 RNA is generally detectable in upper respiratory specimens during the acute phase of infection. The lowest concentration of SARS-CoV-2 viral copies this assay can detect is 138 copies/mL. A negative result does not preclude SARS-Cov-2 infection and should not be used as the sole basis for treatment or other patient management decisions. A negative result may occur with  improper specimen collection/handling, submission of specimen other than nasopharyngeal swab, presence of viral mutation(s) within the areas targeted by this assay, and inadequate number of viral copies(<138 copies/mL). A negative result must be combined with clinical observations, patient history, and epidemiological information. The expected result is Negative.  Fact Sheet for Patients:  EntrepreneurPulse.com.au  Fact Sheet for Healthcare Providers:  IncredibleEmployment.be  This test is no t yet approved or cleared by the [place] FDA and  has been authorized for detection and/or diagnosis of SARS-CoV-2 by FDA under an Emergency Use Authorization (EUA). This EUA will remain  in effect (meaning this test can be used) for the duration of the COVID-19 declaration under Section 564(b)(1) of the Act, 21 U.S.C.section 360bbb-3(b)(1), unless the authorization is terminated  or revoked sooner.       Influenza A by PCR NEGATIVE NEGATIVE Final   Influenza B by PCR NEGATIVE NEGATIVE Final    Comment: (NOTE) The Xpert Xpress SARS-CoV-2/FLU/RSV plus assay is intended as an aid in the diagnosis of influenza from Nasopharyngeal swab specimens and should not be used as a sole basis for treatment. Nasal washings and aspirates are unacceptable for Xpert Xpress SARS-CoV-2/FLU/RSV testing.  Fact Sheet for Patients: EntrepreneurPulse.com.au  Fact Sheet for Healthcare  Providers: IncredibleEmployment.be  This test is not yet approved or cleared by the [place] FDA and has been authorized for detection and/or diagnosis of SARS-CoV-2 by FDA under an Emergency Use Authorization (EUA). This EUA will remain in effect (meaning this test can be used) for the duration of the COVID-19 declaration under Section 564(b)(1) of the Act, 21 U.S.C. section 360bbb-3(b)(1), unless the authorization is terminated or revoked.  Performed at [facility], [place] [address], [place], [place] [address]   Respiratory (~20 pathogens) panel by PCR     Status: Abnormal   Collection Time: 04/01/21  4:19 PM   Specimen: Nasopharyngeal Swab; Respiratory  Result Value Ref Range Status   Adenovirus NOT DETECTED NOT DETECTED Final   Coronavirus 229E NOT DETECTED NOT DETECTED Final    Comment: (NOTE) The Coronavirus on the Respiratory Panel, DOES NOT test for the novel  Coronavirus (2019 nCoV)    Coronavirus HKU1 NOT DETECTED NOT DETECTED Final   Coronavirus NL63 NOT DETECTED NOT DETECTED Final   Coronavirus OC43 NOT DETECTED NOT DETECTED Final   Metapneumovirus DETECTED (A) NOT DETECTED Final   Rhinovirus / Enterovirus NOT DETECTED NOT DETECTED Final   Influenza A NOT DETECTED NOT DETECTED Final   Influenza B NOT DETECTED NOT DETECTED Final   Parainfluenza Virus 1 NOT DETECTED NOT DETECTED Final   Parainfluenza Virus 2 NOT DETECTED NOT DETECTED Final   Parainfluenza Virus 3 NOT DETECTED NOT DETECTED Final   Parainfluenza Virus 4 NOT DETECTED NOT DETECTED Final   Respiratory Syncytial Virus NOT DETECTED NOT DETECTED Final   Bordetella pertussis NOT DETECTED NOT DETECTED Final   Bordetella Parapertussis NOT DETECTED NOT DETECTED Final   Chlamydophila pneumoniae NOT DETECTED NOT DETECTED Final   Mycoplasma pneumoniae NOT DETECTED NOT DETECTED  Final    Comment: Performed at [facility], [place] [address], [place],  [address]    FURTHER  DISCHARGE INSTRUCTIONS:  Get Medicines reviewed and adjusted: Please take all your medications with you for your next visit with your Primary MD  Laboratory/radiological data: Please request your Primary MD to go over all hospital tests and procedure/radiological results at the follow up, please ask your Primary MD to get all Hospital records sent to his/her office.  In some cases, they will be blood work, cultures and biopsy results pending at the time of your discharge. Please request that your primary care M.D. goes through all the records of your hospital data and follows up on these results.  Also Note the following: If you experience worsening of your admission symptoms, develop shortness of breath, life threatening emergency, suicidal or homicidal thoughts you must seek medical attention immediately by calling 911 or calling your MD immediately  if symptoms less severe.  You must read complete instructions/literature along with all the possible adverse reactions/side effects for all the Medicines you take and that have been prescribed to you. Take any new Medicines after you have completely understood and accpet all the possible adverse reactions/side effects.   Do not drive when taking Pain medications or sleeping medications (Benzodaizepines)  Do not take more than prescribed Pain, Sleep and Anxiety Medications. It is not advisable to combine anxiety,sleep and pain medications without talking with your primary care practitioner  Special Instructions: If you have smoked or chewed Tobacco  in the last 2 yrs please stop smoking, stop any regular Alcohol  and or any Recreational drug use.  Wear Seat belts while driving.  Please note: You were cared for by a hospitalist during your hospital stay. Once you are discharged, your primary care physician will handle any further medical issues. Please note that NO REFILLS for any discharge medications will be authorized once you are discharged,  as it is imperative that you return to your primary care physician (or establish a relationship with a primary care physician if you do not have one) for your post hospital discharge needs so that they can reassess your need for medications and monitor your lab values.  Total Time spent coordinating discharge including counseling, education and face to face time equals 35 minutes.  SignedOren Binet 04/10/2021 10:42 AM

## 2021-04-10 NOTE — Progress Notes (Signed)
Nsg Discharge Note  Admit Date:  04/01/2021 Discharge date: 04/10/2021   [name] to be D/C'd Home per MD order.  AVS completed.    Discharge Medication: Allergies as of 04/10/2021       Reactions   Tramadol Other (See Comments)   Seizure d/o        Medication List     STOP taking these medications    chlorthalidone 25 MG tablet Commonly known as: HYGROTON       TAKE these medications    albuterol 108 (90 Base) MCG/ACT inhaler Commonly known as: ProAir HFA Inhale 2 puffs into the lungs every 4 (four) hours as needed for wheezing or shortness of breath.   ALPRAZolam 0.5 MG tablet Commonly known as: XANAX Take 1 tablet (0.5 mg total) by mouth 2 (two) times daily as needed for anxiety. What changed: when to take this   atorvastatin 40 MG tablet Commonly known as: LIPITOR Take 40 mg by mouth at bedtime.   benzonatate 200 MG capsule Commonly known as: TESSALON Take 1 capsule (200 mg total) by mouth 3 (three) times daily.   Biotin 5000 MCG Caps Take 5,000 mcg by mouth daily. Notes to patient: Did not receive in hospital; resume as directed.   budesonide 0.5 MG/2ML nebulizer solution Commonly known as: PULMICORT USE 1 VIAL IN NEBULIZER IN THE MORNING AND AT BEDTIME What changed: See the new instructions.   chlorpheniramine-HYDROcodone 10-8 MG/5ML Suer Commonly known as: TUSSIONEX Take 5 mLs by mouth every 12 (twelve) hours as needed for cough.   diltiazem 240 MG 24 hr capsule Commonly known as: CARDIZEM CD Take 1 capsule (240 mg total) by mouth daily.   divalproex 500 MG 24 hr tablet Commonly known as: Depakote ER Take 1 tablet (500 mg total) by mouth at bedtime.   EQ Probiotic Caps Take 1 capsule by mouth every morning. Notes to patient: Did not receive in hospital; resume as directed.   esomeprazole 40 MG capsule Commonly known as: NEXIUM Take 40 mg by mouth 2 (two) times daily before a meal. Notes to patient: Did not receive in hospital;  resume as directed.   fluticasone 50 MCG/ACT nasal spray Commonly known as: FLONASE Place 2 sprays into both nostrils daily as needed for allergies.   formoterol 20 MCG/2ML nebulizer solution Commonly known as: Perforomist USE 1 VIAL  IN  NEBULIZER TWICE  DAILY - morning and evening What changed:  how much to take when to take this additional instructions Notes to patient: Did not receive in hospital; resume as directed.   hydrALAZINE 100 MG tablet Commonly known as: APRESOLINE Take 1 tablet (100 mg total) by mouth 3 (three) times daily. What changed:  medication strength how much to take when to take this   ipratropium-albuterol 0.5-2.5 (3) MG/3ML Soln Commonly known as: DUONEB Take 3 mLs by nebulization every 8 (eight) hours as needed (SOB).   latanoprost 0.005 % ophthalmic solution Commonly known as: XALATAN Place 1 drop into both eyes at bedtime. Notes to patient: Did not receive in hospital; resume as directed.   loratadine 10 MG tablet Commonly known as: CLARITIN Take 1 tablet (10 mg total) by mouth daily. Start taking on: April 11, 2021   memantine 10 MG tablet Commonly known as: NAMENDA Take 1 tablet (10 mg total) by mouth 2 (two) times daily.   METAMUCIL PO Take 1 capsule by mouth every morning. Notes to patient: Did not receive in hospital; resume as directed.   metFORMIN 500  MG tablet Commonly known as: GLUCOPHAGE Take 500 mg by mouth 2 (two) times daily with a meal. Notes to patient: Did not receive in hospital; resume as directed.   montelukast 10 MG tablet Commonly known as: SINGULAIR Take 1 tablet (10 mg total) by mouth at bedtime. Notes to patient: Did not receive in hospital; resume as directed.   multivitamin with minerals Tabs tablet Take 1 tablet by mouth every morning. Notes to patient: Did not receive in hospital; resume as directed.   nebivolol 10 MG tablet Commonly known as: BYSTOLIC Take 1 tablet (10 mg total) by mouth daily.    nitroGLYCERIN 0.4 MG SL tablet Commonly known as: NITROSTAT DISSOLVE ONE TABLET UNDER THE TONGUE EVERY 5 MINUTES AS NEEDED FOR CHEST PAIN.  DO NOT EXCEED A TOTAL OF 3 DOSES IN 15 MINUTES What changed: See the new instructions. Notes to patient: Did not receive in hospital; resume as directed.   potassium chloride SA 20 MEQ tablet Commonly known as: KLOR-CON M Take 20 mEq by mouth every morning. Notes to patient: Did not receive in hospital; resume as directed.   predniSONE 10 MG tablet Commonly known as: DELTASONE Take 30 mg daily for 1 day, 20 mg daily for 1 days,10 mg daily for 1 day, then stop        Discharge Assessment: Vitals:   04/10/21 0550 04/10/21 0722  BP: (!) 155/67 (!) 162/73  Pulse: 83 88  Resp:  18  Temp:  98.4 F (36.9 C)  SpO2:     Skin clean, dry and intact without evidence of skin break down, no evidence of skin tears noted. IV catheter discontinued intact. Site without signs and symptoms of complications - no redness or edema noted at insertion site, patient denies c/o pain - only slight tenderness at site.  Dressing with slight pressure applied.  D/c Instructions-Education: Discharge instructions given to patient/family with verbalized understanding. D/c education completed with patient/family including follow up instructions, medication list, d/c activities limitations if indicated, with other d/c instructions as indicated by MD - patient able to verbalize understanding, all questions fully answered. Patient instructed to return to ED, call 911, or call MD for any changes in condition.  Patient escorted via [place], and D/C home via private auto.  Hiram Comber, RN 04/10/2021 1:08 PM

## 2021-04-10 NOTE — Care Management (Signed)
Verified patient has nebulizer, no other TOC needs identified

## 2021-04-18 ENCOUNTER — Other Ambulatory Visit: Payer: Self-pay | Admitting: Cardiology

## 2021-04-20 ENCOUNTER — Other Ambulatory Visit: Payer: Self-pay

## 2021-04-20 ENCOUNTER — Encounter: Payer: Self-pay | Admitting: Adult Health

## 2021-04-20 ENCOUNTER — Ambulatory Visit (INDEPENDENT_AMBULATORY_CARE_PROVIDER_SITE_OTHER): Payer: Medicare Other | Admitting: Adult Health

## 2021-04-20 DIAGNOSIS — K029 Dental caries, unspecified: Secondary | ICD-10-CM | POA: Diagnosis not present

## 2021-04-20 DIAGNOSIS — J4541 Moderate persistent asthma with (acute) exacerbation: Secondary | ICD-10-CM | POA: Diagnosis not present

## 2021-04-20 MED ORDER — ALBUTEROL SULFATE HFA 108 (90 BASE) MCG/ACT IN AERS
2.0000 | INHALATION_SPRAY | RESPIRATORY_TRACT | 2 refills | Status: DC | PRN
Start: 2021-04-20 — End: 2022-09-26

## 2021-04-20 MED ORDER — PREDNISONE 20 MG PO TABS: ORAL_TABLET | ORAL | 0 refills | Status: DC

## 2021-04-20 NOTE — Assessment & Plan Note (Signed)
Dental follow up recommended.

## 2021-04-20 NOTE — Progress Notes (Signed)
@Patient  ID: [name], female    DOB: 01-06-1951, 70 y.o.   MRN: [id number]  Chief Complaint  Patient presents with   Follow-up    Referring provider: [name], MD  HPI: 70 year old female former smoker followed for asthma and chronic cough Medical history significant for coronary artery disease, diabetes, diastolic heart failure, seizure disorder   TEST/EVENTS :  FEN0 01/24/2016 = 13 with active symptoms on symb 160 2bid > changed to dulera 100  - Spirometry 02/23/2016  NO signifiant obstruction with active symptoms  - FENO 02/23/2016  =  12   On dulera 100 2bid (unable to confirm adeherence as did not bring it - Singulair 10 mg daily added 02/23/2016 to dulera 100 2bid with 2 week sample only  (unable to verify she used it) - FENO 04/07/2016  =   43 -CTa CHest 2017  >neg PE . 4 mm RUL nodule  CT sinus 2016 neg GEN IgE 87 2017, positive [place] grass, dust, tree High-resolution CT chest April 2018--NEG  for ILD, stable 4 mm right upper lobe nodule. Echo 07/2017 EF 65%, Gr 2 DD , PAP 21mmHg  Spirometry 08/2017 Moderate restriction , no obstruction practice ~  04/20/2021 Follow up: Asthma, post hospital follow-up Patient returns for a 1 week post hospital follow-up.  Patient was admitted last week for a asthma exacerbation.  She tested positive for metapneumovirus.  She was treated with IV steroids and nebulized bronchodilators.  Chest x-ray showed no acute process.  Some atelectatic changes in the bases. She remains on Pulmicort and Perforomist nebulizer twice daily.  She has DuoNeb to use as needed.  Since discharge patient says she is some better but continues to have ongoing minimally productive cough and wheezing . Taking OTC cough meds with some help  Appetite is good. No n/v.d.  O2 sats today 100% on room air '    Allergies  Allergen Reactions   Tramadol Other (See Comments)    Seizure d/o    Immunization History  Administered Date(s) Administered   Fluad  Quad(high Dose 65+) 02/06/2020, 01/31/2021   Influenza Split 03/10/2012, 03/14/2015   Influenza, High Dose Seasonal PF 02/04/2017, 01/04/2018, 12/31/2018   Influenza-Unspecified 01/11/2016, 01/07/2019   PFIZER Comirnaty(Gray Top)Covid-19 Tri-Sucrose Vaccine 01/31/2021   PFIZER(Purple Top)SARS-COV-2 Vaccination 05/16/2019, 06/16/2019, 02/06/2020, 05/15/2020, 11/12/2020   Pneumococcal Conjugate-13 11/19/2014   Pneumococcal Polysaccharide-23 02/04/2002, 01/11/2011, 10/13/2020   Tdap 01/11/2011   Zoster Recombinat (Shingrix) 06/11/2017, 08/28/2017, 09/27/2017   Zoster, Live 07/10/2012    Past Medical History:  Diagnosis Date   Anxiety    Arthritis    Asthma    Chronic diastolic CHF (congestive heart failure) ([place]) 07/17/2016   Coronary artery disease    a. NSTEMI 12/2015 - LHC 01/18/16: s/p overlapping DESx2 to RCA, 10% ost-prox Cx,10% mLAD.   Dementia ([place])    Depression    Diabetes mellitus    Dyslipidemia    GERD (gastroesophageal reflux disease)    History of seizure    Hypertension    Hypertensive heart disease    NSTEMI (non-ST elevated myocardial infarction) ([place]) 12/2015   Stage 3 chronic kidney disease ([facility])     Tobacco History: Social History   Tobacco Use  Smoking Status Former   Packs/day: 1.00   Years: 30.00   Pack years: 30.00   Types: Cigarettes   Quit date: 01/09/2004   Years since quitting: 17.2  Smokeless Tobacco Never   Counseling given: Not Answered   Outpatient Medications Prior to Visit  Medication Sig Dispense Refill   ALPRAZolam (XANAX) 0.5 MG tablet Take 1 tablet (0.5 mg total) by mouth 2 (two) times daily as needed for anxiety. (Patient taking differently: Take 0.5 mg by mouth at bedtime.) 30 tablet 0   atorvastatin (LIPITOR) 40 MG tablet Take 40 mg by mouth at bedtime.     Biotin 5000 MCG CAPS Take 5,000 mcg by mouth daily.     budesonide (PULMICORT) 0.5 MG/2ML nebulizer solution USE 1 VIAL IN NEBULIZER IN THE MORNING AND AT BEDTIME (Patient  taking differently: Take 0.5 mg by nebulization 2 (two) times daily.) 120 mL 11   diltiazem (CARDIZEM CD) 240 MG 24 hr capsule Take 1 capsule (240 mg total) by mouth daily. 90 capsule 0   divalproex (DEPAKOTE ER) 500 MG 24 hr tablet Take 1 tablet (500 mg total) by mouth at bedtime. 90 tablet 1   esomeprazole (NEXIUM) 40 MG capsule Take 40 mg by mouth 2 (two) times daily before a meal.     fluticasone (FLONASE) 50 MCG/ACT nasal spray Place 2 sprays into both nostrils daily as needed for allergies.     formoterol (PERFOROMIST) 20 MCG/2ML nebulizer solution USE 1 VIAL  IN  NEBULIZER TWICE  DAILY - morning and evening (Patient taking differently: 20 mcg 2 (two) times daily.) 120 mL 11   hydrALAZINE (APRESOLINE) 100 MG tablet Take 1 tablet (100 mg total) by mouth 3 (three) times daily. 90 tablet 3   ipratropium-albuterol (DUONEB) 0.5-2.5 (3) MG/3ML SOLN Take 3 mLs by nebulization every 8 (eight) hours as needed (SOB). 360 mL 1   latanoprost (XALATAN) 0.005 % ophthalmic solution Place 1 drop into both eyes at bedtime.     memantine (NAMENDA) 10 MG tablet Take 1 tablet (10 mg total) by mouth 2 (two) times daily. 180 tablet 1   metFORMIN (GLUCOPHAGE) 500 MG tablet Take 500 mg by mouth 2 (two) times daily with a meal.     montelukast (SINGULAIR) 10 MG tablet Take 1 tablet (10 mg total) by mouth at bedtime. 30 tablet 5   Multiple Vitamin (MULTIVITAMIN WITH MINERALS) TABS tablet Take 1 tablet by mouth every morning.     nebivolol (BYSTOLIC) 10 MG tablet Take 1 tablet (10 mg total) by mouth daily. 34 tablet 11   nitroGLYCERIN (NITROSTAT) 0.4 MG SL tablet DISSOLVE ONE TABLET UNDER THE TONGUE EVERY 5 MINUTES AS NEEDED FOR CHEST PAIN.  DO NOT EXCEED A TOTAL OF 3 DOSES IN 15 MINUTES (Patient taking differently: 0.4 mg every 5 (five) minutes as needed for chest pain.) 25 tablet 0   potassium chloride SA (K-DUR,KLOR-CON) 20 MEQ tablet Take 20 mEq by mouth every morning.     Probiotic Product (EQ PROBIOTIC) CAPS Take 1  capsule by mouth every morning.     Psyllium (METAMUCIL PO) Take 1 capsule by mouth every morning.     albuterol (PROAIR HFA) 108 (90 Base) MCG/ACT inhaler Inhale 2 puffs into the lungs every 4 (four) hours as needed for wheezing or shortness of breath. 18 g 5   benzonatate (TESSALON) 200 MG capsule Take 1 capsule (200 mg total) by mouth 3 (three) times daily. (Patient not taking: Reported on 04/20/2021) 20 capsule 0   chlorpheniramine-HYDROcodone (TUSSIONEX) 10-8 MG/5ML SUER Take 5 mLs by mouth every 12 (twelve) hours as needed for cough. (Patient not taking: Reported on 04/20/2021) 140 mL 0   loratadine (CLARITIN) 10 MG tablet Take 1 tablet (10 mg total) by mouth daily. 15 tablet 0   predniSONE (DELTASONE)  10 MG tablet Take 30 mg daily for 1 day, 20 mg daily for 1 days,10 mg daily for 1 day, then stop 6 tablet 0   No facility-administered medications prior to visit.     Review of Systems:   Constitutional:   No  weight loss, night sweats,  Fevers, chills,  +fatigue, or  lassitude.  HEENT:   No headaches,  Difficulty swallowing,  Tooth/dental problems, or  Sore throat,                No sneezing, itching, ear ache,  +nasal congestion, post nasal drip,   CV:  No chest pain,  Orthopnea, PND, swelling in lower extremities, anasarca, dizziness, palpitations, syncope.   GI  No heartburn, indigestion, abdominal pain, nausea, vomiting, diarrhea, change in bowel habits, loss of appetite, bloody stools.   Resp:  No chest wall deformity  Skin: no rash or lesions.  GU: no dysuria, change in color of urine, no urgency or frequency.  No flank pain, no hematuria   MS:  No joint pain or swelling.  No decreased range of motion.  No back pain.    Physical Exam  BP (!) 144/62 (BP Location: Left Arm, Cuff Size: Large)    Pulse 76    Temp 98.6 F (37 C) (Temporal)    Ht 5\' 2"  (1.575 m)    Wt 167 lb 12.8 oz (76.1 kg)    LMP  (LMP Unknown)    SpO2 100%    BMI 30.69 kg/m   GEN: A/Ox3; pleasant ,  NAD, well nourished    HEENT:  [place]/AT,   NOSE-clear, THROAT-clear, no lesions, no postnasal drip or exudate noted.  Poor dentition   NECK:  Supple w/ fair ROM; no JVD; normal carotid impulses w/o bruits; no thyromegaly or nodules palpated; no lymphadenopathy.    RESP  few exp wheezes no  accessory muscle use, no dullness to percussion Speaks in full sentences   CARD:  RRR, no m/r/g, no peripheral edema, pulses intact, no cyanosis or clubbing.  GI:   Soft & nt; nml bowel sounds; no organomegaly or masses detected.   Musco: Warm bil, no deformities or joint swelling noted.   Neuro: alert, no focal deficits noted.    Skin: Warm, no lesions or rashes    Lab Results:  CBC   BMET     Imaging: DG Chest 2 View  Result Date: 04/01/2021 CLINICAL DATA:  Cough and wheezing. EXAM: CHEST - 2 VIEW COMPARISON:  01/23/2021 FINDINGS: The heart size and mediastinal contours are within normal limits. Both lungs are clear. The visualized skeletal structures are unremarkable. IMPRESSION: No active cardiopulmonary disease. Electronically Signed   By: Kerby Moors M.D.   On: 04/01/2021 07:21   DG Chest Port 1V same Day  Result Date: 04/08/2021 CLINICAL DATA:  Shortness of breath EXAM: PORTABLE CHEST 1 VIEW COMPARISON:  Previous studies including the examination of 04/01/2021 FINDINGS: Transverse diameter of heart is increased. Thoracic aorta is tortuous and ectatic. There are no signs of alveolar pulmonary edema or focal pulmonary consolidation. There are small linear densities in the lower lung fields, more so on the left side. There is no pleural effusion or pneumothorax. IMPRESSION: There are no signs of pulmonary edema or focal pulmonary consolidation. Small linear densities seen in the lower lung fields, more so on the left side suggesting subsegmental atelectasis. Electronically Signed   By: Elmer Picker M.D.   On: 04/08/2021 09:12      PFT Results  Latest Ref Rng & Units 04/18/2018   FVC-Pre L 1.19  FVC-Predicted Pre % 55  FVC-Post L 1.25  FVC-Predicted Post % 58  Pre FEV1/FVC % % 89  Post FEV1/FCV % % 91  FEV1-Pre L 1.05  FEV1-Predicted Pre % 64  FEV1-Post L 1.13  DLCO uncorrected ml/min/mmHg 11.21  DLCO UNC% % 53  DLCO corrected ml/min/mmHg 12.60  DLCO COR %Predicted % 60  DLVA Predicted % 147  TLC L 2.73  TLC % Predicted % 58  RV % Predicted % 64    Lab Results  Component Value Date   NITRICOXIDE 18 11/16/2017        Assessment & Plan:   Asthma exacerbation Slow to resolve flare -recent hospitalization with Metapneumovirus. Hospital discharge reviewed .  Improving but continued wheezing on exam  Will treat with slow steroid taper -steroid patient education given  Continue on maintenance regimen   Plan  Patient Instructions  Prednisone 20mg  for 5 days and 10mg  daily for 5 days  Delsym 2 tsp Twice daily  for cough As needed   Continue on Budesonide and Perforomist neb Twice daily   Begin Albuterol /Ipratropium Neb Twice daily  for 1 week then As needed   Activity as tolerated .  Dental follow up as discussed.  Follow up with Dr. Melvyn Novas  in 4-6 weeks and As needed   Please contact office for sooner follow up if symptoms do not improve or worsen or seek emergency care       Dental caries Dental follow up recommended.     I spent  40  minutes dedicated to the care of this patient on the date of this encounter to include pre-visit review of records, face-to-face time with the patient discussing conditions above, post visit ordering of testing, clinical documentation with the electronic health record, making appropriate referrals as documented, and communicating necessary findings to members of the patients care team.   Rexene Edison, NP 04/20/2021

## 2021-04-20 NOTE — Assessment & Plan Note (Addendum)
Slow to resolve flare -recent hospitalization with Metapneumovirus. Hospital discharge reviewed .  Improving but continued wheezing on exam  Will treat with slow steroid taper -steroid patient education given  Continue on maintenance regimen   Plan  Patient Instructions  Prednisone 20mg  for 5 days and 10mg  daily for 5 days  Delsym 2 tsp Twice daily  for cough As needed   Continue on Budesonide and Perforomist neb Twice daily   Begin Albuterol /Ipratropium Neb Twice daily  for 1 week then As needed   Activity as tolerated .  Dental follow up as discussed.  Follow up with Dr. Melvyn Novas  in 4-6 weeks and As needed   Please contact office for sooner follow up if symptoms do not improve or worsen or seek emergency care

## 2021-04-20 NOTE — Patient Instructions (Addendum)
Prednisone 20mg  for 5 days and 10mg  daily for 5 days  Delsym 2 tsp Twice daily  for cough As needed   Continue on Budesonide and Perforomist neb Twice daily   Begin Albuterol /Ipratropium Neb Twice daily  for 1 week then As needed   Activity as tolerated .  Dental follow up as discussed.  Follow up with Dr. Melvyn Novas  in 4-6 weeks and As needed   Please contact office for sooner follow up if symptoms do not improve or worsen or seek emergency care

## 2021-04-21 ENCOUNTER — Telehealth: Payer: Self-pay | Admitting: Adult Health

## 2021-04-22 ENCOUNTER — Emergency Department (HOSPITAL_BASED_OUTPATIENT_CLINIC_OR_DEPARTMENT_OTHER): Payer: Medicare Other

## 2021-04-22 ENCOUNTER — Inpatient Hospital Stay (HOSPITAL_BASED_OUTPATIENT_CLINIC_OR_DEPARTMENT_OTHER)
Admission: EM | Admit: 2021-04-22 | Discharge: 2021-04-28 | DRG: 202 | Disposition: A | Discharge status: Home / Self Care | Payer: Medicare Other | Attending: Internal Medicine | Admitting: Internal Medicine

## 2021-04-22 ENCOUNTER — Emergency Department (HOSPITAL_BASED_OUTPATIENT_CLINIC_OR_DEPARTMENT_OTHER): Payer: Medicare Other | Admitting: Radiology

## 2021-04-22 ENCOUNTER — Other Ambulatory Visit: Payer: Self-pay

## 2021-04-22 ENCOUNTER — Encounter (HOSPITAL_BASED_OUTPATIENT_CLINIC_OR_DEPARTMENT_OTHER): Payer: Self-pay | Admitting: *Deleted

## 2021-04-22 ENCOUNTER — Telehealth (INDEPENDENT_AMBULATORY_CARE_PROVIDER_SITE_OTHER): Payer: Medicare Other | Admitting: Adult Health

## 2021-04-22 DIAGNOSIS — I2510 Atherosclerotic heart disease of native coronary artery without angina pectoris: Secondary | ICD-10-CM | POA: Diagnosis present

## 2021-04-22 DIAGNOSIS — Z825 Family history of asthma and other chronic lower respiratory diseases: Secondary | ICD-10-CM

## 2021-04-22 DIAGNOSIS — J383 Other diseases of vocal cords: Secondary | ICD-10-CM | POA: Diagnosis present

## 2021-04-22 DIAGNOSIS — F32A Depression, unspecified: Secondary | ICD-10-CM | POA: Diagnosis present

## 2021-04-22 DIAGNOSIS — I10 Essential (primary) hypertension: Secondary | ICD-10-CM | POA: Diagnosis present

## 2021-04-22 DIAGNOSIS — J45901 Unspecified asthma with (acute) exacerbation: Secondary | ICD-10-CM | POA: Diagnosis present

## 2021-04-22 DIAGNOSIS — R609 Edema, unspecified: Secondary | ICD-10-CM | POA: Diagnosis not present

## 2021-04-22 DIAGNOSIS — E1165 Type 2 diabetes mellitus with hyperglycemia: Secondary | ICD-10-CM | POA: Diagnosis present

## 2021-04-22 DIAGNOSIS — E1169 Type 2 diabetes mellitus with other specified complication: Secondary | ICD-10-CM | POA: Diagnosis present

## 2021-04-22 DIAGNOSIS — F418 Other specified anxiety disorders: Secondary | ICD-10-CM | POA: Diagnosis present

## 2021-04-22 DIAGNOSIS — Z7951 Long term (current) use of inhaled steroids: Secondary | ICD-10-CM

## 2021-04-22 DIAGNOSIS — F419 Anxiety disorder, unspecified: Secondary | ICD-10-CM | POA: Diagnosis present

## 2021-04-22 DIAGNOSIS — N183 Chronic kidney disease, stage 3 unspecified: Secondary | ICD-10-CM

## 2021-04-22 DIAGNOSIS — T380X5A Adverse effect of glucocorticoids and synthetic analogues, initial encounter: Secondary | ICD-10-CM | POA: Diagnosis present

## 2021-04-22 DIAGNOSIS — K219 Gastro-esophageal reflux disease without esophagitis: Secondary | ICD-10-CM | POA: Diagnosis present

## 2021-04-22 DIAGNOSIS — J4541 Moderate persistent asthma with (acute) exacerbation: Secondary | ICD-10-CM

## 2021-04-22 DIAGNOSIS — Z833 Family history of diabetes mellitus: Secondary | ICD-10-CM

## 2021-04-22 DIAGNOSIS — M79605 Pain in left leg: Secondary | ICD-10-CM | POA: Diagnosis not present

## 2021-04-22 DIAGNOSIS — Z885 Allergy status to narcotic agent status: Secondary | ICD-10-CM | POA: Diagnosis not present

## 2021-04-22 DIAGNOSIS — E785 Hyperlipidemia, unspecified: Secondary | ICD-10-CM | POA: Diagnosis present

## 2021-04-22 DIAGNOSIS — D631 Anemia in chronic kidney disease: Secondary | ICD-10-CM | POA: Diagnosis present

## 2021-04-22 DIAGNOSIS — I130 Hypertensive heart and chronic kidney disease with heart failure and stage 1 through stage 4 chronic kidney disease, or unspecified chronic kidney disease: Secondary | ICD-10-CM | POA: Diagnosis present

## 2021-04-22 DIAGNOSIS — E119 Type 2 diabetes mellitus without complications: Secondary | ICD-10-CM | POA: Diagnosis not present

## 2021-04-22 DIAGNOSIS — I5032 Chronic diastolic (congestive) heart failure: Secondary | ICD-10-CM | POA: Diagnosis present

## 2021-04-22 DIAGNOSIS — M1990 Unspecified osteoarthritis, unspecified site: Secondary | ICD-10-CM | POA: Diagnosis present

## 2021-04-22 DIAGNOSIS — Z87891 Personal history of nicotine dependence: Secondary | ICD-10-CM | POA: Diagnosis not present

## 2021-04-22 DIAGNOSIS — R0902 Hypoxemia: Secondary | ICD-10-CM | POA: Diagnosis not present

## 2021-04-22 DIAGNOSIS — E1122 Type 2 diabetes mellitus with diabetic chronic kidney disease: Secondary | ICD-10-CM | POA: Diagnosis present

## 2021-04-22 DIAGNOSIS — I252 Old myocardial infarction: Secondary | ICD-10-CM

## 2021-04-22 DIAGNOSIS — Z955 Presence of coronary angioplasty implant and graft: Secondary | ICD-10-CM | POA: Diagnosis not present

## 2021-04-22 DIAGNOSIS — M79604 Pain in right leg: Secondary | ICD-10-CM | POA: Diagnosis not present

## 2021-04-22 DIAGNOSIS — Z888 Allergy status to other drugs, medicaments and biological substances status: Secondary | ICD-10-CM | POA: Diagnosis not present

## 2021-04-22 DIAGNOSIS — Z79899 Other long term (current) drug therapy: Secondary | ICD-10-CM

## 2021-04-22 DIAGNOSIS — Z20822 Contact with and (suspected) exposure to covid-19: Secondary | ICD-10-CM | POA: Diagnosis present

## 2021-04-22 DIAGNOSIS — N1831 Chronic kidney disease, stage 3a: Secondary | ICD-10-CM | POA: Diagnosis present

## 2021-04-22 DIAGNOSIS — Z8249 Family history of ischemic heart disease and other diseases of the circulatory system: Secondary | ICD-10-CM

## 2021-04-22 DIAGNOSIS — Z7984 Long term (current) use of oral hypoglycemic drugs: Secondary | ICD-10-CM

## 2021-04-22 DIAGNOSIS — J4551 Severe persistent asthma with (acute) exacerbation: Principal | ICD-10-CM | POA: Diagnosis present

## 2021-04-22 LAB — CBC WITH DIFFERENTIAL/PLATELET
Abs Immature Granulocytes: 0.11 10*3/uL — ABNORMAL HIGH (ref 0.00–0.07)
Basophils Absolute: 0.1 10*3/uL (ref 0.0–0.1)
Basophils Relative: 0 %
Eosinophils Absolute: 0.1 10*3/uL (ref 0.0–0.5)
Eosinophils Relative: 1 %
HCT: 32 % — ABNORMAL LOW (ref 36.0–46.0)
Hemoglobin: 10.4 g/dL — ABNORMAL LOW (ref 12.0–15.0)
Immature Granulocytes: 1 %
Lymphocytes Relative: 23 %
Lymphs Abs: 3.1 10*3/uL (ref 0.7–4.0)
MCH: 30.6 pg (ref 26.0–34.0)
MCHC: 32.5 g/dL (ref 30.0–36.0)
MCV: 94.1 fL (ref 80.0–100.0)
Monocytes Absolute: 1.1 10*3/uL — ABNORMAL HIGH (ref 0.1–1.0)
Monocytes Relative: 8 %
Neutro Abs: 9 10*3/uL — ABNORMAL HIGH (ref 1.7–7.7)
Neutrophils Relative %: 67 %
Platelets: 175 10*3/uL (ref 150–400)
RBC: 3.4 MIL/uL — ABNORMAL LOW (ref 3.87–5.11)
RDW: 14 % (ref 11.5–15.5)
WBC: 13.6 10*3/uL — ABNORMAL HIGH (ref 4.0–10.5)
nRBC: 0 % (ref 0.0–0.2)

## 2021-04-22 LAB — BASIC METABOLIC PANEL
Anion gap: 11 (ref 5–15)
BUN: 31 mg/dL — ABNORMAL HIGH (ref 8–23)
CO2: 28 mmol/L (ref 22–32)
Calcium: 8.3 mg/dL — ABNORMAL LOW (ref 8.9–10.3)
Chloride: 98 mmol/L (ref 98–111)
Creatinine, Ser: 1.44 mg/dL — ABNORMAL HIGH (ref 0.44–1.00)
GFR, Estimated: 39 mL/min — ABNORMAL LOW (ref >60)
Glucose, Bld: 126 mg/dL — ABNORMAL HIGH (ref 70–99)
Potassium: 4 mmol/L (ref 3.5–5.1)
Sodium: 137 mmol/L (ref 135–145)

## 2021-04-22 LAB — RESP PANEL BY RT-PCR (FLU A&B, COVID) ARPGX2
Influenza A by PCR: NEGATIVE
Influenza B by PCR: NEGATIVE
SARS Coronavirus 2 by RT PCR: NEGATIVE

## 2021-04-22 MED ORDER — ACETAMINOPHEN 325 MG PO TABS
650.0000 mg | ORAL_TABLET | Freq: Four times a day (QID) | ORAL | Status: DC | PRN
  Administered 2021-04-23 – 2021-04-25 (×4): 650 mg via ORAL
  Filled 2021-04-22 (×4): qty 2

## 2021-04-22 MED ORDER — IPRATROPIUM-ALBUTEROL 0.5-2.5 (3) MG/3ML IN SOLN
3.0000 mL | Freq: Once | RESPIRATORY_TRACT | Status: AC
  Administered 2021-04-22: 15:00:00 3 mL via RESPIRATORY_TRACT
  Filled 2021-04-22: qty 3

## 2021-04-22 MED ORDER — ATORVASTATIN CALCIUM 40 MG PO TABS
40.0000 mg | ORAL_TABLET | Freq: Every day | ORAL | Status: DC
  Administered 2021-04-22 – 2021-04-27 (×6): 40 mg via ORAL
  Filled 2021-04-22 (×6): qty 1

## 2021-04-22 MED ORDER — ONDANSETRON HCL 4 MG PO TABS: 4.0000 mg | ORAL_TABLET | Freq: Four times a day (QID) | ORAL | Status: DC | PRN

## 2021-04-22 MED ORDER — DILTIAZEM HCL ER COATED BEADS 240 MG PO CP24
240.0000 mg | ORAL_CAPSULE | Freq: Every day | ORAL | Status: DC
  Administered 2021-04-23 – 2021-04-28 (×6): 240 mg via ORAL
  Filled 2021-04-22 (×6): qty 1

## 2021-04-22 MED ORDER — ALPRAZOLAM 0.5 MG PO TABS
0.5000 mg | ORAL_TABLET | Freq: Two times a day (BID) | ORAL | Status: DC | PRN
  Administered 2021-04-22 – 2021-04-27 (×5): 0.5 mg via ORAL
  Filled 2021-04-22 (×5): qty 1

## 2021-04-22 MED ORDER — IPRATROPIUM-ALBUTEROL 0.5-2.5 (3) MG/3ML IN SOLN
3.0000 mL | Freq: Four times a day (QID) | RESPIRATORY_TRACT | Status: DC
  Administered 2021-04-23 – 2021-04-24 (×8): 3 mL via RESPIRATORY_TRACT
  Filled 2021-04-22 (×8): qty 3

## 2021-04-22 MED ORDER — ACETAMINOPHEN 500 MG PO TABS
1000.0000 mg | ORAL_TABLET | Freq: Once | ORAL | Status: AC
  Administered 2021-04-22: 13:00:00 1000 mg via ORAL
  Filled 2021-04-22: qty 2

## 2021-04-22 MED ORDER — IPRATROPIUM-ALBUTEROL 0.5-2.5 (3) MG/3ML IN SOLN
RESPIRATORY_TRACT | Status: AC
  Administered 2021-04-22: 11:00:00 3 mL
  Filled 2021-04-22: qty 3

## 2021-04-22 MED ORDER — METHYLPREDNISOLONE SODIUM SUCC 125 MG IJ SOLR
60.0000 mg | Freq: Two times a day (BID) | INTRAMUSCULAR | Status: DC
  Administered 2021-04-22 – 2021-04-23 (×2): 60 mg via INTRAVENOUS
  Filled 2021-04-22 (×2): qty 2

## 2021-04-22 MED ORDER — PREDNISONE 10 MG PO TABS: ORAL_TABLET | ORAL | 0 refills | Status: DC

## 2021-04-22 MED ORDER — MELATONIN 5 MG PO TABS
10.0000 mg | ORAL_TABLET | Freq: Every evening | ORAL | Status: DC | PRN
  Administered 2021-04-22 – 2021-04-27 (×6): 10 mg via ORAL
  Filled 2021-04-22 (×6): qty 2

## 2021-04-22 MED ORDER — ENOXAPARIN SODIUM 40 MG/0.4ML IJ SOSY
40.0000 mg | PREFILLED_SYRINGE | INTRAMUSCULAR | Status: DC
  Administered 2021-04-22 – 2021-04-27 (×6): 40 mg via SUBCUTANEOUS
  Filled 2021-04-22 (×6): qty 0.4

## 2021-04-22 MED ORDER — NEBIVOLOL HCL 10 MG PO TABS
10.0000 mg | ORAL_TABLET | Freq: Every day | ORAL | Status: DC
  Administered 2021-04-23 – 2021-04-28 (×6): 10 mg via ORAL
  Filled 2021-04-22 (×6): qty 1

## 2021-04-22 MED ORDER — METHYLPREDNISOLONE SODIUM SUCC 125 MG IJ SOLR
125.0000 mg | Freq: Once | INTRAMUSCULAR | Status: AC
  Administered 2021-04-22: 12:00:00 125 mg via INTRAVENOUS
  Filled 2021-04-22: qty 2

## 2021-04-22 MED ORDER — FENTANYL CITRATE PF 50 MCG/ML IJ SOSY
50.0000 ug | PREFILLED_SYRINGE | Freq: Once | INTRAMUSCULAR | Status: AC
  Administered 2021-04-22: 18:00:00 50 ug via INTRAVENOUS
  Filled 2021-04-22: qty 1

## 2021-04-22 MED ORDER — ACETAMINOPHEN 650 MG RE SUPP: 650.0000 mg | Freq: Four times a day (QID) | RECTAL | Status: DC | PRN

## 2021-04-22 MED ORDER — ALBUTEROL (5 MG/ML) CONTINUOUS INHALATION SOLN
10.0000 mg/h | INHALATION_SOLUTION | Freq: Once | RESPIRATORY_TRACT | Status: AC
  Administered 2021-04-22: 12:00:00 10 mg/h via RESPIRATORY_TRACT
  Filled 2021-04-22: qty 20

## 2021-04-22 MED ORDER — HYDROCODONE BIT-HOMATROP MBR 5-1.5 MG/5ML PO SOLN
5.0000 mL | ORAL | Status: DC | PRN
  Administered 2021-04-22 – 2021-04-23 (×2): 5 mL via ORAL
  Filled 2021-04-22 (×2): qty 5

## 2021-04-22 MED ORDER — MONTELUKAST SODIUM 10 MG PO TABS
10.0000 mg | ORAL_TABLET | Freq: Every day | ORAL | Status: DC
  Administered 2021-04-22 – 2021-04-27 (×6): 10 mg via ORAL
  Filled 2021-04-22 (×6): qty 1

## 2021-04-22 MED ORDER — ONDANSETRON HCL 4 MG/2ML IJ SOLN: 4.0000 mg | Freq: Four times a day (QID) | INTRAMUSCULAR | Status: DC | PRN

## 2021-04-22 MED ORDER — ALBUTEROL SULFATE (2.5 MG/3ML) 0.083% IN NEBU
2.5000 mg | INHALATION_SOLUTION | RESPIRATORY_TRACT | Status: DC | PRN
  Administered 2021-04-23: 10:00:00 2.5 mg via RESPIRATORY_TRACT
  Filled 2021-04-22: qty 3

## 2021-04-22 MED ORDER — DIVALPROEX SODIUM ER 500 MG PO TB24
500.0000 mg | ORAL_TABLET | Freq: Every day | ORAL | Status: DC
  Administered 2021-04-22 – 2021-04-27 (×6): 500 mg via ORAL
  Filled 2021-04-22 (×6): qty 1

## 2021-04-22 MED ORDER — HYDRALAZINE HCL 50 MG PO TABS
50.0000 mg | ORAL_TABLET | Freq: Three times a day (TID) | ORAL | Status: DC
  Administered 2021-04-22 – 2021-04-25 (×10): 50 mg via ORAL
  Filled 2021-04-22 (×10): qty 1

## 2021-04-22 MED ORDER — PANTOPRAZOLE SODIUM 40 MG PO TBEC
40.0000 mg | DELAYED_RELEASE_TABLET | Freq: Every day | ORAL | Status: DC
  Administered 2021-04-23 – 2021-04-28 (×6): 40 mg via ORAL
  Filled 2021-04-22 (×6): qty 1

## 2021-04-22 MED ORDER — MEMANTINE HCL 10 MG PO TABS
10.0000 mg | ORAL_TABLET | Freq: Two times a day (BID) | ORAL | Status: DC
  Administered 2021-04-22 – 2021-04-28 (×12): 10 mg via ORAL
  Filled 2021-04-22 (×12): qty 1

## 2021-04-22 MED ORDER — HYDRALAZINE HCL 100 MG PO TABS: 100.0000 mg | ORAL_TABLET | Freq: Three times a day (TID) | ORAL | 0 refills | Status: DC

## 2021-04-22 NOTE — Plan of Care (Signed)
[facility] plan of care note  Called for direct admission by Dr Dina Rich for 70 y/o female at [facility] who was admitted 04/01/21 with Metapneumovirus and asthma exacerbation and discharged on 12/11 who has presented with wheezing, tachypnea, no hypoxia and normal CXR. She was on a Prednisone taper but still wheezing when evaluated on 12/21 by pulmonary. Today she was re-evaluated and still wheezing. She was sent form the pulmonary office. COVID pending. She has received IV Solumedrol and Nebs. Has persistent wheezing and tachypnea.  Will place in observation for asthma exacerbation.    Debbe Odea, MD

## 2021-04-22 NOTE — Assessment & Plan Note (Signed)
Stable

## 2021-04-22 NOTE — Patient Instructions (Signed)
Recommended to go to the emergency room as we discussed for further evaluation and treatment as your asthma flare seems to be getting worse..   New prednisone taper sent to your pharmacy Begin Robitussin DM 2 teaspoons every 4 hours as needed for cough Change Claritin to Zyrtec 10 mg at bedtime Continue on Budesonide and Perforomist neb Twice daily   Begin Albuterol /Ipratropium Neb Twice daily  for 1 week then As needed   Activity as tolerated .  Dental follow up as discussed.  Follow up with Dr. Melvyn Novas  in 1 week and As needed   Please contact office for sooner follow up if symptoms do not improve or worsen or seek emergency care

## 2021-04-22 NOTE — Assessment & Plan Note (Signed)
Stable.  Patient euvolemic. 

## 2021-04-22 NOTE — ED Notes (Signed)
[facility] called to consult Hospitalist for admission.

## 2021-04-22 NOTE — ED Notes (Signed)
[facility] notified no return call from Hospitalist, they are paging the hospitalist again.

## 2021-04-22 NOTE — ED Provider Notes (Signed)
[facility] EMERGENC[facility] DEPT Provider Note   CSN: [id number] Arrival date & time: 04/22/21  1020     History Chief Complaint  Patient presents with   Shortness of Breath    [name] is a 70 y.o. female.  HPI  70 year old female with past medical history of CAD, CHF, asthma, chronic cough who is followed by pulmonology with recent admission for asthma exacerbation secondary to metapneumovirus presents to the emergency department with ongoing shortness of breath, cough and wheezing.  Patient was discharged about a week ago.  While admitted she was treated with breathing treatments, steroids.  Sent home with steroids and nebulizer.  She states she been using her home medications without any significant relief.  Patient presents today with worsening shortness of breath and wheezing.  Denies any chest pain or back pain.  No swelling of her lower extremities.  No fever.  States she is otherwise been compliant with her treatment.  She called her pulmonologist this morning and did a virtual evaluation and was sent to the ER.  Past Medical History:  Diagnosis Date   Anxiety    Arthritis    Asthma    Chronic diastolic CHF (congestive heart failure) ([place]) 07/17/2016   Coronary artery disease    a. NSTEMI 12/2015 - LHC 01/18/16: s/p overlapping DESx2 to RCA, 10% ost-prox Cx,10% mLAD.   Dementia ([place])    Depression    Diabetes mellitus    Dyslipidemia    GERD (gastroesophageal reflux disease)    History of seizure    Hypertension    Hypertensive heart disease    NSTEMI (non-ST elevated myocardial infarction) ([place]) 12/2015   Stage 3 chronic kidney disease ([place])     Patient Active Problem List   Diagnosis Date Noted   Dental caries 04/20/2021   Abnormal swallowing 03/29/2021   Acute non-ST segment elevation myocardial infarction ([place]) 03/29/2021   Chronic diarrhea 03/29/2021   Daytime somnolence 03/29/2021   Abnormal mammogram of left breast 12/13/2020   Acute  respiratory disease due to COVID-19 virus 06/16/2020   Wheezing 09/13/2019   Memory loss 03/20/2019   Severe persistent asthma without complication 78/58/8502   DM2 (diabetes mellitus, type 2) ([place]) 03/15/2018   CKD (chronic kidney disease), stage III ([place]) 03/15/2018   Dyspnea 08/12/2017   Macrocytic anemia 08/12/2017   Therapeutic drug monitoring 05/17/2017   Lung nodule 08/01/2016   Influenza B 07/18/2016   Chronic diastolic CHF (congestive heart failure) ([place]) 07/17/2016   Acute renal failure superimposed on stage 3 chronic kidney disease ([place]) 07/17/2016   Right leg swelling 02/27/2016   Acute respiratory failure with hypoxia ([facility])    Uncontrolled type 2 diabetes mellitus with complication    Acute pulmonary embolism ([facility])    HLD (hyperlipidemia)    Seizures ([place]) 01/27/2016   Numbness 01/27/2016   CAD in native artery 01/20/2016   Hypertensive heart disease 01/20/2016   NSTEMI (non-ST elevated myocardial infarction) ([place]) 01/18/2016   Acute bronchitis with asthma with acute exacerbation 01/16/2016   Hypokalemia 01/16/2016   Hypomagnesemia 01/16/2016   Acute respiratory failure ([place]) 01/16/2016   Dyspnea on exertion    Asthma exacerbation 07/21/2015   Upper airway cough syndrome with VCD 08/28/2014   Diabetes mellitus type 2, controlled ([place]) 08/06/2014   Hyperlipidemia 08/06/2014   Allergic rhinitis 07/22/2008   OTHER DISEASES OF VOCAL CORDS 07/22/2008   DYSPNEA 07/22/2008   Diabetes mellitus type 2, uncontrolled 07/21/2008   DIABETIC PERIPHERAL NEUROPATHY 07/21/2008   Depression with anxiety  07/21/2008   Essential hypertension 07/21/2008   Mild persistent chronic asthma without complication 19/14/7829   G E R D 07/21/2008   PULMONARY EMBOLISM, HX OF 07/21/2008    Past Surgical History:  Procedure Laterality Date   ABDOMINAL HYSTERECTOMY     CARDIAC CATHETERIZATION  1994   minimal LAD dz, no other dz, EF normal   CARDIAC CATHETERIZATION N/A 01/18/2016    Procedure: Left Heart Cath and Coronary Angiography;  Surgeon: Burnell Blanks, MD;  Location: [facility];  Service: Cardiovascular;  Laterality: N/A;   CARDIAC CATHETERIZATION N/A 01/18/2016   Procedure: Coronary Stent Intervention;  Surgeon: Burnell Blanks, MD;  Location: [facility];  Service: Cardiovascular;  Laterality: N/A;   COLONOSCOPY WITH PROPOFOL N/A 04/09/2014   Procedure: COLONOSCOPY WITH PROPOFOL;  Surgeon: Cleotis Nipper, MD;  Location: [facility];  Service: Endoscopy;  Laterality: N/A;   CORONARY STENT PLACEMENT  01/19/2016   STENT SYNERGY DES [id number] drug eluting stent was successfully placed, and overlaps the 2.5 x 38 mm Synergy stent placed distally.   ESOPHAGOGASTRODUODENOSCOPY (EGD) WITH PROPOFOL N/A 04/09/2014   Procedure: ESOPHAGOGASTRODUODENOSCOPY (EGD) WITH PROPOFOL;  Surgeon: Cleotis Nipper, MD;  Location: [facility];  Service: Endoscopy;  Laterality: N/A;   FOOT SURGERY Bilateral    approx ~2000   HEMORRHOID SURGERY     HERNIA REPAIR     KNEE ARTHROSCOPY     RADIOACTIVE SEED GUIDED EXCISIONAL BREAST BIOPSY Left 01/20/2021   Procedure: RADIOACTIVE SEED GUIDED EXCISIONAL LEFT BREAST BIOPSY;  Surgeon: Stark Klein, MD;  Location: [facility];  Service: General;  Laterality: Left;     OB History   No obstetric history on file.     Family History  Problem Relation Age of Onset   Allergies Mother    Asthma Mother    CAD Father 30   Diabetes Father    Hypertension Father    Congestive Heart Failure Sister        died in her 81s   CAD Sister 18    Social History   Tobacco Use   Smoking status: Former    Packs/day: 1.00    Years: 30.00    Pack years: 30.00    Types: Cigarettes    Quit date: 01/09/2004    Years since quitting: 17.2   Smokeless tobacco: Never  Vaping Use   Vaping Use: Never used  Substance Use Topics   Alcohol use: No    Alcohol/week: 0.0 standard drinks   Drug use: No    Home Medications Prior to  Admission medications   Medication Sig Start Date End Date Taking? Authorizing Provider  albuterol (PROAIR HFA) 108 (90 Base) MCG/ACT inhaler Inhale 2 puffs into the lungs every 4 (four) hours as needed for wheezing or shortness of breath. 04/20/21   Parrett, Fonnie Mu, NP  ALPRAZolam (XANAX) 0.5 MG tablet Take 1 tablet (0.5 mg total) by mouth 2 (two) times daily as needed for anxiety. Patient taking differently: Take 0.5 mg by mouth at bedtime. 07/31/15   Theodis Blaze, MD  atorvastatin (LIPITOR) 40 MG tablet Take 40 mg by mouth at bedtime.    [provider]  Biotin 5000 MCG CAPS Take 5,000 mcg by mouth daily.    [provider]  budesonide (PULMICORT) 0.5 MG/2ML nebulizer solution USE 1 VIAL IN NEBULIZER IN THE MORNING AND AT BEDTIME Patient taking differently: Take 0.5 mg by nebulization 2 (two) times daily. 03/29/21   Tanda Rockers, MD  chlorpheniramine-HYDROcodone (TUSSIONEX) 10-8 MG/5ML SUER Take 5 mLs by mouth every 12 (twelve) hours as needed for cough. 04/10/21   Ghimire, Henreitta Leber, MD  diltiazem (CARDIZEM CD) 240 MG 24 hr capsule Take 1 capsule (240 mg total) by mouth daily. 03/28/21   Jerline Pain, MD  divalproex (DEPAKOTE ER) 500 MG 24 hr tablet Take 1 tablet (500 mg total) by mouth at bedtime. 03/15/21   Marcial Pacas, MD  esomeprazole (NEXIUM) 40 MG capsule Take 40 mg by mouth 2 (two) times daily before a meal.    [provider]  fluticasone (FLONASE) 50 MCG/ACT nasal spray Place 2 sprays into both nostrils daily as needed for allergies.    [provider]  formoterol (PERFOROMIST) 20 MCG/2ML nebulizer solution USE 1 VIAL  IN  NEBULIZER TWICE  DAILY - morning and evening Patient taking differently: 20 mcg 2 (two) times daily. 03/29/21   Tanda Rockers, MD  hydrALAZINE (APRESOLINE) 100 MG tablet Take 1 tablet (100 mg total) by mouth 3 (three) times daily. 04/10/21   Ghimire, Henreitta Leber, MD  ipratropium-albuterol (DUONEB) 0.5-2.5 (3) MG/3ML SOLN Take 3  mLs by nebulization every 8 (eight) hours as needed (SOB). 04/10/21   Ghimire, Henreitta Leber, MD  latanoprost (XALATAN) 0.005 % ophthalmic solution Place 1 drop into both eyes at bedtime. 02/05/20   [provider]  loratadine (CLARITIN) 10 MG tablet Take 1 tablet (10 mg total) by mouth daily. 04/11/21   Ghimire, Henreitta Leber, MD  memantine (NAMENDA) 10 MG tablet Take 1 tablet (10 mg total) by mouth 2 (two) times daily. 03/15/21   Marcial Pacas, MD  metFORMIN (GLUCOPHAGE) 500 MG tablet Take 500 mg by mouth 2 (two) times daily with a meal.    [provider]  montelukast (SINGULAIR) 10 MG tablet Take 1 tablet (10 mg total) by mouth at bedtime. 01/11/21   Tanda Rockers, MD  Multiple Vitamin (MULTIVITAMIN WITH MINERALS) TABS tablet Take 1 tablet by mouth every morning.    [provider]  nebivolol (BYSTOLIC) 10 MG tablet Take 1 tablet (10 mg total) by mouth daily. 04/10/19   Jerline Pain, MD  nitroGLYCERIN (NITROSTAT) 0.4 MG SL tablet DISSOLVE ONE TABLET UNDER THE TONGUE EVERY 5 MINUTES AS NEEDED FOR CHEST PAIN.  DO NOT EXCEED A TOTAL OF 3 DOSES IN 15 MINUTES Patient taking differently: 0.4 mg every 5 (five) minutes as needed for chest pain. 11/19/19   Burtis Junes, NP  potassium chloride SA (K-DUR,KLOR-CON) 20 MEQ tablet Take 20 mEq by mouth every morning.    [provider]  predniSONE (DELTASONE) 10 MG tablet Take 30 mg daily for 1 day, 20 mg daily for 1 days,10 mg daily for 1 day, then stop 04/10/21   Ghimire, Henreitta Leber, MD  predniSONE (DELTASONE) 10 MG tablet 4 tabs for 2 days, then 3 tabs for 2 days, 2 tabs for 2 days, then 1 tab for 2 days, then stop 04/22/21   Parrett, [name] S, NP  Probiotic Product (EQ PROBIOTIC) CAPS Take 1 capsule by mouth every morning.    [provider]  Psyllium (METAMUCIL PO) Take 1 capsule by mouth every morning.    [provider]    Allergies    Tramadol  Review of Systems   Review of Systems  Constitutional:   Positive for fatigue. Negative for chills and fever.  HENT:  Negative for congestion.   Respiratory:  Positive for cough, shortness of breath and wheezing. Negative for chest  tightness.   Cardiovascular:  Negative for chest pain, palpitations and leg swelling.  Gastrointestinal:  Negative for abdominal pain, diarrhea and vomiting.  Genitourinary:  Negative for dysuria.  Musculoskeletal:  Negative for back pain and neck pain.  Skin:  Negative for rash.  Neurological:  Negative for headaches.   Physical Exam Updated Vital Signs BP (!) 145/72    Pulse (!) 103    Temp 98.2 F (36.8 C)    Resp 12    Ht 5\' 2"  (1.575 m)    Wt 75.8 kg    LMP  (LMP Unknown)    SpO2 99%    BMI 30.54 kg/m   Physical Exam Vitals and nursing note reviewed.  Constitutional:      Appearance: Normal appearance. She is diaphoretic.  HENT:     Head: Normocephalic.     Mouth/Throat:     Mouth: Mucous membranes are moist.  Cardiovascular:     Rate and Rhythm: Normal rate.  Pulmonary:     Effort: Tachypnea and accessory muscle usage present. No respiratory distress.     Breath sounds: Examination of the right-lower field reveals decreased breath sounds. Examination of the left-lower field reveals decreased breath sounds. Decreased breath sounds and wheezing present. No rales.  Abdominal:     Palpations: Abdomen is soft.     Tenderness: There is no abdominal tenderness.  Musculoskeletal:     Right lower leg: No edema.     Left lower leg: No edema.  Skin:    General: Skin is warm.  Neurological:     Mental Status: She is alert and oriented to person, place, and time. Mental status is at baseline.  Psychiatric:        Mood and Affect: Mood normal.    ED Results / Procedures / Treatments   Labs (all labs ordered are listed, but only abnormal results are displayed) Labs Reviewed  CBC WITH DIFFERENTIAL/PLATELET - Abnormal; Notable for the following components:      Result Value   WBC 13.6 (*)    RBC 3.40 (*)     Hemoglobin 10.4 (*)    HCT 32.0 (*)    Neutro Abs 9.0 (*)    Monocytes Absolute 1.1 (*)    Abs Immature Granulocytes 0.11 (*)    All other components within normal limits  BASIC METABOLIC PANEL - Abnormal; Notable for the following components:   Glucose, Bld 126 (*)    BUN 31 (*)    Creatinine, Ser 1.44 (*)    Calcium 8.3 (*)    GFR, Estimated 39 (*)    All other components within normal limits  RESP PANEL BY RT-PCR (FLU A&B, COVID) [id number]    EKG EKG Interpretation  Date/Time:  Friday April 22 2021 10:37:35 EST Ventricular Rate:  87 PR Interval:  152 QRS Duration: 78 QT Interval:  350 QTC Calculation: 421 R Axis:   3 Text Interpretation: Normal sinus rhythm Minimal voltage criteria for LVH, may be normal variant ( R in aVL ) Borderline ECG similar to previous Confirmed by Lavenia Atlas [id number]) on 04/22/2021 2:50:14 PM  Radiology DG Chest Portable 1 View  Result Date: 04/22/2021 CLINICAL DATA:  Shortness of breath EXAM: PORTABLE CHEST 1 VIEW COMPARISON:  Chest radiograph dated April 08, 2021 FINDINGS: The heart is normal in size. Low lung volumes with bibasilar atelectasis. No focal consolidation or pleural effusion. The osseous structures are unremarkable. IMPRESSION: Low lung volumes without evidence of focal consolidation or pleural effusion. Bibasilar atelectasis. Electronically  Signed   By: Keane Police D.O.   On: 04/22/2021 11:35    Procedures Procedures   Medications Ordered in ED Medications  ipratropium-albuterol (DUONEB) 0.5-2.5 (3) MG/3ML nebulizer solution 3 mL (has no administration in time range)  ipratropium-albuterol (DUONEB) 0.5-2.5 (3) MG/3ML nebulizer solution (3 mLs  Given 04/22/21 1039)  methylPREDNISolone sodium succinate (SOLU-MEDROL) 125 mg/2 mL injection 125 mg (125 mg Intravenous Given 04/22/21 1208)  albuterol (PROVENTIL,VENTOLIN) solution continuous neb (10 mg/hr Nebulization Given 04/22/21 1201)  acetaminophen (TYLENOL) tablet 1,000 mg  (1,000 mg Oral Given 04/22/21 1300)    ED Course  I have reviewed the triage vital signs and the nursing notes.  Pertinent labs & imaging results that were available during my care of the patient were reviewed by me and considered in my medical decision making (see chart for details).    MDM Rules/Calculators/A&P                          70 year old female presents emergency department with shortness of breath, wheezing, difficulty breathing.  Vitals are normal on arrival.  She has audible wheezing even without stethoscope.  She is dyspneic, tachypneic but maintaining oxygen saturations.  She has diffuse wheezing on exam, decreased breath sounds, no rales.  No swelling in her lower extremities, no obvious findings of CHF.  Blood work shows baseline AKI, she is a mild leukocytosis may be secondary to chronic steroid use.  EKG is unchanged for her.  Chest x-ray shows no pneumonia.  Patient's been given steroids and multiple breathing treatments as well as an hour continuous.  Although she is improved she continues to have audible wheezing and dyspnea, will require admission, does not appear to need BiPAP at this time.  Plan for readmission to [facility]ist with recent discharge.  Patients evaluation and results requires admission for further treatment and care. Patient agrees with admission plan, offers no new complaints and is stable/unchanged at time of admit.     Final Clinical Impression(s) / ED Diagnoses Final diagnoses:  Exacerbation of asthma, unspecified asthma severity, unspecified whether persistent    Rx / DC Orders ED Discharge Orders     None        Lorelle Gibbs, DO 04/22/21 1503

## 2021-04-22 NOTE — Subjective & Objective (Signed)
CC: SOB, wheezing HPI: 70 year old female with a history of asthma severe persistent, hypertension, diabetes, chronic diastolic heart failure, coronary disease, reflux, CKD stage III presents to the ER today with worsening shortness of breath.  Patient had been admitted to the hospital for 9 days in the early part December for asthma exacerbation.  She was discharged home on 5 days of prednisone.  She states that she did complete her steroid course.  She went back to see her pulmonologist on 21 December.  She was started again on some prednisone due to worsening breathing.  She came back to the ER today due to failed outpatient therapy..  Lab evaluation showed a white count of 13.6, hemoglobin 10.4, platelets of 175.  Chemistry sodium 137, BUN 31, creatinine 1.44.  Patient has known CKD stage III.  Chest x-ray negative for pneumonia.  Patient transferred to [facility] for further care.

## 2021-04-22 NOTE — ED Notes (Signed)
Family updated as to patient's status.

## 2021-04-22 NOTE — Assessment & Plan Note (Addendum)
Add sliding scale insulin.  Uncontrolled with hyperglycemia.  Rapidly tapering steroids.  Monitor.

## 2021-04-22 NOTE — Assessment & Plan Note (Addendum)
Continues to have wheezing although appears to be mostly upper airway. Will likely benefit from outpatient ENT follow-up. Currently on prednisone.  Nebulizer therapy. Hypoxia improving.

## 2021-04-22 NOTE — ED Notes (Signed)
[facility] called for transport. 

## 2021-04-22 NOTE — Assessment & Plan Note (Signed)
Continue Lipitor and Bystolic.

## 2021-04-22 NOTE — Telephone Encounter (Signed)
Pt has an appt scheduled with TP 12/23 at 9am. All will be able to be taken care of during that appt. Nothing further needed.

## 2021-04-22 NOTE — Assessment & Plan Note (Signed)
Continue PPI ?

## 2021-04-22 NOTE — H&P (Signed)
History and Physical    [name] ZOX:[id number] DOB: [birth year] DOA: 04/22/2021  PCP: [name], MD   Patient coming from: Home  I have personally briefly reviewed patient's old medical records in [place]  CC: SOB, wheezing HPI: 70 year old female with a history of asthma severe persistent, hypertension, diabetes, chronic diastolic heart failure, coronary disease, reflux, CKD stage III presents to the ER today with worsening shortness of breath.  Patient had been admitted to the hospital for 9 days in the early part December for asthma exacerbation.  She was discharged home on 5 days of prednisone.  She states that she did complete her steroid course.  She went back to see her pulmonologist on 21 December.  She was started again on some prednisone due to worsening breathing.  She came back to the ER today due to failed outpatient therapy..  Lab evaluation showed a white count of 13.6, hemoglobin 10.4, platelets of 175.  Chemistry sodium 137, BUN 31, creatinine 1.44.  Patient has known CKD stage III.  Chest x-ray negative for pneumonia.  Patient transferred to [facility] for further care.   ED Course: labs unremarkable. CXR negative for pna. Started on steroids and nebs.  Review of Systems:  Review of Systems  Constitutional:  Positive for malaise/fatigue. Negative for chills, fever and weight loss.  HENT: Negative.    Eyes: Negative.   Respiratory:  Positive for cough, shortness of breath and wheezing.   Cardiovascular:  Positive for leg swelling. Negative for chest pain and palpitations.       She thinks LE edema due to 2.5 weeks of steroids she has been taking.  Gastrointestinal: Negative.   Genitourinary: Negative.   Musculoskeletal: Negative.   Skin: Negative.   Neurological: Negative.   Endo/Heme/Allergies: Negative.   Psychiatric/Behavioral: Negative.    All other systems reviewed and are negative.  Past Medical History:  Diagnosis Date    Anxiety    Arthritis    Asthma    Chronic diastolic CHF (congestive heart failure) ([place]) 07/17/2016   Coronary artery disease    a. NSTEMI 12/2015 - LHC 01/18/16: s/p overlapping DESx2 to RCA, 10% ost-prox Cx,10% mLAD.   Dementia ([place])    Depression    Diabetes mellitus    Dyslipidemia    GERD (gastroesophageal reflux disease)    History of seizure    Hypertension    Hypertensive heart disease    NSTEMI (non-ST elevated myocardial infarction) ([place]) 12/2015   Stage 3 chronic kidney disease (HCC)     Past Surgical History:  Procedure Laterality Date   ABDOMINAL HYSTERECTOMY     CARDIAC CATHETERIZATION  1994   minimal LAD dz, no other dz, EF normal   CARDIAC CATHETERIZATION N/A 01/18/2016   Procedure: Left Heart Cath and Coronary Angiography;  Surgeon: Burnell Blanks, MD;  Location: [facility];  Service: Cardiovascular;  Laterality: N/A;   CARDIAC CATHETERIZATION N/A 01/18/2016   Procedure: Coronary Stent Intervention;  Surgeon: Burnell Blanks, MD;  Location: [facility];  Service: Cardiovascular;  Laterality: N/A;   COLONOSCOPY WITH PROPOFOL N/A 04/09/2014   Procedure: COLONOSCOPY WITH PROPOFOL;  Surgeon: Cleotis Nipper, MD;  Location: [facility];  Service: Endoscopy;  Laterality: N/A;   CORONARY STENT PLACEMENT  01/19/2016   STENT SYNERGY DES 8.11B14 drug eluting stent was successfully placed, and overlaps the 2.5 x 38 mm Synergy stent placed distally.   ESOPHAGOGASTRODUODENOSCOPY (EGD) WITH PROPOFOL N/A 04/09/2014   Procedure: ESOPHAGOGASTRODUODENOSCOPY (  EGD) WITH PROPOFOL;  Surgeon: Cleotis Nipper, MD;  Location: [facility];  Service: Endoscopy;  Laterality: N/A;   FOOT SURGERY Bilateral    approx ~2000   HEMORRHOID SURGERY     HERNIA REPAIR     KNEE ARTHROSCOPY     RADIOACTIVE SEED GUIDED EXCISIONAL BREAST BIOPSY Left 01/20/2021   Procedure: RADIOACTIVE SEED GUIDED EXCISIONAL LEFT BREAST BIOPSY;  Surgeon: Stark Klein, MD;  Location: [facility];  Service: General;  Laterality: Left;     reports that she quit smoking about 17 years ago. Her smoking use included cigarettes. She has a 30.00 pack-year smoking history. She has never used smokeless tobacco. She reports that she does not drink alcohol and does not use drugs.  Allergies  Allergen Reactions   Chlorthalidone Other (See Comments)    Was stopped due to kidney function   Tramadol Other (See Comments)    Not to be taken because of existing seizure disorder    Family History  Problem Relation Age of Onset   Allergies Mother    Asthma Mother    CAD Father 50   Diabetes Father    Hypertension Father    Congestive Heart Failure Sister        died in her 28s   CAD Sister 47    Prior to Admission medications   Medication Sig Start Date End Date Taking? Authorizing Provider  albuterol (PROAIR HFA) 108 (90 Base) MCG/ACT inhaler Inhale 2 puffs into the lungs every 4 (four) hours as needed for wheezing or shortness of breath. 04/20/21  Yes Parrett, Fonnie Mu, NP  ALPRAZolam (XANAX) 0.5 MG tablet Take 1 tablet (0.5 mg total) by mouth 2 (two) times daily as needed for anxiety. Patient taking differently: Take 0.5 mg by mouth See admin instructions. Take 0.5 mg by mouth at bedtime and an additional 0.5 mg once a day as needed for anxiety 07/31/15  Yes Theodis Blaze, MD  atorvastatin (LIPITOR) 40 MG tablet Take 40 mg by mouth at bedtime.   Yes [provider]  Biotin 5000 MCG CAPS Take 5,000 mcg by mouth daily.   Yes [provider]  budesonide (PULMICORT) 0.5 MG/2ML nebulizer solution USE 1 VIAL IN NEBULIZER IN THE MORNING AND AT BEDTIME Patient taking differently: Take 0.5 mg by nebulization 2 (two) times daily. 03/29/21  Yes Tanda Rockers, MD  chlorpheniramine-HYDROcodone (TUSSIONEX) 10-8 MG/5ML SUER Take 5 mLs by mouth every 12 (twelve) hours as needed for cough. 04/10/21  Yes Ghimire, Henreitta Leber, MD  diltiazem (CARDIZEM CD) 240 MG 24 hr capsule Take 1 capsule  (240 mg total) by mouth daily. 03/28/21  Yes Jerline Pain, MD  divalproex (DEPAKOTE ER) 500 MG 24 hr tablet Take 1 tablet (500 mg total) by mouth at bedtime. 03/15/21  Yes Marcial Pacas, MD  esomeprazole (NEXIUM) 40 MG capsule Take 40 mg by mouth 2 (two) times daily before a meal.   Yes [provider]  fluticasone (FLONASE) 50 MCG/ACT nasal spray Place 2 sprays into both nostrils daily as needed for allergies.   Yes [provider]  formoterol (PERFOROMIST) 20 MCG/2ML nebulizer solution USE 1 VIAL  IN  NEBULIZER TWICE  DAILY - morning and evening Patient taking differently: 20 mcg 2 (two) times daily. 03/29/21  Yes Tanda Rockers, MD  hydrALAZINE (APRESOLINE) 50 MG tablet Take 50 mg by mouth 3 (three) times daily.   Yes [provider]  ipratropium-albuterol (DUONEB) 0.5-2.5 (3) MG/3ML SOLN Take 3 mLs  by nebulization every 8 (eight) hours as needed (SOB). 04/10/21  Yes Ghimire, Henreitta Leber, MD  latanoprost (XALATAN) 0.005 % ophthalmic solution Place 1 drop into both eyes at bedtime. 02/05/20  Yes [provider]  loratadine (CLARITIN) 10 MG tablet Take 1 tablet (10 mg total) by mouth daily. Patient taking differently: Take 10 mg by mouth daily as needed for allergies or rhinitis. 04/11/21  Yes Ghimire, Henreitta Leber, MD  memantine (NAMENDA) 10 MG tablet Take 1 tablet (10 mg total) by mouth 2 (two) times daily. Patient taking differently: Take 10 mg by mouth in the morning and at bedtime. 03/15/21  Yes Marcial Pacas, MD  metFORMIN (GLUCOPHAGE) 500 MG tablet Take 500 mg by mouth 2 (two) times daily with a meal.   Yes [provider]  montelukast (SINGULAIR) 10 MG tablet Take 1 tablet (10 mg total) by mouth at bedtime. 01/11/21  Yes Tanda Rockers, MD  Multiple Vitamin (MULTIVITAMIN WITH MINERALS) TABS tablet Take 1 tablet by mouth every morning.   Yes [provider]  nebivolol (BYSTOLIC) 10 MG tablet Take 1 tablet (10 mg total) by mouth daily. 04/10/19  Yes  Jerline Pain, MD  nitroGLYCERIN (NITROSTAT) 0.4 MG SL tablet DISSOLVE ONE TABLET UNDER THE TONGUE EVERY 5 MINUTES AS NEEDED FOR CHEST PAIN.  DO NOT EXCEED A TOTAL OF 3 DOSES IN 15 MINUTES Patient taking differently: 0.4 mg every 5 (five) minutes as needed for chest pain. 11/19/19  Yes Burtis Junes, NP  potassium chloride SA (K-DUR,KLOR-CON) 20 MEQ tablet Take 20 mEq by mouth every morning.   Yes [provider]  predniSONE (DELTASONE) 20 MG tablet Take 20 mg by mouth daily with breakfast. 04/20/21 04/30/21 Yes [provider]  Probiotic Product (EQ PROBIOTIC) CAPS Take 1 capsule by mouth every morning.   Yes [provider]  Psyllium (METAMUCIL PO) Take by mouth See admin instructions. Mix powder as directed and drink once a day as directed for constipation   Yes [provider]  hydrALAZINE (APRESOLINE) 100 MG tablet Take 1 tablet (100 mg total) by mouth 3 (three) times daily. Patient not taking: Reported on 04/22/2021 04/22/21   Jerline Pain, MD  predniSONE (DELTASONE) 10 MG tablet Take 30 mg daily for 1 day, 20 mg daily for 1 days,10 mg daily for 1 day, then stop Patient not taking: Reported on 04/22/2021 04/10/21   Jonetta Osgood, MD  predniSONE (DELTASONE) 10 MG tablet 4 tabs for 2 days, then 3 tabs for 2 days, 2 tabs for 2 days, then 1 tab for 2 days, then stop Patient not taking: Reported on 04/22/2021 04/22/21   Melvenia Needles, NP    Physical Exam: Vitals:   04/22/21 1700 04/22/21 1730 04/22/21 1735 04/22/21 1823  BP: 128/76 (!) 143/91  (!) 144/76  Pulse: (!) 107 (!) 110  (!) 108  Resp: 17 18  20   Temp:   98.1 F (36.7 C) 98.2 F (36.8 C)  TempSrc:   Oral Oral  SpO2: 96% 96%    Weight:      Height:        Physical Exam   Labs on Admission: I have personally reviewed following labs and imaging studies  CBC: Recent Labs  Lab 04/22/21 1208  WBC 13.6*  NEUTROABS 9.0*  HGB 10.4*  HCT 32.0*  MCV 94.1  PLT 161   Basic  Metabolic Panel: Recent Labs  Lab 04/22/21 1208  NA 137  K 4.0  CL 98  CO2 28  GLUCOSE 126*  BUN 31*  CREATININE 1.44*  CALCIUM 8.3*   GFR: Estimated Creatinine Clearance: 33.7 mL/min (A) (by C-G formula based on SCr of 1.44 mg/dL (H)). Liver Function Tests: No results for input(s): AST, ALT, ALKPHOS, BILITOT, PROT, ALBUMIN in the last 168 hours. No results for input(s): LIPASE, AMYLASE in the last 168 hours. No results for input(s): AMMONIA in the last 168 hours. Coagulation Profile: No results for input(s): INR, PROTIME in the last 168 hours. Cardiac Enzymes: No results for input(s): CKTOTAL, CKMB, CKMBINDEX, TROPONINI in the last 168 hours. BNP (last 3 results) No results for input(s): PROBNP in the last 8760 hours. HbA1C: No results for input(s): HGBA1C in the last 72 hours. CBG: No results for input(s): GLUCAP in the last 168 hours. Lipid Profile: No results for input(s): CHOL, HDL, LDLCALC, TRIG, CHOLHDL, LDLDIRECT in the last 72 hours. Thyroid Function Tests: No results for input(s): TSH, T4TOTAL, FREET4, T3FREE, THYROIDAB in the last 72 hours. Anemia Panel: No results for input(s): VITAMINB12, FOLATE, FERRITIN, TIBC, IRON, RETICCTPCT in the last 72 hours. Urine analysis:    Component Value Date/Time   COLORURINE YELLOW 12/20/2017 [place] 12/20/2017 0238   LABSPEC 1.011 12/20/2017 0238   PHURINE 5.0 12/20/2017 0238   GLUCOSEU NEGATIVE 12/20/2017 0238   HGBUR NEGATIVE 12/20/2017 0238   BILIRUBINUR NEGATIVE 12/20/2017 0238   KETONESUR NEGATIVE 12/20/2017 0238   PROTEINUR NEGATIVE 12/20/2017 0238   UROBILINOGEN 0.2 08/07/2014 1304   NITRITE NEGATIVE 12/20/2017 0238   LEUKOCYTESUR NEGATIVE 12/20/2017 0238    Radiological Exams on Admission: I have personally reviewed images DG Chest Portable 1 View  Result Date: 04/22/2021 CLINICAL DATA:  Shortness of breath EXAM: PORTABLE CHEST 1 VIEW COMPARISON:  Chest radiograph dated April 08, 2021  FINDINGS: The heart is normal in size. Low lung volumes with bibasilar atelectasis. No focal consolidation or pleural effusion. The osseous structures are unremarkable. IMPRESSION: Low lung volumes without evidence of focal consolidation or pleural effusion. Bibasilar atelectasis. Electronically Signed   By: Keane Police D.O.   On: 04/22/2021 11:35    EKG: I have personally reviewed EKG: NSR    Assessment/Plan Principal Problem:   Asthma exacerbation Active Problems:   Essential hypertension   G E R D   Diabetes mellitus type 2, controlled (HCC)   CAD in native artery   Chronic diastolic CHF (congestive heart failure) (HCC)   CKD (chronic kidney disease), stage III (HCC)    Asthma exacerbation Admit to inpatient bed.  Continue Solu-Medrol every 12 hours.  Continue duo nebs every 6 hours.  Patient had human metapneumovirus during her last hospitalization.  She is tested negative for flu and COVID.  Essential hypertension Chronic.  Continue Cardizem, hydralazine, Bystolic.  Diabetes mellitus type 2, controlled ([place]) Add sliding scale insulin.  May need long-acting Lantus since she is going to be on corticosteroids.  Chronic diastolic CHF (congestive heart failure) (HCC) Stable.  Patient euvolemic.  G E R D Continue PPI.  CAD in native artery Continue Lipitor and Bystolic.  CKD (chronic kidney disease), stage III (HCC) Stable.  DVT prophylaxis: Lovenox Code Status: Full Code Family Communication: no family at bedside  Disposition Plan: return home  Consults called: none  Admission status: Inpatient, Med-Surg   Kristopher Oppenheim, DO [facility] 04/22/2021, 8:23 PM

## 2021-04-22 NOTE — Telephone Encounter (Signed)
I left a brief message for the patient to call back and make an OV or a MyChart visit with the provider. Waiting on a call back.

## 2021-04-22 NOTE — Progress Notes (Signed)
Virtual Visit via Video Note  I connected with [name] on 04/22/21 at  9:00 AM EST by a video enabled telemedicine application and verified that I am speaking with the correct person using two identifiers.  Location: Patient: Home  Provider: Office    I discussed the limitations of evaluation and management by telemedicine and the availability of in person appointments. The patient expressed understanding and agreed to proceed.  History of Present Illness: 70 year old female former smoker followed for asthma and chronic cough Medical history significant for coronary artery disease, diabetes, diastolic heart failure and seizure disorder  Today's video visit is for an acute office visit.  Patient complains of ongoing cough.  She was recently hospitalized last week for an asthma exacerbation complicated by metapneumovirus.  She was treated with IV steroids and nebulized bronchodilators.  Patient remains on Pulmicort and Perforomist nebulizer twice daily.  Patient was seen in the office earlier this week.  She was given additional course of steroids over the next 10 days.  Recommend use Delsym for cough control.  And recommended for dental follow-up.  Patient says that she is not any better she continues to have ongoing cough and significant wheezing.  Has not been able to sleep for the last day or 2.  She has no discolored mucus or fever.  But just cannot stop wheezing and coughing.  She has been using her DuoNeb 4 times a day in addition to her maintenance regimen with Pulmicort and Perforomist nebulizer. Patient says that she is has chest tightness and bilateral rib pain.  She denies any hemoptysis, orthopnea, edema.  Appetite is okay with no nausea vomiting or diarrhea  Past Medical History:  Diagnosis Date   Anxiety    Arthritis    Asthma    Chronic diastolic CHF (congestive heart failure) ([place]) 07/17/2016   Coronary artery disease    a. NSTEMI 12/2015 - LHC 01/18/16: s/p overlapping  DESx2 to RCA, 10% ost-prox Cx,10% mLAD.   Dementia ([place])    Depression    Diabetes mellitus    Dyslipidemia    GERD (gastroesophageal reflux disease)    History of seizure    Hypertension    Hypertensive heart disease    NSTEMI (non-ST elevated myocardial infarction) ([place]) 12/2015   Stage 3 chronic kidney disease ([place])     Current Outpatient Medications on File Prior to Visit  Medication Sig Dispense Refill   albuterol (PROAIR HFA) 108 (90 Base) MCG/ACT inhaler Inhale 2 puffs into the lungs every 4 (four) hours as needed for wheezing or shortness of breath. 18 g 2   ALPRAZolam (XANAX) 0.5 MG tablet Take 1 tablet (0.5 mg total) by mouth 2 (two) times daily as needed for anxiety. (Patient taking differently: Take 0.5 mg by mouth at bedtime.) 30 tablet 0   atorvastatin (LIPITOR) 40 MG tablet Take 40 mg by mouth at bedtime.     Biotin 5000 MCG CAPS Take 5,000 mcg by mouth daily.     budesonide (PULMICORT) 0.5 MG/2ML nebulizer solution USE 1 VIAL IN NEBULIZER IN THE MORNING AND AT BEDTIME (Patient taking differently: Take 0.5 mg by nebulization 2 (two) times daily.) 120 mL 11   chlorpheniramine-HYDROcodone (TUSSIONEX) 10-8 MG/5ML SUER Take 5 mLs by mouth every 12 (twelve) hours as needed for cough. 140 mL 0   diltiazem (CARDIZEM CD) 240 MG 24 hr capsule Take 1 capsule (240 mg total) by mouth daily. 90 capsule 0   divalproex (DEPAKOTE ER) 500 MG 24 hr tablet  Take 1 tablet (500 mg total) by mouth at bedtime. 90 tablet 1   esomeprazole (NEXIUM) 40 MG capsule Take 40 mg by mouth 2 (two) times daily before a meal.     fluticasone (FLONASE) 50 MCG/ACT nasal spray Place 2 sprays into both nostrils daily as needed for allergies.     formoterol (PERFOROMIST) 20 MCG/2ML nebulizer solution USE 1 VIAL  IN  NEBULIZER TWICE  DAILY - morning and evening (Patient taking differently: 20 mcg 2 (two) times daily.) 120 mL 11   hydrALAZINE (APRESOLINE) 100 MG tablet Take 1 tablet (100 mg total) by mouth 3 (three)  times daily. 90 tablet 3   ipratropium-albuterol (DUONEB) 0.5-2.5 (3) MG/3ML SOLN Take 3 mLs by nebulization every 8 (eight) hours as needed (SOB). 360 mL 1   memantine (NAMENDA) 10 MG tablet Take 1 tablet (10 mg total) by mouth 2 (two) times daily. 180 tablet 1   metFORMIN (GLUCOPHAGE) 500 MG tablet Take 500 mg by mouth 2 (two) times daily with a meal.     montelukast (SINGULAIR) 10 MG tablet Take 1 tablet (10 mg total) by mouth at bedtime. 30 tablet 5   Multiple Vitamin (MULTIVITAMIN WITH MINERALS) TABS tablet Take 1 tablet by mouth every morning.     nebivolol (BYSTOLIC) 10 MG tablet Take 1 tablet (10 mg total) by mouth daily. 34 tablet 11   nitroGLYCERIN (NITROSTAT) 0.4 MG SL tablet DISSOLVE ONE TABLET UNDER THE TONGUE EVERY 5 MINUTES AS NEEDED FOR CHEST PAIN.  DO NOT EXCEED A TOTAL OF 3 DOSES IN 15 MINUTES (Patient taking differently: 0.4 mg every 5 (five) minutes as needed for chest pain.) 25 tablet 0   potassium chloride SA (K-DUR,KLOR-CON) 20 MEQ tablet Take 20 mEq by mouth every morning.     Probiotic Product (EQ PROBIOTIC) CAPS Take 1 capsule by mouth every morning.     Psyllium (METAMUCIL PO) Take 1 capsule by mouth every morning.     latanoprost (XALATAN) 0.005 % ophthalmic solution Place 1 drop into both eyes at bedtime.     loratadine (CLARITIN) 10 MG tablet Take 1 tablet (10 mg total) by mouth daily. 15 tablet 0   predniSONE (DELTASONE) 10 MG tablet Take 30 mg daily for 1 day, 20 mg daily for 1 days,10 mg daily for 1 day, then stop 6 tablet 0   No current facility-administered medications on file prior to visit.       Observations/Objective: 04/22/2021 > patient with audible wheezing on video, increased work of breathing noted.  Positive cough Home O2 sat check 95% on room air    FEN0 01/24/2016 = 13 with active symptoms on symb 160 2bid > changed to dulera 100  - Spirometry 02/23/2016  NO signifiant obstruction with active symptoms  - FENO 02/23/2016  =  12   On dulera 100  2bid (unable to confirm adeherence as did not bring it - Singulair 10 mg daily added 02/23/2016 to dulera 100 2bid with 2 week sample only  (unable to verify she used it) - FENO 04/07/2016  =   43 -CTa CHest 2017  >neg PE . 4 mm RUL nodule  CT sinus 2016 neg GEN IgE 87 2017, positive [place] grass, dust, tree High-resolution CT chest April 2018--NEG  for ILD, stable 4 mm right upper lobe nodule. Echo 07/2017 EF 65%, Gr 2 DD , PAP 74mmHg  Spirometry 08/2017 Moderate restriction , no obstruction practice ~  Assessment and Plan: Severe asthmatic bronchitic exacerbation with recent metapneumovirus.  Patient continues to have ongoing high symptom burden despite aggressive maintenance regimen and steroids. Today she appears to be in some mild distress with audible wheezing and increased work of breathing. Feel that she may need hospitalization.  Of advised her to go to the emergency room for further evaluation treatment option and consideration of hospital admission  Plan  Patient Instructions  Recommended to go to the emergency room as we discussed for further evaluation and treatment as your asthma flare seems to be getting worse..   New prednisone taper sent to your pharmacy Begin Robitussin DM 2 teaspoons every 4 hours as needed for cough Change Claritin to Zyrtec 10 mg at bedtime Continue on Budesonide and Perforomist neb Twice daily   Begin Albuterol /Ipratropium Neb Twice daily  for 1 week then As needed   Activity as tolerated .  Dental follow up as discussed.  Follow up with Dr. Melvyn Novas  in 1 week and As needed   Please contact office for sooner follow up if symptoms do not improve or worsen or seek emergency care      Follow Up Instructions: Follow-up in 1 week and as needed  Please contact office for sooner follow up if symptoms do not improve or worsen or seek emergency care     I discussed the assessment and treatment plan with the patient. The patient was provided an opportunity  to ask questions and all were answered. The patient agreed with the plan and demonstrated an understanding of the instructions.   The patient was advised to call back or seek an in-person evaluation if the symptoms worsen or if the condition fails to improve as anticipated.  I provided 30  minutes of non-face-to-face time during this encounter.   Rexene Edison, NP

## 2021-04-22 NOTE — ED Triage Notes (Signed)
Pt present with shob, wheezing and cough, hospitalized 9 days, home for a week.  Uses home neb as directed without relief. Can not speak in full sentences.

## 2021-04-22 NOTE — Assessment & Plan Note (Signed)
Chronic.  Continue Cardizem, hydralazine, Bystolic.

## 2021-04-23 DIAGNOSIS — I2510 Atherosclerotic heart disease of native coronary artery without angina pectoris: Secondary | ICD-10-CM | POA: Diagnosis not present

## 2021-04-23 DIAGNOSIS — K219 Gastro-esophageal reflux disease without esophagitis: Secondary | ICD-10-CM

## 2021-04-23 DIAGNOSIS — N183 Chronic kidney disease, stage 3 unspecified: Secondary | ICD-10-CM | POA: Diagnosis not present

## 2021-04-23 DIAGNOSIS — I5032 Chronic diastolic (congestive) heart failure: Secondary | ICD-10-CM | POA: Diagnosis not present

## 2021-04-23 DIAGNOSIS — J4551 Severe persistent asthma with (acute) exacerbation: Secondary | ICD-10-CM | POA: Diagnosis not present

## 2021-04-23 LAB — COMPREHENSIVE METABOLIC PANEL
ALT: 22 U/L (ref 0–44)
AST: 20 U/L (ref 15–41)
Albumin: 3.7 g/dL (ref 3.5–5.0)
Alkaline Phosphatase: 48 U/L (ref 38–126)
Anion gap: 13 (ref 5–15)
BUN: 36 mg/dL — ABNORMAL HIGH (ref 8–23)
CO2: 25 mmol/L (ref 22–32)
Calcium: 8.1 mg/dL — ABNORMAL LOW (ref 8.9–10.3)
Chloride: 99 mmol/L (ref 98–111)
Creatinine, Ser: 1.51 mg/dL — ABNORMAL HIGH (ref 0.44–1.00)
GFR, Estimated: 37 mL/min — ABNORMAL LOW (ref >60)
Glucose, Bld: 340 mg/dL — ABNORMAL HIGH (ref 70–99)
Potassium: 4.7 mmol/L (ref 3.5–5.1)
Sodium: 137 mmol/L (ref 135–145)
Total Bilirubin: 0.5 mg/dL (ref 0.3–1.2)
Total Protein: 6.9 g/dL (ref 6.5–8.1)

## 2021-04-23 LAB — CBC WITH DIFFERENTIAL/PLATELET
Abs Immature Granulocytes: 0.08 10*3/uL — ABNORMAL HIGH (ref 0.00–0.07)
Basophils Absolute: 0 10*3/uL (ref 0.0–0.1)
Basophils Relative: 0 %
Eosinophils Absolute: 0 10*3/uL (ref 0.0–0.5)
Eosinophils Relative: 0 %
HCT: 28.9 % — ABNORMAL LOW (ref 36.0–46.0)
Hemoglobin: 9.5 g/dL — ABNORMAL LOW (ref 12.0–15.0)
Immature Granulocytes: 1 %
Lymphocytes Relative: 8 %
Lymphs Abs: 0.8 10*3/uL (ref 0.7–4.0)
MCH: 31.3 pg (ref 26.0–34.0)
MCHC: 32.9 g/dL (ref 30.0–36.0)
MCV: 95.1 fL (ref 80.0–100.0)
Monocytes Absolute: 0.2 10*3/uL (ref 0.1–1.0)
Monocytes Relative: 2 %
Neutro Abs: 8.8 10*3/uL — ABNORMAL HIGH (ref 1.7–7.7)
Neutrophils Relative %: 89 %
Platelets: 158 10*3/uL (ref 150–400)
RBC: 3.04 MIL/uL — ABNORMAL LOW (ref 3.87–5.11)
RDW: 14.1 % (ref 11.5–15.5)
WBC: 9.9 10*3/uL (ref 4.0–10.5)
nRBC: 0 % (ref 0.0–0.2)

## 2021-04-23 LAB — MAGNESIUM: Magnesium: 1.2 mg/dL — ABNORMAL LOW (ref 1.7–2.4)

## 2021-04-23 LAB — GLUCOSE, CAPILLARY
Glucose-Capillary: 383 mg/dL — ABNORMAL HIGH (ref 70–99)
Glucose-Capillary: 420 mg/dL — ABNORMAL HIGH (ref 70–99)

## 2021-04-23 LAB — BRAIN NATRIURETIC PEPTIDE: B Natriuretic Peptide: 54 pg/mL (ref 0.0–100.0)

## 2021-04-23 MED ORDER — INSULIN ASPART 100 UNIT/ML IJ SOLN
7.0000 [IU] | Freq: Once | INTRAMUSCULAR | Status: AC
  Administered 2021-04-23: 22:00:00 7 [IU] via SUBCUTANEOUS

## 2021-04-23 MED ORDER — IPRATROPIUM-ALBUTEROL 0.5-2.5 (3) MG/3ML IN SOLN
3.0000 mL | RESPIRATORY_TRACT | Status: DC | PRN
  Administered 2021-04-24: 05:00:00 3 mL via RESPIRATORY_TRACT
  Filled 2021-04-23 (×2): qty 3

## 2021-04-23 MED ORDER — HYDROCODONE BIT-HOMATROP MBR 5-1.5 MG/5ML PO SOLN
5.0000 mL | ORAL | Status: DC | PRN
  Administered 2021-04-23 – 2021-04-24 (×3): 5 mL via ORAL
  Filled 2021-04-23 (×3): qty 5

## 2021-04-23 MED ORDER — METHYLPREDNISOLONE SODIUM SUCC 40 MG IJ SOLR
40.0000 mg | Freq: Once | INTRAMUSCULAR | Status: AC
  Administered 2021-04-23: 18:00:00 40 mg via INTRAVENOUS
  Filled 2021-04-23: qty 1

## 2021-04-23 MED ORDER — MAGNESIUM SULFATE 2 GM/50ML IV SOLN
2.0000 g | Freq: Once | INTRAVENOUS | Status: AC
Start: 2021-04-23 — End: 2021-04-23
  Administered 2021-04-23: 17:00:00 2 g via INTRAVENOUS
  Filled 2021-04-23: qty 50

## 2021-04-23 MED ORDER — MOMETASONE FURO-FORMOTEROL FUM 200-5 MCG/ACT IN AERO
2.0000 | INHALATION_SPRAY | Freq: Two times a day (BID) | RESPIRATORY_TRACT | Status: DC
  Administered 2021-04-24 – 2021-04-25 (×3): 2 via RESPIRATORY_TRACT
  Filled 2021-04-23: qty 8.8

## 2021-04-23 MED ORDER — INSULIN ASPART 100 UNIT/ML IJ SOLN
0.0000 [IU] | Freq: Three times a day (TID) | INTRAMUSCULAR | Status: DC
  Administered 2021-04-23: 18:00:00 15 [IU] via SUBCUTANEOUS
  Administered 2021-04-24: 09:00:00 8 [IU] via SUBCUTANEOUS
  Administered 2021-04-24: 12:00:00 11 [IU] via SUBCUTANEOUS
  Administered 2021-04-24 – 2021-04-25 (×2): 8 [IU] via SUBCUTANEOUS
  Administered 2021-04-25: 13:00:00 11 [IU] via SUBCUTANEOUS
  Administered 2021-04-26: 08:00:00 8 [IU] via SUBCUTANEOUS
  Administered 2021-04-26: 12:00:00 11 [IU] via SUBCUTANEOUS
  Administered 2021-04-26: 18:00:00 8 [IU] via SUBCUTANEOUS
  Administered 2021-04-27: 09:00:00 3 [IU] via SUBCUTANEOUS
  Administered 2021-04-27 – 2021-04-28 (×3): 5 [IU] via SUBCUTANEOUS

## 2021-04-23 MED ORDER — GUAIFENESIN-DM 100-10 MG/5ML PO SYRP
5.0000 mL | ORAL_SOLUTION | ORAL | Status: DC | PRN
  Administered 2021-04-23 – 2021-04-25 (×4): 5 mL via ORAL
  Filled 2021-04-23 (×4): qty 10

## 2021-04-23 MED ORDER — METHYLPREDNISOLONE SODIUM SUCC 125 MG IJ SOLR
81.2500 mg | Freq: Every day | INTRAMUSCULAR | Status: DC
  Administered 2021-04-24: 10:00:00 81.25 mg via INTRAVENOUS
  Filled 2021-04-23: qty 2

## 2021-04-23 NOTE — Progress Notes (Signed)
Transition of Care [facility]) Screening Note  Patient Details  Name: [name] Date of Birth: 1950-10-02  Transition of Care [facility]) CM/SW Contact:    Sherie Don, LCSW Phone Number: 04/23/2021, 9:15 AM  Transition of Care Department [facility]) has reviewed patient and no TOC needs have been identified at this time. We will continue to monitor patient advancement through interdisciplinary progression rounds. If new patient transition needs arise, please place a TOC consult.

## 2021-04-23 NOTE — Plan of Care (Signed)

## 2021-04-23 NOTE — Progress Notes (Signed)
PROGRESS NOTE    [name]  NFA:[id number] DOB: 1951-02-09 DOA: 04/22/2021 PCP: [name], MD    Brief Narrative:  [name] was admitted to the hospital with the working diagnosis of acute asthma exacerbation.   70 yo female with the past medical history of severe and persistent asthma, HTN, I6NG, chronic diastolic heart failure, CKD stage 3 and CAD who presented with dyspnea and wheezing. Recent hospitalization for asthma exacerbation due to metapneumo virus 12/2-12/03/2021. At home continue to have symptoms and her pulmonologist prescribed oral prednisone on follow up visit on 12/21. On her initial physical examination her blood pressure was 128/76, HR 107, RR 17 and oxygen saturation 96%.  Her lungs had decreased air movement and expiratory wheezing, heart with S1 and S2 present and tachycardic, abdomen soft and no lower extremity edema.   Na 137, K 4,7, Cl 98, bicarbonate 28, glucose 1126, BUN 31 and cr at 1,44.  Wbc 13, hgb 10,4 and hct 32,0, Plt 175.  SARS COVID-19 negative.  Chest radiograph with hyperinflation but no infiltrates.   EKG 87 bpm, normal axis, normal intervals, sinus rhythm, no significant ST segment or T wave changes.   Assessment & Plan:   Principal Problem:   Asthma exacerbation Active Problems:   Essential hypertension   G E R D   Diabetes mellitus type 2, controlled (HCC)   CAD in native artery   Chronic diastolic CHF (congestive heart failure) (HCC)   CKD (chronic kidney disease), stage III ([place])   Acute asthma exacerbation  Patient continue to have significant signs of air trapping, her oxymetry is 98% on 1 L/min per [place].   Plan to continue with systemic corticosteroids 40 mg IV q12 hrs Continue bronchodilator therapy and inhaled steroids Airway clearing techniques with flutter valve and incentive spirometer. As needed antitussive agents.   2. Chronic diastolic heart failure, HTN. No clinical signs of heart failure  exacerbation. Continue blood pressure monitoring. Continue with diltiazem, hydralazine and nebivolol.   3. T2DM with steroid induced hyperglycemia. dyslipidemia Continue glucose cover and monitoring with insulin sliding scale.  Will calculate requirements before starting basal insulin therapy.  Continue with statin therapy with atorvastatin.   4. CKD stage 3 a/ Mg 1.2. Baseline cr at 1,5, (old records personally reviewed). Today renal function with serum cr at 1,51 with K at 4,7 and serum bicarbonate at 25.  Add Mag sulfate and check electrolytes in am. Patient is tolerating po well.   5. Anemia of chronic disease. Hgb is 9,5 and hct at 28,9,. Continue close follow up of hgb and hct, currently no indication for transfusion.   6. Anxiety and depression, continue with divalproex and alprazolam.    Patient continue to be at high risk for worsening asthma exacerbation   Status is: Inpatient  Remains inpatient appropriate because: IV steroids and oxymetry monitoring    DVT prophylaxis: Enoxaparin   Code Status:    full  Family Communication:   No family at the bedside       Subjective: Patient with improved dyspnea but not back to baseline, continue to have symptoms with minimal efforts, no nausea or vomiting, no chest pain   Objective: Vitals:   04/23/21 0536 04/23/21 0718 04/23/21 0939 04/23/21 1023  BP: (!) 165/94  (!) 156/86   Pulse: (!) 109  (!) 105   Resp: 18  18   Temp: 98.4 F (36.9 C)  97.8 F (36.6 C)   TempSrc: Oral  Oral   SpO2: 100%  98%  97%  Weight:      Height:        Intake/Output Summary (Last 24 hours) at 04/23/2021 1111 Last data filed at 04/23/2021 1000 Gross per 24 hour  Intake 960 ml  Output 1001 ml  Net -41 ml   Filed Weights   04/22/21 1028  Weight: 75.8 kg    Examination:   General: positive dyspnea at rest, not in pain  Neurology: Awake and alert, non focal  E ENT: mild pallor, no icterus, oral mucosa moist Cardiovascular: No JVD.  S1-S2 present, rhythmic, no gallops, rubs, or murmurs. Trace bilateral lower extremity edema. Pulmonary: positive breath sounds bilaterally, decreased air movement with prolonged expiratory phase, mild expiratory wheezing, but no rhonchi or rales. Gastrointestinal. Abdomen soft and non tender Skin. No rashes Musculoskeletal: no joint deformities     Data Reviewed: I have personally reviewed following labs and imaging studies  CBC: Recent Labs  Lab 04/22/21 1208 04/23/21 0440  WBC 13.6* 9.9  NEUTROABS 9.0* 8.8*  HGB 10.4* 9.5*  HCT 32.0* 28.9*  MCV 94.1 95.1  PLT 175 938   Basic Metabolic Panel: Recent Labs  Lab 04/22/21 1208 04/23/21 0440  NA 137 137  K 4.0 4.7  CL 98 99  CO2 28 25  GLUCOSE 126* 340*  BUN 31* 36*  CREATININE 1.44* 1.51*  CALCIUM 8.3* 8.1*  MG  --  1.2*   GFR: Estimated Creatinine Clearance: 32.1 mL/min (A) (by C-G formula based on SCr of 1.51 mg/dL (H)). Liver Function Tests: Recent Labs  Lab 04/23/21 0440  AST 20  ALT 22  ALKPHOS 48  BILITOT 0.5  PROT 6.9  ALBUMIN 3.7   No results for input(s): LIPASE, AMYLASE in the last 168 hours. No results for input(s): AMMONIA in the last 168 hours. Coagulation Profile: No results for input(s): INR, PROTIME in the last 168 hours. Cardiac Enzymes: No results for input(s): CKTOTAL, CKMB, CKMBINDEX, TROPONINI in the last 168 hours. BNP (last 3 results) No results for input(s): PROBNP in the last 8760 hours. HbA1C: No results for input(s): HGBA1C in the last 72 hours. CBG: No results for input(s): GLUCAP in the last 168 hours. Lipid Profile: No results for input(s): CHOL, HDL, LDLCALC, TRIG, CHOLHDL, LDLDIRECT in the last 72 hours. Thyroid Function Tests: No results for input(s): TSH, T4TOTAL, FREET4, T3FREE, THYROIDAB in the last 72 hours. Anemia Panel: No results for input(s): VITAMINB12, FOLATE, FERRITIN, TIBC, IRON, RETICCTPCT in the last 72 hours.    Radiology Studies: I have reviewed  all of the imaging during this hospital visit personally     Scheduled Meds:  atorvastatin  40 mg Oral QHS   diltiazem  240 mg Oral Daily   divalproex  500 mg Oral QHS   enoxaparin (LOVENOX) injection  40 mg Subcutaneous Q24H   hydrALAZINE  50 mg Oral TID   ipratropium-albuterol  3 mL Nebulization Q6H   memantine  10 mg Oral BID   methylPREDNISolone (SOLU-MEDROL) injection  60 mg Intravenous Q12H   montelukast  10 mg Oral QHS   nebivolol  10 mg Oral Daily   pantoprazole  40 mg Oral Daily   Continuous Infusions:   LOS: 1 day        Jeyla Bulger Gerome Apley, MD

## 2021-04-24 DIAGNOSIS — N183 Chronic kidney disease, stage 3 unspecified: Secondary | ICD-10-CM | POA: Diagnosis not present

## 2021-04-24 DIAGNOSIS — I2510 Atherosclerotic heart disease of native coronary artery without angina pectoris: Secondary | ICD-10-CM | POA: Diagnosis not present

## 2021-04-24 DIAGNOSIS — I5032 Chronic diastolic (congestive) heart failure: Secondary | ICD-10-CM | POA: Diagnosis not present

## 2021-04-24 DIAGNOSIS — J4551 Severe persistent asthma with (acute) exacerbation: Secondary | ICD-10-CM | POA: Diagnosis not present

## 2021-04-24 LAB — GLUCOSE, CAPILLARY
Glucose-Capillary: 299 mg/dL — ABNORMAL HIGH (ref 70–99)
Glucose-Capillary: 320 mg/dL — ABNORMAL HIGH (ref 70–99)
Glucose-Capillary: 281 mg/dL — ABNORMAL HIGH (ref 70–99)
Glucose-Capillary: 479 mg/dL — ABNORMAL HIGH (ref 70–99)

## 2021-04-24 LAB — BASIC METABOLIC PANEL
Anion gap: 10 (ref 5–15)
BUN: 46 mg/dL — ABNORMAL HIGH (ref 8–23)
CO2: 27 mmol/L (ref 22–32)
Calcium: 8.1 mg/dL — ABNORMAL LOW (ref 8.9–10.3)
Chloride: 100 mmol/L (ref 98–111)
Creatinine, Ser: 1.65 mg/dL — ABNORMAL HIGH (ref 0.44–1.00)
GFR, Estimated: 33 mL/min — ABNORMAL LOW (ref >60)
Glucose, Bld: 270 mg/dL — ABNORMAL HIGH (ref 70–99)
Potassium: 4.3 mmol/L (ref 3.5–5.1)
Sodium: 137 mmol/L (ref 135–145)

## 2021-04-24 LAB — MAGNESIUM: Magnesium: 2 mg/dL (ref 1.7–2.4)

## 2021-04-24 MED ORDER — PHENOL 1.4 % MT LIQD
1.0000 | Freq: Three times a day (TID) | OROMUCOSAL | Status: DC
  Administered 2021-04-24 – 2021-04-26 (×5): 1 via OROMUCOSAL
  Filled 2021-04-24 (×2): qty 177

## 2021-04-24 MED ORDER — INSULIN GLARGINE-YFGN 100 UNIT/ML ~~LOC~~ SOLN
10.0000 [IU] | Freq: Every day | SUBCUTANEOUS | Status: DC
  Administered 2021-04-24: 12:00:00 10 [IU] via SUBCUTANEOUS
  Filled 2021-04-24 (×2): qty 0.1

## 2021-04-24 MED ORDER — METHYLPREDNISOLONE SODIUM SUCC 40 MG IJ SOLR
40.0000 mg | Freq: Two times a day (BID) | INTRAMUSCULAR | Status: DC
Start: 2021-04-24 — End: 2021-04-26
  Administered 2021-04-24 – 2021-04-26 (×4): 40 mg via INTRAVENOUS
  Filled 2021-04-24 (×4): qty 1

## 2021-04-24 MED ORDER — IPRATROPIUM-ALBUTEROL 0.5-2.5 (3) MG/3ML IN SOLN
3.0000 mL | Freq: Three times a day (TID) | RESPIRATORY_TRACT | Status: DC
  Administered 2021-04-25 – 2021-04-28 (×11): 3 mL via RESPIRATORY_TRACT
  Filled 2021-04-24 (×11): qty 3

## 2021-04-24 MED ORDER — INSULIN ASPART 100 UNIT/ML IJ SOLN
9.0000 [IU] | Freq: Once | INTRAMUSCULAR | Status: AC
  Administered 2021-04-24: 22:00:00 9 [IU] via SUBCUTANEOUS

## 2021-04-24 NOTE — Evaluation (Signed)
Physical Therapy Evaluation Patient Details Name: [name] MRN: [id number] DOB: [birth year] Today's Date: 04/24/2021  History of Present Illness  Patient is a 70 year old female who presented 04/23/21 for asthma flare.   PMH significant of asthma, CAD, CKD 3, CHF, depression, anxiety, diabetes, hypertension, GERD, hyperlipidemia, macrocytic anemia, pulmonary embolism, seizure  Clinical Impression  Pt admitted as above and presenting with functional mobility limitations 2* mild ambulatory balance deficits and decreased endurance.  This date pt up to ambulate in halls, occasionally touching rail and requiring multiple rest breads 2* SOB/fatigue but SaO2 maintained near 100% on RA.  Pt states she is not far from baseline and hopes to progress to dc home with support of family.     Recommendations for follow up therapy are one component of a multi-disciplinary discharge planning process, led by the attending physician.  Recommendations may be updated based on patient status, additional functional criteria and insurance authorization.  Follow Up Recommendations No PT follow up    Assistance Recommended at Discharge None  Functional Status Assessment Patient has had a recent decline in their functional status and demonstrates the ability to make significant improvements in function in a reasonable and predictable amount of time.  Equipment Recommendations  None recommended by PT    Recommendations for Other Services       Precautions / Restrictions Precautions Precautions: Fall Restrictions Weight Bearing Restrictions: No      Mobility  Bed Mobility Overal bed mobility: Independent             General bed mobility comments: no physical assist    Transfers Overall transfer level: Modified independent                 General transfer comment: No physical assist    Ambulation/Gait Ambulation/Gait assistance: Supervision Gait Distance (Feet): 200 Feet Assistive  device: None Gait Pattern/deviations: Step-through pattern;Shuffle;Wide base of support       General Gait Details: pt with slower than normal pace due to feeling short of breath; intermittently contacting rail in hall and admits to furniture walking at home when fatigued; multiple short standing rest breaks  Stairs            Wheelchair Mobility    Modified Rankin (Stroke Patients Only)       Balance Overall balance assessment: Mild deficits observed, not formally tested                                           Pertinent Vitals/Pain Pain Assessment: No/denies pain    Home Living Family/patient expects to be discharged to:: Private residence Living Arrangements: Spouse/significant other;Children;Other relatives Available Help at Discharge: Family;Available 24 hours/day Type of Home: House Home Access: Stairs to enter Entrance Stairs-Rails: Right;Left;Can reach both Entrance Stairs-Number of Steps: 3   Home Layout: One level Home Equipment: Counsellor (2 wheels) Additional Comments: Lives with husband, grandchildren and great-grandchildren (27 and 10 y.o.)    Prior Function Prior Level of Function : Independent/Modified Independent;Driving             Mobility Comments: no devices needed but admits to furniture walking at times       Hand Dominance   Dominant Hand: Left    Extremity/Trunk Assessment   Upper Extremity Assessment Upper Extremity Assessment: Overall WFL for tasks assessed    Lower Extremity Assessment Lower Extremity Assessment: Overall  WFL for tasks assessed    Cervical / Trunk Assessment Cervical / Trunk Assessment: Normal  Communication   Communication: No difficulties  Cognition Arousal/Alertness: Awake/alert Behavior During Therapy: WFL for tasks assessed/performed Overall Cognitive Status: Within Functional Limits for tasks assessed                                           General Comments      Exercises     Assessment/Plan    PT Assessment Patient needs continued PT services  PT Problem List Decreased balance       PT Treatment Interventions Functional mobility training;Balance training    PT Goals (Current goals can be found in the Care Plan section)  Acute Rehab PT Goals Patient Stated Goal: return home with breathing better PT Goal Formulation: With patient Time For Goal Achievement: 05/08/21 Potential to Achieve Goals: Good    Frequency Min 3X/week   Barriers to discharge        Co-evaluation               AM-PAC PT "6 Clicks" Mobility  Outcome Measure Help needed turning from your back to your side while in a flat bed without using bedrails?: None Help needed moving from lying on your back to sitting on the side of a flat bed without using bedrails?: None Help needed moving to and from a bed to a chair (including a wheelchair)?: None Help needed standing up from a chair using your arms (e.g., wheelchair or bedside chair)?: None Help needed to walk in hospital room?: A Little Help needed climbing 3-5 steps with a railing? : A Little 6 Click Score: 22    End of Session Equipment Utilized During Treatment: Gait belt Activity Tolerance: Patient tolerated treatment well Patient left: in chair;with call bell/phone within reach Nurse Communication: Mobility status;Other (comment) PT Visit Diagnosis: Difficulty in walking, not elsewhere classified (R26.2)    Time: 1610-9604 PT Time Calculation (min) (ACUTE ONLY): 17 min   Charges:   PT Evaluation $PT Eval Low Complexity: 1 Low          Debe Coder PT Acute Rehabilitation Services Pager [phone] Office [phone]   Jeneal Vogl 04/24/2021, 12:46 PM

## 2021-04-24 NOTE — Progress Notes (Signed)
PROGRESS NOTE    [name]  KXF:[id number] DOB: [birth year] DOA: 04/22/2021 PCP: [name], MD    Brief Narrative:  Mrs. Viti was admitted to the hospital with the working diagnosis of acute asthma exacerbation.    70 yo female with the past medical history of severe and persistent asthma, HTN, I9CV, chronic diastolic heart failure, CKD stage 3 and CAD who presented with dyspnea and wheezing. Recent hospitalization for asthma exacerbation due to metapneumovirus 12/2-12/03/2021. At home continue to have symptoms and her pulmonologist prescribed oral prednisone on a follow up visit on 12/21. Because persistent and worsening symptoms she came back to the hospital. On her initial physical examination her blood pressure was 128/76, HR 107, RR 17 and oxygen saturation 96%.  Her lungs had decreased air movement and expiratory wheezing, heart with S1 and S2 present and tachycardic, abdomen soft and no lower extremity edema.    Na 137, K 4,7, Cl 98, bicarbonate 28, glucose 1126, BUN 31 and cr at 1,44.  Wbc 13, hgb 10,4 and hct 32,0, Plt 175.  SARS COVID-19 negative.   Chest radiograph with hyperinflation but no infiltrates.    EKG 87 bpm, normal axis, normal intervals, sinus rhythm, no significant ST segment or T wave changes.   Patient was placed on systemic and inhaled corticosteroids along with aggressive bronchodilator therapy   Assessment & Plan:   Principal Problem:   Asthma exacerbation Active Problems:   Essential hypertension   G E R D   Diabetes mellitus type 2, controlled (HCC)   CAD in native artery   Chronic diastolic CHF (congestive heart failure) (HCC)   CKD (chronic kidney disease), stage III (HCC)   Acute asthma exacerbation  Continue to have dyspnea and wheezing, more prominent in the upper airway.  Out of bed to chair today.  Oxymetry is  95 to 97 on 3 L/min per [place]  Plan to continue with aggressive bronchodilator therapy  Systemic and inhaled  corticosteroids.  Oxymetry monitoring and supplemental 02 to keel 02 saturation 92% on greater.  Continue with airway clearing techniques with flutter valve and incentive spirometer, along with antitussive agents.   Will do trial of throat lozanges    2. Chronic diastolic heart failure, HTN. Continue with diltiazem, hydralazine and nebivolol for blood pressure control. No clinical signs of acute heart failure decompensation    3. T2DM with steroid induced hyperglycemia. dyslipidemia  Steroid induced hyperglycemia.   Will add basal insulin 10 units and continue with insulin sliding scale for glucose cover and monitoring. Patient is tolerating po well.   On atorvastatin.    4. CKD stage 3 a/ hypomagnesemia. Renal function with serum cr at 1,65 with K at 4,3 and serum bicarbonate at 27 Mg corrected at 2,0 Continue to hold on IV fluids and follow up renal function in am, avoid hypotension and nephrotoxic medications    5. Anemia of chronic disease.  Stable cell count, will plan to follow up as outpatient    6. Anxiety and depression,  On divalproex and alprazolam   Patient continue to be at high risk for worsening respiratory failure   Status is: Inpatient  Remains inpatient appropriate because: IV steroids and oxygen supplementation    DVT prophylaxis: Enoxaparin   Code Status:    full  Family Communication:   No family at the bedside      Subjective: Patient with improvement in dyspnea but not back to baseline, continue to have dyspnea on exertion, wheezing and cough.  Objective: Vitals:   04/23/21 2047 04/24/21 0130 04/24/21 0500 04/24/21 0551  BP: (!) 153/69   (!) 156/80  Pulse: 100   92  Resp: 18   18  Temp: 98.1 F (36.7 C)   98 F (36.7 C)  TempSrc: Oral   Oral  SpO2: 100% 97%  95%  Weight:   76.6 kg   Height:        Intake/Output Summary (Last 24 hours) at 04/24/2021 0941 Last data filed at 04/24/2021 0630 Gross per 24 hour  Intake 2190 ml  Output  2150 ml  Net 40 ml   Filed Weights   04/22/21 1028 04/24/21 0500  Weight: 75.8 kg 76.6 kg    Examination:   General: deconditioned and positive dyspnea at rest.  Neurology: Awake and alert, non focal  E ENT: mild pallor, no icterus, oral mucosa moist Cardiovascular: No JVD. S1-S2 present, rhythmic, no gallops, rubs, or murmurs. No lower extremity edema. Pulmonary: positive breath sounds bilaterally, improved air movement but continue with prolonged expiratory phase, expiratory wheezing more in the proximal airway, with no rhonchi or rales. Gastrointestinal. Abdomen soft and non tender Skin. No rashes Musculoskeletal: no joint deformities     Data Reviewed: I have personally reviewed following labs and imaging studies  CBC: Recent Labs  Lab 04/22/21 1208 04/23/21 0440  WBC 13.6* 9.9  NEUTROABS 9.0* 8.8*  HGB 10.4* 9.5*  HCT 32.0* 28.9*  MCV 94.1 95.1  PLT 175 818   Basic Metabolic Panel: Recent Labs  Lab 04/22/21 1208 04/23/21 0440 04/24/21 0508  NA 137 137 137  K 4.0 4.7 4.3  CL 98 99 100  CO2 28 25 27   GLUCOSE 126* 340* 270*  BUN 31* 36* 46*  CREATININE 1.44* 1.51* 1.65*  CALCIUM 8.3* 8.1* 8.1*  MG  --  1.2* 2.0   GFR: Estimated Creatinine Clearance: 29.5 mL/min (A) (by C-G formula based on SCr of 1.65 mg/dL (H)). Liver Function Tests: Recent Labs  Lab 04/23/21 0440  AST 20  ALT 22  ALKPHOS 48  BILITOT 0.5  PROT 6.9  ALBUMIN 3.7   No results for input(s): LIPASE, AMYLASE in the last 168 hours. No results for input(s): AMMONIA in the last 168 hours. Coagulation Profile: No results for input(s): INR, PROTIME in the last 168 hours. Cardiac Enzymes: No results for input(s): CKTOTAL, CKMB, CKMBINDEX, TROPONINI in the last 168 hours. BNP (last 3 results) No results for input(s): PROBNP in the last 8760 hours. HbA1C: No results for input(s): HGBA1C in the last 72 hours. CBG: Recent Labs  Lab 04/23/21 1636 04/23/21 2202 04/24/21 0812  GLUCAP  383* 420* 299*   Lipid Profile: No results for input(s): CHOL, HDL, LDLCALC, TRIG, CHOLHDL, LDLDIRECT in the last 72 hours. Thyroid Function Tests: No results for input(s): TSH, T4TOTAL, FREET4, T3FREE, THYROIDAB in the last 72 hours. Anemia Panel: No results for input(s): VITAMINB12, FOLATE, FERRITIN, TIBC, IRON, RETICCTPCT in the last 72 hours.    Radiology Studies: I have reviewed all of the imaging during this hospital visit personally     Scheduled Meds:  atorvastatin  40 mg Oral QHS   diltiazem  240 mg Oral Daily   divalproex  500 mg Oral QHS   enoxaparin (LOVENOX) injection  40 mg Subcutaneous Q24H   hydrALAZINE  50 mg Oral TID   insulin aspart  0-15 Units Subcutaneous TID WC   ipratropium-albuterol  3 mL Nebulization Q6H   memantine  10 mg Oral BID   methylPREDNISolone (SOLU-MEDROL)  injection  81.25 mg Intravenous Daily   mometasone-formoterol  2 puff Inhalation BID   montelukast  10 mg Oral QHS   nebivolol  10 mg Oral Daily   pantoprazole  40 mg Oral Daily   Continuous Infusions:   LOS: 2 days        Latravis Grine Gerome Apley, MD

## 2021-04-24 NOTE — Evaluation (Signed)
Occupational Therapy Evaluation Patient Details Name: [name] MRN: [id number] DOB: 1951-04-14 Today's Date: 04/24/2021   History of Present Illness Patient is a 70 year old female who presented 04/23/21 for asthma flare.   PMH significant of asthma, CAD, CKD 3, CHF, depression, anxiety, diabetes, hypertension, GERD, hyperlipidemia, macrocytic anemia, pulmonary embolism, seizure   Clinical Impression   Patient evaluated by Occupational Therapy with no further acute OT needs identified. All education has been completed and the patient has no further questions. Patient endorses being at baseline for ADLs at this time. Patient demonstrated being MI at ADLs in room on this date. Patient was able to maintain O2 saturation 98-100% on RA with activity. Patient was educated on ECT. Patient verbalized understanding. Patient reported having all equipment needs already at home.  See below for any follow-up Occupational Therapy or equipment needs. OT is signing off. Thank you for this referral.       Recommendations for follow up therapy are one component of a multi-disciplinary discharge planning process, led by the attending physician.  Recommendations may be updated based on patient status, additional functional criteria and insurance authorization.   Follow Up Recommendations  No OT follow up    Assistance Recommended at Discharge PRN  Functional Status Assessment  Patient has not had a recent decline in their functional status  Equipment Recommendations  None recommended by OT    Recommendations for Other Services       Precautions / Restrictions Precautions Precautions: None Restrictions Weight Bearing Restrictions: No      Mobility Bed Mobility Overal bed mobility: Independent                  Transfers Overall transfer level: Independent                        Balance Overall balance assessment: Independent                                          ADL either performed or assessed with clinical judgement   ADL Overall ADL's : Modified independent                                       General ADL Comments: patient is able to complete all ADLs LB dressing, UB dressing, functional mobility in hallways and transfers with MI with noted SOB. patients O2 maintained 98-100% on RA with SOB. patient reported SOB is getting a little better and was better than last hosptial admission two weeks prior. patient endorses being at baseline for ADLs at this time. patient reported having taken herself to bathroom this AM and washed up.     Vision Patient Visual Report: No change from baseline       Perception     Praxis      Pertinent Vitals/Pain Pain Assessment: No/denies pain     Hand Dominance Left   Extremity/Trunk Assessment Upper Extremity Assessment Upper Extremity Assessment: Overall WFL for tasks assessed   Lower Extremity Assessment Lower Extremity Assessment: Defer to PT evaluation   Cervical / Trunk Assessment Cervical / Trunk Assessment: Normal   Communication Communication Communication: No difficulties   Cognition Arousal/Alertness: Awake/alert Behavior During Therapy: WFL for tasks assessed/performed Overall Cognitive Status: Within Functional Limits for tasks assessed  General Comments       Exercises     Shoulder Instructions      Home Living Family/patient expects to be discharged to:: Private residence Living Arrangements: Spouse/significant other;Children;Other relatives Available Help at Discharge: Family;Available 24 hours/day Type of Home: House Home Access: Stairs to enter [employer] of Steps: 3 Entrance Stairs-Rails: Right;Left;Can reach both Home Layout: One level     Bathroom Shower/Tub: Occupational psychologist: Handicapped height     Home Equipment: Counsellor (2 wheels)    Additional Comments: Lives with husband, grandchildren and great-grandchildren (46 and 82 y.o.)      Prior Functioning/Environment Prior Level of Function : Independent/Modified Independent;Driving             Mobility Comments: no devices needed          OT Problem List:        OT Treatment/Interventions:      OT Goals(Current goals can be found in the care plan section) Acute Rehab OT Goals OT Goal Formulation: All assessment and education complete, DC therapy  OT Frequency:     Barriers to D/C:            Co-evaluation              AM-PAC OT "6 Clicks" Daily Activity     Outcome Measure Help from another person eating meals?: None Help from another person taking care of personal grooming?: None Help from another person toileting, which includes using toliet, bedpan, or urinal?: None Help from another person bathing (including washing, rinsing, drying)?: None Help from another person to put on and taking off regular upper body clothing?: None Help from another person to put on and taking off regular lower body clothing?: None 6 Click Score: 24   End of Session Equipment Utilized During Treatment: Gait belt Nurse Communication: Other (comment) (cleared patient to participate)  Activity Tolerance: Patient tolerated treatment well Patient left: in chair;with call bell/phone within reach  OT Visit Diagnosis: Unsteadiness on feet (R26.81)                Time: 6979-4801 OT Time Calculation (min): 27 min Charges:  OT General Charges $OT Visit: 1 Visit OT Evaluation $OT Eval Low Complexity: 1 Low  Leota Sauers, MS Acute Rehabilitation Department Office# [phone] Pager# [phone]   Marcellina Millin 04/24/2021, 10:31 AM

## 2021-04-25 DIAGNOSIS — J45901 Unspecified asthma with (acute) exacerbation: Secondary | ICD-10-CM | POA: Diagnosis not present

## 2021-04-25 DIAGNOSIS — J4551 Severe persistent asthma with (acute) exacerbation: Secondary | ICD-10-CM | POA: Diagnosis not present

## 2021-04-25 DIAGNOSIS — I5032 Chronic diastolic (congestive) heart failure: Secondary | ICD-10-CM | POA: Diagnosis not present

## 2021-04-25 DIAGNOSIS — N1831 Chronic kidney disease, stage 3a: Secondary | ICD-10-CM

## 2021-04-25 LAB — BASIC METABOLIC PANEL
Anion gap: 8 (ref 5–15)
Anion gap: 10 (ref 5–15)
BUN: 40 mg/dL — ABNORMAL HIGH (ref 8–23)
BUN: 36 mg/dL — ABNORMAL HIGH (ref 8–23)
CO2: 26 mmol/L (ref 22–32)
CO2: 23 mmol/L (ref 22–32)
Calcium: 8 mg/dL — ABNORMAL LOW (ref 8.9–10.3)
Calcium: 7.9 mg/dL — ABNORMAL LOW (ref 8.9–10.3)
Chloride: 103 mmol/L (ref 98–111)
Chloride: 99 mmol/L (ref 98–111)
Creatinine, Ser: 1.47 mg/dL — ABNORMAL HIGH (ref 0.44–1.00)
Creatinine, Ser: 1.32 mg/dL — ABNORMAL HIGH (ref 0.44–1.00)
GFR, Estimated: 38 mL/min — ABNORMAL LOW (ref >60)
GFR, Estimated: 43 mL/min — ABNORMAL LOW (ref >60)
Glucose, Bld: 324 mg/dL — ABNORMAL HIGH (ref 70–99)
Glucose, Bld: 498 mg/dL — ABNORMAL HIGH (ref 70–99)
Potassium: 4.8 mmol/L (ref 3.5–5.1)
Potassium: 4.6 mmol/L (ref 3.5–5.1)
Sodium: 137 mmol/L (ref 135–145)
Sodium: 132 mmol/L — ABNORMAL LOW (ref 135–145)

## 2021-04-25 LAB — GLUCOSE, CAPILLARY
Glucose-Capillary: 283 mg/dL — ABNORMAL HIGH (ref 70–99)
Glucose-Capillary: 303 mg/dL — ABNORMAL HIGH (ref 70–99)
Glucose-Capillary: 475 mg/dL — ABNORMAL HIGH (ref 70–99)
Glucose-Capillary: 332 mg/dL — ABNORMAL HIGH (ref 70–99)

## 2021-04-25 MED ORDER — INSULIN GLARGINE-YFGN 100 UNIT/ML ~~LOC~~ SOLN
14.0000 [IU] | Freq: Every day | SUBCUTANEOUS | Status: DC
  Administered 2021-04-25: 12:00:00 14 [IU] via SUBCUTANEOUS
  Filled 2021-04-25: qty 0.14

## 2021-04-25 MED ORDER — INSULIN ASPART 100 UNIT/ML IJ SOLN
20.0000 [IU] | Freq: Once | INTRAMUSCULAR | Status: AC
  Administered 2021-04-25: 18:00:00 20 [IU] via SUBCUTANEOUS

## 2021-04-25 MED ORDER — LIDOCAINE 5 % EX PTCH
1.0000 | MEDICATED_PATCH | CUTANEOUS | Status: DC
  Administered 2021-04-25: 21:00:00 1 via TRANSDERMAL
  Filled 2021-04-25: qty 1

## 2021-04-25 MED ORDER — MENTHOL 3 MG MT LOZG
1.0000 | LOZENGE | Freq: Three times a day (TID) | OROMUCOSAL | Status: DC
  Administered 2021-04-25 – 2021-04-26 (×4): 3 mg via ORAL
  Filled 2021-04-25 (×5): qty 9

## 2021-04-25 MED ORDER — INSULIN ASPART 100 UNIT/ML IJ SOLN
6.0000 [IU] | Freq: Three times a day (TID) | INTRAMUSCULAR | Status: DC
  Administered 2021-04-25: 18:00:00 6 [IU] via SUBCUTANEOUS

## 2021-04-25 MED ORDER — BUDESONIDE 0.5 MG/2ML IN SUSP
0.5000 mg | Freq: Two times a day (BID) | RESPIRATORY_TRACT | Status: DC
  Administered 2021-04-25 – 2021-04-28 (×7): 0.5 mg via RESPIRATORY_TRACT
  Filled 2021-04-25 (×7): qty 2

## 2021-04-25 MED ORDER — INSULIN GLARGINE-YFGN 100 UNIT/ML ~~LOC~~ SOLN
20.0000 [IU] | Freq: Every day | SUBCUTANEOUS | Status: DC
  Administered 2021-04-26 – 2021-04-28 (×3): 20 [IU] via SUBCUTANEOUS
  Filled 2021-04-25 (×3): qty 0.2

## 2021-04-25 MED ORDER — ALUM & MAG HYDROXIDE-SIMETH 200-200-20 MG/5ML PO SUSP
30.0000 mL | ORAL | Status: DC | PRN
  Administered 2021-04-25 – 2021-04-28 (×2): 30 mL via ORAL
  Filled 2021-04-25 (×2): qty 30

## 2021-04-25 MED ORDER — INSULIN GLARGINE-YFGN 100 UNIT/ML ~~LOC~~ SOLN
6.0000 [IU] | Freq: Once | SUBCUTANEOUS | Status: AC
  Administered 2021-04-25: 18:00:00 6 [IU] via SUBCUTANEOUS
  Filled 2021-04-25: qty 0.06

## 2021-04-25 NOTE — Progress Notes (Signed)
PROGRESS NOTE    [name]  XFG:[id number] DOB: 08-May-1950 DOA: 04/22/2021 PCP: [name], MD (Confirm with patient/family/NH records and if not entered, this HAS to be entered at [facility] point of entry. "No PCP" if truly none.)   Chief Complaint  Patient presents with   Shortness of Breath    Brief Narrative:  [name] was admitted to the hospital with the working diagnosis of acute asthma exacerbation.    70 yo female with the past medical history of severe and persistent asthma, HTN, R6VE, chronic diastolic heart failure, CKD stage 3 and CAD who presented with dyspnea and wheezing. Recent hospitalization for asthma exacerbation due to metapneumovirus 12/2-12/03/2021. At home continue to have symptoms and her pulmonologist prescribed oral prednisone on a follow up visit on 12/21. Because persistent and worsening symptoms she came back to the hospital. On her initial physical examination her blood pressure was 128/76, HR 107, RR 17 and oxygen saturation 96%.  Her lungs had decreased air movement and expiratory wheezing, heart with S1 and S2 present and tachycardic, abdomen soft and no lower extremity edema.    Na 137, K 4,7, Cl 98, bicarbonate 28, glucose 1126, BUN 31 and cr at 1,44.  Wbc 13, hgb 10,4 and hct 32,0, Plt 175.  SARS COVID-19 negative.   Chest radiograph with hyperinflation but no infiltrates.    EKG 87 bpm, normal axis, normal intervals, sinus rhythm, no significant ST segment or T wave changes.    Patient was placed on systemic and inhaled corticosteroids along with aggressive bronchodilator    Assessment & Plan:   Principal Problem:   Asthma exacerbation Active Problems:   Essential hypertension   G E R D   Diabetes mellitus type 2, controlled (HCC)   CAD in native artery   Chronic diastolic CHF (congestive heart failure) (HCC)   CKD (chronic kidney disease), stage III (HCC)  #1 acute asthma exacerbation -Likely triggered by recent  metapneumovirus. -Patient with ongoing dyspnea and wheezing, upper airway noise. -Hypoxia improving. -Patient currently on 1 L nasal cannula with sats of 98 200%. -Continue IV Solu-Medrol, Singulair, PPI. -Placed on Pulmicort, scheduled duo nebs. -Cepacol lozenges 3 times daily. -Supportive care.  2.  Chronic diastolic CHF/hypertension -Continue diltiazem, hydralazine, nebivolol for blood pressure control. -No signs of volume overload on examination. -Outpatient follow-up.  3.  Type 2 diabetes mellitus with steroid-induced hyperglycemia/dyslipidemia -Hemoglobin A1c 6.8 (04/05/2021) -CBGs elevated likely steroid-induced. -Increase Semglee to 14 units daily, SSI. -Continue statin.  4.  CKD stage IIIa/hypomagnesemia -Magnesium corrected at 2.0. -Creatinine currently at 1.47. -Follow-up.  5.  Anemia of chronic disease -Follow H&H.  6.  Depression/anxiety -Continue Depakote, alprazolam.     DVT prophylaxis: Lovenox Code Status: Full Family Communication: Updated patient.  No family at bedside. Disposition:   Status is: Inpatient  Remains inpatient appropriate because: Severity of illness       Consultants:  None  Procedures:  Chest x-ray 04/22/2021  Antimicrobials:  None   Subjective: Sitting up in chair.  Still with complaints of shortness of breath.  States slight improvement since admission.  No chest pain.  Still with complaints of wheezing.  Cough with small productive yellowish sputum.  Objective: Vitals:   04/25/21 0500 04/25/21 0540 04/25/21 0855 04/25/21 1025  BP:  (!) 167/92    Pulse:  92    Resp:  18    Temp:  98.3 F (36.8 C)    TempSrc:  Oral    SpO2:  100% 100%  98%  Weight: 76.8 kg     Height:        Intake/Output Summary (Last 24 hours) at 04/25/2021 1111 Last data filed at 04/25/2021 1000 Gross per 24 hour  Intake 1710 ml  Output 500 ml  Net 1210 ml   Filed Weights   04/22/21 1028 04/24/21 0500 04/25/21 0500  Weight: 75.8 kg  76.6 kg 76.8 kg    Examination:  General exam: Appears calm and comfortable  Respiratory system: Upper airway noise/upper airway wheezing.  Some choppy sentences.  Poor air movement.  Shallow breaths.   Cardiovascular system: S1 & S2 heard, RRR. No JVD, murmurs, rubs, gallops or clicks. No pedal edema. Gastrointestinal system: Abdomen is nondistended, soft and nontender. No organomegaly or masses felt. Normal bowel sounds heard. Central nervous system: Alert and oriented. No focal neurological deficits. Extremities: Symmetric 5 x 5 power. Skin: No rashes, lesions or ulcers Psychiatry: Judgement and insight appear normal. Mood & affect appropriate.     Data Reviewed: I have personally reviewed following labs and imaging studies  CBC: Recent Labs  Lab 04/22/21 1208 04/23/21 0440  WBC 13.6* 9.9  NEUTROABS 9.0* 8.8*  HGB 10.4* 9.5*  HCT 32.0* 28.9*  MCV 94.1 95.1  PLT 175 016    Basic Metabolic Panel: Recent Labs  Lab 04/22/21 1208 04/23/21 0440 04/24/21 0508 04/25/21 0421  NA 137 137 137 137  K 4.0 4.7 4.3 4.8  CL 98 99 100 103  CO2 28 25 27 26   GLUCOSE 126* 340* 270* 324*  BUN 31* 36* 46* 40*  CREATININE 1.44* 1.51* 1.65* 1.47*  CALCIUM 8.3* 8.1* 8.1* 8.0*  MG  --  1.2* 2.0  --     GFR: Estimated Creatinine Clearance: 33.2 mL/min (A) (by C-G formula based on SCr of 1.47 mg/dL (H)).  Liver Function Tests: Recent Labs  Lab 04/23/21 0440  AST 20  ALT 22  ALKPHOS 48  BILITOT 0.5  PROT 6.9  ALBUMIN 3.7    CBG: Recent Labs  Lab 04/24/21 0812 04/24/21 1148 04/24/21 1630 04/24/21 2110 04/25/21 0735  GLUCAP 299* 320* 281* 479* 283*     Recent Results (from the past 240 hour(s))  Resp Panel by RT-PCR (Flu A&B, Covid) Nasopharyngeal Swab     Status: None   Collection Time: 04/22/21  2:51 PM   Specimen: Nasopharyngeal Swab; Nasopharyngeal(NP) swabs in vial transport medium  Result Value Ref Range Status   SARS Coronavirus 2 by RT PCR NEGATIVE  NEGATIVE Final    Comment: (NOTE) SARS-CoV-2 target nucleic acids are NOT DETECTED.  The SARS-CoV-2 RNA is generally detectable in upper respiratory specimens during the acute phase of infection. The lowest concentration of SARS-CoV-2 viral copies this assay can detect is 138 copies/mL. A negative result does not preclude SARS-Cov-2 infection and should not be used as the sole basis for treatment or other patient management decisions. A negative result may occur with  improper specimen collection/handling, submission of specimen other than nasopharyngeal swab, presence of viral mutation(s) within the areas targeted by this assay, and inadequate number of viral copies(<138 copies/mL). A negative result must be combined with clinical observations, patient history, and epidemiological information. The expected result is Negative.  Fact Sheet for Patients:  EntrepreneurPulse.com.au  Fact Sheet for Healthcare Providers:  IncredibleEmployment.be  This test is no t yet approved or cleared by the [place] FDA and  has been authorized for detection and/or diagnosis of SARS-CoV-2 by FDA under an Emergency Use Authorization (EUA). This EUA  will remain  in effect (meaning this test can be used) for the duration of the COVID-19 declaration under Section 564(b)(1) of the Act, 21 U.S.C.section 360bbb-3(b)(1), unless the authorization is terminated  or revoked sooner.       Influenza A by PCR NEGATIVE NEGATIVE Final   Influenza B by PCR NEGATIVE NEGATIVE Final    Comment: (NOTE) The Xpert Xpress SARS-CoV-2/FLU/RSV plus assay is intended as an aid in the diagnosis of influenza from Nasopharyngeal swab specimens and should not be used as a sole basis for treatment. Nasal washings and aspirates are unacceptable for Xpert Xpress SARS-CoV-2/FLU/RSV testing.  Fact Sheet for Patients: EntrepreneurPulse.com.au  Fact Sheet for Healthcare  Providers: IncredibleEmployment.be  This test is not yet approved or cleared by the [place] FDA and has been authorized for detection and/or diagnosis of SARS-CoV-2 by FDA under an Emergency Use Authorization (EUA). This EUA will remain in effect (meaning this test can be used) for the duration of the COVID-19 declaration under Section 564(b)(1) of the Act, 21 U.S.C. section 360bbb-3(b)(1), unless the authorization is terminated or revoked.  Performed at [facility], [address] [place], [place] [address]          Radiology Studies: No results found.      Scheduled Meds:  atorvastatin  40 mg Oral QHS   budesonide (PULMICORT) nebulizer solution  0.5 mg Nebulization BID   diltiazem  240 mg Oral Daily   divalproex  500 mg Oral QHS   enoxaparin (LOVENOX) injection  40 mg Subcutaneous Q24H   hydrALAZINE  50 mg Oral TID   insulin aspart  0-15 Units Subcutaneous TID WC   insulin glargine-yfgn  14 Units Subcutaneous Daily   ipratropium-albuterol  3 mL Nebulization TID   memantine  10 mg Oral BID   methylPREDNISolone (SOLU-MEDROL) injection  40 mg Intravenous Q12H   montelukast  10 mg Oral QHS   nebivolol  10 mg Oral Daily   pantoprazole  40 mg Oral Daily   phenol  1 spray Mouth/Throat TID   Continuous Infusions:   LOS: 3 days    Time spent: 40 minutes    Irine Seal, MD [facility]   To contact the attending provider between 7A-7P or the covering provider during after hours 7P-7A, please log into the web site [email or web address] and access using universal [place] password for that web site. If you do not have the password, please call the hospital operator.  04/25/2021, 11:11 AM

## 2021-04-25 NOTE — Progress Notes (Signed)
Mobility Specialist - Progress Note    04/25/21 1025  Oxygen Therapy  SpO2 98 %  O2 Device Nasal Cannula  O2 Flow Rate (L/min) 1 L/min  Mobility  Activity Ambulated in hall  Level of Assistance Modified independent, requires aide device or extra time  Assistive Device None  Distance Ambulated (ft) 240 ft  Mobility Ambulated with assistance in hallway  Mobility Response Tolerated well  Mobility performed by Mobility specialist  $Mobility charge 1 Mobility   Upon entry pt was agreeable to mobilize and stayed connected to 1L of O2 during ambulation. Pt ambulated 240 ft in hallway and would occasionally reach for the rails. 3 standing rest breaks needed during session to help pt practice pursed breathing due to experiencing SOB and wheezing. After session, pt returned to room and was left sitting up in recliner with call bell at side.   [place] Specialist Acute Rehabilitation Services Phone: [phone] 04/25/21, 10:28 AM

## 2021-04-25 NOTE — Progress Notes (Signed)
Inpatient Diabetes Program Recommendations  AACE/ADA: New Consensus Statement on Inpatient Glycemic Control (2015)  Target Ranges:  Prepandial:   less than 140 mg/dL      Peak postprandial:   less than 180 mg/dL (1-2 hours)      Critically ill patients:  140 - 180 mg/dL    Latest Reference Range & Units 04/24/21 08:12 04/24/21 11:48 04/24/21 16:30 04/24/21 21:10  Glucose-Capillary 70 - 99 mg/dL 299 (H)  8 units Novolog  320 (H)  11 units Novolog  10 units Semglee  281 (H)  8 units Novolog  479 (H)  9 units Novolog     Latest Reference Range & Units 04/25/21 07:35 04/25/21 11:53  Glucose-Capillary 70 - 99 mg/dL 283 (H)  8 units Novolog  303 (H)  11 units Novolog  14 units Semglee      Home DM Meds: Metformin 500 mg BID    Current Orders: Semglee 14 units daily Novolog 0-15 units TID    Semglee started yest AM and increased this AM (got 10 units Semglee yest).    Solumedrol adjusted to 40 mg BID today (got total of 120 mg Solumedrol yest).   MD- Please consider the following while pt remains on Solumedrol:  1. Increase Semglee further to 20 units Daily (0.25 units/kg)  2. Start Novolog Meal Coverage: Novolog 6 units TID with meals  Hold if pt eats <50% of meal, Hold if pt NPO    --Will follow patient during hospitalization--  Wyn Quaker RN, MSN, CDE Diabetes Coordinator Inpatient Glycemic Control Team Team Pager: [phone] (8a-5p)

## 2021-04-25 NOTE — Progress Notes (Signed)
Notified MD of cbg of 475

## 2021-04-26 DIAGNOSIS — J4551 Severe persistent asthma with (acute) exacerbation: Secondary | ICD-10-CM | POA: Diagnosis not present

## 2021-04-26 DIAGNOSIS — I5032 Chronic diastolic (congestive) heart failure: Secondary | ICD-10-CM | POA: Diagnosis not present

## 2021-04-26 DIAGNOSIS — N1831 Chronic kidney disease, stage 3a: Secondary | ICD-10-CM | POA: Diagnosis not present

## 2021-04-26 DIAGNOSIS — J45901 Unspecified asthma with (acute) exacerbation: Secondary | ICD-10-CM | POA: Diagnosis not present

## 2021-04-26 LAB — CBC WITH DIFFERENTIAL/PLATELET
Abs Immature Granulocytes: 0.23 10*3/uL — ABNORMAL HIGH (ref 0.00–0.07)
Basophils Absolute: 0 10*3/uL (ref 0.0–0.1)
Basophils Relative: 0 %
Eosinophils Absolute: 0 10*3/uL (ref 0.0–0.5)
Eosinophils Relative: 0 %
HCT: 29.7 % — ABNORMAL LOW (ref 36.0–46.0)
Hemoglobin: 9.8 g/dL — ABNORMAL LOW (ref 12.0–15.0)
Immature Granulocytes: 2 %
Lymphocytes Relative: 7 %
Lymphs Abs: 0.8 10*3/uL (ref 0.7–4.0)
MCH: 31.1 pg (ref 26.0–34.0)
MCHC: 33 g/dL (ref 30.0–36.0)
MCV: 94.3 fL (ref 80.0–100.0)
Monocytes Absolute: 0.5 10*3/uL (ref 0.1–1.0)
Monocytes Relative: 5 %
Neutro Abs: 8.9 10*3/uL — ABNORMAL HIGH (ref 1.7–7.7)
Neutrophils Relative %: 86 %
Platelets: 177 10*3/uL (ref 150–400)
RBC: 3.15 MIL/uL — ABNORMAL LOW (ref 3.87–5.11)
RDW: 14 % (ref 11.5–15.5)
WBC: 10.4 10*3/uL (ref 4.0–10.5)
nRBC: 0.5 % — ABNORMAL HIGH (ref 0.0–0.2)

## 2021-04-26 LAB — BASIC METABOLIC PANEL
Anion gap: 9 (ref 5–15)
BUN: 30 mg/dL — ABNORMAL HIGH (ref 8–23)
CO2: 26 mmol/L (ref 22–32)
Calcium: 8.2 mg/dL — ABNORMAL LOW (ref 8.9–10.3)
Chloride: 102 mmol/L (ref 98–111)
Creatinine, Ser: 1.16 mg/dL — ABNORMAL HIGH (ref 0.44–1.00)
GFR, Estimated: 50 mL/min — ABNORMAL LOW (ref >60)
Glucose, Bld: 292 mg/dL — ABNORMAL HIGH (ref 70–99)
Potassium: 4.7 mmol/L (ref 3.5–5.1)
Sodium: 137 mmol/L (ref 135–145)

## 2021-04-26 LAB — GLUCOSE, CAPILLARY
Glucose-Capillary: 255 mg/dL — ABNORMAL HIGH (ref 70–99)
Glucose-Capillary: 265 mg/dL — ABNORMAL HIGH (ref 70–99)
Glucose-Capillary: 270 mg/dL — ABNORMAL HIGH (ref 70–99)
Glucose-Capillary: 315 mg/dL — ABNORMAL HIGH (ref 70–99)
Glucose-Capillary: 359 mg/dL — ABNORMAL HIGH (ref 70–99)

## 2021-04-26 MED ORDER — HYDRALAZINE HCL 25 MG PO TABS
25.0000 mg | ORAL_TABLET | Freq: Once | ORAL | Status: AC
  Administered 2021-04-26: 18:00:00 25 mg via ORAL
  Filled 2021-04-26: qty 1

## 2021-04-26 MED ORDER — HYDRALAZINE HCL 50 MG PO TABS
100.0000 mg | ORAL_TABLET | Freq: Three times a day (TID) | ORAL | Status: DC
  Administered 2021-04-26 – 2021-04-28 (×6): 100 mg via ORAL
  Filled 2021-04-26 (×6): qty 2

## 2021-04-26 MED ORDER — PREDNISONE 20 MG PO TABS
60.0000 mg | ORAL_TABLET | Freq: Every day | ORAL | Status: DC
  Administered 2021-04-27: 09:00:00 60 mg via ORAL
  Filled 2021-04-26: qty 3

## 2021-04-26 MED ORDER — METHYLPREDNISOLONE SODIUM SUCC 40 MG IJ SOLR: 40.0000 mg | Freq: Every day | INTRAMUSCULAR | Status: DC

## 2021-04-26 MED ORDER — HYDROCODONE BIT-HOMATROP MBR 5-1.5 MG/5ML PO SOLN
5.0000 mL | Freq: Four times a day (QID) | ORAL | Status: DC | PRN
  Administered 2021-04-26 – 2021-04-28 (×8): 5 mL via ORAL
  Filled 2021-04-26 (×8): qty 5

## 2021-04-26 MED ORDER — INSULIN ASPART 100 UNIT/ML IJ SOLN
10.0000 [IU] | Freq: Three times a day (TID) | INTRAMUSCULAR | Status: DC
  Administered 2021-04-26 – 2021-04-28 (×6): 10 [IU] via SUBCUTANEOUS

## 2021-04-26 MED ORDER — HYDRALAZINE HCL 50 MG PO TABS
75.0000 mg | ORAL_TABLET | Freq: Three times a day (TID) | ORAL | Status: DC
  Administered 2021-04-26 (×2): 75 mg via ORAL
  Filled 2021-04-26 (×2): qty 1

## 2021-04-26 NOTE — Progress Notes (Signed)
Physical Therapy Treatment Patient Details Name: [name] MRN: [id number] DOB: 08/31/1950 Today's Date: 04/26/2021   History of Present Illness Patient is a 71 year old female who presented 04/23/21 for asthma flare.   PMH significant of asthma, CAD, CKD 3, CHF, depression, anxiety, diabetes, hypertension, GERD, hyperlipidemia, macrocytic anemia, pulmonary embolism, seizure    PT Comments    Pt is progressing well, reviewed  importance of RPE, self monitoring fatigue level at home, Energy conservation as well as progression of activity.  Reviewed flutter valve and IS, pt able to return demo   Recommendations for follow up therapy are one component of a multi-disciplinary discharge planning process, led by the attending physician.  Recommendations may be updated based on patient status, additional functional criteria and insurance authorization.  Follow Up Recommendations  No PT follow up     Assistance Recommended at Discharge None  Equipment Recommendations  None recommended by PT    Recommendations for Other Services       Precautions / Restrictions Precautions Precautions: Fall Restrictions Weight Bearing Restrictions: No     Mobility  Bed Mobility               General bed mobility comments: in recliner    Transfers Overall transfer level: Modified independent                      Ambulation/Gait Ambulation/Gait assistance: Supervision Gait Distance (Feet): 100 Feet Assistive device: None Gait Pattern/deviations: Step-through pattern;Decreased stride length       General Gait Details: slower than normal pace, 2 standing rests d/t slight DOE. on RA on arrival, unabel to get SpO2 reading d/t length of pt nails. reviewed RPE, importance of self monitoring at home   Stairs             Wheelchair Mobility    Modified Rankin (Stroke Patients Only)       Balance                                             Cognition Arousal/Alertness: Awake/alert Behavior During Therapy: WFL for tasks assessed/performed Overall Cognitive Status: Within Functional Limits for tasks assessed                                          Exercises      General Comments        Pertinent Vitals/Pain Pain Assessment: No/denies pain    Home Living                          Prior Function            PT Goals (current goals can now be found in the care plan section) Acute Rehab PT Goals Patient Stated Goal: return home with breathing better PT Goal Formulation: With patient Time For Goal Achievement: 05/08/21 Potential to Achieve Goals: Good Progress towards PT goals: Progressing toward goals    Frequency    Min 3X/week      PT Plan Current plan remains appropriate    Co-evaluation              AM-PAC PT "6 Clicks" Mobility   Outcome Measure  Help needed turning from your  back to your side while in a flat bed without using bedrails?: None Help needed moving from lying on your back to sitting on the side of a flat bed without using bedrails?: None Help needed moving to and from a bed to a chair (including a wheelchair)?: None Help needed standing up from a chair using your arms (e.g., wheelchair or bedside chair)?: None Help needed to walk in hospital room?: A Little Help needed climbing 3-5 steps with a railing? : A Little 6 Click Score: 22    End of Session   Activity Tolerance: Patient tolerated treatment well Patient left: in chair;with call bell/phone within reach   PT Visit Diagnosis: Difficulty in walking, not elsewhere classified (R26.2)     Time: 8381-8403 PT Time Calculation (min) (ACUTE ONLY): 9 min  Charges:  $Gait Training: 8-22 mins                     Baxter Flattery, PT  Acute Rehab Dept ([place]) [phone] Pager [phone]  04/26/2021    [facility] 04/26/2021, 12:01 PM

## 2021-04-26 NOTE — Progress Notes (Addendum)
PROGRESS NOTE    [name]  PIR:[id number] DOB: 12/21/1950 DOA: 04/22/2021 PCP: [name], MD   Chief Complaint  Patient presents with   Shortness of Breath    Brief Narrative:  [name] was admitted to the hospital with the working diagnosis of acute asthma exacerbation.    70 yo female with the past medical history of severe and persistent asthma, HTN, Y3KZ, chronic diastolic heart failure, CKD stage 3 and CAD who presented with dyspnea and wheezing. Recent hospitalization for asthma exacerbation due to metapneumovirus 12/2-12/03/2021. At home continue to have symptoms and her pulmonologist prescribed oral prednisone on a follow up visit on 12/21. Because persistent and worsening symptoms she came back to the hospital. On her initial physical examination her blood pressure was 128/76, HR 107, RR 17 and oxygen saturation 96%.  Her lungs had decreased air movement and expiratory wheezing, heart with S1 and S2 present and tachycardic, abdomen soft and no lower extremity edema.    Na 137, K 4,7, Cl 98, bicarbonate 28, glucose 1126, BUN 31 and cr at 1,44.  Wbc 13, hgb 10,4 and hct 32,0, Plt 175.  SARS COVID-19 negative.   Chest radiograph with hyperinflation but no infiltrates.    EKG 87 bpm, normal axis, normal intervals, sinus rhythm, no significant ST segment or T wave changes.    Patient was placed on systemic and inhaled corticosteroids along with aggressive bronchodilator    Assessment & Plan:   Principal Problem:   Asthma exacerbation Active Problems:   Essential hypertension   G E R D   Diabetes mellitus type 2, controlled (HCC)   CAD in native artery   Chronic diastolic CHF (congestive heart failure) (HCC)   CKD (chronic kidney disease), stage III (HCC)  #1 acute asthma exacerbation -Likely triggered by recent metapneumovirus. -Patient with ongoing dyspnea and wheezing, upper airway noise which is slowly improving. -Hypoxia improved. -Check ambulatory  sats. -Decrease IV Solu-Medrol to daily and transition to oral prednisone taper tomorrow. -Continue Singulair, PPI, Pulmicort, scheduled duo nebs, Cepacol lozenges 3 times daily. -We will need outpatient follow-up with primary pulmonologist versus PCP. -Supportive care.  2.  Chronic diastolic CHF/hypertension -Continue diltiazem, nebivolol for blood pressure control. -Increase hydralazine to 100 mg 3 times daily. -No signs of volume overload on examination. -Outpatient follow-up.  3.  Type 2 diabetes mellitus with steroid-induced hyperglycemia/dyslipidemia -Hemoglobin A1c 6.8 (04/05/2021) -CBGs elevated likely steroid-induced. -CBG noted as high as 475 yesterday afternoon around 5 PM. -Semglee increased to 20 units daily. -CBG 255 this morning. -Increase meal coverage NovoLog to 10 units 3 times daily with meals. -Monitor CBGs with steroid taper. -Continue statin.  4.  CKD stage IIIa/hypomagnesemia -Magnesium repleted and stable at 2.0. -Creatinine trending down currently at 1.16. -Follow.  5.  Anemia of chronic disease -Hemoglobin stable at 9.8.  6.  Depression/anxiety -Stable on home regimen of Depakote, alprazolam.     DVT prophylaxis: Lovenox Code Status: Full Family Communication: Updated patient.  Updated son at bedside. Disposition:   Status is: Inpatient  Remains inpatient appropriate because: Severity of illness       Consultants:  None  Procedures:  Chest x-ray 04/22/2021  Antimicrobials:  None   Subjective: Patient sitting up in chair.  Overall feeling better.  States wheezing is improving.  States upper airway noise improving.  Tolerating current diet.  Son at bedside.   Objective: Vitals:   04/25/21 1944 04/25/21 2138 04/26/21 0500 04/26/21 0527  BP:  (!) 165/73  Marland Kitchen)  191/87  Pulse:  88  80  Resp:  18  18  Temp:  97.9 F (36.6 C)  98.5 F (36.9 C)  TempSrc:  Oral  Oral  SpO2: 99% 100%  100%  Weight:   75.9 kg   Height:         Intake/Output Summary (Last 24 hours) at 04/26/2021 1313 Last data filed at 04/26/2021 1000 Gross per 24 hour  Intake 1320 ml  Output 2525 ml  Net -1205 ml    Filed Weights   04/24/21 0500 04/25/21 0500 04/26/21 0500  Weight: 76.6 kg 76.8 kg 75.9 kg    Examination:  General exam: Appears calm and comfortable  Respiratory system: Decreased upper airway noise.  Minimal expiratory wheezing.  Speaking in full sentences.  Fair air movement  Cardiovascular system: Regular rate rhythm no murmurs rubs or gallops.  No JVD.  No lower extremity edema.   Gastrointestinal system: Abdomen is soft, nontender, nondistended, positive bowel sounds.  No rebound.  No guarding.   Central nervous system: Alert and oriented. No focal neurological deficits. Extremities: Symmetric 5 x 5 power. Skin: No rashes, lesions or ulcers Psychiatry: Judgement and insight appear normal. Mood & affect appropriate.     Data Reviewed: I have personally reviewed following labs and imaging studies  CBC: Recent Labs  Lab 04/22/21 1208 04/23/21 0440 04/26/21 0415  WBC 13.6* 9.9 10.4  NEUTROABS 9.0* 8.8* 8.9*  HGB 10.4* 9.5* 9.8*  HCT 32.0* 28.9* 29.7*  MCV 94.1 95.1 94.3  PLT 175 158 177     Basic Metabolic Panel: Recent Labs  Lab 04/23/21 0440 04/24/21 0508 04/25/21 0421 04/25/21 1733 04/26/21 0415  NA 137 137 137 132* 137  K 4.7 4.3 4.8 4.6 4.7  CL 99 100 103 99 102  CO2 25 27 26 23 26   GLUCOSE 340* 270* 324* 498* 292*  BUN 36* 46* 40* 36* 30*  CREATININE 1.51* 1.65* 1.47* 1.32* 1.16*  CALCIUM 8.1* 8.1* 8.0* 7.9* 8.2*  MG 1.2* 2.0  --   --   --      GFR: Estimated Creatinine Clearance: 41.8 mL/min (A) (by C-G formula based on SCr of 1.16 mg/dL (H)).  Liver Function Tests: Recent Labs  Lab 04/23/21 0440  AST 20  ALT 22  ALKPHOS 48  BILITOT 0.5  PROT 6.9  ALBUMIN 3.7     CBG: Recent Labs  Lab 04/25/21 1634 04/25/21 2113 04/26/21 0032 04/26/21 0751 04/26/21 1205   GLUCAP 475* 332* 265* 255* 315*      Recent Results (from the past 240 hour(s))  Resp Panel by RT-PCR (Flu A&B, Covid) Nasopharyngeal Swab     Status: None   Collection Time: 04/22/21  2:51 PM   Specimen: Nasopharyngeal Swab; Nasopharyngeal(NP) swabs in vial transport medium  Result Value Ref Range Status   SARS Coronavirus 2 by RT PCR NEGATIVE NEGATIVE Final    Comment: (NOTE) SARS-CoV-2 target nucleic acids are NOT DETECTED.  The SARS-CoV-2 RNA is generally detectable in upper respiratory specimens during the acute phase of infection. The lowest concentration of SARS-CoV-2 viral copies this assay can detect is 138 copies/mL. A negative result does not preclude SARS-Cov-2 infection and should not be used as the sole basis for treatment or other patient management decisions. A negative result may occur with  improper specimen collection/handling, submission of specimen other than nasopharyngeal swab, presence of viral mutation(s) within the areas targeted by this assay, and inadequate number of viral copies(<138 copies/mL). A negative result must  be combined with clinical observations, patient history, and epidemiological information. The expected result is Negative.  Fact Sheet for Patients:  EntrepreneurPulse.com.au  Fact Sheet for Healthcare Providers:  IncredibleEmployment.be  This test is no t yet approved or cleared by the [place] FDA and  has been authorized for detection and/or diagnosis of SARS-CoV-2 by FDA under an Emergency Use Authorization (EUA). This EUA will remain  in effect (meaning this test can be used) for the duration of the COVID-19 declaration under Section 564(b)(1) of the Act, 21 U.S.C.section 360bbb-3(b)(1), unless the authorization is terminated  or revoked sooner.       Influenza A by PCR NEGATIVE NEGATIVE Final   Influenza B by PCR NEGATIVE NEGATIVE Final    Comment: (NOTE) The Xpert Xpress  SARS-CoV-2/FLU/RSV plus assay is intended as an aid in the diagnosis of influenza from Nasopharyngeal swab specimens and should not be used as a sole basis for treatment. Nasal washings and aspirates are unacceptable for Xpert Xpress SARS-CoV-2/FLU/RSV testing.  Fact Sheet for Patients: EntrepreneurPulse.com.au  Fact Sheet for Healthcare Providers: IncredibleEmployment.be  This test is not yet approved or cleared by the [place] FDA and has been authorized for detection and/or diagnosis of SARS-CoV-2 by FDA under an Emergency Use Authorization (EUA). This EUA will remain in effect (meaning this test can be used) for the duration of the COVID-19 declaration under Section 564(b)(1) of the Act, 21 U.S.C. section 360bbb-3(b)(1), unless the authorization is terminated or revoked.  Performed at [facility], [address], [place], [place] [address]           Radiology Studies: No results found.      Scheduled Meds:  atorvastatin  40 mg Oral QHS   budesonide (PULMICORT) nebulizer solution  0.5 mg Nebulization BID   diltiazem  240 mg Oral Daily   divalproex  500 mg Oral QHS   enoxaparin (LOVENOX) injection  40 mg Subcutaneous Q24H   hydrALAZINE  75 mg Oral TID   insulin aspart  0-15 Units Subcutaneous TID WC   insulin aspart  10 Units Subcutaneous TID WC   insulin glargine-yfgn  20 Units Subcutaneous Daily   ipratropium-albuterol  3 mL Nebulization TID   memantine  10 mg Oral BID   menthol-cetylpyridinium  1 lozenge Oral TID   methylPREDNISolone (SOLU-MEDROL) injection  40 mg Intravenous Q12H   montelukast  10 mg Oral QHS   nebivolol  10 mg Oral Daily   pantoprazole  40 mg Oral Daily   phenol  1 spray Mouth/Throat TID   Continuous Infusions:   LOS: 4 days    Time spent: 40 minutes    Irine Seal, MD [facility]   To contact the attending provider between 7A-7P or the covering provider  during after hours 7P-7A, please log into the web site [email or web address] and access using universal [place] password for that web site. If you do not have the password, please call the hospital operator.  04/26/2021, 1:13 PM

## 2021-04-27 ENCOUNTER — Ambulatory Visit: Payer: Medicare Other | Admitting: Physical Therapy

## 2021-04-27 ENCOUNTER — Ambulatory Visit: Payer: Medicare Other | Admitting: Cardiology

## 2021-04-27 DIAGNOSIS — J4551 Severe persistent asthma with (acute) exacerbation: Secondary | ICD-10-CM | POA: Diagnosis not present

## 2021-04-27 LAB — GLUCOSE, CAPILLARY
Glucose-Capillary: 185 mg/dL — ABNORMAL HIGH (ref 70–99)
Glucose-Capillary: 248 mg/dL — ABNORMAL HIGH (ref 70–99)
Glucose-Capillary: 381 mg/dL — ABNORMAL HIGH (ref 70–99)
Glucose-Capillary: 422 mg/dL — ABNORMAL HIGH (ref 70–99)
Glucose-Capillary: 286 mg/dL — ABNORMAL HIGH (ref 70–99)

## 2021-04-27 MED ORDER — ARFORMOTEROL TARTRATE 15 MCG/2ML IN NEBU
15.0000 ug | INHALATION_SOLUTION | Freq: Two times a day (BID) | RESPIRATORY_TRACT | Status: DC
  Administered 2021-04-27 (×2): 15 ug via RESPIRATORY_TRACT
  Filled 2021-04-27 (×5): qty 2

## 2021-04-27 MED ORDER — HYDRALAZINE HCL 20 MG/ML IJ SOLN
10.0000 mg | Freq: Once | INTRAMUSCULAR | Status: AC | PRN
  Administered 2021-04-27: 07:00:00 10 mg via INTRAVENOUS
  Filled 2021-04-27: qty 1

## 2021-04-27 MED ORDER — INSULIN ASPART 100 UNIT/ML IJ SOLN
20.0000 [IU] | Freq: Once | INTRAMUSCULAR | Status: AC
  Administered 2021-04-27: 17:00:00 20 [IU] via SUBCUTANEOUS

## 2021-04-27 MED ORDER — PREDNISONE 20 MG PO TABS
40.0000 mg | ORAL_TABLET | Freq: Every day | ORAL | Status: DC
  Administered 2021-04-28: 08:00:00 40 mg via ORAL
  Filled 2021-04-27: qty 2

## 2021-04-27 NOTE — Progress Notes (Signed)
°  Progress Note   Patient: [name] XBJ:[id number] DOB: April 01, 1951 DOA: 04/22/2021     5 DOS: the patient was seen and examined on 04/27/2021   Brief hospital course: No notes on file  Assessment and Plan * Asthma exacerbation- (present on admission) Continues to have wheezing although appears to be mostly upper airway. Will likely benefit from outpatient ENT follow-up. Currently on prednisone.  Nebulizer therapy. Hypoxia improving.  Chronic kidney disease, stage 3a ([place])- (present on admission) Stable.  Chronic diastolic CHF (congestive heart failure) ([place])- (present on admission) Stable.  Patient euvolemic.  CAD in native artery- (present on admission) Continue Lipitor and Bystolic.  Diabetes mellitus type 2, controlled ([place]) Add sliding scale insulin.  Uncontrolled with hyperglycemia.  Rapidly tapering steroids.  Monitor.  G E R D- (present on admission) Continue PPI.  Essential hypertension- (present on admission) Chronic.  Continue Cardizem, hydralazine, Bystolic.     Subjective: Continues to have shortness of breath but no nausea or vomiting.  No fever no chills.  Minimal oral intake with  Objective Vital signs were reviewed and unremarkable. General: Appear in mild distress, no Rash; Oral Mucosa clear. no Abnormal Neck Mass Or lumps, Conjunctiva normal  Cardiovascular: S1 and S2 Present, no Murmur Respiratory: increased respiratory effort, Bilateral Air entry present and no Crackles, bilateral upper airway expiratory wheezes Abdomen: Bowel Sound present, Soft and no tenderness Extremities: trace Pedal edema Neurology: alert and oriented to time, place, and person affect appropriate. no new focal deficit Gait not checked due to patient safety concerns     Data Reviewed: My review of labs, imaging, notes and other tests shows no new significant findings.   Family Communication: None at bedside  Disposition: Status is: Inpatient  Remains inpatient  appropriate because: Ongoing respiratory distress and has maceration requiring nebulizer therapy.         Time spent: 35 minutes  Author: Berle Mull 04/27/2021 9:03 PM  For on call review [email or web address].

## 2021-04-27 NOTE — Care Management Important Message (Signed)
Important Message  Patient Details IM Letter given to the Patient. Name: [name] MRN: [id number] Date of Birth: Feb 09, 1951   Medicare Important Message Given:  Yes     Kerin Salen 04/27/2021, 12:21 PM

## 2021-04-27 NOTE — Progress Notes (Signed)
Dr. Posey Pronto aware of pt's CBG 422. He said not to obtain stat lab. See new order entered into Epic.

## 2021-04-28 ENCOUNTER — Inpatient Hospital Stay (HOSPITAL_COMMUNITY): Payer: Medicare Other

## 2021-04-28 DIAGNOSIS — R609 Edema, unspecified: Secondary | ICD-10-CM

## 2021-04-28 DIAGNOSIS — M79604 Pain in right leg: Secondary | ICD-10-CM

## 2021-04-28 DIAGNOSIS — M79605 Pain in left leg: Secondary | ICD-10-CM

## 2021-04-28 LAB — GLUCOSE, CAPILLARY
Glucose-Capillary: 201 mg/dL — ABNORMAL HIGH (ref 70–99)
Glucose-Capillary: 213 mg/dL — ABNORMAL HIGH (ref 70–99)

## 2021-04-28 MED ORDER — MELATONIN 10 MG PO TABS: 10.0000 mg | ORAL_TABLET | Freq: Every evening | ORAL | 0 refills | Status: DC | PRN

## 2021-04-28 MED ORDER — FUROSEMIDE 40 MG PO TABS
40.0000 mg | ORAL_TABLET | Freq: Once | ORAL | Status: AC
  Administered 2021-04-28: 12:00:00 40 mg via ORAL
  Filled 2021-04-28: qty 1

## 2021-04-28 MED ORDER — PANTOPRAZOLE SODIUM 40 MG PO TBEC: 40.0000 mg | DELAYED_RELEASE_TABLET | Freq: Two times a day (BID) | ORAL | Status: DC

## 2021-04-28 MED ORDER — PREDNISONE 10 MG PO TABS: ORAL_TABLET | ORAL | 0 refills | Status: DC

## 2021-04-28 NOTE — Progress Notes (Signed)
Assessment unchanged. Pt verbalized understanding of dc instructions through teach back including medications and follow up care. Discharged via wc to front entrance accompanied by NT and family member.

## 2021-04-28 NOTE — Progress Notes (Signed)
°   04/23/21 0132  Assess: MEWS Score  Temp 98 F (36.7 C)  BP (!) 148/72  Pulse Rate (!) 112  Resp 18  SpO2 95 %  O2 Device Room Air  Assess: MEWS Score  MEWS Temp 0  MEWS Systolic 0  MEWS Pulse 2  MEWS RR 0  MEWS LOC 0  MEWS Score 2  MEWS Score Color Yellow  Assess: if the MEWS score is Yellow or Red  Were vital signs taken at a resting state? Yes  Focused Assessment No change from prior assessment  Does the patient meet 2 or more of the SIRS criteria? No  MEWS guidelines implemented *See Row Information* Yes  Treat  MEWS Interventions Administered prn meds/treatments  Take Vital Signs  Increase Vital Sign Frequency  Yellow: Q 2hr X 2 then Q 4hr X 2, if remains yellow, continue Q 4hrs  Notify: Charge Nurse/RN  Name of Charge Nurse/RN Notified Adriana RN  Date Charge Nurse/RN Notified 04/23/21  Time Charge Nurse/RN Notified 0132  Notify: Provider  Provider Name/Title previous chift RN notified to MD  Document  Patient Outcome Stabilized after interventions  Progress note created (see row info) Yes  Assess: SIRS CRITERIA  SIRS Temperature  0  SIRS Pulse 1  SIRS Respirations  0  SIRS WBC 0  SIRS Score Sum  1

## 2021-04-28 NOTE — Progress Notes (Signed)
Physical Therapy Treatment Patient Details Name: [name] MRN: [id number] DOB: 1951-03-13 Today's Date: 04/28/2021   History of Present Illness Patient is a 70 year old female who presented 04/23/21 for asthma flare.   PMH significant of asthma, CAD, CKD 3, CHF, depression, anxiety, diabetes, hypertension, GERD, hyperlipidemia, macrocytic anemia, pulmonary embolism, seizure    PT Comments    Pt in good spirits and up to ambulate in hall.  Pt with decreased pace but ambulating IND in hall with noted improvement in balance, minimal SOB and requiring no rest breaks to complete task.  PT  will sign off and pass pt on to mobility team.  Pt hopeful for dc home soon.  Recommendations for follow up therapy are one component of a multi-disciplinary discharge planning process, led by the attending physician.  Recommendations may be updated based on patient status, additional functional criteria and insurance authorization.  Follow Up Recommendations  No PT follow up     Assistance Recommended at Discharge None  Equipment Recommendations  None recommended by PT    Recommendations for Other Services       Precautions / Restrictions Precautions Precautions: Fall Restrictions Weight Bearing Restrictions: No     Mobility  Bed Mobility Overal bed mobility: Independent             General bed mobility comments: in recliner    Transfers Overall transfer level: Modified independent                 General transfer comment: No physical assist    Ambulation/Gait Ambulation/Gait assistance: Independent Gait Distance (Feet): 220 Feet Assistive device: None Gait Pattern/deviations: Step-through pattern;Decreased stride length;Wide base of support Gait velocity: decr     General Gait Details: decreased pace but no rest breaks and minimal SOB.   Stairs             Wheelchair Mobility    Modified Rankin (Stroke Patients Only)       Balance Overall balance  assessment: Mild deficits observed, not formally tested                                          Cognition Arousal/Alertness: Awake/alert Behavior During Therapy: WFL for tasks assessed/performed Overall Cognitive Status: Within Functional Limits for tasks assessed                                          Exercises      General Comments        Pertinent Vitals/Pain Pain Assessment: No/denies pain    Home Living                          Prior Function            PT Goals (current goals can now be found in the care plan section) Acute Rehab PT Goals Patient Stated Goal: return home with breathing better PT Goal Formulation: With patient Time For Goal Achievement: 05/08/21 Potential to Achieve Goals: Good Progress towards PT goals: Goals met/education completed, patient discharged from PT    Frequency    Min 3X/week      PT Plan Current plan remains appropriate    Co-evaluation  AM-PAC PT "6 Clicks" Mobility   Outcome Measure  Help needed turning from your back to your side while in a flat bed without using bedrails?: None Help needed moving from lying on your back to sitting on the side of a flat bed without using bedrails?: None Help needed moving to and from a bed to a chair (including a wheelchair)?: None Help needed standing up from a chair using your arms (e.g., wheelchair or bedside chair)?: None Help needed to walk in hospital room?: None Help needed climbing 3-5 steps with a railing? : A Little 6 Click Score: 23    End of Session Equipment Utilized During Treatment: Gait belt Activity Tolerance: Patient tolerated treatment well Patient left: in chair;with call bell/phone within reach Nurse Communication: Mobility status PT Visit Diagnosis: Difficulty in walking, not elsewhere classified (R26.2)     Time: 9702-6378 PT Time Calculation (min) (ACUTE ONLY): 12 min  Charges:  $Gait  Training: 8-22 mins                     Kennard Pager [phone] Office [phone]    Jahdai Padovano 04/28/2021, 10:55 AM

## 2021-04-28 NOTE — Progress Notes (Signed)
Bilateral lower extremity venous duplex completed. Refer to "CV Proc" under chart review to view preliminary results.  04/28/2021 1:54 PM Kelby Aline., MHA, RVT, RDCS, RDMS

## 2021-05-01 NOTE — Discharge Summary (Signed)
Physician Discharge Summary   Patient: [name] MRN: [id number] DOB: @DOB   Admit date:     04/22/2021  Discharge date: 04/28/2021  Discharge Physician: Berle Mull   PCP: [name], MD   Recommendations at discharge: Follow up with PCP in 1 week May need ENT referral for chronic wheezing.   Discharge Diagnoses Principal Problem:   Asthma exacerbation Active Problems:   Chronic diastolic CHF (congestive heart failure) (HCC)   Chronic kidney disease, stage 3a (HCC)   CAD in native artery   Diabetes mellitus type 2, controlled ([place])   Essential hypertension   G E R D   Depression with anxiety   Hyperlipidemia   Vocal cord dysfunction  Resolved Problems:   * No resolved hospital problems. [facility] Course   No notes on file  * Asthma exacerbation- (present on admission) Continues to have wheezing although appears to be mostly upper airway. Will likely benefit from outpatient ENT follow-up. Currently on prednisone.  Nebulizer therapy. Hypoxia improving.  Chronic diastolic CHF (congestive heart failure) ([place])- (present on admission) Stable.  Patient euvolemic.  Chronic kidney disease, stage 3a ([place])- (present on admission) Stable.  CAD in native artery- (present on admission) Continue Lipitor and Bystolic.  Diabetes mellitus type 2, controlled ([place]) Add sliding scale insulin.  Uncontrolled with hyperglycemia.  Rapidly tapering steroids.  Monitor.  Essential hypertension- (present on admission) Chronic.  Continue Cardizem, hydralazine, Bystolic.  G E R D- (present on admission) Continue PPI.      Consultants: none Procedures performed: none  Disposition: Home Diet recommendation: Cardiac diet  DISCHARGE MEDICATION: Allergies as of 04/28/2021       Reactions   Chlorthalidone Other (See Comments)   Was stopped due to kidney function   Tramadol Other (See Comments)   Not to be taken because of existing seizure disorder         Medication List     STOP taking these medications    potassium chloride SA 20 MEQ tablet Commonly known as: KLOR-CON M       TAKE these medications    albuterol 108 (90 Base) MCG/ACT inhaler Commonly known as: ProAir HFA Inhale 2 puffs into the lungs every 4 (four) hours as needed for wheezing or shortness of breath.   ALPRAZolam 0.5 MG tablet Commonly known as: XANAX Take 1 tablet (0.5 mg total) by mouth 2 (two) times daily as needed for anxiety. What changed:  when to take this additional instructions   atorvastatin 40 MG tablet Commonly known as: LIPITOR Take 40 mg by mouth at bedtime.   Biotin 5000 MCG Caps Take 5,000 mcg by mouth daily.   budesonide 0.5 MG/2ML nebulizer solution Commonly known as: PULMICORT USE 1 VIAL IN NEBULIZER IN THE MORNING AND AT BEDTIME What changed: See the new instructions.   chlorpheniramine-HYDROcodone 10-8 MG/5ML Suer Commonly known as: TUSSIONEX Take 5 mLs by mouth every 12 (twelve) hours as needed for cough.   diltiazem 240 MG 24 hr capsule Commonly known as: CARDIZEM CD Take 1 capsule (240 mg total) by mouth daily.   divalproex 500 MG 24 hr tablet Commonly known as: Depakote ER Take 1 tablet (500 mg total) by mouth at bedtime.   EQ Probiotic Caps Take 1 capsule by mouth every morning.   esomeprazole 40 MG capsule Commonly known as: NEXIUM Take 40 mg by mouth 2 (two) times daily before a meal.   fluticasone 50 MCG/ACT nasal spray Commonly known as: FLONASE Place 2 sprays into  both nostrils daily as needed for allergies.   formoterol 20 MCG/2ML nebulizer solution Commonly known as: Perforomist USE 1 VIAL  IN  NEBULIZER TWICE  DAILY - morning and evening What changed:  how much to take when to take this additional instructions   hydrALAZINE 100 MG tablet Commonly known as: APRESOLINE Take 1 tablet (100 mg total) by mouth 3 (three) times daily. What changed: Another medication with the same name was removed.  Continue taking this medication, and follow the directions you see here.   ipratropium-albuterol 0.5-2.5 (3) MG/3ML Soln Commonly known as: DUONEB Take 3 mLs by nebulization every 8 (eight) hours as needed (SOB).   latanoprost 0.005 % ophthalmic solution Commonly known as: XALATAN Place 1 drop into both eyes at bedtime.   loratadine 10 MG tablet Commonly known as: CLARITIN Take 1 tablet (10 mg total) by mouth daily. What changed:  when to take this reasons to take this   Melatonin 10 MG Tabs Take 10 mg by mouth at bedtime as needed.   memantine 10 MG tablet Commonly known as: NAMENDA Take 1 tablet (10 mg total) by mouth 2 (two) times daily. What changed: when to take this   METAMUCIL PO Take by mouth See admin instructions. Mix powder as directed and drink once a day as directed for constipation   metFORMIN 500 MG tablet Commonly known as: GLUCOPHAGE Take 500 mg by mouth 2 (two) times daily with a meal.   montelukast 10 MG tablet Commonly known as: SINGULAIR Take 1 tablet (10 mg total) by mouth at bedtime.   multivitamin with minerals Tabs tablet Take 1 tablet by mouth every morning.   nebivolol 10 MG tablet Commonly known as: BYSTOLIC Take 1 tablet (10 mg total) by mouth daily.   nitroGLYCERIN 0.4 MG SL tablet Commonly known as: NITROSTAT DISSOLVE ONE TABLET UNDER THE TONGUE EVERY 5 MINUTES AS NEEDED FOR CHEST PAIN.  DO NOT EXCEED A TOTAL OF 3 DOSES IN 15 MINUTES What changed: See the new instructions.   predniSONE 10 MG tablet Commonly known as: DELTASONE Take 40mg  daily for 3days,Take 30mg  daily for 3days,Take 20mg  daily for 3days,Take 10mg  daily for 3days, then stop What changed:  additional instructions Another medication with the same name was removed. Continue taking this medication, and follow the directions you see here.        Follow-up Information     [name], MD. Schedule an appointment as soon as possible for a visit in 1 week(s).    Specialty: Family Medicine Contact information: [place] [address] [phone]                 Discharge Exam: Danley Danker Weights   04/26/21 0500 04/27/21 0500 04/28/21 0500  Weight: 75.9 kg 78.6 kg 78.6 kg   General: Appear in mild distress, no Rash; Oral Mucosa clear. no Abnormal Neck Mass Or lumps, Conjunctiva normal  Cardiovascular: S1 and S2 Present, no Murmur Respiratory: increased respiratory effort, Bilateral Air entry present and no Crackles, bilateral expiratory wheezes Abdomen: Bowel Sound present, Soft and no tenderness Extremities: trace Pedal edema Neurology: alert and oriented to time, place, and person affect appropriate. no new focal deficit Gait not checked due to patient safety concerns     Condition at discharge: good  The results of significant diagnostics from this hospitalization (including imaging, microbiology, ancillary and laboratory) are listed below for reference.   Imaging Studies: DG Chest Portable 1 View  Result Date: 04/22/2021 CLINICAL  DATA:  Shortness of breath EXAM: PORTABLE CHEST 1 VIEW COMPARISON:  Chest radiograph dated April 08, 2021 FINDINGS: The heart is normal in size. Low lung volumes with bibasilar atelectasis. No focal consolidation or pleural effusion. The osseous structures are unremarkable. IMPRESSION: Low lung volumes without evidence of focal consolidation or pleural effusion. Bibasilar atelectasis. Electronically Signed   By: Keane Police D.O.   On: 04/22/2021 11:35   DG Chest Port 1V same Day  Result Date: 04/08/2021 CLINICAL DATA:  Shortness of breath EXAM: PORTABLE CHEST 1 VIEW COMPARISON:  Previous studies including the examination of 04/01/2021 FINDINGS: Transverse diameter of heart is increased. Thoracic aorta is tortuous and ectatic. There are no signs of alveolar pulmonary edema or focal pulmonary consolidation. There are small linear densities in the lower lung fields, more so on the  left side. There is no pleural effusion or pneumothorax. IMPRESSION: There are no signs of pulmonary edema or focal pulmonary consolidation. Small linear densities seen in the lower lung fields, more so on the left side suggesting subsegmental atelectasis. Electronically Signed   By: Elmer Picker M.D.   On: 04/08/2021 09:12   VAS [place] LOWER EXTREMITY VENOUS (DVT)  Result Date: 04/28/2021  Lower Venous DVT Study Patient Name:  SURA CANUL  Date of Exam:   04/28/2021 Medical Rec #: [id number]       Accession #:    [id number] Date of Birth: 11-Dec-1950        Patient Gender: F Patient Age:   49 years Exam Location:  [facility] Procedure:      VAS [place] LOWER EXTREMITY VENOUS (DVT) Referring Phys: Kyndal Heringer --------------------------------------------------------------------------------  Indications: Edema, and Pain.  Limitations: Body habitus and poor ultrasound/tissue interface. Comparison Study: 10/07/2017 negative lower extremity venous duplex Performing Technologist: Maudry Mayhew MHA, RDMS, RVT, RDCS  Examination Guidelines: A complete evaluation includes B-mode imaging, spectral Doppler, color Doppler, and power Doppler as needed of all accessible portions of each vessel. Bilateral testing is considered an integral part of a complete examination. Limited examinations for reoccurring indications may be performed as noted. The reflux portion of the exam is performed with the patient in reverse Trendelenburg.  +---------+---------------+---------+-----------+----------+--------------+  RIGHT     Compressibility Phasicity Spontaneity Properties Thrombus Aging  +---------+---------------+---------+-----------+----------+--------------+  CFV       Full            Yes       Yes                                    +---------+---------------+---------+-----------+----------+--------------+  SFJ       Full                                                              +---------+---------------+---------+-----------+----------+--------------+  FV Prox   Full                                                             +---------+---------------+---------+-----------+----------+--------------+  FV Mid    Full                                                             +---------+---------------+---------+-----------+----------+--------------+  FV Distal Full                                                             +---------+---------------+---------+-----------+----------+--------------+  PFV       Full                                                             +---------+---------------+---------+-----------+----------+--------------+  POP       Full            Yes       Yes                                    +---------+---------------+---------+-----------+----------+--------------+  PTV       Full                                                             +---------+---------------+---------+-----------+----------+--------------+  PERO      Full                                                             +---------+---------------+---------+-----------+----------+--------------+   +---------+---------------+---------+-----------+----------+--------------+  LEFT      Compressibility Phasicity Spontaneity Properties Thrombus Aging  +---------+---------------+---------+-----------+----------+--------------+  CFV       Full            Yes       Yes                                    +---------+---------------+---------+-----------+----------+--------------+  SFJ       Full                                                             +---------+---------------+---------+-----------+----------+--------------+  FV Prox   Full                                                             +---------+---------------+---------+-----------+----------+--------------+  FV Mid    Full                                                              +---------+---------------+---------+-----------+----------+--------------+  FV Distal Full                                                             +---------+---------------+---------+-----------+----------+--------------+  PFV       Full                                                             +---------+---------------+---------+-----------+----------+--------------+  POP       Full            Yes       Yes                                    +---------+---------------+---------+-----------+----------+--------------+  PTV       Full                                                             +---------+---------------+---------+-----------+----------+--------------+  PERO      Full                                                             +---------+---------------+---------+-----------+----------+--------------+     Summary: RIGHT: - There is no evidence of deep vein thrombosis in the lower extremity.  - No cystic structure found in the popliteal fossa.  LEFT: - There is no evidence of deep vein thrombosis in the lower extremity.  - No cystic structure found in the popliteal fossa.  *See table(s) above for measurements and observations. Electronically signed by Harold Barban MD on 04/28/2021 at 8:01:31 PM.    Final     Microbiology: Results for orders placed or performed during the hospital encounter of 04/22/21  Resp Panel by RT-PCR (Flu A&B, Covid) Nasopharyngeal Swab     Status: None   Collection Time: 04/22/21  2:51 PM   Specimen: Nasopharyngeal Swab; Nasopharyngeal(NP) swabs in vial transport medium  Result Value Ref Range Status   SARS Coronavirus 2 by RT PCR NEGATIVE NEGATIVE Final    Comment: (NOTE) SARS-CoV-2 target nucleic acids are NOT DETECTED.  The SARS-CoV-2 RNA is generally detectable in upper respiratory specimens during the acute phase of infection. The lowest concentration of SARS-CoV-2 viral copies this assay can detect is 138 copies/mL. A negative result does not  preclude SARS-Cov-2 infection and should not be used as the sole basis for treatment or other patient management decisions. A negative result may occur with  improper specimen collection/handling, submission of specimen other than nasopharyngeal swab, presence of viral mutation(s) within the areas targeted by this assay, and inadequate number of viral copies(<138 copies/mL). A negative result must be combined with clinical observations, patient history, and epidemiological information.  The expected result is Negative.  Fact Sheet for Patients:  EntrepreneurPulse.com.au  Fact Sheet for Healthcare Providers:  IncredibleEmployment.be  This test is no t yet approved or cleared by the [place] FDA and  has been authorized for detection and/or diagnosis of SARS-CoV-2 by FDA under an Emergency Use Authorization (EUA). This EUA will remain  in effect (meaning this test can be used) for the duration of the COVID-19 declaration under Section 564(b)(1) of the Act, 21 U.S.C.section 360bbb-3(b)(1), unless the authorization is terminated  or revoked sooner.       Influenza A by PCR NEGATIVE NEGATIVE Final   Influenza B by PCR NEGATIVE NEGATIVE Final    Comment: (NOTE) The Xpert Xpress SARS-CoV-2/FLU/RSV plus assay is intended as an aid in the diagnosis of influenza from Nasopharyngeal swab specimens and should not be used as a sole basis for treatment. Nasal washings and aspirates are unacceptable for Xpert Xpress SARS-CoV-2/FLU/RSV testing.  Fact Sheet for Patients: EntrepreneurPulse.com.au  Fact Sheet for Healthcare Providers: IncredibleEmployment.be  This test is not yet approved or cleared by the [place] FDA and has been authorized for detection and/or diagnosis of SARS-CoV-2 by FDA under an Emergency Use Authorization (EUA). This EUA will remain in effect (meaning this test can be used) for the  duration of the COVID-19 declaration under Section 564(b)(1) of the Act, 21 U.S.C. section 360bbb-3(b)(1), unless the authorization is terminated or revoked.  Performed at [facility], [address] [place], [place] [address]     Labs: CBC: Recent Labs  Lab 04/26/21 0415  WBC 10.4  NEUTROABS 8.9*  HGB 9.8*  HCT 29.7*  MCV 94.3  PLT 412   Basic Metabolic Panel: Recent Labs  Lab 04/25/21 0421 04/25/21 1733 04/26/21 0415  NA 137 132* 137  K 4.8 4.6 4.7  CL 103 99 102  CO2 26 23 26   GLUCOSE 324* 498* 292*  BUN 40* 36* 30*  CREATININE 1.47* 1.32* 1.16*  CALCIUM 8.0* 7.9* 8.2*   Liver Function Tests: No results for input(s): AST, ALT, ALKPHOS, BILITOT, PROT, ALBUMIN in the last 168 hours. CBG: Recent Labs  Lab 04/27/21 1556 04/27/21 1656 04/27/21 2107 04/28/21 0743 04/28/21 1123  GLUCAP 381* 422* 286* 201* 213*    Discharge time spent: greater than 30 minutes.  Signed:  Berle Mull MD.  [facility]

## 2021-05-03 ENCOUNTER — Ambulatory Visit: Payer: Medicare Other | Attending: Gastroenterology | Admitting: Physical Therapy

## 2021-05-03 ENCOUNTER — Telehealth: Payer: Self-pay | Admitting: Physical Therapy

## 2021-05-03 ENCOUNTER — Other Ambulatory Visit: Payer: Self-pay | Admitting: Cardiology

## 2021-05-03 ENCOUNTER — Ambulatory Visit
Admission: RE | Admit: 2021-05-03 | Discharge: 2021-05-03 | Disposition: A | Discharge status: Home / Self Care | Payer: Medicare Other | Source: Ambulatory Visit | Attending: Physician Assistant | Admitting: Physician Assistant

## 2021-05-03 ENCOUNTER — Other Ambulatory Visit: Payer: Self-pay | Admitting: Physician Assistant

## 2021-05-03 DIAGNOSIS — R293 Abnormal posture: Secondary | ICD-10-CM | POA: Insufficient documentation

## 2021-05-03 DIAGNOSIS — J4541 Moderate persistent asthma with (acute) exacerbation: Secondary | ICD-10-CM

## 2021-05-03 DIAGNOSIS — M6281 Muscle weakness (generalized): Secondary | ICD-10-CM | POA: Insufficient documentation

## 2021-05-03 NOTE — Telephone Encounter (Signed)
Pt was admitted to get a chest x-ray due to significant wheezing and coughing. Pt did not have time to cancel appointment due to this happening this morning and she is still at the doctor visit.  She was reminded of no show policy  [employer], PT 05/03/21 12:11 PM

## 2021-05-10 ENCOUNTER — Other Ambulatory Visit: Payer: Self-pay

## 2021-05-10 ENCOUNTER — Encounter: Payer: Self-pay | Admitting: Physical Therapy

## 2021-05-10 ENCOUNTER — Ambulatory Visit: Payer: Medicare Other | Admitting: Physical Therapy

## 2021-05-10 DIAGNOSIS — M6281 Muscle weakness (generalized): Secondary | ICD-10-CM | POA: Diagnosis not present

## 2021-05-10 DIAGNOSIS — R293 Abnormal posture: Secondary | ICD-10-CM | POA: Diagnosis present

## 2021-05-10 NOTE — Therapy (Signed)
[facility] @ [place], [place], [address] Phone: [phone]   Fax:  [phone]  Physical Therapy Treatment  Patient Details  Name: [name] MRN: [id number] Date of Birth: [birth year] Referring Provider (PT): Garnette Scheuermann, [place]   Encounter Date: 05/10/2021   PT End of Session - 05/10/21 1153     Visit Number 2    Date for PT Re-Evaluation 06/22/21    Authorization Type UHC    PT Start Time 1148    PT Stop Time 1226    PT Time Calculation (min) 38 min    Activity Tolerance Patient tolerated treatment well    Behavior During Therapy [facility] for tasks assessed/performed             Past Medical History:  Diagnosis Date   Anxiety    Arthritis    Asthma    Chronic diastolic CHF (congestive heart failure) ([place]) 07/17/2016   Coronary artery disease    a. NSTEMI 12/2015 - LHC 01/18/16: s/p overlapping DESx2 to RCA, 10% ost-prox Cx,10% mLAD.   Dementia ([place])    Depression    Diabetes mellitus    Dyslipidemia    GERD (gastroesophageal reflux disease)    History of seizure    Hypertension    Hypertensive heart disease    NSTEMI (non-ST elevated myocardial infarction) ([place]) 12/2015   Stage 3 chronic kidney disease (HCC)     Past Surgical History:  Procedure Laterality Date   ABDOMINAL HYSTERECTOMY     CARDIAC CATHETERIZATION  1994   minimal LAD dz, no other dz, EF normal   CARDIAC CATHETERIZATION N/A 01/18/2016   Procedure: Left Heart Cath and Coronary Angiography;  Surgeon: Burnell Blanks, MD;  Location: [facility];  Service: Cardiovascular;  Laterality: N/A;   CARDIAC CATHETERIZATION N/A 01/18/2016   Procedure: Coronary Stent Intervention;  Surgeon: Burnell Blanks, MD;  Location: [facility];  Service: Cardiovascular;  Laterality: N/A;   COLONOSCOPY WITH PROPOFOL N/A 04/09/2014   Procedure: COLONOSCOPY WITH PROPOFOL;  Surgeon: Cleotis Nipper, MD;  [facility]: [facility];  Service: Endoscopy;  Laterality: N/A;   CORONARY STENT PLACEMENT  01/19/2016   STENT SYNERGY DES [id number] drug eluting stent was successfully placed, and overlaps the 2.5 x 38 mm Synergy stent placed distally.   ESOPHAGOGASTRODUODENOSCOPY (EGD) WITH PROPOFOL N/A 04/09/2014   Procedure: ESOPHAGOGASTRODUODENOSCOPY (EGD) WITH PROPOFOL;  Surgeon: Cleotis Nipper, MD;  [facility];  Service: Endoscopy;  Laterality: N/A;   FOOT SURGERY Bilateral    approx ~2000   HEMORRHOID SURGERY     HERNIA REPAIR     KNEE ARTHROSCOPY     RADIOACTIVE SEED GUIDED EXCISIONAL BREAST BIOPSY Left 01/20/2021   Procedure: RADIOACTIVE SEED GUIDED EXCISIONAL LEFT BREAST BIOPSY;  Surgeon: Stark Klein, MD;  Location: [facility];  Service: General;  Laterality: Left;    There were no vitals filed for this visit.   Subjective Assessment - 05/10/21 1146     Subjective Pt states that she is still short-winded from asthma attack that put her in the hospital for 16 days. She states that she is not having diffiuclty with constipation recently, but notes that every time she sits to urinate, she also has BM come out without any urge to have BM.    Currently in Pain? No/denies  [facility] Adult PT Treatment/Exercise - 05/10/21 0001       Self-Care   Self-Care --   squatty potty and toileting position     Exercises   Exercises Lumbar;Knee/Hip      Lumbar Exercises: Stretches   Piriformis Stretch Right;Left;30 seconds;2 reps      Lumbar Exercises: Supine   Pelvic Tilt 2 reps   Pt instructed not to perform at this time - myofascial restriction in lumbar spine limiting movement   Other Supine Lumbar Exercises Lower trunk rotation 20x; SKTC 1x20 bil; DKTC 20 sec      Manual Therapy   Manual Therapy Myofascial release    Myofascial Release Abdominal/bowel release; L SL gluteal, lumbar paraspinals, T/L jxn release                     PT Education  - 05/10/21 1150     Education Details Toileting mechanics including squatty potty and position;    Person(s) Educated Patient    Methods Explanation;Demonstration;Tactile cues;Verbal cues;Handout    Comprehension Verbalized understanding              PT Short Term Goals - 03/30/21 1713       PT SHORT TERM GOAL #1   Title ind with ab massage    Time 4    Period Weeks    Status New    Target Date 04/27/21               PT Long Term Goals - 03/30/21 1713       PT LONG TERM GOAL #1   Title Pt reports BMs occurring 3-4/week without straining    Time 12    Period Weeks    Status New    Target Date 06/22/21      PT LONG TERM GOAL #2   Title Pt reports more solid stool due to improvd pelvic floor muscle tone    Time 12    Period Weeks    Status New    Target Date 06/22/21      PT LONG TERM GOAL #3   Title Pt will be ind with advanced HEP for pelvic strength    Time 12    Period Weeks    Status New    Target Date 06/22/21      PT LONG TERM GOAL #4   Title Pt will report 50% less leakage    Time 12    Period Weeks    Status New    Target Date 06/22/21                   Plan - 05/10/21 1154     Clinical Impression Statement Pt has significant bloating and distension in abdomen.  Today's session was focused on fascial release and improved trunk mobility.  Today was the first treatment since the eval so no progress noted yet.  Pt will benefit from skilled PT to continue to progress mobility and strength for improve bowel and bladder function without leakage.    PT Treatment/Interventions ADLs/Self Care Home Management;Biofeedback;Cryotherapy;Electrical Stimulation;Neuromuscular re-education;Therapeutic exercise;Therapeutic activities;Patient/family education;Manual techniques;Passive range of motion;Dry needling;Taping;Moist Heat    PT Next Visit Plan Continue to progress manual techniques for lumbothoracic mobility; progress stretches and f/u on  leakage; begin gentle strengthening; review toileting mechanics    Consulted and Agree with Plan of Care Patient             Patient will benefit from skilled therapeutic intervention in order  to improve the following deficits and impairments:  Pain, Increased fascial restricitons, Decreased strength, Decreased endurance  Visit Diagnosis: Muscle weakness (generalized)  Abnormal posture     Problem List Patient Active Problem List   Diagnosis Date Noted   Dental caries 04/20/2021   Abnormal swallowing 03/29/2021   Acute non-ST segment elevation myocardial infarction ([place]) 03/29/2021   Chronic diarrhea 03/29/2021   Daytime somnolence 03/29/2021   Abnormal mammogram of left breast 12/13/2020   Memory loss 03/20/2019   Severe persistent asthma without complication 79/98/7215   DM2 (diabetes mellitus, type 2) ([place]) 03/15/2018   Chronic kidney disease, stage 3a ([place]) 03/15/2018   Dyspnea 08/12/2017   Macrocytic anemia 08/12/2017   Therapeutic drug monitoring 05/17/2017   Lung nodule 08/01/2016   Chronic diastolic CHF (congestive heart failure) ([place]) 07/17/2016   Right leg swelling 02/27/2016   Uncontrolled type 2 diabetes mellitus with complication    Seizures ([place]) 01/27/2016   Numbness 01/27/2016   CAD in native artery 01/20/2016   Hypertensive heart disease 01/20/2016   NSTEMI (non-ST elevated myocardial infarction) ([place]) 01/18/2016   Dyspnea on exertion    Asthma exacerbation 07/21/2015   Diabetes mellitus type 2, controlled ([place]) 08/06/2014   Hyperlipidemia 08/06/2014   Allergic rhinitis 07/22/2008   Vocal cord dysfunction 07/22/2008   DYSPNEA 07/22/2008   Diabetes mellitus type 2, uncontrolled 07/21/2008   DIABETIC PERIPHERAL NEUROPATHY 07/21/2008   Depression with anxiety 07/21/2008   Essential hypertension 07/21/2008   Mild persistent chronic asthma without complication 87/27/6184   G E R D 07/21/2008   PULMONARY EMBOLISM, HX OF 07/21/2008    Jule Ser, PT 05/11/2021, 8:20 AM  [facility] @ [place], [place], [address] Phone: [phone]   Fax:  [phone]  Name: [name] MRN: [id number] Date of Birth: 09-Aug-1950

## 2021-05-10 NOTE — Patient Instructions (Signed)

## 2021-05-13 ENCOUNTER — Observation Stay (HOSPITAL_BASED_OUTPATIENT_CLINIC_OR_DEPARTMENT_OTHER)
Admission: EM | Admit: 2021-05-13 | Discharge: 2021-05-14 | Disposition: A | Discharge status: Home / Self Care | Payer: Medicare Other | Attending: Internal Medicine | Admitting: Internal Medicine

## 2021-05-13 ENCOUNTER — Emergency Department (HOSPITAL_BASED_OUTPATIENT_CLINIC_OR_DEPARTMENT_OTHER): Payer: Medicare Other | Admitting: Radiology

## 2021-05-13 ENCOUNTER — Encounter (HOSPITAL_BASED_OUTPATIENT_CLINIC_OR_DEPARTMENT_OTHER): Payer: Self-pay | Admitting: Emergency Medicine

## 2021-05-13 ENCOUNTER — Other Ambulatory Visit: Payer: Self-pay

## 2021-05-13 DIAGNOSIS — R062 Wheezing: Secondary | ICD-10-CM

## 2021-05-13 DIAGNOSIS — R799 Abnormal finding of blood chemistry, unspecified: Secondary | ICD-10-CM | POA: Diagnosis present

## 2021-05-13 DIAGNOSIS — J383 Other diseases of vocal cords: Secondary | ICD-10-CM | POA: Diagnosis present

## 2021-05-13 DIAGNOSIS — I5032 Chronic diastolic (congestive) heart failure: Secondary | ICD-10-CM | POA: Diagnosis present

## 2021-05-13 DIAGNOSIS — E1122 Type 2 diabetes mellitus with diabetic chronic kidney disease: Secondary | ICD-10-CM | POA: Diagnosis not present

## 2021-05-13 DIAGNOSIS — Z20822 Contact with and (suspected) exposure to covid-19: Secondary | ICD-10-CM | POA: Diagnosis not present

## 2021-05-13 DIAGNOSIS — N1831 Chronic kidney disease, stage 3a: Secondary | ICD-10-CM | POA: Diagnosis not present

## 2021-05-13 DIAGNOSIS — Z7984 Long term (current) use of oral hypoglycemic drugs: Secondary | ICD-10-CM | POA: Diagnosis not present

## 2021-05-13 DIAGNOSIS — Z79899 Other long term (current) drug therapy: Secondary | ICD-10-CM | POA: Diagnosis not present

## 2021-05-13 DIAGNOSIS — R531 Weakness: Secondary | ICD-10-CM

## 2021-05-13 DIAGNOSIS — F418 Other specified anxiety disorders: Secondary | ICD-10-CM | POA: Diagnosis present

## 2021-05-13 DIAGNOSIS — I10 Essential (primary) hypertension: Secondary | ICD-10-CM | POA: Diagnosis present

## 2021-05-13 DIAGNOSIS — I130 Hypertensive heart and chronic kidney disease with heart failure and stage 1 through stage 4 chronic kidney disease, or unspecified chronic kidney disease: Secondary | ICD-10-CM | POA: Insufficient documentation

## 2021-05-13 DIAGNOSIS — E785 Hyperlipidemia, unspecified: Secondary | ICD-10-CM | POA: Diagnosis present

## 2021-05-13 DIAGNOSIS — I2510 Atherosclerotic heart disease of native coronary artery without angina pectoris: Secondary | ICD-10-CM | POA: Diagnosis present

## 2021-05-13 DIAGNOSIS — E119 Type 2 diabetes mellitus without complications: Secondary | ICD-10-CM

## 2021-05-13 DIAGNOSIS — J4550 Severe persistent asthma, uncomplicated: Secondary | ICD-10-CM | POA: Diagnosis present

## 2021-05-13 DIAGNOSIS — K219 Gastro-esophageal reflux disease without esophagitis: Secondary | ICD-10-CM | POA: Diagnosis present

## 2021-05-13 LAB — CBC WITH DIFFERENTIAL/PLATELET
Abs Immature Granulocytes: 0.12 10*3/uL — ABNORMAL HIGH (ref 0.00–0.07)
Basophils Absolute: 0.1 10*3/uL (ref 0.0–0.1)
Basophils Relative: 1 %
Eosinophils Absolute: 0.1 10*3/uL (ref 0.0–0.5)
Eosinophils Relative: 1 %
HCT: 30.9 % — ABNORMAL LOW (ref 36.0–46.0)
Hemoglobin: 10.1 g/dL — ABNORMAL LOW (ref 12.0–15.0)
Immature Granulocytes: 1 %
Lymphocytes Relative: 16 %
Lymphs Abs: 1.4 10*3/uL (ref 0.7–4.0)
MCH: 30.8 pg (ref 26.0–34.0)
MCHC: 32.7 g/dL (ref 30.0–36.0)
MCV: 94.2 fL (ref 80.0–100.0)
Monocytes Absolute: 0.9 10*3/uL (ref 0.1–1.0)
Monocytes Relative: 10 %
Neutro Abs: 6.5 10*3/uL (ref 1.7–7.7)
Neutrophils Relative %: 71 %
Platelets: 177 10*3/uL (ref 150–400)
RBC: 3.28 MIL/uL — ABNORMAL LOW (ref 3.87–5.11)
RDW: 14.5 % (ref 11.5–15.5)
WBC: 9 10*3/uL (ref 4.0–10.5)
nRBC: 0 % (ref 0.0–0.2)

## 2021-05-13 LAB — BASIC METABOLIC PANEL
Anion gap: 9 (ref 5–15)
BUN: 20 mg/dL (ref 8–23)
CO2: 22 mmol/L (ref 22–32)
Calcium: 8.3 mg/dL — ABNORMAL LOW (ref 8.9–10.3)
Chloride: 104 mmol/L (ref 98–111)
Creatinine, Ser: 1.12 mg/dL — ABNORMAL HIGH (ref 0.44–1.00)
GFR, Estimated: 52 mL/min — ABNORMAL LOW (ref >60)
Glucose, Bld: 366 mg/dL — ABNORMAL HIGH (ref 70–99)
Potassium: 3.6 mmol/L (ref 3.5–5.1)
Sodium: 135 mmol/L (ref 135–145)

## 2021-05-13 LAB — COMPREHENSIVE METABOLIC PANEL
ALT: 17 U/L (ref 0–44)
AST: 16 U/L (ref 15–41)
Albumin: 3.8 g/dL (ref 3.5–5.0)
Alkaline Phosphatase: 52 U/L (ref 38–126)
Anion gap: 10 (ref 5–15)
BUN: 22 mg/dL (ref 8–23)
CO2: 25 mmol/L (ref 22–32)
Calcium: 8.2 mg/dL — ABNORMAL LOW (ref 8.9–10.3)
Chloride: 102 mmol/L (ref 98–111)
Creatinine, Ser: 1.16 mg/dL — ABNORMAL HIGH (ref 0.44–1.00)
GFR, Estimated: 50 mL/min — ABNORMAL LOW (ref >60)
Glucose, Bld: 240 mg/dL — ABNORMAL HIGH (ref 70–99)
Potassium: 3.6 mmol/L (ref 3.5–5.1)
Sodium: 137 mmol/L (ref 135–145)
Total Bilirubin: 0.6 mg/dL (ref 0.3–1.2)
Total Protein: 6.4 g/dL — ABNORMAL LOW (ref 6.5–8.1)

## 2021-05-13 LAB — TSH: TSH: 0.622 u[IU]/mL (ref 0.350–4.500)

## 2021-05-13 LAB — BRAIN NATRIURETIC PEPTIDE: B Natriuretic Peptide: 15.9 pg/mL (ref 0.0–100.0)

## 2021-05-13 LAB — RESP PANEL BY RT-PCR (FLU A&B, COVID) ARPGX2
Influenza A by PCR: NEGATIVE
Influenza B by PCR: NEGATIVE
SARS Coronavirus 2 by RT PCR: NEGATIVE

## 2021-05-13 LAB — GLUCOSE, CAPILLARY: Glucose-Capillary: 383 mg/dL — ABNORMAL HIGH (ref 70–99)

## 2021-05-13 LAB — MAGNESIUM
Magnesium: 0.8 mg/dL — CL (ref 1.7–2.4)
Magnesium: 2.4 mg/dL (ref 1.7–2.4)

## 2021-05-13 LAB — TROPONIN I (HIGH SENSITIVITY): Troponin I (High Sensitivity): 5 ng/L (ref <18)

## 2021-05-13 LAB — CBG MONITORING, ED: Glucose-Capillary: 337 mg/dL — ABNORMAL HIGH (ref 70–99)

## 2021-05-13 LAB — PHOSPHORUS: Phosphorus: 3.4 mg/dL (ref 2.5–4.6)

## 2021-05-13 MED ORDER — FLUTICASONE PROPIONATE 50 MCG/ACT NA SUSP: 2.0000 | Freq: Every day | NASAL | Status: DC | PRN

## 2021-05-13 MED ORDER — POLYETHYLENE GLYCOL 3350 17 G PO PACK: 17.0000 g | PACK | Freq: Every day | ORAL | Status: DC | PRN

## 2021-05-13 MED ORDER — ALPRAZOLAM 0.5 MG PO TABS
0.5000 mg | ORAL_TABLET | Freq: Every day | ORAL | Status: DC
  Administered 2021-05-13: 0.5 mg via ORAL
  Filled 2021-05-13: qty 1

## 2021-05-13 MED ORDER — MAGNESIUM SULFATE 2 GM/50ML IV SOLN
2.0000 g | Freq: Once | INTRAVENOUS | Status: AC
  Administered 2021-05-13: 2 g via INTRAVENOUS
  Filled 2021-05-13: qty 50

## 2021-05-13 MED ORDER — MEMANTINE HCL 10 MG PO TABS
10.0000 mg | ORAL_TABLET | Freq: Two times a day (BID) | ORAL | Status: DC
  Administered 2021-05-13 – 2021-05-14 (×2): 10 mg via ORAL
  Filled 2021-05-13 (×2): qty 1

## 2021-05-13 MED ORDER — TIZANIDINE HCL 4 MG PO TABS
2.0000 mg | ORAL_TABLET | Freq: Once | ORAL | Status: AC
  Administered 2021-05-13: 2 mg via ORAL
  Filled 2021-05-13: qty 1

## 2021-05-13 MED ORDER — NEBIVOLOL HCL 10 MG PO TABS
10.0000 mg | ORAL_TABLET | Freq: Every day | ORAL | Status: DC
  Administered 2021-05-13 – 2021-05-14 (×2): 10 mg via ORAL
  Filled 2021-05-13 (×2): qty 1

## 2021-05-13 MED ORDER — ENSURE ENLIVE PO LIQD
237.0000 mL | Freq: Three times a day (TID) | ORAL | Status: DC
  Administered 2021-05-14: 237 mL via ORAL

## 2021-05-13 MED ORDER — ONDANSETRON HCL 4 MG PO TABS: 4.0000 mg | ORAL_TABLET | Freq: Four times a day (QID) | ORAL | Status: DC | PRN

## 2021-05-13 MED ORDER — ONDANSETRON HCL 4 MG/2ML IJ SOLN: 4.0000 mg | Freq: Four times a day (QID) | INTRAMUSCULAR | Status: DC | PRN

## 2021-05-13 MED ORDER — ACETAMINOPHEN 325 MG PO TABS
650.0000 mg | ORAL_TABLET | Freq: Four times a day (QID) | ORAL | Status: DC | PRN
  Administered 2021-05-13 – 2021-05-14 (×2): 650 mg via ORAL
  Filled 2021-05-13 (×2): qty 2

## 2021-05-13 MED ORDER — METHOCARBAMOL 500 MG PO TABS
500.0000 mg | ORAL_TABLET | Freq: Three times a day (TID) | ORAL | Status: DC
  Administered 2021-05-13 – 2021-05-14 (×3): 500 mg via ORAL
  Filled 2021-05-13 (×3): qty 1

## 2021-05-13 MED ORDER — ALBUTEROL SULFATE (2.5 MG/3ML) 0.083% IN NEBU: 2.5000 mg | INHALATION_SOLUTION | RESPIRATORY_TRACT | Status: DC | PRN

## 2021-05-13 MED ORDER — MONTELUKAST SODIUM 10 MG PO TABS
10.0000 mg | ORAL_TABLET | Freq: Every day | ORAL | Status: DC
  Administered 2021-05-13: 10 mg via ORAL
  Filled 2021-05-13: qty 1

## 2021-05-13 MED ORDER — INSULIN ASPART 100 UNIT/ML IJ SOLN
0.0000 [IU] | Freq: Three times a day (TID) | INTRAMUSCULAR | Status: DC
  Administered 2021-05-14: 5 [IU] via SUBCUTANEOUS
  Administered 2021-05-14: 3 [IU] via SUBCUTANEOUS

## 2021-05-13 MED ORDER — ENOXAPARIN SODIUM 40 MG/0.4ML IJ SOSY
40.0000 mg | PREFILLED_SYRINGE | INTRAMUSCULAR | Status: DC
  Administered 2021-05-13: 40 mg via SUBCUTANEOUS
  Filled 2021-05-13: qty 0.4

## 2021-05-13 MED ORDER — IPRATROPIUM-ALBUTEROL 0.5-2.5 (3) MG/3ML IN SOLN
3.0000 mL | Freq: Four times a day (QID) | RESPIRATORY_TRACT | Status: DC
  Administered 2021-05-13 – 2021-05-14 (×3): 3 mL via RESPIRATORY_TRACT
  Filled 2021-05-13 (×3): qty 3

## 2021-05-13 MED ORDER — DIVALPROEX SODIUM ER 500 MG PO TB24
500.0000 mg | ORAL_TABLET | Freq: Every day | ORAL | Status: DC
  Administered 2021-05-13: 500 mg via ORAL
  Filled 2021-05-13: qty 1

## 2021-05-13 MED ORDER — ALPRAZOLAM 0.5 MG PO TABS: 0.5000 mg | ORAL_TABLET | Freq: Every day | ORAL | Status: DC | PRN

## 2021-05-13 MED ORDER — DILTIAZEM HCL ER COATED BEADS 240 MG PO CP24
240.0000 mg | ORAL_CAPSULE | Freq: Every day | ORAL | Status: DC
  Administered 2021-05-13 – 2021-05-14 (×2): 240 mg via ORAL
  Filled 2021-05-13 (×2): qty 1

## 2021-05-13 MED ORDER — ATORVASTATIN CALCIUM 40 MG PO TABS
40.0000 mg | ORAL_TABLET | Freq: Every day | ORAL | Status: DC
  Administered 2021-05-13: 40 mg via ORAL
  Filled 2021-05-13: qty 1

## 2021-05-13 MED ORDER — BUDESONIDE 0.5 MG/2ML IN SUSP
0.5000 mg | Freq: Two times a day (BID) | RESPIRATORY_TRACT | Status: DC
  Administered 2021-05-13 – 2021-05-14 (×2): 0.5 mg via RESPIRATORY_TRACT
  Filled 2021-05-13 (×3): qty 2

## 2021-05-13 MED ORDER — PANTOPRAZOLE SODIUM 40 MG PO TBEC
40.0000 mg | DELAYED_RELEASE_TABLET | Freq: Every day | ORAL | Status: DC
  Administered 2021-05-14: 40 mg via ORAL
  Filled 2021-05-13: qty 1

## 2021-05-13 MED ORDER — ACETAMINOPHEN 650 MG RE SUPP: 650.0000 mg | Freq: Four times a day (QID) | RECTAL | Status: DC | PRN

## 2021-05-13 MED ORDER — POTASSIUM CHLORIDE CRYS ER 20 MEQ PO TBCR
40.0000 meq | EXTENDED_RELEASE_TABLET | Freq: Once | ORAL | Status: AC
  Administered 2021-05-13: 40 meq via ORAL
  Filled 2021-05-13: qty 2

## 2021-05-13 MED ORDER — INSULIN ASPART 100 UNIT/ML IJ SOLN
0.0000 [IU] | Freq: Every day | INTRAMUSCULAR | Status: DC
  Administered 2021-05-13: 5 [IU] via SUBCUTANEOUS

## 2021-05-13 MED ORDER — ARFORMOTEROL TARTRATE 15 MCG/2ML IN NEBU
15.0000 ug | INHALATION_SOLUTION | Freq: Two times a day (BID) | RESPIRATORY_TRACT | Status: DC
  Administered 2021-05-13 – 2021-05-14 (×2): 15 ug via RESPIRATORY_TRACT
  Filled 2021-05-13 (×3): qty 2

## 2021-05-13 MED ORDER — LATANOPROST 0.005 % OP SOLN
1.0000 [drp] | Freq: Every day | OPHTHALMIC | Status: DC
  Administered 2021-05-13: 1 [drp] via OPHTHALMIC
  Filled 2021-05-13: qty 2.5

## 2021-05-13 NOTE — Assessment & Plan Note (Signed)
Suspecting that is responsible for patient's upper airway wheezing. Recommend ENT follow-up and referral outpatient.

## 2021-05-13 NOTE — ED Notes (Signed)
Pt asymptomatic r/t cbg

## 2021-05-13 NOTE — Assessment & Plan Note (Addendum)
Hemoglobin A1c 1.8. On metformin at home

## 2021-05-13 NOTE — Assessment & Plan Note (Addendum)
Patient denies having any wheezing when she went to her PCPs office. Possibly that her wheezing actually started getting worse on 1/13 only. Continue nebulizer therapy, Brovana and Pulmicort.  As needed DuoNebs.  I do not think the patient requires steroids for now. Monitor.  During last admission steroid-induced severe volume overload.

## 2021-05-13 NOTE — Assessment & Plan Note (Signed)
Myoview in 2022 negative. Monitor for now.

## 2021-05-13 NOTE — Assessment & Plan Note (Signed)
Renal function stable close to baseline of 1.16. Monitor.

## 2021-05-13 NOTE — ED Notes (Signed)
Report given to [facility] 

## 2021-05-13 NOTE — Assessment & Plan Note (Signed)
On Lipitor at home. Continue.

## 2021-05-13 NOTE — ED Notes (Signed)
Pt reports generalized body and can not point to a specific are of pain. Sh stated that she feel better than when she arrived abd rates her pain as a"generalized 6" out of 10

## 2021-05-13 NOTE — Assessment & Plan Note (Signed)
Continue PPI ?

## 2021-05-13 NOTE — H&P (Signed)
History and Physical    Patient: [name] ZHG:[id number] DOB: Nov 24, 1950 DOA: 05/13/2021 DOS: the patient was seen and examined on 05/13/2021 PCP: [name], MD  Patient coming from: Home  Chief Complaint: Shortness of breath and abnormal lab.  HPI: [name] is a 71 y.o. female with medical history significant of past medical history of severe persistent asthma, HTN, type II DM, chronic diastolic CHF, CAD, CKD 3A, GERD. Patient presented with complaints of muscle spasm and muscle cramps ongoing since Tuesday. Went to see PCP who did some blood work at which time they found that her magnesium level low and therefore patient was referred to ER for further work-up and treatment. Tuesday night patient had significant cramping of her hands but it resolved on its own.  Throughout the night and next day she continues to have intense cramping of her whole body.  She tried some mustard without any improvement at which time they called her PCP. Denies any nausea or vomiting denies any diarrhea.  Denies any change in urinary patterns.  No fever no chills.  No chest pain abdominal pain.  No changes to medications reported by the patient. Reportedly when the patient went to see her PCP there were no wheezing. Patient has not had an ENT referral. Patient remains compliant with all her medication.  Does not use excessive albuterol therapy.  Review of Systems: As mentioned in the history of present illness. All other systems reviewed and are negative. Past Medical History:  Diagnosis Date   Anxiety    Arthritis    Asthma    Chronic diastolic CHF (congestive heart failure) ([place]) 07/17/2016   Coronary artery disease    a. NSTEMI 12/2015 - LHC 01/18/16: s/p overlapping DESx2 to RCA, 10% ost-prox Cx,10% mLAD.   Dementia ([place])    Depression    Diabetes mellitus    Dyslipidemia    GERD (gastroesophageal reflux disease)    History of seizure    Hypertension    Hypertensive heart disease     NSTEMI (non-ST elevated myocardial infarction) ([place]) 12/2015   Stage 3 chronic kidney disease (HCC)    Past Surgical History:  Procedure Laterality Date   ABDOMINAL HYSTERECTOMY     CARDIAC CATHETERIZATION  1994   minimal LAD dz, no other dz, EF normal   CARDIAC CATHETERIZATION N/A 01/18/2016   Procedure: Left Heart Cath and Coronary Angiography;  Surgeon: Burnell Blanks, MD;  Location: [facility];  Service: Cardiovascular;  Laterality: N/A;   CARDIAC CATHETERIZATION N/A 01/18/2016   Procedure: Coronary Stent Intervention;  Surgeon: Burnell Blanks, MD;  Location: [facility];  Service: Cardiovascular;  Laterality: N/A;   COLONOSCOPY WITH PROPOFOL N/A 04/09/2014   Procedure: COLONOSCOPY WITH PROPOFOL;  Surgeon: Cleotis Nipper, MD;  [facility];  Service: Endoscopy;  Laterality: N/A;   CORONARY STENT PLACEMENT  01/19/2016   STENT SYNERGY DES [id number] drug eluting stent was successfully placed, and overlaps the 2.5 x 38 mm Synergy stent placed distally.   ESOPHAGOGASTRODUODENOSCOPY (EGD) WITH PROPOFOL N/A 04/09/2014   Procedure: ESOPHAGOGASTRODUODENOSCOPY (EGD) WITH PROPOFOL;  Surgeon: Cleotis Nipper, MD;  [facility];  Service: Endoscopy;  Laterality: N/A;   FOOT SURGERY Bilateral    approx ~2000   HEMORRHOID SURGERY     HERNIA REPAIR     KNEE ARTHROSCOPY     RADIOACTIVE SEED GUIDED EXCISIONAL BREAST BIOPSY Left 01/20/2021   Procedure: RADIOACTIVE SEED GUIDED EXCISIONAL LEFT BREAST BIOPSY;  Surgeon: Barry Dienes,  Dorris Fetch, MD;  Location: [facility];  Service: General;  Laterality: Left;   Social History:  reports that she quit smoking about 17 years ago. Her smoking use included cigarettes. She has a 30.00 pack-year smoking history. She has never used smokeless tobacco. She reports that she does not drink alcohol and does not use drugs.  Allergies  Allergen Reactions   Chlorthalidone Other (See Comments)    Was stopped due to kidney function    Tramadol Other (See Comments)    Not to be taken because of existing seizure disorder Other reaction(s): seizures    Family History  Problem Relation Age of Onset   Allergies Mother    Asthma Mother    CAD Father 84   Diabetes Father    Hypertension Father    Congestive Heart Failure Sister        died in her 76s   CAD Sister 14    Prior to Admission medications   Medication Sig Start Date End Date Taking? Authorizing Provider  albuterol (PROAIR HFA) 108 (90 Base) MCG/ACT inhaler Inhale 2 puffs into the lungs every 4 (four) hours as needed for wheezing or shortness of breath. 04/20/21   Parrett, Fonnie Mu, NP  ALPRAZolam (XANAX) 0.5 MG tablet Take 1 tablet (0.5 mg total) by mouth 2 (two) times daily as needed for anxiety. Patient taking differently: Take 0.5 mg by mouth See admin instructions. Take 0.5 mg by mouth at bedtime and an additional 0.5 mg once a day as needed for anxiety 07/31/15   Theodis Blaze, MD  atorvastatin (LIPITOR) 40 MG tablet Take 40 mg by mouth at bedtime.    [provider]  Biotin 5000 MCG CAPS Take 5,000 mcg by mouth daily.    [provider]  budesonide (PULMICORT) 0.5 MG/2ML nebulizer solution USE 1 VIAL IN NEBULIZER IN THE MORNING AND AT BEDTIME Patient taking differently: Take 0.5 mg by nebulization 2 (two) times daily. 03/29/21   Tanda Rockers, MD  diltiazem (CARDIZEM CD) 240 MG 24 hr capsule Take 1 capsule (240 mg total) by mouth daily. 03/28/21   Jerline Pain, MD  divalproex (DEPAKOTE ER) 500 MG 24 hr tablet Take 1 tablet (500 mg total) by mouth at bedtime. 03/15/21   Marcial Pacas, MD  esomeprazole (NEXIUM) 40 MG capsule Take 40 mg by mouth 2 (two) times daily before a meal.    [provider]  fluticasone (FLONASE) 50 MCG/ACT nasal spray Place 2 sprays into both nostrils daily as needed for allergies.    [provider]  formoterol (PERFOROMIST) 20 MCG/2ML nebulizer solution USE 1 VIAL  IN  NEBULIZER TWICE  DAILY -  morning and evening Patient taking differently: 20 mcg 2 (two) times daily. 03/29/21   Tanda Rockers, MD  hydrALAZINE (APRESOLINE) 100 MG tablet Take 1 tablet (100 mg total) by mouth 3 (three) times daily. Patient not taking: Reported on 04/22/2021 04/22/21   Jerline Pain, MD  ipratropium-albuterol (DUONEB) 0.5-2.5 (3) MG/3ML SOLN Take 3 mLs by nebulization every 8 (eight) hours as needed (SOB). 04/10/21   Ghimire, Henreitta Leber, MD  latanoprost (XALATAN) 0.005 % ophthalmic solution Place 1 drop into both eyes at bedtime. 02/05/20   [provider]  memantine (NAMENDA) 10 MG tablet Take 1 tablet (10 mg total) by mouth 2 (two) times daily. Patient taking differently: Take 10 mg by mouth in the morning and at bedtime. 03/15/21   Marcial Pacas, MD  metFORMIN (GLUCOPHAGE) 500 MG tablet Take  500 mg by mouth 2 (two) times daily with a meal.    [provider]  montelukast (SINGULAIR) 10 MG tablet Take 1 tablet (10 mg total) by mouth at bedtime. 01/11/21   Tanda Rockers, MD  Multiple Vitamin (MULTIVITAMIN WITH MINERALS) TABS tablet Take 1 tablet by mouth every morning.    [provider]  nebivolol (BYSTOLIC) 10 MG tablet Take 1 tablet (10 mg total) by mouth daily. 04/10/19   Jerline Pain, MD  nitroGLYCERIN (NITROSTAT) 0.4 MG SL tablet DISSOLVE ONE TABLET UNDER THE TONGUE EVERY 5 MINUTES AS NEEDED FOR CHEST PAIN.  DO NOT EXCEED A TOTAL OF 3 DOSES IN 15 MINUTES Patient taking differently: 0.4 mg every 5 (five) minutes as needed for chest pain. 11/19/19   Burtis Junes, NP  Probiotic Product (EQ PROBIOTIC) CAPS Take 1 capsule by mouth every morning.    [provider]  Psyllium (METAMUCIL PO) Take by mouth See admin instructions. Mix powder as directed and drink once a day as directed for constipation    [provider]    Physical Exam: Vitals:   05/13/21 1400 05/13/21 1500 05/13/21 1630 05/13/21 1745  BP: (!) 148/91 (!) 148/70 (!) 154/82 (!) 187/88  Pulse:  89 90 84 94  Resp: 18 19 18 18   Temp:    98.1 F (36.7 C)  TempSrc:    Oral  SpO2: 100% 98% 95% 100%  Weight:      Height:       General: Appear in mild distress, no Rash; Oral Mucosa clear. no Abnormal Neck Mass Or lumps, Conjunctiva normal  Cardiovascular: S1 and S2 Present, no Murmur Respiratory: increased respiratory effort, Bilateral Air entry present and no Crackles, bilateral expiratory upper airway wheezes Abdomen: Bowel Sound present, Soft and no tenderness Extremities: trace Pedal edema Neurology: alert and oriented to time, place, and person affect appropriate. no new focal deficit Gait not checked due to patient safety concerns     Data Reviewed:  CBG elevated at 337.  Serum creatinine remained stable 1.16.  BUN normal.  Potassium level low normal.  Magnesium level severely low 0.8.  Hemoglobin WBC platelet stable.  Chest x-ray independently reviewed shows no evidence of acute pneumonia.  Assessment/Plan * Hypomagnesemia- (present on admission) Presents with complaints of muscle cramps, body aches as well as muscle spasms ongoing for last few days. Symptoms started Wednesday. Lab work at the PCPs office shows hypomagnesemia. Patient received 4 g of IV magnesium in the ER. Currently on telemetry. Will recheck the value and treat as needed. Potassium low normal, will replace.  Severe persistent asthma without complication- (present on admission) Patient denies having any wheezing when she went to her PCPs office. Possibly that her wheezing actually started getting worse on 1/13 only. Continue nebulizer therapy, Brovana and Pulmicort.  As needed DuoNebs.  I do not think the patient requires steroids for now. Monitor.  During last admission steroid-induced severe volume overload.  Chronic diastolic CHF (congestive heart failure) ([place])- (present on admission) Volume status adequate. Does not appear to be volume overloaded.  Weight actually down 15 pound after discharge  from the hospital.   Chronic kidney disease, stage 3a ([place])- (present on admission) Renal function stable close to baseline of 1.16. Monitor.  CAD in native artery- (present on admission) Myoview in 2022 negative. Monitor for now.   Diabetes mellitus type 2, controlled ([place]) Hemoglobin A1c 1.8. Will monitor blood sugars which were elevated for now. On metformin at home we  will continue sliding scale insulin.  Essential hypertension- (present on admission) Blood pressure stable. Continue home regimen.  G E R D- (present on admission) Continue PPI.  Depression with anxiety- (present on admission) Continue Xanax home regimen.  Hyperlipidemia- (present on admission) On Lipitor at home. Continue.  Vocal cord dysfunction- (present on admission) Suspecting that is responsible for patient's upper airway wheezing. Recommend ENT follow-up and referral outpatient.    Advance Care Planning:   Code Status: Full Code   Consults: none  Family Communication: Son at bedside.  Severity of Illness: The appropriate patient status for this patient is OBSERVATION. Observation status is judged to be reasonable and necessary in order to provide the required intensity of service to ensure the patient's safety. The patient's presenting symptoms, physical exam findings, and initial radiographic and laboratory data in the context of their medical condition is felt to place them at decreased risk for further clinical deterioration. Furthermore, it is anticipated that the patient will be medically stable for discharge from the hospital within 2 midnights of admission.   Author: Berle Mull, MD 05/13/2021 6:28 PM  For on call review [email or web address].

## 2021-05-13 NOTE — Assessment & Plan Note (Signed)
Continue Xanax home regimen.

## 2021-05-13 NOTE — ED Triage Notes (Signed)
Pt reports having labs drawn yesterday for follow-up and her magnesium was 0.9. Endorses SOB for a month and generalized body cramping.

## 2021-05-13 NOTE — ED Provider Notes (Addendum)
[facility] DEPT Provider Note   CSN: [id number] Arrival date & time: 05/13/21  0940     History  Chief Complaint  Patient presents with   Abnormal Lab    [name] is a 71 y.o. female.  HPI Patient had lab work done yesterday and today results were positive for severely low magnesium.  She had seen her PCP the day before due to severe full body cramps.  Patient reports on Tuesday she had some cramping in her hands and she did not think much about that.  She reports next night however she got intense cramping throughout all of her body.  She reports her husband gave her some mustard to try and it seemed to eventually help but the intense body wide cramping lasted for quite a while.  She reports that before that she was feeling pretty well.  She has just completed a prolonged hospitalization and has been home for 2 weeks.  She has been having a lot of difficulty with respiratory symptoms over the past month.  She has had recurrent issues of wheezing and shortness of breath.  Patient is just now finishing a steroid taper since her discharge.  She reports she also is on antibiotics and thought maybe this would cause the symptoms.    Home Medications Prior to Admission medications   Medication Sig Start Date End Date Taking? Authorizing Provider  albuterol (PROAIR HFA) 108 (90 Base) MCG/ACT inhaler Inhale 2 puffs into the lungs every 4 (four) hours as needed for wheezing or shortness of breath. 04/20/21   Parrett, Fonnie Mu, NP  ALPRAZolam (XANAX) 0.5 MG tablet Take 1 tablet (0.5 mg total) by mouth 2 (two) times daily as needed for anxiety. Patient taking differently: Take 0.5 mg by mouth See admin instructions. Take 0.5 mg by mouth at bedtime and an additional 0.5 mg once a day as needed for anxiety 07/31/15   Theodis Blaze, MD  atorvastatin (LIPITOR) 40 MG tablet Take 40 mg by mouth at bedtime.    [provider]  Biotin 5000 MCG CAPS Take 5,000 mcg by  mouth daily.    [provider]  budesonide (PULMICORT) 0.5 MG/2ML nebulizer solution USE 1 VIAL IN NEBULIZER IN THE MORNING AND AT BEDTIME Patient taking differently: Take 0.5 mg by nebulization 2 (two) times daily. 03/29/21   Tanda Rockers, MD  chlorpheniramine-HYDROcodone (TUSSIONEX) 10-8 MG/5ML SUER Take 5 mLs by mouth every 12 (twelve) hours as needed for cough. 04/10/21   Ghimire, Henreitta Leber, MD  diltiazem (CARDIZEM CD) 240 MG 24 hr capsule Take 1 capsule (240 mg total) by mouth daily. 03/28/21   Jerline Pain, MD  divalproex (DEPAKOTE ER) 500 MG 24 hr tablet Take 1 tablet (500 mg total) by mouth at bedtime. 03/15/21   Marcial Pacas, MD  esomeprazole (NEXIUM) 40 MG capsule Take 40 mg by mouth 2 (two) times daily before a meal.    [provider]  fluticasone (FLONASE) 50 MCG/ACT nasal spray Place 2 sprays into both nostrils daily as needed for allergies.    [provider]  formoterol (PERFOROMIST) 20 MCG/2ML nebulizer solution USE 1 VIAL  IN  NEBULIZER TWICE  DAILY - morning and evening Patient taking differently: 20 mcg 2 (two) times daily. 03/29/21   Tanda Rockers, MD  hydrALAZINE (APRESOLINE) 100 MG tablet Take 1 tablet (100 mg total) by mouth 3 (three) times daily. Patient not taking: Reported on 04/22/2021 04/22/21   Jerline Pain, MD  ipratropium-albuterol (DUONEB) 0.5-2.5 (3) MG/3ML SOLN Take 3 mLs by nebulization every 8 (eight) hours as needed (SOB). 04/10/21   Ghimire, Henreitta Leber, MD  latanoprost (XALATAN) 0.005 % ophthalmic solution Place 1 drop into both eyes at bedtime. 02/05/20   [provider]  loratadine (CLARITIN) 10 MG tablet Take 1 tablet (10 mg total) by mouth daily. Patient taking differently: Take 10 mg by mouth daily as needed for allergies or rhinitis. 04/11/21   Ghimire, Henreitta Leber, MD  melatonin 10 MG TABS Take 10 mg by mouth at bedtime as needed. 04/28/21   Lavina Hamman, MD  memantine (NAMENDA) 10 MG tablet Take 1 tablet (10 mg  total) by mouth 2 (two) times daily. Patient taking differently: Take 10 mg by mouth in the morning and at bedtime. 03/15/21   Marcial Pacas, MD  metFORMIN (GLUCOPHAGE) 500 MG tablet Take 500 mg by mouth 2 (two) times daily with a meal.    [provider]  montelukast (SINGULAIR) 10 MG tablet Take 1 tablet (10 mg total) by mouth at bedtime. 01/11/21   Tanda Rockers, MD  Multiple Vitamin (MULTIVITAMIN WITH MINERALS) TABS tablet Take 1 tablet by mouth every morning.    [provider]  nebivolol (BYSTOLIC) 10 MG tablet Take 1 tablet (10 mg total) by mouth daily. 04/10/19   Jerline Pain, MD  nitroGLYCERIN (NITROSTAT) 0.4 MG SL tablet DISSOLVE ONE TABLET UNDER THE TONGUE EVERY 5 MINUTES AS NEEDED FOR CHEST PAIN.  DO NOT EXCEED A TOTAL OF 3 DOSES IN 15 MINUTES Patient taking differently: 0.4 mg every 5 (five) minutes as needed for chest pain. 11/19/19   Burtis Junes, NP  predniSONE (DELTASONE) 10 MG tablet Take 40mg  daily for 3days,Take 30mg  daily for 3days,Take 20mg  daily for 3days,Take 10mg  daily for 3days, then stop 04/28/21   Lavina Hamman, MD  Probiotic Product (EQ PROBIOTIC) CAPS Take 1 capsule by mouth every morning.    [provider]  Psyllium (METAMUCIL PO) Take by mouth See admin instructions. Mix powder as directed and drink once a day as directed for constipation    [provider]      Allergies    Chlorthalidone and Tramadol    Review of Systems   Review of Systems 10 systems reviewed and negative except as per HPI Physical Exam Updated Vital Signs BP (!) 155/79    Pulse 87    Temp 98.7 F (37.1 C) (Oral)    Resp 16    Ht 5\' 2"  (1.575 m)    Wt 78.6 kg    LMP  (LMP Unknown)    SpO2 95%    BMI 31.69 kg/m  Physical Exam Constitutional:      Comments: Patient is alert nontoxic.  Clear mental status.  HENT:     Mouth/Throat:     Pharynx: Oropharynx is clear.  Eyes:     Extraocular Movements: Extraocular movements intact.  Cardiovascular:      Comments: Borderline tachycardia.  Slightly distant heart sounds Pulmonary:     Comments: Patient has fine expiratory wheeze with breath sounds slightly diminished at the bases. Abdominal:     General: There is no distension.     Palpations: Abdomen is soft.     Tenderness: There is no abdominal tenderness. There is no guarding.  Musculoskeletal:        General: No swelling or tenderness. Normal range of motion.     Cervical back: Neck supple.     Right lower  leg: No edema.     Left lower leg: No edema.  Skin:    General: Skin is warm and dry.  Neurological:     General: No focal deficit present.     Mental Status: She is oriented to person, place, and time.     Motor: No weakness.     Coordination: Coordination normal.  Psychiatric:        Mood and Affect: Mood normal.    ED Results / Procedures / Treatments   Labs (all labs ordered are listed, but only abnormal results are displayed) Labs Reviewed  COMPREHENSIVE METABOLIC PANEL - Abnormal; Notable for the following components:      Result Value   Glucose, Bld 240 (*)    Creatinine, Ser 1.16 (*)    Calcium 8.2 (*)    Total Protein 6.4 (*)    GFR, Estimated 50 (*)    All other components within normal limits  CBC WITH DIFFERENTIAL/PLATELET - Abnormal; Notable for the following components:   RBC 3.28 (*)    Hemoglobin 10.1 (*)    HCT 30.9 (*)    Abs Immature Granulocytes 0.12 (*)    All other components within normal limits  MAGNESIUM - Abnormal; Notable for the following components:   Magnesium 0.8 (*)    All other components within normal limits  RESP PANEL BY RT-PCR (FLU A&B, COVID) ARPGX2  PHOSPHORUS  BRAIN NATRIURETIC PEPTIDE  URINALYSIS, ROUTINE W REFLEX MICROSCOPIC  TSH  T3 UPTAKE  TROPONIN I (HIGH SENSITIVITY)    EKG EKG Interpretation  Date/Time:  Friday May 13 2021 09:51:17 EST Ventricular Rate:  93 PR Interval:  142 QRS Duration: 83 QT Interval:  333 QTC Calculation: 415 R  Axis:   -4 Text Interpretation: Sinus rhythm Left ventricular hypertrophy Borderline T abnormalities, inferior leads agree, no sig change from previous Confirmed by Charlesetta Shanks [phone]) on 05/13/2021 10:41:11 AM  Radiology DG Chest 2 View  Result Date: 05/13/2021 CLINICAL DATA:  Wheezing. EXAM: CHEST - 2 VIEW COMPARISON:  Chest x-ray dated May 03, 2021. FINDINGS: The heart size and mediastinal contours are within normal limits. Both lungs are clear. The visualized skeletal structures are unremarkable. IMPRESSION: No active cardiopulmonary disease. Electronically Signed   By: Titus Dubin M.D.   On: 05/13/2021 11:09    Procedures Procedures    Medications Ordered in ED Medications  magnesium sulfate IVPB 2 g 50 mL (2 g Intravenous New Bag/Given 05/13/21 1137)  magnesium sulfate IVPB 2 g 50 mL (has no administration in time range)    ED Course/ Medical Decision Making/ A&P                           Medical Decision Making  EMR reviewed for additional medical history.  Patient has had 2 hospitalizations last discharge date 12\29\2022.  Patient has had significant and recurrent asthma exacerbation as well as history of CHF, stable CAD and chronic kidney disease.  Type 2 diabetes and essential hypertension.  With report of very low magnesium and significant comorbid conditions, will proceed with diagnostic evaluation for electrolytes, blood counts, BNP and troponin.  By clinical exam and vital signs, no immediate treatments are initiated.  Will await return of electrolytes for determining replacement plan.  We will continue to monitor.  Magnesium returned critically low.  Renal function is stable from baseline.  At this time will initiate mag repletion IV.  Due to profound level with symptoms, patient will require  admission and cardiac monitoring.  Results, medical decision making and plan reviewed with patient and family member.  Consult: Reviewed with Dr. Olevia Bowens for admission. Final  Clinical Impression(s) / ED Diagnoses Final diagnoses:  Hypomagnesemia  General weakness  Wheeze    Rx / DC Orders ED Discharge Orders     None         Charlesetta Shanks, MD 05/13/21 Hawkins, [place], MD 05/13/21 1146

## 2021-05-13 NOTE — Assessment & Plan Note (Addendum)
Presents with complaints of muscle cramps, body aches as well as muscle spasms ongoing for last few days. Symptoms started Wednesday. Lab work at the PCPs office shows hypomagnesemia. Patient received 4 g of IV magnesium in the ER. No event on telemetry. Potassium and magnesium level stable.  We will continue supplements on discharge.

## 2021-05-13 NOTE — Assessment & Plan Note (Signed)
Volume status adequate. Does not appear to be volume overloaded.  Weight actually down 15 pound after discharge from the hospital.

## 2021-05-13 NOTE — Assessment & Plan Note (Signed)
Blood pressure stable. Continue home regimen.

## 2021-05-14 LAB — COMPREHENSIVE METABOLIC PANEL
ALT: 19 U/L (ref 0–44)
AST: 16 U/L (ref 15–41)
Albumin: 3.4 g/dL — ABNORMAL LOW (ref 3.5–5.0)
Alkaline Phosphatase: 53 U/L (ref 38–126)
Anion gap: 8 (ref 5–15)
BUN: 21 mg/dL (ref 8–23)
CO2: 22 mmol/L (ref 22–32)
Calcium: 8.1 mg/dL — ABNORMAL LOW (ref 8.9–10.3)
Chloride: 108 mmol/L (ref 98–111)
Creatinine, Ser: 0.89 mg/dL (ref 0.44–1.00)
GFR, Estimated: >60 mL/min (ref >60)
Glucose, Bld: 255 mg/dL — ABNORMAL HIGH (ref 70–99)
Potassium: 4 mmol/L (ref 3.5–5.1)
Sodium: 138 mmol/L (ref 135–145)
Total Bilirubin: 0.6 mg/dL (ref 0.3–1.2)
Total Protein: 6.3 g/dL — ABNORMAL LOW (ref 6.5–8.1)

## 2021-05-14 LAB — CBC
HCT: 29.5 % — ABNORMAL LOW (ref 36.0–46.0)
Hemoglobin: 9.6 g/dL — ABNORMAL LOW (ref 12.0–15.0)
MCH: 31.6 pg (ref 26.0–34.0)
MCHC: 32.5 g/dL (ref 30.0–36.0)
MCV: 97 fL (ref 80.0–100.0)
Platelets: 175 10*3/uL (ref 150–400)
RBC: 3.04 MIL/uL — ABNORMAL LOW (ref 3.87–5.11)
RDW: 14.8 % (ref 11.5–15.5)
WBC: 8.5 10*3/uL (ref 4.0–10.5)
nRBC: 0 % (ref 0.0–0.2)

## 2021-05-14 LAB — GLUCOSE, CAPILLARY
Glucose-Capillary: 285 mg/dL — ABNORMAL HIGH (ref 70–99)
Glucose-Capillary: 222 mg/dL — ABNORMAL HIGH (ref 70–99)
Glucose-Capillary: 286 mg/dL — ABNORMAL HIGH (ref 70–99)
Glucose-Capillary: 412 mg/dL — ABNORMAL HIGH (ref 70–99)

## 2021-05-14 LAB — IRON AND TIBC
Iron: 50 ug/dL (ref 28–170)
Saturation Ratios: 17 % (ref 10.4–31.8)
TIBC: 288 ug/dL (ref 250–450)
UIBC: 238 ug/dL

## 2021-05-14 LAB — VITAMIN B12: Vitamin B-12: 766 pg/mL (ref 180–914)

## 2021-05-14 LAB — CK: Total CK: 38 U/L (ref 38–234)

## 2021-05-14 LAB — C-REACTIVE PROTEIN: CRP: 5.8 mg/dL — ABNORMAL HIGH (ref <1.0)

## 2021-05-14 LAB — T3 UPTAKE: T3 Uptake Ratio: 32 % (ref 24–39)

## 2021-05-14 LAB — MAGNESIUM: Magnesium: 2.2 mg/dL (ref 1.7–2.4)

## 2021-05-14 LAB — VALPROIC ACID LEVEL: Valproic Acid Lvl: 32 ug/mL — ABNORMAL LOW (ref 50.0–100.0)

## 2021-05-14 LAB — AMMONIA: Ammonia: 34 umol/L (ref 9–35)

## 2021-05-14 MED ORDER — INSULIN ASPART 100 UNIT/ML IJ SOLN
12.0000 [IU] | Freq: Once | INTRAMUSCULAR | Status: AC
  Administered 2021-05-14: 12 [IU] via SUBCUTANEOUS

## 2021-05-14 MED ORDER — IPRATROPIUM-ALBUTEROL 0.5-2.5 (3) MG/3ML IN SOLN
3.0000 mL | Freq: Three times a day (TID) | RESPIRATORY_TRACT | Status: DC
  Filled 2021-05-14: qty 3

## 2021-05-14 MED ORDER — LIP MEDEX EX OINT
1.0000 "application " | TOPICAL_OINTMENT | CUTANEOUS | Status: DC | PRN
  Filled 2021-05-14: qty 7

## 2021-05-14 MED ORDER — POTASSIUM CHLORIDE CRYS ER 20 MEQ PO TBCR: 20.0000 meq | EXTENDED_RELEASE_TABLET | Freq: Every day | ORAL | Status: DC

## 2021-05-14 MED ORDER — CALCIUM CARBONATE ANTACID 500 MG PO CHEW
400.0000 mg | CHEWABLE_TABLET | Freq: Once | ORAL | Status: AC
  Administered 2021-05-14: 400 mg via ORAL
  Filled 2021-05-14: qty 2

## 2021-05-14 MED ORDER — MAGNESIUM OXIDE -MG SUPPLEMENT 400 (240 MG) MG PO TABS: 400.0000 mg | ORAL_TABLET | Freq: Every day | ORAL | 0 refills | Status: AC

## 2021-05-14 MED ORDER — MAGNESIUM OXIDE -MG SUPPLEMENT 400 (240 MG) MG PO TABS
400.0000 mg | ORAL_TABLET | Freq: Every day | ORAL | Status: DC
  Administered 2021-05-14: 400 mg via ORAL
  Filled 2021-05-14: qty 1

## 2021-05-14 MED ORDER — POTASSIUM CHLORIDE CRYS ER 20 MEQ PO TBCR: 20.0000 meq | EXTENDED_RELEASE_TABLET | ORAL | 0 refills | Status: DC

## 2021-05-14 NOTE — Plan of Care (Signed)

## 2021-05-14 NOTE — Evaluation (Signed)
Physical Therapy Evaluation Patient Details Name: [name] MRN: [id number] DOB: [birth year] Today's Date: 05/14/2021  History of Present Illness  Patient admitted 2* abnormal labs at PCP with very low magnesium.   PMH significant of asthma, CAD, CKD 3, CHF, depression, anxiety, diabetes, hypertension, GERD, hyperlipidemia, macrocytic anemia, pulmonary embolism, seizure  Clinical Impression  Pt admitted as above and reporting feeling much better already following Magnesium replenishment.  Pt demonstrates MOD IND in performance of bed mobility and transfers and ambulates in hall IND at decreased pace maintaining Sats above 96% on RA.  Pt states close to baseline and with no skilled PT needs at this time.  PT service will sign off and defer to mobility team.  Pt hopeful for dc home today/tomorrow.     Recommendations for follow up therapy are one component of a multi-disciplinary discharge planning process, led by the attending physician.  Recommendations may be updated based on patient status, additional functional criteria and insurance authorization.  Follow Up Recommendations No PT follow up    Assistance Recommended at Discharge None  Patient can return home with the following       Equipment Recommendations None recommended by PT  Recommendations for Other Services       Functional Status Assessment Patient has had a recent decline in their functional status and demonstrates the ability to make significant improvements in function in a reasonable and predictable amount of time. (but back close to base line following Magnesium replentishment)     Precautions / Restrictions Precautions Precautions: Fall Restrictions Weight Bearing Restrictions: No      Mobility  Bed Mobility Overal bed mobility: Modified Independent             General bed mobility comments: Increased time, pt reports still feels stiff    Transfers Overall transfer level: Modified  independent Equipment used: None               General transfer comment: No physical assist    Ambulation/Gait Ambulation/Gait assistance: Supervision;Independent Gait Distance (Feet): 200 Feet Assistive device: None Gait Pattern/deviations: Step-through pattern;Decreased stride length;Wide base of support Gait velocity: decr     General Gait Details: decreased pace with one short standing rest break 2* mild SOB - 96% sat on RA  Stairs            Wheelchair Mobility    Modified Rankin (Stroke Patients Only)       Balance Overall balance assessment: Mild deficits observed, not formally tested                                           Pertinent Vitals/Pain Pain Assessment: No/denies pain    Home Living Family/patient expects to be discharged to:: Private residence Living Arrangements: Spouse/significant other;Children;Other relatives;Non-relatives/Friends Available Help at Discharge: Family;Available 24 hours/day Type of Home: House Home Access: Stairs to enter Entrance Stairs-Rails: Right;Left;Can reach both Entrance Stairs-Number of Steps: 3   Home Layout: One level Home Equipment: Counsellor (2 wheels) Additional Comments: Lives with husband, grandchildren and great-grandchildren (24 and 75 y.o.)    Prior Function Prior Level of Function : Independent/Modified Independent;Driving             Mobility Comments: no devices needed but admits to furniture walking at times       Hand Dominance   Dominant Hand: Left    Extremity/Trunk Assessment  Upper Extremity Assessment Upper Extremity Assessment: Overall WFL for tasks assessed    Lower Extremity Assessment Lower Extremity Assessment: Overall WFL for tasks assessed    Cervical / Trunk Assessment Cervical / Trunk Assessment: Normal  Communication   Communication: No difficulties  Cognition Arousal/Alertness: Awake/alert Behavior During Therapy: WFL for  tasks assessed/performed Overall Cognitive Status: Within Functional Limits for tasks assessed                                          General Comments      Exercises     Assessment/Plan    PT Assessment Patient does not need any further PT services  PT Problem List         PT Treatment Interventions Gait training    PT Goals (Current goals can be found in the Care Plan section)  Acute Rehab PT Goals Patient Stated Goal: HOME PT Goal Formulation: All assessment and education complete, DC therapy    Frequency       Co-evaluation               AM-PAC PT "6 Clicks" Mobility  Outcome Measure Help needed turning from your back to your side while in a flat bed without using bedrails?: None Help needed moving from lying on your back to sitting on the side of a flat bed without using bedrails?: None Help needed moving to and from a bed to a chair (including a wheelchair)?: None Help needed standing up from a chair using your arms (e.g., wheelchair or bedside chair)?: None Help needed to walk in hospital room?: None Help needed climbing 3-5 steps with a railing? : A Little 6 Click Score: 23    End of Session Equipment Utilized During Treatment: Gait belt Activity Tolerance: Patient tolerated treatment well Patient left: in bed;with call bell/phone within reach Nurse Communication: Mobility status PT Visit Diagnosis: Difficulty in walking, not elsewhere classified (R26.2)    Time: 6144-3154 PT Time Calculation (min) (ACUTE ONLY): 13 min   Charges:   PT Evaluation $PT Eval Low Complexity: [place] Pager [phone] Office [phone]   Emilie Carp 05/14/2021, 4:14 PM

## 2021-05-14 NOTE — Discharge Summary (Signed)
Physician Discharge Summary   Patient: [name] MRN: [id number] DOB: August 22, 1950  Admit date:     05/13/2021  Discharge date: 05/14/21  Discharge Physician: Berle Mull   PCP: [name], MD   Recommendations at discharge:    Pt needs ENT referral  Discharge Diagnoses Principal Problem:   Hypomagnesemia Active Problems:   Chronic diastolic CHF (congestive heart failure) (HCC)   Severe persistent asthma without complication   Chronic kidney disease, stage 3a (HCC)   CAD in native artery   Diabetes mellitus type 2, controlled ([place])   Essential hypertension   G E R D   Depression with anxiety   Hyperlipidemia   Vocal cord dysfunction  Resolved Problems:   * No resolved hospital problems. [facility]   No notes on file  * Hypomagnesemia- (present on admission) Presents with complaints of muscle cramps, body aches as well as muscle spasms ongoing for last few days. Symptoms started Wednesday. Lab work at the PCPs office shows hypomagnesemia. Patient received 4 g of IV magnesium in the ER. No event on telemetry. Potassium and magnesium level stable.  We will continue supplements on discharge.  Severe persistent asthma without complication- (present on admission) Patient denies having any wheezing when she went to her PCPs office. Possibly that her wheezing actually started getting worse on 1/13 only. Continue nebulizer therapy, Brovana and Pulmicort.  As needed DuoNebs.  I do not think the patient requires steroids for now. Monitor.  During last admission steroid-induced severe volume overload.  Chronic diastolic CHF (congestive heart failure) ([place])- (present on admission) Volume status adequate. Does not appear to be volume overloaded.  Weight actually down 15 pound after discharge from the hospital.   Chronic kidney disease, stage 3a ([place])- (present on admission) Renal function stable close to baseline of 1.16. Monitor.  CAD in native artery-  (present on admission) Myoview in 2022 negative. Monitor for now.   Diabetes mellitus type 2, controlled ([place]) Hemoglobin A1c 1.8. On metformin at home  Essential hypertension- (present on admission) Blood pressure stable. Continue home regimen.  G E R D- (present on admission) Continue PPI.  Depression with anxiety- (present on admission) Continue Xanax home regimen.  Hyperlipidemia- (present on admission) On Lipitor at home. Continue.  Vocal cord dysfunction- (present on admission) Suspecting that is responsible for patient's upper airway wheezing. Recommend ENT follow-up and referral outpatient.      Consultants: none Procedures performed: none  Disposition: Home Diet recommendation: Carb modified diet  DISCHARGE MEDICATION: Allergies as of 05/14/2021       Reactions   Chlorthalidone Other (See Comments)   Was stopped due to kidney function   Tramadol Other (See Comments)   Not to be taken because of existing seizure disorder/SEIZURES        Medication List     TAKE these medications    albuterol 108 (90 Base) MCG/ACT inhaler Commonly known as: ProAir HFA Inhale 2 puffs into the lungs every 4 (four) hours as needed for wheezing or shortness of breath.   ALPRAZolam 0.5 MG tablet Commonly known as: XANAX Take 1 tablet (0.5 mg total) by mouth 2 (two) times daily as needed for anxiety. What changed: reasons to take this   atorvastatin 40 MG tablet Commonly known as: LIPITOR Take 40 mg by mouth at bedtime.   Biotin 5000 MCG Caps Take 5,000 mcg by mouth daily.   budesonide 0.5 MG/2ML nebulizer solution Commonly known as: PULMICORT USE 1 VIAL IN NEBULIZER IN  THE MORNING AND AT BEDTIME What changed: See the new instructions.   diltiazem 240 MG 24 hr capsule Commonly known as: CARDIZEM CD Take 1 capsule (240 mg total) by mouth daily.   divalproex 500 MG 24 hr tablet Commonly known as: Depakote ER Take 1 tablet (500 mg total) by mouth at  bedtime.   doxycycline 100 MG tablet Commonly known as: VIBRA-TABS Take 100 mg by mouth 2 (two) times daily.   EQ Probiotic Caps Take 1 capsule by mouth every morning.   esomeprazole 40 MG capsule Commonly known as: NEXIUM Take 40 mg by mouth 2 (two) times daily before a meal.   fluconazole 150 MG tablet Commonly known as: DIFLUCAN Take 150 mg by mouth See admin instructions. Take 150 mg by mouth once as directed and repeat in 3 days, if necessary   fluticasone 50 MCG/ACT nasal spray Commonly known as: FLONASE Place 2 sprays into both nostrils daily as needed for allergies.   formoterol 20 MCG/2ML nebulizer solution Commonly known as: Perforomist USE 1 VIAL  IN  NEBULIZER TWICE  DAILY - morning and evening What changed:  how much to take when to take this additional instructions   hydrALAZINE 100 MG tablet Commonly known as: APRESOLINE Take 1 tablet (100 mg total) by mouth 3 (three) times daily.   ipratropium-albuterol 0.5-2.5 (3) MG/3ML Soln Commonly known as: DUONEB Take 3 mLs by nebulization every 8 (eight) hours as needed (SOB). What changed: reasons to take this   latanoprost 0.005 % ophthalmic solution Commonly known as: XALATAN Place 1 drop into both eyes at bedtime.   magnesium oxide 400 (240 Mg) MG tablet Commonly known as: MAG-OX Take 1 tablet (400 mg total) by mouth daily.   memantine 10 MG tablet Commonly known as: NAMENDA Take 1 tablet (10 mg total) by mouth 2 (two) times daily. What changed: when to take this   METAMUCIL PO Take by mouth See admin instructions. Mix powder as directed and drink once a day as directed for constipation   metFORMIN 500 MG tablet Commonly known as: GLUCOPHAGE Take 500 mg by mouth 2 (two) times daily with a meal.   montelukast 10 MG tablet Commonly known as: SINGULAIR Take 1 tablet (10 mg total) by mouth at bedtime.   multivitamin with minerals Tabs tablet Take 1 tablet by mouth every morning.   nebivolol 10 MG  tablet Commonly known as: BYSTOLIC Take 1 tablet (10 mg total) by mouth daily.   nitroGLYCERIN 0.4 MG SL tablet Commonly known as: NITROSTAT DISSOLVE ONE TABLET UNDER THE TONGUE EVERY 5 MINUTES AS NEEDED FOR CHEST PAIN.  DO NOT EXCEED A TOTAL OF 3 DOSES IN 15 MINUTES What changed: See the new instructions.   potassium chloride SA 20 MEQ tablet Commonly known as: KLOR-CON M Take 1 tablet (20 mEq total) by mouth every other day.        Follow-up Information     [name], MD. Schedule an appointment as soon as possible for a visit in 1 week(s).   Specialty: Family Medicine Contact information: [place] [address] [phone]                 Discharge Exam: Danley Danker Weights   05/13/21 0948  Weight: 78.6 kg   Vitals:   05/14/21 0320 05/14/21 0932 05/14/21 1200 05/14/21 1348  BP: (!) 146/84   135/64  Pulse: 82 100  87  Resp:    18  Temp: 98.3 F (36.8  C)   97.6 F (36.4 C)  TempSrc: Oral   Oral  SpO2: 100% 98% 97% 100%  Weight:      Height:        General: Appear in mild distress, no Rash; Oral Mucosa Clear, moist. no Abnormal Neck Mass Or lumps, Conjunctiva normal  Cardiovascular: S1 and S2 Present, no Murmur, Respiratory: good respiratory effort, Bilateral Air entry present and CTA, no Crackles, Occasional  wheezes Abdomen: Bowel Sound present, Soft and no tenderness Extremities: no Pedal edema Neurology: alert and oriented to time, place, and person affect appropriate. no new focal deficit Gait not checked due to patient safety concerns   Condition at discharge: good  The results of significant diagnostics from this hospitalization (including imaging, microbiology, ancillary and laboratory) are listed below for reference.   Imaging Studies: DG Chest 2 View  Result Date: 05/13/2021 CLINICAL DATA:  Wheezing. EXAM: CHEST - 2 VIEW COMPARISON:  Chest x-ray dated May 03, 2021. FINDINGS: The heart size and mediastinal  contours are within normal limits. Both lungs are clear. The visualized skeletal structures are unremarkable. IMPRESSION: No active cardiopulmonary disease. Electronically Signed   By: Titus Dubin M.D.   On: 05/13/2021 11:09   DG Chest 2 View  Result Date: 05/03/2021 CLINICAL DATA:  Moderate persistent asthma with exacerbation EXAM: CHEST - 2 VIEW COMPARISON:  04/22/2021 FINDINGS: The heart size and mediastinal contours are within normal limits. Both lungs are clear. No pleural effusion or pneumothorax. The visualized skeletal structures are unremarkable. IMPRESSION: No active cardiopulmonary disease. Electronically Signed   By: Macy Mis M.D.   On: 05/03/2021 15:51   DG Chest Portable 1 View  Result Date: 04/22/2021 CLINICAL DATA:  Shortness of breath EXAM: PORTABLE CHEST 1 VIEW COMPARISON:  Chest radiograph dated April 08, 2021 FINDINGS: The heart is normal in size. Low lung volumes with bibasilar atelectasis. No focal consolidation or pleural effusion. The osseous structures are unremarkable. IMPRESSION: Low lung volumes without evidence of focal consolidation or pleural effusion. Bibasilar atelectasis. Electronically Signed   By: Keane Police D.O.   On: 04/22/2021 11:35   VAS [place] LOWER EXTREMITY VENOUS (DVT)  Result Date: 04/28/2021  Lower Venous DVT Study Patient Name:  JIAH BARI  Date of Exam:   04/28/2021 Medical Rec #: [id number]       Accession #:    [id number] Date of Birth: May 21, 1950        Patient Gender: F Patient Age:   54 years Exam Location[facility]  [facility] Procedure:      VAS [place] LOWER EXTREMITY VENOUS (DVT) Referring Phys: Mosella Kasa --------------------------------------------------------------------------------  Indications: Edema, and Pain.  Limitations: Body habitus and poor ultrasound/tissue interface. Comparison Study: 10/07/2017 negative lower extremity venous duplex Performing Technologist: Maudry Mayhew MHA, RDMS, RVT, RDCS  Examination Guidelines: A  complete evaluation includes B-mode imaging, spectral Doppler, color Doppler, and power Doppler as needed of all accessible portions of each vessel. Bilateral testing is considered an integral part of a complete examination. Limited examinations for reoccurring indications may be performed as noted. The reflux portion of the exam is performed with the patient in reverse Trendelenburg.  +---------+---------------+---------+-----------+----------+--------------+  RIGHT     Compressibility Phasicity Spontaneity Properties Thrombus Aging  +---------+---------------+---------+-----------+----------+--------------+  CFV       Full            Yes       Yes                                    +---------+---------------+---------+-----------+----------+--------------+  SFJ       Full                                                             +---------+---------------+---------+-----------+----------+--------------+  FV Prox   Full                                                             +---------+---------------+---------+-----------+----------+--------------+  FV Mid    Full                                                             +---------+---------------+---------+-----------+----------+--------------+  FV Distal Full                                                             +---------+---------------+---------+-----------+----------+--------------+  PFV       Full                                                             +---------+---------------+---------+-----------+----------+--------------+  POP       Full            Yes       Yes                                    +---------+---------------+---------+-----------+----------+--------------+  PTV       Full                                                             +---------+---------------+---------+-----------+----------+--------------+  PERO      Full                                                              +---------+---------------+---------+-----------+----------+--------------+   +---------+---------------+---------+-----------+----------+--------------+  LEFT      Compressibility Phasicity Spontaneity Properties Thrombus Aging  +---------+---------------+---------+-----------+----------+--------------+  CFV       Full            Yes       Yes                                    +---------+---------------+---------+-----------+----------+--------------+  SFJ       Full                                                             +---------+---------------+---------+-----------+----------+--------------+  FV Prox   Full                                                             +---------+---------------+---------+-----------+----------+--------------+  FV Mid    Full                                                             +---------+---------------+---------+-----------+----------+--------------+  FV Distal Full                                                             +---------+---------------+---------+-----------+----------+--------------+  PFV       Full                                                             +---------+---------------+---------+-----------+----------+--------------+  POP       Full            Yes       Yes                                    +---------+---------------+---------+-----------+----------+--------------+  PTV       Full                                                             +---------+---------------+---------+-----------+----------+--------------+  PERO      Full                                                             +---------+---------------+---------+-----------+----------+--------------+     Summary: RIGHT: - There is no evidence of deep vein thrombosis in the lower extremity.  - No cystic structure found in the popliteal fossa.  LEFT: - There is no evidence of deep vein thrombosis in the lower extremity.  - No cystic structure found in the popliteal fossa.   *See table(s)  above for measurements and observations. Electronically signed by Harold Barban MD on 04/28/2021 at 8:01:31 PM.    Final     Microbiology: Results for orders placed or performed during the hospital encounter of 05/13/21  Resp Panel by RT-PCR (Flu A&B, Covid) Nasopharyngeal Swab     Status: None   Collection Time: 05/13/21 11:31 AM   Specimen: Nasopharyngeal Swab; Nasopharyngeal(NP) swabs in vial transport medium  Result Value Ref Range Status   SARS Coronavirus 2 by RT PCR NEGATIVE NEGATIVE Final    Comment: (NOTE) SARS-CoV-2 target nucleic acids are NOT DETECTED.  The SARS-CoV-2 RNA is generally detectable in upper respiratory specimens during the acute phase of infection. The lowest concentration of SARS-CoV-2 viral copies this assay can detect is 138 copies/mL. A negative result does not preclude SARS-Cov-2 infection and should not be used as the sole basis for treatment or other patient management decisions. A negative result may occur with  improper specimen collection/handling, submission of specimen other than nasopharyngeal swab, presence of viral mutation(s) within the areas targeted by this assay, and inadequate number of viral copies(<138 copies/mL). A negative result must be combined with clinical observations, patient history, and epidemiological information. The expected result is Negative.  Fact Sheet for Patients:  EntrepreneurPulse.com.au  Fact Sheet for Healthcare Providers:  IncredibleEmployment.be  This test is no t yet approved or cleared by the [place] FDA and  has been authorized for detection and/or diagnosis of SARS-CoV-2 by FDA under an Emergency Use Authorization (EUA). This EUA will remain  in effect (meaning this test can be used) for the duration of the COVID-19 declaration under Section 564(b)(1) of the Act, 21 U.S.C.section 360bbb-3(b)(1), unless the authorization is terminated  or revoked  sooner.       Influenza A by PCR NEGATIVE NEGATIVE Final   Influenza B by PCR NEGATIVE NEGATIVE Final    Comment: (NOTE) The Xpert Xpress SARS-CoV-2/FLU/RSV plus assay is intended as an aid in the diagnosis of influenza from Nasopharyngeal swab specimens and should not be used as a sole basis for treatment. Nasal washings and aspirates are unacceptable for Xpert Xpress SARS-CoV-2/FLU/RSV testing.  Fact Sheet for Patients: EntrepreneurPulse.com.au  Fact Sheet for Healthcare Providers: IncredibleEmployment.be  This test is not yet approved or cleared by the [place] FDA and has been authorized for detection and/or diagnosis of SARS-CoV-2 by FDA under an Emergency Use Authorization (EUA). This EUA will remain in effect (meaning this test can be used) for the duration of the COVID-19 declaration under Section 564(b)(1) of the Act, 21 U.S.C. section 360bbb-3(b)(1), unless the authorization is terminated or revoked.  Performed at [facility], [address] [place], [place] [address]     Labs: CBC: Recent Labs  Lab 05/13/21 1000 05/14/21 0622  WBC 9.0 8.5  NEUTROABS 6.5  --   HGB 10.1* 9.6*  HCT 30.9* 29.5*  MCV 94.2 97.0  PLT 177 845   Basic Metabolic Panel: Recent Labs  Lab 05/13/21 1000 05/13/21 1809 05/14/21 0622  NA 137 135 138  K 3.6 3.6 4.0  CL 102 104 108  CO2 25 22 22   GLUCOSE 240* 366* 255*  BUN 22 20 21   CREATININE 1.16* 1.12* 0.89  CALCIUM 8.2* 8.3* 8.1*  MG 0.8* 2.4 2.2  PHOS 3.4  --   --    Liver Function Tests: Recent Labs  Lab 05/13/21 1000 05/14/21 0622  AST 16 16  ALT 17 19  ALKPHOS 52 53  BILITOT 0.6 0.6  PROT  6.4* 6.3*  ALBUMIN 3.8 3.4*   CBG: Recent Labs  Lab 05/13/21 1647 05/13/21 2117 05/14/21 0329 05/14/21 0728 05/14/21 1122  GLUCAP 337* 383* 285* 222* 286*    Discharge time spent: greater than 30 minutes.  Signed: Berle Mull, MD [facility] 05/14/2021

## 2021-05-14 NOTE — Progress Notes (Signed)
CBG 412. MD notified. Orders received.

## 2021-05-14 NOTE — Progress Notes (Signed)
Pt discharged home in stable condition. Discharge instructions given. Script sent to pharmacy of choice. No immediate questions or concerns at this time. Discharged from unit via wheelchair.  

## 2021-05-15 ENCOUNTER — Encounter (HOSPITAL_COMMUNITY): Payer: Self-pay | Admitting: Emergency Medicine

## 2021-05-15 ENCOUNTER — Emergency Department (HOSPITAL_COMMUNITY)
Admission: EM | Admit: 2021-05-15 | Discharge: 2021-05-15 | Disposition: A | Discharge status: Home / Self Care | Payer: Medicare Other | Attending: Emergency Medicine | Admitting: Emergency Medicine

## 2021-05-15 DIAGNOSIS — N189 Chronic kidney disease, unspecified: Secondary | ICD-10-CM | POA: Diagnosis not present

## 2021-05-15 DIAGNOSIS — M79605 Pain in left leg: Secondary | ICD-10-CM | POA: Diagnosis not present

## 2021-05-15 DIAGNOSIS — I130 Hypertensive heart and chronic kidney disease with heart failure and stage 1 through stage 4 chronic kidney disease, or unspecified chronic kidney disease: Secondary | ICD-10-CM | POA: Insufficient documentation

## 2021-05-15 DIAGNOSIS — I2510 Atherosclerotic heart disease of native coronary artery without angina pectoris: Secondary | ICD-10-CM | POA: Diagnosis not present

## 2021-05-15 DIAGNOSIS — D631 Anemia in chronic kidney disease: Secondary | ICD-10-CM | POA: Diagnosis not present

## 2021-05-15 DIAGNOSIS — Z79899 Other long term (current) drug therapy: Secondary | ICD-10-CM | POA: Diagnosis not present

## 2021-05-15 DIAGNOSIS — I509 Heart failure, unspecified: Secondary | ICD-10-CM | POA: Insufficient documentation

## 2021-05-15 DIAGNOSIS — M79604 Pain in right leg: Secondary | ICD-10-CM | POA: Diagnosis not present

## 2021-05-15 DIAGNOSIS — E119 Type 2 diabetes mellitus without complications: Secondary | ICD-10-CM | POA: Insufficient documentation

## 2021-05-15 DIAGNOSIS — D649 Anemia, unspecified: Secondary | ICD-10-CM | POA: Insufficient documentation

## 2021-05-15 DIAGNOSIS — Z7984 Long term (current) use of oral hypoglycemic drugs: Secondary | ICD-10-CM | POA: Diagnosis not present

## 2021-05-15 LAB — CBC WITH DIFFERENTIAL/PLATELET
Abs Immature Granulocytes: 0.2 10*3/uL — ABNORMAL HIGH (ref 0.00–0.07)
Basophils Absolute: 0.1 10*3/uL (ref 0.0–0.1)
Basophils Relative: 1 %
Eosinophils Absolute: 0.2 10*3/uL (ref 0.0–0.5)
Eosinophils Relative: 2 %
HCT: 30.2 % — ABNORMAL LOW (ref 36.0–46.0)
Hemoglobin: 9.7 g/dL — ABNORMAL LOW (ref 12.0–15.0)
Immature Granulocytes: 2 %
Lymphocytes Relative: 16 %
Lymphs Abs: 1.5 10*3/uL (ref 0.7–4.0)
MCH: 31.2 pg (ref 26.0–34.0)
MCHC: 32.1 g/dL (ref 30.0–36.0)
MCV: 97.1 fL (ref 80.0–100.0)
Monocytes Absolute: 0.9 10*3/uL (ref 0.1–1.0)
Monocytes Relative: 9 %
Neutro Abs: 6.9 10*3/uL (ref 1.7–7.7)
Neutrophils Relative %: 70 %
Platelets: 196 10*3/uL (ref 150–400)
RBC: 3.11 MIL/uL — ABNORMAL LOW (ref 3.87–5.11)
RDW: 14.8 % (ref 11.5–15.5)
WBC: 9.7 10*3/uL (ref 4.0–10.5)
nRBC: 0 % (ref 0.0–0.2)

## 2021-05-15 LAB — URINALYSIS, ROUTINE W REFLEX MICROSCOPIC
Bacteria, UA: NONE SEEN
Bilirubin Urine: NEGATIVE
Glucose, UA: >=500 mg/dL — AB
Hgb urine dipstick: NEGATIVE
Ketones, ur: NEGATIVE mg/dL
Leukocytes,Ua: NEGATIVE
Nitrite: NEGATIVE
Protein, ur: NEGATIVE mg/dL
Specific Gravity, Urine: 1.023 (ref 1.005–1.030)
pH: 5 (ref 5.0–8.0)

## 2021-05-15 LAB — BASIC METABOLIC PANEL
Anion gap: 6 (ref 5–15)
BUN: 20 mg/dL (ref 8–23)
CO2: 21 mmol/L — ABNORMAL LOW (ref 22–32)
Calcium: 8.6 mg/dL — ABNORMAL LOW (ref 8.9–10.3)
Chloride: 105 mmol/L (ref 98–111)
Creatinine, Ser: 0.85 mg/dL (ref 0.44–1.00)
GFR, Estimated: >60 mL/min (ref >60)
Glucose, Bld: 358 mg/dL — ABNORMAL HIGH (ref 70–99)
Potassium: 4 mmol/L (ref 3.5–5.1)
Sodium: 132 mmol/L — ABNORMAL LOW (ref 135–145)

## 2021-05-15 LAB — CK: Total CK: 38 U/L (ref 38–234)

## 2021-05-15 LAB — MAGNESIUM: Magnesium: 1.5 mg/dL — ABNORMAL LOW (ref 1.7–2.4)

## 2021-05-15 LAB — CBG MONITORING, ED: Glucose-Capillary: 80 mg/dL (ref 70–99)

## 2021-05-15 LAB — VALPROIC ACID LEVEL: Valproic Acid Lvl: 11 ug/mL — ABNORMAL LOW (ref 50.0–100.0)

## 2021-05-15 MED ORDER — INSULIN ASPART 100 UNIT/ML IJ SOLN
8.0000 [IU] | Freq: Once | INTRAMUSCULAR | Status: AC
Start: 2021-05-15 — End: 2021-05-15
  Administered 2021-05-15: 8 [IU] via INTRAVENOUS
  Filled 2021-05-15: qty 0.08

## 2021-05-15 MED ORDER — MAGNESIUM SULFATE 2 GM/50ML IV SOLN
2.0000 g | Freq: Once | INTRAVENOUS | Status: AC
  Administered 2021-05-15: 2 g via INTRAVENOUS
  Filled 2021-05-15: qty 50

## 2021-05-15 MED ORDER — MORPHINE SULFATE (PF) 4 MG/ML IV SOLN
4.0000 mg | Freq: Once | INTRAVENOUS | Status: AC
  Administered 2021-05-15: 4 mg via INTRAVENOUS
  Filled 2021-05-15: qty 1

## 2021-05-15 MED ORDER — IPRATROPIUM-ALBUTEROL 0.5-2.5 (3) MG/3ML IN SOLN
3.0000 mL | Freq: Once | RESPIRATORY_TRACT | Status: AC
  Administered 2021-05-15: 3 mL via RESPIRATORY_TRACT
  Filled 2021-05-15: qty 3

## 2021-05-15 MED ORDER — ONDANSETRON HCL 4 MG/2ML IJ SOLN
4.0000 mg | Freq: Once | INTRAMUSCULAR | Status: AC
  Administered 2021-05-15: 4 mg via INTRAVENOUS
  Filled 2021-05-15: qty 2

## 2021-05-15 MED ORDER — CYCLOBENZAPRINE HCL 10 MG PO TABS: 10.0000 mg | ORAL_TABLET | Freq: Two times a day (BID) | ORAL | 0 refills | Status: DC | PRN

## 2021-05-15 NOTE — ED Provider Notes (Signed)
[facility] DEPT Provider Note   CSN: [id number] Arrival date & time: 05/15/21  1029     History  Chief Complaint  Patient presents with   Leg Pain    [name] is a 71 y.o. female.  Pt is a 71 yo bf with a hx of dm, htn, high cholesterol, cad, chf, ckd, and hypomagnesemia.  Pt was admitted from 1/13-1/14 for low magnesium.  She was having severe cramps in her legs.  She felt better when she was d/c, but she developed severe cramps again last night.  Her daughter gave her a flexeril which helped, but did not make it go away completely.  Cramping is more in the right leg than in the left today.      Home Medications Prior to Admission medications   Medication Sig Start Date End Date Taking? Authorizing Provider  albuterol (PROAIR HFA) 108 (90 Base) MCG/ACT inhaler Inhale 2 puffs into the lungs every 4 (four) hours as needed for wheezing or shortness of breath. 04/20/21   Parrett, Fonnie Mu, NP  ALPRAZolam (XANAX) 0.5 MG tablet Take 1 tablet (0.5 mg total) by mouth 2 (two) times daily as needed for anxiety. Patient taking differently: Take 0.5 mg by mouth 2 (two) times daily as needed for anxiety or sleep. 07/31/15   Theodis Blaze, MD  atorvastatin (LIPITOR) 40 MG tablet Take 40 mg by mouth at bedtime.    [provider]  Biotin 5000 MCG CAPS Take 5,000 mcg by mouth daily.    [provider]  budesonide (PULMICORT) 0.5 MG/2ML nebulizer solution USE 1 VIAL IN NEBULIZER IN THE MORNING AND AT BEDTIME Patient taking differently: Take 0.5 mg by nebulization in the morning and at bedtime. 03/29/21   Tanda Rockers, MD  diltiazem (CARDIZEM CD) 240 MG 24 hr capsule Take 1 capsule (240 mg total) by mouth daily. 03/28/21   Jerline Pain, MD  divalproex (DEPAKOTE ER) 500 MG 24 hr tablet Take 1 tablet (500 mg total) by mouth at bedtime. 03/15/21   Marcial Pacas, MD  esomeprazole (NEXIUM) 40 MG capsule Take 40 mg by mouth 2 (two) times daily before  a meal.    [provider]  fluconazole (DIFLUCAN) 150 MG tablet Take 150 mg by mouth See admin instructions. Take 150 mg by mouth once as directed and repeat in 3 days, if necessary    [provider]  fluticasone (FLONASE) 50 MCG/ACT nasal spray Place 2 sprays into both nostrils daily as needed for allergies.    [provider]  formoterol (PERFOROMIST) 20 MCG/2ML nebulizer solution USE 1 VIAL  IN  NEBULIZER TWICE  DAILY - morning and evening Patient taking differently: 20 mcg 2 (two) times daily. 03/29/21   Tanda Rockers, MD  hydrALAZINE (APRESOLINE) 100 MG tablet Take 1 tablet (100 mg total) by mouth 3 (three) times daily. 04/22/21   Jerline Pain, MD  ipratropium-albuterol (DUONEB) 0.5-2.5 (3) MG/3ML SOLN Take 3 mLs by nebulization every 8 (eight) hours as needed (SOB). Patient taking differently: Take 3 mLs by nebulization every 8 (eight) hours as needed (for shortness of breath). 04/10/21   Ghimire, Henreitta Leber, MD  latanoprost (XALATAN) 0.005 % ophthalmic solution Place 1 drop into both eyes at bedtime. 02/05/20   [provider]  magnesium oxide (MAG-OX) 400 (240 Mg) MG tablet Take 1 tablet (400 mg total) by mouth daily. 05/14/21   Jacqueline Hamman, MD  memantine (NAMENDA) 10 MG tablet Take  1 tablet (10 mg total) by mouth 2 (two) times daily. Patient taking differently: Take 10 mg by mouth in the morning and at bedtime. 03/15/21   Marcial Pacas, MD  metFORMIN (GLUCOPHAGE) 500 MG tablet Take 500 mg by mouth 2 (two) times daily with a meal.    [provider]  montelukast (SINGULAIR) 10 MG tablet Take 1 tablet (10 mg total) by mouth at bedtime. 01/11/21   Tanda Rockers, MD  Multiple Vitamin (MULTIVITAMIN WITH MINERALS) TABS tablet Take 1 tablet by mouth every morning.    [provider]  nebivolol (BYSTOLIC) 10 MG tablet Take 1 tablet (10 mg total) by mouth daily. 04/10/19   Jerline Pain, MD  nitroGLYCERIN (NITROSTAT) 0.4 MG SL tablet  DISSOLVE ONE TABLET UNDER THE TONGUE EVERY 5 MINUTES AS NEEDED FOR CHEST PAIN.  DO NOT EXCEED A TOTAL OF 3 DOSES IN 15 MINUTES Patient taking differently: 0.4 mg every 5 (five) minutes as needed for chest pain. 11/19/19   Burtis Junes, NP  potassium chloride SA (KLOR-CON M) 20 MEQ tablet Take 1 tablet (20 mEq total) by mouth every other day. 05/14/21   Jacqueline Hamman, MD  Probiotic Product (EQ PROBIOTIC) CAPS Take 1 capsule by mouth every morning.    [provider]  Psyllium (METAMUCIL PO) Take by mouth See admin instructions. Mix powder as directed and drink once a day as directed for constipation    [provider]      Allergies    Chlorthalidone and Tramadol    Review of Systems   Review of Systems  Musculoskeletal:        Leg cramps  All other systems reviewed and are negative.  Physical Exam Updated Vital Signs BP (!) 144/69    Pulse 72    Temp 98.3 F (36.8 C) (Oral)    Resp 17    LMP  (LMP Unknown)    SpO2 99%  Physical Exam Vitals and nursing note reviewed.  Constitutional:      Appearance: Normal appearance.  HENT:     Head: Normocephalic and atraumatic.     Right Ear: External ear normal.     Left Ear: External ear normal.     Nose: Nose normal.     Mouth/Throat:     Mouth: Mucous membranes are moist.     Pharynx: Oropharynx is clear.  Eyes:     Extraocular Movements: Extraocular movements intact.     Conjunctiva/sclera: Conjunctivae normal.     Pupils: Pupils are equal, round, and reactive to light.  Cardiovascular:     Rate and Rhythm: Normal rate and regular rhythm.     Pulses: Normal pulses.     Heart sounds: Normal heart sounds.  Pulmonary:     Effort: Pulmonary effort is normal.     Breath sounds: Normal breath sounds.  Abdominal:     General: Abdomen is flat. Bowel sounds are normal.     Palpations: Abdomen is soft.  Musculoskeletal:        General: Normal range of motion.     Cervical back: Normal range of motion and neck  supple.  Skin:    General: Skin is warm.     Capillary Refill: Capillary refill takes less than 2 seconds.  Neurological:     General: No focal deficit present.     Mental Status: She is alert and oriented to person, place, and time.  Psychiatric:        Mood and Affect:  Mood normal.        Behavior: Behavior normal.    ED Results / Procedures / Treatments   Labs (all labs ordered are listed, but only abnormal results are displayed) Labs Reviewed  BASIC METABOLIC PANEL - Abnormal; Notable for the following components:      Result Value   Sodium 132 (*)    CO2 21 (*)    Glucose, Bld 358 (*)    Calcium 8.6 (*)    All other components within normal limits  CBC WITH DIFFERENTIAL/PLATELET - Abnormal; Notable for the following components:   RBC 3.11 (*)    Hemoglobin 9.7 (*)    HCT 30.2 (*)    Abs Immature Granulocytes 0.20 (*)    All other components within normal limits  URINALYSIS, ROUTINE W REFLEX MICROSCOPIC - Abnormal; Notable for the following components:   Glucose, UA >=500 (*)    All other components within normal limits  MAGNESIUM - Abnormal; Notable for the following components:   Magnesium 1.5 (*)    All other components within normal limits  VALPROIC ACID LEVEL - Abnormal; Notable for the following components:   Valproic Acid Lvl 11 (*)    All other components within normal limits  CK  CBG MONITORING, ED    EKG None  Radiology No results found.  Procedures Procedures    Medications Ordered in ED Medications  morphine 4 MG/ML injection 4 mg (4 mg Intravenous Given 05/15/21 1206)  ondansetron (ZOFRAN) injection 4 mg (4 mg Intravenous Given 05/15/21 1206)  ipratropium-albuterol (DUONEB) 0.5-2.5 (3) MG/3ML nebulizer solution 3 mL (3 mLs Nebulization Given 05/15/21 1213)  magnesium sulfate IVPB 2 g 50 mL (2 g Intravenous New Bag/Given 05/15/21 1330)  insulin aspart (novoLOG) injection 8 Units (8 Units Intravenous Given 05/15/21 1337)    ED Course/ Medical  Decision Making/ A&P                           Medical Decision Making  Pt's labs reviewed.  Mg is 1.5.  Hemoglobin is 9.7 which is chronic for patient.  Pt's bs is 358.  BS has been elevated for several days.  Pt may be losing magnesium due to increased urination from hyperglycemia.  BS down to 80 after IV insulin.  Pt is feeling better.  She is given a list of foods high in magnesium.  She is instructed to take her Mg and K.  She is stable for d/c.  She is to return if worse.  F/u with pcp.        Final Clinical Impression(s) / ED Diagnoses Final diagnoses:  Hypomagnesemia  Chronic anemia    Rx / DC Orders ED Discharge Orders     None         Isla Pence, MD 05/15/21 1433

## 2021-05-15 NOTE — ED Triage Notes (Addendum)
Patient reports cramping in R leg. D/c last night after admission for low mag. States PCP recommended patient return for mag recheck. Reports taking flexeril this morning with some relief.

## 2021-05-17 ENCOUNTER — Other Ambulatory Visit: Payer: Self-pay

## 2021-05-17 ENCOUNTER — Ambulatory Visit (INDEPENDENT_AMBULATORY_CARE_PROVIDER_SITE_OTHER): Payer: Medicare Other | Admitting: Internal Medicine

## 2021-05-17 ENCOUNTER — Encounter: Payer: Medicare Other | Admitting: Physical Therapy

## 2021-05-17 ENCOUNTER — Encounter: Payer: Self-pay | Admitting: Internal Medicine

## 2021-05-17 DIAGNOSIS — J4551 Severe persistent asthma with (acute) exacerbation: Secondary | ICD-10-CM

## 2021-05-17 MED ORDER — PREDNISONE 10 MG PO TABS: ORAL_TABLET | ORAL | 11 refills | Status: DC

## 2021-05-17 MED ORDER — ALBUTEROL SULFATE (2.5 MG/3ML) 0.083% IN NEBU: 2.5000 mg | INHALATION_SOLUTION | RESPIRATORY_TRACT | 12 refills | Status: DC | PRN

## 2021-05-17 NOTE — Progress Notes (Signed)
Subjective:   Patient ID: [name], female    DOB: 1950/06/10    MRN: [id number]    Brief patient profile:  71   yobf quit smoking 2005 with nl pfts 2012  with recurrent non-specific resp flares with nl pfts during flares c/w pseudoasthma.     History of Present Illness  01/24/2016  Extended post hosp f/u ov/ transition of care/ /Stephaney Steven re: dtca asthma/ vcd symb 160 and pred 5 x 10 mg daily  Chief Complaint  Patient presents with   Follow-up    Pt. recently got out the [facility] for an asthma attack, Pt. states her breathing remains the same, coughing with some yellow to white mucus,Has been needing to use Ventolin more often  last doing well 2 months prior to OV   While on Symb 160/ gerd rx but not dosing ac as rec  Waking up prematurely x 2 months with cough/wheeze/ sob but never contacted me as per the instructions rec Plan A = Automatic = dulera 100 Take 2 puffs first thing in am and then another 2 puffs about 12 hours later.                                      Nexum 40 mg Take 30-60 min before first meal of the day and Pepcid AC 20 mg  at bedtime Work on inhaler technique  Plan B = Backup Only use your albuterol as a rescue medication Plan C = Crisis - only use your albuterol nebulizer if you first try Plan B and it fails to help > ok to use the nebulizer up to every 4 hours but if start needing it regularly call for immediate appointment GERD diet  Take delsym two tsp every 12 hours and use the flutter valve as much as possible and supplement if needed with  tramadol 50 mg up to 2 every 4 hours to suppress the urge to cough  Once you have eliminated the cough for 3 straight days try reducing the tramadol first,  then the delsym as tolerated.   Prednisone 5 mg x 8, then 6, then 4 then 2, then 1 daily x 2 days and off  please schedule a follow up office visit in 2 weeks, sooner if needed with all meds/ inhalers/ neb solutions in hand     09/18/2016  f/u ov/Deny Chevez re:  UACS vs  Asthma brought med calendar but not updated / on symb 160 / max gerd rx  Chief Complaint  Patient presents with   Follow-up    Pt c/o occ wheezing. She has not used rescue inhaler or neb recently.   still feels wheezing  And doe = MMRC1 = can walk nl pace, flat grade, can't hurry or go uphills or steps s sob rec Change the symbicort to 80 Take 2 puffs first thing in am and then another 2 puffs about 12 hours later.  Work on inhaler technique:  Use the med calendar daily to organize your medications as a "checklist" See [name] NP in 4  weeks with all your medications> never happened    Admit date: 12/19/2017 Discharge date: 12/24/2017    D/c home with steroids taper, monitor blood glucose while on steroids She reports run out of pulmicort nebs, new prescription provided, she is to follow up with Dr Melvyn Novas to further discuss asthma/copd meds, she is concerned about cost of these  meds   Discharge Diagnoses:      Active Hospital Problems    Diagnosis Date Noted   Acute bronchitis with asthma with acute exacerbation 01/16/2016   Acute renal failure superimposed on stage 3 chronic kidney disease (HCC) 07/17/2016   Chronic diastolic CHF (congestive heart failure) ([place]) 07/17/2016   Acute respiratory failure with hypoxia (HCC)     Upper airway cough syndrome vs Asthma  08/28/2014   Diabetes mellitus type 2, controlled ([place]) 08/06/2014   Essential hypertension 07/21/2008            12/28/2017 ext post hosp f/u ov/Jacquetta Polhamus re: confused with meds again / only slightly better breathing  Chief Complaint  Patient presents with   Follow-up    Breathing has improved slightly. She has been out of brovana due to high cost x 3 days. She has had to use her albuterol inhaler and neb daily since then.   Dyspnea:  50 ft = About the same as usual Cough: after supper is the worse, but not much mucus Sleeping: wakes up coughing 30 degrees due to sensation of reflux    SABA use: last 5 h prior to OV   02:  none  Sleeping at 30 degrees    rec Plan A = Automatic = Performist 20 mcg one vial  with pulmocort one half vial twice daily  Gabapentin 100 mg three times daily Nexium Take 30- 60 min before your first and last meals of the day and zantac (ranitidine) at bedtime  Along with chlorpheniramine 4 mg 1 or 2 also at bedtime (over the counter)  Continue prednisone 10 mg every day  Plan B = Backup for breathing Only use your albuterol as a rescue medication  Plan B = Backup for coughing  Add tramadol 50 mg 1-2 every 4 hours if needed  Plan C = Crisis - only use your albuterol nebulizer if you first try Plan B and it fails to help > ok to use the nebulizer up to every 4 hours but if start needing it regularly call for immediate appointment     03/11/2018  f/u ov/Theoden Mauch re: pseudoasthma > asthma Chief Complaint  Patient presents with   Follow-up    Cough had improved and then started back 1 day ago.  Cough is now non prod.  She is using her albuterol inhaler 2 x daily on average. She has not needed albuterol neb.    Dyspnea:  MMRC2 = can't walk a nl pace on a flat grade s sob but does fine slow and flat slowed by knee  Cough: noct > day  Sleeping: 45 degrees due to subjective reflux despite max acid suppresion  SABA use: as above - never noct 02: none   rec Gabapentin 100 mg four  times daily  Take chortab 4 mg x 2 at bedtime and up to every 4 hours during the day as needed for drainage  Take delsym two tsp every 12 hours and supplement if needed with  tramadol 50 mg up to 2 every 4 hours to suppress the urge to cough. Swallowing water and/or using ice chips/non mint and menthol containing candies (such as lifesavers or sugarless jolly ranchers) are also effective.  You should rest your voice and avoid activities that you know make you cough. Once you have eliminated the cough for 3 straight days try reducing the tramadol first,  then the delsym as tolerated.  Plan D = Prednisone x 6 days if  not cough/ sob  not better See calendar for specific medication instructions and bring it back for each and every office visit for every healthcare provider you see.  Without it,  you may not receive the best quality medical care that we feel you deserve. Please schedule a follow up office visit in 4 weeks, call sooner if needed with all medications /inhalers/ solutions in hand so we can verify exactly what you are taking. This includes all medications from all doctors and over the [place] separate them into two bags:  the ones you take automatically, no matter what, vs the ones you take just when you feel you need them "BAG #2 is UP TO YOU"  - this will really help [place] help you take your medications more effectively.     03/15/18 back to ER - did not activate action plan prior (never took more than 1 tramadol every 12 hours to control cough.   03/20/2018  Extended post hops  f/u ov/Corretta Munce re: transition of care re   vcd vs asthma  Chief Complaint  Patient presents with   Follow-up    recent admission 03/15/18- pt reports of wheezing, sob with exertion, no prod cough & chest tightness.   Dyspnea:  MMRC4  = sob if tries to leave home or while getting dressed   - no better now vs during hosp stay. Seen by Dr [place] Bells who strongly supported vcd dx during admit Cough: dry cough settles down overnight and with plb as does the "wheeze"  Sleeping: 45 degrees SABA use: last neb 8 h prior to OV  "did not help" 02: no  rec Increase gabapentin to 100 mg take 2 (=200)  four times daily as per med calendar For cough > delsym 2 tsp every 12 hours with use of flutter valve as much as possible  and supplement with tramadol 50 mg up to 1-2 every 4 hours as needed  For breathing problems not improving with use of nebulizer > Prednisone 10 mg take  4 each am x 2 days,   2 each am x 2 days,  1 each am x 2 days and stop  GERD diet / lifestyle recs Keep previous appt with your medications in 2 bags     02/04/19 televisit Stop gabapentin completely since you are only taking one at bedtime but add it back if start coughing again for any reason and ok to build up to 4 x daily  If your breathing gets worse, increase the Performist to 20 mcg (one vial) every 12 hours instead of just once a day.    01/12/2020  f/u ov/Latayna Ritchie re:   Asthma with uacs / continues to struggle with concept of med reconciliation Chief Complaint  Patient presents with   Follow-up    "feeling good"  Dyspnea:  Walking neighborhood, chasing kids at age 77 and 48 Cough: none / still hoarse and clearing throat daytime  Sleeping : no resp symptoms  02 none  rec Plan A = Automatic = Always=    Performist and the budesonide twice daily  And continue esmeprazole 40 mg Take 30-60 min before first meal of the day and 20 mg Take 30-60 min before last meal of the day  Plan B = Backup (to supplement plan A, not to replace it) Only use your albuterol inhaler as a rescue medication Plan C = Crisis (instead of Plan B but only if Plan B stops working) - only use your albuterol nebulizer if you first try Plan  B  Plan D = Deltasone if ABC not working  - Prednisone 10 mg take  4 each am x 2 days,   2 each am x 2 days,  1 each am x 2 days and stop     09/08/2020  f/u ov/Mikail Goostree re: AB/ uacs much better on performist/bud with still poor hfa  Chief Complaint  Patient presents with   Follow-up    Pt states she has been doing well since last visit. States her cough is gone and denies any current complaints.   Dyspnea:  No limits  Cough: none  Sleeping: ok flat bed/ 2 pillows  SABA use: none  02: none Covid status:   vax x 3  Rec Ok to reduce your perforomist / budesonide once daily as long as doing great - otherwise twice daily  Esomeprazole is Take 30- 60 min before your first and last meals of the day     03/15/2021  f/u ov/Taiga Lupinacci re: AB  maint on perfomist/bud/ singulair each am   Chief Complaint  Patient presents with    Follow-up    Lumpectomy 9/22.  Breathing is ok.  Dyspnea:  Not limited by breathing from desired activities  daily walks thru neighborhood x 30 min some hills Cough: none  Sleeping: on either side flat bed 2 pillows  SABA use: with cold  02: none  Covid status:   vax x 4   Rec Admit date:     05/13/2021  Discharge date: 05/14/21    PCP: [name], MD     Pt needs ENT referral   Discharge Diagnoses Principal Problem:   Hypomagnesemia   Chronic diastolic CHF (congestive heart failure) (HCC)   Severe persistent asthma without complication   Chronic kidney disease, stage 3a ([place])   CAD in native artery   Diabetes mellitus type 2, controlled ([place])   Essential hypertension   G E R D   Depression with anxiety   Hyperlipidemia   Vocal cord dysfunction   Hospital Course    * Hypomagnesemia- (present on admission) Presents with complaints of muscle cramps, body aches as well as muscle spasms  x few days.PTA Lab work at the PCPs office showed hypomagnesemia. Patient received 4 g of IV magnesium in the ER. No event on telemetry. Potassium and magnesium level stable.  We will continue supplements on discharge.   Severe persistent asthma without complication- (present on admission) Patient denies having any wheezing when she went to her PCPs office. Possibly that her wheezing actually started getting worse on 1/13 only. Continue nebulizer therapy, Brovana and Pulmicort.  As needed DuoNebs.  I do not think the patient requires steroids for now.     Chronic diastolic CHF (congestive heart failure) ([place])- (present on admission) Volume status adequate. Does not appear to be volume overloaded.  Weight actually down 15 pound after discharge from the hospital.     Chronic kidney disease, stage 3a ([place])- (present on admission) Renal function stable close to baseline of 1.16. Monitor.   CAD in native artery- (present on admission) Myoview in 2022 negative. Monitor for now.      Diabetes mellitus type 2, controlled ([place]) Hemoglobin A1c 1.8. On metformin at home   Essential hypertension- (present on admission) Blood pressure stable. Continue home regimen.   G E R D- (present on admission) Continue PPI.   Depression with anxiety- (present on admission) Continue Xanax home regimen.   Hyperlipidemia- (present on admission) On Lipitor at home. Continue.   Vocal  cord dysfunction- (present on admission) Suspecting that is responsible for patient's upper airway wheezing. Recommend ENT follow-up and referral outpatient.       05/17/2021 post hosp  f/u ov/Teofila Bowery re: asthma/ vcd   maint on yup/performist/ singulair   Chief Complaint  Patient presents with   Follow-up    She states hospitalized in Dec 2022 with asthma flare- breathing has not returned to baseline but is some better. She uses her albuterol inhaler approx 4 x per wk and rarely uses duoneb.   Dyspnea:  50 ft then sob  Cough: gone / never had st but hoarseness better  Sleeping: waking up due to cough/ flat bed 2 pillows  SABA use: hfa not neb        No obvious day to day or daytime variability or assoc excess/ purulent sputum or mucus plugs or hemoptysis or cp or chest tightness, subjective wheeze or overt sinus or hb symptoms.   Sleeping  without nocturnal  or early am exacerbation  of respiratory  c/o's or need for noct saba. Also denies any obvious fluctuation of symptoms with weather or environmental changes or other aggravating or alleviating factors except as outlined above   No unusual exposure hx or h/o childhood pna/ asthma or knowledge of premature birth.  Current Allergies, Complete Past Medical History, Past Surgical History, Family History, and Social History were reviewed in [employer] record.  ROS  The following are not active complaints unless bolded Hoarseness, sore throat, dysphagia, dental problems, itching, sneezing,  nasal congestion or discharge of  excess mucus or purulent secretions, ear ache,   fever, chills, sweats, unintended wt loss or wt gain, classically pleuritic or exertional cp,  orthopnea pnd or arm/hand swelling  or leg swelling, presyncope, palpitations, abdominal pain, anorexia, nausea, vomiting, diarrhea  or change in bowel habits or change in bladder habits, change in stools or change in urine, dysuria, hematuria,  rash, arthralgias, visual complaints, headache, numbness, weakness or ataxia or problems with walking or coordination,  change in mood or  memory.        Current Meds  Medication Sig   albuterol (PROAIR HFA) 108 (90 Base) MCG/ACT inhaler Inhale 2 puffs into the lungs every 4 (four) hours as needed for wheezing or shortness of breath.   ALPRAZolam (XANAX) 0.5 MG tablet Take 1 tablet (0.5 mg total) by mouth 2 (two) times daily as needed for anxiety. (Patient taking differently: Take 0.5 mg by mouth 2 (two) times daily as needed for anxiety or sleep.)   atorvastatin (LIPITOR) 40 MG tablet Take 40 mg by mouth at bedtime.   Biotin 5000 MCG CAPS Take 5,000 mcg by mouth daily.   budesonide (PULMICORT) 0.5 MG/2ML nebulizer solution USE 1 VIAL IN NEBULIZER IN THE MORNING AND AT BEDTIME (Patient taking differently: Take 0.5 mg by nebulization in the morning and at bedtime.)   cyclobenzaprine (FLEXERIL) 10 MG tablet Take 1 tablet (10 mg total) by mouth 2 (two) times daily as needed for muscle spasms.   diltiazem (CARDIZEM CD) 240 MG 24 hr capsule Take 1 capsule (240 mg total) by mouth daily.   divalproex (DEPAKOTE ER) 500 MG 24 hr tablet Take 1 tablet (500 mg total) by mouth at bedtime.   esomeprazole (NEXIUM) 40 MG capsule Take 40 mg by mouth 2 (two) times daily before a meal.   fluconazole (DIFLUCAN) 150 MG tablet Take 150 mg by mouth See admin instructions. Take 150 mg by mouth once as directed and repeat  in 3 days, if necessary   fluticasone (FLONASE) 50 MCG/ACT nasal spray Place 2 sprays into both nostrils daily as needed  for allergies.   formoterol (PERFOROMIST) 20 MCG/2ML nebulizer solution USE 1 VIAL  IN  NEBULIZER TWICE  DAILY - morning and evening (Patient taking differently: 20 mcg 2 (two) times daily.)   hydrALAZINE (APRESOLINE) 100 MG tablet Take 1 tablet (100 mg total) by mouth 3 (three) times daily.   ipratropium-albuterol (DUONEB) 0.5-2.5 (3) MG/3ML SOLN Take 3 mLs by nebulization every 8 (eight) hours as needed (SOB). (Patient taking differently: Take 3 mLs by nebulization every 8 (eight) hours as needed (for shortness of breath).)   latanoprost (XALATAN) 0.005 % ophthalmic solution Place 1 drop into both eyes at bedtime.   magnesium oxide (MAG-OX) 400 (240 Mg) MG tablet Take 1 tablet (400 mg total) by mouth daily.   memantine (NAMENDA) 10 MG tablet Take 1 tablet (10 mg total) by mouth 2 (two) times daily. (Patient taking differently: Take 10 mg by mouth in the morning and at bedtime.)   metFORMIN (GLUCOPHAGE) 500 MG tablet Take 500 mg by mouth 2 (two) times daily with a meal.   montelukast (SINGULAIR) 10 MG tablet Take 1 tablet (10 mg total) by mouth at bedtime.   Multiple Vitamin (MULTIVITAMIN WITH MINERALS) TABS tablet Take 1 tablet by mouth every morning.   nebivolol (BYSTOLIC) 10 MG tablet Take 1 tablet (10 mg total) by mouth daily.   nitroGLYCERIN (NITROSTAT) 0.4 MG SL tablet DISSOLVE ONE TABLET UNDER THE TONGUE EVERY 5 MINUTES AS NEEDED FOR CHEST PAIN.  DO NOT EXCEED A TOTAL OF 3 DOSES IN 15 MINUTES (Patient taking differently: 0.4 mg every 5 (five) minutes as needed for chest pain.)   potassium chloride SA (KLOR-CON M) 20 MEQ tablet Take 1 tablet (20 mEq total) by mouth every other day.   Probiotic Product (EQ PROBIOTIC) CAPS Take 1 capsule by mouth every morning.   Psyllium (METAMUCIL PO) Take by mouth See admin instructions. Mix powder as directed and drink once a day as directed for constipation                         Objective:   Physical Exam  05/17/2021       165 03/15/2021     161  09/08/2020      153 01/12/2020      150  09/09/2019     147  03/12/2019   157  11/04/2018       157  02/23/2016   168 >  04/07/2016  168 > 05/08/2016    168 >  07/17/2016  162 > 09/18/2016    164  > 09/18/2017  160 > 12/28/2017 167  > 03/11/2018  164 > 03/20/2018   160 > 04/05/2018   171 > 04/18/2018  165     Vital signs reviewed  05/17/2021  - Note at rest 02 sats  99% on RA   General appearance:    amb bf / mild pseudowheeze    HEENT : pt wearing mask not removed for exam due to covid -19 concerns.    NECK :  without JVD/Nodes/TM/ nl carotid upstrokes bilaterally   LUNGS: no acc muscle use,  Nl contour chest with slt distant  bilaterally without cough on insp or exp maneuvers   CV:  RRR  no s3 or murmur or increase in P2, and no edema   ABD:  soft and nontender with nl  inspiratory excursion in the supine position. No bruits or organomegaly appreciated, bowel sounds nl  MS:  Nl gait/ ext warm without deformities, calf tenderness, cyanosis or clubbing No obvious joint restrictions   SKIN: warm and dry without lesions    NEURO:  alert, approp, nl sensorium with  no motor or cerebellar deficits apparent.

## 2021-05-17 NOTE — Assessment & Plan Note (Addendum)
Onset of symptoms reported  2012 p quit smoking  2005  - FEN0 01/24/2016 = 13 with active symptoms on symb 160 2bid > changed to dulera 100  - Spirometry 02/23/2016  NO signifiant obstruction with active symptoms  - 02/23/2016  After extensive coaching HFA effectiveness =    75%  - FENO 02/23/2016  =  12   On dulera 100 2bid (unable to confirm adeherence as did not bring it - Singulair 10 mg daily added 02/23/2016 to dulera 100 2bid with 2 week sample only  (unable to verify she used it) - Allergy profile 02/23/16  >  Eos 0.0 /  IgE  87 pos  RAST  Grass/ trees  - 04/07/2016   symb 80 x one sample given - FENO 04/07/2016  =   43 ? Really on meds ?  - 05/08/2016  After extensive coaching HFA effectiveness =    75% > continue symb 80 2bid - 07/17/2016 added singulair maint   - FENO 09/18/2016  =   15 on symb 160 2bid > try 80 2bid  - flare late feb 2019 ? What maint rx she was taking but on admit "non-adherent per notes 08/12/17  - 09/18/2017  After extensive coaching inhaler device  effectiveness =    75% from a baseline of 50% > restart symb 80 2bid - FENO 09/18/2017  =   20 on symb 160  - Spirometry 09/18/2017  No obstruction at all with active wheeze -  09/09/2019 added prednisone as "plan D" x 6 day cycles  - 05/17/2021  After extensive coaching inhaler device,  effectiveness =    75% from a baseline of 50%    Flare in setting of not using ppi ac bid and not using prednisone as Plan D so reviewed again the ABCD plan in writing and emphasized difference between maint and prn meds   F/u in 6 weeks, sooner if needed         Each maintenance medication was reviewed in detail including emphasizing most importantly the difference between maintenance and prns and under what circumstances the prns are to be triggered using an action plan format where appropriate.  Total time for H and P, chart review, counseling, reviewing neb/hfa device(s) and generating customized AVS unique to this post hosp office  visit / same day charting  > 30 min

## 2021-05-17 NOTE — Patient Instructions (Addendum)
Bed blocks (risers) 6-8 in under head of bed  Only use your albuterol as a rescue medication to be used if you can't catch your breath by resting or doing a relaxed purse lip breathing pattern.  - The less you use it, the better it will work when you need it. - Ok to use up to 2 puffs  every 4 hours if you must but call for immediate appointment if use goes up over your usual need - Don't leave home without it !!  (think of it like the spare tire for your car)   Work on inhaler technique:  relax and gently blow all the way out then take a nice smooth full deep breath back in, triggering the inhaler at same time you start breathing in.  Hold for up to 5 seconds if you can.  Rinse and gargle with water when done.  If mouth or throat bother you at all,  try brushing teeth/gums/tongue with arm and hammer toothpaste/ make a slurry and gargle and spit out.      If not better change to over albuterol (not duoneb) nebulizer up to every 4 hours as needed   and go ahead and take Prednisone 10 mg take  4 each am x 2 days,   2 each am x 2 days,  1 each am x 2 days and stop (refillable)   Change nexium 40 mg Take 30- 60 min before your first and last meals of the day   Please schedule a follow up office visit in 6 weeks, call sooner if needed

## 2021-05-22 ENCOUNTER — Other Ambulatory Visit: Payer: Self-pay | Admitting: Cardiology

## 2021-05-25 ENCOUNTER — Other Ambulatory Visit: Payer: Self-pay

## 2021-05-25 ENCOUNTER — Telehealth: Payer: Self-pay | Admitting: Internal Medicine

## 2021-05-25 DIAGNOSIS — J4550 Severe persistent asthma, uncomplicated: Secondary | ICD-10-CM

## 2021-05-25 DIAGNOSIS — J301 Allergic rhinitis due to pollen: Secondary | ICD-10-CM

## 2021-05-25 MED ORDER — MONTELUKAST SODIUM 10 MG PO TABS: 10.0000 mg | ORAL_TABLET | Freq: Every day | ORAL | 6 refills | Status: DC

## 2021-05-25 MED ORDER — MONTELUKAST SODIUM 10 MG PO TABS: 10.0000 mg | ORAL_TABLET | Freq: Every day | ORAL | 3 refills | Status: DC

## 2021-05-25 NOTE — Telephone Encounter (Signed)
Rx for pt's montelukast has been sent to preferred pharmacy for pt. Attempted to call pt to let her know this had been done but unable to reach. Left a detailed message letting pt know this was done. Nothing further needed.

## 2021-05-25 NOTE — Telephone Encounter (Signed)
Fax request from [facility] to have Singulair e-prescribed to them. Done. Nothing further needed.

## 2021-05-27 ENCOUNTER — Encounter: Payer: Self-pay | Admitting: Physical Therapy

## 2021-05-27 ENCOUNTER — Ambulatory Visit: Payer: Medicare Other | Admitting: Physical Therapy

## 2021-05-27 ENCOUNTER — Other Ambulatory Visit: Payer: Self-pay

## 2021-05-27 DIAGNOSIS — M6281 Muscle weakness (generalized): Secondary | ICD-10-CM

## 2021-05-27 DIAGNOSIS — R293 Abnormal posture: Secondary | ICD-10-CM

## 2021-05-27 NOTE — Patient Instructions (Signed)
Access Code: [email or web address] URL: [email or web address] Date: 05/27/2021 Prepared by: Jari Favre  Exercises Supine Lower Trunk Rotation - 1 x daily - 7 x weekly - 1 sets - 10 reps - 10 sec hold Seated Flexion Stretch - 1 x daily - 7 x weekly - 3 sets - 10 reps Standing Sidebends - 1 x daily - 7 x weekly - 3 sets - 10 reps Seated Thoracic Flexion and Rotation with Arms Crossed - 1 x daily - 7 x weekly - 1 sets - 10 reps - 10 sec hold Seated Gentle Upper Trapezius Stretch - 1 x daily - 7 x weekly - 3 reps - 1 sets - 30 sec hold

## 2021-05-27 NOTE — Therapy (Signed)
[place] @ [place] [place], [place], [address] Phone: [phone]   Fax:  [phone]  Physical Therapy Treatment  Patient Details  Name: [name] MRN: [id number] Date of Birth: 10/02/1950 Referring Provider (PT): Garnette Scheuermann, [place]   Encounter Date: 05/27/2021   PT End of Session - 05/27/21 1058     Visit Number 3    Date for PT Re-Evaluation 06/22/21    Authorization Type UHC    PT Start Time 1016    PT Stop Time 1059    PT Time Calculation (min) 43 min    Activity Tolerance Patient tolerated treatment well    Behavior During Therapy [facility] for tasks assessed/performed             Past Medical History:  Diagnosis Date   Anxiety    Arthritis    Asthma    Chronic diastolic CHF (congestive heart failure) ([place]) 07/17/2016   Coronary artery disease    a. NSTEMI 12/2015 - LHC 01/18/16: s/p overlapping DESx2 to RCA, 10% ost-prox Cx,10% mLAD.   Dementia ([place])    Depression    Diabetes mellitus    Dyslipidemia    GERD (gastroesophageal reflux disease)    History of seizure    Hypertension    Hypertensive heart disease    NSTEMI (non-ST elevated myocardial infarction) ([place]) 12/2015   Stage 3 chronic kidney disease (HCC)     Past Surgical History:  Procedure Laterality Date   ABDOMINAL HYSTERECTOMY     CARDIAC CATHETERIZATION  1994   minimal LAD dz, no other dz, EF normal   CARDIAC CATHETERIZATION N/A 01/18/2016   Procedure: Left Heart Cath and Coronary Angiography;  Surgeon: Burnell Blanks, MD;  Location: [facility] [facility];  Service: Cardiovascular;  Laterality: N/A;   CARDIAC CATHETERIZATION N/A 01/18/2016   Procedure: Coronary Stent Intervention;  Surgeon: Burnell Blanks, MD;  Location: [facility];  Service: Cardiovascular;  Laterality: N/A;   COLONOSCOPY WITH PROPOFOL N/A 04/09/2014   Procedure: COLONOSCOPY WITH PROPOFOL;  Surgeon: Cleotis Nipper, MD;  [facility]: [facility];  Service: Endoscopy;  Laterality: N/A;   CORONARY STENT PLACEMENT  01/19/2016   STENT SYNERGY DES 0.30S92 drug eluting stent was successfully placed, and overlaps the 2.5 x 38 mm Synergy stent placed distally.   ESOPHAGOGASTRODUODENOSCOPY (EGD) WITH PROPOFOL N/A 04/09/2014   Procedure: ESOPHAGOGASTRODUODENOSCOPY (EGD) WITH PROPOFOL;  Surgeon: Cleotis Nipper, MD;  [facility]: [facility];  Service: Endoscopy;  Laterality: N/A;   FOOT SURGERY Bilateral    approx ~2000   HEMORRHOID SURGERY     HERNIA REPAIR     KNEE ARTHROSCOPY     RADIOACTIVE SEED GUIDED EXCISIONAL BREAST BIOPSY Left 01/20/2021   Procedure: RADIOACTIVE SEED GUIDED EXCISIONAL LEFT BREAST BIOPSY;  Surgeon: Stark Klein, MD;  Location: [facility];  Service: General;  Laterality: Left;    There were no vitals filed for this visit.   Subjective Assessment - 05/27/21 1020     Subjective Pt is going 3x/day and no leakage.  I still don't feel like I am expelling everything.  Pt said her husband has stomach cancer and will need surgery.  It has been rough                               [facility] Adult PT Treatment/Exercise - 05/27/21 0001       Lumbar Exercises: Stretches  Other Lumbar Stretch Exercise upper trap, thoracic and lumbar rotation, lumbar flexion and sidebending - 5 x 10 sec each way      Manual Therapy   Manual Therapy Soft tissue mobilization    Soft tissue mobilization thoracic and lumbar paraspinals- semi reclined                     PT Education - 05/27/21 1102     Education Details Access Code: [email or web address]    Person(s) Educated Patient    Methods Explanation;Demonstration;Tactile cues;Verbal cues;Handout    Comprehension Verbalized understanding;Returned demonstration              PT Short Term Goals - 05/27/21 1021       PT SHORT TERM GOAL #1   Title ind with ab massage    Status Achieved               PT Long Term Goals - 05/27/21 1021       PT  LONG TERM GOAL #1   Title Pt reports BMs occurring 3-4/week without straining    Baseline going 3/day because I am not emptying all the way    Status On-going      PT LONG TERM GOAL #2   Title Pt reports more solid stool due to improvd pelvic floor muscle tone    Baseline it has been some of both    Status On-going      PT LONG TERM GOAL #3   Title Pt will be ind with advanced HEP for pelvic strength    Status On-going      PT LONG TERM GOAL #4   Title Pt will report 50% less leakage    Baseline has not been leaking    Status On-going                   Plan - 05/27/21 1055     Clinical Impression Statement Pt was doing much better and has not had leakage.  Pt was very tight throughout the lumbar and thoracic spine.  Pt has some increased muscle length with STM but needed to be in semi reclined positions due to reflux so more difficulty to get into the tissues.  Pt was given stretches to continue working on improved soft tissue.    PT Treatment/Interventions ADLs/Self Care Home Management;Biofeedback;Cryotherapy;Electrical Stimulation;Neuromuscular re-education;Therapeutic exercise;Therapeutic activities;Patient/family education;Manual techniques;Passive range of motion;Dry needling;Taping;Moist Heat    PT Next Visit Plan STM to back maybe in seated or sidelying; core strengthening, internal soft tissue if needed    PT Home Exercise Plan Access Code: [email or web address]    Consulted and Agree with Plan of Care Patient             Patient will benefit from skilled therapeutic intervention in order to improve the following deficits and impairments:  Pain, Increased fascial restricitons, Decreased strength, Decreased endurance  Visit Diagnosis: Muscle weakness (generalized)  Abnormal posture     Problem List Patient Active Problem List   Diagnosis Date Noted   Hypomagnesemia 05/13/2021   Dental caries 04/20/2021   Abnormal swallowing 03/29/2021   Acute non-ST segment  elevation myocardial infarction ([place]) 03/29/2021   Chronic diarrhea 03/29/2021   Daytime somnolence 03/29/2021   Abnormal mammogram of left breast 12/13/2020   Memory loss 03/20/2019   Severe persistent asthma without complication 27/51/7001   DM2 (diabetes mellitus, type 2) ([place]) 03/15/2018   Chronic kidney disease, stage 3a ([place]) 03/15/2018   Dyspnea 08/12/2017  Macrocytic anemia 08/12/2017   Therapeutic drug monitoring 05/17/2017   Lung nodule 08/01/2016   Chronic diastolic CHF (congestive heart failure) ([place]) 07/17/2016   Right leg swelling 02/27/2016   Uncontrolled type 2 diabetes mellitus with complication    Seizures ([place]) 01/27/2016   Numbness 01/27/2016   CAD in native artery 01/20/2016   Hypertensive heart disease 01/20/2016   NSTEMI (non-ST elevated myocardial infarction) ([place]) 01/18/2016   Dyspnea on exertion    Asthma exacerbation 07/21/2015   Diabetes mellitus type 2, controlled ([place]) 08/06/2014   Hyperlipidemia 08/06/2014   Allergic rhinitis 07/22/2008   Vocal cord dysfunction 07/22/2008   DYSPNEA 07/22/2008   Diabetes mellitus type 2, uncontrolled 07/21/2008   DIABETIC PERIPHERAL NEUROPATHY 07/21/2008   Depression with anxiety 07/21/2008   Essential hypertension 07/21/2008   Mild persistent chronic asthma without complication 83/38/2505   G E R D 07/21/2008   PULMONARY EMBOLISM, HX OF 07/21/2008    Jule Ser, PT 05/27/2021, 11:04 AM  [facility] @ [address] [place], [place], [address] Phone: [phone]   Fax:  [phone]  Name: [name] MRN: [id number] Date of Birth: 01-Jun-1950

## 2021-06-01 ENCOUNTER — Other Ambulatory Visit: Payer: Self-pay | Admitting: Cardiology

## 2021-06-01 NOTE — Telephone Encounter (Signed)
Pt's medication was sent to pt's pharmacy as requested. Confirmation received.  °

## 2021-06-05 ENCOUNTER — Encounter (HOSPITAL_COMMUNITY): Payer: Self-pay

## 2021-06-05 ENCOUNTER — Inpatient Hospital Stay (HOSPITAL_COMMUNITY)
Admission: EM | Admit: 2021-06-05 | Discharge: 2021-06-09 | DRG: 202 | Disposition: A | Discharge status: Home / Self Care | Payer: Medicare Other | Attending: Family Medicine | Admitting: Family Medicine

## 2021-06-05 ENCOUNTER — Emergency Department (HOSPITAL_COMMUNITY): Payer: Medicare Other

## 2021-06-05 ENCOUNTER — Other Ambulatory Visit: Payer: Self-pay

## 2021-06-05 ENCOUNTER — Telehealth: Payer: Self-pay | Admitting: Pulmonary Disease

## 2021-06-05 DIAGNOSIS — J383 Other diseases of vocal cords: Secondary | ICD-10-CM | POA: Diagnosis present

## 2021-06-05 DIAGNOSIS — E785 Hyperlipidemia, unspecified: Secondary | ICD-10-CM | POA: Diagnosis present

## 2021-06-05 DIAGNOSIS — J4551 Severe persistent asthma with (acute) exacerbation: Secondary | ICD-10-CM | POA: Diagnosis not present

## 2021-06-05 DIAGNOSIS — I2510 Atherosclerotic heart disease of native coronary artery without angina pectoris: Secondary | ICD-10-CM | POA: Diagnosis present

## 2021-06-05 DIAGNOSIS — I130 Hypertensive heart and chronic kidney disease with heart failure and stage 1 through stage 4 chronic kidney disease, or unspecified chronic kidney disease: Secondary | ICD-10-CM | POA: Diagnosis present

## 2021-06-05 DIAGNOSIS — Z20822 Contact with and (suspected) exposure to covid-19: Secondary | ICD-10-CM | POA: Diagnosis not present

## 2021-06-05 DIAGNOSIS — N1831 Chronic kidney disease, stage 3a: Secondary | ICD-10-CM | POA: Diagnosis present

## 2021-06-05 DIAGNOSIS — Z6830 Body mass index (BMI) 30.0-30.9, adult: Secondary | ICD-10-CM

## 2021-06-05 DIAGNOSIS — I252 Old myocardial infarction: Secondary | ICD-10-CM | POA: Diagnosis not present

## 2021-06-05 DIAGNOSIS — T380X5A Adverse effect of glucocorticoids and synthetic analogues, initial encounter: Secondary | ICD-10-CM | POA: Diagnosis present

## 2021-06-05 DIAGNOSIS — J050 Acute obstructive laryngitis [croup]: Secondary | ICD-10-CM

## 2021-06-05 DIAGNOSIS — Z87891 Personal history of nicotine dependence: Secondary | ICD-10-CM

## 2021-06-05 DIAGNOSIS — F32A Depression, unspecified: Secondary | ICD-10-CM | POA: Diagnosis not present

## 2021-06-05 DIAGNOSIS — Z90710 Acquired absence of both cervix and uterus: Secondary | ICD-10-CM

## 2021-06-05 DIAGNOSIS — Z79899 Other long term (current) drug therapy: Secondary | ICD-10-CM

## 2021-06-05 DIAGNOSIS — J441 Chronic obstructive pulmonary disease with (acute) exacerbation: Secondary | ICD-10-CM

## 2021-06-05 DIAGNOSIS — E669 Obesity, unspecified: Secondary | ICD-10-CM | POA: Diagnosis not present

## 2021-06-05 DIAGNOSIS — J9811 Atelectasis: Secondary | ICD-10-CM | POA: Diagnosis present

## 2021-06-05 DIAGNOSIS — J45901 Unspecified asthma with (acute) exacerbation: Secondary | ICD-10-CM | POA: Diagnosis present

## 2021-06-05 DIAGNOSIS — Z8249 Family history of ischemic heart disease and other diseases of the circulatory system: Secondary | ICD-10-CM

## 2021-06-05 DIAGNOSIS — K219 Gastro-esophageal reflux disease without esophagitis: Secondary | ICD-10-CM | POA: Diagnosis present

## 2021-06-05 DIAGNOSIS — N179 Acute kidney failure, unspecified: Secondary | ICD-10-CM | POA: Diagnosis not present

## 2021-06-05 DIAGNOSIS — D72829 Elevated white blood cell count, unspecified: Secondary | ICD-10-CM

## 2021-06-05 DIAGNOSIS — Z7984 Long term (current) use of oral hypoglycemic drugs: Secondary | ICD-10-CM

## 2021-06-05 DIAGNOSIS — R609 Edema, unspecified: Secondary | ICD-10-CM | POA: Diagnosis not present

## 2021-06-05 DIAGNOSIS — R0602 Shortness of breath: Secondary | ICD-10-CM | POA: Diagnosis present

## 2021-06-05 DIAGNOSIS — R061 Stridor: Secondary | ICD-10-CM

## 2021-06-05 DIAGNOSIS — R0603 Acute respiratory distress: Secondary | ICD-10-CM | POA: Diagnosis not present

## 2021-06-05 DIAGNOSIS — Z833 Family history of diabetes mellitus: Secondary | ICD-10-CM | POA: Diagnosis not present

## 2021-06-05 DIAGNOSIS — I5032 Chronic diastolic (congestive) heart failure: Secondary | ICD-10-CM | POA: Diagnosis not present

## 2021-06-05 DIAGNOSIS — E1165 Type 2 diabetes mellitus with hyperglycemia: Secondary | ICD-10-CM | POA: Diagnosis present

## 2021-06-05 DIAGNOSIS — F419 Anxiety disorder, unspecified: Secondary | ICD-10-CM | POA: Diagnosis present

## 2021-06-05 DIAGNOSIS — G928 Other toxic encephalopathy: Secondary | ICD-10-CM | POA: Diagnosis not present

## 2021-06-05 DIAGNOSIS — R911 Solitary pulmonary nodule: Secondary | ICD-10-CM | POA: Diagnosis present

## 2021-06-05 DIAGNOSIS — E1122 Type 2 diabetes mellitus with diabetic chronic kidney disease: Secondary | ICD-10-CM | POA: Diagnosis not present

## 2021-06-05 DIAGNOSIS — Z955 Presence of coronary angioplasty implant and graft: Secondary | ICD-10-CM

## 2021-06-05 LAB — CBC WITH DIFFERENTIAL/PLATELET
Abs Immature Granulocytes: 0.09 10*3/uL — ABNORMAL HIGH (ref 0.00–0.07)
Basophils Absolute: 0.1 10*3/uL (ref 0.0–0.1)
Basophils Relative: 1 %
Eosinophils Absolute: 0 10*3/uL (ref 0.0–0.5)
Eosinophils Relative: 0 %
HCT: 32 % — ABNORMAL LOW (ref 36.0–46.0)
Hemoglobin: 10.1 g/dL — ABNORMAL LOW (ref 12.0–15.0)
Immature Granulocytes: 1 %
Lymphocytes Relative: 12 %
Lymphs Abs: 1.2 10*3/uL (ref 0.7–4.0)
MCH: 30.9 pg (ref 26.0–34.0)
MCHC: 31.6 g/dL (ref 30.0–36.0)
MCV: 97.9 fL (ref 80.0–100.0)
Monocytes Absolute: 0.3 10*3/uL (ref 0.1–1.0)
Monocytes Relative: 3 %
Neutro Abs: 8.1 10*3/uL — ABNORMAL HIGH (ref 1.7–7.7)
Neutrophils Relative %: 83 %
Platelets: 424 10*3/uL — ABNORMAL HIGH (ref 150–400)
RBC: 3.27 MIL/uL — ABNORMAL LOW (ref 3.87–5.11)
RDW: 14.8 % (ref 11.5–15.5)
WBC: 9.8 10*3/uL (ref 4.0–10.5)
nRBC: 0.2 % (ref 0.0–0.2)

## 2021-06-05 LAB — BLOOD GAS, VENOUS
Acid-base deficit: 7.5 mmol/L — ABNORMAL HIGH (ref 0.0–2.0)
Bicarbonate: 17.6 mmol/L — ABNORMAL LOW (ref 20.0–28.0)
O2 Saturation: 93.7 %
Patient temperature: 98.6
pCO2, Ven: 36.3 mmHg — ABNORMAL LOW (ref 44.0–60.0)
pH, Ven: 7.308 (ref 7.250–7.430)
pO2, Ven: 75.2 mmHg — ABNORMAL HIGH (ref 32.0–45.0)

## 2021-06-05 LAB — COMPREHENSIVE METABOLIC PANEL
ALT: 18 U/L (ref 0–44)
AST: 21 U/L (ref 15–41)
Albumin: 4.2 g/dL (ref 3.5–5.0)
Alkaline Phosphatase: 70 U/L (ref 38–126)
Anion gap: 10 (ref 5–15)
BUN: 20 mg/dL (ref 8–23)
CO2: 20 mmol/L — ABNORMAL LOW (ref 22–32)
Calcium: 8.9 mg/dL (ref 8.9–10.3)
Chloride: 103 mmol/L (ref 98–111)
Creatinine, Ser: 1.04 mg/dL — ABNORMAL HIGH (ref 0.44–1.00)
GFR, Estimated: 57 mL/min — ABNORMAL LOW (ref >60)
Glucose, Bld: 267 mg/dL — ABNORMAL HIGH (ref 70–99)
Potassium: 4.3 mmol/L (ref 3.5–5.1)
Sodium: 133 mmol/L — ABNORMAL LOW (ref 135–145)
Total Bilirubin: 0.3 mg/dL (ref 0.3–1.2)
Total Protein: 7.7 g/dL (ref 6.5–8.1)

## 2021-06-05 LAB — PROCALCITONIN: Procalcitonin: <0.1 ng/mL

## 2021-06-05 LAB — RESP PANEL BY RT-PCR (FLU A&B, COVID) ARPGX2
Influenza A by PCR: NEGATIVE
Influenza B by PCR: NEGATIVE
SARS Coronavirus 2 by RT PCR: NEGATIVE

## 2021-06-05 LAB — MAGNESIUM: Magnesium: 1.6 mg/dL — ABNORMAL LOW (ref 1.7–2.4)

## 2021-06-05 MED ORDER — ALBUTEROL SULFATE (2.5 MG/3ML) 0.083% IN NEBU: 2.5000 mg/h | INHALATION_SOLUTION | Freq: Once | RESPIRATORY_TRACT | Status: DC

## 2021-06-05 MED ORDER — ALBUTEROL SULFATE HFA 108 (90 BASE) MCG/ACT IN AERS: 2.0000 | INHALATION_SPRAY | RESPIRATORY_TRACT | Status: DC | PRN

## 2021-06-05 MED ORDER — RACEPINEPHRINE HCL 2.25 % IN NEBU
0.5000 mL | INHALATION_SOLUTION | Freq: Once | RESPIRATORY_TRACT | Status: AC
  Administered 2021-06-05: 0.5 mL via RESPIRATORY_TRACT
  Filled 2021-06-05: qty 0.5

## 2021-06-05 MED ORDER — POLYETHYLENE GLYCOL 3350 17 G PO PACK: 17.0000 g | PACK | Freq: Every day | ORAL | Status: DC | PRN

## 2021-06-05 MED ORDER — ENOXAPARIN SODIUM 40 MG/0.4ML IJ SOSY
40.0000 mg | PREFILLED_SYRINGE | INTRAMUSCULAR | Status: DC
  Administered 2021-06-06 – 2021-06-09 (×4): 40 mg via SUBCUTANEOUS
  Filled 2021-06-05 (×4): qty 0.4

## 2021-06-05 MED ORDER — FAMOTIDINE 20 MG PO TABS
20.0000 mg | ORAL_TABLET | Freq: Two times a day (BID) | ORAL | Status: DC
  Administered 2021-06-06 – 2021-06-08 (×6): 20 mg via ORAL
  Filled 2021-06-05 (×6): qty 1

## 2021-06-05 MED ORDER — METHYLPREDNISOLONE SODIUM SUCC 125 MG IJ SOLR
125.0000 mg | Freq: Once | INTRAMUSCULAR | Status: AC
  Administered 2021-06-05: 125 mg via INTRAVENOUS
  Filled 2021-06-05: qty 2

## 2021-06-05 MED ORDER — ALBUTEROL SULFATE (2.5 MG/3ML) 0.083% IN NEBU
10.0000 mg/h | INHALATION_SOLUTION | Freq: Once | RESPIRATORY_TRACT | Status: AC
  Administered 2021-06-05: 10 mg/h via RESPIRATORY_TRACT
  Filled 2021-06-05 (×2): qty 12

## 2021-06-05 MED ORDER — MAGNESIUM SULFATE 2 GM/50ML IV SOLN
2.0000 g | Freq: Once | INTRAVENOUS | Status: AC
  Administered 2021-06-05: 2 g via INTRAVENOUS
  Filled 2021-06-05: qty 50

## 2021-06-05 MED ORDER — ONDANSETRON HCL 4 MG/2ML IJ SOLN
4.0000 mg | Freq: Four times a day (QID) | INTRAMUSCULAR | Status: DC | PRN
  Filled 2021-06-05: qty 2

## 2021-06-05 MED ORDER — ALBUTEROL SULFATE (2.5 MG/3ML) 0.083% IN NEBU
2.5000 mg | INHALATION_SOLUTION | Freq: Once | RESPIRATORY_TRACT | Status: DC
  Filled 2021-06-05: qty 3

## 2021-06-05 MED ORDER — ALBUTEROL SULFATE (2.5 MG/3ML) 0.083% IN NEBU
10.0000 mg/h | INHALATION_SOLUTION | Freq: Once | RESPIRATORY_TRACT | Status: AC
  Administered 2021-06-05: 10 mg/h via RESPIRATORY_TRACT
  Filled 2021-06-05: qty 3

## 2021-06-05 MED ORDER — DOCUSATE SODIUM 100 MG PO CAPS: 100.0000 mg | ORAL_CAPSULE | Freq: Two times a day (BID) | ORAL | Status: DC | PRN

## 2021-06-05 NOTE — Telephone Encounter (Signed)
Weekend call update: Patient called in stating she had increased wheezing and shortness of breath despite using ICS/LABA nebulizer treatments and as needed albuterol.  She was prescribed a steroid taper at last visit on 05/17/2021 in case her breathing did not get better.  I instructed her to to start the prednisone taper and continue her nebulizer treatments.  Please schedule her for follow-up with one of the nurse practitioners on Tuesday or Wednesday this week.  She may benefit from the addition of Yupelri to her formoterol and budesonide nebulizer treatments.  Thanks, Freda Jackson, MD [facility] Office: [phone]

## 2021-06-05 NOTE — ED Notes (Signed)
RT at bedside to start continuous nebulizer treatment

## 2021-06-05 NOTE — ED Notes (Signed)
Respiratory at bedside.

## 2021-06-05 NOTE — H&P (Addendum)
NAME:  [name], MRN:  [id number], DOB:  [birth year], LOS: 0 ADMISSION DATE:  06/05/2021, CONSULTATION DATE:  06/05/20 REFERRING MD:  Eulis Foster , CHIEF COMPLAINT:  short of breath   History of Present Illness:  71 yo woman with hx of HFpEF, severe persistent asthma, Vocal cord dysfunction, CKD 3a, CAD, DM2, HTN, GERD, Depression, anxiety, HLD.  Per 11/22 note: treatment plan to maintain on performist, singular, budesonide.  Was hospitalized 1/13-1/14 with hypomag.  Plan for ENT referral. Brovana and pulmicort in house, not given steroids.   Seen on 05/17/21 in follow up by Dr. Melvyn Novas.  Reported she was hospitalized in Dec 22 with asthma flare (treated with standard asthma therapy per notes, mentioned need for ENT referral as OP, not seen by pulm per my review of notes).  Not quite returned to baseline per note.  Albuterol 4x per week.  Per note, taking Yupelri (Lama)/perforomist(LAMA)/singulair , though I dont see Yupelri on med list.   Called Pulmonary yesterday due to increased Wheezing and SOB, despite using ICS/LABA (formolerol and budesonide) and prn albuterol.  Instructed to start prednisone taper.   Per patient she has been coughing for 1 day.  Not improving despite home medications.  Per RT and ED, she was having expiratory wheezing sounds coming from upper airway prior to racemic epi.   She tells me she is often tachycardic after receiving albuterol Co chest discomfort, started in the ED.   IN ED: hypertensive, tachycardic after receiving albuterol.   Short of breath.  100% on RA.  Received mag 2g, albuterol nebs x2, Racemic epi nebs 2058, methylpred 125 x1 at 1825,  Pertinent  Medical History  HFpEF, severe persistent asthma, Vocal cord dysfunction, CKD 3a, CAD, DM2, HTN, GERD, Depression, anxiety, HLD.  Normal PFTs 2012 Frequent non-specific "respiratory flares" - cough, wheeze, SOB.   Home meds  Montelukast 10 Formoterol  Budesonide nebs  Yupelri??  Xanax0.5 BID prn  Lipitor 40   Budenonide nebs  Flexeril  Albuterol  Diltiazem 240 q day Depakote 500 qday  Nexium 40 BID Flonase BID Perforomist BID Hydralazine 100BID Xalatan 0.005% 1gtt both eyes qhs Namenda 10 mg BID Metfromin  Bystolic 10 daily   Allergies chlorthalidone and tramadol     Significant Hospital Events: Including procedures, antibiotic start and stop dates in addition to other pertinent events       Objective   Blood pressure (!) 172/99, pulse (!) 115, temperature 98.6 F (37 C), temperature source Oral, resp. rate 20, height 5\' 2"  (1.575 m), weight 74.8 kg, SpO2 98 %.        Intake/Output Summary (Last 24 hours) at 06/05/2021 2140 Last data filed at 06/05/2021 2057 Gross per 24 hour  Intake 50 ml  Output --  Net 50 ml   Filed Weights   06/05/21 1742  Weight: 74.8 kg    Examination: General: intermittent end expiratory wheezing.  Minimal  HENT: ncat Lungs: minimal end exp wheezing Cardiovascular:  tachycardic no mgr  Abdomen: nt, nd, nbs  Extremities: mild ankle edema non pitting L slightly > R.  Neuro: alert and oriented  MSK: chest very tender to palpation LE non tender  LAST Pfts from 03/2018 Pulmonary Function Diagnosis: Severe Restriction with disproportionate reduction in ERV c/w body habitus  CXR: no acute disease.  Resolved Hospital Problem list     Assessment & Plan:  Acute respiratory distress, cough, wheezing: cough x 1 day, scant white sputum.  No fever.  Per RT, had more wheezing  emanating from upper airways prior to racemic epi.  Hx of asthma per chart thought PFTs not consistent.  Concern for VCD though no laryngoscopy done.   Check VBG, resp viral panel.   [] repeat Mag in am Not hypoxic.  Regarding possibility of PE (chest pain and LE swelling), prior diagnosis of DVT was >40 years ago, no recent immobility or cancer.  Tachycardia appears 2/2 albuterol.  R ankle swelling is minimally increased compared to L, also no tenderness.  Chest pain very  tender to palp.   Will check [place] LE doppler but hold off on further imaging right now, PE seems less likely.   Regarding admission, she is doing well now. Given her tachycardia and initial presentation with severe respiratory distress, I think it is reasonable to admit her to stepdown unit for observation overnight.    Chest pain: check trop. Very tender to palpation, likely 2/2 cough.   Tachycardia: likely 2/2 albuterol   LE edema: venous US.   GERD: nexium stopped by nephrologist Cont pepcid.   HTN Home meds   DM 2 Insulin sliding scale  Recent Root canal (Thursday, 3 days ago) On percocet at home.   Cont pain meds  Anxiety/Depression: cont home meds.  Also particularly stressed due to recent cancer and stroke diagnosis in husband, he is undergoing CEA surgery tomorrow at [facility].   CKD cr currrently 1.04, below reported "baseline"   Best Practice (right click and "Reselect all SmartList Selections" daily)   Diet/type: Regular consistency (see orders) DVT prophylaxis: LMWH GI prophylaxis: H2B Lines: N/A Foley:  N/A Code Status:  full code Last date of multidisciplinary goals of care discussion []   Labs   CBC: Recent Labs  Lab 06/05/21 1806  WBC 9.8  NEUTROABS 8.1*  HGB 10.1*  HCT 32.0*  MCV 97.9  PLT 424*    Basic Metabolic Panel: Recent Labs  Lab 06/05/21 1806  NA 133*  K 4.3  CL 103  CO2 20*  GLUCOSE 267*  BUN 20  CREATININE 1.04*  CALCIUM 8.9  MG 1.6*   GFR: Estimated Creatinine Clearance: 46.3 mL/min (A) (by C-G formula based on SCr of 1.04 mg/dL (H)). Recent Labs  Lab 06/05/21 1806  WBC 9.8    Liver Function Tests: Recent Labs  Lab 06/05/21 1806  AST 21  ALT 18  ALKPHOS 70  BILITOT 0.3  PROT 7.7  ALBUMIN 4.2   No results for input(s): LIPASE, AMYLASE in the last 168 hours. No results for input(s): AMMONIA in the last 168 hours.  ABG    Component Value Date/Time   PHART 7.382 08/12/2017 0550   PCO2ART 45.0 08/12/2017 0550    PO2ART 163.0 (H) 08/12/2017 0550   HCO3 28.6 (H) 03/15/2018 1100   TCO2 30 03/15/2018 1100   ACIDBASEDEF 1.0 12/19/2017 1820   O2SAT 71.0 03/15/2018 1100     Coagulation Profile: No results for input(s): INR, PROTIME in the last 168 hours.  Cardiac Enzymes: No results for input(s): CKTOTAL, CKMB, CKMBINDEX, TROPONINI in the last 168 hours.  HbA1C: Hgb A1c MFr Bld  Date/Time Value Ref Range Status  04/05/2021 12:22 AM 6.8 (H) 4.8 - 5.6 % Final    Comment:    (NOTE) Pre diabetes:          5.7%-6.4%  Diabetes:              >6.4%  Glycemic control for   <7.0% adults with diabetes   01/14/2021 11:32 AM 6.0 (H) 4.8 - 5.6 %  Final    Comment:    (NOTE) Pre diabetes:          5.7%-6.4%  Diabetes:              >6.4%  Glycemic control for   <7.0% adults with diabetes     CBG: No results for input(s): GLUCAP in the last 168 hours.  Review of Systems:   Review of Systems  Constitutional:  Negative for fever.  HENT:  Negative for hearing loss.   Eyes:  Negative for blurred vision.  Respiratory:  Positive for cough, sputum production and shortness of breath.   Cardiovascular:  Negative for chest pain.  Gastrointestinal:  Positive for heartburn.  Genitourinary:  Negative for dysuria.  Musculoskeletal:  Negative for myalgias.  Skin:  Negative for rash.  Neurological:  Negative for dizziness.  Endo/Heme/Allergies:  Does not bruise/bleed easily.  Psychiatric/Behavioral:  Negative for depression.     Past Medical History:  She,  has a past medical history of Anxiety, Arthritis, Asthma, Chronic diastolic CHF (congestive heart failure) ([place]) (07/17/2016), Coronary artery disease, Dementia ([place]), Depression, Diabetes mellitus, Dyslipidemia, GERD (gastroesophageal reflux disease), History of seizure, Hypertension, Hypertensive heart disease, NSTEMI (non-ST elevated myocardial infarction) ([place]) (12/2015), and Stage 3 chronic kidney disease ([place]).   Surgical History:   Past  Surgical History:  Procedure Laterality Date   ABDOMINAL HYSTERECTOMY     CARDIAC CATHETERIZATION  1994   minimal LAD dz, no other dz, EF normal   CARDIAC CATHETERIZATION N/A 01/18/2016   Procedure: Left Heart Cath and Coronary Angiography;  Surgeon: Burnell Blanks, MD;  Location: [facility];  Service: Cardiovascular;  Laterality: N/A;   CARDIAC CATHETERIZATION N/A 01/18/2016   Procedure: Coronary Stent Intervention;  Surgeon: Burnell Blanks, MD;  Location: [facility];  Service: Cardiovascular;  Laterality: N/A;   COLONOSCOPY WITH PROPOFOL N/A 04/09/2014   Procedure: COLONOSCOPY WITH PROPOFOL;  Surgeon: Cleotis Nipper, MD;  [facility];  Service: Endoscopy;  Laterality: N/A;   CORONARY STENT PLACEMENT  01/19/2016   STENT SYNERGY DES 5.17O16 drug eluting stent was successfully placed, and overlaps the 2.5 x 38 mm Synergy stent placed distally.   ESOPHAGOGASTRODUODENOSCOPY (EGD) WITH PROPOFOL N/A 04/09/2014   Procedure: ESOPHAGOGASTRODUODENOSCOPY (EGD) WITH PROPOFOL;  Surgeon: Cleotis Nipper, MD;  [facility];  Service: Endoscopy;  Laterality: N/A;   FOOT SURGERY Bilateral    approx ~2000   HEMORRHOID SURGERY     HERNIA REPAIR     KNEE ARTHROSCOPY     RADIOACTIVE SEED GUIDED EXCISIONAL BREAST BIOPSY Left 01/20/2021   Procedure: RADIOACTIVE SEED GUIDED EXCISIONAL LEFT BREAST BIOPSY;  Surgeon: Stark Klein, MD;  Location: [facility];  Service: General;  Laterality: Left;     Social History:   reports that she quit smoking about 17 years ago. Her smoking use included cigarettes. She has a 30.00 pack-year smoking history. She has never used smokeless tobacco. She reports that she does not drink alcohol and does not use drugs.   Family History:  Her family history includes Allergies in her mother; Asthma in her mother; CAD (age of onset: 38) in her father; CAD (age of onset: 49) in her sister; Congestive Heart Failure in her sister; Diabetes  in her father; Hypertension in her father.   Allergies Allergies  Allergen Reactions   Chlorthalidone Other (See Comments)    Was stopped due to kidney function   Tramadol Other (See Comments)    Not to be taken because  of existing seizure disorder/SEIZURES     Home Medications  Prior to Admission medications   Medication Sig Start Date End Date Taking? Authorizing Provider  albuterol (PROAIR HFA) 108 (90 Base) MCG/ACT inhaler Inhale 2 puffs into the lungs every 4 (four) hours as needed for wheezing or shortness of breath. 04/20/21   Parrett, Fonnie Mu, NP  albuterol (PROVENTIL) (2.5 MG/3ML) 0.083% nebulizer solution Take 3 mLs (2.5 mg total) by nebulization every 4 (four) hours as needed for wheezing or shortness of breath. 05/17/21   Tanda Rockers, MD  ALPRAZolam Duanne Moron) 0.5 MG tablet Take 1 tablet (0.5 mg total) by mouth 2 (two) times daily as needed for anxiety. Patient taking differently: Take 0.5 mg by mouth 2 (two) times daily as needed for anxiety or sleep. 07/31/15   Theodis Blaze, MD  atorvastatin (LIPITOR) 40 MG tablet Take 40 mg by mouth at bedtime.    [provider]  Biotin 5000 MCG CAPS Take 5,000 mcg by mouth daily.    [provider]  budesonide (PULMICORT) 0.5 MG/2ML nebulizer solution USE 1 VIAL IN NEBULIZER IN THE MORNING AND AT BEDTIME Patient taking differently: Take 0.5 mg by nebulization in the morning and at bedtime. 03/29/21   Tanda Rockers, MD  cyclobenzaprine (FLEXERIL) 10 MG tablet Take 1 tablet (10 mg total) by mouth 2 (two) times daily as needed for muscle spasms. 05/15/21   Isla Pence, MD  diltiazem (CARDIZEM CD) 240 MG 24 hr capsule Take 1 capsule (240 mg total) by mouth daily. 03/28/21   Jerline Pain, MD  divalproex (DEPAKOTE ER) 500 MG 24 hr tablet Take 1 tablet (500 mg total) by mouth at bedtime. 03/15/21   Marcial Pacas, MD  esomeprazole (NEXIUM) 40 MG capsule Take 40 mg by mouth 2 (two) times daily before a meal.    [provider]  fluconazole (DIFLUCAN) 150 MG tablet Take 150 mg by mouth See admin instructions. Take 150 mg by mouth once as directed and repeat in 3 days, if necessary    [provider]  fluticasone (FLONASE) 50 MCG/ACT nasal spray Place 2 sprays into both nostrils daily as needed for allergies.    [provider]  formoterol (PERFOROMIST) 20 MCG/2ML nebulizer solution USE 1 VIAL  IN  NEBULIZER TWICE  DAILY - morning and evening Patient taking differently: 20 mcg 2 (two) times daily. 03/29/21   Tanda Rockers, MD  hydrALAZINE (APRESOLINE) 100 MG tablet Take 1 tablet (100 mg total) by mouth 3 (three) times daily. Please make yearly appt with Dr. Marlou Porch for April 2023 for future refills. Thank you 1st attempt 06/01/21   Jerline Pain, MD  latanoprost (XALATAN) 0.005 % ophthalmic solution Place 1 drop into both eyes at bedtime. 02/05/20   [provider]  magnesium oxide (MAG-OX) 400 (240 Mg) MG tablet Take 1 tablet (400 mg total) by mouth daily. 05/14/21   Lavina Hamman, MD  memantine (NAMENDA) 10 MG tablet Take 1 tablet (10 mg total) by mouth 2 (two) times daily. Patient taking differently: Take 10 mg by mouth in the morning and at bedtime. 03/15/21   Marcial Pacas, MD  metFORMIN (GLUCOPHAGE) 500 MG tablet Take 500 mg by mouth 2 (two) times daily with a meal.    [provider]  montelukast (SINGULAIR) 10 MG tablet Take 1 tablet (10 mg total) by mouth at bedtime. 05/25/21   Tanda Rockers, MD  Multiple Vitamin (MULTIVITAMIN WITH MINERALS) TABS tablet Take 1  tablet by mouth every morning.    [provider]  nebivolol (BYSTOLIC) 10 MG tablet Take 1 tablet (10 mg total) by mouth daily. 04/10/19   Jerline Pain, MD  nitroGLYCERIN (NITROSTAT) 0.4 MG SL tablet DISSOLVE ONE TABLET UNDER THE TONGUE EVERY 5 MINUTES AS NEEDED FOR CHEST PAIN.  DO NOT EXCEED A TOTAL OF 3 DOSES IN 15 MINUTES Patient taking differently: 0.4 mg every 5 (five) minutes as needed for chest  pain. 11/19/19   Burtis Junes, NP  potassium chloride SA (KLOR-CON M) 20 MEQ tablet Take 1 tablet (20 mEq total) by mouth every other day. 05/14/21   Lavina Hamman, MD  predniSONE (DELTASONE) 10 MG tablet Take  4 each am x 2 days,   2 each am x 2 days,  1 each am x 2 days and stop 05/17/21   Tanda Rockers, MD  Probiotic Product (EQ PROBIOTIC) CAPS Take 1 capsule by mouth every morning.    [provider]  Psyllium (METAMUCIL PO) Take by mouth See admin instructions. Mix powder as directed and drink once a day as directed for constipation    [provider]     Critical care time: 45 min

## 2021-06-05 NOTE — ED Provider Notes (Signed)
[facility] DEPT Provider Note   CSN: [id number] Arrival date & time: 06/05/21  1732     History  Chief Complaint  Patient presents with   Shortness of Breath   Asthma    [name] is a 71 y.o. female.  HPI She presents for evaluation of shortness of breath unresponsive to her home nebulizer.  Recurrent symptoms, following hospital discharge "2 weeks ago."  At that time she had been admitted for hypomagnesemia.  In the interim she was seen in the ED on 05/15/2021.    Home Medications Prior to Admission medications   Medication Sig Start Date End Date Taking? Authorizing Provider  albuterol (PROAIR HFA) 108 (90 Base) MCG/ACT inhaler Inhale 2 puffs into the lungs every 4 (four) hours as needed for wheezing or shortness of breath. 04/20/21   Parrett, Fonnie Mu, NP  albuterol (PROVENTIL) (2.5 MG/3ML) 0.083% nebulizer solution Take 3 mLs (2.5 mg total) by nebulization every 4 (four) hours as needed for wheezing or shortness of breath. 05/17/21   Tanda Rockers, MD  ALPRAZolam Duanne Moron) 0.5 MG tablet Take 1 tablet (0.5 mg total) by mouth 2 (two) times daily as needed for anxiety. Patient taking differently: Take 0.5 mg by mouth 2 (two) times daily as needed for anxiety or sleep. 07/31/15   Theodis Blaze, MD  atorvastatin (LIPITOR) 40 MG tablet Take 40 mg by mouth at bedtime.    [provider]  Biotin 5000 MCG CAPS Take 5,000 mcg by mouth daily.    [provider]  budesonide (PULMICORT) 0.5 MG/2ML nebulizer solution USE 1 VIAL IN NEBULIZER IN THE MORNING AND AT BEDTIME Patient taking differently: Take 0.5 mg by nebulization in the morning and at bedtime. 03/29/21   Tanda Rockers, MD  cyclobenzaprine (FLEXERIL) 10 MG tablet Take 1 tablet (10 mg total) by mouth 2 (two) times daily as needed for muscle spasms. 05/15/21   Isla Pence, MD  diltiazem (CARDIZEM CD) 240 MG 24 hr capsule Take 1 capsule (240 mg total) by mouth daily. 03/28/21    Jerline Pain, MD  divalproex (DEPAKOTE ER) 500 MG 24 hr tablet Take 1 tablet (500 mg total) by mouth at bedtime. 03/15/21   Marcial Pacas, MD  esomeprazole (NEXIUM) 40 MG capsule Take 40 mg by mouth 2 (two) times daily before a meal.    [provider]  fluconazole (DIFLUCAN) 150 MG tablet Take 150 mg by mouth See admin instructions. Take 150 mg by mouth once as directed and repeat in 3 days, if necessary    [provider]  fluticasone (FLONASE) 50 MCG/ACT nasal spray Place 2 sprays into both nostrils daily as needed for allergies.    [provider]  formoterol (PERFOROMIST) 20 MCG/2ML nebulizer solution USE 1 VIAL  IN  NEBULIZER TWICE  DAILY - morning and evening Patient taking differently: 20 mcg 2 (two) times daily. 03/29/21   Tanda Rockers, MD  hydrALAZINE (APRESOLINE) 100 MG tablet Take 1 tablet (100 mg total) by mouth 3 (three) times daily. Please make yearly appt with Dr. Marlou Porch for April 2023 for future refills. Thank you 1st attempt 06/01/21   Jerline Pain, MD  latanoprost (XALATAN) 0.005 % ophthalmic solution Place 1 drop into both eyes at bedtime. 02/05/20   [provider]  magnesium oxide (MAG-OX) 400 (240 Mg) MG tablet Take 1 tablet (400 mg total) by mouth daily. 05/14/21   Lavina Hamman, MD  memantine (NAMENDA) 10 MG tablet  Take 1 tablet (10 mg total) by mouth 2 (two) times daily. Patient taking differently: Take 10 mg by mouth in the morning and at bedtime. 03/15/21   Marcial Pacas, MD  metFORMIN (GLUCOPHAGE) 500 MG tablet Take 500 mg by mouth 2 (two) times daily with a meal.    [provider]  montelukast (SINGULAIR) 10 MG tablet Take 1 tablet (10 mg total) by mouth at bedtime. 05/25/21   Tanda Rockers, MD  Multiple Vitamin (MULTIVITAMIN WITH MINERALS) TABS tablet Take 1 tablet by mouth every morning.    [provider]  nebivolol (BYSTOLIC) 10 MG tablet Take 1 tablet (10 mg total) by mouth daily. 04/10/19   Jerline Pain, MD   nitroGLYCERIN (NITROSTAT) 0.4 MG SL tablet DISSOLVE ONE TABLET UNDER THE TONGUE EVERY 5 MINUTES AS NEEDED FOR CHEST PAIN.  DO NOT EXCEED A TOTAL OF 3 DOSES IN 15 MINUTES Patient taking differently: 0.4 mg every 5 (five) minutes as needed for chest pain. 11/19/19   Burtis Junes, NP  potassium chloride SA (KLOR-CON M) 20 MEQ tablet Take 1 tablet (20 mEq total) by mouth every other day. 05/14/21   Lavina Hamman, MD  predniSONE (DELTASONE) 10 MG tablet Take  4 each am x 2 days,   2 each am x 2 days,  1 each am x 2 days and stop 05/17/21   Tanda Rockers, MD  Probiotic Product (EQ PROBIOTIC) CAPS Take 1 capsule by mouth every morning.    [provider]  Psyllium (METAMUCIL PO) Take by mouth See admin instructions. Mix powder as directed and drink once a day as directed for constipation    [provider]      Allergies    Chlorthalidone and Tramadol    Review of Systems   Review of Systems  Physical Exam Updated Vital Signs BP (!) 172/99    Pulse (!) 115    Temp 98.6 F (37 C) (Oral)    Resp 20    Ht 5\' 2"  (1.575 m)    Wt 74.8 kg    LMP  (LMP Unknown)    SpO2 99%    BMI 30.18 kg/m  Physical Exam Vitals and nursing note reviewed.  Constitutional:      General: She is not in acute distress.    Appearance: She is well-developed. She is not ill-appearing, toxic-appearing or diaphoretic.  HENT:     Head: Normocephalic and atraumatic.     Right Ear: External ear normal.     Left Ear: External ear normal.  Eyes:     Conjunctiva/sclera: Conjunctivae normal.     Pupils: Pupils are equal, round, and reactive to light.  Neck:     Trachea: Phonation normal.  Cardiovascular:     Rate and Rhythm: Normal rate and regular rhythm.     Heart sounds: Normal heart sounds.  Pulmonary:     Effort: Pulmonary effort is normal.     Comments: Loud whistle at end of expiration, audible from bedside.  Decreased air movement bilaterally without audible wheezes with stethoscope.  Mild  tachypnea is present.  She is splinting somewhat to help breathing.  She is not in severe respiratory distress. Abdominal:     General: There is no distension.     Palpations: Abdomen is soft.     Tenderness: There is no abdominal tenderness.  Musculoskeletal:        General: Normal range of motion.     Cervical back: Normal range of  motion and neck supple.  Skin:    General: Skin is warm and dry.  Neurological:     Mental Status: She is alert and oriented to person, place, and time.     Cranial Nerves: No cranial nerve deficit.     Sensory: No sensory deficit.     Motor: No abnormal muscle tone.     Coordination: Coordination normal.  Psychiatric:        Behavior: Behavior normal.        Thought Content: Thought content normal.        Judgment: Judgment normal.    ED Results / Procedures / Treatments   Labs (all labs ordered are listed, but only abnormal results are displayed) Labs Reviewed  COMPREHENSIVE METABOLIC PANEL - Abnormal; Notable for the following components:      Result Value   Sodium 133 (*)    CO2 20 (*)    Glucose, Bld 267 (*)    Creatinine, Ser 1.04 (*)    GFR, Estimated 57 (*)    All other components within normal limits  CBC WITH DIFFERENTIAL/PLATELET - Abnormal; Notable for the following components:   RBC 3.27 (*)    Hemoglobin 10.1 (*)    HCT 32.0 (*)    Platelets 424 (*)    Neutro Abs 8.1 (*)    Abs Immature Granulocytes 0.09 (*)    All other components within normal limits  MAGNESIUM - Abnormal; Notable for the following components:   Magnesium 1.6 (*)    All other components within normal limits  RESP PANEL BY RT-PCR (FLU A&B, COVID) [id number]    EKG None  Radiology DG Chest 2 View  Result Date: 06/05/2021 CLINICAL DATA:  Shortness of breath EXAM: CHEST - 2 VIEW COMPARISON:  Chest radiograph dated May 13, 2021 FINDINGS: The heart size and mediastinal contours are within normal limits. Both lungs are clear. The visualized skeletal  structures are unremarkable. IMPRESSION: No active cardiopulmonary disease. Electronically Signed   By: Keane Police D.O.   On: 06/05/2021 18:16    Procedures Procedures    Medications Ordered in ED Medications  albuterol (VENTOLIN HFA) 108 (90 Base) MCG/ACT inhaler 2 puff (has no administration in time range)  albuterol (PROVENTIL) (2.5 MG/3ML) 0.083% nebulizer solution (10 mg/hr Nebulization Given 06/05/21 1859)  methylPREDNISolone sodium succinate (SOLU-MEDROL) 125 mg/2 mL injection 125 mg (125 mg Intravenous Given 06/05/21 1825)  magnesium sulfate IVPB 2 g 50 mL (0 g Intravenous Stopped 06/05/21 2057)  Racepinephrine HCl 2.25 % nebulizer solution 0.5 mL (0.5 mLs Nebulization Given 06/05/21 2058)  albuterol (PROVENTIL) (2.5 MG/3ML) 0.083% nebulizer solution (10 mg/hr Nebulization Given 06/05/21 2133)    ED Course/ Medical Decision Making/ A&P Clinical Course as of 06/05/21 2353  Sun Jun 05, 2021  1857 At this time the patient has better air movement auscultated after initial 10 mg nebulizer with albuterol.  Oxygenation 99% on room air.  She is lying semirecumbent.  She seems to be in less respiratory distress at this time.  No evidence for stridor.  Will repeat with second continuous neb. [EW]  2049 At this point patient is not having labored breathing but has resumed a whistling sound with breathing.  This is not new.  No overt stridor however I cannot rule out some airway narrowing.  Will give racemic epi nebulizer. [EW]  2129 No audible wheezing or stridor externally at this time.  Improved after epinephrine nebulizer. [EW]  2150 Findings discussed with intensivist who will see the patient  for possible admission. [EW]    Clinical Course User Index [EW] Daleen Bo, MD                           Medical Decision Making Patient presenting with likely COPD exacerbation.  She is also recently been treated for low magnesium and is using supplements that have been recommended and  prescribed.  Amount and/or Complexity of Data Reviewed External Data Reviewed: labs, radiology and notes.    Details: Patient follows closely with pulmonary, and has been diagnosed with pseudo asthma and vocal cord dysfunction.  She has however treated with medications for asthma/COPD.  She had a sleep study, 2 years ago and pulmonary function test, 3 years ago. Labs: ordered.    Details: CBC, metabolic panel, magnesium level-normal except sodium low, CO2 low, glucose high, creatinine high, GFR low, hemoglobin low, magnesium low Radiology: ordered and independent interpretation performed.    Details: Chest x-ray-no infiltrate or edema  Risk OTC drugs. Prescription drug management. Decision regarding hospitalization. Risk Details: Patient with chronic respiratory disorder, which is not completely specified, has elements of both COPD as well as pseudo asthma.  Today she had some clinical findings consistent with croup/stridor and seemed to improve after a epinephrine nebulizer.  She does not meet criteria for intubation.  Her oxygenation has remained normal.  Airway movement improves when giving albuterol nebulizer.  She is persistently tachycardic.  Case will be discussed with pulmonologist, to determine if they would like to admit her versus have the hospitalist admit her for observation.  Dr. Patsey Berthold saw the patient and decided to admit her.  Critical Care Total time providing critical care: 30-74 minutes          Final Clinical Impression(s) / ED Diagnoses Final diagnoses:  COPD exacerbation ([facility])  Croup    Rx / DC Orders ED Discharge Orders     None         Daleen Bo, MD 06/05/21 2354

## 2021-06-05 NOTE — ED Triage Notes (Signed)
Patient c/o SOB, expiratory wheezing/asthma. Patient states she has had 3 neb treatments today with no relief.  Room air sats-97%.

## 2021-06-06 ENCOUNTER — Inpatient Hospital Stay (HOSPITAL_COMMUNITY): Payer: Medicare Other

## 2021-06-06 DIAGNOSIS — J383 Other diseases of vocal cords: Secondary | ICD-10-CM | POA: Diagnosis not present

## 2021-06-06 DIAGNOSIS — J4551 Severe persistent asthma with (acute) exacerbation: Secondary | ICD-10-CM | POA: Diagnosis not present

## 2021-06-06 DIAGNOSIS — R0603 Acute respiratory distress: Secondary | ICD-10-CM

## 2021-06-06 DIAGNOSIS — R609 Edema, unspecified: Secondary | ICD-10-CM

## 2021-06-06 LAB — CBC WITH DIFFERENTIAL/PLATELET
Abs Immature Granulocytes: 0.24 10*3/uL — ABNORMAL HIGH (ref 0.00–0.07)
Basophils Absolute: 0 10*3/uL (ref 0.0–0.1)
Basophils Relative: 0 %
Eosinophils Absolute: 0 10*3/uL (ref 0.0–0.5)
Eosinophils Relative: 0 %
HCT: 32.9 % — ABNORMAL LOW (ref 36.0–46.0)
Hemoglobin: 10.5 g/dL — ABNORMAL LOW (ref 12.0–15.0)
Immature Granulocytes: 3 %
Lymphocytes Relative: 6 %
Lymphs Abs: 0.6 10*3/uL — ABNORMAL LOW (ref 0.7–4.0)
MCH: 31.6 pg (ref 26.0–34.0)
MCHC: 31.9 g/dL (ref 30.0–36.0)
MCV: 99.1 fL (ref 80.0–100.0)
Monocytes Absolute: 0.1 10*3/uL (ref 0.1–1.0)
Monocytes Relative: 1 %
Neutro Abs: 8.7 10*3/uL — ABNORMAL HIGH (ref 1.7–7.7)
Neutrophils Relative %: 90 %
Platelets: 389 10*3/uL (ref 150–400)
RBC: 3.32 MIL/uL — ABNORMAL LOW (ref 3.87–5.11)
RDW: 15 % (ref 11.5–15.5)
WBC: 9.6 10*3/uL (ref 4.0–10.5)
nRBC: 0.4 % — ABNORMAL HIGH (ref 0.0–0.2)

## 2021-06-06 LAB — HEMOGLOBIN A1C
Hgb A1c MFr Bld: 8.2 % — ABNORMAL HIGH (ref 4.8–5.6)
Mean Plasma Glucose: 188.64 mg/dL

## 2021-06-06 LAB — RESPIRATORY PANEL BY PCR

## 2021-06-06 LAB — BASIC METABOLIC PANEL
Anion gap: 12 (ref 5–15)
BUN: 22 mg/dL (ref 8–23)
CO2: 18 mmol/L — ABNORMAL LOW (ref 22–32)
Calcium: 9 mg/dL (ref 8.9–10.3)
Chloride: 101 mmol/L (ref 98–111)
Creatinine, Ser: 1.32 mg/dL — ABNORMAL HIGH (ref 0.44–1.00)
GFR, Estimated: 43 mL/min — ABNORMAL LOW (ref >60)
Glucose, Bld: 344 mg/dL — ABNORMAL HIGH (ref 70–99)
Potassium: 4.6 mmol/L (ref 3.5–5.1)
Sodium: 131 mmol/L — ABNORMAL LOW (ref 135–145)

## 2021-06-06 LAB — GLUCOSE, CAPILLARY
Glucose-Capillary: 286 mg/dL — ABNORMAL HIGH (ref 70–99)
Glucose-Capillary: 208 mg/dL — ABNORMAL HIGH (ref 70–99)
Glucose-Capillary: 328 mg/dL — ABNORMAL HIGH (ref 70–99)
Glucose-Capillary: 338 mg/dL — ABNORMAL HIGH (ref 70–99)

## 2021-06-06 LAB — CBC
HCT: 33.4 % — ABNORMAL LOW (ref 36.0–46.0)
Hemoglobin: 10.4 g/dL — ABNORMAL LOW (ref 12.0–15.0)
MCH: 31.2 pg (ref 26.0–34.0)
MCHC: 31.1 g/dL (ref 30.0–36.0)
MCV: 100.3 fL — ABNORMAL HIGH (ref 80.0–100.0)
Platelets: 377 10*3/uL (ref 150–400)
RBC: 3.33 MIL/uL — ABNORMAL LOW (ref 3.87–5.11)
RDW: 14.9 % (ref 11.5–15.5)
WBC: 9.6 10*3/uL (ref 4.0–10.5)
nRBC: 0.3 % — ABNORMAL HIGH (ref 0.0–0.2)

## 2021-06-06 LAB — PROCALCITONIN: Procalcitonin: <0.1 ng/mL

## 2021-06-06 LAB — MRSA NEXT GEN BY PCR, NASAL: MRSA by PCR Next Gen: NOT DETECTED

## 2021-06-06 LAB — CREATININE, SERUM
Creatinine, Ser: 0.99 mg/dL (ref 0.44–1.00)
GFR, Estimated: >60 mL/min (ref >60)

## 2021-06-06 LAB — TROPONIN I (HIGH SENSITIVITY): Troponin I (High Sensitivity): 3 ng/L (ref <18)

## 2021-06-06 LAB — MAGNESIUM: Magnesium: 2 mg/dL (ref 1.7–2.4)

## 2021-06-06 LAB — CBG MONITORING, ED: Glucose-Capillary: 314 mg/dL — ABNORMAL HIGH (ref 70–99)

## 2021-06-06 MED ORDER — BUDESONIDE 0.5 MG/2ML IN SUSP
0.5000 mg | Freq: Two times a day (BID) | RESPIRATORY_TRACT | Status: DC
  Administered 2021-06-06 – 2021-06-09 (×8): 0.5 mg via RESPIRATORY_TRACT
  Filled 2021-06-06 (×9): qty 2

## 2021-06-06 MED ORDER — MEMANTINE HCL 5 MG PO TABS
10.0000 mg | ORAL_TABLET | Freq: Every day | ORAL | Status: DC
  Administered 2021-06-06 – 2021-06-08 (×3): 10 mg via ORAL
  Filled 2021-06-06 (×3): qty 2

## 2021-06-06 MED ORDER — DILTIAZEM HCL ER 60 MG PO CP12
120.0000 mg | ORAL_CAPSULE | Freq: Two times a day (BID) | ORAL | Status: DC
  Administered 2021-06-06 – 2021-06-08 (×6): 120 mg via ORAL
  Filled 2021-06-06 (×6): qty 2

## 2021-06-06 MED ORDER — PREDNISONE 20 MG PO TABS
40.0000 mg | ORAL_TABLET | Freq: Every day | ORAL | Status: DC
  Administered 2021-06-07 – 2021-06-09 (×3): 40 mg via ORAL
  Filled 2021-06-06 (×3): qty 2

## 2021-06-06 MED ORDER — ATORVASTATIN CALCIUM 40 MG PO TABS
40.0000 mg | ORAL_TABLET | Freq: Every day | ORAL | Status: DC
  Administered 2021-06-06 – 2021-06-08 (×3): 40 mg via ORAL
  Filled 2021-06-06 (×3): qty 1

## 2021-06-06 MED ORDER — LATANOPROST 0.005 % OP SOLN
1.0000 [drp] | Freq: Every day | OPHTHALMIC | Status: DC
  Filled 2021-06-06: qty 2.5

## 2021-06-06 MED ORDER — OXYCODONE-ACETAMINOPHEN 5-325 MG PO TABS
1.0000 | ORAL_TABLET | ORAL | Status: DC | PRN
  Administered 2021-06-06 – 2021-06-07 (×4): 1 via ORAL
  Filled 2021-06-06 (×4): qty 1

## 2021-06-06 MED ORDER — DIPHENHYDRAMINE HCL 50 MG/ML IJ SOLN
12.5000 mg | Freq: Once | INTRAMUSCULAR | Status: AC
  Administered 2021-06-06: 12.5 mg via INTRAVENOUS
  Filled 2021-06-06: qty 1

## 2021-06-06 MED ORDER — INSULIN ASPART 100 UNIT/ML IJ SOLN
0.0000 [IU] | Freq: Three times a day (TID) | INTRAMUSCULAR | Status: DC
  Administered 2021-06-06: 11 [IU] via SUBCUTANEOUS
  Administered 2021-06-06: 15 [IU] via SUBCUTANEOUS
  Administered 2021-06-06: 7 [IU] via SUBCUTANEOUS
  Administered 2021-06-07: 15 [IU] via SUBCUTANEOUS
  Administered 2021-06-07 (×2): 7 [IU] via SUBCUTANEOUS
  Administered 2021-06-08: 15 [IU] via SUBCUTANEOUS
  Administered 2021-06-08: 4 [IU] via SUBCUTANEOUS
  Administered 2021-06-08: 15 [IU] via SUBCUTANEOUS
  Administered 2021-06-09: 7 [IU] via SUBCUTANEOUS
  Administered 2021-06-09: 3 [IU] via SUBCUTANEOUS
  Filled 2021-06-06: qty 0.2

## 2021-06-06 MED ORDER — ALPRAZOLAM 0.5 MG PO TABS
0.5000 mg | ORAL_TABLET | Freq: Once | ORAL | Status: AC
  Administered 2021-06-06: 0.5 mg via ORAL
  Filled 2021-06-06: qty 1

## 2021-06-06 MED ORDER — MONTELUKAST SODIUM 10 MG PO TABS
10.0000 mg | ORAL_TABLET | Freq: Every day | ORAL | Status: DC
  Administered 2021-06-06 – 2021-06-08 (×4): 10 mg via ORAL
  Filled 2021-06-06 (×5): qty 1

## 2021-06-06 MED ORDER — MELATONIN 3 MG PO TABS
3.0000 mg | ORAL_TABLET | Freq: Once | ORAL | Status: AC
  Administered 2021-06-06: 3 mg via ORAL
  Filled 2021-06-06: qty 1

## 2021-06-06 MED ORDER — ALPRAZOLAM 0.5 MG PO TABS
0.5000 mg | ORAL_TABLET | Freq: Two times a day (BID) | ORAL | Status: DC | PRN
  Administered 2021-06-06 (×2): 0.5 mg via ORAL
  Filled 2021-06-06 (×2): qty 1

## 2021-06-06 MED ORDER — LATANOPROST 0.005 % OP SOLN
1.0000 [drp] | Freq: Every day | OPHTHALMIC | Status: DC
  Administered 2021-06-07 – 2021-06-08 (×2): 1 [drp] via OPHTHALMIC
  Filled 2021-06-06: qty 2.5

## 2021-06-06 MED ORDER — IPRATROPIUM-ALBUTEROL 0.5-2.5 (3) MG/3ML IN SOLN
3.0000 mL | RESPIRATORY_TRACT | Status: DC | PRN
  Administered 2021-06-06 – 2021-06-08 (×4): 3 mL via RESPIRATORY_TRACT
  Filled 2021-06-06 (×6): qty 3

## 2021-06-06 MED ORDER — DIVALPROEX SODIUM 500 MG PO DR TAB
500.0000 mg | DELAYED_RELEASE_TABLET | Freq: Every day | ORAL | Status: DC
  Administered 2021-06-06 – 2021-06-08 (×3): 500 mg via ORAL
  Filled 2021-06-06 (×3): qty 1

## 2021-06-06 MED ORDER — ORAL CARE MOUTH RINSE
15.0000 mL | Freq: Two times a day (BID) | OROMUCOSAL | Status: DC
  Administered 2021-06-06 – 2021-06-09 (×8): 15 mL via OROMUCOSAL

## 2021-06-06 MED ORDER — AMOXICILLIN 500 MG PO CAPS
500.0000 mg | ORAL_CAPSULE | Freq: Two times a day (BID) | ORAL | Status: DC
  Administered 2021-06-06 (×2): 500 mg via ORAL
  Filled 2021-06-06 (×4): qty 1

## 2021-06-06 MED ORDER — RACEPINEPHRINE HCL 2.25 % IN NEBU
0.5000 mL | INHALATION_SOLUTION | Freq: Once | RESPIRATORY_TRACT | Status: AC | PRN
  Administered 2021-06-06: 0.5 mL via RESPIRATORY_TRACT
  Filled 2021-06-06: qty 0.5

## 2021-06-06 MED ORDER — CHLORHEXIDINE GLUCONATE CLOTH 2 % EX PADS
6.0000 | MEDICATED_PAD | Freq: Every day | CUTANEOUS | Status: DC
  Administered 2021-06-06 – 2021-06-08 (×4): 6 via TOPICAL

## 2021-06-06 MED ORDER — RACEPINEPHRINE HCL 2.25 % IN NEBU
0.5000 mL | INHALATION_SOLUTION | RESPIRATORY_TRACT | Status: AC | PRN
  Administered 2021-06-06 – 2021-06-08 (×2): 0.5 mL via RESPIRATORY_TRACT
  Filled 2021-06-06 (×3): qty 0.5

## 2021-06-06 MED ORDER — PREDNISONE 20 MG PO TABS
60.0000 mg | ORAL_TABLET | Freq: Every day | ORAL | Status: DC
  Administered 2021-06-06: 60 mg via ORAL
  Filled 2021-06-06: qty 3

## 2021-06-06 MED ORDER — MAGNESIUM SULFATE 2 GM/50ML IV SOLN
2.0000 g | Freq: Once | INTRAVENOUS | Status: AC
  Administered 2021-06-06: 2 g via INTRAVENOUS
  Filled 2021-06-06: qty 50

## 2021-06-06 MED ORDER — HYDRALAZINE HCL 20 MG/ML IJ SOLN
10.0000 mg | Freq: Four times a day (QID) | INTRAMUSCULAR | Status: DC | PRN
  Administered 2021-06-06 – 2021-06-08 (×3): 10 mg via INTRAVENOUS
  Filled 2021-06-06 (×4): qty 1

## 2021-06-06 MED ORDER — HYDRALAZINE HCL 50 MG PO TABS
100.0000 mg | ORAL_TABLET | Freq: Three times a day (TID) | ORAL | Status: DC
  Administered 2021-06-06 – 2021-06-08 (×8): 100 mg via ORAL
  Filled 2021-06-06: qty 4
  Filled 2021-06-06 (×7): qty 2

## 2021-06-06 MED ORDER — INSULIN ASPART 100 UNIT/ML IJ SOLN
0.0000 [IU] | Freq: Every day | INTRAMUSCULAR | Status: DC
  Administered 2021-06-06 (×2): 4 [IU] via SUBCUTANEOUS
  Administered 2021-06-07: 5 [IU] via SUBCUTANEOUS
  Administered 2021-06-08: 3 [IU] via SUBCUTANEOUS
  Filled 2021-06-06: qty 0.05

## 2021-06-06 NOTE — ED Notes (Addendum)
Will transport pt to 1236-1. AAOX4. Pt in no apparent distress or pain. The opportunity to ask questions was provided.

## 2021-06-06 NOTE — Progress Notes (Signed)
Bilateral lower extremity venous duplex has been completed. Preliminary results can be found in CV Proc through chart review.   06/06/21 9:58 AM [name] RVT

## 2021-06-06 NOTE — Plan of Care (Signed)
°  Problem: Education: Goal: Knowledge of General Education information will improve Description: Including pain rating scale, medication(s)/side effects and non-pharmacologic comfort measures Outcome: Progressing   Problem: Health Behavior/Discharge Planning: Goal: Ability to manage health-related needs will improve Outcome: Progressing   Problem: Clinical Measurements: Goal: Will remain free from infection Outcome: Progressing   Problem: Activity: Goal: Risk for activity intolerance will decrease Outcome: Progressing   Problem: Nutrition: Goal: Adequate nutrition will be maintained Outcome: Progressing   Problem: Coping: Goal: Level of anxiety will decrease Outcome: Progressing   Problem: Elimination: Goal: Will not experience complications related to bowel motility Outcome: Progressing   Problem: Elimination: Goal: Will not experience complications related to urinary retention Outcome: Progressing   Problem: Pain Managment: Goal: General experience of comfort will improve Outcome: Progressing   Problem: Safety: Goal: Ability to remain free from injury will improve Outcome: Progressing   Problem: Skin Integrity: Goal: Risk for impaired skin integrity will decrease Outcome: Progressing

## 2021-06-06 NOTE — Progress Notes (Signed)
[facility] Progress Note Patient Name: [name] DOB: 1951/04/03 MRN: [id number]   Date of Service  06/06/2021  HPI/Events of Note  RN and RT concerned about stridor and wants to repeat a dose of racemic epi? Discussed.  71 year old woman admitted to ICU with ? Asthma flare / vocal cord dysfunction giving acute wheezing. Getting xanax for anxiety.   VS: 183/77.  Sinus tachy. Not anxious, watching TV as per RN.  Gets prn hydralazine.  eICU Interventions  Racemic nebs once.  Melatonin ordered.   If worsening to call back.      Intervention Category Intermediate Interventions: Respiratory distress - evaluation and management  Elmer Sow 06/06/2021, 8:13 PM

## 2021-06-06 NOTE — ED Notes (Signed)
ED TO INPATIENT HANDOFF REPORT  ED Nurse Name and Phone #:   S Name/Age/Gender [name] 71 y.o. female Room/Bed: WA04/WA04  Code Status   Code Status: Full Code  Home/SNF/Other Home Patient oriented to: self, place, time, and situation Is this baseline? Yes   Triage Complete: Triage complete  Chief Complaint Asthma exacerbation [J45.901]  Triage Note Patient c/o SOB, expiratory wheezing/asthma. Patient states she has had 3 neb treatments today with no relief.  Room air sats-97%.   Allergies Allergies  Allergen Reactions   Chlorthalidone Other (See Comments)    Was stopped due to kidney function   Tramadol Other (See Comments)    Not to be taken because of existing seizure disorder/SEIZURES    Level of Care/Admitting Diagnosis ED Disposition     ED Disposition  Admit   Condition  --   [place]: [facility] [[id number]]  Level of Care: Stepdown [14]  Admit to SDU based on following criteria: Respiratory Distress:  Frequent assessment and/or intervention to maintain adequate ventilation/respiration, pulmonary toilet, and respiratory treatment.  May admit patient to [facility] or [facility] if equivalent level of care is available:: No  Covid Evaluation: Confirmed COVID Negative  Diagnosis: Asthma exacerbation [182993]  Admitting Physician: Collier Bullock [[id number]]  Attending Physician: Collier Bullock [[id number]]  Estimated length of stay: past midnight tomorrow  Certification:: I certify this patient will need inpatient services for at least 2 midnights          B Medical/Surgery History Past Medical History:  Diagnosis Date   Anxiety    Arthritis    Asthma    Chronic diastolic CHF (congestive heart failure) ([facility]) 07/17/2016   Coronary artery disease    a. NSTEMI 12/2015 - LHC 01/18/16: s/p overlapping DESx2 to RCA, 10% ost-prox Cx,10% mLAD.   Dementia ([facility])    Depression    Diabetes mellitus    Dyslipidemia     GERD (gastroesophageal reflux disease)    History of seizure    Hypertension    Hypertensive heart disease    NSTEMI (non-ST elevated myocardial infarction) ([facility]) 12/2015   Stage 3 chronic kidney disease (HCC)    Past Surgical History:  Procedure Laterality Date   ABDOMINAL HYSTERECTOMY     CARDIAC CATHETERIZATION  1994   minimal LAD dz, no other dz, EF normal   CARDIAC CATHETERIZATION N/A 01/18/2016   Procedure: Left Heart Cath and Coronary Angiography;  Surgeon: Burnell Blanks, MD;  Location: [facility];  Service: Cardiovascular;  Laterality: N/A;   CARDIAC CATHETERIZATION N/A 01/18/2016   Procedure: Coronary Stent Intervention;  Surgeon: Burnell Blanks, MD;  Location: [facility];  Service: Cardiovascular;  Laterality: N/A;   COLONOSCOPY WITH PROPOFOL N/A 04/09/2014   Procedure: COLONOSCOPY WITH PROPOFOL;  Surgeon: Cleotis Nipper, MD;  Location: [facility];  Service: Endoscopy;  Laterality: N/A;   CORONARY STENT PLACEMENT  01/19/2016   STENT SYNERGY DES [id number] drug eluting stent was successfully placed, and overlaps the 2.5 x 38 mm Synergy stent placed distally.   ESOPHAGOGASTRODUODENOSCOPY (EGD) WITH PROPOFOL N/A 04/09/2014   Procedure: ESOPHAGOGASTRODUODENOSCOPY (EGD) WITH PROPOFOL;  Surgeon: Cleotis Nipper, MD;  Location: [facility];  Service: Endoscopy;  Laterality: N/A;   FOOT SURGERY Bilateral    approx ~2000   HEMORRHOID SURGERY     HERNIA REPAIR     KNEE ARTHROSCOPY     RADIOACTIVE SEED GUIDED EXCISIONAL BREAST BIOPSY Left 01/20/2021   Procedure: RADIOACTIVE  SEED GUIDED EXCISIONAL LEFT BREAST BIOPSY;  Surgeon: Stark Klein, MD;  Location: [facility];  Service: General;  Laterality: Left;     A IV Location/Drains/Wounds Patient Lines/Drains/Airways Status     Active Line/Drains/Airways     Name Placement date Placement time Site Days   Peripheral IV 06/05/21 22 G Posterior;Right Hand 06/05/21  --  Hand  1   External Urinary  Catheter 06/05/21  2057  --  1   Incision (Closed) 01/20/21 Breast Left 01/20/21  1217  -- 137            Intake/Output Last 24 hours  Intake/Output Summary (Last 24 hours) at 06/06/2021 0051 Last data filed at 06/05/2021 2057 Gross per 24 hour  Intake 50 ml  Output --  Net 50 ml    Labs/Imaging Results for orders placed or performed during the hospital encounter of 06/05/21 (from the past 48 hour(s))  Comprehensive metabolic panel     Status: Abnormal   Collection Time: 06/05/21  6:06 PM  Result Value Ref Range   Sodium 133 (L) 135 - 145 mmol/L   Potassium 4.3 3.5 - 5.1 mmol/L   Chloride 103 98 - 111 mmol/L   CO2 20 (L) 22 - 32 mmol/L   Glucose, Bld 267 (H) 70 - 99 mg/dL    Comment: Glucose reference range applies only to samples taken after fasting for at least 8 hours.   BUN 20 8 - 23 mg/dL   Creatinine, Ser 1.04 (H) 0.44 - 1.00 mg/dL   Calcium 8.9 8.9 - 10.3 mg/dL   Total Protein 7.7 6.5 - 8.1 g/dL   Albumin 4.2 3.5 - 5.0 g/dL   AST 21 15 - 41 U/L   ALT 18 0 - 44 U/L   Alkaline Phosphatase 70 38 - 126 U/L   Total Bilirubin 0.3 0.3 - 1.2 mg/dL   GFR, Estimated 57 (L) >60 mL/min    Comment: (NOTE) Calculated using the CKD-EPI Creatinine Equation (2021)    Anion gap 10 5 - 15    Comment: Performed at [facility], [place] [address], [place], [place] [address]  CBC with Differential     Status: Abnormal   Collection Time: 06/05/21  6:06 PM  Result Value Ref Range   WBC 9.8 4.0 - 10.5 K/uL   RBC 3.27 (L) 3.87 - 5.11 MIL/uL   Hemoglobin 10.1 (L) 12.0 - 15.0 g/dL   HCT 32.0 (L) 36.0 - 46.0 %   MCV 97.9 80.0 - 100.0 fL   MCH 30.9 26.0 - 34.0 pg   MCHC 31.6 30.0 - 36.0 g/dL   RDW 14.8 11.5 - 15.5 %   Platelets 424 (H) 150 - 400 K/uL   nRBC 0.2 0.0 - 0.2 %   Neutrophils Relative % 83 %   Neutro Abs 8.1 (H) 1.7 - 7.7 K/uL   Lymphocytes Relative 12 %   Lymphs Abs 1.2 0.7 - 4.0 K/uL   Monocytes Relative 3 %   Monocytes Absolute 0.3 0.1 - 1.0 K/uL    Eosinophils Relative 0 %   Eosinophils Absolute 0.0 0.0 - 0.5 K/uL   Basophils Relative 1 %   Basophils Absolute 0.1 0.0 - 0.1 K/uL   Immature Granulocytes 1 %   Abs Immature Granulocytes 0.09 (H) 0.00 - 0.07 K/uL    Comment: Performed at [facility], [place] [address] [place], [place] [address]  Magnesium     Status: Abnormal   Collection Time: 06/05/21  6:06 PM  Result Value Ref Range   Magnesium 1.6 (L) 1.7 - 2.4 mg/dL    Comment: Performed at [facility], [place] [address], [place], [place] [address]  Procalcitonin - Baseline     Status: None   Collection Time: 06/05/21  6:06 PM  Result Value Ref Range   Procalcitonin <0.10 ng/mL    Comment:        Interpretation: PCT (Procalcitonin) <= 0.5 ng/mL: Systemic infection (sepsis) is not likely. Local bacterial infection is possible. (NOTE)       Sepsis PCT Algorithm           Lower Respiratory Tract                                      Infection PCT Algorithm    ----------------------------     ----------------------------         PCT < 0.25 ng/mL                PCT < 0.10 ng/mL          Strongly encourage             Strongly discourage   discontinuation of antibiotics    initiation of antibiotics    ----------------------------     -----------------------------       PCT 0.25 - 0.50 ng/mL            PCT 0.10 - 0.25 ng/mL               OR       >80% decrease in PCT            Discourage initiation of                                            antibiotics      Encourage discontinuation           of antibiotics    ----------------------------     -----------------------------         PCT >= 0.50 ng/mL              PCT 0.26 - 0.50 ng/mL               AND        <80% decrease in PCT             Encourage initiation of                                             antibiotics       Encourage continuation           of antibiotics    ----------------------------     -----------------------------         PCT >= 0.50 ng/mL                  PCT > 0.50 ng/mL               AND         increase in PCT                  Strongly encourage  initiation of antibiotics    Strongly encourage escalation           of antibiotics                                     -----------------------------                                           PCT <= 0.25 ng/mL                                                 OR                                        > 80% decrease in PCT                                      Discontinue / Do not initiate                                             antibiotics  Performed at [facility] [address] [place], [place] [address]   Resp Panel by RT-PCR (Flu A&B, Covid) Nasopharyngeal Swab     Status: None   Collection Time: 06/05/21  6:09 PM   Specimen: Nasopharyngeal Swab; Nasopharyngeal(NP) swabs in vial transport medium  Result Value Ref Range   SARS Coronavirus 2 by RT PCR NEGATIVE NEGATIVE    Comment: (NOTE) SARS-CoV-2 target nucleic acids are NOT DETECTED.  The SARS-CoV-2 RNA is generally detectable in upper respiratory specimens during the acute phase of infection. The lowest concentration of SARS-CoV-2 viral copies this assay can detect is 138 copies/mL. A negative result does not preclude SARS-Cov-2 infection and should not be used as the sole basis for treatment or other patient management decisions. A negative result may occur with  improper specimen collection/handling, submission of specimen other than nasopharyngeal swab, presence of viral mutation(s) within the areas targeted by this assay, and inadequate number of viral copies(<138 copies/mL). A negative result must be combined with clinical observations, patient history, and epidemiological information. The expected result is Negative.  Fact Sheet for Patients:  EntrepreneurPulse.com.au  Fact Sheet for Healthcare Providers:   IncredibleEmployment.be  This test is no t yet approved or cleared by the [place] FDA and  has been authorized for detection and/or diagnosis of SARS-CoV-2 by FDA under an Emergency Use Authorization (EUA). This EUA will remain  in effect (meaning this test can be used) for the duration of the COVID-19 declaration under Section 564(b)(1) of the Act, 21 U.S.C.section 360bbb-3(b)(1), unless the authorization is terminated  or revoked sooner.       Influenza A by PCR NEGATIVE NEGATIVE   Influenza B by PCR NEGATIVE NEGATIVE    Comment: (NOTE) The Xpert Xpress SARS-CoV-2/FLU/RSV plus assay is intended as an aid in the diagnosis of influenza from  Nasopharyngeal swab specimens and should not be used as a sole basis for treatment. Nasal washings and aspirates are unacceptable for Xpert Xpress SARS-CoV-2/FLU/RSV testing.  Fact Sheet for Patients: EntrepreneurPulse.com.au  Fact Sheet for Healthcare Providers: IncredibleEmployment.be  This test is not yet approved or cleared by the [place] FDA and has been authorized for detection and/or diagnosis of SARS-CoV-2 by FDA under an Emergency Use Authorization (EUA). This EUA will remain in effect (meaning this test can be used) for the duration of the COVID-19 declaration under Section 564(b)(1) of the Act, 21 U.S.C. section 360bbb-3(b)(1), unless the authorization is terminated or revoked.  Performed at [facility], [place] [address], [place], [place] [address]   Blood gas, venous     Status: Abnormal   Collection Time: 06/05/21 11:51 PM  Result Value Ref Range   pH, Ven 7.308 7.250 - 7.430   pCO2, Ven 36.3 (L) 44.0 - 60.0 mmHg   pO2, Ven 75.2 (H) 32.0 - 45.0 mmHg   Bicarbonate 17.6 (L) 20.0 - 28.0 mmol/L   Acid-base deficit 7.5 (H) 0.0 - 2.0 mmol/L   O2 Saturation 93.7 %   Patient temperature 98.6     Comment: Performed at [facility], [place] [address], [place], [place] [address]  Troponin I (High Sensitivity)     Status: None   Collection Time: 06/05/21 11:51 PM  Result Value Ref Range   Troponin I (High Sensitivity) 3 <18 ng/L    Comment: (NOTE) Elevated high sensitivity troponin I (hsTnI) values and significant  changes across serial measurements may suggest ACS but many other  chronic and acute conditions are known to elevate hsTnI results.  Refer to the "Links" section for chest pain algorithms and additional  guidance. Performed at [facility], [place] [address] [place], [place] [address]   Creatinine, serum     Status: None   Collection Time: 06/05/21 11:51 PM  Result Value Ref Range   Creatinine, Ser 0.99 0.44 - 1.00 mg/dL   GFR, Estimated >60 >60 mL/min    Comment: (NOTE) Calculated using the CKD-EPI Creatinine Equation (2021) Performed at [facility], [place] [address] [place], [place] [address]   CBG monitoring, ED     Status: Abnormal   Collection Time: 06/06/21 12:35 AM  Result Value Ref Range   Glucose-Capillary 314 (H) 70 - 99 mg/dL    Comment: Glucose reference range applies only to samples taken after fasting for at least 8 hours.   DG Chest 2 View  Result Date: 06/05/2021 CLINICAL DATA:  Shortness of breath EXAM: CHEST - 2 VIEW COMPARISON:  Chest radiograph dated May 13, 2021 FINDINGS: The heart size and mediastinal contours are within normal limits. Both lungs are clear. The visualized skeletal structures are unremarkable. IMPRESSION: No active cardiopulmonary disease. Electronically Signed   By: Keane Police D.O.   On: 06/05/2021 18:16    Pending Labs Unresulted Labs (From admission, onward)     Start     Ordered   06/12/21 0500  Creatinine, serum  (enoxaparin (LOVENOX)    CrCl >/= 30 ml/min)  Weekly,   R     Comments: while on enoxaparin therapy    06/05/21 2359   06/06/21 0500  Procalcitonin  Daily,   R      06/05/21 2232   06/06/21  0500  Magnesium  Tomorrow morning,   R        06/05/21 2233   06/06/21 0500  Basic  metabolic panel  Once-Timed,   TIMED        06/06/21 0001   06/06/21 0500  CBC with Differential/Platelet  Once-Timed,   TIMED        06/06/21 0001   06/06/21 0100  CBC  Once,   R        06/06/21 0100   06/06/21 0040  MRSA Next Gen by PCR, Nasal  Once,   R        06/06/21 0041   06/06/21 0001  Hemoglobin A1c  Once,   R       Comments: To assess prior glycemic control    06/06/21 0001   06/05/21 2232  Respiratory (~20 pathogens) panel by PCR  (Respiratory panel by PCR (~20 pathogens, ~24 hr TAT)  w precautions)  ONCE - STAT,   STAT        06/05/21 2232            Vitals/Pain Today's Vitals   06/05/21 2200 06/05/21 2300 06/06/21 0000 06/06/21 0031  BP: (!) 189/91 (!) 187/91 (!) 184/74 (!) 190/80  Pulse: (!) 116 (!) 126 (!) 122 (!) 121  Resp: 17 19 18 15   Temp:      TempSrc:      SpO2: 100% 98% 99% 98%  Weight:      Height:      PainSc:        Isolation Precautions Droplet precaution  Medications Medications  albuterol (VENTOLIN HFA) 108 (90 Base) MCG/ACT inhaler 2 puff (has no administration in time range)  docusate sodium (COLACE) capsule 100 mg (has no administration in time range)  polyethylene glycol (MIRALAX / GLYCOLAX) packet 17 g (has no administration in time range)  enoxaparin (LOVENOX) injection 40 mg (has no administration in time range)  famotidine (PEPCID) tablet 20 mg (has no administration in time range)  ondansetron (ZOFRAN) injection 4 mg (has no administration in time range)  insulin aspart (novoLOG) injection 0-20 Units (has no administration in time range)  insulin aspart (novoLOG) injection 0-5 Units (has no administration in time range)  oxyCODONE-acetaminophen (PERCOCET/ROXICET) 5-325 MG per tablet 1 tablet (1 tablet Oral Given 06/06/21 0031)  ALPRAZolam (XANAX) tablet 0.5 mg (has no administration in time range)  montelukast (SINGULAIR) tablet 10 mg (10 mg Oral  Given 06/06/21 0030)  budesonide (PULMICORT) nebulizer solution 0.5 mg (has no administration in time range)  ipratropium-albuterol (DUONEB) 0.5-2.5 (3) MG/3ML nebulizer solution 3 mL (has no administration in time range)  atorvastatin (LIPITOR) tablet 40 mg (has no administration in time range)  divalproex (DEPAKOTE) DR tablet 500 mg (has no administration in time range)  diltiazem (CARDIZEM SR) 12 hr capsule 120 mg (120 mg Oral Given 06/06/21 0031)  hydrALAZINE (APRESOLINE) tablet 100 mg (100 mg Oral Given 06/06/21 0031)  latanoprost (XALATAN) 0.005 % ophthalmic solution 1 drop (has no administration in time range)  memantine (NAMENDA) tablet 10 mg (has no administration in time range)  MEDLINE mouth rinse (has no administration in time range)  Chlorhexidine Gluconate Cloth 2 % PADS 6 each (has no administration in time range)  albuterol (PROVENTIL) (2.5 MG/3ML) 0.083% nebulizer solution (10 mg/hr Nebulization Given 06/05/21 1859)  methylPREDNISolone sodium succinate (SOLU-MEDROL) 125 mg/2 mL injection 125 mg (125 mg Intravenous Given 06/05/21 1825)  magnesium sulfate IVPB 2 g 50 mL (0 g Intravenous Stopped 06/05/21 2057)  Racepinephrine HCl 2.25 % nebulizer solution 0.5 mL (0.5 mLs Nebulization Given 06/05/21 2058)  albuterol (PROVENTIL) (2.5 MG/3ML) 0.083% nebulizer solution (10 mg/hr Nebulization Given  06/05/21 2133)    Mobility walks Low fall risk   Focused Assessments   R Recommendations: See Admitting Provider Note  Report given to:   Additional Notes:

## 2021-06-06 NOTE — Progress Notes (Addendum)
NAME:  [name], MRN:  [id number], DOB:  July 18, 1950, LOS: 0 ADMISSION DATE:  06/05/2021, CONSULTATION DATE:  06/05/20 REFERRING MD:  Eulis Foster , CHIEF COMPLAINT:  short of breath   History of Present Illness:  71 yo woman with hx of HFpEF, severe persistent asthma, Vocal cord dysfunction, CKD 3a, CAD, DM2, HTN, GERD, Depression, anxiety, HLD.  Per 11/22 note: treatment plan to maintain on performist, singular, budesonide.  Was hospitalized 1/13-1/14 with hypomag.  Plan for ENT referral. Brovana and pulmicort in house, not given steroids.   Seen on 05/17/21 in follow up by Dr. Melvyn Novas.  Reported she was hospitalized in Dec 22 with asthma flare (treated with standard asthma therapy per notes, mentioned need for ENT referral as OP, not seen by pulm per my review of notes).  Not quite returned to baseline per note.  Albuterol 4x per week.  Per note, taking Yupelri (Lama)/perforomist(LAMA)/singulair , though I dont see Yupelri on med list.    Called Pulmonary yesterday due to increased Wheezing and SOB, despite using ICS/LABA (formolerol and budesonide) and prn albuterol.  Instructed to start prednisone taper.   Per patient she has been coughing for 1 day.  Not improving despite home medications.  Per RT and ED, she was having expiratory wheezing sounds coming from upper airway prior to racemic epi.   She tells me she is often tachycardic after receiving albuterol Co chest discomfort, started in the ED.    IN ED: hypertensive, tachycardic after receiving albuterol.   Short of breath.  100% on RA.  Received mag 2g, albuterol nebs x2, Racemic epi nebs 2058, methylpred 125 x1 at 1825,   Pertinent  Medical History   HFpEF, severe persistent asthma, Vocal cord dysfunction, CKD 3a, CAD, DM2, HTN, GERD, Depression, anxiety, HLD.  Normal PFTs 2012 Frequent non-specific "respiratory flares" - cough, wheeze, SOB.    Home meds  Montelukast 10 Formoterol  Budesonide nebs  Yupelri??  Xanax0.5 BID prn   Lipitor 40  Budenonide nebs  Flexeril  Albuterol  Diltiazem 240 q day Depakote 500 qday  Nexium 40 BID Flonase BID Perforomist BID Hydralazine 100BID Xalatan 0.005% 1gtt both eyes qhs Namenda 10 mg BID Metfromin  Bystolic 10 daily    Allergies chlorthalidone and tramadol     Significant Hospital Events: Including procedures, antibiotic start and stop dates in addition to other pertinent events   2/5 presented to the ED with dyspnea and wheezing, admitted to ICU 2/6 100% on RA, still has upper airway wheezing  Interim History / Subjective:   Received racemic epi, solumedrol, duonebs Does not subjectively feel better, 100%   Objective   Blood pressure (!) 159/69, pulse 90, temperature 98 F (36.7 C), temperature source Oral, resp. rate 17, height 5\' 2"  (1.575 m), weight 79 kg, SpO2 100 %.        Intake/Output Summary (Last 24 hours) at 06/06/2021 2376 Last data filed at 06/06/2021 2831 Gross per 24 hour  Intake 290 ml  Output 200 ml  Net 90 ml   Filed Weights   06/05/21 1742 06/06/21 0151  Weight: 74.8 kg 79 kg   General:  well-nourished F, resting in bed no distress  HEENT: MM pink/moist, sclera anicteric, pupils equal Neuro: awake and alert, following commands, oriented and conversation CV: s1s2 rrr, no m/r/g PULM:  upper airway wheezing noted, decreased with conversation, no lower airway wheezing or distress GI: soft, bsx4 active  Extremities: warm/dry, 2+  edema, LLE TTP Skin: no rashes or lesions  Resolved Hospital Problem list     Assessment & Plan:    Acute respiratory distress, likely combination of chronic intermittent asthma, acid reflux and anxiety, possible vocal chord dysfunction Pt mentions that she was taken off Nexium last week by nephrology which she had been on for years, her husband was also diagnosed with recurrent gastric Ca and a stroke last week.  She has seen ENT at [facility] several years ago, though no Laryngoscopy done.  -viral  panel negative, Procal <0.1 -LE doppler pending, though nephrology took her off her "fluid pill" recently, so suspect edema is secondary to this, denies CP -continue solumedrol, Pepcid, pulmicort and duonebs -continue home Xanax, extra 0.5mg  dose this AM -Continue Prednisone 60mg  today and reduce to 40mg  tomorrow -pt is stable for transfer out of the ICU, likely needs one more day of monitoring and steroids -She has a scheduled outpatient ENT appointment in March, if not improving with rest and steroids consider inpatient    HFpEF Last echo with Grade II diastolic dysfunction and EF 65-70% Taken off Chlorthalidone for worsening renal function -Pt will need to follow up with nephrology and PCP, consider repeat echo -no evidence pulmonary edema on CXR -Lasix 20mg  x1 today   Type 2 DM -continue SSI, holding home Glipizide and Metformin     Acute on Chronic Kidney Injury Creatinine 1.3 today, up from baseline 0.8-1.1 Suspect 2/2 mild volume depletion in the setting of respiratory distress -continue to monitor UOP and renal indices, avoid nephrotoxins as able -has outpatient follow up    Chronic Hypomagnesemia Hospitalized recently with Mag down to 0.8, 2.0 today after repletion -mentions both constipation and diarrhea chronically, possibly secondary to GI losses -monitor and replete as needed     Best Practice (right click and "Reselect all SmartList Selections" daily)   Diet/type: Regular consistency (see ordeLMWHrs) DVT prophylaxis: LMWH GI prophylaxis: H2B Lines: N/A Foley:  N/A Code Status:  full code Last date of multidisciplinary goals of care discussion []   Labs   CBC: Recent Labs  Lab 06/05/21 1806 06/06/21 0202  WBC 9.8 9.6   9.6  NEUTROABS 8.1* 8.7*  HGB 10.1* 10.5*   10.4*  HCT 32.0* 32.9*   33.4*  MCV 97.9 99.1   100.3*  PLT 424* 389   485    Basic Metabolic Panel: Recent Labs  Lab 06/05/21 1806 06/05/21 2351 06/06/21 0636  NA 133*  --   131*  K 4.3  --  4.6  CL 103  --  101  CO2 20*  --  18*  GLUCOSE 267*  --  344*  BUN 20  --  22  CREATININE 1.04* 0.99 1.32*  CALCIUM 8.9  --  9.0  MG 1.6*  --  2.0   GFR: Estimated Creatinine Clearance: 37.5 mL/min (A) (by C-G formula based on SCr of 1.32 mg/dL (H)). Recent Labs  Lab 06/05/21 1806 06/06/21 0202 06/06/21 0636  PROCALCITON <0.10  --  <0.10  WBC 9.8 9.6   9.6  --     Liver Function Tests: Recent Labs  Lab 06/05/21 1806  AST 21  ALT 18  ALKPHOS 70  BILITOT 0.3  PROT 7.7  ALBUMIN 4.2   No results for input(s): LIPASE, AMYLASE in the last 168 hours. No results for input(s): AMMONIA in the last 168 hours.  ABG    Component Value Date/Time   PHART 7.382 08/12/2017 0550   PCO2ART 45.0 08/12/2017 0550   PO2ART 163.0 (H) 08/12/2017 0550  HCO3 17.6 (L) 06/05/2021 2351   TCO2 30 03/15/2018 1100   ACIDBASEDEF 7.5 (H) 06/05/2021 2351   O2SAT 93.7 06/05/2021 2351     Coagulation Profile: No results for input(s): INR, PROTIME in the last 168 hours.  Cardiac Enzymes: No results for input(s): CKTOTAL, CKMB, CKMBINDEX, TROPONINI in the last 168 hours.  HbA1C: Hgb A1c MFr Bld  Date/Time Value Ref Range Status  04/05/2021 12:22 AM 6.8 (H) 4.8 - 5.6 % Final    Comment:    (NOTE) Pre diabetes:          5.7%-6.4%  Diabetes:              >6.4%  Glycemic control for   <7.0% adults with diabetes   01/14/2021 11:32 AM 6.0 (H) 4.8 - 5.6 % Final    Comment:    (NOTE) Pre diabetes:          5.7%-6.4%  Diabetes:              >6.4%  Glycemic control for   <7.0% adults with diabetes     CBG: Recent Labs  Lab 06/06/21 0035  GLUCAP 314*    Review of Systems:   Please see the history of present illness. All other systems reviewed and are negative    Past Medical History:  She,  has a past medical history of Anxiety, Arthritis, Asthma, Chronic diastolic CHF (congestive heart failure) ([place]) (07/17/2016), Coronary artery disease, Dementia ([place]),  Depression, Diabetes mellitus, Dyslipidemia, GERD (gastroesophageal reflux disease), History of seizure, Hypertension, Hypertensive heart disease, NSTEMI (non-ST elevated myocardial infarction) ([place]) (12/2015), and Stage 3 chronic kidney disease ([place]).   Surgical History:   Past Surgical History:  Procedure Laterality Date   ABDOMINAL HYSTERECTOMY     CARDIAC CATHETERIZATION  1994   minimal LAD dz, no other dz, EF normal   CARDIAC CATHETERIZATION N/A 01/18/2016   Procedure: Left Heart Cath and Coronary Angiography;  Surgeon: Burnell Blanks, MD;  Location: [name] [facility];  Service: Cardiovascular;  Laterality: N/A;   CARDIAC CATHETERIZATION N/A 01/18/2016   Procedure: Coronary Stent Intervention;  Surgeon: Burnell Blanks, MD;  Location: [place] [facility];  Service: Cardiovascular;  Laterality: N/A;   COLONOSCOPY WITH PROPOFOL N/A 04/09/2014   Procedure: COLONOSCOPY WITH PROPOFOL;  Surgeon: Cleotis Nipper, MD;  Location: [facility];  Service: Endoscopy;  Laterality: N/A;   CORONARY STENT PLACEMENT  01/19/2016   STENT SYNERGY DES 7.35H29 drug eluting stent was successfully placed, and overlaps the 2.5 x 38 mm Synergy stent placed distally.   ESOPHAGOGASTRODUODENOSCOPY (EGD) WITH PROPOFOL N/A 04/09/2014   Procedure: ESOPHAGOGASTRODUODENOSCOPY (EGD) WITH PROPOFOL;  Surgeon: Cleotis Nipper, MD;  Location: [facility];  Service: Endoscopy;  Laterality: N/A;   FOOT SURGERY Bilateral    approx ~2000   HEMORRHOID SURGERY     HERNIA REPAIR     KNEE ARTHROSCOPY     RADIOACTIVE SEED GUIDED EXCISIONAL BREAST BIOPSY Left 01/20/2021   Procedure: RADIOACTIVE SEED GUIDED EXCISIONAL LEFT BREAST BIOPSY;  Surgeon: Stark Klein, MD;  Location: [place];  Service: General;  Laterality: Left;     Social History:   reports that she quit smoking about 17 years ago. Her smoking use included cigarettes. She has a 30.00 pack-year smoking history. She has never used smokeless tobacco. She  reports that she does not drink alcohol and does not use drugs.   Family History:  Her family history includes Allergies in her mother; Asthma in her mother; CAD (age  of onset: 34) in her father; CAD (age of onset: 60) in her sister; Congestive Heart Failure in her sister; Diabetes in her father; Hypertension in her father.   Allergies Allergies  Allergen Reactions   Chlorthalidone Other (See Comments)    Was stopped due to kidney function   Tramadol Other (See Comments)    Not to be taken because of existing seizure disorder/SEIZURES     Home Medications  Prior to Admission medications   Medication Sig Start Date End Date Taking? Authorizing Provider  albuterol (PROAIR HFA) 108 (90 Base) MCG/ACT inhaler Inhale 2 puffs into the lungs every 4 (four) hours as needed for wheezing or shortness of breath. 04/20/21  Yes Parrett, [name] S, NP  albuterol (PROVENTIL) (2.5 MG/3ML) 0.083% nebulizer solution Take 3 mLs (2.5 mg total) by nebulization every 4 (four) hours as needed for wheezing or shortness of breath. 05/17/21  Yes Tanda Rockers, MD  ALPRAZolam Duanne Moron) 0.5 MG tablet Take 1 tablet (0.5 mg total) by mouth 2 (two) times daily as needed for anxiety. Patient taking differently: Take 0.5 mg by mouth See admin instructions. Take 0.5 mg by mouth at bedtime and an additional 0.5 mg once a day as needed for anxiety 07/31/15  Yes Theodis Blaze, MD  amoxicillin (AMOXIL) 875 MG tablet Take 875 mg by mouth 2 (two) times daily. 06/02/21 06/12/21 Yes [provider]  atorvastatin (LIPITOR) 40 MG tablet Take 40 mg by mouth at bedtime.   Yes [provider]  Biotin 5000 MCG CAPS Take 5,000 mcg by mouth daily.   Yes [provider]  budesonide (PULMICORT) 0.5 MG/2ML nebulizer solution USE 1 VIAL IN NEBULIZER IN THE MORNING AND AT BEDTIME Patient taking differently: Take 0.5 mg by nebulization in the morning and at bedtime. 03/29/21  Yes Tanda Rockers, MD  cyclobenzaprine (FLEXERIL)  10 MG tablet Take 1 tablet (10 mg total) by mouth 2 (two) times daily as needed for muscle spasms. 05/15/21  Yes Isla Pence, MD  diltiazem (CARDIZEM CD) 240 MG 24 hr capsule Take 1 capsule (240 mg total) by mouth daily. 03/28/21  Yes Jerline Pain, MD  divalproex (DEPAKOTE ER) 500 MG 24 hr tablet Take 1 tablet (500 mg total) by mouth at bedtime. 03/15/21  Yes Marcial Pacas, MD  famotidine (PEPCID) 20 MG tablet Take 20 mg by mouth 2 (two) times daily before a meal.   Yes [provider]  fluticasone (FLONASE) 50 MCG/ACT nasal spray Place 2 sprays into both nostrils daily as needed for allergies.   Yes [provider]  formoterol (PERFOROMIST) 20 MCG/2ML nebulizer solution USE 1 VIAL  IN  NEBULIZER TWICE  DAILY - morning and evening Patient taking differently: 20 mcg 2 (two) times daily. 03/29/21  Yes Tanda Rockers, MD  glipiZIDE (GLUCOTROL) 5 MG tablet Take 5 mg by mouth daily before breakfast.   Yes [provider]  hydrALAZINE (APRESOLINE) 100 MG tablet Take 1 tablet (100 mg total) by mouth 3 (three) times daily. Please make yearly appt with Dr. Marlou Porch for April 2023 for future refills. Thank you 1st attempt Patient taking differently: Take 100 mg by mouth 3 (three) times daily. 06/01/21  Yes Jerline Pain, MD  latanoprost (XALATAN) 0.005 % ophthalmic solution Place 1 drop into both eyes at bedtime. 02/05/20  Yes [provider]  magnesium oxide (MAG-OX) 400 (240 Mg) MG tablet Take 1 tablet (400 mg total) by mouth daily. Patient taking differently: Take 400 mg by mouth  3 (three) times daily. 05/14/21  Yes Lavina Hamman, MD  memantine (NAMENDA) 10 MG tablet Take 1 tablet (10 mg total) by mouth 2 (two) times daily. Patient taking differently: Take 10 mg by mouth in the morning and at bedtime. 03/15/21  Yes Marcial Pacas, MD  metFORMIN (GLUCOPHAGE) 1000 MG tablet Take 1,000 mg by mouth 2 (two) times daily with a meal.   Yes [provider]  montelukast  (SINGULAIR) 10 MG tablet Take 1 tablet (10 mg total) by mouth at bedtime. 05/25/21  Yes Tanda Rockers, MD  Multiple Vitamin (MULTIVITAMIN WITH MINERALS) TABS tablet Take 1 tablet by mouth daily with breakfast.   Yes [provider]  nebivolol (BYSTOLIC) 10 MG tablet Take 1 tablet (10 mg total) by mouth daily. 04/10/19  Yes Jerline Pain, MD  nitroGLYCERIN (NITROSTAT) 0.4 MG SL tablet DISSOLVE ONE TABLET UNDER THE TONGUE EVERY 5 MINUTES AS NEEDED FOR CHEST PAIN.  DO NOT EXCEED A TOTAL OF 3 DOSES IN 15 MINUTES Patient taking differently: 0.4 mg every 5 (five) minutes as needed for chest pain. 11/19/19  Yes Burtis Junes, NP  oxyCODONE-acetaminophen (PERCOCET/ROXICET) 5-325 MG tablet Take 1 tablet by mouth every 6 (six) hours as needed (for pain). 06/04/21  Yes [provider]  potassium chloride SA (KLOR-CON M) 20 MEQ tablet Take 1 tablet (20 mEq total) by mouth every other day. 05/14/21  Yes Lavina Hamman, MD  predniSONE (DELTASONE) 10 MG tablet Take  4 each am x 2 days,   2 each am x 2 days,  1 each am x 2 days and stop Patient taking differently: Take 10-40 mg by mouth See admin instructions. Take 40 mg by mouth once daily on days 1 & 2, 20 mg once daily on days 3 & 4, and 10 mg once daily on days 5 & 6, then stop. 05/17/21  Yes Tanda Rockers, MD  Probiotic Product (EQ PROBIOTIC) CAPS Take 1 capsule by mouth every morning.   Yes [provider]     Critical care time: n/a     Otilio Carpen Calix Heinbaugh, PA-C [facility] See Amion for pager If no response to pager , please call [phone] until 7pm After 7:00 pm call Elink  [phone]

## 2021-06-06 NOTE — Progress Notes (Signed)
°  Transition of Care [facility]) Screening Note   Patient Details  Name: MERRILYN LEGLER Date of Birth: May 24, 1950   Transition of Care [facility]) CM/SW Contact:    Lennart Pall, LCSW Phone Number: 06/06/2021, 3:43 PM    Transition of Care Department [facility]) has reviewed patient and no TOC needs have been identified at this time. We will continue to monitor patient advancement through interdisciplinary progression rounds. If new patient transition needs arise, please place a TOC consult.

## 2021-06-06 NOTE — Telephone Encounter (Signed)
Called and spoke with the pt  She states currently admitted to the hospital due to asthma flare  I advised her to keep planned appt with MW 06/28/21 and call sooner if needed  Sending to MW as Juluis Rainier

## 2021-06-06 NOTE — Progress Notes (Signed)
eLink Physician-Brief Progress Note Patient Name: [name] DOB: [birth year] MRN: [id number]   Date of Service  06/06/2021  HPI/Events of Note  71 year old woman admitted to ICU with ? Asthma flare / vocal cord dysfunction giving acute wheezing. Currently on room air. PCCM admitting.   eICU Interventions  Nebs, steroids, anti anxiety medications. DVT prophylaxis. Call E link if needed.      Intervention Category Major Interventions: Respiratory failure - evaluation and management  [name] G Tarissa Kerin 06/06/2021, 1:54 AM

## 2021-06-07 ENCOUNTER — Inpatient Hospital Stay (HOSPITAL_COMMUNITY): Payer: Medicare Other

## 2021-06-07 LAB — BASIC METABOLIC PANEL
Anion gap: 10 (ref 5–15)
BUN: 38 mg/dL — ABNORMAL HIGH (ref 8–23)
CO2: 19 mmol/L — ABNORMAL LOW (ref 22–32)
Calcium: 9 mg/dL (ref 8.9–10.3)
Chloride: 103 mmol/L (ref 98–111)
Creatinine, Ser: 1.41 mg/dL — ABNORMAL HIGH (ref 0.44–1.00)
GFR, Estimated: 40 mL/min — ABNORMAL LOW (ref >60)
Glucose, Bld: 259 mg/dL — ABNORMAL HIGH (ref 70–99)
Potassium: 4.9 mmol/L (ref 3.5–5.1)
Sodium: 132 mmol/L — ABNORMAL LOW (ref 135–145)

## 2021-06-07 LAB — GLUCOSE, CAPILLARY
Glucose-Capillary: 206 mg/dL — ABNORMAL HIGH (ref 70–99)
Glucose-Capillary: 228 mg/dL — ABNORMAL HIGH (ref 70–99)
Glucose-Capillary: 331 mg/dL — ABNORMAL HIGH (ref 70–99)
Glucose-Capillary: 325 mg/dL — ABNORMAL HIGH (ref 70–99)

## 2021-06-07 LAB — CBC
HCT: 29.6 % — ABNORMAL LOW (ref 36.0–46.0)
Hemoglobin: 9.5 g/dL — ABNORMAL LOW (ref 12.0–15.0)
MCH: 31.8 pg (ref 26.0–34.0)
MCHC: 32.1 g/dL (ref 30.0–36.0)
MCV: 99 fL (ref 80.0–100.0)
Platelets: 362 10*3/uL (ref 150–400)
RBC: 2.99 MIL/uL — ABNORMAL LOW (ref 3.87–5.11)
RDW: 15.3 % (ref 11.5–15.5)
WBC: 19.6 10*3/uL — ABNORMAL HIGH (ref 4.0–10.5)
nRBC: 0.4 % — ABNORMAL HIGH (ref 0.0–0.2)

## 2021-06-07 LAB — MAGNESIUM: Magnesium: 2.6 mg/dL — ABNORMAL HIGH (ref 1.7–2.4)

## 2021-06-07 LAB — PROCALCITONIN: Procalcitonin: <0.1 ng/mL

## 2021-06-07 MED ORDER — ACETAMINOPHEN 325 MG PO TABS
650.0000 mg | ORAL_TABLET | Freq: Four times a day (QID) | ORAL | Status: DC | PRN
  Administered 2021-06-07 – 2021-06-09 (×3): 650 mg via ORAL
  Filled 2021-06-07 (×3): qty 2

## 2021-06-07 MED ORDER — SODIUM CHLORIDE 0.9 % IV BOLUS
500.0000 mL | Freq: Once | INTRAVENOUS | Status: AC
  Administered 2021-06-07: 500 mL via INTRAVENOUS

## 2021-06-07 MED ORDER — AMOXICILLIN-POT CLAVULANATE 875-125 MG PO TABS
1.0000 | ORAL_TABLET | Freq: Two times a day (BID) | ORAL | Status: DC
  Administered 2021-06-07 – 2021-06-09 (×5): 1 via ORAL
  Filled 2021-06-07 (×5): qty 1

## 2021-06-07 MED ORDER — INSULIN GLARGINE-YFGN 100 UNIT/ML ~~LOC~~ SOLN
10.0000 [IU] | Freq: Every day | SUBCUTANEOUS | Status: DC
  Administered 2021-06-07 – 2021-06-08 (×2): 10 [IU] via SUBCUTANEOUS
  Filled 2021-06-07 (×3): qty 0.1

## 2021-06-07 MED ORDER — LIDOCAINE HCL (PF) 2 % IJ SOLN
5.0000 mL | Freq: Once | INTRAMUSCULAR | Status: AC
  Administered 2021-06-07: 5 mL
  Filled 2021-06-07: qty 5

## 2021-06-07 MED ORDER — INSULIN GLARGINE-YFGN 100 UNIT/ML ~~LOC~~ SOLN
5.0000 [IU] | Freq: Every day | SUBCUTANEOUS | Status: DC
  Filled 2021-06-07: qty 0.05

## 2021-06-07 NOTE — Progress Notes (Signed)
NAME:  [name], MRN:  [id number], DOB:  February 19, 1951, LOS: 0 ADMISSION DATE:  06/05/2021, CONSULTATION DATE:  06/05/20 REFERRING MD:  Eulis Foster , CHIEF COMPLAINT:  short of breath   History of Present Illness:  71 yo woman with hx of HFpEF, severe persistent asthma, Vocal cord dysfunction, CKD 3a, CAD, DM2, HTN, GERD, Depression, anxiety, HLD.  Per 11/22 note: treatment plan to maintain on performist, singular, budesonide.  Was hospitalized 1/13-1/14 with hypomag.  Plan for ENT referral. Brovana and pulmicort in house, not given steroids.   Seen on 05/17/21 in follow up by Dr. Melvyn Novas.  Reported she was hospitalized in Dec 22 with asthma flare (treated with standard asthma therapy per notes, mentioned need for ENT referral as OP, not seen by pulm per my review of notes).  Not quite returned to baseline per note.  Albuterol 4x per week.  Per note, taking Yupelri (Lama)/perforomist(LAMA)/singulair ,  Yupelri not on med list.    Called Pulmonary yesterday due to increased Wheezing and SOB, despite using ICS/LABA (formolerol and budesonide) and prn albuterol.  Instructed to start prednisone taper.   Per patient she has been coughing for 1 day.  Not improving despite home medications.  Per RT and ED, she was having expiratory wheezing sounds coming from upper airway prior to racemic epi.   She tells me she is often tachycardic after receiving albuterol Co chest discomfort, started in the ED.    Pertinent  Medical History   HFpEF, severe persistent asthma, Vocal cord dysfunction, CKD 3a, CAD, DM2, HTN, GERD, Depression, anxiety, HLD.  Normal PFTs 2012 Frequent non-specific "respiratory flares" - cough, wheeze, SOB.    Home meds  Montelukast 10 Formoterol  Budesonide nebs  Yupelri??  Xanax0.5 BID prn  Lipitor 40  Budenonide nebs  Flexeril  Albuterol  Diltiazem 240 q day Depakote 500 qday  Nexium 40 BID Flonase BID Perforomist BID Hydralazine 100BID Xalatan 0.005% 1gtt both eyes qhs Namenda  10 mg BID Metfromin  Bystolic 10 daily    Allergies chlorthalidone and tramadol     Significant Hospital Events: Including procedures, antibiotic start and stop dates in addition to other pertinent events   2/5 presented to the ED with dyspnea and wheezing, admitted to ICU 2/6 100% on RA, still has upper airway wheezing 2/7 wheezing improved, remains on RA  Interim History / Subjective:   Remains on RA without hypoxia, speaking much easier today C/o worsening sore throat, family reports there is someone else in the family developing sore throat also More confused, sounds like had trouble sleeping overnight  Objective   Blood pressure (!) 160/75, pulse 86, temperature (!) 97.3 F (36.3 C), temperature source Oral, resp. rate 12, height 5\' 2"  (1.575 m), weight 78.2 kg, SpO2 99 %.        Intake/Output Summary (Last 24 hours) at 06/07/2021 0859 Last data filed at 06/07/2021 0800 Gross per 24 hour  Intake 134.98 ml  Output 900 ml  Net -765.02 ml    Filed Weights   06/05/21 1742 06/06/21 0151 06/07/21 0500  Weight: 74.8 kg 79 kg 78.2 kg    General:  well-nourished F, sitting up in bed and conversing HEENT: MM pink/moist, sclera anicteric, no obvious pharyngitis, pupils equal Neuro: awake, oriented to person and place, though anxious and more disoriented to situation, feels like the bed is moving when it is not, asking why there is a TV in the room, no focal deficits, moving all extremities CV: s1s2 rrr, no m/r/g  PULM:  comfortable without distress on RA, lower airways are clear, when sits forward mild expiratory upper airway wheezing again noted GI: soft, bsx4 active  Extremities: warm/dry, no edema  Skin: no rashes or lesions   Extended viral panel neg WBC increased to 19k Creatinine 1.4, BUN 38   Resolved Hospital Problem list     Assessment & Plan:    Acute respiratory distress, likely combination of chronic intermittent asthma, acid reflux and anxiety, possible  vocal chord dysfunction Acute encephalopathy, likely secondary to steroids Leukocytosis In the setting of recent dental work (root canal), taken off Nexium,  husband was also diagnosed with recurrent gastric Ca and had a stroke last week.    Saw ENT at [facility] several years ago, though no Laryngoscopy done.  Has outpatient ENT follow up 3/14 -her wheezing is improved today, continue prednisone 40mg  today, likely decrease tomorrow -change amoxicillin to augmentin in the setting of recent root canal, sore throat and rising WBC, though could be secondary to steroids -repeat CXR -continue Pepcid, pulmicort and duonebs -continue home Xanax -Continue Prednisone 60mg  today and reduce to 40mg  tomorrow -If not improving with rest and steroids consider inpatient ENT consult   HFpEF Last echo with Grade II diastolic dysfunction and EF 65-70% Taken off Chlorthalidone for worsening renal function -Pt will need to follow up with nephrology and PCP, consider repeat echo -no evidence pulmonary edema on CXR    Type 2 DM -continue SSI, holding home Glipizide and Metformin -glucose elevated to 344 this AM -add 5 units Semglee qhs     Acute on Chronic Kidney Injury Creatinine slightly up today Suspect 2/2 mild volume depletion in the setting of respiratory distress -500cc bolus -continue to monitor UOP and renal indices, avoid nephrotoxins as able -has outpatient follow up    Chronic Hypomagnesemia -monitor and replete as needed     Best Practice (right click and "Reselect all SmartList Selections" daily)   Diet/type: Regular consistency (see ordeLMWHrs) DVT prophylaxis: LMWH GI prophylaxis: H2B Lines: N/A Foley:  N/A Code Status:  full code Last date of multidisciplinary goals of care discussion []   Labs   CBC: Recent Labs  Lab 06/05/21 1806 06/06/21 0202 06/07/21 0315  WBC 9.8 9.6   9.6 19.6*  NEUTROABS 8.1* 8.7*  --   HGB 10.1* 10.5*   10.4* 9.5*  HCT 32.0* 32.9*    33.4* 29.6*  MCV 97.9 99.1   100.3* 99.0  PLT 424* 389   377 362     Basic Metabolic Panel: Recent Labs  Lab 06/05/21 1806 06/05/21 2351 06/06/21 0636 06/07/21 0315  NA 133*  --  131* 132*  K 4.3  --  4.6 4.9  CL 103  --  101 103  CO2 20*  --  18* 19*  GLUCOSE 267*  --  344* 259*  BUN 20  --  22 38*  CREATININE 1.04* 0.99 1.32* 1.41*  CALCIUM 8.9  --  9.0 9.0  MG 1.6*  --  2.0 2.6*    GFR: Estimated Creatinine Clearance: 34.9 mL/min (A) (by C-G formula based on SCr of 1.41 mg/dL (H)). Recent Labs  Lab 06/05/21 1806 06/06/21 0202 06/06/21 0636 06/07/21 0315  PROCALCITON <0.10  --  <0.10 <0.10  WBC 9.8 9.6   9.6  --  19.6*     Liver Function Tests: Recent Labs  Lab 06/05/21 1806  AST 21  ALT 18  ALKPHOS 70  BILITOT 0.3  PROT 7.7  ALBUMIN 4.2  No results for input(s): LIPASE, AMYLASE in the last 168 hours. No results for input(s): AMMONIA in the last 168 hours.  ABG    Component Value Date/Time   PHART 7.382 08/12/2017 0550   PCO2ART 45.0 08/12/2017 0550   PO2ART 163.0 (H) 08/12/2017 0550   HCO3 17.6 (L) 06/05/2021 2351   TCO2 30 03/15/2018 1100   ACIDBASEDEF 7.5 (H) 06/05/2021 2351   O2SAT 93.7 06/05/2021 2351      Coagulation Profile: No results for input(s): INR, PROTIME in the last 168 hours.  Cardiac Enzymes: No results for input(s): CKTOTAL, CKMB, CKMBINDEX, TROPONINI in the last 168 hours.  HbA1C: Hgb A1c MFr Bld  Date/Time Value Ref Range Status  06/05/2021 06:06 PM 8.2 (H) 4.8 - 5.6 % Final    Comment:    (NOTE) Pre diabetes:          5.7%-6.4%  Diabetes:              >6.4%  Glycemic control for   <7.0% adults with diabetes   04/05/2021 12:22 AM 6.8 (H) 4.8 - 5.6 % Final    Comment:    (NOTE) Pre diabetes:          5.7%-6.4%  Diabetes:              >6.4%  Glycemic control for   <7.0% adults with diabetes     CBG: Recent Labs  Lab 06/06/21 0841 06/06/21 1137 06/06/21 1654 06/06/21 2201 06/07/21 0752   GLUCAP 286* 208* 328* 338* 206*     Review of Systems:   Please see the history of present illness. All other systems reviewed and are negative    Past Medical History:  She,  has a past medical history of Anxiety, Arthritis, Asthma, Chronic diastolic CHF (congestive heart failure) ([place]) (07/17/2016), Coronary artery disease, Dementia ([place]), Depression, Diabetes mellitus, Dyslipidemia, GERD (gastroesophageal reflux disease), History of seizure, Hypertension, Hypertensive heart disease, NSTEMI (non-ST elevated myocardial infarction) ([place]) (12/2015), and Stage 3 chronic kidney disease ([place]).   Surgical History:   Past Surgical History:  Procedure Laterality Date   ABDOMINAL HYSTERECTOMY     CARDIAC CATHETERIZATION  1994   minimal LAD dz, no other dz, EF normal   CARDIAC CATHETERIZATION N/A 01/18/2016   Procedure: Left Heart Cath and Coronary Angiography;  Surgeon: Burnell Blanks, MD;  Location: [facility];  Service: Cardiovascular;  Laterality: N/A;   CARDIAC CATHETERIZATION N/A 01/18/2016   Procedure: Coronary Stent Intervention;  Surgeon: Burnell Blanks, MD;  Location: [facility];  Service: Cardiovascular;  Laterality: N/A;   COLONOSCOPY WITH PROPOFOL N/A 04/09/2014   Procedure: COLONOSCOPY WITH PROPOFOL;  Surgeon: Cleotis Nipper, MD;  Location: [facility];  Service: Endoscopy;  Laterality: N/A;   CORONARY STENT PLACEMENT  01/19/2016   STENT SYNERGY DES 2.87G81 drug eluting stent was successfully placed, and overlaps the 2.5 x 38 mm Synergy stent placed distally.   ESOPHAGOGASTRODUODENOSCOPY (EGD) WITH PROPOFOL N/A 04/09/2014   Procedure: ESOPHAGOGASTRODUODENOSCOPY (EGD) WITH PROPOFOL;  Surgeon: Cleotis Nipper, MD;  Location: [facility];  Service: Endoscopy;  Laterality: N/A;   FOOT SURGERY Bilateral    approx ~2000   HEMORRHOID SURGERY     HERNIA REPAIR     KNEE ARTHROSCOPY     RADIOACTIVE SEED GUIDED EXCISIONAL BREAST BIOPSY Left 01/20/2021    Procedure: RADIOACTIVE SEED GUIDED EXCISIONAL LEFT BREAST BIOPSY;  Surgeon: Stark Klein, MD;  Location: [facility];  Service: General;  Laterality: Left;  Social History:   reports that she quit smoking about 17 years ago. Her smoking use included cigarettes. She has a 30.00 pack-year smoking history. She has never used smokeless tobacco. She reports that she does not drink alcohol and does not use drugs.   Family History:  Her family history includes Allergies in her mother; Asthma in her mother; CAD (age of onset: 82) in her father; CAD (age of onset: 62) in her sister; Congestive Heart Failure in her sister; Diabetes in her father; Hypertension in her father.   Allergies Allergies  Allergen Reactions   Chlorthalidone Other (See Comments)    Was stopped due to kidney function   Tramadol Other (See Comments)    Not to be taken because of existing seizure disorder/SEIZURES     Home Medications  Prior to Admission medications   Medication Sig Start Date End Date Taking? Authorizing Provider  albuterol (PROAIR HFA) 108 (90 Base) MCG/ACT inhaler Inhale 2 puffs into the lungs every 4 (four) hours as needed for wheezing or shortness of breath. 04/20/21  Yes Parrett, [name] S, NP  albuterol (PROVENTIL) (2.5 MG/3ML) 0.083% nebulizer solution Take 3 mLs (2.5 mg total) by nebulization every 4 (four) hours as needed for wheezing or shortness of breath. 05/17/21  Yes Tanda Rockers, MD  ALPRAZolam Duanne Moron) 0.5 MG tablet Take 1 tablet (0.5 mg total) by mouth 2 (two) times daily as needed for anxiety. Patient taking differently: Take 0.5 mg by mouth See admin instructions. Take 0.5 mg by mouth at bedtime and an additional 0.5 mg once a day as needed for anxiety 07/31/15  Yes Theodis Blaze, MD  amoxicillin (AMOXIL) 875 MG tablet Take 875 mg by mouth 2 (two) times daily. 06/02/21 06/12/21 Yes [provider]  atorvastatin (LIPITOR) 40 MG tablet Take 40 mg by mouth at bedtime.   Yes [provider]  Biotin 5000 MCG CAPS Take 5,000 mcg by mouth daily.   Yes [provider]  budesonide (PULMICORT) 0.5 MG/2ML nebulizer solution USE 1 VIAL IN NEBULIZER IN THE MORNING AND AT BEDTIME Patient taking differently: Take 0.5 mg by nebulization in the morning and at bedtime. 03/29/21  Yes Tanda Rockers, MD  cyclobenzaprine (FLEXERIL) 10 MG tablet Take 1 tablet (10 mg total) by mouth 2 (two) times daily as needed for muscle spasms. 05/15/21  Yes Isla Pence, MD  diltiazem (CARDIZEM CD) 240 MG 24 hr capsule Take 1 capsule (240 mg total) by mouth daily. 03/28/21  Yes Jerline Pain, MD  divalproex (DEPAKOTE ER) 500 MG 24 hr tablet Take 1 tablet (500 mg total) by mouth at bedtime. 03/15/21  Yes Marcial Pacas, MD  famotidine (PEPCID) 20 MG tablet Take 20 mg by mouth 2 (two) times daily before a meal.   Yes [provider]  fluticasone (FLONASE) 50 MCG/ACT nasal spray Place 2 sprays into both nostrils daily as needed for allergies.   Yes [provider]  formoterol (PERFOROMIST) 20 MCG/2ML nebulizer solution USE 1 VIAL  IN  NEBULIZER TWICE  DAILY - morning and evening Patient taking differently: 20 mcg 2 (two) times daily. 03/29/21  Yes Tanda Rockers, MD  glipiZIDE (GLUCOTROL) 5 MG tablet Take 5 mg by mouth daily before breakfast.   Yes [provider]  hydrALAZINE (APRESOLINE) 100 MG tablet Take 1 tablet (100 mg total) by mouth 3 (three) times daily. Please make yearly appt with Dr. Marlou Porch for April 2023 for future refills. Thank you 1st attempt Patient taking differently:  Take 100 mg by mouth 3 (three) times daily. 06/01/21  Yes Jerline Pain, MD  latanoprost (XALATAN) 0.005 % ophthalmic solution Place 1 drop into both eyes at bedtime. 02/05/20  Yes [provider]  magnesium oxide (MAG-OX) 400 (240 Mg) MG tablet Take 1 tablet (400 mg total) by mouth daily. Patient taking differently: Take 400 mg by mouth 3 (three) times daily. 05/14/21  Yes Lavina Hamman, MD  memantine (NAMENDA) 10 MG tablet Take 1 tablet (10 mg total) by mouth 2 (two) times daily. Patient taking differently: Take 10 mg by mouth in the morning and at bedtime. 03/15/21  Yes Marcial Pacas, MD  metFORMIN (GLUCOPHAGE) 1000 MG tablet Take 1,000 mg by mouth 2 (two) times daily with a meal.   Yes [provider]  montelukast (SINGULAIR) 10 MG tablet Take 1 tablet (10 mg total) by mouth at bedtime. 05/25/21  Yes Tanda Rockers, MD  Multiple Vitamin (MULTIVITAMIN WITH MINERALS) TABS tablet Take 1 tablet by mouth daily with breakfast.   Yes [provider]  nebivolol (BYSTOLIC) 10 MG tablet Take 1 tablet (10 mg total) by mouth daily. 04/10/19  Yes Jerline Pain, MD  nitroGLYCERIN (NITROSTAT) 0.4 MG SL tablet DISSOLVE ONE TABLET UNDER THE TONGUE EVERY 5 MINUTES AS NEEDED FOR CHEST PAIN.  DO NOT EXCEED A TOTAL OF 3 DOSES IN 15 MINUTES Patient taking differently: 0.4 mg every 5 (five) minutes as needed for chest pain. 11/19/19  Yes Burtis Junes, NP  oxyCODONE-acetaminophen (PERCOCET/ROXICET) 5-325 MG tablet Take 1 tablet by mouth every 6 (six) hours as needed (for pain). 06/04/21  Yes [provider]  potassium chloride SA (KLOR-CON M) 20 MEQ tablet Take 1 tablet (20 mEq total) by mouth every other day. 05/14/21  Yes Lavina Hamman, MD  predniSONE (DELTASONE) 10 MG tablet Take  4 each am x 2 days,   2 each am x 2 days,  1 each am x 2 days and stop Patient taking differently: Take 10-40 mg by mouth See admin instructions. Take 40 mg by mouth once daily on days 1 & 2, 20 mg once daily on days 3 & 4, and 10 mg once daily on days 5 & 6, then stop. 05/17/21  Yes Tanda Rockers, MD  Probiotic Product (EQ PROBIOTIC) CAPS Take 1 capsule by mouth every morning.   Yes [provider]     Critical care time: n/a     Otilio Carpen Nnaemeka Samson, PA-C [facility] See Amion for pager If no response to pager , please call [phone] until 7pm After  7:00 pm call Elink  [phone]

## 2021-06-07 NOTE — Evaluation (Signed)
Speech Language Pathology Evaluation Patient Details Name: [name] MRN: [id number] DOB: [birth year] Today's Date: 06/07/2021 Time: 3662-9476 SLP Time Calculation (min) (ACUTE ONLY): 25 min  Problem List:  Patient Active Problem List   Diagnosis Date Noted   Respiratory distress    Hypomagnesemia 05/13/2021   Dental caries 04/20/2021   Abnormal swallowing 03/29/2021   Acute non-ST segment elevation myocardial infarction ([place]) 03/29/2021   Chronic diarrhea 03/29/2021   Daytime somnolence 03/29/2021   Abnormal mammogram of left breast 12/13/2020   Memory loss 03/20/2019   Severe persistent asthma without complication 54/65/0354   DM2 (diabetes mellitus, type 2) ([place]) 03/15/2018   Chronic kidney disease, stage 3a ([place]) 03/15/2018   Dyspnea 08/12/2017   Macrocytic anemia 08/12/2017   Therapeutic drug monitoring 05/17/2017   Lung nodule 08/01/2016   Chronic diastolic CHF (congestive heart failure) ([place]) 07/17/2016   Right leg swelling 02/27/2016   Uncontrolled type 2 diabetes mellitus with complication    Seizures ([place]) 01/27/2016   Numbness 01/27/2016   CAD in native artery 01/20/2016   Hypertensive heart disease 01/20/2016   NSTEMI (non-ST elevated myocardial infarction) ([place]) 01/18/2016   Dyspnea on exertion    Asthma exacerbation 07/21/2015   Diabetes mellitus type 2, controlled ([place] [facility]) 08/06/2014   Hyperlipidemia 08/06/2014   Allergic rhinitis 07/22/2008   Vocal cord dysfunction 07/22/2008   DYSPNEA 07/22/2008   Diabetes mellitus type 2, uncontrolled 07/21/2008   DIABETIC PERIPHERAL NEUROPATHY 07/21/2008   Depression with anxiety 07/21/2008   Essential hypertension 07/21/2008   Mild persistent chronic asthma without complication 65/68/1275   G E R D 07/21/2008   PULMONARY EMBOLISM, HX OF 07/21/2008   Past Medical History:  Past Medical History:  Diagnosis Date   Anxiety    Arthritis    Asthma    Chronic diastolic CHF (congestive heart failure) ([name]) 07/17/2016    Coronary artery disease    a. NSTEMI 12/2015 - LHC 01/18/16: s/p overlapping DESx2 to RCA, 10% ost-prox Cx,10% mLAD.   Dementia ([name])    Depression    Diabetes mellitus    Dyslipidemia    GERD (gastroesophageal reflux disease)    History of seizure    Hypertension    Hypertensive heart disease    NSTEMI (non-ST elevated myocardial infarction) ([name]) 12/2015   Stage 3 chronic kidney disease ([facility])    Past Surgical History:  Past Surgical History:  Procedure Laterality Date   ABDOMINAL HYSTERECTOMY     CARDIAC CATHETERIZATION  1994   minimal LAD dz, no other dz, EF normal   CARDIAC CATHETERIZATION N/A 01/18/2016   Procedure: Left Heart Cath and Coronary Angiography;  Surgeon: Burnell Blanks, MD;  Location: [facility];  Service: Cardiovascular;  Laterality: N/A;   CARDIAC CATHETERIZATION N/A 01/18/2016   Procedure: Coronary Stent Intervention;  Surgeon: Burnell Blanks, MD;  Location: [facility];  Service: Cardiovascular;  Laterality: N/A;   COLONOSCOPY WITH PROPOFOL N/A 04/09/2014   Procedure: COLONOSCOPY WITH PROPOFOL;  Surgeon: Cleotis Nipper, MD;  [facility];  Service: Endoscopy;  Laterality: N/A;   CORONARY STENT PLACEMENT  01/19/2016   STENT SYNERGY DES 1.70Y17 drug eluting stent was successfully placed, and overlaps the 2.5 x 38 mm Synergy stent placed distally.   ESOPHAGOGASTRODUODENOSCOPY (EGD) WITH PROPOFOL N/A 04/09/2014   Procedure: ESOPHAGOGASTRODUODENOSCOPY (EGD) WITH PROPOFOL;  Surgeon: Cleotis Nipper, MD;  [facility];  Service: Endoscopy;  Laterality: N/A;   FOOT SURGERY Bilateral    approx ~2000   HEMORRHOID  SURGERY     HERNIA REPAIR     KNEE ARTHROSCOPY     RADIOACTIVE SEED GUIDED EXCISIONAL BREAST BIOPSY Left 01/20/2021   Procedure: RADIOACTIVE SEED GUIDED EXCISIONAL LEFT BREAST BIOPSY;  Surgeon: Stark Klein, MD;  Location: [facility];  Service: General;  Laterality: Left;   HPI:  Patient is a 71 y.o. female  with PMH: severe persistent asthma, CKD 3a, vocal cord dysfunction, CAD, DM-2, HTN, GERD, depression, anxiety, HLD. She was recently hospitalized 1/13-1/14 with hypomag and plan for ENT referral. She was seen on 1/17 by Dr. Melvyn Novas for f/u appointment. Hospitalized in December of 2022 with asthma flare. Per RT in ED, patient was having expiratory wheezing sounds coming from upper airway and patient c/o chest discomfort. SLP evaluation ordered  to assess for any s/s of vocal cord dysfunction.   Assessment / Plan / Recommendation Clinical Impression  Patient presents with a mild-moderate voice impairment but with language, speech and cognition all appearing WFL. Patient's vocal quality is hoarse and during the course of conversation with SLP, this hoarseness increased. She did exhibit poor breath support for voicing and during tasked of sustained phonation of "ah", was only able to sustain for 4-5 seconds. SLP assessed s/z ratio maximum sustained /s/ of 2.8 seconds and maximum sustained /z/ of 2.89 seconds. S/Z ratio was .97 which is WFL (scores over 1.4 indicative of possible vocal cord dysfunction but is not statistically significant. As patient already has ENT appointment setup in march (she reported March 12 with Dr. Benjamine Mola), recommendation is to defer to ENT regarding any recommendations such as OP SLP evaluation for voice.    SLP Assessment  SLP Recommendation/Assessment: All further Speech Lanaguage Pathology  needs can be addressed in the next venue of care SLP Visit Diagnosis: Aphonia (R49.1);Dysarthria and anarthria (R47.1)    Recommendations for follow up therapy are one component of a multi-disciplinary discharge planning process, led by the attending physician.  Recommendations may be updated based on patient status, additional functional criteria and insurance authorization.    Follow Up Recommendations  Other (comment) (recommend OP ENT; OP SLP if needed following ENT eval of voice)     Assistance Recommended at Discharge  None  Functional Status Assessment Patient has had a recent decline in their functional status and demonstrates the ability to make significant improvements in function in a reasonable and predictable amount of time.  Frequency and Duration     N/A      SLP Evaluation Cognition  Overall Cognitive Status: Within Functional Limits for tasks assessed Orientation Level: Oriented X4       Comprehension  Auditory Comprehension Overall Auditory Comprehension: Appears within functional limits for tasks assessed    Expression Expression Primary Mode of Expression: Verbal Verbal Expression Overall Verbal Expression: Appears within functional limits for tasks assessed   Oral / Motor  Oral Motor/Sensory Function Overall Oral Motor/Sensory Function: Within functional limits Motor Speech Overall Motor Speech: Appears within functional limits for tasks assessed Respiration: Impaired Level of Impairment: Conversation Phonation: Hoarse Resonance: Within functional limits Articulation: Within functional limitis Intelligibility: Intelligible Motor Planning: Witnin functional limits Motor Speech Errors: Not applicable            Sonia Baller, MA, CCC-SLP Speech Therapy

## 2021-06-07 NOTE — Progress Notes (Signed)
[facility]  PROGRESS NOTE  BLAIKE NEWBURN UVO:[id number] DOB: Mar 09, 1951 DOA: 06/05/2021 PCP: [name], MD   Brief HPI:   71 year old female with severe persistent asthma, vocal cord dysfunction, CKD stage IIIa, GERD, diabetes mellitus type 2 who was admitted for acute asthma exacerbation versus vocal cord dysfunction.  Patient had root canal last Thursday and developed worsening shortness of breath, fullness in throat and wheezing on Friday that progressed throughout yesterday.  She was treated with doxycycline by her dentist and denies reaction to this medication.  Patient was admitted to ICU for closer observation. She has been transferred to [facility] care on 06/07/2021.    Subjective   Patient had episode of wheezing last night requiring racemic epinephrine nebulizer treatment.   Assessment/Plan:    Acute respiratory distress -Intermittent asthma versus vocal cord dysfunction -Treated with steroids; still having audible upper airway wheezing -PCCM following -Continue prednisone 40 mg daily -Continue Pepcid, DuoNeb, Pulmicort, Xanax as needed -Patient on antibiotics Augmentin; she recently had root canal procedure -Patient has outpatient appointment with Dr. Benjamine Mola on 07/12/2021 -If no improvement consider inpatient ENT consult for vocal cord dysfunction  HFpEF -Last echocardiogram showed grade 2 diastolic dysfunction with EF of 65 to 70% -She was recently taken off chlorthalidone due to worsening renal function -Chest x-ray obtained today shows left base airspace disease, either atelectasis or early infection -Patient is already on Augmentin as above  Diabetes mellitus type 2 -CBG elevated due to steroids -Continue sliding scale insulin NovoLog -Increase Lantus to 10 units subcu daily  Acute on chronic kidney disease -Creatinine at 1.41 today -Given IV fluid bolus per PCCM -Follow BMP in am  Hypertension -Blood pressure is elevated -Continue diltiazem,  hydralazine -Continue IV hydralazine as needed     Medications     amoxicillin-clavulanate  1 tablet Oral Q12H   atorvastatin  40 mg Oral Daily   budesonide (PULMICORT) nebulizer solution  0.5 mg Nebulization BID   Chlorhexidine Gluconate Cloth  6 each Topical Q2200   diltiazem  120 mg Oral Q12H   divalproex  500 mg Oral Daily   enoxaparin (LOVENOX) injection  40 mg Subcutaneous Q24H   famotidine  20 mg Oral BID   hydrALAZINE  100 mg Oral Q8H   insulin aspart  0-20 Units Subcutaneous TID WC   insulin aspart  0-5 Units Subcutaneous QHS   insulin glargine-yfgn  5 Units Subcutaneous QHS   latanoprost  1 drop Both Eyes QHS   mouth rinse  15 mL Mouth Rinse BID   memantine  10 mg Oral Daily   montelukast  10 mg Oral QHS   predniSONE  40 mg Oral Q breakfast     Data Reviewed:   CBG:  Recent Labs  Lab 06/06/21 1137 06/06/21 1654 06/06/21 2201 06/07/21 0752 06/07/21 1129  GLUCAP 208* 328* 338* 206* 228*    SpO2: 100 %    Vitals:   06/07/21 1133 06/07/21 1200 06/07/21 1300 06/07/21 1328  BP: (!) 186/98 (!) 157/66 (!) 161/85 (!) 145/60  Pulse: 93 91 91 87  Resp: 17 13 (!) 24 19  Temp: 97.8 F (36.6 C)     TempSrc: Oral     SpO2: 100% 100% 100% 100%  Weight:      Height:          Data Reviewed:  Basic Metabolic Panel: Recent Labs  Lab 06/05/21 1806 06/05/21 2351 06/06/21 0636 06/07/21 0315  NA 133*  --  131* 132*  K 4.3  --  4.6 4.9  CL 103  --  101 103  CO2 20*  --  18* 19*  GLUCOSE 267*  --  344* 259*  BUN 20  --  22 38*  CREATININE 1.04* 0.99 1.32* 1.41*  CALCIUM 8.9  --  9.0 9.0  MG 1.6*  --  2.0 2.6*    CBC: Recent Labs  Lab 06/05/21 1806 06/06/21 0202 06/07/21 0315  WBC 9.8 9.6   9.6 19.6*  NEUTROABS 8.1* 8.7*  --   HGB 10.1* 10.5*   10.4* 9.5*  HCT 32.0* 32.9*   33.4* 29.6*  MCV 97.9 99.1   100.3* 99.0  PLT 424* 389   377 362    LFT Recent Labs  Lab 06/05/21 1806  AST 21  ALT 18  ALKPHOS 70  BILITOT 0.3  PROT 7.7   ALBUMIN 4.2     Antibiotics: Anti-infectives (From admission, onward)    Start     Dose/Rate Route Frequency Ordered Stop   06/07/21 1000  amoxicillin-clavulanate (AUGMENTIN) 875-125 MG per tablet 1 tablet        1 tablet Oral Every 12 hours 06/07/21 0727     06/06/21 1200  amoxicillin (AMOXIL) capsule 500 mg  Status:  Discontinued       Note to Pharmacy: Continued from home meds for recent dental procedure   500 mg Oral Every 12 hours 06/06/21 1031 06/07/21 0727        DVT prophylaxis: Lovenox  Code Status: Full code  Family Communication: No family at bedside   CONSULTS PCCM   Objective    Physical Examination:   General-appears in no acute distress Heart-S1-S2, regular, no murmur auscultated Lungs-decreased breath sound bilaterally, audible upper airway wheezing noted Abdomen-soft, nontender, no organomegaly Extremities-no edema in the lower extremities Neuro-alert, oriented x3, no focal deficit noted  Status is: Inpatient vocal cord dysfunction        [place]   [facility] If 7PM-7AM, please contact night-coverage at [email or web address], Office  [phone]   06/07/2021, 2:51 PM  LOS: 2 days

## 2021-06-08 ENCOUNTER — Inpatient Hospital Stay (HOSPITAL_COMMUNITY): Payer: Medicare Other

## 2021-06-08 DIAGNOSIS — R061 Stridor: Secondary | ICD-10-CM

## 2021-06-08 DIAGNOSIS — J441 Chronic obstructive pulmonary disease with (acute) exacerbation: Secondary | ICD-10-CM

## 2021-06-08 DIAGNOSIS — R0603 Acute respiratory distress: Secondary | ICD-10-CM

## 2021-06-08 LAB — GLUCOSE, CAPILLARY
Glucose-Capillary: 164 mg/dL — ABNORMAL HIGH (ref 70–99)
Glucose-Capillary: 320 mg/dL — ABNORMAL HIGH (ref 70–99)
Glucose-Capillary: 309 mg/dL — ABNORMAL HIGH (ref 70–99)
Glucose-Capillary: 269 mg/dL — ABNORMAL HIGH (ref 70–99)

## 2021-06-08 MED ORDER — DILTIAZEM HCL ER COATED BEADS 120 MG PO CP24
240.0000 mg | ORAL_CAPSULE | Freq: Every day | ORAL | Status: DC
  Administered 2021-06-08 – 2021-06-09 (×2): 240 mg via ORAL
  Filled 2021-06-08 (×2): qty 2

## 2021-06-08 MED ORDER — IOHEXOL 300 MG/ML  SOLN
100.0000 mL | Freq: Once | INTRAMUSCULAR | Status: AC | PRN
  Administered 2021-06-08: 80 mL via INTRAVENOUS

## 2021-06-08 MED ORDER — MEMANTINE HCL 5 MG PO TABS
10.0000 mg | ORAL_TABLET | Freq: Two times a day (BID) | ORAL | Status: DC
  Administered 2021-06-08 – 2021-06-09 (×2): 10 mg via ORAL
  Filled 2021-06-08 (×2): qty 2

## 2021-06-08 MED ORDER — HYDRALAZINE HCL 50 MG PO TABS
100.0000 mg | ORAL_TABLET | Freq: Three times a day (TID) | ORAL | Status: DC
  Administered 2021-06-08 – 2021-06-09 (×4): 100 mg via ORAL
  Filled 2021-06-08 (×4): qty 2

## 2021-06-08 MED ORDER — MORPHINE SULFATE (PF) 2 MG/ML IV SOLN
1.0000 mg | INTRAVENOUS | Status: DC | PRN
  Administered 2021-06-08 (×2): 2 mg via INTRAVENOUS
  Administered 2021-06-09: 1 mg via INTRAVENOUS
  Filled 2021-06-08 (×3): qty 1

## 2021-06-08 MED ORDER — ATORVASTATIN CALCIUM 40 MG PO TABS
40.0000 mg | ORAL_TABLET | Freq: Every day | ORAL | Status: DC
  Administered 2021-06-08: 40 mg via ORAL
  Filled 2021-06-08: qty 1

## 2021-06-08 MED ORDER — PANTOPRAZOLE SODIUM 40 MG PO TBEC
40.0000 mg | DELAYED_RELEASE_TABLET | Freq: Two times a day (BID) | ORAL | Status: DC
  Administered 2021-06-08 – 2021-06-09 (×3): 40 mg via ORAL
  Filled 2021-06-08 (×3): qty 1

## 2021-06-08 MED ORDER — SODIUM CHLORIDE (PF) 0.9 % IJ SOLN
INTRAMUSCULAR | Status: AC
  Filled 2021-06-08: qty 50

## 2021-06-08 MED ORDER — DIVALPROEX SODIUM ER 250 MG PO TB24
500.0000 mg | ORAL_TABLET | Freq: Every day | ORAL | Status: DC
  Administered 2021-06-08: 500 mg via ORAL
  Filled 2021-06-08: qty 2

## 2021-06-08 MED ORDER — ALPRAZOLAM 0.5 MG PO TABS: 0.5000 mg | ORAL_TABLET | Freq: Two times a day (BID) | ORAL | Status: DC | PRN

## 2021-06-08 MED ORDER — NITROGLYCERIN 0.4 MG SL SUBL: 0.4000 mg | SUBLINGUAL_TABLET | SUBLINGUAL | Status: DC | PRN

## 2021-06-08 MED ORDER — CYCLOBENZAPRINE HCL 10 MG PO TABS: 10.0000 mg | ORAL_TABLET | Freq: Two times a day (BID) | ORAL | Status: DC | PRN

## 2021-06-08 MED ORDER — NEBIVOLOL HCL 10 MG PO TABS
10.0000 mg | ORAL_TABLET | Freq: Every day | ORAL | Status: DC
  Administered 2021-06-08 – 2021-06-09 (×2): 10 mg via ORAL
  Filled 2021-06-08 (×2): qty 1

## 2021-06-08 NOTE — Progress Notes (Signed)
NAME:  [name], MRN:  [id number], DOB:  May 19, 1950, LOS: 0 ADMISSION DATE:  06/05/2021, CONSULTATION DATE:  06/05/20 REFERRING MD:  Eulis Foster , CHIEF COMPLAINT:  short of breath   History of Present Illness:  71 yo woman with hx of HFpEF, severe persistent asthma, Vocal cord dysfunction, CKD 3a, CAD, DM2, HTN, GERD, Depression, anxiety, HLD.  Per 11/22 note: treatment plan to maintain on performist, singular, budesonide.  Was hospitalized 1/13-1/14 with hypomag.  Plan for ENT referral. Brovana and pulmicort in house, not given steroids.   Seen on 05/17/21 in follow up by Dr. Melvyn Novas.  Reported she was hospitalized in Dec 22 with asthma flare (treated with standard asthma therapy per notes, mentioned need for ENT referral as OP, not seen by pulm per my review of notes).  Not quite returned to baseline per note.  Albuterol 4x per week.  Per note, taking Yupelri (Lama)/perforomist(LAMA)/singulair ,  Yupelri not on med list.    Called Pulmonary yesterday due to increased Wheezing and SOB, despite using ICS/LABA (formolerol and budesonide) and prn albuterol.  Instructed to start prednisone taper.   Per patient she has been coughing for 1 day.  Not improving despite home medications.  Per RT and ED, she was having expiratory wheezing sounds coming from upper airway prior to racemic epi.   She tells me she is often tachycardic after receiving albuterol Co chest discomfort, started in the ED.    Pertinent  Medical History  HFpEF, severe persistent asthma, Vocal cord dysfunction, CKD 3a, CAD, DM2, HTN, GERD, Depression, anxiety, HLD.  Normal PFTs 2012 Frequent non-specific "respiratory flares" - cough, wheeze, SOB.    Home meds  Montelukast 10 Formoterol  Budesonide nebs  Yupelri??  Xanax0.5 BID prn  Lipitor 40  Budenonide nebs  Flexeril  Albuterol  Diltiazem 240 q day Depakote 500 qday  Nexium 40 BID Flonase BID Perforomist BID Hydralazine 100BID Xalatan 0.005% 1gtt both eyes qhs Namenda 10  mg BID Metfromin  Bystolic 10 daily    Allergies chlorthalidone and tramadol    Significant Hospital Events: Including procedures, antibiotic start and stop dates in addition to other pertinent events   2/5 presented to the ED with dyspnea and wheezing, admitted to ICU 2/6 100% on RA, still has upper airway wheezing 2/7 wheezing improved, remains on RA  Interim History / Subjective:  Continued upper airway wheeze worsens with mobility  Objective   Blood pressure (!) 153/69, pulse (!) 104, temperature 97.7 F (36.5 C), temperature source Oral, resp. rate 18, height 5\' 2"  (1.575 m), weight 77.6 kg, SpO2 98 %.        Intake/Output Summary (Last 24 hours) at 06/08/2021 1026 Last data filed at 06/08/2021 0900 Gross per 24 hour  Intake 800 ml  Output --  Net 800 ml   Filed Weights   06/06/21 0151 06/07/21 0500 06/08/21 0500  Weight: 79 kg 78.2 kg 77.6 kg    General: adult female sitting in chair  HEENT: MMM Neuro: alert, oriented, follows commands  CV: s1s2 rrr, no m/r/g PULM: lungs clear, upper airway with noted exp wheezing  GI: soft, bsx4 active  Extremities: warm/dry, no edema  Skin: no rashes or lesions  Resolved Hospital Problem list     Assessment & Plan:   Acute respiratory distress, likely combination of chronic intermittent asthma, acid reflux and anxiety, possible vocal cord dysfunction Acute encephalopathy, likely secondary to steroids Leukocytosis in setting of steroids  In the setting of recent dental work (root  canal), taken off Nexium,  husband was also diagnosed with recurrent gastric Ca and had a stroke last week.    Saw ENT at [facility] several years ago, though no Laryngoscopy done.  Has outpatient ENT follow up 3/14 Plan -Continue Prednisone  -Continue Augmentin  -change Pepcid to protonix, pulmicort and duonebs -continue home Xanax -Will Consult ENT. Previously seen in 2019-2020, ENT believed it to be secondary to acid reflux. Has continued to  have issues over last year with multiple admissions.    Best Practice (right click and "Reselect all SmartList Selections" daily)   Diet/type: Regular consistency (see ordeLMWHrs) DVT prophylaxis: LMWH GI prophylaxis: H2B Lines: N/A Foley:  N/A Code Status:  full code Last date of multidisciplinary goals of care discussion []   Labs   CBC: Recent Labs  Lab 06/05/21 1806 06/06/21 0202 06/07/21 0315  WBC 9.8 9.6   9.6 19.6*  NEUTROABS 8.1* 8.7*  --   HGB 10.1* 10.5*   10.4* 9.5*  HCT 32.0* 32.9*   33.4* 29.6*  MCV 97.9 99.1   100.3* 99.0  PLT 424* 389   377 161    Basic Metabolic Panel: Recent Labs  Lab 06/05/21 1806 06/05/21 2351 06/06/21 0636 06/07/21 0315  NA 133*  --  131* 132*  K 4.3  --  4.6 4.9  CL 103  --  101 103  CO2 20*  --  18* 19*  GLUCOSE 267*  --  344* 259*  BUN 20  --  22 38*  CREATININE 1.04* 0.99 1.32* 1.41*  CALCIUM 8.9  --  9.0 9.0  MG 1.6*  --  2.0 2.6*   GFR: Estimated Creatinine Clearance: 34.8 mL/min (A) (by C-G formula based on SCr of 1.41 mg/dL (H)). Recent Labs  Lab 06/05/21 1806 06/06/21 0202 06/06/21 0636 06/07/21 0315  PROCALCITON <0.10  --  <0.10 <0.10  WBC 9.8 9.6   9.6  --  19.6*    Liver Function Tests: Recent Labs  Lab 06/05/21 1806  AST 21  ALT 18  ALKPHOS 70  BILITOT 0.3  PROT 7.7  ALBUMIN 4.2   No results for input(s): LIPASE, AMYLASE in the last 168 hours. No results for input(s): AMMONIA in the last 168 hours.  ABG    Component Value Date/Time   PHART 7.382 08/12/2017 0550   PCO2ART 45.0 08/12/2017 0550   PO2ART 163.0 (H) 08/12/2017 0550   HCO3 17.6 (L) 06/05/2021 2351   TCO2 30 03/15/2018 1100   ACIDBASEDEF 7.5 (H) 06/05/2021 2351   O2SAT 93.7 06/05/2021 2351     Coagulation Profile: No results for input(s): INR, PROTIME in the last 168 hours.  Cardiac Enzymes: No results for input(s): CKTOTAL, CKMB, CKMBINDEX, TROPONINI in the last 168 hours.  HbA1C: Hgb A1c MFr Bld  Date/Time Value Ref  Range Status  06/05/2021 06:06 PM 8.2 (H) 4.8 - 5.6 % Final    Comment:    (NOTE) Pre diabetes:          5.7%-6.4%  Diabetes:              >6.4%  Glycemic control for   <7.0% adults with diabetes   04/05/2021 12:22 AM 6.8 (H) 4.8 - 5.6 % Final    Comment:    (NOTE) Pre diabetes:          5.7%-6.4%  Diabetes:              >6.4%  Glycemic control for   <7.0% adults with diabetes  CBG: Recent Labs  Lab 06/07/21 0752 06/07/21 1129 06/07/21 1617 06/07/21 2134 06/08/21 0755  GLUCAP 206* 228* 331* 325* 164*    Review of Systems:   Please see the history of present illness. All other systems reviewed and are negative    Past Medical History:  She,  has a past medical history of Anxiety, Arthritis, Asthma, Chronic diastolic CHF (congestive heart failure) ([place]) (07/17/2016), Coronary artery disease, Dementia ([place]), Depression, Diabetes mellitus, Dyslipidemia, GERD (gastroesophageal reflux disease), History of seizure, Hypertension, Hypertensive heart disease, NSTEMI (non-ST elevated myocardial infarction) ([place]) (12/2015), and Stage 3 chronic kidney disease ([place]).   Surgical History:   Past Surgical History:  Procedure Laterality Date   ABDOMINAL HYSTERECTOMY     CARDIAC CATHETERIZATION  1994   minimal LAD dz, no other dz, EF normal   CARDIAC CATHETERIZATION N/A 01/18/2016   Procedure: Left Heart Cath and Coronary Angiography;  Surgeon: Burnell Blanks, MD;  Location: [facility];  Service: Cardiovascular;  Laterality: N/A;   CARDIAC CATHETERIZATION N/A 01/18/2016   Procedure: Coronary Stent Intervention;  Surgeon: Burnell Blanks, MD;  Location: [facility];  Service: Cardiovascular;  Laterality: N/A;   COLONOSCOPY WITH PROPOFOL N/A 04/09/2014   Procedure: COLONOSCOPY WITH PROPOFOL;  Surgeon: Cleotis Nipper, MD;  Location: [facility];  Service: Endoscopy;  Laterality: N/A;   CORONARY STENT PLACEMENT  01/19/2016   STENT SYNERGY DES  2.77O24 drug eluting stent was successfully placed, and overlaps the 2.5 x 38 mm Synergy stent placed distally.   ESOPHAGOGASTRODUODENOSCOPY (EGD) WITH PROPOFOL N/A 04/09/2014   Procedure: ESOPHAGOGASTRODUODENOSCOPY (EGD) WITH PROPOFOL;  Surgeon: Cleotis Nipper, MD;  Location: [facility];  Service: Endoscopy;  Laterality: N/A;   FOOT SURGERY Bilateral    approx ~2000   HEMORRHOID SURGERY     HERNIA REPAIR     KNEE ARTHROSCOPY     RADIOACTIVE SEED GUIDED EXCISIONAL BREAST BIOPSY Left 01/20/2021   Procedure: RADIOACTIVE SEED GUIDED EXCISIONAL LEFT BREAST BIOPSY;  Surgeon: Stark Klein, MD;  Location: [facility];  Service: General;  Laterality: Left;     Social History:   reports that she quit smoking about 17 years ago. Her smoking use included cigarettes. She has a 30.00 pack-year smoking history. She has never used smokeless tobacco. She reports that she does not drink alcohol and does not use drugs.   Family History:  Her family history includes Allergies in her mother; Asthma in her mother; CAD (age of onset: 6) in her father; CAD (age of onset: 6) in her sister; Congestive Heart Failure in her sister; Diabetes in her father; Hypertension in her father.   Allergies Allergies  Allergen Reactions   Chlorthalidone Other (See Comments)    Was stopped due to kidney function   Tramadol Other (See Comments)    Not to be taken because of existing seizure disorder/SEIZURES     Home Medications  Prior to Admission medications   Medication Sig Start Date End Date Taking? Authorizing Provider  albuterol (PROAIR HFA) 108 (90 Base) MCG/ACT inhaler Inhale 2 puffs into the lungs every 4 (four) hours as needed for wheezing or shortness of breath. 04/20/21  Yes Parrett, [name] S, NP  albuterol (PROVENTIL) (2.5 MG/3ML) 0.083% nebulizer solution Take 3 mLs (2.5 mg total) by nebulization every 4 (four) hours as needed for wheezing or shortness of breath. 05/17/21  Yes Tanda Rockers, MD  ALPRAZolam  Duanne Moron) 0.5 MG tablet Take 1 tablet (0.5 mg total) by mouth 2 (two) times  daily as needed for anxiety. Patient taking differently: Take 0.5 mg by mouth See admin instructions. Take 0.5 mg by mouth at bedtime and an additional 0.5 mg once a day as needed for anxiety 07/31/15  Yes Theodis Blaze, MD  amoxicillin (AMOXIL) 875 MG tablet Take 875 mg by mouth 2 (two) times daily. 06/02/21 06/12/21 Yes [provider]  atorvastatin (LIPITOR) 40 MG tablet Take 40 mg by mouth at bedtime.   Yes [provider]  Biotin 5000 MCG CAPS Take 5,000 mcg by mouth daily.   Yes [provider]  budesonide (PULMICORT) 0.5 MG/2ML nebulizer solution USE 1 VIAL IN NEBULIZER IN THE MORNING AND AT BEDTIME Patient taking differently: Take 0.5 mg by nebulization in the morning and at bedtime. 03/29/21  Yes Tanda Rockers, MD  cyclobenzaprine (FLEXERIL) 10 MG tablet Take 1 tablet (10 mg total) by mouth 2 (two) times daily as needed for muscle spasms. 05/15/21  Yes Isla Pence, MD  diltiazem (CARDIZEM CD) 240 MG 24 hr capsule Take 1 capsule (240 mg total) by mouth daily. 03/28/21  Yes Jerline Pain, MD  divalproex (DEPAKOTE ER) 500 MG 24 hr tablet Take 1 tablet (500 mg total) by mouth at bedtime. 03/15/21  Yes Marcial Pacas, MD  famotidine (PEPCID) 20 MG tablet Take 20 mg by mouth 2 (two) times daily before a meal.   Yes [provider]  fluticasone (FLONASE) 50 MCG/ACT nasal spray Place 2 sprays into both nostrils daily as needed for allergies.   Yes [provider]  formoterol (PERFOROMIST) 20 MCG/2ML nebulizer solution USE 1 VIAL  IN  NEBULIZER TWICE  DAILY - morning and evening Patient taking differently: 20 mcg 2 (two) times daily. 03/29/21  Yes Tanda Rockers, MD  glipiZIDE (GLUCOTROL) 5 MG tablet Take 5 mg by mouth daily before breakfast.   Yes [provider]  hydrALAZINE (APRESOLINE) 100 MG tablet Take 1 tablet (100 mg total) by mouth 3 (three) times daily. Please make  yearly appt with Dr. Marlou Porch for April 2023 for future refills. Thank you 1st attempt Patient taking differently: Take 100 mg by mouth 3 (three) times daily. 06/01/21  Yes Jerline Pain, MD  latanoprost (XALATAN) 0.005 % ophthalmic solution Place 1 drop into both eyes at bedtime. 02/05/20  Yes [provider]  magnesium oxide (MAG-OX) 400 (240 Mg) MG tablet Take 1 tablet (400 mg total) by mouth daily. Patient taking differently: Take 400 mg by mouth 3 (three) times daily. 05/14/21  Yes Lavina Hamman, MD  memantine (NAMENDA) 10 MG tablet Take 1 tablet (10 mg total) by mouth 2 (two) times daily. Patient taking differently: Take 10 mg by mouth in the morning and at bedtime. 03/15/21  Yes Marcial Pacas, MD  metFORMIN (GLUCOPHAGE) 1000 MG tablet Take 1,000 mg by mouth 2 (two) times daily with a meal.   Yes [provider]  montelukast (SINGULAIR) 10 MG tablet Take 1 tablet (10 mg total) by mouth at bedtime. 05/25/21  Yes Tanda Rockers, MD  Multiple Vitamin (MULTIVITAMIN WITH MINERALS) TABS tablet Take 1 tablet by mouth daily with breakfast.   Yes [provider]  nebivolol (BYSTOLIC) 10 MG tablet Take 1 tablet (10 mg total) by mouth daily. 04/10/19  Yes Jerline Pain, MD  nitroGLYCERIN (NITROSTAT) 0.4 MG SL tablet DISSOLVE ONE TABLET UNDER THE TONGUE EVERY 5 MINUTES AS NEEDED FOR CHEST PAIN.  DO NOT EXCEED A TOTAL OF 3 DOSES IN 15 MINUTES Patient taking differently:  0.4 mg every 5 (five) minutes as needed for chest pain. 11/19/19  Yes Burtis Junes, NP  oxyCODONE-acetaminophen (PERCOCET/ROXICET) 5-325 MG tablet Take 1 tablet by mouth every 6 (six) hours as needed (for pain). 06/04/21  Yes [provider]  potassium chloride SA (KLOR-CON M) 20 MEQ tablet Take 1 tablet (20 mEq total) by mouth every other day. 05/14/21  Yes Lavina Hamman, MD  predniSONE (DELTASONE) 10 MG tablet Take  4 each am x 2 days,   2 each am x 2 days,  1 each am x 2 days and stop Patient taking  differently: Take 10-40 mg by mouth See admin instructions. Take 40 mg by mouth once daily on days 1 & 2, 20 mg once daily on days 3 & 4, and 10 mg once daily on days 5 & 6, then stop. 05/17/21  Yes Tanda Rockers, MD  Probiotic Product (EQ PROBIOTIC) CAPS Take 1 capsule by mouth every morning.   Yes [provider]     Hayden Pedro, AGACNP-BC [facility]  PCCM Pgr: [phone]

## 2021-06-08 NOTE — Consult Note (Signed)
Reason for Consult:Noisy breathing  Referring Physician: ICU NP  [name] is an 71 y.o. female.  HPI: I was called this morning to evaluate [name] who is a 71 year old female with noisy breathing.  She has been seen by ENT in the past and evaluated although I do not have access to those records.  The floor attempted to call her previous ENT, Vennie Homans, and he suggested they call me to come evaluate the patient.  The patient has had ongoing biphasic stridor for about a month now.  She has been maximally treated for reactive airway disease and they have seen no improvement in her noisy breathing.  To date she has not had a fine cut CT through the trachea or lungs.  She has not had a bronchoscopy.  The patient states that her previous evaluation resulted in a diagnosis of GERD and it was recommended that she take acid suppression medications.  She is continue taking those and her symptoms have persisted.  She has had multiple follow-up visits scheduled with her previous ENT, all of which she has missed.  Past Medical History:  Diagnosis Date   Anxiety    Arthritis    Asthma    Chronic diastolic CHF (congestive heart failure) ([place]) 07/17/2016   Coronary artery disease    a. NSTEMI 12/2015 - LHC 01/18/16: s/p overlapping DESx2 to RCA, 10% ost-prox Cx,10% mLAD.   Dementia ([place])    Depression    Diabetes mellitus    Dyslipidemia    GERD (gastroesophageal reflux disease)    History of seizure    Hypertension    Hypertensive heart disease    NSTEMI (non-ST elevated myocardial infarction) ([place]) 12/2015   Stage 3 chronic kidney disease (HCC)     Past Surgical History:  Procedure Laterality Date   ABDOMINAL HYSTERECTOMY     CARDIAC CATHETERIZATION  1994   minimal LAD dz, no other dz, EF normal   CARDIAC CATHETERIZATION N/A 01/18/2016   Procedure: Left Heart Cath and Coronary Angiography;  Surgeon: Burnell Blanks, MD;  Location: [facility];  Service: Cardiovascular;   Laterality: N/A;   CARDIAC CATHETERIZATION N/A 01/18/2016   Procedure: Coronary Stent Intervention;  Surgeon: Burnell Blanks, MD;  Location: [facility];  Service: Cardiovascular;  Laterality: N/A;   COLONOSCOPY WITH PROPOFOL N/A 04/09/2014   Procedure: COLONOSCOPY WITH PROPOFOL;  Surgeon: Cleotis Nipper, MD;  Location: [facility];  Service: Endoscopy;  Laterality: N/A;   CORONARY STENT PLACEMENT  01/19/2016   STENT SYNERGY DES [id number] drug eluting stent was successfully placed, and overlaps the 2.5 x 38 mm Synergy stent placed distally.   ESOPHAGOGASTRODUODENOSCOPY (EGD) WITH PROPOFOL N/A 04/09/2014   Procedure: ESOPHAGOGASTRODUODENOSCOPY (EGD) WITH PROPOFOL;  Surgeon: Cleotis Nipper, MD;  Location: [facility];  Service: Endoscopy;  Laterality: N/A;   FOOT SURGERY Bilateral    approx ~2000   HEMORRHOID SURGERY     HERNIA REPAIR     KNEE ARTHROSCOPY     RADIOACTIVE SEED GUIDED EXCISIONAL BREAST BIOPSY Left 01/20/2021   Procedure: RADIOACTIVE SEED GUIDED EXCISIONAL LEFT BREAST BIOPSY;  Surgeon: Stark Klein, MD;  Location: [facility];  Service: General;  Laterality: Left;    Family History  Problem Relation Age of Onset   Allergies Mother    Asthma Mother    CAD Father 17   Diabetes Father    Hypertension Father    Congestive Heart Failure Sister        died in her  66s   CAD Sister 64    Social History:  reports that she quit smoking about 17 years ago. Her smoking use included cigarettes. She has a 30.00 pack-year smoking history. She has never used smokeless tobacco. She reports that she does not drink alcohol and does not use drugs.  Allergies:  Allergies  Allergen Reactions   Chlorthalidone Other (See Comments)    Was stopped due to kidney function   Tramadol Other (See Comments)    Not to be taken because of existing seizure disorder/SEIZURES    Medications: I have reviewed the patient's current medications.  Results for orders placed or performed  during the hospital encounter of 06/05/21 (from the past 48 hour(s))  Glucose, capillary     Status: Abnormal   Collection Time: 06/06/21  4:54 PM  Result Value Ref Range   Glucose-Capillary 328 (H) 70 - 99 mg/dL    Comment: Glucose reference range applies only to samples taken after fasting for at least 8 hours.   Comment 1 Notify RN    Comment 2 Document in Chart   Glucose, capillary     Status: Abnormal   Collection Time: 06/06/21 10:01 PM  Result Value Ref Range   Glucose-Capillary 338 (H) 70 - 99 mg/dL    Comment: Glucose reference range applies only to samples taken after fasting for at least 8 hours.   Comment 1 Notify RN    Comment 2 Document in Chart   Procalcitonin     Status: None   Collection Time: 06/07/21  3:15 AM  Result Value Ref Range   Procalcitonin <0.10 ng/mL    Comment:        Interpretation: PCT (Procalcitonin) <= 0.5 ng/mL: Systemic infection (sepsis) is not likely. Local bacterial infection is possible. (NOTE)       Sepsis PCT Algorithm           Lower Respiratory Tract                                      Infection PCT Algorithm    ----------------------------     ----------------------------         PCT < 0.25 ng/mL                PCT < 0.10 ng/mL          Strongly encourage             Strongly discourage   discontinuation of antibiotics    initiation of antibiotics    ----------------------------     -----------------------------       PCT 0.25 - 0.50 ng/mL            PCT 0.10 - 0.25 ng/mL               OR       >80% decrease in PCT            Discourage initiation of                                            antibiotics      Encourage discontinuation           of antibiotics    ----------------------------     -----------------------------         PCT >=  0.50 ng/mL              PCT 0.26 - 0.50 ng/mL               AND        <80% decrease in PCT             Encourage initiation of                                             antibiotics        Encourage continuation           of antibiotics    ----------------------------     -----------------------------        PCT >= 0.50 ng/mL                  PCT > 0.50 ng/mL               AND         increase in PCT                  Strongly encourage                                      initiation of antibiotics    Strongly encourage escalation           of antibiotics                                     -----------------------------                                           PCT <= 0.25 ng/mL                                                 OR                                        > 80% decrease in PCT                                      Discontinue / Do not initiate                                             antibiotics  Performed at [facility] [address] [place], [place] [address]   CBC     Status: Abnormal   Collection Time: 06/07/21  3:15 AM  Result Value Ref Range   WBC 19.6 (H) 4.0 - 10.5 K/uL   RBC 2.99 (L) 3.87 - 5.11 MIL/uL   Hemoglobin 9.5 (L) 12.0 - 15.0 g/dL   HCT  29.6 (L) 36.0 - 46.0 %   MCV 99.0 80.0 - 100.0 fL   MCH 31.8 26.0 - 34.0 pg   MCHC 32.1 30.0 - 36.0 g/dL   RDW 15.3 11.5 - 15.5 %   Platelets 362 150 - 400 K/uL   nRBC 0.4 (H) 0.0 - 0.2 %    Comment: Performed at [facility], [place] [address] [place], [place] [address]  Basic metabolic panel     Status: Abnormal   Collection Time: 06/07/21  3:15 AM  Result Value Ref Range   Sodium 132 (L) 135 - 145 mmol/L   Potassium 4.9 3.5 - 5.1 mmol/L   Chloride 103 98 - 111 mmol/L   CO2 19 (L) 22 - 32 mmol/L   Glucose, Bld 259 (H) 70 - 99 mg/dL    Comment: Glucose reference range applies only to samples taken after fasting for at least 8 hours.   BUN 38 (H) 8 - 23 mg/dL   Creatinine, Ser 1.41 (H) 0.44 - 1.00 mg/dL   Calcium 9.0 8.9 - 10.3 mg/dL   GFR, Estimated 40 (L) >60 mL/min    Comment: (NOTE) Calculated using the CKD-EPI Creatinine Equation (2021)    Anion  gap 10 5 - 15    Comment: Performed at [facility], [place] [address], [place], [place] [address]  Magnesium     Status: Abnormal   Collection Time: 06/07/21  3:15 AM  Result Value Ref Range   Magnesium 2.6 (H) 1.7 - 2.4 mg/dL    Comment: Performed at [facility], [place] [address] [place], [place] [address]  Glucose, capillary     Status: Abnormal   Collection Time: 06/07/21  7:52 AM  Result Value Ref Range   Glucose-Capillary 206 (H) 70 - 99 mg/dL    Comment: Glucose reference range applies only to samples taken after fasting for at least 8 hours.   Comment 1 Notify RN    Comment 2 Document in Chart   Glucose, capillary     Status: Abnormal   Collection Time: 06/07/21 11:29 AM  Result Value Ref Range   Glucose-Capillary 228 (H) 70 - 99 mg/dL    Comment: Glucose reference range applies only to samples taken after fasting for at least 8 hours.   Comment 1 Notify RN    Comment 2 Document in Chart   Glucose, capillary     Status: Abnormal   Collection Time: 06/07/21  4:17 PM  Result Value Ref Range   Glucose-Capillary 331 (H) 70 - 99 mg/dL    Comment: Glucose reference range applies only to samples taken after fasting for at least 8 hours.   Comment 1 Notify RN    Comment 2 Document in Chart   Glucose, capillary     Status: Abnormal   Collection Time: 06/07/21  9:34 PM  Result Value Ref Range   Glucose-Capillary 325 (H) 70 - 99 mg/dL    Comment: Glucose reference range applies only to samples taken after fasting for at least 8 hours.  Glucose, capillary     Status: Abnormal   Collection Time: 06/08/21  7:55 AM  Result Value Ref Range   Glucose-Capillary 164 (H) 70 - 99 mg/dL    Comment: Glucose reference range applies only to samples taken after fasting for at least 8 hours.   Comment 1 Notify RN    Comment 2 Document in Chart     DG CHEST PORT 1 VIEW  Result Date: 06/07/2021  CLINICAL DATA:  Elevated white blood cell count EXAM: PORTABLE  CHEST 1 VIEW COMPARISON:  06/05/2021 FINDINGS: Numerous leads and wires project over the chest. Midline trachea. Normal heart size and mediastinal contours. No pleural effusion or pneumothorax. Left base airspace disease. Mild right infrahilar atelectasis. IMPRESSION: Left base airspace disease, either atelectasis or early infection. Electronically Signed   By: Abigail Miyamoto M.D.   On: 06/07/2021 08:21    Review of Systems Blood pressure (!) 153/69, pulse (!) 104, temperature 97.7 F (36.5 C), temperature source Oral, resp. rate 18, height 5\' 2"  (1.575 m), weight 77.6 kg, SpO2 98 %.  Physical Exam  On entry into the room, the patient is sleeping.  I was able to watch her breathe while asleep before she was aware I was in the room.  She exhibited no noisy breathing with laminar flow and a respiratory rate of 14.  As soon as she was awakened by me, she began having biphasic stridor.  Interestingly, she did not exhibit shortness of breath and was able to communicate in complete in full sentences during my encounter.  She did not appear anxious or in any distress during my visit.  Pupils equal round and reactive to light/extraocular movements intact Face atraumatic without bony step-offs or sinus tenderness Skin without rashes or suspicious lesions on the face Pinna normal without mastoid tenderness/no nystagmus No cervical adenopathy or salivary gland masses/trachea midline without thyroid nodule palpable Cranial nerves II through XII intact/no focal motor or sensory deficits noted/normal mood and affect Chest symmetric expansions bilaterally without use of accessory muscles Normal lips, normal tongue, normal soft palate Nasal mucosa dry and swollen/septum midline/no polyps or masses noted anteriorly Nasopharynx visualized using endoscopy and found to be normal and symmetric/no masses  FFOL - Flexible scope was passed through the left naris.  The nasopharynx was symmetric without mass or  abnormality.  The base of tongue was symmetric.  The lateral hypopharynx was symmetric and unremarkable.  The epiglottis was crisp.  The arytenoid towers were normal in appearance.  The true vocal cords moved normally during abduction and adduction.  She exhibited complete glottic closure with coughing.  The posterior interarytenoid space appeared normal.  The upper airway is widely patent without anything to explain her biphasic stridor.  The subglottis appeared normal although my exam was limited.   Assessment/Plan:  Stridor Subjective dyspnea while awake  I do not see an anatomical issue on examination of her upper airway and larynx that would explain the biphasic stridor she is exhibiting.  It is interesting that it only occurs while awake and when I am in the room.  While asleep she seems to have laminar flow without any stridor whatsoever.  This brings to mind the possibility of a functional voice disorder or spasmodic dysphonia.  The latter typically exhibits some additional findings which were not present.  Given that I cannot fully evaluate the subglottis and tracheobronchial tree, I will defer to the pulmonary team to rule out the cause for her symptoms in that region.  This may include a fine cut CT through her trachea and/or a bronchoscopy if necessary.    In the event that portion of her work-up is also normal, I would recommend an evaluation for spasmodic dysphonia by speech therapy.  As previously recommended, she should follow-up with ENT and speech therapy once discharged.  Marcina Millard 06/08/2021, 11:38 AM

## 2021-06-08 NOTE — Progress Notes (Signed)
RN called RT to patient bedside due to respiratory distress. Patient noted to have audible wheezing and stridor. Duoneb and Racepinephrine given. Patient SAT 100% on Room air, respirations 21-24, productive cough with thick white/tan secretions. Will continue to monitor.

## 2021-06-08 NOTE — Progress Notes (Signed)
[facility]  PROGRESS NOTE  [name] VOJ:[id number] DOB: 1950/05/03 DOA: 06/05/2021 PCP: [name], MD   Brief HPI:   71 year old female with severe persistent asthma, vocal cord dysfunction, CKD stage IIIa, GERD, diabetes mellitus type 2 who was admitted for acute asthma exacerbation versus vocal cord dysfunction.  Patient had root canal last Thursday and developed worsening shortness of breath, fullness in throat and wheezing on Friday that progressed throughout yesterday.  She was treated with doxycycline by her dentist and denies reaction to this medication.  Patient was admitted to ICU for closer observation. She has been transferred to [facility] care on 06/07/2021.    Subjective   No wheezing during my visit this morning but still occasionally intermittently happening   Assessment/Plan:    Acute respiratory distress -Intermittent asthma versus vocal cord dysfunction -Treated with steroids; still having audible upper airway wheezing -PCCM following -Continue prednisone 40 mg daily -Continue Pepcid, DuoNeb, Pulmicort, Xanax as needed -Patient on antibiotics Augmentin; she recently had root canal procedure -Patient has outpatient appointment with Dr. Benjamine Mola on 07/12/2021 -ENT consulted today -O2 sats normal during episodes  HFpEF -Last echocardiogram showed grade 2 diastolic dysfunction with EF of 65 to 70% -She was recently taken off chlorthalidone due to worsening renal function -Chest x-ray obtained today shows left base airspace disease, either atelectasis or early infection -Patient is already on Augmentin as above  Diabetes mellitus type 2 -CBG elevated due to steroids -Continue sliding scale insulin NovoLog -Increase Lantus to 10 units subcu daily  Acute on chronic kidney disease -Creatinine at 1.41 today -Given IV fluid bolus per PCCM -Follow BMP in am  Hypertension -Blood pressure is elevated -Continue diltiazem, hydralazine -Continue IV hydralazine  as needed     Medications     amoxicillin-clavulanate  1 tablet Oral Q12H   atorvastatin  40 mg Oral QHS   budesonide (PULMICORT) nebulizer solution  0.5 mg Nebulization BID   Chlorhexidine Gluconate Cloth  6 each Topical Q2200   diltiazem  240 mg Oral Daily   divalproex  500 mg Oral QHS   enoxaparin (LOVENOX) injection  40 mg Subcutaneous Q24H   famotidine  20 mg Oral BID   hydrALAZINE  100 mg Oral TID   insulin aspart  0-20 Units Subcutaneous TID WC   insulin aspart  0-5 Units Subcutaneous QHS   insulin glargine-yfgn  10 Units Subcutaneous QHS   latanoprost  1 drop Both Eyes QHS   mouth rinse  15 mL Mouth Rinse BID   memantine  10 mg Oral BID   montelukast  10 mg Oral QHS   nebivolol  10 mg Oral Daily   predniSONE  40 mg Oral Q breakfast     Data Reviewed:   CBG:  Recent Labs  Lab 06/07/21 0752 06/07/21 1129 06/07/21 1617 06/07/21 2134 06/08/21 0755  GLUCAP 206* 228* 331* 325* 164*    SpO2: 100 %    Vitals:   06/08/21 0815 06/08/21 0820 06/08/21 0835 06/08/21 0924  BP: (!) 175/76   (!) 143/66  Pulse:      Resp:    (!) 22  Temp:   97.7 F (36.5 C)   TempSrc:   Oral   SpO2:  100%    Weight:      Height:          Data Reviewed:  Basic Metabolic Panel: Recent Labs  Lab 06/05/21 1806 06/05/21 2351 06/06/21 0636 06/07/21 0315  NA 133*  --  131* 132*  K 4.3  --  4.6 4.9  CL 103  --  101 103  CO2 20*  --  18* 19*  GLUCOSE 267*  --  344* 259*  BUN 20  --  22 38*  CREATININE 1.04* 0.99 1.32* 1.41*  CALCIUM 8.9  --  9.0 9.0  MG 1.6*  --  2.0 2.6*    CBC: Recent Labs  Lab 06/05/21 1806 06/06/21 0202 06/07/21 0315  WBC 9.8 9.6   9.6 19.6*  NEUTROABS 8.1* 8.7*  --   HGB 10.1* 10.5*   10.4* 9.5*  HCT 32.0* 32.9*   33.4* 29.6*  MCV 97.9 99.1   100.3* 99.0  PLT 424* 389   377 362    LFT Recent Labs  Lab 06/05/21 1806  AST 21  ALT 18  ALKPHOS 70  BILITOT 0.3  PROT 7.7  ALBUMIN 4.2     Antibiotics: Anti-infectives (From  admission, onward)    Start     Dose/Rate Route Frequency Ordered Stop   06/07/21 1000  amoxicillin-clavulanate (AUGMENTIN) 875-125 MG per tablet 1 tablet        1 tablet Oral Every 12 hours 06/07/21 0727     06/06/21 1200  amoxicillin (AMOXIL) capsule 500 mg  Status:  Discontinued       Note to Pharmacy: Continued from home meds for recent dental procedure   500 mg Oral Every 12 hours 06/06/21 1031 06/07/21 0727        DVT prophylaxis: Lovenox  Code Status: Full code  Family Communication: No family at bedside   CONSULTS PCCM   Objective    Physical Examination:   General-appears in no acute distress Heart-S1-S2, regular, no murmur auscultated Lungs-decreased breath sound bilaterally, audible upper airway wheezing noted mild Abdomen-soft, nontender, no organomegaly Extremities-no edema in the lower extremities Neuro-alert, oriented x3, no focal deficit noted  Status is: Inpatient vocal cord dysfunction        [name] A    06/08/2021, 9:58 AM  LOS: 3 days

## 2021-06-08 NOTE — Progress Notes (Signed)
[facility] Progress Note Patient Name: LEOLIA VINZANT DOB: 12/12/1950 MRN: [id number]   Date of Service  06/08/2021  HPI/Events of Note  Patient complaining of chest pain, she was reportedly also having shortness of breath and audible wheezing but her saturation has stayed 99 % on room air, per bedside RN she has audible wheezes.  eICU Interventions  Morphine 1-2 mg iv ordered, PRN sub-lingual Nitroglycerin, cycle Troponin, patient has already received albuterol and racemic epinephrine breathing treatments        Stefen Juba U Asahd Can 06/08/2021, 3:10 AM

## 2021-06-08 NOTE — Progress Notes (Signed)
Noted pt is noted to be resting comfortable without signs of distress or discomfort.

## 2021-06-09 ENCOUNTER — Ambulatory Visit: Payer: Medicare Other | Admitting: Cardiology

## 2021-06-09 DIAGNOSIS — R061 Stridor: Secondary | ICD-10-CM

## 2021-06-09 LAB — GLUCOSE, CAPILLARY
Glucose-Capillary: 146 mg/dL — ABNORMAL HIGH (ref 70–99)
Glucose-Capillary: 226 mg/dL — ABNORMAL HIGH (ref 70–99)

## 2021-06-09 MED ORDER — PREDNISONE 20 MG PO TABS: 20.0000 mg | ORAL_TABLET | Freq: Every day | ORAL | 0 refills | Status: AC

## 2021-06-09 MED ORDER — PREDNISONE 10 MG PO TABS: ORAL_TABLET | ORAL | 11 refills | Status: DC

## 2021-06-09 MED ORDER — PREDNISONE 20 MG PO TABS: 20.0000 mg | ORAL_TABLET | Freq: Every day | ORAL | Status: DC

## 2021-06-09 MED ORDER — FLUTICASONE PROPIONATE 50 MCG/ACT NA SUSP
1.0000 | Freq: Every day | NASAL | Status: DC
  Administered 2021-06-09: 1 via NASAL
  Filled 2021-06-09: qty 16

## 2021-06-09 MED ORDER — AMOXICILLIN-POT CLAVULANATE 875-125 MG PO TABS: 1.0000 | ORAL_TABLET | Freq: Two times a day (BID) | ORAL | 0 refills | Status: AC

## 2021-06-09 NOTE — Discharge Summary (Signed)
Physician Discharge Summary  [name] LZJ:[id number] DOB: 1950/07/17 DOA: 06/05/2021  PCP: [name], MD  Admit date: 06/05/2021 Discharge date: 06/09/2021  Time spent: 35 minutes   Discharge Diagnoses:  Active Problems:   COPD exacerbation (HCC)   Asthma exacerbation   Respiratory distress   Biphasic stridor   Discharge Condition: Stable   Filed Weights   06/06/21 0151 06/07/21 0500 06/08/21 0500  Weight: 79 kg 78.2 kg 77.6 kg    History of present illness:  71 year old female with severe persistent asthma, vocal cord dysfunction, CKD stage IIIa, GERD, diabetes mellitus type 2 who was admitted for acute asthma exacerbation versus vocal cord dysfunction.  Patient had root canal last Thursday and developed worsening shortness of breath, fullness in throat and wheezing on Friday that progressed throughout yesterday.  She was treated with doxycycline by her dentist and denies reaction to this medication.  Patient was admitted to ICU for closer observation. She has been transferred to [facility] care on 06/07/2021.  During patient's episodes she had no desaturations.  Both pulmonology and ENT evaluated patient.  Working diagnosis of spasmodic dysphonia ENT recommending outpatient speech therapy evaluation and treatment.  Patient is being discharged in stable and improved condition with appropriate follow-up with PCP in 1 to 2 weeks and ENT as scheduled on July 12, 2021.  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 Benjamine Mola on 07/12/2021 -ENT consulted normal laryngoscopy -O2 sats normal during episodes -Spasmodic dysphonia needs outpatient speech therapy -Follow-up with primary  care physician in 1 to 2 weeks   HFpEF -Last echocardiogram showed grade 2 diastolic dysfunction with EF of 65 to 70% -She was recently taken off chlorthalidone due to worsening renal function -Chest x-ray obtained today shows left base airspace disease, either atelectasis or early infection -Patient is already on Augmentin as above   Diabetes mellitus type 2 -Resume home dosage   acute on chronic kidney disease -Resolved  Hypertension -Resume home meds      Discharge Exam: Vitals:   06/09/21 1100 06/09/21 1200  BP: (!) 209/80 (!) 182/78  Pulse: 81 79  Resp: (!) 22 (!) 24  Temp:  98 F (36.7 C)  SpO2: 100% 100%    General: Alert and oriented no apparent distress Cardiovascular: Regular rate rhythm without murmurs rubs or gallops Respiratory: Clear to auscultation bilaterally no wheezes rhonchi or rales  Discharge Instructions   Discharge Instructions     Diet - low sodium heart healthy   Complete by: As directed    Discharge instructions   Complete by: As directed    Follow-up with primary care physician in 1 to 2 weeks  Follow-up with ENT 4 to 6 weeks   Increase activity slowly   Complete by: As directed       Allergies as of 06/09/2021       Reactions   Chlorthalidone Other (See Comments)   Was stopped due to kidney function   Tramadol Other (See Comments)   Not to be taken because of existing seizure disorder/SEIZURES        Medication List     STOP taking these medications    amoxicillin 875 MG tablet Commonly known as: AMOXIL       TAKE these  medications    albuterol 108 (90 Base) MCG/ACT inhaler Commonly known as: ProAir HFA Inhale 2 puffs into the lungs every 4 (four) hours as needed for wheezing or shortness of breath.   albuterol (2.5 MG/3ML) 0.083% nebulizer solution Commonly known as: PROVENTIL Take 3 mLs (2.5 mg total) by nebulization every 4 (four) hours as needed for wheezing or shortness of breath.   ALPRAZolam 0.5 MG  tablet Commonly known as: XANAX Take 1 tablet (0.5 mg total) by mouth 2 (two) times daily as needed for anxiety. What changed:  when to take this additional instructions   amoxicillin-clavulanate 875-125 MG tablet Commonly known as: AUGMENTIN Take 1 tablet by mouth every 12 (twelve) hours for 5 days.   atorvastatin 40 MG tablet Commonly known as: LIPITOR Take 40 mg by mouth at bedtime.   Biotin 5000 MCG Caps Take 5,000 mcg by mouth daily.   budesonide 0.5 MG/2ML nebulizer solution Commonly known as: PULMICORT USE 1 VIAL IN NEBULIZER IN THE MORNING AND AT BEDTIME What changed: See the new instructions.   cyclobenzaprine 10 MG tablet Commonly known as: FLEXERIL Take 1 tablet (10 mg total) by mouth 2 (two) times daily as needed for muscle spasms.   diltiazem 240 MG 24 hr capsule Commonly known as: CARDIZEM CD Take 1 capsule (240 mg total) by mouth daily.   divalproex 500 MG 24 hr tablet Commonly known as: Depakote ER Take 1 tablet (500 mg total) by mouth at bedtime.   EQ Probiotic Caps Take 1 capsule by mouth every morning.   famotidine 20 MG tablet Commonly known as: PEPCID Take 20 mg by mouth 2 (two) times daily before a meal.   fluticasone 50 MCG/ACT nasal spray Commonly known as: FLONASE Place 2 sprays into both nostrils daily as needed for allergies.   formoterol 20 MCG/2ML nebulizer solution Commonly known as: Perforomist USE 1 VIAL  IN  NEBULIZER TWICE  DAILY - morning and evening What changed:  how much to take when to take this additional instructions   glipiZIDE 5 MG tablet Commonly known as: GLUCOTROL Take 5 mg by mouth daily before breakfast.   hydrALAZINE 100 MG tablet Commonly known as: APRESOLINE Take 1 tablet (100 mg total) by mouth 3 (three) times daily. Please make yearly appt with Dr. Marlou Porch for April 2023 for future refills. Thank you 1st attempt What changed: additional instructions   latanoprost 0.005 % ophthalmic solution Commonly  known as: XALATAN Place 1 drop into both eyes at bedtime.   magnesium oxide 400 (240 Mg) MG tablet Commonly known as: MAG-OX Take 1 tablet (400 mg total) by mouth daily. What changed: when to take this   memantine 10 MG tablet Commonly known as: NAMENDA Take 1 tablet (10 mg total) by mouth 2 (two) times daily. What changed: when to take this   metFORMIN 1000 MG tablet Commonly known as: GLUCOPHAGE Take 1,000 mg by mouth 2 (two) times daily with a meal.   montelukast 10 MG tablet Commonly known as: SINGULAIR Take 1 tablet (10 mg total) by mouth at bedtime.   multivitamin with minerals Tabs tablet Take 1 tablet by mouth daily with breakfast.   nebivolol 10 MG tablet Commonly known as: BYSTOLIC Take 1 tablet (10 mg total) by mouth daily.   nitroGLYCERIN 0.4 MG SL tablet Commonly known as: NITROSTAT DISSOLVE ONE TABLET UNDER THE TONGUE EVERY 5 MINUTES AS NEEDED FOR CHEST PAIN.  DO NOT EXCEED A TOTAL OF 3 DOSES IN 15 MINUTES What changed: See the  new instructions.   oxyCODONE-acetaminophen 5-325 MG tablet Commonly known as: PERCOCET/ROXICET Take 1 tablet by mouth every 6 (six) hours as needed (for pain).   potassium chloride SA 20 MEQ tablet Commonly known as: KLOR-CON M Take 1 tablet (20 mEq total) by mouth every other day.   predniSONE 20 MG tablet Commonly known as: DELTASONE Take 1 tablet (20 mg total) by mouth daily with breakfast for 5 days. Start taking on: June 10, 2021 What changed:  medication strength how much to take how to take this when to take this additional instructions       Allergies  Allergen Reactions   Chlorthalidone Other (See Comments)    Was stopped due to kidney function   Tramadol Other (See Comments)    Not to be taken because of existing seizure disorder/SEIZURES      The results of significant diagnostics from this hospitalization (including imaging, microbiology, ancillary and laboratory) are listed below for reference.     Significant Diagnostic Studies: DG Chest 2 View  Result Date: 06/05/2021 CLINICAL DATA:  Shortness of breath EXAM: CHEST - 2 VIEW COMPARISON:  Chest radiograph dated May 13, 2021 FINDINGS: The heart size and mediastinal contours are within normal limits. Both lungs are clear. The visualized skeletal structures are unremarkable. IMPRESSION: No active cardiopulmonary disease. Electronically Signed   By: Keane Police D.O.   On: 06/05/2021 18:16   DG Chest 2 View  Result Date: 05/13/2021 CLINICAL DATA:  Wheezing. EXAM: CHEST - 2 VIEW COMPARISON:  Chest x-ray dated May 03, 2021. FINDINGS: The heart size and mediastinal contours are within normal limits. Both lungs are clear. The visualized skeletal structures are unremarkable. IMPRESSION: No active cardiopulmonary disease. Electronically Signed   By: Titus Dubin M.D.   On: 05/13/2021 11:09   CT SOFT TISSUE NECK W CONTRAST  Result Date: 06/08/2021 CLINICAL DATA:  Epiglottitis or tonsillitis suspected. Tightness for 2 months. Cough for 2 months EXAM: CT NECK WITH CONTRAST TECHNIQUE: Multidetector CT imaging of the neck was performed using the standard protocol following the bolus administration of intravenous contrast. RADIATION DOSE REDUCTION: This exam was performed according to the departmental dose-optimization program which includes automated exposure control, adjustment of the mA and/or kV according to patient size and/or use of iterative reconstruction technique. CONTRAST:  75mL OMNIPAQUE IOHEXOL 300 MG/ML  SOLN COMPARISON:  None. FINDINGS: Pharynx and larynx: Wasting of the pharynx due to medialization of the carotids. No mucosal based mass or swelling noted. Salivary glands: No inflammation, mass, or stone. Thyroid: Normal. Lymph nodes: None enlarged or abnormal density. Vascular: Carotid atherosclerosis and tortuosity. Limited intracranial: Negative Visualized orbits: Negative Mastoids and visualized paranasal sinuses: Clear Skeleton:  Multilevel cervical disc space narrowing and ridging. Upper chest: Clear apical lungs. IMPRESSION: No evidence of mass or inflammation. Electronically Signed   By: Jorje Guild M.D.   On: 06/08/2021 19:16   CT Chest High Resolution  Result Date: 06/08/2021 CLINICAL DATA:  Evaluate for tracheobronchial malacia. EXAM: CT CHEST WITHOUT CONTRAST TECHNIQUE: Multidetector CT imaging of the chest was performed following the standard protocol without intravenous contrast. High resolution imaging of the lungs, as well as inspiratory and expiratory imaging, was performed. RADIATION DOSE REDUCTION: This exam was performed according to the departmental dose-optimization program which includes automated exposure control, adjustment of the mA and/or kV according to patient size and/or use of iterative reconstruction technique. COMPARISON:  Multiple priors, most recent dated August 03, 2016 FINDINGS: Cardiovascular: No pericardial effusion. Left main and three-vessel coronary  artery calcifications. Mild atherosclerotic disease of the thoracic aorta. Mediastinum/Nodes: Esophagus and thyroid are unremarkable. No pathologically enlarged lymph nodes seen in the chest. Lungs/Pleura: Central airways are patent. Mild bilateral air trapping. No pathologically enlarged lymph nodes seen in the chest. Expiratory phase imaging demonstrates approximately 50% decrease in cross-sectional area of the tracheobronchial lumen. Bibasilar atelectasis. No consolidation, pleural effusion or pneumothorax. Solid pulmonary nodule of the right upper lobe measuring 4 mm on series 10, image 48, new when compared with 2018 prior exam. Upper Abdomen: No acute abnormality. Musculoskeletal: No chest wall mass or suspicious bone lesions identified. IMPRESSION: 1. 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 2. Mild bilateral air  trapping, which can be seen in setting of small airways disease. 3. Solid pulmonary nodule of the right upper lobe measuring 4 mm which is new when compared with 2018 prior. No follow-up needed if patient is low-risk. Non-contrast chest CT can be considered in 12 months if patient is high-risk. This recommendation follows the consensus statement: Guidelines for Management of Incidental Pulmonary Nodules Detected on CT Images: From the Fleischner Society 2017; Radiology 2017; 284:228-243. Electronically Signed   By: Yetta Glassman M.D.   On: 06/08/2021 16:27   DG CHEST PORT 1 VIEW  Result Date: 06/07/2021 CLINICAL DATA:  Elevated white blood cell count EXAM: PORTABLE CHEST 1 VIEW COMPARISON:  06/05/2021 FINDINGS: Numerous leads and wires project over the chest. Midline trachea. Normal heart size and mediastinal contours. No pleural effusion or pneumothorax. Left base airspace disease. Mild right infrahilar atelectasis. IMPRESSION: Left base airspace disease, either atelectasis or early infection. Electronically Signed   By: Abigail Miyamoto M.D.   On: 06/07/2021 08:21   VAS [place] LOWER EXTREMITY VENOUS (DVT)  Result Date: 06/06/2021  Lower Venous DVT Study Patient Name:  PATRISIA FAETH  Date of Exam:   06/06/2021 Medical Rec #: [id number]       Accession #:    [id number] Date of Birth: 05-07-1950        Patient Gender: F Patient Age:   59 years Exam Location:  [facility] Procedure:      VAS [place] LOWER EXTREMITY VENOUS (DVT) Referring Phys: Collier Bullock --------------------------------------------------------------------------------  Indications: Edema.  Risk Factors: None identified. Limitations: Poor ultrasound/tissue interface and body habitus. Comparison Study: No prior studies. Performing Technologist: Oliver Hum RVT  Examination Guidelines: A complete evaluation includes B-mode imaging, spectral Doppler, color Doppler, and power Doppler as needed of all accessible portions of each vessel. Bilateral  testing is considered an integral part of a complete examination. Limited examinations for reoccurring indications may be performed as noted. The reflux portion of the exam is performed with the patient in reverse Trendelenburg.  +---------+---------------+---------+-----------+----------+--------------+  RIGHT     Compressibility Phasicity Spontaneity Properties Thrombus Aging  +---------+---------------+---------+-----------+----------+--------------+  CFV       Full            Yes       Yes                                    +---------+---------------+---------+-----------+----------+--------------+  SFJ       Full                                                             +---------+---------------+---------+-----------+----------+--------------+  FV Prox   Full                                                             +---------+---------------+---------+-----------+----------+--------------+  FV Mid    Full                                                             +---------+---------------+---------+-----------+----------+--------------+  FV Distal                 Yes       Yes                                    +---------+---------------+---------+-----------+----------+--------------+  PFV       Full                                                             +---------+---------------+---------+-----------+----------+--------------+  POP       Full            Yes       Yes                                    +---------+---------------+---------+-----------+----------+--------------+  PTV       Full                                                             +---------+---------------+---------+-----------+----------+--------------+  PERO      Full                                                             +---------+---------------+---------+-----------+----------+--------------+   +---------+---------------+---------+-----------+----------+--------------+  LEFT       Compressibility Phasicity Spontaneity Properties Thrombus Aging  +---------+---------------+---------+-----------+----------+--------------+  CFV       Full            Yes       Yes                                    +---------+---------------+---------+-----------+----------+--------------+  SFJ       Full                                                             +---------+---------------+---------+-----------+----------+--------------+  FV Prox   Full                                                             +---------+---------------+---------+-----------+----------+--------------+  FV Mid    Full                                                             +---------+---------------+---------+-----------+----------+--------------+  FV Distal Full                                                             +---------+---------------+---------+-----------+----------+--------------+  PFV       Full                                                             +---------+---------------+---------+-----------+----------+--------------+  POP       Full            Yes       Yes                                    +---------+---------------+---------+-----------+----------+--------------+  PTV       Full                                                             +---------+---------------+---------+-----------+----------+--------------+  PERO      Full                                                             +---------+---------------+---------+-----------+----------+--------------+     Summary: RIGHT: - There is no evidence of deep vein thrombosis in the lower extremity. However, portions of this examination were limited- see technologist comments above.  - No cystic structure found in the popliteal fossa.  LEFT: - There is no evidence of deep vein thrombosis in the lower extremity. However, portions of this examination were limited- see technologist comments above.  - No cystic structure found in the popliteal  fossa.  *See table(s) above for measurements and observations. Electronically signed by Orlie Pollen on 06/06/2021 at 7:53:30 PM.    Final     Microbiology: Recent Results (from the past 240 hour(s))  Resp Panel by RT-PCR (Flu A&B, Covid) Nasopharyngeal Swab     Status: None  Collection Time: 06/05/21  6:09 PM   Specimen: Nasopharyngeal Swab; Nasopharyngeal(NP) swabs in vial transport medium  Result Value Ref Range Status   SARS Coronavirus 2 by RT PCR NEGATIVE NEGATIVE Final    Comment: (NOTE) SARS-CoV-2 target nucleic acids are NOT DETECTED.  The SARS-CoV-2 RNA is generally detectable in upper respiratory specimens during the acute phase of infection. The lowest concentration of SARS-CoV-2 viral copies this assay can detect is 138 copies/mL. A negative result does not preclude SARS-Cov-2 infection and should not be used as the sole basis for treatment or other patient management decisions. A negative result may occur with  improper specimen collection/handling, submission of specimen other than nasopharyngeal swab, presence of viral mutation(s) within the areas targeted by this assay, and inadequate number of viral copies(<138 copies/mL). A negative result must be combined with clinical observations, patient history, and epidemiological information. The expected result is Negative.  Fact Sheet for Patients:  EntrepreneurPulse.com.au  Fact Sheet for Healthcare Providers:  IncredibleEmployment.be  This test is no t yet approved or cleared by the [place] FDA and  has been authorized for detection and/or diagnosis of SARS-CoV-2 by FDA under an Emergency Use Authorization (EUA). This EUA will remain  in effect (meaning this test can be used) for the duration of the COVID-19 declaration under Section 564(b)(1) of the Act, 21 U.S.C.section 360bbb-3(b)(1), unless the authorization is terminated  or revoked sooner.       Influenza A by PCR  NEGATIVE NEGATIVE Final   Influenza B by PCR NEGATIVE NEGATIVE Final    Comment: (NOTE) The Xpert Xpress SARS-CoV-2/FLU/RSV plus assay is intended as an aid in the diagnosis of influenza from Nasopharyngeal swab specimens and should not be used as a sole basis for treatment. Nasal washings and aspirates are unacceptable for Xpert Xpress SARS-CoV-2/FLU/RSV testing.  Fact Sheet for Patients: EntrepreneurPulse.com.au  Fact Sheet for Healthcare Providers: IncredibleEmployment.be  This test is not yet approved or cleared by the [place] FDA and has been authorized for detection and/or diagnosis of SARS-CoV-2 by FDA under an Emergency Use Authorization (EUA). This EUA will remain in effect (meaning this test can be used) for the duration of the COVID-19 declaration under Section 564(b)(1) of the Act, 21 U.S.C. section 360bbb-3(b)(1), unless the authorization is terminated or revoked.  Performed at [facility], [place] [address], [place], [place] [address]   Respiratory (~20 pathogens) panel by PCR     Status: None   Collection Time: 06/05/21 11:51 PM   Specimen: Nasopharyngeal Swab; Respiratory  Result Value Ref Range Status   Adenovirus NOT DETECTED NOT DETECTED Final   Coronavirus 229E NOT DETECTED NOT DETECTED Final    Comment: (NOTE) The Coronavirus on the Respiratory Panel, DOES NOT test for the novel  Coronavirus (2019 nCoV)    Coronavirus HKU1 NOT DETECTED NOT DETECTED Final   Coronavirus NL63 NOT DETECTED NOT DETECTED Final   Coronavirus OC43 NOT DETECTED NOT DETECTED Final   Metapneumovirus NOT DETECTED NOT DETECTED Final   Rhinovirus / Enterovirus NOT DETECTED NOT DETECTED Final   Influenza A NOT DETECTED NOT DETECTED Final   Influenza B NOT DETECTED NOT DETECTED Final   Parainfluenza Virus 1 NOT DETECTED NOT DETECTED Final   Parainfluenza Virus 2 NOT DETECTED NOT DETECTED Final   Parainfluenza Virus 3 NOT  DETECTED NOT DETECTED Final   Parainfluenza Virus 4 NOT DETECTED NOT DETECTED Final   Respiratory Syncytial Virus NOT DETECTED NOT DETECTED Final   Bordetella pertussis NOT DETECTED NOT  DETECTED Final   Bordetella Parapertussis NOT DETECTED NOT DETECTED Final   Chlamydophila pneumoniae NOT DETECTED NOT DETECTED Final   Mycoplasma pneumoniae NOT DETECTED NOT DETECTED Final    Comment: Performed at [facility], [place] [address] [place], [place] [address]  MRSA Next Gen by PCR, Nasal     Status: None   Collection Time: 06/06/21  1:30 AM   Specimen: Nasal Mucosa; Nasal Swab  Result Value Ref Range Status   MRSA by PCR Next Gen NOT DETECTED NOT DETECTED Final    Comment: (NOTE) The GeneXpert MRSA Assay (FDA approved for NASAL specimens only), is one component of a comprehensive MRSA colonization surveillance program. It is not intended to diagnose MRSA infection nor to guide or monitor treatment for MRSA infections. Test performance is not FDA approved in patients less than 27 years old. Performed at [facility], [place] [address], [place], [place] [address]      Labs: Basic Metabolic Panel: Recent Labs  Lab 06/05/21 1806 06/05/21 2351 06/06/21 0636 06/07/21 0315  NA 133*  --  131* 132*  K 4.3  --  4.6 4.9  CL 103  --  101 103  CO2 20*  --  18* 19*  GLUCOSE 267*  --  344* 259*  BUN 20  --  22 38*  CREATININE 1.04* 0.99 1.32* 1.41*  CALCIUM 8.9  --  9.0 9.0  MG 1.6*  --  2.0 2.6*   Liver Function Tests: Recent Labs  Lab 06/05/21 1806  AST 21  ALT 18  ALKPHOS 70  BILITOT 0.3  PROT 7.7  ALBUMIN 4.2   No results for input(s): LIPASE, AMYLASE in the last 168 hours. No results for input(s): AMMONIA in the last 168 hours. CBC: Recent Labs  Lab 06/05/21 1806 06/06/21 0202 06/07/21 0315  WBC 9.8 9.6   9.6 19.6*  NEUTROABS 8.1* 8.7*  --   HGB 10.1* 10.5*   10.4* 9.5*  HCT 32.0* 32.9*   33.4* 29.6*  MCV 97.9 99.1   100.3* 99.0  PLT 424* 389    377 362   Cardiac Enzymes: No results for input(s): CKTOTAL, CKMB, CKMBINDEX, TROPONINI in the last 168 hours. BNP: BNP (last 3 results) Recent Labs    04/23/21 0440 05/13/21 1000  BNP 54.0 15.9    ProBNP (last 3 results) No results for input(s): PROBNP in the last 8760 hours.  CBG: Recent Labs  Lab 06/08/21 1150 06/08/21 1656 06/08/21 2122 06/09/21 0750 06/09/21 1210  GLUCAP 320* 309* 269* 146* 226*       Signed:  Azhar Knope A MD.  [facility] 06/09/2021, 1:34 PM

## 2021-06-09 NOTE — TOC Progression Note (Signed)
Transition of Care [facility]) - Progression Note    Patient Details  Name: [name] MRN: [id number] Date of Birth: March 12, 1951  Transition of [facility]) CM/SW Contact  Purcell Mouton, RN Phone Number: 06/09/2021, 9:37 AM  Clinical Narrative:    TOC will continue to follow pt for discharge needs.         Expected Discharge Plan and Services                                                 Social Determinants of Health (SDOH) Interventions    Readmission Risk Interventions No flowsheet data found.

## 2021-06-09 NOTE — Progress Notes (Signed)
NAME:  [name], MRN:  [id number], DOB:  Jan 17, 1951, LOS: 0 ADMISSION DATE:  06/05/2021, CONSULTATION DATE:  06/05/20 REFERRING MD:  Eulis Foster , CHIEF COMPLAINT:  short of breath   History of Present Illness:  71 y/o woman with hx of HFpEF, severe persistent asthma, Vocal cord dysfunction, CKD 3a, CAD, DM2, HTN, GERD, Depression, anxiety, HLD.  Per 11/22 note: treatment plan to maintain on performist, singular, budesonide nebs Was hospitalized 1/13-1/14 with hypomag.  Plan for ENT referral. Brovana and pulmicort in house, not given steroids.   Seen on 05/17/21 in follow up by Dr. Melvyn Novas.  Reported she was hospitalized in Dec 22 with asthma flare (treated with standard asthma therapy per notes, mentioned need for ENT referral as OP, not seen by pulm per my review of notes).  Not quite returned to baseline per note.  Albuterol 4x per week.  Per note, taking Yupelri (Lama)/perforomist(LAMA)/singulair ,  Yupelri not on med list.    Pulmonary consulted due to increased wheezing and SOB, despite using ICS/LABA (formoterol and budesonide) and prn albuterol.  Instructed to start prednisone taper.   Per patient she has been coughing for 1 day.  Not improving despite home medications.  Per RT and ED, she was having expiratory wheezing sounds coming from upper airway prior to racemic epi.   She tells me she is often tachycardic after receiving albuterol Co chest discomfort, started in the ED.    Pertinent  Medical History  HFpEF, severe persistent asthma, Vocal cord dysfunction, CKD 3a, CAD, DM2, HTN, GERD, Depression, anxiety, HLD.  Normal PFTs 2012 Frequent non-specific "respiratory flares" - cough, wheeze, SOB.    Significant Hospital Events: Including procedures, antibiotic start and stop dates in addition to other pertinent events   2/5 presented to the ED with dyspnea and wheezing, admitted to ICU 2/6 100% on RA, still has upper airway wheezing 2/7 wheezing improved, remains on RA 2/8 Wheezing with  exertion  2/9 Wheezing improved at rest  Interim History / Subjective:  Pt reports post nasal gtt.  Feels as though she is improving but not yet ready to go home. Afebrile   Objective   Blood pressure (!) 175/75, pulse 85, temperature 98.6 F (37 C), temperature source Oral, resp. rate 15, height 5\' 2"  (1.575 m), weight 77.6 kg, SpO2 100 %.        Intake/Output Summary (Last 24 hours) at 06/09/2021 1004 Last data filed at 06/09/2021 0000 Gross per 24 hour  Intake 240 ml  Output 0 ml  Net 240 ml   Filed Weights   06/06/21 0151 06/07/21 0500 06/08/21 0500  Weight: 79 kg 78.2 kg 77.6 kg    Exam: General: adult female sitting up in chair in NAD HEENT: MM pink/moist, good dentition, anicteric  Neuro: AAOx4, speech clear, MAE  CV: s1s2 RRR, no m/r/g PULM: non-labored at rest, lungs bilaterally without wheeze / no upper airway wheeze on exam GI: soft, bsx4 active  Extremities: warm/dry, no edema  Skin: no rashes or lesions  Resolved Hospital Problem list     Assessment & Plan:   Acute respiratory distress, likely combination of chronic intermittent asthma, acid reflux and anxiety, possible vocal cord dysfunction 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  Saw ENT  at [facility] several years ago, though no Laryngoscopy done.  Previously felt wheezing related to GERD.  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 Melvyn Novas 2/28 as planned  Best Practice (right click and "Reselect all SmartList Selections" daily)  Diet/type: Regular consistency (see ordeLMWHrs) DVT prophylaxis: LMWH GI prophylaxis: H2B Lines: N/A Foley:  N/A Code Status:  full code Last date of multidisciplinary goals of care discussion: per primary    PCCM will sign off. Please call back if new needs arise.  Ok from pulmonary standpoint to transfer out of SDU.   Critical Care Time: n/a     Noe Gens, MSN, APRN, NP-C, AGACNP-BC [facility] 06/09/2021, 10:04 AM   Please see [email or web address] for pager details.   From 7A-7P if no response, please call [phone] After hours, please call ELink [phone]

## 2021-06-09 NOTE — Progress Notes (Signed)
Explained discharge instructions to patient. Patient verbalized understanding. Disconnected patient from monitor and removed Ivs. Gathered up all of patients belongings. Patient was taken down to main entrance in a wheelchair by [place] NT @ 1359 where patients daughter picked her up.

## 2021-06-19 ENCOUNTER — Emergency Department (HOSPITAL_COMMUNITY): Payer: Medicare Other

## 2021-06-19 ENCOUNTER — Inpatient Hospital Stay (HOSPITAL_COMMUNITY)
Admission: EM | Admit: 2021-06-19 | Discharge: 2021-06-24 | DRG: 155 | Disposition: A | Discharge status: Home Health | Payer: Medicare Other | Attending: Internal Medicine | Admitting: Internal Medicine

## 2021-06-19 ENCOUNTER — Encounter (HOSPITAL_COMMUNITY): Payer: Self-pay | Admitting: Emergency Medicine

## 2021-06-19 ENCOUNTER — Other Ambulatory Visit: Payer: Self-pay

## 2021-06-19 DIAGNOSIS — D72829 Elevated white blood cell count, unspecified: Secondary | ICD-10-CM

## 2021-06-19 DIAGNOSIS — J383 Other diseases of vocal cords: Secondary | ICD-10-CM | POA: Diagnosis not present

## 2021-06-19 DIAGNOSIS — Z7951 Long term (current) use of inhaled steroids: Secondary | ICD-10-CM

## 2021-06-19 DIAGNOSIS — Z833 Family history of diabetes mellitus: Secondary | ICD-10-CM

## 2021-06-19 DIAGNOSIS — Z79899 Other long term (current) drug therapy: Secondary | ICD-10-CM

## 2021-06-19 DIAGNOSIS — Z20822 Contact with and (suspected) exposure to covid-19: Secondary | ICD-10-CM | POA: Diagnosis present

## 2021-06-19 DIAGNOSIS — I10 Essential (primary) hypertension: Secondary | ICD-10-CM | POA: Diagnosis present

## 2021-06-19 DIAGNOSIS — K219 Gastro-esophageal reflux disease without esophagitis: Secondary | ICD-10-CM | POA: Diagnosis present

## 2021-06-19 DIAGNOSIS — Z7984 Long term (current) use of oral hypoglycemic drugs: Secondary | ICD-10-CM

## 2021-06-19 DIAGNOSIS — Z90710 Acquired absence of both cervix and uterus: Secondary | ICD-10-CM

## 2021-06-19 DIAGNOSIS — I130 Hypertensive heart and chronic kidney disease with heart failure and stage 1 through stage 4 chronic kidney disease, or unspecified chronic kidney disease: Secondary | ICD-10-CM | POA: Diagnosis present

## 2021-06-19 DIAGNOSIS — N1832 Chronic kidney disease, stage 3b: Secondary | ICD-10-CM | POA: Diagnosis present

## 2021-06-19 DIAGNOSIS — I2510 Atherosclerotic heart disease of native coronary artery without angina pectoris: Secondary | ICD-10-CM | POA: Diagnosis present

## 2021-06-19 DIAGNOSIS — Y92239 Unspecified place in hospital as the place of occurrence of the external cause: Secondary | ICD-10-CM | POA: Diagnosis not present

## 2021-06-19 DIAGNOSIS — Z885 Allergy status to narcotic agent status: Secondary | ICD-10-CM

## 2021-06-19 DIAGNOSIS — E785 Hyperlipidemia, unspecified: Secondary | ICD-10-CM | POA: Diagnosis present

## 2021-06-19 DIAGNOSIS — I252 Old myocardial infarction: Secondary | ICD-10-CM

## 2021-06-19 DIAGNOSIS — Z6831 Body mass index (BMI) 31.0-31.9, adult: Secondary | ICD-10-CM

## 2021-06-19 DIAGNOSIS — D638 Anemia in other chronic diseases classified elsewhere: Secondary | ICD-10-CM

## 2021-06-19 DIAGNOSIS — I5032 Chronic diastolic (congestive) heart failure: Secondary | ICD-10-CM | POA: Diagnosis present

## 2021-06-19 DIAGNOSIS — F0394 Unspecified dementia, unspecified severity, with anxiety: Secondary | ICD-10-CM | POA: Diagnosis present

## 2021-06-19 DIAGNOSIS — R0603 Acute respiratory distress: Secondary | ICD-10-CM | POA: Diagnosis not present

## 2021-06-19 DIAGNOSIS — E1165 Type 2 diabetes mellitus with hyperglycemia: Secondary | ICD-10-CM | POA: Diagnosis present

## 2021-06-19 DIAGNOSIS — E119 Type 2 diabetes mellitus without complications: Secondary | ICD-10-CM

## 2021-06-19 DIAGNOSIS — J4551 Severe persistent asthma with (acute) exacerbation: Secondary | ICD-10-CM | POA: Diagnosis present

## 2021-06-19 DIAGNOSIS — E669 Obesity, unspecified: Secondary | ICD-10-CM

## 2021-06-19 DIAGNOSIS — Z8249 Family history of ischemic heart disease and other diseases of the circulatory system: Secondary | ICD-10-CM

## 2021-06-19 DIAGNOSIS — F419 Anxiety disorder, unspecified: Secondary | ICD-10-CM

## 2021-06-19 DIAGNOSIS — D631 Anemia in chronic kidney disease: Secondary | ICD-10-CM | POA: Diagnosis present

## 2021-06-19 DIAGNOSIS — J45901 Unspecified asthma with (acute) exacerbation: Secondary | ICD-10-CM | POA: Diagnosis present

## 2021-06-19 DIAGNOSIS — T380X5A Adverse effect of glucocorticoids and synthetic analogues, initial encounter: Secondary | ICD-10-CM | POA: Diagnosis not present

## 2021-06-19 DIAGNOSIS — R490 Dysphonia: Secondary | ICD-10-CM | POA: Diagnosis present

## 2021-06-19 DIAGNOSIS — Z888 Allergy status to other drugs, medicaments and biological substances status: Secondary | ICD-10-CM

## 2021-06-19 DIAGNOSIS — Z955 Presence of coronary angioplasty implant and graft: Secondary | ICD-10-CM

## 2021-06-19 DIAGNOSIS — F32A Depression, unspecified: Secondary | ICD-10-CM | POA: Diagnosis present

## 2021-06-19 DIAGNOSIS — Z87891 Personal history of nicotine dependence: Secondary | ICD-10-CM

## 2021-06-19 DIAGNOSIS — E871 Hypo-osmolality and hyponatremia: Secondary | ICD-10-CM

## 2021-06-19 DIAGNOSIS — N179 Acute kidney failure, unspecified: Secondary | ICD-10-CM | POA: Diagnosis present

## 2021-06-19 DIAGNOSIS — Z825 Family history of asthma and other chronic lower respiratory diseases: Secondary | ICD-10-CM

## 2021-06-19 DIAGNOSIS — E1122 Type 2 diabetes mellitus with diabetic chronic kidney disease: Secondary | ICD-10-CM | POA: Diagnosis present

## 2021-06-19 LAB — RESPIRATORY PANEL BY PCR

## 2021-06-19 LAB — COMPREHENSIVE METABOLIC PANEL
ALT: 19 U/L (ref 0–44)
AST: 18 U/L (ref 15–41)
Albumin: 4.1 g/dL (ref 3.5–5.0)
Alkaline Phosphatase: 68 U/L (ref 38–126)
Anion gap: 9 (ref 5–15)
BUN: 19 mg/dL (ref 8–23)
CO2: 23 mmol/L (ref 22–32)
Calcium: 9.4 mg/dL (ref 8.9–10.3)
Chloride: 105 mmol/L (ref 98–111)
Creatinine, Ser: 1.24 mg/dL — ABNORMAL HIGH (ref 0.44–1.00)
GFR, Estimated: 46 mL/min — ABNORMAL LOW (ref >60)
Glucose, Bld: 149 mg/dL — ABNORMAL HIGH (ref 70–99)
Potassium: 4.2 mmol/L (ref 3.5–5.1)
Sodium: 137 mmol/L (ref 135–145)
Total Bilirubin: 0.4 mg/dL (ref 0.3–1.2)
Total Protein: 7.6 g/dL (ref 6.5–8.1)

## 2021-06-19 LAB — CBC WITH DIFFERENTIAL/PLATELET
Abs Immature Granulocytes: 0.17 10*3/uL — ABNORMAL HIGH (ref 0.00–0.07)
Basophils Absolute: 0.1 10*3/uL (ref 0.0–0.1)
Basophils Relative: 1 %
Eosinophils Absolute: 0.1 10*3/uL (ref 0.0–0.5)
Eosinophils Relative: 1 %
HCT: 36.3 % (ref 36.0–46.0)
Hemoglobin: 11.6 g/dL — ABNORMAL LOW (ref 12.0–15.0)
Immature Granulocytes: 1 %
Lymphocytes Relative: 26 %
Lymphs Abs: 3.4 10*3/uL (ref 0.7–4.0)
MCH: 31.3 pg (ref 26.0–34.0)
MCHC: 32 g/dL (ref 30.0–36.0)
MCV: 97.8 fL (ref 80.0–100.0)
Monocytes Absolute: 1 10*3/uL (ref 0.1–1.0)
Monocytes Relative: 8 %
Neutro Abs: 8.1 10*3/uL — ABNORMAL HIGH (ref 1.7–7.7)
Neutrophils Relative %: 63 %
Platelets: 336 10*3/uL (ref 150–400)
RBC: 3.71 MIL/uL — ABNORMAL LOW (ref 3.87–5.11)
RDW: 15.1 % (ref 11.5–15.5)
WBC: 12.8 10*3/uL — ABNORMAL HIGH (ref 4.0–10.5)
nRBC: 0 % (ref 0.0–0.2)

## 2021-06-19 LAB — RESP PANEL BY RT-PCR (FLU A&B, COVID) ARPGX2
Influenza A by PCR: NEGATIVE
Influenza B by PCR: NEGATIVE
SARS Coronavirus 2 by RT PCR: NEGATIVE

## 2021-06-19 LAB — GLUCOSE, CAPILLARY
Glucose-Capillary: 233 mg/dL — ABNORMAL HIGH (ref 70–99)
Glucose-Capillary: 195 mg/dL — ABNORMAL HIGH (ref 70–99)
Glucose-Capillary: 276 mg/dL — ABNORMAL HIGH (ref 70–99)

## 2021-06-19 LAB — CBG MONITORING, ED: Glucose-Capillary: 269 mg/dL — ABNORMAL HIGH (ref 70–99)

## 2021-06-19 MED ORDER — ALBUTEROL SULFATE (2.5 MG/3ML) 0.083% IN NEBU: 10.0000 mg/h | INHALATION_SOLUTION | RESPIRATORY_TRACT | Status: DC

## 2021-06-19 MED ORDER — ENOXAPARIN SODIUM 40 MG/0.4ML IJ SOSY
40.0000 mg | PREFILLED_SYRINGE | INTRAMUSCULAR | Status: DC
  Administered 2021-06-19 – 2021-06-20 (×2): 40 mg via SUBCUTANEOUS
  Filled 2021-06-19 (×2): qty 0.4

## 2021-06-19 MED ORDER — ADULT MULTIVITAMIN W/MINERALS CH
1.0000 | ORAL_TABLET | Freq: Every day | ORAL | Status: DC
  Administered 2021-06-20 – 2021-06-24 (×5): 1 via ORAL
  Filled 2021-06-19 (×6): qty 1

## 2021-06-19 MED ORDER — INSULIN ASPART 100 UNIT/ML IJ SOLN
0.0000 [IU] | Freq: Three times a day (TID) | INTRAMUSCULAR | Status: DC
  Administered 2021-06-19: 3 [IU] via SUBCUTANEOUS
  Administered 2021-06-19 – 2021-06-20 (×2): 8 [IU] via SUBCUTANEOUS
  Administered 2021-06-20 – 2021-06-21 (×3): 11 [IU] via SUBCUTANEOUS
  Administered 2021-06-21: 8 [IU] via SUBCUTANEOUS
  Administered 2021-06-21: 15 [IU] via SUBCUTANEOUS
  Administered 2021-06-22: 8 [IU] via SUBCUTANEOUS
  Administered 2021-06-22: 15 [IU] via SUBCUTANEOUS
  Administered 2021-06-22 – 2021-06-23 (×2): 11 [IU] via SUBCUTANEOUS
  Administered 2021-06-23: 8 [IU] via SUBCUTANEOUS
  Administered 2021-06-23: 11 [IU] via SUBCUTANEOUS
  Administered 2021-06-24: 8 [IU] via SUBCUTANEOUS
  Filled 2021-06-19: qty 0.15

## 2021-06-19 MED ORDER — PANTOPRAZOLE SODIUM 40 MG PO TBEC
40.0000 mg | DELAYED_RELEASE_TABLET | Freq: Two times a day (BID) | ORAL | Status: DC
  Administered 2021-06-19 – 2021-06-24 (×9): 40 mg via ORAL
  Filled 2021-06-19 (×9): qty 1

## 2021-06-19 MED ORDER — IPRATROPIUM-ALBUTEROL 0.5-2.5 (3) MG/3ML IN SOLN
3.0000 mL | Freq: Four times a day (QID) | RESPIRATORY_TRACT | Status: DC | PRN
  Administered 2021-06-19: 3 mL via RESPIRATORY_TRACT
  Filled 2021-06-19: qty 3

## 2021-06-19 MED ORDER — ALBUTEROL SULFATE (2.5 MG/3ML) 0.083% IN NEBU
10.0000 mg | INHALATION_SOLUTION | Freq: Once | RESPIRATORY_TRACT | Status: AC
  Administered 2021-06-19: 10 mg via RESPIRATORY_TRACT

## 2021-06-19 MED ORDER — ALPRAZOLAM 0.5 MG PO TABS
0.5000 mg | ORAL_TABLET | Freq: Two times a day (BID) | ORAL | Status: DC | PRN
  Administered 2021-06-19 – 2021-06-24 (×8): 0.5 mg via ORAL
  Filled 2021-06-19 (×8): qty 1

## 2021-06-19 MED ORDER — MAGNESIUM OXIDE -MG SUPPLEMENT 400 (240 MG) MG PO TABS
400.0000 mg | ORAL_TABLET | Freq: Every day | ORAL | Status: DC
  Administered 2021-06-19 – 2021-06-23 (×5): 400 mg via ORAL
  Filled 2021-06-19 (×5): qty 1

## 2021-06-19 MED ORDER — ALBUTEROL SULFATE (2.5 MG/3ML) 0.083% IN NEBU
INHALATION_SOLUTION | RESPIRATORY_TRACT | Status: AC
  Filled 2021-06-19: qty 12

## 2021-06-19 MED ORDER — MEMANTINE HCL 10 MG PO TABS
10.0000 mg | ORAL_TABLET | Freq: Two times a day (BID) | ORAL | Status: DC
  Administered 2021-06-19 – 2021-06-23 (×9): 10 mg via ORAL
  Filled 2021-06-19 (×9): qty 1

## 2021-06-19 MED ORDER — BIOTIN 5000 MCG PO CAPS: 5000.0000 ug | ORAL_CAPSULE | Freq: Every day | ORAL | Status: DC

## 2021-06-19 MED ORDER — MONTELUKAST SODIUM 10 MG PO TABS
10.0000 mg | ORAL_TABLET | Freq: Every day | ORAL | Status: DC
  Administered 2021-06-19 – 2021-06-23 (×5): 10 mg via ORAL
  Filled 2021-06-19 (×5): qty 1

## 2021-06-19 MED ORDER — IPRATROPIUM-ALBUTEROL 0.5-2.5 (3) MG/3ML IN SOLN
3.0000 mL | Freq: Once | RESPIRATORY_TRACT | Status: AC
  Administered 2021-06-19: 3 mL via RESPIRATORY_TRACT
  Filled 2021-06-19: qty 3

## 2021-06-19 MED ORDER — ATORVASTATIN CALCIUM 40 MG PO TABS
40.0000 mg | ORAL_TABLET | Freq: Every day | ORAL | Status: DC
  Administered 2021-06-19 – 2021-06-23 (×5): 40 mg via ORAL
  Filled 2021-06-19 (×5): qty 1

## 2021-06-19 MED ORDER — NEBIVOLOL HCL 10 MG PO TABS
10.0000 mg | ORAL_TABLET | Freq: Every day | ORAL | Status: DC
  Administered 2021-06-19 – 2021-06-23 (×5): 10 mg via ORAL
  Filled 2021-06-19 (×6): qty 1

## 2021-06-19 MED ORDER — LATANOPROST 0.005 % OP SOLN
1.0000 [drp] | Freq: Every day | OPHTHALMIC | Status: DC
  Administered 2021-06-19 – 2021-06-23 (×5): 1 [drp] via OPHTHALMIC
  Filled 2021-06-19: qty 2.5

## 2021-06-19 MED ORDER — GUAIFENESIN ER 600 MG PO TB12
600.0000 mg | ORAL_TABLET | Freq: Two times a day (BID) | ORAL | Status: DC
  Administered 2021-06-19 – 2021-06-23 (×9): 600 mg via ORAL
  Filled 2021-06-19 (×9): qty 1

## 2021-06-19 MED ORDER — MORPHINE SULFATE (PF) 2 MG/ML IV SOLN
2.0000 mg | Freq: Once | INTRAVENOUS | Status: AC
Start: 2021-06-19 — End: 2021-06-19
  Administered 2021-06-19: 2 mg via INTRAVENOUS
  Filled 2021-06-19: qty 1

## 2021-06-19 MED ORDER — RISAQUAD PO CAPS
1.0000 | ORAL_CAPSULE | Freq: Every morning | ORAL | Status: DC
  Administered 2021-06-19 – 2021-06-23 (×4): 1 via ORAL
  Filled 2021-06-19 (×4): qty 1

## 2021-06-19 MED ORDER — PANTOPRAZOLE SODIUM 40 MG IV SOLR
40.0000 mg | INTRAVENOUS | Status: DC
Start: 2021-06-19 — End: 2021-06-19
  Administered 2021-06-19: 40 mg via INTRAVENOUS
  Filled 2021-06-19: qty 10

## 2021-06-19 MED ORDER — LORAZEPAM 2 MG/ML IJ SOLN
0.5000 mg | Freq: Once | INTRAMUSCULAR | Status: AC
  Administered 2021-06-19: 0.5 mg via INTRAVENOUS
  Filled 2021-06-19: qty 1

## 2021-06-19 MED ORDER — METHYLPREDNISOLONE SODIUM SUCC 125 MG IJ SOLR
60.0000 mg | Freq: Two times a day (BID) | INTRAMUSCULAR | Status: DC
  Administered 2021-06-19 – 2021-06-24 (×9): 60 mg via INTRAVENOUS
  Filled 2021-06-19 (×10): qty 2

## 2021-06-19 MED ORDER — HYDRALAZINE HCL 50 MG PO TABS
100.0000 mg | ORAL_TABLET | Freq: Three times a day (TID) | ORAL | Status: DC
  Administered 2021-06-19 – 2021-06-23 (×15): 100 mg via ORAL
  Filled 2021-06-19 (×16): qty 2

## 2021-06-19 MED ORDER — MAGNESIUM SULFATE 2 GM/50ML IV SOLN
2.0000 g | Freq: Once | INTRAVENOUS | Status: AC
  Administered 2021-06-19: 2 g via INTRAVENOUS
  Filled 2021-06-19: qty 50

## 2021-06-19 MED ORDER — METOPROLOL TARTRATE 5 MG/5ML IV SOLN
5.0000 mg | Freq: Four times a day (QID) | INTRAVENOUS | Status: DC | PRN
  Administered 2021-06-20 – 2021-06-24 (×7): 5 mg via INTRAVENOUS
  Filled 2021-06-19 (×7): qty 5

## 2021-06-19 MED ORDER — FLUTICASONE PROPIONATE 50 MCG/ACT NA SUSP: 2.0000 | Freq: Every day | NASAL | Status: DC | PRN

## 2021-06-19 MED ORDER — DILTIAZEM HCL ER COATED BEADS 240 MG PO CP24
240.0000 mg | ORAL_CAPSULE | Freq: Every day | ORAL | Status: DC
  Administered 2021-06-19 – 2021-06-23 (×5): 240 mg via ORAL
  Filled 2021-06-19 (×3): qty 1
  Filled 2021-06-19: qty 2
  Filled 2021-06-19: qty 1

## 2021-06-19 MED ORDER — DIVALPROEX SODIUM ER 500 MG PO TB24
500.0000 mg | ORAL_TABLET | Freq: Every day | ORAL | Status: DC
  Administered 2021-06-19 – 2021-06-23 (×5): 500 mg via ORAL
  Filled 2021-06-19 (×5): qty 1

## 2021-06-19 MED ORDER — INSULIN ASPART 100 UNIT/ML IJ SOLN
0.0000 [IU] | Freq: Every day | INTRAMUSCULAR | Status: DC
  Administered 2021-06-19: 3 [IU] via SUBCUTANEOUS
  Administered 2021-06-20: 4 [IU] via SUBCUTANEOUS
  Administered 2021-06-21: 2 [IU] via SUBCUTANEOUS
  Administered 2021-06-22: 5 [IU] via SUBCUTANEOUS
  Administered 2021-06-23: 2 [IU] via SUBCUTANEOUS
  Filled 2021-06-19: qty 0.05

## 2021-06-19 MED ORDER — HYDROCOD POLI-CHLORPHE POLI ER 10-8 MG/5ML PO SUER
5.0000 mL | Freq: Four times a day (QID) | ORAL | Status: DC
  Administered 2021-06-19 – 2021-06-20 (×4): 5 mL via ORAL
  Filled 2021-06-19 (×4): qty 5

## 2021-06-19 MED ORDER — OXYCODONE-ACETAMINOPHEN 5-325 MG PO TABS
1.0000 | ORAL_TABLET | Freq: Four times a day (QID) | ORAL | Status: DC | PRN
  Administered 2021-06-19 – 2021-06-24 (×11): 1 via ORAL
  Filled 2021-06-19 (×11): qty 1

## 2021-06-19 MED ORDER — IPRATROPIUM-ALBUTEROL 0.5-2.5 (3) MG/3ML IN SOLN
3.0000 mL | Freq: Four times a day (QID) | RESPIRATORY_TRACT | Status: DC
  Administered 2021-06-19 – 2021-06-21 (×8): 3 mL via RESPIRATORY_TRACT
  Filled 2021-06-19 (×9): qty 3

## 2021-06-19 MED ORDER — METHYLPREDNISOLONE SODIUM SUCC 125 MG IJ SOLR
125.0000 mg | Freq: Once | INTRAMUSCULAR | Status: AC
  Administered 2021-06-19: 125 mg via INTRAVENOUS
  Filled 2021-06-19: qty 2

## 2021-06-19 NOTE — H&P (Signed)
History and Physical    Patient: [name] HFW:[id number] DOB: 11-20-1950 DOA: 06/19/2021 DOS: the patient was seen and examined on 06/19/2021 PCP: [name], MD  Patient coming from: Home  Chief Complaint:  Chief Complaint  Patient presents with   Respiratory Distress    HPI: BEAUTY PLESS is a 71 y.o. female with medical history significant of DM2, HTN, anxiety, severe persistent asthma. Presenting with respiratory distress. She reports that her grandchild had a respiratory illness this week and she feels that she may have caught it. She reports that her symptoms came on 3 days ago. She started taking nebs and steroids at home, but she was still wheezy and short of breath. There were no fevers, N/V, or body aches. When her symptoms didn't improve last night, she decided to come to the ED for help. She denies any other aggravating or alleviating factors.    Review of Systems: As mentioned in the history of present illness. All other systems reviewed and are negative. Past Medical History:  Diagnosis Date   Anxiety    Arthritis    Asthma    Chronic diastolic CHF (congestive heart failure) ([place]) 07/17/2016   Coronary artery disease    a. NSTEMI 12/2015 - LHC 01/18/16: s/p overlapping DESx2 to RCA, 10% ost-prox Cx,10% mLAD.   Dementia ([place])    Depression    Diabetes mellitus    Dyslipidemia    GERD (gastroesophageal reflux disease)    History of seizure    Hypertension    Hypertensive heart disease    NSTEMI (non-ST elevated myocardial infarction) ([place]) 12/2015   Stage 3 chronic kidney disease (HCC)    Past Surgical History:  Procedure Laterality Date   ABDOMINAL HYSTERECTOMY     CARDIAC CATHETERIZATION  1994   minimal LAD dz, no other dz, EF normal   CARDIAC CATHETERIZATION N/A 01/18/2016   Procedure: Left Heart Cath and Coronary Angiography;  Surgeon: Burnell Blanks, MD;  Location: [facility];  Service: Cardiovascular;  Laterality: N/A;   CARDIAC  CATHETERIZATION N/A 01/18/2016   Procedure: Coronary Stent Intervention;  Surgeon: Burnell Blanks, MD;  Location: [facility];  Service: Cardiovascular;  Laterality: N/A;   COLONOSCOPY WITH PROPOFOL N/A 04/09/2014   Procedure: COLONOSCOPY WITH PROPOFOL;  Surgeon: Cleotis Nipper, MD;  Location: [facility];  Service: Endoscopy;  Laterality: N/A;   CORONARY STENT PLACEMENT  01/19/2016   STENT SYNERGY DES 0.27X41 drug eluting stent was successfully placed, and overlaps the 2.5 x 38 mm Synergy stent placed distally.   ESOPHAGOGASTRODUODENOSCOPY (EGD) WITH PROPOFOL N/A 04/09/2014   Procedure: ESOPHAGOGASTRODUODENOSCOPY (EGD) WITH PROPOFOL;  Surgeon: Cleotis Nipper, MD;  Location: [facility];  Service: Endoscopy;  Laterality: N/A;   FOOT SURGERY Bilateral    approx ~2000   HEMORRHOID SURGERY     HERNIA REPAIR     KNEE ARTHROSCOPY     RADIOACTIVE SEED GUIDED EXCISIONAL BREAST BIOPSY Left 01/20/2021   Procedure: RADIOACTIVE SEED GUIDED EXCISIONAL LEFT BREAST BIOPSY;  Surgeon: Stark Klein, MD;  Location: [place];  Service: General;  Laterality: Left;   Social History:  reports that she quit smoking about 17 years ago. Her smoking use included cigarettes. She has a 30.00 pack-year smoking history. She has never used smokeless tobacco. She reports that she does not drink alcohol and does not use drugs.  Allergies  Allergen Reactions   Chlorthalidone Other (See Comments)    Was stopped due to kidney function   Tramadol  Other (See Comments)    Not to be taken because of existing seizure disorder/SEIZURES    Family History  Problem Relation Age of Onset   Allergies Mother    Asthma Mother    CAD Father 9   Diabetes Father    Hypertension Father    Congestive Heart Failure Sister        died in her 33s   CAD Sister 14    Prior to Admission medications   Medication Sig Start Date End Date Taking? Authorizing Provider  albuterol (PROAIR HFA) 108 (90 Base) MCG/ACT inhaler  Inhale 2 puffs into the lungs every 4 (four) hours as needed for wheezing or shortness of breath. 04/20/21  Yes Parrett, [name] S, NP  albuterol (PROVENTIL) (2.5 MG/3ML) 0.083% nebulizer solution Take 3 mLs (2.5 mg total) by nebulization every 4 (four) hours as needed for wheezing or shortness of breath. 05/17/21  Yes Tanda Rockers, MD  ALPRAZolam Duanne Moron) 0.5 MG tablet Take 1 tablet (0.5 mg total) by mouth 2 (two) times daily as needed for anxiety. Patient taking differently: Take 0.5 mg by mouth See admin instructions. Take 0.5 mg by mouth at bedtime and an additional 0.5 mg once a day as needed for anxiety 07/31/15  Yes Theodis Blaze, MD  atorvastatin (LIPITOR) 40 MG tablet Take 40 mg by mouth at bedtime.   Yes [provider]  Biotin 5000 MCG CAPS Take 5,000 mcg by mouth daily.   Yes [provider]  budesonide (PULMICORT) 0.5 MG/2ML nebulizer solution USE 1 VIAL IN NEBULIZER IN THE MORNING AND AT BEDTIME Patient taking differently: Take 0.5 mg by nebulization in the morning and at bedtime. 03/29/21  Yes Tanda Rockers, MD  diltiazem (CARDIZEM CD) 240 MG 24 hr capsule Take 1 capsule (240 mg total) by mouth daily. 03/28/21  Yes Jerline Pain, MD  divalproex (DEPAKOTE ER) 500 MG 24 hr tablet Take 1 tablet (500 mg total) by mouth at bedtime. 03/15/21  Yes Marcial Pacas, MD  famotidine (PEPCID) 20 MG tablet Take 20 mg by mouth 2 (two) times daily before a meal.   Yes [provider]  fluticasone (FLONASE) 50 MCG/ACT nasal spray Place 2 sprays into both nostrils daily as needed for allergies.   Yes [provider]  formoterol (PERFOROMIST) 20 MCG/2ML nebulizer solution USE 1 VIAL  IN  NEBULIZER TWICE  DAILY - morning and evening Patient taking differently: 20 mcg 2 (two) times daily. 03/29/21  Yes Tanda Rockers, MD  glipiZIDE (GLUCOTROL) 5 MG tablet Take 5 mg by mouth daily before breakfast.   Yes [provider]  hydrALAZINE (APRESOLINE) 100 MG tablet Take 1  tablet (100 mg total) by mouth 3 (three) times daily. Please make yearly appt with Dr. Marlou Porch for April 2023 for future refills. Thank you 1st attempt Patient taking differently: Take 100 mg by mouth 3 (three) times daily. 06/01/21  Yes Jerline Pain, MD  latanoprost (XALATAN) 0.005 % ophthalmic solution Place 1 drop into both eyes at bedtime. 02/05/20  Yes [provider]  magnesium oxide (MAG-OX) 400 (240 Mg) MG tablet Take 1 tablet (400 mg total) by mouth daily. 05/14/21  Yes Lavina Hamman, MD  memantine (NAMENDA) 10 MG tablet Take 1 tablet (10 mg total) by mouth 2 (two) times daily. Patient taking differently: Take 10 mg by mouth in the morning and at bedtime. 03/15/21  Yes Marcial Pacas, MD  metFORMIN (GLUCOPHAGE) 1000 MG tablet Take 1,000 mg by mouth  2 (two) times daily with a meal.   Yes [provider]  montelukast (SINGULAIR) 10 MG tablet Take 1 tablet (10 mg total) by mouth at bedtime. 05/25/21  Yes Tanda Rockers, MD  Multiple Vitamin (MULTIVITAMIN WITH MINERALS) TABS tablet Take 1 tablet by mouth daily with breakfast.   Yes [provider]  nebivolol (BYSTOLIC) 10 MG tablet Take 1 tablet (10 mg total) by mouth daily. 04/10/19  Yes Jerline Pain, MD  potassium chloride SA (KLOR-CON M) 20 MEQ tablet Take 1 tablet (20 mEq total) by mouth every other day. 05/14/21  Yes Lavina Hamman, MD  Probiotic Product (EQ PROBIOTIC) CAPS Take 1 capsule by mouth every morning.   Yes [provider]  cyclobenzaprine (FLEXERIL) 10 MG tablet Take 1 tablet (10 mg total) by mouth 2 (two) times daily as needed for muscle spasms. Patient not taking: Reported on 06/19/2021 05/15/21   Isla Pence, MD  nitroGLYCERIN (NITROSTAT) 0.4 MG SL tablet DISSOLVE ONE TABLET UNDER THE TONGUE EVERY 5 MINUTES AS NEEDED FOR CHEST PAIN.  DO NOT EXCEED A TOTAL OF 3 DOSES IN 15 MINUTES Patient taking differently: 0.4 mg every 5 (five) minutes as needed for chest pain. 11/19/19   Burtis Junes, NP   oxyCODONE-acetaminophen (PERCOCET/ROXICET) 5-325 MG tablet Take 1 tablet by mouth every 6 (six) hours as needed (for pain). 06/04/21   [provider]    Physical Exam: Vitals:   06/19/21 0530 06/19/21 0600 06/19/21 0721 06/19/21 0723  BP: (!) 156/78 (!) 153/89 (!) 168/98   Pulse: (!) 119 (!) 112 (!) 110   Resp: (!) 22 (!) 23 20   Temp:    98.4 F (36.9 C)  TempSrc:    Oral  SpO2: 98% 98% 100%    General: 71 y.o. female resting in bed in NAD Eyes: PERRL, normal sclera ENMT: Nares patent w/o discharge, orophaynx clear, dentition normal, ears w/o discharge/lesions/ulcers Neck: Supple, trachea midline Cardiovascular: tachy, +S1, S2, no m/g/r, equal pulses throughout Respiratory: stridor noted, lungs are clear, increased WOB w/ accessory muscle usage GI: BS+, NDNT, no masses noted, no organomegaly noted MSK: No e/c/c Neuro: A&O x 3, no focal deficits Psyc: Appropriate interaction and affect, calm/cooperative  Data Reviewed:  Scr 1.24 Glucose 149 WBC 12.8 Hgb 11.6  CT chest: 1. No acute finding in the Chest. Normal patency of the major airways today, see HR Chest CT 06/08/2021 regarding findings of excessive dynamic airway collapse.   Assessment and Plan: No notes have been filed under this hospital service. Service: Hospitalist Asthma exacerbation     - place in obs, tele     - CT as above     - sats are 100% on RA     - continue steroids, nebs; will add xanax, PPI     - PCCM consulted (Dr. Melvyn Novas); appreciate assistance     - RVP ordered     - see was seen by ENT during hospitalization 10 days ago; per their note 2/8: " I do not see an anatomical issue on examination of her upper airway and larynx that would explain the biphasic stridor she is exhibiting.  It is interesting that it only occurs while awake and when I am in the room.  While asleep she seems to have laminar flow without any stridor whatsoever.  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  The latter typically exhibits some additional findings which were not present. Given  that I cannot fully evaluate the subglottis and tracheobronchial tree, I will defer to the pulmonary team to rule out the cause for her symptoms in that region.  This may include a fine cut CT through her trachea and/or a bronchoscopy if necessary.   In the event that portion of her work-up is also normal, I would recommend an evaluation for spasmodic dysphonia by speech therapy.  As previously recommended, she should follow-up with ENT and speech therapy once discharged. "  DM2     - will allow her to eat, but make it as easy as possible with a CLD for now; can advance as tolerated     - SSI, glucose checks  Anxiety     - continue home regimen, PRN xanax  HTN     - continue home regimen, PRN metoprolol  GERD     - PPI  Advance Care Planning:   Code Status: FULL  Consults: PCCM (Dr. Melvyn Novas)  Family Communication: None at bedside  Severity of Illness: The appropriate patient status for this patient is OBSERVATION. Observation status is judged to be reasonable and necessary in order to provide the required intensity of service to ensure the patient's safety. The patient's presenting symptoms, physical exam findings, and initial radiographic and laboratory data in the context of their medical condition is felt to place them at decreased risk for further clinical deterioration. Furthermore, it is anticipated that the patient will be medically stable for discharge from the hospital within 2 midnights of admission.   Author: Jonnie Finner, DO 06/19/2021 8:22 AM  For on call review [email or web address].

## 2021-06-19 NOTE — ED Triage Notes (Signed)
Patient presents with auditory wheezing, unable to speak in full sentences. Patient hx asthma and COPD, recent admission to the ICU for same.

## 2021-06-19 NOTE — ED Provider Notes (Addendum)
° °  Patient seen after prior ED provider.  Patient is reporting that she feels significantly improved since arrival to the ED.  Patient with minimal to no expiratory wheeze on exam.  Patient with mild upper respiratory noises/stridor on exam.  Patient is able to speak in full sentences.  She is without desaturation.  Room air pulse ox is 100%.  Patient will be admitted.  Hospitalist service is aware of case.  Dr. Melvyn Novas, Pulmonary, is aware of case and will consult.       Valarie Merino, MD 06/19/21 4132    Valarie Merino, MD 06/19/21 [id number]

## 2021-06-19 NOTE — Consult Note (Signed)
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 [place] related sev days prior to admit and wheeze refractory to nebs > admit am 2/19 and PCCM consult requested      Significant Hospital Events: Including procedures, antibiotic start and stop dates in addition to other pertinent events   Admit am 2/19 with refractory upper airway wheez    Scheduled Meds:  acidophilus  1 capsule Oral q morning   albuterol       atorvastatin  40 mg Oral QHS   diltiazem  240 mg Oral Daily   guaiFENesin  600 mg Oral BID   hydrALAZINE  100 mg Oral TID   insulin aspart  0-15 Units Subcutaneous TID WC   insulin aspart  0-5 Units Subcutaneous QHS   magnesium oxide  400 mg Oral Daily   methylPREDNISolone (SOLU-MEDROL) injection  60 mg Intravenous Q12H   montelukast  10 mg Oral QHS   [START ON 06/20/2021] multivitamin with minerals  1 tablet Oral Q breakfast   nebivolol  10 mg Oral Daily   pantoprazole (PROTONIX) IV  40 mg Intravenous Q24H   Continuous Infusions:  albuterol     PRN Meds:.ALPRAZolam  Interim History / Subjective:  No response to ER rx including IV steroids and MgS04   Objective   Blood pressure (!) 175/99, pulse (!) 112, temperature 98.4 F (36.9 C), temperature source Oral, resp. rate (!) 23, SpO2 99 %.       No intake or output data in the 24 hours ending 06/19/21 1239 There were no vitals filed for this visit.  Examination: General: hoarse bf with wheezing audible across the room  HENT: prominent pseudowheeze  Lungs: clear bilaterally with sats 99% on RA Cardiovascular: RRR no s3 Abdomen: soft / benign Extremities: warm s calf tenderness /  edema Neuro: no def/ alert and approp    I personally reviewed images and agree with radiology impression as follows:  CT 2/19 am s contrast No acute finding in the Chest. Normal patency of the major airways today, see HR Chest CT       Assessment & Plan:  1)  Classic VCD with hoarsness/ harsh upper airway cough triggered by URI Of the three most common causes of  Sub-acute / recurrent or chronic cough, only one (GERD)  can actually contribute to/ trigger  the other two (asthma and post nasal drip syndrome)  and perpetuate the cylce of cough. While not intuitively obvious, many patients with chronic low grade reflux do not cough until there is a primary insult that disturbs the protective epithelial barrier and exposes sensitive nerve endings.   This is typically viral but can due to PNDS and  either may apply here.   >>>   The point is that once this occurs, it is difficult to eliminate the cycle  using anything but a maximally effective acid suppression regimen at least in the short run, accompanied by an appropriate diet to address non acid GERD and control / eliminate the cough itself for at least 3 days with  cough suppression with hydocodone and elimination of pnds with 1st gen H1 blockers per guidelines  (chlortrimeton  4 mg q 4 )   >>> longterm will consder referral to ENT / [facility] Theda Sers who has had luck with laryngeal injections in this setting (will work this out as outpt)   2) she does has mild asthma and would continue rx as you plan, avoiding hfa and dpi inhalers which tend to aggravate problem #1      Labs   CBC: Recent Labs  Lab 06/19/21 0440  WBC 12.8*  NEUTROABS 8.1*  HGB 11.6*  HCT 36.3  MCV 97.8  PLT 379    Basic Metabolic Panel: Recent Labs  Lab 06/19/21 0440  NA 137  K 4.2  CL 105  CO2 23  GLUCOSE 149*  BUN 19  CREATININE 1.24*  CALCIUM 9.4   GFR: Estimated Creatinine Clearance: 39.6 mL/min (A) (by C-G formula based on SCr of 1.24 mg/dL  (H)). Recent Labs  Lab 06/19/21 0440  WBC 12.8*    Liver Function Tests: Recent Labs  Lab 06/19/21 0440  AST 18  ALT 19  ALKPHOS 68  BILITOT 0.4  PROT 7.6  ALBUMIN 4.1   No results for input(s): LIPASE, AMYLASE in the last 168 hours. No results for input(s): AMMONIA in the last 168 hours.  ABG    Component Value Date/Time   PHART 7.382 08/12/2017 0550   PCO2ART 45.0 08/12/2017 0550   PO2ART 163.0 (H) 08/12/2017 0550   HCO3 17.6 (L) 06/05/2021 2351   TCO2 30 03/15/2018 1100   ACIDBASEDEF 7.5 (H) 06/05/2021 2351   O2SAT 93.7 06/05/2021 2351     Coagulation Profile: No results for input(s): INR, PROTIME in the last 168 hours.  Cardiac Enzymes: No results for input(s): CKTOTAL, CKMB, CKMBINDEX, TROPONINI in the last 168 hours.  HbA1C: Hgb A1c MFr Bld  Date/Time Value Ref Range Status  06/05/2021 06:06 PM 8.2 (H) 4.8 - 5.6 % Final    Comment:    (NOTE) Pre diabetes:          5.7%-6.4%  Diabetes:              >6.4%  Glycemic control for   <7.0% adults with diabetes   04/05/2021 12:22 AM 6.8 (H) 4.8 - 5.6 % Final    Comment:    (NOTE) Pre diabetes:          5.7%-6.4%  Diabetes:              >6.4%  Glycemic control for   <7.0% adults with diabetes     CBG: Recent Labs  Lab 06/19/21 1211  GLUCAP 269*      She,  has a past medical history of Anxiety, Arthritis, Asthma, Chronic diastolic CHF (congestive heart failure) ([place]) (07/17/2016), Coronary artery disease, Dementia ([place]), Depression, Diabetes mellitus, Dyslipidemia, GERD (gastroesophageal reflux disease), History of seizure, Hypertension, Hypertensive heart disease, NSTEMI (non-ST elevated myocardial infarction) ([place]) (12/2015), and Stage 3 chronic kidney disease ([place]).   Surgical History:   Past Surgical History:  Procedure Laterality Date   ABDOMINAL HYSTERECTOMY     CARDIAC CATHETERIZATION  1994   minimal LAD dz, no other dz, EF normal   CARDIAC CATHETERIZATION N/A 01/18/2016    Procedure: Left Heart Cath and Coronary Angiography;  Surgeon: Burnell Blanks, MD;  Location: [facility] [facility];  Service: Cardiovascular;  Laterality: N/A;   CARDIAC CATHETERIZATION N/A 01/18/2016   Procedure: Coronary Stent Intervention;  Surgeon: Burnell Blanks, MD;  Location: [place] [facility];  Service: Cardiovascular;  Laterality: N/A;   COLONOSCOPY WITH PROPOFOL  N/A 04/09/2014   Procedure: COLONOSCOPY WITH PROPOFOL;  Surgeon: Cleotis Nipper, MD;  Location: [facility];  Service: Endoscopy;  Laterality: N/A;   CORONARY STENT PLACEMENT  01/19/2016   STENT SYNERGY DES 2.37S28 drug eluting stent was successfully placed, and overlaps the 2.5 x 38 mm Synergy stent placed distally.   ESOPHAGOGASTRODUODENOSCOPY (EGD) WITH PROPOFOL N/A 04/09/2014   Procedure: ESOPHAGOGASTRODUODENOSCOPY (EGD) WITH PROPOFOL;  Surgeon: Cleotis Nipper, MD;  Location: [facility];  Service: Endoscopy;  Laterality: N/A;   FOOT SURGERY Bilateral    approx ~2000   HEMORRHOID SURGERY     HERNIA REPAIR     KNEE ARTHROSCOPY     RADIOACTIVE SEED GUIDED EXCISIONAL BREAST BIOPSY Left 01/20/2021   Procedure: RADIOACTIVE SEED GUIDED EXCISIONAL LEFT BREAST BIOPSY;  Surgeon: Stark Klein, MD;  Location: [place];  Service: General;  Laterality: Left;     Social History:   reports that she quit smoking about 17 years ago. Her smoking use included cigarettes. She has a 30.00 pack-year smoking history. She has never used smokeless tobacco. She reports that she does not drink alcohol and does not use drugs.   Family History:  Her family history includes Allergies in her mother; Asthma in her mother; CAD (age of onset: 49) in her father; CAD (age of onset: 28) in her sister; Congestive Heart Failure in her sister; Diabetes in her father; Hypertension in her father.   Allergies Allergies  Allergen Reactions   Chlorthalidone Other (See Comments)    Was stopped due to kidney function   Tramadol Other (See  Comments)    Not to be taken because of existing seizure disorder/SEIZURES     Home Medications  Prior to Admission medications   Medication Sig Start Date End Date Taking? Authorizing Provider  albuterol (PROAIR HFA) 108 (90 Base) MCG/ACT inhaler Inhale 2 puffs into the lungs every 4 (four) hours as needed for wheezing or shortness of breath. 04/20/21  Yes Parrett, [name] S, NP  albuterol (PROVENTIL) (2.5 MG/3ML) 0.083% nebulizer solution Take 3 mLs (2.5 mg total) by nebulization every 4 (four) hours as needed for wheezing or shortness of breath. 05/17/21  Yes Tanda Rockers, MD  ALPRAZolam Duanne Moron) 0.5 MG tablet Take 1 tablet (0.5 mg total) by mouth 2 (two) times daily as needed for anxiety. Patient taking differently: Take 0.5 mg by mouth See admin instructions. Take 0.5 mg by mouth at bedtime and an additional 0.5 mg once a day as needed for anxiety 07/31/15  Yes Theodis Blaze, MD  atorvastatin (LIPITOR) 40 MG tablet Take 40 mg by mouth at bedtime.   Yes [provider]  Biotin 5000 MCG CAPS Take 5,000 mcg by mouth daily.   Yes [provider]  budesonide (PULMICORT) 0.5 MG/2ML nebulizer solution USE 1 VIAL IN NEBULIZER IN THE MORNING AND AT BEDTIME Patient taking differently: Take 0.5 mg by nebulization in the morning and at bedtime. 03/29/21  Yes Tanda Rockers, MD  diltiazem (CARDIZEM CD) 240 MG 24 hr capsule Take 1 capsule (240 mg total) by mouth daily. 03/28/21  Yes Jerline Pain, MD  divalproex (DEPAKOTE ER) 500 MG 24 hr tablet Take 1 tablet (500 mg total) by mouth at bedtime. 03/15/21  Yes Marcial Pacas, MD  famotidine (PEPCID) 20 MG tablet Take 20 mg by mouth 2 (two) times daily before a meal.   Yes [provider]  fluticasone (FLONASE) 50 MCG/ACT nasal spray Place 2 sprays into both nostrils daily as needed for  allergies.   Yes [provider]  formoterol (PERFOROMIST) 20 MCG/2ML nebulizer solution USE 1 VIAL  IN  NEBULIZER TWICE  DAILY - morning and  evening Patient taking differently: 20 mcg 2 (two) times daily. 03/29/21  Yes Tanda Rockers, MD  glipiZIDE (GLUCOTROL) 5 MG tablet Take 5 mg by mouth daily before breakfast.   Yes [provider]  hydrALAZINE (APRESOLINE) 100 MG tablet Take 1 tablet (100 mg total) by mouth 3 (three) times daily. Please make yearly appt with Dr. Marlou Porch for April 2023 for future refills. Thank you 1st attempt Patient taking differently: Take 100 mg by mouth 3 (three) times daily. 06/01/21  Yes Jerline Pain, MD  latanoprost (XALATAN) 0.005 % ophthalmic solution Place 1 drop into both eyes at bedtime. 02/05/20  Yes [provider]  magnesium oxide (MAG-OX) 400 (240 Mg) MG tablet Take 1 tablet (400 mg total) by mouth daily. 05/14/21  Yes Lavina Hamman, MD  memantine (NAMENDA) 10 MG tablet Take 1 tablet (10 mg total) by mouth 2 (two) times daily. Patient taking differently: Take 10 mg by mouth in the morning and at bedtime. 03/15/21  Yes Marcial Pacas, MD  metFORMIN (GLUCOPHAGE) 1000 MG tablet Take 1,000 mg by mouth 2 (two) times daily with a meal.   Yes [provider]  montelukast (SINGULAIR) 10 MG tablet Take 1 tablet (10 mg total) by mouth at bedtime. 05/25/21  Yes Tanda Rockers, MD  Multiple Vitamin (MULTIVITAMIN WITH MINERALS) TABS tablet Take 1 tablet by mouth daily with breakfast.   Yes [provider]  nebivolol (BYSTOLIC) 10 MG tablet Take 1 tablet (10 mg total) by mouth daily. 04/10/19  Yes Jerline Pain, MD  potassium chloride SA (KLOR-CON M) 20 MEQ tablet Take 1 tablet (20 mEq total) by mouth every other day. 05/14/21  Yes Lavina Hamman, MD  Probiotic Product (EQ PROBIOTIC) CAPS Take 1 capsule by mouth every morning.   Yes [provider]  cyclobenzaprine (FLEXERIL) 10 MG tablet Take 1 tablet (10 mg total) by mouth 2 (two) times daily as needed for muscle spasms. Patient not taking: Reported on 06/19/2021 05/15/21   Isla Pence, MD  nitroGLYCERIN (NITROSTAT)  0.4 MG SL tablet DISSOLVE ONE TABLET UNDER THE TONGUE EVERY 5 MINUTES AS NEEDED FOR CHEST PAIN.  DO NOT EXCEED A TOTAL OF 3 DOSES IN 15 MINUTES Patient taking differently: 0.4 mg every 5 (five) minutes as needed for chest pain. 11/19/19   Burtis Junes, NP  oxyCODONE-acetaminophen (PERCOCET/ROXICET) 5-325 MG tablet Take 1 tablet by mouth every 6 (six) hours as needed (for pain). 06/04/21   [provider]      Christinia Gully, MD Pulmonary and [place] [phone]   After 7:00 pm call Elink  [phone]

## 2021-06-19 NOTE — Progress Notes (Signed)
PT demonstrated hands on understanding of Flutter device- NPC at this time. 

## 2021-06-19 NOTE — ED Provider Notes (Signed)
[place] DEPT Provider Note   CSN: [id number] Arrival date & time: 06/19/21  0430     History  Chief Complaint  Patient presents with   Respiratory Distress    [name] is a 71 y.o. female.  Patient presents with wheezing and dyspnea.  Patient states has not gotten any better since she left the hospital last time.  I reviewed the records it seems that they were attributing her symptoms to likely tracheobronchomalacia.  She had seen ENT here and the scope down to the epiglottis without any significant abnormalities.  She had been started on steroids and was was to follow-up with pulmonology for further management.  Patient once again does not state that it any better or worse and it was just that is still the same. No fevers.        Home Medications Prior to Admission medications   Medication Sig Start Date End Date Taking? Authorizing Provider  albuterol (PROAIR HFA) 108 (90 Base) MCG/ACT inhaler Inhale 2 puffs into the lungs every 4 (four) hours as needed for wheezing or shortness of breath. 04/20/21   Parrett, Fonnie Mu, NP  albuterol (PROVENTIL) (2.5 MG/3ML) 0.083% nebulizer solution Take 3 mLs (2.5 mg total) by nebulization every 4 (four) hours as needed for wheezing or shortness of breath. 05/17/21   Tanda Rockers, MD  ALPRAZolam Duanne Moron) 0.5 MG tablet Take 1 tablet (0.5 mg total) by mouth 2 (two) times daily as needed for anxiety. Patient taking differently: Take 0.5 mg by mouth See admin instructions. Take 0.5 mg by mouth at bedtime and an additional 0.5 mg once a day as needed for anxiety 07/31/15   Theodis Blaze, MD  atorvastatin (LIPITOR) 40 MG tablet Take 40 mg by mouth at bedtime.    [provider]  Biotin 5000 MCG CAPS Take 5,000 mcg by mouth daily.    [provider]  budesonide (PULMICORT) 0.5 MG/2ML nebulizer solution USE 1 VIAL IN NEBULIZER IN THE MORNING AND AT BEDTIME Patient taking differently: Take 0.5 mg by  nebulization in the morning and at bedtime. 03/29/21   Tanda Rockers, MD  cyclobenzaprine (FLEXERIL) 10 MG tablet Take 1 tablet (10 mg total) by mouth 2 (two) times daily as needed for muscle spasms. 05/15/21   Isla Pence, MD  diltiazem (CARDIZEM CD) 240 MG 24 hr capsule Take 1 capsule (240 mg total) by mouth daily. 03/28/21   Jerline Pain, MD  divalproex (DEPAKOTE ER) 500 MG 24 hr tablet Take 1 tablet (500 mg total) by mouth at bedtime. 03/15/21   Marcial Pacas, MD  famotidine (PEPCID) 20 MG tablet Take 20 mg by mouth 2 (two) times daily before a meal.    [provider]  fluticasone (FLONASE) 50 MCG/ACT nasal spray Place 2 sprays into both nostrils daily as needed for allergies.    [provider]  formoterol (PERFOROMIST) 20 MCG/2ML nebulizer solution USE 1 VIAL  IN  NEBULIZER TWICE  DAILY - morning and evening Patient taking differently: 20 mcg 2 (two) times daily. 03/29/21   Tanda Rockers, MD  glipiZIDE (GLUCOTROL) 5 MG tablet Take 5 mg by mouth daily before breakfast.    [provider]  hydrALAZINE (APRESOLINE) 100 MG tablet Take 1 tablet (100 mg total) by mouth 3 (three) times daily. Please make yearly appt with Dr. Marlou Porch for April 2023 for future refills. Thank you 1st attempt Patient taking differently: Take 100 mg by mouth 3 (three) times daily.  06/01/21   Jerline Pain, MD  latanoprost (XALATAN) 0.005 % ophthalmic solution Place 1 drop into both eyes at bedtime. 02/05/20   [provider]  magnesium oxide (MAG-OX) 400 (240 Mg) MG tablet Take 1 tablet (400 mg total) by mouth daily. Patient taking differently: Take 400 mg by mouth 3 (three) times daily. 05/14/21   Lavina Hamman, MD  memantine (NAMENDA) 10 MG tablet Take 1 tablet (10 mg total) by mouth 2 (two) times daily. Patient taking differently: Take 10 mg by mouth in the morning and at bedtime. 03/15/21   Marcial Pacas, MD  metFORMIN (GLUCOPHAGE) 1000 MG tablet Take 1,000 mg by mouth 2 (two)  times daily with a meal.    [provider]  montelukast (SINGULAIR) 10 MG tablet Take 1 tablet (10 mg total) by mouth at bedtime. 05/25/21   Tanda Rockers, MD  Multiple Vitamin (MULTIVITAMIN WITH MINERALS) TABS tablet Take 1 tablet by mouth daily with breakfast.    [provider]  nebivolol (BYSTOLIC) 10 MG tablet Take 1 tablet (10 mg total) by mouth daily. 04/10/19   Jerline Pain, MD  nitroGLYCERIN (NITROSTAT) 0.4 MG SL tablet DISSOLVE ONE TABLET UNDER THE TONGUE EVERY 5 MINUTES AS NEEDED FOR CHEST PAIN.  DO NOT EXCEED A TOTAL OF 3 DOSES IN 15 MINUTES Patient taking differently: 0.4 mg every 5 (five) minutes as needed for chest pain. 11/19/19   Burtis Junes, NP  oxyCODONE-acetaminophen (PERCOCET/ROXICET) 5-325 MG tablet Take 1 tablet by mouth every 6 (six) hours as needed (for pain). 06/04/21   [provider]  potassium chloride SA (KLOR-CON M) 20 MEQ tablet Take 1 tablet (20 mEq total) by mouth every other day. 05/14/21   Lavina Hamman, MD  Probiotic Product (EQ PROBIOTIC) CAPS Take 1 capsule by mouth every morning.    [provider]      Allergies    Chlorthalidone and Tramadol    Review of Systems   Review of Systems  Physical Exam Updated Vital Signs BP (!) 153/121 (BP Location: Right Arm)    Pulse 93    Resp 20    LMP  (LMP Unknown)    SpO2 100%  Physical Exam Vitals and nursing note reviewed.  Constitutional:      Appearance: She is well-developed.  HENT:     Head: Normocephalic and atraumatic.     Mouth/Throat:     Mouth: Mucous membranes are moist.     Pharynx: Oropharynx is clear.  Eyes:     Pupils: Pupils are equal, round, and reactive to light.  Cardiovascular:     Rate and Rhythm: Normal rate and regular rhythm.  Pulmonary:     Effort: No respiratory distress.     Breath sounds: No stridor. Wheezing present.  Abdominal:     General: There is no distension.  Musculoskeletal:     Cervical back: Normal range of motion.   Skin:    General: Skin is warm and dry.  Neurological:     General: No focal deficit present.     Mental Status: She is alert.    ED Results / Procedures / Treatments   Labs (all labs ordered are listed, but only abnormal results are displayed) Labs Reviewed  RESP PANEL BY RT-PCR (FLU A&B, COVID) ARPGX2  CBC WITH DIFFERENTIAL/PLATELET  COMPREHENSIVE METABOLIC PANEL    EKG None  Radiology No results found.  Procedures Procedures    Medications Ordered in ED Medications  albuterol (PROVENTIL) (2.5  MG/3ML) 0.083% nebulizer solution (  Not Given 06/19/21 0506)  albuterol (PROVENTIL) (2.5 MG/3ML) 0.083% nebulizer solution (10 mg/hr Nebulization Not Given 06/19/21 0505)  magnesium sulfate IVPB 2 g 50 mL (2 g Intravenous New Bag/Given 06/19/21 0513)  ipratropium-albuterol (DUONEB) 0.5-2.5 (3) MG/3ML nebulizer solution 3 mL (3 mLs Nebulization Given 06/19/21 0447)  albuterol (PROVENTIL) (2.5 MG/3ML) 0.083% nebulizer solution 10 mg (10 mg Nebulization Given 06/19/21 0457)  methylPREDNISolone sodium succinate (SOLU-MEDROL) 125 mg/2 mL injection 125 mg (125 mg Intravenous Given 06/19/21 4888)    ED Course/ Medical Decision Making/ A&P                           Medical Decision Making Amount and/or Complexity of Data Reviewed Labs: ordered. Radiology: ordered.  Risk Prescription drug management. Decision regarding hospitalization.  May just be recurrent EDAC/TBM, however will get cxr and get some breathing treatmetns in and then speak with pulmonary on further input.    Discussed with Dr. Joeseph Amor about patient. Recommended CT scan and if no emergent findings, hospitalist admit for cpap trial and pulmonary consult. Didn't think emergent bronchoscopy was necessary.      Final Clinical Impression(s) / ED Diagnoses Final diagnoses:  Respiratory distress    Rx / DC Orders ED Discharge Orders     None         Aliyanah Rozas, Corene Cornea, MD 06/21/21 2340

## 2021-06-20 ENCOUNTER — Telehealth: Payer: Self-pay | Admitting: Physical Therapy

## 2021-06-20 ENCOUNTER — Encounter: Payer: Medicare Other | Admitting: Physical Therapy

## 2021-06-20 DIAGNOSIS — D638 Anemia in other chronic diseases classified elsewhere: Secondary | ICD-10-CM | POA: Diagnosis not present

## 2021-06-20 DIAGNOSIS — I2510 Atherosclerotic heart disease of native coronary artery without angina pectoris: Secondary | ICD-10-CM | POA: Diagnosis present

## 2021-06-20 DIAGNOSIS — N179 Acute kidney failure, unspecified: Secondary | ICD-10-CM | POA: Diagnosis present

## 2021-06-20 DIAGNOSIS — I5032 Chronic diastolic (congestive) heart failure: Secondary | ICD-10-CM | POA: Diagnosis present

## 2021-06-20 DIAGNOSIS — J4551 Severe persistent asthma with (acute) exacerbation: Secondary | ICD-10-CM | POA: Diagnosis present

## 2021-06-20 DIAGNOSIS — Z87891 Personal history of nicotine dependence: Secondary | ICD-10-CM | POA: Diagnosis not present

## 2021-06-20 DIAGNOSIS — E1165 Type 2 diabetes mellitus with hyperglycemia: Secondary | ICD-10-CM | POA: Diagnosis present

## 2021-06-20 DIAGNOSIS — K219 Gastro-esophageal reflux disease without esophagitis: Secondary | ICD-10-CM | POA: Diagnosis present

## 2021-06-20 DIAGNOSIS — N1832 Chronic kidney disease, stage 3b: Secondary | ICD-10-CM | POA: Diagnosis present

## 2021-06-20 DIAGNOSIS — Z8249 Family history of ischemic heart disease and other diseases of the circulatory system: Secondary | ICD-10-CM | POA: Diagnosis not present

## 2021-06-20 DIAGNOSIS — D72829 Elevated white blood cell count, unspecified: Secondary | ICD-10-CM | POA: Diagnosis not present

## 2021-06-20 DIAGNOSIS — D631 Anemia in chronic kidney disease: Secondary | ICD-10-CM | POA: Diagnosis present

## 2021-06-20 DIAGNOSIS — I130 Hypertensive heart and chronic kidney disease with heart failure and stage 1 through stage 4 chronic kidney disease, or unspecified chronic kidney disease: Secondary | ICD-10-CM | POA: Diagnosis present

## 2021-06-20 DIAGNOSIS — E1122 Type 2 diabetes mellitus with diabetic chronic kidney disease: Secondary | ICD-10-CM | POA: Diagnosis present

## 2021-06-20 DIAGNOSIS — E871 Hypo-osmolality and hyponatremia: Secondary | ICD-10-CM | POA: Diagnosis not present

## 2021-06-20 DIAGNOSIS — F0394 Unspecified dementia, unspecified severity, with anxiety: Secondary | ICD-10-CM | POA: Diagnosis present

## 2021-06-20 DIAGNOSIS — J383 Other diseases of vocal cords: Principal | ICD-10-CM

## 2021-06-20 DIAGNOSIS — E669 Obesity, unspecified: Secondary | ICD-10-CM | POA: Diagnosis present

## 2021-06-20 DIAGNOSIS — R0603 Acute respiratory distress: Secondary | ICD-10-CM | POA: Diagnosis present

## 2021-06-20 DIAGNOSIS — I10 Essential (primary) hypertension: Secondary | ICD-10-CM | POA: Diagnosis not present

## 2021-06-20 DIAGNOSIS — Y92239 Unspecified place in hospital as the place of occurrence of the external cause: Secondary | ICD-10-CM | POA: Diagnosis not present

## 2021-06-20 DIAGNOSIS — Z90710 Acquired absence of both cervix and uterus: Secondary | ICD-10-CM | POA: Diagnosis not present

## 2021-06-20 DIAGNOSIS — I252 Old myocardial infarction: Secondary | ICD-10-CM | POA: Diagnosis not present

## 2021-06-20 DIAGNOSIS — E785 Hyperlipidemia, unspecified: Secondary | ICD-10-CM | POA: Diagnosis present

## 2021-06-20 DIAGNOSIS — Z20822 Contact with and (suspected) exposure to covid-19: Secondary | ICD-10-CM | POA: Diagnosis present

## 2021-06-20 DIAGNOSIS — F32A Depression, unspecified: Secondary | ICD-10-CM | POA: Diagnosis present

## 2021-06-20 DIAGNOSIS — Z955 Presence of coronary angioplasty implant and graft: Secondary | ICD-10-CM | POA: Diagnosis not present

## 2021-06-20 DIAGNOSIS — T380X5A Adverse effect of glucocorticoids and synthetic analogues, initial encounter: Secondary | ICD-10-CM | POA: Diagnosis not present

## 2021-06-20 LAB — COMPREHENSIVE METABOLIC PANEL
ALT: 20 U/L (ref 0–44)
AST: 20 U/L (ref 15–41)
Albumin: 4.4 g/dL (ref 3.5–5.0)
Alkaline Phosphatase: 69 U/L (ref 38–126)
Anion gap: 13 (ref 5–15)
BUN: 38 mg/dL — ABNORMAL HIGH (ref 8–23)
CO2: 19 mmol/L — ABNORMAL LOW (ref 22–32)
Calcium: 9.7 mg/dL (ref 8.9–10.3)
Chloride: 100 mmol/L (ref 98–111)
Creatinine, Ser: 1.62 mg/dL — ABNORMAL HIGH (ref 0.44–1.00)
GFR, Estimated: 34 mL/min — ABNORMAL LOW (ref >60)
Glucose, Bld: 340 mg/dL — ABNORMAL HIGH (ref 70–99)
Potassium: 4.6 mmol/L (ref 3.5–5.1)
Sodium: 132 mmol/L — ABNORMAL LOW (ref 135–145)
Total Bilirubin: 0.3 mg/dL (ref 0.3–1.2)
Total Protein: 7.8 g/dL (ref 6.5–8.1)

## 2021-06-20 LAB — GLUCOSE, CAPILLARY
Glucose-Capillary: 326 mg/dL — ABNORMAL HIGH (ref 70–99)
Glucose-Capillary: 252 mg/dL — ABNORMAL HIGH (ref 70–99)
Glucose-Capillary: 304 mg/dL — ABNORMAL HIGH (ref 70–99)
Glucose-Capillary: 301 mg/dL — ABNORMAL HIGH (ref 70–99)

## 2021-06-20 LAB — CBC
HCT: 34.9 % — ABNORMAL LOW (ref 36.0–46.0)
Hemoglobin: 11.3 g/dL — ABNORMAL LOW (ref 12.0–15.0)
MCH: 31.8 pg (ref 26.0–34.0)
MCHC: 32.4 g/dL (ref 30.0–36.0)
MCV: 98.3 fL (ref 80.0–100.0)
Platelets: 334 10*3/uL (ref 150–400)
RBC: 3.55 MIL/uL — ABNORMAL LOW (ref 3.87–5.11)
RDW: 15 % (ref 11.5–15.5)
WBC: 21.2 10*3/uL — ABNORMAL HIGH (ref 4.0–10.5)
nRBC: 0 % (ref 0.0–0.2)

## 2021-06-20 MED ORDER — CHLORPHENIRAMINE MALEATE 4 MG PO TABS
4.0000 mg | ORAL_TABLET | ORAL | Status: DC
Start: 2021-06-20 — End: 2021-06-24
  Administered 2021-06-20 – 2021-06-24 (×24): 4 mg via ORAL
  Filled 2021-06-20 (×26): qty 1

## 2021-06-20 MED ORDER — ALUM & MAG HYDROXIDE-SIMETH 200-200-20 MG/5ML PO SUSP
30.0000 mL | ORAL | Status: DC | PRN
Start: 2021-06-20 — End: 2021-06-24
  Administered 2021-06-20 – 2021-06-23 (×4): 30 mL via ORAL
  Filled 2021-06-20 (×4): qty 30

## 2021-06-20 NOTE — Assessment & Plan Note (Addendum)
Hyperglycemia -Blood sugars still elevated.  Carb modified diet.  Resume home regimen.  Outpatient follow-up.

## 2021-06-20 NOTE — TOC Progression Note (Signed)
Transition of Care [facility]) - Progression Note    Patient Details  Name: [name] MRN: [id number] Date of Birth: 03/02/1951  Transition of Care [facility] CM/SW Contact  Purcell Mouton, RN Phone Number: 06/20/2021, 3:12 PM  Clinical Narrative:    Spoke with pt briefly. TOC will continue to follow.   Expected Discharge Plan: [place] Barriers to Discharge: No Barriers Identified  Expected Discharge Plan and Services Expected Discharge Plan: [place]   Discharge Planning Services: CM Consult Post Acute Care Choice: Home Health, Durable Medical Equipment Living arrangements for the past 2 months: Single Family Home                                       Social Determinants of Health (SDOH) Interventions    Readmission Risk Interventions No flowsheet data found.

## 2021-06-20 NOTE — Progress Notes (Signed)
Pt refused CPAP qhs.  Pt states she is unable to tolerate the mask or the pressure.  Pt states she is going to keep trying to put it on some when she is awake in an attempt to get used to it.  Pt encouraged to contact RT when she is ready to try again.

## 2021-06-20 NOTE — Hospital Course (Addendum)
71 yo female with PMH of severe persistent asthma, vocal cord dysfunction, CAD, chronic diastolic CHF, depression/anxiety, HLD, DMII, GERD, HTN, seizure, CKD3a  presented with recurrent SOB. She has had recurrent hospitalizations for similar in January.  She was admitted and started on IV steroids.  Pulmonary was consulted.  Subsequently, ENT was consulted: Flexible fiberoptic laryngoscopy was unremarkable.  ENT recommended no airway intervention needed and recommended treatment for chronic reflux.  She is still wheezing some but feels better.  Pulmonary has signed off.  She will be discharged home today on oral prednisone, Protonix, Carafate, antihistamines and as needed Maalox.  Outpatient follow-up with pulmonary/ENT and PCP.

## 2021-06-20 NOTE — Telephone Encounter (Signed)
Pt was called due to no show; she is in the hospital currently due to difficulty breathing, has been having issues with this on and off lately.  She is planning to try to reschedule when she gets out  [employer], PT 06/20/21 3:17 PM

## 2021-06-20 NOTE — Assessment & Plan Note (Signed)
Continue Protonix °

## 2021-06-20 NOTE — Assessment & Plan Note (Addendum)
-   Continue nebivolol, Cardizem and hydralazine.  Blood pressures still elevated.  Might need adjustment in regimen as an outpatient by PCP.

## 2021-06-20 NOTE — Assessment & Plan Note (Signed)
-   Continue Xanax 

## 2021-06-20 NOTE — Assessment & Plan Note (Addendum)
-   Patient follows closely with pulmonology outpatient.  She has also had recent ENT evaluation; she is recommended to follow-up outpatient with [facility] for possible injections -Treated with IV steroids along with breathing treatments and chlorpheniramine tablets. Subsequently, ENT was consulted: Flexible fiberoptic laryngoscopy was unremarkable.  ENT recommended no airway intervention needed and recommended treatment for chronic reflux.  -Outpatient follow-up with ENT.  Discharge patient home today.

## 2021-06-20 NOTE — Progress Notes (Signed)
Progress Note   Patient: [name] QIO:[id number] DOB: 1950-06-16 DOA: 06/19/2021     0 DOS: the patient was seen and examined on 06/20/2021   Brief hospital course: [name] is a 71 yo female with PMH severe persistent asthma, vocal cord dysfunction (VCD) CAD, dCHF, depression/anxiety, HLD, DMII, GERD, HTN, hx seizure, CKD3a who presented with recurrent SOB. She has had recurrent hospitalizations for similar in January. She follows closely with pulmonology as well.  She did endorse on admission that she was around a sick grandchild with a respiratory illness this past week.  Due to developing wheezing and SOB, she presented for evaluation.   Assessment and Plan: Vocal cord dysfunction- (present on admission) - Patient follows closely with pulmonology outpatient.  She has also had recent ENT evaluation; she is recommended to follow-up outpatient with [facility] for possible injections - In the meantime, per pulmonology, she is recommended to continue on steroids, breathing treatments, and chlorphentermine scheduled  Asthma exacerbation- (present on admission) - Continue CPAP, breathing treatments, steroids  Anxiety - Continue Xanax  Diabetes mellitus type 2, controlled ([place]) - Continue SSI and CBG monitoring  GERD (gastroesophageal reflux disease)- (present on admission) - Continue Protonix  Essential hypertension- (present on admission) - Continue nebivolol    Subjective: Seen this morning in room with CPAP in place.  She was breathing comfortably and in no distress. She was able to carry on full conversation easily with mask in place.  Physical Exam: Vitals:   06/20/21 0353 06/20/21 0458 06/20/21 0637 06/20/21 0736  BP: (!) 188/87 (!) 187/96    Pulse: 96 94 87   Resp: 20 18 20    Temp: 97.8 F (36.6 C) 98.3 F (36.8 C)    TempSrc: Oral Oral    SpO2: 100% 100% 99% 98%  Weight:      Height:      Physical Exam Constitutional:      General: She is not in acute  distress.    Appearance: Normal appearance.  HENT:     Head: Normocephalic and atraumatic.     Mouth/Throat:     Mouth: Mucous membranes are moist.  Eyes:     Extraocular Movements: Extraocular movements intact.  Cardiovascular:     Rate and Rhythm: Normal rate and regular rhythm.  Pulmonary:     Comments: Tight breath sounds with decreased air movement and some expiratory wheezing Abdominal:     General: Bowel sounds are normal. There is no distension.     Palpations: Abdomen is soft.     Tenderness: There is no abdominal tenderness.  Musculoskeletal:        General: No swelling. Normal range of motion.     Cervical back: Normal range of motion and neck supple.  Skin:    General: Skin is warm and dry.  Neurological:     General: No focal deficit present.     Mental Status: She is alert and oriented to person, place, and time.  Psychiatric:        Mood and Affect: Mood normal.        Behavior: Behavior normal.     Data Reviewed:  Results for orders placed or performed during the hospital encounter of 06/19/21 (from the past 24 hour(s))  CBG monitoring, ED     Status: Abnormal   Collection Time: 06/19/21 12:11 PM  Result Value Ref Range   Glucose-Capillary 269 (H) 70 - 99 mg/dL  Glucose, capillary     Status: Abnormal   Collection  Time: 06/19/21  2:58 PM  Result Value Ref Range   Glucose-Capillary 233 (H) 70 - 99 mg/dL  Glucose, capillary     Status: Abnormal   Collection Time: 06/19/21  4:42 PM  Result Value Ref Range   Glucose-Capillary 195 (H) 70 - 99 mg/dL  Glucose, capillary     Status: Abnormal   Collection Time: 06/19/21  9:34 PM  Result Value Ref Range   Glucose-Capillary 276 (H) 70 - 99 mg/dL  Comprehensive metabolic panel     Status: Abnormal   Collection Time: 06/20/21  5:13 AM  Result Value Ref Range   Sodium 132 (L) 135 - 145 mmol/L   Potassium 4.6 3.5 - 5.1 mmol/L   Chloride 100 98 - 111 mmol/L   CO2 19 (L) 22 - 32 mmol/L   Glucose, Bld 340 (H) 70  - 99 mg/dL   BUN 38 (H) 8 - 23 mg/dL   Creatinine, Ser 1.62 (H) 0.44 - 1.00 mg/dL   Calcium 9.7 8.9 - 10.3 mg/dL   Total Protein 7.8 6.5 - 8.1 g/dL   Albumin 4.4 3.5 - 5.0 g/dL   AST 20 15 - 41 U/L   ALT 20 0 - 44 U/L   Alkaline Phosphatase 69 38 - 126 U/L   Total Bilirubin 0.3 0.3 - 1.2 mg/dL   GFR, Estimated 34 (L) >60 mL/min   Anion gap 13 5 - 15  CBC     Status: Abnormal   Collection Time: 06/20/21  5:13 AM  Result Value Ref Range   WBC 21.2 (H) 4.0 - 10.5 K/uL   RBC 3.55 (L) 3.87 - 5.11 MIL/uL   Hemoglobin 11.3 (L) 12.0 - 15.0 g/dL   HCT 34.9 (L) 36.0 - 46.0 %   MCV 98.3 80.0 - 100.0 fL   MCH 31.8 26.0 - 34.0 pg   MCHC 32.4 30.0 - 36.0 g/dL   RDW 15.0 11.5 - 15.5 %   Platelets 334 150 - 400 K/uL   nRBC 0.0 0.0 - 0.2 %  Glucose, capillary     Status: Abnormal   Collection Time: 06/20/21  7:48 AM  Result Value Ref Range   Glucose-Capillary 326 (H) 70 - 99 mg/dL  Glucose, capillary     Status: Abnormal   Collection Time: 06/20/21 11:41 AM  Result Value Ref Range   Glucose-Capillary 252 (H) 70 - 99 mg/dL    I have Reviewed nursing notes, Vitals, and Lab results since pt's last encounter. Pertinent lab results : see above I have ordered test including BMP, CBC, Mg I have reviewed the last note from staff over past 24 hours I have discussed pt's care plan and test results with nursing staff, case manager  Antimicrobials:   Consultants: Pulm  Procedures:    DVT ppx:  enoxaparin (LOVENOX) injection 40 mg Start: 06/19/21 1800     Code Status: Full Code    Family Communication:   Disposition: Status is: Observation The patient will require care spanning > 2 midnights and should be moved to inpatient because: Treatment as outlined in A&P    Planned Discharge Destination: Home    Author: Dwyane Dee, MD 06/20/2021 12:06 PM  For on call review [email or web address].

## 2021-06-20 NOTE — Assessment & Plan Note (Addendum)
-   Pulmonary signed off on 06/23/2021.  Wheezing most likely from upper airway as per pulmonary. -Treated with IV steroids and nebs.  Switch to oral prednisone 40 mg daily for 7 days.  Continue nebs.  Continue inhaled steroid.  Outpatient follow-up with pulmonary.  Continue Singulair

## 2021-06-21 DIAGNOSIS — N1832 Chronic kidney disease, stage 3b: Secondary | ICD-10-CM

## 2021-06-21 DIAGNOSIS — N179 Acute kidney failure, unspecified: Secondary | ICD-10-CM

## 2021-06-21 LAB — CBC WITH DIFFERENTIAL/PLATELET
Abs Immature Granulocytes: 0.28 10*3/uL — ABNORMAL HIGH (ref 0.00–0.07)
Basophils Absolute: 0 10*3/uL (ref 0.0–0.1)
Basophils Relative: 0 %
Eosinophils Absolute: 0 10*3/uL (ref 0.0–0.5)
Eosinophils Relative: 0 %
HCT: 32.8 % — ABNORMAL LOW (ref 36.0–46.0)
Hemoglobin: 10.5 g/dL — ABNORMAL LOW (ref 12.0–15.0)
Immature Granulocytes: 1 %
Lymphocytes Relative: 4 %
Lymphs Abs: 0.8 10*3/uL (ref 0.7–4.0)
MCH: 31.5 pg (ref 26.0–34.0)
MCHC: 32 g/dL (ref 30.0–36.0)
MCV: 98.5 fL (ref 80.0–100.0)
Monocytes Absolute: 1 10*3/uL (ref 0.1–1.0)
Monocytes Relative: 5 %
Neutro Abs: 17.7 10*3/uL — ABNORMAL HIGH (ref 1.7–7.7)
Neutrophils Relative %: 90 %
Platelets: 319 10*3/uL (ref 150–400)
RBC: 3.33 MIL/uL — ABNORMAL LOW (ref 3.87–5.11)
RDW: 14.9 % (ref 11.5–15.5)
WBC: 19.7 10*3/uL — ABNORMAL HIGH (ref 4.0–10.5)
nRBC: 0 % (ref 0.0–0.2)

## 2021-06-21 LAB — BASIC METABOLIC PANEL
Anion gap: 10 (ref 5–15)
BUN: 56 mg/dL — ABNORMAL HIGH (ref 8–23)
CO2: 20 mmol/L — ABNORMAL LOW (ref 22–32)
Calcium: 9.4 mg/dL (ref 8.9–10.3)
Chloride: 100 mmol/L (ref 98–111)
Creatinine, Ser: 1.93 mg/dL — ABNORMAL HIGH (ref 0.44–1.00)
GFR, Estimated: 27 mL/min — ABNORMAL LOW (ref >60)
Glucose, Bld: 289 mg/dL — ABNORMAL HIGH (ref 70–99)
Potassium: 4.9 mmol/L (ref 3.5–5.1)
Sodium: 130 mmol/L — ABNORMAL LOW (ref 135–145)

## 2021-06-21 LAB — MAGNESIUM: Magnesium: 2.5 mg/dL — ABNORMAL HIGH (ref 1.7–2.4)

## 2021-06-21 LAB — GLUCOSE, CAPILLARY
Glucose-Capillary: 273 mg/dL — ABNORMAL HIGH (ref 70–99)
Glucose-Capillary: 371 mg/dL — ABNORMAL HIGH (ref 70–99)
Glucose-Capillary: 312 mg/dL — ABNORMAL HIGH (ref 70–99)
Glucose-Capillary: 250 mg/dL — ABNORMAL HIGH (ref 70–99)

## 2021-06-21 MED ORDER — SODIUM CHLORIDE 0.9 % IV SOLN: INTRAVENOUS | Status: DC

## 2021-06-21 MED ORDER — IPRATROPIUM-ALBUTEROL 0.5-2.5 (3) MG/3ML IN SOLN
3.0000 mL | Freq: Two times a day (BID) | RESPIRATORY_TRACT | Status: DC
  Administered 2021-06-22 – 2021-06-24 (×5): 3 mL via RESPIRATORY_TRACT
  Filled 2021-06-21 (×5): qty 3

## 2021-06-21 MED ORDER — ENOXAPARIN SODIUM 30 MG/0.3ML IJ SOSY
30.0000 mg | PREFILLED_SYRINGE | INTRAMUSCULAR | Status: DC
  Administered 2021-06-21 – 2021-06-22 (×2): 30 mg via SUBCUTANEOUS
  Filled 2021-06-21 (×2): qty 0.3

## 2021-06-21 MED ORDER — MORPHINE SULFATE (PF) 2 MG/ML IV SOLN
1.0000 mg | INTRAVENOUS | Status: DC | PRN
  Administered 2021-06-21 – 2021-06-23 (×6): 1 mg via INTRAVENOUS
  Filled 2021-06-21 (×6): qty 1

## 2021-06-21 NOTE — Progress Notes (Signed)
Progress Note   Patient: [name] [id number]:[id number] DOB: June 17, 1950 DOA: 06/19/2021     1 DOS: the patient was seen and examined on 06/21/2021   Brief hospital course: [name] is a 71 yo female with PMH severe persistent asthma, vocal cord dysfunction (VCD) CAD, dCHF, depression/anxiety, HLD, DMII, GERD, HTN, hx seizure, CKD3a who presented with recurrent SOB. She has had recurrent hospitalizations for similar in January. She follows closely with pulmonology as well.  She did endorse on admission that she was around a sick grandchild with a respiratory illness this past week.  Due to developing wheezing and SOB, she presented for evaluation.   Assessment and Plan: * Vocal cord dysfunction- (present on admission) - Patient follows closely with pulmonology outpatient.  She has also had recent ENT evaluation; she is recommended to follow-up outpatient with [facility] for possible injections - In the meantime, per pulmonology, she is recommended to continue on steroids, breathing treatments, and chlorphenirmine scheduled (of note, she prefers going home back on tussionex instead of the chlorpheniramine tablets)  Asthma exacerbation- (present on admission) - Continue CPAP, breathing treatments, steroids  Acute renal failure superimposed on stage 3b chronic kidney disease ([place])- (present on admission) - patient has history of CKD3b. Baseline creat ~ 1.3 - 1.4, eGFR 40-43 - patient presents with increase in creat >0.3 mg/dL above baseline, creat increase >1.5x baseline presumed to have occurred within past 7 days PTA - creat up to 1.93 today - trial of IVF overnight and repeat BMP in am   Anxiety - Continue Xanax  Diabetes mellitus type 2, controlled ([place]) - Continue SSI and CBG monitoring  GERD (gastroesophageal reflux disease)- (present on admission) - Continue Protonix  Essential hypertension- (present on admission) - Continue nebivolol    Subjective: Seen on room air this morning.   Does have significant wheezing appreciated from her neck consistent with upper airway.  Does still have some evidence of ongoing asthma exacerbation and she is responding fairly well to steroid course. We discussed 1 more night in the hospital and reevaluating tomorrow for possible discharge home.  Physical Exam: Vitals:   06/21/21 0551 06/21/21 0814 06/21/21 0923 06/21/21 1230  BP: (!) 167/78  (!) 182/83 (!) 182/75  Pulse: 74   76  Resp: 20   20  Temp: 97.6 F (36.4 C)   98 F (36.7 C)  TempSrc: Oral   Oral  SpO2: 92% 94%  100%  Weight:      Height:      Physical Exam Constitutional:      General: She is not in acute distress.    Appearance: Normal appearance.  HENT:     Head: Normocephalic and atraumatic.     Mouth/Throat:     Mouth: Mucous membranes are moist.  Eyes:     Extraocular Movements: Extraocular movements intact.  Cardiovascular:     Rate and Rhythm: Normal rate and regular rhythm.  Pulmonary:     Comments: Greatly improved air movement with still ongoing expiratory wheezing. Most amount of wheezing appreciated in neck region but not consistent with true stridor Abdominal:     General: Bowel sounds are normal. There is no distension.     Palpations: Abdomen is soft.     Tenderness: There is no abdominal tenderness.  Musculoskeletal:        General: No swelling. Normal range of motion.     Cervical back: Normal range of motion and neck supple.  Skin:    General: Skin is warm  and dry.  Neurological:     General: No focal deficit present.     Mental Status: She is alert and oriented to person, place, and time.  Psychiatric:        Mood and Affect: Mood normal.        Behavior: Behavior normal.    Data Reviewed:  Results for orders placed or performed during the hospital encounter of 06/19/21 (from the past 24 hour(s))  Glucose, capillary     Status: Abnormal   Collection Time: 06/20/21  4:24 PM  Result Value Ref Range   Glucose-Capillary 304 (H) 70 - 99  mg/dL  Glucose, capillary     Status: Abnormal   Collection Time: 06/20/21  8:28 PM  Result Value Ref Range   Glucose-Capillary 301 (H) 70 - 99 mg/dL  Basic metabolic panel     Status: Abnormal   Collection Time: 06/21/21  4:07 AM  Result Value Ref Range   Sodium 130 (L) 135 - 145 mmol/L   Potassium 4.9 3.5 - 5.1 mmol/L   Chloride 100 98 - 111 mmol/L   CO2 20 (L) 22 - 32 mmol/L   Glucose, Bld 289 (H) 70 - 99 mg/dL   BUN 56 (H) 8 - 23 mg/dL   Creatinine, Ser 1.93 (H) 0.44 - 1.00 mg/dL   Calcium 9.4 8.9 - 10.3 mg/dL   GFR, Estimated 27 (L) >60 mL/min   Anion gap 10 5 - 15  CBC with Differential/Platelet     Status: Abnormal   Collection Time: 06/21/21  4:07 AM  Result Value Ref Range   WBC 19.7 (H) 4.0 - 10.5 K/uL   RBC 3.33 (L) 3.87 - 5.11 MIL/uL   Hemoglobin 10.5 (L) 12.0 - 15.0 g/dL   HCT 32.8 (L) 36.0 - 46.0 %   MCV 98.5 80.0 - 100.0 fL   MCH 31.5 26.0 - 34.0 pg   MCHC 32.0 30.0 - 36.0 g/dL   RDW 14.9 11.5 - 15.5 %   Platelets 319 150 - 400 K/uL   nRBC 0.0 0.0 - 0.2 %   Neutrophils Relative % 90 %   Neutro Abs 17.7 (H) 1.7 - 7.7 K/uL   Lymphocytes Relative 4 %   Lymphs Abs 0.8 0.7 - 4.0 K/uL   Monocytes Relative 5 %   Monocytes Absolute 1.0 0.1 - 1.0 K/uL   Eosinophils Relative 0 %   Eosinophils Absolute 0.0 0.0 - 0.5 K/uL   Basophils Relative 0 %   Basophils Absolute 0.0 0.0 - 0.1 K/uL   Immature Granulocytes 1 %   Abs Immature Granulocytes 0.28 (H) 0.00 - 0.07 K/uL  Magnesium     Status: Abnormal   Collection Time: 06/21/21  4:07 AM  Result Value Ref Range   Magnesium 2.5 (H) 1.7 - 2.4 mg/dL  Glucose, capillary     Status: Abnormal   Collection Time: 06/21/21  7:33 AM  Result Value Ref Range   Glucose-Capillary 273 (H) 70 - 99 mg/dL  Glucose, capillary     Status: Abnormal   Collection Time: 06/21/21 11:38 AM  Result Value Ref Range   Glucose-Capillary 371 (H) 70 - 99 mg/dL    I have Reviewed nursing notes, Vitals, and Lab results since pt's last encounter.  Pertinent lab results : see above I have ordered test including BMP, CBC, Mg I have reviewed the last note from staff over past 24 hours I have discussed pt's care plan and test results with nursing staff, case manager  Antimicrobials:  Consultants: Pulm  Procedures:    DVT ppx:  enoxaparin (LOVENOX) injection 30 mg Start: 06/21/21 1800     Code Status: Full Code    Family Communication:   Disposition: Status is: Inpatient    Planned Discharge Destination: Home    Author: Dwyane Dee, MD 06/21/2021 1:40 PM  For on call review [email or web address].

## 2021-06-21 NOTE — Plan of Care (Signed)
  Problem: Activity: Goal: Ability to tolerate increased activity will improve Outcome: Progressing Goal: Will verbalize the importance of balancing activity with adequate rest periods Outcome: Progressing   Problem: Respiratory: Goal: Levels of oxygenation will improve Outcome: Progressing Goal: Ability to maintain adequate ventilation will improve Outcome: Progressing   

## 2021-06-21 NOTE — Assessment & Plan Note (Addendum)
-  Baseline creat ~ 1.3 - 1.4 -Creatinine was 1.93 on 06/21/2021.  Treated with IV fluids.  Creatinine 1.38 today.  Of IV fluids.  Outpatient follow-up.

## 2021-06-21 NOTE — Progress Notes (Signed)
Patient continues to decline nocturnal CPAP tonight. Equipment remains on standby

## 2021-06-22 DIAGNOSIS — D72829 Elevated white blood cell count, unspecified: Secondary | ICD-10-CM

## 2021-06-22 DIAGNOSIS — E871 Hypo-osmolality and hyponatremia: Secondary | ICD-10-CM

## 2021-06-22 DIAGNOSIS — E669 Obesity, unspecified: Secondary | ICD-10-CM

## 2021-06-22 DIAGNOSIS — D638 Anemia in other chronic diseases classified elsewhere: Secondary | ICD-10-CM

## 2021-06-22 DIAGNOSIS — I10 Essential (primary) hypertension: Secondary | ICD-10-CM

## 2021-06-22 LAB — CBC WITH DIFFERENTIAL/PLATELET
Abs Immature Granulocytes: 0.3 10*3/uL — ABNORMAL HIGH (ref 0.00–0.07)
Basophils Absolute: 0 10*3/uL (ref 0.0–0.1)
Basophils Relative: 0 %
Eosinophils Absolute: 0 10*3/uL (ref 0.0–0.5)
Eosinophils Relative: 0 %
HCT: 34.8 % — ABNORMAL LOW (ref 36.0–46.0)
Hemoglobin: 11 g/dL — ABNORMAL LOW (ref 12.0–15.0)
Immature Granulocytes: 2 %
Lymphocytes Relative: 4 %
Lymphs Abs: 0.7 10*3/uL (ref 0.7–4.0)
MCH: 31.2 pg (ref 26.0–34.0)
MCHC: 31.6 g/dL (ref 30.0–36.0)
MCV: 98.6 fL (ref 80.0–100.0)
Monocytes Absolute: 1.2 10*3/uL — ABNORMAL HIGH (ref 0.1–1.0)
Monocytes Relative: 6 %
Neutro Abs: 18.2 10*3/uL — ABNORMAL HIGH (ref 1.7–7.7)
Neutrophils Relative %: 88 %
Platelets: 321 10*3/uL (ref 150–400)
RBC: 3.53 MIL/uL — ABNORMAL LOW (ref 3.87–5.11)
RDW: 15.2 % (ref 11.5–15.5)
WBC: 20.5 10*3/uL — ABNORMAL HIGH (ref 4.0–10.5)
nRBC: 0 % (ref 0.0–0.2)

## 2021-06-22 LAB — BASIC METABOLIC PANEL
Anion gap: 7 (ref 5–15)
BUN: 46 mg/dL — ABNORMAL HIGH (ref 8–23)
CO2: 22 mmol/L (ref 22–32)
Calcium: 8.9 mg/dL (ref 8.9–10.3)
Chloride: 102 mmol/L (ref 98–111)
Creatinine, Ser: 1.45 mg/dL — ABNORMAL HIGH (ref 0.44–1.00)
GFR, Estimated: 38 mL/min — ABNORMAL LOW (ref >60)
Glucose, Bld: 306 mg/dL — ABNORMAL HIGH (ref 70–99)
Potassium: 4.8 mmol/L (ref 3.5–5.1)
Sodium: 131 mmol/L — ABNORMAL LOW (ref 135–145)

## 2021-06-22 LAB — GLUCOSE, CAPILLARY
Glucose-Capillary: 332 mg/dL — ABNORMAL HIGH (ref 70–99)
Glucose-Capillary: 292 mg/dL — ABNORMAL HIGH (ref 70–99)
Glucose-Capillary: 420 mg/dL — ABNORMAL HIGH (ref 70–99)
Glucose-Capillary: 364 mg/dL — ABNORMAL HIGH (ref 70–99)

## 2021-06-22 LAB — MAGNESIUM: Magnesium: 2.6 mg/dL — ABNORMAL HIGH (ref 1.7–2.4)

## 2021-06-22 MED ORDER — INSULIN GLARGINE-YFGN 100 UNIT/ML ~~LOC~~ SOLN
10.0000 [IU] | Freq: Every day | SUBCUTANEOUS | Status: DC
  Administered 2021-06-22: 10 [IU] via SUBCUTANEOUS
  Filled 2021-06-22 (×2): qty 0.1

## 2021-06-22 MED ORDER — MOMETASONE FURO-FORMOTEROL FUM 100-5 MCG/ACT IN AERO
2.0000 | INHALATION_SPRAY | Freq: Two times a day (BID) | RESPIRATORY_TRACT | Status: DC
  Administered 2021-06-22 – 2021-06-24 (×5): 2 via RESPIRATORY_TRACT
  Filled 2021-06-22: qty 8.8

## 2021-06-22 MED ORDER — INSULIN ASPART 100 UNIT/ML IJ SOLN
7.0000 [IU] | Freq: Three times a day (TID) | INTRAMUSCULAR | Status: DC
  Administered 2021-06-22: 7 [IU] via SUBCUTANEOUS

## 2021-06-22 NOTE — Progress Notes (Signed)
Inpatient Diabetes Program Recommendations  AACE/ADA: New Consensus Statement on Inpatient Glycemic Control (2015)  Target Ranges:  Prepandial:   less than 140 mg/dL      Peak postprandial:   less than 180 mg/dL (1-2 hours)      Critically ill patients:  140 - 180 mg/dL   Lab Results  Component Value Date   GLUCAP 332 (H) 06/22/2021   HGBA1C 8.2 (H) 06/05/2021    Review of Glycemic Control  Diabetes history: DM2 Outpatient Diabetes medications: glipizide 5 mg QD, metformin 1000 mg BID Current orders for Inpatient glycemic control: Novolog 0-15 units TID and 0-5 HS  HgbA1C - 8.2% Hyperglycemia in setting of steroids  Inpatient Diabetes Program Recommendations:    Consider adding low-dose basal - Semglee 8 units QD Add Novolog 3 units TID with meals if eating > 50%  Will continue to monitor glucose trends.  Thank you. Lorenda Peck, RD, LDN, CDE Inpatient Diabetes Coordinator [phone]

## 2021-06-22 NOTE — Progress Notes (Addendum)
Flutter valve order. Pt already had one at bedside. Pt  knows and understands how to use.

## 2021-06-22 NOTE — Assessment & Plan Note (Signed)
-   Possibly from renal failure.  Hemoglobin stable.

## 2021-06-22 NOTE — Consult Note (Signed)
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 [place] related sev days prior to admit and wheeze refractory to nebs > admit am 2/19 and PCCM consult requested      Significant Hospital Events: Including procedures, antibiotic start and stop dates in addition to other pertinent events   Admit am 2/19 with refractory upper airway wheez  - No response to ER rx including IV steroids and MgS04.  She did endorse sick contact with grandchild 06/20/2021: Had used CPAP the previous night.  Breathing was comfortable. 06/21/2021: Continued vocal cord issues.  Being treated with steroids breathing treatment chlorpheniramine.  Requesting Tussionex.  Creatinine worse at 1.93 mg percent [baseline creatinine 1.3-1.4].  Noticed to be on nebivolol. 06/22/2021: Wheezing and not able to go home.  BUN 46/creatinine 1.45.  Interim History / Subjective:    2/23 -called back because of persistent wheezing even while walking.  When CCM MD walked into the room she was sleeping as soon as she is CCM MD she started wheezing.  Wheezing appears particularly forced expiratory. On solumedrol 60mg  IV Q12h.    - On chlorpheniramine, Flonase, Mucinex, DuoNeb, Dulera, Singulair  -Review of historic labs for the last 12 years at 11 years showed no eosinophilia  = 2017 and 2019 had normal IgE but blood RAST allergy panel positive for grasses and weeds and sheep  Objective   Blood pressure (!) 182/87, pulse 82, temperature 97.7 F (36.5 C), temperature source Oral, resp. rate 20, height 5\' 2"  (1.575 m), weight 77.6 kg, SpO2 100 %.        Intake/Output Summary (Last 24 hours) at 06/22/2021 1724 Last data filed at  06/22/2021 0600 Gross per 24 hour  Intake 1108.87 ml  Output --  Net 1108.87 ml   Filed Weights   06/19/21 1834  Weight: 77.6 kg   General Appearance:  Looks well when quiet but significant expiratory wheezing coming from the upper airway when awake and when she noticed CCM MD Head:  Normocephalic, without obvious abnormality, atraumatic Eyes:  PERRL - no, conjunctiva/corneas - no     Ears:  Normal external ear canals, both ears Nose:  G tube - no Throat:  ETT TUBE - no , OG tube - no Neck:  Supple,  No enlargement/tenderness/nodules Lungs: Clear to auscultation bilaterally, significant upper airway wheezing Heart:  S1 and S2 normal, no murmur, CVP - no.  Pressors - no Abdomen:  Soft, no masses, no organomegaly Genitalia / Rectal:  Not done Extremities:  Extremities- intact Skin:  ntact in exposed areas . Sacral area - not examined Neurologic:  Sedation - none -> RASS - +1 . Moves all 4s - yes. CAM-ICU - neg . Orientation - x3+       Assessment & Plan:  1)  Classic VCD with hoarsness/ harsh upper airway cough triggered by URI (RVP negative)  -Currently very difficult to sort out how much of this is low airway asthma exacerbation versus upper airway issues.  Clinically right now on 06/22/2021 it appears upper airway.  This very difficult to treat and manage  -Appears upper airway wheezing is what is keeping her inside the hospital  Plan - Continue  lower airway asthma treatment as before - Recommend ENT consult -to rule out any vocal cord pathology -at least to ensure a safe discharge       SIGNATURE    Dr. Brand Males, M.D., F.C.C.P,  Pulmonary and Critical Care Medicine Staff Physician, [place] Director - Interstitial Lung Disease  Program  Pulmonary [facility] [place], [place], [address]  NPI Number:  NPI #[id number] [place] Number: [id number]  Pager: [phone], If no answer  -> Check AMION or  Try [phone] Telephone (clinical office): [phone] Telephone (research): [phone]  5:25 PM 06/22/2021    LABS    PULMONARY No results for input(s): PHART, PCO2ART, PO2ART, HCO3, TCO2, O2SAT in the last 168 hours.  Invalid input(s): PCO2, PO2  CBC Recent Labs  Lab 06/20/21 0513 06/21/21 0407 06/22/21 0621  HGB 11.3* 10.5* 11.0*  HCT 34.9* 32.8* 34.8*  WBC 21.2* 19.7* 20.5*  PLT 334 319 321    COAGULATION No results for input(s): INR in the last 168 hours.  CARDIAC  No results for input(s): TROPONINI in the last 168 hours. No results for input(s): PROBNP in the last 168 hours.   CHEMISTRY Recent Labs  Lab 06/19/21 0440 06/20/21 0513 06/21/21 0407 06/22/21 0621  NA 137 132* 130* 131*  K 4.2 4.6 4.9 4.8  CL 105 100 100 102  CO2 23 19* 20* 22  GLUCOSE 149* 340* 289* 306*  BUN 19 38* 56* 46*  CREATININE 1.24* 1.62* 1.93* 1.45*  CALCIUM 9.4 9.7 9.4 8.9  MG  --   --  2.5* 2.6*   Estimated Creatinine Clearance: 33.8 mL/min (A) (by C-G formula based on SCr of 1.45 mg/dL (H)).   LIVER Recent Labs  Lab 06/19/21 0440 06/20/21 0513  AST 18 20  ALT 19 20  ALKPHOS 68 69  BILITOT 0.4 0.3  PROT 7.6 7.8  ALBUMIN 4.1 4.4     INFECTIOUS No results for input(s): LATICACIDVEN, PROCALCITON in the last 168 hours.   ENDOCRINE CBG (last 3)  Recent Labs    06/22/21 0747 06/22/21 1136 06/22/21 1636  GLUCAP 332* 292* 420*         IMAGING x48h  - image(s) personally visualized  -   highlighted in bold No results found.

## 2021-06-22 NOTE — Plan of Care (Signed)

## 2021-06-22 NOTE — Assessment & Plan Note (Addendum)
-   Possibly reactive from steroid use.

## 2021-06-22 NOTE — Progress Notes (Signed)
Assumed care of pt from off going nurse @ 0630. Agree with previous assessment. Pt with call bell within reach, NAD.

## 2021-06-22 NOTE — Assessment & Plan Note (Addendum)
-   Sodium 141 today.  Encourage oral intake.  Outpatient follow-up.

## 2021-06-22 NOTE — Assessment & Plan Note (Signed)
-   Outpatient follow-up °

## 2021-06-22 NOTE — Progress Notes (Signed)
°  Progress Note   Patient: [name]:[id number] DOB: 06-Oct-1950 DOA: 06/19/2021     2 DOS: the patient was seen and examined on 06/22/2021   Brief hospital course: 71 yo female with PMH of severe persistent asthma, vocal cord dysfunction, CAD, chronic diastolic CHF, depression/anxiety, HLD, DMII, GERD, HTN, seizure, CKD3a  presented with recurrent SOB. She has had recurrent hospitalizations for similar in January.  She was admitted and started on IV steroids.  Pulmonary was consulted.  Assessment and Plan: * Vocal cord dysfunction- (present on admission) - Patient follows closely with pulmonology outpatient.  She has also had recent ENT evaluation; she is recommended to follow-up outpatient with [facility] for possible injections - In the meantime, per pulmonology, she is recommended to continue on steroids, breathing treatments, and chlorphenirmine scheduled (of note, she prefers going home back on tussionex instead of the chlorpheniramine tablets)  Asthma exacerbation- (present on admission) - Still wheezing significantly.  Continue IV steroids.  Leukocytosis - Possibly reactive from steroid use.  Acute renal failure superimposed on stage 3b chronic kidney disease ([name])- (present on admission) -Baseline creat ~ 1.3 - 1.4 -Creatinine was 1.93 on 06/21/2021.  Treated with IV fluids.  Creatinine 1.45 today.  DC IV fluids.  Repeat a.m. creatinine.  Diabetes mellitus type 2, controlled ([name]) - Continue SSI and CBG monitoring  Essential hypertension- (present on admission) - Continue nebivolol  Anxiety - Continue Xanax  GERD (gastroesophageal reflux disease)- (present on admission) - Continue Protonix  Obesity (BMI 30-39.9) - Outpatient follow-up  Anemia of chronic disease - Possibly from renal failure.  Hemoglobin stable.  Hyponatremia - Sodium 131 today.  Encourage oral intake.  Repeat a.m. labs.   Subjective:  Patient seen and examined at bedside.  Still wheezing a lot and  does not feel ready to go home today.  No overnight fever, nausea, vomiting reported.  Physical Exam: Vitals:   06/22/21 0518 06/22/21 0601 06/22/21 0753 06/22/21 1026  BP: (!) 183/86 (!) 175/84 (!) 185/55 (!) 195/94  Pulse: 76  79 83  Resp: 16     Temp: 97.9 F (36.6 C)     TempSrc: Oral     SpO2: 96%     Weight:      Height:       General: No acute distress, currently on room air ENT/neck: No elevated JVD.  No obvious masses  respiratory: Bilateral decreased breath sounds at bases with some diffuse wheezing CVS: S1-S2 heard, rate controlled Abdominal: Soft, nontender, nondistended, no organomegaly, bowel sounds heard Extremities: No cyanosis, clubbing, edema CNS: Alert, awake and oriented.  No focal neurologic deficit.  Moving extremities. Lymph: No cervical lymphadenopathy Skin: No rashes, lesions, ulcers Psych: Normal mood, affect and judgment Musculoskeletal: No obvious joint deformity/tenderness/swelling   Data Reviewed: I have reviewed patient's investigation during this hospitalization myself.  Today's sodium is 131, BUN of 46, creatinine of 1.45, magnesium of 2.6, WBC of 20.5, hemoglobin of 11.  Blood sugars have been elevated between 250-371.  Family Communication: None at bedside  Disposition: Status is: Inpatient Remains inpatient appropriate because: Of need for IV steroids   Planned Discharge Destination: Home     Time spent: 50 minutes  Author: Aline August, MD 06/22/2021 11:11 AM  For on call review [email or web address].

## 2021-06-22 NOTE — Progress Notes (Signed)
End of shift.  Pt ambulated from her room to the vending machines independently.  Pt still wheezing.  Pt has requested morphine multiple times today for pain from coughing.

## 2021-06-23 LAB — GLUCOSE, CAPILLARY
Glucose-Capillary: 292 mg/dL — ABNORMAL HIGH (ref 70–99)
Glucose-Capillary: 307 mg/dL — ABNORMAL HIGH (ref 70–99)
Glucose-Capillary: 315 mg/dL — ABNORMAL HIGH (ref 70–99)
Glucose-Capillary: 207 mg/dL — ABNORMAL HIGH (ref 70–99)

## 2021-06-23 LAB — CBC WITH DIFFERENTIAL/PLATELET
Abs Immature Granulocytes: 0.3 10*3/uL — ABNORMAL HIGH (ref 0.00–0.07)
Basophils Absolute: 0 10*3/uL (ref 0.0–0.1)
Basophils Relative: 0 %
Eosinophils Absolute: 0 10*3/uL (ref 0.0–0.5)
Eosinophils Relative: 0 %
HCT: 29.7 % — ABNORMAL LOW (ref 36.0–46.0)
Hemoglobin: 9.8 g/dL — ABNORMAL LOW (ref 12.0–15.0)
Immature Granulocytes: 2 %
Lymphocytes Relative: 5 %
Lymphs Abs: 0.6 10*3/uL — ABNORMAL LOW (ref 0.7–4.0)
MCH: 32 pg (ref 26.0–34.0)
MCHC: 33 g/dL (ref 30.0–36.0)
MCV: 97.1 fL (ref 80.0–100.0)
Monocytes Absolute: 0.7 10*3/uL (ref 0.1–1.0)
Monocytes Relative: 5 %
Neutro Abs: 11.3 10*3/uL — ABNORMAL HIGH (ref 1.7–7.7)
Neutrophils Relative %: 88 %
Platelets: 243 10*3/uL (ref 150–400)
RBC: 3.06 MIL/uL — ABNORMAL LOW (ref 3.87–5.11)
RDW: 15 % (ref 11.5–15.5)
WBC: 12.9 10*3/uL — ABNORMAL HIGH (ref 4.0–10.5)
nRBC: 0.3 % — ABNORMAL HIGH (ref 0.0–0.2)

## 2021-06-23 LAB — BASIC METABOLIC PANEL
Anion gap: 7 (ref 5–15)
BUN: 44 mg/dL — ABNORMAL HIGH (ref 8–23)
CO2: 25 mmol/L (ref 22–32)
Calcium: 8.7 mg/dL — ABNORMAL LOW (ref 8.9–10.3)
Chloride: 102 mmol/L (ref 98–111)
Creatinine, Ser: 1.53 mg/dL — ABNORMAL HIGH (ref 0.44–1.00)
GFR, Estimated: 36 mL/min — ABNORMAL LOW (ref >60)
Glucose, Bld: 339 mg/dL — ABNORMAL HIGH (ref 70–99)
Potassium: 4.6 mmol/L (ref 3.5–5.1)
Sodium: 134 mmol/L — ABNORMAL LOW (ref 135–145)

## 2021-06-23 LAB — MAGNESIUM: Magnesium: 2.6 mg/dL — ABNORMAL HIGH (ref 1.7–2.4)

## 2021-06-23 MED ORDER — GUAIFENESIN-DM 100-10 MG/5ML PO SYRP
5.0000 mL | ORAL_SOLUTION | ORAL | Status: DC | PRN
  Administered 2021-06-23: 5 mL via ORAL
  Filled 2021-06-23: qty 10

## 2021-06-23 MED ORDER — SUCRALFATE 1 GM/10ML PO SUSP
1.0000 g | Freq: Three times a day (TID) | ORAL | Status: DC
  Administered 2021-06-23 – 2021-06-24 (×3): 1 g via ORAL
  Filled 2021-06-23 (×4): qty 10

## 2021-06-23 MED ORDER — INSULIN ASPART 100 UNIT/ML IJ SOLN
10.0000 [IU] | Freq: Three times a day (TID) | INTRAMUSCULAR | Status: DC
  Administered 2021-06-23 – 2021-06-24 (×4): 10 [IU] via SUBCUTANEOUS

## 2021-06-23 MED ORDER — ENOXAPARIN SODIUM 40 MG/0.4ML IJ SOSY
40.0000 mg | PREFILLED_SYRINGE | INTRAMUSCULAR | Status: DC
  Administered 2021-06-23: 40 mg via SUBCUTANEOUS
  Filled 2021-06-23: qty 0.4

## 2021-06-23 MED ORDER — INSULIN GLARGINE-YFGN 100 UNIT/ML ~~LOC~~ SOLN
20.0000 [IU] | Freq: Every day | SUBCUTANEOUS | Status: DC
  Administered 2021-06-23: 20 [IU] via SUBCUTANEOUS
  Filled 2021-06-23 (×2): qty 0.2

## 2021-06-23 NOTE — TOC Progression Note (Signed)
Transition of Care [facility]) - Progression Note    Patient Details  Name: [name] MRN: [id number] Date of Birth: [birth year]  [facility] CM/SW Contact  Purcell Mouton, RN Phone Number: 06/23/2021, 3:37 PM  Clinical Narrative:    Spoke with pt there are no home health needs at present time.    Expected Discharge Plan: [place] Barriers to Discharge: No Barriers Identified  Expected Discharge Plan and Services Expected Discharge Plan: [place]   Discharge Planning Services: CM Consult Post Acute Care Choice: Home Health, Durable Medical Equipment Living arrangements for the past 2 months: Single Family Home                                       Social Determinants of Health (SDOH) Interventions    Readmission Risk Interventions No flowsheet data found.

## 2021-06-23 NOTE — Progress Notes (Signed)
Pt continues to refuse CPAP qhs.  Pt encouraged to contact RT should she change her mind. 

## 2021-06-23 NOTE — Progress Notes (Signed)
PCCM progress note  Chart reviewed and it appears ENT scoped patient with no abnormality seen to vocal cords. Recommendations made for reduction of acid reflux as this is likely the biggest contributor to patient's persistent symptoms.  No further pulmonary needs identified, spoke to primary team and they are in agreement.    PCCM will sign off. Thank you for the opportunity to participate in this patient's care. Please contact if we can be of further assistance.  Raeqwon Lux D. Kenton Kingfisher, NP-C [facility] Personal contact information can be found on Amion  06/23/2021, 1:30 PM

## 2021-06-23 NOTE — Consult Note (Signed)
Reason for Consult: Wheezing and airway distress Referring Physician: Aline August, MD  [name] is an 71 y.o. female.  HPI: Patient was seen a couple of years ago in the office with severe reflux induced laryngitis.  She had made some dietary changes and had improved significantly.  Currently she is drinking 1 cup of coffee every morning and eating chocolate with some consistency.  No other dietary risk factors.  She has been in and out of the hospital multiple times in the last few months due to respiratory difficulty sometimes associated with asthma and sometimes with COVID.  She denies difficulty swallowing.  Past Medical History:  Diagnosis Date   Anxiety    Arthritis    Asthma    Chronic diastolic CHF (congestive heart failure) ([place]) 07/17/2016   Coronary artery disease    a. NSTEMI 12/2015 - LHC 01/18/16: s/p overlapping DESx2 to RCA, 10% ost-prox Cx,10% mLAD.   Dementia ([employer])    Depression    Diabetes mellitus    Dyslipidemia    GERD (gastroesophageal reflux disease)    History of seizure    Hypertension    Hypertensive heart disease    NSTEMI (non-ST elevated myocardial infarction) ([place]) 12/2015   Stage 3 chronic kidney disease (HCC)     Past Surgical History:  Procedure Laterality Date   ABDOMINAL HYSTERECTOMY     CARDIAC CATHETERIZATION  1994   minimal LAD dz, no other dz, EF normal   CARDIAC CATHETERIZATION N/A 01/18/2016   Procedure: Left Heart Cath and Coronary Angiography;  Surgeon: Burnell Blanks, MD;  Location: [place] [facility];  Service: Cardiovascular;  Laterality: N/A;   CARDIAC CATHETERIZATION N/A 01/18/2016   Procedure: Coronary Stent Intervention;  Surgeon: Burnell Blanks, MD;  Location: [place] [facility];  Service: Cardiovascular;  Laterality: N/A;   COLONOSCOPY WITH PROPOFOL N/A 04/09/2014   Procedure: COLONOSCOPY WITH PROPOFOL;  Surgeon: Cleotis Nipper, MD;  Location: [facility];  Service: Endoscopy;  Laterality: N/A;    CORONARY STENT PLACEMENT  01/19/2016   STENT SYNERGY DES [id number] drug eluting stent was successfully placed, and overlaps the 2.5 x 38 mm Synergy stent placed distally.   ESOPHAGOGASTRODUODENOSCOPY (EGD) WITH PROPOFOL N/A 04/09/2014   Procedure: ESOPHAGOGASTRODUODENOSCOPY (EGD) WITH PROPOFOL;  Surgeon: Cleotis Nipper, MD;  Location: [facility];  Service: Endoscopy;  Laterality: N/A;   FOOT SURGERY Bilateral    approx ~2000   HEMORRHOID SURGERY     HERNIA REPAIR     KNEE ARTHROSCOPY     RADIOACTIVE SEED GUIDED EXCISIONAL BREAST BIOPSY Left 01/20/2021   Procedure: RADIOACTIVE SEED GUIDED EXCISIONAL LEFT BREAST BIOPSY;  Surgeon: Stark Klein, MD;  Location: [place];  Service: General;  Laterality: Left;    Family History  Problem Relation Age of Onset   Allergies Mother    Asthma Mother    CAD Father 18   Diabetes Father    Hypertension Father    Congestive Heart Failure Sister        died in her 34s   CAD Sister 36    Social History:  reports that she quit smoking about 17 years ago. Her smoking use included cigarettes. She has a 30.00 pack-year smoking history. She has never used smokeless tobacco. She reports that she does not drink alcohol and does not use drugs.  Allergies:  Allergies  Allergen Reactions   Chlorthalidone Other (See Comments)    Was stopped due to kidney function   Tramadol Other (See Comments)  Not to be taken because of existing seizure disorder/SEIZURES    Medications: Reviewed  Results for orders placed or performed during the hospital encounter of 06/19/21 (from the past 48 hour(s))  Glucose, capillary     Status: Abnormal   Collection Time: 06/21/21 11:38 AM  Result Value Ref Range   Glucose-Capillary 371 (H) 70 - 99 mg/dL    Comment: Glucose reference range applies only to samples taken after fasting for at least 8 hours.  Glucose, capillary     Status: Abnormal   Collection Time: 06/21/21  4:25 PM  Result Value Ref Range   Glucose-Capillary  312 (H) 70 - 99 mg/dL    Comment: Glucose reference range applies only to samples taken after fasting for at least 8 hours.  Glucose, capillary     Status: Abnormal   Collection Time: 06/21/21  8:36 PM  Result Value Ref Range   Glucose-Capillary 250 (H) 70 - 99 mg/dL    Comment: Glucose reference range applies only to samples taken after fasting for at least 8 hours.  Basic metabolic panel     Status: Abnormal   Collection Time: 06/22/21  6:21 AM  Result Value Ref Range   Sodium 131 (L) 135 - 145 mmol/L   Potassium 4.8 3.5 - 5.1 mmol/L   Chloride 102 98 - 111 mmol/L   CO2 22 22 - 32 mmol/L   Glucose, Bld 306 (H) 70 - 99 mg/dL    Comment: Glucose reference range applies only to samples taken after fasting for at least 8 hours.   BUN 46 (H) 8 - 23 mg/dL   Creatinine, Ser 1.45 (H) 0.44 - 1.00 mg/dL   Calcium 8.9 8.9 - 10.3 mg/dL   GFR, Estimated 38 (L) >60 mL/min    Comment: (NOTE) Calculated using the CKD-EPI Creatinine Equation (2021)    Anion gap 7 5 - 15    Comment: Performed at [facility], [place] [address] [place], [place] [address]  CBC with Differential/Platelet     Status: Abnormal   Collection Time: 06/22/21  6:21 AM  Result Value Ref Range   WBC 20.5 (H) 4.0 - 10.5 K/uL   RBC 3.53 (L) 3.87 - 5.11 MIL/uL   Hemoglobin 11.0 (L) 12.0 - 15.0 g/dL   HCT 34.8 (L) 36.0 - 46.0 %   MCV 98.6 80.0 - 100.0 fL   MCH 31.2 26.0 - 34.0 pg   MCHC 31.6 30.0 - 36.0 g/dL   RDW 15.2 11.5 - 15.5 %   Platelets 321 150 - 400 K/uL   nRBC 0.0 0.0 - 0.2 %   Neutrophils Relative % 88 %   Neutro Abs 18.2 (H) 1.7 - 7.7 K/uL   Lymphocytes Relative 4 %   Lymphs Abs 0.7 0.7 - 4.0 K/uL   Monocytes Relative 6 %   Monocytes Absolute 1.2 (H) 0.1 - 1.0 K/uL   Eosinophils Relative 0 %   Eosinophils Absolute 0.0 0.0 - 0.5 K/uL   Basophils Relative 0 %   Basophils Absolute 0.0 0.0 - 0.1 K/uL   Immature Granulocytes 2 %   Abs Immature Granulocytes 0.30 (H) 0.00 - 0.07 K/uL     Comment: Performed at [facility] [place] [address], [place], [place] [address]  Magnesium     Status: Abnormal   Collection Time: 06/22/21  6:21 AM  Result Value Ref Range   Magnesium 2.6 (H) 1.7 - 2.4 mg/dL    Comment: Performed at [facility],  [place] [address], [place], [place] [address]  Glucose, capillary     Status: Abnormal   Collection Time: 06/22/21  7:47 AM  Result Value Ref Range   Glucose-Capillary 332 (H) 70 - 99 mg/dL    Comment: Glucose reference range applies only to samples taken after fasting for at least 8 hours.  Glucose, capillary     Status: Abnormal   Collection Time: 06/22/21 11:36 AM  Result Value Ref Range   Glucose-Capillary 292 (H) 70 - 99 mg/dL    Comment: Glucose reference range applies only to samples taken after fasting for at least 8 hours.  Glucose, capillary     Status: Abnormal   Collection Time: 06/22/21  4:36 PM  Result Value Ref Range   Glucose-Capillary 420 (H) 70 - 99 mg/dL    Comment: Glucose reference range applies only to samples taken after fasting for at least 8 hours.  Glucose, capillary     Status: Abnormal   Collection Time: 06/22/21  8:47 PM  Result Value Ref Range   Glucose-Capillary 364 (H) 70 - 99 mg/dL    Comment: Glucose reference range applies only to samples taken after fasting for at least 8 hours.  Basic metabolic panel     Status: Abnormal   Collection Time: 06/23/21  4:08 AM  Result Value Ref Range   Sodium 134 (L) 135 - 145 mmol/L   Potassium 4.6 3.5 - 5.1 mmol/L   Chloride 102 98 - 111 mmol/L   CO2 25 22 - 32 mmol/L   Glucose, Bld 339 (H) 70 - 99 mg/dL    Comment: Glucose reference range applies only to samples taken after fasting for at least 8 hours.   BUN 44 (H) 8 - 23 mg/dL   Creatinine, Ser 1.53 (H) 0.44 - 1.00 mg/dL   Calcium 8.7 (L) 8.9 - 10.3 mg/dL   GFR, Estimated 36 (L) >60 mL/min    Comment: (NOTE) Calculated using the CKD-EPI Creatinine Equation (2021)    Anion gap  7 5 - 15    Comment: Performed at [facility], [place] [address] [place], [place] [address]  CBC with Differential/Platelet     Status: Abnormal   Collection Time: 06/23/21  4:08 AM  Result Value Ref Range   WBC 12.9 (H) 4.0 - 10.5 K/uL   RBC 3.06 (L) 3.87 - 5.11 MIL/uL   Hemoglobin 9.8 (L) 12.0 - 15.0 g/dL   HCT 29.7 (L) 36.0 - 46.0 %   MCV 97.1 80.0 - 100.0 fL   MCH 32.0 26.0 - 34.0 pg   MCHC 33.0 30.0 - 36.0 g/dL   RDW 15.0 11.5 - 15.5 %   Platelets 243 150 - 400 K/uL   nRBC 0.3 (H) 0.0 - 0.2 %   Neutrophils Relative % 88 %   Neutro Abs 11.3 (H) 1.7 - 7.7 K/uL   Lymphocytes Relative 5 %   Lymphs Abs 0.6 (L) 0.7 - 4.0 K/uL   Monocytes Relative 5 %   Monocytes Absolute 0.7 0.1 - 1.0 K/uL   Eosinophils Relative 0 %   Eosinophils Absolute 0.0 0.0 - 0.5 K/uL   Basophils Relative 0 %   Basophils Absolute 0.0 0.0 - 0.1 K/uL   Immature Granulocytes 2 %   Abs Immature Granulocytes 0.30 (H) 0.00 - 0.07 K/uL    Comment: Performed at [facility], [place] [address], [place],  [address]  Magnesium     Status: Abnormal   Collection Time: 06/23/21  4:08 AM  Result Value Ref Range   Magnesium 2.6 (H) 1.7 - 2.4 mg/dL    Comment: Performed at [facility], [place] [address], [place],  [address]  Glucose, capillary     Status: Abnormal   Collection Time: 06/23/21  7:44 AM  Result Value Ref Range   Glucose-Capillary 292 (H) 70 - 99 mg/dL    Comment: Glucose reference range applies only to samples taken after fasting for at least 8 hours.    No results found.  ZDG:LOVFIEPP except as listed in admit H&P  Blood pressure (!) 207/88, pulse 74, temperature 98.6 F (37 C), temperature source Oral, resp. rate 18, height 5\' 2"  (1.575 m), weight 77.6 kg, SpO2 100 %.  PHYSICAL EXAM: Overall appearance: She is mildly tachypneic and has audible wheezing with expiration.  Her voice is strained but she is displaying any signs of  stridor. Head:  Normocephalic, atraumatic. Ears: External ears look healthy. Nose: External nose is healthy in appearance. Internal nasal exam free of any lesions or obstruction. Oral Cavity/Pharynx:  There are no mucosal lesions or masses identified. Larynx/Hypopharynx: Deferred Neuro:  No identifiable neurologic deficits. Neck: No palpable neck masses.  Studies Reviewed: CT neck was negative on February 8.  Procedures: Flexible fiberoptic laryngoscopy was performed following topical application of Afrin/lidocaine to the nasal cavities.  The hypopharynx is clear.  The larynx reveals normal vocal cord mobility, with appropriate abduction and abduction.  There is no evidence of obstruction or mucosal mass.  There is severe arytenoid and postcricoid mucosal edema.   Assessment/Plan: Asthma.  Chronic reflux.  It is going to be very important for her to eliminate all caffeine.  Reflux is exacerbating her asthma.  No other airway intervention needed at this time.  J45.30 K21.9   Medical Decision Making: #/Complex Problems: 4  Data Reviewed:4  Management:3 (1-Straightforward, 2-Low, 3-Moderate, 4-High)   Izora Gala 06/23/2021, 8:33 AM

## 2021-06-23 NOTE — Progress Notes (Signed)
Progress Note   Patient: [name] YWV:[id number] DOB: January 24, 1951 DOA: 06/19/2021     3 DOS: the patient was seen and examined on 06/23/2021   Brief hospital course: 71 yo female with PMH of severe persistent asthma, vocal cord dysfunction, CAD, chronic diastolic CHF, depression/anxiety, HLD, DMII, GERD, HTN, seizure, CKD3a  presented with recurrent SOB. She has had recurrent hospitalizations for similar in January.  She was admitted and started on IV steroids.  Pulmonary was consulted.  Assessment and Plan: * Vocal cord dysfunction- (present on admission) - Patient follows closely with pulmonology outpatient.  She has also had recent ENT evaluation; she is recommended to follow-up outpatient with [facility] for possible injections - In the meantime, per pulmonology, she is recommended to continue on steroids, breathing treatments, and chlorphenirmine scheduled (of note, she prefers going home back on tussionex instead of the chlorpheniramine tablets).  Pulmonary evaluated the patient on 06/22/2021 and recommended ENT consult.  Will consult ENT today.  Follow recommendations.  Asthma exacerbation- (present on admission) - Still wheezing significantly.  Continue IV steroids.  Leukocytosis - Possibly reactive from steroid use.  Improving.  Acute renal failure superimposed on stage 3b chronic kidney disease ([place])- (present on admission) -Baseline creat ~ 1.3 - 1.4 -Creatinine was 1.93 on 06/21/2021.  Treated with IV fluids.  Creatinine 1.53 today.  DC'd IV fluids.  Repeat a.m. creatinine.  Diabetes mellitus type 2, controlled ([place]) Hyperglycemia -Blood sugar still elevated.  Increase long-acting and short acting insulin dose and continue SSI and CBG monitoring  Essential hypertension- (present on admission) - Continue nebivolol  Anxiety - Continue Xanax  GERD (gastroesophageal reflux disease)- (present on admission) - Continue Protonix  Obesity (BMI 30-39.9) - Outpatient  follow-up  Anemia of chronic disease - Possibly from renal failure.  Hemoglobin stable.  Hyponatremia - Sodium 134 today.  Encourage oral intake.  Repeat a.m. labs.       Subjective:  Patient seen and examined at bedside.  Doesn't feel well and still complains of severe wheezing.  No chest pain, fever, vomiting reported.  Physical Exam: Vitals:   06/22/21 2050 06/22/21 2114 06/22/21 2200 06/23/21 0527  BP: (!) 176/79   (!) 175/82  Pulse: 84   76  Resp: 18 16 15 16   Temp: 97.9 F (36.6 C)   98.6 F (37 C)  TempSrc: Oral   Oral  SpO2: 98%   100%  Weight:      Height:        General: On room air currently.  No distress. ENT/neck: No palpable thyromegaly.  JVD is not elevated.  Upper airway wheezing heard Respiratory: Decreased breath sounds at bases bilaterally with scattered crackles  CVS: Currently rate controlled; S1-S2 heard  abdominal: Soft, nontender, distended slightly; no organomegaly, normal bowel sounds are heard  extremities: Mild lower extremity edema present; no clubbing CNS: Awake and alert.  No focal neurologic deficit.  Moves extremities. Lymph: No palpable lymphadenopathy Skin: No obvious ecchymosis/lesions  psych: Affect, mood and judgment are normal  musculoskeletal: No obvious joint swelling/deformity  Data Reviewed: I have reviewed patient's investigations myself including today's sodium of 134, creatinine of 1.53, WBC of 12.9, hemoglobin of 9.8.  Blood sugars going as high as 420 over the last 24 hours.  Family Communication: None at bedside  Disposition: Status is: Inpatient Remains inpatient appropriate because: Of severity of illness    Planned Discharge Destination: Home     Time spent: 50 minutes  Author: Aline August, MD 06/23/2021 8:00 AM  For on call review [email or web address].

## 2021-06-24 LAB — CBC WITH DIFFERENTIAL/PLATELET
Abs Immature Granulocytes: 0.31 10*3/uL — ABNORMAL HIGH (ref 0.00–0.07)
Basophils Absolute: 0 10*3/uL (ref 0.0–0.1)
Basophils Relative: 0 %
Eosinophils Absolute: 0 10*3/uL (ref 0.0–0.5)
Eosinophils Relative: 0 %
HCT: 32.3 % — ABNORMAL LOW (ref 36.0–46.0)
Hemoglobin: 10.4 g/dL — ABNORMAL LOW (ref 12.0–15.0)
Immature Granulocytes: 2 %
Lymphocytes Relative: 6 %
Lymphs Abs: 0.8 10*3/uL (ref 0.7–4.0)
MCH: 31.4 pg (ref 26.0–34.0)
MCHC: 32.2 g/dL (ref 30.0–36.0)
MCV: 97.6 fL (ref 80.0–100.0)
Monocytes Absolute: 0.6 10*3/uL (ref 0.1–1.0)
Monocytes Relative: 4 %
Neutro Abs: 11.3 10*3/uL — ABNORMAL HIGH (ref 1.7–7.7)
Neutrophils Relative %: 88 %
Platelets: 238 10*3/uL (ref 150–400)
RBC: 3.31 MIL/uL — ABNORMAL LOW (ref 3.87–5.11)
RDW: 14.8 % (ref 11.5–15.5)
WBC: 13 10*3/uL — ABNORMAL HIGH (ref 4.0–10.5)
nRBC: 0.4 % — ABNORMAL HIGH (ref 0.0–0.2)

## 2021-06-24 LAB — BASIC METABOLIC PANEL
Anion gap: 9 (ref 5–15)
BUN: 41 mg/dL — ABNORMAL HIGH (ref 8–23)
CO2: 25 mmol/L (ref 22–32)
Calcium: 9 mg/dL (ref 8.9–10.3)
Chloride: 100 mmol/L (ref 98–111)
Creatinine, Ser: 1.38 mg/dL — ABNORMAL HIGH (ref 0.44–1.00)
GFR, Estimated: 41 mL/min — ABNORMAL LOW (ref >60)
Glucose, Bld: 280 mg/dL — ABNORMAL HIGH (ref 70–99)
Potassium: 4.3 mmol/L (ref 3.5–5.1)
Sodium: 134 mmol/L — ABNORMAL LOW (ref 135–145)

## 2021-06-24 LAB — GLUCOSE, CAPILLARY: Glucose-Capillary: 284 mg/dL — ABNORMAL HIGH (ref 70–99)

## 2021-06-24 LAB — MAGNESIUM: Magnesium: 2.6 mg/dL — ABNORMAL HIGH (ref 1.7–2.4)

## 2021-06-24 MED ORDER — ORAL CARE MOUTH RINSE: 15.0000 mL | Freq: Two times a day (BID) | OROMUCOSAL | Status: DC

## 2021-06-24 MED ORDER — NITROGLYCERIN 0.4 MG SL SUBL: 0.4000 mg | SUBLINGUAL_TABLET | SUBLINGUAL | 0 refills | Status: AC | PRN

## 2021-06-24 MED ORDER — PANTOPRAZOLE SODIUM 40 MG PO TBEC: 40.0000 mg | DELAYED_RELEASE_TABLET | Freq: Two times a day (BID) | ORAL | 0 refills | Status: AC

## 2021-06-24 MED ORDER — ALUM & MAG HYDROXIDE-SIMETH 200-200-20 MG/5ML PO SUSP: 30.0000 mL | ORAL | 1 refills | Status: DC | PRN

## 2021-06-24 MED ORDER — MOMETASONE FURO-FORMOTEROL FUM 100-5 MCG/ACT IN AERO
2.0000 | INHALATION_SPRAY | Freq: Two times a day (BID) | RESPIRATORY_TRACT | 0 refills | Status: DC
Start: 2021-06-24 — End: 2021-08-24

## 2021-06-24 MED ORDER — CHLORHEXIDINE GLUCONATE 0.12 % MT SOLN: 15.0000 mL | Freq: Two times a day (BID) | OROMUCOSAL | Status: DC

## 2021-06-24 MED ORDER — GUAIFENESIN-DM 100-10 MG/5ML PO SYRP: 10.0000 mL | ORAL_SOLUTION | ORAL | 1 refills | Status: DC | PRN

## 2021-06-24 MED ORDER — OXYCODONE-ACETAMINOPHEN 5-325 MG PO TABS: 1.0000 | ORAL_TABLET | Freq: Four times a day (QID) | ORAL | 0 refills | Status: DC | PRN

## 2021-06-24 MED ORDER — SUCRALFATE 1 GM/10ML PO SUSP: 1.0000 g | Freq: Three times a day (TID) | ORAL | 0 refills | Status: DC

## 2021-06-24 MED ORDER — PREDNISONE 20 MG PO TABS: 40.0000 mg | ORAL_TABLET | Freq: Every day | ORAL | 0 refills | Status: AC

## 2021-06-24 MED ORDER — CHLORPHENIRAMINE MALEATE 4 MG PO TABS: 4.0000 mg | ORAL_TABLET | ORAL | 0 refills | Status: DC

## 2021-06-24 NOTE — Discharge Summary (Signed)
Physician Discharge Summary   Patient: [name] MRN: [id number] DOB: Dec 27, 1950  Admit date:     06/19/2021  Discharge date: 06/24/21  Discharge Physician: Aline August   PCP: [name], MD   Recommendations at discharge:   Follow-up with PCP in a week Outpatient follow-up with ENT and pulmonary. Follow-up in the ED if symptoms worsen or new appear   Hospital Course: 71 yo female with PMH of severe persistent asthma, vocal cord dysfunction, CAD, chronic diastolic CHF, depression/anxiety, HLD, DMII, GERD, HTN, seizure, CKD3a  presented with recurrent SOB. She has had recurrent hospitalizations for similar in January.  She was admitted and started on IV steroids.  Pulmonary was consulted.  Subsequently, ENT was consulted: Flexible fiberoptic laryngoscopy was unremarkable.  ENT recommended no airway intervention needed and recommended treatment for chronic reflux.  She is still wheezing some but feels better.  Pulmonary has signed off.  She will be discharged home today on oral prednisone, Protonix, Carafate, antihistamines and as needed Maalox.  Outpatient follow-up with pulmonary/ENT and PCP.  Discharge diagnosis and assessment and Plan: Vocal cord dysfunction- (present on admission) - Patient follows closely with pulmonology outpatient.  She has also had recent ENT evaluation; she is recommended to follow-up outpatient with [facility] for possible injections -Treated with IV steroids along with breathing treatments and chlorpheniramine tablets. Subsequently, ENT was consulted: Flexible fiberoptic laryngoscopy was unremarkable.  ENT recommended no airway intervention needed and recommended treatment for chronic reflux.  -Outpatient follow-up with ENT.  Discharge patient home today.  Asthma exacerbation- (present on admission) - Pulmonary signed off on 06/23/2021.  Wheezing most likely from upper airway as per pulmonary. -Treated with IV steroids and nebs.  Switch to oral prednisone 40  mg daily for 7 days.  Continue nebs.  Continue inhaled steroid.  Outpatient follow-up with pulmonary.  Continue Singulair  Leukocytosis - Possibly reactive from steroid use.  Acute renal failure superimposed on stage 3b chronic kidney disease ([place])- (present on admission) -Baseline creat ~ 1.3 - 1.4 -Creatinine was 1.93 on 06/21/2021.  Treated with IV fluids.  Creatinine 1.38 today.  Of IV fluids.  Outpatient follow-up.  Diabetes mellitus type 2, controlled ([place]) Hyperglycemia -Blood sugars still elevated.  Carb modified diet.  Resume home regimen.  Outpatient follow-up.  Essential hypertension- (present on admission) - Continue nebivolol, Cardizem and hydralazine.  Blood pressures still elevated.  Might need adjustment in regimen as an outpatient by PCP.  Anxiety - Continue Xanax  GERD (gastroesophageal reflux disease)- (present on admission) - Continue Protonix  Obesity (BMI 30-39.9) - Outpatient follow-up  Anemia of chronic disease - Possibly from renal failure.  Hemoglobin stable.  Hyponatremia - Sodium 134 today.  Encourage oral intake.  Outpatient follow-up.        Consultants: ENT/pulmonary Procedures performed: Fiberoptic flexible laryngoscopy on 06/23/2021 by ENT Disposition: Home Diet recommendation:  Discharge Diet Orders (From admission, onward)     Start     Ordered   06/24/21 0000  Diet - low sodium heart healthy        06/24/21 0901           Cardiac and Carb modified diet  DISCHARGE MEDICATION: Allergies as of 06/24/2021       Reactions   Chlorthalidone Other (See Comments)   Was stopped due to kidney function   Tramadol Other (See Comments)   Not to be taken because of existing seizure disorder/SEIZURES        Medication List     STOP  taking these medications    cyclobenzaprine 10 MG tablet Commonly known as: FLEXERIL   famotidine 20 MG tablet Commonly known as: PEPCID   potassium chloride SA 20 MEQ tablet Commonly known as:  KLOR-CON M       TAKE these medications    albuterol 108 (90 Base) MCG/ACT inhaler Commonly known as: ProAir HFA Inhale 2 puffs into the lungs every 4 (four) hours as needed for wheezing or shortness of breath.   albuterol (2.5 MG/3ML) 0.083% nebulizer solution Commonly known as: PROVENTIL Take 3 mLs (2.5 mg total) by nebulization every 4 (four) hours as needed for wheezing or shortness of breath.   ALPRAZolam 0.5 MG tablet Commonly known as: XANAX Take 1 tablet (0.5 mg total) by mouth 2 (two) times daily as needed for anxiety. What changed:  when to take this additional instructions   alum & mag hydroxide-simeth 200-200-20 MG/5ML suspension Commonly known as: MAALOX/MYLANTA Take 30 mLs by mouth every 4 (four) hours as needed for indigestion.   atorvastatin 40 MG tablet Commonly known as: LIPITOR Take 40 mg by mouth at bedtime.   Biotin 5000 MCG Caps Take 5,000 mcg by mouth daily.   budesonide 0.5 MG/2ML nebulizer solution Commonly known as: PULMICORT USE 1 VIAL IN NEBULIZER IN THE MORNING AND AT BEDTIME What changed: See the new instructions.   chlorpheniramine 4 MG tablet Commonly known as: CHLOR-TRIMETON Take 1 tablet (4 mg total) by mouth every 4 (four) hours.   diltiazem 240 MG 24 hr capsule Commonly known as: CARDIZEM CD Take 1 capsule (240 mg total) by mouth daily.   divalproex 500 MG 24 hr tablet Commonly known as: Depakote ER Take 1 tablet (500 mg total) by mouth at bedtime.   EQ Probiotic Caps Take 1 capsule by mouth every morning.   fluticasone 50 MCG/ACT nasal spray Commonly known as: FLONASE Place 2 sprays into both nostrils daily as needed for allergies.   formoterol 20 MCG/2ML nebulizer solution Commonly known as: Perforomist USE 1 VIAL  IN  NEBULIZER TWICE  DAILY - morning and evening What changed:  how much to take when to take this additional instructions   glipiZIDE 5 MG tablet Commonly known as: GLUCOTROL Take 5 mg by mouth daily  before breakfast.   guaiFENesin-dextromethorphan 100-10 MG/5ML syrup Commonly known as: ROBITUSSIN DM Take 10 mLs by mouth every 4 (four) hours as needed for cough.   hydrALAZINE 100 MG tablet Commonly known as: APRESOLINE Take 1 tablet (100 mg total) by mouth 3 (three) times daily. Please make yearly appt with Dr. Marlou Porch for April 2023 for future refills. Thank you 1st attempt What changed: additional instructions   latanoprost 0.005 % ophthalmic solution Commonly known as: XALATAN Place 1 drop into both eyes at bedtime.   magnesium oxide 400 (240 Mg) MG tablet Commonly known as: MAG-OX Take 1 tablet (400 mg total) by mouth daily.   memantine 10 MG tablet Commonly known as: NAMENDA Take 1 tablet (10 mg total) by mouth 2 (two) times daily. What changed: when to take this   metFORMIN 1000 MG tablet Commonly known as: GLUCOPHAGE Take 1,000 mg by mouth 2 (two) times daily with a meal.   mometasone-formoterol 100-5 MCG/ACT Aero Commonly known as: DULERA Inhale 2 puffs into the lungs 2 (two) times daily.   montelukast 10 MG tablet Commonly known as: SINGULAIR Take 1 tablet (10 mg total) by mouth at bedtime.   multivitamin with minerals Tabs tablet Take 1 tablet by mouth daily with breakfast.  nebivolol 10 MG tablet Commonly known as: BYSTOLIC Take 1 tablet (10 mg total) by mouth daily.   nitroGLYCERIN 0.4 MG SL tablet Commonly known as: NITROSTAT Place 1 tablet (0.4 mg total) under the tongue every 5 (five) minutes as needed for chest pain. Don't exceed more than 3 doses in 15 mins What changed: See the new instructions.   oxyCODONE-acetaminophen 5-325 MG tablet Commonly known as: PERCOCET/ROXICET Take 1 tablet by mouth every 6 (six) hours as needed (for pain).   pantoprazole 40 MG tablet Commonly known as: PROTONIX Take 1 tablet (40 mg total) by mouth 2 (two) times daily before a meal.   predniSONE 20 MG tablet Commonly known as: DELTASONE Take 2 tablets (40 mg  total) by mouth daily with breakfast for 7 days.   sucralfate 1 GM/10ML suspension Commonly known as: CARAFATE Take 10 mLs (1 g total) by mouth 4 (four) times daily -  with meals and at bedtime.        Follow-up Information     [name], MD. Schedule an appointment as soon as possible for a visit in 1 week(s).   Specialty: Family Medicine Contact information: [place] [address] [phone]         Jerline Pain, MD .   Specialty: Cardiology Contact information: [phone] [address] [place] [address] [phone]         Izora Gala, MD. Schedule an appointment as soon as possible for a visit in 1 week(s).   Specialty: Otolaryngology Contact information: [address] [place] [address] [phone]               Subjective: Patient seen and examined at bedside.  Feels better and feels okay to go home today although still has some intermittent wheezing.  No overnight fever, chest pain, worsening shortness of breath reported  Discharge Exam: Filed Weights   06/19/21 1834  Weight: 77.6 kg   General: No acute distress, currently on room air ENT/neck: No elevated JVD.  No obvious masses.  Upper airway noises/wheezing still heard but improving respiratory: Bilateral decreased breath sounds at bases with some scattered crackles and some mild wheezing CVS: S1-S2 heard, rate controlled Abdominal: Soft, obese, nontender, nondistended, no organomegaly, bowel sounds heard Extremities: No cyanosis, clubbing; trace lower extremity edema present  Condition at discharge: fair  The results of significant diagnostics from this hospitalization (including imaging, microbiology, ancillary and laboratory) are listed below for reference.   Imaging Studies: DG Chest 2 View  Result Date: 06/05/2021 CLINICAL DATA:  Shortness of breath EXAM: CHEST - 2 VIEW COMPARISON:  Chest radiograph dated May 13, 2021 FINDINGS: The heart size and mediastinal contours are within normal limits. Both lungs are clear. The visualized skeletal structures are unremarkable. IMPRESSION: No active cardiopulmonary disease. Electronically Signed   By: Keane Police D.O.   On: 06/05/2021 18:16   CT SOFT TISSUE NECK W CONTRAST  Result Date: 06/08/2021 CLINICAL DATA:  Epiglottitis or tonsillitis suspected. Tightness for 2 months. Cough for 2 months EXAM: CT NECK WITH CONTRAST TECHNIQUE: Multidetector CT imaging of the neck was performed using the standard protocol following the bolus administration of intravenous contrast. RADIATION DOSE REDUCTION: This exam was performed according to the departmental dose-optimization program which includes automated exposure control, adjustment of the mA and/or kV according to patient size and/or use of iterative reconstruction technique. CONTRAST:  63mL OMNIPAQUE IOHEXOL 300 MG/ML  SOLN COMPARISON:  None. FINDINGS:  Pharynx and larynx: Wasting of the pharynx due to medialization of the carotids. No mucosal based mass or swelling noted. Salivary glands: No inflammation, mass, or stone. Thyroid: Normal. Lymph nodes: None enlarged or abnormal density. Vascular: Carotid atherosclerosis and tortuosity. Limited intracranial: Negative Visualized orbits: Negative Mastoids and visualized paranasal sinuses: Clear Skeleton: Multilevel cervical disc space narrowing and ridging. Upper chest: Clear apical lungs. IMPRESSION: No evidence of mass or inflammation. Electronically Signed   By: Jorje Guild M.D.   On: 06/08/2021 19:16   CT Chest Wo Contrast  Result Date: 06/19/2021 CLINICAL DATA:  71 year old female with respiratory illness. Negative portable chest this morning. EXAM: CT CHEST WITHOUT CONTRAST TECHNIQUE: Multidetector CT imaging of the chest was performed following the standard protocol without IV contrast. RADIATION DOSE REDUCTION: This exam was performed according to the departmental  dose-optimization program which includes automated exposure control, adjustment of the mA and/or kV according to patient size and/or use of iterative reconstruction technique. COMPARISON:  High-resolution chest CT 06/08/2021. Neck CT 06/08/2021. FINDINGS: Cardiovascular: Calcified coronary artery atherosclerosis and/or stents again noted. Stable cardiac size at the upper limits of normal. No pericardial effusion. Calcified aortic atherosclerosis. Mediastinum/Nodes: Negative. No mediastinal mass or lymphadenopathy. Lungs/Pleura: Major airways are patent. Tracheal contour during this phase of the respiratory cycle is normal (see report 06/08/2021 regarding excessive dynamic airway collapse). Improved lung volumes today and both lungs are clear aside from minimal left lower lobe linear scarring or atelectasis, and 4 mm subpleural right upper lobe lung nodule on series 7, image 56 which is stable since at least 2018 and therefore benign. No pleural effusion. Upper Abdomen: Negative visible noncontrast liver, gallbladder, spleen, pancreas, adrenal glands and bowel in the upper abdomen. Mild left nephrolithiasis. Negative visible right kidney. Musculoskeletal: Lower thoracic disc and endplate degeneration. Small T6 benign bone island was present in 2018. No acute osseous abnormality identified. IMPRESSION: 1. No acute finding in the Chest. Normal patency of the major airways today, see HR Chest CT 06/08/2021 regarding findings of excessive dynamic airway collapse. 2. Calcified coronary artery and Aortic Atherosclerosis (ICD10-I70.0). 3. Left nephrolithiasis. Electronically Signed   By: Genevie Ann M.D.   On: 06/19/2021 07:29   CT Chest High Resolution  Result Date: 06/08/2021 CLINICAL DATA:  Evaluate for tracheobronchial malacia. EXAM: CT CHEST WITHOUT CONTRAST TECHNIQUE: Multidetector CT imaging of the chest was performed following the standard protocol without intravenous contrast. High resolution imaging of the lungs,  as well as inspiratory and expiratory imaging, was performed. RADIATION DOSE REDUCTION: This exam was performed according to the departmental dose-optimization program which includes automated exposure control, adjustment of the mA and/or kV according to patient size and/or use of iterative reconstruction technique. COMPARISON:  Multiple priors, most recent dated August 03, 2016 FINDINGS: Cardiovascular: No pericardial effusion. Left main and three-vessel coronary artery calcifications. Mild atherosclerotic disease of the thoracic aorta. Mediastinum/Nodes: Esophagus and thyroid are unremarkable. No pathologically enlarged lymph nodes seen in the chest. Lungs/Pleura: Central airways are patent. Mild bilateral air trapping. No pathologically enlarged lymph nodes seen in the chest. Expiratory phase imaging demonstrates approximately 50% decrease in cross-sectional area of the tracheobronchial lumen. Bibasilar atelectasis. No consolidation, pleural effusion or pneumothorax. Solid pulmonary nodule of the right upper lobe measuring 4 mm on series 10, image 48, new when compared with 2018 prior exam. Upper Abdomen: No acute abnormality. Musculoskeletal: No chest wall mass or suspicious bone lesions identified. IMPRESSION: 1. Approximately 50% decrease in cross-sectional area of the tracheobronchial lumen with expiratory phase imaging,  findings are suggestive of excessive dynamic airway collapse. 2. Mild bilateral air trapping, which can be seen in setting of small airways disease. 3. Solid pulmonary nodule of the right upper lobe measuring 4 mm which is new when compared with 2018 prior. No follow-up needed if patient is low-risk. Non-contrast chest CT can be considered in 12 months if patient is high-risk. This recommendation follows the consensus statement: Guidelines for Management of Incidental Pulmonary Nodules Detected on CT Images: From the Fleischner Society 2017; Radiology 2017; 284:228-243. Electronically Signed    By: Yetta Glassman M.D.   On: 06/08/2021 16:27   DG Chest Port 1 View  Result Date: 06/19/2021 CLINICAL DATA:  71 year old female with respiratory distress. EXAM: PORTABLE CHEST 1 VIEW COMPARISON:  High-resolution chest CT 06/08/2021 and earlier. FINDINGS: Portable AP semi upright view at 0604 hours. Mildly rotated to the right. Lower lung volumes. Normal cardiac size and mediastinal contours. Allowing for portable technique the lungs are clear. Visualized tracheal air column is within normal limits. No pneumothorax or pleural effusion. No acute osseous abnormality identified. Paucity of bowel gas in the upper abdomen. IMPRESSION: No acute cardiopulmonary abnormality. Electronically Signed   By: Genevie Ann M.D.   On: 06/19/2021 06:24   DG CHEST PORT 1 VIEW  Result Date: 06/07/2021 CLINICAL DATA:  Elevated white blood cell count EXAM: PORTABLE CHEST 1 VIEW COMPARISON:  06/05/2021 FINDINGS: Numerous leads and wires project over the chest. Midline trachea. Normal heart size and mediastinal contours. No pleural effusion or pneumothorax. Left base airspace disease. Mild right infrahilar atelectasis. IMPRESSION: Left base airspace disease, either atelectasis or early infection. Electronically Signed   By: Abigail Miyamoto M.D.   On: 06/07/2021 08:21   VAS [place] LOWER EXTREMITY VENOUS (DVT)  Result Date: 06/06/2021  Lower Venous DVT Study Patient Name:  JUSTISE EHMANN  Date of Exam:   06/06/2021 Medical Rec #: [id number]       Accession #:    [id number] Date of Birth: [birth year]        Patient Gender: F Patient Age:   89 years [facility]am [facility] Procedure:      VAS [place] LOWER EXTREMITY VENOUS (DVT) Referring Phys: Collier Bullock --------------------------------------------------------------------------------  Indications: Edema.  Risk Factors: None identified. Limitations: Poor ultrasound/tissue interface and body habitus. Comparison Study: No prior studies. Performing Technologist: Oliver Hum RVT   Examination Guidelines: A complete evaluation includes B-mode imaging, spectral Doppler, color Doppler, and power Doppler as needed of all accessible portions of each vessel. Bilateral testing is considered an integral part of a complete examination. Limited examinations for reoccurring indications may be performed as noted. The reflux portion of the exam is performed with the patient in reverse Trendelenburg.  +---------+---------------+---------+-----------+----------+--------------+  RIGHT     Compressibility Phasicity Spontaneity Properties Thrombus Aging  +---------+---------------+---------+-----------+----------+--------------+  CFV       Full            Yes       Yes                                    +---------+---------------+---------+-----------+----------+--------------+  SFJ       Full                                                             +---------+---------------+---------+-----------+----------+--------------+  FV Prox   Full                                                             +---------+---------------+---------+-----------+----------+--------------+  FV Mid    Full                                                             +---------+---------------+---------+-----------+----------+--------------+  FV Distal                 Yes       Yes                                    +---------+---------------+---------+-----------+----------+--------------+  PFV       Full                                                             +---------+---------------+---------+-----------+----------+--------------+  POP       Full            Yes       Yes                                    +---------+---------------+---------+-----------+----------+--------------+  PTV       Full                                                             +---------+---------------+---------+-----------+----------+--------------+  PERO      Full                                                              +---------+---------------+---------+-----------+----------+--------------+   +---------+---------------+---------+-----------+----------+--------------+  LEFT      Compressibility Phasicity Spontaneity Properties Thrombus Aging  +---------+---------------+---------+-----------+----------+--------------+  CFV       Full            Yes       Yes                                    +---------+---------------+---------+-----------+----------+--------------+  SFJ       Full                                                             +---------+---------------+---------+-----------+----------+--------------+  FV Prox   Full                                                             +---------+---------------+---------+-----------+----------+--------------+  FV Mid    Full                                                             +---------+---------------+---------+-----------+----------+--------------+  FV Distal Full                                                             +---------+---------------+---------+-----------+----------+--------------+  PFV       Full                                                             +---------+---------------+---------+-----------+----------+--------------+  POP       Full            Yes       Yes                                    +---------+---------------+---------+-----------+----------+--------------+  PTV       Full                                                             +---------+---------------+---------+-----------+----------+--------------+  PERO      Full                                                             +---------+---------------+---------+-----------+----------+--------------+     Summary: RIGHT: - There is no evidence of deep vein thrombosis in the lower extremity. However, portions of this examination were limited- see technologist comments above.  - No cystic structure found in the popliteal fossa.  LEFT: - There is no evidence of deep vein  thrombosis in the lower extremity. However, portions of this examination were limited- see technologist comments above.  - No cystic structure found in the popliteal fossa.  *See table(s) above for measurements and observations. Electronically signed by Orlie Pollen on 06/06/2021 at 7:53:30 PM.    Final     Microbiology: Results for orders placed or performed during the hospital encounter of 06/19/21  Resp Panel by RT-PCR (Flu A&B, Covid) Nasopharyngeal Swab  Status: None   Collection Time: 06/19/21  6:00 AM   Specimen: Nasopharyngeal Swab; Nasopharyngeal(NP) swabs in vial transport medium  Result Value Ref Range Status   SARS Coronavirus 2 by RT PCR NEGATIVE NEGATIVE Final    Comment: (NOTE) SARS-CoV-2 target nucleic acids are NOT DETECTED.  The SARS-CoV-2 RNA is generally detectable in upper respiratory specimens during the acute phase of infection. The lowest concentration of SARS-CoV-2 viral copies this assay can detect is 138 copies/mL. A negative result does not preclude SARS-Cov-2 infection and should not be used as the sole basis for treatment or other patient management decisions. A negative result may occur with  improper specimen collection/handling, submission of specimen other than nasopharyngeal swab, presence of viral mutation(s) within the areas targeted by this assay, and inadequate number of viral copies(<138 copies/mL). A negative result must be combined with clinical observations, patient history, and epidemiological information. The expected result is Negative.  Fact Sheet for Patients:  EntrepreneurPulse.com.au  Fact Sheet for Healthcare Providers:  IncredibleEmployment.be  This test is no t yet approved or cleared by the [place] FDA and  has been authorized for detection and/or diagnosis of SARS-CoV-2 by FDA under an Emergency Use Authorization (EUA). This EUA will remain  in effect (meaning this test can be used) for  the duration of the COVID-19 declaration under Section 564(b)(1) of the Act, 21 U.S.C.section 360bbb-3(b)(1), unless the authorization is terminated  or revoked sooner.       Influenza A by PCR NEGATIVE NEGATIVE Final   Influenza B by PCR NEGATIVE NEGATIVE Final    Comment: (NOTE) The Xpert Xpress SARS-CoV-2/FLU/RSV plus assay is intended as an aid in the diagnosis of influenza from Nasopharyngeal swab specimens and should not be used as a sole basis for treatment. Nasal washings and aspirates are unacceptable for Xpert Xpress SARS-CoV-2/FLU/RSV testing.  Fact Sheet for Patients: EntrepreneurPulse.com.au  Fact Sheet for Healthcare Providers: IncredibleEmployment.be  This test is not yet approved or cleared by the [place] FDA and has been authorized for detection and/or diagnosis of SARS-CoV-2 by FDA under an Emergency Use Authorization (EUA). This EUA will remain in effect (meaning this test can be used) for the duration of the COVID-19 declaration under Section 564(b)(1) of the Act, 21 U.S.C. section 360bbb-3(b)(1), unless the authorization is terminated or revoked.  Performed at [facility], [place] [address] [place], [place] [address]   Respiratory (~20 pathogens) panel by PCR     Status: None   Collection Time: 06/19/21  6:00 AM   Specimen: Nasopharyngeal Swab; Respiratory  Result Value Ref Range Status   Adenovirus NOT DETECTED NOT DETECTED Final   Coronavirus 229E NOT DETECTED NOT DETECTED Final    Comment: (NOTE) The Coronavirus on the Respiratory Panel, DOES NOT test for the novel  Coronavirus (2019 nCoV)    Coronavirus HKU1 NOT DETECTED NOT DETECTED Final   Coronavirus NL63 NOT DETECTED NOT DETECTED Final   Coronavirus OC43 NOT DETECTED NOT DETECTED Final   Metapneumovirus NOT DETECTED NOT DETECTED Final   Rhinovirus / Enterovirus NOT DETECTED NOT DETECTED Final   Influenza A NOT DETECTED NOT DETECTED  Final   Influenza B NOT DETECTED NOT DETECTED Final   Parainfluenza Virus 1 NOT DETECTED NOT DETECTED Final   Parainfluenza Virus 2 NOT DETECTED NOT DETECTED Final   Parainfluenza Virus 3 NOT DETECTED NOT DETECTED Final   Parainfluenza Virus 4 NOT DETECTED NOT DETECTED Final   Respiratory Syncytial Virus NOT DETECTED NOT DETECTED Final  Bordetella pertussis NOT DETECTED NOT DETECTED Final   Bordetella Parapertussis NOT DETECTED NOT DETECTED Final   Chlamydophila pneumoniae NOT DETECTED NOT DETECTED Final   Mycoplasma pneumoniae NOT DETECTED NOT DETECTED Final    Comment: Performed at [facility], [place] [address], [place], [place] [address]    Labs: CBC: Recent Labs  Lab 06/19/21 0440 06/20/21 0513 06/21/21 0407 06/22/21 0621 06/23/21 0408 06/24/21 0413  WBC 12.8* 21.2* 19.7* 20.5* 12.9* 13.0*  NEUTROABS 8.1*  --  17.7* 18.2* 11.3* 11.3*  HGB 11.6* 11.3* 10.5* 11.0* 9.8* 10.4*  HCT 36.3 34.9* 32.8* 34.8* 29.7* 32.3*  MCV 97.8 98.3 98.5 98.6 97.1 97.6  PLT 336 334 319 321 243 643   Basic Metabolic Panel: Recent Labs  Lab 06/20/21 0513 06/21/21 0407 06/22/21 0621 06/23/21 0408 06/24/21 0413  NA 132* 130* 131* 134* 134*  K 4.6 4.9 4.8 4.6 4.3  CL 100 100 102 102 100  CO2 19* 20* 22 25 25   GLUCOSE 340* 289* 306* 339* 280*  BUN 38* 56* 46* 44* 41*  CREATININE 1.62* 1.93* 1.45* 1.53* 1.38*  CALCIUM 9.7 9.4 8.9 8.7* 9.0  MG  --  2.5* 2.6* 2.6* 2.6*   Liver Function Tests: Recent Labs  Lab 06/19/21 0440 06/20/21 0513  AST 18 20  ALT 19 20  ALKPHOS 68 69  BILITOT 0.4 0.3  PROT 7.6 7.8  ALBUMIN 4.1 4.4   CBG: Recent Labs  Lab 06/23/21 0744 06/23/21 1231 06/23/21 1619 06/23/21 2130 06/24/21 0752  GLUCAP 292* 307* 315* 207* 284*    Discharge time spent: greater than 30 minutes.  Signed: Aline August, MD [facility] 06/24/2021

## 2021-06-24 NOTE — Progress Notes (Signed)
Notified on call provider about patient's elevated BP after giving PRN IV metoprolol. Patient's BP with full set of vitals was 176/78. Gave PRN IV metoprolol for the elevated BP. Rechecked patient's BP and it was 180/85.

## 2021-06-24 NOTE — TOC Transition Note (Signed)
Transition of Care [facility]) - CM/SW Discharge Note   Patient Details  Name: ILDA LASKIN MRN: [id number] Date of Birth: 07-05-1950  Transition of Care [facility]) CM/SW Contact:  Leeroy Cha, RN Phone Number: 06/24/2021, 8:46 AM   Clinical Narrative:    Pt dcd to go home with self care   Final next level of care: [place] Barriers to Discharge: No Barriers Identified   Patient Goals and CMS Choice Patient states their goals for this hospitalization and ongoing recovery are:: To get better CMS Medicare.gov Compare Post Acute Care list provided to:: Patient Choice offered to / list presented to : Patient  Discharge Placement                       Discharge Plan and Services   Discharge Planning Services: CM Consult Post Acute Care Choice: Home Health, Durable Medical Equipment                               Social Determinants of Health (SDOH) Interventions     Readmission Risk Interventions No flowsheet data found.

## 2021-06-24 NOTE — Plan of Care (Signed)
°  Problem: Education: °Goal: Knowledge of disease or condition will improve °Outcome: Progressing °  °Problem: Activity: °Goal: Ability to tolerate increased activity will improve °Outcome: Progressing °  °Problem: Respiratory: °Goal: Ability to maintain a clear airway will improve °Outcome: Progressing °Goal: Ability to maintain adequate ventilation will improve °Outcome: Progressing °  °

## 2021-06-24 NOTE — Plan of Care (Signed)
Patient discharged.

## 2021-06-24 NOTE — Progress Notes (Signed)
Patient got up during the evening shift and ambulated around the [facility] twice and tolerated well.

## 2021-06-24 NOTE — Progress Notes (Signed)
Inpatient Diabetes Program Recommendations  AACE/ADA: New Consensus Statement on Inpatient Glycemic Control (2015)  Target Ranges:  Prepandial:   less than 140 mg/dL      Peak postprandial:   less than 180 mg/dL (1-2 hours)      Critically ill patients:  140 - 180 mg/dL   Lab Results  Component Value Date   KBTCYE 185 (H) 06/24/2021   HGBA1C 8.2 (H) 06/05/2021    Review of Glycemic Control  Diabetes history: DM2 Outpatient Diabetes medications:  Current orders for Inpatient glycemic control: Semglee 20 QD, Novolog 0-15 TID with meals and 0-5 HS + 10 units TID  On Solumedrol 60 Q12H HgbA1C - 8.2%, likely elevated from steroids  Inpatient Diabetes Program Recommendations:    Increase Semglee to 24 units QD  F/U with PCP regarding glucose management.  Thank you. Lorenda Peck, RD, LDN, CDE Inpatient Diabetes Coordinator [phone]

## 2021-06-27 ENCOUNTER — Encounter: Payer: Medicare Other | Admitting: Physical Therapy

## 2021-06-28 ENCOUNTER — Other Ambulatory Visit: Payer: Self-pay

## 2021-06-28 ENCOUNTER — Ambulatory Visit (INDEPENDENT_AMBULATORY_CARE_PROVIDER_SITE_OTHER): Payer: Medicare Other | Admitting: Internal Medicine

## 2021-06-28 ENCOUNTER — Encounter: Payer: Self-pay | Admitting: Internal Medicine

## 2021-06-28 DIAGNOSIS — R058 Other specified cough: Secondary | ICD-10-CM

## 2021-06-28 DIAGNOSIS — J4530 Mild persistent asthma, uncomplicated: Secondary | ICD-10-CM

## 2021-06-28 DIAGNOSIS — J383 Other diseases of vocal cords: Secondary | ICD-10-CM

## 2021-06-28 MED ORDER — AZITHROMYCIN 250 MG PO TABS: ORAL_TABLET | ORAL | 0 refills | Status: DC

## 2021-06-28 MED ORDER — ACETAMINOPHEN-CODEINE #3 300-30 MG PO TABS: 1.0000 | ORAL_TABLET | ORAL | 0 refills | Status: AC | PRN

## 2021-06-28 NOTE — Progress Notes (Signed)
Subjective:   Patient ID: [name], female    DOB: Jun 01, 1950    MRN: [id number]    Brief patient profile:  70   yobf quit smoking 2005 with nl pfts 2012  with recurrent non-specific resp flares with nl pfts during flares c/w pseudoasthma.     History of Present Illness  01/24/2016  Extended post hosp f/u ov/ transition of care/ /[name] re: dtca asthma/ vcd symb 160 and pred 5 x 10 mg daily  Chief Complaint  Patient presents with   Follow-up    Pt. recently got out the [facility] for an asthma attack, Pt. states her breathing remains the same, coughing with some yellow to white mucus,Has been needing to use Ventolin more often  last doing well 2 months prior to OV   While on Symb 160/ gerd rx but not dosing ac as rec  Waking up prematurely x 2 months with cough/wheeze/ sob but never contacted me as per the instructions rec Plan A = Automatic = dulera 100 Take 2 puffs first thing in am and then another 2 puffs about 12 hours later.                                      Nexum 40 mg Take 30-60 min before first meal of the day and Pepcid AC 20 mg  at bedtime Work on inhaler technique  Plan B = Backup Only use your albuterol as a rescue medication Plan C = Crisis - only use your albuterol nebulizer if you first try Plan B and it fails to help > ok to use the nebulizer up to every 4 hours but if start needing it regularly call for immediate appointment GERD diet  Take delsym two tsp every 12 hours and use the flutter valve as much as possible and supplement if needed with  tramadol 50 mg up to 2 every 4 hours to suppress the urge to cough  Once you have eliminated the cough for 3 straight days try reducing the tramadol first,  then the delsym as tolerated.   Prednisone 5 mg x 8, then 6, then 4 then 2, then 1 daily x 2 days and off  please schedule a follow up office visit in 2 weeks, sooner if needed with all meds/ inhalers/ neb solutions in hand     09/18/2016  f/u ov/[name] re:  UACS vs  Asthma brought med calendar but not updated / on symb 160 / max gerd rx  Chief Complaint  Patient presents with   Follow-up    Pt c/o occ wheezing. She has not used rescue inhaler or neb recently.   still feels wheezing  And doe = MMRC1 = can walk nl pace, flat grade, can't hurry or go uphills or steps s sob rec Change the symbicort to 80 Take 2 puffs first thing in am and then another 2 puffs about 12 hours later.  Work on inhaler technique:  Use the med calendar daily to organize your medications as a "checklist" See [name] NP in 4  weeks with all your medications> never happened    Admit date: 12/19/2017 Discharge date: 12/24/2017    D/c home with steroids taper, monitor blood glucose while on steroids She reports run out of pulmicort nebs, new prescription provided, she is to follow up with Dr Melvyn Novas to further discuss asthma/copd meds, she is concerned about cost of these  meds   Discharge Diagnoses:      Active Hospital Problems    Diagnosis Date Noted   Acute bronchitis with asthma with acute exacerbation 01/16/2016   Acute renal failure superimposed on stage 3 chronic kidney disease (HCC) 07/17/2016   Chronic diastolic CHF (congestive heart failure) ([place]) 07/17/2016   Acute respiratory failure with hypoxia (HCC)     Upper airway cough syndrome vs Asthma  08/28/2014   Diabetes mellitus type 2, controlled ([place]) 08/06/2014   Essential hypertension 07/21/2008            12/28/2017 ext post hosp f/u ov/[name] re: confused with meds again / only slightly better breathing  Chief Complaint  Patient presents with   Follow-up    Breathing has improved slightly. She has been out of brovana due to high cost x 3 days. She has had to use her albuterol inhaler and neb daily since then.   Dyspnea:  50 ft = About the same as usual Cough: after supper is the worse, but not much mucus Sleeping: wakes up coughing 30 degrees due to sensation of reflux    SABA use: last 5 h prior to OV   02:  none  Sleeping at 30 degrees    rec Plan A = Automatic = Performist 20 mcg one vial  with pulmocort one half vial twice daily  Gabapentin 100 mg three times daily Nexium Take 30- 60 min before your first and last meals of the day and zantac (ranitidine) at bedtime  Along with chlorpheniramine 4 mg 1 or 2 also at bedtime (over the counter)  Continue prednisone 10 mg every day  Plan B = Backup for breathing Only use your albuterol as a rescue medication  Plan B = Backup for coughing  Add tramadol 50 mg 1-2 every 4 hours if needed  Plan C = Crisis - only use your albuterol nebulizer if you first try Plan B and it fails to help > ok to use the nebulizer up to every 4 hours but if start needing it regularly call for immediate appointment     03/11/2018  f/u ov/[name] re: pseudoasthma > asthma Chief Complaint  Patient presents with   Follow-up    Cough had improved and then started back 1 day ago.  Cough is now non prod.  She is using her albuterol inhaler 2 x daily on average. She has not needed albuterol neb.    Dyspnea:  MMRC2 = can't walk a nl pace on a flat grade s sob but does fine slow and flat slowed by knee  Cough: noct > day  Sleeping: 45 degrees due to subjective reflux despite max acid suppresion  SABA use: as above - never noct 02: none   rec Gabapentin 100 mg four  times daily  Take chortab 4 mg x 2 at bedtime and up to every 4 hours during the day as needed for drainage  Take delsym two tsp every 12 hours and supplement if needed with  tramadol 50 mg up to 2 every 4 hours to suppress the urge to cough. Swallowing water and/or using ice chips/non mint and menthol containing candies (such as lifesavers or sugarless jolly ranchers) are also effective.  You should rest your voice and avoid activities that you know make you cough. Once you have eliminated the cough for 3 straight days try reducing the tramadol first,  then the delsym as tolerated.  Plan D = Prednisone x 6 days if  not cough/ sob  not better See calendar for specific medication instructions and bring it back for each and every office visit for every healthcare provider you see.  Without it,  you may not receive the best quality medical care that we feel you deserve. Please schedule a follow up office visit in 4 weeks, call sooner if needed with all medications /inhalers/ solutions in hand so we can verify exactly what you are taking. This includes all medications from all doctors and over the [place] separate them into two bags:  the ones you take automatically, no matter what, vs the ones you take just when you feel you need them "BAG #2 is UP TO YOU"  - this will really help [place] help you take your medications more effectively.     03/15/18 back to ER - did not activate action plan prior (never took more than 1 tramadol every 12 hours to control cough.   03/20/2018  Extended post hops  f/u ov/[name] re: transition of care re   vcd vs asthma  Chief Complaint  Patient presents with   Follow-up    recent admission 03/15/18- pt reports of wheezing, sob with exertion, no prod cough & chest tightness.   Dyspnea:  MMRC4  = sob if tries to leave home or while getting dressed   - no better now vs during hosp stay. Seen by Dr [place] Bells who strongly supported vcd dx during admit Cough: dry cough settles down overnight and with plb as does the "wheeze"  Sleeping: 45 degrees SABA use: last neb 8 h prior to OV  "did not help" 02: no  rec Increase gabapentin to 100 mg take 2 (=200)  four times daily as per med calendar For cough > delsym 2 tsp every 12 hours with use of flutter valve as much as possible  and supplement with tramadol 50 mg up to 1-2 every 4 hours as needed  For breathing problems not improving with use of nebulizer > Prednisone 10 mg take  4 each am x 2 days,   2 each am x 2 days,  1 each am x 2 days and stop  GERD diet / lifestyle recs Keep previous appt with your medications in 2 bags     02/04/19 televisit Stop gabapentin completely since you are only taking one at bedtime but add it back if start coughing again for any reason and ok to build up to 4 x daily  If your breathing gets worse, increase the Performist to 20 mcg (one vial) every 12 hours instead of just once a day.    01/12/2020  f/u ov/[name] re:   Asthma with uacs / continues to struggle with concept of med reconciliation Chief Complaint  Patient presents with   Follow-up    "feeling good"  Dyspnea:  Walking neighborhood, chasing kids at age 49 and 30 Cough: none / still hoarse and clearing throat daytime  Sleeping : no resp symptoms  02 none  rec Plan A = Automatic = Always=    Performist and the budesonide twice daily  And continue esmeprazole 40 mg Take 30-60 min before first meal of the day and 20 mg Take 30-60 min before last meal of the day  Plan B = Backup (to supplement plan A, not to replace it) Only use your albuterol inhaler as a rescue medication Plan C = Crisis (instead of Plan B but only if Plan B stops working) - only use your albuterol nebulizer if you first try Plan  B  Plan D = Deltasone if ABC not working  - Prednisone 10 mg take  4 each am x 2 days,   2 each am x 2 days,  1 each am x 2 days and stop     09/08/2020  f/u ov/[name] re: AB/ uacs much better on performist/bud with still poor hfa  Chief Complaint  Patient presents with   Follow-up    Pt states she has been doing well since last visit. States her cough is gone and denies any current complaints.   Dyspnea:  No limits  Cough: none  Sleeping: ok flat bed/ 2 pillows  SABA use: none  02: none Covid status:   vax x 3  Rec Ok to reduce your perforomist / budesonide once daily as long as doing great - otherwise twice daily  Esomeprazole is Take 30- 60 min before your first and last meals of the day     03/15/2021  f/u ov/[name] re: AB  maint on perfomist/bud/ singulair each am   Chief Complaint  Patient presents with    Follow-up    Lumpectomy 9/22.  Breathing is ok.  Dyspnea:  Not limited by breathing from desired activities  daily walks thru neighborhood x 30 min some hills Cough: none  Sleeping: on either side flat bed 2 pillows  SABA use: with cold  02: none  Covid status:   vax x 4   Rec Admit date:     05/13/2021  Discharge date: 05/14/21    PCP: [name], MD     Pt needs ENT referral   Discharge Diagnoses Principal Problem:   Hypomagnesemia   Chronic diastolic CHF (congestive heart failure) (HCC)   Severe persistent asthma without complication   Chronic kidney disease, stage 3a ([place])   CAD in native artery   Diabetes mellitus type 2, controlled ([place])   Essential hypertension   G E R D   Depression with anxiety   Hyperlipidemia   Vocal cord dysfunction   Hospital Course    * Hypomagnesemia- (present on admission) Presents with complaints of muscle cramps, body aches as well as muscle spasms  x few days.PTA Lab work at the PCPs office showed hypomagnesemia. Patient received 4 g of IV magnesium in the ER. No event on telemetry. Potassium and magnesium level stable.  We will continue supplements on discharge.   Severe persistent asthma without complication- (present on admission) Patient denies having any wheezing when she went to her PCPs office. Possibly that her wheezing actually started getting worse on 1/13 only. Continue nebulizer therapy, Brovana and Pulmicort.  As needed DuoNebs.  I do not think the patient requires steroids for now.     Chronic diastolic CHF (congestive heart failure) ([place])- (present on admission) Volume status adequate. Does not appear to be volume overloaded.  Weight actually down 15 pound after discharge from the hospital.     Chronic kidney disease, stage 3a ([place])- (present on admission) Renal function stable close to baseline of 1.16. Monitor.   CAD in native artery- (present on admission) Myoview in 2022 negative. Monitor for now.      Diabetes mellitus type 2, controlled ([place]) Hemoglobin A1c 1.8. On metformin at home   Essential hypertension- (present on admission) Blood pressure stable. Continue home regimen.   G E R D- (present on admission) Continue PPI.   Depression with anxiety- (present on admission) Continue Xanax home regimen.   Hyperlipidemia- (present on admission) On Lipitor at home. Continue.   Vocal  cord dysfunction- (present on admission) Suspecting that is responsible for patient's upper airway wheezing. Recommend ENT follow-up and referral outpatient.       05/17/2021 post hosp  f/u ov/[name] re: asthma/ vcd   maint on yup/performist/ singulair   Chief Complaint  Patient presents with   Follow-up    She states hospitalized in Dec 2022 with asthma flare- breathing has not returned to baseline but is some better. She uses her albuterol inhaler approx 4 x per wk and rarely uses duoneb.   Dyspnea:  50 ft then sob  Cough: gone / never had st but hoarseness better  Sleeping: waking up due to cough/ flat bed 2 pillows  SABA use: hfa not neb  Rec Bed blocks (risers) 6-8 in under head of bed Only use your albuterol as a rescue medication  Work on inhaler technique:  If not better change to over albuterol (not duoneb) nebulizer up to every 4 hours as needed   and go ahead and take Prednisone 10 mg take  4 each am x 2 days,   2 each am x 2 days,  1 each am x 2 days and stop (refillable)  Change nexium 40 mg Take 30- 60 min before your first and last meals of the day     06/28/2021 post hosp  f/u ov/[name] re: vcd/ asthma    maint on bud/ performist   Chief Complaint  Patient presents with   Follow-up    Follow up. Patient says her chest still hurts and she's still wheezing.   Dyspnea:  50 ft  Cough: severe slt yellowish / harsh hacking cough  Sleeping: hob 2 blocks  SABA use: confused again re maint vs prn  02: none  Ent eval c/w severe edema from GERD  06/23/21      No obvious day to day  or daytime variability or assoc excess/ purulent sputum or mucus plugs or hemoptysis.   Also denies any obvious fluctuation of symptoms with weather or environmental changes or other aggravating or alleviating factors except as outlined above   No unusual exposure hx or h/o childhood pna/ asthma or knowledge of premature birth.  Current Allergies, Complete Past Medical History, Past Surgical History, Family History, and Social History were reviewed in [employer] record.  ROS  The following are not active complaints unless bolded Hoarseness, sore throat, dysphagia, dental problems, itching, sneezing,  nasal congestion or discharge of excess mucus or purulent secretions, ear ache,   fever, chills, sweats, unintended wt loss or wt gain, classically pleuritic or exertional cp,  orthopnea pnd or arm/hand swelling  or leg swelling, presyncope, palpitations, abdominal pain, anorexia, nausea, vomiting, diarrhea  or change in bowel habits or change in bladder habits, change in stools or change in urine, dysuria, hematuria,  rash, arthralgias, visual complaints, headache, numbness, weakness or ataxia or problems with walking or coordination,  change in mood or  memory.        Current Meds  Medication Sig   albuterol (PROAIR HFA) 108 (90 Base) MCG/ACT inhaler Inhale 2 puffs into the lungs every 4 (four) hours as needed for wheezing or shortness of breath.   albuterol (PROVENTIL) (2.5 MG/3ML) 0.083% nebulizer solution Take 3 mLs (2.5 mg total) by nebulization every 4 (four) hours as needed for wheezing or shortness of breath.   ALPRAZolam (XANAX) 0.5 MG tablet Take 1 tablet (0.5 mg total) by mouth 2 (two) times daily as needed for anxiety. (Patient taking differently: Take 0.5  mg by mouth See admin instructions. Take 0.5 mg by mouth at bedtime and an additional 0.5 mg once a day as needed for anxiety)   alum & mag hydroxide-simeth (MAALOX/MYLANTA) 200-200-20 MG/5ML suspension Take 30 mLs  by mouth every 4 (four) hours as needed for indigestion.   atorvastatin (LIPITOR) 40 MG tablet Take 40 mg by mouth at bedtime.   Biotin 5000 MCG CAPS Take 5,000 mcg by mouth daily.   budesonide (PULMICORT) 0.5 MG/2ML nebulizer solution USE 1 VIAL IN NEBULIZER IN THE MORNING AND AT BEDTIME (Patient taking differently: Take 0.5 mg by nebulization in the morning and at bedtime.)   chlorpheniramine (CHLOR-TRIMETON) 4 MG tablet Take 1 tablet (4 mg total) by mouth every 4 (four) hours.   diltiazem (CARDIZEM CD) 240 MG 24 hr capsule Take 1 capsule (240 mg total) by mouth daily.   divalproex (DEPAKOTE ER) 500 MG 24 hr tablet Take 1 tablet (500 mg total) by mouth at bedtime.   fluticasone (FLONASE) 50 MCG/ACT nasal spray Place 2 sprays into both nostrils daily as needed for allergies.   formoterol (PERFOROMIST) 20 MCG/2ML nebulizer solution USE 1 VIAL  IN  NEBULIZER TWICE  DAILY - morning and evening (Patient taking differently: 20 mcg 2 (two) times daily.)   glipiZIDE (GLUCOTROL) 5 MG tablet Take 5 mg by mouth daily before breakfast.   guaiFENesin-dextromethorphan (ROBITUSSIN DM) 100-10 MG/5ML syrup Take 10 mLs by mouth every 4 (four) hours as needed for cough.   hydrALAZINE (APRESOLINE) 100 MG tablet Take 1 tablet (100 mg total) by mouth 3 (three) times daily. Please make yearly appt with Dr. Marlou Porch for April 2023 for future refills. Thank you 1st attempt (Patient taking differently: Take 100 mg by mouth 3 (three) times daily.)   latanoprost (XALATAN) 0.005 % ophthalmic solution Place 1 drop into both eyes at bedtime.   magnesium oxide (MAG-OX) 400 (240 Mg) MG tablet Take 1 tablet (400 mg total) by mouth daily.   memantine (NAMENDA) 10 MG tablet Take 1 tablet (10 mg total) by mouth 2 (two) times daily. (Patient taking differently: Take 10 mg by mouth in the morning and at bedtime.)   metFORMIN (GLUCOPHAGE) 1000 MG tablet Take 1,000 mg by mouth 2 (two) times daily with a meal.   mometasone-formoterol  (DULERA) 100-5 MCG/ACT AERO Inhale 2 puffs into the lungs 2 (two) times daily.   montelukast (SINGULAIR) 10 MG tablet Take 1 tablet (10 mg total) by mouth at bedtime.   Multiple Vitamin (MULTIVITAMIN WITH MINERALS) TABS tablet Take 1 tablet by mouth daily with breakfast.   nebivolol (BYSTOLIC) 10 MG tablet Take 1 tablet (10 mg total) by mouth daily.   nitroGLYCERIN (NITROSTAT) 0.4 MG SL tablet Place 1 tablet (0.4 mg total) under the tongue every 5 (five) minutes as needed for chest pain. Don't exceed more than 3 doses in 15 mins   oxyCODONE-acetaminophen (PERCOCET/ROXICET) 5-325 MG tablet Take 1 tablet by mouth every 6 (six) hours as needed (for pain).   pantoprazole (PROTONIX) 40 MG tablet Take 1 tablet (40 mg total) by mouth 2 (two) times daily before a meal.   predniSONE (DELTASONE) 20 MG tablet Take 2 tablets (40 mg total) by mouth daily with breakfast for 7 days.   Probiotic Product (EQ PROBIOTIC) CAPS Take 1 capsule by mouth every morning.   sucralfate (CARAFATE) 1 GM/10ML suspension Take 10 mLs (1 g total) by mouth 4 (four) times daily -  with meals and at bedtime.  Objective:   Physical Exam  Wt   06/28/2021      173  05/17/2021      165 03/15/2021    161  09/08/2020      153 01/12/2020      150  09/09/2019     147  03/12/2019   157  11/04/2018       157  02/23/2016   168 >  04/07/2016  168 > 05/08/2016    168 >  07/17/2016  162 > 09/18/2016    164  > 09/18/2017  160 > 12/28/2017 167  > 03/11/2018  164 > 03/20/2018   160 > 04/05/2018   171 > 04/18/2018  165   Vital signs reviewed  06/28/2021  - Note at rest 02 sats  100% on RA   General appearance:    slt cushingnoid amb very hoarse bf prominent volutary pseudowheeze comes and goes depending on whether she's being observed   HEENT : pt wearing mask not removed for exam due to covid -19 concerns.    NECK :  without JVD/Nodes/TM/ nl carotid upstrokes bilaterally   LUNGS: no acc muscle use,  Nl  contour chest which is clear to A and P bilaterally without cough on insp or exp maneuvers   CV:  RRR  no s3 or murmur or increase in P2, and no edema   ABD:  soft and nontender with nl inspiratory excursion in the supine position. No bruits or organomegaly appreciated, bowel sounds nl  MS:  Nl gait/ ext warm without deformities, calf tenderness, cyanosis or clubbing No obvious joint restrictions   SKIN: warm and dry without lesions    NEURO:  alert, approp, nl sensorium with  no motor or cerebellar deficits apparent.        I personally reviewed images and agree with radiology impression as follows:   Chest CT 06/19/21  1. No acute finding in the Chest. Normal patency of the major airways today, see HR Chest CT 06/08/2021 regarding findings of excessive dynamic airway collapse.

## 2021-06-28 NOTE — Patient Instructions (Addendum)
I am referring you to Garald Balding, speech terapist for your VCD  Pantoprazole is 40 mg Take 30- 60 min before your first and last meals of the day and famotidine 20 mg before bedtime    Plan A = Automatic = Always=   Budesonide  0.5 mg each am with formoterol 20 mcg and repeat the formoterol in 12 hours   Plan B = Backup (to supplement plan A, not to replace it) Only use your albuterol  as a rescue medication to be used if you can't catch your breath by resting or doing a relaxed purse lip breathing pattern.  - The less you use it, the better it will work when you need it. - Ok to use the nebulizer every 4 hours if you must but call for appointment if use goes up over your usual need - Don't leave home without it !!  (think of it like the spare tire for your car)   Zpak   Finish prednisone   Keep the candy handy  For cough > mucinex dm 1200 mg every 12 hours and  if still coughing > tylenol #3 one every 4 hours as needed and use the flutter valve as much as you can  Please schedule a follow up office visit in 4 weeks, sooner if needed

## 2021-06-29 ENCOUNTER — Encounter: Payer: Self-pay | Admitting: Internal Medicine

## 2021-06-29 NOTE — Assessment & Plan Note (Signed)
Onset of symptoms reported  2012 p quit smoking  2005  ?- FEN0 01/24/2016 = 13 with active symptoms on symb 160 2bid > changed to dulera 100  ?- Spirometry 02/23/2016  NO signifiant obstruction with active symptoms  ?- 02/23/2016  After extensive coaching HFA effectiveness =    75%  ?- FENO 02/23/2016  =  12   On dulera 100 2bid (unable to confirm adeherence as did not bring it ?- Singulair 10 mg daily added 02/23/2016 to dulera 100 2bid with 2 week sample only ? (unable to verify she used it) ?- Allergy profile 02/23/16  >  Eos 0.0 /  IgE  87 pos  RAST  Grass/ trees  ?- 04/07/2016   symb 80 x one sample given ?- FENO 04/07/2016  =   43 ? Really on meds ?  ?- 05/08/2016  After extensive coaching HFA effectiveness =    75% > continue symb 80 2bid ?- 07/17/2016 added singulair maint  ? ?- FENO 09/18/2016  =   15 on symb 160 2bid > try 80 2bid  ?- flare late feb 2019 ? What maint rx she was taking but on admit "non-adherent per notes 08/12/17  ?- 09/18/2017  After extensive coaching inhaler device  effectiveness =    75% from a baseline of 50% > restart symb 80 2bid ?- FENO 09/18/2017  =   20 on symb 160 ? - Spirometry 09/18/2017  No obstruction at all with active wheeze ?-  09/09/2019 added prednisone as "plan D"  ?- 09/08/2020  After extensive coaching inhaler device,  effectiveness =    50% from baseline of 25% (delayed breath in)  ?  06/23/21 Ent eval c/w severe edema from GERD ? ?Taper pred off and use flutter valve/ purse lip breathing to control upper airway wheeze and f/u w/in  4 weeks with all meds in hand using a trust but verify approach to confirm accurate Medication  Reconciliation The principal here is that until we are certain that the  patients are doing what we've asked, it makes no sense to ask them to do more.  ?

## 2021-06-29 NOTE — Assessment & Plan Note (Addendum)
- CT sinus 08/06/14 > neg  ?- Allergy profile 02/23/16  >  Eos 0.0 /  IgE  87 pos  RAST  Grass/ trees  ?- improved 05/08/2016 on gabapentin 300 tid > no change rx > d/c'd 07/2016 ?DgEs 10/09/17  Low range of penetration without aspiration. Otherwise, normal ?- Asthma exac 12/17/17- FENO 18. Significant upper airway wheeze, tx pred taper. Add neurontin 100mg  TID.  ?- Spirometry 12/28/2017  FEV1 1.01 (63%)  Ratio 86 5 h p last saba with prominent wheeze on exam   ?- 12/28/2017 rec pred 10 mg as floor but weaned off  ?- 03/11/2018 increased gabapentin to 100 mg qid and prednisone as "plan D " x 6 days and off  ?- 03/20/2018 increased gabapentin to 200 mg qid / ENT eval 04/04/18  healthy mobile cords with severe mucosal edema of the arytenoids and postcricoid region > rec max  gerd rx   ?- 04/05/2018 rechallenge with gabapentin 300  Qid  ?- PFT's  04/18/2018  FEV1 1.13 (68 % ) ratio 91  p 7 % improvement from saba p nothing prior to study with DLCO  53/60 % corrects to 147  % for alv volume  s curvature ?- 02/04/2019 informed only using gabapentin 300 mg hs so ok to d/c  ?06/23/21  Onset of symptoms reported  2012 p quit smoking  2005  ?- FEN0 01/24/2016 = 13 with active symptoms on symb 160 2bid > changed to dulera 100  ?- Spirometry 02/23/2016  NO signifiant obstruction with active symptoms  ?- 02/23/2016  After extensive coaching HFA effectiveness =    75%  ?- FENO 02/23/2016  =  12   On dulera 100 2bid (unable to confirm adeherence as did not bring it ?- Singulair 10 mg daily added 02/23/2016 to dulera 100 2bid with 2 week sample only ? (unable to verify she used it) ?- Allergy profile 02/23/16  >  Eos 0.0 /  IgE  87 pos  RAST  Grass/ trees  ?- 04/07/2016   symb 80 x one sample given ?- FENO 04/07/2016  =   43 ? Really on meds ?  ?- 05/08/2016  After extensive coaching HFA effectiveness =    75% > continue symb 80 2bid ?- 07/17/2016 added singulair maint  ? ?- FENO 09/18/2016  =   15 on symb 160 2bid > try 80 2bid  ?- flare late feb  2019 ? What maint rx she was taking but on admit "non-adherent per notes 08/12/17  ?- 09/18/2017  After extensive coaching inhaler device  effectiveness =    75% from a baseline of 50% > restart symb 80 2bid ?- FENO 09/18/2017  =   20 on symb 160 ? - Spirometry 09/18/2017  No obstruction at all with active wheeze ?-  09/09/2019 added prednisone as "plan D"  ?- 09/08/2020  After extensive coaching inhaler device,  effectiveness =    50% from baseline of 25% (delayed breath in)  ?  06/23/21 Ent eval c/w severe edema from GERD ?08/30/20 Dx EG Trace gastroesophageal reflux without evidence of ?stricture or ulceration. ?Refer to [facility] 06/29/2021 >>>  ? ?Of the three most common causes of  Sub-acute / recurrent or chronic cough, only one (GERD)  can actually contribute to/ trigger  the other two (asthma and post nasal drip syndrome)  and perpetuate the cylce of cough. ? ?While not intuitively obvious, many patients with chronic low grade reflux do not cough until there is a  primary insult that disturbs the protective epithelial barrier and exposes sensitive nerve endings.   This is typically viral but can due to PNDS and  either may apply here.   The point is that once this occurs, it is difficult to eliminate the cycle  using anything but a maximally effective acid suppression regimen at least in the short run, accompanied by an appropriate diet to address non acid GERD and control / eliminate the cough itself for at least 3 days with tyl #3 >>> also so added 6 day taper off  Prednisone starting at 40 mg per day in case of component of Th-2 driven upper or lower airways inflammation (if cough responds short term only to relapse before return while will on full rx for uacs (as above), then  that would point to allergic rhinitis/ asthma or eos bronchitis as alternative dx)  ? ? ?F/u in 4 weeks, call sooner prn but must return with all meds in hand using a trust but verify approach to confirm accurate Medication  Reconciliation The  principal here is that until we are certain that the  patients are doing what we've asked, it makes no sense to ask them to do more.  ? ?    ?  ? ?Each maintenance medication was reviewed in detail including emphasizing most importantly the difference between maintenance and prns and under what circumstances the prns are to be triggered using an action plan format where appropriate. ? ?Total time for H and P, chart review, counseling, reviewing hfa  device(s) and generating customized AVS unique to this office visit / same day charting   34 min  ?     ?

## 2021-07-04 ENCOUNTER — Encounter: Payer: Medicare Other | Admitting: Physical Therapy

## 2021-07-05 ENCOUNTER — Ambulatory Visit (INDEPENDENT_AMBULATORY_CARE_PROVIDER_SITE_OTHER): Payer: Self-pay | Admitting: Podiatry

## 2021-07-05 DIAGNOSIS — Z91199 Patient's noncompliance with other medical treatment and regimen due to unspecified reason: Secondary | ICD-10-CM

## 2021-07-05 NOTE — Progress Notes (Signed)
   Complete physical exam  Patient: [name]   DOB: 02/18/2001   71 y.o. Female  MRN: [id number]  Subjective:    No chief complaint on file.   [name] is a 71 y.o. female who presents today for a complete physical exam. She reports consuming a {diet types:17450} diet. {types:19826} She generally feels {DESC; WELL/FAIRLY WELL/POORLY:18703}. She reports sleeping {DESC; WELL/FAIRLY WELL/POORLY:18703}. She {does/does not:200015} have additional problems to discuss today.    Most recent fall risk assessment:    10/26/2021   10:42 AM  Fall Risk   Falls in the past year? 0  Number falls in past yr: 0  Injury with Fall? 0  Risk for fall due to : No Fall Risks  Follow up Falls evaluation completed     Most recent depression screenings:    10/26/2021   10:42 AM 09/16/2020   10:46 AM  PHQ 2/9 Scores  PHQ - 2 Score 0 0  PHQ- 9 Score 5     {VISON DENTAL STD PSA (Optional):27386}  {History (Optional):23778}  Patient Care Team: Jessup, Joy, NP as PCP - General (Nurse Practitioner)   Outpatient Medications Prior to Visit  Medication Sig   fluticasone (FLONASE) 50 MCG/ACT nasal spray Place 2 sprays into both nostrils in the morning and at bedtime. After 7 days, reduce to once daily.   norgestimate-ethinyl estradiol (SPRINTEC 28) 0.25-35 MG-MCG tablet Take 1 tablet by mouth daily.   Nystatin POWD Apply liberally to affected area 2 times per day   spironolactone (ALDACTONE) 100 MG tablet Take 1 tablet (100 mg total) by mouth daily.   No facility-administered medications prior to visit.    ROS        Objective:     There were no vitals taken for this visit. {Vitals History (Optional):23777}  Physical Exam   No results found for any visits on 12/01/21. {Show previous labs (optional):23779}    Assessment & Plan:    Routine Health Maintenance and Physical Exam  Immunization History  Administered Date(s) Administered   DTaP 05/04/1999, 06/30/1999,  09/08/1999, 05/24/2000, 12/08/2003   Hepatitis A 10/04/2007, 10/09/2008   Hepatitis B 02/19/1999, 03/29/1999, 09/08/1999   HiB (PRP-OMP) 05/04/1999, 06/30/1999, 09/08/1999, 05/24/2000   IPV 05/04/1999, 06/30/1999, 02/27/2000, 12/08/2003   Influenza,inj,Quad PF,6+ Mos 01/09/2014   Influenza-Unspecified 04/10/2012   MMR 02/26/2001, 12/08/2003   Meningococcal Polysaccharide 10/09/2011   Pneumococcal Conjugate-13 05/24/2000   Pneumococcal-Unspecified 09/08/1999, 11/22/1999   Tdap 10/09/2011   Varicella 02/27/2000, 10/04/2007    Health Maintenance  Topic Date Due   HIV Screening  Never done   Hepatitis C Screening  Never done   INFLUENZA VACCINE  11/29/2021   PAP-Cervical Cytology Screening  12/01/2021 (Originally 02/18/2020)   PAP SMEAR-Modifier  12/01/2021 (Originally 02/18/2020)   TETANUS/TDAP  12/01/2021 (Originally 10/08/2021)   HPV VACCINES  Discontinued   COVID-19 Vaccine  Discontinued    Discussed health benefits of physical activity, and encouraged her to engage in regular exercise appropriate for her age and condition.  Problem List Items Addressed This Visit   None Visit Diagnoses     Annual physical exam    -  Primary   Cervical cancer screening       Need for Tdap vaccination          No follow-ups on file.     Joy Jessup, NP   

## 2021-07-07 ENCOUNTER — Ambulatory Visit: Payer: Medicare Other | Attending: Gastroenterology

## 2021-07-07 ENCOUNTER — Other Ambulatory Visit: Payer: Self-pay

## 2021-07-07 DIAGNOSIS — R498 Other voice and resonance disorders: Secondary | ICD-10-CM | POA: Diagnosis present

## 2021-07-08 ENCOUNTER — Telehealth: Payer: Self-pay | Admitting: Internal Medicine

## 2021-07-08 NOTE — Telephone Encounter (Signed)
She saw Dr Constance Holster during her admit a>> no f/u needed x for Speech therapy ?

## 2021-07-08 NOTE — Patient Instructions (Signed)
Vocal cord dysfunction hand out from [place] ?

## 2021-07-08 NOTE — Telephone Encounter (Signed)
Spoke with the pt  ?She has had her first eval with speech therapy per MW recs  ?She says that they planned 7 additional sessions  ?She was referred to ENT also- not sure who referred her but she asks if MW thinks she still needs ENT eval  ?Please advise you opinion on this and I will call her back ,thanks! ?

## 2021-07-08 NOTE — Therapy (Signed)
[facility] [address], [place], [place], [address] Phone: [phone]   Fax:  [phone]  Speech Language Pathology Evaluation  Patient Details  Name: [name] MRN: [id number] Date of Birth: 11-10-1950 Referring Provider (SLP): Dr. Melvyn Novas   Encounter Date: 07/07/2021   End of Session - 07/08/21 0955     Visit Number 1    Number of Visits 13    Date for SLP Re-Evaluation 09/09/21    SLP Start Time 1619    SLP Stop Time  1700    SLP Time Calculation (min) 41 min    Activity Tolerance Patient tolerated treatment well             Past Medical History:  Diagnosis Date   Anxiety    Arthritis    Asthma    Chronic diastolic CHF (congestive heart failure) ([place]) 07/17/2016   Coronary artery disease    a. NSTEMI 12/2015 - LHC 01/18/16: s/p overlapping DESx2 to RCA, 10% ost-prox Cx,10% mLAD.   Dementia ([place])    Depression    Diabetes mellitus    Dyslipidemia    GERD (gastroesophageal reflux disease)    History of seizure    Hypertension    Hypertensive heart disease    NSTEMI (non-ST elevated myocardial infarction) ([place]) 12/2015   Stage 3 chronic kidney disease (HCC)     Past Surgical History:  Procedure Laterality Date   ABDOMINAL HYSTERECTOMY     CARDIAC CATHETERIZATION  1994   minimal LAD dz, no other dz, EF normal   CARDIAC CATHETERIZATION N/A 01/18/2016   Procedure: Left Heart Cath and Coronary Angiography;  Surgeon: Burnell Blanks, MD;  Location: [facility];  Service: Cardiovascular;  Laterality: N/A;   CARDIAC CATHETERIZATION N/A 01/18/2016   Procedure: Coronary Stent Intervention;  Surgeon: Burnell Blanks, MD;  Location: [facility];  Service: Cardiovascular;  Laterality: N/A;   COLONOSCOPY WITH PROPOFOL N/A 04/09/2014   Procedure: COLONOSCOPY WITH PROPOFOL;  Surgeon: Cleotis Nipper, MD;  Location: [facility];  Service: Endoscopy;  Laterality: N/A;   CORONARY STENT PLACEMENT   01/19/2016   STENT SYNERGY DES [id number] drug eluting stent was successfully placed, and overlaps the 2.5 x 38 mm Synergy stent placed distally.   ESOPHAGOGASTRODUODENOSCOPY (EGD) WITH PROPOFOL N/A 04/09/2014   Procedure: ESOPHAGOGASTRODUODENOSCOPY (EGD) WITH PROPOFOL;  Surgeon: Cleotis Nipper, MD;  Location: [facility];  Service: Endoscopy;  Laterality: N/A;   FOOT SURGERY Bilateral    approx ~2000   HEMORRHOID SURGERY     HERNIA REPAIR     KNEE ARTHROSCOPY     RADIOACTIVE SEED GUIDED EXCISIONAL BREAST BIOPSY Left 01/20/2021   Procedure: RADIOACTIVE SEED GUIDED EXCISIONAL LEFT BREAST BIOPSY;  Surgeon: Stark Klein, MD;  Location: [facility];  Service: General;  Laterality: Left;    There were no vitals filed for this visit.   Subjective Assessment - 07/08/21 0922     Currently in Pain? Yes    Pain Score 4     Pain Location Chest    Pain Orientation Mid    Pain Descriptors / Indicators Discomfort    Pain Type Acute pain    Pain Onset 1 to 4 weeks ago    Pain Frequency Intermittent                SLP Evaluation [facility] - 07/08/21 6767       SLP Visit Information   SLP Received On 07/07/21  Referring Provider (SLP) Dr. Melvyn Novas    Onset Date "years ago"      Subjective   Patient/Family Stated Goal Decrease coughing episodes and be able to walk down detergent aisle      General Information   HPI Pt      Prior Functional Status   Cognitive/Linguistic Baseline Within functional limits    Type of Home House      Cognition   Overall Cognitive Status Within Functional Limits for tasks assessed      Auditory Comprehension   Overall Auditory Comprehension Appears within functional limits for tasks assessed      Verbal Expression   Overall Verbal Expression Appears within functional limits for tasks assessed      Oral Motor/Sensory Function   Overall Oral Motor/Sensory Function Appears within functional limits for tasks assessed      Motor Speech   Overall Motor Speech  Impaired    Respiration Impaired    Phonation Hoarse    Phonation Impaired    Vocal Abuses Vocal Fold Dehydration;Habitual Cough/Throat Clear      Standardized Assessments   Standardized Assessments  Other Assessment   Communicaiton Participation Survey - pt scored 16/30 (higher scores suggest no change in particpating in commumication due to her disorder)                            SLP Education - 07/08/21 0954     Education Details relaxation techniques, basics of VCD, tips for reflux    Person(s) Educated Patient    Methods Explanation;Handout    Comprehension Verbalized understanding              SLP Short Term Goals - 07/08/21 1126       SLP SHORT TERM GOAL #1   Title pt will use AB in short answers (4-7 words) 80% of the time in 2 sessions    Time 4    Period Weeks    Status New    Target Date 08/05/21      SLP SHORT TERM GOAL #2   Title pt will use at least one relaxation strategy when she feels VCD triggered between 3 sessions    Time 4    Period Weeks    Status New    Target Date 08/05/21      SLP SHORT TERM GOAL #3   Title pt will tell SLP 3 relaxation techniques she can use to mitigate s/sx VCD in 3 sessions    Time 4    Period Weeks    Status New    Target Date 08/05/21              SLP Long Term Goals - 07/08/21 1127       SLP LONG TERM GOAL #1   Title pt will exhibit no more than 3 throat clears and/or coughs in 3 sessions    Time 8    Period Weeks    Status New    Target Date 09/09/21      SLP LONG TERM GOAL #2   Title pt will tell SLP she utilized relaxation techniques to quell a coughing episode/need to throat clear between 3 sessions    Time 8    Period Weeks    Status New    Target Date 09/09/21      SLP LONG TERM GOAL #3   Title pt will demo AB with 10 minutes simple conversation in 2 sessions  Time 8    Period Weeks    Status New    Target Date 09/09/21      SLP LONG TERM GOAL #4   Title pt will  indicate progress in ST by a score > 16 on Communication Participation Survey    Time 8    Period Weeks    Status New    Target Date 09/09/21              Plan - 07/08/21 0956     Clinical Impression Statement Pt presents today with overt s/sx of vocal cord dysfunction/irritable larynx/cyclical cough (VCD), including , frequent coughing/throat clearing, tightness in the throat, a feeling of choking,and mildly hoarse voice. Most significant sign and symptom of VCD today was couging and clearing the throat. The other was mild voice hoarseness. Pt's frequency of cough and throat clear today was 12 in 40 minute evaluation  As pt spoke over the course of the evaluation, the frequency of cough and throat clear increased. 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 SLP discussed he observed pt to be thoracic and abdominal breather in conversation, and thoracic breathing fosters VCD. SLP explained nature of abdominal breathing (AB) and how this mitigates s/sx of VCD in most speakers. SLP also educated pt re: how relaxation techniques can assist pt in stopping the cycle of cough/throat clearing in VCD. SLP was told that pt's visual imagery for "relax" was "fishing barefoot in the ocean".  SLP educated pt on another relaxation techniques (pt's throat widening when she feels throat tickle or feels the urge to cough beginning). Pt will benefit from skilled ST teaching pt how to use abdominal breathing in conversation, as well as training pt in how to use visual imagery for relaxation of voice tract.    Speech Therapy Frequency 2x / week    Duration 4 weeks   then x1/week x4 weeks   Treatment/Interventions  Other (comment);SLP instruction and feedback;Compensatory strategies;Patient/family education;Internal/external aids;Functional tasks   relaxation techniques   Potential to Achieve Goals Good    Consulted and Agree with Plan of Care Patient             Patient will benefit from skilled therapeutic intervention in order to improve the following deficits and impairments:   Other voice and resonance disorders    Problem List Patient Active Problem List   Diagnosis Date Noted   Obesity (BMI 30-39.9) 06/22/2021   Leukocytosis 06/22/2021   Hyponatremia 06/22/2021   Anemia of chronic disease 06/22/2021   Anxiety 06/19/2021   Biphasic stridor    Respiratory distress    Hypomagnesemia 05/13/2021   Dental caries 04/20/2021   Abnormal swallowing 03/29/2021   Acute non-ST segment elevation myocardial infarction ([place]) 03/29/2021   Chronic diarrhea 03/29/2021   Daytime somnolence 03/29/2021   Abnormal mammogram of left breast 12/13/2020   Memory loss 03/20/2019   Severe persistent asthma without complication 88/89/1694   DM2 (diabetes mellitus, type 2) ([place]) 03/15/2018   Chronic kidney disease, stage 3a ([place]) 03/15/2018   Dyspnea 08/12/2017   Macrocytic anemia 08/12/2017   Therapeutic drug monitoring 05/17/2017   Lung nodule 08/01/2016   Chronic diastolic CHF (congestive heart  failure) ([place]) 07/17/2016   Acute renal failure superimposed on stage 3b chronic kidney disease ([place]) 07/17/2016   Right leg swelling 02/27/2016   Uncontrolled type 2 diabetes mellitus with complication    Seizures ([place]) 01/27/2016   Numbness 01/27/2016   CAD in native artery 01/20/2016   Hypertensive heart disease 01/20/2016   NSTEMI (non-ST elevated myocardial infarction) ([place]) 01/18/2016   Dyspnea on exertion    Asthma exacerbation 07/21/2015   Upper airway cough syndrome with VCD 08/28/2014   Diabetes mellitus type 2, controlled ([place]) 08/06/2014   Hyperlipidemia 08/06/2014   COPD exacerbation ([place])  03/09/2012   Allergic rhinitis 07/22/2008   Vocal cord dysfunction 07/22/2008   DYSPNEA 07/22/2008   Diabetes mellitus type 2, uncontrolled 07/21/2008   DIABETIC PERIPHERAL NEUROPATHY 07/21/2008   Depression with anxiety 07/21/2008   Essential hypertension 07/21/2008   Mild persistent chronic asthma without complication 74/03/8785   GERD (gastroesophageal reflux disease) 07/21/2008   PULMONARY EMBOLISM, HX OF 07/21/2008    [place], [place] 07/08/2021, 11:33 AM  [facility] [address], [place], [place], [address] Phone: [phone]   Fax:  [phone]  Name: [name] MRN: [id number] Date of Birth: April 14, 1951

## 2021-07-11 ENCOUNTER — Other Ambulatory Visit: Payer: Self-pay

## 2021-07-11 ENCOUNTER — Ambulatory Visit: Payer: Medicare Other

## 2021-07-11 ENCOUNTER — Encounter: Payer: Medicare Other | Admitting: Physical Therapy

## 2021-07-11 DIAGNOSIS — R498 Other voice and resonance disorders: Secondary | ICD-10-CM | POA: Diagnosis not present

## 2021-07-11 NOTE — Therapy (Signed)
[facility] [address], [place], [place], [address] Phone: [phone]   Fax:  [phone]  Speech Language Pathology Treatment  Patient Details  Name: [name] MRN: [id number] Date of Birth: August 07, 1950 Referring Provider (SLP): Dr. Melvyn Novas   Encounter Date: 07/11/2021   End of Session - 07/11/21 1727     Visit Number 2    Number of Visits 13    Date for SLP Re-Evaluation 09/09/21    SLP Start Time 1622    SLP Stop Time  1700    SLP Time Calculation (min) 38 min    Activity Tolerance Patient tolerated treatment well             Past Medical History:  Diagnosis Date   Anxiety    Arthritis    Asthma    Chronic diastolic CHF (congestive heart failure) ([place]) 07/17/2016   Coronary artery disease    a. NSTEMI 12/2015 - LHC 01/18/16: s/p overlapping DESx2 to RCA, 10% ost-prox Cx,10% mLAD.   Dementia ([place])    Depression    Diabetes mellitus    Dyslipidemia    GERD (gastroesophageal reflux disease)    History of seizure    Hypertension    Hypertensive heart disease    NSTEMI (non-ST elevated myocardial infarction) ([place]) 12/2015   Stage 3 chronic kidney disease (HCC)     Past Surgical History:  Procedure Laterality Date   ABDOMINAL HYSTERECTOMY     CARDIAC CATHETERIZATION  1994   minimal LAD dz, no other dz, EF normal   CARDIAC CATHETERIZATION N/A 01/18/2016   Procedure: Left Heart Cath and Coronary Angiography;  Surgeon: Burnell Blanks, MD;  Location: [facility];  Service: Cardiovascular;  Laterality: N/A;   CARDIAC CATHETERIZATION N/A 01/18/2016   Procedure: Coronary Stent Intervention;  Surgeon: Burnell Blanks, MD;  Location: [facility];  Service: Cardiovascular;  Laterality: N/A;   COLONOSCOPY WITH PROPOFOL N/A 04/09/2014   Procedure: COLONOSCOPY WITH PROPOFOL;  Surgeon: Cleotis Nipper, MD;  Location: [facility];  Service: Endoscopy;  Laterality: N/A;   CORONARY STENT PLACEMENT   01/19/2016   STENT SYNERGY DES [id number] drug eluting stent was successfully placed, and overlaps the 2.5 x 38 mm Synergy stent placed distally.   ESOPHAGOGASTRODUODENOSCOPY (EGD) WITH PROPOFOL N/A 04/09/2014   Procedure: ESOPHAGOGASTRODUODENOSCOPY (EGD) WITH PROPOFOL;  Surgeon: Cleotis Nipper, MD;  [facility];  Service: Endoscopy;  Laterality: N/A;   FOOT SURGERY Bilateral    approx ~2000   HEMORRHOID SURGERY     HERNIA REPAIR     KNEE ARTHROSCOPY     RADIOACTIVE SEED GUIDED EXCISIONAL BREAST BIOPSY Left 01/20/2021   Procedure: RADIOACTIVE SEED GUIDED EXCISIONAL LEFT BREAST BIOPSY;  Surgeon: Stark Klein, MD;  Location: [facility];  Service: General;  Laterality: Left;    There were no vitals filed for this visit.   Subjective Assessment - 07/11/21 1627     Subjective "I cleared my throat maybe - 3-4 times since I saw you last. I did the water (to clear the phlegm)."    Currently in Pain? No/denies                   ADULT SLP TREATMENT - 07/11/21 1630       General Information   Behavior/Cognition Alert;Cooperative;Pleasant mood      Treatment Provided   Treatment provided Cognitive-Linquistic      Cognitive-Linquistic Treatment   Treatment focused on Voice  Skilled Treatment "I don't feel the tickle anymore - I do feel the mucous but it's not thick like it was before." SLP reviewed pt's goals with her and she approved of the STG and LTGs. SLP and pt in conversation about pt's ageusia - pt to see her PCP Wednesday and she will ask him. 10 minutes into this conversation (20 minutes into sesion) pt stated, "See i'm losing my voice" which is a characteristic pt provided SLP - it will become difficult for her to vocalize after ~20 minutes of talking. SLP suggested possibly due to s/sx VCD as voice use, pt's vocal folds are spasming shut when she should be breathing to talk/vocalize. SLP then taught pt abdominal breathing (AB) and she practiced at rest with 90%  success. Pt was told to do 15 mintues AB BID for homework until next session.      Assessment / Recommendations / Plan   Plan Continue with current plan of care      Progression Toward Goals   Progression toward goals Progressing toward goals              SLP Education - 07/11/21 1726     Education Details abdominal breathing, reviewed relaxation techniques    Person(s) Educated Patient    Methods Explanation;Demonstration;Verbal cues    Comprehension Verbalized understanding;Returned demonstration;Verbal cues required;Need further instruction              SLP Short Term Goals - 07/11/21 1633       SLP SHORT TERM GOAL #1   Title pt will use AB in short answers (4-7 words) 80% of the time in 2 sessions    Time 4    Period Weeks    Status On-going    Target Date 08/05/21      SLP SHORT TERM GOAL #2   Title pt will use at least one relaxation strategy when she feels VCD triggered between 3 sessions    Time 4    Period Weeks    Status On-going    Target Date 08/05/21      SLP SHORT TERM GOAL #3   Title pt will tell SLP 3 relaxation techniques she can use to mitigate s/sx VCD in 3 sessions    Time 4    Period Weeks    Status On-going    Target Date 08/05/21              SLP Long Term Goals - 07/11/21 1731       SLP LONG TERM GOAL #1   Title pt will exhibit no more than 3 throat clears and/or coughs in 3 sessions    Baseline 07-11-21    Time 8    Period Weeks    Status On-going    Target Date 09/09/21      SLP LONG TERM GOAL #2   Title pt will tell SLP she utilized relaxation techniques to quell a coughing episode/need to throat clear between 3 sessions    Time 8    Period Weeks    Status On-going    Target Date 09/09/21      SLP LONG TERM GOAL #3   Title pt will demo AB with 10 minutes simple conversation in 2 sessions    Time 8    Period Weeks    Status On-going    Target Date 09/09/21      SLP LONG TERM GOAL #4   Title pt will indicate  progress in ST by a  score > 16 on Communication Participation Survey    Time 8    Period Weeks    Status On-going    Target Date 09/09/21              Plan - 07/11/21 1727     Clinical Impression Statement [name] presented at eval with overt s/sx of vocal cord dysfunction/irritable larynx/cyclical cough (VCD), including, frequent coughing/throat clearing, tightness in the throat, a feeling of choking,and mildly hoarse voice. Pt did not cough or throat clear today.  As pt spoke over the course of the session she stated her voice got "harder to talk" which SLP suspects is a trigger for her VCD. SLP introduced abdominal breathing. SLP reiterated how relaxation techniques can assist pt in stopping the cycle of cough/throat clearing in VCD. Pt will cont to benefit from skilled ST teaching pt how to use abdominal breathing in conversation, as well as training pt in how to use visual imagery for relaxation of voice tract.    Speech Therapy Frequency 2x / week    Duration 4 weeks   then x1/week x4 weeks   Treatment/Interventions Other (comment);SLP instruction and feedback;Compensatory strategies;Patient/family education;Internal/external aids;Functional tasks   relaxation techniques   Potential to Achieve Goals Good    Consulted and Agree with Plan of Care Patient             Patient will benefit from skilled therapeutic intervention in order to improve the following deficits and impairments:   Other voice and resonance disorders    Problem List Patient Active Problem List   Diagnosis Date Noted   Obesity (BMI 30-39.9) 06/22/2021   Leukocytosis 06/22/2021   Hyponatremia 06/22/2021   Anemia of chronic disease 06/22/2021   Anxiety 06/19/2021   Biphasic stridor    Respiratory distress    Hypomagnesemia 05/13/2021   Dental caries 04/20/2021   Abnormal swallowing 03/29/2021   Acute non-ST segment elevation myocardial infarction ([place]) 03/29/2021   Chronic diarrhea 03/29/2021    Daytime somnolence 03/29/2021   Abnormal mammogram of left breast 12/13/2020   Memory loss 03/20/2019   Severe persistent asthma without complication 44/81/8563   DM2 (diabetes mellitus, type 2) ([name]) 03/15/2018   Chronic kidney disease, stage 3a ([place]) 03/15/2018   Dyspnea 08/12/2017   Macrocytic anemia 08/12/2017   Therapeutic drug monitoring 05/17/2017   Lung nodule 08/01/2016   Chronic diastolic CHF (congestive heart failure) ([place]) 07/17/2016   Acute renal failure superimposed on stage 3b chronic kidney disease ([place]) 07/17/2016   Right leg swelling 02/27/2016   Uncontrolled type 2 diabetes mellitus with complication    Seizures ([place]) 01/27/2016   Numbness 01/27/2016   CAD in native artery 01/20/2016   Hypertensive heart disease 01/20/2016   NSTEMI (non-ST elevated myocardial infarction) ([place]) 01/18/2016   Dyspnea on exertion    Asthma exacerbation 07/21/2015   Upper airway cough syndrome with VCD 08/28/2014   Diabetes mellitus type 2, controlled ([place]) 08/06/2014   Hyperlipidemia 08/06/2014   COPD exacerbation ([place]) 03/09/2012   Allergic rhinitis 07/22/2008   Vocal cord dysfunction 07/22/2008   DYSPNEA 07/22/2008   Diabetes mellitus type 2, uncontrolled 07/21/2008   DIABETIC PERIPHERAL NEUROPATHY 07/21/2008   Depression with anxiety 07/21/2008   Essential hypertension 07/21/2008   Mild persistent chronic asthma without complication 14/97/0263   GERD (gastroesophageal reflux disease) 07/21/2008   PULMONARY EMBOLISM, HX OF 07/21/2008    Crowder, [place] 07/11/2021, 5:32 PM  [facility] [address], [place], [place],  [address] Phone: [phone]   Fax:  [phone]   Name: [name] MRN: [id number] Date of Birth: [birth year]

## 2021-07-11 NOTE — Telephone Encounter (Signed)
Spoke with the pt and notified of response per Dr Melvyn Novas and she verbalized understanding. Nothing further needed.  ?

## 2021-07-13 NOTE — Progress Notes (Signed)
?Cardiology Office Note:   ? ?Date:  07/14/2021  ? ?ID:  [name], DOB Sep 12, 1950, MRN [id number] ? ?PCP:  [name], MD ?  ?[place]  ?Cardiologist:  Candee Furbish, MD  ?Advanced Practice Provider:  No care team member to display ?Electrophysiologist:  None  ?    ? ?Referring MD: [name], MD  ? ? ?History of Present Illness:   ? ?[name] is a 71 y.o. female here for the follow-up of coronary disease, prior non-ST elevation myocardial infarction, PE, stage IIIa chronic kidney disease, hypertension.  She has been watched closely by the hypertension clinic.  Challenging blood pressures.  See below for details, ? ?Previously tried: Irbesartan, valsartan, valsartan/HCTZ, (thought to have stopped ACE and ARB due to cough and asthma symptoms) metoprolol succinate, amlodipine previously on allergy list (unsure why, but maybe swelling) ?  ?Blood pressure continues to still be high.  At hypertension visit she was asked to start chlorthalidone but she was hesitant.  She is also hesitant to retry ACE inhibitor's or ARBs because of the cough.  They did try to start chlorthalidone 12.5 mg a day. ?  ?She revisited with him and it was determined to continue with  ?chlorthalidone 12.5 ?Hydralazine 25 3 times a day ?Nebivolol 10 mg a day ?Diltiazem 240 mg a day ?Torsemide 20 mg a day (stopped) too much hypokalemia ?Off ASA with GERD improved.  ?  ?She presented to the ED 06/19/21 for respiratory distress. Her symptoms started 3 days prior. She started taking nebulizer and steroids at home with no improvement. ENT saw no anatomical issue and recommended treatment for chronic reflux. She was feeling better but still wheezing. She was discharged on oral prednisone, Protonix, Carafate, antihistamines and as needed Maalox. ? ?Today, she is doing well. From December to February, she was in the hospital 6 times. Acid reflux aggravated her asthma. Afterwards, she had a respiratory infection.  She was taken off of Protonix due to her kidney function and given an alternative medication. She is still trying to recover.  ? ?Before her first hospitalization in December, she was packing for a trip to [place] when she suddenly developed a cough. Her husband recommended she go to the emergency room. She had to stay in the hospital for 9 days.  ? ?Her blood pressure this morning was 155/90. She has noticed her blood pressure and blood sugar is highest in the morning.  ? ?She denies any palpitations, chest pain, lightheadedness, headaches, syncope, orthopnea, PND, or lower extremity edema. ? ?Past Medical History:  ?Diagnosis Date  ? Anxiety   ? Arthritis   ? Asthma   ? Chronic diastolic CHF (congestive heart failure) ([place]) 07/17/2016  ? Coronary artery disease   ? a. NSTEMI 12/2015 - LHC 01/18/16: s/p overlapping DESx2 to RCA, 10% ost-prox Cx,10% mLAD.  ? Dementia ([place])   ? Depression   ? Diabetes mellitus   ? Dyslipidemia   ? GERD (gastroesophageal reflux disease)   ? History of seizure   ? Hypertension   ? Hypertensive heart disease   ? NSTEMI (non-ST elevated myocardial infarction) ([place]) 12/2015  ? Stage 3 chronic kidney disease ([place])   ? ? ?Past Surgical History:  ?Procedure Laterality Date  ? ABDOMINAL HYSTERECTOMY    ? CARDIAC CATHETERIZATION  1994  ? minimal LAD dz, no other dz, EF normal  ? CARDIAC CATHETERIZATION N/A 01/18/2016  ? Procedure: Left Heart Cath and Coronary Angiography;  Surgeon: Harrell Gave  Santina Evans, MD;  Location: [facility];  Service: Cardiovascular;  Laterality: N/A;  ? CARDIAC CATHETERIZATION N/A 01/18/2016  ? Procedure: Coronary Stent Intervention;  Surgeon: Burnell Blanks, MD;  Location: [facility];  Service: Cardiovascular;  Laterality: N/A;  ? COLONOSCOPY WITH PROPOFOL N/A 04/09/2014  ? Procedure: COLONOSCOPY WITH PROPOFOL;  Surgeon: Cleotis Nipper, MD;  [facility];  Service: Endoscopy;  Laterality: N/A;  ? CORONARY STENT PLACEMENT   01/19/2016  ? STENT SYNERGY DES [id number] drug eluting stent was successfully placed, and overlaps the 2.5 x 38 mm Synergy stent placed distally.  ? ESOPHAGOGASTRODUODENOSCOPY (EGD) WITH PROPOFOL N/A 04/09/2014  ? Procedure: ESOPHAGOGASTRODUODENOSCOPY (EGD) WITH PROPOFOL;  Surgeon: Cleotis Nipper, MD;  [facility];  Service: Endoscopy;  Laterality: N/A;  ? FOOT SURGERY Bilateral   ? approx ~2000  ? HEMORRHOID SURGERY    ? HERNIA REPAIR    ? KNEE ARTHROSCOPY    ? RADIOACTIVE SEED GUIDED EXCISIONAL BREAST BIOPSY Left 01/20/2021  ? Procedure: RADIOACTIVE SEED GUIDED EXCISIONAL LEFT BREAST BIOPSY;  Surgeon: Stark Klein, MD;  Location: [facility];  Service: General;  Laterality: Left;  ? ? ?Current Medications: ?Current Meds  ?Medication Sig  ? albuterol (PROAIR HFA) 108 (90 Base) MCG/ACT inhaler Inhale 2 puffs into the lungs every 4 (four) hours as needed for wheezing or shortness of breath.  ? albuterol (PROVENTIL) (2.5 MG/3ML) 0.083% nebulizer solution Take 3 mLs (2.5 mg total) by nebulization every 4 (four) hours as needed for wheezing or shortness of breath.  ? ALPRAZolam (XANAX) 0.5 MG tablet Take 1 tablet (0.5 mg total) by mouth 2 (two) times daily as needed for anxiety.  ? atorvastatin (LIPITOR) 40 MG tablet Take 40 mg by mouth at bedtime.  ? Biotin 5000 MCG CAPS Take 5,000 mcg by mouth daily.  ? budesonide (PULMICORT) 0.5 MG/2ML nebulizer solution USE 1 VIAL IN NEBULIZER IN THE MORNING AND AT BEDTIME  ? diltiazem (CARDIZEM CD) 240 MG 24 hr capsule Take 1 capsule (240 mg total) by mouth daily.  ? divalproex (DEPAKOTE ER) 500 MG 24 hr tablet Take 1 tablet (500 mg total) by mouth at bedtime.  ? formoterol (PERFOROMIST) 20 MCG/2ML nebulizer solution USE 1 VIAL  IN  NEBULIZER TWICE  DAILY - morning and evening  ? glipiZIDE (GLUCOTROL) 5 MG tablet Take 5 mg by mouth daily before breakfast.  ? hydrALAZINE (APRESOLINE) 100 MG tablet Take 1 tablet (100 mg total) by mouth 3 (three) times daily. Please make yearly  appt with Dr. Marlou Porch for April 2023 for future refills. Thank you 1st attempt  ? latanoprost (XALATAN) 0.005 % ophthalmic solution Place 1 drop into both eyes at bedtime.  ? magnesium oxide (MAG-OX) 400 (240 Mg) MG tablet Take 1 tablet (400 mg total) by mouth daily.  ? memantine (NAMENDA) 10 MG tablet Take 1 tablet (10 mg total) by mouth 2 (two) times daily.  ? metFORMIN (GLUCOPHAGE) 1000 MG tablet Take 1,000 mg by mouth 2 (two) times daily with a meal.  ? mometasone-formoterol (DULERA) 100-5 MCG/ACT AERO Inhale 2 puffs into the lungs 2 (two) times daily.  ? montelukast (SINGULAIR) 10 MG tablet Take 1 tablet (10 mg total) by mouth at bedtime.  ? Multiple Vitamin (MULTIVITAMIN WITH MINERALS) TABS tablet Take 1 tablet by mouth daily with breakfast.  ? nebivolol (BYSTOLIC) 10 MG tablet Take 1 tablet (10 mg total) by mouth daily.  ? nitroGLYCERIN (NITROSTAT) 0.4 MG SL tablet Place 1 tablet (0.4 mg  total) under the tongue every 5 (five) minutes as needed for chest pain. Don't exceed more than 3 doses in 15 mins  ? Probiotic Product (EQ PROBIOTIC) CAPS Take 1 capsule by mouth every morning.  ?  ? ?Allergies:   Chlorthalidone and Tramadol  ? ?Social History  ? ?Socioeconomic History  ? Marital status: Married  ?  Spouse name: Not on file  ? Number of children: 3  ? Years of education: some college  ? Highest education level: Not on file  ?Occupational History  ? Occupation: Nutritional therapist  ?Tobacco Use  ? Smoking status: Former  ?  Packs/day: 1.00  ?  Years: 30.00  ?  Pack years: 30.00  ?  Types: Cigarettes  ?  Quit date: 01/09/2004  ?  Years since quitting: 17.5  ? Smokeless tobacco: Never  ?Vaping Use  ? Vaping Use: Never used  ?Substance and Sexual Activity  ? Alcohol use: No  ?  Alcohol/week: 0.0 standard drinks  ? Drug use: No  ? Sexual activity: Not on file  ?Other Topics Concern  ? Not on file  ?Social History Narrative  ? Pt lives with husband in [place].  ? Right-handed.  ? 2 cups caffeine per day.  ? ?Social  Determinants of Health  ? ?Financial Resource Strain: Not on file  ?Food Insecurity: Not on file  ?Transportation Needs: Not on file  ?Physical Activity: Not on file  ?Stress: Not on file  ?Social Connections: N

## 2021-07-14 ENCOUNTER — Encounter: Payer: Self-pay | Admitting: Cardiology

## 2021-07-14 ENCOUNTER — Other Ambulatory Visit: Payer: Self-pay

## 2021-07-14 ENCOUNTER — Ambulatory Visit (INDEPENDENT_AMBULATORY_CARE_PROVIDER_SITE_OTHER): Payer: Medicare Other | Admitting: Cardiology

## 2021-07-14 DIAGNOSIS — I10 Essential (primary) hypertension: Secondary | ICD-10-CM | POA: Diagnosis not present

## 2021-07-14 DIAGNOSIS — N1831 Chronic kidney disease, stage 3a: Secondary | ICD-10-CM | POA: Diagnosis not present

## 2021-07-14 DIAGNOSIS — I2510 Atherosclerotic heart disease of native coronary artery without angina pectoris: Secondary | ICD-10-CM | POA: Diagnosis not present

## 2021-07-14 DIAGNOSIS — J4550 Severe persistent asthma, uncomplicated: Secondary | ICD-10-CM | POA: Diagnosis not present

## 2021-07-14 NOTE — Assessment & Plan Note (Signed)
Was hospitalized several times earlier this year.  Discharge summaries reviewed, consultants reviewed.  She is on PPI.  Asthma medications. ?

## 2021-07-14 NOTE — Assessment & Plan Note (Signed)
Had prior myocardial infarction in 2017 with 2 stents placed in the RCA.  She is not having any anginal symptoms.  Continue with goal-directed medical management including Bystolic and statin.  Aggressive secondary prevention. ?

## 2021-07-14 NOTE — Assessment & Plan Note (Signed)
Continue with current medication strategy.  Medicines reviewed as above.  No changes made.  Appreciate hypertension clinic assistance. ?

## 2021-07-14 NOTE — Assessment & Plan Note (Signed)
Avoid NSAIDs.  Continue to monitor. ?

## 2021-07-14 NOTE — Patient Instructions (Signed)
Medication Instructions: ?The current medical regimen is effective;  continue present plan and medications. ? ?*If you need a refill on your cardiac medications before your next appointment, please call your pharmacy* ? ?Follow-Up: ?At [facility], you and your health needs are our priority.  As part of our continuing mission to provide you with exceptional heart care, we have created designated Provider Care Teams.  These Care Teams include your primary Cardiologist (physician) and Advanced Practice Providers (APPs -  Physician Assistants and Nurse Practitioners) who all work together to provide you with the care you need, when you need it. ? ?We recommend signing up for the patient portal called "MyChart".  Sign up information is provided on this After Visit Summary.  MyChart is used to connect with patients for Virtual Visits (Telemedicine).  Patients are able to view lab/test results, encounter notes, upcoming appointments, etc.  Non-urgent messages can be sent to your provider as well.   ?To learn more about what you can do with MyChart, go to [email or web address].   ? ?Your next appointment:   ?6 month(s) ? ?The format for your next appointment:   ?In Person ? ?Provider:   ?Nicholes Rough, PA-C, Melina Copa, PA-C, Ermalinda Barrios, PA-C, Christen Bame, NP, or [name] Dopp, PA-C       ? ?Thank you for choosing [facility]!! ? ? ? ?

## 2021-07-20 ENCOUNTER — Ambulatory Visit: Payer: Medicare Other

## 2021-07-20 ENCOUNTER — Other Ambulatory Visit: Payer: Self-pay

## 2021-07-20 DIAGNOSIS — R498 Other voice and resonance disorders: Secondary | ICD-10-CM

## 2021-07-20 NOTE — Therapy (Signed)
[place] [facility] ?[address], [address] ?[place], [place], [address] ?Phone: [phone]   Fax:  [phone] ? ?Speech Language Pathology Treatment ? ?Patient Details  ?Name: [name] ?MRN: [id number] ?Date of Birth: [birth year] ?Referring Provider (SLP): Dr. Melvyn Novas ? ? ?Encounter Date: 07/20/2021 ? ? End of Session - 07/20/21 1359   ? ? Visit Number 3   ? Number of Visits 13   ? Date for SLP Re-Evaluation 09/09/21   ? SLP Start Time 1320   ? SLP Stop Time  1350   ? SLP Time Calculation (min) 30 min   ? Activity Tolerance Patient tolerated treatment well   ? ?  ?  ? ?  ? ? ?Past Medical History:  ?Diagnosis Date  ? Anxiety   ? Arthritis   ? Asthma   ? Chronic diastolic CHF (congestive heart failure) ([place]) 07/17/2016  ? Coronary artery disease   ? a. NSTEMI 12/2015 - LHC 01/18/16: s/p overlapping DESx2 to RCA, 10% ost-prox Cx,10% mLAD.  ? Dementia ([place])   ? Depression   ? Diabetes mellitus   ? Dyslipidemia   ? GERD (gastroesophageal reflux disease)   ? History of seizure   ? Hypertension   ? Hypertensive heart disease   ? NSTEMI (non-ST elevated myocardial infarction) ([place]) 12/2015  ? Stage 3 chronic kidney disease ([place])   ? ? ?Past Surgical History:  ?Procedure Laterality Date  ? ABDOMINAL HYSTERECTOMY    ? CARDIAC CATHETERIZATION  1994  ? minimal LAD dz, no other dz, EF normal  ? CARDIAC CATHETERIZATION N/A 01/18/2016  ? Procedure: Left Heart Cath and Coronary Angiography;  Surgeon: Burnell Blanks, MD;  Location: [place] [facility];  Service: Cardiovascular;  Laterality: N/A;  ? CARDIAC CATHETERIZATION N/A 01/18/2016  ? Procedure: Coronary Stent Intervention;  Surgeon: Burnell Blanks, MD;  Location: [place] [facility] [facility];  Service: Cardiovascular;  Laterality: N/A;  ? COLONOSCOPY WITH PROPOFOL N/A 04/09/2014  ? Procedure: COLONOSCOPY WITH PROPOFOL;  Surgeon: Cleotis Nipper, MD;  [facility];  Service: Endoscopy;  Laterality: N/A;  ? CORONARY STENT PLACEMENT   01/19/2016  ? STENT SYNERGY DES 2.75X32 drug eluting stent was successfully placed, and overlaps the 2.5 x 38 mm Synergy stent placed distally.  ? ESOPHAGOGASTRODUODENOSCOPY (EGD) WITH PROPOFOL N/A 04/09/2014  ? Procedure: ESOPHAGOGASTRODUODENOSCOPY (EGD) WITH PROPOFOL;  Surgeon: Cleotis Nipper, MD;  [facility];  Service: Endoscopy;  Laterality: N/A;  ? FOOT SURGERY Bilateral   ? approx ~2000  ? HEMORRHOID SURGERY    ? HERNIA REPAIR    ? KNEE ARTHROSCOPY    ? RADIOACTIVE SEED GUIDED EXCISIONAL BREAST BIOPSY Left 01/20/2021  ? Procedure: RADIOACTIVE SEED GUIDED EXCISIONAL LEFT BREAST BIOPSY;  Surgeon: Stark Klein, MD;  Location: [facility];  Service: General;  Laterality: Left;  ? ? ?There were no vitals filed for this visit. ? ? Subjective Assessment - 07/20/21 1324   ? ? Subjective "Been doing my little stomcah breathing exercises."   ? Currently in Pain? No/denies   ? ?  ?  ? ?  ? ? ? ? ? ? ? ? ADULT SLP TREATMENT - 07/20/21 1325   ? ?  ? General Information  ? Behavior/Cognition Alert;Cooperative;Pleasant mood   ?  ? Treatment Provided  ? Treatment provided Cognitive-Linquistic   ?  ? Cognitive-Linquistic Treatment  ? Treatment focused on Voice   ? Skilled Treatment Pt does not report a coughing episode since the last  session. she has been completing abdominal breathing (AB) each day. Pt enters today using AB at rest without SLP cues. In 25 minutes conversation pt used AB 90% of the time. No hoarseness/harshness with pt's voice.   ?  ? Assessment / Recommendations / Plan  ? Plan --   decr frequency to once/week due to progress  ?  ? Progression Toward Goals  ? Progression toward goals Progressing toward goals   ? ?  ?  ? ?  ? ? ? ? ? SLP Short Term Goals - 07/20/21 1400   ? ?  ? SLP SHORT TERM GOAL #1  ? Title pt will use AB in short answers (4-7 words) 80% of the time in 2 sessions   ? Status Achieved   ? Target Date 08/05/21   ?  ? SLP SHORT TERM GOAL #2  ? Title pt will use at least one relaxation  strategy when she feels VCD triggered between 3 sessions   ? Time 4   ? Period Weeks   ? Status On-going   ? Target Date 08/05/21   ?  ? SLP SHORT TERM GOAL #3  ? Title pt will tell SLP 3 relaxation techniques she can use to mitigate s/sx VCD in 3 sessions   ? Time 4   ? Period Weeks   ? Status On-going   ? Target Date 08/05/21   ? ?  ?  ? ?  ? ? ? SLP Long Term Goals - 07/20/21 1401   ? ?  ? SLP LONG TERM GOAL #1  ? Title pt will exhibit no more than 3 throat clears and/or coughs in 3 sessions   ? Baseline 07-11-21, 07-20-21   ? Time 8   ? Period Weeks   ? Status On-going   ? Target Date 09/09/21   ?  ? SLP LONG TERM GOAL #2  ? Title pt will tell SLP she utilized relaxation techniques to quell a coughing episode/need to throat clear between 3 sessions   ? Time 8   ? Period Weeks   ? Status On-going   ? Target Date 09/09/21   ?  ? SLP LONG TERM GOAL #3  ? Title pt will demo AB with 10 minutes simple conversation in 2 sessions   ? Baseline 07-20-21   ? Time 8   ? Period Weeks   ? Status On-going   ? Target Date 09/09/21   ?  ? SLP LONG TERM GOAL #4  ? Title pt will indicate progress in ST by a score > 16 on Communication Participation Survey   ? Time 8   ? Period Weeks   ? Status On-going   ? Target Date 09/09/21   ? ?  ?  ? ?  ? ? ? Plan - 07/20/21 1400   ? ? Clinical Impression Statement Riely presented at eval with overt s/sx of vocal cord dysfunction/irritable larynx/cyclical cough (VCD), including, frequent coughing/throat clearing, tightness in the throat, a feeling of choking,and mildly hoarse voice. Pt did not cough or throat clear today.Pt used abdominal breathing throughout session and in 25 minutes conversation. Decr to once/week due to progress. Pt will cont to benefit from skilled ST teaching pt how to use abdominal breathing in conversation, as well as training pt in how to use visual imagery for relaxation of voice tract.   ? Speech Therapy Frequency 1x /week   ? Duration 4 weeks   then x1/week x4 weeks   ?  Treatment/Interventions Other (comment);SLP instruction and feedback;Compensatory strategies;Patient/family education;Internal/external aids;Functional tasks   relaxation techniques  ? Potential to Achieve Goals Good   ? Consulted and Agree with Plan of Care Patient   ? ?  ?  ? ?  ? ? ?Patient will benefit from skilled therapeutic intervention in order to improve the following deficits and impairments:   ?Other voice and resonance disorders ? ? ? ?Problem List ?Patient Active Problem List  ? Diagnosis Date Noted  ? Obesity (BMI 30-39.9) 06/22/2021  ? Leukocytosis 06/22/2021  ? Hyponatremia 06/22/2021  ? Anemia of chronic disease 06/22/2021  ? Anxiety 06/19/2021  ? Biphasic stridor   ? Respiratory distress   ? Hypomagnesemia 05/13/2021  ? Dental caries 04/20/2021  ? Abnormal swallowing 03/29/2021  ? Acute non-ST segment elevation myocardial infarction [facility]) 03/29/2021  ? Chronic diarrhea 03/29/2021  ? Daytime somnolence 03/29/2021  ? Abnormal mammogram of left breast 12/13/2020  ? Memory loss 03/20/2019  ? Severe persistent asthma without complication 69/45/0388  ? DM2 (diabetes mellitus, type 2) ([place]) 03/15/2018  ? Chronic kidney disease, stage 3a ([place]) 03/15/2018  ? Dyspnea 08/12/2017  ? Macrocytic anemia 08/12/2017  ? Therapeutic drug monitoring 05/17/2017  ? Lung nodule 08/01/2016  ? Chronic diastolic CHF (congestive heart failure) ([place]) 07/17/2016  ? Acute renal failure superimposed on stage 3b chronic kidney disease ([place]) 07/17/2016  ? Right leg swelling 02/27/2016  ? Uncontrolled type 2 diabetes mellitus with complication   ? Seizures ([place]) 01/27/2016  ? Numbness 01/27/2016  ? CAD in native artery 01/20/2016  ? Hypertensive heart disease 01/20/2016  ? NSTEMI (non-ST elevated myocardial infarction) ([place]) 01/18/2016  ? Dyspnea on exertion   ? Asthma exacerbation 07/21/2015  ? Upper airway cough syndrome with VCD 08/28/2014  ? Diabetes mellitus type 2, controlled ([place]) 08/06/2014  ? Hyperlipidemia 08/06/2014   ? COPD exacerbation ([place]) 03/09/2012  ? Allergic rhinitis 07/22/2008  ? Vocal cord dysfunction 07/22/2008  ? DYSPNEA 07/22/2008  ? Diabetes mellitus type 2, uncontrolled 07/21/2008  ? DIABETIC PERIPHER

## 2021-07-26 ENCOUNTER — Other Ambulatory Visit: Payer: Self-pay | Admitting: Cardiology

## 2021-07-27 ENCOUNTER — Other Ambulatory Visit: Payer: Self-pay | Admitting: Cardiology

## 2021-07-27 ENCOUNTER — Ambulatory Visit: Payer: Medicare Other

## 2021-07-27 DIAGNOSIS — R498 Other voice and resonance disorders: Secondary | ICD-10-CM

## 2021-07-27 NOTE — Therapy (Signed)
[place] ?[facility] ?[address], [address] ?[place], [place], [address] ?Phone: [phone]   Fax:  [phone] ? ?Speech Language Pathology Treatment ? ?Patient Details  ?Name: [name] ?MRN: [id number] ?Date of Birth: Nov 18, 1950 ?Referring Provider (SLP): Dr. Melvyn Novas ? ? ?Encounter Date: 07/27/2021 ? ? End of Session - 07/27/21 1355   ? ? Visit Number 4   ? Number of Visits 13   ? Date for SLP Re-Evaluation 09/09/21   ? SLP Start Time 4580   ? SLP Stop Time  1353   ? SLP Time Calculation (min) 32 min   ? Activity Tolerance Patient tolerated treatment well   ? ?  ?  ? ?  ? ? ?Past Medical History:  ?Diagnosis Date  ? Anxiety   ? Arthritis   ? Asthma   ? Chronic diastolic CHF (congestive heart failure) ([place]) 07/17/2016  ? Coronary artery disease   ? a. NSTEMI 12/2015 - LHC 01/18/16: s/p overlapping DESx2 to RCA, 10% ost-prox Cx,10% mLAD.  ? Dementia ([place])   ? Depression   ? Diabetes mellitus   ? Dyslipidemia   ? GERD (gastroesophageal reflux disease)   ? History of seizure   ? Hypertension   ? Hypertensive heart disease   ? NSTEMI (non-ST elevated myocardial infarction) ([place]) 12/2015  ? Stage 3 chronic kidney disease ([place])   ? ? ?Past Surgical History:  ?Procedure Laterality Date  ? ABDOMINAL HYSTERECTOMY    ? CARDIAC CATHETERIZATION  1994  ? minimal LAD dz, no other dz, EF normal  ? CARDIAC CATHETERIZATION N/A 01/18/2016  ? Procedure: Left Heart Cath and Coronary Angiography;  Surgeon: Burnell Blanks, MD;  Location: [place] [facility];  Service: Cardiovascular;  Laterality: N/A;  ? CARDIAC CATHETERIZATION N/A 01/18/2016  ? Procedure: Coronary Stent Intervention;  Surgeon: Burnell Blanks, MD;  Location: [facility];  Service: Cardiovascular;  Laterality: N/A;  ? COLONOSCOPY WITH PROPOFOL N/A 04/09/2014  ? Procedure: COLONOSCOPY WITH PROPOFOL;  Surgeon: Cleotis Nipper, MD;  [facility];  Service: Endoscopy;  Laterality: N/A;  ? CORONARY STENT PLACEMENT   01/19/2016  ? STENT SYNERGY DES 2.75X32 drug eluting stent was successfully placed, and overlaps the 2.5 x 38 mm Synergy stent placed distally.  ? ESOPHAGOGASTRODUODENOSCOPY (EGD) WITH PROPOFOL N/A 04/09/2014  ? Procedure: ESOPHAGOGASTRODUODENOSCOPY (EGD) WITH PROPOFOL;  Surgeon: Cleotis Nipper, MD;  [facility];  Service: Endoscopy;  Laterality: N/A;  ? FOOT SURGERY Bilateral   ? approx ~2000  ? HEMORRHOID SURGERY    ? HERNIA REPAIR    ? KNEE ARTHROSCOPY    ? RADIOACTIVE SEED GUIDED EXCISIONAL BREAST BIOPSY Left 01/20/2021  ? Procedure: RADIOACTIVE SEED GUIDED EXCISIONAL LEFT BREAST BIOPSY;  Surgeon: Stark Klein, MD;  Location: [place];  Service: General;  Laterality: Left;  ? ? ?There were no vitals filed for this visit. ? ? ? ? ? ? ? ? ? ADULT SLP TREATMENT - 07/27/21 1325   ? ?  ? General Information  ? Behavior/Cognition Alert;Cooperative;Pleasant mood   ?  ? Treatment Provided  ? Treatment provided Cognitive-Linquistic   ?  ? Cognitive-Linquistic Treatment  ? Treatment focused on Voice   ? Skilled Treatment Pt walked down to a neighbor's house at the end of the street that she has not been able to go to for a while due to VCD - pt visited her yesterday. SLP engaged pt in 25 minutes conversation with 85% use of abdominal  breathing (AB). Two throat clears in 40 minuutes.   ?  ? Assessment / Recommendations / Plan  ? Plan Continue with current plan of care;Other (Comment)   decr to once a week  ?  ? Progression Toward Goals  ? Progression toward goals Progressing toward goals   ? ?  ?  ? ?  ? ? ? ? ? SLP Short Term Goals - 07/27/21 1356   ? ?  ? SLP SHORT TERM GOAL #1  ? Title pt will use AB in short answers (4-7 words) 80% of the time in 2 sessions   ? Status Achieved   ? Target Date 08/05/21   ?  ? SLP SHORT TERM GOAL #2  ? Title pt will use at least one relaxation strategy when she feels VCD triggered between 3 sessions   ? Time 4   ? Period Weeks   ? Status On-going   ? Target Date 08/05/21   ?   ? SLP SHORT TERM GOAL #3  ? Title pt will tell SLP 3 relaxation techniques she can use to mitigate s/sx VCD in 3 sessions   ? Baseline 07-26-21   ? Time 4   ? Period Weeks   ? Status On-going   ? Target Date 08/05/21   ? ?  ?  ? ?  ? ? ? SLP Long Term Goals - 07/27/21 1356   ? ?  ? SLP LONG TERM GOAL #1  ? Title pt will exhibit no more than 3 throat clears and/or coughs in 3 sessions   ? Baseline 07-11-21, 07-20-21   ? Status Achieved   ? Target Date 09/09/21   ?  ? SLP LONG TERM GOAL #2  ? Title pt will tell SLP she utilized relaxation techniques to quell a coughing episode/need to throat clear between 3 sessions   ? Time 8   ? Period Weeks   ? Status On-going   ? Target Date 09/09/21   ?  ? SLP LONG TERM GOAL #3  ? Title pt will demo AB with 10 minutes simple conversation in 2 sessions   ? Baseline 07-20-21   ? Status Achieved   ? Target Date 09/09/21   ?  ? SLP LONG TERM GOAL #4  ? Title pt will indicate progress in ST by a score > 16 on Communication Participation Survey   ? Time 8   ? Period Weeks   ? Status On-going   ? Target Date 09/09/21   ?  ? SLP LONG TERM GOAL #5  ? Title Pt will complete 20 minutes of mod complex/complex conversation using AB 80% of the time in 3 sessions   ? Baseline 07-26-21   ? Status New   ? Target Date 09/09/21   ? ?  ?  ? ?  ? ? ? Plan - 07/27/21 1355   ? ? Clinical Impression Statement [place] presented at eval with overt s/sx of vocal cord dysfunction/irritable larynx/cyclical cough (VCD), including, frequent coughing/throat clearing, tightness in the throat, a feeling of choking,and mildly hoarse voice. .Pt used abdominal breathing throughout session and in 25 minutes conversation. Pt will cont to benefit from skilled ST teaching pt how to use abdominal breathing in conversation, as well as training pt in how to use visual imagery for relaxation of voice tract.   ? Speech Therapy Frequency 1x /week   ? Duration 4 weeks   then x1/week x4 weeks  ? Treatment/Interventions Other  (comment);SLP instruction and  feedback;Compensatory strategies;Patient/family education;Internal/external aids;Functional tasks   relaxation techniques  ? Potential to Achieve Goals Good   ? Consulted and Agree with Plan of Care Patient   ? ?  ?  ? ?  ? ? ?Patient will benefit from skilled therapeutic intervention in order to improve the following deficits and impairments:   ?Other voice and resonance disorders ? ? ? ?Problem List ?Patient Active Problem List  ? Diagnosis Date Noted  ? Obesity (BMI 30-39.9) 06/22/2021  ? Leukocytosis 06/22/2021  ? Hyponatremia 06/22/2021  ? Anemia of chronic disease 06/22/2021  ? Anxiety 06/19/2021  ? Biphasic stridor   ? Respiratory distress   ? Hypomagnesemia 05/13/2021  ? Dental caries 04/20/2021  ? Abnormal swallowing 03/29/2021  ? Acute non-ST segment elevation myocardial infarction [facility]) 03/29/2021  ? Chronic diarrhea 03/29/2021  ? Daytime somnolence 03/29/2021  ? Abnormal mammogram of left breast 12/13/2020  ? Memory loss 03/20/2019  ? Severe persistent asthma without complication 16/96/7893  ? DM2 (diabetes mellitus, type 2) ([place]) 03/15/2018  ? Chronic kidney disease, stage 3a ([place]) 03/15/2018  ? Dyspnea 08/12/2017  ? Macrocytic anemia 08/12/2017  ? Therapeutic drug monitoring 05/17/2017  ? Lung nodule 08/01/2016  ? Chronic diastolic CHF (congestive heart failure) ([place]) 07/17/2016  ? Acute renal failure superimposed on stage 3b chronic kidney disease ([place]) 07/17/2016  ? Right leg swelling 02/27/2016  ? Uncontrolled type 2 diabetes mellitus with complication   ? Seizures ([place]) 01/27/2016  ? Numbness 01/27/2016  ? CAD in native artery 01/20/2016  ? Hypertensive heart disease 01/20/2016  ? NSTEMI (non-ST elevated myocardial infarction) ([place]) 01/18/2016  ? Dyspnea on exertion   ? Asthma exacerbation 07/21/2015  ? Upper airway cough syndrome with VCD 08/28/2014  ? Diabetes mellitus type 2, controlled ([place]) 08/06/2014  ? Hyperlipidemia 08/06/2014  ? COPD exacerbation ([place])  03/09/2012  ? Allergic rhinitis 07/22/2008  ? Vocal cord dysfunction 07/22/2008  ? DYSPNEA 07/22/2008  ? Diabetes mellitus type 2, uncontrolled 07/21/2008  ? DIABETIC PERIPHERAL NEUROPATHY 07/21/2008  ? Depression w

## 2021-08-01 ENCOUNTER — Encounter: Payer: Self-pay | Admitting: Podiatry

## 2021-08-01 ENCOUNTER — Ambulatory Visit: Payer: Medicare Other

## 2021-08-01 ENCOUNTER — Ambulatory Visit (INDEPENDENT_AMBULATORY_CARE_PROVIDER_SITE_OTHER): Payer: Medicare Other | Admitting: Podiatry

## 2021-08-01 DIAGNOSIS — M79675 Pain in left toe(s): Secondary | ICD-10-CM

## 2021-08-01 DIAGNOSIS — M79674 Pain in right toe(s): Secondary | ICD-10-CM | POA: Diagnosis not present

## 2021-08-01 DIAGNOSIS — B351 Tinea unguium: Secondary | ICD-10-CM | POA: Diagnosis not present

## 2021-08-01 DIAGNOSIS — E119 Type 2 diabetes mellitus without complications: Secondary | ICD-10-CM

## 2021-08-01 DIAGNOSIS — F329 Major depressive disorder, single episode, unspecified: Secondary | ICD-10-CM | POA: Insufficient documentation

## 2021-08-01 DIAGNOSIS — G4700 Insomnia, unspecified: Secondary | ICD-10-CM | POA: Insufficient documentation

## 2021-08-01 DIAGNOSIS — E1142 Type 2 diabetes mellitus with diabetic polyneuropathy: Secondary | ICD-10-CM | POA: Insufficient documentation

## 2021-08-01 DIAGNOSIS — B353 Tinea pedis: Secondary | ICD-10-CM | POA: Diagnosis not present

## 2021-08-01 DIAGNOSIS — E559 Vitamin D deficiency, unspecified: Secondary | ICD-10-CM | POA: Insufficient documentation

## 2021-08-01 DIAGNOSIS — N951 Menopausal and female climacteric states: Secondary | ICD-10-CM | POA: Insufficient documentation

## 2021-08-01 DIAGNOSIS — R600 Localized edema: Secondary | ICD-10-CM | POA: Insufficient documentation

## 2021-08-01 MED ORDER — KETOCONAZOLE 2 % EX CREA: TOPICAL_CREAM | CUTANEOUS | 1 refills | Status: DC

## 2021-08-03 ENCOUNTER — Ambulatory Visit: Payer: Medicare Other

## 2021-08-05 ENCOUNTER — Other Ambulatory Visit: Payer: Self-pay | Admitting: Neurology

## 2021-08-06 NOTE — Progress Notes (Signed)
?  Subjective:  ?Patient ID: [name], female    DOB: 07-28-1950,  MRN: [id number] ? ?PHYILLIS DASCOLI presents to clinic today for preventative diabetic foot care and painful elongated mycotic toenails 1-5 bilaterally which are tender when wearing enclosed shoe gear. Pain is relieved with periodic professional debridement. ? ?Patient does not monitor blood glucose daily. ? ?New problem(s): None.  ? ?PCP is [name], MD , and last visit was March, 2023. ? ?Allergies  ?Allergen Reactions  ? Chlorthalidone Other (See Comments)  ?  Was stopped due to kidney function  ? Tramadol Other (See Comments)  ?  Not to be taken because of existing seizure disorder/SEIZURES  ? ? ?Review of Systems: Negative except as noted in the HPI. ? ?Objective:  ?[name] is a pleasant 71 y.o. female in NAD. AAO X 3. ? ?Vascular Examination: ?CFT immediate b/l LE. Palpable DP/PT pulses b/l LE. Digital hair present b/l. Skin temperature gradient WNL b/l. No pain with calf compression b/l. No edema noted b/l. No cyanosis or clubbing noted b/l LE. ? ?Dermatological Examination: ?Pedal integument with normal turgor, texture and tone b/l LE. No open wounds b/l. No interdigital macerations b/l. Toenails 1-5 b/l elongated, thickened, discolored with subungual debris. +Tenderness with dorsal palpation of nailplates. No hyperkeratotic or porokeratotic lesions present. Diffuse scaling noted peripherally and plantarly b/l feet.  No interdigital macerations.  No blisters, no weeping. No signs of secondary bacterial infection noted.  ? ?Musculoskeletal Examination: ?Muscle strength 5/5 to all lower extremity muscle groups bilaterally. No pain, crepitus or joint limitation noted with ROM bilateral LE. No gross bony deformities bilaterally. Patient ambulates independent of any assistive aids. ? ?Neurological Examination: ?Protective sensation intact 5/5 intact bilaterally with 10g monofilament b/l. Vibratory sensation intact b/l. ? ? ?  Latest  Ref Rng & Units 06/05/2021  ?  6:06 PM 04/05/2021  ? 12:22 AM 01/14/2021  ? 11:32 AM  ?Hemoglobin A1C  ?Hemoglobin-A1c 4.8 - 5.6 % 8.2   6.8   6.0    ? ?Assessment/Plan: ?1. Pain due to onychomycosis of toenails of both feet   ?2. Tinea pedis of both feet   ?3. Controlled type 2 diabetes mellitus without complication, without long-term current use of insulin ([place])   ?-Patient was evaluated and treated. All patient's and/or POA's questions/concerns answered on today's visit. ?-Toenails 1-5 b/l were debrided in length and girth with sterile nail nippers and dremel without iatrogenic bleeding.  ?-For tinea pedis, Rx sent to pharmacy for Ketoconazole Cream 2% to be applied once daily for six weeks. ?-Patient/POA to call should there be question/concern in the interim.  ? ?Return in about 3 months (around 10/31/2021). ? ?Marzetta Board, DPM  ?

## 2021-08-08 NOTE — Telephone Encounter (Signed)
Rx refilled.

## 2021-08-14 ENCOUNTER — Emergency Department (HOSPITAL_COMMUNITY): Payer: Medicare Other

## 2021-08-14 ENCOUNTER — Other Ambulatory Visit: Payer: Self-pay

## 2021-08-14 ENCOUNTER — Encounter (HOSPITAL_COMMUNITY): Payer: Self-pay

## 2021-08-14 ENCOUNTER — Observation Stay (HOSPITAL_COMMUNITY)
Admission: EM | Admit: 2021-08-14 | Discharge: 2021-08-15 | Disposition: A | Discharge status: Home / Self Care | Payer: Medicare Other | Attending: Family Medicine | Admitting: Family Medicine

## 2021-08-14 ENCOUNTER — Telehealth: Payer: Self-pay | Admitting: Student

## 2021-08-14 DIAGNOSIS — J45901 Unspecified asthma with (acute) exacerbation: Secondary | ICD-10-CM | POA: Diagnosis present

## 2021-08-14 DIAGNOSIS — Z955 Presence of coronary angioplasty implant and graft: Secondary | ICD-10-CM | POA: Diagnosis not present

## 2021-08-14 DIAGNOSIS — Z79899 Other long term (current) drug therapy: Secondary | ICD-10-CM | POA: Diagnosis not present

## 2021-08-14 DIAGNOSIS — Z87891 Personal history of nicotine dependence: Secondary | ICD-10-CM | POA: Insufficient documentation

## 2021-08-14 DIAGNOSIS — I5032 Chronic diastolic (congestive) heart failure: Secondary | ICD-10-CM | POA: Insufficient documentation

## 2021-08-14 DIAGNOSIS — I10 Essential (primary) hypertension: Secondary | ICD-10-CM | POA: Diagnosis present

## 2021-08-14 DIAGNOSIS — I2510 Atherosclerotic heart disease of native coronary artery without angina pectoris: Secondary | ICD-10-CM | POA: Insufficient documentation

## 2021-08-14 DIAGNOSIS — N1831 Chronic kidney disease, stage 3a: Secondary | ICD-10-CM | POA: Diagnosis not present

## 2021-08-14 DIAGNOSIS — F418 Other specified anxiety disorders: Secondary | ICD-10-CM | POA: Diagnosis present

## 2021-08-14 DIAGNOSIS — J4551 Severe persistent asthma with (acute) exacerbation: Principal | ICD-10-CM | POA: Insufficient documentation

## 2021-08-14 DIAGNOSIS — I130 Hypertensive heart and chronic kidney disease with heart failure and stage 1 through stage 4 chronic kidney disease, or unspecified chronic kidney disease: Secondary | ICD-10-CM | POA: Insufficient documentation

## 2021-08-14 DIAGNOSIS — Z20822 Contact with and (suspected) exposure to covid-19: Secondary | ICD-10-CM | POA: Diagnosis not present

## 2021-08-14 DIAGNOSIS — E785 Hyperlipidemia, unspecified: Secondary | ICD-10-CM | POA: Diagnosis present

## 2021-08-14 DIAGNOSIS — E1122 Type 2 diabetes mellitus with diabetic chronic kidney disease: Secondary | ICD-10-CM | POA: Insufficient documentation

## 2021-08-14 DIAGNOSIS — J4550 Severe persistent asthma, uncomplicated: Secondary | ICD-10-CM | POA: Diagnosis present

## 2021-08-14 DIAGNOSIS — Z7984 Long term (current) use of oral hypoglycemic drugs: Secondary | ICD-10-CM | POA: Diagnosis not present

## 2021-08-14 DIAGNOSIS — J45909 Unspecified asthma, uncomplicated: Secondary | ICD-10-CM | POA: Diagnosis present

## 2021-08-14 DIAGNOSIS — E119 Type 2 diabetes mellitus without complications: Secondary | ICD-10-CM

## 2021-08-14 DIAGNOSIS — K219 Gastro-esophageal reflux disease without esophagitis: Secondary | ICD-10-CM | POA: Diagnosis present

## 2021-08-14 DIAGNOSIS — G3184 Mild cognitive impairment, so stated: Secondary | ICD-10-CM

## 2021-08-14 LAB — BASIC METABOLIC PANEL
Anion gap: 7 (ref 5–15)
BUN: 19 mg/dL (ref 8–23)
CO2: 25 mmol/L (ref 22–32)
Calcium: 9.4 mg/dL (ref 8.9–10.3)
Chloride: 106 mmol/L (ref 98–111)
Creatinine, Ser: 1.19 mg/dL — ABNORMAL HIGH (ref 0.44–1.00)
GFR, Estimated: 49 mL/min — ABNORMAL LOW (ref >60)
Glucose, Bld: 169 mg/dL — ABNORMAL HIGH (ref 70–99)
Potassium: 4.3 mmol/L (ref 3.5–5.1)
Sodium: 138 mmol/L (ref 135–145)

## 2021-08-14 LAB — CBC WITH DIFFERENTIAL/PLATELET
Abs Immature Granulocytes: 0.05 10*3/uL (ref 0.00–0.07)
Basophils Absolute: 0.1 10*3/uL (ref 0.0–0.1)
Basophils Relative: 1 %
Eosinophils Absolute: 0 10*3/uL (ref 0.0–0.5)
Eosinophils Relative: 0 %
HCT: 38.9 % (ref 36.0–46.0)
Hemoglobin: 12.7 g/dL (ref 12.0–15.0)
Immature Granulocytes: 1 %
Lymphocytes Relative: 15 %
Lymphs Abs: 1.5 10*3/uL (ref 0.7–4.0)
MCH: 32.1 pg (ref 26.0–34.0)
MCHC: 32.6 g/dL (ref 30.0–36.0)
MCV: 98.2 fL (ref 80.0–100.0)
Monocytes Absolute: 0.5 10*3/uL (ref 0.1–1.0)
Monocytes Relative: 5 %
Neutro Abs: 8.1 10*3/uL — ABNORMAL HIGH (ref 1.7–7.7)
Neutrophils Relative %: 78 %
Platelets: 274 10*3/uL (ref 150–400)
RBC: 3.96 MIL/uL (ref 3.87–5.11)
RDW: 13.7 % (ref 11.5–15.5)
WBC: 10.2 10*3/uL (ref 4.0–10.5)
nRBC: 0 % (ref 0.0–0.2)

## 2021-08-14 LAB — RESP PANEL BY RT-PCR (FLU A&B, COVID) ARPGX2
Influenza A by PCR: NEGATIVE
Influenza B by PCR: NEGATIVE
SARS Coronavirus 2 by RT PCR: NEGATIVE

## 2021-08-14 LAB — BLOOD GAS, VENOUS
Acid-Base Excess: 1.9 mmol/L (ref 0.0–2.0)
Bicarbonate: 26.6 mmol/L (ref 20.0–28.0)
O2 Saturation: 96.3 %
Patient temperature: 37
pCO2, Ven: 41 mmHg — ABNORMAL LOW (ref 44–60)
pH, Ven: 7.42 (ref 7.25–7.43)
pO2, Ven: 69 mmHg — ABNORMAL HIGH (ref 32–45)

## 2021-08-14 LAB — BRAIN NATRIURETIC PEPTIDE: B Natriuretic Peptide: 50.7 pg/mL (ref 0.0–100.0)

## 2021-08-14 LAB — D-DIMER, QUANTITATIVE: D-Dimer, Quant: 0.37 ug/mL-FEU (ref 0.00–0.50)

## 2021-08-14 LAB — TROPONIN I (HIGH SENSITIVITY)
Troponin I (High Sensitivity): 4 ng/L (ref <18)
Troponin I (High Sensitivity): 3 ng/L (ref <18)

## 2021-08-14 MED ORDER — METHYLPREDNISOLONE SODIUM SUCC 125 MG IJ SOLR
80.0000 mg | Freq: Once | INTRAMUSCULAR | Status: AC
  Administered 2021-08-14: 80 mg via INTRAVENOUS
  Filled 2021-08-14: qty 2

## 2021-08-14 MED ORDER — LIDOCAINE VISCOUS HCL 2 % MT SOLN
15.0000 mL | Freq: Once | OROMUCOSAL | Status: AC
  Administered 2021-08-14: 15 mL via ORAL
  Filled 2021-08-14: qty 15

## 2021-08-14 MED ORDER — ADULT MULTIVITAMIN W/MINERALS CH
1.0000 | ORAL_TABLET | Freq: Every day | ORAL | Status: DC
  Administered 2021-08-15: 1 via ORAL
  Filled 2021-08-14: qty 1

## 2021-08-14 MED ORDER — GUAIFENESIN ER 600 MG PO TB12
600.0000 mg | ORAL_TABLET | Freq: Two times a day (BID) | ORAL | Status: DC
  Administered 2021-08-14 – 2021-08-15 (×2): 600 mg via ORAL
  Filled 2021-08-14 (×2): qty 1

## 2021-08-14 MED ORDER — METHYLPREDNISOLONE SODIUM SUCC 125 MG IJ SOLR
125.0000 mg | Freq: Once | INTRAMUSCULAR | Status: AC
  Administered 2021-08-14: 125 mg via INTRAVENOUS
  Filled 2021-08-14: qty 2

## 2021-08-14 MED ORDER — ACETAMINOPHEN 650 MG RE SUPP: 650.0000 mg | Freq: Four times a day (QID) | RECTAL | Status: DC | PRN

## 2021-08-14 MED ORDER — DILTIAZEM HCL ER COATED BEADS 120 MG PO CP24
240.0000 mg | ORAL_CAPSULE | Freq: Every day | ORAL | Status: DC
  Administered 2021-08-15: 240 mg via ORAL
  Filled 2021-08-14: qty 2

## 2021-08-14 MED ORDER — IPRATROPIUM-ALBUTEROL 0.5-2.5 (3) MG/3ML IN SOLN
RESPIRATORY_TRACT | Status: AC
  Filled 2021-08-14: qty 3

## 2021-08-14 MED ORDER — PANTOPRAZOLE SODIUM 40 MG PO TBEC
40.0000 mg | DELAYED_RELEASE_TABLET | Freq: Two times a day (BID) | ORAL | Status: DC
  Administered 2021-08-15: 40 mg via ORAL
  Filled 2021-08-14: qty 1

## 2021-08-14 MED ORDER — BUDESONIDE 0.25 MG/2ML IN SUSP
RESPIRATORY_TRACT | Status: AC
  Filled 2021-08-14: qty 2

## 2021-08-14 MED ORDER — ENOXAPARIN SODIUM 40 MG/0.4ML IJ SOSY
40.0000 mg | PREFILLED_SYRINGE | Freq: Every day | INTRAMUSCULAR | Status: DC
  Administered 2021-08-14: 40 mg via SUBCUTANEOUS
  Filled 2021-08-14: qty 0.4

## 2021-08-14 MED ORDER — BUDESONIDE 0.5 MG/2ML IN SUSP
0.5000 mg | Freq: Two times a day (BID) | RESPIRATORY_TRACT | Status: DC
  Administered 2021-08-14 – 2021-08-15 (×2): 0.5 mg via RESPIRATORY_TRACT
  Filled 2021-08-14 (×2): qty 2

## 2021-08-14 MED ORDER — MONTELUKAST SODIUM 10 MG PO TABS
10.0000 mg | ORAL_TABLET | Freq: Every day | ORAL | Status: DC
  Administered 2021-08-14: 10 mg via ORAL
  Filled 2021-08-14: qty 1

## 2021-08-14 MED ORDER — ATORVASTATIN CALCIUM 40 MG PO TABS
40.0000 mg | ORAL_TABLET | Freq: Every day | ORAL | Status: DC
  Administered 2021-08-14: 40 mg via ORAL
  Filled 2021-08-14: qty 1

## 2021-08-14 MED ORDER — INSULIN ASPART 100 UNIT/ML IJ SOLN
0.0000 [IU] | Freq: Every day | INTRAMUSCULAR | Status: DC
  Administered 2021-08-14: 2 [IU] via SUBCUTANEOUS

## 2021-08-14 MED ORDER — HYDRALAZINE HCL 20 MG/ML IJ SOLN
10.0000 mg | Freq: Once | INTRAMUSCULAR | Status: AC
  Administered 2021-08-14: 10 mg via INTRAVENOUS
  Filled 2021-08-14: qty 1

## 2021-08-14 MED ORDER — MAGNESIUM SULFATE 2 GM/50ML IV SOLN
2.0000 g | Freq: Once | INTRAVENOUS | Status: AC
  Administered 2021-08-14: 2 g via INTRAVENOUS
  Filled 2021-08-14: qty 50

## 2021-08-14 MED ORDER — INSULIN ASPART 100 UNIT/ML IJ SOLN
0.0000 [IU] | Freq: Three times a day (TID) | INTRAMUSCULAR | Status: DC
  Administered 2021-08-15: 5 [IU] via SUBCUTANEOUS

## 2021-08-14 MED ORDER — IPRATROPIUM-ALBUTEROL 0.5-2.5 (3) MG/3ML IN SOLN
3.0000 mL | Freq: Four times a day (QID) | RESPIRATORY_TRACT | Status: DC
  Administered 2021-08-14 – 2021-08-15 (×3): 3 mL via RESPIRATORY_TRACT
  Filled 2021-08-14 (×2): qty 3

## 2021-08-14 MED ORDER — NEBIVOLOL HCL 10 MG PO TABS
10.0000 mg | ORAL_TABLET | Freq: Every day | ORAL | Status: DC
  Administered 2021-08-15: 10 mg via ORAL
  Filled 2021-08-14: qty 1

## 2021-08-14 MED ORDER — ALPRAZOLAM 0.5 MG PO TABS
0.5000 mg | ORAL_TABLET | Freq: Every day | ORAL | Status: DC
  Administered 2021-08-14: 0.5 mg via ORAL
  Filled 2021-08-14: qty 1

## 2021-08-14 MED ORDER — ALUM & MAG HYDROXIDE-SIMETH 200-200-20 MG/5ML PO SUSP
30.0000 mL | Freq: Once | ORAL | Status: AC
  Administered 2021-08-14: 30 mL via ORAL
  Filled 2021-08-14: qty 30

## 2021-08-14 MED ORDER — DIVALPROEX SODIUM ER 500 MG PO TB24
500.0000 mg | ORAL_TABLET | Freq: Every day | ORAL | Status: DC
  Administered 2021-08-14: 500 mg via ORAL
  Filled 2021-08-14: qty 1

## 2021-08-14 MED ORDER — CLONIDINE HCL 0.1 MG PO TABS
0.1000 mg | ORAL_TABLET | Freq: Three times a day (TID) | ORAL | Status: DC | PRN
  Filled 2021-08-14: qty 1

## 2021-08-14 MED ORDER — LATANOPROST 0.005 % OP SOLN
1.0000 [drp] | Freq: Every day | OPHTHALMIC | Status: DC
  Administered 2021-08-14: 1 [drp] via OPHTHALMIC
  Filled 2021-08-14: qty 2.5

## 2021-08-14 MED ORDER — ACETAMINOPHEN 325 MG PO TABS: 650.0000 mg | ORAL_TABLET | Freq: Four times a day (QID) | ORAL | Status: DC | PRN

## 2021-08-14 MED ORDER — HYDRALAZINE HCL 50 MG PO TABS
100.0000 mg | ORAL_TABLET | Freq: Three times a day (TID) | ORAL | Status: DC
Start: 2021-08-14 — End: 2021-08-15
  Administered 2021-08-14 – 2021-08-15 (×2): 100 mg via ORAL
  Filled 2021-08-14 (×2): qty 2

## 2021-08-14 MED ORDER — FLUTICASONE PROPIONATE 50 MCG/ACT NA SUSP: 2.0000 | Freq: Every day | NASAL | Status: DC | PRN

## 2021-08-14 MED ORDER — ALBUTEROL SULFATE (2.5 MG/3ML) 0.083% IN NEBU
10.0000 mg | INHALATION_SOLUTION | Freq: Once | RESPIRATORY_TRACT | Status: AC
  Administered 2021-08-14: 10 mg via RESPIRATORY_TRACT

## 2021-08-14 MED ORDER — BUDESONIDE 0.5 MG/2ML IN SUSP
RESPIRATORY_TRACT | Status: AC
Start: 2021-08-14 — End: 2021-08-15
  Filled 2021-08-14: qty 2

## 2021-08-14 MED ORDER — IPRATROPIUM BROMIDE 0.02 % IN SOLN
1.0000 mg | Freq: Once | RESPIRATORY_TRACT | Status: AC
  Administered 2021-08-14: 1 mg via RESPIRATORY_TRACT

## 2021-08-14 MED ORDER — PREDNISONE 20 MG PO TABS: 40.0000 mg | ORAL_TABLET | Freq: Every day | ORAL | Status: DC

## 2021-08-14 MED ORDER — MEMANTINE HCL 10 MG PO TABS
10.0000 mg | ORAL_TABLET | Freq: Two times a day (BID) | ORAL | Status: DC
  Administered 2021-08-14 – 2021-08-15 (×2): 10 mg via ORAL
  Filled 2021-08-14 (×2): qty 1
  Filled 2021-08-14: qty 2

## 2021-08-14 NOTE — ED Provider Notes (Signed)
?[facility] DEPT ?Provider Note ? ? ?CSN: [id number] ?Arrival date & time: 08/14/21  1341 ? ?  ? ?History ? ?Chief Complaint  ?Patient presents with  ? Asthma  ? ? ?[name] is a 71 y.o. female. ? ? ?Asthma ? ? ?71 year old female with medical history significant for asthma, DM 2, hypertension, hyperlipidemia, GERD, CAD status post DES x2 to the RCA in 2017, depression, anxiety, CHF, CKD who presents to the emergency department with acute respiratory distress.  The patient states that she woke up this morning with worsening shortness of breath.  She denies any cough but does endorse chest pressure described as a tightness.  She tried 4 home nebulizers without improvement and presented to the emergency department for further evaluation.  She denies any fevers or chills. ? ?Home Medications ?Prior to Admission medications   ?Medication Sig Start Date End Date Taking? Authorizing Provider  ?acetaminophen-codeine (TYLENOL #3) 300-30 MG tablet Take 1 tablet by mouth every 6 (six) hours as needed for moderate pain or severe pain. 07/23/21  Yes [provider]  ?albuterol (PROAIR HFA) 108 (90 Base) MCG/ACT inhaler Inhale 2 puffs into the lungs every 4 (four) hours as needed for wheezing or shortness of breath. 04/20/21  Yes Parrett, [name] S, NP  ?albuterol (PROVENTIL) (2.5 MG/3ML) 0.083% nebulizer solution Take 3 mLs (2.5 mg total) by nebulization every 4 (four) hours as needed for wheezing or shortness of breath. 05/17/21  Yes Tanda Rockers, MD  ?ALPRAZolam Duanne Moron) 0.5 MG tablet Take 1 tablet (0.5 mg total) by mouth 2 (two) times daily as needed for anxiety. ?Patient taking differently: Take 0.5 mg by mouth at bedtime. 07/31/15  Yes Theodis Blaze, MD  ?atorvastatin (LIPITOR) 40 MG tablet Take 40 mg by mouth at bedtime.   Yes [provider]  ?Biotin 5000 MCG CAPS Take 5,000 mcg by mouth daily.   Yes [provider]  ?budesonide (PULMICORT) 0.5 MG/2ML nebulizer  solution USE 1 VIAL IN NEBULIZER IN THE MORNING AND AT BEDTIME 03/29/21  Yes Tanda Rockers, MD  ?diltiazem (CARDIZEM CD) 240 MG 24 hr capsule TAKE 1 CAPSULE BY MOUTH DAILY 07/27/21  Yes Jerline Pain, MD  ?divalproex (DEPAKOTE ER) 500 MG 24 hr tablet TAKE 1 TABLET BY MOUTH AT  BEDTIME 08/08/21  Yes Marcial Pacas, MD  ?ferrous sulfate 325 (65 FE) MG tablet 325 mg daily with breakfast. 07/15/21  Yes [provider]  ?fluticasone (FLONASE) 50 MCG/ACT nasal spray Place 2 sprays into both nostrils daily as needed for allergies.   Yes [provider]  ?formoterol (PERFOROMIST) 20 MCG/2ML nebulizer solution USE 1 VIAL  IN  NEBULIZER TWICE  DAILY - morning and evening 03/29/21  Yes Tanda Rockers, MD  ?glipiZIDE (GLUCOTROL) 5 MG tablet Take 5 mg by mouth daily before breakfast.   Yes [provider]  ?hydrALAZINE (APRESOLINE) 100 MG tablet TAKE 1 TABLET BY MOUTH 3 TIMES  DAILY 07/27/21  Yes Jerline Pain, MD  ?ketoconazole (NIZORAL) 2 % cream Apply to both feet and between toes once daily for 6 weeks for Athlete's feet. 08/01/21  Yes Galaway, Jennifer L, DPM  ?KLOR-CON M20 20 MEQ tablet Take 20 mEq by mouth every other day. 07/17/21  Yes [provider]  ?latanoprost (XALATAN) 0.005 % ophthalmic solution Place 1 drop into both eyes at bedtime. 02/05/20  Yes [provider]  ?Magnesium Oxide, Antacid, 400 MG TABS Take 1 tablet by mouth daily. 07/12/21  Yes  [provider]  ?memantine (NAMENDA) 10 MG tablet Take 1 tablet (10 mg total) by mouth 2 (two) times daily. 03/15/21  Yes Marcial Pacas, MD  ?metFORMIN (GLUCOPHAGE) 1000 MG tablet Take 1,000 mg by mouth 2 (two) times daily with a meal.   Yes [provider]  ?mometasone-formoterol (DULERA) 100-5 MCG/ACT AERO Inhale 2 puffs into the lungs 2 (two) times daily. ?Patient taking differently: Inhale 2 puffs into the lungs 2 (two) times daily as needed for wheezing or shortness of breath. 06/24/21  Yes Aline August, MD   ?montelukast (SINGULAIR) 10 MG tablet Take 1 tablet (10 mg total) by mouth at bedtime. 05/25/21  Yes Tanda Rockers, MD  ?Multiple Vitamin (MULTIVITAMIN WITH MINERALS) TABS tablet Take 1 tablet by mouth daily with breakfast.   Yes [provider]  ?nebivolol (BYSTOLIC) 10 MG tablet Take 1 tablet (10 mg total) by mouth daily. 04/10/19  Yes Jerline Pain, MD  ?pantoprazole (PROTONIX) 40 MG tablet Take 1 tablet (40 mg total) by mouth 2 (two) times daily before a meal. 06/24/21  Yes Aline August, MD  ?Probiotic Product (EQ PROBIOTIC) CAPS Take 1 capsule by mouth every morning.   Yes [provider]  ?magnesium oxide (MAG-OX) 400 (240 Mg) MG tablet Take 1 tablet (400 mg total) by mouth daily. ?Patient not taking: Reported on 08/14/2021 05/14/21   Lavina Hamman, MD  ?nitroGLYCERIN (NITROSTAT) 0.4 MG SL tablet Place 1 tablet (0.4 mg total) under the tongue every 5 (five) minutes as needed for chest pain. Don't exceed more than 3 doses in 15 mins 06/24/21   Aline August, MD  ?   ? ?Allergies    ?Chlorthalidone and Tramadol   ? ?Review of Systems   ?Review of Systems  ?Unable to perform ROS: Acuity of condition  ? ?Physical Exam ?Updated Vital Signs ?BP (!) 193/86   Pulse (!) 102   Temp 98.6 ?F (37 ?C) (Oral)   Resp 19   LMP  (LMP Unknown)   SpO2 100%  ?Physical Exam ?Vitals and nursing note reviewed.  ?Constitutional:   ?   General: She is in acute distress.  ?   Appearance: She is ill-appearing.  ?HENT:  ?   Head: Normocephalic and atraumatic.  ?Eyes:  ?   Conjunctiva/sclera: Conjunctivae normal.  ?   Pupils: Pupils are equal, round, and reactive to light.  ?Cardiovascular:  ?   Rate and Rhythm: Regular rhythm. Tachycardia present.  ?Pulmonary:  ?   Effort: Tachypnea, accessory muscle usage and respiratory distress present.  ?   Breath sounds: Decreased air movement present. Examination of the right-upper field reveals wheezing. Examination of the left-upper field reveals wheezing. Examination of  the right-middle field reveals wheezing. Examination of the left-middle field reveals wheezing. Examination of the right-lower field reveals wheezing. Examination of the left-lower field reveals wheezing. Wheezing present.  ?Abdominal:  ?   General: There is no distension.  ?   Tenderness: There is no guarding.  ?Musculoskeletal:     ?   General: No deformity or signs of injury.  ?   Cervical back: Neck supple.  ?Skin: ?   Findings: No lesion or rash.  ?Neurological:  ?   General: No focal deficit present.  ?   Mental Status: She is alert. Mental status is at baseline.  ? ? ?ED Results / Procedures / Treatments   ?Labs ?(all labs ordered are listed, but only abnormal results are displayed) ?Labs Reviewed  ?BASIC METABOLIC PANEL -  Abnormal; Notable for the following components:  ?    Result Value  ? Glucose, Bld 169 (*)   ? Creatinine, Ser 1.19 (*)   ? GFR, Estimated 49 (*)   ? All other components within normal limits  ?CBC WITH DIFFERENTIAL/PLATELET - Abnormal; Notable for the following components:  ? Neutro Abs 8.1 (*)   ? All other components within normal limits  ?BLOOD GAS, VENOUS - Abnormal; Notable for the following components:  ? pCO2, Ven 41 (*)   ? pO2, Ven 69 (*)   ? All other components within normal limits  ?RESP PANEL BY RT-PCR (FLU A&B, COVID) ARPGX2  ?BRAIN NATRIURETIC PEPTIDE  ?D-DIMER, QUANTITATIVE  ?TROPONIN I (HIGH SENSITIVITY)  ?TROPONIN I (HIGH SENSITIVITY)  ? ? ?EKG ?EKG Interpretation ? ?Date/Time:  Sunday August 14 2021 14:07:29 EDT ?Ventricular Rate:  73 ?PR Interval:  162 ?QRS Duration: 96 ?QT Interval:  399 ?QTC Calculation: 440 ?R Axis:   10 ?Text Interpretation: Sinus rhythm Confirmed by Regan Lemming ([id number]) on 08/14/2021 2:22:17 PM ? ?Radiology ?DG Chest Port 1 View ? ?Result Date: 08/14/2021 ?CLINICAL DATA:  Respiratory distress.  Wheezing since last night. EXAM: PORTABLE CHEST 1 VIEW COMPARISON:  06/19/2021 and older studies. FINDINGS: None cardiac silhouette is normal in size. No  mediastinal or hilar masses. Clear lungs.  No pleural effusion.  No pneumothorax. Skeletal structures are grossly intact. IMPRESSION: No active disease. Electronically Signed   By: Lajean Manes M.D.   On: 08/14/2021

## 2021-08-14 NOTE — ED Triage Notes (Signed)
Pt presents with c/o asthma. Family at bedside reported that she started wheezing last night and it has gotten progressively worse. Pt unable to talk in complete sentences. Pt does report chest pain 10/10. ?

## 2021-08-14 NOTE — H&P (Signed)
?History and Physical  ? ? ?Patient: [name] WJX:[id number] DOB: [birth year] ?DOA: 08/14/2021 ?DOS: the patient was seen and examined on 08/14/2021 ?PCP: [name], MD  ?Patient coming from: Home ? ?Chief Complaint:  ?Chief Complaint  ?Patient presents with  ? Asthma  ? ?HPI: [name] is a 71 y.o. female with medical history significant of HTN, severe persistent asthma, DM2, anxiety. Presenting with respiratory distress. She reports that she was in her normal state of health until last night. She acutely felt short of breath. She tried her rescue inhaler and had enough relief to be able to sleep. However, she woke this morning in distress again. She attempted several more rescue round, but they did not help. When her symptoms didn't improve, she decided to come to the ED for assistance. She denies sick contacts. She denies fever. She reports cough. She denies any other aggravating or alleviating factors.  ?  ?Review of Systems: As mentioned in the history of present illness. All other systems reviewed and are negative. ?Past Medical History:  ?Diagnosis Date  ? Anxiety   ? Arthritis   ? Asthma   ? Chronic diastolic CHF (congestive heart failure) ([place]) 07/17/2016  ? Coronary artery disease   ? a. NSTEMI 12/2015 - LHC 01/18/16: s/p overlapping DESx2 to RCA, 10% ost-prox Cx,10% mLAD.  ? Dementia ([place])   ? Depression   ? Diabetes mellitus   ? Dyslipidemia   ? GERD (gastroesophageal reflux disease)   ? History of seizure   ? Hypertension   ? Hypertensive heart disease   ? NSTEMI (non-ST elevated myocardial infarction) ([place]) 12/2015  ? Stage 3 chronic kidney disease ([place])   ? ?Past Surgical History:  ?Procedure Laterality Date  ? ABDOMINAL HYSTERECTOMY    ? CARDIAC CATHETERIZATION  1994  ? minimal LAD dz, no other dz, EF normal  ? CARDIAC CATHETERIZATION N/A 01/18/2016  ? Procedure: Left Heart Cath and Coronary Angiography;  Surgeon: Burnell Blanks, MD;  Location: [facility];  Service:  Cardiovascular;  Laterality: N/A;  ? CARDIAC CATHETERIZATION N/A 01/18/2016  ? Procedure: Coronary Stent Intervention;  Surgeon: Burnell Blanks, MD;  Location: [facility];  Service: Cardiovascular;  Laterality: N/A;  ? COLONOSCOPY WITH PROPOFOL N/A 04/09/2014  ? Procedure: COLONOSCOPY WITH PROPOFOL;  Surgeon: Cleotis Nipper, MD;  Location: [facility];  Service: Endoscopy;  Laterality: N/A;  ? CORONARY STENT PLACEMENT  01/19/2016  ? STENT SYNERGY DES 2.75X32 drug eluting stent was successfully placed, and overlaps the 2.5 x 38 mm Synergy stent placed distally.  ? ESOPHAGOGASTRODUODENOSCOPY (EGD) WITH PROPOFOL N/A 04/09/2014  ? Procedure: ESOPHAGOGASTRODUODENOSCOPY (EGD) WITH PROPOFOL;  Surgeon: Cleotis Nipper, MD;  Location: [facility];  Service: Endoscopy;  Laterality: N/A;  ? FOOT SURGERY Bilateral   ? approx ~2000  ? HEMORRHOID SURGERY    ? HERNIA REPAIR    ? KNEE ARTHROSCOPY    ? RADIOACTIVE SEED GUIDED EXCISIONAL BREAST BIOPSY Left 01/20/2021  ? Procedure: RADIOACTIVE SEED GUIDED EXCISIONAL LEFT BREAST BIOPSY;  Surgeon: Stark Klein, MD;  Location: [facility];  Service: General;  Laterality: Left;  ? ?Social History:  reports that she quit smoking about 17 years ago. Her smoking use included cigarettes. She has a 30.00 pack-year smoking history. She has never used smokeless tobacco. She reports that she does not drink alcohol and does not use drugs. ? ?Allergies  ?Allergen Reactions  ? Chlorthalidone Other (See Comments)  ?  Was stopped due to kidney  function  ? Tramadol Other (See Comments)  ?  Not to be taken because of existing seizure disorder/SEIZURES  ? ? ?Family History  ?Problem Relation Age of Onset  ? Allergies Mother   ? Asthma Mother   ? CAD Father 2  ? Diabetes Father   ? Hypertension Father   ? Congestive Heart Failure Sister   ?     died in her 69s  ? CAD Sister 28  ? ? ?Prior to Admission medications   ?Medication Sig Start Date End Date Taking? Authorizing Provider   ?acetaminophen-codeine (TYLENOL #3) 300-30 MG tablet Take 1 tablet by mouth every 6 (six) hours as needed for moderate pain or severe pain. 07/23/21  Yes [provider]  ?albuterol (PROAIR HFA) 108 (90 Base) MCG/ACT inhaler Inhale 2 puffs into the lungs every 4 (four) hours as needed for wheezing or shortness of breath. 04/20/21  Yes Parrett, [name] S, NP  ?albuterol (PROVENTIL) (2.5 MG/3ML) 0.083% nebulizer solution Take 3 mLs (2.5 mg total) by nebulization every 4 (four) hours as needed for wheezing or shortness of breath. 05/17/21  Yes Tanda Rockers, MD  ?ALPRAZolam Duanne Moron) 0.5 MG tablet Take 1 tablet (0.5 mg total) by mouth 2 (two) times daily as needed for anxiety. ?Patient taking differently: Take 0.5 mg by mouth at bedtime. 07/31/15  Yes Theodis Blaze, MD  ?atorvastatin (LIPITOR) 40 MG tablet Take 40 mg by mouth at bedtime.   Yes [provider]  ?Biotin 5000 MCG CAPS Take 5,000 mcg by mouth daily.   Yes [provider]  ?budesonide (PULMICORT) 0.5 MG/2ML nebulizer solution USE 1 VIAL IN NEBULIZER IN THE MORNING AND AT BEDTIME 03/29/21  Yes Tanda Rockers, MD  ?diltiazem (CARDIZEM CD) 240 MG 24 hr capsule TAKE 1 CAPSULE BY MOUTH DAILY 07/27/21  Yes Jerline Pain, MD  ?divalproex (DEPAKOTE ER) 500 MG 24 hr tablet TAKE 1 TABLET BY MOUTH AT  BEDTIME 08/08/21  Yes Marcial Pacas, MD  ?ferrous sulfate 325 (65 FE) MG tablet 325 mg daily with breakfast. 07/15/21  Yes [provider]  ?fluticasone (FLONASE) 50 MCG/ACT nasal spray Place 2 sprays into both nostrils daily as needed for allergies.   Yes [provider]  ?formoterol (PERFOROMIST) 20 MCG/2ML nebulizer solution USE 1 VIAL  IN  NEBULIZER TWICE  DAILY - morning and evening 03/29/21  Yes Tanda Rockers, MD  ?glipiZIDE (GLUCOTROL) 5 MG tablet Take 5 mg by mouth daily before breakfast.   Yes [provider]  ?hydrALAZINE (APRESOLINE) 100 MG tablet TAKE 1 TABLET BY MOUTH 3 TIMES  DAILY 07/27/21  Yes Jerline Pain,  MD  ?ketoconazole (NIZORAL) 2 % cream Apply to both feet and between toes once daily for 6 weeks for Athlete's feet. 08/01/21  Yes Galaway, Jennifer L, DPM  ?KLOR-CON M20 20 MEQ tablet Take 20 mEq by mouth every other day. 07/17/21  Yes [provider]  ?latanoprost (XALATAN) 0.005 % ophthalmic solution Place 1 drop into both eyes at bedtime. 02/05/20  Yes [provider]  ?Magnesium Oxide, Antacid, 400 MG TABS Take 1 tablet by mouth daily. 07/12/21  Yes [provider]  ?memantine (NAMENDA) 10 MG tablet Take 1 tablet (10 mg total) by mouth 2 (two) times daily. 03/15/21  Yes Marcial Pacas, MD  ?metFORMIN (GLUCOPHAGE) 1000 MG tablet Take 1,000 mg by mouth 2 (two) times daily with a meal.   Yes [provider]  ?mometasone-formoterol (DULERA) 100-5 MCG/ACT AERO Inhale 2 puffs into  the lungs 2 (two) times daily. ?Patient taking differently: Inhale 2 puffs into the lungs 2 (two) times daily as needed for wheezing or shortness of breath. 06/24/21  Yes Aline August, MD  ?montelukast (SINGULAIR) 10 MG tablet Take 1 tablet (10 mg total) by mouth at bedtime. 05/25/21  Yes Tanda Rockers, MD  ?Multiple Vitamin (MULTIVITAMIN WITH MINERALS) TABS tablet Take 1 tablet by mouth daily with breakfast.   Yes [provider]  ?nebivolol (BYSTOLIC) 10 MG tablet Take 1 tablet (10 mg total) by mouth daily. 04/10/19  Yes Jerline Pain, MD  ?pantoprazole (PROTONIX) 40 MG tablet Take 1 tablet (40 mg total) by mouth 2 (two) times daily before a meal. 06/24/21  Yes Aline August, MD  ?Probiotic Product (EQ PROBIOTIC) CAPS Take 1 capsule by mouth every morning.   Yes [provider]  ?magnesium oxide (MAG-OX) 400 (240 Mg) MG tablet Take 1 tablet (400 mg total) by mouth daily. ?Patient not taking: Reported on 08/14/2021 05/14/21   Lavina Hamman, MD  ?nitroGLYCERIN (NITROSTAT) 0.4 MG SL tablet Place 1 tablet (0.4 mg total) under the tongue every 5 (five) minutes as needed for chest pain. Don't  exceed more than 3 doses in 15 mins 06/24/21   Aline August, MD  ? ? ?Physical Exam: ?Vitals:  ? 08/14/21 1502 08/14/21 1520 08/14/21 1540 08/14/21 1600  ?BP: (!) 185/83 (!) 193/86 (!) 172/83 (!) 181/76  ?Pulse: 94 (!) 1

## 2021-08-14 NOTE — Progress Notes (Signed)
Pt as arrived to the floor room 1503 alert c/o headache 7/10. B/P high waiting for accessibility to pt meds via pyxis.  ?

## 2021-08-14 NOTE — ED Notes (Signed)
Patient taken to floor for admission ?

## 2021-08-15 ENCOUNTER — Ambulatory Visit: Payer: Medicare Other

## 2021-08-15 DIAGNOSIS — J4550 Severe persistent asthma, uncomplicated: Secondary | ICD-10-CM

## 2021-08-15 DIAGNOSIS — F418 Other specified anxiety disorders: Secondary | ICD-10-CM

## 2021-08-15 DIAGNOSIS — K219 Gastro-esophageal reflux disease without esophagitis: Secondary | ICD-10-CM

## 2021-08-15 DIAGNOSIS — E119 Type 2 diabetes mellitus without complications: Secondary | ICD-10-CM | POA: Diagnosis not present

## 2021-08-15 DIAGNOSIS — J4551 Severe persistent asthma with (acute) exacerbation: Secondary | ICD-10-CM | POA: Diagnosis not present

## 2021-08-15 DIAGNOSIS — E782 Mixed hyperlipidemia: Secondary | ICD-10-CM

## 2021-08-15 DIAGNOSIS — I10 Essential (primary) hypertension: Secondary | ICD-10-CM

## 2021-08-15 DIAGNOSIS — N1831 Chronic kidney disease, stage 3a: Secondary | ICD-10-CM

## 2021-08-15 DIAGNOSIS — G3184 Mild cognitive impairment, so stated: Secondary | ICD-10-CM

## 2021-08-15 LAB — COMPREHENSIVE METABOLIC PANEL
ALT: 16 U/L (ref 0–44)
AST: 21 U/L (ref 15–41)
Albumin: 3.8 g/dL (ref 3.5–5.0)
Alkaline Phosphatase: 55 U/L (ref 38–126)
Anion gap: 12 (ref 5–15)
BUN: 26 mg/dL — ABNORMAL HIGH (ref 8–23)
CO2: 22 mmol/L (ref 22–32)
Calcium: 9.7 mg/dL (ref 8.9–10.3)
Chloride: 105 mmol/L (ref 98–111)
Creatinine, Ser: 1.19 mg/dL — ABNORMAL HIGH (ref 0.44–1.00)
GFR, Estimated: 49 mL/min — ABNORMAL LOW (ref >60)
Glucose, Bld: 250 mg/dL — ABNORMAL HIGH (ref 70–99)
Potassium: 4.3 mmol/L (ref 3.5–5.1)
Sodium: 139 mmol/L (ref 135–145)
Total Bilirubin: 0.7 mg/dL (ref 0.3–1.2)
Total Protein: 7.1 g/dL (ref 6.5–8.1)

## 2021-08-15 LAB — CBC
HCT: 35.8 % — ABNORMAL LOW (ref 36.0–46.0)
Hemoglobin: 11.6 g/dL — ABNORMAL LOW (ref 12.0–15.0)
MCH: 31.4 pg (ref 26.0–34.0)
MCHC: 32.4 g/dL (ref 30.0–36.0)
MCV: 96.8 fL (ref 80.0–100.0)
Platelets: 233 10*3/uL (ref 150–400)
RBC: 3.7 MIL/uL — ABNORMAL LOW (ref 3.87–5.11)
RDW: 14.1 % (ref 11.5–15.5)
WBC: 8.7 10*3/uL (ref 4.0–10.5)
nRBC: 0 % (ref 0.0–0.2)

## 2021-08-15 LAB — GLUCOSE, CAPILLARY
Glucose-Capillary: 226 mg/dL — ABNORMAL HIGH (ref 70–99)
Glucose-Capillary: 241 mg/dL — ABNORMAL HIGH (ref 70–99)

## 2021-08-15 MED ORDER — PREDNISONE 20 MG PO TABS: 40.0000 mg | ORAL_TABLET | Freq: Every day | ORAL | 0 refills | Status: AC

## 2021-08-15 MED ORDER — AZITHROMYCIN 250 MG PO TABS: ORAL_TABLET | ORAL | 0 refills | Status: DC

## 2021-08-15 MED ORDER — FLUCONAZOLE 150 MG PO TABS: 150.0000 mg | ORAL_TABLET | Freq: Once | ORAL | 0 refills | Status: AC

## 2021-08-15 NOTE — Progress Notes (Signed)
Transition of Care (TOC) Screening Note ? ?Patient Details  ?Name: [name] ?Date of Birth: 24-Jun-1950 ? ?Transition of Care (TOC) CM/SW Contact:    ?Sherie Don, LCSW ?Phone Number: ?08/15/2021, 9:30 AM ? ?Transition of Care Department [facility]) has reviewed patient and no TOC needs have been identified at this time. We will continue to monitor patient advancement through interdisciplinary progression rounds. If new patient transition needs arise, please place a TOC consult. ?

## 2021-08-15 NOTE — Discharge Summary (Signed)
?Physician Discharge Summary ?  ?Patient: [name] MRN: [id number] DOB: 1951/04/20  ?Admit date:     08/14/2021  ?Discharge date: 08/15/21  ?Discharge Physician: Patrecia Pour  ? ?PCP: [name], MD  ? ?Recommendations at discharge:  ?Follow up with Dr. Melvyn Novas for routine asthma care. Admitted briefly for exacerbation, discharged on steroid burst.  ? ?Discharge Diagnoses: ?Principal Problem: ?  DM2 (diabetes mellitus, type 2) ([place]) ?Active Problems: ?  Depression with anxiety ?  Essential hypertension ?  GERD (gastroesophageal reflux disease) ?  Hyperlipidemia ?  Asthma exacerbation ?  Chronic kidney disease, stage 3a ([place]) ?  Severe persistent asthma without complication ?  Mild cognitive impairment ? ?Hospital Course: ?Per HPI: [name] is a 71 y.o. female with medical history significant of HTN, severe persistent asthma, DM2, anxiety. Presenting with respiratory distress. She reports that she was in her normal state of health until last night. She acutely felt short of breath. She tried her rescue inhaler and had enough relief to be able to sleep. However, she woke this morning in distress again. She attempted several more rescue round, but they did not help. When her symptoms didn't improve, she decided to come to the ED for assistance. She denies sick contacts. She denies fever. She reports cough. She denies any other aggravating or alleviating factors. CXR clear.  ? ?Hospital course: With nebs and steroids, the patient's respiratory status has normalized, wheezing remains intermittent but much better with no hypoxemia or increased respiratory effort. She is amenable to discharge and tolerating oral medications. She has nebulizer at home.  ? ?Assessment and Plan: ?Asthma exacerbation: Improved. Continue maintenance therapies at home, continue prn nebs at home, complete 5 day steroid burst. Nonproductive cough > complete 5 days azithromycin.  ? ?T2DM with steroid-induced hyperglycemia: Follow up with  PCP, abbreviate steroids to 5 days total.  ? ?The following medical conditions were at their baseline and treated with home medications:  ?HTN ?Anxiety ?Depression  ?GERD ?HLD ?Mild cognitive impairment ?CKD 3a ? ?Consultants: None ?Procedures performed: None  ?Disposition: Home ?Diet recommendation:  ?Discharge Diet Orders (From admission, onward)  ? ?  Start     Ordered  ? 08/15/21 0000  Diet Carb Modified       ? 08/15/21 0831  ? ?  ?  ? ?  ? ?Cardiac and Carb modified diet ?DISCHARGE MEDICATION: ?Allergies as of 08/15/2021   ? ?   Reactions  ? Chlorthalidone Other (See Comments)  ? Was stopped due to kidney function  ? Tramadol Other (See Comments)  ? Not to be taken because of existing seizure disorder/SEIZURES  ? ?  ? ?  ?Medication List  ?  ? ?STOP taking these medications   ? ?Magnesium Oxide (Antacid) 400 MG Tabs ?  ? ?  ? ?TAKE these medications   ? ?acetaminophen-codeine 300-30 MG tablet ?Commonly known as: TYLENOL #3 ?Take 1 tablet by mouth every 6 (six) hours as needed for moderate pain or severe pain. ?  ?albuterol 108 (90 Base) MCG/ACT inhaler ?Commonly known as: ProAir HFA ?Inhale 2 puffs into the lungs every 4 (four) hours as needed for wheezing or shortness of breath. ?  ?albuterol (2.5 MG/3ML) 0.083% nebulizer solution ?Commonly known as: PROVENTIL ?Take 3 mLs (2.5 mg total) by nebulization every 4 (four) hours as needed for wheezing or shortness of breath. ?  ?ALPRAZolam 0.5 MG tablet ?Commonly known as: Duanne Moron ?Take 1 tablet (0.5 mg total) by mouth 2 (two) times  daily as needed for anxiety. ?What changed: when to take this ?  ?atorvastatin 40 MG tablet ?Commonly known as: LIPITOR ?Take 40 mg by mouth at bedtime. ?  ?azithromycin 250 MG tablet ?Commonly known as: ZITHROMAX ?Take 2 tablets (500 mg total) by mouth daily for 1 day, THEN 1 tablet (250 mg total) daily for 4 days. ?Start taking on: August 15, 2021 ?  ?Biotin 5000 MCG Caps ?Take 5,000 mcg by mouth daily. ?  ?budesonide 0.5 MG/2ML nebulizer  solution ?Commonly known as: PULMICORT ?USE 1 VIAL IN NEBULIZER IN THE MORNING AND AT BEDTIME ?  ?diltiazem 240 MG 24 hr capsule ?Commonly known as: CARDIZEM CD ?TAKE 1 CAPSULE BY MOUTH DAILY ?  ?divalproex 500 MG 24 hr tablet ?Commonly known as: DEPAKOTE ER ?TAKE 1 TABLET BY MOUTH AT  BEDTIME ?  ?EQ Probiotic Caps ?Take 1 capsule by mouth every morning. ?  ?ferrous sulfate 325 (65 FE) MG tablet ?325 mg daily with breakfast. ?  ?fluconazole 150 MG tablet ?Commonly known as: DIFLUCAN ?Take 1 tablet (150 mg total) by mouth once for 1 dose. ?  ?fluticasone 50 MCG/ACT nasal spray ?Commonly known as: FLONASE ?Place 2 sprays into both nostrils daily as needed for allergies. ?  ?formoterol 20 MCG/2ML nebulizer solution ?Commonly known as: Perforomist ?USE 1 VIAL  IN  NEBULIZER TWICE  DAILY - morning and evening ?  ?glipiZIDE 5 MG tablet ?Commonly known as: GLUCOTROL ?Take 5 mg by mouth daily before breakfast. ?  ?hydrALAZINE 100 MG tablet ?Commonly known as: APRESOLINE ?TAKE 1 TABLET BY MOUTH 3 TIMES  DAILY ?  ?ketoconazole 2 % cream ?Commonly known as: NIZORAL ?Apply to both feet and between toes once daily for 6 weeks for Athlete's feet. ?  ?Klor-Con M20 20 MEQ tablet ?Generic drug: potassium chloride SA ?Take 20 mEq by mouth every other day. ?  ?latanoprost 0.005 % ophthalmic solution ?Commonly known as: XALATAN ?Place 1 drop into both eyes at bedtime. ?  ?magnesium oxide 400 (240 Mg) MG tablet ?Commonly known as: MAG-OX ?Take 1 tablet (400 mg total) by mouth daily. ?  ?memantine 10 MG tablet ?Commonly known as: NAMENDA ?Take 1 tablet (10 mg total) by mouth 2 (two) times daily. ?  ?metFORMIN 1000 MG tablet ?Commonly known as: GLUCOPHAGE ?Take 1,000 mg by mouth 2 (two) times daily with a meal. ?  ?mometasone-formoterol 100-5 MCG/ACT Aero ?Commonly known as: DULERA ?Inhale 2 puffs into the lungs 2 (two) times daily. ?What changed:  ?when to take this ?reasons to take this ?  ?montelukast 10 MG tablet ?Commonly known as:  SINGULAIR ?Take 1 tablet (10 mg total) by mouth at bedtime. ?  ?multivitamin with minerals Tabs tablet ?Take 1 tablet by mouth daily with breakfast. ?  ?nebivolol 10 MG tablet ?Commonly known as: BYSTOLIC ?Take 1 tablet (10 mg total) by mouth daily. ?  ?nitroGLYCERIN 0.4 MG SL tablet ?Commonly known as: NITROSTAT ?Place 1 tablet (0.4 mg total) under the tongue every 5 (five) minutes as needed for chest pain. Don't exceed more than 3 doses in 15 mins ?  ?pantoprazole 40 MG tablet ?Commonly known as: PROTONIX ?Take 1 tablet (40 mg total) by mouth 2 (two) times daily before a meal. ?  ?predniSONE 20 MG tablet ?Commonly known as: DELTASONE ?Take 2 tablets (40 mg total) by mouth daily after supper for 4 days. ?  ? ?  ? ? ?Discharge Exam: ?Filed Weights  ? 08/14/21 2200  ?Weight: 77.5 kg  ?BP (!) 162/80 (BP  Location: Right Arm)   Pulse 94   Temp 97.8 ?F (36.6 ?C) (Oral)   Resp 15   Ht 5' 2.01" (1.575 m)   Wt 77.5 kg   LMP  (LMP Unknown)   SpO2 100%   BMI 31.24 kg/m?   ?Pleasant 71yo F speaking full sentences ?Clear, nonlabored on room air ?RRR, no edema ? ?Condition at discharge: stable ? ?The results of significant diagnostics from this hospitalization (including imaging, microbiology, ancillary and laboratory) are listed below for reference.  ? ?Imaging Studies: ?DG Chest Port 1 View ? ?Result Date: 08/14/2021 ?CLINICAL DATA:  Respiratory distress.  Wheezing since last night. EXAM: PORTABLE CHEST 1 VIEW COMPARISON:  06/19/2021 and older studies. FINDINGS: None cardiac silhouette is normal in size. No mediastinal or hilar masses. Clear lungs.  No pleural effusion.  No pneumothorax. Skeletal structures are grossly intact. IMPRESSION: No active disease. Electronically Signed   By: Lajean Manes M.D.   On: 08/14/2021 14:42   ? ?Microbiology: ?Results for orders placed or performed during the hospital encounter of 08/14/21  ?Resp Panel by RT-PCR (Flu A&B, Covid)     Status: None  ? Collection Time: 08/14/21  2:38 PM   ?Result Value Ref Range Status  ? SARS Coronavirus 2 by RT PCR NEGATIVE NEGATIVE Final  ?  Comment: (NOTE) ?SARS-CoV-2 target nucleic acids are NOT DETECTED. ? ?The SARS-CoV-2 RNA is generally detectable in

## 2021-08-17 ENCOUNTER — Encounter (HOSPITAL_COMMUNITY): Payer: Self-pay

## 2021-08-19 ENCOUNTER — Encounter: Payer: Self-pay | Admitting: Cardiology

## 2021-08-20 ENCOUNTER — Other Ambulatory Visit: Payer: Self-pay

## 2021-08-20 ENCOUNTER — Emergency Department (HOSPITAL_COMMUNITY): Payer: Medicare Other

## 2021-08-20 ENCOUNTER — Inpatient Hospital Stay (HOSPITAL_COMMUNITY)
Admission: EM | Admit: 2021-08-20 | Discharge: 2021-08-24 | DRG: 202 | Disposition: A | Discharge status: Home / Self Care | Payer: Medicare Other | Attending: Student | Admitting: Student

## 2021-08-20 ENCOUNTER — Encounter (HOSPITAL_COMMUNITY): Payer: Self-pay

## 2021-08-20 DIAGNOSIS — Z20822 Contact with and (suspected) exposure to covid-19: Secondary | ICD-10-CM | POA: Diagnosis present

## 2021-08-20 DIAGNOSIS — I130 Hypertensive heart and chronic kidney disease with heart failure and stage 1 through stage 4 chronic kidney disease, or unspecified chronic kidney disease: Secondary | ICD-10-CM | POA: Diagnosis present

## 2021-08-20 DIAGNOSIS — E1165 Type 2 diabetes mellitus with hyperglycemia: Secondary | ICD-10-CM | POA: Diagnosis present

## 2021-08-20 DIAGNOSIS — E1142 Type 2 diabetes mellitus with diabetic polyneuropathy: Secondary | ICD-10-CM | POA: Diagnosis present

## 2021-08-20 DIAGNOSIS — I5032 Chronic diastolic (congestive) heart failure: Secondary | ICD-10-CM | POA: Diagnosis present

## 2021-08-20 DIAGNOSIS — N1831 Chronic kidney disease, stage 3a: Secondary | ICD-10-CM | POA: Diagnosis present

## 2021-08-20 DIAGNOSIS — I252 Old myocardial infarction: Secondary | ICD-10-CM

## 2021-08-20 DIAGNOSIS — E785 Hyperlipidemia, unspecified: Secondary | ICD-10-CM | POA: Diagnosis present

## 2021-08-20 DIAGNOSIS — E669 Obesity, unspecified: Principal | ICD-10-CM

## 2021-08-20 DIAGNOSIS — J45901 Unspecified asthma with (acute) exacerbation: Secondary | ICD-10-CM | POA: Diagnosis not present

## 2021-08-20 DIAGNOSIS — J441 Chronic obstructive pulmonary disease with (acute) exacerbation: Secondary | ICD-10-CM | POA: Diagnosis present

## 2021-08-20 DIAGNOSIS — F418 Other specified anxiety disorders: Secondary | ICD-10-CM | POA: Diagnosis present

## 2021-08-20 DIAGNOSIS — K5900 Constipation, unspecified: Secondary | ICD-10-CM | POA: Diagnosis present

## 2021-08-20 DIAGNOSIS — E1122 Type 2 diabetes mellitus with diabetic chronic kidney disease: Secondary | ICD-10-CM | POA: Diagnosis present

## 2021-08-20 DIAGNOSIS — J383 Other diseases of vocal cords: Secondary | ICD-10-CM | POA: Diagnosis present

## 2021-08-20 DIAGNOSIS — F410 Panic disorder [episodic paroxysmal anxiety] without agoraphobia: Secondary | ICD-10-CM | POA: Diagnosis present

## 2021-08-20 DIAGNOSIS — F03A3 Unspecified dementia, mild, with mood disturbance: Secondary | ICD-10-CM | POA: Diagnosis present

## 2021-08-20 DIAGNOSIS — I2510 Atherosclerotic heart disease of native coronary artery without angina pectoris: Secondary | ICD-10-CM | POA: Diagnosis present

## 2021-08-20 DIAGNOSIS — Z90710 Acquired absence of both cervix and uterus: Secondary | ICD-10-CM

## 2021-08-20 DIAGNOSIS — K219 Gastro-esophageal reflux disease without esophagitis: Secondary | ICD-10-CM | POA: Diagnosis present

## 2021-08-20 DIAGNOSIS — Z8249 Family history of ischemic heart disease and other diseases of the circulatory system: Secondary | ICD-10-CM

## 2021-08-20 DIAGNOSIS — E875 Hyperkalemia: Secondary | ICD-10-CM | POA: Diagnosis present

## 2021-08-20 DIAGNOSIS — Z7984 Long term (current) use of oral hypoglycemic drugs: Secondary | ICD-10-CM

## 2021-08-20 DIAGNOSIS — J449 Chronic obstructive pulmonary disease, unspecified: Secondary | ICD-10-CM | POA: Diagnosis present

## 2021-08-20 DIAGNOSIS — Z79899 Other long term (current) drug therapy: Secondary | ICD-10-CM

## 2021-08-20 DIAGNOSIS — Z955 Presence of coronary angioplasty implant and graft: Secondary | ICD-10-CM

## 2021-08-20 DIAGNOSIS — I10 Essential (primary) hypertension: Secondary | ICD-10-CM | POA: Diagnosis present

## 2021-08-20 DIAGNOSIS — Z87891 Personal history of nicotine dependence: Secondary | ICD-10-CM

## 2021-08-20 DIAGNOSIS — Z825 Family history of asthma and other chronic lower respiratory diseases: Secondary | ICD-10-CM

## 2021-08-20 DIAGNOSIS — F03A4 Unspecified dementia, mild, with anxiety: Secondary | ICD-10-CM | POA: Diagnosis present

## 2021-08-20 DIAGNOSIS — Z833 Family history of diabetes mellitus: Secondary | ICD-10-CM

## 2021-08-20 LAB — GLUCOSE, CAPILLARY
Glucose-Capillary: 286 mg/dL — ABNORMAL HIGH (ref 70–99)
Glucose-Capillary: 198 mg/dL — ABNORMAL HIGH (ref 70–99)

## 2021-08-20 LAB — CBC WITH DIFFERENTIAL/PLATELET
Abs Immature Granulocytes: 0.08 10*3/uL — ABNORMAL HIGH (ref 0.00–0.07)
Basophils Absolute: 0 10*3/uL (ref 0.0–0.1)
Basophils Relative: 0 %
Eosinophils Absolute: 0 10*3/uL (ref 0.0–0.5)
Eosinophils Relative: 0 %
HCT: 38.2 % (ref 36.0–46.0)
Hemoglobin: 12.5 g/dL (ref 12.0–15.0)
Immature Granulocytes: 1 %
Lymphocytes Relative: 14 %
Lymphs Abs: 1.7 10*3/uL (ref 0.7–4.0)
MCH: 31.7 pg (ref 26.0–34.0)
MCHC: 32.7 g/dL (ref 30.0–36.0)
MCV: 97 fL (ref 80.0–100.0)
Monocytes Absolute: 0.3 10*3/uL (ref 0.1–1.0)
Monocytes Relative: 3 %
Neutro Abs: 9.9 10*3/uL — ABNORMAL HIGH (ref 1.7–7.7)
Neutrophils Relative %: 82 %
Platelets: 295 10*3/uL (ref 150–400)
RBC: 3.94 MIL/uL (ref 3.87–5.11)
RDW: 13.6 % (ref 11.5–15.5)
WBC: 12.1 10*3/uL — ABNORMAL HIGH (ref 4.0–10.5)
nRBC: 0 % (ref 0.0–0.2)

## 2021-08-20 LAB — COMPREHENSIVE METABOLIC PANEL
ALT: 14 U/L (ref 0–44)
AST: 29 U/L (ref 15–41)
Albumin: 4 g/dL (ref 3.5–5.0)
Alkaline Phosphatase: 53 U/L (ref 38–126)
Anion gap: 10 (ref 5–15)
BUN: 31 mg/dL — ABNORMAL HIGH (ref 8–23)
CO2: 22 mmol/L (ref 22–32)
Calcium: 9.5 mg/dL (ref 8.9–10.3)
Chloride: 105 mmol/L (ref 98–111)
Creatinine, Ser: 1.56 mg/dL — ABNORMAL HIGH (ref 0.44–1.00)
GFR, Estimated: 35 mL/min — ABNORMAL LOW (ref >60)
Glucose, Bld: 250 mg/dL — ABNORMAL HIGH (ref 70–99)
Potassium: 5.3 mmol/L — ABNORMAL HIGH (ref 3.5–5.1)
Sodium: 137 mmol/L (ref 135–145)
Total Bilirubin: 0.9 mg/dL (ref 0.3–1.2)
Total Protein: 7.5 g/dL (ref 6.5–8.1)

## 2021-08-20 LAB — BLOOD GAS, VENOUS
Acid-Base Excess: 0.7 mmol/L (ref 0.0–2.0)
Bicarbonate: 25.4 mmol/L (ref 20.0–28.0)
O2 Saturation: 99.5 %
Patient temperature: 37
pCO2, Ven: 40 mmHg — ABNORMAL LOW (ref 44–60)
pH, Ven: 7.41 (ref 7.25–7.43)
pO2, Ven: 93 mmHg — ABNORMAL HIGH (ref 32–45)

## 2021-08-20 LAB — CBG MONITORING, ED: Glucose-Capillary: 393 mg/dL — ABNORMAL HIGH (ref 70–99)

## 2021-08-20 LAB — TROPONIN I (HIGH SENSITIVITY)
Troponin I (High Sensitivity): 4 ng/L (ref <18)
Troponin I (High Sensitivity): 3 ng/L (ref <18)

## 2021-08-20 LAB — BRAIN NATRIURETIC PEPTIDE: B Natriuretic Peptide: 24 pg/mL (ref 0.0–100.0)

## 2021-08-20 LAB — D-DIMER, QUANTITATIVE: D-Dimer, Quant: 0.42 ug/mL-FEU (ref 0.00–0.50)

## 2021-08-20 MED ORDER — ALBUTEROL SULFATE (2.5 MG/3ML) 0.083% IN NEBU
10.0000 mg/h | INHALATION_SOLUTION | RESPIRATORY_TRACT | Status: DC
  Administered 2021-08-20: 10 mg/h via RESPIRATORY_TRACT
  Filled 2021-08-20: qty 12

## 2021-08-20 MED ORDER — NITROGLYCERIN 0.4 MG SL SUBL: 0.4000 mg | SUBLINGUAL_TABLET | SUBLINGUAL | Status: DC | PRN

## 2021-08-20 MED ORDER — ACETAMINOPHEN-CODEINE #3 300-30 MG PO TABS
1.0000 | ORAL_TABLET | Freq: Four times a day (QID) | ORAL | Status: DC | PRN
Start: 2021-08-20 — End: 2021-08-24
  Administered 2021-08-20 – 2021-08-23 (×7): 1 via ORAL
  Filled 2021-08-20 (×7): qty 1

## 2021-08-20 MED ORDER — HYDROCODONE-ACETAMINOPHEN 5-325 MG PO TABS
0.5000 | ORAL_TABLET | Freq: Once | ORAL | Status: AC
  Administered 2021-08-20: 0.5 via ORAL
  Filled 2021-08-20: qty 1

## 2021-08-20 MED ORDER — ENOXAPARIN SODIUM 40 MG/0.4ML IJ SOSY
40.0000 mg | PREFILLED_SYRINGE | INTRAMUSCULAR | Status: DC
  Administered 2021-08-20 – 2021-08-23 (×4): 40 mg via SUBCUTANEOUS
  Filled 2021-08-20 (×4): qty 0.4

## 2021-08-20 MED ORDER — GLIPIZIDE 5 MG PO TABS
5.0000 mg | ORAL_TABLET | Freq: Every day | ORAL | Status: DC
  Administered 2021-08-21 – 2021-08-24 (×4): 5 mg via ORAL
  Filled 2021-08-20 (×4): qty 1

## 2021-08-20 MED ORDER — LORATADINE 10 MG PO TABS
10.0000 mg | ORAL_TABLET | Freq: Every day | ORAL | Status: DC
Start: 2021-08-20 — End: 2021-08-24
  Administered 2021-08-20 – 2021-08-24 (×5): 10 mg via ORAL
  Filled 2021-08-20 (×5): qty 1

## 2021-08-20 MED ORDER — IPRATROPIUM-ALBUTEROL 0.5-2.5 (3) MG/3ML IN SOLN
3.0000 mL | Freq: Once | RESPIRATORY_TRACT | Status: AC
  Administered 2021-08-20: 3 mL via RESPIRATORY_TRACT
  Filled 2021-08-20: qty 3

## 2021-08-20 MED ORDER — ALBUTEROL SULFATE (2.5 MG/3ML) 0.083% IN NEBU
2.5000 mg | INHALATION_SOLUTION | Freq: Four times a day (QID) | RESPIRATORY_TRACT | Status: DC
  Administered 2021-08-20: 2.5 mg via RESPIRATORY_TRACT
  Filled 2021-08-20: qty 3

## 2021-08-20 MED ORDER — POTASSIUM CHLORIDE CRYS ER 20 MEQ PO TBCR: 20.0000 meq | EXTENDED_RELEASE_TABLET | ORAL | Status: DC

## 2021-08-20 MED ORDER — ACETAMINOPHEN 650 MG RE SUPP: 650.0000 mg | Freq: Four times a day (QID) | RECTAL | Status: DC | PRN

## 2021-08-20 MED ORDER — MEMANTINE HCL 10 MG PO TABS
10.0000 mg | ORAL_TABLET | Freq: Two times a day (BID) | ORAL | Status: DC
  Administered 2021-08-20 – 2021-08-24 (×8): 10 mg via ORAL
  Filled 2021-08-20 (×8): qty 1

## 2021-08-20 MED ORDER — ONDANSETRON HCL 4 MG/2ML IJ SOLN: 4.0000 mg | Freq: Four times a day (QID) | INTRAMUSCULAR | Status: DC | PRN

## 2021-08-20 MED ORDER — HYDRALAZINE HCL 50 MG PO TABS
100.0000 mg | ORAL_TABLET | Freq: Three times a day (TID) | ORAL | Status: DC
  Administered 2021-08-20 – 2021-08-24 (×11): 100 mg via ORAL
  Filled 2021-08-20 (×11): qty 2

## 2021-08-20 MED ORDER — ALUM & MAG HYDROXIDE-SIMETH 200-200-20 MG/5ML PO SUSP
30.0000 mL | Freq: Four times a day (QID) | ORAL | Status: DC | PRN
  Administered 2021-08-20 – 2021-08-24 (×2): 30 mL via ORAL
  Filled 2021-08-20 (×2): qty 30

## 2021-08-20 MED ORDER — SODIUM CHLORIDE 0.45 % IV SOLN: INTRAVENOUS | Status: AC

## 2021-08-20 MED ORDER — BUDESONIDE 0.5 MG/2ML IN SUSP
0.5000 mg | Freq: Two times a day (BID) | RESPIRATORY_TRACT | Status: DC
  Administered 2021-08-20 – 2021-08-24 (×8): 0.5 mg via RESPIRATORY_TRACT
  Filled 2021-08-20 (×8): qty 2

## 2021-08-20 MED ORDER — ACETAMINOPHEN 325 MG PO TABS: 650.0000 mg | ORAL_TABLET | Freq: Four times a day (QID) | ORAL | Status: DC | PRN

## 2021-08-20 MED ORDER — LORAZEPAM 2 MG/ML IJ SOLN
0.5000 mg | Freq: Once | INTRAMUSCULAR | Status: AC
  Administered 2021-08-20: 0.5 mg via INTRAVENOUS
  Filled 2021-08-20: qty 1

## 2021-08-20 MED ORDER — ALPRAZOLAM 0.5 MG PO TABS
0.5000 mg | ORAL_TABLET | Freq: Every day | ORAL | Status: DC
  Administered 2021-08-20: 0.5 mg via ORAL
  Filled 2021-08-20: qty 1

## 2021-08-20 MED ORDER — NEBIVOLOL HCL 10 MG PO TABS
10.0000 mg | ORAL_TABLET | Freq: Every day | ORAL | Status: DC
Start: 2021-08-20 — End: 2021-08-24
  Administered 2021-08-20 – 2021-08-24 (×5): 10 mg via ORAL
  Filled 2021-08-20 (×5): qty 1

## 2021-08-20 MED ORDER — INSULIN ASPART 100 UNIT/ML IJ SOLN
0.0000 [IU] | Freq: Three times a day (TID) | INTRAMUSCULAR | Status: DC
  Administered 2021-08-20: 20 [IU] via SUBCUTANEOUS
  Administered 2021-08-20 – 2021-08-21 (×2): 4 [IU] via SUBCUTANEOUS
  Administered 2021-08-21: 15 [IU] via SUBCUTANEOUS
  Administered 2021-08-22: 7 [IU] via SUBCUTANEOUS
  Administered 2021-08-22: 4 [IU] via SUBCUTANEOUS
  Administered 2021-08-22: 20 [IU] via SUBCUTANEOUS
  Administered 2021-08-23: 7 [IU] via SUBCUTANEOUS
  Administered 2021-08-23: 15 [IU] via SUBCUTANEOUS
  Administered 2021-08-23: 7 [IU] via SUBCUTANEOUS
  Filled 2021-08-20: qty 0.2

## 2021-08-20 MED ORDER — DILTIAZEM HCL 25 MG/5ML IV SOLN
20.0000 mg | Freq: Once | INTRAVENOUS | Status: AC
  Administered 2021-08-20: 20 mg via INTRAVENOUS
  Filled 2021-08-20: qty 5

## 2021-08-20 MED ORDER — PREDNISONE 20 MG PO TABS
40.0000 mg | ORAL_TABLET | Freq: Every day | ORAL | Status: DC
  Administered 2021-08-20: 40 mg via ORAL
  Filled 2021-08-20: qty 2

## 2021-08-20 MED ORDER — METHYLPREDNISOLONE SODIUM SUCC 125 MG IJ SOLR
125.0000 mg | Freq: Once | INTRAMUSCULAR | Status: AC
  Administered 2021-08-20: 125 mg via INTRAVENOUS
  Filled 2021-08-20: qty 2

## 2021-08-20 MED ORDER — METFORMIN HCL 500 MG PO TABS
500.0000 mg | ORAL_TABLET | Freq: Two times a day (BID) | ORAL | Status: DC
  Administered 2021-08-20: 500 mg via ORAL
  Filled 2021-08-20: qty 1

## 2021-08-20 MED ORDER — DIVALPROEX SODIUM ER 500 MG PO TB24
500.0000 mg | ORAL_TABLET | Freq: Every day | ORAL | Status: DC
Start: 2021-08-20 — End: 2021-08-24
  Administered 2021-08-20 – 2021-08-23 (×4): 500 mg via ORAL
  Filled 2021-08-20 (×4): qty 1

## 2021-08-20 MED ORDER — ONDANSETRON HCL 4 MG PO TABS: 4.0000 mg | ORAL_TABLET | Freq: Four times a day (QID) | ORAL | Status: DC | PRN

## 2021-08-20 MED ORDER — IPRATROPIUM-ALBUTEROL 0.5-2.5 (3) MG/3ML IN SOLN
3.0000 mL | Freq: Four times a day (QID) | RESPIRATORY_TRACT | Status: DC
  Administered 2021-08-20: 3 mL via RESPIRATORY_TRACT
  Filled 2021-08-20: qty 3

## 2021-08-20 MED ORDER — METFORMIN HCL 500 MG PO TABS
1000.0000 mg | ORAL_TABLET | Freq: Two times a day (BID) | ORAL | Status: DC
Start: 2021-08-20 — End: 2021-08-20

## 2021-08-20 MED ORDER — DILTIAZEM HCL ER COATED BEADS 120 MG PO CP24
240.0000 mg | ORAL_CAPSULE | Freq: Every day | ORAL | Status: DC
  Administered 2021-08-20 – 2021-08-24 (×5): 240 mg via ORAL
  Filled 2021-08-20 (×5): qty 2

## 2021-08-20 MED ORDER — PANTOPRAZOLE SODIUM 40 MG PO TBEC
40.0000 mg | DELAYED_RELEASE_TABLET | Freq: Two times a day (BID) | ORAL | Status: DC
  Administered 2021-08-20 – 2021-08-24 (×8): 40 mg via ORAL
  Filled 2021-08-20 (×8): qty 1

## 2021-08-20 MED ORDER — FERROUS SULFATE 325 (65 FE) MG PO TABS
325.0000 mg | ORAL_TABLET | Freq: Every day | ORAL | Status: DC
  Administered 2021-08-21 – 2021-08-24 (×4): 325 mg via ORAL
  Filled 2021-08-20 (×4): qty 1

## 2021-08-20 MED ORDER — LATANOPROST 0.005 % OP SOLN
1.0000 [drp] | Freq: Every day | OPHTHALMIC | Status: DC
  Administered 2021-08-20 – 2021-08-23 (×4): 1 [drp] via OPHTHALMIC
  Filled 2021-08-20: qty 2.5

## 2021-08-20 MED ORDER — IPRATROPIUM-ALBUTEROL 0.5-2.5 (3) MG/3ML IN SOLN
3.0000 mL | Freq: Three times a day (TID) | RESPIRATORY_TRACT | Status: DC
  Administered 2021-08-20 – 2021-08-24 (×10): 3 mL via RESPIRATORY_TRACT
  Filled 2021-08-20 (×11): qty 3

## 2021-08-20 MED ORDER — ALBUTEROL SULFATE (2.5 MG/3ML) 0.083% IN NEBU
2.5000 mg | INHALATION_SOLUTION | RESPIRATORY_TRACT | Status: DC | PRN
  Administered 2021-08-21 – 2021-08-22 (×2): 2.5 mg via RESPIRATORY_TRACT
  Filled 2021-08-20 (×2): qty 3

## 2021-08-20 MED ORDER — MONTELUKAST SODIUM 10 MG PO TABS
10.0000 mg | ORAL_TABLET | Freq: Every day | ORAL | Status: DC
  Administered 2021-08-20 – 2021-08-23 (×4): 10 mg via ORAL
  Filled 2021-08-20 (×4): qty 1

## 2021-08-20 MED ORDER — IPRATROPIUM BROMIDE 0.02 % IN SOLN
0.5000 mg | Freq: Four times a day (QID) | RESPIRATORY_TRACT | Status: DC
  Administered 2021-08-20: 0.5 mg via RESPIRATORY_TRACT
  Filled 2021-08-20: qty 2.5

## 2021-08-20 NOTE — ED Triage Notes (Signed)
Pt reports with [facility] and asthma. Pt reports being recently hospitalized with asthma issues.  ?

## 2021-08-20 NOTE — H&P (Signed)
?History and Physical  ? ? ?Patient: [name] VOJ:[id number] DOB: 1950/10/12 ?DOA: 08/20/2021 ?DOS: the patient was seen and examined on 08/20/2021 ?PCP: [name], MD  ?Patient coming from: Home ? ?Chief Complaint:  ?Chief Complaint  ?Patient presents with  ? Shortness of Breath  ? Asthma  ? ?HPI: [name] is a 71 y.o. female with medical history significant of anxiety, depression, mild dementia, mild cognitive impairment, chronic diastolic CHF, coronary artery disease, type II DM, hyperlipidemia, GERD, history of seizures, hypertension, hypertensive heart disease, CAD, history NSTEMI, stage 3b CKD, class I obesity with a current BMI of 31.09 kg/m? who was recently admitted and discharged due to COPD exacerbation and is returning to the emergency department with complaints of worsening dyspnea, wheezing, nonproductive cough, rhinorrhea and occasional sinus pressure who woke up about an hour before arriving to the emergency department and after asking her daughter to bring her in for evaluation.  She stated she has been having a lot of of issues with her allergies lately.  These issues are usually worse in the early spring and fall.  She has been compliant with her medications for asthma and allergies which include budesonide, formoterol, montelukast, albuterol and pantoprazole.  She does finish her prednisone and her azithromycin course.   She denied fever, chills, significant sputum production or appetite changes.  No sore throat and her daughter stated that her voice sounds better.  She has had some trouble sleeping while on prednisone.  She complains of pleuritic chest and rib cage wall, but denied palpitations, diaphoresis, nausea, emesis, PND, orthopnea.  She gets occasional lower extremity edema.  No abdominal pain, diarrhea, melena or hematochezia.  She gets occasionally constipated.  No flank pain, dysuria, frequency or hematuria.  No polyuria, polydipsia, polyphagia or blurred vision. ? ?ED  course: Initial vital signs were temperature 98 ?F, pulse 86, respiration 24, BP 161/83 mmHg and O2 sat 97% on room air.  The patient received half a tablet of Norco 5/325 mg, a DuoNeb, lorazepam 0.5 mg IVP and methylprednisolone 125 mg IVP x1. ? ?Labwork: Her CBC is her white count 12.1, hemoglobin 12.5 g/dL platelets 295.  Normal D-dimer.  Troponin x2 negative.  BNP 24.0 pg/mL.  Venous blood gas showed normal pH with decreased PCO2 of 40 and increased PO2 at 93 mmHg.  CMP showed a potassium level 5.3 mmol/L, the rest of the electrolytes were normal.  Glucose 250, BUN 31 and creatinine 1.56 mg/dL. ? ?Imaging: Portable 1 view chest radiograph showed normal heart size and mediastinal contours with clear lungs. ?  ?Review of Systems: As mentioned in the history of present illness. All other systems reviewed and are negative. ?Past Medical History:  ?Diagnosis Date  ? Anxiety   ? Arthritis   ? Asthma   ? Chronic diastolic CHF (congestive heart failure) ([place]) 07/17/2016  ? Coronary artery disease   ? a. NSTEMI 12/2015 - LHC 01/18/16: s/p overlapping DESx2 to RCA, 10% ost-prox Cx,10% mLAD.  ? Dementia ([place])   ? Depression   ? Diabetes mellitus   ? Dyslipidemia   ? GERD (gastroesophageal reflux disease)   ? History of seizure   ? Hypertension   ? Hypertensive heart disease   ? NSTEMI (non-ST elevated myocardial infarction) ([place]) 12/2015  ? Stage 3 chronic kidney disease ([place])   ? ?Past Surgical History:  ?Procedure Laterality Date  ? ABDOMINAL HYSTERECTOMY    ? CARDIAC CATHETERIZATION  1994  ? minimal LAD dz, no other dz, EF  normal  ? CARDIAC CATHETERIZATION N/A 01/18/2016  ? Procedure: Left Heart Cath and Coronary Angiography;  Surgeon: Burnell Blanks, MD;  Location: [place];  Service: Cardiovascular;  Laterality: N/A;  ? CARDIAC CATHETERIZATION N/A 01/18/2016  ? Procedure: Coronary Stent Intervention;  Surgeon: Burnell Blanks, MD;  Location: [place];  Service: Cardiovascular;   Laterality: N/A;  ? COLONOSCOPY WITH PROPOFOL N/A 04/09/2014  ? Procedure: COLONOSCOPY WITH PROPOFOL;  Surgeon: Cleotis Nipper, MD;  Location: [facility];  Service: Endoscopy;  Laterality: N/A;  ? CORONARY STENT PLACEMENT  01/19/2016  ? STENT SYNERGY DES 2.75X32 drug eluting stent was successfully placed, and overlaps the 2.5 x 38 mm Synergy stent placed distally.  ? ESOPHAGOGASTRODUODENOSCOPY (EGD) WITH PROPOFOL N/A 04/09/2014  ? Procedure: ESOPHAGOGASTRODUODENOSCOPY (EGD) WITH PROPOFOL;  Surgeon: Cleotis Nipper, MD;  Location: [facility];  Service: Endoscopy;  Laterality: N/A;  ? FOOT SURGERY Bilateral   ? approx ~2000  ? HEMORRHOID SURGERY    ? HERNIA REPAIR    ? KNEE ARTHROSCOPY    ? RADIOACTIVE SEED GUIDED EXCISIONAL BREAST BIOPSY Left 01/20/2021  ? Procedure: RADIOACTIVE SEED GUIDED EXCISIONAL LEFT BREAST BIOPSY;  Surgeon: Stark Klein, MD;  Location: [facility];  Service: General;  Laterality: Left;  ? ?Social History:  reports that she quit smoking about 17 years ago. Her smoking use included cigarettes. She has a 30.00 pack-year smoking history. She has never used smokeless tobacco. She reports that she does not drink alcohol and does not use drugs. ? ?Allergies  ?Allergen Reactions  ? Chlorthalidone Other (See Comments)  ?  Was stopped due to kidney function  ? Tramadol Other (See Comments)  ?  Not to be taken because of existing seizure disorder/SEIZURES  ? ? ?Family History  ?Problem Relation Age of Onset  ? Allergies Mother   ? Asthma Mother   ? CAD Father 41  ? Diabetes Father   ? Hypertension Father   ? Congestive Heart Failure Sister   ?     died in her 86s  ? CAD Sister 23  ? ? ?Prior to Admission medications   ?Medication Sig Start Date End Date Taking? Authorizing Provider  ?acetaminophen-codeine (TYLENOL #3) 300-30 MG tablet Take 1 tablet by mouth every 6 (six) hours as needed for moderate pain or severe pain. 07/23/21   [provider]  ?albuterol (PROAIR HFA) 108 (90 Base) MCG/ACT  inhaler Inhale 2 puffs into the lungs every 4 (four) hours as needed for wheezing or shortness of breath. 04/20/21   Parrett, Fonnie Mu, NP  ?albuterol (PROVENTIL) (2.5 MG/3ML) 0.083% nebulizer solution Take 3 mLs (2.5 mg total) by nebulization every 4 (four) hours as needed for wheezing or shortness of breath. 05/17/21   Tanda Rockers, MD  ?ALPRAZolam Duanne Moron) 0.5 MG tablet Take 1 tablet (0.5 mg total) by mouth 2 (two) times daily as needed for anxiety. ?Patient taking differently: Take 0.5 mg by mouth at bedtime. 07/31/15   Theodis Blaze, MD  ?atorvastatin (LIPITOR) 40 MG tablet Take 40 mg by mouth at bedtime.    [provider]  ?azithromycin (ZITHROMAX) 250 MG tablet Take 2 tablets (500 mg total) by mouth daily for 1 day, THEN 1 tablet (250 mg total) daily for 4 days. 08/15/21 08/20/21  Patrecia Pour, MD  ?Biotin 5000 MCG CAPS Take 5,000 mcg by mouth daily.    [provider]  ?budesonide (PULMICORT) 0.5 MG/2ML nebulizer solution USE 1 VIAL IN NEBULIZER IN  THE MORNING AND AT BEDTIME 03/29/21   Tanda Rockers, MD  ?diltiazem (CARDIZEM CD) 240 MG 24 hr capsule TAKE 1 CAPSULE BY MOUTH DAILY 07/27/21   Jerline Pain, MD  ?divalproex (DEPAKOTE ER) 500 MG 24 hr tablet TAKE 1 TABLET BY MOUTH AT  BEDTIME 08/08/21   Marcial Pacas, MD  ?ferrous sulfate 325 (65 FE) MG tablet 325 mg daily with breakfast. 07/15/21   [provider]  ?fluticasone (FLONASE) 50 MCG/ACT nasal spray Place 2 sprays into both nostrils daily as needed for allergies.    [provider]  ?formoterol (PERFOROMIST) 20 MCG/2ML nebulizer solution USE 1 VIAL  IN  NEBULIZER TWICE  DAILY - morning and evening 03/29/21   Tanda Rockers, MD  ?glipiZIDE (GLUCOTROL) 5 MG tablet Take 5 mg by mouth daily before breakfast.    [provider]  ?hydrALAZINE (APRESOLINE) 100 MG tablet TAKE 1 TABLET BY MOUTH 3 TIMES  DAILY 07/27/21   Jerline Pain, MD  ?ketoconazole (NIZORAL) 2 % cream Apply to both feet and between toes once daily  for 6 weeks for Athlete's feet. 08/01/21   Marzetta Board, DPM  ?KLOR-CON M20 20 MEQ tablet Take 20 mEq by mouth every other day. 07/17/21   [provider]  ?latanoprost (XALATAN) 0.005 % ophthalmic

## 2021-08-20 NOTE — ED Notes (Signed)
Gave me pt 4 heat packs  ?

## 2021-08-20 NOTE — ED Notes (Addendum)
Patient was assisted to restroom but was unsuccessful to arrive to restroom due to feeling dizzy half way. Patient was seated right away and taken back to bed. Patients was ambulated with pulse ox and 02 levels remained 95-97%. Pure wick has been placed instead.  ?

## 2021-08-20 NOTE — ED Notes (Signed)
Pt hot her tray  ?

## 2021-08-20 NOTE — ED Provider Notes (Signed)
?[facility] ?Provider Note ? ? ?CSN: [id number] ?Arrival date & time: 08/20/21  0110 ? ?  ? ?History ? ?Chief Complaint  ?Patient presents with  ? Shortness of Breath  ? Asthma  ? ? ?[name] is a 71 y.o. female. ? ?The history is provided by the patient, a relative and medical records.  ?Shortness of Breath ?Asthma ?Associated symptoms include shortness of breath.  ?[name] is a 71 y.o. female who presents to the Emergency Department complaining of sob.  She presents to the emergency department accompanied by her daughter for evaluation of shortness of breath.  She was recently admitted to the hospital for asthma exacerbation and discharged the subsequent day.  She has been compliant with her medications and when she saw her PCP yesterday she commented that she was still wheezing.  She woke up 1 hour prior to ED arrival and states that her breathing has significantly worsened.  She has been using her home nebulizers without significant improvement in symptoms.  No associated fever.  She does have mild cough that is nonproductive.  She has been experiencing chest pain since last night that is described as a pressure and constant sensation.  Her pain is worse with breathing.  She also reports mild lower extremity edema-this is a chronic issue that comes and goes for her. ? ? ? ?Has a hx/o CHF, COPD, vocal cord dysfunction.   ? ? ?  ? ?Home Medications ?Prior to Admission medications   ?Medication Sig Start Date End Date Taking? Authorizing Provider  ?acetaminophen-codeine (TYLENOL #3) 300-30 MG tablet Take 1 tablet by mouth every 6 (six) hours as needed for moderate pain or severe pain. 07/23/21   [provider]  ?albuterol (PROAIR HFA) 108 (90 Base) MCG/ACT inhaler Inhale 2 puffs into the lungs every 4 (four) hours as needed for wheezing or shortness of breath. 04/20/21   Parrett, Fonnie Mu, NP  ?albuterol (PROVENTIL) (2.5 MG/3ML) 0.083% nebulizer solution Take 3  mLs (2.5 mg total) by nebulization every 4 (four) hours as needed for wheezing or shortness of breath. 05/17/21   Tanda Rockers, MD  ?ALPRAZolam Duanne Moron) 0.5 MG tablet Take 1 tablet (0.5 mg total) by mouth 2 (two) times daily as needed for anxiety. ?Patient taking differently: Take 0.5 mg by mouth at bedtime. 07/31/15   Theodis Blaze, MD  ?atorvastatin (LIPITOR) 40 MG tablet Take 40 mg by mouth at bedtime.    [provider]  ?azithromycin (ZITHROMAX) 250 MG tablet Take 2 tablets (500 mg total) by mouth daily for 1 day, THEN 1 tablet (250 mg total) daily for 4 days. 08/15/21 08/20/21  Patrecia Pour, MD  ?Biotin 5000 MCG CAPS Take 5,000 mcg by mouth daily.    [provider]  ?budesonide (PULMICORT) 0.5 MG/2ML nebulizer solution USE 1 VIAL IN NEBULIZER IN THE MORNING AND AT BEDTIME 03/29/21   Tanda Rockers, MD  ?diltiazem (CARDIZEM CD) 240 MG 24 hr capsule TAKE 1 CAPSULE BY MOUTH DAILY 07/27/21   Jerline Pain, MD  ?divalproex (DEPAKOTE ER) 500 MG 24 hr tablet TAKE 1 TABLET BY MOUTH AT  BEDTIME 08/08/21   Marcial Pacas, MD  ?ferrous sulfate 325 (65 FE) MG tablet 325 mg daily with breakfast. 07/15/21   [provider]  ?fluticasone (FLONASE) 50 MCG/ACT nasal spray Place 2 sprays into both nostrils daily as needed for allergies.    [provider]  ?formoterol (PERFOROMIST) 20 MCG/2ML nebulizer solution USE  1 VIAL  IN  NEBULIZER TWICE  DAILY - morning and evening 03/29/21   Tanda Rockers, MD  ?glipiZIDE (GLUCOTROL) 5 MG tablet Take 5 mg by mouth daily before breakfast.    [provider]  ?hydrALAZINE (APRESOLINE) 100 MG tablet TAKE 1 TABLET BY MOUTH 3 TIMES  DAILY 07/27/21   Jerline Pain, MD  ?ketoconazole (NIZORAL) 2 % cream Apply to both feet and between toes once daily for 6 weeks for Athlete's feet. 08/01/21   Marzetta Board, DPM  ?KLOR-CON M20 20 MEQ tablet Take 20 mEq by mouth every other day. 07/17/21   [provider]  ?latanoprost (XALATAN) 0.005 %  ophthalmic solution Place 1 drop into both eyes at bedtime. 02/05/20   [provider]  ?magnesium oxide (MAG-OX) 400 (240 Mg) MG tablet Take 1 tablet (400 mg total) by mouth daily. ?Patient not taking: Reported on 08/14/2021 05/14/21   Lavina Hamman, MD  ?memantine (NAMENDA) 10 MG tablet Take 1 tablet (10 mg total) by mouth 2 (two) times daily. 03/15/21   Marcial Pacas, MD  ?metFORMIN (GLUCOPHAGE) 1000 MG tablet Take 1,000 mg by mouth 2 (two) times daily with a meal.    [provider]  ?mometasone-formoterol (DULERA) 100-5 MCG/ACT AERO Inhale 2 puffs into the lungs 2 (two) times daily. ?Patient taking differently: Inhale 2 puffs into the lungs 2 (two) times daily as needed for wheezing or shortness of breath. 06/24/21   Aline August, MD  ?montelukast (SINGULAIR) 10 MG tablet Take 1 tablet (10 mg total) by mouth at bedtime. 05/25/21   Tanda Rockers, MD  ?Multiple Vitamin (MULTIVITAMIN WITH MINERALS) TABS tablet Take 1 tablet by mouth daily with breakfast.    [provider]  ?nebivolol (BYSTOLIC) 10 MG tablet Take 1 tablet (10 mg total) by mouth daily. 04/10/19   Jerline Pain, MD  ?nitroGLYCERIN (NITROSTAT) 0.4 MG SL tablet Place 1 tablet (0.4 mg total) under the tongue every 5 (five) minutes as needed for chest pain. Don't exceed more than 3 doses in 15 mins 06/24/21   Aline August, MD  ?pantoprazole (PROTONIX) 40 MG tablet Take 1 tablet (40 mg total) by mouth 2 (two) times daily before a meal. 06/24/21   Aline August, MD  ?Probiotic Product (EQ PROBIOTIC) CAPS Take 1 capsule by mouth every morning.    [provider]  ?   ? ?Allergies    ?Chlorthalidone and Tramadol   ? ?Review of Systems   ?Review of Systems  ?Respiratory:  Positive for shortness of breath.   ?All other systems reviewed and are negative. ? ?Physical Exam ?Updated Vital Signs ?BP (!) 166/81   Pulse (!) 109   Temp 98 ?F (36.7 ?C) (Oral)   Resp 15   Ht '5\' 2"'$  (1.575 m)   Wt 77.1 kg   LMP  (LMP Unknown)    SpO2 98%   BMI 31.09 kg/m?  ?Physical Exam ?Vitals and nursing note reviewed.  ?Constitutional:   ?   General: She is in acute distress.  ?   Appearance: She is well-developed. She is ill-appearing.  ?HENT:  ?   Head: Normocephalic and atraumatic.  ?   Nose: Nose normal.  ?   Mouth/Throat:  ?   Mouth: Mucous membranes are moist.  ?   Pharynx: No oropharyngeal exudate or posterior oropharyngeal erythema.  ?Cardiovascular:  ?   Rate and Rhythm: Normal rate and regular rhythm.  ?   Heart sounds: No murmur heard. ?Pulmonary:  ?  Effort: Respiratory distress present.  ?   Comments: Decreased air movement bilaterally.  Noisy breathing with transmitted upper airway sounds on respirations. ?Abdominal:  ?   Palpations: Abdomen is soft.  ?   Tenderness: There is no abdominal tenderness. There is no guarding or rebound.  ?Musculoskeletal:     ?   General: No swelling or tenderness.  ?   Comments: 2+ DP pulses bilaterally  ?Skin: ?   General: Skin is warm and dry.  ?Neurological:  ?   Mental Status: She is alert and oriented to person, place, and time.  ?Psychiatric:     ?   Behavior: Behavior normal.  ? ? ?ED Results / Procedures / Treatments   ?Labs ?(all labs ordered are listed, but only abnormal results are displayed) ?Labs Reviewed  ?COMPREHENSIVE METABOLIC PANEL - Abnormal; Notable for the following components:  ?    Result Value  ? Potassium 5.3 (*)   ? Glucose, Bld 250 (*)   ? BUN 31 (*)   ? Creatinine, Ser 1.56 (*)   ? GFR, Estimated 35 (*)   ? All other components within normal limits  ?CBC WITH DIFFERENTIAL/PLATELET - Abnormal; Notable for the following components:  ? WBC 12.1 (*)   ? Neutro Abs 9.9 (*)   ? Abs Immature Granulocytes 0.08 (*)   ? All other components within normal limits  ?BLOOD GAS, VENOUS - Abnormal; Notable for the following components:  ? pCO2, Ven 40 (*)   ? pO2, Ven 93 (*)   ? All other components within normal limits  ?BRAIN NATRIURETIC PEPTIDE  ?D-DIMER, QUANTITATIVE  ?TROPONIN I (HIGH  SENSITIVITY)  ?TROPONIN I (HIGH SENSITIVITY)  ? ? ?EKG ?None ? ?Radiology ?DG Chest Port 1 View ? ?Result Date: 08/20/2021 ?CLINICAL DATA:  Shortness of breath EXAM: PORTABLE CHEST 1 VIEW COMPARISON:  08/14/2021 FINDINGS:

## 2021-08-21 DIAGNOSIS — J383 Other diseases of vocal cords: Secondary | ICD-10-CM | POA: Diagnosis present

## 2021-08-21 DIAGNOSIS — I252 Old myocardial infarction: Secondary | ICD-10-CM | POA: Diagnosis not present

## 2021-08-21 DIAGNOSIS — E785 Hyperlipidemia, unspecified: Secondary | ICD-10-CM | POA: Diagnosis present

## 2021-08-21 DIAGNOSIS — J45901 Unspecified asthma with (acute) exacerbation: Secondary | ICD-10-CM | POA: Diagnosis present

## 2021-08-21 DIAGNOSIS — I2510 Atherosclerotic heart disease of native coronary artery without angina pectoris: Secondary | ICD-10-CM | POA: Diagnosis present

## 2021-08-21 DIAGNOSIS — Z833 Family history of diabetes mellitus: Secondary | ICD-10-CM | POA: Diagnosis not present

## 2021-08-21 DIAGNOSIS — F418 Other specified anxiety disorders: Secondary | ICD-10-CM | POA: Diagnosis not present

## 2021-08-21 DIAGNOSIS — Z8249 Family history of ischemic heart disease and other diseases of the circulatory system: Secondary | ICD-10-CM | POA: Diagnosis not present

## 2021-08-21 DIAGNOSIS — Z825 Family history of asthma and other chronic lower respiratory diseases: Secondary | ICD-10-CM | POA: Diagnosis not present

## 2021-08-21 DIAGNOSIS — K5900 Constipation, unspecified: Secondary | ICD-10-CM | POA: Diagnosis present

## 2021-08-21 DIAGNOSIS — Z7984 Long term (current) use of oral hypoglycemic drugs: Secondary | ICD-10-CM | POA: Diagnosis not present

## 2021-08-21 DIAGNOSIS — Z79899 Other long term (current) drug therapy: Secondary | ICD-10-CM | POA: Diagnosis not present

## 2021-08-21 DIAGNOSIS — I5032 Chronic diastolic (congestive) heart failure: Secondary | ICD-10-CM | POA: Diagnosis present

## 2021-08-21 DIAGNOSIS — Z90710 Acquired absence of both cervix and uterus: Secondary | ICD-10-CM | POA: Diagnosis not present

## 2021-08-21 DIAGNOSIS — F03A3 Unspecified dementia, mild, with mood disturbance: Secondary | ICD-10-CM | POA: Diagnosis present

## 2021-08-21 DIAGNOSIS — E1165 Type 2 diabetes mellitus with hyperglycemia: Secondary | ICD-10-CM | POA: Diagnosis present

## 2021-08-21 DIAGNOSIS — Z20822 Contact with and (suspected) exposure to covid-19: Secondary | ICD-10-CM | POA: Diagnosis present

## 2021-08-21 DIAGNOSIS — I130 Hypertensive heart and chronic kidney disease with heart failure and stage 1 through stage 4 chronic kidney disease, or unspecified chronic kidney disease: Secondary | ICD-10-CM | POA: Diagnosis present

## 2021-08-21 DIAGNOSIS — K219 Gastro-esophageal reflux disease without esophagitis: Secondary | ICD-10-CM | POA: Diagnosis present

## 2021-08-21 DIAGNOSIS — J441 Chronic obstructive pulmonary disease with (acute) exacerbation: Secondary | ICD-10-CM | POA: Diagnosis present

## 2021-08-21 DIAGNOSIS — Z87891 Personal history of nicotine dependence: Secondary | ICD-10-CM | POA: Diagnosis not present

## 2021-08-21 DIAGNOSIS — N1831 Chronic kidney disease, stage 3a: Secondary | ICD-10-CM | POA: Diagnosis present

## 2021-08-21 DIAGNOSIS — E1142 Type 2 diabetes mellitus with diabetic polyneuropathy: Secondary | ICD-10-CM | POA: Diagnosis present

## 2021-08-21 DIAGNOSIS — F03A4 Unspecified dementia, mild, with anxiety: Secondary | ICD-10-CM | POA: Diagnosis present

## 2021-08-21 DIAGNOSIS — E1122 Type 2 diabetes mellitus with diabetic chronic kidney disease: Secondary | ICD-10-CM | POA: Diagnosis present

## 2021-08-21 LAB — GLUCOSE, CAPILLARY
Glucose-Capillary: 181 mg/dL — ABNORMAL HIGH (ref 70–99)
Glucose-Capillary: 307 mg/dL — ABNORMAL HIGH (ref 70–99)
Glucose-Capillary: 308 mg/dL — ABNORMAL HIGH (ref 70–99)

## 2021-08-21 LAB — BASIC METABOLIC PANEL
Anion gap: 7 (ref 5–15)
BUN: 27 mg/dL — ABNORMAL HIGH (ref 8–23)
CO2: 25 mmol/L (ref 22–32)
Calcium: 9.1 mg/dL (ref 8.9–10.3)
Chloride: 104 mmol/L (ref 98–111)
Creatinine, Ser: 1.18 mg/dL — ABNORMAL HIGH (ref 0.44–1.00)
GFR, Estimated: 49 mL/min — ABNORMAL LOW (ref >60)
Glucose, Bld: 227 mg/dL — ABNORMAL HIGH (ref 70–99)
Potassium: 3.9 mmol/L (ref 3.5–5.1)
Sodium: 136 mmol/L (ref 135–145)

## 2021-08-21 MED ORDER — HYDRALAZINE HCL 20 MG/ML IJ SOLN: 5.0000 mg | Freq: Four times a day (QID) | INTRAMUSCULAR | Status: DC | PRN

## 2021-08-21 MED ORDER — ARFORMOTEROL TARTRATE 15 MCG/2ML IN NEBU
15.0000 ug | INHALATION_SOLUTION | Freq: Two times a day (BID) | RESPIRATORY_TRACT | Status: DC
  Administered 2021-08-21 – 2021-08-24 (×7): 15 ug via RESPIRATORY_TRACT
  Filled 2021-08-21 (×7): qty 2

## 2021-08-21 MED ORDER — ALPRAZOLAM 0.5 MG PO TABS
0.5000 mg | ORAL_TABLET | Freq: Two times a day (BID) | ORAL | Status: DC | PRN
  Administered 2021-08-21 – 2021-08-23 (×5): 0.5 mg via ORAL
  Filled 2021-08-21 (×5): qty 1

## 2021-08-21 MED ORDER — METHYLPREDNISOLONE SODIUM SUCC 125 MG IJ SOLR
60.0000 mg | Freq: Two times a day (BID) | INTRAMUSCULAR | Status: DC
  Administered 2021-08-21 – 2021-08-22 (×3): 60 mg via INTRAVENOUS
  Filled 2021-08-21 (×3): qty 2

## 2021-08-21 MED ORDER — INSULIN GLARGINE-YFGN 100 UNIT/ML ~~LOC~~ SOLN
10.0000 [IU] | Freq: Every day | SUBCUTANEOUS | Status: DC
  Administered 2021-08-21 – 2021-08-22 (×2): 10 [IU] via SUBCUTANEOUS
  Filled 2021-08-21 (×3): qty 0.1

## 2021-08-21 MED ORDER — GUAIFENESIN ER 600 MG PO TB12
1200.0000 mg | ORAL_TABLET | Freq: Two times a day (BID) | ORAL | Status: DC
  Administered 2021-08-21 – 2021-08-24 (×7): 1200 mg via ORAL
  Filled 2021-08-21 (×7): qty 2

## 2021-08-21 NOTE — Progress Notes (Signed)
?PROGRESS NOTE ? ? ? ?[name]  BOF:[id number] DOB: Dec 25, 1950 DOA: 08/20/2021 ?PCP: [name], MD  ? ?Brief Narrative: ?71 year old with past medical history significant for anxiety, depression, mild dementia, mild cognitive impairment, chronic diastolic heart failure, CAD, diabetes type 2, GERD, history of seizure, hypertension, history of non-STEMI, CKD stage IIIa, class I obesity with current BMI 31 who was recently admitted and discharged due to COPD exacerbation presents back to the ED complaining of worsening shortness of breath, nonproductive cough, rhinorrhea and sinus pressure.  She has been having a lot of issues with allergies.  She has been compliant with her medication.  She completed azithromycin and prednisone course.  She complained of chest pain. ? ?Evaluation in the ED chest x-ray showed normal heart size with clear lungs. ?BNP 24, normal D-dimer, troponin x2 negative. ? ?Patient was admitted with asthma/COPD exacerbation ? ?Assessment & Plan: ?  ?Principal Problem: ?  COPD with acute exacerbation ([place]) ?Active Problems: ?  Type 2 diabetes mellitus with hyperglycemia ([place]) ?  Depression with anxiety ?  Essential hypertension ?  Vocal cord dysfunction ?  GERD (gastroesophageal reflux disease) ?  Hyperlipidemia ?  Asthma exacerbation ?  CAD in native artery ?  Chronic diastolic CHF (congestive heart failure) ([place]) ?  Chronic kidney disease, stage 3a ([place]) ?  Polyneuropathy due to type 2 diabetes mellitus ([place]) ? ?1-Asthma/COPD with acute exacerbation: ?Recurrent admission.  ?Plan for IV steroids.  ?Add brovana, continue with Pulmicort, duoneb, montelukast.  ?PPI.  ?Start Guaifenesin and Flutter valve.  ?D dimer 0.4. ? ?2-Diabetes type 2 with hyperglycemia: ?Continue with SSI Insulin ?Hold metformin ?Continue with glipizide ? ?Depression with anxiety: ?Continue with Depakote and Xanax ? ?Hypertension: ?Continue with Cardizem, oral hydralazine. ?We will also add as needed hydralazine ? ?Vocal  cord dysfunction: ?Speech consultation for follow-up ? ?GERD: ?Continue with PPI twice daily ? ?CAD: ?Continue with bystolic, cardizem.  ? ?Chronic diastolic heart failure: ?Compensated.  ?Not on diuretics.  ? ?Hyperkalemia;  ?Awaiting Bmet. Might need lokelma.  ? ?CKD stage III a monitor.  ?Prior Cr 1.3--1.5 ? 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 ? ? ?Estimated body mass index is 31.83 kg/m? as calculated from the following: ?  Height as of this encounter: '5\' 2"'$  (1.575 m). ?  Weight as of this encounter: 78.9 kg. ? ? ?DVT prophylaxis: Lovenox ?Code Status: Full code ?Family Communication: Care discussed with patient ?Disposition Plan:  ?Status is: Observation ?The patient remains OBS appropriate and will d/c before 2 midnights. ? ? ? ?Consultants:  ?None ? ?Procedures:  ?None ? ?Antimicrobials:  ? ? ?Subjective: ?She is breathing a little better. She has had multiples hospitalization for asthma exacerbation. She presented with because of SOB and wheezing in her lung.  ? ? ?Objective: ?Vitals:  ? 08/20/21 2242 08/21/21 0347 08/21/21 0400 08/21/21 0622  ?BP: (!) 154/71 (!) 145/73  (!) 163/80  ?Pulse: 99 77  79  ?Resp: '18 18  18  '$ ?Temp: 98.3 ?F (36.8 ?C) 98.6 ?F (37 ?C)  98.2 ?F (36.8 ?C)  ?TempSrc: Oral Oral  Oral  ?SpO2: 100% 92% 97% 95%  ?Weight:      ?Height:      ? ? ?Intake/Output Summary (Last 24 hours) at 08/21/2021 0706 ?Last data filed at 08/21/2021 7782 ?Gross per 24 hour  ?Intake 2279 ml  ?Output 450 ml  ?Net 1829 ml  ? ?Filed Weights  ? 08/20/21 0114 08/20/21 1456  ?Weight: 77.1 kg 78.9 kg  ? ? ?  Examination: ? ?General exam: Appears calm and comfortable  ?Respiratory system: Sporadic wheezing.  ?Cardiovascular system: S1 & S2 heard, RRR. No JVD, murmurs, rubs, gallops or clicks. No pedal edema. ?Gastrointestinal system: Abdomen is nondistended, soft and nontender. No organomegaly or masses felt. Normal bowel sounds heard. ?Central nervous system: Alert and oriented. No focal neurological  deficits. ?Extremities: Symmetric 5 x 5 power. ? ? ? ?Data Reviewed: I have personally reviewed following labs and imaging studies ? ?CBC: ?Recent Labs  ?Lab 08/14/21 ?1431 08/15/21 ?0504 08/20/21 ?0149  ?WBC 10.2 8.7 12.1*  ?NEUTROABS 8.1*  --  9.9*  ?HGB 12.7 11.6* 12.5  ?HCT 38.9 35.8* 38.2  ?MCV 98.2 96.8 97.0  ?PLT 274 233 295  ? ?Basic Metabolic Panel: ?Recent Labs  ?Lab 08/14/21 ?1431 08/15/21 ?0504 08/20/21 ?0149  ?NA 138 139 137  ?K 4.3 4.3 5.3*  ?CL 106 105 105  ?CO2 '25 22 22  '$ ?GLUCOSE 169* 250* 250*  ?BUN 19 26* 31*  ?CREATININE 1.19* 1.19* 1.56*  ?CALCIUM 9.4 9.7 9.5  ? ?GFR: ?Estimated Creatinine Clearance: 31.7 mL/min (A) (by C-G formula based on SCr of 1.56 mg/dL (H)). ?Liver Function Tests: ?Recent Labs  ?Lab 08/15/21 ?4818 08/20/21 ?0149  ?AST 21 29  ?ALT 16 14  ?ALKPHOS 55 53  ?BILITOT 0.7 0.9  ?PROT 7.1 7.5  ?ALBUMIN 3.8 4.0  ? ?No results for input(s): LIPASE, AMYLASE in the last 168 hours. ?No results for input(s): AMMONIA in the last 168 hours. ?Coagulation Profile: ?No results for input(s): INR, PROTIME in the last 168 hours. ?Cardiac Enzymes: ?No results for input(s): CKTOTAL, CKMB, CKMBINDEX, TROPONINI in the last 168 hours. ?BNP (last 3 results) ?No results for input(s): PROBNP in the last 8760 hours. ?HbA1C: ?No results for input(s): HGBA1C in the last 72 hours. ?CBG: ?Recent Labs  ?Lab 08/14/21 ?2155 08/15/21 ?5631 08/20/21 ?1139 08/20/21 ?1551 08/20/21 ?1840  ?GLUCAP 226* 241* 393* 286* 198*  ? ?Lipid Profile: ?No results for input(s): CHOL, HDL, LDLCALC, TRIG, CHOLHDL, LDLDIRECT in the last 72 hours. ?Thyroid Function Tests: ?No results for input(s): TSH, T4TOTAL, FREET4, T3FREE, THYROIDAB in the last 72 hours. ?Anemia Panel: ?No results for input(s): VITAMINB12, FOLATE, FERRITIN, TIBC, IRON, RETICCTPCT in the last 72 hours. ?Sepsis Labs: ?No results for input(s): PROCALCITON, LATICACIDVEN in the last 168 hours. ? ?Recent Results (from the past 240 hour(s))  ?Resp Panel by RT-PCR (Flu  A&B, Covid)     Status: None  ? Collection Time: 08/14/21  2:38 PM  ?Result Value Ref Range Status  ? SARS Coronavirus 2 by RT PCR NEGATIVE NEGATIVE Final  ?  Comment: (NOTE) ?SARS-CoV-2 target nucleic acids are NOT DETECTED. ? ?The SARS-CoV-2 RNA is generally detectable in upper respiratory ?specimens during the acute phase of infection. The lowest ?concentration of SARS-CoV-2 viral copies this assay can detect is ?138 copies/mL. A negative result does not preclude SARS-Cov-2 ?infection and should not be used as the sole basis for treatment or ?other patient management decisions. A negative result may occur with  ?improper specimen collection/handling, submission of specimen other ?than nasopharyngeal swab, presence of viral mutation(s) within the ?areas targeted by this assay, and inadequate number of viral ?copies(<138 copies/mL). A negative result must be combined with ?clinical observations, patient history, and epidemiological ?information. The expected result is Negative. ? ?Fact Sheet for Patients:  ?EntrepreneurPulse.com.au ? ?Fact Sheet for Healthcare Providers:  ?IncredibleEmployment.be ? ?This test is no t yet approved or cleared by the [place] and  ?has been authorized  for detection and/or diagnosis of SARS-CoV-2 by ?FDA under an Emergency Use Authorization (EUA). This EUA will remain  ?in effect (meaning this test can be used) for the duration of the ?COVID-19 declaration under Section 564(b)(1) of the Act, 21 ?U.S.C.section 360bbb-3(b)(1), unless the authorization is terminated  ?or revoked sooner.  ? ? ?  ? Influenza A by PCR NEGATIVE NEGATIVE Final  ? Influenza B by PCR NEGATIVE NEGATIVE Final  ?  Comment: (NOTE) ?The Xpert Xpress SARS-CoV-2/FLU/RSV plus assay is intended as an aid ?in the diagnosis of influenza from Nasopharyngeal swab specimens and ?should not be used as a sole basis for treatment. Nasal washings and ?aspirates are unacceptable for  Xpert Xpress SARS-CoV-2/FLU/RSV ?testing. ? ?Fact Sheet for Patients: ?EntrepreneurPulse.com.au ? ?Fact Sheet for Healthcare Providers: ?IncredibleEmployment.be ? ?This test is not ye

## 2021-08-22 ENCOUNTER — Telehealth: Payer: Self-pay | Admitting: *Deleted

## 2021-08-22 ENCOUNTER — Ambulatory Visit: Payer: Medicare Other

## 2021-08-22 DIAGNOSIS — J441 Chronic obstructive pulmonary disease with (acute) exacerbation: Secondary | ICD-10-CM | POA: Diagnosis not present

## 2021-08-22 LAB — GLUCOSE, CAPILLARY
Glucose-Capillary: 369 mg/dL — ABNORMAL HIGH (ref 70–99)
Glucose-Capillary: 247 mg/dL — ABNORMAL HIGH (ref 70–99)
Glucose-Capillary: 171 mg/dL — ABNORMAL HIGH (ref 70–99)
Glucose-Capillary: 452 mg/dL — ABNORMAL HIGH (ref 70–99)

## 2021-08-22 MED ORDER — PREDNISONE 20 MG PO TABS
40.0000 mg | ORAL_TABLET | Freq: Every day | ORAL | Status: AC
  Administered 2021-08-23 – 2021-08-24 (×2): 40 mg via ORAL
  Filled 2021-08-22 (×2): qty 2

## 2021-08-22 MED ORDER — INSULIN GLARGINE-YFGN 100 UNIT/ML ~~LOC~~ SOLN
5.0000 [IU] | Freq: Once | SUBCUTANEOUS | Status: AC
  Administered 2021-08-22: 5 [IU] via SUBCUTANEOUS
  Filled 2021-08-22: qty 0.05

## 2021-08-22 MED ORDER — PREDNISONE 20 MG PO TABS: 20.0000 mg | ORAL_TABLET | Freq: Every day | ORAL | Status: DC

## 2021-08-22 MED ORDER — INSULIN GLARGINE-YFGN 100 UNIT/ML ~~LOC~~ SOLN
15.0000 [IU] | Freq: Every day | SUBCUTANEOUS | Status: DC
  Administered 2021-08-23 – 2021-08-24 (×2): 15 [IU] via SUBCUTANEOUS
  Filled 2021-08-22 (×2): qty 0.15

## 2021-08-22 MED ORDER — PREDNISONE 5 MG PO TABS: 10.0000 mg | ORAL_TABLET | Freq: Every day | ORAL | Status: DC

## 2021-08-22 MED ORDER — PREDNISONE 5 MG PO TABS: 30.0000 mg | ORAL_TABLET | Freq: Every day | ORAL | Status: DC

## 2021-08-22 MED ORDER — SUCRALFATE 1 G PO TABS
1.0000 g | ORAL_TABLET | Freq: Three times a day (TID) | ORAL | Status: DC
  Administered 2021-08-22 – 2021-08-24 (×8): 1 g via ORAL
  Filled 2021-08-22 (×8): qty 1

## 2021-08-22 MED ORDER — SUCRALFATE 1 G PO TABS: 1.0000 g | ORAL_TABLET | Freq: Three times a day (TID) | ORAL | Status: DC

## 2021-08-22 NOTE — Telephone Encounter (Signed)
Sent this information to pt via MyChart.  She responded asking what she should do about the medication since she has been in the hospital since Friday.  Advised since she is currently admitted, I will have Dr Marlou Porch to review and contact with further information. ?

## 2021-08-22 NOTE — Progress Notes (Addendum)
Inpatient Diabetes Program Recommendations ? ?AACE/ADA: New Consensus Statement on Inpatient Glycemic Control (2015) ? ?Target Ranges:  Prepandial:   less than 140 mg/dL ?     Peak postprandial:   less than 180 mg/dL (1-2 hours) ?     Critically ill patients:  140 - 180 mg/dL  ? ?Lab Results  ?Component Value Date  ? GLUCAP 369 (H) 08/22/2021  ? HGBA1C 8.2 (H) 06/05/2021  ? ? ?Review of Glycemic Control ? Latest Reference Range & Units 08/21/21 16:54 08/21/21 20:20 08/22/21 07:46  ?Glucose-Capillary 70 - 99 mg/dL 307 (H) 308 (H) 369 (H)  ?(H): Data is abnormally high ?Diabetes history: Type 2 Dm ?Outpatient Diabetes medications: Metformin 1000 mg BID, Glipizide 5 mg QD ?Current orders for Inpatient glycemic control: Novolog 0-20 units TID, glipizide 5 mg QD, Semglee 10 units QD ?Solumedrol 60 mg BID ? ?Inpatient Diabetes Program Recommendations:   ? ?Consider increasing Semglee 16 units QD and adding Novolog 0-5 units QHS and Novolog 4 units TID (assuming patient is consuming >50% of meals).  ? ?Thanks, ?Bronson Curb, MSN, RNC-OB ?Diabetes Coordinator ?[phone] (8a-5p) ? ? ? ? ?

## 2021-08-22 NOTE — Evaluation (Signed)
Physical Therapy Evaluation ?Patient Details ?Name: [name] ?MRN: [id number] ?DOB: Jul 16, 1950 ?Today's Date: 08/22/2021 ? ?History of Present Illness ? Pt. is a 71 y.o. F presenting to [facility] on 4/22 complaining of worsening SOB, nonproductive cough, rhinorrhea, and sinus pressure. PMH significant for anxiety, depression, mild dementia, mild cognitive impairment, chronic diastolic heart failure, CAD, diabetes type 2, GERD, history of seizure, hypertension, history of non-STEMI, CKD stage IIIa, class I obesity with current BMI 31. Recently admitted 4/16-17 with respiratory distress.  ?Clinical Impression ? Pt presents with decreased functional strength, balance, and endurance secondary to diagnosis above. These impairments are limiting her ability to safely and independently transfer, get into her home, perform all adls/iadls, and ambulate in the community. Pt to benefit from acute PT to address deficits. Functional transfers and short distance ambulation performed and pt ambulated 80 feet with no AD. Pt. Responded well and maintained 02 saturation >95% on RA (left on RA per instruction of RN) but was limited secondary to fatigue. SPT recommends outpatient follow up for further balance, strength, and functional endurance training once medically stable for d/c. PT to progress mobility as tolerated, and will continue to follow acutely.  ?   ?   ? ?Recommendations for follow up therapy are one component of a multi-disciplinary discharge planning process, led by the attending physician.  Recommendations may be updated based on patient status, additional functional criteria and insurance authorization. ? ?Follow Up Recommendations Outpatient PT ? ?  ?Assistance Recommended at Discharge PRN  ?Patient can return home with the following ? A little help with walking and/or transfers;A little help with bathing/dressing/bathroom;Assistance with cooking/housework ? ?  ?Equipment Recommendations None recommended by PT   ?Recommendations for Other Services ?    ?  ?Functional Status Assessment Patient has had a recent decline in their functional status and demonstrates the ability to make significant improvements in function in a reasonable and predictable amount of time.  ? ?  ?Precautions / Restrictions Precautions ?Precautions: Fall ?Precaution Comments: Watch O2 saturation ?Restrictions ?Weight Bearing Restrictions: No  ? ?  ? ?Mobility ? Bed Mobility ?Overal bed mobility: Modified Independent ?  ?  ?  ?  ?  ?  ?General bed mobility comments: Able to sit EOB without difficulty ?  ? ?Transfers ?Overall transfer level: Needs assistance ?Equipment used: None ?Transfers: Sit to/from Stand ?Sit to Stand: Supervision ?  ?  ?  ?  ?  ?  ?  ? ?Ambulation/Gait ?Ambulation/Gait assistance: Supervision ?Gait Distance (Feet): 80 Feet ?Assistive device: None ?Gait Pattern/deviations: Decreased stride length, Drifts right/left ?Gait velocity: decreased ?  ?  ?General Gait Details: Pt. requires multiple standing rest breaks during gait, however O2 sats >95% the whole time. She reports mild dizziness that does not worsen at the beginning of gait. She regullary reaches out to the wall and/or handrails for support and does not want to use an AD. ? ? ? ?  ? ?Balance Overall balance assessment: Needs assistance ?Sitting-balance support: No upper extremity supported ?Sitting balance-Leahy Scale: Good ?Sitting balance - Comments: Able to sit in chair and bed reaching moderately out of BOS w/o issues ?  ?  ?Standing balance-Leahy Scale: Fair ?Standing balance comment: She is able to walk w/o an AD for short distances but regullary reaches out the the wall or other objects for support during dynamic movements. She would not tolerate further balance challenges. ?  ?  ?  ?  ?  ?  ?  ?  ?  ?  ?  ?   ? ? ? ?  Pertinent Vitals/Pain Pain Assessment ?Pain Assessment: 0-10 ?Pain Score: 8  ?Pain Location: chest when coughing ?Pain Descriptors / Indicators:  Sore ?Pain Intervention(s): Limited activity within patient's tolerance, Monitored during session  ? ? ?Home Living Family/patient expects to be discharged to:: Private residence ?Living Arrangements: Spouse/significant other ?Available Help at Discharge: Family;Available 24 hours/day (Limited physical assist) ?Type of Home: House ?Home Access: Stairs to enter ?Entrance Stairs-Rails: Right;Left;Can reach both ?Entrance Stairs-Number of Steps: 3 ?  ?Home Layout: One level ?Home Equipment: Counsellor (2 wheels) ?Additional Comments: Lives with husband, grandchildren (85) and great-grandchildren (17 and 26 y.o.)  ?  ?Prior Function Prior Level of Function : Independent/Modified Independent;Driving ?  ?  ?  ?  ?  ?  ?Mobility Comments: no devices needed ?ADLs Comments: all independent ?  ? ? ?Hand Dominance  ? Dominant Hand: Left ? ?  ?Extremity/Trunk Assessment  ? Upper Extremity Assessment ?Upper Extremity Assessment: Overall WFL for tasks assessed ?  ? ?Lower Extremity Assessment ?Lower Extremity Assessment: Generalized weakness ?  ? ?Cervical / Trunk Assessment ?Cervical / Trunk Assessment: Normal  ?Communication  ? Communication: No difficulties  ?Cognition Arousal/Alertness: Awake/alert ?Behavior During Therapy: [facility] for tasks assessed/performed ?Overall Cognitive Status: Within Functional Limits for tasks assessed ?  ?  ?  ?  ?  ?  ?  ?  ?  ?  ?  ?  ?  ?  ?  ?  ?General Comments: Self Limiting ?  ?  ? ?  ?General Comments General comments (skin integrity, edema, etc.): Pt. reports being tired during gait, O2 stats remained stable and >95% during session. ? ?  ?   ? ?Assessment/Plan  ?  ?PT Assessment Patient needs continued PT services  ?PT Problem List Decreased strength;Decreased mobility;Decreased safety awareness;Decreased range of motion;Decreased knowledge of precautions;Decreased activity tolerance;Decreased balance;Decreased knowledge of use of DME ? ?   ?  ?PT Treatment Interventions DME  instruction;Therapeutic exercise;Gait training;Balance training;Stair training;Neuromuscular re-education;Functional mobility training;Therapeutic activities;Patient/family education   ? ?PT Goals (Current goals can be found in the Care Plan section)  ?Acute Rehab PT Goals ?Patient Stated Goal: Improve endurance and return home. ?PT Goal Formulation: With patient ?Time For Goal Achievement: 09/05/21 ?Potential to Achieve Goals: Good ?Additional Goals ?Additional Goal #1: Pt. will perform TUG in <12 sec to show decreased risk of falls. ? ?  ?Frequency Min 3X/week ?  ? ? ?   ?AM-PAC PT "6 Clicks" Mobility  ?Outcome Measure Help needed turning from your back to your side while in a flat bed without using bedrails?: None ?Help needed moving from lying on your back to sitting on the side of a flat bed without using bedrails?: None ?Help needed moving to and from a bed to a chair (including a wheelchair)?: None ?Help needed standing up from a chair using your arms (e.g., wheelchair or bedside chair)?: A Little ?Help needed to walk in hospital room?: A Little ?Help needed climbing 3-5 steps with a railing? : A Little ?6 Click Score: 21 ? ?  ?End of Session Equipment Utilized During Treatment: Gait belt ?Activity Tolerance: Patient limited by fatigue ?Patient left: in chair;with call bell/phone within reach ?Nurse Communication: Mobility status ?PT Visit Diagnosis: Unsteadiness on feet (R26.81);Muscle weakness (generalized) (M62.81);Other abnormalities of gait and mobility (R26.89) ?  ? ?Time: 5093-2671 ?PT Time Calculation (min) (ACUTE ONLY): 23 min ? ? ?Charges:   PT Evaluation ?$PT Eval Low Complexity: 1 Low ?  ?  ?   ? ? ?  Thermon Leyland, SPT ?Acute Rehab Services ? ? ?Thermon Leyland ?08/22/2021, 11:53 AM ? ?

## 2021-08-22 NOTE — Progress Notes (Signed)
?PROGRESS NOTE ? ? ? ?[name]  QIW:[id number] DOB: 1951-04-16 DOA: 08/20/2021 ?PCP: [name], MD  ? ?Brief Narrative: ?71 year old with past medical history significant for anxiety, depression, mild dementia, mild cognitive impairment, chronic diastolic heart failure, CAD, diabetes type 2, GERD, history of seizure, hypertension, history of non-STEMI, CKD stage IIIa, class I obesity with current BMI 31 who was recently admitted and discharged due to COPD exacerbation presents back to the ED complaining of worsening shortness of breath, nonproductive cough, rhinorrhea and sinus pressure.  She has been having a lot of issues with allergies.  She has been compliant with her medication.  She completed azithromycin and prednisone course.  She complained of chest pain. ? ?Evaluation in the ED chest x-ray showed normal heart size with clear lungs. ?BNP 24, normal D-dimer, troponin x2 negative. ? ?Patient was admitted with asthma/COPD exacerbation ? ?Assessment & Plan: ?  ?Principal Problem: ?  COPD with acute exacerbation ([place]) ?Active Problems: ?  Type 2 diabetes mellitus with hyperglycemia ([place]) ?  Depression with anxiety ?  Essential hypertension ?  Vocal cord dysfunction ?  GERD (gastroesophageal reflux disease) ?  Hyperlipidemia ?  Asthma exacerbation ?  CAD in native artery ?  Chronic diastolic CHF (congestive heart failure) ([place]) ?  Chronic kidney disease, stage 3a ([place]) ?  Polyneuropathy due to type 2 diabetes mellitus ([place]) ? ?1-Asthma/COPD with acute exacerbation: ?Recurrent admission.  ?Received IV steroids, Transition to prednisone by pulmonologist 4/24. ?Continue with brovana, continue with Pulmicort, duoneb, montelukast.  ?PPI.  ?Started Guaifenesin and Flutter valve.  ?D dimer 0.4. ?Pulmonologist consulted. Pulmonologist think asthma exacerbation might be related to uncontrolled reflux. She will need referral to GI.  ? ?2-Diabetes type 2 with hyperglycemia: ?Continue with SSI Insulin ?Hold  metformin ?Continue with glipizide ?CBG elevated in setting steroids. Started Sem[place]lee.  ? ?Depression with anxiety: ?Continue with Depakote and Xanax ? ?Hypertension: ?Continue with Cardizem, oral hydralazine. ?We will also add as needed hydralazine ? ?Vocal cord dysfunction: ?Speech consultation for follow-up ? ?GERD: ?Continue with PPI twice daily ?Will add Carafate.  ?Precaution discussed with patient.  ? ?CAD: ?Continue with bystolic, cardizem.  ? ?Chronic diastolic heart failure: ?Compensated.  ?Not on diuretics.  ? ?Hyperkalemia;  ?Awaiting Bmet. Might need lokelma.  ? ?CKD stage III a monitor.  ?Prior Cr 1.3--1.5 ? 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 ? ? ?Estimated body mass index is 32.81 kg/m? as calculated from the following: ?  Height as of this encounter: '5\' 2"'$  (1.575 m). ?  Weight as of this encounter: 81.4 kg. ? ? ?DVT prophylaxis: Lovenox ?Code Status: Full code ?Family Communication: Care discussed with patient ?Disposition Plan:  ?Status is: Observation ?The patient remains OBS appropriate and will d/c before 2 midnights. ? ? ? ?Consultants:  ?None ? ?Procedures:  ?None ? ?Antimicrobials:  ? ? ?Subjective: ?She is breathing ok, feels her reflux is very bad today.  ?She spoke with pulmonologist about reflux causing recurrent exacerbation.  ? ?Objective: ?Vitals:  ? 08/21/21 2018 08/22/21 0244 08/22/21 0503 08/22/21 4174  ?BP: (!) 149/73  (!) 162/80 (!) 159/77  ?Pulse: 86  78 94  ?Resp: 18  (!) 24 18  ?Temp: 98.2 ?F (36.8 ?C)  97.7 ?F (36.5 ?C)   ?TempSrc: Oral  Oral   ?SpO2: 94% 93% 94% 92%  ?Weight:   81.4 kg   ?Height:      ? ? ?Intake/Output Summary (Last 24 hours) at 08/22/2021 1154 ?Last data filed at 08/22/2021 0900 ?  Gross per 24 hour  ?Intake 240 ml  ?Output 400 ml  ?Net -160 ml  ? ? ?Filed Weights  ? 08/20/21 0114 08/20/21 1456 08/22/21 0503  ?Weight: 77.1 kg 78.9 kg 81.4 kg  ? ? ?Examination: ? ?General exam: NAD ?Respiratory system: BL air movement ?Cardiovascular system: S 1, S 2  RRR ?Gastrointestinal system: BS present, soft nt ?Central nervous system: Non focal.  ?Extremities: Symmetric power ? ? ? ?Data Reviewed: I have personally reviewed following labs and imaging studies ? ?CBC: ?Recent Labs  ?Lab 08/20/21 ?0149  ?WBC 12.1*  ?NEUTROABS 9.9*  ?HGB 12.5  ?HCT 38.2  ?MCV 97.0  ?PLT 295  ? ? ?Basic Metabolic Panel: ?Recent Labs  ?Lab 08/20/21 ?0149 08/21/21 ?0109  ?NA 137 136  ?K 5.3* 3.9  ?CL 105 104  ?CO2 22 25  ?GLUCOSE 250* 227*  ?BUN 31* 27*  ?CREATININE 1.56* 1.18*  ?CALCIUM 9.5 9.1  ? ? ?GFR: ?Estimated Creatinine Clearance: 42.6 mL/min (A) (by C-G formula based on SCr of 1.18 mg/dL (H)). ?Liver Function Tests: ?Recent Labs  ?Lab 08/20/21 ?0149  ?AST 29  ?ALT 14  ?ALKPHOS 53  ?BILITOT 0.9  ?PROT 7.5  ?ALBUMIN 4.0  ? ? ?No results for input(s): LIPASE, AMYLASE in the last 168 hours. ?No results for input(s): AMMONIA in the last 168 hours. ?Coagulation Profile: ?No results for input(s): INR, PROTIME in the last 168 hours. ?Cardiac Enzymes: ?No results for input(s): CKTOTAL, CKMB, CKMBINDEX, TROPONINI in the last 168 hours. ?BNP (last 3 results) ?No results for input(s): PROBNP in the last 8760 hours. ?HbA1C: ?No results for input(s): HGBA1C in the last 72 hours. ?CBG: ?Recent Labs  ?Lab 08/20/21 ?1840 08/21/21 ?1111 08/21/21 ?1654 08/21/21 ?2020 08/22/21 ?0746  ?[place] ? ?Lipid Profile: ?No results for input(s): CHOL, HDL, LDLCALC, TRIG, CHOLHDL, LDLDIRECT in the last 72 hours. ?Thyroid Function Tests: ?No results for input(s): TSH, T4TOTAL, FREET4, T3FREE, THYROIDAB in the last 72 hours. ?Anemia Panel: ?No results for input(s): VITAMINB12, FOLATE, FERRITIN, TIBC, IRON, RETICCTPCT in the last 72 hours. ?Sepsis Labs: ?No results for input(s): PROCALCITON, LATICACIDVEN in the last 168 hours. ? ?Recent Results (from the past 240 hour(s))  ?Resp Panel by RT-PCR (Flu A&B, Covid)     Status: None  ? Collection Time: 08/14/21  2:38 PM  ?Result Value Ref Range  Status  ? SARS Coronavirus 2 by RT PCR NEGATIVE NEGATIVE Final  ?  Comment: (NOTE) ?SARS-CoV-2 target nucleic acids are NOT DETECTED. ? ?The SARS-CoV-2 RNA is generally detectable in upper respiratory ?specimens during the acute phase of infection. The lowest ?concentration of SARS-CoV-2 viral copies this assay can detect is ?138 copies/mL. A negative result does not preclude SARS-Cov-2 ?infection and should not be used as the sole basis for treatment or ?other patient management decisions. A negative result may occur with  ?improper specimen collection/handling, submission of specimen other ?than nasopharyngeal swab, presence of viral mutation(s) within the ?areas targeted by this assay, and inadequate number of viral ?copies(<138 copies/mL). A negative result must be combined with ?clinical observations, patient history, and epidemiological ?information. The expected result is Negative. ? ?Fact Sheet for Patients:  ?EntrepreneurPulse.com.au ? ?Fact Sheet for Healthcare Providers:  ?IncredibleEmployment.be ? ?This test is no t yet approved or cleared by the [place] FDA and  ?has been authorized for detection and/or diagnosis of SARS-CoV-2 by ?FDA under an Emergency Use Authorization (EUA). This EUA will remain  ?in effect (meaning this test  can be used) for the duration of the ?COVID-19 declaration under Section 564(b)(1) of the Act, 21 ?U.S.C.section 360bbb-3(b)(1), unless the authorization is terminated  ?or revoked sooner.  ? ? ?  ? Influenza A by PCR NEGATIVE NEGATIVE Final  ? Influenza B by PCR NEGATIVE NEGATIVE Final  ?  Comment: (NOTE) ?The Xpert Xpress SARS-CoV-2/FLU/RSV plus assay is intended as an aid ?in the diagnosis of influenza from Nasopharyngeal swab specimens and ?should not be used as a sole basis for treatment. Nasal washings and ?aspirates are unacceptable for Xpert Xpress SARS-CoV-2/FLU/RSV ?testing. ? ?Fact Sheet for  Patients: ?EntrepreneurPulse.com.au ? ?Fact Sheet for Healthcare Providers: ?IncredibleEmployment.be ? ?This test is not yet approved or cleared by the [place] and ?has been authorized for detection and/or d

## 2021-08-22 NOTE — TOC Initial Note (Signed)
Transition of Care (TOC) - Initial/Assessment Note  ? ? ?Patient Details  ?Name: [name] ?MRN: [id number] ?Date of Birth: Jul 11, 1950 ? ?Transition of Care [facility]) CM/SW Contact:    ?Dessa Phi, RN ?Phone Number: ?08/22/2021, 10:18 AM ? ?Clinical Narrative: Monitor on 02.                ? ? ?Expected Discharge Plan: Home/Self Care ?Barriers to Discharge: Continued Medical Work up ? ? ?Patient Goals and CMS Choice ?Patient states their goals for this hospitalization and ongoing recovery are:: Home ?CMS Medicare.gov Compare Post Acute Care list provided to:: Patient ?Choice offered to / list presented to : Patient ? ?Expected Discharge Plan and Services ?Expected Discharge Plan: Home/Self Care ?  ?Discharge Planning Services: CM Consult ?  ?Living arrangements for the past 2 months: [place] ?                ?  ?  ?  ?  ?  ?  ?  ?  ?  ?  ? ?Prior Living Arrangements/Services ?Living arrangements for the past 2 months: [place] ?Lives with:: Spouse ?Patient language and need for interpreter reviewed:: Yes ?Do you feel safe going back to the place where you live?: Yes      ?Need for Family Participation in Patient Care: Yes (Comment) ?  ?  ?Criminal Activity/Legal Involvement Pertinent to Current Situation/Hospitalization: No - Comment as needed ? ?Activities of Daily Living ?Home Assistive Devices/Equipment: None ?ADL Screening (condition at time of admission) ?Patient's cognitive ability adequate to safely complete daily activities?: Yes ?Is the patient deaf or have difficulty hearing?: No ?Does the patient have difficulty seeing, even when wearing glasses/contacts?: Yes ?Does the patient have difficulty concentrating, remembering, or making decisions?: Yes ?Patient able to express need for assistance with ADLs?: Yes ?Does the patient have difficulty dressing or bathing?: No ?Independently performs ADLs?: Yes (appropriate for developmental age) ?Does the patient have difficulty walking or  climbing stairs?: No ?Weakness of Legs: None ?Weakness of Arms/Hands: None ? ?Permission Sought/Granted ?Permission sought to share information with : Case Manager ?Permission granted to share information with : Yes, Verbal Permission Granted ? Share Information with NAME: Case Manager ?   ?   ?   ? ?Emotional Assessment ?Appearance:: Appears stated age ?Attitude/Demeanor/Rapport: Gracious ?Affect (typically observed): Accepting ?Orientation: : Oriented to Self, Oriented to Place, Oriented to  Time, Oriented to Situation ?Alcohol / Substance Use: Not Applicable ?Psych Involvement: No (comment) ? ?Admission diagnosis:  COPD with acute exacerbation ([place]) [J44.1] ?Patient Active Problem List  ? Diagnosis Date Noted  ? COPD with acute exacerbation ([place]) 08/20/2021  ? Mild cognitive impairment 08/14/2021  ? Insomnia 08/01/2021  ? Localized edema 08/01/2021  ? Major depression, single episode 08/01/2021  ? Menopausal symptom 08/01/2021  ? Polyneuropathy due to type 2 diabetes mellitus ([place]) 08/01/2021  ? Vitamin D deficiency 08/01/2021  ? Obesity (BMI 30-39.9) 06/22/2021  ? Leukocytosis 06/22/2021  ? Hyponatremia 06/22/2021  ? Anemia of chronic disease 06/22/2021  ? Anxiety 06/19/2021  ? Biphasic stridor   ? Respiratory distress   ? Hypomagnesemia 05/13/2021  ? Dental caries 04/20/2021  ? Abnormal swallowing 03/29/2021  ? Acute non-ST segment elevation myocardial infarction [facility]) 03/29/2021  ? Chronic diarrhea 03/29/2021  ? Daytime somnolence 03/29/2021  ? 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  ? Abnormal mammogram of left breast 12/13/2020  ? Memory loss 03/20/2019  ? Severe persistent asthma without complication 97/06/6376  ? DM2 (  diabetes mellitus, type 2) ([place]) 03/15/2018  ? Chronic kidney disease, stage 3a ([place]) 03/15/2018  ? Dyspnea 08/12/2017  ? Macrocytic anemia 08/12/2017  ? Therapeutic drug monitoring 05/17/2017  ? Lung nodule 08/01/2016  ? Chronic diastolic CHF (congestive heart failure)  ([place]) 07/17/2016  ? Acute renal failure superimposed on stage 3b chronic kidney disease ([place]) 07/17/2016  ? Right leg swelling 02/27/2016  ? Uncontrolled type 2 diabetes mellitus with complication   ? Seizures ([place]) 01/27/2016  ? Numbness 01/27/2016  ? CAD in native artery 01/20/2016  ? Hypertensive heart disease 01/20/2016  ? NSTEMI (non-ST elevated myocardial infarction) ([place]) 01/18/2016  ? Dyspnea on exertion   ? Asthma exacerbation 07/21/2015  ? Upper airway cough syndrome with VCD 08/28/2014  ? Diabetes mellitus type 2, controlled ([place]) 08/06/2014  ? Hyperlipidemia 08/06/2014  ? COPD exacerbation ([place]) 03/09/2012  ? Allergic rhinitis 07/22/2008  ? Vocal cord dysfunction 07/22/2008  ? DYSPNEA 07/22/2008  ? Type 2 diabetes mellitus with hyperglycemia ([place]) 07/21/2008  ? DIABETIC PERIPHERAL NEUROPATHY 07/21/2008  ? Depression with anxiety 07/21/2008  ? Essential hypertension 07/21/2008  ? Mild persistent chronic asthma without complication 94/70/9628  ? GERD (gastroesophageal reflux disease) 07/21/2008  ? PULMONARY EMBOLISM, HX OF 07/21/2008  ? ?PCP:  [name], MD ?Pharmacy:   ?[facility] ([place]), [place] - [address] ?[address] ?[place] [address] ?Phone: [phone] Fax: [phone] ? ?[facility] ([facility] ) - [place], [place] ?[address] ?[place] [address] ?Phone: [phone] Fax: [phone] ? ? ? ? ?Social Determinants of Health (SDOH) Interventions ?  ? ?Readmission Risk Interventions ?   ? View : No data to display.  ?  ?  ?  ? ? ? ?

## 2021-08-22 NOTE — TOC Transition Note (Signed)
Transition of Care (TOC) - CM/SW Discharge Note ? ? ?Patient Details  ?Name: [name] ?MRN: [id number] ?Date of Birth: 1951/02/04 ? ?Transition of Care [facility]) CM/SW Contact:  ?Dessa Phi, RN ?Phone Number: ?08/22/2021, 1:09 PM ? ? ?Clinical Narrative:  otpt PT referral placed-patient in agreement. No further CM needs.  ? ? ? ?Final next level of care: OP Rehab ?Barriers to Discharge: No Barriers Identified ? ? ?Patient Goals and CMS Choice ?Patient states their goals for this hospitalization and ongoing recovery are:: Home ?CMS Medicare.gov Compare Post Acute Care list provided to:: Patient ?Choice offered to / list presented to : Patient ? ?Discharge Placement ?  ?           ?  ?  ?  ?  ? ?Discharge Plan and Services ?  ?Discharge Planning Services: CM Consult ?           ?  ?  ?  ?  ?  ?  ?  ?  ?  ?  ? ?Social Determinants of Health (SDOH) Interventions ?  ? ? ?Readmission Risk Interventions ? ?  08/22/2021  ? 10:19 AM  ?Readmission Risk Prevention Plan  ?Transportation Screening Complete  ?Medication Review Press photographer) Complete  ?PCP or Specialist appointment within 3-5 days of discharge Complete  ?[place] or Home Care Consult Complete  ?SW Recovery Care/Counseling Consult Complete  ?Palliative Care Screening Not Applicable  ?[place] Not Applicable  ? ? ? ? ? ?

## 2021-08-22 NOTE — Consult Note (Signed)
? ?NAME:  [name], MRN:  [id number], DOB:  1950/12/24, LOS: 1 ?ADMISSION DATE:  08/20/2021, CONSULTATION DATE:  08/22/21 ?REFERRING MD:  Dr. Niel Hummer, MD CHIEF COMPLAINT:  Asthma/COPD Exacerbation  ? ?History of Present Illness:  ?[name] is a 71 year old woman with history of anxiety/depression, dementia, HFpEF, coronary artery disease, DM II, GERD, CKD IIIb, asthma-COPD and vocal cord dysfunction who was admitted 4/22 for COPD exacerbation.  ? ?She reports having issues with seasonal allergies as she has increasing sinus pressure, rhinitis, wheezing, cough and dyspnea. She has been using her performist and budesonide nebs along with montelukast for her asthma. She has been using as needed albuterol. She has been taking pantoprazole for GERD.  ? ?She was recently admitted 4/16 to 4/17 for asthma exacerbation. She had to hospital admission in February for similar issues. She was seen by ENT on 06/23/21 in hospital where flexible laryngoscopy showed normal vocal cord mobility but severe arytenoid and post-cricoid mucosal edema concerning for chronic reflux.  ? ?She reports on going issues with GERD. She is taking pantoprazole daily and famotidine at bedtime. She has been snacking up until she goes to bed. She does not have GI doctor currently. ? ?She also reports increased stress due to her husbands recent cancer diagnosis and does have issues with anxiety.  ? ?Pertinent  Medical History  ? ? ?Significant Hospital Events: ?Including procedures, antibiotic start and stop dates in addition to other pertinent events   ?4/22 Admitted to hospital for COPD exacerbation ? ?Interim History / Subjective:  ? ? ?Objective   ?Blood pressure (!) 162/80, pulse 78, temperature 97.7 ?F (36.5 ?C), temperature source Oral, resp. rate (!) 24, height '5\' 2"'$  (1.575 m), weight 81.4 kg, SpO2 94 %. ?   ?   ? ?Intake/Output Summary (Last 24 hours) at 08/22/2021 0835 ?Last data filed at 08/21/2021 1723 ?Gross per 24 hour  ?Intake  360 ml  ?Output 1500 ml  ?Net -1140 ml  ? ?Filed Weights  ? 08/20/21 0114 08/20/21 1456 08/22/21 0503  ?Weight: 77.1 kg 78.9 kg 81.4 kg  ? ? ?Examination: ?General: elderly woman, no acute distress, nasal canula in place ?HENT: [place]/AT, moist mucous membranes, sclera anicteric ?Lungs: clear to auscultation. Upper airway wheeze due to sttrain, resolved when instructed to relax her breathing. ?Cardiovascular: rrr, no murmurs ?Abdomen: soft, non-tender, non-distended, BS+ ?Extremities: warm, no edema ?Neuro: A&O x 3, moving all extremities ?GU: no foley ? ?Chest Imaging ?CXR 08/20/21 ?The heart size and mediastinal contours are within normal limits. ?Both lungs are clear. The visualized skeletal structures are ?unremarkable. ? ?Resolved Hospital Problem list   ? ? ?Assessment & Plan:  ?Asthma Exacerbation due to GERD ?Severe GERD  ?Sinus Congestion ? ?Discussion: ?She reports ongoing issues with GERD which is likely leading to her recurrent hospitalizations along with anxiety/panic attacks that lead to dyspnea with strain of her upper airway that sounds like wheezing or stridor. She had flexible laryngoscopy with normal appearing and functioning vocal cords in February. She has not been adhering to eating at least 2 hours before bedtime as she was snacking until going to bed. She does not complain of seasonal allergies adding to her current issues. ? ?Plan: ?- Stop IV solumedrol. Prednisone taper ordered starting tomorrow.  ?- She is to continue ICS/LABA nebulizer treatments ?- She is to continue montelukast daily ?- She is to continue pantoprazole daily and famotidine at bedtime for GERD. ?- Recommend referral to [facility] for further outpatient  evaluation of her GERD. I would recommend she have pH probe evaluation done in order to consider nissen fundoplication by thoracic surgery in the future if GERD is indeed leading to a majority of her recurrent symptoms.  ?- She is to eat no later than 2 hours before bedtime and  this includes snacking. Only water prior to bed time if needed. ?- Wean off supplemental O2 and obtain ambulatory O2 sats ? ?PCCM will sign off. She can follow up with Dr. Melvyn Novas or APP in clinic.  ? ?Best Practice (right click and "Reselect all SmartList Selections" daily)  ? ?Per Primary Team ? ?Labs   ?CBC: ?Recent Labs  ?Lab 08/20/21 ?0149  ?WBC 12.1*  ?NEUTROABS 9.9*  ?HGB 12.5  ?HCT 38.2  ?MCV 97.0  ?PLT 295  ? ? ?Basic Metabolic Panel: ?Recent Labs  ?Lab 08/20/21 ?0149 08/21/21 ?2979  ?NA 137 136  ?K 5.3* 3.9  ?CL 105 104  ?CO2 22 25  ?GLUCOSE 250* 227*  ?BUN 31* 27*  ?CREATININE 1.56* 1.18*  ?CALCIUM 9.5 9.1  ? ?GFR: ?Estimated Creatinine Clearance: 42.6 mL/min (A) (by C-G formula based on SCr of 1.18 mg/dL (H)). ?Recent Labs  ?Lab 08/20/21 ?0149  ?WBC 12.1*  ? ? ?Liver Function Tests: ?Recent Labs  ?Lab 08/20/21 ?0149  ?AST 29  ?ALT 14  ?ALKPHOS 53  ?BILITOT 0.9  ?PROT 7.5  ?ALBUMIN 4.0  ? ?No results for input(s): LIPASE, AMYLASE in the last 168 hours. ?No results for input(s): AMMONIA in the last 168 hours. ? ?ABG ?   ?Component Value Date/Time  ? PHART 7.382 08/12/2017 0550  ? PCO2ART 45.0 08/12/2017 0550  ? PO2ART 163.0 (H) 08/12/2017 0550  ? HCO3 25.4 08/20/2021 0149  ? TCO2 30 03/15/2018 1100  ? ACIDBASEDEF 7.5 (H) 06/05/2021 2351  ? O2SAT 99.5 08/20/2021 0149  ?  ? ?Coagulation Profile: ?No results for input(s): INR, PROTIME in the last 168 hours. ? ?Cardiac Enzymes: ?No results for input(s): CKTOTAL, CKMB, CKMBINDEX, TROPONINI in the last 168 hours. ? ?HbA1C: ?Hgb A1c MFr Bld  ?Date/Time Value Ref Range Status  ?06/05/2021 06:06 PM 8.2 (H) 4.8 - 5.6 % Final  ?  Comment:  ?  (NOTE) ?Pre diabetes:          5.7%-6.4% ? ?Diabetes:              >6.4% ? ?Glycemic control for   <7.0% ?adults with diabetes ?  ?04/05/2021 12:22 AM 6.8 (H) 4.8 - 5.6 % Final  ?  Comment:  ?  (NOTE) ?Pre diabetes:          5.7%-6.4% ? ?Diabetes:              >6.4% ? ?Glycemic control for   <7.0% ?adults with diabetes ?   ? ? ?CBG: ?Recent Labs  ?Lab 08/20/21 ?1840 08/21/21 ?1111 08/21/21 ?1654 08/21/21 ?2020 08/22/21 ?0746  ?[place]  ? ? ?Review of Systems:   ?Review of Systems  ?Constitutional:  Negative for chills, fever, malaise/fatigue and weight loss.  ?HENT:  Positive for congestion. Negative for sinus pain and sore throat.   ?Eyes: Negative.   ?Respiratory:  Positive for cough, shortness of breath and wheezing. Negative for hemoptysis and sputum production.   ?Cardiovascular:  Negative for chest pain, palpitations, orthopnea, claudication and leg swelling.  ?Gastrointestinal:  Positive for heartburn. Negative for abdominal pain, nausea and vomiting.  ?Genitourinary: Negative.   ?Musculoskeletal:  Negative for joint pain and myalgias.  ?Skin:  Negative for rash.  ?Neurological:  Negative for weakness.  ?Endo/Heme/Allergies: Negative.   ?Psychiatric/Behavioral: Negative.    ? ?Past Medical History:  ?She,  has a past medical history of Anxiety, Arthritis, Asthma, Chronic diastolic CHF (congestive heart failure) ([place]) (07/17/2016), Coronary artery disease, Dementia ([place]), Depression, Diabetes mellitus, Dyslipidemia, GERD (gastroesophageal reflux disease), History of seizure, Hypertension, Hypertensive heart disease, NSTEMI (non-ST elevated myocardial infarction) ([place]) (12/2015), and Stage 3 chronic kidney disease ([place]).  ? ?Surgical History:  ? ?Past Surgical History:  ?Procedure Laterality Date  ? ABDOMINAL HYSTERECTOMY    ? CARDIAC CATHETERIZATION  1994  ? minimal LAD dz, no other dz, EF normal  ? CARDIAC CATHETERIZATION N/A 01/18/2016  ? Procedure: Left Heart Cath and Coronary Angiography;  Surgeon: Burnell Blanks, MD;  Location: [place] [facility];  Service: Cardiovascular;  Laterality: N/A;  ? CARDIAC CATHETERIZATION N/A 01/18/2016  ? Procedure: Coronary Stent Intervention;  Surgeon: Burnell Blanks, MD;  Location: [place];  Service: Cardiovascular;  Laterality: N/A;  ?  COLONOSCOPY WITH PROPOFOL N/A 04/09/2014  ? Procedure: COLONOSCOPY WITH PROPOFOL;  Surgeon: Cleotis Nipper, MD;  Location: [facility];  Service: Endoscopy;  Laterality: N/A;  ? CORONARY STENT PLACEMENT  09/20/2

## 2021-08-22 NOTE — Telephone Encounter (Signed)
Start spironolactone 25 mg once a day.  Repeat basic metabolic profile in 1 week.  Have her follow-up with the hypertension clinic in 2 to 4 weeks.  ?Candee Furbish, MD  ? ? ?

## 2021-08-23 DIAGNOSIS — J441 Chronic obstructive pulmonary disease with (acute) exacerbation: Secondary | ICD-10-CM | POA: Diagnosis not present

## 2021-08-23 LAB — RESPIRATORY PANEL BY PCR

## 2021-08-23 LAB — GLUCOSE, CAPILLARY
Glucose-Capillary: 210 mg/dL — ABNORMAL HIGH (ref 70–99)
Glucose-Capillary: 222 mg/dL — ABNORMAL HIGH (ref 70–99)
Glucose-Capillary: 239 mg/dL — ABNORMAL HIGH (ref 70–99)
Glucose-Capillary: 333 mg/dL — ABNORMAL HIGH (ref 70–99)
Glucose-Capillary: 335 mg/dL — ABNORMAL HIGH (ref 70–99)
Glucose-Capillary: 419 mg/dL — ABNORMAL HIGH (ref 70–99)

## 2021-08-23 MED ORDER — INSULIN ASPART 100 UNIT/ML IJ SOLN
15.0000 [IU] | Freq: Once | INTRAMUSCULAR | Status: AC
  Administered 2021-08-24: 15 [IU] via SUBCUTANEOUS

## 2021-08-23 NOTE — Discharge Instructions (Signed)
Local Endocrinologists ?[facility] [phone]) ?Dr. Philemon Kingdom  ****** ?Dr. Elayne Snare ?[place]  ***** ?[facility] [phone]) ?Dr. Delrae Rend ?[facility] [phone]) ?Dr. Jacelyn Pi  **** ?Dr. Anda Kraft ?[place] [phone]) ?Dr. Reynold Bowen ?[facility] [phone]) [[place] office]  [phone]) [[place] office] ?Dr. Lavone Orn ?Dr. Mee Hives ?[facility]) [phone]) ?Autumn Hudnall Ronnald Ramp), PA ?Dr. Amalia Greenhouse ?Dr. Marsh Dolly. ?[facility] [phone]) ?Dr. Glade Lloyd ?Dr. Carolynn Serve. [place] [facility] [place] [phone]) ?

## 2021-08-23 NOTE — Plan of Care (Signed)
?  Problem: Activity: ?Goal: Ability to tolerate increased activity will improve ?Outcome: Progressing ?  ?Problem: Respiratory: ?Goal: Ability to maintain a clear airway will improve ?Outcome: Progressing ?Goal: Ability to maintain adequate ventilation will improve ?Outcome: Progressing ?  ?Problem: Education: ?Goal: Knowledge of General Education information will improve ?Description: Including pain rating scale, medication(s)/side effects and non-pharmacologic comfort measures ?Outcome: Progressing ?  ?Problem: Activity: ?Goal: Risk for activity intolerance will decrease ?Outcome: Progressing ?  ?Problem: Coping: ?Goal: Level of anxiety will decrease ?Outcome: Progressing ?  ?Problem: Elimination: ?Goal: Will not experience complications related to bowel motility ?Outcome: Progressing ?Goal: Will not experience complications related to urinary retention ?Outcome: Progressing ?  ?Problem: Pain Managment: ?Goal: General experience of comfort will improve ?Outcome: Progressing ?  ?Problem: Safety: ?Goal: Ability to remain free from injury will improve ?Outcome: Progressing ?  ?Problem: Skin Integrity: ?Goal: Risk for impaired skin integrity will decrease ?Outcome: Progressing ?  ?

## 2021-08-23 NOTE — Telephone Encounter (Signed)
Dr Marlou Porch' comments/orders sent to pt via [place] ?

## 2021-08-23 NOTE — Progress Notes (Signed)
Notified on call provider about patient's 2200 CBG, which was 452. On call provider gave new orders, and for patient's CBG to be checked at midnight and 0300. On call provider just wanted to monitor patient's CBG. Patient's CBG around midnight was 333, and patient's CBG for 0300 (checked at 0412) was 239. Continued to let on call provider know about these CBGs. ?

## 2021-08-23 NOTE — Progress Notes (Addendum)
Inpatient Diabetes Program Recommendations ? ?AACE/ADA: New Consensus Statement on Inpatient Glycemic Control (2015) ? ?Target Ranges:  Prepandial:   less than 140 mg/dL ?     Peak postprandial:   less than 180 mg/dL (1-2 hours) ?     Critically ill patients:  140 - 180 mg/dL  ? ?Lab Results  ?Component Value Date  ? GLUCAP 210 (H) 08/23/2021  ? HGBA1C 8.2 (H) 06/05/2021  ? ? ?Review of Glycemic Control ? Latest Reference Range & Units 08/22/21 21:17 08/23/21 00:37 08/23/21 04:12 08/23/21 07:16 08/23/21 11:35  ?Glucose-Capillary 70 - 99 mg/dL 452 (H) 333 (H) 239 (H) 222 (H) 210 (H)  ?(H): Data is abnormally high ?Diabetes history: Type 2 Dm ?Outpatient Diabetes medications: Metformin 1000 mg BID, Glipizide 5 mg QD ?Current orders for Inpatient glycemic control: Novolog 0-20 units TID, glipizide 5 mg QD, Semglee 15 units QD ?Prednisone 40 mg BID w/ taper plan ?  ?Inpatient Diabetes Program Recommendations:   ?  ?Consider increasing Semglee 20 units QD and adding Novolog 0-5 units QHS. ? ?Addendum: spoke with patient regarding outpatient diabetes management. Insulin needs are increased and patient is concerned about long term plan with insulin.  ?Reviewed patient's current A1c of 8.2% which is up from 6.8% in 12/22. Explained what a A1c is and what it measures. Also reviewed goal A1c with patient, importance of good glucose control @ home, and blood sugar goals. Reviewed patho of DM, role of pancreas, need for insulin, impact of steroids, role of oral antidiabetic agents, vascular changes and commorbidities.  ?Patient encouraged to be mindful of intake of CHO and reviewed plate method. Patient is followed by internal medicine for DM, however, has not been recently due to following up with specialists in cardiology and pulmonology. Endocrinology list attached to DC summary.  ?Educated patient on insulin pen use at home. Reviewed contents of insulin flexpen starter kit. Reviewed all steps if insulin pen including  attachment of needle, 2-unit air shot, dialing up dose, giving injection, removing needle, disposal of sharps, storage of unused insulin, disposal of insulin etc. Patient able to provide successful return demonstration. Also reviewed troubleshooting with insulin pen. MD to give patient Rxs for insulin pens and insulin pen needles. ?Reviewed at length when to check blood sugars and when to call PCP. Patient verbalized understanding and has no further questions.  ? ? ?Thanks, ?Bronson Curb, MSN, RNC-OB ?Diabetes Coordinator ?[phone] ([phone]) ? ? ? ? ?

## 2021-08-23 NOTE — Telephone Encounter (Signed)
Since she is in the hospital, her medications may be adjusted by the hospitalist team.  Follow their direction for now.  What ever they prescribed continue.  They are watching her blood pressure closely.  ?Candee Furbish, MD   ? ?

## 2021-08-23 NOTE — Progress Notes (Signed)
Physical Therapy Treatment ?Patient Details ?Name: [name] ?MRN: [id number] ?DOB: 28-Oct-1950 ?Today's Date: 08/23/2021 ? ? ?History of Present Illness Pt. is a 71 y.o. F presenting to [facility] on 4/22 complaining of worsening SOB, nonproductive cough, rhinorrhea, and sinus pressure. PMH significant for anxiety, depression, mild dementia, mild cognitive impairment, chronic diastolic heart failure, CAD, diabetes type 2, GERD, history of seizure, hypertension, history of non-STEMI, CKD stage IIIa, class I obesity with current BMI 31. Recently admitted 4/16-17 with respiratory distress. ? ?  ?PT Comments  ? ? Patient remains motivated to mobilize and reports she has been up to bathroom independently. Patient was able to ambulate ~ 180' with supervision and no device; gait is slow but overall steady and Spo2 91% or greater on room air. Pt completed stair training today with no cues required and min guard for safety. Pt steady with use of single rail to complete 4 steps. Pt is mobilizing well and has no further acute PT needs and is safe to mobilize daily with RN/NT staff to maintain functional independence and activity tolerance. Please re-consult if there is a decline in functional mobility status. ? ?  ?Recommendations for follow up therapy are one component of a multi-disciplinary discharge planning process, led by the attending physician.  Recommendations may be updated based on patient status, additional functional criteria and insurance authorization. ? ?Follow Up Recommendations ? Outpatient PT ?  ?  ?Assistance Recommended at Discharge PRN  ?Patient can return home with the following A little help with walking and/or transfers;A little help with bathing/dressing/bathroom;Assistance with cooking/housework ?  ?Equipment Recommendations ? None recommended by PT  ?  ?Recommendations for Other Services   ? ? ?  ?Precautions / Restrictions Precautions ?Precautions: Fall ?Precaution Comments: Watch O2  saturation ?Restrictions ?Weight Bearing Restrictions: No  ?  ? ?Mobility ? Bed Mobility ?Overal bed mobility: Modified Independent ?  ?  ?  ?  ?  ?  ?General bed mobility comments: Able to sit EOB without difficulty ?  ? ?Transfers ?Overall transfer level: Needs assistance ?Equipment used: None ?Transfers: Sit to/from Stand ?Sit to Stand: Modified independent (Device/Increase time) ?  ?  ?  ?  ?  ?General transfer comment: mod I to rise from EOB and sit in recliner ?  ? ?Ambulation/Gait ?Ambulation/Gait assistance: Supervision ?Gait Distance (Feet): 180 Feet ?Assistive device: None ?Gait Pattern/deviations: Decreased stride length, Drifts right/left ?Gait velocity: decreased ?  ?  ?General Gait Details: pt steady with slow cautious cadence. occasionally reaching for external support, cues to deter this throughout. pt able to increase velocity with cuing, SpO2 91% or better throughout. ? ? ?Stairs ?Stairs: Yes ?Stairs assistance: Min guard, Supervision ?Stair Management: One rail Left, Alternating pattern, Forwards ?Number of Stairs: 4 ?General stair comments: no cues needed, pt steady with single rail, able to turn around on 4th step to descend steps. no LOB noted. guarding/supervision for safety. ? ? ?Wheelchair Mobility ?  ? ?Modified Rankin (Stroke Patients Only) ?  ? ? ?  ?Balance Overall balance assessment: Mild deficits observed, not formally tested ?Sitting-balance support: No upper extremity supported ?Sitting balance-Leahy Scale: Good ?  ?  ?  ?Standing balance-Leahy Scale: Good ?  ?  ?  ?  ?  ?  ?  ?  ?  ?  ?  ?  ?  ? ?  ?Cognition Arousal/Alertness: Awake/alert ?Behavior During Therapy: [facility] for tasks assessed/performed ?Overall Cognitive Status: Within Functional Limits for tasks assessed ?  ?  ?  ?  ?  ?  ?  ?  ?  ?  ?  ?  ?  ?  ?  ?  ?  ?  ?  ? ?  ?  Exercises   ? ?  ?General Comments   ?  ?  ? ?Pertinent Vitals/Pain Pain Assessment ?Pain Assessment: No/denies pain  ? ? ?Home Living   ?  ?  ?  ?  ?  ?  ?   ?  ?  ?   ?  ?Prior Function    ?  ?  ?   ? ?PT Goals (current goals can now be found in the care plan section) Acute Rehab PT Goals ?Patient Stated Goal: Improve endurance and return home. ?PT Goal Formulation: With patient ?Time For Goal Achievement: 09/05/21 ?Potential to Achieve Goals: Good ?Progress towards PT goals: Progressing toward goals;Goals met/education completed, patient discharged from PT (some goals met, pt at safe level to discharge, shoudl mobilize with RN staff) ? ?  ?Frequency ? ? ? Min 3X/week ? ? ? ?  ?PT Plan Current plan remains appropriate  ? ? ?Co-evaluation   ?  ?  ?  ?  ? ?  ?AM-PAC PT "6 Clicks" Mobility   ?Outcome Measure ? Help needed turning from your back to your side while in a flat bed without using bedrails?: None ?Help needed moving from lying on your back to sitting on the side of a flat bed without using bedrails?: None ?Help needed moving to and from a bed to a chair (including a wheelchair)?: None ?Help needed standing up from a chair using your arms (e.g., wheelchair or bedside chair)?: None ?Help needed to walk in hospital room?: A Little ?Help needed climbing 3-5 steps with a railing? : A Little ?6 Click Score: 22 ? ?  ?End of Session Equipment Utilized During Treatment: Gait belt ?Activity Tolerance: Patient limited by fatigue ?Patient left: in chair;with call bell/phone within reach ?Nurse Communication: Mobility status ?PT Visit Diagnosis: Unsteadiness on feet (R26.81);Muscle weakness (generalized) (M62.81);Other abnormalities of gait and mobility (R26.89) ?  ? ? ?Time: 3267-1245 ?PT Time Calculation (min) (ACUTE ONLY): 16 min ? ?Charges:  $Gait Training: 8-22 mins          ?          ? ?Gwynneth Albright PT, DPT ?Acute Rehabilitation Services ?Office [phone] ?Pager [phone]  ? ? ?Jacques Navy ?08/23/2021, 12:05 PM ? ?

## 2021-08-23 NOTE — Progress Notes (Signed)
?PROGRESS NOTE ? ? ? ?[name]  TSV:[id number] DOB: 28-Oct-1950 DOA: 08/20/2021 ?PCP: [name], MD  ? ?Brief Narrative: ?71 year old with past medical history significant for anxiety, depression, mild dementia, mild cognitive impairment, chronic diastolic heart failure, CAD, diabetes type 2, GERD, history of seizure, hypertension, history of non-STEMI, CKD stage IIIa, class I obesity with current BMI 31 who was recently admitted and discharged due to COPD exacerbation presents back to the ED complaining of worsening shortness of breath, nonproductive cough, rhinorrhea and sinus pressure.  She has been having a lot of issues with allergies.  She has been compliant with her medication.  She completed azithromycin and prednisone course.  She complained of chest pain. ? ?Evaluation in the ED chest x-ray showed normal heart size with clear lungs. ?BNP 24, normal D-dimer, troponin x2 negative. ? ?Patient was admitted with asthma/COPD exacerbation. Plan to observed overnight on prednisone.  ? ?Assessment & Plan: ?  ?Principal Problem: ?  COPD with acute exacerbation ([place]) ?Active Problems: ?  Type 2 diabetes mellitus with hyperglycemia ([place]) ?  Depression with anxiety ?  Essential hypertension ?  Vocal cord dysfunction ?  GERD (gastroesophageal reflux disease) ?  Hyperlipidemia ?  Asthma exacerbation ?  CAD in native artery ?  Chronic diastolic CHF (congestive heart failure) ([place]) ?  Chronic kidney disease, stage 3a ([place]) ?  Polyneuropathy due to type 2 diabetes mellitus ([place]) ? ?1-Asthma/COPD with acute exacerbation: ?Recurrent admission. Probably related to Reflux.  ?Received IV steroids, Transition to prednisone by pulmonologist 4/24. But received IV steroids 4/24. ?Continue with brovana, continue with Pulmicort, duoneb, montelukast.  ?PPI.  ?Continue with  Guaifenesin and Flutter valve.  ?D dimer 0.4. ?Pulmonologist consulted. Pulmonologist think asthma exacerbation might be related to uncontrolled reflux. She  will need referral to [facility].  ? ?2-Diabetes type 2 with hyperglycemia: ?Continue with SSI Insulin ?Hold metformin ?Continue with glipizide ?CBG elevated in setting steroids. Started [place]lee. Now on oral prednisone. Expect CBG will decreased.  ? ?Depression with anxiety: ?Continue with Depakote and Xanax ? ?Hypertension: ?Continue with Cardizem, oral hydralazine. ?PRN IV hydralazine ? ?Vocal cord dysfunction: ?Speech consultation for follow-up ? ?GERD: ?Continue with PPI twice daily ?Started on Carafate.  ?Precaution discussed with patient.  ? ?CAD: ?Continue with bystolic, cardizem.  ? ?Chronic diastolic heart failure: ?Compensated.  ?Not on diuretics.  ? ?Hyperkalemia;  ?Awaiting Bmet. Might need lokelma.  ? ?CKD stage III a monitor.  ?Prior Cr 1.3--1.5 ? 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 ? ? ?Estimated body mass index is 32.81 kg/m? as calculated from the following: ?  Height as of this encounter: '5\' 2"'$  (1.575 m). ?  Weight as of this encounter: 81.4 kg. ? ? ?DVT prophylaxis: Lovenox ?Code Status: Full code ?Family Communication: Care discussed with patient ?Disposition Plan:  ?Status is: Observation ?The patient remains OBS appropriate and will d/c before 2 midnights. ? ? ? ?Consultants:  ?None ? ?Procedures:  ?None ? ?Antimicrobials:  ? ? ?Subjective: ?She report severe reflux symptoms. She is breathing better.  ? ?Objective: ?Vitals:  ? 08/22/21 1334 08/22/21 2114 08/23/21 0417 08/23/21 0848  ?BP: (!) 169/52 (!) 177/81 (!) 149/73   ?Pulse: 71 84 86   ?Resp: '15 20 20   '$ ?Temp:  98.1 ?F (36.7 ?C) 98 ?F (36.7 ?C)   ?TempSrc: Oral Oral Oral   ?SpO2: 99% 95% 94% 94%  ?Weight:      ?Height:      ? ? ?Intake/Output Summary (Last 24  hours) at 08/23/2021 1301 ?Last data filed at 08/23/2021 7062 ?Gross per 24 hour  ?Intake 480 ml  ?Output 1300 ml  ?Net -820 ml  ? ? ?Filed Weights  ? 08/20/21 0114 08/20/21 1456 08/22/21 0503  ?Weight: 77.1 kg 78.9 kg 81.4 kg  ? ? ?Examination: ? ?General exam: NAD ?Respiratory  system: BL air movement.  ?Cardiovascular system: S 1, S 2 RRR ?Gastrointestinal system: BS present, soft, nt ?Central nervous system:non focal. ?Extremities: Symmetric power ? ? ? ?Data Reviewed: I have personally reviewed following labs and imaging studies ? ?CBC: ?Recent Labs  ?Lab 08/20/21 ?0149  ?WBC 12.1*  ?NEUTROABS 9.9*  ?HGB 12.5  ?HCT 38.2  ?MCV 97.0  ?PLT 295  ? ? ?Basic Metabolic Panel: ?Recent Labs  ?Lab 08/20/21 ?0149 08/21/21 ?3762  ?NA 137 136  ?K 5.3* 3.9  ?CL 105 104  ?CO2 22 25  ?GLUCOSE 250* 227*  ?BUN 31* 27*  ?CREATININE 1.56* 1.18*  ?CALCIUM 9.5 9.1  ? ? ?GFR: ?Estimated Creatinine Clearance: 42.6 mL/min (A) (by C-G formula based on SCr of 1.18 mg/dL (H)). ?Liver Function Tests: ?Recent Labs  ?Lab 08/20/21 ?0149  ?AST 29  ?ALT 14  ?ALKPHOS 53  ?BILITOT 0.9  ?PROT 7.5  ?ALBUMIN 4.0  ? ? ?No results for input(s): LIPASE, AMYLASE in the last 168 hours. ?No results for input(s): AMMONIA in the last 168 hours. ?Coagulation Profile: ?No results for input(s): INR, PROTIME in the last 168 hours. ?Cardiac Enzymes: ?No results for input(s): CKTOTAL, CKMB, CKMBINDEX, TROPONINI in the last 168 hours. ?BNP (last 3 results) ?No results for input(s): PROBNP in the last 8760 hours. ?HbA1C: ?No results for input(s): HGBA1C in the last 72 hours. ?CBG: ?Recent Labs  ?Lab 08/22/21 ?2117 08/23/21 ?8315 08/23/21 ?1761 08/23/21 ?6073 08/23/21 ?1135  ?GLUCAP 452* 333* 239* 222* 210*  ? ? ?Lipid Profile: ?No results for input(s): CHOL, HDL, LDLCALC, TRIG, CHOLHDL, LDLDIRECT in the last 72 hours. ?Thyroid Function Tests: ?No results for input(s): TSH, T4TOTAL, FREET4, T3FREE, THYROIDAB in the last 72 hours. ?Anemia Panel: ?No results for input(s): VITAMINB12, FOLATE, FERRITIN, TIBC, IRON, RETICCTPCT in the last 72 hours. ?Sepsis Labs: ?No results for input(s): PROCALCITON, LATICACIDVEN in the last 168 hours. ? ?Recent Results (from the past 240 hour(s))  ?Resp Panel by RT-PCR (Flu A&B, Covid)     Status: None  ?  Collection Time: 08/14/21  2:38 PM  ?Result Value Ref Range Status  ? SARS Coronavirus 2 by RT PCR NEGATIVE NEGATIVE Final  ?  Comment: (NOTE) ?SARS-CoV-2 target nucleic acids are NOT DETECTED. ? ?The SARS-CoV-2 RNA is generally detectable in upper respiratory ?specimens during the acute phase of infection. The lowest ?concentration of SARS-CoV-2 viral copies this assay can detect is ?138 copies/mL. A negative result does not preclude SARS-Cov-2 ?infection and should not be used as the sole basis for treatment or ?other patient management decisions. A negative result may occur with  ?improper specimen collection/handling, submission of specimen other ?than nasopharyngeal swab, presence of viral mutation(s) within the ?areas targeted by this assay, and inadequate number of viral ?copies(<138 copies/mL). A negative result must be combined with ?clinical observations, patient history, and epidemiological ?information. The expected result is Negative. ? ?Fact Sheet for Patients:  ?EntrepreneurPulse.com.au ? ?Fact Sheet for Healthcare Providers:  ?IncredibleEmployment.be ? ?This test is no t yet approved or cleared by the [place] FDA and  ?has been authorized for detection and/or diagnosis of SARS-CoV-2 by ?FDA under an Emergency Use Authorization (EUA). This  EUA will remain  ?in effect (meaning this test can be used) for the duration of the ?COVID-19 declaration under Section 564(b)(1) of the Act, 21 ?U.S.C.section 360bbb-3(b)(1), unless the authorization is terminated  ?or revoked sooner.  ? ? ?  ? Influenza A by PCR NEGATIVE NEGATIVE Final  ? Influenza B by PCR NEGATIVE NEGATIVE Final  ?  Comment: (NOTE) ?The Xpert Xpress SARS-CoV-2/FLU/RSV plus assay is intended as an aid ?in the diagnosis of influenza from Nasopharyngeal swab specimens and ?should not be used as a sole basis for treatment. Nasal washings and ?aspirates are unacceptable for Xpert Xpress  SARS-CoV-2/FLU/RSV ?testing. ? ?Fact Sheet for Patients: ?EntrepreneurPulse.com.au ? ?Fact Sheet for Healthcare Providers: ?IncredibleEmployment.be ? ?This test is not yet approved or cleared

## 2021-08-24 DIAGNOSIS — N1831 Chronic kidney disease, stage 3a: Secondary | ICD-10-CM

## 2021-08-24 DIAGNOSIS — K219 Gastro-esophageal reflux disease without esophagitis: Secondary | ICD-10-CM

## 2021-08-24 DIAGNOSIS — F418 Other specified anxiety disorders: Secondary | ICD-10-CM

## 2021-08-24 DIAGNOSIS — I5032 Chronic diastolic (congestive) heart failure: Secondary | ICD-10-CM

## 2021-08-24 DIAGNOSIS — J383 Other diseases of vocal cords: Secondary | ICD-10-CM

## 2021-08-24 DIAGNOSIS — E1142 Type 2 diabetes mellitus with diabetic polyneuropathy: Secondary | ICD-10-CM

## 2021-08-24 DIAGNOSIS — E1165 Type 2 diabetes mellitus with hyperglycemia: Secondary | ICD-10-CM

## 2021-08-24 DIAGNOSIS — I10 Essential (primary) hypertension: Secondary | ICD-10-CM

## 2021-08-24 LAB — GLUCOSE, CAPILLARY
Glucose-Capillary: 100 mg/dL — ABNORMAL HIGH (ref 70–99)
Glucose-Capillary: 257 mg/dL — ABNORMAL HIGH (ref 70–99)
Glucose-Capillary: 356 mg/dL — ABNORMAL HIGH (ref 70–99)

## 2021-08-24 MED ORDER — GLIPIZIDE 5 MG PO TABS: ORAL_TABLET | ORAL | 0 refills | Status: DC

## 2021-08-24 MED ORDER — PREDNISONE 10 MG PO TABS: ORAL_TABLET | ORAL | 0 refills | Status: AC

## 2021-08-24 MED ORDER — GUAIFENESIN ER 600 MG PO TB12
1200.0000 mg | ORAL_TABLET | Freq: Two times a day (BID) | ORAL | 0 refills | Status: AC
Start: 2021-08-24 — End: 2021-08-29

## 2021-08-24 NOTE — Assessment & Plan Note (Signed)
Stable

## 2021-08-24 NOTE — Progress Notes (Signed)
AVS and discharge instructions reviewed w/ patient. Patient verbalized understanding and had no further questions. Transportation set up by patient. ?

## 2021-08-24 NOTE — Assessment & Plan Note (Signed)
Could be contributing to his symptoms.  She has upper airway sounds on exam ?-Manage GERD as above. ?

## 2021-08-24 NOTE — Discharge Summary (Signed)
? ?Physician Discharge Summary  ?[name] PJA:[id number] DOB: 13-Feb-1951 DOA: 08/20/2021 ? ?PCP: [name], MD ? ?Admit date: 08/20/2021 ?Discharge date: 08/24/2021 ?Admitted From: Home ?Disposition: Home ?Recommendations for Outpatient Follow-up:  ?Follow ups as below. ?Ambulatory referral to GI ordered. ?Please follow up on the following pending results: None ? ?Home Health: Outpatient referral to PT ordered ?Equipment/Devices: None ? ?Discharge Condition: Stable ?CODE STATUS: Full code ? Follow-up Information   ? ? [facility] Follow up.   ?Specialty: Rehabilitation ?Why: the office will call for appt. ?Contact information: ?[address] ?[phone] mc ?[place] ?[phone] ? ?  ?  ? ?  ?  ? ?  ? ? ?Hospital course ?71 year old F with PMH of vocal cord dysfunction, diastolic CHF, AST-4H, CAD, DM-2, GERD, anxiety, depression, obesity and seizure disorder presenting with shortness of breath and dry cough and admitted for "COPD exacerbation".  Multiple ED visits and hospitalizations for respiratory distress/COPD/asthma exacerbation.  Had PFT in 2019 reported as severe restriction with disproportionate reduction in ERV c/w body habitus.  She is followed by Dr. Leonides Schanz with [facility] pulmonology. ? ?Patient was started on systemic steroid and nebulizers.  Pulmonology consulted and felt her symptoms to be related to acid reflux.  Patient was continued on p.o. Protonix 40 mg twice daily.  Patient's symptoms improved.  She is discharged on prednisone taper and p.o. Protonix 40 mg twice daily.  Ambulatory referral to GI ordered.  ? ?Patient was ambulated on room air and maintained saturation above 91%.  ?  ? ?See individual problem list below for more on hospital course. ? ?Problems addressed during this hospitalization ?* COPD with acute exacerbation ([place]) ?Suspected to be due to GERD/reflux. ?-Discharged on prednisone taper per pulmonology  recommendations ?-Patient to continue home Pulmicort and Perforomist with as needed albuterol. ?-P.o. Protonix 40 mg twice daily ?-Ambulatory referral to GI ordered. ? ?Chronic kidney disease, stage 3a ([place]) ?Stable. ? ?Chronic diastolic CHF (congestive heart failure) ([place]) ?Appears euvolemic on exam.  Not on diuretics at home. ? ?GERD (gastroesophageal reflux disease) ?P.o. Protonix 40 mg twice daily ? ?Vocal cord dysfunction ?Could be contributing to his symptoms.  She has upper airway sounds on exam ?-Manage GERD as above. ? ?Essential hypertension ?Stable.  Continue home meds ? ?Depression with anxiety ?Stable.  Continue home meds. ? ?Type 2 diabetes mellitus with hyperglycemia ([place]) ?Uncontrolled.  A1c 8.2% about 3 months ago. ?-Patient to increase glipizide to 10 mg while on steroid, and resume 5 mg after that ?-Continue home metformin and statin. ? ? ? ? ?  ?  ?  ?  ? ?  ? ?Vital signs ?Vitals:  ? 08/23/21 1925 08/23/21 1929 08/23/21 2142 08/24/21 9622  ?BP:   (!) 157/72 (!) 166/73  ?Pulse:   86 71  ?Temp:   99.5 ?F (37.5 ?C) 98.6 ?F (37 ?C)  ?Resp:   16 16  ?Height:      ?Weight:      ?SpO2: 93% 96% 95% 100%  ?TempSrc:   Oral Oral  ?BMI (Calculated):      ?  ? ?Discharge exam ? ?GENERAL: No apparent distress.  Nontoxic. ?HEENT: MMM.  Vision and hearing grossly intact.  ?NECK: Supple.  No apparent JVD.  ?RESP:  No IWOB.  Upper airway sounds transmitted throughout. ?CVS:  RRR. Heart sounds normal.  ?ABD/GI/GU: BS+. Abd soft, NTND.  ?MSK/EXT:  Moves extremities. No apparent deformity. No edema.  ?SKIN: no apparent skin lesion or wound ?  NEURO: Awake and alert. Oriented appropriately.  No apparent focal neuro deficit. ?PSYCH: Calm. Normal affect.  ? ?Discharge Instructions ?Discharge Instructions   ? ? Ambulatory referral to Gastroenterology   Complete by: As directed ?  ? Call MD for:  difficulty breathing, headache or visual disturbances   Complete by: As directed ?  ? Call MD for:  extreme fatigue   Complete  by: As directed ?  ? Diet - low sodium heart healthy   Complete by: As directed ?  ? Diet Carb Modified   Complete by: As directed ?  ? Discharge instructions   Complete by: As directed ?  ? It has been a pleasure taking care of you! ? ?You were hospitalized due to asthma/COPD exacerbation for which you have been treated with a steroid and nebulizers.  Your symptoms improved to the point we think it is safe to let you go home and follow-up with your primary care doctor and lung doctor.  There is a concern that your acid reflux might be contributing to his COPD exacerbation.  We have sent a referral to gastroenterology for further evaluation.  Meanwhile, it is very important that you continue taking your Protonix (acid reflux medication) as prescribed. ? ?Please review your new medication list and the directions on your medications before you take them. ? ? ?Take care,  ? Increase activity slowly   Complete by: As directed ?  ? ?  ? ?Allergies as of 08/24/2021   ? ?   Reactions  ? Tramadol Other (See Comments)  ? Not to be taken because of existing seizure disorder/SEIZURES  ? Chlorthalidone Other (See Comments)  ? Was stopped due to kidney function  ? ?  ? ?  ?Medication List  ?  ? ?STOP taking these medications   ? ?azithromycin 250 MG tablet ?Commonly known as: ZITHROMAX ?  ?FAMOTIDINE PO ?  ?mometasone-formoterol 100-5 MCG/ACT Aero ?Commonly known as: DULERA ?  ? ?  ? ?TAKE these medications   ? ?acetaminophen-codeine 300-30 MG tablet ?Commonly known as: TYLENOL #3 ?Take 1 tablet by mouth every 6 (six) hours as needed for moderate pain or severe pain. ?  ?albuterol 108 (90 Base) MCG/ACT inhaler ?Commonly known as: ProAir HFA ?Inhale 2 puffs into the lungs every 4 (four) hours as needed for wheezing or shortness of breath. ?  ?albuterol (2.5 MG/3ML) 0.083% nebulizer solution ?Commonly known as: PROVENTIL ?Take 3 mLs (2.5 mg total) by nebulization every 4 (four) hours as needed for wheezing or shortness of breath. ?   ?ALPRAZolam 0.5 MG tablet ?Commonly known as: Duanne Moron ?Take 1 tablet (0.5 mg total) by mouth 2 (two) times daily as needed for anxiety. ?What changed:  ?when to take this ?additional instructions ?  ?atorvastatin 40 MG tablet ?Commonly known as: LIPITOR ?Take 40 mg by mouth at bedtime. ?  ?Biotin 5000 MCG Caps ?Take 5,000 mcg by mouth daily. ?  ?budesonide 0.5 MG/2ML nebulizer solution ?Commonly known as: PULMICORT ?USE 1 VIAL IN NEBULIZER IN THE MORNING AND AT BEDTIME ?What changed: See the new instructions. ?  ?diltiazem 240 MG 24 hr capsule ?Commonly known as: CARDIZEM CD ?TAKE 1 CAPSULE BY MOUTH DAILY ?  ?divalproex 500 MG 24 hr tablet ?Commonly known as: DEPAKOTE ER ?TAKE 1 TABLET BY MOUTH AT  BEDTIME ?  ?EQ Probiotic Caps ?Take 1 capsule by mouth every morning. ?  ?ferrous sulfate 325 (65 FE) MG tablet ?Take 325 mg by mouth daily with breakfast. ?  ?FIBER CHOICE  PO ?Take 1 capsule by mouth daily. ?  ?fluticasone 50 MCG/ACT nasal spray ?Commonly known as: FLONASE ?Place 2 sprays into both nostrils daily as needed for allergies. ?  ?formoterol 20 MCG/2ML nebulizer solution ?Commonly known as: Perforomist ?USE 1 VIAL  IN  NEBULIZER TWICE  DAILY - morning and evening ?What changed:  ?how much to take ?how to take this ?when to take this ?additional instructions ?  ?glipiZIDE 5 MG tablet ?Commonly known as: GLUCOTROL ?Take 2 tablets (10 mg total) by mouth daily before breakfast for 5 days, THEN 1 tablet (5 mg total) daily before breakfast. ?Start taking on: August 24, 2021 ?What changed: See the new instructions. ?  ?guaiFENesin 600 MG 12 hr tablet ?Commonly known as: [place] ?Take 2 tablets (1,200 mg total) by mouth 2 (two) times daily for 5 days. ?  ?hydrALAZINE 100 MG tablet ?Commonly known as: APRESOLINE ?TAKE 1 TABLET BY MOUTH 3 TIMES  DAILY ?  ?ketoconazole 2 % cream ?Commonly known as: NIZORAL ?Apply to both feet and between toes once daily for 6 weeks for Athlete's feet. ?What changed:  ?how much to take ?how  to take this ?when to take this ?  ?Klor-Con M20 20 MEQ tablet ?Generic drug: potassium chloride SA ?Take 20 mEq by mouth every other day. ?  ?latanoprost 0.005 % ophthalmic solution ?Commonly known as: Franklyn Lor

## 2021-08-24 NOTE — Assessment & Plan Note (Signed)
Stable.  Continue home meds. ?

## 2021-08-24 NOTE — Plan of Care (Signed)
?  Problem: Education: ?Goal: Knowledge of disease or condition will improve ?Outcome: Progressing ?  ?Problem: Respiratory: ?Goal: Ability to maintain a clear airway will improve ?Outcome: Progressing ?  ?Problem: Education: ?Goal: Knowledge of General Education information will improve ?Description: Including pain rating scale, medication(s)/side effects and non-pharmacologic comfort measures ?Outcome: Progressing ?  ?

## 2021-08-24 NOTE — Assessment & Plan Note (Signed)
Appears euvolemic on exam.  Not on diuretics at home. ?

## 2021-08-24 NOTE — Assessment & Plan Note (Signed)
Uncontrolled.  A1c 8.2% about 3 months ago. ?-Patient to increase glipizide to 10 mg while on steroid, and resume 5 mg after that ?-Continue home metformin and statin. ?

## 2021-08-24 NOTE — Assessment & Plan Note (Signed)
P.o. Protonix 40 mg twice daily ?

## 2021-08-24 NOTE — Hospital Course (Signed)
71 year old F with PMH of vocal cord dysfunction, diastolic CHF, LYY-5K, CAD, DM-2, GERD, anxiety, depression, obesity and seizure disorder presenting with shortness of breath and dry cough and admitted for "COPD exacerbation".  Multiple ED visits and hospitalizations for respiratory distress/COPD/asthma exacerbation.  Had PFT in 2019 reported as severe restriction with disproportionate reduction in ERV c/w body habitus.  She is followed by Dr. Leonides Schanz with [facility] pulmonology. ? ?Patient was started on systemic steroid and nebulizers.  Pulmonology consulted and felt her symptoms to be related to acid reflux.  Patient was continued on p.o. Protonix 40 mg twice daily.  Patient's symptoms improved.  She is discharged on prednisone taper and p.o. Protonix 40 mg twice daily.  Ambulatory referral to GI ordered.  ? ?Patient was ambulated on room air and maintained saturation above 91%.  ? ?

## 2021-08-24 NOTE — Assessment & Plan Note (Signed)
Suspected to be due to GERD/reflux. ?-Discharged on prednisone taper per pulmonology recommendations ?-Patient to continue home Pulmicort and Perforomist with as needed albuterol. ?-P.o. Protonix 40 mg twice daily ?-Ambulatory referral to GI ordered. ?

## 2021-08-24 NOTE — Care Management Important Message (Signed)
Important Message ? ?Patient Details IM Letter given to the Patient ?Name: [name] ?MRN: [id number] ?Date of Birth: Sep 22, 1950 ? ? ?Medicare Important Message Given:  Yes ? ? ? ? ?Kerin Salen ?08/24/2021, 8:38 AM ?

## 2021-08-29 ENCOUNTER — Telehealth: Payer: Self-pay

## 2021-08-29 ENCOUNTER — Ambulatory Visit: Payer: Medicare Other | Attending: Gastroenterology

## 2021-08-29 NOTE — Telephone Encounter (Signed)
SLP called pt due to no-show for her last scheduled appointment for vocal cord dysfunction (VCD). Pt had no more scheduled appointments so SLP LM for pt and strongly encouraged her to call and schedule at least two additional weeks of ST at x2/week. SLP provided clinic number for pt as well. ?

## 2021-08-29 NOTE — Telephone Encounter (Signed)
Fine with me

## 2021-08-30 ENCOUNTER — Ambulatory Visit: Payer: Medicare Other | Admitting: Podiatry

## 2021-08-30 ENCOUNTER — Telehealth: Payer: Self-pay | Admitting: Internal Medicine

## 2021-08-30 NOTE — Telephone Encounter (Signed)
Recv'd records from [facility] [place] forwarded 6 pages to Dr. Christinia Gully 5/2/23fbg ? ?

## 2021-08-31 NOTE — Progress Notes (Signed)
? ? ?HISTORY OF PRESENT ILLNESS:YY ?[name] is a 71 years old left-handed female, seen in refer by her primary care doctor [name] for evaluation of episodes of confusion on January 27 2016. ? ?I reviewed and summarized the referring note, she had a history of type 2 diabetes, hypertension, hyperlipidemia, pulmonary emboli and, depression, COPD, coronary artery disease, stent placement following a heart attack in January 18 2016, I reviewed echo report on January 18 2016, ejection fraction 65-70%, severe concentric hypertrophy, septal wall thickness was increased, with mild hypertrophy of the posterior wall, abnormal relaxation, increased filling pressure. ? ?Laboratory evaluation BMP showed elevated glucose 270, creatinine 1.06, cholesterol 207, LDL 110, A1c 7.0, ?  ?She had her first generalized seizure in 2011, was taken to [place] evaluated by neurologist there, per patient, there was a suggestion of antiepileptic medications, however it was never followed through. ?  ?Over the years, she had no generalized seizure, but every few weeks to every few months, she would have episodes of sudden onset confusion, space out, lasting less than 5 minutes ?  ?She went through extreme stress (her grandson died) in 05-23-2015, since then, she began to have increased spells, almost daily basis now, she noted when she was reading, cooking, has transient time elapsed, she came to confused, sometimes preceded by nausea dry mouth sensation. ?She denies family history of seizure, she works as a Midwife at [employer]. ?  ?UPDATE May 11 2016:YY She is now taking Depakote ER 500 mg every night, which has helped her tremendously, she no longer has recurrent spells, ? EEG was normal in October 2017 ? We have personally reviewed MRI of the brain in November 2017, multiple periventricular and subcortical punctate scattered chronic small vessel disease, increased T2 flare hyperintensity in  the mesial temporal lobe, right hippocampus is slightly smaller than the left, ?  ?Update March 20, 2019: ?She had no recurrent seizure, last seizure was in 2016, tolerating Depakote ER 500 mg 2 tablets at nighttime, ?  ?Today her main concern is intermittent memory loss, she retired as a Chief Financial Officer recently, she was noted to have difficulty for the store layout since 2019, she denied a family history of dementia.  ? ?UPDATE August 2021: ?She retired recently, no recurrent seizure, personally reviewed MRI of the brain without contrast, generalized atrophy, supratentorial small vessel disease, no change compared to previous scan in 2017, ?ORI g or due to elevated intracranial pressures.  It appears stable compared to the previous MRI. ?Laboratory evaluation May 2021, A1c 6.5, BMP showed elevated creatinine 1.5, glucose 307, potassium 3.3, CBC showed mild decreased hemoglobin of 11.1, November 2020, normal TSH, B12, RPR, depakote level 80s.  While taking Depakote ER 500 mg 2 every night ? ?She has some memory complaints, was put on Namenda 10 mg twice a day tolerating it well ? ?Update August 26, 2020 SS: Doing well on lower dose Depakote ER 500 mg at bedtime, no seizures. MMSE 26/30, doing well on Namenda 10 mg twice daily. Living with husband. Driving car, doing everything for herself. For memory, sometimes can't find the words. In hospital in Feb, had COVID, asthma flare. Depakote level was 34 in Feb, creatinine 1.56. No complaints today.  ? ?Update Sep 01, 2021 SS: MMSE 27/30. Rough year, since Dec 2022 in hospital 8 times for asthma. Husband diagnosed with stomach cancer, had recent stroke, he is at home. She just got discharged 1 week ago for asthma. Driving,  feels memory is doing good, feels mentally exhausted. No seizures, only the 1 in 2017, remains on Depakote ER 500 mg at bedtime, on Namenda 10 mg twice daily (since 2021).  ? ?REVIEW OF SYSTEMS: Out of a complete 14 system review of  symptoms, the patient complains only of the following symptoms, and all other reviewed systems are negative. ? ?N/A ? ?ALLERGIES: ?Allergies  ?Allergen Reactions  ? Tramadol Other (See Comments)  ?  Not to be taken because of existing seizure disorder/SEIZURES ?  ? Chlorthalidone Other (See Comments)  ?  Was stopped due to kidney function  ? ? ?HOME MEDICATIONS: ?Outpatient Medications Prior to Visit  ?Medication Sig Dispense Refill  ? acetaminophen-codeine (TYLENOL #3) 300-30 MG tablet Take 1 tablet by mouth every 6 (six) hours as needed for moderate pain or severe pain.    ? albuterol (PROAIR HFA) 108 (90 Base) MCG/ACT inhaler Inhale 2 puffs into the lungs every 4 (four) hours as needed for wheezing or shortness of breath. 18 g 2  ? albuterol (PROVENTIL) (2.5 MG/3ML) 0.083% nebulizer solution Take 3 mLs (2.5 mg total) by nebulization every 4 (four) hours as needed for wheezing or shortness of breath. 75 mL 12  ? ALPRAZolam (XANAX) 0.5 MG tablet Take 1 tablet (0.5 mg total) by mouth 2 (two) times daily as needed for anxiety. (Patient taking differently: Take 0.5 mg by mouth See admin instructions. Take 0.5 mg by mouth at bedtime and an additional 0.5 mg once a day as needed for anxiety) 30 tablet 0  ? atorvastatin (LIPITOR) 40 MG tablet Take 40 mg by mouth at bedtime.    ? Biotin 5000 MCG CAPS Take 5,000 mcg by mouth daily.    ? budesonide (PULMICORT) 0.5 MG/2ML nebulizer solution USE 1 VIAL IN NEBULIZER IN THE MORNING AND AT BEDTIME (Patient taking differently: Take 0.5 mg by nebulization in the morning and at bedtime.) 120 mL 11  ? diltiazem (CARDIZEM CD) 240 MG 24 hr capsule TAKE 1 CAPSULE BY MOUTH DAILY (Patient taking differently: Take 240 mg by mouth daily.) 90 capsule 3  ? divalproex (DEPAKOTE ER) 500 MG 24 hr tablet TAKE 1 TABLET BY MOUTH AT  BEDTIME (Patient taking differently: Take 500 mg by mouth at bedtime.) 90 tablet 3  ? ferrous sulfate 325 (65 FE) MG tablet Take 325 mg by mouth daily with breakfast.     ? fluticasone (FLONASE) 50 MCG/ACT nasal spray Place 2 sprays into both nostrils daily as needed for allergies.    ? formoterol (PERFOROMIST) 20 MCG/2ML nebulizer solution USE 1 VIAL  IN  NEBULIZER TWICE  DAILY - morning and evening (Patient taking differently: Take 20 mcg by nebulization in the morning and at bedtime.) 120 mL 11  ? glipiZIDE (GLUCOTROL) 5 MG tablet Take 2 tablets (10 mg total) by mouth daily before breakfast for 5 days, THEN 1 tablet (5 mg total) daily before breakfast. 95 tablet 0  ? hydrALAZINE (APRESOLINE) 100 MG tablet TAKE 1 TABLET BY MOUTH 3 TIMES  DAILY (Patient taking differently: Take 100 mg by mouth 3 (three) times daily.) 270 tablet 3  ? Inulin (FIBER CHOICE PO) Take 1 capsule by mouth daily.    ? ketoconazole (NIZORAL) 2 % cream Apply to both feet and between toes once daily for 6 weeks for Athlete's feet. (Patient taking differently: Apply 1 application. topically See admin instructions. Apply to both feet and between toes once daily for 6 weeks for Athlete's feet.) 60 g 1  ? KLOR-CON  M20 20 MEQ tablet Take 20 mEq by mouth every other day.    ? latanoprost (XALATAN) 0.005 % ophthalmic solution Place 1 drop into both eyes at bedtime.    ? loratadine (CLARITIN) 10 MG tablet Take 10 mg by mouth daily.    ? magnesium oxide (MAG-OX) 400 (240 Mg) MG tablet Take 1 tablet (400 mg total) by mouth daily. 60 tablet 0  ? memantine (NAMENDA) 10 MG tablet Take 1 tablet (10 mg total) by mouth 2 (two) times daily. 180 tablet 1  ? metFORMIN (GLUCOPHAGE) 1000 MG tablet Take 1,000 mg by mouth 2 (two) times daily with a meal.    ? montelukast (SINGULAIR) 10 MG tablet Take 1 tablet (10 mg total) by mouth at bedtime. 90 tablet 3  ? Multiple Vitamins-Minerals (ONE-A-DAY WOMENS PO) Take 1 tablet by mouth daily with breakfast.    ? nebivolol (BYSTOLIC) 10 MG tablet Take 1 tablet (10 mg total) by mouth daily. 34 tablet 11  ? nitroGLYCERIN (NITROSTAT) 0.4 MG SL tablet Place 1 tablet (0.4 mg total) under the  tongue every 5 (five) minutes as needed for chest pain. Don't exceed more than 3 doses in 15 mins 30 tablet 0  ? pantoprazole (PROTONIX) 40 MG tablet Take 1 tablet (40 mg total) by mouth 2 (two) times dai

## 2021-09-01 ENCOUNTER — Encounter: Payer: Self-pay | Admitting: Neurology

## 2021-09-01 ENCOUNTER — Ambulatory Visit: Payer: Medicare Other | Admitting: Neurology

## 2021-09-01 VITALS — BP 158/74 | HR 82 | Ht 62.0 in | Wt 175.0 lb

## 2021-09-01 DIAGNOSIS — R569 Unspecified convulsions: Secondary | ICD-10-CM | POA: Diagnosis not present

## 2021-09-01 DIAGNOSIS — G3184 Mild cognitive impairment, so stated: Secondary | ICD-10-CM

## 2021-09-01 MED ORDER — MEMANTINE HCL 10 MG PO TABS: 10.0000 mg | ORAL_TABLET | Freq: Two times a day (BID) | ORAL | 3 refills | Status: DC

## 2021-09-05 ENCOUNTER — Other Ambulatory Visit: Payer: Self-pay | Admitting: Neurology

## 2021-09-29 ENCOUNTER — Other Ambulatory Visit: Payer: Self-pay

## 2021-09-29 ENCOUNTER — Encounter (HOSPITAL_COMMUNITY): Payer: Self-pay | Admitting: Emergency Medicine

## 2021-09-29 ENCOUNTER — Inpatient Hospital Stay (HOSPITAL_COMMUNITY)
Admission: EM | Admit: 2021-09-29 | Discharge: 2021-10-02 | DRG: 683 | Disposition: A | Discharge status: Home / Self Care | Payer: Medicare Other | Attending: Internal Medicine | Admitting: Internal Medicine

## 2021-09-29 ENCOUNTER — Emergency Department (HOSPITAL_COMMUNITY): Payer: Medicare Other

## 2021-09-29 DIAGNOSIS — G3184 Mild cognitive impairment, so stated: Secondary | ICD-10-CM | POA: Diagnosis present

## 2021-09-29 DIAGNOSIS — E1165 Type 2 diabetes mellitus with hyperglycemia: Secondary | ICD-10-CM | POA: Diagnosis present

## 2021-09-29 DIAGNOSIS — A059 Bacterial foodborne intoxication, unspecified: Secondary | ICD-10-CM | POA: Diagnosis present

## 2021-09-29 DIAGNOSIS — E1122 Type 2 diabetes mellitus with diabetic chronic kidney disease: Secondary | ICD-10-CM | POA: Diagnosis present

## 2021-09-29 DIAGNOSIS — Z833 Family history of diabetes mellitus: Secondary | ICD-10-CM

## 2021-09-29 DIAGNOSIS — I130 Hypertensive heart and chronic kidney disease with heart failure and stage 1 through stage 4 chronic kidney disease, or unspecified chronic kidney disease: Secondary | ICD-10-CM | POA: Diagnosis present

## 2021-09-29 DIAGNOSIS — K219 Gastro-esophageal reflux disease without esophagitis: Secondary | ICD-10-CM | POA: Diagnosis present

## 2021-09-29 DIAGNOSIS — N1831 Chronic kidney disease, stage 3a: Secondary | ICD-10-CM | POA: Diagnosis present

## 2021-09-29 DIAGNOSIS — Z825 Family history of asthma and other chronic lower respiratory diseases: Secondary | ICD-10-CM

## 2021-09-29 DIAGNOSIS — F419 Anxiety disorder, unspecified: Secondary | ICD-10-CM | POA: Diagnosis present

## 2021-09-29 DIAGNOSIS — I5032 Chronic diastolic (congestive) heart failure: Secondary | ICD-10-CM | POA: Diagnosis present

## 2021-09-29 DIAGNOSIS — M1990 Unspecified osteoarthritis, unspecified site: Secondary | ICD-10-CM | POA: Diagnosis present

## 2021-09-29 DIAGNOSIS — J4550 Severe persistent asthma, uncomplicated: Secondary | ICD-10-CM | POA: Diagnosis present

## 2021-09-29 DIAGNOSIS — Z87891 Personal history of nicotine dependence: Secondary | ICD-10-CM

## 2021-09-29 DIAGNOSIS — R197 Diarrhea, unspecified: Secondary | ICD-10-CM | POA: Diagnosis present

## 2021-09-29 DIAGNOSIS — R569 Unspecified convulsions: Secondary | ICD-10-CM

## 2021-09-29 DIAGNOSIS — I252 Old myocardial infarction: Secondary | ICD-10-CM

## 2021-09-29 DIAGNOSIS — Z955 Presence of coronary angioplasty implant and graft: Secondary | ICD-10-CM

## 2021-09-29 DIAGNOSIS — Z6832 Body mass index (BMI) 32.0-32.9, adult: Secondary | ICD-10-CM

## 2021-09-29 DIAGNOSIS — H409 Unspecified glaucoma: Secondary | ICD-10-CM | POA: Diagnosis present

## 2021-09-29 DIAGNOSIS — Z79899 Other long term (current) drug therapy: Secondary | ICD-10-CM

## 2021-09-29 DIAGNOSIS — J383 Other diseases of vocal cords: Secondary | ICD-10-CM | POA: Diagnosis present

## 2021-09-29 DIAGNOSIS — Z7951 Long term (current) use of inhaled steroids: Secondary | ICD-10-CM

## 2021-09-29 DIAGNOSIS — E669 Obesity, unspecified: Secondary | ICD-10-CM | POA: Diagnosis present

## 2021-09-29 DIAGNOSIS — I2510 Atherosclerotic heart disease of native coronary artery without angina pectoris: Secondary | ICD-10-CM | POA: Diagnosis present

## 2021-09-29 DIAGNOSIS — R1084 Generalized abdominal pain: Principal | ICD-10-CM

## 2021-09-29 DIAGNOSIS — E860 Dehydration: Secondary | ICD-10-CM | POA: Diagnosis present

## 2021-09-29 DIAGNOSIS — N179 Acute kidney failure, unspecified: Principal | ICD-10-CM | POA: Diagnosis present

## 2021-09-29 DIAGNOSIS — G40909 Epilepsy, unspecified, not intractable, without status epilepticus: Secondary | ICD-10-CM

## 2021-09-29 DIAGNOSIS — R109 Unspecified abdominal pain: Secondary | ICD-10-CM | POA: Diagnosis present

## 2021-09-29 DIAGNOSIS — F32A Depression, unspecified: Secondary | ICD-10-CM | POA: Diagnosis present

## 2021-09-29 DIAGNOSIS — Z7984 Long term (current) use of oral hypoglycemic drugs: Secondary | ICD-10-CM

## 2021-09-29 DIAGNOSIS — I10 Essential (primary) hypertension: Secondary | ICD-10-CM | POA: Diagnosis present

## 2021-09-29 DIAGNOSIS — Z888 Allergy status to other drugs, medicaments and biological substances status: Secondary | ICD-10-CM

## 2021-09-29 DIAGNOSIS — Z885 Allergy status to narcotic agent status: Secondary | ICD-10-CM

## 2021-09-29 DIAGNOSIS — F0390 Unspecified dementia without behavioral disturbance: Secondary | ICD-10-CM | POA: Diagnosis present

## 2021-09-29 DIAGNOSIS — E785 Hyperlipidemia, unspecified: Secondary | ICD-10-CM | POA: Diagnosis present

## 2021-09-29 DIAGNOSIS — Z90710 Acquired absence of both cervix and uterus: Secondary | ICD-10-CM

## 2021-09-29 DIAGNOSIS — Z8249 Family history of ischemic heart disease and other diseases of the circulatory system: Secondary | ICD-10-CM

## 2021-09-29 DIAGNOSIS — E119 Type 2 diabetes mellitus without complications: Secondary | ICD-10-CM

## 2021-09-29 LAB — CBC WITH DIFFERENTIAL/PLATELET
Abs Immature Granulocytes: 0.04 10*3/uL (ref 0.00–0.07)
Basophils Absolute: 0.1 10*3/uL (ref 0.0–0.1)
Basophils Relative: 1 %
Eosinophils Absolute: 0 10*3/uL (ref 0.0–0.5)
Eosinophils Relative: 0 %
HCT: 38.6 % (ref 36.0–46.0)
Hemoglobin: 12.5 g/dL (ref 12.0–15.0)
Immature Granulocytes: 0 %
Lymphocytes Relative: 32 %
Lymphs Abs: 2.9 10*3/uL (ref 0.7–4.0)
MCH: 31.6 pg (ref 26.0–34.0)
MCHC: 32.4 g/dL (ref 30.0–36.0)
MCV: 97.7 fL (ref 80.0–100.0)
Monocytes Absolute: 0.8 10*3/uL (ref 0.1–1.0)
Monocytes Relative: 9 %
Neutro Abs: 5.1 10*3/uL (ref 1.7–7.7)
Neutrophils Relative %: 58 %
Platelets: 345 10*3/uL (ref 150–400)
RBC: 3.95 MIL/uL (ref 3.87–5.11)
RDW: 13.4 % (ref 11.5–15.5)
WBC: 9 10*3/uL (ref 4.0–10.5)
nRBC: 0 % (ref 0.0–0.2)

## 2021-09-29 LAB — COMPREHENSIVE METABOLIC PANEL
ALT: 16 U/L (ref 0–44)
AST: 17 U/L (ref 15–41)
Albumin: 4.2 g/dL (ref 3.5–5.0)
Alkaline Phosphatase: 55 U/L (ref 38–126)
Anion gap: 12 (ref 5–15)
BUN: 35 mg/dL — ABNORMAL HIGH (ref 8–23)
CO2: 23 mmol/L (ref 22–32)
Calcium: 9.6 mg/dL (ref 8.9–10.3)
Chloride: 106 mmol/L (ref 98–111)
Creatinine, Ser: 2.09 mg/dL — ABNORMAL HIGH (ref 0.44–1.00)
GFR, Estimated: 25 mL/min — ABNORMAL LOW (ref >60)
Glucose, Bld: 141 mg/dL — ABNORMAL HIGH (ref 70–99)
Potassium: 4 mmol/L (ref 3.5–5.1)
Sodium: 141 mmol/L (ref 135–145)
Total Bilirubin: 0.6 mg/dL (ref 0.3–1.2)
Total Protein: 7.1 g/dL (ref 6.5–8.1)

## 2021-09-29 LAB — URINALYSIS, ROUTINE W REFLEX MICROSCOPIC
Bilirubin Urine: NEGATIVE
Glucose, UA: NEGATIVE mg/dL
Hgb urine dipstick: NEGATIVE
Ketones, ur: NEGATIVE mg/dL
Leukocytes,Ua: NEGATIVE
Nitrite: NEGATIVE
Protein, ur: NEGATIVE mg/dL
Specific Gravity, Urine: 1.015 (ref 1.005–1.030)
pH: 5 (ref 5.0–8.0)

## 2021-09-29 LAB — LIPASE, BLOOD: Lipase: 24 U/L (ref 11–51)

## 2021-09-29 LAB — GLUCOSE, CAPILLARY
Glucose-Capillary: 140 mg/dL — ABNORMAL HIGH (ref 70–99)
Glucose-Capillary: 151 mg/dL — ABNORMAL HIGH (ref 70–99)

## 2021-09-29 LAB — LACTIC ACID, PLASMA: Lactic Acid, Venous: 1.4 mmol/L (ref 0.5–1.9)

## 2021-09-29 MED ORDER — ENOXAPARIN SODIUM 30 MG/0.3ML IJ SOSY
30.0000 mg | PREFILLED_SYRINGE | INTRAMUSCULAR | Status: DC
  Administered 2021-09-29 – 2021-09-30 (×2): 30 mg via SUBCUTANEOUS
  Filled 2021-09-29 (×2): qty 0.3

## 2021-09-29 MED ORDER — HYOSCYAMINE SULFATE 0.125 MG SL SUBL
0.1250 mg | SUBLINGUAL_TABLET | Freq: Once | SUBLINGUAL | Status: AC
  Administered 2021-09-29: 0.125 mg via ORAL
  Filled 2021-09-29: qty 1

## 2021-09-29 MED ORDER — SODIUM CHLORIDE 0.9 % IV SOLN: Freq: Once | INTRAVENOUS | Status: DC

## 2021-09-29 MED ORDER — INSULIN ASPART 100 UNIT/ML IJ SOLN: 0.0000 [IU] | Freq: Every day | INTRAMUSCULAR | Status: DC

## 2021-09-29 MED ORDER — ATORVASTATIN CALCIUM 40 MG PO TABS
40.0000 mg | ORAL_TABLET | Freq: Every day | ORAL | Status: DC
  Administered 2021-09-29 – 2021-10-01 (×3): 40 mg via ORAL
  Filled 2021-09-29 (×3): qty 1

## 2021-09-29 MED ORDER — ONDANSETRON HCL 4 MG/2ML IJ SOLN
4.0000 mg | Freq: Four times a day (QID) | INTRAMUSCULAR | Status: DC | PRN
  Administered 2021-10-01: 4 mg via INTRAVENOUS
  Filled 2021-09-29: qty 2

## 2021-09-29 MED ORDER — ALPRAZOLAM 0.5 MG PO TABS
0.5000 mg | ORAL_TABLET | Freq: Two times a day (BID) | ORAL | Status: DC | PRN
  Administered 2021-09-29 – 2021-10-01 (×3): 0.5 mg via ORAL
  Filled 2021-09-29 (×3): qty 1

## 2021-09-29 MED ORDER — EQ PROBIOTIC PO CAPS: 1.0000 | ORAL_CAPSULE | Freq: Every morning | ORAL | Status: DC

## 2021-09-29 MED ORDER — ACETAMINOPHEN 650 MG RE SUPP: 650.0000 mg | Freq: Four times a day (QID) | RECTAL | Status: DC | PRN

## 2021-09-29 MED ORDER — HYDRALAZINE HCL 20 MG/ML IJ SOLN: 5.0000 mg | INTRAMUSCULAR | Status: DC | PRN

## 2021-09-29 MED ORDER — NEBIVOLOL HCL 10 MG PO TABS
10.0000 mg | ORAL_TABLET | Freq: Every day | ORAL | Status: DC
  Administered 2021-09-29 – 2021-10-02 (×4): 10 mg via ORAL
  Filled 2021-09-29 (×5): qty 1

## 2021-09-29 MED ORDER — PANTOPRAZOLE SODIUM 40 MG PO TBEC
40.0000 mg | DELAYED_RELEASE_TABLET | Freq: Two times a day (BID) | ORAL | Status: DC
  Administered 2021-09-29 – 2021-10-02 (×6): 40 mg via ORAL
  Filled 2021-09-29 (×6): qty 1

## 2021-09-29 MED ORDER — ARFORMOTEROL TARTRATE 15 MCG/2ML IN NEBU
15.0000 ug | INHALATION_SOLUTION | Freq: Two times a day (BID) | RESPIRATORY_TRACT | Status: DC
  Administered 2021-09-29 – 2021-09-30 (×2): 15 ug via RESPIRATORY_TRACT
  Filled 2021-09-29 (×2): qty 2

## 2021-09-29 MED ORDER — INSULIN ASPART 100 UNIT/ML IJ SOLN
0.0000 [IU] | Freq: Three times a day (TID) | INTRAMUSCULAR | Status: DC
  Administered 2021-09-29: 2 [IU] via SUBCUTANEOUS
  Administered 2021-09-30 (×3): 3 [IU] via SUBCUTANEOUS
  Administered 2021-10-01: 2 [IU] via SUBCUTANEOUS
  Administered 2021-10-01: 3 [IU] via SUBCUTANEOUS
  Administered 2021-10-02: 2 [IU] via SUBCUTANEOUS

## 2021-09-29 MED ORDER — MORPHINE SULFATE (PF) 2 MG/ML IV SOLN
2.0000 mg | INTRAVENOUS | Status: DC | PRN
  Administered 2021-09-29 – 2021-10-01 (×12): 2 mg via INTRAVENOUS
  Filled 2021-09-29 (×13): qty 1

## 2021-09-29 MED ORDER — LACTATED RINGERS IV SOLN: INTRAVENOUS | Status: AC

## 2021-09-29 MED ORDER — DIVALPROEX SODIUM ER 500 MG PO TB24
500.0000 mg | ORAL_TABLET | Freq: Every day | ORAL | Status: DC
  Administered 2021-09-29 – 2021-10-01 (×3): 500 mg via ORAL
  Filled 2021-09-29 (×3): qty 1

## 2021-09-29 MED ORDER — LATANOPROST 0.005 % OP SOLN
1.0000 [drp] | Freq: Every day | OPHTHALMIC | Status: DC
  Administered 2021-09-29 – 2021-10-01 (×3): 1 [drp] via OPHTHALMIC
  Filled 2021-09-29: qty 2.5

## 2021-09-29 MED ORDER — BUDESONIDE 0.5 MG/2ML IN SUSP
0.5000 mg | Freq: Every day | RESPIRATORY_TRACT | Status: DC
Start: 2021-09-29 — End: 2021-10-02
  Administered 2021-09-30 – 2021-10-02 (×3): 0.5 mg via RESPIRATORY_TRACT
  Filled 2021-09-29 (×3): qty 2

## 2021-09-29 MED ORDER — ALBUTEROL SULFATE (2.5 MG/3ML) 0.083% IN NEBU: 2.5000 mg | INHALATION_SOLUTION | RESPIRATORY_TRACT | Status: DC | PRN

## 2021-09-29 MED ORDER — HYDRALAZINE HCL 50 MG PO TABS
100.0000 mg | ORAL_TABLET | Freq: Three times a day (TID) | ORAL | Status: DC
  Administered 2021-09-29 – 2021-10-02 (×9): 100 mg via ORAL
  Filled 2021-09-29 (×9): qty 2

## 2021-09-29 MED ORDER — MEMANTINE HCL 10 MG PO TABS
10.0000 mg | ORAL_TABLET | Freq: Two times a day (BID) | ORAL | Status: DC
  Administered 2021-09-29 – 2021-10-02 (×6): 10 mg via ORAL
  Filled 2021-09-29 (×8): qty 1

## 2021-09-29 MED ORDER — SODIUM CHLORIDE 0.9 % IV BOLUS
1000.0000 mL | Freq: Once | INTRAVENOUS | Status: AC
  Administered 2021-09-29: 1000 mL via INTRAVENOUS

## 2021-09-29 MED ORDER — HYDROMORPHONE HCL 1 MG/ML IJ SOLN
0.5000 mg | Freq: Once | INTRAMUSCULAR | Status: AC
  Administered 2021-09-29: 0.5 mg via INTRAVENOUS
  Filled 2021-09-29: qty 1

## 2021-09-29 MED ORDER — MONTELUKAST SODIUM 10 MG PO TABS
10.0000 mg | ORAL_TABLET | Freq: Every day | ORAL | Status: DC
  Administered 2021-09-29 – 2021-10-01 (×3): 10 mg via ORAL
  Filled 2021-09-29 (×3): qty 1

## 2021-09-29 MED ORDER — HYDROMORPHONE HCL 1 MG/ML IJ SOLN
0.5000 mg | Freq: Once | INTRAMUSCULAR | Status: AC
Start: 2021-09-29 — End: 2021-09-29
  Administered 2021-09-29: 0.5 mg via INTRAVENOUS
  Filled 2021-09-29: qty 1

## 2021-09-29 MED ORDER — ONDANSETRON HCL 4 MG/2ML IJ SOLN
4.0000 mg | Freq: Once | INTRAMUSCULAR | Status: AC
  Administered 2021-09-29: 4 mg via INTRAVENOUS
  Filled 2021-09-29: qty 2

## 2021-09-29 MED ORDER — DILTIAZEM HCL ER COATED BEADS 120 MG PO CP24
240.0000 mg | ORAL_CAPSULE | Freq: Every day | ORAL | Status: DC
  Administered 2021-09-30 – 2021-10-02 (×3): 240 mg via ORAL
  Filled 2021-09-29: qty 2
  Filled 2021-09-29: qty 1
  Filled 2021-09-29 (×2): qty 2

## 2021-09-29 MED ORDER — ONDANSETRON HCL 4 MG PO TABS
4.0000 mg | ORAL_TABLET | Freq: Four times a day (QID) | ORAL | Status: DC | PRN
  Administered 2021-09-29: 4 mg via ORAL
  Filled 2021-09-29: qty 1

## 2021-09-29 MED ORDER — LORATADINE 10 MG PO TABS
10.0000 mg | ORAL_TABLET | Freq: Every day | ORAL | Status: DC
  Administered 2021-09-30 – 2021-10-02 (×3): 10 mg via ORAL
  Filled 2021-09-29 (×4): qty 1

## 2021-09-29 MED ORDER — ACETAMINOPHEN 325 MG PO TABS
650.0000 mg | ORAL_TABLET | Freq: Four times a day (QID) | ORAL | Status: DC | PRN
  Administered 2021-10-01: 650 mg via ORAL
  Filled 2021-09-29: qty 2

## 2021-09-29 NOTE — H&P (Signed)
History and Physical    Patient: [name] JQB:[id number] DOB: [birth year] DOA: 09/29/2021 DOS: the patient was seen and examined on 09/29/2021 PCP: [name], MD  Patient coming from: Home - lives with husband and granddaughter; NOK: Husband, [phone]   Chief Complaint: Abdominal pain  HPI: [name] is a 71 y.o. female with medical history significant of chronic diastolic CHF; CAD; asthma; dementia; HTN; and stage 3 CKD presenting with diarrhea and abdominal pain. Tuesday, she went to get a steak biscuit from [employer].  Her stomach starting gurgling and her stool was black - but takes iron and it has been black on the iron.  It kept going on that day.  Yesterday, the same thing happened and she just couldn't eat.  She had some soup and she had immediate diarrhea.  Last night, it happened again and she decided to come to the ER.  The pain was excruciating in the mid-epigastric region and radiated into her back.  Her last stool was last night.  Her pain is controlled now with recent pain medication given.  She has been able to tolerate ginger ale and saltines in the ER.    ER Course:  Diarrhea, abdominal pain.  CT unremarkable.  Mild AKI from dehydration - creatinine 1.18 to 2.  Every time she tries to take PO, she has severe cramping.  Diarrhea appears to have resolved but stool studies ordered just in case.  Needs obs for inability to tolerate PO with AKI.     Review of Systems: As mentioned in the history of present illness. All other systems reviewed and are negative. Past Medical History:  Diagnosis Date   Anxiety    Arthritis    Asthma    Chronic diastolic CHF (congestive heart failure) ([place]) 07/17/2016   Coronary artery disease    a. NSTEMI 12/2015 - LHC 01/18/16: s/p overlapping DESx2 to RCA, 10% ost-prox Cx,10% mLAD.   Dementia ([place])    Depression    Diabetes mellitus    Dyslipidemia    GERD (gastroesophageal reflux disease)    History of seizure     Hypertension    Hypertensive heart disease    NSTEMI (non-ST elevated myocardial infarction) ([place]) 12/2015   Stage 3 chronic kidney disease (HCC)    Past Surgical History:  Procedure Laterality Date   ABDOMINAL HYSTERECTOMY     CARDIAC CATHETERIZATION  1994   minimal LAD dz, no other dz, EF normal   CARDIAC CATHETERIZATION N/A 01/18/2016   Procedure: Left Heart Cath and Coronary Angiography;  Surgeon: Burnell Blanks, MD;  Location: [facility];  Service: Cardiovascular;  Laterality: N/A;   CARDIAC CATHETERIZATION N/A 01/18/2016   Procedure: Coronary Stent Intervention;  Surgeon: Burnell Blanks, MD;  Location: [facility];  Service: Cardiovascular;  Laterality: N/A;   COLONOSCOPY WITH PROPOFOL N/A 04/09/2014   Procedure: COLONOSCOPY WITH PROPOFOL;  Surgeon: Cleotis Nipper, MD;  Location: [facility];  Service: Endoscopy;  Laterality: N/A;   CORONARY STENT PLACEMENT  01/19/2016   STENT SYNERGY DES 2.42A83 drug eluting stent was successfully placed, and overlaps the 2.5 x 38 mm Synergy stent placed distally.   ESOPHAGOGASTRODUODENOSCOPY (EGD) WITH PROPOFOL N/A 04/09/2014   Procedure: ESOPHAGOGASTRODUODENOSCOPY (EGD) WITH PROPOFOL;  Surgeon: Cleotis Nipper, MD;  Location: [facility];  Service: Endoscopy;  Laterality: N/A;   FOOT SURGERY Bilateral    approx ~2000   HEMORRHOID SURGERY     HERNIA REPAIR     KNEE ARTHROSCOPY  RADIOACTIVE SEED GUIDED EXCISIONAL BREAST BIOPSY Left 01/20/2021   Procedure: RADIOACTIVE SEED GUIDED EXCISIONAL LEFT BREAST BIOPSY;  Surgeon: Stark Klein, MD;  Location: [facility];  Service: General;  Laterality: Left;   Social History:  reports that she quit smoking about 17 years ago. Her smoking use included cigarettes. She has a 30.00 pack-year smoking history. She has never used smokeless tobacco. She reports that she does not drink alcohol and does not use drugs.  Allergies  Allergen Reactions   Tramadol Other (See Comments)     Not to be taken because of existing seizure disorder/SEIZURES    Chlorthalidone Other (See Comments)    Was stopped due to kidney function    Family History  Problem Relation Age of Onset   Allergies Mother    Asthma Mother    CAD Father 12   Diabetes Father    Hypertension Father    Congestive Heart Failure Sister        died in her 27s   CAD Sister 57    Prior to Admission medications   Medication Sig Start Date End Date Taking? Authorizing Provider  acetaminophen-codeine (TYLENOL #3) 300-30 MG tablet Take 1 tablet by mouth every 6 (six) hours as needed for moderate pain or severe pain. 07/23/21  Yes [provider]  albuterol (PROAIR HFA) 108 (90 Base) MCG/ACT inhaler Inhale 2 puffs into the lungs every 4 (four) hours as needed for wheezing or shortness of breath. 04/20/21  Yes Parrett, [name] S, NP  albuterol (PROVENTIL) (2.5 MG/3ML) 0.083% nebulizer solution Take 3 mLs (2.5 mg total) by nebulization every 4 (four) hours as needed for wheezing or shortness of breath. 05/17/21  Yes Tanda Rockers, MD  ALPRAZolam Duanne Moron) 0.5 MG tablet Take 1 tablet (0.5 mg total) by mouth 2 (two) times daily as needed for anxiety. Patient taking differently: Take 0.5 mg by mouth See admin instructions. Take 0.5 mg by mouth at bedtime and an additional 0.5 mg once a day as needed for anxiety 07/31/15  Yes Theodis Blaze, MD  atorvastatin (LIPITOR) 40 MG tablet Take 40 mg by mouth at bedtime.   Yes [provider]  Biotin 5000 MCG CAPS Take 5,000 mcg by mouth daily.   Yes [provider]  budesonide (PULMICORT) 0.5 MG/2ML nebulizer solution USE 1 VIAL IN NEBULIZER IN THE MORNING AND AT BEDTIME Patient taking differently: Take 0.5 mg by nebulization in the morning and at bedtime. 03/29/21  Yes Tanda Rockers, MD  diltiazem (CARDIZEM CD) 240 MG 24 hr capsule TAKE 1 CAPSULE BY MOUTH DAILY Patient taking differently: Take 240 mg by mouth daily. 07/27/21  Yes Jerline Pain, MD   divalproex (DEPAKOTE ER) 500 MG 24 hr tablet TAKE 1 TABLET BY MOUTH AT  BEDTIME Patient taking differently: Take 500 mg by mouth at bedtime. 08/08/21  Yes Marcial Pacas, MD  ferrous sulfate 325 (65 FE) MG tablet Take 325 mg by mouth daily with breakfast. 07/15/21  Yes [provider]  fluticasone (FLONASE) 50 MCG/ACT nasal spray Place 2 sprays into both nostrils daily as needed for allergies.   Yes [provider]  formoterol (PERFOROMIST) 20 MCG/2ML nebulizer solution USE 1 VIAL  IN  NEBULIZER TWICE  DAILY - morning and evening Patient taking differently: Take 20 mcg by nebulization in the morning and at bedtime. 03/29/21  Yes Tanda Rockers, MD  glipiZIDE (GLUCOTROL) 5 MG tablet Take 2 tablets (10 mg total) by mouth daily before breakfast for 5 days,  THEN 1 tablet (5 mg total) daily before breakfast. 08/24/21 11/22/21 Yes Gonfa, Charlesetta Ivory, MD  hydrALAZINE (APRESOLINE) 100 MG tablet TAKE 1 TABLET BY MOUTH 3 TIMES  DAILY Patient taking differently: Take 100 mg by mouth 3 (three) times daily. 07/27/21  Yes Jerline Pain, MD  Inulin (FIBER CHOICE PO) Take 1 capsule by mouth daily.   Yes [provider]  KLOR-CON M20 20 MEQ tablet Take 20 mEq by mouth every other day. 07/17/21  Yes [provider]  latanoprost (XALATAN) 0.005 % ophthalmic solution Place 1 drop into both eyes at bedtime. 02/05/20  Yes [provider]  loratadine (CLARITIN) 10 MG tablet Take 10 mg by mouth daily.   Yes [provider]  magnesium oxide (MAG-OX) 400 (240 Mg) MG tablet Take 1 tablet (400 mg total) by mouth daily. 05/14/21  Yes Lavina Hamman, MD  memantine (NAMENDA) 10 MG tablet TAKE 1 TABLET BY MOUTH TWICE  DAILY 09/06/21  Yes Suzzanne Cloud, NP  metFORMIN (GLUCOPHAGE) 1000 MG tablet Take 1,000 mg by mouth 2 (two) times daily with a meal.   Yes [provider]  montelukast (SINGULAIR) 10 MG tablet Take 1 tablet (10 mg total) by mouth at bedtime. 05/25/21  Yes Tanda Rockers, MD  Multiple Vitamins-Minerals (ONE-A-DAY WOMENS PO) Take 1 tablet by mouth daily with breakfast.   Yes [provider]  nebivolol (BYSTOLIC) 10 MG tablet Take 1 tablet (10 mg total) by mouth daily. 04/10/19  Yes Jerline Pain, MD  nitroGLYCERIN (NITROSTAT) 0.4 MG SL tablet Place 1 tablet (0.4 mg total) under the tongue every 5 (five) minutes as needed for chest pain. Don't exceed more than 3 doses in 15 mins 06/24/21  Yes Aline August, MD  pantoprazole (PROTONIX) 40 MG tablet Take 1 tablet (40 mg total) by mouth 2 (two) times daily before a meal. 06/24/21  Yes Aline August, MD  Probiotic Product (EQ PROBIOTIC) CAPS Take 1 capsule by mouth every morning.   Yes [provider]  ketoconazole (NIZORAL) 2 % cream Apply to both feet and between toes once daily for 6 weeks for Athlete's feet. Patient not taking: Reported on 09/29/2021 08/01/21   Marzetta Board, DPM    Physical Exam: Vitals:   09/29/21 0545 09/29/21 0600 09/29/21 0830 09/29/21 1000  BP: (!) 152/74 (!) 147/91 (!) 146/69 (!) 152/64  Pulse: 88 86 76 84  Resp: '14 17 14 '$ (!) 23  Temp:      TempSrc:      SpO2: 99% 99% 98% 96%   General:  Appears calm and comfortable and is in NAD Eyes:  PERRL, EOMI, normal lids, iris ENT:  grossly normal hearing, lips & tongue, mmm Neck:  no LAD, masses or thyromegaly Cardiovascular:  RRR, no m/r/g. No LE edema.  Respiratory:   CTA bilaterally with no wheezes/rales/rhonchi.  Normal respiratory effort. Abdomen:  soft, diffusely TTP especially in midepigastric region with midepigastric distention Skin:  no rash or induration seen on limited exam Musculoskeletal:  grossly normal tone BUE/BLE, good ROM, no bony abnormality Psychiatric:  grossly normal mood and affect, speech fluent and appropriate, AOx3 Neurologic:  CN 2-12 grossly intact, moves all extremities in coordinated fashion   Radiological Exams on Admission: Independently reviewed - see discussion in A/P where  applicable  CT ABDOMEN PELVIS WO CONTRAST  Result Date: 09/29/2021 CLINICAL DATA:  71 year old female with history of nausea, vomiting and diarrhea. Possible bowel obstruction. EXAM: CT ABDOMEN AND PELVIS WITHOUT CONTRAST  TECHNIQUE: Multidetector CT imaging of the abdomen and pelvis was performed following the standard protocol without IV contrast. RADIATION DOSE REDUCTION: This exam was performed according to the departmental dose-optimization program which includes automated exposure control, adjustment of the mA and/or kV according to patient size and/or use of iterative reconstruction technique. COMPARISON:  CT the abdomen and pelvis 10/14/2020. FINDINGS: Lower chest: Atherosclerotic calcifications in the left anterior descending and right coronary arteries. Hepatobiliary: No definite suspicious cystic or solid hepatic lesions are confidently identified on today's noncontrast CT examination. Unenhanced appearance of the gallbladder is normal. Pancreas: No definite pancreatic mass or peripancreatic fluid collections or inflammatory changes are noted on today's noncontrast CT examination. Spleen: Unremarkable. Adrenals/Urinary Tract: Tiny calcifications in the left renal hilum appear to be vascular. Unenhanced appearance of the kidneys and bilateral adrenal glands are normal. No hydroureteronephrosis. Urinary bladder is unremarkable in appearance. Stomach/Bowel: Unenhanced appearance of the stomach is normal. No pathologic dilatation of small bowel or colon. A few scattered colonic diverticulae are noted, without surrounding inflammatory changes to suggest an acute diverticulitis at this time. Normal appendix. Vascular/Lymphatic: Aortic atherosclerosis. No lymphadenopathy noted in the abdomen or pelvis. Reproductive: Status post hysterectomy. Ovaries are not confidently identified may be surgically absent or atrophic. Other: No significant volume of ascites.  No pneumoperitoneum. Musculoskeletal: There are no  aggressive appearing lytic or blastic lesions noted in the visualized portions of the skeleton. IMPRESSION: 1. No acute findings are noted in the abdomen or pelvis to account for the patient's symptoms. Specifically, no evidence of bowel obstruction. 2. Mild colonic diverticulosis without evidence of acute diverticulitis at this time. 3. Aortic atherosclerosis, in addition to at least 2 vessel coronary artery disease. Assessment for potential risk factor modification, dietary therapy or pharmacologic therapy may be warranted, if clinically indicated. Electronically Signed   By: Vinnie Langton M.D.   On: 09/29/2021 07:55    EKG: not done   Labs on Admission: I have personally reviewed the available labs and imaging studies at the time of the admission.  Pertinent labs:    Glucose 141 BUN 35/Creatinine 2.09/GFR 25; 27/1.18/49 on 4/22 Lactate 1.4 Normal CBC Normal UA   Assessment and Plan: Principal Problem:   AKI (acute kidney injury) ([place]) Active Problems:   Type 2 diabetes mellitus with hyperglycemia (HCC)   Essential hypertension   CAD in native artery   Seizures (HCC)   Chronic diastolic CHF (congestive heart failure) (HCC)   Chronic kidney disease, stage 3a (HCC)   Severe persistent asthma without complication   Mild cognitive impairment   Diarrhea    AKI on stage 3a CKD -Patient with mild baseline compromised renal function  -Current creatinine is increased at least 1.5 times compared to baseline and presumed to have occurred within the last 7 days -Likely due to prerenal secondary to dehydration from diarrhea - although interestingly she does not have ketonuria or other urinary evidence of dehydration -IVF -Follow up renal function by BMP -Avoid ACEI and NSAIDs  -Will observe overnight on med surg  Diarrhea -Acute onset of diarrhea following fast food meal, likely food poisoning -Diarrhea appears to have resolved -C diff and GI pathogen panel have been ordered in  case of recurrence -Negative CT -Will continue to follow -Reported dark stools noted, but she is on iron and they are not different from reported baseline; also with normal CBC  Asthma -She reports that the majority of her admissions are for asthma -Continue home meds - budesonide, Albuterol, formoterol ([place] per  formulary), Claritin, Singulair  Dementia -Appears to be doing well - able to provide full history without difficulty -Continue Namenda, Xanax -Will add delirium precautions   Seizure d/o -Continue Depakote  CAD/chronic diastolic CHF -Will monitor while providing hydration to ensure no evidence of volume overload -No c/o CP or SOB -Most recent echo appears to have been in 2019 with grade 2 diastolic dysfunction -Continue Lipitor -Atherosclerotic disease noted on CT  HTN -Continue diltiazem, hydralazine, Bystolic  DM -Last R8X was 8.2, indicating suboptimal control -hold glipizide, metformin -Cover with moderate-scale SSI   Glaucoma -Continue latanoprost     Advance Care Planning:   Code Status: Full Code   Consults: TOC team  DVT Prophylaxis: Lovenox  Family Communication: None present; she declined to have me contact family at the time of admission  Severity of Illness: The appropriate patient status for this patient is OBSERVATION. Observation status is judged to be reasonable and necessary in order to provide the required intensity of service to ensure the patient's safety. The patient's presenting symptoms, physical exam findings, and initial radiographic and laboratory data in the context of their medical condition is felt to place them at decreased risk for further clinical deterioration. Furthermore, it is anticipated that the patient will be medically stable for discharge from the hospital within 2 midnights of admission.   Author: Karmen Bongo, MD 09/29/2021 12:23 PM  For on call review [email or web address].

## 2021-09-29 NOTE — ED Provider Notes (Signed)
Pt signed out by Dr. Leonette Monarch.    Pt does have AKI (Last Cr 1.18 on 4/23.  Today, it is 2.09).  Pt said she has had severe diarrhea for the last few days.  She tried to drink some fluids and eat some crackers, but it really hurt her stomach, so she stopped.  She felt like she was going to have more diarrhea and went to the bathroom, but nothing came out but gas.  Stool studies have been ordered, but no stool yet.  I don't think pt can go home and drink enough fluid to reverse her AKI.  So, I spoke with Dr. Lorin Mercy (triad) who will admit for obs.  CT abd/pelvis ordered by Dr. Leonette Monarch.  I reviewed the films and agree with the radiologist.    IMPRESSION:  1. No acute findings are noted in the abdomen or pelvis to account  for the patient's symptoms. Specifically, no evidence of bowel  obstruction.  2. Mild colonic diverticulosis without evidence of acute  diverticulitis at this time.  3. Aortic atherosclerosis, in addition to at least 2 vessel coronary  artery disease. Assessment for potential risk factor modification,  dietary therapy or pharmacologic therapy may be warranted, if  clinically indicated.     Jacqueline Pence, MD 09/29/21 1118

## 2021-09-29 NOTE — ED Triage Notes (Signed)
Patient with diarrhea that started two days ago.  She states that the pain moved into her abdomen yesterday.  She states that it moves across her abdomen.  She has nausea, no vomiting.  She has taken some pepto bismol.  She is having cramping.

## 2021-09-29 NOTE — ED Notes (Signed)
Report given to Ashley, RN

## 2021-09-29 NOTE — ED Provider Notes (Signed)
[facility] Provider Note  CSN: [id number] Arrival date & time: 09/29/21 0431  Chief Complaint(s) Abdominal Pain  HPI [name] is a 71 y.o. female with a past medical history listed below including hypertension, diabetes, prior NSTEMI and diastolic heart failure   Abdominal Pain Pain location:  Generalized Pain quality: cramping, sharp and stabbing   Pain severity:  Moderate Onset quality:  Gradual Duration:  2 days Timing:  Constant Progression:  Waxing and waning Chronicity:  New Context: not diet changes, not recent illness, not sick contacts and not suspicious food intake   Relieved by:  Nothing Worsened by:  Eating Ineffective treatments:  OTC medications Associated symptoms: diarrhea and nausea   Associated symptoms: no fever, no hematochezia and no vomiting    Past Medical History Past Medical History:  Diagnosis Date   Anxiety    Arthritis    Asthma    Chronic diastolic CHF (congestive heart failure) ([place]) 07/17/2016   Coronary artery disease    a. NSTEMI 12/2015 - LHC 01/18/16: s/p overlapping DESx2 to RCA, 10% ost-prox Cx,10% mLAD.   Dementia ([place])    Depression    Diabetes mellitus    Dyslipidemia    GERD (gastroesophageal reflux disease)    History of seizure    Hypertension    Hypertensive heart disease    NSTEMI (non-ST elevated myocardial infarction) ([place]) 12/2015   Stage 3 chronic kidney disease ([place])    Patient Active Problem List   Diagnosis Date Noted   COPD with acute exacerbation ([place]) 08/20/2021   Mild cognitive impairment 08/14/2021   Insomnia 08/01/2021   Localized edema 08/01/2021   Major depression, single episode 08/01/2021   Menopausal symptom 08/01/2021   Polyneuropathy due to type 2 diabetes mellitus ([place]) 08/01/2021   Vitamin D deficiency 08/01/2021   Obesity (BMI 30-39.9) 06/22/2021   Leukocytosis 06/22/2021   Hyponatremia 06/22/2021   Anemia of chronic disease 06/22/2021   Anxiety  06/19/2021   Biphasic stridor    Respiratory distress    Hypomagnesemia 05/13/2021   Dental caries 04/20/2021   Abnormal swallowing 03/29/2021   Acute non-ST segment elevation myocardial infarction ([place]) 03/29/2021   Chronic diarrhea 03/29/2021   Daytime somnolence 03/29/2021   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   Abnormal mammogram of left breast 12/13/2020   Memory loss 03/20/2019   Severe persistent asthma without complication 53/29/9242   DM2 (diabetes mellitus, type 2) ([place]) 03/15/2018   Chronic kidney disease, stage 3a ([place]) 03/15/2018   Dyspnea 08/12/2017   Macrocytic anemia 08/12/2017   Therapeutic drug monitoring 05/17/2017   Lung nodule 08/01/2016   Chronic diastolic CHF (congestive heart failure) ([place]) 07/17/2016   Acute renal failure superimposed on stage 3b chronic kidney disease ([place]) 07/17/2016   Right leg swelling 02/27/2016   Uncontrolled type 2 diabetes mellitus with complication    Seizures ([place]) 01/27/2016   Numbness 01/27/2016   CAD in native artery 01/20/2016   Hypertensive heart disease 01/20/2016   NSTEMI (non-ST elevated myocardial infarction) ([place]) 01/18/2016   Dyspnea on exertion    Upper airway cough syndrome with VCD 08/28/2014   Diabetes mellitus type 2, controlled ([place]) 08/06/2014   COPD exacerbation ([place]) 03/09/2012   Allergic rhinitis 07/22/2008   Vocal cord dysfunction 07/22/2008   DYSPNEA 07/22/2008   Type 2 diabetes mellitus with hyperglycemia ([place]) 07/21/2008   DIABETIC PERIPHERAL NEUROPATHY 07/21/2008   Depression with anxiety 07/21/2008   Essential hypertension 07/21/2008   Mild persistent chronic asthma without complication  07/21/2008   GERD (gastroesophageal reflux disease) 07/21/2008   PULMONARY EMBOLISM, HX OF 07/21/2008   Home Medication(s) Prior to Admission medications   Medication Sig Start Date End Date Taking? Authorizing Provider  acetaminophen-codeine (TYLENOL #3) 300-30 MG tablet Take 1 tablet by  mouth every 6 (six) hours as needed for moderate pain or severe pain. 07/23/21   [provider]  albuterol (PROAIR HFA) 108 (90 Base) MCG/ACT inhaler Inhale 2 puffs into the lungs every 4 (four) hours as needed for wheezing or shortness of breath. 04/20/21   Parrett, Fonnie Mu, NP  albuterol (PROVENTIL) (2.5 MG/3ML) 0.083% nebulizer solution Take 3 mLs (2.5 mg total) by nebulization every 4 (four) hours as needed for wheezing or shortness of breath. 05/17/21   Tanda Rockers, MD  ALPRAZolam Duanne Moron) 0.5 MG tablet Take 1 tablet (0.5 mg total) by mouth 2 (two) times daily as needed for anxiety. Patient taking differently: Take 0.5 mg by mouth See admin instructions. Take 0.5 mg by mouth at bedtime and an additional 0.5 mg once a day as needed for anxiety 07/31/15   Theodis Blaze, MD  atorvastatin (LIPITOR) 40 MG tablet Take 40 mg by mouth at bedtime.    [provider]  Biotin 5000 MCG CAPS Take 5,000 mcg by mouth daily.    [provider]  budesonide (PULMICORT) 0.5 MG/2ML nebulizer solution USE 1 VIAL IN NEBULIZER IN THE MORNING AND AT BEDTIME Patient taking differently: Take 0.5 mg by nebulization in the morning and at bedtime. 03/29/21   Tanda Rockers, MD  diltiazem (CARDIZEM CD) 240 MG 24 hr capsule TAKE 1 CAPSULE BY MOUTH DAILY Patient taking differently: Take 240 mg by mouth daily. 07/27/21   Jerline Pain, MD  divalproex (DEPAKOTE ER) 500 MG 24 hr tablet TAKE 1 TABLET BY MOUTH AT  BEDTIME Patient taking differently: Take 500 mg by mouth at bedtime. 08/08/21   Marcial Pacas, MD  ferrous sulfate 325 (65 FE) MG tablet Take 325 mg by mouth daily with breakfast. 07/15/21   [provider]  fluticasone (FLONASE) 50 MCG/ACT nasal spray Place 2 sprays into both nostrils daily as needed for allergies.    [provider]  formoterol (PERFOROMIST) 20 MCG/2ML nebulizer solution USE 1 VIAL  IN  NEBULIZER TWICE  DAILY - morning and evening Patient taking differently:  Take 20 mcg by nebulization in the morning and at bedtime. 03/29/21   Tanda Rockers, MD  glipiZIDE (GLUCOTROL) 5 MG tablet Take 2 tablets (10 mg total) by mouth daily before breakfast for 5 days, THEN 1 tablet (5 mg total) daily before breakfast. 08/24/21 11/22/21  Mercy Riding, MD  hydrALAZINE (APRESOLINE) 100 MG tablet TAKE 1 TABLET BY MOUTH 3 TIMES  DAILY Patient taking differently: Take 100 mg by mouth 3 (three) times daily. 07/27/21   Jerline Pain, MD  Inulin (FIBER CHOICE PO) Take 1 capsule by mouth daily.    [provider]  ketoconazole (NIZORAL) 2 % cream Apply to both feet and between toes once daily for 6 weeks for Athlete's feet. Patient taking differently: Apply 1 application. topically See admin instructions. Apply to both feet and between toes once daily for 6 weeks for Athlete's feet. 08/01/21   [place]alaway, Jennifer L, DPM  KLOR-CON M20 20 MEQ tablet Take 20 mEq by mouth every other day. 07/17/21   [provider]  latanoprost (XALATAN) 0.005 % ophthalmic solution Place 1 drop into both eyes at bedtime. 02/05/20   [provider]  loratadine (CLARITIN) 10 MG tablet Take 10 mg by mouth daily.    [provider]  magnesium oxide (MAG-OX) 400 (240 Mg) MG tablet Take 1 tablet (400 mg total) by mouth daily. 05/14/21   Lavina Hamman, MD  memantine (NAMENDA) 10 MG tablet TAKE 1 TABLET BY MOUTH TWICE  DAILY 09/06/21   Suzzanne Cloud, NP  metFORMIN (GLUCOPHAGE) 1000 MG tablet Take 1,000 mg by mouth 2 (two) times daily with a meal.    [provider]  montelukast (SINGULAIR) 10 MG tablet Take 1 tablet (10 mg total) by mouth at bedtime. 05/25/21   Tanda Rockers, MD  Multiple Vitamins-Minerals (ONE-A-DAY WOMENS PO) Take 1 tablet by mouth daily with breakfast.    [provider]  nebivolol (BYSTOLIC) 10 MG tablet Take 1 tablet (10 mg total) by mouth daily. 04/10/19   Jerline Pain, MD  nitroGLYCERIN (NITROSTAT) 0.4 MG SL tablet Place 1 tablet (0.4  mg total) under the tongue every 5 (five) minutes as needed for chest pain. Don't exceed more than 3 doses in 15 mins 06/24/21   Aline August, MD  pantoprazole (PROTONIX) 40 MG tablet Take 1 tablet (40 mg total) by mouth 2 (two) times daily before a meal. 06/24/21   Aline August, MD  Probiotic Product (EQ PROBIOTIC) CAPS Take 1 capsule by mouth every morning.    [provider]                                                                                                                                    Allergies Tramadol and Chlorthalidone  Review of Systems Review of Systems  Constitutional:  Negative for fever.  Gastrointestinal:  Positive for abdominal pain, diarrhea and nausea. Negative for hematochezia and vomiting.  As noted in HPI  Physical Exam Vital Signs  I have reviewed the triage vital signs BP (!) 147/91   Pulse 86   Temp 98 F (36.7 C) (Oral)   Resp 17   LMP  (LMP Unknown)   SpO2 99%   Physical Exam Vitals reviewed.  Constitutional:      General: She is not in acute distress.    Appearance: She is well-developed. She is not diaphoretic.  HENT:     Head: Normocephalic and atraumatic.     Right Ear: External ear normal.     Left Ear: External ear normal.     Nose: Nose normal.  Eyes:     General: No scleral icterus.    Conjunctiva/sclera: Conjunctivae normal.  Neck:     Trachea: Phonation normal.  Cardiovascular:     Rate and Rhythm: Normal rate and regular rhythm.  Pulmonary:     Effort: Pulmonary effort is normal. No respiratory distress.     Breath sounds: No stridor.  Abdominal:     General: There is no distension.     Tenderness: There is abdominal tenderness in  the right lower quadrant, periumbilical area, suprapubic area and left lower quadrant. There is guarding. There is no rebound.  Musculoskeletal:        General: Normal range of motion.     Cervical back: Normal range of motion.  Neurological:     Mental Status: She is alert  and oriented to person, place, and time.  Psychiatric:        Behavior: Behavior normal.    ED Results and Treatments Labs (all labs ordered are listed, but only abnormal results are displayed) Labs Reviewed  COMPREHENSIVE METABOLIC PANEL - Abnormal; Notable for the following components:      Result Value   Glucose, Bld 141 (*)    BUN 35 (*)    Creatinine, Ser 2.09 (*)    GFR, Estimated 25 (*)    All other components within normal limits  LIPASE, BLOOD  CBC WITH DIFFERENTIAL/PLATELET  LACTIC ACID, PLASMA  URINALYSIS, ROUTINE W REFLEX MICROSCOPIC  LACTIC ACID, PLASMA                                                                                                                         EKG  EKG Interpretation  Date/Time:    Ventricular Rate:    PR Interval:    QRS Duration:   QT Interval:    QTC Calculation:   R Axis:     Text Interpretation:         Radiology No results found.  Pertinent labs & imaging results that were available during my care of the patient were reviewed by me and considered in my medical decision making (see MDM for details).  Medications Ordered in ED Medications  HYDROmorphone (DILAUDID) injection 0.5 mg (0.5 mg Intravenous Given 09/29/21 0624)  ondansetron (ZOFRAN) injection 4 mg (4 mg Intravenous Given 09/29/21 0622)  hyoscyamine (LEVSIN SL) SL tablet 0.125 mg (0.125 mg Oral Given 09/29/21 0627)  sodium chloride 0.9 % bolus 1,000 mL (1,000 mLs Intravenous New Bag/Given 09/29/21 1610)                                                                                                                                     Procedures Procedures  (including critical care time)  Medical Decision Making / ED Course    Complexity of Problem:  Co-morbidities/SDOH that complicate the patient evaluation/care: Noted above in HPI  Patient's presenting problem/concern, DDX, and MDM  listed below: Abdominal pain We will assess for intra-abdominal  inflammatory/infectious process or bowel obstruction, UTI   Hospitalization Considered:  yes  Initial Intervention:  Antiemetics, IV pain medicine, Levsin and IV fluids    Complexity of Data:   Laboratory Tests ordered listed below with my independent interpretation: CBC without leukocytosis or anemia No significant electrolyte derangement Patient does have evidence of AKI No biliary obstruction or pancreatitis UA pending   Imaging Studies ordered listed below with my independent interpretation: CT abd/pelvis pending     ED Course:    Assessment, Add'l Intervention, and Reassessment: Abd pain UA and CT pending Pain improved with treatment Patient care turned over to oncoming provider. Patient case and results discussed in detail; please see their note for further ED managment.   Final Clinical Impression(s) / ED Diagnoses Final diagnoses:  None           This chart was dictated using voice recognition software.  Despite best efforts to proofread,  errors can occur which can change the documentation meaning.    Fatima Blank, MD 09/29/21 [id number]

## 2021-09-29 NOTE — ED Notes (Signed)
ED TO INPATIENT HANDOFF REPORT  ED Nurse Name and Phone #: [phone] [place] Name/Age/Gender [name] 71 y.o. female Room/Bed: [phone]  Code Status   Code Status: Full Code  Home/SNF/Other Home Patient oriented to: self, place, time, and situation Is this baseline? Yes   Triage Complete: Triage complete  Chief Complaint AKI (acute kidney injury) [name]) [N17.9]  Triage Note Patient with diarrhea that started two days ago.  She states that the pain moved into her abdomen yesterday.  She states that it moves across her abdomen.  She has nausea, no vomiting.  She has taken some pepto bismol.  She is having cramping.     Allergies Allergies  Allergen Reactions   Tramadol Other (See Comments)    Not to be taken because of existing seizure disorder/SEIZURES    Chlorthalidone Other (See Comments)    Was stopped due to kidney function    Level of Care/Admitting Diagnosis ED Disposition     ED Disposition  Admit   Condition  --   [place]: [place] [[id number]]  Level of Care: Med-Surg [16]  May place patient in observation at [facility] or [place] if equivalent level of care is available:: Yes  Covid Evaluation: Asymptomatic - no recent exposure (last 10 days) testing not required  Diagnosis: AKI (acute kidney injury) [facility]) [[id number]]  Admitting Physician: Karmen Bongo [[id number]]  Attending Physician: Karmen Bongo [[id number]]          B Medical/Surgery History Past Medical History:  Diagnosis Date   Anxiety    Arthritis    Asthma    Chronic diastolic CHF (congestive heart failure) ([place]) 07/17/2016   Coronary artery disease    a. NSTEMI 12/2015 - LHC 01/18/16: s/p overlapping DESx2 to RCA, 10% ost-prox Cx,10% mLAD.   Dementia ([place])    Depression    Diabetes mellitus    Dyslipidemia    GERD (gastroesophageal reflux disease)    History of seizure    Hypertension    Hypertensive heart disease    NSTEMI (non-ST  elevated myocardial infarction) ([place]) 12/2015   Stage 3 chronic kidney disease (HCC)    Past Surgical History:  Procedure Laterality Date   ABDOMINAL HYSTERECTOMY     CARDIAC CATHETERIZATION  1994   minimal LAD dz, no other dz, EF normal   CARDIAC CATHETERIZATION N/A 01/18/2016   Procedure: Left Heart Cath and Coronary Angiography;  Surgeon: Burnell Blanks, MD;  Location: [place] [facility];  Service: Cardiovascular;  Laterality: N/A;   CARDIAC CATHETERIZATION N/A 01/18/2016   Procedure: Coronary Stent Intervention;  Surgeon: Burnell Blanks, MD;  Location: [place] [facility];  Service: Cardiovascular;  Laterality: N/A;   COLONOSCOPY WITH PROPOFOL N/A 04/09/2014   Procedure: COLONOSCOPY WITH PROPOFOL;  Surgeon: Cleotis Nipper, MD;  Location: [facility];  Service: Endoscopy;  Laterality: N/A;   CORONARY STENT PLACEMENT  01/19/2016   STENT SYNERGY DES [id number] drug eluting stent was successfully placed, and overlaps the 2.5 x 38 mm Synergy stent placed distally.   ESOPHAGOGASTRODUODENOSCOPY (EGD) WITH PROPOFOL N/A 04/09/2014   Procedure: ESOPHAGOGASTRODUODENOSCOPY (EGD) WITH PROPOFOL;  Surgeon: Cleotis Nipper, MD;  Location: [facility];  Service: Endoscopy;  Laterality: N/A;   FOOT SURGERY Bilateral    approx ~2000   HEMORRHOID SURGERY     HERNIA REPAIR     KNEE ARTHROSCOPY     RADIOACTIVE SEED GUIDED EXCISIONAL BREAST BIOPSY Left 01/20/2021   Procedure: RADIOACTIVE SEED  GUIDED EXCISIONAL LEFT BREAST BIOPSY;  Surgeon: Stark Klein, MD;  Location: [place];  Service: General;  Laterality: Left;     A IV Location/Drains/Wounds Patient Lines/Drains/Airways Status     Active Line/Drains/Airways     Name Placement date Placement time Site Days   Peripheral IV 09/29/21 22 G Left Hand 09/29/21  0621  Hand  less than 1            Intake/Output Last 24 hours  Intake/Output Summary (Last 24 hours) at 09/29/2021 1405 Last data filed at 09/29/2021 0853 Gross per 24 hour   Intake 1000 ml  Output --  Net 1000 ml    Labs/Imaging Results for orders placed or performed during the hospital encounter of 09/29/21 (from the past 48 hour(s))  Comprehensive metabolic panel     Status: Abnormal   Collection Time: 09/29/21  4:48 AM  Result Value Ref Range   Sodium 141 135 - 145 mmol/L   Potassium 4.0 3.5 - 5.1 mmol/L   Chloride 106 98 - 111 mmol/L   CO2 23 22 - 32 mmol/L   Glucose, Bld 141 (H) 70 - 99 mg/dL    Comment: Glucose reference range applies only to samples taken after fasting for at least 8 hours.   BUN 35 (H) 8 - 23 mg/dL   Creatinine, Ser 2.09 (H) 0.44 - 1.00 mg/dL   Calcium 9.6 8.9 - 10.3 mg/dL   Total Protein 7.1 6.5 - 8.1 g/dL   Albumin 4.2 3.5 - 5.0 g/dL   AST 17 15 - 41 U/L   ALT 16 0 - 44 U/L   Alkaline Phosphatase 55 38 - 126 U/L   Total Bilirubin 0.6 0.3 - 1.2 mg/dL   GFR, Estimated 25 (L) >60 mL/min    Comment: (NOTE) Calculated using the CKD-EPI Creatinine Equation (2021)    Anion gap 12 5 - 15    Comment: Performed at [facility] [address], [place], [place] [address]  Lipase, blood     Status: None   Collection Time: 09/29/21  4:48 AM  Result Value Ref Range   Lipase 24 11 - 51 U/L    Comment: Performed at [name] [address], [place], [place] [address]  CBC with Diff     Status: None   Collection Time: 09/29/21  4:48 AM  Result Value Ref Range   WBC 9.0 4.0 - 10.5 K/uL   RBC 3.95 3.87 - 5.11 MIL/uL   Hemoglobin 12.5 12.0 - 15.0 g/dL   HCT 38.6 36.0 - 46.0 %   MCV 97.7 80.0 - 100.0 fL   MCH 31.6 26.0 - 34.0 pg   MCHC 32.4 30.0 - 36.0 g/dL   RDW 13.4 11.5 - 15.5 %   Platelets 345 150 - 400 K/uL   nRBC 0.0 0.0 - 0.2 %   Neutrophils Relative % 58 %   Neutro Abs 5.1 1.7 - 7.7 K/uL   Lymphocytes Relative 32 %   Lymphs Abs 2.9 0.7 - 4.0 K/uL   Monocytes Relative 9 %   Monocytes Absolute 0.8 0.1 - 1.0 K/uL   Eosinophils Relative 0 %   Eosinophils Absolute 0.0 0.0 - 0.5 K/uL   Basophils Relative  1 %   Basophils Absolute 0.1 0.0 - 0.1 K/uL   Immature Granulocytes 0 %   Abs Immature Granulocytes 0.04 0.00 - 0.07 K/uL    Comment: Performed at [facility], [address], [place], [place] [address]  Lactic acid, plasma     Status: None   Collection Time: 09/29/21  4:48 AM  Result Value Ref Range   Lactic Acid, Venous 1.4 0.5 - 1.9 mmol/L    Comment: Performed at [facility] [address], [place], [place] [address]  Urinalysis, Routine w reflex microscopic     Status: Abnormal   Collection Time: 09/29/21  8:42 AM  Result Value Ref Range   Color, Urine YELLOW YELLOW   APPearance HAZY (A) CLEAR   Specific Gravity, Urine 1.015 1.005 - 1.030   pH 5.0 5.0 - 8.0   Glucose, UA NEGATIVE NEGATIVE mg/dL   Hgb urine dipstick NEGATIVE NEGATIVE   Bilirubin Urine NEGATIVE NEGATIVE   Ketones, ur NEGATIVE NEGATIVE mg/dL   Protein, ur NEGATIVE NEGATIVE mg/dL   Nitrite NEGATIVE NEGATIVE   Leukocytes,Ua NEGATIVE NEGATIVE    Comment: Performed at [facility] [address] [place], [place] [address]   CT ABDOMEN PELVIS WO CONTRAST  Result Date: 09/29/2021 CLINICAL DATA:  71 year old female with history of nausea, vomiting and diarrhea. Possible bowel obstruction. EXAM: CT ABDOMEN AND PELVIS WITHOUT CONTRAST TECHNIQUE: Multidetector CT imaging of the abdomen and pelvis was performed following the standard protocol without IV contrast. RADIATION DOSE REDUCTION: This exam was performed according to the departmental dose-optimization program which includes automated exposure control, adjustment of the mA and/or kV according to patient size and/or use of iterative reconstruction technique. COMPARISON:  CT the abdomen and pelvis 10/14/2020. FINDINGS: Lower chest: Atherosclerotic calcifications in the left anterior descending and right coronary arteries. Hepatobiliary: No definite suspicious cystic or solid hepatic lesions are confidently identified on today's noncontrast CT  examination. Unenhanced appearance of the gallbladder is normal. Pancreas: No definite pancreatic mass or peripancreatic fluid collections or inflammatory changes are noted on today's noncontrast CT examination. Spleen: Unremarkable. Adrenals/Urinary Tract: Tiny calcifications in the left renal hilum appear to be vascular. Unenhanced appearance of the kidneys and bilateral adrenal glands are normal. No hydroureteronephrosis. Urinary bladder is unremarkable in appearance. Stomach/Bowel: Unenhanced appearance of the stomach is normal. No pathologic dilatation of small bowel or colon. A few scattered colonic diverticulae are noted, without surrounding inflammatory changes to suggest an acute diverticulitis at this time. Normal appendix. Vascular/Lymphatic: Aortic atherosclerosis. No lymphadenopathy noted in the abdomen or pelvis. Reproductive: Status post hysterectomy. Ovaries are not confidently identified may be surgically absent or atrophic. Other: No significant volume of ascites.  No pneumoperitoneum. Musculoskeletal: There are no aggressive appearing lytic or blastic lesions noted in the visualized portions of the skeleton. IMPRESSION: 1. No acute findings are noted in the abdomen or pelvis to account for the patient's symptoms. Specifically, no evidence of bowel obstruction. 2. Mild colonic diverticulosis without evidence of acute diverticulitis at this time. 3. Aortic atherosclerosis, in addition to at least 2 vessel coronary artery disease. Assessment for potential risk factor modification, dietary therapy or pharmacologic therapy may be warranted, if clinically indicated. Electronically Signed   By: Vinnie Langton M.D.   On: 09/29/2021 07:55    Pending Labs Unresulted Labs (From admission, onward)     Start     Ordered   09/30/21 1660  Basic metabolic panel  Tomorrow morning,   R        09/29/21 1208   09/30/21 0500  CBC  Tomorrow morning,   R        09/29/21 1208   09/29/21 0929  Gastrointestinal  Panel by PCR , Stool  (Gastrointestinal Panel by PCR, Stool                                                                                                                                                     **  Does Not include CLOSTRIDIUM DIFFICILE testing. **If CDIFF testing is needed, place order from the "C Difficile Testing" order set.**)  Once,   URGENT        09/29/21 0928   09/29/21 0928  C Difficile Quick Screen w PCR reflex  (C Difficile quick screen w PCR reflex panel )  Once, for 24 hours,   URGENT       References:    CDiff Information Tool   09/29/21 0928            Vitals/Pain Today's Vitals   09/29/21 0830 09/29/21 0844 09/29/21 1000 09/29/21 1100  BP: (!) 146/69  (!) 152/64   Pulse: 76  84   Resp: 14  (!) 23   Temp:      TempSrc:      SpO2: 98%  96%   PainSc:  6   10-Worst pain ever    Isolation Precautions Enteric precautions (UV disinfection)  Medications Medications  lactated ringers infusion ( Intravenous New Bag/Given 09/29/21 1159)  atorvastatin (LIPITOR) tablet 40 mg (has no administration in time range)  diltiazem (CARDIZEM CD) 24 hr capsule 240 mg (has no administration in time range)  hydrALAZINE (APRESOLINE) tablet 100 mg (has no administration in time range)  nebivolol (BYSTOLIC) tablet 10 mg (has no administration in time range)  ALPRAZolam (XANAX) tablet 0.5 mg (has no administration in time range)  memantine (NAMENDA) tablet 10 mg (has no administration in time range)  pantoprazole (PROTONIX) EC tablet 40 mg (has no administration in time range)  EQ Probiotic CAPS 1 capsule (has no administration in time range)  divalproex (DEPAKOTE ER) 24 hr tablet 500 mg (has no administration in time range)  albuterol (PROVENTIL) (2.5 MG/3ML) 0.083% nebulizer solution 2.5 mg (has no administration in time range)  budesonide (PULMICORT) nebulizer solution 0.5 mg (0.5 mg Nebulization Not Given 09/29/21 1301)  arformoterol (BROVANA) nebulizer solution 15 mcg (15 mcg  Nebulization Not Given 09/29/21 1301)  loratadine (CLARITIN) tablet 10 mg (has no administration in time range)  montelukast (SINGULAIR) tablet 10 mg (has no administration in time range)  latanoprost (XALATAN) 0.005 % ophthalmic solution 1 drop (has no administration in time range)  insulin aspart (novoLOG) injection 0-15 Units (has no administration in time range)  insulin aspart (novoLOG) injection 0-5 Units (has no administration in time range)  enoxaparin (LOVENOX) injection 40 mg (has no administration in time range)  acetaminophen (TYLENOL) tablet 650 mg (has no administration in time range)    Or  acetaminophen (TYLENOL) suppository 650 mg (has no administration in time range)  morphine (PF) 2 MG/ML injection 2 mg (has no administration in time range)  ondansetron (ZOFRAN) tablet 4 mg (has no administration in time range)    Or  ondansetron (ZOFRAN) injection 4 mg (has no administration in time range)  hydrALAZINE (APRESOLINE) injection 5 mg (has no administration in time range)  HYDROmorphone (DILAUDID) injection 0.5 mg (0.5 mg Intravenous Given 09/29/21 0624)  ondansetron (ZOFRAN) injection 4 mg (4 mg Intravenous Given 09/29/21 0622)  hyoscyamine (LEVSIN SL) SL tablet 0.125 mg (0.125 mg Oral Given 09/29/21 0627)  sodium chloride 0.9 % bolus 1,000 mL (0 mLs Intravenous Stopped 09/29/21 0853)  HYDROmorphone (DILAUDID) injection 0.5 mg (0.5 mg Intravenous Given 09/29/21 1103)    Mobility walks Low fall risk   Focused Assessments Diarrhea and nausea, generalized abd pain.    R Recommendations: See Admitting Provider Note  Report given to:   Additional Notes: A&Ox4, ambulates to bathroom

## 2021-09-30 DIAGNOSIS — E860 Dehydration: Secondary | ICD-10-CM | POA: Diagnosis present

## 2021-09-30 DIAGNOSIS — I252 Old myocardial infarction: Secondary | ICD-10-CM | POA: Diagnosis not present

## 2021-09-30 DIAGNOSIS — H409 Unspecified glaucoma: Secondary | ICD-10-CM | POA: Diagnosis present

## 2021-09-30 DIAGNOSIS — Z6832 Body mass index (BMI) 32.0-32.9, adult: Secondary | ICD-10-CM | POA: Diagnosis not present

## 2021-09-30 DIAGNOSIS — F32A Depression, unspecified: Secondary | ICD-10-CM | POA: Diagnosis present

## 2021-09-30 DIAGNOSIS — G40909 Epilepsy, unspecified, not intractable, without status epilepticus: Secondary | ICD-10-CM | POA: Diagnosis present

## 2021-09-30 DIAGNOSIS — Z87891 Personal history of nicotine dependence: Secondary | ICD-10-CM | POA: Diagnosis not present

## 2021-09-30 DIAGNOSIS — N179 Acute kidney failure, unspecified: Secondary | ICD-10-CM | POA: Diagnosis present

## 2021-09-30 DIAGNOSIS — A059 Bacterial foodborne intoxication, unspecified: Secondary | ICD-10-CM | POA: Diagnosis present

## 2021-09-30 DIAGNOSIS — N1831 Chronic kidney disease, stage 3a: Secondary | ICD-10-CM | POA: Diagnosis present

## 2021-09-30 DIAGNOSIS — J4550 Severe persistent asthma, uncomplicated: Secondary | ICD-10-CM | POA: Diagnosis present

## 2021-09-30 DIAGNOSIS — E1165 Type 2 diabetes mellitus with hyperglycemia: Secondary | ICD-10-CM | POA: Diagnosis present

## 2021-09-30 DIAGNOSIS — I130 Hypertensive heart and chronic kidney disease with heart failure and stage 1 through stage 4 chronic kidney disease, or unspecified chronic kidney disease: Secondary | ICD-10-CM | POA: Diagnosis present

## 2021-09-30 DIAGNOSIS — Z833 Family history of diabetes mellitus: Secondary | ICD-10-CM | POA: Diagnosis not present

## 2021-09-30 DIAGNOSIS — J383 Other diseases of vocal cords: Secondary | ICD-10-CM | POA: Diagnosis present

## 2021-09-30 DIAGNOSIS — R197 Diarrhea, unspecified: Secondary | ICD-10-CM | POA: Diagnosis present

## 2021-09-30 DIAGNOSIS — Z825 Family history of asthma and other chronic lower respiratory diseases: Secondary | ICD-10-CM | POA: Diagnosis not present

## 2021-09-30 DIAGNOSIS — I2510 Atherosclerotic heart disease of native coronary artery without angina pectoris: Secondary | ICD-10-CM | POA: Diagnosis present

## 2021-09-30 DIAGNOSIS — E1122 Type 2 diabetes mellitus with diabetic chronic kidney disease: Secondary | ICD-10-CM | POA: Diagnosis present

## 2021-09-30 DIAGNOSIS — F0390 Unspecified dementia without behavioral disturbance: Secondary | ICD-10-CM | POA: Diagnosis present

## 2021-09-30 DIAGNOSIS — E669 Obesity, unspecified: Secondary | ICD-10-CM | POA: Diagnosis present

## 2021-09-30 DIAGNOSIS — E785 Hyperlipidemia, unspecified: Secondary | ICD-10-CM | POA: Diagnosis present

## 2021-09-30 DIAGNOSIS — I5032 Chronic diastolic (congestive) heart failure: Secondary | ICD-10-CM | POA: Diagnosis present

## 2021-09-30 DIAGNOSIS — R109 Unspecified abdominal pain: Secondary | ICD-10-CM | POA: Diagnosis present

## 2021-09-30 DIAGNOSIS — Z90710 Acquired absence of both cervix and uterus: Secondary | ICD-10-CM | POA: Diagnosis not present

## 2021-09-30 DIAGNOSIS — Z8249 Family history of ischemic heart disease and other diseases of the circulatory system: Secondary | ICD-10-CM | POA: Diagnosis not present

## 2021-09-30 LAB — CBC
HCT: 33.9 % — ABNORMAL LOW (ref 36.0–46.0)
Hemoglobin: 11.1 g/dL — ABNORMAL LOW (ref 12.0–15.0)
MCH: 32.1 pg (ref 26.0–34.0)
MCHC: 32.7 g/dL (ref 30.0–36.0)
MCV: 98 fL (ref 80.0–100.0)
Platelets: 315 10*3/uL (ref 150–400)
RBC: 3.46 MIL/uL — ABNORMAL LOW (ref 3.87–5.11)
RDW: 13.3 % (ref 11.5–15.5)
WBC: 7.8 10*3/uL (ref 4.0–10.5)
nRBC: 0 % (ref 0.0–0.2)

## 2021-09-30 LAB — BASIC METABOLIC PANEL
Anion gap: 6 (ref 5–15)
BUN: 23 mg/dL (ref 8–23)
CO2: 27 mmol/L (ref 22–32)
Calcium: 9.1 mg/dL (ref 8.9–10.3)
Chloride: 107 mmol/L (ref 98–111)
Creatinine, Ser: 1.53 mg/dL — ABNORMAL HIGH (ref 0.44–1.00)
GFR, Estimated: 36 mL/min — ABNORMAL LOW (ref >60)
Glucose, Bld: 129 mg/dL — ABNORMAL HIGH (ref 70–99)
Potassium: 4.1 mmol/L (ref 3.5–5.1)
Sodium: 140 mmol/L (ref 135–145)

## 2021-09-30 LAB — GLUCOSE, CAPILLARY
Glucose-Capillary: 159 mg/dL — ABNORMAL HIGH (ref 70–99)
Glucose-Capillary: 200 mg/dL — ABNORMAL HIGH (ref 70–99)
Glucose-Capillary: 185 mg/dL — ABNORMAL HIGH (ref 70–99)
Glucose-Capillary: 116 mg/dL — ABNORMAL HIGH (ref 70–99)

## 2021-09-30 MED ORDER — ALUM & MAG HYDROXIDE-SIMETH 200-200-20 MG/5ML PO SUSP
30.0000 mL | Freq: Four times a day (QID) | ORAL | Status: DC | PRN
Start: 2021-09-30 — End: 2021-10-02
  Administered 2021-09-30 (×2): 30 mL via ORAL
  Filled 2021-09-30 (×2): qty 30

## 2021-09-30 MED ORDER — DICYCLOMINE HCL 20 MG PO TABS
20.0000 mg | ORAL_TABLET | Freq: Three times a day (TID) | ORAL | Status: DC
  Administered 2021-09-30 – 2021-10-02 (×7): 20 mg via ORAL
  Filled 2021-09-30 (×7): qty 1

## 2021-09-30 MED ORDER — HYDROMORPHONE HCL 1 MG/ML IJ SOLN
0.5000 mg | Freq: Once | INTRAMUSCULAR | Status: AC
  Administered 2021-09-30: 0.5 mg via INTRAVENOUS
  Filled 2021-09-30: qty 1

## 2021-09-30 MED ORDER — IPRATROPIUM-ALBUTEROL 0.5-2.5 (3) MG/3ML IN SOLN
3.0000 mL | Freq: Four times a day (QID) | RESPIRATORY_TRACT | Status: DC
  Administered 2021-09-30 (×3): 3 mL via RESPIRATORY_TRACT
  Filled 2021-09-30 (×2): qty 3

## 2021-09-30 MED ORDER — LABETALOL HCL 5 MG/ML IV SOLN
10.0000 mg | INTRAVENOUS | Status: DC | PRN
  Filled 2021-09-30: qty 4

## 2021-09-30 NOTE — Progress Notes (Signed)
PROGRESS NOTE  [name]  UYQ:[id number] DOB: 19-Sep-1950 DOA: 09/29/2021 PCP: [name], MD   Brief Narrative: Patient is a 71 year old female with history of chronic diastolic CHF, coronary artery disease, asthma, dementia, hypertension, stage IIIa CKD, vocal cord dysfunction who presented with complaint of diarrhea, abdominal pain/cramping from home.  The symptoms started after she ate steak biscuit from [place].  On presentation, she was complaining of excruciating pain in the midepigastric region that radiated to her back.  CT abdomen/pelvis was unremarkable.  Lab work showed mild AKI likely from dehydration.  Creatinine was in the range of 2.  She was admitted for further management.  Started on IV fluids.   Assessment & Plan:  Principal Problem:   AKI (acute kidney injury) ([place]) Active Problems:   Type 2 diabetes mellitus with hyperglycemia (HCC)   Essential hypertension   CAD in native artery   Seizures (HCC)   Chronic diastolic CHF (congestive heart failure) (HCC)   Chronic kidney disease, stage 3a (HCC)   Severe persistent asthma without complication   Mild cognitive impairment   Diarrhea   Abdominal pain   AKI on CKD stage IIIa: Baseline creatinine around 1.2-1.5.  Presented with creatinine in the range of 2.  Started on IV fluids with improvement.  Currently kidney.  Close to baseline.  Diarrhea/abdominal pain: Started after eating steak biscuit from [place].  Currently she does not have any diarrhea but complains of abdominal cramp.  GI pathogen panel/C. difficile ordered but no bowel movement here.  Abdominal/pelvic CT scan did not show any acute intra-abdominal abnormalities.  Continue supportive care.  Bentyl ordered.  Wheezing/asthma/vocal cord dysfunction: Found to be wheezing today.  Resume home budesonide.  Continue scheduled DuoNeb.  Also on Claritin, Singulair.  Chronic diastolic congestive heart failure: Almost euvolemic.  Has very little pitting  edema on bilateral lower extremities.  Echocardiogram done on 07/2017 showed grade 2 diastolic dysfunction, EF 65 to 70%  History of coronary artery disease: No anginal symptoms.  On Lipitor.  Hypertension: Takes diltiazem, hydralazine, Bystolic.  This medications have been continued.  Diabetes type 2: Last A1c of 8.2.  Takes metformin, glipizide at home.  Continue sliding-scale insulin here  Glaucoma: Continue latanoprost  History of seizure: Continue Depakote  History of dementia: Currently alert and oriented.  On Namenda, Xanax.  Delirium precautions  Obesity: BMI 32.2            DVT prophylaxis:enoxaparin (LOVENOX) injection 30 mg Start: 09/29/21 2200     Code Status: Full Code  Family Communication: None at bedside  Patient status:Inpatient  Patient is from :Home  Anticipated discharge GL:OVFI  Estimated DC date:1-2 days   Consultants: None  Procedures:None  Antimicrobials:  Anti-infectives (From admission, onward)    None       Subjective: Patient seen and examined at the bedside this morning.  Hemodynamically stable during my evaluation.  I heard some wheezing when I auscultated her.  She still complains of abdominal discomfort with cramps.  No nausea, vomiting or diarrhea.  Objective: Vitals:   09/30/21 0502 09/30/21 0812 09/30/21 0953 09/30/21 1045  BP: (!) 163/71  (!) 182/78   Pulse: 77  78   Resp: 18  19   Temp: 98.7 F (37.1 C)  98.9 F (37.2 C)   TempSrc: Oral  Oral   SpO2: 98% 98% 99% 99%  Weight:      Height:        Intake/Output Summary (Last 24 hours) at 09/30/2021 1118 Last  data filed at 09/30/2021 0600 Gross per 24 hour  Intake 2084.04 ml  Output --  Net 2084.04 ml   Filed Weights   09/29/21 1000 09/29/21 1510  Weight: 80 kg 80 kg    Examination:  General exam: Overall comfortable, not in distress,obese HEENT: PERRL Respiratory system: Wheezing, no crackles Cardiovascular system: S1 & S2 heard, RRR.  Gastrointestinal  system: Abdomen is nondistended, soft and nontender. Central nervous system: Alert and oriented Extremities: No edema, no clubbing ,no cyanosis Skin: No rashes, no ulcers,no icterus     Data Reviewed: I have personally reviewed following labs and imaging studies  CBC: Recent Labs  Lab 09/29/21 0448 09/30/21 0242  WBC 9.0 7.8  NEUTROABS 5.1  --   HGB 12.5 11.1*  HCT 38.6 33.9*  MCV 97.7 98.0  PLT 345 962   Basic Metabolic Panel: Recent Labs  Lab 09/29/21 0448 09/30/21 0242  NA 141 140  K 4.0 4.1  CL 106 107  CO2 23 27  GLUCOSE 141* 129*  BUN 35* 23  CREATININE 2.09* 1.53*  CALCIUM 9.6 9.1     No results found for this or any previous visit (from the past 240 hour(s)).   Radiology Studies: CT ABDOMEN PELVIS WO CONTRAST  Result Date: 09/29/2021 CLINICAL DATA:  71 year old female with history of nausea, vomiting and diarrhea. Possible bowel obstruction. EXAM: CT ABDOMEN AND PELVIS WITHOUT CONTRAST TECHNIQUE: Multidetector CT imaging of the abdomen and pelvis was performed following the standard protocol without IV contrast. RADIATION DOSE REDUCTION: This exam was performed according to the departmental dose-optimization program which includes automated exposure control, adjustment of the mA and/or kV according to patient size and/or use of iterative reconstruction technique. COMPARISON:  CT the abdomen and pelvis 10/14/2020. FINDINGS: Lower chest: Atherosclerotic calcifications in the left anterior descending and right coronary arteries. Hepatobiliary: No definite suspicious cystic or solid hepatic lesions are confidently identified on today's noncontrast CT examination. Unenhanced appearance of the gallbladder is normal. Pancreas: No definite pancreatic mass or peripancreatic fluid collections or inflammatory changes are noted on today's noncontrast CT examination. Spleen: Unremarkable. Adrenals/Urinary Tract: Tiny calcifications in the left renal hilum appear to be vascular.  Unenhanced appearance of the kidneys and bilateral adrenal glands are normal. No hydroureteronephrosis. Urinary bladder is unremarkable in appearance. Stomach/Bowel: Unenhanced appearance of the stomach is normal. No pathologic dilatation of small bowel or colon. A few scattered colonic diverticulae are noted, without surrounding inflammatory changes to suggest an acute diverticulitis at this time. Normal appendix. Vascular/Lymphatic: Aortic atherosclerosis. No lymphadenopathy noted in the abdomen or pelvis. Reproductive: Status post hysterectomy. Ovaries are not confidently identified may be surgically absent or atrophic. Other: No significant volume of ascites.  No pneumoperitoneum. Musculoskeletal: There are no aggressive appearing lytic or blastic lesions noted in the visualized portions of the skeleton. IMPRESSION: 1. No acute findings are noted in the abdomen or pelvis to account for the patient's symptoms. Specifically, no evidence of bowel obstruction. 2. Mild colonic diverticulosis without evidence of acute diverticulitis at this time. 3. Aortic atherosclerosis, in addition to at least 2 vessel coronary artery disease. Assessment for potential risk factor modification, dietary therapy or pharmacologic therapy may be warranted, if clinically indicated. Electronically Signed   By: Vinnie Langton M.D.   On: 09/29/2021 07:55    Scheduled Meds:  atorvastatin  40 mg Oral QHS   budesonide  0.5 mg Nebulization Daily   dicyclomine  20 mg Oral TID AC   diltiazem  240 mg Oral Daily  divalproex  500 mg Oral QHS   enoxaparin (LOVENOX) injection  30 mg Subcutaneous Q24H   hydrALAZINE  100 mg Oral TID   insulin aspart  0-15 Units Subcutaneous TID WC   insulin aspart  0-5 Units Subcutaneous QHS   ipratropium-albuterol  3 mL Nebulization Q6H   latanoprost  1 drop Both Eyes QHS   loratadine  10 mg Oral Daily   memantine  10 mg Oral BID   montelukast  10 mg Oral QHS   nebivolol  10 mg Oral Daily    pantoprazole  40 mg Oral BID AC   Continuous Infusions:  lactated ringers 75 mL/hr (09/30/21 0911)     LOS: 0 days   Shelly Coss, MD [facility] P6/06/2021, 11:18 AM

## 2021-10-01 ENCOUNTER — Inpatient Hospital Stay (HOSPITAL_COMMUNITY): Payer: Medicare Other

## 2021-10-01 LAB — BASIC METABOLIC PANEL
Anion gap: 6 (ref 5–15)
BUN: 11 mg/dL (ref 8–23)
CO2: 27 mmol/L (ref 22–32)
Calcium: 9.1 mg/dL (ref 8.9–10.3)
Chloride: 107 mmol/L (ref 98–111)
Creatinine, Ser: 1.31 mg/dL — ABNORMAL HIGH (ref 0.44–1.00)
GFR, Estimated: 43 mL/min — ABNORMAL LOW (ref >60)
Glucose, Bld: 254 mg/dL — ABNORMAL HIGH (ref 70–99)
Potassium: 4.1 mmol/L (ref 3.5–5.1)
Sodium: 140 mmol/L (ref 135–145)

## 2021-10-01 LAB — GLUCOSE, CAPILLARY
Glucose-Capillary: 118 mg/dL — ABNORMAL HIGH (ref 70–99)
Glucose-Capillary: 124 mg/dL — ABNORMAL HIGH (ref 70–99)
Glucose-Capillary: 197 mg/dL — ABNORMAL HIGH (ref 70–99)
Glucose-Capillary: 149 mg/dL — ABNORMAL HIGH (ref 70–99)

## 2021-10-01 MED ORDER — ENOXAPARIN SODIUM 40 MG/0.4ML IJ SOSY
40.0000 mg | PREFILLED_SYRINGE | INTRAMUSCULAR | Status: DC
  Administered 2021-10-01: 40 mg via SUBCUTANEOUS
  Filled 2021-10-01: qty 0.4

## 2021-10-01 MED ORDER — HYDROMORPHONE HCL 1 MG/ML IJ SOLN
0.5000 mg | INTRAMUSCULAR | Status: DC | PRN
  Administered 2021-10-01 – 2021-10-02 (×4): 0.5 mg via INTRAVENOUS
  Filled 2021-10-01 (×4): qty 1

## 2021-10-01 MED ORDER — POLYETHYLENE GLYCOL 3350 17 G PO PACK
17.0000 g | PACK | Freq: Every day | ORAL | Status: DC
  Administered 2021-10-01 – 2021-10-02 (×2): 17 g via ORAL
  Filled 2021-10-01 (×2): qty 1

## 2021-10-01 MED ORDER — IPRATROPIUM-ALBUTEROL 0.5-2.5 (3) MG/3ML IN SOLN: 3.0000 mL | Freq: Four times a day (QID) | RESPIRATORY_TRACT | Status: DC | PRN

## 2021-10-01 MED ORDER — METHOCARBAMOL 1000 MG/10ML IJ SOLN
500.0000 mg | Freq: Four times a day (QID) | INTRAVENOUS | Status: DC | PRN
  Administered 2021-10-01: 500 mg via INTRAVENOUS
  Filled 2021-10-01: qty 500

## 2021-10-01 MED ORDER — BISACODYL 10 MG RE SUPP
10.0000 mg | Freq: Once | RECTAL | Status: DC
  Filled 2021-10-01: qty 1

## 2021-10-01 MED ORDER — SENNA 8.6 MG PO TABS
1.0000 | ORAL_TABLET | Freq: Two times a day (BID) | ORAL | Status: DC
  Administered 2021-10-01 – 2021-10-02 (×3): 8.6 mg via ORAL
  Filled 2021-10-01 (×3): qty 1

## 2021-10-01 NOTE — Plan of Care (Signed)
  Problem: Education: Goal: Knowledge of General Education information will improve Description Including pain rating scale, medication(s)/side effects and non-pharmacologic comfort measures Outcome: Progressing   

## 2021-10-01 NOTE — Progress Notes (Signed)
PROGRESS NOTE  [name]  MEQ:[id number] DOB: 1950/06/01 DOA: 09/29/2021 PCP: [name], MD   Brief Narrative: Patient is a 71 year old female with history of chronic diastolic CHF, coronary artery disease, asthma, dementia, hypertension, stage IIIa CKD, vocal cord dysfunction who presented with complaint of diarrhea, abdominal pain/cramping from home.  The symptoms started after she ate steak biscuit from [place].  On presentation, she was complaining of excruciating pain in the midepigastric region that radiated to her back.  CT abdomen/pelvis was unremarkable.  Lab work showed mild AKI likely from dehydration.  Creatinine was in the range of 2.  She was admitted for further management.  Started on IV fluids.  Hospital course remarkable for persistent lower abdominal cramps.   Assessment & Plan:  Principal Problem:   AKI (acute kidney injury) ([place]) Active Problems:   Type 2 diabetes mellitus with hyperglycemia (HCC)   Essential hypertension   CAD in native artery   Seizures (HCC)   Chronic diastolic CHF (congestive heart failure) (HCC)   Chronic kidney disease, stage 3a (HCC)   Severe persistent asthma without complication   Mild cognitive impairment   Diarrhea   Abdominal pain   AKI on CKD stage IIIa: Baseline creatinine around 1.2-1.5.  Presented with creatinine in the range of 2.  Started on IV fluids with improvement.  Currently kidney function close to baseline.  Diarrhea/abdominal pain: Started after eating steak biscuit from [place].  Currently she does not have any diarrhea but complains of abdominal cramp.  GI pathogen panel/C. difficile ordered but no bowel movement here.  Abdominal/pelvic CT scan did not show any acute intra-abdominal abnormalities.  Continue supportive care.  Bentyl ordered.  Continues to complain of pain today.  Abdominal x-ray did not show any features of bowel obstruction or ileus.  Added Robaxin, Dilaudid.  Added soft diet.  Also ordered  bowel regimen in order to facilitate a bowel movement because she does not have a bowel movement for last 2 to 3 days  Wheezing/asthma/vocal cord dysfunction: Found to be wheezing today.  Resume home budesonide.  Continue scheduled DuoNeb.  Also on Claritin, Singulair.  Chronic diastolic congestive heart failure: Almost euvolemic.  Has very little pitting edema on bilateral lower extremities.  Echocardiogram done on 07/2017 showed grade 2 diastolic dysfunction, EF 65 to 70%  History of coronary artery disease: No anginal symptoms.  On Lipitor.  Hypertension: Takes diltiazem, hydralazine, Bystolic.  This medications have been continued.  Diabetes type 2: Last A1c of 8.2.  Takes metformin, glipizide at home.  Continue sliding-scale insulin here  Glaucoma: Continue latanoprost  History of seizure: Continue Depakote  History of dementia: Currently alert and oriented.  On Namenda, Xanax.  Delirium precautions  Obesity: BMI 32.2            DVT prophylaxis:enoxaparin (LOVENOX) injection 40 mg Start: 10/01/21 2200     Code Status: Full Code  Family Communication: None at bedside  Patient status:Inpatient  Patient is from :Home  Anticipated discharge [facility]  Estimated DC date: Likely tomorrow.  Waiting for resolution of abdominal pain   Consultants: None  Procedures:None  Antimicrobials:  Anti-infectives (From admission, onward)    None       Subjective: Patient seen and examined the bedside this morning.  Hemodynamically stable.  Sitting in the chair today.  No diarrhea.  Continues to complain of lower abdominal discomfort with cramps.  Passing gas. Objective: Vitals:   09/30/21 2047 09/30/21 2052 10/01/21 0557 10/01/21 0813  BP:  Marland Kitchen)  151/87 (!) 152/61 (!) 146/65  Pulse:  65 83 80  Resp:  '18 18 16  '$ Temp:  98.2 F (36.8 C) 98.6 F (37 C) 99.2 F (37.3 C)  TempSrc:  Oral Oral Oral  SpO2: 100% 100% 100% 99%  Weight:      Height:        Intake/Output  Summary (Last 24 hours) at 10/01/2021 1405 Last data filed at 10/01/2021 0900 Gross per 24 hour  Intake 2449.61 ml  Output --  Net 2449.61 ml   Filed Weights   09/29/21 1000 09/29/21 1510  Weight: 80 kg 80 kg    Examination:  General exam: Overall comfortable, not in distress,obese HEENT: PERRL Respiratory system: Mild bilateral wheezing Cardiovascular system: S1 & S2 heard, RRR.  Gastrointestinal system: Abdomen is nondistended, soft and mildly tender on lower area  Central nervous system: Alert and oriented Extremities: No edema, no clubbing ,no cyanosis Skin: No rashes, no ulcers,no icterus     Data Reviewed: I have personally reviewed following labs and imaging studies  CBC: Recent Labs  Lab 09/29/21 0448 09/30/21 0242  WBC 9.0 7.8  NEUTROABS 5.1  --   HGB 12.5 11.1*  HCT 38.6 33.9*  MCV 97.7 98.0  PLT 345 947   Basic Metabolic Panel: Recent Labs  Lab 09/29/21 0448 09/30/21 0242 10/01/21 0227  NA 141 140 140  K 4.0 4.1 4.1  CL 106 107 107  CO2 '23 27 27  '$ GLUCOSE 141* 129* 254*  BUN 35* 23 11  CREATININE 2.09* 1.53* 1.31*  CALCIUM 9.6 9.1 9.1     No results found for this or any previous visit (from the past 240 hour(s)).   Radiology Studies: DG Abd Portable 1V  Result Date: 10/01/2021 CLINICAL DATA:  Abdominal pain EXAM: PORTABLE ABDOMEN - 1 VIEW COMPARISON:  09/29/2021 CT and FINDINGS: Nondistended gas-filled colon and small bowel are noted. No dilated bowel loops are noted. No suspicious calcifications are noted. No acute bony abnormalities are identified. IMPRESSION: Unremarkable exam. Electronically Signed   By: Margarette Canada M.D.   On: 10/01/2021 11:19    Scheduled Meds:  atorvastatin  40 mg Oral QHS   bisacodyl  10 mg Rectal Once   budesonide  0.5 mg Nebulization Daily   dicyclomine  20 mg Oral TID AC   diltiazem  240 mg Oral Daily   divalproex  500 mg Oral QHS   enoxaparin (LOVENOX) injection  40 mg Subcutaneous Q24H   hydrALAZINE  100 mg Oral  TID   insulin aspart  0-15 Units Subcutaneous TID WC   insulin aspart  0-5 Units Subcutaneous QHS   latanoprost  1 drop Both Eyes QHS   loratadine  10 mg Oral Daily   memantine  10 mg Oral BID   montelukast  10 mg Oral QHS   nebivolol  10 mg Oral Daily   pantoprazole  40 mg Oral BID AC   polyethylene glycol  17 g Oral Daily   senna  1 tablet Oral BID   Continuous Infusions:  methocarbamol (ROBAXIN) IV       LOS: 1 day   Shelly Coss, MD [facility] P6/06/2021, 2:05 PM

## 2021-10-02 ENCOUNTER — Inpatient Hospital Stay (HOSPITAL_COMMUNITY): Payer: Medicare Other

## 2021-10-02 LAB — GLUCOSE, CAPILLARY
Glucose-Capillary: 121 mg/dL — ABNORMAL HIGH (ref 70–99)
Glucose-Capillary: 104 mg/dL — ABNORMAL HIGH (ref 70–99)

## 2021-10-02 MED ORDER — DICYCLOMINE HCL 20 MG PO TABS: 20.0000 mg | ORAL_TABLET | Freq: Three times a day (TID) | ORAL | 0 refills | Status: DC | PRN

## 2021-10-02 MED ORDER — IOHEXOL 9 MG/ML PO SOLN
ORAL | Status: AC
  Administered 2021-10-02: 500 mL
  Filled 2021-10-02: qty 1000

## 2021-10-02 MED ORDER — POLYETHYLENE GLYCOL 3350 17 G PO PACK: 17.0000 g | PACK | Freq: Every day | ORAL | 0 refills | Status: DC | PRN

## 2021-10-02 NOTE — Progress Notes (Signed)
DISCHARGE NOTE HOME [name] to be discharged Home per MD order. Discussed prescriptions and follow up appointments with the patient. Prescriptions given to patient; medication list explained in detail. Patient verbalized understanding.  Skin clean, dry and intact without evidence of skin break down, no evidence of skin tears noted. IV catheter discontinued intact. Site without signs and symptoms of complications. Dressing and pressure applied. Pt denies pain at the site currently. No complaints noted.  Patient free of lines, drains, and wounds.   An After Visit Summary (AVS) was printed and given to the patient. Patient escorted via wheelchair, and discharged home via private auto.  Arlyss Repress, RN

## 2021-10-02 NOTE — Discharge Summary (Signed)
Physician Discharge Summary  LETOYA STALLONE XBM:[id number] DOB: 08-11-1950 DOA: 09/29/2021  PCP: [name], MD  Admit date: 09/29/2021 Discharge date: 10/02/2021  Admitted From: Home Disposition:  Home  Discharge Condition:Stable CODE STATUS:FULL Diet recommendation: Carb Modified    Brief/Interim Summary:  Patient is a 71 year old female with history of chronic diastolic CHF, coronary artery disease, asthma, dementia, hypertension, stage IIIa CKD, vocal cord dysfunction who presented with complaint of diarrhea, abdominal pain/cramping from home.  The symptoms started after she ate steak biscuit from [place].  On presentation, she was complaining of excruciating pain in the midepigastric region that radiated to her back.  CT abdomen/pelvis was unremarkable.  Lab work showed mild AKI likely from dehydration.  Creatinine was in the range of 2.  She was admitted for further management.  Started on IV fluids.  Hospital course remarkable for persistent lower abdominal cramps.  Follow-up Abdominal CT, abdominal x-ray did not show any intra-abdominal pathology.  Patient's abdominal discomfort has improved today.  Medically stable for discharge.  Following problems were addressed during her hospitalization:  AKI on CKD stage IIIa: Baseline creatinine around 1.2-1.5.  Presented with creatinine in the range of 2.  Started on IV fluids with improvement.  Currently kidney function close to baseline.  She follows with [facility]   Diarrhea/abdominal pain: Started after eating steak biscuit from [place].  Currently she does not have any diarrhea but complains of abdominal cramp.  GI pathogen panel/C. difficile ordered but no bowel movement here.  Abdominal/pelvic CT scan did not show any acute intra-abdominal abnormalities  Abdominal x-ray did not show any features of bowel obstruction or ileus.    Also ordered bowel regimen in order to facilitate a bowel movement because she does not have a bowel  movement for last 2 to 3 days.  Follow-up abdominal CT did not show any acute intra-abdominal findings.   Wheezing/asthma/vocal cord dysfunction: Found to be wheezing today.  Resume home budesonide and other inhalers.  Also on Claritin, Singulair.   Chronic diastolic congestive heart failure: Almost euvolemic.  Has very little pitting edema on bilateral lower extremities.  Echocardiogram done on 07/2017 showed grade 2 diastolic dysfunction, EF 65 to 70%   History of coronary artery disease: No anginal symptoms.  On Lipitor.   Hypertension: Takes diltiazem, hydralazine, Bystolic.  This medications have been continued.   Diabetes type 2: Last A1c of 8.2.  Takes metformin, glipizide at home.    Glaucoma: Continue latanoprost   History of seizure: Continue Depakote   History of dementia: Currently alert and oriented.  On Namenda, Xanax.  Delirium precautions   Obesity: BMI 32.2         Discharge Diagnoses:  Principal Problem:   AKI (acute kidney injury) ([facility]) Active Problems:   Type 2 diabetes mellitus with hyperglycemia (HCC)   Essential hypertension   CAD in native artery   Seizures (HCC)   Chronic diastolic CHF (congestive heart failure) (HCC)   Chronic kidney disease, stage 3a (HCC)   Severe persistent asthma without complication   Mild cognitive impairment   Diarrhea   Abdominal pain    Discharge Instructions  Discharge Instructions     Diet Carb Modified   Complete by: As directed    Discharge instructions   Complete by: As directed    1)Please take prescribed medications as instructed 2)Follow up with your PCP in a week   Increase activity slowly   Complete by: As directed       Allergies as  of 10/02/2021       Reactions   Tramadol Other (See Comments)   Not to be taken because of existing seizure disorder/SEIZURES   Chlorthalidone Other (See Comments)   Was stopped due to kidney function        Medication List     TAKE these medications     acetaminophen-codeine 300-30 MG tablet Commonly known as: TYLENOL #3 Take 1 tablet by mouth every 6 (six) hours as needed for moderate pain or severe pain.   albuterol 108 (90 Base) MCG/ACT inhaler Commonly known as: ProAir HFA Inhale 2 puffs into the lungs every 4 (four) hours as needed for wheezing or shortness of breath.   albuterol (2.5 MG/3ML) 0.083% nebulizer solution Commonly known as: PROVENTIL Take 3 mLs (2.5 mg total) by nebulization every 4 (four) hours as needed for wheezing or shortness of breath.   ALPRAZolam 0.5 MG tablet Commonly known as: XANAX Take 1 tablet (0.5 mg total) by mouth 2 (two) times daily as needed for anxiety. What changed:  when to take this additional instructions   atorvastatin 40 MG tablet Commonly known as: LIPITOR Take 40 mg by mouth at bedtime.   Biotin 5000 MCG Caps Take 5,000 mcg by mouth daily.   budesonide 0.5 MG/2ML nebulizer solution Commonly known as: PULMICORT USE 1 VIAL IN NEBULIZER IN THE MORNING AND AT BEDTIME What changed: See the new instructions.   dicyclomine 20 MG tablet Commonly known as: BENTYL Take 1 tablet (20 mg total) by mouth 3 (three) times daily as needed for spasms.   diltiazem 240 MG 24 hr capsule Commonly known as: CARDIZEM CD TAKE 1 CAPSULE BY MOUTH DAILY   divalproex 500 MG 24 hr tablet Commonly known as: DEPAKOTE ER TAKE 1 TABLET BY MOUTH AT  BEDTIME   EQ Probiotic Caps Take 1 capsule by mouth every morning.   ferrous sulfate 325 (65 FE) MG tablet Take 325 mg by mouth daily with breakfast.   FIBER CHOICE PO Take 1 capsule by mouth daily.   fluticasone 50 MCG/ACT nasal spray Commonly known as: FLONASE Place 2 sprays into both nostrils daily as needed for allergies.   formoterol 20 MCG/2ML nebulizer solution Commonly known as: Perforomist USE 1 VIAL  IN  NEBULIZER TWICE  DAILY - morning and evening What changed:  how much to take how to take this when to take this additional  instructions   glipiZIDE 5 MG tablet Commonly known as: GLUCOTROL Take 2 tablets (10 mg total) by mouth daily before breakfast for 5 days, THEN 1 tablet (5 mg total) daily before breakfast. Start taking on: August 24, 2021   hydrALAZINE 100 MG tablet Commonly known as: APRESOLINE TAKE 1 TABLET BY MOUTH 3 TIMES  DAILY   Klor-Con M20 20 MEQ tablet Generic drug: potassium chloride SA Take 20 mEq by mouth every other day.   latanoprost 0.005 % ophthalmic solution Commonly known as: XALATAN Place 1 drop into both eyes at bedtime.   loratadine 10 MG tablet Commonly known as: CLARITIN Take 10 mg by mouth daily.   magnesium oxide 400 (240 Mg) MG tablet Commonly known as: MAG-OX Take 1 tablet (400 mg total) by mouth daily.   memantine 10 MG tablet Commonly known as: NAMENDA TAKE 1 TABLET BY MOUTH TWICE  DAILY   metFORMIN 1000 MG tablet Commonly known as: GLUCOPHAGE Take 1,000 mg by mouth 2 (two) times daily with a meal.   montelukast 10 MG tablet Commonly known as: SINGULAIR Take 1 tablet (  10 mg total) by mouth at bedtime.   nebivolol 10 MG tablet Commonly known as: BYSTOLIC Take 1 tablet (10 mg total) by mouth daily.   nitroGLYCERIN 0.4 MG SL tablet Commonly known as: NITROSTAT Place 1 tablet (0.4 mg total) under the tongue every 5 (five) minutes as needed for chest pain. Don't exceed more than 3 doses in 15 mins   ONE-A-DAY WOMENS PO Take 1 tablet by mouth daily with breakfast.   pantoprazole 40 MG tablet Commonly known as: PROTONIX Take 1 tablet (40 mg total) by mouth 2 (two) times daily before a meal.   polyethylene glycol 17 g packet Commonly known as: MIRALAX / GLYCOLAX Take 17 g by mouth daily as needed.        Follow-up Information     [name], MD. Schedule an appointment as soon as possible for a visit in 1 week(s).   Specialty: Family Medicine Contact information: [place] [address] [phone]                 Allergies  Allergen Reactions   Tramadol Other (See Comments)    Not to be taken because of existing seizure disorder/SEIZURES    Chlorthalidone Other (See Comments)    Was stopped due to kidney function    Consultations: None   Procedures/Studies: CT ABDOMEN PELVIS WO CONTRAST  Result Date: 10/02/2021 CLINICAL DATA:  Abdominal pain EXAM: CT ABDOMEN AND PELVIS WITHOUT CONTRAST TECHNIQUE: Multidetector CT imaging of the abdomen and pelvis was performed following the standard protocol without IV contrast. RADIATION DOSE REDUCTION: This exam was performed according to the departmental dose-optimization program which includes automated exposure control, adjustment of the mA and/or kV according to patient size and/or use of iterative reconstruction technique. COMPARISON:  09/29/2021 FINDINGS: Lower chest: There are small linear densities in the lower lung fields. Heart is enlarged in size. Coronary artery calcifications are seen. Hepatobiliary: No significant focal abnormality is seen in the liver. There is small calcification in the porta hepatis. There is no dilation of bile ducts. Gallbladder is not distended. Pancreas: No focal abnormality is seen. Spleen: Unremarkable. Adrenals/Urinary Tract: Adrenals are unremarkable. There is no hydronephrosis. There is tiny submillimeter calcific density in the midportion of left kidney, possibly nonobstructing calculus or vascular calcification. Urinary bladder is unremarkable. Stomach/Bowel: Stomach is not distended. Small bowel loops are not dilated. Appendix is unremarkable. Few diverticula are seen in the colon. There are no signs of focal acute diverticulitis. There is no significant wall thickening in colon. There is no pericolic stranding. Vascular/Lymphatic: Scattered arterial calcifications are seen. Reproductive: Uterus is not seen. Other: There is no ascites or pneumoperitoneum. Small umbilical hernia containing fat is seen. Musculoskeletal:  No acute findings are seen. IMPRESSION: There is no evidence intestinal obstruction or pneumoperitoneum. There is no hydronephrosis. Appendix is unremarkable. Few diverticula are seen in the colon without signs of focal acute diverticulitis. Coronary artery disease. There is submillimeter calcific density in the midportion of left kidney which may suggest tiny nonobstructing calculus or vascular calcification. Electronically Signed   By: Elmer Picker M.D.   On: 10/02/2021 14:17   CT ABDOMEN PELVIS WO CONTRAST  Result Date: 09/29/2021 CLINICAL DATA:  71 year old female with history of nausea, vomiting and diarrhea. Possible bowel obstruction. EXAM: CT ABDOMEN AND PELVIS WITHOUT CONTRAST TECHNIQUE: Multidetector CT imaging of the abdomen and pelvis was performed following the standard protocol without IV contrast. RADIATION DOSE REDUCTION: This exam was performed according to the departmental  dose-optimization program which includes automated exposure control, adjustment of the mA and/or kV according to patient size and/or use of iterative reconstruction technique. COMPARISON:  CT the abdomen and pelvis 10/14/2020. FINDINGS: Lower chest: Atherosclerotic calcifications in the left anterior descending and right coronary arteries. Hepatobiliary: No definite suspicious cystic or solid hepatic lesions are confidently identified on today's noncontrast CT examination. Unenhanced appearance of the gallbladder is normal. Pancreas: No definite pancreatic mass or peripancreatic fluid collections or inflammatory changes are noted on today's noncontrast CT examination. Spleen: Unremarkable. Adrenals/Urinary Tract: Tiny calcifications in the left renal hilum appear to be vascular. Unenhanced appearance of the kidneys and bilateral adrenal glands are normal. No hydroureteronephrosis. Urinary bladder is unremarkable in appearance. Stomach/Bowel: Unenhanced appearance of the stomach is normal. No pathologic dilatation of  small bowel or colon. A few scattered colonic diverticulae are noted, without surrounding inflammatory changes to suggest an acute diverticulitis at this time. Normal appendix. Vascular/Lymphatic: Aortic atherosclerosis. No lymphadenopathy noted in the abdomen or pelvis. Reproductive: Status post hysterectomy. Ovaries are not confidently identified may be surgically absent or atrophic. Other: No significant volume of ascites.  No pneumoperitoneum. Musculoskeletal: There are no aggressive appearing lytic or blastic lesions noted in the visualized portions of the skeleton. IMPRESSION: 1. No acute findings are noted in the abdomen or pelvis to account for the patient's symptoms. Specifically, no evidence of bowel obstruction. 2. Mild colonic diverticulosis without evidence of acute diverticulitis at this time. 3. Aortic atherosclerosis, in addition to at least 2 vessel coronary artery disease. Assessment for potential risk factor modification, dietary therapy or pharmacologic therapy may be warranted, if clinically indicated. Electronically Signed   By: Vinnie Langton M.D.   On: 09/29/2021 07:55   DG Abd Portable 1V  Result Date: 10/01/2021 CLINICAL DATA:  Abdominal pain EXAM: PORTABLE ABDOMEN - 1 VIEW COMPARISON:  09/29/2021 CT and FINDINGS: Nondistended gas-filled colon and small bowel are noted. No dilated bowel loops are noted. No suspicious calcifications are noted. No acute bony abnormalities are identified. IMPRESSION: Unremarkable exam. Electronically Signed   By: Margarette Canada M.D.   On: 10/01/2021 11:19      Subjective: Patient seen and examined at the bedside this morning and afternoon.  Feels better today.  Hemodynamically stable.  I explained about the findings of repeat abdominal CT at the bedside.  We discussed about discharge planning.  I also talked to the daughter-in-law on phone.  She is stable for discharge.  Discharge Exam: Vitals:   10/02/21 0821 10/02/21 0929  BP:  133/70  Pulse: 72  76  Resp: 16 18  Temp:  98.7 F (37.1 C)  SpO2:  99%   Vitals:   10/01/21 2058 10/02/21 0539 10/02/21 0821 10/02/21 0929  BP: 126/60 139/70  133/70  Pulse: 66 66 72 76  Resp: '18 19 16 18  '$ Temp: 98.3 F (36.8 C) 98.8 F (37.1 C)  98.7 F (37.1 C)  TempSrc: Oral Oral  Oral  SpO2: 98% 100%  99%  Weight:      Height:        General: Pt is alert, awake, not in acute distress Cardiovascular: RRR, S1/S2 +, no rubs, no gallops Respiratory: CTA bilaterally, no wheezing, no rhonchi Abdominal: Soft, NT, ND, bowel sounds + Extremities: no edema, no cyanosis    The results of significant diagnostics from this hospitalization (including imaging, microbiology, ancillary and laboratory) are listed below for reference.     Microbiology: No results found for this or any previous visit (from  the past 240 hour(s)).   Labs: BNP (last 3 results) Recent Labs    05/13/21 1000 08/14/21 1431 08/20/21 0149  BNP 15.9 50.7 43.3   Basic Metabolic Panel: Recent Labs  Lab 09/29/21 0448 09/30/21 0242 10/01/21 0227  NA 141 140 140  K 4.0 4.1 4.1  CL 106 107 107  CO2 '23 27 27  '$ GLUCOSE 141* 129* 254*  BUN 35* 23 11  CREATININE 2.09* 1.53* 1.31*  CALCIUM 9.6 9.1 9.1   Liver Function Tests: Recent Labs  Lab 09/29/21 0448  AST 17  ALT 16  ALKPHOS 55  BILITOT 0.6  PROT 7.1  ALBUMIN 4.2   Recent Labs  Lab 09/29/21 0448  LIPASE 24   No results for input(s): AMMONIA in the last 168 hours. CBC: Recent Labs  Lab 09/29/21 0448 09/30/21 0242  WBC 9.0 7.8  NEUTROABS 5.1  --   HGB 12.5 11.1*  HCT 38.6 33.9*  MCV 97.7 98.0  PLT 345 315   Cardiac Enzymes: No results for input(s): CKTOTAL, CKMB, CKMBINDEX, TROPONINI in the last 168 hours. BNP: Invalid input(s): POCBNP CBG: Recent Labs  Lab 10/01/21 1129 10/01/21 1614 10/01/21 2058 10/02/21 0718 10/02/21 1129  GLUCAP 124* 197* 149* 121* 104*   D-Dimer No results for input(s): DDIMER in the last 72 hours. Hgb A1c No  results for input(s): HGBA1C in the last 72 hours. Lipid Profile No results for input(s): CHOL, HDL, LDLCALC, TRIG, CHOLHDL, LDLDIRECT in the last 72 hours. Thyroid function studies No results for input(s): TSH, T4TOTAL, T3FREE, THYROIDAB in the last 72 hours.  Invalid input(s): FREET3 Anemia work up No results for input(s): VITAMINB12, FOLATE, FERRITIN, TIBC, IRON, RETICCTPCT in the last 72 hours. Urinalysis    Component Value Date/Time   COLORURINE YELLOW 09/29/2021 0842   APPEARANCEUR HAZY (A) 09/29/2021 0842   LABSPEC 1.015 09/29/2021 0842   PHURINE 5.0 09/29/2021 0842   GLUCOSEU NEGATIVE 09/29/2021 0842   HGBUR NEGATIVE 09/29/2021 0842   BILIRUBINUR NEGATIVE 09/29/2021 0842   KETONESUR NEGATIVE 09/29/2021 0842   PROTEINUR NEGATIVE 09/29/2021 0842   UROBILINOGEN 0.2 08/07/2014 1304   NITRITE NEGATIVE 09/29/2021 0842   LEUKOCYTESUR NEGATIVE 09/29/2021 0842   Sepsis Labs Invalid input(s): PROCALCITONIN,  WBC,  LACTICIDVEN Microbiology No results found for this or any previous visit (from the past 240 hour(s)).  Please note: You were cared for by a hospitalist during your hospital stay. Once you are discharged, your primary care physician will handle any further medical issues. Please note that NO REFILLS for any discharge medications will be authorized once you are discharged, as it is imperative that you return to your primary care physician (or establish a relationship with a primary care physician if you do not have one) for your post hospital discharge needs so that they can reassess your need for medications and monitor your lab values.    Time coordinating discharge: 40 minutes  SIGNED:   Shelly Coss, MD  [facility] 10/02/2021, 2:45 PM Pager [phone]  If 7PM-7AM, please contact night-coverage [email or web address] Password [email or web address]

## 2021-10-02 NOTE — Plan of Care (Signed)
  Problem: Clinical Measurements: Goal: Will remain free from infection Outcome: Progressing   Problem: Clinical Measurements: Goal: Diagnostic test results will improve Outcome: Progressing   Problem: Nutrition: Goal: Adequate nutrition will be maintained Outcome: Progressing   Problem: Coping: Goal: Level of anxiety will decrease Outcome: Progressing   

## 2021-10-04 ENCOUNTER — Emergency Department (HOSPITAL_COMMUNITY): Payer: Medicare Other

## 2021-10-04 ENCOUNTER — Other Ambulatory Visit: Payer: Self-pay

## 2021-10-04 ENCOUNTER — Encounter (HOSPITAL_COMMUNITY): Payer: Self-pay

## 2021-10-04 ENCOUNTER — Emergency Department (HOSPITAL_COMMUNITY)
Admission: EM | Admit: 2021-10-04 | Discharge: 2021-10-04 | Disposition: A | Discharge status: Home / Self Care | Payer: Medicare Other | Attending: Student | Admitting: Student

## 2021-10-04 DIAGNOSIS — R109 Unspecified abdominal pain: Secondary | ICD-10-CM | POA: Diagnosis present

## 2021-10-04 DIAGNOSIS — I130 Hypertensive heart and chronic kidney disease with heart failure and stage 1 through stage 4 chronic kidney disease, or unspecified chronic kidney disease: Secondary | ICD-10-CM | POA: Diagnosis not present

## 2021-10-04 DIAGNOSIS — Z79899 Other long term (current) drug therapy: Secondary | ICD-10-CM | POA: Diagnosis not present

## 2021-10-04 DIAGNOSIS — F0390 Unspecified dementia without behavioral disturbance: Secondary | ICD-10-CM | POA: Diagnosis not present

## 2021-10-04 DIAGNOSIS — R197 Diarrhea, unspecified: Secondary | ICD-10-CM | POA: Insufficient documentation

## 2021-10-04 DIAGNOSIS — R110 Nausea: Secondary | ICD-10-CM | POA: Diagnosis not present

## 2021-10-04 DIAGNOSIS — N1831 Chronic kidney disease, stage 3a: Secondary | ICD-10-CM | POA: Insufficient documentation

## 2021-10-04 DIAGNOSIS — R059 Cough, unspecified: Secondary | ICD-10-CM | POA: Diagnosis not present

## 2021-10-04 DIAGNOSIS — Z20822 Contact with and (suspected) exposure to covid-19: Secondary | ICD-10-CM | POA: Diagnosis not present

## 2021-10-04 DIAGNOSIS — I2510 Atherosclerotic heart disease of native coronary artery without angina pectoris: Secondary | ICD-10-CM | POA: Insufficient documentation

## 2021-10-04 DIAGNOSIS — I5030 Unspecified diastolic (congestive) heart failure: Secondary | ICD-10-CM | POA: Insufficient documentation

## 2021-10-04 DIAGNOSIS — R079 Chest pain, unspecified: Secondary | ICD-10-CM | POA: Diagnosis not present

## 2021-10-04 DIAGNOSIS — J45909 Unspecified asthma, uncomplicated: Secondary | ICD-10-CM | POA: Insufficient documentation

## 2021-10-04 DIAGNOSIS — Z7951 Long term (current) use of inhaled steroids: Secondary | ICD-10-CM | POA: Diagnosis not present

## 2021-10-04 DIAGNOSIS — R1084 Generalized abdominal pain: Secondary | ICD-10-CM | POA: Insufficient documentation

## 2021-10-04 DIAGNOSIS — Z794 Long term (current) use of insulin: Secondary | ICD-10-CM | POA: Diagnosis not present

## 2021-10-04 LAB — CBC WITH DIFFERENTIAL/PLATELET
Abs Immature Granulocytes: 0.03 10*3/uL (ref 0.00–0.07)
Basophils Absolute: 0.1 10*3/uL (ref 0.0–0.1)
Basophils Relative: 1 %
Eosinophils Absolute: 0 10*3/uL (ref 0.0–0.5)
Eosinophils Relative: 1 %
HCT: 36.8 % (ref 36.0–46.0)
Hemoglobin: 12.2 g/dL (ref 12.0–15.0)
Immature Granulocytes: 0 %
Lymphocytes Relative: 24 %
Lymphs Abs: 2 10*3/uL (ref 0.7–4.0)
MCH: 31.9 pg (ref 26.0–34.0)
MCHC: 33.2 g/dL (ref 30.0–36.0)
MCV: 96.3 fL (ref 80.0–100.0)
Monocytes Absolute: 0.7 10*3/uL (ref 0.1–1.0)
Monocytes Relative: 8 %
Neutro Abs: 5.6 10*3/uL (ref 1.7–7.7)
Neutrophils Relative %: 66 %
Platelets: 333 10*3/uL (ref 150–400)
RBC: 3.82 MIL/uL — ABNORMAL LOW (ref 3.87–5.11)
RDW: 12.9 % (ref 11.5–15.5)
WBC: 8.5 10*3/uL (ref 4.0–10.5)
nRBC: 0 % (ref 0.0–0.2)

## 2021-10-04 LAB — COMPREHENSIVE METABOLIC PANEL
ALT: 17 U/L (ref 0–44)
AST: 18 U/L (ref 15–41)
Albumin: 4.2 g/dL (ref 3.5–5.0)
Alkaline Phosphatase: 62 U/L (ref 38–126)
Anion gap: 10 (ref 5–15)
BUN: 17 mg/dL (ref 8–23)
CO2: 27 mmol/L (ref 22–32)
Calcium: 9.9 mg/dL (ref 8.9–10.3)
Chloride: 103 mmol/L (ref 98–111)
Creatinine, Ser: 1.37 mg/dL — ABNORMAL HIGH (ref 0.44–1.00)
GFR, Estimated: 41 mL/min — ABNORMAL LOW (ref >60)
Glucose, Bld: 107 mg/dL — ABNORMAL HIGH (ref 70–99)
Potassium: 3.9 mmol/L (ref 3.5–5.1)
Sodium: 140 mmol/L (ref 135–145)
Total Bilirubin: 0.6 mg/dL (ref 0.3–1.2)
Total Protein: 7.3 g/dL (ref 6.5–8.1)

## 2021-10-04 LAB — TROPONIN I (HIGH SENSITIVITY)
Troponin I (High Sensitivity): 5 ng/L (ref <18)
Troponin I (High Sensitivity): 5 ng/L (ref <18)

## 2021-10-04 LAB — RESP PANEL BY RT-PCR (FLU A&B, COVID) ARPGX2
Influenza A by PCR: NEGATIVE
Influenza B by PCR: NEGATIVE
SARS Coronavirus 2 by RT PCR: NEGATIVE

## 2021-10-04 LAB — C DIFFICILE QUICK SCREEN W PCR REFLEX
C Diff antigen: NEGATIVE
C Diff interpretation: NOT DETECTED
C Diff toxin: NEGATIVE

## 2021-10-04 LAB — LIPASE, BLOOD: Lipase: 24 U/L (ref 11–51)

## 2021-10-04 MED ORDER — ONDANSETRON HCL 4 MG/2ML IJ SOLN
4.0000 mg | Freq: Once | INTRAMUSCULAR | Status: AC
Start: 2021-10-04 — End: 2021-10-04
  Administered 2021-10-04: 4 mg via INTRAVENOUS
  Filled 2021-10-04: qty 2

## 2021-10-04 MED ORDER — SODIUM CHLORIDE 0.9 % IV BOLUS
500.0000 mL | Freq: Once | INTRAVENOUS | Status: AC
Start: 2021-10-04 — End: 2021-10-04
  Administered 2021-10-04: 500 mL via INTRAVENOUS

## 2021-10-04 MED ORDER — IPRATROPIUM-ALBUTEROL 0.5-2.5 (3) MG/3ML IN SOLN
3.0000 mL | Freq: Once | RESPIRATORY_TRACT | Status: AC
  Administered 2021-10-04: 3 mL via RESPIRATORY_TRACT
  Filled 2021-10-04: qty 3

## 2021-10-04 MED ORDER — FAMOTIDINE 20 MG PO TABS: 20.0000 mg | ORAL_TABLET | Freq: Every day | ORAL | 0 refills | Status: DC

## 2021-10-04 MED ORDER — FENTANYL CITRATE PF 50 MCG/ML IJ SOSY
50.0000 ug | PREFILLED_SYRINGE | Freq: Once | INTRAMUSCULAR | Status: AC
  Administered 2021-10-04: 50 ug via INTRAVENOUS
  Filled 2021-10-04: qty 1

## 2021-10-04 NOTE — ED Triage Notes (Signed)
Pt arrived POV from home c/o generalized abdominal cramping. Pt endorses nausea as well but no vomiting. Pt states she just left Sunday and was diagnosed with a stomach virus but she cannot take the pain.

## 2021-10-04 NOTE — Discharge Instructions (Signed)
I am also going to prescribe you an antacid called Pepcid.  Please take this once per day for the next 2 weeks.  Please follow-up with your regular doctor at your appointment tomorrow.  If you develop any new or worsening symptoms please come back to the emergency department.

## 2021-10-04 NOTE — ED Provider Notes (Signed)
[facility]ERG[facility] DEPARTMENT Provider Note   CSN: [id number] Arrival date & time: 10/04/21  1344     History  No chief complaint on file.   [name] is a 71 y.o. female.  HPI Patient is a 71 year old female with a history of diastolic CHF, CAD, asthma, dementia, hypertension, CKD 3A, who presents to the emergency department due to abdominal pain.  Patient recently admitted on June 1 and discharged on June 4 with similar complaints.  She had reassuring serial abdominal CTs and was discharged in stable condition.  She states that yesterday evening her pain began once again.  Worsens with p.o. intake.  Reports associated diarrhea and nausea.  She now also complains of a productive cough as well as chest pain when coughing.  Denies dysuria.    Home Medications Prior to Admission medications   Medication Sig Start Date End Date Taking? Authorizing Provider  famotidine (PEPCID) 20 MG tablet Take 1 tablet (20 mg total) by mouth daily for 14 days. 10/04/21 10/18/21 Yes Rayna Sexton, PA-C  acetaminophen-codeine (TYLENOL #3) 300-30 MG tablet Take 1 tablet by mouth every 6 (six) hours as needed for moderate pain or severe pain. 07/23/21   [provider]  albuterol (PROAIR HFA) 108 (90 Base) MCG/ACT inhaler Inhale 2 puffs into the lungs every 4 (four) hours as needed for wheezing or shortness of breath. 04/20/21   Parrett, Fonnie Mu, NP  albuterol (PROVENTIL) (2.5 MG/3ML) 0.083% nebulizer solution Take 3 mLs (2.5 mg total) by nebulization every 4 (four) hours as needed for wheezing or shortness of breath. 05/17/21   Tanda Rockers, MD  ALPRAZolam Duanne Moron) 0.5 MG tablet Take 1 tablet (0.5 mg total) by mouth 2 (two) times daily as needed for anxiety. Patient taking differently: Take 0.5 mg by mouth See admin instructions. Take 0.5 mg by mouth at bedtime and an additional 0.5 mg once a day as needed for anxiety 07/31/15   Theodis Blaze, MD  atorvastatin (LIPITOR) 40 MG  tablet Take 40 mg by mouth at bedtime.    [provider]  Biotin 5000 MCG CAPS Take 5,000 mcg by mouth daily.    [provider]  budesonide (PULMICORT) 0.5 MG/2ML nebulizer solution USE 1 VIAL IN NEBULIZER IN THE MORNING AND AT BEDTIME Patient taking differently: Take 0.5 mg by nebulization in the morning and at bedtime. 03/29/21   Tanda Rockers, MD  dicyclomine (BENTYL) 20 MG tablet Take 1 tablet (20 mg total) by mouth 3 (three) times daily as needed for spasms. 10/02/21   Shelly Coss, MD  diltiazem (CARDIZEM CD) 240 MG 24 hr capsule TAKE 1 CAPSULE BY MOUTH DAILY Patient taking differently: Take 240 mg by mouth daily. 07/27/21   Jerline Pain, MD  divalproex (DEPAKOTE ER) 500 MG 24 hr tablet TAKE 1 TABLET BY MOUTH AT  BEDTIME Patient taking differently: Take 500 mg by mouth at bedtime. 08/08/21   Marcial Pacas, MD  ferrous sulfate 325 (65 FE) MG tablet Take 325 mg by mouth daily with breakfast. 07/15/21   [provider]  fluticasone (FLONASE) 50 MCG/ACT nasal spray Place 2 sprays into both nostrils daily as needed for allergies.    [provider]  formoterol (PERFOROMIST) 20 MCG/2ML nebulizer solution USE 1 VIAL  IN  NEBULIZER TWICE  DAILY - morning and evening Patient taking differently: Take 20 mcg by nebulization in the morning and at bedtime. 03/29/21   Tanda Rockers, MD  glipiZIDE (GLUCOTROL) 5  MG tablet Take 2 tablets (10 mg total) by mouth daily before breakfast for 5 days, THEN 1 tablet (5 mg total) daily before breakfast. 08/24/21 11/22/21  Mercy Riding, MD  hydrALAZINE (APRESOLINE) 100 MG tablet TAKE 1 TABLET BY MOUTH 3 TIMES  DAILY Patient taking differently: Take 100 mg by mouth 3 (three) times daily. 07/27/21   Jerline Pain, MD  Inulin (FIBER CHOICE PO) Take 1 capsule by mouth daily.    [provider]  KLOR-CON M20 20 MEQ tablet Take 20 mEq by mouth every other day. 07/17/21   [provider]  latanoprost (XALATAN) 0.005 %  ophthalmic solution Place 1 drop into both eyes at bedtime. 02/05/20   [provider]  loratadine (CLARITIN) 10 MG tablet Take 10 mg by mouth daily.    [provider]  magnesium oxide (MAG-OX) 400 (240 Mg) MG tablet Take 1 tablet (400 mg total) by mouth daily. 05/14/21   Lavina Hamman, MD  memantine (NAMENDA) 10 MG tablet TAKE 1 TABLET BY MOUTH TWICE  DAILY 09/06/21   Suzzanne Cloud, NP  metFORMIN (GLUCOPHAGE) 1000 MG tablet Take 1,000 mg by mouth 2 (two) times daily with a meal.    [provider]  montelukast (SINGULAIR) 10 MG tablet Take 1 tablet (10 mg total) by mouth at bedtime. 05/25/21   Tanda Rockers, MD  Multiple Vitamins-Minerals (ONE-A-DAY WOMENS PO) Take 1 tablet by mouth daily with breakfast.    [provider]  nebivolol (BYSTOLIC) 10 MG tablet Take 1 tablet (10 mg total) by mouth daily. 04/10/19   Jerline Pain, MD  nitroGLYCERIN (NITROSTAT) 0.4 MG SL tablet Place 1 tablet (0.4 mg total) under the tongue every 5 (five) minutes as needed for chest pain. Don't exceed more than 3 doses in 15 mins 06/24/21   Aline August, MD  pantoprazole (PROTONIX) 40 MG tablet Take 1 tablet (40 mg total) by mouth 2 (two) times daily before a meal. 06/24/21   Starla Link, Kshitiz, MD  polyethylene glycol (MIRALAX / GLYCOLAX) 17 g packet Take 17 g by mouth daily as needed. 10/02/21   Shelly Coss, MD  Probiotic Product (EQ PROBIOTIC) CAPS Take 1 capsule by mouth every morning.    [provider]      Allergies    Tramadol and Chlorthalidone    Review of Systems   Review of Systems  All other systems reviewed and are negative. Ten systems reviewed and are negative for acute change, except as noted in the HPI.   Physical Exam Updated Vital Signs BP (!) 192/79   Pulse 73   Temp 98.2 F (36.8 C) (Oral)   Resp 15   Ht '5\' 2"'$  (1.575 m)   Wt 78.9 kg   LMP  (LMP Unknown)   SpO2 99%   BMI 31.83 kg/m  Physical Exam Vitals and nursing note reviewed.   Constitutional:      General: She is not in acute distress.    Appearance: Normal appearance. She is not ill-appearing, toxic-appearing or diaphoretic.  HENT:     Head: Normocephalic and atraumatic.     Right Ear: External ear normal.     Left Ear: External ear normal.     Nose: Nose normal.     Mouth/Throat:     Mouth: Mucous membranes are moist.     Pharynx: Oropharynx is clear. No oropharyngeal exudate or posterior oropharyngeal erythema.  Eyes:     Extraocular Movements: Extraocular movements intact.  Cardiovascular:  Rate and Rhythm: Normal rate and regular rhythm.     Pulses: Normal pulses.     Heart sounds: Normal heart sounds. No murmur heard.   No friction rub. No gallop.  Pulmonary:     Effort: Pulmonary effort is normal. No respiratory distress.     Breath sounds: No stridor. Wheezing present. No rhonchi or rales.     Comments: Expiratory wheezes noted in all lung fields.  No rales or rhonchi.  Oxygen saturations at 98% on room air.  Speaking in clear and complete sentences.  No respiratory distress noted. Abdominal:     General: Abdomen is flat.     Palpations: Abdomen is soft.     Tenderness: There is abdominal tenderness.     Comments: Abdomen is flat and soft.  Moderate tenderness noted diffusely across the abdomen.  Musculoskeletal:        General: Normal range of motion.     Cervical back: Normal range of motion and neck supple. No tenderness.  Skin:    General: Skin is warm and dry.  Neurological:     General: No focal deficit present.     Mental Status: She is alert and oriented to person, place, and time.  Psychiatric:        Mood and Affect: Mood normal.        Behavior: Behavior normal.   ED Results / Procedures / Treatments   Labs (all labs ordered are listed, but only abnormal results are displayed) Labs Reviewed  COMPREHENSIVE METABOLIC PANEL - Abnormal; Notable for the following components:      Result Value   Glucose, Bld 107 (*)     Creatinine, Ser 1.37 (*)    GFR, Estimated 41 (*)    All other components within normal limits  CBC WITH DIFFERENTIAL/PLATELET - Abnormal; Notable for the following components:   RBC 3.82 (*)    All other components within normal limits  RESP PANEL BY RT-PCR (FLU A&B, COVID) ARPGX2  C DIFFICILE QUICK SCREEN W PCR REFLEX    LIPASE, BLOOD  URINALYSIS, ROUTINE W REFLEX MICROSCOPIC  TROPONIN I (HIGH SENSITIVITY)  TROPONIN I (HIGH SENSITIVITY)   EKG None  Radiology CT ABDOMEN PELVIS WO CONTRAST  Result Date: 10/04/2021 CLINICAL DATA:  Abdominal pain EXAM: CT ABDOMEN AND PELVIS WITHOUT CONTRAST TECHNIQUE: Multidetector CT imaging of the abdomen and pelvis was performed following the standard protocol without IV contrast. RADIATION DOSE REDUCTION: This exam was performed according to the departmental dose-optimization program which includes automated exposure control, adjustment of the mA and/or kV according to patient size and/or use of iterative reconstruction technique. COMPARISON:  10/02/21 FINDINGS: Lower chest: No acute abnormality. Hepatobiliary: No focal liver abnormality is seen. No gallstones, gallbladder wall thickening, or biliary dilatation. Pancreas: Unremarkable. No pancreatic ductal dilatation or surrounding inflammatory changes. Spleen: Normal in size without focal abnormality. Adrenals/Urinary Tract: Adrenal glands are within normal limits. Kidneys are well visualized bilaterally. Stable punctate calcification is noted in the left kidney likely representing nonobstructing stone. The ureters are within normal limits bilaterally. The bladder is well distended. Stomach/Bowel: Scattered diverticula are seen within the colon. No findings to suggest diverticulitis are seen. The appendix is within normal limits. Small bowel and stomach are unremarkable. Vascular/Lymphatic: Aortic atherosclerosis. No enlarged abdominal or pelvic lymph nodes. Reproductive: Status post hysterectomy. No adnexal  masses. Other: No abdominal wall hernia or abnormality. No abdominopelvic ascites. Musculoskeletal: No acute or significant osseous findings. IMPRESSION: Punctate nonobstructing left renal stone stable from the prior study.  Diverticulosis without diverticulitis. No other focal abnormality is seen.  No change from the prior exam. Electronically Signed   By: Inez Catalina M.D.   On: 10/04/2021 21:16   DG Chest Portable 1 View  Result Date: 10/04/2021 CLINICAL DATA:  Chest pain and shortness of breath.  Nausea. EXAM: PORTABLE CHEST 1 VIEW COMPARISON:  Radiograph 08/20/2021 FINDINGS: The cardiomediastinal contours are normal. The lungs are clear. Pulmonary vasculature is normal. No consolidation, pleural effusion, or pneumothorax. No acute osseous abnormalities are seen. IMPRESSION: No acute chest findings. Electronically Signed   By: Keith Rake M.D.   On: 10/04/2021 19:08    Procedures Procedures   Medications Ordered in ED Medications  sodium chloride 0.9 % bolus 500 mL (500 mLs Intravenous New Bag/Given 10/04/21 2014)  ondansetron (ZOFRAN) injection 4 mg (4 mg Intravenous Given 10/04/21 2011)  fentaNYL (SUBLIMAZE) injection 50 mcg (50 mcg Intravenous Given 10/04/21 2012)  ipratropium-albuterol (DUONEB) 0.5-2.5 (3) MG/3ML nebulizer solution 3 mL (3 mLs Nebulization Given 10/04/21 2014)    ED Course/ Medical Decision Making/ A&P                           Medical Decision Making Amount and/or Complexity of Data Reviewed Labs: ordered. Radiology: ordered.  Risk Prescription drug management.   Pt is a 71 y.o. female who presents to the emergency department due to abdominal pain.  Recent admission for similar symptoms.  Please see HPI above for additional information.  Labs: CBC with RBCs at 3.82. CMP with a glucose of 107, creatinine of 1.37, GFR 41. C. difficile quick screen is negative. Respiratory panel is negative. Lipase within normal limits at 24. Troponin of 5 with a repeat of  5.  Imaging: Chest x-ray shows no acute chest findings.  CT scan of the abdomen/pelvis without contrast shows punctate nonobstructing left renal stone stable from the prior study.  Diverticulosis without diverticulitis.  No other focal abnormality is seen.  No change from the prior exam.  I, Rayna Sexton, PA-C, personally reviewed and evaluated these images and lab results as part of my medical decision-making.  On my exam heart is regular rate and rhythm without murmurs, rubs, or gallops.  Lungs are clear to auscultation bilaterally.  Abdomen is flat and soft with moderate tenderness noted diffusely across the abdomen.  Patient afebrile, nontachycardic, and nontoxic-appearing.  Lab work obtained with findings as noted above.  CBC without leukocytosis.  CMP with no electrolyte derangements.  Glucose 107.  Kidney function appears similar to the patient's baseline.  ECG does not appear ischemic.  Chest x-ray is negative.  Troponins flat at 5.  Does not appear consistent with ACS at this time.  During patient's recent admission they were unable to obtain a C. difficile study.  This was obtained today and is negative.  Unsure of the source of the patient's symptoms.  She states that she is currently on a PPI.  We will also add a course of Pepcid as well.  She has a follow-up appointment with her PCP tomorrow for reevaluation.  Patient also found to be wheezing on my exam.  She was given a DuoNeb with improvement.  Oxygen saturations at 99% on room air.  Does not appear to be in any respiratory distress at this time.  History of asthma.  Urged her to continue taking her inhalers.  She appears stable for discharge at this time and she is agreeable.  We discussed return precautions.  Her questions were answered and she was amicable at the time of discharge.  Note: Portions of this report may have been transcribed using voice recognition software. Every effort was made to ensure accuracy; however,  inadvertent computerized transcription errors may be present.   Final Clinical Impression(s) / ED Diagnoses Final diagnoses:  Generalized abdominal pain   Rx / DC Orders ED Discharge Orders          Ordered    famotidine (PEPCID) 20 MG tablet  Daily        10/04/21 2222              Rayna Sexton, PA-C 10/04/21 2231    Teressa Lower, MD 10/05/21 1535

## 2021-10-04 NOTE — ED Provider Triage Note (Signed)
Emergency Medicine Provider Triage Evaluation Note  [name] , a 71 y.o. female  was evaluated in triage.  Pt complains of abdominal pain.  She was recently admitted to the hospital on 09/29/2021 for generalized abdominal pain.  She states that her symptoms are similar to then but persistent.  She denies nausea/vomiting.  She says her stools have been loose the past couple days.  They are dark in appearance but she takes iron supplement and states they are no different than usual.  Denies fever, chills, night sweats, chest pain, shortness of breath, nausea/vomiting, urinary symptoms.  Review of Systems  Positive: See above Negative:   Physical Exam  BP (!) 173/73 (BP Location: Right Arm)   Pulse 67   Temp 98.2 F (36.8 C) (Oral)   Resp 16   Ht '5\' 2"'$  (1.575 m)   Wt 78.9 kg   LMP  (LMP Unknown)   SpO2 98%   BMI 31.83 kg/m  Gen:   Awake, no distress   Resp:  Normal effort  MSK:   Moves extremities without difficulty  Other:  Generalized abdominal tenderness without point of focality.  No overlying skin abnormalities.  Medical Decision Making  Medically screening exam initiated at 3:04 PM.  Appropriate orders placed.  JHADA RISK was informed that the remainder of the evaluation will be completed by another provider, this initial triage assessment does not replace that evaluation, and the importance of remaining in the ED until their evaluation is complete.     Wilnette Kales, [place] 10/04/21 1515

## 2021-10-27 ENCOUNTER — Telehealth: Payer: Self-pay | Admitting: Internal Medicine

## 2021-10-27 MED ORDER — AZITHROMYCIN 250 MG PO TABS: ORAL_TABLET | ORAL | 0 refills | Status: DC

## 2021-10-27 MED ORDER — PREDNISONE 10 MG PO TABS: ORAL_TABLET | ORAL | 0 refills | Status: DC

## 2021-10-27 NOTE — Telephone Encounter (Signed)
Called and spoke with patient. She verbalized understanding of recommendations. RX for zpak and prednisone have been sent in for her.   Nothing further needed at time of call.

## 2021-10-27 NOTE — Telephone Encounter (Signed)
Should be using budesonide and formoterol bid automatically and supplementing with albuterol up to every 4 hours if needed   Paxlovid can be called in but pharmacy may need to adjust the dose and she is fully vaccinated so don't necessarily need it (has not worked as well with the new variant)  Instead rec Rx zpak Prednisone 10 mg take  4 each am x 2 days,   2 each am x 2 days,  1 each am x 2 days and stop

## 2021-10-27 NOTE — Telephone Encounter (Signed)
Called and spoke with patient. She stated that she tested positive for covid this morning. She woke up on Tuesday 06/27 with a productive cough, fever and fatigue. Over the past few days, the symptoms have gotten worse. She confirmed that she is not on any antivirals for covid.   She has been using her Pulmicort daily as well as her albuterol nebs as needed.   Pharmacy is [facility].   Dr. Melvyn Novas, can you please advise? Thanks!

## 2021-12-28 NOTE — Therapy (Signed)
[facility] [address], [place], [place], [address] Phone: [phone]   Fax:  [phone]  Patient Details  Name: [name] MRN: [id number] Date of Birth: Oct 08, 1950 Referring Provider:  Christinia Gully, MD  Encounter Date: 12/28/2021  SPEECH THERAPY DISCHARGE SUMMARY  Visits from Start of Care: 4  Current functional level related to goals / functional outcomes: Pt did not return after 4 visits. SLP called pt to suggest scheduling more visits but no more ST visits were scheduled.  Goals from last attended session follow:  SLP Short Term Goals - 07/27/21 1356                SLP SHORT TERM GOAL #1    Title pt will use AB in short answers (4-7 words) 80% of the time in 2 sessions     Status Achieved     Target Date 08/05/21          SLP SHORT TERM GOAL #2    Title pt will use at least one relaxation strategy when she feels VCD triggered between 3 sessions     Time 4     Period Weeks     Status On-going     Target Date 08/05/21          SLP SHORT TERM GOAL #3    Title pt will tell SLP 3 relaxation techniques she can use to mitigate s/sx VCD in 3 sessions     Baseline 07-26-21     Time 4     Period Weeks     Status On-going     Target Date 08/05/21                     SLP Long Term Goals - 07/27/21 1356                SLP LONG TERM GOAL #1    Title pt will exhibit no more than 3 throat clears and/or coughs in 3 sessions     Baseline 07-11-21, 07-20-21     Status Achieved     Target Date 09/09/21          SLP LONG TERM GOAL #2    Title pt will tell SLP she utilized relaxation techniques to quell a coughing episode/need to throat clear between 3 sessions     Time 8     Period Weeks     Status On-going     Target Date 09/09/21          SLP LONG TERM GOAL #3    Title pt will demo AB with 10 minutes simple conversation in 2 sessions     Baseline 07-20-21     Status Achieved     Target Date 09/09/21           SLP LONG TERM GOAL #4    Title pt will indicate progress in ST by a score > 16 on Communication Participation Survey     Time 8     Period Weeks     Status On-going     Target Date 09/09/21          SLP LONG TERM GOAL #5    Title Pt will complete 20 minutes of mod complex/complex conversation using AB 80% of the time in 3 sessions     Baseline 07-26-21     Status New     Target Date 09/09/21  Plan - 07/27/21 1355       Clinical Impression Statement Agness presented at eval with overt s/sx of vocal cord dysfunction/irritable larynx/cyclical cough (VCD), including, frequent coughing/throat clearing, tightness in the throat, a feeling of choking,and mildly hoarse voice. .Pt used abdominal breathing throughout session and in 25 minutes conversation. Pt will cont to benefit from skilled ST teaching pt how to use abdominal breathing in conversation, as well as training pt in how to use visual imagery for relaxation of voice tract.        Remaining deficits: Deficits likely remain.   Education / Equipment: Compensations for VCD, abdominal breathing   Patient agrees to discharge. Patient goals were not met. Patient is being discharged due to not returning since the last visit.Marland Kitchen      [place], [place] 12/28/2021, 9:45 AM  [facility] [address] [place], [place], [address] Phone: [phone]   Fax:  [phone]

## 2022-01-11 ENCOUNTER — Telehealth: Payer: Self-pay | Admitting: Cardiology

## 2022-01-11 NOTE — Telephone Encounter (Signed)
   [facility] Pre-operative Risk Assessment    Request for surgical clearance:  What type of surgery is being performed?  Left Knee Arthroscopy   When is this surgery scheduled?  TBD   What type of clearance is required (medical clearance vs. Pharmacy clearance to hold med vs. Both)?  Both   Are there any medications that need to be held prior to surgery and how long? Their office is requesting our recommendation regarding medications   Practice name and name of physician performing surgery?  [facility]  Dr. Dorna Leitz   What is your office phone number? [phone]    7.   What is your office fax number? [phone]  8.   Anesthesia type (None, local, MAC, general) ?  General    [name] 01/11/2022, 2:16 PM

## 2022-01-13 ENCOUNTER — Telehealth: Payer: Self-pay | Admitting: Internal Medicine

## 2022-01-13 NOTE — Telephone Encounter (Signed)
Fax received from Dr. Dorna Leitz with [facility] to perform a LEFT KNEE ARTHROSCOPY on patient.  Patient needs surgery clearance. Surgery is TBD. Patient was seen on 06/28/2021. Office protocol is a risk assessment can be sent to surgeon if patient has been seen in 60 days or less.   Sending to Dr Melvyn Novas for risk assessment or recommendations if patient needs to be seen in office prior to surgical procedure.    Next office visit is 03/15/2022 with Dr Melvyn Novas to get cleared for surgery

## 2022-01-13 NOTE — Telephone Encounter (Signed)
Primary Cardiologist:Mark Marlou Porch, MD   Preoperative team, please contact this patient and set up a phone call appointment for further preoperative risk assessment. Please obtain consent and complete medication review. Thank you for your help.   Pharmacy request indicated, however patient is not on any antiplatelets or anticoagulants.    Jacqueline Life, NP-C     01/13/2022, 11:02 AM [address] Office [phone] Fax [phone]

## 2022-01-15 NOTE — Telephone Encounter (Signed)
Needs appt first since this is not urgent surgery - move up to next available

## 2022-01-16 NOTE — Telephone Encounter (Signed)
Left message to call for tele pre op appt

## 2022-01-17 NOTE — Telephone Encounter (Signed)
Left message x 2 to call back for tele pre op appt.  

## 2022-01-18 NOTE — Telephone Encounter (Signed)
Patient was returning call. Please advise ?

## 2022-01-18 NOTE — Telephone Encounter (Signed)
Pt has appt 01/20/22 with Christen Bame, NP

## 2022-01-18 NOTE — Telephone Encounter (Signed)
Pt called back though I was unavailable to take the call at the moment. I called the pt back within about 5 minutes. I got her vm and left message to call back for tele appt

## 2022-01-18 NOTE — Telephone Encounter (Signed)
   Name: FEY COGHILL  DOB: November 21, 1950  MRN: [id number]  Primary Cardiologist: Candee Furbish, MD  Preop box received message from operator that patient was returning call (this was documented in the other phone note generated by pulmonology so I am moving the documentation here). Notes reviewed.  With history of high BP, recent admission 09/2021 with wheezing/AKI amongst other issues, cognitive impairment/dementia listed in chart, and >6 months from prior visit, not ideal virtual visit candidate. Recommend in-office visit in order to better assess preoperative cardiovascular risk.  Pre-op covering staff: - Please schedule appointment and call patient to inform them. If patient already had an upcoming appointment within acceptable timeframe, please add "pre-op clearance" to the appointment notes so provider is aware. - Please contact requesting surgeon's office via preferred method (i.e, phone, fax) to inform them of need for appointment prior to surgery.   Charlie Pitter, PA-C  01/18/2022, 2:26 PM

## 2022-01-18 NOTE — Progress Notes (Signed)
Cardiology Office Note:    Date:  01/20/2022   ID:  [name], DOB 1950/05/16, MRN [id number]  PCP:  [name], MD   [facility] Providers Cardiologist:  Candee Furbish, MD     Referring MD: [name], MD   Chief Complaint: preoperative cardiac evaluation  History of Present Illness:    [name] is a very pleasant 71 y.o. female with a hx of CAD s/p NSTEMI, PE, CKD stage 3, difficult to control HTN, severe persistent asthma  NSTEMI 12/2015 with LHC that revealed dRCA 90% stenosis and mRCA 70% stenosis treated with PTCA/DES x2 overlapping stents to mid and distal RCA.  Nuclear stress test 05/2020 with no evidence of ischemia.  Hypertension difficult to control.  Previously tried irbesartan, valsartan, valsartan-HCTZ (thought to have stopped ACE and ARB due to cough and asthma symptoms), metoprolol succinate, amlodipine.   She presented to the ED on 06/19/2021 for respiratory distress.  Her symptoms started 3 days prior, taking nebulizer and steroids at home with no improvement.  ENT solved no anatomical issue and recommendation for treatment for chronic reflux.  She was feeling better but continued to wheeze.  Discharged on oral prednisone, Protonix, Carafate, antihistamines, and as needed Maalox.  She had 6 hospitalizations from December 2022 to February 2023.  She was last seen in our office on 07/14/2021 by Dr. Marlou Porch at which time BP moderately elevated on Bystolic, diltiazem, hydralazine. No symptoms of angina. Follow-up in 6 months was recommended. Notation that aspirin discontinued due to GERD.   She is here today for preoperative clearance for upcoming knee surgery. Is feeling well, no specific concerns today. Has mild left LE edema due to needing knee surgery. Multiple hospitalizations for asthma. BP has been elevated recently.  24 Diet recall: Coffee, Fried fish, slaw, french fries Typically eats a boiled egg every morning with a few strips of bacon. Stressful  family situation with her husband at home, requires a lot of care, has stomach cancer with metastasis. She denies chest pain, shortness of breath, fatigue, palpitations, melena, hematuria, hemoptysis, diaphoresis, weakness, presyncope, syncope, orthopnea, and PND. Reports compliance with all of her anti-hypertensives. PCP recently increased diltiazem and bystolic.   Past Medical History:  Diagnosis Date   Anxiety    Arthritis    Asthma    Chronic diastolic CHF (congestive heart failure) ([place]) 07/17/2016   Coronary artery disease    a. NSTEMI 12/2015 - LHC 01/18/16: s/p overlapping DESx2 to RCA, 10% ost-prox Cx,10% mLAD.   Dementia ([place])    Depression    Diabetes mellitus    Dyslipidemia    GERD (gastroesophageal reflux disease)    History of seizure    Hypertension    Hypertensive heart disease    NSTEMI (non-ST elevated myocardial infarction) ([place]) 12/2015   Stage 3 chronic kidney disease (HCC)     Past Surgical History:  Procedure Laterality Date   ABDOMINAL HYSTERECTOMY     CARDIAC CATHETERIZATION  1994   minimal LAD dz, no other dz, EF normal   CARDIAC CATHETERIZATION N/A 01/18/2016   Procedure: Left Heart Cath and Coronary Angiography;  Surgeon: Burnell Blanks, MD;  Location: [place] [facility];  Service: Cardiovascular;  Laterality: N/A;   CARDIAC CATHETERIZATION N/A 01/18/2016   Procedure: Coronary Stent Intervention;  Surgeon: Burnell Blanks, MD;  Location: [place] [facility];  Service: Cardiovascular;  Laterality: N/A;   COLONOSCOPY WITH PROPOFOL N/A 04/09/2014   Procedure: COLONOSCOPY WITH PROPOFOL;  Surgeon: Herbie Baltimore  Buccini V, MD;  Location: [facility];  Service: Endoscopy;  Laterality: N/A;   CORONARY STENT PLACEMENT  01/19/2016   STENT SYNERGY DES 6.07P71 drug eluting stent was successfully placed, and overlaps the 2.5 x 38 mm Synergy stent placed distally.   ESOPHAGOGASTRODUODENOSCOPY (EGD) WITH PROPOFOL N/A 04/09/2014   Procedure:  ESOPHAGOGASTRODUODENOSCOPY (EGD) WITH PROPOFOL;  Surgeon: Cleotis Nipper, MD;  Location: [facility];  Service: Endoscopy;  Laterality: N/A;   FOOT SURGERY Bilateral    approx ~2000   HEMORRHOID SURGERY     HERNIA REPAIR     KNEE ARTHROSCOPY     RADIOACTIVE SEED GUIDED EXCISIONAL BREAST BIOPSY Left 01/20/2021   Procedure: RADIOACTIVE SEED GUIDED EXCISIONAL LEFT BREAST BIOPSY;  Surgeon: Stark Klein, MD;  Location: [place];  Service: General;  Laterality: Left;    Current Medications: Current Meds  Medication Sig   albuterol (PROAIR HFA) 108 (90 Base) MCG/ACT inhaler Inhale 2 puffs into the lungs every 4 (four) hours as needed for wheezing or shortness of breath.   albuterol (PROVENTIL) (2.5 MG/3ML) 0.083% nebulizer solution Take 3 mLs (2.5 mg total) by nebulization every 4 (four) hours as needed for wheezing or shortness of breath.   ALPRAZolam (XANAX) 0.5 MG tablet Take 1 tablet (0.5 mg total) by mouth 2 (two) times daily as needed for anxiety. (Patient taking differently: Take 0.5 mg by mouth See admin instructions. Take 0.5 mg by mouth at bedtime and an additional 0.5 mg once a day as needed for anxiety)   atorvastatin (LIPITOR) 40 MG tablet Take 40 mg by mouth at bedtime.   Biotin 5000 MCG CAPS Take 5,000 mcg by mouth daily.   budesonide (PULMICORT) 0.5 MG/2ML nebulizer solution USE 1 VIAL IN NEBULIZER IN THE MORNING AND AT BEDTIME (Patient taking differently: Take 0.5 mg by nebulization in the morning and at bedtime.)   chlorthalidone (HYGROTON) 25 MG tablet Take 0.5 tablets (12.5 mg total) by mouth daily.   diltiazem (CARDIZEM CD) 360 MG 24 hr capsule Take 360 mg by mouth daily.   divalproex (DEPAKOTE ER) 500 MG 24 hr tablet TAKE 1 TABLET BY MOUTH AT  BEDTIME (Patient taking differently: Take 500 mg by mouth at bedtime.)   ferrous sulfate 325 (65 FE) MG tablet Take 325 mg by mouth daily with breakfast.   fluticasone (FLONASE) 50 MCG/ACT nasal spray Place 2 sprays into both nostrils  daily as needed for allergies.   formoterol (PERFOROMIST) 20 MCG/2ML nebulizer solution USE 1 VIAL  IN  NEBULIZER TWICE  DAILY - morning and evening (Patient taking differently: Take 20 mcg by nebulization in the morning and at bedtime.)   glipiZIDE (GLUCOTROL) 5 MG tablet Take 2 tablets (10 mg total) by mouth daily before breakfast for 5 days, THEN 1 tablet (5 mg total) daily before breakfast.   hydrALAZINE (APRESOLINE) 100 MG tablet TAKE 1 TABLET BY MOUTH 3 TIMES  DAILY (Patient taking differently: Take 100 mg by mouth 3 (three) times daily.)   latanoprost (XALATAN) 0.005 % ophthalmic solution Place 1 drop into both eyes at bedtime.   magnesium oxide (MAG-OX) 400 (240 Mg) MG tablet Take 1 tablet (400 mg total) by mouth daily.   memantine (NAMENDA) 10 MG tablet TAKE 1 TABLET BY MOUTH TWICE  DAILY   metFORMIN (GLUCOPHAGE) 1000 MG tablet Take 1,000 mg by mouth 2 (two) times daily with a meal.   montelukast (SINGULAIR) 10 MG tablet Take 1 tablet (10 mg total) by mouth at bedtime.   Multiple Vitamins-Minerals (ONE-A-DAY WOMENS  PO) Take 1 tablet by mouth daily with breakfast.   nebivolol (BYSTOLIC) 10 MG tablet Take 1 tablet (10 mg total) by mouth daily. (Patient taking differently: Take 10 mg by mouth 2 (two) times daily.)   nitroGLYCERIN (NITROSTAT) 0.4 MG SL tablet Place 1 tablet (0.4 mg total) under the tongue every 5 (five) minutes as needed for chest pain. Don't exceed more than 3 doses in 15 mins   pantoprazole (PROTONIX) 40 MG tablet Take 1 tablet (40 mg total) by mouth 2 (two) times daily before a meal.   polyethylene glycol (MIRALAX / GLYCOLAX) 17 g packet Take 17 g by mouth daily as needed.   potassium chloride (KLOR-CON M) 10 MEQ tablet Take 1 tablet (10 mEq total) by mouth daily.   Probiotic Product (EQ PROBIOTIC) CAPS Take 1 capsule by mouth every morning.     Allergies:   Tramadol and Chlorthalidone   Social History   Socioeconomic History   Marital status: Married    Spouse name:  Not on file   Number of children: 3   Years of education: some college   Highest education level: Not on file  Occupational History   Occupation: Nutritional therapist  Tobacco Use   Smoking status: Former    Packs/day: 1.00    Years: 30.00    Total pack years: 30.00    Types: Cigarettes    Quit date: 01/09/2004    Years since quitting: 18.0   Smokeless tobacco: Never  Vaping Use   Vaping Use: Never used  Substance and Sexual Activity   Alcohol use: No    Alcohol/week: 0.0 standard drinks of alcohol   Drug use: No   Sexual activity: Not on file  Other Topics Concern   Not on file  Social History Narrative   Pt lives with husband in [place].   Right-handed.   2 cups caffeine per day.   Social Determinants of Health   Financial Resource Strain: Not on file  Food Insecurity: Not on file  Transportation Needs: Not on file  Physical Activity: Not on file  Stress: Not on file  Social Connections: Not on file     Family History: The patient's family history includes Allergies in her mother; Asthma in her mother; CAD (age of onset: 52) in her father; CAD (age of onset: 85) in her sister; Congestive Heart Failure in her sister; Diabetes in her father; Hypertension in her father.  ROS:   Please see the history of present illness.    + LLE edema All other systems reviewed and are negative.  Labs/Other Studies Reviewed:    The following studies were reviewed today:  Nuclear Stress Test 05/31/20  Nuclear stress EF: 75%. There was no ST segment deviation noted during stress. The study is normal. This is a low risk study. The left ventricular ejection fraction is hyperdynamic (>65%).   There is significant bowel radioisotope uptake in the vicinity of the inferior LV wall, without evidence of perfusion defect.    No ischemia or infarction on perfusion imaging.    Echo 08/13/2017  Left ventricle: The cavity size was normal. There was moderate    concentric hypertrophy.  Systolic function was vigorous. The    estimated ejection fraction was in the range of 65% to 70%. Wall    motion was normal; there were no regional wall motion    abnormalities. Features are consistent with a pseudonormal left    ventricular filling pattern, with concomitant abnormal relaxation    and increased  filling pressure (grade 2 diastolic dysfunction).    Doppler parameters are consistent with high ventricular filling    pressure.  - Mitral valve: There was trivial regurgitation.  - Pulmonary arteries: PA peak pressure: 34 mm Hg (S).   LHC 01/18/2016  Mid RCA lesion, 70 %stenosed. Dist RCA lesion, 90 %stenosed. A STENT SYNERGY DES [id number] drug eluting stent was successfully placed, and overlaps the 2.5 x 38 mm Synergy stent placed distally. Prox RCA lesion, 80 %stenosed. Post intervention, there is a 0% residual stenosis. Ost Cx to Prox Cx lesion, 10 %stenosed. Mid LAD lesion, 10 %stenosed.   1. NSTEMI 2. Severe single vessel CAD with severe stenosis in the mid and distal RCA 3. Successful PTCA/DES x 2 mid and distal RCA   Recommendations: Dual anti-platelet therapy with ASA and Brilinta for one year. Continue statin.   Recent Labs: 05/13/2021: TSH 0.622 06/24/2021: Magnesium 2.6 08/20/2021: B Natriuretic Peptide 24.0 10/04/2021: ALT 17; BUN 17; Creatinine, Ser 1.37; Hemoglobin 12.2; Platelets 333; Potassium 3.9; Sodium 140  Recent Lipid Panel    Component Value Date/Time   CHOL 196 07/18/2016 0142   TRIG 203 (H) 07/18/2016 0142   HDL 48 07/18/2016 0142   CHOLHDL 4.1 07/18/2016 0142   VLDL 41 (H) 07/18/2016 0142   LDLCALC 107 (H) 07/18/2016 0142     Risk Assessment/Calculations:      Physical Exam:    VS:  BP (!) 158/92   Pulse 68   Ht '5\' 2"'$  (1.575 m)   Wt 165 lb (74.8 kg)   LMP  (LMP Unknown)   BMI 30.18 kg/m     Wt Readings from Last 3 Encounters:  01/20/22 165 lb (74.8 kg)  10/04/21 174 lb (78.9 kg)  09/29/21 176 lb 5.9 oz (80 kg)     GEN:  Well  nourished, well developed in no acute distress HEENT: Normal NECK: No JVD; No carotid bruits CARDIAC: RRR, no murmurs, rubs, gallops RESPIRATORY:  Clear to auscultation without rales, wheezing or rhonchi  ABDOMEN: Soft, non-tender, non-distended MUSCULOSKELETAL:  No edema; No deformity. 2+ pedal pulses, equal bilaterally SKIN: Warm and dry NEUROLOGIC:  Alert and oriented x 3 PSYCHIATRIC:  Normal affect   EKG:  EKG is ordered today.  The ekg ordered today demonstrates NSR at 68 bpm, minimal voltage for LVH, LAD, no ST abnormality   HYPERTENSION CONTROL Vitals:   01/20/22 0904 01/20/22 0928  BP: (!) 180/82 (!) 158/92    The patient's blood pressure is elevated above target today.  In order to address the patient's elevated BP: A new medication was prescribed today.     Diagnoses:    1. Essential hypertension   2. Coronary artery disease involving native coronary artery of native heart without angina pectoris   3. Hyperlipidemia LDL goal <70   4. Preoperative cardiovascular examination    Assessment and Plan:     Preoperative cardiac evaluation: She does not have symptoms of chest pain or dyspnea that are concerning and is able to achieve > 4 METS activity, however I would not recommend that she proceed with surgery until BP is better controlled. Her risk is relatively low from a cardiac perspective. According to the Revised Cardiac Risk Index (RCRI), her Perioperative Risk of Major Cardiac Event is (%): 0.9. Her Functional Capacity in METs is: 5.07 according to the Duke Activity Status Index (DASI). Needs better BP control prior to surgery.   Hypertension: BP is significantly elevated today.  She eats a high sodium diet. Emphasized  the importance of low-sodium, mostly plant-based diet such as Dash and Mediterranean. Will add low dose chlorthalidone today.  She is going for lab work today for nephrologist. We will follow-up with these results on Monday.  I will see her back in the  office in 1 week for management of blood pressure.   CAD without angina: Severe single-vessel CAD with severe stenosis in the mid and distal RCA with successful PTCA/DES x2 on cath 2017. She denies chest pain, dyspnea, or other symptoms concerning for angina. NST 05/2020 with no evidence of ischemia, normal LV function, low risk. No indication for further ischemic evaluation at this time.  Continue atorvastatin, nebivolol. No longer on asa due GERD.   CKD Stage 3: Creatinine 1.28 on 01/10/22. Has lab work for [place] [facility] today.  We will attempt to get these results to monitor kidney function on current antihypertensives. Restarting low dose chlorthalidone today for elevated BP. I will see her back in a week and we will determine appropriate timing to recheck BMET.     Disposition: 1 week with me  Medication Adjustments/Labs and Tests Ordered: Current medicines are reviewed at length with the patient today.  Concerns regarding medicines are outlined above.  Orders Placed This Encounter  Procedures   EKG 12-Lead   Meds ordered this encounter  Medications   chlorthalidone (HYGROTON) 25 MG tablet    Sig: Take 0.5 tablets (12.5 mg total) by mouth daily.    Dispense:  15 tablet    Refill:  11   potassium chloride (KLOR-CON M) 10 MEQ tablet    Sig: Take 1 tablet (10 mEq total) by mouth daily.    Dispense:  30 tablet    Refill:  11    Patient Instructions  Medication Instructions:   START Chlorthalidone one half (0.5 mg) tablet by mouth (12.5 mg) daily.   START K-dur one (1) tablet by mouth  (10 mEq) daily.   *If you need a refill on your cardiac medications before your next appointment, please call your pharmacy*   Lab Work:  None ordered.  If you have labs (blood work) drawn today and your tests are completely normal, you will receive your results only by: [place] (if you have MyChart) OR A paper copy in the mail If you have any lab test that is abnormal or we need to  change your treatment, we will call you to review the results.   Testing/Procedures:  None ordered.   Follow-Up: At [facility], you and your health needs are our priority.  As part of our continuing mission to provide you with exceptional heart care, we have created designated Provider Care Teams.  These Care Teams include your primary Cardiologist (physician) and Advanced Practice Providers (APPs -  Physician Assistants and Nurse Practitioners) who all work together to provide you with the care you need, when you need it.  We recommend signing up for the patient portal called "MyChart".  Sign up information is provided on this After Visit Summary.  MyChart is used to connect with patients for Virtual Visits (Telemedicine).  Patients are able to view lab/test results, encounter notes, upcoming appointments, etc.  Non-urgent messages can be sent to your provider as well.   To learn more about what you can do with MyChart, go to [email or web address].    Your next appointment:   1 week(s)  The format for your next appointment:   In Person  Provider:   Christen Bame, NP  Other Instructions  DASH Eating Plan DASH stands for Dietary Approaches to Stop Hypertension. The DASH eating plan is a healthy eating plan that has been shown to: Reduce high blood pressure (hypertension). Reduce your risk for type 2 diabetes, heart disease, and stroke. Help with weight loss. What are tips for following this plan? Reading food labels Check food labels for the amount of salt (sodium) per serving. Choose foods with less than 5 percent of the Daily Value of sodium. Generally, foods with less than 300 milligrams (mg) of sodium per serving fit into this eating plan. To find whole grains, look for the word "whole" as the first word in the ingredient list. Shopping Buy products labeled as "low-sodium" or "no salt added." Buy fresh foods. Avoid canned foods and pre-made or frozen  meals. Cooking Avoid adding salt when cooking. Use salt-free seasonings or herbs instead of table salt or sea salt. Check with your health care provider or pharmacist before using salt substitutes. Do not fry foods. Cook foods using healthy methods such as baking, boiling, grilling, roasting, and broiling instead. Cook with heart-healthy oils, such as olive, canola, avocado, soybean, or sunflower oil. Meal planning  Eat a balanced diet that includes: 4 or more servings of fruits and 4 or more servings of vegetables each day. Try to fill one-half of your plate with fruits and vegetables. 6-8 servings of whole grains each day. Less than 6 oz (170 g) of lean meat, poultry, or fish each day. A 3-oz (85-g) serving of meat is about the same size as a deck of cards. One egg equals 1 oz (28 g). 2-3 servings of low-fat dairy each day. One serving is 1 cup (237 mL). 1 serving of nuts, seeds, or beans 5 times each week. 2-3 servings of heart-healthy fats. Healthy fats called omega-3 fatty acids are found in foods such as walnuts, flaxseeds, fortified milks, and eggs. These fats are also found in cold-water fish, such as sardines, salmon, and mackerel. Limit how much you eat of: Canned or prepackaged foods. Food that is high in trans fat, such as some fried foods. Food that is high in saturated fat, such as fatty meat. Desserts and other sweets, sugary drinks, and other foods with added sugar. Full-fat dairy products. Do not salt foods before eating. Do not eat more than 4 egg yolks a week. Try to eat at least 2 vegetarian meals a week. Eat more home-cooked food and less restaurant, buffet, and fast food. Lifestyle When eating at a restaurant, ask that your food be prepared with less salt or no salt, if possible. If you drink alcohol: Limit how much you use to: 0-1 drink a day for women who are not pregnant. 0-2 drinks a day for men. Be aware of how much alcohol is in your drink. In the [place] one  drink equals one 12 oz bottle of beer (355 mL), one 5 oz glass of wine (148 mL), or one 1 oz glass of hard liquor (44 mL). General information Avoid eating more than 2,300 mg of salt a day. If you have hypertension, you may need to reduce your sodium intake to 1,500 mg a day. Work with your health care provider to maintain a healthy body weight or to lose weight. Ask what an ideal weight is for you. Get at least 30 minutes of exercise that causes your heart to beat faster (aerobic exercise) most days of the week. Activities may include walking, swimming, or biking. Work with your health care  provider or dietitian to adjust your eating plan to your individual calorie needs. What foods should I eat? Fruits All fresh, dried, or frozen fruit. Canned fruit in natural juice (without added sugar). Vegetables Fresh or frozen vegetables (raw, steamed, roasted, or grilled). Low-sodium or reduced-sodium tomato and vegetable juice. Low-sodium or reduced-sodium tomato sauce and tomato paste. Low-sodium or reduced-sodium canned vegetables. Grains Whole-grain or whole-wheat bread. Whole-grain or whole-wheat pasta. Brown rice. [place]. Bulgur. Whole-grain and low-sodium cereals. Pita bread. Low-fat, low-sodium crackers. Whole-wheat flour tortillas. Meats and other proteins Skinless chicken or [place]. Ground chicken or [place]. Pork with fat trimmed off. Fish and seafood. Egg whites. Dried beans, peas, or lentils. Unsalted nuts, nut butters, and seeds. Unsalted canned beans. Lean cuts of beef with fat trimmed off. Low-sodium, lean precooked or cured meat, such as sausages or meat loaves. Dairy Low-fat (1%) or fat-free (skim) milk. Reduced-fat, low-fat, or fat-free cheeses. Nonfat, low-sodium ricotta or cottage cheese. Low-fat or nonfat yogurt. Low-fat, low-sodium cheese. Fats and oils Soft margarine without trans fats. Vegetable oil. Reduced-fat, low-fat, or light mayonnaise and salad dressings  (reduced-sodium). Canola, safflower, olive, avocado, soybean, and sunflower oils. Avocado. Seasonings and condiments Herbs. Spices. Seasoning mixes without salt. Other foods Unsalted popcorn and pretzels. Fat-free sweets. The items listed above may not be a complete list of foods and beverages you can eat. Contact a dietitian for more information. What foods should I avoid? Fruits Canned fruit in a light or heavy syrup. Fried fruit. Fruit in cream or butter sauce. Vegetables Creamed or fried vegetables. Vegetables in a cheese sauce. Regular canned vegetables (not low-sodium or reduced-sodium). Regular canned tomato sauce and paste (not low-sodium or reduced-sodium). Regular tomato and vegetable juice (not low-sodium or reduced-sodium). Angie Fava. Olives. Grains Baked goods made with fat, such as croissants, muffins, or some breads. Dry pasta or rice meal packs. Meats and other proteins Fatty cuts of meat. Ribs. Fried meat. Berniece Salines. Bologna, salami, and other precooked or cured meats, such as sausages or meat loaves. Fat from the back of a pig (fatback). Bratwurst. Salted nuts and seeds. Canned beans with added salt. Canned or smoked fish. Whole eggs or egg yolks. Chicken or [place] with skin. Dairy Whole or 2% milk, cream, and half-and-half. Whole or full-fat cream cheese. Whole-fat or sweetened yogurt. Full-fat cheese. Nondairy creamers. Whipped toppings. Processed cheese and cheese spreads. Fats and oils Butter. Stick margarine. Lard. Shortening. Ghee. Bacon fat. Tropical oils, such as coconut, palm kernel, or palm oil. Seasonings and condiments Onion salt, garlic salt, seasoned salt, table salt, and sea salt. Worcestershire sauce. Tartar sauce. Barbecue sauce. Teriyaki sauce. Soy sauce, including reduced-sodium. Steak sauce. Canned and packaged gravies. Fish sauce. Oyster sauce. Cocktail sauce. Store-bought horseradish. Ketchup. Mustard. Meat flavorings and tenderizers. Bouillon cubes. Hot sauces.  Pre-made or packaged marinades. Pre-made or packaged taco seasonings. Relishes. Regular salad dressings. Other foods Salted popcorn and pretzels. The items listed above may not be a complete list of foods and beverages you should avoid. Contact a dietitian for more information. Where to find more information National Heart, Lung, and Blood Institute: https://wilson-eaton.com/ American Heart Association: www.heart.org Academy of Nutrition and Dietetics: www.eatright.[place]: www.kidney.org Summary The DASH eating plan is a healthy eating plan that has been shown to reduce high blood pressure (hypertension). It may also reduce your risk for type 2 diabetes, heart disease, and stroke. When on the DASH eating plan, aim to eat more fresh fruits and vegetables, whole grains, lean proteins, low-fat dairy, and heart-healthy  fats. With the DASH eating plan, you should limit salt (sodium) intake to 2,300 mg a day. If you have hypertension, you may need to reduce your sodium intake to 1,500 mg a day. Work with your health care provider or dietitian to adjust your eating plan to your individual calorie needs. This information is not intended to replace advice given to you by your health care provider. Make sure you discuss any questions you have with your health care provider. Document Revised: 03/21/2019 Document Reviewed: 03/21/2019 Elsevier Patient Education  [place] refers to food and lifestyle choices that are based on the traditions of countries located on the [employer]. It focuses on eating more fruits, vegetables, whole grains, beans, nuts, seeds, and heart-healthy fats, and eating less dairy, meat, eggs, and processed foods with added sugar, salt, and fat. This way of eating has been shown to help prevent certain conditions and improve outcomes for people who have chronic diseases, like kidney disease and heart disease. What  are tips for following this plan? Reading food labels Check the serving size of packaged foods. For foods such as rice and pasta, the serving size refers to the amount of cooked product, not dry. Check the total fat in packaged foods. Avoid foods that have saturated fat or trans fats. Check the ingredient list for added sugars, such as corn syrup. Shopping  Buy a variety of foods that offer a balanced diet, including: Fresh fruits and vegetables (produce). Grains, beans, nuts, and seeds. Some of these may be available in unpackaged forms or large amounts (in bulk). Fresh seafood. Poultry and eggs. Low-fat dairy products. Buy whole ingredients instead of prepackaged foods. Buy fresh fruits and vegetables in-season from local farmers markets. Buy plain frozen fruits and vegetables. If you do not have access to quality fresh seafood, buy precooked frozen shrimp or canned fish, such as tuna, salmon, or sardines. Stock your pantry so you always have certain foods on hand, such as olive oil, canned tuna, canned tomatoes, rice, pasta, and beans. Cooking Cook foods with extra-virgin olive oil instead of using butter or other vegetable oils. Have meat as a side dish, and have vegetables or grains as your main dish. This means having meat in small portions or adding small amounts of meat to foods like pasta or stew. Use beans or vegetables instead of meat in common dishes like chili or lasagna. Experiment with different cooking methods. Try roasting, broiling, steaming, and sauting vegetables. Add frozen vegetables to soups, stews, pasta, or rice. Add nuts or seeds for added healthy fats and plant protein at each meal. You can add these to yogurt, salads, or vegetable dishes. Marinate fish or vegetables using olive oil, lemon juice, garlic, and fresh herbs. Meal planning Plan to eat one vegetarian meal one day each week. Try to work up to two vegetarian meals, if possible. Eat seafood two or more  times a week. Have healthy snacks readily available, such as: Vegetable sticks with hummus. Greek yogurt. Fruit and nut trail mix. Eat balanced meals throughout the week. This includes: Fruit: 2-3 servings a day. Vegetables: 4-5 servings a day. Low-fat dairy: 2 servings a day. Fish, poultry, or lean meat: 1 serving a day. Beans and legumes: 2 or more servings a week. Nuts and seeds: 1-2 servings a day. Whole grains: 6-8 servings a day. Extra-virgin olive oil: 3-4 servings a day. Limit red meat and sweets to only a few servings a month. Lifestyle  Cook and eat meals together with your family, when possible. Drink enough fluid to keep your urine pale yellow. Be physically active every day. This includes: Aerobic exercise like running or swimming. Leisure activities like gardening, walking, or housework. Get 7-8 hours of sleep each night. If recommended by your health care provider, drink red wine in moderation. This means 1 glass a day for nonpregnant women and 2 glasses a day for men. A glass of wine equals 5 oz (150 mL). What foods should I eat? Fruits Apples. Apricots. Avocado. Berries. Bananas. Cherries. Dates. Figs. Grapes. Lemons. Melon. Oranges. Peaches. Plums. Pomegranate. Vegetables Artichokes. Beets. Broccoli. Cabbage. Carrots. Eggplant. Green beans. Chard. Kale. Spinach. Onions. Leeks. Peas. Squash. Tomatoes. Peppers. Radishes. Grains Whole-grain pasta. Brown rice. Bulgur wheat. Polenta. Couscous. Whole-wheat bread. [place]. Meats and other proteins Beans. Almonds. Sunflower seeds. Pine nuts. Peanuts. [place]. Salmon. Scallops. Shrimp. [place]. Tilapia. Clams. Oysters. Eggs. Poultry without skin. Dairy Low-fat milk. Cheese. Greek yogurt. Fats and oils Extra-virgin olive oil. Avocado oil. Grapeseed oil. Beverages Water. Red wine. Herbal tea. Sweets and desserts Greek yogurt with honey. Baked apples. Poached pears. Trail mix. Seasonings and condiments Basil.  Cilantro. Coriander. Cumin. Mint. Parsley. Sage. Rosemary. Tarragon. Garlic. Oregano. Thyme. Pepper. Balsamic vinegar. Tahini. Hummus. Tomato sauce. Olives. Mushrooms. The items listed above may not be a complete list of foods and beverages you can eat. Contact a dietitian for more information. What foods should I limit? This is a list of foods that should be eaten rarely or only on special occasions. Fruits Fruit canned in syrup. Vegetables Deep-fried potatoes (french fries). Grains Prepackaged pasta or rice dishes. Prepackaged cereal with added sugar. Prepackaged snacks with added sugar. Meats and other proteins Beef. Pork. Lamb. Poultry with skin. Hot dogs. [place] Salines. Dairy Ice cream. Sour cream. Whole milk. Fats and oils Butter. Canola oil. Vegetable oil. Beef fat (tallow). Lard. Beverages Juice. Sugar-sweetened soft drinks. Beer. Liquor and spirits. Sweets and desserts Cookies. Cakes. Pies. Candy. Seasonings and condiments Mayonnaise. Pre-made sauces and marinades. The items listed above may not be a complete list of foods and beverages you should limit. Contact a dietitian for more information. Summary The Mediterranean diet includes both food and lifestyle choices. Eat a variety of fresh fruits and vegetables, beans, nuts, seeds, and whole grains. Limit the amount of red meat and sweets that you eat. If recommended by your health care provider, drink red wine in moderation. This means 1 glass a day for nonpregnant women and 2 glasses a day for men. A glass of wine equals 5 oz (150 mL). This information is not intended to replace advice given to you by your health care provider. Make sure you discuss any questions you have with your health care provider. Document Revised: 05/23/2019 Document Reviewed: 03/20/2019 Elsevier Patient Education  [place]         Signed, Emmaline Life, NP  01/20/2022 12:27 PM    [place]

## 2022-01-18 NOTE — Telephone Encounter (Signed)
Covering [facility] preop today, looks like operator received callback generated from 9/13 [facility] phone note. I will document on that note and route to our team.

## 2022-01-20 ENCOUNTER — Encounter: Payer: Self-pay | Admitting: Nurse Practitioner

## 2022-01-20 ENCOUNTER — Ambulatory Visit (INDEPENDENT_AMBULATORY_CARE_PROVIDER_SITE_OTHER): Payer: Medicare Other | Admitting: Internal Medicine

## 2022-01-20 ENCOUNTER — Ambulatory Visit: Payer: Medicare Other | Attending: Nurse Practitioner | Admitting: Nurse Practitioner

## 2022-01-20 ENCOUNTER — Encounter: Payer: Self-pay | Admitting: Internal Medicine

## 2022-01-20 ENCOUNTER — Telehealth: Payer: Self-pay | Admitting: *Deleted

## 2022-01-20 VITALS — BP 158/92 | HR 68 | Ht 62.0 in | Wt 165.0 lb

## 2022-01-20 DIAGNOSIS — E785 Hyperlipidemia, unspecified: Secondary | ICD-10-CM | POA: Diagnosis not present

## 2022-01-20 DIAGNOSIS — Z01810 Encounter for preprocedural cardiovascular examination: Secondary | ICD-10-CM

## 2022-01-20 DIAGNOSIS — J4530 Mild persistent asthma, uncomplicated: Secondary | ICD-10-CM

## 2022-01-20 DIAGNOSIS — R058 Other specified cough: Secondary | ICD-10-CM

## 2022-01-20 DIAGNOSIS — I2510 Atherosclerotic heart disease of native coronary artery without angina pectoris: Secondary | ICD-10-CM | POA: Diagnosis not present

## 2022-01-20 DIAGNOSIS — N1831 Chronic kidney disease, stage 3a: Secondary | ICD-10-CM

## 2022-01-20 DIAGNOSIS — I10 Essential (primary) hypertension: Secondary | ICD-10-CM | POA: Diagnosis not present

## 2022-01-20 MED ORDER — POTASSIUM CHLORIDE CRYS ER 10 MEQ PO TBCR: 10.0000 meq | EXTENDED_RELEASE_TABLET | Freq: Every day | ORAL | 11 refills | Status: DC

## 2022-01-20 MED ORDER — CHLORTHALIDONE 25 MG PO TABS: 12.5000 mg | ORAL_TABLET | Freq: Every day | ORAL | 11 refills | Status: DC

## 2022-01-20 NOTE — Telephone Encounter (Signed)
Check on Monday for pts labs with [facility], pt was getting labs at [facility] same day.

## 2022-01-20 NOTE — Patient Instructions (Signed)
No change in medications  You are cleared for surgery   Please schedule a follow up visit in 6 months but call sooner if needed

## 2022-01-20 NOTE — Assessment & Plan Note (Signed)
Onset of symptoms reported  2012 p quit smoking  2005  - FEN0 01/24/2016 = 13 with active symptoms on symb 160 2bid > changed to dulera 100  - Spirometry 02/23/2016  NO signifiant obstruction with active symptoms  - 02/23/2016  After extensive coaching HFA effectiveness =    75%  - FENO 02/23/2016  =  12   On dulera 100 2bid (unable to confirm adeherence as did not bring it - Singulair 10 mg daily added 02/23/2016 to dulera 100 2bid with 2 week sample only  (unable to verify she used it) - Allergy profile 02/23/16  >  Eos 0.0 /  IgE  87 pos  RAST  Grass/ trees  - 04/07/2016   symb 80 x one sample given - FENO 04/07/2016  =   43 ? Really on meds ?  - 05/08/2016  After extensive coaching HFA effectiveness =    75% > continue symb 80 2bid - 07/17/2016 added singulair maint   - FENO 09/18/2016  =   15 on symb 160 2bid > try 80 2bid  - flare late feb 2019 ? What maint rx she was taking but on admit "non-adherent per notes 08/12/17  - 09/18/2017  After extensive coaching inhaler device  effectiveness =    75% from a baseline of 50% > restart symb 80 2bid - FENO 09/18/2017  =   20 on symb 160  - Spirometry 09/18/2017  No obstruction at all with active wheeze -  09/09/2019 added prednisone as "plan D"  - 09/08/2020  After extensive coaching inhaler device,  effectiveness =    50% from baseline of 25% (delayed breath in)    06/23/21 Ent eval c/w severe edema from GERD    Dramatic improvement with rx for gerd and speech therapy and now just on bud /formoterol once per day with option to go to bid for flare  She is cleared for Knee surgery/ f/u can be q 6 m sooner prn         Each maintenance medication was reviewed in detail including emphasizing most importantly the difference between maintenance and prns and under what circumstances the prns are to be triggered using an action plan format where appropriate.  Total time for H and P, chart review, counseling, reviewing hfa/neb device(s) and generating  customized AVS unique to this office visit / same day charting = 25 min

## 2022-01-20 NOTE — Assessment & Plan Note (Signed)
-   CT sinus 08/06/14 > neg  - Allergy profile 02/23/16  >  Eos 0.0 /  IgE  87 pos  RAST  Grass/ trees  - improved 05/08/2016 on gabapentin 300 tid > no change rx > d/c'd 07/2016 DgEs 10/09/17  Low range of penetration without aspiration. Otherwise, normal - Asthma exac 12/17/17- FENO 18. Significant upper airway wheeze, tx pred taper. Add neurontin '100mg'$  TID.  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 > rec max  gerd rx   - 04/05/2018 rechallenge with gabapentin 300  Qid  - PFT's  04/18/2018  FEV1 1.13 (68 % ) ratio 91  p 7 % improvement from saba p nothing prior to study with DLCO  53/60 % corrects to 147  % for alv volume  s curvature - 02/04/2019 informed only using gabapentin 300 mg hs so ok to d/c  06/23/21  Onset of symptoms reported  2012 p quit smoking  2005  - FEN0 01/24/2016 = 13 with active symptoms on symb 160 2bid > changed to dulera 100  - Spirometry 02/23/2016  NO signifiant obstruction with active symptoms  - 02/23/2016  After extensive coaching HFA effectiveness =    75%  - FENO 02/23/2016  =  12   On dulera 100 2bid (unable to confirm adeherence as did not bring it - Singulair 10 mg daily added 02/23/2016 to dulera 100 2bid with 2 week sample only  (unable to verify she used it) - Allergy profile 02/23/16  >  Eos 0.0 /  IgE  87 pos  RAST  Grass/ trees  - 04/07/2016   symb 80 x one sample given - FENO 04/07/2016  =   43 ? Really on meds ?  - 05/08/2016  After extensive coaching HFA effectiveness =    75% > continue symb 80 2bid - 07/17/2016 added singulair maint   - FENO 09/18/2016  =   15 on symb 160 2bid > try 80 2bid  - flare late feb  2019 ? What maint rx she was taking but on admit "non-adherent per notes 08/12/17  - 09/18/2017  After extensive coaching inhaler device  effectiveness =    75% from a baseline of 50% > restart symb 80 2bid - FENO 09/18/2017  =   20 on symb 160  - Spirometry 09/18/2017  No obstruction at all with active wheeze -  09/09/2019 added prednisone as "plan D"  - 09/08/2020  After extensive coaching inhaler device,  effectiveness =    50% from baseline of 25% (delayed breath in)    06/23/21 Ent eval c/w severe edema from GERD 08/30/20 Dx EG Trace gastroesophageal reflux without evidence of stricture or ulceration. Refer to [facility] 06/29/2021 >>> much better 01/20/2022

## 2022-01-20 NOTE — Progress Notes (Signed)
Subjective:   Patient ID: [name], female    DOB: 07-26-1950    MRN: [id number]    Brief patient profile:  71   yobf quit smoking 2005 with nl pfts 2012  with recurrent non-specific resp flares with nl pfts during flares c/w pseudoasthma.     History of Present Illness  01/24/2016  Extended post hosp f/u ov/ transition of care/ /Dorris Vangorder re: dtca asthma/ vcd symb 160 and pred 5 x 10 mg daily  Chief Complaint  Patient presents with   Follow-up    Pt. recently got [facility] the [facility] for an asthma attack, Pt. states her breathing remains the same, coughing with some yellow to white mucus,Has been needing to use Ventolin more often  last doing well 2 months prior to OV   While on Symb 160/ gerd rx but not dosing ac as rec  Waking up prematurely x 2 months with cough/wheeze/ sob but never contacted me as per the instructions rec Plan A = Automatic = dulera 100 Take 2 puffs first thing in am and then another 2 puffs about 12 hours later.                                      Nexum 40 mg Take 30-60 min before first meal of the day and Pepcid AC 20 mg  at bedtime Work on inhaler technique  Plan B = Backup Only use your albuterol as a rescue medication Plan C = Crisis - only use your albuterol nebulizer if you first try Plan B and it fails to help > ok to use the nebulizer up to every 4 hours but if start needing it regularly call for immediate appointment GERD diet  Take delsym two tsp every 12 hours and use the flutter valve as much as possible and supplement if needed with  tramadol 50 mg up to 2 every 4 hours to suppress the urge to cough  Once you have eliminated the cough for 3 straight days try reducing the tramadol first,  then the delsym as tolerated.   Prednisone 5 mg x 8, then 6, then 4 then 2, then 1 daily x 2 days and off  please schedule a follow up office visit in 2 weeks, sooner if needed with all meds/ inhalers/ neb solutions in hand     09/18/2016  f/u ov/Lissandro Dilorenzo re:  UACS vs  Asthma brought med calendar but not updated / on symb 160 / max gerd rx  Chief Complaint  Patient presents with   Follow-up    Pt c/o occ wheezing. She has not used rescue inhaler or neb recently.   still feels wheezing  And doe = MMRC1 = can walk nl pace, flat grade, can't hurry or go uphills or steps s sob rec Change the symbicort to 80 Take 2 puffs first thing in am and then another 2 puffs about 12 hours later.  Work on inhaler technique:  Use the med calendar daily to organize your medications as a "checklist" See [name] NP in 4  weeks with all your medications> never happened    Admit date: 12/19/2017 Discharge date: 12/24/2017    D/c home with steroids taper, monitor blood glucose while on steroids She reports run out of pulmicort nebs, new prescription provided, she is to follow up with Dr Melvyn Novas to further discuss asthma/copd meds, she is concerned about cost of these  meds   Discharge Diagnoses:      Active Hospital Problems    Diagnosis Date Noted   Acute bronchitis with asthma with acute exacerbation 01/16/2016   Acute renal failure superimposed on stage 3 chronic kidney disease (HCC) 07/17/2016   Chronic diastolic CHF (congestive heart failure) ([place]) 07/17/2016   Acute respiratory failure with hypoxia (HCC)     Upper airway cough syndrome vs Asthma  08/28/2014   Diabetes mellitus type 2, controlled ([place]) 08/06/2014   Essential hypertension 07/21/2008            12/28/2017 ext post hosp f/u ov/Trestan Vahle re: confused with meds again / only slightly better breathing  Chief Complaint  Patient presents with   Follow-up    Breathing has improved slightly. She has been out of brovana due to high cost x 3 days. She has had to use her albuterol inhaler and neb daily since then.   Dyspnea:  50 ft = About the same as usual Cough: after supper is the worse, but not much mucus Sleeping: wakes up coughing 30 degrees due to sensation of reflux    SABA use: last 5 h prior to OV   02:  none  Sleeping at 30 degrees    rec Plan A = Automatic = Performist 20 mcg one vial  with pulmocort one half vial twice daily  Gabapentin 100 mg three times daily Nexium Take 30- 60 min before your first and last meals of the day and zantac (ranitidine) at bedtime  Along with chlorpheniramine 4 mg 1 or 2 also at bedtime (over the counter)  Continue prednisone 10 mg every day  Plan B = Backup for breathing Only use your albuterol as a rescue medication  Plan B = Backup for coughing  Add tramadol 50 mg 1-2 every 4 hours if needed  Plan C = Crisis - only use your albuterol nebulizer if you first try Plan B and it fails to help > ok to use the nebulizer up to every 4 hours but if start needing it regularly call for immediate appointment     03/11/2018  f/u ov/Arcenia Scarbro re: pseudoasthma > asthma Chief Complaint  Patient presents with   Follow-up    Cough had improved and then started back 1 day ago.  Cough is now non prod.  She is using her albuterol inhaler 2 x daily on average. She has not needed albuterol neb.    Dyspnea:  MMRC2 = can't walk a nl pace on a flat grade s sob but does fine slow and flat slowed by knee  Cough: noct > day  Sleeping: 45 degrees due to subjective reflux despite max acid suppresion  SABA use: as above - never noct 02: none   rec Gabapentin 100 mg four  times daily  Take chortab 4 mg x 2 at bedtime and up to every 4 hours during the day as needed for drainage  Take delsym two tsp every 12 hours and supplement if needed with  tramadol 50 mg up to 2 every 4 hours to suppress the urge to cough. Swallowing water and/or using ice chips/non mint and menthol containing candies (such as lifesavers or sugarless jolly ranchers) are also effective.  You should rest your voice and avoid activities that you know make you cough. Once you have eliminated the cough for 3 straight days try reducing the tramadol first,  then the delsym as tolerated.  Plan D = Prednisone x 6 days if  not cough/ sob  not better See calendar for specific medication instructions and bring it back for each and every office visit for every healthcare provider you see.  Without it,  you may not receive the best quality medical care that we feel you deserve. Please schedule a follow up office visit in 4 weeks, call sooner if needed with all medications /inhalers/ solutions in hand so we can verify exactly what you are taking. This includes all medications from all doctors and over the [place] separate them into two bags:  the ones you take automatically, no matter what, vs the ones you take just when you feel you need them "BAG #2 is UP TO YOU"  - this will really help [place] help you take your medications more effectively.     03/15/18 back to ER - did not activate action plan prior (never took more than 1 tramadol every 12 hours to control cough.   03/20/2018  Extended post hops  f/u ov/Alayziah Tangeman re: transition of care re   vcd vs asthma  Chief Complaint  Patient presents with   Follow-up    recent admission 03/15/18- pt reports of wheezing, sob with exertion, no prod cough & chest tightness.   Dyspnea:  MMRC4  = sob if tries to leave home or while getting dressed   - no better now vs during hosp stay. Seen by Dr Lake Bells who strongly supported vcd dx during admit Cough: dry cough settles down overnight and with plb as does the "wheeze"  Sleeping: 45 degrees SABA use: last neb 8 h prior to OV  "did not help" 02: no  rec Increase gabapentin to 100 mg take 2 (=200)  four times daily as per med calendar For cough > delsym 2 tsp every 12 hours with use of flutter valve as much as possible  and supplement with tramadol 50 mg up to 1-2 every 4 hours as needed  For breathing problems not improving with use of nebulizer > Prednisone 10 mg take  4 each am x 2 days,   2 each am x 2 days,  1 each am x 2 days and stop  GERD diet / lifestyle recs Keep previous appt with your medications in 2 bags     02/04/19 televisit Stop gabapentin completely since you are only taking one at bedtime but add it back if start coughing again for any reason and ok to build up to 4 x daily  If your breathing gets worse, increase the Performist to 20 mcg (one vial) every 12 hours instead of just once a day.    01/12/2020  f/u ov/Aime Meloche re:   Asthma with uacs / continues to struggle with concept of med reconciliation Chief Complaint  Patient presents with   Follow-up    "feeling good"  Dyspnea:  Walking neighborhood, chasing kids at age 41 and 33 Cough: none / still hoarse and clearing throat daytime  Sleeping : no resp symptoms  02 none  rec Plan A = Automatic = Always=    Performist and the budesonide twice daily  And continue esmeprazole 40 mg Take 30-60 min before first meal of the day and 20 mg Take 30-60 min before last meal of the day  Plan B = Backup (to supplement plan A, not to replace it) Only use your albuterol inhaler as a rescue medication Plan C = Crisis (instead of Plan B but only if Plan B stops working) - only use your albuterol nebulizer if you first try Plan  B  Plan D = Deltasone if ABC not working  - Prednisone 10 mg take  4 each am x 2 days,   2 each am x 2 days,  1 each am x 2 days and stop     09/08/2020  f/u ov/Maizie Garno re: AB/ uacs much better on performist/bud with still poor hfa  Chief Complaint  Patient presents with   Follow-up    Pt states she has been doing well since last visit. States her cough is gone and denies any current complaints.   Dyspnea:  No limits  Cough: none  Sleeping: ok flat bed/ 2 pillows  SABA use: none  02: none Covid status:   vax x 3  Rec Ok to reduce your perforomist / budesonide once daily as long as doing great - otherwise twice daily  Esomeprazole is Take 30- 60 min before your first and last meals of the day     03/15/2021  f/u ov/Minh Jasper re: AB  maint on perfomist/bud/ singulair each am   Chief Complaint  Patient presents with    Follow-up    Lumpectomy 9/22.  Breathing is ok.  Dyspnea:  Not limited by breathing from desired activities  daily walks thru neighborhood x 30 min some hills Cough: none  Sleeping: on either side flat bed 2 pillows  SABA use: with cold  02: none  Covid status:   vax x 4   Rec Admit date:     05/13/2021  Discharge date: 05/14/21    PCP: [name], MD     Pt needs ENT referral   Discharge Diagnoses Principal Problem:   Hypomagnesemia   Chronic diastolic CHF (congestive heart failure) (HCC)   Severe persistent asthma without complication   Chronic kidney disease, stage 3a ([place])   CAD in native artery   Diabetes mellitus type 2, controlled ([place])   Essential hypertension   G E R D   Depression with anxiety   Hyperlipidemia   Vocal cord dysfunction   Hospital Course    * Hypomagnesemia- (present on admission) Presents with complaints of muscle cramps, body aches as well as muscle spasms  x few days.PTA Lab work at the PCPs office showed hypomagnesemia. Patient received 4 g of IV magnesium in the ER. No event on telemetry. Potassium and magnesium level stable.  We will continue supplements on discharge.   Severe persistent asthma without complication- (present on admission) Patient denies having any wheezing when she went to her PCPs office. Possibly that her wheezing actually started getting worse on 1/13 only. Continue nebulizer therapy, Brovana and Pulmicort.  As needed DuoNebs.  I do not think the patient requires steroids for now.     Chronic diastolic CHF (congestive heart failure) ([place])- (present on admission) Volume status adequate. Does not appear to be volume overloaded.  Weight actually down 15 pound after discharge from the hospital.     Chronic kidney disease, stage 3a ([place])- (present on admission) Renal function stable close to baseline of 1.16. Monitor.   CAD in native artery- (present on admission) Myoview in 2022 negative. Monitor for now.      Diabetes mellitus type 2, controlled ([place]) Hemoglobin A1c 1.8. On metformin at home   Essential hypertension- (present on admission) Blood pressure stable. Continue home regimen.   G E R D- (present on admission) Continue PPI.   Depression with anxiety- (present on admission) Continue Xanax home regimen.   Hyperlipidemia- (present on admission) On Lipitor at home. Continue.   Vocal  cord dysfunction- (present on admission) Suspecting that is responsible for patient's upper airway wheezing. Recommend ENT follow-up and referral outpatient.       05/17/2021 post hosp  f/u ov/Donnamarie Shankles re: asthma/ vcd   maint on yup/performist/ singulair   Chief Complaint  Patient presents with   Follow-up    She states hospitalized in Dec 2022 with asthma flare- breathing has not returned to baseline but is some better. She uses her albuterol inhaler approx 4 x per wk and rarely uses duoneb.   Dyspnea:  50 ft then sob  Cough: gone / never had st but hoarseness better  Sleeping: waking up due to cough/ flat bed 2 pillows  SABA use: hfa not neb  Rec Bed blocks (risers) 6-8 in under head of bed Only use your albuterol as a rescue medication  Work on inhaler technique:  If not better change to over albuterol (not duoneb) nebulizer up to every 4 hours as needed   and go ahead and take Prednisone 10 mg take  4 each am x 2 days,   2 each am x 2 days,  1 each am x 2 days and stop (refillable)  Change nexium 40 mg Take 30- 60 min before your first and last meals of the day     06/28/2021 post hosp  f/u ov/Aliahna Statzer re: vcd/ asthma    maint on bud/ performist   Chief Complaint  Patient presents with   Follow-up    Follow up. Patient says her chest still hurts and she's still wheezing.   Dyspnea:  50 ft  Cough: severe slt yellowish / harsh hacking cough  Sleeping: hob 2 blocks  SABA use: confused again re maint vs prn  02: none  Ent eval c/w severe edema from GERD  06/23/21  Rec I am referring you to  Garald Balding, speech terapist for your VCD Pantoprazole is 40 mg Take 30- 60 min before your first and last meals of the day and famotidine 20 mg before bedtime  Plan A = Automatic = Always=   Budesonide  0.5 mg each am with formoterol 20 mcg and repeat the formoterol in 12 hours  Plan B = Backup (to supplement plan A, not to replace it) Only use your albuterol  as a rescue medication  Zpak  Finish prednisone  Keep the candy handy For cough > mucinex dm 1200 mg every 12 hours and  if still coughing > tylenol #3 one every 4 hours as needed and use the flutter valve as much as you can Please schedule a follow up office visit in 4 weeks, sooner if needed    01/20/2022  f/u ov/Ellan Tess re: vcd/ asthma maint on performist/bud each am  and skips pm / no recent predniosne Chief Complaint  Patient presents with   Follow-up    She is doing well and wants to talk about her surgery.    Dyspnea:  L knee limiting  Cough: none / Speech therapy helped  Sleeping: flat bed 2 pillows no problem  SABA use: none  02: none  Covid status:   all vax / infected  twice    No obvious day to day or daytime variability or assoc excess/ purulent sputum or mucus plugs or hemoptysis or cp or chest tightness, subjective wheeze or overt sinus or hb symptoms.   sleeping without nocturnal  or early am exacerbation  of respiratory  c/o's or need for noct saba. Also denies any obvious fluctuation of symptoms with weather  or environmental changes or other aggravating or alleviating factors except as outlined above   No unusual exposure hx or h/o childhood pna/ asthma or knowledge of premature birth.  Current Allergies, Complete Past Medical History, Past Surgical History, Family History, and Social History were reviewed in [employer] record.  ROS  The following are not active complaints unless bolded Hoarseness, sore throat, dysphagia, dental problems, itching, sneezing,  nasal congestion or discharge  of excess mucus or purulent secretions, ear ache,   fever, chills, sweats, unintended wt loss or wt gain, classically pleuritic or exertional cp,  orthopnea pnd or arm/hand swelling  or leg swelling L>R , presyncope, palpitations, abdominal pain, anorexia, nausea, vomiting, diarrhea  or change in bowel habits or change in bladder habits, change in stools or change in urine, dysuria, hematuria,  rash, arthralgias, visual complaints, headache, numbness, weakness or ataxia or problems with walking or coordination,  change in mood or  memory.        Current Meds  Medication Sig   albuterol (PROAIR HFA) 108 (90 Base) MCG/ACT inhaler Inhale 2 puffs into the lungs every 4 (four) hours as needed for wheezing or shortness of breath.   albuterol (PROVENTIL) (2.5 MG/3ML) 0.083% nebulizer solution Take 3 mLs (2.5 mg total) by nebulization every 4 (four) hours as needed for wheezing or shortness of breath.   ALPRAZolam (XANAX) 0.5 MG tablet Take 1 tablet (0.5 mg total) by mouth 2 (two) times daily as needed for anxiety. (Patient taking differently: Take 0.5 mg by mouth See admin instructions. Take 0.5 mg by mouth at bedtime and an additional 0.5 mg once a day as needed for anxiety)   atorvastatin (LIPITOR) 40 MG tablet Take 40 mg by mouth at bedtime.   Biotin 5000 MCG CAPS Take 5,000 mcg by mouth daily.   budesonide (PULMICORT) 0.5 MG/2ML nebulizer solution USE 1 VIAL IN NEBULIZER IN THE MORNING AND AT BEDTIME (Patient taking differently: Take 0.5 mg by nebulization in the morning and at bedtime.)   chlorthalidone (HYGROTON) 25 MG tablet Take 0.5 tablets (12.5 mg total) by mouth daily.   diltiazem (CARDIZEM CD) 360 MG 24 hr capsule Take 360 mg by mouth daily.   divalproex (DEPAKOTE ER) 500 MG 24 hr tablet TAKE 1 TABLET BY MOUTH AT  BEDTIME (Patient taking differently: Take 500 mg by mouth at bedtime.)   ferrous sulfate 325 (65 FE) MG tablet Take 325 mg by mouth daily with breakfast.   fluticasone (FLONASE) 50  MCG/ACT nasal spray Place 2 sprays into both nostrils daily as needed for allergies.   formoterol (PERFOROMIST) 20 MCG/2ML nebulizer solution USE 1 VIAL  IN  NEBULIZER TWICE  DAILY - morning and evening (Patient taking differently: Take 20 mcg by nebulization in the morning and at bedtime.)   glipiZIDE (GLUCOTROL) 5 MG tablet Take 2 tablets (10 mg total) by mouth daily before breakfast for 5 days, THEN 1 tablet (5 mg total) daily before breakfast.   hydrALAZINE (APRESOLINE) 100 MG tablet TAKE 1 TABLET BY MOUTH 3 TIMES  DAILY (Patient taking differently: Take 100 mg by mouth 3 (three) times daily.)   latanoprost (XALATAN) 0.005 % ophthalmic solution Place 1 drop into both eyes at bedtime.   magnesium oxide (MAG-OX) 400 (240 Mg) MG tablet Take 1 tablet (400 mg total) by mouth daily.   memantine (NAMENDA) 10 MG tablet TAKE 1 TABLET BY MOUTH TWICE  DAILY   metFORMIN (GLUCOPHAGE) 1000 MG tablet Take 1,000 mg by mouth 2 (two)  times daily with a meal.   montelukast (SINGULAIR) 10 MG tablet Take 1 tablet (10 mg total) by mouth at bedtime.   Multiple Vitamins-Minerals (ONE-A-DAY WOMENS PO) Take 1 tablet by mouth daily with breakfast.   nebivolol (BYSTOLIC) 10 MG tablet Take 1 tablet (10 mg total) by mouth daily. (Patient taking differently: Take 10 mg by mouth 2 (two) times daily.)   nitroGLYCERIN (NITROSTAT) 0.4 MG SL tablet Place 1 tablet (0.4 mg total) under the tongue every 5 (five) minutes as needed for chest pain. Don't exceed more than 3 doses in 15 mins   pantoprazole (PROTONIX) 40 MG tablet Take 1 tablet (40 mg total) by mouth 2 (two) times daily before a meal.   polyethylene glycol (MIRALAX / GLYCOLAX) 17 g packet Take 17 g by mouth daily as needed.   potassium chloride (KLOR-CON M) 10 MEQ tablet Take 1 tablet (10 mEq total) by mouth daily.   Probiotic Product (EQ PROBIOTIC) CAPS Take 1 capsule by mouth every morning.                Objective:   Physical Exam  Wt  01/20/2022      165   06/28/2021      173  05/17/2021      165 03/15/2021    161  09/08/2020      153 01/12/2020      150  09/09/2019     147  03/12/2019   157  11/04/2018       157  02/23/2016   168 >  04/07/2016  168 > 05/08/2016    168 >  07/17/2016  162 > 09/18/2016    164  > 09/18/2017  160 > 12/28/2017 167  > 03/11/2018  164 > 03/20/2018   160 > 04/05/2018   171 > 04/18/2018  165     Vital signs reviewed  01/20/2022  - Note at rest 02 sats  98% on RA   General appearance:    amb bf now with minimal throat clearing and no pseudowheeze   HEENT : Oropharynx  clear      Nasal turbinates nl    NECK :  without  apparent JVD/ palpable Nodes/TM    LUNGS: no acc muscle use,  Nl contour chest which is clear to A and P bilaterally without cough on insp or exp maneuvers   CV:  RRR  no s3 or murmur or increase in P2, and 1+ ptting on L, trace on R   ABD:  soft and nontender with nl inspiratory excursion in the supine position. No bruits or organomegaly appreciated   MS:  Nl gait/ ext warm without deformities Or obvious joint restrictions  calf tenderness, cyanosis or clubbing    SKIN: warm and dry without lesions    NEURO:  alert, approp, nl sensorium with  no motor or cerebellar deficits apparent.

## 2022-01-20 NOTE — Patient Instructions (Signed)
Medication Instructions:   START Chlorthalidone one half (0.5 mg) tablet by mouth (12.5 mg) daily.   START K-dur one (1) tablet by mouth  (10 mEq) daily.   *If you need a refill on your cardiac medications before your next appointment, please call your pharmacy*   Lab Work:  None ordered.  If you have labs (blood work) drawn today and your tests are completely normal, you will receive your results only by: [place] (if you have MyChart) OR A paper copy in the mail If you have any lab test that is abnormal or we need to change your treatment, we will call you to review the results.   Testing/Procedures:  None ordered.   Follow-Up: At [facility], you and your health needs are our priority.  As part of our continuing mission to provide you with exceptional heart care, we have created designated Provider Care Teams.  These Care Teams include your primary Cardiologist (physician) and Advanced Practice Providers (APPs -  Physician Assistants and Nurse Practitioners) who all work together to provide you with the care you need, when you need it.  We recommend signing up for the patient portal called "MyChart".  Sign up information is provided on this After Visit Summary.  MyChart is used to connect with patients for Virtual Visits (Telemedicine).  Patients are able to view lab/test results, encounter notes, upcoming appointments, etc.  Non-urgent messages can be sent to your provider as well.   To learn more about what you can do with MyChart, go to [email or web address].    Your next appointment:   1 week(s)  The format for your next appointment:   In Person  Provider:   Christen Bame, NP         Other Instructions  DASH Eating Plan DASH stands for Dietary Approaches to Stop Hypertension. The DASH eating plan is a healthy eating plan that has been shown to: Reduce high blood pressure (hypertension). Reduce your risk for type 2 diabetes, heart disease, and  stroke. Help with weight loss. What are tips for following this plan? Reading food labels Check food labels for the amount of salt (sodium) per serving. Choose foods with less than 5 percent of the Daily Value of sodium. Generally, foods with less than 300 milligrams (mg) of sodium per serving fit into this eating plan. To find whole grains, look for the word "whole" as the first word in the ingredient list. Shopping Buy products labeled as "low-sodium" or "no salt added." Buy fresh foods. Avoid canned foods and pre-made or frozen meals. Cooking Avoid adding salt when cooking. Use salt-free seasonings or herbs instead of table salt or sea salt. Check with your health care provider or pharmacist before using salt substitutes. Do not fry foods. Cook foods using healthy methods such as baking, boiling, grilling, roasting, and broiling instead. Cook with heart-healthy oils, such as olive, canola, avocado, soybean, or sunflower oil. Meal planning  Eat a balanced diet that includes: 4 or more servings of fruits and 4 or more servings of vegetables each day. Try to fill one-half of your plate with fruits and vegetables. 6-8 servings of whole grains each day. Less than 6 oz (170 g) of lean meat, poultry, or fish each day. A 3-oz (85-g) serving of meat is about the same size as a deck of cards. One egg equals 1 oz (28 g). 2-3 servings of low-fat dairy each day. One serving is 1 cup (237 mL). 1 serving of nuts, seeds, or  beans 5 times each week. 2-3 servings of heart-healthy fats. Healthy fats called omega-3 fatty acids are found in foods such as walnuts, flaxseeds, fortified milks, and eggs. These fats are also found in cold-water fish, such as sardines, salmon, and mackerel. Limit how much you eat of: Canned or prepackaged foods. Food that is high in trans fat, such as some fried foods. Food that is high in saturated fat, such as fatty meat. Desserts and other sweets, sugary drinks, and other foods  with added sugar. Full-fat dairy products. Do not salt foods before eating. Do not eat more than 4 egg yolks a week. Try to eat at least 2 vegetarian meals a week. Eat more home-cooked food and less restaurant, buffet, and fast food. Lifestyle When eating at a restaurant, ask that your food be prepared with less salt or no salt, if possible. If you drink alcohol: Limit how much you use to: 0-1 drink a day for women who are not pregnant. 0-2 drinks a day for men. Be aware of how much alcohol is in your drink. In the [place] one drink equals one 12 oz bottle of beer (355 mL), one 5 oz glass of wine (148 mL), or one 1 oz glass of hard liquor (44 mL). General information Avoid eating more than 2,300 mg of salt a day. If you have hypertension, you may need to reduce your sodium intake to 1,500 mg a day. Work with your health care provider to maintain a healthy body weight or to lose weight. Ask what an ideal weight is for you. Get at least 30 minutes of exercise that causes your heart to beat faster (aerobic exercise) most days of the week. Activities may include walking, swimming, or biking. Work with your health care provider or dietitian to adjust your eating plan to your individual calorie needs. What foods should I eat? Fruits All fresh, dried, or frozen fruit. Canned fruit in natural juice (without added sugar). Vegetables Fresh or frozen vegetables (raw, steamed, roasted, or grilled). Low-sodium or reduced-sodium tomato and vegetable juice. Low-sodium or reduced-sodium tomato sauce and tomato paste. Low-sodium or reduced-sodium canned vegetables. Grains Whole-grain or whole-wheat bread. Whole-grain or whole-wheat pasta. Brown rice. [place]. Bulgur. Whole-grain and low-sodium cereals. Pita bread. Low-fat, low-sodium crackers. Whole-wheat flour tortillas. Meats and other proteins Skinless chicken or [place]. Ground chicken or [place]. Pork with fat trimmed off. Fish and seafood. Egg  whites. Dried beans, peas, or lentils. Unsalted nuts, nut butters, and seeds. Unsalted canned beans. Lean cuts of beef with fat trimmed off. Low-sodium, lean precooked or cured meat, such as sausages or meat loaves. Dairy Low-fat (1%) or fat-free (skim) milk. Reduced-fat, low-fat, or fat-free cheeses. Nonfat, low-sodium ricotta or cottage cheese. Low-fat or nonfat yogurt. Low-fat, low-sodium cheese. Fats and oils Soft margarine without trans fats. Vegetable oil. Reduced-fat, low-fat, or light mayonnaise and salad dressings (reduced-sodium). Canola, safflower, olive, avocado, soybean, and sunflower oils. Avocado. Seasonings and condiments Herbs. Spices. Seasoning mixes without salt. Other foods Unsalted popcorn and pretzels. Fat-free sweets. The items listed above may not be a complete list of foods and beverages you can eat. Contact a dietitian for more information. What foods should I avoid? Fruits Canned fruit in a light or heavy syrup. Fried fruit. Fruit in cream or butter sauce. Vegetables Creamed or fried vegetables. Vegetables in a cheese sauce. Regular canned vegetables (not low-sodium or reduced-sodium). Regular canned tomato sauce and paste (not low-sodium or reduced-sodium). Regular tomato and vegetable juice (not low-sodium or reduced-sodium). [place]ie Fava.  Olives. Grains Baked goods made with fat, such as croissants, muffins, or some breads. Dry pasta or rice meal packs. Meats and other proteins Fatty cuts of meat. Ribs. Fried meat. Berniece Salines. Bologna, salami, and other precooked or cured meats, such as sausages or meat loaves. Fat from the back of a pig (fatback). Bratwurst. Salted nuts and seeds. Canned beans with added salt. Canned or smoked fish. Whole eggs or egg yolks. Chicken or [place] with skin. Dairy Whole or 2% milk, cream, and half-and-half. Whole or full-fat cream cheese. Whole-fat or sweetened yogurt. Full-fat cheese. Nondairy creamers. Whipped toppings. Processed cheese and  cheese spreads. Fats and oils Butter. Stick margarine. Lard. Shortening. Ghee. Bacon fat. Tropical oils, such as coconut, palm kernel, or palm oil. Seasonings and condiments Onion salt, garlic salt, seasoned salt, table salt, and sea salt. Worcestershire sauce. Tartar sauce. Barbecue sauce. Teriyaki sauce. Soy sauce, including reduced-sodium. Steak sauce. Canned and packaged gravies. Fish sauce. Oyster sauce. Cocktail sauce. Store-bought horseradish. Ketchup. Mustard. Meat flavorings and tenderizers. Bouillon cubes. Hot sauces. Pre-made or packaged marinades. Pre-made or packaged taco seasonings. Relishes. Regular salad dressings. Other foods Salted popcorn and pretzels. The items listed above may not be a complete list of foods and beverages you should avoid. Contact a dietitian for more information. Where to find more information National Heart, Lung, and Blood Institute: https://wilson-eaton.com/ American Heart Association: www.heart.org Academy of Nutrition and Dietetics: www.eatright.[place]: www.kidney.org Summary The DASH eating plan is a healthy eating plan that has been shown to reduce high blood pressure (hypertension). It may also reduce your risk for type 2 diabetes, heart disease, and stroke. When on the DASH eating plan, aim to eat more fresh fruits and vegetables, whole grains, lean proteins, low-fat dairy, and heart-healthy fats. With the DASH eating plan, you should limit salt (sodium) intake to 2,300 mg a day. If you have hypertension, you may need to reduce your sodium intake to 1,500 mg a day. Work with your health care provider or dietitian to adjust your eating plan to your individual calorie needs. This information is not intended to replace advice given to you by your health care provider. Make sure you discuss any questions you have with your health care provider. Document Revised: 03/21/2019 Document Reviewed: 03/21/2019 Elsevier Patient Education  DISH refers to food and lifestyle choices that are based on the traditions of countries located on the [employer]. It focuses on eating more fruits, vegetables, whole grains, beans, nuts, seeds, and heart-healthy fats, and eating less dairy, meat, eggs, and processed foods with added sugar, salt, and fat. This way of eating has been shown to help prevent certain conditions and improve outcomes for people who have chronic diseases, like kidney disease and heart disease. What are tips for following this plan? Reading food labels Check the serving size of packaged foods. For foods such as rice and pasta, the serving size refers to the amount of cooked product, not dry. Check the total fat in packaged foods. Avoid foods that have saturated fat or trans fats. Check the ingredient list for added sugars, such as corn syrup. Shopping  Buy a variety of foods that offer a balanced diet, including: Fresh fruits and vegetables (produce). Grains, beans, nuts, and seeds. Some of these may be available in unpackaged forms or large amounts (in bulk). Fresh seafood. Poultry and eggs. Low-fat dairy products. Buy whole ingredients instead of prepackaged foods. Buy fresh fruits and vegetables in-season  from local farmers markets. Buy plain frozen fruits and vegetables. If you do not have access to quality fresh seafood, buy precooked frozen shrimp or canned fish, such as tuna, salmon, or sardines. Stock your pantry so you always have certain foods on hand, such as olive oil, canned tuna, canned tomatoes, rice, pasta, and beans. Cooking Cook foods with extra-virgin olive oil instead of using butter or other vegetable oils. Have meat as a side dish, and have vegetables or grains as your main dish. This means having meat in small portions or adding small amounts of meat to foods like pasta or stew. Use beans or vegetables instead of meat in common dishes  like chili or lasagna. Experiment with different cooking methods. Try roasting, broiling, steaming, and sauting vegetables. Add frozen vegetables to soups, stews, pasta, or rice. Add nuts or seeds for added healthy fats and plant protein at each meal. You can add these to yogurt, salads, or vegetable dishes. Marinate fish or vegetables using olive oil, lemon juice, garlic, and fresh herbs. Meal planning Plan to eat one vegetarian meal one day each week. Try to work up to two vegetarian meals, if possible. Eat seafood two or more times a week. Have healthy snacks readily available, such as: Vegetable sticks with hummus. Greek yogurt. Fruit and nut trail mix. Eat balanced meals throughout the week. This includes: Fruit: 2-3 servings a day. Vegetables: 4-5 servings a day. Low-fat dairy: 2 servings a day. Fish, poultry, or lean meat: 1 serving a day. Beans and legumes: 2 or more servings a week. Nuts and seeds: 1-2 servings a day. Whole grains: 6-8 servings a day. Extra-virgin olive oil: 3-4 servings a day. Limit red meat and sweets to only a few servings a month. Lifestyle  Cook and eat meals together with your family, when possible. Drink enough fluid to keep your urine pale yellow. Be physically active every day. This includes: Aerobic exercise like running or swimming. Leisure activities like gardening, walking, or housework. Get 7-8 hours of sleep each night. If recommended by your health care provider, drink red wine in moderation. This means 1 glass a day for nonpregnant women and 2 glasses a day for men. A glass of wine equals 5 oz (150 mL). What foods should I eat? Fruits Apples. Apricots. Avocado. Berries. Bananas. Cherries. Dates. Figs. Grapes. Lemons. Melon. Oranges. Peaches. Plums. Pomegranate. Vegetables Artichokes. Beets. Broccoli. Cabbage. Carrots. Eggplant. Green beans. Chard. Kale. Spinach. Onions. Leeks. Peas. Squash. Tomatoes. Peppers.  Radishes. Grains Whole-grain pasta. Brown rice. Bulgur wheat. Polenta. Couscous. Whole-wheat bread. [place]. Meats and other proteins Beans. Almonds. Sunflower seeds. Pine nuts. Peanuts. [place]. Salmon. Scallops. Shrimp. [place]. Tilapia. Clams. Oysters. Eggs. Poultry without skin. Dairy Low-fat milk. Cheese. Greek yogurt. Fats and oils Extra-virgin olive oil. Avocado oil. Grapeseed oil. Beverages Water. Red wine. Herbal tea. Sweets and desserts Greek yogurt with honey. Baked apples. Poached pears. Trail mix. Seasonings and condiments Basil. Cilantro. Coriander. Cumin. Mint. Parsley. Sage. Rosemary. Tarragon. Garlic. Oregano. Thyme. Pepper. Balsamic vinegar. Tahini. Hummus. Tomato sauce. Olives. Mushrooms. The items listed above may not be a complete list of foods and beverages you can eat. Contact a dietitian for more information. What foods should I limit? This is a list of foods that should be eaten rarely or only on special occasions. Fruits Fruit canned in syrup. Vegetables Deep-fried potatoes (french fries). Grains Prepackaged pasta or rice dishes. Prepackaged cereal with added sugar. Prepackaged snacks with added sugar. Meats and other proteins Beef. Pork. Lamb. Poultry with  skin. Hot dogs. Berniece Salines. Dairy Ice cream. Sour cream. Whole milk. Fats and oils Butter. Canola oil. Vegetable oil. Beef fat (tallow). Lard. Beverages Juice. Sugar-sweetened soft drinks. Beer. Liquor and spirits. Sweets and desserts Cookies. Cakes. Pies. Candy. Seasonings and condiments Mayonnaise. Pre-made sauces and marinades. The items listed above may not be a complete list of foods and beverages you should limit. Contact a dietitian for more information. Summary The Mediterranean diet includes both food and lifestyle choices. Eat a variety of fresh fruits and vegetables, beans, nuts, seeds, and whole grains. Limit the amount of red meat and sweets that you eat. If recommended by your health  care provider, drink red wine in moderation. This means 1 glass a day for nonpregnant women and 2 glasses a day for men. A glass of wine equals 5 oz (150 mL). This information is not intended to replace advice given to you by your health care provider. Make sure you discuss any questions you have with your health care provider. Document Revised: 05/23/2019 Document Reviewed: 03/20/2019 Elsevier Patient Education  [place]

## 2022-01-23 NOTE — Telephone Encounter (Signed)
S/w [place] at [facility], [phone]. Doctor has not reviewed labs.  As soon as doctor reviews labs will fax to office.

## 2022-01-23 NOTE — Progress Notes (Addendum)
Cardiology Office Note:    Date:  01/26/2022   ID:  [name], DOB 10-23-1950, MRN [id number]  PCP:  [name], MD   [facility] Providers Cardiologist:  Candee Furbish, MD     Referring MD: [name], MD   Chief Complaint: preoperative cardiac evaluation  History of Present Illness:    [name] is a very pleasant 71 y.o. female with a hx of CAD s/p NSTEMI, PE, CKD stage 3, difficult to control HTN, and severe persistent asthma.   NSTEMI 12/2015 with LHC that revealed dRCA 90% stenosis and mRCA 70% stenosis treated with PTCA/DES x2 overlapping stents to mid and distal RCA.  Nuclear stress test 05/2020 with no evidence of ischemia.  Hypertension difficult to control.  Previously tried irbesartan, valsartan, valsartan-HCTZ (thought to have stopped ACE and ARB due to cough and asthma symptoms), metoprolol succinate, amlodipine.   She presented to the ED on 06/19/2021 for respiratory distress.  Her symptoms started 3 days prior, taking nebulizer and steroids at home with no improvement.  ENT solved no anatomical issue and recommendation for treatment for chronic reflux.  She was feeling better but continued to wheeze.  Discharged on oral prednisone, Protonix, Carafate, antihistamines, and as needed Maalox.  She had 6 hospitalizations from December 2022 to February 2023.  She was last seen in our office on 07/14/2021 by Dr. Marlou Porch at which time BP moderately elevated on Bystolic, diltiazem, hydralazine. No symptoms of angina. Follow-up in 6 months was recommended. Notation that aspirin discontinued due to GERD.   She is here today for preoperative clearance for upcoming knee surgery. Is feeling well, no specific concerns today. Has mild left LE edema due to needing knee surgery. Multiple hospitalizations for asthma. BP has been elevated recently.  24 Diet recall: Coffee, Fried fish, slaw, french fries Typically eats a boiled egg every morning with a few strips of bacon.  Stressful family situation with her husband at home, requires a lot of care, has stomach cancer with metastasis. She denies chest pain, shortness of breath, fatigue, palpitations, melena, hematuria, hemoptysis, diaphoresis, weakness, presyncope, syncope, orthopnea, and PND. Reports compliance with all of her anti-hypertensives. PCP recently increased diltiazem and bystolic.  BP remains quite elevated. Low dose chlorthalidone was added for BP control with plan for close follow-up in one week.  Today, she is here alone for follow-up. Reports home BP readings have been mostly elevated, one occasion of SBP 150s. Advised by Dr. Melvyn Novas to wear compression stockings for bilateral leg edema. Says she has tried to consume less sodium, does not totally avoid salt though. Does not drink ETOH. Has DOE she feels is 2/2 to asthma. Thought symptoms prior to MI in 2017 were 2/2 asthma. She denies chest pain, fatigue, palpitations, melena, hematuria, hemoptysis, diaphoresis, weakness, presyncope, syncope, orthopnea, and PND.  Past Medical History:  Diagnosis Date   Anxiety    Arthritis    Asthma    Chronic diastolic CHF (congestive heart failure) ([place]) 07/17/2016   Coronary artery disease    a. NSTEMI 12/2015 - LHC 01/18/16: s/p overlapping DESx2 to RCA, 10% ost-prox Cx,10% mLAD.   Dementia ([place])    Depression    Diabetes mellitus    Dyslipidemia    GERD (gastroesophageal reflux disease)    History of seizure    Hypertension    Hypertensive heart disease    NSTEMI (non-ST elevated myocardial infarction) ([place]) 12/2015   Stage 3 chronic kidney disease (HCC)     Past  Surgical History:  Procedure Laterality Date   ABDOMINAL HYSTERECTOMY     CARDIAC CATHETERIZATION  1994   minimal LAD dz, no other dz, EF normal   CARDIAC CATHETERIZATION N/A 01/18/2016   Procedure: Left Heart Cath and Coronary Angiography;  Surgeon: Burnell Blanks, MD;  Location: [place] [facility];  Service: Cardiovascular;  Laterality:  N/A;   CARDIAC CATHETERIZATION N/A 01/18/2016   Procedure: Coronary Stent Intervention;  Surgeon: Burnell Blanks, MD;  Location: [place] [facility];  Service: Cardiovascular;  Laterality: N/A;   COLONOSCOPY WITH PROPOFOL N/A 04/09/2014   Procedure: COLONOSCOPY WITH PROPOFOL;  Surgeon: Cleotis Nipper, MD;  Location: [facility];  Service: Endoscopy;  Laterality: N/A;   CORONARY STENT PLACEMENT  01/19/2016   STENT SYNERGY DES [id number] drug eluting stent was successfully placed, and overlaps the 2.5 x 38 mm Synergy stent placed distally.   ESOPHAGOGASTRODUODENOSCOPY (EGD) WITH PROPOFOL N/A 04/09/2014   Procedure: ESOPHAGOGASTRODUODENOSCOPY (EGD) WITH PROPOFOL;  Surgeon: Cleotis Nipper, MD;  Location: [facility];  Service: Endoscopy;  Laterality: N/A;   FOOT SURGERY Bilateral    approx ~2000   HEMORRHOID SURGERY     HERNIA REPAIR     KNEE ARTHROSCOPY     RADIOACTIVE SEED GUIDED EXCISIONAL BREAST BIOPSY Left 01/20/2021   Procedure: RADIOACTIVE SEED GUIDED EXCISIONAL LEFT BREAST BIOPSY;  Surgeon: Stark Klein, MD;  Location: [facility];  Service: General;  Laterality: Left;    Current Medications: Current Meds  Medication Sig   albuterol (PROAIR HFA) 108 (90 Base) MCG/ACT inhaler Inhale 2 puffs into the lungs every 4 (four) hours as needed for wheezing or shortness of breath.   albuterol (PROVENTIL) (2.5 MG/3ML) 0.083% nebulizer solution Take 3 mLs (2.5 mg total) by nebulization every 4 (four) hours as needed for wheezing or shortness of breath.   ALPRAZolam (XANAX) 0.5 MG tablet Take 1 tablet (0.5 mg total) by mouth 2 (two) times daily as needed for anxiety. (Patient taking differently: Take 0.5 mg by mouth See admin instructions. Take 0.5 mg by mouth at bedtime and an additional 0.5 mg once a day as needed for anxiety)   atorvastatin (LIPITOR) 40 MG tablet Take 40 mg by mouth at bedtime.   Biotin 5000 MCG CAPS Take 5,000 mcg by mouth daily.   budesonide (PULMICORT) 0.5 MG/2ML nebulizer  solution USE 1 VIAL IN NEBULIZER IN THE MORNING AND AT BEDTIME (Patient taking differently: Take 0.5 mg by nebulization in the morning and at bedtime.)   chlorthalidone (HYGROTON) 25 MG tablet Take 0.5 tablets (12.5 mg total) by mouth daily.   diltiazem (CARDIZEM CD) 360 MG 24 hr capsule Take 360 mg by mouth daily.   divalproex (DEPAKOTE ER) 500 MG 24 hr tablet TAKE 1 TABLET BY MOUTH AT  BEDTIME (Patient taking differently: Take 500 mg by mouth at bedtime.)   ferrous sulfate 325 (65 FE) MG tablet Take 325 mg by mouth daily with breakfast.   fluticasone (FLONASE) 50 MCG/ACT nasal spray Place 2 sprays into both nostrils daily as needed for allergies.   formoterol (PERFOROMIST) 20 MCG/2ML nebulizer solution USE 1 VIAL  IN  NEBULIZER TWICE  DAILY - morning and evening (Patient taking differently: Take 20 mcg by nebulization in the morning and at bedtime.)   glipiZIDE (GLUCOTROL) 10 MG tablet Take 10 mg by mouth daily.   hydrALAZINE (APRESOLINE) 100 MG tablet TAKE 1 TABLET BY MOUTH 3 TIMES  DAILY (Patient taking differently: Take 100 mg by mouth 3 (three) times daily.)  latanoprost (XALATAN) 0.005 % ophthalmic solution Place 1 drop into both eyes at bedtime.   magnesium oxide (MAG-OX) 400 (240 Mg) MG tablet Take 1 tablet (400 mg total) by mouth daily.   memantine (NAMENDA) 10 MG tablet TAKE 1 TABLET BY MOUTH TWICE  DAILY   metFORMIN (GLUCOPHAGE) 1000 MG tablet Take 1,000 mg by mouth 2 (two) times daily with a meal.   montelukast (SINGULAIR) 10 MG tablet Take 1 tablet (10 mg total) by mouth at bedtime.   Multiple Vitamins-Minerals (ONE-A-DAY WOMENS PO) Take 1 tablet by mouth daily with breakfast.   nebivolol (BYSTOLIC) 10 MG tablet Take 1 tablet (10 mg total) by mouth daily. (Patient taking differently: Take 10 mg by mouth 2 (two) times daily.)   nitroGLYCERIN (NITROSTAT) 0.4 MG SL tablet Place 1 tablet (0.4 mg total) under the tongue every 5 (five) minutes as needed for chest pain. Don't exceed more than  3 doses in 15 mins   pantoprazole (PROTONIX) 40 MG tablet Take 1 tablet (40 mg total) by mouth 2 (two) times daily before a meal.   polyethylene glycol (MIRALAX / GLYCOLAX) 17 g packet Take 17 g by mouth daily as needed.   potassium chloride (KLOR-CON M) 10 MEQ tablet Take 1 tablet (10 mEq total) by mouth daily.   Probiotic Product (EQ PROBIOTIC) CAPS Take 1 capsule by mouth every morning.   spironolactone (ALDACTONE) 25 MG tablet Take 1 tablet (25 mg total) by mouth daily.     Allergies:   Tramadol and Chlorthalidone   Social History   Socioeconomic History   Marital status: Married    Spouse name: Not on file   Number of children: 3   Years of education: some college   Highest education level: Not on file  Occupational History   Occupation: Nutritional therapist  Tobacco Use   Smoking status: Former    Packs/day: 1.00    Years: 30.00    Total pack years: 30.00    Types: Cigarettes    Quit date: 01/09/2004    Years since quitting: 18.0   Smokeless tobacco: Never  Vaping Use   Vaping Use: Never used  Substance and Sexual Activity   Alcohol use: No    Alcohol/week: 0.0 standard drinks of alcohol   Drug use: No   Sexual activity: Not on file  Other Topics Concern   Not on file  Social History Narrative   Pt lives with husband in [place].   Right-handed.   2 cups caffeine per day.   Social Determinants of Health   Financial Resource Strain: Not on file  Food Insecurity: Not on file  Transportation Needs: Not on file  Physical Activity: Not on file  Stress: Not on file  Social Connections: Not on file     Family History: The patient's family history includes Allergies in her mother; Asthma in her mother; CAD (age of onset: 41) in her father; CAD (age of onset: 19) in her sister; Congestive Heart Failure in her sister; Diabetes in her father; Hypertension in her father.  ROS:   Please see the history of present illness.    + LLE edema + DOE All other systems reviewed  and are negative.  Labs/Other Studies Reviewed:    The following studies were reviewed today:  Nuclear Stress Test 05/31/20  Nuclear stress EF: 75%. There was no ST segment deviation noted during stress. The study is normal. This is a low risk study. The left ventricular ejection fraction is hyperdynamic (>65%).  There is significant bowel radioisotope uptake in the vicinity of the inferior LV wall, without evidence of perfusion defect.    No ischemia or infarction on perfusion imaging.    Echo 08/13/2017  Left ventricle: The cavity size was normal. There was moderate    concentric hypertrophy. Systolic function was vigorous. The    estimated ejection fraction was in the range of 65% to 70%. Wall    motion was normal; there were no regional wall motion    abnormalities. Features are consistent with a pseudonormal left    ventricular filling pattern, with concomitant abnormal relaxation    and increased filling pressure (grade 2 diastolic dysfunction).    Doppler parameters are consistent with high ventricular filling    pressure.  - Mitral valve: There was trivial regurgitation.  - Pulmonary arteries: PA peak pressure: 34 mm Hg (S).   LHC 01/18/2016  Mid RCA lesion, 70 %stenosed. Dist RCA lesion, 90 %stenosed. A STENT SYNERGY DES [id number] drug eluting stent was successfully placed, and overlaps the 2.5 x 38 mm Synergy stent placed distally. Prox RCA lesion, 80 %stenosed. Post intervention, there is a 0% residual stenosis. Ost Cx to Prox Cx lesion, 10 %stenosed. Mid LAD lesion, 10 %stenosed.   1. NSTEMI 2. Severe single vessel CAD with severe stenosis in the mid and distal RCA 3. Successful PTCA/DES x 2 mid and distal RCA   Recommendations: Dual anti-platelet therapy with ASA and Brilinta for one year. Continue statin.   Recent Labs: 05/13/2021: TSH 0.622 06/24/2021: Magnesium 2.6 08/20/2021: B Natriuretic Peptide 24.0 10/04/2021: ALT 17; BUN 17; Creatinine, Ser 1.37;  Hemoglobin 12.2; Platelets 333; Potassium 3.9; Sodium 140  01/10/22 A1C 6.1 01/20/22 from nephrology: Magnesium 1.7, Creatinine 1.14, GFR 51, K+ 4.2   Recent Lipid Panel From [facility] 01/10/22 LDL 71, HDL 60, Trigs 115  Risk Assessment/Calculations:      Physical Exam:    VS:  BP (!) 200/86   Pulse 79   Ht '5\' 2"'$  (1.575 m)   Wt 159 lb 3.2 oz (72.2 kg)   LMP  (LMP Unknown)   BMI 29.12 kg/m     Wt Readings from Last 3 Encounters:  01/26/22 159 lb 3.2 oz (72.2 kg)  01/20/22 165 lb (74.8 kg)  01/20/22 165 lb (74.8 kg)     GEN:  Well nourished, well developed in no acute distress HEENT: Normal NECK: No JVD; No carotid bruits CARDIAC: RRR, no murmurs, rubs, gallops RESPIRATORY:  Clear to auscultation without rales, wheezing or rhonchi  ABDOMEN: Soft, non-tender, non-distended MUSCULOSKELETAL:  Mild LE edema, is wearing compression stockings.  2+ pedal pulses, equal bilaterally SKIN: Warm and dry NEUROLOGIC:  Alert and oriented x 3 PSYCHIATRIC:  Normal affect   EKG:  EKG is not ordered today  HYPERTENSION CONTROL Vitals:   01/26/22 1009 01/26/22 1024 01/26/22 1039  BP: (!) 190/88 (!) 176/101 (!) 200/86    The patient's blood pressure is elevated above target today.  In order to address the patient's elevated BP: A new medication was prescribed today.     Diagnoses:    1. Essential hypertension   2. Coronary artery disease involving native coronary artery of native heart without angina pectoris   3. Hyperlipidemia LDL goal <70   4. Shortness of breath   5. Preoperative cardiovascular examination     Assessment and Plan:     Preoperative cardiac evaluation: At last office visit 01/20/22, she did not verbalize symptoms of DOE with activity. Today, she endorses DOE  that may be symptom of angina as she felt this prior to coronary intervention in 2017.  We will get Lexiscan Myoview to rule out ischemia.  Per Dr. Marlou Porch, in regards to blood pressure, she may proceed to surgery  without further evidence of improvement in BP as this has been long-standing and she is asymptomatic. Will await results of nuclear stress test prior to formal cardiac clearance for upcoming knee surgery. According to [facility], she is at 0.9% of MACE. Addendum: Low risk nuclear stress test with no evidence of ischemia on 02/06/22. She may proceed with surgery without further testing.   Hypertension: BP remains significantly elevated today. Her BP has been quite difficult to control Somewhat limits dietary sodium, but not strictly. Kidney function on lab work 9/23 stable. She is asymptomatic without visual changes, headache, or chest pain. Discussed with Dr. Marlou Porch, primary cardiologist, and we will add spironolactone 25 mg daily.  We will check bmet in 10 days when she returns from a trip out of town. Encouraged stricter low sodium diet. We will refer her to advanced hypertension clinic for evaluation.  DOE/CAD without angina: Severe single-vessel CAD with severe stenosis in the mid and distal RCA with successful PTCA/DES x2 on cath 2017. Endorses dyspnea on exertion, no chest pain. Dyspnea was symptom prior to previous cath in 2017. Also has leg edema that she thinks is 2/2 knee pain/injury. She denies orthopnea and PND.  NST 05/2020 with no evidence of ischemia, normal LV function, low risk. We will get lexiscan myoview to r/o ischemia in the setting of upcoming surgery. Could not tolerate ASA 2/2 GERD. Continue beta blocker, atorvastatin.   CKD Stage 3: Creatinine 1.14 on 01/21/22. Adding spironolactone 25 mg daily. Will  recheck BMET in 10 days when she returns from trip out-of-town.      Disposition: Referral to Advanced Hypertension Clinic/6 months with Dr. Marlou Porch or APP  Medication Adjustments/Labs and Tests Ordered: Current medicines are reviewed at length with the patient today.  Concerns regarding medicines are outlined above.  Orders Placed This Encounter  Procedures   Basic Metabolic Panel (BMET)    AMB REFERRAL TO ADVANCED HTN CLINIC   Cardiac Stress Test: Informed Consent Details: Physician/Practitioner Attestation; Transcribe to consent form and obtain patient signature   Myocardial Perfusion Imaging   Meds ordered this encounter  Medications   spironolactone (ALDACTONE) 25 MG tablet    Sig: Take 1 tablet (25 mg total) by mouth daily.    Dispense:  30 tablet    Refill:  11    Patient Instructions  Medication Instructions:   START Spironolactone (1) tablet by mouth ( 25 mg) daily.   *If you need a refill on your cardiac medications before your next appointment, please call your pharmacy*   Lab Work:  Your physician recommends that you return for lab work on October 9 same day as test/BMET.   If you have labs (blood work) drawn today and your tests are completely normal, you will receive your results only by: [place] (if you have MyChart) OR A paper copy in the mail If you have any lab test that is abnormal or we need to change your treatment, we will call you to review the results.   Testing/Procedures:  You are scheduled for a Myocardial Perfusion Imaging Study on Monday, October 9  at 10:30 am.   Please arrive 15 minutes prior to your appointment time for registration and insurance purposes.   The test will take approximately 3 to  4 hours to complete; you may bring reading material. If someone comes with you to your appointment, they will need to remain in the main lobby due to limited space in the testing area.   How to prepare for your Myocardial Perfusion test:   Do not eat or drink 3 hours prior to your test, except you may have water.    Do not consume products containing caffeine (regular or decaffeinated) 12 hours prior to your test (ex: coffee, chocolate, soda, tea)   Do bring a list of your current medications with you. If not listed below, you may take your medications as normal.   Bring any held medication to your appointment, as you may  be required to take it once the test is complete.   Do wear comfortable clothes (no dresses) and walking shoes. Tennis shoes are preferred. No heels or open toed shoes.  Do not wear perfume  or lotions (deodorant is allowed).   If these instructions are not followed, you test will have to be rescheduled.   Please report to [address] for your test. If you have questions or concerns about your appointment, please call the Nuclear Lab at [phone].  If you cannot keep your appointment, please provide 24 hour notification to the Nuclear lab to avoid a possible $50 charge to your account.       Follow-Up: At [facility], you and your health needs are our priority.  As part of our continuing mission to provide you with exceptional heart care, we have created designated Provider Care Teams.  These Care Teams include your primary Cardiologist (physician) and Advanced Practice Providers (APPs -  Physician Assistants and Nurse Practitioners) who all work together to provide you with the care you need, when you need it.  We recommend signing up for the patient portal called "MyChart".  Sign up information is provided on this After Visit Summary.  MyChart is used to connect with patients for Virtual Visits (Telemedicine).  Patients are able to view lab/test results, encounter notes, upcoming appointments, etc.  Non-urgent messages can be sent to your provider as well.   To learn more about what you can do with MyChart, go to [email or web address].    Your next appointment:   6 month(s)  The format for your next appointment:   In Person  Provider:   Candee Furbish, MD     Other Instructions  You have been referred to Dr. Skeet Latch at the Hypertension Clinic at [facility]. The office will call to schedule you an appointment.  The office is a couple months out.    Important Information About Sugar         Signed, Emmaline Life, NP   01/26/2022 12:27 PM    [place]

## 2022-01-24 ENCOUNTER — Telehealth: Payer: Self-pay | Admitting: Cardiology

## 2022-01-24 NOTE — Telephone Encounter (Signed)
Pt called back per message received in Triage.    Pt stated her heart rate was checked this afternoon by a visiting RN, and it was 48.  It has been fluctuating between upper 40's and low 50's today.    Pt also stated her blood pressure was 124/72.  Pt denies any symptoms, is not dizzy when ambulating, NO near syncope, etc... Pt stated this is the first time her HR was found to be this low.    Since patient is asymptomatic, and Bp is WNL, pt advised to check her HR periodically throughout the afternoon and evening.  If this occurs again, and she becomes symptomatic, pt advised to contact [facility].   Pt also advised symptoms worsen overnight, and she becomes dizzy, short of breath, or has any other symptoms to go to nearest ER.  Pt understood.

## 2022-01-24 NOTE — Telephone Encounter (Signed)
STAT if HR is under 50 or over 120 (normal HR is 60-100 beats per minute)  What is your heart rate? 48  Do you have a log of your heart rate readings (document readings)?  48 74    Do you have any other symptoms? 127/77 BP

## 2022-01-26 ENCOUNTER — Encounter: Payer: Self-pay | Admitting: Nurse Practitioner

## 2022-01-26 ENCOUNTER — Ambulatory Visit: Payer: Medicare Other | Attending: Nurse Practitioner | Admitting: Nurse Practitioner

## 2022-01-26 VITALS — BP 200/86 | HR 79 | Ht 62.0 in | Wt 159.2 lb

## 2022-01-26 DIAGNOSIS — I10 Essential (primary) hypertension: Secondary | ICD-10-CM

## 2022-01-26 DIAGNOSIS — Z01810 Encounter for preprocedural cardiovascular examination: Secondary | ICD-10-CM

## 2022-01-26 DIAGNOSIS — N1831 Chronic kidney disease, stage 3a: Secondary | ICD-10-CM

## 2022-01-26 DIAGNOSIS — E785 Hyperlipidemia, unspecified: Secondary | ICD-10-CM

## 2022-01-26 DIAGNOSIS — I2510 Atherosclerotic heart disease of native coronary artery without angina pectoris: Secondary | ICD-10-CM

## 2022-01-26 DIAGNOSIS — R0602 Shortness of breath: Secondary | ICD-10-CM

## 2022-01-26 MED ORDER — SPIRONOLACTONE 25 MG PO TABS: 25.0000 mg | ORAL_TABLET | Freq: Every day | ORAL | 11 refills | Status: DC

## 2022-01-26 NOTE — Patient Instructions (Signed)
Medication Instructions:   START Spironolactone (1) tablet by mouth ( 25 mg) daily.   *If you need a refill on your cardiac medications before your next appointment, please call your pharmacy*   Lab Work:  Your physician recommends that you return for lab work on October 9 same day as test/BMET.   If you have labs (blood work) drawn today and your tests are completely normal, you will receive your results only by: [place] (if you have MyChart) OR A paper copy in the mail If you have any lab test that is abnormal or we need to change your treatment, we will call you to review the results.   Testing/Procedures:  You are scheduled for a Myocardial Perfusion Imaging Study on Monday, October 9  at 10:30 am.   Please arrive 15 minutes prior to your appointment time for registration and insurance purposes.   The test will take approximately 3 to 4 hours to complete; you may bring reading material. If someone comes with you to your appointment, they will need to remain in the main lobby due to limited space in the testing area.   How to prepare for your Myocardial Perfusion test:   Do not eat or drink 3 hours prior to your test, except you may have water.    Do not consume products containing caffeine (regular or decaffeinated) 12 hours prior to your test (ex: coffee, chocolate, soda, tea)   Do bring a list of your current medications with you. If not listed below, you may take your medications as normal.   Bring any held medication to your appointment, as you may be required to take it once the test is complete.   Do wear comfortable clothes (no dresses) and walking shoes. Tennis shoes are preferred. No heels or open toed shoes.  Do not wear perfume  or lotions (deodorant is allowed).   If these instructions are not followed, you test will have to be rescheduled.   Please report to [address] for your test. If you have questions or concerns about  your appointment, please call the Nuclear Lab at [phone].  If you cannot keep your appointment, please provide 24 hour notification to the Nuclear lab to avoid a possible $50 charge to your account.       Follow-Up: At [facility], you and your health needs are our priority.  As part of our continuing mission to provide you with exceptional heart care, we have created designated Provider Care Teams.  These Care Teams include your primary Cardiologist (physician) and Advanced Practice Providers (APPs -  Physician Assistants and Nurse Practitioners) who all work together to provide you with the care you need, when you need it.  We recommend signing up for the patient portal called "MyChart".  Sign up information is provided on this After Visit Summary.  MyChart is used to connect with patients for Virtual Visits (Telemedicine).  Patients are able to view lab/test results, encounter notes, upcoming appointments, etc.  Non-urgent messages can be sent to your provider as well.   To learn more about what you can do with MyChart, go to [email or web address].    Your next appointment:   6 month(s)  The format for your next appointment:   In Person  Provider:   Candee Furbish, MD     Other Instructions  You have been referred to Dr. Skeet Latch at the Hypertension Clinic at [facility]. The office will call to schedule you an  appointment.  The office is a couple months out.    Important Information About Sugar

## 2022-01-31 ENCOUNTER — Telehealth (HOSPITAL_COMMUNITY): Payer: Self-pay | Admitting: *Deleted

## 2022-01-31 NOTE — Telephone Encounter (Signed)
Left message on voicemail per DPR in reference to upcoming appointment scheduled on 02/06/2022 at 10:30 with detailed instructions given per Myocardial Perfusion Study Information Sheet for the test. LM to arrive 15 minutes early, and that it is imperative to arrive on time for appointment to keep from having the test rescheduled. If you need to cancel or reschedule your appointment, please call the office within 24 hours of your appointment. Failure to do so may result in a cancellation of your appointment, and a $50 no show fee. Phone number given for call back for any questions.

## 2022-02-06 ENCOUNTER — Ambulatory Visit: Payer: Medicare Other

## 2022-02-06 ENCOUNTER — Ambulatory Visit (HOSPITAL_COMMUNITY): Payer: Medicare Other | Attending: Cardiology

## 2022-02-06 DIAGNOSIS — R0602 Shortness of breath: Secondary | ICD-10-CM | POA: Insufficient documentation

## 2022-02-06 DIAGNOSIS — Z01810 Encounter for preprocedural cardiovascular examination: Secondary | ICD-10-CM | POA: Insufficient documentation

## 2022-02-06 DIAGNOSIS — I2510 Atherosclerotic heart disease of native coronary artery without angina pectoris: Secondary | ICD-10-CM

## 2022-02-06 DIAGNOSIS — E785 Hyperlipidemia, unspecified: Secondary | ICD-10-CM

## 2022-02-06 DIAGNOSIS — I10 Essential (primary) hypertension: Secondary | ICD-10-CM | POA: Diagnosis present

## 2022-02-06 LAB — BASIC METABOLIC PANEL
BUN/Creatinine Ratio: 19 (ref 12–28)
BUN: 31 mg/dL — ABNORMAL HIGH (ref 8–27)
CO2: 22 mmol/L (ref 20–29)
Calcium: 9.9 mg/dL (ref 8.7–10.3)
Chloride: 99 mmol/L (ref 96–106)
Creatinine, Ser: 1.6 mg/dL — ABNORMAL HIGH (ref 0.57–1.00)
Glucose: 115 mg/dL — ABNORMAL HIGH (ref 70–99)
Potassium: 4.4 mmol/L (ref 3.5–5.2)
Sodium: 136 mmol/L (ref 134–144)
eGFR: 34 mL/min/{1.73_m2} — ABNORMAL LOW (ref >59)

## 2022-02-06 LAB — MYOCARDIAL PERFUSION IMAGING
LV dias vol: 65 mL (ref 46–106)
LV sys vol: 19 mL
Nuc Stress EF: 71 %
Peak HR: 99 {beats}/min
Rest HR: 69 {beats}/min
Rest Nuclear Isotope Dose: 10.6 mCi
SDS: 0
SRS: 0
SSS: 0
ST Depression (mm): 0 mm
Stress Nuclear Isotope Dose: 30 mCi
TID: 1.09

## 2022-02-06 MED ORDER — REGADENOSON 0.4 MG/5ML IV SOLN
0.4000 mg | Freq: Once | INTRAVENOUS | Status: AC
  Administered 2022-02-06: 0.4 mg via INTRAVENOUS

## 2022-02-06 MED ORDER — TECHNETIUM TC 99M TETROFOSMIN IV KIT
30.0000 | PACK | Freq: Once | INTRAVENOUS | Status: AC | PRN
  Administered 2022-02-06: 30 via INTRAVENOUS

## 2022-02-06 MED ORDER — TECHNETIUM TC 99M TETROFOSMIN IV KIT
10.6000 | PACK | Freq: Once | INTRAVENOUS | Status: AC | PRN
  Administered 2022-02-06: 10.6 via INTRAVENOUS

## 2022-02-08 NOTE — Telephone Encounter (Signed)
Yes but tell her to be sure to use her performist neb the am of the procedure and 12 hours prior to the am treatment as well  (she uses the pm dose prn anyway so it's not really a change in rx)

## 2022-02-08 NOTE — Telephone Encounter (Signed)
Patient called and states she needs surgical clearance form and/or notes faxed from 9/22. Please advise.

## 2022-02-08 NOTE — Telephone Encounter (Signed)
OV notes and clearance form have been faxed back to [facility]. Nothing further needed at this time.  

## 2022-02-08 NOTE — Telephone Encounter (Signed)
Dr Melvyn Novas,   Can you look at your note from 01/20/2022 on this patient and advise me if she is clear for surgery. Heart care is wanting to move her surgery up.   Thank you

## 2022-02-09 ENCOUNTER — Telehealth: Payer: Self-pay | Admitting: Cardiology

## 2022-02-09 NOTE — Telephone Encounter (Signed)
I have d/w pre op provider today Nicholes Rough, PAC. States pt is cleared and she will fax notes over to surgeon office

## 2022-02-09 NOTE — Telephone Encounter (Signed)
Caller stated they received notes from the patient's OV on 9/28 which shows the patient still had BP issue.  Caller would like to clarify if the patient is cleared for the surgery.

## 2022-02-14 ENCOUNTER — Encounter: Payer: Self-pay | Admitting: Cardiovascular Disease

## 2022-03-15 ENCOUNTER — Encounter: Payer: Self-pay | Admitting: Internal Medicine

## 2022-03-15 ENCOUNTER — Ambulatory Visit (INDEPENDENT_AMBULATORY_CARE_PROVIDER_SITE_OTHER): Payer: Medicare Other | Admitting: Internal Medicine

## 2022-03-15 VITALS — BP 142/78 | HR 68 | Temp 98.1°F | Ht 62.0 in | Wt 157.6 lb

## 2022-03-15 DIAGNOSIS — R058 Other specified cough: Secondary | ICD-10-CM | POA: Diagnosis not present

## 2022-03-15 DIAGNOSIS — J4530 Mild persistent asthma, uncomplicated: Secondary | ICD-10-CM

## 2022-03-15 NOTE — Progress Notes (Signed)
Subjective:   Patient ID: [name], female    DOB: 08-21-1950    MRN: [id number]    Brief patient profile:  14   yobf quit smoking 2005 with nl pfts 2012  with recurrent non-specific resp flares with nl pfts during flares c/w pseudoasthma.     History of Present Illness  01/24/2016  Extended post hosp f/u ov/ transition of care/ /Jaleena Viviani re: dtca asthma/ vcd symb 160 and pred 5 x 10 mg daily  Chief Complaint  Patient presents with   Follow-up    Pt. recently got out the hopistal for an asthma attack, Pt. states her breathing remains the same, coughing with some yellow to white mucus,Has been needing to use Ventolin more often  last doing well 2 months prior to OV   While on Symb 160/ gerd rx but not dosing ac as rec  Waking up prematurely x 2 months with cough/wheeze/ sob but never contacted me as per the instructions rec Plan A = Automatic = dulera 100 Take 2 puffs first thing in am and then another 2 puffs about 12 hours later.                                      Nexum 40 mg Take 30-60 min before first meal of the day and Pepcid AC 20 mg  at bedtime Work on inhaler technique  Plan B = Backup Only use your albuterol as a rescue medication Plan C = Crisis - only use your albuterol nebulizer if you first try Plan B and it fails to help > ok to use the nebulizer up to every 4 hours but if start needing it regularly call for immediate appointment GERD diet  Take delsym two tsp every 12 hours and use the flutter valve as much as possible and supplement if needed with  tramadol 50 mg up to 2 every 4 hours to suppress the urge to cough  Once you have eliminated the cough for 3 straight days try reducing the tramadol first,  then the delsym as tolerated.   Prednisone 5 mg x 8, then 6, then 4 then 2, then 1 daily x 2 days and off  please schedule a follow up office visit in 2 weeks, sooner if needed with all meds/ inhalers/ neb solutions in hand     Admit date:     05/13/2021   Discharge date: 05/14/21   Discharge Diagnoses Principal Problem:   Hypomagnesemia   Chronic diastolic CHF (congestive heart failure) (HCC)   Severe persistent asthma without complication   Chronic kidney disease, stage 3a (HCC)   CAD in native artery   Diabetes mellitus type 2, controlled ([place])   Essential hypertension   G E R D   Depression with anxiety   Hyperlipidemia   Vocal cord dysfunction   Hospital Course    * Hypomagnesemia- (present on admission) Presents with complaints of muscle cramps, body aches as well as muscle spasms  x few days.PTA Lab work at the PCPs office showed hypomagnesemia. Patient received 4 g of IV magnesium in the ER. No event on telemetry. Potassium and magnesium level stable.  We will continue supplements on discharge.   Severe persistent asthma without complication- (present on admission) Patient denies having any wheezing when she went to her PCPs office. Possibly that her wheezing actually started getting worse on 1/13 only. Continue nebulizer therapy, Garlon Hatchet  and Pulmicort.  As needed DuoNebs.  I do not think the patient requires steroids for now.     Chronic diastolic CHF (congestive heart failure) ([place])- (present on admission) Volume status adequate. Does not appear to be volume overloaded.  Weight actually down 15 pound after discharge from the hospital.     Chronic kidney disease, stage 3a ([place])- (present on admission) Renal function stable close to baseline of 1.16. Monitor.   CAD in native artery- (present on admission) Myoview in 2022 negative. Monitor for now.     Diabetes mellitus type 2, controlled ([place]) Hemoglobin A1c 1.8. On metformin at home   Essential hypertension- (present on admission) Blood pressure stable. Continue home regimen.   G E R D- (present on admission) Continue PPI.   Depression with anxiety- (present on admission) Continue Xanax home regimen.   Hyperlipidemia- (present on admission) On  Lipitor at home. Continue.   Vocal cord dysfunction- (present on admission) Suspecting that is responsible for patient's upper airway wheezing. Recommend ENT follow-up and referral outpatient.       01/20/2022  f/u ov/Kamayah Pillay re: vcd/ asthma maint on performist/bud each am  and skips pm / no recent prednisone Chief Complaint  Patient presents with   Follow-up    She is doing well and wants to talk about her surgery.    Dyspnea:  L knee limiting  Cough: none / Speech therapy helped  Sleeping: flat bed 2 pillows no problem  SABA use: none  02: none  Covid status:   all vax / infected  twice  Rec No change in medications You are cleared for surgery     03/15/2022  f/u ov/Evon Dejarnett re: vcd/asthma  maint on performist/ bud/singulair/ gerd rx   / did fine with surgery  Chief Complaint  Patient presents with   Follow-up    No new issues since LOV.  Dyspnea:  walk anywhere since L knee surgery but mostly w/in house and mb and back Cough: none  Sleeping: flat bed 2 pillows SABA use: none  02: none      No obvious day to day or daytime variability or assoc excess/ purulent sputum or mucus plugs or hemoptysis or cp or chest tightness, subjective wheeze or overt sinus or hb symptoms.   Sleeping  without nocturnal  or early am exacerbation  of respiratory  c/o's or need for noct saba. Also denies any obvious fluctuation of symptoms with weather or environmental changes or other aggravating or alleviating factors except as outlined above   No unusual exposure hx or h/o childhood pna/ asthma or knowledge of premature birth.  Current Allergies, Complete Past Medical History, Past Surgical History, Family History, and Social History were reviewed in [employer] record.  ROS  The following are not active complaints unless bolded Hoarseness, sore throat, dysphagia, dental problems, itching, sneezing,  nasal congestion or discharge of excess mucus or purulent secretions,  ear ache,   fever, chills, sweats, unintended wt loss or wt gain, classically pleuritic or exertional cp,  orthopnea pnd or arm/hand swelling  or leg swelling, presyncope, palpitations, abdominal pain, anorexia, nausea, vomiting, diarrhea  or change in bowel habits or change in bladder habits, change in stools or change in urine, dysuria, hematuria,  rash, arthralgias, visual complaints, headache, numbness, weakness or ataxia or problems with walking or coordination,  change in mood or  memory.              Objective:   Physical Exam  Wt  03/15/2022    157  01/20/2022      165  06/28/2021      173  05/17/2021      165 03/15/2021    161  09/08/2020      153 01/12/2020      150  09/09/2019     147  03/12/2019   157  11/04/2018       157  02/23/2016   168 >  04/07/2016  168 > 05/08/2016    168 >  07/17/2016  162 > 09/18/2016    164  > 09/18/2017  160 > 12/28/2017 167  > 03/11/2018  164 > 03/20/2018   160 > 04/05/2018   171 > 04/18/2018  165     Vital signs reviewed  03/15/2022  - Note at rest 02 sats  99% on RA   General appearance:    amb bf nad        HEENT : Oropharynx  clear      Nasal turbinates nl    NECK :  without  apparent JVD/ palpable Nodes/TM    LUNGS: no acc muscle use,  Nl contour chest which is clear to A and P bilaterally without cough on insp or exp maneuvers   CV:  RRR  no s3 or murmur or increase in P2, and no edema   ABD:  soft and nontender    MS:  Nl gait/ ext warm without deformities Or obvious joint restrictions  calf tenderness, cyanosis or clubbing    SKIN: warm and dry without lesions    NEURO:  alert, approp, nl sensorium with  no motor or cerebellar deficits apparent.      Outpatient Encounter Medications as of 03/15/2022  Medication Sig   albuterol (PROAIR HFA) 108 (90 Base) MCG/ACT inhaler Inhale 2 puffs into the lungs every 4 (four) hours as needed for wheezing or shortness of breath.   albuterol (PROVENTIL) (2.5 MG/3ML) 0.083% nebulizer solution  Take 3 mLs (2.5 mg total) by nebulization every 4 (four) hours as needed for wheezing or shortness of breath.   ALPRAZolam (XANAX) 0.5 MG tablet Take 1 tablet (0.5 mg total) by mouth 2 (two) times daily as needed for anxiety. (Patient taking differently: Take 0.5 mg by mouth See admin instructions. Take 0.5 mg by mouth at bedtime and an additional 0.5 mg once a day as needed for anxiety)   atorvastatin (LIPITOR) 40 MG tablet Take 40 mg by mouth at bedtime.   Biotin 5000 MCG CAPS Take 5,000 mcg by mouth daily.   budesonide (PULMICORT) 0.5 MG/2ML nebulizer solution USE 1 VIAL IN NEBULIZER IN THE MORNING AND AT BEDTIME (Patient taking differently: Take 0.5 mg by nebulization in the morning and at bedtime.)   diltiazem (CARDIZEM CD) 360 MG 24 hr capsule Take 360 mg by mouth daily.   divalproex (DEPAKOTE ER) 500 MG 24 hr tablet TAKE 1 TABLET BY MOUTH AT  BEDTIME (Patient taking differently: Take 500 mg by mouth at bedtime.)   ferrous sulfate 325 (65 FE) MG tablet Take 325 mg by mouth daily with breakfast.   fluticasone (FLONASE) 50 MCG/ACT nasal spray Place 2 sprays into both nostrils daily as needed for allergies.   formoterol (PERFOROMIST) 20 MCG/2ML nebulizer solution USE 1 VIAL  IN  NEBULIZER TWICE  DAILY - morning and evening (Patient taking differently: Take 20 mcg by nebulization in the morning and at bedtime.)   glipiZIDE (GLUCOTROL) 10 MG tablet Take 10 mg by mouth daily.   hydrALAZINE (  APRESOLINE) 100 MG tablet TAKE 1 TABLET BY MOUTH 3 TIMES  DAILY (Patient taking differently: Take 100 mg by mouth 3 (three) times daily.)   latanoprost (XALATAN) 0.005 % ophthalmic solution Place 1 drop into both eyes at bedtime.   magnesium oxide (MAG-OX) 400 (240 Mg) MG tablet Take 1 tablet (400 mg total) by mouth daily.   memantine (NAMENDA) 10 MG tablet TAKE 1 TABLET BY MOUTH TWICE  DAILY   metFORMIN (GLUCOPHAGE) 1000 MG tablet Take 1,000 mg by mouth 2 (two) times daily with a meal.   montelukast (SINGULAIR)  10 MG tablet Take 1 tablet (10 mg total) by mouth at bedtime.   Multiple Vitamins-Minerals (ONE-A-DAY WOMENS PO) Take 1 tablet by mouth daily with breakfast.   nebivolol (BYSTOLIC) 10 MG tablet Take 1 tablet (10 mg total) by mouth daily. (Patient taking differently: Take 10 mg by mouth 2 (two) times daily.)   nitroGLYCERIN (NITROSTAT) 0.4 MG SL tablet Place 1 tablet (0.4 mg total) under the tongue every 5 (five) minutes as needed for chest pain. Don't exceed more than 3 doses in 15 mins   pantoprazole (PROTONIX) 40 MG tablet Take 1 tablet (40 mg total) by mouth 2 (two) times daily before a meal.   Probiotic Product (EQ PROBIOTIC) CAPS Take 1 capsule by mouth every morning.   spironolactone (ALDACTONE) 25 MG tablet Take 1 tablet (25 mg total) by mouth daily.   [DISCONTINUED] chlorthalidone (HYGROTON) 25 MG tablet Take 0.5 tablets (12.5 mg total) by mouth daily.   [DISCONTINUED] polyethylene glycol (MIRALAX / GLYCOLAX) 17 g packet Take 17 g by mouth daily as needed.   [DISCONTINUED] potassium chloride (KLOR-CON M) 10 MEQ tablet Take 1 tablet (10 mEq total) by mouth daily.   No facility-administered encounter medications on file as of 03/15/2022.

## 2022-03-15 NOTE — Assessment & Plan Note (Signed)
- CT sinus 08/06/14 > neg  - Allergy profile 02/23/16  >  Eos 0.0 /  IgE  87 pos  RAST  Grass/ trees  - improved 05/08/2016 on gabapentin 300 tid > no change rx > d/c'd 07/2016 DgEs 10/09/17  Low range of penetration without aspiration. Otherwise, normal - Asthma exac 12/17/17- FENO 18. Significant upper airway wheeze, tx pred taper. Add neurontin '100mg'$  TID.  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 > rec max  gerd rx   - 04/05/2018 rechallenge with gabapentin 300  Qid  - PFT's  04/18/2018  FEV1 1.13 (68 % ) ratio 91  p 7 % improvement from saba p nothing prior to study with DLCO  53/60 % corrects to 147  % for alv volume  s curvature - 02/04/2019 informed only using gabapentin 300 mg hs so ok to d/c  06/23/21  Onset of symptoms reported  2012 p quit smoking  2005  - FEN0 01/24/2016 = 13 with active symptoms on symb 160 2bid > changed to dulera 100  - Spirometry 02/23/2016  NO signifiant obstruction with active symptoms  - 02/23/2016  After extensive coaching HFA effectiveness =    75%  - FENO 02/23/2016  =  12   On dulera 100 2bid (unable to confirm adeherence as did not bring it - Singulair 10 mg daily added 02/23/2016 to dulera 100 2bid with 2 week sample only  (unable to verify she used it) - Allergy profile 02/23/16  >  Eos 0.0 /  IgE  87 pos  RAST  Grass/ trees  - 04/07/2016   symb 80 x one sample given - FENO 04/07/2016  =   43 ? Really on meds ?  - 05/08/2016  After extensive coaching HFA effectiveness =    75% > continue symb 80 2bid - 07/17/2016 added singulair maint    - FENO 09/18/2016  =   15 on symb 160 2bid > try 80 2bid  - flare late feb  2019 ? What maint rx she was taking but on admit "non-adherent per notes 08/12/17  - 09/18/2017  After extensive coaching inhaler device  effectiveness =    75% from a baseline of 50% > restart symb 80 2bid - FENO 09/18/2017  =   20 on symb 160  - Spirometry 09/18/2017  No obstruction at all with active wheeze -  09/09/2019 added prednisone as "plan D"  - 09/08/2020  After extensive coaching inhaler device,  effectiveness =    50% from baseline of 25% (delayed breath in)    06/23/21 Ent eval c/w severe edema from GERD 08/30/20 Dx EG Trace gastroesophageal reflux without evidence of stricture or ulceration. Refer to [facility] 06/29/2021 >>> much better 01/20/2022    >>>  03/15/2022 ok to decreased ppi to q am ac if doing great          Each maintenance medication was reviewed in detail including emphasizing most importantly the difference between maintenance and prns and under what circumstances the prns are to be triggered using an action plan  format where appropriate.  Total time for H and P, chart review, counseling, reviewing neb device(s) and generating customized AVS unique to this office visit / same day charting > 30 min summary f/u ov with discussion of contingencies

## 2022-03-15 NOTE — Assessment & Plan Note (Signed)
Onset of symptoms reported  2012 p quit smoking  2005  - FEN0 01/24/2016 = 13 with active symptoms on symb 160 2bid > changed to dulera 100  - Spirometry 02/23/2016  NO signifiant obstruction with active symptoms  - 02/23/2016  After extensive coaching HFA effectiveness =    75%  - FENO 02/23/2016  =  12   On dulera 100 2bid (unable to confirm adeherence as did not bring it - Singulair 10 mg daily added 02/23/2016 to dulera 100 2bid with 2 week sample only  (unable to verify she used it) - Allergy profile 02/23/16  >  Eos 0.0 /  IgE  87 pos  RAST  Grass/ trees  - 04/07/2016   symb 80 x one sample given - FENO 04/07/2016  =   43 ? Really on meds ?  - 05/08/2016  After extensive coaching HFA effectiveness =    75% > continue symb 80 2bid - 07/17/2016 added singulair maint    - FENO 09/18/2016  =   15 on symb 160 2bid > try 80 2bid  - flare late feb 2019 ? What maint rx she was taking but on admit "non-adherent per notes 08/12/17  - 09/18/2017  After extensive coaching inhaler device  effectiveness =    75% from a baseline of 50% > restart symb 80 2bid - FENO 09/18/2017  =   20 on symb 160  - Spirometry 09/18/2017  No obstruction at all with active wheeze -  09/09/2019 added prednisone as "plan D"  - 09/08/2020  After extensive coaching inhaler device,  effectiveness =    50% from baseline of 25% (delayed breath in)    06/23/21 Ent eval c/w severe edema from GERD   All goals of chronic asthma control met including optimal function and elimination of symptoms with minimal need for rescue therapy.  Contingencies discussed in full including doubling the maint rx to twice daily and then  contacting this office immediately if not controlling the symptoms   F/u can be q 6 m - sooner if needed

## 2022-03-15 NOTE — Patient Instructions (Addendum)
If any flare of resp symptoms go ahead and resume twice daily nebulizer treatments with perforomist and budesonide  Late add: ok to leave off the pm dose of PPI if doing great    Please schedule a follow up visit in 6  months but call sooner if needed

## 2022-03-31 ENCOUNTER — Encounter (HOSPITAL_COMMUNITY): Payer: Self-pay

## 2022-03-31 ENCOUNTER — Telehealth: Payer: Self-pay | Admitting: Internal Medicine

## 2022-03-31 ENCOUNTER — Emergency Department (HOSPITAL_COMMUNITY): Payer: Medicare Other

## 2022-03-31 ENCOUNTER — Emergency Department (HOSPITAL_COMMUNITY)
Admission: EM | Admit: 2022-03-31 | Discharge: 2022-03-31 | Disposition: A | Discharge status: Home / Self Care | Payer: Medicare Other | Attending: Emergency Medicine | Admitting: Emergency Medicine

## 2022-03-31 ENCOUNTER — Other Ambulatory Visit: Payer: Self-pay

## 2022-03-31 DIAGNOSIS — I2510 Atherosclerotic heart disease of native coronary artery without angina pectoris: Secondary | ICD-10-CM | POA: Diagnosis not present

## 2022-03-31 DIAGNOSIS — N189 Chronic kidney disease, unspecified: Secondary | ICD-10-CM | POA: Diagnosis not present

## 2022-03-31 DIAGNOSIS — J4521 Mild intermittent asthma with (acute) exacerbation: Secondary | ICD-10-CM

## 2022-03-31 DIAGNOSIS — I509 Heart failure, unspecified: Secondary | ICD-10-CM | POA: Diagnosis not present

## 2022-03-31 DIAGNOSIS — Z79899 Other long term (current) drug therapy: Secondary | ICD-10-CM | POA: Diagnosis not present

## 2022-03-31 DIAGNOSIS — K0889 Other specified disorders of teeth and supporting structures: Secondary | ICD-10-CM | POA: Insufficient documentation

## 2022-03-31 DIAGNOSIS — Z7951 Long term (current) use of inhaled steroids: Secondary | ICD-10-CM | POA: Diagnosis not present

## 2022-03-31 DIAGNOSIS — R109 Unspecified abdominal pain: Secondary | ICD-10-CM | POA: Diagnosis not present

## 2022-03-31 DIAGNOSIS — E119 Type 2 diabetes mellitus without complications: Secondary | ICD-10-CM | POA: Insufficient documentation

## 2022-03-31 DIAGNOSIS — Z7984 Long term (current) use of oral hypoglycemic drugs: Secondary | ICD-10-CM | POA: Insufficient documentation

## 2022-03-31 DIAGNOSIS — I130 Hypertensive heart and chronic kidney disease with heart failure and stage 1 through stage 4 chronic kidney disease, or unspecified chronic kidney disease: Secondary | ICD-10-CM | POA: Diagnosis not present

## 2022-03-31 DIAGNOSIS — R0602 Shortness of breath: Secondary | ICD-10-CM | POA: Diagnosis present

## 2022-03-31 DIAGNOSIS — N281 Cyst of kidney, acquired: Secondary | ICD-10-CM | POA: Diagnosis not present

## 2022-03-31 DIAGNOSIS — Z20822 Contact with and (suspected) exposure to covid-19: Secondary | ICD-10-CM | POA: Diagnosis not present

## 2022-03-31 LAB — RESP PANEL BY RT-PCR (FLU A&B, COVID) ARPGX2
Influenza A by PCR: NEGATIVE
Influenza B by PCR: NEGATIVE
SARS Coronavirus 2 by RT PCR: NEGATIVE

## 2022-03-31 LAB — CBC WITH DIFFERENTIAL/PLATELET
Abs Immature Granulocytes: 0.04 10*3/uL (ref 0.00–0.07)
Basophils Absolute: 0.1 10*3/uL (ref 0.0–0.1)
Basophils Relative: 1 %
Eosinophils Absolute: 0 10*3/uL (ref 0.0–0.5)
Eosinophils Relative: 0 %
HCT: 36.9 % (ref 36.0–46.0)
Hemoglobin: 11.8 g/dL — ABNORMAL LOW (ref 12.0–15.0)
Immature Granulocytes: 0 %
Lymphocytes Relative: 26 %
Lymphs Abs: 2.6 10*3/uL (ref 0.7–4.0)
MCH: 31.8 pg (ref 26.0–34.0)
MCHC: 32 g/dL (ref 30.0–36.0)
MCV: 99.5 fL (ref 80.0–100.0)
Monocytes Absolute: 0.7 10*3/uL (ref 0.1–1.0)
Monocytes Relative: 7 %
Neutro Abs: 6.6 10*3/uL (ref 1.7–7.7)
Neutrophils Relative %: 66 %
Platelets: 335 10*3/uL (ref 150–400)
RBC: 3.71 MIL/uL — ABNORMAL LOW (ref 3.87–5.11)
RDW: 13.6 % (ref 11.5–15.5)
WBC: 10.1 10*3/uL (ref 4.0–10.5)
nRBC: 0 % (ref 0.0–0.2)

## 2022-03-31 LAB — URINALYSIS, ROUTINE W REFLEX MICROSCOPIC
Bilirubin Urine: NEGATIVE
Glucose, UA: NEGATIVE mg/dL
Hgb urine dipstick: NEGATIVE
Ketones, ur: NEGATIVE mg/dL
Leukocytes,Ua: NEGATIVE
Nitrite: NEGATIVE
Protein, ur: NEGATIVE mg/dL
Specific Gravity, Urine: 1.032 — ABNORMAL HIGH (ref 1.005–1.030)
pH: 6 (ref 5.0–8.0)

## 2022-03-31 LAB — BASIC METABOLIC PANEL
Anion gap: 9 (ref 5–15)
BUN: 30 mg/dL — ABNORMAL HIGH (ref 8–23)
CO2: 22 mmol/L (ref 22–32)
Calcium: 9.7 mg/dL (ref 8.9–10.3)
Chloride: 108 mmol/L (ref 98–111)
Creatinine, Ser: 1.31 mg/dL — ABNORMAL HIGH (ref 0.44–1.00)
GFR, Estimated: 43 mL/min — ABNORMAL LOW (ref >60)
Glucose, Bld: 123 mg/dL — ABNORMAL HIGH (ref 70–99)
Potassium: 5.1 mmol/L (ref 3.5–5.1)
Sodium: 139 mmol/L (ref 135–145)

## 2022-03-31 LAB — I-STAT CHEM 8, ED
BUN: 32 mg/dL — ABNORMAL HIGH (ref 8–23)
Calcium, Ion: 1.1 mmol/L — ABNORMAL LOW (ref 1.15–1.40)
Chloride: 108 mmol/L (ref 98–111)
Creatinine, Ser: 1.3 mg/dL — ABNORMAL HIGH (ref 0.44–1.00)
Glucose, Bld: 124 mg/dL — ABNORMAL HIGH (ref 70–99)
HCT: 38 % (ref 36.0–46.0)
Hemoglobin: 12.9 g/dL (ref 12.0–15.0)
Potassium: 5.3 mmol/L — ABNORMAL HIGH (ref 3.5–5.1)
Sodium: 140 mmol/L (ref 135–145)
TCO2: 23 mmol/L (ref 22–32)

## 2022-03-31 LAB — D-DIMER, QUANTITATIVE: D-Dimer, Quant: 0.65 ug/mL-FEU — ABNORMAL HIGH (ref 0.00–0.50)

## 2022-03-31 LAB — BRAIN NATRIURETIC PEPTIDE: B Natriuretic Peptide: 25.1 pg/mL (ref 0.0–100.0)

## 2022-03-31 MED ORDER — IOHEXOL 350 MG/ML SOLN
80.0000 mL | Freq: Once | INTRAVENOUS | Status: AC | PRN
  Administered 2022-03-31: 75 mL via INTRAVENOUS

## 2022-03-31 MED ORDER — ALBUTEROL SULFATE (2.5 MG/3ML) 0.083% IN NEBU
2.5000 mg | INHALATION_SOLUTION | Freq: Once | RESPIRATORY_TRACT | Status: AC
  Administered 2022-03-31: 2.5 mg via RESPIRATORY_TRACT
  Filled 2022-03-31: qty 3

## 2022-03-31 MED ORDER — IPRATROPIUM-ALBUTEROL 0.5-2.5 (3) MG/3ML IN SOLN
3.0000 mL | Freq: Once | RESPIRATORY_TRACT | Status: AC
  Administered 2022-03-31: 3 mL via RESPIRATORY_TRACT
  Filled 2022-03-31: qty 3

## 2022-03-31 MED ORDER — ONDANSETRON 4 MG PO TBDP: 4.0000 mg | ORAL_TABLET | Freq: Three times a day (TID) | ORAL | 0 refills | Status: DC | PRN

## 2022-03-31 MED ORDER — SODIUM CHLORIDE (PF) 0.9 % IJ SOLN
INTRAMUSCULAR | Status: AC
  Filled 2022-03-31: qty 50

## 2022-03-31 MED ORDER — AMOXICILLIN 500 MG PO CAPS: 500.0000 mg | ORAL_CAPSULE | Freq: Three times a day (TID) | ORAL | 0 refills | Status: DC

## 2022-03-31 MED ORDER — ONDANSETRON HCL 4 MG/2ML IJ SOLN
4.0000 mg | Freq: Once | INTRAMUSCULAR | Status: AC
  Administered 2022-03-31: 4 mg via INTRAVENOUS
  Filled 2022-03-31: qty 2

## 2022-03-31 MED ORDER — HYDROCODONE-ACETAMINOPHEN 5-325 MG PO TABS: 1.0000 | ORAL_TABLET | ORAL | 0 refills | Status: DC | PRN

## 2022-03-31 MED ORDER — MORPHINE SULFATE (PF) 4 MG/ML IV SOLN
4.0000 mg | Freq: Once | INTRAVENOUS | Status: AC
  Administered 2022-03-31: 4 mg via INTRAVENOUS
  Filled 2022-03-31: qty 1

## 2022-03-31 MED ORDER — OXYMETAZOLINE HCL 0.05 % NA SOLN
1.0000 | Freq: Once | NASAL | Status: AC
  Administered 2022-03-31: 1 via NASAL
  Filled 2022-03-31: qty 30

## 2022-03-31 MED ORDER — METHYLPREDNISOLONE SODIUM SUCC 125 MG IJ SOLR
125.0000 mg | Freq: Once | INTRAMUSCULAR | Status: AC
  Administered 2022-03-31: 125 mg via INTRAVENOUS
  Filled 2022-03-31: qty 2

## 2022-03-31 MED ORDER — PREDNISONE 50 MG PO TABS: 50.0000 mg | ORAL_TABLET | Freq: Every day | ORAL | 0 refills | Status: DC

## 2022-03-31 NOTE — ED Triage Notes (Signed)
Patient has a history of asthma and is having expiratory wheezing tht started yesterday. Patient states she has used her Albuterol nebs and inhaler with no relief. Patient also c/o nasal congestion, chest pain and abdominal pain.

## 2022-03-31 NOTE — Telephone Encounter (Signed)
Called and spoke with patient. She stated that she has been more SOB over the past 2 days. Today has been the worst so far. She has been using her Perforomist and Pulmicort as MW suggested during the 11/15 visit. She was very SOB on the phone to the point she was difficult to understand. Denied any fevers but she has been more achy lately. I suggested that she goes to the ED. She verbalized understanding but still wanted his recommendations.   Pharmacy is [facility] on [address].   MW, can you please advise? Thanks.

## 2022-03-31 NOTE — ED Provider Triage Note (Signed)
Emergency Medicine Provider Triage Evaluation Note  [name] , a 71 y.o. female  was evaluated in triage.  Pt complains of severe expiratory wheezing and shortness of breath over the last 2 days.  Hx of asthma, CHF, and prior VTE with Hx of recent knee surgery 4 weeks ago.  Reports having much difficulty moving air.  Denies fever or chills.  Has tried her daily dual inhaler without relief.  Has not taken albuterol by itself.  Reporting increasing chest soreness, stating she feels as if she is working very hard to breathe.  Denies N/V/D, abdominal pain, headache, vision changes.  Not on anticoagulation.  Still able to swallow, though with some discomfort.  Also reports significant soreness of the throat.  Review of Systems  Positive:  Negative: See above  Physical Exam  BP (!) 153/85 (BP Location: Left Arm)   Pulse 79   Temp 98.4 F (36.9 C) (Oral)   Resp (!) 28   Ht '5\' 2"'$  (1.575 m)   Wt 71.5 kg   LMP  (LMP Unknown)   SpO2 95%   BMI 28.83 kg/m  Gen:   Awake, ill-appearing in mild distress Resp:  Increased work of breathing, tachypneic, communicates with some difficulty, significant expiratory wheezing with bilateral decreased lung sounds bilaterally MSK:   Moves extremities without difficulty  Other:  Sitting in wheelchair.  AAOx4.  Chest TTP.  Medical Decision Making  Medically screening exam initiated at 12:45 PM.  Appropriate orders placed.  [name] was informed that the remainder of the evaluation will be completed by another provider, this initial triage assessment does not replace that evaluation, and the importance of remaining in the ED until their evaluation is complete.  12:50 Discussed pt with Dr. Gilford Raid who came to assess the pt.  Plan to bring back to next available room as soon as possible.   Prince Rome, PA-C [id number]

## 2022-03-31 NOTE — Telephone Encounter (Signed)
Spoke with pt and advised her to go to ED for evaluation. Pt said she would be headed there shortly

## 2022-03-31 NOTE — ED Notes (Signed)
Patient able to ambulate in hallway with mild SOB. She was on room air and O2 stayed between 94%-100%.

## 2022-03-31 NOTE — ED Provider Notes (Signed)
[facility] DEPT Provider Note   CSN: [id number] Arrival date & time: 03/31/22  1222     History  Chief Complaint  Patient presents with   asthma   Nasal Congestion    [name] is a 71 y.o. female.  Pt is a 71 yo female with a pmhx significant for asthma, dm, htn, hld, gerd, cad, chf, and ckd.  Pt said she has been having more sob for the last 2 days.  Pt denies f/c.  Pt has tried her inhaler which has helped, but sob returned.  Pt also c/o dental pain and sinus pain and abd pain.       Home Medications Prior to Admission medications   Medication Sig Start Date End Date Taking? Authorizing Provider  amoxicillin (AMOXIL) 500 MG capsule Take 1 capsule (500 mg total) by mouth 3 (three) times daily. 03/31/22  Yes Isla Pence, MD  HYDROcodone-acetaminophen (NORCO/VICODIN) 5-325 MG tablet Take 1 tablet by mouth every 4 (four) hours as needed. 03/31/22  Yes Isla Pence, MD  ondansetron (ZOFRAN-ODT) 4 MG disintegrating tablet Take 1 tablet (4 mg total) by mouth every 8 (eight) hours as needed. 03/31/22  Yes Isla Pence, MD  predniSONE (DELTASONE) 50 MG tablet Take 1 tablet (50 mg total) by mouth daily with breakfast. 03/31/22  Yes Isla Pence, MD  albuterol (PROAIR HFA) 108 (90 Base) MCG/ACT inhaler Inhale 2 puffs into the lungs every 4 (four) hours as needed for wheezing or shortness of breath. 04/20/21   Parrett, Fonnie Mu, NP  albuterol (PROVENTIL) (2.5 MG/3ML) 0.083% nebulizer solution Take 3 mLs (2.5 mg total) by nebulization every 4 (four) hours as needed for wheezing or shortness of breath. 05/17/21   Tanda Rockers, MD  ALPRAZolam Duanne Moron) 0.5 MG tablet Take 1 tablet (0.5 mg total) by mouth 2 (two) times daily as needed for anxiety. Patient taking differently: Take 0.5 mg by mouth See admin instructions. Take 0.5 mg by mouth at bedtime and an additional 0.5 mg once a day as needed for anxiety 07/31/15   Theodis Blaze, MD  atorvastatin  (LIPITOR) 40 MG tablet Take 40 mg by mouth at bedtime.    [provider]  Biotin 5000 MCG CAPS Take 5,000 mcg by mouth daily.    [provider]  budesonide (PULMICORT) 0.5 MG/2ML nebulizer solution USE 1 VIAL IN NEBULIZER IN THE MORNING AND AT BEDTIME Patient taking differently: Take 0.5 mg by nebulization in the morning and at bedtime. 03/29/21   Tanda Rockers, MD  diltiazem (CARDIZEM CD) 360 MG 24 hr capsule Take 360 mg by mouth daily.    [provider]  divalproex (DEPAKOTE ER) 500 MG 24 hr tablet TAKE 1 TABLET BY MOUTH AT  BEDTIME Patient taking differently: Take 500 mg by mouth at bedtime. 08/08/21   Marcial Pacas, MD  ferrous sulfate 325 (65 FE) MG tablet Take 325 mg by mouth daily with breakfast. 07/15/21   [provider]  fluticasone (FLONASE) 50 MCG/ACT nasal spray Place 2 sprays into both nostrils daily as needed for allergies.    [provider]  formoterol (PERFOROMIST) 20 MCG/2ML nebulizer solution USE 1 VIAL  IN  NEBULIZER TWICE  DAILY - morning and evening Patient taking differently: Take 20 mcg by nebulization in the morning and at bedtime. 03/29/21   Tanda Rockers, MD  glipiZIDE (GLUCOTROL) 10 MG tablet Take 10 mg by mouth daily. 01/26/22   [provider]  hydrALAZINE (APRESOLINE) 100 MG  tablet TAKE 1 TABLET BY MOUTH 3 TIMES  DAILY Patient taking differently: Take 100 mg by mouth 3 (three) times daily. 07/27/21   Jerline Pain, MD  latanoprost (XALATAN) 0.005 % ophthalmic solution Place 1 drop into both eyes at bedtime. 02/05/20   [provider]  magnesium oxide (MAG-OX) 400 (240 Mg) MG tablet Take 1 tablet (400 mg total) by mouth daily. 05/14/21   Lavina Hamman, MD  memantine (NAMENDA) 10 MG tablet TAKE 1 TABLET BY MOUTH TWICE  DAILY 09/06/21   Suzzanne Cloud, NP  metFORMIN (GLUCOPHAGE) 1000 MG tablet Take 1,000 mg by mouth 2 (two) times daily with a meal.    [provider]  montelukast (SINGULAIR) 10 MG  tablet Take 1 tablet (10 mg total) by mouth at bedtime. 05/25/21   Tanda Rockers, MD  Multiple Vitamins-Minerals (ONE-A-DAY WOMENS PO) Take 1 tablet by mouth daily with breakfast.    [provider]  nebivolol (BYSTOLIC) 10 MG tablet Take 1 tablet (10 mg total) by mouth daily. Patient taking differently: Take 10 mg by mouth 2 (two) times daily. 04/10/19   Jerline Pain, MD  nitroGLYCERIN (NITROSTAT) 0.4 MG SL tablet Place 1 tablet (0.4 mg total) under the tongue every 5 (five) minutes as needed for chest pain. Don't exceed more than 3 doses in 15 mins 06/24/21   Aline August, MD  pantoprazole (PROTONIX) 40 MG tablet Take 1 tablet (40 mg total) by mouth 2 (two) times daily before a meal. 06/24/21   Aline August, MD  Probiotic Product (EQ PROBIOTIC) CAPS Take 1 capsule by mouth every morning.    [provider]  spironolactone (ALDACTONE) 25 MG tablet Take 1 tablet (25 mg total) by mouth daily. 01/26/22 01/27/23  Swinyer, Lanice Schwab, NP      Allergies    Tramadol and Chlorthalidone    Review of Systems   Review of Systems  Respiratory:  Positive for shortness of breath.   All other systems reviewed and are negative.   Physical Exam Updated Vital Signs BP (!) 153/85 (BP Location: Left Arm)   Pulse 79   Temp 98.4 F (36.9 C) (Oral)   Resp (!) 28   Ht '5\' 2"'$  (1.575 m)   Wt 71.5 kg   LMP  (LMP Unknown)   SpO2 95%   BMI 28.83 kg/m  Physical Exam Vitals and nursing note reviewed.  Constitutional:      Appearance: Normal appearance.  HENT:     Head: Normocephalic and atraumatic.     Right Ear: External ear normal.     Left Ear: External ear normal.     Nose: Nose normal.     Mouth/Throat:     Mouth: Mucous membranes are dry.  Eyes:     Extraocular Movements: Extraocular movements intact.     Conjunctiva/sclera: Conjunctivae normal.     Pupils: Pupils are equal, round, and reactive to light.  Cardiovascular:     Rate and Rhythm: Normal rate and regular rhythm.      Pulses: Normal pulses.     Heart sounds: Normal heart sounds.  Pulmonary:     Effort: Tachypnea present.     Breath sounds: Wheezing present.  Abdominal:     General: Abdomen is flat. Bowel sounds are normal.     Palpations: Abdomen is soft.     Tenderness: There is generalized abdominal tenderness.  Musculoskeletal:        General: Normal range of motion.  Cervical back: Normal range of motion and neck supple.  Skin:    General: Skin is warm.     Capillary Refill: Capillary refill takes less than 2 seconds.  Neurological:     General: No focal deficit present.     Mental Status: She is alert and oriented to person, place, and time.  Psychiatric:        Mood and Affect: Mood normal.        Behavior: Behavior normal.     ED Results / Procedures / Treatments   Labs (all labs ordered are listed, but only abnormal results are displayed) Labs Reviewed  BASIC METABOLIC PANEL - Abnormal; Notable for the following components:      Result Value   Glucose, Bld 123 (*)    BUN 30 (*)    Creatinine, Ser 1.31 (*)    GFR, Estimated 43 (*)    All other components within normal limits  CBC WITH DIFFERENTIAL/PLATELET - Abnormal; Notable for the following components:   RBC 3.71 (*)    Hemoglobin 11.8 (*)    All other components within normal limits  D-DIMER, QUANTITATIVE - Abnormal; Notable for the following components:   D-Dimer, Quant 0.65 (*)    All other components within normal limits  URINALYSIS, ROUTINE W REFLEX MICROSCOPIC - Abnormal; Notable for the following components:   Color, Urine STRAW (*)    Specific Gravity, Urine 1.032 (*)    All other components within normal limits  I-STAT CHEM 8, ED - Abnormal; Notable for the following components:   Potassium 5.3 (*)    BUN 32 (*)    Creatinine, Ser 1.30 (*)    Glucose, Bld 124 (*)    Calcium, Ion 1.10 (*)    All other components within normal limits  RESP PANEL BY RT-PCR (FLU A&B, COVID) ARPGX2  BRAIN NATRIURETIC  PEPTIDE  HEPATIC FUNCTION PANEL  LIPASE, BLOOD    EKG EKG Interpretation  Date/Time:  Friday March 31 2022 12:36:34 EST Ventricular Rate:  78 PR Interval:  171 QRS Duration: 89 QT Interval:  364 QTC Calculation: 415 R Axis:   -3 Text Interpretation: Sinus rhythm Left ventricular hypertrophy No significant change since last tracing Confirmed by Isla Pence [phone]) on 03/31/2022 1:20:17 PM  Radiology CT ABDOMEN PELVIS W CONTRAST  Result Date: 03/31/2022 CLINICAL DATA:  Abdominal pain EXAM: CT ABDOMEN AND PELVIS WITH CONTRAST TECHNIQUE: Multidetector CT imaging of the abdomen and pelvis was performed using the standard protocol following bolus administration of intravenous contrast. RADIATION DOSE REDUCTION: This exam was performed according to the departmental dose-optimization program which includes automated exposure control, adjustment of the mA and/or kV according to patient size and/or use of iterative reconstruction technique. CONTRAST:  21m OMNIPAQUE IOHEXOL 350 MG/ML SOLN COMPARISON:  CT abdomen and pelvis dated 10/04/2021 FINDINGS: Lower chest: No focal consolidation or pulmonary nodule in the lung bases. No pleural effusion or pneumothorax demonstrated. Partially imaged heart size is normal. Coronary artery calcifications. Hepatobiliary: No focal hepatic lesions. Unchanged calcified granuloma near the hepatic hilum. No intra or extrahepatic biliary ductal dilation. Normal gallbladder. Pancreas: No focal lesions or main ductal dilation. Spleen: Normal in size without focal abnormality. Adrenals/Urinary Tract: No adrenal nodules. Heterogeneously enhancing 10 mm lesion along the anterolateral left upper pole (7:9), not well seen on prior noncontrast enhanced examinations. No hydronephrosis. Similar punctate nonobstructing stone in the left kidney. No focal bladder wall thickening. Stomach/Bowel: Normal appearance of the stomach. No evidence of bowel wall thickening, distention, or  inflammatory changes. Normal appendix. Vascular/Lymphatic: Aortic atherosclerosis. No enlarged abdominal or pelvic lymph nodes. Reproductive: Status post hysterectomy. No adnexal masses. Other: No free fluid, fluid collection, or free air. Musculoskeletal: No acute or abnormal lytic or blastic osseous lesions. IMPRESSION: 1. No acute abnormality in the abdomen or pelvis. 2. Heterogeneously enhancing 10 mm lesion along the anterolateral upper pole of the left kidney, not well seen on prior noncontrast enhanced examinations. Recommend further evaluation with renal protocol MRI with and without contrast. 3. Coronary artery calcifications. Aortic Atherosclerosis (ICD10-I70.0). Electronically Signed   By: Darrin Nipper M.D.   On: 03/31/2022 16:02   CT Angio Chest PE W and/or Wo Contrast  Result Date: 03/31/2022 CLINICAL DATA:  SOB EXAM: CT ANGIOGRAPHY CHEST WITH CONTRAST TECHNIQUE: Multidetector CT imaging of the chest was performed using the standard protocol during bolus administration of intravenous contrast. Multiplanar CT image reconstructions and MIPs were obtained to evaluate the vascular anatomy. RADIATION DOSE REDUCTION: This exam was performed according to the departmental dose-optimization program which includes automated exposure control, adjustment of the mA and/or kV according to patient size and/or use of iterative reconstruction technique. CONTRAST:  75 mL OMNIPAQUE IOHEXOL 350 MG/ML SOLN COMPARISON:  06/19/2021 FINDINGS: Cardiovascular: Satisfactory opacification of the pulmonary arteries to the segmental level. No evidence of pulmonary embolism. Normal heart size. No pericardial effusion. Mediastinum/Nodes: No enlarged mediastinal, hilar, or axillary lymph nodes. Thyroid gland, trachea, and esophagus demonstrate no significant findings. Dense atheromatous calcifications observed for the coronary arteries and aorta. Lungs/Pleura: Right upper lobe 4 mm lung nodule stable compared to prior study and need  not be further evaluated with imaging unless the patient is deemed to be at high risk. Lungs are otherwise clear. No pleural or pericardial effusion. Upper Abdomen: No acute abnormality. Musculoskeletal: No chest wall abnormality. There are thoracic degenerative changes. Review of the MIP images confirms the above findings. IMPRESSION: 1. No PE, aneurysm or dissection. 2. Stable right upper lobe 4 mm nodule. 3. No acute cardiopulmonary process. Electronically Signed   By: Sammie Bench M.D.   On: 03/31/2022 15:59   DG Chest 2 View  Result Date: 03/31/2022 CLINICAL DATA:  Wheezing. EXAM: CHEST - 2 VIEW COMPARISON:  October 04, 2021 FINDINGS: The heart size and mediastinal contours are within normal limits. A coronary artery stent is in place. Both lungs are clear. A radiopaque surgical clip is seen within the soft tissues of the left breast. The visualized skeletal structures are unremarkable. IMPRESSION: No active cardiopulmonary disease. Electronically Signed   By: Virgina Norfolk M.D.   On: 03/31/2022 13:12    Procedures Procedures    Medications Ordered in ED Medications  ipratropium-albuterol (DUONEB) 0.5-2.5 (3) MG/3ML nebulizer solution 3 mL (3 mLs Nebulization Given 03/31/22 1402)  methylPREDNISolone sodium succinate (SOLU-MEDROL) 125 mg/2 mL injection 125 mg (125 mg Intravenous Given 03/31/22 1601)  albuterol (PROVENTIL) (2.5 MG/3ML) 0.083% nebulizer solution 2.5 mg (2.5 mg Nebulization Given 03/31/22 1600)  oxymetazoline (AFRIN) 0.05 % nasal spray 1 spray (1 spray Each Nare Given 03/31/22 1601)  ondansetron (ZOFRAN) injection 4 mg (4 mg Intravenous Given 03/31/22 1602)  morphine (PF) 4 MG/ML injection 4 mg (4 mg Intravenous Given 03/31/22 1601)  sodium chloride (PF) 0.9 % injection (  Given by Other 03/31/22 1541)  iohexol (OMNIPAQUE) 350 MG/ML injection 80 mL (75 mLs Intravenous Contrast Given 03/31/22 1534)    ED Course/ Medical Decision Making/ A&P  Medical  Decision Making Amount and/or Complexity of Data Reviewed Labs: ordered. Radiology: ordered.  Risk OTC drugs. Prescription drug management.   This patient presents to the ED for concern of sob, this involves an extensive number of treatment options, and is a complaint that carries with it a high risk of complications and morbidity.  The differential diagnosis includes asthma exac, pna, bronchitis, covid/flu, PE, CHF   Co morbidities that complicate the patient evaluation  asthma, dm, htn, hld, gerd, cad, chf, and ckd   Additional history obtained:  Additional history obtained from epic chart review External records from outside source obtained and reviewed including daughter   Lab Tests:  I Ordered, and personally interpreted labs.  The pertinent results include:  cbc with hgb 11.8, bmp with cr 1.31, covid/flu neg, ddimer 0.65   Imaging Studies ordered:  I ordered imaging studies including cxr and ct chest/abd/pelvis I independently visualized and interpreted imaging which showed CXR:  No active cardiopulmonary disease.  CT chest: 1. No PE, aneurysm or dissection.  2. Stable right upper lobe 4 mm nodule.  3. No acute cardiopulmonary process.  CT abd/pelvis: . No acute abnormality in the abdomen or pelvis.  2. Heterogeneously enhancing 10 mm lesion along the anterolateral  upper pole of the left kidney, not well seen on prior noncontrast  enhanced examinations. Recommend further evaluation with renal  protocol MRI with and without contrast.  3. Coronary artery calcifications. Aortic Atherosclerosis  (ICD10-I70.0).   I agree with the radiologist interpretation   Cardiac Monitoring:  The patient was maintained on a cardiac monitor.  I personally viewed and interpreted the cardiac monitored which showed an underlying rhythm of: nsr   Medicines ordered and prescription drug management:  I ordered medication including solumedrol, afrin, morphine, nebs for sob and  pain  Reevaluation of the patient after these medicines showed that the patient improved I have reviewed the patients home medicines and have made adjustments as needed   Test Considered:  CT chest/abd/pelvis   Critical Interventions:  nebs   Problem List / ED Course:  Asthma exac: pt is doing much better.  Pt able to ambulate with excellent O2 sats. Dental pain:  pt started on amox.  She is to f/u with her dentist. Left kidney cyst:  mri ordered.  Pt aware.    Reevaluation:  After the interventions noted above, I reevaluated the patient and found that they have :improved   Social Determinants of Health:  Lives at home   Dispostion:  After consideration of the diagnostic results and the patients response to treatment, I feel that the patent would benefit from discharge with outpatient f/u.          Final Clinical Impression(s) / ED Diagnoses Final diagnoses:  Mild intermittent asthma with exacerbation  Cyst of left kidney  Pain, dental    Rx / DC Orders ED Discharge Orders          Ordered    predniSONE (DELTASONE) 50 MG tablet  Daily with breakfast        03/31/22 1643    MR ABDOMEN W CONTRAST        03/31/22 1644    amoxicillin (AMOXIL) 500 MG capsule  3 times daily        03/31/22 1648    ondansetron (ZOFRAN-ODT) 4 MG disintegrating tablet  Every 8 hours PRN        03/31/22 1648    HYDROcodone-acetaminophen (NORCO/VICODIN) 5-325 MG tablet  Every 4 hours PRN  03/31/22 1648              Isla Pence, MD 03/31/22 1649

## 2022-03-31 NOTE — ED Notes (Signed)
Patient transported to CT 

## 2022-03-31 NOTE — Telephone Encounter (Signed)
Yes def go to ER

## 2022-04-10 ENCOUNTER — Other Ambulatory Visit: Payer: Self-pay

## 2022-04-10 DIAGNOSIS — N281 Cyst of kidney, acquired: Secondary | ICD-10-CM

## 2022-04-20 ENCOUNTER — Ambulatory Visit
Admission: RE | Admit: 2022-04-20 | Discharge: 2022-04-20 | Disposition: A | Discharge status: Home / Self Care | Payer: Medicare Other | Source: Ambulatory Visit | Attending: Family Medicine | Admitting: Family Medicine

## 2022-04-20 DIAGNOSIS — N281 Cyst of kidney, acquired: Secondary | ICD-10-CM

## 2022-04-20 MED ORDER — GADOBENATE DIMEGLUMINE 529 MG/ML IV SOLN
14.0000 mL | Freq: Once | INTRAVENOUS | Status: AC | PRN
  Administered 2022-04-20: 14 mL via INTRAVENOUS

## 2022-05-23 ENCOUNTER — Other Ambulatory Visit: Payer: Self-pay | Admitting: Internal Medicine

## 2022-05-23 DIAGNOSIS — J410 Simple chronic bronchitis: Secondary | ICD-10-CM

## 2022-06-02 ENCOUNTER — Telehealth: Payer: Self-pay | Admitting: Internal Medicine

## 2022-06-02 DIAGNOSIS — J4530 Mild persistent asthma, uncomplicated: Secondary | ICD-10-CM

## 2022-06-02 NOTE — Telephone Encounter (Signed)
Called and spoke with patient and she stated her current nebulizer machine is no longer working and she is needing a new one. Verified that she uses [facility]. Also asked about supplies and she stated she needed supplied too. New order sent in. Nothing further needed

## 2022-06-04 ENCOUNTER — Other Ambulatory Visit: Payer: Self-pay | Admitting: Internal Medicine

## 2022-06-04 DIAGNOSIS — J4550 Severe persistent asthma, uncomplicated: Secondary | ICD-10-CM

## 2022-06-04 DIAGNOSIS — J301 Allergic rhinitis due to pollen: Secondary | ICD-10-CM

## 2022-06-05 ENCOUNTER — Telehealth: Payer: Self-pay | Admitting: Internal Medicine

## 2022-06-05 NOTE — Telephone Encounter (Signed)
ATC X1 No one answered at [facility] I was on hold for about 33mnutes or so. Will attempt to call back later

## 2022-06-07 NOTE — Telephone Encounter (Signed)
ATC LVMTCB x 1 PCCs please give update on nebulizer machine. Referral is in place.

## 2022-06-09 NOTE — Telephone Encounter (Signed)
Called and spoke with pt stating to her that she would need to discuss with [facility] to see if there is anything that they could get worked out for her since her current machine is not working and she verbalized understanding. Nothing further needed.

## 2022-07-09 ENCOUNTER — Other Ambulatory Visit: Payer: Self-pay | Admitting: Cardiology

## 2022-07-14 ENCOUNTER — Ambulatory Visit: Payer: Medicare Other | Admitting: Cardiology

## 2022-08-03 NOTE — Progress Notes (Signed)
Cardiology Clinic Note   Date: 08/04/2022 ID: [name], DOB 08/02/1950, MRN [id number]  Primary Cardiologist:  Donato SchultzMark Skains, MD  Patient Profile    [name] is a 72 y.o. female who presents to the clinic today for 2143-month follow-up.  Past medical history significant for: CAD.  LHC 01/18/2016 (NSTEMI): Mid RCA 70%, distal RCA 90%, proximal RCA 80%.  Ostial to proximal Cx 10%.  Mid LAD 10%.  PCI with DES x 2 mid and distal RCA. Nuclear stress test 02/06/2022: Normal low risk study.  Normal LV perfusion and left ventricular function. Chronic diastolic heart failure. Echo 08/13/2017: EF 65 to 70%.  Grade II DD.  Trivial MR. Hypertension. Hyperlipidemia.  Lipid panel 01/10/2022: LDL 71, HDL 60, TG 115, total 152. T2DM. COPD. Seizures. GERD. CKD stage III.   History of Present Illness    [name] was first evaluated by Dr. Anne FuSkains on 01/18/2016 during hospitalization.  Patient presented to the ED on 01/16/2016 with complaints of shortness of breath and associated chest tightness attributed to asthma exacerbation.  Elevated troponin x 3.  Patient underwent PCI with DES x 2 (as detailed above).  Patient was last seen in the office on 01/26/2022 by Eligha BridegroomMichelle Swinyer, NP for risk assessment pending knee surgery.  Patient complained of DOE that could potentially correlate with angina.  She underwent nuclear stress testing which was normal and she was approved for surgery.  She was slightly hypertensive in the office and spironolactone was added as well as referral to advanced hypertension clinic.  Today, patient is accompanied by her daughter. Patient denies shortness of breath or dyspnea on exertion. No chest pain, pressure, or tightness. Denies lower extremity edema, orthopnea, or PND. No palpitations.  Patient reports BP typically runs around 135/70 when she checks it at home.  She denies headaches, dizziness, or vision changes.  She has not been following a walking program since having  knee surgery in October 2023.  She did work with physical therapy after surgery and has done well since.  She does get some exercise "chasing after my great grandkids."  She is now being followed by a nephrologist for CKD.  She does not always restrict dietary sodium.  She reports she is under a lot of stress secondary to her husband's diagnosis of cancer.  She was recently started on medication by her PCP (she cannot remember the name) and feels it may be helping somewhat.  She has only been on it for approximately 4 weeks.   ROS: All other systems reviewed and are otherwise negative except as noted in History of Present Illness.  Studies Reviewed    ECG is not ordered today.         Physical Exam    VS:  BP 138/66   Pulse 68   Ht 5\' 2"  (1.575 m)   Wt 146 lb (66.2 kg)   LMP  (LMP Unknown)   BMI 26.70 kg/m  , BMI Body mass index is 26.7 kg/m.  GEN: Well nourished, well developed, in no acute distress. Neck: No JVD or carotid bruits. Cardiac:  RRR. No murmurs. No rubs or gallops.   Respiratory:  Respirations regular and unlabored. Clear to auscultation without rales, wheezing or rhonchi. GI: Soft, nontender, nondistended. Extremities: Radials/DP/PT 2+ and equal bilaterally. No clubbing or cyanosis. No edema.  Skin: Warm and dry, no rash. Neuro: Strength intact.  Assessment & Plan   CAD. S/p PCI DES x 2 mid and distal RCA September  2017. Normal nuclear stress test October 2023. Patient denies chest pain, pressure, tightness.  Encourage patient to restart daily walking for exercise.  She agrees to do so.  Continue atorvastatin, Bystolic, as needed SL NTG.  Chronic diastolic heart failure. Echo April 2019 showed EF 65-70%, grade II DD. Patient denies DOE, lower extremity edema, orthopnea, or PND. Euvolemic and well compensated on exam.  Continue Bystolic and hydralazine. Hypertension. BP today 138/66 at intake and 132/68 at recheck.  Patient denies vision changes, headaches or  dizziness. Continue diltiazem, hydralazine, Bystolic, and spironolactone. Hyperlipidemia.  LDL September 2023 71, at goal.  Continue atorvastatin.  Disposition: Return in 6 months or sooner as needed.        Signed, Etta Grandchildeborah M. Syeda Prickett, DNP, NP-C

## 2022-08-04 ENCOUNTER — Encounter: Payer: Self-pay | Admitting: Student

## 2022-08-04 ENCOUNTER — Ambulatory Visit: Payer: Medicare Other | Attending: Cardiology | Admitting: Student

## 2022-08-04 VITALS — BP 138/66 | HR 68 | Ht 62.0 in | Wt 146.0 lb

## 2022-08-04 DIAGNOSIS — I2510 Atherosclerotic heart disease of native coronary artery without angina pectoris: Secondary | ICD-10-CM

## 2022-08-04 DIAGNOSIS — E785 Hyperlipidemia, unspecified: Secondary | ICD-10-CM | POA: Diagnosis not present

## 2022-08-04 DIAGNOSIS — I5032 Chronic diastolic (congestive) heart failure: Secondary | ICD-10-CM | POA: Diagnosis not present

## 2022-08-04 DIAGNOSIS — I10 Essential (primary) hypertension: Secondary | ICD-10-CM

## 2022-08-04 NOTE — Patient Instructions (Signed)
Medication Instructions:  Your physician recommends that you continue on your current medications as directed. Please refer to the Current Medication list given to you today.   *If you need a refill on your cardiac medications before your next appointment, please call your pharmacy*   Lab Work: None ordered     Testing/Procedures: None ordered   Follow-Up: At [facility], you and your health needs are our priority.  As part of our continuing mission to provide you with exceptional heart care, we have created designated Provider Care Teams.  These Care Teams include your primary Cardiologist (physician) and Advanced Practice Providers (APPs -  Physician Assistants and Nurse Practitioners) who all work together to provide you with the care you need, when you need it.  We recommend signing up for the patient portal called "MyChart".  Sign up information is provided on this After Visit Summary.  MyChart is used to connect with patients for Virtual Visits (Telemedicine).  Patients are able to view lab/test results, encounter notes, upcoming appointments, etc.  Non-urgent messages can be sent to your provider as well.   To learn more about what you can do with MyChart, go to [email or web address].    Your next appointment:   6 month(s)  Provider:   Donato Schultz, MD

## 2022-08-07 NOTE — Progress Notes (Unsigned)
HISTORY OF PRESENT ILLNESS:YY Jeani SowHilda H Olthoff is a 72 years old left-handed female, seen in refer by her primary care doctor Johny BlamerWilliam Harris for evaluation of episodes of confusion on January 27 2016.  I reviewed and summarized the referring note, she had a history of type 2 diabetes, hypertension, hyperlipidemia, pulmonary emboli and, depression, COPD, coronary artery disease, stent placement following a heart attack in January 18 2016, I reviewed echo report on January 18 2016, ejection fraction 65-70%, severe concentric hypertrophy, septal wall thickness was increased, with mild hypertrophy of the posterior wall, abnormal relaxation, increased filling pressure.  Laboratory evaluation BMP showed elevated glucose 270, creatinine 1.06, cholesterol 207, LDL 110, A1c 7.0,   She had her first generalized seizure in 2011, was taken to [facility] evaluated by neurologist there, per patient, there was a suggestion of antiepileptic medications, however it was never followed through.   Over the years, she had no generalized seizure, but every few weeks to every few months, she would have episodes of sudden onset confusion, space out, lasting less than 5 minutes   She went through extreme stress (her grandson died) in January 2017, since then, she began to have increased spells, almost daily basis now, she noted when she was reading, cooking, has transient time elapsed, she came to confused, sometimes preceded by nausea dry mouth sensation. 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   UPDATE May 11 2016:YY She is now taking Depakote ER 500 mg every night, which has helped her tremendously, she no longer has recurrent spells,  EEG was normal in October 2017  We have personally reviewed MRI of the brain in November 2017, multiple periventricular and subcortical punctate scattered chronic small vessel disease, increased T2 flare hyperintensity in  the mesial temporal lobe, right hippocampus is slightly smaller than the left,   Update March 20, 2019: She had no recurrent seizure, last seizure was in 2016, tolerating Depakote ER 500 mg 2 tablets at nighttime,   Today her main concern is intermittent memory loss, she retired as a Journalist, 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   UPDATE August 2021: She retired recently, no recurrent seizure, personally reviewed MRI of the brain without contrast, generalized atrophy, supratentorial small vessel disease, no change compared to previous scan in 2017, ORI g or due to elevated intracranial pressures.  It appears stable compared to the previous MRI. Laboratory evaluation May 2021, A1c 6.5, BMP showed elevated creatinine 1.5, glucose 307, potassium 3.3, CBC showed mild decreased hemoglobin of 11.1, November 2020, normal TSH, B12, RPR, depakote level 80s.  While taking Depakote ER 500 mg 2 every night  She has some memory complaints, was put on Namenda 10 mg twice a day tolerating it well  Update August 26, 2020 SS: Doing well on lower dose Depakote ER 500 mg at bedtime, no seizures. MMSE 26/30, doing well on Namenda 10 mg twice daily. Living with husband. Driving car, doing everything for herself. For memory, sometimes can't find the words. In hospital in Feb, had COVID, asthma flare. Depakote level was 34 in Feb, creatinine 1.56. No complaints today.   Update Sep 01, 2021 SS: MMSE 27/30. Rough year, since Dec 2022 in hospital 8 times for asthma. Husband diagnosed with stomach cancer, had recent stroke, he is at home. She just got discharged 1 week ago for asthma. Driving,  feels memory is doing good, feels mentally exhausted. No seizures, only the 1 in 2017, remains on Depakote ER 500 mg at bedtime, on Namenda 10 mg twice daily (since 2021).   Update August 08, 2022 SS: MMSE 24/30, has had rough year, her  husband is not doing well with stomach cancer. No major changes in memory. Still very active taking care of herself and family. No seizures, still on Depakote ER 500 mg at bedtime. On Namenda 10 mg BID.   REVIEW OF SYSTEMS: Out of a complete 14 system review of symptoms, the patient complains only of the following symptoms, and all other reviewed systems are negative.  See HPI  ALLERGIES: Allergies  Allergen Reactions   Tramadol Other (See Comments)    Not to be taken because of existing seizure disorder/SEIZURES    Chlorthalidone Other (See Comments)    Was stopped due to kidney function    HOME MEDICATIONS: Outpatient Medications Prior to Visit  Medication Sig Dispense Refill   albuterol (PROAIR HFA) 108 (90 Base) MCG/ACT inhaler Inhale 2 puffs into the lungs every 4 (four) hours as needed for wheezing or shortness of breath. 18 g 2   albuterol (PROVENTIL) (2.5 MG/3ML) 0.083% nebulizer solution Take 3 mLs (2.5 mg total) by nebulization every 4 (four) hours as needed for wheezing or shortness of breath. 75 mL 12   ALPRAZolam (XANAX) 0.5 MG tablet Take 1 tablet (0.5 mg total) by mouth 2 (two) times daily as needed for anxiety. (Patient taking differently: Take 0.5 mg by mouth See admin instructions. Take 0.5 mg by mouth at bedtime and an additional 0.5 mg once a day as needed for anxiety) 30 tablet 0   atorvastatin (LIPITOR) 40 MG tablet Take 40 mg by mouth at bedtime.     Biotin 5000 MCG CAPS Take 5,000 mcg by mouth daily.     budesonide (PULMICORT) 0.5 MG/2ML nebulizer solution USE 1 VIAL IN NEBULIZER IN THE MORNING AND AT BEDTIME (Patient taking differently: Take 0.5 mg by nebulization in the morning and at bedtime.) 120 mL 11   diltiazem (CARDIZEM CD) 360 MG 24 hr capsule Take 360 mg by mouth daily.     divalproex (DEPAKOTE ER) 500 MG 24 hr tablet TAKE 1 TABLET BY MOUTH AT  BEDTIME (Patient taking differently: Take 500 mg by mouth at bedtime.) 90 tablet 3   ferrous sulfate 325 (65 FE)  MG tablet Take 325 mg by mouth daily with breakfast.     formoterol (PERFOROMIST) 20 MCG/2ML nebulizer solution USE 1 VIAL VIA NEBULIZER TWICE  DAILY IN THE MORNING AND EVENING 360 mL 3   hydrALAZINE (APRESOLINE) 100 MG tablet TAKE 1 TABLET BY MOUTH 3 TIMES  DAILY 270 tablet 1   latanoprost (XALATAN) 0.005 % ophthalmic solution Place 1 drop into both eyes at bedtime.     magnesium oxide (MAG-OX) 400 (240 Mg) MG tablet Take 1 tablet (400 mg total) by mouth daily. 60 tablet 0   memantine (NAMENDA) 10 MG tablet TAKE 1 TABLET BY MOUTH TWICE  DAILY 180 tablet 3   metFORMIN (GLUCOPHAGE) 1000 MG tablet Take 1,000 mg by mouth 2 (two) times daily with a meal.     montelukast (SINGULAIR) 10 MG tablet TAKE 1 TABLET BY MOUTH AT  BEDTIME 90 tablet 3   Multiple Vitamins-Minerals (ONE-A-DAY WOMENS PO) Take 1 tablet by mouth daily with breakfast.     nebivolol (BYSTOLIC) 10 MG tablet Take 1 tablet (10 mg total) by mouth daily. (Patient taking differently:  Take 10 mg by mouth 2 (two) times daily.) 34 tablet 11   nitroGLYCERIN (NITROSTAT) 0.4 MG SL tablet Place 1 tablet (0.4 mg total) under the tongue every 5 (five) minutes as needed for chest pain. Don't exceed more than 3 doses in 15 mins 30 tablet 0   pantoprazole (PROTONIX) 40 MG tablet Take 1 tablet (40 mg total) by mouth 2 (two) times daily before a meal. 60 tablet 0   Probiotic Product (EQ PROBIOTIC) CAPS Take 1 capsule by mouth every morning.     spironolactone (ALDACTONE) 25 MG tablet Take 1 tablet (25 mg total) by mouth daily. 30 tablet 11   No facility-administered medications prior to visit.    PAST MEDICAL HISTORY: Past Medical History:  Diagnosis Date   Anxiety    Arthritis    Asthma    Chronic diastolic CHF (congestive heart failure) 07/17/2016   Coronary artery disease    a. NSTEMI 12/2015 - LHC 01/18/16: s/p overlapping DESx2 to RCA, 10% ost-prox Cx,10% mLAD.   Dementia    Depression    Diabetes mellitus    Dyslipidemia    GERD  (gastroesophageal reflux disease)    History of seizure    Hypertension    Hypertensive heart disease    NSTEMI (non-ST elevated myocardial infarction) 12/2015   Stage 3 chronic kidney disease     PAST SURGICAL HISTORY: Past Surgical History:  Procedure Laterality Date   ABDOMINAL HYSTERECTOMY     CARDIAC CATHETERIZATION  1994   minimal LAD dz, no other dz, EF normal   CARDIAC CATHETERIZATION N/A 01/18/2016   Procedure: Left Heart Cath and Coronary Angiography;  Surgeon: Kathleene Hazel, MD;  [facility] IN[facility]AS[facility] CV LAB;  Service: Cardiovascular;  Laterality: N/A;   CARDIAC CATHETERIZATION N/A 01/18/2016   Procedure: Coronary Stent Intervention;  Surgeon: Kathleene Hazel, MD;  Location: [facility] INVASIVE CV LAB;  Service: Cardiovascular;  Laterality: N/A;   COLONOSCOPY WITH PROPOFOL N/A 04/09/2014   Procedure: COLONOSCOPY WITH PROPOFOL;  Surgeon: Florencia Reasons, MD;  Location: [facility] ENDOSCOPY;  Service: Endoscopy;  Laterality: N/A;   CORONARY STENT PLACEMENT  01/19/2016   STENT SYNERGY DES 1.70Y17 drug eluting stent was successfully placed, and overlaps the 2.5 x 38 mm Synergy stent placed distally.   ESOPHAGOGASTRODUODENOSCOPY (EGD) WITH PROPOFOL N/A 04/09/2014   Procedure: ESOPHAGOGASTRODUODENOSCOPY (EGD) WITH PROPOFOL;  Surgeon: Florencia Reasons, MD;  Location: [facility] ENDOSCOPY;  Service: Endoscopy;  Laterality: N/A;   FOOT SURGERY Bilateral    approx ~2000   HEMORRHOID SURGERY     HERNIA REPAIR     KNEE ARTHROSCOPY     RADIOACTIVE SEED GUIDED EXCISIONAL BREAST BIOPSY Left 01/20/2021   Procedure: RADIOACTIVE SEED GUIDED EXCISIONAL LEFT BREAST BIOPSY;  Surgeon: Almond Lint, MD;  Location: [facility] OR;  Service: General;  Laterality: Left;    FAMILY HISTORY: Family History  Problem Relation Age of Onset   Allergies Mother    Asthma Mother    CAD Father 6   Diabetes Father    Hypertension Father    Congestive Heart Failure Sister        died in her 82s   CAD Sister 44     SOCIAL HISTORY: Social History   Socioeconomic History   Marital status: Married    Spouse name: Not on file   Number of children: 3   Years of education: some college   Highest education level: Not on file  Occupational History   Occupation: Occupational psychologist  Tobacco Use  Smoking status: Former    Packs/day: 1.00    Years: 30.00    Additional pack years: 0.00    Total pack years: 30.00    Types: Cigarettes    Quit date: 01/09/2004    Years since quitting: 18.5   Smokeless tobacco: Never  Vaping Use   Vaping Use: Never used  Substance and Sexual Activity   Alcohol use: No    Alcohol/week: 0.0 standard drinks of alcohol   Drug use: No   Sexual activity: Not on file  Other Topics Concern   Not on file  Social History Narrative   Pt lives with husband in [place].   Right-handed.   2 cups caffeine per day.   Social Determinants of Health   Financial Resource Strain: Not on file  Food Insecurity: Not on file  Transportation Needs: Not on file  Physical Activity: Not on file  Stress: Not on file  Social Connections: Not on file  Intimate Partner Violence: Not on file   PHYSICAL EXAM  Vitals:   08/08/22 1549 08/08/22 1559  BP: (!) 144/75 (!) 152/74  Pulse: (!) 58 (!) 58  Weight: 150 lb (68 kg)   Height: 5\' 2"  (1.575 m)      Body mass index is 27.44 kg/m.  Generalized: Well developed, in no acute distress     08/08/2022    3:53 PM 09/01/2021   11:14 AM 08/26/2020    9:15 AM  MMSE - Mini Mental State Exam  Orientation to time 5 5 5   Orientation to Place 5 5 5   Registration 3 3 3   Attention/ Calculation 2 3 3   Recall 1 2 1   Language- name 2 objects 2 2 2   Language- repeat 1 1 1   Language- follow 3 step command 2 3 3   Language- read & follow direction 1 1 1   Write a sentence 1 1 1   Copy design 1 1 1   Total score 24 27 26     NEUROLOGICAL EXAM:  MENTAL STATUS: Speech/Cognition: Awake, alert, normal speech, oriented to history taking and casual  conversation.  CRANIAL NERVES: CN II: Visual fields are full to confrontation.  Pupils are round equal and briskly reactive to light. CN III, IV, VI: extraocular movement are normal. No ptosis. CN V: Facial sensation is intact to light touch. CN VII: Face is symmetric with normal eye closure and smile. CN VIII: Hearing is normal to casual conversation CN XI: Head turning and shoulder shrug are intact  MOTOR: Good strength all extremities  REFLEXES: Reflexes are 2+ throughout  SENSORY: Intact to light touch to all extremities  COORDINATION: Finger-nose-finger and heel-to-shin is normal bilaterally  GAIT/STANCE: Gait is normal, independent  DIAGNOSTIC DATA (LABS, IMAGING, TESTING) - I reviewed patient records, labs, notes, testing and imaging myself where available.  Lab Results  Component Value Date   WBC 10.1 03/31/2022   HGB 12.9 03/31/2022   HCT 38.0 03/31/2022   MCV 99.5 03/31/2022   PLT 335 03/31/2022      Component Value Date/Time   NA 140 03/31/2022 1409   NA 136 02/06/2022 1201   K 5.3 (H) 03/31/2022 1409   CL 108 03/31/2022 1409   CO2 22 03/31/2022 1350   GLUCOSE 124 (H) 03/31/2022 1409   BUN 32 (H) 03/31/2022 1409   BUN 31 (H) 02/06/2022 1201   CREATININE 1.30 (H) 03/31/2022 1409   CREATININE 1.06 (H) 01/26/2016 1608   CALCIUM 9.7 03/31/2022 1350   PROT 7.3 10/04/2021 1520   PROT  7.6 05/17/2017 1141   ALBUMIN 4.2 10/04/2021 1520   ALBUMIN 4.3 05/17/2017 1141   AST 18 10/04/2021 1520   ALT 17 10/04/2021 1520   ALKPHOS 62 10/04/2021 1520   BILITOT 0.6 10/04/2021 1520   BILITOT 0.4 05/17/2017 1141   GFRNONAA 43 (L) 03/31/2022 1350   GFRAA 50 (L) 02/26/2020 1350   Lab Results  Component Value Date   CHOL 196 07/18/2016   HDL 48 07/18/2016   LDLCALC 107 (H) 07/18/2016   TRIG 203 (H) 07/18/2016   CHOLHDL 4.1 07/18/2016   Lab Results  Component Value Date   HGBA1C 8.2 (H) 06/05/2021   Lab Results  Component Value Date   VITAMINB12 766  05/14/2021   Lab Results  Component Value Date   TSH 0.622 05/13/2021   ASSESSMENT AND PLAN 72 y.o. year old female   1. Seizure, none since 2017 -No recent seizures, continue Depakote ER 500 mg at bedtime -First generalized seizure was in 2011, multiple recurrent seizure-like spells since 2017-02-11following sudden death of family member, was under a lot of stress at that time -Reviewed CBC, CMP over the last year    2.  Mild cognitive impairment -MMSE was  24/30 (27/30) -Continue Namenda 10 mg twice a day -Laboratory evaluation showed no treatable etiology -MRI of the brain showed moderate supratentorium small vessel disease -Follow-up 1 year or sooner if needed, continue exercise, brain stimulating activity  Margie Ege, Edrick Oh, DNP  [facility] [address], [address] [place], [place] [address] [phone]

## 2022-08-08 ENCOUNTER — Ambulatory Visit (INDEPENDENT_AMBULATORY_CARE_PROVIDER_SITE_OTHER): Payer: Medicare Other | Admitting: Neurology

## 2022-08-08 ENCOUNTER — Encounter: Payer: Self-pay | Admitting: Neurology

## 2022-08-08 VITALS — BP 152/74 | HR 58 | Ht 62.0 in | Wt 150.0 lb

## 2022-08-08 DIAGNOSIS — G3184 Mild cognitive impairment, so stated: Secondary | ICD-10-CM

## 2022-08-08 DIAGNOSIS — R569 Unspecified convulsions: Secondary | ICD-10-CM

## 2022-08-08 MED ORDER — DIVALPROEX SODIUM ER 500 MG PO TB24: 500.0000 mg | ORAL_TABLET | Freq: Every day | ORAL | 3 refills | Status: DC

## 2022-08-08 MED ORDER — MEMANTINE HCL 10 MG PO TABS: 10.0000 mg | ORAL_TABLET | Freq: Two times a day (BID) | ORAL | 3 refills | Status: DC

## 2022-08-10 ENCOUNTER — Encounter (HOSPITAL_COMMUNITY): Payer: Self-pay

## 2022-08-10 ENCOUNTER — Inpatient Hospital Stay (HOSPITAL_COMMUNITY)
Admission: EM | Admit: 2022-08-10 | Discharge: 2022-08-14 | DRG: 324 | Disposition: A | Discharge status: Home / Self Care | Payer: Medicare Other | Attending: Internal Medicine | Admitting: Internal Medicine

## 2022-08-10 ENCOUNTER — Emergency Department (HOSPITAL_COMMUNITY): Payer: Medicare Other

## 2022-08-10 ENCOUNTER — Encounter (HOSPITAL_COMMUNITY)
Admission: EM | Disposition: A | Discharge status: Home / Self Care | Payer: Self-pay | Source: Home / Self Care | Attending: Internal Medicine

## 2022-08-10 ENCOUNTER — Other Ambulatory Visit: Payer: Self-pay

## 2022-08-10 ENCOUNTER — Inpatient Hospital Stay (HOSPITAL_COMMUNITY): Payer: Medicare Other

## 2022-08-10 DIAGNOSIS — I5032 Chronic diastolic (congestive) heart failure: Secondary | ICD-10-CM | POA: Diagnosis present

## 2022-08-10 DIAGNOSIS — I3139 Other pericardial effusion (noninflammatory): Secondary | ICD-10-CM | POA: Insufficient documentation

## 2022-08-10 DIAGNOSIS — Z833 Family history of diabetes mellitus: Secondary | ICD-10-CM

## 2022-08-10 DIAGNOSIS — I319 Disease of pericardium, unspecified: Secondary | ICD-10-CM | POA: Insufficient documentation

## 2022-08-10 DIAGNOSIS — F419 Anxiety disorder, unspecified: Secondary | ICD-10-CM | POA: Diagnosis present

## 2022-08-10 DIAGNOSIS — I2111 ST elevation (STEMI) myocardial infarction involving right coronary artery: Secondary | ICD-10-CM

## 2022-08-10 DIAGNOSIS — I213 ST elevation (STEMI) myocardial infarction of unspecified site: Secondary | ICD-10-CM | POA: Diagnosis present

## 2022-08-10 DIAGNOSIS — N1831 Chronic kidney disease, stage 3a: Secondary | ICD-10-CM | POA: Diagnosis present

## 2022-08-10 DIAGNOSIS — R079 Chest pain, unspecified: Secondary | ICD-10-CM

## 2022-08-10 DIAGNOSIS — Z888 Allergy status to other drugs, medicaments and biological substances status: Secondary | ICD-10-CM

## 2022-08-10 DIAGNOSIS — I2584 Coronary atherosclerosis due to calcified coronary lesion: Secondary | ICD-10-CM | POA: Diagnosis present

## 2022-08-10 DIAGNOSIS — I252 Old myocardial infarction: Secondary | ICD-10-CM

## 2022-08-10 DIAGNOSIS — Z79899 Other long term (current) drug therapy: Secondary | ICD-10-CM | POA: Diagnosis not present

## 2022-08-10 DIAGNOSIS — E1122 Type 2 diabetes mellitus with diabetic chronic kidney disease: Secondary | ICD-10-CM | POA: Diagnosis present

## 2022-08-10 DIAGNOSIS — Z1152 Encounter for screening for COVID-19: Secondary | ICD-10-CM

## 2022-08-10 DIAGNOSIS — Z7984 Long term (current) use of oral hypoglycemic drugs: Secondary | ICD-10-CM

## 2022-08-10 DIAGNOSIS — J45909 Unspecified asthma, uncomplicated: Secondary | ICD-10-CM | POA: Diagnosis present

## 2022-08-10 DIAGNOSIS — I10 Essential (primary) hypertension: Secondary | ICD-10-CM | POA: Diagnosis not present

## 2022-08-10 DIAGNOSIS — Z955 Presence of coronary angioplasty implant and graft: Secondary | ICD-10-CM | POA: Diagnosis not present

## 2022-08-10 DIAGNOSIS — K224 Dyskinesia of esophagus: Secondary | ICD-10-CM | POA: Diagnosis not present

## 2022-08-10 DIAGNOSIS — F32A Depression, unspecified: Secondary | ICD-10-CM | POA: Diagnosis present

## 2022-08-10 DIAGNOSIS — Z87891 Personal history of nicotine dependence: Secondary | ICD-10-CM | POA: Diagnosis not present

## 2022-08-10 DIAGNOSIS — E1165 Type 2 diabetes mellitus with hyperglycemia: Secondary | ICD-10-CM | POA: Diagnosis present

## 2022-08-10 DIAGNOSIS — I9751 Accidental puncture and laceration of a circulatory system organ or structure during a circulatory system procedure: Secondary | ICD-10-CM | POA: Diagnosis not present

## 2022-08-10 DIAGNOSIS — I2510 Atherosclerotic heart disease of native coronary artery without angina pectoris: Secondary | ICD-10-CM | POA: Diagnosis present

## 2022-08-10 DIAGNOSIS — J4542 Moderate persistent asthma with status asthmaticus: Secondary | ICD-10-CM

## 2022-08-10 DIAGNOSIS — K219 Gastro-esophageal reflux disease without esophagitis: Secondary | ICD-10-CM | POA: Diagnosis present

## 2022-08-10 DIAGNOSIS — E785 Hyperlipidemia, unspecified: Secondary | ICD-10-CM | POA: Diagnosis present

## 2022-08-10 DIAGNOSIS — Y840 Cardiac catheterization as the cause of abnormal reaction of the patient, or of later complication, without mention of misadventure at the time of the procedure: Secondary | ICD-10-CM | POA: Diagnosis not present

## 2022-08-10 DIAGNOSIS — D649 Anemia, unspecified: Secondary | ICD-10-CM

## 2022-08-10 DIAGNOSIS — R1310 Dysphagia, unspecified: Secondary | ICD-10-CM

## 2022-08-10 DIAGNOSIS — F0390 Unspecified dementia without behavioral disturbance: Secondary | ICD-10-CM | POA: Diagnosis present

## 2022-08-10 DIAGNOSIS — Z8249 Family history of ischemic heart disease and other diseases of the circulatory system: Secondary | ICD-10-CM

## 2022-08-10 DIAGNOSIS — I2119 ST elevation (STEMI) myocardial infarction involving other coronary artery of inferior wall: Secondary | ICD-10-CM | POA: Diagnosis not present

## 2022-08-10 DIAGNOSIS — Z825 Family history of asthma and other chronic lower respiratory diseases: Secondary | ICD-10-CM | POA: Diagnosis not present

## 2022-08-10 DIAGNOSIS — I130 Hypertensive heart and chronic kidney disease with heart failure and stage 1 through stage 4 chronic kidney disease, or unspecified chronic kidney disease: Secondary | ICD-10-CM | POA: Diagnosis present

## 2022-08-10 DIAGNOSIS — E119 Type 2 diabetes mellitus without complications: Secondary | ICD-10-CM

## 2022-08-10 DIAGNOSIS — Z90710 Acquired absence of both cervix and uterus: Secondary | ICD-10-CM

## 2022-08-10 DIAGNOSIS — I214 Non-ST elevation (NSTEMI) myocardial infarction: Principal | ICD-10-CM

## 2022-08-10 HISTORY — PX: CORONARY STENT INTERVENTION: CATH118234

## 2022-08-10 HISTORY — PX: CORONARY/GRAFT ACUTE MI REVASCULARIZATION: CATH118305

## 2022-08-10 HISTORY — PX: LEFT HEART CATH AND CORONARY ANGIOGRAPHY: CATH118249

## 2022-08-10 HISTORY — PX: CORONARY LITHOTRIPSY: CATH118330

## 2022-08-10 LAB — GLUCOSE, CAPILLARY
Glucose-Capillary: 236 mg/dL — ABNORMAL HIGH (ref 70–99)
Glucose-Capillary: 189 mg/dL — ABNORMAL HIGH (ref 70–99)
Glucose-Capillary: 215 mg/dL — ABNORMAL HIGH (ref 70–99)

## 2022-08-10 LAB — MRSA NEXT GEN BY PCR, NASAL: MRSA by PCR Next Gen: NOT DETECTED

## 2022-08-10 LAB — CBC WITH DIFFERENTIAL/PLATELET
Abs Immature Granulocytes: 0 10*3/uL (ref 0.00–0.07)
Basophils Absolute: 0.3 10*3/uL — ABNORMAL HIGH (ref 0.0–0.1)
Basophils Relative: 3 %
Eosinophils Absolute: 0 10*3/uL (ref 0.0–0.5)
Eosinophils Relative: 0 %
HCT: 36.5 % (ref 36.0–46.0)
Hemoglobin: 11.8 g/dL — ABNORMAL LOW (ref 12.0–15.0)
Lymphocytes Relative: 51 %
Lymphs Abs: 5.9 10*3/uL — ABNORMAL HIGH (ref 0.7–4.0)
MCH: 32.7 pg (ref 26.0–34.0)
MCHC: 32.3 g/dL (ref 30.0–36.0)
MCV: 101.1 fL — ABNORMAL HIGH (ref 80.0–100.0)
Monocytes Absolute: 0.8 10*3/uL (ref 0.1–1.0)
Monocytes Relative: 7 %
Neutro Abs: 4.5 10*3/uL (ref 1.7–7.7)
Neutrophils Relative %: 39 %
Platelets: 271 10*3/uL (ref 150–400)
RBC: 3.61 MIL/uL — ABNORMAL LOW (ref 3.87–5.11)
RDW: 12.6 % (ref 11.5–15.5)
WBC: 11.6 10*3/uL — ABNORMAL HIGH (ref 4.0–10.5)
nRBC: 0 % (ref 0.0–0.2)
nRBC: 0 /100 WBC

## 2022-08-10 LAB — ECHOCARDIOGRAM COMPLETE
AR max vel: 1.95 cm2
AV Area VTI: 1.99 cm2
AV Area mean vel: 1.92 cm2
AV Mean grad: 4 mmHg
AV Peak grad: 7.2 mmHg
Ao pk vel: 1.34 m/s
Area-P 1/2: 4.31 cm2
Calc EF: 56.2 %
Height: 62 in
S' Lateral: 1.9 cm
Single Plane A2C EF: 53 %
Single Plane A4C EF: 57.5 %
Weight: 2432 oz

## 2022-08-10 LAB — I-STAT ARTERIAL BLOOD GAS, ED
Acid-base deficit: 4 mmol/L — ABNORMAL HIGH (ref 0.0–2.0)
Bicarbonate: 21 mmol/L (ref 20.0–28.0)
Calcium, Ion: 1.29 mmol/L (ref 1.15–1.40)
HCT: 33 % — ABNORMAL LOW (ref 36.0–46.0)
Hemoglobin: 11.2 g/dL — ABNORMAL LOW (ref 12.0–15.0)
O2 Saturation: 100 %
Patient temperature: 98.2
Potassium: 3.8 mmol/L (ref 3.5–5.1)
Sodium: 139 mmol/L (ref 135–145)
TCO2: 22 mmol/L (ref 22–32)
pCO2 arterial: 35.7 mmHg (ref 32–48)
pH, Arterial: 7.376 (ref 7.35–7.45)
pO2, Arterial: 176 mmHg — ABNORMAL HIGH (ref 83–108)

## 2022-08-10 LAB — POCT ACTIVATED CLOTTING TIME
Activated Clotting Time: 255 seconds
Activated Clotting Time: 260 seconds
Activated Clotting Time: 369 seconds
Activated Clotting Time: 552 seconds

## 2022-08-10 LAB — BASIC METABOLIC PANEL
Anion gap: 11 (ref 5–15)
BUN: 23 mg/dL (ref 8–23)
CO2: 22 mmol/L (ref 22–32)
Calcium: 9.5 mg/dL (ref 8.9–10.3)
Chloride: 104 mmol/L (ref 98–111)
Creatinine, Ser: 1.49 mg/dL — ABNORMAL HIGH (ref 0.44–1.00)
GFR, Estimated: 37 mL/min — ABNORMAL LOW (ref >60)
Glucose, Bld: 121 mg/dL — ABNORMAL HIGH (ref 70–99)
Potassium: 4 mmol/L (ref 3.5–5.1)
Sodium: 137 mmol/L (ref 135–145)

## 2022-08-10 LAB — RESP PANEL BY RT-PCR (RSV, FLU A&B, COVID)  RVPGX2
Influenza A by PCR: NEGATIVE
Influenza B by PCR: NEGATIVE
Resp Syncytial Virus by PCR: NEGATIVE
SARS Coronavirus 2 by RT PCR: NEGATIVE

## 2022-08-10 LAB — ECHOCARDIOGRAM LIMITED
Height: 62 in
Weight: 2432 oz

## 2022-08-10 LAB — BRAIN NATRIURETIC PEPTIDE: B Natriuretic Peptide: 74.4 pg/mL (ref 0.0–100.0)

## 2022-08-10 LAB — TROPONIN I (HIGH SENSITIVITY)
Troponin I (High Sensitivity): 223 ng/L (ref <18)
Troponin I (High Sensitivity): 168 ng/L (ref <18)

## 2022-08-10 SURGERY — LEFT HEART CATH AND CORONARY ANGIOGRAPHY
Anesthesia: LOCAL

## 2022-08-10 MED ORDER — HYDRALAZINE HCL 20 MG/ML IJ SOLN: 10.0000 mg | INTRAMUSCULAR | Status: AC | PRN

## 2022-08-10 MED ORDER — NITROGLYCERIN 1 MG/10 ML FOR IR/CATH LAB
INTRA_ARTERIAL | Status: AC
  Filled 2022-08-10: qty 10

## 2022-08-10 MED ORDER — ACETAMINOPHEN 500 MG PO TABS
1000.0000 mg | ORAL_TABLET | Freq: Once | ORAL | Status: AC
  Administered 2022-08-10: 1000 mg via ORAL
  Filled 2022-08-10: qty 2

## 2022-08-10 MED ORDER — IOHEXOL 350 MG/ML SOLN
50.0000 mL | Freq: Once | INTRAVENOUS | Status: AC | PRN
  Administered 2022-08-10: 50 mL via INTRAVENOUS

## 2022-08-10 MED ORDER — TICAGRELOR 90 MG PO TABS
ORAL_TABLET | ORAL | Status: DC | PRN
  Administered 2022-08-10: 180 mg via ORAL

## 2022-08-10 MED ORDER — ACETAMINOPHEN 325 MG PO TABS
650.0000 mg | ORAL_TABLET | ORAL | Status: DC | PRN
  Administered 2022-08-11 (×2): 650 mg via ORAL
  Filled 2022-08-10 (×2): qty 2

## 2022-08-10 MED ORDER — ONDANSETRON HCL 4 MG/2ML IJ SOLN
INTRAMUSCULAR | Status: AC
  Filled 2022-08-10: qty 2

## 2022-08-10 MED ORDER — SODIUM CHLORIDE 0.9% FLUSH: 3.0000 mL | INTRAVENOUS | Status: DC | PRN

## 2022-08-10 MED ORDER — TICAGRELOR 90 MG PO TABS
90.0000 mg | ORAL_TABLET | Freq: Two times a day (BID) | ORAL | Status: DC
  Administered 2022-08-10 – 2022-08-13 (×7): 90 mg via ORAL
  Filled 2022-08-10 (×8): qty 1

## 2022-08-10 MED ORDER — NITROGLYCERIN 0.4 MG SL SUBL
0.4000 mg | SUBLINGUAL_TABLET | SUBLINGUAL | Status: AC | PRN
  Administered 2022-08-10 – 2022-08-11 (×3): 0.4 mg via SUBLINGUAL
  Filled 2022-08-10 (×3): qty 1

## 2022-08-10 MED ORDER — COLCHICINE 0.6 MG PO TABS
0.6000 mg | ORAL_TABLET | Freq: Two times a day (BID) | ORAL | Status: DC
  Administered 2022-08-10 – 2022-08-14 (×8): 0.6 mg via ORAL
  Filled 2022-08-10 (×8): qty 1

## 2022-08-10 MED ORDER — MIDAZOLAM HCL 2 MG/2ML IJ SOLN
INTRAMUSCULAR | Status: AC
  Filled 2022-08-10: qty 2

## 2022-08-10 MED ORDER — ONDANSETRON HCL 4 MG/2ML IJ SOLN
INTRAMUSCULAR | Status: DC | PRN
  Administered 2022-08-10: 4 mg via INTRAVENOUS

## 2022-08-10 MED ORDER — LABETALOL HCL 5 MG/ML IV SOLN
10.0000 mg | INTRAVENOUS | Status: AC | PRN
  Filled 2022-08-10: qty 4

## 2022-08-10 MED ORDER — PANTOPRAZOLE SODIUM 40 MG PO TBEC
40.0000 mg | DELAYED_RELEASE_TABLET | Freq: Every day | ORAL | Status: DC
  Administered 2022-08-10 – 2022-08-14 (×5): 40 mg via ORAL
  Filled 2022-08-10 (×5): qty 1

## 2022-08-10 MED ORDER — INSULIN ASPART 100 UNIT/ML IJ SOLN
0.0000 [IU] | Freq: Three times a day (TID) | INTRAMUSCULAR | Status: DC
  Administered 2022-08-10: 3 [IU] via SUBCUTANEOUS
  Administered 2022-08-11: 2 [IU] via SUBCUTANEOUS
  Administered 2022-08-12: 3 [IU] via SUBCUTANEOUS
  Administered 2022-08-12: 2 [IU] via SUBCUTANEOUS
  Administered 2022-08-12: 3 [IU] via SUBCUTANEOUS
  Administered 2022-08-13 (×2): 2 [IU] via SUBCUTANEOUS
  Administered 2022-08-14: 3 [IU] via SUBCUTANEOUS

## 2022-08-10 MED ORDER — FENTANYL CITRATE (PF) 100 MCG/2ML IJ SOLN
INTRAMUSCULAR | Status: AC
  Filled 2022-08-10: qty 2

## 2022-08-10 MED ORDER — VERAPAMIL HCL 2.5 MG/ML IV SOLN
INTRAVENOUS | Status: AC
  Filled 2022-08-10: qty 2

## 2022-08-10 MED ORDER — SODIUM CHLORIDE 0.9 % IV SOLN: INTRAVENOUS | Status: AC

## 2022-08-10 MED ORDER — SODIUM CHLORIDE 0.9% FLUSH
3.0000 mL | Freq: Two times a day (BID) | INTRAVENOUS | Status: DC
  Administered 2022-08-10 – 2022-08-13 (×8): 3 mL via INTRAVENOUS

## 2022-08-10 MED ORDER — TICAGRELOR 90 MG PO TABS
ORAL_TABLET | ORAL | Status: AC
  Filled 2022-08-10: qty 3

## 2022-08-10 MED ORDER — SODIUM CHLORIDE 0.9 % IV SOLN: 250.0000 mL | INTRAVENOUS | Status: DC | PRN

## 2022-08-10 MED ORDER — ALBUTEROL SULFATE (2.5 MG/3ML) 0.083% IN NEBU
10.0000 mg/h | INHALATION_SOLUTION | Freq: Once | RESPIRATORY_TRACT | Status: AC
  Administered 2022-08-10: 10 mg/h via RESPIRATORY_TRACT
  Filled 2022-08-10: qty 12

## 2022-08-10 MED ORDER — HEPARIN SODIUM (PORCINE) 1000 UNIT/ML IJ SOLN
INTRAMUSCULAR | Status: AC
  Filled 2022-08-10: qty 10

## 2022-08-10 MED ORDER — ORAL CARE MOUTH RINSE: 15.0000 mL | OROMUCOSAL | Status: DC | PRN

## 2022-08-10 MED ORDER — ASPIRIN 325 MG PO TABS
ORAL_TABLET | ORAL | Status: AC
  Filled 2022-08-10: qty 1

## 2022-08-10 MED ORDER — HEPARIN BOLUS VIA INFUSION
3500.0000 [IU] | Freq: Once | INTRAVENOUS | Status: AC
  Administered 2022-08-10: 3500 [IU] via INTRAVENOUS
  Filled 2022-08-10: qty 3500

## 2022-08-10 MED ORDER — SODIUM CHLORIDE 0.9 % IV SOLN
INTRAVENOUS | Status: AC | PRN
  Administered 2022-08-10: 10 mL/h via INTRAVENOUS

## 2022-08-10 MED ORDER — ASPIRIN 325 MG PO TABS
ORAL_TABLET | ORAL | Status: DC | PRN
  Administered 2022-08-10: 325 mg via ORAL

## 2022-08-10 MED ORDER — HEPARIN (PORCINE) 25000 UT/250ML-% IV SOLN: 12.0000 [IU]/kg/h | INTRAVENOUS | Status: DC

## 2022-08-10 MED ORDER — HEPARIN (PORCINE) IN NACL 1000-0.9 UT/500ML-% IV SOLN
INTRAVENOUS | Status: DC | PRN
  Administered 2022-08-10 (×2): 500 mL

## 2022-08-10 MED ORDER — MIDAZOLAM HCL 2 MG/2ML IJ SOLN
INTRAMUSCULAR | Status: DC | PRN
  Administered 2022-08-10 (×3): 1 mg via INTRAVENOUS

## 2022-08-10 MED ORDER — LIDOCAINE HCL (PF) 1 % IJ SOLN
INTRAMUSCULAR | Status: AC
  Filled 2022-08-10: qty 30

## 2022-08-10 MED ORDER — MORPHINE SULFATE (PF) 2 MG/ML IV SOLN
2.0000 mg | Freq: Once | INTRAVENOUS | Status: AC
  Administered 2022-08-10: 2 mg via INTRAVENOUS
  Filled 2022-08-10: qty 1

## 2022-08-10 MED ORDER — HEPARIN (PORCINE) 25000 UT/250ML-% IV SOLN
750.0000 [IU]/h | INTRAVENOUS | Status: DC
  Administered 2022-08-10: 750 [IU]/h via INTRAVENOUS
  Filled 2022-08-10: qty 250

## 2022-08-10 MED ORDER — HEPARIN SODIUM (PORCINE) 1000 UNIT/ML IJ SOLN
INTRAMUSCULAR | Status: DC | PRN
  Administered 2022-08-10: 5000 [IU] via INTRAVENOUS
  Administered 2022-08-10: 2000 [IU] via INTRAVENOUS

## 2022-08-10 MED ORDER — IOHEXOL 350 MG/ML SOLN
INTRAVENOUS | Status: DC | PRN
  Administered 2022-08-10: 100 mL

## 2022-08-10 MED ORDER — CHLORHEXIDINE GLUCONATE CLOTH 2 % EX PADS
6.0000 | MEDICATED_PAD | Freq: Every day | CUTANEOUS | Status: DC
  Administered 2022-08-10 – 2022-08-14 (×5): 6 via TOPICAL

## 2022-08-10 MED ORDER — VERAPAMIL HCL 2.5 MG/ML IV SOLN
INTRAVENOUS | Status: DC | PRN
  Administered 2022-08-10: 5 mL via INTRA_ARTERIAL

## 2022-08-10 MED ORDER — ASPIRIN 81 MG PO CHEW
81.0000 mg | CHEWABLE_TABLET | Freq: Every day | ORAL | Status: DC
  Administered 2022-08-11 – 2022-08-14 (×4): 81 mg via ORAL
  Filled 2022-08-10 (×4): qty 1

## 2022-08-10 MED ORDER — FENTANYL CITRATE (PF) 100 MCG/2ML IJ SOLN
INTRAMUSCULAR | Status: DC | PRN
  Administered 2022-08-10 (×3): 25 ug via INTRAVENOUS

## 2022-08-10 MED ORDER — ASPIRIN 81 MG PO CHEW
324.0000 mg | CHEWABLE_TABLET | Freq: Once | ORAL | Status: DC
  Filled 2022-08-10: qty 4

## 2022-08-10 MED ORDER — LIDOCAINE HCL (PF) 1 % IJ SOLN
INTRAMUSCULAR | Status: DC | PRN
  Administered 2022-08-10: 2 mL

## 2022-08-10 MED ORDER — ALPRAZOLAM 0.5 MG PO TABS
0.5000 mg | ORAL_TABLET | Freq: Two times a day (BID) | ORAL | Status: DC | PRN
  Administered 2022-08-10 – 2022-08-14 (×8): 0.5 mg via ORAL
  Filled 2022-08-10 (×8): qty 1

## 2022-08-10 MED ORDER — ONDANSETRON HCL 4 MG/2ML IJ SOLN: 4.0000 mg | Freq: Four times a day (QID) | INTRAMUSCULAR | Status: DC | PRN

## 2022-08-10 SURGICAL SUPPLY — 33 items
BALL SAPPHIRE NC24 3.0X15 (BALLOONS) ×1
BALLN EMERGE MR 2.0X12 (BALLOONS) ×1
BALLN EMERGE MR 2.25X12 (BALLOONS) ×1
BALLN EMERGE MR 2.5X12 (BALLOONS) ×1
BALLN EMERGE MR 3.0X12 (BALLOONS) ×1
BALLN ~~LOC~~ EMERGE MR 2.5X12 (BALLOONS) ×1
BALLOON EMERGE MR 2.0X12 (BALLOONS) IMPLANT
BALLOON EMERGE MR 2.25X12 (BALLOONS) IMPLANT
BALLOON EMERGE MR 2.5X12 (BALLOONS) IMPLANT
BALLOON EMERGE MR 3.0X12 (BALLOONS) IMPLANT
BALLOON SAPPHIRE NC24 3.0X15 (BALLOONS) IMPLANT
BALLOON ~~LOC~~ EMERGE MR 2.5X12 (BALLOONS) IMPLANT
CATH GUIDELINER COAST (CATHETERS) IMPLANT
CATH INFINITI 6F FL3.5 (CATHETERS) IMPLANT
CATH LAUNCHER 6FR JR4 (CATHETERS) IMPLANT
CATH SHOCKWAVE C2 3.0X12 (CATHETERS) IMPLANT
DEVICE RAD TR BAND REGULAR (VASCULAR PRODUCTS) IMPLANT
GLIDESHEATH SLEND SS 6F .021 (SHEATH) IMPLANT
GUIDEWIRE INQWIRE 1.5J.035X260 (WIRE) IMPLANT
GUIDEWIRE VAS SION BLUE 190 (WIRE) IMPLANT
INQWIRE 1.5J .035X260CM (WIRE) ×1
KIT ENCORE 26 ADVANTAGE (KITS) IMPLANT
KIT HEART LEFT (KITS) ×1 IMPLANT
KIT HEMO VALVE WATCHDOG (MISCELLANEOUS) IMPLANT
PACK CARDIAC CATHETERIZATION (CUSTOM PROCEDURE TRAY) ×1 IMPLANT
STENT SYNERGY XD 2.25X20 (Permanent Stent) IMPLANT
STENT SYNERGY XD 3.0X28 (Permanent Stent) IMPLANT
SYNERGY XD 2.25X20 (Permanent Stent) ×1 IMPLANT
SYNERGY XD 3.0X28 (Permanent Stent) ×1 IMPLANT
SYR MEDRAD MARK 7 150ML (SYRINGE) ×1 IMPLANT
TRANSDUCER W/STOPCOCK (MISCELLANEOUS) ×1 IMPLANT
TUBING CIL FLEX 10 FLL-RA (TUBING) ×1 IMPLANT
WIRE ASAHI PROWATER 180CM (WIRE) IMPLANT

## 2022-08-10 NOTE — Progress Notes (Signed)
Chaplain responded to code stemi page. Pt room was empty. Chaplain will make attempt to locate family in cath lab waiting.  Please page as further needs arise.  Maryanna Shape. Carley Hammed, M.Div. [facility] Chaplain Pager [phone] Office [phone]

## 2022-08-10 NOTE — ED Notes (Signed)
STEMI pads placed on pt 

## 2022-08-10 NOTE — ED Triage Notes (Signed)
Chest pain that started around 3 this am, shortness of breath. Has had a 5 albuterol, 1 duoneb, 125mg  solumedrol and 2 g magnesium sulfate enroute to the er. Diminished lung sounds

## 2022-08-10 NOTE — Care Management (Signed)
  Transition of Care [facility]) Screening Note   Patient Details  Name: [name] Date of Birth: [birth year]   Transition of Care [facility]) CM/SW Contact:    Gala Lewandowsky, RN Phone Number: 08/10/2022, 4:31 PM    Transition of Care Department [facility]) has reviewed the patient and no TOC needs have been identified at this time. Patient presented for chest pain- post LHC. We will continue to monitor patient advancement through interdisciplinary progression rounds. If new patient transition needs arise, please place a TOC consult.

## 2022-08-10 NOTE — Progress Notes (Signed)
Chaplain follow up visit after Code STEMI page this morning. Chaplain met with pt's two daughters, son, and son in-law waiting outside of CVICU. They had just received report from the physician after their mother's procedure. Chaplain asked open ended questions to facilitate story telling and emotional expression. Her daughter shared how frightening it was to see her mother with medical equipment and that her initial understanding was that she was having an asthma attack, so this felt particularly jarring. Chaplain encouraged the family to practice self care as they mentioned feeling tired and having headaches. Chaplain assisted with provision of chairs so they could all sit together as they continue to wait to see their mom.   Chaplains remain available for continued support. Please page as needs arise.   Maryanna Shape. Carley Hammed, M.Div. [facility] Chaplain Pager [phone] Office [phone]

## 2022-08-10 NOTE — Progress Notes (Signed)
ANTICOAGULATION CONSULT NOTE - Initial Consult  Pharmacy Consult for Heparin Indication: chest pain/ACS  Allergies  Allergen Reactions   Tramadol Other (See Comments)    Not to be taken because of existing seizure disorder/SEIZURES    Chlorthalidone Other (See Comments)    Was stopped due to kidney function    Patient Measurements: Height: 5\' 2"  (157.5 cm) Weight: 68.9 kg (152 lb) IBW/kg (Calculated) : 50.1 Heparin Dosing Weight: 64.5kg  Vital Signs: Temp: 98.2 F (36.8 C) (04/11 0503) Temp Source: Oral (04/11 0503) BP: 168/75 (04/11 0700) Pulse Rate: 101 (04/11 0700)  Labs: Recent Labs    08/10/22 0507 08/10/22 0517  HGB 11.8* 11.2*  HCT 36.5 33.0*  PLT 271  --   CREATININE 1.49*  --   TROPONINIHS 223*  --     Estimated Creatinine Clearance: 30.6 mL/min (A) (by C-G formula based on SCr of 1.49 mg/dL (H)).   Medical History: Past Medical History:  Diagnosis Date   Anxiety    Arthritis    Asthma    Chronic diastolic CHF (congestive heart failure) 07/17/2016   Coronary artery disease    a. NSTEMI 12/2015 - LHC 01/18/16: s/p overlapping DESx2 to RCA, 10% ost-prox Cx,10% mLAD.   Dementia    Depression    Diabetes mellitus    Dyslipidemia    GERD (gastroesophageal reflux disease)    History of seizure    Hypertension    Hypertensive heart disease    NSTEMI (non-ST elevated myocardial infarction) 12/2015   Stage 3 chronic kidney disease     Assessment: 77 YOF presenting with chest pain and SOB found to have elevated troponins. Not on anticoagulation PTA. Pharmacy consulted to dose heparin. Goal of Therapy:  Heparin level 0.3-0.7 units/ml Monitor platelets by anticoagulation protocol: Yes   Plan:  Heparin 3500 units IV once then 750 units/hr Check 8hr heparin level at 1600 Daily CBC and heparin level  Ellis Savage, PharmD Clinical Pharmacist 08/10/2022,7:19 AM

## 2022-08-10 NOTE — ED Provider Notes (Signed)
Care assumed from Dr. Nicanor Alcon while awaiting results of CT PE study prior to cardiology evaluation and ultimately likely admission.  CT scan returned without evidence of pulmonary embolism however after return from the scanner, patient is complaining of worsening chest pain.  She reports it is 10 out of 10 crushing in her central chest.  EKG was obtained and is concerning for acute STEMI.  This is different than it was earlier.  I spoke to cardiology who will activate STEMI and they will admit for further management.  Patient was already on heparin and cardiology quickly came to see patient and manage.     Lisset Ketchem, Canary Brim, MD 08/10/22 [phone]

## 2022-08-10 NOTE — H&P (Addendum)
Cardiology Admission History and Physical   Patient ID: [name] MRN: [id number]; DOB: 22-Mar-1951   Admission date: 08/10/2022  PCP:  [name], MD   [facility] Providers Cardiologist:  Donato Schultz, MD     Chief Complaint:  Chest Pain/inferior STEMI  Patient Profile:   [name] is a 72 y.o. female with past medical history of CAD status post DES x 2 to RCA '17, hypertension, hyperlipidemia, GERD, diabetes who is being seen 08/10/2022 for the evaluation of chest pain/inferior STEMI.  History of Present Illness:   Ms. Welk is a 72 year old female with past medical history noted above.  She presented to the ED 12/2015 with complaints of shortness of breath and chest tightness.  Underwent cardiac catheterization with DES x 2 to RCA, 10% ostial/proximal circumflex and 10% mid LAD.  She was treated with DAPT with aspirin/Plavix.  Echocardiogram 07/2017 showed LVEF of 65 to 70%, grade 2 diastolic dysfunction, trivial MR.  She was seen in the office 12/2021 for preoperative risk evaluation pending knee surgery.  She complained of dyspnea on exertion.  She underwent a nuclear stress test which was negative.   Most recently seen in the office on 4/5 and reported being in her usual state of health.  Stated she has been under a lot of stress recently secondary to her husband's diagnosis of cancer.  She was continued on atorvastatin, diastolic, hydralazine and spironolactone.  In the to the ED on 4/11 with complaints of left-sided chest pain that started around 4 AM.  Also felt dyspneic and slightly diaphoretic with symptoms.  EMS was called.  She was given albuterol, DuoNeb, Solu-Medrol and IV magnesium while in route.   Labs on admission showed sodium 137, potassium 4, creatinine 1.4, BNP 74, high-sensitivity troponin 223>> 68, WBC 11.6, hemoglobin 11.8.  Chest x-ray with mild atelectasis.  EKG at 4:59 AM, sinus rhythm, 96 bpm baseline artifact.  Repeat EKG at 8:12 AM sinus  tachycardia, 112 bpm, acute ST elevation in inferior leads with reciprocal depression in lead I, aVL, V2.  Patient still with ongoing chest pain.  Code STEMI was activated.  At the time of assessment patient complains of significant centralized chest pain 12/10.  She had been placed on IV heparin, initially not given aspirin secondary to reported allergy, though she states this is related to her renal function.  Transferred up to the Cath Lab for emergent cardiac catheterization.   Past Medical History:  Diagnosis Date   Anxiety    Arthritis    Asthma    Chronic diastolic CHF (congestive heart failure) 07/17/2016   Coronary artery disease    a. NSTEMI 12/2015 - LHC 01/18/16: s/p overlapping DESx2 to RCA, 10% ost-prox Cx,10% mLAD.   Dementia    Depression    Diabetes mellitus    Dyslipidemia    GERD (gastroesophageal reflux disease)    History of seizure    Hypertension    Hypertensive heart disease    NSTEMI (non-ST elevated myocardial infarction) 12/2015   Stage 3 chronic kidney disease     Past Surgical History:  Procedure Laterality Date   ABDOMINAL HYSTERECTOMY     CARDIAC CATHETERIZATION  1994   minimal LAD dz, no other dz, EF normal   CARDIAC CATHETERIZATION N/A 01/18/2016   Procedure: Left Heart Cath and Coronary Angiography;  Surgeon: Kathleene Hazel, MD;  Location: [facility] INVASIVE CV LAB;  Service: Cardiovascular;  Laterality: N/A;   CARDIAC CATHETERIZATION N/A 01/18/2016   Procedure:  Coronary Stent Intervention;  Surgeon: Kathleene Hazel, MD;  Location: [facility] INVASIVE CV LAB;  Service: Cardiovascular;  Laterality: N/A;   COLONOSCOPY WITH PROPOFOL N/A 04/09/2014   Procedure: COLONOSCOPY WITH PROPOFOL;  Surgeon: Florencia Reasons, MD;  Location: [facility] ENDOSCOPY;  Service: Endoscopy;  Laterality: N/A;   CORONARY STENT PLACEMENT  01/19/2016   STENT SYNERGY DES [id number] drug eluting stent was successfully placed, and overlaps the 2.5 x 38 mm Synergy stent placed distally.    ESOPHAGOGASTRODUODENOSCOPY (EGD) WITH PROPOFOL N/A 04/09/2014   Procedure: ESOPHAGOGASTRODUODENOSCOPY (EGD) WITH PROPOFOL;  Surgeon: Florencia Reasons, MD;  Location: [facility] ENDOSCOPY;  Service: Endoscopy;  Laterality: N/A;   FOOT SURGERY Bilateral    approx ~2000   HEMORRHOID SURGERY     HERNIA REPAIR     KNEE ARTHROSCOPY     RADIOACTIVE SEED GUIDED EXCISIONAL BREAST BIOPSY Left 01/20/2021   Procedure: RADIOACTIVE SEED GUIDED EXCISIONAL LEFT BREAST BIOPSY;  Surgeon: Almond Lint, MD;  Location: [facility] OR;  Service: General;  Laterality: Left;     Medications Prior to Admission: Prior to Admission medications   Medication Sig Start Date End Date Taking? Authorizing Provider  albuterol (PROAIR HFA) 108 (90 Base) MCG/ACT inhaler Inhale 2 puffs into the lungs every 4 (four) hours as needed for wheezing or shortness of breath. 04/20/21   Parrett, Virgel Bouquet, NP  albuterol (PROVENTIL) (2.5 MG/3ML) 0.083% nebulizer solution Take 3 mLs (2.5 mg total) by nebulization every 4 (four) hours as needed for wheezing or shortness of breath. 05/17/21   Nyoka Cowden, MD  ALPRAZolam Prudy Feeler) 0.5 MG tablet Take 1 tablet (0.5 mg total) by mouth 2 (two) times daily as needed for anxiety. Patient taking differently: Take 0.5 mg by mouth See admin instructions. Take 0.5 mg by mouth at bedtime and an additional 0.5 mg once a day as needed for anxiety 07/31/15   Dorothea Ogle, MD  atorvastatin (LIPITOR) 40 MG tablet Take 40 mg by mouth at bedtime.    [provider]  Biotin 5000 MCG CAPS Take 5,000 mcg by mouth daily.    [provider]  budesonide (PULMICORT) 0.5 MG/2ML nebulizer solution USE 1 VIAL IN NEBULIZER IN THE MORNING AND AT BEDTIME Patient taking differently: Take 0.5 mg by nebulization in the morning and at bedtime. 03/29/21   Nyoka Cowden, MD  diltiazem (CARDIZEM CD) 360 MG 24 hr capsule Take 360 mg by mouth daily.    [provider]  divalproex (DEPAKOTE ER) 500 MG 24 hr tablet Take 1  tablet (500 mg total) by mouth at bedtime. 08/08/22   Glean Salvo, NP  ferrous sulfate 325 (65 FE) MG tablet Take 325 mg by mouth daily with breakfast. 07/15/21   [provider]  formoterol (PERFOROMIST) 20 MCG/2ML nebulizer solution USE 1 VIAL VIA NEBULIZER TWICE  DAILY IN THE MORNING AND EVENING 05/23/22   Nyoka Cowden, MD  hydrALAZINE (APRESOLINE) 100 MG tablet TAKE 1 TABLET BY MOUTH 3 TIMES  DAILY 07/10/22   Jake Bathe, MD  latanoprost (XALATAN) 0.005 % ophthalmic solution Place 1 drop into both eyes at bedtime. 02/05/20   [provider]  magnesium oxide (MAG-OX) 400 (240 Mg) MG tablet Take 1 tablet (400 mg total) by mouth daily. 05/14/21   Rolly Salter, MD  memantine (NAMENDA) 10 MG tablet Take 1 tablet (10 mg total) by mouth 2 (two) times daily. 08/08/22   Glean Salvo, NP  metFORMIN (GLUCOPHAGE) 1000 MG tablet Take 1,000 mg  by mouth 2 (two) times daily with a meal.    [provider]  montelukast (SINGULAIR) 10 MG tablet TAKE 1 TABLET BY MOUTH AT  BEDTIME 06/05/22   Nyoka Cowden, MD  Multiple Vitamins-Minerals (ONE-A-DAY WOMENS PO) Take 1 tablet by mouth daily with breakfast.    [provider]  nebivolol (BYSTOLIC) 10 MG tablet Take 1 tablet (10 mg total) by mouth daily. Patient taking differently: Take 10 mg by mouth 2 (two) times daily. 04/10/19   Jake Bathe, MD  nitroGLYCERIN (NITROSTAT) 0.4 MG SL tablet Place 1 tablet (0.4 mg total) under the tongue every 5 (five) minutes as needed for chest pain. Don't exceed more than 3 doses in 15 mins 06/24/21   Glade Lloyd, MD  pantoprazole (PROTONIX) 40 MG tablet Take 1 tablet (40 mg total) by mouth 2 (two) times daily before a meal. 06/24/21   Glade Lloyd, MD  Probiotic Product (EQ PROBIOTIC) CAPS Take 1 capsule by mouth every morning.    [provider]  spironolactone (ALDACTONE) 25 MG tablet Take 1 tablet (25 mg total) by mouth daily. 01/26/22 01/27/23  Swinyer, Zachary George, NP      Allergies:    Allergies  Allergen Reactions   Tramadol Other (See Comments)    Not to be taken because of existing seizure disorder/SEIZURES    Chlorthalidone Other (See Comments)    Was stopped due to kidney function    Social History:   Social History   Socioeconomic History   Marital status: Married    Spouse name: Not on file   Number of children: 3   Years of education: some college   Highest education level: Not on file  Occupational History   Occupation: Occupational psychologist  Tobacco Use   Smoking status: Former    Packs/day: 1.00    Years: 30.00    Additional pack years: 0.00    Total pack years: 30.00    Types: Cigarettes    Quit date: 01/09/2004    Years since quitting: 18.5   Smokeless tobacco: Never  Vaping Use   Vaping Use: Never used  Substance and Sexual Activity   Alcohol use: No    Alcohol/week: 0.0 standard drinks of alcohol   Drug use: No   Sexual activity: Not on file  Other Topics Concern   Not on file  Social History Narrative   Pt lives with husband in [place].   Right-handed.   2 cups caffeine per day.   Social Determinants of Health   Financial Resource Strain: Not on file  Food Insecurity: Not on file  Transportation Needs: Not on file  Physical Activity: Not on file  Stress: Not on file  Social Connections: Not on file  Intimate Partner Violence: Not on file    Family History:   The patient's family history includes Allergies in her mother; Asthma in her mother; CAD (age of onset: 15) in her father; CAD (age of onset: 10) in her sister; Congestive Heart Failure in her sister; Diabetes in her father; Hypertension in her father.    ROS:  Please see the history of present illness.  All other ROS reviewed and negative.     Physical Exam/Data:   Vitals:   08/10/22 0742 08/10/22 0745 08/10/22 0800 08/10/22 0815  BP:  (!) 153/68 (!) 148/68 (!) 154/66  Pulse: (!) 102 (!) 103 (!) 119 (!) 122  Resp:  17 (!) 34 18  Temp:       TempSrc:  SpO2: 100% 100% 100% 99%  Weight:      Height:       No intake or output data in the 24 hours ending 08/10/22 0912    08/10/2022    4:55 AM 08/08/2022    3:49 PM 08/04/2022   10:54 AM  Last 3 Weights  Weight (lbs) 152 lb 150 lb 146 lb  Weight (kg) 68.947 kg 68.04 kg 66.225 kg     Body mass index is 27.8 kg/m.  General:  Well nourished, well developed, in no acute distress HEENT: normal Neck: no JVD Vascular: No carotid bruits; Distal pulses 2+ bilaterally   Cardiac:  normal S1, S2; RRR; no murmur  Lungs:  clear to auscultation bilaterally, no wheezing, rhonchi or rales  Abd: soft, nontender, no hepatomegaly  Ext: no edema Musculoskeletal:  No deformities, BUE and BLE strength normal and equal Skin: warm and dry  Neuro:  CNs 2-12 intact, no focal abnormalities noted Psych:  Normal affect    EKG:    EKG at 4:59 AM, sinus rhythm, 96 bpm baseline artifact.   Repeat EKG at 8:12 AM sinus tachycardia, 112 bpm, acute ST elevation in inferior leads with reciprocal depression in lead I, aVL, V2.    Relevant CV Studies:  Cath: 12/2015  Mid RCA lesion, 70 %stenosed. Dist RCA lesion, 90 %stenosed. A STENT SYNERGY DES [id number] drug eluting stent was successfully placed, and overlaps the 2.5 x 38 mm Synergy stent placed distally. Prox RCA lesion, 80 %stenosed. Post intervention, there is a 0% residual stenosis. Ost Cx to Prox Cx lesion, 10 %stenosed. Mid LAD lesion, 10 %stenosed.   1. NSTEMI 2. Severe single vessel CAD with severe stenosis in the mid and distal RCA 3. Successful PTCA/DES x 2 mid and distal RCA   Recommendations: Dual anti-platelet therapy with ASA and Brilinta for one year. Continue statin.   Diagnostic Dominance: Right  Intervention    Echo: 07/2017  Study Conclusions   - Left ventricle: The cavity size was normal. There was moderate    concentric hypertrophy. Systolic function was vigorous. The    estimated ejection fraction was in the  range of 65% to 70%. Wall    motion was normal; there were no regional wall motion    abnormalities. Features are consistent with a pseudonormal left    ventricular filling pattern, with concomitant abnormal relaxation    and increased filling pressure (grade 2 diastolic dysfunction).    Doppler parameters are consistent with high ventricular filling    pressure.  - Mitral valve: There was trivial regurgitation.  - Pulmonary arteries: PA peak pressure: 34 mm Hg (S).   Laboratory Data:  High Sensitivity Troponin:   Recent Labs  Lab 08/10/22 0507 08/10/22 0716  TROPONINIHS 223* 168*      Chemistry Recent Labs  Lab 08/10/22 0507 08/10/22 0517  NA 137 139  K 4.0 3.8  CL 104  --   CO2 22  --   GLUCOSE 121*  --   BUN 23  --   CREATININE 1.49*  --   CALCIUM 9.5  --   GFRNONAA 37*  --   ANIONGAP 11  --     No results for input(s): "PROT", "ALBUMIN", "AST", "ALT", "ALKPHOS", "BILITOT" in the last 168 hours. Lipids No results for input(s): "CHOL", "TRIG", "HDL", "LABVLDL", "LDLCALC", "CHOLHDL" in the last 168 hours. Hematology Recent Labs  Lab 08/10/22 0507 08/10/22 0517  WBC 11.6*  --   RBC 3.61*  --  HGB 11.8* 11.2*  HCT 36.5 33.0*  MCV 101.1*  --   MCH 32.7  --   MCHC 32.3  --   RDW 12.6  --   PLT 271  --    Thyroid No results for input(s): "TSH", "FREET4" in the last 168 hours. BNP Recent Labs  Lab 08/10/22 0507  BNP 74.4    DDimer No results for input(s): "DDIMER" in the last 168 hours.   Radiology/Studies:  CT Angio Chest PE W and/or Wo Contrast  Result Date: 08/10/2022 CLINICAL DATA:  Dyspnea, chronic, chest wall or pleura disease suspected. EXAM: CT ANGIOGRAPHY CHEST WITH CONTRAST TECHNIQUE: Multidetector CT imaging of the chest was performed using the standard protocol during bolus administration of intravenous contrast. Multiplanar CT image reconstructions and MIPs were obtained to evaluate the vascular anatomy. RADIATION DOSE REDUCTION: This exam was  performed according to the departmental dose-optimization program which includes automated exposure control, adjustment of the mA and/or kV according to patient size and/or use of iterative reconstruction technique. CONTRAST:  50mL OMNIPAQUE IOHEXOL 350 MG/ML SOLN COMPARISON:  Chest radiograph 08/10/2022 and chest CTA 03/31/2022 and MR abdomen 04/20/2022 FINDINGS: Cardiovascular: Satisfactory opacification of the pulmonary arteries to the segmental level. No evidence of pulmonary embolism. Normal heart size. No pericardial effusion. Normal caliber of the thoracic aorta. Diffuse coronary artery calcifications. Great vessels are patent. Celiac trunk and proximal SMA are patent in the upper abdomen. Mediastinum/Nodes: No mediastinal, hilar or axillary lymphadenopathy. Esophagus is unremarkable. Thyroid tissue is unremarkable. Lungs/Pleura: Trachea and mainstem bronchi are patent. Patchy densities in lower lobes, right side greater than left. Favor atelectasis in this area. No large areas of consolidation. No pleural effusions. Subpleural nodule in the anterior right upper lobe measures 3 mm on image 55/6 and this is stable since 08/03/2016 and compatible with a benign nodule. Upper Abdomen: Again noted is an exophytic low-density structure along the anterior left kidney upper pole that was previously described as a cyst on the abdominal MRI. No acute abnormality. Musculoskeletal: No acute bone abnormality. Review of the MIP images confirms the above findings. IMPRESSION: 1. Negative for a pulmonary embolism. 2. Patchy densities in the lower lobes, right side greater than left. Findings are most compatible with atelectasis. No large areas of consolidation or pleural effusions. 3. Coronary artery calcifications. 4. Stable 3 mm subpleural nodule in the right upper lobe. This nodule is stable since 08/03/2016 and compatible with a benign nodule. Electronically Signed   By: Richarda Overlie M.D.   On: 08/10/2022 07:14   DG  Chest Portable 1 View  Result Date: 08/10/2022 CLINICAL DATA:  Shortness of breath EXAM: PORTABLE CHEST 1 VIEW COMPARISON:  03/31/2022 FINDINGS: Artifact from EKG leads. Normal heart size and mediastinal contours. Minimal streaky density at the left diaphragm. No acute infiltrate or edema. No effusion or pneumothorax. No acute osseous findings. IMPRESSION: Minimal atelectasis on the left.  Otherwise stable from 2023. Electronically Signed   By: Tiburcio Pea M.D.   On: 08/10/2022 05:36     Assessment and Plan:   DEKISHA MESMER is a 72 y.o. female with past medical history of CAD status post DES x 2 to RCA '17, hypertension, hyperlipidemia, GERD, diabetes who is being seen 08/10/2022 for the evaluation of chest pain/inferior STEMI.  STEMI CAD status post DES x 2 to RCA '17 -- Resented with chest pain that started around 4 AM.  EKG on arrival to the ED was nondiagnostic.  Patient had worsening chest pain and repeat EKG  done at 8:12 AM showed acute ST elevation in inferior leads.  She had received IV heparin.  Code STEMI was activated.  She was brought emergently to the cardiac Cath Lab for urgent cardiac catheterization.  Further recommendations pending cath.  Shared Decision Making/Informed Consent The risks [stroke (1 in 1000), death (1 in 1000), kidney failure [usually temporary] (1 in 500), bleeding (1 in 200), allergic reaction [possibly serious] (1 in 200)], benefits (diagnostic support and management of coronary artery disease) and alternatives of a cardiac catheterization were discussed in detail with Ms. Lowy and she is willing to proceed.  Hypertension -- Blood pressures elevated on the ED though has not received morning medications -- Home meds include Bystolic 10 mg twice daily, spironolactone 25 mg daily, hydralazine 100 mg 3 times daily  Hyperlipidemia -- On atorvastatin 40 mg daily PTA -- Check lipids  Diabetes -- SSI while inpatient  CKD stage IIIa -- Follows with  nephrology as an outpatient, creatinine 1.49 on admission -- Followed BMET, hold Spiro  Risk Assessment/Risk Scores:   TIMI Risk Score for ST  Elevation MI:   The patient's TIMI risk score is 3, which indicates a 4.4% risk of all cause mortality at 30 days.  Severity of Illness: The appropriate patient status for this patient is INPATIENT. Inpatient status is judged to be reasonable and necessary in order to provide the required intensity of service to ensure the patient's safety. The patient's presenting symptoms, physical exam findings, and initial radiographic and laboratory data in the context of their chronic comorbidities is felt to place them at high risk for further clinical deterioration. Furthermore, it is not anticipated that the patient will be medically stable for discharge from the hospital within 2 midnights of admission.   * I certify that at the point of admission it is my clinical judgment that the patient will require inpatient hospital care spanning beyond 2 midnights from the point of admission due to high intensity of service, high risk for further deterioration and high frequency of surveillance required.*   For questions or updates, please contact [facility] Please consult [email or web address] for contact info under     Signed, Laverda Page, NP  08/10/2022 9:12 AM     ATTENDING ATTESTATION:  After conducting a review of all available clinical information with the care team, interviewing the patient, and performing a physical exam, I agree with the findings and plan described in this note.   GEN: Moderate distress.   HEENT:  MMM, no JVD, no scleral icterus Cardiac: RRR, no murmurs, rubs, or gallops. Chest wall TTP Respiratory: Clear to auscultation bilaterally. GI: Soft, nontender, non-distended  MS: No edema; No deformity. Neuro:  Nonfocal  Vasc:  +2 radial pulses  71 AAF with a history of CAD s/p DES x 2 RCA, HTN, HL, T2DM, CKD III here with acute  onset chest pain of a few hours duration.  EKG demonstrates inferior STEs.  She is off chronic aspirin monotherapy due to CKD per her understanding.  I suspect late stent thrombosis of her RCA stents.  Will refer for emergency coronary angiography.  Further recommendations will be informed by the results of this study.  Alverda Skeans, MD Pager [phone]

## 2022-08-10 NOTE — ED Provider Notes (Addendum)
[place] [facility] [place] [facility]   CSN: [id number] Arrival date & time: 08/10/22  [id number]     History  Chief Complaint  Patient presents with   Chest Pain    [name] is a 72 y.o. female.  The history is provided by the EMS personnel.  Chest Pain Pain location:  Substernal area Pain radiates to:  Does not radiate Pain severity:  Severe Onset quality:  Sudden Timing:  Constant Progression:  Unchanged Chronicity:  New Context: at rest   Relieved by:  Nothing Worsened by:  Nothing Ineffective treatments:  None tried Associated symptoms: shortness of breath   Associated symptoms: no fever and no vomiting   Associated symptoms comment:  Wheezing  Risk factors: diabetes mellitus and hypertension   Risk factors: not female and not pregnant   Patient with asthma and NSTEMI with CP and sudden onset wheezing and SOB given albuterol solumedrol and magnesium en route.      Past Medical History:  Diagnosis Date   Anxiety    Arthritis    Asthma    Chronic diastolic CHF (congestive heart failure) 07/17/2016   Coronary artery disease    a. NSTEMI 12/2015 - LHC 01/18/16: s/p overlapping DESx2 to RCA, 10% ost-prox Cx,10% mLAD.   Dementia    Depression    Diabetes mellitus    Dyslipidemia    GERD (gastroesophageal reflux disease)    History of seizure    Hypertension    Hypertensive heart disease    NSTEMI (non-ST elevated myocardial infarction) 12/2015   Stage 3 chronic kidney disease      Home Medications Prior to Admission medications   Medication Sig Start Date End Date Taking? Authorizing Provider  albuterol (PROAIR HFA) 108 (90 Base) MCG/ACT inhaler Inhale 2 puffs into the lungs every 4 (four) hours as needed for wheezing or shortness of breath. 04/20/21   Parrett, Virgel Bouquetammy S, NP  albuterol (PROVENTIL) (2.5 MG/3ML) 0.083% nebulizer solution Take 3 mLs (2.5 mg total) by nebulization every 4 (four) hours as needed for wheezing or  shortness of breath. 05/17/21   Nyoka CowdenWert, Michael B, MD  ALPRAZolam Prudy Feeler(XANAX) 0.5 MG tablet Take 1 tablet (0.5 mg total) by mouth 2 (two) times daily as needed for anxiety. Patient taking differently: Take 0.5 mg by mouth See admin instructions. Take 0.5 mg by mouth at bedtime and an additional 0.5 mg once a day as needed for anxiety 07/31/15   Dorothea OgleMyers, Iskra M, MD  atorvastatin (LIPITOR) 40 MG tablet Take 40 mg by mouth at bedtime.    [provider]  Biotin 5000 MCG CAPS Take 5,000 mcg by mouth daily.    [provider]  budesonide (PULMICORT) 0.5 MG/2ML nebulizer solution USE 1 VIAL IN NEBULIZER IN THE MORNING AND AT BEDTIME Patient taking differently: Take 0.5 mg by nebulization in the morning and at bedtime. 03/29/21   Nyoka CowdenWert, Michael B, MD  diltiazem (CARDIZEM CD) 360 MG 24 hr capsule Take 360 mg by mouth daily.    [provider]  divalproex (DEPAKOTE ER) 500 MG 24 hr tablet Take 1 tablet (500 mg total) by mouth at bedtime. 08/08/22   Glean SalvoSlack, Sarah J, NP  ferrous sulfate 325 (65 FE) MG tablet Take 325 mg by mouth daily with breakfast. 07/15/21   [provider]  formoterol (PERFOROMIST) 20 MCG/2ML nebulizer solution USE 1 VIAL VIA NEBULIZER TWICE  DAILY IN THE MORNING AND EVENING 05/23/22   Nyoka CowdenWert, Michael B, MD  hydrALAZINE (  APRESOLINE) 100 MG tablet TAKE 1 TABLET BY MOUTH 3 TIMES  DAILY 07/10/22   Jake Bathe, MD  latanoprost (XALATAN) 0.005 % ophthalmic solution Place 1 drop into both eyes at bedtime. 02/05/20   [provider]  magnesium oxide (MAG-OX) 400 (240 Mg) MG tablet Take 1 tablet (400 mg total) by mouth daily. 05/14/21   Rolly Salter, MD  memantine (NAMENDA) 10 MG tablet Take 1 tablet (10 mg total) by mouth 2 (two) times daily. 08/08/22   Glean Salvo, NP  metFORMIN (GLUCOPHAGE) 1000 MG tablet Take 1,000 mg by mouth 2 (two) times daily with a meal.    [provider]  montelukast (SINGULAIR) 10 MG tablet TAKE 1 TABLET BY MOUTH AT  BEDTIME  06/05/22   Nyoka Cowden, MD  Multiple Vitamins-Minerals (ONE-A-DAY WOMENS PO) Take 1 tablet by mouth daily with breakfast.    [provider]  nebivolol (BYSTOLIC) 10 MG tablet Take 1 tablet (10 mg total) by mouth daily. Patient taking differently: Take 10 mg by mouth 2 (two) times daily. 04/10/19   Jake Bathe, MD  nitroGLYCERIN (NITROSTAT) 0.4 MG SL tablet Place 1 tablet (0.4 mg total) under the tongue every 5 (five) minutes as needed for chest pain. Don't exceed more than 3 doses in 15 mins 06/24/21   Glade Lloyd, MD  pantoprazole (PROTONIX) 40 MG tablet Take 1 tablet (40 mg total) by mouth 2 (two) times daily before a meal. 06/24/21   Glade Lloyd, MD  Probiotic Product (EQ PROBIOTIC) CAPS Take 1 capsule by mouth every morning.    [provider]  spironolactone (ALDACTONE) 25 MG tablet Take 1 tablet (25 mg total) by mouth daily. 01/26/22 01/27/23  Swinyer, Zachary George, NP      Allergies    Tramadol and Chlorthalidone    Review of Systems   Review of Systems  Unable to perform ROS: Acuity of condition  Constitutional:  Negative for fever.  Respiratory:  Positive for shortness of breath.   Cardiovascular:  Positive for chest pain.  Gastrointestinal:  Negative for vomiting.    Physical Exam Updated Vital Signs BP (!) 159/72   Pulse 95   Temp 98.2 F (36.8 C) (Oral)   Resp 15   Ht 5\' 2"  (1.575 m)   Wt 68.9 kg   LMP  (LMP Unknown)   SpO2 100%   BMI 27.80 kg/m  Physical Exam Vitals and nursing note reviewed. Exam conducted with a chaperone present.  Constitutional:      General: She is in acute distress.     Appearance: She is well-developed.  HENT:     Head: Normocephalic and atraumatic.     Nose: Nose normal.  Eyes:     Pupils: Pupils are equal, round, and reactive to light.  Cardiovascular:     Rate and Rhythm: Regular rhythm. Tachycardia present.     Pulses: Normal pulses.     Heart sounds: Normal heart sounds.  Pulmonary:     Effort:  Tachypnea and respiratory distress present.     Breath sounds: Decreased air movement present.  Abdominal:     General: Bowel sounds are normal. There is no distension.     Palpations: Abdomen is soft.     Tenderness: There is no abdominal tenderness. There is no guarding or rebound.  Genitourinary:    Vagina: No vaginal discharge.  Musculoskeletal:        General: Normal range of motion.     Cervical back: Neck  supple.     Right lower leg: No edema.     Left lower leg: No edema.  Skin:    General: Skin is warm and dry.     Capillary Refill: Capillary refill takes less than 2 seconds.     Findings: No erythema or rash.  Neurological:     General: No focal deficit present.     Mental Status: She is alert.     Deep Tendon Reflexes: Reflexes normal.  Psychiatric:        Mood and Affect: Mood normal.     ED Results / Procedures / Treatments   Labs (all labs ordered are listed, but only abnormal results are displayed) Results for orders placed or performed during the hospital encounter of 08/10/22  CBC with Differential  Result Value Ref Range   WBC 11.6 (H) 4.0 - 10.5 K/uL   RBC 3.61 (L) 3.87 - 5.11 MIL/uL   Hemoglobin 11.8 (L) 12.0 - 15.0 g/dL   HCT 33.5 82.5 - 18.9 %   MCV 101.1 (H) 80.0 - 100.0 fL   MCH 32.7 26.0 - 34.0 pg   MCHC 32.3 30.0 - 36.0 g/dL   RDW 84.2 10.3 - 12.8 %   Platelets 271 150 - 400 K/uL   nRBC 0.0 0.0 - 0.2 %   Neutrophils Relative % 39 %   Neutro Abs 4.5 1.7 - 7.7 K/uL   Lymphocytes Relative 51 %   Lymphs Abs 5.9 (H) 0.7 - 4.0 K/uL   Monocytes Relative 7 %   Monocytes Absolute 0.8 0.1 - 1.0 K/uL   Eosinophils Relative 0 %   Eosinophils Absolute 0.0 0.0 - 0.5 K/uL   Basophils Relative 3 %   Basophils Absolute 0.3 (H) 0.0 - 0.1 K/uL   nRBC 0 0 /100 WBC   Abs Immature Granulocytes 0.00 0.00 - 0.07 K/uL  Basic metabolic panel  Result Value Ref Range   Sodium 137 135 - 145 mmol/L   Potassium 4.0 3.5 - 5.1 mmol/L   Chloride 104 98 - 111 mmol/L    CO2 22 22 - 32 mmol/L   Glucose, Bld 121 (H) 70 - 99 mg/dL   BUN 23 8 - 23 mg/dL   Creatinine, Ser 1.18 (H) 0.44 - 1.00 mg/dL   Calcium 9.5 8.9 - 86.7 mg/dL   GFR, Estimated 37 (L) >60 mL/min   Anion gap 11 5 - 15  I-Stat arterial blood gas, ED ([facility], [facility], [facility])  Result Value Ref Range   pH, Arterial 7.376 7.35 - 7.45   pCO2 arterial 35.7 32 - 48 mmHg   pO2, Arterial 176 (H) 83 - 108 mmHg   Bicarbonate 21.0 20.0 - 28.0 mmol/L   TCO2 22 22 - 32 mmol/L   O2 Saturation 100 %   Acid-base deficit 4.0 (H) 0.0 - 2.0 mmol/L   Sodium 139 135 - 145 mmol/L   Potassium 3.8 3.5 - 5.1 mmol/L   Calcium, Ion 1.29 1.15 - 1.40 mmol/L   HCT 33.0 (L) 36.0 - 46.0 %   Hemoglobin 11.2 (L) 12.0 - 15.0 g/dL   Patient temperature 73.7 F    Collection site RADIAL, ALLEN'S TEST ACCEPTABLE    Drawn by RT    Sample type ARTERIAL   Troponin I (High Sensitivity)  Result Value Ref Range   Troponin I (High Sensitivity) 223 (HH) <18 ng/L   DG Chest Portable 1 View  Result Date: 08/10/2022 CLINICAL DATA:  Shortness of breath EXAM: PORTABLE CHEST 1 VIEW  COMPARISON:  03/31/2022 FINDINGS: Artifact from EKG leads. Normal heart size and mediastinal contours. Minimal streaky density at the left diaphragm. No acute infiltrate or edema. No effusion or pneumothorax. No acute osseous findings. IMPRESSION: Minimal atelectasis on the left.  Otherwise stable from 2023. Electronically Signed   By: Tiburcio Pea M.D.   On: 08/10/2022 05:36    EKG EKG Interpretation  Date/Time:  Thursday Niyla Marone 11 2024 04:59:44 EDT Ventricular Rate:  96 PR Interval:  176 QRS Duration: 100 QT Interval:  364 QTC Calculation: 463 R Axis:   6 Text Interpretation: Sinus rhythm Probable left ventricular hypertrophy Confirmed by Esthela Brandner ([id number]) on 08/10/2022 5:01:53 AM  Radiology DG Chest Portable 1 View  Result Date: 08/10/2022 CLINICAL DATA:  Shortness of breath EXAM: PORTABLE CHEST 1 VIEW COMPARISON:  03/31/2022 FINDINGS: Artifact  from EKG leads. Normal heart size and mediastinal contours. Minimal streaky density at the left diaphragm. No acute infiltrate or edema. No effusion or pneumothorax. No acute osseous findings. IMPRESSION: Minimal atelectasis on the left.  Otherwise stable from 2023. Electronically Signed   By: Tiburcio Pea M.D.   On: 08/10/2022 05:36    Procedures Procedures    Medications Ordered in ED Medications  albuterol (PROVENTIL) (2.5 MG/3ML) 0.083% nebulizer solution (has no administration in time range)  aspirin chewable tablet 324 mg (has no administration in time range)  heparin ADULT infusion 100 units/mL (25000 units/223mL) (has no administration in time range)  acetaminophen (TYLENOL) tablet 1,000 mg (1,000 mg Oral Given 08/10/22 2683)     ED Course/ Medical Decision Making/ A&P                             Medical Decision Making Sudden onset CP and SOB   Amount and/or Complexity of Data Reviewed Independent Historian: EMS    Details: See above  External Data Reviewed: notes.    Details: Previous notes  Labs: ordered.    Details: All labs reviewed: elevated troponin 223, white count slight elevations 11.6, hemoglobin slight low 11.8, normal platelet count. Normal sodium 136, normal potassium 4, elevated creatinine 1.49 ABG without hypercapnia  Radiology: ordered and independent interpretation performed.    Details: No PNA of CHF by me on CXR ECG/medicine tests: ordered and independent interpretation performed. Decision-making details documented in ED Course. Discussion of management or test interpretation with external provider(s): Case d/w [facility] cardiology PA who will see the patient   Risk OTC drugs. Prescription drug management.  Critical Care Total time providing critical care: 60 minutes (Bipap and reassessment and potential for serious outcomes )    Final Clinical Impression(s) / ED Diagnoses Final diagnoses:  None   Will need admission signed out to Dr.  Isaac Bliss, Isidra Mings, MD 08/10/22 [phone]

## 2022-08-10 NOTE — Progress Notes (Addendum)
   Called to bedside regarding patient's c/o increased chest pain. She is s/p STEMI with DES x2 to RCA. Also with small distal wire perforation in PDA branch. Initial echo in the cath lab without pericardial effusion. She states, overall she has not had much relief of her chest pain even while in the cath lab post stent. Now seems more like a pleuritic chest pain, but still up under the left breast into the center of her chest. VS- HR: 90, BP: 120s systolic, sats 99%. Overall, stable exam.   Initially planned repeat echo this afternoon, will call to be completed now to ensure no new pericardial effusion. Pending echo results, will adjust meds.   Janice Coffin, NP-C 08/10/2022, 2:24 PM Pager: [phone]  Addendum:   Follow up echo showed small circumferential pericardiac effusion. Ordered for IV morphine x1.

## 2022-08-11 ENCOUNTER — Other Ambulatory Visit (HOSPITAL_COMMUNITY): Payer: Self-pay

## 2022-08-11 ENCOUNTER — Inpatient Hospital Stay (HOSPITAL_COMMUNITY): Payer: Medicare Other

## 2022-08-11 DIAGNOSIS — I2119 ST elevation (STEMI) myocardial infarction involving other coronary artery of inferior wall: Secondary | ICD-10-CM

## 2022-08-11 DIAGNOSIS — I3139 Other pericardial effusion (noninflammatory): Secondary | ICD-10-CM | POA: Diagnosis not present

## 2022-08-11 LAB — CBC
HCT: 28.7 % — ABNORMAL LOW (ref 36.0–46.0)
Hemoglobin: 9.3 g/dL — ABNORMAL LOW (ref 12.0–15.0)
MCH: 32.2 pg (ref 26.0–34.0)
MCHC: 32.4 g/dL (ref 30.0–36.0)
MCV: 99.3 fL (ref 80.0–100.0)
Platelets: 238 10*3/uL (ref 150–400)
RBC: 2.89 MIL/uL — ABNORMAL LOW (ref 3.87–5.11)
RDW: 12.9 % (ref 11.5–15.5)
WBC: 12.6 10*3/uL — ABNORMAL HIGH (ref 4.0–10.5)
nRBC: 0 % (ref 0.0–0.2)

## 2022-08-11 LAB — BASIC METABOLIC PANEL
Anion gap: 6 (ref 5–15)
BUN: 28 mg/dL — ABNORMAL HIGH (ref 8–23)
CO2: 23 mmol/L (ref 22–32)
Calcium: 8.9 mg/dL (ref 8.9–10.3)
Chloride: 107 mmol/L (ref 98–111)
Creatinine, Ser: 1.47 mg/dL — ABNORMAL HIGH (ref 0.44–1.00)
GFR, Estimated: 37 mL/min — ABNORMAL LOW (ref >60)
Glucose, Bld: 202 mg/dL — ABNORMAL HIGH (ref 70–99)
Potassium: 5.4 mmol/L — ABNORMAL HIGH (ref 3.5–5.1)
Sodium: 136 mmol/L (ref 135–145)

## 2022-08-11 LAB — GLUCOSE, CAPILLARY
Glucose-Capillary: 141 mg/dL — ABNORMAL HIGH (ref 70–99)
Glucose-Capillary: 187 mg/dL — ABNORMAL HIGH (ref 70–99)
Glucose-Capillary: 120 mg/dL — ABNORMAL HIGH (ref 70–99)
Glucose-Capillary: 113 mg/dL — ABNORMAL HIGH (ref 70–99)
Glucose-Capillary: 159 mg/dL — ABNORMAL HIGH (ref 70–99)

## 2022-08-11 LAB — ECHOCARDIOGRAM LIMITED
Height: 62 in
Weight: 2432 oz

## 2022-08-11 LAB — HEMOGLOBIN A1C
Hgb A1c MFr Bld: 6.3 % — ABNORMAL HIGH (ref 4.8–5.6)
Mean Plasma Glucose: 134.11 mg/dL

## 2022-08-11 MED ORDER — METFORMIN HCL 500 MG PO TABS
500.0000 mg | ORAL_TABLET | Freq: Two times a day (BID) | ORAL | Status: DC
  Administered 2022-08-12 – 2022-08-13 (×4): 500 mg via ORAL
  Filled 2022-08-11 (×4): qty 1

## 2022-08-11 MED ORDER — PREDNISONE 20 MG PO TABS
40.0000 mg | ORAL_TABLET | Freq: Every day | ORAL | Status: DC
  Administered 2022-08-11 – 2022-08-14 (×4): 40 mg via ORAL
  Filled 2022-08-11 (×4): qty 2

## 2022-08-11 MED ORDER — METOPROLOL TARTRATE 12.5 MG HALF TABLET
12.5000 mg | ORAL_TABLET | Freq: Two times a day (BID) | ORAL | Status: DC
  Administered 2022-08-11 – 2022-08-13 (×6): 12.5 mg via ORAL
  Filled 2022-08-11 (×7): qty 1

## 2022-08-11 MED ORDER — MORPHINE SULFATE (PF) 2 MG/ML IV SOLN
2.0000 mg | Freq: Once | INTRAVENOUS | Status: AC
  Administered 2022-08-11: 2 mg via INTRAVENOUS
  Filled 2022-08-11: qty 1

## 2022-08-11 MED ORDER — IPRATROPIUM-ALBUTEROL 0.5-2.5 (3) MG/3ML IN SOLN
3.0000 mL | Freq: Two times a day (BID) | RESPIRATORY_TRACT | Status: DC
  Administered 2022-08-12: 3 mL via RESPIRATORY_TRACT
  Filled 2022-08-11 (×3): qty 3

## 2022-08-11 MED ORDER — ENOXAPARIN SODIUM 30 MG/0.3ML IJ SOSY
30.0000 mg | PREFILLED_SYRINGE | INTRAMUSCULAR | Status: DC
  Administered 2022-08-12 – 2022-08-14 (×3): 30 mg via SUBCUTANEOUS
  Filled 2022-08-11 (×3): qty 0.3

## 2022-08-11 MED ORDER — NITROGLYCERIN IN D5W 200-5 MCG/ML-% IV SOLN
0.0000 ug/min | INTRAVENOUS | Status: DC
  Administered 2022-08-11: 5 ug/min via INTRAVENOUS
  Administered 2022-08-12 – 2022-08-13 (×2): 55 ug/min via INTRAVENOUS
  Filled 2022-08-11 (×3): qty 250

## 2022-08-11 MED ORDER — SIMETHICONE 80 MG PO CHEW
80.0000 mg | CHEWABLE_TABLET | Freq: Four times a day (QID) | ORAL | Status: DC | PRN
  Administered 2022-08-11 – 2022-08-13 (×3): 80 mg via ORAL
  Filled 2022-08-11 (×4): qty 1

## 2022-08-11 MED ORDER — IPRATROPIUM-ALBUTEROL 0.5-2.5 (3) MG/3ML IN SOLN
3.0000 mL | RESPIRATORY_TRACT | Status: DC
  Filled 2022-08-11: qty 3

## 2022-08-11 MED ORDER — NITROGLYCERIN 0.4 MG SL SUBL
0.4000 mg | SUBLINGUAL_TABLET | SUBLINGUAL | Status: DC | PRN
  Administered 2022-08-11 (×3): 0.4 mg via SUBLINGUAL
  Filled 2022-08-11 (×3): qty 1

## 2022-08-11 MED ORDER — BUDESONIDE 0.25 MG/2ML IN SUSP
0.2500 mg | Freq: Two times a day (BID) | RESPIRATORY_TRACT | Status: DC
  Administered 2022-08-11 – 2022-08-12 (×2): 0.25 mg via RESPIRATORY_TRACT
  Filled 2022-08-11 (×4): qty 2

## 2022-08-11 MED ORDER — ALBUTEROL SULFATE (2.5 MG/3ML) 0.083% IN NEBU
2.5000 mg | INHALATION_SOLUTION | RESPIRATORY_TRACT | Status: DC | PRN
  Administered 2022-08-11: 2.5 mg via RESPIRATORY_TRACT
  Filled 2022-08-11: qty 3

## 2022-08-11 MED ORDER — ATORVASTATIN CALCIUM 80 MG PO TABS
80.0000 mg | ORAL_TABLET | Freq: Every day | ORAL | Status: DC
  Administered 2022-08-11 – 2022-08-14 (×4): 80 mg via ORAL
  Filled 2022-08-11 (×4): qty 1

## 2022-08-11 MED ORDER — ISOSORBIDE MONONITRATE ER 30 MG PO TB24
30.0000 mg | ORAL_TABLET | Freq: Every day | ORAL | Status: DC
  Administered 2022-08-11 – 2022-08-14 (×4): 30 mg via ORAL
  Filled 2022-08-11 (×4): qty 1

## 2022-08-11 MED ORDER — EMPAGLIFLOZIN 10 MG PO TABS
10.0000 mg | ORAL_TABLET | Freq: Every day | ORAL | Status: DC
  Administered 2022-08-11 – 2022-08-14 (×4): 10 mg via ORAL
  Filled 2022-08-11 (×4): qty 1

## 2022-08-11 MED ORDER — PERFLUTREN LIPID MICROSPHERE
1.0000 mL | INTRAVENOUS | Status: AC | PRN
  Administered 2022-08-11: 2 mL via INTRAVENOUS

## 2022-08-11 NOTE — Progress Notes (Signed)
CARDIAC REHAB PHASE I    Pt resting in bed, feeling ok this afternoon. C/o CP and fatigue, pt declined walk for now. Encouraged pt to ambulate later today with ICU staff. Post MI/stent education including site care, restrictions, risk factors, exercise guidelines, MI booklet, antiplatelet therapy importance, heart healthy diabetic diet and CRP2 reviewed. All questions and concerns addressed. Will refer to [facility] for CRP2. Will continue to follow.   [id number]   Woodroe Chen, RN BSN 08/11/2022 2:36 PM

## 2022-08-11 NOTE — Progress Notes (Signed)
Conversed with Cardiology NP Laverda Page at 1600 and PA Perlie Gold after 1700 for sustained chest pain rated 10/10 deeper with inspiration starting at 1600. HR in the 80s with oxygen sats greater than 95%, and MAPs greater than 65. Received orders for sublingual nitroglycerin and Imdur. SL nitro administered x 3 at 1710, 1727, and 1831. 2Mg  of morphine given at 1800 per PA Williams. Patient reported a temporary decrease in chest pain with each medication administration, but continued to return to a score of 8 each time. Reported this to PA Williams at 1900 and received order for EKG and Nitroglycerin gtt. EKG results given to PA Williams at bedside.

## 2022-08-11 NOTE — Progress Notes (Addendum)
Rounding Note    Patient Name: [name] Date of Encounter: 08/11/2022  [facility] Cardiologist: Donato Schultz, MD   Subjective   Patient complains of pleuritic chest pain this morning.  Postop day 1 RCA PCI and stenting complicated by small distal wire perforation.  Inpatient Medications    Scheduled Meds:  aspirin  324 mg Oral Once   aspirin  81 mg Oral Daily   Chlorhexidine Gluconate Cloth  6 each Topical Daily   colchicine  0.6 mg Oral BID   insulin aspart  0-15 Units Subcutaneous TID WC   ipratropium-albuterol  3 mL Nebulization Q4H   pantoprazole  40 mg Oral Daily   sodium chloride flush  3 mL Intravenous Q12H   ticagrelor  90 mg Oral BID   Continuous Infusions:  sodium chloride     PRN Meds: sodium chloride, acetaminophen, albuterol, ALPRAZolam, nitroGLYCERIN, ondansetron (ZOFRAN) IV, mouth rinse, perflutren lipid microspheres (DEFINITY) IV suspension, simethicone, sodium chloride flush   Vital Signs    Vitals:   08/11/22 0700 08/11/22 0800 08/11/22 0829 08/11/22 0900  BP: 128/80 139/80  137/69  Pulse: 97 93 89 85  Resp: (!) 23 (!) Temp:   97.7 F (36.5 C)   TempSrc:   Oral   SpO2: 96% 97% 98% 99%  Weight:      Height:        Intake/Output Summary (Last 24 hours) at 08/11/2022 0953 Last data filed at 08/10/2022 2200 Gross per 24 hour  Intake 1517.9 ml  Output --  Net 1517.9 ml      08/10/2022    4:55 AM 08/08/2022    3:49 PM 08/04/2022   10:54 AM  Last 3 Weights  Weight (lbs) 152 lb 150 lb 146 lb  Weight (kg) 68.947 kg 68.04 kg 66.225 kg      Telemetry    Sinus rhythm- Personally Reviewed  ECG    Normal sinus rhythm at 87 without ST or T wave changes.- Personally Reviewed  Physical Exam   GEN: No acute distress.   Neck: No JVD Cardiac: RRR, no murmurs, rubs, or gallops.  Respiratory: Clear to auscultation bilaterally. GI: Soft, nontender, non-distended  MS: No edema; No deformity. Neuro:  Nonfocal  Psych:  Normal affect   Labs    High Sensitivity Troponin:   Recent Labs  Lab 08/10/22 0507 08/10/22 0716  TROPONINIHS 223* 168*     Chemistry Recent Labs  Lab 08/10/22 0507 08/10/22 0517 08/11/22 0129  NA 137 139 136  K 4.0 3.8 5.4*  CL 104  --  107  CO2 22  --  23  GLUCOSE 121*  --  202*  BUN 23  --  28*  CREATININE 1.49*  --  1.47*  CALCIUM 9.5  --  8.9  GFRNONAA 37*  --  37*  ANIONGAP 11  --  6    Lipids No results for input(s): "CHOL", "TRIG", "HDL", "LABVLDL", "LDLCALC", "CHOLHDL" in the last 168 hours.  Hematology Recent Labs  Lab 08/10/22 0507 08/10/22 0517 08/11/22 0129  WBC 11.6*  --  12.6*  RBC 3.61*  --  2.89*  HGB 11.8* 11.2* 9.3*  HCT 36.5 33.0* 28.7*  MCV 101.1*  --  99.3  MCH 32.7  --  32.2  MCHC 32.3  --  32.4  RDW 12.6  --  12.9  PLT 271  --  238   Thyroid No results for input(s): "TSH", "FREET4" in the last 168 hours.  BNP Recent Labs  Lab 08/10/22 0507  BNP 74.4    DDimer No results for input(s): "DDIMER" in the last 168 hours.   Radiology    ECHOCARDIOGRAM LIMITED  Result Date: 08/11/2022    ECHOCARDIOGRAM LIMITED REPORT   Patient Name:   [name] Date of Exam: 08/11/2022 Medical Rec #:  [id number]      Height:       62.0 in Accession #:    [id number]     Weight:       152.0 lb Date of Birth:  [birth year]       BSA:          1.701 m Patient Age:    71 years       BP:           139/80 mmHg Patient Gender: F              HR:           89 bpm. Exam Location:  Inpatient Procedure: 2D Echo and Intracardiac Opacification Agent Indications:    Pericardial Effusion  History:        Patient has prior history of Echocardiogram examinations, most                 recent 08/10/2022. CHF, CAD and Acute MI; COPD. Pericardial                 effusion from wire perf on 08/10/22.  Sonographer:    Milbert Coulter Referring Phys: Laverda Page, B IMPRESSIONS  1. Windows are callenging. Left ventricular ejection fraction, by estimation, is 60 to 65%. The left  ventricle has normal function.  2. Right ventricular systolic function is normal. The right ventricular size is normal.  3. A small pericardial effusion is present. There is no evidence of cardiac tamponade. Comparison(s): No significant change from prior study. FINDINGS  Left Ventricle: Windows are callenging. Left ventricular ejection fraction, by estimation, is 60 to 65%. The left ventricle has normal function. Definity contrast agent was given IV to delineate the left ventricular endocardial borders. Right Ventricle: The right ventricular size is normal. Right ventricular systolic function is normal. Pericardium: A small pericardial effusion is present. There is no evidence of cardiac tamponade. Carolan Clines Electronically signed by Carolan Clines Signature Date/Time: 08/11/2022/8:29:49 AM    Final    ECHOCARDIOGRAM COMPLETE  Result Date: 08/10/2022    ECHOCARDIOGRAM REPORT   Patient Name:   [name] Date of Exam: 08/10/2022 Medical Rec #:  [id number]      Height:       62.0 in Accession #:    [id number]     Weight:       152.0 lb Date of Birth:  [birth year]       BSA:          1.701 m Patient Age:    71 years       BP:           107/74 mmHg Patient Gender: F              HR:           95 bpm. Exam Location:  Inpatient Procedure: 2D Echo, Cardiac Doppler and Color Doppler Indications:    Chest pain  History:        Patient has prior history of Echocardiogram examinations, most                 recent 08/10/2022. CHF, CAD  and Acute MI; COPD.  Sonographer:    Milbert Coulter Referring Phys: [id number] SHENG L HALEY  Sonographer Comments: Image acquisition challenging due to respiratory motion and patient sensitive to probe contact. IMPRESSIONS  1. Left ventricular ejection fraction, by estimation, is 55 to 60%. The left ventricle has normal function. The left ventricle has no regional wall motion abnormalities. There is severe concentric left ventricular hypertrophy. Left ventricular diastolic  parameters are consistent  with Grade I diastolic dysfunction (impaired relaxation). Elevated left ventricular end-diastolic pressure.  2. Right ventricular systolic function is normal. The right ventricular size is normal.  3. The mitral valve is normal in structure. No evidence of mitral valve regurgitation. No evidence of mitral stenosis.  4. The aortic valve is normal in structure. Aortic valve regurgitation is not visualized. No aortic stenosis is present.  5. The inferior vena cava is normal in size with greater than 50% respiratory variability, suggesting right atrial pressure of 3 mmHg.  6. A small pericardial effusion is present. The pericardial effusion is circumferential. This is new compared to prior echo earier in the day. FINDINGS  Left Ventricle: Left ventricular ejection fraction, by estimation, is 55 to 60%. The left ventricle has normal function. The left ventricle has no regional wall motion abnormalities. The left ventricular internal cavity size was normal in size. There is  severe concentric left ventricular hypertrophy. Left ventricular diastolic parameters are consistent with Grade I diastolic dysfunction (impaired relaxation). Elevated left ventricular end-diastolic pressure. Right Ventricle: The right ventricular size is normal. No increase in right ventricular wall thickness. Right ventricular systolic function is normal. Left Atrium: Left atrial size was normal in size. Right Atrium: Right atrial size was normal in size. Pericardium: A small pericardial effusion is present. The pericardial effusion is circumferential. Mitral Valve: The mitral valve is normal in structure. No evidence of mitral valve regurgitation. No evidence of mitral valve stenosis. Tricuspid Valve: The tricuspid valve is normal in structure. Tricuspid valve regurgitation is trivial. No evidence of tricuspid stenosis. Aortic Valve: The aortic valve is normal in structure. Aortic valve regurgitation is not visualized. No aortic stenosis is  present. Aortic valve mean gradient measures 4.0 mmHg. Aortic valve peak gradient measures 7.2 mmHg. Aortic valve area, by VTI measures 1.99 cm. Pulmonic Valve: The pulmonic valve was normal in structure. Pulmonic valve regurgitation is not visualized. No evidence of pulmonic stenosis. Aorta: The aortic root is normal in size and structure. Venous: The inferior vena cava is normal in size with greater than 50% respiratory variability, suggesting right atrial pressure of 3 mmHg. IAS/Shunts: No atrial level shunt detected by color flow Doppler.  LEFT VENTRICLE PLAX 2D LVIDd:         2.70 cm     Diastology LVIDs:         1.90 cm     LV e' medial:    3.81 cm/s LV PW:         1.50 cm     LV E/e' medial:  17.2 LV IVS:        1.50 cm     LV e' lateral:   5.33 cm/s LVOT diam:     1.90 cm     LV E/e' lateral: 12.3 LV SV:         46 LV SV Index:   27 LVOT Area:     2.84 cm  LV Volumes (MOD) LV vol d, MOD A2C: 29.8 ml LV vol d, MOD A4C: 47.3 ml LV vol s, MOD  A2C: 14.0 ml LV vol s, MOD A4C: 20.1 ml LV SV MOD A2C:     15.8 ml LV SV MOD A4C:     47.3 ml LV SV MOD BP:      21.3 ml RIGHT VENTRICLE RV S prime:     15.00 cm/s TAPSE (M-mode): 1.6 cm LEFT ATRIUM             Index LA Vol (A2C):   30.9 ml 18.16 ml/m LA Vol (A4C):   24.7 ml 14.52 ml/m LA Biplane Vol: 28.6 ml 16.81 ml/m  AORTIC VALVE AV Area (Vmax):    1.95 cm AV Area (Vmean):   1.92 cm AV Area (VTI):     1.99 cm AV Vmax:           134.00 cm/s AV Vmean:          88.200 cm/s AV VTI:            0.232 m AV Peak Grad:      7.2 mmHg AV Mean Grad:      4.0 mmHg LVOT Vmax:         92.20 cm/s LVOT Vmean:        59.800 cm/s LVOT VTI:          0.163 m LVOT/AV VTI ratio: 0.70 MITRAL VALVE MV Area (PHT): 4.31 cm     SHUNTS MV Decel Time: 176 msec     Systemic VTI:  0.16 m MV E velocity: 65.40 cm/s   Systemic Diam: 1.90 cm MV A velocity: 115.00 cm/s MV E/A ratio:  0.57 Armanda Magic MD Electronically signed by Armanda Magic MD Signature Date/Time: 08/10/2022/2:58:29 PM    Final     ECHOCARDIOGRAM LIMITED  Result Date: 08/10/2022    ECHOCARDIOGRAM LIMITED REPORT   Patient Name:   DANYELL KROLAK Date of Exam: 08/10/2022 Medical Rec #:  [id number]      Height:       62.0 in Accession #:    [id number]     Weight:       152.0 lb Date of Birth:  1950-10-19       BSA:          1.701 m Patient Age:    71 years       BP:           149/72 mmHg Patient Gender: F              HR:           95 bpm. Exam Location:  Inpatient Procedure: Limited Echo Indications:    I31.3 Pericardial effusion (noninflammatory)  History:        Patient has prior history of Echocardiogram examinations, most                 recent 08/13/2017. CHF, CAD, COPD; Risk Factors:Hypertension,                 Diabetes and Dyslipidemia.  Sonographer:    Irving Burton Senior RDCS Referring Phys: Orbie Pyo  Sonographer Comments: STAT limited at table with Dr. Lynnette Caffey IMPRESSIONS  1. Left ventricular ejection fraction, by estimation, is 60 to 65%. The left ventricle has normal function. The left ventricle has no regional wall motion abnormalities. Left ventricular diastolic function could not be evaluated.  2. Right ventricular systolic function is normal. The right ventricular size is normal.  3. The mitral valve is normal in structure.  4. The aortic valve was not assessed.  5.  No pericardial effusion FINDINGS  Left Ventricle: Left ventricular ejection fraction, by estimation, is 60 to 65%. The left ventricle has normal function. The left ventricle has no regional wall motion abnormalities. The left ventricular internal cavity size was normal in size. There is  no left ventricular hypertrophy. Left ventricular diastolic function could not be evaluated. Right Ventricle: The right ventricular size is normal. No increase in right ventricular wall thickness. Right ventricular systolic function is normal. Left Atrium: Left atrial size was normal in size. Right Atrium: Right atrial size was normal in size. Pericardium: There is no evidence of  pericardial effusion. Mitral Valve: The mitral valve is normal in structure. Tricuspid Valve: The tricuspid valve is normal in structure. Tricuspid valve regurgitation is not demonstrated. No evidence of tricuspid stenosis. Aortic Valve: The aortic valve was not assessed. Pulmonic Valve: The pulmonic valve was not assessed. Aorta: The aortic root is normal in size and structure. Venous: The inferior vena cava was not well visualized. IAS/Shunts: No atrial level shunt detected by color flow Doppler. Armanda Magic MD Electronically signed by Armanda Magic MD Signature Date/Time: 08/10/2022/12:54:19 PM    Final    CARDIAC CATHETERIZATION  Result Date: 08/10/2022   Prox RCA to Dist RCA lesion is 10% stenosed.   Prox RCA lesion is 75% stenosed.   RPAV lesion is 99% stenosed.   A stent was successfully placed.   A stent was successfully placed.   A stent was successfully placed.   Post intervention, there is a 0% residual stenosis.   Post intervention, there is a 0% residual stenosis.   Post intervention, there is a 0% residual stenosis. 1.  High-grade proximal and ostial right coronary artery disease treated with shockwave lithotripsy and 1 drug-eluting stent. 2.  High-grade distal right coronary artery disease treated with 1 drug-eluting stent with plaque shift into the jailed PDA branch.  This was complicated by a small distal wire perforation.  The patient was not hypotensive and a stat echocardiogram obtained in the cardiac catheterization laboratory demonstrated no significant pericardial effusion. 3.  Patent proximal and mid right coronary artery stents. 4.  Mild disease of the LAD. 5.  LVEDP of 14 mmHg. Recommendation: Dual antiplatelet therapy for at least 1 year.  Echocardiogram will be done later today to assess for pericardial effusion.  The case was reviewed with Dr. Gery Pray the team C attending.   CT Angio Chest PE W and/or Wo Contrast  Result Date: 08/10/2022 CLINICAL DATA:  Dyspnea, chronic, chest wall  or pleura disease suspected. EXAM: CT ANGIOGRAPHY CHEST WITH CONTRAST TECHNIQUE: Multidetector CT imaging of the chest was performed using the standard protocol during bolus administration of intravenous contrast. Multiplanar CT image reconstructions and MIPs were obtained to evaluate the vascular anatomy. RADIATION DOSE REDUCTION: This exam was performed according to the departmental dose-optimization program which includes automated exposure control, adjustment of the mA and/or kV according to patient size and/or use of iterative reconstruction technique. CONTRAST:  50mL OMNIPAQUE IOHEXOL 350 MG/ML SOLN COMPARISON:  Chest radiograph 08/10/2022 and chest CTA 03/31/2022 and MR abdomen 04/20/2022 FINDINGS: Cardiovascular: Satisfactory opacification of the pulmonary arteries to the segmental level. No evidence of pulmonary embolism. Normal heart size. No pericardial effusion. Normal caliber of the thoracic aorta. Diffuse coronary artery calcifications. Great vessels are patent. Celiac trunk and proximal SMA are patent in the upper abdomen. Mediastinum/Nodes: No mediastinal, hilar or axillary lymphadenopathy. Esophagus is unremarkable. Thyroid tissue is unremarkable. Lungs/Pleura: Trachea and mainstem bronchi are patent. Patchy densities in lower  lobes, right side greater than left. Favor atelectasis in this area. No large areas of consolidation. No pleural effusions. Subpleural nodule in the anterior right upper lobe measures 3 mm on image 55/6 and this is stable since 08/03/2016 and compatible with a benign nodule. Upper Abdomen: Again noted is an exophytic low-density structure along the anterior left kidney upper pole that was previously described as a cyst on the abdominal MRI. No acute abnormality. Musculoskeletal: No acute bone abnormality. Review of the MIP images confirms the above findings. IMPRESSION: 1. Negative for a pulmonary embolism. 2. Patchy densities in the lower lobes, right side greater than left.  Findings are most compatible with atelectasis. No large areas of consolidation or pleural effusions. 3. Coronary artery calcifications. 4. Stable 3 mm subpleural nodule in the right upper lobe. This nodule is stable since 08/03/2016 and compatible with a benign nodule. Electronically Signed   By: Richarda Overlie M.D.   On: 08/10/2022 07:14   DG Chest Portable 1 View  Result Date: 08/10/2022 CLINICAL DATA:  Shortness of breath EXAM: PORTABLE CHEST 1 VIEW COMPARISON:  03/31/2022 FINDINGS: Artifact from EKG leads. Normal heart size and mediastinal contours. Minimal streaky density at the left diaphragm. No acute infiltrate or edema. No effusion or pneumothorax. No acute osseous findings. IMPRESSION: Minimal atelectasis on the left.  Otherwise stable from 2023. Electronically Signed   By: Tiburcio Pea M.D.   On: 08/10/2022 05:36    Cardiac Studies   2D echocardiogram (08/11/2022)  IMPRESSIONS     1. Windows are callenging. Left ventricular ejection fraction, by  estimation, is 60 to 65%. The left ventricle has normal function.   2. Right ventricular systolic function is normal. The right ventricular  size is normal.   3. A small pericardial effusion is present. There is no evidence of  cardiac tamponade.   Comparison(s): No significant change from prior study.    Cardiac catheterization/PCI and stent (08/10/2022)   Conclusion      Prox RCA to Dist RCA lesion is 10% stenosed.   Prox RCA lesion is 75% stenosed.   RPAV lesion is 99% stenosed.   A stent was successfully placed.   A stent was successfully placed.   A stent was successfully placed.   Post intervention, there is a 0% residual stenosis.   Post intervention, there is a 0% residual stenosis.   Post intervention, there is a 0% residual stenosis.   1.  High-grade proximal and ostial right coronary artery disease treated with shockwave lithotripsy and 1 drug-eluting stent. 2.  High-grade distal right coronary artery disease  treated with 1 drug-eluting stent with plaque shift into the jailed PDA branch.  This was complicated by a small distal wire perforation.  The patient was not hypotensive and a stat echocardiogram obtained in the cardiac catheterization laboratory demonstrated no significant pericardial effusion. 3.  Patent proximal and mid right coronary artery stents. 4.  Mild disease of the LAD. 5.  LVEDP of 14 mmHg.   Recommendation: Dual antiplatelet therapy for at least 1 year.  Echocardiogram will be done later today to assess for pericardial effusion.  The case was reviewed with Dr. Gery Pray the team C attending.  Coronary Diagrams  Diagnostic Dominance: Right  Intervention     Patient Profile     ADDALEIGH NICHOLLS is a 72 y.o. female with past medical history of CAD status post DES x 2 to RCA '17, hypertension, hyperlipidemia, GERD, diabetes who is being seen 08/10/2022 for the evaluation of  chest pain/inferior STEMI.   Assessment & Plan    1: Inferior STEMI-had PCI and stenting of her RCA by Dr. Clifton James in 2017.  She was awakened at 4 AM with chest pain and came to the ER where she had inferior ST segment elevation.  She was taken urgently to the Cath Lab by Dr. Lynnette Caffey via the right radial approach and had fairly complex proximal RCA shockwave angioplasty followed by stenting and distal RCA stenting complicated by small distal perforation and a distal posterolateral branch.  Stat echo initially showed no pericardial effusion, 2 subsequent echoes the most recent this morning showed a small circumferential pericardial effusion that was stable.  She is on DAPT including aspirin and Brilinta.  She does have some pleuritic chest pain this morning.  Her troponins were fairly low in the 200 range.  2: Essential hypertension-blood pressure currently 137/69.  She was on multiple antihypertensive medications as an outpatient.  Will start low-dose metoprolol and additional medications as clinically warranted  3:  Hyperlipidemia-on atorvastatin 40 mg, will increase to 80 mg  4: Diabetes-on metformin as an outpatient.  Currently getting sliding scale insulin.  Will restart metformin 48 hours post cath  5: CKD 3A-serum creatinine 1.47 which is near her baseline.     For questions or updates, please contact [facility] Please consult [email or web address] for contact info under        Signed, Nanetta Batty, MD  08/11/2022, 9:53 AM

## 2022-08-11 NOTE — Progress Notes (Signed)
Pt resting in bed, no respiratory distress noted. No BIPAP needed at this time. RT will monitor.

## 2022-08-11 NOTE — Progress Notes (Signed)
   Received a page from nursing staff reporting patient with significant chest pain this evening. Also had discomfort earlier in the afternoon and was given Imdur 30mg . Patient received sublingual nitro x 3 at 1710, 1727, reported moderate/temporary relief of pain.  I ordered 2 mg of morphine, and patient received an additional dose of sublingual nitroglycerin at 1831.  Patient again reported a temporary decrease in chest pain before discomfort returned to an 8 out of 10. Given jailed PDA branch as well as small distal wire perforation during heart catheterization yesterday, pain likely to be of mixed etiology.  Of note, Limited echocardiogram today showed a small pericardial effusion, no evidence of tamponade.  Due to recurring discomfort and patient already being on colchicine 0.6 mg twice daily dosing, titrated IV infusion of nitroglycerin ordered.  Stat ECG was without acute ischemic change.  Patient also physically examined by myself, no muffled heart sounds, no abnormal lung sounds.  Vital signs appeared stable with sinus rhythm, slight hypertension.  No physical exam evidence of tamponade.  Clinically patient appeared uncomfortable, reported constant chest pain made worse with deep inspiration.  Patient discussed with Dr. Wyline Mood.  Given that on my exam discomfort appeared to be more pericardial in nature, will add prednisone 40 mg daily x 5 days.  First dose to be given tonight.  Patient situation/symptoms reviewed with overnight cardiology coverage prior to my departure.  Perlie Gold, PA-C

## 2022-08-11 NOTE — TOC Benefit Eligibility Note (Signed)
Patient Product/process development scientist completed.    The patient is currently admitted and upon discharge could be taking Brilinta 90 mg.  The current 30 day co-pay is $25.00.   The patient is currently admitted and upon discharge could be taking colchicine 0.6 mg.  The current 30 day co-pay is $10.00.   The patient is insured through [employer]   This test claim was processed through [facility]- copay amounts may vary at other pharmacies due to pharmacy/plan contracts, or as the patient moves through the different stages of their insurance plan.  Roland Earl, CPHT Pharmacy Patient Advocate Specialist [facility] Patient Advocate Team Direct Number: [phone]  Fax: [phone]

## 2022-08-12 LAB — LIPID PANEL
Cholesterol: 135 mg/dL (ref 0–200)
HDL: 50 mg/dL (ref >40)
LDL Cholesterol: 47 mg/dL (ref 0–99)
Total CHOL/HDL Ratio: 2.7 RATIO
Triglycerides: 188 mg/dL — ABNORMAL HIGH (ref <150)
VLDL: 38 mg/dL (ref 0–40)

## 2022-08-12 LAB — GLUCOSE, CAPILLARY
Glucose-Capillary: 164 mg/dL — ABNORMAL HIGH (ref 70–99)
Glucose-Capillary: 125 mg/dL — ABNORMAL HIGH (ref 70–99)
Glucose-Capillary: 172 mg/dL — ABNORMAL HIGH (ref 70–99)
Glucose-Capillary: 155 mg/dL — ABNORMAL HIGH (ref 70–99)

## 2022-08-12 LAB — LIPOPROTEIN A (LPA): Lipoprotein (a): 169.8 nmol/L — ABNORMAL HIGH (ref <75.0)

## 2022-08-12 MED ORDER — MORPHINE SULFATE (PF) 2 MG/ML IV SOLN
2.0000 mg | Freq: Two times a day (BID) | INTRAVENOUS | Status: DC | PRN
  Administered 2022-08-12 – 2022-08-14 (×4): 2 mg via INTRAVENOUS
  Filled 2022-08-12 (×4): qty 1

## 2022-08-12 MED ORDER — MORPHINE SULFATE (PF) 2 MG/ML IV SOLN
2.0000 mg | Freq: Once | INTRAVENOUS | Status: AC
  Administered 2022-08-12: 2 mg via INTRAVENOUS
  Filled 2022-08-12: qty 1

## 2022-08-12 NOTE — Evaluation (Signed)
Clinical/Bedside Swallow Evaluation Patient Details  Name: [name] MRN: [id number] Date of Birth: 1951-01-13  Today's Date: 08/12/2022 Time: SLP Start Time (ACUTE ONLY): 1510 SLP Stop Time (ACUTE ONLY): 1530 SLP Time Calculation (min) (ACUTE ONLY): 20 min  Past Medical History:  Past Medical History:  Diagnosis Date   Anxiety    Arthritis    Asthma    Chronic diastolic CHF (congestive heart failure) 07/17/2016   Coronary artery disease    a. NSTEMI 12/2015 - LHC 01/18/16: s/p overlapping DESx2 to RCA, 10% ost-prox Cx,10% mLAD.   Dementia    Depression    Diabetes mellitus    Dyslipidemia    GERD (gastroesophageal reflux disease)    History of seizure    Hypertension    Hypertensive heart disease    NSTEMI (non-ST elevated myocardial infarction) 12/2015   Stage 3 chronic kidney disease    Past Surgical History:  Past Surgical History:  Procedure Laterality Date   ABDOMINAL HYSTERECTOMY     CARDIAC CATHETERIZATION  1994   minimal LAD dz, no other dz, EF normal   CARDIAC CATHETERIZATION N/A 01/18/2016   Procedure: Left Heart Cath and Coronary Angiography;  Surgeon: Kathleene Hazel, MD;  Location: [facility] INVASIVE CV LAB;  Service: Cardiovascular;  Laterality: N/A;   CARDIAC CATHETERIZATION N/A 01/18/2016   Procedure: Coronary Stent Intervention;  Surgeon: Kathleene Hazel, MD;  Location: [facility] INVASIVE CV LAB;  Service: Cardiovascular;  Laterality: N/A;   COLONOSCOPY WITH PROPOFOL N/A 04/09/2014   Procedure: COLONOSCOPY WITH PROPOFOL;  Surgeon: Florencia Reasons, MD;  Location: [facility] ENDOSCOPY;  Service: Endoscopy;  Laterality: N/A;   CORONARY LITHOTRIPSY N/A 08/10/2022   Procedure: CORONARY LITHOTRIPSY;  Surgeon: Orbie Pyo, MD;  Location: [facility] INVASIVE CV LAB;  Service: Cardiovascular;  Laterality: N/A;   CORONARY STENT INTERVENTION N/A 08/10/2022   Procedure: CORONARY STENT INTERVENTION;  Surgeon: Orbie Pyo, MD;  Location: [facility] INVASIVE CV LAB;  Service:  Cardiovascular;  Laterality: N/A;   CORONARY STENT PLACEMENT  01/19/2016   STENT SYNERGY DES 6.29B28 drug eluting stent was successfully placed, and overlaps the 2.5 x 38 mm Synergy stent placed distally.   CORONARY/GRAFT ACUTE MI REVASCULARIZATION N/A 08/10/2022   Procedure: Coronary/Graft Acute MI Revascularization;  Surgeon: Orbie Pyo, MD;  Location: [facility] INVASIVE CV LAB;  Service: Cardiovascular;  Laterality: N/A;   ESOPHAGOGASTRODUODENOSCOPY (EGD) WITH PROPOFOL N/A 04/09/2014   Procedure: ESOPHAGOGASTRODUODENOSCOPY (EGD) WITH PROPOFOL;  Surgeon: Florencia Reasons, MD;  Location: [facility] ENDOSCOPY;  Service: Endoscopy;  Laterality: N/A;   FOOT SURGERY Bilateral    approx ~2000   HEMORRHOID SURGERY     HERNIA REPAIR     KNEE ARTHROSCOPY     LEFT HEART CATH AND CORONARY ANGIOGRAPHY N/A 08/10/2022   Procedure: LEFT HEART CATH AND CORONARY ANGIOGRAPHY;  Surgeon: Orbie Pyo, MD;  Location: [facility] INVASIVE CV LAB;  Service: Cardiovascular;  Laterality: N/A;   RADIOACTIVE SEED GUIDED EXCISIONAL BREAST BIOPSY Left 01/20/2021   Procedure: RADIOACTIVE SEED GUIDED EXCISIONAL LEFT BREAST BIOPSY;  Surgeon: Almond Lint, MD;  Location: [facility] OR;  Service: General;  Laterality: Left;   HPI:  [name] is a 72 y.o. female with past medical history of CAD status post DES x 2 to RCA '17, hypertension, hyperlipidemia, GERD, diabetes who is being seen 08/10/2022 for the evaluation of chest pain/inferior STEMI. S/p RCA stenting complicated by distal wire perforation.  New complaints of solid food dysphagia.    Assessment / Plan / Recommendation  Clinical Impression  Patient presents with a suspected primary esophageal dysphagia characterized by consistent c/o mid sternal chest pain across consistencies with no observable oropharyngeal deficits. Oral phase appropriate, full oral clearance, and no overt s/s of aspiration with liquids or solids. Note that patient has a h/o GERD as well as esophageal stenosis. She  reports h/o esophageal stretching more than 5 years ago with similar symptoms. No further SLP needs indicated. May benefit from GI consult. Could proceed with esophagram to assess esophageal motility however unclear if this will show any new deficits as study done in 2022 was largely normal. Endoscopy could be beneficial if patient able to tolerate medically at this time? Again, would defer utlimate decision to GI. Will place on full liquids in attempts to maximize po intake and advance per MD.   SLP Visit Diagnosis: Dysphagia, unspecified (R13.10)       Diet Recommendation Thin liquid (full liquids)   Liquid Administration via: Cup;Straw Medication Administration: Crushed with puree Supervision: Patient able to self feed;Intermittent supervision to cue for compensatory strategies Compensations: Slow rate;Small sips/bites;Follow solids with liquid Postural Changes: Seated upright at 90 degrees;Remain upright for at least 30 minutes after po intake    Other  Recommendations Recommended Consults: Consider GI evaluation;Consider esophageal assessment Oral Care Recommendations: Oral care BID    Recommendations for follow up therapy are one component of a multi-disciplinary discharge planning process, led by the attending physician.  Recommendations may be updated based on patient status, additional functional criteria and insurance authorization.    Swallow Study   General HPI: [name] is a 72 y.o. female with past medical history of CAD status post DES x 2 to RCA '17, hypertension, hyperlipidemia, GERD, diabetes who is being seen 08/10/2022 for the evaluation of chest pain/inferior STEMI. S/p RCA stenting complicated by distal wire perforation.  New complaints of solid food dysphagia. Type of Study: Bedside Swallow Evaluation Previous Swallow Assessment: seen by OP SLP in 2023 for VCD, esophagram in 2022 with Near normal study. Trace gastroesophageal reflux without evidence of  stricture or  ulceration. Diet Prior to this Study: Regular;Thin liquids (Level 0) Temperature Spikes Noted: No Respiratory Status: Room air History of Recent Intubation: No Behavior/Cognition: Alert;Cooperative;Pleasant mood Oral Cavity Assessment: Within Functional Limits Oral Care Completed by SLP: No Oral Cavity - Dentition: Adequate natural dentition Vision: Functional for self-feeding Self-Feeding Abilities: Able to feed self Patient Positioning: Upright in bed Baseline Vocal Quality: Normal Volitional Cough: Strong Volitional Swallow: Able to elicit    Oral/Motor/Sensory Function Overall Oral Motor/Sensory Function: Within functional limits   Ice Chips Ice chips: Not tested   Thin Liquid Thin Liquid: Impaired Presentation: Straw;Self Fed Other Comments: mid sternal chest pain    Nectar Thick Nectar Thick Liquid: Not tested   Honey Thick Honey Thick Liquid: Not tested   Puree Puree: Impaired Presentation: Self Fed;Spoon Other Comments: mid sternal chest pain   Solid     Solid: Impaired Presentation: Self Fed Other Comments: mid sternal chest pain     Debbie Bellucci MA, CCC-SLP  Jase Himmelberger Meryl 08/12/2022,3:43 PM

## 2022-08-12 NOTE — Progress Notes (Signed)
Rounding Note    Patient Name: [name] Date of Encounter: 08/12/2022   [facility] Cardiologist: Donato Schultz, MD   Subjective   Continues to complain of pleuritic chest pain.  Currently on nitroglycerin and colchicine twice daily.  Postop day 2 from RCA stenting.  This was complicated by distal wire perforation.  She is having trouble swallowing with discomfort as well.  She can swallow liquids, but is having trouble with solid food.  Inpatient Medications    Scheduled Meds:  aspirin  81 mg Oral Daily   atorvastatin  80 mg Oral Daily   budesonide (PULMICORT) nebulizer solution  0.25 mg Nebulization BID   Chlorhexidine Gluconate Cloth  6 each Topical Daily   colchicine  0.6 mg Oral BID   empagliflozin  10 mg Oral Daily   enoxaparin (LOVENOX) injection  30 mg Subcutaneous Q24H   insulin aspart  0-15 Units Subcutaneous TID WC   ipratropium-albuterol  3 mL Nebulization BID   isosorbide mononitrate  30 mg Oral Daily   metFORMIN  500 mg Oral BID WC   metoprolol tartrate  12.5 mg Oral BID   pantoprazole  40 mg Oral Daily   predniSONE  40 mg Oral Daily   sodium chloride flush  3 mL Intravenous Q12H   ticagrelor  90 mg Oral BID   Continuous Infusions:  sodium chloride     nitroGLYCERIN 55 mcg/min (08/12/22 0800)   PRN Meds: sodium chloride, acetaminophen, albuterol, ALPRAZolam, nitroGLYCERIN, ondansetron (ZOFRAN) IV, mouth rinse, simethicone, sodium chloride flush   Vital Signs    Vitals:   08/12/22 0739 08/12/22 0745 08/12/22 0800 08/12/22 0812  BP:  126/76 132/78   Pulse:  86 89   Resp:  20 20   Temp: 98.6 F (37 C)     TempSrc: Oral     SpO2:  97% 96% 97%  Weight:      Height:        Intake/Output Summary (Last 24 hours) at 08/12/2022 0842 Last data filed at 08/12/2022 0800 Gross per 24 hour  Intake 147.23 ml  Output --  Net 147.23 ml       08/12/2022    3:20 AM 08/10/2022    4:55 AM 08/08/2022    3:49 PM  Last 3 Weights  Weight (lbs) 148 lb  5.9 oz 152 lb 150 lb  Weight (kg) 67.3 kg 68.947 kg 68.04 kg      Telemetry    Sinus rhythm-personally reviewed  ECG    None new  Physical Exam   GEN: Well nourished, well developed, in no acute distress  HEENT: normal  Neck: no JVD, carotid bruits, or masses Cardiac: RRR; no murmurs, rubs, or gallops,no edema  Respiratory:  clear to auscultation bilaterally, normal work of breathing GI: soft, nontender, nondistended, + BS MS: no deformity or atrophy  Skin: warm and dry Neuro:  Strength and sensation are intact Psych: euthymic mood, full affect   Labs    High Sensitivity Troponin:   Recent Labs  Lab 08/10/22 0507 08/10/22 0716  TROPONINIHS 223* 168*      Chemistry Recent Labs  Lab 08/10/22 0507 08/10/22 0517 08/11/22 0129  NA 137 139 136  K 4.0 3.8 5.4*  CL 104  --  107  CO2 22  --  23  GLUCOSE 121*  --  202*  BUN 23  --  28*  CREATININE 1.49*  --  1.47*  CALCIUM 9.5  --  8.9  GFRNONAA 37*  --  37*  ANIONGAP 11  --  6     Lipids  Recent Labs  Lab 08/12/22 0035  CHOL 135  TRIG 188*  HDL 50  LDLCALC 47  CHOLHDL 2.7    Hematology Recent Labs  Lab 08/10/22 0507 08/10/22 0517 08/11/22 0129  WBC 11.6*  --  12.6*  RBC 3.61*  --  2.89*  HGB 11.8* 11.2* 9.3*  HCT 36.5 33.0* 28.7*  MCV 101.1*  --  99.3  MCH 32.7  --  32.2  MCHC 32.3  --  32.4  RDW 12.6  --  12.9  PLT 271  --  238    Thyroid No results for input(s): "TSH", "FREET4" in the last 168 hours.  BNP Recent Labs  Lab 08/10/22 0507  BNP 74.4     DDimer No results for input(s): "DDIMER" in the last 168 hours.   Radiology    ECHOCARDIOGRAM LIMITED  Result Date: 08/11/2022    ECHOCARDIOGRAM LIMITED REPORT   Patient Name:   [name] Date of Exam: 08/11/2022 Medical Rec #:  [id number]      Height:       62.0 in Accession #:    [id number]     Weight:       152.0 lb Date of Birth:  1950-08-21       BSA:          1.701 m Patient Age:    71 years       BP:           139/80 mmHg  Patient Gender: F              HR:           89 bpm. Exam Location:  Inpatient Procedure: 2D Echo and Intracardiac Opacification Agent Indications:    Pericardial Effusion  History:        Patient has prior history of Echocardiogram examinations, most                 recent 08/10/2022. CHF, CAD and Acute MI; COPD. Pericardial                 effusion from wire perf on 08/10/22.  Sonographer:    Milbert Coulter Referring Phys: Laverda Page, B IMPRESSIONS  1. Windows are callenging. Left ventricular ejection fraction, by estimation, is 60 to 65%. The left ventricle has normal function.  2. Right ventricular systolic function is normal. The right ventricular size is normal.  3. A small pericardial effusion is present. There is no evidence of cardiac tamponade. Comparison(s): No significant change from prior study. FINDINGS  Left Ventricle: Windows are callenging. Left ventricular ejection fraction, by estimation, is 60 to 65%. The left ventricle has normal function. Definity contrast agent was given IV to delineate the left ventricular endocardial borders. Right Ventricle: The right ventricular size is normal. Right ventricular systolic function is normal. Pericardium: A small pericardial effusion is present. There is no evidence of cardiac tamponade. Carolan Clines Electronically signed by Carolan Clines Signature Date/Time: 08/11/2022/8:29:49 AM    Final    ECHOCARDIOGRAM COMPLETE  Result Date: 08/10/2022    ECHOCARDIOGRAM REPORT   Patient Name:   [name] Date of Exam: 08/10/2022 Medical Rec #:  [id number]      Height:       62.0 in Accession #:    [id number]     Weight:       152.0 lb Date of Birth:  1950/12/10  BSA:          1.701 m Patient Age:    71 years       BP:           107/74 mmHg Patient Gender: F              HR:           95 bpm. Exam Location:  Inpatient Procedure: 2D Echo, Cardiac Doppler and Color Doppler Indications:    Chest pain  History:        Patient has prior history of Echocardiogram  examinations, most                 recent 08/10/2022. CHF, CAD and Acute MI; COPD.  Sonographer:    Milbert Coulter Referring Phys: [id number] SHENG L HALEY  Sonographer Comments: Image acquisition challenging due to respiratory motion and patient sensitive to probe contact. IMPRESSIONS  1. Left ventricular ejection fraction, by estimation, is 55 to 60%. The left ventricle has normal function. The left ventricle has no regional wall motion abnormalities. There is severe concentric left ventricular hypertrophy. Left ventricular diastolic  parameters are consistent with Grade I diastolic dysfunction (impaired relaxation). Elevated left ventricular end-diastolic pressure.  2. Right ventricular systolic function is normal. The right ventricular size is normal.  3. The mitral valve is normal in structure. No evidence of mitral valve regurgitation. No evidence of mitral stenosis.  4. The aortic valve is normal in structure. Aortic valve regurgitation is not visualized. No aortic stenosis is present.  5. The inferior vena cava is normal in size with greater than 50% respiratory variability, suggesting right atrial pressure of 3 mmHg.  6. A small pericardial effusion is present. The pericardial effusion is circumferential. This is new compared to prior echo earier in the day. FINDINGS  Left Ventricle: Left ventricular ejection fraction, by estimation, is 55 to 60%. The left ventricle has normal function. The left ventricle has no regional wall motion abnormalities. The left ventricular internal cavity size was normal in size. There is  severe concentric left ventricular hypertrophy. Left ventricular diastolic parameters are consistent with Grade I diastolic dysfunction (impaired relaxation). Elevated left ventricular end-diastolic pressure. Right Ventricle: The right ventricular size is normal. No increase in right ventricular wall thickness. Right ventricular systolic function is normal. Left Atrium: Left atrial size was normal  in size. Right Atrium: Right atrial size was normal in size. Pericardium: A small pericardial effusion is present. The pericardial effusion is circumferential. Mitral Valve: The mitral valve is normal in structure. No evidence of mitral valve regurgitation. No evidence of mitral valve stenosis. Tricuspid Valve: The tricuspid valve is normal in structure. Tricuspid valve regurgitation is trivial. No evidence of tricuspid stenosis. Aortic Valve: The aortic valve is normal in structure. Aortic valve regurgitation is not visualized. No aortic stenosis is present. Aortic valve mean gradient measures 4.0 mmHg. Aortic valve peak gradient measures 7.2 mmHg. Aortic valve area, by VTI measures 1.99 cm. Pulmonic Valve: The pulmonic valve was normal in structure. Pulmonic valve regurgitation is not visualized. No evidence of pulmonic stenosis. Aorta: The aortic root is normal in size and structure. Venous: The inferior vena cava is normal in size with greater than 50% respiratory variability, suggesting right atrial pressure of 3 mmHg. IAS/Shunts: No atrial level shunt detected by color flow Doppler.  LEFT VENTRICLE PLAX 2D LVIDd:         2.70 cm     Diastology LVIDs:  1.90 cm     LV e' medial:    3.81 cm/s LV PW:         1.50 cm     LV E/e' medial:  17.2 LV IVS:        1.50 cm     LV e' lateral:   5.33 cm/s LVOT diam:     1.90 cm     LV E/e' lateral: 12.3 LV SV:         46 LV SV Index:   27 LVOT Area:     2.84 cm  LV Volumes (MOD) LV vol d, MOD A2C: 29.8 ml LV vol d, MOD A4C: 47.3 ml LV vol s, MOD A2C: 14.0 ml LV vol s, MOD A4C: 20.1 ml LV SV MOD A2C:     15.8 ml LV SV MOD A4C:     47.3 ml LV SV MOD BP:      21.3 ml RIGHT VENTRICLE RV S prime:     15.00 cm/s TAPSE (M-mode): 1.6 cm LEFT ATRIUM             Index LA Vol (A2C):   30.9 ml 18.16 ml/m LA Vol (A4C):   24.7 ml 14.52 ml/m LA Biplane Vol: 28.6 ml 16.81 ml/m  AORTIC VALVE AV Area (Vmax):    1.95 cm AV Area (Vmean):   1.92 cm AV Area (VTI):     1.99 cm AV  Vmax:           134.00 cm/s AV Vmean:          88.200 cm/s AV VTI:            0.232 m AV Peak Grad:      7.2 mmHg AV Mean Grad:      4.0 mmHg LVOT Vmax:         92.20 cm/s LVOT Vmean:        59.800 cm/s LVOT VTI:          0.163 m LVOT/AV VTI ratio: 0.70 MITRAL VALVE MV Area (PHT): 4.31 cm     SHUNTS MV Decel Time: 176 msec     Systemic VTI:  0.16 m MV E velocity: 65.40 cm/s   Systemic Diam: 1.90 cm MV A velocity: 115.00 cm/s MV E/A ratio:  0.57 Jacqueline Magic MD Electronically signed by Jacqueline Magic MD Signature Date/Time: 08/10/2022/2:58:29 PM    Final    ECHOCARDIOGRAM LIMITED  Result Date: 08/10/2022    ECHOCARDIOGRAM LIMITED REPORT   Patient Name:   [name] Date of Exam: 08/10/2022 Medical Rec #:  [id number]      Height:       62.0 in Accession #:    [id number]     Weight:       152.0 lb Date of Birth:  1950/10/10       BSA:          1.701 m Patient Age:    71 years       BP:           149/72 mmHg Patient Gender: F              HR:           95 bpm. Exam Location:  Inpatient Procedure: Limited Echo Indications:    I31.3 Pericardial effusion (noninflammatory)  History:        Patient has prior history of Echocardiogram examinations, most  recent 08/13/2017. CHF, CAD, COPD; Risk Factors:Hypertension,                 Diabetes and Dyslipidemia.  Sonographer:    Irving Burton Senior RDCS Referring Phys: Orbie Pyo  Sonographer Comments: STAT limited at table with Dr. Lynnette Caffey IMPRESSIONS  1. Left ventricular ejection fraction, by estimation, is 60 to 65%. The left ventricle has normal function. The left ventricle has no regional wall motion abnormalities. Left ventricular diastolic function could not be evaluated.  2. Right ventricular systolic function is normal. The right ventricular size is normal.  3. The mitral valve is normal in structure.  4. The aortic valve was not assessed.  5. No pericardial effusion FINDINGS  Left Ventricle: Left ventricular ejection fraction, by estimation, is 60 to 65%.  The left ventricle has normal function. The left ventricle has no regional wall motion abnormalities. The left ventricular internal cavity size was normal in size. There is  no left ventricular hypertrophy. Left ventricular diastolic function could not be evaluated. Right Ventricle: The right ventricular size is normal. No increase in right ventricular wall thickness. Right ventricular systolic function is normal. Left Atrium: Left atrial size was normal in size. Right Atrium: Right atrial size was normal in size. Pericardium: There is no evidence of pericardial effusion. Mitral Valve: The mitral valve is normal in structure. Tricuspid Valve: The tricuspid valve is normal in structure. Tricuspid valve regurgitation is not demonstrated. No evidence of tricuspid stenosis. Aortic Valve: The aortic valve was not assessed. Pulmonic Valve: The pulmonic valve was not assessed. Aorta: The aortic root is normal in size and structure. Venous: The inferior vena cava was not well visualized. IAS/Shunts: No atrial level shunt detected by color flow Doppler. Jacqueline Magic MD Electronically signed by Jacqueline Magic MD Signature Date/Time: 08/10/2022/12:54:19 PM    Final    CARDIAC CATHETERIZATION  Result Date: 08/10/2022   Prox RCA to Dist RCA lesion is 10% stenosed.   Prox RCA lesion is 75% stenosed.   RPAV lesion is 99% stenosed.   A stent was successfully placed.   A stent was successfully placed.   A stent was successfully placed.   Post intervention, there is a 0% residual stenosis.   Post intervention, there is a 0% residual stenosis.   Post intervention, there is a 0% residual stenosis. 1.  High-grade proximal and ostial right coronary artery disease treated with shockwave lithotripsy and 1 drug-eluting stent. 2.  High-grade distal right coronary artery disease treated with 1 drug-eluting stent with plaque shift into the jailed PDA branch.  This was complicated by a small distal wire perforation.  The patient was not  hypotensive and a stat echocardiogram obtained in the cardiac catheterization laboratory demonstrated no significant pericardial effusion. 3.  Patent proximal and mid right coronary artery stents. 4.  Mild disease of the LAD. 5.  LVEDP of 14 mmHg. Recommendation: Dual antiplatelet therapy for at least 1 year.  Echocardiogram Deke Tilghman be done later today to assess for pericardial effusion.  The case was reviewed with Dr. Gery Pray the team C attending.    Cardiac Studies   2D echocardiogram (08/11/2022)  IMPRESSIONS     1. Windows are callenging. Left ventricular ejection fraction, by  estimation, is 60 to 65%. The left ventricle has normal function.   2. Right ventricular systolic function is normal. The right ventricular  size is normal.   3. A small pericardial effusion is present. There is no evidence of  cardiac tamponade.   Comparison(s):  No significant change from prior study.    Cardiac catheterization/PCI and stent (08/10/2022)   Conclusion      Prox RCA to Dist RCA lesion is 10% stenosed.   Prox RCA lesion is 75% stenosed.   RPAV lesion is 99% stenosed.   A stent was successfully placed.   A stent was successfully placed.   A stent was successfully placed.   Post intervention, there is a 0% residual stenosis.   Post intervention, there is a 0% residual stenosis.   Post intervention, there is a 0% residual stenosis.   1.  High-grade proximal and ostial right coronary artery disease treated with shockwave lithotripsy and 1 drug-eluting stent. 2.  High-grade distal right coronary artery disease treated with 1 drug-eluting stent with plaque shift into the jailed PDA branch.  This was complicated by a small distal wire perforation.  The patient was not hypotensive and a stat echocardiogram obtained in the cardiac catheterization laboratory demonstrated no significant pericardial effusion. 3.  Patent proximal and mid right coronary artery stents. 4.  Mild disease of the LAD. 5.  LVEDP  of 14 mmHg.   Recommendation: Dual antiplatelet therapy for at least 1 year.  Echocardiogram Kourosh Jablonsky be done later today to assess for pericardial effusion.  The case was reviewed with Dr. Gery Pray the team C attending.  Coronary Diagrams  Diagnostic Dominance: Right  Intervention     Patient Profile     [name] is a 72 y.o. female with past medical history of CAD status post DES x 2 to RCA '17, hypertension, hyperlipidemia, GERD, diabetes who is being seen 08/10/2022 for the evaluation of chest pain/inferior STEMI.   Assessment & Plan    1.  Inferior STEMI: Stenting of the RCA with shockwave angioplasty followed by stenting of the distal RECA lesion complicated by distal perforation of a posterolateral branch.  Echo with a small pericardial effusion but no evidence of tamponade.  Continue dual antiplatelet therapy with aspirin and Brilinta.  2.  Hypertension: Currently well-controlled  3.  Hyperlipidemia: Continue atorvastatin 80 mg  4.  Diabetes: Continue sliding scale insulin.  Metformin today.  5.  Pleuritic chest pain: Appears to be due to pericarditis.  Possibly due to MI and wire perforation.  Currently on colchicine, morphine, nitroglycerin.  Cali Cuartas continue to monitor.  6.  Difficulty swallowing: Usbaldo Pannone be evaluated by speech therapy  7.  CKD stage IIIa: Creatinine at baseline.   For questions or updates, please contact [facility] Please consult [email or web address] for contact info under        Signed, Karely Hurtado Jorja Loa, MD  08/12/2022, 8:42 AM

## 2022-08-13 LAB — GLUCOSE, CAPILLARY
Glucose-Capillary: 117 mg/dL — ABNORMAL HIGH (ref 70–99)
Glucose-Capillary: 125 mg/dL — ABNORMAL HIGH (ref 70–99)
Glucose-Capillary: 149 mg/dL — ABNORMAL HIGH (ref 70–99)
Glucose-Capillary: 127 mg/dL — ABNORMAL HIGH (ref 70–99)

## 2022-08-13 MED ORDER — LATANOPROST 0.005 % OP SOLN
1.0000 [drp] | Freq: Every day | OPHTHALMIC | Status: DC
  Administered 2022-08-13: 1 [drp] via OPHTHALMIC
  Filled 2022-08-13: qty 2.5

## 2022-08-13 NOTE — Progress Notes (Signed)
   Ordered esophagram after discussion with SLP and GI. Study should be done tomorrow. I made patient NPO at midnight  Jonita Albee, PA-C 08/13/2022 4:21 PM

## 2022-08-13 NOTE — Progress Notes (Signed)
Rounding Note    Patient Name: [name] Date of Encounter: 08/13/2022  [facility] Cardiologist: Donato Schultz, MD   Subjective   Feels improved compared to yesterday.  No longer has pain on deep inspiration.  Does have some discomfort when lying flat.  Is continuing to have issues with swallowing.  Was evaluated by speech therapy who has adjusted her diet and has recommended GI evaluation.  Inpatient Medications    Scheduled Meds:  aspirin  81 mg Oral Daily   atorvastatin  80 mg Oral Daily   budesonide (PULMICORT) nebulizer solution  0.25 mg Nebulization BID   Chlorhexidine Gluconate Cloth  6 each Topical Daily   colchicine  0.6 mg Oral BID   empagliflozin  10 mg Oral Daily   enoxaparin (LOVENOX) injection  30 mg Subcutaneous Q24H   insulin aspart  0-15 Units Subcutaneous TID WC   ipratropium-albuterol  3 mL Nebulization BID   isosorbide mononitrate  30 mg Oral Daily   metFORMIN  500 mg Oral BID WC   metoprolol tartrate  12.5 mg Oral BID   pantoprazole  40 mg Oral Daily   predniSONE  40 mg Oral Daily   sodium chloride flush  3 mL Intravenous Q12H   ticagrelor  90 mg Oral BID   Continuous Infusions:  sodium chloride     nitroGLYCERIN 35 mcg/min (08/13/22 0800)   PRN Meds: sodium chloride, acetaminophen, albuterol, ALPRAZolam, morphine injection, nitroGLYCERIN, ondansetron (ZOFRAN) IV, mouth rinse, simethicone, sodium chloride flush   Vital Signs    Vitals:   08/13/22 0545 08/13/22 0600 08/13/22 0727 08/13/22 0800  BP: 118/67 120/72    Pulse: 81 79  78  Resp: (!) Temp:   98.4 F (36.9 C)   TempSrc:   Oral   SpO2: 97% 96%  96%  Weight:      Height:        Intake/Output Summary (Last 24 hours) at 08/13/2022 0838 Last data filed at 08/13/2022 0800 Gross per 24 hour  Intake 508.54 ml  Output --  Net 508.54 ml       08/13/2022    3:45 AM 08/12/2022    3:20 AM 08/10/2022    4:55 AM  Last 3 Weights  Weight (lbs) 150 lb 2.1 oz 148 lb  5.9 oz 152 lb  Weight (kg) 68.1 kg 67.3 kg 68.947 kg      Telemetry    Sinus rhythm-personally reviewed  ECG    None new  Physical Exam   GEN: Well nourished, well developed, in no acute distress  HEENT: normal  Neck: no JVD, carotid bruits, or masses Cardiac: RRR; no murmurs, rubs, or gallops,no edema  Respiratory:  clear to auscultation bilaterally, normal work of breathing GI: soft, nontender, nondistended, + BS MS: no deformity or atrophy  Skin: warm and dry Neuro:  Strength and sensation are intact Psych: euthymic mood, full affect   Labs    High Sensitivity Troponin:   Recent Labs  Lab 08/10/22 0507 08/10/22 0716  TROPONINIHS 223* 168*      Chemistry Recent Labs  Lab 08/10/22 0507 08/10/22 0517 08/11/22 0129  NA 137 139 136  K 4.0 3.8 5.4*  CL 104  --  107  CO2 22  --  23  GLUCOSE 121*  --  202*  BUN 23  --  28*  CREATININE 1.49*  --  1.47*  CALCIUM 9.5  --  8.9  GFRNONAA 37*  --  37*  ANIONGAP 11  --  6     Lipids  Recent Labs  Lab 08/12/22 0035  CHOL 135  TRIG 188*  HDL 50  LDLCALC 47  CHOLHDL 2.7     Hematology Recent Labs  Lab 08/10/22 0507 08/10/22 0517 08/11/22 0129  WBC 11.6*  --  12.6*  RBC 3.61*  --  2.89*  HGB 11.8* 11.2* 9.3*  HCT 36.5 33.0* 28.7*  MCV 101.1*  --  99.3  MCH 32.7  --  32.2  MCHC 32.3  --  32.4  RDW 12.6  --  12.9  PLT 271  --  238    Thyroid No results for input(s): "TSH", "FREET4" in the last 168 hours.  BNP Recent Labs  Lab 08/10/22 0507  BNP 74.4     DDimer No results for input(s): "DDIMER" in the last 168 hours.   Radiology    No results found.  Cardiac Studies   2D echocardiogram (08/11/2022)  IMPRESSIONS     1. Windows are callenging. Left ventricular ejection fraction, by  estimation, is 60 to 65%. The left ventricle has normal function.   2. Right ventricular systolic function is normal. The right ventricular  size is normal.   3. A small pericardial effusion is present.  There is no evidence of  cardiac tamponade.   Comparison(s): No significant change from prior study.    Cardiac catheterization/PCI and stent (08/10/2022)   Conclusion      Prox RCA to Dist RCA lesion is 10% stenosed.   Prox RCA lesion is 75% stenosed.   RPAV lesion is 99% stenosed.   A stent was successfully placed.   A stent was successfully placed.   A stent was successfully placed.   Post intervention, there is a 0% residual stenosis.   Post intervention, there is a 0% residual stenosis.   Post intervention, there is a 0% residual stenosis.   1.  High-grade proximal and ostial right coronary artery disease treated with shockwave lithotripsy and 1 drug-eluting stent. 2.  High-grade distal right coronary artery disease treated with 1 drug-eluting stent with plaque shift into the jailed PDA branch.  This was complicated by a small distal wire perforation.  The patient was not hypotensive and a stat echocardiogram obtained in the cardiac catheterization laboratory demonstrated no significant pericardial effusion. 3.  Patent proximal and mid right coronary artery stents. 4.  Mild disease of the LAD. 5.  LVEDP of 14 mmHg.   Recommendation: Dual antiplatelet therapy for at least 1 year.  Echocardiogram Nikolas Casher be done later today to assess for pericardial effusion.  The case was reviewed with Dr. Gery Pray the team C attending.  Coronary Diagrams  Diagnostic Dominance: Right  Intervention     Patient Profile     [name] is a 72 y.o. female with past medical history of CAD status post DES x 2 to RCA '17, hypertension, hyperlipidemia, GERD, diabetes who is being seen 08/10/2022 for the evaluation of chest pain/inferior STEMI.   Assessment & Plan    1.  Inferior STEMI: Had stenting of the RCA with shockwave angioplasty followed by stenting of the distal RCA lesion and perforation late posterolateral branch.  Echo with a small effusion.  Continue aspirin and Brilinta.  2.   Hypertension: Currently well-controlled   3.  Hyperlipidemia: Continue atorvastatin 80 mg   4.  Diabetes: Continue sliding scale insulin and metformin   5.  Pleuritic chest pain: Appears due to pericarditis.  Currently on  morphine, nitroglycerin, colchicine.  Pain improved today.  Nurse is weaning off of nitroglycerin.  6.  Dysphagia: Has been evaluated by speech therapy.  Diet has been adjusted.  Khalil Belote consult GI.  7.  CKD stage IIIa: Creatinine at baseline.   For questions or updates, please contact [facility] Please consult [email or web address] for contact info under        Signed, Shakeda Pearse Jorja Loa, MD  08/13/2022, 8:38 AM

## 2022-08-14 ENCOUNTER — Inpatient Hospital Stay (HOSPITAL_COMMUNITY): Payer: Medicare Other

## 2022-08-14 ENCOUNTER — Other Ambulatory Visit (HOSPITAL_COMMUNITY): Payer: Self-pay

## 2022-08-14 DIAGNOSIS — I319 Disease of pericardium, unspecified: Secondary | ICD-10-CM | POA: Insufficient documentation

## 2022-08-14 DIAGNOSIS — I3139 Other pericardial effusion (noninflammatory): Secondary | ICD-10-CM | POA: Insufficient documentation

## 2022-08-14 LAB — CBC
HCT: 32.4 % — ABNORMAL LOW (ref 36.0–46.0)
Hemoglobin: 10.6 g/dL — ABNORMAL LOW (ref 12.0–15.0)
MCH: 32.1 pg (ref 26.0–34.0)
MCHC: 32.7 g/dL (ref 30.0–36.0)
MCV: 98.2 fL (ref 80.0–100.0)
Platelets: 255 10*3/uL (ref 150–400)
RBC: 3.3 MIL/uL — ABNORMAL LOW (ref 3.87–5.11)
RDW: 12.6 % (ref 11.5–15.5)
WBC: 9.1 10*3/uL (ref 4.0–10.5)
nRBC: 0 % (ref 0.0–0.2)

## 2022-08-14 LAB — BASIC METABOLIC PANEL
Anion gap: 11 (ref 5–15)
BUN: 18 mg/dL (ref 8–23)
CO2: 23 mmol/L (ref 22–32)
Calcium: 9.1 mg/dL (ref 8.9–10.3)
Chloride: 102 mmol/L (ref 98–111)
Creatinine, Ser: 1.21 mg/dL — ABNORMAL HIGH (ref 0.44–1.00)
GFR, Estimated: 47 mL/min — ABNORMAL LOW (ref >60)
Glucose, Bld: 111 mg/dL — ABNORMAL HIGH (ref 70–99)
Potassium: 3.6 mmol/L (ref 3.5–5.1)
Sodium: 136 mmol/L (ref 135–145)

## 2022-08-14 LAB — GLUCOSE, CAPILLARY
Glucose-Capillary: 90 mg/dL (ref 70–99)
Glucose-Capillary: 163 mg/dL — ABNORMAL HIGH (ref 70–99)

## 2022-08-14 LAB — MAGNESIUM: Magnesium: 1.7 mg/dL (ref 1.7–2.4)

## 2022-08-14 MED ORDER — LOSARTAN POTASSIUM 25 MG PO TABS
25.0000 mg | ORAL_TABLET | Freq: Every day | ORAL | 0 refills | Status: DC
  Filled 2022-08-14: qty 90, 90d supply, fill #0

## 2022-08-14 MED ORDER — COLCHICINE 0.6 MG PO TABS
0.6000 mg | ORAL_TABLET | Freq: Every day | ORAL | 0 refills | Status: DC
  Filled 2022-08-14: qty 30, 30d supply, fill #0
  Filled 2022-09-08: qty 30, 30d supply, fill #1

## 2022-08-14 MED ORDER — ISOSORBIDE MONONITRATE ER 30 MG PO TB24
30.0000 mg | ORAL_TABLET | Freq: Every day | ORAL | 1 refills | Status: DC
  Filled 2022-08-14: qty 90, 90d supply, fill #0

## 2022-08-14 MED ORDER — CLOPIDOGREL BISULFATE 300 MG PO TABS
600.0000 mg | ORAL_TABLET | Freq: Once | ORAL | Status: AC
  Administered 2022-08-14: 600 mg via ORAL
  Filled 2022-08-14: qty 2

## 2022-08-14 MED ORDER — PREDNISONE 20 MG PO TABS
ORAL_TABLET | ORAL | 0 refills | Status: AC
  Filled 2022-08-14: qty 9, 6d supply, fill #0

## 2022-08-14 MED ORDER — CLOPIDOGREL BISULFATE 75 MG PO TABS: 75.0000 mg | ORAL_TABLET | Freq: Every day | ORAL | Status: DC

## 2022-08-14 MED ORDER — LOSARTAN POTASSIUM 25 MG PO TABS
25.0000 mg | ORAL_TABLET | Freq: Every day | ORAL | Status: DC
  Administered 2022-08-14: 25 mg via ORAL
  Filled 2022-08-14: qty 1

## 2022-08-14 MED ORDER — COLCHICINE 0.6 MG PO TABS: 0.6000 mg | ORAL_TABLET | Freq: Every day | ORAL | Status: DC

## 2022-08-14 MED ORDER — EMPAGLIFLOZIN 10 MG PO TABS
10.0000 mg | ORAL_TABLET | Freq: Every day | ORAL | 2 refills | Status: DC
  Filled 2022-08-14 – 2022-09-04 (×2): qty 30, 30d supply, fill #0

## 2022-08-14 MED ORDER — CLOPIDOGREL BISULFATE 75 MG PO TABS
75.0000 mg | ORAL_TABLET | Freq: Every day | ORAL | 2 refills | Status: DC
  Filled 2022-08-14: qty 90, 90d supply, fill #0

## 2022-08-14 MED ORDER — ATORVASTATIN CALCIUM 80 MG PO TABS
80.0000 mg | ORAL_TABLET | Freq: Every day | ORAL | 1 refills | Status: DC
  Filled 2022-08-14: qty 90, 90d supply, fill #0

## 2022-08-14 MED ORDER — ASPIRIN 81 MG PO CHEW
81.0000 mg | CHEWABLE_TABLET | Freq: Every day | ORAL | 2 refills | Status: AC
  Filled 2022-08-14: qty 90, 90d supply, fill #0

## 2022-08-14 NOTE — TOC Benefit Eligibility Note (Signed)
Patient Product/process development scientist completed.    The patient is currently admitted and upon discharge could be taking Farxiga 10 mg.  The current 30 day co-pay is $25.00.   The patient is currently admitted and upon discharge could be taking [name] 10 mg.  The current 30 day co-pay is $25.00.   The patient is insured through [employer]   This test claim was processed through [facility]- copay amounts may vary at other pharmacies due to pharmacy/plan contracts, or as the patient moves through the different stages of their insurance plan.  Roland Earl, CPHT Pharmacy Patient Advocate Specialist [facility] Patient Advocate Team Direct Number: [phone]  Fax: [phone]

## 2022-08-14 NOTE — Discharge Summary (Addendum)
Discharge Summary    Patient ID: [name] MRN: [id number]; DOB: 1951/02/08  Admit date: 08/10/2022 Discharge date: 08/14/2022  PCP:  Jacqueline Blamer, MD   [facility] Providers Cardiologist:  Jacqueline Schultz, MD     Discharge Diagnoses    Principal Problem:   STEMI (ST elevation myocardial infarction) Active Problems:   Type 2 diabetes mellitus with hyperglycemia   Essential hypertension   Diabetes mellitus type 2, controlled   Abnormal swallowing   Pericarditis   Pericardial effusion   Diagnostic Studies/Procedures    Cath: 08/10/2022    Prox RCA to Dist RCA lesion is 10% stenosed.   Prox RCA lesion is 75% stenosed.   RPAV lesion is 99% stenosed.   A stent was successfully placed.   A stent was successfully placed.   A stent was successfully placed.   Post intervention, there is a 0% residual stenosis.   Post intervention, there is a 0% residual stenosis.   Post intervention, there is a 0% residual stenosis.   1.  High-grade proximal and ostial right coronary artery disease treated with shockwave lithotripsy and 1 drug-eluting stent. 2.  High-grade distal right coronary artery disease treated with 1 drug-eluting stent with plaque shift into the jailed PDA branch.  This was complicated by a small distal wire perforation.  The patient was not hypotensive and a stat echocardiogram obtained in the cardiac catheterization laboratory demonstrated no significant pericardial effusion. 3.  Patent proximal and mid right coronary artery stents. 4.  Mild disease of the LAD. 5.  LVEDP of 14 mmHg.   Recommendation: Dual antiplatelet therapy for at least 1 year.  Echocardiogram will be done later today to assess for pericardial effusion.  The case was reviewed with Dr. Gery [name] the team C attending.   Diagnostic Dominance: Right  Intervention    Echo: 08/10/2022  IMPRESSIONS     1. Left ventricular ejection fraction, by estimation, is 55 to 60%. The  left  ventricle has normal function. The left ventricle has no regional  wall motion abnormalities. There is severe concentric left ventricular  hypertrophy. Left ventricular diastolic   parameters are consistent with Grade I diastolic dysfunction (impaired  relaxation). Elevated left ventricular end-diastolic pressure.   2. Right ventricular systolic function is normal. The right ventricular  size is normal.   3. The mitral valve is normal in structure. No evidence of mitral valve  regurgitation. No evidence of mitral stenosis.   4. The aortic valve is normal in structure. Aortic valve regurgitation is  not visualized. No aortic stenosis is present.   5. The inferior vena cava is normal in size with greater than 50%  respiratory variability, suggesting right atrial pressure of 3 mmHg.   6. A small pericardial effusion is present. The pericardial effusion is  circumferential. This is new compared to prior echo earier in the day.   FINDINGS   Left Ventricle: Left ventricular ejection fraction, by estimation, is 55  to 60%. The left ventricle has normal function. The left ventricle has no  regional wall motion abnormalities. The left ventricular internal cavity  size was normal in size. There is   severe concentric left ventricular hypertrophy. Left ventricular  diastolic parameters are consistent with Grade I diastolic dysfunction  (impaired relaxation). Elevated left ventricular end-diastolic pressure.   Right Ventricle: The right ventricular size is normal. No increase in  right ventricular wall thickness. Right ventricular systolic function is  normal.   Left Atrium: Left atrial size was  normal in size.   Right Atrium: Right atrial size was normal in size.   Pericardium: A small pericardial effusion is present. The pericardial  effusion is circumferential.   Mitral Valve: The mitral valve is normal in structure. No evidence of  mitral valve regurgitation. No evidence of mitral valve  stenosis.   Tricuspid Valve: The tricuspid valve is normal in structure. Tricuspid  valve regurgitation is trivial. No evidence of tricuspid stenosis.   Aortic Valve: The aortic valve is normal in structure. Aortic valve  regurgitation is not visualized. No aortic stenosis is present. Aortic  valve mean gradient measures 4.0 mmHg. Aortic valve peak gradient measures  7.2 mmHg. Aortic valve area, by VTI  measures 1.99 cm.   Pulmonic Valve: The pulmonic valve was normal in structure. Pulmonic valve  regurgitation is not visualized. No evidence of pulmonic stenosis.   Aorta: The aortic root is normal in size and structure.   Venous: The inferior vena cava is normal in size with greater than 50%  respiratory variability, suggesting right atrial pressure of 3 mmHg.   IAS/Shunts: No atrial level shunt detected by color flow Doppler.   Echo: 08/11/2022  IMPRESSIONS     1. Windows are callenging. Left ventricular ejection fraction, by  estimation, is 60 to 65%. The left ventricle has normal function.   2. Right ventricular systolic function is normal. The right ventricular  size is normal.   3. A small pericardial effusion is present. There is no evidence of  cardiac tamponade.   Comparison(s): No significant change from prior study.   FINDINGS   Left Ventricle: Windows are callenging. Left ventricular ejection  fraction, by estimation, is 60 to 65%. The left ventricle has normal  function. Definity contrast agent was given IV to delineate the left  ventricular endocardial borders.   Right Ventricle: The right ventricular size is normal. Right ventricular  systolic function is normal.   Pericardium: A small pericardial effusion is present. There is no evidence  of cardiac tamponade.   [name]  Electronically signed by [name]  Signature Date/Time: 08/11/2022/8:29:49 AM     _____________   History of Present Illness     [name] is a 72 y.o. female with past  medical history noted above.  She presented to the ED 12/2015 with complaints of shortness of breath and chest tightness.  Underwent cardiac catheterization with DES x 2 to RCA, 10% ostial/proximal circumflex and 10% mid LAD.  She was treated with DAPT with aspirin/Plavix.  Echocardiogram 07/2017 showed LVEF of 65 to 70%, grade 2 diastolic dysfunction, trivial MR.  She was seen in the office 12/2021 for preoperative risk evaluation pending knee surgery.  She complained of dyspnea on exertion.  She underwent a nuclear stress test which was negative.    Most recently seen in the office on 4/5 and reported being in her usual state of health.  Stated she has been under a lot of stress recently secondary to her husband's diagnosis of cancer.  She was continued on atorvastatin, diastolic, hydralazine and spironolactone.   In the to the ED on 4/11 with complaints of left-sided chest pain that started around 4 AM.  Also felt dyspneic and slightly diaphoretic with symptoms.  EMS was called.  She was given albuterol, DuoNeb, Solu-Medrol and IV magnesium while in route.    Labs on admission showed sodium 137, potassium 4, creatinine 1.4, BNP 74, high-sensitivity troponin 223>> 68, WBC 11.6, hemoglobin 11.8.  Chest x-ray with mild  atelectasis.  EKG at 4:59 AM, sinus rhythm, 96 bpm baseline artifact.  Repeat EKG at 8:12 AM sinus tachycardia, 112 bpm, acute ST elevation in inferior leads with reciprocal depression in lead I, aVL, V2.  Patient still with ongoing chest pain.  Code STEMI was activated.  At the time of assessment patient complains of significant centralized chest pain 12/10.  She had been placed on IV heparin, initially not given aspirin secondary to reported allergy, though she states this is related to her renal function.  Transferred up to the Cath Lab for emergent cardiac catheterization.    Hospital Course     Consultants: GI   Inferior STEMI -- Underwent cardiac catheterization noted above with  high-grade proximal seal/ostial RCA disease treated with shockwave lithotripsy and DES x 1 as well as DES x 1 to jailed PDA branch.  Procedure was complicated by small distal wire perforation.  Follow-up echocardiogram showed small pericardial effusion.  Placed on DAPT with aspirin/Brilinta initially, given insurance issues she was transition to Plavix with a load of 600 mg daily then 75 mg daily.  Seen by cardiac rehab. -- Continue aspirin, Plavix, atorvastatin 80 mg daily, Imdur 30 mg daily losartan 25 mg daily  Wire perforation c/b small Pericardial effusion -- Echo while on the Cath Lab table with no pericardial effusion the day of cath.  Repeat echocardiogram later that afternoon with small pericardial effusion which was circumferential. Follow up echo the following morning with no change.   Pleuritic chest pain Pericarditis -- Developed significant improvement and placed on colchicine and steroids -- continue colchicine 0.6 mg daily x 3 months, as well as prednisone taper  HTN -- Stable during admission -- Continue losartan 25 mg daily, initially started on beta-blocker, but this was discontinued per Dr. 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  CKD IIIa -- Cr stable from 1.2-1.4 during admission  Patient seen by Dr. Lynnette Caffey and deemed stable for discharge home.  Follow-up arranged in the office.  Medication sent to [facility] pharmacy.  Educated by [employer].D. prior to discharge.  Did the patient have an acute coronary syndrome (MI, NSTEMI, STEMI, etc) this  admission?:  Yes                               AHA/ACC Clinical Performance & Quality Measures: Aspirin prescribed? - Yes ADP Receptor Inhibitor (Plavix/Clopidogrel, Brilinta/Ticagrelor or Effient/Prasugrel) prescribed (includes medically managed patients)? - Yes Beta Blocker prescribed? - No - preserved LVEF High Intensity Statin (Lipitor 40-80mg  or Crestor 20-40mg ) prescribed? - Yes EF assessed during THIS hospitalization? - Yes For EF <40%, was ACEI/ARB prescribed? - Not Applicable (EF >/= 40%) For EF <40%, Aldosterone Antagonist (Spironolactone or Eplerenone) prescribed? - Not Applicable (EF >/= 40%) Cardiac Rehab Phase II ordered (including medically managed patients)? - Yes   The patient will be scheduled for a TOC follow up appointment in 10-14 days.  A message has been sent to the [facility] and Scheduling Pool at the office where the patient  should be seen for follow up.  _____________  Discharge Vitals Blood pressure 122/74, pulse 82, temperature 97.7 F (36.5 C), temperature source Oral, resp. rate 17, height  (1.575 m), weight 66.1 kg, SpO2 96 %.  Filed Weights   08/12/22 0320 08/13/22 0345 08/14/22 0336  Weight: 67.3 kg 68.1 kg 66.1 kg    Labs & Radiologic Studies    CBC Recent Labs    08/14/22 0739  WBC 9.1  HGB 10.6*  HCT 32.4*  MCV 98.2  PLT 255   Basic Metabolic Panel Recent Labs    16/10/96 0739  NA 136  K 3.6  CL 102  CO2 23  GLUCOSE 111*  BUN 18  CREATININE 1.21*  CALCIUM 9.1  MG 1.7   Liver Function Tests No results for input(s): "AST", "ALT", "ALKPHOS", "BILITOT", "PROT", "ALBUMIN" in the last 72 hours. No results for input(s): "LIPASE", "AMYLASE" in the last 72 hours. High Sensitivity Troponin:   Recent Labs  Lab 08/10/22 0507 08/10/22 0716  TROPONINIHS 223* 168*    BNP Invalid input(s): "POCBNP" D-Dimer No results for input(s): "DDIMER" in the last 72 hours. Hemoglobin A1C No results for input(s): "HGBA1C" in the last 72  hours. Fasting Lipid Panel Recent Labs    08/12/22 0035  CHOL 135  HDL 50  LDLCALC 47  TRIG 188*  CHOLHDL 2.7   Thyroid Function Tests No results for input(s): "TSH", "T4TOTAL", "T3FREE", "THYROIDAB" in the last 72 hours.  Invalid input(s): "FREET3" _____________  DG ESOPHAGUS W DOUBLE CM (HD)  Result Date: 08/14/2022 CLINICAL DATA:  Patient with history of GERD, previous esophageal dilation over 5 years ago who presented to the ED 08/09/21 with chest pain. She underwent cardiac catheterization with RCA stent placement 08/10/22. Post procedure she noted dysphagia mostly with solids, occasionally liquids and mid sternal sensation of food getting stuck. Request for esophagram for further evaluation. EXAM: ESOPHAGUS/BARIUM SWALLOW/TABLET STUDY TECHNIQUE: Combined double and single contrast examination was performed using effervescent crystals, high-density barium, and thin liquid barium. This exam was performed by Lynnette Caffey, PA-C, and was supervised and interpreted by Gaylyn Rong, MD. FLUOROSCOPY: Radiation Exposure Index (as provided by the fluoroscopic device): 20.4 mGy Kerma COMPARISON:  None Available. FINDINGS: Swallowing: Moderately delayed initiation of swallowing with early spill into the vallecula and Piriforms. No laryngeal penetration. Pharynx: Unremarkable. Esophagus: Normal appearance. Esophageal motility: Primary peristaltic waves were intact on 3/4 swallows. This is considered within normal limits. Hiatal Hernia: None. Gastroesophageal reflux: Mild gastroesophageal reflux to the level of the mid esophagus in the prone position. Ingested 13 mm barium tablet: Passed normally Other: None. IMPRESSION: 1. Single episode of gastroesophageal reflux to the level of the midesophagus. 2. Moderately delayed initiation of swallowing with early spill into the vallecular and piriform sinuses. No laryngeal penetration. Electronically Signed   By: Gaylyn Rong M.D.   On: 08/14/2022  13:54   ECHOCARDIOGRAM LIMITED  Result Date: 08/11/2022    ECHOCARDIOGRAM LIMITED REPORT   Patient Name:   ROXANA LAI Date of Exam: 08/11/2022 Medical Rec #:  [id number]      Height:       62.0 in Accession #:    [id number]     Weight:       152.0 lb Date of Birth:  Nov 04, 1950       BSA:          1.701 m Patient Age:    71 years       BP:  139/80 mmHg Patient Gender: F              HR:           89 bpm. Exam Location:  Inpatient Procedure: 2D Echo and Intracardiac Opacification Agent Indications:    Pericardial Effusion  History:        Patient has prior history of Echocardiogram examinations, most                 recent 08/10/2022. CHF, CAD and Acute MI; COPD. Pericardial                 effusion from wire perf on 08/10/22.  Sonographer:    Milbert Coulter Referring Phys: Laverda Page, B IMPRESSIONS  1. Windows are callenging. Left ventricular ejection fraction, by estimation, is 60 to 65%. The left ventricle has normal function.  2. Right ventricular systolic function is normal. The right ventricular size is normal.  3. A small pericardial effusion is present. There is no evidence of cardiac tamponade. Comparison(s): No significant change from prior study. FINDINGS  Left Ventricle: Windows are callenging. Left ventricular ejection fraction, by estimation, is 60 to 65%. The left ventricle has normal function. Definity contrast agent was given IV to delineate the left ventricular endocardial borders. Right Ventricle: The right ventricular size is normal. Right ventricular systolic function is normal. Pericardium: A small pericardial effusion is present. There is no evidence of cardiac tamponade. [name] Electronically signed by [name] Signature Date/Time: 08/11/2022/8:29:49 AM    Final    ECHOCARDIOGRAM COMPLETE  Result Date: 08/10/2022    ECHOCARDIOGRAM REPORT   Patient Name:   RUBYANN LINGLE Date of Exam: 08/10/2022 Medical Rec #:  [id number]      Height:       62.0 in Accession #:     [id number]     Weight:       152.0 lb Date of Birth:  15-Nov-1950       BSA:          1.701 m Patient Age:    71 years       BP:           107/74 mmHg Patient Gender: F              HR:           95 bpm. Exam Location:  Inpatient Procedure: 2D Echo, Cardiac Doppler and Color Doppler Indications:    Chest pain  History:        Patient has prior history of Echocardiogram examinations, most                 recent 08/10/2022. CHF, CAD and Acute MI; COPD.  Sonographer:    Milbert Coulter Referring Phys: [id number] SHENG L HALEY  Sonographer Comments: Image acquisition challenging due to respiratory motion and patient sensitive to probe contact. IMPRESSIONS  1. Left ventricular ejection fraction, by estimation, is 55 to 60%. The left ventricle has normal function. The left ventricle has no regional wall motion abnormalities. There is severe concentric left ventricular hypertrophy. Left ventricular diastolic  parameters are consistent with Grade I diastolic dysfunction (impaired relaxation). Elevated left ventricular end-diastolic pressure.  2. Right ventricular systolic function is normal. The right ventricular size is normal.  3. The mitral valve is normal in structure. No evidence of mitral valve regurgitation. No evidence of mitral stenosis.  4. The aortic valve is normal in structure. Aortic valve regurgitation is not visualized. No aortic stenosis is  present.  5. The inferior vena cava is normal in size with greater than 50% respiratory variability, suggesting right atrial pressure of 3 mmHg.  6. A small pericardial effusion is present. The pericardial effusion is circumferential. This is new compared to prior echo earier in the day. FINDINGS  Left Ventricle: Left ventricular ejection fraction, by estimation, is 55 to 60%. The left ventricle has normal function. The left ventricle has no regional wall motion abnormalities. The left ventricular internal cavity size was normal in size. There is  severe concentric left  ventricular hypertrophy. Left ventricular diastolic parameters are consistent with Grade I diastolic dysfunction (impaired relaxation). Elevated left ventricular end-diastolic pressure. Right Ventricle: The right ventricular size is normal. No increase in right ventricular wall thickness. Right ventricular systolic function is normal. Left Atrium: Left atrial size was normal in size. Right Atrium: Right atrial size was normal in size. Pericardium: A small pericardial effusion is present. The pericardial effusion is circumferential. Mitral Valve: The mitral valve is normal in structure. No evidence of mitral valve regurgitation. No evidence of mitral valve stenosis. Tricuspid Valve: The tricuspid valve is normal in structure. Tricuspid valve regurgitation is trivial. No evidence of tricuspid stenosis. Aortic Valve: The aortic valve is normal in structure. Aortic valve regurgitation is not visualized. No aortic stenosis is present. Aortic valve mean gradient measures 4.0 mmHg. Aortic valve peak gradient measures 7.2 mmHg. Aortic valve area, by VTI measures 1.99 cm. Pulmonic Valve: The pulmonic valve was normal in structure. Pulmonic valve regurgitation is not visualized. No evidence of pulmonic stenosis. Aorta: The aortic root is normal in size and structure. Venous: The inferior vena cava is normal in size with greater than 50% respiratory variability, suggesting right atrial pressure of 3 mmHg. IAS/Shunts: No atrial level shunt detected by color flow Doppler.  LEFT VENTRICLE PLAX 2D LVIDd:         2.70 cm     Diastology LVIDs:         1.90 cm     LV e' medial:    3.81 cm/s LV PW:         1.50 cm     LV E/e' medial:  17.2 LV IVS:        1.50 cm     LV e' lateral:   5.33 cm/s LVOT diam:     1.90 cm     LV E/e' lateral: 12.3 LV SV:         46 LV SV Index:   27 LVOT Area:     2.84 cm  LV Volumes (MOD) LV vol d, MOD A2C: 29.8 ml LV vol d, MOD A4C: 47.3 ml LV vol s, MOD A2C: 14.0 ml LV vol s, MOD A4C: 20.1 ml LV SV MOD  A2C:     15.8 ml LV SV MOD A4C:     47.3 ml LV SV MOD BP:      21.3 ml RIGHT VENTRICLE RV S prime:     15.00 cm/s TAPSE (M-mode): 1.6 cm LEFT ATRIUM             Index LA Vol (A2C):   30.9 ml 18.16 ml/m LA Vol (A4C):   24.7 ml 14.52 ml/m LA Biplane Vol: 28.6 ml 16.81 ml/m  AORTIC VALVE AV Area (Vmax):    1.95 cm AV Area (Vmean):   1.92 cm AV Area (VTI):     1.99 cm AV Vmax:           134.00 cm/s AV Vmean:  88.200 cm/s AV VTI:            0.232 m AV Peak Grad:      7.2 mmHg AV Mean Grad:      4.0 mmHg LVOT Vmax:         92.20 cm/s LVOT Vmean:        59.800 cm/s LVOT VTI:          0.163 m LVOT/AV VTI ratio: 0.70 MITRAL VALVE MV Area (PHT): 4.31 cm     SHUNTS MV Decel Time: 176 msec     Systemic VTI:  0.16 m MV E velocity: 65.40 cm/s   Systemic Diam: 1.90 cm MV A velocity: 115.00 cm/s MV E/A ratio:  0.57 Armanda Magic MD Electronically signed by Armanda Magic MD Signature Date/Time: 08/10/2022/2:58:29 PM    Final    ECHOCARDIOGRAM LIMITED  Result Date: 08/10/2022    ECHOCARDIOGRAM LIMITED REPORT   Patient Name:   MARIEME MCMACKIN Date of Exam: 08/10/2022 Medical Rec #:  [id number]      Height:       62.0 in Accession #:    [id number]     Weight:       152.0 lb Date of Birth:  1951/01/24       BSA:          1.701 m Patient Age:    71 years       BP:           149/72 mmHg Patient Gender: F              HR:           95 bpm. Exam Location:  Inpatient Procedure: Limited Echo Indications:    I31.3 Pericardial effusion (noninflammatory)  History:        Patient has prior history of Echocardiogram examinations, most                 recent 08/13/2017. CHF, CAD, COPD; Risk Factors:Hypertension,                 Diabetes and Dyslipidemia.  Sonographer:    Irving Burton Senior RDCS Referring Phys: Orbie Pyo  Sonographer Comments: STAT limited at table with Dr. Lynnette Caffey IMPRESSIONS  1. Left ventricular ejection fraction, by estimation, is 60 to 65%. The left ventricle has normal function. The left ventricle has no regional  wall motion abnormalities. Left ventricular diastolic function could not be evaluated.  2. Right ventricular systolic function is normal. The right ventricular size is normal.  3. The mitral valve is normal in structure.  4. The aortic valve was not assessed.  5. No pericardial effusion FINDINGS  Left Ventricle: Left ventricular ejection fraction, by estimation, is 60 to 65%. The left ventricle has normal function. The left ventricle has no regional wall motion abnormalities. The left ventricular internal cavity size was normal in size. There is  no left ventricular hypertrophy. Left ventricular diastolic function could not be evaluated. Right Ventricle: The right ventricular size is normal. No increase in right ventricular wall thickness. Right ventricular systolic function is normal. Left Atrium: Left atrial size was normal in size. Right Atrium: Right atrial size was normal in size. Pericardium: There is no evidence of pericardial effusion. Mitral Valve: The mitral valve is normal in structure. Tricuspid Valve: The tricuspid valve is normal in structure. Tricuspid valve regurgitation is not demonstrated. No evidence of tricuspid stenosis. Aortic Valve: The aortic valve was not assessed. Pulmonic Valve: The pulmonic valve was not  assessed. Aorta: The aortic root is normal in size and structure. Venous: The inferior vena cava was not well visualized. IAS/Shunts: No atrial level shunt detected by color flow Doppler. Armanda Magic MD Electronically signed by Armanda Magic MD Signature Date/Time: 08/10/2022/12:54:19 PM    Final    CARDIAC CATHETERIZATION  Result Date: 08/10/2022   Prox RCA to Dist RCA lesion is 10% stenosed.   Prox RCA lesion is 75% stenosed.   RPAV lesion is 99% stenosed.   A stent was successfully placed.   A stent was successfully placed.   A stent was successfully placed.   Post intervention, there is a 0% residual stenosis.   Post intervention, there is a 0% residual stenosis.   Post  intervention, there is a 0% residual stenosis. 1.  High-grade proximal and ostial right coronary artery disease treated with shockwave lithotripsy and 1 drug-eluting stent. 2.  High-grade distal right coronary artery disease treated with 1 drug-eluting stent with plaque shift into the jailed PDA branch.  This was complicated by a small distal wire perforation.  The patient was not hypotensive and a stat echocardiogram obtained in the cardiac catheterization laboratory demonstrated no significant pericardial effusion. 3.  Patent proximal and mid right coronary artery stents. 4.  Mild disease of the LAD. 5.  LVEDP of 14 mmHg. Recommendation: Dual antiplatelet therapy for at least 1 year.  Echocardiogram will be done later today to assess for pericardial effusion.  The case was reviewed with Dr. Gery [name] the team C attending.   CT Angio Chest PE W and/or Wo Contrast  Result Date: 08/10/2022 CLINICAL DATA:  Dyspnea, chronic, chest wall or pleura disease suspected. EXAM: CT ANGIOGRAPHY CHEST WITH CONTRAST TECHNIQUE: Multidetector CT imaging of the chest was performed using the standard protocol during bolus administration of intravenous contrast. Multiplanar CT image reconstructions and MIPs were obtained to evaluate the vascular anatomy. RADIATION DOSE REDUCTION: This exam was performed according to the departmental dose-optimization program which includes automated exposure control, adjustment of the mA and/or kV according to patient size and/or use of iterative reconstruction technique. CONTRAST:  50mL OMNIPAQUE IOHEXOL 350 MG/ML SOLN COMPARISON:  Chest radiograph 08/10/2022 and chest CTA 03/31/2022 and MR abdomen 04/20/2022 FINDINGS: Cardiovascular: Satisfactory opacification of the pulmonary arteries to the segmental level. No evidence of pulmonary embolism. Normal heart size. No pericardial effusion. Normal caliber of the thoracic aorta. Diffuse coronary artery calcifications. Great vessels are patent. Celiac  trunk and proximal SMA are patent in the upper abdomen. Mediastinum/Nodes: No mediastinal, hilar or axillary lymphadenopathy. Esophagus is unremarkable. Thyroid tissue is unremarkable. Lungs/Pleura: Trachea and mainstem bronchi are patent. Patchy densities in lower lobes, right side greater than left. Favor atelectasis in this area. No large areas of consolidation. No pleural effusions. Subpleural nodule in the anterior right upper lobe measures 3 mm on image 55/6 and this is stable since 08/03/2016 and compatible with a benign nodule. Upper Abdomen: Again noted is an exophytic low-density structure along the anterior left kidney upper pole that was previously described as a cyst on the abdominal MRI. No acute abnormality. Musculoskeletal: No acute bone abnormality. Review of the MIP images confirms the above findings. IMPRESSION: 1. Negative for a pulmonary embolism. 2. Patchy densities in the lower lobes, right side greater than left. Findings are most compatible with atelectasis. No large areas of consolidation or pleural effusions. 3. Coronary artery calcifications. 4. Stable 3 mm subpleural nodule in the right upper lobe. This nodule is stable since 08/03/2016 and compatible with a benign  nodule. Electronically Signed   By: Richarda Overlie M.D.   On: 08/10/2022 07:14   DG Chest Portable 1 View  Result Date: 08/10/2022 CLINICAL DATA:  Shortness of breath EXAM: PORTABLE CHEST 1 VIEW COMPARISON:  03/31/2022 FINDINGS: Artifact from EKG leads. Normal heart size and mediastinal contours. Minimal streaky density at the left diaphragm. No acute infiltrate or edema. No effusion or pneumothorax. No acute osseous findings. IMPRESSION: Minimal atelectasis on the left.  Otherwise stable from 2023. Electronically Signed   By: Tiburcio Pea M.D.   On: 08/10/2022 05:36   Disposition   Pt is being discharged home today in good condition.  Follow-up Plans & Appointments     Follow-up Information     Louanne Skye Devoria Albe., NP Follow up on 08/22/2022.   Specialty: Cardiology Why: at 1:55pm for your follow up appt with Dr. Anne Fu' NP Lawerance Sabal information: [address] [place] [address] [phone]                Discharge Instructions     Amb Referral to Cardiac Rehabilitation   Complete by: As directed    Diagnosis:  STEMI Coronary Stents     After initial evaluation and assessments completed: Virtual Based Care may be provided alone or in conjunction with Phase 2 Cardiac Rehab based on patient barriers.: Yes   Intensive Cardiac Rehabilitation (ICR) [facility] location only OR Traditional Cardiac Rehabilitation (TCR) *If criteria for ICR are not met will enroll in TCR [facility] only): Yes   Call MD for:  difficulty breathing, headache or visual disturbances   Complete by: As directed    Call MD for:  persistant dizziness or light-headedness   Complete by: As directed    Call MD for:  redness, tenderness, or signs of infection (pain, swelling, redness, odor or green/yellow discharge around incision site)   Complete by: As directed    Diet - low sodium heart healthy   Complete by: As directed    Discharge instructions   Complete by: As directed    Radial Site Care Refer to this sheet in the next few weeks. These instructions provide you with information on caring for yourself after your procedure. Your caregiver may also give you more specific instructions. Your treatment has been planned according to current medical practices, but problems sometimes occur. Call your caregiver if you have any problems or questions after your procedure. HOME CARE INSTRUCTIONS You may shower the day after the procedure. Remove the bandage (dressing) and gently wash the site with plain soap and water. Gently pat the site dry.  Do not apply powder or lotion to the site.  Do not submerge the affected site in water for 3 to 5 days.  Inspect the site at least twice daily.  Do not flex or bend  the affected arm for 24 hours.  No lifting over 5 pounds (2.3 kg) for 5 days after your procedure.  Do not drive home if you are discharged the same day of the procedure. Have someone else drive you.  You may drive 24 hours after the procedure unless otherwise instructed by your caregiver.  What to expect: Any bruising will usually fade within 1 to 2 weeks.  Blood that collects in the tissue (hematoma) may be painful to the touch. It should usually decrease in size and tenderness within 1 to 2 weeks.  SEEK IMMEDIATE MEDICAL CARE IF: You have unusual pain at the radial site.  You have redness, warmth,  swelling, or pain at the radial site.  You have drainage (other than a small amount of blood on the dressing).  You have chills.  You have a fever or persistent symptoms for more than 72 hours.  You have a fever and your symptoms suddenly get worse.  Your arm becomes pale, cool, tingly, or numb.  You have heavy bleeding from the site. Hold pressure on the site.   PLEASE DO NOT MISS ANY DOSES OF YOUR PLAVIX!!!!! Also keep a log of you blood pressures and bring back to your follow up appt. Please call the office with any questions.   Patients taking blood thinners should generally stay away from medicines like ibuprofen, Advil, Motrin, naproxen, and Aleve due to risk of stomach bleeding. You may take Tylenol as directed or talk to your primary doctor about alternatives.   PLEASE ENSURE THAT YOU DO NOT RUN OUT OF YOUR PLAVIX. This medication is very important to remain on for at least one year. IF you have issues obtaining this medication due to cost please CALL the office 3-5 business days prior to running out in order to prevent missing doses of this medication.   Increase activity slowly   Complete by: As directed         Discharge Medications   Allergies as of 08/14/2022       Reactions   Tramadol Other (See Comments)   Not to be taken because of existing seizure disorder/SEIZURES    Chlorthalidone Other (See Comments)   Was stopped due to kidney function        Medication List     STOP taking these medications    diltiazem 360 MG 24 hr capsule Commonly known as: CARDIZEM CD   hydrALAZINE 100 MG tablet Commonly known as: APRESOLINE   nebivolol 10 MG tablet Commonly known as: BYSTOLIC   spironolactone 25 MG tablet Commonly known as: ALDACTONE       TAKE these medications    albuterol 108 (90 Base) MCG/ACT inhaler Commonly known as: ProAir HFA Inhale 2 puffs into the lungs every 4 (four) hours as needed for wheezing or shortness of breath.   albuterol (2.5 MG/3ML) 0.083% nebulizer solution Commonly known as: PROVENTIL Take 3 mLs (2.5 mg total) by nebulization every 4 (four) hours as needed for wheezing or shortness of breath.   ALPRAZolam 0.5 MG tablet Commonly known as: XANAX Take 1 tablet (0.5 mg total) by mouth 2 (two) times daily as needed for anxiety. What changed:  when to take this additional instructions   aspirin 81 MG chewable tablet Chew 1 tablet (81 mg total) by mouth daily. Start taking on: August 15, 2022   atorvastatin 80 MG tablet Commonly known as: LIPITOR Take 1 tablet (80 mg total) by mouth daily. Start taking on: August 15, 2022 What changed:  medication strength how much to take when to take this   Biotin 5000 MCG Caps Take 5,000 mcg by mouth daily.   budesonide 0.5 MG/2ML nebulizer solution Commonly known as: PULMICORT USE 1 VIAL IN NEBULIZER IN THE MORNING AND AT BEDTIME What changed: See the new instructions.   clopidogrel 75 MG tablet Commonly known as: PLAVIX Take 1 tablet (75 mg total) by mouth daily. Start taking on: August 15, 2022   colchicine 0.6 MG tablet Take 1 tablet (0.6 mg total) by mouth daily. Start taking on: August 15, 2022   divalproex 500 MG 24 hr tablet Commonly known as: DEPAKOTE ER Take 1 tablet (500 mg total)  by mouth at bedtime.   empagliflozin 10 MG Tabs tablet Commonly known  as: [name] Take 1 tablet (10 mg total) by mouth daily. Start taking on: August 15, 2022   EQ Probiotic Caps Take 1 capsule by mouth daily.   ferrous sulfate 325 (65 FE) MG tablet Take 325 mg by mouth daily with breakfast.   formoterol 20 MCG/2ML nebulizer solution Commonly known as: PERFOROMIST USE 1 VIAL VIA NEBULIZER TWICE  DAILY IN THE MORNING AND EVENING What changed:  how much to take how to take this when to take this additional instructions   isosorbide mononitrate 30 MG 24 hr tablet Commonly known as: IMDUR Take 1 tablet (30 mg total) by mouth daily. Start taking on: August 15, 2022   latanoprost 0.005 % ophthalmic solution Commonly known as: XALATAN Place 1 drop into both eyes at bedtime.   losartan 25 MG tablet Commonly known as: COZAAR Take 1 tablet (25 mg total) by mouth daily. Start taking on: August 15, 2022   magnesium oxide 400 (240 Mg) MG tablet Commonly known as: MAG-OX Take 1 tablet (400 mg total) by mouth daily.   memantine 10 MG tablet Commonly known as: NAMENDA Take 1 tablet (10 mg total) by mouth 2 (two) times daily.   metFORMIN 1000 MG tablet Commonly known as: GLUCOPHAGE Take 1,000 mg by mouth 2 (two) times daily with a meal.   montelukast 10 MG tablet Commonly known as: SINGULAIR TAKE 1 TABLET BY MOUTH AT  BEDTIME   nitroGLYCERIN 0.4 MG SL tablet Commonly known as: NITROSTAT Place 1 tablet (0.4 mg total) under the tongue every 5 (five) minutes as needed for chest pain. Don't exceed more than 3 doses in 15 mins What changed: additional instructions   ONE-A-DAY WOMENS PO Take 1 tablet by mouth daily with breakfast.   pantoprazole 40 MG tablet Commonly known as: PROTONIX Take 1 tablet (40 mg total) by mouth 2 (two) times daily before a meal.   predniSONE 20 MG tablet Commonly known as: DELTASONE Take 2 tablets (40 mg total) by mouth daily for 3 days, THEN 1.5 tablets (30 mg total) daily for 1 day, THEN 1 tablet (20 mg total) daily  for 1 day, THEN 0.5 tablets (10 mg total) daily for 1 day. Start taking on: August 15, 2022         Outstanding Labs/Studies   FLP/LFTs in 8 weeks BMET at follow up   Duration of Discharge Encounter   Greater than 30 minutes including physician time.  Signed, Laverda Page, NP 08/14/2022, 2:18 PM   ATTENDING ATTESTATION:  After conducting a review of all available clinical information with the care team, interviewing the patient, and performing a physical exam, I agree with the findings and plan described in this note.   GEN: No acute distress.   HEENT:  MMM, no JVD, no scleral icterus Cardiac: RRR, no murmurs, rubs, or gallops.  Respiratory: Clear to auscultation bilaterally. GI: Soft, nontender, non-distended  MS: No edema; No deformity. Neuro:  Nonfocal  Vasc:  +2 radial pulses  No acute events overnight. Pleuritic chest pain is somewhat improved. Still with odynophagia and has esophagram scheduled>>dysmotility only.  Ambulating in hallways without difficulty.  Will discharge home today with plan as outlined below.  1.  Inferior STEMI: Doing well.  Cannot afford Ticagrelor so will load with plavix 600mg  and start plavix 75mg  qday.  EF normal so will d/c metoprolol given REDUCE-AMI trial results.   2.  Hypertension: Start losartan 25mg  qday.  Check BMP next week.    3.  Hyperlipidemia: Continue atorvastatin 80 mg    4.  Diabetes: Continue sliding scale insulin and metformin; cont [name], ASA, atorvastatin, and start losartan 25mg .   5.  Pleuritic chest pain: Due to small pericardial effusion.  Cont colchicine 0.6 qday (renally dosed) x 3 months and 2 week taper of steroids   6.  Dysphagia/odynophagia: Esophogram with dysmotility only; appreciate GI recs; will arrange for GI follow up.   7.  CKD stage IIIa: Monitor for now.   8.  Wire perforation:  Small distal wire perforation with small effusion that is not hemodynamically an issue.     9.  Disposition:  D/C  today    Alverda Skeans, MD Pager [phone]

## 2022-08-14 NOTE — Consult Note (Signed)
Referring Provider: Cardiology Primary Care Physician:  [name], MD Primary Gastroenterologist:  Dr. Matthias Hughs  Reason for Consultation: Chronic dysphagia  HPI: [name] is a 72 y.o. female with past medical history of longstanding reflux, history of esophageal stricture requiring dilation in the past currently admitted to the hospital with chest pain and inferior STEMI.  Patient underwent PCI on April 12 was found to have complex lesion requiring shockwave angioplasty which was complicated by small distal perforation.  Currently patient is on aspirin and Brilinta.  Was seen by speech therapy who has recommended GI evaluation for chronic dysphagia.    Patient had barium swallow done in March 2022 for evaluation of chronic dysphagia which was normal.  13 mm pill passed into the stomach without any delay.  Patient seen and examined at bedside.  She just came back from barium swallow.  According to her, her swallow function is same for last few years.  She has trouble swallowing certain foods periodically.  Denies any nausea vomiting.  Past Medical History:  Diagnosis Date   Anxiety    Arthritis    Asthma    Chronic diastolic CHF (congestive heart failure) 07/17/2016   Coronary artery disease    a. NSTEMI 12/2015 - LHC 01/18/16: s/p overlapping DESx2 to RCA, 10% ost-prox Cx,10% mLAD.   Dementia    Depression    Diabetes mellitus    Dyslipidemia    GERD (gastroesophageal reflux disease)    History of seizure    Hypertension    Hypertensive heart disease    NSTEMI (non-ST elevated myocardial infarction) 12/2015   Stage 3 chronic kidney disease     Past Surgical History:  Procedure Laterality Date   ABDOMINAL HYSTERECTOMY     CARDIAC CATHETERIZATION  1994   minimal LAD dz, no other dz, EF normal   CARDIAC CATHETERIZATION N/A 01/18/2016   Procedure: Left Heart Cath and Coronary Angiography;  Surgeon: Kathleene Hazel, MD;  Location: [facility] INVASIVE CV LAB;  Service:  Cardiovascular;  Laterality: N/A;   CARDIAC CATHETERIZATION N/A 01/18/2016   Procedure: Coronary Stent Intervention;  Surgeon: Kathleene Hazel, MD;  Location: [facility] INVASIVE CV LAB;  Service: Cardiovascular;  Laterality: N/A;   COLONOSCOPY WITH PROPOFOL N/A 04/09/2014   Procedure: COLONOSCOPY WITH PROPOFOL;  Surgeon: Florencia Reasons, MD;  Location: [facility] ENDOSCOPY;  Service: Endoscopy;  Laterality: N/A;   CORONARY LITHOTRIPSY N/A 08/10/2022   Procedure: CORONARY LITHOTRIPSY;  Surgeon: Orbie Pyo, MD;  Location: [facility] INVASIVE CV LAB;  Service: Cardiovascular;  Laterality: N/A;   CORONARY STENT INTERVENTION N/A 08/10/2022   Procedure: CORONARY STENT INTERVENTION;  Surgeon: Orbie Pyo, MD;  Location: [facility] INVASIVE CV LAB;  Service: Cardiovascular;  Laterality: N/A;   CORONARY STENT PLACEMENT  01/19/2016   STENT SYNERGY DES [id number] drug eluting stent was successfully placed, and overlaps the 2.5 x 38 mm Synergy stent placed distally.   CORONARY/GRAFT ACUTE MI REVASCULARIZATION N/A 08/10/2022   Procedure: Coronary/Graft Acute MI Revascularization;  Surgeon: Orbie Pyo, MD;  Location: [facility] INVASIVE CV LAB;  Service: Cardiovascular;  Laterality: N/A;   ESOPHAGOGASTRODUODENOSCOPY (EGD) WITH PROPOFOL N/A 04/09/2014   Procedure: ESOPHAGOGASTRODUODENOSCOPY (EGD) WITH PROPOFOL;  Surgeon: Florencia Reasons, MD;  Location: [facility] ENDOSCOPY;  Service: Endoscopy;  Laterality: N/A;   FOOT SURGERY Bilateral    approx ~2000   HEMORRHOID SURGERY     HERNIA REPAIR     KNEE ARTHROSCOPY     LEFT HEART CATH AND CORONARY ANGIOGRAPHY N/A 08/10/2022  Procedure: LEFT HEART CATH AND CORONARY ANGIOGRAPHY;  Surgeon: Orbie Pyo, MD;  Location: [facility] INVASIVE CV LAB;  Service: Cardiovascular;  Laterality: N/A;   RADIOACTIVE SEED GUIDED EXCISIONAL BREAST BIOPSY Left 01/20/2021   Procedure: RADIOACTIVE SEED GUIDED EXCISIONAL LEFT BREAST BIOPSY;  Surgeon: Almond Lint, MD;  Location: [facility] OR;  Service: General;   Laterality: Left;    Prior to Admission medications   Medication Sig Start Date End Date Taking? Authorizing Provider  albuterol (PROAIR HFA) 108 (90 Base) MCG/ACT inhaler Inhale 2 puffs into the lungs every 4 (four) hours as needed for wheezing or shortness of breath. 04/20/21  Yes Parrett, [name] S, NP  albuterol (PROVENTIL) (2.5 MG/3ML) 0.083% nebulizer solution Take 3 mLs (2.5 mg total) by nebulization every 4 (four) hours as needed for wheezing or shortness of breath. 05/17/21  Yes Nyoka Cowden, MD  ALPRAZolam Prudy Feeler) 0.5 MG tablet Take 1 tablet (0.5 mg total) by mouth 2 (two) times daily as needed for anxiety. Patient taking differently: Take 0.5 mg by mouth See admin instructions. Take 0.5 mg by mouth at bedtime and an additional 0.5 mg once a day as needed for anxiety 07/31/15  Yes Dorothea Ogle, MD  atorvastatin (LIPITOR) 40 MG tablet Take 40 mg by mouth at bedtime.   Yes [provider]  Biotin 5000 MCG CAPS Take 5,000 mcg by mouth daily.   Yes [provider]  budesonide (PULMICORT) 0.5 MG/2ML nebulizer solution USE 1 VIAL IN NEBULIZER IN THE MORNING AND AT BEDTIME Patient taking differently: Take 0.5 mg by nebulization daily. 03/29/21  Yes Nyoka Cowden, MD  diltiazem (CARDIZEM CD) 360 MG 24 hr capsule Take 360 mg by mouth daily.   Yes [provider]  divalproex (DEPAKOTE ER) 500 MG 24 hr tablet Take 1 tablet (500 mg total) by mouth at bedtime. 08/08/22  Yes Glean Salvo, NP  ferrous sulfate 325 (65 FE) MG tablet Take 325 mg by mouth daily with breakfast. 07/15/21  Yes [provider]  formoterol (PERFOROMIST) 20 MCG/2ML nebulizer solution USE 1 VIAL VIA NEBULIZER TWICE  DAILY IN THE MORNING AND EVENING Patient taking differently: Take 20 mcg by nebulization daily at 6 (six) AM. 05/23/22  Yes Nyoka Cowden, MD  hydrALAZINE (APRESOLINE) 100 MG tablet TAKE 1 TABLET BY MOUTH 3 TIMES  DAILY Patient taking differently: Take 100 mg by mouth 3 (three)  times daily. 07/10/22  Yes Jake Bathe, MD  latanoprost (XALATAN) 0.005 % ophthalmic solution Place 1 drop into both eyes at bedtime. 02/05/20  Yes [provider]  magnesium oxide (MAG-OX) 400 (240 Mg) MG tablet Take 1 tablet (400 mg total) by mouth daily. 05/14/21  Yes Rolly Salter, MD  memantine (NAMENDA) 10 MG tablet Take 1 tablet (10 mg total) by mouth 2 (two) times daily. 08/08/22  Yes Glean Salvo, NP  metFORMIN (GLUCOPHAGE) 1000 MG tablet Take 1,000 mg by mouth 2 (two) times daily with a meal.   Yes [provider]  montelukast (SINGULAIR) 10 MG tablet TAKE 1 TABLET BY MOUTH AT  BEDTIME 06/05/22  Yes Nyoka Cowden, MD  Multiple Vitamins-Minerals (ONE-A-DAY WOMENS PO) Take 1 tablet by mouth daily with breakfast.   Yes [provider]  nebivolol (BYSTOLIC) 10 MG tablet Take 1 tablet (10 mg total) by mouth daily. Patient taking differently: Take 10 mg by mouth 2 (two) times daily. 04/10/19  Yes Jake Bathe, MD  nitroGLYCERIN (NITROSTAT) 0.4 MG SL tablet Place 1  tablet (0.4 mg total) under the tongue every 5 (five) minutes as needed for chest pain. Don't exceed more than 3 doses in 15 mins Patient taking differently: Place 0.4 mg under the tongue every 5 (five) minutes as needed for chest pain. 06/24/21  Yes Glade Lloyd, MD  pantoprazole (PROTONIX) 40 MG tablet Take 1 tablet (40 mg total) by mouth 2 (two) times daily before a meal. 06/24/21  Yes Glade Lloyd, MD  Probiotic Product (EQ PROBIOTIC) CAPS Take 1 capsule by mouth daily.   Yes [provider]  spironolactone (ALDACTONE) 25 MG tablet Take 1 tablet (25 mg total) by mouth daily. 01/26/22 01/27/23 Yes Swinyer, Zachary George, NP    Scheduled Meds:  aspirin  81 mg Oral Daily   atorvastatin  80 mg Oral Daily   budesonide (PULMICORT) nebulizer solution  0.25 mg Nebulization BID   Chlorhexidine Gluconate Cloth  6 each Topical Daily   [START ON 08/15/2022] clopidogrel  75 mg Oral Daily   [START ON  08/15/2022] colchicine  0.6 mg Oral Daily   empagliflozin  10 mg Oral Daily   enoxaparin (LOVENOX) injection  30 mg Subcutaneous Q24H   insulin aspart  0-15 Units Subcutaneous TID WC   ipratropium-albuterol  3 mL Nebulization BID   isosorbide mononitrate  30 mg Oral Daily   latanoprost  1 drop Both Eyes QHS   losartan  25 mg Oral Daily   metFORMIN  500 mg Oral BID WC   pantoprazole  40 mg Oral Daily   predniSONE  40 mg Oral Daily   sodium chloride flush  3 mL Intravenous Q12H   Continuous Infusions:  sodium chloride     nitroGLYCERIN Stopped (08/14/22 0240)   PRN Meds:.sodium chloride, acetaminophen, albuterol, ALPRAZolam, morphine injection, nitroGLYCERIN, ondansetron (ZOFRAN) IV, mouth rinse, simethicone, sodium chloride flush  Allergies as of 08/10/2022 - Review Complete 08/10/2022  Allergen Reaction Noted   Tramadol Other (See Comments) 06/16/2020   Chlorthalidone Other (See Comments) 04/22/2021    Family History  Problem Relation Age of Onset   Allergies Mother    Asthma Mother    CAD Father 19   Diabetes Father    Hypertension Father    Congestive Heart Failure Sister        died in her 74s   CAD Sister 61    Social History   Socioeconomic History   Marital status: Married    Spouse name: Not on file   Number of children: 3   Years of education: some college   Highest education level: Not on file  Occupational History   Occupation: Occupational psychologist  Tobacco Use   Smoking status: Former    Packs/day: 1.00    Years: 30.00    Additional pack years: 0.00    Total pack years: 30.00    Types: Cigarettes    Quit date: 01/09/2004    Years since quitting: 18.6   Smokeless tobacco: Never  Vaping Use   Vaping Use: Never used  Substance and Sexual Activity   Alcohol use: No    Alcohol/week: 0.0 standard drinks of alcohol   Drug use: No   Sexual activity: Not on file  Other Topics Concern   Not on file  Social History Narrative   Pt lives with husband in  [place].   Right-handed.   2 cups caffeine per day.   Social Determinants of Health   Financial Resource Strain: Not on file  Food Insecurity: No Food Insecurity (08/12/2022)   Hunger Vital Sign  Worried About Programme researcher, broadcasting/film/video in the Last Year: Never true    Ran Out of Food in the Last Year: Never true  Transportation Needs: No Transportation Needs (08/12/2022)   PRAPARE - Administrator, Civil Service (Medical): No    Lack of Transportation (Non-Medical): No  Physical Activity: Not on file  Stress: Not on file  Social Connections: Not on file  Intimate Partner Violence: Not At Risk (08/12/2022)   Humiliation, Afraid, Rape, and Kick questionnaire    Fear of Current or Ex-Partner: No    Emotionally Abused: No    Physically Abused: No    Sexually Abused: No    Review of Systems: All negative except as stated above in HPI.  Physical Exam: Vital signs: Vitals:   08/14/22 1000 08/14/22 1135  BP: (!) 149/85 (!) 141/79  Pulse: 73 79  Resp: 20 17  Temp:  97.7 F (36.5 C)  SpO2: 95% 96%   Last BM Date : 08/13/22 General:   Alert,  Well-developed, well-nourished, pleasant and cooperative in NAD Lungs: No visible respiratory distress Heart:  Regular rate and rhythm; no murmurs, clicks, rubs,  or gallops. Abdomen: Soft, nontender, nondistended, bowel sounds present, no peritoneal signs Mood and affect normal Alert and oriented x 3 Rectal:  Deferred  GI:  Lab Results: Recent Labs    08/14/22 0739  WBC 9.1  HGB 10.6*  HCT 32.4*  PLT 255   BMET Recent Labs    08/14/22 0739  NA 136  K 3.6  CL 102  CO2 23  GLUCOSE 111*  BUN 18  CREATININE 1.21*  CALCIUM 9.1   LFT No results for input(s): "PROT", "ALBUMIN", "AST", "ALT", "ALKPHOS", "BILITOT", "BILIDIR", "IBILI" in the last 72 hours. PT/INR No results for input(s): "LABPROT", "INR" in the last 72 hours.   Studies/Results: No results found.  Impression/Plan: -Chronic dysphagia.  Barium  swallow negative for stenosis or stricture in the past.  Barium swallow today showed possible dysmotility according to patient.  Full report not available to review. -Acute STEMI.  S/p PCI on July 11, 2022.  Currently on dual antiplatelet therapy.  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  No acute issues.  Do not recommend any further workup at this time in setting of acute STEMI. -Patient was advised to follow-up with the primary GI doctor in 4 to 6 months after discharge to discuss further management options for her chronic dysphagia.  She needs to chew food well and moist food before swallowing. -GI will sign off.  Call us back if needed.    LOS: 4 days   Kathi Der  MD, FACP 08/14/2022, 11:41 AM  Contact #  [phone]

## 2022-08-14 NOTE — TOC Initial Note (Signed)
Transition of Care [facility]) - Initial/Assessment Note    Patient Details  Name: [name] MRN: [id number] Date of Birth: Mar 03, 1951  Transition of [facility]) CM/SW Contact:    Gala Lewandowsky, RN Phone Number: 08/14/2022, 12:48 PM  Clinical Narrative: Patient was discussed in progression rounds this morning. Patient presented for chest pain-post LHC-plan for home on Plavix. Case Manager will continue to follow for transition of care needs as the patient progresses.                Expected Discharge Plan: Home/Self Care Barriers to Discharge: No Barriers Identified   Patient Goals and CMS Choice Patient states their goals for this hospitalization and ongoing recovery are:: plan will be to return home.   Choice offered to / list presented to : NA      Expected Discharge Plan and Services In-house Referral: NA Discharge Planning Services: CM Consult Post Acute Care Choice: NA Living arrangements for the past 2 months: Single Family Home                   DME Agency: NA       HH Arranged: NA    Prior Living Arrangements/Services Living arrangements for the past 2 months: Single Family Home Lives with:: Spouse Patient language and need for interpreter reviewed:: Yes Do you feel safe going back to the place where you live?: Yes      Need for Family Participation in Patient Care: No (Comment) Care giver support system in place?: No (comment)   Criminal Activity/Legal Involvement Pertinent to Current Situation/Hospitalization: No - Comment as needed  Activities of Daily Living Home Assistive Devices/Equipment: None ADL Screening (condition at time of admission) Patient's cognitive ability adequate to safely complete daily activities?: Yes Is the patient deaf or have difficulty hearing?: No Does the patient have difficulty seeing, even when wearing glasses/contacts?: No Does the patient have difficulty concentrating, remembering, or making decisions?:  No Patient able to express need for assistance with ADLs?: Yes Does the patient have difficulty dressing or bathing?: No Independently performs ADLs?: Yes (appropriate for developmental age) Does the patient have difficulty walking or climbing stairs?: No Weakness of Legs: None Weakness of Arms/Hands: None  Permission Sought/Granted Permission sought to share information with : Family Supports, Case Manager     Emotional Assessment Appearance:: Appears stated age       Alcohol / Substance Use: Not Applicable Psych Involvement: No (comment)  Admission diagnosis:  NSTEMI (non-ST elevated myocardial infarction) [I21.4] Moderate persistent asthma with status asthmaticus [J45.42] Anemia, unspecified type [D64.9] STEMI (ST elevation myocardial infarction) [I21.3] Patient Active Problem List   Diagnosis Date Noted   STEMI (ST elevation myocardial infarction) 08/10/2022   Abdominal pain 09/30/2021   AKI (acute kidney injury) 09/29/2021   Diarrhea 09/29/2021   COPD with acute exacerbation 08/20/2021   Mild cognitive impairment 08/14/2021   Insomnia 08/01/2021   Localized edema 08/01/2021   Major depression, single episode 08/01/2021   Menopausal symptom 08/01/2021   Polyneuropathy due to type 2 diabetes mellitus 08/01/2021   Vitamin D deficiency 08/01/2021   Obesity (BMI 30-39.9) 06/22/2021   Leukocytosis 06/22/2021   Hyponatremia 06/22/2021   Anemia of chronic disease 06/22/2021   Anxiety 06/19/2021   Biphasic stridor    Respiratory distress    Hypomagnesemia 05/13/2021   Dental caries 04/20/2021   Abnormal swallowing 03/29/2021   Acute non-ST segment elevation myocardial infarction 03/29/2021   Chronic diarrhea 03/29/2021   Daytime somnolence 03/29/2021  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   Abnormal mammogram of left breast 12/13/2020   Memory loss 03/20/2019   Severe persistent asthma without complication 04/04/2018   DM2 (diabetes mellitus,  type 2) 03/15/2018   Chronic kidney disease, stage 3a 03/15/2018   Dyspnea 08/12/2017   Macrocytic anemia 08/12/2017   Therapeutic drug monitoring 05/17/2017   Lung nodule 08/01/2016   Chronic diastolic CHF (congestive heart failure) 07/17/2016   Acute renal failure superimposed on stage 3b chronic kidney disease 07/17/2016   Right leg swelling 02/27/2016   Uncontrolled type 2 diabetes mellitus with complication    Seizures 01/27/2016   Numbness 01/27/2016   CAD in native artery 01/20/2016   Hypertensive heart disease 01/20/2016   NSTEMI (non-ST elevated myocardial infarction) 01/18/2016   Dyspnea on exertion    Upper airway cough syndrome with VCD 08/28/2014   Diabetes mellitus type 2, controlled 08/06/2014   COPD exacerbation (HCC) 03/09/2012   Allergic rhinitis 07/22/2008   Vocal cord dysfunction 07/22/2008   DYSPNEA 07/22/2008   Type 2 diabetes mellitus with hyperglycemia 07/21/2008   DIABETIC PERIPHERAL NEUROPATHY 07/21/2008   Depression with anxiety 07/21/2008   Essential hypertension 07/21/2008   Mild persistent chronic asthma without complication 07/21/2008   GERD (gastroesophageal reflux disease) 07/21/2008   PULMONARY EMBOLISM, HX OF 07/21/2008   PCP:  [name], MD Pharmacy:   [facility] - [address] ([place]), [place] - [address] [place] ([place]) [place] [address] Phone: [phone] Fax: [phone]  [facility] - [place], [place] - [address] [place] [address] Phone: [phone] Fax: [phone]  [facility] [address] [place] [address] Phone: [phone] Fax: [phone]  Social Determinants of Health (SDOH) Social History: SDOH Screenings   Food Insecurity: No Food Insecurity (08/12/2022)  Housing: Low Risk  (08/12/2022)  Transportation Needs: No Transportation Needs (08/12/2022)  Utilities: Not At Risk  (08/12/2022)  Tobacco Use: Medium Risk (08/10/2022)   Readmission Risk Interventions    08/14/2022   12:46 PM 08/22/2021   10:19 AM  Readmission Risk Prevention Plan  Transportation Screening Complete Complete  HRI or Home Care Consult Complete   Social Work Consult for Recovery Care Planning/Counseling Complete   Palliative Care Screening Not Applicable   Medication Review Oceanographer) Referral to Pharmacy Complete  PCP or Specialist appointment within 3-5 days of discharge  Complete  HRI or Home Care Consult  Complete  SW Recovery Care/Counseling Consult  Complete  Palliative Care Screening  Not Applicable  Skilled Nursing Facility  Not Applicable

## 2022-08-14 NOTE — Progress Notes (Addendum)
Rounding Note    Patient Name: [name] Date of Encounter: 08/14/2022  [facility] Cardiologist: Donato Schultz, MD   Subjective   No acute events overnight.  Pleuritic chest pain is somewhat improved.  Still with odynophagia and has esophagram scheduled.  Otherwise feels well.  Inpatient Medications    Scheduled Meds:  aspirin  81 mg Oral Daily   atorvastatin  80 mg Oral Daily   budesonide (PULMICORT) nebulizer solution  0.25 mg Nebulization BID   Chlorhexidine Gluconate Cloth  6 each Topical Daily   colchicine  0.6 mg Oral BID   empagliflozin  10 mg Oral Daily   enoxaparin (LOVENOX) injection  30 mg Subcutaneous Q24H   insulin aspart  0-15 Units Subcutaneous TID WC   ipratropium-albuterol  3 mL Nebulization BID   isosorbide mononitrate  30 mg Oral Daily   latanoprost  1 drop Both Eyes QHS   metFORMIN  500 mg Oral BID WC   metoprolol tartrate  12.5 mg Oral BID   pantoprazole  40 mg Oral Daily   predniSONE  40 mg Oral Daily   sodium chloride flush  3 mL Intravenous Q12H   ticagrelor  90 mg Oral BID   Continuous Infusions:  sodium chloride     nitroGLYCERIN Stopped (08/14/22 0240)   PRN Meds: sodium chloride, acetaminophen, albuterol, ALPRAZolam, morphine injection, nitroGLYCERIN, ondansetron (ZOFRAN) IV, mouth rinse, simethicone, sodium chloride flush   Vital Signs    Vitals:   08/14/22 0600 08/14/22 0700 08/14/22 0735 08/14/22 0800  BP: (!) 164/77 (!) 166/74  (!) 152/95  Pulse: 67 72  71  Resp: Temp:   97.7 F (36.5 C)   TempSrc:   Oral   SpO2: 97% 97%  95%  Weight:      Height:        Intake/Output Summary (Last 24 hours) at 08/14/2022 0820 Last data filed at 08/14/2022 0300 Gross per 24 hour  Intake 329.71 ml  Output --  Net 329.71 ml      08/14/2022    3:36 AM 08/13/2022    3:45 AM 08/12/2022    3:20 AM  Last 3 Weights  Weight (lbs) 145 lb 11.6 oz 150 lb 2.1 oz 148 lb 5.9 oz  Weight (kg) 66.1 kg 68.1 kg 67.3 kg       Telemetry    Sinus rhythm inf TWI-personally reviewed  ECG    None new  Physical Exam   GEN: Well nourished, well developed, in no acute distress  HEENT: normal  Neck: no JVD, carotid bruits, or masses Cardiac: RRR; no murmurs, rubs, or gallops,no edema  Respiratory:  clear to auscultation bilaterally, normal work of breathing GI: soft, nontender, nondistended, + BS MS: no deformity or atrophy  Skin: warm and dry Neuro:  Strength and sensation are intact Psych: euthymic mood, full affect   Labs    High Sensitivity Troponin:   Recent Labs  Lab 08/10/22 0507 08/10/22 0716  TROPONINIHS 223* 168*     Chemistry Recent Labs  Lab 08/10/22 0507 08/10/22 0517 08/11/22 0129  NA 137 139 136  K 4.0 3.8 5.4*  CL 104  --  107  CO2 22  --  23  GLUCOSE 121*  --  202*  BUN 23  --  28*  CREATININE 1.49*  --  1.47*  CALCIUM 9.5  --  8.9  GFRNONAA 37*  --  37*  ANIONGAP 11  --  6  Lipids  Recent Labs  Lab 08/12/22 0035  CHOL 135  TRIG 188*  HDL 50  LDLCALC 47  CHOLHDL 2.7    Hematology Recent Labs  Lab 08/10/22 0507 08/10/22 0517 08/11/22 0129 08/14/22 0739  WBC 11.6*  --  12.6* 9.1  RBC 3.61*  --  2.89* 3.30*  HGB 11.8* 11.2* 9.3* 10.6*  HCT 36.5 33.0* 28.7* 32.4*  MCV 101.1*  --  99.3 98.2  MCH 32.7  --  32.2 32.1  MCHC 32.3  --  32.4 32.7  RDW 12.6  --  12.9 12.6  PLT 271  --  238 255   Thyroid No results for input(s): "TSH", "FREET4" in the last 168 hours.  BNP Recent Labs  Lab 08/10/22 0507  BNP 74.4    DDimer No results for input(s): "DDIMER" in the last 168 hours.   Radiology    No results found.  Cardiac Studies   2D echocardiogram (08/11/2022)  IMPRESSIONS     1. Windows are callenging. Left ventricular ejection fraction, by  estimation, is 60 to 65%. The left ventricle has normal function.   2. Right ventricular systolic function is normal. The right ventricular  size is normal.   3. A small pericardial effusion is present.  There is no evidence of  cardiac tamponade.   Comparison(s): No significant change from prior study.    Cardiac catheterization/PCI and stent (08/10/2022)   Conclusion      Prox RCA to Dist RCA lesion is 10% stenosed.   Prox RCA lesion is 75% stenosed.   RPAV lesion is 99% stenosed.   A stent was successfully placed.   A stent was successfully placed.   A stent was successfully placed.   Post intervention, there is a 0% residual stenosis.   Post intervention, there is a 0% residual stenosis.   Post intervention, there is a 0% residual stenosis.   1.  High-grade proximal and ostial right coronary artery disease treated with shockwave lithotripsy and 1 drug-eluting stent. 2.  High-grade distal right coronary artery disease treated with 1 drug-eluting stent with plaque shift into the jailed PDA branch.  This was complicated by a small distal wire perforation.  The patient was not hypotensive and a stat echocardiogram obtained in the cardiac catheterization laboratory demonstrated no significant pericardial effusion. 3.  Patent proximal and mid right coronary artery stents. 4.  Mild disease of the LAD. 5.  LVEDP of 14 mmHg.   Recommendation: Dual antiplatelet therapy for at least 1 year.  Echocardiogram will be done later today to assess for pericardial effusion.  The case was reviewed with Dr. Gery Pray the team C attending.  Coronary Diagrams  Diagnostic Dominance: Right  Intervention     Patient Profile     [name] is a 72 y.o. female with past medical history of CAD status post DES x 2 to RCA '17, hypertension, hyperlipidemia, GERD, diabetes who is being seen 08/10/2022 for the evaluation of chest pain/inferior STEMI.   Assessment & Plan    1.  Inferior STEMI: Doing well.  Cannot afford Ticagrelor so will load with plavix 600mg  and start plavix 75mg  qday.  EF normal so will d/c metoprolol given REDUCE-AMI trial results.  2.  Hypertension: Start losartan 25mg  qday.    3.  Hyperlipidemia: Continue atorvastatin 80 mg   4.  Diabetes: Continue sliding scale insulin and metformin; cont [name], ASA, atorvastatin, and start losartan 25mg .  5.  Pleuritic chest pain: Due to small pericardial effusion.  Cont colchicine 0.6 qday (renally dosed) x 3 months and 2 week taper of steroids  6.  Dysphagia/odynophagia: Esophogram today, will discuss with GI.  Patient has a history of esophageal spasm with dilatation in the past.  7.  CKD stage IIIa: Monitor for now.  8.  Wire perforation:  Small distal wire perforation with small effusion that is not hemodynamically an issue.    9.  Disposition:  Possible d/c today pending GI recs.   For questions or updates, please contact  [facility] Please consult [email or web address] for contact info under        Signed, Orbie Pyo, MD  08/14/2022, 8:20 AM

## 2022-08-14 NOTE — Progress Notes (Signed)
CARDIAC REHAB PHASE I   PRE:  Rate/Rhythm: 82 SR  BP:  Sitting: 122/74      SaO2: 98  MODE:  Ambulation: 170 ft   POST:  Rate/Rhythm: 87 SR                                       SaO2: 97 RA  Pt ambulated independently in hall, moving at slow steady pace. Tolerated well with no CP, dizziness or SOB. Returned to room with call bell and bedside table in reach. Plan for discharge home later today.  [id number]     Woodroe Chen, RN BSN 08/14/2022 2:11 PM

## 2022-08-14 NOTE — Plan of Care (Signed)
Plan of Care met and discussed with patient.

## 2022-08-14 NOTE — Progress Notes (Signed)
Pt stated she did not feel like she need a neb at the moment. I told the patient if she felt SOB to tell the RN and RT will give the treatment. Pt understood. Pt did not appear to be in any distress or have SOB.

## 2022-08-20 NOTE — Progress Notes (Unsigned)
Office Visit    Patient Name: [name] Date of Encounter: 08/22/2022  Primary Care Provider:  [name], MD Primary Cardiologist:  Donato Schultz, MD Primary Electrophysiologist: None   Past Medical History    Past Medical History:  Diagnosis Date   Anxiety    Arthritis    Asthma    Chronic diastolic CHF (congestive heart failure) 07/17/2016   Coronary artery disease    a. NSTEMI 12/2015 - LHC 01/18/16: s/p overlapping DESx2 to RCA, 10% ost-prox Cx,10% mLAD.   Dementia    Depression    Diabetes mellitus    Dyslipidemia    GERD (gastroesophageal reflux disease)    History of seizure    Hypertension    Hypertensive heart disease    NSTEMI (non-ST elevated myocardial infarction) 12/2015   Stage 3 chronic kidney disease    Past Surgical History:  Procedure Laterality Date   ABDOMINAL HYSTERECTOMY     CARDIAC CATHETERIZATION  1994   minimal LAD dz, no other dz, EF normal   CARDIAC CATHETERIZATION N/A 01/18/2016   Procedure: Left Heart Cath and Coronary Angiography;  Surgeon: Kathleene Hazel, MD;  Location: [facility] INVASIVE CV LAB;  Service: Cardiovascular;  Laterality: N/A;   CARDIAC CATHETERIZATION N/A 01/18/2016   Procedure: Coronary Stent Intervention;  Surgeon: Kathleene Hazel, MD;  Location: [facility] INVASIVE CV LAB;  Service: Cardiovascular;  Laterality: N/A;   COLONOSCOPY WITH PROPOFOL N/A 04/09/2014   Procedure: COLONOSCOPY WITH PROPOFOL;  Surgeon: Florencia Reasons, MD;  Location: [facility] ENDOSCOPY;  Service: Endoscopy;  Laterality: N/A;   CORONARY LITHOTRIPSY N/A 08/10/2022   Procedure: CORONARY LITHOTRIPSY;  Surgeon: Orbie Pyo, MD;  Location: [facility] INVASIVE CV LAB;  Service: Cardiovascular;  Laterality: N/A;   CORONARY STENT INTERVENTION N/A 08/10/2022   Procedure: CORONARY STENT INTERVENTION;  Surgeon: Orbie Pyo, MD;  Location: [facility] INVASIVE CV LAB;  Service: Cardiovascular;  Laterality: N/A;   CORONARY STENT PLACEMENT  01/19/2016   STENT SYNERGY  DES 3.08M57 drug eluting stent was successfully placed, and overlaps the 2.5 x 38 mm Synergy stent placed distally.   CORONARY/GRAFT ACUTE MI REVASCULARIZATION N/A 08/10/2022   Procedure: Coronary/Graft Acute MI Revascularization;  Surgeon: Orbie Pyo, MD;  Location: [facility] INVASIVE CV LAB;  Service: Cardiovascular;  Laterality: N/A;   ESOPHAGOGASTRODUODENOSCOPY (EGD) WITH PROPOFOL N/A 04/09/2014   Procedure: ESOPHAGOGASTRODUODENOSCOPY (EGD) WITH PROPOFOL;  Surgeon: Florencia Reasons, MD;  Location: [facility] ENDOSCOPY;  Service: Endoscopy;  Laterality: N/A;   FOOT SURGERY Bilateral    approx ~2000   HEMORRHOID SURGERY     HERNIA REPAIR     KNEE ARTHROSCOPY     LEFT HEART CATH AND CORONARY ANGIOGRAPHY N/A 08/10/2022   Procedure: LEFT HEART CATH AND CORONARY ANGIOGRAPHY;  Surgeon: Orbie Pyo, MD;  Location: [facility] INVASIVE CV LAB;  Service: Cardiovascular;  Laterality: N/A;   RADIOACTIVE SEED GUIDED EXCISIONAL BREAST BIOPSY Left 01/20/2021   Procedure: RADIOACTIVE SEED GUIDED EXCISIONAL LEFT BREAST BIOPSY;  Surgeon: Almond Lint, MD;  Location: [facility] OR;  Service: General;  Laterality: Left;    Allergies  Allergies  Allergen Reactions   Tramadol Other (See Comments)    Not to be taken because of existing seizure disorder/SEIZURES    Chlorthalidone Other (See Comments)    Was stopped due to kidney function     History of Present Illness    [name]  is a 72 year old female with a PMH of CAD s/p inferior STEMI 07/2022 with proximal/ostial  RCA disease treated with shockwave lithotripsy and DES x 1 with DES x 1 to jailed PDA branch, NSTEMI 2017 with 90% distal RCA/mRCA treated with PTCA/DES x 2 overlapping, HFpEF, HTN, HLD, DM type II, COPD, GERD, CKD stage III, seizures who presents today for post STEMI follow-up.  Ms. Staples presented to the ED 08/10/2022 with complaint of substernal chest pain with shortness of breath who was found to have inferior STEMI.  She underwent left heart cath and  was noted to have high-grade proximal seal/ostial RCA disease treated with shockwave lithotripsy and DES x 1 as well as DES x 1 to jailed PDA branch.  She was placed on DAPT with ASA and Brilinta initially and was transition to Plavix prior to discharge.  2D echo was completed postprocedure with EF of 60 to 65% and normal LV function with small pericardial effusion and no evidence of tamponade.  She developed pleuritic chest pain due to small wire perforation in distal RCA.  She was placed on colchicine 0.6 mg twice daily and prednisone 2-week taper.  She was discharged stable condition 08/14/2022.  Since last being seen in the office patient reports that she is doing much better but still has chest discomfort when leaning forward and deep breathing and coughing.  She denies any fevers or chills and is compliant with her current medications.  She does note some diarrhea with the colchicine that is relieved with as needed Imodium.  During her visit today we reviewed her Cath Lab record and questions were answered regarding her stents that were placed.  We also discussed the importance of medication compliance and cardiac rehab.  She is interested in participating at this time and is looking forward to regaining her strength.  Patient denies chest pain, palpitations, dyspnea, PND, orthopnea, nausea, vomiting, dizziness, syncope, edema, weight gain, or early satiety.  Home Medications    Current Outpatient Medications  Medication Sig Dispense Refill   albuterol (PROAIR HFA) 108 (90 Base) MCG/ACT inhaler Inhale 2 puffs into the lungs every 4 (four) hours as needed for wheezing or shortness of breath. 18 g 2   albuterol (PROVENTIL) (2.5 MG/3ML) 0.083% nebulizer solution Take 3 mLs (2.5 mg total) by nebulization every 4 (four) hours as needed for wheezing or shortness of breath. 75 mL 12   ALPRAZolam (XANAX) 0.5 MG tablet Take 1 tablet (0.5 mg total) by mouth 2 (two) times daily as needed for anxiety. 30 tablet 0    aspirin 81 MG chewable tablet Chew 1 tablet (81 mg total) by mouth daily. 90 tablet 2   atorvastatin (LIPITOR) 80 MG tablet Take 1 tablet (80 mg total) by mouth daily. 90 tablet 1   Biotin 5000 MCG CAPS Take 5,000 mcg by mouth daily.     budesonide (PULMICORT) 0.5 MG/2ML nebulizer solution USE 1 VIAL IN NEBULIZER IN THE MORNING AND AT BEDTIME 120 mL 11   clopidogrel (PLAVIX) 75 MG tablet Take 1 tablet (75 mg total) by mouth daily. 90 tablet 2   colchicine 0.6 MG tablet Take 1 tablet (0.6 mg total) by mouth daily. 90 tablet 0   divalproex (DEPAKOTE ER) 500 MG 24 hr tablet Take 1 tablet (500 mg total) by mouth at bedtime. 90 tablet 3   empagliflozin ([name]) 10 MG TABS tablet Take 1 tablet (10 mg total) by mouth daily. 30 tablet 2   ferrous sulfate 325 (65 FE) MG tablet Take 325 mg by mouth daily with breakfast.     formoterol (PERFOROMIST) 20 MCG/2ML nebulizer  solution USE 1 VIAL VIA NEBULIZER TWICE  DAILY IN THE MORNING AND EVENING (Patient taking differently: USE 1 VIAL VIA NEBULIZER TWICE  DAILY IN THE MORNING AND EVENING) 360 mL 3   isosorbide mononitrate (IMDUR) 30 MG 24 hr tablet Take 1 tablet (30 mg total) by mouth daily. 90 tablet 1   latanoprost (XALATAN) 0.005 % ophthalmic solution Place 1 drop into both eyes at bedtime.     losartan (COZAAR) 25 MG tablet Take 1 tablet (25 mg total) by mouth daily. 90 tablet 0   magnesium oxide (MAG-OX) 400 (240 Mg) MG tablet Take 1 tablet (400 mg total) by mouth daily. 60 tablet 0   memantine (NAMENDA) 10 MG tablet Take 1 tablet (10 mg total) by mouth 2 (two) times daily. 180 tablet 3   metFORMIN (GLUCOPHAGE) 1000 MG tablet Take 1,000 mg by mouth 2 (two) times daily with a meal.     montelukast (SINGULAIR) 10 MG tablet TAKE 1 TABLET BY MOUTH AT  BEDTIME 90 tablet 3   Multiple Vitamins-Minerals (ONE-A-DAY WOMENS PO) Take 1 tablet by mouth daily with breakfast.     nitroGLYCERIN (NITROSTAT) 0.4 MG SL tablet Place 1 tablet (0.4 mg total) under the  tongue every 5 (five) minutes as needed for chest pain. Don't exceed more than 3 doses in 15 mins 30 tablet 0   pantoprazole (PROTONIX) 40 MG tablet Take 1 tablet (40 mg total) by mouth 2 (two) times daily before a meal. 60 tablet 0   Probiotic Product (EQ PROBIOTIC) CAPS Take 1 capsule by mouth daily.     No current facility-administered medications for this visit.     Review of Systems  Please see the history of present illness.    (+) Chest wall pain (+) Fatigue  All other systems reviewed and are otherwise negative except as noted above.  Physical Exam    Wt Readings from Last 3 Encounters:  08/22/22 142 lb 3.2 oz (64.5 kg)  08/14/22 145 lb 11.6 oz (66.1 kg)  08/08/22 150 lb (68 kg)   VS: Vitals:   08/22/22 1357  BP: 124/78  Pulse: 91  SpO2: 98%  ,Body mass index is 26.01 kg/m.  Constitutional:      Appearance: Healthy appearance. Not in distress.  Neck:     Vascular: JVD normal.  Pulmonary:     Effort: Pulmonary effort is normal.     Breath sounds: No wheezing. No rales. Diminished in the bases Cardiovascular:     Normal rate. Regular rhythm. Normal S1. Normal S2.  Patient's right wrist is clean dry and intact with no evidence of hematoma with slight ecchymosis noted in the mid forearm area    Murmurs: There is no murmur.  Edema:    Peripheral edema absent.  Abdominal:     Palpations: Abdomen is soft non tender. There is no hepatomegaly.  Skin:    General: Skin is warm and dry.  Neurological:     General: No focal deficit present.     Mental Status: Alert and oriented to person, place and time.     Cranial Nerves: Cranial nerves are intact.  EKG/LABS/ Recent Cardiac Studies    ECG personally reviewed by me today -none completed today  Cardiac Studies & Procedures   CARDIAC CATHETERIZATION  CARDIAC CATHETERIZATION 08/10/2022  Narrative   Prox RCA to Dist RCA lesion is 10% stenosed.   Prox RCA lesion is 75% stenosed.   RPAV lesion is 99% stenosed.   A  stent was  successfully placed.   A stent was successfully placed.   A stent was successfully placed.   Post intervention, there is a 0% residual stenosis.   Post intervention, there is a 0% residual stenosis.   Post intervention, there is a 0% residual stenosis.  1.  High-grade proximal and ostial right coronary artery disease treated with shockwave lithotripsy and 1 drug-eluting stent. 2.  High-grade distal right coronary artery disease treated with 1 drug-eluting stent with plaque shift into the jailed PDA branch.  This was complicated by a small distal wire perforation.  The patient was not hypotensive and a stat echocardiogram obtained in the cardiac catheterization laboratory demonstrated no significant pericardial effusion. 3.  Patent proximal and mid right coronary artery stents. 4.  Mild disease of the LAD. 5.  LVEDP of 14 mmHg.  Recommendation: Dual antiplatelet therapy for at least 1 year.  Echocardiogram will be done later today to assess for pericardial effusion.  The case was reviewed with Dr. Gery Pray the team C attending.  Findings Coronary Findings Diagnostic  Dominance: Right  Left Anterior Descending There is mild diffuse disease throughout the vessel.  Right Coronary Artery Prox RCA lesion is 75% stenosed. Prox RCA to Dist RCA lesion is 10% stenosed. The lesion was previously treated .  Right Posterior Atrioventricular Artery RPAV lesion is 99% stenosed.  Intervention  Prox RCA lesion Stent A stent was successfully placed. Post-Intervention Lesion Assessment The intervention was successful. Pre-interventional TIMI flow is 3. Post-intervention TIMI flow is 3. There is a 0% residual stenosis post intervention.  Prox RCA to Dist RCA lesion Stent A stent was successfully placed. Post-Intervention Lesion Assessment The intervention was successful. Pre-interventional TIMI flow is 3. Post-intervention TIMI flow is 3. There is a 0% residual stenosis post  intervention.  RPAV lesion Stent A stent was successfully placed. Post-Intervention Lesion Assessment The intervention was successful. Pre-interventional TIMI flow is 3. Post-intervention TIMI flow is 3. There is a 0% residual stenosis post intervention.   CARDIAC CATHETERIZATION  CARDIAC CATHETERIZATION 01/18/2016  Narrative  Mid RCA lesion, 70 %stenosed.  Dist RCA lesion, 90 %stenosed.  A STENT SYNERGY DES [id number] drug eluting stent was successfully placed, and overlaps the 2.5 x 38 mm Synergy stent placed distally.  Prox RCA lesion, 80 %stenosed.  Post intervention, there is a 0% residual stenosis.  Ost Cx to Prox Cx lesion, 10 %stenosed.  Mid LAD lesion, 10 %stenosed.  1. NSTEMI 2. Severe single vessel CAD with severe stenosis in the mid and distal RCA 3. Successful PTCA/DES x 2 mid and distal RCA  Recommendations: Dual anti-platelet therapy with ASA and Brilinta for one year. Continue statin.  Findings Coronary Findings Diagnostic  Dominance: Right  Left Anterior Descending  First Diagonal Branch Vessel is moderate in size.  Second Diagonal Branch Vessel is small in size.  Third Diagonal Branch Vessel is small in size.  Left Circumflex Vessel is moderate in size.  First Obtuse Marginal Branch Vessel is moderate in size.  Second Obtuse Marginal Branch Vessel is small in size.  Right Coronary Artery  Intervention  Prox RCA lesion Angioplasty (Also treats lesions: Mid RCA, and Dist RCA) Lesion crossed with guidewire. Pre-stent angioplasty was performed using a BALLOON EMERGE MR 2.0X12. A STENT SYNERGY DES [id number] drug eluting stent was successfully placed, and overlaps previously placed stent. Stent strut is well apposed. Post-stent angioplasty was performed using a BALLOON [place] EMERGE MR 2.75X30. The pre-interventional distal flow is normal (TIMI 3).  The post-interventional distal flow is  normal (TIMI 3). The intervention was successful . No  complications occurred at this lesion. There is a 0% residual stenosis post intervention.  Mid RCA lesion Angioplasty (Also treats lesions: Prox RCA, and Dist RCA) See details in Prox RCA lesion. There is a 0% residual stenosis post intervention.  Dist RCA lesion Angioplasty (Also treats lesions: Prox RCA, and Mid RCA) See details in Prox RCA lesion. There is a 0% residual stenosis post intervention.   STRESS TESTS  MYOCARDIAL PERFUSION IMAGING 02/06/2022  Narrative   The study is normal. The study is low risk.   No ST deviation was noted.   LV perfusion is normal.   Left ventricular function is normal. Nuclear stress EF: 71 %. The left ventricular ejection fraction is hyperdynamic (>65%). End diastolic cavity size is normal.   Prior study available for comparison from 05/13/2020. No significant change from prior study.   ECHOCARDIOGRAM  ECHOCARDIOGRAM LIMITED 08/11/2022  Narrative ECHOCARDIOGRAM LIMITED REPORT    Patient Name:   [name] Date of Exam: 08/11/2022 Medical Rec #:  [id number]      Height:       62.0 in Accession #:    [id number]     Weight:       152.0 lb Date of Birth:  1951-03-01       BSA:          1.701 m Patient Age:    71 years       BP:           139/80 mmHg Patient Gender: F              HR:           89 bpm. Exam Location:  Inpatient  Procedure: 2D Echo and Intracardiac Opacification Agent  Indications:    Pericardial Effusion  History:        Patient has prior history of Echocardiogram examinations, most recent 08/10/2022. CHF, CAD and Acute MI; COPD. Pericardial effusion from wire perf on 08/10/22.  Sonographer:    Milbert Coulter Referring Phys: Laverda Page, B  IMPRESSIONS   1. Windows are callenging. Left ventricular ejection fraction, by estimation, is 60 to 65%. The left ventricle has normal function. 2. Right ventricular systolic function is normal. The right ventricular size is normal. 3. A small pericardial effusion is present.  There is no evidence of cardiac tamponade.  Comparison(s): No significant change from prior study.  FINDINGS Left Ventricle: Windows are callenging. Left ventricular ejection fraction, by estimation, is 60 to 65%. The left ventricle has normal function. Definity contrast agent was given IV to delineate the left ventricular endocardial borders.  Right Ventricle: The right ventricular size is normal. Right ventricular systolic function is normal.  Pericardium: A small pericardial effusion is present. There is no evidence of cardiac tamponade.  Carolan Clines Electronically signed by Carolan Clines Signature Date/Time: 08/11/2022/8:29:49 AM    Final             Lab Results  Component Value Date   WBC 9.1 08/14/2022   HGB 10.6 (L) 08/14/2022   HCT 32.4 (L) 08/14/2022   MCV 98.2 08/14/2022   PLT 255 08/14/2022   Lab Results  Component Value Date   CREATININE 1.21 (H) 08/14/2022   BUN 18 08/14/2022   NA 136 08/14/2022   K 3.6 08/14/2022   CL 102 08/14/2022   CO2 23 08/14/2022   Lab Results  Component Value Date   ALT 17 10/04/2021   AST  18 10/04/2021   ALKPHOS 62 10/04/2021   BILITOT 0.6 10/04/2021   Lab Results  Component Value Date   CHOL 135 08/12/2022   HDL 50 08/12/2022   LDLCALC 47 08/12/2022   TRIG 188 (H) 08/12/2022   CHOLHDL 2.7 08/12/2022    Lab Results  Component Value Date   HGBA1C 6.3 (H) 08/10/2022     Assessment & Plan    1.  Coronary artery disease: -s/p inferior MI with high-grade proximal seal/ostial RCA disease treated with shockwave lithotripsy and DES x 1 with DES x 1 to jailed PDA branch complicated by small wire distal perforation with small pericardial effusion -Today patient reports some occasional chest pain and discomfort with leaning forward.  She denies any anginal equivalent pain which was substernal -She is cleared to start cardiac rehab -Continue GDMT with Lipitor 80 mg, Plavix 75 mg, colchicine 0.6 mg, Imdur 30 mg, ASA 81 mg  2.   Pleuritic chest pain: -Small large distal perforation during PCI with small PE that was stable by 2D echo -Continue colchicine 0.6 mg daily x 3 months and prednisone taper -She was advised to use as needed Tylenol for lingering discomfort. -We will repeat 2D echo to evaluate pericardial effusion -Repeat CBC today  3.  Essential hypertension: -Patient's blood pressure today was well-controlled at 124/78 -Continue losartan 25 mg daily  4.  Hyperlipidemia: -Patient's LDL cholesterol was 47 -Continue Lipitor 80 mg daily  5.  CKD stage IIIa: -Patient's most recent creatinine was 1.2 -We will recheck BMET today     Cardiac Rehabilitation Eligibility Assessment       Disposition: Follow-up with Donato Schultz, MD or APP in 1 months    Medication Adjustments/Labs and Tests Ordered: Current medicines are reviewed at length with the patient today.  Concerns regarding medicines are outlined above.   Signed, Napoleon Form, Leodis Rains, NP 08/22/2022, 2:30 PM [facility]

## 2022-08-22 ENCOUNTER — Encounter: Payer: Self-pay | Admitting: Nurse Practitioner

## 2022-08-22 ENCOUNTER — Ambulatory Visit: Payer: Medicare Other | Attending: Nurse Practitioner | Admitting: Nurse Practitioner

## 2022-08-22 VITALS — BP 124/78 | HR 91 | Ht 62.0 in | Wt 142.2 lb

## 2022-08-22 DIAGNOSIS — N1831 Chronic kidney disease, stage 3a: Secondary | ICD-10-CM

## 2022-08-22 DIAGNOSIS — I308 Other forms of acute pericarditis: Secondary | ICD-10-CM

## 2022-08-22 DIAGNOSIS — I2510 Atherosclerotic heart disease of native coronary artery without angina pectoris: Secondary | ICD-10-CM | POA: Diagnosis not present

## 2022-08-22 DIAGNOSIS — I10 Essential (primary) hypertension: Secondary | ICD-10-CM

## 2022-08-22 DIAGNOSIS — E785 Hyperlipidemia, unspecified: Secondary | ICD-10-CM

## 2022-08-22 NOTE — Patient Instructions (Signed)
Medication Instructions:  OK TO CONTINUE TYLENOL *If you need a refill on your cardiac medications before your next appointment, please call your pharmacy*   Lab Work: TODAY-BMET & CBC If you have labs (blood work) drawn today and your tests are completely normal, you will receive your results only by: MyChart Message (if you have MyChart) OR A paper copy in the mail If you have any lab test that is abnormal or we need to change your treatment, we will call you to review the results.   Testing/Procedures: Your physician has requested that you have an echocardiogram. Echocardiography is a painless test that uses sound waves to create images of your heart. It provides your doctor with information about the size and shape of your heart and how well your heart's chambers and valves are working. This procedure takes approximately one hour. There are no restrictions for this procedure. Please do NOT wear cologne, perfume, aftershave, or lotions (deodorant is allowed). Please arrive 15 minutes prior to your appointment time.   Follow-Up: At [facility], you and your health needs are our priority.  As part of our continuing mission to provide you with exceptional heart care, we have created designated Provider Care Teams.  These Care Teams include your primary Cardiologist (physician) and Advanced Practice Providers (APPs -  Physician Assistants and Nurse Practitioners) who all work together to provide you with the care you need, when you need it.  We recommend signing up for the patient portal called "MyChart".  Sign up information is provided on this After Visit Summary.  MyChart is used to connect with patients for Virtual Visits (Telemedicine).  Patients are able to view lab/test results, encounter notes, upcoming appointments, etc.  Non-urgent messages can be sent to your provider as well.   To learn more about what you can do with MyChart, go to [email or web address].    Your next  appointment:   1 month(s)  Provider:   Robin Searing, NP      Other Instructions

## 2022-08-23 LAB — CBC
Hematocrit: 37.2 % (ref 34.0–46.6)
Hemoglobin: 12 g/dL (ref 11.1–15.9)
MCH: 31.2 pg (ref 26.6–33.0)
MCHC: 32.3 g/dL (ref 31.5–35.7)
MCV: 97 fL (ref 79–97)
Platelets: 353 10*3/uL (ref 150–450)
RBC: 3.85 x10E6/uL (ref 3.77–5.28)
RDW: 12.5 % (ref 11.7–15.4)
WBC: 9.9 10*3/uL (ref 3.4–10.8)

## 2022-08-23 LAB — BASIC METABOLIC PANEL
BUN/Creatinine Ratio: 17 (ref 12–28)
BUN: 26 mg/dL (ref 8–27)
CO2: 20 mmol/L (ref 20–29)
Calcium: 9.7 mg/dL (ref 8.7–10.3)
Chloride: 105 mmol/L (ref 96–106)
Creatinine, Ser: 1.51 mg/dL — ABNORMAL HIGH (ref 0.57–1.00)
Glucose: 100 mg/dL — ABNORMAL HIGH (ref 70–99)
Potassium: 4.4 mmol/L (ref 3.5–5.2)
Sodium: 145 mmol/L — ABNORMAL HIGH (ref 134–144)
eGFR: 36 mL/min/{1.73_m2} — ABNORMAL LOW (ref >59)

## 2022-08-25 ENCOUNTER — Telehealth: Payer: Self-pay | Admitting: Cardiology

## 2022-08-25 NOTE — Telephone Encounter (Signed)
Left message for pt to call back to discuss her concerns. 

## 2022-08-25 NOTE — Telephone Encounter (Signed)
Pt would like a callback after noticing she has a bruise on her thigh and she's feeling a knot under it. She's very concerned and worried about it. Please advise.

## 2022-08-29 ENCOUNTER — Telehealth (HOSPITAL_COMMUNITY): Payer: Self-pay

## 2022-08-29 NOTE — Telephone Encounter (Signed)
Pt insurance is active and benefits verified through [facility]. Co-pay $0.00, DED $0.00/$0.00 met, out of pocket $3,200.00/$321.32 met, co-insurance 10%. No pre-authorization required. Wilber Oliphant Q./[employer], 08/29/22 @ 4:08PM, ZOX#[id number]   How many CR sessions are covered? (36 sessions/visits for TCR, 72 sessions/visits for ICR)72 visits Is this a lifetime maximum or an annual maximum? Annual Has the member used any of these services to date? No Is there a time limit (weeks/months) on start of program and/or program completion? No     Will contact patient to see if she is interested in the Cardiac Rehab Program.

## 2022-08-29 NOTE — Telephone Encounter (Signed)
Called patient to see if she was interested in participating in the Cardiac Rehab Program. Patient stated yes. Patient will come in for orientation on 09/05/2022 @ 10:30AM and will attend the 1:45PM exercise class. Went over insurance, patient verbalized understanding.   Pensions consultant.

## 2022-08-31 ENCOUNTER — Telehealth: Payer: Self-pay | Admitting: Nurse Practitioner

## 2022-08-31 NOTE — Telephone Encounter (Signed)
Left message for patient to call back  

## 2022-08-31 NOTE — Telephone Encounter (Signed)
Pt calling for lab results.

## 2022-08-31 NOTE — Telephone Encounter (Signed)
Gaston Islam., NP 08/23/2022  7:21 AM EDT     Please let patient know that her renal function is elevated and sodium is also increased indicating possible dehydration.  Please make sure to abstain from NSAIDs and use Tylenol for chest pain or discomfort.   Plan: -Please make sure to maintain good hydration and follow-up with your nephrologist and continue current medications as prescribed.   Robin Searing, NP

## 2022-09-04 ENCOUNTER — Other Ambulatory Visit (HOSPITAL_COMMUNITY): Payer: Self-pay

## 2022-09-04 NOTE — Telephone Encounter (Signed)
  Pt is returning call, pt said, to leave her a detailed message if she unable to answer call

## 2022-09-04 NOTE — Telephone Encounter (Signed)
Left detailed message for the patient, per Alden Server NP recommendations:   Please let patient know that her renal function is elevated and sodium is also increased indicating possible dehydration.  Please make sure to abstain from NSAIDs and use Tylenol for chest pain or discomfort.   Plan: -Please make sure to maintain good hydration and follow-up with your nephrologist and continue current medications as prescribed.   Robin Searing, NP

## 2022-09-04 NOTE — Telephone Encounter (Signed)
Pt has not called back to discuss bruising as reported 4/26.  Will close this encounter and await a call back if she has continued concerns.

## 2022-09-05 ENCOUNTER — Ambulatory Visit: Payer: Medicare Other | Admitting: Neurology

## 2022-09-05 ENCOUNTER — Encounter (HOSPITAL_COMMUNITY)
Admission: RE | Admit: 2022-09-05 | Discharge: 2022-09-05 | Disposition: A | Discharge status: Home / Self Care | Payer: Medicare Other | Source: Ambulatory Visit | Attending: Cardiology | Admitting: Cardiology

## 2022-09-05 ENCOUNTER — Encounter (HOSPITAL_COMMUNITY): Payer: Self-pay

## 2022-09-05 VITALS — BP 144/80 | HR 68 | Ht 62.5 in | Wt 147.7 lb

## 2022-09-05 DIAGNOSIS — I2111 ST elevation (STEMI) myocardial infarction involving right coronary artery: Secondary | ICD-10-CM | POA: Diagnosis present

## 2022-09-05 DIAGNOSIS — Z955 Presence of coronary angioplasty implant and graft: Secondary | ICD-10-CM | POA: Diagnosis present

## 2022-09-05 HISTORY — DX: Hyperlipidemia, unspecified: E78.5

## 2022-09-05 NOTE — Progress Notes (Signed)
Cardiac Individual Treatment Plan  Patient Details  Name: JOYICE FUDA MRN: [id number] Date of Birth: 1951/03/18 Referring Provider:   Flowsheet Row INTENSIVE CARDIAC REHAB ORIENT from 09/05/2022 in [facility]  Referring Provider Donato Schultz, MD       Initial Encounter Date:  Flowsheet Row INTENSIVE CARDIAC REHAB ORIENT from 09/05/2022 in [facility]  Date 09/05/22       Visit Diagnosis: 08/10/22 STEMI  08/10/22 S/P DES RCA x2 shockwave lithotripsy  Patient's Home Medications on Admission:  Current Outpatient Medications:    albuterol (PROAIR HFA) 108 (90 Base) MCG/ACT inhaler, Inhale 2 puffs into the lungs every 4 (four) hours as needed for wheezing or shortness of breath., Disp: 18 g, Rfl: 2   albuterol (PROVENTIL) (2.5 MG/3ML) 0.083% nebulizer solution, Take 3 mLs (2.5 mg total) by nebulization every 4 (four) hours as needed for wheezing or shortness of breath., Disp: 75 mL, Rfl: 12   ALPRAZolam (XANAX) 0.5 MG tablet, Take 1 tablet (0.5 mg total) by mouth 2 (two) times daily as needed for anxiety., Disp: 30 tablet, Rfl: 0   aspirin 81 MG chewable tablet, Chew 1 tablet (81 mg total) by mouth daily., Disp: 90 tablet, Rfl: 2   atorvastatin (LIPITOR) 80 MG tablet, Take 1 tablet (80 mg total) by mouth daily., Disp: 90 tablet, Rfl: 1   Biotin 5000 MCG CAPS, Take 5,000 mcg by mouth daily., Disp: , Rfl:    budesonide (PULMICORT) 0.5 MG/2ML nebulizer solution, USE 1 VIAL IN NEBULIZER IN THE MORNING AND AT BEDTIME (Patient taking differently: Take 0.5 mg by nebulization daily as needed. USE 1 VIAL IN NEBULIZER IN THE MORNING AND AT BEDTIME), Disp: 120 mL, Rfl: 11   clopidogrel (PLAVIX) 75 MG tablet, Take 1 tablet (75 mg total) by mouth daily., Disp: 90 tablet, Rfl: 2   colchicine 0.6 MG tablet, Take 1 tablet (0.6 mg total) by mouth daily., Disp: 90 tablet, Rfl: 0   divalproex  (DEPAKOTE ER) 500 MG 24 hr tablet, Take 1 tablet (500 mg total) by mouth at bedtime., Disp: 90 tablet, Rfl: 3   empagliflozin ([name]) 10 MG TABS tablet, Take 1 tablet (10 mg total) by mouth daily., Disp: 30 tablet, Rfl: 2   ferrous sulfate 325 (65 FE) MG tablet, Take 325 mg by mouth daily with breakfast., Disp: , Rfl:    formoterol (PERFOROMIST) 20 MCG/2ML nebulizer solution, USE 1 VIAL VIA NEBULIZER TWICE  DAILY IN THE MORNING AND EVENING (Patient taking differently: 20 mcg 2 (two) times daily. USE 1 VIAL VIA NEBULIZER TWICE  DAILY IN THE MORNING AND EVENING as needed), Disp: 360 mL, Rfl: 3   isosorbide mononitrate (IMDUR) 30 MG 24 hr tablet, Take 1 tablet (30 mg total) by mouth daily., Disp: 90 tablet, Rfl: 1   latanoprost (XALATAN) 0.005 % ophthalmic solution, Place 1 drop into both eyes at bedtime., Disp: , Rfl:    losartan (COZAAR) 25 MG tablet, Take 1 tablet (25 mg total) by mouth daily., Disp: 90 tablet, Rfl: 0   magnesium oxide (MAG-OX) 400 (240 Mg) MG tablet, Take 1 tablet (400 mg total) by mouth daily., Disp: 60 tablet, Rfl: 0   memantine (NAMENDA) 10 MG tablet, Take 1 tablet (10 mg total) by mouth 2 (two) times daily., Disp: 180 tablet, Rfl: 3   metFORMIN (GLUCOPHAGE) 1000 MG tablet, Take 1,000 mg by mouth 2 (two) times daily with a  meal., Disp: , Rfl:    montelukast (SINGULAIR) 10 MG tablet, TAKE 1 TABLET BY MOUTH AT  BEDTIME, Disp: 90 tablet, Rfl: 3   Multiple Vitamins-Minerals (ONE-A-DAY WOMENS PO), Take 1 tablet by mouth daily with breakfast., Disp: , Rfl:    nitroGLYCERIN (NITROSTAT) 0.4 MG SL tablet, Place 1 tablet (0.4 mg total) under the tongue every 5 (five) minutes as needed for chest pain. Don't exceed more than 3 doses in 15 mins, Disp: 30 tablet, Rfl: 0   pantoprazole (PROTONIX) 40 MG tablet, Take 1 tablet (40 mg total) by mouth 2 (two) times daily before a meal., Disp: 60 tablet, Rfl: 0   Probiotic Product (EQ PROBIOTIC) CAPS, Take 1 capsule by mouth daily., Disp: , Rfl:    Past Medical History: Past Medical History:  Diagnosis Date   Anxiety    Arthritis    Asthma    Chronic diastolic CHF (congestive heart failure) (HCC) 07/17/2016   Coronary artery disease    a. NSTEMI 12/2015 - LHC 01/18/16: s/p overlapping DESx2 to RCA, 10% ost-prox Cx,10% mLAD.   Dementia (HCC)    Depression    Diabetes mellitus    Dyslipidemia    GERD (gastroesophageal reflux disease)    History of seizure    Hyperlipidemia    Hypertension    Hypertensive heart disease    NSTEMI (non-ST elevated myocardial infarction) (HCC) 12/2015   Stage 3 chronic kidney disease (HCC)     Tobacco Use: Social History   Tobacco Use  Smoking Status Former   Packs/day: 1.00   Years: 30.00   Additional pack years: 0.00   Total pack years: 30.00   Types: Cigarettes   Quit date: 01/09/2004   Years since quitting: 18.6  Smokeless Tobacco Never    Labs: Review Flowsheet  More data exists      Latest Ref Rng & Units 08/14/2021 08/20/2021 03/31/2022 08/10/2022 08/12/2022  Labs for ITP Cardiac and Pulmonary Rehab  Cholestrol 0 - 200 mg/dL - - - - 161   LDL (calc) 0 - 99 mg/dL - - - - 47   HDL-C >09 mg/dL - - - - 50   Trlycerides <150 mg/dL - - - - 604   Hemoglobin A1c 4.8 - 5.6 % - - - 6.3  -  PH, Arterial 7.35 - 7.45 - - - 7.376  -  PCO2 arterial 32 - 48 mmHg - - - 35.7  -  Bicarbonate 20.0 - 28.0 mmol/L 26.6  25.4  - 21.0  -  TCO2 22 - 32 mmol/L - - 23  22  -  Acid-base deficit 0.0 - 2.0 mmol/L - - - 4.0  -  O2 Saturation % 96.3  99.5  - 100  -    Capillary Blood Glucose: Lab Results  Component Value Date   GLUCAP 163 (H) 08/14/2022   GLUCAP 90 08/14/2022   GLUCAP 127 (H) 08/13/2022   GLUCAP 149 (H) 08/13/2022   GLUCAP 125 (H) 08/13/2022     Exercise Target Goals: Exercise Program Goal: Individual exercise prescription set using results from initial 6 min walk test and THRR while considering  patient's activity barriers and safety.   Exercise Prescription Goal: Initial  exercise prescription builds to 30-45 minutes a day of aerobic activity, 2-3 days per week.  Home exercise guidelines will be given to patient during program as part of exercise prescription that the participant will acknowledge.  Activity Barriers & Risk Stratification:  Activity Barriers & Cardiac Risk Stratification - 09/05/22  1425       Activity Barriers & Cardiac Risk Stratification   Activity Barriers Arthritis;Joint Problems;Deconditioning;Balance Concerns    Cardiac Risk Stratification High             6 Minute Walk:  6 Minute Walk     Row Name 09/05/22 1145         6 Minute Walk   Phase Initial     Distance 1227 feet     Walk Time 6 minutes     # of Rest Breaks 0     MPH 2.32     METS 2.6     RPE 9     Perceived Dyspnea  0     VO2 Peak 9.11     Comments 4/10 bilateral foot pain     Resting HR 72 bpm     Resting BP 144/80     Resting Oxygen Saturation  100 %     Exercise Oxygen Saturation  during 6 min walk 100 %     Max Ex. HR 93 bpm     Max Ex. BP 146/80     2 Minute Post BP 136/82              Oxygen Initial Assessment:   Oxygen Re-Evaluation:   Oxygen Discharge (Final Oxygen Re-Evaluation):   Initial Exercise Prescription:  Initial Exercise Prescription - 09/05/22 1400       Date of Initial Exercise RX and Referring Provider   Date 09/05/22    Referring Provider Donato Schultz, MD    Expected Discharge Date 11/17/22      Recumbant Bike   Level 1.5    RPM 60    Watts 25    Minutes 15    METs 2.6      NuStep   Level 2    SPM 75    Minutes 15    METs 2.6      Prescription Details   Frequency (times per week) 3    Duration Progress to 30 minutes of continuous aerobic without signs/symptoms of physical distress      Intensity   THRR 40-80% of Max Heartrate 59-118    Ratings of Perceived Exertion 11-13    Perceived Dyspnea 0-4      Progression   Progression Continue progressive overload as per policy without signs/symptoms or  physical distress.      Resistance Training   Training Prescription Yes    Weight 2 lbs    Reps 10-15             Perform Capillary Blood Glucose checks as needed.  Exercise Prescription Changes:   Exercise Comments:   Exercise Goals and Review:   Exercise Goals     Row Name 09/05/22 1426             Exercise Goals   Increase Physical Activity Yes       Intervention Provide advice, education, support and counseling about physical activity/exercise needs.;Develop an individualized exercise prescription for aerobic and resistive training based on initial evaluation findings, risk stratification, comorbidities and participant's personal goals.       Expected Outcomes Short Term: Attend rehab on a regular basis to increase amount of physical activity.;Long Term: Add in home exercise to make exercise part of routine and to increase amount of physical activity.;Long Term: Exercising regularly at least 3-5 days a week.       Increase Strength and Stamina Yes       Intervention  Provide advice, education, support and counseling about physical activity/exercise needs.;Develop an individualized exercise prescription for aerobic and resistive training based on initial evaluation findings, risk stratification, comorbidities and participant's personal goals.       Expected Outcomes Short Term: Increase workloads from initial exercise prescription for resistance, speed, and METs.;Short Term: Perform resistance training exercises routinely during rehab and add in resistance training at home;Long Term: Improve cardiorespiratory fitness, muscular endurance and strength as measured by increased METs and functional capacity ( )       Able to understand and use rate of perceived exertion (RPE) scale Yes       Intervention Provide education and explanation on how to use RPE scale       Expected Outcomes Short Term: Able to use RPE daily in rehab to express subjective intensity level;Long Term:   Able to use RPE to guide intensity level when exercising independently       Knowledge and understanding of Target Heart Rate Range (THRR) Yes       Intervention Provide education and explanation of THRR including how the numbers were predicted and where they are located for reference       Expected Outcomes Short Term: Able to state/look up THRR;Short Term: Able to use daily as guideline for intensity in rehab;Long Term: Able to use THRR to govern intensity when exercising independently       Understanding of Exercise Prescription Yes       Intervention Provide education, explanation, and written materials on patient's individual exercise prescription       Expected Outcomes Short Term: Able to explain program exercise prescription;Long Term: Able to explain home exercise prescription to exercise independently                Exercise Goals Re-Evaluation :   Discharge Exercise Prescription (Final Exercise Prescription Changes):   Nutrition:  Target Goals: Understanding of nutrition guidelines, daily intake of sodium 1500mg , cholesterol 200mg , calories 30% from fat and 7% or less from saturated fats, daily to have 5 or more servings of fruits and vegetables.  Biometrics:  Pre Biometrics - 09/05/22 1146       Pre Biometrics   Waist Circumference 27.75 inches    Hip Circumference 38.5 inches    Waist to Hip Ratio 0.72 %    Triceps Skinfold 42 mm    % Body Fat 38.6 %    Grip Strength 79 kg    Flexibility 14.75 in    Single Leg Stand 6.87 seconds              Nutrition Therapy Plan and Nutrition Goals:   Nutrition Assessments:  MEDIFICTS Score Key: ?70 Need to make dietary changes  40-70 Heart Healthy Diet ? 40 Therapeutic Level Cholesterol Diet    Picture Your Plate Scores: <16 Unhealthy dietary pattern with much room for improvement. 41-50 Dietary pattern unlikely to meet recommendations for good health and room for improvement. 51-60 More healthful dietary  pattern, with some room for improvement.  >60 Healthy dietary pattern, although there may be some specific behaviors that could be improved.    Nutrition Goals Re-Evaluation:   Nutrition Goals Re-Evaluation:   Nutrition Goals Discharge (Final Nutrition Goals Re-Evaluation):   Psychosocial: Target Goals: Acknowledge presence or absence of significant depression and/or stress, maximize coping skills, provide positive support system. Participant is able to verbalize types and ability to use techniques and skills needed for reducing stress and depression.  Initial Review & Psychosocial Screening:  Initial Psych  Review & Screening - 09/05/22 1153       Initial Review   Current issues with History of Depression;Current Stress Concerns;Current Depression;Current Sleep Concerns    Source of Stress Concerns Chronic Illness;Family    Comments Camery says she is experiencing some depression as her husband has cancer. Darlin has anxiety that she takes medication for. Nyomii says she is in the process of receiving counseling , which has not been set up yet.      Family Dynamics   Good Support System? Yes   Janean has her husband, 3 children, and daughter in law for support     Barriers   Psychosocial barriers to participate in program The patient should benefit from training in stress management and relaxation.;There are no identifiable barriers or psychosocial needs.      Screening Interventions   Interventions Encouraged to exercise;Provide feedback about the scores to participant    Expected Outcomes Long Term Goal: Stressors or current issues are controlled or eliminated.;Short Term goal: Identification and review with participant of any Quality of Life or Depression concerns found by scoring the questionnaire.;Long Term goal: The participant improves quality of Life and PHQ9 Scores as seen by post scores and/or verbalization of changes             Quality of Life Scores:  Quality of  Life - 09/05/22 1432       Quality of Life   Select Quality of Life      Quality of Life Scores   Health/Function Pre 19.29 %    Socioeconomic Pre 23 %    Psych/Spiritual Pre 23.14 %    Family Pre 28.8 %    GLOBAL Pre 22.31 %            Scores of 19 and below usually indicate a poorer quality of life in these areas.  A difference of  2-3 points is a clinically meaningful difference.  A difference of 2-3 points in the total score of the Quality of Life Index has been associated with significant improvement in overall quality of life, self-image, physical symptoms, and general health in studies assessing change in quality of life.  PHQ-9: Review Flowsheet       09/05/2022  Depression screen PHQ 2/9  Decreased Interest 1  Down, Depressed, Hopeless 2  PHQ - 2 Score 3  Altered sleeping 3  Tired, decreased energy 2  Change in appetite 2  Feeling bad or failure about yourself  1  Trouble concentrating 1  Moving slowly or fidgety/restless 1  Suicidal thoughts 0  PHQ-9 Score 13  Difficult doing work/chores Somewhat difficult   Interpretation of Total Score  Total Score Depression Severity:  1-4 = Minimal depression, 5-9 = Mild depression, 10-14 = Moderate depression, 15-19 = Moderately severe depression, 20-27 = Severe depression   Psychosocial Evaluation and Intervention:   Psychosocial Re-Evaluation:   Psychosocial Discharge (Final Psychosocial Re-Evaluation):   Vocational Rehabilitation: Provide vocational rehab assistance to qualifying candidates.   Vocational Rehab Evaluation & Intervention:  Vocational Rehab - 09/05/22 1203       Initial Vocational Rehab Evaluation & Intervention   Assessment shows need for Vocational Rehabilitation No   Jaidyn is retired and does not need vocational rehab at this time            Education: Education Goals: Education classes will be provided on a weekly basis, covering required topics. Participant will state  understanding/return demonstration of topics presented.     Core Videos:  Exercise    Move It!  Clinical staff conducted group or individual video education with verbal and written material and guidebook.  Patient learns the recommended Pritikin exercise program. Exercise with the goal of living a long, healthy life. Some of the health benefits of exercise include controlled diabetes, healthier blood pressure levels, improved cholesterol levels, improved heart and lung capacity, improved sleep, and better body composition. Everyone should speak with their doctor before starting or changing an exercise routine.  Biomechanical Limitations Clinical staff conducted group or individual video education with verbal and written material and guidebook.  Patient learns how biomechanical limitations can impact exercise and how we can mitigate and possibly overcome limitations to have an impactful and balanced exercise routine.  Body Composition Clinical staff conducted group or individual video education with verbal and written material and guidebook.  Patient learns that body composition (ratio of muscle mass to fat mass) is a key component to assessing overall fitness, rather than body weight alone. Increased fat mass, especially visceral belly fat, can put [place] at increased risk for metabolic syndrome, type 2 diabetes, heart disease, and even death. It is recommended to combine diet and exercise (cardiovascular and resistance training) to improve your body composition. Seek guidance from your physician and exercise physiologist before implementing an exercise routine.  Exercise Action Plan Clinical staff conducted group or individual video education with verbal and written material and guidebook.  Patient learns the recommended strategies to achieve and enjoy long-term exercise adherence, including variety, self-motivation, self-efficacy, and positive decision making. Benefits of exercise include fitness,  good health, weight management, more energy, better sleep, less stress, and overall well-being.  Medical   Heart Disease Risk Reduction Clinical staff conducted group or individual video education with verbal and written material and guidebook.  Patient learns our heart is our most vital organ as it circulates oxygen, nutrients, white blood cells, and hormones throughout the entire body, and carries waste away. Data supports a plant-based eating plan like the Pritikin Program for its effectiveness in slowing progression of and reversing heart disease. The video provides a number of recommendations to address heart disease.   Metabolic Syndrome and Belly Fat  Clinical staff conducted group or individual video education with verbal and written material and guidebook.  Patient learns what metabolic syndrome is, how it leads to heart disease, and how one can reverse it and keep it from coming back. You have metabolic syndrome if you have 3 of the following 5 criteria: abdominal obesity, high blood pressure, high triglycerides, low HDL cholesterol, and high blood sugar.  Hypertension and Heart Disease Clinical staff conducted group or individual video education with verbal and written material and guidebook.  Patient learns that high blood pressure, or hypertension, is very common in the [place]. Hypertension is largely due to excessive salt intake, but other important risk factors include being overweight, physical inactivity, drinking too much alcohol, smoking, and not eating enough potassium from fruits and vegetables. High blood pressure is a leading risk factor for heart attack, stroke, congestive heart failure, dementia, kidney failure, and premature death. Long-term effects of excessive salt intake include stiffening of the arteries and thickening of heart muscle and organ damage. Recommendations include ways to reduce hypertension and the risk of heart disease.  Diseases of Our Time -  Focusing on Diabetes Clinical staff conducted group or individual video education with verbal and written material and guidebook.  Patient learns why the best way to stop diseases of our time is prevention,  through food and other lifestyle changes. Medicine (such as prescription pills and surgeries) is often only a Band-Aid on the problem, not a long-term solution. Most common diseases of our time include obesity, type 2 diabetes, hypertension, heart disease, and cancer. The Pritikin Program is recommended and has been proven to help reduce, reverse, and/or prevent the damaging effects of metabolic syndrome.  Nutrition   Overview of the Pritikin Eating Plan  Clinical staff conducted group or individual video education with verbal and written material and guidebook.  Patient learns about the Pritikin Eating Plan for disease risk reduction. The Pritikin Eating Plan emphasizes a wide variety of unrefined, minimally-processed carbohydrates, like fruits, vegetables, whole grains, and legumes. Go, Caution, and Stop food choices are explained. Plant-based and lean animal proteins are emphasized. Rationale provided for low sodium intake for blood pressure control, low added sugars for blood sugar stabilization, and low added fats and oils for coronary artery disease risk reduction and weight management.  Calorie Density  Clinical staff conducted group or individual video education with verbal and written material and guidebook.  Patient learns about calorie density and how it impacts the Pritikin Eating Plan. Knowing the characteristics of the food you choose will help you decide whether those foods will lead to weight gain or weight loss, and whether you want to consume more or less of them. Weight loss is usually a side effect of the Pritikin Eating Plan because of its focus on low calorie-dense foods.  Label Reading  Clinical staff conducted group or individual video education with verbal and written  material and guidebook.  Patient learns about the Pritikin recommended label reading guidelines and corresponding recommendations regarding calorie density, added sugars, sodium content, and whole grains.  Dining Out - Part 1  Clinical staff conducted group or individual video education with verbal and written material and guidebook.  Patient learns that restaurant meals can be sabotaging because they can be so high in calories, fat, sodium, and/or sugar. Patient learns recommended strategies on how to positively address this and avoid unhealthy pitfalls.  Facts on Fats  Clinical staff conducted group or individual video education with verbal and written material and guidebook.  Patient learns that lifestyle modifications can be just as effective, if not more so, as many medications for lowering your risk of heart disease. A Pritikin lifestyle can help to reduce your risk of inflammation and atherosclerosis (cholesterol build-up, or plaque, in the artery walls). Lifestyle interventions such as dietary choices and physical activity address the cause of atherosclerosis. A review of the types of fats and their impact on blood cholesterol levels, along with dietary recommendations to reduce fat intake is also included.  Nutrition Action Plan  Clinical staff conducted group or individual video education with verbal and written material and guidebook.  Patient learns how to incorporate Pritikin recommendations into their lifestyle. Recommendations include planning and keeping personal health goals in mind as an important part of their success.  Healthy Mind-Set    Healthy Minds, Bodies, Hearts  Clinical staff conducted group or individual video education with verbal and written material and guidebook.  Patient learns how to identify when they are stressed. Video will discuss the impact of that stress, as well as the many benefits of stress management. Patient will also be introduced to stress management  techniques. The way we think, act, and feel has an impact on our hearts.  How Our Thoughts Can Heal Our Hearts  Clinical staff conducted group or individual video education with  verbal and written material and guidebook.  Patient learns that negative thoughts can cause depression and anxiety. This can result in negative lifestyle behavior and serious health problems. Cognitive behavioral therapy is an effective method to help control our thoughts in order to change and improve our emotional outlook.  Additional Videos:  Exercise    Improving Performance  Clinical staff conducted group or individual video education with verbal and written material and guidebook.  Patient learns to use a non-linear approach by alternating intensity levels and lengths of time spent exercising to help burn more calories and lose more body fat. Cardiovascular exercise helps improve heart health, metabolism, hormonal balance, blood sugar control, and recovery from fatigue. Resistance training improves strength, endurance, balance, coordination, reaction time, metabolism, and muscle mass. Flexibility exercise improves circulation, posture, and balance. Seek guidance from your physician and exercise physiologist before implementing an exercise routine and learn your capabilities and proper form for all exercise.  Introduction to Yoga  Clinical staff conducted group or individual video education with verbal and written material and guidebook.  Patient learns about yoga, a discipline of the coming together of mind, breath, and body. The benefits of yoga include improved flexibility, improved range of motion, better posture and core strength, increased lung function, weight loss, and positive self-image. Yoga's heart health benefits include lowered blood pressure, healthier heart rate, decreased cholesterol and triglyceride levels, improved immune function, and reduced stress. Seek guidance from your physician and exercise  physiologist before implementing an exercise routine and learn your capabilities and proper form for all exercise.  Medical   Aging: Enhancing Your Quality of Life  Clinical staff conducted group or individual video education with verbal and written material and guidebook.  Patient learns key strategies and recommendations to stay in good physical health and enhance quality of life, such as prevention strategies, having an advocate, securing a Health Care Proxy and Power of Attorney, and keeping a list of medications and system for tracking them. It also discusses how to avoid risk for bone loss.  Biology of Weight Control  Clinical staff conducted group or individual video education with verbal and written material and guidebook.  Patient learns that weight gain occurs because we consume more calories than we burn (eating more, moving less). Even if your body weight is normal, you may have higher ratios of fat compared to muscle mass. Too much body fat puts you at increased risk for cardiovascular disease, heart attack, stroke, type 2 diabetes, and obesity-related cancers. In addition to exercise, following the Pritikin Eating Plan can help reduce your risk.  Decoding Lab Results  Clinical staff conducted group or individual video education with verbal and written material and guidebook.  Patient learns that lab test reflects one measurement whose values change over time and are influenced by many factors, including medication, stress, sleep, exercise, food, hydration, pre-existing medical conditions, and more. It is recommended to use the knowledge from this video to become more involved with your lab results and evaluate your numbers to speak with your doctor.   Diseases of Our Time - Overview  Clinical staff conducted group or individual video education with verbal and written material and guidebook.  Patient learns that according to the CDC, 50% to 70% of chronic diseases (such as obesity,  type 2 diabetes, elevated lipids, hypertension, and heart disease) are avoidable through lifestyle improvements including healthier food choices, listening to satiety cues, and increased physical activity.  Sleep Disorders Clinical staff conducted group or individual video education  with verbal and written material and guidebook.  Patient learns how good quality and duration of sleep are important to overall health and well-being. Patient also learns about sleep disorders and how they impact health along with recommendations to address them, including discussing with a physician.  Nutrition  Dining Out - Part 2 Clinical staff conducted group or individual video education with verbal and written material and guidebook.  Patient learns how to plan ahead and communicate in order to maximize their dining experience in a healthy and nutritious manner. Included are recommended food choices based on the type of restaurant the patient is visiting.   Fueling a Banker conducted group or individual video education with verbal and written material and guidebook.  There is a strong connection between our food choices and our health. Diseases like obesity and type 2 diabetes are very prevalent and are in large-part due to lifestyle choices. The Pritikin Eating Plan provides plenty of food and hunger-curbing satisfaction. It is easy to follow, affordable, and helps reduce health risks.  Menu Workshop  Clinical staff conducted group or individual video education with verbal and written material and guidebook.  Patient learns that restaurant meals can sabotage health goals because they are often packed with calories, fat, sodium, and sugar. Recommendations include strategies to plan ahead and to communicate with the manager, chef, or server to help order a healthier meal.  Planning Your Eating Strategy  Clinical staff conducted group or individual video education with verbal and written  material and guidebook.  Patient learns about the Pritikin Eating Plan and its benefit of reducing the risk of disease. The Pritikin Eating Plan does not focus on calories. Instead, it emphasizes high-quality, nutrient-rich foods. By knowing the characteristics of the foods, we choose, we can determine their calorie density and make informed decisions.  Targeting Your Nutrition Priorities  Clinical staff conducted group or individual video education with verbal and written material and guidebook.  Patient learns that lifestyle habits have a tremendous impact on disease risk and progression. This video provides eating and physical activity recommendations based on your personal health goals, such as reducing LDL cholesterol, losing weight, preventing or controlling type 2 diabetes, and reducing high blood pressure.  Vitamins and Minerals  Clinical staff conducted group or individual video education with verbal and written material and guidebook.  Patient learns different ways to obtain key vitamins and minerals, including through a recommended healthy diet. It is important to discuss all supplements you take with your doctor.   Healthy Mind-Set    Smoking Cessation  Clinical staff conducted group or individual video education with verbal and written material and guidebook.  Patient learns that cigarette smoking and tobacco addiction pose a serious health risk which affects millions of people. Stopping smoking will significantly reduce the risk of heart disease, lung disease, and many forms of cancer. Recommended strategies for quitting are covered, including working with your doctor to develop a successful plan.  Culinary   Becoming a Set designer conducted group or individual video education with verbal and written material and guidebook.  Patient learns that cooking at home can be healthy, cost-effective, quick, and puts them in control. Keys to cooking healthy recipes will  include looking at your recipe, assessing your equipment needs, planning ahead, making it simple, choosing cost-effective seasonal ingredients, and limiting the use of added fats, salts, and sugars.  Cooking - Breakfast and Snacks  Clinical staff conducted group or individual video education  with verbal and written material and guidebook.  Patient learns how important breakfast is to satiety and nutrition through the entire day. Recommendations include key foods to eat during breakfast to help stabilize blood sugar levels and to prevent overeating at meals later in the day. Planning ahead is also a key component.  Cooking - Educational psychologist conducted group or individual video education with verbal and written material and guidebook.  Patient learns eating strategies to improve overall health, including an approach to cook more at home. Recommendations include thinking of animal protein as a side on your plate rather than center stage and focusing instead on lower calorie dense options like vegetables, fruits, whole grains, and plant-based proteins, such as beans. Making sauces in large quantities to freeze for later and leaving the skin on your vegetables are also recommended to maximize your experience.  Cooking - Healthy Salads and Dressing Clinical staff conducted group or individual video education with verbal and written material and guidebook.  Patient learns that vegetables, fruits, whole grains, and legumes are the foundations of the Pritikin Eating Plan. Recommendations include how to incorporate each of these in flavorful and healthy salads, and how to create homemade salad dressings. Proper handling of ingredients is also covered. Cooking - Soups and [employer] - Soups and Desserts Clinical staff conducted group or individual video education with verbal and written material and guidebook.  Patient learns that Pritikin soups and desserts make for easy, nutritious, and  delicious snacks and meal components that are low in sodium, fat, sugar, and calorie density, while high in vitamins, minerals, and filling fiber. Recommendations include simple and healthy ideas for soups and desserts.   Overview     The Pritikin Solution Program Overview Clinical staff conducted group or individual video education with verbal and written material and guidebook.  Patient learns that the results of the Pritikin Program have been documented in more than 100 articles published in peer-reviewed journals, and the benefits include reducing risk factors for (and, in some cases, even reversing) high cholesterol, high blood pressure, type 2 diabetes, obesity, and more! An overview of the three key pillars of the Pritikin Program will be covered: eating well, doing regular exercise, and having a healthy mind-set.  WORKSHOPS  Exercise: Exercise Basics: Building Your Action Plan Clinical staff led group instruction and group discussion with PowerPoint presentation and patient guidebook. To enhance the learning environment the use of posters, models and videos may be added. At the conclusion of this workshop, patients will comprehend the difference between physical activity and exercise, as well as the benefits of incorporating both, into their routine. Patients will understand the FITT (Frequency, Intensity, Time, and Type) principle and how to use it to build an exercise action plan. In addition, safety concerns and other considerations for exercise and cardiac rehab will be addressed by the presenter. The purpose of this lesson is to promote a comprehensive and effective weekly exercise routine in order to improve patients' overall level of fitness.   Managing Heart Disease: Your Path to a Healthier Heart Clinical staff led group instruction and group discussion with PowerPoint presentation and patient guidebook. To enhance the learning environment the use of posters, models and videos  may be added.At the conclusion of this workshop, patients will understand the anatomy and physiology of the heart. Additionally, they will understand how Pritikin's three pillars impact the risk factors, the progression, and the management of heart disease.  The purpose of this lesson  is to provide a high-level overview of the heart, heart disease, and how the Pritikin lifestyle positively impacts risk factors.  Exercise Biomechanics Clinical staff led group instruction and group discussion with PowerPoint presentation and patient guidebook. To enhance the learning environment the use of posters, models and videos may be added. Patients will learn how the structural parts of their bodies function and how these functions impact their daily activities, movement, and exercise. Patients will learn how to promote a neutral spine, learn how to manage pain, and identify ways to improve their physical movement in order to promote healthy living. The purpose of this lesson is to expose patients to common physical limitations that impact physical activity. Participants will learn practical ways to adapt and manage aches and pains, and to minimize their effect on regular exercise. Patients will learn how to maintain good posture while sitting, walking, and lifting.  Balance Training and Fall Prevention  Clinical staff led group instruction and group discussion with PowerPoint presentation and patient guidebook. To enhance the learning environment the use of posters, models and videos may be added. At the conclusion of this workshop, patients will understand the importance of their sensorimotor skills (vision, proprioception, and the vestibular system) in maintaining their ability to balance as they age. Patients will apply a variety of balancing exercises that are appropriate for their current level of function. Patients will understand the common causes for poor balance, possible solutions to these  problems, and ways to modify their physical environment in order to minimize their fall risk. The purpose of this lesson is to teach patients about the importance of maintaining balance as they age and ways to minimize their risk of falling.  WORKSHOPS   Nutrition:  Fueling a Ship broker led group instruction and group discussion with PowerPoint presentation and patient guidebook. To enhance the learning environment the use of posters, models and videos may be added. Patients will review the foundational principles of the Pritikin Eating Plan and understand what constitutes a serving size in each of the food groups. Patients will also learn Pritikin-friendly foods that are better choices when away from home and review make-ahead meal and snack options. Calorie density will be reviewed and applied to three nutrition priorities: weight maintenance, weight loss, and weight gain. The purpose of this lesson is to reinforce (in a group setting) the key concepts around what patients are recommended to eat and how to apply these guidelines when away from home by planning and selecting Pritikin-friendly options. Patients will understand how calorie density may be adjusted for different weight management goals.  Mindful Eating  Clinical staff led group instruction and group discussion with PowerPoint presentation and patient guidebook. To enhance the learning environment the use of posters, models and videos may be added. Patients will briefly review the concepts of the Pritikin Eating Plan and the importance of low-calorie dense foods. The concept of mindful eating will be introduced as well as the importance of paying attention to internal hunger signals. Triggers for non-hunger eating and techniques for dealing with triggers will be explored. The purpose of this lesson is to provide patients with the opportunity to review the basic principles of the Pritikin Eating Plan, discuss the value of  eating mindfully and how to measure internal cues of hunger and fullness using the Hunger Scale. Patients will also discuss reasons for non-hunger eating and learn strategies to use for controlling emotional eating.  Targeting Your Nutrition Priorities Clinical staff led group instruction and group  discussion with PowerPoint presentation and patient guidebook. To enhance the learning environment the use of posters, models and videos may be added. Patients will learn how to determine their genetic susceptibility to disease by reviewing their family history. Patients will gain insight into the importance of diet as part of an overall healthy lifestyle in mitigating the impact of genetics and other environmental insults. The purpose of this lesson is to provide patients with the opportunity to assess their personal nutrition priorities by looking at their family history, their own health history and current risk factors. Patients will also be able to discuss ways of prioritizing and modifying the Pritikin Eating Plan for their highest risk areas  Menu  Clinical staff led group instruction and group discussion with PowerPoint presentation and patient guidebook. To enhance the learning environment the use of posters, models and videos may be added. Using menus brought in from [employer], or printed from [employer], patients will apply the Pritikin dining out guidelines that were presented in the [employer] video. Patients will also be able to practice these guidelines in a variety of provided scenarios. The purpose of this lesson is to provide patients with the opportunity to practice hands-on learning of the Pritikin Dining Out guidelines with actual menus and practice scenarios.  Label Reading Clinical staff led group instruction and group discussion with PowerPoint presentation and patient guidebook. To enhance the learning environment the use of posters, models and videos may be  added. Patients will review and discuss the Pritikin label reading guidelines presented in Pritikin's Label Reading Educational series video. Using fool labels brought in from local grocery stores and markets, patients will apply the label reading guidelines and determine if the packaged food meet the Pritikin guidelines. The purpose of this lesson is to provide patients with the opportunity to review, discuss, and practice hands-on learning of the Pritikin Label Reading guidelines with actual packaged food labels. Cooking School  Pritikin's [employer] are designed to teach patients ways to prepare quick, simple, and affordable recipes at home. The importance of nutrition's role in chronic disease risk reduction is reflected in its emphasis in the overall Pritikin program. By learning how to prepare essential core Pritikin Eating Plan recipes, patients will increase control over what they eat; be able to customize the flavor of foods without the use of added salt, sugar, or fat; and improve the quality of the food they consume. By learning a set of core recipes which are easily assembled, quickly prepared, and affordable, patients are more likely to prepare more healthy foods at home. These workshops focus on convenient breakfasts, simple entres, side dishes, and desserts which can be prepared with minimal effort and are consistent with nutrition recommendations for cardiovascular risk reduction. Cooking [employer] are taught by a Armed forces logistics/support/administrative officer (RD) who has been trained by the [employer]. The chef or RD has a clear understanding of the importance of minimizing - if not completely eliminating - added fat, sugar, and sodium in recipes. Throughout the series of Cooking School Workshop sessions, patients will learn about healthy ingredients and efficient methods of cooking to build confidence in their capability to prepare    Cooking School weekly topics:  Adding  Flavor- Sodium-Free  Fast and Healthy Breakfasts  Powerhouse Plant-Based Proteins  Satisfying Salads and Dressings  Simple Sides and Sauces  International Cuisine-Spotlight on the [employer] Zones  Delicious Desserts  Savory Soups  [employer] - Meals in a Snap  Tasty Appetizers and Snacks  Comforting Weekend Breakfasts  One-Pot Wonders   Fast [employer] Your Pritikin Plate  WORKSHOPS   Healthy Mindset (Psychosocial):  Focused Goals, Sustainable Changes Clinical staff led group instruction and group discussion with PowerPoint presentation and patient guidebook. To enhance the learning environment the use of posters, models and videos may be added. Patients will be able to apply effective goal setting strategies to establish at least one personal goal, and then take consistent, meaningful action toward that goal. They will learn to identify common barriers to achieving personal goals and develop strategies to overcome them. Patients will also gain an understanding of how our mind-set can impact our ability to achieve goals and the importance of cultivating a positive and growth-oriented mind-set. The purpose of this lesson is to provide patients with a deeper understanding of how to set and achieve personal goals, as well as the tools and strategies needed to overcome common obstacles which may arise along the way.  From Head to Heart: The Power of a Healthy Outlook  Clinical staff led group instruction and group discussion with PowerPoint presentation and patient guidebook. To enhance the learning environment the use of posters, models and videos may be added. Patients will be able to recognize and describe the impact of emotions and mood on physical health. They will discover the importance of self-care and explore self-care practices which may work for them. Patients will also learn how to utilize the 4 C's to cultivate a healthier outlook and better  manage stress and challenges. The purpose of this lesson is to demonstrate to patients how a healthy outlook is an essential part of maintaining good health, especially as they continue their cardiac rehab journey.  Healthy Sleep for a Healthy Heart Clinical staff led group instruction and group discussion with PowerPoint presentation and patient guidebook. To enhance the learning environment the use of posters, models and videos may be added. At the conclusion of this workshop, patients will be able to demonstrate knowledge of the importance of sleep to overall health, well-being, and quality of life. They will understand the symptoms of, and treatments for, common sleep disorders. Patients will also be able to identify daytime and nighttime behaviors which impact sleep, and they will be able to apply these tools to help manage sleep-related challenges. The purpose of this lesson is to provide patients with a general overview of sleep and outline the importance of quality sleep. Patients will learn about a few of the most common sleep disorders. Patients will also be introduced to the concept of "sleep hygiene," and discover ways to self-manage certain sleeping problems through simple daily behavior changes. Finally, the workshop will motivate patients by clarifying the links between quality sleep and their goals of heart-healthy living.   Recognizing and Reducing Stress Clinical staff led group instruction and group discussion with PowerPoint presentation and patient guidebook. To enhance the learning environment the use of posters, models and videos may be added. At the conclusion of this workshop, patients will be able to understand the types of stress reactions, differentiate between acute and chronic stress, and recognize the impact that chronic stress has on their health. They will also be able to apply different coping mechanisms, such as reframing negative self-talk. Patients will have the opportunity  to practice a variety of stress management techniques, such as deep abdominal breathing, progressive muscle relaxation, and/or guided imagery.  The purpose of this lesson is to educate patients on the  role of stress in their lives and to provide healthy techniques for coping with it.  Learning Barriers/Preferences:  Learning Barriers/Preferences - 09/05/22 1432       Learning Barriers/Preferences   Learning Barriers Sight    Learning Preferences Written Material;Computer/Internet             Education Topics:  Knowledge Questionnaire Score:  Knowledge Questionnaire Score - 09/05/22 1433       Knowledge Questionnaire Score   Pre Score 19/24             Core Components/Risk Factors/Patient Goals at Admission:  Personal Goals and Risk Factors at Admission - 09/05/22 1433       Core Components/Risk Factors/Patient Goals on Admission   Diabetes Yes    Intervention Provide education about signs/symptoms and action to take for hypo/hyperglycemia.;Provide education about proper nutrition, including hydration, and aerobic/resistive exercise prescription along with prescribed medications to achieve blood glucose in normal ranges: Fasting glucose 65-99 mg/dL    Expected Outcomes Short Term: Participant verbalizes understanding of the signs/symptoms and immediate care of hyper/hypoglycemia, proper foot care and importance of medication, aerobic/resistive exercise and nutrition plan for blood glucose control.;Long Term: Attainment of HbA1C < 7%.    Hypertension Yes    Intervention Provide education on lifestyle modifcations including regular physical activity/exercise, weight management, moderate sodium restriction and increased consumption of fresh fruit, vegetables, and low fat dairy, alcohol moderation, and smoking cessation.;Monitor prescription use compliance.    Expected Outcomes Short Term: Continued assessment and intervention until BP is < 140/65mm HG in hypertensive  participants. < 130/82mm HG in hypertensive participants with diabetes, heart failure or chronic kidney disease.;Long Term: Maintenance of blood pressure at goal levels.    Lipids Yes    Intervention Provide education and support for participant on nutrition & aerobic/resistive exercise along with prescribed medications to achieve LDL 70mg , HDL >40mg .    Expected Outcomes Short Term: Participant states understanding of desired cholesterol values and is compliant with medications prescribed. Participant is following exercise prescription and nutrition guidelines.;Long Term: Cholesterol controlled with medications as prescribed, with individualized exercise RX and with personalized nutrition plan. Value goals: LDL < 70mg , HDL > 40 mg.    Stress Yes    Intervention Offer individual and/or small group education and counseling on adjustment to heart disease, stress management and health-related lifestyle change. Teach and support self-help strategies.;Refer participants experiencing significant psychosocial distress to appropriate mental health specialists for further evaluation and treatment. When possible, include family members and significant others in education/counseling sessions.    Expected Outcomes Short Term: Participant demonstrates changes in health-related behavior, relaxation and other stress management skills, ability to obtain effective social support, and compliance with psychotropic medications if prescribed.;Long Term: Emotional wellbeing is indicated by absence of clinically significant psychosocial distress or social isolation.             Core Components/Risk Factors/Patient Goals Review:    Core Components/Risk Factors/Patient Goals at Discharge (Final Review):    ITP Comments:  ITP Comments     Row Name 09/05/22 1150           ITP Comments Dr Armanda Magic MD, Medical Director, Introduction to [facility] Education Program/Intensive Cardiac Rehab                 Comments: Participant attended orientation for the cardiac rehabilitation program on  09/05/2022  to perform initial intake and exercise walk test. Patient introduced to the [facility] education and orientation packet was reviewed. Completed  6-minute walk test, measurements, initial ITP, and exercise prescription. Vital signs stable. Telemetry-normal sinus rhythm, asymptomatic.   Service time was from 10:21 to 12:40.

## 2022-09-05 NOTE — Progress Notes (Signed)
Patient here for cardiac rehab initial evaluation. Patient reports that she is continuing to have 5/10 dull mid sternal chest discomfort that is intermittent. Blood pressure 144/80 heart rate 74. Telemetry rhythm Sinus. Oxygen saturation 100% on room air. Monserratt reports that she took some tylenol this morning. Onsite provider Carlos Levering onsite provider notified about today's symptoms. Received the okay to proceed with 6 minute walk test. Will continue to monitor the patient throughout  the program.Reinaldo Helt Harlon Flor, RN,BSN 09/05/2022 11:38 AM

## 2022-09-05 NOTE — Progress Notes (Signed)
Cardiac Rehab Medication Review by a Nurse   Does the patient  feel that his/her medications are working for him/her?  yes  Has the patient been experiencing any side effects to the medications prescribed?  no  Does the patient measure his/her own blood pressure or blood glucose at home?  yes   Does the patient have any problems obtaining medications due to transportation or finances?   no  Understanding of regimen: good Understanding of indications: good Potential of compliance: good    Nurse comments: [name] is taking her medications as prescribed and has a good understanding of what her medications are for.[name] checks her blood pressures and CBG's daily.    Arta Bruce [facility] RN 09/05/2022 11:38 AM

## 2022-09-06 ENCOUNTER — Encounter: Payer: Self-pay | Admitting: Cardiology

## 2022-09-11 ENCOUNTER — Other Ambulatory Visit (HOSPITAL_COMMUNITY): Payer: Self-pay

## 2022-09-11 ENCOUNTER — Encounter (HOSPITAL_COMMUNITY)
Admission: RE | Admit: 2022-09-11 | Discharge: 2022-09-11 | Disposition: A | Discharge status: Home / Self Care | Payer: Medicare Other | Source: Ambulatory Visit | Attending: Cardiology | Admitting: Cardiology

## 2022-09-11 DIAGNOSIS — Z955 Presence of coronary angioplasty implant and graft: Secondary | ICD-10-CM

## 2022-09-11 DIAGNOSIS — I2111 ST elevation (STEMI) myocardial infarction involving right coronary artery: Secondary | ICD-10-CM

## 2022-09-11 LAB — GLUCOSE, CAPILLARY
Glucose-Capillary: 100 mg/dL — ABNORMAL HIGH (ref 70–99)
Glucose-Capillary: 121 mg/dL — ABNORMAL HIGH (ref 70–99)

## 2022-09-11 NOTE — Progress Notes (Signed)
Daily Session Note  Patient Details  Name: [name] MRN: [id number] Date of Birth: 1951-04-25 Referring Provider:   Flowsheet Row INTENSIVE CARDIAC REHAB ORIENT from 09/05/2022 in [facility]  Referring Provider Jacqueline Schultz, MD       Encounter Date: 09/11/2022  Check In:  Session Check In - 09/11/22 1347       Check-In   Supervising physician immediately available to respond to emergencies [facility] MD immediately available    Physician(s) Jacqueline Favre, PA-C    Location [facility]    Staff Present Gladstone Lighter, RN, Zachery Conch, MS, ACSM-CEP, Exercise Physiologist;David Manus Gunning, MS, ACSM-CEP, CCRP, Exercise Physiologist;Sarah Cleophas Dunker, RN, MSN;Bailey Wallace Cullens, MS, Exercise Physiologist;Jetta Dan Humphreys BS, ACSM-CEP, Exercise Physiologist;Johnny Hale Bogus, MS, Exercise Physiologist    Virtual Visit No    Medication changes reported     No    Fall or balance concerns reported    No    Tobacco Cessation No Change    Current number of cigarettes/nicotine per day     0    Warm-up and Cool-down Performed as group-led instruction    Resistance Training Performed Yes    VAD Patient? No    PAD/SET Patient? No      Pain Assessment   Currently in Pain? No/denies    Pain Score 0-No pain    Multiple Pain Sites No             Capillary Blood Glucose: Results for orders placed or performed during the hospital encounter of 09/11/22 (from the past 24 hour(s))  Glucose, capillary     Status: Abnormal   Collection Time: 09/11/22  3:06 PM  Result Value Ref Range   Glucose-Capillary 100 (H) 70 - 99 mg/dL  Glucose, capillary     Status: Abnormal   Collection Time: 09/11/22  3:55 PM  Result Value Ref Range   Glucose-Capillary 121 (H) 70 - 99 mg/dL     Exercise Prescription Changes - 09/11/22 1706       Response to Exercise   Blood Pressure (Admit) 128/70    Blood Pressure (Exercise) 170/88    Blood Pressure  (Exit) 132/68    Heart Rate (Admit) 76 bpm    Heart Rate (Exercise) 102 bpm    Heart Rate (Exit) 85 bpm    Rating of Perceived Exertion (Exercise) 12    Perceived Dyspnea (Exercise) 0    Symptoms high BP, but resolved with rest    Comments Pt first day in the [facility]    Duration Progress to 30 minutes of  aerobic without signs/symptoms of physical distress    Intensity THRR unchanged      Progression   Progression Continue to progress workloads to maintain intensity without signs/symptoms of physical distress.    Average METs 1.75      Resistance Training   Training Prescription Yes    Weight 2 lbs    Reps 10-15    Time 10 Minutes      Recumbant Bike   Level 1.5    Minutes 15    METs 1.7      NuStep   Level 2    SPM 63    Minutes 15    METs 1.8             Social History   Tobacco Use  Smoking Status Former   Packs/day: 1.00   Years: 30.00   Additional pack years: 0.00  Total pack years: 30.00   Types: Cigarettes   Quit date: 01/09/2004   Years since quitting: 18.6  Smokeless Tobacco Never    Goals Met:  Exercise tolerated well No report of concerns or symptoms today Strength training completed today  Goals Unmet:  Not Applicable  Comments: Pt started cardiac rehab today.  Pt tolerated light exercise without difficulty. Systolic BP in the 170's on the recumbent bike, Resting BP WNL will continue to monitor BP. telemetry-Sinus Rhythm, asymptomatic.  Medication list reconciled. Pt denies barriers to medicaiton compliance.  PSYCHOSOCIAL ASSESSMENT:  PHQ-13. [name] admits to experiencing some depression  due to her recent hospitalization and husband's cancer diagnosis .    Pt enjoys her house plants, board games and playing cards. Will review QOL on the upcoming week  Pt oriented to exercise equipment and routine. Jacqueline Headings RN BSN   Understanding verbalized.Jacqueline Headings RN BSN     Dr. Armanda [name] is Medical Director for Cardiac  Rehab at [facility]

## 2022-09-13 ENCOUNTER — Encounter (HOSPITAL_COMMUNITY)
Admission: RE | Admit: 2022-09-13 | Discharge: 2022-09-13 | Disposition: A | Discharge status: Home / Self Care | Payer: Medicare Other | Source: Ambulatory Visit | Attending: Cardiology | Admitting: Cardiology

## 2022-09-13 DIAGNOSIS — I2111 ST elevation (STEMI) myocardial infarction involving right coronary artery: Secondary | ICD-10-CM | POA: Diagnosis not present

## 2022-09-13 DIAGNOSIS — Z955 Presence of coronary angioplasty implant and graft: Secondary | ICD-10-CM

## 2022-09-13 LAB — GLUCOSE, CAPILLARY
Glucose-Capillary: 102 mg/dL — ABNORMAL HIGH (ref 70–99)
Glucose-Capillary: 98 mg/dL (ref 70–99)

## 2022-09-13 NOTE — Progress Notes (Signed)
QUALITY OF LIFE SCORE REVIEW  Pt completed Quality of Life survey as a participant in Cardiac Rehab.  Scores 21.0 or below are considered low.  Pt score very low in several areas Overall 22.31, Health and Function 19.29, socioeconomic 23.0, physiological and spiritual 23.14, family 28.80. Patient quality of life slightly altered by physical constraints which limits ability to perform as prior to recent cardiac illness. [name] says that her chest discomfort is getting better. [name] says she has experienced some depression due to her recent MI/ stenting and her husband's cancer diagnosis. [name] says she is interested in receiving counseling. Dr Tiburcio Pea office was called and notified. Offered emotional support and reassurance.  Will continue to monitor and intervene as necessary. [name] RN BSN

## 2022-09-15 ENCOUNTER — Encounter (HOSPITAL_COMMUNITY)
Admission: RE | Admit: 2022-09-15 | Discharge: 2022-09-15 | Disposition: A | Discharge status: Home / Self Care | Payer: Medicare Other | Source: Ambulatory Visit | Attending: Cardiology | Admitting: Cardiology

## 2022-09-15 DIAGNOSIS — I2111 ST elevation (STEMI) myocardial infarction involving right coronary artery: Secondary | ICD-10-CM

## 2022-09-15 DIAGNOSIS — Z955 Presence of coronary angioplasty implant and graft: Secondary | ICD-10-CM

## 2022-09-16 ENCOUNTER — Other Ambulatory Visit: Payer: Self-pay | Admitting: Neurology

## 2022-09-18 ENCOUNTER — Encounter (HOSPITAL_COMMUNITY)
Admission: RE | Admit: 2022-09-18 | Discharge: 2022-09-18 | Disposition: A | Discharge status: Home / Self Care | Payer: Medicare Other | Source: Ambulatory Visit | Attending: Cardiology | Admitting: Cardiology

## 2022-09-18 DIAGNOSIS — Z955 Presence of coronary angioplasty implant and graft: Secondary | ICD-10-CM

## 2022-09-18 DIAGNOSIS — I2111 ST elevation (STEMI) myocardial infarction involving right coronary artery: Secondary | ICD-10-CM

## 2022-09-19 ENCOUNTER — Ambulatory Visit (HOSPITAL_COMMUNITY): Payer: Medicare Other | Attending: Nurse Practitioner

## 2022-09-19 DIAGNOSIS — I2510 Atherosclerotic heart disease of native coronary artery without angina pectoris: Secondary | ICD-10-CM | POA: Diagnosis present

## 2022-09-19 DIAGNOSIS — E785 Hyperlipidemia, unspecified: Secondary | ICD-10-CM | POA: Diagnosis present

## 2022-09-19 DIAGNOSIS — I308 Other forms of acute pericarditis: Secondary | ICD-10-CM | POA: Insufficient documentation

## 2022-09-19 DIAGNOSIS — I10 Essential (primary) hypertension: Secondary | ICD-10-CM | POA: Diagnosis present

## 2022-09-19 DIAGNOSIS — N1831 Chronic kidney disease, stage 3a: Secondary | ICD-10-CM | POA: Diagnosis present

## 2022-09-19 LAB — ECHOCARDIOGRAM COMPLETE
Area-P 1/2: 3.9 cm2
S' Lateral: 1.5 cm

## 2022-09-20 ENCOUNTER — Encounter (HOSPITAL_COMMUNITY): Payer: Medicare Other

## 2022-09-22 ENCOUNTER — Encounter (HOSPITAL_COMMUNITY): Payer: Medicare Other

## 2022-09-23 ENCOUNTER — Other Ambulatory Visit: Payer: Self-pay

## 2022-09-23 ENCOUNTER — Encounter (HOSPITAL_COMMUNITY): Payer: Self-pay | Admitting: Pharmacy Technician

## 2022-09-23 ENCOUNTER — Emergency Department (HOSPITAL_COMMUNITY)
Admission: EM | Admit: 2022-09-23 | Discharge: 2022-09-24 | Disposition: A | Discharge status: Home / Self Care | Payer: Medicare Other | Attending: Emergency Medicine | Admitting: Emergency Medicine

## 2022-09-23 ENCOUNTER — Emergency Department (HOSPITAL_COMMUNITY): Payer: Medicare Other

## 2022-09-23 DIAGNOSIS — Z7982 Long term (current) use of aspirin: Secondary | ICD-10-CM | POA: Insufficient documentation

## 2022-09-23 DIAGNOSIS — I509 Heart failure, unspecified: Secondary | ICD-10-CM | POA: Diagnosis not present

## 2022-09-23 DIAGNOSIS — R0789 Other chest pain: Secondary | ICD-10-CM | POA: Insufficient documentation

## 2022-09-23 DIAGNOSIS — J449 Chronic obstructive pulmonary disease, unspecified: Secondary | ICD-10-CM | POA: Insufficient documentation

## 2022-09-23 DIAGNOSIS — Z7902 Long term (current) use of antithrombotics/antiplatelets: Secondary | ICD-10-CM | POA: Diagnosis not present

## 2022-09-23 DIAGNOSIS — I130 Hypertensive heart and chronic kidney disease with heart failure and stage 1 through stage 4 chronic kidney disease, or unspecified chronic kidney disease: Secondary | ICD-10-CM | POA: Insufficient documentation

## 2022-09-23 DIAGNOSIS — Z79899 Other long term (current) drug therapy: Secondary | ICD-10-CM | POA: Insufficient documentation

## 2022-09-23 DIAGNOSIS — N189 Chronic kidney disease, unspecified: Secondary | ICD-10-CM | POA: Insufficient documentation

## 2022-09-23 DIAGNOSIS — Z7984 Long term (current) use of oral hypoglycemic drugs: Secondary | ICD-10-CM | POA: Diagnosis not present

## 2022-09-23 DIAGNOSIS — E1122 Type 2 diabetes mellitus with diabetic chronic kidney disease: Secondary | ICD-10-CM | POA: Insufficient documentation

## 2022-09-23 DIAGNOSIS — R079 Chest pain, unspecified: Secondary | ICD-10-CM | POA: Diagnosis present

## 2022-09-23 LAB — CBC WITH DIFFERENTIAL/PLATELET
Abs Immature Granulocytes: 0.01 10*3/uL (ref 0.00–0.07)
Basophils Absolute: 0.1 10*3/uL (ref 0.0–0.1)
Basophils Relative: 1 %
Eosinophils Absolute: 0 10*3/uL (ref 0.0–0.5)
Eosinophils Relative: 1 %
HCT: 37 % (ref 36.0–46.0)
Hemoglobin: 11.9 g/dL — ABNORMAL LOW (ref 12.0–15.0)
Immature Granulocytes: 0 %
Lymphocytes Relative: 33 %
Lymphs Abs: 1.8 10*3/uL (ref 0.7–4.0)
MCH: 32.4 pg (ref 26.0–34.0)
MCHC: 32.2 g/dL (ref 30.0–36.0)
MCV: 100.8 fL — ABNORMAL HIGH (ref 80.0–100.0)
Monocytes Absolute: 0.6 10*3/uL (ref 0.1–1.0)
Monocytes Relative: 11 %
Neutro Abs: 3 10*3/uL (ref 1.7–7.7)
Neutrophils Relative %: 54 %
Platelets: 302 10*3/uL (ref 150–400)
RBC: 3.67 MIL/uL — ABNORMAL LOW (ref 3.87–5.11)
RDW: 13.5 % (ref 11.5–15.5)
WBC: 5.6 10*3/uL (ref 4.0–10.5)
nRBC: 0 % (ref 0.0–0.2)

## 2022-09-23 LAB — BASIC METABOLIC PANEL
Anion gap: 9 (ref 5–15)
BUN: 19 mg/dL (ref 8–23)
CO2: 22 mmol/L (ref 22–32)
Calcium: 8.9 mg/dL (ref 8.9–10.3)
Chloride: 104 mmol/L (ref 98–111)
Creatinine, Ser: 1.19 mg/dL — ABNORMAL HIGH (ref 0.44–1.00)
GFR, Estimated: 48 mL/min — ABNORMAL LOW (ref >60)
Glucose, Bld: 101 mg/dL — ABNORMAL HIGH (ref 70–99)
Potassium: 3.7 mmol/L (ref 3.5–5.1)
Sodium: 135 mmol/L (ref 135–145)

## 2022-09-23 LAB — BRAIN NATRIURETIC PEPTIDE: B Natriuretic Peptide: 16.3 pg/mL (ref 0.0–100.0)

## 2022-09-23 LAB — TROPONIN I (HIGH SENSITIVITY)
Troponin I (High Sensitivity): 5 ng/L (ref <18)
Troponin I (High Sensitivity): 5 ng/L (ref <18)

## 2022-09-23 MED ORDER — CYCLOBENZAPRINE HCL 10 MG PO TABS: 10.0000 mg | ORAL_TABLET | Freq: Two times a day (BID) | ORAL | 0 refills | Status: DC | PRN

## 2022-09-23 MED ORDER — LIDOCAINE 5 % EX PTCH
1.0000 | MEDICATED_PATCH | Freq: Once | CUTANEOUS | Status: DC
  Administered 2022-09-23: 1 via TRANSDERMAL
  Filled 2022-09-23: qty 1

## 2022-09-23 MED ORDER — ACETAMINOPHEN 500 MG PO TABS
1000.0000 mg | ORAL_TABLET | Freq: Once | ORAL | Status: AC
  Administered 2022-09-23: 1000 mg via ORAL
  Filled 2022-09-23: qty 2

## 2022-09-23 MED ORDER — ACETAMINOPHEN 500 MG PO TABS: 1000.0000 mg | ORAL_TABLET | Freq: Four times a day (QID) | ORAL | 0 refills | Status: DC | PRN

## 2022-09-23 NOTE — ED Triage Notes (Signed)
Pt bib ems with reports of L sided chest pain radiating into L shoulder and neck. 3 stents placed recently. Given 324mg  asa with ems along with 1 SL nitro without any effective. 22g R hand.

## 2022-09-23 NOTE — ED Provider Notes (Signed)
[place] EMERGENCY DEPARTMENT AT [facility] Provider Note   CSN: [id number] Arrival date & time: 09/23/22  1832     History {Add pertinent medical, surgical, social history, OB history to HPI:1} Chief Complaint  Patient presents with   Chest Pain    [name] is a 72 y.o. female.   Chest Pain      Home Medications Prior to Admission medications   Medication Sig Start Date End Date Taking? Authorizing Provider  albuterol (PROAIR HFA) 108 (90 Base) MCG/ACT inhaler Inhale 2 puffs into the lungs every 4 (four) hours as needed for wheezing or shortness of breath. 04/20/21   Parrett, Virgel Bouquet, NP  albuterol (PROVENTIL) (2.5 MG/3ML) 0.083% nebulizer solution Take 3 mLs (2.5 mg total) by nebulization every 4 (four) hours as needed for wheezing or shortness of breath. 05/17/21   Nyoka Cowden, MD  ALPRAZolam Prudy Feeler) 0.5 MG tablet Take 1 tablet (0.5 mg total) by mouth 2 (two) times daily as needed for anxiety. 07/31/15   Dorothea Ogle, MD  aspirin 81 MG chewable tablet Chew 1 tablet (81 mg total) by mouth daily. 08/15/22   Arty Baumgartner, NP  atorvastatin (LIPITOR) 80 MG tablet Take 1 tablet (80 mg total) by mouth daily. 08/15/22   Arty Baumgartner, NP  Biotin 5000 MCG CAPS Take 5,000 mcg by mouth daily.    [provider]  budesonide (PULMICORT) 0.5 MG/2ML nebulizer solution USE 1 VIAL IN NEBULIZER IN THE MORNING AND AT BEDTIME Patient taking differently: Take 0.5 mg by nebulization daily as needed. USE 1 VIAL IN NEBULIZER IN THE MORNING AND AT BEDTIME 03/29/21   Nyoka Cowden, MD  clopidogrel (PLAVIX) 75 MG tablet Take 1 tablet (75 mg total) by mouth daily. 08/15/22   Arty Baumgartner, NP  colchicine 0.6 MG tablet Take 1 tablet (0.6 mg total) by mouth daily. 08/15/22   Arty Baumgartner, NP  divalproex (DEPAKOTE ER) 500 MG 24 hr tablet TAKE 1 TABLET BY MOUTH AT  BEDTIME 09/18/22   Glean Salvo, NP  empagliflozin ([name]) 10 MG TABS tablet Take 1  tablet (10 mg total) by mouth daily. 08/15/22   Laverda Page B, NP  ferrous sulfate 325 (65 FE) MG tablet Take 325 mg by mouth daily with breakfast. 07/15/21   [provider]  formoterol (PERFOROMIST) 20 MCG/2ML nebulizer solution USE 1 VIAL VIA NEBULIZER TWICE  DAILY IN THE MORNING AND EVENING Patient taking differently: 20 mcg 2 (two) times daily. USE 1 VIAL VIA NEBULIZER TWICE  DAILY IN THE MORNING AND EVENING as needed 05/23/22   Nyoka Cowden, MD  isosorbide mononitrate (IMDUR) 30 MG 24 hr tablet Take 1 tablet (30 mg total) by mouth daily. 08/15/22   Arty Baumgartner, NP  latanoprost (XALATAN) 0.005 % ophthalmic solution Place 1 drop into both eyes at bedtime. 02/05/20   [provider]  losartan (COZAAR) 25 MG tablet Take 1 tablet (25 mg total) by mouth daily. 08/15/22   Arty Baumgartner, NP  magnesium oxide (MAG-OX) 400 (240 Mg) MG tablet Take 1 tablet (400 mg total) by mouth daily. 05/14/21   Rolly Salter, MD  memantine (NAMENDA) 10 MG tablet Take 1 tablet (10 mg total) by mouth 2 (two) times daily. 08/08/22   Glean Salvo, NP  metFORMIN (GLUCOPHAGE) 1000 MG tablet Take 1,000 mg by mouth 2 (two) times daily with a meal.    [provider]  montelukast (SINGULAIR) 10 MG  tablet TAKE 1 TABLET BY MOUTH AT  BEDTIME 06/05/22   Nyoka Cowden, MD  Multiple Vitamins-Minerals (ONE-A-DAY WOMENS PO) Take 1 tablet by mouth daily with breakfast.    [provider]  nitroGLYCERIN (NITROSTAT) 0.4 MG SL tablet Place 1 tablet (0.4 mg total) under the tongue every 5 (five) minutes as needed for chest pain. Don't exceed more than 3 doses in 15 mins 06/24/21   Glade Lloyd, MD  pantoprazole (PROTONIX) 40 MG tablet Take 1 tablet (40 mg total) by mouth 2 (two) times daily before a meal. 06/24/21   Glade Lloyd, MD  Probiotic Product (EQ PROBIOTIC) CAPS Take 1 capsule by mouth daily.    [provider]      Allergies    Tramadol and Chlorthalidone    Review  of Systems   Review of Systems  Cardiovascular:  Positive for chest pain.    Physical Exam Updated Vital Signs BP (!) 171/94   Pulse 72   Temp 98.5 F (36.9 C) (Oral)   Resp 15   LMP  (LMP Unknown)   SpO2 97%  Physical Exam  ED Results / Procedures / Treatments   Labs (all labs ordered are listed, but only abnormal results are displayed) Labs Reviewed  CBC WITH DIFFERENTIAL/PLATELET - Abnormal; Notable for the following components:      Result Value   RBC 3.67 (*)    Hemoglobin 11.9 (*)    MCV 100.8 (*)    All other components within normal limits  BASIC METABOLIC PANEL - Abnormal; Notable for the following components:   Glucose, Bld 101 (*)    Creatinine, Ser 1.19 (*)    GFR, Estimated 48 (*)    All other components within normal limits  BRAIN NATRIURETIC PEPTIDE  TROPONIN I (HIGH SENSITIVITY)  TROPONIN I (HIGH SENSITIVITY)    EKG EKG Interpretation  Date/Time:  Saturday Sep 23 2022 18:42:04 EDT Ventricular Rate:  77 PR Interval:  152 QRS Duration: 82 QT Interval:  382 QTC Calculation: 432 R Axis:   2 Text Interpretation: Normal sinus rhythm Possible Anterior infarct , age undetermined Abnormal ECG No significant change since last tracing Confirmed by Elayne Snare ([id number]) on 09/23/2022 7:40:25 PM  Radiology DG Chest 2 View  Result Date: 09/23/2022 CLINICAL DATA:  Chest pain. EXAM: CHEST - 2 VIEW COMPARISON:  Radiograph and CT 08/10/2022 FINDINGS: The cardiomediastinal contours are stable, heart is normal in size. Coronary stent is visualized. Improved left lung base atelectasis. Pulmonary vasculature is normal. No consolidation, pleural effusion, or pneumothorax. No acute osseous abnormalities are seen. IMPRESSION: No acute chest findings. Electronically Signed   By: Narda Rutherford M.D.   On: 09/23/2022 19:42    Procedures Procedures   Medications Ordered in ED Medications  lidocaine (LIDODERM) 5 % 1 patch (1 patch Transdermal Patch Applied 09/23/22  2239)  acetaminophen (TYLENOL) tablet 1,000 mg (1,000 mg Oral Given 09/23/22 2239)    ED Course/ Medical Decision Making/ A&P    Medical Decision Making Amount and/or Complexity of Data Reviewed Labs: ordered.  Risk OTC drugs. Prescription drug management.   I discussed this case with my attending physician who cosigned this note including patient's presenting symptoms, physical exam, and planned diagnostics and interventions. Attending physician stated agreement with plan or made changes to plan which were implemented.   Attending physician assessed patient at bedside.  Final Clinical Impression(s) / ED Diagnoses Final diagnoses:  None    Rx / DC Orders ED Discharge Orders  None

## 2022-09-23 NOTE — ED Notes (Signed)
Dc'd IV.

## 2022-09-23 NOTE — Discharge Instructions (Addendum)
You were seen in the ER today for evaluation of your chest pain.  Your workup is unremarkable.  This is likely muscle skeletal given that it is reproducible upon palpation.  I would like for you to take 1000 mg of Tylenol every 6 hours as needed for pain.  I am also sending you home with a few muscle relaxers to take as needed.  Please do not drive or operate heavy machinery while on this medication as it make you sleepy.  You can also try lidocaine patches to the area as well.  Given your kidney disease, I would avoid any NSAIDs or ibuprofen.  Please make sure you are following with your primary care doctor.  If you have any concerns, or any new or worsening symptoms, please return to the nearest emergency room for evaluation.  Contact a doctor if: You have a fever. Your chest pain gets worse. You have new symptoms. Get help right away if: You feel sick to your stomach (nauseous) or you throw up (vomit). You feel sweaty or light-headed. You have a cough with mucus from your lungs (sputum) or you cough up blood. You are short of breath. These symptoms may be an emergency. Do not wait to see if the symptoms will go away. Get medical help right away. Call your local emergency services (911 in the [place] Do not drive yourself to the hospital.

## 2022-09-24 NOTE — Progress Notes (Unsigned)
Subjective:   Patient ID: [name], female    DOB: [birth year]    MRN: [id number]    Brief patient profile:  74   yobf quit smoking 2005 with nl pfts 2012  with recurrent non-specific resp flares with nl pfts during flares c/w pseudoasthma.     History of Present Illness  01/24/2016  Extended post hosp f/u ov/ transition of care/ /Lirio Bach re: dtca asthma/ vcd symb 160 and pred 5 x 10 mg daily  Chief Complaint  Patient presents with   Follow-up    Pt. recently got out the hopistal for an asthma attack, Pt. states her breathing remains the same, coughing with some yellow to white mucus,Has been needing to use Ventolin more often  last doing well 2 months prior to OV   While on Symb 160/ gerd rx but not dosing ac as rec  Waking up prematurely x 2 months with cough/wheeze/ sob but never contacted me as per the instructions rec Plan A = Automatic = dulera 100 Take 2 puffs first thing in am and then another 2 puffs about 12 hours later.                                      Nexum 40 mg Take 30-60 min before first meal of the day and Pepcid AC 20 mg  at bedtime Work on inhaler technique  Plan B = Backup Only use your albuterol as a rescue medication Plan C = Crisis - only use your albuterol nebulizer if you first try Plan B and it fails to help > ok to use the nebulizer up to every 4 hours but if start needing it regularly call for immediate appointment GERD diet  Take delsym two tsp every 12 hours and use the flutter valve as much as possible and supplement if needed with  tramadol 50 mg up to 2 every 4 hours to suppress the urge to cough  Once you have eliminated the cough for 3 straight days try reducing the tramadol first,  then the delsym as tolerated.   Prednisone 5 mg x 8, then 6, then 4 then 2, then 1 daily x 2 days and off  please schedule a follow up office visit in 2 weeks, sooner if needed with all meds/ inhalers/ neb solutions in hand     Admit date:     05/13/2021   Discharge date: 05/14/21   Discharge Diagnoses Principal Problem:   Hypomagnesemia   Chronic diastolic CHF (congestive heart failure) (HCC)   Severe persistent asthma without complication   Chronic kidney disease, stage 3a (HCC)   CAD in native artery   Diabetes mellitus type 2, controlled (HCC)   Essential hypertension   G E R D   Depression with anxiety   Hyperlipidemia   Vocal cord dysfunction   Hospital Course    * Hypomagnesemia- (present on admission) Presents with complaints of muscle cramps, body aches as well as muscle spasms  x few days.PTA Lab work at the PCPs office showed hypomagnesemia. Patient received 4 g of IV magnesium in the ER. No event on telemetry. Potassium and magnesium level stable.  We will continue supplements on discharge.   Severe persistent asthma without complication- (present on admission) Patient denies having any wheezing when she went to her PCPs office. Possibly that her wheezing actually started getting worse on 1/13 only. Continue nebulizer therapy, Rosalyn Gess  and Pulmicort.  As needed DuoNebs.  I do not think the patient requires steroids for now.     Chronic diastolic CHF (congestive heart failure) (HCC)- (present on admission) Volume status adequate. Does not appear to be volume overloaded.  Weight actually down 15 pound after discharge from the hospital.     Chronic kidney disease, stage 3a (HCC)- (present on admission) Renal function stable close to baseline of 1.16. Monitor.   CAD in native artery- (present on admission) Myoview in 2022 negative. Monitor for now.     Diabetes mellitus type 2, controlled (HCC) Hemoglobin A1c 1.8. On metformin at home   Essential hypertension- (present on admission) Blood pressure stable. Continue home regimen.   G E R D- (present on admission) Continue PPI.   Depression with anxiety- (present on admission) Continue Xanax home regimen.   Hyperlipidemia- (present on admission) On  Lipitor at home. Continue.   Vocal cord dysfunction- (present on admission) Suspecting that is responsible for patient's upper airway wheezing. Recommend ENT follow-up and referral outpatient.       01/20/2022  f/u ov/Jase Reep re: vcd/ asthma maint on performist/bud each am  and skips pm / no recent prednisone Chief Complaint  Patient presents with   Follow-up    She is doing well and wants to talk about her surgery.    Dyspnea:  L knee limiting  Cough: none / Speech therapy helped  Sleeping: flat bed 2 pillows no problem  SABA use: none  02: none  Covid status:   all vax / infected  twice  Rec No change in medications You are cleared for surgery     03/15/2022  f/u ov/Sorayah Schrodt re: vcd/asthma  maint on performist/ bud/singulair/ gerd rx   / did fine with surgery  Chief Complaint  Patient presents with   Follow-up    No new issues since LOV.  Dyspnea:  walk anywhere since L knee surgery but mostly w/in house and mb and back Cough: none  Sleeping: flat bed 2 pillows SABA use: none  02: none  Rec If any flare of resp symptoms go ahead and resume twice daily nebulizer treatments with perforomist and budesonide Late add: ok to leave off the pm dose of PPI if doing great    09/26/2022  f/u ov/Helaina Stefano re: ***   maint on ***  No chief complaint on file.   Dyspnea:  *** Cough: *** Sleeping: *** SABA use: *** 02: *** Covid status:   *** Lung cancer screening :  ***    No obvious day to day or daytime variability or assoc excess/ purulent sputum or mucus plugs or hemoptysis or cp or chest tightness, subjective wheeze or overt sinus or hb symptoms.   *** without nocturnal  or early am exacerbation  of respiratory  c/o's or need for noct saba. Also denies any obvious fluctuation of symptoms with weather or environmental changes or other aggravating or alleviating factors except as outlined above   No unusual exposure hx or h/o childhood pna/ asthma or knowledge of premature  birth.  Current Allergies, Complete Past Medical History, Past Surgical History, Family History, and Social History were reviewed in [employer] record.  ROS  The following are not active complaints unless bolded Hoarseness, sore throat, dysphagia, dental problems, itching, sneezing,  nasal congestion or discharge of excess mucus or purulent secretions, ear ache,   fever, chills, sweats, unintended wt loss or wt gain, classically pleuritic or exertional cp,  orthopnea pnd or arm/hand  swelling  or leg swelling, presyncope, palpitations, abdominal pain, anorexia, nausea, vomiting, diarrhea  or change in bowel habits or change in bladder habits, change in stools or change in urine, dysuria, hematuria,  rash, arthralgias, visual complaints, headache, numbness, weakness or ataxia or problems with walking or coordination,  change in mood or  memory.        No outpatient medications have been marked as taking for the 09/26/22 encounter (Appointment) with Nyoka Cowden, MD.              Objective:   Physical Exam  Wt  09/26/2022       ***  03/15/2022    157  01/20/2022      165  06/28/2021      173  05/17/2021      165 03/15/2021    161  09/08/2020      153 01/12/2020      150  09/09/2019     147  03/12/2019   157  11/04/2018       157  02/23/2016   168 >  04/07/2016  168 > 05/08/2016    168 >  07/17/2016  162 > 09/18/2016    164  > 09/18/2017  160 > 12/28/2017 167  > 03/11/2018  164 > 03/20/2018   160 > 04/05/2018   171 > 04/18/2018  165   Vital signs reviewed  09/26/2022  - Note at rest 02 sats  ***% on ***   General appearance:    ***

## 2022-09-25 NOTE — Telephone Encounter (Signed)
erroneous

## 2022-09-26 ENCOUNTER — Ambulatory Visit (INDEPENDENT_AMBULATORY_CARE_PROVIDER_SITE_OTHER): Payer: Medicare Other | Admitting: Internal Medicine

## 2022-09-26 ENCOUNTER — Encounter: Payer: Self-pay | Admitting: Internal Medicine

## 2022-09-26 VITALS — BP 138/84 | HR 106 | Temp 98.2°F | Ht 62.0 in | Wt 144.2 lb

## 2022-09-26 DIAGNOSIS — J4530 Mild persistent asthma, uncomplicated: Secondary | ICD-10-CM | POA: Diagnosis not present

## 2022-09-26 DIAGNOSIS — R058 Other specified cough: Secondary | ICD-10-CM | POA: Diagnosis not present

## 2022-09-26 MED ORDER — ALBUTEROL SULFATE HFA 108 (90 BASE) MCG/ACT IN AERS: 2.0000 | INHALATION_SPRAY | RESPIRATORY_TRACT | 2 refills | Status: AC | PRN

## 2022-09-26 NOTE — Assessment & Plan Note (Signed)
- CT sinus 08/06/14 > neg  - Allergy profile 02/23/16  >  Eos 0.0 /  IgE  87 pos  RAST  Grass/ trees  - improved 05/08/2016 on gabapentin 300 tid > no change rx > d/c'd 07/2016 DgEs 10/09/17  Low range of penetration without aspiration. Otherwise, normal - Asthma exac 12/17/17- FENO 18. Significant upper airway wheeze, tx pred taper. Add neurontin 100mg  TID.  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 > rec max  gerd rx   - 04/05/2018 rechallenge with gabapentin 300  Qid  - PFT's  04/18/2018  FEV1 1.13 (68 % ) ratio 91  p 7 % improvement from saba p nothing prior to study with DLCO  53/60 % corrects to 147  % for alv volume  s curvature - 02/04/2019 informed only using gabapentin 300 mg hs so ok to d/c  06/23/21  Onset of symptoms reported  2012 p quit smoking  2005  - FEN0 01/24/2016 = 13 with active symptoms on symb 160 2bid > changed to dulera 100  - Spirometry 02/23/2016  NO signifiant obstruction with active symptoms  - 02/23/2016  After extensive coaching HFA effectiveness =    75%  - FENO 02/23/2016  =  12   On dulera 100 2bid (unable to confirm adeherence as did not bring it - Singulair 10 mg daily added 02/23/2016 to dulera 100 2bid with 2 week sample only  (unable to verify she used it) - Allergy profile 02/23/16  >  Eos 0.0 /  IgE  87 pos  RAST  Grass/ trees  - 04/07/2016   symb 80 x one sample given - FENO 04/07/2016  =   43 ? Really on meds ?  - 05/08/2016  After extensive coaching HFA effectiveness =    75% > continue symb 80 2bid - 07/17/2016 added singulair maint    - FENO 09/18/2016  =   15 on symb 160 2bid > try 80 2bid  - flare late feb  2019 ? What maint rx she was taking but on admit "non-adherent per notes 08/12/17  - 09/18/2017  After extensive coaching inhaler device  effectiveness =    75% from a baseline of 50% > restart symb 80 2bid - FENO 09/18/2017  =   20 on symb 160  - Spirometry 09/18/2017  No obstruction at all with active wheeze -  09/09/2019 added prednisone as "plan D"  - 09/08/2020  After extensive coaching inhaler device,  effectiveness =    50% from baseline of 25% (delayed breath in)    06/23/21 Ent eval c/w severe edema from GERD 08/30/20 Dx EG Trace gastroesophageal reflux without evidence of stricture or ulceration. Refer to [facility] 06/29/2021 >>> much better 01/20/2022  03/15/2022 ok to decreased ppi to q am ac if doing great   Tolerating ppi q am at this point, no change in rx needed         Each maintenance medication was reviewed in detail including emphasizing most importantly the difference between maintenance and prns and under what circumstances the  prns are to be triggered using an action plan format where appropriate.  Total time for H and P, chart review, counseling, reviewing hfa  device(s) and generating customized AVS unique to this office visit / same day charting = 24 min

## 2022-09-26 NOTE — Progress Notes (Unsigned)
Office Visit    Patient Name: [name] Date of Encounter: 09/28/2022  Primary Care Provider:  [name], MD Primary Cardiologist:  Donato Schultz, MD Primary Electrophysiologist: None   Past Medical History    Past Medical History:  Diagnosis Date   Anxiety    Arthritis    Asthma    Chronic diastolic CHF (congestive heart failure) (HCC) 07/17/2016   Coronary artery disease    a. NSTEMI 12/2015 - LHC 01/18/16: s/p overlapping DESx2 to RCA, 10% ost-prox Cx,10% mLAD.   Dementia (HCC)    Depression    Diabetes mellitus    Dyslipidemia    GERD (gastroesophageal reflux disease)    History of seizure    Hyperlipidemia    Hypertension    Hypertensive heart disease    NSTEMI (non-ST elevated myocardial infarction) (HCC) 12/2015   Stage 3 chronic kidney disease (HCC)    Past Surgical History:  Procedure Laterality Date   ABDOMINAL HYSTERECTOMY     CARDIAC CATHETERIZATION  1994   minimal LAD dz, no other dz, EF normal   CARDIAC CATHETERIZATION N/A 01/18/2016   Procedure: Left Heart Cath and Coronary Angiography;  Surgeon: Kathleene Hazel, MD;  Location: [facility] INVASIVE CV LAB;  Service: Cardiovascular;  Laterality: N/A;   CARDIAC CATHETERIZATION N/A 01/18/2016   Procedure: Coronary Stent Intervention;  Surgeon: Kathleene Hazel, MD;  Location: [facility] INVASIVE CV LAB;  Service: Cardiovascular;  Laterality: N/A;   COLONOSCOPY WITH PROPOFOL N/A 04/09/2014   Procedure: COLONOSCOPY WITH PROPOFOL;  Surgeon: Florencia Reasons, MD;  Location: [facility] ENDOSCOPY;  Service: Endoscopy;  Laterality: N/A;   CORONARY LITHOTRIPSY N/A 08/10/2022   Procedure: CORONARY LITHOTRIPSY;  Surgeon: Orbie Pyo, MD;  Location: [facility] INVASIVE CV LAB;  Service: Cardiovascular;  Laterality: N/A;   CORONARY STENT INTERVENTION N/A 08/10/2022   Procedure: CORONARY STENT INTERVENTION;  Surgeon: Orbie Pyo, MD;  Location: [facility] INVASIVE CV LAB;  Service: Cardiovascular;  Laterality: N/A;   CORONARY  STENT PLACEMENT  01/19/2016   STENT SYNERGY DES [id number] drug eluting stent was successfully placed, and overlaps the 2.5 x 38 mm Synergy stent placed distally.   CORONARY/GRAFT ACUTE MI REVASCULARIZATION N/A 08/10/2022   Procedure: Coronary/Graft Acute MI Revascularization;  Surgeon: Orbie Pyo, MD;  Location: [facility] INVASIVE CV LAB;  Service: Cardiovascular;  Laterality: N/A;   ESOPHAGOGASTRODUODENOSCOPY (EGD) WITH PROPOFOL N/A 04/09/2014   Procedure: ESOPHAGOGASTRODUODENOSCOPY (EGD) WITH PROPOFOL;  Surgeon: Florencia Reasons, MD;  Location: [facility]COPY;  Service: Endoscopy;  Laterality: N/A;   FOOT SURGERY Bilateral    approx ~2000   HEMORRHOID SURGERY     HERNIA REPAIR     KNEE ARTHROSCOPY     LEFT HEART CATH AND CORONARY ANGIOGRAPHY N/A 08/10/2022   Procedure: LEFT HEART CATH AND CORONARY ANGIOGRAPHY;  Surgeon: Orbie Pyo, MD;  Location: [facility] INVASIVE CV LAB;  Service: Cardiovascular;  Laterality: N/A;   RADIOACTIVE SEED GUIDED EXCISIONAL BREAST BIOPSY Left 01/20/2021   Procedure: RADIOACTIVE SEED GUIDED EXCISIONAL LEFT BREAST BIOPSY;  Surgeon: Almond Lint, MD;  Location: [facility] OR;  Service: General;  Laterality: Left;    Allergies  Allergies  Allergen Reactions   Tramadol Other (See Comments)    Not to be taken because of existing seizure disorder/SEIZURES    Chlorthalidone Other (See Comments)    Was stopped due to kidney function     History of Present Illness    [name]  is a 72 year old female with a PMH  of CAD s/p inferior STEMI 07/2022 with proximal/ostial RCA disease treated with shockwave lithotripsy and DES x 1 with DES x 1 to jailed PDA branch, NSTEMI 2017 with 90% distal RCA/mRCA treated with PTCA/DES x 2 overlapping, HFpEF, HTN, HLD, DM type II, COPD, GERD, CKD stage III, seizures who presents today for post STEMI follow-up.   [name] presented to the ED 08/10/2022 with complaint of substernal chest pain with shortness of breath who was found to have  inferior STEMI.  She underwent left heart cath and was noted to have high-grade proximal seal/ostial RCA disease treated with shockwave lithotripsy and DES x 1 as well as DES x 1 to jailed PDA branch.  She was placed on DAPT with ASA and Brilinta initially and was transition to Plavix prior to discharge. 2D echo was completed postprocedure with EF of 60 to 65% and normal LV function with small pericardial effusion and no evidence of tamponade.  She developed pleuritic chest pain due to small wire perforation in distal RCA.  She was placed on colchicine 0.6 mg twice daily and prednisone 2-week taper.  She was discharged stable condition 08/14/2022.  She was seen in follow-up on 08/22/2022 and was doing much better.  She had 2D echo completed which showed stable effusion.  She was seen in the ED on 09/23/2022 after presenting by EMS for complaint of chest pain.  She was given ASA 324 mg and 1 nitro.  Troponins were obtained and were normal.  [name] presents today with her daughter for 1 month follow-up.  Since last being seen in the office patient reports she is doing better from a cardiac perspective.  Her blood pressure today is well-controlled at 138/86 and heart rate is elevated at 102.  She was seen recently in the ED for complaint of throat pain.  She had a full cardiac workup that was normal.  She was treated with lidocaine and Tylenol for discomfort.  She reports today ongoing throat pain that is associated with spasms.  She was seen recently by her PCP today and I advised her to seek advice regarding referral to ENT.  Patient denies chest pain, palpitations, dyspnea, PND, orthopnea, nausea, vomiting, dizziness, syncope, edema, weight gain, or early satiety.   Home Medications    Current Outpatient Medications  Medication Sig Dispense Refill   acetaminophen (TYLENOL) 500 MG tablet Take 2 tablets (1,000 mg total) by mouth every 6 (six) hours as needed. 30 tablet 0   albuterol (PROAIR HFA) 108 (90  Base) MCG/ACT inhaler Inhale 2 puffs into the lungs every 4 (four) hours as needed for wheezing or shortness of breath. 18 g 2   ALPRAZolam (XANAX) 0.5 MG tablet Take 1 tablet (0.5 mg total) by mouth 2 (two) times daily as needed for anxiety. 30 tablet 0   aspirin 81 MG chewable tablet Chew 1 tablet (81 mg total) by mouth daily. 90 tablet 2   atorvastatin (LIPITOR) 80 MG tablet Take 1 tablet (80 mg total) by mouth daily. 90 tablet 1   Biotin 5000 MCG CAPS Take 5,000 mcg by mouth daily.     clopidogrel (PLAVIX) 75 MG tablet Take 1 tablet (75 mg total) by mouth daily. 90 tablet 2   colchicine 0.6 MG tablet Take 1 tablet (0.6 mg total) by mouth daily. 90 tablet 0   cyclobenzaprine (FLEXERIL) 10 MG tablet Take 1 tablet (10 mg total) by mouth 2 (two) times daily as needed for muscle spasms. 6 tablet 0   divalproex (  DEPAKOTE ER) 500 MG 24 hr tablet TAKE 1 TABLET BY MOUTH AT  BEDTIME 90 tablet 3   empagliflozin ([name]) 10 MG TABS tablet Take 1 tablet (10 mg total) by mouth daily. 30 tablet 2   ferrous sulfate 325 (65 FE) MG tablet Take 325 mg by mouth daily with breakfast.     isosorbide mononitrate (IMDUR) 30 MG 24 hr tablet Take 1 tablet (30 mg total) by mouth daily. 90 tablet 1   latanoprost (XALATAN) 0.005 % ophthalmic solution Place 1 drop into both eyes at bedtime.     losartan (COZAAR) 25 MG tablet Take 1 tablet (25 mg total) by mouth daily. 90 tablet 0   magnesium oxide (MAG-OX) 400 (240 Mg) MG tablet Take 1 tablet (400 mg total) by mouth daily. 60 tablet 0   memantine (NAMENDA) 10 MG tablet Take 1 tablet (10 mg total) by mouth 2 (two) times daily. 180 tablet 3   metFORMIN (GLUCOPHAGE) 1000 MG tablet Take 1,000 mg by mouth 2 (two) times daily with a meal.     montelukast (SINGULAIR) 10 MG tablet TAKE 1 TABLET BY MOUTH AT  BEDTIME 90 tablet 3   Multiple Vitamins-Minerals (ONE-A-DAY WOMENS PO) Take 1 tablet by mouth daily with breakfast.     nitroGLYCERIN (NITROSTAT) 0.4 MG SL tablet Place 1  tablet (0.4 mg total) under the tongue every 5 (five) minutes as needed for chest pain. Don't exceed more than 3 doses in 15 mins 30 tablet 0   pantoprazole (PROTONIX) 40 MG tablet Take 1 tablet (40 mg total) by mouth 2 (two) times daily before a meal. (Patient taking differently: Take 40 mg by mouth daily.) 60 tablet 0   Probiotic Product (EQ PROBIOTIC) CAPS Take 1 capsule by mouth daily.     albuterol (PROVENTIL) (2.5 MG/3ML) 0.083% nebulizer solution Take 3 mLs (2.5 mg total) by nebulization every 4 (four) hours as needed for wheezing or shortness of breath. 75 mL 12   No current facility-administered medications for this visit.     Review of Systems  Please see the history of present illness.    (+) Throat pain (+) Knee pain  All other systems reviewed and are otherwise negative except as noted above.  Physical Exam    Wt Readings from Last 3 Encounters:  09/28/22 145 lb 6.4 oz (66 kg)  09/26/22 144 lb 3.2 oz (65.4 kg)  09/05/22 147 lb 11.3 oz (67 kg)   VS: Vitals:   09/28/22 1412  BP: 138/86  Pulse: (!) 107  SpO2: 99%  ,Body mass index is 26.59 kg/m.  Constitutional:      Appearance: Healthy appearance. Not in distress.  Neck:     Vascular: JVD normal.  Pulmonary:     Effort: Pulmonary effort is normal.     Breath sounds: No wheezing. No rales. Diminished in the bases Cardiovascular:     Normal rate. Regular rhythm. Normal S1. Normal S2.      Murmurs: There is no murmur.  Edema:    Peripheral edema absent.  Abdominal:     Palpations: Abdomen is soft non tender. There is no hepatomegaly.  Skin:    General: Skin is warm and dry.  Neurological:     General: No focal deficit present.     Mental Status: Alert and oriented to person, place and time.     Cranial Nerves: Cranial nerves are intact.  EKG/LABS/ Recent Cardiac Studies    ECG personally reviewed by me today -none completed today  Cardiac Studies & Procedures   CARDIAC CATHETERIZATION  CARDIAC  CATHETERIZATION 08/10/2022  Narrative   Prox RCA to Dist RCA lesion is 10% stenosed.   Prox RCA lesion is 75% stenosed.   RPAV lesion is 99% stenosed.   A stent was successfully placed.   A stent was successfully placed.   A stent was successfully placed.   Post intervention, there is a 0% residual stenosis.   Post intervention, there is a 0% residual stenosis.   Post intervention, there is a 0% residual stenosis.  1.  High-grade proximal and ostial right coronary artery disease treated with shockwave lithotripsy and 1 drug-eluting stent. 2.  High-grade distal right coronary artery disease treated with 1 drug-eluting stent with plaque shift into the jailed PDA branch.  This was complicated by a small distal wire perforation.  The patient was not hypotensive and a stat echocardiogram obtained in the cardiac catheterization laboratory demonstrated no significant pericardial effusion. 3.  Patent proximal and mid right coronary artery stents. 4.  Mild disease of the LAD. 5.  LVEDP of 14 mmHg.  Recommendation: Dual antiplatelet therapy for at least 1 year.  Echocardiogram will be done later today to assess for pericardial effusion.  The case was reviewed with Dr. Gery Pray the team C attending.  Findings Coronary Findings Diagnostic  Dominance: Right  Left Anterior Descending There is mild diffuse disease throughout the vessel.  Right Coronary Artery Prox RCA lesion is 75% stenosed. Prox RCA to Dist RCA lesion is 10% stenosed. The lesion was previously treated .  Right Posterior Atrioventricular Artery RPAV lesion is 99% stenosed.  Intervention  Prox RCA lesion Stent A stent was successfully placed. Post-Intervention Lesion Assessment The intervention was successful. Pre-interventional TIMI flow is 3. Post-intervention TIMI flow is 3. There is a 0% residual stenosis post intervention.  Prox RCA to Dist RCA lesion Stent A stent was successfully placed. Post-Intervention Lesion  Assessment The intervention was successful. Pre-interventional TIMI flow is 3. Post-intervention TIMI flow is 3. There is a 0% residual stenosis post intervention.  RPAV lesion Stent A stent was successfully placed. Post-Intervention Lesion Assessment The intervention was successful. Pre-interventional TIMI flow is 3. Post-intervention TIMI flow is 3. There is a 0% residual stenosis post intervention.   CARDIAC CATHETERIZATION  CARDIAC CATHETERIZATION 01/18/2016  Narrative  Mid RCA lesion, 70 %stenosed.  Dist RCA lesion, 90 %stenosed.  A STENT SYNERGY DES [id number] drug eluting stent was successfully placed, and overlaps the 2.5 x 38 mm Synergy stent placed distally.  Prox RCA lesion, 80 %stenosed.  Post intervention, there is a 0% residual stenosis.  Ost Cx to Prox Cx lesion, 10 %stenosed.  Mid LAD lesion, 10 %stenosed.  1. NSTEMI 2. Severe single vessel CAD with severe stenosis in the mid and distal RCA 3. Successful PTCA/DES x 2 mid and distal RCA  Recommendations: Dual anti-platelet therapy with ASA and Brilinta for one year. Continue statin.  Findings Coronary Findings Diagnostic  Dominance: Right  Left Anterior Descending  First Diagonal Branch Vessel is moderate in size.  Second Diagonal Branch Vessel is small in size.  Third Diagonal Branch Vessel is small in size.  Left Circumflex Vessel is moderate in size.  First Obtuse Marginal Branch Vessel is moderate in size.  Second Obtuse Marginal Branch Vessel is small in size.  Right Coronary Artery  Intervention  Prox RCA lesion Angioplasty (Also treats lesions: Mid RCA, and Dist RCA) Lesion crossed with guidewire. Pre-stent angioplasty was performed using a BALLOON EMERGE MR 2.0X12.  A STENT SYNERGY DES [id number] drug eluting stent was successfully placed, and overlaps previously placed stent. Stent strut is well apposed. Post-stent angioplasty was performed using a BALL[place]ON [place] EMERGE MR 2.75X30. The  pre-interventional distal flow is normal (TIMI 3).  The post-interventional distal flow is normal (TIMI 3). The intervention was successful . No complications occurred at this lesion. There is a 0% residual stenosis post intervention.  Mid RCA lesion Angioplasty (Also treats lesions: Prox RCA, and Dist RCA) See details in Prox RCA lesion. There is a 0% residual stenosis post intervention.  Dist RCA lesion Angioplasty (Also treats lesions: Prox RCA, and Mid RCA) See details in Prox RCA lesion. There is a 0% residual stenosis post intervention.   STRESS TESTS  MYOCARDIAL PERFUSION IMAGING 02/06/2022  Narrative   The study is normal. The study is low risk.   No ST deviation was noted.   LV perfusion is normal.   Left ventricular function is normal. Nuclear stress EF: 71 %. The left ventricular ejection fraction is hyperdynamic (>65%). End diastolic cavity size is normal.   Prior study available for comparison from 05/13/2020. No significant change from prior study.   ECHOCARDIOGRAM  ECHOCARDIOGRAM COMPLETE 09/19/2022  Narrative ECHOCARDIOGRAM REPORT    Patient Name:   ARNESHIA BARROIS Date of Exam: 09/19/2022 Medical Rec #:  [id number]      Height:       62.5 in Accession #:    [id number]     Weight:       147.7 lb Date of Birth:  Sep 24, 1950       BSA:          1.691 m Patient Age:    71 years       BP:           130/86 mmHg Patient Gender: F              HR:           74 bpm. Exam Location:  [address]  Procedure: 2D Echo, 3D Echo, Cardiac Doppler and Color Doppler  Indications:    I31.3 Pericardial Effusion  History:        Patient has prior history of Echocardiogram examinations, most recent 08/11/2022. CHF, CAD and Previous Myocardial Infarction; Risk Factors:Family History of Coronary Artery Disease, Hypertension, Diabetes, Dyslipidemia and Former Smoker. Pericardial Effusion (status post Cath wire Perforation, 08-10-22), Pericarditis, Chronic Kidney Disease,  NSTEMI.  Sonographer:    Farrel Conners RDCS Referring Phys: Ames Coupe Soniya Ashraf  IMPRESSIONS   1. Pericardium: Trivial pericardial effusion is present. The pericardial effusion is anterior to the right ventricle. 2. Left ventricular ejection fraction, by estimation, is 60 to 65%. Left ventricular ejection fraction by 3D volume is 65 %. The left ventricle has normal function. The left ventricle has no regional wall motion abnormalities. There is mild left ventricular hypertrophy. Left ventricular diastolic parameters are consistent with Grade I diastolic dysfunction (impaired relaxation). 3. Right ventricular systolic function is normal. The right ventricular size is normal. 4. There is no evidence of cardiac tamponade. 5. The mitral valve is normal in structure. Mild mitral valve regurgitation. No evidence of mitral stenosis. 6. The aortic valve is tricuspid. Aortic valve regurgitation is not visualized. No aortic stenosis is present. 7. The inferior vena cava is normal in size with greater than 50% respiratory variability, suggesting right atrial pressure of 3 mmHg.  Comparison(s): Prior images reviewed side by side.  FINDINGS Left Ventricle: Left ventricular ejection fraction, by estimation, is  60 to 65%. Left ventricular ejection fraction by 3D volume is 65 %. The left ventricle has normal function. The left ventricle has no regional wall motion abnormalities. The left ventricular internal cavity size was normal in size. There is mild left ventricular hypertrophy. Left ventricular diastolic parameters are consistent with Grade I diastolic dysfunction (impaired relaxation).  Right Ventricle: The right ventricular size is normal. No increase in right ventricular wall thickness. Right ventricular systolic function is normal.  Left Atrium: Left atrial size was normal in size.  Right Atrium: Right atrial size was normal in size.  Pericardium: Trivial pericardial effusion is present. The  pericardial effusion is anterior to the right ventricle. There is no evidence of cardiac tamponade.  Mitral Valve: The mitral valve is normal in structure. Mild mitral valve regurgitation. No evidence of mitral valve stenosis.  Tricuspid Valve: The tricuspid valve is normal in structure. Tricuspid valve regurgitation is not demonstrated. No evidence of tricuspid stenosis.  Aortic Valve: The aortic valve is tricuspid. Aortic valve regurgitation is not visualized. No aortic stenosis is present.  Pulmonic Valve: The pulmonic valve was normal in structure. Pulmonic valve regurgitation is not visualized. No evidence of pulmonic stenosis.  Aorta: The aortic root is normal in size and structure.  Venous: The inferior vena cava is normal in size with greater than 50% respiratory variability, suggesting right atrial pressure of 3 mmHg.  IAS/Shunts: No atrial level shunt detected by color flow Doppler.   LEFT VENTRICLE PLAX 2D LVIDd:         2.90 cm         Diastology LVIDs:         1.50 cm         LV e' medial:    4.23 cm/s LV PW:         1.20 cm         LV E/e' medial:  14.5 LV IVS:        1.10 cm         LV e' lateral:   7.21 cm/s LVOT diam:     2.00 cm         LV E/e' lateral: 8.5 LV SV:         47 LV SV Index:   28 LVOT Area:     3.14 cm        3D Volume EF LV 3D EF:    Left ventricul ar ejection fraction by 3D volume is 65 %.  3D Volume EF: 3D EF:        65 % LV EDV:       93 ml LV ESV:       32 ml LV SV:        60 ml  RIGHT VENTRICLE RV Basal diam:  2.90 cm RV S prime:     12.90 cm/s TAPSE (M-mode): 1.6 cm  LEFT ATRIUM             Index        RIGHT ATRIUM           Index LA diam:        2.90 cm 1.72 cm/m   RA Pressure: 3.00 mmHg LA Vol (A2C):   44.3 ml 26.20 ml/m  RA Area:     8.27 cm LA Vol (A4C):   31.6 ml 18.69 ml/m  RA Volume:   16.20 ml  9.58 ml/m LA Biplane Vol: 38.3 ml 22.65 ml/m AORTIC VALVE LVOT Vmax:   80.95 cm/s  LVOT Vmean:  52.900 cm/s LVOT VTI:     0.150 m  AORTA Ao Root diam: 2.80 cm Ao Asc diam:  3.20 cm  MITRAL VALVE               TRICUSPID VALVE MV Area (PHT): cm         Estimated RAP:  3.00 mmHg MV Decel Time: 195 msec MV E velocity: 61.45 cm/s  SHUNTS MV A velocity: 92.62 cm/s  Systemic VTI:  0.15 m MV E/A ratio:  0.66        Systemic Diam: 2.00 cm  Donato Schultz MD Electronically signed by Donato Schultz MD Signature Date/Time: 09/19/2022/4:23:12 PM    Final               Lab Results  Component Value Date   WBC 5.6 09/23/2022   HGB 11.9 (L) 09/23/2022   HCT 37.0 09/23/2022   MCV 100.8 (H) 09/23/2022   PLT 302 09/23/2022   Lab Results  Component Value Date   CREATININE 1.19 (H) 09/23/2022   BUN 19 09/23/2022   NA 135 09/23/2022   K 3.7 09/23/2022   CL 104 09/23/2022   CO2 22 09/23/2022   Lab Results  Component Value Date   ALT 17 10/04/2021   AST 18 10/04/2021   ALKPHOS 62 10/04/2021   BILITOT 0.6 10/04/2021   Lab Results  Component Value Date   CHOL 135 08/12/2022   HDL 50 08/12/2022   LDLCALC 47 08/12/2022   TRIG 188 (H) 08/12/2022   CHOLHDL 2.7 08/12/2022    Lab Results  Component Value Date   HGBA1C 6.3 (H) 08/10/2022     Assessment & Plan    1.  Coronary artery disease: -s/p inferior MI with high-grade proximal seal/ostial RCA disease treated with shockwave lithotripsy and DES x 1 with DES x 1 to jailed PDA branch complicated by small wire distal perforation with small pericardial effusion -Today patient report some throat pain that was determined to be musculoskeletal in nature. -Ischemic workup in the ED was normal. -Continue GDMT with Lipitor 80 mg, Plavix 75 mg, colchicine 0.6 mg, Imdur 30 mg, ASA 81 mg   2.  Pleuritic chest pain: -Repeat 2D echo completed showing stable and reduced pericardial effusion -Continue colchicine 0.6 mg daily x 3 months and prednisone taper -She was advised to use as needed Tylenol for lingering discomfort.  3.  Essential  hypertension: -Patient's blood pressure today was well-controlled at 138/86 -Continue losartan 25 mg daily   4.  Hyperlipidemia: -Patient's LDL cholesterol was 47 -Continue Lipitor 80 mg daily   5.  CKD stage IIIa: -Patient's most recent creatinine was 1.2 -We will recheck BMET today  Disposition: Follow-up with Donato Schultz, MD or APP in 3 months    Medication Adjustments/Labs and Tests Ordered: Current medicines are reviewed at length with the patient today.  Concerns regarding medicines are outlined above.   Signed, Napoleon Form, Leodis Rains, NP 09/28/2022, 2:27 PM [facility]

## 2022-09-26 NOTE — Assessment & Plan Note (Signed)
Onset of symptoms reported  2012 p quit smoking  2005  - FEN0 01/24/2016 = 13 with active symptoms on symb 160 2bid > changed to dulera 100  - Spirometry 02/23/2016  NO signifiant obstruction with active symptoms  - 02/23/2016  After extensive coaching HFA effectiveness =    75%  - FENO 02/23/2016  =  12   On dulera 100 2bid (unable to confirm adeherence as did not bring it - Singulair 10 mg daily added 02/23/2016 to dulera 100 2bid with 2 week sample only  (unable to verify she used it) - Allergy profile 02/23/16  >  Eos 0.0 /  IgE  87 pos  RAST  Grass/ trees  - 04/07/2016   symb 80 x one sample given - FENO 04/07/2016  =   43 ? Really on meds ?  - 05/08/2016  After extensive coaching HFA effectiveness =    75% > continue symb 80 2bid - 07/17/2016 added singulair maint    - FENO 09/18/2016  =   15 on symb 160 2bid > try 80 2bid  - flare late feb 2019 ? What maint rx she was taking but on admit "non-adherent per notes 08/12/17  - 09/18/2017  After extensive coaching inhaler device  effectiveness =    75% from a baseline of 50% > restart symb 80 2bid - FENO 09/18/2017  =   20 on symb 160  - Spirometry 09/18/2017  No obstruction at all with active wheeze -  09/09/2019 added prednisone as "plan D"  - 09/08/2020  After extensive coaching inhaler device,  effectiveness =    50% from baseline of 25% (delayed breath in)    06/23/21 Ent eval c/w severe edema from GERD   Despite chf , the asthma component is well controlled on just singulair and rare prn saba at this point so no change in rx needed/ can f/u a 6 m, sooner prn

## 2022-09-26 NOTE — Patient Instructions (Signed)
Only use your albuterol as a rescue medication to be used if you can't catch your breath by resting or doing a relaxed purse lip breathing pattern.  - The less you use it, the better it will work when you need it. - Ok to use up to 2 puffs  every 4 hours if you must but call for immediate appointment if use goes up over your usual need - Don't leave home without it !!  (think of it like the spare tire for your car)   Also  Ok to try albuterol 15 min before an activity (on alternating days)  that you know would usually make you short of breath and see if it makes any difference and if makes none then don't take albuterol after activity unless you can't catch your breath as this means it's the resting that helps, not the albuterol.       Please schedule a follow up visit in 6 months but call sooner if needed to get started on symbicort 80

## 2022-09-27 ENCOUNTER — Encounter (HOSPITAL_COMMUNITY)
Admission: RE | Admit: 2022-09-27 | Discharge: 2022-09-27 | Disposition: A | Discharge status: Home / Self Care | Payer: Medicare Other | Source: Ambulatory Visit | Attending: Cardiology | Admitting: Cardiology

## 2022-09-27 DIAGNOSIS — I2111 ST elevation (STEMI) myocardial infarction involving right coronary artery: Secondary | ICD-10-CM

## 2022-09-27 DIAGNOSIS — Z955 Presence of coronary angioplasty implant and graft: Secondary | ICD-10-CM

## 2022-09-28 ENCOUNTER — Other Ambulatory Visit: Payer: Self-pay | Admitting: Physician Assistant

## 2022-09-28 ENCOUNTER — Ambulatory Visit
Admission: RE | Admit: 2022-09-28 | Discharge: 2022-09-28 | Disposition: A | Discharge status: Home / Self Care | Payer: Medicare Other | Source: Ambulatory Visit | Attending: Physician Assistant | Admitting: Physician Assistant

## 2022-09-28 ENCOUNTER — Encounter: Payer: Self-pay | Admitting: Nurse Practitioner

## 2022-09-28 ENCOUNTER — Encounter: Payer: Self-pay | Admitting: Physician Assistant

## 2022-09-28 ENCOUNTER — Ambulatory Visit: Payer: Medicare Other | Attending: Nurse Practitioner | Admitting: Nurse Practitioner

## 2022-09-28 VITALS — BP 138/86 | HR 107 | Ht 62.0 in | Wt 145.4 lb

## 2022-09-28 DIAGNOSIS — I10 Essential (primary) hypertension: Secondary | ICD-10-CM

## 2022-09-28 DIAGNOSIS — I2510 Atherosclerotic heart disease of native coronary artery without angina pectoris: Secondary | ICD-10-CM

## 2022-09-28 DIAGNOSIS — R2241 Localized swelling, mass and lump, right lower limb: Secondary | ICD-10-CM

## 2022-09-28 DIAGNOSIS — E785 Hyperlipidemia, unspecified: Secondary | ICD-10-CM

## 2022-09-28 DIAGNOSIS — R0781 Pleurodynia: Secondary | ICD-10-CM

## 2022-09-28 DIAGNOSIS — N1831 Chronic kidney disease, stage 3a: Secondary | ICD-10-CM

## 2022-09-28 DIAGNOSIS — I3139 Other pericardial effusion (noninflammatory): Secondary | ICD-10-CM | POA: Diagnosis not present

## 2022-09-28 NOTE — Patient Instructions (Signed)
Medication Instructions:  Your physician recommends that you continue on your current medications as directed. Please refer to the Current Medication list given to you today. *If you need a refill on your cardiac medications before your next appointment, please call your pharmacy*   Lab Work: None ordered If you have labs (blood work) drawn today and your tests are completely normal, you will receive your results only by: MyChart Message (if you have MyChart) OR A paper copy in the mail If you have any lab test that is abnormal or we need to change your treatment, we will call you to review the results.   Testing/Procedures: None ordered   Follow-Up: At [facility], you and your health needs are our priority.  As part of our continuing mission to provide you with exceptional heart care, we have created designated Provider Care Teams.  These Care Teams include your primary Cardiologist (physician) and Advanced Practice Providers (APPs -  Physician Assistants and Nurse Practitioners) who all work together to provide you with the care you need, when you need it.  We recommend signing up for the patient portal called "MyChart".  Sign up information is provided on this After Visit Summary.  MyChart is used to connect with patients for Virtual Visits (Telemedicine).  Patients are able to view lab/test results, encounter notes, upcoming appointments, etc.  Non-urgent messages can be sent to your provider as well.   To learn more about what you can do with MyChart, go to [email or web address].    Your next appointment:   3 month(s)  Provider:   Donato Schultz, MD  or Robin Searing, NP   Other Instructions Check your blood pressure and heart rate daily for 1-2 weeks, then contact the office with your readings.

## 2022-09-29 ENCOUNTER — Encounter (HOSPITAL_COMMUNITY)
Admission: RE | Admit: 2022-09-29 | Discharge: 2022-09-29 | Disposition: A | Discharge status: Home / Self Care | Payer: Medicare Other | Source: Ambulatory Visit | Attending: Cardiology | Admitting: Cardiology

## 2022-09-29 DIAGNOSIS — I2111 ST elevation (STEMI) myocardial infarction involving right coronary artery: Secondary | ICD-10-CM | POA: Diagnosis not present

## 2022-09-29 DIAGNOSIS — Z955 Presence of coronary angioplasty implant and graft: Secondary | ICD-10-CM

## 2022-10-02 ENCOUNTER — Other Ambulatory Visit (HOSPITAL_COMMUNITY): Payer: Self-pay

## 2022-10-02 ENCOUNTER — Encounter (HOSPITAL_COMMUNITY)
Admission: RE | Admit: 2022-10-02 | Discharge: 2022-10-02 | Disposition: A | Discharge status: Home / Self Care | Payer: Medicare Other | Source: Ambulatory Visit | Attending: Cardiology | Admitting: Cardiology

## 2022-10-02 ENCOUNTER — Other Ambulatory Visit: Payer: Self-pay | Admitting: Physician Assistant

## 2022-10-02 DIAGNOSIS — Z955 Presence of coronary angioplasty implant and graft: Secondary | ICD-10-CM | POA: Insufficient documentation

## 2022-10-02 DIAGNOSIS — R222 Localized swelling, mass and lump, trunk: Secondary | ICD-10-CM

## 2022-10-02 DIAGNOSIS — I2111 ST elevation (STEMI) myocardial infarction involving right coronary artery: Secondary | ICD-10-CM | POA: Insufficient documentation

## 2022-10-02 NOTE — Progress Notes (Signed)
[name]'s entry blood pressure was 116/72 heart rate 86. Patient's BP was noted at 170/69 then 168/88 on the nustep. [name] said she realized she has not filled her pill box and took her daily medications today. Patient advised not complete her session at cardiac rehab and to go home to take her medications. [name]'s exit blood pressure was noted at 144/94. [name] plans to return to exercise on Wednesday.Jacqueline Headings RN BSN

## 2022-10-04 ENCOUNTER — Encounter (HOSPITAL_COMMUNITY)
Admission: RE | Admit: 2022-10-04 | Discharge: 2022-10-04 | Disposition: A | Discharge status: Home / Self Care | Payer: Medicare Other | Source: Ambulatory Visit | Attending: Cardiology | Admitting: Cardiology

## 2022-10-04 DIAGNOSIS — I2111 ST elevation (STEMI) myocardial infarction involving right coronary artery: Secondary | ICD-10-CM | POA: Diagnosis not present

## 2022-10-04 DIAGNOSIS — Z955 Presence of coronary angioplasty implant and graft: Secondary | ICD-10-CM

## 2022-10-04 NOTE — Progress Notes (Signed)
Cardiac Individual Treatment Plan  Patient Details  Name: [name] MRN: [id number] Date of Birth: 1951-04-13 Referring Provider:   Flowsheet Row INTENSIVE CARDIAC REHAB [name] from 09/05/2022 in [facility] for [name], [name], [name]  Referring Provider Jacqueline Schultz, MD       Initial Encounter Date:  [name], [name], [name]  Date 09/05/22       Visit Diagnosis: 08/10/22 STEMI  08/10/22 S/P DES RCA x2 shockwave lithotripsy  Patient's Home Medications on Admission:  Current Outpatient Medications:    acetaminophen (TYLENOL) 500 MG tablet, Take 2 tablets (1,000 mg total) by mouth every 6 (six) hours as needed., Disp: 30 tablet, Rfl: 0   albuterol (PROAIR HFA) 108 (90 Base) MCG/ACT inhaler, Inhale 2 puffs into the lungs every 4 (four) hours as needed for wheezing or shortness of breath., Disp: 18 g, Rfl: 2   albuterol (PROVENTIL) (2.5 MG/3ML) 0.083% nebulizer solution, Take 3 mLs (2.5 mg total) by nebulization every 4 (four) hours as needed for wheezing or shortness of breath., Disp: 75 mL, Rfl: 12   ALPRAZolam (XANAX) 0.5 MG tablet, Take 1 tablet (0.5 mg total) by mouth 2 (two) times daily as needed for anxiety., Disp: 30 tablet, Rfl: 0   aspirin 81 MG chewable tablet, Chew 1 tablet (81 mg total) by mouth daily., Disp: 90 tablet, Rfl: 2   atorvastatin (LIPITOR) 80 MG tablet, Take 1 tablet (80 mg total) by mouth daily., Disp: 90 tablet, Rfl: 1   Biotin 5000 MCG CAPS, Take 5,000 mcg by mouth daily., Disp: , Rfl:    clopidogrel (PLAVIX) 75 MG tablet, Take 1 tablet (75 mg total) by mouth daily., Disp: 90 tablet, Rfl: 2   colchicine 0.6 MG tablet, Take 1 tablet (0.6 mg total) by mouth daily., Disp: 90 tablet, Rfl: 0   cyclobenzaprine (FLEXERIL) 10 MG tablet, Take 1 tablet (10 mg total) by mouth 2 (two) times daily as needed for muscle spasms., Disp:  6 tablet, Rfl: 0   divalproex (DEPAKOTE ER) 500 MG 24 hr tablet, TAKE 1 TABLET BY MOUTH AT  BEDTIME, Disp: 90 tablet, Rfl: 3   empagliflozin ([name]) 10 MG TABS tablet, Take 1 tablet (10 mg total) by mouth daily., Disp: 30 tablet, Rfl: 2   ferrous sulfate 325 (65 FE) MG tablet, Take 325 mg by mouth daily with breakfast., Disp: , Rfl:    isosorbide mononitrate (IMDUR) 30 MG 24 hr tablet, Take 1 tablet (30 mg total) by mouth daily., Disp: 90 tablet, Rfl: 1   latanoprost (XALATAN) 0.005 % ophthalmic solution, Place 1 drop into both eyes at bedtime., Disp: , Rfl:    losartan (COZAAR) 25 MG tablet, Take 1 tablet (25 mg total) by mouth daily., Disp: 90 tablet, Rfl: 0   magnesium oxide (MAG-OX) 400 (240 Mg) MG tablet, Take 1 tablet (400 mg total) by mouth daily., Disp: 60 tablet, Rfl: 0   memantine (NAMENDA) 10 MG tablet, Take 1 tablet (10 mg total) by mouth 2 (two) times daily., Disp: 180 tablet, Rfl: 3   metFORMIN (GLUCOPHAGE) 1000 MG tablet, Take 1,000 mg by mouth 2 (two) times daily with a meal., Disp: , Rfl:    montelukast (SINGULAIR) 10 MG tablet, TAKE 1 TABLET BY MOUTH AT  BEDTIME, Disp: 90 tablet, Rfl: 3   Multiple Vitamins-Minerals (ONE-A-DAY WOMENS PO), Take 1 tablet by mouth daily with breakfast., Disp: , Rfl:  nitroGLYCERIN (NITROSTAT) 0.4 MG SL tablet, Place 1 tablet (0.4 mg total) under the tongue every 5 (five) minutes as needed for chest pain. Don't exceed more than 3 doses in 15 mins, Disp: 30 tablet, Rfl: 0   pantoprazole (PROTONIX) 40 MG tablet, Take 1 tablet (40 mg total) by mouth 2 (two) times daily before a meal. (Patient taking differently: Take 40 mg by mouth daily.), Disp: 60 tablet, Rfl: 0   Probiotic Product (EQ PROBIOTIC) CAPS, Take 1 capsule by mouth daily., Disp: , Rfl:   Past Medical History: Past Medical History:  Diagnosis Date   Anxiety    Arthritis    Asthma    Chronic diastolic CHF (congestive [name] failure) (HCC) 07/17/2016   Coronary artery disease    a.  NSTEMI 12/2015 - [facility] 01/18/16: s/p overlapping DESx2 to RCA, 10% ost-prox Cx,10% mLAD.   Dementia (HCC)    Depression    Diabetes mellitus    Dyslipidemia    GERD (gastroesophageal reflux disease)    History of seizure    Hyperlipidemia    Hypertension    Hypertensive [name] disease    NSTEMI (non-ST elevated myocardial infarction) (HCC) 12/2015   Stage 3 chronic kidney disease (HCC)     Tobacco Use: Social History   Tobacco Use  Smoking Status Former   Packs/day: 1.00   Years: 30.00   Additional pack years: 0.00   Total pack years: 30.00   Types: Cigarettes   Quit date: 01/09/2004   Years since quitting: 18.7  Smokeless Tobacco Never    Labs: Review Jacqueline  More data exists      Latest Ref Rng & Units 08/14/2021 08/20/2021 03/31/2022 08/10/2022 08/12/2022  Labs for ITP Cardiac and Pulmonary Rehab  Cholestrol 0 - 200 mg/dL - - - - 161   LDL (calc) 0 - 99 mg/dL - - - - 47   HDL-C >09 mg/dL - - - - 50   Trlycerides <150 mg/dL - - - - 604   Hemoglobin A1c 4.8 - 5.6 % - - - 6.3  -  PH, Arterial 7.35 - 7.45 - - - 7.376  -  PCO2 arterial 32 - 48 mmHg - - - 35.7  -  Bicarbonate 20.0 - 28.0 mmol/L 26.6  25.4  - 21.0  -  TCO2 22 - 32 mmol/L - - 23  22  -  Acid-base deficit 0.0 - 2.0 mmol/L - - - 4.0  -  O2 Saturation % 96.3  99.5  - 100  -    Capillary Blood Glucose: Lab Results  Component Value Date   GLUCAP 98 09/13/2022   GLUCAP 102 (H) 09/13/2022   GLUCAP 121 (H) 09/11/2022   GLUCAP 100 (H) 09/11/2022   GLUCAP 163 (H) 08/14/2022     Exercise Target Goals: Exercise Program Goal: Individual exercise prescription set using results from initial 6 min walk test and THRR while considering  patient's activity barriers and safety.   Exercise Prescription Goal: Initial exercise prescription builds to 30-45 minutes a day of aerobic activity, 2-3 days per week.  Home exercise guidelines will be given to patient during program as part of exercise prescription that the  participant will acknowledge.  Activity Barriers & Risk Stratification:  Activity Barriers & Cardiac Risk Stratification - 09/05/22 1425       Activity Barriers & Cardiac Risk Stratification   Activity Barriers Arthritis;Joint Problems;Deconditioning;Balance Concerns    Cardiac Risk Stratification High  6 Minute Walk:  6 Minute Walk     Row Name 09/05/22 1145         6 Minute Walk   Phase Initial     Distance 1227 feet     Walk Time 6 minutes     # of Rest Breaks 0     MPH 2.32     METS 2.6     RPE 9     Perceived Dyspnea  0     VO2 Peak 9.11     Comments 4/10 bilateral foot pain     Resting HR 72 bpm     Resting BP 144/80     Resting Oxygen Saturation  100 %     Exercise Oxygen Saturation  during 6 min walk 100 %     Max Ex. HR 93 bpm     Max Ex. BP 146/80     2 Minute Post BP 136/82              Oxygen Initial Assessment:   Oxygen Re-Evaluation:   Oxygen Discharge (Final Oxygen Re-Evaluation):   Initial Exercise Prescription:  Initial Exercise Prescription - 09/05/22 1400       Date of Initial Exercise RX and Referring Provider   Date 09/05/22    Referring Provider Jacqueline Schultz, MD    Expected Discharge Date 11/17/22      Recumbant Bike   Level 1.5    RPM 60    Watts 25    Minutes 15    METs 2.6      NuStep   Level 2    SPM 75    Minutes 15    METs 2.6      Prescription Details   Frequency (times per week) 3    Duration Progress to 30 minutes of continuous aerobic without signs/symptoms of physical distress      Intensity   THRR 40-80% of Max Heartrate 59-118    Ratings of Perceived Exertion 11-13    Perceived Dyspnea 0-4      Progression   Progression Continue progressive overload as per policy without signs/symptoms or physical distress.      Resistance Training   Training Prescription Yes    Weight 2 lbs    Reps 10-15             Perform Capillary Blood Glucose checks as needed.  Exercise  Prescription Changes:   Exercise Prescription Changes     Row Name 09/11/22 1706 09/27/22 1600           Response to Exercise   Blood Pressure (Admit) 128/70 144/80      Blood Pressure (Exercise) 170/88 144/82      Blood Pressure (Exit) 132/68 122/80      [name] Rate (Admit) 76 bpm 100 bpm      [name] Rate (Exercise) 102 bpm 121 bpm      [name] Rate (Exit) 85 bpm 100 bpm      Rating of Perceived Exertion (Exercise) 12 13      Perceived Dyspnea (Exercise) 0 0      Symptoms high BP, but resolved with rest none      Comments Pt first day in the [facility] Reviewed METs and Goals      Duration Progress to 30 minutes of  aerobic without signs/symptoms of physical distress Progress to 30 minutes of  aerobic without signs/symptoms of physical distress      Intensity THRR unchanged THRR unchanged  Progression   Progression Continue to progress workloads to maintain intensity without signs/symptoms of physical distress. Continue to progress workloads to maintain intensity without signs/symptoms of physical distress.      Average METs 1.75 1.9        Resistance Training   Training Prescription Yes No  no wts on wed      Weight 2 lbs --      Reps 10-15 --      Time 10 Minutes --        Recumbant Bike   Level 1.5 2      Watts -- 10      Minutes 15 15      METs 1.7 1.8        NuStep   Level 2 2      SPM 63 91      Minutes 15 15      METs 1.8 2               Exercise Comments:   Exercise Comments     Row Name 09/11/22 1714 09/27/22 1700         Exercise Comments Pt first day in the [facility] program. Pt tolerated exercise well with an average MET level of 1.75. Pt is learning her THRR, RPE and ExRx Reviewed METs and goals with pt. Pt has increased her avg MET level to 1.9 and is making steady progress towards her goals. Pt is enjoying the program so far, will continue to progress workloads as tolerated without s/sx.               Exercise Goals and  Review:   Exercise Goals     Row Name 09/05/22 1426             Exercise Goals   Increase Physical Activity Yes       Intervention Provide advice, education, support and counseling about physical activity/exercise needs.;Develop an individualized exercise prescription for aerobic and resistive training based on initial evaluation findings, risk stratification, comorbidities and participant's personal goals.       Expected Outcomes Short Term: Attend rehab on a regular basis to increase amount of physical activity.;Long Term: Add in home exercise to make exercise part of routine and to increase amount of physical activity.;Long Term: Exercising regularly at least 3-5 days a week.       Increase Strength and Stamina Yes       Intervention Provide advice, education, support and counseling about physical activity/exercise needs.;Develop an individualized exercise prescription for aerobic and resistive training based on initial evaluation findings, risk stratification, comorbidities and participant's personal goals.       Expected Outcomes Short Term: Increase workloads from initial exercise prescription for resistance, speed, and METs.;Short Term: Perform resistance training exercises routinely during rehab and add in resistance training at home;Long Term: Improve cardiorespiratory fitness, muscular endurance and strength as measured by increased METs and functional capacity ( )       Able to understand and use rate of perceived exertion (RPE) scale Yes       Intervention Provide education and explanation on how to use RPE scale       Expected Outcomes Short Term: Able to use RPE daily in rehab to express subjective intensity level;Long Term:  Able to use RPE to guide intensity level when exercising independently       Knowledge and understanding of Target [name] Rate Range (THRR) Yes       Intervention Provide education and  explanation of THRR including how the numbers were predicted and where  they are located for reference       Expected Outcomes Short Term: Able to state/look up THRR;Short Term: Able to use daily as guideline for intensity in rehab;Long Term: Able to use THRR to govern intensity when exercising independently       Understanding of Exercise Prescription Yes       Intervention Provide education, explanation, and written materials on patient's individual exercise prescription       Expected Outcomes Short Term: Able to explain program exercise prescription;Long Term: Able to explain home exercise prescription to exercise independently                Exercise Goals Re-Evaluation :  Exercise Goals Re-Evaluation     Row Name 09/11/22 1710 09/27/22 1656           Exercise Goal Re-Evaluation   Exercise Goals Review Increase Physical Activity;Understanding of Exercise Prescription;Increase Strength and Stamina;Knowledge and understanding of Target [name] Rate Range (THRR);Able to understand and use rate of perceived exertion (RPE) scale Increase Physical Activity;Understanding of Exercise Prescription;Increase Strength and Stamina;Knowledge and understanding of Target [name] Rate Range (THRR);Able to understand and use rate of perceived exertion (RPE) scale      Comments Pt first day in the [facility] program. Pt tolerated exercise well with an average MET level of 1.75. Pt is learning her THRR, RPE and ExRx Reviewed METs and goals with pt today. Pt is tolerating exercise well with an avg MET level of 1.9. Discussed with pt increasing RPMs on recumbant bike, however pt is limited by chronic knee pain. Pt said she is willing to try next session, but discussed switching equipment if knee pain persist or worsens. Pt feels good about her goals of increasing energy, travelling again, and building confidence. Pt states that exercising has helped her stress levels and she very much enjoys coming to class because she is having fun and feeling stronger.      Expected Outcomes Will  continue to monitor pt and progress WL as tolerated without sign or symptom Will continue to progress workloads as tolerated without s/sx.               Discharge Exercise Prescription (Final Exercise Prescription Changes):  Exercise Prescription Changes - 09/27/22 1600       Response to Exercise   Blood Pressure (Admit) 144/80    Blood Pressure (Exercise) 144/82    Blood Pressure (Exit) 122/80    [name] Rate (Admit) 100 bpm    [name] Rate (Exercise) 121 bpm    [name] Rate (Exit) 100 bpm    Rating of Perceived Exertion (Exercise) 13    Perceived Dyspnea (Exercise) 0    Symptoms none    Comments Reviewed METs and Goals    Duration Progress to 30 minutes of  aerobic without signs/symptoms of physical distress    Intensity THRR unchanged      Progression   Progression Continue to progress workloads to maintain intensity without signs/symptoms of physical distress.    Average METs 1.9      Resistance Training   Training Prescription No   no wts on wed     Recumbant Bike   Level 2    Watts 10    Minutes 15    METs 1.8      NuStep   Level 2    SPM 91    Minutes 15    METs 2  Nutrition:  Target Goals: Understanding of nutrition guidelines, daily intake of sodium 1500mg , cholesterol 200mg , calories 30% from fat and 7% or less from saturated fats, daily to have 5 or more servings of fruits and vegetables.  Biometrics:  Pre Biometrics - 09/05/22 1146       Pre Biometrics   Waist Circumference 27.75 inches    Hip Circumference 38.5 inches    Waist to Hip Ratio 0.72 %    Triceps Skinfold 42 mm    % Body Fat 38.6 %    Grip Strength 79 kg    Flexibility 14.75 in    Single Leg Stand 6.87 seconds              Nutrition Therapy Plan and Nutrition Goals:  Nutrition Therapy & Goals - 09/11/22 1620       Nutrition Therapy   Diet [name] Healthy Diet    Drug/Food Interactions Statins/Certain Fruits      Personal Nutrition Goals   Nutrition Goal  Patient to identify strategies for reducing cardiovascular risk by attending the [facility] education and nutrition series weekly.    Personal Goal #2 Patient to improve diet quality by using the plate method as a guide for meal planning to include lean protein/plant protein, fruits, vegetables, whole grains, nonfat dairy as part of a well-balanced diet.    Personal Goal #3 Patient to reduce sodium intake to 1500mg  per day    Comments Answered many questions regarding kidney function and nutrition; she does plan to follow-up with nephrology.  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 Patient will benefit from participation in intensive cardiac rehab for nutrition, exercise, and lifestyle modification.      Intervention Plan   Intervention Prescribe, educate and counsel regarding individualized specific dietary modifications aiming towards targeted core components such as weight, hypertension, lipid management, diabetes, [name] failure and other comorbidities.;Nutrition handout(s) given to patient.    Expected Outcomes Short Term Goal: Understand basic principles of dietary content, such as calories, fat, sodium, cholesterol and nutrients.;Long Term Goal: Adherence to prescribed nutrition plan.             Nutrition Assessments:  MEDIFICTS Score Key: ?70 Need to make dietary changes  40-70 [name] Healthy Diet ? 40 Therapeutic Level Cholesterol Diet    Picture Your Plate Scores: <16 Unhealthy dietary pattern with much room for improvement. 41-50 Dietary pattern unlikely to meet recommendations for good [name] and room for improvement. 51-60 More healthful dietary pattern, with some room for improvement.  >60 Healthy dietary pattern, although there may be some specific behaviors that could be improved.    Nutrition Goals Re-Evaluation:  Nutrition Goals Re-Evaluation     Row Name 09/11/22 1620             Goals   Current Weight 146 lb 9.7 oz (66.5 kg)       Comment  triglycerides 188, A1c 6.3, LipoproteinA 169.8       Expected Outcome Answered many questions regarding kidney function and nutrition; she does plan to follow-up with nephrology. 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 Patient will benefit from participation in intensive cardiac rehab for nutrition, exercise, and lifestyle modification.                Nutrition Goals Re-Evaluation:  Nutrition Goals Re-Evaluation     Row Name 09/11/22 1620             Goals   Current Weight 146 lb  9.7 oz (66.5 kg)       Comment triglycerides 188, A1c 6.3, LipoproteinA 169.8       Expected Outcome Answered many questions regarding kidney function and nutrition; she does plan to follow-up with nephrology. 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 Patient will benefit from participation in intensive cardiac rehab for nutrition, exercise, and lifestyle modification.                Nutrition Goals Discharge (Final Nutrition Goals Re-Evaluation):  Nutrition Goals Re-Evaluation - 09/11/22 1620       Goals   Current Weight 146 lb 9.7 oz (66.5 kg)    Comment triglycerides 188, A1c 6.3, LipoproteinA 169.8    Expected Outcome Answered many questions regarding kidney function and nutrition; she does plan to follow-up with nephrology. 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 Patient will benefit from participation in intensive cardiac rehab for nutrition, exercise, and lifestyle modification.             Psychosocial: Target Goals: Acknowledge presence or absence of significant depression and/or stress, maximize coping skills, provide positive support system. Participant is able to verbalize types and ability to use techniques and skills needed for reducing stress and depression.  Initial Review & Psychosocial Screening:  Initial Psych Review & Screening - 09/05/22 1153       Initial Review   Current issues with History of Depression;Current  Stress Concerns;Current Depression;Current Sleep Concerns    Source of Stress Concerns Chronic Illness;Family    Comments Myracle says she is experiencing some depression as her husband has cancer. Sherrlyn has anxiety that she takes medication for. Maedean says she is in the process of receiving counseling , which has not been set up yet.      Family Dynamics   Good Support System? Yes   Jamicka has her husband, 3 children, and daughter in law for support     Barriers   Psychosocial barriers to participate in program The patient should benefit from training in stress management and relaxation.;There are no identifiable barriers or psychosocial needs.      Screening Interventions   Interventions Encouraged to exercise;Provide feedback about the scores to participant    Expected Outcomes Long Term Goal: Stressors or current issues are controlled or eliminated.;Short Term goal: Identification and review with participant of any Quality of Life or Depression concerns found by scoring the questionnaire.;Long Term goal: The participant improves quality of Life and PHQ9 Scores as seen by post scores and/or verbalization of changes             Quality of Life Scores:  Quality of Life - 09/05/22 1432       Quality of Life   Select Quality of Life      Quality of Life Scores   [name]/Function Pre 19.29 %    Socioeconomic Pre 23 %    Psych/Spiritual Pre 23.14 %    Family Pre 28.8 %    GLOBAL Pre 22.31 %            Scores of 19 and below usually indicate a poorer quality of life in these areas.  A difference of  2-3 points is a clinically meaningful difference.  A difference of 2-3 points in the total score of the Quality of Life Index has been associated with significant improvement in overall quality of life, self-image, physical symptoms, and general [name] in studies assessing change in quality of life.  PHQ-9: Review  Jacqueline       09/05/2022  Depression screen PHQ 2/9  Decreased  Interest 1  Down, Depressed, Hopeless 2  PHQ - 2 Score 3  Altered sleeping 3  Tired, decreased energy 2  Change in appetite 2  Feeling bad or failure about yourself  1  Trouble concentrating 1  Moving slowly or fidgety/restless 1  Suicidal thoughts 0  PHQ-9 Score 13  Difficult doing work/chores Somewhat difficult   Interpretation of Total Score  Total Score Depression Severity:  1-4 = Minimal depression, 5-9 = Mild depression, 10-14 = Moderate depression, 15-19 = Moderately severe depression, 20-27 = Severe depression   Psychosocial Evaluation and Intervention:   Psychosocial Re-Evaluation:  Psychosocial Re-Evaluation     Row Name 09/11/22 1701 10/04/22 1041           Psychosocial Re-Evaluation   Current issues with History of Depression;Current Depression;Current Stress Concerns;Current Sleep Concerns History of Depression;Current Depression;Current Stress Concerns;Current Sleep Concerns      Comments Lulah did not voice any increased concerns or stressors on her first day of exerciee. Will reivew QOL this week Quality of life questionnaire review. Patient's PCP, Dr Tiburcio Pea office notified that Tyna is intersted in receiving counselling they gave her a list of providers. Nimsi says her chest soreness is getting better. Cairra admits to experiencing some depression after her MI/ Stenting.      Expected Outcomes Akeena will have decreased or controlled depression, stress cooncerns upon compleition of intensive cardiac rehab Analya will have decreased or controlled depression, stress cooncerns upon compleition of intensive cardiac rehab      Interventions Encouraged to attend Cardiac Rehabilitation for the exercise;Stress management education;Relaxation education Encouraged to attend Cardiac Rehabilitation for the exercise;Stress management education;Relaxation education      Continue Psychosocial Services  Follow up required by staff Follow up required by staff        Initial Review    Source of Stress Concerns Chronic Illness;Family Chronic Illness;Family      Comments Will continue to monitor and offer support as needed Will continue to monitor and offer support as needed               Psychosocial Discharge (Final Psychosocial Re-Evaluation):  Psychosocial Re-Evaluation - 10/04/22 1041       Psychosocial Re-Evaluation   Current issues with History of Depression;Current Depression;Current Stress Concerns;Current Sleep Concerns    Comments Quality of life questionnaire review. Patient's PCP, Dr Tiburcio Pea office notified that Rosina is intersted in receiving counselling they gave her a list of providers. Almedina says her chest soreness is getting better. Kyler admits to experiencing some depression after her MI/ St[place].    Expected Outcomes Jesse will have decreased or controlled depression, stress cooncerns upon compleition of intensive cardiac rehab    Interventions Encouraged to attend Cardiac Rehabilitation for the exercise;Stress management education;Relaxation education    Continue Psychosocial Services  Follow up required by staff      Initial Review   Source of Stress Concerns Chronic Illness;Family    Comments Will continue to monitor and offer support as needed             Vocational Rehabilitation: Provide vocational rehab assistance to qualifying candidates.   Vocational Rehab Evaluation & Intervention:  Vocational Rehab - 09/05/22 1203       Initial Vocational Rehab Evaluation & Intervention   Assessment shows need for Vocational Rehabilitation No   Lorenzo is retired and does not need vocational rehab at this time  Education: Education Goals: Education classes will be provided on a weekly basis, covering required topics. Participant will state understanding/return demonstration of topics presented.    Education     Row Name 09/11/22 1600     Education   Cardiac Education Topics Pritikin   Firefighter Nutrition   Nutrition Workshop Fueling a Forensic psychologist   Instruction Review Code 1- Teaching laboratory technician Start Time 1400   Class Stop Time 1453   Class Time Calculation (min) 53 min    Row Name 09/13/22 1500     Education   Cardiac Education Topics Pritikin   Customer service manager   Weekly Topic Simple Sides and Sauces   Instruction Review Code 1- Verbalizes Understanding   Class Start Time 1350   Class Stop Time 1425   Class Time Calculation (min) 35 min    Row Name 09/15/22 1500     Education   Cardiac Education Topics Pritikin   Nurse, children's Exercise Physiologist   Select Psychosocial   Psychosocial How Our Thoughts Can Heal Our Hearts   Instruction Review Code 1- Verbalizes Understanding   Class Start Time 1400   Class Stop Time 1445   Class Time Calculation (min) 45 min    Row Name 09/18/22 1600     Education   Cardiac Education Topics Pritikin   Technical brewer Psychosocial   Psychosocial Workshop From Head to [name]: The Power of a Healthy Outlook   Instruction Review Code 1- Verbalizes Understanding   Class Start Time 1405   Class Stop Time 1502   Class Time Calculation (min) 57 min    Row Name 09/27/22 1500     Education   Cardiac Education Topics Pritikin   Orthoptist   Educator Dietitian   Weekly Topic Adding Flavor - Sodium-Free   Instruction Review Code 1- Verbalizes Understanding   Class Start Time 1400   Class Stop Time 1445   Class Time Calculation (min) 45 min    Row Name 09/29/22 1600     Education   Cardiac Education Topics Pritikin  Simultaneous filing. User may not have seen previous data.   Select Core Videos  Simultaneous filing. User may not have seen previous data.     Core  Videos   Educator Nurse  Simultaneous filing. User may not have seen previous data.   Select General Education  Simultaneous filing. User may not have seen previous data.   General Education [name] Disease Risk Reduction   Instruction Review Code 1- Verbalizes Understanding  Simultaneous filing. User may not have seen previous data.   Class Start Time 1405  Simultaneous filing. User may not have seen previous data.   Class Stop Time 1450  Simultaneous filing. User may not have seen previous data.   Class Time Calculation (min) 45 min  Simultaneous filing. User may not have seen previous data.    Row Name 10/02/22 1600     Education   Cardiac Education Topics Pritikin   Geographical information systems officer Exercise   Exercise Workshop Location manager  and Fall Prevention   Instruction Review Code 1- Verbalizes Understanding   Class Start Time 1405   Class Stop Time 1445   Class Time Calculation (min) 40 min            Core Videos: Exercise    Move It!  Clinical staff conducted group or individual video education with verbal and written material and guidebook.  Patient learns the recommended Pritikin exercise program. Exercise with the goal of living a long, healthy life. Some of the [name] benefits of exercise include controlled diabetes, healthier blood pressure levels, improved cholesterol levels, improved [name] and lung capacity, improved sleep, and better body composition. Everyone should speak with their doctor before starting or changing an exercise routine.  Biomechanical Limitations Clinical staff conducted group or individual video education with verbal and written material and guidebook.  Patient learns how biomechanical limitations can impact exercise and how we can mitigate and possibly overcome limitations to have an impactful and balanced exercise routine.  Body Composition Clinical staff conducted group or individual video  education with verbal and written material and guidebook.  Patient learns that body composition (ratio of muscle mass to fat mass) is a key component to assessing overall fitness, rather than body weight alone. Increased fat mass, especially visceral belly fat, can put [place] at increased risk for metabolic syndrome, type 2 diabetes, [name] disease, and even death. It is recommended to combine diet and exercise (cardiovascular and resistance training) to improve your body composition. Seek guidance from your physician and exercise physiologist before implementing an exercise routine.  Exercise Action Plan Clinical staff conducted group or individual video education with verbal and written material and guidebook.  Patient learns the recommended strategies to achieve and enjoy long-term exercise adherence, including variety, self-motivation, self-efficacy, and positive decision making. Benefits of exercise include fitness, good [name], weight management, more energy, better sleep, less stress, and overall well-being.  Medical   [name] Disease Risk Reduction Clinical staff conducted group or individual video education with verbal and written material and guidebook.  Patient learns our [name] is our most vital organ as it circulates oxygen, nutrients, white blood cells, and hormones throughout the entire body, and carries waste away. Data supports a plant-based eating plan like the Pritikin Program for its effectiveness in slowing progression of and reversing [name] disease. The video provides a number of recommendations to address [name] disease.   Metabolic Syndrome and Belly Fat  Clinical staff conducted group or individual video education with verbal and written material and guidebook.  Patient learns what metabolic syndrome is, how it leads to [name] disease, and how one can reverse it and keep it from coming back. You have metabolic syndrome if you have 3 of the following 5 criteria: abdominal obesity, high  blood pressure, high triglycerides, low HDL cholesterol, and high blood sugar.  Hypertension and [name] Disease Clinical staff conducted group or individual video education with verbal and written material and guidebook.  Patient learns that high blood pressure, or hypertension, is very common in the [place]. Hypertension is largely due to excessive salt intake, but other important risk factors include being overweight, physical inactivity, drinking too much alcohol, smoking, and not eating enough potassium from fruits and vegetables. High blood pressure is a leading risk factor for [name] attack, stroke, congestive [name] failure, dementia, kidney failure, and premature death. Long-term effects of excessive salt intake include stiffening of the arteries and thickening of [name] muscle and organ damage. Recommendations include ways to reduce hypertension and  the risk of [name] disease.  Diseases of Our Time - Focusing on Diabetes Clinical staff conducted group or individual video education with verbal and written material and guidebook.  Patient learns why the best way to stop diseases of our time is prevention, through food and other lifestyle changes. Medicine (such as prescription pills and surgeries) is often only a Band-Aid on the problem, not a long-term solution. Most common diseases of our time include obesity, type 2 diabetes, hypertension, [name] disease, and cancer. The Pritikin Program is recommended and has been proven to help reduce, reverse, and/or prevent the damaging effects of metabolic syndrome.  Nutrition   Overview of the Pritikin Eating Plan  Clinical staff conducted group or individual video education with verbal and written material and guidebook.  Patient learns about the Pritikin Eating Plan for disease risk reduction. The Pritikin Eating Plan emphasizes a wide variety of unrefined, minimally-processed carbohydrates, like fruits, vegetables, whole grains, and legumes. Go,  Caution, and Stop food choices are explained. Plant-based and lean animal proteins are emphasized. Rationale provided for low sodium intake for blood pressure control, low added sugars for blood sugar stabilization, and low added fats and oils for coronary artery disease risk reduction and weight management.  Calorie Density  Clinical staff conducted group or individual video education with verbal and written material and guidebook.  Patient learns about calorie density and how it impacts the Pritikin Eating Plan. Knowing the characteristics of the food you choose will help you decide whether those foods will lead to weight gain or weight loss, and whether you want to consume more or less of them. Weight loss is usually a side effect of the Pritikin Eating Plan because of its focus on low calorie-dense foods.  Label Reading  Clinical staff conducted group or individual video education with verbal and written material and guidebook.  Patient learns about the Pritikin recommended label reading guidelines and corresponding recommendations regarding calorie density, added sugars, sodium content, and whole grains.  Dining Out - Part 1  Clinical staff conducted group or individual video education with verbal and written material and guidebook.  Patient learns that restaurant meals can be sabotaging because they can be so high in calories, fat, sodium, and/or sugar. Patient learns recommended strategies on how to positively address this and avoid unhealthy pitfalls.  Facts on Fats  Clinical staff conducted group or individual video education with verbal and written material and guidebook.  Patient learns that lifestyle modifications can be just as effective, if not more so, as many medications for lowering your risk of [name] disease. A Pritikin lifestyle can help to reduce your risk of inflammation and atherosclerosis (cholesterol build-up, or plaque, in the artery walls). Lifestyle interventions such as  dietary choices and physical activity address the cause of atherosclerosis. A review of the types of fats and their impact on blood cholesterol levels, along with dietary recommendations to reduce fat intake is also included.  Nutrition Action Plan  Clinical staff conducted group or individual video education with verbal and written material and guidebook.  Patient learns how to incorporate Pritikin recommendations into their lifestyle. Recommendations include planning and keeping personal [name] goals in mind as an important part of their success.  Healthy Mind-Set    Healthy Minds, Bodies, Hearts  Clinical staff conducted group or individual video education with verbal and written material and guidebook.  Patient learns how to identify when they are stressed. Video will discuss the impact of that stress, as well as the many  benefits of stress management. Patient will also be introduced to stress management techniques. The way we think, act, and feel has an impact on our hearts.  How Our Thoughts Can Heal Our Hearts  Clinical staff conducted group or individual video education with verbal and written material and guidebook.  Patient learns that negative thoughts can cause depression and anxiety. This can result in negative lifestyle behavior and serious [name] problems. Cognitive behavioral therapy is an effective method to help control our thoughts in order to change and improve our emotional outlook.  Additional Videos:  Exercise    Improving Performance  Clinical staff conducted group or individual video education with verbal and written material and guidebook.  Patient learns to use a non-linear approach by alternating intensity levels and lengths of time spent exercising to help burn more calories and lose more body fat. Cardiovascular exercise helps improve [name] [name], metabolism, hormonal balance, blood sugar control, and recovery from fatigue. Resistance training improves strength,  endurance, balance, coordination, reaction time, metabolism, and muscle mass. Flexibility exercise improves circulation, posture, and balance. Seek guidance from your physician and exercise physiologist before implementing an exercise routine and learn your capabilities and proper form for all exercise.  Introduction to Yoga  Clinical staff conducted group or individual video education with verbal and written material and guidebook.  Patient learns about yoga, a discipline of the coming together of mind, breath, and body. The benefits of yoga include improved flexibility, improved range of motion, better posture and core strength, increased lung function, weight loss, and positive self-image. Yoga's [name] [name] benefits include lowered blood pressure, healthier [name] rate, decreased cholesterol and triglyceride levels, improved immune function, and reduced stress. Seek guidance from your physician and exercise physiologist before implementing an exercise routine and learn your capabilities and proper form for all exercise.  Medical   Aging: Enhancing Your Quality of Life  Clinical staff conducted group or individual video education with verbal and written material and guidebook.  Patient learns key strategies and recommendations to stay in good physical [name] and enhance quality of life, such as prevention strategies, having an advocate, securing a [name] Care Proxy and Power of Attorney, and keeping a list of medications and system for tracking them. It also discusses how to avoid risk for bone loss.  Biology of Weight Control  Clinical staff conducted group or individual video education with verbal and written material and guidebook.  Patient learns that weight gain occurs because we consume more calories than we burn (eating more, moving less). Even if your body weight is normal, you may have higher ratios of fat compared to muscle mass. Too much body fat puts you at increased risk for  cardiovascular disease, [name] attack, stroke, type 2 diabetes, and obesity-related cancers. In addition to exercise, following the Pritikin Eating Plan can help reduce your risk.  Decoding Lab Results  Clinical staff conducted group or individual video education with verbal and written material and guidebook.  Patient learns that lab test reflects one measurement whose values change over time and are influenced by many factors, including medication, stress, sleep, exercise, food, hydration, pre-existing medical conditions, and more. It is recommended to use the knowledge from this video to become more involved with your lab results and evaluate your numbers to speak with your doctor.   Diseases of Our Time - Overview  Clinical staff conducted group or individual video education with verbal and written material and guidebook.  Patient learns that according to the CDC, 50% to  70% of chronic diseases (such as obesity, type 2 diabetes, elevated lipids, hypertension, and [name] disease) are avoidable through lifestyle improvements including healthier food choices, listening to satiety cues, and increased physical activity.  Sleep Disorders Clinical staff conducted group or individual video education with verbal and written material and guidebook.  Patient learns how good quality and duration of sleep are important to overall [name] and well-being. Patient also learns about sleep disorders and how they impact [name] along with recommendations to address them, including discussing with a physician.  Nutrition  Dining Out - Part 2 Clinical staff conducted group or individual video education with verbal and written material and guidebook.  Patient learns how to plan ahead and communicate in order to maximize their dining experience in a healthy and nutritious manner. Included are recommended food choices based on the type of restaurant the patient is visiting.   Fueling a Banker  conducted group or individual video education with verbal and written material and guidebook.  There is a strong connection between our food choices and our [name]. Diseases like obesity and type 2 diabetes are very prevalent and are in large-part due to lifestyle choices. The Pritikin Eating Plan provides plenty of food and hunger-curbing satisfaction. It is easy to follow, affordable, and helps reduce [name] risks.  Menu Workshop  Clinical staff conducted group or individual video education with verbal and written material and guidebook.  Patient learns that restaurant meals can sabotage [name] goals because they are often packed with calories, fat, sodium, and sugar. Recommendations include strategies to plan ahead and to communicate with the manager, chef, or server to help order a healthier meal.  Planning Your Eating Strategy  Clinical staff conducted group or individual video education with verbal and written material and guidebook.  Patient learns about the Pritikin Eating Plan and its benefit of reducing the risk of disease. The Pritikin Eating Plan does not focus on calories. Instead, it emphasizes high-quality, nutrient-rich foods. By knowing the characteristics of the foods, we choose, we can determine their calorie density and make informed decisions.  Targeting Your Nutrition Priorities  Clinical staff conducted group or individual video education with verbal and written material and guidebook.  Patient learns that lifestyle habits have a tremendous impact on disease risk and progression. This video provides eating and physical activity recommendations based on your personal [name] goals, such as reducing LDL cholesterol, losing weight, preventing or controlling type 2 diabetes, and reducing high blood pressure.  Vitamins and Minerals  Clinical staff conducted group or individual video education with verbal and written material and guidebook.  Patient learns different ways to obtain  key vitamins and minerals, including through a recommended healthy diet. It is important to discuss all supplements you take with your doctor.   Healthy Mind-Set    Smoking Cessation  Clinical staff conducted group or individual video education with verbal and written material and guidebook.  Patient learns that cigarette smoking and tobacco addiction pose a serious [name] risk which affects millions of people. Stopping smoking will significantly reduce the risk of [name] disease, lung disease, and many forms of cancer. Recommended strategies for quitting are covered, including working with your doctor to develop a successful plan.  Culinary   Becoming a Set designer conducted group or individual video education with verbal and written material and guidebook.  Patient learns that cooking at home can be healthy, cost-effective, quick, and puts them in control. Keys to cooking healthy recipes  will include looking at your recipe, assessing your equipment needs, planning ahead, making it simple, choosing cost-effective seasonal ingredients, and limiting the use of added fats, salts, and sugars.  Cooking - Breakfast and Snacks  Clinical staff conducted group or individual video education with verbal and written material and guidebook.  Patient learns how important breakfast is to satiety and nutrition through the entire day. Recommendations include key foods to eat during breakfast to help stabilize blood sugar levels and to prevent overeating at meals later in the day. Planning ahead is also a key component.  Cooking - Educational psychologist conducted group or individual video education with verbal and written material and guidebook.  Patient learns eating strategies to improve overall [name], including an approach to cook more at home. Recommendations include thinking of animal protein as a side on your plate rather than center stage and focusing instead on lower calorie  dense options like vegetables, fruits, whole grains, and plant-based proteins, such as beans. Making sauces in large quantities to freeze for later and leaving the skin on your vegetables are also recommended to maximize your experience.  Cooking - Healthy Salads and Dressing Clinical staff conducted group or individual video education with verbal and written material and guidebook.  Patient learns that vegetables, fruits, whole grains, and legumes are the foundations of the Pritikin Eating Plan. Recommendations include how to incorporate each of these in flavorful and healthy salads, and how to create homemade salad dressings. Proper handling of ingredients is also covered. Cooking - Soups and [employer] - Soups and Desserts Clinical staff conducted group or individual video education with verbal and written material and guidebook.  Patient learns that Pritikin soups and desserts make for easy, nutritious, and delicious snacks and meal components that are low in sodium, fat, sugar, and calorie density, while high in vitamins, minerals, and filling fiber. Recommendations include simple and healthy ideas for soups and desserts.   Overview     The Pritikin Solution Program Overview Clinical staff conducted group or individual video education with verbal and written material and guidebook.  Patient learns that the results of the Pritikin Program have been documented in more than 100 articles published in peer-reviewed journals, and the benefits include reducing risk factors for (and, in some cases, even reversing) high cholesterol, high blood pressure, type 2 diabetes, obesity, and more! An overview of the three key pillars of the Pritikin Program will be covered: eating well, doing regular exercise, and having a healthy mind-set.  WORKSHOPS  Exercise: Exercise Basics: Building Your Action Plan Clinical staff led group instruction and group discussion with PowerPoint presentation and patient  guidebook. To enhance the learning environment the use of posters, models and videos may be added. At the conclusion of this workshop, patients will comprehend the difference between physical activity and exercise, as well as the benefits of incorporating both, into their routine. Patients will understand the FITT (Frequency, Intensity, Time, and Type) principle and how to use it to build an exercise action plan. In addition, safety concerns and other considerations for exercise and cardiac rehab will be addressed by the presenter. The purpose of this lesson is to promote a comprehensive and effective weekly exercise routine in order to improve patients' overall level of fitness.   Managing [name] Disease: Your Path to a Healthier [name] Clinical staff led group instruction and group discussion with PowerPoint presentation and patient guidebook. To enhance the learning environment the use of posters, models and videos  may be added.At the conclusion of this workshop, patients will understand the anatomy and physiology of the [name]. Additionally, they will understand how Pritikin's three pillars impact the risk factors, the progression, and the management of [name] disease.  The purpose of this lesson is to provide a high-level overview of the [name], [name] disease, and how the Pritikin lifestyle positively impacts risk factors.  Exercise Biomechanics Clinical staff led group instruction and group discussion with PowerPoint presentation and patient guidebook. To enhance the learning environment the use of posters, models and videos may be added. Patients will learn how the structural parts of their bodies function and how these functions impact their daily activities, movement, and exercise. Patients will learn how to promote a neutral spine, learn how to manage pain, and identify ways to improve their physical movement in order to promote healthy living. The purpose of this lesson is to expose patients  to common physical limitations that impact physical activity. Participants will learn practical ways to adapt and manage aches and pains, and to minimize their effect on regular exercise. Patients will learn how to maintain good posture while sitting, walking, and lifting.  Balance Training and Fall Prevention  Clinical staff led group instruction and group discussion with PowerPoint presentation and patient guidebook. To enhance the learning environment the use of posters, models and videos may be added. At the conclusion of this workshop, patients will understand the importance of their sensorimotor skills (vision, proprioception, and the vestibular system) in maintaining their ability to balance as they age. Patients will apply a variety of balancing exercises that are appropriate for their current level of function. Patients will understand the common causes for poor balance, possible solutions to these problems, and ways to modify their physical environment in order to minimize their fall risk. The purpose of this lesson is to teach patients about the importance of maintaining balance as they age and ways to minimize their risk of falling.  WORKSHOPS   Nutrition:  Fueling a Ship broker led group instruction and group discussion with PowerPoint presentation and patient guidebook. To enhance the learning environment the use of posters, models and videos may be added. Patients will review the foundational principles of the Pritikin Eating Plan and understand what constitutes a serving size in each of the food groups. Patients will also learn Pritikin-friendly foods that are better choices when away from home and review make-ahead meal and snack options. Calorie density will be reviewed and applied to three nutrition priorities: weight maintenance, weight loss, and weight gain. The purpose of this lesson is to reinforce (in a group setting) the key concepts around what patients are  recommended to eat and how to apply these guidelines when away from home by planning and selecting Pritikin-friendly options. Patients will understand how calorie density may be adjusted for different weight management goals.  Mindful Eating  Clinical staff led group instruction and group discussion with PowerPoint presentation and patient guidebook. To enhance the learning environment the use of posters, models and videos may be added. Patients will briefly review the concepts of the Pritikin Eating Plan and the importance of low-calorie dense foods. The concept of mindful eating will be introduced as well as the importance of paying attention to internal hunger signals. Triggers for non-hunger eating and techniques for dealing with triggers will be explored. The purpose of this lesson is to provide patients with the opportunity to review the basic principles of the Pritikin Eating Plan, discuss the value of eating  mindfully and how to measure internal cues of hunger and fullness using the Hunger Scale. Patients will also discuss reasons for non-hunger eating and learn strategies to use for controlling emotional eating.  Targeting Your Nutrition Priorities Clinical staff led group instruction and group discussion with PowerPoint presentation and patient guidebook. To enhance the learning environment the use of posters, models and videos may be added. Patients will learn how to determine their genetic susceptibility to disease by reviewing their family history. Patients will gain insight into the importance of diet as part of an overall healthy lifestyle in mitigating the impact of genetics and other environmental insults. The purpose of this lesson is to provide patients with the opportunity to assess their personal nutrition priorities by looking at their family history, their own [name] history and current risk factors. Patients will also be able to discuss ways of prioritizing and modifying the Pritikin  Eating Plan for their highest risk areas  Menu  Clinical staff led group instruction and group discussion with PowerPoint presentation and patient guidebook. To enhance the learning environment the use of posters, models and videos may be added. Using menus brought in from [employer], or printed from [employer], patients will apply the Pritikin dining out guidelines that were presented in the [employer] video. Patients will also be able to practice these guidelines in a variety of provided scenarios. The purpose of this lesson is to provide patients with the opportunity to practice hands-on learning of the Pritikin Dining Out guidelines with actual menus and practice scenarios.  Label Reading Clinical staff led group instruction and group discussion with PowerPoint presentation and patient guidebook. To enhance the learning environment the use of posters, models and videos may be added. Patients will review and discuss the Pritikin label reading guidelines presented in [employer]'s Label Reading Educational series video. Using fool labels brought in from local grocery stores and markets, patients will apply the label reading guidelines and determine if the packaged food meet the Pritikin guidelines. The purpose of this lesson is to provide patients with the opportunity to review, discuss, and practice hands-on learning of the Pritikin Label Reading guidelines with actual packaged food labels. Cooking School  Pritikin's [employer] are designed to teach patients ways to prepare quick, simple, and affordable recipes at home. The importance of nutrition's role in chronic disease risk reduction is reflected in its emphasis in the overall Pritikin program. By learning how to prepare essential core Pritikin Eating Plan recipes, patients will increase control over what they eat; be able to customize the flavor of foods without the use of added salt, sugar, or fat; and  improve the quality of the food they consume. By learning a set of core recipes which are easily assembled, quickly prepared, and affordable, patients are more likely to prepare more healthy foods at home. These workshops focus on convenient breakfasts, simple entres, side dishes, and desserts which can be prepared with minimal effort and are consistent with nutrition recommendations for cardiovascular risk reduction. Cooking [employer] are taught by a Armed forces logistics/support/administrative officer (RD) who has been trained by the [employer]. The chef or RD has a clear understanding of the importance of minimizing - if not completely eliminating - added fat, sugar, and sodium in recipes. Throughout the series of Cooking School Workshop sessions, patients will learn about healthy ingredients and efficient methods of cooking to build confidence in their capability to prepare    [employer] weekly topics:  Adding Flavor- Sodium-Free  Fast and Healthy Breakfasts  Powerhouse Plant-Based Proteins  Satisfying Salads and Dressings  Simple Sides and Sauces  International Cuisine-Spotlight on the Blue Zones  Delicious Desserts  Savory Soups  Efficiency Cooking - Meals in a Snap  Tasty Appetizers and Snacks  Comforting Weekend Breakfasts  One-Pot Wonders   Fast Evening Meals  Landscape architect Your Pritikin Plate  WORKSHOPS   Healthy Mindset (Psychosocial):  Focused Goals, Sustainable Changes Clinical staff led group instruction and group discussion with PowerPoint presentation and patient guidebook. To enhance the learning environment the use of posters, models and videos may be added. Patients will be able to apply effective goal setting strategies to establish at least one personal goal, and then take consistent, meaningful action toward that goal. They will learn to identify common barriers to achieving personal goals and develop strategies to overcome them. Patients will  also gain an understanding of how our mind-set can impact our ability to achieve goals and the importance of cultivating a positive and growth-oriented mind-set. The purpose of this lesson is to provide patients with a deeper understanding of how to set and achieve personal goals, as well as the tools and strategies needed to overcome common obstacles which may arise along the way.  From Head to [name]: The Power of a Healthy Outlook  Clinical staff led group instruction and group discussion with PowerPoint presentation and patient guidebook. To enhance the learning environment the use of posters, models and videos may be added. Patients will be able to recognize and describe the impact of emotions and mood on physical [name]. They will discover the importance of self-care and explore self-care practices which may work for them. Patients will also learn how to utilize the 4 C's to cultivate a healthier outlook and better manage stress and challenges. The purpose of this lesson is to demonstrate to patients how a healthy outlook is an essential part of maintaining good [name], especially as they continue their cardiac rehab journey.  Healthy Sleep for a Healthy [name] Clinical staff led group instruction and group discussion with PowerPoint presentation and patient guidebook. To enhance the learning environment the use of posters, models and videos may be added. At the conclusion of this workshop, patients will be able to demonstrate knowledge of the importance of sleep to overall [name], well-being, and quality of life. They will understand the symptoms of, and treatments for, common sleep disorders. Patients will also be able to identify daytime and nighttime behaviors which impact sleep, and they will be able to apply these tools to help manage sleep-related challenges. The purpose of this lesson is to provide patients with a general overview of sleep and outline the importance of quality sleep. Patients will  learn about a few of the most common sleep disorders. Patients will also be introduced to the concept of "sleep hygiene," and discover ways to self-manage certain sleeping problems through simple daily behavior changes. Finally, the workshop will motivate patients by clarifying the links between quality sleep and their goals of [name]-healthy living.   Recognizing and Reducing Stress Clinical staff led group instruction and group discussion with PowerPoint presentation and patient guidebook. To enhance the learning environment the use of posters, models and videos may be added. At the conclusion of this workshop, patients will be able to understand the types of stress reactions, differentiate between acute and chronic stress, and recognize the impact that chronic stress has on their [name]. They will also be able to apply  different coping mechanisms, such as reframing negative self-talk. Patients will have the opportunity to practice a variety of stress management techniques, such as deep abdominal breathing, progressive muscle relaxation, and/or guided imagery.  The purpose of this lesson is to educate patients on the role of stress in their lives and to provide healthy techniques for coping with it.  Learning Barriers/Preferences:  Learning Barriers/Preferences - 09/05/22 1432       Learning Barriers/Preferences   Learning Barriers Sight    Learning Preferences Written Material;Computer/Internet             Education Topics:  Knowledge Questionnaire Score:  Knowledge Questionnaire Score - 09/05/22 1433       Knowledge Questionnaire Score   Pre Score 19/24             Core Components/Risk Factors/Patient Goals at Admission:  Personal Goals and Risk Factors at Admission - 09/05/22 1433       Core Components/Risk Factors/Patient Goals on Admission   Diabetes Yes    Intervention Provide education about signs/symptoms and action to take for hypo/hyperglycemia.;Provide education  about proper nutrition, including hydration, and aerobic/resistive exercise prescription along with prescribed medications to achieve blood glucose in normal ranges: Fasting glucose 65-99 mg/dL    Expected Outcomes Short Term: Participant verbalizes understanding of the signs/symptoms and immediate care of hyper/hypoglycemia, proper foot care and importance of medication, aerobic/resistive exercise and nutrition plan for blood glucose control.;Long Term: Attainment of HbA1C < 7%.    Hypertension Yes    Intervention Provide education on lifestyle modifcations including regular physical activity/exercise, weight management, moderate sodium restriction and increased consumption of fresh fruit, vegetables, and low fat dairy, alcohol moderation, and smoking cessation.;Monitor prescription use compliance.    Expected Outcomes Short Term: Continued assessment and intervention until BP is < 140/52mm HG in hypertensive participants. < 130/71mm HG in hypertensive participants with diabetes, [name] failure or chronic kidney disease.;Long Term: Maintenance of blood pressure at goal levels.    Lipids Yes    Intervention Provide education and support for participant on nutrition & aerobic/resistive exercise along with prescribed medications to achieve LDL 70mg , HDL >40mg .    Expected Outcomes Short Term: Participant states understanding of desired cholesterol values and is compliant with medications prescribed. Participant is following exercise prescription and nutrition guidelines.;Long Term: Cholesterol controlled with medications as prescribed, with individualized exercise RX and with personalized nutrition plan. Value goals: LDL < 70mg , HDL > 40 mg.    Stress Yes    Intervention Offer individual and/or small group education and counseling on adjustment to [name] disease, stress management and [name]-related lifestyle change. Teach and support self-help strategies.;Refer participants experiencing significant  psychosocial distress to appropriate mental [name] specialists for further evaluation and treatment. When possible, include family members and significant others in education/counseling sessions.    Expected Outcomes Short Term: Participant demonstrates changes in [name]-related behavior, relaxation and other stress management skills, ability to obtain effective social support, and compliance with psychotropic medications if prescribed.;Long Term: Emotional wellbeing is indicated by absence of clinically significant psychosocial distress or social isolation.             Core Components/Risk Factors/Patient Goals Review:   Goals and Risk Factor Review     Row Name 09/11/22 1705 10/04/22 1046           Core Components/Risk Factors/Patient Goals Review   Personal Goals Review Weight Management/Obesity;Lipids;Stress;Diabetes;Hypertension Weight Management/Obesity;Lipids;Stress;Diabetes;Hypertension      Review Raychelle started intensive cardiac rehab on 09/11/22 and did well  with exercise. Systolic BP elevation noted on the recumbent bike will continue to montior BP Sharnel is doing well with exercise at  intensive cardiac rehab. [place] has lost 2 kg since starting cardiac rehab.      Expected Outcomes Danaisa will continue to participate in intensive cardiac rehab for exercise, nutrition and lifestle modifications Aysia will continue to participate in intensive cardiac rehab for exercise, nutrition and lifestle modifications               Core Components/Risk Factors/Patient Goals at Discharge (Final Review):   Goals and Risk Factor Review - 10/04/22 1046       Core Components/Risk Factors/Patient Goals Review   Personal Goals Review Weight Management/Obesity;Lipids;Stress;Diabetes;Hypertension    Review Ameenah is doing well with exercise at  intensive cardiac rehab. Channie has lost 2 kg since starting cardiac rehab.    Expected Outcomes Yatzary will continue to participate in intensive cardiac  rehab for exercise, nutrition and lifestle modifications             ITP Comments:  ITP Comments     Row Name 09/05/22 1150 09/11/22 1658 10/04/22 1040       ITP Comments Dr Armanda Magic MD, Medical Director, Introduction to Pritikin Education Program/Intensive Cardiac Rehab 30 Day ITP Review. Tyrone started intensive cardiac rehab on 09/11/22 and did well with exercise 30 Day ITP Review. Kaashvi has good attendance and participation in intensive cardiac rehab              Comments: See ITP comments.[name] RN BSN

## 2022-10-06 ENCOUNTER — Encounter (HOSPITAL_COMMUNITY)
Admission: RE | Admit: 2022-10-06 | Discharge: 2022-10-06 | Disposition: A | Discharge status: Home / Self Care | Payer: Medicare Other | Source: Ambulatory Visit | Attending: Cardiology | Admitting: Cardiology

## 2022-10-06 DIAGNOSIS — I2111 ST elevation (STEMI) myocardial infarction involving right coronary artery: Secondary | ICD-10-CM

## 2022-10-06 DIAGNOSIS — Z955 Presence of coronary angioplasty implant and graft: Secondary | ICD-10-CM

## 2022-10-09 ENCOUNTER — Encounter (HOSPITAL_COMMUNITY)
Admission: RE | Admit: 2022-10-09 | Discharge: 2022-10-09 | Disposition: A | Discharge status: Home / Self Care | Payer: Medicare Other | Source: Ambulatory Visit | Attending: Cardiology | Admitting: Cardiology

## 2022-10-09 ENCOUNTER — Encounter (HOSPITAL_COMMUNITY): Payer: Medicare Other

## 2022-10-09 ENCOUNTER — Telehealth (HOSPITAL_COMMUNITY): Payer: Self-pay | Admitting: *Deleted

## 2022-10-09 DIAGNOSIS — I2111 ST elevation (STEMI) myocardial infarction involving right coronary artery: Secondary | ICD-10-CM | POA: Diagnosis not present

## 2022-10-09 DIAGNOSIS — Z955 Presence of coronary angioplasty implant and graft: Secondary | ICD-10-CM

## 2022-10-11 ENCOUNTER — Other Ambulatory Visit: Payer: Self-pay

## 2022-10-11 ENCOUNTER — Encounter (HOSPITAL_COMMUNITY)
Admission: RE | Admit: 2022-10-11 | Discharge: 2022-10-11 | Disposition: A | Discharge status: Home / Self Care | Payer: Medicare Other | Source: Ambulatory Visit | Attending: Cardiology | Admitting: Cardiology

## 2022-10-11 DIAGNOSIS — I2111 ST elevation (STEMI) myocardial infarction involving right coronary artery: Secondary | ICD-10-CM | POA: Diagnosis not present

## 2022-10-11 DIAGNOSIS — Z955 Presence of coronary angioplasty implant and graft: Secondary | ICD-10-CM

## 2022-10-11 MED ORDER — CARVEDILOL 6.25 MG PO TABS: 6.2500 mg | ORAL_TABLET | Freq: Two times a day (BID) | ORAL | 3 refills | Status: DC

## 2022-10-11 NOTE — Progress Notes (Signed)
CARDIAC REHAB PHASE 2  Reviewed home exercise with pt today. Pt is tolerating exercise well. Pt will continue to exercise on her own by walking and using her stepper at home for 30-45 minutes per session 3-4 days a week in addition to the 3 days in CRP2. Advised pt on THRR, RPE scale, hydration and temperature/humidity precautions. Reinforced NTG use, S/S to stop exercise and when to call MD vs 911. Encouraged warm up cool down and stretches with exercise sessions. Pt verbalized understanding, all questions were answered and pt was given a copy to take home.    Harrie Jeans ACSM-CEP 10/11/2022 5:01 PM

## 2022-10-12 NOTE — Addendum Note (Signed)
Addended by: Franchot Gallo on: 10/12/2022 09:50 AM   Modules accepted: Orders

## 2022-10-13 ENCOUNTER — Encounter (HOSPITAL_COMMUNITY)
Admission: RE | Admit: 2022-10-13 | Discharge: 2022-10-13 | Disposition: A | Discharge status: Home / Self Care | Payer: Medicare Other | Source: Ambulatory Visit | Attending: Cardiology | Admitting: Cardiology

## 2022-10-13 DIAGNOSIS — Z955 Presence of coronary angioplasty implant and graft: Secondary | ICD-10-CM

## 2022-10-13 DIAGNOSIS — I2111 ST elevation (STEMI) myocardial infarction involving right coronary artery: Secondary | ICD-10-CM

## 2022-10-16 ENCOUNTER — Encounter (HOSPITAL_COMMUNITY)
Admission: RE | Admit: 2022-10-16 | Discharge: 2022-10-16 | Disposition: A | Discharge status: Home / Self Care | Payer: Medicare Other | Source: Ambulatory Visit | Attending: Cardiology | Admitting: Cardiology

## 2022-10-16 DIAGNOSIS — I2111 ST elevation (STEMI) myocardial infarction involving right coronary artery: Secondary | ICD-10-CM | POA: Diagnosis not present

## 2022-10-16 DIAGNOSIS — Z955 Presence of coronary angioplasty implant and graft: Secondary | ICD-10-CM

## 2022-10-18 ENCOUNTER — Encounter: Payer: Self-pay | Admitting: Internal Medicine

## 2022-10-18 ENCOUNTER — Encounter (HOSPITAL_COMMUNITY): Payer: Medicare Other

## 2022-10-19 ENCOUNTER — Telehealth: Payer: Self-pay | Admitting: Internal Medicine

## 2022-10-19 MED ORDER — AZITHROMYCIN 250 MG PO TABS: 250.0000 mg | ORAL_TABLET | ORAL | 0 refills | Status: DC

## 2022-10-19 MED ORDER — PREDNISONE 10 MG PO TABS: ORAL_TABLET | ORAL | 0 refills | Status: DC

## 2022-10-19 NOTE — Telephone Encounter (Signed)
I called and spoke with the pt to verify her pharm  Rxs were sent  Nothing further needed

## 2022-10-19 NOTE — Telephone Encounter (Signed)
Spoke with the pt  She is c/o increased cough over the past 3 days  Started with sore throat but this has now resolved  She states she had been wheezing- used her albuterol hfa and this helped  She denies fevers, aches  States that she is taking Robitussin DM without relief  Please advise, thanks!  Allergies  Allergen Reactions   Tramadol Other (See Comments)    Not to be taken because of existing seizure disorder/SEIZURES    Chlorthalidone Other (See Comments)    Was stopped due to kidney function

## 2022-10-19 NOTE — Telephone Encounter (Signed)
[facility] calling saying Dr. Doree Fudge for

## 2022-10-20 ENCOUNTER — Other Ambulatory Visit: Payer: Self-pay

## 2022-10-20 ENCOUNTER — Encounter (HOSPITAL_COMMUNITY): Payer: Medicare Other

## 2022-10-20 MED ORDER — CARVEDILOL 12.5 MG PO TABS: 12.5000 mg | ORAL_TABLET | Freq: Two times a day (BID) | ORAL | 3 refills | Status: DC

## 2022-10-20 NOTE — Telephone Encounter (Signed)
disregard

## 2022-10-23 ENCOUNTER — Encounter (HOSPITAL_COMMUNITY)
Admission: RE | Admit: 2022-10-23 | Discharge: 2022-10-23 | Disposition: A | Discharge status: Home / Self Care | Payer: Medicare Other | Source: Ambulatory Visit | Attending: Cardiology | Admitting: Cardiology

## 2022-10-23 DIAGNOSIS — I2111 ST elevation (STEMI) myocardial infarction involving right coronary artery: Secondary | ICD-10-CM

## 2022-10-23 DIAGNOSIS — Z955 Presence of coronary angioplasty implant and graft: Secondary | ICD-10-CM

## 2022-10-25 ENCOUNTER — Ambulatory Visit
Admission: RE | Admit: 2022-10-25 | Discharge: 2022-10-25 | Disposition: A | Discharge status: Home / Self Care | Payer: Medicare Other | Source: Ambulatory Visit | Attending: Physician Assistant | Admitting: Physician Assistant

## 2022-10-25 ENCOUNTER — Encounter (HOSPITAL_COMMUNITY)
Admission: RE | Admit: 2022-10-25 | Discharge: 2022-10-25 | Disposition: A | Discharge status: Home / Self Care | Payer: Medicare Other | Source: Ambulatory Visit | Attending: Cardiology | Admitting: Cardiology

## 2022-10-25 DIAGNOSIS — I2111 ST elevation (STEMI) myocardial infarction involving right coronary artery: Secondary | ICD-10-CM

## 2022-10-25 DIAGNOSIS — Z955 Presence of coronary angioplasty implant and graft: Secondary | ICD-10-CM

## 2022-10-25 DIAGNOSIS — R222 Localized swelling, mass and lump, trunk: Secondary | ICD-10-CM

## 2022-10-26 NOTE — Progress Notes (Signed)
Cardiac Individual Treatment Plan  Patient Details  Name: [name] MRN: [id number] Date of Birth: 18-Jun-1950 Referring Provider:   Flowsheet Row INTENSIVE CARDIAC REHAB ORIENT from 09/05/2022 in [facility]  Referring Provider Donato Schultz, MD       Initial Encounter Date:  Flowsheet Row INTENSIVE CARDIAC REHAB ORIENT from 09/05/2022 in [facility]  Date 09/05/22       Visit Diagnosis: 08/10/22 STEMI  08/10/22 S/P DES RCA x2 shockwave lithotripsy  Patient's Home Medications on Admission:  Current Outpatient Medications:    acetaminophen (TYLENOL) 500 MG tablet, Take 2 tablets (1,000 mg total) by mouth every 6 (six) hours as needed., Disp: 30 tablet, Rfl: 0   albuterol (PROAIR HFA) 108 (90 Base) MCG/ACT inhaler, Inhale 2 puffs into the lungs every 4 (four) hours as needed for wheezing or shortness of breath., Disp: 18 g, Rfl: 2   albuterol (PROVENTIL) (2.5 MG/3ML) 0.083% nebulizer solution, Take 3 mLs (2.5 mg total) by nebulization every 4 (four) hours as needed for wheezing or shortness of breath., Disp: 75 mL, Rfl: 12   ALPRAZolam (XANAX) 0.5 MG tablet, Take 1 tablet (0.5 mg total) by mouth 2 (two) times daily as needed for anxiety., Disp: 30 tablet, Rfl: 0   aspirin 81 MG chewable tablet, Chew 1 tablet (81 mg total) by mouth daily., Disp: 90 tablet, Rfl: 2   atorvastatin (LIPITOR) 80 MG tablet, Take 1 tablet (80 mg total) by mouth daily., Disp: 90 tablet, Rfl: 1   azithromycin (ZITHROMAX) 250 MG tablet, Take 1 tablet (250 mg total) by mouth as directed., Disp: 6 tablet, Rfl: 0   Biotin 5000 MCG CAPS, Take 5,000 mcg by mouth daily., Disp: , Rfl:    carvedilol (COREG) 12.5 MG tablet, Take 1 tablet (12.5 mg total) by mouth 2 (two) times daily., Disp: 60 tablet, Rfl: 3   clopidogrel (PLAVIX) 75 MG tablet, Take 1 tablet (75 mg total) by mouth daily., Disp: 90 tablet,  Rfl: 2   cyclobenzaprine (FLEXERIL) 10 MG tablet, Take 1 tablet (10 mg total) by mouth 2 (two) times daily as needed for muscle spasms., Disp: 6 tablet, Rfl: 0   divalproex (DEPAKOTE ER) 500 MG 24 hr tablet, TAKE 1 TABLET BY MOUTH AT  BEDTIME, Disp: 90 tablet, Rfl: 3   empagliflozin ([name]) 10 MG TABS tablet, Take 1 tablet (10 mg total) by mouth daily., Disp: 90 tablet, Rfl: 3   ferrous sulfate 325 (65 FE) MG tablet, Take 325 mg by mouth daily with breakfast., Disp: , Rfl:    isosorbide mononitrate (IMDUR) 30 MG 24 hr tablet, Take 1 tablet (30 mg total) by mouth daily., Disp: 90 tablet, Rfl: 1   latanoprost (XALATAN) 0.005 % ophthalmic solution, Place 1 drop into both eyes at bedtime., Disp: , Rfl:    losartan (COZAAR) 25 MG tablet, Take 1 tablet (25 mg total) by mouth daily., Disp: 90 tablet, Rfl: 0   magnesium oxide (MAG-OX) 400 (240 Mg) MG tablet, Take 1 tablet (400 mg total) by mouth daily., Disp: 60 tablet, Rfl: 0   memantine (NAMENDA) 10 MG tablet, Take 1 tablet (10 mg total) by mouth 2 (two) times daily., Disp: 180 tablet, Rfl: 3   metFORMIN (GLUCOPHAGE) 1000 MG tablet, Take 1,000 mg by mouth 2 (two) times daily with a meal., Disp: , Rfl:    montelukast (SINGULAIR) 10 MG tablet, TAKE 1 TABLET BY MOUTH  AT  BEDTIME, Disp: 90 tablet, Rfl: 3   Multiple Vitamins-Minerals (ONE-A-DAY WOMENS PO), Take 1 tablet by mouth daily with breakfast., Disp: , Rfl:    nitroGLYCERIN (NITROSTAT) 0.4 MG SL tablet, Place 1 tablet (0.4 mg total) under the tongue every 5 (five) minutes as needed for chest pain. Don't exceed more than 3 doses in 15 mins, Disp: 30 tablet, Rfl: 0   pantoprazole (PROTONIX) 40 MG tablet, Take 1 tablet (40 mg total) by mouth 2 (two) times daily before a meal. (Patient taking differently: Take 40 mg by mouth daily.), Disp: 60 tablet, Rfl: 0   predniSONE (DELTASONE) 10 MG tablet, 4 x 2 days, 2 x 2 days, 1 x 2 days then stop, Disp: 14 tablet, Rfl: 0   Probiotic Product (EQ PROBIOTIC) CAPS,  Take 1 capsule by mouth daily., Disp: , Rfl:   Past Medical History: Past Medical History:  Diagnosis Date   Anxiety    Arthritis    Asthma    Chronic diastolic CHF (congestive heart failure) (HCC) 07/17/2016   Coronary artery disease    a. NSTEMI 12/2015 - LHC 01/18/16: s/p overlapping DESx2 to RCA, 10% ost-prox Cx,10% mLAD.   Dementia (HCC)    Depression    Diabetes mellitus    Dyslipidemia    GERD (gastroesophageal reflux disease)    History of seizure    Hyperlipidemia    Hypertension    Hypertensive heart disease    NSTEMI (non-ST elevated myocardial infarction) (HCC) 12/2015   Stage 3 chronic kidney disease (HCC)     Tobacco Use: Social History   Tobacco Use  Smoking Status Former   Packs/day: 1.00   Years: 30.00   Additional pack years: 0.00   Total pack years: 30.00   Types: Cigarettes   Quit date: 01/09/2004   Years since quitting: 18.8  Smokeless Tobacco Never    Labs: Review Flowsheet  More data exists      Latest Ref Rng & Units 08/14/2021 08/20/2021 03/31/2022 08/10/2022 08/12/2022  Labs for ITP Cardiac and Pulmonary Rehab  Cholestrol 0 - 200 mg/dL - - - - 161   LDL (calc) 0 - 99 mg/dL - - - - 47   HDL-C >09 mg/dL - - - - 50   Trlycerides <150 mg/dL - - - - 604   Hemoglobin A1c 4.8 - 5.6 % - - - 6.3  -  PH, Arterial 7.35 - 7.45 - - - 7.376  -  PCO2 arterial 32 - 48 mmHg - - - 35.7  -  Bicarbonate 20.0 - 28.0 mmol/L 26.6  25.4  - 21.0  -  TCO2 22 - 32 mmol/L - - 23  22  -  Acid-base deficit 0.0 - 2.0 mmol/L - - - 4.0  -  O2 Saturation % 96.3  99.5  - 100  -    Capillary Blood Glucose: Lab Results  Component Value Date   GLUCAP 98 09/13/2022   GLUCAP 102 (H) 09/13/2022   GLUCAP 121 (H) 09/11/2022   GLUCAP 100 (H) 09/11/2022   GLUCAP 163 (H) 08/14/2022     Exercise Target Goals: Exercise Program Goal: Individual exercise prescription set using results from initial 6 min walk test and THRR while considering  patient's activity barriers and  safety.   Exercise Prescription Goal: Initial exercise prescription builds to 30-45 minutes a day of aerobic activity, 2-3 days per week.  Home exercise guidelines will be given to patient during program as part of exercise prescription that the  participant will acknowledge.  Activity Barriers & Risk Stratification:  Activity Barriers & Cardiac Risk Stratification - 09/05/22 1425       Activity Barriers & Cardiac Risk Stratification   Activity Barriers Arthritis;Joint Problems;Deconditioning;Balance Concerns    Cardiac Risk Stratification High             6 Minute Walk:  6 Minute Walk     Row Name 09/05/22 1145         6 Minute Walk   Phase Initial     Distance 1227 feet     Walk Time 6 minutes     # of Rest Breaks 0     MPH 2.32     METS 2.6     RPE 9     Perceived Dyspnea  0     VO2 Peak 9.11     Comments 4/10 bilateral foot pain     Resting HR 72 bpm     Resting BP 144/80     Resting Oxygen Saturation  100 %     Exercise Oxygen Saturation  during 6 min walk 100 %     Max Ex. HR 93 bpm     Max Ex. BP 146/80     2 Minute Post BP 136/82              Oxygen Initial Assessment:   Oxygen Re-Evaluation:   Oxygen Discharge (Final Oxygen Re-Evaluation):   Initial Exercise Prescription:  Initial Exercise Prescription - 09/05/22 1400       Date of Initial Exercise RX and Referring Provider   Date 09/05/22    Referring Provider Donato Schultz, MD    Expected Discharge Date 11/17/22      Recumbant Bike   Level 1.5    RPM 60    Watts 25    Minutes 15    METs 2.6      NuStep   Level 2    SPM 75    Minutes 15    METs 2.6      Prescription Details   Frequency (times per week) 3    Duration Progress to 30 minutes of continuous aerobic without signs/symptoms of physical distress      Intensity   THRR 40-80% of Max Heartrate 59-118    Ratings of Perceived Exertion 11-13    Perceived Dyspnea 0-4      Progression   Progression Continue progressive  overload as per policy without signs/symptoms or physical distress.      Resistance Training   Training Prescription Yes    Weight 2 lbs    Reps 10-15             Perform Capillary Blood Glucose checks as needed.  Exercise Prescription Changes:   Exercise Prescription Changes     Row Name 09/11/22 1706 09/27/22 1600 10/11/22 1600 10/23/22 1650       Response to Exercise   Blood Pressure (Admit) 128/70 144/80 144/80 120/86    Blood Pressure (Exercise) 170/88 144/82 166/78 134/68    Blood Pressure (Exit) 132/68 122/80 150/90 128/80    Heart Rate (Admit) 76 bpm 100 bpm 100 bpm 70 bpm    Heart Rate (Exercise) 102 bpm 121 bpm 126 bpm 99 bpm    Heart Rate (Exit) 85 bpm 100 bpm 104 bpm 79 bpm    Rating of Perceived Exertion (Exercise) 12 13 12.5 11.5    Perceived Dyspnea (Exercise) 0 0 0 0    Symptoms high BP, but resolved  with rest none none none    Comments Pt first day in the [facility] Reviewed METs and Goals REVD MET's and home ExRx REVD MET's and goals    Duration Progress to 30 minutes of  aerobic without signs/symptoms of physical distress Progress to 30 minutes of  aerobic without signs/symptoms of physical distress Progress to 30 minutes of  aerobic without signs/symptoms of physical distress Progress to 30 minutes of  aerobic without signs/symptoms of physical distress    Intensity THRR unchanged THRR unchanged THRR unchanged THRR unchanged      Progression   Progression Continue to progress workloads to maintain intensity without signs/symptoms of physical distress. Continue to progress workloads to maintain intensity without signs/symptoms of physical distress. Continue to progress workloads to maintain intensity without signs/symptoms of physical distress. Continue to progress workloads to maintain intensity without signs/symptoms of physical distress.    Average METs 1.75 1.9 2.35 2.85      Resistance Training   Training Prescription Yes No  no wts on wed No Yes     Weight 2 lbs -- -- 2 lbs wts    Reps 10-15 -- -- 10-15    Time 10 Minutes -- -- 10 Minutes      Recumbant Bike   Level 1.5 2 1   DEC WL due to hematoma today 1    RPM -- -- 59 63    Watts -- 10 13 15     Minutes 15 15 15 15     METs 1.7 1.8 1.9 2.6      NuStep   Level 2 2 2 2     SPM 63 91 103 108    Minutes 15 15 15 15     METs 1.8 2 2.8 3.1      Home Exercise Plan   Plans to continue exercise at -- -- Home (comment) Home (comment)    Frequency -- -- Add 4 additional days to program exercise sessions. Add 4 additional days to program exercise sessions.    Initial Home Exercises Provided -- -- 10/11/22 10/11/22             Exercise Comments:   Exercise Comments     Row Name 09/11/22 1714 09/27/22 1700 10/11/22 1656 10/23/22 1656     Exercise Comments Pt first day in the [facility] program. Pt tolerated exercise well with an average MET level of 1.75. Pt is learning her THRR, RPE and ExRx Reviewed METs and goals with pt. Pt has increased her avg MET level to 1.9 and is making steady progress towards her goals. Pt is enjoying the program so far, will continue to progress workloads as tolerated without s/sx. REVD MET's and home ExRx. Pt tolerated exercise well with an average MET level of 2.35. Pt will continue to exercise on her own by walking and using her stepper machine for 30-45 mins 3-4 days. Pt is taking is easy right now due to hematoma and cortisone shot Reviewed METs and goals with pt today. Pt is tolerating exercise well with an avg MET level of 2.85. Pt is progressing well and is feeling good with her progress. She has increased her MET's and stamina over time and is gaining a lot of confidence. She has also wanted to get back into travel and well be able to travel with her sisters in a few weeks. She is excited and confident.             Exercise Goals and Review:   Exercise Goals  Row Name 09/05/22 1426             Exercise Goals   Increase Physical  Activity Yes       Intervention Provide advice, education, support and counseling about physical activity/exercise needs.;Develop an individualized exercise prescription for aerobic and resistive training based on initial evaluation findings, risk stratification, comorbidities and participant's personal goals.       Expected Outcomes Short Term: Attend rehab on a regular basis to increase amount of physical activity.;Long Term: Add in home exercise to make exercise part of routine and to increase amount of physical activity.;Long Term: Exercising regularly at least 3-5 days a week.       Increase Strength and Stamina Yes       Intervention Provide advice, education, support and counseling about physical activity/exercise needs.;Develop an individualized exercise prescription for aerobic and resistive training based on initial evaluation findings, risk stratification, comorbidities and participant's personal goals.       Expected Outcomes Short Term: Increase workloads from initial exercise prescription for resistance, speed, and METs.;Short Term: Perform resistance training exercises routinely during rehab and add in resistance training at home;Long Term: Improve cardiorespiratory fitness, muscular endurance and strength as measured by increased METs and functional capacity ( )       Able to understand and use rate of perceived exertion (RPE) scale Yes       Intervention Provide education and explanation on how to use RPE scale       Expected Outcomes Short Term: Able to use RPE daily in rehab to express subjective intensity level;Long Term:  Able to use RPE to guide intensity level when exercising independently       Knowledge and understanding of Target Heart Rate Range (THRR) Yes       Intervention Provide education and explanation of THRR including how the numbers were predicted and where they are located for reference       Expected Outcomes Short Term: Able to state/look up THRR;Short Term: Able  to use daily as guideline for intensity in rehab;Long Term: Able to use THRR to govern intensity when exercising independently       Understanding of Exercise Prescription Yes       Intervention Provide education, explanation, and written materials on patient's individual exercise prescription       Expected Outcomes Short Term: Able to explain program exercise prescription;Long Term: Able to explain home exercise prescription to exercise independently                Exercise Goals Re-Evaluation :  Exercise Goals Re-Evaluation     Row Name 09/11/22 1710 09/27/22 1656 10/23/22 1653         Exercise Goal Re-Evaluation   Exercise Goals Review Increase Physical Activity;Understanding of Exercise Prescription;Increase Strength and Stamina;Knowledge and understanding of Target Heart Rate Range (THRR);Able to understand and use rate of perceived exertion (RPE) scale Increase Physical Activity;Understanding of Exercise Prescription;Increase Strength and Stamina;Knowledge and understanding of Target Heart Rate Range (THRR);Able to understand and use rate of perceived exertion (RPE) scale Increase Physical Activity;Understanding of Exercise Prescription;Increase Strength and Stamina;Knowledge and understanding of Target Heart Rate Range (THRR);Able to understand and use rate of perceived exertion (RPE) scale     Comments Pt first day in the [facility] program. Pt tolerated exercise well with an average MET level of 1.75. Pt is learning her THRR, RPE and ExRx Reviewed METs and goals with pt today. Pt is tolerating exercise well with an avg MET level  of 1.9. Discussed with pt increasing RPMs on recumbant bike, however pt is limited by chronic knee pain. Pt said she is willing to try next session, but discussed switching equipment if knee pain persist or worsens. Pt feels good about her goals of increasing energy, travelling again, and building confidence. Pt states that exercising has helped her stress  levels and she very much enjoys coming to class because she is having fun and feeling stronger. Reviewed METs and goals with pt today. Pt is tolerating exercise well with an avg MET level of 2.85. Pt is progressing well and is feeling good with her progress. She has increased her MET's and stamina over time and is gaining a lot of confidence. She has also wanted to get back into travel and well be able to travel with her sisters in a few weeks. She is excited and confident.     Expected Outcomes Will continue to monitor pt and progress WL as tolerated without sign or symptom Will continue to progress workloads as tolerated without s/sx. Will continue to progress workloads as tolerated without s/sx.              Discharge Exercise Prescription (Final Exercise Prescription Changes):  Exercise Prescription Changes - 10/23/22 1650       Response to Exercise   Blood Pressure (Admit) 120/86    Blood Pressure (Exercise) 134/68    Blood Pressure (Exit) 128/80    Heart Rate (Admit) 70 bpm    Heart Rate (Exercise) 99 bpm    Heart Rate (Exit) 79 bpm    Rating of Perceived Exertion (Exercise) 11.5    Perceived Dyspnea (Exercise) 0    Symptoms none    Comments REVD MET's and goals    Duration Progress to 30 minutes of  aerobic without signs/symptoms of physical distress    Intensity THRR unchanged      Progression   Progression Continue to progress workloads to maintain intensity without signs/symptoms of physical distress.    Average METs 2.85      Resistance Training   Training Prescription Yes    Weight 2 lbs wts    Reps 10-15    Time 10 Minutes      Recumbant Bike   Level 1    RPM 63    Watts 15    Minutes 15    METs 2.6      NuStep   Level 2    SPM 108    Minutes 15    METs 3.1      Home Exercise Plan   Plans to continue exercise at Home (comment)    Frequency Add 4 additional days to program exercise sessions.    Initial Home Exercises Provided 10/11/22              Nutrition:  Target Goals: Understanding of nutrition guidelines, daily intake of sodium 1500mg , cholesterol 200mg , calories 30% from fat and 7% or less from saturated fats, daily to have 5 or more servings of fruits and vegetables.  Biometrics:  Pre Biometrics - 09/05/22 1146       Pre Biometrics   Waist Circumference 27.75 inches    Hip Circumference 38.5 inches    Waist to Hip Ratio 0.72 %    Triceps Skinfold 42 mm    % Body Fat 38.6 %    Grip Strength 79 kg    Flexibility 14.75 in    Single Leg Stand 6.87 seconds  Nutrition Therapy Plan and Nutrition Goals:  Nutrition Therapy & Goals - 09/11/22 1620       Nutrition Therapy   Diet Heart Healthy Diet    Drug/Food Interactions Statins/Certain Fruits      Personal Nutrition Goals   Nutrition Goal Patient to identify strategies for reducing cardiovascular risk by attending the [facility] education and nutrition series weekly.    Personal Goal #2 Patient to improve diet quality by using the plate method as a guide for meal planning to include lean protein/plant protein, fruits, vegetables, whole grains, nonfat dairy as part of a well-balanced diet.    Personal Goal #3 Patient to reduce sodium intake to 1500mg  per day    Comments Answered many questions regarding kidney function and nutrition; she does plan to follow-up with nephrology.  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 Patient will benefit from participation in intensive cardiac rehab for nutrition, exercise, and lifestyle modification.      Intervention Plan   Intervention Prescribe, educate and counsel regarding individualized specific dietary modifications aiming towards targeted core components such as weight, hypertension, lipid management, diabetes, heart failure and other comorbidities.;Nutrition handout(s) given to patient.    Expected Outcomes Short Term Goal: Understand basic principles of dietary content, such as calories,  fat, sodium, cholesterol and nutrients.;Long Term Goal: Adherence to prescribed nutrition plan.             Nutrition Assessments:  MEDIFICTS Score Key: ?70 Need to make dietary changes  40-70 Heart Healthy Diet ? 40 Therapeutic Level Cholesterol Diet    Picture Your Plate Scores: <14 Unhealthy dietary pattern with much room for improvement. 41-50 Dietary pattern unlikely to meet recommendations for good health and room for improvement. 51-60 More healthful dietary pattern, with some room for improvement.  >60 Healthy dietary pattern, although there may be some specific behaviors that could be improved.    Nutrition Goals Re-Evaluation:  Nutrition Goals Re-Evaluation     Row Name 09/11/22 1620             Goals   Current Weight 146 lb 9.7 oz (66.5 kg)       Comment triglycerides 188, A1c 6.3, LipoproteinA 169.8       Expected Outcome Answered many questions regarding kidney function and nutrition; she does plan to follow-up with nephrology. 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 Patient will benefit from participation in intensive cardiac rehab for nutrition, exercise, and lifestyle modification.                Nutrition Goals Re-Evaluation:  Nutrition Goals Re-Evaluation     Row Name 09/11/22 1620             Goals   Current Weight 146 lb 9.7 oz (66.5 kg)       Comment triglycerides 188, A1c 6.3, LipoproteinA 169.8       Expected Outcome Answered many questions regarding kidney function and nutrition; she does plan to follow-up with nephrology. 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 Patient will benefit from participation in intensive cardiac rehab for nutrition, exercise, and lifestyle modification.                Nutrition Goals Discharge (Final Nutrition Goals Re-Evaluation):  Nutrition Goals Re-Evaluation - 09/11/22 1620       Goals   Current Weight 146 lb 9.7 oz (66.5 kg)    Comment triglycerides 188, A1c  6.3, LipoproteinA 169.8  Expected Outcome Answered many questions regarding kidney function and nutrition; she does plan to follow-up with nephrology. 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 Patient will benefit from participation in intensive cardiac rehab for nutrition, exercise, and lifestyle modification.             Psychosocial: Target Goals: Acknowledge presence or absence of significant depression and/or stress, maximize coping skills, provide positive support system. Participant is able to verbalize types and ability to use techniques and skills needed for reducing stress and depression.  Initial Review & Psychosocial Screening:  Initial Psych Review & Screening - 09/05/22 1153       Initial Review   Current issues with History of Depression;Current Stress Concerns;Current Depression;Current Sleep Concerns    Source of Stress Concerns Chronic Illness;Family    Comments Willisha says she is experiencing some depression as her husband has cancer. Anchal has anxiety that she takes medication for. Mellonie says she is in the process of receiving counseling , which has not been set up yet.      Family Dynamics   Good Support System? Yes   Charity has her husband, 3 children, and daughter in law for support     Barriers   Psychosocial barriers to participate in program The patient should benefit from training in stress management and relaxation.;There are no identifiable barriers or psychosocial needs.      Screening Interventions   Interventions Encouraged to exercise;Provide feedback about the scores to participant    Expected Outcomes Long Term Goal: Stressors or current issues are controlled or eliminated.;Short Term goal: Identification and review with participant of any Quality of Life or Depression concerns found by scoring the questionnaire.;Long Term goal: The participant improves quality of Life and PHQ9 Scores as seen by post scores and/or verbalization of  changes             Quality of Life Scores:  Quality of Life - 09/05/22 1432       Quality of Life   Select Quality of Life      Quality of Life Scores   Health/Function Pre 19.29 %    Socioeconomic Pre 23 %    Psych/Spiritual Pre 23.14 %    Family Pre 28.8 %    GLOBAL Pre 22.31 %            Scores of 19 and below usually indicate a poorer quality of life in these areas.  A difference of  2-3 points is a clinically meaningful difference.  A difference of 2-3 points in the total score of the Quality of Life Index has been associated with significant improvement in overall quality of life, self-image, physical symptoms, and general health in studies assessing change in quality of life.  PHQ-9: Review Flowsheet       09/05/2022  Depression screen PHQ 2/9  Decreased Interest 1  Down, Depressed, Hopeless 2  PHQ - 2 Score 3  Altered sleeping 3  Tired, decreased energy 2  Change in appetite 2  Feeling bad or failure about yourself  1  Trouble concentrating 1  Moving slowly or fidgety/restless 1  Suicidal thoughts 0  PHQ-9 Score 13  Difficult doing work/chores Somewhat difficult   Interpretation of Total Score  Total Score Depression Severity:  1-4 = Minimal depression, 5-9 = Mild depression, 10-14 = Moderate depression, 15-19 = Moderately severe depression, 20-27 = Severe depression   Psychosocial Evaluation and Intervention:   Psychosocial Re-Evaluation:  Psychosocial Re-Evaluation  Row Name 09/11/22 1701 10/04/22 1041 10/26/22 1315         Psychosocial Re-Evaluation   Current issues with History of Depression;Current Depression;Current Stress Concerns;Current Sleep Concerns History of Depression;Current Depression;Current Stress Concerns;Current Sleep Concerns History of Depression;Current Stress Concerns;Current Sleep Concerns     Comments Shaye did not voice any increased concerns or stressors on her first day of exerciee. Will reivew QOL this week  Quality of life questionnaire review. Patient's PCP, Dr Tiburcio Pea office notified that Drayah is intersted in receiving counselling they gave her a list of providers. Betti says her chest soreness is getting better. 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 Yamille says she no longer is interested in receiving counselling. Malorey says she is in a better place and is feeling better regarding her mental health     Expected Outcomes Francisco will have decreased or controlled depression, stress cooncerns upon compleition of intensive cardiac rehab Bettymae will have decreased or controlled depression, stress cooncerns upon compleition of intensive cardiac rehab Kiera will have decreased or controlled depression, stress cooncerns upon compleition of intensive cardiac rehab     Interventions Encouraged to attend Cardiac Rehabilitation for the exercise;Stress management education;Relaxation education Encouraged to attend Cardiac Rehabilitation for the exercise;Stress management education;Relaxation education Encouraged to attend Cardiac Rehabilitation for the exercise;Stress management education;Relaxation education     Continue Psychosocial Services  Follow up required by staff Follow up required by staff Follow up required by staff       Initial Review   Source of Stress Concerns Chronic Illness;Family Chronic Illness;Family Chronic Illness;Family     Comments Will continue to monitor and offer support as needed Will continue to monitor and offer support as needed Will continue to monitor and offer support as needed              Psychosocial Discharge (Final Psychosocial Re-Evaluation):  Psychosocial Re-Evaluation - 10/26/22 1315       Psychosocial Re-Evaluation   Current issues with History of Depression;Current Stress Concerns;Current Sleep Concerns    Comments Amiee says she no longer is interested in receiving counselling. Tianna says she is in a better place and is feeling better  regarding her mental health    Expected Outcomes Laurence will have decreased or controlled depression, stress cooncerns upon compleition of intensive cardiac rehab    Interventions Encouraged to attend Cardiac Rehabilitation for the exercise;Stress management education;Relaxation education    Continue Psychosocial Services  Follow up required by staff      Initial Review   Source of Stress Concerns Chronic Illness;Family    Comments Will continue to monitor and offer support as needed             Vocational Rehabilitation: Provide vocational rehab assistance to qualifying candidates.   Vocational Rehab Evaluation & Intervention:  Vocational Rehab - 09/05/22 1203       Initial Vocational Rehab Evaluation & Intervention   Assessment shows need for Vocational Rehabilitation No   Elois is retired and does not need vocational rehab at this time            Education: Education Goals: Education classes will be provided on a weekly basis, covering required topics. Participant will state understanding/return demonstration of topics presented.    Education     Row Name 09/11/22 1600     Education   Cardiac Education Topics Pritikin   Glass blower/designer Nutrition  Nutrition Workshop Fueling a Forensic psychologist   Instruction Review Code 1- Verbalizes Understanding   Class Start Time 1400   Class Stop Time 1453   Class Time Calculation (min) 53 min    Row Name 09/13/22 1500     Education   Cardiac Education Topics Pritikin   Customer service manager   Weekly Topic Simple Sides and Sauces   Instruction Review Code 1- Verbalizes Understanding   Class Start Time 1350   Class Stop Time 1425   Class Time Calculation (min) 35 min    Row Name 09/15/22 1500     Education   Cardiac Education Topics Pritikin   Nurse, children's Exercise Physiologist   Select  Psychosocial   Psychosocial How Our Thoughts Can Heal Our Hearts   Instruction Review Code 1- Verbalizes Understanding   Class Start Time 1400   Class Stop Time 1445   Class Time Calculation (min) 45 min    Row Name 09/18/22 1600     Education   Cardiac Education Topics Pritikin   Technical brewer Psychosocial   Psychosocial Workshop From Head to Heart: The Power of a Healthy Outlook   Instruction Review Code 1- Verbalizes Understanding   Class Start Time 1405   Class Stop Time 1502   Class Time Calculation (min) 57 min    Row Name 09/27/22 1500     Education   Cardiac Education Topics Pritikin   Orthoptist   Educator Dietitian   Weekly Topic Adding Flavor - Sodium-Free   Instruction Review Code 1- Verbalizes Understanding   Class Start Time 1400   Class Stop Time 1445   Class Time Calculation (min) 45 min    Row Name 09/29/22 1600     Education   Cardiac Education Topics Pritikin  Simultaneous filing. User may not have seen previous data.   Select Core Videos  Simultaneous filing. User may not have seen previous data.     Core Videos   Educator Nurse  Simultaneous filing. User may not have seen previous data.   Select General Education  Simultaneous filing. User may not have seen previous data.   General Education Heart Disease Risk Reduction   Instruction Review Code 1- Verbalizes Understanding  Simultaneous filing. User may not have seen previous data.   Class Start Time 1405  Simultaneous filing. User may not have seen previous data.   Class Stop Time 1450  Simultaneous filing. User may not have seen previous data.   Class Time Calculation (min) 45 min  Simultaneous filing. User may not have seen previous data.    Row Name 10/02/22 1600     Education   Cardiac Education Topics Pritikin   Select Workshops     Workshops    Educator Exercise Physiologist   Select Exercise   Exercise Workshop Location manager and Fall Prevention   Instruction Review Code 1- Verbalizes Understanding   Class Start Time 1405   Class Stop Time 1445   Class Time Calculation (min) 40 min    Row Name 10/04/22 1500     Education   Cardiac Education Topics Pritikin   Customer service manager  Weekly Topic Fast and Healthy Breakfasts   Instruction Review Code 1- Verbalizes Understanding   Class Start Time 1400   Class Stop Time 1440   Class Time Calculation (min) 40 min    Row Name 10/06/22 1500     Education   Cardiac Education Topics Pritikin   Licensed conveyancer Nutrition   Nutrition Overview of the Pritikin Eating Plan   Instruction Review Code 1- Verbalizes Understanding   Class Start Time 1400   Class Stop Time 1450   Class Time Calculation (min) 50 min    Row Name 10/09/22 1600     Education   Cardiac Education Topics Pritikin   Select Workshops     Workshops   Educator Exercise Physiologist   Select Psychosocial   Psychosocial Workshop Recognizing and Reducing Stress   Instruction Review Code 1- Verbalizes Understanding   Class Start Time 1359   Class Stop Time 1445   Class Time Calculation (min) 46 min    Row Name 10/11/22 1500     Education   Cardiac Education Topics Pritikin   Customer service manager   Weekly Topic Personalizing Your Pritikin Plate   Instruction Review Code 1- Verbalizes Understanding   Class Start Time 1355   Class Stop Time 1437   Class Time Calculation (min) 42 min    Row Name 10/13/22 1500     Education   Cardiac Education Topics Pritikin   Nurse, children's Exercise Physiologist   Select Psychosocial   Psychosocial Healthy Minds, Bodies, Hearts   Instruction Review Code 1- Verbalizes Understanding   Class  Start Time 1357   Class Stop Time 1429   Class Time Calculation (min) 32 min    Row Name 10/16/22 1500     Education   Cardiac Education Topics Pritikin   Glass blower/designer Nutrition   Nutrition Workshop Label Reading   Instruction Review Code 1- Verbalizes Understanding   Class Start Time 1400   Class Stop Time 1445   Class Time Calculation (min) 45 min    Row Name 10/23/22 1500     Education   Cardiac Education Topics Pritikin   Immunologist Exercise Physiologist   Select Exercise   Exercise Workshop Exercise Basics: Building Your Action Plan   Instruction Review Code 1- Verbalizes Understanding   Class Start Time 1404   Class Stop Time 1448   Class Time Calculation (min) 44 min    Row Name 10/25/22 1500     Education   Cardiac Education Topics Pritikin   Customer service manager   Weekly Topic Tasty Appetizers and Snacks   Instruction Review Code 1- Verbalizes Understanding   Class Start Time 1400   Class Stop Time 1440   Class Time Calculation (min) 40 min    Row Name 10/27/22 1414     Education   Cardiac Education Topics Pritikin   Nurse, children's Exercise Physiologist   Select Nutrition   Nutrition Nutrition Action Plan   Instruction Review Code 1- Verbalizes Understanding   Class Start Time 1400   Class Stop Time 1440  Class Time Calculation (min) 40 min     Cooking School   Instruction Review Code 1- [place] Understanding    Row Name 10/30/22 1500     Education   Cardiac Education Topics Pritikin   Licensed conveyancer Nutrition   Nutrition Calorie Density   Instruction Review Code 1- Verbalizes Understanding   Class Start Time 1400   Class Stop Time 1445   Class Time Calculation (min) 45 min            Core Videos: Exercise    Move It!   Clinical staff conducted group or individual video education with verbal and written material and guidebook.  Patient learns the recommended Pritikin exercise program. Exercise with the goal of living a long, healthy life. Some of the health benefits of exercise include controlled diabetes, healthier blood pressure levels, improved cholesterol levels, improved heart and lung capacity, improved sleep, and better body composition. Everyone should speak with their doctor before starting or changing an exercise routine.  Biomechanical Limitations Clinical staff conducted group or individual video education with verbal and written material and guidebook.  Patient learns how biomechanical limitations can impact exercise and how we can mitigate and possibly overcome limitations to have an impactful and balanced exercise routine.  Body Composition Clinical staff conducted group or individual video education with verbal and written material and guidebook.  Patient learns that body composition (ratio of muscle mass to fat mass) is a key component to assessing overall fitness, rather than body weight alone. Increased fat mass, especially visceral belly fat, can put [place] at increased risk for metabolic syndrome, type 2 diabetes, heart disease, and even death. It is recommended to combine diet and exercise (cardiovascular and resistance training) to improve your body composition. Seek guidance from your physician and exercise physiologist before implementing an exercise routine.  Exercise Action Plan Clinical staff conducted group or individual video education with verbal and written material and guidebook.  Patient learns the recommended strategies to achieve and enjoy long-term exercise adherence, including variety, self-motivation, self-efficacy, and positive decision making. Benefits of exercise include fitness, good health, weight management, more energy, better sleep, less stress, and overall  well-being.  Medical   Heart Disease Risk Reduction Clinical staff conducted group or individual video education with verbal and written material and guidebook.  Patient learns our heart is our most vital organ as it circulates oxygen, nutrients, white blood cells, and hormones throughout the entire body, and carries waste away. Data supports a plant-based eating plan like the Pritikin Program for its effectiveness in slowing progression of and reversing heart disease. The video provides a number of recommendations to address heart disease.   Metabolic Syndrome and Belly Fat  Clinical staff conducted group or individual video education with verbal and written material and guidebook.  Patient learns what metabolic syndrome is, how it leads to heart disease, and how one can reverse it and keep it from coming back. You have metabolic syndrome if you have 3 of the following 5 criteria: abdominal obesity, high blood pressure, high triglycerides, low HDL cholesterol, and high blood sugar.  Hypertension and Heart Disease Clinical staff conducted group or individual video education with verbal and written material and guidebook.  Patient learns that high blood pressure, or hypertension, is very common in the [place]. Hypertension is largely due to excessive salt intake, but other important risk factors include being overweight, physical inactivity, drinking too  much alcohol, smoking, and not eating enough potassium from fruits and vegetables. High blood pressure is a leading risk factor for heart attack, stroke, congestive heart failure, dementia, kidney failure, and premature death. Long-term effects of excessive salt intake include stiffening of the arteries and thickening of heart muscle and organ damage. Recommendations include ways to reduce hypertension and the risk of heart disease.  Diseases of Our Time - Focusing on Diabetes Clinical staff conducted group or individual video education with  verbal and written material and guidebook.  Patient learns why the best way to stop diseases of our time is prevention, through food and other lifestyle changes. Medicine (such as prescription pills and surgeries) is often only a Band-Aid on the problem, not a long-term solution. Most common diseases of our time include obesity, type 2 diabetes, hypertension, heart disease, and cancer. The Pritikin Program is recommended and has been proven to help reduce, reverse, and/or prevent the damaging effects of metabolic syndrome.  Nutrition   Overview of the Pritikin Eating Plan  Clinical staff conducted group or individual video education with verbal and written material and guidebook.  Patient learns about the Pritikin Eating Plan for disease risk reduction. The Pritikin Eating Plan emphasizes a wide variety of unrefined, minimally-processed carbohydrates, like fruits, vegetables, whole grains, and legumes. Go, Caution, and Stop food choices are explained. Plant-based and lean animal proteins are emphasized. Rationale provided for low sodium intake for blood pressure control, low added sugars for blood sugar stabilization, and low added fats and oils for coronary artery disease risk reduction and weight management.  Calorie Density  Clinical staff conducted group or individual video education with verbal and written material and guidebook.  Patient learns about calorie density and how it impacts the Pritikin Eating Plan. Knowing the characteristics of the food you choose will help you decide whether those foods will lead to weight gain or weight loss, and whether you want to consume more or less of them. Weight loss is usually a side effect of the Pritikin Eating Plan because of its focus on low calorie-dense foods.  Label Reading  Clinical staff conducted group or individual video education with verbal and written material and guidebook.  Patient learns about the Pritikin recommended label reading  guidelines and corresponding recommendations regarding calorie density, added sugars, sodium content, and whole grains.  Dining Out - Part 1  Clinical staff conducted group or individual video education with verbal and written material and guidebook.  Patient learns that restaurant meals can be sabotaging because they can be so high in calories, fat, sodium, and/or sugar. Patient learns recommended strategies on how to positively address this and avoid unhealthy pitfalls.  Facts on Fats  Clinical staff conducted group or individual video education with verbal and written material and guidebook.  Patient learns that lifestyle modifications can be just as effective, if not more so, as many medications for lowering your risk of heart disease. A Pritikin lifestyle can help to reduce your risk of inflammation and atherosclerosis (cholesterol build-up, or plaque, in the artery walls). Lifestyle interventions such as dietary choices and physical activity address the cause of atherosclerosis. A review of the types of fats and their impact on blood cholesterol levels, along with dietary recommendations to reduce fat intake is also included.  Nutrition Action Plan  Clinical staff conducted group or individual video education with verbal and written material and guidebook.  Patient learns how to incorporate Pritikin recommendations into their lifestyle. Recommendations include planning and keeping personal health  goals in mind as an important part of their success.  Healthy Mind-Set    Healthy Minds, Bodies, Hearts  Clinical staff conducted group or individual video education with verbal and written material and guidebook.  Patient learns how to identify when they are stressed. Video will discuss the impact of that stress, as well as the many benefits of stress management. Patient will also be introduced to stress management techniques. The way we think, act, and feel has an impact on our hearts.  How Our  Thoughts Can Heal Our Hearts  Clinical staff conducted group or individual video education with verbal and written material and guidebook.  Patient learns that negative thoughts can cause depression and anxiety. This can result in negative lifestyle behavior and serious health problems. Cognitive behavioral therapy is an effective method to help control our thoughts in order to change and improve our emotional outlook.  Additional Videos:  Exercise    Improving Performance  Clinical staff conducted group or individual video education with verbal and written material and guidebook.  Patient learns to use a non-linear approach by alternating intensity levels and lengths of time spent exercising to help burn more calories and lose more body fat. Cardiovascular exercise helps improve heart health, metabolism, hormonal balance, blood sugar control, and recovery from fatigue. Resistance training improves strength, endurance, balance, coordination, reaction time, metabolism, and muscle mass. Flexibility exercise improves circulation, posture, and balance. Seek guidance from your physician and exercise physiologist before implementing an exercise routine and learn your capabilities and proper form for all exercise.  Introduction to Yoga  Clinical staff conducted group or individual video education with verbal and written material and guidebook.  Patient learns about yoga, a discipline of the coming together of mind, breath, and body. The benefits of yoga include improved flexibility, improved range of motion, better posture and core strength, increased lung function, weight loss, and positive self-image. Yoga's heart health benefits include lowered blood pressure, healthier heart rate, decreased cholesterol and triglyceride levels, improved immune function, and reduced stress. Seek guidance from your physician and exercise physiologist before implementing an exercise routine and learn your capabilities and  proper form for all exercise.  Medical   Aging: Enhancing Your Quality of Life  Clinical staff conducted group or individual video education with verbal and written material and guidebook.  Patient learns key strategies and recommendations to stay in good physical health and enhance quality of life, such as prevention strategies, having an advocate, securing a Health Care Proxy and Power of Attorney, and keeping a list of medications and system for tracking them. It also discusses how to avoid risk for bone loss.  Biology of Weight Control  Clinical staff conducted group or individual video education with verbal and written material and guidebook.  Patient learns that weight gain occurs because we consume more calories than we burn (eating more, moving less). Even if your body weight is normal, you may have higher ratios of fat compared to muscle mass. Too much body fat puts you at increased risk for cardiovascular disease, heart attack, stroke, type 2 diabetes, and obesity-related cancers. In addition to exercise, following the Pritikin Eating Plan can help reduce your risk.  Decoding Lab Results  Clinical staff conducted group or individual video education with verbal and written material and guidebook.  Patient learns that lab test reflects one measurement whose values change over time and are influenced by many factors, including medication, stress, sleep, exercise, food, hydration, pre-existing medical conditions, and more. It is  recommended to use the knowledge from this video to become more involved with your lab results and evaluate your numbers to speak with your doctor.   Diseases of Our Time - Overview  Clinical staff conducted group or individual video education with verbal and written material and guidebook.  Patient learns that according to the CDC, 50% to 70% of chronic diseases (such as obesity, type 2 diabetes, elevated lipids, hypertension, and heart disease) are avoidable through  lifestyle improvements including healthier food choices, listening to satiety cues, and increased physical activity.  Sleep Disorders Clinical staff conducted group or individual video education with verbal and written material and guidebook.  Patient learns how good quality and duration of sleep are important to overall health and well-being. Patient also learns about sleep disorders and how they impact health along with recommendations to address them, including discussing with a physician.  Nutrition  Dining Out - Part 2 Clinical staff conducted group or individual video education with verbal and written material and guidebook.  Patient learns how to plan ahead and communicate in order to maximize their dining experience in a healthy and nutritious manner. Included are recommended food choices based on the type of restaurant the patient is visiting.   Fueling a Banker conducted group or individual video education with verbal and written material and guidebook.  There is a strong connection between our food choices and our health. Diseases like obesity and type 2 diabetes are very prevalent and are in large-part due to lifestyle choices. The Pritikin Eating Plan provides plenty of food and hunger-curbing satisfaction. It is easy to follow, affordable, and helps reduce health risks.  Menu Workshop  Clinical staff conducted group or individual video education with verbal and written material and guidebook.  Patient learns that restaurant meals can sabotage health goals because they are often packed with calories, fat, sodium, and sugar. Recommendations include strategies to plan ahead and to communicate with the manager, chef, or server to help order a healthier meal.  Planning Your Eating Strategy  Clinical staff conducted group or individual video education with verbal and written material and guidebook.  Patient learns about the Pritikin Eating Plan and its benefit of  reducing the risk of disease. The Pritikin Eating Plan does not focus on calories. Instead, it emphasizes high-quality, nutrient-rich foods. By knowing the characteristics of the foods, we choose, we can determine their calorie density and make informed decisions.  Targeting Your Nutrition Priorities  Clinical staff conducted group or individual video education with verbal and written material and guidebook.  Patient learns that lifestyle habits have a tremendous impact on disease risk and progression. This video provides eating and physical activity recommendations based on your personal health goals, such as reducing LDL cholesterol, losing weight, preventing or controlling type 2 diabetes, and reducing high blood pressure.  Vitamins and Minerals  Clinical staff conducted group or individual video education with verbal and written material and guidebook.  Patient learns different ways to obtain key vitamins and minerals, including through a recommended healthy diet. It is important to discuss all supplements you take with your doctor.   Healthy Mind-Set    Smoking Cessation  Clinical staff conducted group or individual video education with verbal and written material and guidebook.  Patient learns that cigarette smoking and tobacco addiction pose a serious health risk which affects millions of people. Stopping smoking will significantly reduce the risk of heart disease, lung disease, and many forms of cancer. Recommended strategies for  quitting are covered, including working with your doctor to develop a successful plan.  Culinary   Becoming a Set designer conducted group or individual video education with verbal and written material and guidebook.  Patient learns that cooking at home can be healthy, cost-effective, quick, and puts them in control. Keys to cooking healthy recipes will include looking at your recipe, assessing your equipment needs, planning ahead, making it  simple, choosing cost-effective seasonal ingredients, and limiting the use of added fats, salts, and sugars.  Cooking - Breakfast and Snacks  Clinical staff conducted group or individual video education with verbal and written material and guidebook.  Patient learns how important breakfast is to satiety and nutrition through the entire day. Recommendations include key foods to eat during breakfast to help stabilize blood sugar levels and to prevent overeating at meals later in the day. Planning ahead is also a key component.  Cooking - Educational psychologist conducted group or individual video education with verbal and written material and guidebook.  Patient learns eating strategies to improve overall health, including an approach to cook more at home. Recommendations include thinking of animal protein as a side on your plate rather than center stage and focusing instead on lower calorie dense options like vegetables, fruits, whole grains, and plant-based proteins, such as beans. Making sauces in large quantities to freeze for later and leaving the skin on your vegetables are also recommended to maximize your experience.  Cooking - Healthy Salads and Dressing Clinical staff conducted group or individual video education with verbal and written material and guidebook.  Patient learns that vegetables, fruits, whole grains, and legumes are the foundations of the Pritikin Eating Plan. Recommendations include how to incorporate each of these in flavorful and healthy salads, and how to create homemade salad dressings. Proper handling of ingredients is also covered. Cooking - Soups and [employer] - Soups and Desserts Clinical staff conducted group or individual video education with verbal and written material and guidebook.  Patient learns that Pritikin soups and desserts make for easy, nutritious, and delicious snacks and meal components that are low in sodium, fat, sugar, and calorie  density, while high in vitamins, minerals, and filling fiber. Recommendations include simple and healthy ideas for soups and desserts.   Overview     The Pritikin Solution Program Overview Clinical staff conducted group or individual video education with verbal and written material and guidebook.  Patient learns that the results of the Pritikin Program have been documented in more than 100 articles published in peer-reviewed journals, and the benefits include reducing risk factors for (and, in some cases, even reversing) high cholesterol, high blood pressure, type 2 diabetes, obesity, and more! An overview of the three key pillars of the Pritikin Program will be covered: eating well, doing regular exercise, and having a healthy mind-set.  WORKSHOPS  Exercise: Exercise Basics: Building Your Action Plan Clinical staff led group instruction and group discussion with PowerPoint presentation and patient guidebook. To enhance the learning environment the use of posters, models and videos may be added. At the conclusion of this workshop, patients will comprehend the difference between physical activity and exercise, as well as the benefits of incorporating both, into their routine. Patients will understand the FITT (Frequency, Intensity, Time, and Type) principle and how to use it to build an exercise action plan. In addition, safety concerns and other considerations for exercise and cardiac rehab will be addressed by the presenter. The purpose  of this lesson is to promote a comprehensive and effective weekly exercise routine in order to improve patients' overall level of fitness.   Managing Heart Disease: Your Path to a Healthier Heart Clinical staff led group instruction and group discussion with PowerPoint presentation and patient guidebook. To enhance the learning environment the use of posters, models and videos may be added.At the conclusion of this workshop, patients will understand the  anatomy and physiology of the heart. Additionally, they will understand how Pritikin's three pillars impact the risk factors, the progression, and the management of heart disease.  The purpose of this lesson is to provide a high-level overview of the heart, heart disease, and how the Pritikin lifestyle positively impacts risk factors.  Exercise Biomechanics Clinical staff led group instruction and group discussion with PowerPoint presentation and patient guidebook. To enhance the learning environment the use of posters, models and videos may be added. Patients will learn how the structural parts of their bodies function and how these functions impact their daily activities, movement, and exercise. Patients will learn how to promote a neutral spine, learn how to manage pain, and identify ways to improve their physical movement in order to promote healthy living. The purpose of this lesson is to expose patients to common physical limitations that impact physical activity. Participants will learn practical ways to adapt and manage aches and pains, and to minimize their effect on regular exercise. Patients will learn how to maintain good posture while sitting, walking, and lifting.  Balance Training and Fall Prevention  Clinical staff led group instruction and group discussion with PowerPoint presentation and patient guidebook. To enhance the learning environment the use of posters, models and videos may be added. At the conclusion of this workshop, patients will understand the importance of their sensorimotor skills (vision, proprioception, and the vestibular system) in maintaining their ability to balance as they age. Patients will apply a variety of balancing exercises that are appropriate for their current level of function. Patients will understand the common causes for poor balance, possible solutions to these problems, and ways to modify their physical environment in order to minimize their  fall risk. The purpose of this lesson is to teach patients about the importance of maintaining balance as they age and ways to minimize their risk of falling.  WORKSHOPS   Nutrition:  Fueling a Ship broker led group instruction and group discussion with PowerPoint presentation and patient guidebook. To enhance the learning environment the use of posters, models and videos may be added. Patients will review the foundational principles of the Pritikin Eating Plan and understand what constitutes a serving size in each of the food groups. Patients will also learn Pritikin-friendly foods that are better choices when away from home and review make-ahead meal and snack options. Calorie density will be reviewed and applied to three nutrition priorities: weight maintenance, weight loss, and weight gain. The purpose of this lesson is to reinforce (in a group setting) the key concepts around what patients are recommended to eat and how to apply these guidelines when away from home by planning and selecting Pritikin-friendly options. Patients will understand how calorie density may be adjusted for different weight management goals.  Mindful Eating  Clinical staff led group instruction and group discussion with PowerPoint presentation and patient guidebook. To enhance the learning environment the use of posters, models and videos may be added. Patients will briefly review the concepts of the Pritikin Eating Plan and the importance of low-calorie dense foods. The  concept of mindful eating will be introduced as well as the importance of paying attention to internal hunger signals. Triggers for non-hunger eating and techniques for dealing with triggers will be explored. The purpose of this lesson is to provide patients with the opportunity to review the basic principles of the Pritikin Eating Plan, discuss the value of eating mindfully and how to measure internal cues of hunger and fullness using the  Hunger Scale. Patients will also discuss reasons for non-hunger eating and learn strategies to use for controlling emotional eating.  Targeting Your Nutrition Priorities Clinical staff led group instruction and group discussion with PowerPoint presentation and patient guidebook. To enhance the learning environment the use of posters, models and videos may be added. Patients will learn how to determine their genetic susceptibility to disease by reviewing their family history. Patients will gain insight into the importance of diet as part of an overall healthy lifestyle in mitigating the impact of genetics and other environmental insults. The purpose of this lesson is to provide patients with the opportunity to assess their personal nutrition priorities by looking at their family history, their own health history and current risk factors. Patients will also be able to discuss ways of prioritizing and modifying the Pritikin Eating Plan for their highest risk areas  Menu  Clinical staff led group instruction and group discussion with PowerPoint presentation and patient guidebook. To enhance the learning environment the use of posters, models and videos may be added. Using menus brought in from [employer], or printed from [employer], patients will apply the Pritikin dining out guidelines that were presented in the [employer] video. Patients will also be able to practice these guidelines in a variety of provided scenarios. The purpose of this lesson is to provide patients with the opportunity to practice hands-on learning of the Pritikin Dining Out guidelines with actual menus and practice scenarios.  Label Reading Clinical staff led group instruction and group discussion with PowerPoint presentation and patient guidebook. To enhance the learning environment the use of posters, models and videos may be added. Patients will review and discuss the Pritikin label reading guidelines  presented in Pritikin's Label Reading Educational series video. Using fool labels brought in from local grocery stores and markets, patients will apply the label reading guidelines and determine if the packaged food meet the Pritikin guidelines. The purpose of this lesson is to provide patients with the opportunity to review, discuss, and practice hands-on learning of the Pritikin Label Reading guidelines with actual packaged food labels. Cooking School  Pritikin's [employer] are designed to teach patients ways to prepare quick, simple, and affordable recipes at home. The importance of nutrition's role in chronic disease risk reduction is reflected in its emphasis in the overall Pritikin program. By learning how to prepare essential core Pritikin Eating Plan recipes, patients will increase control over what they eat; be able to customize the flavor of foods without the use of added salt, sugar, or fat; and improve the quality of the food they consume. By learning a set of core recipes which are easily assembled, quickly prepared, and affordable, patients are more likely to prepare more healthy foods at home. These workshops focus on convenient breakfasts, simple entres, side dishes, and desserts which can be prepared with minimal effort and are consistent with nutrition recommendations for cardiovascular risk reduction. Cooking [employer] are taught by a Armed forces logistics/support/administrative officer (RD) who has been trained by the [employer]. The  chef or RD has a clear understanding of the importance of minimizing - if not completely eliminating - added fat, sugar, and sodium in recipes. Throughout the series of Cooking School Workshop sessions, patients will learn about healthy ingredients and efficient methods of cooking to build confidence in their capability to prepare    Cooking School weekly topics:  Adding Flavor- Sodium-Free  Fast and Healthy Breakfasts  Powerhouse Plant-Based  Proteins  Satisfying Salads and Dressings  Simple Sides and Sauces  International Cuisine-Spotlight on the [employer] Zones  Delicious Desserts  Savory Soups  [employer] - Meals in a Astronomer Appetizers and Snacks  Comforting Weekend Breakfasts  One-Pot Wonders   Fast Evening Meals  Landscape architect Your Pritikin Plate  WORKSHOPS   Healthy Mindset (Psychosocial):  Focused Goals, Sustainable Changes Clinical staff led group instruction and group discussion with PowerPoint presentation and patient guidebook. To enhance the learning environment the use of posters, models and videos may be added. Patients will be able to apply effective goal setting strategies to establish at least one personal goal, and then take consistent, meaningful action toward that goal. They will learn to identify common barriers to achieving personal goals and develop strategies to overcome them. Patients will also gain an understanding of how our mind-set can impact our ability to achieve goals and the importance of cultivating a positive and growth-oriented mind-set. The purpose of this lesson is to provide patients with a deeper understanding of how to set and achieve personal goals, as well as the tools and strategies needed to overcome common obstacles which may arise along the way.  From Head to Heart: The Power of a Healthy Outlook  Clinical staff led group instruction and group discussion with PowerPoint presentation and patient guidebook. To enhance the learning environment the use of posters, models and videos may be added. Patients will be able to recognize and describe the impact of emotions and mood on physical health. They will discover the importance of self-care and explore self-care practices which may work for them. Patients will also learn how to utilize the 4 C's to cultivate a healthier outlook and better manage stress and challenges. The purpose of this lesson is to demonstrate  to patients how a healthy outlook is an essential part of maintaining good health, especially as they continue their cardiac rehab journey.  Healthy Sleep for a Healthy Heart Clinical staff led group instruction and group discussion with PowerPoint presentation and patient guidebook. To enhance the learning environment the use of posters, models and videos may be added. At the conclusion of this workshop, patients will be able to demonstrate knowledge of the importance of sleep to overall health, well-being, and quality of life. They will understand the symptoms of, and treatments for, common sleep disorders. Patients will also be able to identify daytime and nighttime behaviors which impact sleep, and they will be able to apply these tools to help manage sleep-related challenges. The purpose of this lesson is to provide patients with a general overview of sleep and outline the importance of quality sleep. Patients will learn about a few of the most common sleep disorders. Patients will also be introduced to the concept of "sleep hygiene," and discover ways to self-manage certain sleeping problems through simple daily behavior changes. Finally, the workshop will motivate patients by clarifying the links between quality sleep and their goals of heart-healthy living.   Recognizing and Reducing Stress Clinical staff led group instruction and group discussion with PowerPoint  presentation and patient guidebook. To enhance the learning environment the use of posters, models and videos may be added. At the conclusion of this workshop, patients will be able to understand the types of stress reactions, differentiate between acute and chronic stress, and recognize the impact that chronic stress has on their health. They will also be able to apply different coping mechanisms, such as reframing negative self-talk. Patients will have the opportunity to practice a variety of stress management techniques, such as deep  abdominal breathing, progressive muscle relaxation, and/or guided imagery.  The purpose of this lesson is to educate patients on the role of stress in their lives and to provide healthy techniques for coping with it.  Learning Barriers/Preferences:  Learning Barriers/Preferences - 09/05/22 1432       Learning Barriers/Preferences   Learning Barriers Sight    Learning Preferences Written Material;Computer/Internet             Education Topics:  Knowledge Questionnaire Score:  Knowledge Questionnaire Score - 09/05/22 1433       Knowledge Questionnaire Score   Pre Score 19/24             Core Components/Risk Factors/Patient Goals at Admission:  Personal Goals and Risk Factors at Admission - 09/05/22 1433       Core Components/Risk Factors/Patient Goals on Admission   Diabetes Yes    Intervention Provide education about signs/symptoms and action to take for hypo/hyperglycemia.;Provide education about proper nutrition, including hydration, and aerobic/resistive exercise prescription along with prescribed medications to achieve blood glucose in normal ranges: Fasting glucose 65-99 mg/dL    Expected Outcomes Short Term: Participant verbalizes understanding of the signs/symptoms and immediate care of hyper/hypoglycemia, proper foot care and importance of medication, aerobic/resistive exercise and nutrition plan for blood glucose control.;Long Term: Attainment of HbA1C < 7%.    Hypertension Yes    Intervention Provide education on lifestyle modifcations including regular physical activity/exercise, weight management, moderate sodium restriction and increased consumption of fresh fruit, vegetables, and low fat dairy, alcohol moderation, and smoking cessation.;Monitor prescription use compliance.    Expected Outcomes Short Term: Continued assessment and intervention until BP is < 140/25mm HG in hypertensive participants. < 130/66mm HG in hypertensive participants with diabetes, heart  failure or chronic kidney disease.;Long Term: Maintenance of blood pressure at goal levels.    Lipids Yes    Intervention Provide education and support for participant on nutrition & aerobic/resistive exercise along with prescribed medications to achieve LDL 70mg , HDL >40mg .    Expected Outcomes Short Term: Participant states understanding of desired cholesterol values and is compliant with medications prescribed. Participant is following exercise prescription and nutrition guidelines.;Long Term: Cholesterol controlled with medications as prescribed, with individualized exercise RX and with personalized nutrition plan. Value goals: LDL < 70mg , HDL > 40 mg.    Stress Yes    Intervention Offer individual and/or small group education and counseling on adjustment to heart disease, stress management and health-related lifestyle change. Teach and support self-help strategies.;Refer participants experiencing significant psychosocial distress to appropriate mental health specialists for further evaluation and treatment. When possible, include family members and significant others in education/counseling sessions.    Expected Outcomes Short Term: Participant demonstrates changes in health-related behavior, relaxation and other stress management skills, ability to obtain effective social support, and compliance with psychotropic medications if prescribed.;Long Term: Emotional wellbeing is indicated by absence of clinically significant psychosocial distress or social isolation.             Core Components/Risk  Factors/Patient Goals Review:   Goals and Risk Factor Review     Row Name 09/11/22 1705 10/04/22 1046 10/26/22 1319         Core Components/Risk Factors/Patient Goals Review   Personal Goals Review Weight Management/Obesity;Lipids;Stress;Diabetes;Hypertension Weight Management/Obesity;Lipids;Stress;Diabetes;Hypertension Weight Management/Obesity;Lipids;Stress;Diabetes;Hypertension     Review Erminda  started intensive cardiac rehab on 09/11/22 and did well with exercise. Systolic BP elevation noted on the recumbent bike will continue to montior BP Damie is doing well with exercise at  intensive cardiac rehab. Mazel has lost 2 kg since starting cardiac rehab. Vivi continues to  do well with exercise at  intensive cardiac rehab. Lilian has lost 2 kg since starting cardiac rehab. Blood pressures have been better controlled since her BP medications have been adjusted. Ryleigh will complete intensive cardiac rehab on 11/17/22     Expected Outcomes Briannon will continue to participate in intensive cardiac rehab for exercise, nutrition and lifestle modifications Marykay will continue to participate in intensive cardiac rehab for exercise, nutrition and lifestle modifications Jeffifer will continue to participate in intensive cardiac rehab for exercise, nutrition and lifestle modifications              Core Components/Risk Factors/Patient Goals at Discharge (Final Review):   Goals and Risk Factor Review - 10/26/22 1319       Core Components/Risk Factors/Patient Goals Review   Personal Goals Review Weight Management/Obesity;Lipids;Stress;Diabetes;Hypertension    Review Altha continues to  do well with exercise at  intensive cardiac rehab. Saman has lost 2 kg since starting cardiac rehab. Blood pressures have been better controlled since her BP medications have been adjusted. Areni will complete intensive cardiac rehab on 11/17/22    Expected Outcomes Amylah will continue to participate in intensive cardiac rehab for exercise, nutrition and lifestle modifications             ITP Comments:  ITP Comments     Row Name 09/05/22 1150 09/11/22 1658 10/04/22 1040 10/26/22 1310     ITP Comments Dr Armanda Magic MD, Medical Director, Introduction to Pritikin Education Program/Intensive Cardiac Rehab 30 Day ITP Review. Kelty started intensive cardiac rehab on 09/11/22 and did well with exercise 30 Day ITP Review.  Helene has good attendance and participation in intensive cardiac rehab 30 Day ITP Review. Jermiya has good attendance and participation in intensive cardiac rehab. Renota will complete intensive cardiac rehab on 11/17/22             Comments: See ITP comments.[name] RN BSN

## 2022-10-27 ENCOUNTER — Encounter (HOSPITAL_COMMUNITY)
Admission: RE | Admit: 2022-10-27 | Discharge: 2022-10-27 | Disposition: A | Discharge status: Home / Self Care | Payer: Medicare Other | Source: Ambulatory Visit | Attending: Cardiology | Admitting: Cardiology

## 2022-10-27 ENCOUNTER — Other Ambulatory Visit: Payer: Self-pay | Admitting: Cardiology

## 2022-10-27 DIAGNOSIS — Z955 Presence of coronary angioplasty implant and graft: Secondary | ICD-10-CM

## 2022-10-27 DIAGNOSIS — I2111 ST elevation (STEMI) myocardial infarction involving right coronary artery: Secondary | ICD-10-CM | POA: Diagnosis not present

## 2022-10-27 MED ORDER — EMPAGLIFLOZIN 10 MG PO TABS: 10.0000 mg | ORAL_TABLET | Freq: Every day | ORAL | 3 refills | Status: DC

## 2022-10-28 ENCOUNTER — Other Ambulatory Visit (HOSPITAL_COMMUNITY): Payer: Self-pay

## 2022-10-30 ENCOUNTER — Encounter (HOSPITAL_COMMUNITY)
Admission: RE | Admit: 2022-10-30 | Discharge: 2022-10-30 | Disposition: A | Discharge status: Home / Self Care | Payer: Medicare Other | Source: Ambulatory Visit | Attending: Cardiology | Admitting: Cardiology

## 2022-10-30 DIAGNOSIS — Z955 Presence of coronary angioplasty implant and graft: Secondary | ICD-10-CM | POA: Insufficient documentation

## 2022-10-30 DIAGNOSIS — I2111 ST elevation (STEMI) myocardial infarction involving right coronary artery: Secondary | ICD-10-CM | POA: Insufficient documentation

## 2022-10-30 NOTE — Telephone Encounter (Signed)
Lm to call back ./cy 

## 2022-10-30 NOTE — Progress Notes (Signed)
Before rehab, BP 150/80.[name] reported feeling tired and stressed. Her husband has a terminal illness, and she is worried. She is keeping a BP diary that she stated she sent to her PCP. She stated she could limit her sodium intake more. Recommended she speak with PCP regarding BP and medications, as well as getting counseling. She stated she has a great support group. [name] wanted to skip rehab today and try again on Wednesday.

## 2022-10-31 ENCOUNTER — Other Ambulatory Visit: Payer: Self-pay | Admitting: *Deleted

## 2022-10-31 MED ORDER — CARVEDILOL 25 MG PO TABS: 25.0000 mg | ORAL_TABLET | Freq: Two times a day (BID) | ORAL | 3 refills | Status: DC

## 2022-11-01 ENCOUNTER — Encounter (HOSPITAL_COMMUNITY)
Admission: RE | Admit: 2022-11-01 | Discharge: 2022-11-01 | Disposition: A | Discharge status: Home / Self Care | Payer: Medicare Other | Source: Ambulatory Visit | Attending: Cardiology | Admitting: Cardiology

## 2022-11-01 DIAGNOSIS — Z955 Presence of coronary angioplasty implant and graft: Secondary | ICD-10-CM

## 2022-11-01 DIAGNOSIS — I2111 ST elevation (STEMI) myocardial infarction involving right coronary artery: Secondary | ICD-10-CM

## 2022-11-03 ENCOUNTER — Encounter (HOSPITAL_COMMUNITY): Payer: Medicare Other

## 2022-11-06 ENCOUNTER — Encounter (HOSPITAL_COMMUNITY)
Admission: RE | Admit: 2022-11-06 | Discharge: 2022-11-06 | Disposition: A | Discharge status: Home / Self Care | Payer: Medicare Other | Source: Ambulatory Visit | Attending: Cardiology | Admitting: Cardiology

## 2022-11-06 ENCOUNTER — Other Ambulatory Visit (HOSPITAL_COMMUNITY): Payer: Self-pay

## 2022-11-06 DIAGNOSIS — Z955 Presence of coronary angioplasty implant and graft: Secondary | ICD-10-CM

## 2022-11-06 DIAGNOSIS — I2111 ST elevation (STEMI) myocardial infarction involving right coronary artery: Secondary | ICD-10-CM

## 2022-11-08 ENCOUNTER — Encounter (HOSPITAL_COMMUNITY)
Admission: RE | Admit: 2022-11-08 | Discharge: 2022-11-08 | Disposition: A | Discharge status: Home / Self Care | Payer: Medicare Other | Source: Ambulatory Visit | Attending: Cardiology | Admitting: Cardiology

## 2022-11-08 DIAGNOSIS — I2111 ST elevation (STEMI) myocardial infarction involving right coronary artery: Secondary | ICD-10-CM | POA: Diagnosis not present

## 2022-11-08 DIAGNOSIS — Z955 Presence of coronary angioplasty implant and graft: Secondary | ICD-10-CM

## 2022-11-09 ENCOUNTER — Other Ambulatory Visit: Payer: Self-pay | Admitting: Cardiology

## 2022-11-09 ENCOUNTER — Telehealth: Payer: Self-pay | Admitting: Cardiology

## 2022-11-09 ENCOUNTER — Other Ambulatory Visit (HOSPITAL_COMMUNITY): Payer: Self-pay

## 2022-11-09 MED ORDER — LOSARTAN POTASSIUM 25 MG PO TABS: 25.0000 mg | ORAL_TABLET | Freq: Every day | ORAL | 3 refills | Status: DC

## 2022-11-09 NOTE — Telephone Encounter (Signed)
Pt's medication was sent to pt's pharmacy as requested. Confirmation received.  °

## 2022-11-09 NOTE — Telephone Encounter (Signed)
*  STAT* If patient is at the pharmacy, call can be transferred to refill team.   1. Which medications need to be refilled? (please list name of each medication and dose if known) Losartan  2. Which pharmacy/location (including street and city if local pharmacy) is medication to be sent to? [facility]   3. Do they need a 30 day or 90 day supply? 90 days and refills

## 2022-11-10 ENCOUNTER — Encounter (HOSPITAL_COMMUNITY)
Admission: RE | Admit: 2022-11-10 | Discharge: 2022-11-10 | Disposition: A | Discharge status: Home / Self Care | Payer: Medicare Other | Source: Ambulatory Visit | Attending: Cardiology | Admitting: Cardiology

## 2022-11-10 DIAGNOSIS — I2111 ST elevation (STEMI) myocardial infarction involving right coronary artery: Secondary | ICD-10-CM

## 2022-11-10 DIAGNOSIS — Z955 Presence of coronary angioplasty implant and graft: Secondary | ICD-10-CM

## 2022-11-13 ENCOUNTER — Encounter (HOSPITAL_COMMUNITY)
Admission: RE | Admit: 2022-11-13 | Discharge: 2022-11-13 | Disposition: A | Discharge status: Home / Self Care | Payer: Medicare Other | Source: Ambulatory Visit | Attending: Cardiology | Admitting: Cardiology

## 2022-11-13 DIAGNOSIS — I2111 ST elevation (STEMI) myocardial infarction involving right coronary artery: Secondary | ICD-10-CM

## 2022-11-13 DIAGNOSIS — Z955 Presence of coronary angioplasty implant and graft: Secondary | ICD-10-CM

## 2022-11-15 ENCOUNTER — Encounter (HOSPITAL_COMMUNITY)
Admission: RE | Admit: 2022-11-15 | Discharge: 2022-11-15 | Disposition: A | Discharge status: Home / Self Care | Payer: Medicare Other | Source: Ambulatory Visit | Attending: Cardiology | Admitting: Cardiology

## 2022-11-15 VITALS — Ht 62.5 in | Wt 141.1 lb

## 2022-11-15 DIAGNOSIS — Z955 Presence of coronary angioplasty implant and graft: Secondary | ICD-10-CM

## 2022-11-15 DIAGNOSIS — I2111 ST elevation (STEMI) myocardial infarction involving right coronary artery: Secondary | ICD-10-CM | POA: Diagnosis not present

## 2022-11-15 DIAGNOSIS — Z79899 Other long term (current) drug therapy: Secondary | ICD-10-CM

## 2022-11-15 DIAGNOSIS — I10 Essential (primary) hypertension: Secondary | ICD-10-CM

## 2022-11-15 MED ORDER — AMLODIPINE BESYLATE 5 MG PO TABS
5.0000 mg | ORAL_TABLET | Freq: Every day | ORAL | 0 refills | Status: DC
Start: 2022-11-15 — End: 2023-01-23

## 2022-11-15 NOTE — Progress Notes (Signed)
Discharge Progress Report  Patient Details  Name: SHRILEY JOFFE MRN: [id number] Date of Birth: 02/04/1951 Referring Provider:   Flowsheet Row INTENSIVE CARDIAC REHAB ORIENT from 09/05/2022 in [facility]  Referring Provider Donato Schultz, MD        Number of Visits: 31  Reason for Discharge:  Patient reached a stable level of exercise. Patient independent in their exercise. Patient has met program and personal goals.  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  ADL UCSD:   Initial Exercise Prescription:  Initial Exercise Prescription - 09/05/22 1400       Date of Initial Exercise RX and Referring Provider   Date 09/05/22    Referring Provider Donato Schultz, MD    Expected Discharge Date 11/17/22      Recumbant Bike   Level 1.5    RPM 60    Watts 25    Minutes 15    METs 2.6      NuStep   Level 2    SPM 75    Minutes 15    METs 2.6      Prescription Details   Frequency (times per week) 3    Duration Progress to 30 minutes of continuous aerobic without signs/symptoms of physical distress      Intensity   THRR 40-80% of Max Heartrate 59-118    Ratings of Perceived Exertion 11-13    Perceived Dyspnea 0-4      Progression   Progression Continue progressive overload as per policy without signs/symptoms or physical distress.      Resistance Training   Training Prescription Yes    Weight 2 lbs    Reps 10-15             Discharge Exercise Prescription (Final Exercise Prescription Changes):  Exercise Prescription Changes - 11/20/22 1622       Response to Exercise   Blood  Pressure (Admit) 140/72    Blood Pressure (Exercise) 140/70    Blood Pressure (Exit) 122/70    Heart Rate (Admit) 80 bpm    Heart Rate (Exercise) 106 bpm    Heart Rate (Exit) 85 bpm    Rating of Perceived Exertion (Exercise) 11    Perceived Dyspnea (Exercise) 0    Symptoms none    Comments Pt graduated the [employer] program    Duration Progress to 30 minutes of  aerobic without signs/symptoms of physical distress    Intensity THRR unchanged      Progression   Progression Continue to progress workloads to maintain intensity without signs/symptoms of physical distress.    Average METs 3      Resistance Training   Training Prescription Yes    Weight 3 lbs wts    Reps 10-15    Time 10 Minutes      Recumbant Bike   Level 3    RPM 62    Watts 29    Minutes 15    METs 3      NuStep   Level 4    SPM 100    Minutes 15  METs 3      Home Exercise Plan   Plans to continue exercise at Home (comment)    Frequency Add 4 additional days to program exercise sessions.    Initial Home Exercises Provided 10/11/22             Functional Capacity:  6 Minute Walk     Row Name 09/05/22 1145 11/15/22 1625       6 Minute Walk   Phase Initial Discharge    Distance 1227 feet 1601 feet    Distance % Change -- 30.48 %    Distance Feet Change -- 374 ft    Walk Time 6 minutes 6 minutes    # of Rest Breaks 0 0    MPH 2.32 3.03    METS 2.6 3.18    RPE 9 11    Perceived Dyspnea  0 0    VO2 Peak 9.11 11.12    Symptoms -- No    Comments 4/10 bilateral foot pain --    Resting HR 72 bpm 73 bpm    Resting BP 144/80 138/84    Resting Oxygen Saturation  100 % --    Exercise Oxygen Saturation  during 6 min walk 100 % --    Max Ex. HR 93 bpm 78 bpm    Max Ex. BP 146/80 150/62    2 Minute Post BP 136/82 138/62             6 Minute Walk     Row Name 09/05/22 1145 11/15/22 1625       6 Minute Walk   Phase Initial Discharge    Distance 1227 feet 1601 feet    Distance %  Change -- 30.48 %    Distance Feet Change -- 374 ft    Walk Time 6 minutes 6 minutes    # of Rest Breaks 0 0    MPH 2.32 3.03    METS 2.6 3.18    RPE 9 11    Perceived Dyspnea  0 0    VO2 Peak 9.11 11.12    Symptoms -- No    Comments 4/10 bilateral foot pain --    Resting HR 72 bpm 73 bpm    Resting BP 144/80 138/84    Resting Oxygen Saturation  100 % --    Exercise Oxygen Saturation  during 6 min walk 100 % --    Max Ex. HR 93 bpm 78 bpm    Max Ex. BP 146/80 150/62    2 Minute Post BP 136/82 138/62             Psychological, QOL, Others - Outcomes: PHQ 2/9:    11/15/2022    4:08 PM 09/05/2022    2:28 PM  Depression screen PHQ 2/9  Decreased Interest 0 1  Down, Depressed, Hopeless 0 2  PHQ - 2 Score 0 3  Altered sleeping 1 3  Tired, decreased energy 1 2  Change in appetite 0 2  Feeling bad or failure about yourself  0 1  Trouble concentrating 0 1  Moving slowly or fidgety/restless 0 1  Suicidal thoughts 1 0  PHQ-9 Score 3 13  Difficult doing work/chores Not difficult at all Somewhat difficult    Quality of Life:  Quality of Life - 11/15/22 1638       Quality of Life   Select Quality of Life      Quality of Life Scores   Health/Function Post 25.43 %  Socioeconomic Post 26.21 %    Psych/Spiritual Post 26.57 %    Family Post 24 %    GLOBAL Post 25.62 %             Personal Goals: Goals established at orientation with interventions provided to work toward goal.  Personal Goals and Risk Factors at Admission - 09/05/22 1433       Core Components/Risk Factors/Patient Goals on Admission   Diabetes Yes    Intervention Provide education about signs/symptoms and action to take for hypo/hyperglycemia.;Provide education about proper nutrition, including hydration, and aerobic/resistive exercise prescription along with prescribed medications to achieve blood glucose in normal ranges: Fasting glucose 65-99 mg/dL    Expected Outcomes Short Term: Participant  verbalizes understanding of the signs/symptoms and immediate care of hyper/hypoglycemia, proper foot care and importance of medication, aerobic/resistive exercise and nutrition plan for blood glucose control.;Long Term: Attainment of HbA1C < 7%.    Hypertension Yes    Intervention Provide education on lifestyle modifcations including regular physical activity/exercise, weight management, moderate sodium restriction and increased consumption of fresh fruit, vegetables, and low fat dairy, alcohol moderation, and smoking cessation.;Monitor prescription use compliance.    Expected Outcomes Short Term: Continued assessment and intervention until BP is < 140/43mm HG in hypertensive participants. < 130/78mm HG in hypertensive participants with diabetes, heart failure or chronic kidney disease.;Long Term: Maintenance of blood pressure at goal levels.    Lipids Yes    Intervention Provide education and support for participant on nutrition & aerobic/resistive exercise along with prescribed medications to achieve LDL 70mg , HDL >40mg .    Expected Outcomes Short Term: Participant states understanding of desired cholesterol values and is compliant with medications prescribed. Participant is following exercise prescription and nutrition guidelines.;Long Term: Cholesterol controlled with medications as prescribed, with individualized exercise RX and with personalized nutrition plan. Value goals: LDL < 70mg , HDL > 40 mg.    Stress Yes    Intervention Offer individual and/or small group education and counseling on adjustment to heart disease, stress management and health-related lifestyle change. Teach and support self-help strategies.;Refer participants experiencing significant psychosocial distress to appropriate mental health specialists for further evaluation and treatment. When possible, include family members and significant others in education/counseling sessions.    Expected Outcomes Short Term: Participant  demonstrates changes in health-related behavior, relaxation and other stress management skills, ability to obtain effective social support, and compliance with psychotropic medications if prescribed.;Long Term: Emotional wellbeing is indicated by absence of clinically significant psychosocial distress or social isolation.              Personal Goals Discharge:  Goals and Risk Factor Review     Row Name 09/11/22 1705 10/04/22 1046 10/26/22 1319         Core Components/Risk Factors/Patient Goals Review   Personal Goals Review Weight Management/Obesity;Lipids;Stress;Diabetes;Hypertension Weight Management/Obesity;Lipids;Stress;Diabetes;Hypertension Weight Management/Obesity;Lipids;Stress;Diabetes;Hypertension     Review Tashena started intensive cardiac rehab on 09/11/22 and did well with exercise. Systolic BP elevation noted on the recumbent bike will continue to montior BP Jozlyn is doing well with exercise at  intensive cardiac rehab. Sarra has lost 2 kg since starting cardiac rehab. Helyne continues to  do well with exercise at  intensive cardiac rehab. Jamani has lost 2 kg since starting cardiac rehab. Blood pressures have been better controlled since her BP medications have been adjusted. Wing will complete intensive cardiac rehab on 11/17/22     Expected Outcomes Davinity will continue to participate in intensive cardiac rehab for exercise, nutrition  and lifestle modifications Leira will continue to participate in intensive cardiac rehab for exercise, nutrition and lifestle modifications Elainah will continue to participate in intensive cardiac rehab for exercise, nutrition and lifestle modifications              Goals and Risk Factor Review     Row Name 09/11/22 1705 10/04/22 1046 10/26/22 1319         Core Components/Risk Factors/Patient Goals Review   Personal Goals Review Weight Management/Obesity;Lipids;Stress;Diabetes;Hypertension Weight  Management/Obesity;Lipids;Stress;Diabetes;Hypertension Weight Management/Obesity;Lipids;Stress;Diabetes;Hypertension     Review Ryian started intensive cardiac rehab on 09/11/22 and did well with exercise. Systolic BP elevation noted on the recumbent bike will continue to montior BP Esme is doing well with exercise at  intensive cardiac rehab. Kehaulani has lost 2 kg since starting cardiac rehab. Wyona continues to  do well with exercise at  intensive cardiac rehab. Theresa has lost 2 kg since starting cardiac rehab. Blood pressures have been better controlled since her BP medications have been adjusted. Leandra will complete intensive cardiac rehab on 11/17/22     Expected Outcomes Finnlee will continue to participate in intensive cardiac rehab for exercise, nutrition and lifestle modifications Jamille will continue to participate in intensive cardiac rehab for exercise, nutrition and lifestle modifications Marena will continue to participate in intensive cardiac rehab for exercise, nutrition and lifestle modifications              Exercise Goals and Review:  Exercise Goals     Row Name 09/05/22 1426             Exercise Goals   Increase Physical Activity Yes       Intervention Provide advice, education, support and counseling about physical activity/exercise needs.;Develop an individualized exercise prescription for aerobic and resistive training based on initial evaluation findings, risk stratification, comorbidities and participant's personal goals.       Expected Outcomes Short Term: Attend rehab on a regular basis to increase amount of physical activity.;Long Term: Add in home exercise to make exercise part of routine and to increase amount of physical activity.;Long Term: Exercising regularly at least 3-5 days a week.       Increase Strength and Stamina Yes       Intervention Provide advice, education, support and counseling about physical activity/exercise needs.;Develop an individualized  exercise prescription for aerobic and resistive training based on initial evaluation findings, risk stratification, comorbidities and participant's personal goals.       Expected Outcomes Short Term: Increase workloads from initial exercise prescription for resistance, speed, and METs.;Short Term: Perform resistance training exercises routinely during rehab and add in resistance training at home;Long Term: Improve cardiorespiratory fitness, muscular endurance and strength as measured by increased METs and functional capacity ( )       Able to understand and use rate of perceived exertion (RPE) scale Yes       Intervention Provide education and explanation on how to use RPE scale       Expected Outcomes Short Term: Able to use RPE daily in rehab to express subjective intensity level;Long Term:  Able to use RPE to guide intensity level when exercising independently       Knowledge and understanding of Target Heart Rate Range (THRR) Yes       Intervention Provide education and explanation of THRR including how the numbers were predicted and where they are located for reference       Expected Outcomes Short Term: Able to state/look up THRR;Short Term: Able to  use daily as guideline for intensity in rehab;Long Term: Able to use THRR to govern intensity when exercising independently       Understanding of Exercise Prescription Yes       Intervention Provide education, explanation, and written materials on patient's individual exercise prescription       Expected Outcomes Short Term: Able to explain program exercise prescription;Long Term: Able to explain home exercise prescription to exercise independently                Exercise Goals     Row Name 09/05/22 1426             Exercise Goals   Increase Physical Activity Yes       Intervention Provide advice, education, support and counseling about physical activity/exercise needs.;Develop an individualized exercise prescription for aerobic and  resistive training based on initial evaluation findings, risk stratification, comorbidities and participant's personal goals.       Expected Outcomes Short Term: Attend rehab on a regular basis to increase amount of physical activity.;Long Term: Add in home exercise to make exercise part of routine and to increase amount of physical activity.;Long Term: Exercising regularly at least 3-5 days a week.       Increase Strength and Stamina Yes       Intervention Provide advice, education, support and counseling about physical activity/exercise needs.;Develop an individualized exercise prescription for aerobic and resistive training based on initial evaluation findings, risk stratification, comorbidities and participant's personal goals.       Expected Outcomes Short Term: Increase workloads from initial exercise prescription for resistance, speed, and METs.;Short Term: Perform resistance training exercises routinely during rehab and add in resistance training at home;Long Term: Improve cardiorespiratory fitness, muscular endurance and strength as measured by increased METs and functional capacity ( )       Able to understand and use rate of perceived exertion (RPE) scale Yes       Intervention Provide education and explanation on how to use RPE scale       Expected Outcomes Short Term: Able to use RPE daily in rehab to express subjective intensity level;Long Term:  Able to use RPE to guide intensity level when exercising independently       Knowledge and understanding of Target Heart Rate Range (THRR) Yes       Intervention Provide education and explanation of THRR including how the numbers were predicted and where they are located for reference       Expected Outcomes Short Term: Able to state/look up THRR;Short Term: Able to use daily as guideline for intensity in rehab;Long Term: Able to use THRR to govern intensity when exercising independently       Understanding of Exercise Prescription Yes        Intervention Provide education, explanation, and written materials on patient's individual exercise prescription       Expected Outcomes Short Term: Able to explain program exercise prescription;Long Term: Able to explain home exercise prescription to exercise independently                Exercise Goals Re-Evaluation:  Exercise Goals Re-Evaluation     Row Name 09/11/22 1710 09/27/22 1656 10/23/22 1653 11/20/22 1625       Exercise Goal Re-Evaluation   Exercise Goals Review Increase Physical Activity;Understanding of Exercise Prescription;Increase Strength and Stamina;Knowledge and understanding of Target Heart Rate Range (THRR);Able to understand and use rate of perceived exertion (RPE) scale Increase Physical Activity;Understanding of Exercise Prescription;Increase Strength and Stamina;Knowledge  and understanding of Target Heart Rate Range (THRR);Able to understand and use rate of perceived exertion (RPE) scale Increase Physical Activity;Understanding of Exercise Prescription;Increase Strength and Stamina;Knowledge and understanding of Target Heart Rate Range (THRR);Able to understand and use rate of perceived exertion (RPE) scale Increase Physical Activity;Understanding of Exercise Prescription;Increase Strength and Stamina;Knowledge and understanding of Target Heart Rate Range (THRR);Able to understand and use rate of perceived exertion (RPE) scale    Comments Pt first day in the [facility] program. Pt tolerated exercise well with an average MET level of 1.75. Pt is learning her THRR, RPE and ExRx Reviewed METs and goals with pt today. Pt is tolerating exercise well with an avg MET level of 1.9. Discussed with pt increasing RPMs on recumbant bike, however pt is limited by chronic knee pain. Pt said she is willing to try next session, but discussed switching equipment if knee pain persist or worsens. Pt feels good about her goals of increasing energy, travelling again, and building confidence. Pt  states that exercising has helped her stress levels and she very much enjoys coming to class because she is having fun and feeling stronger. Reviewed METs and goals with pt today. Pt is tolerating exercise well with an avg MET level of 2.85. Pt is progressing well and is feeling good with her progress. She has increased her MET's and stamina over time and is gaining a lot of confidence. She has also wanted to get back into travel and well be able to travel with her sisters in a few weeks. She is excited and confident. Pt graduated the [employer]. Pt is tolerating exercise well with an avg MET level of 3.0. Pt feels good with the progress shes made. She increased her distance by 374 ft for a total of 1601 ft on her post walk test. She also decreased her weight by over 7 lbs. [facility], walking and using her home stepper 5-7 days for 30-45 mins per session.    Expected Outcomes Will continue to monitor pt and progress WL as tolerated without sign or symptom Will continue to progress workloads as tolerated without s/sx. Will continue to progress workloads as tolerated without s/sx. Pt will continue to exercise on her own and gain strength             Exercise Goals Re-Evaluation     Row Name 09/11/22 1710 09/27/22 1656 10/23/22 1653 11/20/22 1625       Exercise Goal Re-Evaluation   Exercise Goals Review Increase Physical Activity;Understanding of Exercise Prescription;Increase Strength and Stamina;Knowledge and understanding of Target Heart Rate Range (THRR);Able to understand and use rate of perceived exertion (RPE) scale Increase Physical Activity;Understanding of Exercise Prescription;Increase Strength and Stamina;Knowledge and understanding of Target Heart Rate Range (THRR);Able to understand and use rate of perceived exertion (RPE) scale Increase Physical Activity;Understanding of Exercise Prescription;Increase Strength and Stamina;Knowledge and  understanding of Target Heart Rate Range (THRR);Able to understand and use rate of perceived exertion (RPE) scale Increase Physical Activity;Understanding of Exercise Prescription;Increase Strength and Stamina;Knowledge and understanding of Target Heart Rate Range (THRR);Able to understand and use rate of perceived exertion (RPE) scale    Comments Pt first day in the [facility] program. Pt tolerated exercise well with an average MET level of 1.75. Pt is learning her THRR, RPE and ExRx Reviewed METs and goals with pt today. Pt is tolerating exercise well with an avg MET level of 1.9. Discussed with pt increasing RPMs  on recumbant bike, however pt is limited by chronic knee pain. Pt said she is willing to try next session, but discussed switching equipment if knee pain persist or worsens. Pt feels good about her goals of increasing energy, travelling again, and building confidence. Pt states that exercising has helped her stress levels and she very much enjoys coming to class because she is having fun and feeling stronger. Reviewed METs and goals with pt today. Pt is tolerating exercise well with an avg MET level of 2.85. Pt is progressing well and is feeling good with her progress. She has increased her MET's and stamina over time and is gaining a lot of confidence. She has also wanted to get back into travel and well be able to travel with her sisters in a few weeks. She is excited and confident. Pt graduated the [employer]. Pt is tolerating exercise well with an avg MET level of 3.0. Pt feels good with the progress shes made. She increased her distance by 374 ft for a total of 1601 ft on her post walk test. She also decreased her weight by over 7 lbs. She plans to work out at the senior center, walking and using her home stepper 5-7 days for 30-45 mins per session.    Expected Outcomes Will continue to monitor pt and progress WL as tolerated without sign or symptom Will continue to progress workloads  as tolerated without s/sx. Will continue to progress workloads as tolerated without s/sx. Pt will continue to exercise on her own and gain strength             Nutrition & Weight - Outcomes:  Pre Biometrics - 09/05/22 1146       Pre Biometrics   Waist Circumference 27.75 inches    Hip Circumference 38.5 inches    Waist to Hip Ratio 0.72 %    Triceps Skinfold 42 mm    % Body Fat 38.6 %    Grip Strength 79 kg    Flexibility 14.75 in    Single Leg Stand 6.87 seconds             Post Biometrics - 11/15/22 1628        Post  Biometrics   Height 5' 2.5" (1.588 m)    Weight 64 kg    Waist Circumference 32 inches    Hip Circumference 35 inches    Waist to Hip Ratio 0.91 %    BMI (Calculated) 25.38    Triceps Skinfold 35 mm    % Body Fat 38.5 %    Grip Strength 28 kg   Previous Pre entry was error, 19 grip strength   Flexibility 14.5 in    Single Leg Stand 8.8 seconds             Nutrition:  Nutrition Therapy & Goals - 11/10/22 1035       Nutrition Therapy   Diet Heart Healthy Diet    Drug/Food Interactions Statins/Certain Fruits      Personal Nutrition Goals   Nutrition Goal Patient to identify strategies for reducing cardiovascular risk by attending the [facility] education and nutrition series weekly.    Personal Goal #2 Patient to improve diet quality by using the plate method as a guide for meal planning to include lean protein/plant protein, fruits, vegetables, whole grains, nonfat dairy as part of a well-balanced diet.    Personal Goal #3 Patient to reduce sodium intake to 1500mg  per day    Comments Goals in  action. Icyss continues to attend the [facility] education and nurition series regularly. She has improved knowledge of benefits of reduced saturated fat intake, increased dietary fiber, and improved understanding of reading food labels for sodium and sugar. She is down 8.8# since starting with our program.  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 Patient will benefit from participation in intensive cardiac rehab for nutrition, exercise, and lifestyle modification.      Intervention Plan   Intervention Prescribe, educate and counsel regarding individualized specific dietary modifications aiming towards targeted core components such as weight, hypertension, lipid management, diabetes, heart failure and other comorbidities.;Nutrition handout(s) given to patient.    Expected Outcomes Short Term Goal: Understand basic principles of dietary content, such as calories, fat, sodium, cholesterol and nutrients.;Long Term Goal: Adherence to prescribed nutrition plan.             Nutrition Discharge:  Nutrition Assessments - 11/23/22 1019       Rate Your Plate Scores   Post Score 86             Education Questionnaire Score:  Knowledge Questionnaire Score - 11/15/22 1639       Knowledge Questionnaire Score   Post Score 19/24             Goals reviewed with patient; copy given to patient.Pt graduates from  Intensive/Traditional cardiac rehab program 07/22 /24  with completion of  48 exercise and education sessions. Pt maintained good attendance and progressed nicely during their participation in rehab as evidenced by increased MET level.  Ahlia increased her distance on her post exercise walk test by 374 feet.  Medication list reconciled. Repeat  PHQ score- 3 .  Pt has made significant lifestyle changes and should be commended for their success. Taresa  achieved their goals during cardiac rehab.   Pt plans to continue exercise at the [place] [facility] and walking with her sister. Anelis said that participating in cardiac rehab has been helpful. We are proud of Allysa's progress! [name] RN BSN

## 2022-11-17 ENCOUNTER — Encounter (HOSPITAL_COMMUNITY): Payer: Medicare Other

## 2022-11-20 ENCOUNTER — Encounter (HOSPITAL_COMMUNITY)
Admission: RE | Admit: 2022-11-20 | Discharge: 2022-11-20 | Disposition: A | Discharge status: Home / Self Care | Payer: Medicare Other | Source: Ambulatory Visit | Attending: Cardiology | Admitting: Cardiology

## 2022-11-20 DIAGNOSIS — Z955 Presence of coronary angioplasty implant and graft: Secondary | ICD-10-CM

## 2022-11-20 DIAGNOSIS — I2111 ST elevation (STEMI) myocardial infarction involving right coronary artery: Secondary | ICD-10-CM | POA: Diagnosis not present

## 2022-11-30 ENCOUNTER — Encounter: Payer: Self-pay | Admitting: General Practice

## 2022-12-26 ENCOUNTER — Encounter: Payer: Self-pay | Admitting: Cardiology

## 2022-12-26 ENCOUNTER — Ambulatory Visit: Payer: Medicare Other | Attending: Cardiology | Admitting: Cardiology

## 2022-12-26 VITALS — BP 162/76 | HR 67 | Ht 62.5 in | Wt 138.0 lb

## 2022-12-26 DIAGNOSIS — Z79899 Other long term (current) drug therapy: Secondary | ICD-10-CM | POA: Diagnosis not present

## 2022-12-26 DIAGNOSIS — I2510 Atherosclerotic heart disease of native coronary artery without angina pectoris: Secondary | ICD-10-CM

## 2022-12-26 DIAGNOSIS — E785 Hyperlipidemia, unspecified: Secondary | ICD-10-CM

## 2022-12-26 DIAGNOSIS — I10 Essential (primary) hypertension: Secondary | ICD-10-CM

## 2022-12-26 NOTE — Patient Instructions (Signed)
Medication Instructions:  The current medical regimen is effective;  continue present plan and medications.  *If you need a refill on your cardiac medications before your next appointment, please call your pharmacy*  Follow-Up: At [facility], you and your health needs are our priority.  As part of our continuing mission to provide you with exceptional heart care, we have created designated Provider Care Teams.  These Care Teams include your primary Cardiologist (physician) and Advanced Practice Providers (APPs -  Physician Assistants and Nurse Practitioners) who all work together to provide you with the care you need, when you need it.  We recommend signing up for the patient portal called "MyChart".  Sign up information is provided on this After Visit Summary.  MyChart is used to connect with patients for Virtual Visits (Telemedicine).  Patients are able to view lab/test results, encounter notes, upcoming appointments, etc.  Non-urgent messages can be sent to your provider as well.   To learn more about what you can do with MyChart, go to [email or web address].    Your next appointment:   6 month(s)  Provider:   Ernest Dick, NP     Then, Mark Skains, MD will plan to see you again in 1 year(s).     

## 2022-12-26 NOTE — Progress Notes (Signed)
Cardiology Office Note:  .   Date:  12/26/2022  ID:  ESMAE HURTS, DOB [birth year], MRN [id number] PCP: Noberto Retort, MD  [facility] Providers Cardiologist:  Donato Schultz, MD    History of Present Illness: .   [name] is a 72 y.o. female Discussed the use of AI scribe software for clinical note transcription with the patient, who gave verbal consent to proceed.  History of Present Illness   The patient, with a history of coronary artery disease, type 2 diabetes, hypertension, hyperlipidemia, diastolic heart failure, asthma, GERD, CKD Stage III, and seizures, presents for a follow-up visit. She had a myocardial infarction in 2017 and an inferior STEMI in April 2024. The patient reports ongoing high blood pressure despite medication adjustments. She expresses frustration with the persistent high readings, particularly in the morning. The patient also mentions significant stress due to her husband's terminal cancer diagnosis. She has lost about thirty pounds since her last heart attack and has been participating in cardiac rehab. The patient denies any chest pain but mentions having knots and bruises all over her body. She also reports that she has been taken off all her asthma medications and is now only using an albuterol pump as needed.       ROS: No syncope, no bleeding  Studies Reviewed: Marland Kitchen   EKG Interpretation Date/Time:  Tuesday December 26 2022 09:41:20 EDT Ventricular Rate:  67 PR Interval:  152 QRS Duration:  78 QT Interval:  376 QTC Calculation: 397 R Axis:   35  Text Interpretation: Normal sinus rhythm Nonspecific T wave abnormality When compared with ECG of 23-Sep-2022 18:42, No significant change since last tracing Confirmed by Donato Schultz ([id number]) on 12/26/2022 9:49:13 AM    LABS LDL: 47 (08/12/2022) Hemoglobin A1c: 6.3 (08/12/2022) Creatinine: 1.18 (08/12/2022)  DIAGNOSTIC Echocardiogram: EF 60-65%, small pericardial effusion, no evidence of tamponade  (07/2022)  Cath April 2024: Diagnostic Dominance: Right  Intervention     Risk Assessment/Calculations:           Physical Exam:   VS:  BP (!) 162/76   Pulse 67   Ht 5' 2.5" (1.588 m)   Wt 138 lb (62.6 kg)   LMP  (LMP Unknown)   BMI 24.84 kg/m    Wt Readings from Last 3 Encounters:  12/26/22 138 lb (62.6 kg)  11/15/22 141 lb 1.5 oz (64 kg)  09/28/22 145 lb 6.4 oz (66 kg)    GEN: Well nourished, well developed in no acute distress NECK: No JVD; No carotid bruits CARDIAC: RRR, no murmurs, rubs, gallops RESPIRATORY:  Clear to auscultation without rales, wheezing or rhonchi  ABDOMEN: Soft, non-tender, non-distended EXTREMITIES:  No edema; No deformity, mild bruising.   ASSESSMENT AND PLAN: .    Assessment and Plan    Coronary Artery Disease Status post inferior STEMI in April 2024 treated with drug eluting stents, ostial RCA. Prior myocardial infarction in 2017 treated with DES distal RCA. No current chest pain. -Continue Aspirin 81mg  daily, Plavix 75mg  daily, Atorvastatin, Isosorbide 30mg  daily, Carvedilol 5mg  twice daily, Losartan 25mg  daily, and Amlodipine 5mg  daily.  Hypertension Elevated morning blood pressure readings despite Losartan, Carvedilol, and Amlodipine. Midday readings within normal range. -Continue current antihypertensive regimen. -Consider adjusting timing of medication doses. -Follow up with nephrologist for further management.  Type 2 Diabetes Hemoglobin A1c of 6.3 in April 2024. -Continue [name].  Hyperlipidemia LDL of 47 in April 2024. -Continue Atorvastatin. No myalgias  Diastolic Heart Failure Ejection fraction  of 60-65% in April 2024. -Continue current cardiac medications.  Asthma No recent asthma attacks. Discontinued all asthma medications except for Albuterol as needed. -Continue Albuterol as needed.  Follow-up in 6 months with Alden Server and in 1 year with me.              Signed, Donato Schultz, MD

## 2023-01-21 ENCOUNTER — Other Ambulatory Visit: Payer: Self-pay | Admitting: Nurse Practitioner

## 2023-01-21 DIAGNOSIS — I10 Essential (primary) hypertension: Secondary | ICD-10-CM

## 2023-01-21 DIAGNOSIS — Z79899 Other long term (current) drug therapy: Secondary | ICD-10-CM

## 2023-01-24 ENCOUNTER — Other Ambulatory Visit: Payer: Self-pay | Admitting: Cardiology

## 2023-01-25 ENCOUNTER — Ambulatory Visit: Payer: Medicare Other | Admitting: Cardiology

## 2023-02-02 ENCOUNTER — Telehealth: Payer: Self-pay | Admitting: Cardiology

## 2023-02-02 NOTE — Telephone Encounter (Signed)
Pt c/o medication issue:  1. Name of Medication: empagliflozin ([name]) 10 MG TABS tablet   2. How are you currently taking this medication (dosage and times per day)?   3. Are you having a reaction (difficulty breathing--STAT)? no  4. What is your medication issue? Patient is unable to afford medication. Calling to see other options. Please advise

## 2023-02-05 ENCOUNTER — Telehealth: Payer: Self-pay

## 2023-02-05 ENCOUNTER — Other Ambulatory Visit (HOSPITAL_COMMUNITY): Payer: Self-pay

## 2023-02-05 ENCOUNTER — Telehealth: Payer: Self-pay | Admitting: Cardiology

## 2023-02-05 NOTE — Telephone Encounter (Signed)
Spoke with patient and got HH Size (2) and AGI ([phone]).   Will start PAP process for patient.   Patient has 3 days left of [name]. Will see if samples are an option for patient while PAP is pending.

## 2023-02-05 NOTE — Telephone Encounter (Signed)
Pt c/o medication issue:  1. Name of Medication:  empagliflozin ([name]) 10 MG TABS tablet  2. How are you currently taking this medication (dosage and times per day)?   3. Are you having a reaction (difficulty breathing--STAT)?   4. What is your medication issue?    Patient states [name] increased to $380+ and she would like to discuss her options.

## 2023-02-05 NOTE — Telephone Encounter (Signed)
Spoke with patient and got HH Size and AGI.   Will start PAP process for patient.    Patient has 3 days left of [name]. Reached out to [facility] to see if samples are an option for patient while PAP is pending.

## 2023-02-06 ENCOUNTER — Other Ambulatory Visit (HOSPITAL_COMMUNITY): Payer: Self-pay

## 2023-02-06 MED ORDER — EMPAGLIFLOZIN 10 MG PO TABS: 10.0000 mg | ORAL_TABLET | Freq: Every day | ORAL | Status: DC

## 2023-02-06 NOTE — Telephone Encounter (Signed)
Called pt and left message informing pt that we were leaving 2 weeks of samples of [name] 10 mg tablets at [facility] front desk for pt to pick up and if she has any questions or concerns to give our office a call back. FYI

## 2023-02-06 NOTE — Telephone Encounter (Signed)
Patient Advocate Encounter   The patient was approved for a [employer] grant that will help cover the cost of [name] Total amount awarded, $10,000.  Effective: 01/07/23 - 01/06/24   BIN [id number] PCN [id number] [id number] ID [id number]   Pharmacy provided with approval and processing information. Patient informed via PHONE CALL, ALSO SENT GRANT INFO VIA MYCHART TO PT.  GRANT WILL WORK FOR [name], CARVEDILOL, LOSARTAN, AND ISOSORBIDE COPAYS   [name], CPhT  Pharmacy Patient Advocate Specialist  Direct Number: [phone] Fax: [phone]

## 2023-02-06 NOTE — Telephone Encounter (Signed)
February 06, 2023 [name], [place]  to JIA DOTTAVIO      02/06/23 11:32 AM Hi [name]! It was nice talking with you. I spoke with [facility] and told them to bill your [name], Isosorbide, Carvedilol, and Losartan with the grant secondary to your insurance. Here is a copy of the [employer] information for your records.    BIN: [id number] PCN: [id number] GROUP: [id number] ID: [id number]   Please reach out if you need anything else. Take care!    [name], CPhT  Pharmacy Patient Advocate Specialist  Direct Number: [phone] Fax: [phone]  This MyChart message has not been read. [name], [facility]      02/06/23 11:22 AM Note Patient Advocate Encounter   The patient was approved for a [employer] grant that will help cover the cost of [name] Total amount awarded, $10,000.  Effective: 01/07/23 - 01/06/24   BIN [id number] PCN [id number] Group [id number] ID [id number]   Pharmacy provided with approval and processing information. Patient informed via PHONE CALL, ALSO SENT GRANT INFO VIA MYCHART TO PT.   GRANT WILL WORK FOR [name], CARVEDILOL, LOSARTAN, AND ISOSORBIDE COPAYS    [name], CPhT  Pharmacy Patient Advocate Specialist  Direct Number: [phone] Fax: [phone]

## 2023-02-06 NOTE — Telephone Encounter (Signed)
Thank you Cathy.

## 2023-02-07 NOTE — Telephone Encounter (Signed)
Hi [name], just notifying you of approval :)

## 2023-02-10 ENCOUNTER — Other Ambulatory Visit: Payer: Self-pay | Admitting: Neurology

## 2023-02-10 ENCOUNTER — Other Ambulatory Visit: Payer: Self-pay | Admitting: Cardiology

## 2023-02-27 ENCOUNTER — Other Ambulatory Visit: Payer: Self-pay | Admitting: Internal Medicine

## 2023-02-27 DIAGNOSIS — J4550 Severe persistent asthma, uncomplicated: Secondary | ICD-10-CM

## 2023-02-27 DIAGNOSIS — J301 Allergic rhinitis due to pollen: Secondary | ICD-10-CM

## 2023-02-28 ENCOUNTER — Other Ambulatory Visit: Payer: Self-pay

## 2023-02-28 MED ORDER — ATORVASTATIN CALCIUM 80 MG PO TABS: 80.0000 mg | ORAL_TABLET | Freq: Every day | ORAL | 2 refills | Status: DC

## 2023-03-18 NOTE — Progress Notes (Unsigned)
  Cardiology Office Note:  .   Date:  03/18/2023  ID:  LELA FARRAN, DOB [birth year], MRN [id number] PCP: Noberto Retort, MD  [facility] Providers Cardiologist:  Donato Schultz, MD   }   History of Present Illness: .   [name] is a 72 y.o. female with a history of coronary artery disease, type 2 diabetes, hypertension, hyperlipidemia, diastolic heart failure, asthma, GERD, CKD Stage III, and seizures, presents for a follow-up visit. She had a myocardial infarction in 2017 and an inferior STEMI in April 2024.  Cardiac catheterization revealed high-grade proximal and ostial right coronary artery disease treated with shockwave lithotripsy and 1 drug-eluting stent, high-grade distal right coronary artery disease treated with drug-eluting stent with plaque shift into a jailed PDA branch.  She was recommended for dual antiplatelet therapy for at least 1 year  Last seen by Dr. Anne Fu on 12/26/2018 for and was found to be hypertensive and expressed a lot of stress due to her husband's terminal cancer diagnoses.  She was continued on medical management with DAPT, statin, isosorbide, carvedilol, losartan and amlodipine.  No medication changes were made in the setting of hypertension.  She requested office visit today due to bruising which has been chronic for her.  This was reported on last office visit with Dr. Anne Fu.  ROS: ***  Studies Reviewed: .        *** EKG Interpretation Date/Time:    Ventricular Rate:    PR Interval:    QRS Duration:    QT Interval:    QTC Calculation:   R Axis:      Text Interpretation:      Physical Exam:   VS:  LMP  (LMP Unknown)    Wt Readings from Last 3 Encounters:  12/26/22 138 lb (62.6 kg)  11/15/22 141 lb 1.5 oz (64 kg)  09/28/22 145 lb 6.4 oz (66 kg)    GEN: Well nourished, well developed in no acute distress NECK: No JVD; No carotid bruits CARDIAC: ***RRR, no murmurs, rubs, gallops RESPIRATORY:  Clear to auscultation without rales,  wheezing or rhonchi  ABDOMEN: Soft, non-tender, non-distended EXTREMITIES:  No edema; No deformity   ASSESSMENT AND PLAN: .   ***    {Are you ordering a CV Procedure (e.g. stress test, cath, DCCV, TEE, etc)?   Press F2        :[id number]}    Signed, Bettey Mare. Liborio Nixon, ANP, AACC

## 2023-03-20 ENCOUNTER — Encounter: Payer: Self-pay | Admitting: Adult Health

## 2023-03-20 ENCOUNTER — Ambulatory Visit: Payer: Medicare Other | Attending: Adult Health | Admitting: Adult Health

## 2023-03-20 VITALS — BP 144/82 | HR 75 | Ht 62.0 in | Wt 136.0 lb

## 2023-03-20 DIAGNOSIS — E7800 Pure hypercholesterolemia, unspecified: Secondary | ICD-10-CM

## 2023-03-20 DIAGNOSIS — Z79899 Other long term (current) drug therapy: Secondary | ICD-10-CM

## 2023-03-20 DIAGNOSIS — I5032 Chronic diastolic (congestive) heart failure: Secondary | ICD-10-CM

## 2023-03-20 DIAGNOSIS — I10 Essential (primary) hypertension: Secondary | ICD-10-CM | POA: Diagnosis not present

## 2023-03-20 DIAGNOSIS — I2510 Atherosclerotic heart disease of native coronary artery without angina pectoris: Secondary | ICD-10-CM

## 2023-03-20 MED ORDER — CARVEDILOL 25 MG PO TABS: 25.0000 mg | ORAL_TABLET | Freq: Two times a day (BID) | ORAL | 3 refills | Status: DC

## 2023-03-20 MED ORDER — CLOPIDOGREL BISULFATE 75 MG PO TABS: 75.0000 mg | ORAL_TABLET | Freq: Every day | ORAL | 2 refills | Status: DC

## 2023-03-20 MED ORDER — AMLODIPINE BESYLATE 5 MG PO TABS
5.0000 mg | ORAL_TABLET | Freq: Every day | ORAL | 3 refills | Status: DC
Start: 2023-03-20 — End: 2023-11-01

## 2023-03-20 MED ORDER — ISOSORBIDE MONONITRATE ER 30 MG PO TB24: 30.0000 mg | ORAL_TABLET | Freq: Every day | ORAL | 3 refills | Status: DC

## 2023-03-20 MED ORDER — LOSARTAN POTASSIUM 25 MG PO TABS: 25.0000 mg | ORAL_TABLET | Freq: Every day | ORAL | 3 refills | Status: DC

## 2023-03-20 NOTE — Patient Instructions (Signed)
Medication Instructions:  No Changes *If you need a refill on your cardiac medications before your next appointment, please call your pharmacy*   Lab Work: No Labs If you have labs (blood work) drawn today and your tests are completely normal, you will receive your results only by: MyChart Message (if you have MyChart) OR A paper copy in the mail If you have any lab test that is abnormal or we need to change your treatment, we will call you to review the results.   Testing/Procedures: No Testing   Follow-Up: At [facility], you and your health needs are our priority.  As part of our continuing mission to provide you with exceptional heart care, we have created designated Provider Care Teams.  These Care Teams include your primary Cardiologist (physician) and Advanced Practice Providers (APPs -  Physician Assistants and Nurse Practitioners) who all work together to provide you with the care you need, when you need it.  We recommend signing up for the patient portal called "MyChart".  Sign up information is provided on this After Visit Summary.  MyChart is used to connect with patients for Virtual Visits (Telemedicine).  Patients are able to view lab/test results, encounter notes, upcoming appointments, etc.  Non-urgent messages can be sent to your provider as well.   To learn more about what you can do with MyChart, go to [email or web address].    Your next appointment:   6 month(s)  Provider:   Donato Schultz, MD

## 2023-04-24 ENCOUNTER — Ambulatory Visit: Payer: Medicare Other | Admitting: Internal Medicine

## 2023-04-27 ENCOUNTER — Telehealth: Payer: Self-pay | Admitting: *Deleted

## 2023-04-27 DIAGNOSIS — M79605 Pain in left leg: Secondary | ICD-10-CM

## 2023-04-27 NOTE — Telephone Encounter (Signed)
Order placed as requested by Dr [name] [facility] [phone] phone [phone] fax Per Dr Donato Schultz.  Pt to be contacted to be scheduled for bilateral LEAs.

## 2023-04-27 NOTE — Telephone Encounter (Signed)
Pt has been scheduled for 05/17/2023 for LEAs.

## 2023-05-01 ENCOUNTER — Ambulatory Visit
Admission: RE | Admit: 2023-05-01 | Discharge: 2023-05-01 | Disposition: A | Discharge status: Home / Self Care | Payer: Medicare Other | Source: Ambulatory Visit | Attending: Nurse Practitioner | Admitting: Nurse Practitioner

## 2023-05-01 ENCOUNTER — Other Ambulatory Visit: Payer: Self-pay | Admitting: Nurse Practitioner

## 2023-05-01 DIAGNOSIS — K5900 Constipation, unspecified: Secondary | ICD-10-CM

## 2023-05-03 DIAGNOSIS — R197 Diarrhea, unspecified: Secondary | ICD-10-CM | POA: Diagnosis not present

## 2023-05-17 ENCOUNTER — Ambulatory Visit (HOSPITAL_COMMUNITY)
Admission: RE | Admit: 2023-05-17 | Discharge: 2023-05-17 | Disposition: A | Discharge status: Home / Self Care | Payer: Self-pay | Source: Ambulatory Visit | Attending: Internal Medicine | Admitting: Internal Medicine

## 2023-05-17 ENCOUNTER — Ambulatory Visit (HOSPITAL_BASED_OUTPATIENT_CLINIC_OR_DEPARTMENT_OTHER)
Admission: RE | Admit: 2023-05-17 | Discharge: 2023-05-17 | Disposition: A | Discharge status: Home / Self Care | Payer: Self-pay | Source: Ambulatory Visit | Attending: Cardiology | Admitting: Cardiology

## 2023-05-17 DIAGNOSIS — M79604 Pain in right leg: Secondary | ICD-10-CM

## 2023-05-17 DIAGNOSIS — M79605 Pain in left leg: Secondary | ICD-10-CM

## 2023-05-21 ENCOUNTER — Encounter: Payer: Self-pay | Admitting: Cardiology

## 2023-05-21 LAB — VAS US ABI WITH/WO TBI
Left ABI: 1.18
Right ABI: 1.22

## 2023-06-05 DIAGNOSIS — N1831 Chronic kidney disease, stage 3a: Secondary | ICD-10-CM | POA: Diagnosis not present

## 2023-06-12 ENCOUNTER — Other Ambulatory Visit (HOSPITAL_COMMUNITY): Payer: Self-pay

## 2023-07-11 DIAGNOSIS — B351 Tinea unguium: Secondary | ICD-10-CM | POA: Diagnosis not present

## 2023-07-11 DIAGNOSIS — M2012 Hallux valgus (acquired), left foot: Secondary | ICD-10-CM | POA: Diagnosis not present

## 2023-07-11 DIAGNOSIS — E0840 Diabetes mellitus due to underlying condition with diabetic neuropathy, unspecified: Secondary | ICD-10-CM | POA: Diagnosis not present

## 2023-07-11 DIAGNOSIS — I739 Peripheral vascular disease, unspecified: Secondary | ICD-10-CM | POA: Diagnosis not present

## 2023-07-11 DIAGNOSIS — M79675 Pain in left toe(s): Secondary | ICD-10-CM | POA: Diagnosis not present

## 2023-08-09 ENCOUNTER — Ambulatory Visit: Payer: Medicare Other | Admitting: Neurology

## 2023-08-15 DIAGNOSIS — N1832 Chronic kidney disease, stage 3b: Secondary | ICD-10-CM | POA: Diagnosis not present

## 2023-08-15 DIAGNOSIS — I10 Essential (primary) hypertension: Secondary | ICD-10-CM | POA: Diagnosis not present

## 2023-08-15 DIAGNOSIS — I2510 Atherosclerotic heart disease of native coronary artery without angina pectoris: Secondary | ICD-10-CM | POA: Diagnosis not present

## 2023-08-15 DIAGNOSIS — E1142 Type 2 diabetes mellitus with diabetic polyneuropathy: Secondary | ICD-10-CM | POA: Diagnosis not present

## 2023-08-15 DIAGNOSIS — E7800 Pure hypercholesterolemia, unspecified: Secondary | ICD-10-CM | POA: Diagnosis not present

## 2023-08-15 DIAGNOSIS — J4550 Severe persistent asthma, uncomplicated: Secondary | ICD-10-CM | POA: Diagnosis not present

## 2023-08-15 DIAGNOSIS — F419 Anxiety disorder, unspecified: Secondary | ICD-10-CM | POA: Diagnosis not present

## 2023-08-15 DIAGNOSIS — K219 Gastro-esophageal reflux disease without esophagitis: Secondary | ICD-10-CM | POA: Diagnosis not present

## 2023-08-15 DIAGNOSIS — G40909 Epilepsy, unspecified, not intractable, without status epilepticus: Secondary | ICD-10-CM | POA: Diagnosis not present

## 2023-08-15 DIAGNOSIS — R413 Other amnesia: Secondary | ICD-10-CM | POA: Diagnosis not present

## 2023-08-15 DIAGNOSIS — I5022 Chronic systolic (congestive) heart failure: Secondary | ICD-10-CM | POA: Diagnosis not present

## 2023-08-15 DIAGNOSIS — F5101 Primary insomnia: Secondary | ICD-10-CM | POA: Diagnosis not present

## 2023-09-04 ENCOUNTER — Encounter: Payer: Self-pay | Admitting: Neurology

## 2023-09-04 ENCOUNTER — Ambulatory Visit: Payer: Medicare Other | Admitting: Neurology

## 2023-09-04 VITALS — BP 146/83 | Ht 62.0 in | Wt 135.5 lb

## 2023-09-04 DIAGNOSIS — G3184 Mild cognitive impairment, so stated: Secondary | ICD-10-CM | POA: Diagnosis not present

## 2023-09-04 DIAGNOSIS — R569 Unspecified convulsions: Secondary | ICD-10-CM | POA: Diagnosis not present

## 2023-09-04 MED ORDER — MEMANTINE HCL 10 MG PO TABS: 10.0000 mg | ORAL_TABLET | Freq: Two times a day (BID) | ORAL | 3 refills | Status: AC

## 2023-09-04 MED ORDER — DIVALPROEX SODIUM ER 500 MG PO TB24: 500.0000 mg | ORAL_TABLET | Freq: Every day | ORAL | 3 refills | Status: AC

## 2023-09-04 NOTE — Patient Instructions (Signed)
 Great to see you today.  Will check labs today.  Please continue Depakote  for seizure prevention.  Continue Namenda  for memory.  Recommend continue exercise, brain stimulating activity.  Continue follow-up with primary care for management of vascular risk factors.  Follow-up in 1 year.  Thanks!!

## 2023-09-04 NOTE — Progress Notes (Signed)
 HISTORY OF PRESENT ILLNESS:[name] is a 73 years old left-handed female, seen in refer by her primary care doctor Elyn Han for evaluation of episodes of confusion on January 27 2016.  I reviewed and summarized the referring note, she had a history of type 2 diabetes, hypertension, hyperlipidemia, pulmonary emboli and, depression, COPD, coronary artery disease, stent placement following a heart attack in January 18 2016, I reviewed echo report on January 18 2016, ejection fraction 65-70%, severe concentric hypertrophy, septal wall thickness was increased, with mild hypertrophy of the posterior wall, abnormal relaxation, increased filling pressure.  Laboratory evaluation BMP showed elevated glucose 270, creatinine 1.06, cholesterol 207, LDL 110, A1c 7.0,   She had her first generalized seizure in 2011, was taken to [facility] hospital evaluated by neurologist there, per patient, there was a suggestion of antiepileptic medications, however it was never followed through.   Over the years, she had no generalized seizure, but every few weeks to every few months, she would have episodes of sudden onset confusion, space out, lasting less than 5 minutes   She went through extreme stress (her grandson died) in 02-05-2017since then, she began to have increased spells, almost daily basis now, she noted when she was reading, cooking, has transient time elapsed, she came to confused, sometimes preceded by nausea dry mouth sensation. She denies family history of seizure, she works as a Education officer, community at [employer].   UPDATE May 11 2016:Jacqueline She is now taking Depakote  ER 500 mg every night, which has helped her tremendously, she no longer has recurrent spells,  EEG was normal in October 2017  We have personally reviewed MRI of the brain in November 2017, multiple periventricular and subcortical punctate scattered chronic small vessel disease, increased T2 flare hyperintensity in  the mesial temporal lobe, right hippocampus is slightly smaller than the left,   Update March 20, 2019: She had no recurrent seizure, last seizure was in 2016, tolerating Depakote  ER 500 mg 2 tablets at nighttime,   Today her main concern is intermittent memory loss, she retired as a Journalist, newspaper recently, she was noted to have difficulty for the store layout since 2019, she denied a family history of dementia.   UPDATE August 2021: She retired recently, no recurrent seizure, personally reviewed MRI of the brain without contrast, generalized atrophy, supratentorial small vessel disease, no change compared to previous scan in 2017, ORI g or due to elevated intracranial pressures.  It appears stable compared to the previous MRI. Laboratory evaluation May 2021, A1c 6.5, BMP showed elevated creatinine 1.5, glucose 307, potassium 3.3, CBC showed mild decreased hemoglobin of 11.1, November 2020, normal TSH, B12, RPR, depakote  level 80s.  While taking Depakote  ER 500 mg 2 every night  She has some memory complaints, was put on Namenda  10 mg twice a day tolerating it well  Update August 26, 2020 SS: Doing well on lower dose Depakote  ER 500 mg at bedtime, no seizures. MMSE 26/30, doing well on Namenda  10 mg twice daily. Living with husband. Driving car, doing everything for herself. For memory, sometimes can't find the words. In hospital in Feb, had COVID, asthma flare. Depakote  level was 34 in Feb, creatinine 1.56. No complaints today.   Update Sep 01, 2021 SS: MMSE 27/30. Rough year, since Dec 2022 in hospital 8 times for asthma. Husband diagnosed with stomach cancer, had recent stroke, he is at home. She just got discharged 1 week ago for asthma. Driving, feels  memory is doing good, feels mentally exhausted. No seizures, only the 1 in 2017, remains on Depakote  ER 500 mg at bedtime, on Namenda  10 mg twice daily (since 2021).   Update August 08, 2022 SS: MMSE 24/30, has had rough year, her  husband is not doing well with stomach cancer. No major changes in memory. Still very active taking care of herself and family. No seizures, still on Depakote  ER 500 mg at bedtime. On Namenda  10 mg BID.   Update Sep 04, 2023 SS: Here with her daughter in law, April. Her husband passed few days ago. Her granddaughter lives with her. No seizures, remains on Depakote  ER 500 mg at bedtime. MMSE 23/30. No major changes with memory, occasional forgetfulness. Misplace things, otherwise good clarity, driving okay, managing household. No car accidents, or getting lost. Remains on Namenda . Had NSTEMI April 2024, with stent placement.   REVIEW OF SYSTEMS: Out of a complete 14 system review of symptoms, the patient complains only of the following symptoms, and all other reviewed systems are negative.  See HPI  ALLERGIES: Allergies  Allergen Reactions   Tramadol  Other (See Comments)    Not to be taken because of existing seizure disorder/SEIZURES    Chlorthalidone  Other (See Comments)    Was stopped due to kidney function    HOME MEDICATIONS: Outpatient Medications Prior to Visit  Medication Sig Dispense Refill   albuterol  (PROAIR  HFA) 108 (90 Base) MCG/ACT inhaler Inhale 2 puffs into the lungs every 4 (four) hours as needed for wheezing or shortness of breath. 18 g 2   amLODipine  (NORVASC ) 5 MG tablet Take 1 tablet (5 mg total) by mouth daily. 90 tablet 3   aspirin  81 MG chewable tablet Chew 1 tablet (81 mg total) by mouth daily. 90 tablet 2   atorvastatin  (LIPITOR ) 80 MG tablet Take 1 tablet (80 mg total) by mouth daily. 90 tablet 2   Biotin  5000 MCG CAPS Take 5,000 mcg by mouth daily.     carvedilol  (COREG ) 25 MG tablet Take 1 tablet (25 mg total) by mouth 2 (two) times daily. 180 tablet 3   clopidogrel  (PLAVIX ) 75 MG tablet Take 1 tablet (75 mg total) by mouth daily. 90 tablet 2   divalproex  (DEPAKOTE  ER) 500 MG 24 hr tablet TAKE 1 TABLET BY MOUTH AT  BEDTIME 90 tablet 3   empagliflozin   ([name] ) 10 MG TABS tablet Take 1 tablet (10 mg total) by mouth daily. 14 tablet    ferrous sulfate  325 (65 FE) MG tablet Take 325 mg by mouth daily with breakfast.     isosorbide  mononitrate (IMDUR ) 30 MG 24 hr tablet Take 1 tablet (30 mg total) by mouth daily. 90 tablet 3   latanoprost  (XALATAN ) 0.005 % ophthalmic solution Place 1 drop into both eyes at bedtime.     LORazepam  (ATIVAN ) 0.5 MG tablet Take 0.5 mg by mouth at bedtime.     losartan  (COZAAR ) 25 MG tablet Take 1 tablet (25 mg total) by mouth daily. 90 tablet 3   magnesium  oxide (MAG-OX) 400 (240 Mg) MG tablet Take 1 tablet (400 mg total) by mouth daily. 60 tablet 0   memantine  (NAMENDA ) 10 MG tablet TAKE 1 TABLET BY MOUTH TWICE  DAILY 180 tablet 3   metFORMIN  (GLUCOPHAGE ) 1000 MG tablet Take 1,000 mg by mouth 2 (two) times daily with a meal.     montelukast  (SINGULAIR ) 10 MG tablet TAKE 1 TABLET BY MOUTH AT  BEDTIME 90 tablet 3   Multiple Vitamins-Minerals (  ONE-A-DAY WOMENS PO) Take 1 tablet by mouth daily with breakfast.     nitroGLYCERIN  (NITROSTAT ) 0.4 MG SL tablet Place 1 tablet (0.4 mg total) under the tongue every 5 (five) minutes as needed for chest pain. Don't exceed more than 3 doses in 15 mins 30 tablet 0   pantoprazole  (PROTONIX ) 40 MG tablet Take 1 tablet (40 mg total) by mouth 2 (two) times daily before a meal. (Patient taking differently: Take 40 mg by mouth daily.) 60 tablet 0   Probiotic Product (EQ PROBIOTIC) CAPS Take 1 capsule by mouth daily.     tiZANidine  (ZANAFLEX ) 2 MG tablet Take 2 mg by mouth every 8 (eight) hours as needed.     No facility-administered medications prior to visit.    PAST MEDICAL HISTORY: Past Medical History:  Diagnosis Date   Anxiety    Arthritis    Asthma    Chronic diastolic CHF (congestive heart failure) (HCC) 07/17/2016   Coronary artery disease    a. NSTEMI 12/2015 - LHC 01/18/16: s/p overlapping DESx2 to RCA, 10% ost-prox Cx,10% mLAD.   Dementia (HCC)    Depression     Diabetes mellitus    Dyslipidemia    GERD (gastroesophageal reflux disease)    History of seizure    Hyperlipidemia    Hypertension    Hypertensive heart disease    NSTEMI (non-ST elevated myocardial infarction) (HCC) 12/2015   Stage 3 chronic kidney disease (HCC)     PAST SURGICAL HISTORY: Past Surgical History:  Procedure Laterality Date   ABDOMINAL HYSTERECTOMY     CARDIAC CATHETERIZATION  1994   minimal LAD dz, no other dz, EF normal   CARDIAC CATHETERIZATION N/A 01/18/2016   Procedure: Left Heart Cath and Coronary Angiography;  Surgeon: Odie Benne, MD;  Location[facility] INVASIVE CV LAB;  Service: Cardiovascular;  Laterality: N/A;   CARDIAC CATHETERIZATION N/A 01/18/2016   Procedure: Coronary Stent Intervention;  Surgeon: Odie Benne, MD;  Location[facility] INVAS[facility] CV LAB;  Service: Cardiovascular;  Laterality: N/A;   COLONOSCOPY WITH PROPOFOL  N/A 04/09/2014   Procedure: COLONOSCOPY WITH PROPOFOL ;  Surgeon: Brice Campi, MD;  Location: [facility] ENDOSCOPY;  Service: Endoscopy;  Laterality: N/A;   CORONARY LITHOTRIPSY N/A 08/10/2022   Procedure: CORONARY LITHOTRIPSY;  Surgeon: Kyra Phy, MD;  Location: [facility] INVASIVE CV LAB;  Service: Cardiovascular;  Laterality: N/A;   CORONARY STENT INTERVENTION N/A 08/10/2022   Procedure: CORONARY STENT INTERVENTION;  Surgeon: Kyra Phy, MD;  Location: [facility] INVASIVE CV LAB;  Service: Cardiovascular;  Laterality: N/A;   CORONARY STENT PLACEMENT  01/19/2016   STENT SYNERGY DES [id number] drug eluting stent was successfully placed, and overlaps the 2.5 x 38 mm Synergy stent placed distally.   CORONARY/GRAFT ACUTE MI REVASCULARIZATION N/A 08/10/2022   Procedure: Coronary/Graft Acute MI Revascularization;  Surgeon: Kyra Phy, MD;  Location: [facility] INVASIVE CV LAB;  Service: Cardiovascular;  Laterality: N/A;   ESOPHAGOGASTRODUODENOSCOPY (EGD) WITH PROPOFOL  N/A 04/09/2014   Procedure: ESOPHAGOGASTRODUODENOSCOPY (EGD) WITH PROPOFOL ;   Surgeon: Brice Campi, MD;  Location: [facility] ENDOSCOPY;  Service: Endoscopy;  Laterality: N/A;   FOOT SURGERY Bilateral    approx ~2000   HEMORRHOID SURGERY     HERNIA REPAIR     KNEE ARTHROSCOPY     LEFT HEART CATH AND CORONARY ANGIOGRAPHY N/A 08/10/2022   Procedure: LEFT HEART CATH AND CORONARY ANGIOGRAPHY;  Surgeon: Kyra Phy, MD;  Location: [facility] INVASIVE CV LAB;  Service: Cardiovascular;  Laterality: N/A;   RADIOACTIVE SEED  GUIDED EXCISIONAL BREAST BIOPSY Left 01/20/2021   Procedure: RADIOACTIVE SEED GUIDED EXCISIONAL LEFT BREAST BIOPSY;  Surgeon: Lockie Rima, MD;  Location: [facility] OR;  Service: General;  Laterality: Left;    FAMILY HISTORY: Family History  Problem Relation Age of Onset   Allergies Mother    Asthma Mother    CAD Father 67   Diabetes Father    Hypertension Father    Congestive Heart Failure Sister        died in her 27s   CAD Sister 24    SOCIAL HISTORY: Social History   Socioeconomic History   Marital status: Married    Spouse name: Not on file   Number of children: 3   Years of education: some college   Highest education level: Some college, no degree  Occupational History   Occupation: Occupational psychologist  Tobacco Use   Smoking status: Former    Current packs/day: 0.00    Average packs/day: 1 pack/day for 30.0 years (30.0 ttl pk-yrs)    Types: Cigarettes    Start date: 01/08/1974    Quit date: 01/09/2004    Years since quitting: 19.6   Smokeless tobacco: Never  Vaping Use   Vaping status: Never Used  Substance and Sexual Activity   Alcohol use: No    Alcohol/week: 0.0 standard drinks of alcohol   Drug use: No   Sexual activity: Not on file  Other Topics Concern   Not on file  Social History Narrative   Pt lives with husband in [place].   Right-handed.   2 cups caffeine per day.   Social Drivers of Corporate investment banker Strain: Not on file  Food Insecurity: No Food Insecurity (08/12/2022)   Hunger Vital Sign    Worried About  Running Out of Food in the Last Year: Never true    Ran Out of Food in the Last Year: Never true  Transportation Needs: No Transportation Needs (08/12/2022)   PRAPARE - Administrator, Civil Service (Medical): No    Lack of Transportation (Non-Medical): No  Physical Activity: Not on file  Stress: Not on file  Social Connections: Not on file  Intimate Partner Violence: Not At Risk (08/12/2022)   Humiliation, Afraid, Rape, and Kick questionnaire    Fear of Current or Ex-Partner: No    Emotionally Abused: No    Physically Abused: No    Sexually Abused: No   PHYSICAL EXAM  Vitals:   09/04/23 1501 09/04/23 1509  BP: (!) 145/81 (!) 146/83  Weight: 135 lb 8 oz (61.5 kg)   Height: 5\' 2"  (1.575 m)    Body mass index is 24.78 kg/m.  Generalized: Well developed, in no acute distress     09/04/2023    3:03 PM 08/08/2022    3:53 PM 09/01/2021   11:14 AM  MMSE - Mini Mental State Exam  Orientation to time 5 5 5   Orientation to Place 5 5 5   Registration 3 3 3   Attention/ Calculation 5 2 3   Recall 2 1 2   Language- name 2 objects 2 2 2   Language- repeat 1 1 1   Language- follow 3 step command 2 2 3   Language- read & follow direction 1 1 1   Write a sentence 1 1 1   Copy design 1 1 1   Total score 28 24 27     NEUROLOGICAL EXAM:  MENTAL STATUS: Speech/Cognition: Awake, alert, normal speech, oriented to history taking and casual conversation.  CRANIAL NERVES: CN II: Visual fields  are full to confrontation.  Pupils are round equal and briskly reactive to light. CN III, IV, VI: extraocular movement are normal. No ptosis. CN V: Facial sensation is intact to light touch. CN VII: Face is symmetric with normal eye closure and smile. CN VIII: Hearing is normal to casual conversation CN XI: Head turning and shoulder shrug are intact  MOTOR: Good strength all extremities  REFLEXES: Reflexes are 2+ throughout  SENSORY: Intact to light touch to all  extremities  COORDINATION: Finger-nose-finger and heel-to-shin is normal bilaterally  GAIT/STANCE: Gait is normal, independent  DIAGNOSTIC DATA (LABS, IMAGING, TESTING) - I reviewed patient records, labs, notes, testing and imaging myself where available.  Lab Results  Component Value Date   WBC 5.6 09/23/2022   HGB 11.9 (L) 09/23/2022   HCT 37.0 09/23/2022   MCV 100.8 (H) 09/23/2022   PLT 302 09/23/2022      Component Value Date/Time   NA 135 09/23/2022 1857   NA 145 (H) 08/22/2022 1450   K 3.7 09/23/2022 1857   CL 104 09/23/2022 1857   CO2 22 09/23/2022 1857   GLUCOSE 101 (H) 09/23/2022 1857   BUN 19 09/23/2022 1857   BUN 26 08/22/2022 1450   CREATININE 1.19 (H) 09/23/2022 1857   CREATININE 1.06 (H) 01/26/2016 1608   CALCIUM  8.9 09/23/2022 1857   PROT 7.3 10/04/2021 1520   PROT 7.6 05/17/2017 1141   ALBUMIN  4.2 10/04/2021 1520   ALBUMIN  4.3 05/17/2017 1141   AST 18 10/04/2021 1520   ALT 17 10/04/2021 1520   ALKPHOS 62 10/04/2021 1520   BILITOT 0.6 10/04/2021 1520   BILITOT 0.4 05/17/2017 1141   GFRNONAA 48 (L) 09/23/2022 1857   GFRAA 50 (L) 02/26/2020 1350   Lab Results  Component Value Date   CHOL 135 08/12/2022   HDL 50 08/12/2022   LDLCALC 47 08/12/2022   TRIG 188 (H) 08/12/2022   CHOLHDL 2.7 08/12/2022   Lab Results  Component Value Date   HGBA1C 6.3 (H) 08/10/2022   Lab Results  Component Value Date   VITAMINB12 766 05/14/2021   Lab Results  Component Value Date   TSH 0.622 05/13/2021   ASSESSMENT AND PLAN 73 y.o. year old female   1. Seizure, none since 2017 -No recent seizures, continue Depakote  ER 500 mg at bedtime -First generalized seizure was in 2011, multiple recurrent seizure-like spells since 2017-01-15following sudden death of family member, was under a lot of stress at that time -Check routine labs today     2.  Mild cognitive impairment -Overall stable, her husband passed a few days ago, MMSE was 23/30, (24/30),  (27/30) -Continue Namenda  10 mg twice a day -Laboratory evaluation showed no treatable etiology -MRI of the brain showed moderate supratentorium small vessel disease -Follow-up 1 year or sooner if needed, continue exercise, brain stimulating activity  Jeanmarie Millet, Maritza Sidles, DNP  [facility] [address], [address] [place], [place] [address] [phone]

## 2023-09-05 ENCOUNTER — Encounter: Payer: Self-pay | Admitting: Neurology

## 2023-09-05 LAB — CBC WITH DIFFERENTIAL/PLATELET
Basophils Absolute: 0.1 10*3/uL (ref 0.0–0.2)
Basos: 2 %
EOS (ABSOLUTE): 0.1 10*3/uL (ref 0.0–0.4)
Eos: 1 %
Hematocrit: 37.9 % (ref 34.0–46.6)
Hemoglobin: 12.5 g/dL (ref 11.1–15.9)
Immature Grans (Abs): 0 10*3/uL (ref 0.0–0.1)
Immature Granulocytes: 0 %
Lymphocytes Absolute: 2.1 10*3/uL (ref 0.7–3.1)
Lymphs: 34 %
MCH: 31.6 pg (ref 26.6–33.0)
MCHC: 33 g/dL (ref 31.5–35.7)
MCV: 96 fL (ref 79–97)
Monocytes Absolute: 0.6 10*3/uL (ref 0.1–0.9)
Monocytes: 9 %
Neutrophils Absolute: 3.3 10*3/uL (ref 1.4–7.0)
Neutrophils: 54 %
Platelets: 236 10*3/uL (ref 150–450)
RBC: 3.96 x10E6/uL (ref 3.77–5.28)
RDW: 12.7 % (ref 11.7–15.4)
WBC: 6.1 10*3/uL (ref 3.4–10.8)

## 2023-09-05 LAB — COMPREHENSIVE METABOLIC PANEL WITH GFR
ALT: 14 IU/L (ref 0–32)
AST: 17 IU/L (ref 0–40)
Albumin: 4.2 g/dL (ref 3.8–4.8)
Alkaline Phosphatase: 67 IU/L (ref 44–121)
BUN/Creatinine Ratio: 16 (ref 12–28)
BUN: 17 mg/dL (ref 8–27)
Bilirubin Total: 0.2 mg/dL (ref 0.0–1.2)
CO2: 23 mmol/L (ref 20–29)
Calcium: 9.5 mg/dL (ref 8.7–10.3)
Chloride: 102 mmol/L (ref 96–106)
Creatinine, Ser: 1.08 mg/dL — ABNORMAL HIGH (ref 0.57–1.00)
Globulin, Total: 2.3 g/dL (ref 1.5–4.5)
Glucose: 102 mg/dL — ABNORMAL HIGH (ref 70–99)
Potassium: 4.5 mmol/L (ref 3.5–5.2)
Sodium: 141 mmol/L (ref 134–144)
Total Protein: 6.5 g/dL (ref 6.0–8.5)
eGFR: 54 mL/min/{1.73_m2} — ABNORMAL LOW (ref >59)

## 2023-09-05 LAB — VALPROIC ACID LEVEL: Valproic Acid Lvl: 41 ug/mL — ABNORMAL LOW (ref 50–100)

## 2023-09-20 DIAGNOSIS — Z1231 Encounter for screening mammogram for malignant neoplasm of breast: Secondary | ICD-10-CM | POA: Diagnosis not present

## 2023-10-11 DIAGNOSIS — I739 Peripheral vascular disease, unspecified: Secondary | ICD-10-CM | POA: Diagnosis not present

## 2023-10-11 DIAGNOSIS — M2012 Hallux valgus (acquired), left foot: Secondary | ICD-10-CM | POA: Diagnosis not present

## 2023-10-11 DIAGNOSIS — E0840 Diabetes mellitus due to underlying condition with diabetic neuropathy, unspecified: Secondary | ICD-10-CM | POA: Diagnosis not present

## 2023-10-11 DIAGNOSIS — M79675 Pain in left toe(s): Secondary | ICD-10-CM | POA: Diagnosis not present

## 2023-10-11 DIAGNOSIS — B351 Tinea unguium: Secondary | ICD-10-CM | POA: Diagnosis not present

## 2023-10-27 ENCOUNTER — Other Ambulatory Visit: Payer: Self-pay | Admitting: Cardiology

## 2023-10-29 ENCOUNTER — Other Ambulatory Visit: Payer: Self-pay

## 2023-10-29 MED ORDER — ATORVASTATIN CALCIUM 80 MG PO TABS: 80.0000 mg | ORAL_TABLET | Freq: Every day | ORAL | 1 refills | Status: DC

## 2023-10-29 NOTE — Telephone Encounter (Signed)
 Rx refill sent to pharmacy.

## 2023-10-30 NOTE — Progress Notes (Unsigned)
 Cardiology Office Note    Date:  11/01/2023  ID:  Jacqueline, [name] 02-15-1951, MRN [id number] PCP:  Jacqueline Elsie SAUNDERS, MD  Cardiologist:  Jacqueline Parchment, MD  Electrophysiologist:  None   Chief Complaint: Follow up for CAD   History of Present Illness: .    [name] is a 73 y.o. female with visit-pertinent history of coronary artery disease, type 2 diabetes, hypertension, hyperlipidemia, diastolic heart failure, asthma, GERD, CKD stage III and seizures.  Patient has history of myocardial infarction in 2017 and inferior STEMI in April 2024.  Cardiac catheterization revealed high-grade proximal and ostial right coronary artery disease treated with shockwave lithotripsy and 1 DES, high-grade distal right coronary artery disease treated with DES with plaque shift into a jailed PDA branch.  She was recommended for dual antiplatelet for the rib at least 1 year.  Patient was seen by Dr. Parchment on 12/26/2022 and was found to be hypertensive and reported a lot of stress due to her husband's terminal cancer diagnosis.  No medication changes were made at that time.  Patient was last seen in clinic on 03/20/2023 by Jacqueline Satterfield, NP regarding increased bruising.  Patient was unaware that Plavix  could result in increased bruising.  Today she presents for follow up, she reports that she has been doing well. Reports her husband of 51 years passed away two months and last week a very close friend passed away.  She has been struggling with these changes.  Patient reports that she has relied heavily on her family in her time of grieving.  She reports that she has been doing very well medically, denies any chest pain, shortness of breath, orthopnea or PND.  She notes some minor intermittent lower extremity edema when she eats too much salt, resolves with elevation.  Patient reports that prior to her husband passing away few months ago she was regularly exercising in the gym, intends to restart.  She denies  any cardiac concerns or complaints today.  Labwork independently reviewed: 09/04/2023: Sodium 141, potassium 4.5, creatinine 1.08, AST 17, ALT 14 ROS: .   Today she denies chest pain, shortness of breath, fatigue, palpitations, melena, hematuria, hemoptysis, diaphoresis, weakness, presyncope, syncope, orthopnea, and PND.  All other systems are reviewed and otherwise negative. Studies Reviewed: [name]   EKG:  EKG is ordered today, personally reviewed, demonstrating  EKG Interpretation Date/Time:  Thursday November 01 2023 13:18:33 EDT Ventricular Rate:  72 PR Interval:  152 QRS Duration:  82 QT Interval:  374 QTC Calculation: 409 R Axis:   9  Text Interpretation: Normal sinus rhythm Normal ECG When compared with ECG of 26-Dec-2022 09:41, No significant change was found Confirmed by [name] [phone]) on 11/01/2023 1:23:07 PM   CV Studies: Cardiac studies reviewed are outlined and summarized above. Otherwise please see EMR for full report. Cardiac Studies & Procedures   ______________________________________________________________________________________________ CARDIAC CATHETERIZATION  CARDIAC CATHETERIZATION 08/10/2022  Conclusion   Prox RCA to Dist RCA lesion is 10% stenosed.   Prox RCA lesion is 75% stenosed.   RPAV lesion is 99% stenosed.   A stent was successfully placed.   A stent was successfully placed.   A stent was successfully placed.   Post intervention, there is a 0% residual stenosis.   Post intervention, there is a 0% residual stenosis.   Post intervention, there is a 0% residual stenosis.  1.  High-grade proximal and ostial right coronary artery disease treated with shockwave lithotripsy and 1 drug-eluting stent. 2.  High-grade distal right coronary artery disease treated with 1 drug-eluting stent with plaque shift into the jailed PDA branch.  This was complicated by a small distal wire perforation.  The patient was not hypotensive and a stat echocardiogram obtained in the  cardiac catheterization laboratory demonstrated no significant pericardial effusion. 3.  Patent proximal and mid right coronary artery stents. 4.  Mild disease of the LAD. 5.  LVEDP of 14 mmHg.  Recommendation: Dual antiplatelet therapy for at least 1 year.  Echocardiogram will be done later today to assess for pericardial effusion.  The case was reviewed with Dr. Wadie the team C attending.  Findings Coronary Findings Diagnostic  Dominance: Right  Left Anterior Descending There is mild diffuse disease throughout the vessel.  Right Coronary Artery Prox RCA lesion is 75% stenosed. Prox RCA to Dist RCA lesion is 10% stenosed. The lesion was previously treated .  Right Posterior Atrioventricular Artery RPAV lesion is 99% stenosed.  Intervention  Prox RCA lesion Stent A stent was successfully placed. Post-Intervention Lesion Assessment The intervention was successful. Pre-interventional TIMI flow is 3. Post-intervention TIMI flow is 3. There is a 0% residual stenosis post intervention.  Prox RCA to Dist RCA lesion Stent A stent was successfully placed. Post-Intervention Lesion Assessment The intervention was successful. Pre-interventional TIMI flow is 3. Post-intervention TIMI flow is 3. There is a 0% residual stenosis post intervention.  RPAV lesion Stent A stent was successfully placed. Post-Intervention Lesion Assessment The intervention was successful. Pre-interventional TIMI flow is 3. Post-intervention TIMI flow is 3. There is a 0% residual stenosis post intervention.   CARDIAC CATHETERIZATION  CARDIAC CATHETERIZATION 01/18/2016  Conclusion  Mid RCA lesion, 70 %stenosed.  Dist RCA lesion, 90 %stenosed.  A STENT SYNERGY DES [id number] drug eluting stent was successfully placed, and overlaps the 2.5 x 38 mm Synergy stent placed distally.  Prox RCA lesion, 80 %stenosed.  Post intervention, there is a 0% residual stenosis.  Ost Cx to Prox Cx lesion, 10  %stenosed.  Mid LAD lesion, 10 %stenosed.  1. NSTEMI 2. Severe single vessel CAD with severe stenosis in the mid and distal RCA 3. Successful PTCA/DES x 2 mid and distal RCA  Recommendations: Dual anti-platelet therapy with ASA and Brilinta  for one year. Continue statin.  Findings Coronary Findings Diagnostic  Dominance: Right  Left Anterior Descending  First Diagonal Branch Vessel is moderate in size.  Second Diagonal Branch Vessel is small in size.  Third Diagonal Branch Vessel is small in size.  Left Circumflex Vessel is moderate in size.  First Obtuse Marginal Branch Vessel is moderate in size.  Second Obtuse Marginal Branch Vessel is small in size.  Right Coronary Artery  Intervention  Prox RCA lesion Angioplasty (Also treats lesions: Mid RCA, and Dist RCA) Lesion crossed with guidewire. Pre-stent angioplasty was performed using a BALLOON EMERGE MR 2.0X12. A STENT SYNERGY DES [id number] drug eluting stent was successfully placed, and overlaps previously placed stent. Stent strut is well apposed. Post-stent angioplasty was performed using a BALLOON Zemple EMERGE MR 2.75X30. The pre-interventional distal flow is normal (TIMI 3).  The post-interventional distal flow is normal (TIMI 3). The intervention was successful . No complications occurred at this lesion. There is a 0% residual stenosis post intervention.  Mid RCA lesion Angioplasty (Also treats lesions: Prox RCA, and Dist RCA) See details in Prox RCA lesion. There is a 0% residual stenosis post intervention.  Dist RCA lesion Angioplasty (Also treats lesions: Prox RCA, and Mid RCA) See details in Prox  RCA lesion. There is a 0% residual stenosis post intervention.   STRESS TESTS  MYOCARDIAL PERFUSION IMAGING 02/06/2022  Narrative   The study is normal. The study is low risk.   No ST deviation was noted.   LV perfusion is normal.   Left ventricular function is normal. Nuclear stress EF: 71 %. The left  ventricular ejection fraction is hyperdynamic (>65%). End diastolic cavity size is normal.   Prior study available for comparison from 05/13/2020. No significant change from prior study.   ECHOCARDIOGRAM  ECHOCARDIOGRAM COMPLETE 09/19/2022  Narrative ECHOCARDIOGRAM REPORT    Patient Name:   KENETRA HILDENBRAND Date of Exam: 09/19/2022 Medical Rec #:  [id number]      Height:       62.5 in Accession #:    [id number]     Weight:       147.7 lb Date of Birth:  01-02-1951       BSA:          1.691 m Patient Age:    71 years       BP:           130/86 mmHg Patient Gender: F              HR:           74 bpm. Exam Location:  [address]  Procedure: 2D Echo, 3D Echo, Cardiac Doppler and Color Doppler  Indications:    I31.3 Pericardial Effusion  History:        Patient has prior history of Echocardiogram examinations, most recent 08/11/2022. CHF, CAD and Previous Myocardial Infarction; Risk Factors:Family History of Coronary Artery Disease, Hypertension, Diabetes, Dyslipidemia and Former Smoker. Pericardial Effusion (status post Cath wire Perforation, 08-10-22), Pericarditis, Chronic Kidney Disease, NSTEMI.  Sonographer:    Heather Hawks RDCS Referring Phys: JACKEE VEAR DICK  IMPRESSIONS   1. Pericardium: Trivial pericardial effusion is present. The pericardial effusion is anterior to the right ventricle. 2. Left ventricular ejection fraction, by estimation, is 60 to 65%. Left ventricular ejection fraction by 3D volume is 65 %. The left ventricle has normal function. The left ventricle has no regional wall motion abnormalities. There is mild left ventricular hypertrophy. Left ventricular diastolic parameters are consistent with Grade I diastolic dysfunction (impaired relaxation). 3. Right ventricular systolic function is normal. The right ventricular size is normal. 4. There is no evidence of cardiac tamponade. 5. The mitral valve is normal in structure. Mild mitral valve regurgitation. No  evidence of mitral stenosis. 6. The aortic valve is tricuspid. Aortic valve regurgitation is not visualized. No aortic stenosis is present. 7. The inferior vena cava is normal in size with greater than 50% respiratory variability, suggesting right atrial pressure of 3 mmHg.  Comparison(s): Prior images reviewed side by side.  FINDINGS Left Ventricle: Left ventricular ejection fraction, by estimation, is 60 to 65%. Left ventricular ejection fraction by 3D volume is 65 %. The left ventricle has normal function. The left ventricle has no regional wall motion abnormalities. The left ventricular internal cavity size was normal in size. There is mild left ventricular hypertrophy. Left ventricular diastolic parameters are consistent with Grade I diastolic dysfunction (impaired relaxation).  Right Ventricle: The right ventricular size is normal. No increase in right ventricular wall thickness. Right ventricular systolic function is normal.  Left Atrium: Left atrial size was normal in size.  Right Atrium: Right atrial size was normal in size.  Pericardium: Trivial pericardial effusion is present. The pericardial effusion is anterior to  the right ventricle. There is no evidence of cardiac tamponade.  Mitral Valve: The mitral valve is normal in structure. Mild mitral valve regurgitation. No evidence of mitral valve stenosis.  Tricuspid Valve: The tricuspid valve is normal in structure. Tricuspid valve regurgitation is not demonstrated. No evidence of tricuspid stenosis.  Aortic Valve: The aortic valve is tricuspid. Aortic valve regurgitation is not visualized. No aortic stenosis is present.  Pulmonic Valve: The pulmonic valve was normal in structure. Pulmonic valve regurgitation is not visualized. No evidence of pulmonic stenosis.  Aorta: The aortic root is normal in size and structure.  Venous: The inferior vena cava is normal in size with greater than 50% respiratory variability, suggesting right  atrial pressure of 3 mmHg.  IAS/Shunts: No atrial level shunt detected by color flow Doppler.   LEFT VENTRICLE PLAX 2D LVIDd:         2.90 cm         Diastology LVIDs:         1.50 cm         LV e' medial:    4.23 cm/s LV PW:         1.20 cm         LV E/e' medial:  14.5 LV IVS:        1.10 cm         LV e' lateral:   7.21 cm/s LVOT diam:     2.00 cm         LV E/e' lateral: 8.5 LV SV:         47 LV SV Index:   28 LVOT Area:     3.14 cm        3D Volume EF LV 3D EF:    Left ventricul ar ejection fraction by 3D volume is 65 %.  3D Volume EF: 3D EF:        65 % LV EDV:       93 ml LV ESV:       32 ml LV SV:        60 ml  RIGHT VENTRICLE RV Basal diam:  2.90 cm RV S prime:     12.90 cm/s TAPSE (M-mode): 1.6 cm  LEFT ATRIUM             Index        RIGHT ATRIUM           Index LA diam:        2.90 cm 1.72 cm/m   RA Pressure: 3.00 mmHg LA Vol (A2C):   44.3 ml 26.20 ml/m  RA Area:     8.27 cm LA Vol (A4C):   31.6 ml 18.69 ml/m  RA Volume:   16.20 ml  9.58 ml/m LA Biplane Vol: 38.3 ml 22.65 ml/m AORTIC VALVE LVOT Vmax:   80.95 cm/s LVOT Vmean:  52.900 cm/s LVOT VTI:    0.150 m  AORTA Ao Root diam: 2.80 cm Ao Asc diam:  3.20 cm  MITRAL VALVE               TRICUSPID VALVE MV Area (PHT): cm         Estimated RAP:  3.00 mmHg MV Decel Time: 195 msec MV E velocity: 61.45 cm/s  SHUNTS MV A velocity: 92.62 cm/s  Systemic VTI:  0.15 m MV E/A ratio:  0.66        Systemic Diam: 2.00 cm  Jacqueline Parchment MD Electronically signed by Jacqueline Parchment MD Signature Date/Time:  09/19/2022/4:23:12 PM    Final          ______________________________________________________________________________________________       Current Reported Medications:.    Current Meds  Medication Sig   albuterol  (PROAIR  HFA) 108 (90 Base) MCG/ACT inhaler Inhale 2 puffs into the lungs every 4 (four) hours as needed for wheezing or shortness of breath.   amLODipine  (NORVASC ) 10 MG tablet Take  1 tablet (10 mg total) by mouth daily.   aspirin  81 MG chewable tablet Chew 1 tablet (81 mg total) by mouth daily.   atorvastatin  (LIPITOR ) 80 MG tablet Take 1 tablet (80 mg total) by mouth daily.   Biotin  5000 MCG CAPS Take 5,000 mcg by mouth daily.   carvedilol  (COREG ) 25 MG tablet Take 1 tablet (25 mg total) by mouth 2 (two) times daily.   divalproex  (DEPAKOTE  ER) 500 MG 24 hr tablet Take 1 tablet (500 mg total) by mouth at bedtime.   empagliflozin  ([name] ) 10 MG TABS tablet Take 1 tablet by mouth once daily   ferrous sulfate  325 (65 FE) MG tablet Take 325 mg by mouth daily with breakfast.   isosorbide  mononitrate (IMDUR ) 30 MG 24 hr tablet Take 1 tablet (30 mg total) by mouth daily.   latanoprost  (XALATAN ) 0.005 % ophthalmic solution Place 1 drop into both eyes at bedtime.   LORazepam  (ATIVAN ) 0.5 MG tablet Take 0.5 mg by mouth at bedtime.   losartan  (COZAAR ) 25 MG tablet Take 1 tablet (25 mg total) by mouth daily.   magnesium  oxide (MAG-OX) 400 (240 Mg) MG tablet Take 1 tablet (400 mg total) by mouth daily.   memantine  (NAMENDA ) 10 MG tablet Take 1 tablet (10 mg total) by mouth 2 (two) times daily.   metFORMIN  (GLUCOPHAGE ) 1000 MG tablet Take 1,000 mg by mouth 2 (two) times daily with a meal.   montelukast  (SINGULAIR ) 10 MG tablet TAKE 1 TABLET BY MOUTH AT  BEDTIME   Multiple Vitamins-Minerals (ONE-A-DAY WOMENS PO) Take 1 tablet by mouth daily with breakfast.   nitroGLYCERIN  (NITROSTAT ) 0.4 MG SL tablet Place 1 tablet (0.4 mg total) under the tongue every 5 (five) minutes as needed for chest pain. Don't exceed more than 3 doses in 15 mins   pantoprazole  (PROTONIX ) 40 MG tablet Take 1 tablet (40 mg total) by mouth 2 (two) times daily before a meal. (Patient taking differently: Take 40 mg by mouth daily.)   Probiotic Product (EQ PROBIOTIC) CAPS Take 1 capsule by mouth daily.   tiZANidine  (ZANAFLEX ) 2 MG tablet Take 2 mg by mouth every 8 (eight) hours as needed.   [DISCONTINUED] amLODipine   (NORVASC ) 5 MG tablet Take 1 tablet (5 mg total) by mouth daily.   Physical Exam:    VS:  BP (!) 144/78   Pulse 72   Ht 5' 2 (1.575 m)   Wt 137 lb 12.8 oz (62.5 kg)   LMP  (LMP Unknown)   SpO2 98%   BMI 25.20 kg/m    Wt Readings from Last 3 Encounters:  11/01/23 137 lb 12.8 oz (62.5 kg)  09/04/23 135 lb 8 oz (61.5 kg)  03/20/23 136 lb (61.7 kg)    GEN: Well nourished, well developed in no acute distress NECK: No JVD; No carotid bruits CARDIAC: RRR, no murmurs, rubs, gallops RESPIRATORY:  Clear to auscultation without rales, wheezing or rhonchi  ABDOMEN: Soft, non-tender, non-distended EXTREMITIES:  No edema; No acute deformity     Asessement and Plan:.    CAD: S/p proximal and mid RCA  stents in 2017.  Patient with inferior STEMI in April 2024, found to have high-grade proximal and ostial RCA disease treated with shockwave lithotripsy and 1 DES, high-grade distal RCA disease treated with 1 DES with plaque shift into the jailed PDA branch. Stable with no anginal symptoms. No indication for ischemic evaluation.  Heart healthy diet and regular cardiovascular exercise encouraged.  Continue amlodipine  5 mg daily, aspirin  81 mg daily, Lipitor  80 mg daily, carvedilol  25 mg twice daily, [name]  10 mg daily, Imdur  30 mg daily, losartan  25 mg daily.  Reviewed ED precautions.  Hypertension: Initial blood pressure today 155/73, on recheck was 144/78.  Will increase patient's amlodipine  to 10 mg daily.  Otherwise continue current antihypertensive regimen.  Hypercholesterolemia: Patient's most recent lipid profile 02/06/2023 indicated total cholesterol 183, HDL 51, triglycerides 259 and LDL 89.  Patient is unsure if she had been adherent with her Lipitor , notes that her diet recently has not been very heart healthy.  Discussed with patient and we will plan to recheck on follow-up.  May need to consider starting on ezetimibe.  Chronic diastolic HF: Echocardiogram on 09/19/2022 indicated LVEF 60 to  65%, no RWMA, mild LVH, G1 DD, RV systolic function and size was normal, no evidence of cardiac tamponade, no aortic stenosis. Today she reports doing well, denies any shortness of breath, reports intermittent lower extremity edema after eating a significant asphalt, resolves overnight.  Continue [name]  10 mg daily, carvedilol  25 mg twice daily and losartan  25 mg daily.   Disposition: F/u with Demarcus Thielke, NP in six weeks.   Signed, Licia Harl D Blanchie Zeleznik, NP

## 2023-11-01 ENCOUNTER — Ambulatory Visit: Attending: Cardiology | Admitting: Cardiology

## 2023-11-01 ENCOUNTER — Encounter: Payer: Self-pay | Admitting: Cardiology

## 2023-11-01 VITALS — BP 144/78 | HR 72 | Ht 62.0 in | Wt 137.8 lb

## 2023-11-01 DIAGNOSIS — I2510 Atherosclerotic heart disease of native coronary artery without angina pectoris: Secondary | ICD-10-CM

## 2023-11-01 DIAGNOSIS — I10 Essential (primary) hypertension: Secondary | ICD-10-CM

## 2023-11-01 DIAGNOSIS — I5032 Chronic diastolic (congestive) heart failure: Secondary | ICD-10-CM | POA: Diagnosis not present

## 2023-11-01 DIAGNOSIS — E7800 Pure hypercholesterolemia, unspecified: Secondary | ICD-10-CM

## 2023-11-01 MED ORDER — AMLODIPINE BESYLATE 10 MG PO TABS: 10.0000 mg | ORAL_TABLET | Freq: Every day | ORAL | 3 refills | Status: AC

## 2023-11-01 NOTE — Patient Instructions (Addendum)
 Medication Instructions:  Increase Amlodipine  10 mg once a day *If you need a refill on your cardiac medications before your next appointment, please call your pharmacy*  Lab Work: No Labs  Testing/Procedures: No testing  Follow-Up: At [facility], you and your health needs are our priority.  As part of our continuing mission to provide you with exceptional heart care, our providers are all part of one team.  This team includes your primary Cardiologist (physician) and Advanced Practice Providers or APPs (Physician Assistants and Nurse Practitioners) who all work together to provide you with the care you need, when you need it.  Your next appointment:   6-8 week(s)  Provider:   Katlyn West, NP Then, Oneil Parchment, MD will plan to see you again in 6 month(s).    We recommend signing up for the patient portal called MyChart.  Sign up information is provided on this After Visit Summary.  MyChart is used to connect with patients for Virtual Visits (Telemedicine).  Patients are able to view lab/test results, encounter notes, upcoming appointments, etc.  Non-urgent messages can be sent to your provider as well.   To learn more about what you can do with MyChart, go to [email or web address].   Other Instructions Please take your blood pressure daily for 2 weeks and send in a MyChart message. Please include heart rates. (One message at the end of the 2 weeks).   HOW TO TAKE YOUR BLOOD PRESSURE: Rest 5 minutes before taking your blood pressure. Don't smoke or drink caffeinated beverages for at least 30 minutes before. Take your blood pressure before (not after) you eat. Sit comfortably with your back supported and both feet on the floor (don't cross your legs). Elevate your arm to heart level on a table or a desk. Use the proper sized cuff. It should fit smoothly and snugly around your bare upper arm. There should be enough room to slip a fingertip under the cuff. The bottom edge of  the cuff should be 1 inch above the crease of the elbow. Ideally, take 3 measurements at one sitting and record the average.

## 2023-11-08 DIAGNOSIS — S335XXA Sprain of ligaments of lumbar spine, initial encounter: Secondary | ICD-10-CM | POA: Diagnosis not present

## 2023-11-08 DIAGNOSIS — K219 Gastro-esophageal reflux disease without esophagitis: Secondary | ICD-10-CM | POA: Diagnosis not present

## 2023-12-19 NOTE — Progress Notes (Unsigned)
 Cardiology Office Note    Date:  12/21/2023  ID:  [name], [name] 01-08-1951, MRN [id number] PCP:  Arloa Elsie SAUNDERS, MD  Cardiologist:  Oneil Parchment, MD  Electrophysiologist:  None   Chief Complaint: Follow up for CAD and HTN   History of Present Illness: .    Jacqueline HEIDEL is a 73 y.o. female with visit-pertinent history of coronary artery disease, type 2 diabetes, hypertension, hyperlipidemia, diastolic heart failure, asthma, GERD, CKD stage III and seizures.  Patient has history of myocardial infarction in 2017 and inferior STEMI in April 2024.  Cardiac catheterization revealed high-grade proximal and ostial right coronary artery disease treated with shockwave lithotripsy and 1 DES, high-grade distal right coronary artery disease treated with DES with plaque shift into a jailed PDA branch.  She was recommended for dual antiplatelet for the rib at least 1 year.  Patient was seen by Dr. Parchment on 12/26/2022 and was found to be hypertensive and reported a lot of stress due to her husband's terminal cancer diagnosis.  No medication changes were made at that time.  Patient was last seen in clinic on 03/20/2023 by Lamarr Satterfield, NP regarding increased bruising.  Patient was unaware that Plavix  could result in increased bruising.  Patient was seen in clinic on 11/01/2023 for follow-up.  She reported that she been doing well.  She noted that her husband to 51 years of passed sadly away a few months prior any very close friend had also passed away the week prior.  She noted she been struggling with these changes.  She noted some minor intermittent lower extremity edema when she eats too much salt, resolved with exertion.  Patient's blood pressure was elevated in office, her amlodipine  was increased to 10 mg daily.  Today she presents for follow-up.  She reports that she has been doing very well overall. She denies chest pain, shortness of breath, lower extremity edema, orthopnea or pnd. She  denies palpitations, presyncope or syncope. She reports that her blood pressure has been significantly better controlled. She denies any concerns or complaints today. She notes she exercises while she is shopping. Recently returned from a road trip with her sister to [place], [place] , [place] .   ROS: .   Today she denies chest pain, shortness of breath, lower extremity edema, fatigue, palpitations, melena, hematuria, hemoptysis, diaphoresis, weakness, presyncope, syncope, orthopnea, and PND.  All other systems are reviewed and otherwise negative. Studies Reviewed: SABRA   EKG:  EKG is not ordered today.  CV Studies: Cardiac studies reviewed are outlined and summarized above. Otherwise please see EMR for full report. Cardiac Studies & Procedures   ______________________________________________________________________________________________ CARDIAC CATHETERIZATION  CARDIAC CATHETERIZATION 08/10/2022  Conclusion   Prox RCA to Dist RCA lesion is 10% stenosed.   Prox RCA lesion is 75% stenosed.   RPAV lesion is 99% stenosed.   A stent was successfully placed.   A stent was successfully placed.   A stent was successfully placed.   Post intervention, there is a 0% residual stenosis.   Post intervention, there is a 0% residual stenosis.   Post intervention, there is a 0% residual stenosis.  1.  High-grade proximal and ostial right coronary artery disease treated with shockwave lithotripsy and 1 drug-eluting stent. 2.  High-grade distal right coronary artery disease treated with 1 drug-eluting stent with plaque shift into the jailed PDA branch.  This was complicated by a small distal wire perforation.  The patient was not hypotensive and a stat echocardiogram  obtained in the cardiac catheterization laboratory demonstrated no significant pericardial effusion. 3.  Patent proximal and mid right coronary artery stents. 4.  Mild disease of the LAD. 5.  LVEDP of 14 mmHg.  Recommendation: Dual antiplatelet  therapy for at least 1 year.  Echocardiogram will be done later today to assess for pericardial effusion.  The case was reviewed with Dr. Wadie the team C attending.  Findings Coronary Findings Diagnostic  Dominance: Right  Left Anterior Descending There is mild diffuse disease throughout the vessel.  Right Coronary Artery Prox RCA lesion is 75% stenosed. Prox RCA to Dist RCA lesion is 10% stenosed. The lesion was previously treated .  Right Posterior Atrioventricular Artery RPAV lesion is 99% stenosed.  Intervention  Prox RCA lesion Stent A stent was successfully placed. Post-Intervention Lesion Assessment The intervention was successful. Pre-interventional TIMI flow is 3. Post-intervention TIMI flow is 3. There is a 0% residual stenosis post intervention.  Prox RCA to Dist RCA lesion Stent A stent was successfully placed. Post-Intervention Lesion Assessment The intervention was successful. Pre-interventional TIMI flow is 3. Post-intervention TIMI flow is 3. There is a 0% residual stenosis post intervention.  RPAV lesion Stent A stent was successfully placed. Post-Intervention Lesion Assessment The intervention was successful. Pre-interventional TIMI flow is 3. Post-intervention TIMI flow is 3. There is a 0% residual stenosis post intervention.   CARDIAC CATHETERIZATION  CARDIAC CATHETERIZATION 01/18/2016  Conclusion  Mid RCA lesion, 70 %stenosed.  Dist RCA lesion, 90 %stenosed.  A STENT SYNERGY DES [id number] drug eluting stent was successfully placed, and overlaps the 2.5 x 38 mm Synergy stent placed distally.  Prox RCA lesion, 80 %stenosed.  Post intervention, there is a 0% residual stenosis.  Ost Cx to Prox Cx lesion, 10 %stenosed.  Mid LAD lesion, 10 %stenosed.  1. NSTEMI 2. Severe single vessel CAD with severe stenosis in the mid and distal RCA 3. Successful PTCA/DES x 2 mid and distal RCA  Recommendations: Dual anti-platelet therapy with ASA and  Brilinta  for one year. Continue statin.  Findings Coronary Findings Diagnostic  Dominance: Right  Left Anterior Descending  First Diagonal Branch Vessel is moderate in size.  Second Diagonal Branch Vessel is small in size.  Third Diagonal Branch Vessel is small in size.  Left Circumflex Vessel is moderate in size.  First Obtuse Marginal Branch Vessel is moderate in size.  Second Obtuse Marginal Branch Vessel is small in size.  Right Coronary Artery  Intervention  Prox RCA lesion Angioplasty (Also treats lesions: Mid RCA, and Dist RCA) Lesion crossed with guidewire. Pre-stent angioplasty was performed using a BALLOON EMERGE MR 2.0X12. A STENT SYNERGY DES [id number] drug eluting stent was successfully placed, and overlaps previously placed stent. Stent strut is well apposed. Post-stent angioplasty was performed using a BALLOON [place] EMERGE MR 2.75X30. The pre-interventional distal flow is normal (TIMI 3).  The post-interventional distal flow is normal (TIMI 3). The intervention was successful . No complications occurred at this lesion. There is a 0% residual stenosis post intervention.  Mid RCA lesion Angioplasty (Also treats lesions: Prox RCA, and Dist RCA) See details in Prox RCA lesion. There is a 0% residual stenosis post intervention.  Dist RCA lesion Angioplasty (Also treats lesions: Prox RCA, and Mid RCA) See details in Prox RCA lesion. There is a 0% residual stenosis post intervention.   STRESS TESTS  MYOCARDIAL PERFUSION IMAGING 02/06/2022  Interpretation Summary   The study is normal. The study is low risk.   No ST deviation was  noted.   LV perfusion is normal.   Left ventricular function is normal. Nuclear stress EF: 71 %. The left ventricular ejection fraction is hyperdynamic (>65%). End diastolic cavity size is normal.   Prior study available for comparison from 05/13/2020. No significant change from prior study.   ECHOCARDIOGRAM  ECHOCARDIOGRAM COMPLETE  09/19/2022  Narrative ECHOCARDIOGRAM REPORT    Patient Name:   HALLI EQUIHUA Date of Exam: 09/19/2022 Medical Rec #:  [id number]      Height:       62.5 in Accession #:    [id number]     Weight:       147.7 lb Date of Birth:  14-Nov-1950       BSA:          1.691 m Patient Age:    71 years       BP:           130/86 mmHg Patient Gender: F              HR:           74 bpm. Exam Location:  [address]  Procedure: 2D Echo, 3D Echo, Cardiac Doppler and Color Doppler  Indications:    I31.3 Pericardial Effusion  History:        Patient has prior history of Echocardiogram examinations, most recent 08/11/2022. CHF, CAD and Previous Myocardial Infarction; Risk Factors:Family History of Coronary Artery Disease, Hypertension, Diabetes, Dyslipidemia and Former Smoker. Pericardial Effusion (status post Cath wire Perforation, 08-10-22), Pericarditis, Chronic Kidney Disease, NSTEMI.  Sonographer:    Heather Hawks RDCS Referring Phys: JACKEE VEAR DICK  IMPRESSIONS   1. Pericardium: Trivial pericardial effusion is present. The pericardial effusion is anterior to the right ventricle. 2. Left ventricular ejection fraction, by estimation, is 60 to 65%. Left ventricular ejection fraction by 3D volume is 65 %. The left ventricle has normal function. The left ventricle has no regional wall motion abnormalities. There is mild left ventricular hypertrophy. Left ventricular diastolic parameters are consistent with Grade I diastolic dysfunction (impaired relaxation). 3. Right ventricular systolic function is normal. The right ventricular size is normal. 4. There is no evidence of cardiac tamponade. 5. The mitral valve is normal in structure. Mild mitral valve regurgitation. No evidence of mitral stenosis. 6. The aortic valve is tricuspid. Aortic valve regurgitation is not visualized. No aortic stenosis is present. 7. The inferior vena cava is normal in size with greater than 50% respiratory variability,  suggesting right atrial pressure of 3 mmHg.  Comparison(s): Prior images reviewed side by side.  FINDINGS Left Ventricle: Left ventricular ejection fraction, by estimation, is 60 to 65%. Left ventricular ejection fraction by 3D volume is 65 %. The left ventricle has normal function. The left ventricle has no regional wall motion abnormalities. The left ventricular internal cavity size was normal in size. There is mild left ventricular hypertrophy. Left ventricular diastolic parameters are consistent with Grade I diastolic dysfunction (impaired relaxation).  Right Ventricle: The right ventricular size is normal. No increase in right ventricular wall thickness. Right ventricular systolic function is normal.  Left Atrium: Left atrial size was normal in size.  Right Atrium: Right atrial size was normal in size.  Pericardium: Trivial pericardial effusion is present. The pericardial effusion is anterior to the right ventricle. There is no evidence of cardiac tamponade.  Mitral Valve: The mitral valve is normal in structure. Mild mitral valve regurgitation. No evidence of mitral valve stenosis.  Tricuspid Valve: The tricuspid valve is normal  in structure. Tricuspid valve regurgitation is not demonstrated. No evidence of tricuspid stenosis.  Aortic Valve: The aortic valve is tricuspid. Aortic valve regurgitation is not visualized. No aortic stenosis is present.  Pulmonic Valve: The pulmonic valve was normal in structure. Pulmonic valve regurgitation is not visualized. No evidence of pulmonic stenosis.  Aorta: The aortic root is normal in size and structure.  Venous: The inferior vena cava is normal in size with greater than 50% respiratory variability, suggesting right atrial pressure of 3 mmHg.  IAS/Shunts: No atrial level shunt detected by color flow Doppler.   LEFT VENTRICLE PLAX 2D LVIDd:         2.90 cm         Diastology LVIDs:         1.50 cm         LV e' medial:    4.23 cm/s LV  PW:         1.20 cm         LV E/e' medial:  14.5 LV IVS:        1.10 cm         LV e' lateral:   7.21 cm/s LVOT diam:     2.00 cm         LV E/e' lateral: 8.5 LV SV:         47 LV SV Index:   28 LVOT Area:     3.14 cm        3D Volume EF LV 3D EF:    Left ventricul ar ejection fraction by 3D volume is 65 %.  3D Volume EF: 3D EF:        65 % LV EDV:       93 ml LV ESV:       32 ml LV SV:        60 ml  RIGHT VENTRICLE RV Basal diam:  2.90 cm RV S prime:     12.90 cm/s TAPSE (M-mode): 1.6 cm  LEFT ATRIUM             Index        RIGHT ATRIUM           Index LA diam:        2.90 cm 1.72 cm/m   RA Pressure: 3.00 mmHg LA Vol (A2C):   44.3 ml 26.20 ml/m  RA Area:     8.27 cm LA Vol (A4C):   31.6 ml 18.69 ml/m  RA Volume:   16.20 ml  9.58 ml/m LA Biplane Vol: 38.3 ml 22.65 ml/m AORTIC VALVE LVOT Vmax:   80.95 cm/s LVOT Vmean:  52.900 cm/s LVOT VTI:    0.150 m  AORTA Ao Root diam: 2.80 cm Ao Asc diam:  3.20 cm  MITRAL VALVE               TRICUSPID VALVE MV Area (PHT): cm         Estimated RAP:  3.00 mmHg MV Decel Time: 195 msec MV E velocity: 61.45 cm/s  SHUNTS MV A velocity: 92.62 cm/s  Systemic VTI:  0.15 m MV E/A ratio:  0.66        Systemic Diam: 2.00 cm  Oneil Parchment MD Electronically signed by Oneil Parchment MD Signature Date/Time: 09/19/2022/4:23:12 PM    Final          ______________________________________________________________________________________________       Current Reported Medications:.    Current Meds  Medication Sig   albuterol  (PROAIR  HFA)  108 (90 Base) MCG/ACT inhaler Inhale 2 puffs into the lungs every 4 (four) hours as needed for wheezing or shortness of breath.   amLODipine  (NORVASC ) 10 MG tablet Take 1 tablet (10 mg total) by mouth daily.   aspirin  81 MG chewable tablet Chew 1 tablet (81 mg total) by mouth daily.   atorvastatin  (LIPITOR ) 80 MG tablet Take 1 tablet (80 mg total) by mouth daily.   Biotin  5000 MCG CAPS Take  5,000 mcg by mouth daily.   carvedilol  (COREG ) 25 MG tablet Take 1 tablet (25 mg total) by mouth 2 (two) times daily.   divalproex  (DEPAKOTE  ER) 500 MG 24 hr tablet Take 1 tablet (500 mg total) by mouth at bedtime.   empagliflozin  ([name] ) 10 MG TABS tablet Take 1 tablet by mouth once daily   ferrous sulfate  325 (65 FE) MG tablet Take 325 mg by mouth daily with breakfast.   isosorbide  mononitrate (IMDUR ) 30 MG 24 hr tablet Take 1 tablet (30 mg total) by mouth daily.   latanoprost  (XALATAN ) 0.005 % ophthalmic solution Place 1 drop into both eyes at bedtime.   losartan  (COZAAR ) 25 MG tablet Take 1 tablet (25 mg total) by mouth daily.   magnesium  oxide (MAG-OX) 400 (240 Mg) MG tablet Take 1 tablet (400 mg total) by mouth daily.   memantine  (NAMENDA ) 10 MG tablet Take 1 tablet (10 mg total) by mouth 2 (two) times daily.   metFORMIN  (GLUCOPHAGE ) 1000 MG tablet Take 1,000 mg by mouth 2 (two) times daily with a meal.   montelukast  (SINGULAIR ) 10 MG tablet TAKE 1 TABLET BY MOUTH AT  BEDTIME   Multiple Vitamins-Minerals (ONE-A-DAY WOMENS PO) Take 1 tablet by mouth daily with breakfast.   nitroGLYCERIN  (NITROSTAT ) 0.4 MG SL tablet Place 1 tablet (0.4 mg total) under the tongue every 5 (five) minutes as needed for chest pain. Don't exceed more than 3 doses in 15 mins   pantoprazole  (PROTONIX ) 40 MG tablet Take 1 tablet (40 mg total) by mouth 2 (two) times daily before a meal. (Patient taking differently: Take 40 mg by mouth daily.)   Probiotic Product (EQ PROBIOTIC) CAPS Take 1 capsule by mouth daily.   tiZANidine  (ZANAFLEX ) 2 MG tablet Take 2 mg by mouth every 8 (eight) hours as needed.   traZODone (DESYREL) 50 MG tablet Take 100 mg by mouth at bedtime as needed.   Physical Exam:    VS:  BP 112/62   Pulse 72   Ht 5' 2 (1.575 m)   Wt 139 lb 12.8 oz (63.4 kg)   LMP  (LMP Unknown)   BMI 25.57 kg/m    Wt Readings from Last 3 Encounters:  12/21/23 139 lb 12.8 oz (63.4 kg)  11/01/23 137 lb 12.8 oz  (62.5 kg)  09/04/23 135 lb 8 oz (61.5 kg)    GEN: Well nourished, well developed in no acute distress NECK: No JVD; No carotid bruits CARDIAC: RRR, no murmurs, rubs, gallops RESPIRATORY:  Clear to auscultation without rales, wheezing or rhonchi  ABDOMEN: Soft, non-tender, non-distended EXTREMITIES:  No edema; No acute deformity     Asessement and Plan:.    CAD: S/p proximal and mid RCA stents in 2017.  Patient with inferior STEMI in April 2024, found to have high-grade proximal and ostial RCA disease treated with shockwave lithotripsy and 1 DES, high-grade distal RCA disease treated with 1 DES with plaque shift into the jailed PDA branch. Stable with no anginal symptoms. No indication for ischemic evaluation. Heart healthy diet  and regular cardiovascular exercise encouraged.  Reviewed ED precautions.  Continue amlodipine  10 mg daily, aspirin  81 mg daily, Lipitor  80 mg daily, carvedilol  25 mg twice daily, [name]  10 mg daily, Imdur  30 mg daily, losartan  25 mg daily.  Hypertension: Blood pressure today 112/62.  Patient reports that her blood pressure has been significantly well-controlled.  Continue current antihypertensive regimen.  Hypercholesterolemia: Patient's most recent lipid profile 02/06/2023 indicated total cholesterol 183, HDL 51, triglycerides 259 and LDL 89. Check fasting lipid profile and LFTs   Chronic diastolic HF: Echocardiogram on 09/19/2022 indicated LVEF 60 to 65%, no RWMA, mild LVH, G1 DD, RV systolic function and size was normal, no evidence of cardiac tamponade, no aortic stenosis.  Patient appears euvolemic and well compensated on exam.   Disposition: F/u with Dr. Jeffrie in 6 months or Soriya Worster, NP sooner if needed.   Signed, Kingjames Coury D Shantel Wesely, NP

## 2023-12-21 ENCOUNTER — Encounter: Payer: Self-pay | Admitting: Cardiology

## 2023-12-21 ENCOUNTER — Ambulatory Visit: Attending: Cardiology | Admitting: Cardiology

## 2023-12-21 VITALS — BP 112/62 | HR 72 | Ht 62.0 in | Wt 139.8 lb

## 2023-12-21 DIAGNOSIS — I5032 Chronic diastolic (congestive) heart failure: Secondary | ICD-10-CM | POA: Diagnosis not present

## 2023-12-21 DIAGNOSIS — I2510 Atherosclerotic heart disease of native coronary artery without angina pectoris: Secondary | ICD-10-CM | POA: Diagnosis not present

## 2023-12-21 DIAGNOSIS — E7800 Pure hypercholesterolemia, unspecified: Secondary | ICD-10-CM

## 2023-12-21 DIAGNOSIS — I10 Essential (primary) hypertension: Secondary | ICD-10-CM

## 2023-12-21 NOTE — Patient Instructions (Signed)
 Medication Instructions:  No changes *If you need a refill on your cardiac medications before your next appointment, please call your pharmacy*  Lab Work: Next week we are going to need a fasting lipid panel and Lft's  If you have labs (blood work) drawn today and your tests are completely normal, you will receive your results only by: MyChart Message (if you have MyChart) OR A paper copy in the mail If you have any lab test that is abnormal or we need to change your treatment, we will call you to review the results.  Testing/Procedures: No testing  Follow-Up: At [facility], you and your health needs are our priority.  As part of our continuing mission to provide you with exceptional heart care, our providers are all part of one team.  This team includes your primary Cardiologist (physician) and Advanced Practice Providers or APPs (Physician Assistants and Nurse Practitioners) who all work together to provide you with the care you need, when you need it.  Your next appointment:   6 month(s)  Provider:   Oneil Parchment, MD

## 2023-12-26 DIAGNOSIS — H2513 Age-related nuclear cataract, bilateral: Secondary | ICD-10-CM | POA: Diagnosis not present

## 2023-12-26 DIAGNOSIS — H40023 Open angle with borderline findings, high risk, bilateral: Secondary | ICD-10-CM | POA: Diagnosis not present

## 2023-12-26 DIAGNOSIS — H04123 Dry eye syndrome of bilateral lacrimal glands: Secondary | ICD-10-CM | POA: Diagnosis not present

## 2023-12-26 DIAGNOSIS — E119 Type 2 diabetes mellitus without complications: Secondary | ICD-10-CM | POA: Diagnosis not present

## 2023-12-26 DIAGNOSIS — H35033 Hypertensive retinopathy, bilateral: Secondary | ICD-10-CM | POA: Diagnosis not present

## 2024-01-01 DIAGNOSIS — E7800 Pure hypercholesterolemia, unspecified: Secondary | ICD-10-CM | POA: Diagnosis not present

## 2024-01-01 DIAGNOSIS — I10 Essential (primary) hypertension: Secondary | ICD-10-CM | POA: Diagnosis not present

## 2024-01-01 DIAGNOSIS — I2510 Atherosclerotic heart disease of native coronary artery without angina pectoris: Secondary | ICD-10-CM | POA: Diagnosis not present

## 2024-01-01 DIAGNOSIS — I5032 Chronic diastolic (congestive) heart failure: Secondary | ICD-10-CM | POA: Diagnosis not present

## 2024-01-02 ENCOUNTER — Ambulatory Visit: Payer: Self-pay | Admitting: Cardiology

## 2024-01-02 DIAGNOSIS — E785 Hyperlipidemia, unspecified: Secondary | ICD-10-CM

## 2024-01-02 LAB — HEPATIC FUNCTION PANEL
ALT: 14 IU/L (ref 0–32)
AST: 17 IU/L (ref 0–40)
Albumin: 4.3 g/dL (ref 3.8–4.8)
Alkaline Phosphatase: 71 IU/L (ref 44–121)
Bilirubin Total: 0.5 mg/dL (ref 0.0–1.2)
Bilirubin, Direct: 0.18 mg/dL (ref 0.00–0.40)
Total Protein: 6.6 g/dL (ref 6.0–8.5)

## 2024-01-02 LAB — LIPID PANEL
Chol/HDL Ratio: 2.5 ratio (ref 0.0–4.4)
Cholesterol, Total: 172 mg/dL (ref 100–199)
HDL: 69 mg/dL (ref >39)
LDL Chol Calc (NIH): 86 mg/dL (ref 0–99)
Triglycerides: 97 mg/dL (ref 0–149)
VLDL Cholesterol Cal: 17 mg/dL (ref 5–40)

## 2024-01-07 DIAGNOSIS — J069 Acute upper respiratory infection, unspecified: Secondary | ICD-10-CM | POA: Diagnosis not present

## 2024-01-07 DIAGNOSIS — M940 Chondrocostal junction syndrome [Tietze]: Secondary | ICD-10-CM | POA: Diagnosis not present

## 2024-01-07 DIAGNOSIS — U071 COVID-19: Secondary | ICD-10-CM | POA: Diagnosis not present

## 2024-01-07 DIAGNOSIS — J45901 Unspecified asthma with (acute) exacerbation: Secondary | ICD-10-CM | POA: Diagnosis not present

## 2024-01-13 ENCOUNTER — Other Ambulatory Visit: Payer: Self-pay | Admitting: Adult Health

## 2024-01-14 DIAGNOSIS — E0840 Diabetes mellitus due to underlying condition with diabetic neuropathy, unspecified: Secondary | ICD-10-CM | POA: Diagnosis not present

## 2024-01-14 DIAGNOSIS — B351 Tinea unguium: Secondary | ICD-10-CM | POA: Diagnosis not present

## 2024-01-14 DIAGNOSIS — I739 Peripheral vascular disease, unspecified: Secondary | ICD-10-CM | POA: Diagnosis not present

## 2024-01-14 DIAGNOSIS — M79675 Pain in left toe(s): Secondary | ICD-10-CM | POA: Diagnosis not present

## 2024-01-14 DIAGNOSIS — M2012 Hallux valgus (acquired), left foot: Secondary | ICD-10-CM | POA: Diagnosis not present

## 2024-01-24 ENCOUNTER — Other Ambulatory Visit: Payer: Self-pay | Admitting: Internal Medicine

## 2024-01-24 DIAGNOSIS — J4550 Severe persistent asthma, uncomplicated: Secondary | ICD-10-CM

## 2024-01-24 DIAGNOSIS — J301 Allergic rhinitis due to pollen: Secondary | ICD-10-CM

## 2024-02-20 ENCOUNTER — Other Ambulatory Visit: Payer: Self-pay | Admitting: Medical Genetics

## 2024-02-22 DIAGNOSIS — I2510 Atherosclerotic heart disease of native coronary artery without angina pectoris: Secondary | ICD-10-CM | POA: Diagnosis not present

## 2024-02-22 DIAGNOSIS — J4550 Severe persistent asthma, uncomplicated: Secondary | ICD-10-CM | POA: Diagnosis not present

## 2024-02-22 DIAGNOSIS — I10 Essential (primary) hypertension: Secondary | ICD-10-CM | POA: Diagnosis not present

## 2024-02-22 DIAGNOSIS — E1142 Type 2 diabetes mellitus with diabetic polyneuropathy: Secondary | ICD-10-CM | POA: Diagnosis not present

## 2024-02-22 DIAGNOSIS — Z0000 Encounter for general adult medical examination without abnormal findings: Secondary | ICD-10-CM | POA: Diagnosis not present

## 2024-02-22 DIAGNOSIS — F5101 Primary insomnia: Secondary | ICD-10-CM | POA: Diagnosis not present

## 2024-02-22 DIAGNOSIS — E7800 Pure hypercholesterolemia, unspecified: Secondary | ICD-10-CM | POA: Diagnosis not present

## 2024-02-22 DIAGNOSIS — G40909 Epilepsy, unspecified, not intractable, without status epilepticus: Secondary | ICD-10-CM | POA: Diagnosis not present

## 2024-02-22 DIAGNOSIS — K219 Gastro-esophageal reflux disease without esophagitis: Secondary | ICD-10-CM | POA: Diagnosis not present

## 2024-02-25 ENCOUNTER — Emergency Department (HOSPITAL_BASED_OUTPATIENT_CLINIC_OR_DEPARTMENT_OTHER)

## 2024-02-25 ENCOUNTER — Encounter (HOSPITAL_BASED_OUTPATIENT_CLINIC_OR_DEPARTMENT_OTHER): Payer: Self-pay

## 2024-02-25 ENCOUNTER — Observation Stay (HOSPITAL_BASED_OUTPATIENT_CLINIC_OR_DEPARTMENT_OTHER)
Admission: EM | Admit: 2024-02-25 | Discharge: 2024-03-01 | Disposition: A | Discharge status: Home / Self Care | Attending: Internal Medicine | Admitting: Internal Medicine

## 2024-02-25 ENCOUNTER — Other Ambulatory Visit: Payer: Self-pay

## 2024-02-25 DIAGNOSIS — I10 Essential (primary) hypertension: Secondary | ICD-10-CM | POA: Diagnosis present

## 2024-02-25 DIAGNOSIS — E1165 Type 2 diabetes mellitus with hyperglycemia: Secondary | ICD-10-CM | POA: Diagnosis not present

## 2024-02-25 DIAGNOSIS — F32A Depression, unspecified: Secondary | ICD-10-CM | POA: Diagnosis not present

## 2024-02-25 DIAGNOSIS — J4489 Other specified chronic obstructive pulmonary disease: Secondary | ICD-10-CM | POA: Insufficient documentation

## 2024-02-25 DIAGNOSIS — Z951 Presence of aortocoronary bypass graft: Secondary | ICD-10-CM | POA: Diagnosis not present

## 2024-02-25 DIAGNOSIS — I2510 Atherosclerotic heart disease of native coronary artery without angina pectoris: Secondary | ICD-10-CM | POA: Diagnosis not present

## 2024-02-25 DIAGNOSIS — G3184 Mild cognitive impairment, so stated: Secondary | ICD-10-CM | POA: Diagnosis present

## 2024-02-25 DIAGNOSIS — Z86711 Personal history of pulmonary embolism: Secondary | ICD-10-CM | POA: Diagnosis not present

## 2024-02-25 DIAGNOSIS — R0989 Other specified symptoms and signs involving the circulatory and respiratory systems: Secondary | ICD-10-CM | POA: Diagnosis not present

## 2024-02-25 DIAGNOSIS — J449 Chronic obstructive pulmonary disease, unspecified: Secondary | ICD-10-CM | POA: Diagnosis present

## 2024-02-25 DIAGNOSIS — K219 Gastro-esophageal reflux disease without esophagitis: Secondary | ICD-10-CM | POA: Diagnosis not present

## 2024-02-25 DIAGNOSIS — I130 Hypertensive heart and chronic kidney disease with heart failure and stage 1 through stage 4 chronic kidney disease, or unspecified chronic kidney disease: Secondary | ICD-10-CM | POA: Diagnosis not present

## 2024-02-25 DIAGNOSIS — I5032 Chronic diastolic (congestive) heart failure: Secondary | ICD-10-CM | POA: Diagnosis present

## 2024-02-25 DIAGNOSIS — I252 Old myocardial infarction: Secondary | ICD-10-CM | POA: Diagnosis not present

## 2024-02-25 DIAGNOSIS — N1831 Chronic kidney disease, stage 3a: Secondary | ICD-10-CM | POA: Diagnosis present

## 2024-02-25 DIAGNOSIS — F329 Major depressive disorder, single episode, unspecified: Secondary | ICD-10-CM | POA: Diagnosis present

## 2024-02-25 DIAGNOSIS — Z86718 Personal history of other venous thrombosis and embolism: Secondary | ICD-10-CM

## 2024-02-25 DIAGNOSIS — D539 Nutritional anemia, unspecified: Secondary | ICD-10-CM | POA: Diagnosis present

## 2024-02-25 DIAGNOSIS — Z87891 Personal history of nicotine dependence: Secondary | ICD-10-CM | POA: Insufficient documentation

## 2024-02-25 DIAGNOSIS — E1122 Type 2 diabetes mellitus with diabetic chronic kidney disease: Secondary | ICD-10-CM | POA: Diagnosis not present

## 2024-02-25 DIAGNOSIS — F419 Anxiety disorder, unspecified: Secondary | ICD-10-CM | POA: Diagnosis not present

## 2024-02-25 DIAGNOSIS — Z7982 Long term (current) use of aspirin: Secondary | ICD-10-CM | POA: Diagnosis not present

## 2024-02-25 DIAGNOSIS — E1142 Type 2 diabetes mellitus with diabetic polyneuropathy: Secondary | ICD-10-CM | POA: Diagnosis present

## 2024-02-25 DIAGNOSIS — R569 Unspecified convulsions: Secondary | ICD-10-CM | POA: Diagnosis not present

## 2024-02-25 DIAGNOSIS — Z6830 Body mass index (BMI) 30.0-30.9, adult: Secondary | ICD-10-CM | POA: Diagnosis not present

## 2024-02-25 DIAGNOSIS — I517 Cardiomegaly: Secondary | ICD-10-CM | POA: Diagnosis not present

## 2024-02-25 DIAGNOSIS — D72829 Elevated white blood cell count, unspecified: Secondary | ICD-10-CM | POA: Insufficient documentation

## 2024-02-25 DIAGNOSIS — J4550 Severe persistent asthma, uncomplicated: Secondary | ICD-10-CM | POA: Diagnosis present

## 2024-02-25 DIAGNOSIS — E669 Obesity, unspecified: Secondary | ICD-10-CM | POA: Diagnosis present

## 2024-02-25 DIAGNOSIS — G40909 Epilepsy, unspecified, not intractable, without status epilepticus: Secondary | ICD-10-CM

## 2024-02-25 DIAGNOSIS — R29898 Other symptoms and signs involving the musculoskeletal system: Secondary | ICD-10-CM | POA: Diagnosis not present

## 2024-02-25 DIAGNOSIS — R4182 Altered mental status, unspecified: Secondary | ICD-10-CM | POA: Diagnosis not present

## 2024-02-25 DIAGNOSIS — Z79899 Other long term (current) drug therapy: Secondary | ICD-10-CM | POA: Diagnosis not present

## 2024-02-25 DIAGNOSIS — J4541 Moderate persistent asthma with (acute) exacerbation: Secondary | ICD-10-CM | POA: Diagnosis not present

## 2024-02-25 DIAGNOSIS — I6782 Cerebral ischemia: Secondary | ICD-10-CM | POA: Diagnosis not present

## 2024-02-25 DIAGNOSIS — R059 Cough, unspecified: Secondary | ICD-10-CM | POA: Diagnosis not present

## 2024-02-25 DIAGNOSIS — R0602 Shortness of breath: Secondary | ICD-10-CM | POA: Diagnosis not present

## 2024-02-25 DIAGNOSIS — R29818 Other symptoms and signs involving the nervous system: Secondary | ICD-10-CM | POA: Diagnosis not present

## 2024-02-25 DIAGNOSIS — F418 Other specified anxiety disorders: Secondary | ICD-10-CM | POA: Diagnosis present

## 2024-02-25 DIAGNOSIS — E785 Hyperlipidemia, unspecified: Secondary | ICD-10-CM | POA: Diagnosis not present

## 2024-02-25 DIAGNOSIS — I700 Atherosclerosis of aorta: Secondary | ICD-10-CM | POA: Diagnosis not present

## 2024-02-25 DIAGNOSIS — J4551 Severe persistent asthma with (acute) exacerbation: Secondary | ICD-10-CM

## 2024-02-25 DIAGNOSIS — E119 Type 2 diabetes mellitus without complications: Secondary | ICD-10-CM | POA: Diagnosis not present

## 2024-02-25 LAB — BASIC METABOLIC PANEL WITH GFR
Anion gap: 12 (ref 5–15)
BUN: 18 mg/dL (ref 8–23)
CO2: 23 mmol/L (ref 22–32)
Calcium: 9.7 mg/dL (ref 8.9–10.3)
Chloride: 103 mmol/L (ref 98–111)
Creatinine, Ser: 1.08 mg/dL — ABNORMAL HIGH (ref 0.44–1.00)
GFR, Estimated: 53 mL/min — ABNORMAL LOW (ref >60)
Glucose, Bld: 123 mg/dL — ABNORMAL HIGH (ref 70–99)
Potassium: 5.1 mmol/L (ref 3.5–5.1)
Sodium: 138 mmol/L (ref 135–145)

## 2024-02-25 LAB — URINALYSIS, ROUTINE W REFLEX MICROSCOPIC
Bacteria, UA: NONE SEEN
Bilirubin Urine: NEGATIVE
Glucose, UA: >1000 mg/dL — AB
Hgb urine dipstick: NEGATIVE
Ketones, ur: NEGATIVE mg/dL
Leukocytes,Ua: NEGATIVE
Nitrite: NEGATIVE
Protein, ur: NEGATIVE mg/dL
Specific Gravity, Urine: 1.025 (ref 1.005–1.030)
pH: 5.5 (ref 5.0–8.0)

## 2024-02-25 LAB — CBC WITH DIFFERENTIAL/PLATELET
Abs Immature Granulocytes: 0.02 K/uL (ref 0.00–0.07)
Basophils Absolute: 0.1 K/uL (ref 0.0–0.1)
Basophils Relative: 1 %
Eosinophils Absolute: 0.1 K/uL (ref 0.0–0.5)
Eosinophils Relative: 1 %
HCT: 35.2 % — ABNORMAL LOW (ref 36.0–46.0)
Hemoglobin: 11.7 g/dL — ABNORMAL LOW (ref 12.0–15.0)
Immature Granulocytes: 0 %
Lymphocytes Relative: 27 %
Lymphs Abs: 2 K/uL (ref 0.7–4.0)
MCH: 32.1 pg (ref 26.0–34.0)
MCHC: 33.2 g/dL (ref 30.0–36.0)
MCV: 96.7 fL (ref 80.0–100.0)
Monocytes Absolute: 0.7 K/uL (ref 0.1–1.0)
Monocytes Relative: 10 %
Neutro Abs: 4.4 K/uL (ref 1.7–7.7)
Neutrophils Relative %: 61 %
Platelets: 242 K/uL (ref 150–400)
RBC: 3.64 MIL/uL — ABNORMAL LOW (ref 3.87–5.11)
RDW: 13.9 % (ref 11.5–15.5)
WBC: 7.3 K/uL (ref 4.0–10.5)
nRBC: 0 % (ref 0.0–0.2)

## 2024-02-25 LAB — URINE DRUG SCREEN
Amphetamines: NEGATIVE
Barbiturates: NEGATIVE
Benzodiazepines: NEGATIVE
Cocaine: NEGATIVE
Fentanyl: NEGATIVE
Methadone Scn, Ur: NEGATIVE
Opiates: NEGATIVE
Tetrahydrocannabinol: NEGATIVE

## 2024-02-25 LAB — I-STAT VENOUS BLOOD GAS, ED
Acid-Base Excess: 4 mmol/L — ABNORMAL HIGH (ref 0.0–2.0)
Bicarbonate: 28.7 mmol/L — ABNORMAL HIGH (ref 20.0–28.0)
Calcium, Ion: 1.15 mmol/L (ref 1.15–1.40)
HCT: 36 % (ref 36.0–46.0)
Hemoglobin: 12.2 g/dL (ref 12.0–15.0)
O2 Saturation: 94 %
Potassium: 5.2 mmol/L — ABNORMAL HIGH (ref 3.5–5.1)
Sodium: 138 mmol/L (ref 135–145)
TCO2: 30 mmol/L (ref 22–32)
pCO2, Ven: 43.6 mmHg — ABNORMAL LOW (ref 44–60)
pH, Ven: 7.426 (ref 7.25–7.43)
pO2, Ven: 70 mmHg — ABNORMAL HIGH (ref 32–45)

## 2024-02-25 LAB — CBC
HCT: 36.5 % (ref 36.0–46.0)
Hemoglobin: 12.1 g/dL (ref 12.0–15.0)
MCH: 32 pg (ref 26.0–34.0)
MCHC: 33.2 g/dL (ref 30.0–36.0)
MCV: 96.6 fL (ref 80.0–100.0)
Platelets: 209 K/uL (ref 150–400)
RBC: 3.78 MIL/uL — ABNORMAL LOW (ref 3.87–5.11)
RDW: 14 % (ref 11.5–15.5)
WBC: 7.8 K/uL (ref 4.0–10.5)
nRBC: 0 % (ref 0.0–0.2)

## 2024-02-25 LAB — CBG MONITORING, ED: Glucose-Capillary: 122 mg/dL — ABNORMAL HIGH (ref 70–99)

## 2024-02-25 LAB — TROPONIN T, HIGH SENSITIVITY: Troponin T High Sensitivity: <15 ng/L (ref 0–19)

## 2024-02-25 LAB — HEPATIC FUNCTION PANEL
ALT: 15 U/L (ref 0–44)
AST: 38 U/L (ref 15–41)
Albumin: 4.2 g/dL (ref 3.5–5.0)
Alkaline Phosphatase: 59 U/L (ref 38–126)
Bilirubin, Direct: <0.1 mg/dL (ref 0.0–0.2)
Total Bilirubin: 0.3 mg/dL (ref 0.0–1.2)
Total Protein: 7.2 g/dL (ref 6.5–8.1)

## 2024-02-25 LAB — AMMONIA: Ammonia: 18 umol/L (ref 9–35)

## 2024-02-25 LAB — PROTIME-INR
INR: 0.9 (ref 0.8–1.2)
Prothrombin Time: 12.5 s (ref 11.4–15.2)

## 2024-02-25 LAB — LIPASE, BLOOD: Lipase: 33 U/L (ref 11–51)

## 2024-02-25 LAB — APTT: aPTT: 29 s (ref 24–36)

## 2024-02-25 LAB — VALPROIC ACID LEVEL: Valproic Acid Lvl: 30 ug/mL — ABNORMAL LOW (ref 50–100)

## 2024-02-25 LAB — PRO BRAIN NATRIURETIC PEPTIDE: Pro Brain Natriuretic Peptide: 57.4 pg/mL (ref <300.0)

## 2024-02-25 MED ORDER — DEXTROSE 5 % IV SOLN
1000.0000 mg | INTRAVENOUS | Status: AC
  Administered 2024-02-25: 1000 mg via INTRAVENOUS
  Filled 2024-02-25: qty 10

## 2024-02-25 MED ORDER — LATANOPROST 0.005 % OP SOLN
1.0000 [drp] | Freq: Every day | OPHTHALMIC | Status: DC
  Administered 2024-02-26 – 2024-02-29 (×4): 1 [drp] via OPHTHALMIC
  Filled 2024-02-25: qty 2.5

## 2024-02-25 MED ORDER — MAGNESIUM OXIDE -MG SUPPLEMENT 400 (240 MG) MG PO TABS
400.0000 mg | ORAL_TABLET | Freq: Every day | ORAL | Status: DC
  Administered 2024-02-26 – 2024-03-01 (×5): 400 mg via ORAL
  Filled 2024-02-25 (×5): qty 1

## 2024-02-25 MED ORDER — LORAZEPAM 0.5 MG PO TABS
0.5000 mg | ORAL_TABLET | Freq: Every day | ORAL | Status: DC
  Administered 2024-02-25 – 2024-02-29 (×5): 0.5 mg via ORAL
  Filled 2024-02-25 (×5): qty 1

## 2024-02-25 MED ORDER — ALBUTEROL SULFATE (2.5 MG/3ML) 0.083% IN NEBU
5.0000 mg | INHALATION_SOLUTION | Freq: Once | RESPIRATORY_TRACT | Status: AC
  Administered 2024-02-25: 5 mg via RESPIRATORY_TRACT
  Filled 2024-02-25: qty 6

## 2024-02-25 MED ORDER — ATORVASTATIN CALCIUM 80 MG PO TABS
80.0000 mg | ORAL_TABLET | Freq: Every day | ORAL | Status: DC
  Administered 2024-02-26 – 2024-03-01 (×5): 80 mg via ORAL
  Filled 2024-02-25: qty 1
  Filled 2024-02-25: qty 2
  Filled 2024-02-25 (×3): qty 1

## 2024-02-25 MED ORDER — CARVEDILOL 12.5 MG PO TABS
25.0000 mg | ORAL_TABLET | Freq: Two times a day (BID) | ORAL | Status: DC
  Administered 2024-02-25 – 2024-03-01 (×10): 25 mg via ORAL
  Filled 2024-02-25 (×10): qty 2

## 2024-02-25 MED ORDER — ALBUTEROL SULFATE (2.5 MG/3ML) 0.083% IN NEBU
2.5000 mg | INHALATION_SOLUTION | Freq: Once | RESPIRATORY_TRACT | Status: AC
  Administered 2024-02-25: 2.5 mg via RESPIRATORY_TRACT
  Filled 2024-02-25: qty 3

## 2024-02-25 MED ORDER — EMPAGLIFLOZIN 10 MG PO TABS
10.0000 mg | ORAL_TABLET | Freq: Every day | ORAL | Status: DC
  Administered 2024-02-27 – 2024-03-01 (×4): 10 mg via ORAL
  Filled 2024-02-25 (×4): qty 1

## 2024-02-25 MED ORDER — IOHEXOL 350 MG/ML SOLN
100.0000 mL | Freq: Once | INTRAVENOUS | Status: AC | PRN
  Administered 2024-02-25: 100 mL via INTRAVENOUS

## 2024-02-25 MED ORDER — PANTOPRAZOLE SODIUM 40 MG PO TBEC
40.0000 mg | DELAYED_RELEASE_TABLET | Freq: Every day | ORAL | Status: DC
  Administered 2024-02-26 – 2024-03-01 (×5): 40 mg via ORAL
  Filled 2024-02-25 (×5): qty 1

## 2024-02-25 MED ORDER — IPRATROPIUM-ALBUTEROL 0.5-2.5 (3) MG/3ML IN SOLN
6.0000 mL | Freq: Once | RESPIRATORY_TRACT | Status: AC
  Administered 2024-02-25: 6 mL via RESPIRATORY_TRACT
  Filled 2024-02-25: qty 6

## 2024-02-25 MED ORDER — ACETAMINOPHEN-CODEINE 300-30 MG PO TABS
1.0000 | ORAL_TABLET | Freq: Once | ORAL | Status: AC
  Administered 2024-02-26: 1 via ORAL
  Filled 2024-02-25: qty 1

## 2024-02-25 MED ORDER — DIVALPROEX SODIUM ER 500 MG PO TB24
500.0000 mg | ORAL_TABLET | Freq: Every day | ORAL | Status: DC
  Administered 2024-02-25 – 2024-02-29 (×5): 500 mg via ORAL
  Filled 2024-02-25: qty 2
  Filled 2024-02-25 (×5): qty 1

## 2024-02-25 MED ORDER — SODIUM CHLORIDE 0.9 % IV BOLUS: 500.0000 mL | Freq: Once | INTRAVENOUS | Status: DC

## 2024-02-25 MED ORDER — AMLODIPINE BESYLATE 5 MG PO TABS
10.0000 mg | ORAL_TABLET | Freq: Every day | ORAL | Status: DC
  Administered 2024-02-26 – 2024-02-29 (×5): 10 mg via ORAL
  Filled 2024-02-25 (×5): qty 2

## 2024-02-25 MED ORDER — MEMANTINE HCL 10 MG PO TABS
10.0000 mg | ORAL_TABLET | Freq: Two times a day (BID) | ORAL | Status: DC
  Administered 2024-02-26 – 2024-03-01 (×8): 10 mg via ORAL
  Filled 2024-02-25 (×8): qty 1

## 2024-02-25 MED ORDER — TRAZODONE HCL 100 MG PO TABS
100.0000 mg | ORAL_TABLET | Freq: Every evening | ORAL | Status: DC | PRN
  Administered 2024-02-26 – 2024-03-01 (×4): 100 mg via ORAL
  Filled 2024-02-25 (×4): qty 1

## 2024-02-25 MED ORDER — METHYLPREDNISOLONE SODIUM SUCC 125 MG IJ SOLR
125.0000 mg | INTRAMUSCULAR | Status: AC
  Administered 2024-02-25: 125 mg via INTRAVENOUS
  Filled 2024-02-25: qty 2

## 2024-02-25 MED ORDER — VALPROATE SODIUM 100 MG/ML IV SOLN
INTRAVENOUS | Status: AC
  Filled 2024-02-25: qty 5

## 2024-02-25 MED ORDER — ASPIRIN 81 MG PO CHEW
81.0000 mg | CHEWABLE_TABLET | Freq: Every day | ORAL | Status: DC
  Administered 2024-02-26 – 2024-03-01 (×5): 81 mg via ORAL
  Filled 2024-02-25 (×5): qty 1

## 2024-02-25 MED ORDER — MONTELUKAST SODIUM 10 MG PO TABS
10.0000 mg | ORAL_TABLET | Freq: Every day | ORAL | Status: DC
  Administered 2024-02-26 – 2024-02-29 (×4): 10 mg via ORAL
  Filled 2024-02-25 (×4): qty 1

## 2024-02-25 MED ORDER — TIZANIDINE HCL 4 MG PO TABS
2.0000 mg | ORAL_TABLET | Freq: Three times a day (TID) | ORAL | Status: DC | PRN
  Administered 2024-02-27 – 2024-03-01 (×4): 2 mg via ORAL
  Filled 2024-02-25 (×4): qty 1

## 2024-02-25 MED ORDER — LOSARTAN POTASSIUM 25 MG PO TABS
25.0000 mg | ORAL_TABLET | Freq: Every day | ORAL | Status: DC
  Administered 2024-02-26 – 2024-03-01 (×5): 25 mg via ORAL
  Filled 2024-02-25 (×5): qty 1

## 2024-02-25 MED ORDER — ISOSORBIDE MONONITRATE ER 30 MG PO TB24
30.0000 mg | ORAL_TABLET | Freq: Every day | ORAL | Status: DC
  Administered 2024-02-27 – 2024-03-01 (×4): 30 mg via ORAL
  Filled 2024-02-25 (×4): qty 1

## 2024-02-25 MED ORDER — METFORMIN HCL 500 MG PO TABS
1000.0000 mg | ORAL_TABLET | Freq: Two times a day (BID) | ORAL | Status: DC
  Administered 2024-02-25 – 2024-03-01 (×10): 1000 mg via ORAL
  Filled 2024-02-25 (×10): qty 2

## 2024-02-25 MED ORDER — METHOCARBAMOL 500 MG PO TABS
500.0000 mg | ORAL_TABLET | Freq: Once | ORAL | Status: AC
  Administered 2024-02-26: 500 mg via ORAL
  Filled 2024-02-25: qty 1

## 2024-02-25 MED ORDER — VALPROATE SODIUM 100 MG/ML IV SOLN
INTRAVENOUS | Status: AC
  Administered 2024-02-25: 1000 mg
  Filled 2024-02-25: qty 5

## 2024-02-25 MED ORDER — ALBUTEROL SULFATE HFA 108 (90 BASE) MCG/ACT IN AERS: 2.0000 | INHALATION_SPRAY | RESPIRATORY_TRACT | Status: DC | PRN

## 2024-02-25 MED ORDER — IPRATROPIUM-ALBUTEROL 0.5-2.5 (3) MG/3ML IN SOLN
3.0000 mL | RESPIRATORY_TRACT | Status: AC
  Administered 2024-02-25: 3 mL via RESPIRATORY_TRACT
  Filled 2024-02-25: qty 3

## 2024-02-25 MED ORDER — FERROUS SULFATE 325 (65 FE) MG PO TABS
325.0000 mg | ORAL_TABLET | Freq: Every day | ORAL | Status: DC
  Administered 2024-02-26 – 2024-03-01 (×5): 325 mg via ORAL
  Filled 2024-02-25 (×5): qty 1

## 2024-02-25 MED ORDER — OXYCODONE-ACETAMINOPHEN 5-325 MG PO TABS
1.0000 | ORAL_TABLET | Freq: Once | ORAL | Status: AC
  Administered 2024-02-25: 1 via ORAL
  Filled 2024-02-25: qty 1

## 2024-02-25 NOTE — Plan of Care (Signed)
 Plan of Care Note for accepted transfer   Patient name: [name] FMW:[id number] DOB: May 02, 1950  Facility requesting transfer: [facility] Requesting Provider: Neldon Hamp RAMAN, PA  Reason for transfer: Asthma exacerbation, AMS Facility course: 73 year old female with history of asthma, HFpEF, CAD, dementia, anxiety, depression, diabetes, hyperlipidemia, GERD, hypertension, seizure, CKD stage III presented to the ED for elevation of cough, wheezing, and chest pressure.  Vital signs on arrival: Temperature 98.1 F, pulse 84, respiratory rate 22, blood pressure 152/71, and SpO2 99% on room air.  While talking to ED PA, patient stopped responding to questions and was staring towards the wall and then closes her eyes.  She only responded to high-pressure sternal rubs by grimacing.  After that she was somewhat confused and would not answer questions and would only weakly follow commands such as squeezing hands bilaterally.  Due to change in mental status, code stroke was called at 4:15 PM.  Patient was evaluated by teleneurology and had CT head done which was negative for acute abnormalities.  Neurology felt that she is not a thrombolytic candidate due to minimal concern for stroke and it was felt that her episodes are consistent with spells that she has had in the past per outpatient neurology notes.  Unclear whether these are secondary to seizure versus stress reaction.  She is on Depakote  500 mg nightly.  Teleneurology recommended continuing current dose of her home Depakote  and if she has repeated episodes, then load with IV Depakote  1000 mg x 1 and obtain inpatient EEG.  Recommended no additional imaging at this time but can consider brain MRI with and without contrast if recurrent episodes.  Labs showing no leukocytosis, no hypoglycemia, VBG with pH 7.42 and pCO2 43.6, ammonia level normal, UA not suggestive of infection, UDS negative, proBNP normal, troponin negative x 1.  EKG not suggestive of STEMI.   CTA chest negative for PE or acute intrathoracic abnormality.  CT abdomen pelvis negative for acute findings.  Patient received albuterol  and DuoNeb treatments, Solu-Medrol , and Percocet.  Serum Depakote  level was subtherapeutic so she was given loading dose IV Depakote .  Plan of care: The patient is accepted for admission to Progressive unit at [facility].  [facility] will assume care on arrival to accepting facility. Until arrival, care as per EDP. However, [facility] available 24/7 for questions and assistance.  Check [email or web address] for on-call coverage.  Nursing staff, please call [facility] Admits & Consults System-Wide number under Amion on patient's arrival so appropriate admitting provider can evaluate the pt.

## 2024-02-25 NOTE — Consult Note (Signed)
 [facility] Consult Services   Patient Name:   [name], [name] Date of Birth:   [birth year] Identification Number:   MRN - [id number] Date of Service:   02/25/2024 16:59:07  Impression:      73 year old female with hypertension, hyperlipidemia, diabetes, who presented to the ER for an asthma attack and has since had 3 episodes lasting less than 30 seconds of either blank staring, spacing out or head slumped over. During the event, she is noninteractive. Afterwards, she has no recollection of the event. Currently Stroke Scale is 8, likely effort-related generalized symmetric weakness. No other deficits noted. Head CT negative for acute abnormalities. Not a thrombolytic candidate due to minimal concern for stroke. Her episodes are consistent with spells that she has had in the past. Per outpatient Neuro notes, patient has brief AMS spells with spacing out. Unclear if these are secondary to seizure vs stress reaction. She remains on Depakote  ER 500 mg qhs.    PLAN  - continue Depakote  ER 500 mg qhs  - if she has repeated episodes, load with Depakote  1000 mg IV x1 and obtain inpatient EEG  - no additional imaging needed at this time, can consider MRI brain w/ and w/o contrast if recurrent episodes  - refer to Outpatient Neuro for continued follow up  ---  Our recommendations are outlined below.  Recommendations:       Neuro Checks       Bedside Swallow Eval       DVT Prophylaxis       IV Fluids, Normal Saline       Head of Bed 30 Degrees       Euglycemia and Avoid Hyperthermia (PRN Acetaminophen )  Sign Out:       Discussed with Emergency Department Provider    ------------------------------------------------------------------------------  Advanced Imaging: Advanced Imaging Deferred because:  Non-disabling symptoms as verified by the patient; no cortical signs so not consistent with LVO   Metrics: Last Known Well: 02/25/2024 16:30:00 Dispatch Time:  02/25/2024 16:59:07 Initial Response Time: 02/25/2024 17:02:02 Symptoms: AMS. Initial patient interaction: 02/25/2024 17:10:33 NIHSS Assessment Completed: 02/25/2024 17:20:44 Patient is not a candidate for Thrombolytic. Thrombolytic Medical Decision: 02/25/2024 17:20:49 Patient was not deemed candidate for Thrombolytic because of following reasons: other diagnosis suspected either seizure vs stress reaction.  CT Head: CT head unremarkable for acute infarction or hemorrhage per Radiology: no acute abnl  Primary Provider Notified of Diagnostic Impression and Management Plan on: 02/25/2024 17:57:44    ------------------------------------------------------------------------------  History of Present Illness: Patient is a 73 year old Female.  73 year old female with hypertension, hyperlipidemia, diabetes, who initially presented to the ER for an asthma attack. Patient's daughter reported that she was wheezing earlier during the day. Since being here in the ER, she has had three witnessed episodes of either blank staring or slumping over with altered mentation. I witnessed the tail end of one of these episodes when I signed on to the camera. Patient was sitting in bed with her head slumped over and was unresponsive to voice or stimuli for 5 to 10 seconds. Afterwards she perked up and was answering questions appropriately. However, she is poorly cooperative, refusing to perform various parts of the exam, asking why she is in the hospital and saying that she should be home. When asked if she is aware she is having episodes of spacing out, she denies it. Currently her Stroke Scale is 8, exam is notable for symmetric generalized weakness, likely secondary to poor  effort.  Upon review of her chart, a note from Neurology NP in June 2025 documents the following: She had her first generalized seizure in 2011, was taken to [facility] evaluated by neurologist there, per patient, there was a  suggestion of antiepileptic medications, however it was never followed through. Over the years, she had no generalized seizure, but every few weeks to every few months, she would have episodes of sudden onset confusion, space out, lasting less than 5 minutes She went through extreme stress (her grandson died) in 01-20-17since then, she began to have increased spells, almost daily basis now, she noted when she was reading, cooking, has transient time elapsed, she came to confused, sometimes preceded by nausea dry mouth sensation.  She is on Depakote  ER 500 mg qhs.  ---  Past Medical History:      Hypertension      Diabetes Mellitus      Hyperlipidemia  No Anticoagulant use  Antiplatelet use: Yes asa Reviewed EMR for current medications  Allergies:  Reviewed  Social History: Drug Use: No  Family History: There is no family history of premature cerebrovascular disease pertinent to this consultation  Examination: 1A: Level of Consciousness - Alert; keenly responsive + 0 1B: Ask Month and Age - Both Questions Right + 0 1C: Blink Eyes & Squeeze Hands - Performs Both Tasks + 0 2: Test Horizontal Extraocular Movements - Normal + 0 3: Test Visual Fields - No Visual Loss + 0 4: Test Facial Palsy (Use Grimace if Obtunded) - Normal symmetry + 0 5A: Test Left Arm Motor Drift - Some Effort Against Gravity + 2 5B: Test Right Arm Motor Drift - Some Effort Against Gravity + 2 6A: Test Left Leg Motor Drift - Some Effort Against Gravity + 2 6B: Test Right Leg Motor Drift - Some Effort Against Gravity + 2 7: Test Limb Ataxia (FNF/Heel-Shin) - No Ataxia + 0 8: Test Sensation - Normal; No sensory loss + 0 9: Test Language/Aphasia - Normal; No aphasia + 0 10: Test Dysarthria - Normal + 0 11: Test Extinction/Inattention - No abnormality + 0  NIHSS Score: 8   Pre-Morbid Modified Rankin Scale: 0 Points = No symptoms at all  Spoke with : er provider I reviewed the available imaging via  Rapid and initiated discussion with the primary provider  This consult was conducted in real time using interactive audio and immunologist. Patient was informed of the technology being used for this visit and agreed to proceed. Patient located in hospital and provider located at home/office setting.   Patient is being evaluated for possible acute neurologic impairment and high probability of imminent or life-threatening deterioration. I spent total of 35 minutes providing care to this patient, including time for face to face visit via telemedicine, review of medical records, imaging studies and discussion of findings with providers, the patient and/or family.    Dr Ana Cowing   [facility] For Inpatient follow-up with [facility] physician please call [facility] at [phone]. As we are not an outpatient service for any post hospital discharge needs please contact the hospital for assistance. If you have any questions for the [facility] physicians or need to reconsult for clinical or diagnostic changes please contact us  via [facility] at [phone].  Non-radiologist review of imaging performed to assist with emergent clinical decision-making. Remote physician workstations do not possess the same resolution, calibration, or diagnostic capabilities as hospital-based radiology reading stations, and formal radiologist read is necessary.   Signature :  Laelynn Blizzard

## 2024-02-25 NOTE — ED Provider Notes (Signed)
 [name] EMERGENCY DEPARTMENT AT [facility] Provider Note   CSN: [id number] Arrival date & time: 02/25/24  1551     Patient presents with: Shortness of Breath and Code Stroke   [name] is a 73 y.o. female.    Shortness of Breath   Patient with several days of cough and wheezing that began yesterday no hemoptysis endorses some chest pressure but no focal chest pain some nausea but no vomiting.  Denies any fevers or chills.   While interviewing patient she stopped responding to my questions and was staring towards the wall and then closed her eyes.  She responded high-pressure sternal rub by grimacing.  After that she was somewhat confused and would not answer questions and would only weakly follow commands such as squeezing hands bilaterally.  Code stroke called -- change in MS at 4:15pm     Prior to Admission medications   Medication Sig Start Date End Date Taking? Authorizing Provider  albuterol  (PROAIR  HFA) 108 (90 Base) MCG/ACT inhaler Inhale 2 puffs into the lungs every 4 (four) hours as needed for wheezing or shortness of breath. 09/26/22   [name], MD  amLODipine  (NORVASC ) 10 MG tablet Take 1 tablet (10 mg total) by mouth daily. 11/01/23 01/30/24  West, Katlyn D, NP  aspirin  81 MG chewable tablet Chew 1 tablet (81 mg total) by mouth daily. 08/15/22   Henry Manuelita NOVAK, NP  atorvastatin  (LIPITOR ) 80 MG tablet Take 1 tablet (80 mg total) by mouth daily. 10/29/23   Jeffrie Oneil BROCKS, MD  Biotin  5000 MCG CAPS Take 5,000 mcg by mouth daily.    [provider]  carvedilol  (COREG ) 25 MG tablet Take 1 tablet (25 mg total) by mouth 2 (two) times daily. 03/20/23   Jerilynn Lamarr HERO, NP  divalproex  (DEPAKOTE  ER) 500 MG 24 hr tablet Take 1 tablet (500 mg total) by mouth at bedtime. 09/04/23   Gayland Lauraine PARAS, NP  empagliflozin  ([name] ) 10 MG TABS tablet Take 1 tablet by mouth once daily 10/29/23   Jeffrie Oneil BROCKS, MD  ferrous sulfate  325 (65 FE) MG tablet  Take 325 mg by mouth daily with breakfast. 07/15/21   [provider]  isosorbide  mononitrate (IMDUR ) 30 MG 24 hr tablet Take 1 tablet (30 mg total) by mouth daily. 03/20/23   Jerilynn Lamarr HERO, NP  latanoprost  (XALATAN ) 0.005 % ophthalmic solution Place 1 drop into both eyes at bedtime. 02/05/20   [provider]  LORazepam  (ATIVAN ) 0.5 MG tablet Take 0.5 mg by mouth at bedtime. Patient not taking: Reported on 12/21/2023 11/03/22   [provider]  losartan  (COZAAR ) 25 MG tablet Take 1 tablet (25 mg total) by mouth daily. 03/20/23   Jerilynn Lamarr HERO, NP  magnesium  oxide (MAG-OX) 400 (240 Mg) MG tablet Take 1 tablet (400 mg total) by mouth daily. 05/14/21   Tobie Yetta HERO, MD  memantine  (NAMENDA ) 10 MG tablet Take 1 tablet (10 mg total) by mouth 2 (two) times daily. 09/04/23   Gayland Lauraine PARAS, NP  metFORMIN  (GLUCOPHAGE ) 1000 MG tablet Take 1,000 mg by mouth 2 (two) times daily with a meal.    [provider]  montelukast  (SINGULAIR ) 10 MG tablet TAKE 1 TABLET BY MOUTH AT  BEDTIME 03/02/23   Wert, Michael B, MD  Multiple Vitamins-Minerals (ONE-A-DAY WOMENS PO) Take 1 tablet by mouth daily with breakfast.    [provider]  nitroGLYCERIN  (NITROSTAT ) 0.4 MG SL tablet Place 1 tablet (0.4 mg total) under  the tongue every 5 (five) minutes as needed for chest pain. Don't exceed more than 3 doses in 15 mins 06/24/21   Cheryle Page, MD  pantoprazole  (PROTONIX ) 40 MG tablet Take 1 tablet (40 mg total) by mouth 2 (two) times daily before a meal. Patient taking differently: Take 40 mg by mouth daily. 06/24/21   Cheryle Page, MD  Probiotic Product (EQ PROBIOTIC) CAPS Take 1 capsule by mouth daily.    [provider]  tiZANidine  (ZANAFLEX ) 2 MG tablet Take 2 mg by mouth every 8 (eight) hours as needed. 02/02/23   [provider]  traZODone (DESYREL) 50 MG tablet Take 100 mg by mouth at bedtime as needed.    [provider]    Allergies:  Tramadol  and Chlorthalidone     Review of Systems  Respiratory:  Positive for shortness of breath.     Updated Vital Signs BP (!) 169/72   Pulse 83   Temp 98.1 F (36.7 C)   Resp (!) 22   LMP  (LMP Unknown)   SpO2 100%   Physical Exam Vitals and nursing note reviewed.  Constitutional:      General: She is not in acute distress. HENT:     Head: Normocephalic and atraumatic.     Nose: Nose normal.     Mouth/Throat:     Mouth: Mucous membranes are moist.  Eyes:     General: No scleral icterus. Cardiovascular:     Rate and Rhythm: Normal rate and regular rhythm.     Pulses: Normal pulses.     Heart sounds: Normal heart sounds.  Pulmonary:     Effort: No respiratory distress.     Breath sounds: Wheezing present.     Comments: Speaking in full sentences, expiratory wheezing present, mildly prolonged expiratory phase. Abdominal:     Palpations: Abdomen is soft.     Tenderness: There is abdominal tenderness. There is no guarding or rebound.  Musculoskeletal:     Cervical back: Normal range of motion.     Right lower leg: No edema.     Left lower leg: No edema.  Skin:    General: Skin is warm and dry.     Capillary Refill: Capillary refill takes less than 2 seconds.  Neurological:     Mental Status: She is alert. Mental status is at baseline.  Psychiatric:        Mood and Affect: Mood normal.        Behavior: Behavior normal.     (all labs ordered are listed, but only abnormal results are displayed) Labs Reviewed  CBC - Abnormal; Notable for the following components:      Result Value   RBC 3.78 (*)    All other components within normal limits  URINALYSIS, ROUTINE W REFLEX MICROSCOPIC - Abnormal; Notable for the following components:   Glucose, UA >1,000 (*)    All other components within normal limits  CBC WITH DIFFERENTIAL/PLATELET - Abnormal; Notable for the following components:   RBC 3.64 (*)    Hemoglobin 11.7 (*)    HCT 35.2 (*)    All other components  within normal limits  BASIC METABOLIC PANEL WITH GFR - Abnormal; Notable for the following components:   Glucose, Bld 123 (*)    Creatinine, Ser 1.08 (*)    GFR, Estimated 53 (*)    All other components within normal limits  VALPROIC ACID  LEVEL - Abnormal; Notable for the following components:   Valproic Acid  Lvl 30 (*)  All other components within normal limits  CBG MONITORING, ED - Abnormal; Notable for the following components:   Glucose-Capillary 122 (*)    All other components within normal limits  I-STAT VENOUS BLOOD GAS, ED - Abnormal; Notable for the following components:   pCO2, Ven 43.6 (*)    pO2, Ven 70 (*)    Bicarbonate 28.7 (*)    Acid-Base Excess 4.0 (*)    Potassium 5.2 (*)    All other components within normal limits  PROTIME-INR  APTT  URINE DRUG SCREEN  AMMONIA  HEPATIC FUNCTION PANEL  LIPASE, BLOOD  PRO BRAIN NATRIURETIC PEPTIDE  TROPONIN T, HIGH SENSITIVITY  TROPONIN T, HIGH SENSITIVITY    EKG: EKG Interpretation Date/Time:  Monday February 25 2024 16:29:31 EDT Ventricular Rate:  83 PR Interval:  151 QRS Duration:  93 QT Interval:  362 QTC Calculation: 426 R Axis:   4  Text Interpretation: Sinus rhythm Abnormal R-wave progression, late transition Left ventricular hypertrophy Borderline T abnormalities, anterior leads Confirmed by [name] ([id number]) on 02/25/2024 5:27:58 PM  Radiology: CT Angio Chest PE W/Cm &/Or Wo Cm Result Date: 02/25/2024 EXAM: CTA CHEST PE WITHOUT AND WITH CONTRAST CT ABDOMEN AND PELVIS WITHOUT AND WITH CONTRAST 02/25/2024 07:08:40 PM TECHNIQUE: CTA of the chest was performed after the administration of 100 mL of iohexol  (OMNIPAQUE ) 350 MG/ML injection. Multiplanar reformatted images are provided for review. MIP images are provided for review. CT of the abdomen and pelvis was performed without and with the administration of intravenous contrast. Automated exposure control, iterative reconstruction, and/or weight based  adjustment of the mA/kV was utilized to reduce the radiation dose to as low as reasonably achievable. COMPARISON: CT angiography chest 08/10/2022, CT abdomen and pelvis 04/19/2022, MRI abdomen 04/20/2022. CLINICAL HISTORY: Pulmonary embolism (PE) suspected, high prob. Md note: 73 year old female with hypertension, hyperlipidemia, diabetes, who presented to the ER for an asthma attack and has since had 3 episodes lasting less than 30 seconds of either blank staring, spacing out or head slumped over. During the event, she is noninteractive. Afterwards, she has no recollection of the event. Currently Stroke Scale is 8, likely effort-related generalized symmetric weakness. No other deficits noted. Head CT negative for acute abnormalities. Not a thrombolytic candidate due to minimal concern for stroke. Her episodes are consistent with spells that she has had in the past. Per outpatient Neuro notes, patient has brief AMS spells with spacing out. Unclear if these are secondary to seizure vs stress reaction. She remains on Depakote  ER 500 mg qhs. FINDINGS: CHEST: PULMONARY ARTERIES: Pulmonary arteries are adequately opacified for evaluation. No intraluminal filling defect to suggest pulmonary embolism. Main pulmonary artery is normal in caliber. MEDIASTINUM: No mediastinal lymphadenopathy. Prominent heart size. 4-vessel coronary artery calcification. Moderate atherosclerotic plaque of the thoracic aorta. LUNGS AND PLEURA: Persisted vague mosaic attenuation of the lungs, most prominent in the bilateral lower lobes, which may be due to small vessel disease versus atelectasis . No focal consolidation or pulmonary edema. No pleural effusion or pneumothorax. SOFT TISSUES AND BONES: No acute bone or soft tissue abnormality. ABDOMEN AND PELVIS: LIVER: Vague hypodense lesion along the falciform ligament likely focal fatty infiltration of the liver. Otherwise, no focal hepatic mass. The liver is normal in size. GALLBLADDER AND BILE  DUCTS: Gallbladder is unremarkable. No biliary ductal dilatation. SPLEEN: Spleen demonstrates no acute abnormality. PANCREAS: Pancreas demonstrates no acute abnormality. ADRENAL GLANDS: Adrenal glands demonstrate no acute abnormality. KIDNEYS, URETERS AND BLADDER: No stones in the kidneys  or ureters. No hydronephrosis. No perinephric or periureteral stranding. Urinary bladder is unremarkable. GI AND BOWEL: 1.2 cm radiopaque density within the gastric lumen likely representing ingested material such as medication. No abnormal bowel wall thickening or dilatation. The appendiceal base measures at the upper limits of normal and is fluid-filled. The distal appendix lumen is gas-filled with an appendicolith noted. No right lower quadrant fat stranding or inflammatory changes to suggest acute appendicitis. REPRODUCTIVE: Status post hysterectomy. No adnexal masses. PERITONEUM AND RETROPERITONEUM: No ascites or free air. Moderate atherosclerotic plaque of the abdominal aorta. LYMPH NODES: No lymphadenopathy. BONES AND SOFT TISSUES: Multilevel mild degenerative changes of the spine. No focal soft tissue abnormality. IMPRESSION: 1. No evidence of pulmonary embolism. 2. No acute intrathoracic or intraabdominal intrappelvic findings. 3. Persistent Vague mosaic attenuation of the lungs, most prominent in the bilateral lower lobes, possibly due to small vessel disease or atelectasis . 4. Multivessel coronary artery calcification and moderate aortic atherosclerosis. Electronically signed by: Morgane Naveau MD 02/25/2024 07:41 PM EDT RP Workstation: [id number]   CT ABDOMEN PELVIS W CONTRAST Result Date: 02/25/2024 EXAM: CTA CHEST PE WITHOUT AND WITH CONTRAST CT ABDOMEN AND PELVIS WITHOUT AND WITH CONTRAST 02/25/2024 07:08:40 PM TECHNIQUE: CTA of the chest was performed after the administration of 100 mL of iohexol  (OMNIPAQUE ) 350 MG/ML injection. Multiplanar reformatted images are provided for review. MIP images are provided for  review. CT of the abdomen and pelvis was performed without and with the administration of intravenous contrast. Automated exposure control, iterative reconstruction, and/or weight based adjustment of the mA/kV was utilized to reduce the radiation dose to as low as reasonably achievable. COMPARISON: CT angiography chest 08/10/2022, CT abdomen and pelvis 04/19/2022, MRI abdomen 04/20/2022. CLINICAL HISTORY: Pulmonary embolism (PE) suspected, high prob. Md note: 73 year old female with hypertension, hyperlipidemia, diabetes, who presented to the ER for an asthma attack and has since had 3 episodes lasting less than 30 seconds of either blank staring, spacing out or head slumped over. During the event, she is noninteractive. Afterwards, she has no recollection of the event. Currently Stroke Scale is 8, likely effort-related generalized symmetric weakness. No other deficits noted. Head CT negative for acute abnormalities. Not a thrombolytic candidate due to minimal concern for stroke. Her episodes are consistent with spells that she has had in the past. Per outpatient Neuro notes, patient has brief AMS spells with spacing out. Unclear if these are secondary to seizure vs stress reaction. She remains on Depakote  ER 500 mg qhs. FINDINGS: CHEST: PULMONARY ARTERIES: Pulmonary arteries are adequately opacified for evaluation. No intraluminal filling defect to suggest pulmonary embolism. Main pulmonary artery is normal in caliber. MEDIASTINUM: No mediastinal lymphadenopathy. Prominent heart size. 4-vessel coronary artery calcification. Moderate atherosclerotic plaque of the thoracic aorta. LUNGS AND PLEURA: Persisted vague mosaic attenuation of the lungs, most prominent in the bilateral lower lobes, which may be due to small vessel disease versus atelectasis . No focal consolidation or pulmonary edema. No pleural effusion or pneumothorax. SOFT TISSUES AND BONES: No acute bone or soft tissue abnormality. ABDOMEN AND PELVIS:  LIVER: Vague hypodense lesion along the falciform ligament likely focal fatty infiltration of the liver. Otherwise, no focal hepatic mass. The liver is normal in size. GALLBLADDER AND BILE DUCTS: Gallbladder is unremarkable. No biliary ductal dilatation. SPLEEN: Spleen demonstrates no acute abnormality. PANCREAS: Pancreas demonstrates no acute abnormality. ADRENAL GLANDS: Adrenal glands demonstrate no acute abnormality. KIDNEYS, URETERS AND BLADDER: No stones in the kidneys or ureters. No hydronephrosis. No perinephric or periureteral  stranding. Urinary bladder is unremarkable. GI AND BOWEL: 1.2 cm radiopaque density within the gastric lumen likely representing ingested material such as medication. No abnormal bowel wall thickening or dilatation. The appendiceal base measures at the upper limits of normal and is fluid-filled. The distal appendix lumen is gas-filled with an appendicolith noted. No right lower quadrant fat stranding or inflammatory changes to suggest acute appendicitis. REPRODUCTIVE: Status post hysterectomy. No adnexal masses. PERITONEUM AND RETROPERITONEUM: No ascites or free air. Moderate atherosclerotic plaque of the abdominal aorta. LYMPH NODES: No lymphadenopathy. BONES AND SOFT TISSUES: Multilevel mild degenerative changes of the spine. No focal soft tissue abnormality. IMPRESSION: 1. No evidence of pulmonary embolism. 2. No acute intrathoracic or intraabdominal intrappelvic findings. 3. Persistent Vague mosaic attenuation of the lungs, most prominent in the bilateral lower lobes, possibly due to small vessel disease or atelectasis . 4. Multivessel coronary artery calcification and moderate aortic atherosclerosis. Electronically signed by: Morgane Naveau MD 02/25/2024 07:41 PM EDT RP Workstation: [id number]   DG Chest Portable 1 View Result Date: 02/25/2024 CLINICAL DATA:  Shortness of breath and cough EXAM: PORTABLE CHEST 1 VIEW COMPARISON:  09/23/2022 FINDINGS: Low lung volumes. No focal  opacity, pleural effusion or pneumothorax. Borderline cardiac enlargement. IMPRESSION: Low lung volumes. Borderline cardiac enlargement. Electronically Signed   By: Luke Bun M.D.   On: 02/25/2024 18:01   CT HEAD CODE STROKE WO CONTRAST (LKW 0-4.5h, LVO 0-24h) Result Date: 02/25/2024 EXAM: CT HEAD WITHOUT 02/25/2024 04:51:50 PM TECHNIQUE: CT of the head was performed without the administration of intravenous contrast. Automated exposure control, iterative reconstruction, and/or weight based adjustment of the mA/kV was utilized to reduce the radiation dose to as low as reasonably achievable. COMPARISON: MRI head 05/05/2019. CLINICAL HISTORY: Neuro deficit, acute, stroke suspected. Code stroke; LKW 1635, RLE weakness; MD Jacqueline [phone]. FINDINGS: BRAIN AND VENTRICLES: No acute intracranial hemorrhage. No mass effect or midline shift. No extra-axial fluid collection. No evidence of acute infarct. No hydrocephalus. Hypoattenuation in the supratentorial white matter compatible with mild chronic microvascular ischemic changes. [place] Stroke Program Early CT (ASPECT) score: Ganglionic (caudate, IC, lentiform nucleus, insula, M1-M3): 7 Supraganglionic (M4-M6): 3 Total: 10 ORBITS: No acute abnormality. SINUSES AND MASTOIDS: No acute abnormality. SOFT TISSUES AND SKULL: No acute skull fracture. No acute soft tissue abnormality. IMPRESSION: 1. No acute intracranial abnormality. 2. Mild chronic microvascular ischemic changes. 3. ASPECTS 10. 4. Findings discussed with Dr. Jerrol at 5:02PM on 02/25/24. Electronically signed by: Donnice Mania MD 02/25/2024 05:04 PM EDT RP Workstation: [id number]     .Critical Care  Performed by: Neldon Hamp RAMAN, PA Authorized by: Neldon Hamp RAMAN, PA   Critical care provider statement:    Critical care time (minutes):  35   Critical care time was exclusive of:  Separately billable procedures and treating other patients and teaching time   Critical care was necessary to  treat or prevent imminent or life-threatening deterioration of the following conditions:  Respiratory failure (AMS)   Critical care was time spent personally by me on the following activities:  Development of treatment plan with patient or surrogate, review of old charts, re-evaluation of patient's condition, pulse oximetry, ordering and review of radiographic studies, ordering and review of laboratory studies, ordering and performing treatments and interventions, obtaining history from patient or surrogate, examination of patient and evaluation of patient's response to treatment   Care discussed with: admitting provider      Medications Ordered in the ED  valproate (DEPACON) 100 MG/ML injection (  Not Given  02/25/24 1823)  ipratropium-albuterol  (DUONEB) 0.5-2.5 (3) MG/3ML nebulizer solution 6 mL (6 mLs Nebulization Given 02/25/24 1602)  ipratropium-albuterol  (DUONEB) 0.5-2.5 (3) MG/3ML nebulizer solution 3 mL (3 mLs Nebulization Given 02/25/24 1621)  albuterol  (PROVENTIL ) (2.5 MG/3ML) 0.083% nebulizer solution 2.5 mg (2.5 mg Nebulization Given 02/25/24 1621)  methylPREDNISolone  sodium succinate (SOLU-MEDROL ) 125 mg/2 mL injection 125 mg (125 mg Intravenous Given 02/25/24 1709)  valproate (DEPACON) 1,000 mg in dextrose  5 % 50 mL IVPB (0 mg Intravenous Stopped 02/25/24 1959)  valproate (DEPACON) 100 MG/ML injection (1,000 mg  Given 02/25/24 1819)  oxyCODONE -acetaminophen  (PERCOCET/ROXICET) 5-325 MG per tablet 1 tablet (1 tablet Oral Given 02/25/24 1849)  iohexol  (OMNIPAQUE ) 350 MG/ML injection 100 mL (100 mLs Intravenous Contrast Given 02/25/24 1858)  albuterol  (PROVENTIL ) (2.5 MG/3ML) 0.083% nebulizer solution 5 mg (5 mg Nebulization Given 02/25/24 1912)    Clinical Course as of 02/25/24 2125  Mon Feb 25, 2024  1641 LKW 4:15 [WF]  1653 Code stroke called -- change in MS at 4:15pm MY phone number is [phone] [WF]  1732 Keppra 1g and then BID   [WF]  1800 1g depakote   [WF]    Clinical  Course User Index [WF] Neldon Hamp RAMAN, PA                                 Medical Decision Making Amount and/or Complexity of Data Reviewed Labs: ordered. Radiology: ordered.  Risk Prescription drug management. Decision regarding hospitalization.   This patient presents to the ED for concern of SOB, this involves a number of treatment options, and is a complaint that carries with it a high risk of complications and morbidity. A differential diagnosis was considered for the patient's symptoms which is discussed below:   The causes for shortness of breath include but are not limited to Cardiac (AHF, pericardial effusion and tamponade, arrhythmias, ischemia, etc) Respiratory (COPD, asthma, pneumonia, pneumothorax, primary pulmonary hypertension, PE/VQ mismatch) Hematological (anemia) Neuromuscular (ALS, Guillain-Barr, etc)    Co morbidities: Discussed in HPI   Brief History:  Patient with several days of cough and wheezing that began yesterday no hemoptysis endorses some chest pressure but no focal chest pain some nausea but no vomiting.  Denies any fevers or chills.   While interviewing patient she stopped responding to my questions and was staring towards the wall and then closed her eyes.  She responded high-pressure sternal rub by grimacing.  After that she was somewhat confused and would not answer questions and would only weakly follow commands such as squeezing hands bilaterally.  Code stroke called -- change in MS at 4:15pm   The differential diagnosis for AMS is extensive and includes, but is not limited to: drug overdose - opioids, alcohol, sedatives, antipsychotics, drug withdrawal, others; Metabolic: hypoxia, hypoglycemia, hyperglycemia, hypercalcemia, hypernatremia, hyponatremia, uremia, hepatic encephalopathy, hypothyroidism, hyperthyroidism, vitamin B12 or thiamine deficiency, carbon monoxide poisoning, Wilson's disease, Lactic acidosis, DKA/HHOS; Infectious:  meningitis, encephalitis, bacteremia/sepsis, urinary tract infection, pneumonia, neurosyphilis; Structural: Space-occupying lesion, (brain tumor, subdural hematoma, hydrocephalus,); Vascular: stroke, subarachnoid hemorrhage, coronary ischemia, hypertensive encephalopathy, CNS vasculitis, thrombotic thrombocytopenic purpura, disseminated intravascular coagulation, hyperviscosity; Psychiatric: Schizophrenia, depression; Other: Seizure, hypothermia, heat stroke, dementia     EMR reviewed including pt PMHx, past surgical history and past visits to ER.   See HPI for more details   Lab Tests:   I ordered and independently interpreted labs. Labs notable for BMP unremarkable FTs unremarkable CBC without leukocytosis or anemia lipase  normal.  Urinalysis shows greater than 1000 glucose however this is consistent patient being on empagliflozin .  No evidence of infection.  Coags normal, troponin undetectable BNP normal ammonia normal VBG without carbon dioxide retention UDS negative.  Imaging Studies:  Abnormal findings. I personally reviewed all imaging studies. Imaging notable for CAD evident.  No PE, abdomen pelvis without abnormal findings of acute etiology.   Cardiac Monitoring:  The patient was maintained on a cardiac monitor.  I personally viewed and interpreted the cardiac monitored which showed an underlying rhythm of: NSR EKG non-ischemic   Medicines ordered:  I ordered medication including IV Depakote , for seizure-like activity/altered mental status.  Patient treated from a asthma exacerbation standpoint with albuterol , ipratropium, Solu-Medrol  for wheezing Reevaluation of the patient after these medicines showed that the patient improved I have reviewed the patients home medicines and have made adjustments as needed   Critical Interventions:     Consults/Attending Physician   I requested consultation with Ana Cowing of neurology,  and discussed lab and imaging findings as well  as pertinent plan - they recommend: CTA head and neck -- if negative admit for TIA workup and EEG  I discussed this case with my attending physician who cosigned this note including patient's presenting symptoms, physical exam, and planned diagnostics and interventions. Attending physician stated agreement with plan or made changes to plan which were implemented.   Attending physician assessed patient at bedside.  Neurology recommended Depakote  load if continued episodes and patient did have another episode of staring off into space.  Loaded with Depakote .  Depakote  lab was obtained prior to loading dose.   Reevaluation:  After the interventions noted above I re-evaluated patient and found that they have : Improved   Social Determinants of Health:      Problem List / ED Course:  Patient here for asthma exacerbation seem to be quite symptomatic but did improve with standard treatment for asthma exacerbation however in the setting of this patient did have transient alteration of awareness that required rather severe sternal rub from myself she not have any eye deviation she did not have any seizure-like activity did not have any urinary incontinence or tongue biting or cheek biting.  She did seem somewhat confused afterwards and did not stop talking until several minutes after this episode.  She then was following commands but was weak diffusely normal neurologic exam glucose/CBG 122 EKG nonischemic overall reassuring workup code stroke was initiated by me and neurology evaluated and stopped after plain head CT recommend EEG and will write a note.  Patient has returned to her mental status baseline.   Dispostion:  After consideration of the diagnostic results and the patients response to treatment, I feel that the patent would benefit from admission       Final diagnoses:  Altered mental status, unspecified altered mental status type  Severe persistent asthma with acute exacerbation  Preferred [facility])    ED Discharge Orders     None          Neldon Hamp RAMAN, [place] 02/25/24 2125    Jacqueline Agent, MD 02/26/24 1445

## 2024-02-25 NOTE — ED Notes (Signed)
 CT called

## 2024-02-25 NOTE — ED Notes (Addendum)
 EP Lawsing states pt had acute onset RLE weakness and changed in mental status, when PA in pt room

## 2024-02-25 NOTE — ED Triage Notes (Signed)
 Pt c/o asthma flare up since yesterday. Rescue inhalers/ meds at home, no relief. Audibly wheezing during triage, speaking in complete sentences.

## 2024-02-25 NOTE — ED Notes (Signed)
 Stroke cart delay d/t cart use in a differnet patient room

## 2024-02-26 ENCOUNTER — Observation Stay (HOSPITAL_COMMUNITY)

## 2024-02-26 ENCOUNTER — Encounter (HOSPITAL_COMMUNITY): Payer: Self-pay

## 2024-02-26 DIAGNOSIS — E669 Obesity, unspecified: Secondary | ICD-10-CM

## 2024-02-26 DIAGNOSIS — J449 Chronic obstructive pulmonary disease, unspecified: Secondary | ICD-10-CM | POA: Diagnosis not present

## 2024-02-26 DIAGNOSIS — D539 Nutritional anemia, unspecified: Secondary | ICD-10-CM | POA: Diagnosis not present

## 2024-02-26 DIAGNOSIS — K219 Gastro-esophageal reflux disease without esophagitis: Secondary | ICD-10-CM | POA: Diagnosis not present

## 2024-02-26 DIAGNOSIS — R062 Wheezing: Secondary | ICD-10-CM | POA: Diagnosis not present

## 2024-02-26 DIAGNOSIS — R569 Unspecified convulsions: Secondary | ICD-10-CM

## 2024-02-26 DIAGNOSIS — N1831 Chronic kidney disease, stage 3a: Secondary | ICD-10-CM

## 2024-02-26 DIAGNOSIS — J4551 Severe persistent asthma with (acute) exacerbation: Secondary | ICD-10-CM | POA: Diagnosis present

## 2024-02-26 DIAGNOSIS — Z743 Need for continuous supervision: Secondary | ICD-10-CM | POA: Diagnosis not present

## 2024-02-26 DIAGNOSIS — E1165 Type 2 diabetes mellitus with hyperglycemia: Secondary | ICD-10-CM

## 2024-02-26 DIAGNOSIS — G3184 Mild cognitive impairment, so stated: Secondary | ICD-10-CM

## 2024-02-26 DIAGNOSIS — J4550 Severe persistent asthma, uncomplicated: Secondary | ICD-10-CM

## 2024-02-26 DIAGNOSIS — I2510 Atherosclerotic heart disease of native coronary artery without angina pectoris: Secondary | ICD-10-CM | POA: Diagnosis not present

## 2024-02-26 DIAGNOSIS — I959 Hypotension, unspecified: Secondary | ICD-10-CM | POA: Diagnosis not present

## 2024-02-26 DIAGNOSIS — Z86718 Personal history of other venous thrombosis and embolism: Secondary | ICD-10-CM

## 2024-02-26 DIAGNOSIS — R4182 Altered mental status, unspecified: Secondary | ICD-10-CM | POA: Diagnosis not present

## 2024-02-26 DIAGNOSIS — I6782 Cerebral ischemia: Secondary | ICD-10-CM | POA: Diagnosis not present

## 2024-02-26 DIAGNOSIS — I5032 Chronic diastolic (congestive) heart failure: Secondary | ICD-10-CM | POA: Diagnosis not present

## 2024-02-26 DIAGNOSIS — G40909 Epilepsy, unspecified, not intractable, without status epilepticus: Secondary | ICD-10-CM

## 2024-02-26 DIAGNOSIS — I10 Essential (primary) hypertension: Secondary | ICD-10-CM

## 2024-02-26 DIAGNOSIS — F418 Other specified anxiety disorders: Secondary | ICD-10-CM | POA: Diagnosis not present

## 2024-02-26 DIAGNOSIS — Z8673 Personal history of transient ischemic attack (TIA), and cerebral infarction without residual deficits: Secondary | ICD-10-CM | POA: Diagnosis not present

## 2024-02-26 DIAGNOSIS — R059 Cough, unspecified: Secondary | ICD-10-CM | POA: Diagnosis not present

## 2024-02-26 DIAGNOSIS — G319 Degenerative disease of nervous system, unspecified: Secondary | ICD-10-CM | POA: Diagnosis not present

## 2024-02-26 LAB — CBC
HCT: 38.4 % (ref 36.0–46.0)
Hemoglobin: 12.6 g/dL (ref 12.0–15.0)
MCH: 31.8 pg (ref 26.0–34.0)
MCHC: 32.8 g/dL (ref 30.0–36.0)
MCV: 97 fL (ref 80.0–100.0)
Platelets: 252 K/uL (ref 150–400)
RBC: 3.96 MIL/uL (ref 3.87–5.11)
RDW: 14 % (ref 11.5–15.5)
WBC: 11.7 K/uL — ABNORMAL HIGH (ref 4.0–10.5)
nRBC: 0 % (ref 0.0–0.2)

## 2024-02-26 LAB — COMPREHENSIVE METABOLIC PANEL WITH GFR
ALT: 17 U/L (ref 0–44)
AST: 21 U/L (ref 15–41)
Albumin: 3.7 g/dL (ref 3.5–5.0)
Alkaline Phosphatase: 48 U/L (ref 38–126)
Anion gap: 13 (ref 5–15)
BUN: 27 mg/dL — ABNORMAL HIGH (ref 8–23)
CO2: 23 mmol/L (ref 22–32)
Calcium: 8.9 mg/dL (ref 8.9–10.3)
Chloride: 103 mmol/L (ref 98–111)
Creatinine, Ser: 1.26 mg/dL — ABNORMAL HIGH (ref 0.44–1.00)
GFR, Estimated: 45 mL/min — ABNORMAL LOW (ref >60)
Glucose, Bld: 152 mg/dL — ABNORMAL HIGH (ref 70–99)
Potassium: 4 mmol/L (ref 3.5–5.1)
Sodium: 139 mmol/L (ref 135–145)
Total Bilirubin: 0.7 mg/dL (ref 0.0–1.2)
Total Protein: 6.9 g/dL (ref 6.5–8.1)

## 2024-02-26 LAB — GLUCOSE, CAPILLARY
Glucose-Capillary: 141 mg/dL — ABNORMAL HIGH (ref 70–99)
Glucose-Capillary: 122 mg/dL — ABNORMAL HIGH (ref 70–99)

## 2024-02-26 LAB — CBG MONITORING, ED: Glucose-Capillary: 142 mg/dL — ABNORMAL HIGH (ref 70–99)

## 2024-02-26 MED ORDER — ACETAMINOPHEN 325 MG PO TABS
650.0000 mg | ORAL_TABLET | Freq: Four times a day (QID) | ORAL | Status: DC | PRN
  Administered 2024-02-26 – 2024-03-01 (×3): 650 mg via ORAL
  Filled 2024-02-26 (×3): qty 2

## 2024-02-26 MED ORDER — GUAIFENESIN 100 MG/5ML PO LIQD
5.0000 mL | Freq: Once | ORAL | Status: AC
  Administered 2024-02-26: 5 mL via ORAL
  Filled 2024-02-26: qty 10

## 2024-02-26 MED ORDER — ACETAMINOPHEN 325 MG PO TABS
650.0000 mg | ORAL_TABLET | Freq: Once | ORAL | Status: AC
Start: 2024-02-26 — End: 2024-02-26
  Administered 2024-02-26: 650 mg via ORAL
  Filled 2024-02-26: qty 2

## 2024-02-26 MED ORDER — SODIUM CHLORIDE 0.9% FLUSH
3.0000 mL | Freq: Two times a day (BID) | INTRAVENOUS | Status: DC
  Administered 2024-02-26 – 2024-03-01 (×8): 3 mL via INTRAVENOUS

## 2024-02-26 MED ORDER — LORAZEPAM 2 MG/ML IJ SOLN: 2.0000 mg | INTRAMUSCULAR | Status: DC | PRN

## 2024-02-26 MED ORDER — POLYETHYLENE GLYCOL 3350 17 G PO PACK: 17.0000 g | PACK | Freq: Every day | ORAL | Status: DC | PRN

## 2024-02-26 MED ORDER — ENOXAPARIN SODIUM 40 MG/0.4ML IJ SOSY
40.0000 mg | PREFILLED_SYRINGE | INTRAMUSCULAR | Status: DC
  Administered 2024-02-26 – 2024-02-29 (×4): 40 mg via SUBCUTANEOUS
  Filled 2024-02-26 (×4): qty 0.4

## 2024-02-26 MED ORDER — GADOBUTROL 1 MMOL/ML IV SOLN
6.0000 mL | Freq: Once | INTRAVENOUS | Status: AC | PRN
  Administered 2024-02-26: 6 mL via INTRAVENOUS

## 2024-02-26 MED ORDER — ACETAMINOPHEN 650 MG RE SUPP: 650.0000 mg | Freq: Four times a day (QID) | RECTAL | Status: DC | PRN

## 2024-02-26 MED ORDER — INSULIN ASPART 100 UNIT/ML IJ SOLN
0.0000 [IU] | Freq: Three times a day (TID) | INTRAMUSCULAR | Status: DC
  Administered 2024-02-27 – 2024-02-29 (×6): 1 [IU] via SUBCUTANEOUS
  Administered 2024-02-29: 2 [IU] via SUBCUTANEOUS

## 2024-02-26 NOTE — H&P (Signed)
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 assistance. > Does have some tenderness to her shins possibly related to this accident but patient had not noticed it prior. - Continue to monitor  Hypertension - Continue home amlodipine , Coreg , losartan , Imdur   CKD 3A > Creatinine stable in the ED - Trend renal function and electrolytes  Diabetes - Continue home [name]  and metformin  - SSI in the setting of Solu-Medrol   GERD - Continue PPI  CAD > Status post multiple stenting, most recently in April 2024. - Continue home ASA, Coreg , losartan , Imdur , atorvastatin   History of pericarditis Pericardial effusion > Most recent echo in May of this year which showed trivial pericardial effusion. - Noted  Chronic diastolic CHF > As above, echo was done in May of this year.  Trivial effusion, EF 60-65%, G1 DD, normal RV function. - Continue home Coreg , losartan , Imdur  - Not on diuretic  Depression Anxiety Mild cognitive impairment - Continue home Namenda , trazodone, as needed Ativan   Anemia > Normal hemoglobin in the ED - Trend CBC  History of PE Obesity - Noted  DVT  prophylaxis: Lovenox  Code Status:   Full  Family Communication:  Updated at bedside  Disposition Plan:   Patient is from:  Home  Anticipated DC to:  Home  Anticipated DC date:  1 to 3 days  Anticipated DC barriers: None  Consults called:  Discussed with teleneurology in the ED. Admission status:  Previously accepted as observation, progressive  Severity of Illness: The appropriate patient status for this patient is OBSERVATION. Observation status is judged to be reasonable and necessary in order to provide the required intensity of service to ensure the patient's safety. The patient's presenting symptoms, physical exam findings, and initial radiographic and laboratory data in the context of their medical condition is felt to place them at decreased risk for further clinical deterioration. Furthermore, it is anticipated that the patient will be medically stable for discharge from the hospital within 2 midnights of admission.    Marsa KATHEE Scurry MD [facility]  How to contact the [facility] Attending or Consulting provider 7A - 7P or covering provider during after hours 7P -7A, for this patient?   Check the care team in [facility] and look for a) attending/consulting [facility] provider listed and b) the [facility] team listed Log into [email or web address] and use [place]'s universal password to access. If you do not have the password, please contact the hospital operator. Locate the [facility] provider you are looking for under [facility] and page to a number that you can be directly reached. If you still have difficulty reaching the provider, please page the [facility] (Director on Call) for the Hospitalists listed on amion for assistance.  02/26/2024, 3:12 PM

## 2024-02-26 NOTE — ED Notes (Signed)
 Report called to Lucie, CHARITY FUNDRAISER at [facility].

## 2024-02-26 NOTE — ED Notes (Signed)
 Pt is currently A&Ox4. Son at bedside. Provided pt with graham crackers, grits and decaf coffee.

## 2024-02-26 NOTE — Care Management Obs Status (Signed)
 MEDICARE OBSERVATION STATUS NOTIFICATION   Patient Details  Name: [name] MRN: [id number] Date of Birth: Apr 10, 1951   Medicare Observation Status Notification Given:  Yes  Verbally reviewed observation notice with Dorise Lindau telephonically at [phone].  Will deliver a copy to the patients room.   Benett Swoyer 02/26/2024, 4:03 PM

## 2024-02-26 NOTE — ED Notes (Signed)
 Pt woke up confused, did not know daughter in room and was very adamant about leaving. After a few minutes of redirection pt was able to verbalize her name and DOB. She remembered why she was in the hospital and recognized daughter. Ambulated to the Br. No neuro deficits noted. Dr. Geroldine made aware

## 2024-02-26 NOTE — Progress Notes (Signed)
 EEG complete - results pending

## 2024-02-26 NOTE — Progress Notes (Signed)
 Patient arrived to room 3W12, VS stable and free from pain. Daughter and son-in-law at bedside. Patient oriented to room and call bell in reach.

## 2024-02-26 NOTE — Plan of Care (Signed)
   Problem: Coping: Goal: Level of anxiety will decrease Outcome: Progressing

## 2024-02-26 NOTE — Procedures (Signed)
 Patient Name: [name]  MRN: [id number]  Epilepsy Attending: Arlin MALVA Krebs  Referring Physician/Provider: Seena Marsa NOVAK, MD  Date: 02/26/2024 Duration: 22.53 mins  Patient history: 73yo F with ams. EEG to evaluate for seizure  Level of alertness: Awake, asleep  AEDs during EEG study: VPA  Technical aspects: This EEG study was done with scalp electrodes positioned according to the 10-20 International system of electrode placement. Electrical activity was reviewed with band pass filter of 1-70Hz , sensitivity of 7 uV/mm, display speed of 28mm/sec with a 60Hz  notched filter applied as appropriate. EEG data were recorded continuously and digitally stored.  Video monitoring was available and reviewed as appropriate.  Description: The posterior dominant rhythm consists of 8-9Hz  activity of moderate voltage (25-35 uV) seen predominantly in posterior head regions, symmetric and reactive to eye opening and eye closing. Sleep was characterized by vertex waves, sleep spindles (12 to 14 Hz), maximal frontocentral region. Hyperventilation and photic stimulation were not performed.     IMPRESSION: This study is within normal limits. No seizures or epileptiform discharges were seen throughout the recording.  A normal interictal EEG does not exclude the diagnosis of epilepsy.   Redmond Whittley O Gem Conkle

## 2024-02-26 NOTE — ED Notes (Signed)
 Nataya w/ cl called for transport

## 2024-02-27 ENCOUNTER — Observation Stay (HOSPITAL_COMMUNITY)

## 2024-02-27 ENCOUNTER — Ambulatory Visit: Admitting: Internal Medicine

## 2024-02-27 DIAGNOSIS — R569 Unspecified convulsions: Secondary | ICD-10-CM | POA: Diagnosis not present

## 2024-02-27 DIAGNOSIS — R4182 Altered mental status, unspecified: Secondary | ICD-10-CM | POA: Diagnosis not present

## 2024-02-27 DIAGNOSIS — I5032 Chronic diastolic (congestive) heart failure: Secondary | ICD-10-CM | POA: Diagnosis not present

## 2024-02-27 DIAGNOSIS — J4530 Mild persistent asthma, uncomplicated: Secondary | ICD-10-CM

## 2024-02-27 DIAGNOSIS — J449 Chronic obstructive pulmonary disease, unspecified: Secondary | ICD-10-CM | POA: Diagnosis not present

## 2024-02-27 DIAGNOSIS — M542 Cervicalgia: Secondary | ICD-10-CM | POA: Diagnosis not present

## 2024-02-27 LAB — CBC
HCT: 36.2 % (ref 36.0–46.0)
Hemoglobin: 11.9 g/dL — ABNORMAL LOW (ref 12.0–15.0)
MCH: 31.5 pg (ref 26.0–34.0)
MCHC: 32.9 g/dL (ref 30.0–36.0)
MCV: 95.8 fL (ref 80.0–100.0)
Platelets: 222 K/uL (ref 150–400)
RBC: 3.78 MIL/uL — ABNORMAL LOW (ref 3.87–5.11)
RDW: 14.1 % (ref 11.5–15.5)
WBC: 9.7 K/uL (ref 4.0–10.5)
nRBC: 0 % (ref 0.0–0.2)

## 2024-02-27 LAB — COMPREHENSIVE METABOLIC PANEL WITH GFR
ALT: 17 U/L (ref 0–44)
AST: 18 U/L (ref 15–41)
Albumin: 3.3 g/dL — ABNORMAL LOW (ref 3.5–5.0)
Alkaline Phosphatase: 42 U/L (ref 38–126)
Anion gap: 15 (ref 5–15)
BUN: 25 mg/dL — ABNORMAL HIGH (ref 8–23)
CO2: 22 mmol/L (ref 22–32)
Calcium: 9.2 mg/dL (ref 8.9–10.3)
Chloride: 101 mmol/L (ref 98–111)
Creatinine, Ser: 1.13 mg/dL — ABNORMAL HIGH (ref 0.44–1.00)
GFR, Estimated: 51 mL/min — ABNORMAL LOW (ref >60)
Glucose, Bld: 100 mg/dL — ABNORMAL HIGH (ref 70–99)
Potassium: 3.9 mmol/L (ref 3.5–5.1)
Sodium: 138 mmol/L (ref 135–145)
Total Bilirubin: 0.4 mg/dL (ref 0.0–1.2)
Total Protein: 6.2 g/dL — ABNORMAL LOW (ref 6.5–8.1)

## 2024-02-27 LAB — GLUCOSE, CAPILLARY
Glucose-Capillary: 106 mg/dL — ABNORMAL HIGH (ref 70–99)
Glucose-Capillary: 131 mg/dL — ABNORMAL HIGH (ref 70–99)
Glucose-Capillary: 123 mg/dL — ABNORMAL HIGH (ref 70–99)
Glucose-Capillary: 162 mg/dL — ABNORMAL HIGH (ref 70–99)

## 2024-02-27 LAB — VALPROIC ACID LEVEL: Valproic Acid Lvl: 35 ug/mL — ABNORMAL LOW (ref 50–100)

## 2024-02-27 MED ORDER — DM-GUAIFENESIN ER 30-600 MG PO TB12
1.0000 | ORAL_TABLET | Freq: Two times a day (BID) | ORAL | Status: DC
  Administered 2024-02-27 – 2024-03-01 (×6): 1 via ORAL
  Filled 2024-02-27 (×7): qty 1

## 2024-02-27 MED ORDER — ALBUTEROL SULFATE (2.5 MG/3ML) 0.083% IN NEBU
2.5000 mg | INHALATION_SOLUTION | RESPIRATORY_TRACT | Status: DC | PRN
  Administered 2024-02-27: 2.5 mg via RESPIRATORY_TRACT
  Filled 2024-02-27: qty 3

## 2024-02-27 MED ORDER — FLUTICASONE PROPIONATE 50 MCG/ACT NA SUSP
1.0000 | Freq: Every day | NASAL | Status: DC
  Administered 2024-02-27 – 2024-03-01 (×4): 1 via NASAL
  Filled 2024-02-27: qty 16

## 2024-02-27 MED ORDER — PREDNISONE 20 MG PO TABS
40.0000 mg | ORAL_TABLET | Freq: Every day | ORAL | Status: AC
  Administered 2024-02-27 – 2024-02-29 (×3): 40 mg via ORAL
  Filled 2024-02-27 (×3): qty 2

## 2024-02-27 MED ORDER — LORATADINE 10 MG PO TABS
10.0000 mg | ORAL_TABLET | Freq: Every day | ORAL | Status: DC
  Administered 2024-02-27 – 2024-03-01 (×4): 10 mg via ORAL
  Filled 2024-02-27 (×4): qty 1

## 2024-02-27 MED ORDER — FAMOTIDINE 20 MG PO TABS
20.0000 mg | ORAL_TABLET | Freq: Every evening | ORAL | Status: DC | PRN
  Administered 2024-02-27: 20 mg via ORAL
  Filled 2024-02-27: qty 1

## 2024-02-27 NOTE — Progress Notes (Deleted)
 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     History of Present Illness  01/24/2016  Extended post hosp f/u ov/ transition of care/ /[name] re: dtca asthma/ vcd symb 160 and pred 5 x 10 mg daily  Chief Complaint  Patient presents with   Follow-up    Pt. recently got out the hopistal for an asthma attack, Pt. states her breathing remains the same, coughing with some yellow to white mucus,Has been needing to use Ventolin  more often  last doing well 2 months prior to OV   While on Symb 160/ gerd rx but not dosing ac as rec  Waking up prematurely x 2 months with cough/wheeze/ sob but never contacted me as per the instructions rec Plan A = Automatic = dulera  100 Take 2 puffs first thing in am and then another 2 puffs about 12 hours later.                                      Nexum 40 mg Take 30-60 min before first meal of the day and Pepcid  AC 20 mg  at bedtime Work on inhaler technique  Plan B = Backup Only use your albuterol  as a rescue medication Plan C = Crisis - only use your albuterol  nebulizer if you first try Plan B and it fails to help > ok to use the nebulizer up to every 4 hours but if start needing it regularly call for immediate appointment GERD diet  Take delsym  two tsp every 12 hours and use the flutter valve as much as possible and supplement if needed with  tramadol  50 mg up to 2 every 4 hours to suppress the urge to cough  Once you have eliminated the cough for 3 straight days try reducing the tramadol  first,  then the delsym  as tolerated.   Prednisone  5 mg x 8, then 6, then 4 then 2, then 1 daily x 2 days and off  please schedule a follow up office visit in 2 weeks, sooner if needed with all meds/ inhalers/ neb solutions in hand      03/15/2022  f/u ov/[name] re:  vcd/asthma  maint on performist/ bud/singulair / gerd rx   / did fine with surgery  Chief Complaint  Patient presents with   Follow-up    No new issues since LOV.  Dyspnea:  walk anywhere since L knee surgery but mostly w/in house and mb and back Cough: none  Sleeping: flat bed 2 pillows SABA use: none  02: none  Rec If any flare of resp symptoms go ahead and resume twice daily nebulizer treatments with perforomist  and budesonide  Late add: ok to leave off the pm dose of PPI if doing great    09/26/2022  f/u ov/[name] re: vcd  maint on prn saba / neb no longer using  / says has saba hfa but doesn't have it Chief Complaint  Patient presents with   Follow-up    Breathing doing well.  Patient had MI April 2024.  Dyspnea:  [employer] walking/ does use [employer] [facility]  Cough: none  Sleeping: flat bed/ 2 pillows no resp cc  SABA use: not needing  02: none   Rec Only use your albuterol  as a rescue medication Also  Ok to try albuterol  15 min before an activity (on alternating days)  that you know would usually make you short of breath Please schedule a follow up visit in 6 months but call sooner if needed to get started on symbicort  80   02/27/2024  f/u ov/[name] re: ***   maint on ***  No chief complaint on file.   Dyspnea:  *** Cough: *** Sleeping: *** resp cc  SABA use: *** 02: ***  Lung cancer screening :  ***    No obvious day to day or daytime variability or assoc excess/ purulent sputum or mucus plugs or hemoptysis or cp or chest tightness, subjective wheeze or overt sinus or hb symptoms.    Also denies any obvious fluctuation of symptoms with weather or environmental changes or other aggravating or alleviating factors except as outlined above   No unusual exposure hx or h/o childhood pna/ asthma or knowledge of premature birth.  Current Allergies, Complete Past Medical History, Past Surgical History, Family History, and Social History were reviewed in [employer] record.  ROS  The following are not active complaints unless bolded Hoarseness, sore throat, dysphagia, dental problems, itching, sneezing,  nasal congestion or discharge of excess mucus or purulent secretions, ear ache,   fever, chills, sweats, unintended wt loss or wt gain, classically pleuritic or exertional cp,  orthopnea pnd or arm/hand swelling  or leg swelling, presyncope, palpitations, abdominal pain, anorexia, nausea, vomiting, diarrhea  or change in bowel habits or change in bladder habits, change in stools or change in urine, dysuria, hematuria,  rash, arthralgias, visual complaints, headache, numbness, weakness or ataxia or problems with walking or coordination,  change in mood or  memory.        No outpatient medications have been marked as taking for the 02/27/24 encounter (Appointment) with [name], MD.           Objective:   Physical Exam  Wt  02/27/2024    ***  09/26/2022     144  03/15/2022    157  01/20/2022      165  06/28/2021      173  05/17/2021      165 03/15/2021    161  09/08/2020      153 01/12/2020      150  09/09/2019     147  03/12/2019   157  11/04/2018       157  02/23/2016   168 >  04/07/2016  168 > 05/08/2016    168 >  07/17/2016  162 > 09/18/2016    164  > 09/18/2017  160 > 12/28/2017 167  > 03/11/2018  164 > 03/20/2018   160 > 04/05/2018   171 > 04/18/2018  165    Vital signs reviewed  02/27/2024  - Note at rest 02 sats  ***% on ***   General appearance:    ***
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
## 2024-02-27 NOTE — Evaluation (Signed)
 Physical Therapy Evaluation Patient Details Name: [name] MRN: [id number] DOB: 08/30/1950 Today's Date: 02/27/2024  History of Present Illness  73 y.o. female presents to [facility] hospital on 02/25/2024 with SOB and AMS. Imaging negative for acute processes. PMH includes HTN, CKD III, GERD, CAD, CHF, depression/anxiety, mild cognitive impairment, seizures, neuropathy, anemia, COPD, asthma, PE.  Clinical Impression  Pt presents to PT mobilizing well, ambulating for household distances. Pt does report muscular fatigue when ambulating but attributes some of this to being in a hospital bed which was too short and did not allow her to stretch her legs, PT adjusts bed length prior to departure. PT notes wheezing when mobilizing but pt is able to freely converse and does not report significant DOE. PT is unable to obtain SpO2 when mobilizing at this time as the monitor present in room will not read on the pt's fingers due to long synthetic nails. PT encourages frequent mobilization with a gradual increase in duration to aide in a return to pt's baseline. PT recommends discharge gome when medically appropriate.        If plan is discharge home, recommend the following:     Can travel by private vehicle        Equipment Recommendations None recommended by PT  Recommendations for Other Services       Functional Status Assessment Patient has had a recent decline in their functional status and demonstrates the ability to make significant improvements in function in a reasonable and predictable amount of time.     Precautions / Restrictions Precautions Precautions: Other (comment) (seizure) Recall of Precautions/Restrictions: Intact Restrictions Weight Bearing Restrictions Per Provider Order: No      Mobility  Bed Mobility Overal bed mobility: Independent                  Transfers Overall transfer level: Independent Equipment used: None                     Ambulation/Gait Ambulation/Gait assistance: Modified independent (Device/Increase time) Gait Distance (Feet): 200 Feet Assistive device: None Gait Pattern/deviations: Step-through pattern Gait velocity: reduced Gait velocity interpretation: 1.31 - 2.62 ft/sec, indicative of limited community ambulator   General Gait Details: slowed step-through gait  Stairs Stairs:  (pt declines stair training at this time)          Wheelchair Mobility     Tilt Bed    Modified Rankin (Stroke Patients Only)       Balance Overall balance assessment: Mild deficits observed, not formally tested                                           Pertinent Vitals/Pain Pain Assessment Pain Assessment: No/denies pain    Home Living Family/patient expects to be discharged to:: Private residence Living Arrangements: Other relatives Available Help at Discharge: Family;Available 24 hours/day Type of Home: House Home Access: Stairs to enter Entrance Stairs-Rails: Doctor, General Practice of Steps: 3   Home Layout: One level Home Equipment: Agricultural Consultant (2 wheels);Cane - quad;Wheelchair - manual      Prior Function Prior Level of Function : Independent/Modified Independent;Driving             Mobility Comments: ambulatory without DME       Extremity/Trunk Assessment   Upper Extremity Assessment Upper Extremity Assessment: Overall WFL for tasks assessed    Lower  Extremity Assessment Lower Extremity Assessment: Overall WFL for tasks assessed    Cervical / Trunk Assessment Cervical / Trunk Assessment: Normal  Communication   Communication Communication: No apparent difficulties    Cognition Arousal: Alert Behavior During Therapy: WFL for tasks assessed/performed   PT - Cognitive impairments: No apparent impairments                       PT - Cognition Comments: documented history of mild intellectual disability, appropriate for this  session Following commands: Intact       Cueing Cueing Techniques: Verbal cues     General Comments General comments (skin integrity, edema, etc.): pt denies significant DOE when mobilizing, wheezing remains present but pt is able to converse when ambulating. Unable to obtain SpO2 when mobilizing due to pt's long synthetic nails.    Exercises     Assessment/Plan    PT Assessment Patient needs continued PT services  PT Problem List Decreased activity tolerance;Cardiopulmonary status limiting activity       PT Treatment Interventions Gait training;Stair training;Balance training;Neuromuscular re-education;Patient/family education    PT Goals (Current goals can be found in the Care Plan section)  Acute Rehab PT Goals Patient Stated Goal: to return to prior level of function PT Goal Formulation: With patient Time For Goal Achievement: 03/12/24 Potential to Achieve Goals: Good Additional Goals Additional Goal #1: Pt will score >19/24 on the DGI to indicate a reduced risk for falls Additional Goal #2: Pt will deny DOE when ambulating for >1000' to demonstrate improved tolerance for community ambulation    Frequency Min 1X/week     Co-evaluation               AM-PAC PT 6 Clicks Mobility  Outcome Measure Help needed turning from your back to your side while in a flat bed without using bedrails?: None Help needed moving from lying on your back to sitting on the side of a flat bed without using bedrails?: None Help needed moving to and from a bed to a chair (including a wheelchair)?: None Help needed standing up from a chair using your arms (e.g., wheelchair or bedside chair)?: None Help needed to walk in hospital room?: None Help needed climbing 3-5 steps with a railing? : A Little 6 Click Score: 23    End of Session   Activity Tolerance: Patient tolerated treatment well Patient left: in bed;with call bell/phone within reach;with family/visitor present Nurse  Communication: Mobility status PT Visit Diagnosis: Other abnormalities of gait and mobility (R26.89)    Time: 1721-1731 PT Time Calculation (min) (ACUTE ONLY): 10 min   Charges:   PT Evaluation $PT Eval Low Complexity: 1 Low   PT General Charges $$ ACUTE PT VISIT: 1 Visit         Bernardino JINNY Ruth, PT, DPT Acute Rehabilitation Office [phone]   Bernardino JINNY Ruth 02/27/2024, 5:41 PM

## 2024-02-27 NOTE — TOC Initial Note (Signed)
 Transition of Care [facility]) - Initial/Assessment Note    Patient Details  Name: [name] MRN: [id number] Date of Birth: [birth year]  [facility] CM/SW Contact:    Andrez JULIANNA George, RN Phone Number: 02/27/2024, 12:48 PM  Clinical Narrative:                 EMERSON BARRETTO is a 73 y.o. female with medical history significant of hypertension, CKD 3, diabetes, GERD, CAD status post stents, history of pericarditis and pericardial effusion, chronic diastolic CHF, depression, anxiety, mild cognitive impairment, seizures, neuropathy, anemia, COPD, asthma, obesity, PE presenting with shortness of breath.  Pt is from home with her daughter. Daughter works during the daytime but pts granddaughter is there when daughter is at work. Pt manages her own medications and usually drives self. Family can assist with transportation.  IP Care management following.  Expected Discharge Plan: Home/Self Care Barriers to Discharge: Continued Medical Work up   Patient Goals and CMS Choice            Expected Discharge Plan and Services   Discharge Planning Services: CM Consult                                          Prior Living Arrangements/Services   Lives with:: Adult Children Patient language and need for interpreter reviewed:: Yes Do you feel safe going back to the place where you live?: Yes        Care giver support system in place?: Yes (comment) Current home services: DME (Wheelchair/ cane/ walker) Criminal Activity/Legal Involvement Pertinent to Current Situation/Hospitalization: No - Comment as needed  Activities of Daily Living   ADL Screening (condition at time of admission) Independently performs ADLs?: Yes (appropriate for developmental age) Is the patient deaf or have difficulty hearing?: No Does the patient have difficulty seeing, even when wearing glasses/contacts?: No Does the patient have difficulty concentrating, remembering, or making decisions?:  No  Permission Sought/Granted                  Emotional Assessment Appearance:: Appears stated age Attitude/Demeanor/Rapport: Engaged Affect (typically observed): Accepting Orientation: : Oriented to Self, Oriented to Place, Oriented to  Time, Oriented to Situation   Psych Involvement: No (comment)  Admission diagnosis:  Severe persistent asthma with acute exacerbation (HCC) [J45.51] Altered mental status, unspecified altered mental status type [R41.82] AMS (altered mental status) [R41.82] Patient Active Problem List   Diagnosis Date Noted   Seizure-like activity (HCC) 02/26/2024   AMS (altered mental status) 02/25/2024   Pericarditis 08/14/2022   Pericardial effusion 08/14/2022   STEMI (ST elevation myocardial infarction) (HCC) 08/10/2022   Diarrhea 09/29/2021   COPD (chronic obstructive pulmonary disease) (HCC) 08/20/2021   Mild cognitive impairment 08/14/2021   Insomnia 08/01/2021   Localized edema 08/01/2021   Major depression, single episode 08/01/2021   Menopausal symptom 08/01/2021   Polyneuropathy due to type 2 diabetes mellitus (HCC) 08/01/2021   Vitamin D deficiency 08/01/2021   Obesity (BMI 30-39.9) 06/22/2021   Leukocytosis 06/22/2021   Hyponatremia 06/22/2021   Anemia of chronic disease 06/22/2021   Anxiety 06/19/2021   Biphasic stridor    Respiratory distress    Hypomagnesemia 05/13/2021   Abnormal swallowing 03/29/2021   Acute non-ST segment elevation myocardial infarction [facility]) 03/29/2021   Chronic diarrhea 03/29/2021   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   Abnormal mammogram  of left breast 12/13/2020   Memory loss 03/20/2019   Severe persistent asthma without complication (HCC) 04/04/2018   Chronic kidney disease, stage 3a (HCC) 03/15/2018   Dyspnea 08/12/2017   Macrocytic anemia 08/12/2017   Therapeutic drug monitoring 05/17/2017   Lung nodule 08/01/2016   Chronic diastolic CHF (congestive heart failure) (HCC)  07/17/2016   Right leg swelling 02/27/2016   Seizure disorder (HCC) 01/27/2016   Numbness 01/27/2016   CAD in native artery 01/20/2016   NSTEMI (non-ST elevated myocardial infarction) (HCC) 01/18/2016   Dyspnea on exertion    Upper airway cough syndrome with VCD 08/28/2014   Allergic rhinitis 07/22/2008   Vocal cord dysfunction 07/22/2008   DYSPNEA 07/22/2008   Type 2 diabetes mellitus with hyperglycemia (HCC) 07/21/2008   DIABETIC PERIPHERAL NEUROPATHY 07/21/2008   Depression with anxiety 07/21/2008   Essential hypertension 07/21/2008   GERD (gastroesophageal reflux disease) 07/21/2008   PULMONARY EMBOLISM, HX OF 07/21/2008   PCP:  Arloa Elsie SAUNDERS, MD Pharmacy:   [id number] ([place]), [place] - [address] [place] ([place]) [place] [address] Phone: [phone] Fax: [phone]     Social Drivers of Health (SDOH) Social History: SDOH Screenings   Food Insecurity: No Food Insecurity (02/26/2024)  Housing: Low Risk  (02/26/2024)  Transportation Needs: No Transportation Needs (02/26/2024)  Utilities: Not At Risk (02/26/2024)  Depression (PHQ2-9): Low Risk  (11/15/2022)  Recent Concern: Depression (PHQ2-9) - High Risk (09/05/2022)  Social Connections: Moderately Integrated (02/26/2024)  Tobacco Use: Medium Risk (02/25/2024)   SDOH Interventions:     Readmission Risk Interventions    08/14/2022   12:46 PM 08/22/2021   10:19 AM  Readmission Risk Prevention Plan  Transportation Screening Complete Complete  HRI or Home Care Consult Complete   Social Work Consult for Recovery Care Planning/Counseling Complete   Palliative Care Screening Not Applicable   Medication Review Oceanographer) Referral to Pharmacy Complete  PCP or Specialist appointment within 3-5 days of discharge  Complete  HRI or Home Care Consult  Complete  SW Recovery Care/Counseling Consult  Complete  Palliative Care Screening  Not Applicable  Skilled  Nursing Facility  Not Applicable

## 2024-02-28 ENCOUNTER — Observation Stay (HOSPITAL_COMMUNITY)

## 2024-02-28 DIAGNOSIS — R569 Unspecified convulsions: Secondary | ICD-10-CM

## 2024-02-28 DIAGNOSIS — R42 Dizziness and giddiness: Secondary | ICD-10-CM | POA: Diagnosis not present

## 2024-02-28 DIAGNOSIS — R4182 Altered mental status, unspecified: Secondary | ICD-10-CM | POA: Diagnosis not present

## 2024-02-28 DIAGNOSIS — J449 Chronic obstructive pulmonary disease, unspecified: Secondary | ICD-10-CM | POA: Diagnosis not present

## 2024-02-28 DIAGNOSIS — I5032 Chronic diastolic (congestive) heart failure: Secondary | ICD-10-CM | POA: Diagnosis not present

## 2024-02-28 DIAGNOSIS — N1831 Chronic kidney disease, stage 3a: Secondary | ICD-10-CM | POA: Diagnosis not present

## 2024-02-28 LAB — RESPIRATORY PANEL BY PCR

## 2024-02-28 LAB — GLUCOSE, CAPILLARY
Glucose-Capillary: 126 mg/dL — ABNORMAL HIGH (ref 70–99)
Glucose-Capillary: 137 mg/dL — ABNORMAL HIGH (ref 70–99)
Glucose-Capillary: 143 mg/dL — ABNORMAL HIGH (ref 70–99)
Glucose-Capillary: 177 mg/dL — ABNORMAL HIGH (ref 70–99)

## 2024-02-28 LAB — RESP PANEL BY RT-PCR (RSV, FLU A&B, COVID)  RVPGX2
Influenza A by PCR: NEGATIVE
Influenza B by PCR: NEGATIVE
Resp Syncytial Virus by PCR: NEGATIVE
SARS Coronavirus 2 by RT PCR: NEGATIVE

## 2024-02-28 MED ORDER — OXYCODONE HCL 5 MG PO TABS
5.0000 mg | ORAL_TABLET | Freq: Four times a day (QID) | ORAL | Status: DC | PRN
  Administered 2024-02-28 – 2024-03-01 (×5): 5 mg via ORAL
  Filled 2024-02-28 (×5): qty 1

## 2024-02-28 MED ORDER — DOXYCYCLINE HYCLATE 100 MG PO TABS
100.0000 mg | ORAL_TABLET | Freq: Two times a day (BID) | ORAL | Status: DC
  Administered 2024-02-28 – 2024-03-01 (×5): 100 mg via ORAL
  Filled 2024-02-28 (×5): qty 1

## 2024-02-28 MED ORDER — BENZONATATE 100 MG PO CAPS
200.0000 mg | ORAL_CAPSULE | Freq: Two times a day (BID) | ORAL | Status: DC
  Administered 2024-02-28 – 2024-03-01 (×5): 200 mg via ORAL
  Filled 2024-02-28 (×5): qty 2

## 2024-02-28 MED ORDER — ALBUTEROL SULFATE HFA 108 (90 BASE) MCG/ACT IN AERS
1.0000 | INHALATION_SPRAY | RESPIRATORY_TRACT | Status: DC | PRN
  Administered 2024-02-28: 2 via RESPIRATORY_TRACT
  Filled 2024-02-28: qty 6.7
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
## 2024-02-28 NOTE — Evaluation (Signed)
 Occupational Therapy Evaluation Patient Details Name: [name] MRN: [id number] DOB: 10-10-1950 Today's Date: 02/28/2024   History of Present Illness   73 y.o. female presents to [facility] hospital on 02/25/2024 with SOB and AMS. Imaging negative for acute processes. PMH includes HTN, CKD III, GERD, CAD, CHF, depression/anxiety, mild cognitive impairment, seizures, neuropathy, anemia, COPD, asthma, PE.     Clinical Impressions Pt greeted in supine, supportive son at bedside. AOX4. Very pleasant & participatory. PTA, pt was living with her daughter & adult granddaughter and was fully independent, + driving. Recently retired ~5 years ago; reports her husband passed away ~6 months ago and she has recently taken on finances (reports has been doing well with this). Functionally, she presents today near her baseline - mod I for all mobility and UB/LB ADLs. Mild wheezing observed, although VSS.   Given pt is near her functional baseline, no further acute nor post-acute OT needs identified at this time. Will sign-off.     If plan is discharge home, recommend the following:         Functional Status Assessment         Equipment Recommendations   None recommended by OT     Recommendations for Other Services         Precautions/Restrictions   Precautions Precautions: Other (comment) (seizure) Recall of Precautions/Restrictions: Intact Restrictions Weight Bearing Restrictions Per Provider Order: No     Mobility Bed Mobility Overal bed mobility: Independent                  Transfers Overall transfer level: Modified independent Equipment used: None                      Balance Overall balance assessment: Mild deficits observed, not formally tested                                         ADL either performed or assessed with clinical judgement   ADL Overall ADL's : Modified independent                                        General ADL Comments: stood for UB/LB bathing, grooming and LB dressing without difficulty/LOB     Vision Baseline Vision/History: 1 Wears glasses Ability to See in Adequate Light: 0 Adequate Patient Visual Report: No change from baseline Vision Assessment?: Wears glasses for reading     Perception         Praxis         Pertinent Vitals/Pain Pain Assessment Pain Assessment: No/denies pain     Extremity/Trunk Assessment Upper Extremity Assessment Upper Extremity Assessment: Overall WFL for tasks assessed   Lower Extremity Assessment Lower Extremity Assessment: Overall WFL for tasks assessed   Cervical / Trunk Assessment Cervical / Trunk Assessment: Normal   Communication Communication Communication: No apparent difficulties   Cognition Arousal: Alert Behavior During Therapy: WFL for tasks assessed/performed Cognition: No apparent impairments             OT - Cognition Comments: mild memory impairment, anticipate 2/2 age-related                 Following commands: Intact       Cueing  General Comments   Cueing Techniques: Verbal cues  some wheezing observed during session, VSS on RA   Exercises     Shoulder Instructions      Home Living Family/patient expects to be discharged to:: Private residence Living Arrangements: Other relatives Available Help at Discharge: Family;Available 24 hours/day Type of Home: House Home Access: Stairs to enter [employer] of Steps: 3 Entrance Stairs-Rails: Right;Left Home Layout: One level     Bathroom Shower/Tub: Tub/shower unit;Walk-in shower   Bathroom Toilet: Standard     Home Equipment: Agricultural Consultant (2 wheels);Cane - quad;Wheelchair - manual   Additional Comments: husband passed away ~6 months ago      Prior Functioning/Environment Prior Level of Function : Independent/Modified Independent;Driving             Mobility Comments: no AD PTA ADLs Comments: indep,  managing finances well since husband passed away (per pt), + driving, enjoys shopping and spending time with her sister    OT Problem List: Decreased activity tolerance;Impaired balance (sitting and/or standing)   OT Treatment/Interventions:        OT Goals(Current goals can be found in the care plan section)   Acute Rehab OT Goals Patient Stated Goal: return home   OT Frequency:       Co-evaluation              AM-PAC OT 6 Clicks Daily Activity     Outcome Measure Help from another person eating meals?: None Help from another person taking care of personal grooming?: None Help from another person toileting, which includes using toliet, bedpan, or urinal?: None Help from another person bathing (including washing, rinsing, drying)?: None Help from another person to put on and taking off regular upper body clothing?: None Help from another person to put on and taking off regular lower body clothing?: None 6 Click Score: 24   End of Session Nurse Communication: Mobility status  Activity Tolerance: Patient tolerated treatment well Patient left: in chair;with call bell/phone within reach;with nursing/sitter in room  OT Visit Diagnosis: Unsteadiness on feet (R26.81)                Time: 9262-9195 OT Time Calculation (min): 27 min Charges:  OT General Charges $OT Visit: 1 Visit OT Evaluation $OT Eval Low Complexity: 1 Low  Rikki BIRCH., MSOT, OTR/L Acute Rehabilitation Services [phone] Secure Chat Preferred  Rikki Milch 02/28/2024, 9:02 AM

## 2024-02-28 NOTE — Progress Notes (Signed)
 Carotid duplex  has been completed. Refer to [facility] under chart review to view preliminary results.   02/28/2024  12:53 PM Mikel Hardgrove, Ricka BIRCH

## 2024-02-28 NOTE — Plan of Care (Signed)
  Problem: Education: Goal: Knowledge of General Education information will improve Description: Including pain rating scale, medication(s)/side effects and non-pharmacologic comfort measures Outcome: Progressing   Problem: Health Behavior/Discharge Planning: Goal: Ability to manage health-related needs will improve Outcome: Progressing   Problem: Activity: Goal: Risk for activity intolerance will decrease Outcome: Progressing   Problem: Pain Managment: Goal: General experience of comfort will improve and/or be controlled Outcome: Progressing   Problem: Safety: Goal: Ability to remain free from injury will improve Outcome: Progressing   Problem: Skin Integrity: Goal: Risk for impaired skin integrity will decrease Outcome: Progressing   Problem: Safety: Goal: Verbalization of understanding the information provided will improve Outcome: Progressing

## 2024-02-29 DIAGNOSIS — R4182 Altered mental status, unspecified: Secondary | ICD-10-CM | POA: Diagnosis not present

## 2024-02-29 DIAGNOSIS — K219 Gastro-esophageal reflux disease without esophagitis: Secondary | ICD-10-CM | POA: Diagnosis not present

## 2024-02-29 DIAGNOSIS — N1831 Chronic kidney disease, stage 3a: Secondary | ICD-10-CM | POA: Diagnosis not present

## 2024-02-29 DIAGNOSIS — J449 Chronic obstructive pulmonary disease, unspecified: Secondary | ICD-10-CM | POA: Diagnosis not present

## 2024-02-29 DIAGNOSIS — I5032 Chronic diastolic (congestive) heart failure: Secondary | ICD-10-CM | POA: Diagnosis not present

## 2024-02-29 DIAGNOSIS — G40909 Epilepsy, unspecified, not intractable, without status epilepticus: Secondary | ICD-10-CM | POA: Diagnosis not present

## 2024-02-29 LAB — CBC WITH DIFFERENTIAL/PLATELET
Abs Immature Granulocytes: 0.04 K/uL (ref 0.00–0.07)
Basophils Absolute: 0.1 K/uL (ref 0.0–0.1)
Basophils Relative: 1 %
Eosinophils Absolute: 0 K/uL (ref 0.0–0.5)
Eosinophils Relative: 0 %
HCT: 32.3 % — ABNORMAL LOW (ref 36.0–46.0)
Hemoglobin: 10.7 g/dL — ABNORMAL LOW (ref 12.0–15.0)
Immature Granulocytes: 0 %
Lymphocytes Relative: 21 %
Lymphs Abs: 2.3 K/uL (ref 0.7–4.0)
MCH: 31.9 pg (ref 26.0–34.0)
MCHC: 33.1 g/dL (ref 30.0–36.0)
MCV: 96.4 fL (ref 80.0–100.0)
Monocytes Absolute: 1.1 K/uL — ABNORMAL HIGH (ref 0.1–1.0)
Monocytes Relative: 10 %
Neutro Abs: 7.3 K/uL (ref 1.7–7.7)
Neutrophils Relative %: 68 %
Platelets: 217 K/uL (ref 150–400)
RBC: 3.35 MIL/uL — ABNORMAL LOW (ref 3.87–5.11)
RDW: 14.1 % (ref 11.5–15.5)
WBC: 10.8 K/uL — ABNORMAL HIGH (ref 4.0–10.5)
nRBC: 0 % (ref 0.0–0.2)

## 2024-02-29 LAB — BASIC METABOLIC PANEL WITH GFR
Anion gap: 12 (ref 5–15)
BUN: 28 mg/dL — ABNORMAL HIGH (ref 8–23)
CO2: 24 mmol/L (ref 22–32)
Calcium: 8.7 mg/dL — ABNORMAL LOW (ref 8.9–10.3)
Chloride: 102 mmol/L (ref 98–111)
Creatinine, Ser: 1.12 mg/dL — ABNORMAL HIGH (ref 0.44–1.00)
GFR, Estimated: 51 mL/min — ABNORMAL LOW (ref >60)
Glucose, Bld: 153 mg/dL — ABNORMAL HIGH (ref 70–99)
Potassium: 4.1 mmol/L (ref 3.5–5.1)
Sodium: 138 mmol/L (ref 135–145)

## 2024-02-29 LAB — GLUCOSE, CAPILLARY
Glucose-Capillary: 102 mg/dL — ABNORMAL HIGH (ref 70–99)
Glucose-Capillary: 137 mg/dL — ABNORMAL HIGH (ref 70–99)
Glucose-Capillary: 198 mg/dL — ABNORMAL HIGH (ref 70–99)
Glucose-Capillary: 200 mg/dL — ABNORMAL HIGH (ref 70–99)

## 2024-02-29 MED ORDER — IPRATROPIUM-ALBUTEROL 0.5-2.5 (3) MG/3ML IN SOLN: 3.0000 mL | RESPIRATORY_TRACT | Status: DC | PRN
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
## 2024-03-01 ENCOUNTER — Other Ambulatory Visit (HOSPITAL_COMMUNITY): Payer: Self-pay

## 2024-03-01 DIAGNOSIS — I5032 Chronic diastolic (congestive) heart failure: Secondary | ICD-10-CM | POA: Diagnosis not present

## 2024-03-01 DIAGNOSIS — J449 Chronic obstructive pulmonary disease, unspecified: Secondary | ICD-10-CM | POA: Diagnosis not present

## 2024-03-01 DIAGNOSIS — K219 Gastro-esophageal reflux disease without esophagitis: Secondary | ICD-10-CM | POA: Diagnosis not present

## 2024-03-01 DIAGNOSIS — N1831 Chronic kidney disease, stage 3a: Secondary | ICD-10-CM | POA: Diagnosis not present

## 2024-03-01 DIAGNOSIS — G40909 Epilepsy, unspecified, not intractable, without status epilepticus: Secondary | ICD-10-CM | POA: Diagnosis not present

## 2024-03-01 DIAGNOSIS — I10 Essential (primary) hypertension: Secondary | ICD-10-CM | POA: Diagnosis not present

## 2024-03-01 DIAGNOSIS — R4182 Altered mental status, unspecified: Secondary | ICD-10-CM | POA: Diagnosis not present

## 2024-03-01 LAB — GLUCOSE, CAPILLARY
Glucose-Capillary: 109 mg/dL — ABNORMAL HIGH (ref 70–99)
Glucose-Capillary: 89 mg/dL (ref 70–99)

## 2024-03-01 MED ORDER — FLUTICASONE PROPIONATE 50 MCG/ACT NA SUSP
1.0000 | Freq: Every day | NASAL | 2 refills | Status: AC
  Filled 2024-03-01: qty 16, 30d supply, fill #0

## 2024-03-01 MED ORDER — OXYCODONE HCL 5 MG PO TABS
5.0000 mg | ORAL_TABLET | Freq: Four times a day (QID) | ORAL | 0 refills | Status: AC | PRN
  Filled 2024-03-01: qty 20, 5d supply, fill #0

## 2024-03-01 MED ORDER — PREDNISONE 10 MG PO TABS
ORAL_TABLET | ORAL | 0 refills | Status: AC
  Filled 2024-03-01: qty 30, 12d supply, fill #0

## 2024-03-01 MED ORDER — DOXYCYCLINE HYCLATE 100 MG PO TABS
100.0000 mg | ORAL_TABLET | Freq: Two times a day (BID) | ORAL | 0 refills | Status: AC
  Filled 2024-03-01: qty 8, 4d supply, fill #0

## 2024-03-01 MED ORDER — DM-GUAIFENESIN ER 30-600 MG PO TB12
1.0000 | ORAL_TABLET | Freq: Two times a day (BID) | ORAL | 1 refills | Status: DC
  Filled 2024-03-01: qty 20, 10d supply, fill #0

## 2024-03-01 MED ORDER — BENZONATATE 200 MG PO CAPS
200.0000 mg | ORAL_CAPSULE | Freq: Two times a day (BID) | ORAL | 0 refills | Status: DC
  Filled 2024-03-01: qty 20, 10d supply, fill #0

## 2024-03-01 MED ORDER — IPRATROPIUM-ALBUTEROL 0.5-2.5 (3) MG/3ML IN SOLN
3.0000 mL | RESPIRATORY_TRACT | 1 refills | Status: AC | PRN
  Filled 2024-03-01: qty 360, 20d supply, fill #0

## 2024-03-01 MED ORDER — COMPRESSOR/NEBULIZER MISC
1.0000 | 0 refills | Status: AC | PRN
  Filled 2024-03-01: qty 1, fill #0

## 2024-03-01 NOTE — Discharge Summary (Signed)
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 lobes, which may be due to small vessel disease versus atelectasis . No focal consolidation or pulmonary edema. No pleural effusion or pneumothorax. SOFT TISSUES AND BONES: No acute bone or soft tissue abnormality. ABDOMEN AND PELVIS: LIVER: Vague hypodense lesion along the falciform ligament likely focal fatty infiltration of the liver. Otherwise, no focal hepatic mass. The liver is normal in size. GALLBLADDER AND BILE DUCTS: Gallbladder is unremarkable. No biliary ductal dilatation. SPLEEN: Spleen demonstrates no acute abnormality. PANCREAS: Pancreas demonstrates no acute abnormality. ADRENAL GLANDS: Adrenal glands demonstrate no acute abnormality. KIDNEYS, URETERS AND BLADDER: No stones in the kidneys or ureters. No hydronephrosis. No perinephric or periureteral stranding. Urinary  bladder is unremarkable. GI AND BOWEL: 1.2 cm radiopaque density within the gastric lumen likely representing ingested material such as medication. No abnormal bowel wall thickening or dilatation.  The appendiceal base measures at the upper limits of normal and is fluid-filled. The distal appendix lumen is gas-filled with an appendicolith noted. No right lower quadrant fat stranding or inflammatory changes to suggest acute appendicitis. REPRODUCTIVE: Status post hysterectomy. No adnexal masses. PERITONEUM AND RETROPERITONEUM: No ascites or free air. Moderate atherosclerotic plaque of the abdominal aorta. LYMPH NODES: No lymphadenopathy. BONES AND SOFT TISSUES: Multilevel mild degenerative changes of the spine. No focal soft tissue abnormality. IMPRESSION: 1. No evidence of pulmonary embolism. 2. No acute intrathoracic or intraabdominal intrappelvic findings. 3. Persistent Vague mosaic attenuation of the lungs, most prominent in the bilateral lower lobes, possibly due to small vessel disease or atelectasis . 4. Multivessel coronary artery calcification and moderate aortic atherosclerosis. Electronically signed by: Morgane Naveau MD 02/25/2024 07:41 PM EDT RP Workstation: [id number]   DG Chest Portable 1 View Result Date: 02/25/2024 CLINICAL DATA:  Shortness of breath and cough EXAM: PORTABLE CHEST 1 VIEW COMPARISON:  09/23/2022 FINDINGS: Low lung volumes. No focal opacity, pleural effusion or pneumothorax. Borderline cardiac enlargement. IMPRESSION: Low lung volumes. Borderline cardiac enlargement. Electronically Signed   By: Luke Bun M.D.   On: 02/25/2024 18:01   CT HEAD CODE STROKE WO CONTRAST (LKW 0-4.5h, LVO 0-24h) Result Date: 02/25/2024 EXAM: CT HEAD WITHOUT 02/25/2024 04:51:50 PM TECHNIQUE: CT of the head was performed without the administration of intravenous contrast. Automated exposure control, iterative reconstruction, and/or weight based adjustment of the mA/kV was utilized to reduce the radiation dose to as low as reasonably achievable. COMPARISON: MRI head 05/05/2019. CLINICAL HISTORY: Neuro deficit, acute, stroke suspected. Code stroke; LKW 1635, RLE weakness; MD Jerrol  [phone]. FINDINGS: BRAIN AND VENTRICLES: No acute intracranial hemorrhage. No mass effect or midline shift. No extra-axial fluid collection. No evidence of acute infarct. No hydrocephalus. Hypoattenuation in the supratentorial white matter compatible with mild chronic microvascular ischemic changes. [place] Stroke Program Early CT (ASPECT) score: Ganglionic (caudate, IC, lentiform nucleus, insula, M1-M3): 7 Supraganglionic (M4-M6): 3 Total: 10 ORBITS: No acute abnormality. SINUSES AND MASTOIDS: No acute abnormality. SOFT TISSUES AND SKULL: No acute skull fracture. No acute soft tissue abnormality. IMPRESSION: 1. No acute intracranial abnormality. 2. Mild chronic microvascular ischemic changes. 3. ASPECTS 10. 4. Findings discussed with Dr. Jerrol at 5:02PM on 02/25/24. Electronically signed by: Donnice Mania MD 02/25/2024 05:04 PM EDT RP Workstation: [id number]    Microbiology: Results for orders placed or performed during the hospital encounter of 02/25/24  Respiratory (~20 pathogens) panel by PCR     Status: Abnormal   Collection Time: 02/28/24 11:54 AM   Specimen: Nasopharyngeal Swab; Respiratory  Result Value Ref Range Status   Adenovirus NOT DETECTED NOT DETECTED Final   Coronavirus 229E NOT DETECTED NOT DETECTED Final    Comment: (NOTE) The Coronavirus on the Respiratory Panel, DOES NOT test for the novel  Coronavirus (2019 nCoV)    Coronavirus HKU1 NOT DETECTED NOT DETECTED Final   Coronavirus NL63 NOT DETECTED NOT DETECTED Final   Coronavirus OC43 NOT DETECTED NOT DETECTED Final   Metapneumovirus NOT DETECTED NOT DETECTED Final   Rhinovirus / Enterovirus DETECTED (A) NOT DETECTED Final   Influenza A NOT DETECTED NOT DETECTED Final   Influenza B NOT DETECTED NOT DETECTED Final   Parainfluenza Virus 1 NOT DETECTED NOT DETECTED Final   Parainfluenza Virus 2 NOT DETECTED NOT DETECTED Final  Parainfluenza Virus 3 NOT DETECTED NOT DETECTED Final   Parainfluenza Virus 4 NOT DETECTED  NOT DETECTED Final   Respiratory Syncytial Virus NOT DETECTED NOT DETECTED Final   Bordetella pertussis NOT DETECTED NOT DETECTED Final   Bordetella Parapertussis NOT DETECTED NOT DETECTED Final   Chlamydophila pneumoniae NOT DETECTED NOT DETECTED Final   Mycoplasma pneumoniae NOT DETECTED NOT DETECTED Final    Comment: Performed at [facility], [address] [place], [place] [address]  Resp panel by RT-PCR (RSV, Flu A&B, Covid) Anterior Nasal Swab     Status: None   Collection Time: 02/28/24  4:00 PM   Specimen: Anterior Nasal Swab  Result Value Ref Range Status   SARS Coronavirus 2 by RT PCR NEGATIVE NEGATIVE Final   Influenza A by PCR NEGATIVE NEGATIVE Final   Influenza B by PCR NEGATIVE NEGATIVE Final    Comment: (NOTE) The Xpert Xpress SARS-CoV-2/FLU/RSV plus assay is intended as an aid in the diagnosis of influenza from Nasopharyngeal swab specimens and should not be used as a sole basis for treatment. Nasal washings and aspirates are unacceptable for Xpert Xpress SARS-CoV-2/FLU/RSV testing.  Fact Sheet for Patients: bloggercourse.com  Fact Sheet for Healthcare Providers: seriousbroker.it  This test is not yet approved or cleared by the [place]  FDA and has been authorized for detection and/or diagnosis of SARS-CoV-2 by FDA under an Emergency Use Authorization (EUA). This EUA will remain in effect (meaning this test can be used) for the duration of the COVID-19 declaration under Section 564(b)(1) of the Act, 21 U.S.C. section 360bbb-3(b)(1), unless the authorization is terminated or revoked.     Resp Syncytial Virus by PCR NEGATIVE NEGATIVE Final    Comment: (NOTE) Fact Sheet for Patients: bloggercourse.com  Fact Sheet for Healthcare Providers: seriousbroker.it  This test is not yet approved or cleared by the [place]  FDA and has been authorized for  detection and/or diagnosis of SARS-CoV-2 by FDA under an Emergency Use Authorization (EUA). This EUA will remain in effect (meaning this test can be used) for the duration of the COVID-19 declaration under Section 564(b)(1) of the Act, 21 U.S.C. section 360bbb-3(b)(1), unless the authorization is terminated or revoked.  Performed at [facility], [address] [place], [place] [address]     Labs: CBC: Recent Labs  Lab 02/25/24 1637 02/25/24 1720 02/26/24 1548 02/27/24 0304 02/29/24 0144  WBC 7.8 7.3 11.7* 9.7 10.8*  NEUTROABS  --  4.4  --   --  7.3  HGB 12.1 11.7* 12.6 11.9* 10.7*  HCT 36.5 35.2* 38.4 36.2 32.3*  MCV 96.6 96.7 97.0 95.8 96.4  PLT 209 242 252 222 217   Basic Metabolic Panel: Recent Labs  Lab 02/25/24 1636 02/25/24 1810 02/26/24 1548 02/27/24 0304 02/29/24 0144  NA 138 138 139 138 138  K 5.2* 5.1 4.0 3.9 4.1  CL  --  103 103 101 102  CO2  --  23 23 22 24   GLUCOSE  --  123* 152* 100* 153*  BUN  --  18 27* 25* 28*  CREATININE  --  1.08* 1.26* 1.13* 1.12*  CALCIUM   --  9.7 8.9 9.2 8.7*   Liver Function Tests: Recent Labs  Lab 02/25/24 1810 02/26/24 1548 02/27/24 0304  AST 38 21 18  ALT 15 17 17   ALKPHOS 59 48 42  BILITOT 0.3 0.7 0.4  PROT 7.2 6.9 6.2*  ALBUMIN  4.2 3.7 3.3*   CBG: Recent Labs  Lab 02/29/24 0651 02/29/24 1134  02/29/24 1712 02/29/24 2136 03/01/24 0636  GLUCAP 102* 137* 198* 200* 109*    Discharge time spent: 35 minutes.   Signed: Elgie Butter, MD [facility] 03/01/2024

## 2024-03-03 ENCOUNTER — Telehealth: Payer: Self-pay | Admitting: Neurology

## 2024-03-03 NOTE — Telephone Encounter (Signed)
 Pt called stating that she has been release from [facility] and they are requesting for Pt to be seen within next week

## 2024-03-04 NOTE — Telephone Encounter (Signed)
 Patient schedule appointment on 03/06/24 at 8:45am
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
## 2024-03-06 ENCOUNTER — Telehealth: Payer: Self-pay | Admitting: Neurology

## 2024-03-06 ENCOUNTER — Telehealth: Payer: Self-pay

## 2024-03-06 ENCOUNTER — Ambulatory Visit: Admitting: Neurology

## 2024-03-06 ENCOUNTER — Encounter: Payer: Self-pay | Admitting: Neurology

## 2024-03-06 ENCOUNTER — Telehealth: Payer: Self-pay | Admitting: *Deleted

## 2024-03-06 VITALS — BP 182/89 | HR 70

## 2024-03-06 DIAGNOSIS — R569 Unspecified convulsions: Secondary | ICD-10-CM | POA: Diagnosis not present

## 2024-03-06 DIAGNOSIS — F418 Other specified anxiety disorders: Secondary | ICD-10-CM

## 2024-03-06 DIAGNOSIS — G40909 Epilepsy, unspecified, not intractable, without status epilepticus: Secondary | ICD-10-CM

## 2024-03-06 DIAGNOSIS — G3184 Mild cognitive impairment, so stated: Secondary | ICD-10-CM

## 2024-03-06 NOTE — Telephone Encounter (Signed)
 Pt has called to r/s her EEG due to a conflict

## 2024-03-06 NOTE — Patient Instructions (Signed)
 Continue Depakote  for seizure prevention  Check 3 day EEG for seizure activity  Recommend no driving until seizure like spell free 6 months Call for any issues Follow up in 6 months

## 2024-03-06 NOTE — Telephone Encounter (Signed)
 SABRA

## 2024-03-06 NOTE — Telephone Encounter (Signed)
 72 hour AMB EEG referral form completed for [facility]. Form ready for Sarah NP signature.

## 2024-03-06 NOTE — Telephone Encounter (Signed)
-----   Message from Lauraine JINNY Born sent at 03/06/2024  9:49 AM EST ----- Ambulatory EEG paperwork please

## 2024-03-07 ENCOUNTER — Other Ambulatory Visit (HOSPITAL_COMMUNITY): Payer: Self-pay

## 2024-03-07 DIAGNOSIS — I5032 Chronic diastolic (congestive) heart failure: Secondary | ICD-10-CM | POA: Diagnosis not present

## 2024-03-07 DIAGNOSIS — J4540 Moderate persistent asthma, uncomplicated: Secondary | ICD-10-CM | POA: Diagnosis not present

## 2024-03-07 DIAGNOSIS — E1142 Type 2 diabetes mellitus with diabetic polyneuropathy: Secondary | ICD-10-CM | POA: Diagnosis not present

## 2024-03-07 DIAGNOSIS — D649 Anemia, unspecified: Secondary | ICD-10-CM | POA: Diagnosis not present

## 2024-03-07 DIAGNOSIS — E7800 Pure hypercholesterolemia, unspecified: Secondary | ICD-10-CM | POA: Diagnosis not present

## 2024-03-07 DIAGNOSIS — G40909 Epilepsy, unspecified, not intractable, without status epilepticus: Secondary | ICD-10-CM | POA: Diagnosis not present

## 2024-03-07 DIAGNOSIS — I10 Essential (primary) hypertension: Secondary | ICD-10-CM | POA: Diagnosis not present

## 2024-03-07 DIAGNOSIS — K219 Gastro-esophageal reflux disease without esophagitis: Secondary | ICD-10-CM | POA: Diagnosis not present

## 2024-03-11 NOTE — Progress Notes (Signed)
 Chart reviewed, agree above plan ?

## 2024-03-12 ENCOUNTER — Other Ambulatory Visit: Admitting: *Deleted

## 2024-03-13 ENCOUNTER — Other Ambulatory Visit: Admitting: *Deleted

## 2024-03-17 ENCOUNTER — Other Ambulatory Visit: Admitting: *Deleted

## 2024-03-17 DIAGNOSIS — R569 Unspecified convulsions: Secondary | ICD-10-CM | POA: Diagnosis not present

## 2024-03-18 DIAGNOSIS — R569 Unspecified convulsions: Secondary | ICD-10-CM | POA: Diagnosis not present

## 2024-03-19 DIAGNOSIS — R569 Unspecified convulsions: Secondary | ICD-10-CM | POA: Diagnosis not present

## 2024-03-20 DIAGNOSIS — R569 Unspecified convulsions: Secondary | ICD-10-CM | POA: Diagnosis not present

## 2024-03-23 ENCOUNTER — Encounter (HOSPITAL_COMMUNITY): Payer: Self-pay | Admitting: Emergency Medicine

## 2024-03-23 ENCOUNTER — Inpatient Hospital Stay (HOSPITAL_COMMUNITY)
Admission: EM | Admit: 2024-03-23 | Discharge: 2024-03-26 | DRG: 202 | Disposition: A | Discharge status: Home / Self Care | Attending: Internal Medicine | Admitting: Internal Medicine

## 2024-03-23 ENCOUNTER — Other Ambulatory Visit: Payer: Self-pay

## 2024-03-23 ENCOUNTER — Emergency Department (HOSPITAL_COMMUNITY)

## 2024-03-23 DIAGNOSIS — E1122 Type 2 diabetes mellitus with diabetic chronic kidney disease: Secondary | ICD-10-CM | POA: Diagnosis present

## 2024-03-23 DIAGNOSIS — Z888 Allergy status to other drugs, medicaments and biological substances status: Secondary | ICD-10-CM

## 2024-03-23 DIAGNOSIS — K1120 Sialoadenitis, unspecified: Secondary | ICD-10-CM | POA: Diagnosis present

## 2024-03-23 DIAGNOSIS — G40909 Epilepsy, unspecified, not intractable, without status epilepticus: Secondary | ICD-10-CM | POA: Diagnosis present

## 2024-03-23 DIAGNOSIS — K219 Gastro-esophageal reflux disease without esophagitis: Secondary | ICD-10-CM | POA: Diagnosis present

## 2024-03-23 DIAGNOSIS — J383 Other diseases of vocal cords: Secondary | ICD-10-CM | POA: Diagnosis present

## 2024-03-23 DIAGNOSIS — Z885 Allergy status to narcotic agent status: Secondary | ICD-10-CM

## 2024-03-23 DIAGNOSIS — Z6827 Body mass index (BMI) 27.0-27.9, adult: Secondary | ICD-10-CM

## 2024-03-23 DIAGNOSIS — Z8249 Family history of ischemic heart disease and other diseases of the circulatory system: Secondary | ICD-10-CM

## 2024-03-23 DIAGNOSIS — I5032 Chronic diastolic (congestive) heart failure: Secondary | ICD-10-CM | POA: Diagnosis present

## 2024-03-23 DIAGNOSIS — I10 Essential (primary) hypertension: Secondary | ICD-10-CM | POA: Diagnosis not present

## 2024-03-23 DIAGNOSIS — N1831 Chronic kidney disease, stage 3a: Secondary | ICD-10-CM | POA: Diagnosis present

## 2024-03-23 DIAGNOSIS — D631 Anemia in chronic kidney disease: Secondary | ICD-10-CM | POA: Diagnosis present

## 2024-03-23 DIAGNOSIS — D539 Nutritional anemia, unspecified: Secondary | ICD-10-CM | POA: Diagnosis present

## 2024-03-23 DIAGNOSIS — E669 Obesity, unspecified: Secondary | ICD-10-CM | POA: Diagnosis not present

## 2024-03-23 DIAGNOSIS — F0394 Unspecified dementia, unspecified severity, with anxiety: Secondary | ICD-10-CM | POA: Diagnosis present

## 2024-03-23 DIAGNOSIS — I130 Hypertensive heart and chronic kidney disease with heart failure and stage 1 through stage 4 chronic kidney disease, or unspecified chronic kidney disease: Secondary | ICD-10-CM | POA: Diagnosis present

## 2024-03-23 DIAGNOSIS — J9601 Acute respiratory failure with hypoxia: Secondary | ICD-10-CM

## 2024-03-23 DIAGNOSIS — J45901 Unspecified asthma with (acute) exacerbation: Secondary | ICD-10-CM | POA: Diagnosis not present

## 2024-03-23 DIAGNOSIS — E785 Hyperlipidemia, unspecified: Secondary | ICD-10-CM | POA: Diagnosis present

## 2024-03-23 DIAGNOSIS — G3184 Mild cognitive impairment, so stated: Secondary | ICD-10-CM | POA: Diagnosis present

## 2024-03-23 DIAGNOSIS — Z7984 Long term (current) use of oral hypoglycemic drugs: Secondary | ICD-10-CM

## 2024-03-23 DIAGNOSIS — F32A Depression, unspecified: Secondary | ICD-10-CM | POA: Diagnosis present

## 2024-03-23 DIAGNOSIS — Z90710 Acquired absence of both cervix and uterus: Secondary | ICD-10-CM

## 2024-03-23 DIAGNOSIS — Z86711 Personal history of pulmonary embolism: Secondary | ICD-10-CM

## 2024-03-23 DIAGNOSIS — I2510 Atherosclerotic heart disease of native coronary artery without angina pectoris: Secondary | ICD-10-CM | POA: Diagnosis not present

## 2024-03-23 DIAGNOSIS — Z833 Family history of diabetes mellitus: Secondary | ICD-10-CM

## 2024-03-23 DIAGNOSIS — I252 Old myocardial infarction: Secondary | ICD-10-CM

## 2024-03-23 DIAGNOSIS — Z7982 Long term (current) use of aspirin: Secondary | ICD-10-CM

## 2024-03-23 DIAGNOSIS — E1140 Type 2 diabetes mellitus with diabetic neuropathy, unspecified: Secondary | ICD-10-CM | POA: Diagnosis present

## 2024-03-23 DIAGNOSIS — R0602 Shortness of breath: Secondary | ICD-10-CM | POA: Diagnosis not present

## 2024-03-23 DIAGNOSIS — Z79899 Other long term (current) drug therapy: Secondary | ICD-10-CM

## 2024-03-23 DIAGNOSIS — T380X5A Adverse effect of glucocorticoids and synthetic analogues, initial encounter: Secondary | ICD-10-CM | POA: Diagnosis present

## 2024-03-23 DIAGNOSIS — E1165 Type 2 diabetes mellitus with hyperglycemia: Secondary | ICD-10-CM | POA: Diagnosis present

## 2024-03-23 DIAGNOSIS — F329 Major depressive disorder, single episode, unspecified: Secondary | ICD-10-CM | POA: Diagnosis not present

## 2024-03-23 DIAGNOSIS — F419 Anxiety disorder, unspecified: Secondary | ICD-10-CM | POA: Diagnosis present

## 2024-03-23 DIAGNOSIS — J4541 Moderate persistent asthma with (acute) exacerbation: Principal | ICD-10-CM | POA: Diagnosis present

## 2024-03-23 DIAGNOSIS — F0393 Unspecified dementia, unspecified severity, with mood disturbance: Secondary | ICD-10-CM | POA: Diagnosis present

## 2024-03-23 DIAGNOSIS — Z825 Family history of asthma and other chronic lower respiratory diseases: Secondary | ICD-10-CM

## 2024-03-23 DIAGNOSIS — N1832 Chronic kidney disease, stage 3b: Secondary | ICD-10-CM | POA: Diagnosis present

## 2024-03-23 DIAGNOSIS — E1142 Type 2 diabetes mellitus with diabetic polyneuropathy: Secondary | ICD-10-CM | POA: Diagnosis present

## 2024-03-23 DIAGNOSIS — Z87891 Personal history of nicotine dependence: Secondary | ICD-10-CM

## 2024-03-23 DIAGNOSIS — Z1152 Encounter for screening for COVID-19: Secondary | ICD-10-CM

## 2024-03-23 DIAGNOSIS — Z955 Presence of coronary angioplasty implant and graft: Secondary | ICD-10-CM

## 2024-03-23 LAB — CBC WITH DIFFERENTIAL/PLATELET
Abs Immature Granulocytes: 0.03 K/uL (ref 0.00–0.07)
Basophils Absolute: 0.1 K/uL (ref 0.0–0.1)
Basophils Relative: 1 %
Eosinophils Absolute: 0.2 K/uL (ref 0.0–0.5)
Eosinophils Relative: 2 %
HCT: 35.3 % — ABNORMAL LOW (ref 36.0–46.0)
Hemoglobin: 11.4 g/dL — ABNORMAL LOW (ref 12.0–15.0)
Immature Granulocytes: 0 %
Lymphocytes Relative: 30 %
Lymphs Abs: 2.4 K/uL (ref 0.7–4.0)
MCH: 32 pg (ref 26.0–34.0)
MCHC: 32.3 g/dL (ref 30.0–36.0)
MCV: 99.2 fL (ref 80.0–100.0)
Monocytes Absolute: 1 K/uL (ref 0.1–1.0)
Monocytes Relative: 13 %
Neutro Abs: 4.3 K/uL (ref 1.7–7.7)
Neutrophils Relative %: 54 %
Platelets: 188 K/uL (ref 150–400)
RBC: 3.56 MIL/uL — ABNORMAL LOW (ref 3.87–5.11)
RDW: 14.5 % (ref 11.5–15.5)
WBC: 8 K/uL (ref 4.0–10.5)
nRBC: 0 % (ref 0.0–0.2)

## 2024-03-23 LAB — COMPREHENSIVE METABOLIC PANEL WITH GFR
ALT: 14 U/L (ref 0–44)
AST: 23 U/L (ref 15–41)
Albumin: 3.4 g/dL — ABNORMAL LOW (ref 3.5–5.0)
Alkaline Phosphatase: 52 U/L (ref 38–126)
Anion gap: 15 (ref 5–15)
BUN: 19 mg/dL (ref 8–23)
CO2: 24 mmol/L (ref 22–32)
Calcium: 8.7 mg/dL — ABNORMAL LOW (ref 8.9–10.3)
Chloride: 101 mmol/L (ref 98–111)
Creatinine, Ser: 1.21 mg/dL — ABNORMAL HIGH (ref 0.44–1.00)
GFR, Estimated: 47 mL/min — ABNORMAL LOW (ref >60)
Glucose, Bld: 210 mg/dL — ABNORMAL HIGH (ref 70–99)
Potassium: 3.7 mmol/L (ref 3.5–5.1)
Sodium: 140 mmol/L (ref 135–145)
Total Bilirubin: 1 mg/dL (ref 0.0–1.2)
Total Protein: 6.3 g/dL — ABNORMAL LOW (ref 6.5–8.1)

## 2024-03-23 LAB — BRAIN NATRIURETIC PEPTIDE: B Natriuretic Peptide: 24.6 pg/mL (ref 0.0–100.0)

## 2024-03-23 LAB — RESP PANEL BY RT-PCR (RSV, FLU A&B, COVID)  RVPGX2
Influenza A by PCR: NEGATIVE
Influenza B by PCR: NEGATIVE
Resp Syncytial Virus by PCR: NEGATIVE
SARS Coronavirus 2 by RT PCR: NEGATIVE

## 2024-03-23 LAB — GLUCOSE, CAPILLARY
Glucose-Capillary: 282 mg/dL — ABNORMAL HIGH (ref 70–99)
Glucose-Capillary: 330 mg/dL — ABNORMAL HIGH (ref 70–99)
Glucose-Capillary: 190 mg/dL — ABNORMAL HIGH (ref 70–99)

## 2024-03-23 MED ORDER — ACETAMINOPHEN 325 MG PO TABS
650.0000 mg | ORAL_TABLET | Freq: Four times a day (QID) | ORAL | Status: DC | PRN
  Administered 2024-03-24: 650 mg via ORAL
  Filled 2024-03-23: qty 2

## 2024-03-23 MED ORDER — ISOSORBIDE MONONITRATE ER 30 MG PO TB24
30.0000 mg | ORAL_TABLET | Freq: Every day | ORAL | Status: DC
  Administered 2024-03-24 – 2024-03-26 (×3): 30 mg via ORAL
  Filled 2024-03-23 (×3): qty 1

## 2024-03-23 MED ORDER — SODIUM CHLORIDE 0.9% FLUSH
3.0000 mL | Freq: Two times a day (BID) | INTRAVENOUS | Status: DC
  Administered 2024-03-23 – 2024-03-25 (×5): 3 mL via INTRAVENOUS

## 2024-03-23 MED ORDER — ALBUTEROL SULFATE (2.5 MG/3ML) 0.083% IN NEBU
10.0000 mg/h | INHALATION_SOLUTION | Freq: Once | RESPIRATORY_TRACT | Status: AC
  Administered 2024-03-23: 10 mg/h via RESPIRATORY_TRACT
  Filled 2024-03-23: qty 12

## 2024-03-23 MED ORDER — LATANOPROST 0.005 % OP SOLN
1.0000 [drp] | Freq: Every day | OPHTHALMIC | Status: DC
  Administered 2024-03-23 – 2024-03-25 (×3): 1 [drp] via OPHTHALMIC
  Filled 2024-03-23: qty 2.5

## 2024-03-23 MED ORDER — MEMANTINE HCL 10 MG PO TABS
10.0000 mg | ORAL_TABLET | Freq: Two times a day (BID) | ORAL | Status: DC
  Administered 2024-03-23 – 2024-03-26 (×6): 10 mg via ORAL
  Filled 2024-03-23 (×6): qty 1

## 2024-03-23 MED ORDER — DICLOFENAC SODIUM 1 % EX GEL
2.0000 g | Freq: Four times a day (QID) | CUTANEOUS | Status: DC
  Administered 2024-03-23 – 2024-03-24 (×5): 2 g via TOPICAL
  Filled 2024-03-23: qty 100

## 2024-03-23 MED ORDER — ACETAMINOPHEN 650 MG RE SUPP: 650.0000 mg | Freq: Four times a day (QID) | RECTAL | Status: DC | PRN

## 2024-03-23 MED ORDER — ATORVASTATIN CALCIUM 80 MG PO TABS
80.0000 mg | ORAL_TABLET | Freq: Every day | ORAL | Status: DC
  Administered 2024-03-24 – 2024-03-26 (×3): 80 mg via ORAL
  Filled 2024-03-23 (×3): qty 1

## 2024-03-23 MED ORDER — IPRATROPIUM-ALBUTEROL 0.5-2.5 (3) MG/3ML IN SOLN
3.0000 mL | Freq: Three times a day (TID) | RESPIRATORY_TRACT | Status: DC
  Administered 2024-03-24 – 2024-03-25 (×4): 3 mL via RESPIRATORY_TRACT
  Filled 2024-03-23 (×4): qty 3

## 2024-03-23 MED ORDER — ASPIRIN 81 MG PO CHEW
81.0000 mg | CHEWABLE_TABLET | Freq: Every day | ORAL | Status: DC
  Administered 2024-03-24 – 2024-03-26 (×3): 81 mg via ORAL
  Filled 2024-03-23 (×3): qty 1

## 2024-03-23 MED ORDER — AMLODIPINE BESYLATE 10 MG PO TABS
10.0000 mg | ORAL_TABLET | Freq: Every day | ORAL | Status: DC
  Administered 2024-03-24 – 2024-03-26 (×3): 10 mg via ORAL
  Filled 2024-03-23 (×3): qty 1

## 2024-03-23 MED ORDER — PANTOPRAZOLE SODIUM 40 MG PO TBEC: 40.0000 mg | DELAYED_RELEASE_TABLET | Freq: Every day | ORAL | Status: DC

## 2024-03-23 MED ORDER — HYDROCODONE-ACETAMINOPHEN 5-325 MG PO TABS
1.0000 | ORAL_TABLET | Freq: Once | ORAL | Status: AC
  Administered 2024-03-23: 1 via ORAL
  Filled 2024-03-23: qty 1

## 2024-03-23 MED ORDER — MAGNESIUM SULFATE 2 GM/50ML IV SOLN
2.0000 g | Freq: Once | INTRAVENOUS | Status: AC
  Administered 2024-03-23: 2 g via INTRAVENOUS
  Filled 2024-03-23: qty 50

## 2024-03-23 MED ORDER — ENOXAPARIN SODIUM 40 MG/0.4ML IJ SOSY
40.0000 mg | PREFILLED_SYRINGE | INTRAMUSCULAR | Status: DC
  Administered 2024-03-23 – 2024-03-25 (×3): 40 mg via SUBCUTANEOUS
  Filled 2024-03-23 (×3): qty 0.4

## 2024-03-23 MED ORDER — KETOROLAC TROMETHAMINE 15 MG/ML IJ SOLN
7.5000 mg | Freq: Once | INTRAMUSCULAR | Status: DC | PRN
  Filled 2024-03-23: qty 1

## 2024-03-23 MED ORDER — TRAZODONE HCL 50 MG PO TABS
100.0000 mg | ORAL_TABLET | Freq: Every evening | ORAL | Status: DC | PRN
  Administered 2024-03-23 – 2024-03-24 (×2): 100 mg via ORAL
  Filled 2024-03-23 (×2): qty 2

## 2024-03-23 MED ORDER — CARVEDILOL 25 MG PO TABS
25.0000 mg | ORAL_TABLET | Freq: Two times a day (BID) | ORAL | Status: DC
  Administered 2024-03-23 – 2024-03-26 (×6): 25 mg via ORAL
  Filled 2024-03-23 (×6): qty 1

## 2024-03-23 MED ORDER — HYDROCOD POLI-CHLORPHE POLI ER 10-8 MG/5ML PO SUER
5.0000 mL | Freq: Two times a day (BID) | ORAL | Status: DC | PRN
  Administered 2024-03-24 – 2024-03-26 (×5): 5 mL via ORAL
  Filled 2024-03-23 (×5): qty 5

## 2024-03-23 MED ORDER — ALPRAZOLAM 0.5 MG PO TABS
0.5000 mg | ORAL_TABLET | Freq: Three times a day (TID) | ORAL | Status: DC | PRN
  Administered 2024-03-24: 0.5 mg via ORAL
  Filled 2024-03-23: qty 1

## 2024-03-23 MED ORDER — POLYETHYLENE GLYCOL 3350 17 G PO PACK: 17.0000 g | PACK | Freq: Every day | ORAL | Status: DC | PRN

## 2024-03-23 MED ORDER — HYDROCODONE-ACETAMINOPHEN 5-325 MG PO TABS
1.0000 | ORAL_TABLET | ORAL | Status: AC | PRN
Start: 2024-03-23 — End: 2024-03-24
  Administered 2024-03-23 – 2024-03-24 (×2): 1 via ORAL
  Filled 2024-03-23 (×2): qty 1

## 2024-03-23 MED ORDER — MONTELUKAST SODIUM 10 MG PO TABS
10.0000 mg | ORAL_TABLET | Freq: Every day | ORAL | Status: DC
  Administered 2024-03-23 – 2024-03-25 (×3): 10 mg via ORAL
  Filled 2024-03-23 (×3): qty 1

## 2024-03-23 MED ORDER — IPRATROPIUM-ALBUTEROL 0.5-2.5 (3) MG/3ML IN SOLN
3.0000 mL | Freq: Four times a day (QID) | RESPIRATORY_TRACT | Status: DC
  Administered 2024-03-23 (×2): 3 mL via RESPIRATORY_TRACT
  Filled 2024-03-23 (×2): qty 3

## 2024-03-23 MED ORDER — DIVALPROEX SODIUM ER 500 MG PO TB24
500.0000 mg | ORAL_TABLET | Freq: Every day | ORAL | Status: DC
  Administered 2024-03-23 – 2024-03-25 (×3): 500 mg via ORAL
  Filled 2024-03-23 (×5): qty 1

## 2024-03-23 MED ORDER — INSULIN ASPART 100 UNIT/ML IJ SOLN
0.0000 [IU] | Freq: Three times a day (TID) | INTRAMUSCULAR | Status: DC
  Administered 2024-03-23: 7 [IU] via SUBCUTANEOUS
  Administered 2024-03-24: 2 [IU] via SUBCUTANEOUS
  Administered 2024-03-24: 1 [IU] via SUBCUTANEOUS
  Administered 2024-03-24 – 2024-03-25 (×2): 2 [IU] via SUBCUTANEOUS
  Administered 2024-03-25: 3 [IU] via SUBCUTANEOUS
  Filled 2024-03-23: qty 3
  Filled 2024-03-23: qty 2
  Filled 2024-03-23: qty 7
  Filled 2024-03-23: qty 2
  Filled 2024-03-23 (×2): qty 1

## 2024-03-23 MED ORDER — FAMOTIDINE 20 MG PO TABS: 20.0000 mg | ORAL_TABLET | Freq: Every evening | ORAL | Status: DC | PRN

## 2024-03-23 MED ORDER — ALBUTEROL SULFATE (2.5 MG/3ML) 0.083% IN NEBU
10.0000 mg | INHALATION_SOLUTION | Freq: Once | RESPIRATORY_TRACT | Status: AC
  Administered 2024-03-23: 10 mg via RESPIRATORY_TRACT
  Filled 2024-03-23: qty 12

## 2024-03-23 MED ORDER — PANTOPRAZOLE SODIUM 40 MG PO TBEC
40.0000 mg | DELAYED_RELEASE_TABLET | Freq: Two times a day (BID) | ORAL | Status: DC
  Administered 2024-03-23 – 2024-03-26 (×6): 40 mg via ORAL
  Filled 2024-03-23 (×6): qty 1

## 2024-03-23 MED ORDER — PREDNISONE 20 MG PO TABS
50.0000 mg | ORAL_TABLET | Freq: Every day | ORAL | Status: DC
  Administered 2024-03-24: 50 mg via ORAL
  Filled 2024-03-23: qty 1

## 2024-03-23 MED ORDER — BUDESONIDE 0.5 MG/2ML IN SUSP
0.5000 mg | Freq: Once | RESPIRATORY_TRACT | Status: AC
  Administered 2024-03-23: 0.5 mg via RESPIRATORY_TRACT
  Filled 2024-03-23: qty 2

## 2024-03-23 MED ORDER — LORATADINE 10 MG PO TABS
10.0000 mg | ORAL_TABLET | Freq: Every day | ORAL | Status: DC
  Administered 2024-03-24 – 2024-03-26 (×3): 10 mg via ORAL
  Filled 2024-03-23 (×3): qty 1

## 2024-03-23 MED ORDER — MORPHINE SULFATE (PF) 4 MG/ML IV SOLN
4.0000 mg | Freq: Once | INTRAVENOUS | Status: AC
  Administered 2024-03-23: 4 mg via INTRAVENOUS
  Filled 2024-03-23: qty 1

## 2024-03-23 MED ORDER — LOSARTAN POTASSIUM 50 MG PO TABS
25.0000 mg | ORAL_TABLET | Freq: Every day | ORAL | Status: DC
  Administered 2024-03-24 – 2024-03-26 (×3): 25 mg via ORAL
  Filled 2024-03-23 (×3): qty 1

## 2024-03-23 MED ORDER — ALBUTEROL SULFATE (2.5 MG/3ML) 0.083% IN NEBU
2.5000 mg | INHALATION_SOLUTION | RESPIRATORY_TRACT | Status: DC | PRN
  Administered 2024-03-23: 2.5 mg via RESPIRATORY_TRACT
  Filled 2024-03-23: qty 3
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
## 2024-03-23 NOTE — ED Provider Notes (Signed)
 [place] EMERGENCY DEPARTMENT AT [facility] Provider Note   CSN: [id number] Arrival date & time: 03/23/24  9172     Patient presents with: Shortness of Breath and Chest Pain   [name] is a 73 y.o. female.   HPI Adult female with CHF, asthma, presents via EMS with chest pain, shortness of breath. Patient evaluated at bedside with EMS on arrival.  Patient with respiratory distress, increased work of breathing, short sentences in reply.  She has been sick for 4 days, worse overnight.  EMS reports 2 DuoNebs, steroids, nitro without substantial change in pain.     Prior to Admission medications   Medication Sig Start Date End Date Taking? Authorizing Provider  albuterol  (PROAIR  HFA) 108 (90 Base) MCG/ACT inhaler Inhale 2 puffs into the lungs every 4 (four) hours as needed for wheezing or shortness of breath. 09/26/22   [name], MD  amLODipine  (NORVASC ) 10 MG tablet Take 1 tablet (10 mg total) by mouth daily. 11/01/23 03/06/24  West, Katlyn D, NP  aspirin  81 MG chewable tablet Chew 1 tablet (81 mg total) by mouth daily. 08/15/22   Henry Manuelita NOVAK, NP  atorvastatin  (LIPITOR ) 80 MG tablet Take 1 tablet (80 mg total) by mouth daily. 10/29/23   Jeffrie Oneil BROCKS, MD  benzonatate  (TESSALON ) 200 MG capsule Take 1 capsule (200 mg total) by mouth 2 (two) times daily. 03/01/24   Cherlyn Labella, MD  Biotin  5000 MCG CAPS Take 5,000 mcg by mouth daily.    [provider]  carvedilol  (COREG ) 25 MG tablet Take 1 tablet (25 mg total) by mouth 2 (two) times daily. 03/20/23   Jerilynn Lamarr HERO, NP  dextromethorphan -guaiFENesin  (MUCINEX  DM) 30-600 MG 12hr tablet Take 1 tablet by mouth 2 (two) times daily. 03/01/24   Akula, Vijaya, MD  divalproex  (DEPAKOTE  ER) 500 MG 24 hr tablet Take 1 tablet (500 mg total) by mouth at bedtime. 09/04/23   Gayland Lauraine PARAS, NP  empagliflozin  ([name] ) 10 MG TABS tablet Take 1 tablet by mouth once daily 10/29/23   Jeffrie Oneil BROCKS, MD  famotidine   (PEPCID ) 20 MG tablet Take 20 mg by mouth at bedtime as needed. 11/08/23   [provider]  ferrous sulfate  325 (65 FE) MG tablet Take 325 mg by mouth daily with breakfast. 07/15/21   [provider]  fluticasone  (FLONASE ) 50 MCG/ACT nasal spray Place 1 spray into both nostrils daily. 03/02/24   Akula, Vijaya, MD  ipratropium-albuterol  (DUONEB) 0.5-2.5 (3) MG/3ML SOLN Take 3 mLs by nebulization every 4 (four) hours as needed. 03/01/24   Akula, Vijaya, MD  isosorbide  mononitrate (IMDUR ) 30 MG 24 hr tablet Take 1 tablet (30 mg total) by mouth daily. 03/20/23   Jerilynn Lamarr HERO, NP  latanoprost  (XALATAN ) 0.005 % ophthalmic solution Place 1 drop into both eyes at bedtime. 02/05/20   [provider]  LORazepam  (ATIVAN ) 0.5 MG tablet Take 0.5 mg by mouth at bedtime. 11/03/22   [provider]  losartan  (COZAAR ) 25 MG tablet Take 1 tablet (25 mg total) by mouth daily. 03/20/23   Jerilynn Lamarr HERO, NP  magnesium  oxide (MAG-OX) 400 (240 Mg) MG tablet Take 1 tablet (400 mg total) by mouth daily. 05/14/21   Tobie Yetta HERO, MD  memantine  (NAMENDA ) 10 MG tablet Take 1 tablet (10 mg total) by mouth 2 (two) times daily. 09/04/23   Gayland Lauraine PARAS, NP  metFORMIN  (GLUCOPHAGE ) 1000 MG tablet Take 1,000 mg by mouth 2 (two) times daily with  a meal.    [provider]  montelukast  (SINGULAIR ) 10 MG tablet TAKE 1 TABLET BY MOUTH AT  BEDTIME 03/02/23   Wert, Michael B, MD  Multiple Vitamins-Minerals (ONE-A-DAY WOMENS PO) Take 1 tablet by mouth daily with breakfast.    [provider]  Nebulizers (COMPRESSOR/NEBULIZER) MISC 1 each by Does not apply route every 4 (four) hours as needed. 03/01/24   Akula, Vijaya, MD  nitroGLYCERIN  (NITROSTAT ) 0.4 MG SL tablet Place 1 tablet (0.4 mg total) under the tongue every 5 (five) minutes as needed for chest pain. Don't exceed more than 3 doses in 15 mins 06/24/21   Cheryle Page, MD  pantoprazole  (PROTONIX ) 40 MG tablet Take 1 tablet (40 mg  total) by mouth 2 (two) times daily before a meal. Patient taking differently: Take 40 mg by mouth daily. 06/24/21   Cheryle Page, MD  Probiotic Product (EQ PROBIOTIC) CAPS Take 1 capsule by mouth daily.    [provider]  saccharomyces boulardii (FLORASTOR) 250 MG capsule 1 capsule.    [provider]  tiZANidine  (ZANAFLEX ) 2 MG tablet Take 2 mg by mouth every 8 (eight) hours as needed. 02/02/23   [provider]  traZODone  (DESYREL ) 50 MG tablet Take 100 mg by mouth at bedtime as needed.    [provider]    Allergies: Tramadol  and Chlorthalidone     Review of Systems  Updated Vital Signs Pulse 87   Temp 97.9 F (36.6 C) (Axillary)   Ht 1.575 m (5' 2)   Wt 67.1 kg   LMP  (LMP Unknown)   SpO2 100%   BMI 27.07 kg/m   Physical Exam Vitals and nursing note reviewed.  Constitutional:      General: She is not in acute distress.    Appearance: She is well-developed.  HENT:     Head: Normocephalic and atraumatic.  Eyes:     Conjunctiva/sclera: Conjunctivae normal.  Cardiovascular:     Rate and Rhythm: Normal rate and regular rhythm.  Pulmonary:     Effort: Accessory muscle usage and respiratory distress present.     Breath sounds: No stridor. Decreased breath sounds and wheezing present.  Abdominal:     General: There is no distension.  Skin:    General: Skin is warm and dry.  Neurological:     Mental Status: She is alert and oriented to person, place, and time.     Cranial Nerves: No cranial nerve deficit.  Psychiatric:        Mood and Affect: Mood normal.     (all labs ordered are listed, but only abnormal results are displayed) Labs Reviewed - No data to display  EKG: None  Radiology: No results found.   Procedures   Medications Ordered in the ED  albuterol  (PROVENTIL ,VENTOLIN ) solution continuous neb (has no administration in time range)                                    Medical Decision Making Adult female with  multiple respiratory disease processes presents in respiratory distress, but is awake, alert, afebrile.  Pneumonia, CHF exacerbation, asthma exacerbation, viral process all considered. With chest pain ACS considered as well. Cardiac 85 sinus normal pulse ox 100% with nonrebreather mask abnormal  Amount and/or Complexity of Data Reviewed Independent Historian: EMS External Data Reviewed: notes. Labs: ordered. Decision-making details documented in ED Course. Radiology: ordered and independent interpretation performed. Decision-making details  documented in ED Course. ECG/medicine tests: ordered and independent interpretation performed. Decision-making details documented in ED Course.  Risk Prescription drug management. Decision regarding hospitalization. Diagnosis or treatment significantly limited by social determinants of health.   Update: After initial nebs, patient with persistent wheezing.  Update:, After continuous neb, patient improved markedly, but has audible wheezing.  Patient has transition from supplemental oxygen , but continue to have audible wheezing, no evidence for pneumonia, COVID, no hemodynamic instability, but with persistent increased work breathing, wheezing, with known asthma, patient will require admission for frequency of neb therapy, ongoing monitoring, management.  CRITICAL CARE Performed by: Lamar Salen Total critical care time: 40 minutes Critical care time was exclusive of separately billable procedures and treating other patients. Critical care was necessary to treat or prevent imminent or life-threatening deterioration. Critical care was time spent personally by me on the following activities: development of treatment plan with patient and/or surrogate as well as nursing, discussions with consultants, evaluation of patient's response to treatment, examination of patient, obtaining history from patient or surrogate, ordering and performing treatments and  interventions, ordering and review of laboratory studies, ordering and review of radiographic studies, pulse oximetry and re-evaluation of patient's condition.   Final diagnoses:  Severe asthma with acute exacerbation, unspecified whether persistent    ED Discharge Orders     None          Salen Lamar, MD 03/23/24 1547

## 2024-03-23 NOTE — H&P (Signed)
 History and Physical   SERAPHIM AFFINITO FMW:[id number] DOB: 1951/04/14 DOA: 03/23/2024  PCP: Arloa Elsie SAUNDERS, MD   Patient coming from: Home  Chief Complaint: Shortness of breath  HPI: [name] is a 73 y.o. female with medical history significant of hypertension, CKD 3A, diabetes, neuropathy, LPR, GERD, CAD, diastolic CHF, mild cognitive impairment, depression, seizure disorder, low cortisol function, asthma, obesity, pericardial effusion, anemia, PE presenting with worsening shortness of breath.  Patient reports 4 days of worsening shortness of breath that significantly worsened overnight.  Some associated chest pain/tightness.  EMS called today and patient received Solu-Medrol , DuoNeb x 2 and route.  Also received nitroglycerin  without improvement in pain.  She denies fevers, chills, abdominal pain, constipation, diarrhea, nausea, vomiting.  ED Course: Vital signs in ED notable for blood pressure in the 130s-150s systolic.  Initially requiring nonrebreather/10 L of oxygen  but with treatment in the ED has been weaned to room air.  Continues to wheeze significantly.  Lab workup notable for CMP with creatinine stable 1.2, glucose 210, calcium  8.7, protein 6.3, albumin  3.4.  CBC with hemoglobin stable 11.4.  BNP normal.  Chest x-ray showed no acute abnormality.  Patient received morphine , albuterol  x 2, budesonide  nebulizer in the ED with improvement in oxygen  requirement but continued symptoms and wheezing.  Review of Systems: As per HPI otherwise all other systems reviewed and are negative.  Past Medical History:  Diagnosis Date   Acute renal failure superimposed on stage 3b chronic kidney disease (HCC) 07/17/2016   Anxiety    Arthritis    Asthma    Chronic diastolic CHF (congestive heart failure) (HCC) 07/17/2016   Coronary artery disease    a. NSTEMI 12/2015 - LHC 01/18/16: s/p overlapping DESx2 to RCA, 10% ost-prox Cx,10% mLAD.   Dementia (HCC)    Depression    Diabetes  mellitus    Dyslipidemia    GERD (gastroesophageal reflux disease)    History of seizure    Hyperlipidemia    Hypertension    Hypertensive heart disease    NSTEMI (non-ST elevated myocardial infarction) (HCC) 12/2015   Stage 3 chronic kidney disease (HCC)     Past Surgical History:  Procedure Laterality Date   ABDOMINAL HYSTERECTOMY     CARDIAC CATHETERIZATION  1994   minimal LAD dz, no other dz, EF normal   CARDIAC CATHETERIZATION N/A 01/18/2016   Procedure: Left Heart Cath and Coronary Angiography;  Surgeon: Lonni JONETTA Cash, MD;  Location: [facility] INVASIVE CV LAB;  Service: Cardiovascular;  Laterality: N/A;   CARDIAC CATHETERIZATION N/A 01/18/2016   Procedure: Coronary Stent Intervention;  Surgeon: Lonni JONETTA Cash, MD;  Location: [facility] INVASIVE CV LAB;  Service: Cardiovascular;  Laterality: N/A;   COLONOSCOPY WITH PROPOFOL  N/A 04/09/2014   Procedure: COLONOSCOPY WITH PROPOFOL ;  Surgeon: Lamar Donnald GAILS, MD;  Location: [facility] ENDOSCOPY;  Service: Endoscopy;  Laterality: N/A;   CORONARY LITHOTRIPSY N/A 08/10/2022   Procedure: CORONARY LITHOTRIPSY;  Surgeon: Wendel Lurena POUR, MD;  Location: [facility] INVASIVE CV LAB;  Service: Cardiovascular;  Laterality: N/A;   CORONARY STENT INTERVENTION N/A 08/10/2022   Procedure: CORONARY STENT INTERVENTION;  Surgeon: Wendel Lurena POUR, MD;  Location: [facility] INVASIVE CV LAB;  Service: Cardiovascular;  Laterality: N/A;   CORONARY STENT PLACEMENT  01/19/2016   STENT SYNERGY DES 7.24K67 drug eluting stent was successfully placed, and overlaps the 2.5 x 38 mm Synergy stent placed distally.   CORONARY/GRAFT ACUTE MI REVASCULARIZATION N/A 08/10/2022   Procedure: Coronary/Graft Acute MI Revascularization;  Surgeon: Wendel,  Lurena POUR, MD;  Location: [facility] INVASIVE CV LAB;  Service: Cardiovascular;  Laterality: N/A;   ESOPHAGOGASTRODUODENOSCOPY (EGD) WITH PROPOFOL  N/A 04/09/2014   Procedure: ESOPHAGOGASTRODUODENOSCOPY (EGD) WITH PROPOFOL ;  Surgeon: Lamar Donnald GAILS, MD;   Location: [facility] ENDOSCOPY;  Service: Endoscopy;  Laterality: N/A;   FOOT SURGERY Bilateral    approx ~2000   HEMORRHOID SURGERY     HERNIA REPAIR     KNEE ARTHROSCOPY     LEFT HEART CATH AND CORONARY ANGIOGRAPHY N/A 08/10/2022   Procedure: LEFT HEART CATH AND CORONARY ANGIOGRAPHY;  Surgeon: Wendel Lurena POUR, MD;  Location: [facility] INVASIVE CV LAB;  Service: Cardiovascular;  Laterality: N/A;   RADIOACTIVE SEED GUIDED EXCISIONAL BREAST BIOPSY Left 01/20/2021   Procedure: RADIOACTIVE SEED GUIDED EXCISIONAL LEFT BREAST BIOPSY;  Surgeon: Aron Shoulders, MD;  Location: [facility] OR;  Service: General;  Laterality: Left;    Social History  reports that she quit smoking about 20 years ago. Her smoking use included cigarettes. She started smoking about 50 years ago. She has a 30 pack-year smoking history. She has never used smokeless tobacco. She reports that she does not drink alcohol and does not use drugs.  Allergies  Allergen Reactions   Tramadol  Other (See Comments)    Not to be taken because of existing seizure disorder/SEIZURES    Chlorthalidone  Other (See Comments)    Was stopped due to kidney function    Family History  Problem Relation Age of Onset   Allergies Mother    Asthma Mother    CAD Father 10   Diabetes Father    Hypertension Father    Congestive Heart Failure Sister        died in her 52s   CAD Sister 37  Reviewed on admission  Prior to Admission medications   Medication Sig Start Date End Date Taking? Authorizing Provider  albuterol  (PROAIR  HFA) 108 (90 Base) MCG/ACT inhaler Inhale 2 puffs into the lungs every 4 (four) hours as needed for wheezing or shortness of breath. 09/26/22   [name], MD  amLODipine  (NORVASC ) 10 MG tablet Take 1 tablet (10 mg total) by mouth daily. 11/01/23 03/06/24  West, Katlyn D, NP  aspirin  81 MG chewable tablet Chew 1 tablet (81 mg total) by mouth daily. 08/15/22   Henry Manuelita NOVAK, NP  atorvastatin  (LIPITOR ) 80 MG tablet Take 1 tablet (80 mg total)  by mouth daily. 10/29/23   Jeffrie Oneil BROCKS, MD  benzonatate  (TESSALON ) 200 MG capsule Take 1 capsule (200 mg total) by mouth 2 (two) times daily. 03/01/24   Cherlyn Labella, MD  Biotin  5000 MCG CAPS Take 5,000 mcg by mouth daily.    [provider]  carvedilol  (COREG ) 25 MG tablet Take 1 tablet (25 mg total) by mouth 2 (two) times daily. 03/20/23   Jerilynn Lamarr HERO, NP  dextromethorphan -guaiFENesin  (MUCINEX  DM) 30-600 MG 12hr tablet Take 1 tablet by mouth 2 (two) times daily. 03/01/24   Akula, Vijaya, MD  divalproex  (DEPAKOTE  ER) 500 MG 24 hr tablet Take 1 tablet (500 mg total) by mouth at bedtime. 09/04/23   Gayland Lauraine PARAS, NP  empagliflozin  ([name] ) 10 MG TABS tablet Take 1 tablet by mouth once daily 10/29/23   Jeffrie Oneil BROCKS, MD  famotidine  (PEPCID ) 20 MG tablet Take 20 mg by mouth at bedtime as needed. 11/08/23   [provider]  ferrous sulfate  325 (65 FE) MG tablet Take 325 mg by mouth daily with breakfast. 07/15/21   [provider]  fluticasone  (FLONASE )  50 MCG/ACT nasal spray Place 1 spray into both nostrils daily. 03/02/24   Akula, Vijaya, MD  ipratropium-albuterol  (DUONEB) 0.5-2.5 (3) MG/3ML SOLN Take 3 mLs by nebulization every 4 (four) hours as needed. 03/01/24   Akula, Vijaya, MD  isosorbide  mononitrate (IMDUR ) 30 MG 24 hr tablet Take 1 tablet (30 mg total) by mouth daily. 03/20/23   Jerilynn Lamarr HERO, NP  latanoprost  (XALATAN ) 0.005 % ophthalmic solution Place 1 drop into both eyes at bedtime. 02/05/20   [provider]  LORazepam  (ATIVAN ) 0.5 MG tablet Take 0.5 mg by mouth at bedtime. 11/03/22   [provider]  losartan  (COZAAR ) 25 MG tablet Take 1 tablet (25 mg total) by mouth daily. 03/20/23   Jerilynn Lamarr HERO, NP  magnesium  oxide (MAG-OX) 400 (240 Mg) MG tablet Take 1 tablet (400 mg total) by mouth daily. 05/14/21   Tobie Yetta HERO, MD  memantine  (NAMENDA ) 10 MG tablet Take 1 tablet (10 mg total) by mouth 2 (two) times daily. 09/04/23   Gayland Lauraine PARAS, NP  metFORMIN  (GLUCOPHAGE ) 1000 MG tablet Take 1,000 mg by mouth 2 (two) times daily with a meal.    [provider]  montelukast  (SINGULAIR ) 10 MG tablet TAKE 1 TABLET BY MOUTH AT  BEDTIME 03/02/23   Wert, Michael B, MD  Multiple Vitamins-Minerals (ONE-A-DAY WOMENS PO) Take 1 tablet by mouth daily with breakfast.    [provider]  Nebulizers (COMPRESSOR/NEBULIZER) MISC 1 each by Does not apply route every 4 (four) hours as needed. 03/01/24   Cherlyn Labella, MD  nitroGLYCERIN  (NITROSTAT ) 0.4 MG SL tablet Place 1 tablet (0.4 mg total) under the tongue every 5 (five) minutes as needed for chest pain. Don't exceed more than 3 doses in 15 mins 06/24/21   Cheryle Page, MD  pantoprazole  (PROTONIX ) 40 MG tablet Take 1 tablet (40 mg total) by mouth 2 (two) times daily before a meal. Patient taking differently: Take 40 mg by mouth daily. 06/24/21   Cheryle Page, MD  Probiotic Product (EQ PROBIOTIC) CAPS Take 1 capsule by mouth daily.    [provider]  saccharomyces boulardii (FLORASTOR) 250 MG capsule 1 capsule.    [provider]  tiZANidine  (ZANAFLEX ) 2 MG tablet Take 2 mg by mouth every 8 (eight) hours as needed. 02/02/23   [provider]  traZODone  (DESYREL ) 50 MG tablet Take 100 mg by mouth at bedtime as needed.    [provider]    Physical Exam: Vitals:   03/23/24 1115 03/23/24 1130 03/23/24 1145 03/23/24 1217  BP: (!) 155/77 (!) 152/78 95/70   Pulse: 87 87 91   Resp: 19 16 (!) 26   Temp:    98.1 F (36.7 C)  TempSrc:    Oral  SpO2: 99% 99% 96%   Weight:      Height:        Physical Exam Constitutional:      General: She is not in acute distress.    Appearance: Normal appearance.  HENT:     Head: Normocephalic and atraumatic.     Mouth/Throat:     Mouth: Mucous membranes are moist.     Pharynx: Oropharynx is clear.  Eyes:     Extraocular Movements: Extraocular movements intact.     Pupils: Pupils are equal, round,  and reactive to light.  Cardiovascular:     Rate and Rhythm: Normal rate and regular rhythm.     Pulses: Normal pulses.     Heart sounds: Normal heart  sounds.  Pulmonary:     Effort: Pulmonary effort is normal. No respiratory distress.     Breath sounds: Wheezing present.     Comments: Upper airway wheeze Abdominal:     General: Bowel sounds are normal. There is no distension.     Palpations: Abdomen is soft.     Tenderness: There is no abdominal tenderness.  Musculoskeletal:        General: No swelling or deformity.  Skin:    General: Skin is warm and dry.  Neurological:     General: No focal deficit present.     Mental Status: Mental status is at baseline.    Labs on Admission: I have personally reviewed following labs and imaging studies  CBC: Recent Labs  Lab 03/23/24 0844  WBC 8.0  NEUTROABS 4.3  HGB 11.4*  HCT 35.3*  MCV 99.2  PLT 188    Basic Metabolic Panel: Recent Labs  Lab 03/23/24 0844  NA 140  K 3.7  CL 101  CO2 24  GLUCOSE 210*  BUN 19  CREATININE 1.21*  CALCIUM  8.7*    GFR: Estimated Creatinine Clearance: 36.1 mL/min (A) (by C-G formula based on SCr of 1.21 mg/dL (H)).  Liver Function Tests: Recent Labs  Lab 03/23/24 0844  AST 23  ALT 14  ALKPHOS 52  BILITOT 1.0  PROT 6.3*  ALBUMIN  3.4*    Urine analysis:    Component Value Date/Time   COLORURINE YELLOW 02/25/2024 1833   APPEARANCEUR CLEAR 02/25/2024 1833   LABSPEC 1.025 02/25/2024 1833   PHURINE 5.5 02/25/2024 1833   GLUCOSEU >1,000 (A) 02/25/2024 1833   HGBUR NEGATIVE 02/25/2024 1833   BILIRUBINUR NEGATIVE 02/25/2024 1833   KETONESUR NEGATIVE 02/25/2024 1833   PROTEINUR NEGATIVE 02/25/2024 1833   UROBILINOGEN 0.2 08/07/2014 1304   NITRITE NEGATIVE 02/25/2024 1833   LEUKOCYTESUR NEGATIVE 02/25/2024 1833    Radiological Exams on Admission: DG Chest Port 1 View Result Date: 03/23/2024 EXAM: 1 VIEW(S) XRAY OF THE CHEST 03/23/2024 09:03:50 AM COMPARISON: 10/27//25  CLINICAL HISTORY: SOB FINDINGS: LUNGS AND PLEURA: No focal pulmonary opacity. No pleural effusion. No pneumothorax. HEART AND MEDIASTINUM: No acute abnormality of the cardiac and mediastinal silhouettes. BONES AND SOFT TISSUES: No acute osseous abnormality. IMPRESSION: 1. No acute cardiopulmonary process. Electronically signed by: Waddell Calk MD 03/23/2024 09:10 AM EST RP Workstation: [id number]   EKG: Independently reviewed.  Sinus rhythm at 80 bpm.  Nonspecific T wave changes.  Assessment/Plan Active Problems:   Type 2 diabetes mellitus with hyperglycemia (HCC)   Essential hypertension   GERD (gastroesophageal reflux disease)   Asthma exacerbation   CAD in native artery   Seizure disorder (HCC)   Chronic diastolic CHF (congestive heart failure) (HCC)   Macrocytic anemia   Chronic kidney disease, stage 3a (HCC)   Anxiety   Obesity (BMI 30-39.9)   Major depression, single episode   Polyneuropathy due to type 2 diabetes mellitus (HCC)   Mild cognitive impairment   Laryngopharyngeal reflux (LPR)   Acute respiratory failure with hypoxia (HCC)   Asthma exacerbation Vocal cord dysfunction Acute respite failure with hypoxia > Patient presenting with 4 days of shortness of breath that significantly worsened overnight. > Initially was hypoxic for EMS and arrived on nonrebreather.  With treatments in the ED has been weaned off oxygen  but continues to have symptoms and wheezing. > By EMS received Solu-Medrol  and DuoNeb x 2.  In ED received albuterol , continuous albuterol , budesonide  neb. > Patient has known history of prior vocal cord  dysfunction.  Exam consistent with this though some lower lung field wheezing (has had multiple treatments).  Has seen ENT for this in the past but had improvement with lifestyle/diet modifications and PPI for treatment of LPR. - Monitor on telemetry with continuous pulse ox - Oxygen  as needed - Continue with daily steroids - Scheduled DuoNebs - As needed  albuterol  - Check flu COVID and RSV screen for possible trigger, consider RVP if negative. For vocal cord dysfunction - Daily Claritin  - As needed Tussionex for cough - As needed Xanax  for anxiety - Increase PPI to twice daily - SLP consult for evaluation and breathing exercises  Hypertension - Continue amlodipine , Coreg , Imdur , losartan   Diabetes - SSI  LPR GERD - Continue home PPI (increased back to twice daily) and as needed Pepcid   CAD - Continue home ASA, Coreg , Imdur , losartan , atorvastatin   Chronic diastolic CHF Pericardial effusion > Last echo was in May 2024 and showed EF 60-65%, G1 DD, normal RV function.  Trivial pericardial effusion. > Not currently on diuretic. - Continue Coreg , losartan   Mild cognitive impairment - Continue home Namenda   Depression - On trazodone  as needed for sleep  Seizure disorder - Continue home Depakote   Obesity - Noted  Anemia - Hemoglobin stable 11.4, trend CBC  History of PE - Noted  DVT prophylaxis: Lovenox  Code Status:   Full Family Communication:  Updated at bedside  Disposition Plan:   Patient is from:  Home  Anticipated DC to:  Home  Anticipated DC date:  1 to 2 days  Anticipated DC barriers: None  Consults called:  None Admission status:  Observation, telemetry  Severity of Illness: The appropriate patient status for this patient is OBSERVATION. Observation status is judged to be reasonable and necessary in order to provide the required intensity of service to ensure the patient's safety. The patient's presenting symptoms, physical exam findings, and initial radiographic and laboratory data in the context of their medical condition is felt to place them at decreased risk for further clinical deterioration. Furthermore, it is anticipated that the patient will be medically stable for discharge from the hospital within 2 midnights of admission.    Marsa KATHEE Scurry MD [facility]  How to contact the [facility]  Attending or Consulting provider 7A - 7P or covering provider during after hours 7P -7A, for this patient?   Check the care team in [facility] and look for a) attending/consulting [facility] provider listed and b) the [facility] team listed Log into [email or web address] and use [facility] universal password to access. If you do not have the password, please contact the hospital operator. Locate the [facility] provider you are looking for under [facility] and page to a number that you can be directly reached. If you still have difficulty reaching the provider, please page the [facility] (Director on Call) for the Hospitalists listed on amion for assistance.  03/23/2024, 12:44 PM

## 2024-03-24 ENCOUNTER — Inpatient Hospital Stay: Admitting: Internal Medicine

## 2024-03-24 DIAGNOSIS — J9601 Acute respiratory failure with hypoxia: Secondary | ICD-10-CM | POA: Diagnosis not present

## 2024-03-24 LAB — COMPREHENSIVE METABOLIC PANEL WITH GFR
ALT: 18 U/L (ref 0–44)
AST: 21 U/L (ref 15–41)
Albumin: 3.4 g/dL — ABNORMAL LOW (ref 3.5–5.0)
Alkaline Phosphatase: 54 U/L (ref 38–126)
Anion gap: 8 (ref 5–15)
BUN: 22 mg/dL (ref 8–23)
CO2: 29 mmol/L (ref 22–32)
Calcium: 8.9 mg/dL (ref 8.9–10.3)
Chloride: 101 mmol/L (ref 98–111)
Creatinine, Ser: 1.03 mg/dL — ABNORMAL HIGH (ref 0.44–1.00)
GFR, Estimated: 57 mL/min — ABNORMAL LOW (ref >60)
Glucose, Bld: 154 mg/dL — ABNORMAL HIGH (ref 70–99)
Potassium: 4.3 mmol/L (ref 3.5–5.1)
Sodium: 138 mmol/L (ref 135–145)
Total Bilirubin: 0.8 mg/dL (ref 0.0–1.2)
Total Protein: 6.7 g/dL (ref 6.5–8.1)

## 2024-03-24 LAB — CBC
HCT: 35.6 % — ABNORMAL LOW (ref 36.0–46.0)
Hemoglobin: 12.1 g/dL (ref 12.0–15.0)
MCH: 32.1 pg (ref 26.0–34.0)
MCHC: 34 g/dL (ref 30.0–36.0)
MCV: 94.4 fL (ref 80.0–100.0)
Platelets: 186 K/uL (ref 150–400)
RBC: 3.77 MIL/uL — ABNORMAL LOW (ref 3.87–5.11)
RDW: 14.2 % (ref 11.5–15.5)
WBC: 9.2 K/uL (ref 4.0–10.5)
nRBC: 0 % (ref 0.0–0.2)

## 2024-03-24 LAB — GLUCOSE, CAPILLARY
Glucose-Capillary: 140 mg/dL — ABNORMAL HIGH (ref 70–99)
Glucose-Capillary: 119 mg/dL — ABNORMAL HIGH (ref 70–99)
Glucose-Capillary: 152 mg/dL — ABNORMAL HIGH (ref 70–99)
Glucose-Capillary: 183 mg/dL — ABNORMAL HIGH (ref 70–99)

## 2024-03-24 MED ORDER — BUDESONIDE 0.25 MG/2ML IN SUSP
0.2500 mg | Freq: Two times a day (BID) | RESPIRATORY_TRACT | Status: DC
  Administered 2024-03-24 – 2024-03-26 (×4): 0.25 mg via RESPIRATORY_TRACT
  Filled 2024-03-24 (×4): qty 2

## 2024-03-24 MED ORDER — METHYLPREDNISOLONE SODIUM SUCC 125 MG IJ SOLR
40.0000 mg | Freq: Two times a day (BID) | INTRAMUSCULAR | Status: DC
  Administered 2024-03-24 – 2024-03-26 (×4): 40 mg via INTRAVENOUS
  Filled 2024-03-24 (×4): qty 2

## 2024-03-24 MED ORDER — INSULIN ASPART 100 UNIT/ML IJ SOLN
2.0000 [IU] | Freq: Once | INTRAMUSCULAR | Status: AC
  Administered 2024-03-24: 2 [IU] via SUBCUTANEOUS
  Filled 2024-03-24: qty 2

## 2024-03-24 NOTE — Evaluation (Signed)
 Speech Language Pathology Evaluation Patient Details Name: [name] MRN: [id number] DOB: October 18, 1950 Today's Date: 03/24/2024 Time: 8642-8568 SLP Time Calculation (min) (ACUTE ONLY): 34 min  Problem List:  Patient Active Problem List   Diagnosis Date Noted   Acute respiratory failure with hypoxia (HCC) 03/23/2024   Seizure-like activity (HCC) 02/26/2024   AMS (altered mental status) 02/25/2024   Pericarditis 08/14/2022   Pericardial effusion 08/14/2022   STEMI (ST elevation myocardial infarction) (HCC) 08/10/2022   Diarrhea 09/29/2021   Mild cognitive impairment 08/14/2021   Insomnia 08/01/2021   Localized edema 08/01/2021   Major depression, single episode 08/01/2021   Menopausal symptom 08/01/2021   Polyneuropathy due to type 2 diabetes mellitus (HCC) 08/01/2021   Vitamin D deficiency 08/01/2021   Obesity (BMI 30-39.9) 06/22/2021   Leukocytosis 06/22/2021   Hyponatremia 06/22/2021   Anemia of chronic disease 06/22/2021   Anxiety 06/19/2021   Biphasic stridor    Respiratory distress    Hypomagnesemia 05/13/2021   Abnormal swallowing 03/29/2021   Acute non-ST segment elevation myocardial infarction (HCC) 03/29/2021   Chronic diarrhea 03/29/2021   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   Abnormal mammogram of left breast 12/13/2020   Memory loss 03/20/2019   Severe persistent asthma without complication (HCC) 04/04/2018   Laryngopharyngeal reflux (LPR) 04/04/2018   Chronic kidney disease, stage 3a (HCC) 03/15/2018   Macrocytic anemia 08/12/2017   Therapeutic drug monitoring 05/17/2017   Lung nodule 08/01/2016   Chronic diastolic CHF (congestive heart failure) (HCC) 07/17/2016   Right leg swelling 02/27/2016   Seizure disorder (HCC) 01/27/2016   Numbness 01/27/2016   CAD in native artery 01/20/2016   NSTEMI (non-ST elevated myocardial infarction) (HCC) 01/18/2016   Dyspnea on exertion    Upper airway cough syndrome with VCD 08/28/2014   Asthma  exacerbation 08/09/2011   Allergic rhinitis 07/22/2008   Vocal cord dysfunction 07/22/2008   Type 2 diabetes mellitus with hyperglycemia (HCC) 07/21/2008   Essential hypertension 07/21/2008   GERD (gastroesophageal reflux disease) 07/21/2008   PULMONARY EMBOLISM, HX OF 07/21/2008   Past Medical History:  Past Medical History:  Diagnosis Date   Acute renal failure superimposed on stage 3b chronic kidney disease (HCC) 07/17/2016   Anxiety    Arthritis    Asthma    Chronic diastolic CHF (congestive heart failure) (HCC) 07/17/2016   Coronary artery disease    a. NSTEMI 12/2015 - LHC 01/18/16: s/p overlapping DESx2 to RCA, 10% ost-prox Cx,10% mLAD.   Dementia (HCC)    Depression    Diabetes mellitus    Dyslipidemia    GERD (gastroesophageal reflux disease)    History of seizure    Hyperlipidemia    Hypertension    Hypertensive heart disease    NSTEMI (non-ST elevated myocardial infarction) (HCC) 12/2015   Stage 3 chronic kidney disease (HCC)    Past Surgical History:  Past Surgical History:  Procedure Laterality Date   ABDOMINAL HYSTERECTOMY     CARDIAC CATHETERIZATION  1994   minimal LAD dz, no other dz, EF normal   CARDIAC CATHETERIZATION N/A 01/18/2016   Procedure: Left Heart Cath and Coronary Angiography;  Surgeon: Lonni JONETTA Cash, MD;  Location: [facility] INVASIVE CV LAB;  Service: Cardiovascular;  Laterality: N/A;   CARDIAC CATHETERIZATION N/A 01/18/2016   Procedure: Coronary Stent Intervention;  Surgeon: Lonni JONETTA Cash, MD;  Location: [facility] INVASIVE CV LAB;  Service: Cardiovascular;  Laterality: N/A;   COLONOSCOPY WITH PROPOFOL  N/A 04/09/2014   Procedure: COLONOSCOPY WITH PROPOFOL ;  Surgeon: Lamar Bunk  V, MD;  Location: [facility] ENDOSCOPY;  Service: Endoscopy;  Laterality: N/A;   CORONARY LITHOTRIPSY N/A 08/10/2022   Procedure: CORONARY LITHOTRIPSY;  Surgeon: Wendel Lurena POUR, MD;  Location: [facility] INVASIVE CV LAB;  Service: Cardiovascular;  Laterality: N/A;   CORONARY STENT  INTERVENTION N/A 08/10/2022   Procedure: CORONARY STENT INTERVENTION;  Surgeon: Wendel Lurena POUR, MD;  Location: [facility] INVASIVE CV LAB;  Service: Cardiovascular;  Laterality: N/A;   CORONARY STENT PLACEMENT  01/19/2016   STENT SYNERGY DES [id number] drug eluting stent was successfully placed, and overlaps the 2.5 x 38 mm Synergy stent placed distally.   CORONARY/GRAFT ACUTE MI REVASCULARIZATION N/A 08/10/2022   Procedure: Coronary/Graft Acute MI Revascularization;  Surgeon: Wendel Lurena POUR, MD;  Location: [facility] INVASIVE CV LAB;  Service: Cardiovascular;  Laterality: N/A;   ESOPHAGOGASTRODUODENOSCOPY (EGD) WITH PROPOFOL  N/A 04/09/2014   Procedure: ESOPHAGOGASTRODUODENOSCOPY (EGD) WITH PROPOFOL ;  Surgeon: Lamar Donnald GAILS, MD;  Location: [facility];  Service: Endoscopy;  Laterality: N/A;   FOOT SURGERY Bilateral    approx ~2000   HEMORRHOID SURGERY     HERNIA REPAIR     KNEE ARTHROSCOPY     LEFT HEART CATH AND CORONARY ANGIOGRAPHY N/A 08/10/2022   Procedure: LEFT HEART CATH AND CORONARY ANGIOGRAPHY;  Surgeon: Wendel Lurena POUR, MD;  Location: [facility] INVASIVE CV LAB;  Service: Cardiovascular;  Laterality: N/A;   RADIOACTIVE SEED GUIDED EXCISIONAL BREAST BIOPSY Left 01/20/2021   Procedure: RADIOACTIVE SEED GUIDED EXCISIONAL LEFT BREAST BIOPSY;  Surgeon: Aron Shoulders, MD;  Location: [facility] OR;  Service: General;  Laterality: Left;   HPI:  Ms. Mysliwiec is a 73 y.o. female who presented to [facility] hospital on 03/23/24 with SOB concerning for asthma exacerbation vs acute respiratory failure with hypoxia. SLP consulted to evaluate voice and offer vocal exercises as indicated. CXR clear.  PmHx significant for HTN, CKD 3A, DM, neuropathy, LPR, GERD, CAD, diastolic CHF, mild cognitive impairment, depression, seizure disorder, low cortisol function, asthma, obesity, pericardial effusion, anemia, PE, and reported vocal cord dysfunction.   Assessment / Plan / Recommendation Clinical Impression   Pt seen for informal and subjective  speech/voice assessment. An ENT consult is recommended for laryngoscopy assessment due to hx of vocal cord dysfunction per chart with prior ENT evaluation and new onset of throat pain and intermittent odynophagia.   Pt presents with abnormal /s/ /z/ ratio of >1.4 which could indicate a vocal cord disorder. /s/ /z/ ration completed x3 with the following results: 1.74 (abnormal), 0.72 (WNL), and 1.65 Z (abnormal). Pt able to sustain vowel /a/ for about 5.94 seconds and vowel /I/ for about 10.53 seconds. Subjective assessment of vocal quality was grossly WNL with no evidence of roughness, breathiness, strain, or pitch. Vocal intensity appeared [facility]. Pt denied concerns for changes in her vocal quality. Audible wheeze and reduced breath support for running speech observed with intermittent periods of shortness of breath.   Pt mentioned intermittent L neck pain, continued GERD symptoms despite lifestyle changes and taking PPIs. She reported some odynophagia, though wants to hold off on a MBSS at this time.   Case discussed with MD. ENT consult pending for laryngoscopy.   SLP will follow up to review ENT note and provide intervention for voice as indicated. Per pt, she has follow up visit with GI scheduled for next month.     SLP Assessment  SLP Recommendation/Assessment:  (TBD- recommend ENT consult for laryngoscopy evaluation) SLP Visit Diagnosis:  (r/o voice impairment; ENT consult pending)     Assistance Recommended at  Discharge  None  Functional Status Assessment Patient has had a recent decline in their functional status and demonstrates the ability to make significant improvements in function in a reasonable and predictable amount of time.  Frequency and Duration     TBD      SLP Evaluation          Expression Expression Primary Mode of Expression: Verbal Verbal Expression Overall Verbal Expression: Appears within functional limits for tasks assessed Initiation: No  impairment Pragmatics: No impairment Non-Verbal Means of Communication: Not applicable   Oral / Motor  Oral Motor/Sensory Function Overall Oral Motor/Sensory Function: Within functional limits Motor Speech Overall Motor Speech: Appears within functional limits for tasks assessed Respiration: Impaired Level of Impairment: Sentence Phonation: Normal Resonance: Within functional limits Articulation: Within functional limitis Intelligibility: Intelligible Motor Planning: Within functional limits Motor Speech Errors: Not applicable Effective Techniques: Pause (for breath support)            Peyton JINNY Rummer 03/24/2024, 3:03 PM

## 2024-03-24 NOTE — Consult Note (Signed)
 ENT CONSULT:  Reason for Consult: Wheezing  Referring Physician:  Vernal Alstrom MD  HPI: [name] is an 73 y.o. female with a a complex medical history including hypertension, CKD 3, type 2 diabetes, Juengel pharyngeal reflux, GERD, coronary artery disease, diastolic heart failure, depression, seizure disorder, obesity, asthma, history of pulmonary embolism who was admitted yesterday to [facility] for shortness of breath, wheezing and chest tightness.  Patient has been treated by medical team for possible asthma exacerbation.  Patient has reported history of vocal cord dysfunction in the past.  Speech therapy consult today evaluated patient and wanted ENT consultation for symptoms.  Patient reports approximately 3 days of shortness of breath, wheezing, cough and neck soreness in the left greater than right submandibular region.  She states she has had vocal cord issues in the past.  She quit smoking in 2005.   Past Medical History:  Diagnosis Date   Acute renal failure superimposed on stage 3b chronic kidney disease (HCC) 07/17/2016   Anxiety    Arthritis    Asthma    Chronic diastolic CHF (congestive heart failure) (HCC) 07/17/2016   Coronary artery disease    a. NSTEMI 12/2015 - LHC 01/18/16: s/p overlapping DESx2 to RCA, 10% ost-prox Cx,10% mLAD.   Dementia (HCC)    Depression    Diabetes mellitus    Dyslipidemia    GERD (gastroesophageal reflux disease)    History of seizure    Hyperlipidemia    Hypertension    Hypertensive heart disease    NSTEMI (non-ST elevated myocardial infarction) (HCC) 12/2015   Stage 3 chronic kidney disease (HCC)     Past Surgical History:  Procedure Laterality Date   ABDOMINAL HYSTERECTOMY     CARDIAC CATHETERIZATION  1994   minimal LAD dz, no other dz, EF normal   CARDIAC CATHETERIZATION N/A 01/18/2016   Procedure: Left Heart Cath and Coronary Angiography;  Surgeon: Lonni JONETTA Cash, MD;  Location: [facility] INVASIVE CV LAB;  Service:  Cardiovascular;  Laterality: N/A;   CARDIAC CATHETERIZATION N/A 01/18/2016   Procedure: Coronary Stent Intervention;  Surgeon: Lonni JONETTA Cash, MD;  Location: [facility] INVASIVE CV LAB;  Service: Cardiovascular;  Laterality: N/A;   COLONOSCOPY WITH PROPOFOL  N/A 04/09/2014   Procedure: COLONOSCOPY WITH PROPOFOL ;  Surgeon: Lamar Donnald GAILS, MD;  Location: [facility];  Service: Endoscopy;  Laterality: N/A;   CORONARY LITHOTRIPSY N/A 08/10/2022   Procedure: CORONARY LITHOTRIPSY;  Surgeon: Wendel Lurena POUR, MD;  Location: [facility] INVASIVE CV LAB;  Service: Cardiovascular;  Laterality: N/A;   CORONARY STENT INTERVENTION N/A 08/10/2022   Procedure: CORONARY STENT INTERVENTION;  Surgeon: Wendel Lurena POUR, MD;  Location: [facility] INVASIVE CV LAB;  Service: Cardiovascular;  Laterality: N/A;   CORONARY STENT PLACEMENT  01/19/2016   STENT SYNERGY DES [id number] drug eluting stent was successfully placed, and overlaps the 2.5 x 38 mm Synergy stent placed distally.   CORONARY/GRAFT ACUTE MI REVASCULARIZATION N/A 08/10/2022   Procedure: Coronary/Graft Acute MI Revascularization;  Surgeon: Wendel Lurena POUR, MD;  Location: [facility] INVASIVE CV LAB;  Service: Cardiovascular;  Laterality: N/A;   ESOPHAGOGASTRODUODENOSCOPY (EGD) WITH PROPOFOL  N/A 04/09/2014   Procedure: ESOPHAGOGASTRODUODENOSCOPY (EGD) WITH PROPOFOL ;  Surgeon: Lamar Donnald GAILS, MD;  Location: [facility];  Service: Endoscopy;  Laterality: N/A;   FOOT SURGERY Bilateral    approx ~2000   HEMORRHOID SURGERY     HERNIA REPAIR     KNEE ARTHROSCOPY     LEFT HEART CATH AND CORONARY ANGIOGRAPHY N/A 08/10/2022   Procedure: LEFT  HEART CATH AND CORONARY ANGIOGRAPHY;  Surgeon: Thukkani, Arun K, MD;  Location: [facility] INVASIVE CV LAB;  Service: Cardiovascular;  Laterality: N/A;   RADIOACTIVE SEED GUIDED EXCISIONAL BREAST BIOPSY Left 01/20/2021   Procedure: RADIOACTIVE SEED GUIDED EXCISIONAL LEFT BREAST BIOPSY;  Surgeon: Aron Shoulders, MD;  Location: [facility] OR;  Service: General;   Laterality: Left;    Family History  Problem Relation Age of Onset   Allergies Mother    Asthma Mother    CAD Father 98   Diabetes Father    Hypertension Father    Congestive Heart Failure Sister        died in her 45s   CAD Sister 11    Social History:  reports that she quit smoking about 20 years ago. Her smoking use included cigarettes. She started smoking about 50 years ago. She has a 30 pack-year smoking history. She has never used smokeless tobacco. She reports that she does not drink alcohol and does not use drugs.  Allergies:  Allergies  Allergen Reactions   Tramadol  Other (See Comments)    Not to be taken because of existing seizure disorder/SEIZURES    Chlorthalidone  Other (See Comments)    Was stopped due to kidney function    Medications:   Results for orders placed or performed during the hospital encounter of 03/23/24 (from the past 48 hours)  Resp panel by RT-PCR (RSV, Flu A&B, Covid) Anterior Nasal Swab     Status: None   Collection Time: 03/23/24  8:44 AM   Specimen: Anterior Nasal Swab  Result Value Ref Range   SARS Coronavirus 2 by RT PCR NEGATIVE NEGATIVE   Influenza A by PCR NEGATIVE NEGATIVE   Influenza B by PCR NEGATIVE NEGATIVE    Comment: (NOTE) The Xpert Xpress SARS-CoV-2/FLU/RSV plus assay is intended as an aid in the diagnosis of influenza from Nasopharyngeal swab specimens and should not be used as a sole basis for treatment. Nasal washings and aspirates are unacceptable for Xpert Xpress SARS-CoV-2/FLU/RSV testing.  Fact Sheet for Patients: bloggercourse.com  Fact Sheet for Healthcare Providers: seriousbroker.it  This test is not yet approved or cleared by the [place]  FDA and has been authorized for detection and/or diagnosis of SARS-CoV-2 by FDA under an Emergency Use Authorization (EUA). This EUA will remain in effect (meaning this test can be used) for the duration of  the COVID-19 declaration under Section 564(b)(1) of the Act, 21 U.S.C. section 360bbb-3(b)(1), unless the authorization is terminated or revoked.     Resp Syncytial Virus by PCR NEGATIVE NEGATIVE    Comment: (NOTE) Fact Sheet for Patients: bloggercourse.com  Fact Sheet for Healthcare Providers: seriousbroker.it  This test is not yet approved or cleared by the [place]  FDA and has been authorized for detection and/or diagnosis of SARS-CoV-2 by FDA under an Emergency Use Authorization (EUA). This EUA will remain in effect (meaning this test can be used) for the duration of the COVID-19 declaration under Section 564(b)(1) of the Act, 21 U.S.C. section 360bbb-3(b)(1), unless the authorization is terminated or revoked.  Performed at [facility], [address] [place], [place] [address]   Comprehensive metabolic panel     Status: Abnormal   Collection Time: 03/23/24  8:44 AM  Result Value Ref Range   Sodium 140 135 - 145 mmol/L   Potassium 3.7 3.5 - 5.1 mmol/L   Chloride 101 98 - 111 mmol/L   CO2 24 22 - 32 mmol/L   Glucose, Bld 210 (H) 70 -  99 mg/dL    Comment: Glucose reference range applies only to samples taken after fasting for at least 8 hours.   BUN 19 8 - 23 mg/dL   Creatinine, Ser 8.78 (H) 0.44 - 1.00 mg/dL   Calcium  8.7 (L) 8.9 - 10.3 mg/dL   Total Protein 6.3 (L) 6.5 - 8.1 g/dL   Albumin  3.4 (L) 3.5 - 5.0 g/dL   AST 23 15 - 41 U/L   ALT 14 0 - 44 U/L   Alkaline Phosphatase 52 38 - 126 U/L   Total Bilirubin 1.0 0.0 - 1.2 mg/dL   GFR, Estimated 47 (L) >60 mL/min    Comment: (NOTE) Calculated using the CKD-EPI Creatinine Equation (2021)    Anion gap 15 5 - 15    Comment: Performed at [facility], [address] [place], [place] [address]  CBC with Differential/Platelet     Status: Abnormal   Collection Time: 03/23/24  8:44 AM  Result Value Ref Range   WBC 8.0 4.0 - 10.5 K/uL   RBC 3.56 (L)  3.87 - 5.11 MIL/uL   Hemoglobin 11.4 (L) 12.0 - 15.0 g/dL   HCT 64.6 (L) 63.9 - 53.9 %   MCV 99.2 80.0 - 100.0 fL   MCH 32.0 26.0 - 34.0 pg   MCHC 32.3 30.0 - 36.0 g/dL   RDW 85.4 88.4 - 84.4 %   Platelets 188 150 - 400 K/uL   nRBC 0.0 0.0 - 0.2 %   Neutrophils Relative % 54 %   Neutro Abs 4.3 1.7 - 7.7 K/uL   Lymphocytes Relative 30 %   Lymphs Abs 2.4 0.7 - 4.0 K/uL   Monocytes Relative 13 %   Monocytes Absolute 1.0 0.1 - 1.0 K/uL   Eosinophils Relative 2 %   Eosinophils Absolute 0.2 0.0 - 0.5 K/uL   Basophils Relative 1 %   Basophils Absolute 0.1 0.0 - 0.1 K/uL   Immature Granulocytes 0 %   Abs Immature Granulocytes 0.03 0.00 - 0.07 K/uL    Comment: Performed at [facility], [address] [place], [place] [address]  Brain natriuretic peptide     Status: None   Collection Time: 03/23/24  8:44 AM  Result Value Ref Range   B Natriuretic Peptide 24.6 0.0 - 100.0 pg/mL    Comment: Performed at [facility], [address], [place], [place] [address]  Glucose, capillary     Status: Abnormal   Collection Time: 03/23/24  3:00 PM  Result Value Ref Range   Glucose-Capillary 282 (H) 70 - 99 mg/dL    Comment: Glucose reference range applies only to samples taken after fasting for at least 8 hours.  Glucose, capillary     Status: Abnormal   Collection Time: 03/23/24  5:15 PM  Result Value Ref Range   Glucose-Capillary 330 (H) 70 - 99 mg/dL    Comment: Glucose reference range applies only to samples taken after fasting for at least 8 hours.  Glucose, capillary     Status: Abnormal   Collection Time: 03/23/24  9:09 PM  Result Value Ref Range   Glucose-Capillary 190 (H) 70 - 99 mg/dL    Comment: Glucose reference range applies only to samples taken after fasting for at least 8 hours.  Comprehensive metabolic panel     Status: Abnormal   Collection Time: 03/24/24  5:39 AM  Result Value Ref Range   Sodium 138 135 - 145 mmol/L   Potassium 4.3 3.5 -  5.1 mmol/L   Chloride  101 98 - 111 mmol/L   CO2 29 22 - 32 mmol/L   Glucose, Bld 154 (H) 70 - 99 mg/dL    Comment: Glucose reference range applies only to samples taken after fasting for at least 8 hours.   BUN 22 8 - 23 mg/dL   Creatinine, Ser 8.96 (H) 0.44 - 1.00 mg/dL   Calcium  8.9 8.9 - 10.3 mg/dL   Total Protein 6.7 6.5 - 8.1 g/dL   Albumin  3.4 (L) 3.5 - 5.0 g/dL   AST 21 15 - 41 U/L   ALT 18 0 - 44 U/L   Alkaline Phosphatase 54 38 - 126 U/L   Total Bilirubin 0.8 0.0 - 1.2 mg/dL   GFR, Estimated 57 (L) >60 mL/min    Comment: (NOTE) Calculated using the CKD-EPI Creatinine Equation (2021)    Anion gap 8 5 - 15    Comment: Performed at [facility], [address] [place], [place] [address]  CBC     Status: Abnormal   Collection Time: 03/24/24  5:39 AM  Result Value Ref Range   WBC 9.2 4.0 - 10.5 K/uL   RBC 3.77 (L) 3.87 - 5.11 MIL/uL   Hemoglobin 12.1 12.0 - 15.0 g/dL   HCT 64.3 (L) 63.9 - 53.9 %   MCV 94.4 80.0 - 100.0 fL   MCH 32.1 26.0 - 34.0 pg   MCHC 34.0 30.0 - 36.0 g/dL   RDW 85.7 88.4 - 84.4 %   Platelets 186 150 - 400 K/uL   nRBC 0.0 0.0 - 0.2 %    Comment: Performed at [facility], [address] [place], [place] [address]  Glucose, capillary     Status: Abnormal   Collection Time: 03/24/24  6:40 AM  Result Value Ref Range   Glucose-Capillary 140 (H) 70 - 99 mg/dL    Comment: Glucose reference range applies only to samples taken after fasting for at least 8 hours.  Glucose, capillary     Status: Abnormal   Collection Time: 03/24/24  8:34 AM  Result Value Ref Range   Glucose-Capillary 119 (H) 70 - 99 mg/dL    Comment: Glucose reference range applies only to samples taken after fasting for at least 8 hours.  Glucose, capillary     Status: Abnormal   Collection Time: 03/24/24 12:26 PM  Result Value Ref Range   Glucose-Capillary 152 (H) 70 - 99 mg/dL    Comment: Glucose reference range applies only to samples taken after fasting for at least 8 hours.  Glucose,  capillary     Status: Abnormal   Collection Time: 03/24/24  4:28 PM  Result Value Ref Range   Glucose-Capillary 183 (H) 70 - 99 mg/dL    Comment: Glucose reference range applies only to samples taken after fasting for at least 8 hours.    DG Chest Port 1 View Result Date: 03/23/2024 EXAM: 1 VIEW(S) XRAY OF THE CHEST 03/23/2024 09:03:50 AM COMPARISON: 10/27//25 CLINICAL HISTORY: SOB FINDINGS: LUNGS AND PLEURA: No focal pulmonary opacity. No pleural effusion. No pneumothorax. HEART AND MEDIASTINUM: No acute abnormality of the cardiac and mediastinal silhouettes. BONES AND SOFT TISSUES: No acute osseous abnormality. IMPRESSION: 1. No acute cardiopulmonary process. Electronically signed by: Waddell Calk MD 03/23/2024 09:10 AM EST RP Workstation: [id number]    MND:[id number] other than stated per HPI  Blood pressure 123/67, pulse 72, temperature 97.8 F (36.6 C), temperature source Oral, resp. rate 16, height 5' 2 (1.575  m), weight 67.1 kg, SpO2 100%.  PHYSICAL EXAM:  Pleasant adult female resting in bed in no acute distress.  Able to sit up without difficulty.  She has intermittent wheezing and cough but no audible stridor.  Voice is normal.  Oral exam unremarkable.  Oropharynx healthy mucosa no exudates or erythema.  Neck soft supple.  Left greater than right submandibular gland tenderness and firmness consistent with sialoadenitis versus viral inflammation.  Preop diagnosis: Wheezing Postop diagnosis: same Procedure: Transnasal fiberoptic laryngoscopy Surgeon: Keshav Winegar Anesth: Topical with 4% lidocaine  Compl: None Findings: Right deviated nasal septum, 3+ ITH, narrow nasal cavity, nasopharynx normal, oropharynx normal, vocal cords normal with normal abduction and adduction - no evidence of laryngeal pathology or vocal cord dysfunction:   Procedure Note: 31575(Flex lary). Informed verbal consent was obtained after explaining the risks (including bleeding and infection), benefits and  alternatives of the procedure. Verbal timeout was performed prior to the procedure. The nose was topicalized with topical lidocaine /oxymetazoline . The flexible fiberoptic laryngoscope was advanced through the left nasal cavity. The septum and turbinates appeared normal. The middle meatus was free of polyps of purulence. The eustachian tube, choana, and adenoids were normal in appearance. The hypopharynx, arytenoids, false vocal folds, and true vocal folds appeared normal. The visualized portion of the subglottis appeared normal. The patient tolerated the procedure with no immediate complications.    Studies Reviewed: C XR 03/24/24  FINDINGS:   LUNGS AND PLEURA: No focal pulmonary opacity. No pleural effusion. No pneumothorax.   HEART AND MEDIASTINUM: No acute abnormality of the cardiac and mediastinal silhouettes.   BONES AND SOFT TISSUES: No acute osseous abnormality.   IMPRESSION: 1. No acute cardiopulmonary process.   Electronically signed by: Waddell Calk MD 03/23/2024 09:10 AM EST RP Workstation: [id number]    Assessment/Plan: 73 year old female admitted for asthma exacerbation, wheezing, cough.  Bedside flexible fiberoptic laryngoscopy unremarkable.  No laryngeal pathology.  Left greater than right submandibular gland firmness and tenderness consistent with sialoadenitis.  Recommendations - Adequate hydration  - Warm compresses to submandibular glands PRN - Trial of sialogogues (sour candy, lemon juice) - No ENT Intervention indicated  Medical Decision Making: 3 #/Compl Problems  3                       Data Rev  3             Management 2             (1-Straightforward, 2-Low, 3- Moderate, 4-High)   Electronically signed by:  Elspeth Coddington, MD  Facial Plastic & Reconstructive Surgery Otolaryngology - Head and Neck Surgery [facility]   03/24/2024, 4:30 PM

## 2024-03-24 NOTE — Progress Notes (Deleted)
 Subjective:   Patient ID: [name], female    DOB: [birth year]    MRN: [id number]    Brief patient profile:  20   yobf quit smoking 2005 with nl pfts 2012  with recurrent non-specific resp flares with nl pfts during flares c/w pseudoasthma.     History of Present Illness  01/24/2016  Extended post hosp f/u ov/ transition of care/ /[name] re: dtca asthma/ vcd symb 160 and pred 5 x 10 mg daily  Chief Complaint  Patient presents with   Follow-up    Pt. recently got out the hopistal for an asthma attack, Pt. states her breathing remains the same, coughing with some yellow to white mucus,Has been needing to use Ventolin  more often  last doing well 2 months prior to OV   While on Symb 160/ gerd rx but not dosing ac as rec  Waking up prematurely x 2 months with cough/wheeze/ sob but never contacted me as per the instructions rec Plan A = Automatic = dulera  100 Take 2 puffs first thing in am and then another 2 puffs about 12 hours later.                                      Nexum 40 mg Take 30-60 min before first meal of the day and Pepcid  AC 20 mg  at bedtime Work on inhaler technique  Plan B = Backup Only use your albuterol  as a rescue medication Plan C = Crisis - only use your albuterol  nebulizer if you first try Plan B and it fails to help > ok to use the nebulizer up to every 4 hours but if start needing it regularly call for immediate appointment GERD diet  Take delsym  two tsp every 12 hours and use the flutter valve as much as possible and supplement if needed with  tramadol  50 mg up to 2 every 4 hours to suppress the urge to cough  Once you have eliminated the cough for 3 straight days try reducing the tramadol  first,  then the delsym  as tolerated.   Prednisone  5 mg x 8, then 6, then 4 then 2, then 1 daily x 2 days and off  please schedule a follow up office visit in 2 weeks, sooner if needed with all meds/ inhalers/ neb solutions in hand      03/15/2022  f/u ov/[name] re:  vcd/asthma  maint on performist/ bud/singulair / gerd rx   / did fine with surgery  Chief Complaint  Patient presents with   Follow-up    No new issues since LOV.  Dyspnea:  walk anywhere since L knee surgery but mostly w/in house and mb and back Cough: none  Sleeping: flat bed 2 pillows SABA use: none  02: none  Rec If any flare of resp symptoms go ahead and resume twice daily nebulizer treatments with perforomist  and budesonide  Late add: ok to leave off the pm dose of PPI if doing great        03/24/2024  f/u ov/[name] re: vcd/asthma    maint on ***  No chief complaint on file.   Dyspnea:  *** Cough: *** Sleeping: *** resp cc  SABA use: *** 02: ***  Lung cancer screening :  ***    No obvious day to day or daytime variability or assoc excess/ purulent sputum or mucus plugs or hemoptysis or cp or chest tightness, subjective wheeze  or overt sinus or hb symptoms.    Also denies any obvious fluctuation of symptoms with weather or environmental changes or other aggravating or alleviating factors except as outlined above   No unusual exposure hx or h/o childhood pna/ asthma or knowledge of premature birth.  Current Allergies, Complete Past Medical History, Past Surgical History, Family History, and Social History were reviewed in [employer] record.  ROS  The following are not active complaints unless bolded Hoarseness, sore throat, dysphagia, dental problems, itching, sneezing,  nasal congestion or discharge of excess mucus or purulent secretions, ear ache,   fever, chills, sweats, unintended wt loss or wt gain, classically pleuritic or exertional cp,  orthopnea pnd or arm/hand swelling  or leg swelling, presyncope, palpitations, abdominal pain, anorexia, nausea, vomiting, diarrhea  or change in bowel habits or change in bladder habits, change in stools or change in urine, dysuria, hematuria,  rash, arthralgias, visual complaints, headache, numbness, weakness or  ataxia or problems with walking or coordination,  change in mood or  memory.        No outpatient medications have been marked as taking for the 03/24/24 encounter (Appointment) with Jacqueline Ozell NOVAK, MD.           Objective:   Physical Exam  Wt  03/24/2024    ***  09/26/2022     144  03/15/2022    157  01/20/2022      165  06/28/2021      173  05/17/2021      165 03/15/2021    161  09/08/2020      153 01/12/2020      150  09/09/2019     147  03/12/2019   157  11/04/2018       157  02/23/2016   168 >  04/07/2016  168 > 05/08/2016    168 >  07/17/2016  162 > 09/18/2016    164  > 09/18/2017  160 > 12/28/2017 167  > 03/11/2018  164 > 03/20/2018   160 > 04/05/2018   171 > 04/18/2018  165    Vital signs reviewed  03/24/2024  - Note at rest 02 sats  ***% on ***   General appearance:    ***

## 2024-03-24 NOTE — Progress Notes (Signed)
 PROGRESS NOTE  [name] FMW:[id number] DOB: 05/21/1950 DOA: 03/23/2024 PCP: Arloa Elsie SAUNDERS, MD   LOS: 0 days   Brief narrative:   [name] is a 73 y.o. female with medical history significant of hypertension, CKD 3A, diabetes, neuropathy, LPR, GERD, CAD, diastolic CHF, mild cognitive impairment, depression, seizure disorder, low cortisol function, asthma, obesity, pericardial effusion, anemia, pulmonary embolism presented to hospital with 4-day history of worsening shortness of breath chest pain tightness.  Denies any fever or chills.  EMS was called in and received a Solu-Medrol  DuoNeb en route to the hospital.  In the ED, patient was on nonrebreather at 10 L/min and had significant wheezing.  Labs were notable for creatinine of 1.2 albumin  3.4.  Chest x-ray without any infiltrate.  Patient received morphine  albuterol  budesonide  nebulizer and was admitted hospital for further evaluation and treatment.  Assessment/Plan: Active Problems:   Type 2 diabetes mellitus with hyperglycemia (HCC)   Essential hypertension   GERD (gastroesophageal reflux disease)   Asthma exacerbation   CAD in native artery   Seizure disorder (HCC)   Chronic diastolic CHF (congestive heart failure) (HCC)   Macrocytic anemia   Chronic kidney disease, stage 3a (HCC)   Anxiety   Obesity (BMI 30-39.9)   Major depression, single episode   Polyneuropathy due to type 2 diabetes mellitus (HCC)   Mild cognitive impairment   Laryngopharyngeal reflux (LPR)   Acute respiratory failure with hypoxia (HCC)  Asthma exacerbation Vocal cord dysfunction Acute respite failure with hypoxia Patient had 4-day history of shortness of breath and was hypoxic on presentation.  Received DuoNebs and Solu-Medrol .  Has history of vocal cord dysfunction as well.  Will continue with prednisone , DuoNebs.  Had seen ENT in the past and lifestyle modifications were advised including PPI.  Patient stated that it has been a while that  she has not followed up with ENT.  Continue DuoNebs Claritin  Tussionex Xanax  PPI.  Speech therapy consultation.  Might benefit from speech therapy for vocal cord dysfunction.   Hypertension - Slightly elevated.  Continue amlodipine , Coreg , Imdur , losartan .  Continue to monitor.   Diabetes Continue sliding scale insulin  Accu-Cheks diabetic diet.  Adjust POC glucose of 140.   GERD Continue PPI twice daily and as needed Pepcid .   CAD No acute issues.  Continue home ASA, Coreg , Imdur , losartan , atorvastatin    Chronic diastolic CHF Pericardial effusion Review of last 2D echo was in May 2024 and showed EF 60-65%, G1 DD, normal RV function.  Trivial pericardial effusion.  Not on diuretics.  Continue Coreg , losartan    Mild cognitive impairment Continue Namenda    Depression On trazodone    Seizure disorder Continue Depakote   Anemia Latest hemoglobin of 12.1 and stable.   History of PE No acute issues.   DVT prophylaxis: enoxaparin  (LOVENOX ) injection 40 mg Start: 03/23/24 2000   Disposition: Likely home in 1 to 2 days  Status is: Observation The patient will require care spanning > 2 midnights and should be moved to inpatient because: Pending clinical improvement, ongoing wheezing, IV steroids and bronchodilators.    Code Status:     Code Status: Full Code  Family Communication: None at bedside  Consultants: None  Procedures: None  Anti-infectives:  None  Anti-infectives (From admission, onward)    None        Subjective: Today, patient was seen and examined at bedside.  Feels a little better but still has some cough upper airway wheezing and discomfort.  Objective: Vitals:   03/24/24  0746 03/24/24 0820  BP: (!) 168/79   Pulse: 75 75  Resp: 16 18  Temp: 97.9 F (36.6 C)   SpO2: 100% 100%    Intake/Output Summary (Last 24 hours) at 03/24/2024 1153 Last data filed at 03/24/2024 0900 Gross per 24 hour  Intake 240 ml  Output --  Net 240 ml    Filed Weights   03/23/24 0834  Weight: 67.1 kg   Body mass index is 27.07 kg/m.   Physical Exam: GENERAL: Patient is alert awake and oriented. Not in obvious distress.  Elderly female, Communicative.  No wheezing on talking but at rest has audible wheezes. HENT: No scleral pallor or icterus. Pupils equally reactive to light. Oral mucosa is moist NECK: is supple, no gross swelling noted. CHEST: Diminished breath sounds bilaterally. CVS: S1 and S2 heard, no murmur. Regular rate and rhythm.  ABDOMEN: Soft, non-tender, bowel sounds are present. EXTREMITIES: No edema. CNS: Cranial nerves are intact.  Moves all extremities. SKIN: warm and dry without rashes.  Data Review: I have personally reviewed the following laboratory data and studies,  CBC: Recent Labs  Lab 03/23/24 0844 03/24/24 0539  WBC 8.0 9.2  NEUTROABS 4.3  --   HGB 11.4* 12.1  HCT 35.3* 35.6*  MCV 99.2 94.4  PLT 188 186   Basic Metabolic Panel: Recent Labs  Lab 03/23/24 0844 03/24/24 0539  NA 140 138  K 3.7 4.3  CL 101 101  CO2 24 29  GLUCOSE 210* 154*  BUN 19 22  CREATININE 1.21* 1.03*  CALCIUM  8.7* 8.9   Liver Function Tests: Recent Labs  Lab 03/23/24 0844 03/24/24 0539  AST 23 21  ALT 14 18  ALKPHOS 52 54  BILITOT 1.0 0.8  PROT 6.3* 6.7  ALBUMIN  3.4* 3.4*   No results for input(s): LIPASE, AMYLASE in the last 168 hours. No results for input(s): AMMONIA in the last 168 hours. Cardiac Enzymes: No results for input(s): CKTOTAL, CKMB, CKMBINDEX, TROPONINI in the last 168 hours. BNP (last 3 results) Recent Labs    03/23/24 0844  BNP 24.6    ProBNP (last 3 results) Recent Labs    02/25/24 1810  PROBNP 57.4    CBG: Recent Labs  Lab 03/23/24 1500 03/23/24 1715 03/23/24 2109 03/24/24 0640 03/24/24 0834  GLUCAP 282* 330* 190* 140* 119*   Recent Results (from the past 240 hours)  Resp panel by RT-PCR (RSV, Flu A&B, Covid) Anterior Nasal Swab     Status: None    Collection Time: 03/23/24  8:44 AM   Specimen: Anterior Nasal Swab  Result Value Ref Range Status   SARS Coronavirus 2 by RT PCR NEGATIVE NEGATIVE Final   Influenza A by PCR NEGATIVE NEGATIVE Final   Influenza B by PCR NEGATIVE NEGATIVE Final    Comment: (NOTE) The Xpert Xpress SARS-CoV-2/FLU/RSV plus assay is intended as an aid in the diagnosis of influenza from Nasopharyngeal swab specimens and should not be used as a sole basis for treatment. Nasal washings and aspirates are unacceptable for Xpert Xpress SARS-CoV-2/FLU/RSV testing.  Fact Sheet for Patients: bloggercourse.com  Fact Sheet for Healthcare Providers: seriousbroker.it  This test is not yet approved or cleared by the [place]  FDA and has been authorized for detection and/or diagnosis of SARS-CoV-2 by FDA under an Emergency Use Authorization (EUA). This EUA will remain in effect (meaning this test can be used) for the duration of the COVID-19 declaration under Section 564(b)(1) of the Act, 21 U.S.C. section 360bbb-3(b)(1), unless the authorization  is terminated or revoked.     Resp Syncytial Virus by PCR NEGATIVE NEGATIVE Final    Comment: (NOTE) Fact Sheet for Patients: bloggercourse.com  Fact Sheet for Healthcare Providers: seriousbroker.it  This test is not yet approved or cleared by the [place]  FDA and has been authorized for detection and/or diagnosis of SARS-CoV-2 by FDA under an Emergency Use Authorization (EUA). This EUA will remain in effect (meaning this test can be used) for the duration of the COVID-19 declaration under Section 564(b)(1) of the Act, 21 U.S.C. section 360bbb-3(b)(1), unless the authorization is terminated or revoked.  Performed at [facility], [address] [place], [place] [address]      Studies: DG Chest Port 1 View Result Date: 03/23/2024 EXAM: 1 VIEW(S)  XRAY OF THE CHEST 03/23/2024 09:03:50 AM COMPARISON: 10/27//25 CLINICAL HISTORY: SOB FINDINGS: LUNGS AND PLEURA: No focal pulmonary opacity. No pleural effusion. No pneumothorax. HEART AND MEDIASTINUM: No acute abnormality of the cardiac and mediastinal silhouettes. BONES AND SOFT TISSUES: No acute osseous abnormality. IMPRESSION: 1. No acute cardiopulmonary process. Electronically signed by: Waddell Calk MD 03/23/2024 09:10 AM EST RP Workstation: [id number]      Vernal Alstrom, MD  [facility] 03/24/2024  If 7PM-7AM, please contact night-coverage

## 2024-03-24 NOTE — Hospital Course (Signed)
 [name] is a 73 y.o. female with medical history significant of hypertension, CKD 3A, diabetes, neuropathy, LPR, GERD, CAD, diastolic CHF, mild cognitive impairment, depression, seizure disorder, low cortisol function, asthma, obesity, pericardial effusion, anemia, pulmonary embolism presented to hospital with 4-day history of worsening shortness of breath chest pain tightness.  Denies any fever or chills.  EMS was called in and received a Solu-Medrol  DuoNeb en route to the hospital.  In the ED patient was on nonrebreather at 10 L/min and had significant wheezing.  Labs were notable for creatinine of 1.2 albumin  3.4.  Chest x-ray without any infiltrate.  Patient received morphine  albuterol  budesonide  nebulizer and was admitted hospital for further evaluation and treatment.  Asthma exacerbation Vocal cord dysfunction Acute respite failure with hypoxia Patient had 4-day history of shortness of breath and was hypoxic on presentation.  Received DuoNebs and Solu-Medrol .  Has history of vocal cord dysfunction as well.  Had seen ENT in the past and lifestyle modifications were advised including PPI.  Continue DuoNebs Claritin  Tussionex Xanax  PPI.  Speech therapy consultation.    Hypertension - Slightly elevated.  Continue amlodipine , Coreg , Imdur , losartan .  Continue to monitor.   Diabetes Continue sliding scale insulin  Accu-Cheks diabetic diet.  Adjust POC glucose of 140.   GERD Continue PPI twice daily and as needed Pepcid .   CAD No acute issues.  Continue home ASA, Coreg , Imdur , losartan , atorvastatin    Chronic diastolic CHF Pericardial effusion Review of last 2D echo was in May 2024 and showed EF 60-65%, G1 DD, normal RV function.  Trivial pericardial effusion.  Not on diuretics.  Continue Coreg , losartan    Mild cognitive impairment Continue Namenda    Depression On trazodone    Seizure disorder Continue Depakote   Anemia Latest hemoglobin of 12.1 and stable.   History of PE No  acute issues.

## 2024-03-24 NOTE — Care Management Obs Status (Signed)
 MEDICARE OBSERVATION STATUS NOTIFICATION   Patient Details  Name: DEZIREE MOKRY MRN: [id number] Date of Birth: [birth year]   Medicare Observation Status Notification Given:  Yes    Jennie Laneta Dragon 03/24/2024, 3:48 PM

## 2024-03-25 DIAGNOSIS — I2510 Atherosclerotic heart disease of native coronary artery without angina pectoris: Secondary | ICD-10-CM | POA: Diagnosis not present

## 2024-03-25 DIAGNOSIS — Z7982 Long term (current) use of aspirin: Secondary | ICD-10-CM | POA: Diagnosis not present

## 2024-03-25 DIAGNOSIS — Z8249 Family history of ischemic heart disease and other diseases of the circulatory system: Secondary | ICD-10-CM | POA: Diagnosis not present

## 2024-03-25 DIAGNOSIS — E785 Hyperlipidemia, unspecified: Secondary | ICD-10-CM | POA: Diagnosis not present

## 2024-03-25 DIAGNOSIS — I5032 Chronic diastolic (congestive) heart failure: Secondary | ICD-10-CM | POA: Diagnosis not present

## 2024-03-25 DIAGNOSIS — E1122 Type 2 diabetes mellitus with diabetic chronic kidney disease: Secondary | ICD-10-CM | POA: Diagnosis not present

## 2024-03-25 DIAGNOSIS — G40909 Epilepsy, unspecified, not intractable, without status epilepticus: Secondary | ICD-10-CM | POA: Diagnosis not present

## 2024-03-25 DIAGNOSIS — E669 Obesity, unspecified: Secondary | ICD-10-CM | POA: Diagnosis not present

## 2024-03-25 DIAGNOSIS — T380X5A Adverse effect of glucocorticoids and synthetic analogues, initial encounter: Secondary | ICD-10-CM | POA: Diagnosis not present

## 2024-03-25 DIAGNOSIS — F0394 Unspecified dementia, unspecified severity, with anxiety: Secondary | ICD-10-CM | POA: Diagnosis not present

## 2024-03-25 DIAGNOSIS — E1140 Type 2 diabetes mellitus with diabetic neuropathy, unspecified: Secondary | ICD-10-CM | POA: Diagnosis not present

## 2024-03-25 DIAGNOSIS — J9601 Acute respiratory failure with hypoxia: Secondary | ICD-10-CM | POA: Diagnosis not present

## 2024-03-25 DIAGNOSIS — J4530 Mild persistent asthma, uncomplicated: Secondary | ICD-10-CM | POA: Diagnosis not present

## 2024-03-25 DIAGNOSIS — J383 Other diseases of vocal cords: Secondary | ICD-10-CM | POA: Diagnosis not present

## 2024-03-25 DIAGNOSIS — K219 Gastro-esophageal reflux disease without esophagitis: Secondary | ICD-10-CM | POA: Diagnosis not present

## 2024-03-25 DIAGNOSIS — J4541 Moderate persistent asthma with (acute) exacerbation: Secondary | ICD-10-CM | POA: Diagnosis not present

## 2024-03-25 DIAGNOSIS — Z7984 Long term (current) use of oral hypoglycemic drugs: Secondary | ICD-10-CM | POA: Diagnosis not present

## 2024-03-25 DIAGNOSIS — E1165 Type 2 diabetes mellitus with hyperglycemia: Secondary | ICD-10-CM | POA: Diagnosis not present

## 2024-03-25 DIAGNOSIS — F32A Depression, unspecified: Secondary | ICD-10-CM | POA: Diagnosis not present

## 2024-03-25 DIAGNOSIS — Z833 Family history of diabetes mellitus: Secondary | ICD-10-CM | POA: Diagnosis not present

## 2024-03-25 DIAGNOSIS — J4540 Moderate persistent asthma, uncomplicated: Secondary | ICD-10-CM | POA: Diagnosis not present

## 2024-03-25 DIAGNOSIS — N1832 Chronic kidney disease, stage 3b: Secondary | ICD-10-CM | POA: Diagnosis not present

## 2024-03-25 DIAGNOSIS — F0393 Unspecified dementia, unspecified severity, with mood disturbance: Secondary | ICD-10-CM | POA: Diagnosis not present

## 2024-03-25 DIAGNOSIS — Z1152 Encounter for screening for COVID-19: Secondary | ICD-10-CM | POA: Diagnosis not present

## 2024-03-25 DIAGNOSIS — D631 Anemia in chronic kidney disease: Secondary | ICD-10-CM | POA: Diagnosis not present

## 2024-03-25 DIAGNOSIS — Z6827 Body mass index (BMI) 27.0-27.9, adult: Secondary | ICD-10-CM | POA: Diagnosis not present

## 2024-03-25 DIAGNOSIS — I130 Hypertensive heart and chronic kidney disease with heart failure and stage 1 through stage 4 chronic kidney disease, or unspecified chronic kidney disease: Secondary | ICD-10-CM | POA: Diagnosis not present

## 2024-03-25 LAB — BASIC METABOLIC PANEL WITH GFR
Anion gap: 13 (ref 5–15)
BUN: 30 mg/dL — ABNORMAL HIGH (ref 8–23)
CO2: 24 mmol/L (ref 22–32)
Calcium: 8.9 mg/dL (ref 8.9–10.3)
Chloride: 102 mmol/L (ref 98–111)
Creatinine, Ser: 1.14 mg/dL — ABNORMAL HIGH (ref 0.44–1.00)
GFR, Estimated: 50 mL/min — ABNORMAL LOW (ref >60)
Glucose, Bld: 244 mg/dL — ABNORMAL HIGH (ref 70–99)
Potassium: 4.2 mmol/L (ref 3.5–5.1)
Sodium: 139 mmol/L (ref 135–145)

## 2024-03-25 LAB — CBC
HCT: 36.6 % (ref 36.0–46.0)
Hemoglobin: 12.2 g/dL (ref 12.0–15.0)
MCH: 32 pg (ref 26.0–34.0)
MCHC: 33.3 g/dL (ref 30.0–36.0)
MCV: 96.1 fL (ref 80.0–100.0)
Platelets: 204 K/uL (ref 150–400)
RBC: 3.81 MIL/uL — ABNORMAL LOW (ref 3.87–5.11)
RDW: 14.1 % (ref 11.5–15.5)
WBC: 8.3 K/uL (ref 4.0–10.5)
nRBC: 0 % (ref 0.0–0.2)

## 2024-03-25 LAB — GLUCOSE, CAPILLARY
Glucose-Capillary: 314 mg/dL — ABNORMAL HIGH (ref 70–99)
Glucose-Capillary: 283 mg/dL — ABNORMAL HIGH (ref 70–99)
Glucose-Capillary: 180 mg/dL — ABNORMAL HIGH (ref 70–99)
Glucose-Capillary: 157 mg/dL — ABNORMAL HIGH (ref 70–99)
Glucose-Capillary: 216 mg/dL — ABNORMAL HIGH (ref 70–99)
Glucose-Capillary: 292 mg/dL — ABNORMAL HIGH (ref 70–99)

## 2024-03-25 LAB — HEMOGLOBIN A1C
Hgb A1c MFr Bld: 6.2 % — ABNORMAL HIGH (ref 4.8–5.6)
Mean Plasma Glucose: 131 mg/dL

## 2024-03-25 LAB — MAGNESIUM: Magnesium: 2.1 mg/dL (ref 1.7–2.4)

## 2024-03-25 MED ORDER — INSULIN ASPART 100 UNIT/ML IJ SOLN
0.0000 [IU] | Freq: Three times a day (TID) | INTRAMUSCULAR | Status: DC
  Administered 2024-03-25: 8 [IU] via SUBCUTANEOUS
  Administered 2024-03-26: 3 [IU] via SUBCUTANEOUS
  Filled 2024-03-25: qty 8
  Filled 2024-03-25: qty 3

## 2024-03-25 MED ORDER — NITROGLYCERIN 0.4 MG SL SUBL: 0.4000 mg | SUBLINGUAL_TABLET | SUBLINGUAL | Status: DC | PRN

## 2024-03-25 MED ORDER — MAGNESIUM OXIDE -MG SUPPLEMENT 400 (240 MG) MG PO TABS
400.0000 mg | ORAL_TABLET | Freq: Every day | ORAL | Status: DC
  Administered 2024-03-25 – 2024-03-26 (×2): 400 mg via ORAL
  Filled 2024-03-25 (×2): qty 1

## 2024-03-25 MED ORDER — RISAQUAD PO CAPS
1.0000 | ORAL_CAPSULE | Freq: Every day | ORAL | Status: DC
  Administered 2024-03-25 – 2024-03-26 (×2): 1 via ORAL
  Filled 2024-03-25 (×2): qty 1

## 2024-03-25 MED ORDER — TIZANIDINE HCL 4 MG PO TABS
2.0000 mg | ORAL_TABLET | Freq: Three times a day (TID) | ORAL | Status: DC | PRN
Start: 2024-03-25 — End: 2024-03-26

## 2024-03-25 MED ORDER — IPRATROPIUM-ALBUTEROL 0.5-2.5 (3) MG/3ML IN SOLN
3.0000 mL | Freq: Two times a day (BID) | RESPIRATORY_TRACT | Status: DC
Start: 2024-03-26 — End: 2024-03-26
  Administered 2024-03-26: 3 mL via RESPIRATORY_TRACT
  Filled 2024-03-25: qty 3

## 2024-03-25 MED ORDER — ADULT MULTIVITAMIN W/MINERALS CH
1.0000 | ORAL_TABLET | Freq: Every day | ORAL | Status: DC
  Administered 2024-03-26: 1 via ORAL
  Filled 2024-03-25: qty 1

## 2024-03-25 MED ORDER — FLUTICASONE PROPIONATE 50 MCG/ACT NA SUSP
1.0000 | Freq: Every day | NASAL | Status: DC
  Administered 2024-03-25 – 2024-03-26 (×2): 1 via NASAL
  Filled 2024-03-25: qty 16

## 2024-03-25 MED ORDER — INSULIN ASPART 100 UNIT/ML IJ SOLN: 0.0000 [IU] | Freq: Every day | INTRAMUSCULAR | Status: DC

## 2024-03-25 NOTE — Progress Notes (Addendum)
 PROGRESS NOTE  [name] FMW:[id number] DOB: 1950-06-10 DOA: 03/23/2024 PCP: Arloa Elsie SAUNDERS, MD   LOS: 0 days   Brief narrative:   [name] is a 73 y.o. female with medical history significant of hypertension, CKD 3A, diabetes, neuropathy, LPR, GERD, CAD, diastolic CHF, mild cognitive impairment, depression, seizure disorder, low cortisol function, asthma, obesity, pericardial effusion, anemia, pulmonary embolism presented to hospital with 4-day history of worsening shortness of breath chest pain tightness.  Denies any fever or chills.  EMS was called in and received a Solu-Medrol  DuoNeb en route to the hospital.  In the ED, patient was on nonrebreather at 10 L/min and had significant wheezing.  Labs were notable for creatinine of 1.2 albumin  3.4.  Chest x-ray without any infiltrate.  Patient received morphine  albuterol  budesonide  nebulizer and was admitted hospital for further evaluation and treatment.  Assessment/Plan: Active Problems:   Type 2 diabetes mellitus with hyperglycemia (HCC)   Essential hypertension   GERD (gastroesophageal reflux disease)   Asthma exacerbation   CAD in native artery   Seizure disorder (HCC)   Chronic diastolic CHF (congestive heart failure) (HCC)   Macrocytic anemia   Chronic kidney disease, stage 3a (HCC)   Anxiety   Obesity (BMI 30-39.9)   Major depression, single episode   Polyneuropathy due to type 2 diabetes mellitus (HCC)   Mild cognitive impairment   Laryngopharyngeal reflux (LPR)   Acute respiratory failure with hypoxia (HCC)  Asthma exacerbation Acute respite failure with hypoxia Patient had 4-day history of shortness of breath and was hypoxic on presentation.  Received DuoNebs and Solu-Medrol .  Patient stated she had a history of vocal cord dysfunction in the past and had seen ENT.   ENT was consulted at this time and vocal cords were within normal range.  Speech therapy been consulted.  Patient still complains of no wheezing.  At  this time likely asthma flare.  She was supposed to follow-up with pulmonary as outpatient but has missed an appointment.  Patient will benefit from asthma inhalers on discharge.  Patient still has significant wheezing so we will continue with IV Solu-Medrol , budesonide  inhalers for now.  Increase the frequency of DuoNebs.  Add Flonase  nasal drops as well.  Hypertension Continue amlodipine , Coreg , Imdur , losartan .  Continue to monitor.   Diabetes melitis type II with hyperglycemia. Likely secondary to steroids.  Continue sliding scale insulin  Accu-Cheks diabetic diet.  Adjust POC glucose of 314.  Check hemoglobin A1c.  Last hemoglobin A1c 1 year back was 6.3.   GERD Continue PPI twice daily and as needed Pepcid .   CAD No acute issues.  Continue home ASA, Coreg , Imdur , losartan , atorvastatin    Chronic diastolic CHF Pericardial effusion Review of last 2D echo was in May 2024 and showed EF 60-65%, G1 DD, normal RV function.  Trivial pericardial effusion.  Not on diuretics.  Continue Coreg , losartan    Mild cognitive impairment Continue Namenda    Depression On trazodone    Seizure disorder Continue Depakote   Anemia Latest hemoglobin of 12.1 and stable.   History of PE No acute issues.   DVT prophylaxis: enoxaparin  (LOVENOX ) injection 40 mg Start: 03/23/24 2000   Disposition: Likely home tomorrow if continues to improve  Status is: Observation The patient will require care spanning > 2 midnights and should be moved to inpatient because: Pending clinical improvement, ongoing wheezing, IV steroids and bronchodilators.    Code Status:     Code Status: Full Code  Family Communication: Spoke with the patient's son  on the phone and updated him about the clinical condition of the patient.  Consultants: None  Procedures: None  Anti-infectives:  None  Anti-infectives (From admission, onward)    None        Subjective: Today, patient was seen and examined at bedside.   Patient still complains of cough, significant wheezing and some shortness of breath.  Still has some throat denies any nausea vomiting fever chills or rigor.  Objective: Vitals:   03/25/24 0845 03/25/24 1014  BP:  (!) 160/79  Pulse:    Resp:    Temp:    SpO2: 99%     Intake/Output Summary (Last 24 hours) at 03/25/2024 1135 Last data filed at 03/24/2024 2200 Gross per 24 hour  Intake 237 ml  Output --  Net 237 ml   Filed Weights   03/23/24 0834  Weight: 67.1 kg   Body mass index is 27.07 kg/m.   Physical Exam: GENERAL: Patient is alert awake and oriented. Not in obvious distress.  Elderly female, Communicative.  Significant wheezes especially on expiration HENT: No scleral pallor or icterus. Pupils equally reactive to light. Oral mucosa is moist NECK: is supple, no gross swelling noted. CHEST: Diminished breath sounds bilaterally.  Significant expiratory wheezing CVS: S1 and S2 heard, no murmur. Regular rate and rhythm.  ABDOMEN: Soft, non-tender, bowel sounds are present. EXTREMITIES: No edema. CNS: Cranial nerves are intact.  Moves all extremities. SKIN: warm and dry without rashes.  Data Review: I have personally reviewed the following laboratory data and studies,  CBC: Recent Labs  Lab 03/23/24 0844 03/24/24 0539 03/25/24 0519  WBC 8.0 9.2 8.3  NEUTROABS 4.3  --   --   HGB 11.4* 12.1 12.2  HCT 35.3* 35.6* 36.6  MCV 99.2 94.4 96.1  PLT 188 186 204   Basic Metabolic Panel: Recent Labs  Lab 03/23/24 0844 03/24/24 0539 03/25/24 0519  NA 140 138 139  K 3.7 4.3 4.2  CL 101 101 102  CO2 24 29 24   GLUCOSE 210* 154* 244*  BUN 19 22 30*  CREATININE 1.21* 1.03* 1.14*  CALCIUM  8.7* 8.9 8.9  MG  --   --  2.1   Liver Function Tests: Recent Labs  Lab 03/23/24 0844 03/24/24 0539  AST 23 21  ALT 14 18  ALKPHOS 52 54  BILITOT 1.0 0.8  PROT 6.3* 6.7  ALBUMIN  3.4* 3.4*   No results for input(s): LIPASE, AMYLASE in the last 168 hours. No results for  input(s): AMMONIA in the last 168 hours. Cardiac Enzymes: No results for input(s): CKTOTAL, CKMB, CKMBINDEX, TROPONINI in the last 168 hours. BNP (last 3 results) Recent Labs    03/23/24 0844  BNP 24.6    ProBNP (last 3 results) Recent Labs    02/25/24 1810  PROBNP 57.4    CBG: Recent Labs  Lab 03/24/24 1628 03/24/24 2056 03/24/24 2247 03/25/24 0606 03/25/24 0809  GLUCAP 183* 314* 283* 180* 157*   Recent Results (from the past 240 hours)  Resp panel by RT-PCR (RSV, Flu A&B, Covid) Anterior Nasal Swab     Status: None   Collection Time: 03/23/24  8:44 AM   Specimen: Anterior Nasal Swab  Result Value Ref Range Status   SARS Coronavirus 2 by RT PCR NEGATIVE NEGATIVE Final   Influenza A by PCR NEGATIVE NEGATIVE Final   Influenza B by PCR NEGATIVE NEGATIVE Final    Comment: (NOTE) The Xpert Xpress SARS-CoV-2/FLU/RSV plus assay is intended as an aid in the diagnosis  of influenza from Nasopharyngeal swab specimens and should not be used as a sole basis for treatment. Nasal washings and aspirates are unacceptable for Xpert Xpress SARS-CoV-2/FLU/RSV testing.  Fact Sheet for Patients: bloggercourse.com  Fact Sheet for Healthcare Providers: seriousbroker.it  This test is not yet approved or cleared by the [place]  FDA and has been authorized for detection and/or diagnosis of SARS-CoV-2 by FDA under an Emergency Use Authorization (EUA). This EUA will remain in effect (meaning this test can be used) for the duration of the COVID-19 declaration under Section 564(b)(1) of the Act, 21 U.S.C. section 360bbb-3(b)(1), unless the authorization is terminated or revoked.     Resp Syncytial Virus by PCR NEGATIVE NEGATIVE Final    Comment: (NOTE) Fact Sheet for Patients: bloggercourse.com  Fact Sheet for Healthcare Providers: seriousbroker.it  This test is not yet  approved or cleared by the [place]  FDA and has been authorized for detection and/or diagnosis of SARS-CoV-2 by FDA under an Emergency Use Authorization (EUA). This EUA will remain in effect (meaning this test can be used) for the duration of the COVID-19 declaration under Section 564(b)(1) of the Act, 21 U.S.C. section 360bbb-3(b)(1), unless the authorization is terminated or revoked.  Performed at [facility], [address], [place], [place] [address]      Studies: No results found.     Carollyn Etcheverry, MD  [facility] 03/25/2024  If 7PM-7AM, please contact night-coverage

## 2024-03-25 NOTE — Plan of Care (Signed)
  Problem: Education: Goal: Ability to describe self-care measures that may prevent or decrease complications (Diabetes Survival Skills Education) will improve Outcome: Progressing   Problem: Coping: Goal: Ability to adjust to condition or change in health will improve Outcome: Progressing   Problem: Fluid Volume: Goal: Ability to maintain a balanced intake and output will improve Outcome: Progressing   Problem: Health Behavior/Discharge Planning: Goal: Ability to identify and utilize available resources and services will improve Outcome: Progressing Goal: Ability to manage health-related needs will improve Outcome: Progressing   Problem: Metabolic: Goal: Ability to maintain appropriate glucose levels will improve Outcome: Progressing   Problem: Nutritional: Goal: Maintenance of adequate nutrition will improve Outcome: Progressing Goal: Progress toward achieving an optimal weight will improve Outcome: Progressing   Problem: Skin Integrity: Goal: Risk for impaired skin integrity will decrease Outcome: Progressing   Problem: Tissue Perfusion: Goal: Adequacy of tissue perfusion will improve Outcome: Progressing   Problem: Education: Goal: Knowledge of General Education information will improve Description: Including pain rating scale, medication(s)/side effects and non-pharmacologic comfort measures Outcome: Progressing   Problem: Health Behavior/Discharge Planning: Goal: Ability to manage health-related needs will improve Outcome: Progressing   Problem: Clinical Measurements: Goal: Ability to maintain clinical measurements within normal limits will improve Outcome: Progressing Goal: Will remain free from infection Outcome: Progressing Goal: Diagnostic test results will improve Outcome: Progressing Goal: Respiratory complications will improve Outcome: Progressing Goal: Cardiovascular complication will be avoided Outcome: Progressing   Problem: Activity: Goal:  Risk for activity intolerance will decrease Outcome: Progressing   Problem: Nutrition: Goal: Adequate nutrition will be maintained Outcome: Progressing

## 2024-03-26 DIAGNOSIS — J9601 Acute respiratory failure with hypoxia: Secondary | ICD-10-CM | POA: Diagnosis not present

## 2024-03-26 LAB — CBC
HCT: 38 % (ref 36.0–46.0)
Hemoglobin: 12.8 g/dL (ref 12.0–15.0)
MCH: 32.1 pg (ref 26.0–34.0)
MCHC: 33.7 g/dL (ref 30.0–36.0)
MCV: 95.2 fL (ref 80.0–100.0)
Platelets: 228 K/uL (ref 150–400)
RBC: 3.99 MIL/uL (ref 3.87–5.11)
RDW: 13.9 % (ref 11.5–15.5)
WBC: 10.7 K/uL — ABNORMAL HIGH (ref 4.0–10.5)
nRBC: 0 % (ref 0.0–0.2)

## 2024-03-26 LAB — BASIC METABOLIC PANEL WITH GFR
Anion gap: 12 (ref 5–15)
BUN: 30 mg/dL — ABNORMAL HIGH (ref 8–23)
CO2: 24 mmol/L (ref 22–32)
Calcium: 8.8 mg/dL — ABNORMAL LOW (ref 8.9–10.3)
Chloride: 99 mmol/L (ref 98–111)
Creatinine, Ser: 1.21 mg/dL — ABNORMAL HIGH (ref 0.44–1.00)
GFR, Estimated: 47 mL/min — ABNORMAL LOW (ref >60)
Glucose, Bld: 206 mg/dL — ABNORMAL HIGH (ref 70–99)
Potassium: 4.3 mmol/L (ref 3.5–5.1)
Sodium: 135 mmol/L (ref 135–145)

## 2024-03-26 LAB — GLUCOSE, CAPILLARY
Glucose-Capillary: 280 mg/dL — ABNORMAL HIGH (ref 70–99)
Glucose-Capillary: 199 mg/dL — ABNORMAL HIGH (ref 70–99)
Glucose-Capillary: 187 mg/dL — ABNORMAL HIGH (ref 70–99)

## 2024-03-26 LAB — MAGNESIUM: Magnesium: 2.2 mg/dL (ref 1.7–2.4)

## 2024-03-26 MED ORDER — PREDNISONE 10 MG PO TABS: 30.0000 mg | ORAL_TABLET | Freq: Every day | ORAL | 0 refills | Status: AC

## 2024-03-26 MED ORDER — FLUTICASONE-SALMETEROL 115-21 MCG/ACT IN AERO: 2.0000 | INHALATION_SPRAY | Freq: Two times a day (BID) | RESPIRATORY_TRACT | 0 refills | Status: DC

## 2024-03-26 MED ORDER — TIOTROPIUM BROMIDE 18 MCG IN CAPS: 18.0000 ug | ORAL_CAPSULE | Freq: Every day | RESPIRATORY_TRACT | 0 refills | Status: DC

## 2024-03-26 MED ORDER — HYDROCOD POLI-CHLORPHE POLI ER 10-8 MG/5ML PO SUER: 5.0000 mL | Freq: Two times a day (BID) | ORAL | 0 refills | Status: AC | PRN

## 2024-03-26 NOTE — Plan of Care (Signed)

## 2024-03-26 NOTE — Progress Notes (Signed)
 Reviewed AVS, patient expressed understanding of medications, MD follow up reviewed.   Patient states all belongings brought to the hospital at time of admission are accounted for and packed to take home.   Patient informed and expressed understanding where to pick up discharge medications.   [facility] contacted to transport patient to entrance A,  where family member was waiting in vehicle to transport home.

## 2024-03-26 NOTE — TOC Transition Note (Signed)
 Transition of Care [facility]) - Discharge Note   Patient Details  Name: [name] MRN: [id number] Date of Birth: August 19, 1950  Transition of [facility]) CM/SW Contact:  Rosalva Jon Bloch, RN Phone Number: 03/26/2024, 10:58 AM   Clinical Narrative:    Patient will DC to: home Anticipated DC date: 03/26/2024 Family notified: yes Transport by: car     Shortness of breath and chest pain   Per MD patient ready for DC today. RN, patient notified of DC. Pt agreeable to nebulizer need. Pt without provider preference. Referral made with [employer] for DME : nebulizer. Equipment will be delivered to bedside prior to d/c. Pt without RX med concerns or transportation issues. Post hospital f/u noted on AVS.  RNCM will sign off for now as intervention is no longer needed. Please consult us  again if new needs arise.    Final next level of care: Home/Self Care     Patient Goals and CMS Choice     Choice offered to / list presented to : Patient      Discharge Placement                       Discharge Plan and Services Additional resources added to the After Visit Summary for                  DME Arranged: Nebulizer machine DME Agency: [employer] Date DME Agency Contacted: 03/26/24 Time DME Agency Contacted: 1057 Representative spoke with at DME Agency: Darlyn            Social Drivers of Health (SDOH) Interventions SDOH Screenings   Food Insecurity: No Food Insecurity (03/23/2024)  Housing: Low Risk  (03/23/2024)  Transportation Needs: Unmet Transportation Needs (03/23/2024)  Utilities: Not At Risk (03/23/2024)  Depression (PHQ2-9): Low Risk  (11/15/2022)  Recent Concern: Depression (PHQ2-9) - High Risk (09/05/2022)  Social Connections: Moderately Integrated (03/23/2024)  Tobacco Use: Medium Risk (03/23/2024)     Readmission Risk Interventions    08/14/2022   12:46 PM 08/22/2021   10:19 AM  Readmission Risk Prevention Plan  Transportation Screening Complete  Complete  HRI or Home Care Consult Complete   Social Work Consult for Recovery Care Planning/Counseling Complete   Palliative Care Screening Not Applicable   Medication Review Oceanographer) Referral to Pharmacy Complete  PCP or Specialist appointment within 3-5 days of discharge  Complete  HRI or Home Care Consult  Complete  SW Recovery Care/Counseling Consult  Complete  Palliative Care Screening  Not Applicable  Skilled Nursing Facility  Not Applicable

## 2024-03-26 NOTE — Discharge Summary (Addendum)
 Physician Discharge Summary  [name] FMW:[id number] DOB: [birth year] DOA: 03/23/2024  PCP: Arloa Elsie SAUNDERS, MD  Admit date: 03/23/2024 Discharge date: 03/26/2024  Admitted From: Home  Discharge disposition: Home   Recommendations for Outpatient Follow-Up:   Follow up with your primary care provider in one week.  Check CBC, BMP, magnesium  in the next visit Follow-up with pulmonary provider and has been scheduled.  Patient has been started on Advair and Spiriva  on discharge.  Please adjust as necessary in the next visit.  Discharge Diagnosis:   Active Problems:   Type 2 diabetes mellitus with hyperglycemia (HCC)   Essential hypertension   GERD (gastroesophageal reflux disease)   Asthma exacerbation   CAD in native artery   Seizure disorder (HCC)   Chronic diastolic CHF (congestive heart failure) (HCC)   Macrocytic anemia   Chronic kidney disease, stage 3a (HCC)   Anxiety   Obesity (BMI 30-39.9)   Major depression, single episode   Polyneuropathy due to type 2 diabetes mellitus (HCC)   Mild cognitive impairment   Laryngopharyngeal reflux (LPR)    Discharge Condition: Improved.  Diet recommendation: Low sodium, heart healthy.  Carbohydrate-modified..  Wound care: None.  Code status: Full.   History of Present Illness:   [name] is a 73 y.o. female with medical history significant of hypertension, CKD 3A, diabetes, neuropathy, LPR, GERD, CAD, diastolic CHF, mild cognitive impairment, depression, seizure disorder, low cortisol function, asthma, obesity, pericardial effusion, anemia, pulmonary embolism presented to hospital with 4-day history of worsening shortness of breath chest pain tightness.  Denies any fever or chills.  EMS was called in and received a Solu-Medrol  DuoNeb en route to the hospital.  In the ED, patient was on nonrebreather at 10 L/min and had significant wheezing.  Labs were notable for creatinine of 1.2 albumin  3.4.  Chest x-ray without  any infiltrate.  Patient received morphine  albuterol  budesonide  nebulizer and was admitted hospital for further evaluation and treatment.   Hospital Course:   Following conditions were addressed during hospitalization as listed below,  Asthma exacerbation with acute respiratory insufficiency Patient had 4-day history of shortness of breath and was hypoxic on presentation.  Received DuoNebs and Solu-Medrol  during hospitalization and subsequently was added on budesonide  inhaler.  Patient stated she had a history of vocal cord dysfunction in the past and had seen ENT.   ENT was consulted at this time and vocal cords were within normal range.  Speech therapy was consulted.  At this time wheezing has has improved.  Patient stated that she does have an appointment with Dr. Darlean pulmonary next week.  She has not been on maintenance inhalers so we will be started on Advair and Spiriva  on discharge.  She has albuterol  inhaler for rescue at home.  Will give a short course of prednisone  for next 3 days to complete 5-day course of parenteral steroids.  Patient was advised to avoid cold air and cold fluids if possible.  Pulmonary to adjust inhalers and other medications for asthma as necessary.  Hypertension Continue amlodipine , Coreg , Imdur , losartan .  Continue to monitor.   Diabetes melitis type II with hyperglycemia. Likely secondary to steroids.  Received sliding scale during hospitalization.  Hemoglobin A1c was 6.2.  Will give a short taper on discharge.  Advise diabetic diet and metformin  and oral medications to be continued on discharge.   GERD Continue PPI twice daily and as needed Pepcid .   CAD No acute issues.  Continue home ASA, Coreg , Imdur , losartan ,  atorvastatin    Chronic diastolic CHF Pericardial effusion Review of last 2D echo was in May 2024 and showed EF 60-65%, G1 DD, normal RV function.  Trivial pericardial effusion.  Not on diuretics.  Continue Coreg , losartan    Mild cognitive  impairment Continue Namenda    Depression On trazodone    Seizure disorder Continue Depakote    Anemia Latest hemoglobin of 12.1 and stable.   History of PE No acute issues.  Denies any chest pain.   Disposition.  At this time, patient is stable for disposition home with outpatient PCP and pulmonary follow-up  Medical Consultants:   None.  Procedures:    None Subjective:   Today, patient was seen and examined at bedside.  Still has some cough but significantly improved.  Mild wheezing.  No fever, chills, shortness of breath or chest pain.  Discharge Exam:   Vitals:   03/26/24 0820 03/26/24 0835  BP:  (!) 154/72  Pulse: 78 87  Resp: 18 16  Temp:    SpO2: 99% 100%   Vitals:   03/25/24 2052 03/26/24 0416 03/26/24 0820 03/26/24 0835  BP:  (!) 167/80  (!) 154/72  Pulse:  77 78 87  Resp:  17 18 16   Temp:  98.4 F (36.9 C)    TempSrc:      SpO2: 100% 99% 99% 100%  Weight:      Height:      Body mass index is 27.07 kg/m.   General: Alert awake, not in obvious distress, on room air, HENT: pupils equally reacting to light,  No scleral pallor or icterus noted. Oral mucosa is moist.  Chest: Mild expiratory wheezes but significantly improved CVS: S1 &S2 heard. No murmur.  Regular rate and rhythm. Abdomen: Soft, nontender, nondistended.  Bowel sounds are heard.   Extremities: No cyanosis, clubbing or edema.  Peripheral pulses are palpable. Psych: Alert, awake and oriented, normal mood CNS:  No cranial nerve deficits.  Power equal in all extremities.   Skin: Warm and dry.  No rashes noted.  The results of significant diagnostics from this hospitalization (including imaging, microbiology, ancillary and laboratory) are listed below for reference.     Diagnostic Studies:   DG Chest Port 1 View Result Date: 03/23/2024 EXAM: 1 VIEW(S) XRAY OF THE CHEST 03/23/2024 09:03:50 AM COMPARISON: 10/27//25 CLINICAL HISTORY: SOB FINDINGS: LUNGS AND PLEURA: No focal pulmonary  opacity. No pleural effusion. No pneumothorax. HEART AND MEDIASTINUM: No acute abnormality of the cardiac and mediastinal silhouettes. BONES AND SOFT TISSUES: No acute osseous abnormality. IMPRESSION: 1. No acute cardiopulmonary process. Electronically signed by: Waddell Calk MD 03/23/2024 09:10 AM EST RP Workstation: [id number]     Labs:   Basic Metabolic Panel: Recent Labs  Lab 03/23/24 0844 03/24/24 0539 03/25/24 0519 03/26/24 0459  NA 140 138 139 135  K 3.7 4.3 4.2 4.3  CL 101 101 102 99  CO2 24 29 24 24   GLUCOSE 210* 154* 244* 206*  BUN 19 22 30* 30*  CREATININE 1.21* 1.03* 1.14* 1.21*  CALCIUM  8.7* 8.9 8.9 8.8*  MG  --   --  2.1 2.2   GFR Estimated Creatinine Clearance: 36.1 mL/min (A) (by C-G formula based on SCr of 1.21 mg/dL (H)). Liver Function Tests: Recent Labs  Lab 03/23/24 0844 03/24/24 0539  AST 23 21  ALT 14 18  ALKPHOS 52 54  BILITOT 1.0 0.8  PROT 6.3* 6.7  ALBUMIN  3.4* 3.4*   No results for input(s): LIPASE, AMYLASE in the last 168 hours. No results  for input(s): AMMONIA in the last 168 hours. Coagulation profile No results for input(s): INR, PROTIME in the last 168 hours.  CBC: Recent Labs  Lab 03/23/24 0844 03/24/24 0539 03/25/24 0519 03/26/24 0459  WBC 8.0 9.2 8.3 10.7*  NEUTROABS 4.3  --   --   --   HGB 11.4* 12.1 12.2 12.8  HCT 35.3* 35.6* 36.6 38.0  MCV 99.2 94.4 96.1 95.2  PLT 188 186 204 228   Cardiac Enzymes: No results for input(s): CKTOTAL, CKMB, CKMBINDEX, TROPONINI in the last 168 hours. BNP: Invalid input(s): POCBNP CBG: Recent Labs  Lab 03/25/24 1151 03/25/24 1559 03/25/24 2006 03/26/24 0556 03/26/24 0755  GLUCAP 216* 292* 280* 199* 187*   D-Dimer No results for input(s): DDIMER in the last 72 hours. Hgb A1c Recent Labs    03/25/24 1222  HGBA1C 6.2*   Lipid Profile No results for input(s): CHOL, HDL, LDLCALC, TRIG, CHOLHDL, LDLDIRECT in the last 72 hours. Thyroid   function studies No results for input(s): TSH, T4TOTAL, T3FREE, THYROIDAB in the last 72 hours.  Invalid input(s): FREET3 Anemia work up No results for input(s): VITAMINB12, FOLATE, FERRITIN, TIBC, IRON, RETICCTPCT in the last 72 hours. Microbiology Recent Results (from the past 240 hours)  Resp panel by RT-PCR (RSV, Flu A&B, Covid) Anterior Nasal Swab     Status: None   Collection Time: 03/23/24  8:44 AM   Specimen: Anterior Nasal Swab  Result Value Ref Range Status   SARS Coronavirus 2 by RT PCR NEGATIVE NEGATIVE Final   Influenza A by PCR NEGATIVE NEGATIVE Final   Influenza B by PCR NEGATIVE NEGATIVE Final    Comment: (NOTE) The Xpert Xpress SARS-CoV-2/FLU/RSV plus assay is intended as an aid in the diagnosis of influenza from Nasopharyngeal swab specimens and should not be used as a sole basis for treatment. Nasal washings and aspirates are unacceptable for Xpert Xpress SARS-CoV-2/FLU/RSV testing.  Fact Sheet for Patients: bloggercourse.com  Fact Sheet for Healthcare Providers: seriousbroker.it  This test is not yet approved or cleared by the [place]  FDA and has been authorized for detection and/or diagnosis of SARS-CoV-2 by FDA under an Emergency Use Authorization (EUA). This EUA will remain in effect (meaning this test can be used) for the duration of the COVID-19 declaration under Section 564(b)(1) of the Act, 21 U.S.C. section 360bbb-3(b)(1), unless the authorization is terminated or revoked.     Resp Syncytial Virus by PCR NEGATIVE NEGATIVE Final    Comment: (NOTE) Fact Sheet for Patients: bloggercourse.com  Fact Sheet for Healthcare Providers: seriousbroker.it  This test is not yet approved or cleared by the [place]  FDA and has been authorized for detection and/or diagnosis of SARS-CoV-2 by FDA under an Emergency Use Authorization  (EUA). This EUA will remain in effect (meaning this test can be used) for the duration of the COVID-19 declaration under Section 564(b)(1) of the Act, 21 U.S.C. section 360bbb-3(b)(1), unless the authorization is terminated or revoked.  Performed at [facility], [address] [place], [place] [address]      Discharge Instructions:   Discharge Instructions     Call MD for:  temperature >100.4   Complete by: As directed    Diet general   Complete by: As directed    Discharge instructions   Complete by: As directed    Follow up with your primary care physician in one week. Follow up with Pulmonary physician Dr Darlean in one week as has been scheduled. Avoid cold fluids or air. Do  not overexert.   For home use only DME Nebulizer machine   Complete by: As directed    Patient needs a nebulizer to treat with the following condition: Asthma attack   Length of Need: Lifetime   Additional equipment included:  Administration kit Filter     Increase activity slowly   Complete by: As directed       Allergies as of 03/26/2024       Reactions   Tramadol  Other (See Comments)   Not to be taken because of existing seizure disorder/SEIZURES   Chlorthalidone  Other (See Comments)   Was stopped due to kidney function        Medication List     TAKE these medications    albuterol  108 (90 Base) MCG/ACT inhaler Commonly known as: ProAir  HFA Inhale 2 puffs into the lungs every 4 (four) hours as needed for wheezing or shortness of breath.   amLODipine  10 MG tablet Commonly known as: NORVASC  Take 1 tablet (10 mg total) by mouth daily.   aspirin  81 MG chewable tablet Chew 1 tablet (81 mg total) by mouth daily.   atorvastatin  80 MG tablet Commonly known as: LIPITOR  Take 1 tablet (80 mg total) by mouth daily.   Biotin  5000 MCG Caps Take 5,000 mcg by mouth daily.   carvedilol  25 MG tablet Commonly known as: COREG  Take 1 tablet (25 mg total) by mouth 2 (two) times  daily.   chlorpheniramine -HYDROcodone  10-8 MG/5ML Commonly known as: TUSSIONEX Take 5 mLs by mouth every 12 (twelve) hours as needed for up to 5 days for cough.   Compressor/Nebulizer Misc 1 each by Does not apply route every 4 (four) hours as needed.   divalproex  500 MG 24 hr tablet Commonly known as: DEPAKOTE  ER Take 1 tablet (500 mg total) by mouth at bedtime.   EQ Probiotic Caps Take 1 capsule by mouth daily.   famotidine  20 MG tablet Commonly known as: PEPCID  Take 20 mg by mouth at bedtime as needed.   ferrous sulfate  325 (65 FE) MG tablet Take 325 mg by mouth daily with breakfast.   fluticasone  50 MCG/ACT nasal spray Commonly known as: FLONASE  Place 1 spray into both nostrils daily.   fluticasone -salmeterol 115-21 MCG/ACT inhaler Commonly known as: Advair HFA Inhale 2 puffs into the lungs 2 (two) times daily.   ipratropium-albuterol  0.5-2.5 (3) MG/3ML Soln Commonly known as: DUONEB Take 3 mLs by nebulization every 4 (four) hours as needed.   isosorbide  mononitrate 30 MG 24 hr tablet Commonly known as: IMDUR  Take 1 tablet (30 mg total) by mouth daily.   [name]  10 MG Tabs tablet Generic drug: empagliflozin  Take 1 tablet by mouth once daily   latanoprost  0.005 % ophthalmic solution Commonly known as: XALATAN  Place 1 drop into both eyes at bedtime.   losartan  25 MG tablet Commonly known as: COZAAR  Take 1 tablet (25 mg total) by mouth daily.   magnesium  oxide 400 (240 Mg) MG tablet Commonly known as: MAG-OX Take 1 tablet (400 mg total) by mouth daily.   memantine  10 MG tablet Commonly known as: NAMENDA  Take 1 tablet (10 mg total) by mouth 2 (two) times daily.   metFORMIN  1000 MG tablet Commonly known as: GLUCOPHAGE  Take 1,000 mg by mouth 2 (two) times daily with a meal.   montelukast  10 MG tablet Commonly known as: SINGULAIR  TAKE 1 TABLET BY MOUTH AT  BEDTIME   nitroGLYCERIN  0.4 MG SL tablet Commonly known as: NITROSTAT  Place 1 tablet (0.4 mg  total) under  the tongue every 5 (five) minutes as needed for chest pain. Don't exceed more than 3 doses in 15 mins   ONE-A-DAY WOMENS PO Take 1 tablet by mouth daily with breakfast.   pantoprazole  40 MG tablet Commonly known as: PROTONIX  Take 1 tablet (40 mg total) by mouth 2 (two) times daily before a meal. What changed: when to take this   predniSONE  10 MG tablet Commonly known as: DELTASONE  Take 3 tablets (30 mg total) by mouth daily with breakfast for 3 days.   Tiotropium Bromide  18 MCG Caps Commonly known as: Spiriva  HandiHaler Place 18 mcg into inhaler and inhale daily.   tiZANidine  2 MG tablet Commonly known as: ZANAFLEX  Take 2 mg by mouth every 8 (eight) hours as needed.   traZODone  50 MG tablet Commonly known as: DESYREL  Take 50 mg by mouth at bedtime as needed.               Durable Medical Equipment  (From admission, onward)           Start     Ordered   03/26/24 0000  For home use only DME Nebulizer machine       Question Answer Comment  Patient needs a nebulizer to treat with the following condition Asthma attack   Length of Need Lifetime   Additional equipment included Administration kit   Additional equipment included Filter      03/26/24 1049            Follow-up Information     Arloa Elsie SAUNDERS, MD Follow up.   Specialty: Family Medicine Contact information: [phone] [address] [place] [address] [phone]                  Time coordinating discharge: 39 minutes  Signed:  Kanoe Wanner  [facility] 03/26/2024, 1:07 PM

## 2024-03-26 NOTE — Progress Notes (Signed)
    Durable Medical Equipment  (From admission, onward)           Start     Ordered   03/26/24 0000  For home use only DME Nebulizer machine       Question Answer Comment  Patient needs a nebulizer to treat with the following condition Asthma attack   Length of Need Lifetime   Additional equipment included Administration kit   Additional equipment included Filter      03/26/24 1049

## 2024-04-01 ENCOUNTER — Encounter: Payer: Self-pay | Admitting: Cardiology

## 2024-04-01 ENCOUNTER — Ambulatory Visit: Attending: Cardiology | Admitting: Cardiology

## 2024-04-01 VITALS — BP 148/72 | HR 78 | Ht 62.0 in | Wt 147.0 lb

## 2024-04-01 DIAGNOSIS — I2510 Atherosclerotic heart disease of native coronary artery without angina pectoris: Secondary | ICD-10-CM

## 2024-04-01 DIAGNOSIS — I10 Essential (primary) hypertension: Secondary | ICD-10-CM

## 2024-04-01 DIAGNOSIS — E785 Hyperlipidemia, unspecified: Secondary | ICD-10-CM | POA: Diagnosis not present

## 2024-04-01 NOTE — Patient Instructions (Signed)
 Medication Instructions:   Your physician recommends that you continue on your current medications as directed. Please refer to the Current Medication list given to you today.   *If you need a refill on your cardiac medications before your next appointment, please call your pharmacy*   Lab Work: NONE ORDERED  TODAY   If you have labs (blood work) drawn today and your tests are completely normal, you will receive your results only by: MyChart Message (if you have MyChart) OR A paper copy in the mail If you have any lab test that is abnormal or we need to change your treatment, we will call you to review the results.   Testing/Procedures:  NONE ORDERED  TODAY    Follow-Up: At [facility], you and your health needs are our priority.  As part of our continuing mission to provide you with exceptional heart care, our providers are all part of one team.  This team includes your primary Cardiologist (physician) and Advanced Practice Providers or APPs (Physician Assistants and Nurse Practitioners) who all work together to provide you with the care you need, when you need it.  Your next appointment:   6 month(s)   Provider:  Kaitlyn West  PA-C      We recommend signing up for the patient portal called MyChart.  Sign up information is provided on this After Visit Summary.  MyChart is used to connect with patients for Virtual Visits (Telemedicine).  Patients are able to view lab/test results, encounter notes, upcoming appointments, etc.  Non-urgent messages can be sent to your provider as well.   To learn more about what you can do with MyChart, go to [email or web address].   Other Instructions

## 2024-04-01 NOTE — Progress Notes (Signed)
 Cardiology Office Note:  .   Date:  04/01/2024  ID:  JORDANNE ELSBURY, DOB [birth year], MRN [id number] PCP: Arloa Elsie SAUNDERS, MD  [facility] Providers Cardiologist:  Oneil Parchment, MD Cardiology APP:  Devora Rosabel BIRCH, NP    History of Present Illness: .   [name] is a 73 y.o. female Discussed the use of AI scribe  History of Present Illness [name] is a 73 year old female with asthma who presents for follow-up after a recent asthma exacerbation requiring hospitalization.  In November 2025, she experienced an asthma exacerbation that resulted in hypoxia and necessitated hospitalization. During her hospital stay, she was treated with inhalers and a short course of prednisone . Upon discharge, she was prescribed maintenance inhalers, including Advair and Spiriva . She reports difficulty using the Advair inhaler due to not understanding its operation, stating 'I haven't figured out how to use it yet.' Since returning home, she has been using Spiriva  and a nebulizer. An ENT consultation showed normal vocal cords, and she was referred to speech therapy. A pulmonary appointment was also scheduled.  Her hypertension is managed with amlodipine , Coreg , Imdur , and losartan . She has a history of type 2 diabetes with a recent hemoglobin A1c of 6.2. During her hospital stay, she received a sliding scale insulin  regimen.  She has coronary artery disease and underwent cardiac catheterization on August 10, 2022, receiving two drug-eluting stents in the right coronary artery following an inferior STEMI. She is currently on atorvastatin  and aspirin  81 mg. A past echocardiogram showed an ejection fraction of 65% and trivial pericardial effusion. She is not on diuretics.  She has a seizure disorder managed with Depakote  and depression treated with trazodone . Her anemia is stable with a hemoglobin level of 12.1. She has a history of pulmonary embolism with no acute issues currently. A recent chest X-ray  was unremarkable, and her creatinine level is 1.21. She tested negative for COVID-19.  She underwent a 72-hour ambulatory video EEG at home for seizure monitoring, but has not yet received the results.       Studies Reviewed: .        Results LABS Hemoglobin A1c: 6.2 (03/25/2024) Hemoglobin: 12.1 Creatinine: 1.21  RADIOLOGY Chest X-ray: Unremarkable  DIAGNOSTIC Echocardiogram: EF 65%, grade 1 diastolic dysfunction, trivial pericardial effusion (08/2022) Cardiac Catheterization: Drug-eluting stent in right coronary artery (08/10/2022) Risk Assessment/Calculations:           Physical Exam:   VS:  BP (!) 148/72   Pulse 78   Ht 5' 2 (1.575 m)   Wt 147 lb (66.7 kg)   LMP  (LMP Unknown)   SpO2 96%   BMI 26.89 kg/m    Wt Readings from Last 3 Encounters:  04/01/24 147 lb (66.7 kg)  03/23/24 148 lb (67.1 kg)  02/26/24 139 lb (63 kg)    GEN: Well nourished, well developed in no acute distress NECK: No JVD; No carotid bruits CARDIAC: RRR, no murmurs, no rubs, no gallops RESPIRATORY:  Clear to auscultation without rales, wheezing or rhonchi  ABDOMEN: Soft, non-tender, non-distended EXTREMITIES:  No edema; No deformity   ASSESSMENT AND PLAN: .    Assessment and Plan Assessment & Plan Asthma with recent exacerbation Recent exacerbation with hypoxia requiring hospitalization in November 2025. Inhalers were utilized during hospitalization. Vocal cords were normal upon ENT consultation. Speech therapy was consulted. Pulmonary appointment was made. Maintenance inhalers with Advair and Spiriva  were placed at discharge. Short course of prednisone  was administered. Difficulty  using Advair inhaler at home. Spiriva  and nebulizer have been used effectively. - Continue Spiriva  and nebulizer as needed. - Consult pharmacy for assistance with Advair inhaler usage. - Consider YouTube tutorials for Advair inhaler usage.  Coronary artery disease, status post right coronary artery  stenting and inferior STEMI Status post right coronary artery stenting and inferior STEMI in April 2024. No acute issues currently. Heart function is well-managed with good ejection fraction. On aspirin  and atorvastatin  for secondary prevention. - Continue aspirin  81 mg daily. - Continue atorvastatin  80 mg daily.   Chronic diastolic heart failure with trivial pericardial effusion Chronic diastolic heart failure with trivial pericardial effusion. Last echocardiogram in May 2024 showed EF of 65% with grade one diastolic dysfunction. No diuretics currently prescribed. - Continue current heart failure management regimen.  Type 2 diabetes mellitus Recent hemoglobin A1c of 6.2, indicating good control. - Continue current diabetes management regimen.  Hyperlipidemia Managed with atorvastatin .  Has upcoming appointment with lipid clinic pharmacy team.  Hypertension Managed with amlodipine , Coreg , Imdur , and losartan . - Continue current antihypertensive regimen.  Seizure disorder Managed with Depakote . Recent 72-hour ambulatory video EEG performed at home. - Follow up with neurologist for EEG results.  Depression Managed with trazodone . - Continue current depression management regimen.  Anemia Hemoglobin of 12.1, stable.         Dispo: 6 months, West, NP  Signed, Oneil Parchment, MD

## 2024-04-02 ENCOUNTER — Other Ambulatory Visit (HOSPITAL_COMMUNITY): Payer: Self-pay

## 2024-04-02 ENCOUNTER — Telehealth: Payer: Self-pay | Admitting: Neurology

## 2024-04-02 ENCOUNTER — Telehealth: Payer: Self-pay | Admitting: Pharmacy Technician

## 2024-04-02 ENCOUNTER — Ambulatory Visit: Admitting: Pharmacist

## 2024-04-02 ENCOUNTER — Telehealth: Payer: Self-pay | Admitting: Pharmacist

## 2024-04-02 NOTE — Telephone Encounter (Signed)
   Pharmacy Patient Advocate Encounter   Received notification from Pt Calls Messages that prior authorization for REPATHA is required/requested.   Insurance verification completed.   The patient is insured through [facility] ADVANTAGE/RX ADVANCE.   Per test claim: PA required; PA submitted to above mentioned insurance via Latent Key/confirmation #/EOC [id number] Status is pending

## 2024-04-02 NOTE — Progress Notes (Deleted)
 Patient ID: [name]                 DOB: 09/25/1950                    MRN: [id number]      HPI: [name] is a 73 y.o. female patient referred to lipid clinic by Katlyn West, NP. PMH is significant for CAD, hx of STEMI and NSTEMI, hypertension, CHF, Asthma, T2DM, CKD stage 3a, Obesity.    Reviewed options for lowering LDL cholesterol, including ezetimibe, PCSK-9 inhibitors, bempedoic acid and inclisiran.  Discussed mechanisms of action, dosing, side effects and potential decreases in LDL cholesterol.  Also reviewed cost information and potential options for patient assistance.  Current Medications: atorvastatin  80 mg daily  Intolerances: none  Risk Factors: CKDstage 3b, T2DM, CAD hx of STEMI, hypertension, CHF, family hx of CAD  LDL goal: <55 mg/dl  Last lab 90/97/7974 - TC 172, TG 97, HDLc 69, LDLc 86  Diet:   Exercise:   Family History:  Relation Problem Comments  Mother (Deceased) Allergies   Asthma     Father (Deceased) CAD (Age: 14)   Diabetes   Hypertension     Sister (Deceased) Congestive Heart Failure died in her 68s    Sister - 2 Metallurgist) CAD (Age: 17)     Brother - 2 (Alive)   Maternal Grandmother (Deceased)   Maternal Grandfather (Deceased)   Paternal Grandmother (Deceased)   Paternal Grandfather (Deceased)      Social History:   Labs:  Lipid Panel     Component Value Date/Time   CHOL 172 01/01/2024 0954   TRIG 97 01/01/2024 0954   HDL 69 01/01/2024 0954   CHOLHDL 2.5 01/01/2024 0954   CHOLHDL 2.7 08/12/2022 0035   VLDL 38 08/12/2022 0035   LDLCALC 86 01/01/2024 0954   LABVLDL 17 01/01/2024 0954    Past Medical History:  Diagnosis Date   Acute renal failure superimposed on stage 3b chronic kidney disease (HCC) 07/17/2016   Anxiety    Arthritis    Asthma    Chronic diastolic CHF (congestive heart failure) (HCC) 07/17/2016   Coronary artery disease    a. NSTEMI 12/2015 - LHC 01/18/16: s/p overlapping DESx2 to RCA, 10% ost-prox Cx,10%  mLAD.   Dementia (HCC)    Depression    Diabetes mellitus    Dyslipidemia    GERD (gastroesophageal reflux disease)    History of seizure    Hyperlipidemia    Hypertension    Hypertensive heart disease    NSTEMI (non-ST elevated myocardial infarction) (HCC) 12/2015   Stage 3 chronic kidney disease (HCC)     Current Outpatient Medications on File Prior to Visit  Medication Sig Dispense Refill   albuterol  (PROAIR  HFA) 108 (90 Base) MCG/ACT inhaler Inhale 2 puffs into the lungs every 4 (four) hours as needed for wheezing or shortness of breath. 18 g 2   amLODipine  (NORVASC ) 10 MG tablet Take 1 tablet (10 mg total) by mouth daily. 90 tablet 3   aspirin  81 MG chewable tablet Chew 1 tablet (81 mg total) by mouth daily. 90 tablet 2   atorvastatin  (LIPITOR ) 80 MG tablet Take 1 tablet (80 mg total) by mouth daily. 90 tablet 1   Biotin  5000 MCG CAPS Take 5,000 mcg by mouth daily.     carvedilol  (COREG ) 25 MG tablet Take 1 tablet (25 mg total) by mouth 2 (two) times daily. 180 tablet 3   divalproex  (  DEPAKOTE  ER) 500 MG 24 hr tablet Take 1 tablet (500 mg total) by mouth at bedtime. 90 tablet 3   empagliflozin  ([name] ) 10 MG TABS tablet Take 1 tablet by mouth once daily 90 tablet 1   famotidine  (PEPCID ) 20 MG tablet Take 20 mg by mouth at bedtime as needed.     ferrous sulfate  325 (65 FE) MG tablet Take 325 mg by mouth daily with breakfast.     fluticasone  (FLONASE ) 50 MCG/ACT nasal spray Place 1 spray into both nostrils daily. 16 g 2   fluticasone -salmeterol (ADVAIR HFA) 115-21 MCG/ACT inhaler Inhale 2 puffs into the lungs 2 (two) times daily. 1 each 0   ipratropium-albuterol  (DUONEB) 0.5-2.5 (3) MG/3ML SOLN Take 3 mLs by nebulization every 4 (four) hours as needed. 360 mL 1   isosorbide  mononitrate (IMDUR ) 30 MG 24 hr tablet Take 1 tablet (30 mg total) by mouth daily. 90 tablet 3   latanoprost  (XALATAN ) 0.005 % ophthalmic solution Place 1 drop into both eyes at bedtime.     losartan  (COZAAR )  25 MG tablet Take 1 tablet (25 mg total) by mouth daily. 90 tablet 3   magnesium  oxide (MAG-OX) 400 (240 Mg) MG tablet Take 1 tablet (400 mg total) by mouth daily. 60 tablet 0   memantine  (NAMENDA ) 10 MG tablet Take 1 tablet (10 mg total) by mouth 2 (two) times daily. 180 tablet 3   metFORMIN  (GLUCOPHAGE ) 1000 MG tablet Take 1,000 mg by mouth 2 (two) times daily with a meal.     montelukast  (SINGULAIR ) 10 MG tablet TAKE 1 TABLET BY MOUTH AT  BEDTIME 90 tablet 3   Multiple Vitamins-Minerals (ONE-A-DAY WOMENS PO) Take 1 tablet by mouth daily with breakfast.     Nebulizers (COMPRESSOR/NEBULIZER) MISC 1 each by Does not apply route every 4 (four) hours as needed. 1 each 0   nitroGLYCERIN  (NITROSTAT ) 0.4 MG SL tablet Place 1 tablet (0.4 mg total) under the tongue every 5 (five) minutes as needed for chest pain. Don't exceed more than 3 doses in 15 mins 30 tablet 0   pantoprazole  (PROTONIX ) 40 MG tablet Take 1 tablet (40 mg total) by mouth 2 (two) times daily before a meal. (Patient taking differently: Take 40 mg by mouth daily.) 60 tablet 0   Probiotic Product (EQ PROBIOTIC) CAPS Take 1 capsule by mouth daily.     Tiotropium Bromide  (SPIRIVA  HANDIHALER) 18 MCG CAPS Place 18 mcg into inhaler and inhale daily. 30 capsule 0   tiZANidine  (ZANAFLEX ) 2 MG tablet Take 2 mg by mouth every 8 (eight) hours as needed.     traZODone  (DESYREL ) 50 MG tablet Take 50 mg by mouth at bedtime as needed.     No current facility-administered medications on file prior to visit.    Allergies  Allergen Reactions   Tramadol  Other (See Comments)    Not to be taken because of existing seizure disorder/SEIZURES    Chlorthalidone  Other (See Comments)    Was stopped due to kidney function    Assessment/Plan:  1. Hyperlipidemia -  No problems updated. No problem-specific Assessment & Plan notes found for this encounter.    Thank you,  Robbi Blanch, Pharm.D Graves Elspeth BIRCH. [facility] [address], [place], [place] [address] Phone: [phone]; Fax: [phone]

## 2024-04-02 NOTE — Telephone Encounter (Signed)
 PA for Repatha approved, patient rescheduled today's appointment

## 2024-04-02 NOTE — Telephone Encounter (Signed)
 Pt asking to be called with results to her EEG

## 2024-04-02 NOTE — Telephone Encounter (Signed)
 Pharmacy Patient Advocate Encounter   Received notification from [facility] ADVANTAGE/RX ADVANCE that Prior Authorization for REPATHA has been APPROVED from 04/02/24 to 09/29/24. Ran test claim, Copay is $0.00. This test claim was processed through [facility]- copay amounts may vary at other pharmacies due to pharmacy/plan contracts, or as the patient moves through the different stages of their insurance plan.    PA #/Case ID/Reference #: [id number]

## 2024-04-03 ENCOUNTER — Encounter: Payer: Self-pay | Admitting: Neurology

## 2024-04-03 DIAGNOSIS — N179 Acute kidney failure, unspecified: Secondary | ICD-10-CM | POA: Diagnosis not present

## 2024-04-03 DIAGNOSIS — D72829 Elevated white blood cell count, unspecified: Secondary | ICD-10-CM | POA: Diagnosis not present

## 2024-04-03 DIAGNOSIS — R569 Unspecified convulsions: Secondary | ICD-10-CM

## 2024-04-03 MED ORDER — DIVALPROEX SODIUM ER 250 MG PO TB24: 250.0000 mg | ORAL_TABLET | Freq: Every day | ORAL | 3 refills | Status: AC

## 2024-04-03 NOTE — Telephone Encounter (Signed)
 I called the patient. 72 hour EEG showed presence of sharply contoured waves in left frontal region, concern for seizure potential.  Given this as well as recent hospitalization with questionable seizure activity, we will increase Depakote  ER from 500 mg to 750 mg daily.   Meds ordered this encounter  Medications   divalproex  (DEPAKOTE  ER) 250 MG 24 hr tablet    Sig: Take 1 tablet (250 mg total) by mouth at bedtime. To be taken with the Depakote  500 mg for a total of 750 mg at bedtime    Dispense:  90 tablet    Refill:  3

## 2024-04-03 NOTE — Telephone Encounter (Signed)
 EEG completed. Thanks

## 2024-04-03 NOTE — Procedures (Signed)
 Patient Name: [name]  MRN: [id number]  Referring Physician/Provider: [place] Study start date: 03/17/2024 at 1007 AM Study end date: 03/20/2024 at 0902 AM Duration: 71 hours    Clinical History:   58Y Female, left-handed, presents for episodes of confusion and spacing out which last up to 5 minutes over the last few months. PMH of type 2 diabetes, hypertension, hyperlipidemia, pulmonary emboli, depression, COPD, coronary artery disease and stent placement following a heart attack on January 18, 2016, to which the ejection fraction was 65-70%. Also, severe concentric hypertrophy, septal wall thickness increased, mild atrophy of posterior wall, abnormal relaxation, increased filling pressure.   INTERMITTENT MONITORING with VIDEO TECHNICAL SUMMARY:  This AVEEG was performed using equipment provided by [employer] utilizing Bluetooth ( Trackit ) amplifiers with continuous EEGT attended video collection using encrypted remote transmission via [employer] secured cellular tower network with data rates for each AVEEG performed. This is a therapist, music AVEEG, obtained, according to the 10-20 international electrode placement system, reformatted digitally into referential and bipolar montages. Data was acquired with a minimum of 21 bipolar connections and sampled at a minimum rate of 250 cycles per second per channel, maximum rate of 450 cycles per second per channel and two channels for EKG. The entire VEEG study was recorded through cable and or radio telemetry for subsequent analysis. Specified epochs of the AVEEG data were identified at the direction of the subject by the depression of a push button by the patient. Each patients event file included data acquired two minutes prior to the push button activation and continuing until two minutes afterwards. AVEEG files were reviewed on [employer] server, Licensed Software provided by [employer] with a digital high frequency  filter set at 70 Hz and a low frequency filter set at 1 Hz with a paper speed of 42mm/s resulting in 10 seconds per digital page. This entire AVEEG was reviewed by the EEG Technologist. Random time samples, random sleep samples, clips, patient initiated push button files with included patient daily diary logs, EEG Technologist pruned data was reviewed and verified for accuracy and validity by the governing reading neurologist in full details. This AEEGV was fully compliant with all requirements for CPT 97500 for setup, patient education, take down and administered by an EEG technologist.   Long-Term EEG with Video was monitored intermittently by a qualified EEG technologist for the entirety of the recording; quality check-ins were performed at a minimum of every two hours, checking and documenting real-time data and video to assure the integrity and quality of the recording (e.g., camera position, electrode integrity and impedance), and identify the need for maintenance. For intermittent monitoring, an EEG Technologist monitored no more than 12 patients concurrently. Diagnostic video was captured at least 80% of the time during the recording.   PATIENT EVENTS:  There were no patient events noted or captured during this recording.   TECHNOLOGIST EVENTS:  No clear epileptiform activity was detected by the reviewing Neurodiagnostic Technologist, but there were presence of sharply contoured waves in the left frontal region   TIME SAMPLES:  10-minutes of every 2 hours recorded are reviewed as random time samples.   SLEEP SAMPLES:  5-minutes of every 24 hours recorded are reviewed as random sleep samples.   AWAKE:  At maximal level of alertness, the posterior dominant background activity was continuous, reactive, low voltage rhythm of 9 Hz. This was symmetric, well-modulated, and attenuated with eye opening. Diffuse, symmetric, frontocentral beta range activity was present.  SLEEP:  N1 Sleep (Stage 1)  was observed and characterized by the disappearance of alpha rhythm and the appearance of vertex activity.   N2 Sleep (Stage 2) was observed and characterized by vertex waves, K-complexes, and sleep spindles.   N3 (Stage 3) sleep was observed and characterized by high amplitude Delta activity of 20%.   REM sleep was observed.   EKG:  There were no arrhythmias or abnormalities noted during this recording.   Impression:  Normal EEG: awake and asleep   Clinical Correlation: This is a normal 3-day ambulatory EEG tracing. No focal abnormalities or epileptiform discharges were seen, but there were presence of sharply contoured waves in the left frontal region. There were no electrographic seizures noted. No events were captured during the recording. Please note a normal EEG does not exclude the diagnosis of epileps   Pastor Falling, MD [facility]

## 2024-04-08 ENCOUNTER — Encounter: Payer: Self-pay | Admitting: Adult Health

## 2024-04-08 ENCOUNTER — Ambulatory Visit: Admitting: Adult Health

## 2024-04-08 ENCOUNTER — Other Ambulatory Visit: Payer: Self-pay | Admitting: Adult Health

## 2024-04-08 VITALS — BP 122/60 | HR 74 | Temp 98.3°F | Ht 62.0 in | Wt 153.8 lb

## 2024-04-08 DIAGNOSIS — J4550 Severe persistent asthma, uncomplicated: Secondary | ICD-10-CM

## 2024-04-08 LAB — CBC WITH DIFFERENTIAL/PLATELET
Basophils Absolute: 0.1 K/uL (ref 0.0–0.1)
Basophils Relative: 1.1 % (ref 0.0–3.0)
Eosinophils Absolute: 0.1 K/uL (ref 0.0–0.7)
Eosinophils Relative: 1.1 % (ref 0.0–5.0)
HCT: 36.6 % (ref 36.0–46.0)
Hemoglobin: 12.3 g/dL (ref 12.0–15.0)
Lymphocytes Relative: 22.6 % (ref 12.0–46.0)
Lymphs Abs: 1.6 K/uL (ref 0.7–4.0)
MCHC: 33.6 g/dL (ref 30.0–36.0)
MCV: 96.5 fl (ref 78.0–100.0)
Monocytes Absolute: 0.6 K/uL (ref 0.1–1.0)
Monocytes Relative: 7.8 % (ref 3.0–12.0)
Neutro Abs: 4.8 K/uL (ref 1.4–7.7)
Neutrophils Relative %: 67.4 % (ref 43.0–77.0)
Platelets: 253 K/uL (ref 150.0–400.0)
RBC: 3.79 Mil/uL — ABNORMAL LOW (ref 3.87–5.11)
RDW: 15.2 % (ref 11.5–15.5)
WBC: 7.2 K/uL (ref 4.0–10.5)

## 2024-04-08 MED ORDER — FLUTICASONE-SALMETEROL 115-21 MCG/ACT IN AERO: 2.0000 | INHALATION_SPRAY | Freq: Two times a day (BID) | RESPIRATORY_TRACT | 5 refills | Status: AC
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
## 2024-04-08 NOTE — Patient Instructions (Addendum)
 Continue on Advair 2 puffs Twice daily , rinse after use.  Remain off Spiriva   Albuterol  inhaler or Duoneb As needed   Continue on Singulair  daily  Claritin  10mg  daily As needed   Activity as tolerated  Protonix  Twice daily   GERD diet  Labs today.  Follow up with Dr. Darlean  in 6-8 weeks with PFT and As needed   Please contact office for sooner follow up if symptoms do not improve or worsen or seek emergency care

## 2024-04-09 ENCOUNTER — Other Ambulatory Visit: Payer: Self-pay | Admitting: *Deleted

## 2024-04-09 MED ORDER — LOSARTAN POTASSIUM 25 MG PO TABS: 25.0000 mg | ORAL_TABLET | Freq: Every day | ORAL | 1 refills | Status: AC

## 2024-04-10 LAB — RESPIRATORY ALLERGY PANEL REGION II W/ RFLX: ~~LOC~~
Allergen, A. alternata, m6: <0.1 kU/L
Allergen, Cedar tree, t12: 0.64 kU/L — ABNORMAL HIGH
Allergen, Comm Silver Birch, t9: 0.28 kU/L — ABNORMAL HIGH
Allergen, Cottonwood, t14: 0.16 kU/L — ABNORMAL HIGH
Allergen, D pternoyssinus,d7: 0.13 kU/L — ABNORMAL HIGH
Allergen, Mouse Urine Protein, e78: <0.1 kU/L
Allergen, Mulberry, t76: <0.1 kU/L
Allergen, Oak,t7: 0.12 kU/L — ABNORMAL HIGH
Allergen, P. notatum, m1: <0.1 kU/L
Aspergillus fumigatus, m3: <0.1 kU/L
Bermuda Grass: 1.76 kU/L — ABNORMAL HIGH
Box Elder IgE: 0.31 kU/L — ABNORMAL HIGH
CLADOSPORIUM HERBARUM (M2) IGE: <0.1 kU/L
COMMON RAGWEED (SHORT) (W1) IGE: 0.28 kU/L — ABNORMAL HIGH
Cat Dander: <0.1 kU/L
Class: 0
Class: 0
Class: 0
Class: 0
Class: 0
Class: 0
Class: 0
Class: 0
Class: 1
Class: 2
Class: 2
Class: 2
Class: 3
Cockroach: 0.15 kU/L — ABNORMAL HIGH
D. farinae: 0.12 kU/L — ABNORMAL HIGH
Dog Dander: <0.1 kU/L
Elm IgE: 0.19 kU/L — ABNORMAL HIGH
IgE (Immunoglobulin E), Serum: 181 kU/L — ABNORMAL HIGH (ref <=114)
Johnson Grass: 1.48 kU/L — ABNORMAL HIGH
Pecan/Hickory Tree IgE: 1.77 kU/L — ABNORMAL HIGH
Rough Pigweed  IgE: 0.12 kU/L — ABNORMAL HIGH
Sheep Sorrel IgE: 0.12 kU/L — ABNORMAL HIGH
Timothy Grass: 5.85 kU/L — ABNORMAL HIGH

## 2024-04-10 LAB — INTERPRETATION:

## 2024-04-11 ENCOUNTER — Ambulatory Visit: Payer: Self-pay | Admitting: Adult Health

## 2024-04-22 ENCOUNTER — Other Ambulatory Visit: Payer: Self-pay | Admitting: Cardiology

## 2024-05-03 ENCOUNTER — Other Ambulatory Visit: Payer: Self-pay | Admitting: Adult Health

## 2024-05-05 MED ORDER — CARVEDILOL 25 MG PO TABS: 25.0000 mg | ORAL_TABLET | Freq: Two times a day (BID) | ORAL | 3 refills | Status: AC

## 2024-05-05 NOTE — Addendum Note (Signed)
 Addended by: BLUFORD, Jannet Calip L on: 05/05/2024 04:49 PM   Modules accepted: Orders

## 2024-05-06 ENCOUNTER — Other Ambulatory Visit: Payer: Self-pay | Admitting: Medical Genetics

## 2024-05-06 DIAGNOSIS — Z006 Encounter for examination for normal comparison and control in clinical research program: Secondary | ICD-10-CM

## 2024-05-19 ENCOUNTER — Other Ambulatory Visit: Payer: Self-pay | Admitting: Adult Health

## 2024-05-20 NOTE — Addendum Note (Signed)
 Addended by: PENNSTROM, JORDYN on: 05/20/2024 04:18 PM   Modules accepted: Orders

## 2024-05-21 ENCOUNTER — Ambulatory Visit: Attending: Cardiology | Admitting: Pharmacist

## 2024-05-21 ENCOUNTER — Other Ambulatory Visit (HOSPITAL_COMMUNITY): Payer: Self-pay

## 2024-05-21 ENCOUNTER — Telehealth: Payer: Self-pay | Admitting: Pharmacy Technician

## 2024-05-21 ENCOUNTER — Encounter: Payer: Self-pay | Admitting: Pharmacist

## 2024-05-21 DIAGNOSIS — E7849 Other hyperlipidemia: Secondary | ICD-10-CM

## 2024-05-21 DIAGNOSIS — I214 Non-ST elevation (NSTEMI) myocardial infarction: Secondary | ICD-10-CM

## 2024-05-21 MED ORDER — REPATHA SURECLICK 140 MG/ML ~~LOC~~ SOAJ
140.0000 mg | SUBCUTANEOUS | 3 refills | Status: AC
  Filled 2024-05-21: qty 6, 84d supply, fill #0

## 2024-05-21 NOTE — Assessment & Plan Note (Signed)
 Assessment:  LDL goal: < 55 mg/dl last LDLc 86 mg/dl (90/7974) Tolerates high intensity statins well without any side effects  Discussed next potential options (Zetia, PCSK-9 inhibitors, bempedoic acid and inclisiran); cost, dosing efficacy, side effects  Reiterated importance of heart healthy diet and moderate exercise   Plan: Continue taking current medications (Lipitor  80 mg daily) PA for Repatha  has been approved - enrolled in [facility] to make it affordable  Start taking Repatha  140 mg [place] every 14 days and follow up lab due on April 20,2026. Prescription for Repatha  sent to downstairs pharmacy and grant info shared)

## 2024-05-21 NOTE — Telephone Encounter (Signed)
 Patient Advocate Encounter   The patient was approved for a [employer] grant that will help cover the cost of repatha  Total amount awarded, 2500.00.  Effective: 04/21/24 - 04/20/25   APW:[id number] ERW:[id number] Hmnle:[id number] PI:[id number]  [employer] ID: [id number]   Pharmacy provided with approval and processing information. Informed rph

## 2024-05-21 NOTE — Progress Notes (Signed)
 Patient ID: [name]                 DOB: [birth year]                    MRN: [id number]      HPI: [name] is a 74 y.o. female patient referred to lipid clinic by Katlyn West, NP. PMH is significant for hx of NSTEMI and STEMI hx of pericarditis, hypertension, CAD, T2DM,, diabetes induced polyneuropathy, seizures disorder, Obesity.    The patient has been on high-intensity statin therapy for a long time; however, her LDL-C remains above goal (>29 mg/dL). She presented to the lipid clinic today and reports having had two myocardial infarctions to date. She has also had diabetes for over 30 years. The patient has been taking atorvastatin  80 mg daily for many years and reports excellent adherence. She denies experiencing any side effects from the medication. She also notes a strong family history of cardiovascular disease, with several family members having suffered heart attacks.  Reviewed options for lowering LDL cholesterol, including ezetimibe, PCSK-9 inhibitors, bempedoic acid and inclisiran.  Discussed mechanisms of action, dosing, side effects and potential decreases in LDL cholesterol.  Also reviewed cost information and potential options for patient assistance.  Current Medications: Lipitor  80 mg daily  Intolerances: none  Risk Factors: MI at age of 51,amily hx of CAD, hx of MI (x2), CAD, T2DM  LDL goal: <55 mg/dl or <29 mg/dl   Last lipid lab: 90/97/7974 TC 172, TG 97, HDLc 69, VLDLc 17, LDLc 86 Diet: breakfast -eggs, bacon, grits  Lunch: skips- sometime salads with store bought dressing  Dinner - fried fish eats out, backed potatoes, pork and beef frozen vegetables  Snack: chips, peccans, peanuts,  Drink: water, sweet tea - 1 glass  Admits her diet is not in best shape will work on making small changes - we discussed diet in depth and willing to make some changes and continue implement at slow pace.   Exercise: none due to neuropathy and Asthma related SOB    Family  History:  Relation Problem Comments  Mother (Deceased) Allergies   Asthma     Father (Deceased) CAD (Age: 64)   Diabetes   Hypertension     Sister (Deceased) Congestive Heart Failure died in her 53s    Sister - 2 Metallurgist) CAD (Age: 56)     Brother - 2 ([employer]ed Forces Training [employer] Education Officer)   Maternal Grandmother (Deceased)   Maternal Grandfather (Deceased)   Paternal Grandmother (Deceased)   Paternal Grandfather (Deceased)      Social History:  alcohol: none  Smoking: none   Labs:  Lipid Panel     Component Value Date/Time   CHOL 172 01/01/2024 0954   TRIG 97 01/01/2024 0954   HDL 69 01/01/2024 0954   CHOLHDL 2.5 01/01/2024 0954   CHOLHDL 2.7 08/12/2022 0035   VLDL 38 08/12/2022 0035   LDLCALC 86 01/01/2024 0954   LABVLDL 17 01/01/2024 0954    Past Medical History:  Diagnosis Date   Acute renal failure superimposed on stage 3b chronic kidney disease (HCC) 07/17/2016   Anxiety    Arthritis    Asthma    Chronic diastolic CHF (congestive heart failure) (HCC) 07/17/2016   Coronary artery disease    a. NSTEMI 12/2015 - LHC 01/18/16: s/p overlapping DESx2 to RCA, 10% ost-prox Cx,10% mLAD.   Dementia (HCC)    Depression    Diabetes mellitus    Dyslipidemia  GERD (gastroesophageal reflux disease)    History of seizure    Hyperlipidemia    Hypertension    Hypertensive heart disease    NSTEMI (non-ST elevated myocardial infarction) (HCC) 12/2015   Stage 3 chronic kidney disease (HCC)     Medications Ordered Prior to Encounter[1]  Allergies[2]  Assessment/Plan:  1. Hyperlipidemia -  Problem  Hyperlipidemia   Current Medications: Lipitor  80 mg daily, Repatha  140 gm every 14 days  Intolerances: none  Risk Factors: family hx of CAD, hx of MI (x2), CAD, T2DM   LDL goal: <55 mg/dl or <29 mg/dl     Hyperlipidemia Assessment:  LDL goal: < 55 mg/dl last LDLc 86 mg/dl (90/7974) Tolerates high intensity statins well without any side effects  Discussed next potential options (Zetia, PCSK-9  inhibitors, bempedoic acid and inclisiran); cost, dosing efficacy, side effects  Reiterated importance of heart healthy diet and moderate exercise   Plan: Continue taking current medications (Lipitor  80 mg daily) PA for Repatha  has been approved - enrolled in [facility] to make it affordable  Start taking Repatha  140 mg [place] every 14 days and follow up lab due on April 20,2026. Prescription for Repatha  sent to downstairs pharmacy and grant info shared)      Thank you,  Robbi Blanch, Pharm.D [facility]. [facility] [address], [place], [place] [address] Phone: [phone]; Fax: [phone]       [1]  Current Outpatient Medications on File Prior to Visit  Medication Sig Dispense Refill   albuterol  (PROAIR  HFA) 108 (90 Base) MCG/ACT inhaler Inhale 2 puffs into the lungs every 4 (four) hours as needed for wheezing or shortness of breath. 18 g 2   amLODipine  (NORVASC ) 10 MG tablet Take 1 tablet (10 mg total) by mouth daily. 90 tablet 3   aspirin  81 MG chewable tablet Chew 1 tablet (81 mg total) by mouth daily. 90 tablet 2   atorvastatin  (LIPITOR ) 80 MG tablet Take 1 tablet by mouth once daily 90 tablet 3   Biotin  5000 MCG CAPS Take 5,000 mcg by mouth daily.     carvedilol  (COREG ) 25 MG tablet Take 1 tablet (25 mg total) by mouth 2 (two) times daily. 180 tablet 3   chlorpheniramine -HYDROcodone  (TUSSIONEX) 10-8 MG/5ML Take 5 mLs by mouth every 12 (twelve) hours as needed.     divalproex  (DEPAKOTE  ER) 250 MG 24 hr tablet Take 1 tablet (250 mg total) by mouth at bedtime. To be taken with the Depakote  500 mg for a total of 750 mg at bedtime 90 tablet 3   divalproex  (DEPAKOTE  ER) 500 MG 24 hr tablet Take 1 tablet (500 mg total) by mouth at bedtime. 90 tablet 3   empagliflozin  ([name] ) 10 MG TABS tablet Take 1 tablet by mouth once daily 90 tablet 3   famotidine  (PEPCID ) 20 MG tablet Take 20 mg by mouth at bedtime as needed. (Patient not taking:  Reported on 04/08/2024)     ferrous sulfate  325 (65 FE) MG tablet Take 325 mg by mouth daily with breakfast.     fluticasone  (FLONASE ) 50 MCG/ACT nasal spray Place 1 spray into both nostrils daily. (Patient taking differently: Place 1 spray into both nostrils as needed.) 16 g 2   fluticasone -salmeterol (ADVAIR HFA) 115-21 MCG/ACT inhaler Inhale 2 puffs into the lungs 2 (two) times daily. 1 each 5   ipratropium-albuterol  (DUONEB) 0.5-2.5 (3) MG/3ML SOLN Take 3 mLs by nebulization every 4 (four) hours as  needed. 360 mL 1   isosorbide  mononitrate (IMDUR ) 30 MG 24 hr tablet TAKE 1 TABLET EVERY DAY 90 tablet 2   latanoprost  (XALATAN ) 0.005 % ophthalmic solution Place 1 drop into both eyes at bedtime.     losartan  (COZAAR ) 25 MG tablet Take 1 tablet (25 mg total) by mouth daily. 90 tablet 1   magnesium  oxide (MAG-OX) 400 (240 Mg) MG tablet Take 1 tablet (400 mg total) by mouth daily. 60 tablet 0   memantine  (NAMENDA ) 10 MG tablet Take 1 tablet (10 mg total) by mouth 2 (two) times daily. 180 tablet 3   metFORMIN  (GLUCOPHAGE ) 1000 MG tablet Take 1,000 mg by mouth 2 (two) times daily with a meal.     montelukast  (SINGULAIR ) 10 MG tablet TAKE 1 TABLET BY MOUTH AT  BEDTIME 90 tablet 3   Multiple Vitamins-Minerals (ONE-A-DAY WOMENS PO) Take 1 tablet by mouth daily with breakfast.     Nebulizers (COMPRESSOR/NEBULIZER) MISC 1 each by Does not apply route every 4 (four) hours as needed. 1 each 0   nitroGLYCERIN  (NITROSTAT ) 0.4 MG SL tablet Place 1 tablet (0.4 mg total) under the tongue every 5 (five) minutes as needed for chest pain. Don't exceed more than 3 doses in 15 mins 30 tablet 0   pantoprazole  (PROTONIX ) 40 MG tablet Take 1 tablet (40 mg total) by mouth 2 (two) times daily before a meal. 60 tablet 0   Probiotic Product (EQ PROBIOTIC) CAPS Take 1 capsule by mouth daily.     tiZANidine  (ZANAFLEX ) 2 MG tablet Take 2 mg by mouth every 8 (eight) hours as needed.     traZODone  (DESYREL ) 50 MG tablet Take 50 mg  by mouth at bedtime as needed.     No current facility-administered medications on file prior to visit.  [2]  Allergies Allergen Reactions   Tramadol  Other (See Comments)    Not to be taken because of existing seizure disorder/SEIZURES    Chlorthalidone  Other (See Comments)    Was stopped due to kidney function

## 2024-05-22 ENCOUNTER — Telehealth: Payer: Self-pay | Admitting: Adult Health

## 2024-05-22 ENCOUNTER — Other Ambulatory Visit (HOSPITAL_COMMUNITY): Payer: Self-pay

## 2024-05-22 MED ORDER — ISOSORBIDE MONONITRATE ER 30 MG PO TB24: 30.0000 mg | ORAL_TABLET | Freq: Every day | ORAL | 2 refills | Status: AC

## 2024-05-22 NOTE — Telephone Encounter (Signed)
" °*  STAT* If patient is at the pharmacy, call can be transferred to refill team.   1. Which medications need to be refilled? (please list name of each medication and dose if known)   isosorbide  mononitrate (IMDUR ) 30 MG 24 hr tablet    2. Would you like to learn more about the convenience, safety, & potential cost savings by using the [facility]? no   3. Are you open to using the [facility] (Type [facility]. no   4. Which pharmacy/location (including street and city if local pharmacy) is medication to be sent to?   [id number] - [place], [place] - [address]    5. Do they need a 30 day or 90 day supply? 90 day     "

## 2024-05-24 ENCOUNTER — Other Ambulatory Visit: Payer: Self-pay | Admitting: Internal Medicine

## 2024-05-24 DIAGNOSIS — J4550 Severe persistent asthma, uncomplicated: Secondary | ICD-10-CM

## 2024-05-24 DIAGNOSIS — J301 Allergic rhinitis due to pollen: Secondary | ICD-10-CM

## 2024-05-27 NOTE — Telephone Encounter (Signed)
 Refill was sent 05/22/24.

## 2024-05-30 LAB — LIPID PANEL
Chol/HDL Ratio: 2.2 ratio (ref 0.0–4.4)
Cholesterol, Total: 162 mg/dL (ref 100–199)
HDL: 75 mg/dL
LDL Chol Calc (NIH): 67 mg/dL (ref 0–99)
Triglycerides: 114 mg/dL (ref 0–149)
VLDL Cholesterol Cal: 20 mg/dL (ref 5–40)

## 2024-06-02 ENCOUNTER — Ambulatory Visit: Payer: Self-pay | Admitting: Pharmacist

## 2024-06-04 MED ORDER — ATORVASTATIN CALCIUM 40 MG PO TABS: 40.0000 mg | ORAL_TABLET | Freq: Every day | ORAL | 3 refills | Status: AC

## 2024-06-09 ENCOUNTER — Encounter

## 2024-06-09 ENCOUNTER — Ambulatory Visit: Admitting: Internal Medicine

## 2024-06-09 DIAGNOSIS — J4550 Severe persistent asthma, uncomplicated: Secondary | ICD-10-CM

## 2024-09-08 ENCOUNTER — Ambulatory Visit: Admitting: Neurology
